# Patient Record
Sex: Female | Born: 1935 | Race: White | Hispanic: No | Smoking: Never smoker
Health system: Southern US, Community
[De-identification: ages and names within clinical notes are randomized; demographics above are authoritative.]

## PROBLEM LIST (undated history)

## (undated) DIAGNOSIS — E785 Hyperlipidemia, unspecified: Secondary | ICD-10-CM

## (undated) DIAGNOSIS — G2581 Restless legs syndrome: Secondary | ICD-10-CM

## (undated) DIAGNOSIS — I10 Essential (primary) hypertension: Secondary | ICD-10-CM

## (undated) DIAGNOSIS — Z96659 Presence of unspecified artificial knee joint: Secondary | ICD-10-CM

## (undated) DIAGNOSIS — E042 Nontoxic multinodular goiter: Secondary | ICD-10-CM

## (undated) DIAGNOSIS — I639 Cerebral infarction, unspecified: Secondary | ICD-10-CM

## (undated) DIAGNOSIS — I209 Angina pectoris, unspecified: Secondary | ICD-10-CM

## (undated) DIAGNOSIS — Z87898 Personal history of other specified conditions: Secondary | ICD-10-CM

## (undated) DIAGNOSIS — M8430XA Stress fracture, unspecified site, initial encounter for fracture: Secondary | ICD-10-CM

## (undated) DIAGNOSIS — R251 Tremor, unspecified: Secondary | ICD-10-CM

## (undated) DIAGNOSIS — K5792 Diverticulitis of intestine, part unspecified, without perforation or abscess without bleeding: Secondary | ICD-10-CM

## (undated) DIAGNOSIS — N8189 Other female genital prolapse: Secondary | ICD-10-CM

## (undated) DIAGNOSIS — L309 Dermatitis, unspecified: Secondary | ICD-10-CM

## (undated) DIAGNOSIS — M1710 Unilateral primary osteoarthritis, unspecified knee: Secondary | ICD-10-CM

## (undated) DIAGNOSIS — M7630 Iliotibial band syndrome, unspecified leg: Secondary | ICD-10-CM

## (undated) DIAGNOSIS — R7303 Prediabetes: Secondary | ICD-10-CM

## (undated) DIAGNOSIS — R5383 Other fatigue: Secondary | ICD-10-CM

## (undated) DIAGNOSIS — E119 Type 2 diabetes mellitus without complications: Secondary | ICD-10-CM

## (undated) DIAGNOSIS — R0600 Dyspnea, unspecified: Secondary | ICD-10-CM

## (undated) DIAGNOSIS — M25559 Pain in unspecified hip: Secondary | ICD-10-CM

## (undated) DIAGNOSIS — R51 Headache: Secondary | ICD-10-CM

## (undated) DIAGNOSIS — Z8711 Personal history of peptic ulcer disease: Secondary | ICD-10-CM

## (undated) DIAGNOSIS — F418 Other specified anxiety disorders: Secondary | ICD-10-CM

## (undated) DIAGNOSIS — M810 Age-related osteoporosis without current pathological fracture: Secondary | ICD-10-CM

## (undated) DIAGNOSIS — K449 Diaphragmatic hernia without obstruction or gangrene: Secondary | ICD-10-CM

## (undated) DIAGNOSIS — G4700 Insomnia, unspecified: Secondary | ICD-10-CM

## (undated) DIAGNOSIS — I499 Cardiac arrhythmia, unspecified: Secondary | ICD-10-CM

## (undated) DIAGNOSIS — I4891 Unspecified atrial fibrillation: Secondary | ICD-10-CM

## (undated) DIAGNOSIS — Z9889 Other specified postprocedural states: Secondary | ICD-10-CM

## (undated) DIAGNOSIS — N393 Stress incontinence (female) (male): Secondary | ICD-10-CM

## (undated) DIAGNOSIS — I82409 Acute embolism and thrombosis of unspecified deep veins of unspecified lower extremity: Secondary | ICD-10-CM

## (undated) DIAGNOSIS — Z8719 Personal history of other diseases of the digestive system: Secondary | ICD-10-CM

## (undated) DIAGNOSIS — R002 Palpitations: Secondary | ICD-10-CM

## (undated) DIAGNOSIS — H269 Unspecified cataract: Secondary | ICD-10-CM

## (undated) DIAGNOSIS — I2089 Other forms of angina pectoris: Secondary | ICD-10-CM

## (undated) DIAGNOSIS — N390 Urinary tract infection, site not specified: Secondary | ICD-10-CM

## (undated) DIAGNOSIS — C801 Malignant (primary) neoplasm, unspecified: Secondary | ICD-10-CM

## (undated) DIAGNOSIS — R339 Retention of urine, unspecified: Secondary | ICD-10-CM

## (undated) DIAGNOSIS — M1990 Unspecified osteoarthritis, unspecified site: Secondary | ICD-10-CM

## (undated) DIAGNOSIS — I2510 Atherosclerotic heart disease of native coronary artery without angina pectoris: Secondary | ICD-10-CM

## (undated) DIAGNOSIS — G43909 Migraine, unspecified, not intractable, without status migrainosus: Secondary | ICD-10-CM

## (undated) DIAGNOSIS — R197 Diarrhea, unspecified: Secondary | ICD-10-CM

## (undated) DIAGNOSIS — E1169 Type 2 diabetes mellitus with other specified complication: Secondary | ICD-10-CM

## (undated) DIAGNOSIS — I452 Bifascicular block: Secondary | ICD-10-CM

## (undated) DIAGNOSIS — Z9289 Personal history of other medical treatment: Secondary | ICD-10-CM

## (undated) DIAGNOSIS — K219 Gastro-esophageal reflux disease without esophagitis: Secondary | ICD-10-CM

## (undated) DIAGNOSIS — E782 Mixed hyperlipidemia: Secondary | ICD-10-CM

## (undated) DIAGNOSIS — I5032 Chronic diastolic (congestive) heart failure: Secondary | ICD-10-CM

## (undated) DIAGNOSIS — Z8541 Personal history of malignant neoplasm of cervix uteri: Secondary | ICD-10-CM

## (undated) DIAGNOSIS — I208 Other forms of angina pectoris: Secondary | ICD-10-CM

## (undated) DIAGNOSIS — N3946 Mixed incontinence: Secondary | ICD-10-CM

## (undated) DIAGNOSIS — R0609 Other forms of dyspnea: Secondary | ICD-10-CM

## (undated) DIAGNOSIS — R600 Localized edema: Secondary | ICD-10-CM

## (undated) DIAGNOSIS — Z0000 Encounter for general adult medical examination without abnormal findings: Secondary | ICD-10-CM

## (undated) DIAGNOSIS — I509 Heart failure, unspecified: Secondary | ICD-10-CM

## (undated) HISTORY — DX: Unilateral primary osteoarthritis, unspecified knee: M17.10

## (undated) HISTORY — DX: Encounter for general adult medical examination without abnormal findings: Z00.00

## (undated) HISTORY — DX: Headache: R51

## (undated) HISTORY — PX: JOINT REPLACEMENT: SHX530

## (undated) HISTORY — DX: Malignant (primary) neoplasm, unspecified: C80.1

## (undated) HISTORY — PX: UPPER GASTROINTESTINAL ENDOSCOPY: SHX188

## (undated) HISTORY — DX: Chronic diastolic (congestive) heart failure: I50.32

## (undated) HISTORY — DX: Acute embolism and thrombosis of unspecified deep veins of unspecified lower extremity: I82.409

## (undated) HISTORY — DX: Diarrhea, unspecified: R19.7

## (undated) HISTORY — DX: Insomnia, unspecified: G47.00

## (undated) HISTORY — DX: Age-related osteoporosis without current pathological fracture: M81.0

## (undated) HISTORY — DX: Other specified postprocedural states: Z98.890

## (undated) HISTORY — DX: Heart failure, unspecified: I50.9

## (undated) HISTORY — DX: Atherosclerotic heart disease of native coronary artery without angina pectoris: I25.10

## (undated) HISTORY — DX: Urinary tract infection, site not specified: N39.0

## (undated) HISTORY — PX: BRAIN SURGERY: SHX531

## (undated) HISTORY — DX: Restless legs syndrome: G25.81

## (undated) HISTORY — DX: Other specified anxiety disorders: F41.8

## (undated) HISTORY — PX: COLONOSCOPY: SHX174

## (undated) HISTORY — DX: Presence of unspecified artificial knee joint: Z96.659

## (undated) HISTORY — PX: KNEE ARTHROSCOPY: SUR90

## (undated) HISTORY — PX: FOOT SURGERY: SHX648

## (undated) HISTORY — DX: Type 2 diabetes mellitus with other specified complication: E11.69

## (undated) HISTORY — PX: CORONARY ANGIOPLASTY: SHX604

## (undated) HISTORY — DX: Pain in unspecified hip: M25.559

## (undated) HISTORY — PX: EYE SURGERY: SHX253

## (undated) HISTORY — PX: ABDOMINAL HYSTERECTOMY: SHX81

## (undated) HISTORY — PX: HERNIA REPAIR: SHX51

## (undated) HISTORY — DX: Dermatitis, unspecified: L30.9

## (undated) HISTORY — DX: Personal history of other diseases of the digestive system: Z87.19

## (undated) HISTORY — PX: FRACTURE SURGERY: SHX138

## (undated) HISTORY — DX: Unspecified cataract: H26.9

## (undated) HISTORY — DX: Hyperlipidemia, unspecified: E78.5

## (undated) HISTORY — DX: Stress fracture, unspecified site, initial encounter for fracture: M84.30XA

## (undated) HISTORY — PX: SMALL INTESTINE SURGERY: SHX150

## (undated) HISTORY — PX: CORONARY ANGIOPLASTY WITH STENT PLACEMENT: SHX49

## (undated) HISTORY — DX: Gastro-esophageal reflux disease without esophagitis: K21.9

## (undated) HISTORY — DX: Other fatigue: R53.83

## (undated) HISTORY — DX: Essential (primary) hypertension: I10

---

## 1986-07-09 HISTORY — PX: VAGINAL HYSTERECTOMY: SUR661

## 1987-07-10 HISTORY — PX: HIATAL HERNIA REPAIR: SHX195

## 1997-10-13 ENCOUNTER — Other Ambulatory Visit: Admission: RE | Admit: 1997-10-13 | Discharge: 1997-10-13 | Payer: Self-pay | Admitting: Obstetrics and Gynecology

## 1998-07-09 DIAGNOSIS — I639 Cerebral infarction, unspecified: Secondary | ICD-10-CM

## 1998-07-09 HISTORY — DX: Cerebral infarction, unspecified: I63.9

## 1999-03-21 ENCOUNTER — Ambulatory Visit (HOSPITAL_BASED_OUTPATIENT_CLINIC_OR_DEPARTMENT_OTHER): Admission: RE | Admit: 1999-03-21 | Discharge: 1999-03-21 | Payer: Self-pay | Admitting: Orthopedic Surgery

## 1999-06-09 DIAGNOSIS — Z8673 Personal history of transient ischemic attack (TIA), and cerebral infarction without residual deficits: Secondary | ICD-10-CM

## 1999-06-09 HISTORY — DX: Personal history of transient ischemic attack (TIA), and cerebral infarction without residual deficits: Z86.73

## 1999-06-21 ENCOUNTER — Encounter: Payer: Self-pay | Admitting: Emergency Medicine

## 1999-06-21 ENCOUNTER — Encounter: Payer: Self-pay | Admitting: Neurology

## 1999-06-21 ENCOUNTER — Inpatient Hospital Stay (HOSPITAL_COMMUNITY): Admission: EM | Admit: 1999-06-21 | Discharge: 1999-06-24 | Payer: Self-pay | Admitting: Emergency Medicine

## 1999-06-22 ENCOUNTER — Encounter: Payer: Self-pay | Admitting: Neurology

## 1999-08-14 ENCOUNTER — Ambulatory Visit (HOSPITAL_COMMUNITY): Admission: RE | Admit: 1999-08-14 | Discharge: 1999-08-14 | Payer: Self-pay | Admitting: Neurology

## 1999-08-14 ENCOUNTER — Encounter: Payer: Self-pay | Admitting: Neurology

## 1999-08-15 ENCOUNTER — Ambulatory Visit (HOSPITAL_COMMUNITY): Admission: RE | Admit: 1999-08-15 | Discharge: 1999-08-16 | Payer: Self-pay | Admitting: Neurology

## 1999-08-15 ENCOUNTER — Encounter: Payer: Self-pay | Admitting: Neurology

## 1999-12-27 ENCOUNTER — Other Ambulatory Visit: Admission: RE | Admit: 1999-12-27 | Discharge: 1999-12-27 | Payer: Self-pay | Admitting: Obstetrics and Gynecology

## 1999-12-29 ENCOUNTER — Encounter: Payer: Self-pay | Admitting: Obstetrics and Gynecology

## 1999-12-29 ENCOUNTER — Encounter: Admission: RE | Admit: 1999-12-29 | Discharge: 1999-12-29 | Payer: Self-pay | Admitting: Obstetrics and Gynecology

## 2000-01-03 ENCOUNTER — Encounter: Payer: Self-pay | Admitting: Obstetrics and Gynecology

## 2000-01-03 ENCOUNTER — Encounter: Admission: RE | Admit: 2000-01-03 | Discharge: 2000-01-03 | Payer: Self-pay | Admitting: Obstetrics and Gynecology

## 2000-03-21 ENCOUNTER — Ambulatory Visit (HOSPITAL_COMMUNITY): Admission: RE | Admit: 2000-03-21 | Discharge: 2000-03-21 | Payer: Self-pay | Admitting: Gastroenterology

## 2000-03-21 ENCOUNTER — Encounter: Payer: Self-pay | Admitting: Gastroenterology

## 2000-04-01 ENCOUNTER — Encounter: Payer: Self-pay | Admitting: Gastroenterology

## 2000-04-01 ENCOUNTER — Ambulatory Visit (HOSPITAL_COMMUNITY): Admission: RE | Admit: 2000-04-01 | Discharge: 2000-04-01 | Payer: Self-pay | Admitting: Gastroenterology

## 2000-05-27 ENCOUNTER — Ambulatory Visit (HOSPITAL_COMMUNITY): Admission: RE | Admit: 2000-05-27 | Discharge: 2000-05-27 | Payer: Self-pay | Admitting: Gastroenterology

## 2000-05-27 ENCOUNTER — Encounter: Payer: Self-pay | Admitting: Gastroenterology

## 2001-02-04 ENCOUNTER — Encounter: Admission: RE | Admit: 2001-02-04 | Discharge: 2001-02-04 | Payer: Self-pay | Admitting: Obstetrics and Gynecology

## 2001-02-04 ENCOUNTER — Encounter: Payer: Self-pay | Admitting: Obstetrics and Gynecology

## 2001-02-26 ENCOUNTER — Other Ambulatory Visit: Admission: RE | Admit: 2001-02-26 | Discharge: 2001-02-26 | Payer: Self-pay | Admitting: Obstetrics and Gynecology

## 2002-03-17 ENCOUNTER — Other Ambulatory Visit: Admission: RE | Admit: 2002-03-17 | Discharge: 2002-03-17 | Payer: Self-pay | Admitting: Gynecology

## 2002-03-27 ENCOUNTER — Encounter: Payer: Self-pay | Admitting: Gynecology

## 2002-03-27 ENCOUNTER — Encounter: Admission: RE | Admit: 2002-03-27 | Discharge: 2002-03-27 | Payer: Self-pay | Admitting: Gynecology

## 2002-12-18 ENCOUNTER — Encounter: Admission: RE | Admit: 2002-12-18 | Discharge: 2002-12-18 | Payer: Self-pay | Admitting: Family Medicine

## 2002-12-18 ENCOUNTER — Encounter: Payer: Self-pay | Admitting: Family Medicine

## 2003-04-09 ENCOUNTER — Encounter: Admission: RE | Admit: 2003-04-09 | Discharge: 2003-04-09 | Payer: Self-pay | Admitting: Gynecology

## 2003-04-09 ENCOUNTER — Encounter: Payer: Self-pay | Admitting: Gynecology

## 2003-04-10 ENCOUNTER — Ambulatory Visit (HOSPITAL_COMMUNITY): Admission: RE | Admit: 2003-04-10 | Discharge: 2003-04-10 | Payer: Self-pay | Admitting: Neurology

## 2003-04-10 ENCOUNTER — Encounter: Payer: Self-pay | Admitting: Neurology

## 2003-04-12 ENCOUNTER — Other Ambulatory Visit: Admission: RE | Admit: 2003-04-12 | Discharge: 2003-04-12 | Payer: Self-pay

## 2003-04-29 ENCOUNTER — Encounter: Admission: RE | Admit: 2003-04-29 | Discharge: 2003-05-24 | Payer: Self-pay | Admitting: Neurology

## 2004-07-09 DIAGNOSIS — Z955 Presence of coronary angioplasty implant and graft: Secondary | ICD-10-CM

## 2004-07-09 HISTORY — DX: Presence of coronary angioplasty implant and graft: Z95.5

## 2004-11-06 ENCOUNTER — Ambulatory Visit: Payer: Self-pay | Admitting: Internal Medicine

## 2005-01-24 ENCOUNTER — Other Ambulatory Visit: Admission: RE | Admit: 2005-01-24 | Discharge: 2005-01-24 | Payer: Self-pay | Admitting: Gynecology

## 2005-02-02 ENCOUNTER — Encounter: Admission: RE | Admit: 2005-02-02 | Discharge: 2005-02-02 | Payer: Self-pay | Admitting: Gynecology

## 2005-03-15 ENCOUNTER — Ambulatory Visit: Payer: Self-pay | Admitting: Internal Medicine

## 2005-03-16 ENCOUNTER — Inpatient Hospital Stay (HOSPITAL_COMMUNITY): Admission: EM | Admit: 2005-03-16 | Discharge: 2005-03-19 | Payer: Self-pay | Admitting: Emergency Medicine

## 2005-03-16 ENCOUNTER — Ambulatory Visit: Payer: Self-pay | Admitting: Cardiology

## 2005-03-28 ENCOUNTER — Ambulatory Visit: Payer: Self-pay | Admitting: Family Medicine

## 2005-04-06 ENCOUNTER — Ambulatory Visit: Payer: Self-pay | Admitting: Cardiology

## 2005-04-25 ENCOUNTER — Ambulatory Visit: Payer: Self-pay

## 2005-05-22 ENCOUNTER — Other Ambulatory Visit: Admission: RE | Admit: 2005-05-22 | Discharge: 2005-05-22 | Payer: Self-pay | Admitting: Gynecology

## 2005-06-19 ENCOUNTER — Ambulatory Visit: Payer: Self-pay | Admitting: Family Medicine

## 2005-06-26 ENCOUNTER — Ambulatory Visit: Payer: Self-pay | Admitting: *Deleted

## 2005-06-26 ENCOUNTER — Inpatient Hospital Stay (HOSPITAL_COMMUNITY): Admission: EM | Admit: 2005-06-26 | Discharge: 2005-06-28 | Payer: Self-pay | Admitting: Emergency Medicine

## 2005-07-12 ENCOUNTER — Ambulatory Visit: Payer: Self-pay | Admitting: Cardiology

## 2005-07-12 ENCOUNTER — Encounter (HOSPITAL_COMMUNITY): Admission: RE | Admit: 2005-07-12 | Discharge: 2005-10-10 | Payer: Self-pay | Admitting: Cardiology

## 2005-07-27 ENCOUNTER — Ambulatory Visit: Payer: Self-pay | Admitting: Cardiology

## 2005-09-03 ENCOUNTER — Ambulatory Visit: Payer: Self-pay | Admitting: Cardiology

## 2005-09-09 ENCOUNTER — Encounter: Admission: RE | Admit: 2005-09-09 | Discharge: 2005-09-09 | Payer: Self-pay | Admitting: Neurology

## 2005-10-11 ENCOUNTER — Encounter (HOSPITAL_COMMUNITY): Admission: RE | Admit: 2005-10-11 | Discharge: 2006-01-09 | Payer: Self-pay | Admitting: Cardiology

## 2005-10-23 ENCOUNTER — Ambulatory Visit: Payer: Self-pay | Admitting: Cardiology

## 2005-11-19 ENCOUNTER — Other Ambulatory Visit: Admission: RE | Admit: 2005-11-19 | Discharge: 2005-11-19 | Payer: Self-pay | Admitting: Gynecology

## 2006-04-01 ENCOUNTER — Encounter: Admission: RE | Admit: 2006-04-01 | Discharge: 2006-04-01 | Payer: Self-pay | Admitting: Gynecology

## 2006-04-08 ENCOUNTER — Ambulatory Visit: Payer: Self-pay | Admitting: Cardiology

## 2006-04-11 ENCOUNTER — Ambulatory Visit: Payer: Self-pay | Admitting: Cardiology

## 2006-04-23 ENCOUNTER — Ambulatory Visit: Payer: Self-pay | Admitting: Family Medicine

## 2006-05-07 ENCOUNTER — Other Ambulatory Visit: Admission: RE | Admit: 2006-05-07 | Discharge: 2006-05-07 | Payer: Self-pay | Admitting: Gynecology

## 2006-06-17 ENCOUNTER — Ambulatory Visit: Payer: Self-pay | Admitting: Cardiology

## 2006-10-28 ENCOUNTER — Ambulatory Visit: Payer: Self-pay | Admitting: Cardiology

## 2006-12-12 ENCOUNTER — Ambulatory Visit: Payer: Self-pay | Admitting: Cardiology

## 2006-12-12 LAB — CONVERTED CEMR LAB
BUN: 18 mg/dL (ref 6–23)
Calcium: 9.3 mg/dL (ref 8.4–10.5)
Chloride: 104 meq/L (ref 96–112)
Creatinine, Ser: 1 mg/dL (ref 0.4–1.2)
GFR calc non Af Amer: 58 mL/min
Magnesium: 1.5 mg/dL (ref 1.5–2.5)
TSH: 1.28 microintl units/mL (ref 0.35–5.50)

## 2007-04-14 DIAGNOSIS — I10 Essential (primary) hypertension: Secondary | ICD-10-CM | POA: Insufficient documentation

## 2007-04-14 DIAGNOSIS — K219 Gastro-esophageal reflux disease without esophagitis: Secondary | ICD-10-CM | POA: Insufficient documentation

## 2007-04-14 DIAGNOSIS — E785 Hyperlipidemia, unspecified: Secondary | ICD-10-CM | POA: Insufficient documentation

## 2007-04-14 DIAGNOSIS — Z8679 Personal history of other diseases of the circulatory system: Secondary | ICD-10-CM | POA: Insufficient documentation

## 2007-04-28 ENCOUNTER — Encounter: Admission: RE | Admit: 2007-04-28 | Discharge: 2007-04-28 | Payer: Self-pay | Admitting: Gynecology

## 2007-05-09 ENCOUNTER — Encounter: Payer: Self-pay | Admitting: Family Medicine

## 2007-05-12 ENCOUNTER — Other Ambulatory Visit: Admission: RE | Admit: 2007-05-12 | Discharge: 2007-05-12 | Payer: Self-pay | Admitting: Gynecology

## 2007-06-11 ENCOUNTER — Ambulatory Visit: Payer: Self-pay | Admitting: Cardiology

## 2007-10-16 ENCOUNTER — Ambulatory Visit: Payer: Self-pay | Admitting: Family Medicine

## 2007-10-16 DIAGNOSIS — R002 Palpitations: Secondary | ICD-10-CM | POA: Insufficient documentation

## 2007-10-16 DIAGNOSIS — L259 Unspecified contact dermatitis, unspecified cause: Secondary | ICD-10-CM | POA: Insufficient documentation

## 2007-10-16 DIAGNOSIS — R609 Edema, unspecified: Secondary | ICD-10-CM | POA: Insufficient documentation

## 2007-12-09 ENCOUNTER — Ambulatory Visit: Payer: Self-pay | Admitting: Cardiology

## 2007-12-17 ENCOUNTER — Encounter: Payer: Self-pay | Admitting: Family Medicine

## 2007-12-17 ENCOUNTER — Ambulatory Visit: Payer: Self-pay

## 2007-12-27 ENCOUNTER — Emergency Department (HOSPITAL_COMMUNITY): Admission: EM | Admit: 2007-12-27 | Discharge: 2007-12-27 | Payer: Self-pay | Admitting: Emergency Medicine

## 2007-12-27 ENCOUNTER — Telehealth: Payer: Self-pay | Admitting: Internal Medicine

## 2008-04-29 ENCOUNTER — Encounter: Admission: RE | Admit: 2008-04-29 | Discharge: 2008-04-29 | Payer: Self-pay | Admitting: Gynecology

## 2008-05-07 ENCOUNTER — Encounter: Payer: Self-pay | Admitting: Family Medicine

## 2008-07-09 DIAGNOSIS — Z87898 Personal history of other specified conditions: Secondary | ICD-10-CM

## 2008-07-09 HISTORY — DX: Personal history of other specified conditions: Z87.898

## 2008-08-02 ENCOUNTER — Ambulatory Visit: Payer: Self-pay | Admitting: Cardiovascular Disease

## 2008-08-03 ENCOUNTER — Ambulatory Visit: Payer: Self-pay | Admitting: Cardiovascular Disease

## 2008-08-04 ENCOUNTER — Ambulatory Visit: Payer: Self-pay | Admitting: Cardiovascular Disease

## 2008-08-04 LAB — CONVERTED CEMR LAB
ALT: 24 units/L (ref 0–35)
AST: 21 units/L (ref 0–37)
Direct LDL: 135.4 mg/dL
HDL: 48.8 mg/dL (ref >39.0)
Total Protein: 6.5 g/dL (ref 6.0–8.3)
Triglycerides: 134 mg/dL (ref 0–149)

## 2008-08-24 ENCOUNTER — Ambulatory Visit: Payer: Self-pay | Admitting: Pulmonary Disease

## 2008-09-09 DIAGNOSIS — G4700 Insomnia, unspecified: Secondary | ICD-10-CM | POA: Insufficient documentation

## 2008-09-14 ENCOUNTER — Ambulatory Visit: Payer: Self-pay | Admitting: Pulmonary Disease

## 2008-11-02 ENCOUNTER — Telehealth: Payer: Self-pay | Admitting: Cardiovascular Disease

## 2009-01-18 DIAGNOSIS — R5381 Other malaise: Secondary | ICD-10-CM | POA: Insufficient documentation

## 2009-01-18 DIAGNOSIS — G2581 Restless legs syndrome: Secondary | ICD-10-CM | POA: Insufficient documentation

## 2009-01-18 DIAGNOSIS — I2510 Atherosclerotic heart disease of native coronary artery without angina pectoris: Secondary | ICD-10-CM | POA: Insufficient documentation

## 2009-01-18 DIAGNOSIS — R5383 Other fatigue: Secondary | ICD-10-CM

## 2009-01-24 ENCOUNTER — Ambulatory Visit: Payer: Self-pay | Admitting: Cardiovascular Disease

## 2009-01-26 LAB — CONVERTED CEMR LAB
ALT: 24 units/L (ref 0–35)
Alkaline Phosphatase: 83 units/L (ref 39–117)
Bilirubin, Direct: 0.1 mg/dL (ref 0.0–0.3)
Cholesterol: 139 mg/dL (ref 0–200)
LDL Cholesterol: 63 mg/dL (ref 0–99)
Total Bilirubin: 1 mg/dL (ref 0.3–1.2)
Total Protein: 6.9 g/dL (ref 6.0–8.3)
VLDL: 27.4 mg/dL (ref 0.0–40.0)

## 2009-04-15 ENCOUNTER — Encounter (INDEPENDENT_AMBULATORY_CARE_PROVIDER_SITE_OTHER): Payer: Self-pay | Admitting: *Deleted

## 2009-05-02 ENCOUNTER — Encounter: Admission: RE | Admit: 2009-05-02 | Discharge: 2009-05-02 | Payer: Self-pay | Admitting: Gynecology

## 2009-05-13 ENCOUNTER — Encounter (INDEPENDENT_AMBULATORY_CARE_PROVIDER_SITE_OTHER): Payer: Self-pay | Admitting: *Deleted

## 2009-06-27 ENCOUNTER — Ambulatory Visit: Payer: Self-pay | Admitting: Cardiovascular Disease

## 2009-06-27 ENCOUNTER — Telehealth: Payer: Self-pay | Admitting: Cardiovascular Disease

## 2009-06-29 LAB — CONVERTED CEMR LAB
BUN: 26 mg/dL — ABNORMAL HIGH (ref 6–23)
Chloride: 103 meq/L (ref 96–112)
Creatinine, Ser: 1.2 mg/dL (ref 0.4–1.2)
Glucose, Bld: 155 mg/dL — ABNORMAL HIGH (ref 70–99)
Magnesium: 1.3 mg/dL — ABNORMAL LOW (ref 1.5–2.5)
Potassium: 4.1 meq/L (ref 3.5–5.1)

## 2009-07-07 ENCOUNTER — Ambulatory Visit: Payer: Self-pay | Admitting: Cardiovascular Disease

## 2009-07-07 ENCOUNTER — Encounter: Payer: Self-pay | Admitting: Cardiology

## 2009-07-11 ENCOUNTER — Telehealth: Payer: Self-pay | Admitting: Cardiovascular Disease

## 2009-07-13 ENCOUNTER — Ambulatory Visit: Payer: Self-pay | Admitting: Family Medicine

## 2009-07-13 DIAGNOSIS — J209 Acute bronchitis, unspecified: Secondary | ICD-10-CM | POA: Insufficient documentation

## 2009-07-13 DIAGNOSIS — J069 Acute upper respiratory infection, unspecified: Secondary | ICD-10-CM | POA: Insufficient documentation

## 2009-08-01 ENCOUNTER — Ambulatory Visit: Payer: Self-pay | Admitting: Cardiovascular Disease

## 2009-08-02 ENCOUNTER — Telehealth (INDEPENDENT_AMBULATORY_CARE_PROVIDER_SITE_OTHER): Payer: Self-pay | Admitting: *Deleted

## 2009-08-03 ENCOUNTER — Ambulatory Visit: Payer: Self-pay

## 2009-08-03 ENCOUNTER — Ambulatory Visit: Payer: Self-pay | Admitting: Cardiology

## 2009-08-03 ENCOUNTER — Encounter (HOSPITAL_COMMUNITY): Admission: RE | Admit: 2009-08-03 | Discharge: 2009-10-12 | Payer: Self-pay | Admitting: Obstetrics and Gynecology

## 2009-08-04 LAB — CONVERTED CEMR LAB
BUN: 30 mg/dL — ABNORMAL HIGH (ref 6–23)
Chloride: 103 meq/L (ref 96–112)
Creatinine, Ser: 1.1 mg/dL (ref 0.4–1.2)
Glucose, Bld: 128 mg/dL — ABNORMAL HIGH (ref 70–99)
Potassium: 3.9 meq/L (ref 3.5–5.1)

## 2009-08-23 ENCOUNTER — Encounter: Payer: Self-pay | Admitting: Cardiovascular Disease

## 2009-09-07 ENCOUNTER — Encounter: Admission: RE | Admit: 2009-09-07 | Discharge: 2009-09-07 | Payer: Self-pay | Admitting: Orthopedic Surgery

## 2009-09-12 ENCOUNTER — Ambulatory Visit (HOSPITAL_BASED_OUTPATIENT_CLINIC_OR_DEPARTMENT_OTHER): Admission: RE | Admit: 2009-09-12 | Discharge: 2009-09-12 | Payer: Self-pay | Admitting: Orthopedic Surgery

## 2009-10-03 ENCOUNTER — Encounter: Payer: Self-pay | Admitting: Family Medicine

## 2010-02-28 ENCOUNTER — Ambulatory Visit: Payer: Self-pay | Admitting: Cardiovascular Disease

## 2010-03-06 ENCOUNTER — Telehealth: Payer: Self-pay | Admitting: Cardiovascular Disease

## 2010-03-07 ENCOUNTER — Ambulatory Visit: Payer: Self-pay | Admitting: Cardiovascular Disease

## 2010-03-07 DIAGNOSIS — I2510 Atherosclerotic heart disease of native coronary artery without angina pectoris: Secondary | ICD-10-CM | POA: Insufficient documentation

## 2010-03-07 DIAGNOSIS — E7800 Pure hypercholesterolemia, unspecified: Secondary | ICD-10-CM | POA: Insufficient documentation

## 2010-03-14 LAB — CONVERTED CEMR LAB
Alkaline Phosphatase: 84 units/L (ref 39–117)
BUN: 31 mg/dL — ABNORMAL HIGH (ref 6–23)
Bilirubin, Direct: 0.1 mg/dL (ref 0.0–0.3)
CO2: 27 meq/L (ref 19–32)
Chloride: 104 meq/L (ref 96–112)
Creatinine, Ser: 1 mg/dL (ref 0.4–1.2)
Glucose, Bld: 148 mg/dL — ABNORMAL HIGH (ref 70–99)
LDL Cholesterol: 76 mg/dL (ref 0–99)
Potassium: 4.3 meq/L (ref 3.5–5.1)
Total Bilirubin: 0.7 mg/dL (ref 0.3–1.2)
Total CHOL/HDL Ratio: 3
VLDL: 30.6 mg/dL (ref 0.0–40.0)

## 2010-05-19 ENCOUNTER — Encounter: Payer: Self-pay | Admitting: Family Medicine

## 2010-05-23 ENCOUNTER — Encounter: Admission: RE | Admit: 2010-05-23 | Discharge: 2010-05-23 | Payer: Self-pay | Admitting: Gynecology

## 2010-05-24 ENCOUNTER — Encounter: Admission: RE | Admit: 2010-05-24 | Discharge: 2010-05-24 | Payer: Self-pay | Admitting: Gastroenterology

## 2010-05-24 ENCOUNTER — Telehealth (INDEPENDENT_AMBULATORY_CARE_PROVIDER_SITE_OTHER): Payer: Self-pay | Admitting: *Deleted

## 2010-05-29 ENCOUNTER — Encounter: Payer: Self-pay | Admitting: Family Medicine

## 2010-06-07 ENCOUNTER — Encounter: Payer: Self-pay | Admitting: Family Medicine

## 2010-06-12 ENCOUNTER — Encounter: Payer: Self-pay | Admitting: Cardiovascular Disease

## 2010-06-29 ENCOUNTER — Ambulatory Visit: Payer: Self-pay | Admitting: Internal Medicine

## 2010-08-10 NOTE — Letter (Signed)
Summary: [facility]  [facility]   Imported By: Marylou Mccoy 01/24/2010 14:03:17  _____________________________________________________________________  External Attachment:    Type:   Image     Comment:   External Document

## 2010-08-10 NOTE — Progress Notes (Signed)
Summary: PALPITATIONS   Phone Note Call from Patient Call back at [facility] Phone [phone]   Caller: Patient Summary of Call: PT IS HAVING PALP. Initial call taken by: Judie Grieve,  July 11, 2009 4:45 PM  Follow-up for Phone Call        I spoke with the pt and she is still having palpitations.  The pt said they have gotten worse and she cannot lay on her left side.  The pt did turn in her heart monitor today.  The results of the pt's monitor are still pending.  I will obtain these results on 07/12/09. Follow-up by: Julieta Gutting, RN, BSN,  July 11, 2009 6:00 PM  Additional Follow-up for Phone Call Additional follow up Details #1::        Dr Excell Seltzer reviewed the pt's heart monitor.  NSR with frequent Supraventricular ectopies and short runs of SVT. Rare PVC's.  Recommend add Toprol XL 50mg  every evening.   I spoke with the pt and monitor results were reviewed.  The pt will take Toprol XL 100mg  every AM and 50mg  every PM.  The pt will call back if her palpitations do not improve.  The pt will continue to monitor her BP and HR at home. Additional Follow-up by: Julieta Gutting, RN, BSN,  July 12, 2009 10:40 AM    New/Updated Medications: METOPROLOL SUCCINATE 100 MG XR24H-TAB (METOPROLOL SUCCINATE) Take one tablet by mouth every morning and one-half tablet by mouth every evening

## 2010-08-10 NOTE — Assessment & Plan Note (Signed)
Summary: FLU SHOT//SLM  Nurse Visit   Allergies: No Known Drug Allergies  Immunizations Administered:  Influenza Vaccine # 1:    Vaccine Type: Fluvax 3+    Site: left deltoid    Mfr: Sanofi Pasteur    Dose: 0.5 ml    Route: IM    Given by: Kyung Rudd, CMA    Exp. Date: 01/06/2011    Lot #: [id number]  Orders Added: 1)  Flu Vaccine 4yrs + [45409] 2)  Admin 1st Vaccine [81191]

## 2010-08-10 NOTE — Progress Notes (Signed)
Summary: Nuclear Pre-Procedure   Nuclear Med Background Indications for Stress Test: Evaluation for Ischemia, Stent Patency   History: Abnormal EKG, Echo, Heart Catheterization, Myocardial Infarction, Myocardial Perfusion Study, Stents  History Comments: 09/06 MI NSTEMI Stent-CFX 09/06 STENTS LCFX 10/06 ECHO EF 55-65% 12/06 HEART Cath  Residual moderate LAD patent Stent to RCA 12/06 STENT RCA  Abnormal EKG: [facility] '00 TIA  Symptoms: Chest Pain, Chest Pain with Exertion, DOE, Palpitations    Nuclear Pre-Procedure Cardiac Risk Factors: Family History - CAD, Hypertension, Lipids, TIA Height (in): 63  Nuclear Med Study Referring MD:  M.Cooper

## 2010-08-10 NOTE — Procedures (Signed)
Summary:  Endoscopy / [facility]  [facility]   Imported By: Lennie Odor 06/27/2010 11:47:33  _____________________________________________________________________  External Attachment:    Type:   Image     Comment:   External Document  Appended Document:  Endoscopy / [facility] reviewed

## 2010-08-10 NOTE — Progress Notes (Signed)
Summary: pls clarify dosage/ refill   Phone Note Refill Request Call back at Home Phone [phone] Message from:  Patient on March 06, 2010 10:16 AM  Refills Requested: Medication #1:  METOPROLOL SUCCINATE 100 MG XR24H-TAB Take one tablet by mouth twice a day also [facility] [address] [facility] day supply.    Method Requested: Fax to Local Pharmacy Initial call taken by: Lorne Skeens,  March 06, 2010 10:18 AM Caller: Patient Reason for Call: Talk to Nurse Summary of Call: pls clarify dosage with carmek. METOPROLOL SUCCINATE 100 MG XR24H-TAB Take one tablet by mouth twice a day  Follow-up for Phone Call        Local Rx sent into the pharmacy per the pt's request.  Correct Rx instructions sent into [facility] on 03/02/10. No contact number left for [employer].  I attempted to contact the pt about her message but the number sounds like a fax machine.  Follow-up by: Julieta Gutting, RN, BSN,  March 06, 2010 4:14 PM    Prescriptions: METOPROLOL SUCCINATE 100 MG XR24H-TAB (METOPROLOL SUCCINATE) Take one tablet by mouth twice a day  #60 x 1   Entered by:   Julieta Gutting, RN, BSN   Authorized by:   Norva Karvonen, MD   Signed by:   Julieta Gutting, RN, BSN on 03/06/2010   Method used:   [facility]  [address] [id number]       [address]       [place], [place]  [address]       Ph: [phone] or [phone]       Fax: [phone]   RxID:   [id number]

## 2010-08-10 NOTE — Assessment & Plan Note (Signed)
Summary: Cardiology Nuclear Study  Nuclear Med Background Indications for Stress Test: Evaluation for Ischemia, Stent Patency   History: Abnormal EKG, Echo, Heart Catheterization, Myocardial Infarction, Myocardial Perfusion Study, Stents  History Comments: 09/06 NSTEMI>Stent-CFX; 12/06 Stent-RCA, CFX stent patent; '06 ECHO:EF= 55-65%  Symptoms: Chest Pain, Chest Pain with Exertion, DOE, Fatigue, Palpitations, Rapid HR    Nuclear Pre-Procedure Cardiac Risk Factors: Family History - CAD, Hypertension, Lipids, TIA Caffeine/Decaff Intake: None NPO After: 6:30 PM Lungs: Clear IV 0.9% NS with Angio Cath: 22g     IV Site: (R) Hand IV Started by: Irean Hong RN Chest Size (in) 38     Cup Size C     Height (in): 63 Weight (lb): 173 BMI: 30.76  Nuclear Med Study 1 or 2 day study:  1 day     Stress Test Type:  Eugenie Birks Reading MD:  Olga Millers, MD     Referring MD:  Tonny Bollman, MD Resting Radionuclide:  Technetium 45m Tetrofosmin     Resting Radionuclide Dose:  10.4 mCi  Stress Radionuclide:  Technetium 61m Tetrofosmin     Stress Radionuclide Dose:  32.0 mCi   Stress Protocol   Lexiscan: 0.4 mg   Stress Test Technologist:  Rea College CMA-N     Nuclear Technologist:  Burna Mortimer Deal RT-N  Rest Procedure  Myocardial perfusion imaging was performed at rest 45 minutes following the intravenous administration of Myoview Technetium 40m Tetrofosmin.  Stress Procedure  The patient received IV Lexiscan 0.4 mg over 15-seconds.  Myoview injected at 30-seconds.  There were no significant changes with infusion, frequent PAC's, as noted on baseline.  Quantitative spect images were obtained after a 45 minute delay.  QPS Raw Data Images:  There is interference from nuclear activity from structures below the diaphragm.  This does not affect the ability to read the study. Stress Images:  There is normal uptake in all areas. Rest Images:  Normal homogeneous uptake in all areas of the  myocardium. Subtraction (SDS):  No evidence of ischemia. Transient Ischemic Dilatation:  .94  (Normal <1.22)  Lung/Heart Ratio:  .28  (Normal <0.45)  Quantitative Gated Spect Images QGS EDV:  67 ml QGS ESV:  20 ml QGS EF:  70 % QGS cine images:  Normal wall motion.   Overall Impression  Exercise Capacity: Lexiscan study with no exercise. BP Response: Normal blood pressure response. Clinical Symptoms: No chest pain ECG Impression: No significant ST segment change suggestive of ischemia. Overall Impression: There is no sign of scar or ischemia.  Appended Document: Cardiology Nuclear Study Pt aware of results by phone.

## 2010-08-10 NOTE — Progress Notes (Signed)
Summary: Records Request  Faxed OV, EKG & Stress to Jodie at [facility] ([phone]). Debby Freiberg  May 24, 2010 6:08 PM

## 2010-08-10 NOTE — Letter (Signed)
[facility]   Imported By: Maryln Gottron 06/09/2010 11:16:02  _____________________________________________________________________  External Attachment:    Type:   Image     Comment:   External Document

## 2010-08-10 NOTE — Procedures (Signed)
Summary: summary report  summary report   Imported By: Mirna Mires 07/12/2009 14:42:29  _____________________________________________________________________  External Attachment:    Type:   Image     Comment:   External Document

## 2010-08-10 NOTE — Assessment & Plan Note (Signed)
Summary: SINUS PROBLEMS, COUGH // RS   Vital Signs:  Patient profile:   76 year old female Weight:      176 pounds BMI:     31.29 O2 Sat:      96 % Temp:     97.7 degrees F Pulse rate:   78 / minute BP sitting:   140 / 80  (left arm)  Vitals Entered By: Pura Spice, RN (July 13, 2009 10:58 AM)  Nutrition Counseling: Patient's BMI is greater than 25 and therefore counseled on weight management options. CC: cough congestion green ongoing since before Christmas    History of Present Illness: This 76 year old white female who relates a history of head and gastroenteritis approximately 2 weeks ago with nausea vomiting and some diarrhea but then improved. 4 days previously she began having nasal congestion drainage from her nose and postnasal drainage and then also onset of cough which has been productive cough of discolored sputum here and has general mild A. and some aching and Is not aware of as to whether she's had any fever or not. Hypertension is controlled, she mentioned patient has frequent problems with premature ventricular contractions and would be advisable not to utilize any pseudoephedrine Normal   Allergies: No Known Drug Allergies  Past History:  Past Medical History: Last updated: 01/18/2009 Current Problems:  CAD (ICD-414.00)- post percutaneous coronary intervention in 2006. TRANSIENT ISCHEMIC ATTACK, HX OF (ICD-V12.50) HYPERTENSION (ICD-401.9) HYPERLIPIDEMIA (ICD-272.4) PERIPHERAL EDEMA (ICD-782.3) PALPITATIONS (ICD-785.1) RESTLESS LEGS SYNDROME (ICD-333.94) FATIGUE (ICD-780.79) GERD (ICD-530.81) PERSISTENT DISORDER INITIATING/MAINTAINING SLEEP (ICD-307.42) DERMATITIS (ICD-692.9)  Past Surgical History: Last updated: 01/18/2009 Hysterectomy Inguinal herniorrhaphy Bil Knee Surgery  stenting of the  left circumflex and right coronary artery in 2006  Social History: Last updated: 01/18/2009 Pt works part-time in [employer]. Married Never  Smoked Alcohol use-no Drug use-no Regular exercise-no  Risk Factors: Smoking Status: never (04/14/2007)  Review of Systems  The patient denies anorexia, fever, weight loss, weight gain, vision loss, decreased hearing, hoarseness, chest pain, syncope, dyspnea on exertion, peripheral edema, prolonged cough, headaches, hemoptysis, abdominal pain, melena, hematochezia, severe indigestion/heartburn, hematuria, incontinence, genital sores, muscle weakness, suspicious skin lesions, transient blindness, difficulty walking, depression, unusual weight change, abnormal bleeding, enlarged lymph nodes, angioedema, breast masses, and testicular masses.    Physical Exam  General:  Well-developed,well-nourished,in no acute distress; alert,appropriate and cooperative throughout examination Head:  Normocephalic and atraumatic without obvious abnormalities. No apparent alopecia or balding. Eyes:  No corneal or conjunctival inflammation noted. EOMI. Perrla. Funduscopic exam benign, without hemorrhages, exudates or papilledema. Vision grossly normal. Ears:  right tympanic membrane dull not clear if they most otherwise negative ear exam Nose:  erythematous nasal mucosa with edema and drainage which is discolored Mouth:  pharyngeal erythema.   Lungs:  rock out bilaterally no dullness no rales no wheeze and Heart:  occasional PVC otherwise negative Abdomen:  Bowel sounds positive,abdomen soft and non-tender without masses, organomegaly or hernias noted.   Impression & Recommendations:  Problem # 1:  ACUTE BRONCHITIS (ICD-466.0) Assessment New  Her updated medication list for this problem includes:    Zithromax Z-pak 250 Mg Tabs (Azithromycin) .Marland Kitchen... 2 stat then 1 per day    Hydromet 5-1.5 Mg/45ml Syrp (Hydrocodone-homatropine) .Marland Kitchen... 1-2 tsp q4h as needed cpough  Orders: Prescription Created Electronically [phone])  Problem # 2:  URI (ICD-465.9) Assessment: New  Her updated medication list for this  problem includes:    Bayer Aspirin 325 Mg Tabs (Aspirin) .Marland Kitchen... Take 1 tablet by mouth  once a day    Hydromet 5-1.5 Mg/15ml Syrp (Hydrocodone-homatropine) .Marland Kitchen... 1-2 tsp q4h as needed cpough  Problem # 3:  CAD (ICD-414.00) Assessment: Unchanged  Her updated medication list for this problem includes:    Plavix 75 Mg Tabs (Clopidogrel bisulfate) .Marland Kitchen... Take 1 tablet by mouth once a day    Amlodipine Besylate 10 Mg Tabs (Amlodipine besylate) .Marland Kitchen... Take 1 tablet by mouth once a day    Hyzaar 100-25 Mg Tabs (Losartan potassium-hctz) .Marland Kitchen... 1 once daily    Bayer Aspirin 325 Mg Tabs (Aspirin) .Marland Kitchen... Take 1 tablet by mouth once a day    Isosorbide Mononitrate Cr 30 Mg Xr24h-tab (Isosorbide mononitrate) .Marland Kitchen... Take 1 tablet by mouth once a day    Nitroglycerin 0.4 Mg Subl (Nitroglycerin) .Marland Kitchen... Place 1 tablet under tongue as directed    Metoprolol Succinate 100 Mg Xr24h-tab (Metoprolol succinate) .Marland Kitchen... Take one tablet by mouth every morning and one-half tablet by mouth every evening  Problem # 4:  HYPERTENSION (ICD-401.9) Assessment: Improved  Her updated medication list for this problem includes:    Amlodipine Besylate 10 Mg Tabs (Amlodipine besylate) .Marland Kitchen... Take 1 tablet by mouth once a day    Hyzaar 100-25 Mg Tabs (Losartan potassium-hctz) .Marland Kitchen... 1 once daily    Metoprolol Succinate 100 Mg Xr24h-tab (Metoprolol succinate) .Marland Kitchen... Take one tablet by mouth every morning and one-half tablet by mouth every evening  Problem # 5:  HYPERLIPIDEMIA (ICD-272.4) Assessment: Unchanged  Her updated medication list for this problem includes:    Simvastatin 40 Mg Tabs (Simvastatin) .Marland Kitchen... Take one tablet by mouth daily at bedtime  Problem # 6:  PALPITATIONS (ICD-785.1) Assessment: Unchanged  Her updated medication list for this problem includes:    Metoprolol Succinate 100 Mg Xr24h-tab (Metoprolol succinate) .Marland Kitchen... Take one tablet by mouth every morning and one-half tablet by mouth every evening  Complete  Medication List: 1)  Plavix 75 Mg Tabs (Clopidogrel bisulfate) .... Take 1 tablet by mouth once a day 2)  Amlodipine Besylate 10 Mg Tabs (Amlodipine besylate) .... Take 1 tablet by mouth once a day 3)  Hyzaar 100-25 Mg Tabs (Losartan potassium-hctz) .Marland Kitchen.. 1 once daily 4)  Mirapex 0.25 Mg Tabs (Pramipexole dihydrochloride) .... Take 2 tablets daily 5)  Prilosec 40 Mg Cpdr (Omeprazole) .... Take 1 tablet by mouth once a day 6)  Bayer Aspirin 325 Mg Tabs (Aspirin) .... Take 1 tablet by mouth once a day 7)  Magnesium Oxide 400 Mg Tabs (Magnesium oxide) .... Take 1 tablet by mouth three times a day 8)  Isosorbide Mononitrate Cr 30 Mg Xr24h-tab (Isosorbide mononitrate) .... Take 1 tablet by mouth once a day 9)  Nitroglycerin 0.4 Mg Subl (Nitroglycerin) .... Place 1 tablet under tongue as directed 10)  Potassium Chloride Crys Cr 20 Meq Cr-tabs (Potassium chloride crys cr) .... Take 1 tablet by mouth once a day 11)  Simvastatin 40 Mg Tabs (Simvastatin) .... Take one tablet by mouth daily at bedtime 12)  Metoprolol Succinate 100 Mg Xr24h-tab (Metoprolol succinate) .... Take one tablet by mouth every morning and one-half tablet by mouth every evening 13)  Zithromax Z-pak 250 Mg Tabs (Azithromycin) .... 2 stat then 1 per day 14)  Hydromet 5-1.5 Mg/87ml Syrp (Hydrocodone-homatropine) .Marland Kitchen.. 1-2 tsp q4h as needed cpough  Patient Instructions: 1)  acute uri and bronchitis 2)  has problem with pvds but cannot tx with decongestants 3)  Z-Pak for infection 4)  He is admitted Mucinex DM as instructed 5)  Hydromet for cough control  Prescriptions: HYZAAR 100-25 MG  TABS (LOSARTAN POTASSIUM-HCTZ) 1 once daily  #90 x 3   Entered and Authorized by:   Judithann Sheen MD   Signed by:   Judithann Sheen MD on 07/13/2009   Method used:   [facility]  [address] [id number]       [address]       [place], [place]  [address]       Ph: [phone] or [phone]        Fax: [phone]   RxID:   [id number] ZITHROMAX Z-PAK 250 MG TABS (AZITHROMYCIN) 2 stat then 1 per day  #1 pkge x 1   Entered and Authorized by:   Judithann Sheen MD   Signed by:   Judithann Sheen MD on 07/13/2009   Method used:   [facility]        [facility]  [address] [id number]       [address]       [place], [place]  [address]       Ph: [phone] or [phone]       Fax: [phone]   RxID:   [id number] HYDROMET 5-1.5 MG/5ML SYRP (HYDROCODONE-HOMATROPINE) 1-2 tsp q4h as needed cpough  #240 x 1   Entered and Authorized by:   Judithann Sheen MD   Signed by:   Judithann Sheen MD on 07/13/2009   Method used:   Print then Give to Patient   RxID:   [id number] ZITHROMAX Z-PAK 250 MG TABS (AZITHROMYCIN) 2 stat then 1 per day  #1 pkge x 0   Entered and Authorized by:   Judithann Sheen MD   Signed by:   Judithann Sheen MD on 07/13/2009   Method used:   Electronically to        [facility] [phone]* (retail)       [address]       [place], [place]  [address]       Ph: [phone]       Fax: [phone]   RxID:   [id number]

## 2010-08-10 NOTE — Assessment & Plan Note (Signed)
Summary: Heather [name]   Visit Type:  6 months follow up Referring Provider:  Dr Excell Seltzer Primary Provider:  Dr [name] Limbo  CC:  palpitations.  History of Present Illness: 76 year-old woman with hx of CAD who underwent PCI of the LCx and RCA with drug-eluting stents in 2006. She has done well since that time from a cardiac standpoint. She denies chest pain, dyspnea, orthopnea, or PND. Exercise is limited by knee pain. She is planning on have knee replacement in the near future by Dr Thurston Hole.  Developed palpitations over the past month. Symptoms are most noticeable at rest. She has had similar symptoms in the past prior to her most recent PCI procedure. She also reports episodic chest pains, both at rest and with exertion. These feel 'sharp' and are mild in intensity. Occasional shortness of breath. No edema, orthopnea, or PND.  Current Medications (verified): 1)  Plavix 75 Mg  Tabs (Clopidogrel Bisulfate) .... Take 1 Tablet By Mouth Once A Day 2)  Amlodipine Besylate 10 Mg Tabs (Amlodipine Besylate) .... Take 1 Tablet By Mouth Once A Day 3)  Hyzaar 100-25 Mg  Tabs (Losartan Potassium-Hctz) .Marland Kitchen.. 1 Once Daily 4)  Mirapex 0.25 Mg  Tabs (Pramipexole Dihydrochloride) .... Take 2 Tablets Daily 5)  Prilosec 40 Mg Cpdr (Omeprazole) .... Take 1 Tablet By Mouth Once A Day 6)  Bayer Aspirin 325 Mg  Tabs (Aspirin) .... Take 1 Tablet By Mouth Once A Day 7)  Magnesium Oxide 400 Mg Tabs (Magnesium Oxide) .... Take 1 Tablet By Mouth Three Times A Day 8)  Isosorbide Mononitrate Cr 30 Mg Xr24h-Tab (Isosorbide Mononitrate) .... Take 1 Tablet By Mouth Once A Day 9)  Nitroglycerin 0.4 Mg Subl (Nitroglycerin) .... Place 1 Tablet Under Tongue As Directed 10)  Potassium Chloride Crys Cr 20 Meq Cr-Tabs (Potassium Chloride Crys Cr) .... Take 1 Tablet By Mouth Once A Day 11)  Simvastatin 40 Mg Tabs (Simvastatin) .... Take One Tablet By Mouth Daily At Bedtime 12)  Metoprolol Succinate 100 Mg Xr24h-Tab (Metoprolol Succinate)  .... Take One Tablet By Mouth Every Morning and One-Half Tablet By Mouth Every Evening 13)  Hydromet 5-1.5 Mg/28ml Syrp (Hydrocodone-Homatropine) .Marland Kitchen.. 1-2 Tsp Q4h As Needed Cpough  Allergies (verified): No Known Drug Allergies  Past History:  Past medical history reviewed for relevance to current acute and chronic problems.  Past Medical History: Reviewed history from 01/18/2009 and no changes required. Current Problems:  CAD (ICD-414.00)- post percutaneous coronary intervention in 2006. TRANSIENT ISCHEMIC ATTACK, HX OF (ICD-V12.50) HYPERTENSION (ICD-401.9) HYPERLIPIDEMIA (ICD-272.4) PERIPHERAL EDEMA (ICD-782.3) PALPITATIONS (ICD-785.1) RESTLESS LEGS SYNDROME (ICD-333.94) FATIGUE (ICD-780.79) GERD (ICD-530.81) PERSISTENT DISORDER INITIATING/MAINTAINING SLEEP (ICD-307.42) DERMATITIS (ICD-692.9)  Review of Systems       Positive for left knee pain, otherwise negative except as per HPI.  Vital Signs:  Patient profile:   76 year old female Height:      63 inches Weight:      175.50 pounds BMI:     31.20 Pulse rate:   62 / minute Pulse rhythm:   regular Resp:     18 per minute BP sitting:   122 / 68  (left arm) Cuff size:   large  Vitals Entered By: Vikki Ports (August 01, 2009 10:51 AM)  Physical Exam  General:  Pt is alert and oriented, in no acute distress. HEENT: normal Neck: normal carotid upstrokes without bruits, JVP normal Lungs: CTA CV: RRR without murmur or gallop Abd: soft, NT, positive BS, no bruit, no organomegaly Ext: no clubbing,  cyanosis, or edema. peripheral pulses 2+ and equal Skin: warm and dry without rash    EKG  Procedure date:  08/01/2009  Findings:      NSR, left axis, borderline for LVH, HR 63 bpm.  Impression & Recommendations:  Problem # 1:  CAD (ICD-414.00) Pt wiht palpitations and chest pain, similar to symptoms prior to last PCI procedure. Chest pain is atypical. Would assess her risk as intermedicate and recommend Myoview  stress scan to rule out significant ischemia. Continue antiplatelet Rx with ASA and Plavix.  Her updated medication list for this problem includes:    Plavix 75 Mg Tabs (Clopidogrel bisulfate) .Marland Kitchen... Take 1 tablet by mouth once a day    Amlodipine Besylate 10 Mg Tabs (Amlodipine besylate) .Marland Kitchen... Take 1 tablet by mouth once a day    Bayer Aspirin 325 Mg Tabs (Aspirin) .Marland Kitchen... Take 1 tablet by mouth once a day    Isosorbide Mononitrate Cr 30 Mg Xr24h-tab (Isosorbide mononitrate) .Marland Kitchen... Take 1 tablet by mouth once a day    Nitroglycerin 0.4 Mg Subl (Nitroglycerin) .Marland Kitchen... Place 1 tablet under tongue as directed    Metoprolol Succinate 100 Mg Xr24h-tab (Metoprolol succinate) .Marland Kitchen... Take one tablet by mouth twice a day  Orders: EKG w/ Interpretation (93000) Nuclear Stress Test (Nuc Stress Test) TLB-Magnesium (Mg) (83735-MG) TLB-BMP (Basic Metabolic Panel-BMET) (80048-METABOL)  Problem # 2:  PALPITATIONS (ICD-785.1) Recommed increase Toprol to 100 mg two times a day.  Her updated medication list for this problem includes:    Plavix 75 Mg Tabs (Clopidogrel bisulfate) .Marland Kitchen... Take 1 tablet by mouth once a day    Amlodipine Besylate 10 Mg Tabs (Amlodipine besylate) .Marland Kitchen... Take 1 tablet by mouth once a day    Bayer Aspirin 325 Mg Tabs (Aspirin) .Marland Kitchen... Take 1 tablet by mouth once a day    Isosorbide Mononitrate Cr 30 Mg Xr24h-tab (Isosorbide mononitrate) .Marland Kitchen... Take 1 tablet by mouth once a day    Nitroglycerin 0.4 Mg Subl (Nitroglycerin) .Marland Kitchen... Place 1 tablet under tongue as directed    Metoprolol Succinate 100 Mg Xr24h-tab (Metoprolol succinate) .Marland Kitchen... Take one tablet by mouth twice a day  Orders: EKG w/ Interpretation (93000) Nuclear Stress Test (Nuc Stress Test) TLB-Magnesium (Mg) (83735-MG) TLB-BMP (Basic Metabolic Panel-BMET) (80048-METABOL)  Problem # 3:  HYPERLIPIDEMIA (ICD-272.4) Lipids at goal with LDL less than 70 mg/dL.  Her updated medication list for this problem includes:    Simvastatin 40  Mg Tabs (Simvastatin) .Marland Kitchen... Take one tablet by mouth daily at bedtime  Orders: EKG w/ Interpretation (93000) Nuclear Stress Test (Nuc Stress Test) TLB-Magnesium (Mg) (83735-MG) TLB-BMP (Basic Metabolic Panel-BMET) (80048-METABOL)  CHOL: 139 (01/24/2009)   LDL: 63 (01/24/2009)   HDL: 48.60 (01/24/2009)   TG: 137.0 (01/24/2009)  Patient Instructions: 1)  Your physician recommends that you have lab work today: BMP, Magnesium 2)  Your physician has recommended you make the following change in your medication: INCREASE Metoprolol Succinate to 100mg  by mouth twice a day 3)  Your physician wants you to follow-up in:  6 MONTHS.  You will receive a reminder letter in the mail two months in advance. If you don't receive a letter, please call our office to schedule the follow-up appointment. 4)  Your physician has requested that you have an adenosine myoview.  For further information please visit [email or web address]  Please follow instruction sheet, as given.  Appended Document: f76m Nuclear stress test was normal. Pt is ok to proceed with knee surgery without further testing. Her cardiac risk is  low.

## 2010-08-10 NOTE — Letter (Signed)
Summary: [facility]  [facility]   Imported By: Maryln Gottron 10/13/2009 12:28:57  _____________________________________________________________________  External Attachment:    Type:   Image     Comment:   External Document

## 2010-08-10 NOTE — Letter (Signed)
Summary: [facility]   Imported By: Maryln Gottron 07/25/2010 12:48:58  _____________________________________________________________________  External Attachment:    Type:   Image     Comment:   External Document

## 2010-08-10 NOTE — Assessment & Plan Note (Signed)
Summary: 6 month rov   Visit Type:  6 months follow up Referring Avner Stroder:  Dr Excell Seltzer Primary Mikael Debell:  Dr [name] Limbo  CC:  No complaints.  History of Present Illness: 76 year-old woman with hx of CAD who underwent PCI of the LCx and RCA with drug-eluting stents in 2006. I saw her several months ago for worsening palpitations. She underwent stress testing and an event recorder as she was increasingly symptomatic and was approaching knee surgery.  These studies were within normal limits. Her Toprol XL was increased to 100 mg two times a day.   Since her Toprol was doubled, she reports resolution of palpitations and denies CP, dyspnea, or other complaints.    Current Medications (verified): 1)  Plavix 75 Mg  Tabs (Clopidogrel Bisulfate) .... Take 1 Tablet By Mouth Once A Day 2)  Amlodipine Besylate 10 Mg Tabs (Amlodipine Besylate) .... Take 1 Tablet By Mouth Once A Day 3)  Losartan Potassium-Hctz 100-25 Mg Tabs (Losartan Potassium-Hctz) .... Take 1 Tablet By Mouth Once A Day 4)  Prilosec 40 Mg Cpdr (Omeprazole) .... Take 1 Tablet By Mouth Once A Day 5)  Bayer Aspirin 325 Mg  Tabs (Aspirin) .... Take 1 Tablet By Mouth Once A Day 6)  Magnesium Oxide 400 Mg Tabs (Magnesium Oxide) .... Take 1 Tablet By Mouth Two Times A Day 7)  Isosorbide Mononitrate Cr 30 Mg Xr24h-Tab (Isosorbide Mononitrate) .... Take 1 Tablet By Mouth Once A Day 8)  Nitroglycerin 0.4 Mg Subl (Nitroglycerin) .... Place 1 Tablet Under Tongue As Directed 9)  Potassium Chloride Crys Cr 20 Meq Cr-Tabs (Potassium Chloride Crys Cr) .... Take 1 Tablet By Mouth Once A Day 10)  Simvastatin 40 Mg Tabs (Simvastatin) .... Take One Tablet By Mouth Daily At Bedtime 11)  Metoprolol Succinate 100 Mg Xr24h-Tab (Metoprolol Succinate) .... Take One Tablet By Mouth Twice A Day 12)  Pramipexole Dihydrochloride 0.25 Mg Tabs (Pramipexole Dihydrochloride) .... Take 1 Tablet By Mouth Two Times A Day  Allergies (verified): No Known Drug  Allergies  Past History:  Past medical history reviewed for relevance to current acute and chronic problems.  Past Medical History: Reviewed history from 01/18/2009 and no changes required. Current Problems:  CAD (ICD-414.00)- post percutaneous coronary intervention in 2006. TRANSIENT ISCHEMIC ATTACK, HX OF (ICD-V12.50) HYPERTENSION (ICD-401.9) HYPERLIPIDEMIA (ICD-272.4) PERIPHERAL EDEMA (ICD-782.3) PALPITATIONS (ICD-785.1) RESTLESS LEGS SYNDROME (ICD-333.94) FATIGUE (ICD-780.79) GERD (ICD-530.81) PERSISTENT DISORDER INITIATING/MAINTAINING SLEEP (ICD-307.42) DERMATITIS (ICD-692.9)  Review of Systems       Negative except as per HPI   Vital Signs:  Patient profile:   76 year old female Height:      63 inches Weight:      176 pounds BMI:     31.29 Pulse rate:   58 / minute Pulse rhythm:   regular Resp:     18 per minute BP sitting:   130 / 80  (left arm) Cuff size:   large  Vitals Entered By: Vikki Ports (February 28, 2010 4:12 PM)  Physical Exam  General:  Pt is alert and oriented, in no acute distress. HEENT: normal Neck: normal carotid upstrokes without bruits, JVP normal Lungs: CTA CV: RRR without murmur or gallop Abd: soft, NT, positive BS, no bruit, no organomegaly Ext: no clubbing, cyanosis, or edema. peripheral pulses 2+ and equal Skin: warm and dry without rash    EKG  Procedure date:  02/28/2010  Findings:      Sinus bradycardia 58 bpm, borderline voltage for LVH, otherwise within normal  limits.  Impression & Recommendations:  Problem # 1:  CAD (ICD-414.00) Stable without angina. She is s/p DES placement 5 years ago. Continue dual antiplatelet Rx as she is tolerating it well. Myoview reviewed and showed no ischemia.  Her updated medication list for this problem includes:    Plavix 75 Mg Tabs (Clopidogrel bisulfate) .Marland Kitchen... Take 1 tablet by mouth once a day    Amlodipine Besylate 10 Mg Tabs (Amlodipine besylate) .Marland Kitchen... Take 1 tablet by mouth once  a day    Bayer Aspirin 325 Mg Tabs (Aspirin) .Marland Kitchen... Take 1 tablet by mouth once a day    Isosorbide Mononitrate Cr 30 Mg Xr24h-tab (Isosorbide mononitrate) .Marland Kitchen... Take 1 tablet by mouth once a day    Nitroglycerin 0.4 Mg Subl (Nitroglycerin) .Marland Kitchen... Place 1 tablet under tongue as directed    Metoprolol Succinate 100 Mg Xr24h-tab (Metoprolol succinate) .Marland Kitchen... Take one tablet by mouth twice a day  Orders: EKG w/ Interpretation (93000)  Problem # 2:  HYPERTENSION (ICD-401.9) at goal.  Her updated medication list for this problem includes:    Amlodipine Besylate 10 Mg Tabs (Amlodipine besylate) .Marland Kitchen... Take 1 tablet by mouth once a day    Losartan Potassium-hctz 100-25 Mg Tabs (Losartan potassium-hctz) .Marland Kitchen... Take 1 tablet by mouth once a day    Bayer Aspirin 325 Mg Tabs (Aspirin) .Marland Kitchen... Take 1 tablet by mouth once a day    Metoprolol Succinate 100 Mg Xr24h-tab (Metoprolol succinate) .Marland Kitchen... Take one tablet by mouth twice a day  BP today: 130/80 Prior BP: 122/68 (08/01/2009)  Labs Reviewed: K+: 3.9 (08/01/2009) Creat: : 1.1 (08/01/2009)   Chol: 139 (01/24/2009)   HDL: 48.60 (01/24/2009)   LDL: 63 (01/24/2009)   TG: 137.0 (01/24/2009)  Problem # 3:  HYPERLIPIDEMIA (ICD-272.4) Lipids have been at goal, need to decrease simvastatin to 20 mg in the presence of amlodipine. Due for f/u lipids and LFT's.  Her updated medication list for this problem includes:    Simvastatin 20 Mg Tabs (Simvastatin) .Marland Kitchen... Take one tablet by mouth daily at bedtime  CHOL: 139 (01/24/2009)   LDL: 63 (01/24/2009)   HDL: 48.60 (01/24/2009)   TG: 137.0 (01/24/2009)  Patient Instructions: 1)  Your physician recommends that you return for a FASTING LIPID, LIVER and BMP (414.01, 401.1, 272.0)--Nothing to eat or drink after midnight--Lab opens at 8:30  2)  Your physician has recommended you make the following change in your medication: DECREASE Simvastatin to 20mg  once a day 3)  Your physician wants you to follow-up in:  6  MONTHS.  You will receive a reminder letter in the mail two months in advance. If you don't receive a letter, please call our office to schedule the follow-up appointment. Prescriptions: SIMVASTATIN 20 MG TABS (SIMVASTATIN) Take one tablet by mouth daily at bedtime  #90 x 3   Entered by:   Julieta Gutting, RN, BSN   Authorized by:   Norva Karvonen, MD   Signed by:   Julieta Gutting, RN, BSN on 02/28/2010   Method used:   Print then Give to Patient   RxID:   [id number]

## 2010-08-10 NOTE — Letter (Signed)
Summary: [facility]   Imported By: Roderic Ovens 09/06/2009 16:38:52  _____________________________________________________________________  External Attachment:    Type:   Image     Comment:   External Document  Appended Document: [facility] review agree with treatment

## 2010-08-31 ENCOUNTER — Encounter: Payer: Self-pay | Admitting: Cardiovascular Disease

## 2010-08-31 ENCOUNTER — Ambulatory Visit (INDEPENDENT_AMBULATORY_CARE_PROVIDER_SITE_OTHER): Payer: Medicare Other | Admitting: Cardiovascular Disease

## 2010-08-31 DIAGNOSIS — E7800 Pure hypercholesterolemia, unspecified: Secondary | ICD-10-CM

## 2010-08-31 DIAGNOSIS — I2510 Atherosclerotic heart disease of native coronary artery without angina pectoris: Secondary | ICD-10-CM

## 2010-09-05 NOTE — Assessment & Plan Note (Signed)
Summary: ROV   Visit Type:  Follow-up Referring Provider:  Dr Excell Seltzer Primary Provider:  Dr [name] Limbo  CC:  Heart flutters.  History of Present Illness: 76 year-old woman with hx of CAD who underwent PCI of the LCx and RCA with drug-eluting stents in 2006. She is doing well at present without chest pain, dyspnea, or edema. She has palpitations which are longstanding. Her beta blocker was increased at the time of her last visit.     Current Medications (verified): 1)  Plavix 75 Mg  Tabs (Clopidogrel Bisulfate) .... Take 1 Tablet By Mouth Once A Day 2)  Amlodipine Besylate 10 Mg Tabs (Amlodipine Besylate) .... Take 1 Tablet By Mouth Once A Day 3)  Losartan Potassium-Hctz 100-25 Mg Tabs (Losartan Potassium-Hctz) .... Take 1 Tablet By Mouth Once A Day 4)  Prilosec 40 Mg Cpdr (Omeprazole) .... Take 1 Tablet By Mouth Two Times A Day 5)  Bayer Aspirin 325 Mg  Tabs (Aspirin) .... Take 1 Tablet By Mouth Once A Day 6)  Magnesium Oxide 400 Mg Tabs (Magnesium Oxide) .... Take 1 Tablet By Mouth Two Times A Day 7)  Isosorbide Mononitrate Cr 30 Mg Xr24h-Tab (Isosorbide Mononitrate) .... Take 1 Tablet By Mouth Once A Day 8)  Nitroglycerin 0.4 Mg Subl (Nitroglycerin) .... Place 1 Tablet Under Tongue As Directed 9)  Potassium Chloride Crys Cr 20 Meq Cr-Tabs (Potassium Chloride Crys Cr) .... Take 1 Tablet By Mouth Once A Day 10)  Simvastatin 20 Mg Tabs (Simvastatin) .... Take One Tablet By Mouth Daily At Bedtime 11)  Metoprolol Succinate 100 Mg Xr24h-Tab (Metoprolol Succinate) .... Take One Tablet By Mouth Twice A Day 12)  Pramipexole Dihydrochloride 0.25 Mg Tabs (Pramipexole Dihydrochloride) .... Take 1 Tablet By Mouth Two Times A Day  Allergies (verified): 1)  ! * Nitrofur-Macr  Past History:  Past medical history reviewed for relevance to current acute and chronic problems.  Past Medical History: Reviewed history from 01/18/2009 and no changes required. Current Problems:  CAD  (ICD-414.00)- post percutaneous coronary intervention in 2006. TRANSIENT ISCHEMIC ATTACK, HX OF (ICD-V12.50) HYPERTENSION (ICD-401.9) HYPERLIPIDEMIA (ICD-272.4) PERIPHERAL EDEMA (ICD-782.3) PALPITATIONS (ICD-785.1) RESTLESS LEGS SYNDROME (ICD-333.94) FATIGUE (ICD-780.79) GERD (ICD-530.81) PERSISTENT DISORDER INITIATING/MAINTAINING SLEEP (ICD-307.42) DERMATITIS (ICD-692.9)  Review of Systems       Negative except as per HPI   Vital Signs:  Patient profile:   76 year old female Height:      63 inches Weight:      177.25 pounds BMI:     31.51 Pulse rate:   56 / minute Pulse rhythm:   regular Resp:     18 per minute BP sitting:   134 / 74  (left arm) Cuff size:   large  Vitals Entered By: Vikki Ports (August 31, 2010 11:26 AM)  Physical Exam  General:  Pt is alert and oriented, in no acute distress. HEENT: normal Neck: normal carotid upstrokes without bruits, JVP normal Lungs: CTA CV: RRR without murmur or gallop Abd: soft, NT, positive BS, no bruit, no organomegaly Ext: no clubbing, cyanosis, or edema. peripheral pulses 2+ and equal Skin: warm and dry without rash    EKG  Procedure date:  08/31/2010  Findings:      Sinus brady 56 bpm, otherwise within normal limits.  Impression & Recommendations:  Problem # 1:  CORONARY ATHEROSCLEROSIS NATIVE CORONARY ARTERY (ICD-414.01) Pt stable without angina. She would like to reduce meds and cost - discussed that she has been on plavix for 6 years following PCI  and her risk of stent thrombosis is very low (less than 1%). We will discontinue this drug and continue daily ASA.  The following medications were removed from the medication list:    Plavix 75 Mg Tabs (Clopidogrel bisulfate) .Marland Kitchen... Take 1 tablet by mouth once a day Her updated medication list for this problem includes:    Amlodipine Besylate 10 Mg Tabs (Amlodipine besylate) .Marland Kitchen... Take 1 tablet by mouth once a day    Bayer Aspirin 325 Mg Tabs (Aspirin) .Marland Kitchen...  Take 1 tablet by mouth once a day    Isosorbide Mononitrate Cr 30 Mg Xr24h-tab (Isosorbide mononitrate) .Marland Kitchen... Take 1 tablet by mouth once a day    Nitroglycerin 0.4 Mg Subl (Nitroglycerin) .Marland Kitchen... Place 1 tablet under tongue as directed    Metoprolol Succinate 100 Mg Xr24h-tab (Metoprolol succinate) .Marland Kitchen... Take one tablet by mouth twice a day  Problem # 2:  PURE HYPERCHOLESTEROLEMIA (ICD-272.0) Lipids at goal. Followup in 6 months prior to her office visit.  Her updated medication list for this problem includes:    Simvastatin 20 Mg Tabs (Simvastatin) .Marland Kitchen... Take one tablet by mouth daily at bedtime  CHOL: 156 (03/07/2010)   LDL: 76 (03/07/2010)   HDL: 49.30 (03/07/2010)   TG: 153.0 (03/07/2010)  Problem # 3:  HYPERTENSION (ICD-401.9) Controlled.  Her updated medication list for this problem includes:    Amlodipine Besylate 10 Mg Tabs (Amlodipine besylate) .Marland Kitchen... Take 1 tablet by mouth once a day    Losartan Potassium-hctz 100-25 Mg Tabs (Losartan potassium-hctz) .Marland Kitchen... Take 1 tablet by mouth once a day    Bayer Aspirin 325 Mg Tabs (Aspirin) .Marland Kitchen... Take 1 tablet by mouth once a day    Metoprolol Succinate 100 Mg Xr24h-tab (Metoprolol succinate) .Marland Kitchen... Take one tablet by mouth twice a day  BP today: 134/74 Prior BP: 130/80 (02/28/2010)  Labs Reviewed: K+: 4.3 (03/07/2010) Creat: : 1.0 (03/07/2010)   Chol: 156 (03/07/2010)   HDL: 49.30 (03/07/2010)   LDL: 76 (03/07/2010)   TG: 153.0 (03/07/2010)  Other Orders: EKG w/ Interpretation (93000)  Patient Instructions: 1)  Your physician recommends that you return for a FASTING LIPID and LIVER Profile prior to your 6 MONTH office visit.  Nothing to eat or drink after midnight (414.01, 272.0) 2)  Your  physician has recommended you make the following change in your medication: STOP Plavix 3)  Your physician wants you to follow-up in: 6 MONTHS.   You will receive a reminder letter in the mail two months in advance. If you don't receive a letter,  please call our office to schedule the follow-up appointment.

## 2010-10-02 LAB — BASIC METABOLIC PANEL
CO2: 28 mEq/L (ref 19–32)
Chloride: 103 mEq/L (ref 96–112)
Creatinine, Ser: 0.92 mg/dL (ref 0.4–1.2)
GFR calc Af Amer: >60 mL/min (ref >60)
Glucose, Bld: 151 mg/dL — ABNORMAL HIGH (ref 70–99)

## 2010-10-26 ENCOUNTER — Other Ambulatory Visit: Payer: Self-pay | Admitting: Family Medicine

## 2010-10-26 ENCOUNTER — Telehealth: Payer: Self-pay | Admitting: Cardiovascular Disease

## 2010-10-26 MED ORDER — POTASSIUM CHLORIDE CRYS ER 20 MEQ PO TBCR: 20.0000 meq | EXTENDED_RELEASE_TABLET | Freq: Two times a day (BID) | ORAL | Status: DC

## 2010-10-26 MED ORDER — ISOSORBIDE MONONITRATE ER 30 MG PO TB24: 30.0000 mg | ORAL_TABLET | Freq: Every day | ORAL | Status: DC

## 2010-10-26 NOTE — Telephone Encounter (Signed)
Refill authorized.

## 2010-11-21 NOTE — Assessment & Plan Note (Signed)
[facility]                            CARDIOLOGY OFFICE NOTE   KASIDI, SHANKER                       MRN:          [id number]  DATE:12/12/2006                            DOB:          April 13, 1935    PRIMARY CARE PHYSICIAN:  Dr. [name] Limbo.   REASON FOR VISIT:  Routine cardiac followup.   HISTORY OF PRESENT ILLNESS:  I saw Heather [name] back in late April.  Her  history is detailed in my previous note.  From a cardiac perspective she  is not having any problems with chest pain and states that her breathing  has been overall stable.  She had had some trouble with intermittent  fatigue last time, although this has not been as much of an issue for  her.  She states that this morning she felt general malaise and had some  subjective chills.  She feels she may be coming down with some type of  viral infection.  She has had no cough, hemoptysis, rashes, or diarrhea.  She is afebrile today in the office and otherwise hemodynamically  stable.  She does mention that she has had the return of leg cramps in  the evenings.  Originally when this occurred she acquired a potassium  supplement and this improved her symptoms.   ALLERGIES:  No known drug allergies.   PRESENT MEDICATIONS:  1. Plavix 75 mg p.o. daily.  2. Mirapex 0.25 mg one to two tablets p.o. daily.  3. Hyzaar 100/25 mg p.o. daily.  4. Metoprolol 50 mg p.o. b.i.d.  5. Aspirin 325 mg p.o. daily.  6. Calcium with vitamin D.  7. Norvasc 10 mg p.o. daily.  8. Potassium 20 mEq p.o. daily.  9. Lipitor 10 mg p.o. nightly.  10.Protonix 40 mg p.o. daily.  11.Sublingual nitroglycerin p.r.n.   REVIEW OF SYSTEMS:  As described in the history of present illness.   EXAMINATION:  Blood pressure is 140/82, heart rate is 67, weight is 171  pounds which is stable, temperature is 97.2 degrees.  Patient is  comfortable and in no acute distress.  Does not appear toxic.  NECK:  Shows no elevated jugular  venous pressure or loud bruits,  thyromegaly is not noted.  LUNGS:  Generally clear, no egophony or labored breathing, no wheezing  noted.  CARDIAC:  Reveals a regular rate and rhythm, no S3 gallop or pericardial  rub.  EXTREMITIES:  Exhibit so significant pitting edema.   IMPRESSION AND RECOMMENDATIONS:  1. Coronary disease status post drug-eluting stent placement to the      circumflex and right coronary artery with medically managed mid      left anterior descending disease of 70% - 80%.  I will plan to      continue present medical regimen.  I will see her back for symptom      review in the next 6 months.  2. Return of leg cramps.  Will plan to followup with a BMET and      magnesium.  She does continue on diuretic therapy in the form of  Hyzaar and is on potassium at 20 mEq daily.  3. Possible onset of viral syndrome.  I have asked Ms. Kot to check      her temperature today, stay hydrated, and consider use of Tylenol      or Motrin.  I have asked her to contact Dr. Scotty Court if her      symptoms worsen.     Jonelle Sidle, MD  Electronically Signed    SGM/MedQ  DD: 12/12/2006  DT: 12/12/2006  Job #: [id number]   cc:   [name], MD

## 2010-11-21 NOTE — Assessment & Plan Note (Signed)
[facility]ALTHC[facility]                            CARDIOLOGY OFFICE NOTE   LARUE, DRAWDY                       MRN:          [id number]  DATE:06/11/2007                            DOB:          27-Jun-1935    PRIMARY CARE PHYSICIAN:  Dr. [name] Limbo.   REASON FOR VISIT:  Cardiac followup.   HISTORY OF PRESENT ILLNESS:  Ms. Kossman comes in for a 69-month visit.  She is not reporting any problems with angina on medical therapy.  Her  electrocardiogram today shows sinus bradycardia with small septal Q  waves.  Otherwise, nonspecific T wave changes.  She reports that she has  been having some trouble with left knee pain and was seen by an  orthopedic physician recently.  The feeling is apparently that she will  eventually need a left knee replacement, although she would like to  avoid this for the time being.  She does have some edema in her feet,  left worse than right.  Following her last visit, we checked  electrolytes and she did have a low magnesium, which is now being  supplemented, and has resulted in improvement in her leg cramps.  Her  potassium is 3.9 with normal renal function.   ALLERGIES:  No known drug allergies.   PRESENT MEDICATIONS:  1. Plavix 75 mg p.o. daily.  2. Mirapex 0.2 mg 2 tablets daily.  3. Hyzaar 100/25 mg p.o. daily.  4. Metoprolol 50 mg p.o. b.i.d.  5. Aspirin 325 mg p.o. daily.  6. Calcium with vitamin D supplements.  7. Norvasc 10 mg p.o. daily.  8. Potassium 20 mEq p.o. daily.  9. Lipitor 10 mg p.o. nightly.  10.Protonix 20 mg p.o. daily.  11.Magnesium oxide 140 mg p.o. b.i.d.   REVIEW OF SYSTEMS:  As described in the history of present illness.   EXAMINATION:  Blood pressure is 149/76, heart rate is 66, weight 175  pounds.  The patient is comfortable and in no acute distress.  NECK:  No elevated jugular venous pressure.  No loud bruits.  LUNGS:  Clear without labored breathing at rest.  CARDIAC:  Regular rate  and rhythm.  No S3 gallop.  EXTREMITIES:  Exhibit trace edema below the knees.   IMPRESSION/RECOMMENDATIONS:  1. Coronary artery disease status post drug-eluting stent placement to      the circumflex and right coronary artery back in December 2006 with      medically managed 70-80% left anterior descending stenosis and no      active angina.  I would anticipate continuing medical therapy for      now.  If she needs to proceed with a left knee replacement, I would      anticipate an adenosine Myoview for preoperative assessment, given      no ischemic testing over the last 2 years.  This is on hold at the      present time as she has no active plans for surgery.  I will see      her back in the next 6 months.  A prescription was provided for  refill sublingual nitroglycerin.  2. Further plan is to follow.     Jonelle Sidle, MD  Electronically Signed    SGM/MedQ  DD: 06/11/2007  DT: 06/11/2007  Job #: [id number]   cc:   [name], MD

## 2010-11-21 NOTE — Assessment & Plan Note (Signed)
[facility]ARE                            CARDIOLOGY OFFICE NOTE   Heather [name], Heather [name]                       MRN:          [id number]  DATE:12/09/2007                            DOB:          29-Jun-1935    PRIMARY CARE PHYSICIAN:  Tawny Asal, MD   REASON FOR VISIT:  Cardiac follow-up.   HISTORY OF PRESENT ILLNESS:  Heather [name] comes in for a 32-month visit.  She states that since I saw her last she has had a general sense of  fatigue with exertion that seems to be more intense than usual.  This  has been noted as below for several months, not recently.  She also has  a general sense of brief fluttering in her chest that she has had  before.  Her history includes previous drug-eluting stents to the  circumflex and right coronary artery in December 2006 with a medically-  managed 70-80% left anterior descending stenosis that has actually been  fairly stable from the perspective of angina over the last few years.  Her last Myoview was in 2006.  Today's electrocardiogram shows sinus  rhythm with voltage criteria for left ventricular hypertrophy and  otherwise no acute changes.  She has been compliant with her  medications.  She is not a long-acting nitrate.  She has also been  battling with progressive knee discomfort due to arthritis and may  ultimately come to a knee replacement, although this is not actively  scheduled.   ALLERGIES:  No known drug allergies.   MEDICATIONS:  1. Plavix 75 mg p.o. daily.  2. Mirapex 0.25 mg as directed.  3. Hyzaar 100/25 mg p.o. daily.  4. Metoprolol 50 mg p.o. b.i.d.  5. Aspirin 325 mg p.o. daily.  6. Calcium with vitamin D.  7. Norvasc 10 mg p.o. daily.  8. Potassium 20 mEq p.o. daily.  9. Magnesium 400 mg p.o. daily.  10.Omeprazole 20 mg p.o. daily.  11.Lipitor 20 mg p.o. daily.  12.Sublingual nitroglycerin p.r.n.   REVIEW OF SYSTEMS:  As described in the history of present illness.  Otherwise  negative.   PHYSICAL EXAMINATION:  Blood pressure 156/80, heart rate is 66.  Weight  is 175 pounds.  The patient is comfortable, in no acute distress.  HEENT:  Conjunctivae and lids normal.  Pharynx clear.  NECK:  Supple.  No elevated jugular venous pressure.  No loud bruits.  No thyromegaly is noted.  LUNGS:  Clear without labored breathing at rest.  CARDIAC:  A regular rate and rhythm.  No S3 gallop.  No loud murmur.  ABDOMEN:  Soft, nontender.  Normoactive bowel sounds.  EXTREMITIES:  No significant pitting edema.  Distal pulses are 2+.  SKIN:  Warm and dry.  MUSCULOSKELETAL:  No kyphosis noted.  NEUROPSYCHIATRIC:  The patient is alert and oriented x3.  Affect is  appropriate.   IMPRESSION/RECOMMENDATIONS:  Coronary disease, status post drug-eluting  stent placed into the circumflex and right coronary artery in December  2006 with otherwise medically-managed 70-80% left anterior descending  stenosis.  The patient's left anterior descending anatomy was described  as being fairly small although approachable per Dr. Rosalyn Charters  intervention note from September 2006.  She has, fortunately, done quite  well on medical therapy alone although was describing some symptoms  which may be anginal.  We talked about adding Imdur 30 mg daily to her  present regimen and arranging a follow-up adenosine Myoview on medical  therapy.  If she has no large degree of ischemia, we might consider  continuing with medical therapy.  Otherwise, consideration for  percutaneous intervention can be weighed.  In the event that she is  stable on medical therapy I will have her follow up over the next 6  months, at which time she will see Dr. Excell Seltzer.     Jonelle Sidle, MD  Electronically Signed    SGM/MedQ  DD: 12/09/2007  DT: 12/09/2007  Job #: [id number]

## 2010-11-21 NOTE — Assessment & Plan Note (Signed)
[facility]CARE                            CARDIOLOGY OFFICE NOTE   Heather, [name]                       MRN:          [id number]  DATE:08/02/2008                            DOB:          1935-07-06    REASON FOR VISIT:  Followup coronary artery disease.   HISTORY OF PRESENT ILLNESS:  Heather [name] is a 76 year old woman with  history of coronary artery disease.  She has undergone stenting of the  left circumflex and right coronary artery and has been treated medically  for moderate LAD stenosis.  She underwent a nuclear stress test in June  2009 that showed no ischemia.  LV function was normal with an LVEF of  70%.  She denies chest pain or dyspnea.  Her main complaint is that of  generalized fatigue.  She describes profound fatigue that seems to be  progressive.  She also has had episodes of lightheadedness and near-  syncope.  She reports 3 days recently where she felt weak and close to  passing out.  She was able to sit down or lie down and the episodes were  aborted.  She has not had any palpitations or other associated symptoms.  She denies frank syncope.   MEDICATIONS:  1. Imdur 30 mg daily.  2. Plavix 75 mg daily.  3. Mirapex 0.25 mg 2 daily.  4. Hyzaar 100/25 mg daily.  5. Metoprolol 50 mg b.i.d.  6. Aspirin 325 mg daily.  7. Calcium Plus D daily.  8. Norvasc 10 mg daily.  9. Potassium chloride 20 mEq daily.  10.Magnesium 400 mg daily.  11.Omeprazole 20 mg daily.  12.Lipitor 20 mg daily.   ALLERGIES:  NKDA.   PHYSICAL EXAMINATION:  GENERAL:  The patient is alert and oriented.  She  is in no acute distress.  VITAL SIGNS:  Weight 177 pounds, blood pressure 150/70, heart rate 61,  respiratory rate 12.  HEENT:  Normal.  NECK:  Normal carotid upstrokes.  No bruits.  JVP normal.  LUNGS:  Clear bilaterally.  HEART:  Regular rate and rhythm.  No murmurs, gallops.  ABDOMEN:  Soft, nontender.  No organomegaly.  EXTREMITIES:  No clubbing,  cyanosis, or edema.  Peripheral pulses are  intact and equal.  SKIN:  Warm and dry without rash.   EKG shows normal sinus rhythm and is within normal limits with the  exception of borderline criteria for LVH, suspect normal variant.   ASSESSMENT:  1. Coronary artery disease, status post percutaneous coronary      intervention in 2006.  Normal nuclear study last year.  The patient      has no angina.  Continue medical therapy.  2. Dyslipidemia.  I am unable to find recent lipids.  Lipids in      December 2007 showed cholesterol of 134, triglycerides 94, LDL 66,      HDL 49.  LFTs were normal.  We will repeat lipids and LFTs at this      time.  3. Hypertension.  Blood pressure control is suboptimal.  I am going to  check a few diagnostic studies before making any changes in her      antihypertensive regimen.  4. Near-syncope.  We will check an event recorder to make sure that      she is not having bradyarrhythmias or other arrhythmic cause of      lightheadedness.  5. Fatigue.  We will check overnight sleep study to rule out      obstructive sleep apnea.  This could potentially be causing her      profound fatigue and also exacerbating her hypertension.  Followup      after results of tests are available.   For clinical followup, I would like to see her back in 6 months.     Heather [name]. Excell Seltzer, MD  Electronically Signed    MDC/MedQ  DD: 08/02/2008  DT: 08/03/2008  Job #: [id number]   cc:   [name], MD

## 2010-11-24 NOTE — H&P (Signed)
Heather [name], Heather [name]                ACCOUNT NO.:  [id number]   MEDICAL RECORD NO.:  [id number]          PATIENT TYPE:  INP   LOCATION:  1845                         FACILITY:  [facility]   PHYSICIAN:  Bruce H. Swords, M.D. [facility] OF BIRTH:  [birth year]   DATE OF ADMISSION:  03/16/2005  DATE OF DISCHARGE:                                HISTORY & PHYSICAL   CHIEF COMPLAINT:  Neck and chest pain.   HISTORY OF PRESENT ILLNESS:  Ms. Lortz is a 76 year old female who  developed bilateral neck discomfort associated with a tingling sensation of  her tongue the evening of March 15, 2005.  When she tried to go to sleep  that night, she developed substernal chest discomfort described as a  pressure sensation, intensity 5/10.  No associated or modifying factors  known.  She did take an aspirin at that time and called EMS.  EMS treated  her with nitroglycerin with complete relief of pain, both chest and neck  pain.  She had no associated shortness of breath, nausea, diaphoresis.  All  of her discomfort was at rest.  When questioned, the patient denies any  exertional chest discomfort.  She did note that for the past several months  when she would lie down, she would occasionally have some chest fluttering.   PAST MEDICAL HISTORY:  1.  Significant for a history of TIA in 2000.  2.  She has a history of hypertension.  3.  Status post hysterectomy.  4.  Hiatal hernia.  5.  Arthroscopic knee surgery.  6.  Restless leg syndrome.   MEDICATIONS:  (Doses unknown of all medications.)  Norvasc, Hyzaar, Inderal,  Nexium, Mirapex, aspirin, K-Dur, Plavix.   SOCIAL HISTORY:  She is married.  She is a nonsmoker, does not drink  alcohol.   FAMILY HISTORY:  Mother alive at 63, status post bypass surgery.  Father  deceased with a stroke at age 82.  Has 3 brothers, 1 deceased with cancer.   REVIEW OF SYSTEMS:  She denies any complaints in a complete review of  systems other than those listed above.   PHYSICAL EXAMINATION:  VITAL SIGNS:  Blood pressure 136/75, pulse 87,  respirations 18.  GENERAL:  She appears as a well-developed, well-nourished female lying  comfortably on a hospital examining table.  HEENT:  Atraumatic, normocephalic.  Extraocular muscles are intact.  NECK:  Supple without lymphadenopathy, thyromegaly, jugular venous  distention or carotid bruits.  CHEST:  Clear to auscultation bilaterally without any increased work of  breathing.  There is no dullness to percussion.  CARDIAC:  S1 and S2 are normal without murmurs or gallops.  ABDOMEN:  Active bowel sounds, soft, nontender.  There is no  hepatosplenomegaly.  No masses are palpated.  EXTREMITIES:  There is no cyanosis, clubbing or edema.  NEUROLOGIC:  She is alert and oriented without any motor or sensory  deficits.  RECTAL:  Exam not done.   LABORATORY DATA:  Troponin I 0.16.  The rest of her laboratories are  unremarkable.  She does have a potassium of 3.4.   EKGs were reviewed.  EKGs in the hospital are essentially normal.  On  complete review of the EKGs on EMS records, there is 1 EKG that shows marked  anterior ST depression.   ASSESSMENT AND PLAN:  1.  Chest and neck discomfort, abnormal EKG, elevated troponin I.  The      patient has had a non-Q-wave myocardial infarction.  She needs further      evaluation and aggressive treatment.  The patient will be started on      intravenous fluids, intravenous heparin, beta blocker.  We will continue      her aspirin and Plavix.  I have consulted Cardiology.  She needs a      coronary angiogram; this was discussed with the patient and she      understands that.  2.  Hypertension, adequately controlled at this time.  If she has recurrent      chest discomfort, we will start nitroglycerin to maintain a blood      pressure of 100-120 systolically.  3.  We will check a lipid panel.  4.  Gastroesophageal reflux disease.  We will continue Nexium.  5.  The patient  desires full code; I agree.      Bruce Rexene Edison Swords, M.D. [facility]  Electronically Signed     BHS/MEDQ  D:  03/16/2005  T:  03/16/2005  Job:  [id number]   cc:   [name], M.D.  [address] [place]  [place] [address]

## 2010-11-24 NOTE — H&P (Signed)
Heather [name], Heather [name]                ACCOUNT NO.:  [id number]   MEDICAL RECORD NO.:  [id number]          PATIENT TYPE:  INP   LOCATION:  4712                         FACILITY:  [facility]   PHYSICIAN:  Rollene Rotunda, M.D.   DATE OF BIRTH:  [birth year]   DATE OF ADMISSION:  06/26/2005  DATE OF DISCHARGE:                                HISTORY & PHYSICAL   CARDIOLOGIST:  Dr. Simona Huh   PRIMARY CARE:  Dr. Scotty Court   Heather [name] is a 76 year old Caucasian female with known history of coronary  artery disease recently diagnosed in September 2006 with non-ST-elevated MI.  Cardiac catheterization with a Taxus stent to the circumflex. Residual  disease in the right coronary and LAD which is being treated medically. Heather [name] presents to [facility] emergency room today with complaints of chest  heaviness with fluttering, onset around 8 p.m. yesterday evening while doing  laundry. She began having a constant fluttering in her chest around her  heart. She associated it with some heaviness. She rated the heaviness around  a 5 on a scale of 1-10. It lasted around 4-and-a-half hours. Around 9 p.m.  she took one nitroglycerin with some decrease in heaviness, laid down and  took a nap for about an hour-and-a-half, woke up with fluttering sensation  and heaviness still there, took another nitroglycerin with four baby aspirin  around midnight. She stated this discomfort started to ease up then. She  went to bed, very restless, poor sleep. Woke up this morning around 5 a.m.  The fluttering in her chest started again along with the heaviness around 10  a.m. The patient presented to [facility] for further evaluation. She also  complains of increased weakness and nausea x1 week. She denies any  diaphoresis, lightheadedness, dizziness, shortness of breath. She cares for  a 31 year old mother which she states causes a lot of stress. Heather [name]  states this discomfort was not similar to presentation before  her MI in  September. At that time she had associated jaw pain; however, did have some  chest fluttering approximately a week before her MI.   ALLERGIES:  No known drug allergies.   MEDICATIONS:  1.  Lipitor 80 mg.  2.  Mirapex 25 one to two tablets p.o. q.h.s.  3.  Metoprolol 50 mg b.i.d.  4.  KCl 20 daily.  5.  Norvasc 5 mg.  6.  Hyzaar 100/25.  7.  Plavix 75.  8.  Nexium 40.   PAST MEDICAL HISTORY:  Includes cardiac catheterization September 2006; for  further details see catheterization report. LV EF of 70%. Post cardiac  catheterization the patient had adenosine Myoview in October 2006 negative  for ischemia with an EF of 70%. Other medical history includes hypertension,  TIA in 2000, GERD, hiatal hernia, restless legs syndrome.   SOCIAL HISTORY:  She lives in [place] with her husband. She has three  children. Has never smoked, used alcohol, drugs, or herbal medication. She  tries to follow a heart healthy diet. No routine exercise.   FAMILY HISTORY:  Mother alive at age 39 status  post coronary artery bypass  graft surgery at 88 years of age. Father deceased at age 2 secondary to  CVA. The father did have a pacemaker, unknown reason. Three siblings, no  coronary artery disease, positive for TIA in her brother.   REVIEW OF SYSTEMS:  Positive for chest pain, palpitations, occasional  wheezing, weakness, questionable depression, poor sleep. GI positive for  nausea and GERD symptoms. All other systems negative per patient.   PHYSICAL EXAMINATION:  VITAL SIGNS:  Temperature 98.2, pulse is 60,  respirations 18, blood pressure 177/81, saturating 97% on room air.  GENERAL:  She is in no acute distress, slightly anxious. Exam unremarkable.  HEENT:  Sclerae clear. Pupils equal, round, and reactive to light.  NECK:  Supple without lymphadenopathy. No bruit or JVD.  CARDIOVASCULAR:  Heart rate regular rate and rhythm, S1, S2. Pulses are 2+  and equal without bruits.  LUNGS:   Clear to auscultation bilaterally.  ABDOMEN:  Soft, nontender, positive bowel sounds.  EXTREMITIES:  Without clubbing, cyanosis, or edema. She has 2+ DP  bilaterally.  NEUROLOGIC:  She is alert and oriented x3. Cranial nerves II-XII grossly  intact.   Chest x-ray showing no acute disease. EKG at a rate of 57, sinus rhythm,  sinus bradycardia.   LABORATORY WORK:  White blood cell count 6.1; hemoglobin 13.6; hematocrit  39.8; platelet count 189,000. Sodium 143, potassium 3.6, BUN 15, creatinine  0.9, with a glucose of 100. AST elevated at 70, ALT of 111. Point-of-care  markers:  Troponin 0.07 with a CK-MB of 2.8.   Dr. Angelina Sheriff in to examine and assess the patient with problem list:  1.  Palpitations associated with heaviness in the chest in the setting of      history of coronary artery disease status post cardiac catheterization      with a Taxus stent to the circumflex.  2.  Hypertension.  3.  Hyperlipidemia.   The patient will be admitted, rule out MI. If negative enzymes, consider  outpatient stress test, questionable dobutamine or exercise Cardiolite as  the patient does not feel well after adenosine. The patient with elevated  liver function. Will hold Lipitor, check an amylase and lipase. Initial  point-of-care markers with an elevated troponin. Also will check a TSH and  magnesium level, supplement potassium x1, recheck chemistry in a.m.      Dorian Pod, NP    ______________________________  Rollene Rotunda, M.D.    MB/MEDQ  D:  06/26/2005  T:  06/27/2005  Job:  [id number]

## 2010-11-24 NOTE — Discharge Summary (Signed)
NAMESIDONIA, NUTTER                ACCOUNT NO.:  [id number]   MEDICAL RECORD NO.:  [id number]           PATIENT TYPE:   LOCATION:  6522                         FACILITY:  [facility]   PHYSICIAN:  Olga Millers, M.D. LHCDATE OF BIRTH:  [birth year]   DATE OF ADMISSION:  06/26/2005  DATE OF DISCHARGE:  06/28/2005                                 DISCHARGE SUMMARY   CARDIOLOGIST:  Jonelle Sidle, MD, [facility].   PRIMARY CARE PHYSICIAN:  Tawny Asal, MD.   DISCHARGE DIAGNOSES:  1.  Coronary artery disease, status percutaneous coronary intervention.  2.  Hypertension.  3.  Increased liver function tests.  4.  Cholelithiasis.  5.  Right groin bruit, status post cardiac catheterization, status post      ultrasound negative for pseudoaneurysm or arteriovenous fistula.   PAST MEDICAL HISTORY:  1.  Coronary artery disease.  2.  Non-ST-elevated MI in September of 2006, status post Taxus stent to the      circumflex.  3.  Hypertension.  4.  TIA.  5.  GERD.  6.  Hiatal hernia.  7.  Restless leg syndrome.   HOSPITAL COURSE:  Ms. Pellecchia is a very pleasant, 76 year old, Caucasian  female admitted with chest discomfort and fluttering in chest.  Ms. Goughnour  has a known history of coronary artery disease and had a cardiac  catheterization in September of 2006 by Dr. Riley Kill.  Ms. Halfhill was also  found to have elevated liver functions this admission of questionable  etiology, possibly secondary to Lipitor.  Lipitor was discontinued.  The  patient continued to complain of fluttering episodes in chest, telemetry  monitor showing normal sinus rhythm with occasional PVCs.  It was decided  that patient needed further evaluation of cardiac status and taken to the  cath lab on June 27, 2005, by Dr. Maurine Cane, resulting in a cardiac  intervention by Dr. Riley Kill with a Taxus stent of the right coronary artery.  The patient tolerated the procedures without complication, however, was  noted to  have a bruit in the right groin, ultrasound of groin done on  June 28, 2005.  The patient also had ultrasound of abdomen secondary to  increased LFTs.  The patient was found to have gallstones, but no sign of  cholecystitis.  Lipitor was discontinued.  The patient will need LFTs  rechecked in four weeks.  If LFTs remained elevated, patient will need a GI  consult.  If develops symptoms of cholecystitis, a possible cholecystectomy  in the near future if needed.  Results of cardiac  catheterization/intervention discussed with Dr. Riley Kill.  He feels we should  continue medical therapy for LAD lesion, if patient has symptoms to proceed  with PCI of LAD if needed, continue her Plavix, and resume aspirin.   DISCHARGE MEDICATIONS:  1.  Nitroglycerin p.r.n. for chest discomfort.  2.  Aspirin 325 mg daily.  3.  Nexium 40 mg daily.  4.  Norvasc has been increased to 10 mg daily.  5.  Metoprolol 50 mg b.i.d.  6.  Plavix 75 mg daily.  7.  Hyzaar 100/25 mg  daily.  8.  Mirapex as previously used.  9.  No Lipitor.   FOLLOWUP:  Follow up with Dr. Diona Browner, January 4th, at 12:30.  Patient will  call our office for any problems from cath site.  She has also been given  [facility] discharge instructions.  She will need LFTs  checked in four weeks.  This can be done with her primary care physician,  Dr. Scotty Court, with [facility].   DURATION OF DISCHARGE:  Greater than 30 minutes.      Dorian Pod, NP    ______________________________  Olga Millers, M.D. [facility]    MB/MEDQ  D:  06/28/2005  T:  06/30/2005  Job:  [id number]   cc:   Jonelle Sidle, M.D. [facility]  [address]  [place]  [place] [address]   [name], M.D.  [address] [place]  [place] [address]   Arturo Morton. Riley Kill, M.D. [facility] [place]  [address]  [place]  [place] [address]   Charlton Haws, M.D.  [address]  [address]  [place]  [place] [address]

## 2010-11-24 NOTE — Discharge Summary (Signed)
Heather [name], Heather [name]                ACCOUNT NO.:  [id number]   MEDICAL RECORD NO.:  [id number]          PATIENT TYPE:  INP   LOCATION:  6522                         FACILITY:  [facility]   PHYSICIAN:  Olga Millers, M.D. LHCDATE OF BIRTH:  May 14, 1935   DATE OF ADMISSION:  06/26/2005  DATE OF DISCHARGE:  06/28/2005                                 DISCHARGE SUMMARY   CARDIOLOGIST:  Jonelle Sidle, M.D. [facility]   PRIMARY CARE PHYSICIAN:  Tawny Asal, M.D.   DISCHARGE DIAGNOSES:  1.  Coronary artery disease, status post percutaneous coronary intervention.  2.  Hypertension.  3.  Elevated liver function tests.  4.  Cholelithiasis.  5.  Right groin bruit, status post cardiac catheterization. Ultrasound of      right groin bruit with no evidence of pseudoaneurysm or AV fistula.   PAST MEDICAL HISTORY:  1.  Coronary artery disease, non-ST elevated MI in September 2006; cardiac      catheterization with a Taxus stent to the circumflex at that time; right      coronary artery and LAD medically treated.  2.  Hypertension.  3.  TIA in 2000.  4.  GERD.  5.  Hiatal hernia.  6.  Restless leg syndrome.   SOCIAL HISTORY:  The patient lives in [place] with her husband. She is  not employed. She is married with three children. She denies ETOH, tobacco,  drug, or herbal medication use. No routine medication exercise. She tries to  follow a heart healthy diet.   FAMILY HISTORY:  Mother alive at age 49, had bypass at 23 of years. Father  deceased, age 34, secondary to CVA. He did have a permanent pacemaker. She  has there siblings. No known coronary artery disease. Positive for TIAs.   REVIEW OF SYSTEMS:  Positive for chest pain, palpitations, occasional  wheezing. The patient relates wheezing  to her allergies. NEUROPSYCHIATRIC:  Positive for generalized weakness, poor sleep, questionable depression,  increased stress. GI: Positive for nausea, GERD symptoms. All other symptoms  negative  per patient.   PHYSICAL EXAMINATION:  Dictation ended at this point.      Dorian Pod, NP    ______________________________  Olga Millers, M.D. [facility]    MB/MEDQ  D:  06/28/2005  T:  06/30/2005  Job:  [id number]

## 2010-11-24 NOTE — Assessment & Plan Note (Signed)
[facility]                            CARDIOLOGY OFFICE NOTE   Heather [name], Heather [name]                       MRN:          [id number]  DATE:10/28/2006                            DOB:          08-28-1934    PRIMARY CARE PHYSICIAN:  Dr. [name] [name].   REASON FOR VISIT:  Followup coronary artery disease.   Heather [name] returns for a routine visit.  Overall she has done relatively  well although does state that intermittently she has had a sudden  feeling of tiredness, sometimes with exertion and sometimes at rest.  She has had no frank chest pain or breathlessness with this however.  These spells cause her to have to stop what she is doing if she is  exerting herself and rest, sometimes as much as an hour.  She has not  had to use any nitroglycerin with this.  Her electrocardiogram today  shows sinus bradycardia with no significant changes compared to her  previous tracing.  She has been compliant with her medications otherwise  and her followup lipids from December showed an LDL of 66 with HDL 48  and normal liver function tests on Lipitor.   ALLERGIES:  NO KNOWN DRUG ALLERGIES.   PRESENT MEDICATIONS:  1. Plavix 75 mg p.o. daily.  2. Mirapex 0.25 one to two tablets p.o. daily.  3. Hyzaar 100/25 mg p.o. daily.  4. Metoprolol 50 mg p.o. b.i.d.  5. Aspirin 325 mg p.o. daily.  6. Calcium with vitamin D.  7. Norvasc 10 mg p.o. daily.  8. Potassium supplements 20 mEq p.o. daily.  9. Lipitor 10 mg p.o. nightly.  10.Protonix 40 mg p daily.  11.She also has sublingual nitroglycerin p.r.n.   REVIEW OF SYSTEMS:  As described in the history of present illness, all  others negative.   EXAMINATION:  Blood pressure is 177/83, heart rate of 64, patient was  not weighed.  GENERAL:  Comfortable and in no acute distress without active symptoms.  HEENT:  Conjunctivae, eyelids normal; oropharynx clear.  NECK:  Supple, no elevated jugular venous pressure, loud  bruits, no  thyromegalies noted.  LUNGS:  Clear without labored breathing at rest.  CARDIAC:  Reveals a regular rate and rhythm, no S3 gallop or pericardial  rub.  ABDOMEN:  Soft, nontender.  EXTREMITIES:  Show no significant pitting edema.  SKIN:  Warm and dry, distal pulses are 2+.  MUSCULOSKELETAL:  No kyphosis is noted.  NEUROPSYCHIATRIC:  The patient is alert and oriented x3.   IMPRESSION AND RECOMMENDATION:  1. Coronary artery disease, status post previous drug-eluting stent      placement to the circumflex and right coronary artery with      medically managed mid left anterior descending disease of 70% -      80%.  She underwent intervention in December of 2006 and we have      followed her medically since that time with good symptom control.      It is not clear that the intermittent spells of fatigue are      ischemia related, and she is not  manifesting any active angina.  I      plan to add Imdur 30 mg p.o. daily to her regimen and have her come      back to see me over the next 6 weeks.  Ultimately a followup      Myoview will be considered as well, although at this point she      prefers to hold off on this.  I have asked her to let me know if      she has progressive symptoms in the interim.  Otherwise, will      continue present medicines.  2. Further plans to follow.     Heather Sidle, MD  Electronically Signed    SGM/MedQ  DD: 10/28/2006  DT: 10/28/2006  Job #: [id number]   cc:   Heather [name]., MD

## 2010-11-24 NOTE — Cardiovascular Report (Signed)
NAMEABBEGALE, STEHLE                ACCOUNT NO.:  [id number]   MEDICAL RECORD NO.:  [id number]          PATIENT TYPE:  INP   LOCATION:  2903                         FACILITY:  [facility]   PHYSICIAN:  Arturo Morton. Riley Kill, M.D. [facility] OF BIRTH:  January 09, 1935   DATE OF PROCEDURE:  03/16/2005  DATE OF DISCHARGE:                              CARDIAC CATHETERIZATION   INDICATIONS:  Ms. Mchatton has had a previous TIA treated in 2000.  She  recently has had about a 18-month history of some exertional chest tightness.  She was brought in today with significant ST segment changes in the anterior  precordial leads which subsequently resolved.  She is now pain-free and in  the catheterization laboratory.   PROCEDURES:  1.  Left heart catheterization.  2.  Selective coronary arteriography.  3.  Selective left ventriculography.  4.  Percutaneous coronary intervention and stenting of the circumflex artery      using a 2.75 x 24 TAXUS drug-eluting stent with 3.25 post-dilatation.   DESCRIPTION OF PROCEDURE:  The patient was brought to the catheterization  laboratory and prepped and draped in the usual fashion.  She had been  consented for the [facility].  Through an anterior puncture, the right  femoral artery was easily entered without difficulty.  A 6-French sheath was  placed.  Views of the left and right coronary arteries were then obtained in  multiple angiographic projections.  Central aortic and left ventricular  pressures were measured with a pigtail.  Because of the question of  revascularizations surgery, subclavian angiography was performed.  Following  this, and I called Dr. Diona Browner to the laboratory and he and I discussed  with the patient the various options which include revascularization surgery  versus culprit percutaneous intervention with possible later intervention of  both the LAD and right coronary.  We discussed the various options with her  in detail.  After a considerable  discussion, it was decided to proceed with  percutaneous intervention of the circumflex coronary artery.  I did discuss  this with the family in detail as well.  Following this, the patient was  loaded with both bivalirudin as well as randomized placebo with either The  Champion protocol with Cangrelor or placebo.  She was given the oral placebo  as well.  She is on chronic Plavix therapy.  Following this, ACTs were  checked subsequently and we checked the ACT in another room and it was found  to be 375 seconds.  A JL-3.5 guiding catheter was utilized with a BMW wire.  A 2.25-mm balloon was used for predilatation and then the vessel was stented  using a 24 x 2.75 TAXUS drug-eluting stent; this was taken up to 13  atmospheres.  Post-dilatation was then done with a 3.25 Quantum Maverick  balloon throughout the course of the stent.  There was dramatic improvement  in the appearance of the artery without evidence of edge dissection.  She  tolerated the procedure well.  All subsequent catheters were removed.  The  bivalirudin was discontinued.  She was taken to the holding area in  satisfactory clinical condition.   HEMODYNAMIC DATA:  1.  Central aortic pressure 145/68, mean 100.  2.  Left atrial pressure 147/19.  3.  No gradient on pullback across aortic valve.   ANGIOGRAPHIC DATA.:  1.  Ventriculography was done the RAO projection.  Overall systolic function      was preserved.  No segmental abnormalities or contraction were      identified.  2.  The left main is free of critical disease.  3.  The LAD has some calcification.  There is about 70% to 75% stenosis      which was brought out by intracoronary nitroglycerin at or involving the      takeoff of the diagonal.  The diagonal itself has a segmental area of      60% narrowing throughout the proximal portion of the vessel leading up      to the ostium.  4.  The circumflex has a small proximal marginal branch and then a second       marginal branch and then is subtotally occluded.  This is a 95% stenosis      followed by an area of some segmental disease.  Following stenting with      the TAXUS 24 x 2.75 drug-eluting stent and post-dilatation with a 3.25      Quantum Maverick balloon, this was reduced to 0%.  The distal vessel is      a large-caliber vessel that is free of critical disease.  5.  The right coronary artery is a fairly large-caliber vessel with a      segmental area of 70% to 75% narrowing in the midvessel.  This      terminates as the PDA and posterolateral branch which are free of      critical disease.   CONCLUSIONS:  Successful percutaneous stenting of the circumflex coronary  artery with residual disease involving both the right coronary artery and  left anterior descending.   DISPOSITION:  I have reviewed the case in depth with Dr. Diona Browner.  She has  been successfully stented and is on chronic Plavix therapy.  Hopefully, she  will continue to improve.  Functional testing will be undertaken to assess  the right coronary artery and LAD.  The right is fairly favorable for  percutaneous intervention and will require a drug-eluting stent.  The LAD is  slightly more problematic, but the LAD should be able to be approached  percutaneously as well, if this is the ultimate plan.      Arturo Morton. Riley Kill, M.D. [facility]  Electronically Signed     TDS/MEDQ  D:  03/16/2005  T:  03/17/2005  Job:  [id number]   cc:   [facility]   Valetta Mole. Swords, M.D. [facility]  [address] [place]  [place] [address]   Jonelle Sidle, M.D. [facility]  [address]  [place]  [place] [address]

## 2010-11-24 NOTE — Cardiovascular Report (Signed)
Heather [name], Heather [name]                ACCOUNT NO.:  [id number]   MEDICAL RECORD NO.:  [id number]          PATIENT TYPE:  INP   LOCATION:  6522                         FACILITY:  [facility]   PHYSICIAN:  Arturo Morton. Riley Kill, M.D. Heather [name] OF BIRTH:  Jan 04, 1935   DATE OF PROCEDURE:  06/27/2005  DATE OF DISCHARGE:  06/28/2005                              CARDIAC CATHETERIZATION   INDICATIONS:  [name] is a 76 year old who has previously presented with  an acute coronary syndrome and underwent stenting of the circumflex. She had  LAD and right disease at that time. Follow-up perfusion imaging did not  demonstrate significant defect and therefore she was treated medically. She  represents with palpitations and chest tightness. Diagnostic study done by  Dr. Eden Emms demonstrated continued patency of the circumflex. Because of the  high-grade right disease in the LAD, various options were discussed. At the  present time, we elected to open the right with potential for deciding on  the LAD either as a continued medical approach, or in fact as a staged  procedure.   PROCEDURE:  Percutaneous stenting of the right coronary artery.   DESCRIPTION OF PROCEDURE:  The patient had a diagnostic procedure done by  Dr. Eden Emms. She was on the table. Intravenous unfractionated heparin had  been administered. The patient was on chronic Plavix. She was given a slight  increase in dose of Plavix for the procedure. A JR-4 guiding catheter with  side holes was utilized. A Prowater wire was then used to pass across the  lesion. Overall, this created no problem. Predilatation was done with a 15 x  2.25 balloon. The vessel was then stented with a 2.75 x 28 Taxus drug-  eluting stent. This was taken up to 14 atmospheres a 3.25. PowerSail was  then taken to the distal aspect of the stent and post dilatation done at low  to intermediate pressures. In the middle of the stent, the post dilatation  balloon was taken up to 10  atmospheres and proximally up to 12 atmospheres.  There was marked improvement in the appearance of the artery. There was  slight indentation just above the stent, but this disappeared after  intracoronary nitroglycerin and removal of the wire. The final angiographic  result was excellent. The stent resulted in a reduction from 80% to 0%, and  also of note was some mild disease distal to the stent site which was not  covered due to the length of stent that would be required.   All catheters were subsequently removed and the femoral sheath sewn into  place. She was taken to the holding area in satisfactory clinical condition.   ANGIOGRAPHIC DATA:  The right coronary artery has minimal luminal  irregularity in the proximal vessel. In the midvessel, there is a segmental  stenosis of about 80% that has almost a corkscrew-type appearance. The  vessel then becomes about 70% more distal to the corkscrew lesion. The  vessel then opens up and has about 30% and then widely patent more distally.  The stent was taken from the area just across the area of critical  disease.  There is a long segment mild irregularity at the distal vessel which was not  stented due to the length of stent that will be required. There was a final  excellent angiographic appearance without evidence of edge tear or  dissection. There is mild luminal irregularity in both proximal PDA and in  the posterolateral system. There were no complications.   CONCLUSION:  1.  Successful percutaneous intervention of the right coronary artery.  2.  Previous successful percutaneous intervention of the circumflex coronary      artery with continued patency.  3.  Continued moderately high-grade left anterior descending artery disease      in the midvessel, but with a small-caliber vessel and overlapping side      branch at the location.   DISPOSITION:  We will review the final films with Dr. Diona Browner and make a  final decision.       Arturo Morton. Riley Kill, M.D. Heather [name]  Electronically Signed     TDS/MEDQ  D:  06/27/2005  T:  06/28/2005  Job:  [id number]   cc:   Jonelle Sidle, M.D. [facility]  [address]  [place]  [place] [address]   Charlton Haws, M.D.  [address]  [place]  [place] [address]   CV Laboratory   Patient's medical record

## 2010-11-24 NOTE — Cardiovascular Report (Signed)
NAMEDANELLA, Heather [name]                ACCOUNT NO.:  [id number]   MEDICAL RECORD NO.:  [id number]          PATIENT TYPE:  INP   LOCATION:  6522                         FACILITY:  [facility]   PHYSICIAN:  Charlton Haws, M.D.     DATE OF BIRTH:  17-Feb-1935   DATE OF PROCEDURE:  06/27/2005  DATE OF DISCHARGE:  06/28/2005                              CARDIAC CATHETERIZATION   INDICATIONS:  Known three-vessel disease. Stent to the mid-circumflex in  September of this year. Admitted with chest pain and palpitations.   Cine catheterization was done from the right femoral artery.   Left main coronary artery had a 20% discrete stenosis.   Left anterior descending artery had a 70-80% stenosis in the midvessel. This  included the takeoff of the diagonal branch which had a 70% lesion. From  previous viewing of her films in September, I started with a JL-3.5 catheter  and injected in 200 mcg of IC nitroglycerin. The LAD was still a small-  caliber vessel.   The circumflex coronary artery was nondominant. The stent to the OM was  widely patent. There was no other residual disease.   The right coronary artery was dominant. There was a long tubular area of 70-  80% stenosis in the midvessel.   RAO VENTRICULOGRAPHY:  RAO ventriculography showed minimal anterior apical  wall hypokinesis. EF was 55%. There was no gradient across the aortic valve  and no MR. Aortic pressure 160/75, LV pressure is 160/17.   IMPRESSION:  The films were reviewed with Dr. Riley Kill. The patient had known  three-vessel disease. Apparently she had a Myoview in October which was  nonischemic; however, both her left anterior descending artery and right  coronary artery are quite tight. I think the plan will be to dilate the  right coronary artery today since it is an easier lesion and then she will  come back for staged left anterior descending artery angioplasty. The  patient will then have drug-eluting stenting in all three of her  arteries.  The stent to the circumflex seems to have held up well over the last three  months.   She will need to be on lifelong Plavix.   Again, given the patient's age of 67 years, she may very well end up needing  open-heart surgery if her stents do not hold up.           ______________________________  Charlton Haws, M.D.     PN/MEDQ  D:  06/27/2005  T:  06/28/2005  Job:  [id number]

## 2010-11-24 NOTE — Discharge Summary (Signed)
Heather [name], Heather [name]                ACCOUNT NO.:  [id number]   MEDICAL RECORD NO.:  [id number]          PATIENT TYPE:  INP   LOCATION:  4708                         FACILITY:  [facility]   PHYSICIAN:  Rene Paci, M.D. LHCDATE OF BIRTH:  03/31/1935   DATE OF ADMISSION:  03/16/2005  DATE OF DISCHARGE:  03/19/2005                                 DISCHARGE SUMMARY   DISCHARGE DIAGNOSES:  1.  Status post non-ST segment elevation myocardial infarction with      circumflex stent.  2.  Hypertension.   HISTORY OF PRESENT ILLNESS:  Patient is a 76 year old female who presented  with complaints of bilateral neck discomfort associated with tingling  sensation of her tongue on the evening of March 15, 2005.  When patient  tried to go to sleep that night, she developed substernal chest discomfort  which she described as a pressure sensation with an intensity of 5/10.  Patient took an aspirin and called EMS and was treated with nitroglycerin  with complete relief of pain.  Patient was admitted for further evaluation.   PAST MEDICAL HISTORY:  1.  History of TIA in 2000.  2.  History of hypertension.  3.  History of hiatal hernia.  4.  Status post hysterectomy.  5.  History of arthroscopic knee surgery.  6.  Restless leg syndrome.   HOSPITAL COURSE:  PROBLEM #1 -  Non-ST segment elevation myocardial  infarction:  The patient was admitted and underwent serial cardiac enzymes  which were positive.  A cardiology consult was obtained.  Patient was seen  by Dr. Diona Browner and underwent cardiac catheterization with stenting of the  circumflex artery on March 16, 2005.  Patient was maintained on beta-  blockers as well as Plavix.  Inderal was changed to Lopressor 50 mg p.o.  b.i.d. and Norvasc was decreased to 5 mg daily from 10 mg daily.  In  addition, patient was maintained on daily aspirin.  At this time, the  cardiology team has cleared the patient for discharge with outpatient follow-  up  with Dr. Diona Browner which has been scheduled for April 06, 2005, at  9:30 a.m.   PROBLEM #2 -  HYPERTENSION:  The patient's Norvasc was decreased to 5 mg  p.o. daily and has remained stable.  Blood pressure at the time of discharge  is 137/66.   DISCHARGE LABORATORY DATA:  Hemoglobin 12.1, hematocrit 35.2, white blood  cell count 8.0, platelets 162, BUN 10, creatinine 0.9.   DISCHARGE MEDICATIONS:  1.  Plavix 75 mg p.o. daily.  2.  Aspirin 325 mg p.o. daily.  3.  Hyzaar 100/25 mg daily.  4.  Norvasc 5 mg p.o. daily decreased from 10 mg p.o. daily.  5.  Nexium 40 mg p.o. daily.  6.  Lipitor 80 mg at bedtime.  7.  Nitroglycerin 0.4 mg as needed.  8.  Lopressor 50 mg p.o. b.i.d.  9.  K-Dur.  Patient is to resume preadmission dosing.  10. Calcium.  Patient is to resume preadmission dosing.   Prescriptions were provided for the patient by cardiology for above dosing  of  Lopressor, Lipitor and nitroglycerin.  In addition, I provided the  patient with a 30-day supply of Norvasc as well as an additional  prescription for 90 days as she receives her medications by mail order in  three-month supplies.  I have also provided prescription for three-month  supply of above dosing of Lopressor and above dosing of Lipitor.   FOLLOW UP:  The patient is to follow up with Dr. Diona Browner of cardiology on  April 06, 2005, at 9:30 a.m.  In addition, patient is to follow up with  Dr. Scotty Court, her primary care, on March 28, 2005, at 10:30 a.m.      Melissa S. Peggyann Juba, NP      Rene Paci, M.D. [facility]  Electronically Signed    MSO/MEDQ  D:  03/19/2005  T:  03/19/2005  Job:  [id number]   cc:   Jonelle Sidle, M.D. [facility]  [address]  [place]  [place] [address]   [name], M.D.  [address] [place]  [place] [address]

## 2010-11-24 NOTE — Op Note (Signed)
[facility]  Patient:    Heather [name], Heather [name]                         MRN: [id number] Proc. Date: 04/01/00 Attending:  Griffith Citron, M.D. CC:         Milus Mallick, M.D.  Feliciana Rossetti, M.D.   Operative Report  PROCEDURE:   Panendoscopy, Savary dilation.  ENDOSCOPIST:  Griffith Citron, M.D.  INDICATIONS:  A 76 year old white female with symptoms of dysphagia. Significant prior history of paraesophageal hernia repair performed by Dr. Mosetta Anis in 1990.  Over the past year, solid food dysphagia.  Barium swallow confirms distal esophageal stricture, benign appearing; hiatal hernia with delay in passage of a 13 mm diameter barium table, undergoing endoscopy to further evaluate and/or possible therapeutic esophageal dilation.  DESCRIPTION OF PROCEDURE:  After reviewing the nature of the procedure with the patient including the increased risk of hemorrhage and perforation, informed consent was signed.  The patient was premedicated with topical anesthetic followed by IV sedation totalling Versed 9 mg, fentanyl 75 mcg administered IV in divided doses prior to and during the course of the procedure.  Using an Olympus video endoscope, the proximal esophagus was intubated under direct vision.  Oropharynx was normal, no lesion of the epiglottis, vocal cords, or pyriform sinus.  The proximal mid and distal segments of the esophagus normal to the level of the mucosal Z line.  A benign stricture was apparent at 37 cm.  This did not obstruct passage of the scope.  A small hiatal hernia extending to 40 cm was identified, noninflamed.  Gastric fundus and antrum normal.  Pylorus symmetric.  Duodenal bulb and second portion normal.  Retroflexed view of the angularis, lesser curvature, and gastric cardium fundus confirmed the hiatal hernia defect, but no additional finding was identified.  Along the greater curvature, a Savary guidewire was laid and  position maintained under fluoroscopic control while the scope was withdrawn.  Savary dilators 15, 16, and 17 mm diameter were sequentially passed, each monitored with fluoroscopy.  With passage of the final dilator, both the dilator and the guidewire were withdrawn.  There was no heme staining with any of the dilators, no chest pain, no cough.  The patient tolerated the procedure without immediate complications and returned to the recovery room in stable condition.  ASSESSMENT: 1. Distal esophageal stricture, benign.  No evidence for reflux esophagitis,    no evidence of neoplasia.  The lesion is noninflamed. 2. Hiatal hernia, small. 3. Savary esophageal dilation, 15, 16, and 17 mm diameter passage.  RECOMMENDATIONS: 1. Continue PPI for four to eight weeks.  Probably intolerant to Nexium with    resulting diarrhea.  The patient will go by the office to pick up samples    of Aciphex, Protonix, and Prevacid to see if any of these offer better    tolerance. 2. Return office visit in one month to reassess symptoms of dysphagia.    Consider further Savary dilation if symptoms persist. DD:  04/01/00 TD:  04/02/00 Job: [id number]

## 2010-11-24 NOTE — Consult Note (Signed)
[name], [name]                ACCOUNT NO.:  [id number]   MEDICAL RECORD NO.:  [id number]          PATIENT TYPE:  INP   LOCATION:  1845                         FACILITY:  [facility]   PHYSICIAN:  Jonelle Sidle, M.D. LHCDATE OF BIRTH:  1935/05/07   DATE OF CONSULTATION:  03/16/2005  DATE OF DISCHARGE:                                   CONSULTATION   REQUESTING PHYSICIAN:  Dr. Birdie Sons   PRIMARY CARE PHYSICIAN:  Dr. Eleonore Chiquito   REASON FOR CONSULTATION:  Chest pain associated with electrocardiographic  changes and abnormal cardiac markers concerning for non ST elevation  myocardial infarction.   HISTORY OF PRESENT ILLNESS:  Heather [name] is a pleasant 76 year old woman with  a history of hypertension, hiatal hernia with gastroesophageal reflux  disease, and previous transient ischemic attack in 2000 on Plavix since that  time and followed by Dr. Sandria Manly as an outpatient.  She presented to the  emergency department yesterday having developed sudden onset intense  bilateral neck discomfort at rest.  She states that she went to bed thinking  that her symptoms would resolve and subsequently developed chest discomfort  as well.  Symptoms persisted for several minutes until the arrival of EMS  and administration of nitroglycerin.  Electrocardiographic strips obtained  per EMS show fairly significant anterolateral ST segment depression up to 2-  1/2 mm.  These changes have subsequently improved on serial tracings.  The  patient did have some recurrent chest pain this morning.  This also resolved  with nitroglycerin and she is now on both nitroglycerin and heparin drips.   Heather [name] denies having any typical exertional chest pain or shortness of  breath, although in retrospect she does describe intermittent episodes of  chest tightness that have occurred over the last two or three months.  She  has also experienced intermittent palpitations describing both sensations of  rapid  heart rate and skipped heartbeats.  She has no clear history of  dysrhythmia.  She is now admitted to the primary care service and we have  been asked to assist in her management.   ALLERGIES:  No known drug allergies.   HOME MEDICATIONS:  1.  Plavix 75 mg p.o. daily.  2.  Norvasc 10 mg p.o. daily.  3.  Hyzaar 100/25 mg p.o. daily.  4.  Propranolol 60 mg p.o. daily.  5.  Mirapex 0.25 mg one to two tablets p.o. q.h.s.  6.  Nexium 40 mg p.o. q.h.s.  7.  Vitamin E 1000 international units p.o. daily.  8.  Os-Cal with vitamin D 500 mg two tablets p.o. q.h.s.  9.  Aspirin 81 mg p.o. q.h.s.  10. Vivelle patch 0.1 mg twice a week.  11. Zinc 50 mg p.o. daily.   PAST MEDICAL HISTORY:  1.  Hypertension.  2.  Hiatal hernia with history of gastroesophageal reflux disease.  3.  History of transient ischemic attack in 2000 with arteriogram evaluation      at that time.  She has been managed with Plavix since then and sees Dr.      Sandria Manly as  an outpatient.  4.  Restless leg syndrome.  5.  Status post hysterectomy.  6.  Status post arthroscopic knee surgery.   SOCIAL HISTORY:  Patient is married and lives in [place].  She works part-  time at the [employer].  She has three children.  She denies any  history of tobacco use and does not drink alcohol.  She uses both an  outdoors and stationary bicycle at times for exercise.   FAMILY HISTORY:  Significant for stroke in the patient's mother who died at  age 14.  There is no obvious premature cerebrovascular disease history.   REVIEW OF SYSTEMS:  As per history of present illness.  Occasionally she has  some chills at night.  She also experiences intermittent vertigo.  She  describes some leg discomfort with ambulation suggesting possible  claudication symptoms.  At times she has problems with numbness in her right  arm apparently due to carpal tunnel syndrome.  She has knee and neck  arthralgias.  She also has reflux symptoms.   Systems are otherwise negative.   PHYSICAL EXAMINATION:  VITAL SIGNS:  Temperature 97.1 degrees, heart rate  62, respirations 16, blood pressure 119/68, oxygen saturation 97% on 2 L  nasal cannula.  GENERAL:  This is a well-nourished woman, comfortable, denying active chest  pain.  HEENT:  Conjunctivae and lids are normal.  Oropharynx is clear.  NECK:  Supple without elevated jugular venous pressure or loud carotid  bruits.  No thyromegaly is noted.  LUNGS:  Clear to auscultation except for some rhonchorous changes at the  bases that clear with deep breath.  There is no wheezing or labored  breathing at rest.  CARDIAC:  Regular rate and rhythm without S3 gallop, loud murmur, or  pericardial rub.  ABDOMEN:  Soft and nontender with no hepatomegaly.  Normal bowel sounds and  no tenderness to palpation.  No bruits noted.  EXTREMITIES:  No clubbing, cyanosis, edema with intact peripheral pulses at  2+.  SKIN:  No ulcerative changes are noted.  MUSCULOSKELETAL:  No kyphosis is noted.  NEUROPSYCHIATRIC:  The patient is alert and oriented x3.  Affect is normal.   Chest x-ray is pending at this time.   Most recent electrocardiogram from 6:00 this morning shows a sinus rhythm  with nonspecific ST changes.   LABORATORY DATA:  WBC 7.4, hemoglobin 12.6, platelets 209.  Troponin I 0.16.  INR 0.9.  Sodium 138, potassium 3.4, chloride 105, bicarbonate 24,  creatinine 1.1, glucose 142.   IMPRESSION:  1.  Symptoms, electrocardiographic changes, and initial cardiac markers      consistent with non ST elevation myocardial infarction.  At present      patient is symptom-free on intravenous nitroglycerin and heparin.  She      has no antecedent history of coronary artery disease or myocardial      infarction.  2.  Hypertension.  3.  History of transient ischemic attack in 2000.  4.  Hiatal hernia with gastroesophageal reflux disease. 5.  Unknown lipid status.   RECOMMENDATIONS:  1.  I reviewed  the situation with the patient and discussed the risks and      benefits of diagnostic coronary angiography in order to define the      coronary anatomy and assess for revascularization strategies.  She is in      agreement to proceed and we have placed her on the add-on board for      procedure today.  2.  Continue aspirin, Plavix, heparin, nitroglycerin, and metoprolol at this      point in addition to home medications with the exception of propranolol.      Will also need to discuss the patient's hormone replacement therapy as      it is likely this will need to be discontinued as well.  3.  Follow up fasting lipid profile.  4.  Replace potassium.  5.  Further recommendations to follow pending additional information.           ______________________________  Jonelle Sidle, M.D. [facility]     SGM/MEDQ  D:  03/16/2005  T:  03/16/2005  Job:  [id number]

## 2010-11-24 NOTE — Assessment & Plan Note (Signed)
[facility]                              CARDIOLOGY OFFICE NOTE   CHYANE, GREER                       MRN:          [id number]  DATE:04/08/2006                            DOB:          18-Dec-1934    PRIMARY CARE PHYSICIAN:  Dr. Tawny Asal.   REASON FOR VISIT:  Followup coronary artery disease.   HISTORY OF PRESENT ILLNESS:  I saw Ms. Rimmer back in April. She continues  to do quite well and without any exertional angina or shortness of breath.  Electrocardiogram today showed sinus bradycardia at 58 beats per minute with  no other acute ST changes. Her medications are outlined below. Lipid profile  following her last visit showed LDL cholesterol of 74 with an increase in  HDL to 47 and normal liver function tests representing a significant  improvement from December. She had liver function tests abnormalities on  higher dose Lipitor previously.   ALLERGIES:  No known drug allergies.   CURRENT MEDICATIONS:  1. Plavix 75 mg p.o. daily.  2. Mirapex 0.25 mg 1-2 tablets p.o. daily.  3. Hyzaar 100/25 mg p.o. daily.  4. Metoprolol 50 mg p.o. b.i.d.  5. Aspirin 325 mg p.o. daily.  6. Calcium with vitamin D 2 tablets p.o. daily.  7. Norvasc 10 mg p.o. daily.  8. Potassium 20 mEq p.o. daily.  9. Lipitor 10 mg p.o. q.h.s.  10.Protonix 40 mg p.o. daily.   REVIEW OF SYSTEMS:  As described in the history of present illness.   PHYSICAL EXAMINATION:  VITAL SIGNS:  Blood pressure is 158/77, heart rate is  58, weight is 168 pounds.  GENERAL:  The patient is comfortable in no acute distress.  NECK:  Examination of the neck reveals no elevated jugular venous  distention. No bruits.  LUNGS:  Clear without labored breathing.  CARDIAC:  Regular rate and rhythm without loud murmur or gallop.  EXTREMITIES:  Exhibit no significant pitting edema.   IMPRESSION/RECOMMENDATIONS:  Coronary artery disease status post drug-  eluting stent placement to  the circumflex and right coronary artery with  medically managed mid left anterior descending disease of 70-80%, presently  asymptomatic with medical therapy. I have asked her to continue with regular  exercise which she has gotten away from to some degree since her last visit.  Will also plan to followup on lipid profile to ensure she remains at goal  and that there is no significant re-elevation of liver function tests. I  will otherwise plan to see her back over the next 6 months for symptom  review.   Further plans to follow.       Jonelle Sidle, MD     SGM/MedQ  DD:  04/08/2006  DT:  04/09/2006  Job #:  [id number]

## 2011-03-19 ENCOUNTER — Other Ambulatory Visit: Payer: Self-pay | Admitting: Cardiovascular Disease

## 2011-03-30 ENCOUNTER — Other Ambulatory Visit: Payer: Medicare Other | Admitting: *Deleted

## 2011-03-30 ENCOUNTER — Ambulatory Visit: Payer: Medicare Other | Admitting: Cardiovascular Disease

## 2011-04-20 ENCOUNTER — Encounter: Payer: Self-pay | Admitting: Cardiovascular Disease

## 2011-05-03 ENCOUNTER — Ambulatory Visit (INDEPENDENT_AMBULATORY_CARE_PROVIDER_SITE_OTHER): Payer: Medicare Other | Admitting: *Deleted

## 2011-05-03 ENCOUNTER — Ambulatory Visit (INDEPENDENT_AMBULATORY_CARE_PROVIDER_SITE_OTHER): Payer: Medicare Other | Admitting: Cardiovascular Disease

## 2011-05-03 ENCOUNTER — Encounter: Payer: Self-pay | Admitting: Cardiovascular Disease

## 2011-05-03 VITALS — BP 124/68 | HR 56 | Resp 18 | Ht 63.0 in | Wt 176.8 lb

## 2011-05-03 DIAGNOSIS — I2510 Atherosclerotic heart disease of native coronary artery without angina pectoris: Secondary | ICD-10-CM

## 2011-05-03 DIAGNOSIS — E7800 Pure hypercholesterolemia, unspecified: Secondary | ICD-10-CM

## 2011-05-03 DIAGNOSIS — E785 Hyperlipidemia, unspecified: Secondary | ICD-10-CM

## 2011-05-03 DIAGNOSIS — I10 Essential (primary) hypertension: Secondary | ICD-10-CM

## 2011-05-03 LAB — LIPID PANEL
Cholesterol: 155 mg/dL (ref 0–200)
LDL Cholesterol: 73 mg/dL (ref 0–99)
Triglycerides: 137 mg/dL (ref 0.0–149.0)
VLDL: 27.4 mg/dL (ref 0.0–40.0)

## 2011-05-03 LAB — HEPATIC FUNCTION PANEL
AST: 23 U/L (ref 0–37)
Albumin: 4.2 g/dL (ref 3.5–5.2)
Alkaline Phosphatase: 77 U/L (ref 39–117)
Total Protein: 6.8 g/dL (ref 6.0–8.3)

## 2011-05-03 NOTE — Assessment & Plan Note (Signed)
The patient is stable without angina. Last year his Myoview stress test was reviewed. This is dated August 04, 1999. It showed no sign of scar or ischemia. Her quantitative left ventricular ejection fraction was 70%. She remains on aspirin for antiplatelet therapy. She is on a good medical regimen that includes simvastatin, Toprol-XL, and losartan/HCTZ. She also takes isosorbide.

## 2011-05-03 NOTE — Assessment & Plan Note (Signed)
Lipids have been at goal with last LDL 76. Repeat lab work was drawn today

## 2011-05-03 NOTE — Assessment & Plan Note (Signed)
Blood pressure is well controlled on current medical program. 

## 2011-05-03 NOTE — Progress Notes (Signed)
HPI:  This is a 76 year old woman presented for followup evaluation. She had multivessel stenting in 2006 in a staged manner with treatment of both the left circumflex and right coronary arteries. Drug-eluting stent platforms were utilized. She had moderately severe stenosis in the LAD that has been managed medically over time without problems. She had a stress Myoview scan last year demonstrating no significant ischemia.  The patient is limited by left knee pain. 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 She has no chest pain or dyspnea. She denies edema, orthopnea, or PND. The patient has chronic palpitations that have been greatly improved with doubling of her long-acting metoprolol.  Outpatient Encounter Prescriptions as of 05/03/2011  Medication Sig Dispense Refill  . amLODipine (NORVASC) 10 MG tablet Take 10 mg by mouth daily.        Marland Kitchen aspirin 325 MG tablet Take 325 mg by mouth daily.        . isosorbide mononitrate (IMDUR) 30 MG 24 hr tablet Take 1 tablet (30 mg total) by mouth daily.  30 tablet  11  . losartan-hydrochlorothiazide (HYZAAR) 100-25 MG per tablet TAKE 1 TABLET EVERY DAY  90 tablet  2  . magnesium oxide (MAG-OX) 400 MG tablet Take 400 mg by mouth daily.        . metoprolol (TOPROL-XL) 100 MG 24 hr tablet TAKE 1 TABLET BY MOUTH TWICE A DAY  180 tablet  2  . nitroGLYCERIN (NITROSTAT) 0.4 MG SL tablet Place 0.4 mg under the tongue every 5 (five) minutes as needed.        Marland Kitchen omeprazole (PRILOSEC) 40 MG capsule Take 40 mg by mouth daily.       . potassium chloride SA (K-DUR,KLOR-CON) 20 MEQ tablet Take 20 mEq by mouth daily.        . pramipexole (MIRAPEX) 0.25 MG tablet Take 0.25 mg by mouth 2 (two) times daily.        . simvastatin (ZOCOR) 20 MG tablet Take 20 mg by mouth at bedtime.        Marland Kitchen DISCONTD: potassium chloride SA (KLOR-CON M20) 20 MEQ tablet Take 1 tablet (20 mEq total) by mouth 2 (two)  times daily.  60 tablet  11    No Known Allergies  Past Medical History  Diagnosis Date  . Coronary artery disease     post percutaneous coronary intervention in 2006  . Transient ischemic attack (TIA)   . Hypertension   . Hyperlipidemia   . Peripheral edema   . Palpitations   . Restless legs   . Fatigue   . GERD (gastroesophageal reflux disease)   . Persistent disorder of initiating or maintaining sleep   . Dermatitis   . H/O: hysterectomy   . S/P inguinal herniorrhaphy   . H/O: knee surgery     ROS: Negative except as per HPI  BP 124/68  Pulse 56  Resp 18  Ht 5\' 3"  (1.6 m)  Wt 176 lb 12.8 oz (80.196 kg)  BMI 31.32 kg/m2  PHYSICAL EXAM: Pt is alert and oriented, very pleasant overweight woman in NAD HEENT: normal Neck: JVP - normal, carotids 2+= without bruits Lungs: CTA bilaterally CV: bradycardic and regular without murmur or gallop Abd: soft, NT, Positive BS, no hepatomegaly Ext: no C/C/E, distal pulses intact and equal Skin: warm/dry no rash  EKG:  Sinus bradycardia 56 beats per minute, minimal voltage criteria for  LVH maybe normal variant, within normal limits.  ASSESSMENT AND PLAN:

## 2011-05-03 NOTE — Patient Instructions (Signed)
Your physician wants you to follow-up in: 1 YEAR.  You will receive a reminder letter in the mail two months in advance. If you don't receive a letter, please call our office to schedule the follow-up appointment.  Your physician recommends that you continue on your current medications as directed. Please refer to the Current Medication list given to you today.  

## 2011-05-18 ENCOUNTER — Other Ambulatory Visit: Payer: Self-pay | Admitting: Gynecology

## 2011-05-18 ENCOUNTER — Telehealth: Payer: Self-pay | Admitting: Cardiovascular Disease

## 2011-05-18 DIAGNOSIS — Z1231 Encounter for screening mammogram for malignant neoplasm of breast: Secondary | ICD-10-CM

## 2011-05-18 NOTE — Telephone Encounter (Signed)
Pt returning call from Lauren. Please call back.

## 2011-05-18 NOTE — Telephone Encounter (Signed)
I spoke with the pt and made her aware of cholesterol results.

## 2011-05-26 ENCOUNTER — Other Ambulatory Visit: Payer: Self-pay

## 2011-05-26 ENCOUNTER — Encounter (HOSPITAL_COMMUNITY): Payer: Self-pay | Admitting: *Deleted

## 2011-05-26 ENCOUNTER — Emergency Department (HOSPITAL_COMMUNITY)
Admission: EM | Admit: 2011-05-26 | Discharge: 2011-05-26 | Disposition: A | Discharge status: Home / Self Care | Payer: Medicare Other | Attending: Emergency Medicine | Admitting: Emergency Medicine

## 2011-05-26 ENCOUNTER — Emergency Department (HOSPITAL_COMMUNITY): Payer: Medicare Other

## 2011-05-26 DIAGNOSIS — R5381 Other malaise: Secondary | ICD-10-CM | POA: Insufficient documentation

## 2011-05-26 DIAGNOSIS — R42 Dizziness and giddiness: Secondary | ICD-10-CM | POA: Insufficient documentation

## 2011-05-26 DIAGNOSIS — R5383 Other fatigue: Secondary | ICD-10-CM | POA: Insufficient documentation

## 2011-05-26 DIAGNOSIS — R110 Nausea: Secondary | ICD-10-CM | POA: Insufficient documentation

## 2011-05-26 DIAGNOSIS — E860 Dehydration: Secondary | ICD-10-CM | POA: Insufficient documentation

## 2011-05-26 HISTORY — DX: Unspecified atrial fibrillation: I48.91

## 2011-05-26 LAB — CBC
HCT: 38 % (ref 36.0–46.0)
Hemoglobin: 13.1 g/dL (ref 12.0–15.0)
MCH: 32 pg (ref 26.0–34.0)
MCHC: 34.5 g/dL (ref 30.0–36.0)
MCV: 92.7 fL (ref 78.0–100.0)
Platelets: 131 K/uL — ABNORMAL LOW (ref 150–400)
RBC: 4.1 MIL/uL (ref 3.87–5.11)
RDW: 12.7 % (ref 11.5–15.5)
WBC: 5.4 K/uL (ref 4.0–10.5)

## 2011-05-26 LAB — URINALYSIS, ROUTINE W REFLEX MICROSCOPIC
Bilirubin Urine: NEGATIVE
Glucose, UA: NEGATIVE mg/dL
Hgb urine dipstick: NEGATIVE
Specific Gravity, Urine: 1.011 (ref 1.005–1.030)
Urobilinogen, UA: 0.2 mg/dL (ref 0.0–1.0)
pH: 5.5 (ref 5.0–8.0)

## 2011-05-26 LAB — POCT I-STAT, CHEM 8
BUN: 24 mg/dL — ABNORMAL HIGH (ref 6–23)
Chloride: 102 mEq/L (ref 96–112)
Creatinine, Ser: 1.6 mg/dL — ABNORMAL HIGH (ref 0.50–1.10)
Glucose, Bld: 137 mg/dL — ABNORMAL HIGH (ref 70–99)
Hemoglobin: 13.3 g/dL (ref 12.0–15.0)
Potassium: 3.9 mEq/L (ref 3.5–5.1)
Sodium: 138 mEq/L (ref 135–145)

## 2011-05-26 LAB — DIFFERENTIAL
Eosinophils Absolute: 0.1 10*3/uL (ref 0.0–0.7)
Lymphocytes Relative: 27 % (ref 12–46)
Lymphs Abs: 1.5 10*3/uL (ref 0.7–4.0)
Monocytes Relative: 14 % — ABNORMAL HIGH (ref 3–12)
Neutrophils Relative %: 56 % (ref 43–77)

## 2011-05-26 LAB — BASIC METABOLIC PANEL WITH GFR
BUN: 22 mg/dL (ref 6–23)
CO2: 26 meq/L (ref 19–32)
Calcium: 8.8 mg/dL (ref 8.4–10.5)
Chloride: 100 meq/L (ref 96–112)
Creatinine, Ser: 1.48 mg/dL — ABNORMAL HIGH (ref 0.50–1.10)
GFR calc Af Amer: 39 mL/min — ABNORMAL LOW
GFR calc non Af Amer: 34 mL/min — ABNORMAL LOW
Glucose, Bld: 137 mg/dL — ABNORMAL HIGH (ref 70–99)
Potassium: 3.9 meq/L (ref 3.5–5.1)
Sodium: 136 meq/L (ref 135–145)

## 2011-05-26 MED ORDER — SODIUM CHLORIDE 0.9 % IV BOLUS (SEPSIS)
500.0000 mL | Freq: Once | INTRAVENOUS | Status: AC
  Administered 2011-05-26: 500 mL via INTRAVENOUS

## 2011-05-26 MED ORDER — ONDANSETRON HCL 4 MG/2ML IJ SOLN
INTRAMUSCULAR | Status: AC
  Administered 2011-05-26: 15:00:00 via INTRAVENOUS
  Filled 2011-05-26: qty 2

## 2011-05-26 MED ORDER — SODIUM CHLORIDE 0.9 % IV BOLUS (SEPSIS)
1000.0000 mL | Freq: Once | INTRAVENOUS | Status: AC
  Administered 2011-05-26: 1000 mL via INTRAVENOUS

## 2011-05-26 NOTE — ED Notes (Signed)
C/o dizziness, weakness, nausea since noon.

## 2011-05-26 NOTE — ED Notes (Signed)
Family at bedside. 

## 2011-05-26 NOTE — ED Provider Notes (Signed)
History    patient with a history of atrial fibrillation is presenting today with an acute onset of  lightheadedness fatigue, nausea, and dizziness which lasted for several hours.  Patient states she was cooking when she noticed a sensation of hot flashes throughout body, followed by feeling lightheadedness. She then proceeds today down but symptoms did not seem to resolve. She checks her blood pressure and noticed that her systolic blood pressure was in the 90s. She felt extremely tired afterward. She denies fever, headache, hearing changes, ringing ear, chest pain, shortness of breath, nausea, vomiting, diarrhea, dysuria, or numbness.  She mentioned that she has diagnosis of TIA in early 2000 but have not had any similar symptoms. Patient states she has been very functional, not experiencing similar symptoms.  She denies any medication changes.  Patient does not take any blood thinner medication.  CSN: [id number] Arrival date & time: 05/26/2011  3:36 PM   First MD Initiated Contact with Patient 05/26/11 1613      Chief Complaint  Patient presents with  . Weakness    (Consider location/radiation/quality/duration/timing/severity/associated sxs/prior treatment) Patient is a 76 y.o. female presenting with weakness. The history is provided by the patient. No language interpreter was used.  Weakness The primary symptoms include dizziness. Primary symptoms do not include headaches, syncope, loss of consciousness, altered mental status, visual change, paresthesias, focal weakness, speech change, memory loss, fever, nausea or vomiting. The symptoms began 2 to 6 hours ago. The symptoms are improving. The neurological symptoms are diffuse. The symptoms occurred after standing up.  Dizziness also occurs with weakness. Dizziness does not occur with tinnitus, nausea or vomiting.  Additional symptoms include weakness. Additional symptoms do not include loss of balance or tinnitus. Medical issues do not include  cerebral vascular accident, alcohol use or drug use.    Past Medical History  Diagnosis Date  . Coronary artery disease     post percutaneous coronary intervention in 2006  . Transient ischemic attack (TIA)   . Hypertension   . Hyperlipidemia   . Peripheral edema   . Palpitations   . Restless legs   . Fatigue   . GERD (gastroesophageal reflux disease)   . Persistent disorder of initiating or maintaining sleep   . Dermatitis   . H/O: hysterectomy   . S/P inguinal herniorrhaphy   . H/O: knee surgery   . Atrial fibrillation     Past Surgical History  Procedure Date  . Abdominal hysterectomy   . Hernia repair   . Knee arthroscopy     Family History  Problem Relation Age of Onset  . Arthritis      family hx of  . Hypertension      family hx of  . Stroke Father     family hx of M 1st degree relative <50  . Other      family hx of cardiovascular disorder  . Coronary artery disease Mother   . Cancer Brother     bladder    History  Substance Use Topics  . Smoking status: Never Smoker   . Smokeless tobacco: Not on file  . Alcohol Use: No    OB History    Grav Para Term Preterm Abortions TAB SAB Ect Mult Living                  Review of Systems  Constitutional: Negative for fever.  HENT: Negative for tinnitus.   Cardiovascular: Negative for syncope.  Gastrointestinal: Negative for nausea and vomiting.  Neurological: Positive for dizziness and weakness. Negative for speech change, focal weakness, loss of consciousness, headaches, paresthesias and loss of balance.  Psychiatric/Behavioral: Negative for memory loss and altered mental status.  All other systems reviewed and are negative.    Allergies  Review of patient's allergies indicates no known allergies.  Home Medications   Current Outpatient Rx  Name Route Sig Dispense Refill  . AMLODIPINE BESYLATE 10 MG PO TABS Oral Take 10 mg by mouth daily.      . ASPIRIN 325 MG PO TABS Oral Take 325 mg by mouth  daily.      . ISOSORBIDE MONONITRATE ER 60 MG PO TB24 Oral Take 30 mg by mouth daily.      Marland Kitchen LOSARTAN POTASSIUM-HCTZ 100-25 MG PO TABS  TAKE 1 TABLET EVERY DAY 90 tablet 2  . MAGNESIUM OXIDE 400 MG PO TABS Oral Take 400 mg by mouth 2 (two) times daily.     Marland Kitchen METOPROLOL SUCCINATE 100 MG PO TB24  TAKE 1 TABLET BY MOUTH TWICE A DAY 180 tablet 2  . OMEPRAZOLE 40 MG PO CPDR Oral Take 40 mg by mouth daily.     Marland Kitchen ONDANSETRON HCL 4 MG/5ML PO SOLN Intravenous Inject 4 mg into the vein once.      Marland Kitchen POTASSIUM CHLORIDE CRYS CR 20 MEQ PO TBCR Oral Take 20 mEq by mouth daily.      Marland Kitchen PRAMIPEXOLE DIHYDROCHLORIDE 0.25 MG PO TABS Oral Take 0.25 mg by mouth 2 (two) times daily.      Marland Kitchen SIMVASTATIN 20 MG PO TABS Oral Take 20 mg by mouth at bedtime.      Marland Kitchen NITROGLYCERIN 0.4 MG SL SUBL Sublingual Place 0.4 mg under the tongue every 5 (five) minutes as needed.        BP 105/56  Pulse 59  Temp(Src) 97.8 F (36.6 C) (Oral)  SpO2 97%  Physical Exam  Nursing note and vitals reviewed. Constitutional: She is oriented to person, place, and time.       Awake, alert, nontoxic appearance  HENT:  Head: Atraumatic.  Right Ear: External ear normal.  Left Ear: External ear normal.  Eyes: Conjunctivae and EOM are normal. Pupils are equal, round, and reactive to light. Right eye exhibits no discharge. Left eye exhibits no discharge.  Neck: Normal range of motion. Neck supple.  Cardiovascular: Normal rate and regular rhythm.  Exam reveals no gallop and no friction rub.   No murmur heard. Pulmonary/Chest: Effort normal. No respiratory distress. She exhibits no tenderness.  Abdominal: Soft. Bowel sounds are normal. There is no tenderness. There is no rebound.  Musculoskeletal: Normal range of motion. She exhibits no tenderness.       Baseline ROM, no obvious new focal weakness  Neurological: She is alert and oriented to person, place, and time. She has normal reflexes. She displays normal reflexes. No cranial nerve deficit.  She exhibits normal muscle tone. Coordination normal.       Mental status and motor strength appears baseline for patient and situation  Skin: Skin is warm and dry. No rash noted.  Psychiatric: She has a normal mood and affect.    ED Course  Procedures (including critical care time)  Labs Reviewed - No data to display No results found.   No diagnosis found.   Date: 05/26/2011  Rate: 60  Rhythm: normal sinus rhythm  QRS Axis: normal  Intervals: normal  ST/T Wave abnormalities: normal  Conduction Disutrbances:none  Narrative Interpretation: PVC  Old EKG Reviewed:  unchanged    MDM  Patient appears to have a near syncopal episode. Workup initiated. Also discussed with my attending, who agrees with plan.  6:06 PM Patient has normal orthostatic vital sign.  She does have elevated creatinine and decreased GFR consistence with dehydration.  Currently patient is given IV fluid hydration. Patient's also able to tolerate by mouth. Patient has no focal neuro deficit. Blood pressure improves.    8:44 PM My attending has seen the patient and felt the patient is a safe to go home.  Patient able to ambulate, and tolerated by mouth. I recommend for patients to call continue metoprolol but hold her amlodipine and Hyzaar until she can follow up with her doctor.  She has also agree to f/u with dr. Lottie Mussel for her GI complaints.    Fayrene Helper, [place] 05/26/11 2049

## 2011-05-26 NOTE — ED Notes (Signed)
Reports while backing a cake sudden onset at 1200, felt really hot, flushed, dizzy, lightheaded, generalized weakness & felt faint. Then nausea, no vomiting. Denies CP, palpitations, SOB, h/a. Denies dizziness, weakness presently but c/o feeling lightheaded.  Denies recent med changes, illness, fever, n/v/d. Reports IV zofran given by EMS improved nausea

## 2011-05-26 NOTE — ED Notes (Signed)
Patient is resting comfortably. 

## 2011-05-26 NOTE — ED Notes (Signed)
Pt reports checked BP afterwards & it was 88/45

## 2011-05-26 NOTE — ED Notes (Signed)
Patient transported to X-ray 

## 2011-05-27 MED ORDER — FENTANYL CITRATE 0.05 MG/ML IJ SOLN
INTRAMUSCULAR | Status: AC
  Filled 2011-05-27: qty 2

## 2011-05-27 NOTE — ED Provider Notes (Signed)
Patient has history and physical findings consistent with transient hypo-tension that has improved spontaneously and additionally has been treated with IV fluids in the emergency department. She normally has hypertension. Therefore, her apparent hypotension is likely volume related since she has an elevated creatinine.  Medical screening examination/treatment/procedure(s) were conducted as a shared visit with non-physician practitioner(s) and myself.  I personally evaluated the patient during the encounter  Flint Melter, MD 05/27/11 [phone]

## 2011-06-12 ENCOUNTER — Ambulatory Visit
Admission: RE | Admit: 2011-06-12 | Discharge: 2011-06-12 | Disposition: A | Discharge status: Home / Self Care | Payer: Medicare Other | Source: Ambulatory Visit | Attending: Gynecology | Admitting: Gynecology

## 2011-06-12 DIAGNOSIS — Z1231 Encounter for screening mammogram for malignant neoplasm of breast: Secondary | ICD-10-CM

## 2011-06-26 LAB — HM COLONOSCOPY

## 2011-07-10 HISTORY — PX: UPPER GASTROINTESTINAL ENDOSCOPY: SHX188

## 2011-08-07 ENCOUNTER — Other Ambulatory Visit: Payer: Self-pay | Admitting: Cardiovascular Disease

## 2011-08-07 MED ORDER — LOSARTAN POTASSIUM-HCTZ 100-25 MG PO TABS: 1.0000 | ORAL_TABLET | Freq: Every day | ORAL | Status: DC

## 2011-08-23 DIAGNOSIS — K253 Acute gastric ulcer without hemorrhage or perforation: Secondary | ICD-10-CM | POA: Diagnosis not present

## 2011-08-23 DIAGNOSIS — L539 Erythematous condition, unspecified: Secondary | ICD-10-CM | POA: Diagnosis not present

## 2011-08-23 DIAGNOSIS — K449 Diaphragmatic hernia without obstruction or gangrene: Secondary | ICD-10-CM | POA: Diagnosis not present

## 2011-08-23 DIAGNOSIS — D131 Benign neoplasm of stomach: Secondary | ICD-10-CM | POA: Diagnosis not present

## 2011-08-23 DIAGNOSIS — K259 Gastric ulcer, unspecified as acute or chronic, without hemorrhage or perforation: Secondary | ICD-10-CM | POA: Diagnosis not present

## 2011-08-28 ENCOUNTER — Ambulatory Visit (INDEPENDENT_AMBULATORY_CARE_PROVIDER_SITE_OTHER): Payer: Medicare Other | Admitting: Internal Medicine

## 2011-08-28 ENCOUNTER — Encounter: Payer: Self-pay | Admitting: Internal Medicine

## 2011-08-28 DIAGNOSIS — N289 Disorder of kidney and ureter, unspecified: Secondary | ICD-10-CM | POA: Insufficient documentation

## 2011-08-28 DIAGNOSIS — J329 Chronic sinusitis, unspecified: Secondary | ICD-10-CM | POA: Diagnosis not present

## 2011-08-28 DIAGNOSIS — M7631 Iliotibial band syndrome, right leg: Secondary | ICD-10-CM | POA: Insufficient documentation

## 2011-08-28 DIAGNOSIS — G8929 Other chronic pain: Secondary | ICD-10-CM

## 2011-08-28 DIAGNOSIS — M949 Disorder of cartilage, unspecified: Secondary | ICD-10-CM | POA: Diagnosis not present

## 2011-08-28 DIAGNOSIS — E2839 Other primary ovarian failure: Secondary | ICD-10-CM | POA: Diagnosis not present

## 2011-08-28 DIAGNOSIS — M25559 Pain in unspecified hip: Secondary | ICD-10-CM | POA: Insufficient documentation

## 2011-08-28 DIAGNOSIS — M25569 Pain in unspecified knee: Secondary | ICD-10-CM | POA: Diagnosis not present

## 2011-08-28 DIAGNOSIS — M899 Disorder of bone, unspecified: Secondary | ICD-10-CM | POA: Diagnosis not present

## 2011-08-28 LAB — HM PAP SMEAR

## 2011-08-28 MED ORDER — AMOXICILLIN-POT CLAVULANATE 875-125 MG PO TABS: 1.0000 | ORAL_TABLET | Freq: Two times a day (BID) | ORAL | Status: AC

## 2011-08-28 NOTE — Assessment & Plan Note (Signed)
Attempt course of augmentin. Followup if no improvement or worsening. 

## 2011-08-28 NOTE — Assessment & Plan Note (Signed)
Felt to be overall low risk for surgery and no medical contraindication for proceeding pending ekg/preop labs per ortho. She is going to call her cardiologist as well to get his input.

## 2011-08-28 NOTE — Progress Notes (Signed)
  Subjective:    Patient ID: Heather [name], female    DOB: Apr 18, 1935, 77 y.o.   MRN: [id number]  HPI Pt presents to clinic for followup of multiple medical problems. Former pt of Dr. Scotty Court. Notes worsening chronic left knee pain followed by orthopedics. Has been recommended for left knee surgery in near future. States was told to obtain lab/ekg through hospital. No known rxn to anesthesia, coagulopathy and denies recent chest pain/pressure, dyspnea or palpitations. Known h/o CAD s/p MI 2006 with subsequent stenting. Followed by cardiology with last nuclear stress tests 07/2009 and without ischemia. Last chem7 with mildly increased creatinine taking arb/hctz combination. Also notes 2 month h/o left nostril yellow drainage and is blood tinged. No sinus pain/pressure, fever or chills.   Past Medical History  Diagnosis Date  . Coronary artery disease     post percutaneous coronary intervention in 2006  . Transient ischemic attack (TIA)   . Hypertension   . Hyperlipidemia   . Peripheral edema   . Palpitations   . Restless legs   . Fatigue   . GERD (gastroesophageal reflux disease)   . Persistent disorder of initiating or maintaining sleep   . Dermatitis   . H/O: hysterectomy   . S/P inguinal herniorrhaphy   . H/O: knee surgery   . Atrial fibrillation    Past Surgical History  Procedure Date  . Abdominal hysterectomy   . Hernia repair   . Knee arthroscopy     2 on left and 1 on right  . Foot surgery     left foot stress fracture repair    reports that she has never smoked. She has never used smokeless tobacco. She reports that she does not drink alcohol or use illicit drugs. family history includes Arthritis in an unspecified family member; Cancer in her brother; Coronary artery disease in her mother; Hypertension in an unspecified family member; Other in an unspecified family member; and Stroke in her father.  There is no history of Breast cancer and Colon cancer. No Known  Allergies    Review of Systems see hpi     Objective:   Physical Exam  Nursing note and vitals reviewed. Constitutional: She appears well-developed and well-nourished. No distress.  HENT:  Head: Normocephalic and atraumatic.  Right Ear: External ear normal.  Left Ear: External ear normal.  Nose: Nose normal.  Mouth/Throat: Oropharynx is clear and moist. No oropharyngeal exudate.  Eyes: Conjunctivae are normal. No scleral icterus.  Neck: No JVD present. Carotid bruit is not present.  Cardiovascular: Normal rate, regular rhythm and normal heart sounds.  Exam reveals no gallop and no friction rub.   No murmur heard. Pulmonary/Chest: Effort normal and breath sounds normal. No respiratory distress. She has no wheezes. She has no rales.  Neurological: She is alert.  Skin: Skin is warm. She is not diaphoretic.  Psychiatric: She has a normal mood and affect.          Assessment & Plan:

## 2011-08-28 NOTE — Assessment & Plan Note (Signed)
Obtain chem 7 with next visit 

## 2011-09-03 ENCOUNTER — Encounter: Payer: Self-pay | Admitting: Cardiovascular Disease

## 2011-09-11 DIAGNOSIS — H2510 Age-related nuclear cataract, unspecified eye: Secondary | ICD-10-CM | POA: Diagnosis not present

## 2011-09-18 ENCOUNTER — Other Ambulatory Visit: Payer: Self-pay | Admitting: Physician Assistant

## 2011-09-18 ENCOUNTER — Encounter: Payer: Self-pay | Admitting: Physician Assistant

## 2011-09-18 DIAGNOSIS — M1710 Unilateral primary osteoarthritis, unspecified knee: Secondary | ICD-10-CM | POA: Diagnosis not present

## 2011-09-18 DIAGNOSIS — M179 Osteoarthritis of knee, unspecified: Secondary | ICD-10-CM | POA: Insufficient documentation

## 2011-09-18 NOTE — H&P (Signed)
Heather [name] is an 77 y.o. female.   Chief Complaint: left knee djd HPI: Heather [name] is seen for follow-up having significant increasing left knee pain. She had a left knee arthroscopy almost 2 years ago and was found to have grade III and IV chondromalacia and degenerative joint disease. She was seen last on 01/30/11 at which time her knee was injected but despite this she has significant pain. She has pain with weightbearing and activity relieved by rest but this is getting progressively worse.  The excruciating pain is getting progressively worse, limiting activities of daily living, poses a significant fall risk, and has failed multiple conservative treatments including intraarticular cortisone injections, intraarticular Supartz, bracing, medication and home physical therapy.  Past Medical History  Diagnosis Date  . Coronary artery disease     post percutaneous coronary intervention in 2006  . Transient ischemic attack (TIA)   . Hypertension   . Hyperlipidemia   . Peripheral edema   . Palpitations   . Restless legs   . Fatigue   . GERD (gastroesophageal reflux disease)   . Persistent disorder of initiating or maintaining sleep   . Dermatitis   . H/O: hysterectomy   . S/P inguinal herniorrhaphy   . H/O: knee surgery   . Atrial fibrillation   . DJD (degenerative joint disease) of knee 09/18/2011    Past Surgical History  Procedure Date  . Abdominal hysterectomy   . Hernia repair   . Knee arthroscopy     2 on left and 1 on right  . Foot surgery     left foot stress fracture repair    Family History  Problem Relation Age of Onset  . Arthritis      family hx of  . Hypertension      family hx of  . Stroke Father     family hx of M 1st degree relative <50  . Other      family hx of cardiovascular disorder  . Coronary artery disease Mother   . Heart disease Mother   . Cancer Brother     bladder  . Breast cancer Neg Hx   . Colon cancer Neg Hx   . Stroke Brother   .  Cancer Brother    Social History:  reports that she has never smoked. She has never used smokeless tobacco. She reports that she does not drink alcohol or use illicit drugs.  Allergies: No Known Allergies  Medications Prior to Admission  Medication Sig Dispense Refill  . amLODipine (NORVASC) 10 MG tablet Take 10 mg by mouth daily.        . isosorbide mononitrate (IMDUR) 60 MG 24 hr tablet Take 30 mg by mouth daily.        Marland Kitchen losartan-hydrochlorothiazide (HYZAAR) 100-25 MG per tablet Take 1 tablet by mouth daily.  90 tablet  2  . magnesium oxide (MAG-OX) 400 MG tablet Take 400 mg by mouth 2 (two) times daily.       . metoprolol (TOPROL-XL) 100 MG 24 hr tablet TAKE 1 TABLET BY MOUTH TWICE A DAY  180 tablet  2  . nitroGLYCERIN (NITROSTAT) 0.4 MG SL tablet Place 0.4 mg under the tongue every 5 (five) minutes as needed.        Marland Kitchen omeprazole (PRILOSEC) 40 MG capsule Take 40 mg by mouth 2 (two) times daily.       . potassium chloride SA (K-DUR,KLOR-CON) 20 MEQ tablet Take 20 mEq by mouth daily.        Marland Kitchen  pramipexole (MIRAPEX) 0.25 MG tablet Take 0.25 mg by mouth 2 (two) times daily.        . simvastatin (ZOCOR) 20 MG tablet Take 20 mg by mouth at bedtime.        . traMADol (ULTRAM) 50 MG tablet        No current facility-administered medications on file as of 09/18/2011.    No results found for this or any previous visit (from the past 48 hour(s)). No results found.  Review of Systems  Constitutional: Negative.   HENT: Negative.   Eyes: Negative.   Respiratory: Negative for cough, hemoptysis, sputum production, shortness of breath and wheezing.   Cardiovascular: Positive for palpitations. Negative for chest pain, orthopnea, claudication, leg swelling and PND.  Gastrointestinal: Positive for heartburn. Negative for nausea, vomiting, abdominal pain, diarrhea, constipation, blood in stool and melena.  Genitourinary: Negative for dysuria, urgency, frequency, hematuria and flank pain.    Musculoskeletal: Positive for joint pain.       Bilateral knee pain left worse than right  Skin: Negative.   Neurological: Negative.   Endo/Heme/Allergies: Negative.   Psychiatric/Behavioral: Negative.     Blood pressure 149/75, pulse 66, temperature 97.7 F (36.5 C), temperature source Oral, height 5\' 3"  (1.6 m), weight 76.658 kg (169 lb), SpO2 95.00%. Physical Exam  Constitutional: She is oriented to person, place, and time. She appears well-developed and well-nourished.  HENT:  Head: Normocephalic and atraumatic.  Mouth/Throat: Oropharynx is clear and moist.  Eyes: Conjunctivae and EOM are normal. Pupils are equal, round, and reactive to light.  Neck: Neck supple.  Cardiovascular: Normal rate, regular rhythm and normal heart sounds.   Respiratory: Effort normal and breath sounds normal.  GI: Soft. Bowel sounds are normal.  Genitourinary:       Not pertinent to current symptomatology therefore not examined.  Musculoskeletal:       Examination of her left knee reveals 1 to 2+ crepitation 1+ synovitis range of motion 0-125 degrees knee is stable with diffuse pain and normal patella tracking. Exam of the right knee reveals 1+ crepitation 1+ synovitis range of motion 0-120 degrees knee is stable with normal patella tracking. Vascular exam: pulses 2+ and symmetric.  Neurological: She is alert and oriented to person, place, and time. She has normal reflexes.  Skin: Skin is warm and dry.  Psychiatric: She has a normal mood and affect. Her behavior is normal. Judgment and thought content normal.     Assessment Patient Active Problem List  Diagnoses  . PURE HYPERCHOLESTEROLEMIA  . HYPERLIPIDEMIA  . PERSISTENT DISORDER INITIATING/MAINTAINING SLEEP  . RESTLESS LEGS SYNDROME  . HYPERTENSION  . CAD  . CORONARY ATHEROSCLEROSIS NATIVE CORONARY ARTERY  . GERD  . DERMATITIS  . FATIGUE  . PERIPHERAL EDEMA  . PALPITATIONS  . TRANSIENT ISCHEMIC ATTACK, HX OF  . Chronic knee pain  .  Rhinosinusitis  . Renal insufficiency  . DJD (degenerative joint disease) of knee   XRay: AP,LATERAL, FLEXION, and SUNRISE views show significant joint space narrowing, with periarticular osteophytes, and subchondral sclerosis.  Plan  I talked to her and her husband about this in detail. Would recommend with her significant pain and lack of response to conservative care that we proceed with left total knee replacement. Discussed risks benefits and possible complications of the surgery in detail and they understand this completely.  Mar Zettler J 09/18/2011, 3:03 PM

## 2011-09-19 ENCOUNTER — Encounter (HOSPITAL_COMMUNITY): Payer: Self-pay | Admitting: Pharmacy Technician

## 2011-09-20 ENCOUNTER — Other Ambulatory Visit: Payer: Self-pay | Admitting: Cardiovascular Disease

## 2011-09-20 MED ORDER — SIMVASTATIN 20 MG PO TABS: 20.0000 mg | ORAL_TABLET | Freq: Every day | ORAL | Status: DC

## 2011-09-24 ENCOUNTER — Encounter (HOSPITAL_COMMUNITY): Payer: Self-pay | Admitting: Vascular Surgery

## 2011-09-24 ENCOUNTER — Encounter (HOSPITAL_COMMUNITY): Payer: Self-pay

## 2011-09-24 ENCOUNTER — Encounter (HOSPITAL_COMMUNITY)
Admission: RE | Admit: 2011-09-24 | Discharge: 2011-09-24 | Disposition: A | Discharge status: Home / Self Care | Payer: Medicare Other | Source: Ambulatory Visit | Attending: Orthopedic Surgery | Admitting: Orthopedic Surgery

## 2011-09-24 ENCOUNTER — Telehealth: Payer: Self-pay | Admitting: *Deleted

## 2011-09-24 ENCOUNTER — Other Ambulatory Visit: Payer: Self-pay

## 2011-09-24 DIAGNOSIS — Z01818 Encounter for other preprocedural examination: Secondary | ICD-10-CM | POA: Insufficient documentation

## 2011-09-24 HISTORY — DX: Cerebral infarction, unspecified: I63.9

## 2011-09-24 LAB — TYPE AND SCREEN
ABO/RH(D): O NEG
Antibody Screen: NEGATIVE

## 2011-09-24 LAB — URINALYSIS, ROUTINE W REFLEX MICROSCOPIC
Bilirubin Urine: NEGATIVE
Hgb urine dipstick: NEGATIVE
Ketones, ur: NEGATIVE mg/dL
Nitrite: NEGATIVE
Protein, ur: NEGATIVE mg/dL
Specific Gravity, Urine: 1.014 (ref 1.005–1.030)
Urobilinogen, UA: 0.2 mg/dL (ref 0.0–1.0)

## 2011-09-24 LAB — ABO/RH: ABO/RH(D): O NEG

## 2011-09-24 LAB — COMPREHENSIVE METABOLIC PANEL
ALT: 19 U/L (ref 0–35)
AST: 19 U/L (ref 0–37)
Calcium: 9.5 mg/dL (ref 8.4–10.5)
Creatinine, Ser: 0.88 mg/dL (ref 0.50–1.10)
GFR calc Af Amer: 73 mL/min — ABNORMAL LOW (ref >90)
GFR calc non Af Amer: 63 mL/min — ABNORMAL LOW (ref >90)
Glucose, Bld: 187 mg/dL — ABNORMAL HIGH (ref 70–99)
Sodium: 137 mEq/L (ref 135–145)
Total Protein: 6.9 g/dL (ref 6.0–8.3)

## 2011-09-24 LAB — CBC
MCH: 31 pg (ref 26.0–34.0)
MCHC: 34.5 g/dL (ref 30.0–36.0)
MCV: 89.7 fL (ref 78.0–100.0)
Platelets: 184 10*3/uL (ref 150–400)

## 2011-09-24 LAB — DIFFERENTIAL
Basophils Absolute: 0 10*3/uL (ref 0.0–0.1)
Eosinophils Relative: 3 % (ref 0–5)
Lymphocytes Relative: 28 % (ref 12–46)
Lymphs Abs: 2.1 10*3/uL (ref 0.7–4.0)
Neutro Abs: 4.6 10*3/uL (ref 1.7–7.7)

## 2011-09-24 LAB — SURGICAL PCR SCREEN
MRSA, PCR: NEGATIVE
Staphylococcus aureus: NEGATIVE

## 2011-09-24 LAB — PROTIME-INR: Prothrombin Time: 12.8 seconds (ref 11.6–15.2)

## 2011-09-24 NOTE — Telephone Encounter (Signed)
I reviewed the patient's labs and EKG, both performed today. I don't see any problems on either of these. The patient's vital signs were normal today. Unless the patient is having symptoms of chest pain, chest pressure, or shortness of breath, I don't think she requires further cardiac evaluation at this time. I have seen her within the past 6 months and she was free of cardiopulmonary symptoms at that time. I am unable to see the patient before her scheduled surgery later this week. However, I would be happy to see her for a next available appointment if anesthesiology feels it is necessary to delay her surgery.

## 2011-09-24 NOTE — Telephone Encounter (Signed)
Dr Excell Seltzer documented clearance letter on 09/03/11.  I will forward this message to Dr Excell Seltzer to make him aware of BP concerns with anesthesia.

## 2011-09-24 NOTE — Progress Notes (Signed)
Call to A. Zelenak,[facility], reported pt. Was last seen by cardio. 04/2011 & told to return in one yr./ if she is well. But she was seen in ER 11/12 for weakness & told that she was dehydrated.  Since then she has continued to have weakness on & off & last was on 09/21/2011.  BP taken at home 80/50, laid down & drank fluids.  She has not reported this to her cardiologist. Call will be made to Cardio, to report the same.

## 2011-09-24 NOTE — Progress Notes (Signed)
Call to [facility], nurse at [facility], reported pt. History & recent report of weakness & hypotension on 09/21/2011.  She agrees to send this message to Dr. Excell Seltzer & his nurse. She reported that they will call back to let me know about the outcome.

## 2011-09-24 NOTE — Pre-Procedure Instructions (Signed)
22 Heather [name]  09/24/2011   Your procedure is scheduled on:  10/01/2011  Report to [facility] at 5:30 AM.  Call this number if you have problems the morning of surgery: [phone]   Remember:   Do not eat food:After Midnight.  May have clear liquids: up to 4 Hours before arrival.  Clear liquids include soda, tea, black coffee, apple or grape juice, broth.  Take these medicines the morning of surgery with A SIP OF WATER: IMDUR(isosorbide), mirapex, prilosec, METOPROLOL, NORVASC   Do not wear jewelry, make-up or nail polish.  Do not wear lotions, powders, or perfumes. You may wear deodorant.  Do not shave 48 hours prior to surgery.  Do not bring valuables to the hospital.  Contacts, dentures or bridgework may not be worn into surgery.  Leave suitcase in the car. After surgery it may be brought to your room.  For patients admitted to the hospital, checkout time is 11:00 AM the day of discharge.   Patients discharged the day of surgery will not be allowed to drive home.  Name and phone number of your driver: with [employer]  Special Instructions: CHG Shower Use Special Wash: 1/2 bottle night before surgery and 1/2 bottle morning of surgery.   Please read over the following fact sheets that you were given: Pain Booklet, Coughing and Deep Breathing, Blood Transfusion Information, MRSA Information and Surgical Site Infection Prevention

## 2011-09-24 NOTE — Telephone Encounter (Signed)
Received a call from Beth at short stay.  Patient is scheduled for knee surgery on 09/28/11.  Was seen in October and cleared for surgery by Dr Excell Seltzer for 1 year.   Patient went to emergency room in November for weakness and was told she was dehydrated.  Per Beth RN at short stay patient in today and stated she had another one of these weak spells on Friday and took blood pressure 80/50 per home monitor. Blood pressure today at short stay was 146/70 HR 61.  She felt she was probably getting dehydrated again so she increased her fluids. Patient has had several episodes since November and never called in.  Anesthesiologist felt Dr Excell Seltzer needed to be made aware of this so he could see the patient if he felt necessary.  Will forward to Dr Excell Seltzer and Leotis Shames RN for review.

## 2011-09-25 LAB — URINE CULTURE

## 2011-09-25 NOTE — Consult Note (Signed)
Anesthesia: Patient is a 77 year old female scheduled for a left TKR on 10/01/11.  Her history includes HTN, CAD s/p LCx and RCA stents (DES) in '06 and moderately severe LAD stenosis being managed medically by Dr. Excell Seltzer, TIA in 2000 with reportedly negative work-up at [facility], afib, non-smoker, HLD, GERD.  She did report episodes of generalized weakness and hypotension since November 2012 as outlined below.  (I did not see her at her PAT appointment, but I did call her today for more information.)  She last saw Dr. Excell Seltzer on 05/04/11 and by notes he felt she was stable.  No changes were made in her medical management.  On 05/26/11 she developed weakness and hypotension and presented to the ED.  Her Cr was elevated at 1.6, and she was ultimately diagnosed with dehydration.  Since then she has had intermittent episodes where she will feel hot and generally weak, lie down, drink fluids, and feel better within a hour.  She is able to check her BP at home and notices that during these episode her BP can be as low as 90/50 (usually runs 130-140/70s), but her HR does not read exceptionally high or low.  She has never felt like she was going to pass out and denies associated symptoms such as CP, SOB, and palpitations.  Her last episode was approximately one week ago.  She has been completely asymptomatic over the past several days and was able to do her house cleaning today without any difficulty. Yesterday her PAT nurse did alert Dr. Excell Seltzer who reviewed her labs, VS, EKG.  He was not able to see her again pre-operatively, but felt that unless she was having significant symptoms such as CP or SOB that he would not anticipate that any additional cardiac work-up would be needed pre-operatively.  EKG yesterday showed SB at 57 bpm, incomplete right BBB, moderate voltage criteria for LVH.  Stress test on 08/03/09 showed no sign of scar or ischemia, EF 70%.  Cath on 06/27/05 showed: 1. Successful percutaneous intervention  of the right coronary artery.  2. Previous successful percutaneous intervention of the circumflex coronary artery with continued patency.  3. Continued moderately high-grade (70-80%) left anterior descending artery disease in the midvessel, but with a small-caliber vessel and overlapping side branch at the location. (Medical therapy was ultimately recommended.)  Labs now show a normal Cr of 0.88.  H/H are WNL.  Her glucose is 187 (she denies history of DM, but said she had just drank a soda prior to her lab draw.)  CXR on 05/26/11 showed no evidence of acute cardiopulmonary disease.   She is currently asymptomatic and has not had any recent chest pain, palpitations, syncope, or shortness of breath.  She is mildly bradycardic, but this does not appear far from her baseline.  It has been about a week since her last hypotensive episode.  Dr. Excell Seltzer was notified of these episodes and has not recommended any specific action unless felt necessary by the Anesthesiologist.  She does not seem to be having any progressive symptoms currently.  She feels well and has not had to limit her activity.  I reviewed above with Anesthesiologist Dr. Chaney Malling.  If she remains asymptomatic then anticipate she can proceed. She was instructed to contact Dr. Excell Seltzer and Dr. Thurston Hole if she developed any progressive or new symptoms, which could indicate that further work-up may be indicated.  I also updated Kirstin Shepperson, PA-C of above.  She will discuss further with Dr. Thurston Hole and  let Heather [name] know if they would like her to be re-evaluated by Dr. Excell Seltzer pre-operatively.

## 2011-09-26 DIAGNOSIS — I10 Essential (primary) hypertension: Secondary | ICD-10-CM | POA: Diagnosis not present

## 2011-09-26 DIAGNOSIS — M1710 Unilateral primary osteoarthritis, unspecified knee: Secondary | ICD-10-CM | POA: Diagnosis not present

## 2011-09-26 NOTE — Telephone Encounter (Signed)
I spoke with Olegario Messier in [place] [facility] and made her aware that Dr Excell Seltzer reviewed pt's chart and pt can proceed with surgery.

## 2011-10-01 ENCOUNTER — Encounter (HOSPITAL_COMMUNITY): Admission: RE | Payer: Self-pay | Source: Ambulatory Visit

## 2011-10-01 ENCOUNTER — Ambulatory Visit (HOSPITAL_COMMUNITY): Admission: RE | Admit: 2011-10-01 | Payer: Medicare Other | Source: Ambulatory Visit | Admitting: Orthopedic Surgery

## 2011-10-01 ENCOUNTER — Encounter: Payer: Self-pay | Admitting: Internal Medicine

## 2011-10-01 ENCOUNTER — Other Ambulatory Visit: Payer: Self-pay | Admitting: Internal Medicine

## 2011-10-01 ENCOUNTER — Ambulatory Visit (INDEPENDENT_AMBULATORY_CARE_PROVIDER_SITE_OTHER): Payer: Medicare Other | Admitting: Internal Medicine

## 2011-10-01 VITALS — BP 118/80 | HR 62 | Temp 97.7°F | Resp 18 | Wt 169.0 lb

## 2011-10-01 DIAGNOSIS — R7309 Other abnormal glucose: Secondary | ICD-10-CM

## 2011-10-01 DIAGNOSIS — R109 Unspecified abdominal pain: Secondary | ICD-10-CM | POA: Diagnosis not present

## 2011-10-01 DIAGNOSIS — R739 Hyperglycemia, unspecified: Secondary | ICD-10-CM

## 2011-10-01 LAB — HEPATIC FUNCTION PANEL
AST: 20 U/L (ref 0–37)
Bilirubin, Direct: 0.1 mg/dL (ref 0.0–0.3)
Indirect Bilirubin: 0.4 mg/dL (ref 0.0–0.9)
Total Bilirubin: 0.5 mg/dL (ref 0.3–1.2)

## 2011-10-01 LAB — AMYLASE: Amylase: 56 U/L (ref 0–105)

## 2011-10-01 LAB — BASIC METABOLIC PANEL
BUN: 20 mg/dL (ref 6–23)
Creat: 0.96 mg/dL (ref 0.50–1.10)
Potassium: 3.7 mEq/L (ref 3.5–5.3)

## 2011-10-01 SURGERY — ARTHROPLASTY, KNEE, TOTAL
Anesthesia: General | Laterality: Left

## 2011-10-01 MED ORDER — CIPROFLOXACIN HCL 500 MG PO TABS: 500.0000 mg | ORAL_TABLET | Freq: Two times a day (BID) | ORAL | Status: AC

## 2011-10-01 MED ORDER — METRONIDAZOLE 500 MG PO TABS: 500.0000 mg | ORAL_TABLET | Freq: Three times a day (TID) | ORAL | Status: AC

## 2011-10-02 ENCOUNTER — Ambulatory Visit (HOSPITAL_BASED_OUTPATIENT_CLINIC_OR_DEPARTMENT_OTHER)
Admission: RE | Admit: 2011-10-02 | Discharge: 2011-10-02 | Disposition: A | Discharge status: Home / Self Care | Payer: Medicare Other | Source: Ambulatory Visit | Attending: Internal Medicine | Admitting: Internal Medicine

## 2011-10-02 ENCOUNTER — Ambulatory Visit: Payer: BLUE CROSS/BLUE SHIELD | Admitting: Internal Medicine

## 2011-10-02 DIAGNOSIS — K439 Ventral hernia without obstruction or gangrene: Secondary | ICD-10-CM | POA: Diagnosis not present

## 2011-10-02 DIAGNOSIS — K449 Diaphragmatic hernia without obstruction or gangrene: Secondary | ICD-10-CM | POA: Diagnosis not present

## 2011-10-02 DIAGNOSIS — R197 Diarrhea, unspecified: Secondary | ICD-10-CM | POA: Insufficient documentation

## 2011-10-02 DIAGNOSIS — K5730 Diverticulosis of large intestine without perforation or abscess without bleeding: Secondary | ICD-10-CM | POA: Diagnosis not present

## 2011-10-02 DIAGNOSIS — R911 Solitary pulmonary nodule: Secondary | ICD-10-CM | POA: Insufficient documentation

## 2011-10-02 DIAGNOSIS — R109 Unspecified abdominal pain: Secondary | ICD-10-CM | POA: Insufficient documentation

## 2011-10-02 DIAGNOSIS — K429 Umbilical hernia without obstruction or gangrene: Secondary | ICD-10-CM | POA: Insufficient documentation

## 2011-10-02 DIAGNOSIS — R634 Abnormal weight loss: Secondary | ICD-10-CM | POA: Diagnosis not present

## 2011-10-02 DIAGNOSIS — K8020 Calculus of gallbladder without cholecystitis without obstruction: Secondary | ICD-10-CM | POA: Diagnosis not present

## 2011-10-02 LAB — CBC WITH DIFFERENTIAL/PLATELET
Basophils Absolute: 0 10*3/uL (ref 0.0–0.1)
Basophils Relative: 0 % (ref 0–1)
HCT: 42.7 % (ref 36.0–46.0)
Lymphocytes Relative: 26 % (ref 12–46)
MCHC: 33.7 g/dL (ref 30.0–36.0)
Monocytes Absolute: 0.6 10*3/uL (ref 0.1–1.0)
Neutro Abs: 5.4 10*3/uL (ref 1.7–7.7)
Platelets: 189 10*3/uL (ref 150–400)
RDW: 12.8 % (ref 11.5–15.5)
WBC: 8.6 10*3/uL (ref 4.0–10.5)

## 2011-10-02 LAB — HEMOGLOBIN A1C
Hgb A1c MFr Bld: 7.1 % — ABNORMAL HIGH (ref <5.7)
Mean Plasma Glucose: 157 mg/dL — ABNORMAL HIGH (ref <117)

## 2011-10-02 MED ORDER — IOHEXOL 300 MG/ML  SOLN
100.0000 mL | Freq: Once | INTRAMUSCULAR | Status: AC | PRN
  Administered 2011-10-02: 100 mL via INTRAVENOUS

## 2011-10-04 ENCOUNTER — Ambulatory Visit (INDEPENDENT_AMBULATORY_CARE_PROVIDER_SITE_OTHER): Payer: Medicare Other | Admitting: Internal Medicine

## 2011-10-04 ENCOUNTER — Encounter: Payer: Self-pay | Admitting: Internal Medicine

## 2011-10-04 ENCOUNTER — Ambulatory Visit (INDEPENDENT_AMBULATORY_CARE_PROVIDER_SITE_OTHER): Payer: Medicare Other | Admitting: Cardiovascular Disease

## 2011-10-04 ENCOUNTER — Encounter: Payer: Self-pay | Admitting: Cardiovascular Disease

## 2011-10-04 VITALS — BP 110/68 | HR 66 | Temp 97.6°F | Resp 18

## 2011-10-04 VITALS — BP 132/56 | HR 70 | Ht 63.0 in | Wt 170.8 lb

## 2011-10-04 DIAGNOSIS — R55 Syncope and collapse: Secondary | ICD-10-CM | POA: Diagnosis not present

## 2011-10-04 DIAGNOSIS — R109 Unspecified abdominal pain: Secondary | ICD-10-CM | POA: Diagnosis not present

## 2011-10-04 DIAGNOSIS — R911 Solitary pulmonary nodule: Secondary | ICD-10-CM

## 2011-10-04 DIAGNOSIS — I2510 Atherosclerotic heart disease of native coronary artery without angina pectoris: Secondary | ICD-10-CM

## 2011-10-04 DIAGNOSIS — I10 Essential (primary) hypertension: Secondary | ICD-10-CM | POA: Diagnosis not present

## 2011-10-04 DIAGNOSIS — E119 Type 2 diabetes mellitus without complications: Secondary | ICD-10-CM | POA: Diagnosis not present

## 2011-10-04 NOTE — Patient Instructions (Addendum)
Your physician has requested that you have a dobutamine echocardiogram. For further information please visit [email or web address] Please follow instruction sheet as given.  Your physician recommends that you schedule a follow-up appointment in: 3 months with Dr. Excell Seltzer.   Near-Syncope Near-syncope is sudden weakness, dizziness, or feeling like you might pass out (faint). This may occur when getting up after sitting or while standing for a long period of time. Near-syncope can be caused by a drop in blood pressure. This is a common reaction, but it may occur to a greater degree in people taking medicines to control their blood pressure. Fainting often occurs when the blood pressure or pulse is too low to provide enough blood flow to the brain to keep you conscious. Fainting and near-syncope are not usually due to serious medical problems. However, certain people should be more cautious in the event of near-syncope, including elderly patients, patients with diabetes, and patients with a history of heart conditions (especially irregular rhythms).  CAUSES   Drop in blood pressure.   Physical pain.   Dehydration.   Heat exhaustion.   Emotional distress.   Low blood sugar.   Internal bleeding.   Heart and circulatory problems.   Infections.  SYMPTOMS   Dizziness.   Feeling sick to your stomach (nauseous).   Nearly fainting.   Body numbness.   Turning pale.   Tunnel vision.   Weakness.  HOME CARE INSTRUCTIONS   Lie down right away if you start feeling like you might faint. Breathe deeply and steadily. Wait until all the symptoms have passed. Most of these episodes last only a few minutes. You may feel tired for several hours.   Drink enough fluids to keep your urine clear or pale yellow.   If you are taking blood pressure or heart medicine, get up slowly, taking several minutes to sit and then stand. This can reduce dizziness that is caused by a drop in blood pressure.  SEEK  IMMEDIATE MEDICAL CARE IF:   You have a severe headache.   Unusual pain develops in the chest, abdomen, or back.   There is bleeding from the mouth or rectum, or you have black or tarry stool.   An irregular heartbeat or a very rapid pulse develops.   You have repeated fainting or seizure-like jerking during an episode.   You faint when sitting or lying down.   You develop confusion.   You have difficulty walking.   Severe weakness develops.   Vision problems develop.  MAKE SURE YOU:   Understand these instructions.   Will watch your condition.   Will get help right away if you are not doing well or get worse.  Document Released: 06/25/2005 Document Revised: 06/14/2011 Document Reviewed: 08/11/2010 [facility] Patient Information 2012 [place], [place].

## 2011-10-06 DIAGNOSIS — R1013 Epigastric pain: Secondary | ICD-10-CM | POA: Insufficient documentation

## 2011-10-06 DIAGNOSIS — R911 Solitary pulmonary nodule: Secondary | ICD-10-CM | POA: Insufficient documentation

## 2011-10-06 DIAGNOSIS — E119 Type 2 diabetes mellitus without complications: Secondary | ICD-10-CM | POA: Insufficient documentation

## 2011-10-06 DIAGNOSIS — G8929 Other chronic pain: Secondary | ICD-10-CM | POA: Insufficient documentation

## 2011-10-06 NOTE — Assessment & Plan Note (Signed)
Improved with abx tx. Continue to completion. Proceed with gi consult if no further improvement or resolution with abx.

## 2011-10-06 NOTE — Progress Notes (Signed)
  Subjective:    Patient ID: Heather [name], female    DOB: September 11, 1934, 77 y.o.   MRN: [id number]  HPI Pt presents to clinic for close f/u of abdominal pain. Tolerating cipro/flagyl without adverse effect. abd pain mildly better after only 2d of abx. Loose stools unchanged. No f/c or blood in stool. abd ct reviewed without acute finding for abd pain. Did review incidental finding of small pulmonary nodule. Reviewed recent noted hyperglycemia and a1c 7.1. No personal h/o dm.   Past Medical History  Diagnosis Date  . Coronary artery disease     post percutaneous coronary intervention in 2006  . Transient ischemic attack (TIA)   . Hypertension   . Hyperlipidemia   . Peripheral edema   . Palpitations   . Restless legs   . Fatigue   . GERD (gastroesophageal reflux disease)   . Persistent disorder of initiating or maintaining sleep   . Dermatitis   . H/O: hysterectomy   . S/P inguinal herniorrhaphy   . H/O: knee surgery   . Atrial fibrillation   . Stroke     TIA- series that have resolved- 2000  . Shortness of breath   . DJD (degenerative joint disease) of knee 09/18/2011    knees- both, elbow- R, ankles    Past Surgical History  Procedure Date  . Abdominal hysterectomy   . Knee arthroscopy     2 on left and 1 on right  . Foot surgery     left foot stress fracture repair  . Cardiac catheterization     /w stents - 2005  . Hernia repair     1989- esophageal hernia     reports that she has never smoked. She has never used smokeless tobacco. She reports that she does not drink alcohol or use illicit drugs. family history includes Arthritis in an unspecified family member; Cancer in her brothers; Coronary artery disease in her mother; Heart disease in her mother; Hypertension in an unspecified family member; Other in an unspecified family member; and Stroke in her brother and father.  There is no history of Breast cancer, and Colon cancer, and Anesthesia problems, and Hypotension, and  Malignant hyperthermia, and Pseudochol deficiency, . No Known Allergies   Review of Systems see hpi     Objective:   Physical Exam  Nursing note and vitals reviewed. Constitutional: She appears well-developed and well-nourished. No distress.  HENT:  Head: Normocephalic and atraumatic.  Eyes: Conjunctivae are normal. No scleral icterus.  Abdominal: Soft. Bowel sounds are normal.       Mild discomfort to palpation left mid and lower quandrant without guarding, rebound or rigidity. +bs  Skin: Skin is warm and dry. She is not diaphoretic.  Psychiatric: She has a normal mood and affect.          Assessment & Plan:

## 2011-10-06 NOTE — Assessment & Plan Note (Signed)
Discussed potential chest ct in future. Recommend reaching conclusion regarding abd pain first.

## 2011-10-06 NOTE — Assessment & Plan Note (Signed)
New dx. Asx. Prescription for glucometer. Begin fsbs qd. Low sugar/carb diet and exercise

## 2011-10-07 DIAGNOSIS — R739 Hyperglycemia, unspecified: Secondary | ICD-10-CM | POA: Insufficient documentation

## 2011-10-07 NOTE — Assessment & Plan Note (Signed)
Incidental finding on chem7. Obtain chem7 and a1c

## 2011-10-07 NOTE — Progress Notes (Signed)
  Subjective:    Patient ID: Heather [name], female    DOB: 1935/04/09, 77 y.o.   MRN: [id number]  HPI Pt presents to clinic for evaluation of abdominal pain. Notes 1.5-2wk h/o left sided abd pain without radiation, nausea or vomitting. Has noted loose stool without blood. No f/c. H/o diverticulosis and underwent EGD last month. Taking ppi bid. No alleviating or exacerbating factors. Knee surgery postponed due to ekg changes while cardiology evaluation performed.   Past Medical History  Diagnosis Date  . Coronary artery disease     post percutaneous coronary intervention in 2006  . Transient ischemic attack (TIA)   . Hypertension   . Hyperlipidemia   . Peripheral edema   . Palpitations   . Restless legs   . Fatigue   . GERD (gastroesophageal reflux disease)   . Persistent disorder of initiating or maintaining sleep   . Dermatitis   . H/O: hysterectomy   . S/P inguinal herniorrhaphy   . H/O: knee surgery   . Atrial fibrillation   . Stroke     TIA- series that have resolved- 2000  . Shortness of breath   . DJD (degenerative joint disease) of knee 09/18/2011    knees- both, elbow- R, ankles    Past Surgical History  Procedure Date  . Abdominal hysterectomy   . Knee arthroscopy     2 on left and 1 on right  . Foot surgery     left foot stress fracture repair  . Cardiac catheterization     /w stents - 2005  . Hernia repair     1989- esophageal hernia     reports that she has never smoked. She has never used smokeless tobacco. She reports that she does not drink alcohol or use illicit drugs. family history includes Arthritis in an unspecified family member; Cancer in her brothers; Coronary artery disease in her mother; Heart disease in her mother; Hypertension in an unspecified family member; Other in an unspecified family member; and Stroke in her brother and father.  There is no history of Breast cancer, and Colon cancer, and Anesthesia problems, and Hypotension, and Malignant  hyperthermia, and Pseudochol deficiency, . No Known Allergies   Review of Systems see hpi     Objective:   Physical Exam  Nursing note and vitals reviewed. Constitutional: She appears well-developed and well-nourished. No distress.  HENT:  Head: Normocephalic and atraumatic.  Right Ear: External ear normal.  Left Ear: External ear normal.  Eyes: Conjunctivae are normal. No scleral icterus.  Abdominal: Soft. Normal appearance and bowel sounds are normal. She exhibits no distension and no mass. There is tenderness. There is no rebound and no guarding.    Skin: She is not diaphoretic.          Assessment & Plan:

## 2011-10-07 NOTE — Assessment & Plan Note (Signed)
Obtain cbc, chem7, lft, amylase, lipase. Schedule abd ct. Begin flagyl/cipro. Close f/u scheduled.

## 2011-10-13 ENCOUNTER — Encounter: Payer: Self-pay | Admitting: Cardiovascular Disease

## 2011-10-13 DIAGNOSIS — R55 Syncope and collapse: Secondary | ICD-10-CM | POA: Insufficient documentation

## 2011-10-13 NOTE — Assessment & Plan Note (Signed)
Stable without angina. As above, with new episodes of near syncope and upcoming surgery we'll proceed with a stress test. Continue same medications.

## 2011-10-13 NOTE — Assessment & Plan Note (Signed)
Well controlled 

## 2011-10-13 NOTE — Assessment & Plan Note (Signed)
The patient's symptom complex sounds typical of neurodepressor syncope (vasovagal). They have occurred after prolonged standing and she has a clear prodrome. With no past history of this, especially in the setting of her known coronary artery disease and upcoming surgery, I have recommended a dobutamine stress echocardiogram to rule out significant myocardial ischemia. If this study is normal she can proceed with surgery without further testing. We went over the importance of adequate fluid hydration, frequent meals, and avoidance of prolonged standing. She understands this.

## 2011-10-13 NOTE — Progress Notes (Signed)
HPI:  This is a 77 year old woman presenting for followup evaluation. The patient has a history of multivessel coronary stenting in 2006(left circumflex and right coronary artery). She had been cleared for left knee surgery, but she has developed interval symptoms of lightheadedness and near syncope. She describes about 5 different episodes where she becomes sweaty and "clammy," then feels very weak and lightheaded. She has had to either sit down or lie down and symptoms resolved over several minutes. She denies palpitations, chest pain, or shortness of breath. Her prior anginal equivalent was jaw pain and she has had none of this. She has not had frank syncope. Most of these near syncopal episodes have occurred after she had been on her feet for a long time. She does not drink a lot of water. She admits to diarrhea over the past few weeks, but her symptoms predated this.  Outpatient Encounter Prescriptions as of 10/04/2011  Medication Sig Dispense Refill  . amLODipine (NORVASC) 10 MG tablet Take 10 mg by mouth every morning.       . calcium-vitamin D (OSCAL WITH D) 500-200 MG-UNIT per tablet Take 1 tablet by mouth 2 (two) times daily.      . ciprofloxacin (CIPRO) 500 MG tablet Take 1 tablet (500 mg total) by mouth 2 (two) times daily.  14 tablet  0  . isosorbide mononitrate (IMDUR) 60 MG 24 hr tablet Take 30 mg by mouth every morning.       Marland Kitchen losartan-hydrochlorothiazide (HYZAAR) 100-25 MG per tablet Take 1 tablet by mouth every morning.       . magnesium oxide (MAG-OX) 400 MG tablet Take 400 mg by mouth 2 (two) times daily.       . metoprolol succinate (TOPROL-XL) 100 MG 24 hr tablet Take 100 mg by mouth 2 (two) times daily. Take with or immediately following a meal.      . metroNIDAZOLE (FLAGYL) 500 MG tablet Take 1 tablet (500 mg total) by mouth 3 (three) times daily.  21 tablet  0  . omeprazole (PRILOSEC) 40 MG capsule Take 40 mg by mouth 2 (two) times daily.       . potassium chloride SA  (K-DUR,KLOR-CON) 20 MEQ tablet Take 20 mEq by mouth daily.        . pramipexole (MIRAPEX) 0.25 MG tablet Take 0.25 mg by mouth 2 (two) times daily.       . simvastatin (ZOCOR) 20 MG tablet Take 1 tablet (20 mg total) by mouth at bedtime.  30 tablet  11    No Known Allergies  Past Medical History  Diagnosis Date  . Coronary artery disease     post percutaneous coronary intervention in 2006  . Transient ischemic attack (TIA)   . Hypertension   . Hyperlipidemia   . Peripheral edema   . Palpitations   . Restless legs   . Fatigue   . GERD (gastroesophageal reflux disease)   . Persistent disorder of initiating or maintaining sleep   . Dermatitis   . H/O: hysterectomy   . S/P inguinal herniorrhaphy   . H/O: knee surgery   . Atrial fibrillation   . Stroke     TIA- series that have resolved- 2000  . Shortness of breath   . DJD (degenerative joint disease) of knee 09/18/2011    knees- both, elbow- R, ankles     ROS: Negative except as per HPI  BP 132/56  Pulse 70  Ht 5\' 3"  (1.6 m)  Wt 77.474 kg (  170 lb 12.8 oz)  BMI 30.26 kg/m2  PHYSICAL EXAM: Pt is alert and oriented, NAD HEENT: normal Neck: JVP - normal, carotids 2+= without bruits Lungs: CTA bilaterally CV: Bradycardic and regular without murmur or gallop Abd: soft, NT, Positive BS, no hepatomegaly Ext: no C/C/E, distal pulses intact and equal Skin: warm/dry no rash  EKG:  EKG gated 09/24/2011 show sinus bradycardia 57 beats per minute, otherwise within normal limits  ASSESSMENT AND PLAN:

## 2011-10-24 ENCOUNTER — Encounter: Payer: Self-pay | Admitting: Internal Medicine

## 2011-10-24 ENCOUNTER — Ambulatory Visit (INDEPENDENT_AMBULATORY_CARE_PROVIDER_SITE_OTHER): Payer: Medicare Other | Admitting: Internal Medicine

## 2011-10-24 ENCOUNTER — Other Ambulatory Visit (HOSPITAL_COMMUNITY): Payer: Self-pay | Admitting: Cardiology

## 2011-10-24 ENCOUNTER — Encounter (HOSPITAL_COMMUNITY): Payer: Self-pay | Admitting: Cardiology

## 2011-10-24 ENCOUNTER — Telehealth: Payer: Self-pay | Admitting: Internal Medicine

## 2011-10-24 ENCOUNTER — Ambulatory Visit (HOSPITAL_COMMUNITY): Payer: Medicare Other | Attending: Cardiovascular Disease

## 2011-10-24 VITALS — BP 120/62 | HR 79 | Temp 97.9°F | Ht 62.0 in | Wt 169.0 lb

## 2011-10-24 DIAGNOSIS — R55 Syncope and collapse: Secondary | ICD-10-CM | POA: Diagnosis not present

## 2011-10-24 DIAGNOSIS — R5381 Other malaise: Secondary | ICD-10-CM | POA: Diagnosis not present

## 2011-10-24 DIAGNOSIS — Z8673 Personal history of transient ischemic attack (TIA), and cerebral infarction without residual deficits: Secondary | ICD-10-CM | POA: Insufficient documentation

## 2011-10-24 DIAGNOSIS — R002 Palpitations: Secondary | ICD-10-CM | POA: Insufficient documentation

## 2011-10-24 DIAGNOSIS — Z01818 Encounter for other preprocedural examination: Secondary | ICD-10-CM

## 2011-10-24 DIAGNOSIS — I4891 Unspecified atrial fibrillation: Secondary | ICD-10-CM | POA: Diagnosis not present

## 2011-10-24 DIAGNOSIS — R609 Edema, unspecified: Secondary | ICD-10-CM | POA: Diagnosis not present

## 2011-10-24 DIAGNOSIS — I10 Essential (primary) hypertension: Secondary | ICD-10-CM | POA: Insufficient documentation

## 2011-10-24 DIAGNOSIS — I2510 Atherosclerotic heart disease of native coronary artery without angina pectoris: Secondary | ICD-10-CM | POA: Diagnosis not present

## 2011-10-24 DIAGNOSIS — E785 Hyperlipidemia, unspecified: Secondary | ICD-10-CM | POA: Diagnosis not present

## 2011-10-24 DIAGNOSIS — E119 Type 2 diabetes mellitus without complications: Secondary | ICD-10-CM

## 2011-10-24 MED ORDER — SODIUM CHLORIDE 0.9 % IV SOLN
20.0000 ug/kg | Freq: Once | INTRAVENOUS | Status: AC
  Administered 2011-10-24: 20 ug/kg/min via INTRAVENOUS

## 2011-10-24 NOTE — Telephone Encounter (Signed)
Lab orders entered for July 2013. 

## 2011-10-24 NOTE — Patient Instructions (Signed)
Please schedule chem7, a1c, urine microalbumin 250.0 and lipid/lft 272.4 prior to next visit 

## 2011-10-28 NOTE — Assessment & Plan Note (Signed)
Average control without medication. Recommend low sugar/carb diet and exercise as tolerated by knee pain. Obtain chem7 and a1c prior to next visit

## 2011-10-28 NOTE — Assessment & Plan Note (Signed)
If bp remains well controlled consider dc of diuretic component due to nocturia

## 2011-10-28 NOTE — Progress Notes (Signed)
  Subjective:    Patient ID: Heather [name], female    DOB: [birth year], 77 y.o.   MRN: [id number]  HPI Pt presents to clinic for follow up of DM. Recent dx of DM and reviewed fsbs log with range of 120-149. Not currently taking medication for the problem. No polyuria or polydipsia but has chronic nocturia 3-4x/night. Does take a diuretic. Scheduled for stress test today as part of preop evaluation.  Past Medical History  Diagnosis Date  . Coronary artery disease     post percutaneous coronary intervention in 2006  . Transient ischemic attack (TIA)   . Hypertension   . Hyperlipidemia   . Peripheral edema   . Palpitations   . Restless legs   . Fatigue   . GERD (gastroesophageal reflux disease)   . Persistent disorder of initiating or maintaining sleep   . Dermatitis   . H/O: hysterectomy   . S/P inguinal herniorrhaphy   . H/O: knee surgery   . Atrial fibrillation   . Stroke     TIA- series that have resolved- 2000  . Shortness of breath   . DJD (degenerative joint disease) of knee 09/18/2011    knees- both, elbow- R, ankles    Past Surgical History  Procedure Date  . Abdominal hysterectomy   . Knee arthroscopy     2 on left and 1 on right  . Foot surgery     left foot stress fracture repair  . Cardiac catheterization     /w stents - 2005  . Hernia repair     1989- esophageal hernia     reports that she has never smoked. She has never used smokeless tobacco. She reports that she does not drink alcohol or use illicit drugs. family history includes Arthritis in an unspecified family member; Cancer in her brothers; Coronary artery disease in her mother; Heart disease in her mother; Hypertension in an unspecified family member; Other in an unspecified family member; and Stroke in her brother and father.  There is no history of Breast cancer, and Colon cancer, and Anesthesia problems, and Hypotension, and Malignant hyperthermia, and Pseudochol deficiency, . No Known  Allergies   Review of Systems see hpi     Objective:   Physical Exam  Nursing note and vitals reviewed. Constitutional: She appears well-developed and well-nourished. No distress.  HENT:  Head: Normocephalic and atraumatic.  Eyes: Conjunctivae are normal. No scleral icterus.  Neurological: She is alert.  Skin: She is not diaphoretic.  Psychiatric: She has a normal mood and affect.          Assessment & Plan:

## 2011-10-29 ENCOUNTER — Encounter: Payer: Self-pay | Admitting: Cardiovascular Disease

## 2011-10-29 ENCOUNTER — Encounter (HOSPITAL_COMMUNITY): Payer: Self-pay | Admitting: Respiratory Therapy

## 2011-10-29 ENCOUNTER — Ambulatory Visit: Payer: BLUE CROSS/BLUE SHIELD | Admitting: Cardiovascular Disease

## 2011-10-29 NOTE — Telephone Encounter (Signed)
This encounter was created in error - please disregard.

## 2011-10-29 NOTE — Telephone Encounter (Signed)
Pt rtn your call pls call cell number

## 2011-10-30 ENCOUNTER — Other Ambulatory Visit: Payer: Self-pay | Admitting: Cardiovascular Disease

## 2011-10-30 ENCOUNTER — Other Ambulatory Visit: Payer: Self-pay | Admitting: Physician Assistant

## 2011-10-30 NOTE — H&P (Signed)
Heather [name] is an 77 y.o. female.   Chief Complaint: left knee DJD HPI: Heather [name] is a 76 year-old female who comes in today with a history of left knee pain.  She has failed intraarticular Cortisone injections, intraarticular Supartz injections and debriding arthroscopy.  She continues to have pain at rest and pain with ambulation.  Difficulty getting in and out of a chair.    Past Medical History  Diagnosis Date  . Coronary artery disease     post percutaneous coronary intervention in 2006  . Transient ischemic attack (TIA)   . Hypertension   . Hyperlipidemia   . Peripheral edema   . Palpitations   . Restless legs   . Fatigue   . GERD (gastroesophageal reflux disease)   . Persistent disorder of initiating or maintaining sleep   . Dermatitis   . H/O: hysterectomy   . S/P inguinal herniorrhaphy   . H/O: knee surgery   . Atrial fibrillation   . Stroke     TIA- series that have resolved- 2000  . Shortness of breath   . DJD (degenerative joint disease) of knee 09/18/2011    knees- both, elbow- R, ankles     Past Surgical History  Procedure Date  . Abdominal hysterectomy   . Knee arthroscopy     2 on left and 1 on right  . Foot surgery     left foot stress fracture repair  . Cardiac catheterization     /w stents - 2005  . Hernia repair     1989- esophageal hernia     Family History  Problem Relation Age of Onset  . Arthritis      family hx of  . Hypertension      family hx of  . Other      family hx of cardiovascular disorder  . Stroke Father     family hx of M 1st degree relative <50  . Coronary artery disease Mother   . Heart disease Mother   . Cancer Brother     bladder  . Breast cancer Neg Hx   . Colon cancer Neg Hx   . Anesthesia problems Neg Hx   . Hypotension Neg Hx   . Malignant hyperthermia Neg Hx   . Pseudochol deficiency Neg Hx   . Stroke Brother   . Cancer Brother    Social History:  reports that she has never smoked. She has never used smokeless  tobacco. She reports that she does not drink alcohol or use illicit drugs.  Allergies: No Known Allergies   (Not in a hospital admission)  No results found for this or any previous visit (from the past 48 hour(s)). No results found.  Review of Systems  Constitutional: Negative.   HENT: Negative.   Eyes: Negative.   Respiratory: Negative.   Cardiovascular: Negative.   Gastrointestinal: Negative.   Genitourinary: Negative.   Musculoskeletal: Positive for joint pain.       Left knee  Skin: Negative.   Neurological: Negative.   Endo/Heme/Allergies: Negative.   Psychiatric/Behavioral: Negative.     Blood pressure 151/72, pulse 64, temperature 98.3 F (36.8 C), height 5' 3" (1.6 m), weight 76.658 kg (169 lb), SpO2 97.00%. Physical Exam  Constitutional: She is oriented to person, place, and time. She appears well-developed and well-nourished.  HENT:  Head: Normocephalic and atraumatic.  Mouth/Throat: Oropharynx is clear and moist.  Eyes: Conjunctivae and EOM are normal. Pupils are equal, round, and reactive to light.  Neck:   Normal range of motion. Neck supple.  Cardiovascular: Normal rate and regular rhythm.   Respiratory: Effort normal.  GI: Soft.  Genitourinary:       Not pertinent to current symptomatology therefore not examined.  Musculoskeletal:       She is independently ambulatory with a moderately antalgic gait.  She has active range of motion 0-125 degrees bilaterally.  2+ crepitus bilaterally.  1+ synovitis bilaterally.  Medial joint line tenderness bilaterally with the right being more painful than the left.    Neurological: She is alert and oriented to person, place, and time.  Skin: Skin is warm and dry.  Psychiatric: She has a normal mood and affect. Her behavior is normal. Judgment and thought content normal.     Assessment Patient Active Problem List  Diagnoses  . HYPERLIPIDEMIA  . PERSISTENT DISORDER INITIATING/MAINTAINING SLEEP  . RESTLESS LEGS SYNDROME    . HYPERTENSION  . CAD  . CORONARY ATHEROSCLEROSIS NATIVE CORONARY ARTERY  . GERD  . DERMATITIS  . FATIGUE  . PERIPHERAL EDEMA  . PALPITATIONS  . TRANSIENT ISCHEMIC ATTACK, HX OF  . Chronic knee pain  . Rhinosinusitis  . Renal insufficiency  . DJD (degenerative joint disease) of knee  . DM (diabetes mellitus)  . Pulmonary nodule  . Abdominal  pain, other specified site  . Hyperglycemia  . Pre-syncope   X-RAYS: Standing AP, lateral, flexion and sunrise views shows significant degenerative joint disease with joint space narrowing.  Periarticular spurring and subchondral sclerosis.     Plan At this point in time risks, benefits and possible complications of surgery have been discussed in detail with the patient.  She has been medically cleared for surgery by Dr. Michael Cooper and Dr. Thomas Hodgins.  Postoperatively she will be going to [facility] for rehab.    Dilynn Munroe J 10/30/2011, 4:47 PM    

## 2011-10-31 ENCOUNTER — Telehealth: Payer: Self-pay | Admitting: Internal Medicine

## 2011-10-31 DIAGNOSIS — M1710 Unilateral primary osteoarthritis, unspecified knee: Secondary | ICD-10-CM | POA: Diagnosis not present

## 2011-10-31 MED ORDER — PRAMIPEXOLE DIHYDROCHLORIDE 0.25 MG PO TABS: 0.2500 mg | ORAL_TABLET | Freq: Two times a day (BID) | ORAL | Status: DC

## 2011-10-31 NOTE — Telephone Encounter (Signed)
Patient states that she is using a new pharmacy  [facility] on [address]    She also would like a refill of pramipexole 0.25mg . 2 tablets by mouth daily.

## 2011-10-31 NOTE — Telephone Encounter (Signed)
Rx refill sent to pharmacy. 

## 2011-11-05 NOTE — Pre-Procedure Instructions (Signed)
22 Heather [name]  11/05/2011   Your procedure is scheduled on:  Nov 12, 2011 at 9am  Report to [facility] at 7 AM.  Call this number if you have problems the morning of surgery: [phone]   Remember:   Do not eat food:After Midnight.  May have clear liquids: up to 4 Hours before arrival.  Clear liquids include soda, tea, black coffee, apple or grape juice, broth.  Take these medicines the morning of surgery with A SIP OF WATER: Norvasc, Imdur, Toprol, Prilosec, Mirapex   Do not wear jewelry, make-up or nail polish.  Do not wear lotions, powders, or perfumes.  Do not shave 48 hours prior to surgery.  Do not bring valuables to the hospital.  Contacts, dentures or bridgework may not be worn into surgery.  Leave suitcase in the car. After surgery it may be brought to your room.  For patients admitted to the hospital, checkout time is 11:00 AM the day of discharge.   Patients discharged the day of surgery will not be allowed to drive home.  Special Instructions: CHG Shower Use Special Wash: 1/2 bottle night before surgery and 1/2 bottle morning of surgery.   Please read over the following fact sheets that you were given: Pain Booklet, Coughing and Deep Breathing, Blood Transfusion Information, Total Joint Packet, MRSA Information and Surgical Site Infection Prevention

## 2011-11-06 ENCOUNTER — Encounter (HOSPITAL_COMMUNITY)
Admission: RE | Admit: 2011-11-06 | Discharge: 2011-11-06 | Disposition: A | Discharge status: Home / Self Care | Payer: Medicare Other | Source: Ambulatory Visit | Attending: Orthopedic Surgery | Admitting: Orthopedic Surgery

## 2011-11-06 ENCOUNTER — Encounter (HOSPITAL_COMMUNITY): Payer: Self-pay

## 2011-11-06 HISTORY — DX: Personal history of other specified conditions: Z87.898

## 2011-11-06 HISTORY — DX: Diverticulitis of intestine, part unspecified, without perforation or abscess without bleeding: K57.92

## 2011-11-06 HISTORY — DX: Tremor, unspecified: R25.1

## 2011-11-06 LAB — COMPREHENSIVE METABOLIC PANEL
ALT: 21 U/L (ref 0–35)
AST: 20 U/L (ref 0–37)
Alkaline Phosphatase: 91 U/L (ref 39–117)
CO2: 23 mEq/L (ref 19–32)
Calcium: 9.5 mg/dL (ref 8.4–10.5)
Glucose, Bld: 128 mg/dL — ABNORMAL HIGH (ref 70–99)
Potassium: 3.8 mEq/L (ref 3.5–5.1)
Sodium: 138 mEq/L (ref 135–145)
Total Protein: 7.1 g/dL (ref 6.0–8.3)

## 2011-11-06 LAB — SURGICAL PCR SCREEN
MRSA, PCR: NEGATIVE
Staphylococcus aureus: POSITIVE — AB

## 2011-11-06 LAB — URINALYSIS, ROUTINE W REFLEX MICROSCOPIC
Leukocytes, UA: NEGATIVE
Nitrite: NEGATIVE
Specific Gravity, Urine: 1.007 (ref 1.005–1.030)
Urobilinogen, UA: 0.2 mg/dL (ref 0.0–1.0)
pH: 7.5 (ref 5.0–8.0)

## 2011-11-06 LAB — DIFFERENTIAL
Eosinophils Absolute: 0.3 10*3/uL (ref 0.0–0.7)
Eosinophils Relative: 4 % (ref 0–5)
Lymphocytes Relative: 24 % (ref 12–46)
Lymphs Abs: 1.8 10*3/uL (ref 0.7–4.0)
Monocytes Absolute: 0.7 10*3/uL (ref 0.1–1.0)

## 2011-11-06 LAB — CBC
Hemoglobin: 14 g/dL (ref 12.0–15.0)
MCH: 31.2 pg (ref 26.0–34.0)
MCHC: 34.4 g/dL (ref 30.0–36.0)
Platelets: 162 10*3/uL (ref 150–400)
RBC: 4.49 MIL/uL (ref 3.87–5.11)

## 2011-11-06 LAB — TYPE AND SCREEN: Antibody Screen: NEGATIVE

## 2011-11-06 LAB — APTT: aPTT: 28 seconds (ref 24–37)

## 2011-11-06 NOTE — Progress Notes (Signed)
Primary physician - Dr. Rodena Medin - [facility]  Cardiologist is Dr. Excell Seltzer.  Cath 2006 in epic, stress test in 4/13 in epic, echo 4/13 in epic

## 2011-11-07 LAB — URINE CULTURE: Culture  Setup Time: 201304301122

## 2011-11-11 MED ORDER — POVIDONE-IODINE 7.5 % EX SOLN
Freq: Once | CUTANEOUS | Status: DC
  Filled 2011-11-11: qty 118

## 2011-11-11 MED ORDER — CHLORHEXIDINE GLUCONATE 4 % EX LIQD: 60.0000 mL | Freq: Once | CUTANEOUS | Status: DC

## 2011-11-11 MED ORDER — CEFAZOLIN SODIUM 1-5 GM-% IV SOLN
1.0000 g | INTRAVENOUS | Status: AC
  Administered 2011-11-12: 1 g via INTRAVENOUS
  Filled 2011-11-11: qty 50

## 2011-11-12 ENCOUNTER — Inpatient Hospital Stay (HOSPITAL_COMMUNITY)
Admission: RE | Admit: 2011-11-12 | Discharge: 2011-11-15 | DRG: 470 | Disposition: A | Discharge status: Skilled Nursing Facility | Payer: Medicare Other | Source: Ambulatory Visit | Attending: Orthopedic Surgery | Admitting: Orthopedic Surgery

## 2011-11-12 ENCOUNTER — Encounter (HOSPITAL_COMMUNITY): Payer: Self-pay | Admitting: Anesthesiology

## 2011-11-12 ENCOUNTER — Encounter (HOSPITAL_COMMUNITY)
Admission: RE | Disposition: A | Discharge status: Skilled Nursing Facility | Payer: Self-pay | Source: Ambulatory Visit | Attending: Orthopedic Surgery

## 2011-11-12 ENCOUNTER — Encounter (HOSPITAL_COMMUNITY): Payer: Self-pay | Admitting: *Deleted

## 2011-11-12 ENCOUNTER — Ambulatory Visit (HOSPITAL_COMMUNITY): Payer: Medicare Other | Admitting: Anesthesiology

## 2011-11-12 DIAGNOSIS — IMO0002 Reserved for concepts with insufficient information to code with codable children: Secondary | ICD-10-CM | POA: Diagnosis not present

## 2011-11-12 DIAGNOSIS — K219 Gastro-esophageal reflux disease without esophagitis: Secondary | ICD-10-CM | POA: Diagnosis present

## 2011-11-12 DIAGNOSIS — Z2239 Carrier of other specified bacterial diseases: Secondary | ICD-10-CM | POA: Diagnosis not present

## 2011-11-12 DIAGNOSIS — Z9861 Coronary angioplasty status: Secondary | ICD-10-CM | POA: Diagnosis not present

## 2011-11-12 DIAGNOSIS — E785 Hyperlipidemia, unspecified: Secondary | ICD-10-CM | POA: Diagnosis present

## 2011-11-12 DIAGNOSIS — R509 Fever, unspecified: Secondary | ICD-10-CM | POA: Diagnosis not present

## 2011-11-12 DIAGNOSIS — M1710 Unilateral primary osteoarthritis, unspecified knee: Principal | ICD-10-CM | POA: Diagnosis present

## 2011-11-12 DIAGNOSIS — E119 Type 2 diabetes mellitus without complications: Secondary | ICD-10-CM | POA: Diagnosis present

## 2011-11-12 DIAGNOSIS — I2510 Atherosclerotic heart disease of native coronary artery without angina pectoris: Secondary | ICD-10-CM | POA: Diagnosis present

## 2011-11-12 DIAGNOSIS — I4891 Unspecified atrial fibrillation: Secondary | ICD-10-CM | POA: Diagnosis present

## 2011-11-12 DIAGNOSIS — R609 Edema, unspecified: Secondary | ICD-10-CM | POA: Diagnosis present

## 2011-11-12 DIAGNOSIS — Z5189 Encounter for other specified aftercare: Secondary | ICD-10-CM | POA: Diagnosis not present

## 2011-11-12 DIAGNOSIS — D62 Acute posthemorrhagic anemia: Secondary | ICD-10-CM | POA: Diagnosis not present

## 2011-11-12 DIAGNOSIS — Z7901 Long term (current) use of anticoagulants: Secondary | ICD-10-CM | POA: Diagnosis not present

## 2011-11-12 DIAGNOSIS — J9819 Other pulmonary collapse: Secondary | ICD-10-CM | POA: Diagnosis not present

## 2011-11-12 DIAGNOSIS — Z8673 Personal history of transient ischemic attack (TIA), and cerebral infarction without residual deficits: Secondary | ICD-10-CM

## 2011-11-12 DIAGNOSIS — M1990 Unspecified osteoarthritis, unspecified site: Secondary | ICD-10-CM | POA: Diagnosis not present

## 2011-11-12 DIAGNOSIS — R262 Difficulty in walking, not elsewhere classified: Secondary | ICD-10-CM | POA: Diagnosis not present

## 2011-11-12 DIAGNOSIS — S8990XA Unspecified injury of unspecified lower leg, initial encounter: Secondary | ICD-10-CM | POA: Diagnosis not present

## 2011-11-12 DIAGNOSIS — Z01812 Encounter for preprocedural laboratory examination: Secondary | ICD-10-CM

## 2011-11-12 DIAGNOSIS — G8918 Other acute postprocedural pain: Secondary | ICD-10-CM | POA: Diagnosis not present

## 2011-11-12 DIAGNOSIS — M6281 Muscle weakness (generalized): Secondary | ICD-10-CM | POA: Diagnosis not present

## 2011-11-12 DIAGNOSIS — G2581 Restless legs syndrome: Secondary | ICD-10-CM | POA: Diagnosis not present

## 2011-11-12 DIAGNOSIS — R918 Other nonspecific abnormal finding of lung field: Secondary | ICD-10-CM | POA: Diagnosis not present

## 2011-11-12 DIAGNOSIS — Z22321 Carrier or suspected carrier of Methicillin susceptible Staphylococcus aureus: Secondary | ICD-10-CM

## 2011-11-12 DIAGNOSIS — M7631 Iliotibial band syndrome, right leg: Secondary | ICD-10-CM | POA: Diagnosis present

## 2011-11-12 DIAGNOSIS — I10 Essential (primary) hypertension: Secondary | ICD-10-CM | POA: Diagnosis present

## 2011-11-12 DIAGNOSIS — Z96659 Presence of unspecified artificial knee joint: Secondary | ICD-10-CM | POA: Diagnosis not present

## 2011-11-12 DIAGNOSIS — M25559 Pain in unspecified hip: Secondary | ICD-10-CM | POA: Diagnosis present

## 2011-11-12 DIAGNOSIS — K449 Diaphragmatic hernia without obstruction or gangrene: Secondary | ICD-10-CM | POA: Diagnosis not present

## 2011-11-12 DIAGNOSIS — M25569 Pain in unspecified knee: Secondary | ICD-10-CM | POA: Diagnosis not present

## 2011-11-12 HISTORY — DX: Angina pectoris, unspecified: I20.9

## 2011-11-12 HISTORY — DX: Cardiac arrhythmia, unspecified: I49.9

## 2011-11-12 HISTORY — DX: Personal history of other diseases of the digestive system: Z87.19

## 2011-11-12 HISTORY — PX: TOTAL KNEE ARTHROPLASTY: SHX125

## 2011-11-12 HISTORY — DX: Prediabetes: R73.03

## 2011-11-12 SURGERY — ARTHROPLASTY, KNEE, TOTAL
Anesthesia: General | Site: Knee | Laterality: Left | Wound class: Clean

## 2011-11-12 MED ORDER — PRAMIPEXOLE DIHYDROCHLORIDE 0.25 MG PO TABS
0.2500 mg | ORAL_TABLET | Freq: Two times a day (BID) | ORAL | Status: DC
  Administered 2011-11-12 – 2011-11-14 (×5): 0.25 mg via ORAL
  Filled 2011-11-12 (×8): qty 1

## 2011-11-12 MED ORDER — LACTATED RINGERS IV SOLN: INTRAVENOUS | Status: DC

## 2011-11-12 MED ORDER — HYDROMORPHONE HCL PF 1 MG/ML IJ SOLN
0.5000 mg | INTRAMUSCULAR | Status: DC | PRN
  Administered 2011-11-12 – 2011-11-14 (×7): 1 mg via INTRAVENOUS
  Filled 2011-11-12 (×7): qty 1

## 2011-11-12 MED ORDER — POVIDONE-IODINE 7.5 % EX SOLN: Freq: Once | CUTANEOUS | Status: DC

## 2011-11-12 MED ORDER — METOCLOPRAMIDE HCL 5 MG/ML IJ SOLN: 5.0000 mg | Freq: Three times a day (TID) | INTRAMUSCULAR | Status: DC | PRN

## 2011-11-12 MED ORDER — POTASSIUM CHLORIDE CRYS ER 20 MEQ PO TBCR
20.0000 meq | EXTENDED_RELEASE_TABLET | Freq: Every day | ORAL | Status: DC
  Administered 2011-11-12 – 2011-11-15 (×4): 20 meq via ORAL
  Filled 2011-11-12 (×5): qty 1

## 2011-11-12 MED ORDER — DOCUSATE SODIUM 100 MG PO CAPS
100.0000 mg | ORAL_CAPSULE | Freq: Two times a day (BID) | ORAL | Status: DC
  Administered 2011-11-12 – 2011-11-15 (×7): 100 mg via ORAL
  Filled 2011-11-12 (×7): qty 1

## 2011-11-12 MED ORDER — LOSARTAN POTASSIUM-HCTZ 100-25 MG PO TABS: 1.0000 | ORAL_TABLET | ORAL | Status: DC

## 2011-11-12 MED ORDER — CALCIUM CARBONATE-VITAMIN D 500-200 MG-UNIT PO TABS
1.0000 | ORAL_TABLET | Freq: Two times a day (BID) | ORAL | Status: DC
  Administered 2011-11-12 – 2011-11-15 (×7): 1 via ORAL
  Filled 2011-11-12 (×9): qty 1

## 2011-11-12 MED ORDER — EPHEDRINE SULFATE 50 MG/ML IJ SOLN
INTRAMUSCULAR | Status: DC | PRN
  Administered 2011-11-12: 10 mg via INTRAVENOUS

## 2011-11-12 MED ORDER — CEFUROXIME SODIUM 1.5 G IJ SOLR
INTRAMUSCULAR | Status: DC | PRN
  Administered 2011-11-12: 1.5 g

## 2011-11-12 MED ORDER — CEFAZOLIN SODIUM 1-5 GM-% IV SOLN: 1.0000 g | INTRAVENOUS | Status: DC

## 2011-11-12 MED ORDER — MORPHINE SULFATE 4 MG/ML IJ SOLN: 0.0500 mg/kg | INTRAMUSCULAR | Status: DC | PRN

## 2011-11-12 MED ORDER — AMLODIPINE BESYLATE 10 MG PO TABS
10.0000 mg | ORAL_TABLET | Freq: Every morning | ORAL | Status: DC
  Administered 2011-11-14 – 2011-11-15 (×2): 10 mg via ORAL
  Filled 2011-11-12 (×4): qty 1

## 2011-11-12 MED ORDER — MAGNESIUM OXIDE 400 (241.3 MG) MG PO TABS
400.0000 mg | ORAL_TABLET | Freq: Two times a day (BID) | ORAL | Status: DC
  Administered 2011-11-12 – 2011-11-15 (×7): 400 mg via ORAL
  Filled 2011-11-12 (×9): qty 1

## 2011-11-12 MED ORDER — ACETAMINOPHEN 650 MG RE SUPP: 650.0000 mg | Freq: Four times a day (QID) | RECTAL | Status: DC | PRN

## 2011-11-12 MED ORDER — CHLORHEXIDINE GLUCONATE 4 % EX LIQD: 60.0000 mL | Freq: Once | CUTANEOUS | Status: DC

## 2011-11-12 MED ORDER — OXYCODONE-ACETAMINOPHEN 5-325 MG PO TABS
1.0000 | ORAL_TABLET | ORAL | Status: DC | PRN
  Administered 2011-11-12: 1 via ORAL
  Administered 2011-11-13 – 2011-11-15 (×8): 2 via ORAL
  Filled 2011-11-12 (×2): qty 2
  Filled 2011-11-12: qty 1
  Filled 2011-11-12 (×6): qty 2

## 2011-11-12 MED ORDER — MUPIROCIN 2 % EX OINT
TOPICAL_OINTMENT | Freq: Two times a day (BID) | CUTANEOUS | Status: DC
  Administered 2011-11-12 – 2011-11-14 (×3): via NASAL
  Filled 2011-11-12: qty 22

## 2011-11-12 MED ORDER — SIMVASTATIN 20 MG PO TABS
20.0000 mg | ORAL_TABLET | Freq: Every day | ORAL | Status: DC
  Administered 2011-11-12 – 2011-11-14 (×3): 20 mg via ORAL
  Filled 2011-11-12 (×4): qty 1

## 2011-11-12 MED ORDER — METOCLOPRAMIDE HCL 10 MG PO TABS: 5.0000 mg | ORAL_TABLET | Freq: Three times a day (TID) | ORAL | Status: DC | PRN

## 2011-11-12 MED ORDER — PANTOPRAZOLE SODIUM 40 MG PO TBEC
80.0000 mg | DELAYED_RELEASE_TABLET | Freq: Every day | ORAL | Status: DC
  Administered 2011-11-13: 80 mg via ORAL
  Filled 2011-11-12: qty 1

## 2011-11-12 MED ORDER — HYDROCHLOROTHIAZIDE 25 MG PO TABS
25.0000 mg | ORAL_TABLET | Freq: Every day | ORAL | Status: DC
  Administered 2011-11-12 – 2011-11-15 (×3): 25 mg via ORAL
  Filled 2011-11-12 (×4): qty 1

## 2011-11-12 MED ORDER — CEFAZOLIN SODIUM-DEXTROSE 2-3 GM-% IV SOLR
2.0000 g | Freq: Four times a day (QID) | INTRAVENOUS | Status: AC
  Administered 2011-11-12 – 2011-11-13 (×3): 2 g via INTRAVENOUS
  Filled 2011-11-12 (×3): qty 50

## 2011-11-12 MED ORDER — METOPROLOL SUCCINATE ER 100 MG PO TB24
100.0000 mg | ORAL_TABLET | Freq: Two times a day (BID) | ORAL | Status: DC
  Administered 2011-11-12 – 2011-11-15 (×6): 100 mg via ORAL
  Filled 2011-11-12 (×8): qty 1

## 2011-11-12 MED ORDER — ACETAMINOPHEN 10 MG/ML IV SOLN
INTRAVENOUS | Status: DC | PRN
  Administered 2011-11-12: 1000 mg via INTRAVENOUS

## 2011-11-12 MED ORDER — POTASSIUM CHLORIDE IN NACL 20-0.9 MEQ/L-% IV SOLN
INTRAVENOUS | Status: DC
  Administered 2011-11-12: 15:00:00 via INTRAVENOUS
  Filled 2011-11-12 (×9): qty 1000

## 2011-11-12 MED ORDER — ONDANSETRON HCL 4 MG/2ML IJ SOLN
INTRAMUSCULAR | Status: DC | PRN
  Administered 2011-11-12: 4 mg via INTRAVENOUS

## 2011-11-12 MED ORDER — MENTHOL 3 MG MT LOZG: 1.0000 | LOZENGE | OROMUCOSAL | Status: DC | PRN

## 2011-11-12 MED ORDER — PROPOFOL 10 MG/ML IV EMUL
INTRAVENOUS | Status: DC | PRN
  Administered 2011-11-12: 200 mg via INTRAVENOUS

## 2011-11-12 MED ORDER — ISOSORBIDE MONONITRATE ER 60 MG PO TB24
60.0000 mg | ORAL_TABLET | Freq: Every day | ORAL | Status: DC
  Administered 2011-11-14 – 2011-11-15 (×2): 60 mg via ORAL
  Filled 2011-11-12 (×4): qty 1

## 2011-11-12 MED ORDER — LIDOCAINE HCL (CARDIAC) 20 MG/ML IV SOLN
INTRAVENOUS | Status: DC | PRN
  Administered 2011-11-12: 60 mg via INTRAVENOUS

## 2011-11-12 MED ORDER — PHENOL 1.4 % MT LIQD: 1.0000 | OROMUCOSAL | Status: DC | PRN

## 2011-11-12 MED ORDER — MIDAZOLAM HCL 5 MG/5ML IJ SOLN
INTRAMUSCULAR | Status: DC | PRN
  Administered 2011-11-12 (×2): 1 mg via INTRAVENOUS

## 2011-11-12 MED ORDER — BISACODYL 5 MG PO TBEC
10.0000 mg | DELAYED_RELEASE_TABLET | Freq: Every day | ORAL | Status: DC
  Administered 2011-11-12 – 2011-11-13 (×2): 10 mg via ORAL
  Filled 2011-11-12 (×2): qty 2

## 2011-11-12 MED ORDER — LACTATED RINGERS IV SOLN
INTRAVENOUS | Status: DC | PRN
  Administered 2011-11-12 (×2): via INTRAVENOUS

## 2011-11-12 MED ORDER — LOSARTAN POTASSIUM 50 MG PO TABS
100.0000 mg | ORAL_TABLET | Freq: Every day | ORAL | Status: DC
  Administered 2011-11-12 – 2011-11-15 (×3): 100 mg via ORAL
  Filled 2011-11-12 (×4): qty 2

## 2011-11-12 MED ORDER — HYDROMORPHONE HCL PF 1 MG/ML IJ SOLN
0.2500 mg | INTRAMUSCULAR | Status: DC | PRN
  Administered 2011-11-12: 0.25 mg via INTRAVENOUS
  Administered 2011-11-12: 0.5 mg via INTRAVENOUS

## 2011-11-12 MED ORDER — CHLORHEXIDINE GLUCONATE CLOTH 2 % EX PADS: 6.0000 | MEDICATED_PAD | Freq: Every day | CUTANEOUS | Status: DC

## 2011-11-12 MED ORDER — ONDANSETRON HCL 4 MG PO TABS: 4.0000 mg | ORAL_TABLET | Freq: Four times a day (QID) | ORAL | Status: DC | PRN

## 2011-11-12 MED ORDER — ACETAMINOPHEN 10 MG/ML IV SOLN
INTRAVENOUS | Status: AC
  Filled 2011-11-12: qty 100

## 2011-11-12 MED ORDER — FENTANYL CITRATE 0.05 MG/ML IJ SOLN
INTRAMUSCULAR | Status: DC | PRN
  Administered 2011-11-12: 50 ug via INTRAVENOUS
  Administered 2011-11-12 (×4): 25 ug via INTRAVENOUS

## 2011-11-12 MED ORDER — ENOXAPARIN SODIUM 30 MG/0.3ML ~~LOC~~ SOLN
30.0000 mg | Freq: Two times a day (BID) | SUBCUTANEOUS | Status: DC
  Administered 2011-11-13 – 2011-11-15 (×5): 30 mg via SUBCUTANEOUS
  Filled 2011-11-12 (×7): qty 0.3

## 2011-11-12 MED ORDER — SODIUM CHLORIDE 0.9 % IR SOLN
Status: DC | PRN
  Administered 2011-11-12: 3000 mL

## 2011-11-12 MED ORDER — ONDANSETRON HCL 4 MG/2ML IJ SOLN: 4.0000 mg | Freq: Four times a day (QID) | INTRAMUSCULAR | Status: DC | PRN

## 2011-11-12 MED ORDER — OXYCODONE HCL 5 MG PO TABS
5.0000 mg | ORAL_TABLET | ORAL | Status: DC | PRN
  Administered 2011-11-14: 5 mg via ORAL
  Administered 2011-11-14: 10 mg via ORAL
  Filled 2011-11-12 (×2): qty 2

## 2011-11-12 MED ORDER — ACETAMINOPHEN 325 MG PO TABS
650.0000 mg | ORAL_TABLET | Freq: Four times a day (QID) | ORAL | Status: DC | PRN
  Administered 2011-11-14: 650 mg via ORAL
  Filled 2011-11-12: qty 2

## 2011-11-12 SURGICAL SUPPLY — 68 items
BANDAGE ESMARK 6X9 LF (GAUZE/BANDAGES/DRESSINGS) ×1 IMPLANT
BLADE SAGITTAL 25.0X1.19X90 (BLADE) ×2 IMPLANT
BLADE SAW SGTL 11.0X1.19X90.0M (BLADE) IMPLANT
BLADE SAW SGTL 13.0X1.19X90.0M (BLADE) ×2 IMPLANT
BLADE SURG 10 STRL SS (BLADE) ×4 IMPLANT
BNDG CMPR 9X6 STRL LF SNTH (GAUZE/BANDAGES/DRESSINGS) ×1
BNDG CMPR MED 15X6 ELC VLCR LF (GAUZE/BANDAGES/DRESSINGS) ×1
BNDG ELASTIC 6X15 VLCR STRL LF (GAUZE/BANDAGES/DRESSINGS) ×2 IMPLANT
BNDG ESMARK 6X9 LF (GAUZE/BANDAGES/DRESSINGS) ×2
BOWL SMART MIX CTS (DISPOSABLE) ×2 IMPLANT
CEMENT HV SMART SET (Cement) ×4 IMPLANT
CLOTH BEACON ORANGE TIMEOUT ST (SAFETY) ×2 IMPLANT
COVER BACK TABLE 24X17X13 BIG (DRAPES) IMPLANT
COVER PROBE W GEL 5X96 (DRAPES) ×1 IMPLANT
COVER SURGICAL LIGHT HANDLE (MISCELLANEOUS) ×2 IMPLANT
CUFF TOURNIQUET SINGLE 34IN LL (TOURNIQUET CUFF) ×2 IMPLANT
CUFF TOURNIQUET SINGLE 44IN (TOURNIQUET CUFF) IMPLANT
DRAPE EXTREMITY T 121X128X90 (DRAPE) ×2 IMPLANT
DRAPE INCISE IOBAN 66X45 STRL (DRAPES) ×1 IMPLANT
DRAPE PROXIMA HALF (DRAPES) ×2 IMPLANT
DRAPE U-SHAPE 47X51 STRL (DRAPES) ×2 IMPLANT
DRSG ADAPTIC 3X8 NADH LF (GAUZE/BANDAGES/DRESSINGS) ×2 IMPLANT
DRSG PAD ABDOMINAL 8X10 ST (GAUZE/BANDAGES/DRESSINGS) ×3 IMPLANT
DURAPREP 26ML APPLICATOR (WOUND CARE) ×2 IMPLANT
ELECT CAUTERY BLADE 6.4 (BLADE) ×2 IMPLANT
ELECT REM PT RETURN 9FT ADLT (ELECTROSURGICAL) ×2
ELECTRODE REM PT RTRN 9FT ADLT (ELECTROSURGICAL) ×1 IMPLANT
EVACUATOR 1/8 PVC DRAIN (DRAIN) ×1 IMPLANT
FACESHIELD LNG OPTICON STERILE (SAFETY) ×2 IMPLANT
GLOVE BIO SURGEON STRL SZ7 (GLOVE) ×2 IMPLANT
GLOVE BIOGEL PI IND STRL 7.0 (GLOVE) ×1 IMPLANT
GLOVE BIOGEL PI IND STRL 7.5 (GLOVE) ×1 IMPLANT
GLOVE BIOGEL PI INDICATOR 7.0 (GLOVE) ×1
GLOVE BIOGEL PI INDICATOR 7.5 (GLOVE) ×3
GLOVE SS BIOGEL STRL SZ 7.5 (GLOVE) ×1 IMPLANT
GLOVE SUPERSENSE BIOGEL SZ 7.5 (GLOVE) ×1
GOWN PREVENTION PLUS XLARGE (GOWN DISPOSABLE) ×5 IMPLANT
GOWN STRL NON-REIN LRG LVL3 (GOWN DISPOSABLE) ×4 IMPLANT
HANDPIECE INTERPULSE COAX TIP (DISPOSABLE) ×2
HOOD PEEL AWAY FACE SHEILD DIS (HOOD) ×5 IMPLANT
IMMOBILIZER KNEE 22 UNIV (SOFTGOODS) IMPLANT
INSERT CUSHION PRONEVIEW LG (MISCELLANEOUS) ×2 IMPLANT
KIT BASIN OR (CUSTOM PROCEDURE TRAY) ×2 IMPLANT
KIT ROOM TURNOVER OR (KITS) ×2 IMPLANT
MANIFOLD NEPTUNE II (INSTRUMENTS) ×2 IMPLANT
NS IRRIG 1000ML POUR BTL (IV SOLUTION) ×2 IMPLANT
PACK TOTAL JOINT (CUSTOM PROCEDURE TRAY) ×2 IMPLANT
PAD ARMBOARD 7.5X6 YLW CONV (MISCELLANEOUS) ×3 IMPLANT
PAD CAST 4YDX4 CTTN HI CHSV (CAST SUPPLIES) ×1 IMPLANT
PADDING CAST COTTON 4X4 STRL (CAST SUPPLIES) ×2
PADDING CAST COTTON 6X4 STRL (CAST SUPPLIES) ×2 IMPLANT
POSITIONER HEAD PRONE TRACH (MISCELLANEOUS) ×2 IMPLANT
RUBBERBAND STERILE (MISCELLANEOUS) ×2 IMPLANT
SET HNDPC FAN SPRY TIP SCT (DISPOSABLE) ×1 IMPLANT
SPONGE GAUZE 4X4 12PLY (GAUZE/BANDAGES/DRESSINGS) ×2 IMPLANT
STRIP CLOSURE SKIN 1/2X4 (GAUZE/BANDAGES/DRESSINGS) ×2 IMPLANT
SUCTION FRAZIER TIP 10 FR DISP (SUCTIONS) ×2 IMPLANT
SUT MNCRL AB 3-0 PS2 18 (SUTURE) ×2 IMPLANT
SUT VIC AB 0 CT1 27 (SUTURE) ×4
SUT VIC AB 0 CT1 27XBRD ANBCTR (SUTURE) ×2 IMPLANT
SUT VIC AB 2-0 CT1 27 (SUTURE) ×4
SUT VIC AB 2-0 CT1 TAPERPNT 27 (SUTURE) ×2 IMPLANT
SUT VLOC 180 0 24IN GS25 (SUTURE) ×2 IMPLANT
SYR 30ML SLIP (SYRINGE) ×1 IMPLANT
TOWEL OR 17X24 6PK STRL BLUE (TOWEL DISPOSABLE) ×2 IMPLANT
TOWEL OR 17X26 10 PK STRL BLUE (TOWEL DISPOSABLE) ×2 IMPLANT
TRAY FOLEY CATH 14FR (SET/KITS/TRAYS/PACK) ×2 IMPLANT
WATER STERILE IRR 1000ML POUR (IV SOLUTION) ×4 IMPLANT

## 2011-11-12 NOTE — Anesthesia Preprocedure Evaluation (Addendum)
Anesthesia Evaluation  Patient identified by MRN, date of birth, ID band  Reviewed: Allergy & Precautions, H&P , NPO status , Patient's Chart, lab work & pertinent test results  Airway Mallampati: II      Dental   Pulmonary neg pulmonary ROS,  breath sounds clear to auscultation        Cardiovascular hypertension, Pt. on medications + CAD + dysrhythmias Rate:Normal     Neuro/Psych TIA   GI/Hepatic Neg liver ROS, PUD, GERD-  ,  Endo/Other  Diabetes mellitus-  Renal/GU negative Renal ROS     Musculoskeletal   Abdominal   Peds  Hematology negative hematology ROS (+)   Anesthesia Other Findings   Reproductive/Obstetrics                           Anesthesia Physical Anesthesia Plan  ASA: III  Anesthesia Plan: General   Post-op Pain Management:    Induction: Intravenous  Airway Management Planned:   Additional Equipment:   Intra-op Plan:   Post-operative Plan: Extubation in OR  Informed Consent: I have reviewed the patients History and Physical, chart, labs and discussed the procedure including the risks, benefits and alternatives for the proposed anesthesia with the patient or authorized representative who has indicated his/her understanding and acceptance.     Plan Discussed with: CRNA  Anesthesia Plan Comments:        Anesthesia Quick Evaluation

## 2011-11-12 NOTE — Plan of Care (Signed)
Problem: Consults Goal: Diagnosis- Total Joint Replacement Outcome: Completed/Met Date Met:  11/12/11 Primary Total Knee Left

## 2011-11-12 NOTE — Anesthesia Procedure Notes (Addendum)
Procedure Name: LMA Insertion Date/Time: 11/12/2011 9:10 AM Performed by: Sherie Don Pre-anesthesia Checklist: Patient identified, Emergency Drugs available, Suction available, Patient being monitored and Timeout performed Patient Re-evaluated:Patient Re-evaluated prior to inductionOxygen Delivery Method: Circle system utilized Preoxygenation: Pre-oxygenation with 100% oxygen Intubation Type: IV induction and Inhalational induction Ventilation: Mask ventilation without difficulty LMA: LMA inserted and LMA with gastric port inserted LMA Size: 4.0 Number of attempts: 1 Tube secured with: Tape Dental Injury: Teeth and Oropharynx as per pre-operative assessment    Anesthesia Regional Block:  Femoral nerve block  Pre-Anesthetic Checklist: ,, timeout performed, Correct Patient, Correct Site, Correct Laterality, Correct Procedure, Correct Position, site marked, Risks and benefits discussed,  Surgical consent,  Pre-op evaluation,  At surgeon's request and post-op pain management  Laterality: Left  Prep: chloraprep       Needles:  Injection technique: Single-shot  Needle Type: Echogenic Stimulator Needle          Additional Needles:  Procedures: Doppler guided and nerve stimulator Femoral nerve block  Nerve Stimulator or Paresthesia:  Response: 0.5 mA,   Additional Responses:   Narrative:  Start time: 11/12/2011 8:10 AM End time: 11/12/2011 8:25 AM  Performed by: Personally  Anesthesiologist: Dr. Randa Evens  Femoral nerve block

## 2011-11-12 NOTE — Interval H&P Note (Signed)
History and Physical Interval Note:  11/12/2011 8:57 AM  Heather [name] Heather [name]  has presented today for surgery, with the diagnosis of DJD LEFT KNEE  The various methods of treatment have been discussed with the patient and family. After consideration of risks, benefits and other options for treatment, the patient has consented to  Procedure(s) (LRB): TOTAL KNEE ARTHROPLASTY (Left) as a surgical intervention .  The patients' history has been reviewed, patient examined, no change in status, stable for surgery.  I have reviewed the patients' chart and labs.  Questions were answered to the patient's satisfaction.     Salvatore Marvel A

## 2011-11-12 NOTE — Preoperative (Signed)
Beta Blockers   Reason not to administer Beta Blockers:toprol xl taken 11/12/11

## 2011-11-12 NOTE — Op Note (Signed)
MRN:     [id number] DOB/AGE:    75-07-1934 / 75 y.o.       OPERATIVE REPORT    DATE OF PROCEDURE:  11/12/2011       PREOPERATIVE DIAGNOSIS:   DJD LEFT KNEE      Estimated Body mass index is 29.87 kg/(m^2) as calculated from the following:   Height as of 09/24/11: 5\' 3" (1.6 m).   Weight as of 09/24/11: 168 lb 9.6 oz(76.476 kg).                                                        POSTOPERATIVE DIAGNOSIS:   DJD LEFT KNEE                                                                      PROCEDURE:  Procedure(s): TOTAL KNEE ARTHROPLASTY Using Depuy Sigma RP implants #2.5 Femur, #2.5Tibia, 10mm sigma RP bearing, 32 Patella Zinecef Impregnated Cement     SURGEON: Marvell Stavola A    ASSISTANT:  Kirstin Shepperson PA-C   (Present and scrubbed throughout the case, critical for assistance with exposure, retraction, instrumentation, and closure.)         ANESTHESIA: GET with Femoral Nerve Block  DRAINS: foley, 2 medium hemovac in knee   TOURNIQUET TIME:   COMPLICATIONS:  None     SPECIMENS: None   INDICATIONS FOR PROCEDURE: The patient has  DJD LEFT KNEE, varus deformities, XR shows bone on bone arthritis. Patient has failed all conservative measures including anti-inflammatory medicines, narcotics, attempts at  exercise and weight loss, cortisone injections and viscosupplementation.  Risks and benefits of surgery have been discussed, questions answered.   DESCRIPTION OF PROCEDURE: The patient identified by armband, received  right femoral nerve block and IV antibiotics, in the holding area at [facility]. Patient taken to the operating room, appropriate anesthetic  monitors were attached General endotracheal anesthesia induced with  the patient in supine position, Foley catheter was inserted. Tourniquet  applied high to the operative thigh. Lateral post and foot positioner  applied to the table, the lower extremity was then prepped and draped  in usual sterile fashion from  the ankle to the tourniquet. Time-out procedure was performed. The limb was wrapped with an Esmarch bandage and the tourniquet inflated to 365 mmHg. We began the operation by making the anterior midline incision starting at handbreadth above the patella going over the patella 1 cm medial to and  4 cm distal to the tibial tubercle. Small bleeders in the skin and the  subcutaneous tissue identified and cauterized. Transverse retinaculum was incised and reflected medially and a medial parapatellar arthrotomy was accomplished. the patella was everted and theprepatellar fat pad resected. The superficial medial collateral  ligament was then elevated from anterior to posterior along the proximal  flare of the tibia and anterior half of the menisci resected. The knee was hyperflexed exposing bone on bone arthritis. Peripheral and notch osteophytes as well as the cruciate ligaments were then resected. We continued to  work our way around posteriorly along the proximal tibia, and  externally  rotated the tibia subluxing it out from underneath the femur. A McHale  retractor was placed through the notch and a lateral Hohmann retractor  placed, and we then drilled through the proximal tibia in line with the  axis of the tibia followed by an intramedullary guide rod and 2-degree  posterior slope cutting guide. The tibial cutting guide was pinned into place  allowing resection of 4 mm of bone medially and about 8 mm of bone  laterally because of her varus deformity. Satisfied with the tibial resection, we then  entered the distal femur 2 mm anterior to the PCL origin with the  intramedullary guide rod and applied the distal femoral cutting guide  set at 11mm, with 5 degrees of valgus. This was pinned along the  epicondylar axis. At this point, the distal femoral cut was accomplished without difficulty. We then sized for a #2.5 femoral component and pinned the guide in 3 degrees of external rotation.The chamfer  cutting guide was pinned into place. The anterior, posterior, and chamfer cuts were accomplished without difficulty followed by  the Sigma RP box cutting guide and the box cut. We also removed posterior osteophytes from the posterior femoral condyles. At this  time, the knee was brought into full extension. We checked our  extension and flexion gaps and found them symmetric at 10mm.  The patella thickness measured at 21 mm. We set the cutting guide at 13 and removed the posterior 9 mm  of the patella sized for 32 button and drilled the lollipop. The knee  was then once again hyperflexed exposing the proximal tibia. We sized for a #10 tibial base plate, applied the smokestack and the conical reamer followed by the the Delta fin keel punch. We then hammered into place the Sigma RP trial femoral component, inserted a 10-mm trial bearing, trial patellar button, and took the knee through range of motion from 0-130 degrees. No thumb pressure was required for patellar  tracking. At this point, all trial components were removed, a double batch of DePuy HV cement with 1500 mg of Zinacef was mixed and applied to all bony metallic mating surfaces except for the posterior condyles of the femur itself. In order, we  hammered into place the tibial tray and removed excess cement, the femoral component and removed excess cement, a 10-mm Sigma RP bearing  was inserted, and the knee brought to full extension with compression.  The patellar button was clamped into place, and excess cement  removed. While the cement cured the wound was irrigated out with normal saline solution pulse lavage, and medium Hemovac drains were placed.. Ligament stability and patellar tracking were checked and found to be excellent. The tourniquet was then released and hemostasis was obtained with cautery. The parapatellar arthrotomy was closed with  Z lock suture over 2 medium hemovac drains. The subcutaneous tissue with 0 and 2-0 undyed  Vicryl  suture, and 4-0 Monocryl.. A dressing of Xeroform,  4 x 4, dressing sponges, Webril, and Ace wrap applied. Needle and sponge count were correct times 2.The patient awakened, extubated, and taken to recovery room without difficulty. Vascular status was normal, pulses 2+ and symmetric.   Khilee Hendricksen A 11/12/2011, 10:37 AM

## 2011-11-12 NOTE — H&P (View-Only) (Signed)
Heather [name] is an 77 y.o. female.   Chief Complaint: left knee DJD HPI: Heather [name] is a 77 year-old female who comes in today with a history of left knee pain.  She has failed intraarticular Cortisone injections, intraarticular Supartz injections and debriding arthroscopy.  She continues to have pain at rest and pain with ambulation.  Difficulty getting in and out of a chair.    Past Medical History  Diagnosis Date  . Coronary artery disease     post percutaneous coronary intervention in 2006  . Transient ischemic attack (TIA)   . Hypertension   . Hyperlipidemia   . Peripheral edema   . Palpitations   . Restless legs   . Fatigue   . GERD (gastroesophageal reflux disease)   . Persistent disorder of initiating or maintaining sleep   . Dermatitis   . H/O: hysterectomy   . S/P inguinal herniorrhaphy   . H/O: knee surgery   . Atrial fibrillation   . Stroke     TIA- series that have resolved- 2000  . Shortness of breath   . DJD (degenerative joint disease) of knee 09/18/2011    knees- both, elbow- R, ankles     Past Surgical History  Procedure Date  . Abdominal hysterectomy   . Knee arthroscopy     2 on left and 1 on right  . Foot surgery     left foot stress fracture repair  . Cardiac catheterization     /w stents - 2005  . Hernia repair     1989- esophageal hernia     Family History  Problem Relation Age of Onset  . Arthritis      family hx of  . Hypertension      family hx of  . Other      family hx of cardiovascular disorder  . Stroke Father     family hx of M 1st degree relative <50  . Coronary artery disease Mother   . Heart disease Mother   . Cancer Brother     bladder  . Breast cancer Neg Hx   . Colon cancer Neg Hx   . Anesthesia problems Neg Hx   . Hypotension Neg Hx   . Malignant hyperthermia Neg Hx   . Pseudochol deficiency Neg Hx   . Stroke Brother   . Cancer Brother    Social History:  reports that she has never smoked. She has never used smokeless  tobacco. She reports that she does not drink alcohol or use illicit drugs.  Allergies: No Known Allergies   (Not in a hospital admission)  No results found for this or any previous visit (from the past 48 hour(s)). No results found.  Review of Systems  Constitutional: Negative.   HENT: Negative.   Eyes: Negative.   Respiratory: Negative.   Cardiovascular: Negative.   Gastrointestinal: Negative.   Genitourinary: Negative.   Musculoskeletal: Positive for joint pain.       Left knee  Skin: Negative.   Neurological: Negative.   Endo/Heme/Allergies: Negative.   Psychiatric/Behavioral: Negative.     Blood pressure 151/72, pulse 64, temperature 98.3 F (36.8 C), height 5\' 3"  (1.6 m), weight 76.658 kg (169 lb), SpO2 97.00%. Physical Exam  Constitutional: She is oriented to person, place, and time. She appears well-developed and well-nourished.  HENT:  Head: Normocephalic and atraumatic.  Mouth/Throat: Oropharynx is clear and moist.  Eyes: Conjunctivae and EOM are normal. Pupils are equal, round, and reactive to light.  Neck:  Normal range of motion. Neck supple.  Cardiovascular: Normal rate and regular rhythm.   Respiratory: Effort normal.  GI: Soft.  Genitourinary:       Not pertinent to current symptomatology therefore not examined.  Musculoskeletal:       She is independently ambulatory with a moderately antalgic gait.  She has active range of motion 0-125 degrees bilaterally.  2+ crepitus bilaterally.  1+ synovitis bilaterally.  Medial joint line tenderness bilaterally with the right being more painful than the left.    Neurological: She is alert and oriented to person, place, and time.  Skin: Skin is warm and dry.  Psychiatric: She has a normal mood and affect. Her behavior is normal. Judgment and thought content normal.     Assessment Patient Active Problem List  Diagnoses  . HYPERLIPIDEMIA  . PERSISTENT DISORDER INITIATING/MAINTAINING SLEEP  . RESTLESS LEGS SYNDROME    . HYPERTENSION  . CAD  . CORONARY ATHEROSCLEROSIS NATIVE CORONARY ARTERY  . GERD  . DERMATITIS  . FATIGUE  . PERIPHERAL EDEMA  . PALPITATIONS  . TRANSIENT ISCHEMIC ATTACK, HX OF  . Chronic knee pain  . Rhinosinusitis  . Renal insufficiency  . DJD (degenerative joint disease) of knee  . DM (diabetes mellitus)  . Pulmonary nodule  . Abdominal  pain, other specified site  . Hyperglycemia  . Pre-syncope   X-RAYS: Standing AP, lateral, flexion and sunrise views shows significant degenerative joint disease with joint space narrowing.  Periarticular spurring and subchondral sclerosis.     Plan At this point in time risks, benefits and possible complications of surgery have been discussed in detail with the patient.  She has been medically cleared for surgery by Dr. Tonny Bollman and Dr. Venita Sheffield.  Postoperatively she will be going to [facility] for rehab.    Pascal Lux 10/30/2011, 4:47 PM

## 2011-11-12 NOTE — Progress Notes (Signed)
CPM on left knee begun at 0-90 degrees

## 2011-11-12 NOTE — Transfer of Care (Signed)
Immediate Anesthesia Transfer of Care Note  Patient: Heather [name]  Procedure(s) Performed: Procedure(s) (LRB): TOTAL KNEE ARTHROPLASTY (Left)  Patient Location: PACU  Anesthesia Type: General  Level of Consciousness: awake and sedated  Airway & Oxygen Therapy: Patient Spontanous Breathing and Patient connected to face mask oxygen  Post-op Assessment: Report given to PACU RN and Post -op Vital signs reviewed and stable  Post vital signs: Reviewed and stable  Complications: No apparent anesthesia complications

## 2011-11-13 ENCOUNTER — Encounter (HOSPITAL_COMMUNITY): Payer: Self-pay | Admitting: Orthopedic Surgery

## 2011-11-13 LAB — BASIC METABOLIC PANEL
BUN: 16 mg/dL (ref 6–23)
Calcium: 8.3 mg/dL — ABNORMAL LOW (ref 8.4–10.5)
Creatinine, Ser: 0.83 mg/dL (ref 0.50–1.10)
GFR calc Af Amer: 78 mL/min — ABNORMAL LOW (ref >90)
GFR calc non Af Amer: 67 mL/min — ABNORMAL LOW (ref >90)
Glucose, Bld: 154 mg/dL — ABNORMAL HIGH (ref 70–99)
Potassium: 3.9 mEq/L (ref 3.5–5.1)

## 2011-11-13 LAB — CBC
HCT: 31.3 % — ABNORMAL LOW (ref 36.0–46.0)
Hemoglobin: 10.6 g/dL — ABNORMAL LOW (ref 12.0–15.0)
MCH: 31.1 pg (ref 26.0–34.0)
MCHC: 33.9 g/dL (ref 30.0–36.0)
MCV: 91.8 fL (ref 78.0–100.0)
RDW: 13 % (ref 11.5–15.5)

## 2011-11-13 MED ORDER — SODIUM CHLORIDE 0.9 % IV BOLUS (SEPSIS)
500.0000 mL | Freq: Once | INTRAVENOUS | Status: AC
  Administered 2011-11-13: 500 mL via INTRAVENOUS

## 2011-11-13 MED ORDER — PANTOPRAZOLE SODIUM 40 MG PO TBEC
80.0000 mg | DELAYED_RELEASE_TABLET | Freq: Two times a day (BID) | ORAL | Status: DC
  Administered 2011-11-13 – 2011-11-15 (×4): 80 mg via ORAL
  Filled 2011-11-13 (×4): qty 2
  Filled 2011-11-13: qty 1

## 2011-11-13 NOTE — Progress Notes (Signed)
Order received, chart reviewed, noted plan is for pt to D/C to SNF.  Will defer OT eval to that facility. Acute OT will sign off.

## 2011-11-13 NOTE — Progress Notes (Signed)
Physical Therapy Treatment Patient Details Name: Heather [name] MRN: [id number] DOB: [birth year] Today's Date: 11/13/2011 Time: 4132-4401 PT Time Calculation (min): 23 min  PT Assessment / Plan / Recommendation Comments on Treatment Session  Pt admitted s/p left TKA and continues to progress.  Pt motivated and able to tolerate BID ambulation as well as therapeutic exercises.    Follow Up Recommendations  Skilled nursing facility    Equipment Recommendations  Defer to next venue    Frequency 7X/week   Plan Discharge plan remains appropriate;Frequency remains appropriate    Precautions / Restrictions Precautions Precautions: Knee Precaution Booklet Issued: No Required Braces or Orthoses: Knee Immobilizer - Left Knee Immobilizer - Left: Discontinue post op day 2 Restrictions Weight Bearing Restrictions: Yes LLE Weight Bearing: Weight bearing as tolerated    Pertinent Vitals/Pain 3/10 in right knee.  Pt repositioned and premedicated.    Mobility  Bed Mobility Bed Mobility: Not assessed Transfers Transfers: Sit to Stand;Stand to Sit Sit to Stand: 4: Min guard;With upper extremity assist;From chair/3-in-1 Stand to Sit: 4: Min guard;With upper extremity assist;To chair/3-in-1 Details for Transfer Assistance: Guarding for balance with cues for hand/left LE placement. Ambulation/Gait Ambulation/Gait Assistance: 4: Min assist Ambulation Distance (Feet): 100 Feet Assistive device: Rolling walker Ambulation/Gait Assistance Details: Assist for balance with pt able to progress toward min (guard) only.  Cues for tall posture and initial contact on left heel during first stage of stance. Gait Pattern: Step-to pattern;Decreased step length - left;Decreased stance time - left;Trunk flexed Stairs: No Wheelchair Mobility Wheelchair Mobility: No    Exercises Total Joint Exercises Ankle Circles/Pumps: AROM;Left;10 reps;Supine Quad Sets: AROM;Left;10 reps;Supine Heel Slides: AAROM;Left;10  reps;Supine Hip ABduction/ADduction: AAROM;Left;10 reps;Supine Straight Leg Raises: AAROM;Left;10 reps;Supine   PT Goals Acute Rehab PT Goals PT Goal Formulation: With patient Time For Goal Achievement: 11/20/11 Potential to Achieve Goals: Good PT Goal: Sit to Stand - Progress: Progressing toward goal PT Goal: Stand to Sit - Progress: Progressing toward goal PT Goal: Ambulate - Progress: Progressing toward goal PT Goal: Perform Home Exercise Program - Progress: Progressing toward goal  Visit Information  Last PT Received On: 11/13/11 Assistance Needed: +1    Subjective Data  Subjective: "I am ready to go again." Patient Stated Goal: Get well.    Cognition  Overall Cognitive Status: Appears within functional limits for tasks assessed/performed Arousal/Alertness: Awake/alert Orientation Level: Appears intact for tasks assessed Behavior During Session: [facility] for tasks performed    Balance  Balance Balance Assessed: No  End of Session PT - End of Session Equipment Utilized During Treatment: Gait belt;Left knee immobilizer Activity Tolerance: Patient tolerated treatment well Patient left: in chair;with call bell/phone within reach;with family/visitor present Nurse Communication: Mobility status    Cephus Shelling 11/13/2011, 11:58 AM  11/13/2011 Cephus Shelling, PT, DPT [phone]

## 2011-11-13 NOTE — Progress Notes (Signed)
UR COMPLETED  

## 2011-11-13 NOTE — Progress Notes (Signed)
Patient ID: Heather [name], female   DOB: 06-12-1935, 77 y.o.   MRN: [id number] PATIENT ID: Heather [name]  MRN: [id number]  DOB/AGE:  July 02, 1935 / 77 y.o.  1 Day Post-Op Procedure(s) (LRB): TOTAL KNEE ARTHROPLASTY (Left)    PROGRESS NOTE Subjective: Patient is alert, oriented, no Nausea, no Vomiting, yes} passing gas, no Bowel Movement. Taking PO yes. Denies SOB, Chest or Calf Pain. Using Incentive Spirometer, PAS in place. Ambulate not yet, CPM 0-90 Patient reports pain as 9 on 0-10 scale  .    Objective: Vital signs in last 24 hours: Filed Vitals:   11/12/11 2145 11/13/11 0200 11/13/11 0530 11/13/11 0657  BP: 148/71 120/69 119/66   Pulse: 71 70 69   Temp: 98.1 F (36.7 C) 98.1 F (36.7 C) 100.4 F (38 C) 98.9 F (37.2 C)  TempSrc:    Oral  Resp: 20 20 18    Height:      Weight:      SpO2: 95% 96% 95%       Intake/Output from previous day: I/O last 3 completed shifts: In: 3090 [P.O.:240; I.V.:2600; Other:250] Out: 3050 [Urine:2800; Drains:250]   Intake/Output this shift:     LABORATORY DATA:  Basename 11/13/11 0608 11/12/11 1110  WBC 7.5 --  HGB 10.6* --  HCT 31.3* --  PLT 121* --  NA 135 --  K 3.9 --  CL 101 --  CO2 26 --  BUN 16 --  CREATININE 0.83 --  GLUCOSE 154* --  GLUCAP -- 164*  INR -- --  CALCIUM 8.3* --    Examination: Neurologically intact ABD soft Sensation intact distally Intact pulses distally Dorsiflexion/Plantar flexion intact Incision: dressing C/D/I}  Assessment:   1 Day Post-Op Procedure(s) (LRB): TOTAL KNEE ARTHROPLASTY (Left) ADDITIONAL DIAGNOSIS:  Acute Blood Loss Anemia Patient Active Problem List  Diagnoses  . HYPERLIPIDEMIA  . PERSISTENT DISORDER INITIATING/MAINTAINING SLEEP  . RESTLESS LEGS SYNDROME  . HYPERTENSION  . CAD  . CORONARY ATHEROSCLEROSIS NATIVE CORONARY ARTERY  . GERD  . DERMATITIS  . FATIGUE  . PERIPHERAL EDEMA  . PALPITATIONS  . TRANSIENT ISCHEMIC ATTACK, HX OF  . Chronic knee pain  .  Rhinosinusitis  . Renal insufficiency  . DJD (degenerative joint disease) of knee  . DM (diabetes mellitus)  . Pulmonary nodule  . Abdominal  pain, other specified site  . Hyperglycemia  . Pre-syncope    Plan: PT/OT WBAT, CPM 6 hrs a day DVT Prophylaxis:  SCDx72hr\Lovenox DISCHARGE PLAN: Skilled Nursing Facility/Rehab patient requests [facility] DISCHARGE NEEDS: fluid bolus times two.  saline lock and dressing change     Heather [name] J 11/13/2011, 7:36 AM

## 2011-11-13 NOTE — Progress Notes (Signed)
Agree with evaluation and goals.  Pt tolerated ambulation with RW and KI on left LE.  Will follow.  11/13/2011 Cephus Shelling, PT, DPT [phone]

## 2011-11-13 NOTE — Progress Notes (Signed)
Physical Therapy Evaluation Patient Details Name: Heather [name] MRN: [id number] DOB: [birth year] Today's Date: 11/13/2011 Time: 8295-6213 PT Time Calculation (min): 55 min  PT Assessment / Plan / Recommendation Clinical Impression  Pt is a 77 yo female admitted s/p L TKA along with PT problem list below. Pt would benefit from acute PT to maximize indepencence and facilitate D/C to SNF. Pt ambulated 100 ft on first time out of bed.     PT Assessment  Patient needs continued PT services    Follow Up Recommendations  Skilled nursing facility    Equipment Recommendations  Defer to next venue    Frequency 7X/week    Precautions / Restrictions Precautions Precautions: Knee Precaution Booklet Issued: No Required Braces or Orthoses: Knee Immobilizer - Left Knee Immobilizer - Left: Discontinue post op day 2 Restrictions Weight Bearing Restrictions: Yes LLE Weight Bearing: Weight bearing as tolerated   Pertinent Vitals/Pain 9/10 pain at L knee. Communicated with RN about pain medication.       Mobility  Bed Mobility Bed Mobility: Supine to Sit;Sitting - Scoot to Edge of Bed Supine to Sit: 4: Min assist;HOB elevated Sitting - Scoot to [place] of Bed: 4: Min assist;With rail Details for Bed Mobility Assistance: Assist with L LE bringing to EOB. Cueing for sequence and L LE placement.  Transfers Transfers: Sit to Stand;Stand to Sit (Trials x 2) Sit to Stand: 4: Min assist;From bed;From chair/3-in-1;With armrests;With upper extremity assist Stand to Sit: 4: Min guard;With upper extremity assist;With armrests;To chair/3-in-1 Details for Transfer Assistance: Assist for sit to stand in both UE placement. Guarding for balance for stand to sit. Cues for L LE placement.  Ambulation/Gait Ambulation/Gait Assistance: 4: Min assist Ambulation Distance (Feet): 100 Feet Assistive device: Rolling walker Ambulation/Gait Assistance Details: Assist for balance. Cues for keeping L LE in walker and  placing it down first and other sequencing. Cues for tall posture and pushing through both UE when stepping.  Gait Pattern: Step-to pattern;Decreased step length - left;Decreased stance time - left Stairs: No Wheelchair Mobility Wheelchair Mobility: No    Exercises Total Joint Exercises Ankle Circles/Pumps: AROM;Left;10 reps;Supine Quad Sets: AROM;Left;10 reps;Supine Heel Slides: AAROM;Left;10 reps;Supine   PT Goals Acute Rehab PT Goals PT Goal Formulation: With patient Time For Goal Achievement: 11/20/11 Potential to Achieve Goals: Good Pt will go Supine/Side to Sit: with modified independence PT Goal: Supine/Side to Sit - Progress: Goal set today Pt will go Sit to Supine/Side: with modified independence PT Goal: Sit to Supine/Side - Progress: Goal set today Pt will go Sit to Stand: with modified independence PT Goal: Sit to Stand - Progress: Goal set today Pt will go Stand to Sit: with modified independence PT Goal: Stand to Sit - Progress: Goal set today Pt will Ambulate: with modified independence;with rolling walker;>150 feet PT Goal: Ambulate - Progress: Goal set today Pt will Perform Home Exercise Program: Independently PT Goal: Perform Home Exercise Program - Progress: Goal set today  Visit Information  Last PT Received On: 11/13/11 Assistance Needed: +1    Subjective Data  Subjective: "I'm in a lot of pain this morning." Patient Stated Goal: Get well.    Prior Functioning  Home Living Lives With: Spouse Available Help at Discharge: Family Type of Home: House Home Access: Level entry Home Layout: One level Bathroom Shower/Tub: Engineer, manufacturing systems: Standard Bathroom Accessibility: Yes Home Adaptive Equipment: None (Raised toilet seat) Prior Function Level of Independence: Independent Able to Take Stairs?: No Driving: Yes Vocation: Retired Special educational needs teacher  Communication: No difficulties Dominant Hand: Right    Cognition  Overall Cognitive  Status: Appears within functional limits for tasks assessed/performed Arousal/Alertness: Awake/alert Orientation Level: Appears intact for tasks assessed Behavior During Session: [facility] for tasks performed    Extremity/Trunk Assessment Right Upper Extremity Assessment RUE ROM/Strength/Tone: Within functional levels RUE Sensation: WFL - Light Touch RUE Coordination: WFL - gross/fine motor Left Upper Extremity Assessment LUE ROM/Strength/Tone: Within functional levels LUE Sensation: WFL - Light Touch LUE Coordination: WFL - gross/fine motor Right Lower Extremity Assessment RLE ROM/Strength/Tone: Within functional levels RLE Sensation: WFL - Light Touch RLE Coordination: WFL - gross/fine motor Left Lower Extremity Assessment LLE ROM/Strength/Tone: Deficits LLE ROM/Strength/Tone Deficits: Knee AAROM 0-54 degrees. 2/5 throughout LLE Sensation: WFL - Light Touch LLE Coordination: WFL - gross/fine motor Trunk Assessment Trunk Assessment: Normal   Balance Balance Balance Assessed: No  End of Session PT - End of Session Equipment Utilized During Treatment: Gait belt Activity Tolerance: Patient tolerated treatment well;Patient limited by pain Patient left: in chair;with call bell/phone within reach Nurse Communication: Mobility status;Patient requests pain meds   Oretha Ellis 11/13/2011, 8:59 AM

## 2011-11-14 ENCOUNTER — Inpatient Hospital Stay (HOSPITAL_COMMUNITY): Payer: Medicare Other

## 2011-11-14 DIAGNOSIS — R509 Fever, unspecified: Secondary | ICD-10-CM | POA: Diagnosis not present

## 2011-11-14 DIAGNOSIS — J9819 Other pulmonary collapse: Secondary | ICD-10-CM | POA: Diagnosis not present

## 2011-11-14 DIAGNOSIS — R918 Other nonspecific abnormal finding of lung field: Secondary | ICD-10-CM | POA: Diagnosis not present

## 2011-11-14 LAB — CBC
Platelets: 114 10*3/uL — ABNORMAL LOW (ref 150–400)
RDW: 12.8 % (ref 11.5–15.5)
WBC: 8.6 10*3/uL (ref 4.0–10.5)

## 2011-11-14 LAB — URINALYSIS, ROUTINE W REFLEX MICROSCOPIC
Bilirubin Urine: NEGATIVE
Nitrite: NEGATIVE
Specific Gravity, Urine: 1.02 (ref 1.005–1.030)
pH: 5 (ref 5.0–8.0)

## 2011-11-14 LAB — BASIC METABOLIC PANEL
Chloride: 99 mEq/L (ref 96–112)
GFR calc Af Amer: 71 mL/min — ABNORMAL LOW (ref >90)
Potassium: 3.7 mEq/L (ref 3.5–5.1)

## 2011-11-14 MED ORDER — BISACODYL 10 MG RE SUPP
10.0000 mg | Freq: Once | RECTAL | Status: AC
  Administered 2011-11-14: 10 mg via RECTAL
  Filled 2011-11-14: qty 1

## 2011-11-14 NOTE — Clinical Social Work Psychosocial (Signed)
     Clinical Social Work Department BRIEF PSYCHOSOCIAL ASSESSMENT 11/14/2011  Patient:  Heather [name], Heather [name]     Account Number:  [id number]     Admit date:  11/12/2011  Clinical Social Worker:  Burnard Hawthorne  Date/Time:  11/14/2011 02:04 PM  Referred by:  Physician  Date Referred:  11/14/2011 Referred for  SNF Placement   Other Referral:   Interview type:  Patient Other interview type:    PSYCHOSOCIAL DATA Living Status:  HUSBAND Admitted from facility:   Level of care:   Primary support name:  Saddie Sandeen  [phone] Primary support relationship to patient:  SPOUSE Degree of support available:   Husband is supportive but does not feel he can manage patient's care at home at this time due to his own health issues.    CURRENT CONCERNS Current Concerns  Post-Acute Placement   Other Concerns:    SOCIAL WORK ASSESSMENT / PLAN Patient has pre-signed with [facility]. Bed is availble when stable per MD. Clovis Cao on shadow chart for MD signature.Patient has pre-signed with [facility].   Assessment/plan status:  Psychosocial Support/Ongoing Assessment of Needs Other assessment/ plan:   Information/referral to community resources:    PATIENTS/FAMILYS RESPONSE TO PLAN OF CARE: Patient has arranged for her SNF placement. She is pleased with plan and states her husband is supportive.

## 2011-11-14 NOTE — Progress Notes (Signed)
Patient ID: Heather [name], female   DOB: 11/12/1934, 77 y.o.   MRN: [id number] PATIENT ID: Heather [name]  MRN: [id number]  DOB/AGE:  1935-01-12 / 77 y.o.  2 Days Post-Op Procedure(s) (LRB): TOTAL KNEE ARTHROPLASTY (Left)    PROGRESS NOTE Subjective: Patient is alert, oriented, no Nausea, no Vomiting, yes} passing gas, no Bowel Movement. Taking PO yes. Denies SOB, Chest or Calf Pain. Using Incentive Spirometer, PAS in place. Ambulate yes, CPM 0-60 Patient reports pain as 4 on 0-10 scale  .    Objective: Vital signs in last 24 hours: Filed Vitals:   11/13/11 2136 11/13/11 2139 11/13/11 2300 11/14/11 0523  BP: 119/76 117/65  136/66  Pulse:  68  75  Temp:  99.9 F (37.7 C) 98.7 F (37.1 C) 100.3 F (37.9 C)  TempSrc:      Resp:  18  18  Height:      Weight:      SpO2:  90%  90%      Intake/Output from previous day: I/O last 3 completed shifts: In: 2530 [P.O.:1080; I.V.:1200; Other:250] Out: 1103 [Urine:1103]   Intake/Output this shift:     LABORATORY DATA:  Basename 11/13/11 0608 11/12/11 1110  WBC 7.5 --  HGB 10.6* --  HCT 31.3* --  PLT 121* --  NA 135 --  K 3.9 --  CL 101 --  CO2 26 --  BUN 16 --  CREATININE 0.83 --  GLUCOSE 154* --  GLUCAP -- 164*  INR -- --  CALCIUM 8.3* --    Examination: Neurologically intact ABD soft Neurovascular intact Sensation intact distally Intact pulses distally Dorsiflexion/Plantar flexion intact Incision: dressing C/D/I and scant drainage}  Assessment:   2 Days Post-Op Procedure(s) (LRB): TOTAL KNEE ARTHROPLASTY (Left) ADDITIONAL DIAGNOSIS:  Acute Blood Loss Anemia and fever Patient Active Problem List  Diagnoses  . HYPERLIPIDEMIA  . PERSISTENT DISORDER INITIATING/MAINTAINING SLEEP  . RESTLESS LEGS SYNDROME  . HYPERTENSION  . CAD  . CORONARY ATHEROSCLEROSIS NATIVE CORONARY ARTERY  . GERD  . DERMATITIS  . FATIGUE  . PERIPHERAL EDEMA  . PALPITATIONS  . TRANSIENT ISCHEMIC ATTACK, HX OF  . Chronic knee pain  .  Rhinosinusitis  . Renal insufficiency  . DJD (degenerative joint disease) of knee  . DM (diabetes mellitus)  . Pulmonary nodule  . Abdominal  pain, other specified site  . Hyperglycemia  . Pre-syncope   Plan: PT/OT WBAT, CPM 5/hrs day until ROM 0-90 degrees,  DVT Prophylaxis:  SCDx72hr\Lovenox DISCHARGE PLAN: Skilled Nursing Facility/Rehab DISCHARGE NEEDS: dulcolax suppository     Heather [name] 11/14/2011, 7:27 AM

## 2011-11-14 NOTE — Progress Notes (Signed)
Pt. Temp 102.1. at 18:45. Tylenol 650 mg given po. Encouraged IS and increased po fluids. Temp at 19:30  100.2 ax. Ans 100.0 oral. Jacqualine Code, PAC notified. Order for 2 view CXR and IS Q one hour x 10 min.

## 2011-11-14 NOTE — Progress Notes (Signed)
Physical Therapy Treatment Patient Details Name: Heather [name] MRN: [id number] DOB: May 28, 1935 Today's Date: 11/14/2011 Time: 4098-1191 PT Time Calculation (min): 29 min  PT Assessment / Plan / Recommendation Comments on Treatment Session  Pt admitted s/p L TKA and is progressing well. Ambulated greater distance this AM. Will continue to benefit from PT to facilitate D/C to a SNF.     Follow Up Recommendations  Skilled nursing facility    Equipment Recommendations  Defer to next venue    Frequency 7X/week   Plan Discharge plan remains appropriate;Frequency remains appropriate    Precautions / Restrictions Precautions Precautions: Knee Precaution Booklet Issued: No Restrictions Weight Bearing Restrictions: Yes LLE Weight Bearing: Weight bearing as tolerated   Pertinent Vitals/Pain Pt reports 5/10 pain at L knee. Pt premedicated by RN and repositioned.      Mobility  Bed Mobility Bed Mobility: Supine to Sit;Sitting - Scoot to [place] of Bed Sitting - Scoot to Edge of Bed: 4: Min assist Details for Bed Mobility Assistance: Assist with L LE bringing to EOB. Cueing for sequence and L LE placement. Cueing for hooking L LE over R LE to lower.  Transfers Transfers: Sit to Stand;Stand to Sit Sit to Stand: 4: Min guard;With upper extremity assist;From bed Stand to Sit: 4: Min guard;With upper extremity assist;With armrests;To chair/3-in-1 Details for Transfer Assistance: Guarding for balance. Cues for hand and L LE placement, and straightening out when sitting. Cueing for hooking L LE over R LE to ease in sitting.  Ambulation/Gait Ambulation/Gait Assistance: 4: Min guard Ambulation Distance (Feet): 140 Feet Assistive device: Rolling walker Ambulation/Gait Assistance Details: Guard for balance. Cueing for tall posture and placing L heel down on initial contact.  Gait Pattern: Step-to pattern;Decreased step length - left;Decreased stance time - left;Trunk flexed Stairs: No Wheelchair  Mobility Wheelchair Mobility: No    Exercises Total Joint Exercises Ankle Circles/Pumps: AROM;Left;10 reps;Supine Quad Sets: AROM;Left;10 reps;Supine Short Arc Quad: AAROM;Left;10 reps;Supine Heel Slides: AAROM;Left;10 reps;Supine Hip ABduction/ADduction: AAROM;Left;10 reps;Supine Straight Leg Raises: AAROM;Left;10 reps;Supine Goniometric ROM: Knee AROM 0-45 degrees   PT Goals Acute Rehab PT Goals PT Goal Formulation: With patient Time For Goal Achievement: 11/20/11 Potential to Achieve Goals: Good PT Goal: Supine/Side to Sit - Progress: Progressing toward goal PT Goal: Sit to Stand - Progress: Progressing toward goal PT Goal: Stand to Sit - Progress: Progressing toward goal PT Goal: Ambulate - Progress: Progressing toward goal PT Goal: Perform Home Exercise Program - Progress: Progressing toward goal  Visit Information  Last PT Received On: 11/14/11 Assistance Needed: +1    Subjective Data  Subjective: "I'm really hot."  Patient Stated Goal: Get well.    Cognition  Overall Cognitive Status: Appears within functional limits for tasks assessed/performed Arousal/Alertness: Awake/alert Orientation Level: Appears intact for tasks assessed Behavior During Session: [facility] for tasks performed    Balance  Balance Balance Assessed: No  End of Session PT - End of Session Equipment Utilized During Treatment: Gait belt Activity Tolerance: Patient tolerated treatment well Patient left: in chair;with call bell/phone within reach Nurse Communication: Mobility status    Oretha Ellis 11/14/2011, 9:05 AM

## 2011-11-14 NOTE — Progress Notes (Addendum)
Physical Therapy Treatment Patient Details Name: OPAL DINNING MRN: [id number] DOB: 1934-09-15 Today's Date: 11/14/2011 Time: 4098-1191 PT Time Calculation (min): 24 min  PT Assessment / Plan / Recommendation Comments on Treatment Session  Pt admitted s/p L TKA and is progressing well. Tolerated BID ambulation >100 ft. Will benefit from PT to facilitate D/C to SNF.     Follow Up Recommendations  Skilled nursing facility    Equipment Recommendations  Defer to next venue    Frequency 7X/week   Plan Discharge plan remains appropriate;Frequency remains appropriate    Precautions / Restrictions Precautions Precautions: Knee Precaution Booklet Issued: No Restrictions Weight Bearing Restrictions: Yes LLE Weight Bearing: Weight bearing as tolerated   Pertinent Vitals/Pain Pt reports 4/10 pain in L knee. Premedicated by RN and left with ice pack on L knee.     Mobility  Bed Mobility Bed Mobility: Not assessed Transfers Transfers: Sit to Stand;Stand to Sit Sit to Stand: 4: Min guard;With upper extremity assist;With armrests;From chair/3-in-1 Stand to Sit: 5: Supervision;With upper extremity assist;To chair/3-in-1;With armrests Details for Transfer Assistance: Guarding for balance. Cues for L LE placement and to slowly lower onto chair.  Ambulation/Gait Ambulation/Gait Assistance: 4: Min guard Ambulation Distance (Feet): 140 Feet Assistive device: Rolling walker Ambulation/Gait Assistance Details: Guard for balance. Cue for tall posture.  Gait Pattern: Step-to pattern;Decreased step length - left;Decreased stance time - left;Trunk flexed Stairs: No Wheelchair Mobility Wheelchair Mobility: No        PT Goals Acute Rehab PT Goals PT Goal Formulation: With patient Time For Goal Achievement: 11/20/11 Potential to Achieve Goals: Good PT Goal: Sit to Stand - Progress: Progressing toward goal PT Goal: Stand to Sit - Progress: Progressing toward goal PT Goal: Ambulate - Progress:  Progressing toward goal   Visit Information  Last PT Received On: 11/14/11 Assistance Needed: +1    Subjective Data  Subjective: "I'm ready to walk again." Patient Stated Goal: Get well.    Cognition  Overall Cognitive Status: Appears within functional limits for tasks assessed/performed Arousal/Alertness: Awake/alert Orientation Level: Appears intact for tasks assessed Behavior During Session: [facility] for tasks performed    Balance  Balance Balance Assessed: No  End of Session PT - End of Session Equipment Utilized During Treatment: Gait belt Activity Tolerance: Patient tolerated treatment well Patient left: in chair;with call bell/phone within reach Nurse Communication: Mobility status    Oretha Ellis 11/14/2011, 12:13 PM

## 2011-11-14 NOTE — Progress Notes (Signed)
Agree with treatment.  11/14/2011 Cephus Shelling, PT, DPT [phone]

## 2011-11-14 NOTE — Progress Notes (Signed)
Agree with treatment session.  11/14/2011 Cephus Shelling, PT, DPT [phone]

## 2011-11-14 NOTE — Progress Notes (Signed)
CARE MANAGEMENT NOTE 11/14/2011  Patient:  Heather [name], Heather [name]   Account Number:  [id number]  Date Initiated:  11/14/2011  Documentation initiated by:  Vance Peper  Subjective/Objective Assessment:   77 yr old female s/p left total knee arthroplasty     Action/Plan:   Spoke with patient regarding discharge plans. She will be going to [facility] for shortterm rehab. Was preoperatively setup with [facility].   Anticipated DC Date:  11/15/2011   Anticipated DC Plan:  SKILLED NURSING FACILITY  In-house referral  Clinical Social Worker      DC Planning Services  CM consult      [facility] Choice  NA   Choice offered to / List presented to:  C-1 Patient           Status of service:  Completed, signed off  Discharge Disposition:  SKILLED NURSING FACILITY

## 2011-11-14 NOTE — Progress Notes (Signed)
Agree with treatment note.  11/14/2011 Cephus Shelling, PT, DPT [phone]

## 2011-11-14 NOTE — Clinical Social Work Placement (Addendum)
    Clinical Social Work Department CLINICAL SOCIAL WORK PLACEMENT NOTE 11/14/2011  Patient:  Heather [name], Heather [name]  Account Number:  [id number] Admit date:  11/12/2011  Clinical Social Worker:  Lupita Leash Ashtyn Meland, BSW  Date/time:  11/14/2011 02:13 PM  Clinical Social Work is seeking post-discharge placement for this patient at the following level of care:   SKILLED NURSING   (*CSW will update this form in Epic as items are completed)     Patient/family provided with [facility] Department of Clinical Social Work's list of facilities offering this level of care within the geographic area requested by the patient (or if unable, by the patient's family).    Patient/family informed of their freedom to choose among providers that offer the needed level of care, that participate in Medicare, Medicaid or managed care program needed by the patient, have an available bed and are willing to accept the patient.    Patient/family informed of [facility]' ownership interest in [facility], as well as of the fact that they are under no obligation to receive care at this facility.  PASARR submitted to EDS on 11/14/2011 PASARR number received from EDS on  11/15/2011  FL2 transmitted to all facilities in geographic area requested by pt/family on  11/14/2011 FL2 transmitted to all facilities within larger geographic area on NA  Patient informed that his/her managed care company has contracts with or will negotiate with  certain facilities, including the following:   NA     Patient/family informed of bed offers received:  11/14/2011 Patient chooses bed at [facility] Physician recommends and patient chooses bed at  NA  Patient to be transferred to [facility] on  11/15/2011 Patient to be transferred to facility by Ambulance- [facility]  The following physician request were entered in Epic:   Additional Comments: Patient and daughter are pleased with d/c plan. Notified nursing and SNF.  Admission  paperwork completed with patient by facility.  No further SW intervention indicated.

## 2011-11-15 DIAGNOSIS — R509 Fever, unspecified: Secondary | ICD-10-CM | POA: Diagnosis not present

## 2011-11-15 DIAGNOSIS — M6281 Muscle weakness (generalized): Secondary | ICD-10-CM | POA: Diagnosis not present

## 2011-11-15 DIAGNOSIS — K449 Diaphragmatic hernia without obstruction or gangrene: Secondary | ICD-10-CM | POA: Diagnosis not present

## 2011-11-15 DIAGNOSIS — D62 Acute posthemorrhagic anemia: Secondary | ICD-10-CM | POA: Diagnosis not present

## 2011-11-15 DIAGNOSIS — Z96659 Presence of unspecified artificial knee joint: Secondary | ICD-10-CM | POA: Diagnosis not present

## 2011-11-15 DIAGNOSIS — Z7901 Long term (current) use of anticoagulants: Secondary | ICD-10-CM | POA: Diagnosis not present

## 2011-11-15 DIAGNOSIS — G2581 Restless legs syndrome: Secondary | ICD-10-CM | POA: Diagnosis not present

## 2011-11-15 DIAGNOSIS — M25569 Pain in unspecified knee: Secondary | ICD-10-CM | POA: Diagnosis not present

## 2011-11-15 DIAGNOSIS — M1710 Unilateral primary osteoarthritis, unspecified knee: Secondary | ICD-10-CM | POA: Diagnosis not present

## 2011-11-15 DIAGNOSIS — S8990XA Unspecified injury of unspecified lower leg, initial encounter: Secondary | ICD-10-CM | POA: Diagnosis not present

## 2011-11-15 DIAGNOSIS — S99919A Unspecified injury of unspecified ankle, initial encounter: Secondary | ICD-10-CM | POA: Diagnosis not present

## 2011-11-15 DIAGNOSIS — Z471 Aftercare following joint replacement surgery: Secondary | ICD-10-CM | POA: Diagnosis not present

## 2011-11-15 DIAGNOSIS — K219 Gastro-esophageal reflux disease without esophagitis: Secondary | ICD-10-CM | POA: Diagnosis not present

## 2011-11-15 DIAGNOSIS — Z4789 Encounter for other orthopedic aftercare: Secondary | ICD-10-CM | POA: Diagnosis not present

## 2011-11-15 DIAGNOSIS — Z5189 Encounter for other specified aftercare: Secondary | ICD-10-CM | POA: Diagnosis not present

## 2011-11-15 DIAGNOSIS — I2510 Atherosclerotic heart disease of native coronary artery without angina pectoris: Secondary | ICD-10-CM | POA: Diagnosis not present

## 2011-11-15 DIAGNOSIS — R262 Difficulty in walking, not elsewhere classified: Secondary | ICD-10-CM | POA: Diagnosis not present

## 2011-11-15 DIAGNOSIS — Z22321 Carrier or suspected carrier of Methicillin susceptible Staphylococcus aureus: Secondary | ICD-10-CM

## 2011-11-15 DIAGNOSIS — E119 Type 2 diabetes mellitus without complications: Secondary | ICD-10-CM | POA: Diagnosis not present

## 2011-11-15 DIAGNOSIS — E785 Hyperlipidemia, unspecified: Secondary | ICD-10-CM | POA: Diagnosis not present

## 2011-11-15 DIAGNOSIS — I10 Essential (primary) hypertension: Secondary | ICD-10-CM | POA: Diagnosis not present

## 2011-11-15 DIAGNOSIS — M1990 Unspecified osteoarthritis, unspecified site: Secondary | ICD-10-CM | POA: Diagnosis not present

## 2011-11-15 LAB — BASIC METABOLIC PANEL
BUN: 13 mg/dL (ref 6–23)
Chloride: 98 mEq/L (ref 96–112)
GFR calc Af Amer: 70 mL/min — ABNORMAL LOW (ref >90)
Glucose, Bld: 110 mg/dL — ABNORMAL HIGH (ref 70–99)
Potassium: 3.1 mEq/L — ABNORMAL LOW (ref 3.5–5.1)
Sodium: 136 mEq/L (ref 135–145)

## 2011-11-15 LAB — CBC
HCT: 28.7 % — ABNORMAL LOW (ref 36.0–46.0)
Hemoglobin: 9.6 g/dL — ABNORMAL LOW (ref 12.0–15.0)
RDW: 13.1 % (ref 11.5–15.5)
WBC: 7.9 10*3/uL (ref 4.0–10.5)

## 2011-11-15 MED ORDER — BISACODYL 5 MG PO TBEC: 10.0000 mg | DELAYED_RELEASE_TABLET | Freq: Every day | ORAL | Status: AC | PRN

## 2011-11-15 MED ORDER — DSS 100 MG PO CAPS: 100.0000 mg | ORAL_CAPSULE | Freq: Two times a day (BID) | ORAL | Status: AC

## 2011-11-15 MED ORDER — CEPHALEXIN 500 MG PO CAPS
500.0000 mg | ORAL_CAPSULE | Freq: Four times a day (QID) | ORAL | Status: DC
  Administered 2011-11-15: 500 mg via ORAL
  Filled 2011-11-15 (×5): qty 1

## 2011-11-15 MED ORDER — MUPIROCIN 2 % EX OINT: TOPICAL_OINTMENT | Freq: Two times a day (BID) | CUTANEOUS | Status: AC

## 2011-11-15 MED ORDER — CHLORHEXIDINE GLUCONATE CLOTH 2 % EX PADS: 6.0000 | MEDICATED_PAD | Freq: Every day | CUTANEOUS | Status: DC

## 2011-11-15 MED ORDER — OXYCODONE-ACETAMINOPHEN 5-325 MG PO TABS: 1.0000 | ORAL_TABLET | ORAL | Status: AC | PRN

## 2011-11-15 MED ORDER — ENOXAPARIN SODIUM 30 MG/0.3ML ~~LOC~~ SOLN: 30.0000 mg | Freq: Two times a day (BID) | SUBCUTANEOUS | Status: DC

## 2011-11-15 MED ORDER — CEPHALEXIN 500 MG PO CAPS: 500.0000 mg | ORAL_CAPSULE | Freq: Four times a day (QID) | ORAL | Status: AC

## 2011-11-15 NOTE — Discharge Summary (Signed)
. Patient ID: Heather [name] MRN: [id number] DOB/AGE: 75-Jul-1936 75 y.o.  Admit date: 11/12/2011 Discharge date: 11/15/2011  Admission Diagnoses:  Principal Problem:  *DJD (degenerative joint disease) of knee Active Problems:  RESTLESS LEGS SYNDROME  HYPERTENSION  GERD  Chronic knee pain  DM (diabetes mellitus)  Fever  Postoperative anemia due to acute blood loss  Staphylococcus aureus carrier   Discharge Diagnoses:  Same  Past Medical History  Diagnosis Date  . Coronary artery disease     post percutaneous coronary intervention in 2006  . Hyperlipidemia   . Restless legs     takes mirapex daily  . Fatigue   . Persistent disorder of initiating or maintaining sleep   . Dermatitis   . S/P inguinal herniorrhaphy   . DJD (degenerative joint disease) of knee 09/18/2011    knees- both, elbow- R, ankles   . Hypertension     takes medications daily  . GERD (gastroesophageal reflux disease)     prilosec daily  . H/O dizziness     pre-syncope/due to dehydration  . Occasional tremors   . Diverticulitis     hx of  . Gastric ulcer     hx of  . Angina   . Atrial fibrillation   . Dysrhythmia, cardiac     "palpitations"  . Borderline diabetes   . H/O hiatal hernia   . Stroke 2000    TIA- series that have resolved-    Surgeries: Procedure(s): TOTAL KNEE ARTHROPLASTY on 11/12/2011   Consultants:    Discharged Condition: Improved  Hospital Course: Heather [name] is an 77 y.o. female who was admitted 11/12/2011 for operative treatment ofDJD (degenerative joint disease) of knee. Patient has severe unremitting pain that affects sleep, daily activities, and work/hobbies. After pre-op clearance the patient was taken to the operating room on 11/12/2011 and underwent  Procedure(s): TOTAL KNEE ARTHROPLASTY.    Patient was given perioperative antibiotics: Anti-infectives     Start     Dose/Rate Route Frequency Ordered Stop   11/15/11 0800   cephALEXin (KEFLEX) capsule 500 mg        500 mg Oral 4 times per day 11/15/11 0736     11/15/11 0000   cephALEXin (KEFLEX) 500 MG capsule        500 mg Oral Every 6 hours 11/15/11 0743 11/25/11 2359   11/12/11 1500   ceFAZolin (ANCEF) IVPB 2 g/50 mL premix        2 g 100 mL/hr over 30 Minutes Intravenous Every 6 hours 11/12/11 1253 11/13/11 0353   11/12/11 0950   cefUROXime (ZINACEF) injection  Status:  Discontinued          As needed 11/12/11 0951 11/12/11 1100   11/12/11 0716   ceFAZolin (ANCEF) IVPB 1 g/50 mL premix  Status:  Discontinued        1 g 100 mL/hr over 30 Minutes Intravenous 60 min pre-op 11/12/11 0716 11/12/11 1243   11/11/11 0901   ceFAZolin (ANCEF) IVPB 1 g/50 mL premix        1 g 100 mL/hr over 30 Minutes Intravenous 60 min pre-op 11/11/11 0901 11/12/11 0906           Patient was given sequential compression devices, early ambulation, and chemoprophylaxis to prevent DVT.  Fever with neg chest xray, neg UA on post op day 2.   Started Keflex 500mg  QID to be taken until 11/25/2011.  Encourage incentive spirometry  Patient benefited maximally from hospital stay and there were  no complications.    Recent vital signs: Patient Vitals for the past 24 hrs:  BP Temp Temp src Pulse Resp SpO2  11/15/11 0511 136/45 mmHg 98.4 F (36.9 C) Oral 73  18  99 %  11/15/11 0251 - 98.9 F (37.2 C) Oral - - -  11/20/2011 2005 134/45 mmHg 100.6 F (38.1 C) Axillary 83  18  93 %  Nov 20, 2011 1848 - 102.1 F (38.9 C) - - - -  11-20-11 1456 133/53 mmHg 98.4 F (36.9 C) - 81  16  96 %  11-20-11 1007 169/69 mmHg - - 72  - -     Recent laboratory studies:  Basename 11/15/11 0510 11-20-11 0645  WBC 7.9 8.6  HGB 9.6* 9.7*  HCT 28.7* 29.1*  PLT 143* 114*  NA 136 133*  K 3.1* 3.7  CL 98 99  CO2 27 26  BUN 13 13  CREATININE 0.91 0.90  GLUCOSE 110* 119*  INR -- --  CALCIUM 8.7 --     Discharge Medications:   Medication List  As of 11/15/2011  7:46 AM   TAKE these medications         amLODipine 10 MG tablet    Commonly known as: NORVASC   Take 10 mg by mouth every morning.      bisacodyl 5 MG EC tablet   Commonly known as: DULCOLAX   Take 2 tablets (10 mg total) by mouth daily as needed for constipation.      calcium-vitamin D 500-200 MG-UNIT per tablet   Commonly known as: OSCAL WITH D   Take 1 tablet by mouth 2 (two) times daily.      cephALEXin 500 MG capsule   Commonly known as: KEFLEX   Take 1 capsule (500 mg total) by mouth every 6 (six) hours.      Chlorhexidine Gluconate Cloth 2 % Pads   Apply 6 each topically daily at 6 (six) AM.      DSS 100 MG Caps   Take 100 mg by mouth 2 (two) times daily.      enoxaparin 30 MG/0.3ML injection   Commonly known as: LOVENOX   Inject 0.3 mLs (30 mg total) into the skin every 12 (twelve) hours.      isosorbide mononitrate 60 MG 24 hr tablet   Commonly known as: IMDUR   TAKE ONE-HALF TABLET BY MOUTH EVERY DAY      losartan-hydrochlorothiazide 100-25 MG per tablet   Commonly known as: HYZAAR   Take 1 tablet by mouth every morning.      magnesium oxide 400 MG tablet   Commonly known as: MAG-OX   Take 400 mg by mouth 2 (two) times daily.      metoprolol succinate 100 MG 24 hr tablet   Commonly known as: TOPROL-XL   Take 100 mg by mouth 2 (two) times daily. Take with or immediately following a meal.      mupirocin ointment 2 %   Commonly known as: BACTROBAN   Apply topically 2 (two) times daily. For 7 days      omeprazole 40 MG capsule   Commonly known as: PRILOSEC   Take 40 mg by mouth 2 (two) times daily.      oxyCODONE-acetaminophen 5-325 MG per tablet   Commonly known as: PERCOCET   Take 1-2 tablets by mouth every 4 (four) hours as needed.      potassium chloride SA 20 MEQ tablet   Commonly known as: K-DUR,KLOR-CON   Take 20  mEq by mouth daily.      pramipexole 0.25 MG tablet   Commonly known as: MIRAPEX   Take 1 tablet (0.25 mg total) by mouth 2 (two) times daily.      simvastatin 20 MG tablet   Commonly known as:  ZOCOR   Take 1 tablet (20 mg total) by mouth at bedtime.            Diagnostic Studies: Dg Chest 2 View  11/14/2011  *RADIOLOGY REPORT*  Clinical Data: Fever.  CHEST - 2 VIEW  Comparison: 05/1969 12  Findings: The lungs are clear without focal infiltrate, edema, pneumothorax or pleural effusion.  Bullous changes are seen in the right upper lobe. Cardiopericardial silhouette is at upper limits of normal for size.  A small hiatal hernia noted. Imaged bony structures of the thorax are intact.  IMPRESSION: Stable.  There is some minimal basilar atelectasis.  No edema or focal pneumonia.  Original Report Authenticated By: ERIC A. MANSELL, M.D.    Disposition: stable to SNF  Discharge Orders    Future Appointments: Provider: Department: Dept Phone: Center:   01/01/2012 10:30 AM Tonny Bollman, MD [facility] [phone] [facility]   01/24/2012 10:15 AM Edwyna Perfect, MD [facility] [phone] [email or web address]     Future Orders Please Complete By Expires   Diet - low sodium heart healthy      Call MD / Call 911      Comments:   If you experience chest pain or shortness of breath, CALL 911 and be transported to the hospital emergency room.  If you develope a fever above 101 F, pus (white drainage) or increased drainage or redness at the wound, or calf pain, call your surgeon's office.   Constipation Prevention      Comments:   Drink plenty of fluids.  Prune juice may be helpful.  You may use a stool softener, such as Colace (over the counter) 100 mg twice a day.  Use MiraLax (over the counter) for constipation as needed.   Increase activity slowly as tolerated      Weight Bearing as taught in Physical Therapy      Comments:   Use a walker or crutches as instructed.   Discharge instructions      Comments:   Total Knee Replacement Care After Refer to this sheet in the next few weeks. These discharge instructions provide you with general information on caring for yourself after you  leave the hospital. Your caregiver may also give you specific instructions. Your treatment has been planned according to the most current medical practices available, but unavoidable complications sometimes occur. If you have any problems or questions after discharge, please call your caregiver. Regaining a near full range of motion of your knee within the first 3 to 6 weeks after surgery is critical. HOME CARE INSTRUCTIONS  You may resume a normal diet and activities as directed.  Perform exercises as directed.  Place yellow foam block, yellow side up under heel at all times except when in CPM or when walking.  DO NOT modify, tear, cut, or change in any way. You will receive physical therapy daily  Take showers instead of baths until informed otherwise.  Change bandages (dressings)daily Do not take over-the-counter or prescription medicines for pain, discomfort, or fever. Eat a well-balanced diet.  Avoid lifting or driving until you are instructed otherwise.  Make an appointment to see your caregiver for stitches (suture) or staple removal as directed.  If you have  been sent home with a continuous passive motion machine (CPM machine), 0-90 degrees 6 hrs a day   2 hrs a shift SEEK MEDICAL CARE IF: You have swelling of your calf or leg.  You develop shortness of breath or chest pain.  You have redness, swelling, or increasing pain in the wound.  There is pus or any unusual drainage coming from the surgical site.  You notice a bad smell coming from the surgical site or dressing.  The surgical site breaks open after sutures or staples have been removed.  There is persistent bleeding from the suture or staple line.  You are getting worse or are not improving.  You have any other questions or concerns.  SEEK IMMEDIATE MEDICAL CARE IF:  You have a fever.  ny way the yellow foam block.You develop a rash.  You have difficulty breathing.  You develop any reaction or side effects to medicines given.   Your knee motion is decreasing rather than improving.  MAKE SURE YOU:  Understand these instructions.  Will watch your condition.  Will get help right away if you are not doing well or get worse.      CPM      Comments:   Continuous passive motion machine (CPM):      Use the CPM from 0 to 90 for 6 hours per day.     You may break it up into 2 or 3 sessions per day.      Use CPM for 2 weeks or until you are told to stop.   TED hose      Comments:   Use stockings (TED hose) for 2 weeks on both leg(s).  You may remove them at night for sleeping.   Change dressing      Comments:   Change the dressing daily with sterile 4 x 4 inch gauze dressing and apply TED hose.  You may clean the incision with alcohol prior to redressing.   Do not put a pillow under the knee. Place it under the heel.      Comments:   Place yellow foam block, yellow side up under heel at all times except when in CPM or when walking.  DO NOT modify, tear, cut, or change in any way the yellow foam block.         SignedPascal Lux 11/15/2011, 7:46 AM

## 2011-11-15 NOTE — Anesthesia Postprocedure Evaluation (Signed)
Anesthesia Post Note  Patient: Heather [name]  Procedure(s) Performed: Procedure(s) (LRB): TOTAL KNEE ARTHROPLASTY (Left)  Anesthesia type: general  Patient location: PACU  Post pain: Pain level controlled  Post assessment: Patient's Cardiovascular Status Stable  Last Vitals:  Filed Vitals:   11/15/11 1042  BP: 120/62  Pulse: 72  Temp:   Resp:     Post vital signs: Reviewed and stable  Level of consciousness: sedated  Complications: No apparent anesthesia complications

## 2011-11-15 NOTE — Progress Notes (Signed)
Agree with treatment.  11/15/2011 Cephus Shelling, PT, DPT [phone]

## 2011-11-15 NOTE — Discharge Planning (Signed)
Report called to [place] at [facility].  Doyle Askew, RN  11/15/11  1125

## 2011-11-15 NOTE — Progress Notes (Signed)
Physical Therapy Treatment Patient Details Name: Heather [name] MRN: [id number] DOB: 1935/01/19 Today's Date: 11/15/2011 Time: 7829-5621 PT Time Calculation (min): 34 min  PT Assessment / Plan / Recommendation Comments on Treatment Session  Pt admitted s/p L TKA and is progressing well. Ambulated 200 ft this morning  with more normalized gait pattern and is ready for a safe D/C to SNF once medically cleared by MD.     Follow Up Recommendations  Skilled nursing facility    Equipment Recommendations  Defer to next venue    Frequency 7X/week   Plan Discharge plan remains appropriate;Frequency remains appropriate    Precautions / Restrictions Precautions Precautions: Knee Precaution Booklet Issued: No Restrictions Weight Bearing Restrictions: Yes LLE Weight Bearing: Weight bearing as tolerated   Pertinent Vitals/Pain Pt reports 2/10 pain at L knee. RN aware and pt repositioned with ice pack applied to L Knee.      Mobility  Bed Mobility Bed Mobility: Supine to Sit;Sitting - Scoot to Edge of Bed Supine to Sit: 4: Min guard Sitting - Scoot to [place] of Bed: 4: Min guard Details for Bed Mobility Assistance: Guard for balance. Cues for both hand placement, sequence, and L LE placement.  Transfers Transfers: Sit to Stand;Stand to Sit (Trials x 2) Sit to Stand: 4: Min guard;With upper extremity assist;With armrests;From bed;From chair/3-in-1 Stand to Sit: 4: Min guard;With upper extremity assist;With armrests;To chair/3-in-1 Details for Transfer Assistance: Guarding for balance. Cues for slow descent and L LE placement.  Ambulation/Gait Ambulation/Gait Assistance: 4: Min guard Ambulation Distance (Feet): 200 Feet Assistive device: Rolling walker Ambulation/Gait Assistance Details: Guard for balance. Cues for tall posture and placing R LE in front of L LE to normalize gait pattern.  Gait Pattern: Step-through pattern;Decreased step length - left;Decreased stance time - left Stairs:  No Wheelchair Mobility Wheelchair Mobility: No    Exercises Total Joint Exercises Ankle Circles/Pumps: AROM;Left;10 reps;Supine Quad Sets: AROM;Left;10 reps;Supine Short Arc Quad: AAROM;Left;10 reps;Supine Heel Slides: AAROM;Left;10 reps;Supine Hip ABduction/ADduction: AAROM;Left;10 reps;Supine Straight Leg Raises: AAROM;Left;10 reps;Supine Goniometric ROM: L Knee AAROM 0-59 degrees   PT Goals Acute Rehab PT Goals PT Goal Formulation: With patient Time For Goal Achievement: 11/20/11 Potential to Achieve Goals: Good PT Goal: Supine/Side to Sit - Progress: Progressing toward goal PT Goal: Sit to Stand - Progress: Progressing toward goal PT Goal: Stand to Sit - Progress: Progressing toward goal PT Goal: Ambulate - Progress: Progressing toward goal PT Goal: Perform Home Exercise Program - Progress: Progressing toward goal  Visit Information  Last PT Received On: 11/15/11 Assistance Needed: +1    Subjective Data  Subjective: "My knee is feeling a lot better." Patient Stated Goal: Get well.    Cognition  Overall Cognitive Status: Appears within functional limits for tasks assessed/performed Arousal/Alertness: Awake/alert Orientation Level: Appears intact for tasks assessed Behavior During Session: [facility] for tasks performed    Balance  Balance Balance Assessed: No  End of Session PT - End of Session Equipment Utilized During Treatment: Gait belt Activity Tolerance: Patient tolerated treatment well Patient left: in chair;with call bell/phone within reach Nurse Communication: Mobility status    Oretha Ellis 11/15/2011, 9:11 AM

## 2011-11-19 DIAGNOSIS — Z7901 Long term (current) use of anticoagulants: Secondary | ICD-10-CM | POA: Diagnosis not present

## 2011-11-19 DIAGNOSIS — G2581 Restless legs syndrome: Secondary | ICD-10-CM | POA: Diagnosis not present

## 2011-11-19 DIAGNOSIS — I2510 Atherosclerotic heart disease of native coronary artery without angina pectoris: Secondary | ICD-10-CM | POA: Diagnosis not present

## 2011-11-19 DIAGNOSIS — D62 Acute posthemorrhagic anemia: Secondary | ICD-10-CM | POA: Diagnosis not present

## 2011-11-19 DIAGNOSIS — Z4789 Encounter for other orthopedic aftercare: Secondary | ICD-10-CM | POA: Diagnosis not present

## 2011-11-19 DIAGNOSIS — M1990 Unspecified osteoarthritis, unspecified site: Secondary | ICD-10-CM | POA: Diagnosis not present

## 2011-11-20 ENCOUNTER — Ambulatory Visit: Payer: Medicare Other | Admitting: Internal Medicine

## 2011-11-26 DIAGNOSIS — M1710 Unilateral primary osteoarthritis, unspecified knee: Secondary | ICD-10-CM | POA: Diagnosis not present

## 2011-11-26 DIAGNOSIS — Z471 Aftercare following joint replacement surgery: Secondary | ICD-10-CM | POA: Diagnosis not present

## 2011-12-10 DIAGNOSIS — IMO0002 Reserved for concepts with insufficient information to code with codable children: Secondary | ICD-10-CM | POA: Diagnosis not present

## 2011-12-10 DIAGNOSIS — M1710 Unilateral primary osteoarthritis, unspecified knee: Secondary | ICD-10-CM | POA: Diagnosis not present

## 2011-12-10 DIAGNOSIS — I2510 Atherosclerotic heart disease of native coronary artery without angina pectoris: Secondary | ICD-10-CM | POA: Diagnosis not present

## 2011-12-10 DIAGNOSIS — Z471 Aftercare following joint replacement surgery: Secondary | ICD-10-CM | POA: Diagnosis not present

## 2011-12-10 DIAGNOSIS — IMO0001 Reserved for inherently not codable concepts without codable children: Secondary | ICD-10-CM | POA: Diagnosis not present

## 2011-12-10 DIAGNOSIS — E119 Type 2 diabetes mellitus without complications: Secondary | ICD-10-CM | POA: Diagnosis not present

## 2011-12-12 DIAGNOSIS — IMO0001 Reserved for inherently not codable concepts without codable children: Secondary | ICD-10-CM | POA: Diagnosis not present

## 2011-12-12 DIAGNOSIS — E119 Type 2 diabetes mellitus without complications: Secondary | ICD-10-CM | POA: Diagnosis not present

## 2011-12-12 DIAGNOSIS — I2510 Atherosclerotic heart disease of native coronary artery without angina pectoris: Secondary | ICD-10-CM | POA: Diagnosis not present

## 2011-12-12 DIAGNOSIS — IMO0002 Reserved for concepts with insufficient information to code with codable children: Secondary | ICD-10-CM | POA: Diagnosis not present

## 2011-12-12 DIAGNOSIS — M1710 Unilateral primary osteoarthritis, unspecified knee: Secondary | ICD-10-CM | POA: Diagnosis not present

## 2011-12-12 DIAGNOSIS — Z471 Aftercare following joint replacement surgery: Secondary | ICD-10-CM | POA: Diagnosis not present

## 2011-12-13 DIAGNOSIS — I2510 Atherosclerotic heart disease of native coronary artery without angina pectoris: Secondary | ICD-10-CM | POA: Diagnosis not present

## 2011-12-13 DIAGNOSIS — M1710 Unilateral primary osteoarthritis, unspecified knee: Secondary | ICD-10-CM | POA: Diagnosis not present

## 2011-12-13 DIAGNOSIS — IMO0001 Reserved for inherently not codable concepts without codable children: Secondary | ICD-10-CM | POA: Diagnosis not present

## 2011-12-13 DIAGNOSIS — Z471 Aftercare following joint replacement surgery: Secondary | ICD-10-CM | POA: Diagnosis not present

## 2011-12-13 DIAGNOSIS — IMO0002 Reserved for concepts with insufficient information to code with codable children: Secondary | ICD-10-CM | POA: Diagnosis not present

## 2011-12-13 DIAGNOSIS — E119 Type 2 diabetes mellitus without complications: Secondary | ICD-10-CM | POA: Diagnosis not present

## 2011-12-14 DIAGNOSIS — Z471 Aftercare following joint replacement surgery: Secondary | ICD-10-CM | POA: Diagnosis not present

## 2011-12-14 DIAGNOSIS — I2510 Atherosclerotic heart disease of native coronary artery without angina pectoris: Secondary | ICD-10-CM | POA: Diagnosis not present

## 2011-12-14 DIAGNOSIS — M1710 Unilateral primary osteoarthritis, unspecified knee: Secondary | ICD-10-CM | POA: Diagnosis not present

## 2011-12-14 DIAGNOSIS — IMO0002 Reserved for concepts with insufficient information to code with codable children: Secondary | ICD-10-CM | POA: Diagnosis not present

## 2011-12-14 DIAGNOSIS — E119 Type 2 diabetes mellitus without complications: Secondary | ICD-10-CM | POA: Diagnosis not present

## 2011-12-14 DIAGNOSIS — IMO0001 Reserved for inherently not codable concepts without codable children: Secondary | ICD-10-CM | POA: Diagnosis not present

## 2011-12-16 DIAGNOSIS — E119 Type 2 diabetes mellitus without complications: Secondary | ICD-10-CM | POA: Diagnosis not present

## 2011-12-16 DIAGNOSIS — M1710 Unilateral primary osteoarthritis, unspecified knee: Secondary | ICD-10-CM | POA: Diagnosis not present

## 2011-12-16 DIAGNOSIS — IMO0002 Reserved for concepts with insufficient information to code with codable children: Secondary | ICD-10-CM | POA: Diagnosis not present

## 2011-12-16 DIAGNOSIS — IMO0001 Reserved for inherently not codable concepts without codable children: Secondary | ICD-10-CM | POA: Diagnosis not present

## 2011-12-16 DIAGNOSIS — Z471 Aftercare following joint replacement surgery: Secondary | ICD-10-CM | POA: Diagnosis not present

## 2011-12-16 DIAGNOSIS — I2510 Atherosclerotic heart disease of native coronary artery without angina pectoris: Secondary | ICD-10-CM | POA: Diagnosis not present

## 2011-12-17 DIAGNOSIS — IMO0001 Reserved for inherently not codable concepts without codable children: Secondary | ICD-10-CM | POA: Diagnosis not present

## 2011-12-17 DIAGNOSIS — I2510 Atherosclerotic heart disease of native coronary artery without angina pectoris: Secondary | ICD-10-CM | POA: Diagnosis not present

## 2011-12-17 DIAGNOSIS — IMO0002 Reserved for concepts with insufficient information to code with codable children: Secondary | ICD-10-CM | POA: Diagnosis not present

## 2011-12-17 DIAGNOSIS — M1710 Unilateral primary osteoarthritis, unspecified knee: Secondary | ICD-10-CM | POA: Diagnosis not present

## 2011-12-17 DIAGNOSIS — E119 Type 2 diabetes mellitus without complications: Secondary | ICD-10-CM | POA: Diagnosis not present

## 2011-12-17 DIAGNOSIS — Z471 Aftercare following joint replacement surgery: Secondary | ICD-10-CM | POA: Diagnosis not present

## 2011-12-18 DIAGNOSIS — Z471 Aftercare following joint replacement surgery: Secondary | ICD-10-CM | POA: Diagnosis not present

## 2011-12-18 DIAGNOSIS — IMO0002 Reserved for concepts with insufficient information to code with codable children: Secondary | ICD-10-CM | POA: Diagnosis not present

## 2011-12-18 DIAGNOSIS — E119 Type 2 diabetes mellitus without complications: Secondary | ICD-10-CM | POA: Diagnosis not present

## 2011-12-18 DIAGNOSIS — M1710 Unilateral primary osteoarthritis, unspecified knee: Secondary | ICD-10-CM | POA: Diagnosis not present

## 2011-12-18 DIAGNOSIS — IMO0001 Reserved for inherently not codable concepts without codable children: Secondary | ICD-10-CM | POA: Diagnosis not present

## 2011-12-18 DIAGNOSIS — I2510 Atherosclerotic heart disease of native coronary artery without angina pectoris: Secondary | ICD-10-CM | POA: Diagnosis not present

## 2011-12-19 DIAGNOSIS — I2510 Atherosclerotic heart disease of native coronary artery without angina pectoris: Secondary | ICD-10-CM | POA: Diagnosis not present

## 2011-12-19 DIAGNOSIS — M1710 Unilateral primary osteoarthritis, unspecified knee: Secondary | ICD-10-CM | POA: Diagnosis not present

## 2011-12-19 DIAGNOSIS — E119 Type 2 diabetes mellitus without complications: Secondary | ICD-10-CM | POA: Diagnosis not present

## 2011-12-19 DIAGNOSIS — IMO0002 Reserved for concepts with insufficient information to code with codable children: Secondary | ICD-10-CM | POA: Diagnosis not present

## 2011-12-19 DIAGNOSIS — Z471 Aftercare following joint replacement surgery: Secondary | ICD-10-CM | POA: Diagnosis not present

## 2011-12-19 DIAGNOSIS — IMO0001 Reserved for inherently not codable concepts without codable children: Secondary | ICD-10-CM | POA: Diagnosis not present

## 2011-12-20 DIAGNOSIS — Z471 Aftercare following joint replacement surgery: Secondary | ICD-10-CM | POA: Diagnosis not present

## 2011-12-20 DIAGNOSIS — IMO0001 Reserved for inherently not codable concepts without codable children: Secondary | ICD-10-CM | POA: Diagnosis not present

## 2011-12-20 DIAGNOSIS — IMO0002 Reserved for concepts with insufficient information to code with codable children: Secondary | ICD-10-CM | POA: Diagnosis not present

## 2011-12-20 DIAGNOSIS — E119 Type 2 diabetes mellitus without complications: Secondary | ICD-10-CM | POA: Diagnosis not present

## 2011-12-20 DIAGNOSIS — M1710 Unilateral primary osteoarthritis, unspecified knee: Secondary | ICD-10-CM | POA: Diagnosis not present

## 2011-12-20 DIAGNOSIS — I2510 Atherosclerotic heart disease of native coronary artery without angina pectoris: Secondary | ICD-10-CM | POA: Diagnosis not present

## 2011-12-24 DIAGNOSIS — E119 Type 2 diabetes mellitus without complications: Secondary | ICD-10-CM | POA: Diagnosis not present

## 2011-12-24 DIAGNOSIS — M1710 Unilateral primary osteoarthritis, unspecified knee: Secondary | ICD-10-CM | POA: Diagnosis not present

## 2011-12-24 DIAGNOSIS — Z471 Aftercare following joint replacement surgery: Secondary | ICD-10-CM | POA: Diagnosis not present

## 2011-12-24 DIAGNOSIS — M25569 Pain in unspecified knee: Secondary | ICD-10-CM | POA: Diagnosis not present

## 2011-12-24 DIAGNOSIS — IMO0002 Reserved for concepts with insufficient information to code with codable children: Secondary | ICD-10-CM | POA: Diagnosis not present

## 2011-12-24 DIAGNOSIS — I2510 Atherosclerotic heart disease of native coronary artery without angina pectoris: Secondary | ICD-10-CM | POA: Diagnosis not present

## 2011-12-24 DIAGNOSIS — IMO0001 Reserved for inherently not codable concepts without codable children: Secondary | ICD-10-CM | POA: Diagnosis not present

## 2011-12-26 DIAGNOSIS — IMO0001 Reserved for inherently not codable concepts without codable children: Secondary | ICD-10-CM | POA: Diagnosis not present

## 2011-12-26 DIAGNOSIS — E119 Type 2 diabetes mellitus without complications: Secondary | ICD-10-CM | POA: Diagnosis not present

## 2011-12-26 DIAGNOSIS — IMO0002 Reserved for concepts with insufficient information to code with codable children: Secondary | ICD-10-CM | POA: Diagnosis not present

## 2011-12-26 DIAGNOSIS — M1710 Unilateral primary osteoarthritis, unspecified knee: Secondary | ICD-10-CM | POA: Diagnosis not present

## 2011-12-26 DIAGNOSIS — Z471 Aftercare following joint replacement surgery: Secondary | ICD-10-CM | POA: Diagnosis not present

## 2011-12-26 DIAGNOSIS — I2510 Atherosclerotic heart disease of native coronary artery without angina pectoris: Secondary | ICD-10-CM | POA: Diagnosis not present

## 2011-12-27 DIAGNOSIS — I2510 Atherosclerotic heart disease of native coronary artery without angina pectoris: Secondary | ICD-10-CM | POA: Diagnosis not present

## 2011-12-27 DIAGNOSIS — IMO0001 Reserved for inherently not codable concepts without codable children: Secondary | ICD-10-CM | POA: Diagnosis not present

## 2011-12-27 DIAGNOSIS — IMO0002 Reserved for concepts with insufficient information to code with codable children: Secondary | ICD-10-CM | POA: Diagnosis not present

## 2011-12-27 DIAGNOSIS — M1710 Unilateral primary osteoarthritis, unspecified knee: Secondary | ICD-10-CM | POA: Diagnosis not present

## 2011-12-27 DIAGNOSIS — Z471 Aftercare following joint replacement surgery: Secondary | ICD-10-CM | POA: Diagnosis not present

## 2011-12-27 DIAGNOSIS — E119 Type 2 diabetes mellitus without complications: Secondary | ICD-10-CM | POA: Diagnosis not present

## 2011-12-31 DIAGNOSIS — M629 Disorder of muscle, unspecified: Secondary | ICD-10-CM | POA: Diagnosis not present

## 2011-12-31 DIAGNOSIS — M25669 Stiffness of unspecified knee, not elsewhere classified: Secondary | ICD-10-CM | POA: Diagnosis not present

## 2011-12-31 DIAGNOSIS — R269 Unspecified abnormalities of gait and mobility: Secondary | ICD-10-CM | POA: Diagnosis not present

## 2012-01-01 ENCOUNTER — Ambulatory Visit (INDEPENDENT_AMBULATORY_CARE_PROVIDER_SITE_OTHER): Payer: Medicare Other | Admitting: Cardiovascular Disease

## 2012-01-01 ENCOUNTER — Encounter: Payer: Self-pay | Admitting: Cardiovascular Disease

## 2012-01-01 VITALS — BP 130/72 | HR 76 | Ht 63.0 in | Wt 163.4 lb

## 2012-01-01 DIAGNOSIS — I2510 Atherosclerotic heart disease of native coronary artery without angina pectoris: Secondary | ICD-10-CM | POA: Diagnosis not present

## 2012-01-01 NOTE — Patient Instructions (Signed)
Your physician wants you to follow-up in: 1 YEAR with Dr Cooper.  You will receive a reminder letter in the mail two months in advance. If you don't receive a letter, please call our office to schedule the follow-up appointment.  Your physician recommends that you continue on your current medications as directed. Please refer to the Current Medication list given to you today.  

## 2012-01-02 DIAGNOSIS — R269 Unspecified abnormalities of gait and mobility: Secondary | ICD-10-CM | POA: Diagnosis not present

## 2012-01-02 DIAGNOSIS — M25669 Stiffness of unspecified knee, not elsewhere classified: Secondary | ICD-10-CM | POA: Diagnosis not present

## 2012-01-02 DIAGNOSIS — M629 Disorder of muscle, unspecified: Secondary | ICD-10-CM | POA: Diagnosis not present

## 2012-01-03 DIAGNOSIS — M629 Disorder of muscle, unspecified: Secondary | ICD-10-CM | POA: Diagnosis not present

## 2012-01-03 DIAGNOSIS — R269 Unspecified abnormalities of gait and mobility: Secondary | ICD-10-CM | POA: Diagnosis not present

## 2012-01-03 DIAGNOSIS — M25669 Stiffness of unspecified knee, not elsewhere classified: Secondary | ICD-10-CM | POA: Diagnosis not present

## 2012-01-04 ENCOUNTER — Ambulatory Visit (INDEPENDENT_AMBULATORY_CARE_PROVIDER_SITE_OTHER): Payer: Medicare Other | Admitting: Internal Medicine

## 2012-01-04 ENCOUNTER — Encounter: Payer: Self-pay | Admitting: Internal Medicine

## 2012-01-04 VITALS — BP 110/60 | HR 66 | Temp 97.8°F | Resp 18 | Ht 62.0 in | Wt 162.0 lb

## 2012-01-04 DIAGNOSIS — F411 Generalized anxiety disorder: Secondary | ICD-10-CM

## 2012-01-04 DIAGNOSIS — D649 Anemia, unspecified: Secondary | ICD-10-CM | POA: Diagnosis not present

## 2012-01-04 DIAGNOSIS — E876 Hypokalemia: Secondary | ICD-10-CM | POA: Diagnosis not present

## 2012-01-04 DIAGNOSIS — F419 Anxiety disorder, unspecified: Secondary | ICD-10-CM

## 2012-01-04 LAB — CBC WITH DIFFERENTIAL/PLATELET
Basophils Absolute: 0 10*3/uL (ref 0.0–0.1)
Eosinophils Relative: 3 % (ref 0–5)
Lymphocytes Relative: 22 % (ref 12–46)
Lymphs Abs: 1.4 10*3/uL (ref 0.7–4.0)
MCV: 87.5 fL (ref 78.0–100.0)
Neutro Abs: 4.1 10*3/uL (ref 1.7–7.7)
Neutrophils Relative %: 65 % (ref 43–77)
Platelets: 219 10*3/uL (ref 150–400)
RBC: 4.47 MIL/uL (ref 3.87–5.11)
RDW: 13.8 % (ref 11.5–15.5)
WBC: 6.3 10*3/uL (ref 4.0–10.5)

## 2012-01-04 LAB — BASIC METABOLIC PANEL
CO2: 28 mEq/L (ref 19–32)
Chloride: 102 mEq/L (ref 96–112)
Potassium: 4.3 mEq/L (ref 3.5–5.3)
Sodium: 141 mEq/L (ref 135–145)

## 2012-01-04 LAB — TSH: TSH: 1.637 u[IU]/mL (ref 0.350–4.500)

## 2012-01-04 MED ORDER — AMLODIPINE BESYLATE 10 MG PO TABS: 10.0000 mg | ORAL_TABLET | ORAL | Status: DC

## 2012-01-04 MED ORDER — ALPRAZOLAM 0.25 MG PO TABS: 0.2500 mg | ORAL_TABLET | Freq: Two times a day (BID) | ORAL | Status: DC | PRN

## 2012-01-04 MED ORDER — CITALOPRAM HYDROBROMIDE 20 MG PO TABS: 20.0000 mg | ORAL_TABLET | Freq: Every day | ORAL | Status: DC

## 2012-01-04 NOTE — Patient Instructions (Signed)
Please decrease your omeprazole dose to 1/2 tablet (20mg ) while taking celexa

## 2012-01-05 DIAGNOSIS — F418 Other specified anxiety disorders: Secondary | ICD-10-CM | POA: Insufficient documentation

## 2012-01-05 DIAGNOSIS — E876 Hypokalemia: Secondary | ICD-10-CM | POA: Insufficient documentation

## 2012-01-05 DIAGNOSIS — D649 Anemia, unspecified: Secondary | ICD-10-CM | POA: Insufficient documentation

## 2012-01-05 NOTE — Assessment & Plan Note (Signed)
Obtain cbc  

## 2012-01-05 NOTE — Progress Notes (Signed)
  Subjective:    Patient ID: Heather [name], female    DOB: 30-Jun-1935, 77 y.o.   MRN: [id number]  HPI Pt presents to clinic for evaluation of anxiety. Notes depressed mood and anxiety since undergoing knee surgery. Has intermittent crying episodes. Denies si/hi. No other contributing triggers.   Past Medical History  Diagnosis Date  . Coronary artery disease     post percutaneous coronary intervention in 2006  . Hyperlipidemia   . Restless legs     takes mirapex daily  . Fatigue   . Persistent disorder of initiating or maintaining sleep   . Dermatitis   . S/P inguinal herniorrhaphy   . DJD (degenerative joint disease) of knee 09/18/2011    knees- both, elbow- R, ankles   . Hypertension     takes medications daily  . GERD (gastroesophageal reflux disease)     prilosec daily  . H/O dizziness     pre-syncope/due to dehydration  . Occasional tremors   . Diverticulitis     hx of  . Gastric ulcer     hx of  . Angina   . Atrial fibrillation   . Dysrhythmia, cardiac     "palpitations"  . Borderline diabetes   . H/O hiatal hernia   . Stroke 2000    TIA- series that have resolved-   Past Surgical History  Procedure Date  . Abdominal hysterectomy   . Knee arthroscopy     2 on left and 1 on right  . Foot surgery     left foot stress fracture repair  . Hernia repair     1989- esophageal hernia   . Upper gastrointestinal endoscopy 2013  . Total knee arthroplasty 11/12/2011    Procedure: TOTAL KNEE ARTHROPLASTY;  Surgeon: Nilda Simmer, MD;  Location: [facility] OR;  Service: Orthopedics;  Laterality: Left;  Dr Thurston Hole wants 90 minutes for this case  . Brain surgery   . Coronary angioplasty 03/2005; 06/2005    "1" plus "1"= "2 total"    reports that she has never smoked. She has never used smokeless tobacco. She reports that she does not drink alcohol or use illicit drugs. family history includes Arthritis in an unspecified family member; Cancer in her brothers; Coronary artery disease  in her mother; Heart disease in her mother; Hypertension in an unspecified family member; Other in an unspecified family member; and Stroke in her brother and father.  There is no history of Breast cancer, and Colon cancer, and Anesthesia problems, and Hypotension, and Malignant hyperthermia, and Pseudochol deficiency, . No Known Allergies   Review of Systems see hpi     Objective:   Physical Exam  Nursing note and vitals reviewed. Constitutional: She appears well-developed and well-nourished. No distress.  HENT:  Head: Normocephalic and atraumatic.  Right Ear: External ear normal.  Left Ear: External ear normal.  Eyes: Conjunctivae are normal. No scleral icterus.  Neck: Neck supple.  Neurological: She is alert.  Skin: Skin is warm and dry. She is not diaphoretic.  Psychiatric: She has a normal mood and affect. Her speech is normal and behavior is normal. Judgment normal. Cognition and memory are normal. She expresses no homicidal and no suicidal ideation.          Assessment & Plan:

## 2012-01-05 NOTE — Assessment & Plan Note (Signed)
With depressive features. Declines therapist referral. Begin celexa qd and xanax prn. Aware of potential for addiction/tolerance. F/u 3 weeks or sooner if needed. Obtain tsh

## 2012-01-05 NOTE — Assessment & Plan Note (Signed)
Obtain chem7 

## 2012-01-14 ENCOUNTER — Encounter: Payer: Self-pay | Admitting: Cardiovascular Disease

## 2012-01-14 NOTE — Progress Notes (Signed)
HPI:  77 year-old woman presenting for follow-up evaluation. She has undergone multivessel coronary stenting in 2006. She underwent total knee replacement a few months ago. Prior to TKA she developed near-syncope on several occasions. A dobutamine stress echo was done prior to surgery and showed no abnormalities.  She has had no further problems with syncope or lightheadedness. She denies chest pain or dyspnea. She has become extremely anxious, tremulous, and tearful since undergoing surgery.   Outpatient Encounter Prescriptions as of 01/01/2012  Medication Sig Dispense Refill  . aspirin 81 MG tablet Take 81 mg by mouth daily.      . calcium-vitamin D (OSCAL WITH D) 500-200 MG-UNIT per tablet Take 1 tablet by mouth 2 (two) times daily.      . diazepam (VALIUM) 2 MG tablet Take 2 mg by mouth every 8 (eight) hours as needed.      . isosorbide mononitrate (IMDUR) 60 MG 24 hr tablet TAKE ONE-HALF TABLET BY MOUTH EVERY DAY  15 tablet  9  . losartan-hydrochlorothiazide (HYZAAR) 100-25 MG per tablet Take 1 tablet by mouth every morning.       . magnesium oxide (MAG-OX) 400 MG tablet Take 400 mg by mouth 2 (two) times daily.       . metoprolol succinate (TOPROL-XL) 100 MG 24 hr tablet Take 100 mg by mouth 2 (two) times daily. Take with or immediately following a meal.      . omeprazole (PRILOSEC) 40 MG capsule Take 40 mg by mouth 2 (two) times daily.       Marland Kitchen oxyCODONE-acetaminophen (PERCOCET) 5-325 MG per tablet Take 2 tablets by mouth every 6 (six) hours as needed.       . potassium chloride SA (K-DUR,KLOR-CON) 20 MEQ tablet Take 20 mEq by mouth daily.      . pramipexole (MIRAPEX) 0.25 MG tablet Take 1 tablet (0.25 mg total) by mouth 2 (two) times daily.  60 tablet  3  . simvastatin (ZOCOR) 20 MG tablet Take 1 tablet (20 mg total) by mouth at bedtime.  30 tablet  11  . DISCONTD: amLODipine (NORVASC) 10 MG tablet Take 10 mg by mouth every morning.       Marland Kitchen DISCONTD: Chlorhexidine Gluconate Cloth 2 %  PADS Apply 6 each topically daily at 6 (six) AM.  42 each  0  . DISCONTD: enoxaparin (LOVENOX) 30 MG/0.3ML injection Inject 0.3 mLs (30 mg total) into the skin every 12 (twelve) hours.  22 Syringe  0    No Known Allergies  Past Medical History  Diagnosis Date  . Coronary artery disease     post percutaneous coronary intervention in 2006  . Hyperlipidemia   . Restless legs     takes mirapex daily  . Fatigue   . Persistent disorder of initiating or maintaining sleep   . Dermatitis   . S/P inguinal herniorrhaphy   . DJD (degenerative joint disease) of knee 09/18/2011    knees- both, elbow- R, ankles   . Hypertension     takes medications daily  . GERD (gastroesophageal reflux disease)     prilosec daily  . H/O dizziness     pre-syncope/due to dehydration  . Occasional tremors   . Diverticulitis     hx of  . Gastric ulcer     hx of  . Angina   . Atrial fibrillation   . Dysrhythmia, cardiac     "palpitations"  . Borderline diabetes   . H/O hiatal hernia   . Stroke  2000    TIA- series that have resolved-    ROS: Negative except as per HPI  BP 130/72  Pulse 76  Ht 5\' 3"  (1.6 m)  Wt 74.118 kg (163 lb 6.4 oz)  BMI 28.95 kg/m2  PHYSICAL EXAM: Pt is alert and oriented, tearful at times, but in NAD HEENT: normal Neck: JVP - normal, carotids 2+= without bruits Lungs: CTA bilaterally CV: RRR without murmur or gallop Abd: soft, NT, Positive BS, no hepatomegaly Ext: no C/C/E, distal pulses intact and equal Skin: warm/dry no rash  ASSESSMENT AND PLAN: 1. CAD - stable without anginal symptoms. Recent stress test without ischemic changes. Continue same medications.  2. HTN, essential - BP controlled on multidrug Rx. Meds and labs reviewed.  3. Hyperlipidemia - 05/03/11 labs reviewed and lipids were at goal.  4. Depression with anxiety. This is her most significant complaint at present. I asked her to follow-up with Dr Rodena Medin.  Tonny Bollman 01/14/2012

## 2012-01-15 DIAGNOSIS — M629 Disorder of muscle, unspecified: Secondary | ICD-10-CM | POA: Diagnosis not present

## 2012-01-15 DIAGNOSIS — M25669 Stiffness of unspecified knee, not elsewhere classified: Secondary | ICD-10-CM | POA: Diagnosis not present

## 2012-01-15 DIAGNOSIS — R269 Unspecified abnormalities of gait and mobility: Secondary | ICD-10-CM | POA: Diagnosis not present

## 2012-01-16 ENCOUNTER — Telehealth: Payer: Self-pay | Admitting: Internal Medicine

## 2012-01-16 NOTE — Telephone Encounter (Signed)
She was in last Friday to see Dr Rodena Medin.  She is not feeling any better and now has a UTI.  She saw an urgent care out of town and they gave her a shot of rocefin an rx for cipro.  She has finished the course of the Cipro and she feels the UTI is gone.  She still feels like she has the flu or a virus.

## 2012-01-16 NOTE — Telephone Encounter (Signed)
She saw me for depression and anxiety. Began celexa and xanax. If she is talking about mood then medication takes several weeks to help and should have close f/u scheduled.

## 2012-01-16 NOTE — Telephone Encounter (Signed)
Discuss with patient who states that she is having different concern of body ache, chills and just unable to get warm. Pt indicated that this was also mention at previous OV. Advise Pt that OV was for other concerns and if she is having new symptoms OV will be needed. Pt coming in for OV tomorrow.

## 2012-01-17 ENCOUNTER — Ambulatory Visit (HOSPITAL_BASED_OUTPATIENT_CLINIC_OR_DEPARTMENT_OTHER)
Admission: RE | Admit: 2012-01-17 | Discharge: 2012-01-17 | Disposition: A | Discharge status: Home / Self Care | Payer: Medicare Other | Source: Ambulatory Visit | Attending: Internal Medicine | Admitting: Internal Medicine

## 2012-01-17 ENCOUNTER — Encounter: Payer: Self-pay | Admitting: Internal Medicine

## 2012-01-17 ENCOUNTER — Ambulatory Visit (INDEPENDENT_AMBULATORY_CARE_PROVIDER_SITE_OTHER): Payer: Medicare Other | Admitting: Internal Medicine

## 2012-01-17 VITALS — BP 108/70 | HR 54 | Temp 97.7°F | Resp 16 | Ht 62.0 in | Wt 162.1 lb

## 2012-01-17 DIAGNOSIS — M25669 Stiffness of unspecified knee, not elsewhere classified: Secondary | ICD-10-CM | POA: Diagnosis not present

## 2012-01-17 DIAGNOSIS — M791 Myalgia, unspecified site: Secondary | ICD-10-CM

## 2012-01-17 DIAGNOSIS — Z9889 Other specified postprocedural states: Secondary | ICD-10-CM | POA: Insufficient documentation

## 2012-01-17 DIAGNOSIS — M79609 Pain in unspecified limb: Secondary | ICD-10-CM | POA: Insufficient documentation

## 2012-01-17 DIAGNOSIS — M629 Disorder of muscle, unspecified: Secondary | ICD-10-CM | POA: Diagnosis not present

## 2012-01-17 DIAGNOSIS — M7989 Other specified soft tissue disorders: Secondary | ICD-10-CM | POA: Diagnosis not present

## 2012-01-17 DIAGNOSIS — R509 Fever, unspecified: Secondary | ICD-10-CM

## 2012-01-17 DIAGNOSIS — IMO0001 Reserved for inherently not codable concepts without codable children: Secondary | ICD-10-CM | POA: Diagnosis not present

## 2012-01-17 DIAGNOSIS — R269 Unspecified abnormalities of gait and mobility: Secondary | ICD-10-CM | POA: Diagnosis not present

## 2012-01-17 LAB — BASIC METABOLIC PANEL
BUN: 20 mg/dL (ref 6–23)
CO2: 32 mEq/L (ref 19–32)
Calcium: 9.3 mg/dL (ref 8.4–10.5)
Chloride: 101 mEq/L (ref 96–112)
Creat: 1.03 mg/dL (ref 0.50–1.10)

## 2012-01-17 LAB — HEPATIC FUNCTION PANEL
ALT: 12 U/L (ref 0–35)
Bilirubin, Direct: 0.1 mg/dL (ref 0.0–0.3)
Indirect Bilirubin: 0.4 mg/dL (ref 0.0–0.9)
Total Protein: 6.6 g/dL (ref 6.0–8.3)

## 2012-01-17 LAB — CBC WITH DIFFERENTIAL/PLATELET
Basophils Absolute: 0 10*3/uL (ref 0.0–0.1)
Eosinophils Absolute: 0.3 10*3/uL (ref 0.0–0.7)
Eosinophils Relative: 6 % — ABNORMAL HIGH (ref 0–5)
HCT: 38 % (ref 36.0–46.0)
Lymphocytes Relative: 26 % (ref 12–46)
MCH: 28.5 pg (ref 26.0–34.0)
MCV: 88.2 fL (ref 78.0–100.0)
Monocytes Absolute: 0.5 10*3/uL (ref 0.1–1.0)
Platelets: 194 10*3/uL (ref 150–400)
RDW: 13.8 % (ref 11.5–15.5)

## 2012-01-17 LAB — C-REACTIVE PROTEIN: CRP: 1.02 mg/dL — ABNORMAL HIGH (ref <0.60)

## 2012-01-17 LAB — URINALYSIS, ROUTINE W REFLEX MICROSCOPIC
Hgb urine dipstick: NEGATIVE
Ketones, ur: NEGATIVE mg/dL
Leukocytes, UA: NEGATIVE
Nitrite: NEGATIVE
Urobilinogen, UA: 0.2 mg/dL (ref 0.0–1.0)

## 2012-01-17 LAB — SEDIMENTATION RATE: Sed Rate: 11 mm/hr (ref 0–22)

## 2012-01-17 NOTE — Assessment & Plan Note (Signed)
Subjective fever/chills with generalized constitutional sx's. Obtain cxr, ua with urine cx, cbc, chem7, lft. Obtain ESR and CRP- if elevated without obvious source will recommend be evaluated by orthopedics for possible infection of knee postoperatively given timing of sx's.

## 2012-01-17 NOTE — Progress Notes (Signed)
Subjective:    Patient ID: Heather [name], female    DOB: May 14, 1935, 77 y.o.   MRN: [id number]  HPI Pt presents to clinic for evaluation of multiple medical complaints. States has intermittent regular chills with possible intermittent LG fever. Notes diffuse myalgias, fatigue and malaise. Left knee is reportedly more swollen, warm and left lower leg swollen as well. States all her sx's began 3 weeks after her left knee surgery. Has orthopedic follow up in a few weeks. Recently tx'ed at outside clinic for UTI and given rocephin.   Past Medical History  Diagnosis Date  . Coronary artery disease     post percutaneous coronary intervention in 2006  . Hyperlipidemia   . Restless legs     takes mirapex daily  . Fatigue   . Persistent disorder of initiating or maintaining sleep   . Dermatitis   . S/P inguinal herniorrhaphy   . DJD (degenerative joint disease) of knee 09/18/2011    knees- both, elbow- R, ankles   . Hypertension     takes medications daily  . GERD (gastroesophageal reflux disease)     prilosec daily  . H/O dizziness     pre-syncope/due to dehydration  . Occasional tremors   . Diverticulitis     hx of  . Gastric ulcer     hx of  . Angina   . Atrial fibrillation   . Dysrhythmia, cardiac     "palpitations"  . Borderline diabetes   . H/O hiatal hernia   . Stroke 2000    TIA- series that have resolved-  . Hx of total knee replacement 11/12/11    left knee   Past Surgical History  Procedure Date  . Abdominal hysterectomy   . Knee arthroscopy     2 on left and 1 on right  . Foot surgery     left foot stress fracture repair  . Hernia repair     1989- esophageal hernia   . Upper gastrointestinal endoscopy 2013  . Total knee arthroplasty 11/12/2011    Procedure: TOTAL KNEE ARTHROPLASTY;  Surgeon: Nilda Simmer, MD;  Location: [facility] OR;  Service: Orthopedics;  Laterality: Left;  Dr Thurston Hole wants 90 minutes for this case  . Brain surgery   . Coronary angioplasty 03/2005;  06/2005    "1" plus "1"= "2 total"    reports that she has never smoked. She has never used smokeless tobacco. She reports that she does not drink alcohol or use illicit drugs. family history includes Arthritis in an unspecified family member; Cancer in her brothers; Coronary artery disease in her mother; Heart disease in her mother; Hypertension in an unspecified family member; Other in an unspecified family member; and Stroke in her brother and father.  There is no history of Breast cancer, and Colon cancer, and Anesthesia problems, and Hypotension, and Malignant hyperthermia, and Pseudochol deficiency, . No Known Allergies   Review of Systems see hpi     Objective:   Physical Exam  Nursing note and vitals reviewed. Constitutional: She appears well-developed and well-nourished. No distress.  HENT:  Head: Normocephalic and atraumatic.  Right Ear: External ear normal.  Left Ear: External ear normal.  Eyes: Conjunctivae are normal. Right eye exhibits no discharge. Left eye exhibits no discharge. No scleral icterus.  Neck: Neck supple. No JVD present.  Cardiovascular: Normal rate, regular rhythm and normal heart sounds.  Exam reveals no gallop and no friction rub.   No murmur heard. Pulmonary/Chest: Effort normal and breath  sounds normal. No respiratory distress. She has no wheezes. She has no rales.  Musculoskeletal:       Left lower leg diffusely swollen. Left knee mildly swollen with mild warmth to touch.   Neurological: She is alert.  Skin: Skin is warm and dry. She is not diaphoretic.  Psychiatric: She has a normal mood and affect.          Assessment & Plan:

## 2012-01-17 NOTE — Assessment & Plan Note (Signed)
Obtain LE [place] r/o DVT.

## 2012-01-21 ENCOUNTER — Other Ambulatory Visit: Payer: Self-pay | Admitting: Cardiovascular Disease

## 2012-01-22 DIAGNOSIS — M25669 Stiffness of unspecified knee, not elsewhere classified: Secondary | ICD-10-CM | POA: Diagnosis not present

## 2012-01-22 DIAGNOSIS — M629 Disorder of muscle, unspecified: Secondary | ICD-10-CM | POA: Diagnosis not present

## 2012-01-22 DIAGNOSIS — R269 Unspecified abnormalities of gait and mobility: Secondary | ICD-10-CM | POA: Diagnosis not present

## 2012-01-23 ENCOUNTER — Encounter: Payer: Self-pay | Admitting: Internal Medicine

## 2012-01-23 ENCOUNTER — Ambulatory Visit (INDEPENDENT_AMBULATORY_CARE_PROVIDER_SITE_OTHER): Payer: Medicare Other | Admitting: Internal Medicine

## 2012-01-23 VITALS — BP 115/64 | HR 55 | Temp 97.6°F | Resp 14 | Wt 159.0 lb

## 2012-01-23 DIAGNOSIS — F411 Generalized anxiety disorder: Secondary | ICD-10-CM

## 2012-01-23 DIAGNOSIS — F419 Anxiety disorder, unspecified: Secondary | ICD-10-CM

## 2012-01-23 MED ORDER — ALPRAZOLAM 0.25 MG PO TABS: 0.2500 mg | ORAL_TABLET | Freq: Every day | ORAL | Status: AC | PRN

## 2012-01-24 ENCOUNTER — Ambulatory Visit: Payer: Medicare Other | Admitting: Internal Medicine

## 2012-01-24 DIAGNOSIS — M25669 Stiffness of unspecified knee, not elsewhere classified: Secondary | ICD-10-CM | POA: Diagnosis not present

## 2012-01-24 DIAGNOSIS — R269 Unspecified abnormalities of gait and mobility: Secondary | ICD-10-CM | POA: Diagnosis not present

## 2012-01-24 DIAGNOSIS — M629 Disorder of muscle, unspecified: Secondary | ICD-10-CM | POA: Diagnosis not present

## 2012-01-26 NOTE — Progress Notes (Signed)
  Subjective:    Patient ID: Heather [name], female    DOB: 08-Jun-1935, 77 y.o.   MRN: [id number]  HPI Pt presents to clinic for follow up of fatigue. Notes 85% better. In retrospect believes recent sx's were side effects of narcotic medication used post op after knee surgery. Has stopped all narcotics and demonstrates no evidence of withhdrawal. Trying to wean off xanax as well. Recently evaluated by orthopedics and not felt to have evidence of infection in knee.   Past Medical History  Diagnosis Date  . Coronary artery disease     post percutaneous coronary intervention in 2006  . Hyperlipidemia   . Restless legs     takes mirapex daily  . Fatigue   . Persistent disorder of initiating or maintaining sleep   . Dermatitis   . S/P inguinal herniorrhaphy   . DJD (degenerative joint disease) of knee 09/18/2011    knees- both, elbow- R, ankles   . Hypertension     takes medications daily  . GERD (gastroesophageal reflux disease)     prilosec daily  . H/O dizziness     pre-syncope/due to dehydration  . Occasional tremors   . Diverticulitis     hx of  . Gastric ulcer     hx of  . Angina   . Atrial fibrillation   . Dysrhythmia, cardiac     "palpitations"  . Borderline diabetes   . H/O hiatal hernia   . Stroke 2000    TIA- series that have resolved-  . Hx of total knee replacement 11/12/11    left knee   Past Surgical History  Procedure Date  . Abdominal hysterectomy   . Knee arthroscopy     2 on left and 1 on right  . Foot surgery     left foot stress fracture repair  . Hernia repair     1989- esophageal hernia   . Upper gastrointestinal endoscopy 2013  . Total knee arthroplasty 11/12/2011    Procedure: TOTAL KNEE ARTHROPLASTY;  Surgeon: Nilda Simmer, MD;  Location: [facility] OR;  Service: Orthopedics;  Laterality: Left;  Dr Thurston Hole wants 90 minutes for this case  . Brain surgery   . Coronary angioplasty 03/2005; 06/2005    "1" plus "1"= "2 total"    reports that she has never  smoked. She has never used smokeless tobacco. She reports that she does not drink alcohol or use illicit drugs. family history includes Arthritis in an unspecified family member; Cancer in her brothers; Coronary artery disease in her mother; Heart disease in her mother; Hypertension in an unspecified family member; Other in an unspecified family member; and Stroke in her brother and father.  There is no history of Breast cancer, and Colon cancer, and Anesthesia problems, and Hypotension, and Malignant hyperthermia, and Pseudochol deficiency, . No Known Allergies   Review of Systems see hpi     Objective:   Physical Exam  Nursing note and vitals reviewed. Constitutional: She appears well-developed and well-nourished. No distress.  HENT:  Head: Normocephalic and atraumatic.  Neurological: She is alert.  Skin: She is not diaphoretic.  Psychiatric: She has a normal mood and affect. Her behavior is normal.          Assessment & Plan:

## 2012-01-26 NOTE — Assessment & Plan Note (Signed)
Multitude of sx's now attributed to recent narcotic use. Wean xanax and celexa off. (weaning instructions provided). Followup if no improvement or worsening.

## 2012-01-28 ENCOUNTER — Telehealth: Payer: Self-pay | Admitting: Cardiovascular Disease

## 2012-01-28 MED ORDER — POTASSIUM CHLORIDE CRYS ER 20 MEQ PO TBCR: 20.0000 meq | EXTENDED_RELEASE_TABLET | Freq: Every day | ORAL | Status: DC

## 2012-01-28 NOTE — Telephone Encounter (Signed)
Needs refill of khor-con, uses [facility] [place], need pharmacy on file change to this one

## 2012-01-29 DIAGNOSIS — M25669 Stiffness of unspecified knee, not elsewhere classified: Secondary | ICD-10-CM | POA: Diagnosis not present

## 2012-01-29 DIAGNOSIS — R269 Unspecified abnormalities of gait and mobility: Secondary | ICD-10-CM | POA: Diagnosis not present

## 2012-01-29 DIAGNOSIS — M629 Disorder of muscle, unspecified: Secondary | ICD-10-CM | POA: Diagnosis not present

## 2012-01-31 DIAGNOSIS — M629 Disorder of muscle, unspecified: Secondary | ICD-10-CM | POA: Diagnosis not present

## 2012-01-31 DIAGNOSIS — M25669 Stiffness of unspecified knee, not elsewhere classified: Secondary | ICD-10-CM | POA: Diagnosis not present

## 2012-01-31 DIAGNOSIS — R269 Unspecified abnormalities of gait and mobility: Secondary | ICD-10-CM | POA: Diagnosis not present

## 2012-02-12 DIAGNOSIS — R269 Unspecified abnormalities of gait and mobility: Secondary | ICD-10-CM | POA: Diagnosis not present

## 2012-02-12 DIAGNOSIS — M629 Disorder of muscle, unspecified: Secondary | ICD-10-CM | POA: Diagnosis not present

## 2012-02-12 DIAGNOSIS — M25669 Stiffness of unspecified knee, not elsewhere classified: Secondary | ICD-10-CM | POA: Diagnosis not present

## 2012-02-14 DIAGNOSIS — M25669 Stiffness of unspecified knee, not elsewhere classified: Secondary | ICD-10-CM | POA: Diagnosis not present

## 2012-02-14 DIAGNOSIS — M629 Disorder of muscle, unspecified: Secondary | ICD-10-CM | POA: Diagnosis not present

## 2012-02-14 DIAGNOSIS — R269 Unspecified abnormalities of gait and mobility: Secondary | ICD-10-CM | POA: Diagnosis not present

## 2012-02-18 DIAGNOSIS — M25669 Stiffness of unspecified knee, not elsewhere classified: Secondary | ICD-10-CM | POA: Diagnosis not present

## 2012-02-18 DIAGNOSIS — R269 Unspecified abnormalities of gait and mobility: Secondary | ICD-10-CM | POA: Diagnosis not present

## 2012-02-18 DIAGNOSIS — M629 Disorder of muscle, unspecified: Secondary | ICD-10-CM | POA: Diagnosis not present

## 2012-02-19 DIAGNOSIS — M1710 Unilateral primary osteoarthritis, unspecified knee: Secondary | ICD-10-CM | POA: Diagnosis not present

## 2012-02-20 ENCOUNTER — Ambulatory Visit: Payer: Medicare Other | Admitting: Internal Medicine

## 2012-02-20 DIAGNOSIS — M629 Disorder of muscle, unspecified: Secondary | ICD-10-CM | POA: Diagnosis not present

## 2012-02-20 DIAGNOSIS — M25669 Stiffness of unspecified knee, not elsewhere classified: Secondary | ICD-10-CM | POA: Diagnosis not present

## 2012-02-20 DIAGNOSIS — R269 Unspecified abnormalities of gait and mobility: Secondary | ICD-10-CM | POA: Diagnosis not present

## 2012-02-26 DIAGNOSIS — M25669 Stiffness of unspecified knee, not elsewhere classified: Secondary | ICD-10-CM | POA: Diagnosis not present

## 2012-02-26 DIAGNOSIS — R269 Unspecified abnormalities of gait and mobility: Secondary | ICD-10-CM | POA: Diagnosis not present

## 2012-02-26 DIAGNOSIS — M629 Disorder of muscle, unspecified: Secondary | ICD-10-CM | POA: Diagnosis not present

## 2012-02-28 DIAGNOSIS — R269 Unspecified abnormalities of gait and mobility: Secondary | ICD-10-CM | POA: Diagnosis not present

## 2012-02-28 DIAGNOSIS — M25669 Stiffness of unspecified knee, not elsewhere classified: Secondary | ICD-10-CM | POA: Diagnosis not present

## 2012-02-28 DIAGNOSIS — M629 Disorder of muscle, unspecified: Secondary | ICD-10-CM | POA: Diagnosis not present

## 2012-03-04 DIAGNOSIS — M629 Disorder of muscle, unspecified: Secondary | ICD-10-CM | POA: Diagnosis not present

## 2012-03-04 DIAGNOSIS — M25669 Stiffness of unspecified knee, not elsewhere classified: Secondary | ICD-10-CM | POA: Diagnosis not present

## 2012-03-04 DIAGNOSIS — R269 Unspecified abnormalities of gait and mobility: Secondary | ICD-10-CM | POA: Diagnosis not present

## 2012-03-06 DIAGNOSIS — M25669 Stiffness of unspecified knee, not elsewhere classified: Secondary | ICD-10-CM | POA: Diagnosis not present

## 2012-03-06 DIAGNOSIS — R269 Unspecified abnormalities of gait and mobility: Secondary | ICD-10-CM | POA: Diagnosis not present

## 2012-03-06 DIAGNOSIS — M629 Disorder of muscle, unspecified: Secondary | ICD-10-CM | POA: Diagnosis not present

## 2012-03-18 DIAGNOSIS — Z471 Aftercare following joint replacement surgery: Secondary | ICD-10-CM | POA: Diagnosis not present

## 2012-03-18 DIAGNOSIS — IMO0002 Reserved for concepts with insufficient information to code with codable children: Secondary | ICD-10-CM | POA: Diagnosis not present

## 2012-03-18 DIAGNOSIS — M1710 Unilateral primary osteoarthritis, unspecified knee: Secondary | ICD-10-CM | POA: Diagnosis not present

## 2012-03-26 ENCOUNTER — Other Ambulatory Visit: Payer: Self-pay | Admitting: Internal Medicine

## 2012-03-27 NOTE — Telephone Encounter (Signed)
Mirapex request [Last Rx 04.24.13 #[id number]]/SLS Please advise.

## 2012-03-28 ENCOUNTER — Telehealth: Payer: Self-pay | Admitting: Internal Medicine

## 2012-03-28 NOTE — Telephone Encounter (Signed)
Rx done/SLS 

## 2012-03-28 NOTE — Telephone Encounter (Signed)
OK #60 with 2 refills pls.

## 2012-03-28 NOTE — Telephone Encounter (Signed)
Duplicate request--Done 09.20.12/SLS

## 2012-04-15 DIAGNOSIS — Z471 Aftercare following joint replacement surgery: Secondary | ICD-10-CM | POA: Diagnosis not present

## 2012-05-16 ENCOUNTER — Other Ambulatory Visit: Payer: Self-pay | Admitting: Cardiovascular Disease

## 2012-05-16 MED ORDER — LOSARTAN POTASSIUM-HCTZ 100-25 MG PO TABS: 1.0000 | ORAL_TABLET | ORAL | Status: DC

## 2012-05-29 ENCOUNTER — Other Ambulatory Visit: Payer: Self-pay | Admitting: Gynecology

## 2012-05-29 DIAGNOSIS — Z1231 Encounter for screening mammogram for malignant neoplasm of breast: Secondary | ICD-10-CM

## 2012-06-24 ENCOUNTER — Other Ambulatory Visit: Payer: Self-pay | Admitting: Family

## 2012-06-24 NOTE — Telephone Encounter (Signed)
Ok for same rf

## 2012-06-24 NOTE — Telephone Encounter (Signed)
Rx to pharmacy/SLS 

## 2012-06-24 NOTE — Telephone Encounter (Signed)
Mirapex request [Last Rx 09.18.13 #60x2]/SLS Please advise.

## 2012-07-04 ENCOUNTER — Encounter: Payer: Self-pay | Admitting: Internal Medicine

## 2012-07-08 ENCOUNTER — Ambulatory Visit
Admission: RE | Admit: 2012-07-08 | Discharge: 2012-07-08 | Disposition: A | Discharge status: Home / Self Care | Payer: Medicare Other | Source: Ambulatory Visit | Attending: Gynecology | Admitting: Gynecology

## 2012-07-08 DIAGNOSIS — Z1231 Encounter for screening mammogram for malignant neoplasm of breast: Secondary | ICD-10-CM | POA: Diagnosis not present

## 2012-07-08 DIAGNOSIS — Z23 Encounter for immunization: Secondary | ICD-10-CM | POA: Diagnosis not present

## 2012-07-11 NOTE — Telephone Encounter (Signed)
Pharmacy states that Medicare had changed & pt needs to bring in updated Insurance information; pt reports that pharmacy did fill medication/SLS

## 2012-07-14 ENCOUNTER — Telehealth: Payer: Self-pay | Admitting: Internal Medicine

## 2012-07-14 MED ORDER — AMLODIPINE BESYLATE 10 MG PO TABS: 10.0000 mg | ORAL_TABLET | ORAL | Status: DC

## 2012-07-14 NOTE — Telephone Encounter (Signed)
Refill- amlodipine 10mg  tab. Take one tablet by mouth in the morning. Qty 90 last fill 10.7.13

## 2012-07-14 NOTE — Telephone Encounter (Signed)
Rx to pharmacy/SLS 

## 2012-08-23 ENCOUNTER — Other Ambulatory Visit: Payer: Self-pay

## 2012-09-01 ENCOUNTER — Other Ambulatory Visit: Payer: Self-pay | Admitting: Internal Medicine

## 2012-09-01 ENCOUNTER — Other Ambulatory Visit: Payer: Self-pay | Admitting: Gynecology

## 2012-09-01 ENCOUNTER — Other Ambulatory Visit: Payer: Self-pay | Admitting: *Deleted

## 2012-09-01 DIAGNOSIS — Z9189 Other specified personal risk factors, not elsewhere classified: Secondary | ICD-10-CM | POA: Diagnosis not present

## 2012-09-01 DIAGNOSIS — Z01419 Encounter for gynecological examination (general) (routine) without abnormal findings: Secondary | ICD-10-CM | POA: Diagnosis not present

## 2012-09-01 DIAGNOSIS — R350 Frequency of micturition: Secondary | ICD-10-CM | POA: Diagnosis not present

## 2012-09-01 MED ORDER — PRAMIPEXOLE DIHYDROCHLORIDE 0.25 MG PO TABS: 0.2500 mg | ORAL_TABLET | Freq: Two times a day (BID) | ORAL | Status: DC

## 2012-09-01 NOTE — Telephone Encounter (Signed)
I went to refill Pramipexole and this message came up:  Significance: Exceeds Frequency  Ordered frequency: 2 times daily Recommended frequency: 1 doses/day Exceeds maximum by: 1 doses/day   Please advise refill?

## 2012-09-01 NOTE — Telephone Encounter (Signed)
Refill- pramipexole 0.25mg  tab. Take one tablet by mouth twice daily. Qty 60 last fill 1.6.14

## 2012-09-16 DIAGNOSIS — D485 Neoplasm of uncertain behavior of skin: Secondary | ICD-10-CM | POA: Diagnosis not present

## 2012-09-26 ENCOUNTER — Other Ambulatory Visit: Payer: Self-pay | Admitting: Cardiovascular Disease

## 2012-10-16 DIAGNOSIS — M1710 Unilateral primary osteoarthritis, unspecified knee: Secondary | ICD-10-CM | POA: Diagnosis not present

## 2012-10-16 DIAGNOSIS — IMO0002 Reserved for concepts with insufficient information to code with codable children: Secondary | ICD-10-CM | POA: Diagnosis not present

## 2012-11-04 ENCOUNTER — Other Ambulatory Visit: Payer: Self-pay | Admitting: Cardiovascular Disease

## 2012-11-18 DIAGNOSIS — H2510 Age-related nuclear cataract, unspecified eye: Secondary | ICD-10-CM | POA: Diagnosis not present

## 2012-11-25 ENCOUNTER — Other Ambulatory Visit: Payer: Self-pay | Admitting: Internal Medicine

## 2012-11-25 MED ORDER — PRAMIPEXOLE DIHYDROCHLORIDE 0.25 MG PO TABS: 0.2500 mg | ORAL_TABLET | Freq: Two times a day (BID) | ORAL | Status: DC

## 2012-11-25 NOTE — Telephone Encounter (Signed)
Please advise refill? 

## 2012-11-25 NOTE — Telephone Encounter (Signed)
Refill- pramipexole 0.25mg  tab. Take one tablet by mouth twice daily. Qty 60 last fill 1.6.14

## 2012-12-16 ENCOUNTER — Ambulatory Visit: Payer: Medicare Other | Admitting: Cardiovascular Disease

## 2013-01-15 ENCOUNTER — Other Ambulatory Visit: Payer: Self-pay | Admitting: *Deleted

## 2013-01-15 MED ORDER — AMLODIPINE BESYLATE 10 MG PO TABS: 10.0000 mg | ORAL_TABLET | ORAL | Status: DC

## 2013-01-15 NOTE — Telephone Encounter (Signed)
Rx request to pharmacy, 30-day only [last OV 07.17.13];  **Office Visit Needed Prior to Future Refills**- PLEASE ADVISE PATIENT/SLS

## 2013-01-16 ENCOUNTER — Other Ambulatory Visit: Payer: Self-pay | Admitting: Cardiovascular Disease

## 2013-01-28 ENCOUNTER — Other Ambulatory Visit: Payer: Self-pay | Admitting: Cardiology

## 2013-02-04 ENCOUNTER — Ambulatory Visit (INDEPENDENT_AMBULATORY_CARE_PROVIDER_SITE_OTHER): Payer: Medicare Other | Admitting: Cardiovascular Disease

## 2013-02-04 ENCOUNTER — Encounter: Payer: Self-pay | Admitting: Cardiovascular Disease

## 2013-02-04 VITALS — BP 120/60 | HR 50 | Ht 63.0 in | Wt 166.0 lb

## 2013-02-04 DIAGNOSIS — I10 Essential (primary) hypertension: Secondary | ICD-10-CM | POA: Diagnosis not present

## 2013-02-04 DIAGNOSIS — I2510 Atherosclerotic heart disease of native coronary artery without angina pectoris: Secondary | ICD-10-CM

## 2013-02-04 DIAGNOSIS — E785 Hyperlipidemia, unspecified: Secondary | ICD-10-CM | POA: Diagnosis not present

## 2013-02-04 NOTE — Progress Notes (Signed)
HPI:  78 year old woman presenting for followup evaluation. She was last seen in June 2013. She is followed for coronary artery disease and underwent multivessel stenting in 2006. She's had no problems with recurrent angina since that time. She specifically denies chest pain, chest pressure, orthopnea, PND, or palpitations. She does have chronic exertional dyspnea.  Her primary problem at this time relates to generalized fatigue and weakness. After she takes her antihypertensive medications in the morning she feels very bad for a few hours. She complains of weakness and dizziness. Many days she has to lay on the couch for a few hours. Blood pressures have been as low as 80s over 50s. She generally feels much better in the afternoon.  Outpatient Encounter Prescriptions as of 02/04/2013  Medication Sig Dispense Refill  . amLODipine (NORVASC) 10 MG tablet Take 0.5 tablets (5 mg total) by mouth every morning.  30 tablet  0  . aspirin 81 MG tablet Take 81 mg by mouth daily.      . calcium-vitamin D (OSCAL WITH D) 500-200 MG-UNIT per tablet Take 1 tablet by mouth 2 (two) times daily.      . isosorbide mononitrate (IMDUR) 60 MG 24 hr tablet TAKE ONE-HALF TABLET BY MOUTH EVERY DAY  30 tablet  3  . losartan-hydrochlorothiazide (HYZAAR) 100-25 MG per tablet TAKE ONE TABLET BY MOUTH EVERY DAY  90 tablet  1  . magnesium oxide (MAG-OX) 400 MG tablet Take 400 mg by mouth 2 (two) times daily.       . metoprolol succinate (TOPROL-XL) 100 MG 24 hr tablet Take one-half tablet by mouth twice a day. Take with or immediately following a meal.  180 tablet  0  . omeprazole (PRILOSEC) 40 MG capsule Take 40 mg by mouth 2 (two) times daily.       . potassium chloride SA (K-DUR,KLOR-CON) 20 MEQ tablet Take 1 tablet (20 mEq total) by mouth daily.  30 tablet  12  . pramipexole (MIRAPEX) 0.25 MG tablet Take 1 tablet (0.25 mg total) by mouth 2 (two) times daily.  60 tablet  2  . simvastatin (ZOCOR) 20 MG tablet TAKE ONE TABLET  BY MOUTH AT BEDTIME  30 tablet  6  . [DISCONTINUED] amLODipine (NORVASC) 10 MG tablet Take 1 tablet (10 mg total) by mouth every morning.  30 tablet  0  . [DISCONTINUED] metoprolol succinate (TOPROL-XL) 100 MG 24 hr tablet TAKE ONE TABLET BY MOUTH TWICE DAILY  180 tablet  0  . [DISCONTINUED] citalopram (CELEXA) 20 MG tablet Take 1 tablet (20 mg total) by mouth daily.  30 tablet  3  . [DISCONTINUED] diazepam (VALIUM) 2 MG tablet Take 2 mg by mouth every 8 (eight) hours as needed.      . [DISCONTINUED] oxyCODONE-acetaminophen (PERCOCET) 5-325 MG per tablet Take 2 tablets by mouth every 6 (six) hours as needed.        No facility-administered encounter medications on file as of 02/04/2013.    No Known Allergies  Past Medical History  Diagnosis Date  . Coronary artery disease     post percutaneous coronary intervention in 2006  . Hyperlipidemia   . Restless legs     takes mirapex daily  . Fatigue   . Persistent disorder of initiating or maintaining sleep   . Dermatitis   . S/P inguinal herniorrhaphy   . DJD (degenerative joint disease) of knee 09/18/2011    knees- both, elbow- R, ankles   . Hypertension     takes medications  daily  . GERD (gastroesophageal reflux disease)     prilosec daily  . H/O dizziness     pre-syncope/due to dehydration  . Occasional tremors   . Diverticulitis     hx of  . Gastric ulcer     hx of  . Angina   . Atrial fibrillation   . Dysrhythmia, cardiac     "palpitations"  . Borderline diabetes   . H/O hiatal hernia   . Stroke 2000    TIA- series that have resolved-  . Hx of total knee replacement 11/12/11    left knee   ROS: Positive for knee pain, otherwise negative except as per HPI  BP 120/60  Pulse 50  Ht 5\' 3"  (1.6 m)  Wt 166 lb (75.297 kg)  BMI 29.41 kg/m2  PHYSICAL EXAM: Pt is alert and oriented, NAD HEENT: normal Neck: JVP - normal, carotids 2+= without bruits Lungs: CTA bilaterally CV: RRR without murmur or gallop Abd: soft, NT,  Positive BS, no hepatomegaly Ext: no C/C/E, distal pulses intact and equal Skin: warm/dry no rash  EKG:  Sinus bradycardia 50 beats per minute, left ventricular hypertrophy, otherwise within normal limits.  ASSESSMENT AND PLAN: 1. Coronary artery disease, native vessel. The patient is stable without anginal symptoms. She will continue on her current medical program without changes. She is on appropriate secondary risk reduction therapies. She's also on aggressive antianginal program of isosorbide, amlodipine, and metoprolol. See below for medication changes.  2. Essential hypertension, now with episodic hypotension and weakness probably related to antihypertensive medical therapy. I have recommended that she decrease amlodipine 5 mg daily and decrease metoprolol succinate 50 mg twice daily. She will record blood pressures and bring them in to a nurse visit for repeat blood pressure in 2 weeks.  3. Hyperlipidemia. She is on simvastatin. Lipids and LFTs need to be rechecked. I'm going to see if they've been followed elsewhere because her last lipid panel here was in October 2012.  Tonny Bollman 02/04/2013 1:25 PM

## 2013-02-04 NOTE — Patient Instructions (Addendum)
Your physician has recommended you make the following change in your medication: DECREASE Amlodipine to 5mg  daily, DECREASE Metoprolol Succinate to 50mg  twice a day  Your physician has requested that you regularly monitor and record your blood pressure readings at home. Please use the same machine at the same time of day to check your readings and record them to bring to your follow-up visit.  Your physician recommends that you schedule a follow-up appointment in: 2 WEEKS for BP check  Your physician recommends that you return for a FASTING lipid and liver profile--nothing to eat or drink after midnight  Your physician wants you to follow-up in: 1 YEAR with Dr Excell Seltzer.  You will receive a reminder letter in the mail two months in advance. If you don't receive a letter, please call our office to schedule the follow-up appointment.

## 2013-02-11 ENCOUNTER — Other Ambulatory Visit: Payer: Self-pay

## 2013-02-16 ENCOUNTER — Other Ambulatory Visit: Payer: Self-pay | Admitting: Cardiovascular Disease

## 2013-02-17 ENCOUNTER — Other Ambulatory Visit: Payer: Self-pay | Admitting: *Deleted

## 2013-02-17 DIAGNOSIS — I10 Essential (primary) hypertension: Secondary | ICD-10-CM

## 2013-02-17 DIAGNOSIS — E785 Hyperlipidemia, unspecified: Secondary | ICD-10-CM

## 2013-02-17 DIAGNOSIS — I2510 Atherosclerotic heart disease of native coronary artery without angina pectoris: Secondary | ICD-10-CM

## 2013-02-17 MED ORDER — AMLODIPINE BESYLATE 10 MG PO TABS: 10.0000 mg | ORAL_TABLET | ORAL | Status: DC

## 2013-02-17 NOTE — Telephone Encounter (Signed)
Patient Needs Appointment Prior to Future Refills; 15-day given as this message was relayed to pt last month w/30-day supply & no appointment made, last OV was 07.17.2013 [TWH]/SLS

## 2013-02-18 ENCOUNTER — Ambulatory Visit (INDEPENDENT_AMBULATORY_CARE_PROVIDER_SITE_OTHER): Payer: Medicare Other | Admitting: *Deleted

## 2013-02-18 ENCOUNTER — Other Ambulatory Visit (INDEPENDENT_AMBULATORY_CARE_PROVIDER_SITE_OTHER): Payer: Medicare Other

## 2013-02-18 VITALS — BP 114/62 | HR 62 | Ht 63.0 in | Wt 165.5 lb

## 2013-02-18 DIAGNOSIS — I10 Essential (primary) hypertension: Secondary | ICD-10-CM

## 2013-02-18 DIAGNOSIS — I2510 Atherosclerotic heart disease of native coronary artery without angina pectoris: Secondary | ICD-10-CM | POA: Diagnosis not present

## 2013-02-18 DIAGNOSIS — E785 Hyperlipidemia, unspecified: Secondary | ICD-10-CM

## 2013-02-18 LAB — HEPATIC FUNCTION PANEL
Albumin: 4.1 g/dL (ref 3.5–5.2)
Alkaline Phosphatase: 84 U/L (ref 39–117)
Total Protein: 6.8 g/dL (ref 6.0–8.3)

## 2013-02-18 LAB — LIPID PANEL
Cholesterol: 146 mg/dL (ref 0–200)
HDL: 47.7 mg/dL (ref >39.00)
LDL Cholesterol: 69 mg/dL (ref 0–99)
Triglycerides: 145 mg/dL (ref 0.0–149.0)
VLDL: 29 mg/dL (ref 0.0–40.0)

## 2013-02-18 NOTE — Progress Notes (Signed)
Pt in for B/P check. Amlodipine and metoprolol medication dose was decreased per Dr Excell Seltzer on 02/04/13, because pt was having episodes of low B/P. BP today right arm 114/62 left arm 108/58 pulse 62 beats/ minute regular. Pt took her BP medication this AM. Pt states feels a lot better, and has more energy. Dr Excell Seltzer aware states that pt's BP readings look good, and recommended for pt to continue same medication regimen as planned. Pt aware she verbalized understanding.

## 2013-03-12 DIAGNOSIS — IMO0002 Reserved for concepts with insufficient information to code with codable children: Secondary | ICD-10-CM | POA: Diagnosis not present

## 2013-03-12 DIAGNOSIS — M1710 Unilateral primary osteoarthritis, unspecified knee: Secondary | ICD-10-CM | POA: Diagnosis not present

## 2013-03-30 ENCOUNTER — Other Ambulatory Visit: Payer: Self-pay | Admitting: *Deleted

## 2013-03-30 NOTE — Telephone Encounter (Signed)
Requested drug refills are denied; Pt Not been seen in office >1 year & no longer under prescriber care [Dr. Osvaldo Shipper supply given on amlodipine in August '14] w/instructions to patient that Office Visit Needed Prior to Future Refills; no appointment scheduled/SLS

## 2013-04-01 ENCOUNTER — Ambulatory Visit (INDEPENDENT_AMBULATORY_CARE_PROVIDER_SITE_OTHER): Payer: Medicare Other | Admitting: Physician Assistant

## 2013-04-01 ENCOUNTER — Ambulatory Visit (HOSPITAL_BASED_OUTPATIENT_CLINIC_OR_DEPARTMENT_OTHER)
Admission: RE | Admit: 2013-04-01 | Discharge: 2013-04-01 | Disposition: A | Discharge status: Home / Self Care | Payer: Medicare Other | Source: Ambulatory Visit | Attending: Physician Assistant | Admitting: Physician Assistant

## 2013-04-01 ENCOUNTER — Encounter: Payer: Self-pay | Admitting: Physician Assistant

## 2013-04-01 ENCOUNTER — Telehealth: Payer: Self-pay | Admitting: Internal Medicine

## 2013-04-01 VITALS — BP 120/82 | HR 57 | Temp 97.7°F | Resp 14 | Ht 62.0 in | Wt 168.2 lb

## 2013-04-01 DIAGNOSIS — H53469 Homonymous bilateral field defects, unspecified side: Secondary | ICD-10-CM | POA: Insufficient documentation

## 2013-04-01 DIAGNOSIS — H5347 Heteronymous bilateral field defects: Secondary | ICD-10-CM

## 2013-04-01 DIAGNOSIS — G2581 Restless legs syndrome: Secondary | ICD-10-CM

## 2013-04-01 DIAGNOSIS — R51 Headache: Secondary | ICD-10-CM | POA: Diagnosis not present

## 2013-04-01 DIAGNOSIS — I2510 Atherosclerotic heart disease of native coronary artery without angina pectoris: Secondary | ICD-10-CM

## 2013-04-01 DIAGNOSIS — H5711 Ocular pain, right eye: Secondary | ICD-10-CM

## 2013-04-01 DIAGNOSIS — E785 Hyperlipidemia, unspecified: Secondary | ICD-10-CM

## 2013-04-01 DIAGNOSIS — I10 Essential (primary) hypertension: Secondary | ICD-10-CM | POA: Diagnosis not present

## 2013-04-01 DIAGNOSIS — H539 Unspecified visual disturbance: Secondary | ICD-10-CM | POA: Diagnosis not present

## 2013-04-01 DIAGNOSIS — H5710 Ocular pain, unspecified eye: Secondary | ICD-10-CM

## 2013-04-01 MED ORDER — AMLODIPINE BESYLATE 10 MG PO TABS: 10.0000 mg | ORAL_TABLET | ORAL | Status: DC

## 2013-04-01 MED ORDER — PRAMIPEXOLE DIHYDROCHLORIDE 0.25 MG PO TABS: 0.2500 mg | ORAL_TABLET | Freq: Two times a day (BID) | ORAL | Status: DC

## 2013-04-01 MED ORDER — AMLODIPINE BESYLATE 10 MG PO TABS: 5.0000 mg | ORAL_TABLET | ORAL | Status: DC

## 2013-04-01 NOTE — Telephone Encounter (Signed)
Patient Information:  Caller Name: Kristalynn  Phone: [phone]  Patient: Heather [name], Heather [name]  Gender: Female  DOB: 05-10-1935  Age: 78 Years  PCP: Marguarite Arbour (Adults only)  Office Follow Up:  Does the office need to follow up with this patient?: No  Instructions For The Office: N/A  RN Note:  Cold symptoms present with congestion for past 2 weeks. Right eye more droopy or swollen than normal but able to open it. Episode of blurred vison right eye resolved.  Able to move facial muscles.  Aware of TIA symptoms; denies numbness. May try warm compress to painful area.  Symptoms  Reason For Call & Symptoms: Recurrent painful sore spot (to touch) above right eye or at times on upper forehead.  Pain moves; lasts approximately 1 minute and does not feel like a headache.  Noted blurred vision 03/31/13 for about 5 minutes.  Last episode at 2100 03/31/13.  Reviewed Health History In EMR: Yes  Reviewed Medications In EMR: Yes  Reviewed Allergies In EMR: Yes  Reviewed Surgeries / Procedures: Yes  Date of Onset of Symptoms: 03/30/2013  Treatments Tried: nasal saline  Treatments Tried Worked: Yes  Guideline(s) Used:  Face Pain  Sinus Pain and Congestion  Disposition Per Guideline:   Go to Office Now  Reason For Disposition Reached:   Redness or swelling on the cheek, forehead, or around the eye  Advice Given:  Reassurance:   Sinus congestion is a normal part of a cold.  Usually home treatment with nasal washes can prevent an actual bacterial sinus infection.  Antibiotics are not helpful for the sinus congestion that occurs with colds.  Hydration:  Drink plenty of liquids (6-8 glasses of water daily). If the air in your home is dry, use a cool mist humidifier  Expected Course:  Sinus congestion from viral upper respiratory infections (colds) usually lasts 5-10 days.  Occasionally a cold can worsen and turn into bacterial sinusitis. Clues to this are sinus symptoms lasting longer than 10 days,  fever lasting longer than 3 days, and worsening pain. Bacterial sinusitis may need antibiotic treatment.  Call Back If:   Sinus congestion (fullness) lasts longer than 10 days  Fever lasts longer than 3 days  You become worse.  Patient Will Follow Care Advice:  YES  Appointment Scheduled:  04/01/2013 10:15:00 Appointment Scheduled Provider:  Marcelline Mates "Selena Batten"

## 2013-04-02 DIAGNOSIS — H43819 Vitreous degeneration, unspecified eye: Secondary | ICD-10-CM | POA: Diagnosis not present

## 2013-04-02 DIAGNOSIS — H269 Unspecified cataract: Secondary | ICD-10-CM | POA: Diagnosis not present

## 2013-04-02 NOTE — Assessment & Plan Note (Addendum)
Patient endorses visual changes.  Visual acuity measured -- R 20/60 corrected, L 20/30 corrected, B - 20/30 corrected.  PE WNL.  Patient asymptomatic at time of visit.  Will check ESR and Ct Head without contrast.  Patient to follow-up with Ophthalmologist pending normal results.  Patient instructed on red flag symptoms and when to proceed to ER.

## 2013-04-02 NOTE — Progress Notes (Signed)
Patient ID: Heather [name], female   DOB: 24-Nov-1934, 78 y.o.   MRN: [id number]  Patient presents to clinic today c/o sensation of swelling behind right eye, associated with intermittent supraorbital pain x 1.5 days.  Patient states she had one episode associated with blurry vision.  Notes some mild photophobia.  Denies redness or discharge.  Denies symptoms of L eye.  Patient endorses history of cataract of R eye for which she sees Ophthalmologist.   Has history of TIA, but denies facial weakness, numbness, tingling.  Denies focal muscle weakness or difficulty with speech or ambulation.  Has otherwise felt fine.  Past Medical History  Diagnosis Date  . Coronary artery disease     post percutaneous coronary intervention in 2006  . Hyperlipidemia   . Restless legs     takes mirapex daily  . Fatigue   . Persistent disorder of initiating or maintaining sleep   . Dermatitis   . S/P inguinal herniorrhaphy   . DJD (degenerative joint disease) of knee 09/18/2011    knees- both, elbow- R, ankles   . Hypertension     takes medications daily  . GERD (gastroesophageal reflux disease)     prilosec daily  . H/O dizziness     pre-syncope/due to dehydration  . Occasional tremors   . Diverticulitis     hx of  . Gastric ulcer     hx of  . Angina   . Atrial fibrillation   . Dysrhythmia, cardiac     "palpitations"  . Borderline diabetes   . H/O hiatal hernia   . Stroke 2000    TIA- series that have resolved-  . Hx of total knee replacement 11/12/11    left knee    Current Outpatient Prescriptions on File Prior to Visit  Medication Sig Dispense Refill  . aspirin 81 MG tablet Take 81 mg by mouth daily.      . calcium-vitamin D (OSCAL WITH D) 500-200 MG-UNIT per tablet Take 1 tablet by mouth 2 (two) times daily.      . isosorbide mononitrate (IMDUR) 60 MG 24 hr tablet TAKE ONE-HALF TABLET BY MOUTH EVERY DAY  30 tablet  3  . KLOR-CON M20 20 MEQ tablet TAKE ONE TABLET BY MOUTH EVERY DAY  30 tablet  9   . losartan-hydrochlorothiazide (HYZAAR) 100-25 MG per tablet TAKE ONE TABLET BY MOUTH EVERY DAY  90 tablet  1  . magnesium oxide (MAG-OX) 400 MG tablet Take 400 mg by mouth 2 (two) times daily.       . metoprolol succinate (TOPROL-XL) 100 MG 24 hr tablet Take one-half tablet by mouth twice a day. Take with or immediately following a meal.  180 tablet  0  . omeprazole (PRILOSEC) 40 MG capsule Take 40 mg by mouth 2 (two) times daily.       . simvastatin (ZOCOR) 20 MG tablet TAKE ONE TABLET BY MOUTH AT BEDTIME  30 tablet  6  . [DISCONTINUED] nitroGLYCERIN (NITROSTAT) 0.4 MG SL tablet Place 0.4 mg under the tongue every 5 (five) minutes as needed.         No current facility-administered medications on file prior to visit.    No Known Allergies  Family History  Problem Relation Age of Onset  . Arthritis      family hx of  . Hypertension      family hx of  . Other      family hx of cardiovascular disorder  . Stroke Father  family hx of M 1st degree relative <50  . Coronary artery disease Mother   . Heart disease Mother   . Cancer Brother     bladder  . Breast cancer Neg Hx   . Colon cancer Neg Hx   . Anesthesia problems Neg Hx   . Hypotension Neg Hx   . Malignant hyperthermia Neg Hx   . Pseudochol deficiency Neg Hx   . Stroke Brother   . Cancer Brother     History   Social History  . Marital Status: Married    Spouse Name: N/A    Number of Children: N/A  . Years of Education: N/A   Occupational History  . works partime in office    Social History Main Topics  . Smoking status: Never Smoker   . Smokeless tobacco: Never Used  . Alcohol Use: No  . Drug Use: No  . Sexual Activity: Yes    Birth Control/ Protection: Surgical   Other Topics Concern  . None   Social History Narrative  . None   Review of Systems  Constitutional: Negative for fever, chills and malaise/fatigue.  HENT: Negative for hearing loss, ear pain and tinnitus.   Eyes: Positive for blurred  vision, photophobia and pain. Negative for double vision, discharge and redness.  Respiratory: Negative for shortness of breath.   Cardiovascular: Negative for chest pain and palpitations.  Skin: Negative for rash.  Neurological: Negative for dizziness, tingling, tremors, sensory change, speech change, focal weakness, loss of consciousness and headaches.   Filed Vitals:   04/01/13 1050  BP: 120/82  Pulse: 57  Temp: 97.7 F (36.5 C)  Resp: 14   Physical Exam  Vitals reviewed. Constitutional: She is oriented to person, place, and time and well-developed, well-nourished, and in no distress.  HENT:  Head: Normocephalic and atraumatic.  Right Ear: External ear normal.  Left Ear: External ear normal.  Nose: Nose normal.  Mouth/Throat: Oropharynx is clear and moist. No oropharyngeal exudate.  Eyes: Right eye visual fields normal and left eye visual fields normal. Conjunctivae, EOM and lids are normal. Pupils are equal, round, and reactive to light.  Neck: Neck supple.  Cardiovascular: Normal rate, regular rhythm, normal heart sounds and intact distal pulses.   Pulmonary/Chest: Effort normal and breath sounds normal.  Lymphadenopathy:    She has no cervical adenopathy.  Neurological: She is alert and oriented to person, place, and time. She displays normal reflexes. No cranial nerve deficit. Coordination normal. GCS score is 15.  Skin: Skin is warm and dry. No rash noted.     Recent Results (from the past 2160 hour(s))  LIPID PANEL     Status: None   Collection Time    02/18/13 10:51 AM      Result Value Range   Cholesterol 146  0 - 200 mg/dL   Comment: ATP III Classification       Desirable:  < 200 mg/dL               Borderline High:  200 - 239 mg/dL          High:  > = 409 mg/dL   Triglycerides 811.9  0.0 - 149.0 mg/dL   Comment: Normal:  <147 mg/dLBorderline High:  150 - 199 mg/dL   HDL 82.95  >62.13 mg/dL   VLDL 08.6  0.0 - 57.8 mg/dL   LDL Cholesterol 69  0 - 99 mg/dL    Total CHOL/HDL Ratio 3     Comment:  Men          Women1/2 Average Risk     3.4          3.3Average Risk          5.0          4.42X Average Risk          9.6          7.13X Average Risk          15.0          11.0                      HEPATIC FUNCTION PANEL     Status: None   Collection Time    02/18/13 10:51 AM      Result Value Range   Total Bilirubin 0.9  0.3 - 1.2 mg/dL   Bilirubin, Direct 0.1  0.0 - 0.3 mg/dL   Alkaline Phosphatase 84  39 - 117 U/L   AST 22  0 - 37 U/L   ALT 24  0 - 35 U/L   Total Protein 6.8  6.0 - 8.3 g/dL   Albumin 4.1  3.5 - 5.2 g/dL  SEDIMENTATION RATE     Status: None   Collection Time    04/01/13 11:33 AM      Result Value Range   Sed Rate 4  0 - 22 mm/hr    Assessment/Plan: Pain of right eye Patient endorses visual changes.  Visual acuity measured -- R 20/60 corrected, L 20/30 corrected, B - 20/30 corrected.  PE WNL.  Patient asymptomatic at time of visit.  Will check ESR and Ct Head without contrast.  Patient to follow-up with Ophthalmologist pending normal results.  Patient instructed on red flag symptoms and when to proceed to ER.

## 2013-04-24 ENCOUNTER — Encounter (HOSPITAL_COMMUNITY): Payer: Self-pay | Admitting: Emergency Medicine

## 2013-04-24 ENCOUNTER — Emergency Department (HOSPITAL_COMMUNITY)
Admission: EM | Admit: 2013-04-24 | Discharge: 2013-04-24 | Disposition: A | Discharge status: Home / Self Care | Payer: Medicare Other | Attending: Emergency Medicine | Admitting: Emergency Medicine

## 2013-04-24 DIAGNOSIS — Z96659 Presence of unspecified artificial knee joint: Secondary | ICD-10-CM | POA: Diagnosis not present

## 2013-04-24 DIAGNOSIS — M79609 Pain in unspecified limb: Secondary | ICD-10-CM | POA: Insufficient documentation

## 2013-04-24 DIAGNOSIS — Z7982 Long term (current) use of aspirin: Secondary | ICD-10-CM | POA: Insufficient documentation

## 2013-04-24 DIAGNOSIS — Z79899 Other long term (current) drug therapy: Secondary | ICD-10-CM | POA: Insufficient documentation

## 2013-04-24 DIAGNOSIS — Z87898 Personal history of other specified conditions: Secondary | ICD-10-CM | POA: Diagnosis not present

## 2013-04-24 DIAGNOSIS — M542 Cervicalgia: Secondary | ICD-10-CM | POA: Diagnosis not present

## 2013-04-24 DIAGNOSIS — M25519 Pain in unspecified shoulder: Secondary | ICD-10-CM | POA: Diagnosis not present

## 2013-04-24 DIAGNOSIS — E785 Hyperlipidemia, unspecified: Secondary | ICD-10-CM | POA: Insufficient documentation

## 2013-04-24 DIAGNOSIS — Z8679 Personal history of other diseases of the circulatory system: Secondary | ICD-10-CM | POA: Insufficient documentation

## 2013-04-24 DIAGNOSIS — R209 Unspecified disturbances of skin sensation: Secondary | ICD-10-CM | POA: Insufficient documentation

## 2013-04-24 DIAGNOSIS — L259 Unspecified contact dermatitis, unspecified cause: Secondary | ICD-10-CM | POA: Insufficient documentation

## 2013-04-24 DIAGNOSIS — M79601 Pain in right arm: Secondary | ICD-10-CM

## 2013-04-24 DIAGNOSIS — I4891 Unspecified atrial fibrillation: Secondary | ICD-10-CM | POA: Insufficient documentation

## 2013-04-24 DIAGNOSIS — IMO0002 Reserved for concepts with insufficient information to code with codable children: Secondary | ICD-10-CM | POA: Diagnosis not present

## 2013-04-24 DIAGNOSIS — Z9889 Other specified postprocedural states: Secondary | ICD-10-CM | POA: Insufficient documentation

## 2013-04-24 DIAGNOSIS — M1710 Unilateral primary osteoarthritis, unspecified knee: Secondary | ICD-10-CM | POA: Insufficient documentation

## 2013-04-24 DIAGNOSIS — G4700 Insomnia, unspecified: Secondary | ICD-10-CM | POA: Diagnosis not present

## 2013-04-24 DIAGNOSIS — I2510 Atherosclerotic heart disease of native coronary artery without angina pectoris: Secondary | ICD-10-CM | POA: Insufficient documentation

## 2013-04-24 DIAGNOSIS — I517 Cardiomegaly: Secondary | ICD-10-CM | POA: Diagnosis not present

## 2013-04-24 DIAGNOSIS — K219 Gastro-esophageal reflux disease without esophagitis: Secondary | ICD-10-CM | POA: Insufficient documentation

## 2013-04-24 DIAGNOSIS — Z8719 Personal history of other diseases of the digestive system: Secondary | ICD-10-CM | POA: Insufficient documentation

## 2013-04-24 DIAGNOSIS — Z8673 Personal history of transient ischemic attack (TIA), and cerebral infarction without residual deficits: Secondary | ICD-10-CM | POA: Diagnosis not present

## 2013-04-24 DIAGNOSIS — G2581 Restless legs syndrome: Secondary | ICD-10-CM | POA: Insufficient documentation

## 2013-04-24 DIAGNOSIS — R002 Palpitations: Secondary | ICD-10-CM | POA: Diagnosis not present

## 2013-04-24 DIAGNOSIS — R6889 Other general symptoms and signs: Secondary | ICD-10-CM | POA: Diagnosis not present

## 2013-04-24 DIAGNOSIS — I10 Essential (primary) hypertension: Secondary | ICD-10-CM | POA: Diagnosis not present

## 2013-04-24 LAB — BASIC METABOLIC PANEL
GFR calc Af Amer: 72 mL/min — ABNORMAL LOW (ref >90)
GFR calc non Af Amer: 62 mL/min — ABNORMAL LOW (ref >90)
Potassium: 4.3 mEq/L (ref 3.5–5.1)
Sodium: 140 mEq/L (ref 135–145)

## 2013-04-24 LAB — CBC WITH DIFFERENTIAL/PLATELET
Basophils Relative: 0 % (ref 0–1)
Eosinophils Absolute: 0.2 10*3/uL (ref 0.0–0.7)
Lymphs Abs: 1 10*3/uL (ref 0.7–4.0)
MCH: 31 pg (ref 26.0–34.0)
Neutrophils Relative %: 65 % (ref 43–77)
Platelets: 129 10*3/uL — ABNORMAL LOW (ref 150–400)
RBC: 4.19 MIL/uL (ref 3.87–5.11)

## 2013-04-24 LAB — POCT I-STAT TROPONIN I: Troponin i, poc: 0 ng/mL (ref 0.00–0.08)

## 2013-04-24 NOTE — ED Provider Notes (Signed)
CSN: [id number]     Arrival date & time 04/24/13  0035 History   First MD Initiated Contact with Patient 04/24/13 0138     Chief Complaint  Patient presents with  . Hypertension   (Consider location/radiation/quality/duration/timing/severity/associated sxs/prior Treatment) HPI Since noon yesterday constant burning feeling right upper arm radiated to shoulder and right side of neck since about 6pm, resolved after NTG from EMS after had well over 12 hours of constant Sxs, no CP/SOB or focal weak/numb. Now asymptomatic. Pain was mild nonpleuritic, nonexertional and nonpositional. Past Medical History  Diagnosis Date  . Coronary artery disease     post percutaneous coronary intervention in 2006  . Hyperlipidemia   . Restless legs     takes mirapex daily  . Fatigue   . Persistent disorder of initiating or maintaining sleep   . Dermatitis   . S/P inguinal herniorrhaphy   . DJD (degenerative joint disease) of knee 09/18/2011    knees- both, elbow- R, ankles   . Hypertension     takes medications daily  . GERD (gastroesophageal reflux disease)     prilosec daily  . H/O dizziness     pre-syncope/due to dehydration  . Occasional tremors   . Diverticulitis     hx of  . Gastric ulcer     hx of  . Angina   . Atrial fibrillation   . Dysrhythmia, cardiac     "palpitations"  . Borderline diabetes   . H/O hiatal hernia   . Stroke 2000    TIA- series that have resolved-  . Hx of total knee replacement 11/12/11    left knee   Past Surgical History  Procedure Laterality Date  . Abdominal hysterectomy    . Knee arthroscopy      2 on left and 1 on right  . Foot surgery      left foot stress fracture repair  . Hernia repair      1989- esophageal hernia   . Upper gastrointestinal endoscopy  2013  . Total knee arthroplasty  11/12/2011    Procedure: TOTAL KNEE ARTHROPLASTY;  Surgeon: Nilda Simmer, MD;  Location: [facility] OR;  Service: Orthopedics;  Laterality: Left;  Dr Thurston Hole wants 90 minutes  for this case  . Brain surgery    . Coronary angioplasty  03/2005; 06/2005    "1" plus "1"= "2 total"   Family History  Problem Relation Age of Onset  . Arthritis      family hx of  . Hypertension      family hx of  . Other      family hx of cardiovascular disorder  . Stroke Father     family hx of M 1st degree relative <50  . Coronary artery disease Mother   . Heart disease Mother   . Cancer Brother     bladder  . Breast cancer Neg Hx   . Colon cancer Neg Hx   . Anesthesia problems Neg Hx   . Hypotension Neg Hx   . Malignant hyperthermia Neg Hx   . Pseudochol deficiency Neg Hx   . Stroke Brother   . Cancer Brother    History  Substance Use Topics  . Smoking status: Never Smoker   . Smokeless tobacco: Never Used  . Alcohol Use: No   OB History   Grav Para Term Preterm Abortions TAB SAB Ect Mult Living                 Review of  Systems 10 Systems reviewed and are negative for acute change except as noted in the HPI. Allergies  Review of patient's allergies indicates no known allergies.  Home Medications   Current Outpatient Rx  Name  Route  Sig  Dispense  Refill  . amLODipine (NORVASC) 10 MG tablet   Oral   Take 0.5 tablets (5 mg total) by mouth every morning.   30 tablet   2   . aspirin 81 MG tablet   Oral   Take 81 mg by mouth daily.         . Calcium-Vitamin D-Vitamin K 500-100-40 MG-UNT-MCG CHEW   Oral   Chew 3 capsules by mouth daily.         . isosorbide mononitrate (IMDUR) 60 MG 24 hr tablet      TAKE ONE-HALF TABLET BY MOUTH EVERY DAY   30 tablet   3   . KLOR-CON M20 20 MEQ tablet      TAKE ONE TABLET BY MOUTH EVERY DAY   30 tablet   9   . magnesium oxide (MAG-OX) 400 MG tablet   Oral   Take 400 mg by mouth 2 (two) times daily.          . metoprolol succinate (TOPROL-XL) 100 MG 24 hr tablet      Take one-half tablet by mouth twice a day. Take with or immediately following a meal.   180 tablet   0   . omeprazole (PRILOSEC)  40 MG capsule   Oral   Take 40 mg by mouth 2 (two) times daily.          . pramipexole (MIRAPEX) 0.25 MG tablet   Oral   Take 1 tablet (0.25 mg total) by mouth 2 (two) times daily.   60 tablet   2   . simvastatin (ZOCOR) 20 MG tablet      TAKE ONE TABLET BY MOUTH AT BEDTIME   30 tablet   6     Patient has to keep her 02/04/13 appt with Dr. Annice Pih ...   . losartan-hydrochlorothiazide (HYZAAR) 100-25 MG per tablet      TAKE ONE TABLET BY MOUTH ONCE DAILY   90 tablet   3    BP 139/49  Pulse 55  Temp(Src) 98.5 F (36.9 C) (Oral)  Resp 13  Ht 5\' 3"  (1.6 m)  Wt 165 lb (74.844 kg)  BMI 29.24 kg/m2  SpO2 95% Physical Exam  Nursing note and vitals reviewed. Constitutional:  Awake, alert, nontoxic appearance.  HENT:  Head: Atraumatic.  Eyes: Right eye exhibits no discharge. Left eye exhibits no discharge.  Neck: Neck supple.  Cardiovascular: Normal rate and regular rhythm.   No murmur heard. Pulmonary/Chest: Effort normal and breath sounds normal. No respiratory distress. She has no wheezes. She has no rales. She exhibits no tenderness.  Abdominal: Soft. Bowel sounds are normal. She exhibits no distension. There is no tenderness. There is no rebound and no guarding.  Musculoskeletal: She exhibits no edema and no tenderness.  Baseline ROM, no obvious new focal weakness. Right arm NT with intact LT and motor axillary, median, ulnar, radial function. CR<2secs.  Neurological:  Mental status and motor strength appears baseline for patient and situation.  Skin: No rash noted.  Psychiatric: She has a normal mood and affect.    ED Course  Procedures (including critical care time) D/w Cards for OutPt f/u feel low risk for ACS.Patient / Family / Caregiver informed of clinical course, understand medical decision-making process,  and agree with plan. Labs Review Labs Reviewed  CBC WITH DIFFERENTIAL - Abnormal; Notable for the following:    Platelets 129 (*)    All other  components within normal limits  BASIC METABOLIC PANEL - Abnormal; Notable for the following:    Glucose, Bld 134 (*)    BUN 24 (*)    GFR calc non Af Amer 62 (*)    GFR calc Af Amer 72 (*)    All other components within normal limits  POCT I-STAT TROPONIN I   Imaging Review No results found.  EKG Interpretation     Ventricular Rate:  57 PR Interval:  193 QRS Duration: 97 QT Interval:  430 QTC Calculation: 419 R Axis:   -27 Text Interpretation:  Age not entered, assumed to be  78 years old for purpose of ECG interpretation Sinus rhythm Abnormal R-wave progression, early transition Left ventricular hypertrophy No significant change since last tracing            MDM   1. Arm pain, right    I doubt any other EMC precluding discharge at this time including, but not necessarily limited to the following:non-low risk for ACS.    Hurman Horn, MD 05/10/13 2043

## 2013-04-24 NOTE — ED Notes (Signed)
Pt took 324 asa prior to EMS' arrival. Patient's HR in the  60's, NSR on monitor. BP initally 210/90. C/O R arm pain that radiates to her jaw and back. Patient had 2 stents placed in 2005. Ax4, NAD. Last BP 190/90.

## 2013-05-01 ENCOUNTER — Other Ambulatory Visit: Payer: Self-pay | Admitting: Cardiovascular Disease

## 2013-05-05 ENCOUNTER — Ambulatory Visit: Payer: Medicare Other | Admitting: Nurse Practitioner

## 2013-05-14 ENCOUNTER — Other Ambulatory Visit: Payer: Self-pay

## 2013-05-26 DIAGNOSIS — H52229 Regular astigmatism, unspecified eye: Secondary | ICD-10-CM | POA: Diagnosis not present

## 2013-05-26 DIAGNOSIS — H43819 Vitreous degeneration, unspecified eye: Secondary | ICD-10-CM | POA: Diagnosis not present

## 2013-05-26 DIAGNOSIS — H2510 Age-related nuclear cataract, unspecified eye: Secondary | ICD-10-CM | POA: Diagnosis not present

## 2013-05-26 DIAGNOSIS — H5200 Hypermetropia, unspecified eye: Secondary | ICD-10-CM | POA: Diagnosis not present

## 2013-05-29 ENCOUNTER — Other Ambulatory Visit: Payer: Self-pay | Admitting: Cardiovascular Disease

## 2013-05-29 ENCOUNTER — Other Ambulatory Visit: Payer: Self-pay | Admitting: Cardiology

## 2013-05-29 MED ORDER — METOPROLOL SUCCINATE ER 50 MG PO TB24: 50.0000 mg | ORAL_TABLET | Freq: Two times a day (BID) | ORAL | Status: DC

## 2013-06-22 DIAGNOSIS — Z23 Encounter for immunization: Secondary | ICD-10-CM | POA: Diagnosis not present

## 2013-06-29 ENCOUNTER — Other Ambulatory Visit: Payer: Self-pay | Admitting: Physician Assistant

## 2013-06-29 NOTE — Telephone Encounter (Signed)
Rx request to pharmacy/SLS  

## 2013-07-09 HISTORY — PX: CATARACT EXTRACTION W/ INTRAOCULAR LENS  IMPLANT, BILATERAL: SHX1307

## 2013-07-29 ENCOUNTER — Other Ambulatory Visit: Payer: Self-pay | Admitting: Family Medicine

## 2013-07-29 NOTE — Telephone Encounter (Signed)
Please advise refill? We have never seen patient?

## 2013-08-28 ENCOUNTER — Other Ambulatory Visit: Payer: Self-pay | Admitting: Physician Assistant

## 2013-08-28 DIAGNOSIS — H2510 Age-related nuclear cataract, unspecified eye: Secondary | ICD-10-CM | POA: Diagnosis not present

## 2013-08-28 NOTE — Telephone Encounter (Signed)
Rx request to pharmacy/SLS  

## 2013-09-09 ENCOUNTER — Other Ambulatory Visit: Payer: Self-pay | Admitting: Physician Assistant

## 2013-09-09 ENCOUNTER — Other Ambulatory Visit: Payer: Self-pay | Admitting: Cardiovascular Disease

## 2013-09-16 ENCOUNTER — Telehealth: Payer: Self-pay | Admitting: Cardiovascular Disease

## 2013-09-16 NOTE — Telephone Encounter (Signed)
Received request from Nurse fax box, documents faxed for surgical clearance. To: [facility] Fax number: [phone] Attention: [phone]

## 2013-09-28 ENCOUNTER — Other Ambulatory Visit: Payer: Self-pay | Admitting: Physician Assistant

## 2013-09-28 NOTE — Telephone Encounter (Signed)
30 day supply of mirapex sent to pharmacy. Pt last seen in 03/2013 and has no future appts scheduled.  Please advise when pt should follow up?

## 2013-09-28 NOTE — Telephone Encounter (Signed)
Needs appt with one of [place] in the next 1-2 months with one of [place]

## 2013-09-29 NOTE — Telephone Encounter (Signed)
OK with me.

## 2013-09-29 NOTE — Telephone Encounter (Signed)
That is fine with me.  I have only seen patient once for an acute so I guess I was placed as her PCP by default.

## 2013-09-29 NOTE — Telephone Encounter (Signed)
Informed patient of medication refill and scheduled her to see Melissa. Patient states that she wants to transfer from [place] to [place]. Is this okay? Thanks.

## 2013-09-29 NOTE — Telephone Encounter (Signed)
Please inform pt that she needs to schedule an appt with [facility] for 1-2 months

## 2013-09-29 NOTE — Telephone Encounter (Signed)
Left message for patient to return my call.

## 2013-10-02 ENCOUNTER — Ambulatory Visit (INDEPENDENT_AMBULATORY_CARE_PROVIDER_SITE_OTHER): Payer: Medicare Other | Admitting: Family Medicine

## 2013-10-02 ENCOUNTER — Encounter: Payer: Self-pay | Admitting: Family Medicine

## 2013-10-02 VITALS — BP 152/78 | HR 64 | Temp 97.9°F | Ht 63.0 in | Wt 171.0 lb

## 2013-10-02 DIAGNOSIS — I10 Essential (primary) hypertension: Secondary | ICD-10-CM | POA: Diagnosis not present

## 2013-10-02 DIAGNOSIS — K219 Gastro-esophageal reflux disease without esophagitis: Secondary | ICD-10-CM | POA: Diagnosis not present

## 2013-10-02 DIAGNOSIS — R300 Dysuria: Secondary | ICD-10-CM

## 2013-10-02 DIAGNOSIS — G2581 Restless legs syndrome: Secondary | ICD-10-CM | POA: Diagnosis not present

## 2013-10-02 DIAGNOSIS — E119 Type 2 diabetes mellitus without complications: Secondary | ICD-10-CM

## 2013-10-02 LAB — URINALYSIS
BILIRUBIN URINE: NEGATIVE
Glucose, UA: NEGATIVE mg/dL
Hgb urine dipstick: NEGATIVE
Ketones, ur: NEGATIVE mg/dL
Nitrite: POSITIVE — AB
PH: 7 (ref 5.0–8.0)
PROTEIN: NEGATIVE mg/dL
Specific Gravity, Urine: 1.007 (ref 1.005–1.030)
Urobilinogen, UA: 1 mg/dL (ref 0.0–1.0)

## 2013-10-02 MED ORDER — PHENAZOPYRIDINE HCL 200 MG PO TABS: 200.0000 mg | ORAL_TABLET | Freq: Three times a day (TID) | ORAL | Status: DC | PRN

## 2013-10-02 MED ORDER — CEFDINIR 300 MG PO CAPS: 300.0000 mg | ORAL_CAPSULE | Freq: Two times a day (BID) | ORAL | Status: AC

## 2013-10-02 MED ORDER — PRAMIPEXOLE DIHYDROCHLORIDE 0.25 MG PO TABS: 0.2500 mg | ORAL_TABLET | Freq: Two times a day (BID) | ORAL | Status: DC

## 2013-10-02 NOTE — Patient Instructions (Signed)
Probiotic such as Digestive Advantage daily    Urinary Tract Infection Urinary tract infections (UTIs) can develop anywhere along your urinary tract. Your urinary tract is your body's drainage system for removing wastes and extra water. Your urinary tract includes two kidneys, two ureters, a bladder, and a urethra. Your kidneys are a pair of bean-shaped organs. Each kidney is about the size of your fist. They are located below your ribs, one on each side of your spine. CAUSES Infections are caused by microbes, which are microscopic organisms, including fungi, viruses, and bacteria. These organisms are so small that they can only be seen through a microscope. Bacteria are the microbes that most commonly cause UTIs. SYMPTOMS  Symptoms of UTIs may vary by age and gender of the patient and by the location of the infection. Symptoms in young women typically include a frequent and intense urge to urinate and a painful, burning feeling in the bladder or urethra during urination. Older women and men are more likely to be tired, shaky, and weak and have muscle aches and abdominal pain. A fever may mean the infection is in your kidneys. Other symptoms of a kidney infection include pain in your back or sides below the ribs, nausea, and vomiting. DIAGNOSIS To diagnose a UTI, your caregiver will ask you about your symptoms. Your caregiver also will ask to provide a urine sample. The urine sample will be tested for bacteria and white blood cells. White blood cells are made by your body to help fight infection. TREATMENT  Typically, UTIs can be treated with medication. Because most UTIs are caused by a bacterial infection, they usually can be treated with the use of antibiotics. The choice of antibiotic and length of treatment depend on your symptoms and the type of bacteria causing your infection. HOME CARE INSTRUCTIONS  If you were prescribed antibiotics, take them exactly as your caregiver instructs you. Finish  the medication even if you feel better after you have only taken some of the medication.  Drink enough water and fluids to keep your urine clear or pale yellow.  Avoid caffeine, tea, and carbonated beverages. They tend to irritate your bladder.  Empty your bladder often. Avoid holding urine for long periods of time.  Empty your bladder before and after sexual intercourse.  After a bowel movement, women should cleanse from front to back. Use each tissue only once. SEEK MEDICAL CARE IF:   You have back pain.  You develop a fever.  Your symptoms do not begin to resolve within 3 days. SEEK IMMEDIATE MEDICAL CARE IF:   You have severe back pain or lower abdominal pain.  You develop chills.  You have nausea or vomiting.  You have continued burning or discomfort with urination. MAKE SURE YOU:   Understand these instructions.  Will watch your condition.  Will get help right away if you are not doing well or get worse. Document Released: 04/04/2005 Document Revised: 12/25/2011 Document Reviewed: 08/03/2011 [facility] Patient Information 2014 [place].

## 2013-10-02 NOTE — Progress Notes (Signed)
Pre visit review using our clinic review tool, if applicable. No additional management support is needed unless otherwise documented below in the visit note. 

## 2013-10-04 ENCOUNTER — Encounter: Payer: Self-pay | Admitting: Family Medicine

## 2013-10-04 DIAGNOSIS — G2581 Restless legs syndrome: Secondary | ICD-10-CM | POA: Insufficient documentation

## 2013-10-04 DIAGNOSIS — N390 Urinary tract infection, site not specified: Secondary | ICD-10-CM | POA: Insufficient documentation

## 2013-10-04 LAB — CULTURE, URINE COMPREHENSIVE: Colony Count: >=100000

## 2013-10-04 NOTE — Assessment & Plan Note (Signed)
Mild elevation with acute illness, will monitor

## 2013-10-04 NOTE — Progress Notes (Signed)
Patient ID: Heather [name], female   DOB: Mar 13, 1935, 79 y.o.   MRN: [id number] Heather [name] [id number] [birth year] 10/04/2013      Progress Note-Follow Up  Subjective  Chief Complaint  Chief Complaint  Patient presents with  . Urinary Tract Infection    X 2 days urine urgency, hurts while urinating    HPI  Patient is a 79 year old female in today for routine medical care. He is in today complaining of 2 days with of urinary urgency, frequency and dysuria. He complains of some suprapubic pressure but he denies fevers or chills. He denies myalgias, back pain, nausea or vomiting. No change in bowel habits or appetite. Denies CP/palp/SOB/HA/congestion/fevers/GI c/o. Taking meds as prescribed  Past Medical History  Diagnosis Date  . Coronary artery disease     post percutaneous coronary intervention in 2006  . Hyperlipidemia   . Restless legs     takes mirapex daily  . Fatigue   . Persistent disorder of initiating or maintaining sleep   . Dermatitis   . S/P inguinal herniorrhaphy   . DJD (degenerative joint disease) of knee 09/18/2011    knees- both, elbow- R, ankles   . Hypertension     takes medications daily  . GERD (gastroesophageal reflux disease)     prilosec daily  . H/O dizziness     pre-syncope/due to dehydration  . Occasional tremors   . Diverticulitis     hx of  . Gastric ulcer     hx of  . Angina   . Atrial fibrillation   . Dysrhythmia, cardiac     "palpitations"  . Borderline diabetes   . H/O hiatal hernia   . Stroke 2000    TIA- series that have resolved-  . Hx of total knee replacement 11/12/11    left knee  . UTI (lower urinary tract infection) 10/04/2013    Past Surgical History  Procedure Laterality Date  . Abdominal hysterectomy    . Knee arthroscopy      2 on left and 1 on right  . Foot surgery      left foot stress fracture repair  . Hernia repair      1989- esophageal hernia   . Upper gastrointestinal endoscopy  2013  . Total knee  arthroplasty  11/12/2011    Procedure: TOTAL KNEE ARTHROPLASTY;  Surgeon: Lorn Junes, MD;  Location: [facility];  Service: Orthopedics;  Laterality: Left;  Dr Noemi Chapel wants 90 minutes for this case  . Brain surgery    . Coronary angioplasty  03/2005; 06/2005    "1" plus "1"= "2 total"    Family History  Problem Relation Age of Onset  . Arthritis      family hx of  . Hypertension      family hx of  . Other      family hx of cardiovascular disorder  . Stroke Father     family hx of M 1st degree relative <50  . Coronary artery disease Mother   . Heart disease Mother   . Cancer Brother     bladder  . Breast cancer Neg Hx   . Colon cancer Neg Hx   . Anesthesia problems Neg Hx   . Hypotension Neg Hx   . Malignant hyperthermia Neg Hx   . Pseudochol deficiency Neg Hx   . Stroke Brother   . Cancer Brother     History   Social History  . Marital Status: Married  Spouse Name: N/A    Number of Children: N/A  . Years of Education: N/A   Occupational History  . works partime in office    Social History Main Topics  . Smoking status: Never Smoker   . Smokeless tobacco: Never Used  . Alcohol Use: No  . Drug Use: No  . Sexual Activity: Yes    Birth Control/ Protection: Surgical   Other Topics Concern  . Not on file   Social History Narrative  . No narrative on file    Current Outpatient Prescriptions on File Prior to Visit  Medication Sig Dispense Refill  . amLODipine (NORVASC) 10 MG tablet TAKE ONE TABLET BY MOUTH IN THE MORNING  30 tablet  0  . aspirin 81 MG tablet Take 81 mg by mouth daily.      . Calcium-Vitamin D-Vitamin K 500-100-40 MG-UNT-MCG CHEW Chew 3 capsules by mouth daily.      . isosorbide mononitrate (IMDUR) 60 MG 24 hr tablet TAKE ONE-HALF TABLET BY MOUTH ONCE DAILY  30 tablet  5  . KLOR-CON M20 20 MEQ tablet TAKE ONE TABLET BY MOUTH EVERY DAY  30 tablet  9  . losartan-hydrochlorothiazide (HYZAAR) 100-25 MG per tablet TAKE ONE TABLET BY MOUTH ONCE DAILY   90 tablet  3  . magnesium oxide (MAG-OX) 400 MG tablet Take 400 mg by mouth 2 (two) times daily.       . metoprolol succinate (TOPROL-XL) 50 MG 24 hr tablet Take 1 tablet (50 mg total) by mouth 2 (two) times daily. Take with or immediately following a meal.  180 tablet  1  . omeprazole (PRILOSEC) 40 MG capsule Take 40 mg by mouth 2 (two) times daily.       . simvastatin (ZOCOR) 20 MG tablet TAKE ONE TABLET BY MOUTH AT BEDTIME  30 tablet  3  . [DISCONTINUED] nitroGLYCERIN (NITROSTAT) 0.4 MG SL tablet Place 0.4 mg under the tongue every 5 (five) minutes as needed.         No current facility-administered medications on file prior to visit.    No Known Allergies  Review of Systems  Review of Systems  Constitutional: Negative for fever and malaise/fatigue.  HENT: Negative for congestion.   Eyes: Negative for discharge.  Respiratory: Negative for shortness of breath.   Cardiovascular: Negative for chest pain, palpitations and leg swelling.  Gastrointestinal: Positive for abdominal pain. Negative for nausea and diarrhea.  Genitourinary: Positive for dysuria, urgency and frequency. Negative for hematuria and flank pain.  Musculoskeletal: Negative for falls.  Skin: Negative for rash.  Neurological: Negative for loss of consciousness and headaches.  Endo/Heme/Allergies: Negative for polydipsia.  Psychiatric/Behavioral: Negative for depression and suicidal ideas. The patient is not nervous/anxious and does not have insomnia.     Objective  BP 152/78  Pulse 64  Temp(Src) 97.9 F (36.6 C) (Oral)  Ht 5\' 3"  (1.6 m)  Wt 171 lb 0.6 oz (77.583 kg)  BMI 30.31 kg/m2  SpO2 97%  Physical Exam  Physical Exam  Constitutional: She is oriented to person, place, and time and well-developed, well-nourished, and in no distress. No distress.  HENT:  Head: Normocephalic and atraumatic.  Eyes: Conjunctivae are normal.  Neck: Neck supple. No thyromegaly present.  Cardiovascular: Normal rate, regular  rhythm and normal heart sounds.   No murmur heard. Pulmonary/Chest: Effort normal and breath sounds normal. She has no wheezes.  Abdominal: She exhibits no distension and no mass.  Musculoskeletal: She exhibits no edema.  Lymphadenopathy:  She has no cervical adenopathy.  Neurological: She is alert and oriented to person, place, and time.  Skin: Skin is warm and dry. No rash noted. She is not diaphoretic.  Psychiatric: Memory, affect and judgment normal.    Lab Results  Component Value Date   TSH 1.637 01/04/2012   Lab Results  Component Value Date   WBC 5.1 04/24/2013   HGB 13.0 04/24/2013   HCT 38.0 04/24/2013   MCV 90.7 04/24/2013   PLT 129* 04/24/2013   Lab Results  Component Value Date   CREATININE 0.88 04/24/2013   BUN 24* 04/24/2013   NA 140 04/24/2013   K 4.3 04/24/2013   CL 105 04/24/2013   CO2 27 04/24/2013   Lab Results  Component Value Date   ALT 24 02/18/2013   AST 22 02/18/2013   ALKPHOS 84 02/18/2013   BILITOT 0.9 02/18/2013   Lab Results  Component Value Date   CHOL 146 02/18/2013   Lab Results  Component Value Date   HDL 47.70 02/18/2013   Lab Results  Component Value Date   LDLCALC 69 02/18/2013   Lab Results  Component Value Date   TRIG 145.0 02/18/2013   Lab Results  Component Value Date   CHOLHDL 3 02/18/2013     Assessment & Plan  HYPERTENSION Mild elevation with acute illness, will monitor  RLS (restless legs syndrome) Given refill on Mirapex today which he finds helpful  GERD Avoid offending foods, start probiotics. Do not eat large meals in late evening and consider raising head of bed.   UTI (lower urinary tract infection) Uti, ecoli, await sensitivities, started on Pyridium and antibiotics, increase hydration  DM (diabetes mellitus) Minimize simple carbs, continue current meds, increase lean proteins

## 2013-10-04 NOTE — Assessment & Plan Note (Signed)
Given refill on Mirapex today which he finds helpful

## 2013-10-04 NOTE — Assessment & Plan Note (Signed)
Avoid offending foods, start probiotics. Do not eat large meals in late evening and consider raising head of bed.  

## 2013-10-04 NOTE — Assessment & Plan Note (Signed)
Minimize simple carbs, continue current meds, increase lean proteins

## 2013-10-04 NOTE — Assessment & Plan Note (Addendum)
Uti, ecoli, await sensitivities, started on Pyridium and antibiotics, increase hydration

## 2013-10-13 ENCOUNTER — Telehealth: Payer: Self-pay

## 2013-10-13 NOTE — Telephone Encounter (Signed)
Relevant patient education assigned to patient using Emmi. ° °

## 2013-10-19 ENCOUNTER — Telehealth: Payer: Self-pay | Admitting: Cardiovascular Disease

## 2013-10-19 NOTE — Telephone Encounter (Signed)
Received request from Nurse fax box, documents faxed for surgical clearance. To: [facility]  Fax number: [phone] Attention: [phone].Marland Kitchen Refaxed to [facility] To # Given  Above

## 2013-10-20 ENCOUNTER — Ambulatory Visit: Payer: Medicare Other | Admitting: Family

## 2013-10-22 ENCOUNTER — Encounter: Payer: Self-pay | Admitting: Family Medicine

## 2013-10-22 ENCOUNTER — Ambulatory Visit (INDEPENDENT_AMBULATORY_CARE_PROVIDER_SITE_OTHER): Payer: Medicare Other | Admitting: Family Medicine

## 2013-10-22 VITALS — BP 152/84 | HR 50 | Temp 97.6°F | Ht 62.0 in | Wt 172.0 lb

## 2013-10-22 DIAGNOSIS — I10 Essential (primary) hypertension: Secondary | ICD-10-CM | POA: Diagnosis not present

## 2013-10-22 DIAGNOSIS — N390 Urinary tract infection, site not specified: Secondary | ICD-10-CM | POA: Diagnosis not present

## 2013-10-22 DIAGNOSIS — R300 Dysuria: Secondary | ICD-10-CM

## 2013-10-22 LAB — POCT URINALYSIS DIPSTICK
Blood, UA: NEGATIVE
Glucose, UA: NEGATIVE
Ketones, UA: NEGATIVE
Nitrite, UA: POSITIVE
Spec Grav, UA: <=1.005
Urobilinogen, UA: 0.2
pH, UA: 7.5

## 2013-10-22 MED ORDER — CIPROFLOXACIN HCL 250 MG PO TABS: 250.0000 mg | ORAL_TABLET | Freq: Two times a day (BID) | ORAL | Status: DC

## 2013-10-22 NOTE — Patient Instructions (Signed)
Probiotics: Align, Digestive Advantage, Culturelle, [place] daily   Urinary Tract Infection Urinary tract infections (UTIs) can develop anywhere along your urinary tract. Your urinary tract is your body's drainage system for removing wastes and extra water. Your urinary tract includes two kidneys, two ureters, a bladder, and a urethra. Your kidneys are a pair of bean-shaped organs. Each kidney is about the size of your fist. They are located below your ribs, one on each side of your spine. CAUSES Infections are caused by microbes, which are microscopic organisms, including fungi, viruses, and bacteria. These organisms are so small that they can only be seen through a microscope. Bacteria are the microbes that most commonly cause UTIs. SYMPTOMS  Symptoms of UTIs may vary by age and gender of the patient and by the location of the infection. Symptoms in young women typically include a frequent and intense urge to urinate and a painful, burning feeling in the bladder or urethra during urination. Older women and men are more likely to be tired, shaky, and weak and have muscle aches and abdominal pain. A fever may mean the infection is in your kidneys. Other symptoms of a kidney infection include pain in your back or sides below the ribs, nausea, and vomiting. DIAGNOSIS To diagnose a UTI, your caregiver will ask you about your symptoms. Your caregiver also will ask to provide a urine sample. The urine sample will be tested for bacteria and white blood cells. White blood cells are made by your body to help fight infection. TREATMENT  Typically, UTIs can be treated with medication. Because most UTIs are caused by a bacterial infection, they usually can be treated with the use of antibiotics. The choice of antibiotic and length of treatment depend on your symptoms and the type of bacteria causing your infection. HOME CARE INSTRUCTIONS  If you were prescribed antibiotics, take them exactly as  your caregiver instructs you. Finish the medication even if you feel better after you have only taken some of the medication.  Drink enough water and fluids to keep your urine clear or pale yellow.  Avoid caffeine, tea, and carbonated beverages. They tend to irritate your bladder.  Empty your bladder often. Avoid holding urine for long periods of time.  Empty your bladder before and after sexual intercourse.  After a bowel movement, women should cleanse from front to back. Use each tissue only once. SEEK MEDICAL CARE IF:   You have back pain.  You develop a fever.  Your symptoms do not begin to resolve within 3 days. SEEK IMMEDIATE MEDICAL CARE IF:   You have severe back pain or lower abdominal pain.  You develop chills.  You have nausea or vomiting.  You have continued burning or discomfort with urination. MAKE SURE YOU:   Understand these instructions.  Will watch your condition.  Will get help right away if you are not doing well or get worse. Document Released: 04/04/2005 Document Revised: 12/25/2011 Document Reviewed: 08/03/2011 [facility] Patient Information 2014 [place].

## 2013-10-22 NOTE — Progress Notes (Signed)
Pre visit review using our clinic review tool, if applicable. No additional management support is needed unless otherwise documented below in the visit note. 

## 2013-10-25 ENCOUNTER — Encounter: Payer: Self-pay | Admitting: Family Medicine

## 2013-10-25 NOTE — Assessment & Plan Note (Signed)
Recurrent. Started on Ciprofloxacin and probiotics. Increase hydration

## 2013-10-25 NOTE — Progress Notes (Signed)
Patient ID: Heather [name], female   DOB: 10-17-1934, 79 y.o.   MRN: [id number] Heather [name] [id number] 1935/05/29 10/25/2013      Progress Note-Follow Up  Subjective  Chief Complaint  Chief Complaint  Patient presents with  . Urinary Tract Infection    X 3 days-burning w/urination, lower pressure, pt was in last month with a uti     HPI  Patient is a 79 year old female in today for routine medical care. Patient has recently been treated with antibiotics for a urinary tract infection and had been doing better for about 3 days ago when she began to have increased urinary frequency and urgency. She is noting some dysuria and some lower abdominal pain. Denies CP/palp/SOB/HA/congestion/fevers. Taking meds as prescribed  Past Medical History  Diagnosis Date  . Coronary artery disease     post percutaneous coronary intervention in 2006  . Hyperlipidemia   . Restless legs     takes mirapex daily  . Fatigue   . Persistent disorder of initiating or maintaining sleep   . Dermatitis   . S/P inguinal herniorrhaphy   . DJD (degenerative joint disease) of knee 09/18/2011    knees- both, elbow- R, ankles   . Hypertension     takes medications daily  . GERD (gastroesophageal reflux disease)     prilosec daily  . H/O dizziness     pre-syncope/due to dehydration  . Occasional tremors   . Diverticulitis     hx of  . Gastric ulcer     hx of  . Angina   . Atrial fibrillation   . Dysrhythmia, cardiac     "palpitations"  . Borderline diabetes   . H/O hiatal hernia   . Stroke 2000    TIA- series that have resolved-  . Hx of total knee replacement 11/12/11    left knee  . UTI (lower urinary tract infection) 10/04/2013    Past Surgical History  Procedure Laterality Date  . Abdominal hysterectomy    . Knee arthroscopy      2 on left and 1 on right  . Foot surgery      left foot stress fracture repair  . Hernia repair      1989- esophageal hernia   . Upper gastrointestinal endoscopy   2013  . Total knee arthroplasty  11/12/2011    Procedure: TOTAL KNEE ARTHROPLASTY;  Surgeon: Lorn Junes, MD;  Location: [facility];  Service: Orthopedics;  Laterality: Left;  Dr Noemi Chapel wants 90 minutes for this case  . Brain surgery    . Coronary angioplasty  03/2005; 06/2005    "1" plus "1"= "2 total"    Family History  Problem Relation Age of Onset  . Arthritis      family hx of  . Hypertension      family hx of  . Other      family hx of cardiovascular disorder  . Stroke Father     family hx of M 1st degree relative <50  . Coronary artery disease Mother   . Heart disease Mother   . Cancer Brother     bladder  . Breast cancer Neg Hx   . Colon cancer Neg Hx   . Anesthesia problems Neg Hx   . Hypotension Neg Hx   . Malignant hyperthermia Neg Hx   . Pseudochol deficiency Neg Hx   . Stroke Brother   . Cancer Brother     History   Social History  .  Marital Status: Married    Spouse Name: N/A    Number of Children: N/A  . Years of Education: N/A   Occupational History  . works partime in office    Social History Main Topics  . Smoking status: Never Smoker   . Smokeless tobacco: Never Used  . Alcohol Use: No  . Drug Use: No  . Sexual Activity: Yes    Birth Control/ Protection: Surgical   Other Topics Concern  . Not on file   Social History Narrative  . No narrative on file    Current Outpatient Prescriptions on File Prior to Visit  Medication Sig Dispense Refill  . amLODipine (NORVASC) 10 MG tablet TAKE ONE TABLET BY MOUTH IN THE MORNING  30 tablet  0  . aspirin 81 MG tablet Take 81 mg by mouth daily.      . Calcium-Vitamin D-Vitamin K 500-100-40 MG-UNT-MCG CHEW Chew 3 capsules by mouth daily.      . isosorbide mononitrate (IMDUR) 60 MG 24 hr tablet TAKE ONE-HALF TABLET BY MOUTH ONCE DAILY  30 tablet  5  . KLOR-CON M20 20 MEQ tablet TAKE ONE TABLET BY MOUTH EVERY DAY  30 tablet  9  . losartan-hydrochlorothiazide (HYZAAR) 100-25 MG per tablet TAKE ONE TABLET BY  MOUTH ONCE DAILY  90 tablet  3  . magnesium oxide (MAG-OX) 400 MG tablet Take 400 mg by mouth 2 (two) times daily.       . metoprolol succinate (TOPROL-XL) 50 MG 24 hr tablet Take 1 tablet (50 mg total) by mouth 2 (two) times daily. Take with or immediately following a meal.  180 tablet  1  . omeprazole (PRILOSEC) 40 MG capsule Take 40 mg by mouth 2 (two) times daily.       . phenazopyridine (PYRIDIUM) 200 MG tablet Take 1 tablet (200 mg total) by mouth 3 (three) times daily as needed for pain.  9 tablet  0  . pramipexole (MIRAPEX) 0.25 MG tablet Take 1 tablet (0.25 mg total) by mouth 2 (two) times daily.  180 tablet  1  . simvastatin (ZOCOR) 20 MG tablet TAKE ONE TABLET BY MOUTH AT BEDTIME  30 tablet  3  . [DISCONTINUED] nitroGLYCERIN (NITROSTAT) 0.4 MG SL tablet Place 0.4 mg under the tongue every 5 (five) minutes as needed.         No current facility-administered medications on file prior to visit.    No Known Allergies  Review of Systems  Review of Systems  Constitutional: Negative for fever and malaise/fatigue.  HENT: Negative for congestion.   Eyes: Negative for discharge.  Respiratory: Negative for shortness of breath.   Cardiovascular: Negative for chest pain, palpitations and leg swelling.  Gastrointestinal: Positive for abdominal pain. Negative for nausea and diarrhea.  Genitourinary: Positive for dysuria, urgency and frequency.  Musculoskeletal: Negative for falls.  Skin: Negative for rash.  Neurological: Negative for loss of consciousness and headaches.  Endo/Heme/Allergies: Negative for polydipsia.  Psychiatric/Behavioral: Negative for depression and suicidal ideas. The patient is not nervous/anxious and does not have insomnia.     Objective  BP 152/84  Pulse 50  Temp(Src) 97.6 F (36.4 C) (Oral)  Ht 5\' 2"  (1.575 m)  Wt 172 lb (78.019 kg)  BMI 31.45 kg/m2  SpO2 97%  Physical Exam  Physical Exam  Constitutional: She is oriented to person, place, and time and  well-developed, well-nourished, and in no distress. No distress.  HENT:  Head: Normocephalic and atraumatic.  Eyes: Conjunctivae are normal.  Neck: Neck supple. No thyromegaly present.  Cardiovascular: Normal rate, regular rhythm and normal heart sounds.   No murmur heard. Pulmonary/Chest: Effort normal and breath sounds normal. She has no wheezes.  Abdominal: She exhibits no distension and no mass.  Musculoskeletal: She exhibits no edema.  Lymphadenopathy:    She has no cervical adenopathy.  Neurological: She is alert and oriented to person, place, and time.  Skin: Skin is warm and dry. No rash noted. She is not diaphoretic.  Psychiatric: Memory, affect and judgment normal.    Lab Results  Component Value Date   TSH 1.637 01/04/2012   Lab Results  Component Value Date   WBC 5.1 04/24/2013   HGB 13.0 04/24/2013   HCT 38.0 04/24/2013   MCV 90.7 04/24/2013   PLT 129* 04/24/2013   Lab Results  Component Value Date   CREATININE 0.88 04/24/2013   BUN 24* 04/24/2013   NA 140 04/24/2013   K 4.3 04/24/2013   CL 105 04/24/2013   CO2 27 04/24/2013   Lab Results  Component Value Date   ALT 24 02/18/2013   AST 22 02/18/2013   ALKPHOS 84 02/18/2013   BILITOT 0.9 02/18/2013   Lab Results  Component Value Date   CHOL 146 02/18/2013   Lab Results  Component Value Date   HDL 47.70 02/18/2013   Lab Results  Component Value Date   LDLCALC 69 02/18/2013   Lab Results  Component Value Date   TRIG 145.0 02/18/2013   Lab Results  Component Value Date   CHOLHDL 3 02/18/2013     Assessment & Plan  UTI (lower urinary tract infection) Recurrent. Started on Ciprofloxacin and probiotics. Increase hydration  HYPERTENSION Mild elevation with acute illness. No changes today

## 2013-10-25 NOTE — Assessment & Plan Note (Signed)
Mild elevation with acute illness. No changes today

## 2013-10-26 ENCOUNTER — Encounter: Payer: Self-pay | Admitting: Family Medicine

## 2013-10-26 DIAGNOSIS — N390 Urinary tract infection, site not specified: Secondary | ICD-10-CM

## 2013-10-26 LAB — CULTURE, URINE COMPREHENSIVE: Colony Count: >=100000

## 2013-10-26 NOTE — Telephone Encounter (Signed)
Please advise 

## 2013-10-27 ENCOUNTER — Other Ambulatory Visit: Payer: Self-pay

## 2013-10-27 DIAGNOSIS — Z1231 Encounter for screening mammogram for malignant neoplasm of breast: Secondary | ICD-10-CM

## 2013-10-28 LAB — URINALYSIS
BILIRUBIN URINE: NEGATIVE
Glucose, UA: NEGATIVE mg/dL
HGB URINE DIPSTICK: NEGATIVE
Ketones, ur: NEGATIVE mg/dL
Leukocytes, UA: NEGATIVE
NITRITE: NEGATIVE
PROTEIN: NEGATIVE mg/dL
SPECIFIC GRAVITY, URINE: 1.01 (ref 1.005–1.030)
Urobilinogen, UA: 0.2 mg/dL (ref 0.0–1.0)
pH: 6.5 (ref 5.0–8.0)

## 2013-10-29 DIAGNOSIS — M67919 Unspecified disorder of synovium and tendon, unspecified shoulder: Secondary | ICD-10-CM | POA: Diagnosis not present

## 2013-10-29 DIAGNOSIS — M25519 Pain in unspecified shoulder: Secondary | ICD-10-CM | POA: Diagnosis not present

## 2013-10-29 DIAGNOSIS — M25559 Pain in unspecified hip: Secondary | ICD-10-CM | POA: Diagnosis not present

## 2013-10-29 DIAGNOSIS — M25569 Pain in unspecified knee: Secondary | ICD-10-CM | POA: Diagnosis not present

## 2013-10-29 DIAGNOSIS — M719 Bursopathy, unspecified: Secondary | ICD-10-CM | POA: Diagnosis not present

## 2013-11-02 ENCOUNTER — Ambulatory Visit: Payer: Medicare Other

## 2013-11-04 ENCOUNTER — Ambulatory Visit
Admission: RE | Admit: 2013-11-04 | Discharge: 2013-11-04 | Disposition: A | Discharge status: Home / Self Care | Payer: Medicare Other | Source: Ambulatory Visit

## 2013-11-04 DIAGNOSIS — Z1231 Encounter for screening mammogram for malignant neoplasm of breast: Secondary | ICD-10-CM

## 2013-11-06 LAB — HM MAMMOGRAPHY: HM MAMMO: NORMAL

## 2013-11-10 ENCOUNTER — Telehealth: Payer: Self-pay | Admitting: *Deleted

## 2013-11-10 DIAGNOSIS — I10 Essential (primary) hypertension: Secondary | ICD-10-CM

## 2013-11-10 DIAGNOSIS — E785 Hyperlipidemia, unspecified: Secondary | ICD-10-CM

## 2013-11-10 DIAGNOSIS — N390 Urinary tract infection, site not specified: Secondary | ICD-10-CM | POA: Diagnosis not present

## 2013-11-10 DIAGNOSIS — E119 Type 2 diabetes mellitus without complications: Secondary | ICD-10-CM

## 2013-11-10 DIAGNOSIS — N289 Disorder of kidney and ureter, unspecified: Secondary | ICD-10-CM

## 2013-11-10 DIAGNOSIS — Z0000 Encounter for general adult medical examination without abnormal findings: Secondary | ICD-10-CM | POA: Diagnosis not present

## 2013-11-10 LAB — CBC
HEMATOCRIT: 43.1 % (ref 36.0–46.0)
Hemoglobin: 14.5 g/dL (ref 12.0–15.0)
MCH: 30.1 pg (ref 26.0–34.0)
MCHC: 33.6 g/dL (ref 30.0–36.0)
MCV: 89.6 fL (ref 78.0–100.0)
Platelets: 199 10*3/uL (ref 150–400)
RBC: 4.81 MIL/uL (ref 3.87–5.11)
RDW: 13.8 % (ref 11.5–15.5)
WBC: 9.8 10*3/uL (ref 4.0–10.5)

## 2013-11-10 LAB — RENAL FUNCTION PANEL
Albumin: 4.1 g/dL (ref 3.5–5.2)
BUN: 23 mg/dL (ref 6–23)
CHLORIDE: 100 meq/L (ref 96–112)
CO2: 29 meq/L (ref 19–32)
Calcium: 9.4 mg/dL (ref 8.4–10.5)
Creat: 0.95 mg/dL (ref 0.50–1.10)
Glucose, Bld: 116 mg/dL — ABNORMAL HIGH (ref 70–99)
Phosphorus: 3.3 mg/dL (ref 2.3–4.6)
Potassium: 4 mEq/L (ref 3.5–5.3)
Sodium: 137 mEq/L (ref 135–145)

## 2013-11-10 LAB — LIPID PANEL
CHOLESTEROL: 163 mg/dL (ref 0–200)
HDL: 64 mg/dL (ref >39)
LDL Cholesterol: 66 mg/dL (ref 0–99)
TRIGLYCERIDES: 165 mg/dL — AB (ref <150)
Total CHOL/HDL Ratio: 2.5 Ratio
VLDL: 33 mg/dL (ref 0–40)

## 2013-11-10 LAB — TSH: TSH: 1.353 u[IU]/mL (ref 0.350–4.500)

## 2013-11-10 LAB — HEPATIC FUNCTION PANEL
ALBUMIN: 4.1 g/dL (ref 3.5–5.2)
ALT: 25 U/L (ref 0–35)
AST: 16 U/L (ref 0–37)
Alkaline Phosphatase: 92 U/L (ref 39–117)
BILIRUBIN DIRECT: 0.1 mg/dL (ref 0.0–0.3)
Indirect Bilirubin: 0.5 mg/dL (ref 0.2–1.2)
TOTAL PROTEIN: 6.3 g/dL (ref 6.0–8.3)
Total Bilirubin: 0.6 mg/dL (ref 0.2–1.2)

## 2013-11-10 LAB — HEMOGLOBIN A1C
Hgb A1c MFr Bld: 7.4 % — ABNORMAL HIGH (ref <5.7)
MEAN PLASMA GLUCOSE: 166 mg/dL — AB (ref <117)

## 2013-11-10 NOTE — Telephone Encounter (Signed)
Pt presented to the lab. Pt was advised to give repeat urine culture per pt email of 10/26/13. Also needs labs prior to upcoming physical per 10/02/13 office note as below:  Return in about 7 weeks (around 11/20/2013) for medicare wellness, subsequent, lipid, renal, cbc, tsh, hepatic, hgba1c prior.

## 2013-11-11 ENCOUNTER — Telehealth: Payer: Self-pay | Admitting: Cardiovascular Disease

## 2013-11-11 LAB — URINE CULTURE
Colony Count: NO GROWTH
Organism ID, Bacteria: NO GROWTH

## 2013-11-11 NOTE — Telephone Encounter (Signed)
New problem    Pt is having sever leg and feet cramps at night when lying down.  Pt would like to know if she should double up on her Potasium and if so pt needs a scripit called in she does not have enough.

## 2013-11-11 NOTE — Telephone Encounter (Signed)
Returned call to patient she stated has been having severe cramping in lower legs and feet at night when she lies down for the past week.Stated yesterday she had severe cramp in back of upper thigh during the afternoon.Stated today back of thigh very sore.Stated she would like to know what to do,wanting to know if she should increase potassium.Dr.Cooper out of office today will send message to him for advice.

## 2013-11-13 NOTE — Telephone Encounter (Signed)
Left message on pt's mobile phone to contact the office.

## 2013-11-13 NOTE — Telephone Encounter (Signed)
I spoke with the pt and she recently had a normal potassium on 11/10/13. The pt will try tonic water and calf stretches. If the pt's cramps continue then we will draw a CK. The pt will call back if she has further problems.

## 2013-11-13 NOTE — Telephone Encounter (Signed)
Should come in for BMET, CK level. Push fluids.

## 2013-11-16 DIAGNOSIS — H2510 Age-related nuclear cataract, unspecified eye: Secondary | ICD-10-CM | POA: Diagnosis not present

## 2013-11-16 DIAGNOSIS — H269 Unspecified cataract: Secondary | ICD-10-CM | POA: Diagnosis not present

## 2013-11-16 DIAGNOSIS — Z961 Presence of intraocular lens: Secondary | ICD-10-CM | POA: Diagnosis not present

## 2013-11-16 DIAGNOSIS — H5200 Hypermetropia, unspecified eye: Secondary | ICD-10-CM | POA: Diagnosis not present

## 2013-11-16 DIAGNOSIS — Z9849 Cataract extraction status, unspecified eye: Secondary | ICD-10-CM | POA: Diagnosis not present

## 2013-11-16 HISTORY — PX: EYE SURGERY: SHX253

## 2013-11-17 ENCOUNTER — Other Ambulatory Visit: Payer: Self-pay | Admitting: Physician Assistant

## 2013-11-17 ENCOUNTER — Ambulatory Visit (INDEPENDENT_AMBULATORY_CARE_PROVIDER_SITE_OTHER): Payer: Medicare Other | Admitting: Family Medicine

## 2013-11-17 ENCOUNTER — Encounter: Payer: Self-pay | Admitting: Family Medicine

## 2013-11-17 VITALS — BP 146/80 | HR 63 | Temp 97.9°F | Ht 63.0 in | Wt 164.0 lb

## 2013-11-17 DIAGNOSIS — Z0000 Encounter for general adult medical examination without abnormal findings: Secondary | ICD-10-CM | POA: Diagnosis not present

## 2013-11-17 DIAGNOSIS — Z23 Encounter for immunization: Secondary | ICD-10-CM

## 2013-11-17 DIAGNOSIS — I10 Essential (primary) hypertension: Secondary | ICD-10-CM

## 2013-11-17 DIAGNOSIS — E785 Hyperlipidemia, unspecified: Secondary | ICD-10-CM

## 2013-11-17 DIAGNOSIS — I2510 Atherosclerotic heart disease of native coronary artery without angina pectoris: Secondary | ICD-10-CM | POA: Diagnosis not present

## 2013-11-17 DIAGNOSIS — H269 Unspecified cataract: Secondary | ICD-10-CM

## 2013-11-17 DIAGNOSIS — E119 Type 2 diabetes mellitus without complications: Secondary | ICD-10-CM

## 2013-11-17 MED ORDER — ZOSTER VACCINE LIVE 19400 UNT/0.65ML ~~LOC~~ SOLR: 0.6500 mL | Freq: Once | SUBCUTANEOUS | Status: DC

## 2013-11-17 NOTE — Patient Instructions (Signed)
Morning sugars 80 to 130 After eating 90 to 150   Basic Carbohydrate Counting Basic carbohydrate counting is a way to plan meals. It is done by counting the amount of carbohydrate in foods. Foods that have carbohydrates are starches (grains, beans, starchy vegetables) and sweets. Eating carbohydrates increases blood glucose (sugar) levels. People with diabetes use carbohydrate counting to help keep their blood glucose at a normal level.  COUNTING CARBOHYDRATES IN FOODS The first step in counting carbohydrates is to learn how many carbohydrate servings you should have in every meal. A dietitian can plan this for you. After learning the amount of carbohydrates to include in your meal plan, you can start to choose the carbohydrate-containing foods you want to eat.  There are 2 ways to identify the amount of carbohydrates in the foods you eat.  Read the Nutrition Facts panel on food labels. You need 2 pieces of information from the Nutrition Facts panel to count carbohydrates this way:  Serving size.  Total carbohydrate (in grams). Decide how many servings you will be eating. If it is 1 serving, you will be eating the amount of carbohydrate listed on the panel. If you will be eating 2 servings, you will be eating double the amount of carbohydrate listed on the panel.   Learn serving sizes. A serving size of most carbohydrate-containing foods is about 15 grams (g). Listed below are single serving sizes of common carbohydrate-containing foods:  1 slice bread.   cup unsweetened, dry cereal.   cup hot cereal.   cup rice.   cup mashed potatoes.   cup pasta.  1 cup fresh fruit.   cup canned fruit.  1 cup milk (whole, 2%, or skim).   cup starchy vegetables (peas, corn, or potatoes). Counting carbohydrates this way is similar to looking on the Nutrition Facts panel. Decide how many servings you will eat first. Multiply the number of servings you eat by 15 g. For example, if you have 2  cups of strawberries, you had 2 servings. That means you had 30 g of carbohydrate (2 servings x 15 g = 30 g). CALCULATING CARBOHYDRATES IN A MEAL Sample dinner  3 oz chicken breast.   cup brown rice.   cup corn.  1 cup fat-free milk.  1 cup strawberries with sugar-free whipped topping. Carbohydrate calculation First, identify the foods that contain carbohydrate:  Rice.  Corn.  Milk.  Strawberries. Calculate the number of servings eaten:  2 servings rice.  1 serving corn.  1 serving milk.  1 serving strawberries. Multiply the number of servings by 15 g:  2 servings rice x 15 g = 30 g.  1 serving corn x 15 g = 15 g.  1 serving milk x 15 g = 15 g.  1 serving strawberries x 15 g = 15 g. Add the amounts to find the total carbohydrates eaten: 30 g + 15 g + 15 g + 15 g = 75 g carbohydrate eaten at dinner. Document Released: 06/25/2005 Document Revised: 09/17/2011 Document Reviewed: 05/11/2011 [facility] Patient Information 2014 [place], [place].

## 2013-11-17 NOTE — Progress Notes (Signed)
Pre visit review using our clinic review tool, if applicable. No additional management support is needed unless otherwise documented below in the visit note. 

## 2013-11-22 ENCOUNTER — Encounter: Payer: Self-pay | Admitting: Family Medicine

## 2013-11-22 DIAGNOSIS — Z0000 Encounter for general adult medical examination without abnormal findings: Secondary | ICD-10-CM | POA: Insufficient documentation

## 2013-11-22 NOTE — Assessment & Plan Note (Addendum)
Patient denies any difficulties at home. No trouble with ADLs, depression or falls. No recent changes to vision or hearing. Is UTD with immunizations. Is UTD with screening. Discussed Advanced Directives, patient agrees to bring [place] copies of documents if can. Encouraged heart healthy diet, exercise as tolerated and adequate sleep MGM 2 weeks ago, pap next month. HCP is husband Petronella Shuford. Pneumonia shot today

## 2013-11-22 NOTE — Assessment & Plan Note (Signed)
Improved some on recheck but did not take meds today. No changes today, minimize sodium and caffeine

## 2013-11-22 NOTE — Assessment & Plan Note (Signed)
Follows with cardiology, Dr Burt Knack, asymptomatic.

## 2013-11-22 NOTE — Assessment & Plan Note (Signed)
Tolerating statin, encouraged heart healthy diet, avoid trans fats, minimize simple carbs and saturated fats. Increase exercise as tolerated 

## 2013-11-22 NOTE — Assessment & Plan Note (Signed)
hgba1c acceptable, minimize simple carbs. Increase exercise as tolerated. A1C will need to be repeated in 3 months if no improvement will need to consider meds.

## 2013-11-22 NOTE — Progress Notes (Signed)
Patient ID: Heather [name], female   DOB: [birth year], 79 y.o.   MRN: [id number] Heather [name] [id number] Dec 19, 1934 11/22/2013      Progress Note-Follow Up  Subjective  Chief Complaint  Chief Complaint  Patient presents with  . Annual Exam    medicare wellness  . Injections    prevnar    HPI  Patient is a 79 year old female in today for routine medical care. She. She had a cataract removed from her right eye yesterday and tolerated it well. She has increased portions and is eating less carbs and more protein. No recent illness. Denies CP/palp/SOB/HA/congestion/fevers/GI or GU c/o. Taking meds as prescribed   Past Medical History  Diagnosis Date  . Coronary artery disease     post percutaneous coronary intervention in 2006  . Hyperlipidemia   . Restless legs     takes mirapex daily  . Fatigue   . Persistent disorder of initiating or maintaining sleep   . Dermatitis   . S/P inguinal herniorrhaphy   . DJD (degenerative joint disease) of knee 09/18/2011    knees- both, elbow- R, ankles   . Hypertension     takes medications daily  . GERD (gastroesophageal reflux disease)     prilosec daily  . H/O dizziness     pre-syncope/due to dehydration  . Occasional tremors   . Diverticulitis     hx of  . Gastric ulcer     hx of  . Angina   . Atrial fibrillation   . Dysrhythmia, cardiac     "palpitations"  . Borderline diabetes   . H/O hiatal hernia   . Stroke 2000    TIA- series that have resolved-  . Hx of total knee replacement 11/12/11    left knee  . UTI (lower urinary tract infection) 10/04/2013  . Medicare annual wellness visit, subsequent 11/22/2013    Past Surgical History  Procedure Laterality Date  . Abdominal hysterectomy    . Knee arthroscopy      2 on left and 1 on right  . Foot surgery      left foot stress fracture repair  . Hernia repair      1989- esophageal hernia   . Upper gastrointestinal endoscopy  2013  . Total knee arthroplasty  11/12/2011   Procedure: TOTAL KNEE ARTHROPLASTY;  Surgeon: Lorn Junes, MD;  Location: [facility];  Service: Orthopedics;  Laterality: Left;  Dr Noemi Chapel wants 90 minutes for this case  . Brain surgery    . Coronary angioplasty  03/2005; 06/2005    "1" plus "1"= "2 total"  . Eye surgery  11-16-13    cataract -right eye    Family History  Problem Relation Age of Onset  . Arthritis      family hx of  . Hypertension      family hx of  . Other      family hx of cardiovascular disorder  . Stroke Father     family hx of M 1st degree relative <50  . Coronary artery disease Mother   . Heart disease Mother   . Depression Brother   . Breast cancer Neg Hx   . Colon cancer Neg Hx   . Anesthesia problems Neg Hx   . Hypotension Neg Hx   . Malignant hyperthermia Neg Hx   . Pseudochol deficiency Neg Hx   . Stroke Brother   . Diabetes Brother   . Cancer Brother     bladder with  mets  . Diabetes Daughter     borderline  . Hypertension Daughter     History   Social History  . Marital Status: Married    Spouse Name: N/A    Number of Children: N/A  . Years of Education: N/A   Occupational History  . works partime in office    Social History Main Topics  . Smoking status: Never Smoker   . Smokeless tobacco: Never Used  . Alcohol Use: No  . Drug Use: No  . Sexual Activity: Yes    Birth Control/ Protection: Surgical     Comment: lives with husband, no dietary restrictions   Other Topics Concern  . Not on file   Social History Narrative  . No narrative on file    Current Outpatient Prescriptions on File Prior to Visit  Medication Sig Dispense Refill  . aspirin 81 MG tablet Take 81 mg by mouth daily.      . Calcium-Vitamin D-Vitamin K 500-100-40 MG-UNT-MCG CHEW Chew 3 capsules by mouth daily.      . isosorbide mononitrate (IMDUR) 60 MG 24 hr tablet TAKE ONE-HALF TABLET BY MOUTH ONCE DAILY  30 tablet  5  . KLOR-CON M20 20 MEQ tablet TAKE ONE TABLET BY MOUTH EVERY DAY  30 tablet  9  .  losartan-hydrochlorothiazide (HYZAAR) 100-25 MG per tablet TAKE ONE TABLET BY MOUTH ONCE DAILY  90 tablet  3  . magnesium oxide (MAG-OX) 400 MG tablet Take 400 mg by mouth 2 (two) times daily.       . metoprolol succinate (TOPROL-XL) 50 MG 24 hr tablet Take 1 tablet (50 mg total) by mouth 2 (two) times daily. Take with or immediately following a meal.  180 tablet  1  . omeprazole (PRILOSEC) 40 MG capsule Take 40 mg by mouth 2 (two) times daily.       . pramipexole (MIRAPEX) 0.25 MG tablet Take 1 tablet (0.25 mg total) by mouth 2 (two) times daily.  180 tablet  1  . simvastatin (ZOCOR) 20 MG tablet TAKE ONE TABLET BY MOUTH AT BEDTIME  30 tablet  3  . [DISCONTINUED] nitroGLYCERIN (NITROSTAT) 0.4 MG SL tablet Place 0.4 mg under the tongue every 5 (five) minutes as needed.         No current facility-administered medications on file prior to visit.    No Known Allergies  Review of Systems  Review of Systems  Constitutional: Negative for fever, chills and malaise/fatigue.  HENT: Negative for congestion, hearing loss and nosebleeds.   Eyes: Negative for discharge.  Respiratory: Negative for cough, sputum production, shortness of breath and wheezing.   Cardiovascular: Negative for chest pain, palpitations and leg swelling.  Gastrointestinal: Negative for heartburn, nausea, vomiting, abdominal pain, diarrhea, constipation and blood in stool.  Genitourinary: Negative for dysuria, urgency, frequency and hematuria.  Musculoskeletal: Negative for back pain, falls and myalgias.  Skin: Negative for rash.  Neurological: Negative for dizziness, tremors, sensory change, focal weakness, loss of consciousness, weakness and headaches.  Endo/Heme/Allergies: Negative for polydipsia. Does not bruise/bleed easily.  Psychiatric/Behavioral: Negative for depression and suicidal ideas. The patient is not nervous/anxious and does not have insomnia.     Objective  BP 160/82  Pulse 63  Temp(Src) 97.9 F (36.6 C)  (Oral)  Ht 5\' 3"  (1.6 m)  Wt 164 lb 0.6 oz (74.408 kg)  BMI 29.07 kg/m2  SpO2 97%  Physical Exam  Physical Exam  Constitutional: She is oriented to person, place, and time and well-developed,  well-nourished, and in no distress. No distress.  HENT:  Head: Normocephalic and atraumatic.  Right Ear: External ear normal.  Left Ear: External ear normal.  Nose: Nose normal.  Mouth/Throat: Oropharynx is clear and moist. No oropharyngeal exudate.  Eyes: Conjunctivae are normal. Pupils are equal, round, and reactive to light. Right eye exhibits no discharge. Left eye exhibits no discharge. No scleral icterus.  Neck: Normal range of motion. Neck supple. No thyromegaly present.  Cardiovascular: Normal rate, regular rhythm, normal heart sounds and intact distal pulses.   Pulmonary/Chest: Effort normal and breath sounds normal. No respiratory distress. She has no wheezes. She has no rales.  Abdominal: Soft. Bowel sounds are normal. She exhibits no distension and no mass. There is no tenderness.  Musculoskeletal: Normal range of motion. She exhibits no edema and no tenderness.  Lymphadenopathy:    She has no cervical adenopathy.  Neurological: She is alert and oriented to person, place, and time. She has normal reflexes. No cranial nerve deficit. Coordination normal.  Skin: Skin is warm and dry. No rash noted. She is not diaphoretic.  Psychiatric: Mood, memory and affect normal.    Lab Results  Component Value Date   TSH 1.353 11/10/2013   Lab Results  Component Value Date   WBC 9.8 11/10/2013   HGB 14.5 11/10/2013   HCT 43.1 11/10/2013   MCV 89.6 11/10/2013   PLT 199 11/10/2013   Lab Results  Component Value Date   CREATININE 0.95 11/10/2013   BUN 23 11/10/2013   NA 137 11/10/2013   K 4.0 11/10/2013   CL 100 11/10/2013   CO2 29 11/10/2013   Lab Results  Component Value Date   ALT 25 11/10/2013   AST 16 11/10/2013   ALKPHOS 92 11/10/2013   BILITOT 0.6 11/10/2013   Lab Results  Component Value Date   CHOL  163 11/10/2013   Lab Results  Component Value Date   HDL 64 11/10/2013   Lab Results  Component Value Date   LDLCALC 66 11/10/2013   Lab Results  Component Value Date   TRIG 165* 11/10/2013   Lab Results  Component Value Date   CHOLHDL 2.5 11/10/2013     Assessment & Plan  DM (diabetes mellitus) hgba1c acceptable, minimize simple carbs. Increase exercise as tolerated. A1C will need to be repeated in 3 months if no improvement will need to consider meds.  HYPERLIPIDEMIA Tolerating statin, encouraged heart healthy diet, avoid trans fats, minimize simple carbs and saturated fats. Increase exercise as tolerated  CORONARY ATHEROSCLEROSIS NATIVE CORONARY ARTERY Follows with cardiology, Dr Burt Knack, asymptomatic.  Cataracts, bilateral Right eye cataract extracted yesterday is doing well  Medicare annual wellness visit, subsequent Patient denies any difficulties at home. No trouble with ADLs, depression or falls. No recent changes to vision or hearing. Is UTD with immunizations. Is UTD with screening. Discussed Advanced Directives, patient agrees to bring [place] copies of documents if can. Encouraged heart healthy diet, exercise as tolerated and adequate sleep MGM 2 weeks ago, pap next month. HCP is husband Macel Yearsley. Pneumonia shot today  HYPERTENSION Improved some on recheck but did not take meds today. No changes today, minimize sodium and caffeine

## 2013-11-22 NOTE — Assessment & Plan Note (Signed)
Right eye cataract extracted yesterday is doing well

## 2013-12-07 DIAGNOSIS — H269 Unspecified cataract: Secondary | ICD-10-CM | POA: Diagnosis not present

## 2013-12-07 DIAGNOSIS — H2510 Age-related nuclear cataract, unspecified eye: Secondary | ICD-10-CM | POA: Diagnosis not present

## 2013-12-07 DIAGNOSIS — Z961 Presence of intraocular lens: Secondary | ICD-10-CM | POA: Diagnosis not present

## 2013-12-07 DIAGNOSIS — H5200 Hypermetropia, unspecified eye: Secondary | ICD-10-CM | POA: Diagnosis not present

## 2013-12-07 DIAGNOSIS — H43819 Vitreous degeneration, unspecified eye: Secondary | ICD-10-CM | POA: Diagnosis not present

## 2013-12-08 ENCOUNTER — Telehealth: Payer: Self-pay

## 2013-12-08 MED ORDER — ACCU-CHEK AVIVA PLUS W/DEVICE KIT: PACK | Status: DC

## 2013-12-08 MED ORDER — GLUCOSE BLOOD VI STRP: ORAL_STRIP | Status: DC

## 2013-12-08 NOTE — Telephone Encounter (Signed)
RX sent to pharmacy per request of wanting accu chek aviva

## 2013-12-14 DIAGNOSIS — Z961 Presence of intraocular lens: Secondary | ICD-10-CM | POA: Diagnosis not present

## 2013-12-14 DIAGNOSIS — H2510 Age-related nuclear cataract, unspecified eye: Secondary | ICD-10-CM | POA: Diagnosis not present

## 2013-12-14 DIAGNOSIS — H43819 Vitreous degeneration, unspecified eye: Secondary | ICD-10-CM | POA: Diagnosis not present

## 2013-12-14 DIAGNOSIS — Z01419 Encounter for gynecological examination (general) (routine) without abnormal findings: Secondary | ICD-10-CM | POA: Diagnosis not present

## 2013-12-14 DIAGNOSIS — N318 Other neuromuscular dysfunction of bladder: Secondary | ICD-10-CM | POA: Diagnosis not present

## 2013-12-14 DIAGNOSIS — H5200 Hypermetropia, unspecified eye: Secondary | ICD-10-CM | POA: Diagnosis not present

## 2013-12-16 ENCOUNTER — Other Ambulatory Visit: Payer: Self-pay | Admitting: Cardiovascular Disease

## 2013-12-26 ENCOUNTER — Other Ambulatory Visit: Payer: Self-pay | Admitting: Cardiovascular Disease

## 2013-12-28 ENCOUNTER — Encounter: Payer: Self-pay | Admitting: Family Medicine

## 2014-01-01 MED ORDER — ONETOUCH ULTRA SYSTEM W/DEVICE KIT: 1.0000 | PACK | Freq: Once | Status: DC

## 2014-01-05 ENCOUNTER — Other Ambulatory Visit: Payer: Self-pay

## 2014-01-05 MED ORDER — GLUCOSE BLOOD VI STRP: ORAL_STRIP | Status: DC

## 2014-01-05 MED ORDER — ONETOUCH DELICA LANCETS FINE MISC: Status: DC

## 2014-01-18 ENCOUNTER — Other Ambulatory Visit: Payer: Self-pay | Admitting: Cardiovascular Disease

## 2014-01-20 ENCOUNTER — Other Ambulatory Visit: Payer: Self-pay | Admitting: Cardiovascular Disease

## 2014-01-25 DIAGNOSIS — J189 Pneumonia, unspecified organism: Secondary | ICD-10-CM | POA: Diagnosis not present

## 2014-02-01 DIAGNOSIS — N318 Other neuromuscular dysfunction of bladder: Secondary | ICD-10-CM | POA: Diagnosis not present

## 2014-02-04 ENCOUNTER — Encounter: Payer: Self-pay | Admitting: Physician Assistant

## 2014-02-04 ENCOUNTER — Ambulatory Visit (INDEPENDENT_AMBULATORY_CARE_PROVIDER_SITE_OTHER): Payer: Medicare Other | Admitting: Physician Assistant

## 2014-02-04 VITALS — BP 118/78 | HR 72 | Temp 98.2°F | Resp 16 | Ht 63.0 in | Wt 164.5 lb

## 2014-02-04 DIAGNOSIS — I2510 Atherosclerotic heart disease of native coronary artery without angina pectoris: Secondary | ICD-10-CM | POA: Diagnosis not present

## 2014-02-04 DIAGNOSIS — J189 Pneumonia, unspecified organism: Secondary | ICD-10-CM

## 2014-02-04 DIAGNOSIS — R400 Somnolence: Secondary | ICD-10-CM

## 2014-02-04 DIAGNOSIS — G4710 Hypersomnia, unspecified: Secondary | ICD-10-CM | POA: Diagnosis not present

## 2014-02-04 MED ORDER — AZITHROMYCIN 250 MG PO TABS: ORAL_TABLET | ORAL | Status: DC

## 2014-02-04 NOTE — Progress Notes (Signed)
Pre visit review using our clinic review tool, if applicable. No additional management support is needed unless otherwise documented below in the visit note/SLS  

## 2014-02-04 NOTE — Progress Notes (Signed)
Patient presents to clinic today for UC follow-up.  Patient was seen at [facility] [place] 1 week ago where she was diagnosed with double Pneumonia via CXR.  Patient was given Rx for Levaquin.  Patient endorses taking medication as directed.  Endorses improvement in symptoms.  Patient denies fever, chills, sweats, SOB or pleuritic chest pain.  Still having productive cough.  Denies new symptom.  Has still felt a little fatigue but this is improving.   Patient also wishes to talk about chronic insomnia.  Patient endorses excessive daytime somnolence.  Is also overweight and with hx of chronic HTN.  Endorses snoring.  Denies waking up with difficulty breathing.  Has difficulty falling asleep but not maintaining sleep.  Has tried melatonin in the past with no improvement in symptoms.   Past Medical History  Diagnosis Date  . Coronary artery disease     post percutaneous coronary intervention in 2006  . Hyperlipidemia   . Restless legs     takes mirapex daily  . Fatigue   . Persistent disorder of initiating or maintaining sleep   . Dermatitis   . S/P inguinal herniorrhaphy   . DJD (degenerative joint disease) of knee 09/18/2011    knees- both, elbow- R, ankles   . Hypertension     takes medications daily  . GERD (gastroesophageal reflux disease)     prilosec daily  . H/O dizziness     pre-syncope/due to dehydration  . Occasional tremors   . Diverticulitis     hx of  . Gastric ulcer     hx of  . Angina   . Atrial fibrillation   . Dysrhythmia, cardiac     "palpitations"  . Borderline diabetes   . H/O hiatal hernia   . Stroke 2000    TIA- series that have resolved-  . Hx of total knee replacement 11/12/11    left knee  . UTI (lower urinary tract infection) 10/04/2013  . Medicare annual wellness visit, subsequent 11/22/2013    Current Outpatient Prescriptions on File Prior to Visit  Medication Sig Dispense Refill  . aspirin 81 MG tablet Take 81 mg by mouth daily.      . Blood  Glucose Monitoring Suppl (ONE TOUCH ULTRA SYSTEM KIT) W/DEVICE KIT 1 kit by Does not apply route once. DX: 250.00 Check sugars daily as needed  1 each  0  . Calcium-Vitamin D-Vitamin K 500-100-40 MG-UNT-MCG CHEW Chew 3 capsules by mouth daily.      Marland Kitchen glucose blood (ONE TOUCH TEST STRIPS) test strip Check BS daily and prn  DX: 250.00  100 each  1  . isosorbide mononitrate (IMDUR) 60 MG 24 hr tablet TAKE ONE-HALF TABLET BY MOUTH ONCE DAILY  30 tablet  5  . KLOR-CON M20 20 MEQ tablet TAKE ONE TABLET BY MOUTH ONCE DAILY  30 tablet  1  . losartan-hydrochlorothiazide (HYZAAR) 100-25 MG per tablet TAKE ONE TABLET BY MOUTH ONCE DAILY  90 tablet  3  . magnesium oxide (MAG-OX) 400 MG tablet Take 400 mg by mouth 2 (two) times daily.       . metoprolol succinate (TOPROL-XL) 50 MG 24 hr tablet TAKE ONE TABLET BY MOUTH TWICE DAILY, TAKE IMMEDIATELY FOLLOWING MEALS  60 tablet  0  . ONETOUCH DELICA LANCETS FINE MISC Check sugars daily and prn  DX 250.00  100 each  1  . pramipexole (MIRAPEX) 0.25 MG tablet Take 1 tablet (0.25 mg total) by mouth 2 (two) times daily.  180 tablet  1  . simvastatin (ZOCOR) 20 MG tablet TAKE ONE TABLET BY MOUTH AT BEDTIME  30 tablet  1  . zoster vaccine live, PF, (ZOSTAVAX) 30940 UNT/0.65ML injection Inject 19,400 Units into the skin once.  1 each  0  . [DISCONTINUED] nitroGLYCERIN (NITROSTAT) 0.4 MG SL tablet Place 0.4 mg under the tongue every 5 (five) minutes as needed.         No current facility-administered medications on file prior to visit.    No Known Allergies  Family History  Problem Relation Age of Onset  . Arthritis      family hx of  . Hypertension      family hx of  . Other      family hx of cardiovascular disorder  . Stroke Father     family hx of M 1st degree relative <50  . Coronary artery disease Mother   . Heart disease Mother   . Depression Brother   . Breast cancer Neg Hx   . Colon cancer Neg Hx   . Anesthesia problems Neg Hx   . Hypotension  Neg Hx   . Malignant hyperthermia Neg Hx   . Pseudochol deficiency Neg Hx   . Stroke Brother   . Diabetes Brother   . Cancer Brother     bladder with mets  . Diabetes Daughter     borderline  . Hypertension Daughter     History   Social History  . Marital Status: Married    Spouse Name: N/A    Number of Children: N/A  . Years of Education: N/A   Occupational History  . works partime in office    Social History Main Topics  . Smoking status: Never Smoker   . Smokeless tobacco: Never Used  . Alcohol Use: No  . Drug Use: No  . Sexual Activity: Yes    Birth Control/ Protection: Surgical     Comment: lives with husband, no dietary restrictions   Other Topics Concern  . None   Social History Narrative  . None    Review of Systems - See HPI.  All other ROS are negative.  BP 118/78  Pulse 72  Temp(Src) 98.2 F (36.8 C) (Oral)  Resp 16  Ht '5\' 3"'  (1.6 m)  Wt 164 lb 8 oz (74.617 kg)  BMI 29.15 kg/m2  SpO2 97%  Physical Exam  Vitals reviewed. Constitutional: She is oriented to person, place, and time and well-developed, well-nourished, and in no distress.  HENT:  Head: Normocephalic and atraumatic.  Right Ear: External ear normal.  Left Ear: External ear normal.  Nose: Nose normal.  Mouth/Throat: Oropharynx is clear and moist. No oropharyngeal exudate.  TM within normal limits bilaterally.  Eyes: Conjunctivae are normal. Pupils are equal, round, and reactive to light.  Neck: Neck supple.  Cardiovascular: Normal rate, regular rhythm, normal heart sounds and intact distal pulses.   Pulmonary/Chest: Effort normal. No respiratory distress. She has no wheezes. She has rales. She exhibits no tenderness.  Lymphadenopathy:    She has no cervical adenopathy.  Neurological: She is alert and oriented to person, place, and time.  Skin: Skin is warm and dry. No rash noted.  Psychiatric: Affect normal.    Recent Results (from the past 2160 hour(s))  HEPATIC FUNCTION  PANEL     Status: None   Collection Time    11/10/13  3:28 AM      Result Value Ref Range   Total Bilirubin 0.6  0.2 - 1.2 mg/dL   Bilirubin, Direct 0.1  0.0 - 0.3 mg/dL   Indirect Bilirubin 0.5  0.2 - 1.2 mg/dL   Alkaline Phosphatase 92  39 - 117 U/L   AST 16  0 - 37 U/L   ALT 25  0 - 35 U/L   Total Protein 6.3  6.0 - 8.3 g/dL   Albumin 4.1  3.5 - 5.2 g/dL  LIPID PANEL     Status: Abnormal   Collection Time    11/10/13  3:28 AM      Result Value Ref Range   Cholesterol 163  0 - 200 mg/dL   Comment: ATP III Classification:           < 200        mg/dL        Desirable          200 - 239     mg/dL        Borderline High          >= 240        mg/dL        High         Triglycerides 165 (*) <150 mg/dL   HDL 64  >39 mg/dL   Total CHOL/HDL Ratio 2.5     VLDL 33  0 - 40 mg/dL   LDL Cholesterol 66  0 - 99 mg/dL   Comment:       Total Cholesterol/HDL Ratio:CHD Risk                            Coronary Heart Disease Risk Table                                            Men       Women              1/2 Average Risk              3.4        3.3                  Average Risk              5.0        4.4               2X Average Risk              9.6        7.1               3X Average Risk             23.4       11.0     Use the calculated Patient Ratio above and the CHD Risk table      to determine the patient's CHD Risk.     ATP III Classification (LDL):           < 100        mg/dL         Optimal          100 - 129     mg/dL         Near or Above Optimal          130 - 159  mg/dL         Borderline High          160 - 189     mg/dL         High           > 190        mg/dL         Very High        HEMOGLOBIN A1C     Status: Abnormal   Collection Time    11/10/13  3:28 AM      Result Value Ref Range   Hemoglobin A1C 7.4 (*) <5.7 %   Comment:                                                                            According to the ADA Clinical Practice Recommendations for  2011, when     HbA1c is used as a screening test:             >=6.5%   Diagnostic of Diabetes Mellitus                (if abnormal result is confirmed)           5.7-6.4%   Increased risk of developing Diabetes Mellitus           References:Diagnosis and Classification of Diabetes Mellitus,Diabetes     GEZM,6294,76(LYYTK 1):S62-S69 and Standards of Medical Care in             Diabetes - 2011,Diabetes Care,2011,34 (Suppl 1):S11-S61.         Mean Plasma Glucose 166 (*) <117 mg/dL  CBC     Status: None   Collection Time    11/10/13  3:28 AM      Result Value Ref Range   WBC 9.8  4.0 - 10.5 K/uL   RBC 4.81  3.87 - 5.11 MIL/uL   Hemoglobin 14.5  12.0 - 15.0 g/dL   HCT 43.1  36.0 - 46.0 %   MCV 89.6  78.0 - 100.0 fL   MCH 30.1  26.0 - 34.0 pg   MCHC 33.6  30.0 - 36.0 g/dL   RDW 13.8  11.5 - 15.5 %   Platelets 199  150 - 400 K/uL  RENAL FUNCTION PANEL     Status: Abnormal   Collection Time    11/10/13  3:28 AM      Result Value Ref Range   Sodium 137  135 - 145 mEq/L   Potassium 4.0  3.5 - 5.3 mEq/L   Chloride 100  96 - 112 mEq/L   CO2 29  19 - 32 mEq/L   Glucose, Bld 116 (*) 70 - 99 mg/dL   BUN 23  6 - 23 mg/dL   Creat 0.95  0.50 - 1.10 mg/dL   Albumin 4.1  3.5 - 5.2 g/dL   Calcium 9.4  8.4 - 10.5 mg/dL   Phosphorus 3.3  2.3 - 4.6 mg/dL  TSH     Status: None   Collection Time    11/10/13  3:28 AM      Result Value Ref Range   TSH 1.353  0.350 - 4.500 uIU/mL  URINE CULTURE  Status: None   Collection Time    11/10/13  3:30 AM      Result Value Ref Range   Colony Count NO GROWTH     Organism ID, Bacteria NO GROWTH      Assessment/Plan: CAP (community acquired pneumonia) Has finished round of Levaquin given by UC.  Some residual crackles of LLL. Will Rx Azithromycin. Continue supportive measures.  Follow-up in 1 week if symptoms are not resolved.  Somnolence, daytime With some signs/symptoms of sleep apnea.  Will refer to Pulmonology for evaluation for sleep  study.

## 2014-02-04 NOTE — Patient Instructions (Signed)
Please take antibiotic as directed.  Continue albuterol as directed, if needed for chest tightness.  Use Delsym for cough.  Increase your fluid intake.  Rest.  Place a humidifier in the bedroom.  You will be contacted by a Sleep Specialist for further evaluation of your daytime somnolence.  Pneumonia Pneumonia is an infection of the lungs.  CAUSES Pneumonia may be caused by bacteria or a virus. Usually, these infections are caused by breathing infectious particles into the lungs (respiratory tract). SIGNS AND SYMPTOMS   Cough.  Fever.  Chest pain.  Increased rate of breathing.  Wheezing.  Mucus production. DIAGNOSIS  If you have the common symptoms of pneumonia, your health care provider will typically confirm the diagnosis with a chest X-ray. The X-ray will show an abnormality in the lung (pulmonary infiltrate) if you have pneumonia. Other tests of your blood, urine, or sputum may be done to find the specific cause of your pneumonia. Your health care provider may also do tests (blood gases or pulse oximetry) to see how well your lungs are working. TREATMENT  Some forms of pneumonia may be spread to other people when you cough or sneeze. You may be asked to wear a mask before and during your exam. Pneumonia that is caused by bacteria is treated with antibiotic medicine. Pneumonia that is caused by the influenza virus may be treated with an antiviral medicine. Most other viral infections must run their course. These infections will not respond to antibiotics.  HOME CARE INSTRUCTIONS   Cough suppressants may be used if you are losing too much rest. However, coughing protects you by clearing your lungs. You should avoid using cough suppressants if you can.  Your health care provider may have prescribed medicine if he or she thinks your pneumonia is caused by bacteria or influenza. Finish your medicine even if you start to feel better.  Your health care provider may also prescribe an  expectorant. This loosens the mucus to be coughed up.  Take medicines only as directed by your health care provider.  Do not smoke. Smoking is a common cause of bronchitis and can contribute to pneumonia. If you are a smoker and continue to smoke, your cough may last several weeks after your pneumonia has cleared.  A cold steam vaporizer or humidifier in your room or home may help loosen mucus.  Coughing is often worse at night. Sleeping in a semi-upright position in a recliner or using a couple pillows under your head will help with this.  Get rest as you feel it is needed. Your body will usually let you know when you need to rest. PREVENTION A pneumococcal shot (vaccine) is available to prevent a common bacterial cause of pneumonia. This is usually suggested for:  People over 36 years old.  Patients on chemotherapy.  People with chronic lung problems, such as bronchitis or emphysema.  People with immune system problems. If you are over 65 or have a high risk condition, you may receive the pneumococcal vaccine if you have not received it before. In some countries, a routine influenza vaccine is also recommended. This vaccine can help prevent some cases of pneumonia.You may be offered the influenza vaccine as part of your care. If you smoke, it is time to quit. You may receive instructions on how to stop smoking. Your health care provider can provide medicines and counseling to help you quit. SEEK MEDICAL CARE IF: You have a fever. SEEK IMMEDIATE MEDICAL CARE IF:   Your illness becomes  worse. This is especially true if you are elderly or weakened from any other disease.  You cannot control your cough with suppressants and are losing sleep.  You begin coughing up blood.  You develop pain which is getting worse or is uncontrolled with medicines.  Any of the symptoms which initially brought you in for treatment are getting worse rather than better.  You develop shortness of breath  or chest pain. MAKE SURE YOU:   Understand these instructions.  Will watch your condition.  Will get help right away if you are not doing well or get worse. Document Released: 06/25/2005 Document Revised: 11/09/2013 Document Reviewed: 09/14/2010 [facility] Patient Information 2015 [place], [place]. This information is not intended to replace advice given to you by your health care provider. Make sure you discuss any questions you have with your health care provider.

## 2014-02-05 ENCOUNTER — Telehealth: Payer: Self-pay | Admitting: Family Medicine

## 2014-02-05 DIAGNOSIS — J189 Pneumonia, unspecified organism: Secondary | ICD-10-CM | POA: Insufficient documentation

## 2014-02-05 DIAGNOSIS — G4700 Insomnia, unspecified: Secondary | ICD-10-CM | POA: Insufficient documentation

## 2014-02-05 MED ORDER — BENZONATATE 100 MG PO CAPS: 200.0000 mg | ORAL_CAPSULE | Freq: Three times a day (TID) | ORAL | Status: DC | PRN

## 2014-02-05 NOTE — Telephone Encounter (Signed)
Saw Heather [name] yesterday, is having a terrible cough. Can something be called in? [facility] on [address]

## 2014-02-05 NOTE — Telephone Encounter (Signed)
Rx sent to pharmacy. Pt advised.

## 2014-02-05 NOTE — Assessment & Plan Note (Signed)
Has finished round of Levaquin given by UC.  Some residual crackles of LLL. Will Rx Azithromycin. Continue supportive measures.  Follow-up in 1 week if symptoms are not resolved.

## 2014-02-05 NOTE — Assessment & Plan Note (Signed)
With some signs/symptoms of sleep apnea.  Will refer to Pulmonology for evaluation for sleep study.

## 2014-02-05 NOTE — Telephone Encounter (Signed)
The only thing we can call in is the Tessalon Perles 200 mg caps, everything else has to be picked up. rx Tessalon Perles 200 mg caps 1 cap po tid prn cough, disp #30

## 2014-02-20 ENCOUNTER — Other Ambulatory Visit: Payer: Self-pay | Admitting: Cardiovascular Disease

## 2014-02-22 ENCOUNTER — Encounter: Payer: Self-pay | Admitting: Physician Assistant

## 2014-02-22 ENCOUNTER — Ambulatory Visit (HOSPITAL_BASED_OUTPATIENT_CLINIC_OR_DEPARTMENT_OTHER)
Admission: RE | Admit: 2014-02-22 | Discharge: 2014-02-22 | Disposition: A | Discharge status: Home / Self Care | Payer: Medicare Other | Source: Ambulatory Visit | Attending: Physician Assistant | Admitting: Physician Assistant

## 2014-02-22 ENCOUNTER — Ambulatory Visit (INDEPENDENT_AMBULATORY_CARE_PROVIDER_SITE_OTHER): Payer: Medicare Other | Admitting: Physician Assistant

## 2014-02-22 VITALS — BP 118/72 | HR 64 | Temp 97.8°F | Resp 16 | Ht 63.0 in | Wt 165.0 lb

## 2014-02-22 DIAGNOSIS — J189 Pneumonia, unspecified organism: Secondary | ICD-10-CM | POA: Diagnosis not present

## 2014-02-22 DIAGNOSIS — I2510 Atherosclerotic heart disease of native coronary artery without angina pectoris: Secondary | ICD-10-CM | POA: Diagnosis not present

## 2014-02-22 DIAGNOSIS — M546 Pain in thoracic spine: Secondary | ICD-10-CM | POA: Insufficient documentation

## 2014-02-22 DIAGNOSIS — R0989 Other specified symptoms and signs involving the circulatory and respiratory systems: Secondary | ICD-10-CM | POA: Diagnosis not present

## 2014-02-22 MED ORDER — HYDROCOD POLST-CHLORPHEN POLST 10-8 MG/5ML PO LQCR: 5.0000 mL | Freq: Two times a day (BID) | ORAL | Status: DC | PRN

## 2014-02-22 MED ORDER — DOXYCYCLINE HYCLATE 100 MG PO TABS: 100.0000 mg | ORAL_TABLET | Freq: Two times a day (BID) | ORAL | Status: AC

## 2014-02-22 NOTE — Patient Instructions (Addendum)
Please go downstairs for x-ray.  I will call you with your results.  Please take medications as directed.  Increase fluid intake.  Rest.  Saline nasal spray.  Keep humidifier in the bedroom.  Please use Tussionex for cough.

## 2014-02-22 NOTE — Progress Notes (Signed)
Pre visit review using our clinic review tool, if applicable. No additional management support is needed unless otherwise documented below in the visit note/SLS  

## 2014-02-23 NOTE — Progress Notes (Signed)
Patient presents to clinic today c/o 1 week of productive cough, chest tightness and fatigue.  Patient recently treated with 2 rounds of antibiotic for CAP. Denies chest pain, fever or chills, but endorses some mild aches.  Denies recent travel or sick contact.  Past Medical History  Diagnosis Date  . Coronary artery disease     post percutaneous coronary intervention in 2006  . Hyperlipidemia   . Restless legs     takes mirapex daily  . Fatigue   . Persistent disorder of initiating or maintaining sleep   . Dermatitis   . S/P inguinal herniorrhaphy   . DJD (degenerative joint disease) of knee 09/18/2011    knees- both, elbow- R, ankles   . Hypertension     takes medications daily  . GERD (gastroesophageal reflux disease)     prilosec daily  . H/O dizziness     pre-syncope/due to dehydration  . Occasional tremors   . Diverticulitis     hx of  . Gastric ulcer     hx of  . Angina   . Atrial fibrillation   . Dysrhythmia, cardiac     "palpitations"  . Borderline diabetes   . H/O hiatal hernia   . Stroke 2000    TIA- series that have resolved-  . Hx of total knee replacement 11/12/11    left knee  . UTI (lower urinary tract infection) 10/04/2013  . Medicare annual wellness visit, subsequent 11/22/2013    Current Outpatient Prescriptions on File Prior to Visit  Medication Sig Dispense Refill  . albuterol (PROVENTIL HFA;VENTOLIN HFA) 108 (90 BASE) MCG/ACT inhaler Inhale 2 puffs into the lungs every 6 (six) hours as needed for wheezing or shortness of breath (Pneumonia).      Marland Kitchen amLODipine (NORVASC) 10 MG tablet TAKE ONE-HALF (0.5) TABLET BY MOUTH IN THE MORNING      . aspirin 81 MG tablet Take 81 mg by mouth daily.      . Blood Glucose Monitoring Suppl (ONE TOUCH ULTRA SYSTEM KIT) W/DEVICE KIT 1 kit by Does not apply route once. DX: 250.00 Check sugars daily as needed  1 each  0  . Calcium-Vitamin D-Vitamin K 500-100-40 MG-UNT-MCG CHEW Chew 3 capsules by mouth daily.      Marland Kitchen glucose  blood (ONE TOUCH TEST STRIPS) test strip Check BS daily and prn  DX: 250.00  100 each  1  . isosorbide mononitrate (IMDUR) 60 MG 24 hr tablet TAKE ONE-HALF TABLET BY MOUTH ONCE DAILY  30 tablet  5  . KLOR-CON M20 20 MEQ tablet TAKE ONE TABLET BY MOUTH ONCE DAILY  30 tablet  0  . losartan-hydrochlorothiazide (HYZAAR) 100-25 MG per tablet TAKE ONE TABLET BY MOUTH ONCE DAILY  90 tablet  3  . magnesium oxide (MAG-OX) 400 MG tablet Take 400 mg by mouth 2 (two) times daily.       . metoprolol succinate (TOPROL-XL) 50 MG 24 hr tablet TAKE ONE TABLET BY MOUTH TWICE DAILY, TAKE IMMEDIATELY FOLLOWING MEALS  60 tablet  0  . omeprazole (PRILOSEC) 20 MG capsule Take 20 mg by mouth 2 (two) times daily before a meal.      . ONETOUCH DELICA LANCETS FINE MISC Check sugars daily and prn  DX 250.00  100 each  1  . pramipexole (MIRAPEX) 0.25 MG tablet Take 1 tablet (0.25 mg total) by mouth 2 (two) times daily.  180 tablet  1  . simvastatin (ZOCOR) 20 MG tablet TAKE ONE TABLET BY MOUTH AT  BEDTIME  30 tablet  1  . zoster vaccine live, PF, (ZOSTAVAX) 56387 UNT/0.65ML injection Inject 19,400 Units into the skin once.  1 each  0  . [DISCONTINUED] nitroGLYCERIN (NITROSTAT) 0.4 MG SL tablet Place 0.4 mg under the tongue every 5 (five) minutes as needed.         No current facility-administered medications on file prior to visit.    No Known Allergies  Family History  Problem Relation Age of Onset  . Arthritis      family hx of  . Hypertension      family hx of  . Other      family hx of cardiovascular disorder  . Stroke Father     family hx of M 1st degree relative <50  . Coronary artery disease Mother   . Heart disease Mother   . Depression Brother   . Breast cancer Neg Hx   . Colon cancer Neg Hx   . Anesthesia problems Neg Hx   . Hypotension Neg Hx   . Malignant hyperthermia Neg Hx   . Pseudochol deficiency Neg Hx   . Stroke Brother   . Diabetes Brother   . Cancer Brother     bladder with mets  .  Diabetes Daughter     borderline  . Hypertension Daughter     History   Social History  . Marital Status: Married    Spouse Name: N/A    Number of Children: N/A  . Years of Education: N/A   Occupational History  . works partime in office    Social History Main Topics  . Smoking status: Never Smoker   . Smokeless tobacco: Never Used  . Alcohol Use: No  . Drug Use: No  . Sexual Activity: Yes    Birth Control/ Protection: Surgical     Comment: lives with husband, no dietary restrictions   Other Topics Concern  . None   Social History Narrative  . None    Review of Systems - See HPI.  All other ROS are negative.  BP 118/72  Pulse 64  Temp(Src) 97.8 F (36.6 C) (Oral)  Resp 16  Ht '5\' 3"'  (1.6 m)  Wt 165 lb (74.844 kg)  BMI 29.24 kg/m2  SpO2 97%  Physical Exam  Vitals reviewed. Constitutional: She is oriented to person, place, and time and well-developed, well-nourished, and in no distress.  HENT:  Head: Normocephalic and atraumatic.  Right Ear: External ear normal.  Left Ear: External ear normal.  Nose: Nose normal.  Mouth/Throat: Oropharynx is clear and moist.  TM within normal limits bilaterally.  Eyes: Conjunctivae are normal.  Neck: Neck supple.  Cardiovascular: Normal rate, regular rhythm, normal heart sounds and intact distal pulses.   Pulmonary/Chest: Effort normal and breath sounds normal. No respiratory distress. She has no wheezes. She has no rales. She exhibits no tenderness.  Lymphadenopathy:    She has no cervical adenopathy.  Neurological: She is alert and oriented to person, place, and time.  Skin: Skin is warm and dry. No rash noted.  Psychiatric: Affect normal.    No results found for this or any previous visit (from the past 2160 hour(s)).  Assessment/Plan: CAP (community acquired pneumonia) Patient with symptoms of acute bacterial bronchitis.  However patient with recent pneumonia s/p rounds of Levaquin and Azithromycin.  Will obtain  CXR to rule out continued CAP.  Rx Doxycycline.  Increase fluids. Rx Tussionex.  Probiotic.  Humidifier in bedroom.  Return precautions discussed with patient.  If symptoms persist and CXR normal, may need CT and Pulmonology evaluation.

## 2014-02-23 NOTE — Assessment & Plan Note (Signed)
Patient with symptoms of acute bacterial bronchitis.  However patient with recent pneumonia s/p rounds of Levaquin and Azithromycin.  Will obtain CXR to rule out continued CAP.  Rx Doxycycline.  Increase fluids. Rx Tussionex.  Probiotic.  Humidifier in bedroom.  Return precautions discussed with patient.  If symptoms persist and CXR normal, may need CT and Pulmonology evaluation.

## 2014-02-25 ENCOUNTER — Other Ambulatory Visit: Payer: Self-pay | Admitting: *Deleted

## 2014-02-25 MED ORDER — METOPROLOL SUCCINATE ER 50 MG PO TB24: ORAL_TABLET | ORAL | Status: DC

## 2014-03-16 ENCOUNTER — Ambulatory Visit: Payer: Medicare Other | Admitting: Cardiovascular Disease

## 2014-03-18 ENCOUNTER — Other Ambulatory Visit: Payer: Self-pay | Admitting: Cardiovascular Disease

## 2014-03-25 ENCOUNTER — Other Ambulatory Visit: Payer: Self-pay | Admitting: Cardiovascular Disease

## 2014-03-29 ENCOUNTER — Other Ambulatory Visit: Payer: Self-pay | Admitting: Cardiovascular Disease

## 2014-03-30 ENCOUNTER — Encounter: Payer: Self-pay | Admitting: Pulmonary Disease

## 2014-03-30 ENCOUNTER — Ambulatory Visit (INDEPENDENT_AMBULATORY_CARE_PROVIDER_SITE_OTHER): Payer: Medicare Other | Admitting: Pulmonary Disease

## 2014-03-30 VITALS — BP 152/80 | HR 57 | Ht 63.0 in | Wt 169.4 lb

## 2014-03-30 DIAGNOSIS — I2510 Atherosclerotic heart disease of native coronary artery without angina pectoris: Secondary | ICD-10-CM

## 2014-03-30 DIAGNOSIS — G4700 Insomnia, unspecified: Secondary | ICD-10-CM | POA: Diagnosis not present

## 2014-03-30 MED ORDER — TRAZAMINE 50 MG PO MISC
ORAL | Status: DC
Start: 2014-03-30 — End: 2014-03-31

## 2014-03-30 NOTE — Patient Instructions (Signed)
Your problem seems to be insomnia Rules of sleep hygiene were discussed  - light exercise -avoid caffeinated beverages - no more than 20 mins staying awake in bed, if not asleep, get out of bed & reading or light music - No TV or computer games at bedtime.  Trial of Trazodone 50 mg tabs - 1 hr prior to bedtime If this does not work x 3 nights, then increase to 2 tabs at bedtime  Call me back in 2 weeks to report
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
## 2014-03-30 NOTE — Assessment & Plan Note (Signed)
Your problem seems to be insomnia rather than an issue with sleep quality Rules of sleep hygiene were discussed  - light exercise -avoid caffeinated beverages - no more than 20 mins staying awake in bed, if not asleep, get out of bed & reading or light music - No TV or computer games at bedtime.  Trial of Trazodone 50 mg tabs - 1 hr prior to bedtime If this does not work x 3 nights, then increase to 2 tabs at bedtime  Call me back in 2 weeks to report,  If this works ,further followup can be with PCP. I do not feel that she is sleep disordered breathing, and as such polysomnogram is not indicated

## 2014-03-31 ENCOUNTER — Other Ambulatory Visit: Payer: Self-pay | Admitting: *Deleted

## 2014-03-31 MED ORDER — TRAZODONE HCL 50 MG PO TABS: ORAL_TABLET | ORAL | Status: DC

## 2014-04-08 DIAGNOSIS — M7072 Other bursitis of hip, left hip: Secondary | ICD-10-CM | POA: Diagnosis not present

## 2014-04-08 DIAGNOSIS — M545 Low back pain: Secondary | ICD-10-CM | POA: Diagnosis not present

## 2014-04-09 ENCOUNTER — Telehealth: Payer: Self-pay | Admitting: Pulmonary Disease

## 2014-04-09 DIAGNOSIS — G4700 Insomnia, unspecified: Secondary | ICD-10-CM

## 2014-04-09 NOTE — Telephone Encounter (Signed)
Called and spoke with pt and she stated that she has been using the trazodone and this has not been helping her sleep.  She stated that one night she accidentally took 2 tablets and she stated that this caused her vision to be blurry.  She stated that it has not helped with her sleeping.    She also wanted RA to know that she went and seen ortho yesterday and was told that she has bursitis in her left hip and she stated that she sleeps on this side as well.  She stated that they did do an injection in this hip and she is hoping that that this will help her feel better.   She wanted RA to be aware of this.    Will forward to RA for further recs.  Please advise. Thanks  No Known Allergies  Current Outpatient Prescriptions on File Prior to Visit  Medication Sig Dispense Refill  . albuterol (PROVENTIL HFA;VENTOLIN HFA) 108 (90 BASE) MCG/ACT inhaler Inhale 2 puffs into the lungs every 6 (six) hours as needed for wheezing or shortness of breath (Pneumonia).      Marland Kitchen amLODipine (NORVASC) 10 MG tablet TAKE ONE-HALF (0.5) TABLET BY MOUTH IN THE MORNING      . aspirin 81 MG tablet Take 81 mg by mouth daily.      . Blood Glucose Monitoring Suppl (ONE TOUCH ULTRA SYSTEM KIT) W/DEVICE KIT 1 kit by Does not apply route once. DX: 250.00 Check sugars daily as needed  1 each  0  . Calcium-Vitamin D-Vitamin K 500-100-40 MG-UNT-MCG CHEW Chew 3 capsules by mouth daily.      Marland Kitchen glucose blood (ONE TOUCH TEST STRIPS) test strip Check BS daily and prn  DX: 250.00  100 each  1  . isosorbide mononitrate (IMDUR) 60 MG 24 hr tablet TAKE ONE-HALF TABLET BY MOUTH ONCE DAILY  30 tablet  5  . KLOR-CON M20 20 MEQ tablet TAKE ONE TABLET BY MOUTH ONCE DAILY  30 tablet  0  . losartan-hydrochlorothiazide (HYZAAR) 100-25 MG per tablet TAKE ONE TABLET BY MOUTH ONCE DAILY  90 tablet  3  . magnesium oxide (MAG-OX) 400 MG tablet Take 400 mg by mouth 2 (two) times daily.       . metoprolol succinate (TOPROL-XL) 50 MG 24 hr tablet TAKE ONE  TABLET BY MOUTH TWICE DAILY IMMEDIATELY  FOLLOWING A MEAL  60 tablet  0  . omeprazole (PRILOSEC) 20 MG capsule Take 20 mg by mouth 2 (two) times daily before a meal.      . ONETOUCH DELICA LANCETS FINE MISC Check sugars daily and prn  DX 250.00  100 each  1  . pramipexole (MIRAPEX) 0.25 MG tablet Take 1 tablet (0.25 mg total) by mouth 2 (two) times daily.  180 tablet  1  . simvastatin (ZOCOR) 20 MG tablet TAKE ONE TABLET BY MOUTH AT BEDTIME  30 tablet  0  . traZODone (DESYREL) 50 MG tablet 1 hr prior to bedtime. If this does not work x 3 nights, then increase to 2 tabs at bedtime  20 tablet  0  . [DISCONTINUED] nitroGLYCERIN (NITROSTAT) 0.4 MG SL tablet Place 0.4 mg under the tongue every 5 (five) minutes as needed.         No current facility-administered medications on file prior to visit.

## 2014-04-10 NOTE — Telephone Encounter (Signed)
Trial fo Lunesta 2mg  qhs # 15 tabs Referral to cognitive  behaviour therapy for insomnia

## 2014-04-12 MED ORDER — ESZOPICLONE 2 MG PO TABS: 2.0000 mg | ORAL_TABLET | Freq: Every day | ORAL | Status: DC

## 2014-04-12 NOTE — Telephone Encounter (Signed)
Spoke with pt and advised of Dr Bari Mantis recommendations.  Rx called to pharmacy and referral order placed.

## 2014-04-15 ENCOUNTER — Telehealth: Payer: Self-pay | Admitting: Pulmonary Disease

## 2014-04-15 NOTE — Telephone Encounter (Signed)
Received a PA for the lunesta 2 mg.  Do you want to complete the PA for change the medication for the pt.  Please advise. Thanks.

## 2014-04-19 ENCOUNTER — Telehealth: Payer: Self-pay | Admitting: Family Medicine

## 2014-04-19 DIAGNOSIS — G2581 Restless legs syndrome: Secondary | ICD-10-CM

## 2014-04-19 MED ORDER — PRAMIPEXOLE DIHYDROCHLORIDE 0.25 MG PO TABS: 0.2500 mg | ORAL_TABLET | Freq: Two times a day (BID) | ORAL | Status: DC

## 2014-04-19 NOTE — Telephone Encounter (Signed)
Caller name: Tyanna  Call back number:[phone]  [phone] Pharmacy: [facility]  [phone])  Reason for call: Pt is out of town, and is needing her refill for Rx pramipexole (MIRAPEX) 0.25 MG tablet sent to the pharmacy listed above.  Thank you

## 2014-04-23 ENCOUNTER — Ambulatory Visit: Payer: Medicare Other | Admitting: Family Medicine

## 2014-04-23 NOTE — Telephone Encounter (Signed)
Pt is calling regarding the PA for Lunesta.  Pt wants to know what the hold up is & can be reached at [phone] or [phone].  Satira Anis

## 2014-04-26 ENCOUNTER — Other Ambulatory Visit: Payer: Self-pay | Admitting: Cardiovascular Disease

## 2014-04-26 NOTE — Telephone Encounter (Signed)
PA form completed and placed in RA look at to be signed and will need to be faxed back.  Will hold in my box to follow up on.

## 2014-04-29 ENCOUNTER — Other Ambulatory Visit: Payer: Self-pay

## 2014-04-29 MED ORDER — GLUCOSE BLOOD VI STRP: ORAL_STRIP | Status: DC

## 2014-04-30 ENCOUNTER — Encounter: Payer: Self-pay | Admitting: Cardiovascular Disease

## 2014-04-30 ENCOUNTER — Ambulatory Visit (INDEPENDENT_AMBULATORY_CARE_PROVIDER_SITE_OTHER): Payer: Medicare Other | Admitting: Cardiovascular Disease

## 2014-04-30 ENCOUNTER — Telehealth: Payer: Self-pay | Admitting: Family Medicine

## 2014-04-30 VITALS — BP 148/80 | HR 75 | Ht 63.0 in | Wt 164.0 lb

## 2014-04-30 DIAGNOSIS — I2510 Atherosclerotic heart disease of native coronary artery without angina pectoris: Secondary | ICD-10-CM | POA: Diagnosis not present

## 2014-04-30 DIAGNOSIS — E785 Hyperlipidemia, unspecified: Secondary | ICD-10-CM

## 2014-04-30 DIAGNOSIS — Z23 Encounter for immunization: Secondary | ICD-10-CM

## 2014-04-30 MED ORDER — LOSARTAN POTASSIUM-HCTZ 100-25 MG PO TABS: ORAL_TABLET | ORAL | Status: DC

## 2014-04-30 MED ORDER — GLUCOSE BLOOD VI STRP: ORAL_STRIP | Status: DC

## 2014-04-30 MED ORDER — SIMVASTATIN 20 MG PO TABS: ORAL_TABLET | ORAL | Status: DC

## 2014-04-30 NOTE — Patient Instructions (Signed)
Your physician wants you to follow-up in: 1 YEAR with Dr Cooper.  You will receive a reminder letter in the mail two months in advance. If you don't receive a letter, please call our office to schedule the follow-up appointment.  Your physician recommends that you continue on your current medications as directed. Please refer to the Current Medication list given to you today.  

## 2014-04-30 NOTE — Telephone Encounter (Signed)
Caller name: [place] Relation to pt: self Call back number: [phone] Pharmacy: [facility] on [address]  Reason for call:   Please call patients pharmacy, they are telling patient that they need more info from [place] regarding test strip refills

## 2014-04-30 NOTE — Telephone Encounter (Signed)
Spoke to the pharmacy and they stated they need the ICD10  Number for medicare  New rx sent

## 2014-04-30 NOTE — Progress Notes (Signed)
Background: The patient is followed for coronary artery disease. She underwent multivessel stenting in 2006. She's had no coronary related issues since that time.  HPI:  79 year old woman presenting for followup evaluation. At the time of her last visit in 2014 her antihypertensives were reduced because of generalized fatigue and symptomatic hypotension. She's doing well from a cardiac perspective. She has occasional heart palpitations. She's had one recent episode where her blood pressure was 90/40 and she was weak. Otherwise these episodes are greatly reduced in frequency and severity. She denies chest pain, chest pressure, edema, orthopnea, or PND. She has chronic exertional dyspnea but hasn't noticed any recent symptoms.   Outpatient Encounter Prescriptions as of 04/30/2014  Medication Sig  . amLODipine (NORVASC) 10 MG tablet TAKE ONE-HALF (0.5) TABLET BY MOUTH IN THE MORNING  . aspirin 81 MG tablet Take 81 mg by mouth daily.  . Blood Glucose Monitoring Suppl (ONE TOUCH ULTRA SYSTEM KIT) W/DEVICE KIT 1 kit by Does not apply route once. DX: 250.00 Check sugars daily as needed  . Calcium-Vitamin D-Vitamin K 500-100-40 MG-UNT-MCG CHEW Chew 3 capsules by mouth daily.  Marland Kitchen glucose blood (ONE TOUCH TEST STRIPS) test strip Check BS daily and prn  DX: E11.9  . isosorbide mononitrate (IMDUR) 60 MG 24 hr tablet TAKE ONE-HALF TABLET BY MOUTH ONCE DAILY  . KLOR-CON M20 20 MEQ tablet TAKE ONE TABLET BY MOUTH ONCE DAILY  . losartan-hydrochlorothiazide (HYZAAR) 100-25 MG per tablet TAKE ONE TABLET BY MOUTH ONCE DAILY  . magnesium oxide (MAG-OX) 400 MG tablet Take 400 mg by mouth 2 (two) times daily.   . metoprolol succinate (TOPROL-XL) 50 MG 24 hr tablet TAKE ONE TABLET BY MOUTH TWICE DAILY IMMEDIATELY FOLLOWING A MEAL  . omeprazole (PRILOSEC) 20 MG capsule Take 20 mg by mouth 2 (two) times daily before a meal.  . ONETOUCH DELICA LANCETS FINE MISC Check sugars daily and prn  DX 250.00  . pramipexole  (MIRAPEX) 0.25 MG tablet Take 1 tablet (0.25 mg total) by mouth 2 (two) times daily.  . eszopiclone (LUNESTA) 2 MG TABS tablet Take 1 tablet (2 mg total) by mouth at bedtime. Take immediately before bedtime  . simvastatin (ZOCOR) 20 MG tablet TAKE ONE TABLET BY MOUTH AT BEDTIME  . [DISCONTINUED] albuterol (PROVENTIL HFA;VENTOLIN HFA) 108 (90 BASE) MCG/ACT inhaler Inhale 2 puffs into the lungs every 6 (six) hours as needed for wheezing or shortness of breath (Pneumonia).  . [DISCONTINUED] glucose blood (ONE TOUCH TEST STRIPS) test strip Check BS daily and prn  DX: 250.00    No Known Allergies  Past Medical History  Diagnosis Date  . Coronary artery disease     post percutaneous coronary intervention in 2006  . Hyperlipidemia   . Restless legs     takes mirapex daily  . Fatigue   . Persistent disorder of initiating or maintaining sleep   . Dermatitis   . S/P inguinal herniorrhaphy   . DJD (degenerative joint disease) of knee 09/18/2011    knees- both, elbow- R, ankles   . Hypertension     takes medications daily  . GERD (gastroesophageal reflux disease)     prilosec daily  . H/O dizziness     pre-syncope/due to dehydration  . Occasional tremors   . Diverticulitis     hx of  . Gastric ulcer     hx of  . Angina   . Atrial fibrillation   . Dysrhythmia, cardiac     "palpitations"  . Borderline diabetes   .  H/O hiatal hernia   . Stroke 2000    TIA- series that have resolved-  . Hx of total knee replacement 11/12/11    left knee  . UTI (lower urinary tract infection) 10/04/2013  . Medicare annual wellness visit, subsequent 11/22/2013    family history includes Arthritis in an other family member; Cancer in her brother; Coronary artery disease in her mother; Depression in her brother; Diabetes in her brother and daughter; Heart disease in her mother; Hypertension in her daughter and another family member; Other in an other family member; Stroke in her brother and father. There is  no history of Breast cancer, Colon cancer, Anesthesia problems, Hypotension, Malignant hyperthermia, or Pseudochol deficiency.   ROS: Negative except as per HPI  BP 148/80  Pulse 75  Ht '5\' 3"'  (1.6 m)  Wt 164 lb (74.39 kg)  BMI 29.06 kg/m2  PHYSICAL EXAM: Pt is alert and oriented, NAD HEENT: normal Neck: JVP - normal, carotids 2+= without bruits Lungs: CTA bilaterally CV: RRR without murmur or gallop Abd: soft, NT, Positive BS, no hepatomegaly Ext: no C/C/E, distal pulses intact and equal Skin: warm/dry no rash  EKG:  Normal sinus rhythm 75 beats per minute, occasional fusion complexes  ASSESSMENT AND PLAN: 1. Coronary artery disease, native vessel, without symptoms of angina. The patient will continue on her current medical program. Her antianginal drugs include Imdur, amlodipine, and metoprolol succinate. She is doing well with no chest pain at the present time.  2. Essential hypertension. Issues noted with low blood pressures after medication administration. She seems to be doing well on a combination of amlodipine, isosorbide, losartan/hydrochlorothiazide, and metoprolol succinate at their current doses.  3. Hyperlipidemia. Most recent lipids reviewed. She remains on simvastatin. Simvastatin was refilled today.  [name] MD 04/30/2014 2:11 PM

## 2014-05-05 DIAGNOSIS — S838X2A Sprain of other specified parts of left knee, initial encounter: Secondary | ICD-10-CM | POA: Diagnosis not present

## 2014-05-06 ENCOUNTER — Other Ambulatory Visit (HOSPITAL_COMMUNITY): Payer: Self-pay | Admitting: Cardiology

## 2014-05-06 ENCOUNTER — Ambulatory Visit (HOSPITAL_COMMUNITY): Payer: Medicare Other | Attending: Cardiology | Admitting: Cardiology

## 2014-05-06 DIAGNOSIS — E119 Type 2 diabetes mellitus without complications: Secondary | ICD-10-CM | POA: Insufficient documentation

## 2014-05-06 DIAGNOSIS — M79605 Pain in left leg: Secondary | ICD-10-CM | POA: Diagnosis not present

## 2014-05-06 DIAGNOSIS — I2510 Atherosclerotic heart disease of native coronary artery without angina pectoris: Secondary | ICD-10-CM | POA: Insufficient documentation

## 2014-05-06 DIAGNOSIS — M79606 Pain in leg, unspecified: Secondary | ICD-10-CM

## 2014-05-06 DIAGNOSIS — E785 Hyperlipidemia, unspecified: Secondary | ICD-10-CM | POA: Insufficient documentation

## 2014-05-06 DIAGNOSIS — I10 Essential (primary) hypertension: Secondary | ICD-10-CM | POA: Diagnosis not present

## 2014-05-06 NOTE — Progress Notes (Signed)
Left lower venous duplex performed  

## 2014-05-07 NOTE — Telephone Encounter (Signed)
Mindy do you know if this PA for the [facility] has been faxed back?  Any response?  thanks

## 2014-05-07 NOTE — Telephone Encounter (Signed)
PA was approved until 07/08/14. Called made [facility] aware. Nothing further needed

## 2014-05-13 DIAGNOSIS — M7061 Trochanteric bursitis, right hip: Secondary | ICD-10-CM | POA: Diagnosis not present

## 2014-05-13 DIAGNOSIS — S838X2D Sprain of other specified parts of left knee, subsequent encounter: Secondary | ICD-10-CM | POA: Diagnosis not present

## 2014-05-15 ENCOUNTER — Other Ambulatory Visit: Payer: Self-pay | Admitting: Physician Assistant

## 2014-05-15 ENCOUNTER — Other Ambulatory Visit: Payer: Self-pay | Admitting: Family Medicine

## 2014-05-17 NOTE — Telephone Encounter (Signed)
Rx request to pharmacy/SLS  

## 2014-05-17 NOTE — Telephone Encounter (Signed)
Called and confirmed with patient that she is only taking 1/2 tab of Amlodopine daily

## 2014-05-20 ENCOUNTER — Encounter: Payer: Self-pay | Admitting: Family Medicine

## 2014-05-20 ENCOUNTER — Ambulatory Visit (INDEPENDENT_AMBULATORY_CARE_PROVIDER_SITE_OTHER): Payer: Medicare Other | Admitting: Family Medicine

## 2014-05-20 VITALS — BP 156/75 | HR 53 | Temp 97.6°F | Ht 63.0 in | Wt 165.4 lb

## 2014-05-20 DIAGNOSIS — I10 Essential (primary) hypertension: Secondary | ICD-10-CM | POA: Diagnosis not present

## 2014-05-20 DIAGNOSIS — E785 Hyperlipidemia, unspecified: Secondary | ICD-10-CM

## 2014-05-20 DIAGNOSIS — K219 Gastro-esophageal reflux disease without esophagitis: Secondary | ICD-10-CM | POA: Diagnosis not present

## 2014-05-20 DIAGNOSIS — E118 Type 2 diabetes mellitus with unspecified complications: Secondary | ICD-10-CM | POA: Diagnosis not present

## 2014-05-20 DIAGNOSIS — R1010 Upper abdominal pain, unspecified: Secondary | ICD-10-CM | POA: Diagnosis not present

## 2014-05-20 DIAGNOSIS — M25559 Pain in unspecified hip: Secondary | ICD-10-CM

## 2014-05-20 DIAGNOSIS — I2510 Atherosclerotic heart disease of native coronary artery without angina pectoris: Secondary | ICD-10-CM

## 2014-05-20 NOTE — Progress Notes (Signed)
Pre visit review using our clinic review tool, if applicable. No additional management support is needed unless otherwise documented below in the visit note. 

## 2014-05-21 LAB — RENAL FUNCTION PANEL
Albumin: 3.6 g/dL (ref 3.5–5.2)
BUN: 26 mg/dL — ABNORMAL HIGH (ref 6–23)
CALCIUM: 9.2 mg/dL (ref 8.4–10.5)
CO2: 28 meq/L (ref 19–32)
CREATININE: 1 mg/dL (ref 0.4–1.2)
Chloride: 101 mEq/L (ref 96–112)
GFR: 58.39 mL/min — AB (ref >60.00)
Glucose, Bld: 146 mg/dL — ABNORMAL HIGH (ref 70–99)
POTASSIUM: 3.9 meq/L (ref 3.5–5.1)
Phosphorus: 3.4 mg/dL (ref 2.3–4.6)
SODIUM: 138 meq/L (ref 135–145)

## 2014-05-21 LAB — CBC
HEMATOCRIT: 42.4 % (ref 36.0–46.0)
HEMOGLOBIN: 14 g/dL (ref 12.0–15.0)
MCHC: 33.1 g/dL (ref 30.0–36.0)
MCV: 92 fl (ref 78.0–100.0)
Platelets: 185 10*3/uL (ref 150.0–400.0)
RBC: 4.61 Mil/uL (ref 3.87–5.11)
RDW: 14.2 % (ref 11.5–15.5)
WBC: 7.3 10*3/uL (ref 4.0–10.5)

## 2014-05-21 LAB — TSH: TSH: 1.06 u[IU]/mL (ref 0.35–4.50)

## 2014-05-21 LAB — LIPID PANEL
Cholesterol: 154 mg/dL (ref 0–200)
HDL: 47.7 mg/dL (ref >39.00)
NONHDL: 106.3
Total CHOL/HDL Ratio: 3
Triglycerides: 211 mg/dL — ABNORMAL HIGH (ref 0.0–149.0)
VLDL: 42.2 mg/dL — ABNORMAL HIGH (ref 0.0–40.0)

## 2014-05-21 LAB — MAGNESIUM: Magnesium: 1.4 mg/dL — ABNORMAL LOW (ref 1.5–2.5)

## 2014-05-21 LAB — LDL CHOLESTEROL, DIRECT: LDL DIRECT: 73.4 mg/dL

## 2014-05-21 LAB — HEMOGLOBIN A1C: HEMOGLOBIN A1C: 8.4 % — AB (ref 4.6–6.5)

## 2014-05-24 ENCOUNTER — Encounter: Payer: Self-pay | Admitting: Family Medicine

## 2014-05-24 DIAGNOSIS — E118 Type 2 diabetes mellitus with unspecified complications: Secondary | ICD-10-CM

## 2014-05-24 MED ORDER — METFORMIN HCL 500 MG PO TABS: 500.0000 mg | ORAL_TABLET | Freq: Every day | ORAL | Status: DC

## 2014-05-24 NOTE — Telephone Encounter (Signed)
Please advise? I don't see this on the med list

## 2014-05-26 ENCOUNTER — Other Ambulatory Visit: Payer: Self-pay | Admitting: Cardiovascular Disease

## 2014-05-29 ENCOUNTER — Encounter: Payer: Self-pay | Admitting: Family Medicine

## 2014-05-29 NOTE — Assessment & Plan Note (Signed)
Patient reports generally well controlled, did not take meds today. Encouraged heart healthy diet such as the DASH diet and exercise as tolerated.

## 2014-05-29 NOTE — Progress Notes (Signed)
Heather [name]  [id number] 11/11/1934 05/29/2014      Progress Note-Follow Up  Subjective  Chief Complaint  Chief Complaint  Patient presents with  . Follow-up    on diabetes  . Abdominal Pain    upper abdomen tender places    HPI  Patient is a 79 y.o. female in today for routine medical care. She is doing fairly well. Did have an episode of N/V and diarrhea earlier in the week but only some mild nausea persists. Sugars have gnerraly been in the 120 to 130 range. Highest number was 198. No polyuria or polydipsia. compains of some midly tender spots on her abdomen, she describes a prickly, needling type feeling at times. No pattern. Denies CP/palp/SOB/HA/congestion/fevers/GI or GU c/o. Taking meds as prescribed  Past Medical History  Diagnosis Date  . Coronary artery disease     post percutaneous coronary intervention in 2006  . Hyperlipidemia   . Restless legs     takes mirapex daily  . Fatigue   . Persistent disorder of initiating or maintaining sleep   . Dermatitis   . S/P inguinal herniorrhaphy   . DJD (degenerative joint disease) of knee 09/18/2011    knees- both, elbow- R, ankles   . Hypertension     takes medications daily  . GERD (gastroesophageal reflux disease)     prilosec daily  . H/O dizziness     pre-syncope/due to dehydration  . Occasional tremors   . Diverticulitis     hx of  . Gastric ulcer     hx of  . Angina   . Atrial fibrillation   . Dysrhythmia, cardiac     "palpitations"  . Borderline diabetes   . H/O hiatal hernia   . Stroke 2000    TIA- series that have resolved-  . Hx of total knee replacement 11/12/11    left knee  . UTI (lower urinary tract infection) 10/04/2013  . Medicare annual wellness visit, subsequent 11/22/2013  . Hip pain 08/28/2011    Past Surgical History  Procedure Laterality Date  . Abdominal hysterectomy    . Knee arthroscopy      2 on left and 1 on right  . Foot surgery      left foot stress fracture repair  .  Hernia repair      1989- esophageal hernia   . Upper gastrointestinal endoscopy  2013  . Total knee arthroplasty  11/12/2011    Procedure: TOTAL KNEE ARTHROPLASTY;  Surgeon: Lorn Junes, MD;  Location: [facility];  Service: Orthopedics;  Laterality: Left;  Dr Noemi Chapel wants 90 minutes for this case  . Brain surgery    . Coronary angioplasty  03/2005; 06/2005    "1" plus "1"= "2 total"  . Eye surgery  11-16-13    cataract -right eye    Family History  Problem Relation Age of Onset  . Arthritis      family hx of  . Hypertension      family hx of  . Other      family hx of cardiovascular disorder  . Stroke Father     family hx of M 1st degree relative <50  . Coronary artery disease Mother   . Heart disease Mother   . Depression Brother   . Breast cancer Neg Hx   . Colon cancer Neg Hx   . Anesthesia problems Neg Hx   . Hypotension Neg Hx   . Malignant hyperthermia Neg Hx   .  Pseudochol deficiency Neg Hx   . Stroke Brother   . Diabetes Brother   . Cancer Brother     bladder with mets  . Diabetes Daughter     borderline  . Hypertension Daughter     History   Social History  . Marital Status: Married    Spouse Name: N/A    Number of Children: N/A  . Years of Education: N/A   Occupational History  . works partime in office    Social History Main Topics  . Smoking status: Never Smoker   . Smokeless tobacco: Never Used  . Alcohol Use: No  . Drug Use: No  . Sexual Activity: Yes    Birth Control/ Protection: Surgical     Comment: lives with husband, no dietary restrictions   Other Topics Concern  . Not on file   Social History Narrative    Current Outpatient Prescriptions on File Prior to Visit  Medication Sig Dispense Refill  . amLODipine (NORVASC) 10 MG tablet Take 0.5 tablets (5 mg total) by mouth daily. 45 tablet 0  . aspirin 81 MG tablet Take 81 mg by mouth daily.    . Blood Glucose Monitoring Suppl (ONE TOUCH ULTRA SYSTEM KIT) W/DEVICE KIT 1 kit by Does not  apply route once. DX: 250.00 Check sugars daily as needed 1 each 0  . Calcium-Vitamin D-Vitamin K 500-100-40 MG-UNT-MCG CHEW Chew 3 capsules by mouth daily.    . eszopiclone (LUNESTA) 2 MG TABS tablet Take 1 tablet (2 mg total) by mouth at bedtime. Take immediately before bedtime 15 tablet 0  . glucose blood (ONE TOUCH TEST STRIPS) test strip Check BS daily and prn  DX: E11.9 100 each 1  . losartan-hydrochlorothiazide (HYZAAR) 100-25 MG per tablet TAKE ONE TABLET BY MOUTH ONCE DAILY 90 tablet 3  . magnesium oxide (MAG-OX) 400 MG tablet Take 400 mg by mouth 2 (two) times daily.     Marland Kitchen omeprazole (PRILOSEC) 20 MG capsule Take 20 mg by mouth 2 (two) times daily before a meal.    . ONETOUCH DELICA LANCETS FINE MISC Check sugars daily and prn  DX 250.00 100 each 1  . pramipexole (MIRAPEX) 0.25 MG tablet TAKE ONE TABLET BY MOUTH TWICE DAILY 60 tablet 0  . simvastatin (ZOCOR) 20 MG tablet TAKE ONE TABLET BY MOUTH AT BEDTIME 90 tablet 3  . [DISCONTINUED] nitroGLYCERIN (NITROSTAT) 0.4 MG SL tablet Place 0.4 mg under the tongue every 5 (five) minutes as needed.       No current facility-administered medications on file prior to visit.    No Known Allergies  Review of Systems  Review of Systems  Constitutional: Negative for fever and malaise/fatigue.  HENT: Negative for congestion.   Eyes: Negative for discharge.  Respiratory: Negative for shortness of breath.   Cardiovascular: Negative for chest pain, palpitations and leg swelling.  Gastrointestinal: Positive for nausea and abdominal pain. Negative for diarrhea.  Genitourinary: Negative for dysuria.  Musculoskeletal: Negative for falls.  Skin: Negative for rash.  Neurological: Negative for loss of consciousness and headaches.  Endo/Heme/Allergies: Negative for polydipsia.  Psychiatric/Behavioral: Negative for depression and suicidal ideas. The patient is not nervous/anxious and does not have insomnia.     Objective  BP 156/75 mmHg   Pulse 53  Temp(Src) 97.6 F (36.4 C) (Oral)  Ht '5\' 3"'  (1.6 m)  Wt 165 lb 6.4 oz (75.025 kg)  BMI 29.31 kg/m2  SpO2 97%  Physical Exam  Physical Exam  Constitutional: She is oriented  to person, place, and time and well-developed, well-nourished, and in no distress. No distress.  HENT:  Head: Normocephalic and atraumatic.  Eyes: Conjunctivae are normal.  Neck: Neck supple. No thyromegaly present.  Cardiovascular: Normal rate, regular rhythm and normal heart sounds.   No murmur heard. Pulmonary/Chest: Effort normal and breath sounds normal. She has no wheezes.  Abdominal: She exhibits no distension and no mass.  Musculoskeletal: She exhibits no edema.  Lymphadenopathy:    She has no cervical adenopathy.  Neurological: She is alert and oriented to person, place, and time.  Skin: Skin is warm and dry. No rash noted. She is not diaphoretic.  Psychiatric: Memory, affect and judgment normal.    Lab Results  Component Value Date   TSH 1.06 05/20/2014   Lab Results  Component Value Date   WBC 7.3 05/20/2014   HGB 14.0 05/20/2014   HCT 42.4 05/20/2014   MCV 92.0 05/20/2014   PLT 185.0 05/20/2014   Lab Results  Component Value Date   CREATININE 1.0 05/20/2014   BUN 26* 05/20/2014   NA 138 05/20/2014   K 3.9 05/20/2014   CL 101 05/20/2014   CO2 28 05/20/2014   Lab Results  Component Value Date   ALT 25 11/10/2013   AST 16 11/10/2013   ALKPHOS 92 11/10/2013   BILITOT 0.6 11/10/2013   Lab Results  Component Value Date   CHOL 154 05/20/2014   Lab Results  Component Value Date   HDL 47.70 05/20/2014   Lab Results  Component Value Date   LDLCALC 66 11/10/2013   Lab Results  Component Value Date   TRIG 211.0* 05/20/2014   Lab Results  Component Value Date   CHOLHDL 3 05/20/2014     Assessment & Plan  Essential hypertension Patient reports generally well controlled, did not take meds today. Encouraged heart healthy diet such as the DASH diet and exercise  as tolerated.   GERD Avoid offending foods, start probiotics. Do not eat large meals in late evening and consider raising head of bed.   DM (diabetes mellitus) hgba1c acceptable, minimize simple carbs. Increase exercise as tolerated. Metformin 500 mg po qhs,   Hyperlipidemia Tolerating statin, encouraged heart healthy diet, avoid trans fats, minimize simple carbs and saturated fats. Increase exercise as tolerated  Hip pain B/l L>R, following with Raliegh Ip. S/p course of steroids which was somewhat helpful. Encouraged curcumen and Tylenol

## 2014-05-29 NOTE — Assessment & Plan Note (Signed)
Avoid offending foods, start probiotics. Do not eat large meals in late evening and consider raising head of bed.  

## 2014-05-29 NOTE — Assessment & Plan Note (Signed)
hgba1c acceptable, minimize simple carbs. Increase exercise as tolerated. Metformin 500 mg po qhs,

## 2014-05-29 NOTE — Assessment & Plan Note (Signed)
Tolerating statin, encouraged heart healthy diet, avoid trans fats, minimize simple carbs and saturated fats. Increase exercise as tolerated 

## 2014-05-29 NOTE — Assessment & Plan Note (Signed)
B/l L>R, following with Raliegh Ip. S/p course of steroids which was somewhat helpful. Encouraged curcumen and Tylenol

## 2014-06-09 DIAGNOSIS — M25562 Pain in left knee: Secondary | ICD-10-CM | POA: Diagnosis not present

## 2014-06-10 DIAGNOSIS — M25572 Pain in left ankle and joints of left foot: Secondary | ICD-10-CM | POA: Diagnosis not present

## 2014-06-10 DIAGNOSIS — M6281 Muscle weakness (generalized): Secondary | ICD-10-CM | POA: Diagnosis not present

## 2014-06-10 DIAGNOSIS — M25672 Stiffness of left ankle, not elsewhere classified: Secondary | ICD-10-CM | POA: Diagnosis not present

## 2014-06-17 DIAGNOSIS — M25672 Stiffness of left ankle, not elsewhere classified: Secondary | ICD-10-CM | POA: Diagnosis not present

## 2014-06-17 DIAGNOSIS — M6281 Muscle weakness (generalized): Secondary | ICD-10-CM | POA: Diagnosis not present

## 2014-06-17 DIAGNOSIS — M25572 Pain in left ankle and joints of left foot: Secondary | ICD-10-CM | POA: Diagnosis not present

## 2014-07-03 ENCOUNTER — Emergency Department (HOSPITAL_BASED_OUTPATIENT_CLINIC_OR_DEPARTMENT_OTHER)
Admission: EM | Admit: 2014-07-03 | Discharge: 2014-07-03 | Disposition: A | Discharge status: Home / Self Care | Payer: Medicare Other | Attending: Emergency Medicine | Admitting: Emergency Medicine

## 2014-07-03 ENCOUNTER — Encounter (HOSPITAL_BASED_OUTPATIENT_CLINIC_OR_DEPARTMENT_OTHER): Payer: Self-pay | Admitting: *Deleted

## 2014-07-03 ENCOUNTER — Emergency Department (HOSPITAL_BASED_OUTPATIENT_CLINIC_OR_DEPARTMENT_OTHER): Payer: Medicare Other

## 2014-07-03 DIAGNOSIS — Z87440 Personal history of urinary (tract) infections: Secondary | ICD-10-CM | POA: Diagnosis not present

## 2014-07-03 DIAGNOSIS — J069 Acute upper respiratory infection, unspecified: Secondary | ICD-10-CM | POA: Insufficient documentation

## 2014-07-03 DIAGNOSIS — Z7982 Long term (current) use of aspirin: Secondary | ICD-10-CM | POA: Diagnosis not present

## 2014-07-03 DIAGNOSIS — I25119 Atherosclerotic heart disease of native coronary artery with unspecified angina pectoris: Secondary | ICD-10-CM | POA: Insufficient documentation

## 2014-07-03 DIAGNOSIS — I4891 Unspecified atrial fibrillation: Secondary | ICD-10-CM | POA: Insufficient documentation

## 2014-07-03 DIAGNOSIS — I10 Essential (primary) hypertension: Secondary | ICD-10-CM | POA: Insufficient documentation

## 2014-07-03 DIAGNOSIS — R0602 Shortness of breath: Secondary | ICD-10-CM | POA: Diagnosis not present

## 2014-07-03 DIAGNOSIS — B9789 Other viral agents as the cause of diseases classified elsewhere: Secondary | ICD-10-CM

## 2014-07-03 DIAGNOSIS — E785 Hyperlipidemia, unspecified: Secondary | ICD-10-CM | POA: Insufficient documentation

## 2014-07-03 DIAGNOSIS — G2581 Restless legs syndrome: Secondary | ICD-10-CM | POA: Insufficient documentation

## 2014-07-03 DIAGNOSIS — R05 Cough: Secondary | ICD-10-CM

## 2014-07-03 DIAGNOSIS — M179 Osteoarthritis of knee, unspecified: Secondary | ICD-10-CM | POA: Insufficient documentation

## 2014-07-03 DIAGNOSIS — R509 Fever, unspecified: Secondary | ICD-10-CM | POA: Diagnosis not present

## 2014-07-03 DIAGNOSIS — K219 Gastro-esophageal reflux disease without esophagitis: Secondary | ICD-10-CM | POA: Diagnosis not present

## 2014-07-03 DIAGNOSIS — K449 Diaphragmatic hernia without obstruction or gangrene: Secondary | ICD-10-CM | POA: Diagnosis not present

## 2014-07-03 DIAGNOSIS — Z79899 Other long term (current) drug therapy: Secondary | ICD-10-CM | POA: Insufficient documentation

## 2014-07-03 DIAGNOSIS — Z8673 Personal history of transient ischemic attack (TIA), and cerebral infarction without residual deficits: Secondary | ICD-10-CM | POA: Insufficient documentation

## 2014-07-03 DIAGNOSIS — R059 Cough, unspecified: Secondary | ICD-10-CM

## 2014-07-03 MED ORDER — ALBUTEROL SULFATE HFA 108 (90 BASE) MCG/ACT IN AERS
2.0000 | INHALATION_SPRAY | Freq: Once | RESPIRATORY_TRACT | Status: AC
  Administered 2014-07-03: 2 via RESPIRATORY_TRACT
  Filled 2014-07-03: qty 6.7

## 2014-07-03 NOTE — Discharge Instructions (Signed)
Cough, Adult   A cough is a reflex. It helps you clear your throat and airways. A cough can help heal your body. A cough can last 2 or 3 weeks (acute) or may last more than 8 weeks (chronic). Some common causes of a cough can include an infection, allergy, or a cold.  HOME CARE  · Only take medicine as told by your doctor.  · If given, take your medicines (antibiotics) as told. Finish them even if you start to feel better.  · Use a cold steam vaporizer or humidifier in your home. This can help loosen thick spit (secretions).  · Sleep so you are almost sitting up (semi-upright). Use pillows to do this. This helps reduce coughing.  · Rest as needed.  · Stop smoking if you smoke.  GET HELP RIGHT AWAY IF:  · You have yellowish-white fluid (pus) in your thick spit.  · Your cough gets worse.  · Your medicine does not reduce coughing, and you are losing sleep.  · You cough up blood.  · You have trouble breathing.  · Your pain gets worse and medicine does not help.  · You have a fever.  MAKE SURE YOU:   · Understand these instructions.  · Will watch your condition.  · Will get help right away if you are not doing well or get worse.  Document Released: 03/08/2011 Document Revised: 11/09/2013 Document Reviewed: 03/08/2011  Exit[employer]® Patient Information ©2015 [employer]. This information is not intended to replace advice given to you by your health care provider. Make sure you discuss any questions you have with your health care provider.

## 2014-07-03 NOTE — ED Provider Notes (Signed)
CSN: [id number]     Arrival date & time 07/03/14  1122 History   First MD Initiated Contact with Patient 07/03/14 1202     Chief Complaint  Patient presents with  . URI    Patient is a 79 y.o. female presenting with URI. The history is provided by the patient.  URI Presenting symptoms: congestion, cough, fatigue and fever   Severity:  Moderate Onset quality:  Gradual Duration:  1 week Timing:  Intermittent Progression:  Worsening Chronicity:  New Worsened by:  Nothing tried Ineffective treatments:  None tried pt reports cough/congestion and feeling feverish intermittently over past week No vomiting No diarrhea No hemoptysis She reports CP with cough only No significant SOB is reported    Past Medical History  Diagnosis Date  . Coronary artery disease     post percutaneous coronary intervention in 2006  . Hyperlipidemia   . Restless legs     takes mirapex daily  . Fatigue   . Persistent disorder of initiating or maintaining sleep   . Dermatitis   . S/P inguinal herniorrhaphy   . DJD (degenerative joint disease) of knee 09/18/2011    knees- both, elbow- R, ankles   . Hypertension     takes medications daily  . GERD (gastroesophageal reflux disease)     prilosec daily  . H/O dizziness     pre-syncope/due to dehydration  . Occasional tremors   . Diverticulitis     hx of  . Gastric ulcer     hx of  . Angina   . Atrial fibrillation   . Dysrhythmia, cardiac     "palpitations"  . Borderline diabetes   . H/O hiatal hernia   . Stroke 2000    TIA- series that have resolved-  . Hx of total knee replacement 11/12/11    left knee  . UTI (lower urinary tract infection) 10/04/2013  . Medicare annual wellness visit, subsequent 11/22/2013  . Hip pain 08/28/2011   Past Surgical History  Procedure Laterality Date  . Abdominal hysterectomy    . Knee arthroscopy      2 on left and 1 on right  . Foot surgery      left foot stress fracture repair  . Hernia repair     1989- esophageal hernia   . Upper gastrointestinal endoscopy  2013  . Total knee arthroplasty  11/12/2011    Procedure: TOTAL KNEE ARTHROPLASTY;  Surgeon: Lorn Junes, MD;  Location: [facility];  Service: Orthopedics;  Laterality: Left;  Dr Noemi Chapel wants 90 minutes for this case  . Brain surgery    . Coronary angioplasty  03/2005; 06/2005    "1" plus "1"= "2 total"  . Eye surgery  11-16-13    cataract -right eye   Family History  Problem Relation Age of Onset  . Arthritis      family hx of  . Hypertension      family hx of  . Other      family hx of cardiovascular disorder  . Stroke Father     family hx of M 1st degree relative <50  . Coronary artery disease Mother   . Heart disease Mother   . Depression Brother   . Breast cancer Neg Hx   . Colon cancer Neg Hx   . Anesthesia problems Neg Hx   . Hypotension Neg Hx   . Malignant hyperthermia Neg Hx   . Pseudochol deficiency Neg Hx   . Stroke Brother   .  Diabetes Brother   . Cancer Brother     bladder with mets  . Diabetes Daughter     borderline  . Hypertension Daughter    History  Substance Use Topics  . Smoking status: Never Smoker   . Smokeless tobacco: Never Used  . Alcohol Use: No   OB History    No data available     Review of Systems  Constitutional: Positive for fever and fatigue.  HENT: Positive for congestion.   Respiratory: Positive for cough.   Cardiovascular:       CP with cough   Gastrointestinal: Negative for vomiting.  All other systems reviewed and are negative.     Allergies  Review of patient's allergies indicates no known allergies.  Home Medications   Prior to Admission medications   Medication Sig Start Date End Date Taking? Authorizing Provider  amLODipine (NORVASC) 10 MG tablet Take 0.5 tablets (5 mg total) by mouth daily. 05/17/14   Mosie Lukes, MD  aspirin 81 MG tablet Take 81 mg by mouth daily.    Historical Provider, MD  Blood Glucose Monitoring Suppl (ONE TOUCH ULTRA SYSTEM  KIT) W/DEVICE KIT 1 kit by Does not apply route once. DX: 250.00 Check sugars daily as needed 01/01/14   Mosie Lukes, MD  Calcium-Vitamin D-Vitamin K 500-100-40 MG-UNT-MCG CHEW Chew 3 capsules by mouth daily.    Historical Provider, MD  glucose blood (ONE TOUCH TEST STRIPS) test strip Check BS daily and prn  DX: E11.9 04/30/14   Mosie Lukes, MD  isosorbide mononitrate (IMDUR) 60 MG 24 hr tablet TAKE ONE-HALF TABLET BY MOUTH ONCE DAILY 05/28/14   Blane Ohara, MD  KLOR-CON M20 20 MEQ tablet TAKE ONE TABLET BY MOUTH ONCE DAILY 05/28/14   Blane Ohara, MD  losartan-hydrochlorothiazide [facility]) 100-25 MG per tablet TAKE ONE TABLET BY MOUTH ONCE DAILY 04/30/14   Blane Ohara, MD  magnesium oxide (MAG-OX) 400 MG tablet Take 400 mg by mouth 2 (two) times daily.     Historical Provider, MD  metFORMIN (GLUCOPHAGE) 500 MG tablet Take 1 tablet (500 mg total) by mouth daily with breakfast. 05/24/14   Mosie Lukes, MD  metoprolol succinate (TOPROL-XL) 50 MG 24 hr tablet TAKE ONE TABLET BY MOUTH TWICE DAILY IMMEDIATELY  FOLOWING  A  MEAL 05/28/14   Blane Ohara, MD  omeprazole (PRILOSEC) 20 MG capsule Take 20 mg by mouth 2 (two) times daily before a meal.    Historical Provider, MD  [facility] DELICA LANCETS FINE [place] Check sugars daily and prn  DX 250.00 01/05/14   Mosie Lukes, MD  pramipexole (MIRAPEX) 0.25 MG tablet TAKE ONE TABLET BY MOUTH TWICE DAILY 05/17/14   Mosie Lukes, MD  simvastatin (ZOCOR) 20 MG tablet TAKE ONE TABLET BY MOUTH AT BEDTIME 04/30/14   Blane Ohara, MD   BP 142/55 mmHg  Pulse 67  Temp(Src) 98.3 F (36.8 C) (Oral)  Resp 16  Ht 5' 3.5" (1.613 m)  Wt 162 lb (73.483 kg)  BMI 28.24 kg/m2  SpO2 95% Physical Exam CONSTITUTIONAL: Well developed/well nourished HEAD: Normocephalic/atraumatic EYES: EOMI/PERRL ENMT: Mucous membranes moist, nasal congestion, uvula midline, no erythema/exudates noted NECK: supple no meningeal signs SPINE/BACK:entire spine  nontender CV: S1/S2 noted, no murmurs/rubs/gallops noted LUNGS: Lungs are clear to auscultation bilaterally, no apparent distress ABDOMEN: soft, nontender, no rebound or guarding, bowel sounds noted throughout abdomen GU:no cva tenderness NEURO: Pt is awake/alert/appropriate, moves all extremitiesx4.  No facial droop.  EXTREMITIES: pulses normal/equal, full ROM SKIN: warm, color normal PSYCH: no abnormalities of mood noted, alert and oriented to situation  ED Course  Procedures   12:45 PM Pt well appearing CXR negative She is well appearing, no distress She felt albuterol would help her symptoms Discussed strict return precautions   Imaging Review Dg Chest 2 View  07/03/2014   CLINICAL DATA:  Cough, shortness of breath and fever.  EXAM: CHEST  2 VIEW  COMPARISON:  02/22/2014  FINDINGS: Lungs are clear bilaterally. Heart and mediastinum are within normal limits. Again noted is a hiatal hernia with an air-fluid level. No acute bone abnormality.  IMPRESSION: No active cardiopulmonary disease.  Hiatal hernia.   Electronically Signed   By: Markus Daft M.D.   On: 07/03/2014 12:33    Medications  albuterol (PROVENTIL HFA;VENTOLIN HFA) 108 (90 BASE) MCG/ACT inhaler 2 puff (2 puffs Inhalation Given 07/03/14 1216)     MDM   Final diagnoses:  Viral URI with cough    Nursing notes including past medical history and social history reviewed and considered in documentation xrays/imaging reviewed by myself and considered during evaluation     Sharyon Cable, MD 07/03/14 1245

## 2014-07-03 NOTE — ED Notes (Signed)
Pt c/o URI symtpoms with pro cough x 1 week

## 2014-07-09 DIAGNOSIS — Z86718 Personal history of other venous thrombosis and embolism: Secondary | ICD-10-CM

## 2014-07-09 HISTORY — DX: Personal history of other venous thrombosis and embolism: Z86.718

## 2014-07-14 DIAGNOSIS — R8299 Other abnormal findings in urine: Secondary | ICD-10-CM | POA: Diagnosis not present

## 2014-07-14 DIAGNOSIS — N390 Urinary tract infection, site not specified: Secondary | ICD-10-CM | POA: Diagnosis not present

## 2014-07-20 DIAGNOSIS — R21 Rash and other nonspecific skin eruption: Secondary | ICD-10-CM | POA: Diagnosis not present

## 2014-07-28 ENCOUNTER — Other Ambulatory Visit: Payer: Self-pay

## 2014-07-28 DIAGNOSIS — I2510 Atherosclerotic heart disease of native coronary artery without angina pectoris: Secondary | ICD-10-CM

## 2014-07-28 DIAGNOSIS — E785 Hyperlipidemia, unspecified: Secondary | ICD-10-CM

## 2014-07-28 MED ORDER — SIMVASTATIN 20 MG PO TABS: ORAL_TABLET | ORAL | Status: DC

## 2014-07-28 MED ORDER — ISOSORBIDE MONONITRATE ER 60 MG PO TB24: 30.0000 mg | ORAL_TABLET | Freq: Every day | ORAL | Status: DC

## 2014-07-28 MED ORDER — POTASSIUM CHLORIDE CRYS ER 20 MEQ PO TBCR: 20.0000 meq | EXTENDED_RELEASE_TABLET | Freq: Every day | ORAL | Status: DC

## 2014-07-28 MED ORDER — LOSARTAN POTASSIUM-HCTZ 100-25 MG PO TABS: ORAL_TABLET | ORAL | Status: DC

## 2014-07-28 MED ORDER — METOPROLOL SUCCINATE ER 50 MG PO TB24: ORAL_TABLET | ORAL | Status: DC

## 2014-07-29 ENCOUNTER — Other Ambulatory Visit: Payer: Self-pay | Admitting: *Deleted

## 2014-07-29 DIAGNOSIS — E118 Type 2 diabetes mellitus with unspecified complications: Secondary | ICD-10-CM

## 2014-07-29 MED ORDER — PRAMIPEXOLE DIHYDROCHLORIDE 0.25 MG PO TABS: 0.2500 mg | ORAL_TABLET | Freq: Two times a day (BID) | ORAL | Status: DC

## 2014-07-29 MED ORDER — AMLODIPINE BESYLATE 10 MG PO TABS: 5.0000 mg | ORAL_TABLET | Freq: Every day | ORAL | Status: DC

## 2014-07-29 MED ORDER — METFORMIN HCL 500 MG PO TABS: 500.0000 mg | ORAL_TABLET | Freq: Every day | ORAL | Status: DC

## 2014-07-29 NOTE — Telephone Encounter (Signed)
Rx's sent to the pharmacy by e-script.//AB/CMA 

## 2014-08-16 ENCOUNTER — Telehealth: Payer: Self-pay | Admitting: Family Medicine

## 2014-08-16 DIAGNOSIS — E119 Type 2 diabetes mellitus without complications: Secondary | ICD-10-CM

## 2014-08-16 DIAGNOSIS — I10 Essential (primary) hypertension: Secondary | ICD-10-CM

## 2014-08-16 DIAGNOSIS — E785 Hyperlipidemia, unspecified: Secondary | ICD-10-CM

## 2014-08-16 NOTE — Telephone Encounter (Signed)
Caller name: [facility] Relation to pt: Call back Helen [phone] Pharmacy:  Reason for call: Pt came in stating has an appt 08-19-14 Thursday  for A1C diabetic long term reading and wanted to know if she can have labs done before the appt scheduled. Please advice.

## 2014-08-17 NOTE — Telephone Encounter (Signed)
Yes please order her labs for DM 2 controlled, mixed hyperlipidemia and essential HTN, lipid, cmp, cbc, tsh and hgba1c

## 2014-08-17 NOTE — Telephone Encounter (Signed)
Patient will wait to do labs at appt.

## 2014-08-17 NOTE — Telephone Encounter (Signed)
Labs ordered.  Called the patient left msg to schedule appt.

## 2014-08-19 ENCOUNTER — Ambulatory Visit (INDEPENDENT_AMBULATORY_CARE_PROVIDER_SITE_OTHER): Payer: Medicare Other | Admitting: Family Medicine

## 2014-08-19 ENCOUNTER — Encounter: Payer: Self-pay | Admitting: Family Medicine

## 2014-08-19 VITALS — BP 132/80 | HR 67 | Temp 97.6°F | Ht 63.0 in | Wt 167.0 lb

## 2014-08-19 DIAGNOSIS — D649 Anemia, unspecified: Secondary | ICD-10-CM | POA: Diagnosis not present

## 2014-08-19 DIAGNOSIS — E1169 Type 2 diabetes mellitus with other specified complication: Secondary | ICD-10-CM

## 2014-08-19 DIAGNOSIS — E669 Obesity, unspecified: Secondary | ICD-10-CM | POA: Diagnosis not present

## 2014-08-19 DIAGNOSIS — E782 Mixed hyperlipidemia: Secondary | ICD-10-CM

## 2014-08-19 DIAGNOSIS — I10 Essential (primary) hypertension: Secondary | ICD-10-CM

## 2014-08-19 DIAGNOSIS — E785 Hyperlipidemia, unspecified: Secondary | ICD-10-CM

## 2014-08-19 DIAGNOSIS — N390 Urinary tract infection, site not specified: Secondary | ICD-10-CM | POA: Diagnosis not present

## 2014-08-19 DIAGNOSIS — E119 Type 2 diabetes mellitus without complications: Secondary | ICD-10-CM

## 2014-08-19 DIAGNOSIS — E118 Type 2 diabetes mellitus with unspecified complications: Secondary | ICD-10-CM | POA: Diagnosis not present

## 2014-08-19 DIAGNOSIS — I2510 Atherosclerotic heart disease of native coronary artery without angina pectoris: Secondary | ICD-10-CM | POA: Diagnosis not present

## 2014-08-19 DIAGNOSIS — G2581 Restless legs syndrome: Secondary | ICD-10-CM | POA: Diagnosis not present

## 2014-08-19 LAB — CBC
HCT: 40.2 % (ref 36.0–46.0)
Hemoglobin: 13.6 g/dL (ref 12.0–15.0)
MCHC: 34 g/dL (ref 30.0–36.0)
MCV: 91.2 fl (ref 78.0–100.0)
Platelets: 159 10*3/uL (ref 150.0–400.0)
RBC: 4.41 Mil/uL (ref 3.87–5.11)
RDW: 12.5 % (ref 11.5–15.5)
WBC: 4.7 10*3/uL (ref 4.0–10.5)

## 2014-08-19 LAB — COMPREHENSIVE METABOLIC PANEL
ALBUMIN: 4.1 g/dL (ref 3.5–5.2)
ALT: 18 U/L (ref 0–35)
AST: 20 U/L (ref 0–37)
Alkaline Phosphatase: 68 U/L (ref 39–117)
BUN: 23 mg/dL (ref 6–23)
CHLORIDE: 104 meq/L (ref 96–112)
CO2: 28 meq/L (ref 19–32)
Calcium: 9.4 mg/dL (ref 8.4–10.5)
Creatinine, Ser: 0.95 mg/dL (ref 0.40–1.20)
GFR: 60.49 mL/min (ref >60.00)
GLUCOSE: 134 mg/dL — AB (ref 70–99)
POTASSIUM: 4 meq/L (ref 3.5–5.1)
Sodium: 138 mEq/L (ref 135–145)
Total Bilirubin: 0.5 mg/dL (ref 0.2–1.2)
Total Protein: 6.3 g/dL (ref 6.0–8.3)

## 2014-08-19 LAB — LIPID PANEL
Cholesterol: 149 mg/dL (ref 0–200)
HDL: 55.6 mg/dL (ref >39.00)
LDL CALC: 65 mg/dL (ref 0–99)
NonHDL: 93.4
Total CHOL/HDL Ratio: 3
Triglycerides: 140 mg/dL (ref 0.0–149.0)
VLDL: 28 mg/dL (ref 0.0–40.0)

## 2014-08-19 LAB — URINALYSIS
Bilirubin Urine: NEGATIVE
Hgb urine dipstick: NEGATIVE
KETONES UR: NEGATIVE
LEUKOCYTES UA: NEGATIVE
Nitrite: NEGATIVE
PH: 6 (ref 5.0–8.0)
Specific Gravity, Urine: 1.01 (ref 1.000–1.030)
Total Protein, Urine: NEGATIVE
Urine Glucose: NEGATIVE
Urobilinogen, UA: 0.2 (ref 0.0–1.0)

## 2014-08-19 LAB — HEMOGLOBIN A1C: HEMOGLOBIN A1C: 7.2 % — AB (ref 4.6–6.5)

## 2014-08-19 LAB — TSH: TSH: 1.58 u[IU]/mL (ref 0.35–4.50)

## 2014-08-19 MED ORDER — GLUCOSE BLOOD VI STRP: ORAL_STRIP | Status: DC

## 2014-08-19 MED ORDER — PRAMIPEXOLE DIHYDROCHLORIDE 0.5 MG PO TABS: 0.5000 mg | ORAL_TABLET | Freq: Two times a day (BID) | ORAL | Status: DC

## 2014-08-19 NOTE — Patient Instructions (Signed)

## 2014-08-19 NOTE — Progress Notes (Signed)
Heather [name]  [id number] [birth year] 08/19/2014      Progress Note-Follow Up  Subjective  Chief Complaint  Chief Complaint  Patient presents with  . Follow-up    3 month    HPI  Patient is a 80 y.o. female in today for routine medical care. Doing well. No recent illness but is noting some mild lower abdominal discomfort intermittently. No hematuria, fevers, chills. Is noting some  RLS symptoms at night. Denies CP/palp/SOB/HA/congestion/fevers/GI or GU c/o. Taking meds as prescribed  Past Medical History  Diagnosis Date  . Coronary artery disease     post percutaneous coronary intervention in 2006  . Hyperlipidemia   . Restless legs     takes mirapex daily  . Fatigue   . Persistent disorder of initiating or maintaining sleep   . Dermatitis   . S/P inguinal herniorrhaphy   . DJD (degenerative joint disease) of knee 09/18/2011    knees- both, elbow- R, ankles   . Hypertension     takes medications daily  . GERD (gastroesophageal reflux disease)     prilosec daily  . H/O dizziness     pre-syncope/due to dehydration  . Occasional tremors   . Diverticulitis     hx of  . Gastric ulcer     hx of  . Angina   . Atrial fibrillation   . Dysrhythmia, cardiac     "palpitations"  . Borderline diabetes   . H/O hiatal hernia   . Stroke 2000    TIA- series that have resolved-  . Hx of total knee replacement 11/12/11    left knee  . UTI (lower urinary tract infection) 10/04/2013  . Medicare annual wellness visit, subsequent 11/22/2013  . Hip pain 08/28/2011    Past Surgical History  Procedure Laterality Date  . Abdominal hysterectomy    . Knee arthroscopy      2 on left and 1 on right  . Foot surgery      left foot stress fracture repair  . Hernia repair      1989- esophageal hernia   . Upper gastrointestinal endoscopy  2013  . Total knee arthroplasty  11/12/2011    Procedure: TOTAL KNEE ARTHROPLASTY;  Surgeon: Lorn Junes, MD;  Location: [facility];  Service: Orthopedics;   Laterality: Left;  Dr Noemi Chapel wants 90 minutes for this case  . Brain surgery    . Coronary angioplasty  03/2005; 06/2005    "1" plus "1"= "2 total"  . Eye surgery  11-16-13    cataract -right eye    Family History  Problem Relation Age of Onset  . Arthritis      family hx of  . Hypertension      family hx of  . Other      family hx of cardiovascular disorder  . Stroke Father     family hx of M 1st degree relative <50  . Coronary artery disease Mother   . Heart disease Mother   . Depression Brother   . Breast cancer Neg Hx   . Colon cancer Neg Hx   . Anesthesia problems Neg Hx   . Hypotension Neg Hx   . Malignant hyperthermia Neg Hx   . Pseudochol deficiency Neg Hx   . Stroke Brother   . Diabetes Brother   . Cancer Brother     bladder with mets  . Diabetes Daughter     borderline  . Hypertension Daughter     History  Social History  . Marital Status: Married    Spouse Name: N/A  . Number of Children: N/A  . Years of Education: N/A   Occupational History  . works partime in office    Social History Main Topics  . Smoking status: Never Smoker   . Smokeless tobacco: Never Used  . Alcohol Use: No  . Drug Use: No  . Sexual Activity: Yes    Birth Control/ Protection: Surgical     Comment: lives with husband, no dietary restrictions   Other Topics Concern  . Not on file   Social History Narrative    Current Outpatient Prescriptions on File Prior to Visit  Medication Sig Dispense Refill  . amLODipine (NORVASC) 10 MG tablet Take 0.5 tablets (5 mg total) by mouth daily. 45 tablet 1  . aspirin 81 MG tablet Take 81 mg by mouth daily.    . Blood Glucose Monitoring Suppl (ONE TOUCH ULTRA SYSTEM KIT) W/DEVICE KIT 1 kit by Does not apply route once. DX: 250.00 Check sugars daily as needed 1 each 0  . Calcium-Vitamin D-Vitamin K 500-100-40 MG-UNT-MCG CHEW Chew 3 capsules by mouth daily.    . isosorbide mononitrate (IMDUR) 60 MG 24 hr tablet Take 0.5 tablets (30 mg  total) by mouth daily. 45 tablet 2  . losartan-hydrochlorothiazide (HYZAAR) 100-25 MG per tablet TAKE ONE TABLET BY MOUTH ONCE DAILY 90 tablet 3  . magnesium oxide (MAG-OX) 400 MG tablet Take 400 mg by mouth 2 (two) times daily.     . metFORMIN (GLUCOPHAGE) 500 MG tablet Take 1 tablet (500 mg total) by mouth daily with breakfast. 90 tablet 1  . metoprolol succinate (TOPROL-XL) 50 MG 24 hr tablet TAKE ONE TABLET BY MOUTH TWICE DAILY IMMEDIATELY  FOLOWING  A  MEAL 180 tablet 2  . omeprazole (PRILOSEC) 20 MG capsule Take 20 mg by mouth 2 (two) times daily before a meal.    . ONETOUCH DELICA LANCETS FINE MISC Check sugars daily and prn  DX 250.00 100 each 1  . potassium chloride SA (KLOR-CON M20) 20 MEQ tablet Take 1 tablet (20 mEq total) by mouth daily. 90 tablet 2  . pramipexole (MIRAPEX) 0.25 MG tablet Take 1 tablet (0.25 mg total) by mouth 2 (two) times daily. 180 tablet 1  . simvastatin (ZOCOR) 20 MG tablet TAKE ONE TABLET BY MOUTH AT BEDTIME 90 tablet 3  . [DISCONTINUED] nitroGLYCERIN (NITROSTAT) 0.4 MG SL tablet Place 0.4 mg under the tongue every 5 (five) minutes as needed.       No current facility-administered medications on file prior to visit.    No Known Allergies  Review of Systems  Review of Systems  Constitutional: Negative for fever and malaise/fatigue.  HENT: Negative for congestion.   Eyes: Negative for discharge.  Respiratory: Negative for shortness of breath.   Cardiovascular: Negative for chest pain, palpitations and leg swelling.  Gastrointestinal: Negative for nausea, abdominal pain and diarrhea.  Genitourinary: Positive for urgency and frequency. Negative for dysuria and hematuria.  Musculoskeletal: Negative for falls.  Skin: Negative for rash.  Neurological: Negative for loss of consciousness and headaches.  Endo/Heme/Allergies: Negative for polydipsia.  Psychiatric/Behavioral: Negative for depression and suicidal ideas. The patient is not nervous/anxious and  does not have insomnia.     Objective  BP 132/80 mmHg  Pulse 67  Temp(Src) 97.6 F (36.4 C) (Oral)  Ht '5\' 3"'  (1.6 m)  Wt 167 lb (75.751 kg)  BMI 29.59 kg/m2  SpO2 95%  Physical Exam  Physical Exam  Constitutional: She is oriented to person, place, and time and well-developed, well-nourished, and in no distress. No distress.  HENT:  Head: Normocephalic and atraumatic.  Eyes: Conjunctivae are normal.  Neck: Neck supple. No thyromegaly present.  Cardiovascular: Normal rate, regular rhythm and normal heart sounds.   No murmur heard. Pulmonary/Chest: Effort normal and breath sounds normal. She has no wheezes.  Abdominal: She exhibits no distension and no mass.  Musculoskeletal: She exhibits no edema.  Lymphadenopathy:    She has no cervical adenopathy.  Neurological: She is alert and oriented to person, place, and time.  Skin: Skin is warm and dry. No rash noted. She is not diaphoretic.  Psychiatric: Memory, affect and judgment normal.    Lab Results  Component Value Date   TSH 1.06 05/20/2014   Lab Results  Component Value Date   WBC 7.3 05/20/2014   HGB 14.0 05/20/2014   HCT 42.4 05/20/2014   MCV 92.0 05/20/2014   PLT 185.0 05/20/2014   Lab Results  Component Value Date   CREATININE 1.0 05/20/2014   BUN 26* 05/20/2014   NA 138 05/20/2014   K 3.9 05/20/2014   CL 101 05/20/2014   CO2 28 05/20/2014   Lab Results  Component Value Date   ALT 25 11/10/2013   AST 16 11/10/2013   ALKPHOS 92 11/10/2013   BILITOT 0.6 11/10/2013   Lab Results  Component Value Date   CHOL 154 05/20/2014   Lab Results  Component Value Date   HDL 47.70 05/20/2014   Lab Results  Component Value Date   LDLCALC 66 11/10/2013   Lab Results  Component Value Date   TRIG 211.0* 05/20/2014   Lab Results  Component Value Date   CHOLHDL 3 05/20/2014     Assessment & Plan  Abdominal  pain, other specified site Urinalysis c & s negative, report if symptoms worsen, start a  probiotic   RLS (restless legs syndrome) Increase Pramipexole and reassess   Anemia resolved   Hyperlipidemia Tolerating statin, encouraged heart healthy diet, avoid trans fats, minimize simple carbs and saturated fats. Increase exercise as tolerated   DM (diabetes mellitus) hgba1c acceptable, minimize simple carbs. Increase exercise as tolerated. Continue current meds, good improvement in A1C

## 2014-08-19 NOTE — Progress Notes (Signed)
Pre visit review using our clinic review tool, if applicable. No additional management support is needed unless otherwise documented below in the visit note. 

## 2014-08-20 LAB — URINE CULTURE
COLONY COUNT: NO GROWTH
ORGANISM ID, BACTERIA: NO GROWTH

## 2014-09-01 ENCOUNTER — Encounter: Payer: Self-pay | Admitting: Family Medicine

## 2014-09-01 NOTE — Assessment & Plan Note (Signed)
Tolerating statin, encouraged heart healthy diet, avoid trans fats, minimize simple carbs and saturated fats. Increase exercise as tolerated 

## 2014-09-01 NOTE — Assessment & Plan Note (Addendum)
Urinalysis c & s negative, report if symptoms worsen, start a probiotic

## 2014-09-01 NOTE — Assessment & Plan Note (Signed)
hgba1c acceptable, minimize simple carbs. Increase exercise as tolerated. Continue current meds, good improvement in A1C

## 2014-09-01 NOTE — Assessment & Plan Note (Addendum)
Increase Pramipexole and reassess

## 2014-09-01 NOTE — Assessment & Plan Note (Signed)
resolved 

## 2014-10-14 DIAGNOSIS — Z96652 Presence of left artificial knee joint: Secondary | ICD-10-CM | POA: Diagnosis not present

## 2014-10-14 DIAGNOSIS — M1711 Unilateral primary osteoarthritis, right knee: Secondary | ICD-10-CM | POA: Diagnosis not present

## 2014-10-19 ENCOUNTER — Telehealth: Payer: Self-pay | Admitting: *Deleted

## 2014-10-19 DIAGNOSIS — M1711 Unilateral primary osteoarthritis, right knee: Secondary | ICD-10-CM | POA: Diagnosis not present

## 2014-10-19 DIAGNOSIS — M25561 Pain in right knee: Secondary | ICD-10-CM | POA: Diagnosis not present

## 2014-10-19 NOTE — Telephone Encounter (Signed)
Received surgical clearance form via fax from [facility] for right TKR. Date is pending. Pt has an appt with Dr. [name] scheduled for 11/22/14. Form given to Robin. JG//CMA

## 2014-10-21 NOTE — Telephone Encounter (Signed)
Patient states that she is not using Raliegh Ip for surgery. She is going to Dr. Ronnie Derby

## 2014-10-22 ENCOUNTER — Ambulatory Visit: Payer: Medicare Other | Admitting: Family Medicine

## 2014-10-22 DIAGNOSIS — N644 Mastodynia: Secondary | ICD-10-CM | POA: Diagnosis not present

## 2014-10-26 ENCOUNTER — Other Ambulatory Visit: Payer: Self-pay | Admitting: Gynecology

## 2014-10-26 ENCOUNTER — Encounter: Payer: Self-pay | Admitting: Family Medicine

## 2014-10-26 DIAGNOSIS — N644 Mastodynia: Secondary | ICD-10-CM

## 2014-10-29 ENCOUNTER — Ambulatory Visit (HOSPITAL_BASED_OUTPATIENT_CLINIC_OR_DEPARTMENT_OTHER)
Admission: RE | Admit: 2014-10-29 | Discharge: 2014-10-29 | Disposition: A | Discharge status: Home / Self Care | Payer: Medicare Other | Source: Ambulatory Visit | Attending: Family Medicine | Admitting: Family Medicine

## 2014-10-29 ENCOUNTER — Encounter: Payer: Self-pay | Admitting: Family Medicine

## 2014-10-29 ENCOUNTER — Other Ambulatory Visit: Payer: Self-pay | Admitting: Family Medicine

## 2014-10-29 ENCOUNTER — Telehealth: Payer: Self-pay | Admitting: Cardiovascular Disease

## 2014-10-29 ENCOUNTER — Ambulatory Visit (INDEPENDENT_AMBULATORY_CARE_PROVIDER_SITE_OTHER): Payer: Medicare Other | Admitting: Family Medicine

## 2014-10-29 VITALS — BP 138/78 | HR 69 | Temp 98.6°F | Ht 63.0 in | Wt 162.5 lb

## 2014-10-29 DIAGNOSIS — M47812 Spondylosis without myelopathy or radiculopathy, cervical region: Secondary | ICD-10-CM | POA: Diagnosis not present

## 2014-10-29 DIAGNOSIS — D649 Anemia, unspecified: Secondary | ICD-10-CM

## 2014-10-29 DIAGNOSIS — M542 Cervicalgia: Secondary | ICD-10-CM | POA: Insufficient documentation

## 2014-10-29 DIAGNOSIS — D72829 Elevated white blood cell count, unspecified: Secondary | ICD-10-CM

## 2014-10-29 DIAGNOSIS — I10 Essential (primary) hypertension: Secondary | ICD-10-CM

## 2014-10-29 DIAGNOSIS — E118 Type 2 diabetes mellitus with unspecified complications: Secondary | ICD-10-CM

## 2014-10-29 DIAGNOSIS — M47892 Other spondylosis, cervical region: Secondary | ICD-10-CM | POA: Insufficient documentation

## 2014-10-29 DIAGNOSIS — K219 Gastro-esophageal reflux disease without esophagitis: Secondary | ICD-10-CM | POA: Diagnosis not present

## 2014-10-29 DIAGNOSIS — M1711 Unilateral primary osteoarthritis, right knee: Secondary | ICD-10-CM

## 2014-10-29 DIAGNOSIS — I998 Other disorder of circulatory system: Secondary | ICD-10-CM | POA: Insufficient documentation

## 2014-10-29 DIAGNOSIS — I2510 Atherosclerotic heart disease of native coronary artery without angina pectoris: Secondary | ICD-10-CM

## 2014-10-29 DIAGNOSIS — R002 Palpitations: Secondary | ICD-10-CM

## 2014-10-29 DIAGNOSIS — M5030 Other cervical disc degeneration, unspecified cervical region: Secondary | ICD-10-CM

## 2014-10-29 DIAGNOSIS — E876 Hypokalemia: Secondary | ICD-10-CM

## 2014-10-29 LAB — COMPREHENSIVE METABOLIC PANEL
ALT: 16 U/L (ref 0–35)
AST: 17 U/L (ref 0–37)
Albumin: 4.2 g/dL (ref 3.5–5.2)
Alkaline Phosphatase: 77 U/L (ref 39–117)
BUN: 25 mg/dL — ABNORMAL HIGH (ref 6–23)
CHLORIDE: 102 meq/L (ref 96–112)
CO2: 31 mEq/L (ref 19–32)
Calcium: 9.5 mg/dL (ref 8.4–10.5)
Creatinine, Ser: 1.03 mg/dL (ref 0.40–1.20)
GFR: 55.07 mL/min — ABNORMAL LOW (ref >60.00)
Glucose, Bld: 133 mg/dL — ABNORMAL HIGH (ref 70–99)
POTASSIUM: 4.1 meq/L (ref 3.5–5.1)
Sodium: 139 mEq/L (ref 135–145)
Total Bilirubin: 0.5 mg/dL (ref 0.2–1.2)
Total Protein: 6.7 g/dL (ref 6.0–8.3)

## 2014-10-29 LAB — CBC
HCT: 40.3 % (ref 36.0–46.0)
HEMOGLOBIN: 13.5 g/dL (ref 12.0–15.0)
MCHC: 33.6 g/dL (ref 30.0–36.0)
MCV: 89.2 fl (ref 78.0–100.0)
Platelets: 151 10*3/uL (ref 150.0–400.0)
RBC: 4.51 Mil/uL (ref 3.87–5.11)
RDW: 12.9 % (ref 11.5–15.5)
WBC: 12.4 10*3/uL — AB (ref 4.0–10.5)

## 2014-10-29 MED ORDER — METHOCARBAMOL 500 MG PO TABS: 500.0000 mg | ORAL_TABLET | Freq: Two times a day (BID) | ORAL | Status: DC | PRN

## 2014-10-29 NOTE — Assessment & Plan Note (Signed)
hgba1c acceptable, minimize simple carbs. Increase exercise as tolerated. Continue current meds. Recent fasting sugars the past few days 113, 121, 144 but the 144 was after ice cream the next morning.

## 2014-10-29 NOTE — Patient Instructions (Signed)
Encouraged moist heat and gentle stretching as tolerated. May try NSAIDs and prescription meds as directed and report if symptoms worsen or seek immediate care   Hypertension Hypertension, commonly called high blood pressure, is when the force of blood pumping through your arteries is too strong. Your arteries are the blood vessels that carry blood from your heart throughout your body. A blood pressure reading consists of a higher number over a lower number, such as 110/72. The higher number (systolic) is the pressure inside your arteries when your heart pumps. The lower number (diastolic) is the pressure inside your arteries when your heart relaxes. Ideally you want your blood pressure below 120/80. Hypertension forces your heart to work harder to pump blood. Your arteries may become narrow or stiff. Having hypertension puts you at risk for heart disease, stroke, and other problems.  RISK FACTORS Some risk factors for high blood pressure are controllable. Others are not.  Risk factors you cannot control include:   Race. You may be at higher risk if you are African American.  Age. Risk increases with age.  Gender. Men are at higher risk than women before age 65 years. After age 94, women are at higher risk than men. Risk factors you can control include:  Not getting enough exercise or physical activity.  Being overweight.  Getting too much fat, sugar, calories, or salt in your diet.  Drinking too much alcohol. SIGNS AND SYMPTOMS Hypertension does not usually cause signs or symptoms. Extremely high blood pressure (hypertensive crisis) may cause headache, anxiety, shortness of breath, and nosebleed. DIAGNOSIS  To check if you have hypertension, your health care provider will measure your blood pressure while you are seated, with your arm held at the level of your heart. It should be measured at least twice using the same arm. Certain conditions can cause a difference in blood pressure  between your right and left arms. A blood pressure reading that is higher than normal on one occasion does not mean that you need treatment. If one blood pressure reading is high, ask your health care provider about having it checked again. TREATMENT  Treating high blood pressure includes making lifestyle changes and possibly taking medicine. Living a healthy lifestyle can help lower high blood pressure. You may need to change some of your habits. Lifestyle changes may include:  Following the DASH diet. This diet is high in fruits, vegetables, and whole grains. It is low in salt, red meat, and added sugars.  Getting at least 2 hours of brisk physical activity every week.  Losing weight if necessary.  Not smoking.  Limiting alcoholic beverages.  Learning ways to reduce stress. If lifestyle changes are not enough to get your blood pressure under control, your health care provider may prescribe medicine. You may need to take more than one. Work closely with your health care provider to understand the risks and benefits. HOME CARE INSTRUCTIONS  Have your blood pressure rechecked as directed by your health care provider.   Take medicines only as directed by your health care provider. Follow the directions carefully. Blood pressure medicines must be taken as prescribed. The medicine does not work as well when you skip doses. Skipping doses also puts you at risk for problems.   Do not smoke.   Monitor your blood pressure at home as directed by your health care provider. SEEK MEDICAL CARE IF:   You think you are having a reaction to medicines taken.  You have recurrent headaches or feel dizzy.  You have swelling in your ankles.  You have trouble with your vision. SEEK IMMEDIATE MEDICAL CARE IF:  You develop a severe headache or confusion.  You have unusual weakness, numbness, or feel faint.  You have severe chest or abdominal pain.  You vomit repeatedly.  You have trouble  breathing. MAKE SURE YOU:   Understand these instructions.  Will watch your condition.  Will get help right away if you are not doing well or get worse. Document Released: 06/25/2005 Document Revised: 11/09/2013 Document Reviewed: 04/17/2013 [facility] Patient Information 2015 [place], [place]. This information is not intended to replace advice given to you by your health care provider. Make sure you discuss any questions you have with your health care provider.

## 2014-10-29 NOTE — Assessment & Plan Note (Signed)
Avoid offending foods, start probiotics. Do not eat large meals in late evening and consider raising head of bed.  

## 2014-10-29 NOTE — Progress Notes (Signed)
Pre visit review using our clinic review tool, if applicable. No additional management support is needed unless otherwise documented below in the visit note. 

## 2014-10-29 NOTE — Telephone Encounter (Addendum)
New message     Calling to follow up on preop clearance.  Pt said she talked to Lauren days ago about whether or not she needed an appt to be cleared for upcoming surgery

## 2014-10-29 NOTE — Telephone Encounter (Signed)
I spoke with the pt and made her aware that Dr Burt Knack has cleared her for surgery with Dr Ronnie Derby. I have faxed clearance form to his office.  I made the pt aware that she does not need pre-op clearance appointment with cardiology.

## 2014-10-29 NOTE — Telephone Encounter (Signed)
Labs entered.

## 2014-10-29 NOTE — Assessment & Plan Note (Signed)
H/o DDD, xray today, Encouraged moist heat and gentle stretching as tolerated. May try NSAIDs and prescription meds as directed and report if symptoms worsen or seek immediate care

## 2014-11-01 ENCOUNTER — Telehealth: Payer: Self-pay | Admitting: Family Medicine

## 2014-11-01 NOTE — Telephone Encounter (Signed)
refaxed once signed by PCP and patient informed.

## 2014-11-01 NOTE — Telephone Encounter (Signed)
Caller name: Archita Relation to pt: self Call back number:[phone] ( Pharmacy:   Reason for call:   Patient states that the wrong box was checked off on her forms. She states that the Cardiology box was checked off but the medical box should have been checked off instead. Patient wants this corrected and refaxed to Dr. Lorre Nick office

## 2014-11-01 NOTE — Telephone Encounter (Signed)
This form has not been scanned in her chart.   Shirlean Mylar, do you happen to still have this?

## 2014-11-01 NOTE — Telephone Encounter (Signed)
Form given to Janett Billow and on counter for MD to complete as requested.

## 2014-11-03 ENCOUNTER — Telehealth: Payer: Self-pay | Admitting: Cardiovascular Disease

## 2014-11-03 DIAGNOSIS — I779 Disorder of arteries and arterioles, unspecified: Secondary | ICD-10-CM

## 2014-11-03 DIAGNOSIS — I739 Peripheral vascular disease, unspecified: Principal | ICD-10-CM

## 2014-11-03 NOTE — Telephone Encounter (Signed)
I spoke with the pt and she had cervical x-rays performed that showed carotid disease. The pt is anxious about these findings.  Per Dr Burt Knack we can perform a carotid duplex. Order placed.

## 2014-11-03 NOTE — Telephone Encounter (Signed)
New problem   Pt had some xray's done by PCP doctor and she want to discuss the results with you.

## 2014-11-04 ENCOUNTER — Other Ambulatory Visit (INDEPENDENT_AMBULATORY_CARE_PROVIDER_SITE_OTHER): Payer: Medicare Other

## 2014-11-04 DIAGNOSIS — D72829 Elevated white blood cell count, unspecified: Secondary | ICD-10-CM

## 2014-11-04 LAB — CBC WITH DIFFERENTIAL/PLATELET
BASOS ABS: 0 10*3/uL (ref 0.0–0.1)
Basophils Relative: 0.6 % (ref 0.0–3.0)
EOS ABS: 0.2 10*3/uL (ref 0.0–0.7)
Eosinophils Relative: 4.7 % (ref 0.0–5.0)
HCT: 39.6 % (ref 36.0–46.0)
HEMOGLOBIN: 13.2 g/dL (ref 12.0–15.0)
LYMPHS PCT: 31.1 % (ref 12.0–46.0)
Lymphs Abs: 1.6 10*3/uL (ref 0.7–4.0)
MCHC: 33.3 g/dL (ref 30.0–36.0)
MCV: 89.4 fl (ref 78.0–100.0)
MONO ABS: 0.5 10*3/uL (ref 0.1–1.0)
Monocytes Relative: 9.9 % (ref 3.0–12.0)
Neutro Abs: 2.7 10*3/uL (ref 1.4–7.7)
Neutrophils Relative %: 53.7 % (ref 43.0–77.0)
Platelets: 154 10*3/uL (ref 150.0–400.0)
RBC: 4.43 Mil/uL (ref 3.87–5.11)
RDW: 13.1 % (ref 11.5–15.5)
WBC: 5 10*3/uL (ref 4.0–10.5)

## 2014-11-05 ENCOUNTER — Other Ambulatory Visit: Payer: Self-pay | Admitting: Gynecology

## 2014-11-05 ENCOUNTER — Other Ambulatory Visit: Payer: Medicare Other

## 2014-11-05 DIAGNOSIS — Z1231 Encounter for screening mammogram for malignant neoplasm of breast: Secondary | ICD-10-CM

## 2014-11-05 NOTE — Telephone Encounter (Signed)
Pt scheduled for carotid duplex on 11/09/14.

## 2014-11-07 ENCOUNTER — Other Ambulatory Visit: Payer: Self-pay | Admitting: Orthopedic Surgery

## 2014-11-07 NOTE — Assessment & Plan Note (Signed)
Resolved on check today °

## 2014-11-07 NOTE — Assessment & Plan Note (Signed)
She needs cardiac clearance for her surgery has appt soon.

## 2014-11-07 NOTE — Assessment & Plan Note (Signed)
Patient here for preop clearance to have a right TKR with Dr Ronnie Derby. She is feeling well other than her knee pain, she is anxious to proceed with surgical correction. After reviewing labs today she is medically cleared pending cardiac clearance

## 2014-11-07 NOTE — Assessment & Plan Note (Signed)
No recent episodes

## 2014-11-07 NOTE — Progress Notes (Signed)
Heather [name]  [id number] 1935-04-28 11/07/2014      Progress Note-Follow Up  Subjective  Chief Complaint  Chief Complaint  Patient presents with  . Medical Clearance    HPI  Patient is a 80 y.o. female in today for routine medical care. Patient is in today for preop clearance. Overall she feels well. She continues to struggle with knee pain and uses naproxen sparingly to get through her day but is anxious to proceed with total knee replacement. Is scheduled to have her knee replaced in May with Dr. Ronnie Derby. She denies any recent injury or acute concerns other than her neck pain. She's been noting increased pain in her neck this week. Denies any injury but acknowledges stiffness and symptoms more severe on the right. No radicular weakness or numbness. Denies CP/palp/SOB/HA/congestion/fevers/GI or GU c/o. Taking meds as prescribed  Past Medical History  Diagnosis Date  . Coronary artery disease     post percutaneous coronary intervention in 2006  . Hyperlipidemia   . Restless legs     takes mirapex daily  . Fatigue   . Persistent disorder of initiating or maintaining sleep   . Dermatitis   . S/P inguinal herniorrhaphy   . DJD (degenerative joint disease) of knee 09/18/2011    knees- both, elbow- R, ankles   . Hypertension     takes medications daily  . GERD (gastroesophageal reflux disease)     prilosec daily  . H/O dizziness     pre-syncope/due to dehydration  . Occasional tremors   . Diverticulitis     hx of  . Gastric ulcer     hx of  . Angina   . Atrial fibrillation   . Dysrhythmia, cardiac     "palpitations"  . Borderline diabetes   . H/O hiatal hernia   . Stroke 2000    TIA- series that have resolved-  . Hx of total knee replacement 11/12/11    left knee  . UTI (lower urinary tract infection) 10/04/2013  . Medicare annual wellness visit, subsequent 11/22/2013  . Hip pain 08/28/2011    Past Surgical History  Procedure Laterality Date  . Abdominal hysterectomy     . Knee arthroscopy      2 on left and 1 on right  . Foot surgery      left foot stress fracture repair  . Hernia repair      1989- esophageal hernia   . Upper gastrointestinal endoscopy  2013  . Total knee arthroplasty  11/12/2011    Procedure: TOTAL KNEE ARTHROPLASTY;  Surgeon: Lorn Junes, MD;  Location: [facility];  Service: Orthopedics;  Laterality: Left;  Dr Noemi Chapel wants 90 minutes for this case  . Brain surgery    . Coronary angioplasty  03/2005; 06/2005    "1" plus "1"= "2 total"  . Eye surgery  11-16-13    cataract -right eye    Family History  Problem Relation Age of Onset  . Arthritis      family hx of  . Hypertension      family hx of  . Other      family hx of cardiovascular disorder  . Stroke Father     family hx of M 1st degree relative <50  . Coronary artery disease Mother   . Heart disease Mother   . Depression Brother   . Breast cancer Neg Hx   . Colon cancer Neg Hx   . Anesthesia problems Neg Hx   .  Hypotension Neg Hx   . Malignant hyperthermia Neg Hx   . Pseudochol deficiency Neg Hx   . Stroke Brother   . Diabetes Brother   . Cancer Brother     bladder with mets  . Diabetes Daughter     borderline  . Hypertension Daughter     History   Social History  . Marital Status: Married    Spouse Name: N/A  . Number of Children: N/A  . Years of Education: N/A   Occupational History  . works partime in office    Social History Main Topics  . Smoking status: Never Smoker   . Smokeless tobacco: Never Used  . Alcohol Use: No  . Drug Use: No  . Sexual Activity: Yes    Birth Control/ Protection: Surgical     Comment: lives with husband, no dietary restrictions   Other Topics Concern  . Not on file   Social History Narrative    Current Outpatient Prescriptions on File Prior to Visit  Medication Sig Dispense Refill  . amLODipine (NORVASC) 10 MG tablet Take 0.5 tablets (5 mg total) by mouth daily. 45 tablet 1  . aspirin 81 MG tablet Take 81 mg by  mouth daily.    . Blood Glucose Monitoring Suppl (ONE TOUCH ULTRA SYSTEM KIT) W/DEVICE KIT 1 kit by Does not apply route once. DX: 250.00 Check sugars daily as needed 1 each 0  . Calcium-Vitamin D-Vitamin K 500-100-40 MG-UNT-MCG CHEW Chew 3 capsules by mouth daily.    Marland Kitchen glucose blood (ONE TOUCH TEST STRIPS) test strip Check BS daily and prn DX: E11.9 100 each 11  . isosorbide mononitrate (IMDUR) 60 MG 24 hr tablet Take 0.5 tablets (30 mg total) by mouth daily. 45 tablet 2  . losartan-hydrochlorothiazide (HYZAAR) 100-25 MG per tablet TAKE ONE TABLET BY MOUTH ONCE DAILY 90 tablet 3  . magnesium oxide (MAG-OX) 400 MG tablet Take 400 mg by mouth 2 (two) times daily.     . metFORMIN (GLUCOPHAGE) 500 MG tablet Take 1 tablet (500 mg total) by mouth daily with breakfast. 90 tablet 1  . metoprolol succinate (TOPROL-XL) 50 MG 24 hr tablet TAKE ONE TABLET BY MOUTH TWICE DAILY IMMEDIATELY  FOLOWING  A  MEAL 180 tablet 2  . omeprazole (PRILOSEC) 20 MG capsule Take 20 mg by mouth 2 (two) times daily before a meal.    . ONETOUCH DELICA LANCETS FINE MISC Check sugars daily and prn  DX 250.00 100 each 1  . potassium chloride SA (KLOR-CON M20) 20 MEQ tablet Take 1 tablet (20 mEq total) by mouth daily. 90 tablet 2  . pramipexole (MIRAPEX) 0.5 MG tablet Take 1 tablet (0.5 mg total) by mouth 2 (two) times daily. 60 tablet 3  . simvastatin (ZOCOR) 20 MG tablet TAKE ONE TABLET BY MOUTH AT BEDTIME 90 tablet 3  . [DISCONTINUED] nitroGLYCERIN (NITROSTAT) 0.4 MG SL tablet Place 0.4 mg under the tongue every 5 (five) minutes as needed.       No current facility-administered medications on file prior to visit.    No Known Allergies  Review of Systems  Review of Systems  Constitutional: Negative for fever, chills and malaise/fatigue.  HENT: Negative for congestion, hearing loss and nosebleeds.   Eyes: Negative for discharge.  Respiratory: Negative for cough, sputum production, shortness of breath and wheezing.     Cardiovascular: Negative for chest pain, palpitations and leg swelling.  Gastrointestinal: Negative for heartburn, nausea, vomiting, abdominal pain, diarrhea, constipation and blood in stool.  Genitourinary: Negative for dysuria, urgency, frequency and hematuria.  Musculoskeletal: Positive for joint pain. Negative for myalgias, back pain and falls.       Right knee pain   Skin: Negative for rash.  Neurological: Negative for dizziness, tremors, sensory change, focal weakness, loss of consciousness, weakness and headaches.  Endo/Heme/Allergies: Negative for polydipsia. Does not bruise/bleed easily.  Psychiatric/Behavioral: Negative for depression and suicidal ideas. The patient is not nervous/anxious and does not have insomnia.     Objective  BP 138/78 mmHg  Pulse 69  Temp(Src) 98.6 F (37 C) (Oral)  Ht '5\' 3"'  (1.6 m)  Wt 162 lb 8 oz (73.71 kg)  BMI 28.79 kg/m2  SpO2 99%  Physical Exam  Physical Exam  Constitutional: She is oriented to person, place, and time and well-developed, well-nourished, and in no distress. No distress.  HENT:  Head: Normocephalic and atraumatic.  Eyes: Conjunctivae are normal.  Neck: Neck supple. No thyromegaly present.  Cardiovascular: Normal rate, regular rhythm and normal heart sounds.   No murmur heard. Pulmonary/Chest: Effort normal and breath sounds normal. She has no wheezes.  Abdominal: She exhibits no distension and no mass.  Musculoskeletal: She exhibits no edema.  Lymphadenopathy:    She has no cervical adenopathy.  Neurological: She is alert and oriented to person, place, and time.  Skin: Skin is warm and dry. No rash noted. She is not diaphoretic.  Psychiatric: Memory, affect and judgment normal.    Lab Results  Component Value Date   TSH 1.58 08/19/2014   Lab Results  Component Value Date   WBC 5.0 11/04/2014   HGB 13.2 11/04/2014   HCT 39.6 11/04/2014   MCV 89.4 11/04/2014   PLT 154.0 11/04/2014   Lab Results  Component  Value Date   CREATININE 1.03 10/29/2014   BUN 25* 10/29/2014   NA 139 10/29/2014   K 4.1 10/29/2014   CL 102 10/29/2014   CO2 31 10/29/2014   Lab Results  Component Value Date   ALT 16 10/29/2014   AST 17 10/29/2014   ALKPHOS 77 10/29/2014   BILITOT 0.5 10/29/2014   Lab Results  Component Value Date   CHOL 149 08/19/2014   Lab Results  Component Value Date   HDL 55.60 08/19/2014   Lab Results  Component Value Date   LDLCALC 65 08/19/2014   Lab Results  Component Value Date   TRIG 140.0 08/19/2014   Lab Results  Component Value Date   CHOLHDL 3 08/19/2014     Assessment & Plan  GERD Avoid offending foods, start probiotics. Do not eat large meals in late evening and consider raising head of bed.    DM (diabetes mellitus) hgba1c acceptable, minimize simple carbs. Increase exercise as tolerated. Continue current meds. Recent fasting sugars the past few days 113, 121, 144 but the 144 was after ice cream the next morning.   Neck pain H/o DDD, xray today, Encouraged moist heat and gentle stretching as tolerated. May try NSAIDs and prescription meds as directed and report if symptoms worsen or seek immediate care   DJD (degenerative joint disease) of knee Patient here for preop clearance to have a right TKR with Dr Ronnie Derby. She is feeling well other than her knee pain, she is anxious to proceed with surgical correction. After reviewing labs today she is medically cleared pending cardiac clearance   PALPITATIONS No recent episodes   Hypokalemia Resolved on check today   Anemia resolved   Coronary atherosclerosis She needs cardiac clearance for  her surgery has appt soon.

## 2014-11-07 NOTE — Assessment & Plan Note (Signed)
resolved 

## 2014-11-08 ENCOUNTER — Other Ambulatory Visit: Payer: Self-pay | Admitting: Family Medicine

## 2014-11-08 MED ORDER — METHYLPREDNISOLONE 4 MG PO TBPK: ORAL_TABLET | ORAL | Status: DC

## 2014-11-08 NOTE — Telephone Encounter (Signed)
Patient informed medrol pack sent in to pharmacy.

## 2014-11-09 ENCOUNTER — Other Ambulatory Visit (HOSPITAL_COMMUNITY): Payer: Self-pay | Admitting: Cardiovascular Disease

## 2014-11-09 ENCOUNTER — Ambulatory Visit (HOSPITAL_COMMUNITY): Payer: Medicare Other | Attending: Internal Medicine

## 2014-11-09 DIAGNOSIS — I2510 Atherosclerotic heart disease of native coronary artery without angina pectoris: Secondary | ICD-10-CM | POA: Insufficient documentation

## 2014-11-09 DIAGNOSIS — I739 Peripheral vascular disease, unspecified: Secondary | ICD-10-CM

## 2014-11-09 DIAGNOSIS — I10 Essential (primary) hypertension: Secondary | ICD-10-CM | POA: Diagnosis not present

## 2014-11-09 DIAGNOSIS — I6523 Occlusion and stenosis of bilateral carotid arteries: Secondary | ICD-10-CM

## 2014-11-09 DIAGNOSIS — E119 Type 2 diabetes mellitus without complications: Secondary | ICD-10-CM | POA: Insufficient documentation

## 2014-11-09 DIAGNOSIS — E785 Hyperlipidemia, unspecified: Secondary | ICD-10-CM | POA: Diagnosis not present

## 2014-11-09 DIAGNOSIS — I779 Disorder of arteries and arterioles, unspecified: Secondary | ICD-10-CM

## 2014-11-15 ENCOUNTER — Other Ambulatory Visit: Payer: Self-pay

## 2014-11-15 DIAGNOSIS — R9389 Abnormal findings on diagnostic imaging of other specified body structures: Secondary | ICD-10-CM

## 2014-11-15 NOTE — Pre-Procedure Instructions (Signed)
[name]  11/15/2014   Your procedure is scheduled on:  11/29/14  Report to [facility] Admitting at 530 AM.  Call this number if you have problems the morning of surgery: [phone]   Remember:   Do not eat food or drink liquids after midnight.   Take these medicines the morning of surgery with A SIP OF WATER: amlopdipine,imdur,toprol,prilosec   Do not wear jewelry, make-up or nail polish.  Do not wear lotions, powders, or perfumes. You may wear deodorant.  Do not shave 48 hours prior to surgery. Men may shave face and neck.  Do not bring valuables to the hospital.  [facility] is not responsible                  for any belongings or valuables.               Contacts, dentures or bridgework may not be worn into surgery.  Leave suitcase in the car. After surgery it may be brought to your room.  For patients admitted to the hospital, discharge time is determined by your                treatment team.               Patients discharged the day of surgery will not be allowed to drive  home.  Name and phone number of your driver: family  Special Instructions: Shower using CHG 2 nights before surgery and the night before surgery.  If you shower the day of surgery use CHG.  Use special wash - you have one bottle of CHG for all showers.  You should use approximately 1/3 of the bottle for each shower.   Please read over the following fact sheets that you were given: Pain Booklet, Coughing and Deep Breathing, Blood Transfusion Information, MRSA Information and Surgical Site Infection Prevention

## 2014-11-16 ENCOUNTER — Encounter (HOSPITAL_COMMUNITY)
Admission: RE | Admit: 2014-11-16 | Discharge: 2014-11-16 | Disposition: A | Discharge status: Home / Self Care | Payer: Medicare Other | Source: Ambulatory Visit | Attending: Orthopedic Surgery | Admitting: Orthopedic Surgery

## 2014-11-16 ENCOUNTER — Encounter (HOSPITAL_COMMUNITY): Payer: Self-pay

## 2014-11-16 DIAGNOSIS — I10 Essential (primary) hypertension: Secondary | ICD-10-CM | POA: Insufficient documentation

## 2014-11-16 DIAGNOSIS — M1711 Unilateral primary osteoarthritis, right knee: Secondary | ICD-10-CM | POA: Insufficient documentation

## 2014-11-16 DIAGNOSIS — E785 Hyperlipidemia, unspecified: Secondary | ICD-10-CM | POA: Insufficient documentation

## 2014-11-16 DIAGNOSIS — Z01812 Encounter for preprocedural laboratory examination: Secondary | ICD-10-CM | POA: Insufficient documentation

## 2014-11-16 HISTORY — DX: Type 2 diabetes mellitus without complications: E11.9

## 2014-11-16 LAB — CBC WITH DIFFERENTIAL/PLATELET
BASOS ABS: 0 10*3/uL (ref 0.0–0.1)
Basophils Relative: 0 % (ref 0–1)
EOS PCT: 3 % (ref 0–5)
Eosinophils Absolute: 0.2 10*3/uL (ref 0.0–0.7)
HCT: 41.5 % (ref 36.0–46.0)
Hemoglobin: 13.9 g/dL (ref 12.0–15.0)
LYMPHS PCT: 24 % (ref 12–46)
Lymphs Abs: 1.7 10*3/uL (ref 0.7–4.0)
MCH: 30 pg (ref 26.0–34.0)
MCHC: 33.5 g/dL (ref 30.0–36.0)
MCV: 89.6 fL (ref 78.0–100.0)
Monocytes Absolute: 0.7 10*3/uL (ref 0.1–1.0)
Monocytes Relative: 10 % (ref 3–12)
NEUTROS ABS: 4.4 10*3/uL (ref 1.7–7.7)
NEUTROS PCT: 63 % (ref 43–77)
PLATELETS: 176 10*3/uL (ref 150–400)
RBC: 4.63 MIL/uL (ref 3.87–5.11)
RDW: 13.1 % (ref 11.5–15.5)
WBC: 7 10*3/uL (ref 4.0–10.5)

## 2014-11-16 LAB — COMPREHENSIVE METABOLIC PANEL
ALK PHOS: 87 U/L (ref 38–126)
ALT: 22 U/L (ref 14–54)
AST: 18 U/L (ref 15–41)
Albumin: 3.6 g/dL (ref 3.5–5.0)
Anion gap: 9 (ref 5–15)
BILIRUBIN TOTAL: 0.9 mg/dL (ref 0.3–1.2)
BUN: 17 mg/dL (ref 6–20)
CALCIUM: 9 mg/dL (ref 8.9–10.3)
CO2: 26 mmol/L (ref 22–32)
CREATININE: 0.92 mg/dL (ref 0.44–1.00)
Chloride: 103 mmol/L (ref 101–111)
GFR calc Af Amer: >60 mL/min (ref >60)
GFR, EST NON AFRICAN AMERICAN: 58 mL/min — AB (ref >60)
Glucose, Bld: 159 mg/dL — ABNORMAL HIGH (ref 70–99)
POTASSIUM: 3.9 mmol/L (ref 3.5–5.1)
Sodium: 138 mmol/L (ref 135–145)
TOTAL PROTEIN: 6 g/dL — AB (ref 6.5–8.1)

## 2014-11-16 LAB — PROTIME-INR
INR: 0.98 (ref 0.00–1.49)
PROTHROMBIN TIME: 13.1 s (ref 11.6–15.2)

## 2014-11-16 LAB — URINALYSIS, ROUTINE W REFLEX MICROSCOPIC
BILIRUBIN URINE: NEGATIVE
Glucose, UA: NEGATIVE mg/dL
Hgb urine dipstick: NEGATIVE
Ketones, ur: NEGATIVE mg/dL
Leukocytes, UA: NEGATIVE
NITRITE: NEGATIVE
Protein, ur: NEGATIVE mg/dL
SPECIFIC GRAVITY, URINE: 1.009 (ref 1.005–1.030)
UROBILINOGEN UA: 0.2 mg/dL (ref 0.0–1.0)
pH: 8 (ref 5.0–8.0)

## 2014-11-16 LAB — SURGICAL PCR SCREEN
MRSA, PCR: NEGATIVE
Staphylococcus aureus: NEGATIVE

## 2014-11-16 LAB — APTT: APTT: 28 s (ref 24–37)

## 2014-11-16 MED ORDER — SODIUM CHLORIDE 0.9 % IV SOLN: INTRAVENOUS | Status: DC

## 2014-11-16 NOTE — Progress Notes (Signed)
Anesthesia Chart Review:  Pt is 80 year old female scheduled for R total knee arthroplasty on 11/29/2014 with Dr. Ronnie Derby.   Cardiologist is Dr. Burt Knack, last office visit 04/30/2014. PCP is Dr. Willette Alma  PMH includes: CAD (s/p DES to LCx and RCA 2006), HTN, palpitations, atrial fibrillation, stroke (2000), hyperlipidemia, DM. Never smoker. BMI 29. S/p L TKA 11/12/11.   Medications include: ASA, amlodipine, imdur, losartan-hctz, metformin, metoprolol, mirapex, simvastatin.   Preoperative labs reviewed.    Chest x-ray 07/03/2014 reviewed. No active cardiopulmonary disease.   EKG 04/30/2014: sinus rhythm with fusion complexes. Incomplete RBBB. Moderate voltage criteria for LVH, may be normal variant.   Stress echo 10/24/2011: - Stress ECG conclusions: There were no stress arrhythmias or conduction abnormalities. The stress ECG was negative for ischemia. - Staged echo: There was no echocardiographic evidence for stress-induced ischemia.  Cardiac cath 03/16/2005: 1. Ventriculography was done the RAO projection. Overall systolic function was preserved. No segmental abnormalities or contraction were identified. 2. The left main is free of critical disease. 3. The LAD has some calcification. There is about 70% to 75% stenosis which was brought out by intracoronary nitroglycerin at or involving the takeoff of the diagonal. The diagonal itself has a segmental area of 60% narrowing throughout the proximal portion of the vessel leading up to the ostium. 4. The circumflex has a small proximal marginal branch and then a second marginal branch and then is subtotally occluded. This is a 95% stenosis followed by an area of some segmental disease. Following stenting with the TAXUS 24 x 2.75 drug-eluting stent and post-dilatation with a 3.25 Quantum Maverick balloon, this was reduced to 0%. The distal vessel is a large-caliber vessel that is free of critical disease. 5. The right coronary artery is  a fairly large-caliber vessel with a segmental area of 70% to 75% narrowing in the midvessel. This terminates as the PDA and posterolateral branch which are free of critical disease. CONCLUSIONS: Successful percutaneous stenting of the circumflex coronaryartery with residual disease involving both the right coronary artery andleft anterior descending. DISPOSITION: I have reviewed the case in depth with Dr. Domenic Polite. She hasbeen successfully stented and is on chronic Plavix therapy. Hopefully, shewill continue to improve. Functional testing will be undertaken to assessthe right coronary artery and LAD. The right is fairly favorable forpercutaneous intervention and will require a drug-eluting stent. The LAD isslightly more problematic, but the LAD should be able to be approached percutaneously as well, if this is the ultimate plan.  Cardiac cath 06/27/2005: 1. Successful percutaneous intervention of the right coronary artery. 2. Previous successful percutaneous intervention of the circumflex coronary artery with continued patency. 3. Continued moderately high-grade left anterior descending artery disease in the midvessel, but with a small-caliber vessel and overlapping side branch at the location.  Pt has medical clearance from Dr. [name] in [place] note dated 11/07/2014. Pt has cardiac clearance from Dr. Burt Knack in [place] telephone encounter by Theodosia Quay, RN dated 10/29/2014.   If no changes, I anticipate pt can proceed with surgery as scheduled.   Willeen Cass, FNP-BC [facility]/Anesthesiology Phone: [phone] 11/16/2014 3:29 PM

## 2014-11-17 ENCOUNTER — Ambulatory Visit: Payer: Medicare Other | Admitting: Physician Assistant

## 2014-11-17 LAB — URINE CULTURE

## 2014-11-19 DIAGNOSIS — M1711 Unilateral primary osteoarthritis, right knee: Secondary | ICD-10-CM | POA: Diagnosis not present

## 2014-11-19 DIAGNOSIS — M179 Osteoarthritis of knee, unspecified: Secondary | ICD-10-CM | POA: Insufficient documentation

## 2014-11-19 DIAGNOSIS — M1710 Unilateral primary osteoarthritis, unspecified knee: Secondary | ICD-10-CM | POA: Insufficient documentation

## 2014-11-19 DIAGNOSIS — M25561 Pain in right knee: Secondary | ICD-10-CM | POA: Insufficient documentation

## 2014-11-22 ENCOUNTER — Ambulatory Visit (HOSPITAL_COMMUNITY)
Admission: RE | Admit: 2014-11-22 | Discharge: 2014-11-22 | Disposition: A | Discharge status: Home / Self Care | Payer: Medicare Other | Source: Ambulatory Visit | Attending: Cardiovascular Disease | Admitting: Cardiovascular Disease

## 2014-11-22 ENCOUNTER — Ambulatory Visit
Admission: RE | Admit: 2014-11-22 | Discharge: 2014-11-22 | Disposition: A | Discharge status: Home / Self Care | Payer: Medicare Other | Source: Ambulatory Visit | Attending: Gynecology | Admitting: Gynecology

## 2014-11-22 ENCOUNTER — Ambulatory Visit: Payer: Medicare Other | Admitting: Family Medicine

## 2014-11-22 DIAGNOSIS — Z1231 Encounter for screening mammogram for malignant neoplasm of breast: Secondary | ICD-10-CM | POA: Diagnosis not present

## 2014-11-22 DIAGNOSIS — R946 Abnormal results of thyroid function studies: Secondary | ICD-10-CM | POA: Diagnosis present

## 2014-11-22 DIAGNOSIS — E042 Nontoxic multinodular goiter: Secondary | ICD-10-CM | POA: Diagnosis not present

## 2014-11-22 DIAGNOSIS — R9389 Abnormal findings on diagnostic imaging of other specified body structures: Secondary | ICD-10-CM

## 2014-11-23 ENCOUNTER — Other Ambulatory Visit: Payer: Self-pay | Admitting: Family Medicine

## 2014-11-23 DIAGNOSIS — E042 Nontoxic multinodular goiter: Secondary | ICD-10-CM

## 2014-11-23 NOTE — Progress Notes (Addendum)
Please notify patient thyroid nodules noted incidentally on recent carotid ultrasounds, have referred to endocrinology for possible FNA   Patient has been contacted regarding referral to Endo. Shirlean Mylar Ewing CMA 11/23/14

## 2014-11-26 ENCOUNTER — Ambulatory Visit: Payer: Medicare Other

## 2014-11-26 MED ORDER — TRANEXAMIC ACID 1000 MG/10ML IV SOLN
1000.0000 mg | INTRAVENOUS | Status: DC
  Filled 2014-11-26: qty 10

## 2014-11-26 MED ORDER — BUPIVACAINE LIPOSOME 1.3 % IJ SUSP
20.0000 mL | Freq: Once | INTRAMUSCULAR | Status: DC
  Filled 2014-11-26: qty 20

## 2014-11-29 ENCOUNTER — Inpatient Hospital Stay (HOSPITAL_COMMUNITY)
Admission: RE | Admit: 2014-11-29 | Discharge: 2014-11-30 | DRG: 470 | Disposition: A | Discharge status: Home Health | Payer: Medicare Other | Source: Ambulatory Visit | Attending: Orthopedic Surgery | Admitting: Orthopedic Surgery

## 2014-11-29 ENCOUNTER — Encounter (HOSPITAL_COMMUNITY): Payer: Self-pay | Admitting: Anesthesiology

## 2014-11-29 ENCOUNTER — Inpatient Hospital Stay (HOSPITAL_COMMUNITY): Payer: Medicare Other | Admitting: Emergency Medicine

## 2014-11-29 ENCOUNTER — Inpatient Hospital Stay (HOSPITAL_COMMUNITY): Payer: Medicare Other | Admitting: Anesthesiology

## 2014-11-29 ENCOUNTER — Encounter (HOSPITAL_COMMUNITY)
Admission: RE | Disposition: A | Discharge status: Home / Self Care | Payer: Self-pay | Source: Ambulatory Visit | Attending: Orthopedic Surgery

## 2014-11-29 DIAGNOSIS — F419 Anxiety disorder, unspecified: Secondary | ICD-10-CM | POA: Diagnosis present

## 2014-11-29 DIAGNOSIS — M25561 Pain in right knee: Secondary | ICD-10-CM | POA: Diagnosis not present

## 2014-11-29 DIAGNOSIS — Z9841 Cataract extraction status, right eye: Secondary | ICD-10-CM

## 2014-11-29 DIAGNOSIS — D62 Acute posthemorrhagic anemia: Secondary | ICD-10-CM | POA: Diagnosis not present

## 2014-11-29 DIAGNOSIS — G2581 Restless legs syndrome: Secondary | ICD-10-CM | POA: Diagnosis present

## 2014-11-29 DIAGNOSIS — I10 Essential (primary) hypertension: Secondary | ICD-10-CM | POA: Diagnosis present

## 2014-11-29 DIAGNOSIS — E119 Type 2 diabetes mellitus without complications: Secondary | ICD-10-CM | POA: Diagnosis present

## 2014-11-29 DIAGNOSIS — G4700 Insomnia, unspecified: Secondary | ICD-10-CM | POA: Diagnosis present

## 2014-11-29 DIAGNOSIS — M1711 Unilateral primary osteoarthritis, right knee: Principal | ICD-10-CM | POA: Diagnosis present

## 2014-11-29 DIAGNOSIS — I2510 Atherosclerotic heart disease of native coronary artery without angina pectoris: Secondary | ICD-10-CM | POA: Diagnosis present

## 2014-11-29 DIAGNOSIS — E785 Hyperlipidemia, unspecified: Secondary | ICD-10-CM | POA: Diagnosis present

## 2014-11-29 DIAGNOSIS — Z7982 Long term (current) use of aspirin: Secondary | ICD-10-CM | POA: Diagnosis not present

## 2014-11-29 DIAGNOSIS — Z87440 Personal history of urinary (tract) infections: Secondary | ICD-10-CM | POA: Diagnosis not present

## 2014-11-29 DIAGNOSIS — Z96652 Presence of left artificial knee joint: Secondary | ICD-10-CM | POA: Diagnosis present

## 2014-11-29 DIAGNOSIS — Z90710 Acquired absence of both cervix and uterus: Secondary | ICD-10-CM

## 2014-11-29 DIAGNOSIS — Z8673 Personal history of transient ischemic attack (TIA), and cerebral infarction without residual deficits: Secondary | ICD-10-CM

## 2014-11-29 DIAGNOSIS — K219 Gastro-esophageal reflux disease without esophagitis: Secondary | ICD-10-CM | POA: Diagnosis present

## 2014-11-29 DIAGNOSIS — G8918 Other acute postprocedural pain: Secondary | ICD-10-CM | POA: Diagnosis not present

## 2014-11-29 DIAGNOSIS — Z96659 Presence of unspecified artificial knee joint: Secondary | ICD-10-CM

## 2014-11-29 DIAGNOSIS — M179 Osteoarthritis of knee, unspecified: Secondary | ICD-10-CM | POA: Diagnosis not present

## 2014-11-29 DIAGNOSIS — I4891 Unspecified atrial fibrillation: Secondary | ICD-10-CM | POA: Diagnosis present

## 2014-11-29 HISTORY — PX: TOTAL KNEE ARTHROPLASTY: SHX125

## 2014-11-29 LAB — CBC
HCT: 38.3 % (ref 36.0–46.0)
Hemoglobin: 12.4 g/dL (ref 12.0–15.0)
MCH: 29.5 pg (ref 26.0–34.0)
MCHC: 32.4 g/dL (ref 30.0–36.0)
MCV: 91 fL (ref 78.0–100.0)
Platelets: 157 10*3/uL (ref 150–400)
RBC: 4.21 MIL/uL (ref 3.87–5.11)
RDW: 13.2 % (ref 11.5–15.5)
WBC: 9.3 10*3/uL (ref 4.0–10.5)

## 2014-11-29 LAB — GLUCOSE, CAPILLARY
GLUCOSE-CAPILLARY: 146 mg/dL — AB (ref 65–99)
Glucose-Capillary: 162 mg/dL — ABNORMAL HIGH (ref 65–99)
Glucose-Capillary: 238 mg/dL — ABNORMAL HIGH (ref 65–99)
Glucose-Capillary: 172 mg/dL — ABNORMAL HIGH (ref 65–99)
Glucose-Capillary: 217 mg/dL — ABNORMAL HIGH (ref 65–99)

## 2014-11-29 LAB — CREATININE, SERUM
CREATININE: 0.95 mg/dL (ref 0.44–1.00)
GFR calc Af Amer: >60 mL/min (ref >60)
GFR, EST NON AFRICAN AMERICAN: 56 mL/min — AB (ref >60)

## 2014-11-29 SURGERY — ARTHROPLASTY, KNEE, TOTAL
Anesthesia: General | Site: Knee | Laterality: Right

## 2014-11-29 MED ORDER — OXYCODONE HCL 5 MG PO TABS
5.0000 mg | ORAL_TABLET | ORAL | Status: DC | PRN
  Administered 2014-11-30: 10 mg via ORAL
  Filled 2014-11-29: qty 2

## 2014-11-29 MED ORDER — FLEET ENEMA 7-19 GM/118ML RE ENEM: 1.0000 | ENEMA | Freq: Once | RECTAL | Status: AC | PRN

## 2014-11-29 MED ORDER — AMLODIPINE BESYLATE 5 MG PO TABS
5.0000 mg | ORAL_TABLET | Freq: Every day | ORAL | Status: DC
  Administered 2014-11-30: 5 mg via ORAL
  Filled 2014-11-29: qty 1

## 2014-11-29 MED ORDER — OXYCODONE HCL ER 10 MG PO T12A
10.0000 mg | EXTENDED_RELEASE_TABLET | Freq: Two times a day (BID) | ORAL | Status: DC
  Administered 2014-11-29 – 2014-11-30 (×3): 10 mg via ORAL
  Filled 2014-11-29 (×4): qty 1

## 2014-11-29 MED ORDER — SODIUM CHLORIDE 0.9 % IV SOLN
INTRAVENOUS | Status: DC
  Administered 2014-11-29: 21:00:00 via INTRAVENOUS

## 2014-11-29 MED ORDER — CEFAZOLIN SODIUM 1-5 GM-% IV SOLN
1.0000 g | Freq: Four times a day (QID) | INTRAVENOUS | Status: AC
  Administered 2014-11-29 (×2): 1 g via INTRAVENOUS
  Filled 2014-11-29 (×2): qty 50

## 2014-11-29 MED ORDER — SODIUM CHLORIDE 0.9 % IR SOLN
Status: DC | PRN
  Administered 2014-11-29 (×2): 1000 mL

## 2014-11-29 MED ORDER — ONDANSETRON HCL 4 MG PO TABS: 4.0000 mg | ORAL_TABLET | Freq: Four times a day (QID) | ORAL | Status: DC | PRN

## 2014-11-29 MED ORDER — TRANEXAMIC ACID 1000 MG/10ML IV SOLN
2000.0000 mg | INTRAVENOUS | Status: DC | PRN
  Administered 2014-11-29: 2000 mg via INTRAVENOUS

## 2014-11-29 MED ORDER — PHENYLEPHRINE HCL 10 MG/ML IJ SOLN
INTRAMUSCULAR | Status: DC | PRN
  Administered 2014-11-29 (×3): 40 ug via INTRAVENOUS

## 2014-11-29 MED ORDER — HYDROMORPHONE HCL 1 MG/ML IJ SOLN: 0.5000 mg | INTRAMUSCULAR | Status: DC | PRN

## 2014-11-29 MED ORDER — SENNOSIDES-DOCUSATE SODIUM 8.6-50 MG PO TABS: 1.0000 | ORAL_TABLET | Freq: Every evening | ORAL | Status: DC | PRN

## 2014-11-29 MED ORDER — BUPIVACAINE LIPOSOME 1.3 % IJ SUSP
INTRAMUSCULAR | Status: DC | PRN
  Administered 2014-11-29: 20 mL

## 2014-11-29 MED ORDER — CHLORHEXIDINE GLUCONATE 4 % EX LIQD
60.0000 mL | Freq: Once | CUTANEOUS | Status: DC
  Filled 2014-11-29: qty 60

## 2014-11-29 MED ORDER — ONDANSETRON HCL 4 MG/2ML IJ SOLN
INTRAMUSCULAR | Status: AC
Start: 2014-11-29 — End: 2014-11-29
  Filled 2014-11-29: qty 2

## 2014-11-29 MED ORDER — METOCLOPRAMIDE HCL 5 MG/ML IJ SOLN: 5.0000 mg | Freq: Three times a day (TID) | INTRAMUSCULAR | Status: DC | PRN

## 2014-11-29 MED ORDER — METHOCARBAMOL 500 MG PO TABS
500.0000 mg | ORAL_TABLET | Freq: Four times a day (QID) | ORAL | Status: DC | PRN
  Administered 2014-11-30: 500 mg via ORAL
  Filled 2014-11-29 (×3): qty 1

## 2014-11-29 MED ORDER — SIMVASTATIN 20 MG PO TABS
20.0000 mg | ORAL_TABLET | Freq: Every day | ORAL | Status: DC
  Administered 2014-11-29: 20 mg via ORAL
  Filled 2014-11-29: qty 1

## 2014-11-29 MED ORDER — CELECOXIB 200 MG PO CAPS
200.0000 mg | ORAL_CAPSULE | Freq: Two times a day (BID) | ORAL | Status: DC
  Administered 2014-11-29 – 2014-11-30 (×3): 200 mg via ORAL
  Filled 2014-11-29 (×3): qty 1

## 2014-11-29 MED ORDER — ONDANSETRON HCL 4 MG/2ML IJ SOLN
INTRAMUSCULAR | Status: DC | PRN
  Administered 2014-11-29: 4 mg via INTRAVENOUS

## 2014-11-29 MED ORDER — POTASSIUM CHLORIDE CRYS ER 20 MEQ PO TBCR
20.0000 meq | EXTENDED_RELEASE_TABLET | Freq: Every day | ORAL | Status: DC
  Administered 2014-11-30: 20 meq via ORAL
  Filled 2014-11-29: qty 1

## 2014-11-29 MED ORDER — ACETAMINOPHEN 650 MG RE SUPP: 650.0000 mg | Freq: Four times a day (QID) | RECTAL | Status: DC | PRN

## 2014-11-29 MED ORDER — METOPROLOL SUCCINATE ER 50 MG PO TB24
50.0000 mg | ORAL_TABLET | Freq: Every day | ORAL | Status: DC
  Administered 2014-11-30: 50 mg via ORAL
  Filled 2014-11-29: qty 1

## 2014-11-29 MED ORDER — LOSARTAN POTASSIUM-HCTZ 100-25 MG PO TABS: 1.0000 | ORAL_TABLET | Freq: Every day | ORAL | Status: DC

## 2014-11-29 MED ORDER — BISACODYL 5 MG PO TBEC
5.0000 mg | DELAYED_RELEASE_TABLET | Freq: Every day | ORAL | Status: DC | PRN
  Administered 2014-11-29 – 2014-11-30 (×2): 5 mg via ORAL
  Filled 2014-11-29 (×2): qty 1

## 2014-11-29 MED ORDER — ENOXAPARIN SODIUM 30 MG/0.3ML ~~LOC~~ SOLN
30.0000 mg | Freq: Two times a day (BID) | SUBCUTANEOUS | Status: DC
  Administered 2014-11-29 – 2014-11-30 (×2): 30 mg via SUBCUTANEOUS
  Filled 2014-11-29 (×2): qty 0.3

## 2014-11-29 MED ORDER — DIPHENHYDRAMINE HCL 12.5 MG/5ML PO ELIX: 12.5000 mg | ORAL_SOLUTION | ORAL | Status: DC | PRN

## 2014-11-29 MED ORDER — MENTHOL 3 MG MT LOZG: 1.0000 | LOZENGE | OROMUCOSAL | Status: DC | PRN

## 2014-11-29 MED ORDER — METOCLOPRAMIDE HCL 5 MG PO TABS: 5.0000 mg | ORAL_TABLET | Freq: Three times a day (TID) | ORAL | Status: DC | PRN

## 2014-11-29 MED ORDER — LACTATED RINGERS IV SOLN
INTRAVENOUS | Status: DC | PRN
  Administered 2014-11-29 (×2): via INTRAVENOUS

## 2014-11-29 MED ORDER — CEFAZOLIN SODIUM-DEXTROSE 2-3 GM-% IV SOLR
2.0000 g | INTRAVENOUS | Status: DC
  Filled 2014-11-29: qty 50

## 2014-11-29 MED ORDER — ARTIFICIAL TEARS OP OINT
TOPICAL_OINTMENT | OPHTHALMIC | Status: AC
  Filled 2014-11-29: qty 3.5

## 2014-11-29 MED ORDER — ALUM & MAG HYDROXIDE-SIMETH 200-200-20 MG/5ML PO SUSP: 30.0000 mL | ORAL | Status: DC | PRN

## 2014-11-29 MED ORDER — BUPIVACAINE-EPINEPHRINE (PF) 0.5% -1:200000 IJ SOLN
30.0000 mL | INTRAMUSCULAR | Status: DC
  Filled 2014-11-29: qty 30

## 2014-11-29 MED ORDER — ISOSORBIDE MONONITRATE ER 30 MG PO TB24
30.0000 mg | ORAL_TABLET | Freq: Every day | ORAL | Status: DC
  Administered 2014-11-30: 30 mg via ORAL
  Filled 2014-11-29 (×2): qty 1

## 2014-11-29 MED ORDER — 0.9 % SODIUM CHLORIDE (POUR BTL) OPTIME
TOPICAL | Status: DC | PRN
  Administered 2014-11-29: 1000 mL

## 2014-11-29 MED ORDER — METHOCARBAMOL 1000 MG/10ML IJ SOLN
500.0000 mg | Freq: Four times a day (QID) | INTRAVENOUS | Status: DC | PRN
  Administered 2014-11-29: 500 mg via INTRAVENOUS
  Filled 2014-11-29 (×3): qty 5

## 2014-11-29 MED ORDER — LOSARTAN POTASSIUM 50 MG PO TABS
100.0000 mg | ORAL_TABLET | Freq: Every day | ORAL | Status: DC
Start: 2014-11-29 — End: 2014-11-30
  Administered 2014-11-29 – 2014-11-30 (×2): 100 mg via ORAL
  Filled 2014-11-29 (×2): qty 2

## 2014-11-29 MED ORDER — DOCUSATE SODIUM 100 MG PO CAPS
100.0000 mg | ORAL_CAPSULE | Freq: Two times a day (BID) | ORAL | Status: DC
  Administered 2014-11-29 – 2014-11-30 (×3): 100 mg via ORAL
  Filled 2014-11-29 (×3): qty 1

## 2014-11-29 MED ORDER — DEXAMETHASONE SODIUM PHOSPHATE 4 MG/ML IJ SOLN
INTRAMUSCULAR | Status: DC | PRN
  Administered 2014-11-29: 4 mg via INTRAVENOUS

## 2014-11-29 MED ORDER — INSULIN ASPART 100 UNIT/ML ~~LOC~~ SOLN
0.0000 [IU] | Freq: Three times a day (TID) | SUBCUTANEOUS | Status: DC
  Administered 2014-11-29 (×2): 5 [IU] via SUBCUTANEOUS
  Administered 2014-11-30 (×2): 2 [IU] via SUBCUTANEOUS

## 2014-11-29 MED ORDER — LIDOCAINE HCL (CARDIAC) 20 MG/ML IV SOLN
INTRAVENOUS | Status: DC | PRN
  Administered 2014-11-29: 100 mg via INTRAVENOUS

## 2014-11-29 MED ORDER — ONDANSETRON HCL 4 MG/2ML IJ SOLN: 4.0000 mg | Freq: Once | INTRAMUSCULAR | Status: DC | PRN

## 2014-11-29 MED ORDER — HYDROMORPHONE HCL 1 MG/ML IJ SOLN: 1.0000 mg | INTRAMUSCULAR | Status: DC | PRN

## 2014-11-29 MED ORDER — CEFAZOLIN SODIUM-DEXTROSE 2-3 GM-% IV SOLR
2.0000 g | INTRAVENOUS | Status: AC
  Administered 2014-11-29: 2 g via INTRAVENOUS

## 2014-11-29 MED ORDER — PROPOFOL 10 MG/ML IV BOLUS
INTRAVENOUS | Status: AC
  Filled 2014-11-29: qty 20

## 2014-11-29 MED ORDER — METFORMIN HCL 500 MG PO TABS
500.0000 mg | ORAL_TABLET | Freq: Every day | ORAL | Status: DC
  Administered 2014-11-30: 500 mg via ORAL
  Filled 2014-11-29: qty 1

## 2014-11-29 MED ORDER — FENTANYL CITRATE (PF) 100 MCG/2ML IJ SOLN
INTRAMUSCULAR | Status: DC | PRN
  Administered 2014-11-29 (×4): 25 ug via INTRAVENOUS
  Administered 2014-11-29: 50 ug via INTRAVENOUS

## 2014-11-29 MED ORDER — BUPIVACAINE-EPINEPHRINE 0.5% -1:200000 IJ SOLN
INTRAMUSCULAR | Status: DC | PRN
  Administered 2014-11-29: 20 mL

## 2014-11-29 MED ORDER — ACETAMINOPHEN 325 MG PO TABS: 650.0000 mg | ORAL_TABLET | Freq: Four times a day (QID) | ORAL | Status: DC | PRN

## 2014-11-29 MED ORDER — PRAMIPEXOLE DIHYDROCHLORIDE 0.125 MG PO TABS
0.5000 mg | ORAL_TABLET | Freq: Two times a day (BID) | ORAL | Status: DC
  Administered 2014-11-29 – 2014-11-30 (×3): 0.5 mg via ORAL
  Filled 2014-11-29 (×3): qty 4

## 2014-11-29 MED ORDER — HYDROCHLOROTHIAZIDE 12.5 MG PO CAPS
12.5000 mg | ORAL_CAPSULE | Freq: Every day | ORAL | Status: DC
  Administered 2014-11-29 – 2014-11-30 (×2): 12.5 mg via ORAL
  Filled 2014-11-29 (×2): qty 1

## 2014-11-29 MED ORDER — TRANEXAMIC ACID 1000 MG/10ML IV SOLN
2000.0000 mg | Freq: Once | INTRAVENOUS | Status: DC
  Filled 2014-11-29 (×2): qty 20

## 2014-11-29 MED ORDER — ROCURONIUM BROMIDE 50 MG/5ML IV SOLN
INTRAVENOUS | Status: AC
  Filled 2014-11-29: qty 1

## 2014-11-29 MED ORDER — ZOLPIDEM TARTRATE 5 MG PO TABS
5.0000 mg | ORAL_TABLET | Freq: Every evening | ORAL | Status: DC | PRN
  Administered 2014-11-29: 5 mg via ORAL
  Filled 2014-11-29: qty 1

## 2014-11-29 MED ORDER — FENTANYL CITRATE (PF) 250 MCG/5ML IJ SOLN
INTRAMUSCULAR | Status: AC
  Filled 2014-11-29: qty 5

## 2014-11-29 MED ORDER — ONDANSETRON HCL 4 MG/2ML IJ SOLN: 4.0000 mg | Freq: Four times a day (QID) | INTRAMUSCULAR | Status: DC | PRN

## 2014-11-29 MED ORDER — PROPOFOL 10 MG/ML IV BOLUS
INTRAVENOUS | Status: DC | PRN
  Administered 2014-11-29: 120 mg via INTRAVENOUS

## 2014-11-29 MED ORDER — PHENOL 1.4 % MT LIQD: 1.0000 | OROMUCOSAL | Status: DC | PRN

## 2014-11-29 MED ORDER — PANTOPRAZOLE SODIUM 40 MG PO TBEC
40.0000 mg | DELAYED_RELEASE_TABLET | Freq: Every day | ORAL | Status: DC
  Administered 2014-11-30: 40 mg via ORAL
  Filled 2014-11-29: qty 1

## 2014-11-29 SURGICAL SUPPLY — 60 items
BANDAGE ESMARK 6X9 LF (GAUZE/BANDAGES/DRESSINGS) ×1 IMPLANT
BLADE SAGITTAL 13X1.27X60 (BLADE) ×2 IMPLANT
BLADE SAW SGTL 83.5X18.5 (BLADE) ×2 IMPLANT
BLADE SURG 10 STRL SS (BLADE) ×2 IMPLANT
BNDG CMPR 9X6 STRL LF SNTH (GAUZE/BANDAGES/DRESSINGS) ×1
BNDG ESMARK 6X9 LF (GAUZE/BANDAGES/DRESSINGS) ×2
BOWL SMART MIX CTS (DISPOSABLE) ×2 IMPLANT
CAPT KNEE TOTAL 3 ×2 IMPLANT
CEMENT BONE SIMPLEX SPEEDSET (Cement) ×4 IMPLANT
COVER SURGICAL LIGHT HANDLE (MISCELLANEOUS) ×2 IMPLANT
CUFF TOURNIQUET SINGLE 34IN LL (TOURNIQUET CUFF) ×2 IMPLANT
DRAPE EXTREMITY T 121X128X90 (DRAPE) ×2 IMPLANT
DRAPE IMP U-DRAPE 54X76 (DRAPES) ×2 IMPLANT
DRAPE INCISE IOBAN 66X45 STRL (DRAPES) ×4 IMPLANT
DRAPE PROXIMA HALF (DRAPES) IMPLANT
DRAPE U-SHAPE 47X51 STRL (DRAPES) ×2 IMPLANT
DRSG ADAPTIC 3X8 NADH LF (GAUZE/BANDAGES/DRESSINGS) ×2 IMPLANT
DRSG PAD ABDOMINAL 8X10 ST (GAUZE/BANDAGES/DRESSINGS) ×2 IMPLANT
DURAPREP 26ML APPLICATOR (WOUND CARE) ×4 IMPLANT
ELECT REM PT RETURN 9FT ADLT (ELECTROSURGICAL) ×2
ELECTRODE REM PT RTRN 9FT ADLT (ELECTROSURGICAL) ×1 IMPLANT
GAUZE SPONGE 4X4 12PLY STRL (GAUZE/BANDAGES/DRESSINGS) ×2 IMPLANT
GLOVE BIOGEL M 7.0 STRL (GLOVE) IMPLANT
GLOVE BIOGEL PI IND STRL 6.5 (GLOVE) IMPLANT
GLOVE BIOGEL PI IND STRL 7.5 (GLOVE) IMPLANT
GLOVE BIOGEL PI IND STRL 8.5 (GLOVE) ×2 IMPLANT
GLOVE BIOGEL PI INDICATOR 6.5 (GLOVE) ×1
GLOVE BIOGEL PI INDICATOR 7.5 (GLOVE)
GLOVE BIOGEL PI INDICATOR 8.5 (GLOVE) ×2
GLOVE SURG ORTHO 8.0 STRL STRW (GLOVE) ×4 IMPLANT
GLOVE SURG SS PI 6.5 STRL IVOR (GLOVE) ×1 IMPLANT
GOWN STRL REUS W/ TWL LRG LVL3 (GOWN DISPOSABLE) ×1 IMPLANT
GOWN STRL REUS W/ TWL XL LVL3 (GOWN DISPOSABLE) ×2 IMPLANT
GOWN STRL REUS W/TWL LRG LVL3 (GOWN DISPOSABLE) ×6
GOWN STRL REUS W/TWL XL LVL3 (GOWN DISPOSABLE) ×4
HANDPIECE INTERPULSE COAX TIP (DISPOSABLE) ×2
HOOD PEEL AWAY FACE SHEILD DIS (HOOD) ×6 IMPLANT
KIT BASIN OR (CUSTOM PROCEDURE TRAY) ×2 IMPLANT
KIT ROOM TURNOVER OR (KITS) ×2 IMPLANT
KNEE CAPITATED TOTAL 3 IMPLANT
MANIFOLD NEPTUNE II (INSTRUMENTS) ×2 IMPLANT
NEEDLE 22X1 1/2 (OR ONLY) (NEEDLE) ×4 IMPLANT
NS IRRIG 1000ML POUR BTL (IV SOLUTION) ×2 IMPLANT
PACK TOTAL JOINT (CUSTOM PROCEDURE TRAY) ×2 IMPLANT
PACK UNIVERSAL I (CUSTOM PROCEDURE TRAY) ×2 IMPLANT
PAD ARMBOARD 7.5X6 YLW CONV (MISCELLANEOUS) ×4 IMPLANT
PADDING CAST COTTON 6X4 STRL (CAST SUPPLIES) ×2 IMPLANT
SET HNDPC FAN SPRY TIP SCT (DISPOSABLE) ×1 IMPLANT
STAPLER VISISTAT 35W (STAPLE) ×2 IMPLANT
SUCTION FRAZIER TIP 10 FR DISP (SUCTIONS) ×2 IMPLANT
SUT BONE WAX W31G (SUTURE) ×2 IMPLANT
SUT VIC AB 0 CTB1 27 (SUTURE) ×4 IMPLANT
SUT VIC AB 1 CT1 27 (SUTURE) ×4
SUT VIC AB 1 CT1 27XBRD ANBCTR (SUTURE) ×2 IMPLANT
SUT VIC AB 2-0 CT1 27 (SUTURE) ×4
SUT VIC AB 2-0 CT1 TAPERPNT 27 (SUTURE) ×2 IMPLANT
SYR 20CC LL (SYRINGE) ×4 IMPLANT
TOWEL OR 17X24 6PK STRL BLUE (TOWEL DISPOSABLE) ×2 IMPLANT
TOWEL OR 17X26 10 PK STRL BLUE (TOWEL DISPOSABLE) ×2 IMPLANT
WATER STERILE IRR 1000ML POUR (IV SOLUTION) ×4 IMPLANT

## 2014-11-29 NOTE — Progress Notes (Signed)
Pt states had to have ultrasound of thyroid and will be seeing endocrinologist in 2 weeks.

## 2014-11-29 NOTE — Anesthesia Preprocedure Evaluation (Signed)
Anesthesia Evaluation  Patient identified by MRN, date of birth, ID band Patient awake    Reviewed: Allergy & Precautions, NPO status , Patient's Chart, lab work & pertinent test results  Airway Mallampati: II       Dental   Pulmonary    Pulmonary exam normal       Cardiovascular hypertension, + angina + CAD Normal cardiovascular exam+ dysrhythmias     Neuro/Psych Anxiety CVA    GI/Hepatic hiatal hernia, PUD, GERD-  ,  Endo/Other  diabetes, Type 2  Renal/GU Renal InsufficiencyRenal disease     Musculoskeletal  (+) Arthritis -,   Abdominal   Peds  Hematology  (+) anemia ,   Anesthesia Other Findings   Reproductive/Obstetrics                             Anesthesia Physical Anesthesia Plan  ASA: III  Anesthesia Plan: General   Post-op Pain Management:    Induction: Intravenous  Airway Management Planned: Oral ETT  Additional Equipment:   Intra-op Plan:   Post-operative Plan: Extubation in OR  Informed Consent: I have reviewed the patients History and Physical, chart, labs and discussed the procedure including the risks, benefits and alternatives for the proposed anesthesia with the patient or authorized representative who has indicated his/her understanding and acceptance.     Plan Discussed with: Anesthesiologist, CRNA and Surgeon  Anesthesia Plan Comments:         Anesthesia Quick Evaluation

## 2014-11-29 NOTE — Evaluation (Signed)
Physical Therapy Evaluation Patient Details Name: Heather [name] MRN: [id number] DOB: January 03, 1935 Today's Date: 11/29/2014   History of Present Illness  Pt is a 80 y/o F s/p R TKA.  Pt's PMH includes L TKA, neck pain, RLS, B cataracts, leg swelling, anemia, anxiety, DM, TIA in '08, a fib, and L foot stress fx.  Clinical Impression  Pt is s/p R TKA resulting in the deficits listed below (see PT Problem List). Pt demonstrated ability to ambulate 90 ft this session. Pt will have assist at home whenever necessary and has 2 steps to enter w/ no rails.  Pt will benefit from skilled PT to increase their independence and safety with mobility to allow discharge to the venue listed below.      Follow Up Recommendations Home health PT;Supervision for mobility/OOB    Equipment Recommendations  None recommended by PT    Recommendations for Other Services OT consult     Precautions / Restrictions Precautions Precautions: Knee;Fall Precaution Booklet Issued: Yes (comment) Precaution Comments: Reviewed no pillow under knee Restrictions Weight Bearing Restrictions: Yes LLE Weight Bearing: Weight bearing as tolerated      Mobility  Bed Mobility Overal bed mobility: Modified Independent             General bed mobility comments: Pt demonstrated leg hook technique w/o verbal cues, pt w/ previous L TKA.  Min use of bed rails.  Transfers Overall transfer level: Needs assistance Equipment used: Rolling walker (2 wheeled) Transfers: Sit to/from Stand Sit to Stand: Supervision         General transfer comment: Supervision for safety, min cues for hand placement.    Ambulation/Gait Ambulation/Gait assistance: Min guard Ambulation Distance (Feet): 90 Feet Assistive device: Rolling walker (2 wheeled) Gait Pattern/deviations: Decreased stride length;Decreased step length - left;Decreased weight shift to right;Decreased stance time - right;Antalgic;Step-to pattern;Step-through pattern;Trunk  flexed   Gait velocity interpretation: Below normal speed for age/gender General Gait Details: Dec step length LLE.  Inc WB through [place] to offload RLE.  Min trunk flexion.  Stairs            Wheelchair Mobility    Modified Rankin (Stroke Patients Only)       Balance Overall balance assessment: Needs assistance Sitting-balance support: No upper extremity supported;Feet supported Sitting balance-Leahy Scale: Fair     Standing balance support: Bilateral upper extremity supported;During functional activity Standing balance-Leahy Scale: Fair                               Pertinent Vitals/Pain Pain Assessment: 0-10 Pain Score: 4  Pain Location: R knee Pain Descriptors / Indicators: Aching Pain Intervention(s): Limited activity within patient's tolerance;Monitored during session;Repositioned    Home Living Family/patient expects to be discharged to:: Private residence Living Arrangements: Spouse/significant other Available Help at Discharge: Family;Available 24 hours/day (husband, son, daughter) Type of Home: House Home Access: Stairs to enter Entrance Stairs-Rails: None Entrance Stairs-Number of Steps: 2 Home Layout: One level Home Equipment: Walker - 4 wheels;Walker - 2 wheels;Cane - single point;Bedside commode;Tub bench      Prior Function Level of Independence: Independent               Hand Dominance   Dominant Hand: Right    Extremity/Trunk Assessment               Lower Extremity Assessment: RLE deficits/detail RLE Deficits / Details: weakness and limited ROM as expected s/p R  TKA       Communication   Communication: No difficulties  Cognition Arousal/Alertness: Awake/alert Behavior During Therapy: WFL for tasks assessed/performed Overall Cognitive Status: Within Functional Limits for tasks assessed                      General Comments      Exercises Total Joint Exercises Ankle Circles/Pumps: AROM;Both;10  reps;Supine Quad Sets: AROM;Both;10 reps;Supine Knee Flexion: AROM;Right;Seated;10 reps Goniometric ROM: 0-89      Assessment/Plan    PT Assessment Patient needs continued PT services  PT Diagnosis Difficulty walking;Abnormality of gait;Acute pain;Generalized weakness   PT Problem List Decreased strength;Decreased range of motion;Decreased activity tolerance;Decreased balance;Decreased mobility;Decreased coordination;Decreased knowledge of use of DME;Decreased safety awareness;Decreased knowledge of precautions;Decreased skin integrity;Pain;Impaired sensation  PT Treatment Interventions DME instruction;Gait training;Stair training;Therapeutic activities;Functional mobility training;Therapeutic exercise;Balance training;Neuromuscular re-education;Patient/family education;Modalities   PT Goals (Current goals can be found in the Care Plan section) Acute Rehab PT Goals Patient Stated Goal: to go home PT Goal Formulation: With patient/family Time For Goal Achievement: 12/06/14 Potential to Achieve Goals: Good    Frequency 7X/week   Barriers to discharge Inaccessible home environment 2 steps to enter home w/o railings    Co-evaluation               End of Session Equipment Utilized During Treatment: Gait belt Activity Tolerance: Patient tolerated treatment well Patient left: in chair;with call bell/phone within reach Nurse Communication: Mobility status;Precautions;Weight bearing status         Time: 9604-5409 PT Time Calculation (min) (ACUTE ONLY): 27 min   Charges:   PT Evaluation $Initial PT Evaluation Tier I: 1 Procedure PT Treatments $Gait Training: 8-22 mins   PT G CodesJoslyn Hy PT, DPT [phone] Pager: [phone] 11/29/2014, 3:28 PM

## 2014-11-29 NOTE — H&P (Signed)
Heather [name] MRN:  [id number] DOB/SEX:  Oct 29, 1934/female  CHIEF COMPLAINT:  Painful right Knee  HISTORY: Patient is a 80 y.o. female presented with a history of pain in the right knee. Onset of symptoms was gradual starting several years ago with gradually worsening course since that time. Prior procedures on the knee include arthroscopy. Patient has been treated conservatively with over-the-counter NSAIDs and activity modification. Patient currently rates pain in the knee at 10 out of 10 with activity. There is pain at night.  PAST MEDICAL HISTORY: Patient Active Problem List   Diagnosis Date Noted  . Neck pain 10/29/2014  . Insomnia 02/05/2014  . Medicare annual wellness visit, subsequent 11/22/2013  . RLS (restless legs syndrome) 10/04/2013  . UTI (lower urinary tract infection) 10/04/2013  . Cataracts, bilateral 04/02/2013  . Leg swelling 01/17/2012  . Hypokalemia 01/05/2012  . Anemia 01/05/2012  . Anxiety 01/05/2012  . Postoperative anemia due to acute blood loss 11/15/2011  . 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 Pre-syncope 10/13/2011  . DM (diabetes mellitus) 10/06/2011  . Pulmonary nodule 10/06/2011  . Abdominal pain, other specified site 10/06/2011  . DJD (degenerative joint disease) of knee 09/18/2011  . Hip pain 08/28/2011  . Renal insufficiency 08/28/2011  . CORONARY ATHEROSCLEROSIS NATIVE CORONARY ARTERY 03/07/2010  . Coronary atherosclerosis 01/18/2009  . FATIGUE 01/18/2009  . PERSISTENT DISORDER INITIATING/MAINTAINING SLEEP 09/09/2008  . DERMATITIS 10/16/2007  . PERIPHERAL EDEMA 10/16/2007  . PALPITATIONS 10/16/2007  . Hyperlipidemia 04/14/2007  . Essential hypertension 04/14/2007  . GERD 04/14/2007  . TRANSIENT ISCHEMIC ATTACK, HX OF 04/14/2007   Past Medical History  Diagnosis Date  . Coronary artery disease     post percutaneous coronary intervention in 2006  . Hyperlipidemia   . Restless legs     takes mirapex daily  . Fatigue   .  Persistent disorder of initiating or maintaining sleep   . Dermatitis   . S/P inguinal herniorrhaphy   . DJD (degenerative joint disease) of knee 09/18/2011    knees- both, elbow- R, ankles   . Hypertension     takes medications daily  . GERD (gastroesophageal reflux disease)     prilosec daily  . H/O dizziness     pre-syncope/due to dehydration  . Occasional tremors   . Diverticulitis     hx of  . Gastric ulcer     hx of  . Angina   . Atrial fibrillation   . Dysrhythmia, cardiac     "palpitations"  . Borderline diabetes   . H/O hiatal hernia   . Stroke 2000    TIA- series that have resolved-  . Hx of total knee replacement 11/12/11    left knee  . UTI (lower urinary tract infection) 10/04/2013  . Medicare annual wellness visit, subsequent 11/22/2013  . Hip pain 08/28/2011  . Diabetes mellitus without complication     no meds   Past Surgical History  Procedure Laterality Date  . Abdominal hysterectomy    . Knee arthroscopy      2 on left and 1 on right  . Foot surgery      left foot stress fracture repair  . Hernia repair      1989- esophageal hernia   . Upper gastrointestinal endoscopy  2013  . Total knee arthroplasty  11/12/2011    Procedure: TOTAL KNEE ARTHROPLASTY;  Surgeon: Lorn Junes, MD;  Location: [facility];  Service: Orthopedics;  Laterality: Left;  Dr Noemi Chapel wants 90 minutes for this case  .  Coronary angioplasty  03/2005; 06/2005    "1" plus "1"= "2 total"  . Eye surgery  11-16-13    cataract -right eye  . Brain surgery      related to stroke   2000  . Joint replacement       MEDICATIONS:   Prescriptions prior to admission  Medication Sig Dispense Refill Last Dose  . amLODipine (NORVASC) 10 MG tablet Take 0.5 tablets (5 mg total) by mouth daily. 45 tablet 1 11/29/2014 at Unknown time  . aspirin 81 MG tablet Take 81 mg by mouth daily.   Past Week at Unknown time  . Blood Glucose Monitoring Suppl (ONE TOUCH ULTRA SYSTEM KIT) W/DEVICE KIT 1 kit by Does not apply  route once. DX: 250.00 Check sugars daily as needed 1 each 0 Taking  . cholecalciferol (VITAMIN D) 1000 UNITS tablet Take 1,000 Units by mouth daily.   Past Week at Unknown time  . glucose blood (ONE TOUCH TEST STRIPS) test strip Check BS daily and prn DX: E11.9 100 each 11 Taking  . isosorbide mononitrate (IMDUR) 60 MG 24 hr tablet Take 0.5 tablets (30 mg total) by mouth daily. 45 tablet 2 11/29/2014 at Unknown time  . losartan-hydrochlorothiazide (HYZAAR) 100-25 MG per tablet TAKE ONE TABLET BY MOUTH ONCE DAILY 90 tablet 3 11/28/2014 at Unknown time  . magnesium oxide (MAG-OX) 400 MG tablet Take 400 mg by mouth 2 (two) times daily.    11/28/2014 at Unknown time  . metFORMIN (GLUCOPHAGE) 500 MG tablet Take 1 tablet (500 mg total) by mouth daily with breakfast. 90 tablet 1 11/28/2014 at Unknown time  . methocarbamol (ROBAXIN) 500 MG tablet Take 1 tablet (500 mg total) by mouth 2 (two) times daily as needed for muscle spasms. 40 tablet 0 Past Month at Unknown time  . metoprolol succinate (TOPROL-XL) 50 MG 24 hr tablet TAKE ONE TABLET BY MOUTH TWICE DAILY IMMEDIATELY  FOLOWING  A  MEAL 180 tablet 2 11/29/2014 at 0430  . omeprazole (PRILOSEC) 20 MG capsule Take 20 mg by mouth 2 (two) times daily before a meal.   11/29/2014 at Unknown time  . ONETOUCH DELICA LANCETS FINE MISC Check sugars daily and prn  DX 250.00 100 each 1 Taking  . potassium chloride SA (KLOR-CON M20) 20 MEQ tablet Take 1 tablet (20 mEq total) by mouth daily. 90 tablet 2 stopped  . pramipexole (MIRAPEX) 0.5 MG tablet Take 1 tablet (0.5 mg total) by mouth 2 (two) times daily. 60 tablet 3 11/28/2014 at Unknown time  . simvastatin (ZOCOR) 20 MG tablet TAKE ONE TABLET BY MOUTH AT BEDTIME 90 tablet 3 11/28/2014 at Unknown time  . methylPREDNISolone (MEDROL DOSEPAK) 4 MG TBPK tablet Use as directed on package instructions. (Patient not taking: Reported on 11/15/2014) 21 tablet 0 Not Taking at Unknown time    ALLERGIES:  No Known  Allergies  REVIEW OF SYSTEMS:  A comprehensive review of systems was negative.   FAMILY HISTORY:   Family History  Problem Relation Age of Onset  . Arthritis      family hx of  . Hypertension      family hx of  . Other      family hx of cardiovascular disorder  . Stroke Father     family hx of M 1st degree relative <50  . Coronary artery disease Mother   . Heart disease Mother   . Depression Brother   . Breast cancer Neg Hx   . Colon cancer Neg Hx   .  Anesthesia problems Neg Hx   . Hypotension Neg Hx   . Malignant hyperthermia Neg Hx   . Pseudochol deficiency Neg Hx   . Stroke Brother   . Diabetes Brother   . Cancer Brother     bladder with mets  . Diabetes Daughter     borderline  . Hypertension Daughter     SOCIAL HISTORY:   History  Substance Use Topics  . Smoking status: Never Smoker   . Smokeless tobacco: Never Used  . Alcohol Use: No     EXAMINATION:  Vital signs in last 24 hours: Temp:  [97.7 F (36.5 C)] 97.7 F (36.5 C) (05/23 0617) Pulse Rate:  [60] 60 (05/23 0617) Resp:  [20] 20 (05/23 0617) BP: (176)/(65) 176/65 mmHg (05/23 0617) SpO2:  [98 %] 98 % (05/23 0617) Weight:  [73.483 kg (162 lb)] 73.483 kg (162 lb) (05/23 0617)  General appearance: alert, cooperative and no distress Lungs: clear to auscultation bilaterally Heart: regular rate and rhythm, S1, S2 normal, no murmur, click, rub or gallop Abdomen: soft, non-tender; bowel sounds normal; no masses,  no organomegaly Extremities: extremities normal, atraumatic, no cyanosis or edema and Homans sign is negative, no sign of DVT Pulses: 2+ and symmetric Skin: Skin color, texture, turgor normal. No rashes or lesions Neurologic: Alert and oriented X 3, normal strength and tone. Normal symmetric reflexes. Normal coordination and gait  Musculoskeletal:  ROM 0-110, Ligaments intact,  Imaging Review Plain radiographs demonstrate severe degenerative joint disease of the right knee. The overall  alignment is significant varus. The bone quality appears to be good for age and reported activity level.  Assessment/Plan: Primary osteoarthritis, right knee   The patient history, physical examination and imaging studies are consistent with advanced degenerative joint disease of the right knee. The patient has failed conservative treatment.  The clearance notes were reviewed.  After discussion with the patient it was felt that Total Knee Replacement was indicated. The procedure,  risks, and benefits of total knee arthroplasty were presented and reviewed. The risks including but not limited to aseptic loosening, infection, blood clots, vascular injury, stiffness, patella tracking problems complications among others were discussed. The patient acknowledged the explanation, agreed to proceed with the plan.  Heather [name] 11/29/2014, 6:54 AM

## 2014-11-29 NOTE — Progress Notes (Signed)
CBG 217  

## 2014-11-29 NOTE — Addendum Note (Signed)
Addendum  created 11/29/14 1354 by Maryland Pink, CRNA   Modules edited: Anesthesia Attestations

## 2014-11-29 NOTE — Anesthesia Postprocedure Evaluation (Signed)
  Anesthesia Post-op Note  Patient: Heather [name]  Procedure(s) Performed: Procedure(s): RIGHT TOTAL KNEE ARTHROPLASTY (Right)  Patient Location: PACU  Anesthesia Type:GA combined with regional for post-op pain  Level of Consciousness: awake, alert , oriented and patient cooperative  Airway and Oxygen Therapy: Patient Spontanous Breathing  Post-op Pain: mild  Post-op Assessment: Post-op Vital signs reviewed, Patient's Cardiovascular Status Stable, Respiratory Function Stable, Patent Airway, No signs of Nausea or vomiting and Pain level controlled  Post-op Vital Signs: stable  Last Vitals:  Filed Vitals:   11/29/14 1017  BP: 122/47  Pulse: 58  Temp: 36.7 C  Resp: 16    Complications: No apparent anesthesia complications

## 2014-11-29 NOTE — Progress Notes (Signed)
Orthopedic Tech Progress Note Patient Details:  Heather [name] 10-05-1934 [id number] Viewed order from doctor's order list CPM Right Knee CPM Right Knee: On Right Knee Flexion (Degrees): 90 Right Knee Extension (Degrees): 0 Additional Comments: trapeze bar patient helper   Hildred Priest 11/29/2014, 9:31 AM

## 2014-11-29 NOTE — Transfer of Care (Signed)
Immediate Anesthesia Transfer of Care Note  Patient: Heather [name]  Procedure(s) Performed: Procedure(s): RIGHT TOTAL KNEE ARTHROPLASTY (Right)  Patient Location: PACU  Anesthesia Type:GA combined with regional for post-op pain  Level of Consciousness: sedated  Airway & Oxygen Therapy: Patient Spontanous Breathing and Patient connected to nasal cannula oxygen  Post-op Assessment: Report given to RN and Post -op Vital signs reviewed and stable  Post vital signs: Reviewed and stable  Last Vitals:  Filed Vitals:   11/29/14 0911  BP: 140/64  Pulse: 71  Temp: 36.4 C  Resp: 13    Complications: No apparent anesthesia complications

## 2014-11-29 NOTE — Anesthesia Procedure Notes (Addendum)
Procedure Name: LMA Insertion Date/Time: 11/29/2014 7:43 AM Performed by: Maryland Pink Pre-anesthesia Checklist: Patient identified, Emergency Drugs available, Suction available and Patient being monitored Patient Re-evaluated:Patient Re-evaluated prior to inductionOxygen Delivery Method: Circle system utilized Preoxygenation: Pre-oxygenation with 100% oxygen Intubation Type: IV induction LMA: LMA inserted LMA Size: 4.0 Number of attempts: 1 Placement Confirmation: positive ETCO2 and breath sounds checked- equal and bilateral Tube secured with: Tape Dental Injury: Teeth and Oropharynx as per pre-operative assessment    Anesthesia Regional Block:  Adductor canal block  Pre-Anesthetic Checklist: ,, timeout performed, Correct Patient, Correct Site, Correct Laterality, Correct Procedure, Correct Position, site marked, Risks and benefits discussed,  Surgical consent,  Pre-op evaluation,  At surgeon's request and post-op pain management  Laterality: Right  Prep: chloraprep and alcohol swabs       Needles:  Injection technique: Single-shot  Needle Type: Stimulator Needle - 80          Additional Needles:  Procedures: Doppler guided Adductor canal block Narrative:  Start time: 11/29/2014 7:04 AM End time: 11/29/2014 7:14 AM Injection made incrementally with aspirations every 5 mL.  Performed by: Personally  Anesthesiologist: Kate Sable  Additional Notes: Pt accepts procedure w/ risks. 20cc 0.5%Marcaine w/ epi w/o difficulty or discomfort. GES

## 2014-11-30 ENCOUNTER — Encounter (HOSPITAL_COMMUNITY): Payer: Self-pay | Admitting: Orthopedic Surgery

## 2014-11-30 LAB — CBC
HEMATOCRIT: 34.7 % — AB (ref 36.0–46.0)
HEMOGLOBIN: 11.4 g/dL — AB (ref 12.0–15.0)
MCH: 29.8 pg (ref 26.0–34.0)
MCHC: 32.9 g/dL (ref 30.0–36.0)
MCV: 90.8 fL (ref 78.0–100.0)
PLATELETS: 128 10*3/uL — AB (ref 150–400)
RBC: 3.82 MIL/uL — AB (ref 3.87–5.11)
RDW: 13.3 % (ref 11.5–15.5)
WBC: 11.3 10*3/uL — AB (ref 4.0–10.5)

## 2014-11-30 LAB — GLUCOSE, CAPILLARY
GLUCOSE-CAPILLARY: 125 mg/dL — AB (ref 65–99)
GLUCOSE-CAPILLARY: 164 mg/dL — AB (ref 65–99)

## 2014-11-30 LAB — BASIC METABOLIC PANEL
Anion gap: 8 (ref 5–15)
BUN: 22 mg/dL — ABNORMAL HIGH (ref 6–20)
CO2: 26 mmol/L (ref 22–32)
Calcium: 8.5 mg/dL — ABNORMAL LOW (ref 8.9–10.3)
Chloride: 105 mmol/L (ref 101–111)
Creatinine, Ser: 0.84 mg/dL (ref 0.44–1.00)
GFR calc Af Amer: >60 mL/min (ref >60)
GFR calc non Af Amer: >60 mL/min (ref >60)
Glucose, Bld: 136 mg/dL — ABNORMAL HIGH (ref 65–99)
Potassium: 3.8 mmol/L (ref 3.5–5.1)
Sodium: 139 mmol/L (ref 135–145)

## 2014-11-30 LAB — HEMOGLOBIN A1C
Hgb A1c MFr Bld: 7 % — ABNORMAL HIGH (ref 4.8–5.6)
MEAN PLASMA GLUCOSE: 154 mg/dL

## 2014-11-30 MED ORDER — OXYCODONE HCL 10 MG PO TABS: 10.0000 mg | ORAL_TABLET | Freq: Two times a day (BID) | ORAL | Status: DC

## 2014-11-30 MED ORDER — ENOXAPARIN SODIUM 40 MG/0.4ML ~~LOC~~ SOLN
40.0000 mg | SUBCUTANEOUS | Status: DC
Start: 2014-11-30 — End: 2014-12-22

## 2014-11-30 MED ORDER — METHOCARBAMOL 500 MG PO TABS: 500.0000 mg | ORAL_TABLET | Freq: Four times a day (QID) | ORAL | Status: DC | PRN

## 2014-11-30 MED ORDER — OXYCODONE HCL 5 MG PO TABS: 5.0000 mg | ORAL_TABLET | ORAL | Status: DC | PRN

## 2014-11-30 NOTE — Progress Notes (Signed)
Physical Therapy Treatment Patient Details Name: Heather [name] MRN: [id number] DOB: 09-22-1934 Today's Date: 11/30/2014    History of Present Illness Pt is a 80 y/o F s/p R TKA.  Pt's PMH includes L TKA, neck pain, RLS, B cataracts, leg swelling, anemia, anxiety, DM, TIA in '08, a fib, and L foot stress fx.    PT Comments    Patient progressing very well this morning with overall mobility and HEP. Will plan to practice her one step in the second session then patient should be ready for DC home.   Follow Up Recommendations  Home health PT;Supervision for mobility/OOB     Equipment Recommendations  None recommended by PT    Recommendations for Other Services       Precautions / Restrictions Precautions Precautions: Knee;Fall Precaution Comments: Reviewed no pillow under knee Restrictions Weight Bearing Restrictions: Yes RLE Weight Bearing: Weight bearing as tolerated LLE Weight Bearing: Weight bearing as tolerated    Mobility  Bed Mobility Overal bed mobility: Modified Independent                Transfers Overall transfer level: Modified independent                  Ambulation/Gait Ambulation/Gait assistance: Supervision Ambulation Distance (Feet): 300 Feet Assistive device: Rolling walker (2 wheeled) Gait Pattern/deviations: Step-through pattern;Decreased stride length   Gait velocity interpretation: Below normal speed for age/gender General Gait Details: Patient with safe use of RW and step through pattern   Stairs            Wheelchair Mobility    Modified Rankin (Stroke Patients Only)       Balance                                    Cognition Arousal/Alertness: Awake/alert Behavior During Therapy: WFL for tasks assessed/performed Overall Cognitive Status: Within Functional Limits for tasks assessed                      Exercises Total Joint Exercises Quad Sets: AROM;Both;10 reps;Supine Heel Slides:  AROM;Right;10 reps Straight Leg Raises: AROM;Right;10 reps Long Arc Quad: AROM;Right;10 reps    General Comments        Pertinent Vitals/Pain Pain Assessment: 0-10 Pain Score: 2  Pain Location: R knee Pain Descriptors / Indicators: Sore Pain Intervention(s): Monitored during session;Repositioned    Home Living Family/patient expects to be discharged to:: Private residence Living Arrangements: Spouse/significant other Available Help at Discharge: Family;Available 24 hours/day (husband, son, and daughter) Type of Home: House Home Access: Stairs to enter Entrance Stairs-Rails: None Home Layout: One level Home Equipment: Environmental consultant - 4 wheels;Walker - 2 wheels;Cane - single point;Bedside commode;Tub bench      Prior Function Level of Independence: Independent          PT Goals (current goals can now be found in the care plan section) Progress towards PT goals: Progressing toward goals    Frequency  7X/week    PT Plan Current plan remains appropriate    Co-evaluation             End of Session Equipment Utilized During Treatment: Gait belt Activity Tolerance: Patient tolerated treatment well Patient left: in chair;with call bell/phone within reach     Time: 0816-0845 PT Time Calculation (min) (ACUTE ONLY): 29 min  Charges:  $Gait Training: 8-22 mins $Therapeutic Exercise: 8-22 mins  G Codes:      Jacqualyn Posey 11/30/2014, 10:33 AM 11/30/2014 Jacqualyn Posey PTA [phone] pager [phone] office

## 2014-11-30 NOTE — Discharge Instructions (Signed)
INSTRUCTIONS AFTER JOINT REPLACEMENT   o Remove items at home which could result in a fall. This includes throw rugs or furniture in walking pathways o ICE to the affected joint every three hours while awake for 30 minutes at a time, for at least the first 3-5 days, and then as needed for pain and swelling.  Continue to use ice for pain and swelling. You may notice swelling that will progress down to the foot and ankle.  This is normal after surgery.  Elevate your leg when you are not up walking on it.   o Continue to use the breathing machine you got in the hospital (incentive spirometer) which will help keep your temperature down.  It is common for your temperature to cycle up and down following surgery, especially at night when you are not up moving around and exerting yourself.  The breathing machine keeps your lungs expanded and your temperature down.   DIET:  As you were doing prior to hospitalization, we recommend a well-balanced diet.  DRESSING / WOUND CARE / SHOWERING  May change dressing on Wednesday  ACTIVITY  o Increase activity slowly as tolerated, but follow the weight bearing instructions below.   o No driving for 6 weeks or until further direction given by your physician.  You cannot drive while taking narcotics.  o No lifting or carrying greater than 10 lbs. until further directed by your surgeon. o Avoid periods of inactivity such as sitting longer than an hour when not asleep. This helps prevent blood clots.  o You may return to work once you are authorized by your doctor.     WEIGHT BEARING   Weight bearing as tolerated with assist device (walker, cane, etc) as directed, use it as long as suggested by your surgeon or therapist, typically at least 4-6 weeks.   EXERCISES  Results after joint replacement surgery are often greatly improved when you follow the exercise, range of motion and muscle strengthening exercises prescribed by your doctor. Safety measures are also  important to protect the joint from further injury. Any time any of these exercises cause you to have increased pain or swelling, decrease what you are doing until you are comfortable again and then slowly increase them. If you have problems or questions, call your caregiver or physical therapist for advice.   Rehabilitation is important following a joint replacement. After just a few days of immobilization, the muscles of the leg can become weakened and shrink (atrophy).  These exercises are designed to build up the tone and strength of the thigh and leg muscles and to improve motion. Often times heat used for twenty to thirty minutes before working out will loosen up your tissues and help with improving the range of motion but do not use heat for the first two weeks following surgery (sometimes heat can increase post-operative swelling).   These exercises can be done on a training (exercise) mat, on the floor, on a table or on a bed. Use whatever works the best and is most comfortable for you.    Use music or television while you are exercising so that the exercises are a pleasant break in your day. This will make your life better with the exercises acting as a break in your routine that you can look forward to.   Perform all exercises about fifteen times, three times per day or as directed.  You should exercise both the operative leg and the other leg as well.  Exercises include:  Quad Sets - Tighten up the muscle on the front of the thigh (Quad) and hold for 5-10 seconds.   °• Straight Leg Raises - With your knee straight (if you were given a brace, keep it on), lift the leg to 60 degrees, hold for 3 seconds, and slowly lower the leg.  Perform this exercise against resistance later as your leg gets stronger.  °• Leg Slides: Lying on your back, slowly slide your foot toward your buttocks, bending your knee up off the floor (only go as far as is comfortable). Then slowly slide your foot back down until  your leg is flat on the floor again.  °• Angel Wings: Lying on your back spread your legs to the side as far apart as you can without causing discomfort.  °• Hamstring Strength:  Lying on your back, push your heel against the floor with your leg straight by tightening up the muscles of your buttocks.  Repeat, but this time bend your knee to a comfortable angle, and push your heel against the floor.  You may put a pillow under the heel to make it more comfortable if necessary.  ° °A rehabilitation program following joint replacement surgery can speed recovery and prevent re-injury in the future due to weakened muscles. Contact your doctor or a physical therapist for more information on knee rehabilitation.  ° ° °CONSTIPATION ° °Constipation is defined medically as fewer than three stools per week and severe constipation as less than one stool per week.  Even if you have a regular bowel pattern at home, your normal regimen is likely to be disrupted due to multiple reasons following surgery.  Combination of anesthesia, postoperative narcotics, change in appetite and fluid intake all can affect your bowels.  ° °YOU MUST use at least one of the following options; they are listed in order of increasing strength to get the job done.  They are all available over the counter, and you may need to use some, POSSIBLY even all of these options:   ° °Drink plenty of fluids (prune juice may be helpful) and high fiber foods °Colace 100 mg by mouth twice a day  °Senokot for constipation as directed and as needed Dulcolax (bisacodyl), take with full glass of water  °Miralax (polyethylene glycol) once or twice a day as needed. ° °If you have tried all these things and are unable to have a bowel movement in the first 3-4 days after surgery call either your surgeon or your primary doctor.   ° °If you experience loose stools or diarrhea, hold the medications until you stool forms back up.  If your symptoms do not get better within 1 week  or if they get worse, check with your doctor.  If you experience "the worst abdominal pain ever" or develop nausea or vomiting, please contact the office immediately for further recommendations for treatment. ° ° °ITCHING:  If you experience itching with your medications, try taking only a single pain pill, or even half a pain pill at a time.  You can also use Benadryl over the counter for itching or also to help with sleep.  ° °TED HOSE STOCKINGS:  Use stockings on both legs until for at least 2 weeks or as directed by physician office. They may be removed at night for sleeping. ° °MEDICATIONS:  See your medication summary on the “After Visit Summary” that nursing will review with you.  You may have some home medications which will be placed on hold until   you complete the course of blood thinner medication.  It is important for you to complete the blood thinner medication as prescribed. ° °PRECAUTIONS:  If you experience chest pain or shortness of breath - call 911 immediately for transfer to the hospital emergency department.  ° °If you develop a fever greater that 101 F, purulent drainage from wound, increased redness or drainage from wound, foul odor from the wound/dressing, or calf pain - CONTACT YOUR SURGEON.   °                                                °FOLLOW-UP APPOINTMENTS:  If you do not already have a post-op appointment, please call the office for an appointment to be seen by your surgeon.  Guidelines for how soon to be seen are listed in your “After Visit Summary”, but are typically between 1-4 weeks after surgery. ° °OTHER INSTRUCTIONS:  ° °Knee Replacement:  Do not place pillow under knee, focus on keeping the knee straight while resting. CPM instructions: 0-90 degrees, 2 hours in the morning, 2 hours in the afternoon, and 2 hours in the evening. Place foam block, curve side up under heel at all times except when in CPM or when walking.  DO NOT modify, tear, cut, or change the foam block in any  way. ° °MAKE SURE YOU:  °• Understand these instructions.  °• Get help right away if you are not doing well or get worse.  ° ° °Thank you for letting us be a part of your medical care team.  It is a privilege we respect greatly.  We hope these instructions will help you stay on track for a fast and full recovery!  ° °

## 2014-11-30 NOTE — Progress Notes (Signed)
SPORTS MEDICINE AND JOINT REPLACEMENT  Lara Mulch, MD   Carlynn Spry, PA-C [place] [address], [place], [place]  [address]                             [phone]   PROGRESS NOTE  Subjective:  negative for Chest Pain  negative for Shortness of Breath  negative for Nausea/Vomiting   negative for Calf Pain  negative for Bowel Movement   Tolerating Diet: yes         Patient reports pain as 4 on 0-10 scale.    Objective: Vital signs in last 24 hours:   Patient Vitals for the past 24 hrs:  BP Temp Temp src Pulse SpO2  11/30/14 0623 140/65 mmHg 97.7 F (36.5 C) - 71 98 %  11/30/14 0212 130/85 mmHg 98.3 F (36.8 C) Oral 63 98 %  11/29/14 2009 (!) 133/51 mmHg 97.9 F (36.6 C) Oral 67 95 %    @flow {1959:LAST@   Intake/Output from previous day:   05/23 0701 - 05/24 0700 In: 1250 [I.V.:1200] Out: 150    Intake/Output this shift:   05/24 0701 - 05/24 1900 In: 770 [I.V.:770] Out: -    Intake/Output      05/23 0701 - 05/24 0700 05/24 0701 - 05/25 0700   I.V. (mL/kg) 1200 (16.3) 770 (10.5)   IV Piggyback 50    Total Intake(mL/kg) 1250 (17) 770 (10.5)   Urine (mL/kg/hr) 0 (0)    Blood 150 (0.1)    Total Output 150     Net +1100 +770        Urine Occurrence 4 x 1 x      LABORATORY DATA:  Recent Labs  11/29/14 1640 11/30/14 0532  WBC 9.3 11.3*  HGB 12.4 11.4*  HCT 38.3 34.7*  PLT 157 128*    Recent Labs  11/29/14 1640 11/30/14 0532  NA  --  139  K  --  3.8  CL  --  105  CO2  --  26  BUN  --  22*  CREATININE 0.95 0.84  GLUCOSE  --  136*  CALCIUM  --  8.5*   Lab Results  Component Value Date   INR 0.98 11/16/2014   INR 0.91 11/06/2011   INR 0.94 09/24/2011    Examination:  General appearance: alert, cooperative and no distress Extremities: extremities normal, atraumatic, no cyanosis or edema and Homans sign is negative, no sign of DVT  Wound Exam: clean, dry, intact   Drainage:  None: wound tissue dry  Motor Exam: EHL and FHL  Intact  Sensory Exam: Deep Peroneal normal   Assessment:    1 Day Post-Op  Procedure(s) (LRB): RIGHT TOTAL KNEE ARTHROPLASTY (Right)  ADDITIONAL DIAGNOSIS:  Active Problems:   S/P total knee arthroplasty  Acute Blood Loss Anemia   Plan: Physical Therapy as ordered Weight Bearing as Tolerated (WBAT)  DVT Prophylaxis:  Lovenox  DISCHARGE PLAN: Home  DISCHARGE NEEDS: HHPT, CPM, Walker and 3-in-1 comode seat         Malashia Kamaka 11/30/2014, 11:58 AM

## 2014-11-30 NOTE — Progress Notes (Signed)
CBG 125 RN notified 

## 2014-11-30 NOTE — Op Note (Signed)
TOTAL KNEE REPLACEMENT OPERATIVE NOTE:  11/29/2014  12:30 PM  PATIENT:  Heather [name]  80 y.o. female  PRE-OPERATIVE DIAGNOSIS:  PRIMARY OSTEOARTHRITIS RIGHT KNEE  POST-OPERATIVE DIAGNOSIS:  PRIMARY OSTEOARTHRITIS RIGHT KNEE  PROCEDURE:  Procedure(s): RIGHT TOTAL KNEE ARTHROPLASTY  SURGEON:  Surgeon(s): Vickey Huger, MD  PHYSICIAN ASSISTANT: Carlynn Spry, [facility]  ANESTHESIA:   general  DRAINS: Hemovac  SPECIMEN: None  COUNTS:  Correct  TOURNIQUET:   Total Tourniquet Time Documented: Thigh (Right) - 42 minutes Total: Thigh (Right) - 42 minutes   DICTATION:  Indication for procedure:    The patient is a 80 y.o. female who has failed conservative treatment for PRIMARY OSTEOARTHRITIS RIGHT KNEE.  Informed consent was obtained prior to anesthesia. The risks versus benefits of the operation were explain and in a way the patient can, and did, understand.   On the implant demand matching protocol, this patient scored 10.  Therefore, this patient did" "did not receive a polyethylene insert with vitamin E which is a high demand implant.  Description of procedure:     The patient was taken to the operating room and placed under anesthesia.  The patient was positioned in the usual fashion taking care that all body parts were adequately padded and/or protected.  I foley catheter was not placed.  A tourniquet was applied and the leg prepped and draped in the usual sterile fashion.  The extremity was exsanguinated with the esmarch and tourniquet inflated to 350 mmHg.  Pre-operative range of motion was normal.  The knee was in 5 degree of mild valgus.  A midline incision approximately 6-7 inches long was made with a #10 blade.  A new blade was used to make a parapatellar arthrotomy going 2-3 cm into the quadriceps tendon, over the patella, and alongside the medial aspect of the patellar tendon.  A synovectomy was then performed with the #10 blade and forceps. I then elevated the deep MCL  off the medial tibial metaphysis subperiosteally around to the semimembranosus attachment.    I everted the patella and used calipers to measure patellar thickness.  I used the reamer to ream down to appropriate thickness to recreate the native thickness.  I then removed excess bone with the rongeur and sagittal saw.  I used the appropriately sized template and drilled the three lug holes.  I then put the trial in place and measured the thickness with the calipers to ensure recreation of the native thickness.  The trial was then removed and the patella subluxed and the knee brought into flexion.  A homan retractor was place to retract and protect the patella and lateral structures.  A Z-retractor was place medially to protect the medial structures.  The extra-medullary alignment system was used to make cut the tibial articular surface perpendicular to the anamotic axis of the tibia and in 3 degrees of posterior slope.  The cut surface and alignment jig was removed.  I then used the intramedullary alignment guide to make a 4 valgus cut on the distal femur.  I then marked out the epicondylar axis on the distal femur.  The posterior condylar axis measured 5 degrees.  I then used the anterior referencing sizer and measured the femur to be a size 6.  The 4-In-1 cutting block was screwed into place in external rotation matching the posterior condylar angle, making our cuts perpendicular to the epicondylar axis.  Anterior, posterior and chamfer cuts were made with the sagittal saw.  The cutting block and cut  pieces were removed.  A lamina spreader was placed in 90 degrees of flexion.  The ACL, PCL, menisci, and posterior condylar osteophytes were removed.  A 10 mm spacer blocked was found to offer good flexion and extension gap balance after minimal in degree releasing.   The scoop retractor was then placed and the femoral finishing block was pinned in place.  The small sagittal saw was used as well as the lug  drill to finish the femur.  The block and cut surfaces were removed and the medullary canal hole filled with autograft bone from the cut pieces.  The tibia was delivered forward in deep flexion and external rotation.  A size E tray was selected and pinned into place centered on the medial 1/3 of the tibial tubercle.  The reamer and keel was used to prepare the tibia through the tray.    I then trialed with the size 6 femur, size E tibia, a 10 mm insert and the 32 patella.  I had excellent flexion/extension gap balance, excellent patella tracking.  Flexion was full and beyond 120 degrees; extension was zero.  These components were chosen and the staff opened them to me on the back table while the knee was lavaged copiously and the cement mixed.  The soft tissue was infiltrated with 60cc of exparel 1.3% through a 21 gauge needle.  I cemented in the components and removed all excess cement.  The polyethylene tibial component was snapped into place and the knee placed in extension while cement was hardening.  The capsule was infilltrated with 30cc of .25% Marcaine with epinephrine.  A hemovac was place in the joint exiting superolaterally.  A pain pump was place superomedially superficial to the arthrotomy.  Once the cement was hard, the tourniquet was let down.  Hemostasis was obtained.  The arthrotomy was closed with figure-8 #1 vicryl sutures.  The deep soft tissues were closed with #0 vicryls and the subcuticular layer closed with a running #2-0 vicryl.  The skin was reapproximated and closed with skin staples.  The wound was dressed with xeroform, 4 x4's, 2 ABD sponges, a single layer of webril and a TED stocking.   The patient was then awakened, extubated, and taken to the recovery room in stable condition.  BLOOD LOSS:  300cc DRAINS: 1 hemovac, 1 pain catheter COMPLICATIONS:  None.  PLAN OF CARE: Admit to inpatient   PATIENT DISPOSITION:  PACU - hemodynamically stable.   Delay start of  Pharmacological VTE agent (>24hrs) due to surgical blood loss or risk of bleeding:  not applicable  Please fax a copy of this op note to my office at [phone] (please only include page 1 and 2 of the Case Information op note)

## 2014-11-30 NOTE — Progress Notes (Signed)
Occupational Therapy Evaluation Patient Details Name: Heather [name] MRN: [id number] DOB: [birth year] Today's Date: 11/30/2014    History of Present Illness Pt is a 80 y/o F s/p R TKA.  Pt's PMH includes L TKA, neck pain, RLS, B cataracts, leg swelling, anemia, anxiety, DM, TIA in '08, a fib, and L foot stress fx.   Clinical Impression   Patient admitted with above. Patient independent PTA. Patient currently functioning at an overall mod I level. D/C from acute OT services and no additional follow-up OT needs at this time. All appropriate education provided to patient. Please re-order OT if needed.      Follow Up Recommendations  No OT follow up;Supervision - Intermittent    Equipment Recommendations  None recommended by OT    Recommendations for Other Services  None at this time   Precautions / Restrictions Precautions Precautions: Knee;Fall Precaution Comments: Reviewed no pillow under knee Restrictions Weight Bearing Restrictions: Yes RLE Weight Bearing: Weight bearing as tolerated LLE Weight Bearing: Weight bearing as tolerated      Mobility Bed Mobility Overal bed mobility: Modified Independent  Transfers Overall transfer level: Modified independent   Balance Overall balance assessment: No apparent balance deficits (not formally assessed)    ADL Overall ADL's : Modified independent General ADL Comments: Pt safe with ADLs and transfers using RW.      Pertinent Vitals/Pain Pain Assessment: 0-10 Pain Score: 2  Pain Location: R knee Pain Descriptors / Indicators: Sore Pain Intervention(s): Monitored during session;Repositioned     Hand Dominance Right   Extremity/Trunk Assessment Upper Extremity Assessment Upper Extremity Assessment: Overall WFL for tasks assessed   Lower Extremity Assessment Lower Extremity Assessment: Defer to PT evaluation   Cervical / Trunk Assessment Cervical / Trunk Assessment: Normal   Communication Communication Communication:  No difficulties   Cognition Arousal/Alertness: Awake/alert Behavior During Therapy: WFL for tasks assessed/performed Overall Cognitive Status: Within Functional Limits for tasks assessed              Home Living Family/patient expects to be discharged to:: Private residence Living Arrangements: Spouse/significant other Available Help at Discharge: Family;Available 24 hours/day (husband, son, and daughter) Type of Home: House Home Access: Stairs to enter [employer] of Steps: 2 Entrance Stairs-Rails: None Home Layout: One level     Bathroom Shower/Tub: Tub/shower unit;Curtain   Bathroom Toilet: Handicapped height     Home Equipment: Environmental consultant - 4 wheels;Walker - 2 wheels;Cane - single point;Bedside commode;Tub bench    Prior Functioning/Environment Level of Independence: Independent      OT Diagnosis: Generalized weakness;Acute pain   OT Problem List:  n/a, no further acute OT needs identified    OT Treatment/Interventions:   n/a, no further acute OT needs identified    OT Goals(Current goals can be found in the care plan section) Acute Rehab OT Goals Patient Stated Goal: to go home  OT Frequency:   n/a, no further acute OT needs identified    Barriers to D/C:  None known at this time   End of Session Equipment Utilized During Treatment: Rolling walker CPM Right Knee CPM Right Knee: Off  Activity Tolerance: Patient tolerated treatment well Patient left: in bed;with call bell/phone within reach (seated EOB)   Time: 6440-3474 OT Time Calculation (min): 35 min Charges:  OT General Charges $OT Visit: 1 Procedure OT Evaluation $Initial OT Evaluation Tier I: 1 Procedure OT Treatments $Self Care/Home Management : 8-22 mins  Carlean Crowl , MS, OTR/L, CLT Pager: [phone]  11/30/2014, 10:49 AM

## 2014-11-30 NOTE — Progress Notes (Signed)
Physical Therapy Treatment Patient Details Name: Heather [name] MRN: [id number] DOB: 04-20-1935 Today's Date: 11/30/2014    History of Present Illness Pt is a 80 y/o F s/p R TKA.  Pt's PMH includes L TKA, neck pain, RLS, B cataracts, leg swelling, anemia, anxiety, DM, TIA in '08, a fib, and L foot stress fx.    PT Comments    Patient continues to make great progress this afternoon and able to complete stair training. Patient having some increased pain this afternoon but she is eager to return home. Patient safe to D/C from a mobility standpoint based on progression towards goals set on PT eval.    Follow Up Recommendations  Home health PT;Supervision for mobility/OOB     Equipment Recommendations  None recommended by PT    Recommendations for Other Services       Precautions / Restrictions Precautions Precautions: Knee;Fall Precaution Comments: Reviewed no pillow under knee Restrictions RLE Weight Bearing: Weight bearing as tolerated    Mobility  Bed Mobility Overal bed mobility: Modified Independent                Transfers Overall transfer level: Modified independent                  Ambulation/Gait Ambulation/Gait assistance: Supervision Ambulation Distance (Feet): 600 Feet Assistive device: Rolling walker (2 wheeled) Gait Pattern/deviations: Step-through pattern;Decreased stride length   Gait velocity interpretation: Below normal speed for age/gender General Gait Details: Patient with safe use of RW and step through pattern although more antalgic this session.    Stairs Stairs: Yes Stairs assistance: Min guard Stair Management: Step to pattern;Forwards;With walker Number of Stairs: 1 General stair comments: Patient practiced one step x2 with cues for sequence and technique  Wheelchair Mobility    Modified Rankin (Stroke Patients Only)       Balance                                    Cognition Arousal/Alertness:  Awake/alert Behavior During Therapy: WFL for tasks assessed/performed Overall Cognitive Status: Within Functional Limits for tasks assessed                      Exercises Total Joint Exercises Quad Sets: AROM;Both;10 reps;Supine Heel Slides: AROM;Right;10 reps Straight Leg Raises: AROM;Right;10 reps Long Arc Quad: AROM;Right;10 reps    General Comments        Pertinent Vitals/Pain Pain Score: 5  Pain Location: r knee Pain Descriptors / Indicators: Aching;Sore Pain Intervention(s): Monitored during session;Repositioned    Home Living                      Prior Function            PT Goals (current goals can now be found in the care plan section) Progress towards PT goals: Progressing toward goals    Frequency  7X/week    PT Plan Current plan remains appropriate    Co-evaluation             End of Session Equipment Utilized During Treatment: Gait belt Activity Tolerance: Patient tolerated treatment well Patient left: in chair;with call bell/phone within reach     Time: 1352-1421 PT Time Calculation (min) (ACUTE ONLY): 29 min  Charges:  $Gait Training: 8-22 mins $Therapeutic Exercise: 8-22 mins  G Codes:      Jacqualyn Posey 11/30/2014, 2:51 PM 11/30/2014 Jacqualyn Posey PTA [phone] pager [phone] office

## 2014-11-30 NOTE — Progress Notes (Signed)
Discharge instructions gave to pt and her husband,all questions answered. And pt is waiting for her daughter to pick her home.

## 2014-12-01 ENCOUNTER — Telehealth: Payer: Self-pay | Admitting: *Deleted

## 2014-12-01 DIAGNOSIS — E119 Type 2 diabetes mellitus without complications: Secondary | ICD-10-CM | POA: Diagnosis not present

## 2014-12-01 DIAGNOSIS — Z471 Aftercare following joint replacement surgery: Secondary | ICD-10-CM | POA: Diagnosis not present

## 2014-12-01 DIAGNOSIS — I10 Essential (primary) hypertension: Secondary | ICD-10-CM | POA: Diagnosis not present

## 2014-12-01 DIAGNOSIS — I4891 Unspecified atrial fibrillation: Secondary | ICD-10-CM | POA: Diagnosis not present

## 2014-12-01 DIAGNOSIS — M159 Polyosteoarthritis, unspecified: Secondary | ICD-10-CM | POA: Diagnosis not present

## 2014-12-01 DIAGNOSIS — I2510 Atherosclerotic heart disease of native coronary artery without angina pectoris: Secondary | ICD-10-CM | POA: Diagnosis not present

## 2014-12-01 NOTE — Telephone Encounter (Signed)
Spoke with husband to follow-up on hospitalization.  Husband states she is still in some pain (nerve block wore off), but she is taking Oxycodone and he has another prescription to pick up today.  He states that PT will be visiting her later this afternoon.  They are already scheduled to follow-up with the surgeon and would not like PCP follow-up at this time due to immobility of patient.

## 2014-12-02 DIAGNOSIS — Z471 Aftercare following joint replacement surgery: Secondary | ICD-10-CM | POA: Diagnosis not present

## 2014-12-02 DIAGNOSIS — E119 Type 2 diabetes mellitus without complications: Secondary | ICD-10-CM | POA: Diagnosis not present

## 2014-12-02 DIAGNOSIS — I10 Essential (primary) hypertension: Secondary | ICD-10-CM | POA: Diagnosis not present

## 2014-12-02 DIAGNOSIS — I4891 Unspecified atrial fibrillation: Secondary | ICD-10-CM | POA: Diagnosis not present

## 2014-12-02 DIAGNOSIS — M159 Polyosteoarthritis, unspecified: Secondary | ICD-10-CM | POA: Diagnosis not present

## 2014-12-02 DIAGNOSIS — I2510 Atherosclerotic heart disease of native coronary artery without angina pectoris: Secondary | ICD-10-CM | POA: Diagnosis not present

## 2014-12-03 ENCOUNTER — Telehealth: Payer: Self-pay | Admitting: Endocrinology

## 2014-12-03 DIAGNOSIS — M159 Polyosteoarthritis, unspecified: Secondary | ICD-10-CM | POA: Diagnosis not present

## 2014-12-03 DIAGNOSIS — I10 Essential (primary) hypertension: Secondary | ICD-10-CM | POA: Diagnosis not present

## 2014-12-03 DIAGNOSIS — I2510 Atherosclerotic heart disease of native coronary artery without angina pectoris: Secondary | ICD-10-CM | POA: Diagnosis not present

## 2014-12-03 DIAGNOSIS — Z471 Aftercare following joint replacement surgery: Secondary | ICD-10-CM | POA: Diagnosis not present

## 2014-12-03 DIAGNOSIS — I4891 Unspecified atrial fibrillation: Secondary | ICD-10-CM | POA: Diagnosis not present

## 2014-12-03 DIAGNOSIS — E119 Type 2 diabetes mellitus without complications: Secondary | ICD-10-CM | POA: Diagnosis not present

## 2014-12-03 NOTE — Telephone Encounter (Signed)
Patient husband stated that [patient got a call from you, please call

## 2014-12-03 NOTE — Telephone Encounter (Signed)
I contacted the patients husband and advised the call he had received was a reminder call for his wife's upcoming up appointment with Dr. Loanne Drilling on 12-07-2014 at 130 pm. Pt's husband voiced understanding.

## 2014-12-04 DIAGNOSIS — I2510 Atherosclerotic heart disease of native coronary artery without angina pectoris: Secondary | ICD-10-CM | POA: Diagnosis not present

## 2014-12-04 DIAGNOSIS — Z471 Aftercare following joint replacement surgery: Secondary | ICD-10-CM | POA: Diagnosis not present

## 2014-12-04 DIAGNOSIS — I10 Essential (primary) hypertension: Secondary | ICD-10-CM | POA: Diagnosis not present

## 2014-12-04 DIAGNOSIS — E119 Type 2 diabetes mellitus without complications: Secondary | ICD-10-CM | POA: Diagnosis not present

## 2014-12-04 DIAGNOSIS — I4891 Unspecified atrial fibrillation: Secondary | ICD-10-CM | POA: Diagnosis not present

## 2014-12-04 DIAGNOSIS — M159 Polyosteoarthritis, unspecified: Secondary | ICD-10-CM | POA: Diagnosis not present

## 2014-12-06 DIAGNOSIS — I10 Essential (primary) hypertension: Secondary | ICD-10-CM | POA: Diagnosis not present

## 2014-12-06 DIAGNOSIS — Z471 Aftercare following joint replacement surgery: Secondary | ICD-10-CM | POA: Diagnosis not present

## 2014-12-06 DIAGNOSIS — M159 Polyosteoarthritis, unspecified: Secondary | ICD-10-CM | POA: Diagnosis not present

## 2014-12-06 DIAGNOSIS — E119 Type 2 diabetes mellitus without complications: Secondary | ICD-10-CM | POA: Diagnosis not present

## 2014-12-06 DIAGNOSIS — I4891 Unspecified atrial fibrillation: Secondary | ICD-10-CM | POA: Diagnosis not present

## 2014-12-06 DIAGNOSIS — I2510 Atherosclerotic heart disease of native coronary artery without angina pectoris: Secondary | ICD-10-CM | POA: Diagnosis not present

## 2014-12-07 ENCOUNTER — Ambulatory Visit: Payer: Medicare Other | Admitting: Endocrinology

## 2014-12-08 ENCOUNTER — Institutional Professional Consult (permissible substitution): Payer: Medicare Other | Admitting: Diagnostic Neuroimaging

## 2014-12-08 DIAGNOSIS — M159 Polyosteoarthritis, unspecified: Secondary | ICD-10-CM | POA: Diagnosis not present

## 2014-12-08 DIAGNOSIS — I10 Essential (primary) hypertension: Secondary | ICD-10-CM | POA: Diagnosis not present

## 2014-12-08 DIAGNOSIS — I4891 Unspecified atrial fibrillation: Secondary | ICD-10-CM | POA: Diagnosis not present

## 2014-12-08 DIAGNOSIS — Z471 Aftercare following joint replacement surgery: Secondary | ICD-10-CM | POA: Diagnosis not present

## 2014-12-08 DIAGNOSIS — I2510 Atherosclerotic heart disease of native coronary artery without angina pectoris: Secondary | ICD-10-CM | POA: Diagnosis not present

## 2014-12-08 DIAGNOSIS — E119 Type 2 diabetes mellitus without complications: Secondary | ICD-10-CM | POA: Diagnosis not present

## 2014-12-10 DIAGNOSIS — E119 Type 2 diabetes mellitus without complications: Secondary | ICD-10-CM | POA: Diagnosis not present

## 2014-12-10 DIAGNOSIS — I2510 Atherosclerotic heart disease of native coronary artery without angina pectoris: Secondary | ICD-10-CM | POA: Diagnosis not present

## 2014-12-10 DIAGNOSIS — Z471 Aftercare following joint replacement surgery: Secondary | ICD-10-CM | POA: Diagnosis not present

## 2014-12-10 DIAGNOSIS — I4891 Unspecified atrial fibrillation: Secondary | ICD-10-CM | POA: Diagnosis not present

## 2014-12-10 DIAGNOSIS — M159 Polyosteoarthritis, unspecified: Secondary | ICD-10-CM | POA: Diagnosis not present

## 2014-12-10 DIAGNOSIS — I10 Essential (primary) hypertension: Secondary | ICD-10-CM | POA: Diagnosis not present

## 2014-12-13 DIAGNOSIS — I10 Essential (primary) hypertension: Secondary | ICD-10-CM | POA: Diagnosis not present

## 2014-12-13 DIAGNOSIS — I2510 Atherosclerotic heart disease of native coronary artery without angina pectoris: Secondary | ICD-10-CM | POA: Diagnosis not present

## 2014-12-13 DIAGNOSIS — M159 Polyosteoarthritis, unspecified: Secondary | ICD-10-CM | POA: Diagnosis not present

## 2014-12-13 DIAGNOSIS — Z471 Aftercare following joint replacement surgery: Secondary | ICD-10-CM | POA: Diagnosis not present

## 2014-12-13 DIAGNOSIS — E119 Type 2 diabetes mellitus without complications: Secondary | ICD-10-CM | POA: Diagnosis not present

## 2014-12-13 DIAGNOSIS — I4891 Unspecified atrial fibrillation: Secondary | ICD-10-CM | POA: Diagnosis not present

## 2014-12-13 NOTE — Discharge Summary (Signed)
[place]   Lara Mulch, MD   Carlynn Spry, PA-C [place], [place], [place]  [address]                             [phone]  PATIENT ID: Heather [name]        MRN:  [id number]          DOB/AGE: [birth year] / 80 y.o.    DISCHARGE SUMMARY  ADMISSION DATE:    11/29/2014 DISCHARGE DATE:   11/30/2014  ADMISSION DIAGNOSIS: PRIMARY OSTEOARTHRITIS RIGHT KNEE    DISCHARGE DIAGNOSIS:  PRIMARY OSTEOARTHRITIS RIGHT KNEE    ADDITIONAL DIAGNOSIS: Active Problems:   S/P total knee arthroplasty  Past Medical History  Diagnosis Date  . Coronary artery disease     post percutaneous coronary intervention in 2006  . Hyperlipidemia   . Restless legs     takes mirapex daily  . Fatigue   . Persistent disorder of initiating or maintaining sleep   . Dermatitis   . S/P inguinal herniorrhaphy   . DJD (degenerative joint disease) of knee 09/18/2011    knees- both, elbow- R, ankles   . Hypertension     takes medications daily  . GERD (gastroesophageal reflux disease)     prilosec daily  . H/O dizziness     pre-syncope/due to dehydration  . Occasional tremors   . Diverticulitis     hx of  . Gastric ulcer     hx of  . Angina   . Atrial fibrillation   . Dysrhythmia, cardiac     "palpitations"  . Borderline diabetes   . H/O hiatal hernia   . Stroke 2000    TIA- series that have resolved-  . Hx of total knee replacement 11/12/11    left knee  . UTI (lower urinary tract infection) 10/04/2013  . Medicare annual wellness visit, subsequent 11/22/2013  . Hip pain 08/28/2011  . Diabetes mellitus without complication     no meds    PROCEDURE: Procedure(s): RIGHT TOTAL KNEE ARTHROPLASTY on 11/29/2014  CONSULTS:     HISTORY:  See H&P in chart  HOSPITAL COURSE:  Heather [name] is a 80 y.o. admitted on 11/29/2014 and found to have a diagnosis of [place].  After appropriate laboratory studies were obtained  they were taken to the  operating room on 11/29/2014 and underwent Procedure(s): RIGHT TOTAL KNEE ARTHROPLASTY.   They were given perioperative antibiotics:  Anti-infectives    Start     Dose/Rate Route Frequency Ordered Stop   11/29/14 1400  ceFAZolin (ANCEF) IVPB 1 g/50 mL premix     1 g 100 mL/hr over 30 Minutes Intravenous Every 6 hours 11/29/14 1113 11/29/14 2244   11/29/14 0600  ceFAZolin (ANCEF) IVPB 2 g/50 mL premix     2 g 100 mL/hr over 30 Minutes Intravenous On call to O.R. 11/29/14 1497 11/29/14 0746   11/29/14 0544  ceFAZolin (ANCEF) IVPB 2 g/50 mL premix  Status:  Discontinued     2 g 100 mL/hr over 30 Minutes Intravenous On call to O.R. 11/29/14 0263 11/29/14 0546    .  Tolerated the procedure well.  Placed with a foley intraoperatively.  Given Ofirmev at induction and for 48 hours.    POD# 1: Vital signs were stable.  Patient denied Chest pain, shortness of breath, or calf pain.  Patient was started on Lovenox 30 mg subcutaneously  twice daily at 8am.  Consults to PT, OT, and care management were made.  The patient was weight bearing as tolerated.  CPM was placed on the operative leg 0-90 degrees for 6-8 hours a day.  Incentive spirometry was taught.  Dressing was changed.  Hemovac was discontinued.     Continued  PT for ambulation and exercise program.  IV saline locked.  O2 discontinued.    The remainder of the hospital course was dedicated to ambulation and strengthening.   The patient was discharged on 1 day post op in  Good condition.  Blood products given:none  DIAGNOSTIC STUDIES: Recent vital signs: No data found.      Recent laboratory studies: No results for input(s): WBC, HGB, HCT, PLT in the last 168 hours. No results for input(s): NA, K, CL, CO2, BUN, CREATININE, GLUCOSE, CALCIUM in the last 168 hours. Lab Results  Component Value Date   INR 0.98 11/16/2014   INR 0.91 11/06/2011   INR 0.94 09/24/2011     Recent Radiographic Studies :  US Soft Tissue  Head/neck  11/22/2014   CLINICAL DATA:  Thyroid abnormalities seen on carotid ultrasound.  EXAM: THYROID ULTRASOUND  TECHNIQUE: Ultrasound examination of the thyroid gland and adjacent soft tissues was performed.  COMPARISON:  None available currently.  FINDINGS: Right thyroid lobe  Measurements: 4.2 x 2.7 x 1.0 cm. 9 x 7 x 7 mm cystic nodule is noted in inferior pole. Cystic nodule with peripheral calcification measuring 12 x 9 x 5 mm is noted in upper pole.  Left thyroid lobe  Measurements: 4.3 x 2.5 x 1.0 cm. Predominantly solid nodule measuring 1.7 x 1.0 x 0.7 cm is noted in upper pole. Cystic nodule measuring 10 x 9 x 7 mm is noted in inferior pole.  Isthmus  Thickness: 9 mm. Solid nodule measuring 1.4 x 0.8 x 0.6 cm is noted on the right side. Predominantly solid nodule measuring 1.2 x 1.1 x 0.9 cm is noted on the left.  Lymphadenopathy  None visualized.  IMPRESSION: Bilateral thyroid nodules are noted. The largest is predominantly solid nodule measuring 1.7 cm in maximum diameter in the upper pole of left thyroid lobe. Findings meet consensus criteria for biopsy. Ultrasound-guided fine needle aspiration should be considered, as per the consensus statement: Management of Thyroid Nodules Detected at [place]: Society of Radiologists in [place]. Radiology 2005; [id number].   Electronically Signed   By: Marijo Conception, M.D.   On: 11/22/2014 12:53   Mm Digital Screening Bilateral  11/23/2014   CLINICAL DATA:  Screening.  EXAM: DIGITAL SCREENING BILATERAL MAMMOGRAM WITH CAD  COMPARISON:  Previous exam(s).  ACR Breast Density Category b: There are scattered areas of fibroglandular density.  FINDINGS: There are no findings suspicious for malignancy. Images were processed with CAD.  IMPRESSION: No mammographic evidence of malignancy. A result letter of this screening mammogram will be mailed directly to the patient.  RECOMMENDATION: Screening mammogram in one year. (Code:SM-B-01Y)   BI-RADS CATEGORY  1: Negative.   Electronically Signed   By: Conchita Paris M.D.   On: 11/23/2014 11:13    DISCHARGE INSTRUCTIONS: Discharge Instructions    CPM    Complete by:  As directed   Continuous passive motion machine (CPM):      Use the CPM from 0 to 90 for 6-8 hours per day.      You may increase by 10 per day.  You may break it up into 2 or 3 sessions per  day.      Use CPM for 2 weeks or until you are told to stop.     Call MD / Call 911    Complete by:  As directed   If you experience chest pain or shortness of breath, CALL 911 and be transported to the hospital emergency room.  If you develope a fever above 101 F, pus (white drainage) or increased drainage or redness at the wound, or calf pain, call your surgeon's office.     Change dressing    Complete by:  As directed   Change dressing on wednesday, then change the dressing daily with sterile 4 x 4 inch gauze dressing and apply TED hose.  You may clean the incision with alcohol prior to redressing.     Constipation Prevention    Complete by:  As directed   Drink plenty of fluids.  Prune juice may be helpful.  You may use a stool softener, such as Colace (over the counter) 100 mg twice a day.  Use MiraLax (over the counter) for constipation as needed.     Diet - low sodium heart healthy    Complete by:  As directed      Do not put a pillow under the knee. Place it under the heel.    Complete by:  As directed      Driving restrictions    Complete by:  As directed   No driving for 6 weeks     Increase activity slowly as tolerated    Complete by:  As directed      Lifting restrictions    Complete by:  As directed   No lifting for 6 weeks     TED hose    Complete by:  As directed   Use stockings (TED hose) for 2 weeks on both leg(s).  You may remove them at night for sleeping.           DISCHARGE MEDICATIONS:     Medication List    STOP taking these medications        aspirin 81 MG tablet      TAKE these  medications        amLODipine 10 MG tablet  Commonly known as:  NORVASC  Take 0.5 tablets (5 mg total) by mouth daily.     cholecalciferol 1000 UNITS tablet  Commonly known as:  VITAMIN D  Take 1,000 Units by mouth daily.     enoxaparin 40 MG/0.4ML injection  Commonly known as:  LOVENOX  Inject 0.4 mLs (40 mg total) into the skin daily.     glucose blood test strip  Commonly known as:  ONE TOUCH TEST STRIPS  Check BS daily and prn DX: E11.9     isosorbide mononitrate 60 MG 24 hr tablet  Commonly known as:  IMDUR  Take 0.5 tablets (30 mg total) by mouth daily.     losartan-hydrochlorothiazide 100-25 MG per tablet  Commonly known as:  HYZAAR  TAKE ONE TABLET BY MOUTH ONCE DAILY     magnesium oxide 400 MG tablet  Commonly known as:  MAG-OX  Take 400 mg by mouth 2 (two) times daily.     metFORMIN 500 MG tablet  Commonly known as:  GLUCOPHAGE  Take 1 tablet (500 mg total) by mouth daily with breakfast.     methocarbamol 500 MG tablet  Commonly known as:  ROBAXIN  Take 1-2 tablets (500-1,000 mg total) by mouth every 6 (six) hours as needed for  muscle spasms.     methylPREDNISolone 4 MG Tbpk tablet  Commonly known as:  MEDROL DOSEPAK  Use as directed on package instructions.     metoprolol succinate 50 MG 24 hr tablet  Commonly known as:  TOPROL-XL  TAKE ONE TABLET BY MOUTH TWICE DAILY IMMEDIATELY  FOLOWING  A  MEAL     omeprazole 20 MG capsule  Commonly known as:  PRILOSEC  Take 20 mg by mouth 2 (two) times daily before a meal.     ONE TOUCH ULTRA SYSTEM KIT W/DEVICE Kit  - 1 kit by Does not apply route once. DX: 250.00  - Check sugars daily as needed     ONETOUCH DELICA LANCETS FINE Misc  - Check sugars daily and prn  -   - DX 250.00     oxyCODONE 5 MG immediate release tablet  Commonly known as:  Oxy IR/ROXICODONE  Take 1-2 tablets (5-10 mg total) by mouth every 3 (three) hours as needed for breakthrough pain.     Oxycodone HCl 10 MG Tabs  Take 1 tablet  (10 mg total) by mouth 2 (two) times daily.     potassium chloride SA 20 MEQ tablet  Commonly known as:  KLOR-CON M20  Take 1 tablet (20 mEq total) by mouth daily.     pramipexole 0.5 MG tablet  Commonly known as:  MIRAPEX  Take 1 tablet (0.5 mg total) by mouth 2 (two) times daily.     simvastatin 20 MG tablet  Commonly known as:  ZOCOR  TAKE ONE TABLET BY MOUTH AT BEDTIME        FOLLOW UP VISIT:       Follow-up Information    Follow up with Rudean Haskell, MD. Call on 12/14/2014.   Specialty:  Orthopedic Surgery   Contact information:   [address] [place] [address] [phone]       DISPOSITION: HOME   CONDITION:  Good   Shenia Alan 12/13/2014, 10:07 AM

## 2014-12-14 DIAGNOSIS — Z96651 Presence of right artificial knee joint: Secondary | ICD-10-CM | POA: Diagnosis not present

## 2014-12-14 DIAGNOSIS — M25661 Stiffness of right knee, not elsewhere classified: Secondary | ICD-10-CM | POA: Diagnosis not present

## 2014-12-14 DIAGNOSIS — R262 Difficulty in walking, not elsewhere classified: Secondary | ICD-10-CM | POA: Diagnosis not present

## 2014-12-14 DIAGNOSIS — Z471 Aftercare following joint replacement surgery: Secondary | ICD-10-CM | POA: Diagnosis not present

## 2014-12-17 DIAGNOSIS — Z96651 Presence of right artificial knee joint: Secondary | ICD-10-CM | POA: Diagnosis not present

## 2014-12-17 DIAGNOSIS — R262 Difficulty in walking, not elsewhere classified: Secondary | ICD-10-CM | POA: Diagnosis not present

## 2014-12-17 DIAGNOSIS — M25661 Stiffness of right knee, not elsewhere classified: Secondary | ICD-10-CM | POA: Diagnosis not present

## 2014-12-20 ENCOUNTER — Other Ambulatory Visit (HOSPITAL_COMMUNITY): Payer: Self-pay | Admitting: Orthopedic Surgery

## 2014-12-20 DIAGNOSIS — R262 Difficulty in walking, not elsewhere classified: Secondary | ICD-10-CM | POA: Diagnosis not present

## 2014-12-20 DIAGNOSIS — R609 Edema, unspecified: Secondary | ICD-10-CM

## 2014-12-20 DIAGNOSIS — M25661 Stiffness of right knee, not elsewhere classified: Secondary | ICD-10-CM | POA: Diagnosis not present

## 2014-12-20 DIAGNOSIS — Z96651 Presence of right artificial knee joint: Secondary | ICD-10-CM | POA: Diagnosis not present

## 2014-12-21 ENCOUNTER — Telehealth: Payer: Self-pay

## 2014-12-21 ENCOUNTER — Ambulatory Visit (HOSPITAL_COMMUNITY)
Admission: RE | Admit: 2014-12-21 | Discharge: 2014-12-21 | Disposition: A | Discharge status: Home / Self Care | Payer: Medicare Other | Source: Ambulatory Visit | Attending: Orthopedic Surgery | Admitting: Orthopedic Surgery

## 2014-12-21 DIAGNOSIS — M79604 Pain in right leg: Secondary | ICD-10-CM | POA: Diagnosis present

## 2014-12-21 DIAGNOSIS — I808 Phlebitis and thrombophlebitis of other sites: Secondary | ICD-10-CM | POA: Diagnosis not present

## 2014-12-21 DIAGNOSIS — R609 Edema, unspecified: Secondary | ICD-10-CM

## 2014-12-21 NOTE — Telephone Encounter (Signed)
Hx. Right Total Knee Surgery- 11/29/14  Received call from Dr. Ruel Favors office regarding positive DVT report.   Office requested an appointment for patient with PCP either today or tomorrow.  No appointments available today.  Pt's PCP will be out of the office tomorrow (12/21/14).  Appointment scheduled tomorrow (12/21/14) with Elyn Aquas, PA-C at 7:00 am.  Per provider, pt is no longer on Lovenox, but will be started on ASA until appointment tomorrow.    Spoke with patient.  No acute distress noted.  Pt states that she had taken 81 mg of ASA this morning and has taken 162 mg of ASA this evening as advised by Dr. Ronnie Derby.  She has agreed to keep scheduled appointment tomorrow.

## 2014-12-21 NOTE — Progress Notes (Signed)
VASCULAR LAB PRELIMINARY  PRELIMINARY  PRELIMINARY  PRELIMINARY  Bilateral lower extremity venous duplex  completed.    Preliminary report:  Right:  DVT noted in the peroneal vein.  No evidence of superficial thrombosis.  No Baker's cyst.  Left:  No evidence of DVT, superficial thrombosis, or Baker's cyst.  Nolin Grell, RVT 12/21/2014, 1:59 PM

## 2014-12-22 ENCOUNTER — Encounter: Payer: Self-pay | Admitting: Physician Assistant

## 2014-12-22 ENCOUNTER — Ambulatory Visit (INDEPENDENT_AMBULATORY_CARE_PROVIDER_SITE_OTHER): Payer: Medicare Other | Admitting: Physician Assistant

## 2014-12-22 VITALS — BP 112/58 | HR 66 | Temp 97.9°F | Resp 16 | Wt 159.0 lb

## 2014-12-22 DIAGNOSIS — I82491 Acute embolism and thrombosis of other specified deep vein of right lower extremity: Secondary | ICD-10-CM | POA: Diagnosis not present

## 2014-12-22 DIAGNOSIS — I6523 Occlusion and stenosis of bilateral carotid arteries: Secondary | ICD-10-CM | POA: Diagnosis not present

## 2014-12-22 DIAGNOSIS — I82459 Acute embolism and thrombosis of unspecified peroneal vein: Secondary | ICD-10-CM

## 2014-12-22 DIAGNOSIS — I82451 Acute embolism and thrombosis of right peroneal vein: Secondary | ICD-10-CM

## 2014-12-22 HISTORY — DX: Acute embolism and thrombosis of unspecified peroneal vein: I82.459

## 2014-12-22 MED ORDER — RIVAROXABAN 15 MG PO TABS: 15.0000 mg | ORAL_TABLET | Freq: Two times a day (BID) | ORAL | Status: DC

## 2014-12-22 NOTE — Progress Notes (Signed)
Patient presents to clinic today at request of her Orthopedic Surgeon for management of a newly diagnosed DVT in R peroneal vein. Patient with recent orthopedic surgery at the end of may, endorses pain in R calf. Denies chest pain, shortness of breath or palpitations. Was started on ASA yesterday by her surgeon until she could be evaluated here in office.  Has history of a. Fib not caused by valvular disorder that is rate controlled.    Past Medical History  Diagnosis Date  . Coronary artery disease     post percutaneous coronary intervention in 2006  . Hyperlipidemia   . Restless legs     takes mirapex daily  . Fatigue   . Persistent disorder of initiating or maintaining sleep   . Dermatitis   . S/P inguinal herniorrhaphy   . DJD (degenerative joint disease) of knee 09/18/2011    knees- both, elbow- R, ankles   . Hypertension     takes medications daily  . GERD (gastroesophageal reflux disease)     prilosec daily  . H/O dizziness     pre-syncope/due to dehydration  . Occasional tremors   . Diverticulitis     hx of  . Gastric ulcer     hx of  . Angina   . Atrial fibrillation   . Dysrhythmia, cardiac     "palpitations"  . Borderline diabetes   . H/O hiatal hernia   . Stroke 2000    TIA- series that have resolved-  . Hx of total knee replacement 11/12/11    left knee  . UTI (lower urinary tract infection) 10/04/2013  . Medicare annual wellness visit, subsequent 11/22/2013  . Hip pain 08/28/2011  . Diabetes mellitus without complication     no meds    Current Outpatient Prescriptions on File Prior to Visit  Medication Sig Dispense Refill  . amLODipine (NORVASC) 10 MG tablet Take 0.5 tablets (5 mg total) by mouth daily. 45 tablet 1  . Blood Glucose Monitoring Suppl (ONE TOUCH ULTRA SYSTEM KIT) W/DEVICE KIT 1 kit by Does not apply route once. DX: 250.00 Check sugars daily as needed 1 each 0  . cholecalciferol (VITAMIN D) 1000 UNITS tablet Take 1,000 Units by mouth daily.      Marland Kitchen glucose blood (ONE TOUCH TEST STRIPS) test strip Check BS daily and prn DX: E11.9 100 each 11  . isosorbide mononitrate (IMDUR) 60 MG 24 hr tablet Take 0.5 tablets (30 mg total) by mouth daily. 45 tablet 2  . losartan-hydrochlorothiazide (HYZAAR) 100-25 MG per tablet TAKE ONE TABLET BY MOUTH ONCE DAILY 90 tablet 3  . magnesium oxide (MAG-OX) 400 MG tablet Take 400 mg by mouth 2 (two) times daily.     . metFORMIN (GLUCOPHAGE) 500 MG tablet Take 1 tablet (500 mg total) by mouth daily with breakfast. 90 tablet 1  . metoprolol succinate (TOPROL-XL) 50 MG 24 hr tablet TAKE ONE TABLET BY MOUTH TWICE DAILY IMMEDIATELY  FOLOWING  A  MEAL 180 tablet 2  . omeprazole (PRILOSEC) 20 MG capsule Take 20 mg by mouth 2 (two) times daily before a meal.    . ONETOUCH DELICA LANCETS FINE MISC Check sugars daily and prn  DX 250.00 100 each 1  . oxyCODONE (OXY IR/ROXICODONE) 5 MG immediate release tablet Take 1-2 tablets (5-10 mg total) by mouth every 3 (three) hours as needed for breakthrough pain. 30 tablet 0  . Oxycodone HCl 10 MG TABS Take 1 tablet (10 mg total) by mouth 2 (two)  times daily. 30 tablet 0  . potassium chloride SA (KLOR-CON M20) 20 MEQ tablet Take 1 tablet (20 mEq total) by mouth daily. 90 tablet 2  . pramipexole (MIRAPEX) 0.5 MG tablet Take 1 tablet (0.5 mg total) by mouth 2 (two) times daily. 60 tablet 3  . simvastatin (ZOCOR) 20 MG tablet TAKE ONE TABLET BY MOUTH AT BEDTIME 90 tablet 3  . [DISCONTINUED] nitroGLYCERIN (NITROSTAT) 0.4 MG SL tablet Place 0.4 mg under the tongue every 5 (five) minutes as needed.       No current facility-administered medications on file prior to visit.    No Known Allergies  Family History  Problem Relation Age of Onset  . Arthritis      family hx of  . Hypertension      family hx of  . Other      family hx of cardiovascular disorder  . Stroke Father     family hx of M 1st degree relative <50  . Coronary artery disease Mother   . Heart disease Mother    . Depression Brother   . Breast cancer Neg Hx   . Colon cancer Neg Hx   . Anesthesia problems Neg Hx   . Hypotension Neg Hx   . Malignant hyperthermia Neg Hx   . Pseudochol deficiency Neg Hx   . Stroke Brother   . Diabetes Brother   . Cancer Brother     bladder with mets  . Diabetes Daughter     borderline  . Hypertension Daughter     History   Social History  . Marital Status: Married    Spouse Name: N/A  . Number of Children: N/A  . Years of Education: N/A   Occupational History  . works partime in office    Social History Main Topics  . Smoking status: Never Smoker   . Smokeless tobacco: Never Used  . Alcohol Use: No  . Drug Use: No  . Sexual Activity: Yes    Birth Control/ Protection: Surgical     Comment: lives with husband, no dietary restrictions   Other Topics Concern  . None   Social History Narrative   Review of Systems - See HPI.  All other ROS are negative.  BP 112/58 mmHg  Pulse 66  Temp(Src) 97.9 F (36.6 C) (Oral)  Resp 16  Wt 159 lb (72.122 kg)  SpO2 98%  Physical Exam  Constitutional: She is oriented to person, place, and time and well-developed, well-nourished, and in no distress.  HENT:  Head: Normocephalic and atraumatic.  Eyes: Conjunctivae are normal.  Cardiovascular: Normal rate, regular rhythm, normal heart sounds and intact distal pulses.   Pulmonary/Chest: Effort normal and breath sounds normal. No respiratory distress. She has no wheezes. She has no rales. She exhibits no tenderness.  Neurological: She is alert and oriented to person, place, and time.  Skin: Skin is warm and dry. No rash noted.  Vitals reviewed.   Recent Results (from the past 2160 hour(s))  CBC     Status: Abnormal   Collection Time: 10/29/14  7:45 AM  Result Value Ref Range   WBC 12.4 (H) 4.0 - 10.5 K/uL   RBC 4.51 3.87 - 5.11 Mil/uL   Platelets 151.0 150.0 - 400.0 K/uL   Hemoglobin 13.5 12.0 - 15.0 g/dL   HCT 40.3 36.0 - 46.0 %   MCV 89.2 78.0 -  100.0 fl   MCHC 33.6 30.0 - 36.0 g/dL   RDW 12.9 11.5 - 15.5 %  Comprehensive metabolic panel     Status: Abnormal   Collection Time: 10/29/14  7:45 AM  Result Value Ref Range   Sodium 139 135 - 145 mEq/L   Potassium 4.1 3.5 - 5.1 mEq/L   Chloride 102 96 - 112 mEq/L   CO2 31 19 - 32 mEq/L   Glucose, Bld 133 (H) 70 - 99 mg/dL   BUN 25 (H) 6 - 23 mg/dL   Creatinine, Ser 1.03 0.40 - 1.20 mg/dL   Total Bilirubin 0.5 0.2 - 1.2 mg/dL   Alkaline Phosphatase 77 39 - 117 U/L   AST 17 0 - 37 U/L   ALT 16 0 - 35 U/L   Total Protein 6.7 6.0 - 8.3 g/dL   Albumin 4.2 3.5 - 5.2 g/dL   Calcium 9.5 8.4 - 10.5 mg/dL   GFR 55.07 (L) >60.00 mL/min  CBC with Differential/Platelet     Status: None   Collection Time: 11/04/14  8:06 AM  Result Value Ref Range   WBC 5.0 4.0 - 10.5 K/uL   RBC 4.43 3.87 - 5.11 Mil/uL   Hemoglobin 13.2 12.0 - 15.0 g/dL   HCT 39.6 36.0 - 46.0 %   MCV 89.4 78.0 - 100.0 fl   MCHC 33.3 30.0 - 36.0 g/dL   RDW 13.1 11.5 - 15.5 %   Platelets 154.0 150.0 - 400.0 K/uL   Neutrophils Relative % 53.7 43.0 - 77.0 %   Lymphocytes Relative 31.1 12.0 - 46.0 %   Monocytes Relative 9.9 3.0 - 12.0 %   Eosinophils Relative 4.7 0.0 - 5.0 %   Basophils Relative 0.6 0.0 - 3.0 %   Neutro Abs 2.7 1.4 - 7.7 K/uL   Lymphs Abs 1.6 0.7 - 4.0 K/uL   Monocytes Absolute 0.5 0.1 - 1.0 K/uL   Eosinophils Absolute 0.2 0.0 - 0.7 K/uL   Basophils Absolute 0.0 0.0 - 0.1 K/uL  APTT     Status: None   Collection Time: 11/16/14  9:48 AM  Result Value Ref Range   aPTT 28 24 - 37 seconds  CBC WITH DIFFERENTIAL     Status: None   Collection Time: 11/16/14  9:48 AM  Result Value Ref Range   WBC 7.0 4.0 - 10.5 K/uL   RBC 4.63 3.87 - 5.11 MIL/uL   Hemoglobin 13.9 12.0 - 15.0 g/dL   HCT 41.5 36.0 - 46.0 %   MCV 89.6 78.0 - 100.0 fL   MCH 30.0 26.0 - 34.0 pg   MCHC 33.5 30.0 - 36.0 g/dL   RDW 13.1 11.5 - 15.5 %   Platelets 176 150 - 400 K/uL   Neutrophils Relative % 63 43 - 77 %   Neutro Abs 4.4 1.7  - 7.7 K/uL   Lymphocytes Relative 24 12 - 46 %   Lymphs Abs 1.7 0.7 - 4.0 K/uL   Monocytes Relative 10 3 - 12 %   Monocytes Absolute 0.7 0.1 - 1.0 K/uL   Eosinophils Relative 3 0 - 5 %   Eosinophils Absolute 0.2 0.0 - 0.7 K/uL   Basophils Relative 0 0 - 1 %   Basophils Absolute 0.0 0.0 - 0.1 K/uL  Comprehensive metabolic panel     Status: Abnormal   Collection Time: 11/16/14  9:48 AM  Result Value Ref Range   Sodium 138 135 - 145 mmol/L   Potassium 3.9 3.5 - 5.1 mmol/L   Chloride 103 101 - 111 mmol/L   CO2 26 22 - 32 mmol/L   Glucose,  Bld 159 (H) 70 - 99 mg/dL   BUN 17 6 - 20 mg/dL   Creatinine, Ser 0.92 0.44 - 1.00 mg/dL   Calcium 9.0 8.9 - 10.3 mg/dL   Total Protein 6.0 (L) 6.5 - 8.1 g/dL   Albumin 3.6 3.5 - 5.0 g/dL   AST 18 15 - 41 U/L   ALT 22 14 - 54 U/L   Alkaline Phosphatase 87 38 - 126 U/L   Total Bilirubin 0.9 0.3 - 1.2 mg/dL   GFR calc non Af Amer 58 (L) >60 mL/min   GFR calc Af Amer >60 >60 mL/min    Comment: (NOTE) The eGFR has been calculated using the CKD EPI equation. This calculation has not been validated in all clinical situations. eGFR's persistently <60 mL/min signify possible Chronic Kidney Disease.    Anion gap 9 5 - 15  Protime-INR     Status: None   Collection Time: 11/16/14  9:48 AM  Result Value Ref Range   Prothrombin Time 13.1 11.6 - 15.2 seconds   INR 0.98 0.00 - 1.49  Urinalysis, Routine w reflex microscopic     Status: None   Collection Time: 11/16/14  9:48 AM  Result Value Ref Range   Color, Urine YELLOW YELLOW   APPearance CLEAR CLEAR   Specific Gravity, Urine 1.009 1.005 - 1.030   pH 8.0 5.0 - 8.0   Glucose, UA NEGATIVE NEGATIVE mg/dL   Hgb urine dipstick NEGATIVE NEGATIVE   Bilirubin Urine NEGATIVE NEGATIVE   Ketones, ur NEGATIVE NEGATIVE mg/dL   Protein, ur NEGATIVE NEGATIVE mg/dL   Urobilinogen, UA 0.2 0.0 - 1.0 mg/dL   Nitrite NEGATIVE NEGATIVE   Leukocytes, UA NEGATIVE NEGATIVE    Comment: MICROSCOPIC NOT DONE ON URINES  WITH NEGATIVE PROTEIN, BLOOD, LEUKOCYTES, NITRITE, OR GLUCOSE <1000 mg/dL.  Urine culture     Status: None   Collection Time: 11/16/14  9:48 AM  Result Value Ref Range   Specimen Description URINE, CLEAN CATCH    Special Requests NONE    Colony Count      8,000 COLONIES/ML Performed at [facility]    Culture      INSIGNIFICANT GROWTH Performed at [facility]    Report Status 11/17/2014 FINAL   Surgical pcr screen     Status: None   Collection Time: 11/16/14 10:05 AM  Result Value Ref Range   MRSA, PCR NEGATIVE NEGATIVE   Staphylococcus aureus NEGATIVE NEGATIVE    Comment:        The Xpert SA Assay (FDA approved for NASAL specimens in patients over 18 years of age), is one component of a comprehensive surveillance program.  Test performance has been validated by [facility] for patients greater than or equal to 68 year old. It is not intended to diagnose infection nor to guide or monitor treatment.   Glucose, capillary     Status: Abnormal   Collection Time: 11/29/14  5:55 AM  Result Value Ref Range   Glucose-Capillary 146 (H) 65 - 99 mg/dL   Comment 1 Notify RN   Glucose, capillary     Status: Abnormal   Collection Time: 11/29/14  9:17 AM  Result Value Ref Range   Glucose-Capillary 162 (H) 65 - 99 mg/dL   Comment 1 Notify RN    Comment 2 Document in Chart   Glucose, capillary     Status: Abnormal   Collection Time: 11/29/14 11:39 AM  Result Value Ref Range   Glucose-Capillary 238 (H) 65 -  99 mg/dL   Comment 1 Repeat Test    Comment 2 Document in Chart   Glucose, capillary     Status: Abnormal   Collection Time: 11/29/14  4:36 PM  Result Value Ref Range   Glucose-Capillary 172 (H) 65 - 99 mg/dL   Comment 1 Repeat Test    Comment 2 Document in Chart   Hemoglobin A1c     Status: Abnormal   Collection Time: 11/29/14  4:40 PM  Result Value Ref Range   Hgb A1c MFr Bld 7.0 (H) 4.8 - 5.6 %    Comment: (NOTE)         Pre-diabetes: 5.7 - 6.4          Diabetes: >6.4         Glycemic control for adults with diabetes: <7.0    Mean Plasma Glucose 154 mg/dL    Comment: (NOTE) Performed At: [facility] [place], [place] [id number] Lindon Romp MD AU:[id number]   CBC     Status: None   Collection Time: 11/29/14  4:40 PM  Result Value Ref Range   WBC 9.3 4.0 - 10.5 K/uL   RBC 4.21 3.87 - 5.11 MIL/uL   Hemoglobin 12.4 12.0 - 15.0 g/dL   HCT 38.3 36.0 - 46.0 %   MCV 91.0 78.0 - 100.0 fL   MCH 29.5 26.0 - 34.0 pg   MCHC 32.4 30.0 - 36.0 g/dL   RDW 13.2 11.5 - 15.5 %   Platelets 157 150 - 400 K/uL  Creatinine, serum     Status: Abnormal   Collection Time: 11/29/14  4:40 PM  Result Value Ref Range   Creatinine, Ser 0.95 0.44 - 1.00 mg/dL   GFR calc non Af Amer 56 (L) >60 mL/min   GFR calc Af Amer >60 >60 mL/min    Comment: (NOTE) The eGFR has been calculated using the CKD EPI equation. This calculation has not been validated in all clinical situations. eGFR's persistently <60 mL/min signify possible Chronic Kidney Disease.   Glucose, capillary     Status: Abnormal   Collection Time: 11/29/14 10:11 PM  Result Value Ref Range   Glucose-Capillary 217 (H) 65 - 99 mg/dL  Basic metabolic panel     Status: Abnormal   Collection Time: 11/30/14  5:32 AM  Result Value Ref Range   Sodium 139 135 - 145 mmol/L   Potassium 3.8 3.5 - 5.1 mmol/L   Chloride 105 101 - 111 mmol/L   CO2 26 22 - 32 mmol/L   Glucose, Bld 136 (H) 65 - 99 mg/dL   BUN 22 (H) 6 - 20 mg/dL   Creatinine, Ser 0.84 0.44 - 1.00 mg/dL   Calcium 8.5 (L) 8.9 - 10.3 mg/dL   GFR calc non Af Amer >60 >60 mL/min   GFR calc Af Amer >60 >60 mL/min    Comment: (NOTE) The eGFR has been calculated using the CKD EPI equation. This calculation has not been validated in all clinical situations. eGFR's persistently <60 mL/min signify possible Chronic Kidney Disease.    Anion gap 8 5 - 15  CBC     Status: Abnormal   Collection Time: 11/30/14  5:32 AM    Result Value Ref Range   WBC 11.3 (H) 4.0 - 10.5 K/uL   RBC 3.82 (L) 3.87 - 5.11 MIL/uL   Hemoglobin 11.4 (L) 12.0 - 15.0 g/dL   HCT 34.7 (L) 36.0 - 46.0 %   MCV 90.8 78.0 - 100.0 fL  MCH 29.8 26.0 - 34.0 pg   MCHC 32.9 30.0 - 36.0 g/dL   RDW 13.3 11.5 - 15.5 %   Platelets 128 (L) 150 - 400 K/uL  Glucose, capillary     Status: Abnormal   Collection Time: 11/30/14  7:01 AM  Result Value Ref Range   Glucose-Capillary 125 (H) 65 - 99 mg/dL  Glucose, capillary     Status: Abnormal   Collection Time: 11/30/14 11:20 AM  Result Value Ref Range   Glucose-Capillary 164 (H) 65 - 99 mg/dL    Assessment/Plan: Peroneal DVT (deep venous thrombosis) In patient with A. Fib without valvular disorder or mechanical valves. Echocardiogram 2013 reviewed. Cr Clearance at 64. Will begin Xarelto 15 mg BID x 21 days, then transition to 20 mg QD. Samples of 15 mg tablets given to complete 21 day course. Alarm signs/symptoms prompting 911 call discussed.  Follow-up 2 weeks.

## 2014-12-22 NOTE — Progress Notes (Signed)
Pre visit review using our clinic review tool, if applicable. No additional management support is needed unless otherwise documented below in the visit note. 

## 2014-12-22 NOTE — Patient Instructions (Signed)
Please take the Xarelto as directed:  (1) 15 mg twice daily for 3 weeks.  (2) Once you run out, start the 20 mg tablets once daily.  Continue your daily activities.  Follow-up in 2 weeks.  IF you develop chest pain or shortness of breath, please call 911 as this may be a sign the clot has moved.

## 2014-12-22 NOTE — Assessment & Plan Note (Addendum)
In patient with A. Fib without valvular disorder or mechanical valves. Echocardiogram 2013 reviewed. Cr Clearance at 64. Will begin Xarelto 15 mg BID x 21 days, then transition to 20 mg QD. Samples of 15 mg tablets given to complete 21 day course. Alarm signs/symptoms prompting 911 call discussed.  Follow-up 2 weeks.

## 2014-12-27 DIAGNOSIS — R262 Difficulty in walking, not elsewhere classified: Secondary | ICD-10-CM | POA: Diagnosis not present

## 2014-12-27 DIAGNOSIS — M25661 Stiffness of right knee, not elsewhere classified: Secondary | ICD-10-CM | POA: Diagnosis not present

## 2014-12-27 DIAGNOSIS — Z96651 Presence of right artificial knee joint: Secondary | ICD-10-CM | POA: Diagnosis not present

## 2014-12-29 DIAGNOSIS — M25661 Stiffness of right knee, not elsewhere classified: Secondary | ICD-10-CM | POA: Diagnosis not present

## 2014-12-29 DIAGNOSIS — R262 Difficulty in walking, not elsewhere classified: Secondary | ICD-10-CM | POA: Diagnosis not present

## 2014-12-29 DIAGNOSIS — Z96651 Presence of right artificial knee joint: Secondary | ICD-10-CM | POA: Diagnosis not present

## 2014-12-31 ENCOUNTER — Ambulatory Visit (HOSPITAL_BASED_OUTPATIENT_CLINIC_OR_DEPARTMENT_OTHER)
Admission: RE | Admit: 2014-12-31 | Discharge: 2014-12-31 | Disposition: A | Discharge status: Home / Self Care | Payer: Medicare Other | Source: Ambulatory Visit | Attending: Family Medicine | Admitting: Family Medicine

## 2014-12-31 ENCOUNTER — Encounter: Payer: Self-pay | Admitting: Family Medicine

## 2014-12-31 ENCOUNTER — Ambulatory Visit (INDEPENDENT_AMBULATORY_CARE_PROVIDER_SITE_OTHER): Payer: Medicare Other | Admitting: Family Medicine

## 2014-12-31 VITALS — BP 118/72 | HR 73 | Temp 97.8°F | Ht 63.0 in | Wt 161.1 lb

## 2014-12-31 DIAGNOSIS — E118 Type 2 diabetes mellitus with unspecified complications: Secondary | ICD-10-CM | POA: Diagnosis not present

## 2014-12-31 DIAGNOSIS — M1711 Unilateral primary osteoarthritis, right knee: Secondary | ICD-10-CM

## 2014-12-31 DIAGNOSIS — E785 Hyperlipidemia, unspecified: Secondary | ICD-10-CM | POA: Diagnosis not present

## 2014-12-31 DIAGNOSIS — M25511 Pain in right shoulder: Secondary | ICD-10-CM

## 2014-12-31 DIAGNOSIS — I82491 Acute embolism and thrombosis of other specified deep vein of right lower extremity: Secondary | ICD-10-CM

## 2014-12-31 DIAGNOSIS — I10 Essential (primary) hypertension: Secondary | ICD-10-CM | POA: Diagnosis not present

## 2014-12-31 DIAGNOSIS — D649 Anemia, unspecified: Secondary | ICD-10-CM

## 2014-12-31 DIAGNOSIS — K219 Gastro-esophageal reflux disease without esophagitis: Secondary | ICD-10-CM

## 2014-12-31 DIAGNOSIS — I6523 Occlusion and stenosis of bilateral carotid arteries: Secondary | ICD-10-CM

## 2014-12-31 DIAGNOSIS — M542 Cervicalgia: Secondary | ICD-10-CM

## 2014-12-31 DIAGNOSIS — I82451 Acute embolism and thrombosis of right peroneal vein: Secondary | ICD-10-CM

## 2014-12-31 LAB — COMPREHENSIVE METABOLIC PANEL
ALK PHOS: 81 U/L (ref 39–117)
ALT: 9 U/L (ref 0–35)
AST: 16 U/L (ref 0–37)
Albumin: 3.9 g/dL (ref 3.5–5.2)
BILIRUBIN TOTAL: 0.7 mg/dL (ref 0.2–1.2)
BUN: 22 mg/dL (ref 6–23)
CO2: 31 mEq/L (ref 19–32)
Calcium: 9.5 mg/dL (ref 8.4–10.5)
Chloride: 100 mEq/L (ref 96–112)
Creatinine, Ser: 1.12 mg/dL (ref 0.40–1.20)
GFR: 49.98 mL/min — ABNORMAL LOW (ref >60.00)
Glucose, Bld: 159 mg/dL — ABNORMAL HIGH (ref 70–99)
POTASSIUM: 3.6 meq/L (ref 3.5–5.1)
Sodium: 137 mEq/L (ref 135–145)
Total Protein: 6.5 g/dL (ref 6.0–8.3)

## 2014-12-31 LAB — CBC
HCT: 36.5 % (ref 36.0–46.0)
Hemoglobin: 11.9 g/dL — ABNORMAL LOW (ref 12.0–15.0)
MCHC: 32.7 g/dL (ref 30.0–36.0)
MCV: 89.7 fl (ref 78.0–100.0)
PLATELETS: 201 10*3/uL (ref 150.0–400.0)
RBC: 4.06 Mil/uL (ref 3.87–5.11)
RDW: 13.9 % (ref 11.5–15.5)
WBC: 6.7 10*3/uL (ref 4.0–10.5)

## 2014-12-31 LAB — LIPID PANEL
Cholesterol: 131 mg/dL (ref 0–200)
HDL: 44.8 mg/dL (ref >39.00)
LDL CALC: 59 mg/dL (ref 0–99)
NonHDL: 86.2
TRIGLYCERIDES: 138 mg/dL (ref 0.0–149.0)
Total CHOL/HDL Ratio: 3
VLDL: 27.6 mg/dL (ref 0.0–40.0)

## 2014-12-31 LAB — TSH: TSH: 1.74 u[IU]/mL (ref 0.35–4.50)

## 2014-12-31 LAB — HEMOGLOBIN A1C: HEMOGLOBIN A1C: 6.4 % (ref 4.6–6.5)

## 2014-12-31 MED ORDER — RIVAROXABAN 20 MG PO TABS: 20.0000 mg | ORAL_TABLET | Freq: Every day | ORAL | Status: DC

## 2014-12-31 NOTE — Patient Instructions (Signed)

## 2014-12-31 NOTE — Progress Notes (Signed)
Pre visit review using our clinic review tool, if applicable. No additional management support is needed unless otherwise documented below in the visit note. 

## 2015-01-01 ENCOUNTER — Other Ambulatory Visit: Payer: Self-pay | Admitting: Family Medicine

## 2015-01-03 ENCOUNTER — Other Ambulatory Visit: Payer: Self-pay

## 2015-01-04 DIAGNOSIS — M25661 Stiffness of right knee, not elsewhere classified: Secondary | ICD-10-CM | POA: Diagnosis not present

## 2015-01-04 DIAGNOSIS — R262 Difficulty in walking, not elsewhere classified: Secondary | ICD-10-CM | POA: Diagnosis not present

## 2015-01-04 DIAGNOSIS — Z96651 Presence of right artificial knee joint: Secondary | ICD-10-CM | POA: Diagnosis not present

## 2015-01-07 DIAGNOSIS — R262 Difficulty in walking, not elsewhere classified: Secondary | ICD-10-CM | POA: Diagnosis not present

## 2015-01-07 DIAGNOSIS — Z96651 Presence of right artificial knee joint: Secondary | ICD-10-CM | POA: Diagnosis not present

## 2015-01-07 DIAGNOSIS — M25661 Stiffness of right knee, not elsewhere classified: Secondary | ICD-10-CM | POA: Diagnosis not present

## 2015-01-16 ENCOUNTER — Telehealth: Payer: Self-pay | Admitting: Family Medicine

## 2015-01-16 DIAGNOSIS — I82441 Acute embolism and thrombosis of right tibial vein: Secondary | ICD-10-CM | POA: Diagnosis not present

## 2015-01-16 DIAGNOSIS — M25551 Pain in right hip: Secondary | ICD-10-CM | POA: Diagnosis not present

## 2015-01-16 DIAGNOSIS — K219 Gastro-esophageal reflux disease without esophagitis: Secondary | ICD-10-CM | POA: Diagnosis not present

## 2015-01-16 DIAGNOSIS — Z7901 Long term (current) use of anticoagulants: Secondary | ICD-10-CM | POA: Diagnosis not present

## 2015-01-16 DIAGNOSIS — M79604 Pain in right leg: Secondary | ICD-10-CM | POA: Diagnosis not present

## 2015-01-16 DIAGNOSIS — E119 Type 2 diabetes mellitus without complications: Secondary | ICD-10-CM | POA: Diagnosis not present

## 2015-01-16 DIAGNOSIS — E780 Pure hypercholesterolemia: Secondary | ICD-10-CM | POA: Diagnosis not present

## 2015-01-16 DIAGNOSIS — M79661 Pain in right lower leg: Secondary | ICD-10-CM | POA: Diagnosis not present

## 2015-01-16 DIAGNOSIS — M545 Low back pain: Secondary | ICD-10-CM | POA: Diagnosis not present

## 2015-01-16 DIAGNOSIS — Z955 Presence of coronary angioplasty implant and graft: Secondary | ICD-10-CM | POA: Diagnosis not present

## 2015-01-16 DIAGNOSIS — M549 Dorsalgia, unspecified: Secondary | ICD-10-CM | POA: Diagnosis not present

## 2015-01-16 DIAGNOSIS — M25561 Pain in right knee: Secondary | ICD-10-CM | POA: Diagnosis not present

## 2015-01-16 DIAGNOSIS — I82401 Acute embolism and thrombosis of unspecified deep veins of right lower extremity: Secondary | ICD-10-CM | POA: Diagnosis not present

## 2015-01-16 DIAGNOSIS — I82890 Acute embolism and thrombosis of other specified veins: Secondary | ICD-10-CM | POA: Diagnosis not present

## 2015-01-16 DIAGNOSIS — I10 Essential (primary) hypertension: Secondary | ICD-10-CM | POA: Diagnosis not present

## 2015-01-16 DIAGNOSIS — L989 Disorder of the skin and subcutaneous tissue, unspecified: Secondary | ICD-10-CM | POA: Diagnosis not present

## 2015-01-16 DIAGNOSIS — Z96651 Presence of right artificial knee joint: Secondary | ICD-10-CM | POA: Diagnosis not present

## 2015-01-16 DIAGNOSIS — M47816 Spondylosis without myelopathy or radiculopathy, lumbar region: Secondary | ICD-10-CM | POA: Diagnosis not present

## 2015-01-16 NOTE — Assessment & Plan Note (Signed)
Increase leafy greens, consider increased lean red meat and using cast iron cookware. Continue to monitor, report any concerns 

## 2015-01-16 NOTE — Telephone Encounter (Signed)
Called by [facility] secondary to [facility] ER. She had presented to ER with worsening pain in lower extremity pain. Called by a PA, sheotes pain witth weight bearing, but no new swelling, warmth or trauma. They thought we were the orthopaedics office. They are going to rule out a worsening blood blot and infection with labs and imaging and then send her home to follow up with [place] and Dr Ladonna Snide her orthopaedic surgeon.

## 2015-01-16 NOTE — Assessment & Plan Note (Signed)
Tolerating Heather [name]

## 2015-01-16 NOTE — Assessment & Plan Note (Signed)
With some right claviclular pain, xray reveals arthritic changes but no acute concerns. Can consider referral for further care if pain worsens. Try topical treatments such as Jenel Lucks for now

## 2015-01-16 NOTE — Assessment & Plan Note (Signed)
Tolerating statin, encouraged heart healthy diet, avoid trans fats, minimize simple carbs and saturated fats. Increase exercise as tolerated 

## 2015-01-16 NOTE — Assessment & Plan Note (Addendum)
Right knee continues to cause pain, but is improving s/p TKR with Dr Ronnie Derby on 11/29/2014

## 2015-01-16 NOTE — Progress Notes (Signed)
Heather [name]  [id number] June 25, 1935 01/16/2015      Progress Note-Follow Up  Subjective  Chief Complaint  Chief Complaint  Patient presents with  . Follow-up    HPI  Patient is a 80 y.o. female in today for routine medical care. Patient is in today for follow-up. She underwent a total knee replacement in May and then suffered deep venous thrombosis in the right leg in June. At this point she is tolerating Xarelto and her pain is improving. She denies any new or acute concerns since that time though she does have complaints of ongoing pain. She notes her knee pain is improving status post the surgery but slowly. She also complaining of pain over her right clavicle. She denies any recent trauma or injury. No swelling or warmth. Denies CP/palp/SOB/HA/congestion/fevers/GI or GU c/o. Taking meds as prescribed  Past Medical History  Diagnosis Date  . Coronary artery disease     post percutaneous coronary intervention in 2006  . Hyperlipidemia   . Restless legs     takes mirapex daily  . Fatigue   . Persistent disorder of initiating or maintaining sleep   . Dermatitis   . S/P inguinal herniorrhaphy   . DJD (degenerative joint disease) of knee 09/18/2011    knees- both, elbow- R, ankles   . Hypertension     takes medications daily  . GERD (gastroesophageal reflux disease)     prilosec daily  . H/O dizziness     pre-syncope/due to dehydration  . Occasional tremors   . Diverticulitis     hx of  . Gastric ulcer     hx of  . Angina   . Atrial fibrillation   . Dysrhythmia, cardiac     "palpitations"  . Borderline diabetes   . H/O hiatal hernia   . Stroke 2000    TIA- series that have resolved-  . Hx of total knee replacement 11/12/11    left knee  . UTI (lower urinary tract infection) 10/04/2013  . Medicare annual wellness visit, subsequent 11/22/2013  . Hip pain 08/28/2011  . Diabetes mellitus without complication     no meds    Past Surgical History  Procedure  Laterality Date  . Abdominal hysterectomy    . Knee arthroscopy      2 on left and 1 on right  . Foot surgery      left foot stress fracture repair  . Hernia repair      1989- esophageal hernia   . Upper gastrointestinal endoscopy  2013  . Total knee arthroplasty  11/12/2011    Procedure: TOTAL KNEE ARTHROPLASTY;  Surgeon: Lorn Junes, MD;  Location: [facility];  Service: Orthopedics;  Laterality: Left;  Dr Noemi Chapel wants 90 minutes for this case  . Coronary angioplasty  03/2005; 06/2005    "1" plus "1"= "2 total"  . Eye surgery  11-16-13    cataract -right eye  . Brain surgery      related to stroke   2000  . Joint replacement    . Total knee arthroplasty Right 11/29/2014    Procedure: RIGHT TOTAL KNEE ARTHROPLASTY;  Surgeon: Vickey Huger, MD;  Location: [facility];  Service: Orthopedics;  Laterality: Right;    Family History  Problem Relation Age of Onset  . Arthritis      family hx of  . Hypertension      family hx of  . Other      family hx of cardiovascular disorder  .  Stroke Father     family hx of M 1st degree relative <50  . Coronary artery disease Mother   . Heart disease Mother   . Depression Brother   . Breast cancer Neg Hx   . Colon cancer Neg Hx   . Anesthesia problems Neg Hx   . Hypotension Neg Hx   . Malignant hyperthermia Neg Hx   . Pseudochol deficiency Neg Hx   . Stroke Brother   . Diabetes Brother   . Cancer Brother     bladder with mets  . Diabetes Daughter     borderline  . Hypertension Daughter     History   Social History  . Marital Status: Married    Spouse Name: N/A  . Number of Children: N/A  . Years of Education: N/A   Occupational History  . works partime in office    Social History Main Topics  . Smoking status: Never Smoker   . Smokeless tobacco: Never Used  . Alcohol Use: No  . Drug Use: No  . Sexual Activity: Yes    Birth Control/ Protection: Surgical     Comment: lives with husband, no dietary restrictions   Other Topics Concern   . Not on file   Social History Narrative    Current Outpatient Prescriptions on File Prior to Visit  Medication Sig Dispense Refill  . Blood Glucose Monitoring Suppl (ONE TOUCH ULTRA SYSTEM KIT) W/DEVICE KIT 1 kit by Does not apply route once. DX: 250.00 Check sugars daily as needed 1 each 0  . cholecalciferol (VITAMIN D) 1000 UNITS tablet Take 1,000 Units by mouth daily.    Marland Kitchen glucose blood (ONE TOUCH TEST STRIPS) test strip Check BS daily and prn DX: E11.9 100 each 11  . isosorbide mononitrate (IMDUR) 60 MG 24 hr tablet Take 0.5 tablets (30 mg total) by mouth daily. 45 tablet 2  . losartan-hydrochlorothiazide (HYZAAR) 100-25 MG per tablet TAKE ONE TABLET BY MOUTH ONCE DAILY 90 tablet 3  . magnesium oxide (MAG-OX) 400 MG tablet Take 400 mg by mouth 2 (two) times daily.     . metoprolol succinate (TOPROL-XL) 50 MG 24 hr tablet TAKE ONE TABLET BY MOUTH TWICE DAILY IMMEDIATELY  FOLOWING  A  MEAL 180 tablet 2  . omeprazole (PRILOSEC) 20 MG capsule Take 20 mg by mouth 2 (two) times daily before a meal.    . ONETOUCH DELICA LANCETS FINE MISC Check sugars daily and prn  DX 250.00 100 each 1  . oxyCODONE (OXY IR/ROXICODONE) 5 MG immediate release tablet Take 1-2 tablets (5-10 mg total) by mouth every 3 (three) hours as needed for breakthrough pain. 30 tablet 0  . Oxycodone HCl 10 MG TABS Take 1 tablet (10 mg total) by mouth 2 (two) times daily. 30 tablet 0  . potassium chloride SA (KLOR-CON M20) 20 MEQ tablet Take 1 tablet (20 mEq total) by mouth daily. 90 tablet 2  . pramipexole (MIRAPEX) 0.5 MG tablet Take 1 tablet (0.5 mg total) by mouth 2 (two) times daily. 60 tablet 3  . Rivaroxaban (XARELTO) 15 MG TABS tablet Take 1 tablet (15 mg total) by mouth 2 (two) times daily with a meal. 60 tablet 0  . simvastatin (ZOCOR) 20 MG tablet TAKE ONE TABLET BY MOUTH AT BEDTIME 90 tablet 3  . [DISCONTINUED] nitroGLYCERIN (NITROSTAT) 0.4 MG SL tablet Place 0.4 mg under the tongue every 5 (five) minutes as  needed.       No current facility-administered medications on file  prior to visit.    No Known Allergies  Review of Systems  Review of Systems  Constitutional: Negative for fever and malaise/fatigue.  HENT: Negative for congestion.   Eyes: Negative for discharge.  Respiratory: Negative for shortness of breath.   Cardiovascular: Negative for chest pain, palpitations and leg swelling.  Gastrointestinal: Negative for nausea, abdominal pain and diarrhea.  Genitourinary: Negative for dysuria.  Musculoskeletal: Positive for myalgias, joint pain and neck pain. Negative for falls.  Skin: Negative for rash.  Neurological: Negative for loss of consciousness and headaches.  Endo/Heme/Allergies: Negative for polydipsia.  Psychiatric/Behavioral: Negative for depression and suicidal ideas. The patient is not nervous/anxious and does not have insomnia.     Objective  BP 118/72 mmHg  Pulse 73  Temp(Src) 97.8 F (36.6 C) (Oral)  Ht '5\' 3"'  (1.6 m)  Wt 161 lb 2 oz (73.086 kg)  BMI 28.55 kg/m2  SpO2 94%  Physical Exam  Physical Exam  Constitutional: She is oriented to person, place, and time and well-developed, well-nourished, and in no distress. No distress.  HENT:  Head: Normocephalic and atraumatic.  Eyes: Conjunctivae are normal.  Neck: Neck supple. No thyromegaly present.  Cardiovascular: Normal rate, regular rhythm and normal heart sounds.   No murmur heard. Pulmonary/Chest: Effort normal and breath sounds normal. She has no wheezes.  Abdominal: Soft. Bowel sounds are normal. She exhibits no distension and no mass.  Musculoskeletal: She exhibits no edema.  Right knee scar c/w TKR, well healed  Lymphadenopathy:    She has no cervical adenopathy.  Neurological: She is alert and oriented to person, place, and time.  Skin: Skin is warm and dry. No rash noted. She is not diaphoretic.  Psychiatric: Memory, affect and judgment normal.    Lab Results  Component Value Date   TSH  1.74 12/31/2014   Lab Results  Component Value Date   WBC 6.7 12/31/2014   HGB 11.9* 12/31/2014   HCT 36.5 12/31/2014   MCV 89.7 12/31/2014   PLT 201.0 12/31/2014   Lab Results  Component Value Date   CREATININE 1.12 12/31/2014   BUN 22 12/31/2014   NA 137 12/31/2014   K 3.6 12/31/2014   CL 100 12/31/2014   CO2 31 12/31/2014   Lab Results  Component Value Date   ALT 9 12/31/2014   AST 16 12/31/2014   ALKPHOS 81 12/31/2014   BILITOT 0.7 12/31/2014   Lab Results  Component Value Date   CHOL 131 12/31/2014   Lab Results  Component Value Date   HDL 44.80 12/31/2014   Lab Results  Component Value Date   LDLCALC 59 12/31/2014   Lab Results  Component Value Date   TRIG 138.0 12/31/2014   Lab Results  Component Value Date   CHOLHDL 3 12/31/2014     Assessment & Plan  DM (diabetes mellitus) hgba1c acceptable, minimize simple carbs. Increase exercise as tolerated. Continue current meds  Hyperlipidemia Tolerating statin, encouraged heart healthy diet, avoid trans fats, minimize simple carbs and saturated fats. Increase exercise as tolerated  Essential hypertension Well controlled, no changes to meds. Encouraged heart healthy diet such as the DASH diet and exercise as tolerated.   GERD Avoid offending foods, take probiotics. Do not eat large meals in late evening and consider raising head of bed.   Anemia Increase leafy greens, consider increased lean red meat and using cast iron cookware. Continue to monitor, report any concerns  DJD (degenerative joint disease) of knee Right knee continues to cause pain,  but is improving s/p TKR with Dr Ronnie Derby on 11/29/2014  Neck pain With some right claviclular pain, xray reveals arthritic changes but no acute concerns. Can consider referral for further care if pain worsens. Try topical treatments such as [place] for now  Peroneal DVT (deep venous thrombosis) Tolerating Jennye Moccasin

## 2015-01-16 NOTE — Assessment & Plan Note (Signed)
Well controlled, no changes to meds. Encouraged heart healthy diet such as the DASH diet and exercise as tolerated.  °

## 2015-01-16 NOTE — Assessment & Plan Note (Signed)
Avoid offending foods, take probiotics. Do not eat large meals in late evening and consider raising head of bed.  

## 2015-01-16 NOTE — Assessment & Plan Note (Signed)
hgba1c acceptable, minimize simple carbs. Increase exercise as tolerated. Continue current meds 

## 2015-01-17 ENCOUNTER — Ambulatory Visit (INDEPENDENT_AMBULATORY_CARE_PROVIDER_SITE_OTHER): Payer: Medicare Other | Admitting: Endocrinology

## 2015-01-17 ENCOUNTER — Encounter: Payer: Self-pay | Admitting: Endocrinology

## 2015-01-17 VITALS — BP 134/70 | HR 79 | Temp 97.9°F | Resp 16 | Ht 63.75 in | Wt 157.0 lb

## 2015-01-17 DIAGNOSIS — E042 Nontoxic multinodular goiter: Secondary | ICD-10-CM

## 2015-01-17 DIAGNOSIS — I6523 Occlusion and stenosis of bilateral carotid arteries: Secondary | ICD-10-CM

## 2015-01-17 NOTE — Patient Instructions (Signed)
Please return in 1 year. most of the time, a "lumpy thyroid" will eventually become overactive.  this is usually a slow process, happening over the span of many years. 

## 2015-01-17 NOTE — Progress Notes (Signed)
Subjective:    Patient ID: Heather [name], female    DOB: February 19, 1935, 80 y.o.   MRN: [id number]  HPI Pt's dtr says in [place], pt was incidentally noted (on a carotid US), to have a nodule at the thyroid.  He has no h/o XRT or surgery to the neck.  She has slight pain at area of the right clavicular head, and assoc swelling.  Past Medical History  Diagnosis Date  . Coronary artery disease     post percutaneous coronary intervention in 2006  . Hyperlipidemia   . Restless legs     takes mirapex daily  . Fatigue   . Persistent disorder of initiating or maintaining sleep   . Dermatitis   . S/P inguinal herniorrhaphy   . DJD (degenerative joint disease) of knee 09/18/2011    knees- both, elbow- R, ankles   . Hypertension     takes medications daily  . GERD (gastroesophageal reflux disease)     prilosec daily  . H/O dizziness     pre-syncope/due to dehydration  . Occasional tremors   . Diverticulitis     hx of  . Gastric ulcer     hx of  . Angina   . Atrial fibrillation   . Dysrhythmia, cardiac     "palpitations"  . Borderline diabetes   . H/O hiatal hernia   . Stroke 2000    TIA- series that have resolved-  . Hx of total knee replacement 11/12/11    left knee  . UTI (lower urinary tract infection) 10/04/2013  . Medicare annual wellness visit, subsequent 11/22/2013  . Hip pain 08/28/2011  . Diabetes mellitus without complication     no meds    Past Surgical History  Procedure Laterality Date  . Abdominal hysterectomy    . Knee arthroscopy      2 on left and 1 on right  . Foot surgery      left foot stress fracture repair  . Hernia repair      1989- esophageal hernia   . Upper gastrointestinal endoscopy  2013  . Total knee arthroplasty  11/12/2011    Procedure: TOTAL KNEE ARTHROPLASTY;  Surgeon: Lorn Junes, MD;  Location: [facility];  Service: Orthopedics;  Laterality: Left;  Dr Noemi Chapel wants 90 minutes for this case  . Coronary angioplasty  03/2005; 06/2005    "1" plus  "1"= "2 total"  . Eye surgery  11-16-13    cataract -right eye  . Brain surgery      related to stroke   2000  . Joint replacement    . Total knee arthroplasty Right 11/29/2014    Procedure: RIGHT TOTAL KNEE ARTHROPLASTY;  Surgeon: Vickey Huger, MD;  Location: [facility];  Service: Orthopedics;  Laterality: Right;    History   Social History  . Marital Status: Married    Spouse Name: N/A  . Number of Children: N/A  . Years of Education: N/A   Occupational History  . works partime in office    Social History Main Topics  . Smoking status: Never Smoker   . Smokeless tobacco: Never Used  . Alcohol Use: No  . Drug Use: No  . Sexual Activity: Yes    Birth Control/ Protection: Surgical     Comment: lives with husband, no dietary restrictions   Other Topics Concern  . Not on file   Social History Narrative    Current Outpatient Prescriptions on File Prior to Visit  Medication Sig  Dispense Refill  . amLODipine (NORVASC) 10 MG tablet TAKE 1/2 TABLET EVERY DAY 45 tablet 1  . Blood Glucose Monitoring Suppl (ONE TOUCH ULTRA SYSTEM KIT) W/DEVICE KIT 1 kit by Does not apply route once. DX: 250.00 Check sugars daily as needed 1 each 0  . cholecalciferol (VITAMIN D) 1000 UNITS tablet Take 1,000 Units by mouth daily.    Marland Kitchen glucose blood (ONE TOUCH TEST STRIPS) test strip Check BS daily and prn DX: E11.9 100 each 11  . isosorbide mononitrate (IMDUR) 60 MG 24 hr tablet Take 0.5 tablets (30 mg total) by mouth daily. 45 tablet 2  . losartan-hydrochlorothiazide (HYZAAR) 100-25 MG per tablet TAKE ONE TABLET BY MOUTH ONCE DAILY 90 tablet 3  . magnesium oxide (MAG-OX) 400 MG tablet Take 400 mg by mouth 2 (two) times daily.     . metFORMIN (GLUCOPHAGE) 500 MG tablet TAKE 1 TABLET (500 MG TOTAL) BY MOUTH DAILY WITH BREAKFAST. 90 tablet 1  . metoprolol succinate (TOPROL-XL) 50 MG 24 hr tablet TAKE ONE TABLET BY MOUTH TWICE DAILY IMMEDIATELY  FOLOWING  A  MEAL 180 tablet 2  . omeprazole (PRILOSEC) 20 MG  capsule Take 20 mg by mouth 2 (two) times daily before a meal.    . ONETOUCH DELICA LANCETS FINE MISC Check sugars daily and prn  DX 250.00 100 each 1  . oxyCODONE (OXY IR/ROXICODONE) 5 MG immediate release tablet Take 1-2 tablets (5-10 mg total) by mouth every 3 (three) hours as needed for breakthrough pain. 30 tablet 0  . Oxycodone HCl 10 MG TABS Take 1 tablet (10 mg total) by mouth 2 (two) times daily. 30 tablet 0  . potassium chloride SA (KLOR-CON M20) 20 MEQ tablet Take 1 tablet (20 mEq total) by mouth daily. 90 tablet 2  . pramipexole (MIRAPEX) 0.25 MG tablet TAKE 1 TABLET TWICE DAILY 180 tablet 1  . pramipexole (MIRAPEX) 0.5 MG tablet Take 1 tablet (0.5 mg total) by mouth 2 (two) times daily. 60 tablet 3  . Rivaroxaban (XARELTO) 15 MG TABS tablet Take 1 tablet (15 mg total) by mouth 2 (two) times daily with a meal. 60 tablet 0  . rivaroxaban (XARELTO) 20 MG TABS tablet Take 1 tablet (20 mg total) by mouth daily with supper. 30 tablet 1  . simvastatin (ZOCOR) 20 MG tablet TAKE ONE TABLET BY MOUTH AT BEDTIME 90 tablet 3  . [DISCONTINUED] nitroGLYCERIN (NITROSTAT) 0.4 MG SL tablet Place 0.4 mg under the tongue every 5 (five) minutes as needed.       No current facility-administered medications on file prior to visit.    No Known Allergies  Family History  Problem Relation Age of Onset  . Arthritis      family hx of  . Hypertension      family hx of  . Other      family hx of cardiovascular disorder  . Stroke Father     family hx of M 1st degree relative <50  . Coronary artery disease Mother   . Heart disease Mother   . Depression Brother   . Breast cancer Neg Hx   . Colon cancer Neg Hx   . Anesthesia problems Neg Hx   . Hypotension Neg Hx   . Malignant hyperthermia Neg Hx   . Pseudochol deficiency Neg Hx   . Stroke Brother   . Diabetes Brother   . Cancer Brother     bladder with mets  . Diabetes Daughter     borderline  .  Hypertension Daughter   . Thyroid disease  Daughter     BP 134/70 mmHg  Pulse 79  Temp(Src) 97.9 F (36.6 C) (Oral)  Resp 16  Ht 5' 3.75" (1.619 m)  Wt 157 lb (71.215 kg)  BMI 27.17 kg/m2  SpO2 95%  Review of Systems She has chronic dysphagia, but no sob.      Objective:   Physical Exam VITAL SIGNS:  See vs page GENERAL: no distress eyes: no periorbital swelling, no proptosis NECK: Thyroid is slightly enlarged, with an irregular surface, but I can't feel the LUP nodule.   nodes: No palpable lymphadenopathy at the anterior neck. Skin: not diaphoretic Neuro: moderate tremor of the hands Ext: no edema.   PSYCH: Alert and well-oriented.  Does not appear anxious nor depressed.   Lab Results  Component Value Date   TSH 1.74 12/31/2014    radiol thyroid US (11/22/14): Bilateral thyroid nodules are noted. The largest is predominantly solid nodule measuring 1.7 cm in maximum diameter in the upper pole of left thyroid lobe.    Assessment & Plan:  Multinodular goiter: new to me, uncertain etiology.  due to the size, number, and echogenicity of the nodules, this is a borderline case for bx.  We discussed this, and her advanced age as well as med problems.  She declines bx, at least for now.   Dysphagia: I told pt this is not thyroid-related Fullness at the area of the right clavicular head: again, I told pt this is not thyroid-related.  Patient is advised the following: Patient Instructions  Please return in 1 year.   most of the time, a "lumpy thyroid" will eventually become overactive.  this is usually a slow process, happening over the span of many years.

## 2015-01-18 ENCOUNTER — Encounter: Payer: Self-pay | Admitting: Diagnostic Neuroimaging

## 2015-01-18 ENCOUNTER — Ambulatory Visit (INDEPENDENT_AMBULATORY_CARE_PROVIDER_SITE_OTHER): Payer: Medicare Other | Admitting: Diagnostic Neuroimaging

## 2015-01-18 ENCOUNTER — Telehealth: Payer: Self-pay | Admitting: Family Medicine

## 2015-01-18 ENCOUNTER — Ambulatory Visit: Payer: Medicare Other | Admitting: Physician Assistant

## 2015-01-18 VITALS — BP 151/76 | HR 65 | Ht 63.0 in | Wt 156.0 lb

## 2015-01-18 DIAGNOSIS — Z96651 Presence of right artificial knee joint: Secondary | ICD-10-CM | POA: Diagnosis not present

## 2015-01-18 DIAGNOSIS — M25661 Stiffness of right knee, not elsewhere classified: Secondary | ICD-10-CM | POA: Diagnosis not present

## 2015-01-18 DIAGNOSIS — M542 Cervicalgia: Secondary | ICD-10-CM | POA: Diagnosis not present

## 2015-01-18 DIAGNOSIS — I6523 Occlusion and stenosis of bilateral carotid arteries: Secondary | ICD-10-CM | POA: Diagnosis not present

## 2015-01-18 DIAGNOSIS — R262 Difficulty in walking, not elsewhere classified: Secondary | ICD-10-CM | POA: Diagnosis not present

## 2015-01-18 NOTE — Telephone Encounter (Signed)
Pt not understanding need for appt today at 4:15pm with Einar Pheasant to get MRI on clavical as she saw Dr. [name] for this issue 12/31/14. Per Shirlean Mylar notes from 12/31/14 indicate pt would call back if she decided to have MRI. Robin checking with Dr. [name]

## 2015-01-18 NOTE — Telephone Encounter (Signed)
Talked to pt and she did not want to keep the appt today with [facility]. Cancelled since Dr. [name] stated pt did not need to keep. Pt states that she just came out of the neurologist office and he stated that pt does not need to have an MRI for clavical pain so not to worry about entering a referral for that.

## 2015-01-18 NOTE — Patient Instructions (Signed)
Consider physical and occupational therapy for right neck, clavicle, shoulder pain.

## 2015-01-18 NOTE — Progress Notes (Signed)
[facility]  PATIENT: Heather [name] DOB: October 19, 1934  REFERRING CLINICIAN: Charlett [name] HISTORY FROM: patient and daughter REASON FOR VISIT: new consult    HISTORICAL  CHIEF COMPLAINT:  Chief Complaint  Patient presents with  . Neck Pain    rm 7, former Dr Heather [name] patient, daughter - Heather [name]    HISTORY OF PRESENT ILLNESS:   80 year old female here for evaluation of neck pain. Symptoms progressive worsening over past few months. She had x-rays of the neck showing degenerative disc disease. Patient has been trying moist heating pad, and over-the-counter medications without relief. No cigarette numbness or tingling in her hands. She's not interested in surgical evaluation. She's not tried physical therapy at. No problems with her legs other than a DVT in the right leg which she is getting treatment for.  Patient previously evaluated by Dr. Erling [name] for TIA in 2000's.    REVIEW OF SYSTEMS: Full 14 system review of systems performed and notable only for palpitations hearing loss trouble swallowing snoring joint pain joint joint cramps tremor difficult swallowing insomnia sleepiness restless legs.  ALLERGIES: No Known Allergies  HOME MEDICATIONS: Outpatient Prescriptions Prior to Visit  Medication Sig Dispense Refill  . amLODipine (NORVASC) 10 MG tablet TAKE 1/2 TABLET EVERY DAY 45 tablet 1  . Blood Glucose Monitoring Suppl (ONE TOUCH ULTRA SYSTEM KIT) W/DEVICE KIT 1 kit by Does not apply route once. DX: 250.00 Check sugars daily as needed 1 each 0  . cholecalciferol (VITAMIN D) 1000 UNITS tablet Take 1,000 Units by mouth daily.    Marland Kitchen glucose blood (ONE TOUCH TEST STRIPS) test strip Check BS daily and prn DX: E11.9 100 each 11  . isosorbide mononitrate (IMDUR) 60 MG 24 hr tablet Take 0.5 tablets (30 mg total) by mouth daily. 45 tablet 2  . losartan-hydrochlorothiazide (HYZAAR) 100-25 MG per tablet TAKE ONE TABLET BY MOUTH ONCE DAILY 90 tablet 3  . magnesium oxide (MAG-OX) 400  MG tablet Take 400 mg by mouth 2 (two) times daily.     . metFORMIN (GLUCOPHAGE) 500 MG tablet TAKE 1 TABLET (500 MG TOTAL) BY MOUTH DAILY WITH BREAKFAST. 90 tablet 1  . metoprolol succinate (TOPROL-XL) 50 MG 24 hr tablet TAKE ONE TABLET BY MOUTH TWICE DAILY IMMEDIATELY  FOLOWING  A  MEAL 180 tablet 2  . omeprazole (PRILOSEC) 20 MG capsule Take 20 mg by mouth 2 (two) times daily before a meal.    . ONETOUCH DELICA LANCETS FINE MISC Check sugars daily and prn  DX 250.00 100 each 1  . potassium chloride SA (KLOR-CON M20) 20 MEQ tablet Take 1 tablet (20 mEq total) by mouth daily. 90 tablet 2  . pramipexole (MIRAPEX) 0.25 MG tablet TAKE 1 TABLET TWICE DAILY 180 tablet 1  . rivaroxaban (XARELTO) 20 MG TABS tablet Take 1 tablet (20 mg total) by mouth daily with supper. 30 tablet 1  . simvastatin (ZOCOR) 20 MG tablet TAKE ONE TABLET BY MOUTH AT BEDTIME 90 tablet 3  . ciprofloxacin (CIPRO) 750 MG tablet Take 750 mg by mouth 2 (two) times daily.  0  . oxyCODONE (OXY IR/ROXICODONE) 5 MG immediate release tablet Take 1-2 tablets (5-10 mg total) by mouth every 3 (three) hours as needed for breakthrough pain. 30 tablet 0  . Oxycodone HCl 10 MG TABS Take 1 tablet (10 mg total) by mouth 2 (two) times daily. 30 tablet 0  . pramipexole (MIRAPEX) 0.5 MG tablet Take 1 tablet (0.5 mg total) by mouth 2 (two) times daily. Heather [name]  tablet 3  . Rivaroxaban (XARELTO) 15 MG TABS tablet Take 1 tablet (15 mg total) by mouth 2 (two) times daily with a meal. 60 tablet 0   No facility-administered medications prior to visit.    PAST MEDICAL HISTORY: Past Medical History  Diagnosis Date  . Coronary artery disease     post percutaneous coronary intervention in 2006  . Hyperlipidemia   . Restless legs     takes mirapex daily  . Fatigue   . Persistent disorder of initiating or maintaining sleep   . Dermatitis   . S/P inguinal herniorrhaphy   . DJD (degenerative joint disease) of knee 09/18/2011    knees- both, elbow- R,  ankles   . Hypertension     takes medications daily  . GERD (gastroesophageal reflux disease)     prilosec daily  . H/O dizziness     pre-syncope/due to dehydration  . Occasional tremors   . Diverticulitis     hx of  . Gastric ulcer     hx of  . Angina   . Atrial fibrillation   . Dysrhythmia, cardiac     "palpitations"  . Borderline diabetes   . H/O hiatal hernia   . Stroke 2000    TIA- series that have resolved-  . Hx of total knee replacement 11/12/11    left knee  . UTI (lower urinary tract infection) 10/04/2013  . Medicare annual wellness visit, subsequent 11/22/2013  . Hip pain 08/28/2011  . Diabetes mellitus without complication     no meds    PAST SURGICAL HISTORY: Past Surgical History  Procedure Laterality Date  . Abdominal hysterectomy    . Knee arthroscopy      2 on left and 1 on right  . Foot surgery      left foot stress fracture repair  . Hernia repair      1989- esophageal hernia   . Upper gastrointestinal endoscopy  2013  . Total knee arthroplasty  11/12/2011    Procedure: TOTAL KNEE ARTHROPLASTY;  Surgeon: Heather Junes, MD;  Location: [facility];  Service: Orthopedics;  Laterality: Left;  Dr Heather [name] wants 90 minutes for this case  . Coronary angioplasty  03/2005; 06/2005    "1" plus "1"= "2 total"  . Eye surgery  11-16-13    cataract -right eye  . Brain surgery      related to stroke   2000  . Joint replacement      left  . Total knee arthroplasty Right 11/29/2014    Procedure: RIGHT TOTAL KNEE ARTHROPLASTY;  Surgeon: Heather Huger, MD;  Location: [facility];  Service: Orthopedics;  Laterality: Right;    FAMILY HISTORY: Family History  Problem Relation Age of Onset  . Arthritis      family hx of  . Hypertension      family hx of  . Other      family hx of cardiovascular disorder  . Stroke Father     family hx of M 1st degree relative <50  . Coronary artery disease Mother   . Heart disease Mother   . Depression Brother   . Breast cancer Neg Hx   .  Colon cancer Neg Hx   . Anesthesia problems Neg Hx   . Hypotension Neg Hx   . Malignant hyperthermia Neg Hx   . Pseudochol deficiency Neg Hx   . Stroke Brother   . Diabetes Brother   . Cancer Brother     bladder with mets  .  Diabetes Daughter     borderline  . Hypertension Daughter   . Thyroid disease Daughter     SOCIAL HISTORY:  History   Social History  . Marital Status: Married    Spouse Name: N/A  . Number of Children: 3  . Years of Education: 12   Occupational History  . works partime in office     retired   Social History Main Topics  . Smoking status: Never Smoker   . Smokeless tobacco: Never Used  . Alcohol Use: No  . Drug Use: No  . Sexual Activity: Yes    Birth Control/ Protection: Surgical     Comment: lives with husband, no dietary restrictions   Other Topics Concern  . Not on file   Social History Narrative   Lives with husband   Caffeine use- none     PHYSICAL EXAM  GENERAL EXAM/CONSTITUTIONAL: Vitals:  Filed Vitals:   01/18/15 1247  BP: 151/76  Pulse: 65  Height: _0  (1.6 m)  Weight: 156 lb (70.761 kg)     Body mass index is 27.64 kg/(m^2).  Visual Acuity Screening   Right eye Left eye Both eyes  Without correction:     With correction: 20/50 20/40      Patient is in no distress; well developed, nourished and groomed; NECK HAS DECR ROM IN ALL DIRECTIONS; DECR ROM IN RIGHT SHOULDER; RIGHT STERNOCLAVICULAR JOINT SLIGHTLY MORE PROMINENT AND TENDER THAN LEFT.  CARDIOVASCULAR:  Examination of carotid arteries is normal; no carotid bruits  Regular rate and rhythm, no murmurs  Examination of peripheral vascular system by observation and palpation is normal  EYES:  Ophthalmoscopic exam of optic discs and posterior segments is normal; no papilledema or hemorrhages  MUSCULOSKELETAL:  Gait, strength, tone, movements noted in Neurologic exam below  NEUROLOGIC: MENTAL STATUS:  No flowsheet data found.  awake, alert,  oriented to person, place and time  recent and remote memory intact  normal attention and concentration  language fluent, comprehension intact, naming intact,   fund of knowledge appropriate  CRANIAL NERVE:   2nd - no papilledema on fundoscopic exam  2nd, 3rd, 4th, 6th - pupils equal and reactive to light, visual fields full to confrontation, extraocular muscles intact, no nystagmus  5th - facial sensation symmetric  7th - facial strength symmetric  8th - hearing intact  9th - palate elevates symmetrically, uvula midline  11th - shoulder shrug symmetric  12th - tongue protrusion midline  MOTOR:   POSTURAL AND ACTION TREMOR IN BUE; otherwise normal bulk and tone, full strength in the BUE, BLE  SENSORY:   normal and symmetric to light touch; DECR PP AND TEMP IN BUE; vibration normal  COORDINATION:   finger-nose-finger, fine finger movements NOTABLE FOR INTENTION TREMOR IN BUE  REFLEXES:   deep tendon reflexes; BUE 2, RIGHT KNEE TENDER (1-2), LEFT KNEE 3, ANKLES TRACE  GAIT/STATION:   UNSTEADY, LIMPING GAIT (RECENT RIGHT KNEE REPLACEMENT SURG IN MAY 2016 WITH RESIDUAL PAIN/SORENESS)    DIAGNOSTIC DATA (LABS, IMAGING, TESTING) - I reviewed patient records, labs, notes, testing and imaging myself where available.  Lab Results  Component Value Date   WBC 6.7 12/31/2014   HGB 11.9* 12/31/2014   HCT 36.5 12/31/2014   MCV 89.7 12/31/2014   PLT 201.0 12/31/2014      Component Value Date/Time   NA 137 12/31/2014 0843   K 3.6 12/31/2014 0843   CL 100 12/31/2014 0843   CO2 31 12/31/2014 0843   GLUCOSE  159* 12/31/2014 0843   BUN 22 12/31/2014 0843   CREATININE 1.12 12/31/2014 0843   CREATININE 0.95 11/10/2013 0328   CALCIUM 9.5 12/31/2014 0843   PROT 6.5 12/31/2014 0843   ALBUMIN 3.9 12/31/2014 0843   AST 16 12/31/2014 0843   ALT 9 12/31/2014 0843   ALKPHOS 81 12/31/2014 0843   BILITOT 0.7 12/31/2014 0843   GFRNONAA >60 11/30/2014 0532   GFRAA >60  11/30/2014 0532   Lab Results  Component Value Date   CHOL 131 12/31/2014   HDL 44.80 12/31/2014   LDLCALC 59 12/31/2014   LDLDIRECT 73.4 05/20/2014   TRIG 138.0 12/31/2014   CHOLHDL 3 12/31/2014   Lab Results  Component Value Date   HGBA1C 6.4 12/31/2014   No results found for: [id number] Lab Results  Component Value Date   TSH 1.74 12/31/2014    10/29/14 Cervical spine xrays  1. Severe diffuse cervical spine degenerative change. Degenerative changes most prominent at C5-C6 and C6-C7. 2. Carotid vascular disease.   ASSESSMENT AND PLAN  80 y.o. year old female here with progressive neck pain, degenerative changes and cervical spine x-ray, most likely representing degenerative spine disease. Advised conservative therapy. We'll try physical and occupational therapy. Patient not interested in MRI or cervical spine surgery evaluation.   PLAN: - PT/OT evaluation  Return if symptoms worsen or fail to improve.    Penni Bombard, MD 5/73/2202, 5:42 PM Certified in Neurology, Neurophysiology and Neuroimaging  [facility] [address] [place], [place] [address] [phone]

## 2015-01-18 NOTE — Telephone Encounter (Signed)
I did speak to Dr. [name] and she will go ahead and schedule the MRI off of xray results.  Ok to not schedule appt.  Just let the patient know insurance may not approve MRI and request an OV with examination before approving.  Will have to wait and see what they do.

## 2015-01-27 DIAGNOSIS — M25661 Stiffness of right knee, not elsewhere classified: Secondary | ICD-10-CM | POA: Diagnosis not present

## 2015-01-27 DIAGNOSIS — R262 Difficulty in walking, not elsewhere classified: Secondary | ICD-10-CM | POA: Diagnosis not present

## 2015-01-27 DIAGNOSIS — Z96651 Presence of right artificial knee joint: Secondary | ICD-10-CM | POA: Diagnosis not present

## 2015-02-08 DIAGNOSIS — R300 Dysuria: Secondary | ICD-10-CM | POA: Diagnosis not present

## 2015-02-09 DIAGNOSIS — M25661 Stiffness of right knee, not elsewhere classified: Secondary | ICD-10-CM | POA: Diagnosis not present

## 2015-02-09 DIAGNOSIS — R262 Difficulty in walking, not elsewhere classified: Secondary | ICD-10-CM | POA: Diagnosis not present

## 2015-02-09 DIAGNOSIS — Z96651 Presence of right artificial knee joint: Secondary | ICD-10-CM | POA: Diagnosis not present

## 2015-02-10 DIAGNOSIS — S8391XA Sprain of unspecified site of right knee, initial encounter: Secondary | ICD-10-CM | POA: Diagnosis not present

## 2015-02-10 DIAGNOSIS — M25561 Pain in right knee: Secondary | ICD-10-CM | POA: Diagnosis not present

## 2015-02-27 ENCOUNTER — Other Ambulatory Visit: Payer: Self-pay | Admitting: Family Medicine

## 2015-03-01 DIAGNOSIS — Z961 Presence of intraocular lens: Secondary | ICD-10-CM | POA: Diagnosis not present

## 2015-03-01 DIAGNOSIS — H5203 Hypermetropia, bilateral: Secondary | ICD-10-CM | POA: Diagnosis not present

## 2015-03-01 DIAGNOSIS — I10 Essential (primary) hypertension: Secondary | ICD-10-CM | POA: Diagnosis not present

## 2015-03-01 DIAGNOSIS — H35033 Hypertensive retinopathy, bilateral: Secondary | ICD-10-CM | POA: Diagnosis not present

## 2015-03-01 DIAGNOSIS — H52223 Regular astigmatism, bilateral: Secondary | ICD-10-CM | POA: Diagnosis not present

## 2015-03-01 DIAGNOSIS — H59091 Other disorders of the right eye following cataract surgery: Secondary | ICD-10-CM | POA: Diagnosis not present

## 2015-03-01 DIAGNOSIS — H59092 Other disorders of the left eye following cataract surgery: Secondary | ICD-10-CM | POA: Diagnosis not present

## 2015-03-01 DIAGNOSIS — H43812 Vitreous degeneration, left eye: Secondary | ICD-10-CM | POA: Diagnosis not present

## 2015-03-01 DIAGNOSIS — H524 Presbyopia: Secondary | ICD-10-CM | POA: Diagnosis not present

## 2015-03-01 DIAGNOSIS — H43811 Vitreous degeneration, right eye: Secondary | ICD-10-CM | POA: Diagnosis not present

## 2015-03-03 ENCOUNTER — Telehealth: Payer: Self-pay | Admitting: Family Medicine

## 2015-03-03 DIAGNOSIS — M25661 Stiffness of right knee, not elsewhere classified: Secondary | ICD-10-CM | POA: Diagnosis not present

## 2015-03-03 DIAGNOSIS — R262 Difficulty in walking, not elsewhere classified: Secondary | ICD-10-CM | POA: Diagnosis not present

## 2015-03-03 DIAGNOSIS — Z96651 Presence of right artificial knee joint: Secondary | ICD-10-CM | POA: Diagnosis not present

## 2015-03-03 NOTE — Telephone Encounter (Signed)
Usually the free 30 day coupons are good, OK to provide her with coupon and 30 day rx. This will only cover one month. Will not be able to use coupon again

## 2015-03-03 NOTE — Telephone Encounter (Signed)
Caller name: Jimena Wieczorek  Relationship to patient: Self  Can be reached: [phone] Pharmacy:  Reason for call: pt would like to know if you have another coupon or a supply of Xarelto. She says that she went to get a refill and was told that the medication would cost 200.00. Pt says that she cant afford to pay for it. Please call back to advise pt.

## 2015-03-03 NOTE — Telephone Encounter (Signed)
Advise please.  I found a coupon for a 30 day trial offer, but not sure if patient is elligible?

## 2015-03-04 MED ORDER — RIVAROXABAN 20 MG PO TABS: 20.0000 mg | ORAL_TABLET | Freq: Every day | ORAL | Status: DC

## 2015-03-04 NOTE — Telephone Encounter (Signed)
Do we have samples? We often have sample but not always. If we have sample please give her 4-6 weeks worth. If not we can switch to coumadin but that requires monitoring.

## 2015-03-04 NOTE — Telephone Encounter (Signed)
Location manager for signature.  Called the patient left a message to call back

## 2015-03-04 NOTE — Telephone Encounter (Signed)
Called the patient to inform have 2 different coupons, plus prescription.  She did state she has already used one #30 day free coupon, but the refill was going to cost over $200.  She did state that Elyn Aquas mentioned to her when she went on the xarelto to not worry as we always have samples.  So please advise on what to do as not sure these coupons will work for her or samples??

## 2015-03-04 NOTE — Telephone Encounter (Signed)
Patient given 3 boxes #5 each box Xarelto 20 mg.  Patient will pickup at the front along with script and coupons.

## 2015-03-18 ENCOUNTER — Other Ambulatory Visit (INDEPENDENT_AMBULATORY_CARE_PROVIDER_SITE_OTHER): Payer: Medicare Other

## 2015-03-18 ENCOUNTER — Other Ambulatory Visit: Payer: Self-pay | Admitting: Family Medicine

## 2015-03-18 DIAGNOSIS — R350 Frequency of micturition: Secondary | ICD-10-CM

## 2015-03-18 LAB — URINALYSIS, ROUTINE W REFLEX MICROSCOPIC
Bilirubin Urine: NEGATIVE
HGB URINE DIPSTICK: NEGATIVE
KETONES UR: NEGATIVE
Nitrite: NEGATIVE
Specific Gravity, Urine: 1.01 (ref 1.000–1.030)
Total Protein, Urine: NEGATIVE
URINE GLUCOSE: NEGATIVE
UROBILINOGEN UA: 0.2 (ref 0.0–1.0)
pH: 6.5 (ref 5.0–8.0)

## 2015-03-21 LAB — URINE CULTURE: Colony Count: 50000

## 2015-03-22 ENCOUNTER — Telehealth: Payer: Self-pay | Admitting: Family Medicine

## 2015-03-22 ENCOUNTER — Other Ambulatory Visit: Payer: Self-pay

## 2015-03-22 ENCOUNTER — Encounter: Payer: Self-pay | Admitting: Family Medicine

## 2015-03-22 NOTE — Telephone Encounter (Signed)
Pt called to check on meds Per lab notes pt to start Cipro. No RX sent in that I see in her chart. [facility] to [facility] [address] [place].

## 2015-03-23 ENCOUNTER — Telehealth: Payer: Self-pay

## 2015-03-23 MED ORDER — CIPROFLOXACIN HCL 250 MG PO TABS: 250.0000 mg | ORAL_TABLET | Freq: Two times a day (BID) | ORAL | Status: DC

## 2015-03-23 NOTE — Telephone Encounter (Signed)
Relation to QV:LDKC Call back number: [address], [place], [place] [address] Phone: [phone]    Reason for call:  Patient would like to know the status of medication below, patient states she is exper. Diarrhea and does not feel well.

## 2015-03-23 NOTE — Telephone Encounter (Signed)
Spoke with [facility], states that medication should be 250mg . Advised patient that new Rx has been sent to requested pharmacy. [facility]  Patient states that she started with loose stools since yesterday and has a headache. Reports 30 minutes post meal and 1 additional episode.  Please advise.

## 2015-03-23 NOTE — Telephone Encounter (Signed)
Pt saw results on mychart rx already sent

## 2015-03-23 NOTE — Telephone Encounter (Signed)
Pt saw results on MyChart. Ciprofloxacin 20 mg tabs 1 tab po bid x 5 day was prescribed this is not a choice please advise on what dose you would like. Thank you

## 2015-03-23 NOTE — Telephone Encounter (Signed)
Please check if patient is feeling better, if still having loose stool then rest gut for 24 hours drink clear fluids then progress to [employer] (bananas, rice, applesauce and toast)

## 2015-03-25 NOTE — Telephone Encounter (Signed)
Pt states still feeling weak and having loose stools. Pt states her diet has been very light and following the [employer]. Informed if symptoms get worse consider an urgent care or ER and to call if there is anything we can help her with.

## 2015-03-25 NOTE — Telephone Encounter (Signed)
LMOVM

## 2015-03-31 ENCOUNTER — Ambulatory Visit: Payer: Medicare Other | Admitting: Family Medicine

## 2015-04-05 ENCOUNTER — Encounter: Payer: Self-pay | Admitting: Family Medicine

## 2015-04-05 ENCOUNTER — Ambulatory Visit (INDEPENDENT_AMBULATORY_CARE_PROVIDER_SITE_OTHER): Payer: Medicare Other | Admitting: Family Medicine

## 2015-04-05 VITALS — BP 140/70 | HR 64 | Temp 97.7°F | Ht 63.0 in | Wt 154.4 lb

## 2015-04-05 DIAGNOSIS — G44009 Cluster headache syndrome, unspecified, not intractable: Secondary | ICD-10-CM

## 2015-04-05 DIAGNOSIS — E118 Type 2 diabetes mellitus with unspecified complications: Secondary | ICD-10-CM

## 2015-04-05 DIAGNOSIS — I10 Essential (primary) hypertension: Secondary | ICD-10-CM

## 2015-04-05 DIAGNOSIS — R630 Anorexia: Secondary | ICD-10-CM

## 2015-04-05 DIAGNOSIS — Z23 Encounter for immunization: Secondary | ICD-10-CM

## 2015-04-05 DIAGNOSIS — D649 Anemia, unspecified: Secondary | ICD-10-CM

## 2015-04-05 DIAGNOSIS — I82451 Acute embolism and thrombosis of right peroneal vein: Secondary | ICD-10-CM

## 2015-04-05 DIAGNOSIS — R519 Headache, unspecified: Secondary | ICD-10-CM

## 2015-04-05 DIAGNOSIS — Z86718 Personal history of other venous thrombosis and embolism: Secondary | ICD-10-CM | POA: Diagnosis not present

## 2015-04-05 DIAGNOSIS — M542 Cervicalgia: Secondary | ICD-10-CM

## 2015-04-05 DIAGNOSIS — E785 Hyperlipidemia, unspecified: Secondary | ICD-10-CM | POA: Diagnosis not present

## 2015-04-05 DIAGNOSIS — E876 Hypokalemia: Secondary | ICD-10-CM | POA: Diagnosis not present

## 2015-04-05 DIAGNOSIS — I82491 Acute embolism and thrombosis of other specified deep vein of right lower extremity: Secondary | ICD-10-CM | POA: Diagnosis not present

## 2015-04-05 DIAGNOSIS — R197 Diarrhea, unspecified: Secondary | ICD-10-CM | POA: Diagnosis not present

## 2015-04-05 DIAGNOSIS — R51 Headache: Secondary | ICD-10-CM

## 2015-04-05 DIAGNOSIS — I6523 Occlusion and stenosis of bilateral carotid arteries: Secondary | ICD-10-CM | POA: Diagnosis not present

## 2015-04-05 DIAGNOSIS — N390 Urinary tract infection, site not specified: Secondary | ICD-10-CM

## 2015-04-05 DIAGNOSIS — M25519 Pain in unspecified shoulder: Secondary | ICD-10-CM

## 2015-04-05 DIAGNOSIS — F418 Other specified anxiety disorders: Secondary | ICD-10-CM

## 2015-04-05 LAB — H. PYLORI ANTIBODY, IGG: H Pylori IgG: NEGATIVE

## 2015-04-05 LAB — COMPREHENSIVE METABOLIC PANEL
ALK PHOS: 88 U/L (ref 39–117)
ALT: 15 U/L (ref 0–35)
AST: 18 U/L (ref 0–37)
Albumin: 4.4 g/dL (ref 3.5–5.2)
BUN: 17 mg/dL (ref 6–23)
CHLORIDE: 100 meq/L (ref 96–112)
CO2: 30 mEq/L (ref 19–32)
Calcium: 9.8 mg/dL (ref 8.4–10.5)
Creatinine, Ser: 0.84 mg/dL (ref 0.40–1.20)
GFR: 69.61 mL/min (ref >60.00)
Glucose, Bld: 122 mg/dL — ABNORMAL HIGH (ref 70–99)
Potassium: 3.8 mEq/L (ref 3.5–5.1)
Sodium: 138 mEq/L (ref 135–145)
TOTAL PROTEIN: 6.8 g/dL (ref 6.0–8.3)
Total Bilirubin: 0.6 mg/dL (ref 0.2–1.2)

## 2015-04-05 LAB — LIPID PANEL
CHOL/HDL RATIO: 3
CHOLESTEROL: 151 mg/dL (ref 0–200)
HDL: 59.9 mg/dL (ref >39.00)
LDL CALC: 60 mg/dL (ref 0–99)
NonHDL: 90.87
TRIGLYCERIDES: 153 mg/dL — AB (ref 0.0–149.0)
VLDL: 30.6 mg/dL (ref 0.0–40.0)

## 2015-04-05 LAB — URINALYSIS
Bilirubin Urine: NEGATIVE
HGB URINE DIPSTICK: NEGATIVE
Ketones, ur: NEGATIVE
Leukocytes, UA: NEGATIVE
NITRITE: NEGATIVE
SPECIFIC GRAVITY, URINE: 1.01 (ref 1.000–1.030)
TOTAL PROTEIN, URINE-UPE24: NEGATIVE
Urine Glucose: NEGATIVE
Urobilinogen, UA: 0.2 (ref 0.0–1.0)
pH: 6.5 (ref 5.0–8.0)

## 2015-04-05 LAB — CBC
HEMATOCRIT: 40.8 % (ref 36.0–46.0)
HEMOGLOBIN: 13.5 g/dL (ref 12.0–15.0)
MCHC: 33.2 g/dL (ref 30.0–36.0)
MCV: 87 fl (ref 78.0–100.0)
Platelets: 185 10*3/uL (ref 150.0–400.0)
RBC: 4.69 Mil/uL (ref 3.87–5.11)
RDW: 14.9 % (ref 11.5–15.5)
WBC: 7 10*3/uL (ref 4.0–10.5)

## 2015-04-05 LAB — HEMOGLOBIN A1C: Hgb A1c MFr Bld: 6.8 % — ABNORMAL HIGH (ref 4.6–6.5)

## 2015-04-05 LAB — TSH: TSH: 1.29 u[IU]/mL (ref 0.35–4.50)

## 2015-04-05 LAB — SEDIMENTATION RATE: SED RATE: 9 mm/h (ref 0–22)

## 2015-04-05 MED ORDER — ESCITALOPRAM OXALATE 10 MG PO TABS: 10.0000 mg | ORAL_TABLET | Freq: Every day | ORAL | Status: DC

## 2015-04-05 MED ORDER — BACLOFEN 10 MG PO TABS: 10.0000 mg | ORAL_TABLET | Freq: Every evening | ORAL | Status: DC | PRN

## 2015-04-05 NOTE — Progress Notes (Deleted)
Patient ID: Heather [name], female   DOB: 07-31-1934, 80 y.o.   MRN: [id number]   Subjective:    Patient ID: Heather [name], female    DOB: 12/26/1934, 80 y.o.   MRN: [id number]  Chief Complaint  Patient presents with  . Shoulder Pain  . Headache    HPI Patient is in today for ***  Past Medical History  Diagnosis Date  . Coronary artery disease     post percutaneous coronary intervention in 2006  . Hyperlipidemia   . Restless legs     takes mirapex daily  . Fatigue   . Persistent disorder of initiating or maintaining sleep   . Dermatitis   . S/P inguinal herniorrhaphy   . DJD (degenerative joint disease) of knee 09/18/2011    knees- both, elbow- R, ankles   . Hypertension     takes medications daily  . GERD (gastroesophageal reflux disease)     prilosec daily  . H/O dizziness     pre-syncope/due to dehydration  . Occasional tremors   . Diverticulitis     hx of  . Gastric ulcer     hx of  . Angina   . Atrial fibrillation   . Dysrhythmia, cardiac     "palpitations"  . Borderline diabetes   . H/O hiatal hernia   . Stroke 2000    TIA- series that have resolved-  . Hx of total knee replacement 11/12/11    left knee  . UTI (lower urinary tract infection) 10/04/2013  . Medicare annual wellness visit, subsequent 11/22/2013  . Hip pain 08/28/2011  . Diabetes mellitus without complication     no meds    Past Surgical History  Procedure Laterality Date  . Abdominal hysterectomy    . Knee arthroscopy      2 on left and 1 on right  . Foot surgery      left foot stress fracture repair  . Hernia repair      1989- esophageal hernia   . Upper gastrointestinal endoscopy  2013  . Total knee arthroplasty  11/12/2011    Procedure: TOTAL KNEE ARTHROPLASTY;  Surgeon: Lorn Junes, MD;  Location: [facility];  Service: Orthopedics;  Laterality: Left;  Dr Noemi Chapel wants 90 minutes for this case  . Coronary angioplasty  03/2005; 06/2005    "1" plus "1"= "2 total"  . Eye surgery  11-16-13    cataract -right eye  . Brain surgery      related to stroke   2000  . Joint replacement      left  . Total knee arthroplasty Right 11/29/2014    Procedure: RIGHT TOTAL KNEE ARTHROPLASTY;  Surgeon: Vickey Huger, MD;  Location: [facility];  Service: Orthopedics;  Laterality: Right;    Family History  Problem Relation Age of Onset  . Arthritis      family hx of  . Hypertension      family hx of  . Other      family hx of cardiovascular disorder  . Stroke Father     family hx of M 1st degree relative <50  . Coronary artery disease Mother   . Heart disease Mother   . Depression Brother   . Breast cancer Neg Hx   . Colon cancer Neg Hx   . Anesthesia problems Neg Hx   . Hypotension Neg Hx   . Malignant hyperthermia Neg Hx   . Pseudochol deficiency Neg Hx   . Stroke Brother   .  Diabetes Brother   . Cancer Brother     bladder with mets  . Diabetes Daughter     borderline  . Hypertension Daughter   . Thyroid disease Daughter     Social History   Social History  . Marital Status: Married    Spouse Name: N/A  . Number of Children: 3  . Years of Education: 12   Occupational History  . works partime in office     retired   Social History Main Topics  . Smoking status: Never Smoker   . Smokeless tobacco: Never Used  . Alcohol Use: No  . Drug Use: No  . Sexual Activity: Yes    Birth Control/ Protection: Surgical     Comment: lives with husband, no dietary restrictions   Other Topics Concern  . Not on file   Social History Narrative   Lives with husband   Caffeine use- none    Outpatient Prescriptions Prior to Visit  Medication Sig Dispense Refill  . amLODipine (NORVASC) 10 MG tablet TAKE 1/2 TABLET EVERY DAY 45 tablet 1  . Blood Glucose Monitoring Suppl (ONE TOUCH ULTRA SYSTEM KIT) W/DEVICE KIT 1 kit by Does not apply route once. DX: 250.00 Check sugars daily as needed 1 each 0  . cholecalciferol (VITAMIN D) 1000 UNITS tablet Take 1,000 Units by mouth daily.    Marland Kitchen  glucose blood (ONE TOUCH TEST STRIPS) test strip Check BS daily and prn DX: E11.9 100 each 11  . isosorbide mononitrate (IMDUR) 60 MG 24 hr tablet Take 0.5 tablets (30 mg total) by mouth daily. 45 tablet 2  . losartan-hydrochlorothiazide (HYZAAR) 100-25 MG per tablet TAKE ONE TABLET BY MOUTH ONCE DAILY 90 tablet 3  . magnesium oxide (MAG-OX) 400 MG tablet Take 400 mg by mouth 2 (two) times daily.     . metFORMIN (GLUCOPHAGE) 500 MG tablet TAKE 1 TABLET (500 MG TOTAL) BY MOUTH DAILY WITH BREAKFAST. 90 tablet 1  . metoprolol succinate (TOPROL-XL) 50 MG 24 hr tablet TAKE ONE TABLET BY MOUTH TWICE DAILY IMMEDIATELY  FOLOWING  A  MEAL 180 tablet 2  . omeprazole (PRILOSEC) 20 MG capsule Take 20 mg by mouth 2 (two) times daily before a meal.    . ONETOUCH DELICA LANCETS FINE MISC Check sugars daily and prn  DX 250.00 100 each 1  . potassium chloride SA (KLOR-CON M20) 20 MEQ tablet Take 1 tablet (20 mEq total) by mouth daily. 90 tablet 2  . pramipexole (MIRAPEX) 0.25 MG tablet TAKE 1 TABLET TWICE DAILY 180 tablet 1  . rivaroxaban (XARELTO) 20 MG TABS tablet Take 1 tablet (20 mg total) by mouth daily with supper. 30 tablet 1  . simvastatin (ZOCOR) 20 MG tablet TAKE ONE TABLET BY MOUTH AT BEDTIME 90 tablet 3  . aspirin 81 MG tablet Take 81 mg by mouth daily.    . ciprofloxacin (CIPRO) 250 MG tablet Take 1 tablet (250 mg total) by mouth 2 (two) times daily. 10 tablet 0   No facility-administered medications prior to visit.    No Known Allergies  Review of Systems  Constitutional: Negative for fever and malaise/fatigue.  HENT: Negative for congestion.   Eyes: Negative for discharge.  Respiratory: Negative for shortness of breath.   Cardiovascular: Negative for chest pain, palpitations and leg swelling.  Gastrointestinal: Negative for nausea and abdominal pain.  Genitourinary: Negative for dysuria.  Musculoskeletal: Negative for falls.  Skin: Negative for rash.  Neurological: Negative for loss of  consciousness and headaches.  Endo/Heme/Allergies: Negative for environmental allergies.  Psychiatric/Behavioral: Negative for depression. The patient is not nervous/anxious.        Objective:    Physical Exam  BP 154/72 mmHg  Pulse 64  Temp(Src) 97.7 F (36.5 C) (Oral)  Ht '5\' 3"'  (1.6 m)  Wt 154 lb 6 oz (70.024 kg)  BMI 27.35 kg/m2  SpO2 99% Wt Readings from Last 3 Encounters:  04/05/15 154 lb 6 oz (70.024 kg)  01/18/15 156 lb (70.761 kg)  01/17/15 157 lb (71.215 kg)     Lab Results  Component Value Date   WBC 6.7 12/31/2014   HGB 11.9* 12/31/2014   HCT 36.5 12/31/2014   PLT 201.0 12/31/2014   GLUCOSE 159* 12/31/2014   CHOL 131 12/31/2014   TRIG 138.0 12/31/2014   HDL 44.80 12/31/2014   LDLDIRECT 73.4 05/20/2014   LDLCALC 59 12/31/2014   ALT 9 12/31/2014   AST 16 12/31/2014   NA 137 12/31/2014   K 3.6 12/31/2014   CL 100 12/31/2014   CREATININE 1.12 12/31/2014   BUN 22 12/31/2014   CO2 31 12/31/2014   TSH 1.74 12/31/2014   INR 0.98 11/16/2014   HGBA1C 6.4 12/31/2014    Lab Results  Component Value Date   TSH 1.74 12/31/2014   Lab Results  Component Value Date   WBC 6.7 12/31/2014   HGB 11.9* 12/31/2014   HCT 36.5 12/31/2014   MCV 89.7 12/31/2014   PLT 201.0 12/31/2014   Lab Results  Component Value Date   NA 137 12/31/2014   K 3.6 12/31/2014   CO2 31 12/31/2014   GLUCOSE 159* 12/31/2014   BUN 22 12/31/2014   CREATININE 1.12 12/31/2014   BILITOT 0.7 12/31/2014   ALKPHOS 81 12/31/2014   AST 16 12/31/2014   ALT 9 12/31/2014   PROT 6.5 12/31/2014   ALBUMIN 3.9 12/31/2014   CALCIUM 9.5 12/31/2014   ANIONGAP 8 11/30/2014   GFR 49.98* 12/31/2014   Lab Results  Component Value Date   CHOL 131 12/31/2014   Lab Results  Component Value Date   HDL 44.80 12/31/2014   Lab Results  Component Value Date   LDLCALC 59 12/31/2014   Lab Results  Component Value Date   TRIG 138.0 12/31/2014   Lab Results  Component Value Date   CHOLHDL 3  12/31/2014   Lab Results  Component Value Date   HGBA1C 6.4 12/31/2014       Assessment & Plan:   Problem List Items Addressed This Visit    None    Visit Diagnoses    Need for influenza vaccination    -  Primary    Relevant Orders    Flu vaccine HIGH DOSE PF (Fluzone High dose) (Completed)       I have discontinued Ms. Borrelli's aspirin and ciprofloxacin. I am also having her maintain her magnesium oxide, ONE TOUCH ULTRA SYSTEM KIT, ONETOUCH DELICA LANCETS FINE, omeprazole, simvastatin, potassium chloride SA, isosorbide mononitrate, losartan-hydrochlorothiazide, metoprolol succinate, glucose blood, cholecalciferol, pramipexole, amLODipine, metFORMIN, and rivaroxaban.  No orders of the defined types were placed in this encounter.     Elizabeth Sauer, LPN

## 2015-04-05 NOTE — Progress Notes (Signed)
Pre visit review using our clinic review tool, if applicable. No additional management support is needed unless otherwise documented below in the visit note. 

## 2015-04-05 NOTE — Assessment & Plan Note (Signed)
Well controlled, no changes to meds. Encouraged heart healthy diet such as the DASH diet and exercise as tolerated.  °

## 2015-04-05 NOTE — Patient Instructions (Addendum)
Salon Pas or Aspercreme patches found over the counter for pain    Epley Maneuver Self-Care WHAT IS THE EPLEY MANEUVER? The Epley maneuver is an exercise you can do to relieve symptoms of benign paroxysmal positional vertigo (BPPV). This condition is often just referred to as vertigo. BPPV is caused by the movement of tiny crystals (canaliths) inside your inner ear. The accumulation and movement of canaliths in your inner ear causes a sudden spinning sensation (vertigo) when you move your head to certain positions. Vertigo usually lasts about 30 seconds. BPPV usually occurs in just one ear. If you get vertigo when you lie on your left side, you probably have BPPV in your left ear. Your health care provider can tell you which ear is involved.  BPPV may be caused by a head injury. Many people older than 50 get BPPV for unknown reasons. If you have been diagnosed with BPPV, your health care provider may teach you how to do this maneuver. BPPV is not life threatening (benign) and usually goes away in time.  WHEN SHOULD I PERFORM THE EPLEY MANEUVER? You can do this maneuver at home whenever you have symptoms of vertigo. You may do the Epley maneuver up to 3 times a day until your symptoms of vertigo go away. HOW SHOULD I DO THE EPLEY MANEUVER?  Sit on the edge of a bed or table with your back straight. Your legs should be extended or hanging over the edge of the bed or table.   Turn your head halfway toward the affected ear.   Lie backward quickly with your head turned until you are lying flat on your back. You may want to position a pillow under your shoulders.   Hold this position for 30 seconds. You may experience an attack of vertigo. This is normal. Hold this position until the vertigo stops.  Then turn your head to the opposite direction until your unaffected ear is facing the floor.   Hold this position for 30 seconds. You may experience an attack of vertigo. This is normal. Hold this  position until the vertigo stops.  Now turn your whole body to the same side as your head. Hold for another 30 seconds.   You can then sit back up. ARE THERE RISKS TO THIS MANEUVER? In some cases, you may have other symptoms (such as changes in your vision, weakness, or numbness). If you have these symptoms, stop doing the maneuver and call your health care provider. Even if doing these maneuvers relieves your vertigo, you may still have dizziness. Dizziness is the sensation of light-headedness but without the sensation of movement. Even though the Epley maneuver may relieve your vertigo, it is possible that your symptoms will return within 5 years. WHAT SHOULD I DO AFTER THIS MANEUVER? After doing the Epley maneuver, you can return to your normal activities. Ask your doctor if there is anything you should do at home to prevent vertigo. This may include:  Sleeping with two or more pillows to keep your head elevated.  Not sleeping on the side of your affected ear.  Getting up slowly from bed.  Avoiding sudden movements during the day.  Avoiding extreme head movement, like looking up or bending over.  Wearing a cervical collar to prevent sudden head movements. WHAT SHOULD I DO IF MY SYMPTOMS GET WORSE? Call your health care provider if your vertigo gets worse. Call your provider right way if you have other symptoms, including:   Nausea.  Vomiting.  Headache.  Weakness.  Numbness.  Vision changes. Document Released: 06/30/2013 Document Reviewed: 06/30/2013 [facility] Patient Information 2015 [place], [place]. This information is not intended to replace advice given to you by your health care provider. Make sure you discuss any questions you have with your health care provider.    Muscle Cramps and Spasms Muscle cramps and spasms occur when a muscle or muscles tighten and you have no control over this tightening (involuntary muscle contraction). They are a common problem and can  develop in any muscle. The most common place is in the calf muscles of the leg. Both muscle cramps and muscle spasms are involuntary muscle contractions, but they also have differences:   Muscle cramps are sporadic and painful. They may last a few seconds to a quarter of an hour. Muscle cramps are often more forceful and last longer than muscle spasms.  Muscle spasms may or may not be painful. They may also last just a few seconds or much longer. CAUSES  It is uncommon for cramps or spasms to be due to a serious underlying problem. In many cases, the cause of cramps or spasms is unknown. Some common causes are:   Overexertion.   Overuse from repetitive motions (doing the same thing over and over).   Remaining in a certain position for a long period of time.   Improper preparation, form, or technique while performing a sport or activity.   Dehydration.   Injury.   Side effects of some medicines.   Abnormally low levels of the salts and ions in your blood (electrolytes), especially potassium and calcium. This could happen if you are taking water pills (diuretics) or you are pregnant.  Some underlying medical problems can make it more likely to develop cramps or spasms. These include, but are not limited to:   Diabetes.   Parkinson disease.   Hormone disorders, such as thyroid problems.   Alcohol abuse.   Diseases specific to muscles, joints, and bones.   Blood vessel disease where not enough blood is getting to the muscles.  HOME CARE INSTRUCTIONS   Stay well hydrated. Drink enough water and fluids to keep your urine clear or pale yellow.  It may be helpful to massage, stretch, and relax the affected muscle.  For tight or tense muscles, use a warm towel, heating pad, or hot shower water directed to the affected area.  If you are sore or have pain after a cramp or spasm, applying ice to the affected area may relieve discomfort.  Put ice in a plastic  bag.  Place a towel between your skin and the bag.  Leave the ice on for 15-20 minutes, 03-04 times a day.  Medicines used to treat a known cause of cramps or spasms may help reduce their frequency or severity. Only take over-the-counter or prescription medicines as directed by your caregiver. SEEK MEDICAL CARE IF:  Your cramps or spasms get more severe, more frequent, or do not improve over time.  MAKE SURE YOU:   Understand these instructions.  Will watch your condition.  Will get help right away if you are not doing well or get worse. Document Released: 12/15/2001 Document Revised: 10/20/2012 Document Reviewed: 06/11/2012 [facility] Patient Information 2015 [place], [place]. This information is not intended to replace advice given to you by your health care provider. Make sure you discuss any questions you have with your health care provider.

## 2015-04-08 ENCOUNTER — Ambulatory Visit: Payer: Medicare Other | Admitting: Family Medicine

## 2015-04-12 ENCOUNTER — Telehealth: Payer: Self-pay | Admitting: Family Medicine

## 2015-04-12 DIAGNOSIS — E119 Type 2 diabetes mellitus without complications: Secondary | ICD-10-CM | POA: Diagnosis not present

## 2015-04-12 DIAGNOSIS — E7800 Pure hypercholesterolemia, unspecified: Secondary | ICD-10-CM | POA: Diagnosis not present

## 2015-04-12 DIAGNOSIS — K219 Gastro-esophageal reflux disease without esophagitis: Secondary | ICD-10-CM | POA: Diagnosis not present

## 2015-04-12 DIAGNOSIS — R251 Tremor, unspecified: Secondary | ICD-10-CM | POA: Diagnosis not present

## 2015-04-12 DIAGNOSIS — I10 Essential (primary) hypertension: Secondary | ICD-10-CM | POA: Diagnosis not present

## 2015-04-12 DIAGNOSIS — R410 Disorientation, unspecified: Secondary | ICD-10-CM | POA: Diagnosis not present

## 2015-04-12 DIAGNOSIS — M542 Cervicalgia: Secondary | ICD-10-CM | POA: Diagnosis not present

## 2015-04-12 DIAGNOSIS — R2681 Unsteadiness on feet: Secondary | ICD-10-CM | POA: Diagnosis not present

## 2015-04-12 DIAGNOSIS — Z86718 Personal history of other venous thrombosis and embolism: Secondary | ICD-10-CM | POA: Diagnosis not present

## 2015-04-12 DIAGNOSIS — R413 Other amnesia: Secondary | ICD-10-CM | POA: Diagnosis not present

## 2015-04-12 NOTE — Telephone Encounter (Signed)
Patient Name: Heather [name]  DOB: Sep 14, 1934    Initial Comment caller states she thinks she has had a stroke- has had weakness on one side, shaking and disoriented   Nurse Assessment  Nurse: Leilani Merl, RN, Nira Conn Date/Time (Eastern Time): 04/12/2015 9:57:01 AM  Confirm and document reason for call. If symptomatic, describe symptoms. ---Caller states that she started with right side shaking, difficulty writing, difficulty with her memory and balance on Saturday. She is still having headaches and her shoulder pain which is why she was seen last week and she was given citalopram and baclofen.  Has the patient traveled out of the country within the last 30 days? ---Not Applicable  Does the patient have any new or worsening symptoms? ---Yes  Will a triage be completed? ---Yes  Related visit to physician within the last 2 weeks? ---No  Does the PT have any chronic conditions? (i.e. diabetes, asthma, etc.) ---Yes  List chronic conditions. ---see MR     Guidelines    Guideline Title Affirmed Question Affirmed Notes  Neurologic Deficit Headache (and neurologic deficit)    Final Disposition User   Go to ED Now Standifer, RN, Heather    Referrals  GO TO FACILITY OTHER - SPECIFY   Disagree/Comply: Comply

## 2015-04-17 ENCOUNTER — Encounter: Payer: Self-pay | Admitting: Family Medicine

## 2015-04-17 DIAGNOSIS — R51 Headache: Secondary | ICD-10-CM

## 2015-04-17 DIAGNOSIS — R519 Headache, unspecified: Secondary | ICD-10-CM | POA: Insufficient documentation

## 2015-04-17 DIAGNOSIS — R197 Diarrhea, unspecified: Secondary | ICD-10-CM | POA: Insufficient documentation

## 2015-04-17 NOTE — Telephone Encounter (Signed)
Patient in ER in [place] while on vacation. She came in with numerous complaints including weakness after starting new meds. Her work up was unremarkable. It is agreed she will stop Baclofen and drop the Lexapro to a 1/2 tab. Seek further care while on vacation as needed and follow up here when she returns.

## 2015-04-17 NOTE — Progress Notes (Signed)
 Subjective:    Patient ID: Heather [name], female    DOB: 06/18/1935, 80 y.o.   MRN: [id number]  Chief Complaint  Patient presents with  . Shoulder Pain  . Headache    HPI HPI Patient is a 80-year-old Caucasian female who is in today coming by family with many complaints. He has headache presently and has been having headaches off and on for the last couple of months. She describes 3-4 week they last minutes and they start suddenly. She has a little photophobia when they occur but denies phonophobia or nausea. She describes a nagging discomfort in her forehead for a couple of hours but Tylenol is helpful. No other neurologic complaints. Has seen her eye doctor in [place] Neese no new concerns identified there. Also acknowledges significant anxiety and depression. Has had a low mood and anhedonia. Has not been eating due to the lack of hunger and has struggled with fatigue. Did have migraines when she was younger especially during pregnancy. She is struggling with numerous loose stool daily. No bloody or tarry stool. She is complaining of some shoulder pain and neck pain for the past 2 weeks but denies any falls or trauma. She denies any calf pain or worsening of her blood clot. Denies CP/palp/SOB/HA/congestion/fevers. Taking meds as prescribed   Past Medical History  Diagnosis Date  . Coronary artery disease     post percutaneous coronary intervention in 2006  . Hyperlipidemia   . Restless legs     takes mirapex daily  . Fatigue   . Persistent disorder of initiating or maintaining sleep   . Dermatitis   . S/P inguinal herniorrhaphy   . DJD (degenerative joint disease) of knee 09/18/2011    knees- both, elbow- R, ankles   . Hypertension     takes medications daily  . GERD (gastroesophageal reflux disease)     prilosec daily  . H/O dizziness     pre-syncope/due to dehydration  . Occasional tremors   . Diverticulitis     hx of  . Gastric ulcer     hx of  . Angina   .  Atrial fibrillation (HCC)   . Dysrhythmia, cardiac     "palpitations"  . Borderline diabetes   . H/O hiatal hernia   . Stroke (HCC) 2000    TIA- series that have resolved-  . Hx of total knee replacement 11/12/11    left knee  . UTI (lower urinary tract infection) 10/04/2013  . Medicare annual wellness visit, subsequent 11/22/2013  . Hip pain 08/28/2011  . Diabetes mellitus without complication (HCC)     no meds  . Depression with anxiety 01/05/2012  . Diarrhea 04/17/2015  . Headache 04/17/2015    Past Surgical History  Procedure Laterality Date  . Abdominal hysterectomy    . Knee arthroscopy      2 on left and 1 on right  . Foot surgery      left foot stress fracture repair  . Hernia repair      1989- esophageal hernia   . Upper gastrointestinal endoscopy  2013  . Total knee arthroplasty  11/12/2011    Procedure: TOTAL KNEE ARTHROPLASTY;  Surgeon: Robert A Wainer, MD;  Location: [facility] OR;  Service: Orthopedics;  Laterality: Left;  Dr Wainer wants 90 minutes for this case  . Coronary angioplasty  03/2005; 06/2005    "1" plus "1"= "2 total"  . Brain surgery      related to stroke   2000  .   Joint replacement      left  . Total knee arthroplasty Right 11/29/2014    Procedure: RIGHT TOTAL KNEE ARTHROPLASTY;  Surgeon: Steve Lucey, MD;  Location: [facility] OR;  Service: Orthopedics;  Laterality: Right;  . Eye surgery  11-16-13    cataract -right eye, left eye summer 2015    Family History  Problem Relation Age of Onset  . Arthritis      family hx of  . Hypertension      family hx of  . Other      family hx of cardiovascular disorder  . Stroke Father     family hx of M 1st degree relative <50  . Coronary artery disease Mother   . Heart disease Mother   . Depression Brother   . Breast cancer Neg Hx   . Colon cancer Neg Hx   . Anesthesia problems Neg Hx   . Hypotension Neg Hx   . Malignant hyperthermia Neg Hx   . Pseudochol deficiency Neg Hx   . Stroke Brother   . Diabetes Brother   .  Cancer Brother     bladder with mets  . Diabetes Daughter     borderline  . Hypertension Daughter   . Thyroid disease Daughter     Social History   Social History  . Marital Status: Married    Spouse Name: N/A  . Number of Children: 3  . Years of Education: 12   Occupational History  . works partime in office     retired   Social History Main Topics  . Smoking status: Never Smoker   . Smokeless tobacco: Never Used  . Alcohol Use: No  . Drug Use: No  . Sexual Activity: Yes    Birth Control/ Protection: Surgical     Comment: lives with husband, no dietary restrictions   Other Topics Concern  . Not on file   Social History Narrative   Lives with husband   Caffeine use- none    Outpatient Prescriptions Prior to Visit  Medication Sig Dispense Refill  . amLODipine (NORVASC) 10 MG tablet TAKE 1/2 TABLET EVERY DAY 45 tablet 1  . Blood Glucose Monitoring Suppl (ONE TOUCH ULTRA SYSTEM KIT) W/DEVICE KIT 1 kit by Does not apply route once. DX: 250.00 Check sugars daily as needed 1 each 0  . cholecalciferol (VITAMIN D) 1000 UNITS tablet Take 1,000 Units by mouth daily.    . glucose blood (ONE TOUCH TEST STRIPS) test strip Check BS daily and prn DX: E11.9 100 each 11  . isosorbide mononitrate (IMDUR) 60 MG 24 hr tablet Take 0.5 tablets (30 mg total) by mouth daily. 45 tablet 2  . losartan-hydrochlorothiazide (HYZAAR) 100-25 MG per tablet TAKE ONE TABLET BY MOUTH ONCE DAILY 90 tablet 3  . magnesium oxide (MAG-OX) 400 MG tablet Take 400 mg by mouth 2 (two) times daily.     . metFORMIN (GLUCOPHAGE) 500 MG tablet TAKE 1 TABLET (500 MG TOTAL) BY MOUTH DAILY WITH BREAKFAST. 90 tablet 1  . metoprolol succinate (TOPROL-XL) 50 MG 24 hr tablet TAKE ONE TABLET BY MOUTH TWICE DAILY IMMEDIATELY  FOLOWING  A  MEAL 180 tablet 2  . omeprazole (PRILOSEC) 20 MG capsule Take 20 mg by mouth 2 (two) times daily before a meal.    . ONETOUCH DELICA LANCETS FINE MISC Check sugars daily and prn  DX  250.00 100 each 1  . potassium chloride SA (KLOR-CON M20) 20 MEQ tablet Take 1 tablet (20 mEq total)   by mouth daily. 90 tablet 2  . pramipexole (MIRAPEX) 0.25 MG tablet TAKE 1 TABLET TWICE DAILY 180 tablet 1  . rivaroxaban (XARELTO) 20 MG TABS tablet Take 1 tablet (20 mg total) by mouth daily with supper. 30 tablet 1  . simvastatin (ZOCOR) 20 MG tablet TAKE ONE TABLET BY MOUTH AT BEDTIME 90 tablet 3  . aspirin 81 MG tablet Take 81 mg by mouth daily.    . ciprofloxacin (CIPRO) 250 MG tablet Take 1 tablet (250 mg total) by mouth 2 (two) times daily. 10 tablet 0   No facility-administered medications prior to visit.    No Known Allergies  Review of Systems  Constitutional: Positive for malaise/fatigue. Negative for fever.  HENT: Negative for congestion.   Eyes: Positive for photophobia. Negative for discharge.  Respiratory: Negative for shortness of breath.   Cardiovascular: Negative for chest pain, palpitations and leg swelling.  Gastrointestinal: Positive for abdominal pain and diarrhea. Negative for nausea, constipation and blood in stool.  Genitourinary: Positive for frequency. Negative for dysuria.  Musculoskeletal: Positive for myalgias, joint pain and neck pain. Negative for falls.  Skin: Negative for rash.  Neurological: Positive for dizziness and headaches. Negative for loss of consciousness.  Endo/Heme/Allergies: Negative for environmental allergies.  Psychiatric/Behavioral: Negative for depression. The patient is not nervous/anxious.        Objective:    Physical Exam  Constitutional: She is oriented to person, place, and time. She appears well-developed and well-nourished. No distress.  HENT:  Head: Normocephalic and atraumatic.  Nose: Nose normal.  Eyes: Right eye exhibits no discharge. Left eye exhibits no discharge.  Neck: Normal range of motion. Neck supple.  Cardiovascular: Normal rate and regular rhythm.   Pulmonary/Chest: Effort normal and breath sounds normal.    Abdominal: Soft. Bowel sounds are normal. She exhibits no mass. There is no tenderness. There is no rebound and no guarding.  Musculoskeletal: She exhibits no edema.  Neurological: She is alert and oriented to person, place, and time.  Skin: Skin is warm and dry.  Psychiatric: She has a normal mood and affect.  Nursing note and vitals reviewed.   BP 140/70 mmHg  Pulse 64  Temp(Src) 97.7 F (36.5 C) (Oral)  Ht 5' 3" (1.6 m)  Wt 154 lb 6 oz (70.024 kg)  BMI 27.35 kg/m2  SpO2 99% Wt Readings from Last 3 Encounters:  04/05/15 154 lb 6 oz (70.024 kg)  01/18/15 156 lb (70.761 kg)  01/17/15 157 lb (71.215 kg)     Lab Results  Component Value Date   WBC 7.0 04/05/2015   HGB 13.5 04/05/2015   HCT 40.8 04/05/2015   PLT 185.0 04/05/2015   GLUCOSE 122* 04/05/2015   CHOL 151 04/05/2015   TRIG 153.0* 04/05/2015   HDL 59.90 04/05/2015   LDLDIRECT 73.4 05/20/2014   LDLCALC 60 04/05/2015   ALT 15 04/05/2015   AST 18 04/05/2015   NA 138 04/05/2015   K 3.8 04/05/2015   CL 100 04/05/2015   CREATININE 0.84 04/05/2015   BUN 17 04/05/2015   CO2 30 04/05/2015   TSH 1.29 04/05/2015   INR 0.98 11/16/2014   HGBA1C 6.8* 04/05/2015    Lab Results  Component Value Date   TSH 1.29 04/05/2015   Lab Results  Component Value Date   WBC 7.0 04/05/2015   HGB 13.5 04/05/2015   HCT 40.8 04/05/2015   MCV 87.0 04/05/2015   PLT 185.0 04/05/2015   Lab Results  Component Value Date   NA  138 04/05/2015   K 3.8 04/05/2015   CO2 30 04/05/2015   GLUCOSE 122* 04/05/2015   BUN 17 04/05/2015   CREATININE 0.84 04/05/2015   BILITOT 0.6 04/05/2015   ALKPHOS 88 04/05/2015   AST 18 04/05/2015   ALT 15 04/05/2015   PROT 6.8 04/05/2015   ALBUMIN 4.4 04/05/2015   CALCIUM 9.8 04/05/2015   ANIONGAP 8 11/30/2014   GFR 69.61 04/05/2015   Lab Results  Component Value Date   CHOL 151 04/05/2015   Lab Results  Component Value Date   HDL 59.90 04/05/2015   Lab Results  Component Value Date    LDLCALC 60 04/05/2015   Lab Results  Component Value Date   TRIG 153.0* 04/05/2015   Lab Results  Component Value Date   CHOLHDL 3 04/05/2015   Lab Results  Component Value Date   HGBA1C 6.8* 04/05/2015       Assessment & Plan:   Problem List Items Addressed This Visit    UTI (lower urinary tract infection)    Urine studies unremarkable today      Peroneal DVT (deep venous thrombosis) (HCC)    Repeat ultrasound in November and stop anticoagulation if resolved at that time      Relevant Orders   US Venous Img Lower Unilateral Right   Neck pain    With shoulder pain. Encouraged moist heat and gentle stretching as tolerated. May try NSAIDs and prescription meds as directed and report if symptoms worsen or seek immediate care. Referred to sports med for further interventions at this time. May need more referrals in future      Hypokalemia   Relevant Orders   Comprehensive metabolic panel (Completed)   Hyperlipidemia   Relevant Orders   Lipid panel (Completed)   Headache    Encouraged increased hydration, 64 ounces of clear fluids daily. Minimize alcohol and caffeine. Eat small frequent meals with lean proteins and complex carbs. Avoid high and low blood sugars. Get adequate sleep, 7-8 hours a night. Needs exercise daily preferably in the morning.      Relevant Medications   escitalopram (LEXAPRO) 10 MG tablet   baclofen (LIORESAL) 10 MG tablet   Other Relevant Orders   Ambulatory referral to Sports Medicine   Essential hypertension    Well controlled, no changes to meds. Encouraged heart healthy diet such as the DASH diet and exercise as tolerated.       Relevant Orders   CBC (Completed)   Comprehensive metabolic panel (Completed)   TSH (Completed)   DM (diabetes mellitus) (HCC)    hgba1c acceptable, minimize simple carbs. Increase exercise as tolerated. Continue current meds      Diarrhea    Stool studies ordered. Encouraged daily probiotics and fiber  supplements. Report worsening symptoms      Relevant Orders   Urinalysis (Completed)   Ova and Parasite Examination   Stool C-Diff Toxin Assay   Stool, WBC/Lactoferrin   Sed Rate (ESR) (Completed)   Depression with anxiety    Has been worsening, will add Lexapro 10 mg daily. Reassess at next visit or as needed      Anemia   Relevant Orders   CBC (Completed)    Other Visit Diagnoses    Need for influenza vaccination    -  Primary    Relevant Orders    Flu vaccine HIGH DOSE PF (Fluzone High dose) (Completed)    History of DVT (deep vein thrombosis)        Pain   in joint, shoulder region, unspecified laterality        Relevant Orders    Ambulatory referral to Sports Medicine    Loss of appetite        Relevant Orders    H. pylori antibody, IgG (Completed)       I have discontinued Heather [name]'s aspirin and ciprofloxacin. I am also having her start on escitalopram and baclofen. Additionally, I am having her maintain her magnesium oxide, ONE TOUCH ULTRA SYSTEM KIT, ONETOUCH DELICA LANCETS FINE, omeprazole, simvastatin, potassium chloride SA, isosorbide mononitrate, losartan-hydrochlorothiazide, metoprolol succinate, glucose blood, cholecalciferol, pramipexole, amLODipine, metFORMIN, and rivaroxaban.  Meds ordered this encounter  Medications  . escitalopram (LEXAPRO) 10 MG tablet    Sig: Take 1 tablet (10 mg total) by mouth at bedtime.    Dispense:  30 tablet    Refill:  5  . baclofen (LIORESAL) 10 MG tablet    Sig: Take 1-2 tablets (10-20 mg total) by mouth at bedtime as needed for muscle spasms.    Dispense:  30 each    Refill:  1     [name], MD

## 2015-04-17 NOTE — Assessment & Plan Note (Signed)
With shoulder pain. Encouraged moist heat and gentle stretching as tolerated. May try NSAIDs and prescription meds as directed and report if symptoms worsen or seek immediate care. Referred to sports med for further interventions at this time. May need more referrals in future

## 2015-04-17 NOTE — Assessment & Plan Note (Signed)
Urine studies unremarkable today

## 2015-04-17 NOTE — Assessment & Plan Note (Signed)
Repeat ultrasound in November and stop anticoagulation if resolved at that time

## 2015-04-17 NOTE — Assessment & Plan Note (Signed)
Has been worsening, will add Lexapro 10 mg daily. Reassess at next visit or as needed

## 2015-04-17 NOTE — Assessment & Plan Note (Signed)
Encouraged increased hydration, 64 ounces of clear fluids daily. Minimize alcohol and caffeine. Eat small frequent meals with lean proteins and complex carbs. Avoid high and low blood sugars. Get adequate sleep, 7-8 hours a night. Needs exercise daily preferably in the morning.  

## 2015-04-17 NOTE — Progress Notes (Deleted)
Subjective:    Patient ID: Heather [name], female    DOB: [birth year], 80 y.o.   MRN: [id number]  Chief Complaint  Patient presents with  . Shoulder Pain  . Headache    HPI Patient is a 80 year old Caucasian female who is in today coming by family with many complaints. He has headache presently and has been having headaches off and on for the last couple of months. She describes 3-4 week they last minutes and they start suddenly. She has a little photophobia when they occur but denies phonophobia or nausea. She describes a nagging discomfort in her forehead for a couple of hours but Tylenol is helpful. No other neurologic complaints. Has seen her eye doctor in [place] Dr. Janit Bern no new concerns identified there. Also acknowledges significant anxiety and depression. Has had a low mood and anhedonia. Has not been eating due to the lack of hunger and has struggled with fatigue. Did have migraines when she was younger especially during pregnancy. She is struggling with numerous loose stool daily. No bloody or tarry stool. She is complaining of some shoulder pain and neck pain for the past 2 weeks but denies any falls or trauma. She denies any calf pain or worsening of her blood clot. Denies CP/palp/SOB/HA/congestion/fevers. Taking meds as prescribed  Past Medical History  Diagnosis Date  . Coronary artery disease     post percutaneous coronary intervention in 2006  . Hyperlipidemia   . Restless legs     takes mirapex daily  . Fatigue   . Persistent disorder of initiating or maintaining sleep   . Dermatitis   . S/P inguinal herniorrhaphy   . DJD (degenerative joint disease) of knee 09/18/2011    knees- both, elbow- R, ankles   . Hypertension     takes medications daily  . GERD (gastroesophageal reflux disease)     prilosec daily  . H/O dizziness     pre-syncope/due to dehydration  . Occasional tremors   . Diverticulitis     hx of  . Gastric ulcer     hx of  . Angina   . Atrial  fibrillation ([place])   . Dysrhythmia, cardiac     "palpitations"  . Borderline diabetes   . H/O hiatal hernia   . Stroke [facility]) 2000    TIA- series that have resolved-  . Hx of total knee replacement 11/12/11    left knee  . UTI (lower urinary tract infection) 10/04/2013  . Medicare annual wellness visit, subsequent 11/22/2013  . Hip pain 08/28/2011  . Diabetes mellitus without complication (HCC)     no meds  . Depression with anxiety 01/05/2012  . Diarrhea 04/17/2015  . Headache 04/17/2015    Past Surgical History  Procedure Laterality Date  . Abdominal hysterectomy    . Knee arthroscopy      2 on left and 1 on right  . Foot surgery      left foot stress fracture repair  . Hernia repair      1989- esophageal hernia   . Upper gastrointestinal endoscopy  2013  . Total knee arthroplasty  11/12/2011    Procedure: TOTAL KNEE ARTHROPLASTY;  Surgeon: Lorn Junes, MD;  Location: [place];  Service: Orthopedics;  Laterality: Left;  Dr Noemi Chapel wants 90 minutes for this case  . Coronary angioplasty  03/2005; 06/2005    "1" plus "1"= "2 total"  . Brain surgery      related to stroke   2000  .  Joint replacement      left  . Total knee arthroplasty Right 11/29/2014    Procedure: RIGHT TOTAL KNEE ARTHROPLASTY;  Surgeon: Vickey Huger, MD;  Location: [place];  Service: Orthopedics;  Laterality: Right;  . Eye surgery  11-16-13    cataract -right eye, left eye summer 2015    Family History  Problem Relation Age of Onset  . Arthritis      family hx of  . Hypertension      family hx of  . Other      family hx of cardiovascular disorder  . Stroke Father     family hx of M 1st degree relative <50  . Coronary artery disease Mother   . Heart disease Mother   . Depression Brother   . Breast cancer Neg Hx   . Colon cancer Neg Hx   . Anesthesia problems Neg Hx   . Hypotension Neg Hx   . Malignant hyperthermia Neg Hx   . Pseudochol deficiency Neg Hx   . Stroke Brother   . Diabetes Brother   . Cancer  Brother     bladder with mets  . Diabetes Daughter     borderline  . Hypertension Daughter   . Thyroid disease Daughter     Social History   Social History  . Marital Status: Married    Spouse Name: N/A  . Number of Children: 3  . Years of Education: 12   Occupational History  . works partime in office     retired   Social History Main Topics  . Smoking status: Never Smoker   . Smokeless tobacco: Never Used  . Alcohol Use: No  . Drug Use: No  . Sexual Activity: Yes    Birth Control/ Protection: Surgical     Comment: lives with husband, no dietary restrictions   Other Topics Concern  . Not on file   Social History Narrative   Lives with husband   Caffeine use- none    Outpatient Prescriptions Prior to Visit  Medication Sig Dispense Refill  . amLODipine (NORVASC) 10 MG tablet TAKE 1/2 TABLET EVERY DAY 45 tablet 1  . Blood Glucose Monitoring Suppl (ONE TOUCH ULTRA SYSTEM KIT) W/DEVICE KIT 1 kit by Does not apply route once. DX: 250.00 Check sugars daily as needed 1 each 0  . cholecalciferol (VITAMIN D) 1000 UNITS tablet Take 1,000 Units by mouth daily.    Marland Kitchen glucose blood (ONE TOUCH TEST STRIPS) test strip Check BS daily and prn DX: E11.9 100 each 11  . isosorbide mononitrate (IMDUR) 60 MG 24 hr tablet Take 0.5 tablets (30 mg total) by mouth daily. 45 tablet 2  . losartan-hydrochlorothiazide (HYZAAR) 100-25 MG per tablet TAKE ONE TABLET BY MOUTH ONCE DAILY 90 tablet 3  . magnesium oxide (MAG-OX) 400 MG tablet Take 400 mg by mouth 2 (two) times daily.     . metFORMIN (GLUCOPHAGE) 500 MG tablet TAKE 1 TABLET (500 MG TOTAL) BY MOUTH DAILY WITH BREAKFAST. 90 tablet 1  . metoprolol succinate (TOPROL-XL) 50 MG 24 hr tablet TAKE ONE TABLET BY MOUTH TWICE DAILY IMMEDIATELY  FOLOWING  A  MEAL 180 tablet 2  . omeprazole (PRILOSEC) 20 MG capsule Take 20 mg by mouth 2 (two) times daily before a meal.    . ONETOUCH DELICA LANCETS FINE MISC Check sugars daily and prn  DX 250.00 100  each 1  . potassium chloride SA (KLOR-CON M20) 20 MEQ tablet Take 1 tablet (20 mEq total)  by mouth daily. 90 tablet 2  . pramipexole (MIRAPEX) 0.25 MG tablet TAKE 1 TABLET TWICE DAILY 180 tablet 1  . rivaroxaban (XARELTO) 20 MG TABS tablet Take 1 tablet (20 mg total) by mouth daily with supper. 30 tablet 1  . simvastatin (ZOCOR) 20 MG tablet TAKE ONE TABLET BY MOUTH AT BEDTIME 90 tablet 3  . aspirin 81 MG tablet Take 81 mg by mouth daily.    . ciprofloxacin (CIPRO) 250 MG tablet Take 1 tablet (250 mg total) by mouth 2 (two) times daily. 10 tablet 0   No facility-administered medications prior to visit.    No Known Allergies  Review of Systems  Constitutional: Positive for malaise/fatigue. Negative for fever.  HENT: Negative for congestion.   Eyes: Positive for photophobia. Negative for discharge.  Respiratory: Negative for shortness of breath.   Cardiovascular: Negative for chest pain, palpitations and leg swelling.  Gastrointestinal: Positive for abdominal pain and diarrhea. Negative for nausea, constipation, blood in stool and melena.  Genitourinary: Negative for dysuria.  Musculoskeletal: Positive for myalgias, joint pain and neck pain. Negative for falls.  Skin: Negative for rash.  Neurological: Positive for dizziness and headaches. Negative for loss of consciousness.  Endo/Heme/Allergies: Negative for environmental allergies.  Psychiatric/Behavioral: Negative for depression. The patient is not nervous/anxious.        Objective:    Physical Exam  Constitutional: She is oriented to person, place, and time. She appears well-developed and well-nourished. No distress.  HENT:  Head: Normocephalic and atraumatic.  Nose: Nose normal.  Eyes: Right eye exhibits no discharge. Left eye exhibits no discharge.  Neck: Normal range of motion. Neck supple.  Cardiovascular: Normal rate and regular rhythm.   Pulmonary/Chest: Effort normal and breath sounds normal.  Abdominal: Soft. Bowel  sounds are normal. There is no tenderness. There is no rebound and no guarding.  Musculoskeletal: Normal range of motion. She exhibits tenderness. She exhibits no edema.  Neurological: She is alert and oriented to person, place, and time.  Skin: Skin is warm and dry.  Psychiatric: She has a normal mood and affect.  Nursing note and vitals reviewed.   BP 140/70 mmHg  Pulse 64  Temp(Src) 97.7 F (36.5 C) (Oral)  Ht _0  (1.6 m)  Wt 154 lb 6 oz (70.024 kg)  BMI 27.35 kg/m2  SpO2 99% Wt Readings from Last 3 Encounters:  04/05/15 154 lb 6 oz (70.024 kg)  01/18/15 156 lb (70.761 kg)  01/17/15 157 lb (71.215 kg)     Lab Results  Component Value Date   WBC 7.0 04/05/2015   HGB 13.5 04/05/2015   HCT 40.8 04/05/2015   PLT 185.0 04/05/2015   GLUCOSE 122* 04/05/2015   CHOL 151 04/05/2015   TRIG 153.0* 04/05/2015   HDL 59.90 04/05/2015   LDLDIRECT 73.4 05/20/2014   LDLCALC 60 04/05/2015   ALT 15 04/05/2015   AST 18 04/05/2015   NA 138 04/05/2015   K 3.8 04/05/2015   CL 100 04/05/2015   CREATININE 0.84 04/05/2015   BUN 17 04/05/2015   CO2 30 04/05/2015   TSH 1.29 04/05/2015   INR 0.98 11/16/2014   HGBA1C 6.8* 04/05/2015    Lab Results  Component Value Date   TSH 1.29 04/05/2015   Lab Results  Component Value Date   WBC 7.0 04/05/2015   HGB 13.5 04/05/2015   HCT 40.8 04/05/2015   MCV 87.0 04/05/2015   PLT 185.0 04/05/2015   Lab Results  Component Value Date   NA  138 04/05/2015   K 3.8 04/05/2015   CO2 30 04/05/2015   GLUCOSE 122* 04/05/2015   BUN 17 04/05/2015   CREATININE 0.84 04/05/2015   BILITOT 0.6 04/05/2015   ALKPHOS 88 04/05/2015   AST 18 04/05/2015   ALT 15 04/05/2015   PROT 6.8 04/05/2015   ALBUMIN 4.4 04/05/2015   CALCIUM 9.8 04/05/2015   ANIONGAP 8 11/30/2014   GFR 69.61 04/05/2015   Lab Results  Component Value Date   CHOL 151 04/05/2015   Lab Results  Component Value Date   HDL 59.90 04/05/2015   Lab Results  Component Value Date     LDLCALC 60 04/05/2015   Lab Results  Component Value Date   TRIG 153.0* 04/05/2015   Lab Results  Component Value Date   CHOLHDL 3 04/05/2015   Lab Results  Component Value Date   HGBA1C 6.8* 04/05/2015       Assessment & Plan:   Problem List Items Addressed This Visit    UTI (lower urinary tract infection)    Urine studies unremarkable today      Peroneal DVT (deep venous thrombosis) ([place])    Repeat ultrasound in November and stop anticoagulation if resolved at that time      Relevant Orders   US Venous Img Lower Unilateral Right   Neck pain    With shoulder pain. Encouraged moist heat and gentle stretching as tolerated. May try NSAIDs and prescription meds as directed and report if symptoms worsen or seek immediate care. Referred to sports med for further interventions at this time. May need more referrals in future      Hypokalemia   Relevant Orders   Comprehensive metabolic panel (Completed)   Hyperlipidemia   Relevant Orders   Lipid panel (Completed)   Headache    Encouraged increased hydration, 64 ounces of clear fluids daily. Minimize alcohol and caffeine. Eat small frequent meals with lean proteins and complex carbs. Avoid high and low blood sugars. Get adequate sleep, 7-8 hours a night. Needs exercise daily preferably in the morning.      Relevant Medications   escitalopram (LEXAPRO) 10 MG tablet   baclofen (LIORESAL) 10 MG tablet   Other Relevant Orders   Ambulatory referral to Sports Medicine   Essential hypertension    Well controlled, no changes to meds. Encouraged heart healthy diet such as the DASH diet and exercise as tolerated.       Relevant Orders   CBC (Completed)   Comprehensive metabolic panel (Completed)   TSH (Completed)   DM (diabetes mellitus) ([place])    hgba1c acceptable, minimize simple carbs. Increase exercise as tolerated. Continue current meds      Diarrhea    Stool studies ordered. Encouraged daily probiotics and fiber  supplements. Report worsening symptoms      Relevant Orders   Urinalysis (Completed)   Ova and Parasite Examination   Stool C-Diff Toxin Assay   Stool, WBC/Lactoferrin   Sed Rate (ESR) (Completed)   Depression with anxiety    Has been worsening, will add Lexapro 10 mg daily. Reassess at next visit or as needed      Anemia   Relevant Orders   CBC (Completed)    Other Visit Diagnoses    Need for influenza vaccination    -  Primary    Relevant Orders    Flu vaccine HIGH DOSE PF (Fluzone High dose) (Completed)    History of DVT (deep vein thrombosis)  Pain in joint, shoulder region, unspecified laterality        Relevant Orders    Ambulatory referral to Sports Medicine    Loss of appetite        Relevant Orders    H. pylori antibody, IgG (Completed)       I have discontinued Ms. Adolph's aspirin and ciprofloxacin. I am also having her start on escitalopram and baclofen. Additionally, I am having her maintain her magnesium oxide, ONE TOUCH ULTRA SYSTEM KIT, ONETOUCH DELICA LANCETS FINE, omeprazole, simvastatin, potassium chloride SA, isosorbide mononitrate, losartan-hydrochlorothiazide, metoprolol succinate, glucose blood, cholecalciferol, pramipexole, amLODipine, metFORMIN, and rivaroxaban.  Meds ordered this encounter  Medications  . escitalopram (LEXAPRO) 10 MG tablet    Sig: Take 1 tablet (10 mg total) by mouth at bedtime.    Dispense:  30 tablet    Refill:  5  . baclofen (LIORESAL) 10 MG tablet    Sig: Take 1-2 tablets (10-20 mg total) by mouth at bedtime as needed for muscle spasms.    Dispense:  30 each    Refill:  1     [name], MD

## 2015-04-17 NOTE — Assessment & Plan Note (Signed)
hgba1c acceptable, minimize simple carbs. Increase exercise as tolerated. Continue current meds 

## 2015-04-17 NOTE — Assessment & Plan Note (Signed)
Stool studies ordered. Encouraged daily probiotics and fiber supplements. Report worsening symptoms

## 2015-04-26 ENCOUNTER — Ambulatory Visit (INDEPENDENT_AMBULATORY_CARE_PROVIDER_SITE_OTHER): Payer: Medicare Other | Admitting: Family Medicine

## 2015-04-26 ENCOUNTER — Encounter: Payer: Self-pay | Admitting: Family Medicine

## 2015-04-26 VITALS — BP 157/72 | HR 58 | Ht 63.0 in | Wt 154.0 lb

## 2015-04-26 DIAGNOSIS — I6523 Occlusion and stenosis of bilateral carotid arteries: Secondary | ICD-10-CM

## 2015-04-26 DIAGNOSIS — M25511 Pain in right shoulder: Secondary | ICD-10-CM | POA: Diagnosis not present

## 2015-04-26 MED ORDER — PREDNISONE 10 MG PO TABS: ORAL_TABLET | ORAL | Status: DC

## 2015-04-26 NOTE — Patient Instructions (Signed)
Your pain is due to spasms/strain of rotator cuff and trapezius muscles, sternoclavicular arthritis. Try prednisone dose pack x 6 days. Try to avoid painful activities (overhead activities, lifting with extended arm) as much as possible. Can take tylenol in addition to this. Consider glucosamine 750mg  twice a day. Consider capsaicin (or voltaren gel) topically 4 times a day. Subacromial injection may be beneficial to help with pain and to decrease inflammation though typically is only beneficial when the pain is more localized to the upper arm. Start physical therapy with transition to home exercise program. Do home exercise program with theraband and scapular stabilization exercises daily - these are very important for long term relief. If not improving at follow-up we will consider further imaging, injection, and/or nitro patches. Follow up with me in 1 month.

## 2015-04-29 DIAGNOSIS — M25511 Pain in right shoulder: Secondary | ICD-10-CM | POA: Insufficient documentation

## 2015-04-29 NOTE — Progress Notes (Signed)
PCP: [name], MD  Subjective:   HPI: Patient is a 80 y.o. female here for right shoulder pain.  Patient reports about 1 month of diffuse right shoulder pain. No known injury. Feels pain when holding a railing climbing stairs, coming up from bending over. Feels pain anterior clavicle through to posterior shoulder. Can feel like a hot, rod sensation. Pain level 5/10 but up to 10/10. Cannot lie on the right side. Has not done therapy or exercises for this. Baclofen caused side effects. No left shoulder pain. No numbness, tingling.  Past Medical History  Diagnosis Date  . Coronary artery disease     post percutaneous coronary intervention in 2006  . Hyperlipidemia   . Restless legs     takes mirapex daily  . Fatigue   . Persistent disorder of initiating or maintaining sleep   . Dermatitis   . S/P inguinal herniorrhaphy   . DJD (degenerative joint disease) of knee 09/18/2011    knees- both, elbow- R, ankles   . Hypertension     takes medications daily  . GERD (gastroesophageal reflux disease)     prilosec daily  . H/O dizziness     pre-syncope/due to dehydration  . Occasional tremors   . Diverticulitis     hx of  . Gastric ulcer     hx of  . Angina   . Atrial fibrillation ([place])   . Dysrhythmia, cardiac     "palpitations"  . Borderline diabetes   . H/O hiatal hernia   . Stroke [facility]) 2000    TIA- series that have resolved-  . Hx of total knee replacement 11/12/11    left knee  . UTI (lower urinary tract infection) 10/04/2013  . Medicare annual wellness visit, subsequent 11/22/2013  . Hip pain 08/28/2011  . Diabetes mellitus without complication (HCC)     no meds  . Depression with anxiety 01/05/2012  . Diarrhea 04/17/2015  . Headache 04/17/2015    Current Outpatient Prescriptions on File Prior to Visit  Medication Sig Dispense Refill  . amLODipine (NORVASC) 10 MG tablet TAKE 1/2 TABLET EVERY DAY 45 tablet 1  . baclofen (LIORESAL) 10 MG tablet Take 1-2 tablets  (10-20 mg total) by mouth at bedtime as needed for muscle spasms. 30 each 1  . Blood Glucose Monitoring Suppl (ONE TOUCH ULTRA SYSTEM KIT) W/DEVICE KIT 1 kit by Does not apply route once. DX: 250.00 Check sugars daily as needed 1 each 0  . cholecalciferol (VITAMIN D) 1000 UNITS tablet Take 1,000 Units by mouth daily.    Marland Kitchen escitalopram (LEXAPRO) 10 MG tablet Take 1 tablet (10 mg total) by mouth at bedtime. 30 tablet 5  . glucose blood (ONE TOUCH TEST STRIPS) test strip Check BS daily and prn DX: E11.9 100 each 11  . isosorbide mononitrate (IMDUR) 60 MG 24 hr tablet Take 0.5 tablets (30 mg total) by mouth daily. 45 tablet 2  . losartan-hydrochlorothiazide (HYZAAR) 100-25 MG per tablet TAKE ONE TABLET BY MOUTH ONCE DAILY 90 tablet 3  . magnesium oxide (MAG-OX) 400 MG tablet Take 400 mg by mouth 2 (two) times daily.     . metFORMIN (GLUCOPHAGE) 500 MG tablet TAKE 1 TABLET (500 MG TOTAL) BY MOUTH DAILY WITH BREAKFAST. 90 tablet 1  . metoprolol succinate (TOPROL-XL) 50 MG 24 hr tablet TAKE ONE TABLET BY MOUTH TWICE DAILY IMMEDIATELY  FOLOWING  A  MEAL 180 tablet 2  . omeprazole (PRILOSEC) 20 MG capsule Take 20 mg by mouth 2 (two)  times daily before a meal.    . ONETOUCH DELICA LANCETS FINE MISC Check sugars daily and prn  DX 250.00 100 each 1  . potassium chloride SA (KLOR-CON M20) 20 MEQ tablet Take 1 tablet (20 mEq total) by mouth daily. 90 tablet 2  . pramipexole (MIRAPEX) 0.25 MG tablet TAKE 1 TABLET TWICE DAILY 180 tablet 1  . rivaroxaban (XARELTO) 20 MG TABS tablet Take 1 tablet (20 mg total) by mouth daily with supper. 30 tablet 1  . simvastatin (ZOCOR) 20 MG tablet TAKE ONE TABLET BY MOUTH AT BEDTIME 90 tablet 3  . [DISCONTINUED] nitroGLYCERIN (NITROSTAT) 0.4 MG SL tablet Place 0.4 mg under the tongue every 5 (five) minutes as needed.       No current facility-administered medications on file prior to visit.    Past Surgical History  Procedure Laterality Date  . Abdominal hysterectomy     . Knee arthroscopy      2 on left and 1 on right  . Foot surgery      left foot stress fracture repair  . Hernia repair      1989- esophageal hernia   . Upper gastrointestinal endoscopy  2013  . Total knee arthroplasty  11/12/2011    Procedure: TOTAL KNEE ARTHROPLASTY;  Surgeon: Lorn Junes, MD;  Location: [place];  Service: Orthopedics;  Laterality: Left;  Dr Noemi Chapel wants 90 minutes for this case  . Coronary angioplasty  03/2005; 06/2005    "1" plus "1"= "2 total"  . Brain surgery      related to stroke   2000  . Joint replacement      left  . Total knee arthroplasty Right 11/29/2014    Procedure: RIGHT TOTAL KNEE ARTHROPLASTY;  Surgeon: Vickey Huger, MD;  Location: [place];  Service: Orthopedics;  Laterality: Right;  . Eye surgery  11-16-13    cataract -right eye, left eye summer 2015    No Known Allergies  Social History   Social History  . Marital Status: Married    Spouse Name: N/A  . Number of Children: 3  . Years of Education: 12   Occupational History  . works partime in office     retired   Social History Main Topics  . Smoking status: Never Smoker   . Smokeless tobacco: Never Used  . Alcohol Use: No  . Drug Use: No  . Sexual Activity: Yes    Birth Control/ Protection: Surgical     Comment: lives with husband, no dietary restrictions   Other Topics Concern  . Not on file   Social History Narrative   Lives with husband   Caffeine use- none    Family History  Problem Relation Age of Onset  . Arthritis      family hx of  . Hypertension      family hx of  . Other      family hx of cardiovascular disorder  . Stroke Father     family hx of M 1st degree relative <50  . Coronary artery disease Mother   . Heart disease Mother   . Depression Brother   . Breast cancer Neg Hx   . Colon cancer Neg Hx   . Anesthesia problems Neg Hx   . Hypotension Neg Hx   . Malignant hyperthermia Neg Hx   . Pseudochol deficiency Neg Hx   . Stroke Brother   . Diabetes  Brother   . Cancer Brother     bladder with mets  .  Diabetes Daughter     borderline  . Hypertension Daughter   . Thyroid disease Daughter     BP 157/72 mmHg  Pulse 58  Ht 5' 3" (1.6 m)  Wt 154 lb (69.854 kg)  BMI 27.29 kg/m2  Review of Systems: See HPI above.    Objective:  Physical Exam:  Gen: NAD  Neck: No gross deformity, swelling, bruising. TTP right trapezius, cervical paraspinal region.  No midline/bony TTP. FROM neck - pain with flexion. BUE strength 5/5.   Sensation intact to light touch.   2+ equal reflexes in triceps, biceps, brachioradialis tendons. Negative spurlings. NV intact distal BUEs.  Right shoulder: Apparent anterior subluxation right clavicle at [facility] joint.  No swelling, ecchymoses, other gross deformity. TTP at [facility] and medial aspect of clavicle. FROM. Negative Hawkins, Neers. Negative Speeds, Yergasons. Strength 5/5 with empty can and resisted internal/external rotation. Negative apprehension. NV intact distally.    Assessment & Plan:  1. Right shoulder pain - consistent with primarily trapezius strain, spasms.  Some soreness with rotator cuff testing but good strength.  [place] joint appears subluxed but from arthritis - able to see spurring on ultrasound (no acute injury).  She would like to try prednisone dose pack.  Discussed nerve irritation leading to pain here is possible but unlikely.  Consider tylenol, glucosamine, capsaicin.  Start physical therapy and home exercises.  F/u in 1 month.

## 2015-04-29 NOTE — Assessment & Plan Note (Signed)
consistent with primarily trapezius strain, spasms.  Some soreness with rotator cuff testing but good strength.  [place] joint appears subluxed but from arthritis - able to see spurring on ultrasound (no acute injury).  She would like to try prednisone dose pack.  Discussed nerve irritation leading to pain here is possible but unlikely.  Consider tylenol, glucosamine, capsaicin.  Start physical therapy and home exercises.  F/u in 1 month.

## 2015-05-02 ENCOUNTER — Ambulatory Visit: Payer: Medicare Other | Admitting: Cardiovascular Disease

## 2015-05-03 ENCOUNTER — Other Ambulatory Visit: Payer: Medicare Other

## 2015-05-03 ENCOUNTER — Ambulatory Visit (HOSPITAL_BASED_OUTPATIENT_CLINIC_OR_DEPARTMENT_OTHER)
Admission: RE | Admit: 2015-05-03 | Discharge: 2015-05-03 | Disposition: A | Discharge status: Home / Self Care | Payer: Medicare Other | Source: Ambulatory Visit | Attending: Family Medicine | Admitting: Family Medicine

## 2015-05-03 ENCOUNTER — Other Ambulatory Visit: Payer: Self-pay | Admitting: Family Medicine

## 2015-05-03 ENCOUNTER — Ambulatory Visit: Payer: Medicare Other | Attending: Family Medicine | Admitting: Physical Therapy

## 2015-05-03 DIAGNOSIS — R29898 Other symptoms and signs involving the musculoskeletal system: Secondary | ICD-10-CM | POA: Insufficient documentation

## 2015-05-03 DIAGNOSIS — R197 Diarrhea, unspecified: Secondary | ICD-10-CM | POA: Diagnosis not present

## 2015-05-03 DIAGNOSIS — R293 Abnormal posture: Secondary | ICD-10-CM | POA: Diagnosis not present

## 2015-05-03 DIAGNOSIS — I82401 Acute embolism and thrombosis of unspecified deep veins of right lower extremity: Secondary | ICD-10-CM | POA: Diagnosis not present

## 2015-05-03 DIAGNOSIS — M25511 Pain in right shoulder: Secondary | ICD-10-CM

## 2015-05-03 DIAGNOSIS — Z86718 Personal history of other venous thrombosis and embolism: Secondary | ICD-10-CM | POA: Diagnosis not present

## 2015-05-03 DIAGNOSIS — I82451 Acute embolism and thrombosis of right peroneal vein: Secondary | ICD-10-CM

## 2015-05-03 NOTE — Therapy (Signed)
[facility] [address]  [place], [place], [address] Phone: [phone]   Fax:  [phone]  Physical Therapy Evaluation  Patient Details  Name: Heather [name] MRN: [id number] Date of Birth: January 05, 1935 Referring Provider: Karlton Lemon, MD  Encounter Date: 05/03/2015      PT End of Session - 05/03/15 1422    Visit Number 1   Number of Visits 12   Date for PT Re-Evaluation 06/14/15   PT Start Time 1346   Activity Tolerance Patient tolerated treatment well   Behavior During Therapy [facility] for tasks assessed/performed      Past Medical History  Diagnosis Date  . Coronary artery disease     post percutaneous coronary intervention in 2006  . Hyperlipidemia   . Restless legs     takes mirapex daily  . Fatigue   . Persistent disorder of initiating or maintaining sleep   . Dermatitis   . S/P inguinal herniorrhaphy   . DJD (degenerative joint disease) of knee 09/18/2011    knees- both, elbow- R, ankles   . Hypertension     takes medications daily  . GERD (gastroesophageal reflux disease)     prilosec daily  . H/O dizziness     pre-syncope/due to dehydration  . Occasional tremors   . Diverticulitis     hx of  . Gastric ulcer     hx of  . Angina   . Atrial fibrillation ([place])   . Dysrhythmia, cardiac     "palpitations"  . Borderline diabetes   . H/O hiatal hernia   . Stroke [facility]) 2000    TIA- series that have resolved-  . Hx of total knee replacement 11/12/11    left knee  . UTI (lower urinary tract infection) 10/04/2013  . Medicare annual wellness visit, subsequent 11/22/2013  . Hip pain 08/28/2011  . Diabetes mellitus without complication (HCC)     no meds  . Depression with anxiety 01/05/2012  . Diarrhea 04/17/2015  . Headache 04/17/2015    Past Surgical History  Procedure Laterality Date  . Abdominal hysterectomy    . Knee arthroscopy      2 on left and 1 on right  . Foot surgery      left foot  stress fracture repair  . Hernia repair      1989- esophageal hernia   . Upper gastrointestinal endoscopy  2013  . Total knee arthroplasty  11/12/2011    Procedure: TOTAL KNEE ARTHROPLASTY;  Surgeon: Lorn Junes, MD;  Location: [facility];  Service: Orthopedics;  Laterality: Left;  Dr Noemi Chapel wants 90 minutes for this case  . Coronary angioplasty  03/2005; 06/2005    "1" plus "1"= "2 total"  . Brain surgery      related to stroke   2000  . Joint replacement      left  . Total knee arthroplasty Right 11/29/2014    Procedure: RIGHT TOTAL KNEE ARTHROPLASTY;  Surgeon: Vickey Huger, MD;  Location: [facility];  Service: Orthopedics;  Laterality: Right;  . Eye surgery  11-16-13    cataract -right eye, left eye summer 2015    There were no vitals filed for this visit.  Visit Diagnosis:  Right shoulder pain - Plan: PT plan of care cert/re-cert  Abnormal posture - Plan: PT plan of care cert/re-cert  Weakness of right upper extremity - Plan: PT plan of care cert/re-cert      Subjective Assessment - 05/03/15 1349  Subjective Pt is a 80 y/o female who presents to OPPT with R shoulder pain x 2-3 months.  Pt states certain movements create sharp pains in upper trap and trapezius.     Pertinent History HTN, DM, depression with anxiety   Limitations Lifting;House hold activities   Patient Stated Goals decrease pain, perform UB dressing without pain   Currently in Pain? Yes   Pain Score 6    Pain Location Shoulder   Pain Orientation Right   Pain Descriptors / Indicators Aching;Constant;Stabbing   Pain Type Chronic pain   Pain Radiating Towards starts in ant/lateral neck and radiates to shoulder   Pain Onset More than a month ago   Pain Frequency Constant   Aggravating Factors  returning to standing from bending down; getting up off toilet   Pain Relieving Factors heat            [facility] PT Assessment - 05/03/15 1354    Assessment   Medical Diagnosis R shoulder pain   Referring Provider Karlton Lemon, MD   Onset Date/Surgical Date --  2-3 months   Hand Dominance Right   Next MD Visit 05/23/15   Prior Therapy none   Precautions   Precautions None   Restrictions   Weight Bearing Restrictions No   Balance Screen   Has the patient fallen in the past 6 months No   Has the patient had a decrease in activity level because of a fear of falling?  No   Is the patient reluctant to leave their home because of a fear of falling?  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   Overall Cognitive Status Within Functional Limits for tasks assessed   Observation/Other Assessments   Focus on Therapeutic Outcomes (FOTO)  55 (45% limited; predicted 35% limited)   Posture/Postural Control   Posture/Postural Control Postural limitations   Postural Limitations Rounded Shoulders;Forward head;Increased thoracic kyphosis   AROM   Overall AROM Comments bil shoulders WNL   Strength   Overall Strength Comments pain with R shoulder flexion/abduction/er   Strength Assessment Site Shoulder;Elbow;Hand   Right Shoulder Flexion 3+/5   Right Shoulder ABduction 3+/5   Right Shoulder Internal Rotation 5/5   Right Shoulder External Rotation 3+/5   Left Shoulder Flexion 4/5   Left Shoulder ABduction 4/5   Left Shoulder Internal Rotation 4/5   Left Shoulder External Rotation 4/5   Right/Left Elbow Right;Left   Right Elbow Flexion 4/5   Right Elbow Extension 5/5   Left Elbow Flexion 5/5   Left Elbow Extension 5/5   Palpation   Palpation comment tenderness along r upper trap, rhomboids and proximal biceps tendon   Special Tests    Special Tests Rotator Cuff Impingement   Rotator Cuff Impingment tests Empty Can test;Hawkins- Kennedy test   Hawkins-Kennedy test   Findings Negative   Side Right   Empty Can test   Findings Positive   Side Right   Comment pain with resistance                   [facility] Adult PT  Treatment/Exercise - 05/03/15 1354    Posture/Postural Control   Posture Comments R shoulder elevated with R scapular winging   Modalities   Modalities Electrical Stimulation;Moist Heat   Moist Heat Therapy   Number Minutes Moist Heat 15 Minutes   Moist Heat Location Shoulder   Electrical Stimulation  Electrical Stimulation Location R shoulder   Electrical Stimulation Action IFC   Electrical Stimulation Parameters to tolerance (62mA)   Electrical Stimulation Goals Pain                PT Education - May 15, 2015 1422    Education provided Yes   Education Details clinical findings, POC, goals   Person(s) Educated Patient   Methods Explanation   Comprehension Verbalized understanding             PT Long Term Goals - May 15, 2015 1427    PT LONG TERM GOAL #1   Title independent with HEP (06/14/15)   Time 6   Period Weeks   Status New   PT LONG TERM GOAL #2   Title report ability to perform UB dressing without pain (06/14/15)   Time 6   Period Weeks   Status New   PT LONG TERM GOAL #3   Title improve R shoulder strength to at least 4/5 for improved strength and function (06/14/15)   Time 6   Period Weeks   Status New               Plan - May 15, 2015 1423    Clinical Impression Statement Pt is a 80 y/o female who presents to OPPT with R shoulder pain likely due to trapezius strain.  Pt demonstrates poor posture and R shoulder weakness likely contributing to pain.  Pt will benefit from PT to address these deficits and return to pain free activity.   Pt will benefit from skilled therapeutic intervention in order to improve on the following deficits Postural dysfunction;Decreased strength;Impaired UE functional use;Pain   Rehab Potential Good   PT Frequency 2x / week   PT Duration 6 weeks  anticipate d/c in 4-6 weeks   PT Treatment/Interventions ADLs/Self Care Home Management;Electrical Stimulation;Cryotherapy;Iontophoresis 4mg /ml Dexamethasone;Moist Heat;Therapeutic  exercise;Therapeutic activities;Ultrasound;Neuromuscular re-education;Manual techniques;Vasopneumatic Device;Taping;Dry needling   PT Next Visit Plan pt to bring HEP from MD's office to review/update; modalities/dry needling.  scap stabilization and chest stretches   Consulted and Agree with Plan of Care Patient          G-Codes - 05-15-2015 1433    Functional Assessment Tool Used FOTO 45% limited   Functional Limitation Carrying, moving and handling objects   Carrying, Moving and Handling Objects Current Status (F8101) At least 40 percent but less than 60 percent impaired, limited or restricted   Carrying, Moving and Handling Objects Goal Status (B5102) At least 20 percent but less than 40 percent impaired, limited or restricted       Problem List Patient Active Problem List   Diagnosis Date Noted  . Right shoulder pain 04/29/2015  . Diarrhea 04/17/2015  . Headache 04/17/2015  . Multinodular goiter 01/17/2015  . Peroneal DVT (deep venous thrombosis) ([place]) 12/22/2014  . S/P total knee arthroplasty 11/29/2014  . Arthritis of knee, degenerative 11/19/2014  . Neck pain 10/29/2014  . Insomnia 02/05/2014  . Medicare annual wellness visit, subsequent 11/22/2013  . RLS (restless legs syndrome) 10/04/2013  . UTI (lower urinary tract infection) 10/04/2013  . Cataracts, bilateral 04/02/2013  . Leg swelling 01/17/2012  . Hypokalemia 01/05/2012  . Anemia 01/05/2012  . Depression with anxiety 01/05/2012  . Postoperative anemia due to acute blood loss 11/15/2011  . 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 Pre-syncope 10/13/2011  . DM (diabetes mellitus) ([place]) 10/06/2011  . Pulmonary nodule 10/06/2011  . Abdominal pain, other specified site 10/06/2011  . DJD (degenerative joint disease) of knee 09/18/2011  .  Hip pain 08/28/2011  . Renal insufficiency 08/28/2011  . CORONARY ATHEROSCLEROSIS NATIVE CORONARY ARTERY 03/07/2010  . Coronary atherosclerosis 01/18/2009  . FATIGUE 01/18/2009   . PERSISTENT DISORDER INITIATING/MAINTAINING SLEEP 09/09/2008  . DERMATITIS 10/16/2007  . PERIPHERAL EDEMA 10/16/2007  . PALPITATIONS 10/16/2007  . Hyperlipidemia 04/14/2007  . Essential hypertension 04/14/2007  . GERD 04/14/2007  . TRANSIENT ISCHEMIC ATTACK, HX OF 04/14/2007   Laureen Abrahams, PT, DPT 05/03/2015 2:35 PM  [facility] [address]  [place], [place], [address] Phone: [phone]   Fax:  [phone]  Name: Heather [name] MRN: [id number] Date of Birth: 03-17-1935

## 2015-05-04 ENCOUNTER — Ambulatory Visit (INDEPENDENT_AMBULATORY_CARE_PROVIDER_SITE_OTHER): Payer: Medicare Other | Admitting: Cardiovascular Disease

## 2015-05-04 ENCOUNTER — Encounter: Payer: Self-pay | Admitting: Cardiovascular Disease

## 2015-05-04 VITALS — BP 132/80 | HR 67 | Ht 63.0 in | Wt 153.0 lb

## 2015-05-04 DIAGNOSIS — E785 Hyperlipidemia, unspecified: Secondary | ICD-10-CM | POA: Diagnosis not present

## 2015-05-04 DIAGNOSIS — I6523 Occlusion and stenosis of bilateral carotid arteries: Secondary | ICD-10-CM | POA: Diagnosis not present

## 2015-05-04 DIAGNOSIS — I2510 Atherosclerotic heart disease of native coronary artery without angina pectoris: Secondary | ICD-10-CM | POA: Diagnosis not present

## 2015-05-04 DIAGNOSIS — I10 Essential (primary) hypertension: Secondary | ICD-10-CM

## 2015-05-04 LAB — C. DIFFICILE GDH AND TOXIN A/B

## 2015-05-04 LAB — FECAL LACTOFERRIN, QUANT: LACTOFERRIN: NEGATIVE

## 2015-05-04 LAB — OVA AND PARASITE EXAMINATION: OP: NONE SEEN

## 2015-05-04 MED ORDER — ASPIRIN EC 81 MG PO TBEC: 81.0000 mg | DELAYED_RELEASE_TABLET | Freq: Every day | ORAL | Status: AC

## 2015-05-04 NOTE — Patient Instructions (Signed)
Medication Instructions:  Your physician has recommended you make the following change in your medication:  1. START Aspirin 81mg  take one tablet by mouth daily  Labwork: No new orders.   Testing/Procedures: No new orders.   Follow-Up: Your physician wants you to follow-up in: 1 YEAR with Dr Burt Knack.  You will receive a reminder letter in the mail two months in advance. If you don't receive a letter, please call our office to schedule the follow-up appointment.   Any Other Special Instructions Will Be Listed Below (If Applicable).     If you need a refill on your cardiac medications before your next appointment, please call your pharmacy.

## 2015-05-04 NOTE — Progress Notes (Signed)
Cardiology Office Note Date:  05/06/2015   ID:  [name], Heather [name] 1934-08-20, MRN [id number]  PCP:  [name], MD  Cardiologist:  [name], MD    Chief Complaint  Patient presents with  . Coronary Artery Disease   History of Present Illness: Heather [name] is a 80 y.o. female who presents for follow-up of coronary artery disease. She underwent multivessel stenting in 2006. She has done very well since that time from a cardiac perspective.   The patient's antihypertensive medications have been reduced over the past few years and she reports good control of her blood pressure. She denies chest pain, shortness of breath, or leg swelling.  The patient had knee replacement surgery in May 2016. She had a postoperative DVT diagnosed the following month. She has been anticoagulated with Xarelto since that time. A follow-up ultrasound study was recently performed and demonstrated no residual DVT.  Past Medical History  Diagnosis Date  . Coronary artery disease     post percutaneous coronary intervention in 2006  . Hyperlipidemia   . Restless legs     takes mirapex daily  . Fatigue   . Persistent disorder of initiating or maintaining sleep   . Dermatitis   . S/P inguinal herniorrhaphy   . DJD (degenerative joint disease) of knee 09/18/2011    knees- both, elbow- R, ankles   . Hypertension     takes medications daily  . GERD (gastroesophageal reflux disease)     prilosec daily  . H/O dizziness     pre-syncope/due to dehydration  . Occasional tremors   . Diverticulitis     hx of  . Gastric ulcer     hx of  . Angina   . Atrial fibrillation ([place])   . Dysrhythmia, cardiac     "palpitations"  . Borderline diabetes   . H/O hiatal hernia   . Stroke [facility]) 2000    TIA- series that have resolved-  . Hx of total knee replacement 11/12/11    left knee  . UTI (lower urinary tract infection) 10/04/2013  . Medicare annual wellness visit, subsequent 11/22/2013  . Hip pain  08/28/2011  . Diabetes mellitus without complication (HCC)     no meds  . Depression with anxiety 01/05/2012  . Diarrhea 04/17/2015  . Headache 04/17/2015    Past Surgical History  Procedure Laterality Date  . Abdominal hysterectomy    . Knee arthroscopy      2 on left and 1 on right  . Foot surgery      left foot stress fracture repair  . Hernia repair      1989- esophageal hernia   . Upper gastrointestinal endoscopy  2013  . Total knee arthroplasty  11/12/2011    Procedure: TOTAL KNEE ARTHROPLASTY;  Surgeon: Lorn Junes, MD;  Location: [facility];  Service: Orthopedics;  Laterality: Left;  Dr Noemi Chapel wants 90 minutes for this case  . Coronary angioplasty  03/2005; 06/2005    "1" plus "1"= "2 total"  . Brain surgery      related to stroke   2000  . Joint replacement      left  . Total knee arthroplasty Right 11/29/2014    Procedure: RIGHT TOTAL KNEE ARTHROPLASTY;  Surgeon: Vickey Huger, MD;  Location: [facility];  Service: Orthopedics;  Laterality: Right;  . Eye surgery  11-16-13    cataract -right eye, left eye summer 2015    Current Outpatient Prescriptions  Medication Sig Dispense Refill  .  amLODipine (NORVASC) 10 MG tablet TAKE 1/2 TABLET EVERY DAY 45 tablet 1  . cholecalciferol (VITAMIN D) 1000 UNITS tablet Take 1,000 Units by mouth daily.    . isosorbide mononitrate (IMDUR) 60 MG 24 hr tablet Take 0.5 tablets (30 mg total) by mouth daily. 45 tablet 2  . losartan-hydrochlorothiazide (HYZAAR) 100-25 MG per tablet TAKE ONE TABLET BY MOUTH ONCE DAILY 90 tablet 3  . magnesium oxide (MAG-OX) 400 MG tablet Take 400 mg by mouth 2 (two) times daily.     . metFORMIN (GLUCOPHAGE) 500 MG tablet TAKE 1 TABLET (500 MG TOTAL) BY MOUTH DAILY WITH BREAKFAST. 90 tablet 1  . metoprolol succinate (TOPROL-XL) 50 MG 24 hr tablet TAKE ONE TABLET BY MOUTH TWICE DAILY IMMEDIATELY  FOLOWING  A  MEAL 180 tablet 2  . omeprazole (PRILOSEC) 20 MG capsule Take 20 mg by mouth 2 (two) times daily before a meal.    .  potassium chloride SA (K-DUR,KLOR-CON) 20 MEQ tablet Take 20 mEq by mouth 2 (two) times daily.    . pramipexole (MIRAPEX) 0.25 MG tablet Take 0.25 mg by mouth 2 (two) times daily.    . rivaroxaban (XARELTO) 20 MG TABS tablet Take 1 tablet (20 mg total) by mouth daily with supper. 30 tablet 1  . simvastatin (ZOCOR) 20 MG tablet TAKE ONE TABLET BY MOUTH AT BEDTIME 90 tablet 3  . aspirin EC 81 MG tablet Take 1 tablet (81 mg total) by mouth daily.    . Blood Glucose Monitoring Suppl (ONE TOUCH ULTRA SYSTEM KIT) W/DEVICE KIT 1 kit by Does not apply route once. DX: 250.00 Check sugars daily as needed 1 each 0  . glucose blood (ONE TOUCH TEST STRIPS) test strip Check BS daily and prn DX: E11.9 100 each 11  . ONETOUCH DELICA LANCETS FINE MISC Check sugars daily and prn  DX 250.00 100 each 1  . [DISCONTINUED] nitroGLYCERIN (NITROSTAT) 0.4 MG SL tablet Place 0.4 mg under the tongue every 5 (five) minutes as needed.       No current facility-administered medications for this visit.    Allergies:   Baclofen   Social History:  The patient  reports that she has never smoked. She has never used smokeless tobacco. She reports that she does not drink alcohol or use illicit drugs.   Family History:  The patient's family history includes Arthritis in an other family member; Cancer in her brother; Coronary artery disease in her mother; Depression in her brother; Diabetes in her brother and daughter; Heart disease in her mother; Hypertension in her daughter and another family member; Other in an other family member; Stroke in her brother and father; Thyroid disease in her daughter. There is no history of Breast cancer, Colon cancer, Anesthesia problems, Hypotension, Malignant hyperthermia, or Pseudochol deficiency.    ROS:  Please see the history of present illness.  Otherwise, review of systems is positive for  Hearing loss, visual disturbance, snoring.  All other systems are reviewed and negative.    PHYSICAL  EXAM: VS:  BP 132/80 mmHg  Pulse 67  Ht '5\' 3"'  (1.6 m)  Wt 153 lb (69.4 kg)  BMI 27.11 kg/m2 , BMI Body mass index is 27.11 kg/(m^2). GEN: Well nourished, well developed, in no acute distress HEENT: normal Neck: no JVD, no masses. No carotid bruits Cardiac: RRR without murmur or gallop                Respiratory:  clear to auscultation bilaterally, normal work of  breathing GI: soft, nontender, nondistended, + BS MS: no deformity or atrophy Ext: no pretibial edema, pedal pulses 2+= bilaterally Skin: warm and dry, no rash Neuro:  Strength and sensation are intact Psych: euthymic mood, full affect  EKG:  EKG is ordered today. The ekg ordered today shows  Sinus rhythm with occasional PVCs, heart rate 67 bpm, right bundle branch block , left anterior fascicular block. Since last tracing the patient has gone from an incomplete to a complete right bundle branch block.  Recent Labs: 05/20/2014: Magnesium 1.4* 04/05/2015: ALT 15; BUN 17; Creatinine, Ser 0.84; Hemoglobin 13.5; Platelets 185.0; Potassium 3.8; Sodium 138; TSH 1.29   Lipid Panel     Component Value Date/Time   CHOL 151 04/05/2015 1235   TRIG 153.0* 04/05/2015 1235   HDL 59.90 04/05/2015 1235   CHOLHDL 3 04/05/2015 1235   VLDL 30.6 04/05/2015 1235   LDLCALC 60 04/05/2015 1235   LDLDIRECT 73.4 05/20/2014 1651      Wt Readings from Last 3 Encounters:  05/04/15 153 lb (69.4 kg)  04/26/15 154 lb (69.854 kg)  04/05/15 154 lb 6 oz (70.024 kg)     Cardiac Studies Reviewed:  venous duplex May 13, 2015: IMPRESSION: 1. No evidence of deep venous thrombosis. 2. Interval resolution of previously noted calf DVT.  ASSESSMENT AND PLAN: 1.   CAD, native vessel, without symptoms of angina: Recommended that the patient resume aspirin as she is stopping Xarelto in the near future. Otherwise continue current medical program.  2. Postoperative DVT: follow-up duplex scan showed no evidence of residual DVT. Suspect she will be stopping  Xarelto in the near future as she is now about 6 months out from diagnosis which occurred in the postoperative setting.  3. Hyperlipidemia: Lipids reviewed as above and at goal with an LDL of 60 on a statin drug area  4. Essential hypertension: Blood pressure well controlled on current medical therapy.  Current medicines are reviewed with the patient today.  The patient does not have concerns regarding medicines.  Labs/ tests ordered today include:   Orders Placed This Encounter  Procedures  . EKG 12-Lead    Disposition:   FU one year  Signed, [name], MD  05/06/2015 9:55 PM    [place], [place], [place]  [address] Phone: [phone]; Fax: [phone]

## 2015-05-06 ENCOUNTER — Encounter: Payer: Self-pay | Admitting: Cardiovascular Disease

## 2015-05-10 ENCOUNTER — Ambulatory Visit: Payer: Medicare Other | Attending: Family Medicine | Admitting: Physical Therapy

## 2015-05-10 DIAGNOSIS — R29898 Other symptoms and signs involving the musculoskeletal system: Secondary | ICD-10-CM | POA: Diagnosis not present

## 2015-05-10 DIAGNOSIS — M25511 Pain in right shoulder: Secondary | ICD-10-CM

## 2015-05-10 DIAGNOSIS — R293 Abnormal posture: Secondary | ICD-10-CM

## 2015-05-10 NOTE — Therapy (Signed)
[facility] [address]  [place], [place], [address] Phone: [phone]   Fax:  [phone]  Physical Therapy Treatment  Patient Details  Name: Heather [name] MRN: [id number] Date of Birth: April 29, 1935 Referring Provider: Karlton Lemon, MD  Encounter Date: 05/10/2015      PT End of Session - 05/10/15 1430    Visit Number 2   Number of Visits 12   Date for PT Re-Evaluation 06/14/15   PT Start Time 4403   PT Stop Time 1441   PT Time Calculation (min) 54 min   Activity Tolerance Patient tolerated treatment well   Behavior During Therapy [facility] for tasks assessed/performed      Past Medical History  Diagnosis Date  . Coronary artery disease     post percutaneous coronary intervention in 2006  . Hyperlipidemia   . Restless legs     takes mirapex daily  . Fatigue   . Persistent disorder of initiating or maintaining sleep   . Dermatitis   . S/P inguinal herniorrhaphy   . DJD (degenerative joint disease) of knee 09/18/2011    knees- both, elbow- R, ankles   . Hypertension     takes medications daily  . GERD (gastroesophageal reflux disease)     prilosec daily  . H/O dizziness     pre-syncope/due to dehydration  . Occasional tremors   . Diverticulitis     hx of  . Gastric ulcer     hx of  . Angina   . Atrial fibrillation ([place])   . Dysrhythmia, cardiac     "palpitations"  . Borderline diabetes   . H/O hiatal hernia   . Stroke [facility]) 2000    TIA- series that have resolved-  . Hx of total knee replacement 11/12/11    left knee  . UTI (lower urinary tract infection) 10/04/2013  . Medicare annual wellness visit, subsequent 11/22/2013  . Hip pain 08/28/2011  . Diabetes mellitus without complication (HCC)     no meds  . Depression with anxiety 01/05/2012  . Diarrhea 04/17/2015  . Headache 04/17/2015    Past Surgical History  Procedure Laterality Date  . Abdominal hysterectomy    . Knee arthroscopy      2 on  left and 1 on right  . Foot surgery      left foot stress fracture repair  . Hernia repair      1989- esophageal hernia   . Upper gastrointestinal endoscopy  2013  . Total knee arthroplasty  11/12/2011    Procedure: TOTAL KNEE ARTHROPLASTY;  Surgeon: Lorn Junes, MD;  Location: [facility];  Service: Orthopedics;  Laterality: Left;  Dr Noemi Chapel wants 90 minutes for this case  . Coronary angioplasty  03/2005; 06/2005    "1" plus "1"= "2 total"  . Brain surgery      related to stroke   2000  . Joint replacement      left  . Total knee arthroplasty Right 11/29/2014    Procedure: RIGHT TOTAL KNEE ARTHROPLASTY;  Surgeon: Vickey Huger, MD;  Location: [facility];  Service: Orthopedics;  Laterality: Right;  . Eye surgery  11-16-13    cataract -right eye, left eye summer 2015    There were no vitals filed for this visit.  Visit Diagnosis:  Right shoulder pain  Abnormal posture  Weakness of right upper extremity      Subjective Assessment - 05/10/15 1351    Subjective Feels like R  shoulder is feeling better.  No episodes of sharp pain.   Pertinent History HTN, DM, depression with anxiety   Limitations Lifting;House hold activities   Patient Stated Goals decrease pain, perform UB dressing without pain   Currently in Pain? 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   Modalities   Modalities Electrical Stimulation;Moist Heat   Moist Heat Therapy   Number Minutes Moist Heat 15 Minutes   Moist Heat Location Shoulder   Electrical Stimulation   Electrical Stimulation Location R shoulder    Electrical Stimulation Action IFC   Electrical Stimulation Parameters to tolerance   Electrical Stimulation Goals Pain   Manual Therapy   Manual Therapy Soft tissue mobilization   Soft tissue mobilization R UT with trigger point release                     PT Long Term Goals - 05/10/15 1432    PT LONG TERM GOAL #1   Title independent with HEP (06/14/15)   Status On-going   PT LONG TERM GOAL #2   Title report ability to perform UB dressing without pain (06/14/15)   Status On-going   PT LONG TERM GOAL #3   Title improve R shoulder strength to at least 4/5 for improved strength and function (06/14/15)   Status On-going               Plan - 05/10/15 1431    Clinical Impression Statement Pt reports no episodes of sharp pain since last session, but did experience some pain with scap retraction.  Advised on proper form and pt able to continue without increase in pain.   PT Next Visit Plan pt to bring HEP from MD's office to review/update; modalities/dry needling.  scap stabilization and chest stretches   PT Home Exercise Plan doorway strech; scap retraction with green tband   Consulted and Agree with Plan of Care Patient        Problem List Patient Active Problem List   Diagnosis Date Noted  . Right shoulder pain 04/29/2015  . Diarrhea 04/17/2015  . Headache 04/17/2015  . Multinodular goiter 01/17/2015  . Peroneal DVT (deep venous thrombosis) ([place]) 12/22/2014  . S/P total knee arthroplasty 11/29/2014  . Arthritis of knee, degenerative 11/19/2014  . Neck pain 10/29/2014  . Insomnia 02/05/2014  . Medicare annual wellness visit, subsequent 11/22/2013  . RLS (restless legs syndrome) 10/04/2013  . UTI (lower urinary tract infection) 10/04/2013  . Cataracts, bilateral 04/02/2013  . Leg swelling 01/17/2012  . Hypokalemia 01/05/2012  . Anemia 01/05/2012  . Depression with anxiety 01/05/2012  . Postoperative anemia due to acute blood loss 11/15/2011  .  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 Pre-syncope 10/13/2011  . DM (diabetes mellitus) ([place]) 10/06/2011  . Pulmonary nodule 10/06/2011  . Abdominal pain, other specified site 10/06/2011  . DJD (degenerative joint disease) of knee 09/18/2011  . Hip pain 08/28/2011  . Renal insufficiency 08/28/2011  . CORONARY ATHEROSCLEROSIS NATIVE CORONARY  ARTERY 03/07/2010  . Coronary atherosclerosis 01/18/2009  . FATIGUE 01/18/2009  . PERSISTENT DISORDER INITIATING/MAINTAINING SLEEP 09/09/2008  . DERMATITIS 10/16/2007  . PERIPHERAL EDEMA 10/16/2007  . PALPITATIONS 10/16/2007  . Hyperlipidemia 04/14/2007  . Essential hypertension 04/14/2007  . GERD 04/14/2007  . TRANSIENT ISCHEMIC ATTACK, HX OF 04/14/2007   Laureen Abrahams, PT, DPT 05/10/2015 2:33 PM  [facility] [address]  [place], [place], [address] Phone: [phone]   Fax:  [phone]  Name: Heather [name] MRN: [id number] Date of Birth: 1935/04/07

## 2015-05-10 NOTE — Patient Instructions (Signed)
Scapular Retraction: Bilateral    Facing anchor, pull arms back, bringing shoulder blades together. Repeat _15___ times per set. Do __1__ sets per session. Do __1-2__ sessions per day.  http://orth.exer.us/177   Copyright  VHI. All rights reserved.

## 2015-05-11 ENCOUNTER — Ambulatory Visit: Payer: Medicare Other | Admitting: Physical Therapy

## 2015-05-11 DIAGNOSIS — M25511 Pain in right shoulder: Secondary | ICD-10-CM | POA: Diagnosis not present

## 2015-05-11 DIAGNOSIS — R29898 Other symptoms and signs involving the musculoskeletal system: Secondary | ICD-10-CM

## 2015-05-11 DIAGNOSIS — R293 Abnormal posture: Secondary | ICD-10-CM

## 2015-05-11 NOTE — Therapy (Signed)
[facility] [address]  [place], [place], [address] Phone: [phone]   Fax:  [phone]  Physical Therapy Treatment  Patient Details  Name: Heather [name] MRN: [id number] Date of Birth: [birth year] Referring Provider: Karlton Lemon, MD  Encounter Date: 05/11/2015      PT End of Session - 05/11/15 0727    Visit Number 3   Number of Visits 12   Date for PT Re-Evaluation 06/14/15   PT Start Time 0721   PT Stop Time 0832   PT Time Calculation (min) 71 min      Past Medical History  Diagnosis Date  . Coronary artery disease     post percutaneous coronary intervention in 2006  . Hyperlipidemia   . Restless legs     takes mirapex daily  . Fatigue   . Persistent disorder of initiating or maintaining sleep   . Dermatitis   . S/P inguinal herniorrhaphy   . DJD (degenerative joint disease) of knee 09/18/2011    knees- both, elbow- R, ankles   . Hypertension     takes medications daily  . GERD (gastroesophageal reflux disease)     prilosec daily  . H/O dizziness     pre-syncope/due to dehydration  . Occasional tremors   . Diverticulitis     hx of  . Gastric ulcer     hx of  . Angina   . Atrial fibrillation ([place])   . Dysrhythmia, cardiac     "palpitations"  . Borderline diabetes   . H/O hiatal hernia   . Stroke [facility]) 2000    TIA- series that have resolved-  . Hx of total knee replacement 11/12/11    left knee  . UTI (lower urinary tract infection) 10/04/2013  . Medicare annual wellness visit, subsequent 11/22/2013  . Hip pain 08/28/2011  . Diabetes mellitus without complication (HCC)     no meds  . Depression with anxiety 01/05/2012  . Diarrhea 04/17/2015  . Headache 04/17/2015    Past Surgical History  Procedure Laterality Date  . Abdominal hysterectomy    . Knee arthroscopy      2 on left and 1 on right  . Foot surgery      left foot stress fracture repair  . Hernia repair      1989-  esophageal hernia   . Upper gastrointestinal endoscopy  2013  . Total knee arthroplasty  11/12/2011    Procedure: TOTAL KNEE ARTHROPLASTY;  Surgeon: Lorn Junes, MD;  Location: [facility];  Service: Orthopedics;  Laterality: Left;  Dr Noemi Chapel wants 90 minutes for this case  . Coronary angioplasty  03/2005; 06/2005    "1" plus "1"= "2 total"  . Brain surgery      related to stroke   2000  . Joint replacement      left  . Total knee arthroplasty Right 11/29/2014    Procedure: RIGHT TOTAL KNEE ARTHROPLASTY;  Surgeon: Vickey Huger, MD;  Location: [facility];  Service: Orthopedics;  Laterality: Right;  . Eye surgery  11-16-13    cataract -right eye, left eye summer 2015    There were no vitals filed for this visit.  Visit Diagnosis:  Right shoulder pain  Abnormal posture  Weakness of right upper extremity      Subjective Assessment - 05/11/15 0727    Subjective pt states she has been in increased pain since yesterday's PT treatment.  She states she was feeling good prior  to yesterday's treatment but noted onset of pain with performing low row exercise and states pain has persisted since that time.  She states she had difficulty sleeping due to pain and rates pain 6/10 currently.   Currently in Pain? Yes   Pain Score 6    Pain Location Shoulder   Pain Orientation Right   Pain Radiating Towards throughout R upper scapular muscles especially along spine of scapula - denies pain extending into UE, denies N/T.   Aggravating Factors  R side-lying, sit->stand transfers (especially low commode), lifting objects from floor/forward bending          TODAY'S TREATMENT Assessed pt for possible dry needling benefit then discussed treatment modality with pt.  She opted to not pursue this modality today. Manual - L side-lying positional release to R Upper Trap and Levator followed by TPR and some STM to same.  R Scapular mobes to pt tolerance (notes most pain with scapular retraction). IFC (80-150Hz ) with  MHP to R upper scapular mms in L side-lying x 15'            PT Long Term Goals - 05/10/15 1432    PT LONG TERM GOAL #1   Title independent with HEP (06/14/15)   Status On-going   PT LONG TERM GOAL #2   Title report ability to perform UB dressing without pain (06/14/15)   Status On-going   PT LONG TERM GOAL #3   Title improve R shoulder strength to at least 4/5 for improved strength and function (06/14/15)   Status On-going               Plan - 05/11/15 1696    Clinical Impression Statement Pt seen by this PT for possible Dry Needling today.  Following assessment for potential benefit and discussion regarding this treatment modality with pt, she opted to hold off on it for now.  Current symptoms seem as though would benefit from dry needling and pt advised of this and she is to inform us if she'd like to try this modality in the future.  Following assessment and discussion, today's treatment focuse on tissue relaxation / desensitization to R upper trap and levator.  Pt's pain increases with scapular retraction motions so no exercises performed today.  She noted some pain reduction following manual as well as following e-stim with heat.   PT Next Visit Plan manual and modalities for pain; scapular mobility and stability exercises to tolerance.   Consulted and Agree with Plan of Care Patient        Problem List Patient Active Problem List   Diagnosis Date Noted  . Right shoulder pain 04/29/2015  . Diarrhea 04/17/2015  . Headache 04/17/2015  . Multinodular goiter 01/17/2015  . Peroneal DVT (deep venous thrombosis) ([place]) 12/22/2014  . S/P total knee arthroplasty 11/29/2014  . Arthritis of knee, degenerative 11/19/2014  . Neck pain 10/29/2014  . Insomnia 02/05/2014  . Medicare annual wellness visit, subsequent 11/22/2013  . RLS (restless legs syndrome) 10/04/2013  . UTI (lower urinary tract infection) 10/04/2013  . Cataracts, bilateral 04/02/2013  . Leg swelling  01/17/2012  . Hypokalemia 01/05/2012  . Anemia 01/05/2012  . Depression with anxiety 01/05/2012  . Postoperative anemia due to acute blood loss 11/15/2011  . 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 Pre-syncope 10/13/2011  . DM (diabetes mellitus) ([place]) 10/06/2011  . Pulmonary nodule 10/06/2011  . Abdominal pain, other specified site 10/06/2011  . DJD (degenerative joint disease) of knee 09/18/2011  . Hip  pain 08/28/2011  . Renal insufficiency 08/28/2011  . CORONARY ATHEROSCLEROSIS NATIVE CORONARY ARTERY 03/07/2010  . Coronary atherosclerosis 01/18/2009  . FATIGUE 01/18/2009  . PERSISTENT DISORDER INITIATING/MAINTAINING SLEEP 09/09/2008  . DERMATITIS 10/16/2007  . PERIPHERAL EDEMA 10/16/2007  . PALPITATIONS 10/16/2007  . Hyperlipidemia 04/14/2007  . Essential hypertension 04/14/2007  . GERD 04/14/2007  . TRANSIENT ISCHEMIC ATTACK, HX OF 04/14/2007    Felix Pratt  PT, OCS  05/11/2015, 8:31 AM  [facility] [address]  [place], [place], [address] Phone: [phone]   Fax:  [phone]  Name: Heather [name] MRN: [id number] Date of Birth: 10/09/1934

## 2015-05-12 ENCOUNTER — Ambulatory Visit: Payer: Medicare Other | Admitting: Physical Therapy

## 2015-05-13 ENCOUNTER — Telehealth: Payer: Self-pay | Admitting: *Deleted

## 2015-05-13 NOTE — Telephone Encounter (Signed)
Received call from [facility] lab stating that they will not be able to run the stool c-diff.  The [facility] lab tech stated that they received the stool containers but the stool container for the c-diff was lost between departments.   All the other stool test will be ran.  Please advise.//AB/CMA

## 2015-05-13 NOTE — Telephone Encounter (Signed)
Patient needs CDiff done they need to call and resubmit

## 2015-05-16 NOTE — Telephone Encounter (Signed)
Called and spoke with the pt and she agreed to resubmit the stool for the c-diff.  Pt stated that she has an appt with Dr. [name] on Tues. (05/24/15) and she will pick-up the stool bottles at the appt..//AB/CMA

## 2015-05-17 ENCOUNTER — Ambulatory Visit: Payer: Medicare Other

## 2015-05-24 ENCOUNTER — Ambulatory Visit (INDEPENDENT_AMBULATORY_CARE_PROVIDER_SITE_OTHER): Payer: Medicare Other | Admitting: Family Medicine

## 2015-05-24 ENCOUNTER — Encounter: Payer: Self-pay | Admitting: Family Medicine

## 2015-05-24 VITALS — BP 163/58 | HR 59 | Temp 98.0°F | Ht 63.0 in | Wt 157.2 lb

## 2015-05-24 DIAGNOSIS — Z23 Encounter for immunization: Secondary | ICD-10-CM | POA: Diagnosis not present

## 2015-05-24 DIAGNOSIS — E118 Type 2 diabetes mellitus with unspecified complications: Secondary | ICD-10-CM

## 2015-05-24 DIAGNOSIS — I6523 Occlusion and stenosis of bilateral carotid arteries: Secondary | ICD-10-CM | POA: Diagnosis not present

## 2015-05-24 DIAGNOSIS — M79643 Pain in unspecified hand: Secondary | ICD-10-CM

## 2015-05-24 DIAGNOSIS — R300 Dysuria: Secondary | ICD-10-CM | POA: Diagnosis not present

## 2015-05-24 DIAGNOSIS — N390 Urinary tract infection, site not specified: Secondary | ICD-10-CM

## 2015-05-24 DIAGNOSIS — R002 Palpitations: Secondary | ICD-10-CM

## 2015-05-24 DIAGNOSIS — R197 Diarrhea, unspecified: Secondary | ICD-10-CM | POA: Diagnosis not present

## 2015-05-24 DIAGNOSIS — I10 Essential (primary) hypertension: Secondary | ICD-10-CM

## 2015-05-24 DIAGNOSIS — M79609 Pain in unspecified limb: Secondary | ICD-10-CM | POA: Diagnosis not present

## 2015-05-24 LAB — RHEUMATOID FACTOR: Rhuematoid fact SerPl-aCnc: <10 IU/mL (ref <=14)

## 2015-05-24 MED ORDER — LOSARTAN POTASSIUM 100 MG PO TABS: 100.0000 mg | ORAL_TABLET | Freq: Every day | ORAL | Status: DC

## 2015-05-24 MED ORDER — TRIAMTERENE-HCTZ 37.5-25 MG PO TABS: 1.0000 | ORAL_TABLET | Freq: Every day | ORAL | Status: DC

## 2015-05-24 NOTE — Progress Notes (Signed)
Pre visit review using our clinic review tool, if applicable. No additional management support is needed unless otherwise documented below in the visit note. 

## 2015-05-24 NOTE — Patient Instructions (Signed)
[facility]  Ganglion Cyst A ganglion cyst is a noncancerous, fluid-filled lump that occurs near joints or tendons. The ganglion cyst grows out of a joint or the lining of a tendon. It most often develops in the hand or wrist, but it can also develop in the shoulder, elbow, hip, knee, ankle, or foot. The round or oval ganglion cyst can be the size of a pea or larger than a grape. Increased activity may enlarge the size of the cyst because more fluid starts to build up.  CAUSES It is not known what causes a ganglion cyst to grow. However, it may be related to:  Inflammation or irritation around the joint.  An injury.  Repetitive movements or overuse.  Arthritis. RISK FACTORS Risk factors include:  Being a woman.  Being age 22-50. SIGNS AND SYMPTOMS Symptoms may include:   A lump. This most often appears on the hand or wrist, but it can occur in other areas of the body.  Tingling.  Pain.  Numbness.  Muscle weakness.  Weak grip.  Less movement in a joint. DIAGNOSIS Ganglion cysts are most often diagnosed based on a physical exam. Your health care provider will feel the lump and may shine a light alongside it. If it is a ganglion cyst, a light often shines through it. Your health care provider may order an X-ray, ultrasound, or MRI to rule out other conditions. TREATMENT Ganglion cysts usually go away on their own without treatment. If pain or other symptoms are involved, treatment may be needed. Treatment is also needed if the ganglion cyst limits your movement or if it gets infected. Treatment may include:  Wearing a brace or splint on your wrist or finger.  Taking anti-inflammatory medicine.  Draining fluid from the lump with a needle (aspiration).  Injecting a steroid into the joint.  Surgery to remove the ganglion cyst. HOME CARE INSTRUCTIONS  Do not press on the ganglion cyst, poke it with a needle, or hit it.  Take medicines only as directed  by your health care provider.  Wear your brace or splint as directed by your health care provider.  Watch your ganglion cyst for any changes.  Keep all follow-up visits as directed by your health care provider. This is important. SEEK MEDICAL CARE IF:  Your ganglion cyst becomes larger or more painful.  You have increased redness, red streaks, or swelling.  You have pus coming from the lump.  You have weakness or numbness in the affected area.  You have a fever or chills.   This information is not intended to replace advice given to you by your health care provider. Make sure you discuss any questions you have with your health care provider.   Document Released: 06/22/2000 Document Revised: 07/16/2014 Document Reviewed: 12/08/2013 Elsevier Interactive Patient Education 2016 [place] Carbohydrate Counting for Diabetes Mellitus Carbohydrate counting is a method for keeping track of the amount of carbohydrates you eat. Eating carbohydrates naturally increases the level of sugar (glucose) in your blood, so it is important for you to know the amount that is okay for you to have in every meal. Carbohydrate counting helps keep the level of glucose in your blood within normal limits. The amount of carbohydrates allowed is different for every person. A dietitian can help you calculate the amount that is right for you. Once you know the amount of carbohydrates you can have, you can count the carbohydrates in the foods you want to eat. Carbohydrates  are found in the following foods:  Grains, such as breads and cereals.  Starchy vegetables, such as potatoes, peas, and corn.  Fruit and fruit juices.  Milk and yogurt.  Sweets and snack foods, such as cake, cookies, candy, chips, soft drinks, and fruit drinks. CARBOHYDRATE COUNTING There are two ways to count the carbohydrates in your food. You can use either of the methods or a combination of both. Reading the "Nutrition Facts"  on [place] The "Nutrition Facts" is an area that is included on the labels of almost all packaged food and beverages in the [place]. It includes the serving size of that food or beverage and information about the nutrients in each serving of the food, including the grams (g) of carbohydrate per serving.  Decide the number of servings of this food or beverage that you will be able to eat or drink. Multiply that number of servings by the number of grams of carbohydrate that is listed on the label for that serving. The total will be the amount of carbohydrates you will be having when you eat or drink this food or beverage. Learning Standard Serving Sizes of Food When you eat food that is not packaged or does not include "Nutrition Facts" on the label, you need to measure the servings in order to count the amount of carbohydrates.A serving of most carbohydrate-rich foods contains about 15 g of carbohydrates. The following list includes serving sizes of carbohydrate-rich foods that provide 15 g ofcarbohydrate per serving:   1 slice of bread (1 oz) or 1 six-inch tortilla.    of a hamburger bun or English muffin.  4-6 crackers.   cup unsweetened dry cereal.    cup hot cereal.   cup rice or pasta.    cup mashed potatoes or  of a large baked potato.  1 cup fresh fruit or one small piece of fruit.    cup canned or frozen fruit or fruit juice.  1 cup milk.   cup plain fat-free yogurt or yogurt sweetened with artificial sweeteners.   cup cooked dried beans or starchy vegetable, such as peas, corn, or potatoes.  Decide the number of standard-size servings that you will eat. Multiply that number of servings by 15 (the grams of carbohydrates in that serving). For example, if you eat 2 cups of strawberries, you will have eaten 2 servings and 30 g of carbohydrates (2 servings x 15 g = 30 g). For foods such as soups and casseroles, in which more than one food is mixed in, you  will need to count the carbohydrates in each food that is included. EXAMPLE OF CARBOHYDRATE COUNTING Sample Dinner  3 oz chicken breast.   cup of brown rice.   cup of corn.  1 cup milk.   1 cup strawberries with sugar-free whipped topping.  Carbohydrate Calculation Step 1: Identify the foods that contain carbohydrates:   Rice.   Corn.   Milk.   Strawberries. Step 2:Calculate the number of servings eaten of each:   2 servings of rice.   1 serving of corn.   1 serving of milk.   1 serving of strawberries. Step 3: Multiply each of those number of servings by 15 g:   2 servings of rice x 15 g = 30 g.   1 serving of corn x 15 g = 15 g.   1 serving of milk x 15 g = 15 g.   1 serving of strawberries x 15 g = 15  g. Step 4: Add together all of the amounts to find the total grams of carbohydrates eaten: 30 g + 15 g + 15 g + 15 g = 75 g.   This information is not intended to replace advice given to you by your health care provider. Make sure you discuss any questions you have with your health care provider.   Document Released: 06/25/2005 Document Revised: 07/16/2014 Document Reviewed: 05/22/2013 Elsevier Interactive Patient Education [employer].

## 2015-05-25 ENCOUNTER — Other Ambulatory Visit: Payer: Self-pay

## 2015-05-25 ENCOUNTER — Ambulatory Visit: Payer: Medicare Other | Admitting: Physical Therapy

## 2015-05-25 ENCOUNTER — Ambulatory Visit (INDEPENDENT_AMBULATORY_CARE_PROVIDER_SITE_OTHER): Payer: Medicare Other | Admitting: Family Medicine

## 2015-05-25 ENCOUNTER — Encounter: Payer: Self-pay | Admitting: Family Medicine

## 2015-05-25 VITALS — BP 152/68 | HR 63 | Ht 63.0 in | Wt 153.0 lb

## 2015-05-25 DIAGNOSIS — R29898 Other symptoms and signs involving the musculoskeletal system: Secondary | ICD-10-CM | POA: Diagnosis not present

## 2015-05-25 DIAGNOSIS — R293 Abnormal posture: Secondary | ICD-10-CM | POA: Diagnosis not present

## 2015-05-25 DIAGNOSIS — M25511 Pain in right shoulder: Secondary | ICD-10-CM

## 2015-05-25 DIAGNOSIS — I6523 Occlusion and stenosis of bilateral carotid arteries: Secondary | ICD-10-CM

## 2015-05-25 DIAGNOSIS — Z1231 Encounter for screening mammogram for malignant neoplasm of breast: Secondary | ICD-10-CM

## 2015-05-25 LAB — URINALYSIS
BILIRUBIN URINE: NEGATIVE
HGB URINE DIPSTICK: NEGATIVE
Ketones, ur: NEGATIVE
LEUKOCYTES UA: NEGATIVE
NITRITE: NEGATIVE
Specific Gravity, Urine: 1.015 (ref 1.000–1.030)
Total Protein, Urine: NEGATIVE
Urine Glucose: NEGATIVE
Urobilinogen, UA: 0.2 (ref 0.0–1.0)
pH: 5.5 (ref 5.0–8.0)

## 2015-05-25 LAB — URINE CULTURE
Colony Count: NO GROWTH
ORGANISM ID, BACTERIA: NO GROWTH

## 2015-05-25 LAB — SEDIMENTATION RATE: SED RATE: 13 mm/h (ref 0–22)

## 2015-05-25 LAB — ANA: Anti Nuclear Antibody(ANA): NEGATIVE

## 2015-05-25 NOTE — Patient Instructions (Signed)
Make today your last physical therapy visit. Follow their instructions regarding home exercises. You should be in the clear now though given your exam, how much you've improved. Call me if you have any problems going forward.

## 2015-05-25 NOTE — Therapy (Addendum)
[facility] [address]  [place], [place], [address] Phone: [phone]   Fax:  [phone]  Physical Therapy Treatment  Patient Details  Name: RENNE [name] MRN: [id number] Date of Birth: [birth year] Referring Provider: Karlton Lemon, MD  Encounter Date: 05/25/2015      PT End of Session - 05/25/15 1500    Visit Number 4   Number of Visits 12   Date for PT Re-Evaluation 06/14/15   PT Start Time 0932   PT Stop Time 1520   PT Time Calculation (min) 28 min      Past Medical History  Diagnosis Date  . Coronary artery disease     post percutaneous coronary intervention in 2006  . Hyperlipidemia   . Restless legs     takes mirapex daily  . Fatigue   . Persistent disorder of initiating or maintaining sleep   . Dermatitis   . S/P inguinal herniorrhaphy   . DJD (degenerative joint disease) of knee 09/18/2011    knees- both, elbow- R, ankles   . Hypertension     takes medications daily  . GERD (gastroesophageal reflux disease)     prilosec daily  . H/O dizziness     pre-syncope/due to dehydration  . Occasional tremors   . Diverticulitis     hx of  . Gastric ulcer     hx of  . Angina   . Atrial fibrillation ([place])   . Dysrhythmia, cardiac     "palpitations"  . Borderline diabetes   . H/O hiatal hernia   . Stroke [facility]) 2000    TIA- series that have resolved-  . Hx of total knee replacement 11/12/11    left knee  . UTI (lower urinary tract infection) 10/04/2013  . Medicare annual wellness visit, subsequent 11/22/2013  . Hip pain 08/28/2011  . Diabetes mellitus without complication (HCC)     no meds  . Depression with anxiety 01/05/2012  . Diarrhea 04/17/2015  . Headache 04/17/2015    Past Surgical History  Procedure Laterality Date  . Abdominal hysterectomy    . Knee arthroscopy      2 on left and 1 on right  . Foot surgery      left foot stress fracture repair  . Hernia repair      1989-  esophageal hernia   . Upper gastrointestinal endoscopy  2013  . Total knee arthroplasty  11/12/2011    Procedure: TOTAL KNEE ARTHROPLASTY;  Surgeon: Lorn Junes, MD;  Location: [facility];  Service: Orthopedics;  Laterality: Left;  Dr Noemi Chapel wants 90 minutes for this case  . Coronary angioplasty  03/2005; 06/2005    "1" plus "1"= "2 total"  . Brain surgery      related to stroke   2000  . Joint replacement      left  . Total knee arthroplasty Right 11/29/2014    Procedure: RIGHT TOTAL KNEE ARTHROPLASTY;  Surgeon: Vickey Huger, MD;  Location: [facility];  Service: Orthopedics;  Laterality: Right;  . Eye surgery  11-16-13    cataract -right eye, left eye summer 2015    There were no vitals filed for this visit.  Visit Diagnosis:  Right shoulder pain  Abnormal posture  Weakness of right upper extremity      Subjective Assessment - 05/25/15 1456    Subjective states is pain-free today and hasn't noted any pain for past few days.  No longer notes pain with sit->stand  from commode or with upper body dressing.  States is not limiting sleep but states has great difficulty sleeping but this is chronic in nature.   Currently in Pain? No/denies            [facility] PT Assessment - 05/25/15 0001    Strength   Right Shoulder Flexion 4+/5   Right Shoulder ABduction 4+/5   Right Shoulder Internal Rotation 4+/5   Right Shoulder External Rotation 4+/5   Left Shoulder Flexion 4+/5   Left Shoulder ABduction 4+/5   Left Shoulder Internal Rotation 4+/5   Left Shoulder External Rotation 4+/5   Right Elbow Flexion 5/5   Hawkins-Kennedy test   Findings Negative   Empty Can test   Findings Negative            TODAY'S TREATMENT B Shoulder AROM and MMT  TherEx - Seated Low Row Black TB 10x Seated one-hand low row black TB 10x (Added to HEP)  Reviewed current HEP and advised pt if her pain returns to contact us and that if we don't hear from her we will discharge her in 30  days.              PT Long Term Goals - 05/25/15 1557    PT LONG TERM GOAL #1   Title independent with HEP (06/14/15)   Status Achieved   PT LONG TERM GOAL #2   Title report ability to perform UB dressing without pain (06/14/15)   Status Achieved   PT LONG TERM GOAL #3   Title improve R shoulder strength to at least 4/5 for improved strength and function (06/14/15)   Status Achieved               Plan - 05/25/15 1555    Clinical Impression Statement pt states she has been pain-free lately and feels as though is fine. Special Testing today was normal.  AROM and MMT to B UE basically equal and without c/o pain.  Due to current status pt is being placed on hold from PT in case symptoms return.    PT Next Visit Plan on hold up to 30 days (06/24/15)   Consulted and Agree with Plan of Care Patient        Problem List Patient Active Problem List   Diagnosis Date Noted  . Right shoulder pain 04/29/2015  . Diarrhea 04/17/2015  . Headache 04/17/2015  . Multinodular goiter 01/17/2015  . Peroneal DVT (deep venous thrombosis) ([place]) 12/22/2014  . S/P total knee arthroplasty 11/29/2014  . Arthritis of knee, degenerative 11/19/2014  . Neck pain 10/29/2014  . Insomnia 02/05/2014  . Medicare annual wellness visit, subsequent 11/22/2013  . RLS (restless legs syndrome) 10/04/2013  . UTI (lower urinary tract infection) 10/04/2013  . Cataracts, bilateral 04/02/2013  . Leg swelling 01/17/2012  . Hypokalemia 01/05/2012  . Anemia 01/05/2012  . Depression with anxiety 01/05/2012  . Postoperative anemia due to acute blood loss 11/15/2011  . 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 Pre-syncope 10/13/2011  . DM (diabetes mellitus) ([place]) 10/06/2011  . Pulmonary nodule 10/06/2011  . Abdominal pain, other specified site 10/06/2011  . DJD (degenerative joint disease) of knee 09/18/2011  . Hip pain 08/28/2011  . Renal insufficiency 08/28/2011  . CORONARY ATHEROSCLEROSIS NATIVE  CORONARY ARTERY 03/07/2010  . Coronary atherosclerosis 01/18/2009  . FATIGUE 01/18/2009  . PERSISTENT DISORDER INITIATING/MAINTAINING SLEEP 09/09/2008  . DERMATITIS 10/16/2007  . PERIPHERAL EDEMA 10/16/2007  . PALPITATIONS 10/16/2007  . Hyperlipidemia 04/14/2007  . Essential  hypertension 04/14/2007  . GERD 04/14/2007  . TRANSIENT ISCHEMIC ATTACK, HX OF 04/14/2007    Iman Reinertsen PT, OCS 05/25/2015, 3:59 PM  [facility] [address]  [place], [place], [address] Phone: [phone]   Fax:  [phone]  Name: Heather [name] MRN: [id number] Date of Birth: 09-04-1934      PHYSICAL THERAPY DISCHARGE SUMMARY  Visits from Start of Care: 4  Current functional level related to goals / functional outcomes: See above; all goals met   Remaining deficits: n/a   Education / Equipment: HEP  Plan: Patient agrees to discharge.  Patient goals were met. Patient is being discharged due to meeting the stated rehab goals.  ?????    Laureen Abrahams, PT, DPT 06/30/2015 1:31 PM  [facility] at [facility] [place], [place] [address]  [phone] (office) [phone] (fax)

## 2015-05-26 ENCOUNTER — Other Ambulatory Visit: Payer: Self-pay | Admitting: Cardiovascular Disease

## 2015-05-26 NOTE — Progress Notes (Signed)
PCP: [name], MD  Subjective:   HPI: Patient is a 80 y.o. female here for right shoulder pain.  10/18: Patient reports about 1 month of diffuse right shoulder pain. No known injury. Feels pain when holding a railing climbing stairs, coming up from bending over. Feels pain anterior clavicle through to posterior shoulder. Can feel like a hot, rod sensation. Pain level 5/10 but up to 10/10. Cannot lie on the right side. Has not done therapy or exercises for this. Baclofen caused side effects. No left shoulder pain. No numbness, tingling.  11/16: Patient reports she is much better, at least 70% better. Did physical therapy which helped, home exercises. Reports she rolled over in bed and felt a pop around lateral shoulder and felt better since that time. S/p prednisone. No skin changes, fever, other complaints.  Past Medical History  Diagnosis Date  . Coronary artery disease     post percutaneous coronary intervention in 2006  . Hyperlipidemia   . Restless legs     takes mirapex daily  . Fatigue   . Persistent disorder of initiating or maintaining sleep   . Dermatitis   . S/P inguinal herniorrhaphy   . DJD (degenerative joint disease) of knee 09/18/2011    knees- both, elbow- R, ankles   . Hypertension     takes medications daily  . GERD (gastroesophageal reflux disease)     prilosec daily  . H/O dizziness     pre-syncope/due to dehydration  . Occasional tremors   . Diverticulitis     hx of  . Gastric ulcer     hx of  . Angina   . Atrial fibrillation ([place])   . Dysrhythmia, cardiac     "palpitations"  . Borderline diabetes   . H/O hiatal hernia   . Stroke [facility]) 2000    TIA- series that have resolved-  . Hx of total knee replacement 11/12/11    left knee  . UTI (lower urinary tract infection) 10/04/2013  . Medicare annual wellness visit, subsequent 11/22/2013  . Hip pain 08/28/2011  . Diabetes mellitus without complication (HCC)     no meds  . Depression  with anxiety 01/05/2012  . Diarrhea 04/17/2015  . Headache 04/17/2015    Current Outpatient Prescriptions on File Prior to Visit  Medication Sig Dispense Refill  . amLODipine (NORVASC) 10 MG tablet TAKE 1/2 TABLET EVERY DAY 45 tablet 1  . aspirin EC 81 MG tablet Take 1 tablet (81 mg total) by mouth daily.    . Blood Glucose Monitoring Suppl (ONE TOUCH ULTRA SYSTEM KIT) W/DEVICE KIT 1 kit by Does not apply route once. DX: 250.00 Check sugars daily as needed 1 each 0  . cholecalciferol (VITAMIN D) 1000 UNITS tablet Take 1,000 Units by mouth daily.    Marland Kitchen glucose blood (ONE TOUCH TEST STRIPS) test strip Check BS daily and prn DX: E11.9 100 each 11  . isosorbide mononitrate (IMDUR) 60 MG 24 hr tablet Take 0.5 tablets (30 mg total) by mouth daily. 45 tablet 2  . losartan (COZAAR) 100 MG tablet Take 1 tablet (100 mg total) by mouth daily. 90 tablet 1  . magnesium oxide (MAG-OX) 400 MG tablet Take 400 mg by mouth 2 (two) times daily.     . metFORMIN (GLUCOPHAGE) 500 MG tablet TAKE 1 TABLET (500 MG TOTAL) BY MOUTH DAILY WITH BREAKFAST. 90 tablet 1  . metoprolol succinate (TOPROL-XL) 50 MG 24 hr tablet TAKE ONE TABLET BY MOUTH TWICE DAILY IMMEDIATELY  FOLOWING  A  MEAL 180 tablet 2  . omeprazole (PRILOSEC) 20 MG capsule Take 20 mg by mouth 2 (two) times daily before a meal.    . ONETOUCH DELICA LANCETS FINE MISC Check sugars daily and prn  DX 250.00 100 each 1  . potassium chloride SA (K-DUR,KLOR-CON) 20 MEQ tablet Take 20 mEq by mouth 2 (two) times daily.    . pramipexole (MIRAPEX) 0.25 MG tablet Take 0.25 mg by mouth 2 (two) times daily.    . simvastatin (ZOCOR) 20 MG tablet TAKE ONE TABLET BY MOUTH AT BEDTIME 90 tablet 3  . triamterene-hydrochlorothiazide (MAXZIDE-25) 37.5-25 MG tablet Take 1 tablet by mouth daily. 90 tablet 1  . [DISCONTINUED] nitroGLYCERIN (NITROSTAT) 0.4 MG SL tablet Place 0.4 mg under the tongue every 5 (five) minutes as needed.       No current facility-administered medications  on file prior to visit.    Past Surgical History  Procedure Laterality Date  . Abdominal hysterectomy    . Knee arthroscopy      2 on left and 1 on right  . Foot surgery      left foot stress fracture repair  . Hernia repair      1989- esophageal hernia   . Upper gastrointestinal endoscopy  2013  . Total knee arthroplasty  11/12/2011    Procedure: TOTAL KNEE ARTHROPLASTY;  Surgeon: Lorn Junes, MD;  Location: [facility];  Service: Orthopedics;  Laterality: Left;  Dr Noemi Chapel wants 90 minutes for this case  . Coronary angioplasty  03/2005; 06/2005    "1" plus "1"= "2 total"  . Brain surgery      related to stroke   2000  . Joint replacement      left  . Total knee arthroplasty Right 11/29/2014    Procedure: RIGHT TOTAL KNEE ARTHROPLASTY;  Surgeon: Vickey Huger, MD;  Location: [facility];  Service: Orthopedics;  Laterality: Right;  . Eye surgery  11-16-13    cataract -right eye, left eye summer 2015    Allergies  Allergen Reactions  . Baclofen Other (See Comments)    Hyperactivity     Social History   Social History  . Marital Status: Married    Spouse Name: N/A  . Number of Children: 3  . Years of Education: 12   Occupational History  . works partime in office     retired   Social History Main Topics  . Smoking status: Never Smoker   . Smokeless tobacco: Never Used  . Alcohol Use: No  . Drug Use: No  . Sexual Activity: Yes    Birth Control/ Protection: Surgical     Comment: lives with husband, no dietary restrictions   Other Topics Concern  . Not on file   Social History Narrative   Lives with husband   Caffeine use- none    Family History  Problem Relation Age of Onset  . Arthritis      family hx of  . Hypertension      family hx of  . Other      family hx of cardiovascular disorder  . Stroke Father     family hx of M 1st degree relative <50  . Coronary artery disease Mother   . Heart disease Mother   . Depression Brother   . Breast cancer Neg Hx   .  Colon cancer Neg Hx   . Anesthesia problems Neg Hx   . Hypotension Neg Hx   . Malignant hyperthermia Neg Hx   .  Pseudochol deficiency Neg Hx   . Stroke Brother   . Diabetes Brother   . Cancer Brother     bladder with mets  . Diabetes Daughter     borderline  . Hypertension Daughter   . Thyroid disease Daughter     BP 152/68 mmHg  Pulse 63  Ht '5\' 3"'  (1.6 m)  Wt 153 lb (69.4 kg)  BMI 27.11 kg/m2  Review of Systems: See HPI above.    Objective:  Physical Exam:  Gen: NAD  Neck: No gross deformity, swelling, bruising. No TTP right trapezius, cervical paraspinal region.  No midline/bony TTP. FROM neck without pain. BUE strength 5/5.   Sensation intact to light touch.   2+ equal reflexes in triceps, biceps, brachioradialis tendons. Negative spurlings. NV intact distal BUEs.  Right shoulder: Apparent anterior subluxation right clavicle at [facility] joint.  No swelling, ecchymoses, other gross deformity. Minimal TTP at [facility] joint. FROM. Negative Hawkins, Neers. Negative Speeds, Yergasons. Strength 5/5 with empty can and resisted internal/external rotation. Negative apprehension. NV intact distally.    Assessment & Plan:  1. Right shoulder pain - consistent with primarily trapezius strain, spasms.  She was improving with PT, home exercises, prednisone then had a pop lateral shoulder which seemed to accelerate her healing.  I cannot really explain the pop though based on location, exam.  Regardless last PT visit today, continue home exercises as directed by them.  Call me with any concerns otherwise f/u prn.

## 2015-05-26 NOTE — Assessment & Plan Note (Signed)
consistent with primarily trapezius strain, spasms.  She was improving with PT, home exercises, prednisone then had a pop lateral shoulder which seemed to accelerate her healing.  I cannot really explain the pop though based on location, exam.  Regardless last PT visit today, continue home exercises as directed by them.  Call me with any concerns otherwise f/u prn.

## 2015-05-31 ENCOUNTER — Ambulatory Visit: Payer: Medicare Other | Admitting: Physical Therapy

## 2015-06-03 NOTE — Assessment & Plan Note (Addendum)
Recurrent, referred to urology for further consideraiton due to persistent discomfort. Urine culture negative.

## 2015-06-03 NOTE — Assessment & Plan Note (Signed)
Check stool culture, start probiotics and fiber supplements.   Call if symptoms worsen

## 2015-06-03 NOTE — Assessment & Plan Note (Addendum)
Poorly controlled, no changes to meds. Encouraged heart healthy diet such as the DASH diet and exercise as tolerated. Increase Losartan to 100 mg and recheck

## 2015-06-03 NOTE — Progress Notes (Signed)
Subjective:    Patient ID: Heather [name], female    DOB: 02-26-1935, 80 y.o.   MRN: [id number]  Chief Complaint  Patient presents with  . Follow-up    HPI Patient is in today for follow up on recurrent urinary tract infections. She has had several this year ans is noting increased dysuria this week. No fevers, chills or malaise. Has some minor urinary frequency but no urgency. Notes some mild low back pain but unchanged from baseline. No abdominal pain . Is noted to have some left foot pain as well, has seen [facility] in the past. No recent injury or fall. Denies CP/palp/SOB/HA/congestion/fevers/GI or GU c/o. Taking meds as prescribed  Past Medical History  Diagnosis Date  . Coronary artery disease     post percutaneous coronary intervention in 2006  . Hyperlipidemia   . Restless legs     takes mirapex daily  . Fatigue   . Persistent disorder of initiating or maintaining sleep   . Dermatitis   . S/P inguinal herniorrhaphy   . DJD (degenerative joint disease) of knee 09/18/2011    knees- both, elbow- R, ankles   . Hypertension     takes medications daily  . GERD (gastroesophageal reflux disease)     prilosec daily  . H/O dizziness     pre-syncope/due to dehydration  . Occasional tremors   . Diverticulitis     hx of  . Gastric ulcer     hx of  . Angina   . Atrial fibrillation ([place])   . Dysrhythmia, cardiac     "palpitations"  . Borderline diabetes   . H/O hiatal hernia   . Stroke [facility]) 2000    TIA- series that have resolved-  . Hx of total knee replacement 11/12/11    left knee  . UTI (lower urinary tract infection) 10/04/2013  . Medicare annual wellness visit, subsequent 11/22/2013  . Hip pain 08/28/2011  . Diabetes mellitus without complication (HCC)     no meds  . Depression with anxiety 01/05/2012  . Diarrhea 04/17/2015  . Headache 04/17/2015    Past Surgical History  Procedure Laterality Date  . Abdominal hysterectomy    . Knee arthroscopy      2 on left and 1  on right  . Foot surgery      left foot stress fracture repair  . Hernia repair      1989- esophageal hernia   . Upper gastrointestinal endoscopy  2013  . Total knee arthroplasty  11/12/2011    Procedure: TOTAL KNEE ARTHROPLASTY;  Surgeon: Lorn Junes, MD;  Location: [place];  Service: Orthopedics;  Laterality: Left;  Dr Noemi Chapel wants 90 minutes for this case  . Coronary angioplasty  03/2005; 06/2005    "1" plus "1"= "2 total"  . Brain surgery      related to stroke   2000  . Joint replacement      left  . Total knee arthroplasty Right 11/29/2014    Procedure: RIGHT TOTAL KNEE ARTHROPLASTY;  Surgeon: Vickey Huger, MD;  Location: [place];  Service: Orthopedics;  Laterality: Right;  . Eye surgery  11-16-13    cataract -right eye, left eye summer 2015    Family History  Problem Relation Age of Onset  . Arthritis      family hx of  . Hypertension      family hx of  . Other      family hx of cardiovascular disorder  . Stroke  Father     family hx of M 1st degree relative <50  . Coronary artery disease Mother   . Heart disease Mother   . Depression Brother   . Breast cancer Neg Hx   . Colon cancer Neg Hx   . Anesthesia problems Neg Hx   . Hypotension Neg Hx   . Malignant hyperthermia Neg Hx   . Pseudochol deficiency Neg Hx   . Stroke Brother   . Diabetes Brother   . Cancer Brother     bladder with mets  . Diabetes Daughter     borderline  . Hypertension Daughter   . Thyroid disease Daughter     Social History   Social History  . Marital Status: Married    Spouse Name: N/A  . Number of Children: 3  . Years of Education: 12   Occupational History  . works partime in office     retired   Social History Main Topics  . Smoking status: Never Smoker   . Smokeless tobacco: Never Used  . Alcohol Use: No  . Drug Use: No  . Sexual Activity: Yes    Birth Control/ Protection: Surgical     Comment: lives with husband, no dietary restrictions   Other Topics Concern  . Not on  file   Social History Narrative   Lives with husband   Caffeine use- none    Outpatient Prescriptions Prior to Visit  Medication Sig Dispense Refill  . amLODipine (NORVASC) 10 MG tablet TAKE 1/2 TABLET EVERY DAY 45 tablet 1  . aspirin EC 81 MG tablet Take 1 tablet (81 mg total) by mouth daily.    . Blood Glucose Monitoring Suppl (ONE TOUCH ULTRA SYSTEM KIT) W/DEVICE KIT 1 kit by Does not apply route once. DX: 250.00 Check sugars daily as needed 1 each 0  . cholecalciferol (VITAMIN D) 1000 UNITS tablet Take 1,000 Units by mouth daily.    Marland Kitchen glucose blood (ONE TOUCH TEST STRIPS) test strip Check BS daily and prn DX: E11.9 100 each 11  . magnesium oxide (MAG-OX) 400 MG tablet Take 400 mg by mouth 2 (two) times daily.     . metFORMIN (GLUCOPHAGE) 500 MG tablet TAKE 1 TABLET (500 MG TOTAL) BY MOUTH DAILY WITH BREAKFAST. 90 tablet 1  . omeprazole (PRILOSEC) 20 MG capsule Take 20 mg by mouth 2 (two) times daily before a meal.    . ONETOUCH DELICA LANCETS FINE MISC Check sugars daily and prn  DX 250.00 100 each 1  . pramipexole (MIRAPEX) 0.25 MG tablet Take 0.25 mg by mouth 2 (two) times daily.    . simvastatin (ZOCOR) 20 MG tablet TAKE ONE TABLET BY MOUTH AT BEDTIME 90 tablet 3  . isosorbide mononitrate (IMDUR) 60 MG 24 hr tablet Take 0.5 tablets (30 mg total) by mouth daily. 45 tablet 2  . losartan-hydrochlorothiazide (HYZAAR) 100-25 MG per tablet TAKE ONE TABLET BY MOUTH ONCE DAILY 90 tablet 3  . metoprolol succinate (TOPROL-XL) 50 MG 24 hr tablet TAKE ONE TABLET BY MOUTH TWICE DAILY IMMEDIATELY  FOLOWING  A  MEAL 180 tablet 2  . potassium chloride SA (K-DUR,KLOR-CON) 20 MEQ tablet Take 20 mEq by mouth 2 (two) times daily.    . rivaroxaban (XARELTO) 20 MG TABS tablet Take 1 tablet (20 mg total) by mouth daily with supper. 30 tablet 1   No facility-administered medications prior to visit.    Allergies  Allergen Reactions  . Baclofen Other (See Comments)    Hyperactivity  Review of  Systems  Constitutional: Negative for fever and malaise/fatigue.  HENT: Negative for congestion.   Eyes: Negative for discharge.  Respiratory: Negative for shortness of breath.   Cardiovascular: Negative for chest pain, palpitations and leg swelling.  Gastrointestinal: Positive for diarrhea. Negative for nausea and abdominal pain.  Genitourinary: Positive for dysuria.  Musculoskeletal: Positive for joint pain. Negative for falls.  Skin: Negative for rash.  Neurological: Negative for loss of consciousness and headaches.  Endo/Heme/Allergies: Negative for environmental allergies.  Psychiatric/Behavioral: Negative for depression. The patient is not nervous/anxious.        Objective:    Physical Exam  Constitutional: She is oriented to person, place, and time. She appears well-developed and well-nourished. No distress.  HENT:  Head: Normocephalic and atraumatic.  Nose: Nose normal.  Eyes: Right eye exhibits no discharge. Left eye exhibits no discharge.  Neck: Normal range of motion. Neck supple.  Cardiovascular: Normal rate and regular rhythm.   No murmur heard. Pulmonary/Chest: Effort normal and breath sounds normal.  Abdominal: Soft. Bowel sounds are normal. There is no tenderness.  Musculoskeletal: She exhibits no edema.  Neurological: She is alert and oriented to person, place, and time.  Skin: Skin is warm and dry.  Psychiatric: She has a normal mood and affect.  Nursing note and vitals reviewed.   BP 163/58 mmHg  Pulse 59  Temp(Src) 98 F (36.7 C) (Oral)  Ht 5' 3" (1.6 m)  Wt 157 lb 4 oz (71.328 kg)  BMI 27.86 kg/m2  SpO2 97% Wt Readings from Last 3 Encounters:  05/25/15 153 lb (69.4 kg)  05/24/15 157 lb 4 oz (71.328 kg)  05/04/15 153 lb (69.4 kg)     Lab Results  Component Value Date   WBC 7.0 04/05/2015   HGB 13.5 04/05/2015   HCT 40.8 04/05/2015   PLT 185.0 04/05/2015   GLUCOSE 122* 04/05/2015   CHOL 151 04/05/2015   TRIG 153.0* 04/05/2015   HDL 59.90  04/05/2015   LDLDIRECT 73.4 05/20/2014   LDLCALC 60 04/05/2015   ALT 15 04/05/2015   AST 18 04/05/2015   NA 138 04/05/2015   K 3.8 04/05/2015   CL 100 04/05/2015   CREATININE 0.84 04/05/2015   BUN 17 04/05/2015   CO2 30 04/05/2015   TSH 1.29 04/05/2015   INR 0.98 11/16/2014   HGBA1C 6.8* 04/05/2015    Lab Results  Component Value Date   TSH 1.29 04/05/2015   Lab Results  Component Value Date   WBC 7.0 04/05/2015   HGB 13.5 04/05/2015   HCT 40.8 04/05/2015   MCV 87.0 04/05/2015   PLT 185.0 04/05/2015   Lab Results  Component Value Date   NA 138 04/05/2015   K 3.8 04/05/2015   CO2 30 04/05/2015   GLUCOSE 122* 04/05/2015   BUN 17 04/05/2015   CREATININE 0.84 04/05/2015   BILITOT 0.6 04/05/2015   ALKPHOS 88 04/05/2015   AST 18 04/05/2015   ALT 15 04/05/2015   PROT 6.8 04/05/2015   ALBUMIN 4.4 04/05/2015   CALCIUM 9.8 04/05/2015   ANIONGAP 8 11/30/2014   GFR 69.61 04/05/2015   Lab Results  Component Value Date   CHOL 151 04/05/2015   Lab Results  Component Value Date   HDL 59.90 04/05/2015   Lab Results  Component Value Date   LDLCALC 60 04/05/2015   Lab Results  Component Value Date   TRIG 153.0* 04/05/2015   Lab Results  Component Value Date   CHOLHDL 3 04/05/2015  Lab Results  Component Value Date   HGBA1C 6.8* 04/05/2015       Assessment & Plan:   Problem List Items Addressed This Visit    Diarrhea    Check stool culture, start probiotics and fiber supplements.   Call if symptoms worsen      Relevant Orders   Clostridium Difficile by PCR   DM (diabetes mellitus) ([place])    hgba1c acceptable, minimize simple carbs. Increase exercise as tolerated. Continue current meds      Relevant Medications   losartan (COZAAR) 100 MG tablet   Essential hypertension    Poorly controlled, no changes to meds. Encouraged heart healthy diet such as the DASH diet and exercise as tolerated. Increase Losartan to 100 mg and recheck      Relevant  Medications   losartan (COZAAR) 100 MG tablet   triamterene-hydrochlorothiazide (MAXZIDE-25) 37.5-25 MG tablet   PALPITATIONS    No recent episodes      UTI (lower urinary tract infection)    Recurrent, referred to urology for further consideraiton due to persistent discomfort. Urine culture negative.        Other Visit Diagnoses    Dysuria    -  Primary    Relevant Orders    Ambulatory referral to Urology    Urinalysis (Completed)    Urine culture (Completed)    Pain of hand, unspecified laterality        Relevant Orders    Rheumatoid Factor (Completed)    Antinuclear Antib (ANA) (Completed)    Sed Rate (ESR) (Completed)    Need for diphtheria-tetanus-pertussis (Tdap) vaccine, adult/adolescent        Relevant Orders    Tdap vaccine greater than or equal to 7yo IM (Completed)       I have discontinued Heather [name]'s losartan-hydrochlorothiazide and rivaroxaban. I am also having her start on losartan. Additionally, I am having her maintain her magnesium oxide, ONE TOUCH ULTRA SYSTEM KIT, ONETOUCH DELICA LANCETS FINE, omeprazole, simvastatin, glucose blood, cholecalciferol, metFORMIN, amLODipine, pramipexole, aspirin EC, and triamterene-hydrochlorothiazide.  Meds ordered this encounter  Medications  . losartan (COZAAR) 100 MG tablet    Sig: Take 1 tablet (100 mg total) by mouth daily.    Dispense:  90 tablet    Refill:  1  . DISCONTD: triamterene-hydrochlorothiazide (MAXZIDE-25) 37.5-25 MG tablet    Sig: Take 1 tablet by mouth daily.    Dispense:  90 tablet    Refill:  1  . triamterene-hydrochlorothiazide (MAXZIDE-25) 37.5-25 MG tablet    Sig: Take 1 tablet by mouth daily.    Dispense:  90 tablet    Refill:  1     [name], MD

## 2015-06-03 NOTE — Assessment & Plan Note (Signed)
No recent episodes

## 2015-06-03 NOTE — Assessment & Plan Note (Signed)
hgba1c acceptable, minimize simple carbs. Increase exercise as tolerated. Continue current meds 

## 2015-06-14 DIAGNOSIS — Z96651 Presence of right artificial knee joint: Secondary | ICD-10-CM | POA: Diagnosis not present

## 2015-06-16 ENCOUNTER — Other Ambulatory Visit: Payer: Self-pay | Admitting: Cardiovascular Disease

## 2015-07-01 ENCOUNTER — Encounter (HOSPITAL_BASED_OUTPATIENT_CLINIC_OR_DEPARTMENT_OTHER): Payer: Self-pay | Admitting: *Deleted

## 2015-07-01 ENCOUNTER — Emergency Department (HOSPITAL_BASED_OUTPATIENT_CLINIC_OR_DEPARTMENT_OTHER)
Admission: EM | Admit: 2015-07-01 | Discharge: 2015-07-01 | Disposition: A | Discharge status: Home / Self Care | Payer: Medicare Other | Attending: Emergency Medicine | Admitting: Emergency Medicine

## 2015-07-01 DIAGNOSIS — Z9861 Coronary angioplasty status: Secondary | ICD-10-CM | POA: Insufficient documentation

## 2015-07-01 DIAGNOSIS — Z872 Personal history of diseases of the skin and subcutaneous tissue: Secondary | ICD-10-CM | POA: Diagnosis not present

## 2015-07-01 DIAGNOSIS — M545 Low back pain, unspecified: Secondary | ICD-10-CM

## 2015-07-01 DIAGNOSIS — Z8673 Personal history of transient ischemic attack (TIA), and cerebral infarction without residual deficits: Secondary | ICD-10-CM | POA: Diagnosis not present

## 2015-07-01 DIAGNOSIS — I2510 Atherosclerotic heart disease of native coronary artery without angina pectoris: Secondary | ICD-10-CM | POA: Diagnosis not present

## 2015-07-01 DIAGNOSIS — E119 Type 2 diabetes mellitus without complications: Secondary | ICD-10-CM | POA: Insufficient documentation

## 2015-07-01 DIAGNOSIS — K219 Gastro-esophageal reflux disease without esophagitis: Secondary | ICD-10-CM | POA: Diagnosis not present

## 2015-07-01 DIAGNOSIS — Z87440 Personal history of urinary (tract) infections: Secondary | ICD-10-CM | POA: Insufficient documentation

## 2015-07-01 DIAGNOSIS — G2581 Restless legs syndrome: Secondary | ICD-10-CM | POA: Diagnosis not present

## 2015-07-01 DIAGNOSIS — Z7984 Long term (current) use of oral hypoglycemic drugs: Secondary | ICD-10-CM | POA: Insufficient documentation

## 2015-07-01 DIAGNOSIS — Z79899 Other long term (current) drug therapy: Secondary | ICD-10-CM | POA: Diagnosis not present

## 2015-07-01 DIAGNOSIS — I10 Essential (primary) hypertension: Secondary | ICD-10-CM | POA: Insufficient documentation

## 2015-07-01 DIAGNOSIS — I4891 Unspecified atrial fibrillation: Secondary | ICD-10-CM | POA: Diagnosis not present

## 2015-07-01 DIAGNOSIS — E785 Hyperlipidemia, unspecified: Secondary | ICD-10-CM | POA: Diagnosis not present

## 2015-07-01 DIAGNOSIS — Z7982 Long term (current) use of aspirin: Secondary | ICD-10-CM | POA: Diagnosis not present

## 2015-07-01 DIAGNOSIS — Z8659 Personal history of other mental and behavioral disorders: Secondary | ICD-10-CM | POA: Diagnosis not present

## 2015-07-01 LAB — URINALYSIS, ROUTINE W REFLEX MICROSCOPIC
Bilirubin Urine: NEGATIVE
Glucose, UA: NEGATIVE mg/dL
Hgb urine dipstick: NEGATIVE
Ketones, ur: NEGATIVE mg/dL
Leukocytes, UA: NEGATIVE
Nitrite: NEGATIVE
Protein, ur: NEGATIVE mg/dL
Specific Gravity, Urine: 1.011 (ref 1.005–1.030)
pH: 6 (ref 5.0–8.0)

## 2015-07-01 MED ORDER — IBUPROFEN 400 MG PO TABS
400.0000 mg | ORAL_TABLET | Freq: Once | ORAL | Status: AC
  Administered 2015-07-01: 400 mg via ORAL
  Filled 2015-07-01: qty 1

## 2015-07-01 MED ORDER — TRAMADOL HCL 50 MG PO TABS
50.0000 mg | ORAL_TABLET | Freq: Once | ORAL | Status: AC
  Administered 2015-07-01: 50 mg via ORAL
  Filled 2015-07-01: qty 1

## 2015-07-01 MED ORDER — TRAMADOL HCL 50 MG PO TABS
50.0000 mg | ORAL_TABLET | Freq: Four times a day (QID) | ORAL | Status: DC | PRN
Start: 2015-07-01 — End: 2016-06-18

## 2015-07-01 NOTE — Discharge Instructions (Signed)

## 2015-07-01 NOTE — ED Provider Notes (Signed)
CSN: [id number]     Arrival date & time 07/01/15  1916 History  By signing my name below, I, Soijett Blue, attest that this documentation has been prepared under the direction and in the presence of Virgel Manifold, MD. Electronically Signed: Soijett Blue, ED Scribe. 07/01/2015. 10:39 PM.   Chief Complaint  Patient presents with  . Back Pain      The history is provided by the patient. No language interpreter was used.    HPI Comments: Heather [name] is a 80 y.o. female who presents to the Emergency Department complaining of right low back pain onset noon today. She reports that the back pain does not radiate to her buttock or legs and she describes the pain as a "grabbing" sensation when she changes positions or ambulates. Denies PMHx of back pain or back surgeries. She states that she is having associated symptoms of decreased urine output, low grade fever, nausea x last night, and vomiting x last night. She states that she has not tried any medications for the relief for her symptoms. Pt denies numbness, tingling, dysuria, and any other symptoms. Denies taking blood thinners at this time but she had a DVT to her right leg and was taking xarelto and finished taking it x 1 month ago.   Past Medical History  Diagnosis Date  . Coronary artery disease     post percutaneous coronary intervention in 2006  . Hyperlipidemia   . Restless legs     takes mirapex daily  . Fatigue   . Persistent disorder of initiating or maintaining sleep   . Dermatitis   . S/P inguinal herniorrhaphy   . DJD (degenerative joint disease) of knee 09/18/2011    knees- both, elbow- R, ankles   . Hypertension     takes medications daily  . GERD (gastroesophageal reflux disease)     prilosec daily  . H/O dizziness     pre-syncope/due to dehydration  . Occasional tremors   . Diverticulitis     hx of  . Gastric ulcer     hx of  . Angina   . Atrial fibrillation ([place])   . Dysrhythmia, cardiac     "palpitations"   . Borderline diabetes   . H/O hiatal hernia   . Stroke [facility]) 2000    TIA- series that have resolved-  . Hx of total knee replacement 11/12/11    left knee  . UTI (lower urinary tract infection) 10/04/2013  . Medicare annual wellness visit, subsequent 11/22/2013  . Hip pain 08/28/2011  . Diabetes mellitus without complication (HCC)     no meds  . Depression with anxiety 01/05/2012  . Diarrhea 04/17/2015  . Headache 04/17/2015   Past Surgical History  Procedure Laterality Date  . Abdominal hysterectomy    . Knee arthroscopy      2 on left and 1 on right  . Foot surgery      left foot stress fracture repair  . Hernia repair      1989- esophageal hernia   . Upper gastrointestinal endoscopy  2013  . Total knee arthroplasty  11/12/2011    Procedure: TOTAL KNEE ARTHROPLASTY;  Surgeon: Lorn Junes, MD;  Location: [place];  Service: Orthopedics;  Laterality: Left;  Dr Noemi Chapel wants 90 minutes for this case  . Coronary angioplasty  03/2005; 06/2005    "1" plus "1"= "2 total"  . Brain surgery      related to stroke   2000  . Joint replacement  left  . Total knee arthroplasty Right 11/29/2014    Procedure: RIGHT TOTAL KNEE ARTHROPLASTY;  Surgeon: Vickey Huger, MD;  Location: [place];  Service: Orthopedics;  Laterality: Right;  . Eye surgery  11-16-13    cataract -right eye, left eye summer 2015   Family History  Problem Relation Age of Onset  . Arthritis      family hx of  . Hypertension      family hx of  . Other      family hx of cardiovascular disorder  . Stroke Father     family hx of M 1st degree relative <50  . Coronary artery disease Mother   . Heart disease Mother   . Depression Brother   . Breast cancer Neg Hx   . Colon cancer Neg Hx   . Anesthesia problems Neg Hx   . Hypotension Neg Hx   . Malignant hyperthermia Neg Hx   . Pseudochol deficiency Neg Hx   . Stroke Brother   . Diabetes Brother   . Cancer Brother     bladder with mets  . Diabetes Daughter     borderline   . Hypertension Daughter   . Thyroid disease Daughter    Social History  Substance Use Topics  . Smoking status: Never Smoker   . Smokeless tobacco: Never Used  . Alcohol Use: No   OB History    No data available     Review of Systems  All other systems reviewed and are negative.     Allergies  Baclofen  Home Medications   Prior to Admission medications   Medication Sig Start Date End Date Taking? Authorizing Provider  amLODipine (NORVASC) 10 MG tablet TAKE 1/2 TABLET EVERY DAY 05/03/15   Mosie Lukes, MD  aspirin EC 81 MG tablet Take 1 tablet (81 mg total) by mouth daily. 05/04/15   [name], MD  Blood Glucose Monitoring Suppl (ONE TOUCH ULTRA SYSTEM KIT) W/DEVICE KIT 1 kit by Does not apply route once. DX: 250.00 Check sugars daily as needed 01/01/14   Mosie Lukes, MD  cholecalciferol (VITAMIN D) 1000 UNITS tablet Take 1,000 Units by mouth daily.    Historical Provider, MD  glucose blood (ONE TOUCH TEST STRIPS) test strip Check BS daily and prn DX: E11.9 08/19/14   Mosie Lukes, MD  isosorbide mononitrate (IMDUR) 60 MG 24 hr tablet TAKE 1/2 TABLET EVERY DAY 05/27/15   [name], MD  losartan (COZAAR) 100 MG tablet Take 1 tablet (100 mg total) by mouth daily. 05/24/15   Mosie Lukes, MD  magnesium oxide (MAG-OX) 400 MG tablet Take 400 mg by mouth 2 (two) times daily.     Historical Provider, MD  metFORMIN (GLUCOPHAGE) 500 MG tablet TAKE 1 TABLET (500 MG TOTAL) BY MOUTH DAILY WITH BREAKFAST. 02/27/15   Mosie Lukes, MD  metoprolol succinate (TOPROL-XL) 50 MG 24 hr tablet TAKE 1 TABLET TWICE DAILY IMMEDIATELY FOLLOWING A MEAL 05/27/15   [name], MD  omeprazole (PRILOSEC) 20 MG capsule Take 20 mg by mouth 2 (two) times daily before a meal.    Historical Provider, MD  [facility] DELICA LANCETS FINE MISC Check sugars daily and prn  DX 250.00 01/05/14   Mosie Lukes, MD  potassium chloride SA (K-DUR,KLOR-CON) 20 MEQ tablet TAKE 1 TABLET EVERY DAY 05/27/15    [name], MD  pramipexole (MIRAPEX) 0.25 MG tablet Take 0.25 mg by mouth 2 (two) times daily.    Historical Provider,  MD  simvastatin (ZOCOR) 20 MG tablet Take 1 tablet (20 mg total) by mouth at bedtime. 06/17/15   [name], MD  triamterene-hydrochlorothiazide (MAXZIDE-25) 37.5-25 MG tablet Take 1 tablet by mouth daily. 05/24/15   Mosie Lukes, MD   BP 153/66 mmHg  Pulse 67  Temp(Src) 98.1 F (36.7 C)  Resp 18  Ht 5' 3" (1.6 m)  Wt 152 lb (68.947 kg)  BMI 26.93 kg/m2  SpO2 97% Physical Exam  Constitutional: She is oriented to person, place, and time. She appears well-developed and well-nourished. No distress.  HENT:  Head: Normocephalic and atraumatic.  Right Ear: Hearing normal.  Left Ear: Hearing normal.  Nose: Nose normal.  Mouth/Throat: Oropharynx is clear and moist and mucous membranes are normal.  Eyes: Conjunctivae and EOM are normal. Pupils are equal, round, and reactive to light.  Neck: Normal range of motion. Neck supple.  Cardiovascular: Normal rate, regular rhythm, S1 normal and S2 normal.  Exam reveals no gallop and no friction rub.   No murmur heard. Pulmonary/Chest: Effort normal and breath sounds normal. No respiratory distress. She exhibits no tenderness.  Abdominal: Soft. Normal appearance and bowel sounds are normal. There is no hepatosplenomegaly. There is no tenderness. There is no rebound, no guarding, no tenderness at McBurney's point and negative Murphy's sign. No hernia.  Musculoskeletal: Normal range of motion.  Tenderness over right SI joint. Negative SLR. No app pain with ROM of right hip. NVI.  Neurological: She is alert and oriented to person, place, and time. She has normal strength. No cranial nerve deficit or sensory deficit. Coordination normal. GCS eye subscore is 4. GCS verbal subscore is 5. GCS motor subscore is 6.  Skin: Skin is warm, dry and intact. No rash noted. No cyanosis.  Psychiatric: She has a normal mood and affect. Her  speech is normal and behavior is normal. Thought content normal.  Nursing note and vitals reviewed.   ED Course  Procedures (including critical care time) DIAGNOSTIC STUDIES: Oxygen Saturation is 94% on RA, adequate by my interpretation.    COORDINATION OF CARE: 10:38 PM Discussed treatment plan with pt at bedside which includes pain medication and pt agreed to plan.    Labs Review Labs Reviewed - No data to display  Imaging Review No results found.    EKG Interpretation None      MDM   Final diagnoses:  Right-sided low back pain without sciatica    80 year old female with atraumatic lower back pain. No acute neurological complaints. Nonfocal neurological examination. Low suspicion for cord compression or other emergent pathology. Plan symptomatic treatment.  I personally preformed the services scribed in my presence. The recorded information has been reviewed is accurate. Virgel Manifold, MD.    Virgel Manifold, MD 07/07/15 [phone]

## 2015-07-01 NOTE — ED Notes (Signed)
Pt c/o lower back pain x 2 days

## 2015-08-10 LAB — HM DIABETES EYE EXAM

## 2015-08-23 DIAGNOSIS — L820 Inflamed seborrheic keratosis: Secondary | ICD-10-CM | POA: Diagnosis not present

## 2015-08-23 DIAGNOSIS — L570 Actinic keratosis: Secondary | ICD-10-CM | POA: Diagnosis not present

## 2015-08-24 ENCOUNTER — Encounter: Payer: Self-pay | Admitting: Family Medicine

## 2015-08-25 ENCOUNTER — Other Ambulatory Visit: Payer: Self-pay | Admitting: Family Medicine

## 2015-08-25 DIAGNOSIS — M1990 Unspecified osteoarthritis, unspecified site: Secondary | ICD-10-CM

## 2015-08-25 DIAGNOSIS — M79643 Pain in unspecified hand: Secondary | ICD-10-CM

## 2015-08-29 DIAGNOSIS — H5203 Hypermetropia, bilateral: Secondary | ICD-10-CM | POA: Diagnosis not present

## 2015-08-29 DIAGNOSIS — H35033 Hypertensive retinopathy, bilateral: Secondary | ICD-10-CM | POA: Diagnosis not present

## 2015-08-29 DIAGNOSIS — H43811 Vitreous degeneration, right eye: Secondary | ICD-10-CM | POA: Diagnosis not present

## 2015-08-29 DIAGNOSIS — H52223 Regular astigmatism, bilateral: Secondary | ICD-10-CM | POA: Diagnosis not present

## 2015-08-29 DIAGNOSIS — Z961 Presence of intraocular lens: Secondary | ICD-10-CM | POA: Diagnosis not present

## 2015-08-29 DIAGNOSIS — H26493 Other secondary cataract, bilateral: Secondary | ICD-10-CM | POA: Diagnosis not present

## 2015-08-29 DIAGNOSIS — H43812 Vitreous degeneration, left eye: Secondary | ICD-10-CM | POA: Diagnosis not present

## 2015-08-29 DIAGNOSIS — I10 Essential (primary) hypertension: Secondary | ICD-10-CM | POA: Diagnosis not present

## 2015-08-29 LAB — HM DIABETES EYE EXAM

## 2015-09-07 DIAGNOSIS — M150 Primary generalized (osteo)arthritis: Secondary | ICD-10-CM | POA: Diagnosis not present

## 2015-09-07 DIAGNOSIS — M898X1 Other specified disorders of bone, shoulder: Secondary | ICD-10-CM | POA: Diagnosis not present

## 2015-09-07 DIAGNOSIS — M2550 Pain in unspecified joint: Secondary | ICD-10-CM | POA: Diagnosis not present

## 2015-09-07 DIAGNOSIS — M79642 Pain in left hand: Secondary | ICD-10-CM | POA: Diagnosis not present

## 2015-09-07 DIAGNOSIS — M79641 Pain in right hand: Secondary | ICD-10-CM | POA: Diagnosis not present

## 2015-09-27 DIAGNOSIS — H26493 Other secondary cataract, bilateral: Secondary | ICD-10-CM | POA: Diagnosis not present

## 2015-09-27 DIAGNOSIS — Z961 Presence of intraocular lens: Secondary | ICD-10-CM | POA: Diagnosis not present

## 2015-09-27 DIAGNOSIS — H18413 Arcus senilis, bilateral: Secondary | ICD-10-CM | POA: Diagnosis not present

## 2015-09-27 DIAGNOSIS — H26492 Other secondary cataract, left eye: Secondary | ICD-10-CM | POA: Diagnosis not present

## 2015-10-05 DIAGNOSIS — H43811 Vitreous degeneration, right eye: Secondary | ICD-10-CM | POA: Diagnosis not present

## 2015-10-05 DIAGNOSIS — Z9842 Cataract extraction status, left eye: Secondary | ICD-10-CM | POA: Diagnosis not present

## 2015-10-05 DIAGNOSIS — H35033 Hypertensive retinopathy, bilateral: Secondary | ICD-10-CM | POA: Diagnosis not present

## 2015-10-05 DIAGNOSIS — H5203 Hypermetropia, bilateral: Secondary | ICD-10-CM | POA: Diagnosis not present

## 2015-10-05 DIAGNOSIS — H26491 Other secondary cataract, right eye: Secondary | ICD-10-CM | POA: Diagnosis not present

## 2015-10-05 DIAGNOSIS — H52223 Regular astigmatism, bilateral: Secondary | ICD-10-CM | POA: Diagnosis not present

## 2015-10-05 DIAGNOSIS — H43812 Vitreous degeneration, left eye: Secondary | ICD-10-CM | POA: Diagnosis not present

## 2015-10-05 DIAGNOSIS — Z961 Presence of intraocular lens: Secondary | ICD-10-CM | POA: Diagnosis not present

## 2015-10-05 DIAGNOSIS — I10 Essential (primary) hypertension: Secondary | ICD-10-CM | POA: Diagnosis not present

## 2015-11-04 DIAGNOSIS — D485 Neoplasm of uncertain behavior of skin: Secondary | ICD-10-CM | POA: Diagnosis not present

## 2015-11-04 DIAGNOSIS — L820 Inflamed seborrheic keratosis: Secondary | ICD-10-CM | POA: Diagnosis not present

## 2015-11-08 ENCOUNTER — Other Ambulatory Visit: Payer: Self-pay | Admitting: Family Medicine

## 2015-11-14 ENCOUNTER — Other Ambulatory Visit: Payer: Self-pay | Admitting: Cardiovascular Disease

## 2015-11-14 DIAGNOSIS — I6523 Occlusion and stenosis of bilateral carotid arteries: Secondary | ICD-10-CM

## 2015-11-17 ENCOUNTER — Ambulatory Visit (HOSPITAL_COMMUNITY)
Admission: RE | Admit: 2015-11-17 | Discharge: 2015-11-17 | Disposition: A | Discharge status: Home / Self Care | Payer: Medicare Other | Source: Ambulatory Visit | Attending: Cardiovascular Disease | Admitting: Cardiovascular Disease

## 2015-11-17 DIAGNOSIS — E785 Hyperlipidemia, unspecified: Secondary | ICD-10-CM | POA: Diagnosis not present

## 2015-11-17 DIAGNOSIS — E119 Type 2 diabetes mellitus without complications: Secondary | ICD-10-CM | POA: Insufficient documentation

## 2015-11-17 DIAGNOSIS — I10 Essential (primary) hypertension: Secondary | ICD-10-CM | POA: Diagnosis not present

## 2015-11-17 DIAGNOSIS — I6523 Occlusion and stenosis of bilateral carotid arteries: Secondary | ICD-10-CM | POA: Insufficient documentation

## 2015-11-17 DIAGNOSIS — K219 Gastro-esophageal reflux disease without esophagitis: Secondary | ICD-10-CM | POA: Diagnosis not present

## 2015-11-22 DIAGNOSIS — H26491 Other secondary cataract, right eye: Secondary | ICD-10-CM | POA: Diagnosis not present

## 2015-11-23 ENCOUNTER — Ambulatory Visit: Payer: Medicare Other

## 2015-12-16 ENCOUNTER — Ambulatory Visit: Payer: Medicare Other

## 2015-12-19 DIAGNOSIS — H43812 Vitreous degeneration, left eye: Secondary | ICD-10-CM | POA: Diagnosis not present

## 2015-12-19 DIAGNOSIS — I10 Essential (primary) hypertension: Secondary | ICD-10-CM | POA: Diagnosis not present

## 2015-12-19 DIAGNOSIS — H5203 Hypermetropia, bilateral: Secondary | ICD-10-CM | POA: Diagnosis not present

## 2015-12-19 DIAGNOSIS — H35033 Hypertensive retinopathy, bilateral: Secondary | ICD-10-CM | POA: Diagnosis not present

## 2015-12-19 DIAGNOSIS — Z9842 Cataract extraction status, left eye: Secondary | ICD-10-CM | POA: Diagnosis not present

## 2015-12-19 DIAGNOSIS — H52223 Regular astigmatism, bilateral: Secondary | ICD-10-CM | POA: Diagnosis not present

## 2015-12-19 DIAGNOSIS — H43811 Vitreous degeneration, right eye: Secondary | ICD-10-CM | POA: Diagnosis not present

## 2015-12-19 DIAGNOSIS — Z961 Presence of intraocular lens: Secondary | ICD-10-CM | POA: Diagnosis not present

## 2015-12-19 DIAGNOSIS — H26491 Other secondary cataract, right eye: Secondary | ICD-10-CM | POA: Diagnosis not present

## 2015-12-19 DIAGNOSIS — Z9841 Cataract extraction status, right eye: Secondary | ICD-10-CM | POA: Diagnosis not present

## 2016-01-05 DIAGNOSIS — Z6827 Body mass index (BMI) 27.0-27.9, adult: Secondary | ICD-10-CM | POA: Diagnosis not present

## 2016-01-05 DIAGNOSIS — N390 Urinary tract infection, site not specified: Secondary | ICD-10-CM | POA: Diagnosis not present

## 2016-02-20 ENCOUNTER — Other Ambulatory Visit: Payer: Self-pay | Admitting: Cardiovascular Disease

## 2016-02-20 ENCOUNTER — Other Ambulatory Visit: Payer: Self-pay | Admitting: Family Medicine

## 2016-03-05 ENCOUNTER — Other Ambulatory Visit: Payer: Self-pay

## 2016-03-16 ENCOUNTER — Ambulatory Visit
Admission: RE | Admit: 2016-03-16 | Discharge: 2016-03-16 | Disposition: A | Discharge status: Home / Self Care | Payer: Medicare Other | Source: Ambulatory Visit

## 2016-03-16 DIAGNOSIS — Z1231 Encounter for screening mammogram for malignant neoplasm of breast: Secondary | ICD-10-CM

## 2016-04-04 ENCOUNTER — Other Ambulatory Visit: Payer: Self-pay | Admitting: Family Medicine

## 2016-04-04 NOTE — Telephone Encounter (Signed)
Rx request to pharmacy/SLS  

## 2016-05-07 ENCOUNTER — Other Ambulatory Visit: Payer: Self-pay | Admitting: Family Medicine

## 2016-06-15 NOTE — Progress Notes (Signed)
Subjective:   Heather [name] is a 81 y.o. female who presents for Medicare Annual (Subsequent) preventive examination.  Review of Systems:  No ROS.  Medicare Wellness Visit.  Cardiac Risk Factors include: advanced age (>70mn, >>25women);diabetes mellitus;dyslipidemia;hypertension;sedentary lifestyle Sleep patterns: Pt states she doesn't sleep well and that PCP is aware. Pt has tried Melatonin. States she sleeps about 5 hrs per night maximum. Home Safety/Smoke Alarms:  Smoke detectors in place.  Living environment; residence and Firearm Safety: Lives at home with husband. No firearms. No stairs. Feels safe. Seat Belt Safety/Bike Helmet: Wears seatbelt.   Counseling:   Eye Exam- Follow with Dr.Nice. States had diabetic eye exam in Feb 2017. Called to confirm and have results faxed over.  Female:   Pap- Hysterectomy     Mammo- last 03/16/16: BI-RADS CATEGORY  1: Negative. F/u in 1 yr per report. Dexa scan- Last 03/28/12:   Normal. F/u in 3-5 years per report. CCS- Last 06/2011 w/ Dr.Medoff      Objective:     Vitals: BP 136/70 (BP Location: Right Arm, Patient Position: Sitting, Cuff Size: Normal)   Pulse 62   Ht 5' 2.5" (1.588 m)   Wt 165 lb 12.8 oz (75.2 kg)   SpO2 97%   BMI 29.84 kg/m   Body mass index is 29.84 kg/m.   Tobacco History  Smoking Status  . Never Smoker  Smokeless Tobacco  . Never Used     Counseling given: Not Answered   Past Medical History:  Diagnosis Date  . Angina   . [place]rial [place]   . Borderline diabetes   . Coronary artery disease    post percutaneous coronary intervention in 2006  . Depression with anxiety 01/05/2012  . Dermatitis   . Diabetes mellitus without complication (HCC)    no meds  . Diarrhea 04/17/2015  . Diverticulitis    hx of  . DJD (degenerative joint disease) of knee 09/18/2011   knees- both, elbow- R, ankles   . Dysrhythmia, cardiac    "palpitations"  . Fatigue   . Gastric ulcer    hx of  . GERD  (gastroesophageal reflux disease)    prilosec daily  . H/O dizziness    pre-syncope/due to dehydration  . H/O hiatal hernia   . Headache 04/17/2015  . Hip pain 08/28/2011  . Hx of total knee replacement 11/12/11   left knee  . Hyperlipidemia   . Hypertension    takes medications daily  . Medicare annual wellness visit, subsequent 11/22/2013  . Occasional tremors   . Persistent disorder of initiating or maintaining sleep   . Restless legs    takes mirapex daily  . S/P inguinal herniorrhaphy   . Stroke ([facility] 2000   TIA- series that have resolved-  . UTI (lower urinary tract infection) 10/04/2013   Past Surgical History:  Procedure Laterality Date  . ABDOMINAL HYSTERECTOMY    . BRAIN SURGERY     related to stroke   2000  . CORONARY ANGIOPLASTY  03/2005; 06/2005   "1" plus "1"= "2 total"  . EYE SURGERY  11-16-13   cataract -right eye, left eye summer 2015  . FOOT SURGERY     left foot stress fracture repair  . HERNIA REPAIR     1989- esophageal hernia   . JOINT REPLACEMENT     left  . KNEE ARTHROSCOPY     2 on left and 1 on right  . TOTAL KNEE ARTHROPLASTY  11/12/2011  Procedure: TOTAL KNEE ARTHROPLASTY;  Surgeon: Lorn Junes, MD;  Location: [facility];  Service: Orthopedics;  Laterality: Left;  Dr Noemi Chapel wants 90 minutes for this case  . TOTAL KNEE ARTHROPLASTY Right 11/29/2014   Procedure: RIGHT TOTAL KNEE ARTHROPLASTY;  Surgeon: Vickey Huger, MD;  Location: [facility];  Service: Orthopedics;  Laterality: Right;  . UPPER GASTROINTESTINAL ENDOSCOPY  2013   Family History  Problem Relation Age of Onset  . Stroke Father     family hx of M 1st degree relative <50  . Coronary artery disease Mother   . Heart disease Mother   . Depression Brother   . Stroke Brother   . Diabetes Brother   . Cancer Brother     bladder with mets  . Diabetes Daughter     borderline  . Hypertension Daughter   . Arthritis      family hx of  . Hypertension      family hx of  . Other      family hx of  cardiovascular disorder  . Thyroid disease Daughter   . Breast cancer Neg Hx   . Colon cancer Neg Hx   . Anesthesia problems Neg Hx   . Hypotension Neg Hx   . Malignant hyperthermia Neg Hx   . Pseudochol deficiency Neg Hx    History  Sexual Activity  . Sexual activity: Yes  . Birth control/ protection: Surgical    Comment: lives with husband, no dietary restrictions    Outpatient Encounter Prescriptions as of 06/18/2016  Medication Sig  . amLODipine (NORVASC) 10 MG tablet TAKE 1/2 TABLET EVERY DAY  . aspirin EC 81 MG tablet Take 1 tablet (81 mg total) by mouth daily.  . Blood Glucose Monitoring Suppl (ONE TOUCH ULTRA SYSTEM KIT) W/DEVICE KIT 1 kit by Does not apply route once. DX: 250.00 Check sugars daily as needed  . cholecalciferol (VITAMIN D) 1000 UNITS tablet Take 1,000 Units by mouth daily.  Marland Kitchen glucose blood (ONE TOUCH TEST STRIPS) test strip Check BS daily and prn DX: E11.9  . isosorbide mononitrate (IMDUR) 60 MG 24 hr tablet TAKE 1/2 TABLET EVERY DAY  . losartan (COZAAR) 100 MG tablet TAKE 1 TABLET EVERY DAY  . magnesium oxide (MAG-OX) 400 MG tablet Take 400 mg by mouth 2 (two) times daily.   . metFORMIN (GLUCOPHAGE) 500 MG tablet TAKE 1 TABLET EVERY DAY WITH BREAKFAST  . metoprolol succinate (TOPROL-XL) 50 MG 24 hr tablet TAKE 1 TABLET TWICE DAILY IMMEDIATELY FOLLOWING A MEAL  . omeprazole (PRILOSEC) 20 MG capsule Take 20 mg by mouth 2 (two) times daily before a meal.  . ONETOUCH DELICA LANCETS FINE MISC Check sugars daily and prn  DX 250.00  . potassium chloride SA (K-DUR,KLOR-CON) 20 MEQ tablet TAKE 1 TABLET EVERY DAY  . pramipexole (MIRAPEX) 0.25 MG tablet TAKE 1 TABLET TWICE DAILY  . simvastatin (ZOCOR) 20 MG tablet TAKE 1 TABLET AT BEDTIME  . triamterene-hydrochlorothiazide (MAXZIDE-25) 37.5-25 MG tablet TAKE 1 TABLET EVERY DAY  . [DISCONTINUED] traMADol (ULTRAM) 50 MG tablet Take 1 tablet (50 mg total) by mouth every 6 (six) hours as needed.   No  facility-administered encounter medications on file as of 06/18/2016.     Activities of Daily Living In your present state of health, do you have any difficulty performing the following activities: 06/18/2016  Hearing? Y  Vision? N  Difficulty concentrating or making decisions? N  Walking or climbing stairs? N  Dressing or bathing? N  Doing errands,  shopping? N  Preparing Food and eating ? N  Using the Toilet? N  In the past six months, have you accidently leaked urine? Y  Do you have problems with loss of bowel control? N  Managing your Medications? N  Managing your Finances? N  Housekeeping or managing your Housekeeping? N  Some recent data might be hidden    Patient Care Team: Mosie Lukes, MD as PCP - General (Family Medicine) Idelle Leech, OD (Optometry)    Assessment:    Physical assessment deferred to PCP.  Exercise Activities and Dietary recommendations Current Exercise Habits: The patient does not participate in regular exercise at present, Exercise limited by: None identified  Diet 24 HOUR RECALL: Breakfast: 2 muffins, decaf coffee Lunch: vegetable soup, milk Dinner: pork, lima beans, potatoes, baked apples Only drinks 1 bottle of water per day. Encouraged to increase water intake.   Goals    . Increase physical activity          Begin using exercise bike 3x/wk. 1 mile each time.    . Increase water intake    . Weight (lb) < 150 lb (68 kg)      Fall Risk Fall Risk  06/18/2016 03/05/2016 01/18/2015 12/22/2014 11/17/2013  Falls in the past year? No No Yes No No  Number falls in past yr: - - 1 - -  Injury with Fall? - - No - -  Risk for fall due to : - - Other (Comment) - -  Risk for fall due to (comments): - - tripped - -   Depression Screen PHQ 2/9 Scores 06/18/2016 12/22/2014 11/17/2013  PHQ - 2 Score 0 2 0  PHQ- 9 Score - 8 -     Cognitive Function MMSE - Mini Mental State Exam 06/18/2016  Orientation to time 5  Orientation to Place 5    Registration 3  Attention/ Calculation 5  Recall 2  Language- name 2 objects 2  Language- repeat 1  Language- follow 3 step command 3  Language- read & follow direction 1  Write a sentence 1  Copy design 1  Total score 29        Immunization History  Administered Date(s) Administered  . Influenza Whole 06/08/2005, 06/29/2010  . Influenza, High Dose Seasonal PF 04/05/2015, 06/18/2016  . Influenza,inj,Quad PF,36+ Mos 04/30/2014  . Pneumococcal Conjugate-13 11/17/2013  . Pneumococcal Polysaccharide-23 07/09/2000  . Tdap 05/24/2015   Screening Tests Health Maintenance  Topic Date Due  . OPHTHALMOLOGY EXAM  10/07/1944  . HEMOGLOBIN A1C  10/03/2015  . PNA vac Low Risk Adult (2 of 2 - PPSV23) 07/19/2016 (Originally 11/18/2014)  . FOOT EXAM  06/18/2017  . TETANUS/TDAP  05/23/2025  . INFLUENZA VACCINE  Completed  . DEXA SCAN  Completed  . ZOSTAVAX  Addressed      Plan:     Bring a copy of your advance directives to your next office visit. Continue to eat heart healthy diet (full of fruits, vegetables, whole grains, lean protein, water--limit salt, fat, and sugar intake) and increase physical activity as tolerated. Begin using your exercise bike or treadmill 3x/week (1 mile each time). Follow up with Audiology.    During the course of the visit the patient was educated and counseled about the following appropriate screening and preventive services:   Vaccines to include Pneumoccal, Influenza, Hepatitis B, Td, Zostavax, HCV  Cardiovascular Disease  Colorectal cancer screening  Bone density screening  Diabetes screening  Glaucoma screening  Mammography/PAP  Nutrition counseling  Patient Instructions (the written plan) was given to the patient.   Shela Nevin, [place]  06/18/2016

## 2016-06-15 NOTE — Progress Notes (Signed)
Pre visit review using our clinic review tool, if applicable. No additional management support is needed unless otherwise documented below in the visit note. 

## 2016-06-18 ENCOUNTER — Ambulatory Visit (INDEPENDENT_AMBULATORY_CARE_PROVIDER_SITE_OTHER): Payer: Medicare Other | Admitting: *Deleted

## 2016-06-18 ENCOUNTER — Encounter: Payer: Self-pay | Admitting: *Deleted

## 2016-06-18 VITALS — BP 136/70 | HR 62 | Ht 62.5 in | Wt 165.8 lb

## 2016-06-18 DIAGNOSIS — H9193 Unspecified hearing loss, bilateral: Secondary | ICD-10-CM | POA: Diagnosis not present

## 2016-06-18 DIAGNOSIS — Z0000 Encounter for general adult medical examination without abnormal findings: Secondary | ICD-10-CM | POA: Diagnosis not present

## 2016-06-18 DIAGNOSIS — Z23 Encounter for immunization: Secondary | ICD-10-CM | POA: Diagnosis not present

## 2016-06-18 NOTE — Progress Notes (Signed)
RN AWV note reviewed. Agree with documention and plan. 

## 2016-06-18 NOTE — Patient Instructions (Signed)
Bring a copy of your advance directives to your next office visit. Continue to eat heart healthy diet (full of fruits, vegetables, whole grains, lean protein, water--limit salt, fat, and sugar intake) and increase physical activity as tolerated. Begin using your exercise bike or treadmill 3x/week (1 mile each time). Follow up with Audiology.

## 2016-06-22 ENCOUNTER — Encounter: Payer: Self-pay | Admitting: Family Medicine

## 2016-08-07 ENCOUNTER — Ambulatory Visit: Payer: Medicare Other | Admitting: Audiology

## 2016-08-15 ENCOUNTER — Encounter: Payer: Self-pay | Admitting: Cardiovascular Disease

## 2016-08-15 ENCOUNTER — Ambulatory Visit (INDEPENDENT_AMBULATORY_CARE_PROVIDER_SITE_OTHER): Payer: Medicare Other | Admitting: Cardiovascular Disease

## 2016-08-15 VITALS — BP 162/86 | HR 64 | Ht 63.0 in | Wt 167.0 lb

## 2016-08-15 DIAGNOSIS — I10 Essential (primary) hypertension: Secondary | ICD-10-CM | POA: Diagnosis not present

## 2016-08-15 DIAGNOSIS — E785 Hyperlipidemia, unspecified: Secondary | ICD-10-CM

## 2016-08-15 DIAGNOSIS — I2510 Atherosclerotic heart disease of native coronary artery without angina pectoris: Secondary | ICD-10-CM | POA: Diagnosis not present

## 2016-08-15 NOTE — Progress Notes (Signed)
Cardiology Office Note Date:  08/15/2016   ID:  Heather [name], Heather [name] [birth year], MRN [id number]  PCP:  Heather Homans, MD  Cardiologist:  Heather Mocha, MD    Chief Complaint  Patient presents with  . Essential Hypertension     History of Present Illness: Heather [name] is a 82 y.o. female who presents for  follow-up of coronary artery disease. She underwent multivessel stenting in 2006. She has done very well since that time from a cardiac perspective.   The patient is here alone today. She is doing well without symptoms of chest pain or shortness of breath. The patient had a postoperative DVT after undergoing total knee replacement in 2016. She took Xarelto for 6 months and has had no recurrent issues. She reports episodes of weakness that generally occur when she is standing up. When she checks her blood pressure it is low. This only occurs on an occasional basis. She has had some systolic blood pressure readings less than 100.   Past Medical History:  Diagnosis Date  . Angina   . Atrial fibrillation ([name])   . Borderline diabetes   . Coronary artery disease    post percutaneous coronary intervention in 2006  . Depression with anxiety 01/05/2012  . Dermatitis   . Diabetes mellitus without complication (HCC)    no meds  . Diarrhea 04/17/2015  . Diverticulitis    hx of  . DJD (degenerative joint disease) of knee 09/18/2011   knees- both, elbow- R, ankles   . Dysrhythmia, cardiac    "palpitations"  . Fatigue   . Gastric ulcer    hx of  . GERD (gastroesophageal reflux disease)    prilosec daily  . H/O dizziness    pre-syncope/due to dehydration  . H/O hiatal hernia   . Headache 04/17/2015  . Hip pain 08/28/2011  . Hx of total knee replacement 11/12/11   left knee  . Hyperlipidemia   . Hypertension    takes medications daily  . Medicare annual wellness visit, subsequent 11/22/2013  . Occasional tremors   . Persistent disorder of initiating or maintaining sleep   . Restless  legs    takes mirapex daily  . S/P inguinal herniorrhaphy   . Stroke [facility]) 2000   TIA- series that have resolved-  . UTI (lower urinary tract infection) 10/04/2013    Past Surgical History:  Procedure Laterality Date  . ABDOMINAL HYSTERECTOMY    . BRAIN SURGERY     related to stroke   2000  . CORONARY ANGIOPLASTY  03/2005; 06/2005   "1" plus "1"= "2 total"  . EYE SURGERY  11-16-13   cataract -right eye, left eye summer 2015  . FOOT SURGERY     left foot stress fracture repair  . HERNIA REPAIR     1989- esophageal hernia   . JOINT REPLACEMENT     left  . KNEE ARTHROSCOPY     2 on left and 1 on right  . TOTAL KNEE ARTHROPLASTY  11/12/2011   Procedure: TOTAL KNEE ARTHROPLASTY;  Surgeon: Lorn Junes, MD;  Location: [facility];  Service: Orthopedics;  Laterality: Left;  Dr Noemi Chapel wants 90 minutes for this case  . TOTAL KNEE ARTHROPLASTY Right 11/29/2014   Procedure: RIGHT TOTAL KNEE ARTHROPLASTY;  Surgeon: Vickey Huger, MD;  Location: [facility];  Service: Orthopedics;  Laterality: Right;  . UPPER GASTROINTESTINAL ENDOSCOPY  2013    Current Outpatient Prescriptions  Medication Sig Dispense Refill  . amLODipine (NORVASC) 10  MG tablet Take 5 mg by mouth daily.    Marland Kitchen aspirin EC 81 MG tablet Take 1 tablet (81 mg total) by mouth daily.    . Blood Glucose Monitoring Suppl (ONE TOUCH ULTRA SYSTEM KIT) W/DEVICE KIT 1 kit by Does not apply route once. DX: 250.00 Check sugars daily as needed 1 each 0  . cholecalciferol (VITAMIN D) 1000 UNITS tablet Take 1,000 Units by mouth daily.    . diclofenac sodium (VOLTAREN) 1 % GEL Apply 2 g topically 3 (three) times daily as needed for pain.    Marland Kitchen glucose blood (ONE TOUCH TEST STRIPS) test strip Check BS daily and prn DX: E11.9 100 each 11  . isosorbide mononitrate (IMDUR) 60 MG 24 hr tablet Take 30 mg by mouth daily.    Marland Kitchen losartan (COZAAR) 100 MG tablet Take 100 mg by mouth daily.    . magnesium oxide (MAG-OX) 400 MG tablet Take 400 mg by mouth 2 (two) times  daily.     . metFORMIN (GLUCOPHAGE) 500 MG tablet Take 500 mg by mouth daily with breakfast.    . metoprolol succinate (TOPROL-XL) 50 MG 24 hr tablet Take 50 mg by mouth 2 (two) times daily. Take with or immediately following a meal.    . omeprazole (PRILOSEC) 20 MG capsule Take 20 mg by mouth 2 (two) times daily before a meal.    . ONETOUCH DELICA LANCETS FINE MISC Check sugars daily and prn  DX 250.00 100 each 1  . potassium chloride SA (K-DUR,KLOR-CON) 20 MEQ tablet Take 20 mEq by mouth daily.    . pramipexole (MIRAPEX) 0.25 MG tablet Take 0.25 mg by mouth 2 (two) times daily.    . simvastatin (ZOCOR) 20 MG tablet Take 20 mg by mouth daily.    Marland Kitchen triamterene-hydrochlorothiazide (MAXZIDE-25) 37.5-25 MG tablet Take 1 tablet by mouth daily.     No current facility-administered medications for this visit.     Allergies:   Baclofen   Social History:  The patient  reports that she has never smoked. She has never used smokeless tobacco. She reports that she does not drink alcohol or use drugs.   Family History:  The patient's family history includes Cancer in her brother; Coronary artery disease in her mother; Depression in her brother; Diabetes in her brother and daughter; Heart disease in her mother; Hypertension in her daughter; Stroke in her brother and father; Thyroid disease in her daughter.    ROS:  Please see the history of present illness.  Otherwise, review of systems is positive for hearing loss, nausea/vomiting.  All other systems are reviewed and negative.    PHYSICAL EXAM: VS:  BP (!) 162/86   Pulse 64   Ht _0  (1.6 m)   Wt 167 lb (75.8 kg)   BMI 29.58 kg/m  , BMI Body mass index is 29.58 kg/m. GEN: Well nourished, well developed, in no acute distress  HEENT: normal  Neck: no JVD, no masses. No carotid bruits Cardiac: RRR without murmur or gallop                Respiratory:  clear to auscultation bilaterally, normal work of breathing GI: soft, nontender, nondistended,  + BS MS: no deformity or atrophy  Ext: no pretibial edema, pedal pulses 2+= bilaterally Skin: warm and dry, no rash Neuro:  Strength and sensation are intact Psych: euthymic mood, full affect  EKG:  EKG is ordered today. The ekg ordered today shows NSR with RBBB and LAFB  Recent Labs: No results found for requested labs within last 8760 hours.   Lipid Panel     Component Value Date/Time   CHOL 151 04/05/2015 1235   TRIG 153.0 (H) 04/05/2015 1235   HDL 59.90 04/05/2015 1235   CHOLHDL 3 04/05/2015 1235   VLDL 30.6 04/05/2015 1235   LDLCALC 60 04/05/2015 1235   LDLDIRECT 73.4 05/20/2014 1651      Wt Readings from Last 3 Encounters:  08/15/16 167 lb (75.8 kg)  06/18/16 165 lb 12.8 oz (75.2 kg)  07/01/15 152 lb (68.9 kg)     ASSESSMENT AND PLAN: 1.  Coronary artery disease, native vessel, without symptoms of angina: The patient appears to be stable and her current medicines will be continued without change.  2. Hypertension, uncontrolled: I reviewed her medical program. She is on multidrug antihypertensive therapy. I think it is best to continue her current medicines without escalating her antihypertensives since she has periodic episodes of low blood pressure. We discussed increasing her fluid intake and avoiding salt.  3. Hyperlipidemia: The patient is treated with a statin drug. Lipids as above. LDL cholesterol has been at goal of less than 70 mg/dL.   Current medicines are reviewed with the patient today.  The patient does not have concerns regarding medicines.  Labs/ tests ordered today include:  No orders of the defined types were placed in this encounter.   Disposition:   FU one year  Signed, Heather Mocha, MD  08/15/2016 1:49 PM    [facility] [address], [place], [place]  [address] Phone: [phone]; Fax: [phone]

## 2016-08-15 NOTE — Patient Instructions (Signed)

## 2016-08-17 ENCOUNTER — Other Ambulatory Visit: Payer: Self-pay

## 2016-08-17 DIAGNOSIS — I10 Essential (primary) hypertension: Secondary | ICD-10-CM

## 2016-08-31 ENCOUNTER — Other Ambulatory Visit: Payer: Self-pay

## 2016-08-31 DIAGNOSIS — I10 Essential (primary) hypertension: Secondary | ICD-10-CM

## 2016-09-21 ENCOUNTER — Encounter: Payer: Self-pay | Admitting: Family Medicine

## 2016-09-21 ENCOUNTER — Other Ambulatory Visit: Payer: Self-pay | Admitting: Family Medicine

## 2016-09-21 ENCOUNTER — Telehealth: Payer: Self-pay | Admitting: Family Medicine

## 2016-09-21 ENCOUNTER — Other Ambulatory Visit (INDEPENDENT_AMBULATORY_CARE_PROVIDER_SITE_OTHER): Payer: Medicare Other

## 2016-09-21 DIAGNOSIS — R3915 Urgency of urination: Secondary | ICD-10-CM

## 2016-09-21 DIAGNOSIS — R300 Dysuria: Secondary | ICD-10-CM | POA: Diagnosis not present

## 2016-09-21 LAB — URINALYSIS, ROUTINE W REFLEX MICROSCOPIC
BILIRUBIN URINE: NEGATIVE
KETONES UR: NEGATIVE
NITRITE: NEGATIVE
PH: 5.5 (ref 5.0–8.0)
Specific Gravity, Urine: <=1.005 — AB (ref 1.000–1.030)
Total Protein, Urine: NEGATIVE
UROBILINOGEN UA: 0.2 (ref 0.0–1.0)
Urine Glucose: NEGATIVE

## 2016-09-21 MED ORDER — AMOXICILLIN 500 MG PO CAPS: 500.0000 mg | ORAL_CAPSULE | Freq: Two times a day (BID) | ORAL | 0 refills | Status: DC

## 2016-09-21 NOTE — Telephone Encounter (Signed)
Informed PCP of symptoms and she agreed to have her come to the office and leave a urine sample. Put in order/appt. Schedule for dysuria/urgency. Spoke to her husband and he will let her know ok to come to the office/lab leave a sample/will call back with results.

## 2016-09-21 NOTE — Telephone Encounter (Signed)
Spoke to the patient and she is having burning/urgency since last night. Advise if ok come in and leave a sample

## 2016-09-21 NOTE — Telephone Encounter (Signed)
Caller name: Relationship to patient: Self Can be reached: [phone] Pharmacy:  Reason for call: Patient thinks she has another UTI and request to come in today to leave a urine sample to be tested. Plse adv

## 2016-09-24 ENCOUNTER — Telehealth: Payer: Self-pay | Admitting: Family Medicine

## 2016-09-24 DIAGNOSIS — B370 Candidal stomatitis: Secondary | ICD-10-CM | POA: Diagnosis not present

## 2016-09-24 LAB — URINE CULTURE

## 2016-09-24 MED ORDER — SULFAMETHOXAZOLE-TRIMETHOPRIM 400-80 MG PO TABS: 1.0000 | ORAL_TABLET | Freq: Two times a day (BID) | ORAL | 0 refills | Status: DC

## 2016-09-24 NOTE — Telephone Encounter (Signed)
OK put amox in her allergy list and send in Bactrim DS 1 tab po bid x 3 days unless the UC gave her something

## 2016-09-24 NOTE — Telephone Encounter (Signed)
Caller name:Gillespie,Charles E Relation to ZM:CEYEMV  Call back number:  [phone]  Pharmacy: [facility], [place] [address] [phone]     Reason for call:  As per spouse patient mouth is sore and swollen from amoxicillin (AMOXIL) 500 MG capsule, patient d/c amoxicillin (AMOXIL) 500 MG capsule on Friday.   Patient was seen in [facility] urgent care by Dr. Estill Bamberg [phone] for swollen mouth, urgent care prescribed mouth wash.

## 2016-09-24 NOTE — Telephone Encounter (Signed)
Updated medication list and allergy list. Sent in bactrim to pharmacy [place]. Patient has been informed.

## 2016-09-26 DIAGNOSIS — K120 Recurrent oral aphthae: Secondary | ICD-10-CM | POA: Diagnosis not present

## 2016-09-26 DIAGNOSIS — N390 Urinary tract infection, site not specified: Secondary | ICD-10-CM | POA: Diagnosis not present

## 2016-09-26 DIAGNOSIS — B370 Candidal stomatitis: Secondary | ICD-10-CM | POA: Diagnosis not present

## 2016-09-26 DIAGNOSIS — I10 Essential (primary) hypertension: Secondary | ICD-10-CM | POA: Diagnosis not present

## 2016-10-03 ENCOUNTER — Other Ambulatory Visit: Payer: Self-pay | Admitting: Family Medicine

## 2016-10-03 ENCOUNTER — Other Ambulatory Visit: Payer: Self-pay | Admitting: Cardiovascular Disease

## 2016-10-15 ENCOUNTER — Ambulatory Visit: Payer: Medicare Other | Admitting: Audiology

## 2016-10-22 ENCOUNTER — Emergency Department (HOSPITAL_BASED_OUTPATIENT_CLINIC_OR_DEPARTMENT_OTHER): Payer: Medicare Other

## 2016-10-22 ENCOUNTER — Encounter (HOSPITAL_BASED_OUTPATIENT_CLINIC_OR_DEPARTMENT_OTHER): Payer: Self-pay | Admitting: *Deleted

## 2016-10-22 ENCOUNTER — Emergency Department (HOSPITAL_BASED_OUTPATIENT_CLINIC_OR_DEPARTMENT_OTHER)
Admission: EM | Admit: 2016-10-22 | Discharge: 2016-10-22 | Disposition: A | Discharge status: Home / Self Care | Payer: Medicare Other | Attending: Emergency Medicine | Admitting: Emergency Medicine

## 2016-10-22 DIAGNOSIS — R1013 Epigastric pain: Secondary | ICD-10-CM

## 2016-10-22 DIAGNOSIS — Z79899 Other long term (current) drug therapy: Secondary | ICD-10-CM | POA: Diagnosis not present

## 2016-10-22 DIAGNOSIS — I2510 Atherosclerotic heart disease of native coronary artery without angina pectoris: Secondary | ICD-10-CM | POA: Diagnosis not present

## 2016-10-22 DIAGNOSIS — K439 Ventral hernia without obstruction or gangrene: Secondary | ICD-10-CM | POA: Insufficient documentation

## 2016-10-22 DIAGNOSIS — K8020 Calculus of gallbladder without cholecystitis without obstruction: Secondary | ICD-10-CM | POA: Diagnosis not present

## 2016-10-22 DIAGNOSIS — I10 Essential (primary) hypertension: Secondary | ICD-10-CM | POA: Diagnosis not present

## 2016-10-22 DIAGNOSIS — Z7982 Long term (current) use of aspirin: Secondary | ICD-10-CM | POA: Diagnosis not present

## 2016-10-22 DIAGNOSIS — Z7984 Long term (current) use of oral hypoglycemic drugs: Secondary | ICD-10-CM | POA: Diagnosis not present

## 2016-10-22 LAB — URINALYSIS, ROUTINE W REFLEX MICROSCOPIC
BILIRUBIN URINE: NEGATIVE
Glucose, UA: NEGATIVE mg/dL
HGB URINE DIPSTICK: NEGATIVE
KETONES UR: NEGATIVE mg/dL
Leukocytes, UA: NEGATIVE
NITRITE: NEGATIVE
PH: 7.5 (ref 5.0–8.0)
Protein, ur: NEGATIVE mg/dL
SPECIFIC GRAVITY, URINE: 1.009 (ref 1.005–1.030)

## 2016-10-22 LAB — COMPREHENSIVE METABOLIC PANEL
ALBUMIN: 4.3 g/dL (ref 3.5–5.0)
ALK PHOS: 79 U/L (ref 38–126)
ALT: 14 U/L (ref 14–54)
AST: 21 U/L (ref 15–41)
Anion gap: 9 (ref 5–15)
BILIRUBIN TOTAL: 0.8 mg/dL (ref 0.3–1.2)
BUN: 28 mg/dL — AB (ref 6–20)
CALCIUM: 9.4 mg/dL (ref 8.9–10.3)
CO2: 25 mmol/L (ref 22–32)
CREATININE: 1.24 mg/dL — AB (ref 0.44–1.00)
Chloride: 104 mmol/L (ref 101–111)
GFR calc Af Amer: 46 mL/min — ABNORMAL LOW (ref >60)
GFR calc non Af Amer: 40 mL/min — ABNORMAL LOW (ref >60)
GLUCOSE: 120 mg/dL — AB (ref 65–99)
Potassium: 4.3 mmol/L (ref 3.5–5.1)
SODIUM: 138 mmol/L (ref 135–145)
TOTAL PROTEIN: 6.7 g/dL (ref 6.5–8.1)

## 2016-10-22 LAB — CBC WITH DIFFERENTIAL/PLATELET
BASOS ABS: 0 10*3/uL (ref 0.0–0.1)
BASOS PCT: 0 %
EOS PCT: 3 %
Eosinophils Absolute: 0.2 10*3/uL (ref 0.0–0.7)
HEMATOCRIT: 37.1 % (ref 36.0–46.0)
Hemoglobin: 12.5 g/dL (ref 12.0–15.0)
Lymphocytes Relative: 24 %
Lymphs Abs: 1.4 10*3/uL (ref 0.7–4.0)
MCH: 30.7 pg (ref 26.0–34.0)
MCHC: 33.7 g/dL (ref 30.0–36.0)
MCV: 91.2 fL (ref 78.0–100.0)
MONO ABS: 0.6 10*3/uL (ref 0.1–1.0)
MONOS PCT: 10 %
Neutro Abs: 3.5 10*3/uL (ref 1.7–7.7)
Neutrophils Relative %: 63 %
PLATELETS: 153 10*3/uL (ref 150–400)
RBC: 4.07 MIL/uL (ref 3.87–5.11)
RDW: 12.7 % (ref 11.5–15.5)
WBC: 5.7 10*3/uL (ref 4.0–10.5)

## 2016-10-22 LAB — LIPASE, BLOOD: Lipase: 42 U/L (ref 11–51)

## 2016-10-22 MED ORDER — IOPAMIDOL (ISOVUE-300) INJECTION 61%
100.0000 mL | Freq: Once | INTRAVENOUS | Status: AC | PRN
  Administered 2016-10-22: 80 mL via INTRAVENOUS

## 2016-10-22 MED ORDER — HYDROCODONE-ACETAMINOPHEN 5-325 MG PO TABS: 1.0000 | ORAL_TABLET | Freq: Four times a day (QID) | ORAL | 0 refills | Status: DC | PRN

## 2016-10-22 MED ORDER — SODIUM CHLORIDE 0.9 % IV BOLUS (SEPSIS)
500.0000 mL | Freq: Once | INTRAVENOUS | Status: AC
  Administered 2016-10-22: 500 mL via INTRAVENOUS

## 2016-10-22 MED ORDER — IOPAMIDOL (ISOVUE-300) INJECTION 61%: 80.0000 mL | Freq: Once | INTRAVENOUS | Status: DC | PRN

## 2016-10-22 NOTE — Discharge Instructions (Signed)
Hydrocodone as prescribed as needed for pain.  Follow-up with [facility] [place] [facility] if not improving in the next 3 days, and return to the emergency department if symptoms significantly worsen or change in the meantime.

## 2016-10-22 NOTE — ED Notes (Signed)
ED Provider at bedside. 

## 2016-10-22 NOTE — ED Provider Notes (Signed)
[place] DEPT [facility] Provider Note   CSN: [id number] Arrival date & time: 10/22/16  1306  By signing my name below, I, Neta Mends, attest that this documentation has been prepared under the direction and in the presence of Veryl Speak, MD . Electronically Signed: Neta Mends, ED Scribe. 10/22/2016. 3:09 PM.    History   Chief Complaint Chief Complaint  Patient presents with  . Abdominal Pain    The history is provided by the patient. No language interpreter was used.  Abdominal Pain   The current episode started 6 to 12 hours ago. The problem occurs constantly. The problem has not changed since onset.Pertinent negatives include fever, nausea and vomiting. Nothing relieves the symptoms.   HPI Comments:  Heather [name] is a 82 y.o. female who presents to the Emergency Department complaining of constant upper abdominal pain that began this AM. Pt reports that she felt bloated when she woke up this morning at the onset of pain. She has not had a BM today, and has only eaten a banana today. She reports a hx of multiple hernias and stomach ulcers, and states that this pain is similar to a previous esophogeal hernia. She has had 2 coronary stents placed. Pt takes prilosec regularly. Denies nausea, vomiting, blood in stool.   Past Medical History:  Diagnosis Date  . Angina   . Atrial fibrillation ([place])   . Borderline diabetes   . Coronary artery disease    post percutaneous coronary intervention in 2006  . Depression with anxiety 01/05/2012  . Dermatitis   . Diabetes mellitus without complication (HCC)    no meds  . Diarrhea 04/17/2015  . Diverticulitis    hx of  . DJD (degenerative joint disease) of knee 09/18/2011   knees- both, elbow- R, ankles   . Dysrhythmia, cardiac    "palpitations"  . Fatigue   . Gastric ulcer    hx of  . GERD (gastroesophageal reflux disease)    prilosec daily  . H/O dizziness    pre-syncope/due to dehydration  . H/O hiatal hernia     . Headache 04/17/2015  . Hip pain 08/28/2011  . Hx of total knee replacement 11/12/11   left knee  . Hyperlipidemia   . Hypertension    takes medications daily  . Medicare annual wellness visit, subsequent 11/22/2013  . Occasional tremors   . Persistent disorder of initiating or maintaining sleep   . Restless legs    takes mirapex daily  . S/P inguinal herniorrhaphy   . Stroke [facility]) 2000   TIA- series that have resolved-  . UTI (lower urinary tract infection) 10/04/2013    Patient Active Problem List   Diagnosis Date Noted  . Right shoulder pain 04/29/2015  . Diarrhea 04/17/2015  . Headache 04/17/2015  . Multinodular goiter 01/17/2015  . Peroneal DVT (deep venous thrombosis) ([place]) 12/22/2014  . S/P total knee arthroplasty 11/29/2014  . Arthritis of knee, degenerative 11/19/2014  . Neck pain 10/29/2014  . Insomnia 02/05/2014  . Medicare annual wellness visit, subsequent 11/22/2013  . RLS (restless legs syndrome) 10/04/2013  . UTI (lower urinary tract infection) 10/04/2013  . Cataracts, bilateral 04/02/2013  . Leg swelling 01/17/2012  . Hypokalemia 01/05/2012  . Anemia 01/05/2012  . Depression with anxiety 01/05/2012  . Postoperative anemia due to acute blood loss 11/15/2011  . 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 Pre-syncope 10/13/2011  . DM (diabetes mellitus) ([place]) 10/06/2011  . Pulmonary nodule 10/06/2011  . Abdominal pain, other  specified site 10/06/2011  . DJD (degenerative joint disease) of knee 09/18/2011  . Hip pain 08/28/2011  . Renal insufficiency 08/28/2011  . CORONARY ATHEROSCLEROSIS NATIVE CORONARY ARTERY 03/07/2010  . Coronary atherosclerosis 01/18/2009  . FATIGUE 01/18/2009  . PERSISTENT DISORDER INITIATING/MAINTAINING SLEEP 09/09/2008  . DERMATITIS 10/16/2007  . PERIPHERAL EDEMA 10/16/2007  . PALPITATIONS 10/16/2007  . Hyperlipidemia 04/14/2007  . Essential hypertension 04/14/2007  . GERD 04/14/2007  . TRANSIENT ISCHEMIC ATTACK, HX OF  04/14/2007    Past Surgical History:  Procedure Laterality Date  . ABDOMINAL HYSTERECTOMY    . BRAIN SURGERY     related to stroke   2000  . CORONARY ANGIOPLASTY  03/2005; 06/2005   "1" plus "1"= "2 total"  . EYE SURGERY  11-16-13   cataract -right eye, left eye summer 2015  . FOOT SURGERY     left foot stress fracture repair  . HERNIA REPAIR     1989- esophageal hernia   . JOINT REPLACEMENT     left  . KNEE ARTHROSCOPY     2 on left and 1 on right  . TOTAL KNEE ARTHROPLASTY  11/12/2011   Procedure: TOTAL KNEE ARTHROPLASTY;  Surgeon: Lorn Junes, MD;  Location: [place];  Service: Orthopedics;  Laterality: Left;  Dr Noemi Chapel wants 90 minutes for this case  . TOTAL KNEE ARTHROPLASTY Right 11/29/2014   Procedure: RIGHT TOTAL KNEE ARTHROPLASTY;  Surgeon: Vickey Huger, MD;  Location: [place];  Service: Orthopedics;  Laterality: Right;  . UPPER GASTROINTESTINAL ENDOSCOPY  2013    OB History    No data available       Home Medications    Prior to Admission medications   Medication Sig Start Date End Date Taking? Authorizing Provider  amLODipine (NORVASC) 10 MG tablet TAKE 1/2 TABLET EVERY DAY 10/03/16   Mosie Lukes, MD  aspirin EC 81 MG tablet Take 1 tablet (81 mg total) by mouth daily. 05/04/15   [name], MD  Blood Glucose Monitoring Suppl (ONE TOUCH ULTRA SYSTEM KIT) W/DEVICE KIT 1 kit by Does not apply route once. DX: 250.00 Check sugars daily as needed 01/01/14   Mosie Lukes, MD  cholecalciferol (VITAMIN D) 1000 UNITS tablet Take 1,000 Units by mouth daily.    Historical Provider, MD  diclofenac sodium (VOLTAREN) 1 % GEL Apply 2 g topically 3 (three) times daily as needed for pain. 06/23/16   Historical Provider, MD  glucose blood (ONE TOUCH TEST STRIPS) test strip Check BS daily and prn DX: E11.9 08/19/14   Mosie Lukes, MD  isosorbide mononitrate (IMDUR) 60 MG 24 hr tablet TAKE 1/2 TABLET EVERY DAY 10/04/16   [name], MD  losartan (COZAAR) 100 MG tablet TAKE 1  TABLET EVERY DAY 10/03/16   Mosie Lukes, MD  magnesium oxide (MAG-OX) 400 MG tablet Take 400 mg by mouth 2 (two) times daily.     Historical Provider, MD  metFORMIN (GLUCOPHAGE) 500 MG tablet TAKE 1 TABLET EVERY DAY WITH BREAKFAST 10/03/16   Mosie Lukes, MD  metoprolol succinate (TOPROL-XL) 50 MG 24 hr tablet TAKE 1 TABLET TWICE DAILY IMMEDIATELY FOLLOWING A MEAL 10/04/16   [name], MD  omeprazole (PRILOSEC) 20 MG capsule Take 20 mg by mouth 2 (two) times daily before a meal.    Historical Provider, MD  [facility] DELICA LANCETS FINE MISC Check sugars daily and prn  DX 250.00 01/05/14   Mosie Lukes, MD  potassium chloride SA (K-DUR,KLOR-CON) 20 MEQ tablet TAKE  1 TABLET EVERY DAY 10/04/16   [name], MD  pramipexole (MIRAPEX) 0.25 MG tablet TAKE 1 TABLET TWICE DAILY 10/03/16   Mosie Lukes, MD  simvastatin (ZOCOR) 20 MG tablet TAKE 1 TABLET AT BEDTIME 10/04/16   [name], MD  triamterene-hydrochlorothiazide [facility]) 37.5-25 MG tablet TAKE 1 TABLET EVERY DAY 10/03/16   Mosie Lukes, MD    Family History Family History  Problem Relation Age of Onset  . Stroke Father     family hx of M 1st degree relative <50  . Coronary artery disease Mother   . Heart disease Mother   . Depression Brother   . Stroke Brother   . Diabetes Brother   . Cancer Brother     bladder with mets  . Diabetes Daughter     borderline  . Hypertension Daughter   . Arthritis      family hx of  . Hypertension      family hx of  . Other      family hx of cardiovascular disorder  . Thyroid disease Daughter   . Breast cancer Neg Hx   . Colon cancer Neg Hx   . Anesthesia problems Neg Hx   . Hypotension Neg Hx   . Malignant hyperthermia Neg Hx   . Pseudochol deficiency Neg Hx     Social History Social History  Substance Use Topics  . Smoking status: Never Smoker  . Smokeless tobacco: Never Used  . Alcohol use No     Allergies   Amoxicillin and Baclofen   Review of  Systems Review of Systems  Constitutional: Negative for fever.  Gastrointestinal: Positive for abdominal pain. Negative for blood in stool, nausea and vomiting.  All other systems reviewed and are negative.    Physical Exam Updated Vital Signs BP (!) 158/79   Pulse 60   Temp 97.9 F (36.6 C) (Oral)   Resp 16   Ht '5\' 3"'  (1.6 m)   Wt 160 lb (72.6 kg)   SpO2 98%   BMI 28.34 kg/m   Physical Exam  Constitutional: She is oriented to person, place, and time. She appears well-developed and well-nourished. No distress.  HENT:  Head: Normocephalic and atraumatic.  Eyes: EOM are normal.  Neck: Normal range of motion.  Cardiovascular: Normal rate, regular rhythm and normal heart sounds.   Pulmonary/Chest: Effort normal and breath sounds normal.  Abdominal: Soft. Bowel sounds are normal. She exhibits no distension. There is tenderness. There is no rebound and no guarding.  TTP in the RUQ  Musculoskeletal: Normal range of motion.  Neurological: She is alert and oriented to person, place, and time.  Skin: Skin is warm and dry.  Psychiatric: She has a normal mood and affect. Judgment normal.  Nursing note and vitals reviewed.    ED Treatments / Results  DIAGNOSTIC STUDIES:  Oxygen Saturation is 98% on RA, normal by my interpretation.    COORDINATION OF CARE:  3:06 PM Will order CT scan. Discussed treatment plan with pt at bedside and pt agreed to plan.   Labs (all labs ordered are listed, but only abnormal results are displayed) Labs Reviewed  CBC WITH DIFFERENTIAL/PLATELET  COMPREHENSIVE METABOLIC PANEL  LIPASE, BLOOD  URINALYSIS, ROUTINE W REFLEX MICROSCOPIC    EKG  EKG Interpretation None       Radiology No results found.  Procedures Procedures (including critical care time)  Medications Ordered in ED Medications - No data to display   Initial Impression / Assessment and Plan /  ED Course  I have reviewed the triage vital signs and the nursing  notes.  Pertinent labs & imaging results that were available during my care of the patient were reviewed by me and considered in my medical decision making (see chart for details).  CT scan shows only cholelithiasis without cholecystitis and a moderate-sized hiatal hernia, however no other acute finding. Her laboratory studies are reassuring. She will be discharged with primary doctor follow-up should her symptoms not improve.  Final Clinical Impressions(s) / ED Diagnoses   Final diagnoses:  None    New Prescriptions New Prescriptions   No medications on file  I personally performed the services described in this documentation, which was scribed in my presence. The recorded information has been reviewed and is accurate.        Veryl Speak, MD 10/22/16 [phone]

## 2016-10-22 NOTE — ED Notes (Signed)
Pt amb to [facility]

## 2016-10-22 NOTE — ED Triage Notes (Signed)
Pt c/o upper abd pain and bloating x 1 day , denies n/v/d

## 2016-10-22 NOTE — ED Notes (Signed)
No urine sample provide at this moment

## 2016-11-01 ENCOUNTER — Encounter: Payer: Self-pay | Admitting: Family Medicine

## 2016-11-01 ENCOUNTER — Ambulatory Visit (INDEPENDENT_AMBULATORY_CARE_PROVIDER_SITE_OTHER): Payer: Medicare Other | Admitting: Family Medicine

## 2016-11-01 VITALS — BP 128/70 | HR 60 | Temp 98.1°F | Ht 63.0 in | Wt 162.0 lb

## 2016-11-01 DIAGNOSIS — I10 Essential (primary) hypertension: Secondary | ICD-10-CM

## 2016-11-01 DIAGNOSIS — R1013 Epigastric pain: Secondary | ICD-10-CM

## 2016-11-01 DIAGNOSIS — K8020 Calculus of gallbladder without cholecystitis without obstruction: Secondary | ICD-10-CM

## 2016-11-01 DIAGNOSIS — E785 Hyperlipidemia, unspecified: Secondary | ICD-10-CM

## 2016-11-01 DIAGNOSIS — K219 Gastro-esophageal reflux disease without esophagitis: Secondary | ICD-10-CM | POA: Diagnosis not present

## 2016-11-01 DIAGNOSIS — I2510 Atherosclerotic heart disease of native coronary artery without angina pectoris: Secondary | ICD-10-CM

## 2016-11-01 MED ORDER — FAMCICLOVIR 500 MG PO TABS: 500.0000 mg | ORAL_TABLET | Freq: Three times a day (TID) | ORAL | 0 refills | Status: DC

## 2016-11-01 MED ORDER — HYOSCYAMINE SULFATE 0.125 MG SL SUBL: 0.1250 mg | SUBLINGUAL_TABLET | SUBLINGUAL | 0 refills | Status: DC | PRN

## 2016-11-01 NOTE — Progress Notes (Signed)
Pre visit review using our clinic review tool, if applicable. No additional management support is needed unless otherwise documented below in the visit note. 

## 2016-11-01 NOTE — Patient Instructions (Addendum)
r [id number] Shingles, which is also known as herpes zoster, is an infection that causes a painful skin rash and fluid-filled blisters. Shingles is not related to genital herpes, which is a sexually transmitted infection. Shingles only develops in people who:  Have had chickenpox.  Have received the chickenpox vaccine. (This is rare.) What are the causes? Shingles is caused by varicella-zoster virus (VZV). This is the same virus that causes chickenpox. After exposure to VZV, the virus stays in the body in an inactive (dormant) state. Shingles develops if the virus reactivates. This can happen many years after the initial exposure to VZV. It is not known what causes this virus to reactivate. What increases the risk? People who have had chickenpox or received the chickenpox vaccine are at risk for shingles. Infection is more common in people who:  Are older than age 76.  Have a weakened defense (immune) system, such as those with HIV, AIDS, or cancer.  Are taking medicines that weaken the immune system, such as transplant medicines.  Are under great stress. What are the signs or symptoms? Early symptoms of this condition include itching, tingling, and pain in an area on your skin. Pain may be described as burning, stabbing, or throbbing. A few days or weeks after symptoms start, a painful red rash appears, usually on one side of the body in a bandlike or beltlike pattern. The rash eventually turns into fluid-filled blisters that break open, scab over, and dry up in about 2-3 weeks. At any time during the infection, you may also develop:  A fever.  Chills.  A headache.  An upset stomach. How is this diagnosed? This condition is diagnosed with a skin exam. Sometimes, skin or fluid samples are taken from the blisters before a diagnosis is made. These samples are examined under a microscope or sent to a lab for testing. How is this treated? There is no specific cure for this  condition. Your health care provider will probably prescribe medicines to help you manage pain, recover more quickly, and avoid long-term problems. Medicines may include:  Antiviral drugs.  Anti-inflammatory drugs.  Pain medicines. If the area involved is on your face, you may be referred to a specialist, such as an eye doctor (ophthalmologist) or an ear, nose, and throat (ENT) doctor to help you avoid eye problems, chronic pain, or disability. Follow these instructions at home: Medicines   Take medicines only as directed by your health care provider.  Apply an anti-itch or numbing cream to the affected area as directed by your health care provider. Blister and Rash Care   Take a cool bath or apply cool compresses to the area of the rash or blisters as directed by your health care provider. This may help with pain and itching.  Keep your rash covered with a loose bandage (dressing). Wear loose-fitting clothing to help ease the pain of material rubbing against the rash.  Keep your rash and blisters clean with mild soap and cool water or as directed by your health care provider.  Check your rash every day for signs of infection. These include redness, swelling, and pain that lasts or increases.  Do not pick your blisters.  Do not scratch your rash. General instructions   Rest as directed by your health care provider.  Keep all follow-up visits as directed by your health care provider. This is important.  Until your blisters scab over, your infection can cause chickenpox in people who have never had it or been vaccinated  against it. To prevent this from happening, avoid contact with other people, especially:  Babies.  Pregnant women.  Children who have eczema.  Elderly people who have transplants.  People who have chronic illnesses, such as leukemia or AIDS. Contact a health care provider if:  Your pain is not relieved with prescribed medicines.  Your pain does not get  better after the rash heals.  Your rash looks infected. Signs of infection include redness, swelling, and pain that lasts or increases. Get help right away if:  The rash is on your face or nose.  You have facial pain, pain around your eye area, or loss of feeling on one side of your face.  You have ear pain or you have ringing in your ear.  You have loss of taste.  Your condition gets worse. This information is not intended to replace advice given to you by your health care provider. Make sure you discuss any questions you have with your health care provider. Document Released: 06/25/2005 Document Revised: 02/19/2016 Document Reviewed: 05/06/2014 Elsevier Interactive Patient Education  2017 [employer].

## 2016-11-01 NOTE — Progress Notes (Signed)
Patient ID: Heather [name], female   DOB: 1934-08-31, 82 y.o.   MRN: [id number]   Subjective:  I acted as a Education administrator for [name], [place], [place]   Patient ID: Heather [name], female    DOB: 04/02/1935, 82 y.o.   MRN: [id number]  Chief Complaint  Patient presents with  . Follow-up    Seen in the Emergency Department on 10/22/2016 for epigastric pain.    HPI  Patient is in today for a follow up in the Emergency Department on 10/22/2016; with a Dx of epigastric pain. Patient also states that she was told that she has a hernia. Patient has a Hx of HTN, GERD, Diabetes Mellitus, fatigue, anemia. Patient has no additional concerns noted at this time. She has not had a severe pain like the one that brought her to the ER since then. Her bowels are moving. No fevers or concerning signs of acute illness. She continues to struggle of the stress of caring for her ailing husband. Denies CP/palp/SOB/HA/congestion/fevers/GI c/o. Taking meds as prescribed   Patient Care Team: Mosie Lukes, MD as PCP - General (Family Medicine) Idelle Leech, OD (Optometry)   Past Medical History:  Diagnosis Date  . Angina   . Atrial fibrillation ([place])   . Borderline diabetes   . Coronary artery disease    post percutaneous coronary intervention in 2006  . Depression with anxiety 01/05/2012  . Dermatitis   . Diabetes mellitus without complication (HCC)    no meds  . Diarrhea 04/17/2015  . Diverticulitis    hx of  . DJD (degenerative joint disease) of knee 09/18/2011   knees- both, elbow- R, ankles   . Dysrhythmia, cardiac    "palpitations"  . Fatigue   . Gastric ulcer    hx of  . GERD (gastroesophageal reflux disease)    prilosec daily  . H/O dizziness    pre-syncope/due to dehydration  . H/O hiatal hernia   . Headache 04/17/2015  . Hip pain 08/28/2011  . Hx of total knee replacement 11/12/11   left knee  . Hyperlipidemia   . Hypertension    takes medications daily  . Medicare annual wellness visit,  subsequent 11/22/2013  . Occasional tremors   . Persistent disorder of initiating or maintaining sleep   . Restless legs    takes mirapex daily  . S/P inguinal herniorrhaphy   . Stroke [facility]) 2000   TIA- series that have resolved-  . UTI (lower urinary tract infection) 10/04/2013    Past Surgical History:  Procedure Laterality Date  . ABDOMINAL HYSTERECTOMY    . BRAIN SURGERY     related to stroke   2000  . CORONARY ANGIOPLASTY  03/2005; 06/2005   "1" plus "1"= "2 total"  . EYE SURGERY  11-16-13   cataract -right eye, left eye summer 2015  . FOOT SURGERY     left foot stress fracture repair  . HERNIA REPAIR     1989- esophageal hernia   . JOINT REPLACEMENT     left  . KNEE ARTHROSCOPY     2 on left and 1 on right  . TOTAL KNEE ARTHROPLASTY  11/12/2011   Procedure: TOTAL KNEE ARTHROPLASTY;  Surgeon: Lorn Junes, MD;  Location: [place];  Service: Orthopedics;  Laterality: Left;  Dr Noemi Chapel wants 90 minutes for this case  . TOTAL KNEE ARTHROPLASTY Right 11/29/2014   Procedure: RIGHT TOTAL KNEE ARTHROPLASTY;  Surgeon: Vickey Huger, MD;  Location: [place];  Service: Orthopedics;  Laterality: Right;  . UPPER GASTROINTESTINAL ENDOSCOPY  2013    Family History  Problem Relation Age of Onset  . Stroke Father     family hx of M 1st degree relative <50  . Coronary artery disease Mother   . Heart disease Mother   . Depression Brother   . Stroke Brother   . Diabetes Brother   . Cancer Brother     bladder with mets  . Diabetes Daughter     borderline  . Hypertension Daughter   . Arthritis      family hx of  . Hypertension      family hx of  . Other      family hx of cardiovascular disorder  . Thyroid disease Daughter   . Breast cancer Neg Hx   . Colon cancer Neg Hx   . Anesthesia problems Neg Hx   . Hypotension Neg Hx   . Malignant hyperthermia Neg Hx   . Pseudochol deficiency Neg Hx     Social History   Social History  . Marital status: Married    Spouse name: N/A  .  Number of children: 3  . Years of education: 12   Occupational History  . works partime in office Retired    retired   Social History Main Topics  . Smoking status: Never Smoker  . Smokeless tobacco: Never Used  . Alcohol use No  . Drug use: No  . Sexual activity: Yes    Birth control/ protection: Surgical     Comment: lives with husband, no dietary restrictions   Other Topics Concern  . Not on file   Social History Narrative   Lives with husband   Caffeine use- none    Outpatient Medications Prior to Visit  Medication Sig Dispense Refill  . amLODipine (NORVASC) 10 MG tablet TAKE 1/2 TABLET EVERY DAY 45 tablet 1  . aspirin EC 81 MG tablet Take 1 tablet (81 mg total) by mouth daily.    . Blood Glucose Monitoring Suppl (ONE TOUCH ULTRA SYSTEM KIT) W/DEVICE KIT 1 kit by Does not apply route once. DX: 250.00 Check sugars daily as needed 1 each 0  . cholecalciferol (VITAMIN D) 1000 UNITS tablet Take 1,000 Units by mouth daily.    . diclofenac sodium (VOLTAREN) 1 % GEL Apply 2 g topically 3 (three) times daily as needed for pain.    Marland Kitchen glucose blood (ONE TOUCH TEST STRIPS) test strip Check BS daily and prn DX: E11.9 100 each 11  . HYDROcodone-acetaminophen (NORCO/VICODIN) 5-325 MG tablet Take 1-2 tablets by mouth every 6 (six) hours as needed. 12 tablet 0  . isosorbide mononitrate (IMDUR) 60 MG 24 hr tablet TAKE 1/2 TABLET EVERY DAY 30 tablet 2  . losartan (COZAAR) 100 MG tablet TAKE 1 TABLET EVERY DAY 90 tablet 0  . magnesium oxide (MAG-OX) 400 MG tablet Take 400 mg by mouth 2 (two) times daily.     . metFORMIN (GLUCOPHAGE) 500 MG tablet TAKE 1 TABLET EVERY DAY WITH BREAKFAST 90 tablet 1  . metoprolol succinate (TOPROL-XL) 50 MG 24 hr tablet TAKE 1 TABLET TWICE DAILY IMMEDIATELY FOLLOWING A MEAL 180 tablet 2  . omeprazole (PRILOSEC) 20 MG capsule Take 20 mg by mouth 2 (two) times daily before a meal.    . ONETOUCH DELICA LANCETS FINE MISC Check sugars daily and prn  DX 250.00 100  each 1  . potassium chloride SA (K-DUR,KLOR-CON) 20 MEQ tablet TAKE 1 TABLET EVERY DAY  90 tablet 2  . pramipexole (MIRAPEX) 0.25 MG tablet TAKE 1 TABLET TWICE DAILY 180 tablet 1  . simvastatin (ZOCOR) 20 MG tablet TAKE 1 TABLET AT BEDTIME 90 tablet 2  . triamterene-hydrochlorothiazide (MAXZIDE-25) 37.5-25 MG tablet TAKE 1 TABLET EVERY DAY 90 tablet 1   No facility-administered medications prior to visit.     Allergies  Allergen Reactions  . Amoxicillin Other (See Comments)    Swelling of mouth  . Baclofen Other (See Comments)    Hyperactivity     Review of Systems  Constitutional: Negative for fever and malaise/fatigue.  HENT: Negative for congestion.   Eyes: Negative for blurred vision.  Respiratory: Negative for cough and shortness of breath.   Cardiovascular: Negative for chest pain, palpitations and leg swelling.  Gastrointestinal: Positive for abdominal pain and vomiting. Negative for constipation and diarrhea.  Musculoskeletal: Negative for back pain.  Skin: Negative for rash.  Neurological: Negative for loss of consciousness and headaches.       Objective:    Physical Exam  Constitutional: She is oriented to person, place, and time. She appears well-developed and well-nourished. No distress.  HENT:  Head: Normocephalic and atraumatic.  Eyes: Conjunctivae are normal.  Neck: Normal range of motion. No thyromegaly present.  Cardiovascular: Normal rate and regular rhythm.   Pulmonary/Chest: Effort normal and breath sounds normal. She has no wheezes.  Abdominal: Soft. Bowel sounds are normal. There is tenderness.  Mild tenderness with palpation over epigastrium and RUQ  Musculoskeletal: She exhibits no edema or deformity.  Neurological: She is alert and oriented to person, place, and time.  Skin: Skin is warm and dry. She is not diaphoretic.  Psychiatric: She has a normal mood and affect.    BP 128/70   Pulse 60   Temp 98.1 F (36.7 C) (Oral)   Ht '5\' 3"'  (1.6 m)    Wt 162 lb (73.5 kg)   SpO2 98% Comment: RA  BMI 28.70 kg/m  Wt Readings from Last 3 Encounters:  11/01/16 162 lb (73.5 kg)  10/22/16 160 lb (72.6 kg)  08/15/16 167 lb (75.8 kg)   BP Readings from Last 3 Encounters:  11/04/16 128/70  10/22/16 137/61  08/15/16 (!) 162/86     Immunization History  Administered Date(s) Administered  . Influenza Whole 06/08/2005, 06/29/2010  . Influenza, High Dose Seasonal PF 04/05/2015, 06/18/2016  . Influenza,inj,Quad PF,36+ Mos 04/30/2014  . Pneumococcal Conjugate-13 11/17/2013  . Pneumococcal Polysaccharide-23 07/09/2000  . Tdap 05/24/2015    Health Maintenance  Topic Date Due  . PNA vac Low Risk Adult (2 of 2 - PPSV23) 11/18/2014  . HEMOGLOBIN A1C  10/03/2015  . OPHTHALMOLOGY EXAM  08/28/2016  . INFLUENZA VACCINE  02/06/2017  . FOOT EXAM  06/18/2017  . TETANUS/TDAP  05/23/2025  . DEXA SCAN  Completed    Lab Results  Component Value Date   WBC 5.7 10/22/2016   HGB 12.5 10/22/2016   HCT 37.1 10/22/2016   PLT 153 10/22/2016   GLUCOSE 120 (H) 10/22/2016   CHOL 151 04/05/2015   TRIG 153.0 (H) 04/05/2015   HDL 59.90 04/05/2015   LDLDIRECT 73.4 05/20/2014   LDLCALC 60 04/05/2015   ALT 14 10/22/2016   AST 21 10/22/2016   NA 138 10/22/2016   K 4.3 10/22/2016   CL 104 10/22/2016   CREATININE 1.24 (H) 10/22/2016   BUN 28 (H) 10/22/2016   CO2 25 10/22/2016   TSH 1.29 04/05/2015   INR 0.98 11/16/2014   HGBA1C 6.8 (H) 04/05/2015  Lab Results  Component Value Date   TSH 1.29 04/05/2015   Lab Results  Component Value Date   WBC 5.7 10/22/2016   HGB 12.5 10/22/2016   HCT 37.1 10/22/2016   MCV 91.2 10/22/2016   PLT 153 10/22/2016   Lab Results  Component Value Date   NA 138 10/22/2016   K 4.3 10/22/2016   CO2 25 10/22/2016   GLUCOSE 120 (H) 10/22/2016   BUN 28 (H) 10/22/2016   CREATININE 1.24 (H) 10/22/2016   BILITOT 0.8 10/22/2016   ALKPHOS 79 10/22/2016   AST 21 10/22/2016   ALT 14 10/22/2016   PROT 6.7  10/22/2016   ALBUMIN 4.3 10/22/2016   CALCIUM 9.4 10/22/2016   ANIONGAP 9 10/22/2016   GFR 69.61 04/05/2015   Lab Results  Component Value Date   CHOL 151 04/05/2015   Lab Results  Component Value Date   HDL 59.90 04/05/2015   Lab Results  Component Value Date   LDLCALC 60 04/05/2015   Lab Results  Component Value Date   TRIG 153.0 (H) 04/05/2015   Lab Results  Component Value Date   CHOLHDL 3 04/05/2015   Lab Results  Component Value Date   HGBA1C 6.8 (H) 04/05/2015         Assessment & Plan:   Problem List Items Addressed This Visit    Hyperlipidemia    Tolerating statin, encouraged heart healthy diet, avoid trans fats, minimize simple carbs and saturated fats. Increase exercise as tolerated      Essential hypertension    Well controlled, no changes to meds. Encouraged heart healthy diet such as the DASH diet and exercise as tolerated.       GERD    Avoid offending foods, start probiotics. Do not eat large meals in late evening and consider raising head of bed. Referred to gastroenterology      Relevant Medications   hyoscyamine (LEVSIN SL) 0.125 MG SL tablet   Epigastric pain    Seen in ER 4/16 with severe pain in epigastrium after bending over and standing back up. Pain resolved eventually and has not recurred to the same extent. Will have much milder pains throughout day. No obvious association with eating or type of food. Suspect possible hernia. Encouraged PPI and H2 blocker daily and given rx for Hyoscyamine to use prn       Other Visit Diagnoses    Acute epigastric pain    -  Primary   Relevant Orders   Ambulatory referral to Gastroenterology   Gallstones       Relevant Orders   Ambulatory referral to Gastroenterology      I am having Ms. Miers start on hyoscyamine and famciclovir. I am also having her maintain her magnesium oxide, ONE TOUCH ULTRA SYSTEM KIT, ONETOUCH DELICA LANCETS FINE, omeprazole, glucose blood, cholecalciferol, aspirin  EC, diclofenac sodium, pramipexole, metoprolol succinate, simvastatin, triamterene-hydrochlorothiazide, isosorbide mononitrate, potassium chloride SA, metFORMIN, amLODipine, losartan, and HYDROcodone-acetaminophen.  Meds ordered this encounter  Medications  . hyoscyamine (LEVSIN SL) 0.125 MG SL tablet    Sig: Place 1 tablet (0.125 mg total) under the tongue every 4 (four) hours as needed.    Dispense:  30 tablet    Refill:  0  . famciclovir (FAMVIR) 500 MG tablet    Sig: Take 1 tablet (500 mg total) by mouth 3 (three) times daily.    Dispense:  21 tablet    Refill:  0    CMA served as scribe during this visit. History,  Physical and Plan performed by medical provider. Documentation and orders reviewed and attested to.  [name], MD

## 2016-11-04 NOTE — Assessment & Plan Note (Signed)
Well controlled, no changes to meds. Encouraged heart healthy diet such as the DASH diet and exercise as tolerated.  °

## 2016-11-04 NOTE — Assessment & Plan Note (Signed)
Avoid offending foods, start probiotics. Do not eat large meals in late evening and consider raising head of bed. Referred to gastroenterology

## 2016-11-04 NOTE — Assessment & Plan Note (Signed)
Seen in ER 4/16 with severe pain in epigastrium after bending over and standing back up. Pain resolved eventually and has not recurred to the same extent. Will have much milder pains throughout day. No obvious association with eating or type of food. Suspect possible hernia. Encouraged PPI and H2 blocker daily and given rx for Hyoscyamine to use prn

## 2016-11-04 NOTE — Assessment & Plan Note (Signed)
Tolerating statin, encouraged heart healthy diet, avoid trans fats, minimize simple carbs and saturated fats. Increase exercise as tolerated 

## 2016-12-13 ENCOUNTER — Encounter: Payer: Self-pay | Admitting: Family Medicine

## 2016-12-14 ENCOUNTER — Other Ambulatory Visit: Payer: Self-pay | Admitting: Family Medicine

## 2017-01-07 DIAGNOSIS — J019 Acute sinusitis, unspecified: Secondary | ICD-10-CM | POA: Diagnosis not present

## 2017-01-07 DIAGNOSIS — J00 Acute nasopharyngitis [common cold]: Secondary | ICD-10-CM | POA: Diagnosis not present

## 2017-01-14 DIAGNOSIS — J209 Acute bronchitis, unspecified: Secondary | ICD-10-CM | POA: Diagnosis not present

## 2017-01-17 DIAGNOSIS — J189 Pneumonia, unspecified organism: Secondary | ICD-10-CM | POA: Diagnosis not present

## 2017-01-24 DIAGNOSIS — N390 Urinary tract infection, site not specified: Secondary | ICD-10-CM | POA: Diagnosis not present

## 2017-01-24 DIAGNOSIS — L259 Unspecified contact dermatitis, unspecified cause: Secondary | ICD-10-CM | POA: Diagnosis not present

## 2017-01-28 ENCOUNTER — Encounter: Payer: Self-pay | Admitting: Gastroenterology

## 2017-01-28 ENCOUNTER — Ambulatory Visit (INDEPENDENT_AMBULATORY_CARE_PROVIDER_SITE_OTHER): Payer: Medicare Other | Admitting: Family

## 2017-01-28 ENCOUNTER — Ambulatory Visit (INDEPENDENT_AMBULATORY_CARE_PROVIDER_SITE_OTHER): Payer: Medicare Other | Admitting: Gastroenterology

## 2017-01-28 VITALS — BP 140/72 | HR 64 | Ht 63.0 in | Wt 162.1 lb

## 2017-01-28 VITALS — BP 156/59 | HR 63 | Temp 98.1°F | Resp 18 | Wt 162.2 lb

## 2017-01-28 DIAGNOSIS — I2510 Atherosclerotic heart disease of native coronary artery without angina pectoris: Secondary | ICD-10-CM

## 2017-01-28 DIAGNOSIS — R1011 Right upper quadrant pain: Secondary | ICD-10-CM | POA: Diagnosis not present

## 2017-01-28 DIAGNOSIS — K219 Gastro-esophageal reflux disease without esophagitis: Secondary | ICD-10-CM | POA: Diagnosis not present

## 2017-01-28 DIAGNOSIS — L259 Unspecified contact dermatitis, unspecified cause: Secondary | ICD-10-CM

## 2017-01-28 DIAGNOSIS — R1310 Dysphagia, unspecified: Secondary | ICD-10-CM | POA: Diagnosis not present

## 2017-01-28 DIAGNOSIS — K8020 Calculus of gallbladder without cholecystitis without obstruction: Secondary | ICD-10-CM

## 2017-01-28 DIAGNOSIS — R21 Rash and other nonspecific skin eruption: Secondary | ICD-10-CM

## 2017-01-28 MED ORDER — BETAMETHASONE DIPROPIONATE 0.05 % EX CREA: TOPICAL_CREAM | Freq: Two times a day (BID) | CUTANEOUS | 0 refills | Status: DC | PRN

## 2017-01-28 NOTE — Patient Instructions (Signed)
Your symptoms most consistent with a resolving poison ivy. You may continue to apply a steroid cream twice daily. Call if symptoms worsen or if they are not improved in 1 week.

## 2017-01-28 NOTE — Progress Notes (Signed)
Subjective:    Patient ID: Heather [name], female    DOB: 06/05/1935, 82 y.o.   MRN: [id number]  HPI   Heather [name] is an 82 yr old female who presents today with chief complaint of rash.  She reports that rash began approximately 1 week ago on her left arm.  Initially it was extremely pruritic. She initially tried triamcinolone cream which did not help. She did have some improvement in her symptoms with the application of Vicks VapoRub to the affected area on her arm. More recently she started to notice some rash on her left lateral abdomen this is mildly itchy but not as bad as the left arm. She reports that the left arm is improved.    Review of Systems See HPI  Past Medical History:  Diagnosis Date  . Angina   . Atrial fibrillation ([place])   . Borderline diabetes   . Coronary artery disease    post percutaneous coronary intervention in 2006  . Depression with anxiety 01/05/2012  . Dermatitis   . Diabetes mellitus without complication (HCC)    no meds  . Diarrhea 04/17/2015  . Diverticulitis    hx of  . DJD (degenerative joint disease) of knee 09/18/2011   knees- both, elbow- R, ankles   . Dysrhythmia, cardiac    "palpitations"  . Fatigue   . Gastric ulcer    hx of  . GERD (gastroesophageal reflux disease)    prilosec daily  . H/O dizziness    pre-syncope/due to dehydration  . H/O hiatal hernia   . Headache 04/17/2015  . Hip pain 08/28/2011  . Hx of total knee replacement 11/12/11   left knee  . Hyperlipidemia   . Hypertension    takes medications daily  . Medicare annual wellness visit, subsequent 11/22/2013  . Occasional tremors   . Persistent disorder of initiating or maintaining sleep   . Restless legs    takes mirapex daily  . S/P inguinal herniorrhaphy   . Stroke [facility]) 2000   TIA- series that have resolved-  . UTI (lower urinary tract infection) 10/04/2013     Social History   Social History  . Marital status: Married    Spouse name: N/A  . Number of  children: 3  . Years of education: 12   Occupational History  . works partime in office Retired    retired   Social History Main Topics  . Smoking status: Never Smoker  . Smokeless tobacco: Never Used  . Alcohol use No  . Drug use: No  . Sexual activity: Yes    Birth control/ protection: Surgical     Comment: lives with husband, no dietary restrictions   Other Topics Concern  . Not on file   Social History Narrative   Lives with husband   Caffeine use- none    Past Surgical History:  Procedure Laterality Date  . ABDOMINAL HYSTERECTOMY    . BRAIN SURGERY     related to stroke   2000  . CORONARY ANGIOPLASTY  03/2005; 06/2005   "1" plus "1"= "2 total"  . EYE SURGERY  11-16-13   cataract -right eye, left eye summer 2015  . FOOT SURGERY     left foot stress fracture repair  . HERNIA REPAIR     1989- esophageal hernia   . JOINT REPLACEMENT     left  . KNEE ARTHROSCOPY     2 on left and 1 on right  . TOTAL KNEE ARTHROPLASTY  11/12/2011   Procedure: TOTAL KNEE ARTHROPLASTY;  Surgeon: Lorn Junes, MD;  Location: [place];  Service: Orthopedics;  Laterality: Left;  Dr Noemi Chapel wants 90 minutes for this case  . TOTAL KNEE ARTHROPLASTY Right 11/29/2014   Procedure: RIGHT TOTAL KNEE ARTHROPLASTY;  Surgeon: Vickey Huger, MD;  Location: [place];  Service: Orthopedics;  Laterality: Right;  . UPPER GASTROINTESTINAL ENDOSCOPY  2013    Family History  Problem Relation Age of Onset  . Stroke Father        family hx of M 1st degree relative <50  . Coronary artery disease Mother   . Heart disease Mother   . Depression Brother   . Stroke Brother   . Diabetes Brother   . Cancer Brother        bladder with mets  . Diabetes Daughter        borderline  . Hypertension Daughter   . Arthritis Unknown        family hx of  . Hypertension Unknown        family hx of  . Other Unknown        family hx of cardiovascular disorder  . Thyroid disease Daughter   . Breast cancer Neg Hx   . Colon  cancer Neg Hx   . Anesthesia problems Neg Hx   . Hypotension Neg Hx   . Malignant hyperthermia Neg Hx   . Pseudochol deficiency Neg Hx     Allergies  Allergen Reactions  . Amoxicillin Other (See Comments)    Swelling of mouth  . Baclofen Other (See Comments)    Hyperactivity     Current Outpatient Prescriptions on File Prior to Visit  Medication Sig Dispense Refill  . amLODipine (NORVASC) 10 MG tablet TAKE 1/2 TABLET EVERY DAY 45 tablet 1  . aspirin EC 81 MG tablet Take 1 tablet (81 mg total) by mouth daily.    . Blood Glucose Monitoring Suppl (ONE TOUCH ULTRA SYSTEM KIT) W/DEVICE KIT 1 kit by Does not apply route once. DX: 250.00 Check sugars daily as needed 1 each 0  . cholecalciferol (VITAMIN D) 1000 UNITS tablet Take 1,000 Units by mouth daily.    . diclofenac sodium (VOLTAREN) 1 % GEL Apply 2 g topically 3 (three) times daily as needed for pain.    . famciclovir (FAMVIR) 500 MG tablet Take 1 tablet (500 mg total) by mouth 3 (three) times daily. 21 tablet 0  . glucose blood (ONE TOUCH TEST STRIPS) test strip Check BS daily and prn DX: E11.9 100 each 11  . hyoscyamine (LEVSIN SL) 0.125 MG SL tablet Place 1 tablet (0.125 mg total) under the tongue every 4 (four) hours as needed. 30 tablet 0  . isosorbide mononitrate (IMDUR) 60 MG 24 hr tablet TAKE 1/2 TABLET EVERY DAY 30 tablet 2  . losartan (COZAAR) 100 MG tablet TAKE 1 TABLET EVERY DAY 90 tablet 0  . magnesium oxide (MAG-OX) 400 MG tablet Take 400 mg by mouth 2 (two) times daily.     . metFORMIN (GLUCOPHAGE) 500 MG tablet TAKE 1 TABLET EVERY DAY WITH BREAKFAST 90 tablet 1  . metoprolol succinate (TOPROL-XL) 50 MG 24 hr tablet TAKE 1 TABLET TWICE DAILY IMMEDIATELY FOLLOWING A MEAL 180 tablet 2  . omeprazole (PRILOSEC) 20 MG capsule Take 20 mg by mouth 2 (two) times daily before a meal.    . ONETOUCH DELICA LANCETS FINE MISC Check sugars daily and prn  DX 250.00 100 each 1  . potassium  chloride SA (K-DUR,KLOR-CON) 20 MEQ tablet  TAKE 1 TABLET EVERY DAY 90 tablet 2  . pramipexole (MIRAPEX) 0.25 MG tablet TAKE 1 TABLET TWICE DAILY 180 tablet 1  . simvastatin (ZOCOR) 20 MG tablet TAKE 1 TABLET AT BEDTIME 90 tablet 2  . triamterene-hydrochlorothiazide (MAXZIDE-25) 37.5-25 MG tablet TAKE 1 TABLET EVERY DAY 90 tablet 1  . [DISCONTINUED] nitroGLYCERIN (NITROSTAT) 0.4 MG SL tablet Place 0.4 mg under the tongue every 5 (five) minutes as needed.       No current facility-administered medications on file prior to visit.     BP (!) 156/59 (BP Location: Left Arm, Patient Position: Sitting, Cuff Size: Normal)   Pulse 63   Temp 98.1 F (36.7 C) (Oral)   Resp 18   Wt 162 lb 3.2 oz (73.6 kg)   SpO2 97%   BMI 28.73 kg/m       Objective:   Physical Exam  Constitutional: She is oriented to person, place, and time. She appears well-developed and well-nourished.  Cardiovascular: Normal heart sounds.   No murmur heard. Neurological: She is alert and oriented to person, place, and time.  Skin: Skin is warm and dry.  Maculopapular rash noted on left forearm and left upper inner arm.  Similar rash noted on left lateral abdomen  Psychiatric: She has a normal mood and affect. Her behavior is normal. Judgment and thought content normal.             Assessment & Plan:  Contact dermatitis-this appears to be resolving. I suspect that this is poison ivy. I have advised patient to continue to apply steroid cream twice daily to affected areas as needed. A prescription was provided for betamethasone. She is advised to let us know if symptoms worsen or if they do not continue to improve. Patient verbalizes understanding.

## 2017-01-28 NOTE — Patient Instructions (Signed)
If you are age 80 or older, your body mass index should be between 23-30. Your Body mass index is 28.72 kg/m. If this is out of the aforementioned range listed, please consider follow up with your Primary Care Provider.  If you are age 16 or younger, your body mass index should be between 19-25. Your Body mass index is 28.72 kg/m. If this is out of the aformentioned range listed, please consider follow up with your Primary Care Provider.   We will refer you to see Dr. Serita Grammes at [facility] for possible gallbladder removal. You will be contacted by their office with an appointment.  We will hold off on scheduling an upper endoscopy at this time. However, if symptoms persist please call our office.  Please follow up with your PCP for your rash.  Thank you.

## 2017-01-28 NOTE — Progress Notes (Signed)
HPI :  82 y/o female with a history of CAD, TIA / CVA, DM, reported history of PUD, here for a new patient visit. She previously has followed with Dr. Earlean Shawl for her GI care. Main complaint today is epigastric / RUQ pain.   Epigastric pain / RUQ pain - she has had some intermittent upper abdominal pain ongoing for years, but worsening / new changes recently. Pain comes and goes. Eating she thinks can precipitate it, other times it comes on without precipitants. She thinks greasy foods can precipitate pain, feels it in RUQ. She does have heartburn at baseline if she doesn't take anything, she thinks her medication, omeprazole 42m twice daily - she thinks it works well to control her heartburn. She sleeps sitting up at times due to nocturnal symptoms. She had a reflux surgery in 1988 - she had paraesophageal hernia repaired along with this. She does have some dysphagia with eating which is chronic - she had a dilation in 2011 which she thinks helped. She reports her dysphagia is stable. She has some rare dysphagia to liquids as well. Her pain otherwise lasts a fairly short amount of time when she gets it, minutes or so. She feels it roughly variablly - sometimes daily, sometimes may go for a period of time without it.  Pain can be in the RUQ, does not radiate to her back at all. She takes aspirin 875m but no other NSAID.  Patient otherwise asks my opinion regarding an erythematous rash on left arm and left abdomen, treating with topical steroids. Pruritic.  Colonoscopy 06/2011 - Dr. MeEarlean Shawlno report available today, she thinks it was normal EGD 05/2010 - Dr. MeEarlean Shawldistal esophageal narrowing s/p dilation to 1958mhiatal hernia, inflamed gastric polyps, gastritis - HP negative  CT scan abdomen / pelvis 10/22/2016 - cholelithiasis, moderate hiatal hernia, diverticulosis, small epigastric ventral hernia and umbilical hernia  Past Medical History:  Diagnosis Date  . Angina   . [facility]  [facility] Borderline diabetes   . Coronary artery disease    post percutaneous coronary intervention in 2006  . Depression with anxiety 01/05/2012  . Dermatitis   . Diabetes mellitus without complication (HCC)    no meds  . Diarrhea 04/17/2015  . Diverticulitis    hx of  . DJD (degenerative joint disease) of knee 09/18/2011   knees- both, elbow- R, ankles   . Dysrhythmia, cardiac    "palpitations"  . Fatigue   . Gastric ulcer    hx of  . GERD (gastroesophageal reflux disease)    prilosec daily  . H/O dizziness    pre-syncope/due to dehydration  . H/O hiatal hernia   . Headache 04/17/2015  . Hip pain 08/28/2011  . Hx of total knee replacement 11/12/11   left knee  . Hyperlipidemia   . Hypertension    takes medications daily  . Medicare annual wellness visit, subsequent 11/22/2013  . Occasional tremors   . Persistent disorder of initiating or maintaining sleep   . Restless legs    takes mirapex daily  . S/P inguinal herniorrhaphy   . [facility]   TIA- series that have resolved-  . UTI (lower urinary tract infection) 10/04/2013     Past Surgical History:  Procedure Laterality Date  . ABDOMINAL HYSTERECTOMY    . BRAIN SURGERY     related to stroke   2000  . CORONARY ANGIOPLASTY  03/2005; 06/2005   "1" plus "1"= "2 total"  .  EYE SURGERY  11-16-13   cataract -right eye, left eye summer 2015  . FOOT SURGERY     left foot stress fracture repair  . HERNIA REPAIR     1989- esophageal hernia   . JOINT REPLACEMENT     left  . KNEE ARTHROSCOPY     2 on left and 1 on right  . TOTAL KNEE ARTHROPLASTY  11/12/2011   Procedure: TOTAL KNEE ARTHROPLASTY;  Surgeon: Lorn Junes, MD;  Location: [place];  Service: Orthopedics;  Laterality: Left;  Dr Noemi Chapel wants 90 minutes for this case  . TOTAL KNEE ARTHROPLASTY Right 11/29/2014   Procedure: RIGHT TOTAL KNEE ARTHROPLASTY;  Surgeon: Vickey Huger, MD;  Location: [place];  Service: Orthopedics;  Laterality: Right;  . UPPER  GASTROINTESTINAL ENDOSCOPY  2013   Family History  Problem Relation Age of Onset  . Stroke Father        family hx of M 1st degree relative <50  . Coronary artery disease Mother   . Heart disease Mother   . Depression Brother   . Stroke Brother   . Diabetes Brother   . Cancer Brother        bladder with mets  . Diabetes Daughter        borderline  . Hypertension Daughter   . Arthritis Unknown        family hx of  . Hypertension Unknown        family hx of  . Other Unknown        family hx of cardiovascular disorder  . Thyroid disease Daughter   . Breast cancer Neg Hx   . Colon cancer Neg Hx   . Anesthesia problems Neg Hx   . Hypotension Neg Hx   . Malignant hyperthermia Neg Hx   . Pseudochol deficiency Neg Hx    Social History  Substance Use Topics  . Smoking status: Never Smoker  . Smokeless tobacco: Never Used  . Alcohol use No   Current Outpatient Prescriptions  Medication Sig Dispense Refill  . amLODipine (NORVASC) 10 MG tablet TAKE 1/2 TABLET EVERY DAY 45 tablet 1  . aspirin EC 81 MG tablet Take 1 tablet (81 mg total) by mouth daily.    . Blood Glucose Monitoring Suppl (ONE TOUCH ULTRA SYSTEM KIT) W/DEVICE KIT 1 kit by Does not apply route once. DX: 250.00 Check sugars daily as needed 1 each 0  . cholecalciferol (VITAMIN D) 1000 UNITS tablet Take 1,000 Units by mouth daily.    . diclofenac sodium (VOLTAREN) 1 % GEL Apply 2 g topically 3 (three) times daily as needed for pain.    . famciclovir (FAMVIR) 500 MG tablet Take 1 tablet (500 mg total) by mouth 3 (three) times daily. 21 tablet 0  . glucose blood (ONE TOUCH TEST STRIPS) test strip Check BS daily and prn DX: E11.9 100 each 11  . HYDROcodone-acetaminophen (NORCO/VICODIN) 5-325 MG tablet Take 1-2 tablets by mouth every 6 (six) hours as needed. 12 tablet 0  . hyoscyamine (LEVSIN SL) 0.125 MG SL tablet Place 1 tablet (0.125 mg total) under the tongue every 4 (four) hours as needed. 30 tablet 0  . isosorbide  mononitrate (IMDUR) 60 MG 24 hr tablet TAKE 1/2 TABLET EVERY DAY 30 tablet 2  . losartan (COZAAR) 100 MG tablet TAKE 1 TABLET EVERY DAY 90 tablet 0  . magnesium oxide (MAG-OX) 400 MG tablet Take 400 mg by mouth 2 (two) times daily.     . metFORMIN (  GLUCOPHAGE) 500 MG tablet TAKE 1 TABLET EVERY DAY WITH BREAKFAST 90 tablet 1  . metoprolol succinate (TOPROL-XL) 50 MG 24 hr tablet TAKE 1 TABLET TWICE DAILY IMMEDIATELY FOLLOWING A MEAL 180 tablet 2  . omeprazole (PRILOSEC) 20 MG capsule Take 20 mg by mouth 2 (two) times daily before a meal.    . ONETOUCH DELICA LANCETS FINE MISC Check sugars daily and prn  DX 250.00 100 each 1  . potassium chloride SA (K-DUR,KLOR-CON) 20 MEQ tablet TAKE 1 TABLET EVERY DAY 90 tablet 2  . pramipexole (MIRAPEX) 0.25 MG tablet TAKE 1 TABLET TWICE DAILY 180 tablet 1  . simvastatin (ZOCOR) 20 MG tablet TAKE 1 TABLET AT BEDTIME 90 tablet 2  . triamterene-hydrochlorothiazide (MAXZIDE-25) 37.5-25 MG tablet TAKE 1 TABLET EVERY DAY 90 tablet 1   No current facility-administered medications for this visit.    Allergies  Allergen Reactions  . Amoxicillin Other (See Comments)    Swelling of mouth  . Baclofen Other (See Comments)    Hyperactivity      Review of Systems: All systems reviewed and negative except where noted in HPI.   Lab Results  Component Value Date   WBC 5.7 10/22/2016   HGB 12.5 10/22/2016   HCT 37.1 10/22/2016   MCV 91.2 10/22/2016   PLT 153 10/22/2016    Lab Results  Component Value Date   CREATININE 1.24 (H) 10/22/2016   BUN 28 (H) 10/22/2016   NA 138 10/22/2016   K 4.3 10/22/2016   CL 104 10/22/2016   CO2 25 10/22/2016    Lab Results  Component Value Date   ALT 14 10/22/2016   AST 21 10/22/2016   ALKPHOS 79 10/22/2016   BILITOT 0.8 10/22/2016     Physical Exam: BP 140/72   Pulse 64   Ht _0  (1.6 m)   Wt 162 lb 2 oz (73.5 kg)   BMI 28.72 kg/m  Constitutional: Pleasant,well-developed, female in no acute  distress. HEENT: Normocephalic and atraumatic. Conjunctivae are normal. No scleral icterus. Neck supple.  Cardiovascular: Normal rate, regular rhythm.  Pulmonary/chest: Effort normal and breath sounds normal. No wheezing, rales or rhonchi. Abdominal: Soft, nondistended, mild RUQ to epigastric TTP, no rebound or guarding.  There are no masses palpable.  Extremities: no edema Lymphadenopathy: No cervical adenopathy noted. Neurological: Alert and oriented to person place and time. Skin: erythematous dermatitis type rash on left arm and left abdomen Psychiatric: Normal mood and affect. Behavior is normal.   ASSESSMENT AND PLAN: 82 y/o female with history as outlined above, here for new patient assessment of the following:  Right upper quadrant / epigastric pain / gallstones - it appears she's had some intermittent symptoms long-standing, now worsening over time. She localizes the pain is mostly right upper quadrant and came be preceded by eating greasy fatty foods. Symptoms sound most consistent with biliary colic, she has gallstones noted on her recent CT scan. I discussed this with her as well as other potential etiologies to include peptic ulcer disease, H. pylori etc. however she's had a endoscopy previously which did not show any concerning pathology and she's been maintained on proton pump inhibitor, making peptic ulcer disease less likely. I offered her a surgical evaluation to consider cholecystectomy given her progressive symptoms, will refer to Dr. Donne Hazel. If endoscopy is needed to rule out upper tract pathology prior to surgery we can do that, patient declined at this time. We'll await consultation.  GERD / Dysphagia - long-standing symptoms status post surgery,  on chronic PPI with good control of reflux symptoms at this time. She does appear to have recurrence of hiatal hernia. She has no history of Barrett's esophagus. She does have chronic dysphagia following her surgery and has had  benefit with dilation in the past. I offered her an upper endoscopy with dilation for dysphagia. After discussion of this she wants to hold off given the symptom appears chronic and not bothering her too much right now. I advised her that if this symptom worsens or bothering her she can let me know and we'll proceed with endoscopy. She agreed  Dermatitis - appears have dermatitis on left arm and left abdomen, recommend she follow up with primary care of persistent despite topical steroids. She agreed   Cellar, MD [facility] Pager [phone]  CC: Mosie Lukes, MD

## 2017-01-30 ENCOUNTER — Telehealth: Payer: Self-pay

## 2017-01-30 NOTE — Telephone Encounter (Signed)
Pt has an appt schedule with Dr. Donne Hazel 03/07/17 at 9:50. She was made aware by [facility] of appt date/time

## 2017-02-07 DIAGNOSIS — H43813 Vitreous degeneration, bilateral: Secondary | ICD-10-CM | POA: Diagnosis not present

## 2017-02-07 DIAGNOSIS — H52223 Regular astigmatism, bilateral: Secondary | ICD-10-CM | POA: Diagnosis not present

## 2017-02-07 DIAGNOSIS — I10 Essential (primary) hypertension: Secondary | ICD-10-CM | POA: Diagnosis not present

## 2017-02-07 DIAGNOSIS — H5203 Hypermetropia, bilateral: Secondary | ICD-10-CM | POA: Diagnosis not present

## 2017-02-07 DIAGNOSIS — H524 Presbyopia: Secondary | ICD-10-CM | POA: Diagnosis not present

## 2017-02-07 DIAGNOSIS — Z961 Presence of intraocular lens: Secondary | ICD-10-CM | POA: Diagnosis not present

## 2017-02-07 DIAGNOSIS — H35033 Hypertensive retinopathy, bilateral: Secondary | ICD-10-CM | POA: Diagnosis not present

## 2017-02-07 DIAGNOSIS — Z9849 Cataract extraction status, unspecified eye: Secondary | ICD-10-CM | POA: Diagnosis not present

## 2017-04-01 ENCOUNTER — Other Ambulatory Visit: Payer: Self-pay | Admitting: Family Medicine

## 2017-04-01 ENCOUNTER — Other Ambulatory Visit: Payer: Self-pay | Admitting: Cardiovascular Disease

## 2017-04-01 NOTE — Telephone Encounter (Signed)
SB-She was last seen in April/when would you like her to F/U? Received refill requests/plz advise/thx dmf

## 2017-04-01 NOTE — Telephone Encounter (Signed)
I allowed a 90 day refill on all meds but ideally she would be seen before these run out

## 2017-04-02 NOTE — Telephone Encounter (Signed)
Pt is now scheduled for 11.19.18 @ 9:15am/thx dmf

## 2017-04-15 DIAGNOSIS — K8020 Calculus of gallbladder without cholecystitis without obstruction: Secondary | ICD-10-CM | POA: Diagnosis not present

## 2017-04-18 ENCOUNTER — Ambulatory Visit (AMBULATORY_SURGERY_CENTER): Payer: Self-pay | Admitting: *Deleted

## 2017-04-18 VITALS — Ht 63.0 in | Wt 164.2 lb

## 2017-04-18 DIAGNOSIS — R1013 Epigastric pain: Secondary | ICD-10-CM

## 2017-04-18 NOTE — Progress Notes (Signed)
No egg or soy allergy known to patient  No issues with past sedation with any surgeries  or procedures, no intubation problems  No diet pills per patient No home 02 use per patient  No blood thinners per patient  Pt denies issues with constipation  No A fib or A flutter  Yes not on anticoag. EMMI video sent to pt's e mail declined

## 2017-04-25 ENCOUNTER — Ambulatory Visit (AMBULATORY_SURGERY_CENTER): Payer: Medicare Other | Admitting: Gastroenterology

## 2017-04-25 ENCOUNTER — Encounter: Payer: Self-pay | Admitting: Gastroenterology

## 2017-04-25 VITALS — BP 159/66 | HR 52 | Temp 96.9°F | Resp 14 | Ht 63.0 in | Wt 162.0 lb

## 2017-04-25 DIAGNOSIS — K3189 Other diseases of stomach and duodenum: Secondary | ICD-10-CM | POA: Diagnosis not present

## 2017-04-25 DIAGNOSIS — R1013 Epigastric pain: Secondary | ICD-10-CM

## 2017-04-25 DIAGNOSIS — Z13810 Encounter for screening for upper gastrointestinal disorder: Secondary | ICD-10-CM | POA: Diagnosis not present

## 2017-04-25 DIAGNOSIS — D131 Benign neoplasm of stomach: Secondary | ICD-10-CM

## 2017-04-25 DIAGNOSIS — R10821 Right upper quadrant rebound abdominal tenderness: Secondary | ICD-10-CM

## 2017-04-25 DIAGNOSIS — E119 Type 2 diabetes mellitus without complications: Secondary | ICD-10-CM | POA: Diagnosis not present

## 2017-04-25 DIAGNOSIS — I10 Essential (primary) hypertension: Secondary | ICD-10-CM | POA: Diagnosis not present

## 2017-04-25 DIAGNOSIS — K317 Polyp of stomach and duodenum: Secondary | ICD-10-CM | POA: Diagnosis not present

## 2017-04-25 DIAGNOSIS — I2510 Atherosclerotic heart disease of native coronary artery without angina pectoris: Secondary | ICD-10-CM | POA: Diagnosis not present

## 2017-04-25 MED ORDER — SODIUM CHLORIDE 0.9 % IV SOLN: 500.0000 mL | INTRAVENOUS | Status: DC

## 2017-04-25 NOTE — Op Note (Signed)
[facility] Patient Name: Heather [name] Procedure Date: 04/25/2017 10:23 AM MRN: [id number] Endoscopist: Remo Lipps P. Armbruster MD, MD Age: 82 Referring MD:  Date of Birth: 1935/06/30 Gender: Female Account #: [id number] Procedure:                Upper GI endoscopy Indications:              Abdominal pain in the right upper quadrant, Upper                            abdominal pain, rare dysphagia Medicines:                Monitored Anesthesia Care Procedure:                Pre-Anesthesia Assessment:                           - Prior to the procedure, a History and Physical                            was performed, and patient medications and                            allergies were reviewed. The patient's tolerance of                            previous anesthesia was also reviewed. The risks                            and benefits of the procedure and the sedation                            options and risks were discussed with the patient.                            All questions were answered, and informed consent                            was obtained. Prior Anticoagulants: The patient has                            taken no previous anticoagulant or antiplatelet                            agents. ASA Grade Assessment: II - A patient with                            mild systemic disease. After reviewing the risks                            and benefits, the patient was deemed in                            satisfactory condition to undergo the procedure.  After obtaining informed consent, the endoscope was                            passed under direct vision. Throughout the                            procedure, the patient's blood pressure, pulse, and                            oxygen saturations were monitored continuously. The                            Model GIF-[id number]) scope was introduced                            through the  mouth, and advanced to the second part                            of duodenum. The upper GI endoscopy was                            accomplished without difficulty. The patient                            tolerated the procedure well. Scope In: Scope Out: Findings:                 Esophagogastric landmarks were identified: the                            Z-line was found at 31 cm, the gastroesophageal                            junction was found at 31 cm and the upper extent of                            the gastric folds was found at 35 cm from the                            incisors.                           A 4 cm hiatal hernia was present.                           The exam of the esophagus was otherwise normal. No                            stenosis / stricture appreciated.                           Diffuse mildly erythematous mucosa was found in the                            gastric antrum  without focal ulceration. Biopsies                            were taken with a cold forceps from the antrum,                            body, and incisura for Helicobacter pylori testing.                           A few small sessile polyps were found in the                            gastric body. Two representative polyps were                            removed with a cold biopsy forceps. Resection and                            retrieval were complete.                           The exam of the stomach was otherwise normal.                           The duodenal bulb and second portion of the                            duodenum were normal. Complications:            No immediate complications. Estimated blood loss:                            Minimal. Estimated Blood Loss:     Estimated blood loss was minimal. Impression:               - Esophagogastric landmarks identified.                           - 4 cm hiatal hernia.                           - Normal esophagus otherwise - no stenosis /                             stricture appreciated, no dilation performed.                           - Erythematous mucosa in the antrum. Biopsied.                           - A benign appearing few gastric polyps. Resected                            and retrieved.                           -  Normal duodenal bulb and second portion of the                            duodenum. Recommendation:           - Patient has a contact number available for                            emergencies. The signs and symptoms of potential                            delayed complications were discussed with the                            patient. Return to normal activities tomorrow.                            Written discharge instructions were provided to the                            patient.                           - Resume previous diet.                           - Continue present medications.                           - Await pathology results. Remo Lipps P. Armbruster MD, MD 04/25/2017 10:47:52 AM This report has been signed electronically.

## 2017-04-25 NOTE — Progress Notes (Signed)
Called to room to assist during endoscopic procedure.  Patient ID and intended procedure confirmed with present staff. Received instructions for my participation in the procedure from the performing physician.  

## 2017-04-25 NOTE — Progress Notes (Signed)
To recovery, report to RN, VSS. 

## 2017-04-25 NOTE — Progress Notes (Signed)
Pt's states no medical or surgical changes since previsit or office visit. 

## 2017-04-25 NOTE — Patient Instructions (Signed)
YOU HAD AN ENDOSCOPIC PROCEDURE TODAY AT [facility]:   Refer to the procedure report that was given to you for any specific questions about what was found during the examination.  If the procedure report does not answer your questions, please call your gastroenterologist to clarify.  If you requested that your care partner not be given the details of your procedure findings, then the procedure report has been included in a sealed envelope for you to review at your convenience later.  YOU SHOULD EXPECT: Some feelings of bloating in the abdomen. Passage of more gas than usual.  Walking can help get rid of the air that was put into your GI tract during the procedure and reduce the bloating. If you had a lower endoscopy (such as a colonoscopy or flexible sigmoidoscopy) you may notice spotting of blood in your stool or on the toilet paper. If you underwent a bowel prep for your procedure, you may not have a normal bowel movement for a few days.  Please Note:  You might notice some irritation and congestion in your nose or some drainage.  This is from the oxygen used during your procedure.  There is no need for concern and it should clear up in a day or so.  SYMPTOMS TO REPORT IMMEDIATELY:   Following upper endoscopy (EGD)  Vomiting of blood or coffee ground material  New chest pain or pain under the shoulder blades  Painful or persistently difficult swallowing  New shortness of breath  Fever of 100F or higher  Black, tarry-looking stools  For urgent or emergent issues, a gastroenterologist can be reached at any hour by calling [phone].   DIET:  We do recommend a small meal at first, but then you may proceed to your regular diet.  Drink plenty of fluids but you should avoid alcoholic beverages for 24 hours.  ACTIVITY:  You should plan to take it easy for the rest of today and you should NOT DRIVE or use heavy machinery until tomorrow (because of the sedation medicines used  during the test).    FOLLOW UP: Our staff will call the number listed on your records the next business day following your procedure to check on you and address any questions or concerns that you may have regarding the information given to you following your procedure. If we do not reach you, we will leave a message.  However, if you are feeling well and you are not experiencing any problems, there is no need to return our call.  We will assume that you have returned to your regular daily activities without incident.  If any biopsies were taken you will be contacted by phone or by letter within the next 1-3 weeks.  Please call us at [phone] if you have not heard about the biopsies in 3 weeks.    SIGNATURES/CONFIDENTIALITY: You and/or your care partner have signed paperwork which will be entered into your electronic medical record.  These signatures attest to the fact that that the information above on your After Visit Summary has been reviewed and is understood.  Full responsibility of the confidentiality of this discharge information lies with you and/or your care-partner.  Hiatal hernia information given.  Await biopsy results

## 2017-04-26 ENCOUNTER — Telehealth: Payer: Self-pay

## 2017-04-26 NOTE — Telephone Encounter (Signed)
  Follow up Call-  Call Matika Bartell number 04/25/2017  Post procedure Call Ninette Cotta phone  # [phone]  Permission to leave phone message Yes  Some recent data might be hidden     Patient questions:  Do you have a fever, pain , or abdominal swelling? No. Pain Score  0 *  Have you tolerated food without any problems? Yes.    Have you been able to return to your normal activities? Yes.    Do you have any questions about your discharge instructions: Diet   No. Medications  No. Follow up visit  No.  Do you have questions or concerns about your Care? No.  Actions: * If pain score is 4 or above: No action needed, pain <4.

## 2017-04-26 NOTE — Telephone Encounter (Signed)
  Follow up Call-  Call back number 04/25/2017  Post procedure Call Back phone  # [phone]  Permission to leave phone message Yes  Some recent data might be hidden     Left message

## 2017-04-30 ENCOUNTER — Other Ambulatory Visit: Payer: Self-pay | Admitting: Family Medicine

## 2017-04-30 ENCOUNTER — Telehealth: Payer: Self-pay | Admitting: Gastroenterology

## 2017-04-30 NOTE — Telephone Encounter (Signed)
See result note from EGD dated 04-30-17.

## 2017-05-07 ENCOUNTER — Other Ambulatory Visit: Payer: Self-pay | Admitting: Family Medicine

## 2017-05-07 DIAGNOSIS — Z139 Encounter for screening, unspecified: Secondary | ICD-10-CM

## 2017-05-14 DIAGNOSIS — M5441 Lumbago with sciatica, right side: Secondary | ICD-10-CM | POA: Diagnosis not present

## 2017-05-27 ENCOUNTER — Encounter: Payer: Self-pay | Admitting: Family Medicine

## 2017-05-27 ENCOUNTER — Ambulatory Visit (INDEPENDENT_AMBULATORY_CARE_PROVIDER_SITE_OTHER): Payer: Medicare Other | Admitting: Family Medicine

## 2017-05-27 ENCOUNTER — Ambulatory Visit (HOSPITAL_BASED_OUTPATIENT_CLINIC_OR_DEPARTMENT_OTHER)
Admission: RE | Admit: 2017-05-27 | Discharge: 2017-05-27 | Disposition: A | Discharge status: Home / Self Care | Payer: Medicare Other | Source: Ambulatory Visit | Attending: Family Medicine | Admitting: Family Medicine

## 2017-05-27 VITALS — BP 138/62 | HR 64 | Temp 98.2°F | Resp 16 | Wt 159.2 lb

## 2017-05-27 DIAGNOSIS — E118 Type 2 diabetes mellitus with unspecified complications: Secondary | ICD-10-CM | POA: Diagnosis not present

## 2017-05-27 DIAGNOSIS — R51 Headache: Secondary | ICD-10-CM | POA: Diagnosis not present

## 2017-05-27 DIAGNOSIS — I10 Essential (primary) hypertension: Secondary | ICD-10-CM | POA: Diagnosis not present

## 2017-05-27 DIAGNOSIS — K8020 Calculus of gallbladder without cholecystitis without obstruction: Secondary | ICD-10-CM | POA: Insufficient documentation

## 2017-05-27 DIAGNOSIS — R197 Diarrhea, unspecified: Secondary | ICD-10-CM | POA: Diagnosis not present

## 2017-05-27 DIAGNOSIS — Z23 Encounter for immunization: Secondary | ICD-10-CM | POA: Diagnosis not present

## 2017-05-27 DIAGNOSIS — M25559 Pain in unspecified hip: Secondary | ICD-10-CM | POA: Diagnosis not present

## 2017-05-27 DIAGNOSIS — E785 Hyperlipidemia, unspecified: Secondary | ICD-10-CM | POA: Insufficient documentation

## 2017-05-27 DIAGNOSIS — E1169 Type 2 diabetes mellitus with other specified complication: Secondary | ICD-10-CM

## 2017-05-27 DIAGNOSIS — N289 Disorder of kidney and ureter, unspecified: Secondary | ICD-10-CM | POA: Diagnosis not present

## 2017-05-27 DIAGNOSIS — R1013 Epigastric pain: Secondary | ICD-10-CM

## 2017-05-27 DIAGNOSIS — I2510 Atherosclerotic heart disease of native coronary artery without angina pectoris: Secondary | ICD-10-CM

## 2017-05-27 DIAGNOSIS — R519 Headache, unspecified: Secondary | ICD-10-CM

## 2017-05-27 LAB — CBC WITH DIFFERENTIAL/PLATELET
Basophils Absolute: 0 10*3/uL (ref 0.0–0.1)
Basophils Relative: 0.3 % (ref 0.0–3.0)
EOS ABS: 0.2 10*3/uL (ref 0.0–0.7)
EOS PCT: 2.7 % (ref 0.0–5.0)
HCT: 42.8 % (ref 36.0–46.0)
Hemoglobin: 13.9 g/dL (ref 12.0–15.0)
LYMPHS PCT: 16 % (ref 12.0–46.0)
Lymphs Abs: 1.5 10*3/uL (ref 0.7–4.0)
MCHC: 32.5 g/dL (ref 30.0–36.0)
MCV: 93.8 fl (ref 78.0–100.0)
MONO ABS: 0.9 10*3/uL (ref 0.1–1.0)
Monocytes Relative: 9.4 % (ref 3.0–12.0)
NEUTROS PCT: 71.6 % (ref 43.0–77.0)
Neutro Abs: 6.5 10*3/uL (ref 1.4–7.7)
PLATELETS: 153 10*3/uL (ref 150.0–400.0)
RBC: 4.56 Mil/uL (ref 3.87–5.11)
RDW: 13.3 % (ref 11.5–15.5)
WBC: 9.1 10*3/uL (ref 4.0–10.5)

## 2017-05-27 LAB — SEDIMENTATION RATE: SED RATE: 3 mm/h (ref 0–30)

## 2017-05-27 LAB — COMPREHENSIVE METABOLIC PANEL
ALBUMIN: 3.9 g/dL (ref 3.5–5.2)
ALK PHOS: 83 U/L (ref 39–117)
ALT: 20 U/L (ref 0–35)
AST: 13 U/L (ref 0–37)
BILIRUBIN TOTAL: 0.5 mg/dL (ref 0.2–1.2)
BUN: 52 mg/dL — ABNORMAL HIGH (ref 6–23)
CALCIUM: 9.5 mg/dL (ref 8.4–10.5)
CO2: 27 meq/L (ref 19–32)
CREATININE: 1.95 mg/dL — AB (ref 0.40–1.20)
Chloride: 106 mEq/L (ref 96–112)
GFR: 26.19 mL/min — ABNORMAL LOW (ref >60.00)
Glucose, Bld: 144 mg/dL — ABNORMAL HIGH (ref 70–99)
Potassium: 4.7 mEq/L (ref 3.5–5.1)
Sodium: 139 mEq/L (ref 135–145)
TOTAL PROTEIN: 6.2 g/dL (ref 6.0–8.3)

## 2017-05-27 LAB — HEMOGLOBIN A1C: HEMOGLOBIN A1C: 7.5 % — AB (ref 4.6–6.5)

## 2017-05-27 LAB — TSH: TSH: 1.49 u[IU]/mL (ref 0.35–4.50)

## 2017-05-27 LAB — LIPID PANEL
CHOL/HDL RATIO: 3
Cholesterol: 155 mg/dL (ref 0–200)
HDL: 59.4 mg/dL (ref >39.00)
LDL Cholesterol: 58 mg/dL (ref 0–99)
NONHDL: 95.12
TRIGLYCERIDES: 185 mg/dL — AB (ref 0.0–149.0)
VLDL: 37 mg/dL (ref 0.0–40.0)

## 2017-05-27 NOTE — Assessment & Plan Note (Signed)
Tolerating statin, encouraged heart healthy diet, avoid trans fats, minimize simple carbs and saturated fats. Increase exercise as tolerated 

## 2017-05-27 NOTE — Assessment & Plan Note (Addendum)
Well controlled, no changes to meds. Encouraged heart healthy diet such as the DASH diet and exercise as tolerated. Given flu shot today 

## 2017-05-27 NOTE — Progress Notes (Signed)
Subjective:  I acted as a Education administrator for [employer]. Yancey Flemings, [place]   Patient ID: Heather [name], female    DOB: 1934/08/16, 82 y.o.   MRN: [id number]  Chief Complaint  Patient presents with  . Follow-up    HPI  Patient is in today for follow up visit. Is struggling with right hip pain but it is improved since her 10 day course of Prednisone 5 mg. No falls or injury. Also continues to have left sided headaches and she feels her veins over her right temple and forehead bulge when they occur. Routinely moves her bowels 5-6 x daily, loose each morning but over past 2 days had an increased number with some distension and one episode of vomiting. Is still sore some to palpation today and has a history of gallstones. No other neurologic symptoms. No bloody or tarry stool. Feeling somewhat better today. Denies CP/palp/SOB/HA/congestion/fevers or GU c/o. Taking meds as prescribed  Patient Care Team: Mosie Lukes, MD as PCP - General (Family Medicine) Idelle Leech, OD [facility])   Past Medical History:  Diagnosis Date  . Angina   . Atrial fibrillation ([place])   . Borderline diabetes   . Cancer ([place])    cervical after hysterectomy   . Cataract    removed both eyes  . Coronary artery disease    post percutaneous coronary intervention in 2006  . Depression with anxiety 01/05/2012  . Dermatitis   . Diabetes mellitus without complication (H[facility])    no meds  . Diarrhea 04/17/2015  . Diverticulitis    hx of  . DJD (degenerative joint disease) of knee 09/18/2011   knees- both, elbow- R, ankles   . DVT, lower extremity ([place])    rt. leg after knee surgery  . Dysrhythmia, cardiac    "palpitations"  . Fatigue   . Gastric ulcer    hx of  . GERD (gastroesophageal reflux disease)    prilosec daily  . H/O dizziness    pre-syncope/due to dehydration  . H/O hiatal hernia   . Headache 04/17/2015  . Hip pain 08/28/2011  . Hx of total knee replacement 11/12/11   left knee  . Hyperlipidemia   .  Hyperlipidemia associated with type 2 diabetes mellitus ([place]) 05/27/2017  . Hypertension    takes medications daily  . Medicare annual wellness visit, subsequent 11/22/2013  . Occasional tremors   . Osteoporosis    taking vitamin d  . Persistent disorder of initiating or maintaining sleep   . Restless legs    takes mirapex daily  . S/P inguinal herniorrhaphy   . Stroke [facility]) 2000   TIA- series that have resolved-  . UTI (lower urinary tract infection) 10/04/2013    Past Surgical History:  Procedure Laterality Date  . ABDOMINAL HYSTERECTOMY    . BRAIN SURGERY     related to stroke   2000 patient unaware states she has not had  . COLONOSCOPY    . CORONARY ANGIOPLASTY  03/2005; 06/2005   "1" plus "1"= "2 total"  . EYE SURGERY  11-16-13   cataract -right eye, left eye summer 2015  . FOOT SURGERY     left foot stress fracture repair  . HERNIA REPAIR     1989- esophageal hernia   . JOINT REPLACEMENT     left  . KNEE ARTHROSCOPY     2 on left and 1 on right  . RIGHT TOTAL KNEE ARTHROPLASTY Right 11/29/2014   Performed by Vickey Huger, MD at  [facility] OR  . TOTAL KNEE ARTHROPLASTY Left 11/12/2011   Performed by Lorn Junes, MD at [facility]  . UPPER GASTROINTESTINAL ENDOSCOPY  2013    Family History  Problem Relation Age of Onset  . Stroke Father        family hx of M 1st degree relative <50  . Coronary artery disease Mother   . Heart disease Mother   . Depression Brother   . Stroke Brother   . Diabetes Brother   . Cancer Brother        bladder with mets  . Diabetes Daughter        borderline  . Hypertension Daughter   . Arthritis Unknown        family hx of  . Hypertension Unknown        family hx of  . Other Unknown        family hx of cardiovascular disorder  . Thyroid disease Daughter   . Breast cancer Neg Hx   . Colon cancer Neg Hx   . Anesthesia problems Neg Hx   . Hypotension Neg Hx   . Malignant hyperthermia Neg Hx   . Pseudochol deficiency Neg Hx   . Colon  polyps Neg Hx   . Esophageal cancer Neg Hx   . Rectal cancer Neg Hx   . Stomach cancer Neg Hx     Social History   Socioeconomic History  . Marital status: Married    Spouse name: Not on file  . Number of children: 3  . Years of education: 77  . Highest education level: Not on file  Social Needs  . Financial resource strain: Not on file  . Food insecurity - worry: Not on file  . Food insecurity - inability: Not on file  . Transportation needs - medical: Not on file  . Transportation needs - non-medical: Not on file  Occupational History  . 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 Smoking status: Never Smoker  . Smokeless tobacco: Never Used  Substance and Sexual Activity  . Alcohol use: No    Alcohol/week: 0.0 oz  . Drug use: No  . Sexual activity: Yes    Birth control/protection: Surgical    Comment: lives with husband, no dietary restrictions  Other Topics Concern  . Not on file  Social History Narrative   Lives with husband   Caffeine use- none    Outpatient Medications Prior to Visit  Medication Sig Dispense Refill  . amLODipine (NORVASC) 10 MG tablet TAKE 1/2 TABLET EVERY DAY 45 tablet 0  . aspirin EC 81 MG tablet Take 1 tablet (81 mg total) by mouth daily.    . Blood Glucose Monitoring Suppl (ONE TOUCH ULTRA SYSTEM KIT) W/DEVICE KIT 1 kit by Does not apply route once. DX: 250.00 Check sugars daily as needed 1 each 0  . cholecalciferol (VITAMIN D) 1000 UNITS tablet Take 1,000 Units by mouth daily.    . diclofenac sodium (VOLTAREN) 1 % GEL Apply 2 g topically 3 (three) times daily as needed for pain.    Marland Kitchen glucose blood (ONE TOUCH TEST STRIPS) test strip Check BS daily and prn DX: E11.9 100 each 11  . hyoscyamine (LEVSIN SL) 0.125 MG SL tablet Place 1 tablet (0.125 mg total) under the tongue every 4 (four) hours as needed. 30 tablet 0  . isosorbide mononitrate (IMDUR) 60 MG 24 hr tablet TAKE 1/2 TABLET  EVERY DAY  45 tablet 1  . KLOR-CON M20 20 MEQ tablet TAKE 1 TABLET EVERY DAY 90 tablet 1  . losartan (COZAAR) 100 MG tablet TAKE 1 TABLET EVERY DAY 90 tablet 0  . magnesium oxide (MAG-OX) 400 MG tablet Take 400 mg by mouth 2 (two) times daily.     . metoprolol succinate (TOPROL-XL) 50 MG 24 hr tablet TAKE 1 TABLET TWICE DAILY IMMEDIATELY FOLLOWING A MEAL 180 tablet 1  . omeprazole (PRILOSEC) 20 MG capsule Take 20 mg by mouth 2 (two) times daily before a meal.    . ONETOUCH DELICA LANCETS FINE MISC Check sugars daily and prn  DX 250.00 100 each 1  . pramipexole (MIRAPEX) 0.25 MG tablet TAKE 1 TABLET TWICE DAILY 180 tablet 0  . simvastatin (ZOCOR) 20 MG tablet TAKE 1 TABLET AT BEDTIME 90 tablet 1  . triamterene-hydrochlorothiazide (MAXZIDE-25) 37.5-25 MG tablet TAKE 1 TABLET EVERY DAY 90 tablet 0   No facility-administered medications prior to visit.     Allergies  Allergen Reactions  . Nitrofurantoin Rash  . Baclofen Other (See Comments)    Hyperactivity     Review of Systems  Constitutional: Negative for fever and malaise/fatigue.  HENT: Negative for congestion.   Eyes: Negative for blurred vision.  Respiratory: Negative for shortness of breath.   Cardiovascular: Negative for chest pain, palpitations and leg swelling.  Gastrointestinal: Positive for abdominal pain, diarrhea and vomiting. Negative for blood in stool, constipation, melena and nausea.  Genitourinary: Negative for dysuria and frequency.  Musculoskeletal: Positive for joint pain. Negative for falls.  Skin: Negative for rash.  Neurological: Negative for dizziness, loss of consciousness and headaches.  Endo/Heme/Allergies: Negative for environmental allergies.  Psychiatric/Behavioral: Negative for depression. The patient is not nervous/anxious.        Objective:    Physical Exam  Constitutional: She is oriented to person, place, and time. She appears well-developed and well-nourished. No distress.  HENT:  Head:  Normocephalic and atraumatic.  Nose: Nose normal.  Eyes: Right eye exhibits no discharge. Left eye exhibits no discharge.  Neck: Normal range of motion. Neck supple.  Cardiovascular: Normal rate and regular rhythm.  No murmur heard. Pulmonary/Chest: Effort normal and breath sounds normal.  Abdominal: Soft. Bowel sounds are normal. There is no tenderness.  Musculoskeletal: She exhibits no edema.  Neurological: She is alert and oriented to person, place, and time.  Skin: Skin is warm and dry.  Psychiatric: She has a normal mood and affect.  Nursing note and vitals reviewed.   BP 138/62 (BP Location: Right Arm, Patient Position: Sitting, Cuff Size: Small)   Pulse 64   Temp 98.2 F (36.8 C) (Oral)   Resp 16   Wt 159 lb 3.2 oz (72.2 kg)   SpO2 97%   BMI 28.20 kg/m  Wt Readings from Last 3 Encounters:  05/27/17 159 lb 3.2 oz (72.2 kg)  04/25/17 162 lb (73.5 kg)  04/18/17 164 lb 3.2 oz (74.5 kg)   BP Readings from Last 3 Encounters:  05/27/17 138/62  04/25/17 (!) 159/66  01/28/17 (!) 156/59     Immunization History  Administered Date(s) Administered  . Influenza Whole 06/08/2005, 06/29/2010  . Influenza, High Dose Seasonal PF 04/05/2015, 06/18/2016, 05/27/2017  . Influenza,inj,Quad PF,6+ Mos 04/30/2014  . Pneumococcal Conjugate-13 11/17/2013  . Pneumococcal Polysaccharide-23 07/09/2000  . Tdap 05/24/2015    Health Maintenance  Topic Date Due  . PNA vac Low Risk Adult (2 of 2 - PPSV23) 11/18/2014  . HEMOGLOBIN A1C  10/03/2015  . OPHTHALMOLOGY EXAM  08/28/2016  . INFLUENZA VACCINE  02/06/2017  . FOOT EXAM  06/18/2017  . TETANUS/TDAP  05/23/2025  . DEXA SCAN  Completed    Lab Results  Component Value Date   WBC 5.7 10/22/2016   HGB 12.5 10/22/2016   HCT 37.1 10/22/2016   PLT 153 10/22/2016   GLUCOSE 120 (H) 10/22/2016   CHOL 151 04/05/2015   TRIG 153.0 (H) 04/05/2015   HDL 59.90 04/05/2015   LDLDIRECT 73.4 05/20/2014   LDLCALC 60 04/05/2015   ALT 14 10/22/2016    AST 21 10/22/2016   NA 138 10/22/2016   K 4.3 10/22/2016   CL 104 10/22/2016   CREATININE 1.24 (H) 10/22/2016   BUN 28 (H) 10/22/2016   CO2 25 10/22/2016   TSH 1.29 04/05/2015   INR 0.98 11/16/2014   HGBA1C 6.8 (H) 04/05/2015    Lab Results  Component Value Date   TSH 1.29 04/05/2015   Lab Results  Component Value Date   WBC 5.7 10/22/2016   HGB 12.5 10/22/2016   HCT 37.1 10/22/2016   MCV 91.2 10/22/2016   PLT 153 10/22/2016   Lab Results  Component Value Date   NA 138 10/22/2016   K 4.3 10/22/2016   CO2 25 10/22/2016   GLUCOSE 120 (H) 10/22/2016   BUN 28 (H) 10/22/2016   CREATININE 1.24 (H) 10/22/2016   BILITOT 0.8 10/22/2016   ALKPHOS 79 10/22/2016   AST 21 10/22/2016   ALT 14 10/22/2016   PROT 6.7 10/22/2016   ALBUMIN 4.3 10/22/2016   CALCIUM 9.4 10/22/2016   ANIONGAP 9 10/22/2016   GFR 69.61 04/05/2015   Lab Results  Component Value Date   CHOL 151 04/05/2015   Lab Results  Component Value Date   HDL 59.90 04/05/2015   Lab Results  Component Value Date   LDLCALC 60 04/05/2015   Lab Results  Component Value Date   TRIG 153.0 (H) 04/05/2015   Lab Results  Component Value Date   CHOLHDL 3 04/05/2015   Lab Results  Component Value Date   HGBA1C 6.8 (H) 04/05/2015         Assessment & Plan:   Problem List Items Addressed This Visit    Essential hypertension    Well controlled, no changes to meds. Encouraged heart healthy diet such as the DASH diet and exercise as tolerated. Given flu shot today      Relevant Orders   Comprehensive metabolic panel   TSH   CBC w/Diff   Hip pain    has just finished a 10 day course of Prednisone 5 mg daily for a flare in right hip pain caused by low back pathology. If now tolerable and Dr Noemi Chapel is considering an MRI if trouble persists      Renal insufficiency    Mild, check cmp, maintain adequate hydration      DM (diabetes mellitus) ([place])    hgba1c acceptable, minimize simple carbs. Increase  exercise as tolerated. Continue current meds      Relevant Orders   Hemoglobin A1c   Diarrhea    Routinely moves her bowels 5-6 x daily, loose each morning but over past 2 days had an increased number with some distension and one episode of vomiting. Is still sore some to palpation today and has a history of gallstones. Will check labs and ultrasound and she will maintain a low fat diet. Add Benefiber bid      Headache    Left  sided headache will check labs. Encouraged increased hydration, 64 ounces of clear fluids daily. Minimize alcohol and caffeine. Eat small frequent meals with lean proteins and complex carbs. Avoid high and low blood sugars. Get adequate sleep, 7-8 hours a night. Needs exercise daily preferably in the morning. She cannot identify a pattern as to when they occur. They only last seconds but they are shooting and can occur in clusters or can be singular. No other associated neurologic symptoms. Does not awaken with them. Check a Sed Rate and referred back to neurology      Relevant Orders   Ambulatory referral to Neurology   Sedimentation rate   Hyperlipidemia associated with type 2 diabetes mellitus ([place])    Tolerating statin, encouraged heart healthy diet, avoid trans fats, minimize simple carbs and saturated fats. Increase exercise as tolerated      Relevant Orders   Lipid panel    Other Visit Diagnoses    Needs flu shot    -  Primary   Relevant Orders   Flu vaccine HIGH DOSE PF (Fluzone High dose) (Completed)   Epigastric abdominal pain       Relevant Orders   US Abdomen Complete   CBC w/Diff      I am having Anneliese M. Mickelsen maintain her magnesium oxide, ONE TOUCH ULTRA SYSTEM KIT, ONETOUCH DELICA LANCETS FINE, omeprazole, glucose blood, cholecalciferol, aspirin EC, diclofenac sodium, hyoscyamine, isosorbide mononitrate, pramipexole, triamterene-hydrochlorothiazide, amLODipine, KLOR-CON M20, simvastatin, metoprolol succinate, and losartan.  No orders of the  defined types were placed in this encounter.   CMA served as Education administrator during this visit. History, Physical and Plan performed by medical provider. Documentation and orders reviewed and attested to.  [name], MD

## 2017-05-27 NOTE — Addendum Note (Signed)
Addended by: Magdalene Molly A on: 05/27/2017 10:24 AM   Modules accepted: Orders

## 2017-05-27 NOTE — Patient Instructions (Addendum)
Shingrix is the new shingles shot, 2 shots over 2-6 months can get at the pharmacy  benefiber twice daily   Hypertension Hypertension is another name for high blood pressure. High blood pressure forces your heart to work harder to pump blood. This can cause problems over time. There are two numbers in a blood pressure reading. There is a top number (systolic) over a bottom number (diastolic). It is best to have a blood pressure below 120/80. Healthy choices can help lower your blood pressure. You may need medicine to help lower your blood pressure if:  Your blood pressure cannot be lowered with healthy choices.  Your blood pressure is higher than 130/80.  Follow these instructions at home: Eating and drinking  If directed, follow the DASH eating plan. This diet includes: ? Filling half of your plate at each meal with fruits and vegetables. ? Filling one quarter of your plate at each meal with whole grains. Whole grains include whole wheat pasta, brown rice, and whole grain bread. ? Eating or drinking low-fat dairy products, such as skim milk or low-fat yogurt. ? Filling one quarter of your plate at each meal with low-fat (lean) proteins. Low-fat proteins include fish, skinless chicken, eggs, beans, and tofu. ? Avoiding fatty meat, cured and processed meat, or chicken with skin. ? Avoiding premade or processed food.  Eat less than 1,500 mg of salt (sodium) a day.  Limit alcohol use to no more than 1 drink a day for nonpregnant women and 2 drinks a day for men. One drink equals 12 oz of beer, 5 oz of wine, or 1 oz of hard liquor. Lifestyle  Work with your doctor to stay at a healthy weight or to lose weight. Ask your doctor what the best weight is for you.  Get at least 30 minutes of exercise that causes your heart to beat faster (aerobic exercise) most days of the week. This may include walking, swimming, or biking.  Get at least 30 minutes of exercise that strengthens your muscles  (resistance exercise) at least 3 days a week. This may include lifting weights or pilates.  Do not use any products that contain nicotine or tobacco. This includes cigarettes and e-cigarettes. If you need help quitting, ask your doctor.  Check your blood pressure at home as told by your doctor.  Keep all follow-up visits as told by your doctor. This is important. Medicines  Take over-the-counter and prescription medicines only as told by your doctor. Follow directions carefully.  Do not skip doses of blood pressure medicine. The medicine does not work as well if you skip doses. Skipping doses also puts you at risk for problems.  Ask your doctor about side effects or reactions to medicines that you should watch for. Contact a doctor if:  You think you are having a reaction to the medicine you are taking.  You have headaches that keep coming back (recurring).  You feel dizzy.  You have swelling in your ankles.  You have trouble with your vision. Get help right away if:  You get a very bad headache.  You start to feel confused.  You feel weak or numb.  You feel faint.  You get very bad pain in your: ? Chest. ? Belly (abdomen).  You throw up (vomit) more than once.  You have trouble breathing. Summary  Hypertension is another name for high blood pressure.  Making healthy choices can help lower blood pressure. If your blood pressure cannot be controlled with healthy  choices, you may need to take medicine. This information is not intended to replace advice given to you by your health care provider. Make sure you discuss any questions you have with your health care provider. Document Released: 12/12/2007 Document Revised: 05/23/2016 Document Reviewed: 05/23/2016 Elsevier Interactive Patient Education  2018 [employer].  Diarrhea, Adult Diarrhea is when you have loose and water poop (stool) often. Diarrhea can make you feel weak and cause you to get dehydrated.  Dehydration can make you tired and thirsty, make you have a dry mouth, and make it so you pee (urinate) less often. Diarrhea often lasts 2-3 days. However, it can last longer if it is a sign of something more serious. It is important to treat your diarrhea as told by your doctor. Follow these instructions at home: Eating and drinking  Follow these recommendations as told by your doctor:  Take an oral rehydration solution (ORS). This is a drink that is sold at pharmacies and stores.  Drink clear fluids, such as: ? Water. ? Ice chips. ? Diluted fruit juice. ? Low-calorie sports drinks.  Eat bland, easy-to-digest foods in small amounts as you are able. These foods include: ? Bananas. ? Applesauce. ? Rice. ? Low-fat (lean) meats. ? Toast. ? Crackers.  Avoid drinking fluids that have a lot of sugar or caffeine in them.  Avoid alcohol.  Avoid spicy or fatty foods.  General instructions   Drink enough fluid to keep your pee (urine) clear or pale yellow.  Wash your hands often. If you cannot use soap and water, use hand sanitizer.  Make sure that all people in your home wash their hands well and often.  Take over-the-counter and prescription medicines only as told by your doctor.  Rest at home while you get better.  Watch your condition for any changes.  Take a warm bath to help with any burning or pain from having diarrhea.  Keep all follow-up visits as told by your doctor. This is important. Contact a doctor if:  You have a fever.  Your diarrhea gets worse.  You have new symptoms.  You cannot keep fluids down.  You feel light-headed or dizzy.  You have a headache.  You have muscle cramps. Get help right away if:  You have chest pain.  You feel very weak or you pass out (faint).  You have bloody or black poop or poop that look like tar.  You have very bad pain, cramping, or bloating in your belly (abdomen).  You have trouble breathing or you are  breathing very quickly.  Your heart is beating very quickly.  Your skin feels cold and clammy.  You feel confused.  You have signs of dehydration, such as: ? Dark pee, hardly any pee, or no pee. ? Cracked lips. ? Dry mouth. ? Sunken eyes. ? Sleepiness. ? Weakness. This information is not intended to replace advice given to you by your health care provider. Make sure you discuss any questions you have with your health care provider. Document Released: 12/12/2007 Document Revised: 01/13/2016 Document Reviewed: 03/01/2015 Elsevier Interactive Patient Education  2018 [employer].

## 2017-05-27 NOTE — Assessment & Plan Note (Signed)
Mild, check cmp, maintain adequate hydration

## 2017-05-27 NOTE — Assessment & Plan Note (Signed)
hgba1c acceptable, minimize simple carbs. Increase exercise as tolerated. Continue current meds 

## 2017-05-27 NOTE — Assessment & Plan Note (Addendum)
Routinely moves her bowels 5-6 x daily, loose each morning but over past 2 days had an increased number with some distension and one episode of vomiting. Is still sore some to palpation today and has a history of gallstones. Will check labs and ultrasound and she will maintain a low fat diet. Add Benefiber bid

## 2017-05-27 NOTE — Assessment & Plan Note (Signed)
has just finished a 10 day course of Prednisone 5 mg daily for a flare in right hip pain caused by low back pathology. If now tolerable and Dr Noemi Chapel is considering an MRI if trouble persists

## 2017-05-27 NOTE — Assessment & Plan Note (Addendum)
Left sided headache will check labs. Encouraged increased hydration, 64 ounces of clear fluids daily. Minimize alcohol and caffeine. Eat small frequent meals with lean proteins and complex carbs. Avoid high and low blood sugars. Get adequate sleep, 7-8 hours a night. Needs exercise daily preferably in the morning. She cannot identify a pattern as to when they occur. They only last seconds but they are shooting and can occur in clusters or can be singular. No other associated neurologic symptoms. Does not awaken with them. Check a Sed Rate and referred back to neurology

## 2017-05-28 ENCOUNTER — Ambulatory Visit
Admission: RE | Admit: 2017-05-28 | Discharge: 2017-05-28 | Disposition: A | Discharge status: Home / Self Care | Payer: Medicare Other | Source: Ambulatory Visit | Attending: Family Medicine | Admitting: Family Medicine

## 2017-05-28 DIAGNOSIS — Z1231 Encounter for screening mammogram for malignant neoplasm of breast: Secondary | ICD-10-CM | POA: Diagnosis not present

## 2017-05-28 DIAGNOSIS — Z139 Encounter for screening, unspecified: Secondary | ICD-10-CM

## 2017-06-04 NOTE — Addendum Note (Signed)
Addended by: Magdalene Molly A on: 06/04/2017 12:45 PM   Modules accepted: Orders

## 2017-06-05 ENCOUNTER — Ambulatory Visit (INDEPENDENT_AMBULATORY_CARE_PROVIDER_SITE_OTHER): Payer: Medicare Other | Admitting: Family Medicine

## 2017-06-05 ENCOUNTER — Other Ambulatory Visit (INDEPENDENT_AMBULATORY_CARE_PROVIDER_SITE_OTHER): Payer: Medicare Other

## 2017-06-05 VITALS — BP 168/74 | HR 66

## 2017-06-05 DIAGNOSIS — I2510 Atherosclerotic heart disease of native coronary artery without angina pectoris: Secondary | ICD-10-CM | POA: Diagnosis not present

## 2017-06-05 DIAGNOSIS — N289 Disorder of kidney and ureter, unspecified: Secondary | ICD-10-CM | POA: Diagnosis not present

## 2017-06-05 DIAGNOSIS — I10 Essential (primary) hypertension: Secondary | ICD-10-CM | POA: Diagnosis not present

## 2017-06-05 LAB — COMPREHENSIVE METABOLIC PANEL
ALK PHOS: 76 U/L (ref 39–117)
ALT: 18 U/L (ref 0–35)
AST: 16 U/L (ref 0–37)
Albumin: 3.8 g/dL (ref 3.5–5.2)
BILIRUBIN TOTAL: 0.6 mg/dL (ref 0.2–1.2)
BUN: 30 mg/dL — ABNORMAL HIGH (ref 6–23)
CALCIUM: 9.5 mg/dL (ref 8.4–10.5)
CO2: 30 meq/L (ref 19–32)
CREATININE: 1.65 mg/dL — AB (ref 0.40–1.20)
Chloride: 105 mEq/L (ref 96–112)
GFR: 31.76 mL/min — ABNORMAL LOW (ref >60.00)
GLUCOSE: 152 mg/dL — AB (ref 70–99)
Potassium: 4.8 mEq/L (ref 3.5–5.1)
Sodium: 140 mEq/L (ref 135–145)
TOTAL PROTEIN: 6.3 g/dL (ref 6.0–8.3)

## 2017-06-05 NOTE — Progress Notes (Addendum)
Pre visit review using our clinic tool,if applicable. No additional management support is needed unless otherwise documented below in the visit note.   Patient in for BP check and labwork. Patient states she is not sure why she needed BP check however this is what she is on the schedule for. Last BP = 138/62 P= 64   States she had a few low BP's over the Thanksgiving Holiday. Lowest was 95/44. States her Cardiologist advised her not to take her Amlodipine at the same time she takes her other BP medications. Patient states she took all except Amlodipine today around 8:00.No complaints voiced this visit.  BP today = 168/74 P= 66  Per Dr. Lorelei Pont DOD patient told to take Amlodipine, record BP'S and  follow up with her Cardiologist. Patient to call  1 hour after taking Amlodipine today with BP reading.   I have reviewed and agree- J Copland

## 2017-07-15 ENCOUNTER — Encounter: Payer: Self-pay | Admitting: Family Medicine

## 2017-07-17 DIAGNOSIS — L308 Other specified dermatitis: Secondary | ICD-10-CM | POA: Diagnosis not present

## 2017-07-17 DIAGNOSIS — L570 Actinic keratosis: Secondary | ICD-10-CM | POA: Diagnosis not present

## 2017-07-17 DIAGNOSIS — X32XXXD Exposure to sunlight, subsequent encounter: Secondary | ICD-10-CM | POA: Diagnosis not present

## 2017-07-18 ENCOUNTER — Other Ambulatory Visit: Payer: Self-pay | Admitting: Family Medicine

## 2017-08-05 ENCOUNTER — Encounter: Payer: Self-pay | Admitting: Diagnostic Neuroimaging

## 2017-08-05 ENCOUNTER — Ambulatory Visit (INDEPENDENT_AMBULATORY_CARE_PROVIDER_SITE_OTHER): Payer: Medicare Other | Admitting: Diagnostic Neuroimaging

## 2017-08-05 VITALS — BP 140/72 | HR 63 | Ht 63.0 in | Wt 163.4 lb

## 2017-08-05 DIAGNOSIS — G44059 Short lasting unilateral neuralgiform headache with conjunctival injection and tearing (SUNCT), not intractable: Secondary | ICD-10-CM | POA: Diagnosis not present

## 2017-08-05 DIAGNOSIS — G500 Trigeminal neuralgia: Secondary | ICD-10-CM

## 2017-08-05 DIAGNOSIS — G4700 Insomnia, unspecified: Secondary | ICD-10-CM

## 2017-08-05 DIAGNOSIS — G529 Cranial nerve disorder, unspecified: Secondary | ICD-10-CM

## 2017-08-05 NOTE — Progress Notes (Signed)
[facility]  PATIENT: Heather [name] DOB: 18-Jan-1935  REFERRING CLINICIAN: Frederik Pear HISTORY FROM: patient and chart review REASON FOR VISIT: new consult    HISTORICAL  CHIEF COMPLAINT:  Chief Complaint  Patient presents with  . NP  Dr. Gwyneth Revels  . Headache    Ice pick headaches for the last 6 months, over the left eye, blood vessel more prominent after these episodes.     HISTORY OF PRESENT ILLNESS:   83 year old female here for evaluation of left supraorbital pain.  Past 7 months patient has had intermittent episodes of pain above her left eye, lasting for a few seconds at a time.  Sometimes she feels bulging blood vessels in the left temporal region.  Sometimes she has tearing in her left eye.  Initially symptoms were happening a few times per day, but now happen only a few times per week.  No specific triggering or aggravating factors.  No similar symptoms in the past.  Patient has history of migraine headaches since age 27 years old with global headaches, nausea, photophobia, phonophobia, severe headaches lasting for hours at a time.  They stopped around age 39 years old.  Patient also has significant insomnia problems, averaging 3-4 hours of sleep per night.  She tends to have racing thoughts, concerned about her financial issues, and other stress factors when she lays down to sleep.  She also has issues with temperature in her bedroom, where she prefers a cool temperature and her husband performs a warm temperature.   REVIEW OF SYSTEMS: Full 14 system review of systems performed and negative with exception of: Insomnia headache feeling hot flushing tremor cramps hearing loss rash incontinence.  ALLERGIES: Allergies  Allergen Reactions  . Nitrofurantoin Rash  . Baclofen Other (See Comments)    Hyperactivity     HOME MEDICATIONS: Outpatient Medications Prior to Visit  Medication Sig Dispense Refill  . amLODipine (NORVASC) 10 MG tablet TAKE  1/2 TABLET EVERY DAY 45 tablet 0  . aspirin EC 81 MG tablet Take 1 tablet (81 mg total) by mouth daily.    . Blood Glucose Monitoring Suppl (ONE TOUCH ULTRA SYSTEM KIT) W/DEVICE KIT 1 kit by Does not apply route once. DX: 250.00 Check sugars daily as needed 1 each 0  . cholecalciferol (VITAMIN D) 1000 UNITS tablet Take 1,000 Units by mouth daily.    . diclofenac sodium (VOLTAREN) 1 % GEL Apply 2 g topically 3 (three) times daily as needed for pain.    Marland Kitchen glucose blood (ONE TOUCH TEST STRIPS) test strip Check BS daily and prn DX: E11.9 100 each 11  . isosorbide mononitrate (IMDUR) 60 MG 24 hr tablet TAKE 1/2 TABLET EVERY DAY 45 tablet 1  . KLOR-CON M20 20 MEQ tablet TAKE 1 TABLET EVERY DAY 90 tablet 1  . losartan (COZAAR) 100 MG tablet TAKE 1 TABLET EVERY DAY 90 tablet 0  . magnesium oxide (MAG-OX) 400 MG tablet Take 400 mg by mouth 2 (two) times daily.     . metoprolol succinate (TOPROL-XL) 50 MG 24 hr tablet TAKE 1 TABLET TWICE DAILY IMMEDIATELY FOLLOWING A MEAL 180 tablet 1  . omeprazole (PRILOSEC) 20 MG capsule Take 20 mg by mouth daily.     Glory Rosebush DELICA LANCETS FINE MISC Check sugars daily and prn  DX 250.00 100 each 1  . pramipexole (MIRAPEX) 0.25 MG tablet TAKE 1 TABLET TWICE DAILY 180 tablet 0  . simvastatin (ZOCOR) 20 MG tablet TAKE 1 TABLET AT BEDTIME  90 tablet 1  . triamterene-hydrochlorothiazide (MAXZIDE-25) 37.5-25 MG tablet TAKE 1 TABLET EVERY DAY 90 tablet 0  . hyoscyamine (LEVSIN SL) 0.125 MG SL tablet Place 1 tablet (0.125 mg total) under the tongue every 4 (four) hours as needed. (Patient not taking: Reported on 08/05/2017) 30 tablet 0   No facility-administered medications prior to visit.     PAST MEDICAL HISTORY: Past Medical History:  Diagnosis Date  . Angina   . Atrial fibrillation ([place])   . Borderline diabetes   . Cancer ([place])    cervical after hysterectomy   . Cataract    removed both eyes  . Coronary artery disease    post percutaneous coronary  intervention in 2006  . Depression with anxiety 01/05/2012  . Dermatitis   . Diabetes mellitus without complication (HCC)    no meds  . Diarrhea 04/17/2015  . Diverticulitis    hx of  . DJD (degenerative joint disease) of knee 09/18/2011   knees- both, elbow- R, ankles   . DVT, lower extremity ([place])    rt. leg after knee surgery  . Dysrhythmia, cardiac    "palpitations"  . Fatigue   . Gastric ulcer    hx of  . GERD (gastroesophageal reflux disease)    prilosec daily  . H/O dizziness    pre-syncope/due to dehydration  . H/O hiatal hernia   . Headache 04/17/2015  . Hip pain 08/28/2011  . Hx of total knee replacement 11/12/11   left knee  . Hyperlipidemia   . Hyperlipidemia associated with type 2 diabetes mellitus ([place]) 05/27/2017  . Hypertension    takes medications daily  . Medicare annual wellness visit, subsequent 11/22/2013  . Occasional tremors   . Osteoporosis    taking vitamin d  . Persistent disorder of initiating or maintaining sleep   . Restless legs    takes mirapex daily  . S/P inguinal herniorrhaphy   . Stroke [facility]) 2000   TIA- series that have resolved-  . UTI (lower urinary tract infection) 10/04/2013    PAST SURGICAL HISTORY: Past Surgical History:  Procedure Laterality Date  . ABDOMINAL HYSTERECTOMY    . BRAIN SURGERY     related to stroke   2000 patient unaware states she has not had  . COLONOSCOPY    . CORONARY ANGIOPLASTY  03/2005; 06/2005   "1" plus "1"= "2 total"  . EYE SURGERY  11-16-13   cataract -right eye, left eye summer 2015  . FOOT SURGERY     left foot stress fracture repair  . HERNIA REPAIR     1989- esophageal hernia   . JOINT REPLACEMENT     left  . KNEE ARTHROSCOPY     2 on left and 1 on right  . TOTAL KNEE ARTHROPLASTY  11/12/2011   Procedure: TOTAL KNEE ARTHROPLASTY;  Surgeon: Lorn Junes, MD;  Location: [place];  Service: Orthopedics;  Laterality: Left;  Dr Noemi Chapel wants 90 minutes for this case  . TOTAL KNEE ARTHROPLASTY Right  11/29/2014   Procedure: RIGHT TOTAL KNEE ARTHROPLASTY;  Surgeon: Vickey Huger, MD;  Location: [facility];  Service: Orthopedics;  Laterality: Right;  . UPPER GASTROINTESTINAL ENDOSCOPY  2013    FAMILY HISTORY: Family History  Problem Relation Age of Onset  . Stroke Father        family hx of M 1st degree relative <50  . Coronary artery disease Mother   . Heart disease Mother   . Depression Brother   . Stroke Brother   .  Diabetes Brother   . Cancer Brother        bladder with mets  . Diabetes Daughter        borderline  . Hypertension Daughter   . Arthritis Unknown        family hx of  . Hypertension Unknown        family hx of  . Other Unknown        family hx of cardiovascular disorder  . Thyroid disease Daughter   . Breast cancer Neg Hx   . Colon cancer Neg Hx   . Anesthesia problems Neg Hx   . Hypotension Neg Hx   . Malignant hyperthermia Neg Hx   . Pseudochol deficiency Neg Hx   . Colon polyps Neg Hx   . Esophageal cancer Neg Hx   . Rectal cancer Neg Hx   . Stomach cancer Neg Hx     SOCIAL HISTORY:  Social History   Socioeconomic History  . Marital status: Married    Spouse name: Not on file  . Number of children: 3  . Years of education: 72  . Highest education level: Not on file  Social Needs  . Financial resource strain: Not on file  . Food insecurity - worry: Not on file  . Food insecurity - inability: Not on file  . Transportation needs - medical: Not on file  . Transportation needs - non-medical: Not on file  Occupational History  . 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 Smoking status: Never Smoker  . Smokeless tobacco: Never Used  Substance and Sexual Activity  . Alcohol use: No    Alcohol/week: 0.0 oz  . Drug use: No  . Sexual activity: Yes    Birth control/protection: Surgical    Comment: lives with husband, no dietary restrictions  Other Topics Concern  . Not on file  Social History  Narrative   Lives with husband, Caffeine use- Half Caffeine)- 2 cups daily.  3 children living, one passed away.   Education: HS. Business college.  Retired.      PHYSICAL EXAM  GENERAL EXAM/CONSTITUTIONAL: Vitals:  Vitals:   08/05/17 0944  BP: 140/72  Pulse: 63  Weight: 163 lb 6.4 oz (74.1 kg)  Height: _0  (1.6 m)     Body mass index is 28.95 kg/m.  Visual Acuity Screening   Right eye Left eye Both eyes  Without correction: 20/30 20/30   With correction:        Patient is in no distress; well developed, nourished and groomed; neck is supple  CARDIOVASCULAR:  Examination of carotid arteries is normal; no carotid bruits  Regular rate and rhythm, no murmurs  Examination of peripheral vascular system by observation and palpation is normal  TEMPORAL ARTERIES PALPABLE AND NON-TENDER  EYES:  Ophthalmoscopic exam of optic discs and posterior segments is normal; no papilledema or hemorrhages  MUSCULOSKELETAL:  Gait, strength, tone, movements noted in Neurologic exam below  NEUROLOGIC: MENTAL STATUS:  MMSE - Mini Mental State Exam 06/18/2016  Orientation to time 5  Orientation to Place 5  Registration 3  Attention/ Calculation 5  Recall 2  Language- name 2 objects 2  Language- repeat 1  Language- follow 3 step command 3  Language- read & follow direction 1  Write a sentence 1  Copy design 1  Total score 29    awake, alert, oriented to person, place and time  recent and  remote memory intact  normal attention and concentration  language fluent, comprehension intact, naming intact,   fund of knowledge appropriate  CRANIAL NERVE:   2nd - no papilledema on fundoscopic exam  2nd, 3rd, 4th, 6th - pupils equal and reactive to light, visual fields full to confrontation, extraocular muscles intact, no nystagmus  5th - facial sensation symmetric  7th - facial strength symmetric  8th - hearing intact  9th - palate elevates symmetrically, uvula  midline  11th - shoulder shrug symmetric  12th - tongue protrusion midline  MOTOR:   normal bulk and tone, full strength in the BUE, BLE  SENSORY:   normal and symmetric to light touch, temperature, vibration  COORDINATION:   finger-nose-finger, fine finger movements normal  REFLEXES:   deep tendon reflexes present and symmetric  GAIT/STATION:   narrow based gait    DIAGNOSTIC DATA (LABS, IMAGING, TESTING) - I reviewed patient records, labs, notes, testing and imaging myself where available.  Lab Results  Component Value Date   WBC 9.1 05/27/2017   HGB 13.9 05/27/2017   HCT 42.8 05/27/2017   MCV 93.8 05/27/2017   PLT 153.0 05/27/2017      Component Value Date/Time   NA 140 06/05/2017 0848   K 4.8 06/05/2017 0848   CL 105 06/05/2017 0848   CO2 30 06/05/2017 0848   GLUCOSE 152 (H) 06/05/2017 0848   BUN 30 (H) 06/05/2017 0848   CREATININE 1.65 (H) 06/05/2017 0848   CREATININE 0.95 11/10/2013 0328   CALCIUM 9.5 06/05/2017 0848   PROT 6.3 06/05/2017 0848   ALBUMIN 3.8 06/05/2017 0848   AST 16 06/05/2017 0848   ALT 18 06/05/2017 0848   ALKPHOS 76 06/05/2017 0848   BILITOT 0.6 06/05/2017 0848   GFRNONAA 40 (L) 10/22/2016 1434   GFRAA 46 (L) 10/22/2016 1434   Lab Results  Component Value Date   CHOL 155 05/27/2017   HDL 59.40 05/27/2017   LDLCALC 58 05/27/2017   LDLDIRECT 73.4 05/20/2014   TRIG 185.0 (H) 05/27/2017   CHOLHDL 3 05/27/2017   Lab Results  Component Value Date   HGBA1C 7.5 (H) 05/27/2017   No results found for: VITAMINB12 Lab Results  Component Value Date   TSH 1.49 05/27/2017     04/01/13 CT head [I reviewed images myself and agree with interpretation. -VRP]  - negative    ASSESSMENT AND PLAN  83 y.o. year old female here with intermittent sharp, transient pain in her left supraorbital region, left temporal region, associated with left eye tearing sensation.  Episodes last few seconds at a time, few times per week.  Could  represent left supraorbital neuralgia, trigeminal autonomic cephalgia, short unilateral neuralgiform headache with conjunctival injection and tearing (SUNCT). Will check MRI to rule out other causes.   Dx:  1. Supraorbital neuralgia   2. Insomnia, unspecified type   3. Short lasting unilateral neuralgiform headache with conjunctival injection and tearing (SUNCT), not intractable      PLAN:  LEFT SUPRAORBITAL PAIN / NEURALGIA (SUNCT, cluster HA, other TAC, other secondary cause) - check MRI brain  - repeat ESR, CRP; may consider temporal artery biopsy if symptoms continue - ibuprofen as needed - may consider indomethacin and gabapentin in future  INSOMNIA - reviewed sleep hygiene - restart gradual exercise routine - consider sleep psychology evaluation (stress mgmt)  Orders Placed This Encounter  Procedures  . MR BRAIN WO CONTRAST  . Sedimentation Rate  . C-reactive Protein   Return in about 6 months (around  02/02/2018).   Penni Bombard, MD 0/04/711, 19:75 AM Certified in Neurology, Neurophysiology and Neuroimaging  [facility] [address] [place], [place] [address] [phone]

## 2017-08-05 NOTE — Patient Instructions (Signed)
  LEFT EYE / HEAD PAIN - check MRI brain  - repeat labs testing - ibuprofen as needed  INSOMNIA - reviewed sleep hygiene (review website [email or web address].[email or web address]/) - restart gradual exercise routine - consider sleep psychology evaluation (stress mgmt)

## 2017-08-06 ENCOUNTER — Telehealth: Payer: Self-pay | Admitting: *Deleted

## 2017-08-06 LAB — C-REACTIVE PROTEIN: CRP: 0.6 mg/L (ref 0.0–4.9)

## 2017-08-06 LAB — SEDIMENTATION RATE: Sed Rate: 2 mm/hr (ref 0–40)

## 2017-08-06 NOTE — Telephone Encounter (Signed)
-----   Message from Penni Bombard, MD sent at 08/06/2017 11:15 AM EST ----- Normal labs. Please call patient. -VRP

## 2017-08-06 NOTE — Telephone Encounter (Signed)
Spoke to pt and relayed the results of her labs were normal.  She verbalized understanding.

## 2017-08-13 ENCOUNTER — Ambulatory Visit
Admission: RE | Admit: 2017-08-13 | Discharge: 2017-08-13 | Disposition: A | Discharge status: Home / Self Care | Payer: Medicare Other | Source: Ambulatory Visit | Attending: Diagnostic Neuroimaging | Admitting: Diagnostic Neuroimaging

## 2017-08-13 DIAGNOSIS — G44059 Short lasting unilateral neuralgiform headache with conjunctival injection and tearing (SUNCT), not intractable: Secondary | ICD-10-CM | POA: Diagnosis not present

## 2017-08-13 DIAGNOSIS — R51 Headache: Secondary | ICD-10-CM | POA: Diagnosis not present

## 2017-08-14 ENCOUNTER — Telehealth: Payer: Self-pay | Admitting: *Deleted

## 2017-08-14 NOTE — Telephone Encounter (Signed)
Spoke with patient and informed her that her MRI brain results are unremarkable. Reviewed Dr Gladstone Lighter recommendations from last office visit. She stated she began some exercise yesterday. Reminded her of 6 month FU and advised she call sooner for any questions or problems. She verbalized understanding, appreciation.

## 2017-09-16 ENCOUNTER — Ambulatory Visit: Payer: Medicare Other | Admitting: Cardiovascular Disease

## 2017-10-04 ENCOUNTER — Ambulatory Visit (INDEPENDENT_AMBULATORY_CARE_PROVIDER_SITE_OTHER): Payer: Medicare Other | Admitting: Cardiovascular Disease

## 2017-10-04 ENCOUNTER — Encounter: Payer: Self-pay | Admitting: Cardiovascular Disease

## 2017-10-04 VITALS — BP 132/70 | HR 58 | Ht 63.0 in | Wt 163.8 lb

## 2017-10-04 DIAGNOSIS — I10 Essential (primary) hypertension: Secondary | ICD-10-CM

## 2017-10-04 DIAGNOSIS — I2510 Atherosclerotic heart disease of native coronary artery without angina pectoris: Secondary | ICD-10-CM

## 2017-10-04 MED ORDER — ISOSORBIDE MONONITRATE ER 60 MG PO TB24: 30.0000 mg | ORAL_TABLET | Freq: Every day | ORAL | 3 refills | Status: DC

## 2017-10-04 MED ORDER — SIMVASTATIN 20 MG PO TABS: 20.0000 mg | ORAL_TABLET | Freq: Every day | ORAL | 3 refills | Status: DC

## 2017-10-04 MED ORDER — POTASSIUM CHLORIDE CRYS ER 20 MEQ PO TBCR: 20.0000 meq | EXTENDED_RELEASE_TABLET | Freq: Every day | ORAL | 3 refills | Status: DC

## 2017-10-04 MED ORDER — METOPROLOL SUCCINATE ER 50 MG PO TB24: 50.0000 mg | ORAL_TABLET | Freq: Every day | ORAL | 3 refills | Status: DC

## 2017-10-04 NOTE — Patient Instructions (Addendum)
Medication Instructions:  1) STOP MAXIDE 2) DECREASE TOPROL to 50 mg at night time  Labwork: None  Testing/Procedures: None  Follow-Up: You have an appointment in the [place] Nov 15, 2017 at 10:30 AM. Please arrive at 10:15 AM.  Your provider wants you to follow-up in: 1 year with Dr.Cooper. You will receive a reminder letter in the mail two months in advance. If you don't receive a letter, please call our office to schedule the follow-up appointment.    Any Other Special Instructions Will Be Listed Below (If Applicable).     If you need a refill on your cardiac medications before your next appointment, please call your pharmacy.

## 2017-10-04 NOTE — Progress Notes (Signed)
Cardiology Office Note Date:  10/04/2017   ID:  Heather [name], [name] 09-01-1934, MRN [id number]  PCP:  Mosie Lukes, MD  Cardiologist:  [name], MD    Chief Complaint  Patient presents with  . Coronary Artery Disease     History of Present Illness: Heather [name] is a 83 y.o. female who presents for follow-up of CAD.   The patient is here with her daughter today.  She has had a lot of problems with insomnia, daytime hypersomnolence, and periodic lightheadedness that she relates to hypotension.  This morning her blood pressure dropped to about 110/50 and she felt lightheaded and weak with that.  She has not had frank syncope.  She is been trying to drink a lot of water.  She denies any recent problems with chest pain or shortness of breath.  She denies orthopnea, PND, or leg swelling.  She does have palpitations at nighttime and metoprolol succinate has helped reduce this symptom.   Past Medical History:  Diagnosis Date  . Angina   . Atrial fibrillation ([place])   . Borderline diabetes   . Cancer ([place])    cervical after hysterectomy   . Cataract    removed both eyes  . Coronary artery disease    post percutaneous coronary intervention in 2006  . Depression with anxiety 01/05/2012  . Dermatitis   . Diabetes mellitus without complication (HCC)    no meds  . Diarrhea 04/17/2015  . Diverticulitis    hx of  . DJD (degenerative joint disease) of knee 09/18/2011   knees- both, elbow- R, ankles   . DVT, lower extremity ([place])    rt. leg after knee surgery  . Dysrhythmia, cardiac    "palpitations"  . Fatigue   . Gastric ulcer    hx of  . GERD (gastroesophageal reflux disease)    prilosec daily  . H/O dizziness    pre-syncope/due to dehydration  . H/O hiatal hernia   . Headache 04/17/2015  . Hip pain 08/28/2011  . Hx of total knee replacement 11/12/11   left knee  . Hyperlipidemia   . Hyperlipidemia associated with type 2 diabetes mellitus ([place]) 05/27/2017  .  Hypertension    takes medications daily  . Medicare annual wellness visit, subsequent 11/22/2013  . Occasional tremors   . Osteoporosis    taking vitamin d  . Persistent disorder of initiating or maintaining sleep   . Restless legs    takes mirapex daily  . S/P inguinal herniorrhaphy   . Stroke [facility]) 2000   TIA- series that have resolved-  . UTI (lower urinary tract infection) 10/04/2013    Past Surgical History:  Procedure Laterality Date  . ABDOMINAL HYSTERECTOMY    . BRAIN SURGERY     related to stroke   2000 patient unaware states she has not had  . COLONOSCOPY    . CORONARY ANGIOPLASTY  03/2005; 06/2005   "1" plus "1"= "2 total"  . EYE SURGERY  11-16-13   cataract -right eye, left eye summer 2015  . FOOT SURGERY     left foot stress fracture repair  . HERNIA REPAIR     1989- esophageal hernia   . JOINT REPLACEMENT     left  . KNEE ARTHROSCOPY     2 on left and 1 on right  . TOTAL KNEE ARTHROPLASTY  11/12/2011   Procedure: TOTAL KNEE ARTHROPLASTY;  Surgeon: Lorn Junes, MD;  Location: [place];  Service: Orthopedics;  Laterality: Left;  Dr Noemi Chapel wants 90 minutes for this case  . TOTAL KNEE ARTHROPLASTY Right 11/29/2014   Procedure: RIGHT TOTAL KNEE ARTHROPLASTY;  Surgeon: Vickey Huger, MD;  Location: [place];  Service: Orthopedics;  Laterality: Right;  . UPPER GASTROINTESTINAL ENDOSCOPY  2013    Current Outpatient Medications  Medication Sig Dispense Refill  . amLODipine (NORVASC) 10 MG tablet TAKE 1/2 TABLET EVERY DAY 45 tablet 0  . aspirin EC 81 MG tablet Take 1 tablet (81 mg total) by mouth daily.    . Blood Glucose Monitoring Suppl (ONE TOUCH ULTRA SYSTEM KIT) W/DEVICE KIT 1 kit by Does not apply route once. DX: 250.00 Check sugars daily as needed 1 each 0  . cholecalciferol (VITAMIN D) 1000 UNITS tablet Take 1,000 Units by mouth daily.    . diclofenac sodium (VOLTAREN) 1 % GEL Apply 2 g topically 3 (three) times daily as needed for pain.    Marland Kitchen glucose blood (ONE TOUCH  TEST STRIPS) test strip Check BS daily and prn DX: E11.9 100 each 11  . hyoscyamine (LEVSIN SL) 0.125 MG SL tablet Place 1 tablet (0.125 mg total) under the tongue every 4 (four) hours as needed. 30 tablet 0  . isosorbide mononitrate (IMDUR) 60 MG 24 hr tablet TAKE 1/2 TABLET EVERY DAY 45 tablet 1  . KLOR-CON M20 20 MEQ tablet TAKE 1 TABLET EVERY DAY 90 tablet 1  . losartan (COZAAR) 100 MG tablet TAKE 1 TABLET EVERY DAY 90 tablet 0  . magnesium oxide (MAG-OX) 400 MG tablet Take 400 mg by mouth 2 (two) times daily.     . metoprolol succinate (TOPROL-XL) 50 MG 24 hr tablet TAKE 1 TABLET TWICE DAILY IMMEDIATELY FOLLOWING A MEAL 180 tablet 1  . omeprazole (PRILOSEC) 20 MG capsule Take 20 mg by mouth daily.     Glory Rosebush DELICA LANCETS FINE MISC Check sugars daily and prn  DX 250.00 100 each 1  . pramipexole (MIRAPEX) 0.25 MG tablet TAKE 1 TABLET TWICE DAILY 180 tablet 0  . simvastatin (ZOCOR) 20 MG tablet TAKE 1 TABLET AT BEDTIME 90 tablet 1  . triamterene-hydrochlorothiazide (MAXZIDE-25) 37.5-25 MG tablet TAKE 1 TABLET EVERY DAY 90 tablet 0   No current facility-administered medications for this visit.     Allergies:   Nitrofurantoin and Baclofen   Social History:  The patient  reports that she has never smoked. She has never used smokeless tobacco. She reports that she does not drink alcohol or use drugs.   Family History:  The patient's  family history includes Arthritis in her unknown relative; Cancer in her brother; Coronary artery disease in her mother; Depression in her brother; Diabetes in her brother and daughter; Heart disease in her mother; Hypertension in her daughter and unknown relative; Other in her unknown relative; Stroke in her brother and father; Thyroid disease in her daughter.    ROS:  Please see the history of present illness.  Otherwise, review of systems is positive for hearing loss, back pain, insomnia, rash, dizziness.  All other systems are reviewed and negative.     PHYSICAL EXAM: VS:  BP 132/70   Pulse (!) 58   Ht '5\' 3"'  (1.6 m)   Wt 163 lb 12.8 oz (74.3 kg)   SpO2 94%   BMI 29.02 kg/m  , BMI Body mass index is 29.02 kg/m. GEN: Well nourished, well developed, in no acute distress  HEENT: normal  Neck: no JVD, no masses. No carotid bruits Cardiac: RRR without murmur  or gallop                Respiratory:  clear to auscultation bilaterally, normal work of breathing GI: soft, nontender, nondistended, + BS MS: no deformity or atrophy  Ext: no pretibial edema, pedal pulses 2+= bilaterally Skin: warm and dry, no rash Neuro:  Strength and sensation are intact Psych: euthymic mood, full affect  EKG:  EKG is ordered today. The ekg ordered today shows sinus rhythm 58 bpm, right bundle branch block, left anterior fascicular block.  Recent Labs: 05/27/2017: Hemoglobin 13.9; Platelets 153.0; TSH 1.49 06/05/2017: ALT 18; BUN 30; Creatinine, Ser 1.65; Potassium 4.8; Sodium 140   Lipid Panel     Component Value Date/Time   CHOL 155 05/27/2017 1015   TRIG 185.0 (H) 05/27/2017 1015   HDL 59.40 05/27/2017 1015   CHOLHDL 3 05/27/2017 1015   VLDL 37.0 05/27/2017 1015   LDLCALC 58 05/27/2017 1015   LDLDIRECT 73.4 05/20/2014 1651      Wt Readings from Last 3 Encounters:  10/04/17 163 lb 12.8 oz (74.3 kg)  08/05/17 163 lb 6.4 oz (74.1 kg)  05/27/17 159 lb 3.2 oz (72.2 kg)     Cardiac Studies Reviewed: Stress echocardiogram 10/24/2011: Study Conclusions  - Stress ECG conclusions: There were no stress arrhythmias or conduction abnormalities. The stress ECG was negative for ischemia. - Staged echo: There was no echocardiographic evidence for stress-induced ischemia.  ASSESSMENT AND PLAN: 1.  Hypertension with episodic symptomatic hypotension: Reviewed medicines today.  She is on a complex antihypertensive regimen.  She understands that many of her medicines can contribute to insomnia and daytime fatigue.  After discussion of options, we  elected to discontinue triamterene/hydrochlorothiazide and reduce metoprolol succinate by 50%.  She will take 50 mg at bedtime only.  I asked her to record blood pressures about 3 days/week and will arrange follow-up in the hypertension clinic in about 6 weeks.  She will continue on losartan, isosorbide, and amlodipine at current doses.  2.  Coronary artery disease, native vessel, without angina: The patient is stable in this regard.  She continues on aspirin for antiplatelet therapy and low-dose simvastatin for lipid lowering.  3.  Hyperlipidemia: Treated with a statin drug.  Most recent lipids reviewed as above.  LDL cholesterol is at goal.  4.  Bifascicular block: EKG unchanged from last year.  Lightheadedness much more consistent with blood pressure related issues and orthostasis rather than cardiac arrhythmia.  We will reduce her metoprolol by 50% as above  Current medicines are reviewed with the patient today.  The patient does not have concerns regarding medicines.  Labs/ tests ordered today include:  No orders of the defined types were placed in this encounter.   Disposition:   FU as outlined in patient instructions  Signed, [name], MD  10/04/2017 10:31 AM    [facility] [place], [place], [place]  [address] Phone: [phone]; Fax: [phone]

## 2017-10-14 ENCOUNTER — Telehealth: Payer: Self-pay | Admitting: Family Medicine

## 2017-10-14 NOTE — Telephone Encounter (Signed)
Spoke with Heather [name] patient stated that she will give office a call back when she is ready to schedule AWV. SF

## 2017-11-15 ENCOUNTER — Encounter: Payer: Self-pay | Admitting: Pharmacist

## 2017-11-15 ENCOUNTER — Ambulatory Visit (INDEPENDENT_AMBULATORY_CARE_PROVIDER_SITE_OTHER): Payer: Medicare Other | Admitting: Pharmacist

## 2017-11-15 VITALS — BP 140/64 | HR 66

## 2017-11-15 DIAGNOSIS — I2510 Atherosclerotic heart disease of native coronary artery without angina pectoris: Secondary | ICD-10-CM | POA: Diagnosis not present

## 2017-11-15 DIAGNOSIS — I10 Essential (primary) hypertension: Secondary | ICD-10-CM | POA: Diagnosis not present

## 2017-11-15 NOTE — Patient Instructions (Signed)
It was nice to see you today  Continue to take your current medications  Your blood pressure goal is < 140/41mmHg  Call clinic if you start to notice more frequent low blood pressure readings

## 2017-11-15 NOTE — Progress Notes (Signed)
Patient ID: Heather [name]                 DOB: Dec 29, 1934                      MRN: [id number]     HPI: Heather [name] is a 83 y.o. female referred by Dr. Burt [name] to HTN clinic. PMH is significant for CAD s/p PCI in 2006, angina, afib, DM, HLD, TIA, and DVT. At last visit 1 month ago, BP was normal, however pt reported periodic lightheadedness as well as insomnia and daytime fatigue. Her triamterene-HCTZ was discontinued and Toprol dose was reduced by 50%. She presents today for follow up.  Pt presents today in good spirits. Her BP readings have improved from last visit, mostly ranging 120-150/50-70s, HR 60s. She has had 2 low BP readings since her medication changes 5 weeks ago, however the first low reading was 1 day after med changes. This is an improvement from previous episodes of dizziness and hypotension which she was experiencing once a week. No precipitating factors that pt has noted. She has been trying to drink more water. She is still experiencing insomnia and daytime hypersomnolence, however reports she has had this for years. She has been under more stress than usual as her sister-in-law has been living with her and her husband due to illness.  Current HTN meds: amlodipine 16m daily (8-9AM), losartan 1057mdaily (8-9AM), Toprol 5046maily (8-9PM) Previously tried: triamterene-HCTZ BP goal: <140/29m16m  Family History: The patient's  family history includes Arthritis in her unknown relative; Cancer in her brother; Coronary artery disease in her mother; Depression in her brother; Diabetes in her brother and daughter; Heart disease in her mother; Hypertension in her daughter and unknown relative; Other in her unknown relative; Stroke in her brother and father; Thyroid disease in her daughter.   Social History: The patient  reports that she has never smoked. She has never used smokeless tobacco. She reports that she does not drink alcohol or use drugs.   Diet: Breakfast - cereal, decaf  coffee. Lunch - sandwich. Dinner - cooks at home. Likes baked chicken, pork roast, spaghetti, green beans, salad, etc. Does not add salt to food or drink caffeine. Drinking more water.  Exercise: Has a bike and treadmill at home, used them during the winter but has not been too active lately.   Home BP readings: Uses a wrist cuff, has had this for 3 years. Most readings 120-150/50-70s.  Wt Readings from Last 3 Encounters:  10/04/17 163 lb 12.8 oz (74.3 kg)  08/05/17 163 lb 6.4 oz (74.1 kg)  05/27/17 159 lb 3.2 oz (72.2 kg)   BP Readings from Last 3 Encounters:  10/04/17 132/70  08/05/17 140/72  06/05/17 (!) 168/74   Pulse Readings from Last 3 Encounters:  10/04/17 (!) 58  08/05/17 63  06/05/17 66    Renal function: CrCl cannot be calculated (Patient's most recent lab result is older than the maximum 21 days allowed.).  Past Medical History:  Diagnosis Date  . Angina   . Atrial fibrillation (HCC)[place]. Borderline diabetes   . Cancer (HCC)[place] cervical after hysterectomy   . Cataract    removed both eyes  . Coronary artery disease    post percutaneous coronary intervention in 2006  . Depression with anxiety 01/05/2012  . Dermatitis   . Diabetes mellitus without complication (HCC)    no meds  . Diarrhea 04/17/2015  .  Diverticulitis    hx of  . DJD (degenerative joint disease) of knee 09/18/2011   knees- both, elbow- R, ankles   . DVT, lower extremity ([place])    rt. leg after knee surgery  . Dysrhythmia, cardiac    "palpitations"  . Fatigue   . Gastric ulcer    hx of  . GERD (gastroesophageal reflux disease)    prilosec daily  . H/O dizziness    pre-syncope/due to dehydration  . H/O hiatal hernia   . Headache 04/17/2015  . Hip pain 08/28/2011  . Hx of total knee replacement 11/12/11   left knee  . Hyperlipidemia   . Hyperlipidemia associated with type 2 diabetes mellitus ([place]) 05/27/2017  . Hypertension    takes medications daily  . Medicare annual wellness visit,  subsequent 11/22/2013  . Occasional tremors   . Osteoporosis    taking vitamin d  . Persistent disorder of initiating or maintaining sleep   . Restless legs    takes mirapex daily  . S/P inguinal herniorrhaphy   . Stroke [facility]) 2000   TIA- series that have resolved-  . UTI (lower urinary tract infection) 10/04/2013    Current Outpatient Medications on File Prior to Visit  Medication Sig Dispense Refill  . amLODipine (NORVASC) 10 MG tablet TAKE 1/2 TABLET EVERY DAY 45 tablet 0  . aspirin EC 81 MG tablet Take 1 tablet (81 mg total) by mouth daily.    . Blood Glucose Monitoring Suppl (ONE TOUCH ULTRA SYSTEM KIT) W/DEVICE KIT 1 kit by Does not apply route once. DX: 250.00 Check sugars daily as needed 1 each 0  . cholecalciferol (VITAMIN D) 1000 UNITS tablet Take 1,000 Units by mouth daily.    . diclofenac sodium (VOLTAREN) 1 % GEL Apply 2 g topically 3 (three) times daily as needed for pain.    Marland Kitchen glucose blood (ONE TOUCH TEST STRIPS) test strip Check BS daily and prn DX: E11.9 100 each 11  . hyoscyamine (LEVSIN SL) 0.125 MG SL tablet Place 1 tablet (0.125 mg total) under the tongue every 4 (four) hours as needed. 30 tablet 0  . isosorbide mononitrate (IMDUR) 60 MG 24 hr tablet Take 0.5 tablets (30 mg total) by mouth daily. 45 tablet 3  . losartan (COZAAR) 100 MG tablet TAKE 1 TABLET EVERY DAY 90 tablet 0  . magnesium oxide (MAG-OX) 400 MG tablet Take 400 mg by mouth 2 (two) times daily.     . metoprolol succinate (TOPROL-XL) 50 MG 24 hr tablet Take 1 tablet (50 mg total) by mouth at bedtime. 90 tablet 3  . omeprazole (PRILOSEC) 20 MG capsule Take 20 mg by mouth daily.     Heather [name] DELICA LANCETS FINE MISC Check sugars daily and prn  DX 250.00 100 each 1  . potassium chloride SA (KLOR-CON M20) 20 MEQ tablet Take 1 tablet (20 mEq total) by mouth daily. 90 tablet 3  . pramipexole (MIRAPEX) 0.25 MG tablet TAKE 1 TABLET TWICE DAILY 180 tablet 0  . simvastatin (ZOCOR) 20 MG tablet Take 1 tablet  (20 mg total) by mouth at bedtime. 90 tablet 3  . [DISCONTINUED] nitroGLYCERIN (NITROSTAT) 0.4 MG SL tablet Place 0.4 mg under the tongue every 5 (five) minutes as needed.       No current facility-administered medications on file prior to visit.     Allergies  Allergen Reactions  . Nitrofurantoin Rash  . Baclofen Other (See Comments)    Hyperactivity      Assessment/Plan:  1. Hypertension - BP controlled today at goal <140/54mHg. Will continue current meds including Toprol 524mdaily, losartan 10046maily, and amlodipine 5mg38mily as hypotension episodes have decreased, with 1 in the past 5 weeks. Encouraged pt to continue to stay well hydrated and advised her that systolic BP readings in the 140s are ok since she feels symptomatic with systolic BP in the [id number] up in HTN clinic as needed.   Curran Lenderman E. Adi Doro, PharmD, CPP, BCACDeaf [id number]ee[place] [id number]: ([phone]: [phone]/2019 10:39 AM

## 2017-11-21 ENCOUNTER — Encounter: Payer: Self-pay | Admitting: Family Medicine

## 2017-11-21 ENCOUNTER — Ambulatory Visit (INDEPENDENT_AMBULATORY_CARE_PROVIDER_SITE_OTHER): Payer: Medicare Other | Admitting: Family Medicine

## 2017-11-21 DIAGNOSIS — I10 Essential (primary) hypertension: Secondary | ICD-10-CM

## 2017-11-21 DIAGNOSIS — E1169 Type 2 diabetes mellitus with other specified complication: Secondary | ICD-10-CM

## 2017-11-21 DIAGNOSIS — E785 Hyperlipidemia, unspecified: Secondary | ICD-10-CM

## 2017-11-21 DIAGNOSIS — E118 Type 2 diabetes mellitus with unspecified complications: Secondary | ICD-10-CM | POA: Diagnosis not present

## 2017-11-21 DIAGNOSIS — N289 Disorder of kidney and ureter, unspecified: Secondary | ICD-10-CM | POA: Diagnosis not present

## 2017-11-21 DIAGNOSIS — I2510 Atherosclerotic heart disease of native coronary artery without angina pectoris: Secondary | ICD-10-CM | POA: Diagnosis not present

## 2017-11-21 LAB — COMPREHENSIVE METABOLIC PANEL
ALK PHOS: 69 U/L (ref 39–117)
ALT: 15 U/L (ref 0–35)
AST: 17 U/L (ref 0–37)
Albumin: 4 g/dL (ref 3.5–5.2)
BILIRUBIN TOTAL: 0.6 mg/dL (ref 0.2–1.2)
BUN: 22 mg/dL (ref 6–23)
CALCIUM: 9.7 mg/dL (ref 8.4–10.5)
CO2: 28 mEq/L (ref 19–32)
Chloride: 101 mEq/L (ref 96–112)
Creatinine, Ser: 1.07 mg/dL (ref 0.40–1.20)
GFR: 52.29 mL/min — AB (ref >60.00)
Glucose, Bld: 122 mg/dL — ABNORMAL HIGH (ref 70–99)
POTASSIUM: 4.3 meq/L (ref 3.5–5.1)
Sodium: 137 mEq/L (ref 135–145)
TOTAL PROTEIN: 6 g/dL (ref 6.0–8.3)

## 2017-11-21 LAB — LIPID PANEL
CHOLESTEROL: 130 mg/dL (ref 0–200)
HDL: 53 mg/dL (ref >39.00)
LDL Cholesterol: 49 mg/dL (ref 0–99)
NonHDL: 77.36
TRIGLYCERIDES: 140 mg/dL (ref 0.0–149.0)
Total CHOL/HDL Ratio: 2
VLDL: 28 mg/dL (ref 0.0–40.0)

## 2017-11-21 LAB — CBC
HEMATOCRIT: 38 % (ref 36.0–46.0)
HEMOGLOBIN: 12.7 g/dL (ref 12.0–15.0)
MCHC: 33.4 g/dL (ref 30.0–36.0)
MCV: 92.8 fl (ref 78.0–100.0)
PLATELETS: 161 10*3/uL (ref 150.0–400.0)
RBC: 4.09 Mil/uL (ref 3.87–5.11)
RDW: 13 % (ref 11.5–15.5)
WBC: 7 10*3/uL (ref 4.0–10.5)

## 2017-11-21 LAB — TSH: TSH: 1.67 u[IU]/mL (ref 0.35–4.50)

## 2017-11-21 LAB — HEMOGLOBIN A1C: Hgb A1c MFr Bld: 6.7 % — ABNORMAL HIGH (ref 4.6–6.5)

## 2017-11-21 NOTE — Assessment & Plan Note (Signed)
Encouraged heart healthy diet, increase exercise, avoid trans fats, consider a krill oil cap daily 

## 2017-11-21 NOTE — Assessment & Plan Note (Signed)
Asymptomatic and controlling risk factors well

## 2017-11-21 NOTE — Progress Notes (Signed)
Subjective:  I acted as a Education administrator for Dr. [name]. Heather [name], Heather [name]  Patient ID: Heather [name], female    DOB: 15-Jul-1934, 83 y.o.   MRN: [id number]  No chief complaint on file.   HPI  Patient is in today for 6 month follow up on her HTN, hyperlipidemia, DM. She feels well. She denies any recent febrile illness or hospitalizations. No polyuria or polydipsia. Is trying to stay active and minimize carbs. Her BP at home has been wnl. Denies CP/palp/SOB/HA/congestion/fevers/GI or GU c/o. Taking meds as prescribed  Patient Care Team: Mosie Lukes, MD as PCP - General (Family Medicine) Idelle Leech, OD [facility])   Past Medical History:  Diagnosis Date  . Angina   . Atrial fibrillation ([place])   . Borderline diabetes   . Cancer ([place])    cervical after hysterectomy   . Cataract    removed both eyes  . Coronary artery disease    post percutaneous coronary intervention in 2006  . Depression with anxiety 01/05/2012  . Dermatitis   . Diabetes mellitus without complication (HCC)    no meds  . Diarrhea 04/17/2015  . Diverticulitis    hx of  . DJD (degenerative joint disease) of knee 09/18/2011   knees- both, elbow- R, ankles   . DVT, lower extremity ([place])    rt. leg after knee surgery  . Dysrhythmia, cardiac    "palpitations"  . Fatigue   . Gastric ulcer    hx of  . GERD (gastroesophageal reflux disease)    prilosec daily  . H/O dizziness    pre-syncope/due to dehydration  . H/O hiatal hernia   . Headache 04/17/2015  . Hip pain 08/28/2011  . Hx of total knee replacement 11/12/11   left knee  . Hyperlipidemia   . Hyperlipidemia associated with type 2 diabetes mellitus ([place]) 05/27/2017  . Hypertension    takes medications daily  . Medicare annual wellness visit, subsequent 11/22/2013  . Occasional tremors   . Osteoporosis    taking vitamin d  . Persistent disorder of initiating or maintaining sleep   . Restless legs    takes mirapex daily  . S/P inguinal herniorrhaphy   .  Stroke [facility]) 2000   TIA- series that have resolved-  . UTI (lower urinary tract infection) 10/04/2013    Past Surgical History:  Procedure Laterality Date  . ABDOMINAL HYSTERECTOMY    . BRAIN SURGERY     related to stroke   2000 patient unaware states she has not had  . COLONOSCOPY    . CORONARY ANGIOPLASTY  03/2005; 06/2005   "1" plus "1"= "2 total"  . EYE SURGERY  11-16-13   cataract -right eye, left eye summer 2015  . FOOT SURGERY     left foot stress fracture repair  . HERNIA REPAIR     1989- esophageal hernia   . JOINT REPLACEMENT     left  . KNEE ARTHROSCOPY     2 on left and 1 on right  . TOTAL KNEE ARTHROPLASTY  11/12/2011   Procedure: TOTAL KNEE ARTHROPLASTY;  Surgeon: Lorn Junes, MD;  Location: [facility];  Service: Orthopedics;  Laterality: Left;  Dr Noemi Chapel wants 90 minutes for this case  . TOTAL KNEE ARTHROPLASTY Right 11/29/2014   Procedure: RIGHT TOTAL KNEE ARTHROPLASTY;  Surgeon: Vickey Huger, MD;  Location: [facility];  Service: Orthopedics;  Laterality: Right;  . UPPER GASTROINTESTINAL ENDOSCOPY  2013    Family History  Problem Relation Age  of Onset  . Stroke Father        family hx of M 1st degree relative <50  . Coronary artery disease Mother   . Heart disease Mother   . Depression Brother   . Stroke Brother   . Diabetes Brother   . Cancer Brother        bladder with mets  . Diabetes Daughter        borderline  . Hypertension Daughter   . Arthritis Unknown        family hx of  . Hypertension Unknown        family hx of  . Other Unknown        family hx of cardiovascular disorder  . Thyroid disease Daughter   . Breast cancer Neg Hx   . Colon cancer Neg Hx   . Anesthesia problems Neg Hx   . Hypotension Neg Hx   . Malignant hyperthermia Neg Hx   . Pseudochol deficiency Neg Hx   . Colon polyps Neg Hx   . Esophageal cancer Neg Hx   . Rectal cancer Neg Hx   . Stomach cancer Neg Hx     Social History   Socioeconomic History  . Marital status: Married      Spouse name: Not on file  . Number of children: 3  . Years of education: 51  . Highest education level: Not on file  Occupational History  . 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 Financial resource strain: Not on file  . Food insecurity:    Worry: Not on file    Inability: Not on file  . Transportation needs:    Medical: Not on file    Non-medical: Not on file  Tobacco Use  . Smoking status: Never Smoker  . Smokeless tobacco: Never Used  Substance and Sexual Activity  . Alcohol use: No    Alcohol/week: 0.0 oz  . Drug use: No  . Sexual activity: Yes    Birth control/protection: Surgical    Comment: lives with husband, no dietary restrictions  Lifestyle  . Physical activity:    Days per week: Not on file    Minutes per session: Not on file  . Stress: Not on file  Relationships  . Social connections:    Talks on phone: Not on file    Gets together: Not on file    Attends religious service: Not on file    Active member of club or organization: Not on file    Attends meetings of clubs or organizations: Not on file    Relationship status: Not on file  . Intimate partner violence:    Fear of current or ex partner: Not on file    Emotionally abused: Not on file    Physically abused: Not on file    Forced sexual activity: Not on file  Other Topics Concern  . Not on file  Social History Narrative   Lives with husband, Caffeine use- Half Caffeine)- 2 cups daily.  3 children living, one passed away.   Education: HS. Business college.  Retired.     Outpatient Medications Prior to Visit  Medication Sig Dispense Refill  . amLODipine (NORVASC) 10 MG tablet TAKE 1/2 TABLET EVERY DAY 45 tablet 0  . aspirin EC 81 MG tablet Take 1 tablet (81 mg total) by mouth daily.    . Blood Glucose Monitoring Suppl ([place]  KIT) W/DEVICE KIT 1 kit by Does not apply route once. DX: 250.00 Check sugars daily as needed 1  each 0  . cholecalciferol (VITAMIN D) 1000 UNITS tablet Take 1,000 Units by mouth daily.    . diclofenac sodium (VOLTAREN) 1 % GEL Apply 2 g topically 3 (three) times daily as needed for pain.    Marland Kitchen glucose blood (ONE TOUCH TEST STRIPS) test strip Check BS daily and prn DX: E11.9 100 each 11  . hyoscyamine (LEVSIN SL) 0.125 MG SL tablet Place 1 tablet (0.125 mg total) under the tongue every 4 (four) hours as needed. 30 tablet 0  . isosorbide mononitrate (IMDUR) 60 MG 24 hr tablet Take 0.5 tablets (30 mg total) by mouth daily. 45 tablet 3  . losartan (COZAAR) 100 MG tablet TAKE 1 TABLET EVERY DAY 90 tablet 0  . magnesium oxide (MAG-OX) 400 MG tablet Take 400 mg by mouth 2 (two) times daily.     . metoprolol succinate (TOPROL-XL) 50 MG 24 hr tablet Take 1 tablet (50 mg total) by mouth at bedtime. 90 tablet 3  . omeprazole (PRILOSEC) 20 MG capsule Take 20 mg by mouth daily.     Glory Rosebush DELICA LANCETS FINE MISC Check sugars daily and prn  DX 250.00 100 each 1  . potassium chloride SA (KLOR-CON M20) 20 MEQ tablet Take 1 tablet (20 mEq total) by mouth daily. 90 tablet 3  . pramipexole (MIRAPEX) 0.25 MG tablet TAKE 1 TABLET TWICE DAILY 180 tablet 0  . simvastatin (ZOCOR) 20 MG tablet Take 1 tablet (20 mg total) by mouth at bedtime. 90 tablet 3   No facility-administered medications prior to visit.     Allergies  Allergen Reactions  . Nitrofurantoin Rash  . Baclofen Other (See Comments)    Hyperactivity     Review of Systems  Constitutional: Negative for fever and malaise/fatigue.  HENT: Negative for congestion.   Eyes: Negative for blurred vision.  Respiratory: Negative for shortness of breath.   Cardiovascular: Negative for chest pain, palpitations and leg swelling.  Gastrointestinal: Negative for abdominal pain, blood in stool and nausea.  Genitourinary: Negative for dysuria and frequency.  Musculoskeletal: Negative for falls.  Skin: Negative for rash.  Neurological: Negative for  dizziness, loss of consciousness and headaches.  Endo/Heme/Allergies: Negative for environmental allergies.  Psychiatric/Behavioral: Negative for depression. The patient is not nervous/anxious.        Objective:    Physical Exam  Constitutional: She is oriented to person, place, and time. No distress.  HENT:  Head: Normocephalic and atraumatic.  Eyes: Conjunctivae are normal.  Neck: Neck supple. No thyromegaly present.  Cardiovascular: Normal rate, regular rhythm and normal heart sounds.  No murmur heard. Pulmonary/Chest: Effort normal and breath sounds normal. She has no wheezes.  Abdominal: She exhibits no distension and no mass.  Musculoskeletal: She exhibits no edema.  Lymphadenopathy:    She has no cervical adenopathy.  Neurological: She is alert and oriented to person, place, and time.  Skin: Skin is warm and dry. No rash noted. She is not diaphoretic.  Psychiatric: Judgment normal.    BP 138/60 (BP Location: Left Arm, Patient Position: Sitting, Cuff Size: Normal)   Pulse 63   Temp 97.6 F (36.4 C) (Oral)   Resp 18   Wt 160 lb 12.8 oz (72.9 kg)   SpO2 96%   BMI 28.48 kg/m  Wt Readings from Last 3 Encounters:  11/21/17 160 lb 12.8 oz (72.9 kg)  10/04/17 163 lb 12.8  oz (74.3 kg)  08/05/17 163 lb 6.4 oz (74.1 kg)   BP Readings from Last 3 Encounters:  11/21/17 138/60  11/15/17 140/64  10/04/17 132/70     Immunization History  Administered Date(s) Administered  . Influenza Whole 06/08/2005, 06/29/2010  . Influenza, High Dose Seasonal PF 04/05/2015, 06/18/2016, 05/27/2017  . Influenza,inj,Quad PF,6+ Mos 04/30/2014  . Pneumococcal Conjugate-13 11/17/2013  . Pneumococcal Polysaccharide-23 07/09/2000, 05/27/2017  . Tdap 05/24/2015    Health Maintenance  Topic Date Due  . FOOT EXAM  06/18/2017  . HEMOGLOBIN A1C  11/24/2017  . INFLUENZA VACCINE  02/06/2018  . OPHTHALMOLOGY EXAM  02/07/2018  . TETANUS/TDAP  05/23/2025  . DEXA SCAN  Completed  . PNA vac Low  Risk Adult  Completed    Lab Results  Component Value Date   WBC 9.1 05/27/2017   HGB 13.9 05/27/2017   HCT 42.8 05/27/2017   PLT 153.0 05/27/2017   GLUCOSE 152 (H) 06/05/2017   CHOL 155 05/27/2017   TRIG 185.0 (H) 05/27/2017   HDL 59.40 05/27/2017   LDLDIRECT 73.4 05/20/2014   LDLCALC 58 05/27/2017   ALT 18 06/05/2017   AST 16 06/05/2017   NA 140 06/05/2017   K 4.8 06/05/2017   CL 105 06/05/2017   CREATININE 1.65 (H) 06/05/2017   BUN 30 (H) 06/05/2017   CO2 30 06/05/2017   TSH 1.49 05/27/2017   INR 0.98 11/16/2014   HGBA1C 7.5 (H) 05/27/2017    Lab Results  Component Value Date   TSH 1.49 05/27/2017   Lab Results  Component Value Date   WBC 9.1 05/27/2017   HGB 13.9 05/27/2017   HCT 42.8 05/27/2017   MCV 93.8 05/27/2017   PLT 153.0 05/27/2017   Lab Results  Component Value Date   NA 140 06/05/2017   K 4.8 06/05/2017   CO2 30 06/05/2017   GLUCOSE 152 (H) 06/05/2017   BUN 30 (H) 06/05/2017   CREATININE 1.65 (H) 06/05/2017   BILITOT 0.6 06/05/2017   ALKPHOS 76 06/05/2017   AST 16 06/05/2017   ALT 18 06/05/2017   PROT 6.3 06/05/2017   ALBUMIN 3.8 06/05/2017   CALCIUM 9.5 06/05/2017   ANIONGAP 9 10/22/2016   GFR 31.76 (L) 06/05/2017   Lab Results  Component Value Date   CHOL 155 05/27/2017   Lab Results  Component Value Date   HDL 59.40 05/27/2017   Lab Results  Component Value Date   LDLCALC 58 05/27/2017   Lab Results  Component Value Date   TRIG 185.0 (H) 05/27/2017   Lab Results  Component Value Date   CHOLHDL 3 05/27/2017   Lab Results  Component Value Date   HGBA1C 7.5 (H) 05/27/2017         Assessment & Plan:   Problem List Items Addressed This Visit    Essential hypertension    Well controlled, no changes to meds. Encouraged heart healthy diet such as the DASH diet and exercise as tolerated.       Relevant Orders   CBC   Comprehensive metabolic panel   TSH   CORONARY ATHEROSCLEROSIS NATIVE CORONARY ARTERY     Asymptomatic and controlling risk factors well      Renal insufficiency    Hydrating better, check cmp      DM (diabetes mellitus) ([place])    hgba1c acceptable, minimize simple carbs. Increase exercise as tolerated. Continue current meds.      Relevant Orders   Hemoglobin A1c   Hyperlipidemia associated with type 2  diabetes mellitus ([place])    Encouraged heart healthy diet, increase exercise, avoid trans fats, consider a krill oil cap daily      Relevant Orders   Lipid panel      I am having Heather [name] maintain her magnesium oxide, ONE TOUCH ULTRA SYSTEM KIT, ONETOUCH DELICA LANCETS FINE, omeprazole, glucose blood, cholecalciferol, aspirin EC, diclofenac sodium, hyoscyamine, losartan, pramipexole, amLODipine, metoprolol succinate, isosorbide mononitrate, potassium chloride SA, and simvastatin.  No orders of the defined types were placed in this encounter.   CMA served as Education administrator during this visit. History, Physical and Plan performed by medical provider. Documentation and orders reviewed and attested to.  [name], MD

## 2017-11-21 NOTE — Assessment & Plan Note (Signed)
Hydrating better, check cmp

## 2017-11-21 NOTE — Assessment & Plan Note (Signed)
hgba1c acceptable, minimize simple carbs. Increase exercise as tolerated. Continue current meds 

## 2017-11-21 NOTE — Assessment & Plan Note (Signed)
Well controlled, no changes to meds. Encouraged heart healthy diet such as the DASH diet and exercise as tolerated.  °

## 2017-12-07 ENCOUNTER — Other Ambulatory Visit: Payer: Self-pay | Admitting: Family Medicine

## 2018-02-03 ENCOUNTER — Ambulatory Visit: Payer: Medicare Other | Admitting: Diagnostic Neuroimaging

## 2018-02-05 ENCOUNTER — Encounter: Payer: Self-pay | Admitting: Medical

## 2018-02-05 ENCOUNTER — Ambulatory Visit (INDEPENDENT_AMBULATORY_CARE_PROVIDER_SITE_OTHER): Payer: Medicare Other | Admitting: Medical

## 2018-02-05 ENCOUNTER — Other Ambulatory Visit: Payer: Self-pay

## 2018-02-05 ENCOUNTER — Ambulatory Visit: Payer: Self-pay | Admitting: *Deleted

## 2018-02-05 ENCOUNTER — Ambulatory Visit (HOSPITAL_BASED_OUTPATIENT_CLINIC_OR_DEPARTMENT_OTHER)
Admission: RE | Admit: 2018-02-05 | Discharge: 2018-02-05 | Disposition: A | Discharge status: Home / Self Care | Payer: Medicare Other | Source: Ambulatory Visit | Attending: Medical | Admitting: Medical

## 2018-02-05 VITALS — BP 158/85 | HR 58 | Temp 98.0°F | Resp 16 | Ht 63.0 in | Wt 165.4 lb

## 2018-02-05 DIAGNOSIS — M1611 Unilateral primary osteoarthritis, right hip: Secondary | ICD-10-CM | POA: Insufficient documentation

## 2018-02-05 DIAGNOSIS — I2510 Atherosclerotic heart disease of native coronary artery without angina pectoris: Secondary | ICD-10-CM | POA: Diagnosis not present

## 2018-02-05 DIAGNOSIS — M25551 Pain in right hip: Secondary | ICD-10-CM

## 2018-02-05 DIAGNOSIS — S79911A Unspecified injury of right hip, initial encounter: Secondary | ICD-10-CM | POA: Diagnosis not present

## 2018-02-05 MED ORDER — KETOROLAC TROMETHAMINE 60 MG/2ML IM SOLN
30.0000 mg | Freq: Once | INTRAMUSCULAR | Status: AC
  Administered 2018-02-05: 30 mg via INTRAMUSCULAR

## 2018-02-05 NOTE — Progress Notes (Signed)
Subjective:    Patient ID: Heather [name], female    DOB: August 11, 1934, 83 y.o.   MRN: [id number]  HPI  Pt in states she fell 2 weeks ago. She states leg caught in vacuum cleaner. She landed on the vacuum clearner when she fell. Initially not much pain. She had large bruise. Bruise has resolved.   But after about a day pain got worse. Pain not resolving.  Pain is 7-8/10. Worse with movement.   Pt has htn. Recently cut back metoprolol to one tab. Pt also was on triamterine but cut back as well per cardiologist instruction. Pt bp was controlled after changes 2 months ago. Pt thinks bp increase may be due to severe pain and not being able to sleep well due to pain.  Pt tried salon pas patch and did not help much.  Review of Systems  Constitutional: Negative for chills and fatigue.  Respiratory: Negative for cough, chest tightness, shortness of breath and wheezing.   Cardiovascular: Negative for chest pain and palpitations.  Gastrointestinal: Negative for abdominal pain.  Musculoskeletal:       Rt hip pain.  Skin: Negative for rash.  Neurological: Negative for dizziness and headaches.  Hematological: Negative for adenopathy.  Psychiatric/Behavioral: Negative for behavioral problems and confusion.    Past Medical History:  Diagnosis Date  . Angina   . Atrial fibrillation ([place])   . Borderline diabetes   . Cancer ([place])    cervical after hysterectomy   . Cataract    removed both eyes  . Coronary artery disease    post percutaneous coronary intervention in 2006  . Depression with anxiety 01/05/2012  . Dermatitis   . Diabetes mellitus without complication (HCC)    no meds  . Diarrhea 04/17/2015  . Diverticulitis    hx of  . DJD (degenerative joint disease) of knee 09/18/2011   knees- both, elbow- R, ankles   . DVT, lower extremity ([place])    rt. leg after knee surgery  . Dysrhythmia, cardiac    "palpitations"  . Fatigue   . Gastric ulcer    hx of  . GERD (gastroesophageal  reflux disease)    prilosec daily  . H/O dizziness    pre-syncope/due to dehydration  . H/O hiatal hernia   . Headache 04/17/2015  . Hip pain 08/28/2011  . Hx of total knee replacement 11/12/11   left knee  . Hyperlipidemia   . Hyperlipidemia associated with type 2 diabetes mellitus ([place]) 05/27/2017  . Hypertension    takes medications daily  . Medicare annual wellness visit, subsequent 11/22/2013  . Occasional tremors   . Osteoporosis    taking vitamin d  . Persistent disorder of initiating or maintaining sleep   . Restless legs    takes mirapex daily  . S/P inguinal herniorrhaphy   . Stroke [facility]) 2000   TIA- series that have resolved-  . UTI (lower urinary tract infection) 10/04/2013     Social History   Socioeconomic History  . Marital status: Married    Spouse name: Not on file  . Number of children: 3  . Years of education: 29  . Highest education level: Not on file  Occupational History  . 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 Financial resource strain: Not on file  . Food insecurity:    Worry: Not on file    Inability: Not on file  . Transportation needs:  Medical: Not on file    Non-medical: Not on file  Tobacco Use  . Smoking status: Never Smoker  . Smokeless tobacco: Never Used  Substance and Sexual Activity  . Alcohol use: No    Alcohol/week: 0.0 oz  . Drug use: No  . Sexual activity: Yes    Birth control/protection: Surgical    Comment: lives with husband, no dietary restrictions  Lifestyle  . Physical activity:    Days per week: Not on file    Minutes per session: Not on file  . Stress: Not on file  Relationships  . Social connections:    Talks on phone: Not on file    Gets together: Not on file    Attends religious service: Not on file    Active member of club or organization: Not on file    Attends meetings of clubs or organizations: Not on file    Relationship status: Not on file    . Intimate partner violence:    Fear of current or ex partner: Not on file    Emotionally abused: Not on file    Physically abused: Not on file    Forced sexual activity: Not on file  Other Topics Concern  . Not on file  Social History Narrative   Lives with husband, Caffeine use- Half Caffeine)- 2 cups daily.  3 children living, one passed away.   Education: HS. Business college.  Retired.     Past Surgical History:  Procedure Laterality Date  . ABDOMINAL HYSTERECTOMY    . BRAIN SURGERY     related to stroke   2000 patient unaware states she has not had  . COLONOSCOPY    . CORONARY ANGIOPLASTY  03/2005; 06/2005   "1" plus "1"= "2 total"  . EYE SURGERY  11-16-13   cataract -right eye, left eye summer 2015  . FOOT SURGERY     left foot stress fracture repair  . HERNIA REPAIR     1989- esophageal hernia   . JOINT REPLACEMENT     left  . KNEE ARTHROSCOPY     2 on left and 1 on right  . TOTAL KNEE ARTHROPLASTY  11/12/2011   Procedure: TOTAL KNEE ARTHROPLASTY;  Surgeon: Lorn Junes, MD;  Location: [place];  Service: Orthopedics;  Laterality: Left;  Dr Noemi Chapel wants 90 minutes for this case  . TOTAL KNEE ARTHROPLASTY Right 11/29/2014   Procedure: RIGHT TOTAL KNEE ARTHROPLASTY;  Surgeon: Vickey Huger, MD;  Location: [place];  Service: Orthopedics;  Laterality: Right;  . UPPER GASTROINTESTINAL ENDOSCOPY  2013    Family History  Problem Relation Age of Onset  . Stroke Father        family hx of M 1st degree relative <50  . Coronary artery disease Mother   . Heart disease Mother   . Depression Brother   . Stroke Brother   . Diabetes Brother   . Cancer Brother        bladder with mets  . Diabetes Daughter        borderline  . Hypertension Daughter   . Arthritis Unknown        family hx of  . Hypertension Unknown        family hx of  . Other Unknown        family hx of cardiovascular disorder  . Thyroid disease Daughter   . Breast cancer Neg Hx   . Colon cancer Neg Hx   .  Anesthesia problems Neg  Hx   . Hypotension Neg Hx   . Malignant hyperthermia Neg Hx   . Pseudochol deficiency Neg Hx   . Colon polyps Neg Hx   . Esophageal cancer Neg Hx   . Rectal cancer Neg Hx   . Stomach cancer Neg Hx     Allergies  Allergen Reactions  . Nitrofurantoin Rash  . Baclofen Other (See Comments)    Hyperactivity     Current Outpatient Medications on File Prior to Visit  Medication Sig Dispense Refill  . amLODipine (NORVASC) 10 MG tablet TAKE 1/2 TABLET EVERY DAY 45 tablet 0  . aspirin EC 81 MG tablet Take 1 tablet (81 mg total) by mouth daily.    . Blood Glucose Monitoring Suppl (ONE TOUCH ULTRA SYSTEM KIT) W/DEVICE KIT 1 kit by Does not apply route once. DX: 250.00 Check sugars daily as needed 1 each 0  . cholecalciferol (VITAMIN D) 1000 UNITS tablet Take 1,000 Units by mouth daily.    . diclofenac sodium (VOLTAREN) 1 % GEL Apply 2 g topically 3 (three) times daily as needed for pain.    Marland Kitchen glucose blood (ONE TOUCH TEST STRIPS) test strip Check BS daily and prn DX: E11.9 100 each 11  . hyoscyamine (LEVSIN SL) 0.125 MG SL tablet Place 1 tablet (0.125 mg total) under the tongue every 4 (four) hours as needed. 30 tablet 0  . isosorbide mononitrate (IMDUR) 60 MG 24 hr tablet Take 0.5 tablets (30 mg total) by mouth daily. 45 tablet 3  . losartan (COZAAR) 100 MG tablet TAKE 1 TABLET EVERY DAY 90 tablet 0  . magnesium oxide (MAG-OX) 400 MG tablet Take 400 mg by mouth 2 (two) times daily.     . metoprolol succinate (TOPROL-XL) 50 MG 24 hr tablet Take 1 tablet (50 mg total) by mouth at bedtime. 90 tablet 3  . omeprazole (PRILOSEC) 20 MG capsule Take 20 mg by mouth daily.     Glory Rosebush DELICA LANCETS FINE MISC Check sugars daily and prn  DX 250.00 100 each 1  . potassium chloride SA (KLOR-CON M20) 20 MEQ tablet Take 1 tablet (20 mEq total) by mouth daily. 90 tablet 3  . pramipexole (MIRAPEX) 0.25 MG tablet TAKE 1 TABLET TWICE DAILY 180 tablet 0  . simvastatin (ZOCOR) 20 MG  tablet Take 1 tablet (20 mg total) by mouth at bedtime. 90 tablet 3  . [DISCONTINUED] nitroGLYCERIN (NITROSTAT) 0.4 MG SL tablet Place 0.4 mg under the tongue every 5 (five) minutes as needed.       No current facility-administered medications on file prior to visit.     BP (!) 158/85   Pulse (!) 58   Temp 98 F (36.7 C) (Oral)   Resp 16   Ht '5\' 3"'  (1.6 m)   Wt 165 lb 6.4 oz (75 kg)   SpO2 98%   BMI 29.30 kg/m       Objective:   Physical Exam  General- No acute distress. Pleasant patient. Neck- Full range of motion, no jvd Lungs- Clear, even and unlabored. Heart- regular rate and rhythm. Neurologic- CNII- XII grossly intact.  Rt hip- pain on palpation over trochanter moderate to severe. Mild pain on abduction and rotation. Rt thigh- no pain on palpation.  Rt knee- flexion and extension no pain.      Assessment & Plan:  Right hip pain present for 2 weeks, we gave you Toradol 30 mg IM injection.  Starting tomorrow you can use low-dose ibuprofen 200 mg every  8 hours in combination with low-dose Tylenol 3102m.  In addition you can continue these lidocaine over-the-counter patch to the region.  Please get x-ray today.  If you do not respond to treatment given/advised today then would refer you to sports medicine.  Please give [place] update on how you are by Friday morning and then I could try to arrange for you to be seen early next week with sports med.  Your blood pressure is moderately elevated presently.  This might be related to your severe pain and recent not sleeping well.  Continue to check your blood pressure daily  And notify u[place]oreaif bp increases. May need to advise stopping low dose ibuprofen.  Follow up 7 days or a needed  [employer]

## 2018-02-05 NOTE — Telephone Encounter (Signed)
Pt fell 4 weeks ago and is starting to have hip pain. She would like to know what her next step should be. Please advise   Patient fell over/on her vacuum 4 weeks ago. She thought she had bruised her hip- but she is not improving and she is having pain and swelling. Appointment made for evaluation of hip.  Reason for Disposition . [1] SEVERE pain AND [2] not improved 2 hours after pain medicine/ice packs  Answer Assessment - Initial Assessment Questions 1. MECHANISM: "How did the injury happen?" (e.g., twisting injury, direct blow)      [address]- got foot caught in vacuum and landed on vaccum 2. ONSET: "When did the injury happen?" (Minutes or hours ago)      At least 4-5 weeks 3. LOCATION: "Where is the injury located?"      R hip 4. APPEARANCE of INJURY: "What does the injury look like?"  (e.g., deformity of leg)     Patient has swelling at the hip 5. SEVERITY: "Can you put weight on that leg?" "Can you walk?"      Patient can put weight on it- but it is painful- she uses cane/walker for assistance  6. SIZE: For cuts, bruises, or swelling, ask: "How large is it?" (e.g., inches or centimeters;  entire joint)      Bruising is gone now- but swelling is still present- swelling is bigger than palm of hand 7. PAIN: "Is there pain?" If so, ask: "How bad is the pain?"  (e.g., Scale 1-10; or mild, moderate, severe)     Yes- 7-8 8. TETANUS: For any breaks in the skin, ask: "When was the last tetanus booster?"     No breaks in the skin 9. OTHER SYMPTOMS: "Do you have any other symptoms?"      R arm is bothering her some 10. PREGNANCY: "Is there any chance you are pregnant?" "When was your last menstrual period?"       n/a  Protocols used: HIP INJURY-A-AH

## 2018-02-05 NOTE — Patient Instructions (Addendum)
Right hip pain present for 2 weeks, we gave you Toradol 30 mg IM injection.  Starting tomorrow you can use low-dose ibuprofen 200 mg every 8 hours in combination with low-dose Tylenol 325mg .  In addition you can continue these lidocaine over-the-counter patch to the region.  Please get x-ray today.  If you do not respond to treatment given/advised today then would refer you to sports medicine.  Please give [place] an update on how you are by Friday morning and then I could try to arrange for you to be seen early next week with sports med.  Your blood pressure is moderately elevated presently.  This might be related to your severe pain and recent not sleeping well.  Continue to check your blood pressure daily.  And notify us if bp increases. May need to advise stopping low dose ibuprofen.  Also if bp increases ask pt to contact her cardiologist and notify us as well.  Follow up 7 days or a needed

## 2018-02-09 ENCOUNTER — Encounter: Payer: Self-pay | Admitting: Medical

## 2018-02-11 ENCOUNTER — Other Ambulatory Visit: Payer: Self-pay | Admitting: Family Medicine

## 2018-02-11 DIAGNOSIS — M25551 Pain in right hip: Secondary | ICD-10-CM

## 2018-02-11 NOTE — Progress Notes (Signed)
Patient fell last week and after a shot her pain improved some but has now returned. Her husband is in today for his appt and requests her referral to sports med for further consideration. It is placed encouraged to use Lidocaine prn

## 2018-02-13 ENCOUNTER — Encounter: Payer: Self-pay | Admitting: Family Medicine

## 2018-02-13 ENCOUNTER — Ambulatory Visit (INDEPENDENT_AMBULATORY_CARE_PROVIDER_SITE_OTHER): Payer: Medicare Other | Admitting: Family Medicine

## 2018-02-13 DIAGNOSIS — M25551 Pain in right hip: Secondary | ICD-10-CM | POA: Diagnosis not present

## 2018-02-13 DIAGNOSIS — I2510 Atherosclerotic heart disease of native coronary artery without angina pectoris: Secondary | ICD-10-CM

## 2018-02-13 MED ORDER — TRAMADOL HCL 50 MG PO TABS: 50.0000 mg | ORAL_TABLET | Freq: Four times a day (QID) | ORAL | 0 refills | Status: DC | PRN

## 2018-02-13 MED ORDER — METHYLPREDNISOLONE ACETATE 40 MG/ML IJ SUSP
40.0000 mg | Freq: Once | INTRAMUSCULAR | Status: AC
  Administered 2018-02-13: 40 mg via INTRA_ARTICULAR

## 2018-02-13 NOTE — Patient Instructions (Signed)
You have a hip pointer with resultant trochanteric bursitis and IT band syndrome. Avoid painful activities as much as possible. Ice over area of pain 3-4 times a day for 15 minutes at a time Wait a few days before doing the exercises. Standing hip rotations and hip side raise exercise 3 sets of 10 once a day - add weights if this becomes too easy. Stretches - pick 2-3 and hold for 20-30 seconds x 3 - do once or twice a day. Tylenol, voltaren gel as needed. Tramadol as needed for severe pain. You were given a cortisone shot today. Let us know if you want to do physical therapy Follow up with me in 1 month but call me sooner if you're struggling.

## 2018-02-13 NOTE — Progress Notes (Signed)
PCP: Mosie Lukes, MD  Subjective:   HPI: Patient is a 83 y.o. female here for right hip injury.  Patient reports around 7/4 she was vacuuming when she got caught in the vacuum cleaner and fell down onto this then onto ground with her right side. + bruising and swelling lateral right hip. Pain has been sharp, 10/10 level. Cannot lie down onto right side. Tried topical voltaren gel. Pain radiates down to right knee laterally. Tried tylenol with minimal benefit. Also tried salon pas. toradol injection helped the most for about a day. No skin changes besides initial bruising.  No numbness.  Past Medical History:  Diagnosis Date  . Angina   . Atrial fibrillation ([place])   . Borderline diabetes   . Cancer ([place])    cervical after hysterectomy   . Cataract    removed both eyes  . Coronary artery disease    post percutaneous coronary intervention in 2006  . Depression with anxiety 01/05/2012  . Dermatitis   . Diabetes mellitus without complication (HCC)    no meds  . Diarrhea 04/17/2015  . Diverticulitis    hx of  . DJD (degenerative joint disease) of knee 09/18/2011   knees- both, elbow- R, ankles   . DVT, lower extremity ([place])    rt. leg after knee surgery  . Dysrhythmia, cardiac    "palpitations"  . Fatigue   . Gastric ulcer    hx of  . GERD (gastroesophageal reflux disease)    prilosec daily  . H/O dizziness    pre-syncope/due to dehydration  . H/O hiatal hernia   . Headache 04/17/2015  . Hip pain 08/28/2011  . Hx of total knee replacement 11/12/11   left knee  . Hyperlipidemia   . Hyperlipidemia associated with type 2 diabetes mellitus ([place]) 05/27/2017  . Hypertension    takes medications daily  . Medicare annual wellness visit, subsequent 11/22/2013  . Occasional tremors   . Osteoporosis    taking vitamin d  . Persistent disorder of initiating or maintaining sleep   . Restless legs    takes mirapex daily  . S/P inguinal herniorrhaphy   . Stroke [facility]) 2000    TIA- series that have resolved-  . UTI (lower urinary tract infection) 10/04/2013    Current Outpatient Medications on File Prior to Visit  Medication Sig Dispense Refill  . rivaroxaban (XARELTO) 20 MG TABS tablet Xarelto 20 mg tablet    . triamterene-hydrochlorothiazide (MAXZIDE-25) 37.5-25 MG tablet triamterene 37.5 mg-hydrochlorothiazide 25 mg tablet    . amLODipine (NORVASC) 10 MG tablet TAKE 1/2 TABLET EVERY DAY 45 tablet 0  . aspirin EC 81 MG tablet Take 1 tablet (81 mg total) by mouth daily.    . Blood Glucose Monitoring Suppl (ONE TOUCH ULTRA SYSTEM KIT) W/DEVICE KIT 1 kit by Does not apply route once. DX: 250.00 Check sugars daily as needed 1 each 0  . cholecalciferol (VITAMIN D) 1000 UNITS tablet Take 1,000 Units by mouth daily.    . diclofenac sodium (VOLTAREN) 1 % GEL Apply 2 g topically 3 (three) times daily as needed for pain.    . eszopiclone (LUNESTA) 2 MG TABS tablet eszopiclone 2 mg tablet    . fluticasone (FLONASE) 50 MCG/ACT nasal spray fluticasone propionate 50 mcg/actuation nasal spray,suspension    . glucose blood (ONE TOUCH TEST STRIPS) test strip Check BS daily and prn DX: E11.9 100 each 11  . hyoscyamine (LEVSIN SL) 0.125 MG SL tablet Place 1 tablet (0.125 mg  total) under the tongue every 4 (four) hours as needed. 30 tablet 0  . isosorbide mononitrate (IMDUR) 60 MG 24 hr tablet Take 0.5 tablets (30 mg total) by mouth daily. 45 tablet 3  . losartan (COZAAR) 100 MG tablet TAKE 1 TABLET EVERY DAY 90 tablet 0  . magnesium oxide (MAG-OX) 400 MG tablet Take 400 mg by mouth 2 (two) times daily.     . metoprolol succinate (TOPROL-XL) 50 MG 24 hr tablet Take 1 tablet (50 mg total) by mouth at bedtime. 90 tablet 3  . nepafenac (ILEVRO) 0.3 % ophthalmic suspension Ilevro 0.3 % eye drops,suspension    . omeprazole (PRILOSEC) 20 MG capsule Take 20 mg by mouth daily.     Glory Rosebush DELICA LANCETS FINE MISC Check sugars daily and prn  DX 250.00 100 each 1  . potassium chloride SA  (KLOR-CON M20) 20 MEQ tablet Take 1 tablet (20 mEq total) by mouth daily. 90 tablet 3  . pramipexole (MIRAPEX) 0.25 MG tablet TAKE 1 TABLET TWICE DAILY 180 tablet 0  . predniSONE (DELTASONE) 10 MG tablet prednisone 10 mg tablet    . simvastatin (ZOCOR) 20 MG tablet Take 1 tablet (20 mg total) by mouth at bedtime. 90 tablet 3  . traZODone (DESYREL) 50 MG tablet trazodone 50 mg tablet    . triamcinolone cream (KENALOG) 0.1 % triamcinolone acetonide 0.1 % topical cream  APPLY A THIN LAYER TO THE AFFECTED AREA(S) BY TOPICAL ROUTE 2 TIMES PER DAY    . zolpidem (AMBIEN) 10 MG tablet zolpidem 10 mg tablet    . [DISCONTINUED] nitroGLYCERIN (NITROSTAT) 0.4 MG SL tablet Place 0.4 mg under the tongue every 5 (five) minutes as needed.       No current facility-administered medications on file prior to visit.     Past Surgical History:  Procedure Laterality Date  . ABDOMINAL HYSTERECTOMY    . BRAIN SURGERY     related to stroke   2000 patient unaware states she has not had  . COLONOSCOPY    . CORONARY ANGIOPLASTY  03/2005; 06/2005   "1" plus "1"= "2 total"  . EYE SURGERY  11-16-13   cataract -right eye, left eye summer 2015  . FOOT SURGERY     left foot stress fracture repair  . HERNIA REPAIR     1989- esophageal hernia   . JOINT REPLACEMENT     left  . KNEE ARTHROSCOPY     2 on left and 1 on right  . TOTAL KNEE ARTHROPLASTY  11/12/2011   Procedure: TOTAL KNEE ARTHROPLASTY;  Surgeon: Lorn Junes, MD;  Location: [place];  Service: Orthopedics;  Laterality: Left;  Dr Noemi Chapel wants 90 minutes for this case  . TOTAL KNEE ARTHROPLASTY Right 11/29/2014   Procedure: RIGHT TOTAL KNEE ARTHROPLASTY;  Surgeon: Vickey Huger, MD;  Location: [place];  Service: Orthopedics;  Laterality: Right;  . UPPER GASTROINTESTINAL ENDOSCOPY  2013    Allergies  Allergen Reactions  . Nitrofurantoin Rash  . Baclofen Other (See Comments)    Hyperactivity     Social History   Socioeconomic History  . Marital status:  Married    Spouse name: Not on file  . Number of children: 3  . Years of education: 29  . Highest education level: Not on file  Occupational History  . 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 Financial resource strain: Not on file  . Food  insecurity:    Worry: Not on file    Inability: Not on file  . Transportation needs:    Medical: Not on file    Non-medical: Not on file  Tobacco Use  . Smoking status: Never Smoker  . Smokeless tobacco: Never Used  Substance and Sexual Activity  . Alcohol use: No    Alcohol/week: 0.0 standard drinks  . Drug use: No  . Sexual activity: Yes    Birth control/protection: Surgical    Comment: lives with husband, no dietary restrictions  Lifestyle  . Physical activity:    Days per week: Not on file    Minutes per session: Not on file  . Stress: Not on file  Relationships  . Social connections:    Talks on phone: Not on file    Gets together: Not on file    Attends religious service: Not on file    Active member of club or organization: Not on file    Attends meetings of clubs or organizations: Not on file    Relationship status: Not on file  . Intimate partner violence:    Fear of current or ex partner: Not on file    Emotionally abused: Not on file    Physically abused: Not on file    Forced sexual activity: Not on file  Other Topics Concern  . Not on file  Social History Narrative   Lives with husband, Caffeine use- Half Caffeine)- 2 cups daily.  3 children living, one passed away.   Education: HS. Business college.  Retired.     Family History  Problem Relation Age of Onset  . Stroke Father        family hx of M 1st degree relative <50  . Coronary artery disease Mother   . Heart disease Mother   . Depression Brother   . Stroke Brother   . Diabetes Brother   . Cancer Brother        bladder with mets  . Diabetes Daughter        borderline  . Hypertension Daughter   .  Arthritis Unknown        family hx of  . Hypertension Unknown        family hx of  . Other Unknown        family hx of cardiovascular disorder  . Thyroid disease Daughter   . Breast cancer Neg Hx   . Colon cancer Neg Hx   . Anesthesia problems Neg Hx   . Hypotension Neg Hx   . Malignant hyperthermia Neg Hx   . Pseudochol deficiency Neg Hx   . Colon polyps Neg Hx   . Esophageal cancer Neg Hx   . Rectal cancer Neg Hx   . Stomach cancer Neg Hx     BP (!) 171/75   Pulse 61   Ht 5' 3" (1.6 m)   Wt 160 lb (72.6 kg)   BMI 28.34 kg/m   Review of Systems: See HPI above.     Objective:  Physical Exam:  Gen: NAD, comfortable in exam room  Back: No gross deformity, scoliosis. No paraspinal TTP .  No midline or bony TTP. FROM without pain. Strength LEs 5/5 all muscle groups except 4/5 right hip abduction, painful 2+ MSRs in patellar and achilles tendons, equal bilaterally. Negative SLRs. Sensation intact to light touch bilaterally.  Right hip: No deformity, bruising, swelling. FROM with 5/5 strength except 4/5 right hip abduction, painful. TTP over entire IT band, worst  at greater trochanter.  Less tenderness posterior to this. NVI distally. Negative logroll Negative fabers and piriformis stretches.   Assessment & Plan:  1. Right hip injury - independently reviewed radiographs and no evidence fracture.  Consistent with hip pointer with resultant trochanteric bursitis and IT band syndrome.  Icing, tylenol, voltaren gel with tramadol as needed.  Trochanteric bursa injection given today.  Shown home exercises and stretches to do daily starting in a few days.  F/u in 1 month.  After informed written consent timeout was performed, patient was lying on left side on exam table.  Area overlying right trochanteric bursa prepped with alcohol swab then injected with 6:2 bupivicaine: depomedrol.  Patient tolerated procedure well without immediate complications.

## 2018-03-01 ENCOUNTER — Other Ambulatory Visit: Payer: Self-pay | Admitting: Family Medicine

## 2018-03-01 DIAGNOSIS — I10 Essential (primary) hypertension: Secondary | ICD-10-CM

## 2018-03-01 DIAGNOSIS — G2581 Restless legs syndrome: Secondary | ICD-10-CM

## 2018-03-17 ENCOUNTER — Encounter: Payer: Self-pay | Admitting: Family Medicine

## 2018-03-17 ENCOUNTER — Ambulatory Visit (INDEPENDENT_AMBULATORY_CARE_PROVIDER_SITE_OTHER): Payer: Medicare Other | Admitting: Family Medicine

## 2018-03-17 VITALS — BP 141/70 | HR 59 | Ht 63.0 in | Wt 160.0 lb

## 2018-03-17 DIAGNOSIS — M25551 Pain in right hip: Secondary | ICD-10-CM

## 2018-03-17 DIAGNOSIS — I2510 Atherosclerotic heart disease of native coronary artery without angina pectoris: Secondary | ICD-10-CM | POA: Diagnosis not present

## 2018-03-17 NOTE — Patient Instructions (Signed)
You're doing much better. Your hamstring is your primary issue now - do hamstring curls and swings up to 3 sets of 10 once a day. Only add ankle weight if these become too easy. Consider compression sleeve when up and walking around. Call me if you're not improving and would add physical therapy. Follow up with me as needed otherwise.

## 2018-03-18 DIAGNOSIS — Z961 Presence of intraocular lens: Secondary | ICD-10-CM | POA: Diagnosis not present

## 2018-03-18 DIAGNOSIS — H52223 Regular astigmatism, bilateral: Secondary | ICD-10-CM | POA: Diagnosis not present

## 2018-03-18 DIAGNOSIS — H524 Presbyopia: Secondary | ICD-10-CM | POA: Diagnosis not present

## 2018-03-18 DIAGNOSIS — R7309 Other abnormal glucose: Secondary | ICD-10-CM | POA: Diagnosis not present

## 2018-03-18 DIAGNOSIS — H5203 Hypermetropia, bilateral: Secondary | ICD-10-CM | POA: Diagnosis not present

## 2018-03-18 LAB — HM DIABETES EYE EXAM

## 2018-03-20 ENCOUNTER — Encounter: Payer: Self-pay | Admitting: Family Medicine

## 2018-03-20 NOTE — Progress Notes (Signed)
PCP: Mosie Lukes, MD  Subjective:   HPI: Patient is a 83 y.o. female here for right hip injury.  8/8: Patient reports around 7/4 she was vacuuming when she got caught in the vacuum cleaner and fell down onto this then onto ground with her right side. + bruising and swelling lateral right hip. Pain has been sharp, 10/10 level. Cannot lie down onto right side. Tried topical voltaren gel. Pain radiates down to right knee laterally. Tried tylenol with minimal benefit. Also tried salon pas. toradol injection helped the most for about a day. No skin changes besides initial bruising.  No numbness.  9/9: Patient reports she's doing better. Pain down to 3/10 level. Injection helped by the next day. Pain primarily posterior right leg/hip now. Feels a pulling sensation especially going up the steps. Occasionally taking tylenol. Iced some. No skin changes, numbness.  Past Medical History:  Diagnosis Date  . Angina   . Atrial fibrillation ([place])   . Borderline diabetes   . Cancer ([place])    cervical after hysterectomy   . Cataract    removed both eyes  . Coronary artery disease    post percutaneous coronary intervention in 2006  . Depression with anxiety 01/05/2012  . Dermatitis   . Diabetes mellitus without complication ([facility])    no meds  . Diarrhea 04/17/2015  . Diverticulitis    hx of  . DJD (degenerative joint disease) of knee 09/18/2011   knees- both, elbow- R, ankles   . DVT, lower extremity ([place])    rt. leg after knee surgery  . Dysrhythmia, cardiac    "palpitations"  . Fatigue   . Gastric ulcer    hx of  . GERD (gastroesophageal reflux disease)    prilosec daily  . H/O dizziness    pre-syncope/due to dehydration  . H/O hiatal hernia   . Headache 04/17/2015  . Hip pain 08/28/2011  . Hx of total knee replacement 11/12/11   left knee  . Hyperlipidemia   . Hyperlipidemia associated with type 2 diabetes mellitus ([place]) 05/27/2017  . Hypertension    takes medications  daily  . Medicare annual wellness visit, subsequent 11/22/2013  . Occasional tremors   . Osteoporosis    taking vitamin d  . Persistent disorder of initiating or maintaining sleep   . Restless legs    takes mirapex daily  . S/P inguinal herniorrhaphy   . Stroke [facility]) 2000   TIA- series that have resolved-  . UTI (lower urinary tract infection) 10/04/2013    Current Outpatient Medications on File Prior to Visit  Medication Sig Dispense Refill  . amLODipine (NORVASC) 10 MG tablet TAKE 1/2 TABLET EVERY DAY 45 tablet 0  . aspirin EC 81 MG tablet Take 1 tablet (81 mg total) by mouth daily.    . Blood Glucose Monitoring Suppl (ONE TOUCH ULTRA SYSTEM KIT) W/DEVICE KIT 1 kit by Does not apply route once. DX: 250.00 Check sugars daily as needed 1 each 0  . cholecalciferol (VITAMIN D) 1000 UNITS tablet Take 1,000 Units by mouth daily.    . diclofenac sodium (VOLTAREN) 1 % GEL Apply 2 g topically 3 (three) times daily as needed for pain.    . eszopiclone (LUNESTA) 2 MG TABS tablet eszopiclone 2 mg tablet    . fluticasone (FLONASE) 50 MCG/ACT nasal spray fluticasone propionate 50 mcg/actuation nasal spray,suspension    . glucose blood (ONE TOUCH TEST STRIPS) test strip Check BS daily and prn DX: E11.9 100 each 11  .  hyoscyamine (LEVSIN SL) 0.125 MG SL tablet Place 1 tablet (0.125 mg total) under the tongue every 4 (four) hours as needed. 30 tablet 0  . isosorbide mononitrate (IMDUR) 60 MG 24 hr tablet Take 0.5 tablets (30 mg total) by mouth daily. 45 tablet 3  . losartan (COZAAR) 100 MG tablet TAKE 1 TABLET EVERY DAY 90 tablet 0  . magnesium oxide (MAG-OX) 400 MG tablet Take 400 mg by mouth 2 (two) times daily.     . metoprolol succinate (TOPROL-XL) 50 MG 24 hr tablet Take 1 tablet (50 mg total) by mouth at bedtime. 90 tablet 3  . nepafenac (ILEVRO) 0.3 % ophthalmic suspension Ilevro 0.3 % eye drops,suspension    . omeprazole (PRILOSEC) 20 MG capsule Take 20 mg by mouth daily.     Glory Rosebush DELICA  LANCETS FINE MISC Check sugars daily and prn  DX 250.00 100 each 1  . potassium chloride SA (KLOR-CON M20) 20 MEQ tablet Take 1 tablet (20 mEq total) by mouth daily. 90 tablet 3  . pramipexole (MIRAPEX) 0.25 MG tablet TAKE 1 TABLET TWICE DAILY 180 tablet 0  . predniSONE (DELTASONE) 10 MG tablet prednisone 10 mg tablet    . rivaroxaban (XARELTO) 20 MG TABS tablet Xarelto 20 mg tablet    . simvastatin (ZOCOR) 20 MG tablet Take 1 tablet (20 mg total) by mouth at bedtime. 90 tablet 3  . traMADol (ULTRAM) 50 MG tablet Take 1 tablet (50 mg total) by mouth every 6 (six) hours as needed. 20 tablet 0  . traZODone (DESYREL) 50 MG tablet trazodone 50 mg tablet    . triamcinolone cream (KENALOG) 0.1 % triamcinolone acetonide 0.1 % topical cream  APPLY A THIN LAYER TO THE AFFECTED AREA(S) BY TOPICAL ROUTE 2 TIMES PER DAY    . triamterene-hydrochlorothiazide (MAXZIDE-25) 37.5-25 MG tablet triamterene 37.5 mg-hydrochlorothiazide 25 mg tablet    . zolpidem (AMBIEN) 10 MG tablet zolpidem 10 mg tablet    . [DISCONTINUED] nitroGLYCERIN (NITROSTAT) 0.4 MG SL tablet Place 0.4 mg under the tongue every 5 (five) minutes as needed.       No current facility-administered medications on file prior to visit.     Past Surgical History:  Procedure Laterality Date  . ABDOMINAL HYSTERECTOMY    . BRAIN SURGERY     related to stroke   2000 patient unaware states she has not had  . COLONOSCOPY    . CORONARY ANGIOPLASTY  03/2005; 06/2005   "1" plus "1"= "2 total"  . EYE SURGERY  11-16-13   cataract -right eye, left eye summer 2015  . FOOT SURGERY     left foot stress fracture repair  . HERNIA REPAIR     1989- esophageal hernia   . JOINT REPLACEMENT     left  . KNEE ARTHROSCOPY     2 on left and 1 on right  . TOTAL KNEE ARTHROPLASTY  11/12/2011   Procedure: TOTAL KNEE ARTHROPLASTY;  Surgeon: Lorn Junes, MD;  Location: [facility];  Service: Orthopedics;  Laterality: Left;  Dr Noemi Chapel wants 90 minutes for this case  .  TOTAL KNEE ARTHROPLASTY Right 11/29/2014   Procedure: RIGHT TOTAL KNEE ARTHROPLASTY;  Surgeon: Vickey Huger, MD;  Location: [facility];  Service: Orthopedics;  Laterality: Right;  . UPPER GASTROINTESTINAL ENDOSCOPY  2013    Allergies  Allergen Reactions  . Nitrofurantoin Rash  . Baclofen Other (See Comments)    Hyperactivity     Social History   Socioeconomic History  .  Marital status: Married    Spouse name: Not on file  . Number of children: 3  . Years of education: 5  . Highest education level: Not on file  Occupational History  . 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 Financial resource strain: Not on file  . Food insecurity:    Worry: Not on file    Inability: Not on file  . Transportation needs:    Medical: Not on file    Non-medical: Not on file  Tobacco Use  . Smoking status: Never Smoker  . Smokeless tobacco: Never Used  Substance and Sexual Activity  . Alcohol use: No    Alcohol/week: 0.0 standard drinks  . Drug use: No  . Sexual activity: Yes    Birth control/protection: Surgical    Comment: lives with husband, no dietary restrictions  Lifestyle  . Physical activity:    Days per week: Not on file    Minutes per session: Not on file  . Stress: Not on file  Relationships  . Social connections:    Talks on phone: Not on file    Gets together: Not on file    Attends religious service: Not on file    Active member of club or organization: Not on file    Attends meetings of clubs or organizations: Not on file    Relationship status: Not on file  . Intimate partner violence:    Fear of current or ex partner: Not on file    Emotionally abused: Not on file    Physically abused: Not on file    Forced sexual activity: Not on file  Other Topics Concern  . Not on file  Social History Narrative   Lives with husband, Caffeine use- Half Caffeine)- 2 cups daily.  3 children living, one passed away.   Education:  HS. Business college.  Retired.     Family History  Problem Relation Age of Onset  . Stroke Father        family hx of M 1st degree relative <50  . Coronary artery disease Mother   . Heart disease Mother   . Depression Brother   . Stroke Brother   . Diabetes Brother   . Cancer Brother        bladder with mets  . Diabetes Daughter        borderline  . Hypertension Daughter   . Arthritis Unknown        family hx of  . Hypertension Unknown        family hx of  . Other Unknown        family hx of cardiovascular disorder  . Thyroid disease Daughter   . Breast cancer Neg Hx   . Colon cancer Neg Hx   . Anesthesia problems Neg Hx   . Hypotension Neg Hx   . Malignant hyperthermia Neg Hx   . Pseudochol deficiency Neg Hx   . Colon polyps Neg Hx   . Esophageal cancer Neg Hx   . Rectal cancer Neg Hx   . Stomach cancer Neg Hx     BP (!) 141/70   Pulse (!) 59   Ht '5\' 3"'$  (1.6 m)   Wt 160 lb (72.6 kg)   BMI 28.34 kg/m   Review of Systems: See HPI above.     Objective:  Physical Exam:  Gen: NAD, comfortable in exam room  Right hip: No deformity,  swelling, bruising. FROM with 5/5 strength but pain, 4-/5 strength with knee flexion. Minimal tenderness medial hamstring.  No other tenderness. NVI distally. Negative logroll. Negative fabers and piriformis.   Assessment & Plan:  1. Right hip injury - main issue hip pointer with trochanteric bursitis - improved with time, injection.  She's now having hamstring strain.  Shown home exercises to do daily.  Consider compression sleeve.  Ice, tylenol, voltaren gel with tramadol if needed.  F/u prn if doing well.  Consider physical therapy.

## 2018-03-27 ENCOUNTER — Encounter: Payer: Self-pay | Admitting: Family Medicine

## 2018-05-02 ENCOUNTER — Other Ambulatory Visit: Payer: Self-pay | Admitting: Family Medicine

## 2018-05-18 ENCOUNTER — Other Ambulatory Visit: Payer: Self-pay | Admitting: Family Medicine

## 2018-05-18 DIAGNOSIS — I10 Essential (primary) hypertension: Secondary | ICD-10-CM

## 2018-05-18 DIAGNOSIS — G2581 Restless legs syndrome: Secondary | ICD-10-CM

## 2018-05-19 NOTE — Telephone Encounter (Signed)
See refill protocol failure and advise?

## 2018-05-30 ENCOUNTER — Encounter: Payer: Self-pay | Admitting: Family Medicine

## 2018-05-30 ENCOUNTER — Ambulatory Visit (INDEPENDENT_AMBULATORY_CARE_PROVIDER_SITE_OTHER): Payer: Medicare Other | Admitting: Family Medicine

## 2018-05-30 VITALS — BP 140/62 | HR 70 | Temp 98.2°F | Resp 18 | Ht 63.0 in | Wt 162.0 lb

## 2018-05-30 DIAGNOSIS — K219 Gastro-esophageal reflux disease without esophagitis: Secondary | ICD-10-CM

## 2018-05-30 DIAGNOSIS — E1121 Type 2 diabetes mellitus with diabetic nephropathy: Secondary | ICD-10-CM | POA: Diagnosis not present

## 2018-05-30 DIAGNOSIS — I10 Essential (primary) hypertension: Secondary | ICD-10-CM | POA: Diagnosis not present

## 2018-05-30 DIAGNOSIS — N289 Disorder of kidney and ureter, unspecified: Secondary | ICD-10-CM | POA: Diagnosis not present

## 2018-05-30 DIAGNOSIS — Z23 Encounter for immunization: Secondary | ICD-10-CM

## 2018-05-30 DIAGNOSIS — E2839 Other primary ovarian failure: Secondary | ICD-10-CM

## 2018-05-30 DIAGNOSIS — E1169 Type 2 diabetes mellitus with other specified complication: Secondary | ICD-10-CM

## 2018-05-30 DIAGNOSIS — Z0000 Encounter for general adult medical examination without abnormal findings: Secondary | ICD-10-CM

## 2018-05-30 DIAGNOSIS — I2510 Atherosclerotic heart disease of native coronary artery without angina pectoris: Secondary | ICD-10-CM | POA: Diagnosis not present

## 2018-05-30 DIAGNOSIS — E785 Hyperlipidemia, unspecified: Secondary | ICD-10-CM | POA: Diagnosis not present

## 2018-05-30 DIAGNOSIS — G4700 Insomnia, unspecified: Secondary | ICD-10-CM | POA: Diagnosis not present

## 2018-05-30 LAB — CBC
HCT: 40.5 % (ref 36.0–46.0)
Hemoglobin: 13.7 g/dL (ref 12.0–15.0)
MCHC: 33.7 g/dL (ref 30.0–36.0)
MCV: 93 fl (ref 78.0–100.0)
Platelets: 159 10*3/uL (ref 150.0–400.0)
RBC: 4.36 Mil/uL (ref 3.87–5.11)
RDW: 13.1 % (ref 11.5–15.5)
WBC: 6.5 10*3/uL (ref 4.0–10.5)

## 2018-05-30 LAB — COMPREHENSIVE METABOLIC PANEL
ALT: 16 U/L (ref 0–35)
AST: 18 U/L (ref 0–37)
Albumin: 4.3 g/dL (ref 3.5–5.2)
Alkaline Phosphatase: 78 U/L (ref 39–117)
BUN: 23 mg/dL (ref 6–23)
CHLORIDE: 103 meq/L (ref 96–112)
CO2: 30 meq/L (ref 19–32)
CREATININE: 1.01 mg/dL (ref 0.40–1.20)
Calcium: 9.3 mg/dL (ref 8.4–10.5)
GFR: 55.82 mL/min — ABNORMAL LOW (ref >60.00)
Glucose, Bld: 137 mg/dL — ABNORMAL HIGH (ref 70–99)
Potassium: 4.2 mEq/L (ref 3.5–5.1)
SODIUM: 141 meq/L (ref 135–145)
Total Bilirubin: 0.6 mg/dL (ref 0.2–1.2)
Total Protein: 6.2 g/dL (ref 6.0–8.3)

## 2018-05-30 LAB — LIPID PANEL
CHOL/HDL RATIO: 3
Cholesterol: 152 mg/dL (ref 0–200)
HDL: 59.8 mg/dL (ref >39.00)
LDL CALC: 67 mg/dL (ref 0–99)
NONHDL: 91.82
Triglycerides: 125 mg/dL (ref 0.0–149.0)
VLDL: 25 mg/dL (ref 0.0–40.0)

## 2018-05-30 LAB — TSH: TSH: 2.08 u[IU]/mL (ref 0.35–4.50)

## 2018-05-30 LAB — HEMOGLOBIN A1C: HEMOGLOBIN A1C: 7 % — AB (ref 4.6–6.5)

## 2018-05-30 LAB — MICROALBUMIN / CREATININE URINE RATIO
CREATININE, U: 77.3 mg/dL
Microalb Creat Ratio: 0.9 mg/g (ref 0.0–30.0)
Microalb, Ur: <0.7 mg/dL (ref 0.0–1.9)

## 2018-05-30 MED ORDER — FAMOTIDINE 40 MG PO TABS: 40.0000 mg | ORAL_TABLET | Freq: Every day | ORAL | 3 refills | Status: DC

## 2018-05-30 MED ORDER — OMEPRAZOLE 20 MG PO CPDR: 20.0000 mg | DELAYED_RELEASE_CAPSULE | Freq: Every day | ORAL | 1 refills | Status: DC | PRN

## 2018-05-30 NOTE — Assessment & Plan Note (Signed)
Tolerating statin, encouraged heart healthy diet, avoid trans fats, minimize simple carbs and saturated fats. Increase exercise as tolerated 

## 2018-05-30 NOTE — Assessment & Plan Note (Signed)
Patient encouraged to maintain heart healthy diet, regular exercise, adequate sleep. Consider daily probiotics. Take medications as prescribed 

## 2018-05-30 NOTE — Assessment & Plan Note (Signed)
hgba1c acceptable, minimize simple carbs. Increase exercise as tolerated. Continue current meds 

## 2018-05-30 NOTE — Assessment & Plan Note (Addendum)
Avoid offending foods, start probiotics. Do not eat large meals in late evening and consider raising head of bed. Tried to come off of Omeprazole but her reflux flared. Tried to use Pepcid up to 40 mg bid. Try Pepcid 40 mg qhs and Omeprazole qod and increase to qd only as needed

## 2018-05-30 NOTE — Assessment & Plan Note (Signed)
Monitor  And hydrate

## 2018-05-30 NOTE — Assessment & Plan Note (Signed)
Encouraged good sleep hygiene such as dark, quiet room. No blue/green glowing lights such as computer screens in bedroom. No alcohol or stimulants in evening. Cut down on caffeine as able. Regular exercise is helpful but not just prior to bed time.  

## 2018-05-30 NOTE — Patient Instructions (Addendum)
Try Omeprazole drop to every other day  Shingrix is the new shingles shot, 2 shots over 2-6 months at the pharmacy   Preventive Care 65 Years and Older, Female Preventive care refers to lifestyle choices and visits with your health care provider that can promote health and wellness. What does preventive care include?  A yearly physical exam. This is also called an annual well check.  Dental exams once or twice a year.  Routine eye exams. Ask your health care provider how often you should have your eyes checked.  Personal lifestyle choices, including: ? Daily care of your teeth and gums. ? Regular physical activity. ? Eating a healthy diet. ? Avoiding tobacco and drug use. ? Limiting alcohol use. ? Practicing safe sex. ? Taking low-dose aspirin every day. ? Taking vitamin and mineral supplements as recommended by your health care provider. What happens during an annual well check? The services and screenings done by your health care provider during your annual well check will depend on your age, overall health, lifestyle risk factors, and family history of disease. Counseling Your health care provider may ask you questions about your:  Alcohol use.  Tobacco use.  Drug use.  Emotional well-being.  Home and relationship well-being.  Sexual activity.  Eating habits.  History of falls.  Memory and ability to understand (cognition).  Work and work Statistician.  Reproductive health.  Screening You may have the following tests or measurements:  Height, weight, and BMI.  Blood pressure.  Lipid and cholesterol levels. These may be checked every 5 years, or more frequently if you are over 59 years old.  Skin check.  Lung cancer screening. You may have this screening every year starting at age 34 if you have a 30-pack-year history of smoking and currently smoke or have quit within the past 15 years.  Fecal occult blood test (FOBT) of the stool. You may have this  test every year starting at age 56.  Flexible sigmoidoscopy or colonoscopy. You may have a sigmoidoscopy every 5 years or a colonoscopy every 10 years starting at age 19.  Hepatitis C blood test.  Hepatitis B blood test.  Sexually transmitted disease (STD) testing.  Diabetes screening. This is done by checking your blood sugar (glucose) after you have not eaten for a while (fasting). You may have this done every 1-3 years.  Bone density scan. This is done to screen for osteoporosis. You may have this done starting at age 30.  Mammogram. This may be done every 1-2 years. Talk to your health care provider about how often you should have regular mammograms.  Talk with your health care provider about your test results, treatment options, and if necessary, the need for more tests. Vaccines Your health care provider may recommend certain vaccines, such as:  Influenza vaccine. This is recommended every year.  Tetanus, diphtheria, and acellular pertussis (Tdap, Td) vaccine. You may need a Td booster every 10 years.  Varicella vaccine. You may need this if you have not been vaccinated.  Zoster vaccine. You may need this after age 82.  Measles, mumps, and rubella (MMR) vaccine. You may need at least one dose of MMR if you were born in 1957 or later. You may also need a second dose.  Pneumococcal 13-valent conjugate (PCV13) vaccine. One dose is recommended after age 58.  Pneumococcal polysaccharide (PPSV23) vaccine. One dose is recommended after age 65.  Meningococcal vaccine. You may need this if you have certain conditions.  Hepatitis A vaccine.  You may need this if you have certain conditions or if you travel or work in places where you may be exposed to hepatitis A.  Hepatitis B vaccine. You may need this if you have certain conditions or if you travel or work in places where you may be exposed to hepatitis B.  Haemophilus influenzae type b (Hib) vaccine. You may need this if you  have certain conditions.  Talk to your health care provider about which screenings and vaccines you need and how often you need them. This information is not intended to replace advice given to you by your health care provider. Make sure you discuss any questions you have with your health care provider. Document Released: 07/22/2015 Document Revised: 03/14/2016 Document Reviewed: 04/26/2015 Elsevier Interactive Patient Education  [employer].

## 2018-06-01 NOTE — Progress Notes (Signed)
Subjective:    Patient ID: Heather [name], female    DOB: [birth year], 83 y.o.   MRN: [id number]  Chief Complaint  Patient presents with  . Annual Exam    HPI Patient is in today for annual preventative and follow up on chronic medical concerns including diabetes, hypertension, and renal insufficiency. No recent febrile illness or hospitalizations. She notes an improvement in her abdominal pain but still has episodes of right upper quadrant pain although she is unable to see a pattern as to when it occurs. No polyuria or polydipsia. Denies CP/palp/SOB/HA/congestion/fevers/GI or GU c/o. Taking meds as prescribed. She is managing her activities of daily living well. No bowel changes. Denies CP/palp/SOB/HA/congestion/fevers/GI or GU c/o. Taking meds as prescribed  Past Medical History:  Diagnosis Date  . Angina   . Atrial fibrillation ([place])   . Borderline diabetes   . Cancer ([place])    cervical after hysterectomy   . Cataract    removed both eyes  . Coronary artery disease    post percutaneous coronary intervention in 2006  . Depression with anxiety 01/05/2012  . Dermatitis   . Diabetes mellitus without complication ([facility])    no meds  . Diarrhea 04/17/2015  . Diverticulitis    hx of  . DJD (degenerative joint disease) of knee 09/18/2011   knees- both, elbow- R, ankles   . DVT, lower extremity ([place])    rt. leg after knee surgery  . Dysrhythmia, cardiac    "palpitations"  . Fatigue   . Gastric ulcer    hx of  . GERD (gastroesophageal reflux disease)    prilosec daily  . H/O dizziness    pre-syncope/due to dehydration  . H/O hiatal hernia   . Headache 04/17/2015  . Hip pain 08/28/2011  . Hx of total knee replacement 11/12/11   left knee  . Hyperlipidemia   . Hyperlipidemia associated with type 2 diabetes mellitus ([place]) 05/27/2017  . Hypertension    takes medications daily  . Medicare annual wellness visit, subsequent 11/22/2013  . Occasional tremors   . Osteoporosis    taking  vitamin d  . Persistent disorder of initiating or maintaining sleep   . Restless legs    takes mirapex daily  . S/P inguinal herniorrhaphy   . Stroke [facility]) 2000   TIA- series that have resolved-  . UTI (lower urinary tract infection) 10/04/2013    Past Surgical History:  Procedure Laterality Date  . ABDOMINAL HYSTERECTOMY    . BRAIN SURGERY     related to stroke   2000 patient unaware states she has not had  . COLONOSCOPY    . CORONARY ANGIOPLASTY  03/2005; 06/2005   "1" plus "1"= "2 total"  . EYE SURGERY  11-16-13   cataract -right eye, left eye summer 2015  . FOOT SURGERY     left foot stress fracture repair  . HERNIA REPAIR     1989- esophageal hernia   . JOINT REPLACEMENT     left  . KNEE ARTHROSCOPY     2 on left and 1 on right  . TOTAL KNEE ARTHROPLASTY  11/12/2011   Procedure: TOTAL KNEE ARTHROPLASTY;  Surgeon: Lorn Junes, MD;  Location: [place];  Service: Orthopedics;  Laterality: Left;  Dr Noemi Chapel wants 90 minutes for this case  . TOTAL KNEE ARTHROPLASTY Right 11/29/2014   Procedure: RIGHT TOTAL KNEE ARTHROPLASTY;  Surgeon: Vickey Huger, MD;  Location: [place];  Service: Orthopedics;  Laterality: Right;  . UPPER  GASTROINTESTINAL ENDOSCOPY  2013    Family History  Problem Relation Age of Onset  . Stroke Father        family hx of M 1st degree relative <50  . Coronary artery disease Mother   . Heart disease Mother   . Depression Brother   . Stroke Brother   . Diabetes Brother   . Cancer Brother        bladder with mets  . Diabetes Daughter        borderline  . Hypertension Daughter   . Arthritis Unknown        family hx of  . Hypertension Unknown        family hx of  . Other Unknown        family hx of cardiovascular disorder  . Thyroid disease Daughter   . Breast cancer Neg Hx   . Colon cancer Neg Hx   . Anesthesia problems Neg Hx   . Hypotension Neg Hx   . Malignant hyperthermia Neg Hx   . Pseudochol deficiency Neg Hx   . Colon polyps Neg Hx   .  Esophageal cancer Neg Hx   . Rectal cancer Neg Hx   . Stomach cancer Neg Hx     Social History   Socioeconomic History  . Marital status: Married    Spouse name: Not on file  . Number of children: 3  . Years of education: 101  . Highest education level: Not on file  Occupational History  . 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 Financial resource strain: Not on file  . Food insecurity:    Worry: Not on file    Inability: Not on file  . Transportation needs:    Medical: Not on file    Non-medical: Not on file  Tobacco Use  . Smoking status: Never Smoker  . Smokeless tobacco: Never Used  Substance and Sexual Activity  . Alcohol use: No    Alcohol/week: 0.0 standard drinks  . Drug use: No  . Sexual activity: Yes    Birth control/protection: Surgical    Comment: lives with husband, no dietary restrictions  Lifestyle  . Physical activity:    Days per week: Not on file    Minutes per session: Not on file  . Stress: Not on file  Relationships  . Social connections:    Talks on phone: Not on file    Gets together: Not on file    Attends religious service: Not on file    Active member of club or organization: Not on file    Attends meetings of clubs or organizations: Not on file    Relationship status: Not on file  . Intimate partner violence:    Fear of current or ex partner: Not on file    Emotionally abused: Not on file    Physically abused: Not on file    Forced sexual activity: Not on file  Other Topics Concern  . Not on file  Social History Narrative   Lives with husband, Caffeine use- Half Caffeine)- 2 cups daily.  3 children living, one passed away.   Education: HS. Business college.  Retired.     Outpatient Medications Prior to Visit  Medication Sig Dispense Refill  . amLODipine (NORVASC) 10 MG tablet TAKE 1/2 TABLET EVERY DAY 45 tablet 0  . aspirin EC 81 MG tablet Take 1 tablet (81 mg total) by mouth  daily.    . cholecalciferol (VITAMIN D) 1000 UNITS tablet Take 1,000 Units by mouth daily.    . diclofenac sodium (VOLTAREN) 1 % GEL Apply 2 g topically 3 (three) times daily as needed for pain.    . hyoscyamine (LEVSIN SL) 0.125 MG SL tablet Place 1 tablet (0.125 mg total) under the tongue every 4 (four) hours as needed. 30 tablet 0  . isosorbide mononitrate (IMDUR) 60 MG 24 hr tablet Take 0.5 tablets (30 mg total) by mouth daily. 45 tablet 3  . losartan (COZAAR) 100 MG tablet TAKE 1 TABLET EVERY DAY 90 tablet 0  . magnesium oxide (MAG-OX) 400 MG tablet Take 400 mg by mouth 2 (two) times daily.     . metoprolol succinate (TOPROL-XL) 50 MG 24 hr tablet Take 1 tablet (50 mg total) by mouth at bedtime. 90 tablet 3  . nepafenac (ILEVRO) 0.3 % ophthalmic suspension Ilevro 0.3 % eye drops,suspension    . ONETOUCH DELICA LANCETS FINE MISC Check sugars daily and prn  DX 250.00 100 each 1  . potassium chloride SA (KLOR-CON M20) 20 MEQ tablet Take 1 tablet (20 mEq total) by mouth daily. 90 tablet 3  . pramipexole (MIRAPEX) 0.25 MG tablet TAKE 1 TABLET TWICE DAILY 180 tablet 0  . rivaroxaban (XARELTO) 20 MG TABS tablet Xarelto 20 mg tablet    . simvastatin (ZOCOR) 20 MG tablet Take 1 tablet (20 mg total) by mouth at bedtime. 90 tablet 3  . traMADol (ULTRAM) 50 MG tablet Take 1 tablet (50 mg total) by mouth every 6 (six) hours as needed. 20 tablet 0  . traZODone (DESYREL) 50 MG tablet trazodone 50 mg tablet    . triamcinolone cream (KENALOG) 0.1 % triamcinolone acetonide 0.1 % topical cream  APPLY A THIN LAYER TO THE AFFECTED AREA(S) BY TOPICAL ROUTE 2 TIMES PER DAY    . Blood Glucose Monitoring Suppl (ONE TOUCH ULTRA SYSTEM KIT) W/DEVICE KIT 1 kit by Does not apply route once. DX: 250.00 Check sugars daily as needed 1 each 0  . eszopiclone (LUNESTA) 2 MG TABS tablet eszopiclone 2 mg tablet    . fluticasone (FLONASE) 50 MCG/ACT nasal spray fluticasone propionate 50 mcg/actuation nasal spray,suspension      . glucose blood (ONE TOUCH TEST STRIPS) test strip Check BS daily and prn DX: E11.9 100 each 11  . omeprazole (PRILOSEC) 20 MG capsule Take 20 mg by mouth daily.     . predniSONE (DELTASONE) 10 MG tablet prednisone 10 mg tablet    . triamterene-hydrochlorothiazide (MAXZIDE-25) 37.5-25 MG tablet triamterene 37.5 mg-hydrochlorothiazide 25 mg tablet    . zolpidem (AMBIEN) 10 MG tablet zolpidem 10 mg tablet     No facility-administered medications prior to visit.     Allergies  Allergen Reactions  . Nitrofurantoin Rash  . Baclofen Other (See Comments)    Hyperactivity     Review of Systems  Constitutional: Negative for fever and malaise/fatigue.  HENT: Negative for congestion.   Eyes: Negative for blurred vision.  Respiratory: Negative for shortness of breath.   Cardiovascular: Negative for chest pain, palpitations and leg swelling.  Gastrointestinal: Positive for abdominal pain. Negative for blood in stool and nausea.  Genitourinary: Negative for dysuria and frequency.  Musculoskeletal: Negative for falls.  Skin: Negative for rash.  Neurological: Negative for dizziness, loss of consciousness and headaches.  Endo/Heme/Allergies: Negative for environmental allergies.  Psychiatric/Behavioral: Negative for depression. The patient is not nervous/anxious.        Objective:  Physical Exam  Constitutional: She is oriented to person, place, and time. No distress.  HENT:  Head: Normocephalic and atraumatic.  Right Ear: External ear normal.  Left Ear: External ear normal.  Nose: Nose normal.  Mouth/Throat: Oropharynx is clear and moist. No oropharyngeal exudate.  Eyes: Pupils are equal, round, and reactive to light. Conjunctivae are normal. Right eye exhibits no discharge. Left eye exhibits no discharge. No scleral icterus.  Neck: Normal range of motion. Neck supple. No thyromegaly present.  Cardiovascular: Normal rate, regular rhythm, normal heart sounds and intact distal pulses.   No murmur heard. Pulmonary/Chest: Effort normal and breath sounds normal. No respiratory distress. She has no wheezes. She has no rales.  Abdominal: Soft. Bowel sounds are normal. She exhibits no distension and no mass. There is no tenderness.  Musculoskeletal: Normal range of motion. She exhibits no edema or tenderness.  Lymphadenopathy:    She has no cervical adenopathy.  Neurological: She is alert and oriented to person, place, and time. She has normal reflexes. She displays normal reflexes. No cranial nerve deficit. Coordination normal.  Skin: Skin is warm and dry. No rash noted. She is not diaphoretic.    BP 140/62 (BP Location: Left Arm, Patient Position: Sitting, Cuff Size: Normal)   Pulse 70   Temp 98.2 F (36.8 C) (Oral)   Resp 18   Ht _0  (1.6 m)   Wt 162 lb (73.5 kg)   SpO2 96%   BMI 28.70 kg/m  Wt Readings from Last 3 Encounters:  05/30/18 162 lb (73.5 kg)  03/17/18 160 lb (72.6 kg)  02/13/18 160 lb (72.6 kg)     Lab Results  Component Value Date   WBC 6.5 05/30/2018   HGB 13.7 05/30/2018   HCT 40.5 05/30/2018   PLT 159.0 05/30/2018   GLUCOSE 137 (H) 05/30/2018   CHOL 152 05/30/2018   TRIG 125.0 05/30/2018   HDL 59.80 05/30/2018   LDLDIRECT 73.4 05/20/2014   LDLCALC 67 05/30/2018   ALT 16 05/30/2018   AST 18 05/30/2018   NA 141 05/30/2018   K 4.2 05/30/2018   CL 103 05/30/2018   CREATININE 1.01 05/30/2018   BUN 23 05/30/2018   CO2 30 05/30/2018   TSH 2.08 05/30/2018   INR 0.98 11/16/2014   HGBA1C 7.0 (H) 05/30/2018   MICROALBUR <0.7 05/30/2018    Lab Results  Component Value Date   TSH 2.08 05/30/2018   Lab Results  Component Value Date   WBC 6.5 05/30/2018   HGB 13.7 05/30/2018   HCT 40.5 05/30/2018   MCV 93.0 05/30/2018   PLT 159.0 05/30/2018   Lab Results  Component Value Date   NA 141 05/30/2018   K 4.2 05/30/2018   CO2 30 05/30/2018   GLUCOSE 137 (H) 05/30/2018   BUN 23 05/30/2018   CREATININE 1.01 05/30/2018   BILITOT 0.6  05/30/2018   ALKPHOS 78 05/30/2018   AST 18 05/30/2018   ALT 16 05/30/2018   PROT 6.2 05/30/2018   ALBUMIN 4.3 05/30/2018   CALCIUM 9.3 05/30/2018   ANIONGAP 9 10/22/2016   GFR 55.82 (L) 05/30/2018   Lab Results  Component Value Date   CHOL 152 05/30/2018   Lab Results  Component Value Date   HDL 59.80 05/30/2018   Lab Results  Component Value Date   LDLCALC 67 05/30/2018   Lab Results  Component Value Date   TRIG 125.0 05/30/2018   Lab Results  Component Value Date   CHOLHDL 3 05/30/2018  Lab Results  Component Value Date   HGBA1C 7.0 (H) 05/30/2018       Assessment & Plan:   Problem List Items Addressed This Visit    Essential hypertension    Tolerating statin, encouraged heart healthy diet, avoid trans fats, minimize simple carbs and saturated fats. Increase exercise as tolerated      Relevant Orders   Urine Microalbumin w/creat. ratio (Completed)   CBC (Completed)   TSH (Completed)   GERD    Avoid offending foods, start probiotics. Do not eat large meals in late evening and consider raising head of bed. Tried to come off of Omeprazole but her reflux flared. Tried to use Pepcid up to 40 mg bid. Try Pepcid 40 mg qhs and Omeprazole qod and increase to qd only as needed      Relevant Medications   omeprazole (PRILOSEC) 20 MG capsule   famotidine (PEPCID) 40 MG tablet   Renal insufficiency    Monitor  And hydrate      Relevant Orders   Comprehensive metabolic panel (Completed)   DM (diabetes mellitus) ([place])    hgba1c acceptable, minimize simple carbs. Increase exercise as tolerated. Continue current meds      Relevant Orders   Hemoglobin A1c (Completed)   Preventative health care    Patient encouraged to maintain heart healthy diet, regular exercise, adequate sleep. Consider daily probiotics. Take medications as prescribed.       Insomnia    Encouraged good sleep hygiene such as dark, quiet room. No blue/green glowing lights such as computer  screens in bedroom. No alcohol or stimulants in evening. Cut down on caffeine as able. Regular exercise is helpful but not just prior to bed time.       Hyperlipidemia associated with type 2 diabetes mellitus ([place])    Tolerating statin, encouraged heart healthy diet, avoid trans fats, minimize simple carbs and saturated fats. Increase exercise as tolerated      Relevant Orders   Lipid panel (Completed)    Other Visit Diagnoses    Estrogen deficiency    -  Primary   Relevant Orders   DG Bone Density      I have discontinued Koryn M. Barnick's ONE TOUCH ULTRA SYSTEM KIT, glucose blood, zolpidem, triamterene-hydrochlorothiazide, predniSONE, fluticasone, and eszopiclone. I have also changed her omeprazole. Additionally, I am having her start on famotidine. Lastly, I am having her maintain her magnesium oxide, ONETOUCH DELICA LANCETS FINE, cholecalciferol, aspirin EC, diclofenac sodium, hyoscyamine, metoprolol succinate, isosorbide mononitrate, potassium chloride SA, simvastatin, traMADol, triamcinolone cream, traZODone, rivaroxaban, nepafenac, losartan, pramipexole, and amLODipine.  Meds ordered this encounter  Medications  . omeprazole (PRILOSEC) 20 MG capsule    Sig: Take 1 capsule (20 mg total) by mouth daily as needed.    Dispense:  90 capsule    Refill:  1  . famotidine (PEPCID) 40 MG tablet    Sig: Take 1 tablet (40 mg total) by mouth at bedtime.    Dispense:  90 tablet    Refill:  3     [name], MD

## 2018-06-02 ENCOUNTER — Ambulatory Visit (HOSPITAL_BASED_OUTPATIENT_CLINIC_OR_DEPARTMENT_OTHER)
Admission: RE | Admit: 2018-06-02 | Discharge: 2018-06-02 | Disposition: A | Discharge status: Home / Self Care | Payer: Medicare Other | Source: Ambulatory Visit | Attending: Family Medicine | Admitting: Family Medicine

## 2018-06-02 DIAGNOSIS — M85851 Other specified disorders of bone density and structure, right thigh: Secondary | ICD-10-CM | POA: Diagnosis not present

## 2018-06-02 DIAGNOSIS — E2839 Other primary ovarian failure: Secondary | ICD-10-CM

## 2018-07-18 NOTE — Progress Notes (Addendum)
Subjective:   Heather [name] is a 84 y.o. female who presents for Medicare Annual (Subsequent) preventive examination.  Pt enjoys sewing.   Review of Systems: No ROS.  Medicare Wellness Visit. Additional risk factors are reflected in the social history. Cardiac Risk Factors include: advanced age (>18men, >86 women);diabetes mellitus;dyslipidemia;hypertension;sedentary lifestyle Sleep patterns: pt states she has trouble turning her mind off. Home Safety/Smoke Alarms: Feels safe in home. Smoke alarms in place.  Lives with husband in one story home.    Female:        Mammo-05/28/17 . Declines.    Dexa scan- 06/02/18  osteopenia     Objective:     Vitals: BP 140/72 (BP Location: Left Arm, Patient Position: Sitting, Cuff Size: Normal)   Pulse 61   Ht 5\' 3"  (1.6 m)   Wt 165 lb 12.8 oz (75.2 kg)   SpO2 97%   BMI 29.37 kg/m   Body mass index is 29.37 kg/m.  Advanced Directives 07/21/2018 04/18/2017 06/18/2016 07/01/2015 05/03/2015 01/18/2015 11/16/2014  Does Patient Have a Medical Advance Directive? Yes Yes Yes No Yes Yes Yes  Type of Paramedic of [place];Living will [place];Living will [place];Living will - [place];Living will [place];Living will [place];Living will  Does patient want to make changes to medical advance directive? No - Patient declined - No - Patient declined - - - -  Copy of [place] in Chart? No - copy requested - No - copy requested - - Yes Yes  Would patient like information on creating a medical advance directive? - - - No - patient declined information - - -  Pre-existing out of facility DNR order (yellow form or pink MOST form) - - - - - - -    Tobacco Social History   Tobacco Use  Smoking Status Never Smoker  Smokeless Tobacco Never Used     Counseling given: Not Answered   Clinical Intake: Pain :  No/denies pain    Past Medical History:  Diagnosis Date  . Angina   . Atrial fibrillation ([place])   . Borderline diabetes   . Cancer ([place])    cervical after hysterectomy   . Cataract    removed both eyes  . Coronary artery disease    post percutaneous coronary intervention in 2006  . Depression with anxiety 01/05/2012  . Dermatitis   . Diabetes mellitus without complication ([facility])    no meds  . Diarrhea 04/17/2015  . Diverticulitis    hx of  . DJD (degenerative joint disease) of knee 09/18/2011   knees- both, elbow- R, ankles   . DVT, lower extremity ([place])    rt. leg after knee surgery  . Dysrhythmia, cardiac    "palpitations"  . Fatigue   . Gastric ulcer    hx of  . GERD (gastroesophageal reflux disease)    prilosec daily  . H/O dizziness    pre-syncope/due to dehydration  . H/O hiatal hernia   . Headache 04/17/2015  . Hip pain 08/28/2011  . Hx of total knee replacement 11/12/11   left knee  . Hyperlipidemia   . Hyperlipidemia associated with type 2 diabetes mellitus ([place]) 05/27/2017  . Hypertension    takes medications daily  . Medicare annual wellness visit, subsequent 11/22/2013  . Occasional tremors   . Osteoporosis    taking vitamin d  . Persistent disorder of initiating or maintaining sleep   .  Restless legs    takes mirapex daily  . S/P inguinal herniorrhaphy   . Stroke [facility]) 2000   TIA- series that have resolved-  . UTI (lower urinary tract infection) 10/04/2013   Past Surgical History:  Procedure Laterality Date  . ABDOMINAL HYSTERECTOMY    . BRAIN SURGERY     related to stroke   2000 patient unaware states she has not had  . COLONOSCOPY    . CORONARY ANGIOPLASTY  03/2005; 06/2005   "1" plus "1"= "2 total"  . EYE SURGERY  11-16-13   cataract -right eye, left eye summer 2015  . FOOT SURGERY     left foot stress fracture repair  . HERNIA REPAIR     1989- esophageal hernia   . JOINT REPLACEMENT     left  . KNEE ARTHROSCOPY     2 on left and 1 on right   . TOTAL KNEE ARTHROPLASTY  11/12/2011   Procedure: TOTAL KNEE ARTHROPLASTY;  Surgeon: Lorn Junes, MD;  Location: [facility];  Service: Orthopedics;  Laterality: Left;  Dr Noemi Chapel wants 90 minutes for this case  . TOTAL KNEE ARTHROPLASTY Right 11/29/2014   Procedure: RIGHT TOTAL KNEE ARTHROPLASTY;  Surgeon: Vickey Huger, MD;  Location: [facility];  Service: Orthopedics;  Laterality: Right;  . UPPER GASTROINTESTINAL ENDOSCOPY  2013   Family History  Problem Relation Age of Onset  . Stroke Father        family hx of M 1st degree relative <50  . Coronary artery disease Mother   . Heart disease Mother   . Depression Brother   . Stroke Brother   . Diabetes Brother   . Cancer Brother        bladder with mets  . Diabetes Daughter        borderline  . Hypertension Daughter   . Arthritis Other        family hx of  . Hypertension Other        family hx of  . Other Other        family hx of cardiovascular disorder  . Thyroid disease Daughter   . Breast cancer Neg Hx   . Colon cancer Neg Hx   . Anesthesia problems Neg Hx   . Hypotension Neg Hx   . Malignant hyperthermia Neg Hx   . Pseudochol deficiency Neg Hx   . Colon polyps Neg Hx   . Esophageal cancer Neg Hx   . Rectal cancer Neg Hx   . Stomach cancer Neg Hx    Social History   Socioeconomic History  . Marital status: Married    Spouse name: Not on file  . Number of children: 3  . Years of education: 69  . Highest education level: Not on file  Occupational History  . 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 Financial resource strain: Not on file  . Food insecurity:    Worry: Not on file    Inability: Not on file  . Transportation needs:    Medical: Not on file    Non-medical: Not on file  Tobacco Use  . Smoking status: Never Smoker  . Smokeless tobacco: Never Used  Substance and Sexual Activity  . Alcohol use: No    Alcohol/week: 0.0 standard drinks  . Drug use: No  .  Sexual activity: Yes    Birth control/protection: Surgical    Comment: lives with husband, no  dietary restrictions  Lifestyle  . Physical activity:    Days per week: Not on file    Minutes per session: Not on file  . Stress: Not on file  Relationships  . Social connections:    Talks on phone: Not on file    Gets together: Not on file    Attends religious service: Not on file    Active member of club or organization: Not on file    Attends meetings of clubs or organizations: Not on file    Relationship status: Not on file  Other Topics Concern  . Not on file  Social History Narrative   Lives with husband, Caffeine use- Half Caffeine)- 2 cups daily.  3 children living, one passed away.   Education: HS. Business college.  Retired.     Outpatient Encounter Medications as of 07/21/2018  Medication Sig  . amLODipine (NORVASC) 10 MG tablet TAKE 1/2 TABLET EVERY DAY  . aspirin EC 81 MG tablet Take 1 tablet (81 mg total) by mouth daily.  . cholecalciferol (VITAMIN D) 1000 UNITS tablet Take 1,000 Units by mouth daily.  . diclofenac sodium (VOLTAREN) 1 % GEL Apply 2 g topically 3 (three) times daily as needed for pain.  . famotidine (PEPCID) 40 MG tablet Take 1 tablet (40 mg total) by mouth at bedtime.  . hyoscyamine (LEVSIN SL) 0.125 MG SL tablet Place 1 tablet (0.125 mg total) under the tongue every 4 (four) hours as needed.  . isosorbide mononitrate (IMDUR) 60 MG 24 hr tablet Take 0.5 tablets (30 mg total) by mouth daily.  Marland Kitchen losartan (COZAAR) 100 MG tablet TAKE 1 TABLET EVERY DAY  . magnesium oxide (MAG-OX) 400 MG tablet Take 400 mg by mouth 2 (two) times daily.   . metoprolol succinate (TOPROL-XL) 50 MG 24 hr tablet Take 1 tablet (50 mg total) by mouth at bedtime.  Marland Kitchen omeprazole (PRILOSEC) 20 MG capsule Take 1 capsule (20 mg total) by mouth daily as needed.  . potassium chloride SA (KLOR-CON M20) 20 MEQ tablet Take 1 tablet (20 mEq total) by mouth daily.  . pramipexole (MIRAPEX) 0.25 MG  tablet TAKE 1 TABLET TWICE DAILY  . simvastatin (ZOCOR) 20 MG tablet Take 1 tablet (20 mg total) by mouth at bedtime.  . triamcinolone cream (KENALOG) 0.1 % triamcinolone acetonide 0.1 % topical cream  APPLY A THIN LAYER TO THE AFFECTED AREA(S) BY TOPICAL ROUTE 2 TIMES PER DAY  . ONETOUCH DELICA LANCETS FINE MISC Check sugars daily and prn  DX 250.00 (Patient not taking: Reported on 07/21/2018)  . [DISCONTINUED] nepafenac (ILEVRO) 0.3 % ophthalmic suspension Ilevro 0.3 % eye drops,suspension  . [DISCONTINUED] nitroGLYCERIN (NITROSTAT) 0.4 MG SL tablet Place 0.4 mg under the tongue every 5 (five) minutes as needed.    . [DISCONTINUED] rivaroxaban (XARELTO) 20 MG TABS tablet Xarelto 20 mg tablet  . [DISCONTINUED] traMADol (ULTRAM) 50 MG tablet Take 1 tablet (50 mg total) by mouth every 6 (six) hours as needed.  . [DISCONTINUED] traZODone (DESYREL) 50 MG tablet trazodone 50 mg tablet   No facility-administered encounter medications on file as of 07/21/2018.     Activities of Daily Living In your present state of health, do you have any difficulty performing the following activities: 07/21/2018  Hearing? N  Vision? N  Comment wears glasses for reading.   Difficulty concentrating or making decisions? N  Walking or climbing stairs? N  Dressing or bathing? N  Doing errands, shopping? N  Preparing Food and eating ? N  Using the Toilet? N  In the past six months, have you accidently leaked urine? Y  Comment wears pads at night.  Do you have problems with loss of bowel control? N  Managing your Medications? N  Managing your Finances? N  Housekeeping or managing your Housekeeping? N  Some recent data might be hidden    Patient Care Team: Mosie Lukes, MD as PCP - General (Family Medicine) Idelle Leech, OD [facility])    Assessment:   This is a routine wellness examination for Gizelle. Physical assessment deferred to PCP.  Exercise Activities and Dietary recommendations Current  Exercise Habits: The patient does not participate in regular exercise at present, Exercise limited by: None identified   Diet (meal preparation, eat out, water intake, caffeinated beverages, dairy products, fruits and vegetables): 24 hour recall Breakfast: cereal Lunch: sandwich Dinner:  soup Pt reports she needs to drink more water.  Goals    . Increase physical activity     Begin using exercise bike 3x/wk. 1 mile each time.    . Increase water intake    . Weight (lb) < 150 lb (68 kg)       Fall Risk Fall Risk  07/21/2018 02/05/2018 02/05/2018 06/18/2016 03/05/2016  Falls in the past year? 1 Yes Yes No No  Comment - - Emmi Telephone Survey: data to providers prior to load - Emmi Telephone Survey: data to providers prior to load  Number falls in past yr: 0 1 2 or more - -  Comment - - Emmi Telephone Survey Actual Response = 3 - -  Injury with Fall? 1 - Yes - -  Risk for fall due to : - - - - -  Risk for fall due to: Comment - - - - -     Depression Screen PHQ 2/9 Scores 07/21/2018 06/18/2016 12/22/2014 11/17/2013  PHQ - 2 Score 1 0 2 0  PHQ- 9 Score - - 8 -     Cognitive Function Ad8 score reviewed for issues:  Issues making decisions:no  Less interest in hobbies / activities:no  Repeats questions, stories (family complaining):no  Trouble using ordinary gadgets (microwave, computer, phone):no  Forgets the month or year: no  Mismanaging finances: no  Remembering appts:no  Daily problems with thinking and/or memory:no Ad8 score is=0  MMSE - Mini Mental State Exam 06/18/2016  Orientation to time 5  Orientation to Place 5  Registration 3  Attention/ Calculation 5  Recall 2  Language- name 2 objects 2  Language- repeat 1  Language- follow 3 step command 3  Language- read & follow direction 1  Write a sentence 1  Copy design 1  Total score 29        Immunization History  Administered Date(s) Administered  . Influenza Whole 06/08/2005, 06/29/2010  .  Influenza, High Dose Seasonal PF 04/05/2015, 06/18/2016, 05/27/2017, 05/30/2018  . Influenza,inj,Quad PF,6+ Mos 04/30/2014  . Pneumococcal Conjugate-13 11/17/2013  . Pneumococcal Polysaccharide-23 07/09/2000, 05/27/2017  . Tdap 05/24/2015    Screening Tests Health Maintenance  Topic Date Due  . FOOT EXAM  06/18/2017  . HEMOGLOBIN A1C  11/28/2018  . OPHTHALMOLOGY EXAM  03/19/2019  . TETANUS/TDAP  05/23/2025  . INFLUENZA VACCINE  Completed  . DEXA SCAN  Completed  . PNA vac Low Risk Adult  Completed      Plan:    Please schedule your next medicare wellness visit with me in 1 yr.  Eat a healthy diet and please increase your water intake.  Increase exercise.  Bring a copy of your living will and/or healthcare power of attorney to your next office visit.    I have personally reviewed and noted the following in the patient's chart:   . Medical and social history . Use of alcohol, tobacco or illicit drugs  . Current medications and supplements . Functional ability and status . Nutritional status . Physical activity . Advanced directives . List of other physicians . Hospitalizations, surgeries, and ER visits in previous 12 months . Vitals . Screenings to include cognitive, depression, and falls . Referrals and appointments  In addition, I have reviewed and discussed with patient certain preventive protocols, quality metrics, and best practice recommendations. A written personalized care plan for preventive services as well as general preventive health recommendations were provided to patient.     Shela Nevin, [place]  07/21/2018   Medical screening examination/treatment was performed by qualified clinical staff member and as supervising physician I was immediately available for consultation/collaboration. I have reviewed documentation and agree with assessment and plan.  [name], MD

## 2018-07-21 ENCOUNTER — Ambulatory Visit (INDEPENDENT_AMBULATORY_CARE_PROVIDER_SITE_OTHER): Payer: Medicare HMO | Admitting: *Deleted

## 2018-07-21 ENCOUNTER — Encounter: Payer: Self-pay | Admitting: *Deleted

## 2018-07-21 VITALS — BP 140/72 | HR 61 | Ht 63.0 in | Wt 165.8 lb

## 2018-07-21 DIAGNOSIS — Z0000 Encounter for general adult medical examination without abnormal findings: Secondary | ICD-10-CM | POA: Diagnosis not present

## 2018-07-21 NOTE — Patient Instructions (Signed)
Please schedule your next medicare wellness visit with me in 1 yr.  Eat a healthy diet and please increase your water intake.   Increase exercise.  Bring a copy of your living will and/or healthcare power of attorney to your next office visit.   Heather [name] , Thank you for taking time to come for your Medicare Wellness Visit. I appreciate your ongoing commitment to your health goals. Please review the following plan we discussed and let me know if I can assist you in the future.   These are the goals we discussed: Goals    . Increase physical activity     Begin using exercise bike 3x/wk. 1 mile each time.    . Increase water intake    . Weight (lb) < 150 lb (68 kg)       This is a list of the screening recommended for you and due dates:  Health Maintenance  Topic Date Due  . Complete foot exam   06/18/2017  . Hemoglobin A1C  11/28/2018  . Eye exam for diabetics  03/19/2019  . Tetanus Vaccine  05/23/2025  . Flu Shot  Completed  . DEXA scan (bone density measurement)  Completed  . Pneumonia vaccines  Completed    Health Maintenance After Age 21 After age 67, you are at a higher risk for certain long-term diseases and infections as well as injuries from falls. Falls are a major cause of broken bones and head injuries in people who are older than age 69. Getting regular preventive care can help to keep you healthy and well. Preventive care includes getting regular testing and making lifestyle changes as recommended by your health care provider. Talk with your health care provider about:  Which screenings and tests you should have. A screening is a test that checks for a disease when you have no symptoms.  A diet and exercise plan that is right for you. What should I know about screenings and tests to prevent falls? Screening and testing are the best ways to find a health problem early. Early diagnosis and treatment give you the best chance of managing medical conditions that are  common after age 79. Certain conditions and lifestyle choices may make you more likely to have a fall. Your health care provider may recommend:  Regular vision checks. Poor vision and conditions such as cataracts can make you more likely to have a fall. If you wear glasses, make sure to get your prescription updated if your vision changes.  Medicine review. Work with your health care provider to regularly review all of the medicines you are taking, including over-the-counter medicines. Ask your health care provider about any side effects that may make you more likely to have a fall. Tell your health care provider if any medicines that you take make you feel dizzy or sleepy.  Osteoporosis screening. Osteoporosis is a condition that causes the bones to get weaker. This can make the bones weak and cause them to break more easily.  Blood pressure screening. Blood pressure changes and medicines to control blood pressure can make you feel dizzy.  Strength and balance checks. Your health care provider may recommend certain tests to check your strength and balance while standing, walking, or changing positions.  Foot health exam. Foot pain and numbness, as well as not wearing proper footwear, can make you more likely to have a fall.  Depression screening. You may be more likely to have a fall if you have a fear of falling,  feel emotionally low, or feel unable to do activities that you used to do.  Alcohol use screening. Using too much alcohol can affect your balance and may make you more likely to have a fall. What actions can I take to lower my risk of falls? General instructions  Talk with your health care provider about your risks for falling. Tell your health care provider if: ? You fall. Be sure to tell your health care provider about all falls, even ones that seem minor. ? You feel dizzy, sleepy, or off-balance.  Take over-the-counter and prescription medicines only as told by your health care  provider. These include any supplements.  Eat a healthy diet and maintain a healthy weight. A healthy diet includes low-fat dairy products, low-fat (lean) meats, and fiber from whole grains, beans, and lots of fruits and vegetables. Home safety  Remove any tripping hazards, such as rugs, cords, and clutter.  Install safety equipment such as grab bars in bathrooms and safety rails on stairs.  Keep rooms and walkways well-lit. Activity   Follow a regular exercise program to stay fit. This will help you maintain your balance. Ask your health care provider what types of exercise are appropriate for you.  If you need a cane or walker, use it as recommended by your health care provider.  Wear supportive shoes that have nonskid soles. Lifestyle  Do not drink alcohol if your health care provider tells you not to drink.  If you drink alcohol, limit how much you have: ? 0-1 drink a day for women. ? 0-2 drinks a day for men.  Be aware of how much alcohol is in your drink. In the [place] one drink equals one typical bottle of beer (12 oz), one-half glass of wine (5 oz), or one shot of hard liquor (1 oz).  Do not use any products that contain nicotine or tobacco, such as cigarettes and e-cigarettes. If you need help quitting, ask your health care provider. Summary  Having a healthy lifestyle and getting preventive care can help to protect your health and wellness after age 63.  Screening and testing are the best way to find a health problem early and help you avoid having a fall. Early diagnosis and treatment give you the best chance for managing medical conditions that are more common for people who are older than age 16.  Falls are a major cause of broken bones and head injuries in people who are older than age 83. Take precautions to prevent a fall at home.  Work with your health care provider to learn what changes you can make to improve your health and wellness and to prevent falls. This  information is not intended to replace advice given to you by your health care provider. Make sure you discuss any questions you have with your health care provider. Document Released: 05/08/2017 Document Revised: 05/08/2017 Document Reviewed: 05/08/2017 Elsevier Interactive Patient Education  2019 [employer].

## 2018-08-02 ENCOUNTER — Other Ambulatory Visit: Payer: Self-pay | Admitting: Family Medicine

## 2018-08-02 ENCOUNTER — Other Ambulatory Visit: Payer: Self-pay | Admitting: Cardiovascular Disease

## 2018-08-02 DIAGNOSIS — I10 Essential (primary) hypertension: Secondary | ICD-10-CM

## 2018-09-04 ENCOUNTER — Encounter: Payer: Self-pay | Admitting: Family Medicine

## 2018-09-04 ENCOUNTER — Ambulatory Visit: Payer: Medicare HMO | Admitting: Family Medicine

## 2018-09-04 VITALS — BP 160/63 | Ht 63.0 in | Wt 162.0 lb

## 2018-09-04 DIAGNOSIS — M25551 Pain in right hip: Secondary | ICD-10-CM

## 2018-09-04 DIAGNOSIS — R200 Anesthesia of skin: Secondary | ICD-10-CM | POA: Diagnosis not present

## 2018-09-04 NOTE — Patient Instructions (Signed)
Let us know early to middle of next week how the numbness is - it should improve with time and resolve. Your exam is otherwise reassuring for this. Voltaren gel, lidocaine patches for the bursitis. Do the home exercises and stretches as well for this. Consider injection if not improving. Follow up with me as needed if you're doing well.

## 2018-09-04 NOTE — Progress Notes (Signed)
Subjective:     Patient ID: Heather [name], female   DOB: Feb 17, 1935, 84 y.o.   MRN: [id number]  HPI Heather [name] is an 84yo with PMHx of R trochanteric bursitis who presents with numbness of the R leg for 1 day. She reports that yesterday afternoon, with no inciting event that she could remember, she noticed that her R leg was numb. She thought the numbness affected the entire limb. There was no pain associated with this in her leg or her back, and she has not noticed any difficulty or change in her walking. The numbness today is a little bit worse over the lower leg than it was yesterday, and improved over the upper leg as compared to yesterday. She has not had any fevers, weight loss, bowel or bladder dysfunction. She did, however, note worsening pain in her hip. Since her last visit, her hip pain had improved significantly to where she was able to sleep on her R side over the last couple of months, but as of yesterday the pain was bad again. She did not do anything differently yesterday that may have provoked the renewed pain or the numbness in her leg. She last went on a long car ride about 2-3 weeks ago to the [place] weekend she helped someone move and was lifting more than she usually does. She does not usually wear belts but she does often wear jeans.  No bowel or bladder dysfunction.  She does note that at her annual wellness visit in January, she reported to her physician that she had been feeling a vibration sensation in the bottom of her R heel. There was no pain with this, and it went away within a few days.  Review of Systems As above.    Objective:   Physical Exam  Gen: NAD, comfortable in exam room VS reviewed.  R leg: no visible deformities or skin changes in the hip or leg; tenderness to palpation noted over the greater trochanter and the SI joint, no tenderness to palpation over lower spine or elsewhere around the hip; full ROM of the hip with abduction, internal rotation, and  external rotation; full ROM of the knee in flexion and extension; full ROM of the ankle in dorsoflexion, plantarflexion, medial deviation and lateral deviation; full ROM of lower spine with flexion, extension, and lateral rotation without pain; full strength of the hip with abduction, flexion, and adduction; full strength of the knee with flexion and extension; full strength of the ankle with dorsalflexion, plantarflexion, medial and lateral deviation; 2+ patellar reflex, unable to elicit Achilles' reflex; decreased sensation to light touch over the lateral aspect of the upper and lower leg, lower>upper; full sensation to light touch over the medial upper and lower leg, and over the entire foot; negative straight leg raise   L leg: no visible deformities or skin changes in the hip or leg; no tenderness to palpation over the greater trochanter, SI joint, lower spine or elsewhere around the hip; full ROM of the hip with abduction, internal rotation, and external rotation; full ROM of the knee in flexion and extension; full ROM of the ankle in dorsalflexion, plantarflexion, medial deviation and lateral deviation; full ROM of lower spine with flexion, extension, and lateral rotation without pain; full strength of the hip with abduction, flexion, and adduction; full strength of the knee with flexion and extension; full strength of the ankle with dorsalflexion, plantarflexion, medial and lateral deviation; 2+ patellar reflex, unable to elicit Achilles' reflex; full sensation to  light touch over the lateral and medial aspects of the upper and lower leg, and over the entire foot; negative straight leg raise     Assessment:     Most likely a transient peripheral neuropathy that will resolve within some days. Unlikely radiculopathy with negative straight leg raise test and no actual pain down the leg. Unlikely lateral femoral cutaneous nerve syndrome as there is no inciting event, and the numbness extends down to the  lower leg. The numbness does not cause her significant distress and is not accompanied by pain, so no treatment is warranted at this time and she is likely to see a resolution of her symptoms in the coming days. Her bursitis, however, has flared again which is unlikely to affect this nervous symptom. Offered her a repeat injection today and she declined it. Instructed her to continue with her hip exercises and symptomatic management as before.    Plan:     - No hip injection today, but she can call the clinic to schedule one if her bursitis pain worsens - Continue with hip exercises and pain management as before  Tedra Coupe, MS4

## 2018-09-19 ENCOUNTER — Other Ambulatory Visit: Payer: Self-pay | Admitting: Family Medicine

## 2018-09-19 ENCOUNTER — Other Ambulatory Visit: Payer: Self-pay | Admitting: Cardiovascular Disease

## 2018-10-05 ENCOUNTER — Other Ambulatory Visit: Payer: Self-pay | Admitting: Family Medicine

## 2018-10-05 ENCOUNTER — Other Ambulatory Visit: Payer: Self-pay | Admitting: Cardiovascular Disease

## 2018-10-05 DIAGNOSIS — G2581 Restless legs syndrome: Secondary | ICD-10-CM

## 2018-10-25 ENCOUNTER — Other Ambulatory Visit: Payer: Self-pay | Admitting: Cardiovascular Disease

## 2018-10-25 ENCOUNTER — Other Ambulatory Visit: Payer: Self-pay | Admitting: Family Medicine

## 2018-10-25 DIAGNOSIS — I10 Essential (primary) hypertension: Secondary | ICD-10-CM

## 2018-10-25 DIAGNOSIS — G2581 Restless legs syndrome: Secondary | ICD-10-CM

## 2018-11-18 ENCOUNTER — Other Ambulatory Visit: Payer: Self-pay

## 2018-11-18 ENCOUNTER — Telehealth (INDEPENDENT_AMBULATORY_CARE_PROVIDER_SITE_OTHER): Payer: Medicare HMO | Admitting: Cardiovascular Disease

## 2018-11-18 ENCOUNTER — Encounter: Payer: Self-pay | Admitting: Cardiovascular Disease

## 2018-11-18 VITALS — HR 68 | Ht 63.0 in | Wt 164.0 lb

## 2018-11-18 DIAGNOSIS — I10 Essential (primary) hypertension: Secondary | ICD-10-CM

## 2018-11-18 DIAGNOSIS — E782 Mixed hyperlipidemia: Secondary | ICD-10-CM

## 2018-11-18 DIAGNOSIS — I2510 Atherosclerotic heart disease of native coronary artery without angina pectoris: Secondary | ICD-10-CM

## 2018-11-18 NOTE — Progress Notes (Signed)
Virtual Visit via Video Note   This visit type was conducted due to national recommendations for restrictions regarding the COVID-19 Pandemic (e.g. social distancing) in an effort to limit this patient's exposure and mitigate transmission in our community.  Due to her co-morbid illnesses, this patient is at least at moderate risk for complications without adequate follow up.  This format is felt to be most appropriate for this patient at this time.  All issues noted in this document were discussed and addressed.  A limited physical exam was performed with this format.  Please refer to the patient's chart for her consent to telehealth for [facility].   Date:  11/18/2018   ID:  Heather [name], DOB 07/27/1934, MRN [id number]  Patient Location: Home Provider Location: Home  PCP:  Mosie Lukes, MD  Cardiologist:  No primary care provider on file.  Electrophysiologist:  None   Evaluation Performed:  Follow-Up Visit  Chief Complaint:  CAD follow-up  History of Present Illness:    Heather [name] is a 84 y.o. female with hx of CAD, presenting for follow-up evaluation via video conferencing technology in light of the Covid 19 pandemic.   The patient does not have symptoms concerning for COVID-19 infection (fever, chills, cough, or new shortness of breath).   Overall she is doing well.  She continues to be under stress because of her husband's poor health as she functions as his primary care giver.  They are currently living down at [facility] during the pandemic.  She has not had any recent symptoms of chest pain, chest pressure, or dyspnea.  She denies leg swelling, orthopnea, PND, or heart palpitations.  She is been compliant with her medications.  When I saw her last year, her antihypertensive medicines were reduced because of symptomatic hypotension.  States that her weakness and dizziness have resolved since these medicine changes were made.  Past Medical History:  Diagnosis Date   . Angina   . Atrial fibrillation ([place])   . Borderline diabetes   . Cancer ([place])    cervical after hysterectomy   . Cataract    removed both eyes  . Coronary artery disease    post percutaneous coronary intervention in 2006  . Depression with anxiety 01/05/2012  . Dermatitis   . Diabetes mellitus without complication ([facility])    no meds  . Diarrhea 04/17/2015  . Diverticulitis    hx of  . DJD (degenerative joint disease) of knee 09/18/2011   knees- both, elbow- R, ankles   . DVT, lower extremity ([place])    rt. leg after knee surgery  . Dysrhythmia, cardiac    "palpitations"  . Fatigue   . Gastric ulcer    hx of  . GERD (gastroesophageal reflux disease)    prilosec daily  . H/O dizziness    pre-syncope/due to dehydration  . H/O hiatal hernia   . Headache 04/17/2015  . Hip pain 08/28/2011  . Hx of total knee replacement 11/12/11   left knee  . Hyperlipidemia   . Hyperlipidemia associated with type 2 diabetes mellitus ([place]) 05/27/2017  . Hypertension    takes medications daily  . Medicare annual wellness visit, subsequent 11/22/2013  . Occasional tremors   . Osteoporosis    taking vitamin d  . Persistent disorder of initiating or maintaining sleep   . Restless legs    takes mirapex daily  . S/P inguinal herniorrhaphy   . Stroke [facility]) 2000   TIA- series that have  resolved-  . UTI (lower urinary tract infection) 10/04/2013   Past Surgical History:  Procedure Laterality Date  . ABDOMINAL HYSTERECTOMY    . BRAIN SURGERY     related to stroke   2000 patient unaware states she has not had  . COLONOSCOPY    . CORONARY ANGIOPLASTY  03/2005; 06/2005   "1" plus "1"= "2 total"  . EYE SURGERY  11-16-13   cataract -right eye, left eye summer 2015  . FOOT SURGERY     left foot stress fracture repair  . HERNIA REPAIR     1989- esophageal hernia   . JOINT REPLACEMENT     left  . KNEE ARTHROSCOPY     2 on left and 1 on right  . TOTAL KNEE ARTHROPLASTY  11/12/2011   Procedure: TOTAL  KNEE ARTHROPLASTY;  Surgeon: Lorn Junes, MD;  Location: [place];  Service: Orthopedics;  Laterality: Left;  Dr Noemi Chapel wants 90 minutes for this case  . TOTAL KNEE ARTHROPLASTY Right 11/29/2014   Procedure: RIGHT TOTAL KNEE ARTHROPLASTY;  Surgeon: Vickey Huger, MD;  Location: [place];  Service: Orthopedics;  Laterality: Right;  . UPPER GASTROINTESTINAL ENDOSCOPY  2013     Current Meds  Medication Sig  . amLODipine (NORVASC) 10 MG tablet TAKE 1/2 TABLET EVERY DAY  . aspirin EC 81 MG tablet Take 1 tablet (81 mg total) by mouth daily.  . cholecalciferol (VITAMIN D) 1000 UNITS tablet Take 1,000 Units by mouth daily.  . diclofenac sodium (VOLTAREN) 1 % GEL Apply 2 g topically 3 (three) times daily as needed for pain.  . famotidine (PEPCID) 40 MG tablet Take 1 tablet (40 mg total) by mouth at bedtime.  . hyoscyamine (LEVSIN SL) 0.125 MG SL tablet Place 1 tablet (0.125 mg total) under the tongue every 4 (four) hours as needed.  . isosorbide mononitrate (IMDUR) 60 MG 24 hr tablet TAKE 1/2 TABLET EVERY DAY  . losartan (COZAAR) 100 MG tablet TAKE 1 TABLET EVERY DAY  . magnesium oxide (MAG-OX) 400 MG tablet Take 1,600 mg by mouth daily.   . metoprolol succinate (TOPROL-XL) 50 MG 24 hr tablet TAKE 1 TABLET AT BEDTIME (DOSE DECREASE)  . omeprazole (PRILOSEC) 40 MG capsule Take 40 mg by mouth daily.  . potassium chloride SA (K-DUR,KLOR-CON) 20 MEQ tablet TAKE 1 TABLET EVERY DAY  . pramipexole (MIRAPEX) 0.25 MG tablet TAKE 1 TABLET TWICE DAILY     Allergies:   Nitrofurantoin and Baclofen   Social History   Tobacco Use  . Smoking status: Never Smoker  . Smokeless tobacco: Never Used  Substance Use Topics  . Alcohol use: No    Alcohol/week: 0.0 standard drinks  . Drug use: No     Family Hx: The patient's family history includes Arthritis in an other family member; Cancer in her brother; Coronary artery disease in her mother; Depression in her brother; Diabetes in her brother and daughter; Heart  disease in her mother; Hypertension in her daughter and another family member; Other in an other family member; Stroke in her brother and father; Thyroid disease in her daughter. There is no history of Breast cancer, Colon cancer, Anesthesia problems, Hypotension, Malignant hyperthermia, Pseudochol deficiency, Colon polyps, Esophageal cancer, Rectal cancer, or Stomach cancer.  ROS:   Please see the history of present illness.    Positive for insomnia. All other systems reviewed and are negative.  Labs/Other Tests and Data Reviewed:    EKG:  An ECG dated 10/04/2017 was personally reviewed today  and demonstrated:  Normal sinus rhythm 58 bpm, right bundle branch block, left anterior fascicular block, no significant change from previous tracings  Recent Labs: 05/30/2018: ALT 16; BUN 23; Creatinine, Ser 1.01; Hemoglobin 13.7; Platelets 159.0; Potassium 4.2; Sodium 141; TSH 2.08   Recent Lipid Panel Lab Results  Component Value Date/Time   CHOL 152 05/30/2018 09:45 AM   TRIG 125.0 05/30/2018 09:45 AM   HDL 59.80 05/30/2018 09:45 AM   CHOLHDL 3 05/30/2018 09:45 AM   LDLCALC 67 05/30/2018 09:45 AM   LDLDIRECT 73.4 05/20/2014 04:51 PM    Wt Readings from Last 3 Encounters:  11/18/18 164 lb (74.4 kg)  09/04/18 162 lb (73.5 kg)  07/21/18 165 lb 12.8 oz (75.2 kg)     Objective:    Vital Signs:  Pulse 68   Ht 5\' 3"  (1.6 m)   Wt 164 lb (74.4 kg)   BMI 29.05 kg/m    VITAL SIGNS:  reviewed  The patient is alert, oriented, in no distress.  She is breathing comfortably in our conversation today.  Remaining exam not completed because of the virtual/telehealth nature of this visit  ASSESSMENT & PLAN:    1. Coronary artery disease native vessel, without angina: The patient will continue on low-dose aspirin and a statin drug. 2. Hypertension: The patient's antihypertensive medications were reduced last year because of symptomatic hypotension.  She is doing much better in this regard.  No  changes are made today. 3. Mixed hyperlipidemia: Most recent lipids are reviewed and she will continue on a statin drug.  LDL cholesterol is at goal less than 70 mg/dL.  COVID-19 Education: The signs and symptoms of COVID-19 were discussed with the patient and how to seek care for testing (follow up with PCP or arrange E-visit).  The importance of social distancing was discussed today.  Time:   Today, I have spent 20 minutes with the patient with telehealth technology discussing the above problems.     Medication Adjustments/Labs and Tests Ordered: Current medicines are reviewed at length with the patient today.  Concerns regarding medicines are outlined above.   Tests Ordered: No orders of the defined types were placed in this encounter.   Medication Changes: No orders of the defined types were placed in this encounter.   Disposition:  Follow up in 1 year(s)  Signed, [name], MD  11/18/2018 1:01 PM    [facility]

## 2018-11-18 NOTE — Patient Instructions (Signed)
Medication Instructions:  Your provider recommends that you continue on your current medications as directed. Please refer to the Current Medication list given to you today.    Labwork: None  Testing/Procedures: None  Follow-Up: Your provider wants you to follow-up in: 1 year with Dr. Cooper. You will receive a reminder letter in the mail two months in advance. If you don't receive a letter, please call our office to schedule the follow-up appointment.    

## 2018-12-02 ENCOUNTER — Ambulatory Visit (INDEPENDENT_AMBULATORY_CARE_PROVIDER_SITE_OTHER): Payer: Medicare HMO | Admitting: Family Medicine

## 2018-12-02 ENCOUNTER — Other Ambulatory Visit: Payer: Self-pay

## 2018-12-02 ENCOUNTER — Encounter: Payer: Self-pay | Admitting: Family Medicine

## 2018-12-02 DIAGNOSIS — E1121 Type 2 diabetes mellitus with diabetic nephropathy: Secondary | ICD-10-CM

## 2018-12-02 DIAGNOSIS — E1169 Type 2 diabetes mellitus with other specified complication: Secondary | ICD-10-CM

## 2018-12-02 DIAGNOSIS — I10 Essential (primary) hypertension: Secondary | ICD-10-CM

## 2018-12-02 DIAGNOSIS — Z7189 Other specified counseling: Secondary | ICD-10-CM

## 2018-12-02 DIAGNOSIS — E785 Hyperlipidemia, unspecified: Secondary | ICD-10-CM | POA: Diagnosis not present

## 2018-12-02 NOTE — Assessment & Plan Note (Signed)
She is staying in as much as possible and masking when she goes out. Continue cleaning and encouraged to obtain a pulse oximeter and start Zinc 50 mg and vitamin C 500 mg po bid and start deep breathing exercises. Call with any concerns.

## 2018-12-02 NOTE — Progress Notes (Signed)
Virtual Visit via Video Note  I connected with Heather [name] on 12/02/18 at 10:20 AM EDT by a video enabled telemedicine application and verified that I am speaking with the correct person using two identifiers.  Location: Patient: home Provider: home   I discussed the limitations of evaluation and management by telemedicine and the availability of in person appointments. The patient expressed understanding and agreed to proceed. Magdalene Molly, CMA was able to get the paient set up on a video visit.    Subjective:    Patient ID: Heather [name], female    DOB: August 12, 1934, 84 y.o.   MRN: [id number]  No chief complaint on file.   HPI Patient is in today for follow up on her chronic medical concerns including diabetes, hypertension, hyperlipidemia and more. No recent febrile illness or hospitalizations. No polyuria or polydipsia. No sugar concerns. [place] with her husband presently and he is undergoing PT for a sore hip but they are masking when they go out and handwashing. She feels well and offers no acute concerns. Denies CP/palp/SOB/HA/congestion/fevers/GI or GU c/o. Taking meds as prescribed  Past Medical History:  Diagnosis Date  . Angina   . Atrial fibrillation ([place])   . Borderline diabetes   . Cancer ([place])    cervical after hysterectomy   . Cataract    removed both eyes  . Coronary artery disease    post percutaneous coronary intervention in 2006  . Depression with anxiety 01/05/2012  . Dermatitis   . Diabetes mellitus without complication (HCC)    no meds  . Diarrhea 04/17/2015  . Diverticulitis    hx of  . DJD (degenerative joint disease) of knee 09/18/2011   knees- both, elbow- R, ankles   . DVT, lower extremity ([place])    rt. leg after knee surgery  . Dysrhythmia, cardiac    "palpitations"  . Fatigue   . Gastric ulcer    hx of  . GERD (gastroesophageal reflux disease)    prilosec daily  . H/O dizziness    pre-syncope/due to dehydration  . H/O  hiatal hernia   . Headache 04/17/2015  . Hip pain 08/28/2011  . Hx of total knee replacement 11/12/11   left knee  . Hyperlipidemia   . Hyperlipidemia associated with type 2 diabetes mellitus ([place]) 05/27/2017  . Hypertension    takes medications daily  . Medicare annual wellness visit, subsequent 11/22/2013  . Occasional tremors   . Osteoporosis    taking vitamin d  . Persistent disorder of initiating or maintaining sleep   . Restless legs    takes mirapex daily  . S/P inguinal herniorrhaphy   . Stroke [facility]) 2000   TIA- series that have resolved-  . UTI (lower urinary tract infection) 10/04/2013    Past Surgical History:  Procedure Laterality Date  . ABDOMINAL HYSTERECTOMY    . BRAIN SURGERY     related to stroke   2000 patient unaware states she has not had  . COLONOSCOPY    . CORONARY ANGIOPLASTY  03/2005; 06/2005   "1" plus "1"= "2 total"  . EYE SURGERY  11-16-13   cataract -right eye, left eye summer 2015  . FOOT SURGERY     left foot stress fracture repair  . HERNIA REPAIR     1989- esophageal hernia   . JOINT REPLACEMENT     left  . KNEE ARTHROSCOPY     2 on left and 1 on right  .  TOTAL KNEE ARTHROPLASTY  11/12/2011   Procedure: TOTAL KNEE ARTHROPLASTY;  Surgeon: Lorn Junes, MD;  Location: [place];  Service: Orthopedics;  Laterality: Left;  Dr Noemi Chapel wants 90 minutes for this case  . TOTAL KNEE ARTHROPLASTY Right 11/29/2014   Procedure: RIGHT TOTAL KNEE ARTHROPLASTY;  Surgeon: Vickey Huger, MD;  Location: [place];  Service: Orthopedics;  Laterality: Right;  . UPPER GASTROINTESTINAL ENDOSCOPY  2013    Family History  Problem Relation Age of Onset  . Stroke Father        family hx of M 1st degree relative <50  . Coronary artery disease Mother   . Heart disease Mother   . Depression Brother   . Stroke Brother   . Diabetes Brother   . Cancer Brother        bladder with mets  . Diabetes Daughter        borderline  . Hypertension Daughter   . Arthritis Other         family hx of  . Hypertension Other        family hx of  . Other Other        family hx of cardiovascular disorder  . Thyroid disease Daughter   . Breast cancer Neg Hx   . Colon cancer Neg Hx   . Anesthesia problems Neg Hx   . Hypotension Neg Hx   . Malignant hyperthermia Neg Hx   . Pseudochol deficiency Neg Hx   . Colon polyps Neg Hx   . Esophageal cancer Neg Hx   . Rectal cancer Neg Hx   . Stomach cancer Neg Hx     Social History   Socioeconomic History  . Marital status: Married    Spouse name: Not on file  . Number of children: 3  . Years of education: 34  . Highest education level: Not on file  Occupational History  . 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 Financial resource strain: Not on file  . Food insecurity:    Worry: Not on file    Inability: Not on file  . Transportation needs:    Medical: Not on file    Non-medical: Not on file  Tobacco Use  . Smoking status: Never Smoker  . Smokeless tobacco: Never Used  Substance and Sexual Activity  . Alcohol use: No    Alcohol/week: 0.0 standard drinks  . Drug use: No  . Sexual activity: Yes    Birth control/protection: Surgical    Comment: lives with husband, no dietary restrictions  Lifestyle  . Physical activity:    Days per week: Not on file    Minutes per session: Not on file  . Stress: Not on file  Relationships  . Social connections:    Talks on phone: Not on file    Gets together: Not on file    Attends religious service: Not on file    Active member of club or organization: Not on file    Attends meetings of clubs or organizations: Not on file    Relationship status: Not on file  . Intimate partner violence:    Fear of current or ex partner: Not on file    Emotionally abused: Not on file    Physically abused: Not on file    Forced sexual activity: Not on file  Other Topics Concern  . Not on file  Social History Narrative   Lives  with  husband, Caffeine use- Half Caffeine)- 2 cups daily.  3 children living, one passed away.   Education: HS. Business college.  Retired.     Outpatient Medications Prior to Visit  Medication Sig Dispense Refill  . amLODipine (NORVASC) 10 MG tablet TAKE 1/2 TABLET EVERY DAY 45 tablet 0  . aspirin EC 81 MG tablet Take 1 tablet (81 mg total) by mouth daily.    . cholecalciferol (VITAMIN D) 1000 UNITS tablet Take 1,000 Units by mouth daily.    . diclofenac sodium (VOLTAREN) 1 % GEL Apply 2 g topically 3 (three) times daily as needed for pain.    . famotidine (PEPCID) 40 MG tablet Take 1 tablet (40 mg total) by mouth at bedtime. 90 tablet 3  . hyoscyamine (LEVSIN SL) 0.125 MG SL tablet Place 1 tablet (0.125 mg total) under the tongue every 4 (four) hours as needed. 30 tablet 0  . isosorbide mononitrate (IMDUR) 60 MG 24 hr tablet TAKE 1/2 TABLET EVERY DAY 45 tablet 1  . losartan (COZAAR) 100 MG tablet TAKE 1 TABLET EVERY DAY 90 tablet 0  . magnesium oxide (MAG-OX) 400 MG tablet Take 1,600 mg by mouth daily.     . metoprolol succinate (TOPROL-XL) 50 MG 24 hr tablet TAKE 1 TABLET AT BEDTIME (DOSE DECREASE) 90 tablet 1  . omeprazole (PRILOSEC) 40 MG capsule Take 40 mg by mouth daily.    . potassium chloride SA (K-DUR,KLOR-CON) 20 MEQ tablet TAKE 1 TABLET EVERY DAY 90 tablet 1  . pramipexole (MIRAPEX) 0.25 MG tablet TAKE 1 TABLET TWICE DAILY 180 tablet 0  . simvastatin (ZOCOR) 20 MG tablet Take 1 tablet (20 mg total) by mouth at bedtime. 90 tablet 3   No facility-administered medications prior to visit.     Allergies  Allergen Reactions  . Nitrofurantoin Rash  . Baclofen Other (See Comments)    Hyperactivity     Review of Systems  Constitutional: Negative for chills, fever and malaise/fatigue.  HENT: Negative for congestion and hearing loss.   Eyes: Negative for blurred vision and discharge.  Respiratory: Negative for cough, sputum production and shortness of breath.   Cardiovascular:  Negative for chest pain, palpitations and leg swelling.  Gastrointestinal: Negative for abdominal pain, blood in stool, constipation, diarrhea, heartburn, nausea and vomiting.  Genitourinary: Negative for dysuria, frequency, hematuria and urgency.  Musculoskeletal: Negative for back pain, falls and myalgias.  Skin: Negative for rash.  Neurological: Negative for dizziness, sensory change, loss of consciousness, weakness and headaches.  Endo/Heme/Allergies: Negative for environmental allergies. Does not bruise/bleed easily.  Psychiatric/Behavioral: Negative for depression and suicidal ideas. The patient is not nervous/anxious and does not have insomnia.        Objective:    Physical Exam Constitutional:      Appearance: Normal appearance. She is not ill-appearing.  HENT:     Head: Normocephalic and atraumatic.     Nose: Nose normal.  Eyes:     General:        Right eye: No discharge.        Left eye: No discharge.  Pulmonary:     Effort: Pulmonary effort is normal.  Neurological:     Mental Status: She is alert and oriented to person, place, and time.  Psychiatric:        Mood and Affect: Mood normal.        Behavior: Behavior normal.     BP (!) 144/74 (BP Location: Left Arm, Patient Position: Sitting, Cuff Size: Normal)  Pulse (!) 55   Wt 162 lb (73.5 kg)   BMI 28.70 kg/m  Wt Readings from Last 3 Encounters:  12/02/18 162 lb (73.5 kg)  11/18/18 164 lb (74.4 kg)  09/04/18 162 lb (73.5 kg)    Diabetic Foot Exam - Simple   No data filed     Lab Results  Component Value Date   WBC 6.5 05/30/2018   HGB 13.7 05/30/2018   HCT 40.5 05/30/2018   PLT 159.0 05/30/2018   GLUCOSE 137 (H) 05/30/2018   CHOL 152 05/30/2018   TRIG 125.0 05/30/2018   HDL 59.80 05/30/2018   LDLDIRECT 73.4 05/20/2014   LDLCALC 67 05/30/2018   ALT 16 05/30/2018   AST 18 05/30/2018   NA 141 05/30/2018   K 4.2 05/30/2018   CL 103 05/30/2018   CREATININE 1.01 05/30/2018   BUN 23 05/30/2018    CO2 30 05/30/2018   TSH 2.08 05/30/2018   INR 0.98 11/16/2014   HGBA1C 7.0 (H) 05/30/2018   MICROALBUR <0.7 05/30/2018    Lab Results  Component Value Date   TSH 2.08 05/30/2018   Lab Results  Component Value Date   WBC 6.5 05/30/2018   HGB 13.7 05/30/2018   HCT 40.5 05/30/2018   MCV 93.0 05/30/2018   PLT 159.0 05/30/2018   Lab Results  Component Value Date   NA 141 05/30/2018   K 4.2 05/30/2018   CO2 30 05/30/2018   GLUCOSE 137 (H) 05/30/2018   BUN 23 05/30/2018   CREATININE 1.01 05/30/2018   BILITOT 0.6 05/30/2018   ALKPHOS 78 05/30/2018   AST 18 05/30/2018   ALT 16 05/30/2018   PROT 6.2 05/30/2018   ALBUMIN 4.3 05/30/2018   CALCIUM 9.3 05/30/2018   ANIONGAP 9 10/22/2016   GFR 55.82 (L) 05/30/2018   Lab Results  Component Value Date   CHOL 152 05/30/2018   Lab Results  Component Value Date   HDL 59.80 05/30/2018   Lab Results  Component Value Date   LDLCALC 67 05/30/2018   Lab Results  Component Value Date   TRIG 125.0 05/30/2018   Lab Results  Component Value Date   CHOLHDL 3 05/30/2018   Lab Results  Component Value Date   HGBA1C 7.0 (H) 05/30/2018       Assessment & Plan:   Problem List Items Addressed This Visit    Essential hypertension    She has gotten a new blood pressure cuff and she is encouraged to check her vitals weekly and let us know if any concerns develop.       DM (diabetes mellitus) ([place])    hgba1c acceptable, minimize simple carbs. Increase exercise as tolerated. Continue current meds. Will repeat labs in 3 months or ssoner if she returns to town. [place] with her husband who is undergoing therapy for a hurt hip      Hyperlipidemia associated with type 2 diabetes mellitus ([place])    Encouraged heart healthy diet, increase exercise, avoid trans fats, consider a krill oil cap daily      Educated About Covid-19 Virus Infection    She is staying in as much as possible and masking when she goes  out. Continue cleaning and encouraged to obtain a pulse oximeter and start Zinc 50 mg and vitamin C 500 mg po bid and start deep breathing exercises. Call with any concerns.          I am having Camron M. Barra maintain her magnesium  oxide, cholecalciferol, aspirin EC, diclofenac sodium, hyoscyamine, simvastatin, famotidine, isosorbide mononitrate, potassium chloride SA, metoprolol succinate, losartan, amLODipine, pramipexole, and omeprazole.  No orders of the defined types were placed in this encounter.   I discussed the assessment and treatment plan with the patient. The patient was provided an opportunity to ask questions and all were answered. The patient agreed with the plan and demonstrated an understanding of the instructions.   The patient was advised to call back or seek an in-person evaluation if the symptoms worsen or if the condition fails to improve as anticipated.  I provided 25 minutes of non-face-to-face time during this encounter.   [name], MD

## 2018-12-02 NOTE — Assessment & Plan Note (Signed)
She has gotten a new blood pressure cuff and she is encouraged to check her vitals weekly and let us know if any concerns develop.

## 2018-12-02 NOTE — Assessment & Plan Note (Signed)
Encouraged heart healthy diet, increase exercise, avoid trans fats, consider a krill oil cap daily 

## 2018-12-02 NOTE — Assessment & Plan Note (Signed)
hgba1c acceptable, minimize simple carbs. Increase exercise as tolerated. Continue current meds. Will repeat labs in 3 months or ssoner if she returns to town. Pres[place] with her husband who is undergoing therapy for a hurt hip

## 2018-12-24 ENCOUNTER — Ambulatory Visit: Payer: Medicare HMO | Admitting: Cardiovascular Disease

## 2018-12-26 ENCOUNTER — Other Ambulatory Visit: Payer: Self-pay | Admitting: Family Medicine

## 2018-12-26 DIAGNOSIS — G2581 Restless legs syndrome: Secondary | ICD-10-CM

## 2019-01-12 ENCOUNTER — Other Ambulatory Visit: Payer: Self-pay | Admitting: Family Medicine

## 2019-01-12 ENCOUNTER — Other Ambulatory Visit: Payer: Self-pay | Admitting: Cardiovascular Disease

## 2019-01-12 DIAGNOSIS — I10 Essential (primary) hypertension: Secondary | ICD-10-CM

## 2019-01-19 ENCOUNTER — Other Ambulatory Visit: Payer: Self-pay

## 2019-01-19 MED ORDER — SIMVASTATIN 20 MG PO TABS: 20.0000 mg | ORAL_TABLET | Freq: Every day | ORAL | 3 refills | Status: DC

## 2019-01-23 ENCOUNTER — Encounter: Payer: Self-pay | Admitting: Family Medicine

## 2019-01-25 ENCOUNTER — Other Ambulatory Visit: Payer: Self-pay | Admitting: Family Medicine

## 2019-01-25 DIAGNOSIS — M79672 Pain in left foot: Secondary | ICD-10-CM

## 2019-01-29 ENCOUNTER — Ambulatory Visit (INDEPENDENT_AMBULATORY_CARE_PROVIDER_SITE_OTHER): Payer: Medicare HMO

## 2019-01-29 ENCOUNTER — Ambulatory Visit: Payer: Medicare HMO | Admitting: Podiatry

## 2019-01-29 ENCOUNTER — Encounter: Payer: Self-pay | Admitting: Podiatry

## 2019-01-29 ENCOUNTER — Other Ambulatory Visit: Payer: Self-pay | Admitting: Podiatry

## 2019-01-29 ENCOUNTER — Other Ambulatory Visit: Payer: Self-pay

## 2019-01-29 VITALS — Temp 97.4°F

## 2019-01-29 DIAGNOSIS — S96912S Strain of unspecified muscle and tendon at ankle and foot level, left foot, sequela: Secondary | ICD-10-CM

## 2019-01-29 DIAGNOSIS — M7750 Other enthesopathy of unspecified foot: Secondary | ICD-10-CM

## 2019-01-29 DIAGNOSIS — S96911A Strain of unspecified muscle and tendon at ankle and foot level, right foot, initial encounter: Secondary | ICD-10-CM

## 2019-01-29 DIAGNOSIS — M779 Enthesopathy, unspecified: Secondary | ICD-10-CM

## 2019-01-29 DIAGNOSIS — M7752 Other enthesopathy of left foot: Secondary | ICD-10-CM | POA: Diagnosis not present

## 2019-01-29 DIAGNOSIS — N3281 Overactive bladder: Secondary | ICD-10-CM | POA: Insufficient documentation

## 2019-01-29 DIAGNOSIS — N644 Mastodynia: Secondary | ICD-10-CM | POA: Insufficient documentation

## 2019-01-29 MED ORDER — DICLOFENAC SODIUM 1 % TD GEL: 2.0000 g | Freq: Three times a day (TID) | TRANSDERMAL | 2 refills | Status: DC | PRN

## 2019-01-29 NOTE — Patient Instructions (Signed)
Please bring your inserts with you next appointment You can use Voltaren gel daily to the area to help with the pain/inflammation I am going to order an MRI to rule out a stress fracture or tendon issue.   If was nice to meet you today. If you have any questions or any further concerns, please feel fee to give me a call. You can call our office at [phone] or please feel fee to send me a message through [place].

## 2019-01-29 NOTE — Progress Notes (Signed)
Subjective:   Patient ID: Heather [name], female   DOB: 84 y.o.   MRN: [id number]   HPI 84 year old female presents the office with concerns of left foot pain which is been ongoing for last 2 to 3 months.  She states it hurts to walk and when she is on her feet.  She describes pain on the instep area pointing to the navicular tuberosity. She states that she previously has had a stress fracture to this bone.  This was treated at [facility] several years ago.  She denies any recent injury or trauma.  She states that it does hurt with walking.  She had no recent treatment.  She has no other concerns.   Review of Systems  All other systems reviewed and are negative.  Past Medical History:  Diagnosis Date  . Angina   . Atrial fibrillation ([place])   . Borderline diabetes   . Cancer ([place])    cervical after hysterectomy   . Cataract    removed both eyes  . Coronary artery disease    post percutaneous coronary intervention in 2006  . Depression with anxiety 01/05/2012  . Dermatitis   . Diabetes mellitus without complication (HCC)    no meds  . Diarrhea 04/17/2015  . Diverticulitis    hx of  . DJD (degenerative joint disease) of knee 09/18/2011   knees- both, elbow- R, ankles   . DVT, lower extremity ([place])    rt. leg after knee surgery  . Dysrhythmia, cardiac    "palpitations"  . Fatigue   . Gastric ulcer    hx of  . GERD (gastroesophageal reflux disease)    prilosec daily  . H/O dizziness    pre-syncope/due to dehydration  . H/O hiatal hernia   . Headache 04/17/2015  . Hip pain 08/28/2011  . Hx of total knee replacement 11/12/11   left knee  . Hyperlipidemia   . Hyperlipidemia associated with type 2 diabetes mellitus ([place]) 05/27/2017  . Hypertension    takes medications daily  . Medicare annual wellness visit, subsequent 11/22/2013  . Occasional tremors   . Osteoporosis    taking vitamin d  . Persistent disorder of initiating or maintaining sleep   . Restless legs    takes  mirapex daily  . S/P inguinal herniorrhaphy   . Stroke [facility]) 2000   TIA- series that have resolved-  . UTI (lower urinary tract infection) 10/04/2013    Past Surgical History:  Procedure Laterality Date  . ABDOMINAL HYSTERECTOMY    . BRAIN SURGERY     related to stroke   2000 patient unaware states she has not had  . COLONOSCOPY    . CORONARY ANGIOPLASTY  03/2005; 06/2005   "1" plus "1"= "2 total"  . EYE SURGERY  11-16-13   cataract -right eye, left eye summer 2015  . FOOT SURGERY     left foot stress fracture repair  . HERNIA REPAIR     1989- esophageal hernia   . JOINT REPLACEMENT     left  . KNEE ARTHROSCOPY     2 on left and 1 on right  . TOTAL KNEE ARTHROPLASTY  11/12/2011   Procedure: TOTAL KNEE ARTHROPLASTY;  Surgeon: Lorn Junes, MD;  Location: [place];  Service: Orthopedics;  Laterality: Left;  Dr Noemi Chapel wants 90 minutes for this case  . TOTAL KNEE ARTHROPLASTY Right 11/29/2014   Procedure: RIGHT TOTAL KNEE ARTHROPLASTY;  Surgeon: Vickey Huger, MD;  Location: [place];  Service:  Orthopedics;  Laterality: Right;  . UPPER GASTROINTESTINAL ENDOSCOPY  2013     Current Outpatient Medications:  .  amLODipine (NORVASC) 10 MG tablet, TAKE 1/2 TABLET EVERY DAY, Disp: 45 tablet, Rfl: 0 .  aspirin EC 81 MG tablet, Take 1 tablet (81 mg total) by mouth daily., Disp: , Rfl:  .  cholecalciferol (VITAMIN D) 1000 UNITS tablet, Take 1,000 Units by mouth daily., Disp: , Rfl:  .  diclofenac sodium (VOLTAREN) 1 % GEL, Apply 2 g topically 3 (three) times daily as needed., Disp: 100 g, Rfl: 2 .  famotidine (PEPCID) 40 MG tablet, Take 1 tablet (40 mg total) by mouth at bedtime., Disp: 90 tablet, Rfl: 3 .  hyoscyamine (LEVSIN SL) 0.125 MG SL tablet, Place 1 tablet (0.125 mg total) under the tongue every 4 (four) hours as needed., Disp: 30 tablet, Rfl: 0 .  isosorbide mononitrate (IMDUR) 60 MG 24 hr tablet, TAKE 1/2 TABLET EVERY DAY, Disp: 45 tablet, Rfl: 3 .  losartan (COZAAR) 100 MG tablet, TAKE 1  TABLET EVERY DAY, Disp: 90 tablet, Rfl: 0 .  magnesium oxide (MAG-OX) 400 MG tablet, Take 1,600 mg by mouth daily. , Disp: , Rfl:  .  metoprolol succinate (TOPROL-XL) 50 MG 24 hr tablet, TAKE 1 TABLET AT BEDTIME (DOSE DECREASE), Disp: 90 tablet, Rfl: 1 .  omeprazole (PRILOSEC) 20 MG capsule, TAKE 1 CAPSULE EVERY DAY AS NEEDED, Disp: 90 capsule, Rfl: 1 .  omeprazole (PRILOSEC) 40 MG capsule, Take 40 mg by mouth daily., Disp: , Rfl:  .  potassium chloride SA (K-DUR,KLOR-CON) 20 MEQ tablet, TAKE 1 TABLET EVERY DAY, Disp: 90 tablet, Rfl: 1 .  pramipexole (MIRAPEX) 0.25 MG tablet, TAKE 1 TABLET TWICE DAILY, Disp: 180 tablet, Rfl: 0 .  simvastatin (ZOCOR) 20 MG tablet, Take 1 tablet (20 mg total) by mouth at bedtime., Disp: 90 tablet, Rfl: 3  Allergies  Allergen Reactions  . Nitrofurantoin Rash  . Baclofen Other (See Comments)    Hyperactivity         Objective:  Physical Exam  General: AAO x3, NAD  Dermatological: Skin is warm, dry and supple bilateral. Nails x 10 are well manicured; remaining integument appears unremarkable at this time. There are no open sores, no preulcerative lesions, no rash or signs of infection present.  Vascular: Dorsalis Pedis artery and Posterior Tibial artery pedal pulses are 2/4 bilateral with immedate capillary fill time. Pedal hair growth present. No varicosities and no lower extremity edema present bilateral. There is no pain with calf compression, swelling, warmth, erythema.   Neruologic: Grossly intact via light touch bilateral. Vibratory intact via tuning fork bilateral. Protective threshold with Semmes Wienstein monofilament intact to all pedal sites bilateral.   Musculoskeletal: There is tenderness palpation on navicular tuberosity on the medial aspect left foot.  No pain the dorsal navicular.  No pain on the course the posterior tibial tendon.  There is mild edema to this area.  There is no erythema or warmth.  No pain to flexor, extensor tendons.  No  other areas of tenderness elicited at this time.  Muscular strength 5/5 in all groups tested bilateral.  Gait: Unassisted, Nonantalgic.       Assessment:   84 year old female insertional posterior tibial tendinitis, capsulitis     Plan:  -Treatment options discussed including all alternatives, risks, and complications -Etiology of symptoms were discussed -X-rays were obtained and reviewed with the patient.  There is spurring present off the medial aspect navicular tuberosity likely given old stress  fracture, surgery.  Also calcifications are present diffusely in the plantar aspect of foot as well as anterior aspect the leg. -Given the pain and swelling directly immobilization in a Tri-Lock ankle brace was dispensed today.  Can use Voltaren gel daily.  I also ordered an MRI to help stress fracture, partial tear of the posterior tibial tendon given her history of having stress fracture previously as well as surgery.  Trula Slade DPM

## 2019-02-02 ENCOUNTER — Telehealth: Payer: Self-pay | Admitting: *Deleted

## 2019-02-02 DIAGNOSIS — S82899A Other fracture of unspecified lower leg, initial encounter for closed fracture: Secondary | ICD-10-CM

## 2019-02-02 NOTE — Telephone Encounter (Addendum)
Dr. Jacqualyn Posey request MRI of left ankle to include the navicular area. Orders faxed to [facility] with notification to include the navicular area and given to Gretta Arab, RN for [employer].

## 2019-02-03 ENCOUNTER — Telehealth: Payer: Self-pay | Admitting: *Deleted

## 2019-02-03 NOTE — Telephone Encounter (Signed)
I talked to Heather [name] about it and she was going to do the request for the MRI. Heather [name]

## 2019-02-03 NOTE — Telephone Encounter (Signed)
-----   Message from Trula Slade, DPM sent at 02/02/2019  7:57 AM EDT ----- Just wanted to follow up to see if the MRI had been ordered. Thanks.

## 2019-02-19 ENCOUNTER — Other Ambulatory Visit: Payer: Self-pay

## 2019-02-19 ENCOUNTER — Encounter: Payer: Self-pay | Admitting: Podiatry

## 2019-02-19 ENCOUNTER — Ambulatory Visit: Payer: Medicare HMO | Admitting: Podiatry

## 2019-02-19 VITALS — Temp 97.6°F

## 2019-02-19 DIAGNOSIS — M79672 Pain in left foot: Secondary | ICD-10-CM

## 2019-02-19 DIAGNOSIS — S96912S Strain of unspecified muscle and tendon at ankle and foot level, left foot, sequela: Secondary | ICD-10-CM

## 2019-02-19 DIAGNOSIS — G8929 Other chronic pain: Secondary | ICD-10-CM

## 2019-02-20 ENCOUNTER — Telehealth: Payer: Self-pay | Admitting: Podiatry

## 2019-02-20 NOTE — Telephone Encounter (Signed)
Pt received a support boot with a pump and the metal piece behind the air release is causing her pain/not padding behind it. Please call her and advise her on what she needs to do.

## 2019-02-26 ENCOUNTER — Ambulatory Visit
Admission: RE | Admit: 2019-02-26 | Discharge: 2019-02-26 | Disposition: A | Discharge status: Home / Self Care | Payer: Medicare HMO | Source: Ambulatory Visit | Attending: Podiatry | Admitting: Podiatry

## 2019-02-26 ENCOUNTER — Other Ambulatory Visit: Payer: Self-pay

## 2019-02-26 DIAGNOSIS — S86312A Strain of muscle(s) and tendon(s) of peroneal muscle group at lower leg level, left leg, initial encounter: Secondary | ICD-10-CM | POA: Diagnosis not present

## 2019-03-01 NOTE — Progress Notes (Signed)
Subjective: 84 year old female presents the office today for follow-up evaluation of left foot pain.  She said that she was not able to wear the Tri-Lock brace so she is bringing this back today.  She says overall her foot is still the same.  She states that she has pain she cannot stand anything touching the bone, pointing to the navicular. Denies any systemic complaints such as fevers, chills, nausea, vomiting. No acute changes since last appointment, and no other complaints at this time.   Objective: AAO x3, NAD DP/PT pulses palpable bilaterally, CRT less than 3 seconds There is tenderness on the course of the posterior tibial tendon on the insertion of the navicular as well.  Tendon appears to be intact.  There is edema to this area but somewhat improved.  There is no erythema warmth.  No area skin breakdown. No open lesions or pre-ulcerative lesions.  No pain with calf compression, swelling, warmth, erythema  Assessment: Continue left foot pain, rule out peroneal tendon tear, stress  Plan: -All treatment options discussed with the patient including all alternatives, risks, complications.  -Was having on the MRI results today which she is scheduled for next week.  For now given her continued pain immobilization in the cam boot.  Discussed their injection but would hold off until I can get results of the MRI.  Continue ice elevation. -Patient encouraged to call the office with any questions, concerns, change in symptoms.   Trula Slade DPM

## 2019-03-04 ENCOUNTER — Other Ambulatory Visit: Payer: Self-pay | Admitting: Family Medicine

## 2019-03-04 DIAGNOSIS — G2581 Restless legs syndrome: Secondary | ICD-10-CM

## 2019-03-05 ENCOUNTER — Other Ambulatory Visit: Payer: Self-pay

## 2019-03-05 ENCOUNTER — Ambulatory Visit: Payer: Medicare HMO | Admitting: Podiatry

## 2019-03-05 ENCOUNTER — Ambulatory Visit: Payer: Medicare HMO | Admitting: Family Medicine

## 2019-03-05 ENCOUNTER — Encounter: Payer: Self-pay | Admitting: Podiatry

## 2019-03-05 DIAGNOSIS — M79672 Pain in left foot: Secondary | ICD-10-CM | POA: Diagnosis not present

## 2019-03-05 DIAGNOSIS — M7752 Other enthesopathy of left foot: Secondary | ICD-10-CM | POA: Diagnosis not present

## 2019-03-05 DIAGNOSIS — S96912S Strain of unspecified muscle and tendon at ankle and foot level, left foot, sequela: Secondary | ICD-10-CM

## 2019-03-05 DIAGNOSIS — G8929 Other chronic pain: Secondary | ICD-10-CM

## 2019-03-05 DIAGNOSIS — M779 Enthesopathy, unspecified: Secondary | ICD-10-CM

## 2019-03-05 MED ORDER — METHYLPREDNISOLONE 4 MG PO TBPK: ORAL_TABLET | ORAL | 0 refills | Status: DC

## 2019-03-05 NOTE — Progress Notes (Signed)
Subjective: 84 year old female presents the office today for follow evaluation of left foot pain.  She started wearing a tall cam boot which got better.  The short cam boot was causing irritation.  She still gets swelling to the foot although improving still having pain mostly to the medial aspect. Denies any systemic complaints such as fevers, chills, nausea, vomiting. No acute changes since last appointment, and no other complaints at this time.   Objective: AAO x3, NAD DP/PT pulses palpable bilaterally, CRT less than 3 seconds There still tenderness palpation generally on the medial aspect, navicular tuberosity. The discomfort reports the posterior tibial tendon today.  Mild scab in the dorsal midfoot.  No pain of the peroneal tendons, ATFL.  Mild discomfort on the arch of the foot but no specific area pinpoint tenderness.  Mild edema to the medial aspect but no erythema or warmth. No open lesions or pre-ulcerative lesions.  No pain with calf compression, swelling, warmth, erythema  MRI:  IMPRESSION: 1. Tibialis posterior tendinopathy adjacent to the marker indicating site of patient's pain. Correlate clinically in assessing for tibialis posterior dysfunction. 2. Longitudinal tearing of the peroneus brevis tendon along the lateral malleolus. 3. Fluid collection tracking along the plantar subcutaneous tissues closely associated with the plantar fascia. This may represent localized bursitis. Internal fatty density along the fluid collection may reflect sheared off subcutaneous adipose tissue or simply synovitis. 4. Plantar calcaneal spur. 5. Considerable degenerative arthropathy in the midfoot and along the Lisfranc joint. 6. Abnormal attenuated ATFL, likely from a prior tear. The calcaneofibular ligament is likewise indistinct.   Assessment: Tendinitis, left foot pain  Plan: -All treatment options discussed with the patient including all alternatives, risks, complications.  -MRI  results reviewed -Medrol dose pack -Discussed steroid injection risks of this today.  We injected on medial aspect of foot just navicular tuberosity likely could not be directly infiltrated in the posterior tibial tendon.  Mixture of 1/2 cc dexamethasone phosphate, 0.5 cc of Marcaine plain was infiltrated without complications.  Postinjection care discussed. -Remain in CAM boot -Follow-up in 3-4 weeks.  If improving physical therapy. -Patient encouraged to call the office with any questions, concerns, change in symptoms.   Trula Slade DPM

## 2019-03-06 ENCOUNTER — Ambulatory Visit (INDEPENDENT_AMBULATORY_CARE_PROVIDER_SITE_OTHER): Payer: Medicare HMO | Admitting: Family Medicine

## 2019-03-06 ENCOUNTER — Other Ambulatory Visit: Payer: Self-pay | Admitting: Family Medicine

## 2019-03-06 DIAGNOSIS — I10 Essential (primary) hypertension: Secondary | ICD-10-CM

## 2019-03-06 DIAGNOSIS — N816 Rectocele: Secondary | ICD-10-CM

## 2019-03-06 DIAGNOSIS — M79672 Pain in left foot: Secondary | ICD-10-CM

## 2019-03-06 DIAGNOSIS — E1121 Type 2 diabetes mellitus with diabetic nephropathy: Secondary | ICD-10-CM | POA: Diagnosis not present

## 2019-03-06 DIAGNOSIS — N289 Disorder of kidney and ureter, unspecified: Secondary | ICD-10-CM

## 2019-03-06 DIAGNOSIS — G2581 Restless legs syndrome: Secondary | ICD-10-CM | POA: Diagnosis not present

## 2019-03-06 MED ORDER — AMLODIPINE BESYLATE 10 MG PO TABS: 5.0000 mg | ORAL_TABLET | Freq: Every day | ORAL | 1 refills | Status: DC

## 2019-03-06 MED ORDER — FAMOTIDINE 40 MG PO TABS: 40.0000 mg | ORAL_TABLET | Freq: Every day | ORAL | 3 refills | Status: DC

## 2019-03-06 MED ORDER — HYOSCYAMINE SULFATE 0.125 MG SL SUBL: 0.1250 mg | SUBLINGUAL_TABLET | SUBLINGUAL | 0 refills | Status: DC | PRN

## 2019-03-06 MED ORDER — LOSARTAN POTASSIUM 100 MG PO TABS: 100.0000 mg | ORAL_TABLET | Freq: Every day | ORAL | 1 refills | Status: DC

## 2019-03-06 MED ORDER — PRAMIPEXOLE DIHYDROCHLORIDE 0.25 MG PO TABS: 0.2500 mg | ORAL_TABLET | Freq: Two times a day (BID) | ORAL | 1 refills | Status: DC

## 2019-03-06 MED ORDER — OMEPRAZOLE 20 MG PO CPDR: DELAYED_RELEASE_CAPSULE | ORAL | 1 refills | Status: DC

## 2019-03-06 NOTE — Progress Notes (Signed)
amb  

## 2019-03-08 DIAGNOSIS — N819 Female genital prolapse, unspecified: Secondary | ICD-10-CM | POA: Insufficient documentation

## 2019-03-08 DIAGNOSIS — M79672 Pain in left foot: Secondary | ICD-10-CM | POA: Insufficient documentation

## 2019-03-08 DIAGNOSIS — N816 Rectocele: Secondary | ICD-10-CM | POA: Insufficient documentation

## 2019-03-08 NOTE — Assessment & Plan Note (Signed)
Hydrate and monitor 

## 2019-03-08 NOTE — Assessment & Plan Note (Signed)
hgba1c acceptable, minimize simple carbs. Increase exercise as tolerated. Continue current meds 

## 2019-03-08 NOTE — Assessment & Plan Note (Signed)
Well controlled, no changes to meds. Encouraged heart healthy diet such as the DASH diet and exercise as tolerated.  °

## 2019-03-08 NOTE — Assessment & Plan Note (Signed)
She has had trouble with her left ankle/foot but dropped cinder block on it this year and now is in a walking boot and following with Dr Earleen Newport of podiatry.

## 2019-03-08 NOTE — Progress Notes (Signed)
Virtual Visit via Video Note  I connected with Heather [name] on 03/06/19 at 10:00 AM EDT by a video enabled telemedicine application and verified that I am speaking with the correct person using two identifiers.  Location: Patient: home Provider: home   I discussed the limitations of evaluation and management by telemedicine and the availability of in person appointments. The patient expressed understanding and agreed to proceed. Magdalene Molly, CMA was able to get patient set up on visit, video   Subjective:    Patient ID: Heather [name], female    DOB: [birth year], 84 y.o.   MRN: [id number]  No chief complaint on file.   HPI Patient is in today for follow-up on chronic medical conditions including hypertension, hyperlipidemia, diabetes and more.  She feels well today.  No recent febrile illnesses or hospitalizations.  She has been struggling with an increase in left foot pain.  She has suffered for years within this year she dropped a cinder block on her foot and her pain is worsened significantly.  She is now in a walking boot and following with podiatry and they are doing their best to avoid surgery.No other acute concerns. No polyuria or polydipsia. Denies CP/palp/SOB/HA/congestion/fevers/GI or GU c/o. Taking meds as prescribed  Past Medical History:  Diagnosis Date   Angina    Atrial fibrillation ([place])    Borderline diabetes    Cancer ([place])    cervical after hysterectomy    Cataract    removed both eyes   Coronary artery disease    post percutaneous coronary intervention in 2006   Depression with anxiety 01/05/2012   Dermatitis    Diabetes mellitus without complication ([place])    no meds   Diarrhea 04/17/2015   Diverticulitis    hx of   DJD (degenerative joint disease) of knee 09/18/2011   knees- both, elbow- R, ankles    DVT, lower extremity ([facility])    rt. leg after knee surgery   Dysrhythmia, cardiac    "palpitations"   Fatigue    Gastric ulcer    hx of     GERD (gastroesophageal reflux disease)    prilosec daily   H/O dizziness    pre-syncope/due to dehydration   H/O hiatal hernia    Headache 04/17/2015   Hip pain 08/28/2011   Hx of total knee replacement 11/12/11   left knee   Hyperlipidemia    Hyperlipidemia associated with type 2 diabetes mellitus ([place]) 05/27/2017   Hypertension    takes medications daily   Medicare annual wellness visit, subsequent 11/22/2013   Occasional tremors    Osteoporosis    taking vitamin d   Persistent disorder of initiating or maintaining sleep    Restless legs    takes mirapex daily   S/P inguinal herniorrhaphy    Stroke ([place]) 2000   TIA- series that have resolved-   UTI (lower urinary tract infection) 10/04/2013    Past Surgical History:  Procedure Laterality Date   ABDOMINAL HYSTERECTOMY     BRAIN SURGERY     related to stroke   2000 patient unaware states she has not had   COLONOSCOPY     CORONARY ANGIOPLASTY  03/2005; 06/2005   "1" plus "1"= "2 total"   EYE SURGERY  11-16-13   cataract -right eye, left eye summer 2015   FOOT SURGERY     left foot stress fracture repair   HERNIA REPAIR     1989- esophageal hernia    JOINT REPLACEMENT  left   KNEE ARTHROSCOPY     2 on left and 1 on right   TOTAL KNEE ARTHROPLASTY  11/12/2011   Procedure: TOTAL KNEE ARTHROPLASTY;  Surgeon: Lorn Junes, MD;  Location: [facility];  Service: Orthopedics;  Laterality: Left;  Dr Noemi Chapel wants 90 minutes for this case   TOTAL KNEE ARTHROPLASTY Right 11/29/2014   Procedure: RIGHT TOTAL KNEE ARTHROPLASTY;  Surgeon: Vickey Huger, MD;  Location: [facility];  Service: Orthopedics;  Laterality: Right;   UPPER GASTROINTESTINAL ENDOSCOPY  2013    Family History  Problem Relation Age of Onset   Stroke Father        family hx of M 1st degree relative <50   Coronary artery disease Mother    Heart disease Mother    Depression Brother    Stroke Brother    Diabetes Brother    Cancer Brother         bladder with mets   Diabetes Daughter        borderline   Hypertension Daughter    Arthritis Other        family hx of   Hypertension Other        family hx of   Other Other        family hx of cardiovascular disorder   Thyroid disease Daughter    Breast cancer Neg Hx    Colon cancer Neg Hx    Anesthesia problems Neg Hx    Hypotension Neg Hx    Malignant hyperthermia Neg Hx    Pseudochol deficiency Neg Hx    Colon polyps Neg Hx    Esophageal cancer Neg Hx    Rectal cancer Neg Hx    Stomach cancer Neg Hx     Social History   Socioeconomic History   Marital status: Married    Spouse name: Not on file   Number of children: 3   Years of education: 12   Highest education level: Not on file  Occupational History   Occupation: works partime in Office manager: RETIRED    Comment: retired  Scientist, product/process development strain: Not on file   Food insecurity    Worry: Not on file    Inability: Not on Lexicographer needs    Medical: Not on file    Non-medical: Not on file  Tobacco Use   Smoking status: Never Smoker   Smokeless tobacco: Never Used  Substance and Sexual Activity   Alcohol use: No    Alcohol/week: 0.0 standard drinks   Drug use: No   Sexual activity: Yes    Birth control/protection: Surgical    Comment: lives with husband, no dietary restrictions  Lifestyle   Physical activity    Days per week: Not on file    Minutes per session: Not on file   Stress: Not on file  Relationships   Social connections    Talks on phone: Not on file    Gets together: Not on file    Attends religious service: Not on file    Active member of club or organization: Not on file    Attends meetings of clubs or organizations: Not on file    Relationship status: Not on file   Intimate partner violence    Fear of current or ex partner: Not on file    Emotionally abused: Not on file    Physically abused: Not on file     Forced sexual activity:  Not on file  Other Topics Concern   Not on file  Social History Narrative   Lives with husband, Caffeine use- Half Caffeine)- 2 cups daily.  3 children living, one passed away.   Education: HS. Business college.  Retired.     Outpatient Medications Prior to Visit  Medication Sig Dispense Refill   aspirin EC 81 MG tablet Take 1 tablet (81 mg total) by mouth daily.     cholecalciferol (VITAMIN D) 1000 UNITS tablet Take 1,000 Units by mouth daily.     diclofenac sodium (VOLTAREN) 1 % GEL Apply 2 g topically 3 (three) times daily as needed. 100 g 2   isosorbide mononitrate (IMDUR) 60 MG 24 hr tablet TAKE 1/2 TABLET EVERY DAY 45 tablet 3   magnesium oxide (MAG-OX) 400 MG tablet Take 1,600 mg by mouth daily.      methylPREDNISolone (MEDROL DOSEPAK) 4 MG TBPK tablet Take as directed 21 tablet 0   metoprolol succinate (TOPROL-XL) 50 MG 24 hr tablet TAKE 1 TABLET AT BEDTIME (DOSE DECREASE) 90 tablet 1   potassium chloride SA (K-DUR,KLOR-CON) 20 MEQ tablet TAKE 1 TABLET EVERY DAY 90 tablet 1   simvastatin (ZOCOR) 20 MG tablet Take 1 tablet (20 mg total) by mouth at bedtime. 90 tablet 3   amLODipine (NORVASC) 10 MG tablet TAKE 1/2 TABLET EVERY DAY 45 tablet 0   famotidine (PEPCID) 40 MG tablet Take 1 tablet (40 mg total) by mouth at bedtime. 90 tablet 3   hyoscyamine (LEVSIN SL) 0.125 MG SL tablet Place 1 tablet (0.125 mg total) under the tongue every 4 (four) hours as needed. 30 tablet 0   losartan (COZAAR) 100 MG tablet TAKE 1 TABLET EVERY DAY 90 tablet 0   omeprazole (PRILOSEC) 20 MG capsule TAKE 1 CAPSULE EVERY DAY AS NEEDED 90 capsule 1   omeprazole (PRILOSEC) 40 MG capsule Take 40 mg by mouth daily.     pramipexole (MIRAPEX) 0.25 MG tablet TAKE 1 TABLET TWICE DAILY 180 tablet 0   No facility-administered medications prior to visit.     Allergies  Allergen Reactions   Nitrofurantoin Rash   Baclofen Other (See Comments)    Hyperactivity      Review of Systems  Constitutional: Negative for fever and malaise/fatigue.  HENT: Negative for congestion.   Eyes: Negative for blurred vision.  Respiratory: Negative for shortness of breath.   Cardiovascular: Negative for chest pain, palpitations and leg swelling.  Gastrointestinal: Negative for abdominal pain, blood in stool and nausea.  Genitourinary: Negative for dysuria and frequency.  Musculoskeletal: Positive for joint pain. Negative for falls.  Skin: Negative for rash.  Neurological: Negative for dizziness, loss of consciousness and headaches.  Endo/Heme/Allergies: Negative for environmental allergies.  Psychiatric/Behavioral: Negative for depression. The patient is not nervous/anxious.        Objective:    Physical Exam Constitutional:      Appearance: Normal appearance. She is not ill-appearing.  HENT:     Head: Normocephalic and atraumatic.     Nose: Nose normal.  Eyes:     General:        Right eye: No discharge.        Left eye: No discharge.  Pulmonary:     [place]: Pulmonary effort is normal.  Neurological:     Mental Status: She is alert and oriented to person, place, and time.  Psychiatric:        Behavior: Behavior normal.     BP (!) 134/52    Pulse  61    Temp (!) 97.3 F (36.3 C) (Oral)    Wt 162 lb (73.5 kg)    SpO2 97%    BMI 28.70 kg/m  Wt Readings from Last 3 Encounters:  03/06/19 162 lb (73.5 kg)  12/02/18 162 lb (73.5 kg)  11/18/18 164 lb (74.4 kg)    Diabetic Foot Exam - Simple   No data filed     Lab Results  Component Value Date   WBC 6.5 05/30/2018   HGB 13.7 05/30/2018   HCT 40.5 05/30/2018   PLT 159.0 05/30/2018   GLUCOSE 137 (H) 05/30/2018   CHOL 152 05/30/2018   TRIG 125.0 05/30/2018   HDL 59.80 05/30/2018   LDLDIRECT 73.4 05/20/2014   LDLCALC 67 05/30/2018   ALT 16 05/30/2018   AST 18 05/30/2018   NA 141 05/30/2018   K 4.2 05/30/2018   CL 103 05/30/2018   CREATININE 1.01 05/30/2018   BUN 23 05/30/2018   CO2 30  05/30/2018   TSH 2.08 05/30/2018   INR 0.98 11/16/2014   HGBA1C 7.0 (H) 05/30/2018   MICROALBUR <0.7 05/30/2018    Lab Results  Component Value Date   TSH 2.08 05/30/2018   Lab Results  Component Value Date   WBC 6.5 05/30/2018   HGB 13.7 05/30/2018   HCT 40.5 05/30/2018   MCV 93.0 05/30/2018   PLT 159.0 05/30/2018   Lab Results  Component Value Date   NA 141 05/30/2018   K 4.2 05/30/2018   CO2 30 05/30/2018   GLUCOSE 137 (H) 05/30/2018   BUN 23 05/30/2018   CREATININE 1.01 05/30/2018   BILITOT 0.6 05/30/2018   ALKPHOS 78 05/30/2018   AST 18 05/30/2018   ALT 16 05/30/2018   PROT 6.2 05/30/2018   ALBUMIN 4.3 05/30/2018   CALCIUM 9.3 05/30/2018   ANIONGAP 9 10/22/2016   GFR 55.82 (L) 05/30/2018   Lab Results  Component Value Date   CHOL 152 05/30/2018   Lab Results  Component Value Date   HDL 59.80 05/30/2018   Lab Results  Component Value Date   LDLCALC 67 05/30/2018   Lab Results  Component Value Date   TRIG 125.0 05/30/2018   Lab Results  Component Value Date   CHOLHDL 3 05/30/2018   Lab Results  Component Value Date   HGBA1C 7.0 (H) 05/30/2018       Assessment & Plan:   Problem List Items Addressed This Visit    Essential hypertension    Well controlled, no changes to meds. Encouraged heart healthy diet such as the DASH diet and exercise as tolerated.       Relevant Medications   losartan (COZAAR) 100 MG tablet   amLODipine (NORVASC) 10 MG tablet   Renal insufficiency    Hydrate and monitor      DM (diabetes mellitus) ([place])    hgba1c acceptable, minimize simple carbs. Increase exercise as tolerated. Continue current meds      Relevant Medications   losartan (COZAAR) 100 MG tablet   RLS (restless legs syndrome)   Relevant Medications   pramipexole (MIRAPEX) 0.25 MG tablet   Left foot pain    She has had trouble with her left ankle/foot but dropped cinder block on it this year and now is in a walking boot and following with Dr  Earleen Newport of podiatry.       Rectocele    She describes sudden onset of protrusion no incontinence. Will refer to gyn for evaluation  I have changed Elwyn M. Derderian's losartan, amLODipine, and pramipexole. I am also having her maintain her magnesium oxide, cholecalciferol, aspirin EC, potassium chloride SA, metoprolol succinate, isosorbide mononitrate, simvastatin, diclofenac sodium, methylPREDNISolone, hyoscyamine, famotidine, and omeprazole.  Meds ordered this encounter  Medications   losartan (COZAAR) 100 MG tablet    Sig: Take 1 tablet (100 mg total) by mouth daily.    Dispense:  90 tablet    Refill:  1   hyoscyamine (LEVSIN SL) 0.125 MG SL tablet    Sig: Place 1 tablet (0.125 mg total) under the tongue every 4 (four) hours as needed.    Dispense:  30 tablet    Refill:  0   amLODipine (NORVASC) 10 MG tablet    Sig: Take 0.5 tablets (5 mg total) by mouth daily.    Dispense:  45 tablet    Refill:  1   pramipexole (MIRAPEX) 0.25 MG tablet    Sig: Take 1 tablet (0.25 mg total) by mouth 2 (two) times daily.    Dispense:  180 tablet    Refill:  1   famotidine (PEPCID) 40 MG tablet    Sig: Take 1 tablet (40 mg total) by mouth at bedtime.    Dispense:  90 tablet    Refill:  3   omeprazole (PRILOSEC) 20 MG capsule    Sig: TAKE 1 CAPSULE EVERY DAY AS NEEDED    Dispense:  90 capsule    Refill:  1     I discussed the assessment and treatment plan with the patient. The patient was provided an opportunity to ask questions and all were answered. The patient agreed with the plan and demonstrated an understanding of the instructions.   The patient was advised to call back or seek an in-person evaluation if the symptoms worsen or if the condition fails to improve as anticipated.  I provided 25 minutes of non-face-to-face time during this encounter.   [name], MD

## 2019-03-08 NOTE — Assessment & Plan Note (Signed)
She describes sudden onset of protrusion no incontinence. Will refer to gyn for evaluation

## 2019-03-17 ENCOUNTER — Encounter: Payer: Self-pay | Admitting: Family Medicine

## 2019-03-19 DIAGNOSIS — L309 Dermatitis, unspecified: Secondary | ICD-10-CM | POA: Insufficient documentation

## 2019-03-19 DIAGNOSIS — G459 Transient cerebral ischemic attack, unspecified: Secondary | ICD-10-CM | POA: Insufficient documentation

## 2019-03-19 DIAGNOSIS — E049 Nontoxic goiter, unspecified: Secondary | ICD-10-CM | POA: Insufficient documentation

## 2019-03-19 DIAGNOSIS — R5383 Other fatigue: Secondary | ICD-10-CM | POA: Insufficient documentation

## 2019-03-19 DIAGNOSIS — I82409 Acute embolism and thrombosis of unspecified deep veins of unspecified lower extremity: Secondary | ICD-10-CM | POA: Insufficient documentation

## 2019-04-02 ENCOUNTER — Ambulatory Visit: Payer: Medicare HMO | Admitting: Podiatry

## 2019-04-09 ENCOUNTER — Encounter: Payer: Self-pay | Admitting: Family Medicine

## 2019-04-15 DIAGNOSIS — N8189 Other female genital prolapse: Secondary | ICD-10-CM | POA: Diagnosis not present

## 2019-05-01 ENCOUNTER — Encounter: Payer: Self-pay | Admitting: Podiatry

## 2019-05-01 ENCOUNTER — Ambulatory Visit: Payer: Medicare HMO | Admitting: Podiatry

## 2019-05-01 ENCOUNTER — Other Ambulatory Visit: Payer: Self-pay

## 2019-05-01 DIAGNOSIS — M722 Plantar fascial fibromatosis: Secondary | ICD-10-CM | POA: Diagnosis not present

## 2019-05-01 DIAGNOSIS — M779 Enthesopathy, unspecified: Secondary | ICD-10-CM | POA: Diagnosis not present

## 2019-05-01 NOTE — Progress Notes (Signed)
Subjective: 84 year old female presents the office today for follow evaluation of left foot pain.  She said the injection was helpful.  She did wear the cam boot for about 4 weeks however she stopped wearing it.  Her husband's been in the hospital as well as rehab.  She states that the area has become tender again.  She also states that she can feel the "bursa" in the heel and that is where she is getting pain as well.  No recent injuries.  She presents today wearing regular shoes and a very old orthotic. Denies any systemic complaints such as fevers, chills, nausea, vomiting. No acute changes since last appointment, and no other complaints at this time.   Objective: AAO x3, NAD DP/PT pulses palpable bilaterally, CRT less than 3 seconds There still tenderness palpation generally on the medial aspect, navicular tuberosity.  Also discomfort on plantar medial tubercle of the calcaneus at insertion of plantar fascia.  Plantar fascia appears to be intact.  No other areas of pinpoint tenderness.  No significant edema, erythema. No open lesions or pre-ulcerative lesions.  No pain with calf compression, swelling, warmth, erythema  MRI:  IMPRESSION: 1. Tibialis posterior tendinopathy adjacent to the marker indicating site of patient's pain. Correlate clinically in assessing for tibialis posterior dysfunction. 2. Longitudinal tearing of the peroneus brevis tendon along the lateral malleolus. 3. Fluid collection tracking along the plantar subcutaneous tissues closely associated with the plantar fascia. This may represent localized bursitis. Internal fatty density along the fluid collection may reflect sheared off subcutaneous adipose tissue or simply synovitis. 4. Plantar calcaneal spur. 5. Considerable degenerative arthropathy in the midfoot and along the Lisfranc joint. 6. Abnormal attenuated ATFL, likely from a prior tear. The calcaneofibular ligament is likewise  indistinct.   Assessment: Tendinitis, plantar fasciitis left foot  Plan: -All treatment options discussed with the patient including all alternatives, risks, complications.  -Steroid injections performed today to the plantar calcaneus.  We discussed doing home stretching, icing daily.  Also discussed changing orthotics.  She is over that purchasing over-the-counter insert we discussed a superfeet or powerstep insert.  Procedure: Injection Tendon/Ligament Discussed alternatives, risks, complications and verbal consent was obtained.  Location: LEFT plantar fascia at the glabrous junction; medial approach. Skin Prep: Alcohol Injectate: 0.5cc 0.5% marcaine plain, 0.5 cc 2% lidocaine plain and, 1 cc kenalog 10. Disposition: Patient tolerated procedure well. Injection site dressed with a band-aid.  Post-injection care was discussed and return precautions discussed.    Return in about 6 weeks (around 06/12/2019).  Trula Slade DPM

## 2019-05-01 NOTE — Patient Instructions (Signed)

## 2019-05-07 ENCOUNTER — Encounter: Payer: Self-pay | Admitting: Family Medicine

## 2019-05-14 DIAGNOSIS — Z961 Presence of intraocular lens: Secondary | ICD-10-CM | POA: Diagnosis not present

## 2019-05-14 DIAGNOSIS — H52223 Regular astigmatism, bilateral: Secondary | ICD-10-CM | POA: Diagnosis not present

## 2019-05-14 DIAGNOSIS — R7309 Other abnormal glucose: Secondary | ICD-10-CM | POA: Diagnosis not present

## 2019-05-14 DIAGNOSIS — H5203 Hypermetropia, bilateral: Secondary | ICD-10-CM | POA: Diagnosis not present

## 2019-05-14 DIAGNOSIS — H43813 Vitreous degeneration, bilateral: Secondary | ICD-10-CM | POA: Diagnosis not present

## 2019-05-14 DIAGNOSIS — H524 Presbyopia: Secondary | ICD-10-CM | POA: Diagnosis not present

## 2019-05-14 LAB — HM DIABETES EYE EXAM

## 2019-05-17 ENCOUNTER — Other Ambulatory Visit: Payer: Self-pay | Admitting: Cardiovascular Disease

## 2019-05-21 ENCOUNTER — Telehealth: Payer: Self-pay | Admitting: Family Medicine

## 2019-05-21 NOTE — Telephone Encounter (Signed)
Called to offer condolences over the loss of her husband and let her know if she needs anything to let us know. She reports she is managing at the moment and will contact us if we need anything.

## 2019-06-08 DIAGNOSIS — R102 Pelvic and perineal pain: Secondary | ICD-10-CM | POA: Diagnosis not present

## 2019-06-08 DIAGNOSIS — N8189 Other female genital prolapse: Secondary | ICD-10-CM | POA: Diagnosis not present

## 2019-06-15 ENCOUNTER — Other Ambulatory Visit: Payer: Self-pay

## 2019-06-15 ENCOUNTER — Ambulatory Visit: Payer: Medicare HMO | Admitting: Podiatry

## 2019-06-15 DIAGNOSIS — M79672 Pain in left foot: Secondary | ICD-10-CM

## 2019-06-15 DIAGNOSIS — M722 Plantar fascial fibromatosis: Secondary | ICD-10-CM | POA: Diagnosis not present

## 2019-06-15 DIAGNOSIS — G8929 Other chronic pain: Secondary | ICD-10-CM

## 2019-06-15 DIAGNOSIS — M779 Enthesopathy, unspecified: Secondary | ICD-10-CM | POA: Diagnosis not present

## 2019-06-21 NOTE — Progress Notes (Signed)
Subjective: 84 year old female presents the office today for follow-up evaluation of left foot pain.  She still gets pain and she points along the navicular tuberosity.  Pain does fluctuate.  She does get some pain in the bottom of her heel but most of it is localized to the medial aspect of foot that she points to. Denies any systemic complaints such as fevers, chills, nausea, vomiting. No acute changes since last appointment, and no other complaints at this time.   Her husband just recently passed away of COVID complications.  Objective: AAO x3, NAD DP/PT pulses palpable bilaterally, CRT less than 3 seconds Decrease in medial arch upon weightbearing.  Tenderness palpation mostly on the navicular tuberosity no the distal portion of the posterior tibial tendon.  Tendon appears to be intact.  No pain with Achilles tendon.  Minimal discomfort along the plantar fascial insertion.  Mild edema to the medial aspect on navicular tuberosity.  No pain with calf compression, swelling, warmth, erythema  Assessment: Insertional posterior tibial tendinitis, plantar fasciitis  Plan: -All treatment options discussed with the patient including all alternatives, risks, complications.  -Power step inserts were dispensed.  Continue with stretching and discussed physical therapy.  Voltaren gel.  Would recommend trying to hold off another injection if able. -Patient encouraged to call the office with any questions, concerns, change in symptoms.   Trula Slade DPM

## 2019-06-22 ENCOUNTER — Encounter: Payer: Self-pay | Admitting: Family Medicine

## 2019-07-13 ENCOUNTER — Encounter: Payer: Self-pay | Admitting: Podiatry

## 2019-07-13 ENCOUNTER — Other Ambulatory Visit: Payer: Self-pay

## 2019-07-13 ENCOUNTER — Ambulatory Visit: Payer: Medicare HMO | Admitting: Podiatry

## 2019-07-13 DIAGNOSIS — M779 Enthesopathy, unspecified: Secondary | ICD-10-CM

## 2019-07-13 NOTE — Progress Notes (Signed)
Subjective: 85 year old female presents the office today for follow evaluation of left foot pain.  She states that the Voltaren gel as well as inserts have been helping quite a bit.  She is not expensing any pain.  She was on her feet a lot for Christmas and she has some discomfort however over the last week she has been off of her feet more and she is not experiencing any discomfort or pain.  No swelling.  She does the exercises intermittently.  She has been wearing the power steps still. Denies any systemic complaints such as fevers, chills, nausea, vomiting. No acute changes since last appointment, and no other complaints at this time.   Objective: AAO x3, NAD DP/PT pulses palpable bilaterally, CRT less than 3 seconds At this time there is no area of pinpoint tenderness.  There is no discomfort elicited to the foot or ankle.  Flexor, extensor tendons appear to be intact.  No significant swelling. No pain with calf compression, swelling, warmth, erythema  Assessment: Left foot resolved pain, tendinitis  Plan: -All treatment options discussed with the patient including all alternatives, risks, complications.  -Continue with supportive shoes, inserts.  I would ask her to get back to doing the stretching, rehab exercises.  Ice to the area as well daily. -Follow-up as needed or if there is any recurrence. -Patient encouraged to call the office with any questions, concerns, change in symptoms.   Trula Slade DPM

## 2019-07-27 NOTE — Progress Notes (Signed)
Virtual Visit via Audio Note  I connected with patient on 07/28/19 at 10:00 AM EST by audio enabled telemedicine application and verified that I am speaking with the correct person using two identifiers.   THIS ENCOUNTER IS A VIRTUAL VISIT DUE TO COVID-19 - PATIENT WAS NOT SEEN IN THE OFFICE. PATIENT HAS CONSENTED TO VIRTUAL VISIT / TELEMEDICINE VISIT   Location of patient: home  Location of provider: office  I discussed the limitations of evaluation and management by telemedicine and the availability of in person appointments. The patient expressed understanding and agreed to proceed.   Subjective:   Heather [name] is a 85 y.o. female who presents for Medicare Annual (Subsequent) preventive examination.  Pt enjoys sewing.   Review of Systems:   Home Safety/Smoke Alarms: Feels safe in home. Smoke alarms in place.  Lives alone in 1 story home. Husband passed 05/2019. Son lives w/i a mile. Checks on her often. Dtr lives in [place] as well and also checks on her often. Has another dtr in [place].    Female:   Mammo-  declines     Dexa scan-  06/02/18       Objective:     Vitals: BP 135/70 Comment: pt reported vitals  Pulse 61   Temp 98 F (36.7 C)   SpO2 97%   There is no height or weight on file to calculate BMI.  Advanced Directives 07/28/2019 07/21/2018 04/18/2017 06/18/2016 07/01/2015 05/03/2015 01/18/2015  Does Patient Have a Medical Advance Directive? Yes Yes Yes Yes No Yes Yes  Type of Paramedic of [place];Living will [place];Living will [place];Living will [place];Living will - [place];Living will [place];Living will  Does patient want to make changes to medical advance directive? No - Patient declined No - Patient declined - No - Patient declined - - -  Copy of [place] in Chart? No - copy requested No - copy requested  - No - copy requested - - Yes  Would patient like information on creating a medical advance directive? - - - - No - patient declined information - -  Pre-existing out of facility DNR order (yellow form or pink MOST form) - - - - - - -    Tobacco Social History   Tobacco Use  Smoking Status Never Smoker  Smokeless Tobacco Never Used     Counseling given: Not Answered   Clinical Intake: Pain : No/denies pain      Past Medical History:  Diagnosis Date  . Angina   . Atrial fibrillation ([place])   . Borderline diabetes   . Cancer ([place])    cervical after hysterectomy   . Cataract    removed both eyes  . Coronary artery disease    post percutaneous coronary intervention in 2006  . Depression with anxiety 01/05/2012  . Dermatitis   . Diabetes mellitus without complication ([facility])    no meds  . Diarrhea 04/17/2015  . Diverticulitis    hx of  . DJD (degenerative joint disease) of knee 09/18/2011   knees- both, elbow- R, ankles   . DVT, lower extremity ([place])    rt. leg after knee surgery  . Dysrhythmia, cardiac    "palpitations"  . Fatigue   . Gastric ulcer    hx of  . GERD (gastroesophageal reflux disease)    prilosec daily  . H/O dizziness    pre-syncope/due to dehydration  .  H/O hiatal hernia   . Headache 04/17/2015  . Hip pain 08/28/2011  . Hx of total knee replacement 11/12/11   left knee  . Hyperlipidemia   . Hyperlipidemia associated with type 2 diabetes mellitus ([place]) 05/27/2017  . Hypertension    takes medications daily  . Medicare annual wellness visit, subsequent 11/22/2013  . Occasional tremors   . Osteoporosis    taking vitamin d  . Persistent disorder of initiating or maintaining sleep   . Restless legs    takes mirapex daily  . S/P inguinal herniorrhaphy   . Stroke [facility]) 2000   TIA- series that have resolved-  . UTI (lower urinary tract infection) 10/04/2013   Past Surgical History:  Procedure Laterality Date  . ABDOMINAL HYSTERECTOMY    . BRAIN  SURGERY     related to stroke   2000 patient unaware states she has not had  . COLONOSCOPY    . CORONARY ANGIOPLASTY  03/2005; 06/2005   "1" plus "1"= "2 total"  . EYE SURGERY  11-16-13   cataract -right eye, left eye summer 2015  . FOOT SURGERY     left foot stress fracture repair  . HERNIA REPAIR     1989- esophageal hernia   . JOINT REPLACEMENT     left  . KNEE ARTHROSCOPY     2 on left and 1 on right  . TOTAL KNEE ARTHROPLASTY  11/12/2011   Procedure: TOTAL KNEE ARTHROPLASTY;  Surgeon: Lorn Junes, MD;  Location: [place];  Service: Orthopedics;  Laterality: Left;  Dr Noemi Chapel wants 90 minutes for this case  . TOTAL KNEE ARTHROPLASTY Right 11/29/2014   Procedure: RIGHT TOTAL KNEE ARTHROPLASTY;  Surgeon: Vickey Huger, MD;  Location: [place];  Service: Orthopedics;  Laterality: Right;  . UPPER GASTROINTESTINAL ENDOSCOPY  2013   Family History  Problem Relation Age of Onset  . Stroke Father        family hx of M 1st degree relative <50  . Coronary artery disease Mother   . Heart disease Mother   . Depression Brother   . Stroke Brother   . Diabetes Brother   . Cancer Brother        bladder with mets  . Diabetes Daughter        borderline  . Hypertension Daughter   . Arthritis Other        family hx of  . Hypertension Other        family hx of  . Other Other        family hx of cardiovascular disorder  . Thyroid disease Daughter   . Breast cancer Neg Hx   . Colon cancer Neg Hx   . Anesthesia problems Neg Hx   . Hypotension Neg Hx   . Malignant hyperthermia Neg Hx   . Pseudochol deficiency Neg Hx   . Colon polyps Neg Hx   . Esophageal cancer Neg Hx   . Rectal cancer Neg Hx   . Stomach cancer Neg Hx    Social History   Socioeconomic History  . Marital status: Married    Spouse name: Not on file  . Number of children: 3  . Years of education: 31  . Highest education level: Not on file  Occupational History  . Occupation: works partime in Office manager: RETIRED      Comment: retired  Tobacco Use  . Smoking status: Never Smoker  . Smokeless tobacco: Never Used  Substance and Sexual  Activity  . Alcohol use: No    Alcohol/week: 0.0 standard drinks  . Drug use: No  . Sexual activity: Yes    Birth control/protection: Surgical    Comment: lives with husband, no dietary restrictions  Other Topics Concern  . Not on file  Social History Narrative   Lives with husband, Caffeine use- Half Caffeine)- 2 cups daily.  3 children living, one passed away.   Education: HS. Business college.  Retired.    Social Determinants of Health   Financial Resource Strain:   . Difficulty of Paying Living Expenses: Not on file  Food Insecurity:   . Worried About Charity fundraiser in the Last Year: Not on file  . Ran Out of Food in the Last Year: Not on file  Transportation Needs:   . Lack of Transportation (Medical): Not on file  . Lack of Transportation (Non-Medical): Not on file  Physical Activity:   . Days of Exercise per Week: Not on file  . Minutes of Exercise per Session: Not on file  Stress:   . Feeling of Stress : Not on file  Social Connections:   . Frequency of Communication with Friends and Family: Not on file  . Frequency of Social Gatherings with Friends and Family: Not on file  . Attends Religious Services: Not on file  . Active Member of Clubs or Organizations: Not on file  . Attends Archivist Meetings: Not on file  . Marital Status: Not on file    Outpatient Encounter Medications as of 07/28/2019  Medication Sig  . amLODipine (NORVASC) 10 MG tablet Take 0.5 tablets (5 mg total) by mouth daily.  Marland Kitchen aspirin EC 81 MG tablet Take 1 tablet (81 mg total) by mouth daily.  . cholecalciferol (VITAMIN D) 1000 UNITS tablet Take 1,000 Units by mouth daily.  . diclofenac sodium (VOLTAREN) 1 % GEL Apply 2 g topically 3 (three) times daily as needed.  . hyoscyamine (LEVSIN SL) 0.125 MG SL tablet Place 1 tablet (0.125 mg total) under the tongue  every 4 (four) hours as needed.  . isosorbide mononitrate (IMDUR) 60 MG 24 hr tablet TAKE 1/2 TABLET EVERY DAY  . losartan (COZAAR) 100 MG tablet Take 1 tablet (100 mg total) by mouth daily.  . magnesium oxide (MAG-OX) 400 MG tablet Take 1,600 mg by mouth daily.   . metoprolol succinate (TOPROL-XL) 50 MG 24 hr tablet TAKE 1 TABLET AT BEDTIME  . omeprazole (PRILOSEC) 20 MG capsule TAKE 1 CAPSULE EVERY DAY AS NEEDED  . potassium chloride SA (KLOR-CON) 20 MEQ tablet TAKE 1 TABLET EVERY DAY  . pramipexole (MIRAPEX) 0.25 MG tablet Take 1 tablet (0.25 mg total) by mouth 2 (two) times daily.  . simvastatin (ZOCOR) 20 MG tablet Take 1 tablet (20 mg total) by mouth at bedtime.  . famotidine (PEPCID) 40 MG tablet Take 1 tablet (40 mg total) by mouth at bedtime. (Patient not taking: Reported on 07/28/2019)   No facility-administered encounter medications on file as of 07/28/2019.    Activities of Daily Living In your present state of health, do you have any difficulty performing the following activities: 07/28/2019  Hearing? N  Vision? N  Difficulty concentrating or making decisions? N  Walking or climbing stairs? N  Dressing or bathing? N  Doing errands, shopping? N  Preparing Food and eating ? N  Using the Toilet? N  In the past six months, have you accidently leaked urine? Y  Do you have problems with loss  of bowel control? N  Managing your Medications? N  Managing your Finances? N  Housekeeping or managing your Housekeeping? N  Some recent data might be hidden    Patient Care Team: Mosie Lukes, MD as PCP - General (Family Medicine) Idelle Leech, OD (Optometry) Janyth Contes, MD as Consulting Physician (Obstetrics and Gynecology) [name], MD as Consulting Physician (Cardiology) Trula Slade, DPM as Consulting Physician (Podiatry)    Assessment:   This is a routine wellness examination for A[place]. Physical assessment deferred to PCP.  Exercise Activities and  Dietary recommendations Current Exercise Habits: Home exercise routine, Time (Minutes): 10, Frequency (Times/Week): 7, Weekly Exercise (Minutes/Week): 70, Intensity: Mild, Exercise limited by: None identified Diet (meal preparation, eat out, water intake, caffeinated beverages, dairy products, fruits and vegetables): well balanced      Goals    . Increase physical activity     Begin using exercise bike 3x/wk. 1 mile each time.    . Increase water intake    . Weight (lb) < 150 lb (68 kg)       Fall Risk Fall Risk  07/28/2019 07/21/2018 02/05/2018 02/05/2018 06/18/2016  Falls in the past year? 0 1 Yes Yes No  Comment - - - Emmi Telephone Survey: data to providers prior to load -  Number falls in past yr: 0 0 1 2 or more -  Comment - - - Emmi Telephone Survey Actual Response = 3 -  Injury with Fall? 0 1 - Yes -  Risk for fall due to : - - - - -  Risk for fall due to: Comment - - - - -  Follow up Education provided;Falls prevention discussed - - - -   Depression Screen PHQ 2/9 Scores 07/28/2019 07/21/2018 06/18/2016 12/22/2014  PHQ - 2 Score 1 1 0 2  PHQ- 9 Score - - - 8     Cognitive Function Ad8 score reviewed for issues:  Issues making decisions:no  Less interest in hobbies / activities:no  Repeats questions, stories (family complaining):no  Trouble using ordinary gadgets (microwave, computer, phone):no  Forgets the month or year: no  Mismanaging finances: no  Remembering appts:no  Daily problems with thinking and/or memory:no Ad8 score is=0     MMSE - Mini Mental State Exam 06/18/2016  Orientation to time 5  Orientation to Place 5  Registration 3  Attention/ Calculation 5  Recall 2  Language- name 2 objects 2  Language- repeat 1  Language- follow 3 step command 3  Language- read & follow direction 1  Write a sentence 1  Copy design 1  Total score 29        Immunization History  Administered Date(s) Administered  . Influenza Whole 06/08/2005, 06/29/2010   . Influenza, High Dose Seasonal PF 04/05/2015, 06/18/2016, 05/27/2017, 05/30/2018  . Influenza,inj,Quad PF,6+ Mos 04/30/2014  . Pneumococcal Conjugate-13 11/17/2013  . Pneumococcal Polysaccharide-23 07/09/2000, 05/27/2017  . Tdap 05/24/2015   Screening Tests Health Maintenance  Topic Date Due  . FOOT EXAM  06/18/2017  . HEMOGLOBIN A1C  11/28/2018  . INFLUENZA VACCINE  02/07/2019  . OPHTHALMOLOGY EXAM  05/13/2020  . TETANUS/TDAP  05/23/2025  . DEXA SCAN  Completed  . PNA vac Low Risk Adult  Completed      Plan:    Please schedule your next medicare wellness visit with me in 1 yr.   Continue to eat heart healthy diet (full of fruits, vegetables, whole grains, lean protein, water--limit salt, fat, and sugar intake) and  increase physical activity as tolerated.  Continue doing brain stimulating activities (puzzles, reading, adult coloring books, staying active) to keep memory sharp.     I have personally reviewed and noted the following in the patient's chart:   . Medical and social history . Use of alcohol, tobacco or illicit drugs  . Current medications and supplements . Functional ability and status . Nutritional status . Physical activity . Advanced directives . List of other physicians . Hospitalizations, surgeries, and ER visits in previous 12 months . Vitals . Screenings to include cognitive, depression, and falls . Referrals and appointments  In addition, I have reviewed and discussed with patient certain preventive protocols, quality metrics, and best practice recommendations. A written personalized care plan for preventive services as well as general preventive health recommendations were provided to patient.     Shela Nevin, [place]  07/28/2019

## 2019-07-28 ENCOUNTER — Encounter: Payer: Self-pay | Admitting: *Deleted

## 2019-07-28 ENCOUNTER — Ambulatory Visit (INDEPENDENT_AMBULATORY_CARE_PROVIDER_SITE_OTHER): Payer: Medicare HMO | Admitting: *Deleted

## 2019-07-28 ENCOUNTER — Other Ambulatory Visit: Payer: Self-pay

## 2019-07-28 VITALS — BP 135/70 | HR 61 | Temp 98.0°F

## 2019-07-28 DIAGNOSIS — Z0000 Encounter for general adult medical examination without abnormal findings: Secondary | ICD-10-CM | POA: Diagnosis not present

## 2019-07-28 NOTE — Patient Instructions (Signed)
Please schedule your next medicare wellness visit with me in 1 yr.   Continue to eat heart healthy diet (full of fruits, vegetables, whole grains, lean protein, water--limit salt, fat, and sugar intake) and increase physical activity as tolerated.  Continue doing brain stimulating activities (puzzles, reading, adult coloring books, staying active) to keep memory sharp.     Heather [name] , Thank you for taking time to come for your Medicare Wellness Visit. I appreciate your ongoing commitment to your health goals. Please review the following plan we discussed and let me know if I can assist you in the future.   These are the goals we discussed: Goals    . Increase physical activity     Begin using exercise bike 3x/wk. 1 mile each time.    . Increase water intake    . Weight (lb) < 150 lb (68 kg)       This is a list of the screening recommended for you and due dates:  Health Maintenance  Topic Date Due  . Complete foot exam   06/18/2017  . Hemoglobin A1C  11/28/2018  . Flu Shot  02/07/2019  . Eye exam for diabetics  05/13/2020  . Tetanus Vaccine  05/23/2025  . DEXA scan (bone density measurement)  Completed  . Pneumonia vaccines  Completed    Preventive Care 54 Years and Older, Female Preventive care refers to lifestyle choices and visits with your health care provider that can promote health and wellness. This includes:  A yearly physical exam. This is also called an annual well check.  Regular dental and eye exams.  Immunizations.  Screening for certain conditions.  Healthy lifestyle choices, such as diet and exercise. What can I expect for my preventive care visit? Physical exam Your health care provider will check:  Height and weight. These may be used to calculate body mass index (BMI), which is a measurement that tells if you are at a healthy weight.  Heart rate and blood pressure.  Your skin for abnormal spots. Counseling Your health care provider may ask you  questions about:  Alcohol, tobacco, and drug use.  Emotional well-being.  Home and relationship well-being.  Sexual activity.  Eating habits.  History of falls.  Memory and ability to understand (cognition).  Work and work Statistician.  Pregnancy and menstrual history. What immunizations do I need?  Influenza (flu) vaccine  This is recommended every year. Tetanus, diphtheria, and pertussis (Tdap) vaccine  You may need a Td booster every 10 years. Varicella (chickenpox) vaccine  You may need this vaccine if you have not already been vaccinated. Zoster (shingles) vaccine  You may need this after age 68. Pneumococcal conjugate (PCV13) vaccine  One dose is recommended after age 51. Pneumococcal polysaccharide (PPSV23) vaccine  One dose is recommended after age 44. Measles, mumps, and rubella (MMR) vaccine  You may need at least one dose of MMR if you were born in 1957 or later. You may also need a second dose. Meningococcal conjugate (MenACWY) vaccine  You may need this if you have certain conditions. Hepatitis A vaccine  You may need this if you have certain conditions or if you travel or work in places where you may be exposed to hepatitis A. Hepatitis B vaccine  You may need this if you have certain conditions or if you travel or work in places where you may be exposed to hepatitis B. Haemophilus influenzae type b (Hib) vaccine  You may need this if you have certain  conditions. You may receive vaccines as individual doses or as more than one vaccine together in one shot (combination vaccines). Talk with your health care provider about the risks and benefits of combination vaccines. What tests do I need? Blood tests  Lipid and cholesterol levels. These may be checked every 5 years, or more frequently depending on your overall health.  Hepatitis C test.  Hepatitis B test. Screening  Lung cancer screening. You may have this screening every year starting at  age 24 if you have a 30-pack-year history of smoking and currently smoke or have quit within the past 15 years.  Colorectal cancer screening. All adults should have this screening starting at age 42 and continuing until age 78. Your health care provider may recommend screening at age 54 if you are at increased risk. You will have tests every 1-10 years, depending on your results and the type of screening test.  Diabetes screening. This is done by checking your blood sugar (glucose) after you have not eaten for a while (fasting). You may have this done every 1-3 years.  Mammogram. This may be done every 1-2 years. Talk with your health care provider about how often you should have regular mammograms.  BRCA-related cancer screening. This may be done if you have a family history of breast, ovarian, tubal, or peritoneal cancers. Other tests  Sexually transmitted disease (STD) testing.  Bone density scan. This is done to screen for osteoporosis. You may have this done starting at age 49. Follow these instructions at home: Eating and drinking  Eat a diet that includes fresh fruits and vegetables, whole grains, lean protein, and low-fat dairy products. Limit your intake of foods with high amounts of sugar, saturated fats, and salt.  Take vitamin and mineral supplements as recommended by your health care provider.  Do not drink alcohol if your health care provider tells you not to drink.  If you drink alcohol: ? Limit how much you have to 0-1 drink a day. ? Be aware of how much alcohol is in your drink. In the [place] one drink equals one 12 oz bottle of beer (355 mL), one 5 oz glass of wine (148 mL), or one 1 oz glass of hard liquor (44 mL). Lifestyle  Take daily care of your teeth and gums.  Stay active. Exercise for at least 30 minutes on 5 or more days each week.  Do not use any products that contain nicotine or tobacco, such as cigarettes, e-cigarettes, and chewing tobacco. If you need  help quitting, ask your health care provider.  If you are sexually active, practice safe sex. Use a condom or other form of protection in order to prevent STIs (sexually transmitted infections).  Talk with your health care provider about taking a low-dose aspirin or statin. What's next?  Go to your health care provider once a year for a well check visit.  Ask your health care provider how often you should have your eyes and teeth checked.  Stay up to date on all vaccines. This information is not intended to replace advice given to you by your health care provider. Make sure you discuss any questions you have with your health care provider. Document Revised: 06/19/2018 Document Reviewed: 06/19/2018 Elsevier Patient Education  2020 [employer].

## 2019-08-05 DIAGNOSIS — N8189 Other female genital prolapse: Secondary | ICD-10-CM | POA: Diagnosis not present

## 2019-08-06 ENCOUNTER — Ambulatory Visit: Payer: Medicare HMO

## 2019-08-11 DIAGNOSIS — N8189 Other female genital prolapse: Secondary | ICD-10-CM | POA: Diagnosis not present

## 2019-08-19 ENCOUNTER — Other Ambulatory Visit: Payer: Self-pay | Admitting: Family Medicine

## 2019-08-19 DIAGNOSIS — I10 Essential (primary) hypertension: Secondary | ICD-10-CM

## 2019-10-01 DIAGNOSIS — F418 Other specified anxiety disorders: Secondary | ICD-10-CM | POA: Diagnosis not present

## 2019-10-01 DIAGNOSIS — N3946 Mixed incontinence: Secondary | ICD-10-CM | POA: Insufficient documentation

## 2019-10-01 DIAGNOSIS — N8189 Other female genital prolapse: Secondary | ICD-10-CM | POA: Diagnosis not present

## 2019-10-01 DIAGNOSIS — N3281 Overactive bladder: Secondary | ICD-10-CM | POA: Diagnosis not present

## 2019-10-06 ENCOUNTER — Other Ambulatory Visit: Payer: Self-pay | Admitting: Family Medicine

## 2019-10-06 DIAGNOSIS — G2581 Restless legs syndrome: Secondary | ICD-10-CM

## 2019-10-08 ENCOUNTER — Telehealth: Payer: Self-pay | Admitting: *Deleted

## 2019-10-08 NOTE — Telephone Encounter (Signed)
Left message to call back to schedule pre op clearance appt.

## 2019-10-08 NOTE — Telephone Encounter (Signed)
Pt has appt with [name], [facility] 10/20/19 for pre op clearance. I will forward clearance notes to [facility] for upcoming appt. I will send FYI to surgeon Dr. Thornell Sartorius (surgeon's name entered incorrectly as Dr. Pennie Banter). I will remove from the pre op call back pool.

## 2019-10-08 NOTE — Telephone Encounter (Signed)
   [facility] Pre-operative Risk Assessment    Request for surgical clearance:  1. What type of surgery is being performed? A&P REPAIR   2. When is this surgery scheduled? TBD   3. What type of clearance is required (medical clearance vs. Pharmacy clearance to hold med vs. Both)? MEDICAL  4. Are there any medications that need to be held prior to surgery and how long? ASA    5. Practice name and name of physician performing surgery? [facility]; DR. Caryl Asp BOVARD     6. What is your office phone number [phone]    7.   What is your office fax number [phone]  8.   Anesthesia type (None, local, MAC, general) ? NOT LISTED   Heather [name] 10/08/2019, 12:28 PM  _________________________________________________________________   (provider comments below)

## 2019-10-08 NOTE — Telephone Encounter (Signed)
   Primary [place], MD  Chart reviewed as part of pre-operative protocol coverage. Because of Heather [name]'s past medical history and time since last visit, he/she will require a follow-up visit in order to better assess preoperative cardiovascular risk.  Pre-op covering staff: - Please schedule appointment and call patient to inform them. - Please contact requesting surgeon's office via preferred method (i.e, phone, fax) to inform them of need for appointment prior to surgery.  If applicable, this message will also be routed to pharmacy pool and/or primary cardiologist for input on holding anticoagulant/antiplatelet agent as requested below so that this information is available at time of patient's appointment.   Cecilie Kicks, NP  10/08/2019, 1:05 PM

## 2019-10-12 ENCOUNTER — Other Ambulatory Visit: Payer: Self-pay

## 2019-10-13 ENCOUNTER — Ambulatory Visit: Payer: Medicare HMO | Admitting: Family Medicine

## 2019-10-13 ENCOUNTER — Ambulatory Visit: Payer: Self-pay

## 2019-10-13 ENCOUNTER — Encounter: Payer: Self-pay | Admitting: Family Medicine

## 2019-10-13 ENCOUNTER — Ambulatory Visit (INDEPENDENT_AMBULATORY_CARE_PROVIDER_SITE_OTHER): Payer: Medicare HMO | Admitting: Family Medicine

## 2019-10-13 ENCOUNTER — Other Ambulatory Visit: Payer: Self-pay

## 2019-10-13 VITALS — BP 160/73 | HR 60 | Ht 63.0 in | Wt 164.0 lb

## 2019-10-13 DIAGNOSIS — I10 Essential (primary) hypertension: Secondary | ICD-10-CM

## 2019-10-13 DIAGNOSIS — M7631 Iliotibial band syndrome, right leg: Secondary | ICD-10-CM

## 2019-10-13 DIAGNOSIS — N819 Female genital prolapse, unspecified: Secondary | ICD-10-CM

## 2019-10-13 DIAGNOSIS — E1169 Type 2 diabetes mellitus with other specified complication: Secondary | ICD-10-CM

## 2019-10-13 DIAGNOSIS — E785 Hyperlipidemia, unspecified: Secondary | ICD-10-CM | POA: Diagnosis not present

## 2019-10-13 DIAGNOSIS — M25551 Pain in right hip: Secondary | ICD-10-CM | POA: Diagnosis not present

## 2019-10-13 DIAGNOSIS — N289 Disorder of kidney and ureter, unspecified: Secondary | ICD-10-CM | POA: Diagnosis not present

## 2019-10-13 DIAGNOSIS — E1121 Type 2 diabetes mellitus with diabetic nephropathy: Secondary | ICD-10-CM | POA: Diagnosis not present

## 2019-10-13 DIAGNOSIS — M25559 Pain in unspecified hip: Secondary | ICD-10-CM | POA: Diagnosis not present

## 2019-10-13 LAB — COMPREHENSIVE METABOLIC PANEL
ALT: 12 U/L (ref 0–35)
AST: 15 U/L (ref 0–37)
Albumin: 4 g/dL (ref 3.5–5.2)
Alkaline Phosphatase: 83 U/L (ref 39–117)
BUN: 20 mg/dL (ref 6–23)
CO2: 29 mEq/L (ref 19–32)
Calcium: 9.1 mg/dL (ref 8.4–10.5)
Chloride: 99 mEq/L (ref 96–112)
Creatinine, Ser: 1 mg/dL (ref 0.40–1.20)
GFR: 52.95 mL/min — ABNORMAL LOW (ref >60.00)
Glucose, Bld: 145 mg/dL — ABNORMAL HIGH (ref 70–99)
Potassium: 4.4 mEq/L (ref 3.5–5.1)
Sodium: 135 mEq/L (ref 135–145)
Total Bilirubin: 0.9 mg/dL (ref 0.2–1.2)
Total Protein: 6 g/dL (ref 6.0–8.3)

## 2019-10-13 LAB — CBC
HCT: 37.8 % (ref 36.0–46.0)
Hemoglobin: 12.7 g/dL (ref 12.0–15.0)
MCHC: 33.6 g/dL (ref 30.0–36.0)
MCV: 93.1 fl (ref 78.0–100.0)
Platelets: 158 10*3/uL (ref 150.0–400.0)
RBC: 4.06 Mil/uL (ref 3.87–5.11)
RDW: 13.4 % (ref 11.5–15.5)
WBC: 7.9 10*3/uL (ref 4.0–10.5)

## 2019-10-13 LAB — LIPID PANEL
Cholesterol: 146 mg/dL (ref 0–200)
HDL: 56.9 mg/dL (ref >39.00)
LDL Cholesterol: 70 mg/dL (ref 0–99)
NonHDL: 89.53
Total CHOL/HDL Ratio: 3
Triglycerides: 99 mg/dL (ref 0.0–149.0)
VLDL: 19.8 mg/dL (ref 0.0–40.0)

## 2019-10-13 LAB — TSH: TSH: 3.17 u[IU]/mL (ref 0.35–4.50)

## 2019-10-13 LAB — HEMOGLOBIN A1C: Hgb A1c MFr Bld: 7.2 % — ABNORMAL HIGH (ref 4.6–6.5)

## 2019-10-13 MED ORDER — METHYLPREDNISOLONE ACETATE 40 MG/ML IJ SUSP
40.0000 mg | Freq: Once | INTRAMUSCULAR | Status: AC
  Administered 2019-10-13: 40 mg via INTRA_ARTICULAR

## 2019-10-13 NOTE — Patient Instructions (Signed)

## 2019-10-13 NOTE — Progress Notes (Signed)
Subjective:    Patient ID: Heather [name], female    DOB: 03-Nov-1934, 85 y.o.   MRN: [id number]  Chief Complaint  Patient presents with  . surical clearance    HPI Patient is in today for follow up on chronic medical concerns. She is feeling well. No recent febrile illness or hospitalizations. No polyuria or polydipsia. Has been maintaining quarantine well. Denies CP/palp/SOB/HA/congestion/fevers/GI c/o. Taking meds as prescribed. [facility] is planning a bladder tack and pelvic repair once she is cleared. She has an appt with cardiology next week for cardiac clearance. She is ready to proceed as her incontinence is keeping her from doing simple activities. She is trying to get her beach house ready to sell and has aggravated her hip on right, she has an appt with sports med today to manage. Tramadol is marginally helpful  Past Medical History:  Diagnosis Date  . Angina   . Atrial fibrillation ([place])   . Borderline diabetes   . Cancer ([place])    cervical after hysterectomy   . Cataract    removed both eyes  . Coronary artery disease    post percutaneous coronary intervention in 2006  . Depression with anxiety 01/05/2012  . Dermatitis   . Diabetes mellitus without complication (HCC)    no meds  . Diarrhea 04/17/2015  . Diverticulitis    hx of  . DJD (degenerative joint disease) of knee 09/18/2011   knees- both, elbow- R, ankles   . DVT, lower extremity ([place])    rt. leg after knee surgery  . Dysrhythmia, cardiac    "palpitations"  . Fatigue   . Gastric ulcer    hx of  . GERD (gastroesophageal reflux disease)    prilosec daily  . H/O dizziness    pre-syncope/due to dehydration  . H/O hiatal hernia   . Headache 04/17/2015  . Hip pain 08/28/2011  . Hx of total knee replacement 11/12/11   left knee  . Hyperlipidemia   . Hyperlipidemia associated with type 2 diabetes mellitus ([place]) 05/27/2017  . Hypertension    takes medications daily  . Medicare annual wellness  visit, subsequent 11/22/2013  . Occasional tremors   . Osteoporosis    taking vitamin d  . Persistent disorder of initiating or maintaining sleep   . Restless legs    takes mirapex daily  . S/P inguinal herniorrhaphy   . Stroke [facility]) 2000   TIA- series that have resolved-  . UTI (lower urinary tract infection) 10/04/2013    Past Surgical History:  Procedure Laterality Date  . ABDOMINAL HYSTERECTOMY    . BRAIN SURGERY     related to stroke   2000 patient unaware states she has not had  . COLONOSCOPY    . CORONARY ANGIOPLASTY  03/2005; 06/2005   "1" plus "1"= "2 total"  . EYE SURGERY  11-16-13   cataract -right eye, left eye summer 2015  . FOOT SURGERY     left foot stress fracture repair  . HERNIA REPAIR     1989- esophageal hernia   . JOINT REPLACEMENT     left  . KNEE ARTHROSCOPY     2 on left and 1 on right  . TOTAL KNEE ARTHROPLASTY  11/12/2011   Procedure: TOTAL KNEE ARTHROPLASTY;  Surgeon: Lorn Junes, MD;  Location: [place];  Service: Orthopedics;  Laterality: Left;  Dr Noemi Chapel wants 90 minutes for this case  . TOTAL KNEE ARTHROPLASTY Right 11/29/2014   Procedure: RIGHT TOTAL  KNEE ARTHROPLASTY;  Surgeon: Vickey Huger, MD;  Location: [place];  Service: Orthopedics;  Laterality: Right;  . UPPER GASTROINTESTINAL ENDOSCOPY  2013    Family History  Problem Relation Age of Onset  . Stroke Father        family hx of M 1st degree relative <50  . Coronary artery disease Mother   . Heart disease Mother   . Depression Brother   . Stroke Brother   . Diabetes Brother   . Cancer Brother        bladder with mets  . Diabetes Daughter        borderline  . Hypertension Daughter   . Arthritis Other        family hx of  . Hypertension Other        family hx of  . Other Other        family hx of cardiovascular disorder  . Thyroid disease Daughter   . Breast cancer Neg Hx   . Colon cancer Neg Hx   . Anesthesia problems Neg Hx   . Hypotension Neg Hx   . Malignant hyperthermia Neg  Hx   . Pseudochol deficiency Neg Hx   . Colon polyps Neg Hx   . Esophageal cancer Neg Hx   . Rectal cancer Neg Hx   . Stomach cancer Neg Hx     Social History   Socioeconomic History  . Marital status: Married    Spouse name: Not on file  . Number of children: 3  . Years of education: 60  . Highest education level: Not on file  Occupational History  . 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 Smoking status: Never Smoker  . Smokeless tobacco: Never Used  Substance and Sexual Activity  . Alcohol use: No    Alcohol/week: 0.0 standard drinks  . Drug use: No  . Sexual activity: Yes    Birth control/protection: Surgical    Comment: lives with husband, no dietary restrictions  Other Topics Concern  . Not on file  Social History Narrative   Lives with husband, Caffeine use- Half Caffeine)- 2 cups daily.  3 children living, one passed away.   Education: HS. Business college.  Retired.    Social Determinants of Health   Financial Resource Strain: Low Risk   . Difficulty of Paying Living Expenses: Not hard at all  Food Insecurity: No Food Insecurity  . Worried About Charity fundraiser in the Last Year: Never true  . Ran Out of Food in the Last Year: Never true  Transportation Needs: No Transportation Needs  . Lack of Transportation (Medical): No  . Lack of Transportation (Non-Medical): No  Physical Activity:   . Days of Exercise per Week:   . Minutes of Exercise per Session:   Stress:   . Feeling of Stress :   Social Connections:   . Frequency of Communication with Friends and Family:   . Frequency of Social Gatherings with Friends and Family:   . Attends Religious Services:   . Active Member of Clubs or Organizations:   . Attends Archivist Meetings:   Marland Kitchen Marital Status:   Intimate Partner Violence:   . Fear of Current or Ex-Partner:   . Emotionally Abused:   Marland Kitchen Physically Abused:   . Sexually Abused:      Outpatient Medications Prior to Visit  Medication Sig Dispense Refill  . amLODipine (NORVASC)  10 MG tablet TAKE 1/2 TABLET (5 MG) DAILY 45 tablet 1  . aspirin EC 81 MG tablet Take 1 tablet (81 mg total) by mouth daily.    . cholecalciferol (VITAMIN D) 1000 UNITS tablet Take 1,000 Units by mouth daily.    . diclofenac sodium (VOLTAREN) 1 % GEL Apply 2 g topically 3 (three) times daily as needed. 100 g 2  . famotidine (PEPCID) 40 MG tablet Take 1 tablet (40 mg total) by mouth at bedtime. 90 tablet 3  . hyoscyamine (LEVSIN SL) 0.125 MG SL tablet Place 1 tablet (0.125 mg total) under the tongue every 4 (four) hours as needed. 30 tablet 0  . isosorbide mononitrate (IMDUR) 60 MG 24 hr tablet TAKE 1/2 TABLET EVERY DAY 45 tablet 3  . losartan (COZAAR) 100 MG tablet TAKE 1 TABLET EVERY DAY 90 tablet 1  . magnesium oxide (MAG-OX) 400 MG tablet Take 1,600 mg by mouth daily.     . metoprolol succinate (TOPROL-XL) 50 MG 24 hr tablet TAKE 1 TABLET AT BEDTIME 90 tablet 1  . omeprazole (PRILOSEC) 20 MG capsule TAKE 1 CAPSULE EVERY DAY AS NEEDED 90 capsule 1  . potassium chloride SA (KLOR-CON) 20 MEQ tablet TAKE 1 TABLET EVERY DAY 90 tablet 1  . pramipexole (MIRAPEX) 0.25 MG tablet TAKE 1 TABLET TWICE DAILY 180 tablet 1  . simvastatin (ZOCOR) 20 MG tablet Take 1 tablet (20 mg total) by mouth at bedtime. 90 tablet 3   No facility-administered medications prior to visit.    Allergies  Allergen Reactions  . Nitrofurantoin Rash  . Baclofen Other (See Comments)    Hyperactivity     Review of Systems  Constitutional: Negative for fever and malaise/fatigue.  HENT: Negative for congestion.   Eyes: Negative for blurred vision.  Respiratory: Negative for shortness of breath.   Cardiovascular: Negative for chest pain, palpitations and leg swelling.  Gastrointestinal: Negative for abdominal pain, blood in stool and nausea.  Genitourinary: Positive for frequency and urgency. Negative for dysuria, flank pain  and hematuria.  Musculoskeletal: Positive for joint pain. Negative for falls.  Skin: Negative for rash.  Neurological: Negative for dizziness, loss of consciousness and headaches.  Endo/Heme/Allergies: Negative for environmental allergies.  Psychiatric/Behavioral: Negative for depression. The patient is not nervous/anxious.        Objective:    Physical Exam Constitutional:      General: She is not in acute distress.    Appearance: She is not diaphoretic.  HENT:     Head: Normocephalic and atraumatic.     Right Ear: External ear normal.     Left Ear: External ear normal.     Nose: Nose normal.     Mouth/Throat:     Pharynx: No oropharyngeal exudate.  Eyes:     General: No scleral icterus.       Right eye: No discharge.        Left eye: No discharge.     Conjunctiva/sclera: Conjunctivae normal.     Pupils: Pupils are equal, round, and reactive to light.  Neck:     Thyroid: No thyromegaly.  Cardiovascular:     Rate and Rhythm: Normal rate and regular rhythm.     Heart sounds: Normal heart sounds. No murmur.  Pulmonary:     Effort: Pulmonary effort is normal. No respiratory distress.     Breath sounds: Normal breath sounds. No wheezing or rales.  Abdominal:     General: Bowel sounds are normal. There is no distension.  Palpations: Abdomen is soft. There is no mass.     Tenderness: There is no abdominal tenderness.  Musculoskeletal:        General: No tenderness. Normal range of motion.     Cervical back: Normal range of motion and neck supple.  Lymphadenopathy:     Cervical: No cervical adenopathy.  Skin:    General: Skin is warm and dry.     Findings: No rash.  Neurological:     Mental Status: She is alert and oriented to person, place, and time.     Cranial Nerves: No cranial nerve deficit.     Coordination: Coordination normal.     Deep Tendon Reflexes: Reflexes are normal and symmetric. Reflexes normal.     BP (!) 133/45 (BP Location: Left Arm, Cuff Size:  Normal)   Pulse 64   Temp 98.4 F (36.9 C) (Temporal)   Resp 12   Ht 5\' 3"  (1.6 m)   Wt 167 lb 12.8 oz (76.1 kg)   SpO2 98%   BMI 29.72 kg/m  Wt Readings from Last 3 Encounters:  10/13/19 167 lb 12.8 oz (76.1 kg)  03/06/19 162 lb (73.5 kg)  12/02/18 162 lb (73.5 kg)    Diabetic Foot Exam - Simple   No data filed     Lab Results  Component Value Date   WBC 6.5 05/30/2018   HGB 13.7 05/30/2018   HCT 40.5 05/30/2018   PLT 159.0 05/30/2018   GLUCOSE 137 (H) 05/30/2018   CHOL 152 05/30/2018   TRIG 125.0 05/30/2018   HDL 59.80 05/30/2018   LDLDIRECT 73.4 05/20/2014   LDLCALC 67 05/30/2018   ALT 16 05/30/2018   AST 18 05/30/2018   NA 141 05/30/2018   K 4.2 05/30/2018   CL 103 05/30/2018   CREATININE 1.01 05/30/2018   BUN 23 05/30/2018   CO2 30 05/30/2018   TSH 2.08 05/30/2018   INR 0.98 11/16/2014   HGBA1C 7.0 (H) 05/30/2018   MICROALBUR <0.7 05/30/2018    Lab Results  Component Value Date   TSH 2.08 05/30/2018   Lab Results  Component Value Date   WBC 6.5 05/30/2018   HGB 13.7 05/30/2018   HCT 40.5 05/30/2018   MCV 93.0 05/30/2018   PLT 159.0 05/30/2018   Lab Results  Component Value Date   NA 141 05/30/2018   K 4.2 05/30/2018   CO2 30 05/30/2018   GLUCOSE 137 (H) 05/30/2018   BUN 23 05/30/2018   CREATININE 1.01 05/30/2018   BILITOT 0.6 05/30/2018   ALKPHOS 78 05/30/2018   AST 18 05/30/2018   ALT 16 05/30/2018   PROT 6.2 05/30/2018   ALBUMIN 4.3 05/30/2018   CALCIUM 9.3 05/30/2018   ANIONGAP 9 10/22/2016   GFR 55.82 (L) 05/30/2018   Lab Results  Component Value Date   CHOL 152 05/30/2018   Lab Results  Component Value Date   HDL 59.80 05/30/2018   Lab Results  Component Value Date   LDLCALC 67 05/30/2018   Lab Results  Component Value Date   TRIG 125.0 05/30/2018   Lab Results  Component Value Date   CHOLHDL 3 05/30/2018   Lab Results  Component Value Date   HGBA1C 7.0 (H) 05/30/2018       Assessment & Plan:   Problem  List Items Addressed This Visit    Essential hypertension    Well controlled, no changes to meds. Encouraged heart healthy diet such as the DASH diet and exercise as tolerated.  Relevant Orders   CBC   Comprehensive metabolic panel   TSH   Hip pain    Right hip is aggravated and pain radiates to knee at times. . She is trying to get her beach house ready to sell and has aggravated her hip on right, she has an appt with sports med today to manage. Tramadol is marginally helpful       Renal insufficiency    Hydrate and monitor       DM (diabetes mellitus) ([place])    hgba1c acceptable, minimize simple carbs. Increase exercise as tolerated. Continue current meds      Relevant Orders   Hemoglobin A1c   Hyperlipidemia associated with type 2 diabetes mellitus ([place])    Tolerating statin, encouraged heart healthy diet, avoid trans fats, minimize simple carbs and saturated fats. Increase exercise as tolerated      Relevant Orders   Lipid panel   Pelvic prolapse    Patient in for preop clearance. She has failed conservative management and is ready for surgery with [place] she is medically cleared today and cardiac clearance next week on 10/1319 with [facility].          I am having Heather [name] maintain her magnesium oxide, cholecalciferol, aspirin EC, isosorbide mononitrate, simvastatin, diclofenac sodium, hyoscyamine, famotidine, omeprazole, metoprolol succinate, potassium chloride SA, amLODipine, losartan, and pramipexole.  No orders of the defined types were placed in this encounter.    [name], MD

## 2019-10-13 NOTE — Assessment & Plan Note (Signed)
hgba1c acceptable, minimize simple carbs. Increase exercise as tolerated. Continue current meds 

## 2019-10-13 NOTE — Patient Instructions (Addendum)
Nice to meet you Please try ice Please try the exercises  Please let me know if you would like to try physical therapy Please try the pennsaid    Please send me a message in MyChart with any questions or updates.  Please see me back in 4 weeks.   --Dr. Raeford Razor

## 2019-10-13 NOTE — Assessment & Plan Note (Signed)
Well controlled, no changes to meds. Encouraged heart healthy diet such as the DASH diet and exercise as tolerated.  °

## 2019-10-13 NOTE — Assessment & Plan Note (Signed)
Tolerating statin, encouraged heart healthy diet, avoid trans fats, minimize simple carbs and saturated fats. Increase exercise as tolerated 

## 2019-10-13 NOTE — Progress Notes (Signed)
Heather [name] - 85 y.o. female MRN [id number]  Date of birth: 05-Oct-1934  SUBJECTIVE:  Including CC & ROS.  Chief Complaint  Patient presents with  . Hip Pain    right hip    Heather [name] is a 85 y.o. female that is presenting with acute on chronic right hip pain.  The pain is over the greater trochanter as well as the lateral aspect of the knee.  Pain is gotten worse over the past 2 weeks.  She has been doing more activity recently as she is trying to sell her house.  She denies any specific inciting event such as a trauma or injury.  She did fall back in 2019.  Independent review of the AP pelvis and right hip x-ray from 2019 shows degenerative changes of the joint as well as spurring over the greater trochanter.   Review of Systems See HPI   HISTORY: Past Medical, Surgical, Social, and Family History Reviewed & Updated per EMR.   Pertinent Historical Findings include:  Past Medical History:  Diagnosis Date  . Angina   . Atrial fibrillation ([place])   . Borderline diabetes   . Cancer ([place])    cervical after hysterectomy   . Cataract    removed both eyes  . Coronary artery disease    post percutaneous coronary intervention in 2006  . Depression with anxiety 01/05/2012  . Dermatitis   . Diabetes mellitus without complication (HCC)    no meds  . Diarrhea 04/17/2015  . Diverticulitis    hx of  . DJD (degenerative joint disease) of knee 09/18/2011   knees- both, elbow- R, ankles   . DVT, lower extremity ([place])    rt. leg after knee surgery  . Dysrhythmia, cardiac    "palpitations"  . Fatigue   . Gastric ulcer    hx of  . GERD (gastroesophageal reflux disease)    prilosec daily  . H/O dizziness    pre-syncope/due to dehydration  . H/O hiatal hernia   . Headache 04/17/2015  . Hip pain 08/28/2011  . Hx of total knee replacement 11/12/11   left knee  . Hyperlipidemia   . Hyperlipidemia associated with type 2 diabetes mellitus ([place]) 05/27/2017  . Hypertension    takes  medications daily  . Medicare annual wellness visit, subsequent 11/22/2013  . Occasional tremors   . Osteoporosis    taking vitamin d  . Persistent disorder of initiating or maintaining sleep   . Restless legs    takes mirapex daily  . S/P inguinal herniorrhaphy   . Stroke [facility]) 2000   TIA- series that have resolved-  . UTI (lower urinary tract infection) 10/04/2013    Past Surgical History:  Procedure Laterality Date  . ABDOMINAL HYSTERECTOMY    . BRAIN SURGERY     related to stroke   2000 patient unaware states she has not had  . COLONOSCOPY    . CORONARY ANGIOPLASTY  03/2005; 06/2005   "1" plus "1"= "2 total"  . EYE SURGERY  11-16-13   cataract -right eye, left eye summer 2015  . FOOT SURGERY     left foot stress fracture repair  . HERNIA REPAIR     1989- esophageal hernia   . JOINT REPLACEMENT     left  . KNEE ARTHROSCOPY     2 on left and 1 on right  . TOTAL KNEE ARTHROPLASTY  11/12/2011   Procedure: TOTAL KNEE ARTHROPLASTY;  Surgeon: Lorn Junes, MD;  Location:  [place] [facility];  Service: Orthopedics;  Laterality: Left;  Dr Noemi Chapel wants 90 minutes for this case  . TOTAL KNEE ARTHROPLASTY Right 11/29/2014   Procedure: RIGHT TOTAL KNEE ARTHROPLASTY;  Surgeon: Vickey Huger, MD;  Location: [place];  Service: Orthopedics;  Laterality: Right;  . UPPER GASTROINTESTINAL ENDOSCOPY  2013    Family History  Problem Relation Age of Onset  . Stroke Father        family hx of M 1st degree relative <50  . Coronary artery disease Mother   . Heart disease Mother   . Depression Brother   . Stroke Brother   . Diabetes Brother   . Cancer Brother        bladder with mets  . Diabetes Daughter        borderline  . Hypertension Daughter   . Arthritis Other        family hx of  . Hypertension Other        family hx of  . Other Other        family hx of cardiovascular disorder  . Thyroid disease Daughter   . Breast cancer Neg Hx   . Colon cancer Neg Hx   . Anesthesia problems Neg Hx   .  Hypotension Neg Hx   . Malignant hyperthermia Neg Hx   . Pseudochol deficiency Neg Hx   . Colon polyps Neg Hx   . Esophageal cancer Neg Hx   . Rectal cancer Neg Hx   . Stomach cancer Neg Hx     Social History   Socioeconomic History  . Marital status: Married    Spouse name: Not on file  . Number of children: 3  . Years of education: 56  . Highest education level: Not on file  Occupational History  . 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 Smoking status: Never Smoker  . Smokeless tobacco: Never Used  Substance and Sexual Activity  . Alcohol use: No    Alcohol/week: 0.0 standard drinks  . Drug use: No  . Sexual activity: Yes    Birth control/protection: Surgical    Comment: lives with husband, no dietary restrictions  Other Topics Concern  . Not on file  Social History Narrative   Lives with husband, Caffeine use- Half Caffeine)- 2 cups daily.  3 children living, one passed away.   Education: HS. Business college.  Retired.    Social Determinants of Health   Financial Resource Strain: Low Risk   . Difficulty of Paying Living Expenses: Not hard at all  Food Insecurity: No Food Insecurity  . Worried About Charity fundraiser in the Last Year: Never true  . Ran Out of Food in the Last Year: Never true  Transportation Needs: No Transportation Needs  . Lack of Transportation (Medical): No  . Lack of Transportation (Non-Medical): No  Physical Activity:   . Days of Exercise per Week:   . Minutes of Exercise per Session:   Stress:   . Feeling of Stress :   Social Connections:   . Frequency of Communication with Friends and Family:   . Frequency of Social Gatherings with Friends and Family:   . Attends Religious Services:   . Active Member of Clubs or Organizations:   . Attends Archivist Meetings:   Marland Kitchen Marital Status:   Intimate Partner Violence:   . Fear of Current or Ex-Partner:   . Emotionally Abused:    .  Physically Abused:   . Sexually Abused:      PHYSICAL EXAM:  VS: BP (!) 160/73   Pulse 60   Ht 5\' 3"  (1.6 m)   Wt 164 lb (74.4 kg)   BMI 29.05 kg/m  Physical Exam Gen: NAD, alert, cooperative with exam, well-appearing MSK:  Right hip: Normal internal and external rotation. Tenderness to palpation over the greater trochanter. Tenderness palpation over the SI joint. Normal strength resistance with flexion. Weakness with hip abduction. Positive Noble's test. No effusion of the knee. Neurovascularly intact   Aspiration/Injection Procedure Note Heather [name] 03/13/1935  Procedure: Injection Indications: Right hip pain  Procedure Details Consent: Risks of procedure as well as the alternatives and risks of each were explained to the (patient/caregiver).  Consent for procedure obtained. Time Out: Verified patient identification, verified procedure, site/side was marked, verified correct patient position, special equipment/implants available, medications/allergies/relevent history reviewed, required imaging and test results available.  Performed.  The area was cleaned with iodine and alcohol swabs.    The right greater trochanteric bursa was injected using 1 cc's of 40 mg Depo-Medrol and 4 cc's of 0.25% bupivacaine with a 21 2" needle.  Ultrasound was used. Images were obtained in short views showing the injection.     A sterile dressing was applied.  Patient did tolerate procedure well.     ASSESSMENT & PLAN:   It band syndrome, right Appears to have a component of IT band syndrome.  Likely related to weakness with hip abduction. -Counseled on home exercise therapy and supportive care. -Provided Pennsaid samples. -Could consider physical therapy.  Greater trochanteric pain syndrome of right lower extremity Has bursitis observed on ultrasound as well as degenerative changes at the insertion of the glute medius.  Does not appear to be intra-articular at this time.   May also have a component of SI joint dysfunction on same side. -Injection today. -Counseled on home exercise therapy and supportive care. -Could consider SI joint injection or hip injection if pain is ongoing. -Could consider referral to physical therapy.

## 2019-10-13 NOTE — Assessment & Plan Note (Addendum)
Appears to have a component of IT band syndrome.  Likely related to weakness with hip abduction. -Counseled on home exercise therapy and supportive care. -Provided Pennsaid samples. -Could consider physical therapy.

## 2019-10-13 NOTE — Assessment & Plan Note (Addendum)
Has bursitis observed on ultrasound as well as degenerative changes at the insertion of the glute medius.  Does not appear to be intra-articular at this time.  May also have a component of SI joint dysfunction on same side. -Injection today. -Counseled on home exercise therapy and supportive care. -Could consider SI joint injection or hip injection if pain is ongoing. -Could consider referral to physical therapy.

## 2019-10-13 NOTE — Assessment & Plan Note (Signed)
Hydrate and monitor 

## 2019-10-13 NOTE — Progress Notes (Signed)
Medication Samples have been provided to the patient.  Drug name: Pennsaid       Strength: 2%        Qty: 2 Boxes  [id number]  Exp.Date: 05/2020  Dosing instructions: Use a pea size amount and rub gently.  The patient has been instructed regarding the correct time, dose, and frequency of taking this medication, including desired effects and most common side effects.   Sherrie George, [place] 11:36 AM 10/13/2019

## 2019-10-13 NOTE — Assessment & Plan Note (Addendum)
Patient in for preop clearance. She has failed conservative management and is ready for surgery with [place] she is medically cleared today and cardiac clearance next week on 10/1319 with [facility].

## 2019-10-13 NOTE — Assessment & Plan Note (Signed)
Right hip is aggravated and pain radiates to knee at times. . She is trying to get her beach house ready to sell and has aggravated her hip on right, she has an appt with sports med today to manage. Tramadol is marginally helpful

## 2019-10-15 ENCOUNTER — Other Ambulatory Visit: Payer: Self-pay | Admitting: Cardiovascular Disease

## 2019-10-19 NOTE — Progress Notes (Signed)
Cardiology Office Note:    Date:  10/20/2019   ID:  Heather [name], DOB 1935-02-20, MRN [id number]  PCP:  Mosie Lukes, MD  Cardiologist:  [name], MD  Electrophysiologist:  None   Referring MD: Mosie Lukes, MD   Chief Complaint:  Surgical Clearance    Patient Profile:    Heather [name] is a 85 y.o. female with:   Coronary artery disease   S/p Taxus DES to LCx 9/06  S/p Taxus DES to RCA 12/06  Residual dz 12/06: mLAD 70-80  Borderline diabetes mellitus   GERD   Hypertension   Hyperlipidemia   Hx of CVA  Hx of post op DVT (after TKR) in 2016; Rivaroxaban x 6 mos  Prior CV studies: Carotid US 11/17/15 Bilat ICA 1-39  ETT Echocardiogram 10/24/11 Normal   Myoview 08/03/09 EF 70, no scar or ischemia  Cardiac catheterization 06/28/15 LM 20 LAD mid 70-80; Dx ost 75 OM stent patent RCA mid 70-80 EF 55 PCI:  Taxus DES to RCA  Myoview 04/25/05 No ischemia or scar, EF 70   History of Present Illness:    Heather [name] was last seen in 11/2018 by Dr. Burt Knack via Telemedicine.  She returns for surgical clearance.  She needs bladder surgery for prolapse with Dr. Melba Coon.   She is here alone.  She has been doing well without chest discomfort, significant shortness of breath, orthopnea, lower extremity swelling or syncope.  She does have a history of orthostasis.  This is improved since she had some of her blood pressure medicines reduced in the past.  She mainly only feels lightheaded if she lays completely flat and then sits up.  She remains quite active.  She rides a stationary bike or walks on a treadmill several times a week.  She does her own housework and does yard work without difficulty.  Past Medical History:  Diagnosis Date  . Angina   . Atrial fibrillation ([place])   . Borderline diabetes   . Cancer ([place])    cervical after hysterectomy   . Cataract    removed both eyes  . Coronary artery disease    post percutaneous coronary intervention in 2006    . Depression with anxiety 01/05/2012  . Dermatitis   . Diabetes mellitus without complication (HCC)    no meds  . Diarrhea 04/17/2015  . Diverticulitis    hx of  . DJD (degenerative joint disease) of knee 09/18/2011   knees- both, elbow- R, ankles   . DVT, lower extremity ([place])    rt. leg after knee surgery  . Dysrhythmia, cardiac    "palpitations"  . Fatigue   . Gastric ulcer    hx of  . GERD (gastroesophageal reflux disease)    prilosec daily  . H/O dizziness    pre-syncope/due to dehydration  . H/O hiatal hernia   . Headache 04/17/2015  . Hip pain 08/28/2011  . Hx of total knee replacement 11/12/11   left knee  . Hyperlipidemia   . Hyperlipidemia associated with type 2 diabetes mellitus ([place]) 05/27/2017  . Hypertension    takes medications daily  . Medicare annual wellness visit, subsequent 11/22/2013  . Occasional tremors   . Osteoporosis    taking vitamin d  . Persistent disorder of initiating or maintaining sleep   . Restless legs    takes mirapex daily  . S/P inguinal herniorrhaphy   . Stroke [facility]) 2000   TIA- series that have resolved-  . UTI (  lower urinary tract infection) 10/04/2013    Current Medications: Current Meds  Medication Sig  . amLODipine (NORVASC) 10 MG tablet TAKE 1/2 TABLET (5 MG) DAILY  . aspirin EC 81 MG tablet Take 1 tablet (81 mg total) by mouth daily.  . cholecalciferol (VITAMIN D) 1000 UNITS tablet Take 1,000 Units by mouth daily.  . diclofenac Sodium (VOLTAREN) 1 % GEL Apply 2 g topically daily as needed (pain).  . famotidine (PEPCID) 40 MG tablet Take 40 mg by mouth daily as needed for heartburn or indigestion.  . hyoscyamine (LEVSIN SL) 0.125 MG SL tablet Place 1 tablet (0.125 mg total) under the tongue every 4 (four) hours as needed.  . hyoscyamine (LEVSIN SL) 0.125 MG SL tablet Place 0.125 mg under the tongue every 4 (four) hours as needed for cramping.  . isosorbide mononitrate (IMDUR) 60 MG 24 hr tablet TAKE 1/2 TABLET EVERY DAY  .  losartan (COZAAR) 100 MG tablet TAKE 1 TABLET EVERY DAY  . magnesium oxide (MAG-OX) 400 MG tablet Take 1,600 mg by mouth daily.   . metoprolol succinate (TOPROL-XL) 50 MG 24 hr tablet Take 1 tablet (50 mg total) by mouth at bedtime. Please keep upcoming appt in April for future refills. Thank you  . omeprazole (PRILOSEC) 20 MG capsule TAKE 1 CAPSULE EVERY DAY AS NEEDED  . potassium chloride SA (KLOR-CON) 20 MEQ tablet Take 1 tablet (20 mEq total) by mouth daily. Please keep upcoming appt in April for future refills. Thank you  . pramipexole (MIRAPEX) 0.25 MG tablet TAKE 1 TABLET TWICE DAILY  . simvastatin (ZOCOR) 20 MG tablet Take 1 tablet (20 mg total) by mouth at bedtime.     Allergies:   Baclofen and Nitrofurantoin   Social History   Tobacco Use  . Smoking status: Never Smoker  . Smokeless tobacco: Never Used  Substance Use Topics  . Alcohol use: No    Alcohol/week: 0.0 standard drinks  . Drug use: No     Family Hx: The patient's family history includes Arthritis in an other family member; Cancer in her brother; Coronary artery disease in her mother; Depression in her brother; Diabetes in her brother and daughter; Heart disease in her mother; Hypertension in her daughter and another family member; Other in an other family member; Stroke in her brother and father; Thyroid disease in her daughter. There is no history of Breast cancer, Colon cancer, Anesthesia problems, Hypotension, Malignant hyperthermia, Pseudochol deficiency, Colon polyps, Esophageal cancer, Rectal cancer, or Stomach cancer.  ROS   EKGs/Labs/Other Test Reviewed:    EKG:  EKG is  ordered today.  The ekg ordered today demonstrates sinus bradycardia, HR 53, left anterior fascicular block, right bundle branch block, no ST-T wave changes, QTC 405, no change from prior tracing  Recent Labs: 10/13/2019: ALT 12; BUN 20; Creatinine, Ser 1.00; Hemoglobin 12.7; Platelets 158.0; Potassium 4.4; Sodium 135; TSH 3.17   Recent  Lipid Panel Lab Results  Component Value Date/Time   CHOL 146 10/13/2019 09:09 AM   TRIG 99.0 10/13/2019 09:09 AM   HDL 56.90 10/13/2019 09:09 AM   CHOLHDL 3 10/13/2019 09:09 AM   LDLCALC 70 10/13/2019 09:09 AM   LDLDIRECT 73.4 05/20/2014 04:51 PM    Physical Exam:    VS:  BP 130/60   Pulse (!) 53   Ht 5\' 3"  (1.6 m)   Wt 162 lb 12.8 oz (73.8 kg)   SpO2 95%   BMI 28.84 kg/m     Wt Readings from Last  3 Encounters:  10/20/19 162 lb 12.8 oz (73.8 kg)  10/13/19 164 lb (74.4 kg)  10/13/19 167 lb 12.8 oz (76.1 kg)     Constitutional:      Appearance: Healthy appearance. Not in distress.  Neck:     Thyroid: No thyromegaly.     Vascular: JVD normal.  Pulmonary:     Effort: Pulmonary effort is normal.     Breath sounds: No wheezing. No rales.  Cardiovascular:     Normal rate. Regular rhythm. Normal S1. Normal S2.     Murmurs: There is no murmur.  Edema:    Peripheral edema absent.  Abdominal:     Palpations: Abdomen is soft. There is no hepatomegaly.  Skin:    General: Skin is warm and dry.  Neurological:     Mental Status: Alert and oriented to person, place and time.     Cranial Nerves: Cranial nerves are intact.      ASSESSMENT & PLAN:    1. Preoperative cardiovascular examination Her risk of perioperative major cardiac event is elevated at 6.6% according to the revised cardiac risk index (RCRI).  However, her functional capacity in METs is good at 5.62 according to the [facility] activity status index (DASI).  Therefore, she may proceed with her surgery at acceptable risk without further testing.  Ideally, we recommend she remain on aspirin without interruption.  However, as her last PCI was over 14 years ago, if the bleeding risk is too great she may hold her aspirin 5-7 days prior to her procedure and resume it postoperatively as soon as possible.  2. Coronary artery disease involving native coronary artery of native heart without angina pectoris History of remote PCI  with DES to the LCx and RCA in 2006.  She has moderate residual disease in the LAD that was managed medically.  She is doing well without anginal symptoms.  Continue current regimen which includes amlodipine, aspirin, isosorbide, metoprolol succinate, simvastatin.  3. Essential hypertension The patient's blood pressure is controlled on her current regimen.  Continue current therapy.   4. Mixed hyperlipidemia LDL optimal on most recent lab work.  Continue current Rx.      Dispo:  Return in about 1 year (around 10/19/2020) for Routine Follow Up, w/ Dr. Burt Knack, or [name], PA-C, in person.   Medication Adjustments/Labs and Tests Ordered: Current medicines are reviewed at length with the patient today.  Concerns regarding medicines are outlined above.  Tests Ordered: Orders Placed This Encounter  Procedures  . EKG 12-Lead   Medication Changes: No orders of the defined types were placed in this encounter.   Signed, [name], PA-C  10/20/2019 11:51 AM    [facility] [address], [place], [place]  [address] Phone: [phone]; Fax: [phone]

## 2019-10-20 ENCOUNTER — Ambulatory Visit: Payer: Medicare HMO | Admitting: Physician Assistant

## 2019-10-20 ENCOUNTER — Other Ambulatory Visit: Payer: Self-pay

## 2019-10-20 ENCOUNTER — Encounter: Payer: Self-pay | Admitting: Physician Assistant

## 2019-10-20 VITALS — BP 130/60 | HR 53 | Ht 63.0 in | Wt 162.8 lb

## 2019-10-20 DIAGNOSIS — E782 Mixed hyperlipidemia: Secondary | ICD-10-CM | POA: Diagnosis not present

## 2019-10-20 DIAGNOSIS — Z01810 Encounter for preprocedural cardiovascular examination: Secondary | ICD-10-CM

## 2019-10-20 DIAGNOSIS — I2510 Atherosclerotic heart disease of native coronary artery without angina pectoris: Secondary | ICD-10-CM

## 2019-10-20 DIAGNOSIS — I10 Essential (primary) hypertension: Secondary | ICD-10-CM | POA: Diagnosis not present

## 2019-10-20 NOTE — Patient Instructions (Signed)
Medication Instructions:  Your physician recommends that you continue on your current medications as directed. Please refer to the Current Medication list given to you today.  *If you need a refill on your cardiac medications before your next appointment, please call your pharmacy*  Lab Work: None ordered today  Testing/Procedures: None ordered today  Follow-Up: At [facility], you and your health needs are our priority.  As part of our continuing mission to provide you with exceptional heart care, we have created designated Provider Care Teams.  These Care Teams include your primary Cardiologist (physician) and Advanced Practice Providers (APPs -  Physician Assistants and Nurse Practitioners) who all work together to provide you with the care you need, when you need it.  We recommend signing up for the patient portal called "MyChart".  Sign up information is provided on this After Visit Summary.  MyChart is used to connect with patients for Virtual Visits (Telemedicine).  Patients are able to view lab/test results, encounter notes, upcoming appointments, etc.  Non-urgent messages can be sent to your provider as well.   To learn more about what you can do with MyChart, go to [email or web address].    Your next appointment:   12 month(s)  The format for your next appointment:   In Person  Provider:   You may see Michael Cooper, MD or Scott Weaver, PA-C 

## 2019-10-20 NOTE — Telephone Encounter (Signed)
See my OV note from 10/20/2019. [name], PA-C    10/20/2019 2:11 PM

## 2019-10-27 ENCOUNTER — Telehealth: Payer: Self-pay

## 2019-10-27 MED ORDER — AMLODIPINE BESYLATE 5 MG PO TABS: 5.0000 mg | ORAL_TABLET | Freq: Every day | ORAL | 1 refills | Status: DC

## 2019-10-27 NOTE — Telephone Encounter (Signed)
Patient pharmacy called in to see if Dr. [name] can send in a prescription for    amLODipine (NORVASC) 10 MG tablet [id number]    But the pharmacy is asking to write the prescription for 5 mg instead of 10 mg because the patient is having trouble splitting the pills in half.   Please send it to [facility], [address]  [place], [place] [address]  Phone:  [phone] Fax:  [phone]  DEA #:  --

## 2019-10-27 NOTE — Telephone Encounter (Signed)
Patient notified that rx was sent in for the 5mg  tabs.

## 2019-11-10 ENCOUNTER — Ambulatory Visit: Payer: Medicare HMO | Admitting: Family Medicine

## 2019-11-12 ENCOUNTER — Encounter: Payer: Self-pay | Admitting: Family Medicine

## 2019-11-12 ENCOUNTER — Other Ambulatory Visit: Payer: Self-pay | Admitting: Cardiovascular Disease

## 2019-11-12 ENCOUNTER — Ambulatory Visit: Payer: Medicare HMO | Admitting: Family Medicine

## 2019-11-12 ENCOUNTER — Other Ambulatory Visit: Payer: Self-pay

## 2019-11-12 VITALS — BP 165/75 | HR 59 | Ht 63.0 in | Wt 157.0 lb

## 2019-11-12 DIAGNOSIS — M7631 Iliotibial band syndrome, right leg: Secondary | ICD-10-CM

## 2019-11-12 DIAGNOSIS — M25551 Pain in right hip: Secondary | ICD-10-CM | POA: Diagnosis not present

## 2019-11-12 DIAGNOSIS — M1711 Unilateral primary osteoarthritis, right knee: Secondary | ICD-10-CM | POA: Diagnosis not present

## 2019-11-12 MED ORDER — PENNSAID 2 % EX SOLN
1.0000 | Freq: Two times a day (BID) | CUTANEOUS | 3 refills | Status: DC
Start: 2019-11-12 — End: 2020-04-20

## 2019-11-12 NOTE — Assessment & Plan Note (Signed)
Pain is improved.  She gets a good response with the pennsaid.  -Counseled on home exercise therapy and supportive care. -Could consider physical therapy.

## 2019-11-12 NOTE — Assessment & Plan Note (Signed)
Pain is mild intermittent in nature.  She has had a good response with the injection.  She has been doing a lot of moving and lifting with selling her beach house recently. -Counseled on home exercise therapy and supportive care. -Could consider physical therapy.

## 2019-11-12 NOTE — Patient Instructions (Signed)
Good to see you Please try the exercises and hold don't go to pain Please try ice  I sent the pennsaid in. Let me know if it's too expensive   Please send me a message in MyChart with any questions or updates.  Please see me back in 4-6 weeks.   --Dr. Raeford Razor

## 2019-11-12 NOTE — Progress Notes (Signed)
Heather [name] - 85 y.o. female MRN [id number]  Date of birth: 1934-11-26  SUBJECTIVE:  Including CC & ROS.  Chief Complaint  Patient presents with  . Follow-up    right hip    Heather [name] is a 85 y.o. female that is doing well.  She notices intermittent pain in the right lateral aspect.  The knee pain is occurring intermittently as well.  Denies any mechanical symptoms.  The Pennsaid has improved her overall symptoms.   Review of Systems See HPI   HISTORY: Past Medical, Surgical, Social, and Family History Reviewed & Updated per EMR.   Pertinent Historical Findings include:  Past Medical History:  Diagnosis Date  . Angina   . Atrial fibrillation ([place])   . Borderline diabetes   . Cancer ([place])    cervical after hysterectomy   . Cataract    removed both eyes  . Coronary artery disease    post percutaneous coronary intervention in 2006  . Depression with anxiety 01/05/2012  . Dermatitis   . Diabetes mellitus without complication (HCC)    no meds  . Diarrhea 04/17/2015  . Diverticulitis    hx of  . DJD (degenerative joint disease) of knee 09/18/2011   knees- both, elbow- R, ankles   . DVT, lower extremity ([place])    rt. leg after knee surgery  . Dysrhythmia, cardiac    "palpitations"  . Fatigue   . Gastric ulcer    hx of  . GERD (gastroesophageal reflux disease)    prilosec daily  . H/O dizziness    pre-syncope/due to dehydration  . H/O hiatal hernia   . Headache 04/17/2015  . Hip pain 08/28/2011  . Hx of total knee replacement 11/12/11   left knee  . Hyperlipidemia   . Hyperlipidemia associated with type 2 diabetes mellitus ([place]) 05/27/2017  . Hypertension    takes medications daily  . Medicare annual wellness visit, subsequent 11/22/2013  . Occasional tremors   . Osteoporosis    taking vitamin d  . Persistent disorder of initiating or maintaining sleep   . Restless legs    takes mirapex daily  . S/P inguinal herniorrhaphy   . Stroke [facility]) 2000   TIA- series  that have resolved-  . UTI (lower urinary tract infection) 10/04/2013    Past Surgical History:  Procedure Laterality Date  . ABDOMINAL HYSTERECTOMY    . BRAIN SURGERY     related to stroke   2000 patient unaware states she has not had  . COLONOSCOPY    . CORONARY ANGIOPLASTY  03/2005; 06/2005   "1" plus "1"= "2 total"  . EYE SURGERY  11-16-13   cataract -right eye, left eye summer 2015  . FOOT SURGERY     left foot stress fracture repair  . HERNIA REPAIR     1989- esophageal hernia   . JOINT REPLACEMENT     left  . KNEE ARTHROSCOPY     2 on left and 1 on right  . TOTAL KNEE ARTHROPLASTY  11/12/2011   Procedure: TOTAL KNEE ARTHROPLASTY;  Surgeon: Lorn Junes, MD;  Location: [facility];  Service: Orthopedics;  Laterality: Left;  Dr Noemi Chapel wants 90 minutes for this case  . TOTAL KNEE ARTHROPLASTY Right 11/29/2014   Procedure: RIGHT TOTAL KNEE ARTHROPLASTY;  Surgeon: Vickey Huger, MD;  Location: [facility];  Service: Orthopedics;  Laterality: Right;  . UPPER GASTROINTESTINAL ENDOSCOPY  2013    Family History  Problem Relation Age of Onset  .  Stroke Father        family hx of M 1st degree relative <50  . Coronary artery disease Mother   . Heart disease Mother   . Depression Brother   . Stroke Brother   . Diabetes Brother   . Cancer Brother        bladder with mets  . Diabetes Daughter        borderline  . Hypertension Daughter   . Arthritis Other        family hx of  . Hypertension Other        family hx of  . Other Other        family hx of cardiovascular disorder  . Thyroid disease Daughter   . Breast cancer Neg Hx   . Colon cancer Neg Hx   . Anesthesia problems Neg Hx   . Hypotension Neg Hx   . Malignant hyperthermia Neg Hx   . Pseudochol deficiency Neg Hx   . Colon polyps Neg Hx   . Esophageal cancer Neg Hx   . Rectal cancer Neg Hx   . Stomach cancer Neg Hx     Social History   Socioeconomic History  . Marital status: Married    Spouse name: Not on file  . Number  of children: 3  . Years of education: 68  . Highest education level: Not on file  Occupational History  . 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 Smoking status: Never Smoker  . Smokeless tobacco: Never Used  Substance and Sexual Activity  . Alcohol use: No    Alcohol/week: 0.0 standard drinks  . Drug use: No  . Sexual activity: Yes    Birth control/protection: Surgical    Comment: lives with husband, no dietary restrictions  Other Topics Concern  . Not on file  Social History Narrative   Lives with husband, Caffeine use- Half Caffeine)- 2 cups daily.  3 children living, one passed away.   Education: HS. Business college.  Retired.    Social Determinants of Health   Financial Resource Strain: Low Risk   . Difficulty of Paying Living Expenses: Not hard at all  Food Insecurity: No Food Insecurity  . Worried About Charity fundraiser in the Last Year: Never true  . Ran Out of Food in the Last Year: Never true  Transportation Needs: No Transportation Needs  . Lack of Transportation (Medical): No  . Lack of Transportation (Non-Medical): No  Physical Activity:   . Days of Exercise per Week:   . Minutes of Exercise per Session:   Stress:   . Feeling of Stress :   Social Connections:   . Frequency of Communication with Friends and Family:   . Frequency of Social Gatherings with Friends and Family:   . Attends Religious Services:   . Active Member of Clubs or Organizations:   . Attends Archivist Meetings:   Marland Kitchen Marital Status:   Intimate Partner Violence:   . Fear of Current or Ex-Partner:   . Emotionally Abused:   Marland Kitchen Physically Abused:   . Sexually Abused:      PHYSICAL EXAM:  VS: BP (!) 165/75   Pulse (!) 59   Ht 5\' 3"  (1.6 m)   Wt 157 lb (71.2 kg)   BMI 27.81 kg/m  Physical Exam Gen: NAD, alert, cooperative with exam, well-appearing MSK:  Right knee: No obvious effusion. Stability with valgus  and  varus stress testing. Normal strength resistance. Right hip: Symptoms to palpation of the greater trochanter. Normal internal and external rotation of the hip Neurovascularly intact     ASSESSMENT & PLAN:   It band syndrome, right Pain is improved.  She gets a good response with the pennsaid.  -Counseled on home exercise therapy and supportive care. -Could consider physical therapy.  Greater trochanteric pain syndrome of right lower extremity Pain is mild intermittent in nature.  She has had a good response with the injection.  She has been doing a lot of moving and lifting with selling her beach house recently. -Counseled on home exercise therapy and supportive care. -Could consider physical therapy.

## 2019-11-13 ENCOUNTER — Other Ambulatory Visit: Payer: Self-pay | Admitting: Cardiovascular Disease

## 2019-12-13 ENCOUNTER — Emergency Department (HOSPITAL_COMMUNITY)
Admission: EM | Admit: 2019-12-13 | Discharge: 2019-12-14 | Disposition: A | Discharge status: Home / Self Care | Payer: Medicare HMO | Attending: Emergency Medicine | Admitting: Emergency Medicine

## 2019-12-13 ENCOUNTER — Other Ambulatory Visit: Payer: Self-pay

## 2019-12-13 DIAGNOSIS — R109 Unspecified abdominal pain: Secondary | ICD-10-CM | POA: Insufficient documentation

## 2019-12-13 DIAGNOSIS — Z5321 Procedure and treatment not carried out due to patient leaving prior to being seen by health care provider: Secondary | ICD-10-CM | POA: Insufficient documentation

## 2019-12-13 NOTE — ED Notes (Signed)
Patient states she is tired of waiting to see a doctor and she will just go to her doctor appointment in the morning

## 2019-12-14 ENCOUNTER — Telehealth: Payer: Self-pay | Admitting: Family Medicine

## 2019-12-14 DIAGNOSIS — N8189 Other female genital prolapse: Secondary | ICD-10-CM | POA: Diagnosis not present

## 2019-12-14 DIAGNOSIS — R350 Frequency of micturition: Secondary | ICD-10-CM | POA: Diagnosis not present

## 2019-12-14 NOTE — Telephone Encounter (Signed)
Caller : [place]  Call Back # [phone]  Patient states that she is having issues with stomach, patient also states she is having dry heaves. Patient seen Dr Melba Coon this morning and she recommend that she follow up with [facility] Doc.  Please advise

## 2019-12-14 NOTE — Telephone Encounter (Signed)
Patient will try and call gastro to see if she can make and appointment.  She is having upper abdominal pain and dry heaves.

## 2019-12-15 ENCOUNTER — Other Ambulatory Visit (INDEPENDENT_AMBULATORY_CARE_PROVIDER_SITE_OTHER): Payer: Medicare HMO

## 2019-12-15 ENCOUNTER — Encounter (HOSPITAL_BASED_OUTPATIENT_CLINIC_OR_DEPARTMENT_OTHER): Payer: Self-pay | Admitting: Obstetrics and Gynecology

## 2019-12-15 ENCOUNTER — Other Ambulatory Visit: Payer: Self-pay

## 2019-12-15 ENCOUNTER — Telehealth: Payer: Self-pay | Admitting: Gastroenterology

## 2019-12-15 DIAGNOSIS — R112 Nausea with vomiting, unspecified: Secondary | ICD-10-CM | POA: Diagnosis not present

## 2019-12-15 LAB — CBC
HCT: 40.6 % (ref 36.0–46.0)
Hemoglobin: 13.7 g/dL (ref 12.0–15.0)
MCHC: 33.7 g/dL (ref 30.0–36.0)
MCV: 92.6 fl (ref 78.0–100.0)
Platelets: 170 10*3/uL (ref 150.0–400.0)
RBC: 4.39 Mil/uL (ref 3.87–5.11)
RDW: 13.2 % (ref 11.5–15.5)
WBC: 6.6 10*3/uL (ref 4.0–10.5)

## 2019-12-15 LAB — COMPREHENSIVE METABOLIC PANEL
ALT: 19 U/L (ref 0–35)
AST: 19 U/L (ref 0–37)
Albumin: 4.3 g/dL (ref 3.5–5.2)
Alkaline Phosphatase: 84 U/L (ref 39–117)
BUN: 21 mg/dL (ref 6–23)
CO2: 29 mEq/L (ref 19–32)
Calcium: 9.3 mg/dL (ref 8.4–10.5)
Chloride: 104 mEq/L (ref 96–112)
Creatinine, Ser: 0.98 mg/dL (ref 0.40–1.20)
GFR: 54.17 mL/min — ABNORMAL LOW (ref >60.00)
Glucose, Bld: 147 mg/dL — ABNORMAL HIGH (ref 70–99)
Potassium: 4.2 mEq/L (ref 3.5–5.1)
Sodium: 138 mEq/L (ref 135–145)
Total Bilirubin: 0.5 mg/dL (ref 0.2–1.2)
Total Protein: 6.4 g/dL (ref 6.0–8.3)

## 2019-12-15 LAB — LIPASE: Lipase: 49 U/L (ref 11.0–59.0)

## 2019-12-15 MED ORDER — ONDANSETRON 4 MG PO TBDP
4.0000 mg | ORAL_TABLET | Freq: Four times a day (QID) | ORAL | 0 refills | Status: DC | PRN
Start: 2019-12-15 — End: 2021-04-15

## 2019-12-15 NOTE — Progress Notes (Addendum)
ADDENDUM:  Chart reviewed by anesthesia, Konrad Felix PA, stated meets surgery center guidelines and per cardiac clearance is stable and good METS.  Spoke w/ via phone for pre-op interview--- PT Lab needs dos---- no             Lab results------ getting CBC, CMP, T&S done 12-18-2019 @ 0845;  Current ekg in epic/ chart COVID test ------ 12-18-2019 @ 0930 Arrive at ------- 0600 NPO after ------ MN Medications to take morning of surgery ----- Imdur, Norvasc, Prilosec, Mirapex w/ sips of water Diabetic medication ----- pt does take meds Patient Special Instructions ----- reviewed [facility] guidelines and visitor policy and asked to bring home medications in original bottles dos Pre-Op special Istructions ----- pt has cardiac clearance and with last office note dated 10-20-2019 in epic/ chart Patient verbalized understanding of instructions that were given at this phone interview. Patient denies shortness of breath, chest pain, fever, cough a this phone interview.   Anesthesia Review:  Hx CAD with NSTEMI s/p  PCI and DES 03-16-2005 x1 and 06-27-2005 x1 ;  Hx TIA (several in 2000) pt stated none since, never found cause, but has residual right arm/ hand tremors;  Hx HTN, DM2 diet controlled, GERD.  Pt denies any cardiac s&s, sob, no peripheral swelling.  Chart to be reviewed by Konrad Felix PA.  PCP:  Dr [name]  (lov 10-13-2019 epic) Cardiologist :  Dr Burt Knack  (lov 10-20-2019 epic) Chest x-ray :  07-04-2015 epic EKG :  10-20-2019 epic Echo :  Stress echo 10-24-2011 epic Stress test:  Nuclear 08-03-2009  Epic Event monitor:  07-07-2009 epic Cardiac Cath :  06-27-2005 epic Sleep Study/ CPAP : NO Fasting Blood Sugar :      / Checks Blood Sugar -- times a day:  Pt does not check Blood Thinner/ Instructions Maryjane Hurter Dose:  NO ASA / Instructions/ Last Dose :  ASA 81ng;  Cardiac clearance states recommended pt to not stop ASA but if bleeding risk to great may stop 5-7 days/  Pt verbalized  understanding cardiology recommending however was not given any instrucions by Dr Sandford Craze.  Advised pt to call Dr Sandford Craze office to get her instructions if she need to stop ASA or not, pt verbalized understanding.  Patient denies shortness of breath, chest pain, fever, and cough at this phone interview.

## 2019-12-15 NOTE — Telephone Encounter (Signed)
Recommend she come to the lab for CBC, CMET, lipase. Can give her some Zofran 4mg  ODT every 6 hours PRN. #30. I can add her at 33 tomorrow PM and see what we can figure out if she can do that. Thanks

## 2019-12-15 NOTE — Telephone Encounter (Signed)
Left message for patient to call back rx sent Lab orders placed

## 2019-12-15 NOTE — Telephone Encounter (Signed)
Dr. Havery [name] please advise.  No App openings this or next week.

## 2019-12-15 NOTE — Telephone Encounter (Signed)
Patient notified  She will come for labs today and appt tomorrow pm

## 2019-12-15 NOTE — Telephone Encounter (Signed)
Pt has been experiencing upper abdominal pain and dry heaves lately, she reported that sxs got worse this past weekend. She is scheduled for a rectocele repair next Tuesday 6/15 with Dr. Melba Coon. He would like pt to be evaluated for sxs before her surgery.

## 2019-12-16 ENCOUNTER — Ambulatory Visit: Payer: Medicare HMO | Admitting: Gastroenterology

## 2019-12-16 ENCOUNTER — Encounter: Payer: Self-pay | Admitting: Gastroenterology

## 2019-12-16 VITALS — BP 124/64 | HR 62 | Ht 63.0 in | Wt 162.6 lb

## 2019-12-16 DIAGNOSIS — K8020 Calculus of gallbladder without cholecystitis without obstruction: Secondary | ICD-10-CM | POA: Diagnosis not present

## 2019-12-16 DIAGNOSIS — R112 Nausea with vomiting, unspecified: Secondary | ICD-10-CM | POA: Diagnosis not present

## 2019-12-16 DIAGNOSIS — R1013 Epigastric pain: Secondary | ICD-10-CM

## 2019-12-16 NOTE — Patient Instructions (Signed)
Follow up with Dr. Havery [name] as needed.

## 2019-12-16 NOTE — Progress Notes (Signed)
HPI :  85 year old female here for a follow-up visit for abdominal pain.  I saw her in 2018 for recurrent epigastric and right upper quadrant pain.  At that time she had had a CT scan which showed gallstones, moderate hiatal hernia, small epigastric ventral hernia and umbilical hernia.  At that time we proceeded with an EGD which showed a 4 cm hiatal hernia and some gastritis, biopsies negative for H. pylori.  Given gallstones noted on imaging I referred her to general surgery to discuss cholecystectomy given her symptoms at the time.  She states she never followed through with that as her symptoms had improved and actually resolved. She had a reflux surgery in 1988 - she had paraesophageal hernia repaired along with this.   She states she was in her usual state of health until this past Sunday where she developed severe epigastric pain associated with multiple episodes of dry heaving.  This lasted for several hours overnight and then eventually went away on its own.  She did go to the [facility] emergency room for evaluation but left without being seen given she did not want to wait.  Since this weekend she has been feeling back to normal.  She denies any pain that recurred.  No nausea or vomiting and she is tolerating p.o., although being cautious with her diet.  She denies any diarrhea at around the time of the symptoms.  She denies any clear triggers.  She did not have any sick contacts or eat anything out of the norm.  Pain was in her entire upper abdomen across.  She states it reminded her of her prior hernia pain.  She has been taking omeprazole every day and that has worked pretty well to control her reflux at baseline.  She went to her lab yesterday and had normal liver enzymes and lipase as well as white blood cell count.  She is feeling back to baseline.  She has a bladder surgery scheduled per urology next week.  Colonoscopy 06/2011 - Dr. Earlean Shawl, no report available today, she thinks it was  normal EGD 05/2010 - Dr. Earlean Shawl, distal esophageal narrowing s/p dilation to 67mm, hiatal hernia, inflamed gastric polyps, gastritis - HP negative  CT scan abdomen / pelvis 10/22/2016 - cholelithiasis, moderate hiatal hernia, diverticulosis, small epigastric ventral hernia and umbilical hernia  EGD 82/42/35 -  - A 4 cm hiatal hernia was present. - The exam of the esophagus was otherwise normal. No stenosis / stricture appreciated. - Diffuse mildly erythematous mucosa was found in the gastric antrum without focal ulceration. Biopsies were taken with a cold forceps from the antrum, body, and incisura for Helicobacter pylori testing. - A few small sessile polyps were found in the gastric body. Two representative polyps were removed with a cold biopsy forceps. Resection and retrieval were complete. - The exam of the stomach was otherwise normal. - The duodenal bulb and second portion of the duodenum were normal.  Diagnosis 1. Surgical [P], gastric antrum and gastric body - REACTIVE GASTROPATHY. - NEGATIVE FOR HELICOBACTER PYLORI. - NO INTESTINAL METAPLASIA, DYSPLASIA, OR MALIGNANCY. 2. Surgical [P], stomach, polyp (multiple) - HYPERPLASTIC POLYP(S). - NEGATIVE FOR HELICOBACTER PYLORI. - NO INTESTINAL METAPLASIA, DYSPLASIA, OR MALIGNANCY.    Past Medical History:  Diagnosis Date  . Coronary artery disease cardiologist--- dr Burt Knack   hx NSTEMI  w/ cardiac cath 03-16-2005 PCI with DES to LCx;   cardiac cath 06-27-2005  PCI with DES to RCA with residual dx LAD manage medcially;  myoview/ lexiscan 01-26-20211 normal no ischemia, ef 70%;  stress echo w/ dobutamine 10-24-2011 negative ishcmeia , normal ef  . Depression with anxiety   . Diabetes mellitus type 2, diet-controlled ([place])    followed by pcp    . GERD (gastroesophageal reflux disease)   . Headache   . Hiatal hernia    recurrence,  hx HH repair 1989  . History of cervical cancer    s/p  total hysterectomy  . History of DVT of  lower extremity 2016   11-29-2014 post op right TKA of right lower extremity and completed xarelto   . History of esophageal stricture    hx s/p dilatation's  . History of gastric ulcer 2005 approx.  Marland Kitchen History of non-ST elevation myocardial infarction (NSTEMI) 03/16/2005  . History of palpitations 2010   event monitor 07-07-2009 showed NSR w/ freq. SVT ectopies with short runs, rare PVCs  . History of TIA (transient ischemic attack) 06/1999   12-15-2019  per pt had several TIA between 12/ 2000 to 02/ 2001 , was sent to specialist @[facility] , had test that was normal and pt stated no TIAs since but has residual of essential tremors of right arm/ hand  . Hypertension   . IT band syndrome   . Mixed hyperlipidemia   . Multiple thyroid nodules    followed by pcp---   ultrasound 11-22-2014 no bx   (12-15-2019 per pt had a endocrinologist and was told did not need bx)  . OA (osteoarthritis)    knees, elbow, hip, ankles  . Occasional tremors    right arm/ hand  s/p TIA residual 2000  . Osteoporosis    taking vitamin d  . Pelvic relaxation   . Right bundle branch block (RBBB) with left anterior fascicular block (LAFB)   . RLS (restless legs syndrome)   . S/P drug eluting coronary stent placement 2006   03-16-2005  PCI x1 DES to LCx;   06-27-2005  PCI x1 DES to RCA  . SUI (stress urinary incontinence, female)      Past Surgical History:  Procedure Laterality Date  . CATARACT EXTRACTION W/ INTRAOCULAR LENS  IMPLANT, BILATERAL  2015  . COLONOSCOPY  last one ?  . CORONARY ANGIOPLASTY WITH STENT PLACEMENT  03-16-2005   dr Lia Foyer   PCI and DES x1 to LCx  . CORONARY ANGIOPLASTY WITH STENT PLACEMENT  06-27-2005  dr Lia Foyer   PCI and DES x1 to RCA with residual disease LAD 70-80% to manage medically  . FOOT SURGERY Left 1990s   left foot stress fracture repair, per pt no hardware  . HIATAL HERNIA REPAIR  1989  . KNEE ARTHROSCOPY Bilateral right ?/   left x2 , last one 09-12-2009 @ [place]  . TOTAL  KNEE ARTHROPLASTY  11/12/2011   Procedure: TOTAL KNEE ARTHROPLASTY;  Surgeon: Lorn Junes, MD;  Location: [place];  Service: Orthopedics;  Laterality: Left;  Dr Noemi Chapel wants 90 minutes for this case  . TOTAL KNEE ARTHROPLASTY Right 11/29/2014   Procedure: RIGHT TOTAL KNEE ARTHROPLASTY;  Surgeon: Vickey Huger, MD;  Location: [place];  Service: Orthopedics;  Laterality: Right;  . UPPER GASTROINTESTINAL ENDOSCOPY  last one 04-25-2017   with dilatation esophageal stricture and savary dilatation  . VAGINAL HYSTERECTOMY  1988    no ovaries removed   Family History  Problem Relation Age of Onset  . Stroke Father        family hx of M 1st degree relative <50  . Coronary artery  disease Mother   . Heart disease Mother   . Depression Brother   . Stroke Brother   . Diabetes Brother   . Cancer Brother        bladder with mets  . Diabetes Daughter        borderline  . Hypertension Daughter   . Arthritis Other        family hx of  . Hypertension Other        family hx of  . Other Other        family hx of cardiovascular disorder  . Thyroid disease Daughter   . Breast cancer Neg Hx   . Colon cancer Neg Hx   . Anesthesia problems Neg Hx   . Hypotension Neg Hx   . Malignant hyperthermia Neg Hx   . Pseudochol deficiency Neg Hx   . Colon polyps Neg Hx   . Esophageal cancer Neg Hx   . Rectal cancer Neg Hx   . Stomach cancer Neg Hx    Social History   Tobacco Use  . Smoking status: Never Smoker  . Smokeless tobacco: Never Used  Substance Use Topics  . Alcohol use: No    Alcohol/week: 0.0 standard drinks  . Drug use: Never   Current Outpatient Medications  Medication Sig Dispense Refill  . amLODipine (NORVASC) 5 MG tablet Take 1 tablet (5 mg total) by mouth daily. (Patient taking differently: Take 5 mg by mouth daily. ) 90 tablet 1  . Ascorbic Acid (VITAMIN C) 500 MG CHEW Chew by mouth daily.    Marland Kitchen aspirin EC 81 MG tablet Take 1 tablet (81 mg total) by mouth daily.    . cholecalciferol  (VITAMIN D) 1000 UNITS tablet Take 1,000 Units by mouth daily.    . Diclofenac Sodium (PENNSAID) 2 % SOLN Place 1 application onto the skin 2 (two) times daily. (Patient taking differently: Place 1 application onto the skin 2 (two) times daily as needed. ) 112 g 3  . hyoscyamine (LEVSIN SL) 0.125 MG SL tablet Place 1 tablet (0.125 mg total) under the tongue every 4 (four) hours as needed. 30 tablet 0  . isosorbide mononitrate (IMDUR) 60 MG 24 hr tablet TAKE 1/2 TABLET EVERY DAY (Patient taking differently: Take 30 mg by mouth daily. ) 45 tablet 3  . losartan (COZAAR) 100 MG tablet TAKE 1 TABLET EVERY DAY (Patient taking differently: Take 100 mg by mouth daily. ) 90 tablet 1  . magnesium oxide (MAG-OX) 400 MG tablet Take 400 mg by mouth 2 (two) times daily.     . metoprolol succinate (TOPROL-XL) 50 MG 24 hr tablet Take 1 tablet (50 mg total) by mouth at bedtime. Please keep upcoming appt in April for future refills. Thank you (Patient taking differently: Take 50 mg by mouth at bedtime. Please keep upcoming appt in April for future refills. Thank you) 90 tablet 0  . omeprazole (PRILOSEC) 20 MG capsule TAKE 1 CAPSULE EVERY DAY AS NEEDED (Patient taking differently: Take 20 mg by mouth daily. TAKE 1 CAPSULE EVERY DAY AS NEEDED) 90 capsule 1  . ondansetron (ZOFRAN ODT) 4 MG disintegrating tablet Take 1 tablet (4 mg total) by mouth every 6 (six) hours as needed for nausea or vomiting. 30 tablet 0  . potassium chloride SA (KLOR-CON) 20 MEQ tablet Take 1 tablet (20 mEq total) by mouth daily. Please keep upcoming appt in April for future refills. Thank you (Patient taking differently: Take 20 mEq by mouth at bedtime. Please keep  upcoming appt in April for future refills. Thank you) 90 tablet 0  . pramipexole (MIRAPEX) 0.25 MG tablet TAKE 1 TABLET TWICE DAILY (Patient taking differently: Take 0.25 mg by mouth in the morning and at bedtime. ) 180 tablet 1  . simvastatin (ZOCOR) 20 MG tablet TAKE 1 TABLET AT BEDTIME  (Patient taking differently: Take 20 mg by mouth at bedtime. ) 90 tablet 3   No current facility-administered medications for this visit.   Allergies  Allergen Reactions  . Baclofen Other (See Comments)    Hyperactivity   . Nitrofurantoin Rash     Review of Systems: All systems reviewed and negative except where noted in HPI.   Lab Results  Component Value Date   WBC 6.6 12/15/2019   HGB 13.7 12/15/2019   HCT 40.6 12/15/2019   MCV 92.6 12/15/2019   PLT 170.0 12/15/2019    Lab Results  Component Value Date   CREATININE 0.98 12/15/2019   BUN 21 12/15/2019   NA 138 12/15/2019   K 4.2 12/15/2019   CL 104 12/15/2019   CO2 29 12/15/2019    Lab Results  Component Value Date   ALT 19 12/15/2019   AST 19 12/15/2019   ALKPHOS 84 12/15/2019   BILITOT 0.5 12/15/2019     Physical Exam: BP 124/64   Pulse 62   Ht 5\' 3"  (1.6 m)   Wt 162 lb 9.6 oz (73.8 kg)   SpO2 97%   BMI 28.80 kg/m  Constitutional: Pleasant,well-developed,female in no acute distress. HEENT: Normocephalic and atraumatic. Conjunctivae are normal. No scleral icterus. Neck supple.  Cardiovascular: Normal rate, regular rhythm.  Pulmonary/chest: Effort normal and breath sounds normal. No wheezing, rales or rhonchi. Abdominal: Soft, nondistended, nontender. Small diastasis recti / ventral epigastric hernia. There are no masses palpable.  Extremities: no edema Lymphadenopathy: No cervical adenopathy noted. Neurological: Alert and oriented to person place and time. Skin: Skin is warm and dry. No rashes noted. Psychiatric: Normal mood and affect. Behavior is normal.   ASSESSMENT AND PLAN: 85 year old female here for reassessment of the following:  Epigastric pain / vomiting / history of gallstones - the patient previously had intermittent epigastric pain back in 2018 which led to an evaluation I thought could be biliary colic in nature and referred her to surgery to discuss cholecystectomy but she never  followed through with it as her symptoms had resolved.  She had recurrence of severe upper abdominal pain with vomiting that was self-limited and resolved on its own.  Labs a few days later were entirely normal.  In retrospect tough to say exactly for sure what caused this.  I discussed differential with her which includes biliary colic, viral gastritis, ventral hernia pain, GERD, etc.  PUD less likely given her ongoing use of PPI.  She has felt well since then without any recurrence.  Moving forward if she has recurrence of pain I asked her to contact me for reassessment.  Given this is the first episode of pain she has had in a few years she is not interested in seeing surgery to discuss cholecystectomy, would only consider that with recurrent symptoms.  She will proceed with her bladder operation with urology next week, she can follow-up with me as needed for these issues.  She understands that if symptoms are related to gallstones that there is risks of recurrent pain and potentially she could develop complications of gallstones  I spent 30 minutes of time, including in depth chart review,  face-to-face time with  the patient,  and documenting this encounter   Utah Cellar, MD [facility]

## 2019-12-18 ENCOUNTER — Encounter (HOSPITAL_COMMUNITY)
Admission: RE | Admit: 2019-12-18 | Discharge: 2019-12-18 | Disposition: A | Discharge status: Home / Self Care | Payer: Medicare HMO | Source: Ambulatory Visit | Attending: Obstetrics and Gynecology | Admitting: Obstetrics and Gynecology

## 2019-12-18 ENCOUNTER — Other Ambulatory Visit (HOSPITAL_COMMUNITY)
Admission: RE | Admit: 2019-12-18 | Discharge: 2019-12-18 | Disposition: A | Discharge status: Home / Self Care | Payer: Medicare HMO | Source: Ambulatory Visit | Attending: Obstetrics and Gynecology | Admitting: Obstetrics and Gynecology

## 2019-12-18 ENCOUNTER — Other Ambulatory Visit: Payer: Self-pay

## 2019-12-18 DIAGNOSIS — Z01812 Encounter for preprocedural laboratory examination: Secondary | ICD-10-CM | POA: Insufficient documentation

## 2019-12-18 DIAGNOSIS — Z20822 Contact with and (suspected) exposure to covid-19: Secondary | ICD-10-CM | POA: Insufficient documentation

## 2019-12-18 LAB — COMPREHENSIVE METABOLIC PANEL
ALT: 20 U/L (ref 0–44)
AST: 20 U/L (ref 15–41)
Albumin: 4 g/dL (ref 3.5–5.0)
Alkaline Phosphatase: 75 U/L (ref 38–126)
Anion gap: 10 (ref 5–15)
BUN: 19 mg/dL (ref 8–23)
CO2: 25 mmol/L (ref 22–32)
Calcium: 8.9 mg/dL (ref 8.9–10.3)
Chloride: 104 mmol/L (ref 98–111)
Creatinine, Ser: 0.95 mg/dL (ref 0.44–1.00)
GFR calc Af Amer: >60 mL/min (ref >60)
GFR calc non Af Amer: 55 mL/min — ABNORMAL LOW (ref >60)
Glucose, Bld: 197 mg/dL — ABNORMAL HIGH (ref 70–99)
Potassium: 4.3 mmol/L (ref 3.5–5.1)
Sodium: 139 mmol/L (ref 135–145)
Total Bilirubin: 0.9 mg/dL (ref 0.3–1.2)
Total Protein: 6.2 g/dL — ABNORMAL LOW (ref 6.5–8.1)

## 2019-12-18 LAB — CBC
HCT: 40.9 % (ref 36.0–46.0)
Hemoglobin: 13.1 g/dL (ref 12.0–15.0)
MCH: 30.7 pg (ref 26.0–34.0)
MCHC: 32 g/dL (ref 30.0–36.0)
MCV: 95.8 fL (ref 80.0–100.0)
Platelets: 150 10*3/uL (ref 150–400)
RBC: 4.27 MIL/uL (ref 3.87–5.11)
RDW: 12.4 % (ref 11.5–15.5)
WBC: 5 10*3/uL (ref 4.0–10.5)
nRBC: 0 % (ref 0.0–0.2)

## 2019-12-18 LAB — ABO/RH: ABO/RH(D): O NEG

## 2019-12-18 LAB — SARS CORONAVIRUS 2 (TAT 6-24 HRS): SARS Coronavirus 2: NEGATIVE

## 2019-12-21 NOTE — Anesthesia Preprocedure Evaluation (Addendum)
Anesthesia Evaluation  Patient identified by MRN, date of birth, ID band Patient awake    Reviewed: Allergy & Precautions, NPO status , Patient's Chart, lab work & pertinent test results  Airway Mallampati: II  TM Distance: >3 FB Neck ROM: Full    Dental no notable dental hx. (+) Teeth Intact, Dental Advisory Given   Pulmonary neg pulmonary ROS,    Pulmonary exam normal breath sounds clear to auscultation       Cardiovascular hypertension, Pt. on medications and Pt. on home beta blockers + CAD  Normal cardiovascular exam+ dysrhythmias Atrial Fibrillation  Rhythm:Regular Rate:Normal     Neuro/Psych  Headaches, Anxiety    GI/Hepatic hiatal hernia, GERD  ,  Endo/Other  diabetes  Renal/GU Renal InsufficiencyRenal diseaseLab Results      Component                Value               Date                      CREATININE               0.95                12/18/2019                BUN                      19                  12/18/2019                NA                       139                 12/18/2019                K                        4.3                 12/18/2019                CL                       104                 12/18/2019                CO2                      25                  12/18/2019                Musculoskeletal  (+) Arthritis ,   Abdominal   Peds  Hematology Lab Results      Component                Value               Date                      WBC  5.0                 12/18/2019                HGB                      13.1                12/18/2019                HCT                      40.9                12/18/2019                MCV                      95.8                12/18/2019                PLT                      150                 12/18/2019              Anesthesia Other Findings   Reproductive/Obstetrics negative OB ROS                             Anesthesia Physical Anesthesia Plan  ASA: III  Anesthesia Plan: Spinal   Post-op Pain Management:    Induction:   PONV Risk Score and Plan: Treatment may vary due to age or medical condition  Airway Management Planned: Nasal Cannula and Natural Airway  Additional Equipment:   Intra-op Plan:   Post-operative Plan:   Informed Consent: I have reviewed the patients History and Physical, chart, labs and discussed the procedure including the risks, benefits and alternatives for the proposed anesthesia with the patient or authorized representative who has indicated his/her understanding and acceptance.     Dental advisory given  Plan Discussed with: CRNA  Anesthesia Plan Comments:         Anesthesia Quick Evaluation

## 2019-12-21 NOTE — H&P (Signed)
Heather [name] is an 85 y.o. female [phone] with pelvic relaxation.  She has tried multiple different pessaries and has not been able to tolerate, or they fall out.  The pelvic relaxation, bulge and pressure limit her activities due to discomfort. She also has some incontinence significantly improved w Myrbetriq, declines further evaluation.  She has multiple other medical problems, including overactive bladder, CAD, TIA, hypercholesterolemia, GERD, preDM, and goiter.  She received medical and cardiology clearance for surgery - will continue ASA and regimen for BP.  D/W pt r/b/a of surgery - including packing and maintaining catheter overnight.       Pertinent Gynecological History: OB History: G4, P3103 SVD x 3 40wk - no comp 28wk SVD, deceased + abn pap, + HR HPV, many years ago No abn MMG - will not get more No STD  Hysterectomy for pelvic organ prolapse Menstrual History:  No LMP recorded. Patient has had a hysterectomy.    Past Medical History:  Diagnosis Date  . Coronary artery disease cardiologist--- dr Burt Knack   hx NSTEMI  w/ cardiac cath 03-16-2005 PCI with DES to LCx;   cardiac cath 06-27-2005  PCI with DES to RCA with residual dx LAD manage medcially;  myoview/ lexiscan 01-26-20211 normal no ischemia, ef 70%;  stress echo w/ dobutamine 10-24-2011 negative ishcmeia , normal ef  . Depression with anxiety   . Diabetes mellitus type 2, diet-controlled ([place])    followed by pcp    . GERD (gastroesophageal reflux disease)   . Headache   . Hiatal hernia    recurrence,  hx HH repair 1989  . History of cervical cancer    s/p  total hysterectomy  . History of DVT of lower extremity 2016   11-29-2014 post op right TKA of right lower extremity and completed xarelto   . History of esophageal stricture    hx s/p dilatation's  . History of gastric ulcer 2005 approx.  Marland Kitchen History of non-ST elevation myocardial infarction (NSTEMI) 03/16/2005  . History of palpitations 2010   event monitor  07-07-2009 showed NSR w/ freq. SVT ectopies with short runs, rare PVCs  . History of TIA (transient ischemic attack) 06/1999   12-15-2019  per pt had several TIA between 12/ 2000 to 02/ 2001 , was sent to specialist @[facility] , had test that was normal and pt stated no TIAs since but has residual of essential tremors of right arm/ hand  . Hypertension   . IT band syndrome   . Mixed hyperlipidemia   . Multiple thyroid nodules    followed by pcp---   ultrasound 11-22-2014 no bx   (12-15-2019 per pt had a endocrinologist and was told did not need bx)  . OA (osteoarthritis)    knees, elbow, hip, ankles  . Occasional tremors    right arm/ hand  s/p TIA residual 2000  . Osteoporosis    taking vitamin d  . Pelvic relaxation   . Right bundle branch block (RBBB) with left anterior fascicular block (LAFB)   . RLS (restless legs syndrome)   . S/P drug eluting coronary stent placement 2006   03-16-2005  PCI x1 DES to LCx;   06-27-2005  PCI x1 DES to RCA  . SUI (stress urinary incontinence, female)     Past Surgical History:  Procedure Laterality Date  . CATARACT EXTRACTION W/ INTRAOCULAR LENS  IMPLANT, BILATERAL  2015  . COLONOSCOPY  last one ?  . CORONARY ANGIOPLASTY WITH STENT PLACEMENT  03-16-2005   dr  stuckey   PCI and DES x1 to LCx  . CORONARY ANGIOPLASTY WITH STENT PLACEMENT  06-27-2005  dr Lia Foyer   PCI and DES x1 to RCA with residual disease LAD 70-80% to manage medically  . FOOT SURGERY Left 1990s   left foot stress fracture repair, per pt no hardware  . HIATAL HERNIA REPAIR  1989  . KNEE ARTHROSCOPY Bilateral right ?/   left x2 , last one 09-12-2009 @ [place]  . TOTAL KNEE ARTHROPLASTY  11/12/2011   Procedure: TOTAL KNEE ARTHROPLASTY;  Surgeon: Lorn Junes, MD;  Location: [place];  Service: Orthopedics;  Laterality: Left;  Dr Noemi Chapel wants 90 minutes for this case  . TOTAL KNEE ARTHROPLASTY Right 11/29/2014   Procedure: RIGHT TOTAL KNEE ARTHROPLASTY;  Surgeon: Vickey Huger, MD;  Location: [place];  Service: Orthopedics;  Laterality: Right;  . UPPER GASTROINTESTINAL ENDOSCOPY  last one 04-25-2017   with dilatation esophageal stricture and savary dilatation  . VAGINAL HYSTERECTOMY  1988    no ovaries removed    Family History  Problem Relation Age of Onset  . Stroke Father        family hx of M 1st degree relative <50  . Coronary artery disease Mother   . Heart disease Mother   . Depression Brother   . Stroke Brother   . Diabetes Brother   . Cancer Brother        bladder with mets  . Diabetes Daughter        borderline  . Hypertension Daughter   . Arthritis Other        family hx of  . Hypertension Other        family hx of  . Other Other        family hx of cardiovascular disorder  . Thyroid disease Daughter   . Breast cancer Neg Hx   . Colon cancer Neg Hx   . Anesthesia problems Neg Hx   . Hypotension Neg Hx   . Malignant hyperthermia Neg Hx   . Pseudochol deficiency Neg Hx   . Colon polyps Neg Hx   . Esophageal cancer Neg Hx   . Rectal cancer Neg Hx   . Stomach cancer Neg Hx     Social History:  reports that she has never smoked. She has never used smokeless tobacco. She reports that she does not drink alcohol and does not use drugs. retired, widowed  Allergies:  Allergies  Allergen Reactions  . Baclofen Other (See Comments)    Hyperactivity   . Nitrofurantoin Rash    Meds: amlodipine, ASA, Pepcid, omeprazole, isosorbide, losartan, myrbetriq, metoprolol, pramiprazole, simvastatin    Review of Systems  Constitutional: Negative.   HENT: Negative.   Eyes: Negative.   Respiratory: Negative.   Cardiovascular: Negative.   Gastrointestinal: Positive for abdominal pain.  Genitourinary: Positive for pelvic pain.  Musculoskeletal: Negative.   Skin: Negative.   Neurological: Negative.   Psychiatric/Behavioral: Negative.     Height 5\' 3"  (1.6 m), weight 72.6 kg. Physical Exam  Constitutional: She is oriented to person, place, and time.  HENT:   Head: Normocephalic and atraumatic.  Cardiovascular: Normal rate.  Respiratory: Effort normal and breath sounds normal.  GI: Soft. Normal appearance and bowel sounds are normal.  Genitourinary:    Vulva normal.   Musculoskeletal:        General: No swelling or tenderness. Normal range of motion.  Neurological: She is alert and oriented to person, place, and time.  Skin:  Skin is warm and dry.  Psychiatric: Her behavior is normal. Mood normal.  Grade 3+ pelvic relaxation  No results found for this or any previous visit (from the past 24 hour(s)).  No results found.  Assessment/Plan: 85yo wGrade 3 anterior and posterior prolapse for Anterior and posterior repair.  Unable to tolerate or maintain pessary - Severely limiting daily activity. D/W pt r/b/a of surgery and process - will proceed  Zalman Hull Bovard-Stuckert 12/21/2019, 7:25 PM

## 2019-12-22 ENCOUNTER — Encounter (HOSPITAL_BASED_OUTPATIENT_CLINIC_OR_DEPARTMENT_OTHER)
Admission: RE | Disposition: A | Discharge status: Home / Self Care | Payer: Self-pay | Source: Home / Self Care | Attending: Obstetrics and Gynecology

## 2019-12-22 ENCOUNTER — Observation Stay (HOSPITAL_BASED_OUTPATIENT_CLINIC_OR_DEPARTMENT_OTHER)
Admission: RE | Admit: 2019-12-22 | Discharge: 2019-12-23 | Disposition: A | Discharge status: Home / Self Care | Payer: Medicare HMO | Attending: Obstetrics and Gynecology | Admitting: Obstetrics and Gynecology

## 2019-12-22 ENCOUNTER — Encounter (HOSPITAL_BASED_OUTPATIENT_CLINIC_OR_DEPARTMENT_OTHER): Payer: Self-pay | Admitting: Obstetrics and Gynecology

## 2019-12-22 ENCOUNTER — Observation Stay (HOSPITAL_BASED_OUTPATIENT_CLINIC_OR_DEPARTMENT_OTHER): Payer: Medicare HMO | Admitting: Anesthesiology

## 2019-12-22 ENCOUNTER — Observation Stay (HOSPITAL_BASED_OUTPATIENT_CLINIC_OR_DEPARTMENT_OTHER): Payer: Medicare HMO | Admitting: Physician Assistant

## 2019-12-22 DIAGNOSIS — Z9841 Cataract extraction status, right eye: Secondary | ICD-10-CM | POA: Diagnosis not present

## 2019-12-22 DIAGNOSIS — I252 Old myocardial infarction: Secondary | ICD-10-CM | POA: Diagnosis not present

## 2019-12-22 DIAGNOSIS — Z8052 Family history of malignant neoplasm of bladder: Secondary | ICD-10-CM | POA: Insufficient documentation

## 2019-12-22 DIAGNOSIS — Z90710 Acquired absence of both cervix and uterus: Secondary | ICD-10-CM | POA: Insufficient documentation

## 2019-12-22 DIAGNOSIS — N8111 Cystocele, midline: Secondary | ICD-10-CM | POA: Diagnosis not present

## 2019-12-22 DIAGNOSIS — E119 Type 2 diabetes mellitus without complications: Secondary | ICD-10-CM | POA: Insufficient documentation

## 2019-12-22 DIAGNOSIS — M1990 Unspecified osteoarthritis, unspecified site: Secondary | ICD-10-CM | POA: Diagnosis not present

## 2019-12-22 DIAGNOSIS — Z79899 Other long term (current) drug therapy: Secondary | ICD-10-CM | POA: Insufficient documentation

## 2019-12-22 DIAGNOSIS — Z96653 Presence of artificial knee joint, bilateral: Secondary | ICD-10-CM | POA: Insufficient documentation

## 2019-12-22 DIAGNOSIS — Z7982 Long term (current) use of aspirin: Secondary | ICD-10-CM | POA: Insufficient documentation

## 2019-12-22 DIAGNOSIS — K449 Diaphragmatic hernia without obstruction or gangrene: Secondary | ICD-10-CM | POA: Diagnosis not present

## 2019-12-22 DIAGNOSIS — F329 Major depressive disorder, single episode, unspecified: Secondary | ICD-10-CM | POA: Insufficient documentation

## 2019-12-22 DIAGNOSIS — Z8711 Personal history of peptic ulcer disease: Secondary | ICD-10-CM | POA: Insufficient documentation

## 2019-12-22 DIAGNOSIS — I69398 Other sequelae of cerebral infarction: Secondary | ICD-10-CM | POA: Diagnosis not present

## 2019-12-22 DIAGNOSIS — G250 Essential tremor: Secondary | ICD-10-CM | POA: Diagnosis not present

## 2019-12-22 DIAGNOSIS — Z9842 Cataract extraction status, left eye: Secondary | ICD-10-CM | POA: Diagnosis not present

## 2019-12-22 DIAGNOSIS — N8189 Other female genital prolapse: Secondary | ICD-10-CM | POA: Diagnosis not present

## 2019-12-22 DIAGNOSIS — Z8541 Personal history of malignant neoplasm of cervix uteri: Secondary | ICD-10-CM | POA: Insufficient documentation

## 2019-12-22 DIAGNOSIS — N393 Stress incontinence (female) (male): Secondary | ICD-10-CM | POA: Diagnosis not present

## 2019-12-22 DIAGNOSIS — G2581 Restless legs syndrome: Secondary | ICD-10-CM | POA: Insufficient documentation

## 2019-12-22 DIAGNOSIS — F419 Anxiety disorder, unspecified: Secondary | ICD-10-CM | POA: Diagnosis not present

## 2019-12-22 DIAGNOSIS — I4510 Unspecified right bundle-branch block: Secondary | ICD-10-CM | POA: Diagnosis not present

## 2019-12-22 DIAGNOSIS — I2510 Atherosclerotic heart disease of native coronary artery without angina pectoris: Secondary | ICD-10-CM | POA: Insufficient documentation

## 2019-12-22 DIAGNOSIS — E782 Mixed hyperlipidemia: Secondary | ICD-10-CM | POA: Insufficient documentation

## 2019-12-22 DIAGNOSIS — R002 Palpitations: Secondary | ICD-10-CM | POA: Insufficient documentation

## 2019-12-22 DIAGNOSIS — Z823 Family history of stroke: Secondary | ICD-10-CM | POA: Insufficient documentation

## 2019-12-22 DIAGNOSIS — Z955 Presence of coronary angioplasty implant and graft: Secondary | ICD-10-CM | POA: Diagnosis not present

## 2019-12-22 DIAGNOSIS — Z888 Allergy status to other drugs, medicaments and biological substances status: Secondary | ICD-10-CM | POA: Insufficient documentation

## 2019-12-22 DIAGNOSIS — K219 Gastro-esophageal reflux disease without esophagitis: Secondary | ICD-10-CM | POA: Insufficient documentation

## 2019-12-22 DIAGNOSIS — Z8349 Family history of other endocrine, nutritional and metabolic diseases: Secondary | ICD-10-CM | POA: Insufficient documentation

## 2019-12-22 DIAGNOSIS — F418 Other specified anxiety disorders: Secondary | ICD-10-CM | POA: Diagnosis not present

## 2019-12-22 DIAGNOSIS — Z881 Allergy status to other antibiotic agents status: Secondary | ICD-10-CM | POA: Insufficient documentation

## 2019-12-22 DIAGNOSIS — M810 Age-related osteoporosis without current pathological fracture: Secondary | ICD-10-CM | POA: Diagnosis not present

## 2019-12-22 DIAGNOSIS — E876 Hypokalemia: Secondary | ICD-10-CM | POA: Diagnosis not present

## 2019-12-22 DIAGNOSIS — I10 Essential (primary) hypertension: Secondary | ICD-10-CM | POA: Diagnosis not present

## 2019-12-22 DIAGNOSIS — Z961 Presence of intraocular lens: Secondary | ICD-10-CM | POA: Insufficient documentation

## 2019-12-22 DIAGNOSIS — Z8249 Family history of ischemic heart disease and other diseases of the circulatory system: Secondary | ICD-10-CM | POA: Insufficient documentation

## 2019-12-22 DIAGNOSIS — Z8261 Family history of arthritis: Secondary | ICD-10-CM | POA: Insufficient documentation

## 2019-12-22 DIAGNOSIS — N819 Female genital prolapse, unspecified: Secondary | ICD-10-CM | POA: Diagnosis present

## 2019-12-22 DIAGNOSIS — Z833 Family history of diabetes mellitus: Secondary | ICD-10-CM | POA: Insufficient documentation

## 2019-12-22 DIAGNOSIS — Z86718 Personal history of other venous thrombosis and embolism: Secondary | ICD-10-CM | POA: Insufficient documentation

## 2019-12-22 DIAGNOSIS — N814 Uterovaginal prolapse, unspecified: Secondary | ICD-10-CM | POA: Diagnosis not present

## 2019-12-22 HISTORY — DX: Stress incontinence (female) (male): N39.3

## 2019-12-22 HISTORY — DX: Type 2 diabetes mellitus without complications: E11.9

## 2019-12-22 HISTORY — DX: Personal history of peptic ulcer disease: Z87.11

## 2019-12-22 HISTORY — DX: Other female genital prolapse: N81.89

## 2019-12-22 HISTORY — DX: Personal history of other diseases of the digestive system: Z87.19

## 2019-12-22 HISTORY — DX: Diaphragmatic hernia without obstruction or gangrene: K44.9

## 2019-12-22 HISTORY — DX: Mixed hyperlipidemia: E78.2

## 2019-12-22 HISTORY — PX: ANTERIOR AND POSTERIOR REPAIR: SHX5121

## 2019-12-22 HISTORY — DX: Restless legs syndrome: G25.81

## 2019-12-22 HISTORY — DX: Bifascicular block: I45.2

## 2019-12-22 HISTORY — DX: Unspecified osteoarthritis, unspecified site: M19.90

## 2019-12-22 HISTORY — DX: Iliotibial band syndrome, unspecified leg: M76.30

## 2019-12-22 HISTORY — DX: Personal history of malignant neoplasm of cervix uteri: Z85.41

## 2019-12-22 HISTORY — DX: Nontoxic multinodular goiter: E04.2

## 2019-12-22 LAB — TYPE AND SCREEN
ABO/RH(D): O NEG
Antibody Screen: NEGATIVE

## 2019-12-22 LAB — GLUCOSE, CAPILLARY
Glucose-Capillary: 158 mg/dL — ABNORMAL HIGH (ref 70–99)
Glucose-Capillary: 148 mg/dL — ABNORMAL HIGH (ref 70–99)

## 2019-12-22 SURGERY — ANTERIOR (CYSTOCELE) AND POSTERIOR REPAIR (RECTOCELE)
Anesthesia: Spinal | Site: Vagina

## 2019-12-22 MED ORDER — OXYCODONE HCL 5 MG PO TABS
ORAL_TABLET | ORAL | Status: AC
  Filled 2019-12-22: qty 1

## 2019-12-22 MED ORDER — ONDANSETRON HCL 4 MG PO TABS: 4.0000 mg | ORAL_TABLET | Freq: Four times a day (QID) | ORAL | Status: DC | PRN

## 2019-12-22 MED ORDER — CEFAZOLIN SODIUM-DEXTROSE 2-4 GM/100ML-% IV SOLN
2.0000 g | INTRAVENOUS | Status: AC
  Administered 2019-12-22: 2 g via INTRAVENOUS

## 2019-12-22 MED ORDER — FENTANYL CITRATE (PF) 100 MCG/2ML IJ SOLN: 25.0000 ug | INTRAMUSCULAR | Status: DC | PRN

## 2019-12-22 MED ORDER — NALOXONE HCL 0.4 MG/ML IJ SOLN: 0.4000 mg | INTRAMUSCULAR | Status: DC | PRN

## 2019-12-22 MED ORDER — FENTANYL CITRATE (PF) 100 MCG/2ML IJ SOLN
INTRAMUSCULAR | Status: DC | PRN
  Administered 2019-12-22 (×2): 12.5 ug via INTRAVENOUS
  Administered 2019-12-22: 25 ug via INTRAVENOUS

## 2019-12-22 MED ORDER — ONDANSETRON 4 MG PO TBDP: 4.0000 mg | ORAL_TABLET | Freq: Four times a day (QID) | ORAL | Status: DC | PRN

## 2019-12-22 MED ORDER — LIDOCAINE HCL (CARDIAC) PF 100 MG/5ML IV SOSY
PREFILLED_SYRINGE | INTRAVENOUS | Status: DC | PRN
  Administered 2019-12-22: 20 mg via INTRAVENOUS

## 2019-12-22 MED ORDER — IBUPROFEN 800 MG PO TABS
ORAL_TABLET | ORAL | Status: AC
  Filled 2019-12-22: qty 1

## 2019-12-22 MED ORDER — OXYCODONE HCL 5 MG PO TABS
5.0000 mg | ORAL_TABLET | ORAL | Status: DC | PRN
  Administered 2019-12-22 (×2): 5 mg via ORAL

## 2019-12-22 MED ORDER — SIMETHICONE 80 MG PO CHEW: 80.0000 mg | CHEWABLE_TABLET | Freq: Four times a day (QID) | ORAL | Status: DC | PRN

## 2019-12-22 MED ORDER — KETOROLAC TROMETHAMINE 15 MG/ML IJ SOLN
INTRAMUSCULAR | Status: AC
  Filled 2019-12-22: qty 1

## 2019-12-22 MED ORDER — DIPHENHYDRAMINE HCL 50 MG/ML IJ SOLN: 12.5000 mg | Freq: Four times a day (QID) | INTRAMUSCULAR | Status: DC | PRN

## 2019-12-22 MED ORDER — DIPHENHYDRAMINE HCL 12.5 MG/5ML PO ELIX: 12.5000 mg | ORAL_SOLUTION | Freq: Four times a day (QID) | ORAL | Status: DC | PRN

## 2019-12-22 MED ORDER — MAGNESIUM OXIDE 400 MG PO TABS
400.0000 mg | ORAL_TABLET | Freq: Two times a day (BID) | ORAL | Status: DC
  Administered 2019-12-22 (×2): 400 mg via ORAL
  Filled 2019-12-22: qty 1

## 2019-12-22 MED ORDER — OXYCODONE-ACETAMINOPHEN 5-325 MG PO TABS: 1.0000 | ORAL_TABLET | ORAL | Status: DC | PRN

## 2019-12-22 MED ORDER — ONDANSETRON HCL 4 MG/2ML IJ SOLN: 4.0000 mg | Freq: Four times a day (QID) | INTRAMUSCULAR | Status: DC | PRN

## 2019-12-22 MED ORDER — ENOXAPARIN SODIUM 40 MG/0.4ML ~~LOC~~ SOLN
40.0000 mg | Freq: Once | SUBCUTANEOUS | Status: AC
  Administered 2019-12-22: 40 mg via SUBCUTANEOUS

## 2019-12-22 MED ORDER — ASPIRIN EC 81 MG PO TBEC
81.0000 mg | DELAYED_RELEASE_TABLET | Freq: Every day | ORAL | Status: DC
  Administered 2019-12-22: 81 mg via ORAL
  Filled 2019-12-22: qty 1

## 2019-12-22 MED ORDER — LACTATED RINGERS IV SOLN: INTRAVENOUS | Status: DC

## 2019-12-22 MED ORDER — SODIUM CHLORIDE 0.9% FLUSH: 9.0000 mL | INTRAVENOUS | Status: DC | PRN

## 2019-12-22 MED ORDER — PANTOPRAZOLE SODIUM 40 MG PO TBEC: 40.0000 mg | DELAYED_RELEASE_TABLET | Freq: Every day | ORAL | Status: DC

## 2019-12-22 MED ORDER — HYDROMORPHONE 1 MG/ML IV SOLN: INTRAVENOUS | Status: DC

## 2019-12-22 MED ORDER — CEFAZOLIN SODIUM-DEXTROSE 2-4 GM/100ML-% IV SOLN
INTRAVENOUS | Status: AC
  Filled 2019-12-22: qty 100

## 2019-12-22 MED ORDER — PROPOFOL 10 MG/ML IV BOLUS
INTRAVENOUS | Status: AC
  Filled 2019-12-22: qty 20

## 2019-12-22 MED ORDER — VASOPRESSIN 20 UNIT/ML IV SOLN
INTRAVENOUS | Status: DC | PRN
  Administered 2019-12-22: 10 mL via INTRAMUSCULAR

## 2019-12-22 MED ORDER — ONDANSETRON HCL 4 MG/2ML IJ SOLN
INTRAMUSCULAR | Status: DC | PRN
  Administered 2019-12-22: 4 mg via INTRAVENOUS

## 2019-12-22 MED ORDER — SIMVASTATIN 20 MG PO TABS
20.0000 mg | ORAL_TABLET | Freq: Every day | ORAL | Status: DC
  Administered 2019-12-22: 20 mg via ORAL
  Filled 2019-12-22: qty 1

## 2019-12-22 MED ORDER — GUAIFENESIN 100 MG/5ML PO SOLN
15.0000 mL | ORAL | Status: DC | PRN
  Filled 2019-12-22: qty 15

## 2019-12-22 MED ORDER — FENTANYL CITRATE (PF) 100 MCG/2ML IJ SOLN
INTRAMUSCULAR | Status: AC
  Filled 2019-12-22: qty 2

## 2019-12-22 MED ORDER — HYOSCYAMINE SULFATE 0.125 MG SL SUBL: 0.1250 mg | SUBLINGUAL_TABLET | SUBLINGUAL | Status: DC | PRN

## 2019-12-22 MED ORDER — PHENYLEPHRINE 40 MCG/ML (10ML) SYRINGE FOR IV PUSH (FOR BLOOD PRESSURE SUPPORT)
PREFILLED_SYRINGE | INTRAVENOUS | Status: AC
  Filled 2019-12-22: qty 10

## 2019-12-22 MED ORDER — MENTHOL 3 MG MT LOZG: 1.0000 | LOZENGE | OROMUCOSAL | Status: DC | PRN

## 2019-12-22 MED ORDER — PRAMIPEXOLE DIHYDROCHLORIDE 0.25 MG PO TABS
0.2500 mg | ORAL_TABLET | Freq: Two times a day (BID) | ORAL | Status: DC
  Administered 2019-12-22: 0.25 mg via ORAL
  Filled 2019-12-22: qty 1

## 2019-12-22 MED ORDER — POTASSIUM CHLORIDE CRYS ER 20 MEQ PO TBCR
20.0000 meq | EXTENDED_RELEASE_TABLET | Freq: Every day | ORAL | Status: DC
  Administered 2019-12-22: 20 meq via ORAL
  Filled 2019-12-22: qty 1

## 2019-12-22 MED ORDER — KETOROLAC TROMETHAMINE 15 MG/ML IJ SOLN
15.0000 mg | Freq: Four times a day (QID) | INTRAMUSCULAR | Status: DC
  Administered 2019-12-22 – 2019-12-23 (×4): 15 mg via INTRAVENOUS

## 2019-12-22 MED ORDER — PHENYLEPHRINE HCL (PRESSORS) 10 MG/ML IV SOLN
INTRAVENOUS | Status: DC | PRN
  Administered 2019-12-22 (×3): 80 ug via INTRAVENOUS

## 2019-12-22 MED ORDER — HYDROMORPHONE HCL 1 MG/ML IJ SOLN: 0.2000 mg | INTRAMUSCULAR | Status: DC | PRN

## 2019-12-22 MED ORDER — ALUM & MAG HYDROXIDE-SIMETH 200-200-20 MG/5ML PO SUSP: 30.0000 mL | ORAL | Status: DC | PRN

## 2019-12-22 MED ORDER — ACETAMINOPHEN 10 MG/ML IV SOLN: 1000.0000 mg | Freq: Once | INTRAVENOUS | Status: DC | PRN

## 2019-12-22 MED ORDER — LOSARTAN POTASSIUM 50 MG PO TABS
100.0000 mg | ORAL_TABLET | Freq: Every day | ORAL | Status: DC
  Administered 2019-12-22: 100 mg via ORAL
  Filled 2019-12-22: qty 2

## 2019-12-22 MED ORDER — ACETAMINOPHEN 500 MG PO TABS
ORAL_TABLET | ORAL | Status: AC
  Filled 2019-12-22: qty 2

## 2019-12-22 MED ORDER — PROPOFOL 500 MG/50ML IV EMUL
INTRAVENOUS | Status: DC | PRN
  Administered 2019-12-22: 1800 ug via INTRAVENOUS
  Administered 2019-12-22: 25 ug/kg/min via INTRAVENOUS

## 2019-12-22 MED ORDER — BUPIVACAINE IN DEXTROSE 0.75-8.25 % IT SOLN
INTRATHECAL | Status: DC | PRN
Start: 2019-12-22 — End: 2019-12-22
  Administered 2019-12-22: 10.5 mg via INTRATHECAL

## 2019-12-22 MED ORDER — ACETAMINOPHEN 500 MG PO TABS
1000.0000 mg | ORAL_TABLET | Freq: Four times a day (QID) | ORAL | Status: DC | PRN
  Administered 2019-12-22: 1000 mg via ORAL

## 2019-12-22 MED ORDER — ESTRADIOL 0.1 MG/GM VA CREA
TOPICAL_CREAM | VAGINAL | Status: DC | PRN
  Administered 2019-12-22: 1 via VAGINAL

## 2019-12-22 MED ORDER — AMLODIPINE BESYLATE 5 MG PO TABS
5.0000 mg | ORAL_TABLET | Freq: Every day | ORAL | Status: DC
  Filled 2019-12-22: qty 1

## 2019-12-22 MED ORDER — METOPROLOL SUCCINATE ER 50 MG PO TB24
50.0000 mg | ORAL_TABLET | Freq: Every day | ORAL | Status: DC
  Administered 2019-12-22: 50 mg via ORAL
  Filled 2019-12-22: qty 1

## 2019-12-22 MED ORDER — ISOSORBIDE MONONITRATE ER 30 MG PO TB24
30.0000 mg | ORAL_TABLET | Freq: Every day | ORAL | Status: DC
  Filled 2019-12-22: qty 1

## 2019-12-22 MED ORDER — IBUPROFEN 800 MG PO TABS: 800.0000 mg | ORAL_TABLET | Freq: Three times a day (TID) | ORAL | Status: DC | PRN

## 2019-12-22 MED ORDER — ONDANSETRON HCL 4 MG/2ML IJ SOLN: 4.0000 mg | Freq: Once | INTRAMUSCULAR | Status: DC | PRN

## 2019-12-22 MED ORDER — ONDANSETRON HCL 4 MG/2ML IJ SOLN
INTRAMUSCULAR | Status: AC
  Filled 2019-12-22: qty 2

## 2019-12-22 MED ORDER — LIDOCAINE 2% (20 MG/ML) 5 ML SYRINGE
INTRAMUSCULAR | Status: AC
  Filled 2019-12-22: qty 5

## 2019-12-22 MED ORDER — ENOXAPARIN SODIUM 40 MG/0.4ML ~~LOC~~ SOLN
SUBCUTANEOUS | Status: AC
  Filled 2019-12-22: qty 0.4

## 2019-12-22 SURGICAL SUPPLY — 23 items
BLADE SURG 15 STRL LF DISP TIS (BLADE) ×1 IMPLANT
BLADE SURG 15 STRL SS (BLADE) ×2
CNTNR URN SCR LID CUP LEK RST (MISCELLANEOUS) IMPLANT
CONT SPEC 4OZ STRL OR WHT (MISCELLANEOUS) ×2
DECANTER SPIKE VIAL GLASS SM (MISCELLANEOUS) IMPLANT
GAUZE 4X4 16PLY RFD (DISPOSABLE) ×1 IMPLANT
GAUZE PACKING 1/2X5YD (GAUZE/BANDAGES/DRESSINGS) ×2 IMPLANT
GLOVE BIO SURGEON STRL SZ 6.5 (GLOVE) ×2 IMPLANT
GOWN STRL REUS W/TWL LRG LVL3 (GOWN DISPOSABLE) ×2 IMPLANT
KIT TURNOVER CYSTO (KITS) ×2 IMPLANT
NDL MAYO CATGUT SZ4 TPR NDL (NEEDLE) IMPLANT
NEEDLE HYPO 22GX1.5 SAFETY (NEEDLE) ×2 IMPLANT
NEEDLE MAYO CATGUT SZ4 (NEEDLE) IMPLANT
NS IRRIG 1000ML POUR BTL (IV SOLUTION) ×2 IMPLANT
PACK VAGINAL WOMENS (CUSTOM PROCEDURE TRAY) ×2 IMPLANT
SUT VIC AB 2-0 CT2 27 (SUTURE) ×7 IMPLANT
SUT VIC AB 3-0 CT1 27 (SUTURE)
SUT VIC AB 3-0 CT1 TAPERPNT 27 (SUTURE) IMPLANT
SUT VIC AB 3-0 SH 27 (SUTURE) ×6
SUT VIC AB 3-0 SH 27X BRD (SUTURE) ×2 IMPLANT
SUT VICRYL 0 UR6 27IN ABS (SUTURE) IMPLANT
TOWEL OR 17X26 10 PK STRL BLUE (TOWEL DISPOSABLE) ×2 IMPLANT
TRAY FOLEY W/BAG SLVR 14FR (SET/KITS/TRAYS/PACK) ×2 IMPLANT

## 2019-12-22 NOTE — Anesthesia Procedure Notes (Signed)
Procedure Name: MAC Date/Time: 12/22/2019 8:02 AM Performed by: Justice Rocher, CRNA Pre-anesthesia Checklist: Patient identified, Emergency Drugs available, Suction available, Patient being monitored and Timeout performed Patient Re-evaluated:Patient Re-evaluated prior to induction Oxygen Delivery Method: Simple face mask Preoxygenation: Pre-oxygenation with 100% oxygen Induction Type: IV induction Placement Confirmation: positive ETCO2,  CO2 detector and breath sounds checked- equal and bilateral

## 2019-12-22 NOTE — Progress Notes (Signed)
Pt states PAT nurse and surgeons office instructed her to bring home medications in pharmacy bottles. Discussed hospital policy regarding home medications, pt verbalizes understanding but still refusing to take hospital medications due to charge. Will take home medications today as prescribed per MD with nurse present and will not take any medications otherwise prescribed by Dr. Roe Rutherford postoperative orders.

## 2019-12-22 NOTE — Interval H&P Note (Signed)
History and Physical Interval Note:  12/22/2019 7:43 AM  Gallina  has presented today for surgery, with the diagnosis of prolapse.  The various methods of treatment have been discussed with the patient and family. After consideration of risks, benefits and other options for treatment, the patient has consented to  Procedure(s): ANTERIOR (CYSTOCELE) AND POSTERIOR REPAIR (RECTOCELE) (N/A) as a surgical intervention.  The patient's history has been reviewed, patient examined, no change in status, stable for surgery.  I have reviewed the patient's chart and labs.  Questions were answered to the patient's satisfaction.     Kallan Merrick Bovard-Stuckert

## 2019-12-22 NOTE — Progress Notes (Signed)
Day of Surgery Procedure(s) (LRB): ANTERIOR (CYSTOCELE)  REPAIR (N/A)  Subjective: Patient reports incisional pain and tolerating PO  Some pain with packing - replaced.    Objective: I have reviewed patient's vital signs, intake and output and medications.  General: alert and no distress Resp: clear to auscultation bilaterally Cardio: regular rate and rhythm GI: soft, non-tender; bowel sounds normal; no masses,  no organomegaly and hernia at umbilicus Extremities: extremities normal, atraumatic, no cyanosis or edema Vaginal pack removed - minimal VB, replaced  Assessment: s/p Procedure(s): ANTERIOR (CYSTOCELE)  REPAIR (N/A): stable, progressing well, tolerating diet and ambulating well  Plan: Advance diet Encourage ambulation  Continue pain control w po meds D/C pack and foley at 4am, plan for discharge in AM  LOS: 0 days    Heather [name] 12/22/2019, 3:42 PM

## 2019-12-22 NOTE — Op Note (Signed)
NAMEERICHA, [place] MEDICAL RECORD [id number] ACCOUNT [id number] DATE OF BIRTH:[birth year] FACILITY: [facility] LOCATION: [facility] PHYSICIAN:Rajean Desantiago BOVARD-STUCKERT, MD  OPERATIVE REPORT  DATE OF PROCEDURE:  12/22/2019  PREOPERATIVE DIAGNOSIS:  Anterior vaginal prolapse.  POSTOPERATIVE DIAGNOSIS:  Anterior vaginal prolapse.  PROCEDURE:  Anterior colporrhaphy.  SURGEON:  Thornell Sartorius, MD  ASSISTANT:  Eula Flax, MD  ANESTHESIA:  Spinal and IV sedation.  ESTIMATED BLOOD LOSS:  Approximately 10 mL.  URINE OUTPUT INTRAVENOUS FLUIDS:  Per anesthesia.  COMPLICATIONS:  None.  PATHOLOGY:  None.  DESCRIPTION OF PROCEDURE:  After informed consent was reviewed with the patient including risks, benefits and alternatives of the surgical procedure, she was taken to the OR, placed on the table in a seated position and spinal anesthesia was placed and  found to be adequate.  She was then returned to the supine position and placed in the [place] stirrups, prepped and draped in the normal sterile fashion. On OR inspection 2-3+ midline anterior relaxation, posterior relaxation 1+.  IV sedation was obtained after the level of the spinal was noted to be adequate.  The vaginal cuff was grasped with Allises and using Metzenbaum scissors, the mucosa was incised.  It was then undermined and the mucosa was separated in the midline to the  level of 2-3 cm below the urethral orifice.  The tissue was then removed revealing the edge of the endopelvic fascia.  This was reapproximated in the midline with 2-0 Vicryl in 2 layers.  The mucosa was then trimmed and reapproximated with 3-0 Vicryl.   The posterior aspect was inspected and found to not need a repair.  The vagina was packed with esterase-coated vaginal packing and a catheter was placed in the urethra.  The patient tolerated the procedure well.  Sponge, lap and needle counts were  correct x2 per the operating staff.  CN/NUANCE  D:12/22/2019  T:12/22/2019 JOB:[id number]

## 2019-12-22 NOTE — Anesthesia Postprocedure Evaluation (Signed)
Anesthesia Post Note  Patient: Heather [name]  Procedure(s) Performed: ANTERIOR (CYSTOCELE)  REPAIR (N/A Vagina )     Patient location during evaluation: PACU Anesthesia Type: Spinal Level of consciousness: oriented and awake and alert Pain management: pain level controlled Vital Signs Assessment: post-procedure vital signs reviewed and stable Respiratory status: spontaneous breathing, respiratory function stable and patient connected to nasal cannula oxygen Cardiovascular status: blood pressure returned to baseline and stable Postop Assessment: no headache, no backache and no apparent nausea or vomiting Anesthetic complications: no   No complications documented.  Last Vitals:  Vitals:   12/22/19 0918 12/22/19 0930  BP: (!) 117/51 (!) 134/56  Pulse: (!) 55 (!) 52  Resp: 16 12  Temp: (!) 36.3 C   SpO2: 92% 95%    Last Pain:  Vitals:   12/22/19 0918  TempSrc:   PainSc: 0-No pain                 Barnet Glasgow

## 2019-12-22 NOTE — Anesthesia Procedure Notes (Signed)
Spinal  Patient location during procedure: OR Start time: 12/22/2019 7:54 AM End time: 12/22/2019 7:58 AM Staffing Anesthesiologist: Barnet Glasgow, MD Preanesthetic Checklist Completed: patient identified, IV checked, site marked, risks and benefits discussed, surgical consent, monitors and equipment checked, pre-op evaluation and timeout performed Spinal Block Patient position: sitting Prep: DuraPrep Patient monitoring: heart rate, cardiac monitor, continuous pulse ox and blood pressure Approach: midline Location: L3-4 Injection technique: single-shot Needle Needle type: Sprotte  Needle gauge: 24 G Needle length: 9 cm Needle insertion depth: 7 cm Assessment Sensory level: T6 Additional Notes 1 attempt Patient tolerated procedure well

## 2019-12-22 NOTE — Progress Notes (Signed)
Pt complains of feelings of "bottom falling out" upon assessment vaginal packing partially out of vagina. Dr. Melba Coon made aware, order given to reinsert packing into vaginal vault, performed without difficulty, pt tolerated well. Will continue to check and reinsert packing when necessary.

## 2019-12-22 NOTE — Brief Op Note (Signed)
12/22/2019  9:15 AM  PATIENT:  Heather [name]  85 y.o. female  PRE-OPERATIVE DIAGNOSIS:  prolapse  POST-OPERATIVE DIAGNOSIS:  prolapse  PROCEDURE:  Procedure(s): ANTERIOR (CYSTOCELE) AND POSTERIOR REPAIR (RECTOCELE) (N/A)  SURGEON:  Surgeon(s) and Role:    * Bovard-Stuckert, Monica Codd, MD - Primary    * Shivaji, Melida Quitter, MD - Assisting  ANESTHESIA:   spinal and IV sedation  EBL:  10 mL IVF and uop per anesthesia  BLOOD ADMINISTERED:none  DRAINS: Urinary Catheter (Foley)   LOCAL MEDICATIONS USED:  OTHER vasopressin  SPECIMEN:  No Specimen  DISPOSITION OF SPECIMEN:  N/A  COUNTS:  YES  TOURNIQUET:  * No tourniquets in log *  DICTATION: .Other Dictation: Dictation Number [id number]  PLAN OF CARE: Admit for overnight observation  PATIENT DISPOSITION:  PACU - hemodynamically stable.   Delay start of Pharmacological VTE agent (>24hrs) due to surgical blood loss or risk of bleeding: not applicable

## 2019-12-22 NOTE — Transfer of Care (Signed)
Immediate Anesthesia Transfer of Care Note  Patient: Heather [name]  Procedure(s) Performed: Procedure(s) (LRB): ANTERIOR (CYSTOCELE) AND POSTERIOR REPAIR (RECTOCELE) (N/A)  Patient Location: PACU  Anesthesia Type: SAB  Level of Consciousness: awake, sedated, patient cooperative and responds to stimulation  Airway & Oxygen Therapy: Patient Spontanous Breathing and Patient connected to Moore 02 and soft FM   Post-op Assessment: Report given to PACU RN, Post -op Vital signs reviewed and stable and Patient moving all extremities  Post vital signs: Reviewed and stable  Complications: No apparent anesthesia complications

## 2019-12-23 ENCOUNTER — Encounter (HOSPITAL_BASED_OUTPATIENT_CLINIC_OR_DEPARTMENT_OTHER): Payer: Self-pay | Admitting: Obstetrics and Gynecology

## 2019-12-23 DIAGNOSIS — G250 Essential tremor: Secondary | ICD-10-CM | POA: Diagnosis not present

## 2019-12-23 DIAGNOSIS — N8111 Cystocele, midline: Secondary | ICD-10-CM | POA: Diagnosis not present

## 2019-12-23 DIAGNOSIS — I2510 Atherosclerotic heart disease of native coronary artery without angina pectoris: Secondary | ICD-10-CM | POA: Diagnosis not present

## 2019-12-23 DIAGNOSIS — F329 Major depressive disorder, single episode, unspecified: Secondary | ICD-10-CM | POA: Diagnosis not present

## 2019-12-23 DIAGNOSIS — I69398 Other sequelae of cerebral infarction: Secondary | ICD-10-CM | POA: Diagnosis not present

## 2019-12-23 DIAGNOSIS — K219 Gastro-esophageal reflux disease without esophagitis: Secondary | ICD-10-CM | POA: Diagnosis not present

## 2019-12-23 DIAGNOSIS — Z8541 Personal history of malignant neoplasm of cervix uteri: Secondary | ICD-10-CM | POA: Diagnosis not present

## 2019-12-23 DIAGNOSIS — E119 Type 2 diabetes mellitus without complications: Secondary | ICD-10-CM | POA: Diagnosis not present

## 2019-12-23 DIAGNOSIS — F419 Anxiety disorder, unspecified: Secondary | ICD-10-CM | POA: Diagnosis not present

## 2019-12-23 LAB — BASIC METABOLIC PANEL
Anion gap: 6 (ref 5–15)
BUN: 18 mg/dL (ref 8–23)
CO2: 27 mmol/L (ref 22–32)
Calcium: 8.7 mg/dL — ABNORMAL LOW (ref 8.9–10.3)
Chloride: 103 mmol/L (ref 98–111)
Creatinine, Ser: 0.77 mg/dL (ref 0.44–1.00)
GFR calc Af Amer: >60 mL/min (ref >60)
GFR calc non Af Amer: >60 mL/min (ref >60)
Glucose, Bld: 127 mg/dL — ABNORMAL HIGH (ref 70–99)
Potassium: 4.1 mmol/L (ref 3.5–5.1)
Sodium: 136 mmol/L (ref 135–145)

## 2019-12-23 LAB — CBC
HCT: 38.5 % (ref 36.0–46.0)
Hemoglobin: 12.7 g/dL (ref 12.0–15.0)
MCH: 31.1 pg (ref 26.0–34.0)
MCHC: 33 g/dL (ref 30.0–36.0)
MCV: 94.4 fL (ref 80.0–100.0)
Platelets: 126 10*3/uL — ABNORMAL LOW (ref 150–400)
RBC: 4.08 MIL/uL (ref 3.87–5.11)
RDW: 12.5 % (ref 11.5–15.5)
WBC: 7.5 10*3/uL (ref 4.0–10.5)
nRBC: 0 % (ref 0.0–0.2)

## 2019-12-23 MED ORDER — OXYCODONE HCL 5 MG PO TABS: 5.0000 mg | ORAL_TABLET | Freq: Four times a day (QID) | ORAL | 0 refills | Status: DC | PRN

## 2019-12-23 MED ORDER — ACETAMINOPHEN 500 MG PO TABS: 1000.0000 mg | ORAL_TABLET | Freq: Four times a day (QID) | ORAL | 0 refills | Status: DC | PRN

## 2019-12-23 MED ORDER — KETOROLAC TROMETHAMINE 15 MG/ML IJ SOLN
INTRAMUSCULAR | Status: AC
  Filled 2019-12-23: qty 1

## 2019-12-23 NOTE — Progress Notes (Signed)
1 Day Post-Op Procedure(s) (LRB): ANTERIOR (CYSTOCELE)  REPAIR (N/A)  Subjective: Patient reports tolerating PO and no problems voiding.  Pain controlled.  States flow is different. Objective: I have reviewed patient's vital signs, intake and output, medications and labs.  General: alert and no distress Resp: clear to auscultation bilaterally Cardio: regular rate and rhythm GI: soft, non-tender; bowel sounds normal; no masses,  no organomegaly Extremities: extremities normal, atraumatic, no cyanosis or edema Vaginal Bleeding: minimal  Assessment: s/p Procedure(s): ANTERIOR (CYSTOCELE)  REPAIR (N/A): stable, progressing well and tolerating diet  Plan: Discharge home.  D/c with Motrin (to use sparingly) and oxycodone.   F/u 2 and 6 weeks  LOS: 0 days    Bashar Milam Bovard-Stuckert 12/23/2019, 8:13 AM

## 2019-12-23 NOTE — Discharge Instructions (Signed)
Anterior and Posterior Colporrhaphy and Sling Procedure, Care After This sheet gives you information about how to care for yourself after your procedure. Your health care provider may also give you more specific instructions. If you have problems or questions, contact your health care provider. What can I expect after the procedure? After the procedure, it is common to have:  Pain in the surgical area.  Vaginal discharge. You will need to use a sanitary pad during this time.  Fatigue. Follow these instructions at home: Incision care   Follow instructions from your health care provider about how to take care of your incision. Make sure you: ? Wash your hands with soap and water before touching the incision area. If soap and water are not available, use hand sanitizer. ? Clean your incision as told by your health care provider. ? Leave stitches (sutures), skin glue, or adhesive strips in place. These skin closures may need to stay in place for 2 weeks or longer. If adhesive strip edges start to loosen and curl up, you may trim the loose edges. Do not remove adhesive strips completely unless your health care provider tells you to do that.  Check your incision area every day for signs of infection. Check for: ? Redness, swelling, or pain. ? Fluid or blood. ? Warmth. ? Pus or a bad smell.  Check your incision every day to make sure the incision area is not separating or opening.  Do not take baths, swim, or use a hot tub until your health care provider approves. You may shower.  Keep the area between your vagina and rectum (perineal area) clean and dry. Make sure you clean the area after each bowel movement and each time you urinate.  Ask your health care provider if you can take a sitz bath or sit in a tub of clean, warm water. Activity  Do gentle, daily activity as told by your health care provider. You may be told to take short walks every day and go farther each time. Ask your health  care provider what activities are safe for you.  Limit stair climbing to once or twice a day in the first week, then slowly increase this activity.  Do not lift anything that is heavier than 10 lbs. (4.5 kg), or the limit that your health care provider tells you, until he or she says that it is safe. Avoid pushing or pulling motions.  Avoid standing for long periods of time.  Do not douche, use tampons, or have sex until your health care provider says it is okay.  Do not drive or use heavy machinery while taking prescription pain medicine. To prevent constipation  To prevent or treat constipation while you are taking prescription pain medicine, your health care provider may recommend that you: ? Take over-the-counter or prescription medicines. ? Eat foods that are high in fiber, such as fresh fruits and vegetables, whole grains, and beans. ? Drink enough fluid to keep your urine clear or pale yellow. ? Limit foods that are high in fat and processed sugars, such as fried and sweet foods. General instructions  You may be instructed to do pelvic floor exercises (kegels) as told by your health care provider.  Take over-the-counter and prescription medicines only as told by your health care provider.  Keep all follow-up visits as told by your health care provider. This is important. Contact a health care provider if:  Medicine does not help your pain.  You have frequent or urgent urination, or   you are unable to completely empty your bladder.  You feel a burning sensation when urinating.  You have fluid or blood coming from your incision.  You have pus or a bad smell coming from the incision.  Your incision feels warm to the touch.  You have redness, swelling, or pain around your incision. Get help right away if:  You have a fever or chills.  Your incision separates or opens.  You cannot urinate.  You have trouble breathing. Summary  After the procedure, it is common to  have pain, fatigue, and discharge from the vagina.  Keep the area between your vagina and rectum (perineal area) clean and dry. Make sure you clean the area after each bowel movement and each time you urinate.  Follow instructions from your health care provider on any activity restrictions after the procedure. This information is not intended to replace advice given to you by your health care provider. Make sure you discuss any questions you have with your health care provider. Document Revised: 06/07/2017 Document Reviewed: 06/25/2016 Elsevier Patient Education  2020 [employer] Inc.  

## 2019-12-23 NOTE — Progress Notes (Signed)
Vaginal packing removed per MD order. Patient tolerated procedure well. Will monitor for first void post foley cath removal.

## 2019-12-23 NOTE — Discharge Summary (Signed)
Physician Discharge Summary  Patient ID: Heather [name] MRN: [id number] DOB/AGE: [birth year] 85 y.o.  Admit date: 12/22/2019 Discharge date: 12/23/2019  Admission Diagnoses: pelvic relaxation  Discharge Diagnoses:  Active Problems:   Prolapse of female pelvic organs   Pelvic relaxation   Discharged Condition: good  Hospital Course: admitted underwent surgery, d/c POD#1 ambulating, voiding, pain controlled  Consults: None  Significant Diagnostic Studies: labs: CBC, CMP  Treatments: surgery: Anterior repair  Discharge Exam: Blood pressure (!) 179/58, pulse (!) 58, temperature 98.2 F (36.8 C), resp. rate 14, height 5\' 3"  (1.6 m), weight 73.5 kg, SpO2 97 %. General appearance: alert and no distress Resp: clear to auscultation bilaterally Cardio: regular rate and rhythm GI: soft, non-tender; bowel sounds normal; no masses,  no organomegaly Extremities: extremities normal, atraumatic, no cyanosis or edema  Disposition: Discharge disposition: 01-Home or Self Care       Discharge Instructions    Call MD for:   Complete by: As directed    Heavy vaginal bleeding (>1pad/hr)   Call MD for:  persistant nausea and vomiting   Complete by: As directed    Call MD for:  severe uncontrolled pain   Complete by: As directed    Diet - low sodium heart healthy   Complete by: As directed    Discharge instructions   Complete by: As directed    Call [phone] with questions or problems   Driving Restrictions   Complete by: As directed    While taking strong pain medicine   Increase activity slowly   Complete by: As directed    Lifting restrictions   Complete by: As directed    Limit lifting to 10 lbs for at least 6 weeks   May shower / Bathe   Complete by: As directed    May walk up steps   Complete by: As directed    No wound care   Complete by: As directed    Sexual Activity Restrictions   Complete by: As directed    Pelvic rest - no douching, tampons or sex for 6 weeks      Allergies as of 12/23/2019      Reactions   Baclofen Other (See Comments)   Hyperactivity   Nitrofurantoin Rash      Medication List    TAKE these medications   acetaminophen 500 MG tablet Commonly known as: TYLENOL Take 2 tablets (1,000 mg total) by mouth every 6 (six) hours as needed for mild pain.   amLODipine 5 MG tablet Commonly known as: NORVASC Take 1 tablet (5 mg total) by mouth daily.   aspirin EC 81 MG tablet Take 1 tablet (81 mg total) by mouth daily.   cholecalciferol 1000 units tablet Commonly known as: VITAMIN D Take 1,000 Units by mouth daily.   hyoscyamine 0.125 MG SL tablet Commonly known as: LEVSIN SL Place 1 tablet (0.125 mg total) under the tongue every 4 (four) hours as needed.   isosorbide mononitrate 60 MG 24 hr tablet Commonly known as: IMDUR TAKE 1/2 TABLET EVERY DAY   losartan 100 MG tablet Commonly known as: COZAAR TAKE 1 TABLET EVERY DAY   magnesium oxide 400 MG tablet Commonly known as: MAG-OX Take 400 mg by mouth 2 (two) times daily.   metoprolol succinate 50 MG 24 hr tablet Commonly known as: TOPROL-XL Take 1 tablet (50 mg total) by mouth at bedtime. Please keep upcoming appt in April for future refills. Thank you   omeprazole 20 MG capsule Commonly known  as: PRILOSEC TAKE 1 CAPSULE EVERY DAY AS NEEDED What changed:   how much to take  how to take this  when to take this   ondansetron 4 MG disintegrating tablet Commonly known as: Zofran ODT Take 1 tablet (4 mg total) by mouth every 6 (six) hours as needed for nausea or vomiting.   oxyCODONE 5 MG immediate release tablet Commonly known as: Oxy IR/ROXICODONE Take 1 tablet (5 mg total) by mouth every 6 (six) hours as needed for severe pain.   Pennsaid 2 % Soln Generic drug: Diclofenac Sodium Place 1 application onto the skin 2 (two) times daily. What changed:   when to take this  reasons to take this   potassium chloride SA 20 MEQ tablet Commonly known as:  KLOR-CON Take 1 tablet (20 mEq total) by mouth daily. Please keep upcoming appt in April for future refills. Thank you What changed: when to take this   pramipexole 0.25 MG tablet Commonly known as: MIRAPEX TAKE 1 TABLET TWICE DAILY What changed: when to take this   simvastatin 20 MG tablet Commonly known as: ZOCOR TAKE 1 TABLET AT BEDTIME   Vitamin C 500 MG Chew Chew by mouth daily.       Follow-up Information    Bovard-Stuckert, Jamont Mellin, MD. Schedule an appointment as soon as possible for a visit in 2 week(s).   Specialty: Obstetrics and Gynecology Why: and 6 weeks for postoperative checks Contact information: [address] [place] [address] [phone]               Signed: Janyth Contes 12/23/2019, 8:29 AM

## 2019-12-31 ENCOUNTER — Other Ambulatory Visit: Payer: Self-pay | Admitting: Cardiovascular Disease

## 2020-01-08 ENCOUNTER — Other Ambulatory Visit: Payer: Self-pay | Admitting: Family Medicine

## 2020-01-08 ENCOUNTER — Telehealth: Payer: Self-pay | Admitting: Family Medicine

## 2020-01-08 NOTE — Progress Notes (Signed)
  Chronic Care Management   Note  01/08/2020 Name: JAKITA DUTKIEWICZ MRN: [id number] DOB: 1935/05/29  KINZA GOUVEIA is a 85 y.o. year old female who is a primary care patient of Mosie Lukes, MD. I reached out to [name] by phone today in response to a referral sent by Ms. Vertell Limber Ong's PCP, Mosie Lukes, MD.   Ms. Louth was given information about Chronic Care Management services today including:  1. CCM service includes personalized support from designated clinical staff supervised by her physician, including individualized plan of care and coordination with other care providers 2. 24/7 contact phone numbers for assistance for urgent and routine care needs. 3. Service will only be billed when office clinical staff spend 20 minutes or more in a month to coordinate care. 4. Only one practitioner may furnish and bill the service in a calendar month. 5. The patient may stop CCM services at any time (effective at the end of the month) by phone call to the office staff.   Patient agreed to services and verbal consent obtained.   Follow up plan:   [place]

## 2020-03-01 ENCOUNTER — Other Ambulatory Visit: Payer: Self-pay | Admitting: Family Medicine

## 2020-03-07 ENCOUNTER — Telehealth: Payer: Self-pay | Admitting: Pharmacist

## 2020-03-07 NOTE — Progress Notes (Addendum)
Chronic Care Management Pharmacy Assistant   Name: Heather [name]  MRN: [id number] DOB: 07/20/1934  Reason for Encounter: Initial Questions  PCP : Mosie Lukes, MD  Allergies:   Allergies  Allergen Reactions  . Baclofen Other (See Comments)    Hyperactivity   . Nitrofurantoin Rash    Medications: Outpatient Encounter Medications as of 03/07/2020  Medication Sig  . acetaminophen (TYLENOL) 500 MG tablet Take 2 tablets (1,000 mg total) by mouth every 6 (six) hours as needed for mild pain.  Marland Kitchen amLODipine (NORVASC) 5 MG tablet Take 1 tablet (5 mg total) by mouth daily. (Patient taking differently: Take 5 mg by mouth daily. )  . Ascorbic Acid (VITAMIN C) 500 MG CHEW Chew by mouth daily.  Marland Kitchen aspirin EC 81 MG tablet Take 1 tablet (81 mg total) by mouth daily.  . cholecalciferol (VITAMIN D) 1000 UNITS tablet Take 1,000 Units by mouth daily.  . Diclofenac Sodium (PENNSAID) 2 % SOLN Place 1 application onto the skin 2 (two) times daily. (Patient taking differently: Place 1 application onto the skin 2 (two) times daily as needed. )  . hyoscyamine (LEVSIN SL) 0.125 MG SL tablet Place 1 tablet (0.125 mg total) under the tongue every 4 (four) hours as needed.  . isosorbide mononitrate (IMDUR) 60 MG 24 hr tablet TAKE 1/2 TABLET EVERY DAY (Patient taking differently: Take 30 mg by mouth daily. )  . losartan (COZAAR) 100 MG tablet Take 1 tablet (100 mg total) by mouth daily.  . magnesium oxide (MAG-OX) 400 MG tablet Take 400 mg by mouth 2 (two) times daily.   . metoprolol succinate (TOPROL-XL) 50 MG 24 hr tablet Take 1 tablet (50 mg total) by mouth daily.  Marland Kitchen omeprazole (PRILOSEC) 20 MG capsule Take 1 capsule (20 mg total) by mouth daily as needed.  . ondansetron (ZOFRAN ODT) 4 MG disintegrating tablet Take 1 tablet (4 mg total) by mouth every 6 (six) hours as needed for nausea or vomiting.  Marland Kitchen oxyCODONE (OXY IR/ROXICODONE) 5 MG immediate release tablet Take 1 tablet (5 mg total) by mouth every 6  (six) hours as needed for severe pain.  . potassium chloride SA (KLOR-CON) 20 MEQ tablet Take 1 tablet (20 mEq total) by mouth daily.  . pramipexole (MIRAPEX) 0.25 MG tablet TAKE 1 TABLET TWICE DAILY (Patient taking differently: Take 0.25 mg by mouth in the morning and at bedtime. )  . simvastatin (ZOCOR) 20 MG tablet TAKE 1 TABLET AT BEDTIME (Patient taking differently: Take 20 mg by mouth at bedtime. )   No facility-administered encounter medications on file as of 03/07/2020.    Current Diagnosis: Patient Active Problem List   Diagnosis Date Noted  . Prolapse of female pelvic organs 12/22/2019  . Pelvic relaxation 12/22/2019  . Greater trochanteric pain syndrome of right lower extremity 10/13/2019  . Left foot pain 03/08/2019  . Pelvic prolapse 03/08/2019  . Pain of breast 01/29/2019  . Overactive bladder 01/29/2019  . Educated about COVID-19 virus infection 12/02/2018  . Hyperlipidemia associated with type 2 diabetes mellitus ([place]) 05/27/2017  . Right shoulder pain 04/29/2015  . Diarrhea 04/17/2015  . Headache 04/17/2015  . Multinodular goiter 01/17/2015  . Peroneal DVT (deep venous thrombosis) ([place]) 12/22/2014  . S/P total knee arthroplasty 11/29/2014  . Arthritis of knee, degenerative 11/19/2014  . Right knee pain 11/19/2014  . Neck pain 10/29/2014  . Insomnia 02/05/2014  . Preventative health care 11/22/2013  . RLS (restless legs syndrome) 10/04/2013  . Lower  urinary tract infectious disease 10/04/2013  . Cataracts, bilateral 04/02/2013  . Leg swelling 01/17/2012  . Hypokalemia 01/05/2012  . Anemia 01/05/2012  . Depression with anxiety 01/05/2012  . Postoperative anemia due to acute blood loss 11/15/2011  . 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 Pre-syncope 10/13/2011  . DM (diabetes mellitus) ([place]) 10/06/2011  . Pulmonary nodule 10/06/2011  . Epigastric pain 10/06/2011  . DJD (degenerative joint disease) of knee 09/18/2011  . It band syndrome, right 08/28/2011    . Renal insufficiency 08/28/2011  . CORONARY ATHEROSCLEROSIS NATIVE CORONARY ARTERY 03/07/2010  . Coronary atherosclerosis 01/18/2009  . FATIGUE 01/18/2009  . PERSISTENT DISORDER INITIATING/MAINTAINING SLEEP 09/09/2008  . DERMATITIS 10/16/2007  . PERIPHERAL EDEMA 10/16/2007  . PALPITATIONS 10/16/2007  . Essential hypertension 04/14/2007  . GERD 04/14/2007  . TRANSIENT ISCHEMIC ATTACK, HX OF 04/14/2007    Goals Addressed   None    Have you seen any other providers since your last visit? Yes Any changes in your medications or health? No Any side effects from any medications? No Do you have an symptoms or problems not managed by your medications? No Any concerns about your health right now? No Has your provider asked that you check blood pressure, blood sugar, or follow special diet at home? No Do you get any type of exercise on a regular basis? Yes, patient states she walks on her treadmill and uses her bci Can you think of a goal you would like to reach for your health? Patient states she would like to lose some weight. Do you have any problems getting your medications? No Is there anything that you would like to discuss during the appointment? None  Please bring medications and supplements to appointment   Follow-Up:  Pharmacist Review   Fanny Skates, [place] Pharmacist Assistant [phone]  Reviewed by: De Blanch, PharmD Clinical Pharmacist [facility] [phone]

## 2020-03-09 ENCOUNTER — Ambulatory Visit: Payer: Medicare HMO | Admitting: Pharmacist

## 2020-03-09 ENCOUNTER — Other Ambulatory Visit: Payer: Self-pay

## 2020-03-09 DIAGNOSIS — I10 Essential (primary) hypertension: Secondary | ICD-10-CM

## 2020-03-09 DIAGNOSIS — E1121 Type 2 diabetes mellitus with diabetic nephropathy: Secondary | ICD-10-CM

## 2020-03-09 DIAGNOSIS — E1169 Type 2 diabetes mellitus with other specified complication: Secondary | ICD-10-CM

## 2020-03-09 NOTE — Chronic Care Management (AMB) (Signed)
Chronic Care Management Pharmacy  Name: Heather [name]  MRN: [id number] DOB: [birth year]  Chief Complaint/ HPI  Heather [name],  85 y.o. , female presents for their Initial CCM visit with the clinical pharmacist via telephone due to COVID-19 Pandemic.  PCP : Mosie Lukes, MD  Their chronic conditions include: Hypertension, Hyperlipidemia/CAD, Diabetes, GERD, RLS  Office Visits: 10/13/19: Visit w/ Dr. [name] -  No med changes noted. Medically cleared for pelvic prolapse surgery.  Consult Visit:  12/16/19: Gertie Fey visit w/ Dr. Havery [name] - Epigastric Pain. Pt not interested in seeing surgery to discuss cholecystectomy.  10/13/19: Sports Med visit w/ Dr. Raeford Razor - It band syndrome. Pennsaid samples given  Medications: Outpatient Encounter Medications as of 03/09/2020  Medication Sig Note   acetaminophen (TYLENOL) 500 MG tablet Take 2 tablets (1,000 mg total) by mouth every 6 (six) hours as needed for mild pain.    Ascorbic Acid (VITAMIN C) 500 MG CHEW Chew by mouth daily.    aspirin EC 81 MG tablet Take 1 tablet (81 mg total) by mouth daily.    Cholecalciferol (VITAMIN D) 50 MCG (2000 UT) CAPS Take 2,000 Units by mouth daily.     Diclofenac Sodium (PENNSAID) 2 % SOLN Place 1 application onto the skin 2 (two) times daily. (Patient taking differently: Place 1 application onto the skin 2 (two) times daily as needed. )    isosorbide mononitrate (IMDUR) 60 MG 24 hr tablet TAKE 1/2 TABLET EVERY Toniann Dickerson (Patient taking differently: Take 30 mg by mouth daily. )    losartan (COZAAR) 100 MG tablet Take 1 tablet (100 mg total) by mouth daily.    magnesium oxide (MAG-OX) 400 MG tablet Take 400 mg by mouth 2 (two) times daily.     metoprolol succinate (TOPROL-XL) 50 MG 24 hr tablet Take 1 tablet (50 mg total) by mouth daily.    omeprazole (PRILOSEC) 20 MG capsule Take 1 capsule (20 mg total) by mouth daily as needed.    potassium chloride SA (KLOR-CON) 20 MEQ tablet Take 1 tablet (20 mEq total)  by mouth daily.    pramipexole (MIRAPEX) 0.25 MG tablet TAKE 1 TABLET TWICE DAILY (Patient taking differently: Take 0.25 mg by mouth in the morning and at bedtime. )    simvastatin (ZOCOR) 20 MG tablet TAKE 1 TABLET AT BEDTIME (Patient taking differently: Take 20 mg by mouth at bedtime. )    [DISCONTINUED] amLODipine (NORVASC) 5 MG tablet Take 1 tablet (5 mg total) by mouth daily. (Patient taking differently: Take 5 mg by mouth daily. )    hyoscyamine (LEVSIN SL) 0.125 MG SL tablet Place 1 tablet (0.125 mg total) under the tongue every 4 (four) hours as needed. 03/09/2020: Can't recall the last time she had to use   ondansetron (ZOFRAN ODT) 4 MG disintegrating tablet Take 1 tablet (4 mg total) by mouth every 6 (six) hours as needed for nausea or vomiting. (Patient not taking: Reported on 03/09/2020)    oxyCODONE (OXY IR/ROXICODONE) 5 MG immediate release tablet Take 1 tablet (5 mg total) by mouth every 6 (six) hours as needed for severe pain. (Patient not taking: Reported on 03/09/2020)    No facility-administered encounter medications on file as of 03/09/2020.   SDOH Screenings   Alcohol Screen:    Last Alcohol Screening Score (AUDIT): Not on file  Depression (PHQ2-9): Low Risk    PHQ-2 Score: 1  Financial Resource Strain: Low Risk    Difficulty of Paying Living Expenses: Not hard at  all  Food Insecurity: No Food Insecurity   Worried About Charity fundraiser in the Last Year: Never true   Ran Out of Food in the Last Year: Never true  Housing:    Last Housing Risk Score: Not on file  Physical Activity: Inactive   Days of Exercise per Week: 0 days   Minutes of Exercise per Session: 0 min  Social Connections:    Frequency of Communication with Friends and Family: Not on file   Frequency of Social Gatherings with Friends and Family: Not on file   Attends Religious Services: Not on file   Active Member of Clubs or Organizations: Not on file   Attends Archivist  Meetings: Not on file   Marital Status: Not on file  Stress:    Feeling of Stress : Not on file  Tobacco Use: Low Risk    Smoking Tobacco Use: Never Smoker   Smokeless Tobacco Use: Never Used  Transportation Needs: No Data processing manager (Medical): No   Lack of Transportation (Non-Medical): No     Current Diagnosis/Assessment:  Goals Addressed            This Visit's Progress    Chronic Care Management Pharmacy Care Plan       CARE PLAN ENTRY (see longitudinal plan of care for additional care plan information)  Current Barriers:   Chronic Disease Management support, education, and care coordination needs related to Hypertension, Hyperlipidemia/CAD, Diabetes, GERD, RLS   Hypertension BP Readings from Last 3 Encounters:  12/23/19 (!) 118/51  12/18/19 (!) 161/62  12/16/19 124/64    Pharmacist Clinical Goal(s): o Over the next 90 days, patient will work with PharmD and providers to maintain BP goal <140/90  Current regimen:   Metoprolol Succinate 46m daily HS  Amlodipine 549mdaily AM  Losartan 10075maily  Interventions: o Requested patient to check blood pressure 2-3 times per week and record o Discussed BP goal  Patient self care activities - Over the next 90 days, patient will: o Check BP 2-3 times per week, document, and provide at future appointments o Ensure daily salt intake < 2300 mg/Sandee Bernath  Hyperlipidemia/CAD Lab Results  Component Value Date/Time   LDLCALC 70 10/13/2019 09:09 AM   LDLDIRECT 73.4 05/20/2014 04:51 PM    Pharmacist Clinical Goal(s): o Over the next 90 days, patient will work with PharmD and providers to maintain LDL goal < 70  Current regimen:   Simvastatin 69m69mily at bedtime  Aspirin 81mg39mterventions: o Discussed LDL goal  Patient self care activities - Over the next 90 days, patient will: o Maintain cholesterol medication regimen.   Diabetes Lab Results  Component Value Date/Time     HGBA1C 7.2 (H) 10/13/2019 09:09 AM   HGBA1C 7.0 (H) 05/30/2018 09:45 AM    Pharmacist Clinical Goal(s): o Over the next 90 days, patient will work with PharmD and providers to maintain A1c goal  <7.5%  Current regimen:  o Diet and exercise management    Interventions: o Discussed diet and exercise  Patient self care activities - Over the next 90 days, patient will: o Maintain a1c goal <7.5%  Medication management  Pharmacist Clinical Goal(s): o Over the next 90 days, patient will work with PharmD and providers to maintain optimal medication adherence  [facility]  Interventions o Comprehensive medication review performed. o Continue current medication management strategy  Patient self care activities - Over the next 90 days,  patient will: o Focus on medication adherence by filling and taking medications appropriately  o Take medications as prescribed o Report any questions or concerns to PharmD and/or provider(s)  Initial goal documentation       Social Hx:  Widowed. Lost her husband 05/2019 due to [place].  3 Living children and 1 deceased child. 6 grandchildren. Lives alone. No pets.    Hypertension   BP goal is:  <140/90  Office blood pressures are  BP Readings from Last 3 Encounters:  12/23/19 (!) 118/51  12/18/19 (!) 161/62  12/16/19 124/64   Patient checks BP at home weekly Patient home BP readings are ranging: 137/87  Patient has failed these meds in the past: triamterene-hctz (listed in D/C meds. No apparent reason for D/C) Patient is currently controlled on the following medications:   Metoprolol Succinate 80m daily HS  Amlodipine 536mdaily AM  Losartan 10050maily  States she has "ice pick" headaches that last less than 1 minute. She was seen by neuro, but was told there wasn't much to be done.  Denies chest pain since she had her stents placed.  Denies dizziness. We discussed BP goal  Plan -Consider checking  BP 2-3 times per week and record -Continue current medications   Hyperlipidemia/CAD   LDL goal <70  Lipid Panel     Component Value Date/Time   CHOL 146 10/13/2019 0909   TRIG 99.0 10/13/2019 0909   HDL 56.90 10/13/2019 0909   LDLCALC 70 10/13/2019 0909   LDLDIRECT 73.4 05/20/2014 1651    Hepatic Function Latest Ref Rng & Units 12/18/2019 12/15/2019 10/13/2019  Total Protein 6.5 - 8.1 g/dL 6.2(L) 6.4 6.0  Albumin 3.5 - 5.0 g/dL 4.0 4.3 4.0  AST 15 - 41 U/L '20 19 15  ' ALT 0 - 44 U/L '20 19 12  ' Alk Phosphatase 38 - 126 U/L 75 84 83  Total Bilirubin 0.3 - 1.2 mg/dL 0.9 0.5 0.9  Bilirubin, Direct 0.0 - 0.3 mg/dL - - -     The ASCVD Risk score (GoMikey Bussing Jr., et al., 2013) failed to calculate for the following reasons:   The 2013 ASCVD risk score is only valid for ages 40 90 79 70Patient has failed these meds in past: None noted  Patient is currently borderline controlled on the following medications:   Simvastatin 50m21mily at bedtime  Aspirin 81mg13m discussed:  LDL goal  Plan -Continue current medications  Diabetes   A1c goal 7.5%  Recent Relevant Labs: Lab Results  Component Value Date/Time   HGBA1C 7.2 (H) 10/13/2019 09:09 AM   HGBA1C 7.0 (H) 05/30/2018 09:45 AM   GFR 54.17 (L) 12/15/2019 03:36 PM   GFR 52.95 (L) 10/13/2019 09:09 AM   MICROALBUR <0.7 05/30/2018 09:45 AM    Last diabetic Eye exam:  Lab Results  Component Value Date/Time   HMDIABEYEEXA No Retinopathy 05/14/2019 12:00 AM    Last diabetic Foot exam: No results found for: HMDIABFOOTEX   Checking BG: Never  Patient has failed these meds in past: metformin? Patient is currently controlled on the following medications:  None  Diet B - 2 scrambled eggs with cheese, bacon bits, crossiant; cereal with fruit L - Usually skips (pb crackers) Dinner - Salad, leftover chicken breast Drinks - coffee w/ cream and splenda  Exercise She has an exercise bike on her porch that she uses occasionally  We  discussed: diet and exercise extensively  Plan -Continue current medications  GERD  Patient has failed these meds in past: None noted  Patient is currently controlled on the following medications:   Omeprazole 31m daily  Breakthrough Sx: about twice per week Breakthrough Tx: famotidine or tums Triggers: Fried foods, seafood  Had hernia surgery  Plan -Avoid triggers -Continue current medications   Restless Leg Syndrome    Patient has failed these meds in past: None noted  Patient is currently controlled on the following medications:  Pramipexole 0.280mtwice daily  Will take #2 tabs a night time some nights (about twice a week at most)  Plan -Continue current medications   Can't lie on her right side due to hip pain  Pain above calf on R leg. Same leg as her previous DVT.  Not red or warm, but reports it wasn't red or warm when she was initially diagnosed. Denies SOB. Advised if her pain increases she should report to ED. She is hesitant due to concern for COVID. Says she has seen Dr. CoBurt Knackithin the last 6 months and was informed she is at increased risk of another DVT   Miscellaneous Meds Magnesium 40064minc 89m38myoscyamine 0.125mg16manesDe BlanchrmD Clinical Pharmacist [phone]

## 2020-03-16 ENCOUNTER — Other Ambulatory Visit: Payer: Self-pay | Admitting: *Deleted

## 2020-03-16 DIAGNOSIS — E785 Hyperlipidemia, unspecified: Secondary | ICD-10-CM

## 2020-03-16 DIAGNOSIS — I10 Essential (primary) hypertension: Secondary | ICD-10-CM

## 2020-03-16 DIAGNOSIS — E1121 Type 2 diabetes mellitus with diabetic nephropathy: Secondary | ICD-10-CM

## 2020-03-16 DIAGNOSIS — E1169 Type 2 diabetes mellitus with other specified complication: Secondary | ICD-10-CM

## 2020-03-21 ENCOUNTER — Telehealth: Payer: Self-pay | Admitting: *Deleted

## 2020-03-21 DIAGNOSIS — N3941 Urge incontinence: Secondary | ICD-10-CM | POA: Diagnosis not present

## 2020-03-21 DIAGNOSIS — R102 Pelvic and perineal pain: Secondary | ICD-10-CM | POA: Diagnosis not present

## 2020-03-21 NOTE — Telephone Encounter (Signed)
   [facility] Pre-operative Risk Assessment    HEARTCARE STAFF: - Please ensure there is not already an duplicate clearance open for this procedure. - Under Visit Info/Reason for Call, type in Other and utilize the format Clearance MM/DD/YY or Clearance TBD. Do not use dashes or single digits. - If request is for dental extraction, please clarify the # of teeth to be extracted.  Request for surgical clearance:  1. What type of surgery is being performed? A&P REPAIR   2. When is this surgery scheduled? TBD   3. What type of clearance is required (medical clearance vs. Pharmacy clearance to hold med vs. Both)?  BOTH  4. Are there any medications that need to be held prior to surgery and how long?  ASPIRIN     5. Practice name and name of physician performing surgery? [facility]; DR. Caryl Asp BOVARD     6. What is your office phone number [phone] EXT [phone]    7.   What is your office fax number [phone] ATTN: HANNA  8.   Anesthesia type (None, local, MAC, general) ?    Jeanann Lewandowsky 03/21/2020, 12:06 PM  _________________________________________________________________   (provider comments below)  '

## 2020-03-22 NOTE — Telephone Encounter (Signed)
   Primary Cardiologist: [name], MD  Chart reviewed as part of pre-operative protocol coverage. Patient was contacted 03/22/2020 in reference to pre-operative risk assessment for pending surgery as outlined below.  CHEYNE BUNGERT was last seen on 10/20/2019 by [name] PA-C.  Since that day, SARAHMARIE LEAVEY has done well without chest pain or shortness of breath.  Therefore, based on ACC/AHA guidelines, the patient would be at acceptable risk for the planned procedure without further cardiovascular testing.   The patient was advised that if she develops new symptoms prior to surgery to contact our office to arrange for a follow-up visit, and she verbalized understanding.  I will route this recommendation to the requesting party via Epic fax function and remove from pre-op pool. Please call with questions.  As mentioned in the previous cardiac clearance, "Ideally, we recommend she remain on aspirin without interruption.  However, as her last PCI was over 14 years ago, if the bleeding risk is too great she may hold her aspirin 5-7 days prior to her procedure and resume it postoperatively as soon as possible."  Almyra Deforest, [place] 03/22/2020, 5:25 PM

## 2020-03-23 ENCOUNTER — Other Ambulatory Visit: Payer: Self-pay | Admitting: Family Medicine

## 2020-04-06 ENCOUNTER — Encounter (HOSPITAL_BASED_OUTPATIENT_CLINIC_OR_DEPARTMENT_OTHER): Payer: Self-pay | Admitting: Obstetrics and Gynecology

## 2020-04-06 DIAGNOSIS — N816 Rectocele: Secondary | ICD-10-CM | POA: Diagnosis present

## 2020-04-07 NOTE — Patient Instructions (Addendum)
Visit Information  Goals Addressed            This Visit's Progress   . Chronic Care Management Pharmacy Care Plan       CARE PLAN ENTRY (see longitudinal plan of care for additional care plan information)  Current Barriers:  . Chronic Disease Management support, education, and care coordination needs related to Hypertension, Hyperlipidemia/CAD, Diabetes, GERD, RLS   Hypertension BP Readings from Last 3 Encounters:  12/23/19 (!) 118/51  12/18/19 (!) 161/62  12/16/19 124/64   . Pharmacist Clinical Goal(s): o Over the next 90 days, patient will work with PharmD and providers to maintain BP goal <140/90 . Current regimen:  . Metoprolol Succinate 70m daily HS . Amlodipine 540mdaily AM . Losartan 10052maily . Interventions: o Requested patient to check blood pressure 2-3 times per week and record o Discussed BP goal . Patient self care activities - Over the next 90 days, patient will: o Check BP 2-3 times per week, document, and provide at future appointments o Ensure daily salt intake < 2300 mg/Pricella Gaugh  Hyperlipidemia/CAD Lab Results  Component Value Date/Time   LDLCALC 70 10/13/2019 09:09 AM   LDLDIRECT 73.4 05/20/2014 04:51 PM   . Pharmacist Clinical Goal(s): o Over the next 90 days, patient will work with PharmD and providers to maintain LDL goal < 70 . Current regimen:  . Simvastatin 50m69mily at bedtime . Aspirin 81mg78mnterventions: o Discussed LDL goal . Patient self care activities - Over the next 90 days, patient will: o Maintain cholesterol medication regimen.   Diabetes Lab Results  Component Value Date/Time   HGBA1C 7.2 (H) 10/13/2019 09:09 AM   HGBA1C 7.0 (H) 05/30/2018 09:45 AM   . Pharmacist Clinical Goal(s): o Over the next 90 days, patient will work with PharmD and providers to maintain A1c goal  <7.5% . Current regimen:  o Diet and exercise management   . Interventions: o Discussed diet and exercise . Patient self care activities - Over the  next 90 days, patient will: o Maintain a1c goal <7.5%  Medication management . Pharmacist Clinical Goal(s): o Over the next 90 days, patient will work with PharmD and providers to maintain optimal medication adherence . [facility] o Comprehensive medication review performed. o Continue current medication management strategy . Patient self care activities - Over the next 90 days, patient will: o Focus on medication adherence by filling and taking medications appropriately  o Take medications as prescribed o Report any questions or concerns to PharmD and/or provider(s)  Initial goal documentation        Ms. HadleNielsongiven information about Chronic Care Management services today including:  1. CCM service includes personalized support from designated clinical staff supervised by her physician, including individualized plan of care and coordination with other care providers 2. 24/7 contact phone numbers for assistance for urgent and routine care needs. 3. Standard insurance, coinsurance, copays and deductibles apply for chronic care management only during months in which we provide at least 20 minutes of these services. Most insurances cover these services at 100%, however patients may be responsible for any copay, coinsurance and/or deductible if applicable. This service may help you avoid the need for more expensive face-to-face services. 4. Only one practitioner may furnish and bill the service in a calendar month. 5. The patient may stop CCM services at any time (effective at the end of the month) by phone call to the office staff.  Patient  agreed to services and verbal consent obtained.   The patient verbalized understanding of instructions provided today and agreed to receive a mailed copy of patient instruction and/or educational materials. Telephone follow up appointment with pharmacy team member scheduled for: 09/06/2020  Melvenia Beam Mitchell Epling,  PharmD Clinical Pharmacist [facility] at [facility] [phone]  Healthy Eating Following a healthy eating pattern may help you to achieve and maintain a healthy body weight, reduce the risk of chronic disease, and live a long and productive life. It is important to follow a healthy eating pattern at an appropriate calorie level for your body. Your nutritional needs should be met primarily through food by choosing a variety of nutrient-rich foods. What are tips for following this plan? Reading food labels  Read labels and choose the following: ? Reduced or low sodium. ? Juices with 100% fruit juice. ? Foods with low saturated fats and high polyunsaturated and monounsaturated fats. ? Foods with whole grains, such as whole wheat, cracked wheat, brown rice, and wild rice. ? Whole grains that are fortified with folic acid. This is recommended for women who are pregnant or who want to become pregnant.  Read labels and avoid the following: ? Foods with a lot of added sugars. These include foods that contain brown sugar, corn sweetener, corn syrup, dextrose, fructose, glucose, high-fructose corn syrup, honey, invert sugar, lactose, malt syrup, maltose, molasses, raw sugar, sucrose, trehalose, or turbinado sugar.  Do not eat more than the following amounts of added sugar per Hanson Medeiros:  6 teaspoons (25 g) for women.  9 teaspoons (38 g) for men. ? Foods that contain processed or refined starches and grains. ? Refined grain products, such as white flour, degermed cornmeal, white bread, and white rice. Shopping  Choose nutrient-rich snacks, such as vegetables, whole fruits, and nuts. Avoid high-calorie and high-sugar snacks, such as potato chips, fruit snacks, and candy.  Use oil-based dressings and spreads on foods instead of solid fats such as butter, stick margarine, or cream cheese.  Limit pre-made sauces, mixes, and "instant" products such as flavored rice, instant noodles,  and ready-made pasta.  Try more plant-protein sources, such as tofu, tempeh, black beans, edamame, lentils, nuts, and seeds.  Explore eating plans such as the Mediterranean diet or vegetarian diet. Cooking  Use oil to saut or stir-fry foods instead of solid fats such as butter, stick margarine, or lard.  Try baking, boiling, grilling, or broiling instead of frying.  Remove the fatty part of meats before cooking.  Steam vegetables in water or broth. Meal planning   At meals, imagine dividing your plate into fourths: ? One-half of your plate is fruits and vegetables. ? One-fourth of your plate is whole grains. ? One-fourth of your plate is protein, especially lean meats, poultry, eggs, tofu, beans, or nuts.  Include low-fat dairy as part of your daily diet. Lifestyle  Choose healthy options in all settings, including home, work, school, restaurants, or stores.  Prepare your food safely: ? Wash your hands after handling raw meats. ? Keep food preparation surfaces clean by regularly washing with hot, soapy water. ? Keep raw meats separate from ready-to-eat foods, such as fruits and vegetables. ? Cook seafood, meat, poultry, and eggs to the recommended internal temperature. ? Store foods at safe temperatures. In general:  Keep cold foods at 54F (4.4C) or below.  Keep hot foods at 154F (60C) or above.  Keep your freezer at [facility] (-17.8C) or below.  Foods are no longer safe to  eat when they have been between the temperatures of 40-140F (4.4-60C) for more than 2 hours. What foods should I eat? Fruits Aim to eat 2 cup-equivalents of fresh, canned (in natural juice), or frozen fruits each Ayoub Arey. Examples of 1 cup-equivalent of fruit include 1 small apple, 8 large strawberries, 1 cup canned fruit,  cup dried fruit, or 1 cup 100% juice. Vegetables Aim to eat 2-3 cup-equivalents of fresh and frozen vegetables each Keena Dinse, including different varieties and colors. Examples of 1  cup-equivalent of vegetables include 2 medium carrots, 2 cups raw, leafy greens, 1 cup chopped vegetable (raw or cooked), or 1 medium baked potato. Grains Aim to eat 6 ounce-equivalents of whole grains each Elynore Dolinski. Examples of 1 ounce-equivalent of grains include 1 slice of bread, 1 cup ready-to-eat cereal, 3 cups popcorn, or  cup cooked rice, pasta, or cereal. Meats and other proteins Aim to eat 5-6 ounce-equivalents of protein each Harlym Gehling. Examples of 1 ounce-equivalent of protein include 1 egg, 1/2 cup nuts or seeds, or 1 tablespoon (16 g) peanut butter. A cut of meat or fish that is the size of a deck of cards is about 3-4 ounce-equivalents.  Of the protein you eat each week, try to have at least 8 ounces come from seafood. This includes salmon, trout, herring, and anchovies. Dairy Aim to eat 3 cup-equivalents of fat-free or low-fat dairy each Alder Murri. Examples of 1 cup-equivalent of dairy include 1 cup (240 mL) milk, 8 ounces (250 g) yogurt, 1 ounces (44 g) natural cheese, or 1 cup (240 mL) fortified soy milk. Fats and oils  Aim for about 5 teaspoons (21 g) per Jaree Dwight. Choose monounsaturated fats, such as canola and olive oils, avocados, peanut butter, and most nuts, or polyunsaturated fats, such as sunflower, corn, and soybean oils, walnuts, pine nuts, sesame seeds, sunflower seeds, and flaxseed. Beverages  Aim for six 8-oz glasses of water per Reed Dady. Limit coffee to three to five 8-oz cups per Arena Lindahl.  Limit caffeinated beverages that have added calories, such as soda and energy drinks.  Limit alcohol intake to no more than 1 drink a Elecia Serafin for nonpregnant women and 2 drinks a Ariany Kesselman for men. One drink equals 12 oz of beer (355 mL), 5 oz of wine (148 mL), or 1 oz of hard liquor (44 mL). Seasoning and other foods  Avoid adding excess amounts of salt to your foods. Try flavoring foods with herbs and spices instead of salt.  Avoid adding sugar to foods.  Try using oil-based dressings, sauces, and spreads  instead of solid fats. This information is based on general [place] nutrition guidelines. For more information, visit [employer].es. Exact amounts may vary based on your nutrition needs. Summary  A healthy eating plan may help you to maintain a healthy weight, reduce the risk of chronic diseases, and stay active throughout your life.  Plan your meals. Make sure you eat the right portions of a variety of nutrient-rich foods.  Try baking, boiling, grilling, or broiling instead of frying.  Choose healthy options in all settings, including home, work, school, restaurants, or stores. This information is not intended to replace advice given to you by your health care provider. Make sure you discuss any questions you have with your health care provider. Document Revised: 10/07/2017 Document Reviewed: 10/07/2017 Elsevier Patient Education  [place].

## 2020-04-13 DIAGNOSIS — Z01818 Encounter for other preprocedural examination: Secondary | ICD-10-CM | POA: Diagnosis not present

## 2020-04-13 DIAGNOSIS — Z01812 Encounter for preprocedural laboratory examination: Secondary | ICD-10-CM | POA: Diagnosis not present

## 2020-04-15 ENCOUNTER — Other Ambulatory Visit: Payer: Self-pay

## 2020-04-15 ENCOUNTER — Encounter (HOSPITAL_BASED_OUTPATIENT_CLINIC_OR_DEPARTMENT_OTHER): Payer: Self-pay | Admitting: Obstetrics and Gynecology

## 2020-04-15 NOTE — Progress Notes (Signed)
Spoke w/ via phone for pre-op interview---pt Lab needs dos----none               Lab results------has lab appt 04-15-2020 1000 am for cbc cmet COVID test ------04-18-2020 1100 am Arrive at -------630am 04-20-2020 NPO after MN NO Solid Food.  Clear liquids from MN until---530 am then npo Medications to take morning of surgery -----isosorbide mononitrate, mirapex, amlodipine, omeprazole Diabetic medication -----n/a Patient Special Instructions -----none Pre-Op special Istructions -----none Patient verbalized understanding of instructions that were given at this phone interview. Patient denies shortness of breath, chest pain, fever, cough at this phone interview.  Anesthesia Review: no  PCP: stacey blythe Cardiologist : dr cooper cardiac clearance hao meng pa 03-22-2020 epic lov [facility] we[facility] pa 10-20-2019 Chest x-ray :none EKG : 10-20-2019 Echo :10-24-2011 Stress test:08-03-2009 Cardiac Cath : 2006 Activity level: climbs stairs without problems does housework Sleep Study/ CPAP :none Fasting Blood Sugar :      / Checks Blood Sugar -- times a day:  n/a Blood Thinner/ Instructions /Last Dose:n/a ASA / Instructions/ Last Dose :  Staying on 81 mg aspirin per dr bovard last dose will be 04-19-2020

## 2020-04-16 ENCOUNTER — Other Ambulatory Visit (HOSPITAL_COMMUNITY): Payer: Medicare HMO

## 2020-04-18 ENCOUNTER — Other Ambulatory Visit (HOSPITAL_COMMUNITY)
Admission: RE | Admit: 2020-04-18 | Discharge: 2020-04-18 | Disposition: A | Discharge status: Home / Self Care | Payer: Medicare HMO | Source: Ambulatory Visit | Attending: Obstetrics and Gynecology | Admitting: Obstetrics and Gynecology

## 2020-04-18 ENCOUNTER — Encounter (HOSPITAL_COMMUNITY)
Admission: RE | Admit: 2020-04-18 | Discharge: 2020-04-18 | Disposition: A | Discharge status: Home / Self Care | Payer: Medicare HMO | Source: Ambulatory Visit | Attending: Obstetrics and Gynecology | Admitting: Obstetrics and Gynecology

## 2020-04-18 ENCOUNTER — Other Ambulatory Visit: Payer: Self-pay

## 2020-04-18 DIAGNOSIS — Z20822 Contact with and (suspected) exposure to covid-19: Secondary | ICD-10-CM | POA: Insufficient documentation

## 2020-04-18 DIAGNOSIS — Z01812 Encounter for preprocedural laboratory examination: Secondary | ICD-10-CM | POA: Insufficient documentation

## 2020-04-18 LAB — CBC
HCT: 43.8 % (ref 36.0–46.0)
Hemoglobin: 14.2 g/dL (ref 12.0–15.0)
MCH: 30.4 pg (ref 26.0–34.0)
MCHC: 32.4 g/dL (ref 30.0–36.0)
MCV: 93.8 fL (ref 80.0–100.0)
Platelets: 180 10*3/uL (ref 150–400)
RBC: 4.67 MIL/uL (ref 3.87–5.11)
RDW: 12.5 % (ref 11.5–15.5)
WBC: 5.4 10*3/uL (ref 4.0–10.5)
nRBC: 0 % (ref 0.0–0.2)

## 2020-04-18 LAB — COMPREHENSIVE METABOLIC PANEL
ALT: 18 U/L (ref 0–44)
AST: 24 U/L (ref 15–41)
Albumin: 4.1 g/dL (ref 3.5–5.0)
Alkaline Phosphatase: 74 U/L (ref 38–126)
Anion gap: 9 (ref 5–15)
BUN: 18 mg/dL (ref 8–23)
CO2: 25 mmol/L (ref 22–32)
Calcium: 9.2 mg/dL (ref 8.9–10.3)
Chloride: 104 mmol/L (ref 98–111)
Creatinine, Ser: 1 mg/dL (ref 0.44–1.00)
GFR, Estimated: 52 mL/min — ABNORMAL LOW (ref >60)
Glucose, Bld: 176 mg/dL — ABNORMAL HIGH (ref 70–99)
Potassium: 3.8 mmol/L (ref 3.5–5.1)
Sodium: 138 mmol/L (ref 135–145)
Total Bilirubin: 1 mg/dL (ref 0.3–1.2)
Total Protein: 6.6 g/dL (ref 6.5–8.1)

## 2020-04-18 LAB — SARS CORONAVIRUS 2 (TAT 6-24 HRS): SARS Coronavirus 2: NEGATIVE

## 2020-04-19 ENCOUNTER — Ambulatory Visit: Payer: Medicare HMO | Admitting: Family Medicine

## 2020-04-19 NOTE — H&P (Signed)
Heather [name] is an 85 y.o. Heather [name] with posterior vaginal bulge.  Was treated with anterior colporrhaphy earlier this year - had no posterior bulge/weakness.  Since surgery patient abruptly noticed bulge and uncomfortable pressure.  On exam anterior vagina and vaginal cuff well-supporte. 4+/4 posterior prolapse.  Inability in past to tolerate pessary - desires definitive management.  Clearance obtained from PCP.  D/w pt r/b/a and process for surgery.    Pertinent Gynecological History:  OB History: G4, P3103 40wk SVD x 3. 28 wk SVD  passed Remote abn pap, nl since No STDs Declines MMG Hysterectomy for prolapse.    No PMB, no vasomotor sx's  Menstrual History:  No LMP recorded. Patient has had a hysterectomy.    Past Medical History:  Diagnosis Date  . Cancer [facility])    cervical cancer after hysterectomy  . Coronary artery disease cardiologist--- dr Burt Knack   hx NSTEMI  w/ cardiac cath 03-16-2005 PCI with DES to LCx;   cardiac cath 06-27-2005  PCI with DES to RCA with residual dx LAD manage medcially;  myoview/ lexiscan 01-26-20211 normal no ischemia, ef 70%;  stress echo w/ dobutamine 10-24-2011 negative ishcmeia , normal ef  . Diabetes mellitus type 2, diet-controlled ([place])    followed by pcp  diet controlled does not check cbg  . GERD (gastroesophageal reflux disease)   . Hiatal hernia    recurrence,  hx HH repair 1989  . History of cervical cancer    areas burned off   . History of DVT of lower extremity 2016   11-29-2014 post op right TKA of right lower extremity and completed xarelto   . History of esophageal stricture    hx s/p dilatation's  . History of gastric ulcer 2005 approx.  Marland Kitchen History of palpitations 2010   event monitor 07-07-2009 showed NSR w/ freq. SVT ectopies with short runs, rare PVCs  . History of TIA (transient ischemic attack) 06/1999   12-15-2019  per pt had several TIA between 12/ 2000 to 02/ 2001 , was sent to specialist @[facility] , had test that was  normal and pt stated no TIAs since but has residual of essential tremors of right arm/ hand  . Hypertension   . IT band syndrome   . Migraine    "ice pick headche lasts about 30 seconds"  . Mixed hyperlipidemia   . Multiple thyroid nodules    followed by pcp---   ultrasound 11-22-2014 no bx   (12-15-2019 per pt had a endocrinologist and was told did not need bx)  . OA (osteoarthritis)    knees, elbow, hip, ankles  . Occasional tremors    right arm/ hand  s/p TIA residual 2000  . Osteoporosis    taking vitamin d  . Pelvic relaxation   . 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 Right bundle branch block (RBBB) with left anterior fascicular block (LAFB)   . RLS (restless legs syndrome)   . S/P drug eluting coronary stent placement 2006   03-16-2005  PCI x1 DES to LCx;   06-27-2005  PCI x1 DES to RCA  . SUI (stress urinary incontinence, female)     Past Surgical History:  Procedure Laterality Date  . ANTERIOR AND POSTERIOR REPAIR N/A 12/22/2019   Procedure: ANTERIOR (CYSTOCELE)  REPAIR;  Surgeon: Janyth Contes, MD;  Location: [place];  Service: Gynecology;  Laterality: N/A;  . CATARACT EXTRACTION W/ INTRAOCULAR LENS  IMPLANT, BILATERAL  2015  . COLONOSCOPY  last one ?  Marland Kitchen  CORONARY ANGIOPLASTY WITH STENT PLACEMENT  03-16-2005   dr Lia Foyer   PCI and DES x1 to LCx  . CORONARY ANGIOPLASTY WITH STENT PLACEMENT  06-27-2005  dr Lia Foyer   PCI and DES x1 to RCA with residual disease LAD 70-80% to manage medically  . FOOT SURGERY Left 1990s   left foot stress fracture repair, per pt no hardware  . HIATAL HERNIA REPAIR  1989  . KNEE ARTHROSCOPY Bilateral right ?/   left x2 , last one 09-12-2009 @ [place]  . TOTAL KNEE ARTHROPLASTY  11/12/2011   Procedure: TOTAL KNEE ARTHROPLASTY;  Surgeon: Lorn Junes, MD;  Location: [place];  Service: Orthopedics;  Laterality: Left;  Dr Noemi Chapel wants 90 minutes for this case  . TOTAL KNEE ARTHROPLASTY Right 11/29/2014   Procedure:  RIGHT TOTAL KNEE ARTHROPLASTY;  Surgeon: Vickey Huger, MD;  Location: [place];  Service: Orthopedics;  Laterality: Right;  . UPPER GASTROINTESTINAL ENDOSCOPY  last one 04-25-2017   with dilatation esophageal stricture and savary dilatation  . VAGINAL HYSTERECTOMY  1988    no ovaries removed for bleeeding    Family History  Problem Relation Age of Onset  . Stroke Father        family hx of M 1st degree relative <50  . Coronary artery disease Mother   . Heart disease Mother   . Depression Brother   . Stroke Brother   . Diabetes Brother   . Cancer Brother        bladder with mets  . Diabetes Daughter        borderline  . Hypertension Daughter   . Arthritis Other        family hx of  . Hypertension Other        family hx of  . Other Other        family hx of cardiovascular disorder  . Thyroid disease Daughter   . Breast cancer Neg Hx   . Colon cancer Neg Hx   . Anesthesia problems Neg Hx   . Hypotension Neg Hx   . Malignant hyperthermia Neg Hx   . Pseudochol deficiency Neg Hx   . Colon polyps Neg Hx   . Esophageal cancer Neg Hx   . Rectal cancer Neg Hx   . Stomach cancer Neg Hx     Social History:  reports that she has never smoked. She has never used smokeless tobacco. She reports that she does not drink alcohol and does not use drugs. widowed, retired  Allergies:  Allergies  Allergen Reactions  . Baclofen Other (See Comments)    Hyperactivity   . Nitrofurantoin Rash    KCL, amlodipine, ASA, famotidine, hycosamine, isosorbide, losrtan, toprol, omeprazole, odansetron, oxycodone, pramipexaloe, simvistatin,     Review of Systems  Constitutional: Negative.   HENT: Negative.   Eyes: Negative.   Respiratory: Negative.   Cardiovascular: Negative.   Gastrointestinal: Negative.   Genitourinary:       Pelvic pressure, bulge at vagina  Musculoskeletal: Negative.   Skin: Negative.   Neurological: Negative.   Psychiatric/Behavioral: Negative.     Height 5\' 3"  (1.6  m), weight 73.5 kg. Physical Exam Constitutional:      Appearance: Normal appearance.  HENT:     Head: Normocephalic and atraumatic.  Cardiovascular:     Rate and Rhythm: Normal rate and regular rhythm.  Pulmonary:     Effort: Pulmonary effort is normal.     Breath sounds: Normal breath sounds.  Abdominal:  General: Abdomen is flat. Bowel sounds are normal.     Palpations: Abdomen is soft.  Musculoskeletal:        General: Normal range of motion.     Cervical back: Normal range of motion and neck supple.  Skin:    General: Skin is warm and dry.  Neurological:     General: No focal deficit present.     Mental Status: She is alert and oriented to person, place, and time.  Psychiatric:        Mood and Affect: Mood normal.        Behavior: Behavior normal.     Ur C neg  Assessment/Plan: 85yo [id number] for posterior repair given symptomatic posterior relaxation D/w pt r/b/a of surgery, desires definitive management Monitor overnight after procedure  Aja Whitehair Bovard-Stuckert 04/19/2020, 8:30 PM

## 2020-04-20 ENCOUNTER — Encounter (HOSPITAL_BASED_OUTPATIENT_CLINIC_OR_DEPARTMENT_OTHER): Payer: Self-pay | Admitting: Obstetrics and Gynecology

## 2020-04-20 ENCOUNTER — Observation Stay (HOSPITAL_BASED_OUTPATIENT_CLINIC_OR_DEPARTMENT_OTHER)
Admission: RE | Admit: 2020-04-20 | Discharge: 2020-04-21 | Disposition: A | Discharge status: Home / Self Care | Payer: Medicare HMO | Attending: Obstetrics and Gynecology | Admitting: Obstetrics and Gynecology

## 2020-04-20 ENCOUNTER — Encounter (HOSPITAL_BASED_OUTPATIENT_CLINIC_OR_DEPARTMENT_OTHER)
Admission: RE | Disposition: A | Discharge status: Home / Self Care | Payer: Self-pay | Source: Home / Self Care | Attending: Obstetrics and Gynecology

## 2020-04-20 ENCOUNTER — Observation Stay (HOSPITAL_BASED_OUTPATIENT_CLINIC_OR_DEPARTMENT_OTHER): Payer: Medicare HMO | Admitting: Anesthesiology

## 2020-04-20 DIAGNOSIS — N816 Rectocele: Secondary | ICD-10-CM | POA: Diagnosis not present

## 2020-04-20 DIAGNOSIS — Z96653 Presence of artificial knee joint, bilateral: Secondary | ICD-10-CM | POA: Insufficient documentation

## 2020-04-20 DIAGNOSIS — I2510 Atherosclerotic heart disease of native coronary artery without angina pectoris: Secondary | ICD-10-CM | POA: Insufficient documentation

## 2020-04-20 DIAGNOSIS — N8184 Pelvic muscle wasting: Secondary | ICD-10-CM | POA: Diagnosis not present

## 2020-04-20 DIAGNOSIS — Z7901 Long term (current) use of anticoagulants: Secondary | ICD-10-CM | POA: Diagnosis not present

## 2020-04-20 DIAGNOSIS — E119 Type 2 diabetes mellitus without complications: Secondary | ICD-10-CM | POA: Insufficient documentation

## 2020-04-20 DIAGNOSIS — Z955 Presence of coronary angioplasty implant and graft: Secondary | ICD-10-CM | POA: Insufficient documentation

## 2020-04-20 DIAGNOSIS — I10 Essential (primary) hypertension: Secondary | ICD-10-CM | POA: Insufficient documentation

## 2020-04-20 DIAGNOSIS — Z86718 Personal history of other venous thrombosis and embolism: Secondary | ICD-10-CM | POA: Insufficient documentation

## 2020-04-20 DIAGNOSIS — N815 Vaginal enterocele: Secondary | ICD-10-CM | POA: Diagnosis not present

## 2020-04-20 DIAGNOSIS — Z8541 Personal history of malignant neoplasm of cervix uteri: Secondary | ICD-10-CM | POA: Diagnosis not present

## 2020-04-20 DIAGNOSIS — Z79899 Other long term (current) drug therapy: Secondary | ICD-10-CM | POA: Insufficient documentation

## 2020-04-20 DIAGNOSIS — K469 Unspecified abdominal hernia without obstruction or gangrene: Secondary | ICD-10-CM | POA: Diagnosis not present

## 2020-04-20 DIAGNOSIS — Z8673 Personal history of transient ischemic attack (TIA), and cerebral infarction without residual deficits: Secondary | ICD-10-CM | POA: Diagnosis not present

## 2020-04-20 HISTORY — DX: Migraine, unspecified, not intractable, without status migrainosus: G43.909

## 2020-04-20 HISTORY — PX: RECTOCELE REPAIR: SHX761

## 2020-04-20 LAB — GLUCOSE, CAPILLARY
Glucose-Capillary: 132 mg/dL — ABNORMAL HIGH (ref 70–99)
Glucose-Capillary: 119 mg/dL — ABNORMAL HIGH (ref 70–99)

## 2020-04-20 SURGERY — COLPORRHAPHY, POSTERIOR, FOR RECTOCELE REPAIR
Anesthesia: Spinal | Site: Vagina

## 2020-04-20 MED ORDER — KETOROLAC TROMETHAMINE 30 MG/ML IJ SOLN
30.0000 mg | Freq: Three times a day (TID) | INTRAMUSCULAR | Status: DC | PRN
  Administered 2020-04-20: 15 mg via INTRAVENOUS

## 2020-04-20 MED ORDER — FAMOTIDINE 20 MG PO TABS: 20.0000 mg | ORAL_TABLET | Freq: Every evening | ORAL | Status: DC | PRN

## 2020-04-20 MED ORDER — ROCURONIUM BROMIDE 10 MG/ML (PF) SYRINGE
PREFILLED_SYRINGE | INTRAVENOUS | Status: AC
  Filled 2020-04-20: qty 10

## 2020-04-20 MED ORDER — ACETAMINOPHEN 500 MG PO TABS
1000.0000 mg | ORAL_TABLET | ORAL | Status: AC
  Administered 2020-04-20: 1000 mg via ORAL

## 2020-04-20 MED ORDER — SIMVASTATIN 20 MG PO TABS
20.0000 mg | ORAL_TABLET | Freq: Every day | ORAL | Status: DC
  Administered 2020-04-20: 20 mg via ORAL
  Filled 2020-04-20: qty 1

## 2020-04-20 MED ORDER — ONDANSETRON HCL 4 MG PO TABS: 4.0000 mg | ORAL_TABLET | Freq: Four times a day (QID) | ORAL | Status: DC | PRN

## 2020-04-20 MED ORDER — FENTANYL CITRATE (PF) 100 MCG/2ML IJ SOLN
INTRAMUSCULAR | Status: AC
  Filled 2020-04-20: qty 2

## 2020-04-20 MED ORDER — PANTOPRAZOLE SODIUM 40 MG PO TBEC: 40.0000 mg | DELAYED_RELEASE_TABLET | Freq: Every day | ORAL | Status: DC

## 2020-04-20 MED ORDER — FENTANYL CITRATE (PF) 100 MCG/2ML IJ SOLN: 25.0000 ug | INTRAMUSCULAR | Status: DC | PRN

## 2020-04-20 MED ORDER — OXYCODONE HCL 5 MG/5ML PO SOLN: 5.0000 mg | Freq: Once | ORAL | Status: DC | PRN

## 2020-04-20 MED ORDER — PRAMIPEXOLE DIHYDROCHLORIDE 0.25 MG PO TABS
0.2500 mg | ORAL_TABLET | Freq: Two times a day (BID) | ORAL | Status: DC
  Administered 2020-04-20: 0.25 mg via ORAL
  Filled 2020-04-20: qty 1

## 2020-04-20 MED ORDER — PHENYLEPHRINE HCL (PRESSORS) 10 MG/ML IV SOLN
INTRAVENOUS | Status: AC
  Filled 2020-04-20: qty 2

## 2020-04-20 MED ORDER — ACETAMINOPHEN 500 MG PO TABS
1000.0000 mg | ORAL_TABLET | Freq: Four times a day (QID) | ORAL | Status: DC | PRN
  Administered 2020-04-20 – 2020-04-21 (×2): 1000 mg via ORAL

## 2020-04-20 MED ORDER — PROPOFOL 10 MG/ML IV BOLUS
INTRAVENOUS | Status: AC
  Filled 2020-04-20: qty 20

## 2020-04-20 MED ORDER — HYDROMORPHONE HCL 1 MG/ML IJ SOLN: 0.2000 mg | INTRAMUSCULAR | Status: DC | PRN

## 2020-04-20 MED ORDER — SIMETHICONE 80 MG PO CHEW: 80.0000 mg | CHEWABLE_TABLET | Freq: Four times a day (QID) | ORAL | Status: DC | PRN

## 2020-04-20 MED ORDER — ESTRADIOL 0.1 MG/GM VA CREA
TOPICAL_CREAM | VAGINAL | Status: DC | PRN
  Administered 2020-04-20: 1 via VAGINAL

## 2020-04-20 MED ORDER — POVIDONE-IODINE 10 % EX SWAB: 2.0000 "application " | Freq: Once | CUTANEOUS | Status: DC

## 2020-04-20 MED ORDER — LIDOCAINE 2% (20 MG/ML) 5 ML SYRINGE
INTRAMUSCULAR | Status: DC | PRN
  Administered 2020-04-20: 20 mg via INTRAVENOUS
  Administered 2020-04-20: 40 mg via INTRAVENOUS

## 2020-04-20 MED ORDER — AMLODIPINE BESYLATE 5 MG PO TABS
5.0000 mg | ORAL_TABLET | Freq: Every day | ORAL | Status: DC
  Filled 2020-04-20: qty 1

## 2020-04-20 MED ORDER — VITAMIN D 25 MCG (1000 UNIT) PO TABS
2000.0000 [IU] | ORAL_TABLET | Freq: Every day | ORAL | Status: DC
  Filled 2020-04-20: qty 2

## 2020-04-20 MED ORDER — TRAMADOL HCL 50 MG PO TABS
ORAL_TABLET | ORAL | Status: AC
  Filled 2020-04-20: qty 1

## 2020-04-20 MED ORDER — GUAIFENESIN 100 MG/5ML PO SOLN
15.0000 mL | ORAL | Status: DC | PRN
  Filled 2020-04-20: qty 15

## 2020-04-20 MED ORDER — ACETAMINOPHEN 500 MG PO TABS
ORAL_TABLET | ORAL | Status: AC
  Filled 2020-04-20: qty 2

## 2020-04-20 MED ORDER — BUPIVACAINE IN DEXTROSE 0.75-8.25 % IT SOLN
INTRATHECAL | Status: DC | PRN
  Administered 2020-04-20: 1.4 mL via INTRATHECAL

## 2020-04-20 MED ORDER — PROPOFOL 10 MG/ML IV BOLUS
INTRAVENOUS | Status: DC | PRN
  Administered 2020-04-20: 30 mg via INTRAVENOUS

## 2020-04-20 MED ORDER — KETOROLAC TROMETHAMINE 30 MG/ML IJ SOLN: 15.0000 mg | Freq: Three times a day (TID) | INTRAMUSCULAR | Status: DC | PRN

## 2020-04-20 MED ORDER — PROPOFOL 500 MG/50ML IV EMUL
INTRAVENOUS | Status: DC | PRN
  Administered 2020-04-20: 50 ug/kg/min via INTRAVENOUS

## 2020-04-20 MED ORDER — AMISULPRIDE (ANTIEMETIC) 5 MG/2ML IV SOLN: 10.0000 mg | Freq: Once | INTRAVENOUS | Status: DC | PRN

## 2020-04-20 MED ORDER — LACTATED RINGERS IV SOLN: INTRAVENOUS | Status: DC

## 2020-04-20 MED ORDER — CEFAZOLIN SODIUM-DEXTROSE 2-4 GM/100ML-% IV SOLN
INTRAVENOUS | Status: AC
  Filled 2020-04-20: qty 100

## 2020-04-20 MED ORDER — CEFAZOLIN SODIUM-DEXTROSE 2-4 GM/100ML-% IV SOLN
2.0000 g | INTRAVENOUS | Status: AC
  Administered 2020-04-20: 2 g via INTRAVENOUS

## 2020-04-20 MED ORDER — LIDOCAINE 2% (20 MG/ML) 5 ML SYRINGE
INTRAMUSCULAR | Status: AC
  Filled 2020-04-20: qty 5

## 2020-04-20 MED ORDER — POTASSIUM CHLORIDE CRYS ER 20 MEQ PO TBCR
20.0000 meq | EXTENDED_RELEASE_TABLET | Freq: Every day | ORAL | Status: DC
  Administered 2020-04-20: 20 meq via ORAL
  Filled 2020-04-20: qty 1

## 2020-04-20 MED ORDER — LOSARTAN POTASSIUM 50 MG PO TABS
100.0000 mg | ORAL_TABLET | Freq: Every day | ORAL | Status: DC
  Administered 2020-04-20: 100 mg via ORAL
  Filled 2020-04-20: qty 2

## 2020-04-20 MED ORDER — MENTHOL 3 MG MT LOZG: 1.0000 | LOZENGE | OROMUCOSAL | Status: DC | PRN

## 2020-04-20 MED ORDER — METOPROLOL SUCCINATE ER 50 MG PO TB24
50.0000 mg | ORAL_TABLET | Freq: Every day | ORAL | Status: DC
  Administered 2020-04-20: 50 mg via ORAL
  Filled 2020-04-20: qty 1

## 2020-04-20 MED ORDER — TRAMADOL HCL 50 MG PO TABS
50.0000 mg | ORAL_TABLET | Freq: Four times a day (QID) | ORAL | Status: DC | PRN
  Administered 2020-04-20 – 2020-04-21 (×2): 50 mg via ORAL

## 2020-04-20 MED ORDER — VASOPRESSIN 20 UNIT/ML IV SOLN
INTRAVENOUS | Status: DC | PRN
  Administered 2020-04-20: 20 mL via INTRAMUSCULAR

## 2020-04-20 MED ORDER — PROPOFOL 500 MG/50ML IV EMUL
INTRAVENOUS | Status: AC
  Filled 2020-04-20: qty 50

## 2020-04-20 MED ORDER — IBUPROFEN 800 MG PO TABS: 800.0000 mg | ORAL_TABLET | Freq: Three times a day (TID) | ORAL | Status: DC | PRN

## 2020-04-20 MED ORDER — VITAMIN C 250 MG PO TABS
ORAL_TABLET | Freq: Every day | ORAL | Status: DC
  Filled 2020-04-20: qty 1

## 2020-04-20 MED ORDER — ISOSORBIDE MONONITRATE ER 30 MG PO TB24
30.0000 mg | ORAL_TABLET | Freq: Every day | ORAL | Status: DC
  Filled 2020-04-20: qty 1

## 2020-04-20 MED ORDER — MAGNESIUM OXIDE 400 (241.3 MG) MG PO TABS
400.0000 mg | ORAL_TABLET | Freq: Two times a day (BID) | ORAL | Status: DC
  Administered 2020-04-20: 400 mg via ORAL
  Filled 2020-04-20 (×2): qty 1

## 2020-04-20 MED ORDER — PHENYLEPHRINE HCL-NACL 20-0.9 MG/250ML-% IV SOLN
INTRAVENOUS | Status: DC | PRN
  Administered 2020-04-20: 50 ug/min via INTRAVENOUS

## 2020-04-20 MED ORDER — OXYCODONE HCL 5 MG PO TABS: 5.0000 mg | ORAL_TABLET | Freq: Once | ORAL | Status: DC | PRN

## 2020-04-20 MED ORDER — ALUM & MAG HYDROXIDE-SIMETH 200-200-20 MG/5ML PO SUSP: 30.0000 mL | ORAL | Status: DC | PRN

## 2020-04-20 MED ORDER — ONDANSETRON HCL 4 MG/2ML IJ SOLN: 4.0000 mg | Freq: Four times a day (QID) | INTRAMUSCULAR | Status: DC | PRN

## 2020-04-20 MED ORDER — ASPIRIN EC 81 MG PO TBEC
81.0000 mg | DELAYED_RELEASE_TABLET | Freq: Every day | ORAL | Status: DC
  Filled 2020-04-20: qty 1

## 2020-04-20 MED ORDER — OXYCODONE-ACETAMINOPHEN 5-325 MG PO TABS: 1.0000 | ORAL_TABLET | ORAL | Status: DC | PRN

## 2020-04-20 MED ORDER — ONDANSETRON HCL 4 MG/2ML IJ SOLN: 4.0000 mg | Freq: Once | INTRAMUSCULAR | Status: DC | PRN

## 2020-04-20 MED ORDER — KETOROLAC TROMETHAMINE 30 MG/ML IJ SOLN
INTRAMUSCULAR | Status: AC
  Filled 2020-04-20: qty 1

## 2020-04-20 SURGICAL SUPPLY — 27 items
BLADE SURG 15 STRL LF DISP TIS (BLADE) ×1 IMPLANT
BLADE SURG 15 STRL SS (BLADE) ×2
CNTNR URN SCR LID CUP LEK RST (MISCELLANEOUS) ×1 IMPLANT
CONT SPEC 4OZ STRL OR WHT (MISCELLANEOUS) ×2
DECANTER SPIKE VIAL GLASS SM (MISCELLANEOUS) ×1 IMPLANT
GAUZE 4X4 16PLY RFD (DISPOSABLE) ×2 IMPLANT
GAUZE PACKING 1/2X5YD (GAUZE/BANDAGES/DRESSINGS) ×2 IMPLANT
GLOVE BIO SURGEON STRL SZ 6.5 (GLOVE) ×4 IMPLANT
GLOVE BIOGEL PI IND STRL 7.0 (GLOVE) IMPLANT
GLOVE BIOGEL PI INDICATOR 7.0 (GLOVE) ×3
GOWN STRL REUS W/TWL LRG LVL3 (GOWN DISPOSABLE) ×5 IMPLANT
KIT TURNOVER CYSTO (KITS) ×2 IMPLANT
NDL MAYO CATGUT SZ4 TPR NDL (NEEDLE) IMPLANT
NEEDLE HYPO 22GX1.5 SAFETY (NEEDLE) ×2 IMPLANT
NEEDLE MAYO CATGUT SZ4 (NEEDLE) IMPLANT
NS IRRIG 1000ML POUR BTL (IV SOLUTION) ×2 IMPLANT
PACK VAGINAL WOMENS (CUSTOM PROCEDURE TRAY) ×2 IMPLANT
SUT VIC AB 2-0 CT2 27 (SUTURE) ×5 IMPLANT
SUT VIC AB 3-0 CT1 27 (SUTURE)
SUT VIC AB 3-0 CT1 TAPERPNT 27 (SUTURE) IMPLANT
SUT VIC AB 3-0 SH 27 (SUTURE) ×4
SUT VIC AB 3-0 SH 27X BRD (SUTURE) ×2 IMPLANT
SUT VICRYL 0 UR6 27IN ABS (SUTURE) ×1 IMPLANT
TOWEL OR 17X26 10 PK STRL BLUE (TOWEL DISPOSABLE) ×2 IMPLANT
TRAY FOL W/BAG SLVR 16FR STRL (SET/KITS/TRAYS/PACK) IMPLANT
TRAY FOLEY W/BAG SLVR 14FR LF (SET/KITS/TRAYS/PACK) ×1 IMPLANT
TRAY FOLEY W/BAG SLVR 16FR LF (SET/KITS/TRAYS/PACK) ×2

## 2020-04-20 NOTE — Anesthesia Postprocedure Evaluation (Signed)
Anesthesia Post Note  Patient: Heather [name]  Procedure(s) Performed: POSTERIOR REPAIR (RECTOCELE) (N/A Vagina )     Patient location during evaluation: PACU Anesthesia Type: Spinal Level of consciousness: oriented and awake and alert Pain management: pain level controlled Vital Signs Assessment: post-procedure vital signs reviewed and stable Respiratory status: spontaneous breathing, respiratory function stable and nonlabored ventilation Cardiovascular status: blood pressure returned to baseline and stable Postop Assessment: no headache, no backache, no apparent nausea or vomiting and spinal receding Anesthetic complications: no   No complications documented.  Last Vitals:  Vitals:   04/20/20 1015 04/20/20 1030  BP: (!) 163/69   Pulse: (!) 52 (!) 51  Resp: 16 12  Temp:    SpO2: 95% 97%    Last Pain:  Vitals:   04/20/20 1004  TempSrc:   PainSc: 0-No pain                 Lidia Collum

## 2020-04-20 NOTE — Interval H&P Note (Signed)
History and Physical Interval Note:  04/20/2020 8:09 AM  [place]  has presented today for surgery, with the diagnosis of relaxation of pelvic floor.  The various methods of treatment have been discussed with the patient and family. After consideration of risks, benefits and other options for treatment, the patient has consented to  Procedure(s): POSTERIOR REPAIR (RECTOCELE) (N/A) as a surgical intervention.  The patient's history has been reviewed, patient examined, no change in status, stable for surgery.  I have reviewed the patient's chart and labs.  Questions were answered to the patient's satisfaction.     Ouida Abeyta Bovard-Stuckert

## 2020-04-20 NOTE — Interval H&P Note (Signed)
History and Physical Interval Note:  04/20/2020 8:09 AM  [place]  has presented today for surgery, with the diagnosis of relaxation of pelvic floor.  The various methods of treatment have been discussed with the patient and family. After consideration of risks, benefits and other options for treatment, the patient has consented to  Procedure(s): POSTERIOR REPAIR (RECTOCELE) (N/A) as a surgical intervention.  The patient's history has been reviewed, patient examined, no change in status, stable for surgery.  I have reviewed the patient's chart and labs.  Questions were answered to the patient's satisfaction.     Bayani Renteria Bovard-Stuckert

## 2020-04-20 NOTE — Op Note (Signed)
Heather [name], Heather [name] MEDICAL RECORD [id number] ACCOUNT [id number] DATE OF BIRTH:March 07, 1935 FACILITY: [facility] LOCATION: [facility] PHYSICIAN:Roma Bondar BOVARD-STUCKERT, MD  OPERATIVE REPORT  DATE OF PROCEDURE:  04/20/2020  PREOPERATIVE DIAGNOSES:  Relaxation of pelvic floor, rectocele.  POSTOPERATIVE DIAGNOSES:  Relaxation of pelvic floor, rectocele, and enterocele.  PROCEDURE:  Posterior colporrhaphy, reduction of enterocele.  SURGEON:  Thornell Sartorius, MD   ASSISTANT:  Paula Compton, MD   ANESTHESIA:  Spinal and IV sedation.  ESTIMATED BLOOD LOSS:  25 mL.  URINE OUTPUT AND INTRAVENOUS FLUIDS:  Per anesthesia.  COMPLICATIONS:  None.  PATHOLOGY:  None.  DESCRIPTION OF PROCEDURE:  After informed consent was reviewed with the patient including risks, benefits and alternatives of the procedure, she was transported to the OR where spinal anesthesia was placed and found to be adequate.  She was then placed  in the supine position in the [place] stirrups.  She was prepped and draped in the normal sterile fashion.  A Foley catheter was sterilely placed.  As her legs were placed in the stirrups after an appropriate timeout was performed, the 4/4 rectocele was  noted; was able to be easily reduced.  Vasopressin was placed to control blood loss.  Her posterior aspect of the lowest part of the rectocele was grasped with Allis clamps and the mucosa was entered.  It was separated in the midline to the level of her  vaginal cuff.  The mucosa was then dissected from the underlying endopelvic fascia.  During the process, it was noted that there was an enterocele that was separate from the small rectocele.  The enterocele sac was entered and the contents were reduced.   The sac was closed with a pursestring of 3-0 Vicryl and it was reinforced with a 2nd pursestring of 3-0 Vicryl.  The posterior repair of the endopelvic fascia was then reapproximated with 2-0 Vicryl in a running locked fashion.  The  mucosa was  reapproximated with 2-0 Vicryl in a running locked fashion as well.  The vagina was packed and the Foley was maintained.  She was awakened and, in stable condition, tolerated the procedure well.  CN/NUANCE  D:04/20/2020 T:04/20/2020 JOB:[id number]

## 2020-04-20 NOTE — Progress Notes (Signed)
Day of Surgery Procedure(s) (LRB): POSTERIOR REPAIR (RECTOCELE) (N/A)  Subjective: Patient reports tolerating PO.  Pain controlled.    Objective: I have reviewed patient's vital signs, intake and output and medications.  General: alert and no distress Resp: clear to auscultation bilaterally Cardio: regular rate and rhythm GI: soft, non-tender; bowel sounds normal; no masses,  no organomegaly Extremities: extremities normal, atraumatic, no cyanosis or edema  Assessment: s/p Procedure(s): POSTERIOR REPAIR (RECTOCELE) (N/A): stable and progressing well  Plan: Advance diet Encourage ambulation Will d/c foley and pack at 4am  Will d/c tomorrow am  LOS: 0 days    Heather [name] 04/20/2020, 5:57 PM

## 2020-04-20 NOTE — Brief Op Note (Signed)
04/20/2020  9:59 AM  PATIENT:  Heather [name]  85 y.o. female  PRE-OPERATIVE DIAGNOSIS:  relaxation of pelvic floor, rectocoele  POST-OPERATIVE DIAGNOSIS:  relaxation of pelvic floor, rectocoele and enterocoele  PROCEDURE:  Procedure(s): POSTERIOR REPAIR (RECTOCELE) (N/A) Reduction of enterocoele  SURGEON:  Surgeon(s) and Role:    * Bovard-Stuckert, Essa Wenk, MD - Primary    * Paula Compton, MD - Assisting  ANESTHESIA:   spinal and IV sedation  EBL:  25 mL uop and IVF peranesthesia  BLOOD ADMINISTERED:none  DRAINS: Urinary Catheter (Foley)   LOCAL MEDICATIONS USED:  OTHER vasopressin  SPECIMEN:  No Specimen  DISPOSITION OF SPECIMEN:  N/A  COUNTS:  YES  TOURNIQUET:  * No tourniquets in log *  DICTATION: .Other Dictation: Dictation Number [id number]  PLAN OF CARE: Admit for overnight observation  PATIENT DISPOSITION:  PACU - hemodynamically stable.   Delay start of Pharmacological VTE agent (>24hrs) due to surgical blood loss or risk of bleeding: not applicable

## 2020-04-20 NOTE — Anesthesia Procedure Notes (Signed)
Spinal  Patient location during procedure: OR Staffing Performed: anesthesiologist  Anesthesiologist: Ermelinda Eckert E, MD Preanesthetic Checklist Completed: patient identified, IV checked, risks and benefits discussed, surgical consent, monitors and equipment checked, pre-op evaluation and timeout performed Spinal Block Patient position: sitting Prep: DuraPrep and site prepped and draped Patient monitoring: continuous pulse ox, blood pressure and heart rate Approach: midline Location: L3-4 Injection technique: single-shot Needle Needle type: Pencan  Needle gauge: 24 G Needle length: 9 cm Additional Notes Functioning IV was confirmed and monitors were applied. Sterile prep and drape, including hand hygiene and sterile gloves were used. The patient was positioned and the spine was prepped. The skin was anesthetized with lidocaine.  Free flow of clear CSF was obtained prior to injecting local anesthetic into the CSF. The needle was carefully withdrawn. The patient tolerated the procedure well.      

## 2020-04-20 NOTE — Anesthesia Procedure Notes (Addendum)
Procedure Name: MAC Date/Time: 04/20/2020 8:30 AM Performed by: Suan Halter, CRNA Pre-anesthesia Checklist: Patient identified, Emergency Drugs available, Suction available, Patient being monitored and Timeout performed Patient Re-evaluated:Patient Re-evaluated prior to induction Oxygen Delivery Method: Simple face mask Induction Type: IV induction

## 2020-04-20 NOTE — Progress Notes (Signed)
Upon shift assessment no vaginal packing or string was found. Prior RN reported that a small amount of packing came out when patient attempted to have a bowel movement earlier in the shift. Both RN's on night shift assessed and no additional packing was noted. Will continue to monitor patient.

## 2020-04-20 NOTE — Anesthesia Preprocedure Evaluation (Addendum)
Anesthesia Evaluation  Patient identified by MRN, date of birth, ID band Patient awake    Reviewed: Allergy & Precautions, NPO status , Patient's Chart, lab work & pertinent test results, reviewed documented beta blocker date and time   History of Anesthesia Complications Negative for: history of anesthetic complications  Airway Mallampati: II  TM Distance: >3 FB Neck ROM: Full    Dental  (+) Teeth Intact   Pulmonary neg pulmonary ROS,    Pulmonary exam normal        Cardiovascular hypertension, Pt. on medications and Pt. on home beta blockers + CAD, + Cardiac Stents and + DVT  Normal cardiovascular exam  negative stress echo 2013   Neuro/Psych TIAnegative psych ROS   GI/Hepatic Neg liver ROS, hiatal hernia, PUD, GERD  ,H/o esophageal stricture s/p dilation   Endo/Other  diabetes, Type 2  Renal/GU negative Renal ROS  negative genitourinary   Musculoskeletal  (+) Arthritis ,   Abdominal   Peds  Hematology negative hematology ROS (+)   Anesthesia Other Findings   Reproductive/Obstetrics                          Anesthesia Physical Anesthesia Plan  ASA: III  Anesthesia Plan: Spinal   Post-op Pain Management:    Induction: Intravenous  PONV Risk Score and Plan: 2 and Treatment may vary due to age or medical condition, Propofol infusion and TIVA  Airway Management Planned: Nasal Cannula, Natural Airway and Simple Face Mask  Additional Equipment: None  Intra-op Plan:   Post-operative Plan: Extubation in OR  Informed Consent: I have reviewed the patients History and Physical, chart, labs and discussed the procedure including the risks, benefits and alternatives for the proposed anesthesia with the patient or authorized representative who has indicated his/her understanding and acceptance.     Dental advisory given  Plan Discussed with:   Anesthesia Plan Comments: (Pt reports  she has had a few episodes of left chest wall pain over the last 2 days. It is sharp, brief, and not associated with activity. No dyspnea. She states it feels like a pulled muscle. Reassured her that it does not sound cardiac in nature and should be safe to proceed, but she will be monitored closely intraoperatively.)     Anesthesia Quick Evaluation

## 2020-04-20 NOTE — Transfer of Care (Signed)
Immediate Anesthesia Transfer of Care Note  Patient: Heather [name]  Procedure(s) Performed: Procedure(s) (LRB): POSTERIOR REPAIR (RECTOCELE) (N/A)  Patient Location: PACU  Anesthesia Type: MAC  Level of Consciousness: awake, alert , oriented and patient cooperative  Airway & Oxygen Therapy: Patient Spontanous Breathing and Patient connected to face mask oxygen  Post-op Assessment: Report given to PACU RN and Post -op Vital signs reviewed and stable  Post vital signs: Reviewed and stable  Complications: No apparent anesthesia complications  Last Vitals:  Vitals Value Taken Time  BP    Temp    Pulse 57 04/20/20 1004  Resp 17 04/20/20 1004  SpO2 95 % 04/20/20 1004  Vitals shown include unvalidated device data.  Last Pain:  Vitals:   04/20/20 0707  TempSrc: Oral  PainSc: 0-No pain      Patients Stated Pain Goal: 5 (01/49/96 9249)  Complications: No complications documented.

## 2020-04-21 DIAGNOSIS — E119 Type 2 diabetes mellitus without complications: Secondary | ICD-10-CM | POA: Diagnosis not present

## 2020-04-21 DIAGNOSIS — I2510 Atherosclerotic heart disease of native coronary artery without angina pectoris: Secondary | ICD-10-CM | POA: Diagnosis not present

## 2020-04-21 DIAGNOSIS — I10 Essential (primary) hypertension: Secondary | ICD-10-CM | POA: Diagnosis not present

## 2020-04-21 DIAGNOSIS — K469 Unspecified abdominal hernia without obstruction or gangrene: Secondary | ICD-10-CM | POA: Diagnosis not present

## 2020-04-21 DIAGNOSIS — Z955 Presence of coronary angioplasty implant and graft: Secondary | ICD-10-CM | POA: Diagnosis not present

## 2020-04-21 DIAGNOSIS — Z86718 Personal history of other venous thrombosis and embolism: Secondary | ICD-10-CM | POA: Diagnosis not present

## 2020-04-21 DIAGNOSIS — Z8673 Personal history of transient ischemic attack (TIA), and cerebral infarction without residual deficits: Secondary | ICD-10-CM | POA: Diagnosis not present

## 2020-04-21 DIAGNOSIS — N816 Rectocele: Secondary | ICD-10-CM | POA: Diagnosis not present

## 2020-04-21 DIAGNOSIS — Z8541 Personal history of malignant neoplasm of cervix uteri: Secondary | ICD-10-CM | POA: Diagnosis not present

## 2020-04-21 LAB — BASIC METABOLIC PANEL
Anion gap: 8 (ref 5–15)
BUN: 17 mg/dL (ref 8–23)
CO2: 26 mmol/L (ref 22–32)
Calcium: 8.9 mg/dL (ref 8.9–10.3)
Chloride: 104 mmol/L (ref 98–111)
Creatinine, Ser: 0.8 mg/dL (ref 0.44–1.00)
GFR, Estimated: >60 mL/min (ref >60)
Glucose, Bld: 96 mg/dL (ref 70–99)
Potassium: 4.1 mmol/L (ref 3.5–5.1)
Sodium: 138 mmol/L (ref 135–145)

## 2020-04-21 LAB — CBC
HCT: 40.6 % (ref 36.0–46.0)
Hemoglobin: 13.2 g/dL (ref 12.0–15.0)
MCH: 30.8 pg (ref 26.0–34.0)
MCHC: 32.5 g/dL (ref 30.0–36.0)
MCV: 94.9 fL (ref 80.0–100.0)
Platelets: 144 10*3/uL — ABNORMAL LOW (ref 150–400)
RBC: 4.28 MIL/uL (ref 3.87–5.11)
RDW: 12.4 % (ref 11.5–15.5)
WBC: 8.8 10*3/uL (ref 4.0–10.5)
nRBC: 0 % (ref 0.0–0.2)

## 2020-04-21 MED ORDER — TRAMADOL HCL 50 MG PO TABS
ORAL_TABLET | ORAL | Status: AC
  Filled 2020-04-21: qty 1

## 2020-04-21 MED ORDER — ACETAMINOPHEN 500 MG PO TABS
ORAL_TABLET | ORAL | Status: AC
  Filled 2020-04-21: qty 2

## 2020-04-21 MED ORDER — TRAMADOL HCL 50 MG PO TABS: 50.0000 mg | ORAL_TABLET | Freq: Four times a day (QID) | ORAL | 1 refills | Status: DC | PRN

## 2020-04-21 NOTE — Progress Notes (Signed)
1 Day Post-Op Procedure(s) (LRB): POSTERIOR REPAIR (RECTOCELE) (N/A)  Subjective: Patient reports tolerating PO and no problems voiding.  Pain controlled.     Hgb stable.    Objective: I have reviewed patient's vital signs, intake and output, medications and labs.  General: alert, cooperative and no distress Resp: clear to auscultation bilaterally Cardio: regular rate and rhythm GI: soft, non-tender; bowel sounds normal; no masses,  no organomegaly Extremities: extremities normal, atraumatic, no cyanosis or edema  Assessment: s/p Procedure(s): POSTERIOR REPAIR (RECTOCELE) (N/A): stable and progressing well  Plan: Discharge home D/C with tramadol for pain.  F/u 2-4 weeks.     LOS: 0 days     Kaydon Creedon Bovard-Stuckert 04/21/2020, 7:22 AM

## 2020-04-21 NOTE — Discharge Summary (Signed)
Physician Discharge Summary  Patient ID: Heather [name] MRN: [id number] DOB/AGE: 1935-05-15 85 y.o.  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  D/C top home POD#1 walking, voidin, pain controlled  Consults: None  Significant Diagnostic Studies: labs: CBC, BMP  Treatments: surgery: posterior colporrhaphy and enteerocoele repair  Discharge Exam: Blood pressure (!) 158/48, pulse (!) 53, temperature 98.7 F (37.1 C), resp. rate 16, height 5' (1.524 m), weight 74.3 kg, SpO2 94 %. General appearance: alert, cooperative and no distress Resp: clear to auscultation bilaterally Cardio: regular rate and rhythm GI: soft, non-tender; bowel sounds normal; no masses,  no organomegaly Extremities: extremities normal, atraumatic, no cyanosis or edema  Disposition: Discharge disposition: 01-Home or Self Care       Discharge Instructions    Call MD for:  persistant nausea and vomiting   Complete by: As directed    Call MD for:  severe uncontrolled pain   Complete by: As directed    Call MD for:  temperature >100.4   Complete by: As directed    Diet - low sodium heart healthy   Complete by: As directed    Discharge instructions   Complete by: As directed    Call [phone] with questions or problems   Increase activity slowly   Complete by: As directed    May shower / Bathe   Complete by: As directed    May walk up steps   Complete by: As directed    No wound care   Complete by: As directed      Allergies as of 04/21/2020      Reactions   Baclofen Other (See Comments)   Hyperactivity   Nitrofurantoin Rash      Medication List    TAKE these medications   acetaminophen 500 MG tablet Commonly known as: TYLENOL Take 2 tablets (1,000 mg total) by  mouth every 6 (six) hours as needed for mild pain.   amLODipine 5 MG tablet Commonly known as: NORVASC TAKE 1 TABLET (5 MG TOTAL) BY MOUTH DAILY.   aspirin EC 81 MG tablet Take 1 tablet (81 mg total) by mouth daily.   famotidine 20 MG tablet Commonly known as: PEPCID Take 20 mg by mouth at bedtime as needed for heartburn or indigestion.   hyoscyamine 0.125 MG SL tablet Commonly known as: LEVSIN SL Place 1 tablet (0.125 mg total) under the tongue every 4 (four) hours as needed.   isosorbide mononitrate 60 MG 24 hr tablet Commonly known as: IMDUR TAKE 1/2 TABLET EVERY DAY   losartan 100 MG tablet Commonly known as: COZAAR Take 1 tablet (100 mg total) by mouth daily.   magnesium oxide 400 MG tablet Commonly known as: MAG-OX Take 400 mg by mouth 2 (two) times daily.   metoprolol succinate 50 MG 24 hr tablet Commonly known as: TOPROL-XL Take 1 tablet (50 mg total) by mouth daily. What changed: when to take this   omeprazole 20 MG capsule Commonly known as: PRILOSEC Take 1 capsule (20 mg total) by mouth daily as needed. What changed: when to take this   ondansetron 4 MG disintegrating tablet Commonly known as: Zofran ODT Take 1 tablet (4 mg total) by mouth every 6 (six) hours as needed for nausea or vomiting.   OVER THE COUNTER MEDICATION Zinc q day   potassium  chloride SA 20 MEQ tablet Commonly known as: KLOR-CON Take 1 tablet (20 mEq total) by mouth daily. What changed: when to take this   pramipexole 0.25 MG tablet Commonly known as: MIRAPEX TAKE 1 TABLET TWICE DAILY What changed: when to take this   simvastatin 20 MG tablet Commonly known as: ZOCOR TAKE 1 TABLET AT BEDTIME   traMADol 50 MG tablet Commonly known as: ULTRAM Take 1-2 tablets (50-100 mg total) by mouth every 6 (six) hours as needed for moderate pain.   Vitamin C 500 MG Chew Chew by mouth daily.   Vitamin D 50 MCG (2000 UT) Caps Take 2,000 Units by mouth daily.       Follow-up  Information    Bovard-Stuckert, Dimitriy Carreras, MD. Schedule an appointment as soon as possible for a visit in 3 day(s).   Specialty: Obstetrics and Gynecology Why: 2-4 weeks - post op check Contact information: [place] [address] [place] [address] [phone]               Signed: Janyth Contes 04/21/2020, 7:28 AM

## 2020-04-21 NOTE — Discharge Instructions (Signed)
Stay very well hydrated Don't get constipated!- use colace Anterior and Posterior Colporrhaphy and Sling Procedure, Care After This sheet gives you information about how to care for yourself after your procedure. Your health care provider may also give you more specific instructions. If you have problems or questions, contact your health care provider. What can I expect after the procedure? After the procedure, it is common to have:  Pain in the surgical area.  Vaginal discharge. You will need to use a sanitary pad during this time.  Fatigue. Follow these instructions at home: Incision care   Follow instructions from your health care provider about how to take care of your incision. Make sure you: ? Wash your hands with soap and water before touching the incision area. If soap and water are not available, use hand sanitizer. ? Clean your incision as told by your health care provider. ? Leave stitches (sutures), skin glue, or adhesive strips in place. These skin closures may need to stay in place for 2 weeks or longer. If adhesive strip edges start to loosen and curl up, you may trim the loose edges. Do not remove adhesive strips completely unless your health care provider tells you to do that.  Check your incision area every day for signs of infection. Check for: ? Redness, swelling, or pain. ? Fluid or blood. ? Warmth. ? Pus or a bad smell.  Check your incision every day to make sure the incision area is not separating or opening.  Do not take baths, swim, or use a hot tub until your health care provider approves. You may shower.  Keep the area between your vagina and rectum (perineal area) clean and dry. Make sure you clean the area after each bowel movement and each time you urinate.  Ask your health care provider if you can take a sitz bath or sit in a tub of clean, warm water. Activity  Do gentle, daily activity as told by your health care provider. You may be told to take  short walks every day and go farther each time. Ask your health care provider what activities are safe for you.  Limit stair climbing to once or twice a day in the first week, then slowly increase this activity.  Do not lift anything that is heavier than 10 lbs. (4.5 kg), or the limit that your health care provider tells you, until he or she says that it is safe. Avoid pushing or pulling motions.  Avoid standing for long periods of time.  Do not douche, use tampons, or have sex until your health care provider says it is okay.  Do not drive or use heavy machinery while taking prescription pain medicine. To prevent constipation  To prevent or treat constipation while you are taking prescription pain medicine, your health care provider may recommend that you: ? Take over-the-counter or prescription medicines. ? Eat foods that are high in fiber, such as fresh fruits and vegetables, whole grains, and beans. ? Drink enough fluid to keep your urine clear or pale yellow. ? Limit foods that are high in fat and processed sugars, such as fried and sweet foods. General instructions  You may be instructed to do pelvic floor exercises (kegels) as told by your health care provider.  Take over-the-counter and prescription medicines only as told by your health care provider.  Keep all follow-up visits as told by your health care provider. This is important. Contact a health care provider if:  Medicine does not help your  pain.  You have frequent or urgent urination, or you are unable to completely empty your bladder.  You feel a burning sensation when urinating.  You have fluid or blood coming from your incision.  You have pus or a bad smell coming from the incision.  Your incision feels warm to the touch.  You have redness, swelling, or pain around your incision. Get help right away if:  You have a fever or chills.  Your incision separates or opens.  You cannot urinate.  You have  trouble breathing. Summary  After the procedure, it is common to have pain, fatigue, and discharge from the vagina.  Keep the area between your vagina and rectum (perineal area) clean and dry. Make sure you clean the area after each bowel movement and each time you urinate.  Follow instructions from your health care provider on any activity restrictions after the procedure. This information is not intended to replace advice given to you by your health care provider. Make sure you discuss any questions you have with your health care provider. Document Revised: 06/07/2017 Document Reviewed: 06/25/2016 Elsevier Patient Education  2020 [employer].

## 2020-04-22 ENCOUNTER — Encounter (HOSPITAL_BASED_OUTPATIENT_CLINIC_OR_DEPARTMENT_OTHER): Payer: Self-pay | Admitting: Obstetrics and Gynecology

## 2020-05-04 ENCOUNTER — Other Ambulatory Visit: Payer: Self-pay | Admitting: Family Medicine

## 2020-05-04 DIAGNOSIS — G2581 Restless legs syndrome: Secondary | ICD-10-CM

## 2020-06-04 ENCOUNTER — Other Ambulatory Visit: Payer: Self-pay | Admitting: Family Medicine

## 2020-06-20 ENCOUNTER — Ambulatory Visit: Payer: Medicare HMO | Admitting: Family Medicine

## 2020-07-13 ENCOUNTER — Other Ambulatory Visit: Payer: Self-pay

## 2020-07-14 ENCOUNTER — Ambulatory Visit (INDEPENDENT_AMBULATORY_CARE_PROVIDER_SITE_OTHER): Payer: Medicare HMO | Admitting: Family Medicine

## 2020-07-14 ENCOUNTER — Other Ambulatory Visit: Payer: Self-pay

## 2020-07-14 ENCOUNTER — Other Ambulatory Visit: Payer: Self-pay | Admitting: Family Medicine

## 2020-07-14 ENCOUNTER — Ambulatory Visit (HOSPITAL_BASED_OUTPATIENT_CLINIC_OR_DEPARTMENT_OTHER)
Admission: RE | Admit: 2020-07-14 | Discharge: 2020-07-14 | Disposition: A | Discharge status: Home / Self Care | Payer: Medicare HMO | Source: Ambulatory Visit | Attending: Family Medicine | Admitting: Family Medicine

## 2020-07-14 VITALS — BP 132/66 | HR 68 | Temp 98.6°F | Resp 16 | Wt 163.8 lb

## 2020-07-14 DIAGNOSIS — M25551 Pain in right hip: Secondary | ICD-10-CM | POA: Diagnosis not present

## 2020-07-14 DIAGNOSIS — E1121 Type 2 diabetes mellitus with diabetic nephropathy: Secondary | ICD-10-CM | POA: Diagnosis not present

## 2020-07-14 DIAGNOSIS — M5441 Lumbago with sciatica, right side: Secondary | ICD-10-CM

## 2020-07-14 DIAGNOSIS — M79604 Pain in right leg: Secondary | ICD-10-CM

## 2020-07-14 DIAGNOSIS — D62 Acute posthemorrhagic anemia: Secondary | ICD-10-CM

## 2020-07-14 DIAGNOSIS — M545 Low back pain, unspecified: Secondary | ICD-10-CM | POA: Diagnosis not present

## 2020-07-14 DIAGNOSIS — E1169 Type 2 diabetes mellitus with other specified complication: Secondary | ICD-10-CM | POA: Diagnosis not present

## 2020-07-14 DIAGNOSIS — N644 Mastodynia: Secondary | ICD-10-CM

## 2020-07-14 DIAGNOSIS — M1611 Unilateral primary osteoarthritis, right hip: Secondary | ICD-10-CM | POA: Diagnosis not present

## 2020-07-14 DIAGNOSIS — I10 Essential (primary) hypertension: Secondary | ICD-10-CM

## 2020-07-14 DIAGNOSIS — M5416 Radiculopathy, lumbar region: Secondary | ICD-10-CM | POA: Insufficient documentation

## 2020-07-14 DIAGNOSIS — E785 Hyperlipidemia, unspecified: Secondary | ICD-10-CM

## 2020-07-14 DIAGNOSIS — N289 Disorder of kidney and ureter, unspecified: Secondary | ICD-10-CM | POA: Diagnosis not present

## 2020-07-14 LAB — COMPREHENSIVE METABOLIC PANEL
ALT: 15 U/L (ref 0–35)
AST: 16 U/L (ref 0–37)
Albumin: 4.3 g/dL (ref 3.5–5.2)
Alkaline Phosphatase: 89 U/L (ref 39–117)
BUN: 27 mg/dL — ABNORMAL HIGH (ref 6–23)
CO2: 30 mEq/L (ref 19–32)
Calcium: 9.4 mg/dL (ref 8.4–10.5)
Chloride: 104 mEq/L (ref 96–112)
Creatinine, Ser: 0.94 mg/dL (ref 0.40–1.20)
GFR: 56.01 mL/min — ABNORMAL LOW (ref >60.00)
Glucose, Bld: 126 mg/dL — ABNORMAL HIGH (ref 70–99)
Potassium: 4.5 mEq/L (ref 3.5–5.1)
Sodium: 140 mEq/L (ref 135–145)
Total Bilirubin: 0.5 mg/dL (ref 0.2–1.2)
Total Protein: 6.2 g/dL (ref 6.0–8.3)

## 2020-07-14 LAB — CBC
HCT: 41 % (ref 36.0–46.0)
Hemoglobin: 13.7 g/dL (ref 12.0–15.0)
MCHC: 33.4 g/dL (ref 30.0–36.0)
MCV: 90.5 fl (ref 78.0–100.0)
Platelets: 160 10*3/uL (ref 150.0–400.0)
RBC: 4.53 Mil/uL (ref 3.87–5.11)
RDW: 13.1 % (ref 11.5–15.5)
WBC: 6.2 10*3/uL (ref 4.0–10.5)

## 2020-07-14 LAB — LIPID PANEL
Cholesterol: 150 mg/dL (ref 0–200)
HDL: 62.1 mg/dL (ref >39.00)
LDL Cholesterol: 65 mg/dL (ref 0–99)
NonHDL: 88.01
Total CHOL/HDL Ratio: 2
Triglycerides: 117 mg/dL (ref 0.0–149.0)
VLDL: 23.4 mg/dL (ref 0.0–40.0)

## 2020-07-14 LAB — HEMOGLOBIN A1C: Hgb A1c MFr Bld: 7.1 % — ABNORMAL HIGH (ref 4.6–6.5)

## 2020-07-14 LAB — TSH: TSH: 2.07 u[IU]/mL (ref 0.35–4.50)

## 2020-07-14 MED ORDER — ONETOUCH VERIO VI STRP: ORAL_STRIP | 5 refills | Status: DC

## 2020-07-14 MED ORDER — ONETOUCH VERIO FLEX SYSTEM W/DEVICE KIT: PACK | 5 refills | Status: DC

## 2020-07-14 MED ORDER — ONETOUCH DELICA LANCETS 33G MISC: 5 refills | Status: DC

## 2020-07-14 NOTE — Patient Instructions (Signed)

## 2020-07-14 NOTE — Assessment & Plan Note (Signed)
Well controlled, no changes to meds. Encouraged heart healthy diet such as the DASH diet and exercise as tolerated.  °

## 2020-07-14 NOTE — Assessment & Plan Note (Signed)
Hydrate and monitor 

## 2020-07-14 NOTE — Assessment & Plan Note (Signed)
hgba1c acceptable, minimize simple carbs. Increase exercise as tolerated. Continue current meds 

## 2020-07-14 NOTE — Assessment & Plan Note (Signed)
Tolerating statin, encouraged heart healthy diet, avoid trans fats, minimize simple carbs and saturated fats. Increase exercise as tolerated 

## 2020-07-16 ENCOUNTER — Encounter (HOSPITAL_BASED_OUTPATIENT_CLINIC_OR_DEPARTMENT_OTHER): Payer: Self-pay

## 2020-07-16 ENCOUNTER — Ambulatory Visit (HOSPITAL_BASED_OUTPATIENT_CLINIC_OR_DEPARTMENT_OTHER)
Admission: RE | Admit: 2020-07-16 | Discharge: 2020-07-16 | Disposition: A | Discharge status: Home / Self Care | Payer: Medicare HMO | Source: Ambulatory Visit | Attending: Family Medicine | Admitting: Family Medicine

## 2020-07-16 ENCOUNTER — Other Ambulatory Visit: Payer: Self-pay

## 2020-07-16 DIAGNOSIS — M1611 Unilateral primary osteoarthritis, right hip: Secondary | ICD-10-CM | POA: Insufficient documentation

## 2020-07-16 DIAGNOSIS — M5441 Lumbago with sciatica, right side: Secondary | ICD-10-CM | POA: Diagnosis not present

## 2020-07-16 DIAGNOSIS — M545 Low back pain, unspecified: Secondary | ICD-10-CM | POA: Diagnosis not present

## 2020-07-16 DIAGNOSIS — S76311A Strain of muscle, fascia and tendon of the posterior muscle group at thigh level, right thigh, initial encounter: Secondary | ICD-10-CM | POA: Diagnosis not present

## 2020-07-16 DIAGNOSIS — S73191A Other sprain of right hip, initial encounter: Secondary | ICD-10-CM | POA: Diagnosis not present

## 2020-07-16 DIAGNOSIS — G8929 Other chronic pain: Secondary | ICD-10-CM | POA: Diagnosis not present

## 2020-07-16 MED ORDER — GADOBUTROL 1 MMOL/ML IV SOLN
7.5000 mL | Freq: Once | INTRAVENOUS | Status: AC | PRN
  Administered 2020-07-16: 7.5 mL via INTRAVENOUS

## 2020-07-17 ENCOUNTER — Other Ambulatory Visit: Payer: Self-pay | Admitting: Family Medicine

## 2020-07-17 DIAGNOSIS — M48061 Spinal stenosis, lumbar region without neurogenic claudication: Secondary | ICD-10-CM

## 2020-07-17 DIAGNOSIS — M25551 Pain in right hip: Secondary | ICD-10-CM

## 2020-07-17 NOTE — Assessment & Plan Note (Signed)
Worsening and with notable radiculopathy including weakness, numbness and pain, proceed with xrays and then MRI.

## 2020-07-17 NOTE — Assessment & Plan Note (Signed)
Encouraged heart healthy diet, increase exercise, avoid trans fats, consider a krill oil cap daily 

## 2020-07-17 NOTE — Progress Notes (Signed)
Subjective:    Patient ID: Heather [name], female    DOB: [birth year], 86 y.o.   MRN: [id number]  Chief Complaint  Patient presents with  . Follow-up    Pt states that she having issues with her lower back. Pt states her back feels like it's compressing.    HPI Patient is in today for follow up on chronic medical concerns. No recent febrile illness or hospitalizations. No complaints of polyuria or polydipsia. No recent fall or trauma. She is noting increasing low back pain and radicular symptoms of numbness, pain and weakness into both legs but right worse than left. Symptoms vary. Denies CP/palp/SOB/HA/congestion/fevers/GI or GU c/o. Taking meds as prescribed  Past Medical History:  Diagnosis Date  . Cancer [facility])    cervical cancer after hysterectomy  . Coronary artery disease cardiologist--- dr Burt Knack   hx NSTEMI  w/ cardiac cath 03-16-2005 PCI with DES to LCx;   cardiac cath 06-27-2005  PCI with DES to RCA with residual dx LAD manage medcially;  myoview/ lexiscan 01-26-20211 normal no ischemia, ef 70%;  stress echo w/ dobutamine 10-24-2011 negative ishcmeia , normal ef  . Diabetes mellitus type 2, diet-controlled ([place])    followed by pcp  diet controlled does not check cbg  . GERD (gastroesophageal reflux disease)   . Hiatal hernia    recurrence,  hx HH repair 1989  . History of cervical cancer    areas burned off   . History of DVT of lower extremity 2016   11-29-2014 post op right TKA of right lower extremity and completed xarelto   . History of esophageal stricture    hx s/p dilatation's  . History of gastric ulcer 2005 approx.  Marland Kitchen History of palpitations 2010   event monitor 07-07-2009 showed NSR w/ freq. SVT ectopies with short runs, rare PVCs  . History of TIA (transient ischemic attack) 06/1999   12-15-2019  per pt had several TIA between 12/ 2000 to 02/ 2001 , was sent to specialist [email or web address] , had test that was normal and pt stated no TIAs since but has residual of essential  tremors of right arm/ hand  . Hypertension   . IT band syndrome   . Migraine    "ice pick headche lasts about 30 seconds"  . Mixed hyperlipidemia   . Multiple thyroid nodules    followed by pcp---   ultrasound 11-22-2014 no bx   (12-15-2019 per pt had a endocrinologist and was told did not need bx)  . OA (osteoarthritis)    knees, elbow, hip, ankles  . Occasional tremors    right arm/ hand  s/p TIA residual 2000  . Osteoporosis    taking vitamin d  . Pelvic relaxation   . 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 Right bundle branch block (RBBB) with left anterior fascicular block (LAFB)   . RLS (restless legs syndrome)   . S/P drug eluting coronary stent placement 2006   03-16-2005  PCI x1 DES to LCx;   06-27-2005  PCI x1 DES to RCA  . SUI (stress urinary incontinence, female)     Past Surgical History:  Procedure Laterality Date  . ANTERIOR AND POSTERIOR REPAIR N/A 12/22/2019   Procedure: ANTERIOR (CYSTOCELE)  REPAIR;  Surgeon: Janyth Contes, MD;  Location: [facility];  Service: Gynecology;  Laterality: N/A;  . CATARACT EXTRACTION W/ INTRAOCULAR LENS  IMPLANT, BILATERAL  2015  . COLONOSCOPY  last one ?  . CORONARY ANGIOPLASTY WITH STENT PLACEMENT  03-16-2005   dr Lia Foyer   PCI and DES x1 to LCx  . CORONARY ANGIOPLASTY WITH STENT PLACEMENT  06-27-2005  dr Lia Foyer   PCI and DES x1 to RCA with residual disease LAD 70-80% to manage medically  . FOOT SURGERY Left 1990s   left foot stress fracture repair, per pt no hardware  . HIATAL HERNIA REPAIR  1989  . KNEE ARTHROSCOPY Bilateral right ?/   left x2 , last one 09-12-2009 @ [place]  . RECTOCELE REPAIR N/A 04/20/2020   Procedure: POSTERIOR REPAIR (RECTOCELE);  Surgeon: Janyth Contes, MD;  Location: [facility];  Service: Gynecology;  Laterality: N/A;  . TOTAL KNEE ARTHROPLASTY  11/12/2011   Procedure: TOTAL KNEE ARTHROPLASTY;  Surgeon: Lorn Junes, MD;  Location: [facility];  Service:  Orthopedics;  Laterality: Left;  Dr Noemi Chapel wants 90 minutes for this case  . TOTAL KNEE ARTHROPLASTY Right 11/29/2014   Procedure: RIGHT TOTAL KNEE ARTHROPLASTY;  Surgeon: Vickey Huger, MD;  Location: [facility];  Service: Orthopedics;  Laterality: Right;  . UPPER GASTROINTESTINAL ENDOSCOPY  last one 04-25-2017   with dilatation esophageal stricture and savary dilatation  . VAGINAL HYSTERECTOMY  1988    no ovaries removed for bleeeding    Family History  Problem Relation Age of Onset  . Stroke Father        family hx of M 1st degree relative <50  . Coronary artery disease Mother   . Heart disease Mother   . Depression Brother   . Stroke Brother   . Diabetes Brother   . Cancer Brother        bladder with mets  . Diabetes Daughter        borderline  . Hypertension Daughter   . Arthritis Other        family hx of  . Hypertension Other        family hx of  . Other Other        family hx of cardiovascular disorder  . Thyroid disease Daughter   . Breast cancer Neg Hx   . Colon cancer Neg Hx   . Anesthesia problems Neg Hx   . Hypotension Neg Hx   . Malignant hyperthermia Neg Hx   . Pseudochol deficiency Neg Hx   . Colon polyps Neg Hx   . Esophageal cancer Neg Hx   . Rectal cancer Neg Hx   . Stomach cancer Neg Hx     Social History   Socioeconomic History  . Marital status: Widowed    Spouse name: Not on file  . Number of children: 3  . Years of education: 36  . Highest education level: Not on file  Occupational History  . 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 Smoking status: Never Smoker  . Smokeless tobacco: Never Used  Vaping Use  . Vaping Use: Never used  Substance and Sexual Activity  . Alcohol use: No    Alcohol/week: 0.0 standard drinks  . Drug use: Never  . Sexual activity: Yes    Birth control/protection: Surgical    Comment: lives alone  Other Topics Concern  . Not on file  Social History Narrative    Lives with husband, Caffeine use- Half Caffeine)- 2 cups daily.  3 children living, one passed away.   Education: HS. Business college.  Retired.    Social Determinants of Health   Financial Resource Strain: Low Risk   .  Difficulty of Paying Living Expenses: Not hard at all  Food Insecurity: No Food Insecurity  . Worried About Charity fundraiser in the Last Year: Never true  . Ran Out of Food in the Last Year: Never true  Transportation Needs: No Transportation Needs  . Lack of Transportation (Medical): No  . Lack of Transportation (Non-Medical): No  Physical Activity: Inactive  . Days of Exercise per Week: 0 days  . Minutes of Exercise per Session: 0 min  Stress: Not on file  Social Connections: Not on file  Intimate Partner Violence: Not on file    Outpatient Medications Prior to Visit  Medication Sig Dispense Refill  . acetaminophen (TYLENOL) 500 MG tablet Take 2 tablets (1,000 mg total) by mouth every 6 (six) hours as needed for mild pain. 30 tablet 0  . amLODipine (NORVASC) 5 MG tablet TAKE 1 TABLET (5 MG TOTAL) BY MOUTH DAILY. 90 tablet 1  . Ascorbic Acid (VITAMIN C) 500 MG CHEW Chew by mouth daily.    Marland Kitchen aspirin EC 81 MG tablet Take 1 tablet (81 mg total) by mouth daily.    . Cholecalciferol (VITAMIN D) 50 MCG (2000 UT) CAPS Take 2,000 Units by mouth daily.     . famotidine (PEPCID) 20 MG tablet Take 20 mg by mouth at bedtime as needed for heartburn or indigestion.    . hyoscyamine (LEVSIN SL) 0.125 MG SL tablet Place 1 tablet (0.125 mg total) under the tongue every 4 (four) hours as needed. 30 tablet 0  . isosorbide mononitrate (IMDUR) 60 MG 24 hr tablet TAKE 1/2 TABLET EVERY DAY (Patient taking differently: Take 30 mg by mouth daily.) 45 tablet 3  . losartan (COZAAR) 100 MG tablet TAKE 1 TABLET EVERY DAY 90 tablet 1  . magnesium oxide (MAG-OX) 400 MG tablet Take 400 mg by mouth 2 (two) times daily.    . metoprolol succinate (TOPROL-XL) 50 MG 24 hr tablet Take 1 tablet (50  mg total) by mouth daily. (Patient taking differently: Take 50 mg by mouth at bedtime.) 90 tablet 2  . omeprazole (PRILOSEC) 20 MG capsule Take 1 capsule (20 mg total) by mouth daily as needed. (Patient taking differently: Take 20 mg by mouth daily.) 90 capsule 3  . ondansetron (ZOFRAN ODT) 4 MG disintegrating tablet Take 1 tablet (4 mg total) by mouth every 6 (six) hours as needed for nausea or vomiting. 30 tablet 0  . OVER THE COUNTER MEDICATION Zinc q day    . potassium chloride SA (KLOR-CON) 20 MEQ tablet Take 1 tablet (20 mEq total) by mouth daily. (Patient taking differently: Take 20 mEq by mouth at bedtime.) 90 tablet 2  . pramipexole (MIRAPEX) 0.25 MG tablet TAKE 1 TABLET TWICE DAILY 180 tablet 1  . simvastatin (ZOCOR) 20 MG tablet TAKE 1 TABLET AT BEDTIME (Patient taking differently: Take 20 mg by mouth at bedtime.) 90 tablet 3  . traMADol (ULTRAM) 50 MG tablet Take 1-2 tablets (50-100 mg total) by mouth every 6 (six) hours as needed for moderate pain. 30 tablet 1   No facility-administered medications prior to visit.    Allergies  Allergen Reactions  . Baclofen Other (See Comments)    Hyperactivity   . Nitrofurantoin Rash    Review of Systems  Constitutional: Positive for malaise/fatigue. Negative for fever.  HENT: Negative for congestion.   Eyes: Negative for blurred vision.  Respiratory: Negative for shortness of breath.   Cardiovascular: Negative for chest pain, palpitations and leg swelling.  Gastrointestinal:  Negative for abdominal pain, blood in stool and nausea.  Genitourinary: Negative for dysuria and frequency.  Musculoskeletal: Positive for back pain and joint pain. Negative for falls.  Skin: Negative for rash.  Neurological: Positive for sensory change and focal weakness. Negative for dizziness, loss of consciousness and headaches.  Endo/Heme/Allergies: Negative for environmental allergies.  Psychiatric/Behavioral: Negative for depression. The patient is not  nervous/anxious.        Objective:    Physical Exam Vitals and nursing note reviewed.  Constitutional:      General: She is not in acute distress.    Appearance: She is well-developed and well-nourished.  HENT:     Head: Normocephalic and atraumatic.     Nose: Nose normal.  Eyes:     General:        Right eye: No discharge.        Left eye: No discharge.  Cardiovascular:     Rate and Rhythm: Normal rate and regular rhythm.     Heart sounds: No murmur heard.   Pulmonary:     Effort: Pulmonary effort is normal.     Breath sounds: Normal breath sounds.  Abdominal:     General: Bowel sounds are normal.     Palpations: Abdomen is soft.     Tenderness: There is no abdominal tenderness.  Musculoskeletal:        General: No edema.     Cervical back: Normal range of motion and neck supple.  Skin:    General: Skin is warm and dry.  Neurological:     Mental Status: She is alert and oriented to person, place, and time.  Psychiatric:        Mood and Affect: Mood and affect normal.     BP 132/66   Pulse 68   Temp 98.6 F (37 C) (Oral)   Resp 16   Wt 163 lb 12.8 oz (74.3 kg)   SpO2 96%   BMI 31.99 kg/m  Wt Readings from Last 3 Encounters:  07/14/20 163 lb 12.8 oz (74.3 kg)  04/20/20 163 lb 14.4 oz (74.3 kg)  04/18/20 163 lb 4 oz (74 kg)    Diabetic Foot Exam - Simple   Simple Foot Form Visual Inspection No deformities, no ulcerations, no other skin breakdown bilaterally: Yes Sensation Testing Intact to touch and monofilament testing bilaterally: Yes Pulse Check Posterior Tibialis and Dorsalis pulse intact bilaterally: Yes Comments    Lab Results  Component Value Date   WBC 6.2 07/14/2020   HGB 13.7 07/14/2020   HCT 41.0 07/14/2020   PLT 160.0 07/14/2020   GLUCOSE 126 (H) 07/14/2020   CHOL 150 07/14/2020   TRIG 117.0 07/14/2020   HDL 62.10 07/14/2020   LDLDIRECT 73.4 05/20/2014   LDLCALC 65 07/14/2020   ALT 15 07/14/2020   AST 16 07/14/2020   NA 140  07/14/2020   K 4.5 07/14/2020   CL 104 07/14/2020   CREATININE 0.94 07/14/2020   BUN 27 (H) 07/14/2020   CO2 30 07/14/2020   TSH 2.07 07/14/2020   INR 0.98 11/16/2014   HGBA1C 7.1 (H) 07/14/2020   MICROALBUR <0.7 05/30/2018    Lab Results  Component Value Date   TSH 2.07 07/14/2020   Lab Results  Component Value Date   WBC 6.2 07/14/2020   HGB 13.7 07/14/2020   HCT 41.0 07/14/2020   MCV 90.5 07/14/2020   PLT 160.0 07/14/2020   Lab Results  Component Value Date   NA 140 07/14/2020   K  4.5 07/14/2020   CO2 30 07/14/2020   GLUCOSE 126 (H) 07/14/2020   BUN 27 (H) 07/14/2020   CREATININE 0.94 07/14/2020   BILITOT 0.5 07/14/2020   ALKPHOS 89 07/14/2020   AST 16 07/14/2020   ALT 15 07/14/2020   PROT 6.2 07/14/2020   ALBUMIN 4.3 07/14/2020   CALCIUM 9.4 07/14/2020   ANIONGAP 8 04/21/2020   GFR 56.01 (L) 07/14/2020   Lab Results  Component Value Date   CHOL 150 07/14/2020   Lab Results  Component Value Date   HDL 62.10 07/14/2020   Lab Results  Component Value Date   LDLCALC 65 07/14/2020   Lab Results  Component Value Date   TRIG 117.0 07/14/2020   Lab Results  Component Value Date   CHOLHDL 2 07/14/2020   Lab Results  Component Value Date   HGBA1C 7.1 (H) 07/14/2020       Assessment & Plan:   Problem List Items Addressed This Visit    Essential hypertension    Well controlled, no changes to meds. Encouraged heart healthy diet such as the DASH diet and exercise as tolerated.       Relevant Orders   CBC (Completed)   Comprehensive metabolic panel (Completed)   Lipid panel (Completed)   TSH (Completed)   Renal insufficiency    Hydrate and monitor      DM (diabetes mellitus) ([facility])    hgba1c acceptable, minimize simple carbs. Increase exercise as tolerated. Continue current meds      Relevant Orders   Hemoglobin A1c (Completed)   Postoperative anemia due to acute blood loss - Primary   Hyperlipidemia associated with type 2 diabetes  mellitus (H[facility])    Encouraged heart healthy diet, increase exercise, avoid trans fats, consider a krill oil cap daily      Relevant Orders   Lipid panel (Completed)   Pain of breast    Tolerating statin, encouraged heart healthy diet, avoid trans fats, minimize simple carbs and saturated fats. Increase exercise as tolerated      Right hip pain    No fall or trauma recently but pain and dysfunction are worsening, proceed with xrays and MRI       Low back pain radiating to right leg    Worsening and with notable radiculopathy including weakness, numbness and pain, proceed with xrays and then MRI.       Relevant Orders   DG Lumbar Spine 2-3 Views (Completed)   DG Hip Unilat W OR W/O Pelvis 2-3 Views Right (Completed)      I am having Geovana M. Evans start on [place], and OneTouch Delica Lancets 93X. I am also having her maintain her magnesium oxide, Vitamin D, aspirin EC, hyoscyamine, isosorbide mononitrate, simvastatin, Vitamin C, ondansetron, acetaminophen, potassium chloride SA, metoprolol succinate, omeprazole, amLODipine, famotidine, OVER THE COUNTER MEDICATION, traMADol, pramipexole, and losartan.  Meds ordered this encounter  Medications  . Blood Glucose Monitoring Suppl (ONETOUCH VERIO FLEX SYSTEM) w/Device KIT    Sig: Once daily or as needed    Dispense:  1 kit    Refill:  5  . glucose blood (ONETOUCH VERIO) test strip    Sig: Use as instructed    Dispense:  100 each    Refill:  5  . OneTouch Delica Lancets 90W MISC    Sig: Use to test blood sugar level once daily or as needed    Dispense:  100 each    Refill:  5  [name], MD

## 2020-07-17 NOTE — Assessment & Plan Note (Signed)
No fall or trauma recently but pain and dysfunction are worsening, proceed with xrays and MRI

## 2020-07-22 DIAGNOSIS — M25551 Pain in right hip: Secondary | ICD-10-CM | POA: Diagnosis not present

## 2020-07-27 ENCOUNTER — Telehealth: Payer: Self-pay

## 2020-07-27 DIAGNOSIS — M1611 Unilateral primary osteoarthritis, right hip: Secondary | ICD-10-CM | POA: Diagnosis not present

## 2020-07-27 MED ORDER — TRUE METRIX AIR GLUCOSE METER W/DEVICE KIT: PACK | 0 refills | Status: AC

## 2020-07-27 MED ORDER — TRUEPLUS LANCETS 33G MISC: 1 refills | Status: AC

## 2020-07-27 MED ORDER — TRUE METRIX BLOOD GLUCOSE TEST VI STRP: ORAL_STRIP | 1 refills | Status: DC

## 2020-07-27 NOTE — Telephone Encounter (Signed)
While on the phone with patient I noticed that she uses [employer] so I advised that I will send in the brand that they usually use and they will mail out to her.

## 2020-07-27 NOTE — Telephone Encounter (Signed)
Received PA for [employer] brand diabetic supplies. Recommend she call her insurance directly to see what brand they do cover.

## 2020-07-28 ENCOUNTER — Ambulatory Visit: Payer: Self-pay | Admitting: *Deleted

## 2020-08-01 DIAGNOSIS — M7631 Iliotibial band syndrome, right leg: Secondary | ICD-10-CM | POA: Diagnosis not present

## 2020-08-01 DIAGNOSIS — M545 Low back pain, unspecified: Secondary | ICD-10-CM | POA: Diagnosis not present

## 2020-08-01 DIAGNOSIS — M6281 Muscle weakness (generalized): Secondary | ICD-10-CM | POA: Diagnosis not present

## 2020-08-04 ENCOUNTER — Ambulatory Visit: Payer: Self-pay

## 2020-08-08 DIAGNOSIS — H524 Presbyopia: Secondary | ICD-10-CM | POA: Diagnosis not present

## 2020-08-08 DIAGNOSIS — R7309 Other abnormal glucose: Secondary | ICD-10-CM | POA: Diagnosis not present

## 2020-08-08 DIAGNOSIS — H52223 Regular astigmatism, bilateral: Secondary | ICD-10-CM | POA: Diagnosis not present

## 2020-08-08 DIAGNOSIS — Z961 Presence of intraocular lens: Secondary | ICD-10-CM | POA: Diagnosis not present

## 2020-08-08 DIAGNOSIS — H43813 Vitreous degeneration, bilateral: Secondary | ICD-10-CM | POA: Diagnosis not present

## 2020-08-08 DIAGNOSIS — H5203 Hypermetropia, bilateral: Secondary | ICD-10-CM | POA: Diagnosis not present

## 2020-08-08 LAB — HM DIABETES EYE EXAM

## 2020-08-09 DIAGNOSIS — Z0100 Encounter for examination of eyes and vision without abnormal findings: Secondary | ICD-10-CM | POA: Diagnosis not present

## 2020-08-16 ENCOUNTER — Telehealth: Payer: Self-pay | Admitting: Cardiovascular Disease

## 2020-08-16 NOTE — Telephone Encounter (Signed)
The patient reports her BP and HR have been elevated for about a week.  She has been experiencing intermittent palpitations, shakiness, and DOE.  Her symptoms occur mostly when exerting herself and resolve with rest.  Sometimes, it feels like her heart is racing "in her throat."  When she does rest, she is easily falling asleep during the day. She is unsure if this is because she hasn't really slept well at night due to leg cramping or if a cardiac issue. She has been checking her BP in the morning, prior to medications.  BP readings over the past several days have been ~150s/90s.  Her HR usually is between 59 and 62 but has been running in the 70s. She currently feels fine and is asymptomatic.  Scheduled the patient for evaluation with [name] this Friday.  Instructed her to check BP only 2 hours after taking morning medications.  She understands to call prior to visit if symptoms worsen. She was grateful for call and agrees with plan.

## 2020-08-16 NOTE — Telephone Encounter (Signed)
Pt c/o BP issue: STAT if pt c/o blurred vision, one-sided weakness or slurred speech  1. What are your last 5 BP readings?  02/08: 161/84 02/07: 162/106 02/06: 146/87  161/98 02/05: 152/99   2. Are you having any other symptoms (ex. Dizziness, headache, blurred vision, passed out)? Palpitations, shakes, weakness, lightheadedness  3. What is your BP issue?    Patient c/o Palpitations:  High priority if patient c/o lightheadedness, shortness of breath, or chest pain  1) How long have you had palpitations/irregular HR/ Afib? Are you having the symptoms now?  Patient states she has had palpitations for the past week, but she is not currently having palpitations  2) Are you currently experiencing lightheadedness, SOB or CP?  No, patient states he has been lightheaded, but she is not currently lightheaded.       Denies dizziness.  3) Do you have a history of afib (atrial fibrillation) or irregular heart rhythm? No   4) Have you checked your BP or HR? (document readings if available):   02/08: BP- 161/84 HR- 62  02/07: BP- 162/106 HR- 62  02/06: BP- 146/87 HR- 69  BP- 161/98  HR- 67  02/05: BP- 152/99   5) Are you experiencing any other symptoms? No

## 2020-08-17 DIAGNOSIS — M545 Low back pain, unspecified: Secondary | ICD-10-CM | POA: Diagnosis not present

## 2020-08-17 DIAGNOSIS — M7631 Iliotibial band syndrome, right leg: Secondary | ICD-10-CM | POA: Diagnosis not present

## 2020-08-17 DIAGNOSIS — M6281 Muscle weakness (generalized): Secondary | ICD-10-CM | POA: Diagnosis not present
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
## 2020-08-19 ENCOUNTER — Encounter: Payer: Self-pay | Admitting: Physician Assistant

## 2020-08-19 ENCOUNTER — Other Ambulatory Visit: Payer: Self-pay

## 2020-08-19 ENCOUNTER — Ambulatory Visit: Payer: Medicare HMO | Admitting: Physician Assistant

## 2020-08-19 VITALS — BP 132/70 | HR 69 | Ht 60.0 in | Wt 159.4 lb

## 2020-08-19 DIAGNOSIS — K219 Gastro-esophageal reflux disease without esophagitis: Secondary | ICD-10-CM | POA: Diagnosis not present

## 2020-08-19 DIAGNOSIS — R0602 Shortness of breath: Secondary | ICD-10-CM

## 2020-08-19 DIAGNOSIS — E782 Mixed hyperlipidemia: Secondary | ICD-10-CM | POA: Diagnosis not present

## 2020-08-19 DIAGNOSIS — R002 Palpitations: Secondary | ICD-10-CM | POA: Diagnosis not present

## 2020-08-19 DIAGNOSIS — I2510 Atherosclerotic heart disease of native coronary artery without angina pectoris: Secondary | ICD-10-CM | POA: Diagnosis not present

## 2020-08-19 DIAGNOSIS — I10 Essential (primary) hypertension: Secondary | ICD-10-CM | POA: Diagnosis not present

## 2020-08-19 MED ORDER — ISOSORBIDE MONONITRATE ER 60 MG PO TB24: 60.0000 mg | ORAL_TABLET | Freq: Every day | ORAL | 3 refills | Status: DC

## 2020-08-19 NOTE — Patient Instructions (Signed)
Medication Instructions:  Your physician has recommended you make the following change in your medication:   INCREASE: Isosorbide to 60mg  daily  *If you need a refill on your cardiac medications before your next appointment, please call your pharmacy*   Lab Work: Today: BMET, Mag, BNP, CBC  If you have labs (blood work) drawn today and your tests are completely normal, you will receive your results only by: Marland Kitchen MyChart Message (if you have MyChart) OR . A paper copy in the mail If you have any lab test that is abnormal or we need to change your treatment, we will call you to review the results.   Testing/Procedures: Your physician has requested that you have an echocardiogram. Echocardiography is a painless test that uses sound waves to create images of your heart. It provides your doctor with information about the size and shape of your heart and how well your heart's chambers and valves are working. This procedure takes approximately one hour. There are no restrictions for this procedure.  Your physician has requested that you have a lexiscan myoview. Please follow instruction sheet, as given.  Follow-Up: At [facility], you and your health needs are our priority.  As part of our continuing mission to provide you with exceptional heart care, we have created designated Provider Care Teams.  These Care Teams include your primary Cardiologist (physician) and Advanced Practice Providers (APPs -  Physician Assistants and Nurse Practitioners) who all work together to provide you with the care you need, when you need it.  Your next appointment:   09/14/2020  The format for your next appointment:   In Person  Provider:   [name], PA-C

## 2020-08-20 LAB — BASIC METABOLIC PANEL
BUN/Creatinine Ratio: 21 (ref 12–28)
BUN: 22 mg/dL (ref 8–27)
CO2: 22 mmol/L (ref 20–29)
Calcium: 9.5 mg/dL (ref 8.7–10.3)
Chloride: 101 mmol/L (ref 96–106)
Creatinine, Ser: 1.04 mg/dL — ABNORMAL HIGH (ref 0.57–1.00)
GFR calc Af Amer: 57 mL/min/{1.73_m2} — ABNORMAL LOW (ref >59)
GFR calc non Af Amer: 50 mL/min/{1.73_m2} — ABNORMAL LOW (ref >59)
Glucose: 130 mg/dL — ABNORMAL HIGH (ref 65–99)
Potassium: 4.8 mmol/L (ref 3.5–5.2)
Sodium: 140 mmol/L (ref 134–144)

## 2020-08-20 LAB — MAGNESIUM: Magnesium: 1.7 mg/dL (ref 1.6–2.3)

## 2020-08-20 LAB — CBC

## 2020-08-20 LAB — PRO B NATRIURETIC PEPTIDE: NT-Pro BNP: 428 pg/mL (ref 0–738)

## 2020-08-22 ENCOUNTER — Other Ambulatory Visit: Payer: Self-pay | Admitting: Family Medicine

## 2020-08-22 ENCOUNTER — Other Ambulatory Visit: Payer: Medicare HMO | Admitting: *Deleted

## 2020-08-22 ENCOUNTER — Other Ambulatory Visit: Payer: Self-pay

## 2020-08-22 ENCOUNTER — Telehealth: Payer: Self-pay

## 2020-08-22 DIAGNOSIS — I10 Essential (primary) hypertension: Secondary | ICD-10-CM

## 2020-08-22 DIAGNOSIS — I2510 Atherosclerotic heart disease of native coronary artery without angina pectoris: Secondary | ICD-10-CM

## 2020-08-22 DIAGNOSIS — E782 Mixed hyperlipidemia: Secondary | ICD-10-CM | POA: Diagnosis not present

## 2020-08-22 DIAGNOSIS — R002 Palpitations: Secondary | ICD-10-CM | POA: Diagnosis not present

## 2020-08-22 DIAGNOSIS — R0602 Shortness of breath: Secondary | ICD-10-CM

## 2020-08-22 LAB — CBC WITH DIFFERENTIAL/PLATELET
Basophils Absolute: 0 10*3/uL (ref 0.0–0.2)
Basos: 0 %
EOS (ABSOLUTE): 0.2 10*3/uL (ref 0.0–0.4)
Eos: 3 %
Hematocrit: 39.2 % (ref 34.0–46.6)
Hemoglobin: 13 g/dL (ref 11.1–15.9)
Lymphocytes Absolute: 1.7 10*3/uL (ref 0.7–3.1)
Lymphs: 26 %
MCH: 29.9 pg (ref 26.6–33.0)
MCHC: 33.2 g/dL (ref 31.5–35.7)
MCV: 90 fL (ref 79–97)
Monocytes Absolute: 0.6 10*3/uL (ref 0.1–0.9)
Monocytes: 10 %
Neutrophils Absolute: 4 10*3/uL (ref 1.4–7.0)
Neutrophils: 61 %
Platelets: 144 10*3/uL — ABNORMAL LOW (ref 150–450)
RBC: 4.35 x10E6/uL (ref 3.77–5.28)
RDW: 14 % (ref 11.7–15.4)
WBC: 6.5 10*3/uL (ref 3.4–10.8)

## 2020-08-22 NOTE — Telephone Encounter (Signed)
Patient needs CBC redrawn. Will place order for CBC. Patient also needed sooner echo. Rescheduled echo for 08/26/20.

## 2020-08-23 ENCOUNTER — Telehealth: Payer: Self-pay

## 2020-08-23 NOTE — Telephone Encounter (Signed)
-----   Message from Liliane Shi, [place] sent at 08/22/2020  9:40 PM EST ----- Hgb is normal.  PLAN:  - Continue current medications/treatment plan and follow up as scheduled.  - Keep appt for echocardiogram this Friday 2/18 - How is she feeling since we increased her Isosorbide at her office visit? [name], PA-C    08/22/2020 9:37 PM

## 2020-08-23 NOTE — Telephone Encounter (Signed)
Pt verbalized understanding of her lab results and will keep her Echo appt for 08/26/20. Pt reports that she has been feeling well with much improvement since she had her Isosorbide increased. She will continue to monitor and call if she has any changes.

## 2020-08-24 ENCOUNTER — Telehealth: Payer: Self-pay | Admitting: Pharmacist

## 2020-08-24 NOTE — Progress Notes (Addendum)
Chronic Care Management Pharmacy Assistant   Name: Heather [name]  MRN: [id number] DOB: May 03, 1935  Reason for Encounter:General Medication Review Call  Patient Questions:  1.  Have you seen any other providers since your last visit? Yes.   2.  Any changes in your medicines or health? Yes.  PCP : Mosie Lukes, MD   Their chronic conditions include: Hypertension, Hyperlipidemia/CAD, Diabetes, GERD, RLS.  Office Visits: 07/14/20 Fam Med. Post op appointment from surgery. Per note: The patients back pain was worsening. Doctor encouraged healthy diet and increase exercise. No medication changes.  Consults: 08/19/20 Cardiology [name] T, PA-C. Per note: We discussed potential work-up to include nuclear stress test versus cardiac catheterization.  She would like to pursue stress testing initially and avoid invasive testing if possible. CHANGED Isosorbide Mononitrate 60 mg tablet to daily.  03/21/20 Obstetrics and Gynecology Bovard-Stuckert, Jeral Fruit. For pelvic and perineal pain, Urge incontinence. No information given.  Hospital: 04/20/20 Isabel Caprice, MD. Scheduled Surgery for Pelvic relaxation due to rectocele.    Allergies:   Allergies  Allergen Reactions   Baclofen Other (See Comments)    Hyperactivity    Nitrofurantoin Rash    Medications: Outpatient Encounter Medications as of 08/24/2020  Medication Sig   acetaminophen (TYLENOL) 500 MG tablet Take 2 tablets (1,000 mg total) by mouth every 6 (six) hours as needed for mild pain.   amLODipine (NORVASC) 5 MG tablet TAKE 1 TABLET EVERY DAY   Ascorbic Acid (VITAMIN C) 500 MG CHEW Chew by mouth daily.   aspirin EC 81 MG tablet Take 1 tablet (81 mg total) by mouth daily.   Blood Glucose Monitoring Suppl (TRUE METRIX AIR GLUCOSE METER) w/Device KIT USE TO CHECK BLOOD SUGAR ONCE DAILY AND AS NEEDED.  DX CODE E11.9   Cholecalciferol (VITAMIN D) 50 MCG (2000 UT) CAPS Take 2,000 Units by mouth daily.    famotidine  (PEPCID) 20 MG tablet Take 20 mg by mouth at bedtime as needed for heartburn or indigestion.   glucose blood (TRUE METRIX BLOOD GLUCOSE TEST) test strip USE TO CHECK BLOOD SUGAR ONCE A DAY OR AS NEEDED.  DX CODE: E11.9   hyoscyamine (LEVSIN SL) 0.125 MG SL tablet Place 1 tablet (0.125 mg total) under the tongue every 4 (four) hours as needed.   isosorbide mononitrate (IMDUR) 60 MG 24 hr tablet Take 1 tablet (60 mg total) by mouth daily.   losartan (COZAAR) 100 MG tablet TAKE 1 TABLET EVERY DAY   magnesium oxide (MAG-OX) 400 MG tablet Take 400 mg by mouth 2 (two) times daily.   metoprolol succinate (TOPROL-XL) 50 MG 24 hr tablet Take 1 tablet (50 mg total) by mouth daily.   omeprazole (PRILOSEC) 20 MG capsule Take 1 capsule (20 mg total) by mouth daily as needed.   ondansetron (ZOFRAN ODT) 4 MG disintegrating tablet Take 1 tablet (4 mg total) by mouth every 6 (six) hours as needed for nausea or vomiting.   OVER THE COUNTER MEDICATION Zinc q day   potassium chloride SA (KLOR-CON) 20 MEQ tablet Take 1 tablet (20 mEq total) by mouth daily.   pramipexole (MIRAPEX) 0.25 MG tablet TAKE 1 TABLET TWICE DAILY   simvastatin (ZOCOR) 20 MG tablet TAKE 1 TABLET AT BEDTIME   traMADol (ULTRAM) 50 MG tablet Take 1-2 tablets (50-100 mg total) by mouth every 6 (six) hours as needed for moderate pain.   TRUEplus Lancets 33G MISC USE TO CHECK BLOOD SUGAR ONCE DAILY AND AS NEEDED.  DX  CODE: E11.9   No facility-administered encounter medications on file as of 08/24/2020.    Current Diagnosis: Patient Active Problem List   Diagnosis Date Noted   Low back pain radiating to right leg 07/14/2020   Pelvic relaxation due to rectocele 04/06/2020   Prolapse of female pelvic organs 12/22/2019   Pelvic relaxation 12/22/2019   Right hip pain 10/13/2019   Left foot pain 03/08/2019   Pelvic prolapse 03/08/2019   Pain of breast 01/29/2019   Overactive bladder 01/29/2019   Educated about COVID-19 virus infection 12/02/2018    Hyperlipidemia associated with type 2 diabetes mellitus ([place]) 05/27/2017   Right shoulder pain 04/29/2015   Diarrhea 04/17/2015   Headache 04/17/2015   Multinodular goiter 01/17/2015   Peroneal DVT (deep venous thrombosis) ([place]) 12/22/2014   S/P total knee arthroplasty 11/29/2014   Arthritis of knee, degenerative 11/19/2014   Right knee pain 11/19/2014   Neck pain 10/29/2014   Insomnia 02/05/2014   Preventative health care 11/22/2013   RLS (restless legs syndrome) 10/04/2013   Lower urinary tract infectious disease 10/04/2013   Cataracts, bilateral 04/02/2013   Leg swelling 01/17/2012   Hypokalemia 01/05/2012   Anemia 01/05/2012   Depression with anxiety 01/05/2012   Postoperative anemia due to acute blood loss 11/15/2011   Staphylococcus aureus carrier 11/15/2011   Pre-syncope 10/13/2011   DM (diabetes mellitus) ([place]) 10/06/2011   Pulmonary nodule 10/06/2011   Epigastric pain 10/06/2011   DJD (degenerative joint disease) of knee 09/18/2011   It band syndrome, right 08/28/2011   Renal insufficiency 08/28/2011   CORONARY ATHEROSCLEROSIS NATIVE CORONARY ARTERY 03/07/2010   Coronary atherosclerosis 01/18/2009   FATIGUE 01/18/2009   PERSISTENT DISORDER INITIATING/MAINTAINING SLEEP 09/09/2008   DERMATITIS 10/16/2007   PERIPHERAL EDEMA 10/16/2007   PALPITATIONS 10/16/2007   Essential hypertension 04/14/2007   GERD 04/14/2007   TRANSIENT ISCHEMIC ATTACK, HX OF 04/14/2007    Goals Addressed   None    GEN Call: Patient stated she is experiencing some issues with her heart and has a ECHO tomorrow. Patient stated she is able to do for herself but any extra activities she is not able to do so. She stated she gets short of breath easily. She stated her appetite has picked up and she's eating more.  Patent stated she drinks about two cups of water per day. She stated she also drinks decaf coffee and decaf tea (HOT) mostly decaf tea. Patent stated she doesn't have any concerns about  the price of her medications at this time. Patient stated she checks her blood sugar daily and its been within normal range.She stated today it was 117.  Follow-Up:  Pharmacist Review   Charlann Lange, RMA Clinical Pharmacist Assistant [phone]  6 minutes spent in review, coordination, and documentation.  Reviewed by: Beverly Milch, PharmD Clinical Pharmacist [facility] [phone]

## 2020-08-26 ENCOUNTER — Other Ambulatory Visit: Payer: Self-pay

## 2020-08-26 ENCOUNTER — Ambulatory Visit (HOSPITAL_BASED_OUTPATIENT_CLINIC_OR_DEPARTMENT_OTHER): Payer: Medicare HMO

## 2020-08-26 ENCOUNTER — Emergency Department (HOSPITAL_COMMUNITY)
Admission: EM | Admit: 2020-08-26 | Discharge: 2020-08-27 | Disposition: A | Discharge status: Home / Self Care | Payer: Medicare HMO | Attending: Emergency Medicine | Admitting: Emergency Medicine

## 2020-08-26 ENCOUNTER — Encounter: Payer: Self-pay | Admitting: Physician Assistant

## 2020-08-26 ENCOUNTER — Emergency Department (HOSPITAL_COMMUNITY): Payer: Medicare HMO

## 2020-08-26 DIAGNOSIS — Z79899 Other long term (current) drug therapy: Secondary | ICD-10-CM | POA: Diagnosis not present

## 2020-08-26 DIAGNOSIS — R072 Precordial pain: Secondary | ICD-10-CM | POA: Insufficient documentation

## 2020-08-26 DIAGNOSIS — Z86718 Personal history of other venous thrombosis and embolism: Secondary | ICD-10-CM | POA: Diagnosis not present

## 2020-08-26 DIAGNOSIS — I2510 Atherosclerotic heart disease of native coronary artery without angina pectoris: Secondary | ICD-10-CM

## 2020-08-26 DIAGNOSIS — I491 Atrial premature depolarization: Secondary | ICD-10-CM | POA: Diagnosis not present

## 2020-08-26 DIAGNOSIS — R0602 Shortness of breath: Secondary | ICD-10-CM

## 2020-08-26 DIAGNOSIS — E1165 Type 2 diabetes mellitus with hyperglycemia: Secondary | ICD-10-CM | POA: Diagnosis not present

## 2020-08-26 DIAGNOSIS — Z8673 Personal history of transient ischemic attack (TIA), and cerebral infarction without residual deficits: Secondary | ICD-10-CM | POA: Insufficient documentation

## 2020-08-26 DIAGNOSIS — R7989 Other specified abnormal findings of blood chemistry: Secondary | ICD-10-CM | POA: Diagnosis not present

## 2020-08-26 DIAGNOSIS — H538 Other visual disturbances: Secondary | ICD-10-CM | POA: Insufficient documentation

## 2020-08-26 DIAGNOSIS — I252 Old myocardial infarction: Secondary | ICD-10-CM | POA: Diagnosis not present

## 2020-08-26 DIAGNOSIS — I10 Essential (primary) hypertension: Secondary | ICD-10-CM | POA: Insufficient documentation

## 2020-08-26 DIAGNOSIS — Z7982 Long term (current) use of aspirin: Secondary | ICD-10-CM | POA: Insufficient documentation

## 2020-08-26 DIAGNOSIS — R079 Chest pain, unspecified: Secondary | ICD-10-CM | POA: Diagnosis not present

## 2020-08-26 DIAGNOSIS — Z96653 Presence of artificial knee joint, bilateral: Secondary | ICD-10-CM | POA: Insufficient documentation

## 2020-08-26 DIAGNOSIS — Z955 Presence of coronary angioplasty implant and graft: Secondary | ICD-10-CM | POA: Insufficient documentation

## 2020-08-26 DIAGNOSIS — Z8541 Personal history of malignant neoplasm of cervix uteri: Secondary | ICD-10-CM | POA: Diagnosis not present

## 2020-08-26 DIAGNOSIS — R0789 Other chest pain: Secondary | ICD-10-CM | POA: Diagnosis not present

## 2020-08-26 DIAGNOSIS — I700 Atherosclerosis of aorta: Secondary | ICD-10-CM | POA: Diagnosis not present

## 2020-08-26 DIAGNOSIS — K449 Diaphragmatic hernia without obstruction or gangrene: Secondary | ICD-10-CM | POA: Diagnosis not present

## 2020-08-26 DIAGNOSIS — E119 Type 2 diabetes mellitus without complications: Secondary | ICD-10-CM | POA: Diagnosis not present

## 2020-08-26 DIAGNOSIS — J9811 Atelectasis: Secondary | ICD-10-CM | POA: Diagnosis not present

## 2020-08-26 DIAGNOSIS — I4510 Unspecified right bundle-branch block: Secondary | ICD-10-CM | POA: Diagnosis not present

## 2020-08-26 HISTORY — DX: Personal history of other medical treatment: Z92.89

## 2020-08-26 LAB — BASIC METABOLIC PANEL
Anion gap: 9 (ref 5–15)
BUN: 24 mg/dL — ABNORMAL HIGH (ref 8–23)
CO2: 25 mmol/L (ref 22–32)
Calcium: 9.4 mg/dL (ref 8.9–10.3)
Chloride: 105 mmol/L (ref 98–111)
Creatinine, Ser: 1.11 mg/dL — ABNORMAL HIGH (ref 0.44–1.00)
GFR, Estimated: 49 mL/min — ABNORMAL LOW (ref >60)
Glucose, Bld: 211 mg/dL — ABNORMAL HIGH (ref 70–99)
Potassium: 4.6 mmol/L (ref 3.5–5.1)
Sodium: 139 mmol/L (ref 135–145)

## 2020-08-26 LAB — CBC
HCT: 40.3 % (ref 36.0–46.0)
Hemoglobin: 12.9 g/dL (ref 12.0–15.0)
MCH: 29.7 pg (ref 26.0–34.0)
MCHC: 32 g/dL (ref 30.0–36.0)
MCV: 92.6 fL (ref 80.0–100.0)
Platelets: 166 10*3/uL (ref 150–400)
RBC: 4.35 MIL/uL (ref 3.87–5.11)
RDW: 13 % (ref 11.5–15.5)
WBC: 5.1 10*3/uL (ref 4.0–10.5)
nRBC: 0 % (ref 0.0–0.2)

## 2020-08-26 LAB — ECHOCARDIOGRAM COMPLETE
Area-P 1/2: 3.37 cm2
S' Lateral: 2.4 cm

## 2020-08-26 LAB — TROPONIN I (HIGH SENSITIVITY): Troponin I (High Sensitivity): 7 ng/L (ref <18)

## 2020-08-26 NOTE — ED Provider Notes (Incomplete)
[facility] EMERGENCY DEPARTMENT Provider Note   CSN: [id number] Arrival date & time: 08/26/20  2152     History Chief Complaint  Patient presents with  . Chest Pain    Heather [name] is a 86 y.o. female.   Patient complains of feeling like her heart is 'beating out of her chest' for the past three weeks.  Tonight she states it was worse and also had some midsternal chest pain.  She was lying down when the chest pain started.  The feeling comes and goes, but lately feels like it is pounding more often than it is normal.  Pain does not radiate. No n/v/d/abd pain. No diaphoresis. No headaches.  Has blurred vision that comes and goes as well but no other visoin changes.  Has a hx of NSTEMI in 2006 and subsequent stent placement.  No cardiac issues since then.  She had an echo performed today.          Past Medical History:  Diagnosis Date  . Cancer [facility])    cervical cancer after hysterectomy  . Coronary artery disease cardiologist--- dr Burt Knack   hx NSTEMI  w/ cardiac cath 03-16-2005 PCI with DES to LCx;   cardiac cath 06-27-2005  PCI with DES to RCA with residual dx LAD manage medcially;  myoview/ lexiscan 01-26-20211 normal no ischemia, ef 70%;  stress echo w/ dobutamine 10-24-2011 negative ishcmeia , normal ef  . Diabetes mellitus type 2, diet-controlled ([place])    followed by pcp  diet controlled does not check cbg  . Echocardiogram 08/2020    Echocardiogram 2/22: EF 55-60, no RWMA, mild LVH, Gr 2 DD, GLS-21.7%, normal RVSF, trivial MR, RVSP 39.5  . GERD (gastroesophageal reflux disease)   . Hiatal hernia    recurrence,  hx HH repair 1989  . History of cervical cancer    areas burned off   . History of DVT of lower extremity 2016   11-29-2014 post op right TKA of right lower extremity and completed xarelto   . History of esophageal stricture    hx s/p dilatation's  . History of gastric ulcer 2005 approx.  Marland Kitchen History of palpitations 2010   event monitor  07-07-2009 showed NSR w/ freq. SVT ectopies with short runs, rare PVCs  . History of TIA (transient ischemic attack) 06/1999   12-15-2019  per pt had several TIA between 12/ 2000 to 02/ 2001 , was sent to specialist _0 , had test that was normal and pt stated no TIAs since but has residual of essential tremors of right arm/ hand  . Hypertension   . IT band syndrome   . Migraine    "ice pick headche lasts about 30 seconds"  . Mixed hyperlipidemia   . Multiple thyroid nodules    followed by pcp---   ultrasound 11-22-2014 no bx   (12-15-2019 per pt had a endocrinologist and was told did not need bx)  . OA (osteoarthritis)    knees, elbow, hip, ankles  . Occasional tremors    right arm/ hand  s/p TIA residual 2000  . Osteoporosis    taking vitamin d  . Pelvic relaxation   . 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 Right bundle branch block (RBBB) with left anterior fascicular block (LAFB)   . RLS (restless legs syndrome)   . S/P drug eluting coronary stent placement 2006   03-16-2005  PCI x1 DES to LCx;   06-27-2005  PCI x1 DES to RCA  .  SUI (stress urinary incontinence, female)     Patient Active Problem List   Diagnosis Date Noted  . Low back pain radiating to right leg 07/14/2020  . 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 Prolapse of female pelvic organs 12/22/2019  . Pelvic relaxation 12/22/2019  . Right hip pain 10/13/2019  . Left foot pain 03/08/2019  . Pelvic prolapse 03/08/2019  . Pain of breast 01/29/2019  . Overactive bladder 01/29/2019  . Educated about COVID-19 virus infection 12/02/2018  . Hyperlipidemia associated with type 2 diabetes mellitus ([place]) 05/27/2017  . Right shoulder pain 04/29/2015  . Diarrhea 04/17/2015  . Headache 04/17/2015  . Multinodular goiter 01/17/2015  . Peroneal DVT (deep venous thrombosis) ([place]) 12/22/2014  . S/P total knee arthroplasty 11/29/2014  . Arthritis of knee, degenerative 11/19/2014  . Right knee pain 11/19/2014   . Neck pain 10/29/2014  . Insomnia 02/05/2014  . Preventative health care 11/22/2013  . RLS (restless legs syndrome) 10/04/2013  . Lower urinary tract infectious disease 10/04/2013  . Cataracts, bilateral 04/02/2013  . Leg swelling 01/17/2012  . Hypokalemia 01/05/2012  . Anemia 01/05/2012  . Depression with anxiety 01/05/2012  . Postoperative anemia due to acute blood loss 11/15/2011  . 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 Pre-syncope 10/13/2011  . DM (diabetes mellitus) ([place]) 10/06/2011  . Pulmonary nodule 10/06/2011  . Epigastric pain 10/06/2011  . DJD (degenerative joint disease) of knee 09/18/2011  . It band syndrome, right 08/28/2011  . Renal insufficiency 08/28/2011  . CORONARY ATHEROSCLEROSIS NATIVE CORONARY ARTERY 03/07/2010  . Coronary atherosclerosis 01/18/2009  . FATIGUE 01/18/2009  . PERSISTENT DISORDER INITIATING/MAINTAINING SLEEP 09/09/2008  . DERMATITIS 10/16/2007  . PERIPHERAL EDEMA 10/16/2007  . PALPITATIONS 10/16/2007  . Essential hypertension 04/14/2007  . GERD 04/14/2007  . TRANSIENT ISCHEMIC ATTACK, HX OF 04/14/2007    Past Surgical History:  Procedure Laterality Date  . ANTERIOR AND POSTERIOR REPAIR N/A 12/22/2019   Procedure: ANTERIOR (CYSTOCELE)  REPAIR;  Surgeon: Janyth Contes, MD;  Location: [place];  Service: Gynecology;  Laterality: N/A;  . CATARACT EXTRACTION W/ INTRAOCULAR LENS  IMPLANT, BILATERAL  2015  . COLONOSCOPY  last one ?  . CORONARY ANGIOPLASTY WITH STENT PLACEMENT  03-16-2005   dr Lia Foyer   PCI and DES x1 to LCx  . CORONARY ANGIOPLASTY WITH STENT PLACEMENT  06-27-2005  dr Lia Foyer   PCI and DES x1 to RCA with residual disease LAD 70-80% to manage medically  . FOOT SURGERY Left 1990s   left foot stress fracture repair, per pt no hardware  . HIATAL HERNIA REPAIR  1989  . KNEE ARTHROSCOPY Bilateral right ?/   left x2 , last one 09-12-2009 @ [place]  . RECTOCELE REPAIR N/A 04/20/2020   Procedure:  POSTERIOR REPAIR (RECTOCELE);  Surgeon: Janyth Contes, MD;  Location: [facility];  Service: Gynecology;  Laterality: N/A;  . TOTAL KNEE ARTHROPLASTY  11/12/2011   Procedure: TOTAL KNEE ARTHROPLASTY;  Surgeon: Lorn Junes, MD;  Location: [place];  Service: Orthopedics;  Laterality: Left;  Dr Noemi Chapel wants 90 minutes for this case  . TOTAL KNEE ARTHROPLASTY Right 11/29/2014   Procedure: RIGHT TOTAL KNEE ARTHROPLASTY;  Surgeon: Vickey Huger, MD;  Location: [place];  Service: Orthopedics;  Laterality: Right;  . UPPER GASTROINTESTINAL ENDOSCOPY  last one 04-25-2017   with dilatation esophageal stricture and savary dilatation  . VAGINAL HYSTERECTOMY  1988    no ovaries removed for bleeeding     OB History   No  obstetric history on file.     Family History  Problem Relation Age of Onset  . Stroke Father        family hx of M 1st degree relative <50  . Coronary artery disease Mother   . Heart disease Mother   . Depression Brother   . Stroke Brother   . Diabetes Brother   . Cancer Brother        bladder with mets  . Diabetes Daughter        borderline  . Hypertension Daughter   . Arthritis Other        family hx of  . Hypertension Other        family hx of  . Other Other        family hx of cardiovascular disorder  . Thyroid disease Daughter   . Breast cancer Neg Hx   . Colon cancer Neg Hx   . Anesthesia problems Neg Hx   . Hypotension Neg Hx   . Malignant hyperthermia Neg Hx   . Pseudochol deficiency Neg Hx   . Colon polyps Neg Hx   . Esophageal cancer Neg Hx   . Rectal cancer Neg Hx   . Stomach cancer Neg Hx     Social History   Tobacco Use  . Smoking status: Never Smoker  . Smokeless tobacco: Never Used  Vaping Use  . Vaping Use: Never used  Substance Use Topics  . Alcohol use: No    Alcohol/week: 0.0 standard drinks  . Drug use: Never    Home Medications Prior to Admission medications   Medication Sig Start Date End Date Taking?  Authorizing Provider  acetaminophen (TYLENOL) 500 MG tablet Take 2 tablets (1,000 mg total) by mouth every 6 (six) hours as needed for mild pain. 12/23/19   Bovard-Stuckert, Jeral Fruit, MD  amLODipine (NORVASC) 5 MG tablet TAKE 1 TABLET EVERY DAY 08/22/20   Mosie Lukes, MD  Ascorbic Acid (VITAMIN C) 500 MG CHEW Chew by mouth daily.    [provider]  aspirin EC 81 MG tablet Take 1 tablet (81 mg total) by mouth daily. 05/04/15   [name], MD  Blood Glucose Monitoring Suppl (TRUE METRIX AIR GLUCOSE METER) w/Device KIT USE TO CHECK BLOOD SUGAR ONCE DAILY AND AS NEEDED.  DX CODE E11.9 07/27/20   Mosie Lukes, MD  Cholecalciferol (VITAMIN D) 50 MCG (2000 UT) CAPS Take 2,000 Units by mouth daily.     [provider]  famotidine (PEPCID) 20 MG tablet Take 20 mg by mouth at bedtime as needed for heartburn or indigestion.    [provider]  glucose blood (TRUE METRIX BLOOD GLUCOSE TEST) test strip USE TO CHECK BLOOD SUGAR ONCE A DAY OR AS NEEDED.  DX CODE: E11.9 07/27/20   Mosie Lukes, MD  hyoscyamine (LEVSIN SL) 0.125 MG SL tablet Place 1 tablet (0.125 mg total) under the tongue every 4 (four) hours as needed. 03/06/19   Mosie Lukes, MD  isosorbide mononitrate (IMDUR) 60 MG 24 hr tablet Take 1 tablet (60 mg total) by mouth daily. 08/19/20   [name] T, PA-C  losartan (COZAAR) 100 MG tablet TAKE 1 TABLET EVERY DAY 06/04/20   Mosie Lukes, MD  magnesium oxide (MAG-OX) 400 MG tablet Take 400 mg by mouth 2 (two) times daily.    [provider]  metoprolol succinate (TOPROL-XL) 50 MG 24 hr tablet Take 1 tablet (50 mg total) by mouth daily. 01/01/20   Burt Knack,  Casimiro Needle, MD  omeprazole (PRILOSEC) 20 MG capsule Take 1 capsule (20 mg total) by mouth daily as needed. 03/01/20   Bradd Canary, MD  ondansetron (ZOFRAN ODT) 4 MG disintegrating tablet Take 1 tablet (4 mg total) by mouth every 6 (six) hours as needed for nausea or vomiting. 12/15/19   Armbruster, Willaim Rayas,  MD  OVER THE COUNTER MEDICATION Zinc q day    [provider]  potassium chloride SA (KLOR-CON) 20 MEQ tablet Take 1 tablet (20 mEq total) by mouth daily. 01/01/20   Tonny Bollman, MD  pramipexole (MIRAPEX) 0.25 MG tablet TAKE 1 TABLET TWICE DAILY 05/05/20   Bradd Canary, MD  simvastatin (ZOCOR) 20 MG tablet TAKE 1 TABLET AT BEDTIME 11/13/19   Tonny Bollman, MD  traMADol (ULTRAM) 50 MG tablet Take 1-2 tablets (50-100 mg total) by mouth every 6 (six) hours as needed for moderate pain. 04/21/20   Bovard-Stuckert, Augusto Gamble, MD  TRUEplus Lancets 33G MISC USE TO CHECK BLOOD SUGAR ONCE DAILY AND AS NEEDED.  DX CODE: E11.9 07/27/20   Bradd Canary, MD    Allergies    Baclofen and Nitrofurantoin  Review of Systems   Review of Systems  All other systems reviewed and are negative.   Physical Exam Updated Vital Signs BP (!) 157/76   Pulse 71   Temp 97.7 F (36.5 C) (Oral)   Resp 18   SpO2 98%   Physical Exam Vitals reviewed.  Constitutional:      General: She is not in acute distress.    Appearance: She is normal weight. She is not toxic-appearing.  HENT:     Head: Normocephalic.  Eyes:     Pupils: Pupils are equal, round, and reactive to light.  Cardiovascular:     Rate and Rhythm: Normal rate and regular rhythm.     Pulses:          Radial pulses are 2+ on the right side and 2+ on the left side.     Heart sounds: Normal heart sounds. No murmur heard.   Pulmonary:     Effort: Pulmonary effort is normal. No tachypnea.     Breath sounds: No decreased breath sounds.  Chest:     Comments: Tenderness to palpation over the left sternal border.  Abdominal:     General: Bowel sounds are normal.     Palpations: Abdomen is soft.     Tenderness: There is no abdominal tenderness.  Musculoskeletal:     Cervical back: Normal range of motion.  Skin:    General: Skin is warm and dry.  Neurological:     General: No focal deficit present.     Mental Status: She is alert and  oriented to person, place, and time.  Psychiatric:        Mood and Affect: Mood normal.     ED Results / Procedures / Treatments   Labs (all labs ordered are listed, but only abnormal results are displayed) Labs Reviewed  CBC  BASIC METABOLIC PANEL  TROPONIN I (HIGH SENSITIVITY)    EKG EKG Interpretation  Date/Time:  Friday August 26 2020 21:58:50 EST Ventricular Rate:  73 PR Interval:    QRS Duration: 136 QT Interval:  406 QTC Calculation: 448 R Axis:   -56 Text Interpretation: Sinus rhythm RBBB and LAFB Left ventricular hypertrophy ST elevation, consider inferior injury Confirmed by Tilden Fossa [phone]) on 08/26/2020 10:06:03 PM   Radiology ECHOCARDIOGRAM COMPLETE  Result Date: 08/26/2020    ECHOCARDIOGRAM REPORT  Patient Name:   Heather [name] Date of Exam: 08/26/2020 Medical Rec #:  [id number]      Height:       60.0 in Accession #:    [id number]     Weight:       159.4 lb Date of Birth:  1934/10/17      BSA:          1.695 m Patient Age:    79 years       BP:           132/70 mmHg Patient Gender: F              HR:           64 bpm. Exam Location:  [place] Procedure: 2D Echo, 3D Echo, Cardiac Doppler, Color Doppler and Strain Analysis                                 MODIFIED REPORT:  This report was modified by Rudean Haskell MD on 08/26/2020 due to Moved                             Finding to Impressions.  Indications:     R06.02 Shortness of breath  History:         Patient has prior history of Echocardiogram examinations, most                  recent 10/24/2011. CAD, DVT, Signs/Symptoms:Syncope; Risk                  Factors:Hypertension and Diabetes. Edema. Anemia.  Sonographer:     Jessee Avers, RDCS Referring Phys:  [id number] WEAVER Diagnosing Phys: Rudean Haskell MD IMPRESSIONS  1. Left ventricular ejection fraction, by estimation, is 55 to 60%. Left ventricular ejection fraction by 3D volume is 61 %. The left ventricle has normal function. The left  ventricle has no regional wall motion abnormalities. There is mild concentric left ventricular hypertrophy. Left ventricular diastolic parameters are consistent with Grade II diastolic dysfunction (pseudonormalization). The average left ventricular global longitudinal strain is -21.7 %. The global longitudinal strain is normal.  2. Right ventricular systolic function is normal. The right ventricular size is normal. There is mildly elevated pulmonary artery systolic pressure.  3. The mitral valve is abnormal. Trivial mitral valve regurgitation. No evidence of mitral stenosis.  4. The aortic valve is tricuspid. There is mild calcification of the aortic valve. There is mild thickening of the aortic valve. Aortic valve regurgitation is not visualized. No aortic stenosis is present.  5. The inferior vena cava is normal in size with greater than 50% respiratory variability, suggesting right atrial pressure of 3 mmHg.  6. Mechanism of mild tricuspid regurgitation suggestive of prolapse. In the setting eccentric jet, study may underestimate the severity of the regurgitation.M Comparison(s): A prior study was performed on 10/24/2011. Prior images reviewed side by side. Similar ventricular function from prior stress echo. FINDINGS  Left Ventricle: Left ventricular ejection fraction, by estimation, is 55 to 60%. Left ventricular ejection fraction by 3D volume is 61 %. The left ventricle has normal function. The left ventricle has no regional wall motion abnormalities. The average left ventricular global longitudinal strain is -21.7 %. The global longitudinal strain is normal. The left ventricular internal cavity size was small. There is mild concentric left ventricular hypertrophy.  Left ventricular diastolic parameters are consistent with Grade II diastolic dysfunction (pseudonormalization). Right Ventricle: The right ventricular size is normal. No increase in right ventricular wall thickness. Right ventricular systolic  function is normal. There is mildly elevated pulmonary artery systolic pressure. The tricuspid regurgitant velocity is 3.02  m/s, and with an assumed right atrial pressure of 3 mmHg, the estimated right ventricular systolic pressure is 30.8 mmHg. Left Atrium: Left atrial size was normal in size. Right Atrium: Right atrial size was normal in size. Pericardium: There is no evidence of pericardial effusion. Mitral Valve: The mitral valve is abnormal. Mild mitral annular calcification. Trivial mitral valve regurgitation. No evidence of mitral valve stenosis. Tricuspid Valve: Mechanism of regurgitation suggestive of prolapse. In the setting eccentric jet, study may underestimate the severity of the regurgitation. The tricuspid valve is grossly normal. Tricuspid valve regurgitation is mild. Aortic Valve: The aortic valve is tricuspid. There is mild calcification of the aortic valve. There is mild thickening of the aortic valve. Aortic valve regurgitation is not visualized. No aortic stenosis is present. Pulmonic Valve: The pulmonic valve was grossly normal. Pulmonic valve regurgitation is mild. No evidence of pulmonic stenosis. Aorta: The aortic root and ascending aorta are structurally normal, with no evidence of dilitation. Venous: The inferior vena cava is normal in size with greater than 50% respiratory variability, suggesting right atrial pressure of 3 mmHg. IAS/Shunts: The atrial septum is grossly normal.  LEFT VENTRICLE PLAX 2D LVIDd:         3.70 cm         Diastology LVIDs:         2.40 cm         LV e' medial:    5.03 cm/s LV PW:         1.20 cm         LV E/e' medial:  17.2 LV IVS:        1.30 cm         LV e' lateral:   9.00 cm/s LVOT diam:     2.00 cm         LV E/e' lateral: 9.6 LV SV:         87 LV SV Index:   51              2D LVOT Area:     3.14 cm        Longitudinal                                Strain                                2D Strain GLS  -22.3 %                                (A2C):                                 2D Strain GLS  -21.9 %                                (A3C):  2D Strain GLS  -20.9 %                                (A4C):                                2D Strain GLS  -21.7 %                                Avg:                                 3D Volume EF                                LV 3D EF:    Left                                             ventricular                                             ejection                                             fraction by                                             3D volume                                             is 61 %.                                 3D Volume EF:                                3D EF:        61 %                                LV EDV:       116 ml                                LV ESV:       5 ml                                LV  SV:        71 ml RIGHT VENTRICLE RV Basal diam:  3.40 cm RV S prime:     10.50 cm/s TAPSE (M-mode): 2.4 cm RVSP:           39.5 mmHg LEFT ATRIUM             Index       RIGHT ATRIUM           Index LA diam:        4.20 cm 2.48 cm/m  RA Pressure: 3.00 mmHg LA Vol (A2C):   35.2 ml 20.77 ml/m RA Area:     7.89 cm LA Vol (A4C):   35.6 ml 21.00 ml/m RA Volume:   13.00 ml  7.67 ml/m LA Biplane Vol: 35.4 ml 20.88 ml/m  AORTIC VALVE LVOT Vmax:   111.00 cm/s LVOT Vmean:  65.700 cm/s LVOT VTI:    0.277 m  AORTA Ao Root diam: 2.90 cm Ao Asc diam:  3.40 cm MITRAL VALVE               TRICUSPID VALVE                            TR Peak grad:   36.5 mmHg                            TR Vmax:        302.00 cm/s MV E velocity: 86.40 cm/s  Estimated RAP:  3.00 mmHg MV A velocity: 83.30 cm/s  RVSP:           39.5 mmHg MV E/A ratio:  1.04                            SHUNTS                            Systemic VTI:  0.28 m                            Systemic Diam: 2.00 cm Rudean Haskell MD Electronically signed by Rudean Haskell MD Signature Date/Time: 08/26/2020/2:30:31 PM     Final (Updated)     Procedures Procedures   Medications Ordered in ED Medications - No data to display  ED Course  I have reviewed the triage vital signs and the nursing notes.  Pertinent labs & imaging results that were available during my care of the patient were reviewed by me and considered in my medical decision making (see chart for details).    MDM Rules/Calculators/A&P                          Patient presents today with 3 weeks of 'pounding' in her chest and < 1day of midsternal chest pain.  Hx of NSTEMI in 2006 w/ stent placement but otherwise no significant cardiac history.  Had an echo performed today due to these symptoms and this was read as  Final Clinical Impression(s) / ED Diagnoses Final diagnoses:  None    Rx / DC Orders ED Discharge Orders    None

## 2020-08-26 NOTE — ED Provider Notes (Signed)
[facility] EMERGENCY DEPARTMENT Provider Note   CSN: [id number] Arrival date & time: 08/26/20  2152     History Chief Complaint  Patient presents with  . Chest Pain    Heather [name] is a 86 y.o. female.   Patient complains of feeling like her heart is 'beating out of her chest' for the past three weeks.  Tonight she states it was worse and also had some midsternal chest pain.  She was lying down when the chest pain started.  The feeling comes and goes, but lately feels like it is pounding more often than it is normal.  Pain does not radiate. No n/v/d/abd pain. No diaphoresis. No headaches.  Has blurred vision that comes and goes as well but no other visoin changes.  Has a hx of NSTEMI in 2006 and subsequent stent placement.  No cardiac issues since then.  She had an echo performed today.          Past Medical History:  Diagnosis Date  . Cancer [facility])    cervical cancer after hysterectomy  . Coronary artery disease cardiologist--- dr Burt Knack   hx NSTEMI  w/ cardiac cath 03-16-2005 PCI with DES to LCx;   cardiac cath 06-27-2005  PCI with DES to RCA with residual dx LAD manage medcially;  myoview/ lexiscan 01-26-20211 normal no ischemia, ef 70%;  stress echo w/ dobutamine 10-24-2011 negative ishcmeia , normal ef  . Diabetes mellitus type 2, diet-controlled ([place])    followed by pcp  diet controlled does not check cbg  . Echocardiogram 08/2020    Echocardiogram 2/22: EF 55-60, no RWMA, mild LVH, Gr 2 DD, GLS-21.7%, normal RVSF, trivial MR, RVSP 39.5  . GERD (gastroesophageal reflux disease)   . Hiatal hernia    recurrence,  hx HH repair 1989  . History of cervical cancer    areas burned off   . History of DVT of lower extremity 2016   11-29-2014 post op right TKA of right lower extremity and completed xarelto   . History of esophageal stricture    hx s/p dilatation's  . History of gastric ulcer 2005 approx.  Marland Kitchen History of palpitations 2010   event monitor  07-07-2009 showed NSR w/ freq. SVT ectopies with short runs, rare PVCs  . History of TIA (transient ischemic attack) 06/1999   12-15-2019  per pt had several TIA between 12/ 2000 to 02/ 2001 , was sent to specialist _0 , had test that was normal and pt stated no TIAs since but has residual of essential tremors of right arm/ hand  . Hypertension   . IT band syndrome   . Migraine    "ice pick headche lasts about 30 seconds"  . Mixed hyperlipidemia   . Multiple thyroid nodules    followed by pcp---   ultrasound 11-22-2014 no bx   (12-15-2019 per pt had a endocrinologist and was told did not need bx)  . OA (osteoarthritis)    knees, elbow, hip, ankles  . Occasional tremors    right arm/ hand  s/p TIA residual 2000  . Osteoporosis    taking vitamin d  . Pelvic relaxation   . 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 Right bundle branch block (RBBB) with left anterior fascicular block (LAFB)   . RLS (restless legs syndrome)   . S/P drug eluting coronary stent placement 2006   03-16-2005  PCI x1 DES to LCx;   06-27-2005  PCI x1 DES to RCA  .  SUI (stress urinary incontinence, female)     Patient Active Problem List   Diagnosis Date Noted  . Low back pain radiating to right leg 07/14/2020  . 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 Prolapse of female pelvic organs 12/22/2019  . Pelvic relaxation 12/22/2019  . Right hip pain 10/13/2019  . Left foot pain 03/08/2019  . Pelvic prolapse 03/08/2019  . Pain of breast 01/29/2019  . Overactive bladder 01/29/2019  . Educated about COVID-19 virus infection 12/02/2018  . Hyperlipidemia associated with type 2 diabetes mellitus ([place]) 05/27/2017  . Right shoulder pain 04/29/2015  . Diarrhea 04/17/2015  . Headache 04/17/2015  . Multinodular goiter 01/17/2015  . Peroneal DVT (deep venous thrombosis) ([place]) 12/22/2014  . S/P total knee arthroplasty 11/29/2014  . Arthritis of knee, degenerative 11/19/2014  . Right knee pain 11/19/2014   . Neck pain 10/29/2014  . Insomnia 02/05/2014  . Preventative health care 11/22/2013  . RLS (restless legs syndrome) 10/04/2013  . Lower urinary tract infectious disease 10/04/2013  . Cataracts, bilateral 04/02/2013  . Leg swelling 01/17/2012  . Hypokalemia 01/05/2012  . Anemia 01/05/2012  . Depression with anxiety 01/05/2012  . Postoperative anemia due to acute blood loss 11/15/2011  . 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 Pre-syncope 10/13/2011  . DM (diabetes mellitus) ([place]) 10/06/2011  . Pulmonary nodule 10/06/2011  . Epigastric pain 10/06/2011  . DJD (degenerative joint disease) of knee 09/18/2011  . It band syndrome, right 08/28/2011  . Renal insufficiency 08/28/2011  . CORONARY ATHEROSCLEROSIS NATIVE CORONARY ARTERY 03/07/2010  . Coronary atherosclerosis 01/18/2009  . FATIGUE 01/18/2009  . PERSISTENT DISORDER INITIATING/MAINTAINING SLEEP 09/09/2008  . DERMATITIS 10/16/2007  . PERIPHERAL EDEMA 10/16/2007  . PALPITATIONS 10/16/2007  . Essential hypertension 04/14/2007  . GERD 04/14/2007  . TRANSIENT ISCHEMIC ATTACK, HX OF 04/14/2007    Past Surgical History:  Procedure Laterality Date  . ANTERIOR AND POSTERIOR REPAIR N/A 12/22/2019   Procedure: ANTERIOR (CYSTOCELE)  REPAIR;  Surgeon: Janyth Contes, MD;  Location: [place];  Service: Gynecology;  Laterality: N/A;  . CATARACT EXTRACTION W/ INTRAOCULAR LENS  IMPLANT, BILATERAL  2015  . COLONOSCOPY  last one ?  . CORONARY ANGIOPLASTY WITH STENT PLACEMENT  03-16-2005   dr Lia Foyer   PCI and DES x1 to LCx  . CORONARY ANGIOPLASTY WITH STENT PLACEMENT  06-27-2005  dr Lia Foyer   PCI and DES x1 to RCA with residual disease LAD 70-80% to manage medically  . FOOT SURGERY Left 1990s   left foot stress fracture repair, per pt no hardware  . HIATAL HERNIA REPAIR  1989  . KNEE ARTHROSCOPY Bilateral right ?/   left x2 , last one 09-12-2009 @ [place]  . RECTOCELE REPAIR N/A 04/20/2020   Procedure:  POSTERIOR REPAIR (RECTOCELE);  Surgeon: Janyth Contes, MD;  Location: [facility];  Service: Gynecology;  Laterality: N/A;  . TOTAL KNEE ARTHROPLASTY  11/12/2011   Procedure: TOTAL KNEE ARTHROPLASTY;  Surgeon: Lorn Junes, MD;  Location: [place];  Service: Orthopedics;  Laterality: Left;  Dr Noemi Chapel wants 90 minutes for this case  . TOTAL KNEE ARTHROPLASTY Right 11/29/2014   Procedure: RIGHT TOTAL KNEE ARTHROPLASTY;  Surgeon: Vickey Huger, MD;  Location: [place];  Service: Orthopedics;  Laterality: Right;  . UPPER GASTROINTESTINAL ENDOSCOPY  last one 04-25-2017   with dilatation esophageal stricture and savary dilatation  . VAGINAL HYSTERECTOMY  1988    no ovaries removed for bleeeding     OB History   No  obstetric history on file.     Family History  Problem Relation Age of Onset  . Stroke Father        family hx of M 1st degree relative <50  . Coronary artery disease Mother   . Heart disease Mother   . Depression Brother   . Stroke Brother   . Diabetes Brother   . Cancer Brother        bladder with mets  . Diabetes Daughter        borderline  . Hypertension Daughter   . Arthritis Other        family hx of  . Hypertension Other        family hx of  . Other Other        family hx of cardiovascular disorder  . Thyroid disease Daughter   . Breast cancer Neg Hx   . Colon cancer Neg Hx   . Anesthesia problems Neg Hx   . Hypotension Neg Hx   . Malignant hyperthermia Neg Hx   . Pseudochol deficiency Neg Hx   . Colon polyps Neg Hx   . Esophageal cancer Neg Hx   . Rectal cancer Neg Hx   . Stomach cancer Neg Hx     Social History   Tobacco Use  . Smoking status: Never Smoker  . Smokeless tobacco: Never Used  Vaping Use  . Vaping Use: Never used  Substance Use Topics  . Alcohol use: No    Alcohol/week: 0.0 standard drinks  . Drug use: Never    Home Medications Prior to Admission medications   Medication Sig Start Date End Date Taking?  Authorizing Provider  acetaminophen (TYLENOL) 500 MG tablet Take 2 tablets (1,000 mg total) by mouth every 6 (six) hours as needed for mild pain. 12/23/19   Bovard-Stuckert, Jeral Fruit, MD  amLODipine (NORVASC) 5 MG tablet TAKE 1 TABLET EVERY DAY 08/22/20   Mosie Lukes, MD  Ascorbic Acid (VITAMIN C) 500 MG CHEW Chew by mouth daily.    [provider]  aspirin EC 81 MG tablet Take 1 tablet (81 mg total) by mouth daily. 05/04/15   [name], MD  Blood Glucose Monitoring Suppl (TRUE METRIX AIR GLUCOSE METER) w/Device KIT USE TO CHECK BLOOD SUGAR ONCE DAILY AND AS NEEDED.  DX CODE E11.9 07/27/20   Mosie Lukes, MD  Cholecalciferol (VITAMIN D) 50 MCG (2000 UT) CAPS Take 2,000 Units by mouth daily.     [provider]  famotidine (PEPCID) 20 MG tablet Take 20 mg by mouth at bedtime as needed for heartburn or indigestion.    [provider]  glucose blood (TRUE METRIX BLOOD GLUCOSE TEST) test strip USE TO CHECK BLOOD SUGAR ONCE A DAY OR AS NEEDED.  DX CODE: E11.9 07/27/20   Mosie Lukes, MD  hyoscyamine (LEVSIN SL) 0.125 MG SL tablet Place 1 tablet (0.125 mg total) under the tongue every 4 (four) hours as needed. 03/06/19   Mosie Lukes, MD  isosorbide mononitrate (IMDUR) 60 MG 24 hr tablet Take 1 tablet (60 mg total) by mouth daily. 08/19/20   [name] T, PA-C  losartan (COZAAR) 100 MG tablet TAKE 1 TABLET EVERY DAY 06/04/20   Mosie Lukes, MD  magnesium oxide (MAG-OX) 400 MG tablet Take 400 mg by mouth 2 (two) times daily.    [provider]  metoprolol succinate (TOPROL-XL) 50 MG 24 hr tablet Take 1 tablet (50 mg total) by mouth daily. 01/01/20   Burt Knack,  Legrand Como, MD  omeprazole (PRILOSEC) 20 MG capsule Take 1 capsule (20 mg total) by mouth daily as needed. 03/01/20   Mosie Lukes, MD  ondansetron (ZOFRAN ODT) 4 MG disintegrating tablet Take 1 tablet (4 mg total) by mouth every 6 (six) hours as needed for nausea or vomiting. 12/15/19   Armbruster, Carlota Raspberry,  MD  OVER THE COUNTER MEDICATION Zinc q day    [provider]  potassium chloride SA (KLOR-CON) 20 MEQ tablet Take 1 tablet (20 mEq total) by mouth daily. 01/01/20   [name], MD  pramipexole (MIRAPEX) 0.25 MG tablet TAKE 1 TABLET TWICE DAILY 05/05/20   Mosie Lukes, MD  simvastatin (ZOCOR) 20 MG tablet TAKE 1 TABLET AT BEDTIME 11/13/19   [name], MD  traMADol (ULTRAM) 50 MG tablet Take 1-2 tablets (50-100 mg total) by mouth every 6 (six) hours as needed for moderate pain. 04/21/20   Bovard-Stuckert, Jeral Fruit, MD  TRUEplus Lancets 33G MISC USE TO CHECK BLOOD SUGAR ONCE DAILY AND AS NEEDED.  DX CODE: E11.9 07/27/20   Mosie Lukes, MD    Allergies    Baclofen and Nitrofurantoin  Review of Systems   Review of Systems  All other systems reviewed and are negative.   Physical Exam Updated Vital Signs BP (!) 147/70   Pulse 67   Temp 97.7 F (36.5 C) (Oral)   Resp 18   SpO2 98%   Physical Exam Vitals reviewed.  Constitutional:      General: She is not in acute distress.    Appearance: She is normal weight. She is not toxic-appearing.  HENT:     Head: Normocephalic.  Eyes:     Pupils: Pupils are equal, round, and reactive to light.  Cardiovascular:     Rate and Rhythm: Normal rate and regular rhythm.     Pulses:          Radial pulses are 2+ on the right side and 2+ on the left side.     Heart sounds: Normal heart sounds. No murmur heard.   Pulmonary:     Effort: Pulmonary effort is normal. No tachypnea.     Breath sounds: No decreased breath sounds.  Chest:     Comments: Tenderness to palpation over the left sternal border.  Abdominal:     General: Bowel sounds are normal.     Palpations: Abdomen is soft.     Tenderness: There is no abdominal tenderness.  Musculoskeletal:     Cervical back: Normal range of motion.  Skin:    General: Skin is warm and dry.  Neurological:     General: No focal deficit present.     Mental Status: She is alert and  oriented to person, place, and time.  Psychiatric:        Mood and Affect: Mood normal.     ED Results / Procedures / Treatments   Labs (all labs ordered are listed, but only abnormal results are displayed) Labs Reviewed  BASIC METABOLIC PANEL - Abnormal; Notable for the following components:      Result Value   Glucose, Bld 211 (*)    BUN 24 (*)    Creatinine, Ser 1.11 (*)    GFR, Estimated 49 (*)    All other components within normal limits  CBC  D-DIMER, QUANTITATIVE  TROPONIN I (HIGH SENSITIVITY)  TROPONIN I (HIGH SENSITIVITY)    EKG EKG Interpretation  Date/Time:  Friday August 26 2020 21:58:50 EST Ventricular Rate:  73 PR  Interval:    QRS Duration: 136 QT Interval:  406 QTC Calculation: 448 R Axis:   -56 Text Interpretation: Sinus rhythm RBBB and LAFB Left ventricular hypertrophy ST elevation, consider inferior injury Confirmed by Quintella Reichert [phone]) on 08/26/2020 10:06:03 PM   Radiology DG Chest 2 View  Result Date: 08/26/2020 CLINICAL DATA:  Sternal chest pain. EXAM: CHEST - 2 VIEW COMPARISON:  July 03, 2014 FINDINGS: The heart size and mediastinal contours are within normal limits. Mild calcification of the aortic arch is seen. Both lungs are clear. There is a moderate-sized hiatal hernia. The visualized skeletal structures are unremarkable. IMPRESSION: 1. No active cardiopulmonary disease. 2. Moderate-sized hiatal hernia. Electronically Signed   By: Virgina Norfolk M.D.   On: 08/26/2020 22:33   ECHOCARDIOGRAM COMPLETE  Result Date: 08/26/2020    ECHOCARDIOGRAM REPORT   Patient Name:   Heather [name] Date of Exam: 08/26/2020 Medical Rec #:  [id number]      Height:       60.0 in Accession #:    [id number]     Weight:       159.4 lb Date of Birth:  [birth year]      BSA:          1.695 m Patient Age:    76 years       BP:           132/70 mmHg Patient Gender: F              HR:           64 bpm. Exam Location:  [facility] Procedure: 2D Echo, 3D Echo,  Cardiac Doppler, Color Doppler and Strain Analysis                                 MODIFIED REPORT:  This report was modified by Rudean Haskell MD on 08/26/2020 due to Moved                             Finding to Impressions.  Indications:     R06.02 Shortness of breath  History:         Patient has prior history of Echocardiogram examinations, most                  recent 10/24/2011. CAD, DVT, Signs/Symptoms:Syncope; Risk                  Factors:Hypertension and Diabetes. Edema. Anemia.  Sonographer:     Jessee Avers, RDCS Referring Phys:  [id number] WEAVER Diagnosing Phys: Rudean Haskell MD IMPRESSIONS  1. Left ventricular ejection fraction, by estimation, is 55 to 60%. Left ventricular ejection fraction by 3D volume is 61 %. The left ventricle has normal function. The left ventricle has no regional wall motion abnormalities. There is mild concentric left ventricular hypertrophy. Left ventricular diastolic parameters are consistent with Grade II diastolic dysfunction (pseudonormalization). The average left ventricular global longitudinal strain is -21.7 %. The global longitudinal strain is normal.  2. Right ventricular systolic function is normal. The right ventricular size is normal. There is mildly elevated pulmonary artery systolic pressure.  3. The mitral valve is abnormal. Trivial mitral valve regurgitation. No evidence of mitral stenosis.  4. The aortic valve is tricuspid. There is mild calcification of the aortic valve. There is mild thickening of the aortic valve. Aortic valve regurgitation is  not visualized. No aortic stenosis is present.  5. The inferior vena cava is normal in size with greater than 50% respiratory variability, suggesting right atrial pressure of 3 mmHg.  6. Mechanism of mild tricuspid regurgitation suggestive of prolapse. In the setting eccentric jet, study may underestimate the severity of the regurgitation.M Comparison(s): A prior study was performed on 10/24/2011. Prior  images reviewed side by side. Similar ventricular function from prior stress echo. FINDINGS  Left Ventricle: Left ventricular ejection fraction, by estimation, is 55 to 60%. Left ventricular ejection fraction by 3D volume is 61 %. The left ventricle has normal function. The left ventricle has no regional wall motion abnormalities. The average left ventricular global longitudinal strain is -21.7 %. The global longitudinal strain is normal. The left ventricular internal cavity size was small. There is mild concentric left ventricular hypertrophy. Left ventricular diastolic parameters are consistent with Grade II diastolic dysfunction (pseudonormalization). Right Ventricle: The right ventricular size is normal. No increase in right ventricular wall thickness. Right ventricular systolic function is normal. There is mildly elevated pulmonary artery systolic pressure. The tricuspid regurgitant velocity is 3.02  m/s, and with an assumed right atrial pressure of 3 mmHg, the estimated right ventricular systolic pressure is 02.7 mmHg. Left Atrium: Left atrial size was normal in size. Right Atrium: Right atrial size was normal in size. Pericardium: There is no evidence of pericardial effusion. Mitral Valve: The mitral valve is abnormal. Mild mitral annular calcification. Trivial mitral valve regurgitation. No evidence of mitral valve stenosis. Tricuspid Valve: Mechanism of regurgitation suggestive of prolapse. In the setting eccentric jet, study may underestimate the severity of the regurgitation. The tricuspid valve is grossly normal. Tricuspid valve regurgitation is mild. Aortic Valve: The aortic valve is tricuspid. There is mild calcification of the aortic valve. There is mild thickening of the aortic valve. Aortic valve regurgitation is not visualized. No aortic stenosis is present. Pulmonic Valve: The pulmonic valve was grossly normal. Pulmonic valve regurgitation is mild. No evidence of pulmonic stenosis. Aorta: The  aortic root and ascending aorta are structurally normal, with no evidence of dilitation. Venous: The inferior vena cava is normal in size with greater than 50% respiratory variability, suggesting right atrial pressure of 3 mmHg. IAS/Shunts: The atrial septum is grossly normal.  LEFT VENTRICLE PLAX 2D LVIDd:         3.70 cm         Diastology LVIDs:         2.40 cm         LV e' medial:    5.03 cm/s LV PW:         1.20 cm         LV E/e' medial:  17.2 LV IVS:        1.30 cm         LV e' lateral:   9.00 cm/s LVOT diam:     2.00 cm         LV E/e' lateral: 9.6 LV SV:         87 LV SV Index:   51              2D LVOT Area:     3.14 cm        Longitudinal                                Strain  2D Strain GLS  -22.3 %                                (A2C):                                2D Strain GLS  -21.9 %                                (A3C):                                2D Strain GLS  -20.9 %                                (A4C):                                2D Strain GLS  -21.7 %                                Avg:                                 3D Volume EF                                LV 3D EF:    Left                                             ventricular                                             ejection                                             fraction by                                             3D volume                                             is 61 %.                                 3D Volume EF:  3D EF:        61 %                                LV EDV:       116 ml                                LV ESV:       5 ml                                LV SV:        71 ml RIGHT VENTRICLE RV Basal diam:  3.40 cm RV S prime:     10.50 cm/s TAPSE (M-mode): 2.4 cm RVSP:           39.5 mmHg LEFT ATRIUM             Index       RIGHT ATRIUM           Index LA diam:        4.20 cm 2.48 cm/m  RA Pressure: 3.00 mmHg LA Vol (A2C):   35.2 ml 20.77  ml/m RA Area:     7.89 cm LA Vol (A4C):   35.6 ml 21.00 ml/m RA Volume:   13.00 ml  7.67 ml/m LA Biplane Vol: 35.4 ml 20.88 ml/m  AORTIC VALVE LVOT Vmax:   111.00 cm/s LVOT Vmean:  65.700 cm/s LVOT VTI:    0.277 m  AORTA Ao Root diam: 2.90 cm Ao Asc diam:  3.40 cm MITRAL VALVE               TRICUSPID VALVE                            TR Peak grad:   36.5 mmHg                            TR Vmax:        302.00 cm/s MV E velocity: 86.40 cm/s  Estimated RAP:  3.00 mmHg MV A velocity: 83.30 cm/s  RVSP:           39.5 mmHg MV E/A ratio:  1.04                            SHUNTS                            Systemic VTI:  0.28 m                            Systemic Diam: 2.00 cm Rudean Haskell MD Electronically signed by Rudean Haskell MD Signature Date/Time: 08/26/2020/2:30:31 PM    Final (Updated)     Procedures Procedures   Medications Ordered in ED Medications - No data to display  ED Course  I have reviewed the triage vital signs and the nursing notes.  Pertinent labs & imaging results that were available during my care of the patient were reviewed by me and considered in my medical decision making (see chart for details).    MDM Rules/Calculators/A&P  Patient presents today with 3 weeks of 'pounding' in her chest and < 1day of midsternal chest pain.  Hx of NSTEMI in 2006 w/ stent placement but otherwise no significant cardiac history.  Had an echo performed today due to these symptoms and this was read as normal heart function w/o valve disease. EKG today showed RBBB and LAFB which was also present on 08/19/20 and 10/2019. First troponin negative.  CXR was normal except for pre-existing hiatal hernia.  If second trop returns negative pt can likely be safely discharged with close followup.  May need holter monitor at some point for evaluation of underlying arrythmia. Pt signed out to next shift provider.   Final Clinical Impression(s) / ED Diagnoses Final diagnoses:   Chest pain, unspecified type    Rx / DC Orders ED Discharge Orders    None       Benay Pike, MD 08/27/20 Berniece Salines    Quintella Reichert, MD 08/27/20 1241

## 2020-08-26 NOTE — ED Triage Notes (Signed)
Pt comes from home via [facility] for CP that started about 8pm, central, nonradiating. PTA 324 ASA

## 2020-08-27 ENCOUNTER — Telehealth: Payer: Self-pay | Admitting: Physician Assistant

## 2020-08-27 ENCOUNTER — Emergency Department (HOSPITAL_COMMUNITY): Payer: Medicare HMO

## 2020-08-27 DIAGNOSIS — K449 Diaphragmatic hernia without obstruction or gangrene: Secondary | ICD-10-CM | POA: Diagnosis not present

## 2020-08-27 DIAGNOSIS — J9811 Atelectasis: Secondary | ICD-10-CM | POA: Diagnosis not present

## 2020-08-27 DIAGNOSIS — R7989 Other specified abnormal findings of blood chemistry: Secondary | ICD-10-CM | POA: Diagnosis not present

## 2020-08-27 LAB — D-DIMER, QUANTITATIVE: D-Dimer, Quant: 2.67 ug{FEU}/mL — ABNORMAL HIGH (ref 0.00–0.50)

## 2020-08-27 LAB — TROPONIN I (HIGH SENSITIVITY): Troponin I (High Sensitivity): 7 ng/L (ref <18)

## 2020-08-27 MED ORDER — IOHEXOL 350 MG/ML SOLN
100.0000 mL | Freq: Once | INTRAVENOUS | Status: AC | PRN
  Administered 2020-08-27: 100 mL via INTRAVENOUS

## 2020-08-27 NOTE — Telephone Encounter (Signed)
   Pt has had DOE, SOB, palpitations, and chest pain.   She is also concerned that she has been more tired than usual. She has to stop vacuuming or doing other house work sooner than usual. This is a fairly recent feeling.  She had atelectasis on her CXR, family is concerned about this.  BP today is 149/104.  She went to the ER yesterday for these sx. Trop normal, d-dimer elevated but CT ok, CBC OK, BUN/Cr above normal. Labs are below.  SR on ECG, RBBB is old.   Plan:  Increase omeprazole to 40 mg qd to take GERD out of the mix. Take Tylenol as needed for the chest pain.  Take deep breaths periodically, not often enough to get dizzy.  This will help resolve the atelectasis. No more [place] chocolate cake. Call 911 and come to the ER if heart rate is sustained above 100 at rest. Follow blood pressure and heart rate on home blood pressure machine and report range.  If blood pressure remains high, may need to increase amlodipine to 10 mg daily. Patient reports that initial blood pressure in the emergency room was greater than 664 systolic, but improved to normal by discharge.  Therefore, will not make any med changes at this time. Patient and daughter understand that anytime there is increased concern, come to the emergency room.  If she comes to the ER again, request to be evaluated by cardiology.  I will forward this note to [name] PA-C for further management.     Lab Results  Component Value Date   WBC 5.1 08/26/2020   HGB 12.9 08/26/2020   HCT 40.3 08/26/2020   MCV 92.6 08/26/2020   PLT 166 08/26/2020   No results for input(s): INR in the last 72 hours.  Recent Labs  Lab 08/26/20 2159  NA 139  K 4.6  CL 105  CO2 25  BUN 24*  CREATININE 1.11*  CALCIUM 9.4  GLUCOSE 211*     Recent Labs  Lab 08/26/20 2159 08/26/20 2338  TROPONINIHS 7 7     Magnesium  Date Value Ref Range Status  08/19/2020 1.7 1.6 - 2.3 mg/dL Final   Lab Results  Component Value Date    CHOL 150 07/14/2020   HDL 62.10 07/14/2020   LDLCALC 65 07/14/2020   LDLDIRECT 73.4 05/20/2014   TRIG 117.0 07/14/2020   CHOLHDL 2 07/14/2020   Lab Results  Component Value Date   TSH 2.07 07/14/2020   Lab Results  Component Value Date   HGBA1C 7.1 (H) 07/14/2020   Rhonda Barrett, PA-C 08/27/2020 4:12 PM

## 2020-08-27 NOTE — Discharge Instructions (Addendum)
You were evaluated in the Emergency Department and after careful evaluation, we did not find any emergent condition requiring admission or further testing in the hospital.  Your exam/testing today was overall reassuring.  Your symptoms may be due to atelectasis.  Please take deep breaths throughout the day and follow-up with your cardiologist.  Please return to the Emergency Department if you experience any worsening of your condition.  Thank you for allowing [place] to be a part of your care.

## 2020-08-27 NOTE — ED Provider Notes (Signed)
  Provider Note MRN:  [id number]  Arrival date & time: 08/27/20    ED Course and Medical Decision Making  Assumed care from Dr. Ralene Bathe at shift change.  Chest pain awaiting second troponin and D-dimer.  Candidate for discharge.  Troponin negative, D-dimer positive, CTA negative for PE, there is some evidence of atelectasis.  Patient continues to be in no acute distress with normal vital signs, still experiencing palpitations however monitor revealing sinus rhythm with rates in the 60s.  Appropriate for discharge with cardiology follow-up.   Procedures  Final Clinical Impressions(s) / ED Diagnoses     ICD-10-CM   1. Chest pain, unspecified type  R07.9     ED Discharge Orders    None        Discharge Instructions     You were evaluated in the Emergency Department and after careful evaluation, we did not find any emergent condition requiring admission or further testing in the hospital.  Your exam/testing today was overall reassuring.  Your symptoms may be due to atelectasis.  Please take deep breaths throughout the day and follow-up with your cardiologist.  Please return to the Emergency Department if you experience any worsening of your condition.  Thank you for allowing [place] to be a part of your care.      Barth Kirks. Sedonia Small, [place] [email or web address]    Maudie Flakes, MD 08/27/20 [phone]

## 2020-08-29 MED ORDER — METOPROLOL SUCCINATE ER 50 MG PO TB24: 75.0000 mg | ORAL_TABLET | Freq: Every day | ORAL | 3 refills | Status: DC

## 2020-08-29 MED ORDER — NITROGLYCERIN 0.4 MG SL SUBL: 0.4000 mg | SUBLINGUAL_TABLET | SUBLINGUAL | 11 refills | Status: DC | PRN

## 2020-08-29 NOTE — Telephone Encounter (Signed)
Triage -  I adjusted her Toprol and sent in a Rx for NTG. Can we check with nuc med and see if there is any way to get her Myoview done sooner? [name], PA-C    08/29/2020 10:25 AM

## 2020-08-29 NOTE — Telephone Encounter (Signed)
Lm for pt to call back to let know change of myoview appt./cy

## 2020-08-29 NOTE — Telephone Encounter (Signed)
See phone note from 2/19.  Spoke with patient by phone today 08/29/2020. [name], PA-C    08/29/2020 10:26 AM

## 2020-08-29 NOTE — Telephone Encounter (Signed)
Spoke with patient by phone today.  She had an episode of palpitations that were rapid associated with substernal chest pain.  Her chest pain has persisted since Saturday evening.  It is mild and not related to exertion.  She had had these symptoms for hours when she went to the ED on 2/19.  Her hsTrop were 7 >>7, which is very reassuring.  She notes today that she feels better and is less exhausted when she does activities.  She still has occasional flutters.  PLAN:  Increase Toprol XL to 75 mg once daily. Rx for prn NTG. Will see if Myoview can be moved up sooner.  If Myoview low risk and palpitations continue, consider event monitor. [name], PA-C    08/29/2020 10:25 AM

## 2020-08-29 NOTE — Telephone Encounter (Signed)
Myoview moved up to 09/06/20 at 7:45 am .

## 2020-08-29 NOTE — Addendum Note (Signed)
Addended byKathlen Mody, Nicki Reaper T on: 08/29/2020 10:26 AM   Modules accepted: Orders

## 2020-08-30 ENCOUNTER — Telehealth (HOSPITAL_COMMUNITY): Payer: Self-pay | Admitting: Physician Assistant

## 2020-08-30 ENCOUNTER — Telehealth (HOSPITAL_COMMUNITY): Payer: Self-pay | Admitting: *Deleted

## 2020-08-30 ENCOUNTER — Encounter (HOSPITAL_COMMUNITY): Payer: Self-pay | Admitting: *Deleted

## 2020-08-30 NOTE — Telephone Encounter (Signed)
Patient given detailed instructions per Myocardial Perfusion Study Information Sheet for the test on 08/31/20 at 1030. Patient notified to arrive 15 minutes early and that it is imperative to arrive on time for appointment to keep from having the test rescheduled.  If you need to cancel or reschedule your appointment, please call the office within 24 hours of your appointment. . Patient verbalized understanding.[place] Mychart letter sent

## 2020-08-30 NOTE — Telephone Encounter (Signed)
I called patient to see if she could come for an earlier stress appt for 08/31/20 at 10:30. Per request from MD to move up sooner. I had to leave a voicemail @ 9:53.

## 2020-08-31 ENCOUNTER — Other Ambulatory Visit: Payer: Self-pay

## 2020-08-31 ENCOUNTER — Ambulatory Visit (HOSPITAL_COMMUNITY): Payer: Medicare HMO | Attending: Internal Medicine

## 2020-08-31 DIAGNOSIS — I2510 Atherosclerotic heart disease of native coronary artery without angina pectoris: Secondary | ICD-10-CM | POA: Diagnosis not present

## 2020-08-31 DIAGNOSIS — R0602 Shortness of breath: Secondary | ICD-10-CM | POA: Insufficient documentation

## 2020-08-31 LAB — MYOCARDIAL PERFUSION IMAGING
LV dias vol: 59 mL (ref 46–106)
LV sys vol: 19 mL
Peak HR: 82 {beats}/min
Rest HR: 57 {beats}/min
SDS: 4
SRS: 3
SSS: 7
TID: 0.9

## 2020-08-31 MED ORDER — REGADENOSON 0.4 MG/5ML IV SOLN
0.4000 mg | Freq: Once | INTRAVENOUS | Status: AC
Start: 2020-08-31 — End: 2020-08-31
  Administered 2020-08-31: 0.4 mg via INTRAVENOUS

## 2020-08-31 MED ORDER — TECHNETIUM TC 99M TETROFOSMIN IV KIT
31.8000 | PACK | Freq: Once | INTRAVENOUS | Status: AC | PRN
  Administered 2020-08-31: 31.8 via INTRAVENOUS
  Filled 2020-08-31: qty 32

## 2020-08-31 MED ORDER — TECHNETIUM TC 99M TETROFOSMIN IV KIT
10.2000 | PACK | Freq: Once | INTRAVENOUS | Status: AC | PRN
  Administered 2020-08-31: 10.2 via INTRAVENOUS
  Filled 2020-08-31: qty 11

## 2020-08-31 NOTE — Telephone Encounter (Signed)
Pt's myoview moved up again and pt is having 08/31/20 .Adonis Housekeeper

## 2020-09-01 ENCOUNTER — Encounter: Payer: Self-pay | Admitting: Physician Assistant

## 2020-09-01 ENCOUNTER — Other Ambulatory Visit: Payer: Self-pay | Admitting: Cardiovascular Disease

## 2020-09-01 ENCOUNTER — Telehealth: Payer: Self-pay | Admitting: Physician Assistant

## 2020-09-01 DIAGNOSIS — R002 Palpitations: Secondary | ICD-10-CM

## 2020-09-01 NOTE — Telephone Encounter (Signed)
   Pt returning Heather [name]'s call for her stress test result

## 2020-09-01 NOTE — Telephone Encounter (Signed)
Heather Shi, PA-C sent to Heather [name] St Triage Please call the patient. Her stress test is normal.   She has complained of palpitations with some of her symptoms. I increased her Metoprolol recently.   PLAN:  - Ask pt if she has had any further palpitations >> If yes, arrange 30 day event monitor  - F/u as planned  [name], PA-C   09/01/2020 8:36 AM

## 2020-09-01 NOTE — Telephone Encounter (Signed)
Outreach made to Pt.  Pt advised of normal stress test results.  Per Pt she is still having symptomatic palpitations-no improvement on increased dose of Toprol.    Advised per [name] would like Pt to wear a 30 day heart monitor.  Pt states she will need assistance placing monitor.  Will schedule Pt to come in for 30 day monitor placement.

## 2020-09-01 NOTE — Telephone Encounter (Signed)
Patient schedule to have Cardiac event monitor applied in office 09/05/2020 at 10:00AM.

## 2020-09-05 ENCOUNTER — Other Ambulatory Visit: Payer: Self-pay

## 2020-09-05 ENCOUNTER — Ambulatory Visit (INDEPENDENT_AMBULATORY_CARE_PROVIDER_SITE_OTHER): Payer: Medicare HMO

## 2020-09-05 ENCOUNTER — Encounter: Payer: Self-pay | Admitting: *Deleted

## 2020-09-05 ENCOUNTER — Telehealth: Payer: Self-pay | Admitting: *Deleted

## 2020-09-05 ENCOUNTER — Ambulatory Visit
Admission: EM | Admit: 2020-09-05 | Discharge: 2020-09-05 | Disposition: A | Discharge status: Home / Self Care | Payer: Medicare HMO | Attending: Emergency Medicine | Admitting: Emergency Medicine

## 2020-09-05 ENCOUNTER — Ambulatory Visit: Payer: Medicare HMO

## 2020-09-05 ENCOUNTER — Telehealth: Payer: Self-pay | Admitting: Family Medicine

## 2020-09-05 DIAGNOSIS — R002 Palpitations: Secondary | ICD-10-CM | POA: Diagnosis not present

## 2020-09-05 DIAGNOSIS — J22 Unspecified acute lower respiratory infection: Secondary | ICD-10-CM

## 2020-09-05 DIAGNOSIS — K449 Diaphragmatic hernia without obstruction or gangrene: Secondary | ICD-10-CM | POA: Diagnosis not present

## 2020-09-05 DIAGNOSIS — R059 Cough, unspecified: Secondary | ICD-10-CM

## 2020-09-05 DIAGNOSIS — R062 Wheezing: Secondary | ICD-10-CM

## 2020-09-05 MED ORDER — AZITHROMYCIN 250 MG PO TABS: 250.0000 mg | ORAL_TABLET | Freq: Every day | ORAL | 0 refills | Status: DC

## 2020-09-05 MED ORDER — BENZONATATE 200 MG PO CAPS: 200.0000 mg | ORAL_CAPSULE | Freq: Three times a day (TID) | ORAL | 0 refills | Status: AC | PRN

## 2020-09-05 MED ORDER — ALBUTEROL SULFATE HFA 108 (90 BASE) MCG/ACT IN AERS: 1.0000 | INHALATION_SPRAY | Freq: Four times a day (QID) | RESPIRATORY_TRACT | 0 refills | Status: DC | PRN

## 2020-09-05 NOTE — ED Triage Notes (Signed)
PT reports last wed. She had reflux a aspirated fluid. Pt reports she has had reflux for a long time . Pt reports a difficult time with wheezing since last Wed.

## 2020-09-05 NOTE — Telephone Encounter (Signed)
She should be seen for evaluation.  I suggest she see primary care or urgent care today. [name], PA-C    09/05/2020 11:57 AM

## 2020-09-05 NOTE — Telephone Encounter (Signed)
Pt went to urgent care for problem

## 2020-09-05 NOTE — Telephone Encounter (Signed)
Heather [name] had a nuclear study done last Wednesday.  She states, the camera was pushing on her chest.  She has reflux, which, was severe that evening, and she thinks she may have aspirated.  Since her test, she has had a productive cough with thick white and yellow mucus.  She can hear herself wheezing.  Please advise.

## 2020-09-05 NOTE — Telephone Encounter (Signed)
Patient states she had a study done last week. Since then, she has had a productive cough, wheezing. Patient states she was advised by a Kathlen Mody PA to get evaluated by her pcp or urgent care. I explained to patient, we have no available appt today. Patient is a little upset.

## 2020-09-05 NOTE — Discharge Instructions (Addendum)
Begin azithromycin-2 tablets today, 1 tablet for the following 4 days Tessalon/benzonatate every 8 hours for cough Albuterol inhaler as needed Follow-up with cardiology as planned Return here if symptoms changing or worsening

## 2020-09-05 NOTE — Telephone Encounter (Signed)
Called pt and left detailed message (ok per DPR) to get evaluated by PCP or UC today.  Advised to call back if any questions.

## 2020-09-06 ENCOUNTER — Other Ambulatory Visit: Payer: Self-pay | Admitting: Family Medicine

## 2020-09-06 ENCOUNTER — Encounter (HOSPITAL_COMMUNITY): Payer: Medicare HMO

## 2020-09-06 ENCOUNTER — Ambulatory Visit (INDEPENDENT_AMBULATORY_CARE_PROVIDER_SITE_OTHER): Payer: Medicare HMO | Admitting: Pharmacist

## 2020-09-06 DIAGNOSIS — G2581 Restless legs syndrome: Secondary | ICD-10-CM

## 2020-09-06 DIAGNOSIS — E1169 Type 2 diabetes mellitus with other specified complication: Secondary | ICD-10-CM

## 2020-09-06 DIAGNOSIS — E785 Hyperlipidemia, unspecified: Secondary | ICD-10-CM | POA: Diagnosis not present

## 2020-09-06 DIAGNOSIS — I10 Essential (primary) hypertension: Secondary | ICD-10-CM | POA: Diagnosis not present

## 2020-09-06 DIAGNOSIS — E1121 Type 2 diabetes mellitus with diabetic nephropathy: Secondary | ICD-10-CM

## 2020-09-06 MED ORDER — AMLODIPINE BESYLATE 10 MG PO TABS: 10.0000 mg | ORAL_TABLET | Freq: Every day | ORAL | 1 refills | Status: DC

## 2020-09-06 NOTE — Patient Instructions (Addendum)
Visit Information  Goals Addressed            This Visit's Progress   . Track and Manage My Blood Pressure-Hypertension       Timeframe:  Long-Range Goal Priority:  High Start Date: 09/06/20                            Expected End Date:  03/09/21                     Follow Up Date 11/05/20   - check blood pressure daily - write blood pressure results in a log or diary  -Contact providers if consistently > 140/90   Why is this important?    You won't feel high blood pressure, but it can still hurt your blood vessels.   High blood pressure can cause heart or kidney problems. It can also cause a stroke.   Making lifestyle changes like losing a little weight or eating less salt will help.   Checking your blood pressure at home and at different times of the day can help to control blood pressure.   If the doctor prescribes medicine remember to take it the way the doctor ordered.   Call the office if you cannot afford the medicine or if there are questions about it.     Notes:       Patient Care Plan: General Pharmacy (Adult)    Problem Identified: Hypertension, Hyperlipidemia/CAD, Diabetes, GERD, RLS   Priority: High  Onset Date: 09/06/2020    Goal: Patient-Specific Goal   Start Date: 09/06/2020  Expected End Date: 03/09/2021  This Visit's Progress: On track  Priority: High  Note:   Current Barriers:  . Unable to achieve control of blood pressure   Pharmacist Clinical Goal(s):  Marland Kitchen Over the next 180 days, patient will achieve adherence to monitoring guidelines and medication adherence to achieve therapeutic efficacy . achieve control of blood pressure as evidenced by home monitoring . adhere to prescribed medication regimen as evidenced by fill dates . contact provider office for questions/concerns as evidenced notation of same in electronic health record through collaboration with PharmD and provider.    Interventions: . 1:1 collaboration with Mosie Lukes, MD  regarding development and update of comprehensive plan of care as evidenced by provider attestation and co-signature . Inter-disciplinary care team collaboration (see longitudinal plan of care) . Comprehensive medication review performed; medication list updated in electronic medical record  Hypertension (BP goal <130/80) -Uncontrolled -Current treatment:  Metoprolol Succinate 50mg  1.5 tablets daily hs  Amlodipine 5mg  daily AM - will take extra tablet "if needed"  Losartan 100mg  daily  Isosorbide ER 60mg  daily -Medications previously tried: triamterene-hctz (listed in D/C meds. No apparent reason for D/C)  -Current home readings: 160/86, 153/76, 157/87, 161/90, 140/70 -Current dietary habits: see DM -Denies hypotensive/hypertensive symptoms -Educated on BP goals and benefits of medications for prevention of heart attack, stroke and kidney damage; Importance of home blood pressure monitoring; -Counseled to monitor BP at home daily, document, and provide log at future appointments -Recommended patient to continue to check blood pressure and document Collaborated with PCP and Cardiology on recommendation to titrate amlodipine to 10mg  daily as home monitoring shows BP consistently elevated. - Will follow up with patient once plan is in place.  Hyperlipidemia/CAD: (LDL goal < 70) -Controlled -Current treatment:  Simvastatin 20mg  daily at bedtime  Aspirin 81mg  -Medications previously tried: none noted  -Current  dietary patterns: see DM -Current exercise habits: minimal -Educated on Cholesterol goals;  Benefits of statin for ASCVD risk reduction; -Recommended to continue current medication Assessed adherence, patient takes daily.  -LDL at goal  Diabetes (A1c goal <7.5) -Controlled -Current medications: . None -Medications previously tried: metformin?  -Current home glucose readings . She does not check at home -Denies hypoglycemic/hyperglycemic symptoms -Current meal  patterns:  B - 2 scrambled eggs with cheese, bacon bits, crossiant; cereal with fruit L - Usually skips (pb crackers) Dinner - Salad, leftover chicken breast Drinks - coffee w/ cream and splenda -Current exercise: occasional biking on her porch -Educated onA1c and blood sugar goals; -Counseled to check feet daily and get yearly eye exams -Recommended to continue current management strategy.  RLS (Goal: control symptoms) -Controlled -Current treatment   Pramipexole 0.25mg  twice daily -Medications previously tried: none noted -Patient noted that her swelling has improved, no longer has pain in R calf -Discussed when to report to ER given her history of DVT -Recommended to continue current medication   Patient Goals/Self-Care Activities . Over the next 180 days, patient will:  - take medications as prescribed focus on medication adherence by pill count check blood pressure daily, document, and provide at future appointments  Follow Up Plan: The care management team will reach out to the patient again over the next 180 days.         The patient verbalized understanding of instructions, educational materials, and care plan provided today and agreed to receive a mailed copy of patient instructions, educational materials, and care plan.  Telephone follow up appointment with pharmacy team member scheduled for: 3 months  Heather [name], [facility]  How to Take Your Blood Pressure Blood pressure is a measurement of how strongly your blood is pressing against the walls of your arteries. Arteries are blood vessels that carry blood from your heart throughout your body. Your health care provider takes your blood pressure at each office visit. You can also take your own blood pressure at home with a blood pressure monitor. You may need to take your own blood pressure to:  Confirm a diagnosis of high blood pressure (hypertension).  Monitor your blood pressure over time.  Make sure your  blood pressure medicine is working. Supplies needed:  Blood pressure monitor.  Dining room chair to sit in.  Table or desk.  Small notebook and pencil or pen. How to prepare To get the most accurate reading, avoid the following for 30 minutes before you check your blood pressure:  Drinking caffeine.  Drinking alcohol.  Eating.  Smoking.  Exercising. Five minutes before you check your blood pressure:  Use the bathroom and urinate so that you have an empty bladder.  Sit quietly in a dining room chair. Do not sit in a soft couch or an armchair. Do not talk. How to take your blood pressure To check your blood pressure, follow the instructions in the manual that came with your blood pressure monitor. If you have a digital blood pressure monitor, the instructions may be as follows: 1. Sit up straight in a chair. 2. Place your feet on the floor. Do not cross your ankles or legs. 3. Rest your left arm at the level of your heart on a table or desk or on the arm of a chair. 4. Pull up your shirt sleeve. 5. Wrap the blood pressure cuff around the upper part of your left arm, 1 inch (2.5 cm) above your elbow. It is best  to wrap the cuff around bare skin. 6. Fit the cuff snugly around your arm. You should be able to place only one finger between the cuff and your arm. 7. Position the cord so that it rests in the bend of your elbow. 8. Press the power button. 9. Sit quietly while the cuff inflates and deflates. 10. Read the digital reading on the monitor screen and write the numbers down (record them) in a notebook. 11. Wait 2-3 minutes, then repeat the steps, starting at step 1.   What does my blood pressure reading mean? A blood pressure reading consists of a higher number over a lower number. Ideally, your blood pressure should be below 120/80. The first ("top") number is called the systolic pressure. It is a measure of the pressure in your arteries as your heart beats. The second  ("bottom") number is called the diastolic pressure. It is a measure of the pressure in your arteries as the heart relaxes. Blood pressure is classified into five stages. The following are the stages for adults who do not have a short-term serious illness or a chronic condition. Systolic pressure and diastolic pressure are measured in a unit called mm Hg (millimeters of mercury).  Normal  Systolic pressure: below 867.  Diastolic pressure: below 80. Elevated  Systolic pressure: 619-509.  Diastolic pressure: below 80. Hypertension stage 1  Systolic pressure: 326-712.  Diastolic pressure: 45-80. Hypertension stage 2  Systolic pressure: 998 or above.  Diastolic pressure: 90 or above. You can have elevated blood pressure or hypertension even if only the systolic or only the diastolic number in your reading is higher than normal. Follow these instructions at home:  Check your blood pressure as often as recommended by your health care provider.  Check your blood pressure at the same time every day.  Take your monitor to the next appointment with your health care provider to make sure that: ? You are using it correctly. ? It provides accurate readings.  Be sure you understand what your goal blood pressure numbers are.  Tell your health care provider if you are having any side effects from blood pressure medicine.  Keep all follow-up visits as told by your health care provider. This is important. General tips  Your health care provider can suggest a reliable monitor that will meet your needs. There are several types of home blood pressure monitors.  Choose a monitor that has an arm cuff. Do not choose a monitor that measures your blood pressure from your wrist or finger.  Choose a cuff that wraps snugly around your upper arm. You should be able to fit only one finger between your arm and the cuff.  You can buy a blood pressure monitor at most drugstores or online. Where to find  more information American Heart Association: www.heart.org Contact a health care provider if:  Your blood pressure is consistently high. Get help right away if:  Your systolic blood pressure is higher than 180.  Your diastolic blood pressure is higher than 120. Summary  Blood pressure is a measurement of how strongly your blood is pressing against the walls of your arteries.  A blood pressure reading consists of a higher number over a lower number. Ideally, your blood pressure should be below 120/80.  Check your blood pressure at the same time every day.  Avoid caffeine, alcohol, smoking, and exercise for 30 minutes prior to checking your blood pressure. These agents can affect the accuracy of the blood pressure reading. This information  is not intended to replace advice given to you by your health care provider. Make sure you discuss any questions you have with your health care provider. Document Revised: 06/19/2019 Document Reviewed: 06/19/2019 Elsevier Patient Education  2021 [employer].

## 2020-09-06 NOTE — Progress Notes (Signed)
Chronic Care Management Pharmacy Note  09/06/2020 Name:  Heather [name] MRN:  [id number] DOB:  [birth year]  Subjective: Heather [name] is an 86 y.o. year old female who is a primary patient of Mosie Lukes, MD.  The CCM team was consulted for assistance with disease management and care coordination needs.    Engaged with patient by telephone for follow up visit in response to provider referral for pharmacy case management and/or care coordination services.   Consent to Services:  The patient was given the following information about Chronic Care Management services today, agreed to services, and gave verbal consent: 1. CCM service includes personalized support from designated clinical staff supervised by the primary care provider, including individualized plan of care and coordination with other care providers 2. 24/7 contact phone numbers for assistance for urgent and routine care needs. 3. Service will only be billed when office clinical staff spend 20 minutes or more in a month to coordinate care. 4. Only one practitioner may furnish and bill the service in a calendar month. 5.The patient may stop CCM services at any time (effective at the end of the month) by phone call to the office staff. 6. The patient will be responsible for cost sharing (co-pay) of up to 20% of the service fee (after annual deductible is met). Patient agreed to services and consent obtained.  Patient Care Team: Mosie Lukes, MD as PCP - General (Family Medicine) [name], MD as PCP - Cardiology (Cardiology) Idelle Leech, [place] (Optometry) Janyth Contes, MD as Consulting Physician (Obstetrics and Gynecology) Trula Slade, DPM as Consulting Physician (Podiatry) Day, Melvenia Beam, [facility] (Inactive) as Pharmacist (Pharmacist) Sharmon Revere as Physician Assistant (Cardiology)  Recent office visits: 10/13/19: Visit w/ Dr. [name] -  No med changes noted. Medically cleared for pelvic prolapse  surgery.  Recent consult visits: 08/19/20 (weaver, Cardio) - isosorbide increased to 62m daily due to palpitations  Hospital visits: 09/05/2020 (ED) - patient presented to ED for cough, sent home with Albuterol, Z-pak, and Tessalon  08/26/20 (ED) - chest pain, reports palpitations that turned into chest discomfort, Toprol was increased, stress test came back normal Objective:  Lab Results  Component Value Date   CREATININE 1.11 (H) 08/26/2020   BUN 24 (H) 08/26/2020   GFR 56.01 (L) 07/14/2020   GFRNONAA 49 (L) 08/26/2020   GFRAA 57 (L) 08/19/2020   NA 139 08/26/2020   K 4.6 08/26/2020   CALCIUM 9.4 08/26/2020   CO2 25 08/26/2020    Lab Results  Component Value Date/Time   HGBA1C 7.1 (H) 07/14/2020 09:21 AM   HGBA1C 7.2 (H) 10/13/2019 09:09 AM   GFR 56.01 (L) 07/14/2020 09:21 AM   GFR 54.17 (L) 12/15/2019 03:36 PM   MICROALBUR <0.7 05/30/2018 09:45 AM    Last diabetic Eye exam:  Lab Results  Component Value Date/Time   HMDIABEYEEXA No Retinopathy 08/08/2020 12:00 AM    Last diabetic Foot exam: No results found for: HMDIABFOOTEX   Lab Results  Component Value Date   CHOL 150 07/14/2020   HDL 62.10 07/14/2020   LDLCALC 65 07/14/2020   LDLDIRECT 73.4 05/20/2014   TRIG 117.0 07/14/2020   CHOLHDL 2 07/14/2020    Hepatic Function Latest Ref Rng & Units 07/14/2020 04/18/2020 12/18/2019  Total Protein 6.0 - 8.3 g/dL 6.2 6.6 6.2(L)  Albumin 3.5 - 5.2 g/dL 4.3 4.1 4.0  AST 0 - 37 U/L '16 24 20  ' ALT 0 - 35 U/L 15 18 20  Alk Phosphatase 39 - 117 U/L 89 74 75  Total Bilirubin 0.2 - 1.2 mg/dL 0.5 1.0 0.9  Bilirubin, Direct 0.0 - 0.3 mg/dL - - -    Lab Results  Component Value Date/Time   TSH 2.07 07/14/2020 09:21 AM   TSH 3.17 10/13/2019 09:09 AM    CBC Latest Ref Rng & Units 08/26/2020 08/22/2020 08/19/2020  WBC 4.0 - 10.5 K/uL 5.1 6.5 CANCELED  Hemoglobin 12.0 - 15.0 g/dL 12.9 13.0 CANCELED  Hematocrit 36.0 - 46.0 % 40.3 39.2 CANCELED  Platelets 150 - 400 K/uL 166  144(L) CANCELED    No results found for: VD25OH  Clinical ASCVD: No  The ASCVD Risk score Mikey Bussing DC Jr., et al., 2013) failed to calculate for the following reasons:   The 2013 ASCVD risk score is only valid for ages 24 to 35    Depression screen PHQ 2/9 07/28/2019 07/21/2018 06/18/2016  Decreased Interest 0 0 0  Down, Depressed, Hopeless 1 1 0  PHQ - 2 Score 1 1 0  Altered sleeping - - -  Tired, decreased energy - - -  Change in appetite - - -  Feeling bad or failure about yourself  - - -  Trouble concentrating - - -  Moving slowly or fidgety/restless - - -  Suicidal thoughts - - -  PHQ-9 Score - - -  Difficult doing work/chores - - -  Some recent data might be hidden      Social History   Tobacco Use  Smoking Status Never Smoker  Smokeless Tobacco Never Used   BP Readings from Last 3 Encounters:  09/05/20 (!) 186/44  08/27/20 (!) 152/64  08/19/20 132/70   Pulse Readings from Last 3 Encounters:  09/05/20 64  08/27/20 (!) 57  08/19/20 69   Wt Readings from Last 3 Encounters:  08/31/20 159 lb (72.1 kg)  08/19/20 159 lb 6.4 oz (72.3 kg)  07/14/20 163 lb 12.8 oz (74.3 kg)    Assessment/Interventions: Review of patient past medical history, allergies, medications, health status, including review of consultants reports, laboratory and other test data, was performed as part of comprehensive evaluation and provision of chronic care management services.   SDOH:  (Social Determinants of Health) assessments and interventions performed: No   CCM Care Plan  Allergies  Allergen Reactions  . Baclofen Other (See Comments)    Hyperactivity   . Nitrofurantoin Rash    Medications Reviewed Today    Reviewed by Edythe Clarity, [facility] (Pharmacist) on 09/06/20 at 1540  Med List Status: <None>  Medication Order Taking? Sig Documenting Provider Last Dose Status Informant  acetaminophen (TYLENOL) 500 MG tablet [id number] Yes Take 2 tablets (1,000 mg total) by mouth every 6 (six)  hours as needed for mild pain. Bovard-Stuckert, Jeral Fruit, MD Taking Active Self  albuterol (VENTOLIN HFA) 108 (90 Base) MCG/ACT inhaler [id number] Yes Inhale 1-2 puffs into the lungs every 6 (six) hours as needed for wheezing or shortness of breath. Wieters, Curtiss C, PA-C Taking Active   amLODipine (NORVASC) 5 MG tablet [id number] Yes TAKE 1 TABLET EVERY DAY  Patient taking differently: Take 5 mg by mouth daily.   Mosie Lukes, MD Taking Active   Ascorbic Acid (VITAMIN C) 500 MG CHEW [id number] Yes Chew by mouth daily. [provider] Taking Active Self  aspirin EC 81 MG tablet [id number] Yes Take 1 tablet (81 mg total) by mouth daily. [name], MD Taking Active Self  azithromycin [facility]) 250 MG tablet [id number] Yes Take  1 tablet (250 mg total) by mouth daily. Take first 2 tablets together, then 1 every day until finished. Wieters, [place] C, PA-C Taking Active   benzonatate (TESSALON) 200 MG capsule [id number] Yes Take 1 capsule (200 mg total) by mouth 3 (three) times daily as needed for up to 7 days for cough. Wieters, Gal[place]o C, PA-C Taking Active   Blood Glucose Monitoring Suppl (TRUE METRIX AIR GLUCOSE METER) w/Device KIT [id number] Yes USE TO CHECK BLOOD SUGAR ONCE DAILY AND AS NEEDED.  DX CODE E11.9 Mosie Lukes, MD Taking Active Self  Cholecalciferol (VITAMIN D) 50 MCG (2000 UT) CAPS [id number] Yes Take 2,000 Units by mouth daily.  [provider] Taking Active Self  famotidine (PEPCID) 20 MG tablet [id number] Yes Take 20 mg by mouth at bedtime as needed for heartburn or indigestion. [provider] Taking Active Self  glucose blood (TRUE METRIX BLOOD GLUCOSE TEST) test strip [id number] Yes USE TO CHECK BLOOD SUGAR ONCE A DAY OR AS NEEDED.  DX CODE: E11.9 Mosie Lukes, MD Taking Active Self  hyoscyamine (LEVSIN SL) 0.125 MG SL tablet [id number] Yes Place 1 tablet (0.125 mg total) under the tongue every 4 (four) hours as needed.  Patient taking differently: Place  0.125 mg under the tongue every 4 (four) hours as needed for cramping.   Mosie Lukes, MD Taking Active            Med Note Briant Cedar   Fri Aug 19, 2020 12:27 PM)    isosorbide mononitrate (IMDUR) 60 MG 24 hr tablet [id number] Yes Take 1 tablet (60 mg total) by mouth daily. [name] T, PA-C Taking Active Self  losartan (COZAAR) 100 MG tablet [id number] Yes TAKE 1 TABLET EVERY DAY  Patient taking differently: Take 100 mg by mouth daily.   Mosie Lukes, MD Taking Active   magnesium oxide (MAG-OX) 400 MG tablet [id number] Yes Take 400 mg by mouth 2 (two) times daily. [provider] Taking Active Self  metoprolol succinate (TOPROL-XL) 50 MG 24 hr tablet [id number] Yes Take 1.5 tablets (75 mg total) by mouth at bedtime. Take with or immediately following a meal. [name] T, PA-C Taking Active   nitroGLYCERIN (NITROSTAT) 0.4 MG SL tablet [id number] Yes Place 1 tablet (0.4 mg total) under the tongue every 5 (five) minutes as needed for chest pain. [name] T, PA-C Taking Active   omeprazole (PRILOSEC) 20 MG capsule [id number] Yes Take 1 capsule (20 mg total) by mouth daily as needed.  Patient taking differently: Take 20 mg by mouth at bedtime.   Mosie Lukes, MD Taking Active   ondansetron (ZOFRAN ODT) 4 MG disintegrating tablet [id number] Yes Take 1 tablet (4 mg total) by mouth every 6 (six) hours as needed for nausea or vomiting. Yetta Flock, MD Taking Active Self  OVER THE COUNTER MEDICATION [id number] Yes Zinc q day [provider] Taking Active Self  potassium chloride SA (KLOR-CON) 20 MEQ tablet [id number] Yes Take 1 tablet (20 mEq total) by mouth daily.  Patient taking differently: Take 20 mEq by mouth at bedtime.   [name], MD Taking Active   pramipexole (MIRAPEX) 0.25 MG tablet [id number] Yes TAKE 1 TABLET TWICE DAILY  Patient taking differently: Take 0.25 mg by mouth 2 times daily at 12 noon and 4 pm.   Mosie Lukes, MD Taking Active    simvastatin (ZOCOR) 20 MG tablet [id number] Yes TAKE 1 TABLET AT BEDTIME Liliane Shi, PA-C Taking Active  traMADol (ULTRAM) 50 MG tablet [id number] Yes Take 1-2 tablets (50-100 mg total) by mouth every 6 (six) hours as needed for moderate pain. Janyth Contes, MD Taking Active Self  TRUEplus Lancets 33G [place] [id number] Yes USE TO CHECK BLOOD SUGAR ONCE DAILY AND AS NEEDED.  DX CODE: E11.9 Mosie Lukes, MD Taking Active Self          Patient Active Problem List   Diagnosis Date Noted  . Low back pain radiating to right leg 07/14/2020  . 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 Prolapse of female pelvic organs 12/22/2019  . Pelvic relaxation 12/22/2019  . Right hip pain 10/13/2019  . Left foot pain 03/08/2019  . Pelvic prolapse 03/08/2019  . Pain of breast 01/29/2019  . Overactive bladder 01/29/2019  . Educated about COVID-19 virus infection 12/02/2018  . Hyperlipidemia associated with type 2 diabetes mellitus ([place]) 05/27/2017  . Right shoulder pain 04/29/2015  . Diarrhea 04/17/2015  . Headache 04/17/2015  . Multinodular goiter 01/17/2015  . Peroneal DVT (deep venous thrombosis) ([place]) 12/22/2014  . S/P total knee arthroplasty 11/29/2014  . Arthritis of knee, degenerative 11/19/2014  . Right knee pain 11/19/2014  . Neck pain 10/29/2014  . Insomnia 02/05/2014  . Preventative health care 11/22/2013  . RLS (restless legs syndrome) 10/04/2013  . Lower urinary tract infectious disease 10/04/2013  . Cataracts, bilateral 04/02/2013  . Leg swelling 01/17/2012  . Hypokalemia 01/05/2012  . Anemia 01/05/2012  . Depression with anxiety 01/05/2012  . Postoperative anemia due to acute blood loss 11/15/2011  . 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 Pre-syncope 10/13/2011  . DM (diabetes mellitus) ([place]) 10/06/2011  . Pulmonary nodule 10/06/2011  . Epigastric pain 10/06/2011  . DJD (degenerative joint disease) of knee 09/18/2011  . It band syndrome, right  08/28/2011  . Renal insufficiency 08/28/2011  . CORONARY ATHEROSCLEROSIS NATIVE CORONARY ARTERY 03/07/2010  . Coronary atherosclerosis 01/18/2009  . FATIGUE 01/18/2009  . PERSISTENT DISORDER INITIATING/MAINTAINING SLEEP 09/09/2008  . DERMATITIS 10/16/2007  . PERIPHERAL EDEMA 10/16/2007  . PALPITATIONS 10/16/2007  . Essential hypertension 04/14/2007  . GERD 04/14/2007  . TRANSIENT ISCHEMIC ATTACK, HX OF 04/14/2007    Immunization History  Administered Date(s) Administered  . Influenza Whole 06/08/2005, 06/29/2010  . Influenza, High Dose Seasonal PF 04/05/2015, 06/18/2016, 05/27/2017, 05/30/2018  . Influenza, Quadrivalent, Recombinant, Inj, Pf 04/22/2019  . Influenza,inj,Quad PF,6+ Mos 04/30/2014  . Influenza,inj,quad, With Preservative 05/06/2020  . Moderna Sars-Covid-2 Vaccination 07/21/2019, 08/18/2019  . PFIZER(Purple Top)SARS-COV-2 Vaccination 05/06/2020  . Pneumococcal Conjugate-13 11/17/2013  . Pneumococcal Polysaccharide-23 07/09/2000, 05/27/2017  . Tdap 05/24/2015    Conditions to be addressed/monitored:  Hypertension, Hyperlipidemia/CAD, Diabetes, GERD, RLS  Care Plan : General Pharmacy (Adult)  Updates made by Edythe Clarity, RPH since 09/06/2020 12:00 AM    Problem: Hypertension, Hyperlipidemia/CAD, Diabetes, GERD, RLS   Priority: High  Onset Date: 09/06/2020    Goal: Patient-Specific Goal   Start Date: 09/06/2020  Expected End Date: 03/09/2021  This Visit's Progress: On track  Priority: High  Note:   Current Barriers:  . Unable to achieve control of blood pressure   Pharmacist Clinical Goal(s):  Marland Kitchen Over the next 180 days, patient will achieve adherence to monitoring guidelines and medication adherence to achieve therapeutic efficacy . achieve control of blood pressure as evidenced by home monitoring . adhere to prescribed medication regimen as evidenced by fill dates . contact provider office for questions/concerns as evidenced notation of same in electronic  health record through collaboration with PharmD  and provider.    Interventions: . 1:1 collaboration with Mosie Lukes, MD regarding development and update of comprehensive plan of care as evidenced by provider attestation and co-signature . Inter-disciplinary care team collaboration (see longitudinal plan of care) . Comprehensive medication review performed; medication list updated in electronic medical record  Hypertension (BP goal <130/80) -Uncontrolled -Current treatment:  Metoprolol Succinate 55m 1.5 tablets daily hs  Amlodipine 573mdaily AM - will take extra tablet "if needed"  Losartan 10054maily  Isosorbide ER 45m43mily -Medications previously tried: triamterene-hctz (listed in D/C meds. No apparent reason for D/C)  -Current home readings: 160/86, 153/76, 157/87, 161/90, 140/70 -Current dietary habits: see DM -Denies hypotensive/hypertensive symptoms -Educated on BP goals and benefits of medications for prevention of heart attack, stroke and kidney damage; Importance of home blood pressure monitoring; -Counseled to monitor BP at home daily, document, and provide log at future appointments -Recommended patient to continue to check blood pressure and document Collaborated with PCP and Cardiology on recommendation to titrate amlodipine to 10mg74mly as home monitoring shows BP consistently elevated. - Will follow up with patient once plan is in place.  Hyperlipidemia/CAD: (LDL goal < 70) -Controlled -Current treatment:  Simvastatin 20mg 46my at bedtime  Aspirin 81mg -24mcations previously tried: none noted  -Current dietary patterns: see DM -Current exercise habits: minimal -Educated on Cholesterol goals;  Benefits of statin for ASCVD risk reduction; -Recommended to continue current medication Assessed adherence, patient takes daily.  -LDL at goal  Diabetes (A1c goal <7.5) -Controlled -Current medications: . None -Medications previously tried: metformin?   -Current home glucose readings . She does not check at home -Denies hypoglycemic/hyperglycemic symptoms -Current meal patterns:  B - 2 scrambled eggs with cheese, bacon bits, crossiant; cereal with fruit L - Usually skips (pb crackers) Dinner - Salad, leftover chicken breast Drinks - coffee w/ cream and splenda -Current exercise: occasional biking on her porch -Educated onA1c and blood sugar goals; -Counseled to check feet daily and get yearly eye exams -Recommended to continue current management strategy.  RLS (Goal: control symptoms) -Controlled -Current treatment   Pramipexole 0.25mg tw49mdaily -Medications previously tried: none noted -Patient noted that her swelling has improved, no longer has pain in R calf -Discussed when to report to ER given her history of DVT -Recommended to continue current medication   Patient Goals/Self-Care Activities . Over the next 180 days, patient will:  - take medications as prescribed focus on medication adherence by pill count check blood pressure daily, document, and provide at future appointments  Follow Up Plan: The care management team will reach out to the patient again over the next 180 days.         Medication Assistance: None required.  Patient affirms current coverage meets needs.  Patient's preferred pharmacy is:  [facility] [phone] #[id number] - [phone] 100% compliance  We discussed: Benefits of medication synchronization, packaging and delivery as well as enhanced pharmacist oversight with [employer]. Patient decided to: Continue current medication management strategy  Care Plan and Follow Up Patient Decision:  Patient agrees to Care Plan and  Follow-up.  Plan: The care management team will reach out to the patient again over the next 180 days.  ChristianBeverly MilchClinical Pharmacist [place]  [place] [phone]

## 2020-09-07 ENCOUNTER — Telehealth: Payer: Self-pay | Admitting: *Deleted

## 2020-09-07 NOTE — Telephone Encounter (Signed)
Patient notified of note and appt for nurse visit scheduled for 09/29/20

## 2020-09-07 NOTE — ED Provider Notes (Signed)
[facility]    CSN: [id number] Arrival date & time: 09/05/20  1302      History   Chief Complaint Chief Complaint  Patient presents with  . Gastroesophageal Reflux  . Cough    HPI DRISANA SCHWEICKERT is a 86 y.o. female history of CAD, DM type II, GERD, presenting today for evaluation of a cough.  Reports that she has had a cough for approximately 1 week, began after she woke up after aspirating some acid/fluid.  At times she reports wheezing and shortness of breath.  Seen by cardiology earlier today as she had monitor placed as she is being worked up for shaking, dizziness spells.  She denies fevers.  HPI  Past Medical History:  Diagnosis Date  . Cancer [facility])    cervical cancer after hysterectomy  . Coronary artery disease cardiologist--- dr Burt Knack   hx NSTEMI  w/ cardiac cath 03-16-2005 PCI with DES to LCx;   cardiac cath 06-27-2005  PCI with DES to RCA with residual dx LAD manage medcially;  myoview/ lexiscan 01-26-20211 normal no ischemia, ef 70%;  stress echo w/ dobutamine 10-24-2011 negative ishcmeia , normal ef // Myoview 2/22: EF 67, no ischemia or infarction, no TID, low risk   . Diabetes mellitus type 2, diet-controlled ([place])    followed by pcp  diet controlled does not check cbg  . Echocardiogram 08/2020    Echocardiogram 2/22: EF 55-60, no RWMA, mild LVH, Gr 2 DD, GLS-21.7%, normal RVSF, trivial MR, RVSP 39.5  . GERD (gastroesophageal reflux disease)   . Hiatal hernia    recurrence,  hx HH repair 1989  . History of cervical cancer    areas burned off   . History of DVT of lower extremity 2016   11-29-2014 post op right TKA of right lower extremity and completed xarelto   . History of esophageal stricture    hx s/p dilatation's  . History of gastric ulcer 2005 approx.  Marland Kitchen History of palpitations 2010   event monitor 07-07-2009 showed NSR w/ freq. SVT ectopies with short runs, rare PVCs  . History of TIA (transient ischemic attack) 06/1999   12-15-2019   per pt had several TIA between 12/ 2000 to 02/ 2001 , was sent to specialist _0 , had test that was normal and pt stated no TIAs since but has residual of essential tremors of right arm/ hand  . Hypertension   . IT band syndrome   . Migraine    "ice pick headche lasts about 30 seconds"  . Mixed hyperlipidemia   . Multiple thyroid nodules    followed by pcp---   ultrasound 11-22-2014 no bx   (12-15-2019 per pt had a endocrinologist and was told did not need bx)  . OA (osteoarthritis)    knees, elbow, hip, ankles  . Occasional tremors    right arm/ hand  s/p TIA residual 2000  . Osteoporosis    taking vitamin d  . Pelvic relaxation   . 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 Right bundle branch block (RBBB) with left anterior fascicular block (LAFB)   . RLS (restless legs syndrome)   . S/P drug eluting coronary stent placement 2006   03-16-2005  PCI x1 DES to LCx;   06-27-2005  PCI x1 DES to RCA  . SUI (stress urinary incontinence, female)     Patient Active Problem List   Diagnosis Date Noted  . Low back pain radiating to right leg 07/14/2020  . Pelvic relaxation  due to rectocele 04/06/2020  . Prolapse of female pelvic organs 12/22/2019  . Pelvic relaxation 12/22/2019  . Right hip pain 10/13/2019  . Left foot pain 03/08/2019  . Pelvic prolapse 03/08/2019  . Pain of breast 01/29/2019  . Overactive bladder 01/29/2019  . Educated about COVID-19 virus infection 12/02/2018  . Hyperlipidemia associated with type 2 diabetes mellitus ([place]) 05/27/2017  . Right shoulder pain 04/29/2015  . Diarrhea 04/17/2015  . Headache 04/17/2015  . Multinodular goiter 01/17/2015  . Peroneal DVT (deep venous thrombosis) ([place]) 12/22/2014  . S/P total knee arthroplasty 11/29/2014  . Arthritis of knee, degenerative 11/19/2014  . Right knee pain 11/19/2014  . Neck pain 10/29/2014  . Insomnia 02/05/2014  . Preventative health care 11/22/2013  . RLS (restless legs syndrome) 10/04/2013  .  Lower urinary tract infectious disease 10/04/2013  . Cataracts, bilateral 04/02/2013  . Leg swelling 01/17/2012  . Hypokalemia 01/05/2012  . Anemia 01/05/2012  . Depression with anxiety 01/05/2012  . Postoperative anemia due to acute blood loss 11/15/2011  . 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 Pre-syncope 10/13/2011  . DM (diabetes mellitus) ([place]) 10/06/2011  . Pulmonary nodule 10/06/2011  . Epigastric pain 10/06/2011  . DJD (degenerative joint disease) of knee 09/18/2011  . It band syndrome, right 08/28/2011  . Renal insufficiency 08/28/2011  . CORONARY ATHEROSCLEROSIS NATIVE CORONARY ARTERY 03/07/2010  . Coronary atherosclerosis 01/18/2009  . FATIGUE 01/18/2009  . PERSISTENT DISORDER INITIATING/MAINTAINING SLEEP 09/09/2008  . DERMATITIS 10/16/2007  . PERIPHERAL EDEMA 10/16/2007  . PALPITATIONS 10/16/2007  . Essential hypertension 04/14/2007  . GERD 04/14/2007  . TRANSIENT ISCHEMIC ATTACK, HX OF 04/14/2007    Past Surgical History:  Procedure Laterality Date  . ANTERIOR AND POSTERIOR REPAIR N/A 12/22/2019   Procedure: ANTERIOR (CYSTOCELE)  REPAIR;  Surgeon: Janyth Contes, MD;  Location: [facility];  Service: Gynecology;  Laterality: N/A;  . CATARACT EXTRACTION W/ INTRAOCULAR LENS  IMPLANT, BILATERAL  2015  . COLONOSCOPY  last one ?  . CORONARY ANGIOPLASTY WITH STENT PLACEMENT  03-16-2005   dr Lia Foyer   PCI and DES x1 to LCx  . CORONARY ANGIOPLASTY WITH STENT PLACEMENT  06-27-2005  dr Lia Foyer   PCI and DES x1 to RCA with residual disease LAD 70-80% to manage medically  . FOOT SURGERY Left 1990s   left foot stress fracture repair, per pt no hardware  . HIATAL HERNIA REPAIR  1989  . KNEE ARTHROSCOPY Bilateral right ?/   left x2 , last one 09-12-2009 @ [facility]  . RECTOCELE REPAIR N/A 04/20/2020   Procedure: POSTERIOR REPAIR (RECTOCELE);  Surgeon: Janyth Contes, MD;  Location: [facility];  Service: Gynecology;  Laterality: N/A;   . TOTAL KNEE ARTHROPLASTY  11/12/2011   Procedure: TOTAL KNEE ARTHROPLASTY;  Surgeon: Lorn Junes, MD;  Location: [facility];  Service: Orthopedics;  Laterality: Left;  Dr Noemi Chapel wants 90 minutes for this case  . TOTAL KNEE ARTHROPLASTY Right 11/29/2014   Procedure: RIGHT TOTAL KNEE ARTHROPLASTY;  Surgeon: Vickey Huger, MD;  Location: [facility];  Service: Orthopedics;  Laterality: Right;  . UPPER GASTROINTESTINAL ENDOSCOPY  last one 04-25-2017   with dilatation esophageal stricture and savary dilatation  . VAGINAL HYSTERECTOMY  1988    no ovaries removed for bleeeding    OB History   No obstetric history on file.      Home Medications    Prior to Admission medications   Medication Sig Start Date End Date Taking? Authorizing Provider  albuterol (VENTOLIN HFA)  108 (90 Base) MCG/ACT inhaler Inhale 1-2 puffs into the lungs every 6 (six) hours as needed for wheezing or shortness of breath. 09/05/20  Yes Nolin Grell C, PA-C  azithromycin (ZITHROMAX) 250 MG tablet Take 1 tablet (250 mg total) by mouth daily. Take first 2 tablets together, then 1 every day until finished. 09/05/20  Yes Alana Dayton C, PA-C  benzonatate (TESSALON) 200 MG capsule Take 1 capsule (200 mg total) by mouth 3 (three) times daily as needed for up to 7 days for cough. 09/05/20 09/12/20 Yes Thorin Starner C, PA-C  acetaminophen (TYLENOL) 500 MG tablet Take 2 tablets (1,000 mg total) by mouth every 6 (six) hours as needed for mild pain. 12/23/19   Bovard-Stuckert, Jeral Fruit, MD  amLODipine (NORVASC) 10 MG tablet Take 1 tablet (10 mg total) by mouth daily. 09/06/20   Mosie Lukes, MD  Ascorbic Acid (VITAMIN C) 500 MG CHEW Chew by mouth daily.    [provider]  aspirin EC 81 MG tablet Take 1 tablet (81 mg total) by mouth daily. 05/04/15   [name], MD  Blood Glucose Monitoring Suppl (TRUE METRIX AIR GLUCOSE METER) w/Device KIT USE TO CHECK BLOOD SUGAR ONCE DAILY AND AS NEEDED.  DX CODE E11.9 07/27/20   Mosie Lukes,  MD  Cholecalciferol (VITAMIN D) 50 MCG (2000 UT) CAPS Take 2,000 Units by mouth daily.     [provider]  famotidine (PEPCID) 20 MG tablet Take 20 mg by mouth at bedtime as needed for heartburn or indigestion.    [provider]  glucose blood (TRUE METRIX BLOOD GLUCOSE TEST) test strip USE TO CHECK BLOOD SUGAR ONCE A DAY OR AS NEEDED.  DX CODE: E11.9 07/27/20   Mosie Lukes, MD  hyoscyamine (LEVSIN SL) 0.125 MG SL tablet Place 1 tablet (0.125 mg total) under the tongue every 4 (four) hours as needed. Patient taking differently: Place 0.125 mg under the tongue every 4 (four) hours as needed for cramping. 03/06/19   Mosie Lukes, MD  isosorbide mononitrate (IMDUR) 60 MG 24 hr tablet Take 1 tablet (60 mg total) by mouth daily. 08/19/20   [name] T, PA-C  losartan (COZAAR) 100 MG tablet TAKE 1 TABLET EVERY DAY Patient taking differently: Take 100 mg by mouth daily. 06/04/20   Mosie Lukes, MD  magnesium oxide (MAG-OX) 400 MG tablet Take 400 mg by mouth 2 (two) times daily.    [provider]  metoprolol succinate (TOPROL-XL) 50 MG 24 hr tablet Take 1.5 tablets (75 mg total) by mouth at bedtime. Take with or immediately following a meal. 08/29/20 08/29/21  [name] T, PA-C  nitroGLYCERIN (NITROSTAT) 0.4 MG SL tablet Place 1 tablet (0.4 mg total) under the tongue every 5 (five) minutes as needed for chest pain. 08/29/20 08/29/21  [name] T, PA-C  omeprazole (PRILOSEC) 20 MG capsule Take 1 capsule (20 mg total) by mouth daily as needed. Patient taking differently: Take 20 mg by mouth at bedtime. 03/01/20   Mosie Lukes, MD  ondansetron (ZOFRAN ODT) 4 MG disintegrating tablet Take 1 tablet (4 mg total) by mouth every 6 (six) hours as needed for nausea or vomiting. 12/15/19   Armbruster, Carlota Raspberry, MD  OVER THE COUNTER MEDICATION Zinc q day    [provider]  potassium chloride SA (KLOR-CON) 20 MEQ tablet Take 1 tablet (20 mEq total) by mouth  daily. Patient taking differently: Take 20 mEq by mouth at bedtime. 01/01/20   [name],  MD  pramipexole (MIRAPEX) 0.25 MG tablet TAKE 1 TABLET TWICE DAILY Patient taking differently: Take 0.25 mg by mouth 2 times daily at 12 noon and 4 pm. 05/05/20   Mosie Lukes, MD  simvastatin (ZOCOR) 20 MG tablet TAKE 1 TABLET AT BEDTIME 09/01/20   [name] T, PA-C  traMADol (ULTRAM) 50 MG tablet Take 1-2 tablets (50-100 mg total) by mouth every 6 (six) hours as needed for moderate pain. 04/21/20   Bovard-Stuckert, Jeral Fruit, MD  TRUEplus Lancets 33G MISC USE TO CHECK BLOOD SUGAR ONCE DAILY AND AS NEEDED.  DX CODE: E11.9 07/27/20   Mosie Lukes, MD    Family History Family History  Problem Relation Age of Onset  . Stroke Father        family hx of M 1st degree relative <50  . Coronary artery disease Mother   . Heart disease Mother   . Depression Brother   . Stroke Brother   . Diabetes Brother   . Cancer Brother        bladder with mets  . Diabetes Daughter        borderline  . Hypertension Daughter   . Arthritis Other        family hx of  . Hypertension Other        family hx of  . Other Other        family hx of cardiovascular disorder  . Thyroid disease Daughter   . Breast cancer Neg Hx   . Colon cancer Neg Hx   . Anesthesia problems Neg Hx   . Hypotension Neg Hx   . Malignant hyperthermia Neg Hx   . Pseudochol deficiency Neg Hx   . Colon polyps Neg Hx   . Esophageal cancer Neg Hx   . Rectal cancer Neg Hx   . Stomach cancer Neg Hx     Social History Social History   Tobacco Use  . Smoking status: Never Smoker  . Smokeless tobacco: Never Used  Vaping Use  . Vaping Use: Never used  Substance Use Topics  . Alcohol use: No    Alcohol/week: 0.0 standard drinks  . Drug use: Never     Allergies   Baclofen and Nitrofurantoin   Review of Systems Review of Systems  Constitutional: Negative for activity change, appetite change, chills, fatigue and fever.  HENT:  Negative for congestion, ear pain, rhinorrhea, sinus pressure, sore throat and trouble swallowing.   Eyes: Negative for discharge and redness.  Respiratory: Positive for cough and wheezing. Negative for chest tightness and shortness of breath.   Cardiovascular: Negative for chest pain.  Gastrointestinal: Negative for abdominal pain, diarrhea, nausea and vomiting.  Musculoskeletal: Negative for myalgias.  Skin: Negative for rash.  Neurological: Negative for dizziness, light-headedness and headaches.     Physical Exam Triage Vital Signs ED Triage Vitals  Enc Vitals Group     BP 09/05/20 1439 (!) 186/44     Pulse Rate 09/05/20 1439 64     Resp 09/05/20 1439 18     Temp 09/05/20 1439 98.1 F (36.7 C)     Temp Source 09/05/20 1439 Oral     SpO2 09/05/20 1439 95 %     Weight --      Height --      Head Circumference --      Peak Flow --      Pain Score 09/05/20 1446 0     Pain Loc --      Pain Edu? --  Excl. in GC? --    No data found.  Updated Vital Signs BP (!) 186/44 (BP Location: Left Arm)   Pulse 64   Temp 98.1 F (36.7 C) (Oral)   Resp 18   SpO2 95%   Visual Acuity Right Eye Distance:   Left Eye Distance:   Bilateral Distance:    Right Eye Near:   Left Eye Near:    Bilateral Near:     Physical Exam Vitals and nursing note reviewed.  Constitutional:      Appearance: She is well-developed and well-nourished.     Comments: No acute distress  HENT:     Head: Normocephalic and atraumatic.     Nose: Nose normal.     Mouth/Throat:     Comments: Oral mucosa pink and moist, no tonsillar enlargement or exudate. Posterior pharynx patent and nonerythematous, no uvula deviation or swelling. Normal phonation. Eyes:     Conjunctiva/sclera: Conjunctivae normal.  Cardiovascular:     Rate and Rhythm: Normal rate.     Comments: Heart monitor noted on central sternum Pulmonary:     Effort: Pulmonary effort is normal. No respiratory distress.     Comments: Breathing  comfortably at rest, CTABL, no wheezing, rales or other adventitious sounds auscultated  Coarse cough spells noted in room Abdominal:     General: There is no distension.  Musculoskeletal:        General: Normal range of motion.     Cervical back: Neck supple.  Skin:    General: Skin is warm and dry.  Neurological:     Mental Status: She is alert and oriented to person, place, and time.  Psychiatric:        Mood and Affect: Mood and affect normal.      UC Treatments / Results  Labs (all labs ordered are listed, but only abnormal results are displayed) Labs Reviewed - No data to display  EKG   Radiology DG Chest 2 View  Result Date: 09/05/2020 CLINICAL DATA:  Cough 1 week.  Wheezing EXAM: CHEST - 2 VIEW COMPARISON:  08/26/2020 FINDINGS: Heart size and vascularity normal. Lungs are clear without infiltrate or effusion. Hiatal hernia with air-fluid level. Right coronary stent. Implanted device overlying the sternum. IMPRESSION: No active cardiopulmonary disease. Electronically Signed   By: Franchot Gallo M.D.   On: 09/05/2020 15:33    Procedures Procedures (including critical care time)  Medications Ordered in UC Medications - No data to display  Initial Impression / Assessment and Plan / UC Course  I have reviewed the triage vital signs and the nursing notes.  Pertinent labs & imaging results that were available during my care of the patient were reviewed by me and considered in my medical decision making (see chart for details).     Chest x-ray without signs of pneumonia, did opt to place on azithromycin to cover atypicals given patient's age and history, deferring further steroids at this time, no wheezing noted in clinic, providing albuterol inhaler to use as needed, Tessalon for cough, continue to monitor breathing and symptoms,Discussed strict return precautions. Patient verbalized understanding and is agreeable with plan.  Final Clinical Impressions(s) / UC  Diagnoses   Final diagnoses:  Cough  Lower respiratory infection (e.g., bronchitis, pneumonia, pneumonitis, pulmonitis)     Discharge Instructions     Begin azithromycin-2 tablets today, 1 tablet for the following 4 days Tessalon/benzonatate every 8 hours for cough Albuterol inhaler as needed Follow-up with cardiology as planned Return here if symptoms  changing or worsening   ED Prescriptions    Medication Sig Dispense Auth. Provider   azithromycin (ZITHROMAX) 250 MG tablet Take 1 tablet (250 mg total) by mouth daily. Take first 2 tablets together, then 1 every day until finished. 6 tablet Wieters, Hallie C, PA-C   benzonatate (TESSALON) 200 MG capsule Take 1 capsule (200 mg total) by mouth 3 (three) times daily as needed for up to 7 days for cough. 28 capsule Wieters, Hallie C, PA-C   albuterol (VENTOLIN HFA) 108 (90 Base) MCG/ACT inhaler Inhale 1-2 puffs into the lungs every 6 (six) hours as needed for wheezing or shortness of breath. 8 g Wieters, [place] C, PA-C     PDMP not reviewed this encounter.   Janith Lima, [place] 09/07/20 [phone]

## 2020-09-07 NOTE — Telephone Encounter (Signed)
-----  Message from Stacey A Blyth, MD sent at 09/06/2020  6:42 PM EST ----- Please let patient know I sent in a new prescription for 90 day supply of the Amlodipine 10 mg daily to her mail order pharmacy but she can double up her 5 mg tabs til the new ones get here and then have her come in for a nurse bp and pulse check in 2-4 weeks to confirm it is working. Thanks Dr B ----- Message ----- From: Weaver, Scott T, PA-C Sent: 09/06/2020   4:58 PM EST To: Stacey A Blyth, MD, Christian L Davis, RPH  Yes.  Increasing amlodipine to 10 mg once daily sounds like a good idea. Can you arrange that or do you need me to? Scott  Scott Weaver, PA-C    09/06/2020 4:58 PM   ----- Message ----- From: Davis, Christian L, RPH Sent: 09/06/2020   3:53 PM EST To: Scott T Weaver, PA-C, Stacey A Blyth, MD  Hello,  Met with Heather [name] today for a CCM follow up visit.  She reported the following home blood pressures:   160/86, 153/76, 157/87, 161/90, 140/70  Wondering your thoughts on titration of her Amlodipine to 10mg daily?  She reports that she currently takes the extra tablet as needed but was yet to take it given those blood pressures above.    Thanks! Christian Davis, PharmD Clinical Pharmacist [facility] [phone]      

## 2020-09-08 ENCOUNTER — Other Ambulatory Visit (HOSPITAL_COMMUNITY): Payer: Medicare HMO

## 2020-09-08 DIAGNOSIS — N3946 Mixed incontinence: Secondary | ICD-10-CM | POA: Diagnosis not present

## 2020-09-08 DIAGNOSIS — R309 Painful micturition, unspecified: Secondary | ICD-10-CM | POA: Diagnosis not present

## 2020-09-08 DIAGNOSIS — N8189 Other female genital prolapse: Secondary | ICD-10-CM | POA: Diagnosis not present

## 2020-09-12 ENCOUNTER — Encounter (HOSPITAL_COMMUNITY): Payer: Medicare HMO

## 2020-09-13 ENCOUNTER — Encounter: Payer: Self-pay | Admitting: Cardiovascular Disease

## 2020-09-13 NOTE — Progress Notes (Signed)
Cardiology Office Note:    Date:  09/14/2020   ID:  LUCKY ALVERSON, DOB 10/03/1934, MRN [id number]  PCP:  Heather Lukes, MD   [place]  Cardiologist:  Heather Mocha, MD   Advanced Practice Provider:  Liliane Shi, PA-C Electrophysiologist:  None       Referring MD: Heather Lukes, MD   Chief Complaint:  Follow-up (Shortness of breath, palpitations )    Patient Profile:    Heather [name] is a 86 y.o. female with:   Coronary artery disease  ? S/p Taxus DES to LCx 9/06 ? S/p Taxus DES to RCA 12/06 ? Residual dz 12/06: mLAD 70-80  Borderline diabetes mellitus   GERD   Hypertension   Hyperlipidemia   Hx of CVA  Hx of post op DVT (after TKR) in 2016; Rivaroxaban x 6 mos  Prior CV studies: Myoview 08/31/20 No ischemia or scar, EF 67; low risk   Echocardiogram 08/26/20 EF 55-60, no RWMA, mild conc LVH, Gr 2 DD, GLS -21.7%, normal RVSF, trivial MR  Carotid US 11/17/15 Bilat ICA 1-39  ETT Echocardiogram 10/24/11 Normal   Myoview 08/03/09 EF 70, no scar or ischemia  Cardiac catheterization 06/27/05 LM 20 LAD mid 70-80; Dx ost 36 OM stent patent RCA mid 70-80 EF 55 PCI: Taxus DES to RCA  Myoview 04/25/05 No ischemia or scar, EF 70  History of Present Illness:    Heather [name] was last seen in 08/2020 with symptoms of exhaustion and shortness of breath with minimal exertion with associated tachycardia.  .  A BNP was not significantly elevated.  A Myoview was low risk and neg for ischemia.  An echocardiogram demonstrated normal EF.  She went to the ED 08/26/20 with recurrent symptoms.  Her DDimer was high and a chest CTA demonstrated no PE.  She noted palpitations again and I increased her beta-blocker dose. An event monitor is currently pending.  She went to the ED agin in 2/222 with a lower respiratory tract infection and was placed on an inhaler and antibiotics.    She returns for f/u.  She is here with her daughter.  She feels  her fatigue and palpitations are better.  Her breathing seems to be stable.  She still gets short of breath with walking to her mailbox.  She does have a lot of back and hip pain.  She was in PT until recently.  She was asked to f/u here for her shortness of breath.  She has not had chest pain or jaw pain (jaw pain was her anginal equivalent previously).  She has not had syncope, orthopnea, leg edema.  She has been under a lot of stress the past 2 years.  She did have significant reflux symptoms after her Myoview and also had difficultly tolerating the positioning in the scanner.        Past Medical History:  Diagnosis Date  . Cancer [facility])    cervical cancer after hysterectomy  . Coronary artery disease cardiologist--- dr Heather [name]   hx NSTEMI  w/ cardiac cath 03-16-2005 PCI with DES to LCx;   cardiac cath 06-27-2005  PCI with DES to RCA with residual dx LAD manage medcially;  myoview/ lexiscan 01-26-20211 normal no ischemia, ef 70%;  stress echo w/ dobutamine 10-24-2011 negative ishcmeia , normal ef // Myoview 2/22: EF 67, no ischemia or infarction, no TID, low risk   . Diabetes mellitus type 2, diet-controlled ([place])    followed by pcp  diet controlled does not check cbg  . Echocardiogram 08/2020    Echocardiogram 2/22: EF 55-60, no RWMA, mild LVH, Gr 2 DD, GLS-21.7%, normal RVSF, trivial MR, RVSP 39.5  . GERD (gastroesophageal reflux disease)   . Hiatal hernia    recurrence,  hx HH repair 1989  . History of cervical cancer    areas burned off   . History of DVT of lower extremity 2016   11-29-2014 post op right TKA of right lower extremity and completed xarelto   . History of esophageal stricture    hx s/p dilatation's  . History of gastric ulcer 2005 approx.  Marland Kitchen History of palpitations 2010   event monitor 07-07-2009 showed NSR w/ freq. SVT ectopies with short runs, rare PVCs  . History of TIA (transient ischemic attack) 06/1999   12-15-2019  per pt had several TIA between 12/ 2000 to 02/ 2001  , was sent to specialist '@[facility]' , had test that was normal and pt stated no TIAs since but has residual of essential tremors of right arm/ hand  . Hypertension   . IT band syndrome   . Migraine    "ice pick headche lasts about 30 seconds"  . Mixed hyperlipidemia   . Multiple thyroid nodules    followed by pcp---   ultrasound 11-22-2014 no bx   (12-15-2019 per pt had a endocrinologist and was told did not need bx)  . OA (osteoarthritis)    knees, elbow, hip, ankles  . Occasional tremors    right arm/ hand  s/p TIA residual 2000  . Osteoporosis    taking vitamin d  . Pelvic relaxation   . 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 Right bundle branch block (RBBB) with left anterior fascicular block (LAFB)   . RLS (restless legs syndrome)   . S/P drug eluting coronary stent placement 2006   03-16-2005  PCI x1 DES to LCx;   06-27-2005  PCI x1 DES to RCA  . SUI (stress urinary incontinence, female)     Current Medications: Current Meds  Medication Sig  . acetaminophen (TYLENOL) 500 MG tablet Take 2 tablets (1,000 mg total) by mouth every 6 (six) hours as needed for mild pain.  Marland Kitchen albuterol (VENTOLIN HFA) 108 (90 Base) MCG/ACT inhaler Inhale 1-2 puffs into the lungs every 6 (six) hours as needed for wheezing or shortness of breath.  Marland Kitchen amLODipine (NORVASC) 10 MG tablet Take 1 tablet (10 mg total) by mouth daily.  . Ascorbic Acid (VITAMIN C) 500 MG CHEW Chew by mouth daily.  Marland Kitchen aspirin EC 81 MG tablet Take 1 tablet (81 mg total) by mouth daily.  Marland Kitchen azithromycin (ZITHROMAX) 250 MG tablet Take 1 tablet (250 mg total) by mouth daily. Take first 2 tablets together, then 1 every day until finished.  . Blood Glucose Monitoring Suppl (TRUE METRIX AIR GLUCOSE METER) w/Device KIT USE TO CHECK BLOOD SUGAR ONCE DAILY AND AS NEEDED.  DX CODE E11.9  . cephALEXin (KEFLEX) 500 MG capsule cephalexin 500 mg capsule  Take 1 capsule every 8 hours by oral route for 10 days.  . Cholecalciferol (VITAMIN D) 50  MCG (2000 UT) CAPS Take 2,000 Units by mouth daily.   . famotidine (PEPCID) 20 MG tablet Take 20 mg by mouth at bedtime as needed for heartburn or indigestion.  Marland Kitchen glucose blood (TRUE METRIX BLOOD GLUCOSE TEST) test strip USE TO CHECK BLOOD SUGAR ONCE A DAY OR AS NEEDED.  DX CODE: E11.9  . hyoscyamine (LEVSIN SL)  0.125 MG SL tablet Place 1 tablet (0.125 mg total) under the tongue every 4 (four) hours as needed.  . isosorbide mononitrate (IMDUR) 60 MG 24 hr tablet Take 1 tablet (60 mg total) by mouth daily.  Marland Kitchen losartan (COZAAR) 100 MG tablet TAKE 1 TABLET EVERY DAY  . magnesium oxide (MAG-OX) 400 MG tablet Take 400 mg by mouth 2 (two) times daily.  . metoprolol succinate (TOPROL-XL) 50 MG 24 hr tablet Take 1.5 tablets (75 mg total) by mouth at bedtime. Take with or immediately following a meal.  . nitroGLYCERIN (NITROSTAT) 0.4 MG SL tablet Place 1 tablet (0.4 mg total) under the tongue every 5 (five) minutes as needed for chest pain.  Marland Kitchen omeprazole (PRILOSEC) 20 MG capsule Take 1 capsule (20 mg total) by mouth daily as needed.  . ondansetron (ZOFRAN ODT) 4 MG disintegrating tablet Take 1 tablet (4 mg total) by mouth every 6 (six) hours as needed for nausea or vomiting.  Marland Kitchen OVER THE COUNTER MEDICATION Zinc q day  . oxybutynin (DITROPAN) 5 MG tablet oxybutynin chloride 5 mg tablet  Take 1 tablet twice a day by oral route.  . pramipexole (MIRAPEX) 0.25 MG tablet TAKE 1 TABLET TWICE DAILY  . simvastatin (ZOCOR) 20 MG tablet TAKE 1 TABLET AT BEDTIME  . spironolactone (ALDACTONE) 25 MG tablet Take 0.5 tablets (12.5 mg total) by mouth daily.  . traMADol (ULTRAM) 50 MG tablet Take 1-2 tablets (50-100 mg total) by mouth every 6 (six) hours as needed for moderate pain.  . TRUEplus Lancets 33G MISC USE TO CHECK BLOOD SUGAR ONCE DAILY AND AS NEEDED.  DX CODE: E11.9  . [DISCONTINUED] potassium chloride SA (KLOR-CON) 20 MEQ tablet Take 1 tablet (20 mEq total) by mouth daily.     Allergies:   Baclofen and  Nitrofurantoin   Social History   Tobacco Use  . Smoking status: Never Smoker  . Smokeless tobacco: Never Used  Vaping Use  . Vaping Use: Never used  Substance Use Topics  . Alcohol use: No    Alcohol/week: 0.0 standard drinks  . Drug use: Never     Family Hx: The patient's family history includes Arthritis in an other family member; Cancer in her brother; Coronary artery disease in her mother; Depression in her brother; Diabetes in her brother and daughter; Heart disease in her mother; Hypertension in her daughter and another family member; Other in an other family member; Stroke in her brother and father; Thyroid disease in her daughter. There is no history of Breast cancer, Colon cancer, Anesthesia problems, Hypotension, Malignant hyperthermia, Pseudochol deficiency, Colon polyps, Esophageal cancer, Rectal cancer, or Stomach cancer.  ROS   EKGs/Labs/Other Test Reviewed:    EKG:  EKG is not ordered today.  The ekg ordered today demonstrates n/a  Recent Labs: 07/14/2020: ALT 15; TSH 2.07 08/19/2020: Magnesium 1.7; NT-Pro BNP 428 08/26/2020: BUN 24; Creatinine, Ser 1.11; Hemoglobin 12.9; Platelets 166; Potassium 4.6; Sodium 139   Recent Lipid Panel Lab Results  Component Value Date/Time   CHOL 150 07/14/2020 09:21 AM   TRIG 117.0 07/14/2020 09:21 AM   HDL 62.10 07/14/2020 09:21 AM   CHOLHDL 2 07/14/2020 09:21 AM   LDLCALC 65 07/14/2020 09:21 AM   LDLDIRECT 73.4 05/20/2014 04:51 PM      Risk Assessment/Calculations:      Physical Exam:    VS:  BP 140/60   Pulse 62   Ht 5' (1.524 m)   Wt 163 lb (73.9 kg)   SpO2 96%  BMI 31.83 kg/m     Wt Readings from Last 3 Encounters:  09/14/20 163 lb (73.9 kg)  08/31/20 159 lb (72.1 kg)  08/19/20 159 lb 6.4 oz (72.3 kg)     Constitutional:      Appearance: Healthy appearance. Not in distress.  Neck:     Vascular: JVD normal.  Pulmonary:     Effort: Pulmonary effort is normal.     Breath sounds: No wheezing. No rales.   Cardiovascular:     Normal rate. Regular rhythm. Normal S1. Normal S2.     Murmurs: There is no murmur.  Edema:    Peripheral edema absent.  Abdominal:     Palpations: Abdomen is soft.  Skin:    General: Skin is warm and dry.  Neurological:     Mental Status: Alert and oriented to person, place and time.     Cranial Nerves: Cranial nerves are intact.       ASSESSMENT & PLAN:    1. Shortness of breath As noted, she recently underwent evaluation with an echocardiogram and stress test.  Her Myoview demonstrated no ischemia.  Her echocardiogram demonstrated normal LV function.  She did have moderate diastolic dysfunction on echocardiogram.  Her BNP was normal.  Her blood pressure is above target.  Question if she may have mild volume excess contributing to her shortness of breath in the setting of moderate diastolic dysfunction.  We discussed adding a low-dose diuretic to see if this helps her symptoms.  She does have a history of hypokalemia.  Therefore, I think spironolactone would be a good choice.  I will place her on spironolactone 12.5 mg daily.  Reduce potassium to 10 mg daily.  Check a BMET once a week x2.  If she does not have significant improvement with adding spironolactone, we could consider discontinuing this.  I think she can participate in physical therapy now.  If she has worsening symptoms over time, we will need to see her back sooner to consider further evaluation.  2. Palpitations Monitor is currently pending.  She does feel that her palpitations have improved with the higher dose of beta-blocker.  I suspect she has PACs causing her symptoms.    3. Coronary artery disease involving native coronary artery of native heart without angina pectoris History of DES to the LCx and RCA in 2006.  Recent Myoview low risk.  She is not having chest pain.  Therefore, I do not think she needs further ischemic testing.  If her breathing gets worse over time or she develops jaw pain which  was her anginal equivalent, we may need to consider cardiac catheterization.  Continue aspirin, isosorbide, metoprolol succinate, simvastatin.  Follow-up in 3 months with Dr. Burt [name] or me.  4. Essential hypertension Blood pressure above target.  Add spironolactone as noted.  5. Gastroesophageal reflux disease without esophagitis She did have significant reflux after a Myoview.  I am not certain that Lexiscan was the sole cause of her symptoms.  However, we will need to keep this in mind for the future.  Lexiscan Myoview may not be the best choice for her going forward.    Dispo:  Return in about 3 months (around 12/15/2020) for Routine visit with [name], PA.   Medication Adjustments/Labs and Tests Ordered: Current medicines are reviewed at length with the patient today.  Concerns regarding medicines are outlined above.  Tests Ordered: Orders Placed This Encounter  Procedures  . Basic Metabolic Panel (BMET)  . Basic Metabolic Panel (  BMET)   Medication Changes: Meds ordered this encounter  Medications  . potassium chloride SA (KLOR-CON) 20 MEQ tablet    Sig: Take 0.5 tablets (10 mEq total) by mouth daily.    Dispense:  45 tablet    Refill:  2  . spironolactone (ALDACTONE) 25 MG tablet    Sig: Take 0.5 tablets (12.5 mg total) by mouth daily.    Dispense:  15 tablet    Refill:  3    Signed, [name], PA-C  09/14/2020 4:37 PM    [facility] [place], [place], [place]  [address] Phone: [phone]; Fax: [phone]

## 2020-09-14 ENCOUNTER — Encounter: Payer: Self-pay | Admitting: Physician Assistant

## 2020-09-14 ENCOUNTER — Ambulatory Visit: Payer: Medicare HMO | Admitting: Physician Assistant

## 2020-09-14 ENCOUNTER — Other Ambulatory Visit: Payer: Self-pay

## 2020-09-14 VITALS — BP 140/60 | HR 62 | Ht 60.0 in | Wt 163.0 lb

## 2020-09-14 DIAGNOSIS — I2510 Atherosclerotic heart disease of native coronary artery without angina pectoris: Secondary | ICD-10-CM | POA: Diagnosis not present

## 2020-09-14 DIAGNOSIS — I10 Essential (primary) hypertension: Secondary | ICD-10-CM

## 2020-09-14 DIAGNOSIS — E782 Mixed hyperlipidemia: Secondary | ICD-10-CM

## 2020-09-14 DIAGNOSIS — R002 Palpitations: Secondary | ICD-10-CM | POA: Diagnosis not present

## 2020-09-14 DIAGNOSIS — K219 Gastro-esophageal reflux disease without esophagitis: Secondary | ICD-10-CM | POA: Diagnosis not present

## 2020-09-14 DIAGNOSIS — R0602 Shortness of breath: Secondary | ICD-10-CM | POA: Diagnosis not present

## 2020-09-14 MED ORDER — SPIRONOLACTONE 25 MG PO TABS: 12.5000 mg | ORAL_TABLET | Freq: Every day | ORAL | 3 refills | Status: DC

## 2020-09-14 MED ORDER — POTASSIUM CHLORIDE CRYS ER 20 MEQ PO TBCR: 10.0000 meq | EXTENDED_RELEASE_TABLET | Freq: Every day | ORAL | 2 refills | Status: DC

## 2020-09-14 NOTE — Patient Instructions (Signed)
Medication Instructions:  Your physician has recommended you make the following change in your medication:   1. Decrease your Potassium to one half tablet by mouth ( 10 meq) daily. 2.  Please start Sprinolactone one half tablet by mouth ( 12.5 mg) daily, sent in today # 30 to requested pharmacy.   *If you need a refill on your cardiac medications before your next appointment, please call your pharmacy*   Lab Work: Your physician recommends that you return for lab work in: Wednesday, March 16 anytime between 7:30 - 4:30. Your physician recommends that you return for lab work in: Wednesday, March 23 anytime between 7:30 - 4:30.  If you have labs (blood work) drawn today and your tests are completely normal, you will receive your results only by: Marland Kitchen MyChart Message (if you have MyChart) OR . A paper copy in the mail If you have any lab test that is abnormal or we need to change your treatment, we will call you to review the results.   Testing/Procedures: None   Follow-Up: At [facility], you and your health needs are our priority.  As part of our continuing mission to provide you with exceptional heart care, we have created designated Provider Care Teams.  These Care Teams include your primary Cardiologist (physician) and Advanced Practice Providers (APPs -  Physician Assistants and Nurse Practitioners) who all work together to provide you with the care you need, when you need it.  We recommend signing up for the patient portal called "MyChart".  Sign up information is provided on this After Visit Summary.  MyChart is used to connect with patients for Virtual Visits (Telemedicine).  Patients are able to view lab/test results, encounter notes, upcoming appointments, etc.  Non-urgent messages can be sent to your provider as well.   To learn more about what you can do with MyChart, go to [email or web address].    Your next appointment:   3 month(s) with [name], PA  The format for  your next appointment:   In Person  Provider:   You will see one of the following Advanced Practice Providers on your designated Care Team:    [name], PA-C     Other Instructions If your breathing is not better in 4 weeks with the sprinolactone please call @ [phone] or send mychart message.

## 2020-09-19 DIAGNOSIS — N8189 Other female genital prolapse: Secondary | ICD-10-CM | POA: Diagnosis not present

## 2020-09-19 DIAGNOSIS — R309 Painful micturition, unspecified: Secondary | ICD-10-CM | POA: Diagnosis not present

## 2020-09-20 ENCOUNTER — Telehealth: Payer: Self-pay | Admitting: Physician Assistant

## 2020-09-20 DIAGNOSIS — R309 Painful micturition, unspecified: Secondary | ICD-10-CM | POA: Diagnosis not present

## 2020-09-20 NOTE — Telephone Encounter (Signed)
S/w pt, pt needs to come in for labs tomorrow to check kidney function.  Pt agreeable to treatment plan.

## 2020-09-20 NOTE — Telephone Encounter (Signed)
Pt c/o medication issue:  1. Name of Medication: oxybutynin (DITROPAN) 5 MG tablet spironolactone (ALDACTONE) 25 MG tablet  2. How are you currently taking this medication (dosage and times per day)? Stopped taking Oxybutynin 09/17/20. Taking Spirolactone as prescribed    3. Are you having a reaction (difficulty breathing--STAT)? No   4. What is your medication issue? Heather [name] is calling wanting to know if she should still come to her lab appointment tomorrow. She states her OBGYN had her taking oxybutynin from 09/08/20-09/17/20. She is still currently taking spironolactone as prescribed, but didn't know if this would affect her lab results. Please advise.

## 2020-09-20 NOTE — Progress Notes (Signed)
[facility] New Patient Evaluation and Consultation  Referring Provider: Janyth Contes, * PCP: Mosie Lukes, MD Date of Service: 09/21/2020  SUBJECTIVE Chief Complaint: New Patient (Initial Visit) leakage of urine, pain  History of Present Illness: Heather [name] is a 86 y.o. White or Caucasian female seen in consultation at the request of Dr. Sandford Craze for evaluation of prolapse and incontinence.    Review of records from Dr Melba Coon- [place]uck[place] significant for: Having dysuria and lots of accidents.Has tried oxybutynin in the past.  Prolapse is worse with standing and with voiding.  She had an anterior repair in June 2021 and an posterior and enterocele repair in Oct 2021.   Urinary Symptoms: Leaks urine with cough/ sneeze, laughing, exercise, lifting, going from sitting to standing, with a full bladder, with movement to the bathroom, with urgency and without sensation . Urgency >SUI Leaks "lots" Pad use: 8-10 pads per day.   She is bothered by her UI symptoms. Currently has burning with urination. Took a course of antibiotics last week, finished it on Monday, keflex x10 days.  Tried oxybutynin but did not see improvement, had side effects like dry mouth. Also took Myrbetriq but was not covered by her insurance.   Day time voids every few hours.   Nocturia: 4-5 times per night to void. Voiding dysfunction: she does not empty her bladder well.  does not use a catheter to empty bladder.  When urinating, she feels a weak stream and difficulty starting urine stream Drinks: 2 cups half caff coffee, 4-16oz bottles water per day.  Stopped drinking before bed but still wakes up several times.   UTIs: 3-5 UTI's in the last year.   Denies history of blood in urine and kidney or bladder stones  Pelvic Organ Prolapse Symptoms:                  She Admits to a feeling of a bulge the vaginal area. It has been present since Decemeber She Admits to seeing a bulge.   This bulge is bothersome. Of note, granddaughter was diagnosed with Drue Dun but patient has not been formally diagnosed with this.   Bowel Symptom: Bowel movements: daily  Stool consistency: soft  Straining: no.  Splinting: no.  Incomplete evacuation: no.  She Admits to accidental bowel leakage / fecal incontinence  Occurs: sporadically, just started recently  Consistency with leakage: loose Bowel regimen: none Last colonoscopy: Date 2012, Results normal  Sexual Function Sexually active: no.    Pelvic Pain Admits to pelvic pain Pain occurs: going from sitting to standing    Past Medical History:  Past Medical History:  Diagnosis Date   Cancer ([place])    cervical cancer after hysterectomy   Coronary artery disease cardiologist--- dr Burt Knack   hx NSTEMI  w/ cardiac cath 03-16-2005 PCI with DES to LCx;   cardiac cath 06-27-2005  PCI with DES to RCA with residual dx LAD manage medcially;  myoview/ lexiscan 01-26-20211 normal no ischemia, ef 70%;  stress echo w/ dobutamine 10-24-2011 negative ishcmeia , normal ef // Myoview 2/22: EF 67, no ischemia or infarction, no TID, low risk    Diabetes mellitus type 2, diet-controlled ([place])    followed by pcp  diet controlled does not check cbg   Echocardiogram 08/2020    Echocardiogram 2/22: EF 55-60, no RWMA, mild LVH, Gr 2 DD, GLS-21.7%, normal RVSF, trivial MR, RVSP 39.5   GERD (gastroesophageal reflux disease)    Hiatal hernia  recurrence,  hx HH repair 1989   History of cervical cancer    areas burned off    History of DVT of lower extremity 2016   11-29-2014 post op right TKA of right lower extremity and completed xarelto    History of esophageal stricture    hx s/p dilatation's   History of gastric ulcer 2005 approx.   History of palpitations 2010   event monitor 07-07-2009 showed NSR w/ freq. SVT ectopies with short runs, rare PVCs   History of TIA (transient ischemic attack) 06/1999   12-15-2019  per pt  had several TIA between 12/ 2000 to 02/ 2001 , was sent to specialist @[facility] , had test that was normal and pt stated no TIAs since but has residual of essential tremors of right arm/ hand   Hypertension    IT band syndrome    Migraine    "ice pick headche lasts about 30 seconds"   Mixed hyperlipidemia    Multiple thyroid nodules    followed by pcp---   ultrasound 11-22-2014 no bx   (12-15-2019 per pt had a endocrinologist and was told did not need bx)   OA (osteoarthritis)    knees, elbow, hip, ankles   Occasional tremors    right arm/ hand  s/p TIA residual 2000   Osteoporosis    taking vitamin d   Pelvic relaxation    Pelvic relaxation due to rectocele 04/06/2020   Right bundle branch block (RBBB) with left anterior fascicular block (LAFB)    RLS (restless legs syndrome)    S/P drug eluting coronary stent placement 2006   03-16-2005  PCI x1 DES to LCx;   06-27-2005  PCI x1 DES to RCA   SUI (stress urinary incontinence, female)      Past Surgical History:   Past Surgical History:  Procedure Laterality Date   ANTERIOR AND POSTERIOR REPAIR N/A 12/22/2019   Procedure: ANTERIOR (CYSTOCELE)  REPAIR;  Surgeon: Janyth Contes, MD;  Location: [place];  Service: Gynecology;  Laterality: N/A;   CATARACT EXTRACTION W/ INTRAOCULAR LENS  IMPLANT, BILATERAL  2015   COLONOSCOPY  last one ?   CORONARY ANGIOPLASTY WITH STENT PLACEMENT  03-16-2005   dr Lia Foyer   PCI and DES x1 to LCx   CORONARY ANGIOPLASTY WITH STENT PLACEMENT  06-27-2005  dr Lia Foyer   PCI and DES x1 to RCA with residual disease LAD 70-80% to manage medically   FOOT SURGERY Left 1990s   left foot stress fracture repair, per pt no hardware   HIATAL HERNIA REPAIR  1989   KNEE ARTHROSCOPY Bilateral right ?/   left x2 , last one 09-12-2009 @ [facility] N/A 04/20/2020   Procedure: POSTERIOR REPAIR (RECTOCELE);  Surgeon: Janyth Contes, MD;  Location: [facility];  Service: Gynecology;  Laterality: N/A;   TOTAL KNEE ARTHROPLASTY  11/12/2011   Procedure: TOTAL KNEE ARTHROPLASTY;  Surgeon: Lorn Junes, MD;  Location: [place];  Service: Orthopedics;  Laterality: Left;  Dr Noemi Chapel wants 90 minutes for this case   TOTAL KNEE ARTHROPLASTY Right 11/29/2014   Procedure: RIGHT TOTAL KNEE ARTHROPLASTY;  Surgeon: Vickey Huger, MD;  Location: [place];  Service: Orthopedics;  Laterality: Right;   UPPER GASTROINTESTINAL ENDOSCOPY  last one 04-25-2017   with dilatation esophageal stricture and savary dilatation   VAGINAL HYSTERECTOMY  1988    no ovaries removed for bleeeding     Past OB/GYN History: OB History    Gravida  4   Para  1   Term      Preterm  1   AB      Living  4     SAB      IAB      Ectopic      Multiple      Live Births             Vaginal deliveries: 4,  Forceps/ Vacuum deliveries: 0, Cesarean section: 0 S/p hysterectomy 1988   Medications: She has a current medication list which includes the following prescription(s): acetaminophen, albuterol, amlodipine, vitamin c, aspirin ec, true metrix air glucose meter, vitamin d, famotidine, true metrix blood glucose test, hyoscyamine, isosorbide mononitrate, losartan, magnesium oxide, metoprolol succinate, nitroglycerin, omeprazole, ondansetron, OVER THE COUNTER MEDICATION, potassium chloride sa, pramipexole, simvastatin, sulfamethoxazole-trimethoprim, tramadol, trueplus lancets 33g, cephalexin, oxybutynin, and spironolactone.   Allergies: Patient is allergic to baclofen and nitrofurantoin.   Social History:  Social History   Tobacco Use   Smoking status: Never Smoker   Smokeless tobacco: Never Used  Scientific laboratory technician Use: Never used  Substance Use Topics   Alcohol use: No    Alcohol/week: 0.0 standard drinks   Drug use: Never    Relationship status: single She lives alone.   She is not employed. Regular exercise: No  Family History:   Family  History  Problem Relation Age of Onset   Stroke Father        family hx of M 1st degree relative <50   Coronary artery disease Mother    Heart disease Mother    Depression Brother    Stroke Brother    Diabetes Brother    Cancer Brother        bladder with mets   Diabetes Daughter        borderline   Hypertension Daughter    Arthritis Other        family hx of   Hypertension Other        family hx of   Other Other        family hx of cardiovascular disorder   Thyroid disease Daughter    Breast cancer Neg Hx    Colon cancer Neg Hx    Anesthesia problems Neg Hx    Hypotension Neg Hx    Malignant hyperthermia Neg Hx    Pseudochol deficiency Neg Hx    Colon polyps Neg Hx    Esophageal cancer Neg Hx    Rectal cancer Neg Hx    Stomach cancer Neg Hx      Review of Systems: Review of Systems  Constitutional: Positive for malaise/fatigue and weight loss. Negative for fever.  Respiratory: Positive for cough, shortness of breath and wheezing.   Cardiovascular: Positive for chest pain and palpitations. Negative for leg swelling.  Gastrointestinal: Negative for abdominal pain and blood in stool.  Genitourinary: Positive for dysuria.  Musculoskeletal: Negative for myalgias.  Skin: Negative for rash.  Neurological: Positive for dizziness. Negative for headaches.  Endo/Heme/Allergies: Does not bruise/bleed easily.  Psychiatric/Behavioral: Positive for depression. The patient is nervous/anxious.      OBJECTIVE Physical Exam: Vitals:   09/21/20 0926  BP: (!) 172/72  Pulse: 62  Weight: 160 lb (72.6 kg)  Height: 5\' 2"  (1.575 m)    Physical Exam Constitutional:      General: She is not in acute distress. Pulmonary:     Effort: Pulmonary effort is normal.  Abdominal:     General: There is  no distension.     Palpations: Abdomen is soft.     Tenderness: There is no abdominal tenderness. There is no rebound.  Musculoskeletal:        General: No  swelling. Normal range of motion.  Skin:    General: Skin is warm and dry.     Findings: No rash.  Neurological:     Mental Status: She is alert and oriented to person, place, and time.  Psychiatric:        Mood and Affect: Mood normal.        Behavior: Behavior normal.      GU / Detailed Urogynecologic Evaluation:  Pelvic Exam: Normal external female genitalia; Bartholin's and Skene's glands normal in appearance; urethral meatus normal in appearance, no urethral masses or discharge.   CST: negative  s/p hysterectomy: Speculum exam reveals normal vaginal mucosa with  atrophy and normal vaginal cuff.  Adnexa no mass, fullness, tenderness.    Pelvic floor strength I/V, puborectalis III/V external anal sphincter II/V  Pelvic floor musculature: Right levator non-tender, Right obturator non-tender, Left levator non-tender, Left obturator non-tender  POP-Q:   POP-Q  -2                                            Aa   -2                                           Ba  -4                                              C   3                                            Gh  5                                            Pb  7                                            tvl   0                                            Ap  0                                            Bp  D     Rectal Exam:  Normal sphincter tone, moderate distal rectocele, enterocoele not present, no rectal masses, no sign of dyssynergia when asking the patient to bear down.  Post-Void Residual (PVR) by Bladder Scan: In order to evaluate bladder emptying, we discussed obtaining a postvoid residual and she agreed to this procedure.  Procedure: The ultrasound unit was placed on the patient's abdomen in the suprapubic region after the patient had voided. A PVR of 4 ml was obtained by bladder scan.  Laboratory Results: POC urine: moderate leukocytes, large blood  I  visualized the urine specimen, noting the specimen to be dark yellow  ASSESSMENT AND PLAN Ms. Breit is a 86 y.o. with:  1. Vaginal vault prolapse after hysterectomy   2. Prolapse of posterior vaginal wall   3. Urinary frequency   4. Nocturia   5. SUI (stress urinary incontinence, female)   6. Dysuria   7. Incontinence of feces, unspecified fecal incontinence type    1. Stage I anterior, Stage II posterior, Stage I apical prolapse For treatment of pelvic organ prolapse, we discussed options for management including expectant management, conservative management, and surgical management, such as Kegels, a pessary, pelvic floor physical therapy, and specific surgical procedures. - She is not interested in a pessary again. She is unsure about surgery at this time. Handouts provided on SSLF and posterior repair for her to consider. She will need urodynamic testing prior to scheduling procedure.   2. Urinary frequency/ nocturia We discussed the symptoms of overactive bladder (OAB), which include urinary urgency, urinary frequency, nocturia, with or without urge incontinence.  While we do not know the exact etiology of OAB, several treatment options exist. We discussed management including behavioral therapy (decreasing bladder irritants, urge suppression strategies, timed voids, bladder retraining), physical therapy, medication; for refractory cases posterior tibial nerve stimulation, sacral neuromodulation, and intravesical botulinum toxin injection.  - Has previously tried two medications. She is maybe interested in PTNS but will consider her options.   3.SUI For treatment of stress urinary incontinence,  non-surgical options include expectant management, weight loss, physical therapy, as well as a pessary.  Surgical options include a midurethral sling, Burch urethropexy, and transurethral injection of a bulking agent. - If she decided to have surgery for prolapse, would also be interested in a  sling concurrently. Will need urodynamic testing to confirm SUI.   4. Dysuria - POC urine concerning for UTI and she also has recurrent symptoms of burning and increased frequency. Will treat with bactrim DS x3 days. Culture sent and will confirm antibiotic choice once that returns.  - Can consider prophylactic daily antibiotic for treatment of recurrent UTI after current treatment course is completed.   4. Fecal incontinence - She states this is rare.  - Treatment options include anti-diarrhea medication (loperamide/ Imodium OTC or prescription lomotil), fiber supplements, physical therapy, and possible sacral neuromodulation or surgery.  Recommended starting fiber supplementation if this is a persistent issue.   She will notify us of what she decides for treatment.    Jaquita Folds, MD   Medical Decision Making:  - Reviewed/ ordered a clinical laboratory test - Review and summation of prior records

## 2020-09-21 ENCOUNTER — Other Ambulatory Visit: Payer: Self-pay

## 2020-09-21 ENCOUNTER — Other Ambulatory Visit: Payer: Medicare HMO | Admitting: *Deleted

## 2020-09-21 ENCOUNTER — Ambulatory Visit (INDEPENDENT_AMBULATORY_CARE_PROVIDER_SITE_OTHER): Payer: Medicare HMO | Admitting: Obstetrics and Gynecology

## 2020-09-21 ENCOUNTER — Encounter: Payer: Self-pay | Admitting: Obstetrics and Gynecology

## 2020-09-21 VITALS — BP 172/72 | HR 62 | Ht 62.0 in | Wt 160.0 lb

## 2020-09-21 DIAGNOSIS — R002 Palpitations: Secondary | ICD-10-CM | POA: Diagnosis not present

## 2020-09-21 DIAGNOSIS — K219 Gastro-esophageal reflux disease without esophagitis: Secondary | ICD-10-CM | POA: Diagnosis not present

## 2020-09-21 DIAGNOSIS — I10 Essential (primary) hypertension: Secondary | ICD-10-CM | POA: Diagnosis not present

## 2020-09-21 DIAGNOSIS — R159 Full incontinence of feces: Secondary | ICD-10-CM

## 2020-09-21 DIAGNOSIS — N993 Prolapse of vaginal vault after hysterectomy: Secondary | ICD-10-CM

## 2020-09-21 DIAGNOSIS — N816 Rectocele: Secondary | ICD-10-CM | POA: Diagnosis not present

## 2020-09-21 DIAGNOSIS — I2510 Atherosclerotic heart disease of native coronary artery without angina pectoris: Secondary | ICD-10-CM | POA: Diagnosis not present

## 2020-09-21 DIAGNOSIS — R351 Nocturia: Secondary | ICD-10-CM

## 2020-09-21 DIAGNOSIS — R350 Frequency of micturition: Secondary | ICD-10-CM

## 2020-09-21 DIAGNOSIS — R0602 Shortness of breath: Secondary | ICD-10-CM | POA: Diagnosis not present

## 2020-09-21 DIAGNOSIS — N393 Stress incontinence (female) (male): Secondary | ICD-10-CM

## 2020-09-21 DIAGNOSIS — R300 Dysuria: Secondary | ICD-10-CM

## 2020-09-21 LAB — POCT URINALYSIS DIPSTICK
Appearance: ABNORMAL
Bilirubin, UA: NEGATIVE
Glucose, UA: NEGATIVE
Nitrite, UA: NEGATIVE
Protein, UA: POSITIVE — AB
Spec Grav, UA: 1.015 (ref 1.010–1.025)
Urobilinogen, UA: 0.2 E.U./dL
pH, UA: 5.5 (ref 5.0–8.0)

## 2020-09-21 LAB — URINALYSIS, ROUTINE W REFLEX MICROSCOPIC
Bacteria, UA: NONE SEEN
Bilirubin Urine: NEGATIVE
Glucose, UA: 50 mg/dL — AB
Ketones, ur: NEGATIVE mg/dL
Nitrite: NEGATIVE
Protein, ur: 30 mg/dL — AB
RBC / HPF: >50 RBC/hpf — ABNORMAL HIGH (ref 0–5)
Specific Gravity, Urine: 1.014 (ref 1.005–1.030)
WBC, UA: >50 WBC/hpf — ABNORMAL HIGH (ref 0–5)
pH: 5 (ref 5.0–8.0)

## 2020-09-21 LAB — BASIC METABOLIC PANEL
BUN/Creatinine Ratio: 29 — ABNORMAL HIGH (ref 12–28)
BUN: 27 mg/dL (ref 8–27)
CO2: 20 mmol/L (ref 20–29)
Calcium: 9.7 mg/dL (ref 8.7–10.3)
Chloride: 98 mmol/L (ref 96–106)
Creatinine, Ser: 0.92 mg/dL (ref 0.57–1.00)
Glucose: 158 mg/dL — ABNORMAL HIGH (ref 65–99)
Potassium: 4.7 mmol/L (ref 3.5–5.2)
Sodium: 137 mmol/L (ref 134–144)
eGFR: 62 mL/min/{1.73_m2} (ref >59)

## 2020-09-21 MED ORDER — SULFAMETHOXAZOLE-TRIMETHOPRIM 800-160 MG PO TABS
1.0000 | ORAL_TABLET | Freq: Two times a day (BID) | ORAL | 0 refills | Status: AC
Start: 2020-09-21 — End: 2020-09-24

## 2020-09-21 NOTE — Patient Instructions (Addendum)
Accidental Bowel Leakage: Our goal is to achieve formed bowel movements daily or every-other-day without leakage.  You may need to try different combinations of the following options to find what works best for you.  Some management options include: Marland Kitchen Dietary changes (more leafy greens, vegetables and fruits; less processed foods) . Fiber supplementation (Metamucil or something with psyllium as active ingredient) . Over-the-counter imodium (tablets or liquid) to help solidify the stool and prevent leakage of stool. . If you get constipated you can use Miralax as needed to achieve bowel movements.   We discussed the symptoms of overactive bladder (OAB), which include urinary urgency, urinary frequency, night-time urination, with or without urge incontinence.  We discussed management including behavioral therapy (decreasing bladder irritants by following a bladder diet, urge suppression strategies, timed voids, bladder retraining), physical therapy, medication; and for refractory cases posterior tibial nerve stimulation, sacral neuromodulation, and intravesical botulinum toxin injection.   You have a stage 2 (out of 4) prolapse.  We discussed the fact that it is not life threatening but there are several treatment options. For treatment of pelvic organ prolapse, we discussed options for management including expectant management, conservative management, and surgical management, such as Kegels, a pessary, pelvic floor physical therapy, and specific surgical procedures.    For treatment of stress urinary incontinence, which is leakage with physical activity/movement/strainging/coughing, we discussed expectant management versus nonsurgical options versus surgery. Nonsurgical options include weight loss, physical therapy, as well as a pessary.  Surgical options include a midurethral sling, which is a synthetic mesh sling that acts like a hammock under the urethra to prevent leakage of urine, a Burch  urethropexy, and transurethral injection of a bulking agent.

## 2020-09-22 ENCOUNTER — Other Ambulatory Visit: Payer: Self-pay | Admitting: Family Medicine

## 2020-09-22 DIAGNOSIS — G2581 Restless legs syndrome: Secondary | ICD-10-CM

## 2020-09-26 ENCOUNTER — Telehealth: Payer: Self-pay | Admitting: Obstetrics and Gynecology

## 2020-09-26 DIAGNOSIS — N3000 Acute cystitis without hematuria: Secondary | ICD-10-CM

## 2020-09-26 MED ORDER — CIPROFLOXACIN HCL 500 MG PO TABS: 500.0000 mg | ORAL_TABLET | Freq: Two times a day (BID) | ORAL | 0 refills | Status: AC

## 2020-09-26 NOTE — Telephone Encounter (Signed)
Heather [name] is a 86 y.o. female has been notified and will pick up her medication today. Pt has no additional questions.

## 2020-09-26 NOTE — Telephone Encounter (Signed)
Urine culture returned with 20-50,000 pseudomonas. Since patient was symptomatic, will change antibiotics to ciprofloxacin 500 BID x5 days based on sensitivities. This was sent to [facility] on file. Please notify patient.

## 2020-09-28 ENCOUNTER — Other Ambulatory Visit: Payer: Self-pay

## 2020-09-28 ENCOUNTER — Other Ambulatory Visit: Payer: Medicare HMO | Admitting: *Deleted

## 2020-09-28 DIAGNOSIS — K219 Gastro-esophageal reflux disease without esophagitis: Secondary | ICD-10-CM

## 2020-09-28 DIAGNOSIS — R002 Palpitations: Secondary | ICD-10-CM

## 2020-09-28 DIAGNOSIS — I10 Essential (primary) hypertension: Secondary | ICD-10-CM

## 2020-09-28 DIAGNOSIS — I2510 Atherosclerotic heart disease of native coronary artery without angina pectoris: Secondary | ICD-10-CM

## 2020-09-28 DIAGNOSIS — R0602 Shortness of breath: Secondary | ICD-10-CM | POA: Diagnosis not present

## 2020-09-28 LAB — BASIC METABOLIC PANEL
BUN/Creatinine Ratio: 25 (ref 12–28)
BUN: 30 mg/dL — ABNORMAL HIGH (ref 8–27)
CO2: 21 mmol/L (ref 20–29)
Calcium: 9.4 mg/dL (ref 8.7–10.3)
Chloride: 101 mmol/L (ref 96–106)
Creatinine, Ser: 1.21 mg/dL — ABNORMAL HIGH (ref 0.57–1.00)
Glucose: 110 mg/dL — ABNORMAL HIGH (ref 65–99)
Potassium: 5.2 mmol/L (ref 3.5–5.2)
Sodium: 136 mmol/L (ref 134–144)
eGFR: 44 mL/min/{1.73_m2} — ABNORMAL LOW (ref >59)

## 2020-09-29 ENCOUNTER — Ambulatory Visit: Payer: Medicare HMO

## 2020-09-29 ENCOUNTER — Telehealth: Payer: Self-pay | Admitting: Nurse Practitioner

## 2020-09-29 DIAGNOSIS — I10 Essential (primary) hypertension: Secondary | ICD-10-CM

## 2020-09-29 MED ORDER — SPIRONOLACTONE 25 MG PO TABS: 12.5000 mg | ORAL_TABLET | ORAL | 3 refills | Status: DC

## 2020-09-29 NOTE — Telephone Encounter (Signed)
-----   Message from Liliane Shi, [place] sent at 09/28/2020  5:28 PM EDT ----- Creatinine increased some.  Potassium high normal. PLAN:  -Change spironolactone to every Monday, Wednesday, Friday only -Repeat BMET Monday, 10/03/2020 [name], PA-C    09/28/2020 5:25 PM

## 2020-09-29 NOTE — Telephone Encounter (Signed)
Reviewed results and plan of care with patient who verbalized understanding and agreement. She is scheduled for lab appointment on Monday and is aware to reduce spironolactone to 3 days per week. She states she has always had a low potassium level and takes a supplement. I advised her that reducing the spironolactone will likely help with the higher K+ reading and that we will recheck on Monday. She agrees to monitor dietary intake of K+ as well. She thanked me for the call.

## 2020-10-03 ENCOUNTER — Other Ambulatory Visit: Payer: Medicare HMO | Admitting: *Deleted

## 2020-10-03 ENCOUNTER — Telehealth: Payer: Self-pay | Admitting: Obstetrics and Gynecology

## 2020-10-03 ENCOUNTER — Other Ambulatory Visit: Payer: Self-pay

## 2020-10-03 DIAGNOSIS — I10 Essential (primary) hypertension: Secondary | ICD-10-CM | POA: Diagnosis not present

## 2020-10-03 LAB — BASIC METABOLIC PANEL
BUN/Creatinine Ratio: 24 (ref 12–28)
BUN: 23 mg/dL (ref 8–27)
CO2: 21 mmol/L (ref 20–29)
Calcium: 9.4 mg/dL (ref 8.7–10.3)
Chloride: 102 mmol/L (ref 96–106)
Creatinine, Ser: 0.96 mg/dL (ref 0.57–1.00)
Glucose: 148 mg/dL — ABNORMAL HIGH (ref 65–99)
Potassium: 4.5 mmol/L (ref 3.5–5.2)
Sodium: 139 mmol/L (ref 134–144)
eGFR: 59 mL/min/{1.73_m2} — ABNORMAL LOW (ref >59)

## 2020-10-03 NOTE — Telephone Encounter (Signed)
chro 

## 2020-10-03 NOTE — Telephone Encounter (Signed)
Patient needs to come in and submit another urine sample for analysis since she has been treated already twice with antibiotics. Please also schedule her for urodynamic testing.

## 2020-10-06 ENCOUNTER — Encounter: Payer: Self-pay | Admitting: Obstetrics and Gynecology

## 2020-10-06 ENCOUNTER — Ambulatory Visit (INDEPENDENT_AMBULATORY_CARE_PROVIDER_SITE_OTHER): Payer: Medicare HMO | Admitting: Obstetrics and Gynecology

## 2020-10-06 VITALS — BP 164/73 | HR 60 | Wt 160.0 lb

## 2020-10-06 DIAGNOSIS — R300 Dysuria: Secondary | ICD-10-CM

## 2020-10-06 DIAGNOSIS — N393 Stress incontinence (female) (male): Secondary | ICD-10-CM

## 2020-10-06 DIAGNOSIS — R350 Frequency of micturition: Secondary | ICD-10-CM

## 2020-10-06 DIAGNOSIS — N3941 Urge incontinence: Secondary | ICD-10-CM

## 2020-10-06 LAB — POCT URINALYSIS DIPSTICK
Appearance: NORMAL
Bilirubin, UA: NEGATIVE
Blood, UA: NEGATIVE
Glucose, UA: NEGATIVE
Ketones, UA: NEGATIVE
Leukocytes, UA: NEGATIVE
Nitrite, UA: NEGATIVE
Protein, UA: NEGATIVE
Spec Grav, UA: 1.02 (ref 1.010–1.025)
Urobilinogen, UA: 0.2 E.U./dL
pH, UA: 5 (ref 5.0–8.0)

## 2020-10-06 NOTE — Progress Notes (Signed)
[facility] Urodynamics Procedure  Referring Physician: Mosie Lukes, MD Date of Procedure: 10/06/2020  Heather [name] is a 86 y.o. female who presents for urodynamic evaluation. Indication(s) for study: mixed incontinence  Vital Signs: BP (!) 164/73   Pulse 60   Wt 160 lb (72.6 kg)   LMP  (LMP Unknown)   BMI 29.26 kg/m   Laboratory Results: A catheterized urine specimen revealed:  POC urine: negative  Patient complaining of dysuria despite clear POC urine. Therefore will send urine for culture.   Voiding Diary: Not performed.   Procedure Timeout:  The correct patient was verified and the correct procedure was verified. The patient was in the correct position and safety precautions were reviewed based on at the patient's history.  Urodynamic Procedure A 16F dual lumen urodynamics catheter was placed under sterile conditions into the patient's bladder. A 16F catheter was placed into the rectum in order to measure abdominal pressure. EMG patches were placed in the appropriate position.  All connections were confirmed and calibrations/adjusted made. Saline was instilled into the bladder through the dual lumen catheters.  Cough/valsalva pressures were measured periodically during filling.  Patient was allowed to void.  The bladder was then emptied of its residual.  UROFLOW: Revealed a Qmax of 4.1 mL/sec.  She voided 23.8 mL and had a residual of 5 mL.  It was a normal pattern and represented normal habits though interpretation limited due to low voided volume.  CMG: This was performed with sterile water in the sitting position at a fill rate of 30 mL/min. Fill rate was decreased to 20 mL/ min due to urgency.    First sensation of fullness was 56 mLs,  First urge was 87 mLs,  Strong urge was 99 mLs and  Capacity was 178 mLs  Stress incontinence was demonstrated Highest negative Barrier CLPP was 103 cmH20 at 179 ml. Lowest positive Barrier VLPP was 41 cmH20 at 179  ml.  Detrusor function was overactive, with one phasic contraction seen.  This occurred at 82 mL to 2 cm of water and was associated with urge but no leakage.  Compliance:  normal. End fill detrusor pressure was 0cmH20.  Calculated compliance was 53mL/cmH20  UPP: MUCP with barrier reduction was 33 cm of water.    MICTURITION STUDY: Voiding was performed with reduction using scopettes in the sitting position.  Pdet at Qmax was 5 cm of water.  Qmax was 7.7 mL/sec.  It was a interrupted pattern.  She voided 98 mL and had a residual of 78 mL.  It was a volitional void, sustained detrusor contraction was present and abdominal straining was present  EMG: This was performed with patches.  She had voluntary contractions, recruitment with fill was present and urethral sphincter was intermittently relaxed with void.  The details of the procedure with the study tracings have been scanned into EPIC.   Urodynamic Impression:  1. Sensation was increased; capacity was reduced 2. Stress Incontinence was demonstrated at low normal pressures; 3. Detrusor Overactivity was demonstrated without leakage. 4. Emptying was normal with a normal PVR, a sustained detrusor contraction present,  abdominal straining present, normal urethral sphincter activity on EMG.  Plan: - The patient will follow up  to discuss the findings and treatment options.   Jaquita Folds, MD

## 2020-10-06 NOTE — Patient Instructions (Signed)
Taking Care of Yourself after Urodynamics, Cystoscopy, Coaptite Injection, or Botox Injection   Drink plenty of water for a day or two following your procedure. Try to have about 8 ounces (one cup) at a time, and do this 6 times or more per day unless you have fluid restrictitons AVOID irritative beverages such as coffee, tea, soda, alcoholic or citrus drinks for a day or two, as this may cause burning with urination.  For the first 1-2 days after the procedure, your urine may be pink or red in color. You may have some blood in your urine as a normal side effect of the procedure. Large amounts of bleeding or difficulty urinating are NOT normal. Call the nurse line if this happens or go to the nearest Emergency Room if the bleeding is heavy or you cannot urinate at all and it is after hours. If you had a Coaptite injection in the urethra and need to be catheterized afterward, ask for a pediatric catheter to be used (size 10 or 12-French) so the Coaptite material is not pushed out of place.   You may experience some discomfort or a burning sensation with urination after having this procedure. You can use over the counter Azo or pyridium to help with burning and follow the instructions on the packaging. If it does not improve within 1-2 days, or other symptoms appear (fever, chills, or difficulty urinating) call the office to speak to a nurse.  You may return to normal daily activities such as work, school, driving, exercising and housework on the day of the procedure. If your doctor gave you a prescription, take it as ordered.  If you need a return appointment, the front desk staff will arrange it when you check out.   

## 2020-10-08 ENCOUNTER — Encounter: Payer: Self-pay | Admitting: Family Medicine

## 2020-10-08 LAB — URINE CULTURE

## 2020-10-10 ENCOUNTER — Encounter: Payer: Self-pay | Admitting: Obstetrics and Gynecology

## 2020-10-10 NOTE — Progress Notes (Signed)
[facility] Return Visit  SUBJECTIVE  History of Present Illness: Heather [name] is a 86 y.o. female seen in follow-up after urodynamic testing. She is interested in proceeding with surgery.   Urodynamic Impression:  1. Sensation was increased; capacity was reduced 2. Stress Incontinence was demonstrated at low normal pressures; 3. Detrusor Overactivity was demonstrated without leakage. 4. Emptying was normal with a normal PVR, a sustained detrusor contraction present,  abdominal straining present, normal urethral sphincter activity on EMG.   Past Medical History: Patient  has a past medical history of Cancer [facility]), Coronary artery disease (cardiologist--- dr Burt Knack), Diabetes mellitus type 2, diet-controlled ([place]), Echocardiogram 08/2020, GERD (gastroesophageal reflux disease), Hiatal hernia, History of cervical cancer, History of DVT of lower extremity (2016), History of esophageal stricture, History of gastric ulcer (2005 approx.), History of palpitations (2010), History of TIA (transient ischemic attack) (06/1999), Hypertension, IT band syndrome, Migraine, Mixed hyperlipidemia, Multiple thyroid nodules, OA (osteoarthritis), Occasional tremors, Osteoporosis, Pelvic relaxation, 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   Past Surgical History: She  has a past surgical history that includes Knee arthroscopy (Bilateral, right ?/   left x2 , last one 09-12-2009 @ [facility]); Foot surgery (Left, 1990s); Upper gastrointestinal endoscopy (last one 04-25-2017); Total knee arthroplasty (11/12/2011); Total knee arthroplasty (Right, 11/29/2014); Colonoscopy (last one ?); Cataract extraction w/ intraocular lens  implant, bilateral (2015); Hiatal hernia repair (1989); Anterior and posterior  repair (N/A, 12/22/2019); Vaginal hysterectomy (1988); Coronary angioplasty with stent (03-16-2005   dr Lia Foyer); Coronary angioplasty with stent (06-27-2005  dr Lia Foyer); and Rectocele repair (N/A, 04/20/2020).   Medications: She has a current medication list which includes the following prescription(s): acetaminophen, albuterol, amlodipine, vitamin c, aspirin ec, true metrix air glucose meter, vitamin d, famotidine, true metrix blood glucose test, hyoscyamine, isosorbide mononitrate, losartan, magnesium oxide, metoprolol succinate, nitroglycerin, omeprazole, ondansetron, OVER THE COUNTER MEDICATION, potassium chloride sa, pramipexole, simvastatin, spironolactone, tramadol, and trueplus lancets 33g.   Allergies: Patient is allergic to baclofen and nitrofurantoin.   Social History: Patient  reports that she has never smoked. She has never used smokeless tobacco. She reports that she does not drink alcohol and does not use drugs.      OBJECTIVE     Physical Exam: Vitals:   10/11/20 1002  BP: (!) 169/74  Pulse: 71  Weight: 160 lb (72.6 kg)  Height: 5\' 2"  (1.575 m)   Gen: No apparent distress, A&O x 3.  Detailed Urogynecologic Evaluation:  Deferred. Prior exam showed :  POP-Q (09/21/20):   POP-Q  -2                                            Aa   -2                                           Ba  -4  C   3                                            Gh  5                                            Pb  7                                            tvl   0                                            Ap  0                                            Bp                                                 D      ASSESSMENT AND PLAN    Heather [name] is a 86 y.o. with:  1. Prolapse of posterior vaginal wall   2. Vaginal vault prolapse after hysterectomy   3. SUI (stress urinary incontinence, female)   4. Urge incontinence     We reviewed the results of urodynamic testing. She is interested in concomitant sling for treatment of stress incontinence. We discussed this will not treat her OAB symptoms. She is potentially interested in PTNS after surgery if OAB is still bothersome.   Plan for surgery: Exam under anesthesia, posterior repair, sacrospinous ligament fixation, midurethral sling and cystoscopy.   - We reviewed the patient's specific anatomic and functional findings, with the assistance of diagrams, and together finalized the above procedure. The planned surgical procedures were discussed along with the surgical risks outlined below, which were also provided on a detailed handout. Additional treatment options including expectant management, conservative management, medical management were discussed where appropriate.  We reviewed the benefits and risks of each treatment option.   General Surgical Risks: For all procedures, there are risks of bleeding, infection, damage to surrounding organs including but not limited to bowel, bladder, blood vessels, ureters and nerves, and need for further surgery if an injury were to occur. These risks are all low with minimally invasive surgery.   There are risks of numbness and weakness at any body site or buttock/rectal pain.  It is possible that baseline pain can be worsened by surgery, either with or without mesh. If surgery is vaginal, there is also a low risk of possible conversion to laparoscopy or open abdominal incision where indicated. Very rare risks include blood transfusion, blood clot, heart attack, pneumonia, or death.   There is also a risk of short-term postoperative urinary retention with need to use a catheter. About half of patients need to go  home from surgery with a catheter, which is then later removed in the office. The risk of long-term need for a catheter is very low. There is also a risk of worsening of overactive bladder.   Sling: The effectiveness of  a midurethral vaginal mesh sling is approximately 85%, and thus, there will be times when you may leak urine after surgery, especially if your bladder is full or if you have a strong cough. There is a balance between making the sling tight enough to treat your leakage but not too tight so that you have long-term difficulty emptying your bladder. A mesh sling will not directly treat overactive bladder/urge incontinence and may worsen it.  There is an FDA safety notification on vaginal mesh procedures for prolapse but NOT mesh slings. We have extensive experience and training with mesh placement and we have close postoperative follow up to identify any potential complications from mesh. It is important to realize that this mesh is a permanent implant that cannot be easily removed. There are rare risks of mesh exposure (2-4%), pain with intercourse (0-7%), and infection (<1%). The risk of mesh exposure if more likely in a woman with risks for poor healing (prior radiation, poorly controlled diabetes, or immunocompromised). The risk of new or worsened chronic pain after mesh implant is more common in women with baseline chronic pain and/or poorly controlled anxiety or depression. Approximately 2-4% of patients will experience longer-term post-operative voiding dysfunction that may require surgical revision of the sling. We also reviewed that postoperatively, her stream may not be as strong as before surgery.      - For preop Visit:  She is required to have a visit within 30 days of her surgery.   Today we reviewed pre-operative preparation, peri-operative expectations, and post-operative instructions/recovery.  She was provided with instructional handouts. She understands not to take aspirin (>81mg ) or NSAIDs 7 days prior to surgery. Prescriptions will be provided for: Tramadol 50mg  and Miralax. She has tylenol at home and prefers not to take ibuprofen due to her stomach sensitives. These prescriptions will be sent  prior to surgery.  - Medical clearance: required Letter sent to PCP requesting risk stratification and medical optimization on 3/31 and PCP requested cardiac clearance. Letter sent to [facility] for clearance on 4/4. - Anticoagulant use: No - Medicaid Hysterectomy form: No - Accepts blood transfusion: Yes- will get type and screen prior to surgery.  - Expected length of stay: outpatient  Request sent for surgery scheduling.   Jaquita Folds, MD   Time spent: I spent 30 minutes dedicated to the care of this patient on the date of this encounter to include pre-visit review of records, face-to-face time with the patient discussing results of testing and plan for surgery and post visit documentation and ordering medication/ testing.

## 2020-10-11 ENCOUNTER — Telehealth: Payer: Self-pay | Admitting: *Deleted

## 2020-10-11 ENCOUNTER — Ambulatory Visit (INDEPENDENT_AMBULATORY_CARE_PROVIDER_SITE_OTHER): Payer: Medicare HMO | Admitting: Obstetrics and Gynecology

## 2020-10-11 ENCOUNTER — Encounter: Payer: Self-pay | Admitting: Obstetrics and Gynecology

## 2020-10-11 ENCOUNTER — Other Ambulatory Visit: Payer: Self-pay

## 2020-10-11 VITALS — BP 169/74 | HR 71 | Ht 62.0 in | Wt 160.0 lb

## 2020-10-11 DIAGNOSIS — N3941 Urge incontinence: Secondary | ICD-10-CM | POA: Diagnosis not present

## 2020-10-11 DIAGNOSIS — N816 Rectocele: Secondary | ICD-10-CM

## 2020-10-11 DIAGNOSIS — N993 Prolapse of vaginal vault after hysterectomy: Secondary | ICD-10-CM | POA: Diagnosis not present

## 2020-10-11 DIAGNOSIS — N393 Stress incontinence (female) (male): Secondary | ICD-10-CM

## 2020-10-11 NOTE — H&P (Signed)
[facility] Pre-Operative H&P  Subjective Chief Complaint: Heather [name] presents for a preoperative encounter.   History of Present Illness: Heather [name] is a 86 y.o. female who presents for preoperative visit.  She is scheduled to undergo Exam under anesthesia, posterior repair, sacrospinous ligament fixation, midurethral sling and cystoscopy on 10/27/20.  Her symptoms include vaginal bulge and incontinence, and she was was found to have Stage I anterior, Stage II posterior, Stage I apical prolapse .   Urodynamics showed: 1. Sensation wasincreased; capacity wasreduced 2. Stress Incontinencewasdemonstrated at lownormalpressures; 3. Detrusor Overactivitywasdemonstrated withoutleakage. 4. Emptying wasnormalwith a normalPVR, a sustained detrusor contraction present, abdominal straining present,normalurethral sphincter activity on EMG.  Past Medical History:  Diagnosis Date  . Cancer [facility])    cervical cancer after hysterectomy  . Coronary artery disease cardiologist--- dr Burt Knack   hx NSTEMI  w/ cardiac cath 03-16-2005 PCI with DES to LCx;   cardiac cath 06-27-2005  PCI with DES to RCA with residual dx LAD manage medcially;  myoview/ lexiscan 01-26-20211 normal no ischemia, ef 70%;  stress echo w/ dobutamine 10-24-2011 negative ishcmeia , normal ef // Myoview 2/22: EF 67, no ischemia or infarction, no TID, low risk   . Diabetes mellitus type 2, diet-controlled ([place])    followed by pcp  diet controlled does not check cbg  . Echocardiogram 08/2020    Echocardiogram 2/22: EF 55-60, no RWMA, mild LVH, Gr 2 DD, GLS-21.7%, normal RVSF, trivial MR, RVSP 39.5  . GERD (gastroesophageal reflux disease)   . Hiatal hernia    recurrence,  hx HH repair 1989  . History of cervical cancer    areas burned off   . History of DVT of lower extremity 2016   11-29-2014 post op right TKA of right lower extremity and completed xarelto   . History of esophageal stricture    hx  s/p dilatation's  . History of gastric ulcer 2005 approx.  Marland Kitchen History of palpitations 2010   event monitor 07-07-2009 showed NSR w/ freq. SVT ectopies with short runs, rare PVCs  . History of TIA (transient ischemic attack) 06/1999   12-15-2019  per pt had several TIA between 12/ 2000 to 02/ 2001 , was sent to specialist '@[facility]' , had test that was normal and pt stated no TIAs since but has residual of essential tremors of right arm/ hand  . Hypertension   . IT band syndrome   . Migraine    "ice pick headche lasts about 30 seconds"  . Mixed hyperlipidemia   . Multiple thyroid nodules    followed by pcp---   ultrasound 11-22-2014 no bx   (12-15-2019 per pt had a endocrinologist and was told did not need bx)  . OA (osteoarthritis)    knees, elbow, hip, ankles  . Occasional tremors    right arm/ hand  s/p TIA residual 2000  . Osteoporosis    taking vitamin d  . Pelvic relaxation   . 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 Right bundle branch block (RBBB) with left anterior fascicular block (LAFB)   . RLS (restless legs syndrome)   . S/P drug eluting coronary stent placement 2006   03-16-2005  PCI x1 DES to LCx;   06-27-2005  PCI x1 DES to RCA  . SUI (stress urinary incontinence, female)      Past Surgical History:  Procedure Laterality Date  . ANTERIOR AND POSTERIOR REPAIR N/A 12/22/2019   Procedure: ANTERIOR (CYSTOCELE)  REPAIR;  Surgeon: Janyth Contes, MD;  Location: [facility] [facility];  Service: Gynecology;  Laterality: N/A;  . CATARACT EXTRACTION W/ INTRAOCULAR LENS  IMPLANT, BILATERAL  2015  . COLONOSCOPY  last one ?  . CORONARY ANGIOPLASTY WITH STENT PLACEMENT  03-16-2005   dr Lia Foyer   PCI and DES x1 to LCx  . CORONARY ANGIOPLASTY WITH STENT PLACEMENT  06-27-2005  dr Lia Foyer   PCI and DES x1 to RCA with residual disease LAD 70-80% to manage medically  . FOOT SURGERY Left 1990s   left foot stress fracture repair, per pt no hardware  . HIATAL HERNIA REPAIR   1989  . KNEE ARTHROSCOPY Bilateral right ?/   left x2 , last one 09-12-2009 @ [place]  . RECTOCELE REPAIR N/A 04/20/2020   Procedure: POSTERIOR REPAIR (RECTOCELE);  Surgeon: Janyth Contes, MD;  Location[facility];  Service: Gynecology;  Laterality: N/A;  . TOTAL KNEE ARTHROPLASTY  11/12/2011   Procedure: TOTAL KNEE ARTHROPLASTY;  Surgeon: Lorn Junes, MD;  Location: [facility];  Service: Orthopedics;  Laterality: Left;  Dr Noemi Chapel wants 90 minutes for this case  . TOTAL KNEE ARTHROPLASTY Right 11/29/2014   Procedure: RIGHT TOTAL KNEE ARTHROPLASTY;  Surgeon: Vickey Huger, MD;  Location: [facility];  Service: Orthopedics;  Laterality: Right;  . UPPER GASTROINTESTINAL ENDOSCOPY  last one 04-25-2017   with dilatation esophageal stricture and savary dilatation  . VAGINAL HYSTERECTOMY  1988    no ovaries removed for bleeeding    is allergic to baclofen and nitrofurantoin.   Family History  Problem Relation Age of Onset  . Stroke Father        family hx of M 1st degree relative <50  . Coronary artery disease Mother   . Heart disease Mother   . Depression Brother   . Stroke Brother   . Diabetes Brother   . Cancer Brother        bladder with mets  . Diabetes Daughter        borderline  . Hypertension Daughter   . Arthritis Other        family hx of  . Hypertension Other        family hx of  . Other Other        family hx of cardiovascular disorder  . Thyroid disease Daughter   . Breast cancer Neg Hx   . Colon cancer Neg Hx   . Anesthesia problems Neg Hx   . Hypotension Neg Hx   . Malignant hyperthermia Neg Hx   . Pseudochol deficiency Neg Hx   . Colon polyps Neg Hx   . Esophageal cancer Neg Hx   . Rectal cancer Neg Hx   . Stomach cancer Neg Hx     Social History   Tobacco Use  . Smoking status: Never Smoker  . Smokeless tobacco: Never Used  Vaping Use  . Vaping Use: Never used  Substance Use Topics  . Alcohol use: No    Alcohol/week: 0.0 standard drinks   . Drug use: Never     Review of Systems was negative for a full 10 system review except as noted in the History of Present Illness.  No current facility-administered medications for this encounter.  Current Outpatient Medications:  .  acetaminophen (TYLENOL) 500 MG tablet, Take 2 tablets (1,000 mg total) by mouth every 6 (six) hours as needed for mild pain., Disp: 30 tablet, Rfl: 0 .  albuterol (VENTOLIN HFA) 108 (90 Base) MCG/ACT inhaler, Inhale 1-2 puffs into the lungs every  6 (six) hours as needed for wheezing or shortness of breath., Disp: 8 g, Rfl: 0 .  amLODipine (NORVASC) 10 MG tablet, Take 1 tablet (10 mg total) by mouth daily., Disp: 90 tablet, Rfl: 1 .  Ascorbic Acid (VITAMIN C) 500 MG CHEW, Chew by mouth daily., Disp: , Rfl:  .  aspirin EC 81 MG tablet, Take 1 tablet (81 mg total) by mouth daily., Disp: , Rfl:  .  Blood Glucose Monitoring Suppl (TRUE METRIX AIR GLUCOSE METER) w/Device KIT, USE TO CHECK BLOOD SUGAR ONCE DAILY AND AS NEEDED.  DX CODE E11.9, Disp: 1 kit, Rfl: 0 .  Cholecalciferol (VITAMIN D) 50 MCG (2000 UT) CAPS, Take 2,000 Units by mouth daily. , Disp: , Rfl:  .  famotidine (PEPCID) 20 MG tablet, Take 20 mg by mouth at bedtime as needed for heartburn or indigestion., Disp: , Rfl:  .  glucose blood (TRUE METRIX BLOOD GLUCOSE TEST) test strip, USE TO CHECK BLOOD SUGAR ONCE A DAY OR AS NEEDED.  DX CODE: E11.9, Disp: 200 each, Rfl: 1 .  hyoscyamine (LEVSIN SL) 0.125 MG SL tablet, Place 1 tablet (0.125 mg total) under the tongue every 4 (four) hours as needed., Disp: 30 tablet, Rfl: 0 .  isosorbide mononitrate (IMDUR) 60 MG 24 hr tablet, Take 1 tablet (60 mg total) by mouth daily., Disp: 90 tablet, Rfl: 3 .  losartan (COZAAR) 100 MG tablet, TAKE 1 TABLET EVERY DAY, Disp: 90 tablet, Rfl: 1 .  magnesium oxide (MAG-OX) 400 MG tablet, Take 400 mg by mouth 2 (two) times daily., Disp: , Rfl:  .  metoprolol succinate (TOPROL-XL) 50 MG 24 hr tablet, Take 1.5 tablets (75 mg  total) by mouth at bedtime. Take with or immediately following a meal., Disp: 135 tablet, Rfl: 3 .  nitroGLYCERIN (NITROSTAT) 0.4 MG SL tablet, Place 1 tablet (0.4 mg total) under the tongue every 5 (five) minutes as needed for chest pain., Disp: 25 tablet, Rfl: 11 .  omeprazole (PRILOSEC) 20 MG capsule, Take 1 capsule (20 mg total) by mouth daily as needed., Disp: 90 capsule, Rfl: 3 .  ondansetron (ZOFRAN ODT) 4 MG disintegrating tablet, Take 1 tablet (4 mg total) by mouth every 6 (six) hours as needed for nausea or vomiting., Disp: 30 tablet, Rfl: 0 .  OVER THE COUNTER MEDICATION, Zinc q day, Disp: , Rfl:  .  potassium chloride SA (KLOR-CON) 20 MEQ tablet, Take 0.5 tablets (10 mEq total) by mouth daily., Disp: 45 tablet, Rfl: 2 .  pramipexole (MIRAPEX) 0.25 MG tablet, Take 1 tablet (0.25 mg total) by mouth 2 (two) times daily., Disp: 180 tablet, Rfl: 1 .  simvastatin (ZOCOR) 20 MG tablet, TAKE 1 TABLET AT BEDTIME, Disp: 90 tablet, Rfl: 2 .  spironolactone (ALDACTONE) 25 MG tablet, Take 0.5 tablets (12.5 mg total) by mouth 3 (three) times a week., Disp: 90 tablet, Rfl: 3 .  traMADol (ULTRAM) 50 MG tablet, Take 1-2 tablets (50-100 mg total) by mouth every 6 (six) hours as needed for moderate pain., Disp: 30 tablet, Rfl: 1 .  TRUEplus Lancets 33G MISC, USE TO CHECK BLOOD SUGAR ONCE DAILY AND AS NEEDED.  DX CODE: E11.9, Disp: 200 each, Rfl: 1   Objective Gen: No distress, AAO x3  Previous Pelvic Exam showed: POP-Q (09/21/20):   POP-Q  -2 Aa  -2 Ba  -4 C   3 Gh  5 Pb  7 tvl   0 Ap  0 Bp    D  Assessment/ Plan  Assessment: The patient is a 86 y.o. year old with Stage II POP and stress incontinence.    Plan: Exam under anesthesia, posterior repair, sacrospinous ligament fixation, midurethral sling and cystoscopy    Jaquita Folds, MD

## 2020-10-11 NOTE — Telephone Encounter (Signed)
   Patient Name: Heather [name]  DOB: [birth year]  MRN: [id number]   Primary Cardiologist: [name], MD  Chart reviewed as part of pre-operative protocol coverage. Patient has been seen multiple times recently by [name], PA-C, for fatigue, shortness of breath, palpitations with associated chest pain. Work-up has been unremarkable. Echo in 08/2020 showed LVEF of 55-60% with normal wall motion and grade 2 diastolic dysfunction. Myoview in 08/2020 was low risk with no evidence of ischemia/infarction. The final read of recent Event Monitor is still pending but looks like it shows underlying sinus rhythm with PAC/PVCs. Patient was contacted today for further pre-op evaluation. She still is having intermittent palpitations but not severe lightheadedness/dizziness, near syncope, or syncope. She has some chest discomfort associated with palpitations but no chest pain outside of this. She has stable shortness of breath with activity. No shortness of breath at rest. No orthopnea or PND. Able to complete >4.0 METS without any angina. Per Revised Cardiac Risk Index, considered moderate risk with 6.6% of perioperative major cardiac event given history of CAD and CVA. Given past medical history and time since last visit, based on ACC/AHA guidelines, [place] would be at acceptable risk for the planned procedure without further cardiovascular testing.   Ideally, we recommend she remain on Aspirin without interruption. However, given her last PCI was in 2006, if the bleeding risk is too great she may hold her Aspirin 5-7 days prior to her procedure and resume it postoperatively as soon as possible.  I will route this recommendation to the requesting party via Epic fax function and remove from pre-op pool.  Please call with questions.  Darreld Mclean, PA-C 10/11/2020, 12:55 PM

## 2020-10-11 NOTE — Telephone Encounter (Signed)
   [facility] Pre-operative Risk Assessment    Patient Name: Heather [name]  DOB: June 26, 1935  MRN: [id number]   HEARTCARE STAFF: - Please ensure there is not already an duplicate clearance open for this procedure. - Under Visit Info/Reason for Call, type in Other and utilize the format Clearance MM/DD/YY or Clearance TBD. Do not use dashes or single digits. - If request is for dental extraction, please clarify the # of teeth to be extracted.  Request for surgical clearance:  1. What type of surgery is being performed? POSTERIOR REPAIR RECTOCELE  2. When is this surgery scheduled? TBD   3. What type of clearance is required (medical clearance vs. Pharmacy clearance to hold med vs. Both)? MEDICAL  4. Are there any medications that need to be held prior to surgery and how long? ASA    5. Practice name and name of physician performing surgery? [facility]; DR. Sharyn Lull SCHROEDER  6. What is the office phone number? [phone]   7.   What is the office fax number? [phone]  8.   Anesthesia type (None, local, MAC, general) ? GENERAL    Julaine Hua 10/11/2020, 10:13 AM  _________________________________________________________________   (provider comments below)

## 2020-10-19 ENCOUNTER — Other Ambulatory Visit: Payer: Self-pay | Admitting: Obstetrics and Gynecology

## 2020-10-19 ENCOUNTER — Encounter (HOSPITAL_BASED_OUTPATIENT_CLINIC_OR_DEPARTMENT_OTHER): Payer: Self-pay | Admitting: Obstetrics and Gynecology

## 2020-10-19 ENCOUNTER — Other Ambulatory Visit: Payer: Self-pay

## 2020-10-19 DIAGNOSIS — Z01818 Encounter for other preprocedural examination: Secondary | ICD-10-CM

## 2020-10-19 MED ORDER — POLYETHYLENE GLYCOL 3350 17 GM/SCOOP PO POWD: 1.0000 | Freq: Once | ORAL | 0 refills | Status: DC

## 2020-10-19 MED ORDER — TRAMADOL HCL 50 MG PO TABS: 50.0000 mg | ORAL_TABLET | Freq: Three times a day (TID) | ORAL | 0 refills | Status: AC | PRN

## 2020-10-19 NOTE — Progress Notes (Signed)
LM on the VM for the patient to call back re: msg from Dr Wannetta Sender.

## 2020-10-19 NOTE — Progress Notes (Addendum)
ADDENDUM:  Chart reviewed by anesthesia, Dr Holland Falling,  Ok to proceed barring any acute status change dos. Also, received reply back from Dr Wannetta Sender stated pt did not need to stop asa prior to surgery.  Called pt via phone she verbalized understanding to continue asa but do not take morning of surgery.   Spoke w/ via phone for pre-op interview--- PT Lab needs dos----  [place]             Lab results------ current ekg/ cxr in epic/ chart COVID test ------ 10-24-2020 @ 0930  Arrive at -------  1100 on 10-27-2020 NPO after MN NO Solid Food.  Clear liquids from MN until--- 1000 Med rec completed Medications to take morning of surgery ----- Imdur, Norvasc, Prilosec, Mirapex Diabetic medication ----- n/a  Patient instructed to bring photo id and insurance card day of surgery Patient aware to have Driver (ride ) / caregiver  for 24 hours after surgery -- daughter, Philomena Doheny Patient Special Instructions ----- n/a  Pre-Op special Istructions ----- pt has cardiac telephone clearance dated 10-11-2020 by Sande Rives PA in epic/ chart  Patient verbalized understanding of instructions that were given at this phone interview. Patient denies  chest pain, fever, cough at this phone interview.   Anesthesia Review:  HTN;  Hx CAD with NSTEMI s/p PCI and DES 09/ 2006 x1 and 12/ 2006 x1;  Hx TIA (several in 2000) pt stated none since, never found cause, but ahs residual right arm/ hand tremors;  DM2 diet controlled;  GERD;  Pt denies any peripheral swelling.  Pt stated last took one nitro last week , had chest / jaw pain, resolved after few minutes;  Pt has sob with exertion (stairs, long distance) ok with household chores.  PCP:  Dr [name] ([place] 07-14-2020 epic) Cardiologist :  Dr Burt Knack ([place] 09-14-2020 epic) Chest x-ray : 09-05-2020 epic EKG :  08-26-2020 epic Echo :  08-26-2020 epic Stress test:  08-31-2020 epic Event monitor:  10-05-2020 epic Cardiac Cath : 2006 Activity level:  See  above Sleep Study/ CPAP : NO Fasting Blood Sugar : 115--120s     / Checks Blood Sugar -- times a day:  Daily in am  Blood Thinner/ Instructions Maryjane Hurter Dose:  NO ASA / Instructions/ Last Dose : ASA 81 mg/  Per cardiac clearance prefer pt not stop prior to surgery however if surgeon felt high risk of bleeding may stop 5-7 days prior.  Pt stated was not given instructions from Dr Wannetta Sender office.  Sent inbox message in epic to  Dr Wannetta Sender that pt needs asa instructions.

## 2020-10-20 ENCOUNTER — Ambulatory Visit: Payer: Medicare HMO | Admitting: Physical Therapy

## 2020-10-24 ENCOUNTER — Other Ambulatory Visit (HOSPITAL_COMMUNITY): Payer: Medicare HMO

## 2020-10-24 ENCOUNTER — Other Ambulatory Visit (HOSPITAL_COMMUNITY)
Admission: RE | Admit: 2020-10-24 | Discharge: 2020-10-24 | Disposition: A | Discharge status: Home / Self Care | Payer: Medicare HMO | Source: Ambulatory Visit | Attending: Obstetrics and Gynecology | Admitting: Obstetrics and Gynecology

## 2020-10-24 DIAGNOSIS — Z01812 Encounter for preprocedural laboratory examination: Secondary | ICD-10-CM | POA: Insufficient documentation

## 2020-10-24 DIAGNOSIS — Z20822 Contact with and (suspected) exposure to covid-19: Secondary | ICD-10-CM | POA: Diagnosis not present

## 2020-10-25 LAB — SARS CORONAVIRUS 2 (TAT 6-24 HRS): SARS Coronavirus 2: NEGATIVE

## 2020-10-27 ENCOUNTER — Ambulatory Visit (HOSPITAL_BASED_OUTPATIENT_CLINIC_OR_DEPARTMENT_OTHER)
Admission: RE | Admit: 2020-10-27 | Discharge: 2020-10-27 | Disposition: A | Discharge status: Home / Self Care | Payer: Medicare HMO | Attending: Obstetrics and Gynecology | Admitting: Obstetrics and Gynecology

## 2020-10-27 ENCOUNTER — Ambulatory Visit (HOSPITAL_BASED_OUTPATIENT_CLINIC_OR_DEPARTMENT_OTHER): Payer: Medicare HMO | Admitting: Anesthesiology

## 2020-10-27 ENCOUNTER — Encounter (HOSPITAL_BASED_OUTPATIENT_CLINIC_OR_DEPARTMENT_OTHER): Payer: Self-pay | Admitting: Obstetrics and Gynecology

## 2020-10-27 ENCOUNTER — Other Ambulatory Visit: Payer: Self-pay

## 2020-10-27 ENCOUNTER — Encounter (HOSPITAL_BASED_OUTPATIENT_CLINIC_OR_DEPARTMENT_OTHER)
Admission: RE | Disposition: A | Discharge status: Home / Self Care | Payer: Self-pay | Source: Home / Self Care | Attending: Obstetrics and Gynecology

## 2020-10-27 ENCOUNTER — Telehealth: Payer: Self-pay | Admitting: Obstetrics and Gynecology

## 2020-10-27 DIAGNOSIS — Z8052 Family history of malignant neoplasm of bladder: Secondary | ICD-10-CM | POA: Insufficient documentation

## 2020-10-27 DIAGNOSIS — Z79899 Other long term (current) drug therapy: Secondary | ICD-10-CM | POA: Insufficient documentation

## 2020-10-27 DIAGNOSIS — N393 Stress incontinence (female) (male): Secondary | ICD-10-CM | POA: Diagnosis not present

## 2020-10-27 DIAGNOSIS — I10 Essential (primary) hypertension: Secondary | ICD-10-CM | POA: Insufficient documentation

## 2020-10-27 DIAGNOSIS — Z7982 Long term (current) use of aspirin: Secondary | ICD-10-CM | POA: Insufficient documentation

## 2020-10-27 DIAGNOSIS — I69398 Other sequelae of cerebral infarction: Secondary | ICD-10-CM | POA: Insufficient documentation

## 2020-10-27 DIAGNOSIS — Z8249 Family history of ischemic heart disease and other diseases of the circulatory system: Secondary | ICD-10-CM | POA: Insufficient documentation

## 2020-10-27 DIAGNOSIS — E876 Hypokalemia: Secondary | ICD-10-CM | POA: Diagnosis not present

## 2020-10-27 DIAGNOSIS — K219 Gastro-esophageal reflux disease without esophagitis: Secondary | ICD-10-CM | POA: Diagnosis not present

## 2020-10-27 DIAGNOSIS — Z833 Family history of diabetes mellitus: Secondary | ICD-10-CM | POA: Diagnosis not present

## 2020-10-27 DIAGNOSIS — N8112 Cystocele, lateral: Secondary | ICD-10-CM | POA: Insufficient documentation

## 2020-10-27 DIAGNOSIS — Z8541 Personal history of malignant neoplasm of cervix uteri: Secondary | ICD-10-CM | POA: Insufficient documentation

## 2020-10-27 DIAGNOSIS — Z955 Presence of coronary angioplasty implant and graft: Secondary | ICD-10-CM | POA: Diagnosis not present

## 2020-10-27 DIAGNOSIS — Z823 Family history of stroke: Secondary | ICD-10-CM | POA: Insufficient documentation

## 2020-10-27 DIAGNOSIS — N816 Rectocele: Secondary | ICD-10-CM | POA: Diagnosis not present

## 2020-10-27 DIAGNOSIS — E782 Mixed hyperlipidemia: Secondary | ICD-10-CM | POA: Diagnosis not present

## 2020-10-27 DIAGNOSIS — E119 Type 2 diabetes mellitus without complications: Secondary | ICD-10-CM | POA: Insufficient documentation

## 2020-10-27 DIAGNOSIS — Z86718 Personal history of other venous thrombosis and embolism: Secondary | ICD-10-CM | POA: Diagnosis not present

## 2020-10-27 DIAGNOSIS — Z818 Family history of other mental and behavioral disorders: Secondary | ICD-10-CM | POA: Diagnosis not present

## 2020-10-27 DIAGNOSIS — N993 Prolapse of vaginal vault after hysterectomy: Secondary | ICD-10-CM | POA: Diagnosis not present

## 2020-10-27 DIAGNOSIS — Z8261 Family history of arthritis: Secondary | ICD-10-CM | POA: Diagnosis not present

## 2020-10-27 HISTORY — DX: Other forms of dyspnea: R06.09

## 2020-10-27 HISTORY — PX: ANTERIOR AND POSTERIOR REPAIR WITH SACROSPINOUS FIXATION: SHX6536

## 2020-10-27 HISTORY — DX: Mixed incontinence: N39.46

## 2020-10-27 HISTORY — DX: Other forms of angina pectoris: I20.89

## 2020-10-27 HISTORY — DX: Palpitations: R00.2

## 2020-10-27 HISTORY — DX: Other forms of angina pectoris: I20.8

## 2020-10-27 HISTORY — PX: BLADDER SUSPENSION: SHX72

## 2020-10-27 HISTORY — PX: CYSTOSCOPY: SHX5120

## 2020-10-27 HISTORY — PX: RECTOCELE REPAIR: SHX761

## 2020-10-27 HISTORY — DX: Dyspnea, unspecified: R06.00

## 2020-10-27 LAB — POCT I-STAT, CHEM 8
BUN: 23 mg/dL (ref 8–23)
Calcium, Ion: 1.13 mmol/L — ABNORMAL LOW (ref 1.15–1.40)
Chloride: 104 mmol/L (ref 98–111)
Creatinine, Ser: 1 mg/dL (ref 0.44–1.00)
Glucose, Bld: 165 mg/dL — ABNORMAL HIGH (ref 70–99)
HCT: 41 % (ref 36.0–46.0)
Hemoglobin: 13.9 g/dL (ref 12.0–15.0)
Potassium: 4.6 mmol/L (ref 3.5–5.1)
Sodium: 136 mmol/L (ref 135–145)
TCO2: 22 mmol/L (ref 22–32)

## 2020-10-27 LAB — TYPE AND SCREEN
ABO/RH(D): O NEG
Antibody Screen: NEGATIVE

## 2020-10-27 LAB — GLUCOSE, CAPILLARY: Glucose-Capillary: 125 mg/dL — ABNORMAL HIGH (ref 70–99)

## 2020-10-27 SURGERY — COLPORRHAPHY, POSTERIOR, FOR RECTOCELE REPAIR
Anesthesia: General | Site: Vagina

## 2020-10-27 MED ORDER — EPHEDRINE SULFATE-NACL 50-0.9 MG/10ML-% IV SOSY
PREFILLED_SYRINGE | INTRAVENOUS | Status: DC | PRN
  Administered 2020-10-27: 10 mg via INTRAVENOUS

## 2020-10-27 MED ORDER — CEFAZOLIN SODIUM-DEXTROSE 2-4 GM/100ML-% IV SOLN
INTRAVENOUS | Status: AC
  Filled 2020-10-27: qty 100

## 2020-10-27 MED ORDER — LACTATED RINGERS IV SOLN: INTRAVENOUS | Status: DC

## 2020-10-27 MED ORDER — ACETAMINOPHEN 500 MG PO TABS
1000.0000 mg | ORAL_TABLET | ORAL | Status: AC
  Administered 2020-10-27: 1000 mg via ORAL

## 2020-10-27 MED ORDER — ACETAMINOPHEN 500 MG PO TABS
ORAL_TABLET | ORAL | Status: AC
  Filled 2020-10-27: qty 2

## 2020-10-27 MED ORDER — FENTANYL CITRATE (PF) 100 MCG/2ML IJ SOLN: 25.0000 ug | INTRAMUSCULAR | Status: DC | PRN

## 2020-10-27 MED ORDER — ONDANSETRON HCL 4 MG/2ML IJ SOLN
INTRAMUSCULAR | Status: DC | PRN
  Administered 2020-10-27: 4 mg via INTRAVENOUS

## 2020-10-27 MED ORDER — POVIDONE-IODINE 10 % EX SWAB: 2.0000 "application " | Freq: Once | CUTANEOUS | Status: DC

## 2020-10-27 MED ORDER — DEXAMETHASONE SODIUM PHOSPHATE 10 MG/ML IJ SOLN
INTRAMUSCULAR | Status: AC
  Filled 2020-10-27: qty 1

## 2020-10-27 MED ORDER — PROPOFOL 10 MG/ML IV BOLUS
INTRAVENOUS | Status: DC | PRN
  Administered 2020-10-27: 20 mg via INTRAVENOUS
  Administered 2020-10-27: 130 mg via INTRAVENOUS
  Administered 2020-10-27: 50 mg via INTRAVENOUS

## 2020-10-27 MED ORDER — DEXAMETHASONE SODIUM PHOSPHATE 10 MG/ML IJ SOLN
INTRAMUSCULAR | Status: DC | PRN
  Administered 2020-10-27: 5 mg via INTRAVENOUS

## 2020-10-27 MED ORDER — LIDOCAINE 2% (20 MG/ML) 5 ML SYRINGE
INTRAMUSCULAR | Status: AC
  Filled 2020-10-27: qty 5

## 2020-10-27 MED ORDER — PROPOFOL 10 MG/ML IV BOLUS
INTRAVENOUS | Status: AC
  Filled 2020-10-27: qty 20

## 2020-10-27 MED ORDER — OXYCODONE HCL 5 MG PO TABS: 5.0000 mg | ORAL_TABLET | Freq: Once | ORAL | Status: DC | PRN

## 2020-10-27 MED ORDER — KETOROLAC TROMETHAMINE 30 MG/ML IJ SOLN
30.0000 mg | Freq: Once | INTRAMUSCULAR | Status: AC
  Administered 2020-10-27: 30 mg via INTRAVENOUS

## 2020-10-27 MED ORDER — PHENAZOPYRIDINE HCL 100 MG PO TABS
200.0000 mg | ORAL_TABLET | ORAL | Status: AC
  Administered 2020-10-27: 200 mg via ORAL

## 2020-10-27 MED ORDER — 0.9 % SODIUM CHLORIDE (POUR BTL) OPTIME
TOPICAL | Status: DC | PRN
  Administered 2020-10-27: 500 mL

## 2020-10-27 MED ORDER — LIDOCAINE 2% (20 MG/ML) 5 ML SYRINGE
INTRAMUSCULAR | Status: DC | PRN
  Administered 2020-10-27: 60 mg via INTRAVENOUS

## 2020-10-27 MED ORDER — EPHEDRINE 5 MG/ML INJ
INTRAVENOUS | Status: AC
  Filled 2020-10-27: qty 10

## 2020-10-27 MED ORDER — PHENAZOPYRIDINE HCL 100 MG PO TABS
ORAL_TABLET | ORAL | Status: AC
  Filled 2020-10-27: qty 2

## 2020-10-27 MED ORDER — KETOROLAC TROMETHAMINE 30 MG/ML IJ SOLN
INTRAMUSCULAR | Status: AC
  Filled 2020-10-27: qty 1

## 2020-10-27 MED ORDER — OXYCODONE HCL 5 MG/5ML PO SOLN: 5.0000 mg | Freq: Once | ORAL | Status: DC | PRN

## 2020-10-27 MED ORDER — CEFAZOLIN SODIUM-DEXTROSE 2-4 GM/100ML-% IV SOLN
2.0000 g | INTRAVENOUS | Status: AC
  Administered 2020-10-27: 2 g via INTRAVENOUS

## 2020-10-27 MED ORDER — ONDANSETRON HCL 4 MG/2ML IJ SOLN
INTRAMUSCULAR | Status: AC
  Filled 2020-10-27: qty 2

## 2020-10-27 MED ORDER — LIDOCAINE-EPINEPHRINE 1 %-1:100000 IJ SOLN
INTRAMUSCULAR | Status: DC | PRN
  Administered 2020-10-27: 12 mL

## 2020-10-27 MED ORDER — FENTANYL CITRATE (PF) 100 MCG/2ML IJ SOLN
INTRAMUSCULAR | Status: AC
  Filled 2020-10-27: qty 2

## 2020-10-27 MED ORDER — FENTANYL CITRATE (PF) 100 MCG/2ML IJ SOLN
INTRAMUSCULAR | Status: DC | PRN
  Administered 2020-10-27 (×3): 25 ug via INTRAVENOUS

## 2020-10-27 MED ORDER — SODIUM CHLORIDE 0.9 % IR SOLN
Status: DC | PRN
  Administered 2020-10-27: 1000 mL via INTRAVESICAL

## 2020-10-27 MED ORDER — OXYCODONE HCL 5 MG PO TABS
ORAL_TABLET | ORAL | Status: AC
  Filled 2020-10-27: qty 1

## 2020-10-27 SURGICAL SUPPLY — 57 items
ADH SKN CLS APL DERMABOND .7 (GAUZE/BANDAGES/DRESSINGS) ×2
AGENT HMST KT MTR STRL THRMB (HEMOSTASIS)
APL ESCP 34 STRL LF DISP (HEMOSTASIS)
APL PRP STRL LF DISP 70% ISPRP (MISCELLANEOUS)
APPLICATOR SURGIFLO ENDO (HEMOSTASIS) IMPLANT
BLADE SURG 15 STRL LF DISP TIS (BLADE) ×2 IMPLANT
BLADE SURG 15 STRL SS (BLADE) ×3
BNDG GAUZE ELAST 4 BULKY (GAUZE/BANDAGES/DRESSINGS) IMPLANT
CANISTER SUCT 3000ML PPV (MISCELLANEOUS) ×3 IMPLANT
CHLORAPREP W/TINT 26 (MISCELLANEOUS) IMPLANT
COVER WAND RF STERILE (DRAPES) ×3 IMPLANT
DECANTER SPIKE VIAL GLASS SM (MISCELLANEOUS) ×3 IMPLANT
DERMABOND ADVANCED (GAUZE/BANDAGES/DRESSINGS) ×1
DERMABOND ADVANCED .7 DNX12 (GAUZE/BANDAGES/DRESSINGS) ×2 IMPLANT
DEVICE CAPIO SLIM BOX (INSTRUMENTS) ×1 IMPLANT
DEVICE CAPIO SLIM SINGLE (INSTRUMENTS) ×3 IMPLANT
ELECT REM PT RETURN 9FT ADLT (ELECTROSURGICAL) ×3
ELECTRODE REM PT RTRN 9FT ADLT (ELECTROSURGICAL) ×2 IMPLANT
GAUZE PACKING 2X5 YD STRL (GAUZE/BANDAGES/DRESSINGS) IMPLANT
GLOVE SURG ENC MOIS LTX SZ6 (GLOVE) ×3 IMPLANT
GLOVE SURG LTX SZ6 (GLOVE) ×3 IMPLANT
GLOVE SURG UNDER POLY LF SZ6.5 (GLOVE) ×3 IMPLANT
GLOVE SURG UNDER POLY LF SZ7 (GLOVE) ×3 IMPLANT
GOWN STRL REUS W/TWL LRG LVL3 (GOWN DISPOSABLE) ×9 IMPLANT
HIBICLENS CHG 4% 4OZ (MISCELLANEOUS) ×3 IMPLANT
KIT TURNOVER CYSTO (KITS) ×3 IMPLANT
MANIFOLD NEPTUNE II (INSTRUMENTS) ×3 IMPLANT
NDL MAYO 6 CRC TAPER PT (NEEDLE) IMPLANT
NEEDLE HYPO 22GX1.5 SAFETY (NEEDLE) ×3 IMPLANT
NEEDLE MAYO 6 CRC TAPER PT (NEEDLE) ×3 IMPLANT
NS IRRIG 1000ML POUR BTL (IV SOLUTION) ×3 IMPLANT
PACK CYSTO (CUSTOM PROCEDURE TRAY) ×3 IMPLANT
PACK VAGINAL WOMENS (CUSTOM PROCEDURE TRAY) ×3 IMPLANT
PENCIL BUTTON HOLSTER BLD 10FT (ELECTRODE) ×3 IMPLANT
RETRACTOR LONE STAR DISPOSABLE (INSTRUMENTS) ×3 IMPLANT
RETRACTOR STAY HOOK 5MM (MISCELLANEOUS) ×1 IMPLANT
SET IRRIG Y TYPE TUR BLADDER L (SET/KITS/TRAYS/PACK) ×3 IMPLANT
SURGIFLO W/THROMBIN 8M KIT (HEMOSTASIS) IMPLANT
SUT ABS MONO DBL WITH NDL 48IN (SUTURE) IMPLANT
SUT CAPIO PGA 48IN SZ 0 (SUTURE) ×3 IMPLANT
SUT CV-0 GORETEX TFX25 36 (SUTURE) ×2 IMPLANT
SUT MON AB 2-0 SH 27 (SUTURE) IMPLANT
SUT PDS PLUS 0 (SUTURE)
SUT PDS PLUS AB 0 CT-2 (SUTURE) IMPLANT
SUT VIC AB 0 CT1 27 (SUTURE) ×6
SUT VIC AB 0 CT1 27XBRD ANTBC (SUTURE) ×2 IMPLANT
SUT VIC AB 2-0 SH 27 (SUTURE)
SUT VIC AB 2-0 SH 27XBRD (SUTURE) IMPLANT
SUT VIC AB 3-0 SH 18 (SUTURE) IMPLANT
SUT VICRYL 2-0 SH 8X27 (SUTURE) ×4 IMPLANT
SYR BULB EAR ULCER 3OZ GRN STR (SYRINGE) ×3 IMPLANT
SYS SLING ADV FIT BLUE TRNSVAG (Sling) ×1 IMPLANT
TOWEL OR 17X26 10 PK STRL BLUE (TOWEL DISPOSABLE) ×3 IMPLANT
TRAY FOL W/BAG SLVR 16FR STRL (SET/KITS/TRAYS/PACK) ×2 IMPLANT
TRAY FOLEY W/BAG SLVR 14FR (SET/KITS/TRAYS/PACK) ×3 IMPLANT
TRAY FOLEY W/BAG SLVR 14FR LF (SET/KITS/TRAYS/PACK) IMPLANT
TRAY FOLEY W/BAG SLVR 16FR LF (SET/KITS/TRAYS/PACK) ×3

## 2020-10-27 NOTE — Anesthesia Postprocedure Evaluation (Signed)
Anesthesia Post Note  Patient: Heather [name]  Procedure(s) Performed: POSTERIOR REPAIR (RECTOCELE) (N/A Vagina ) SACROSPINOUS LIGAMENT FIXATION (N/A Vagina ) TRANSVAGINAL TAPE (TVT) PROCEDURE (N/A Vagina ) CYSTOSCOPY (N/A Bladder)     Patient location during evaluation: PACU Anesthesia Type: General Level of consciousness: awake Pain management: pain level controlled Vital Signs Assessment: post-procedure vital signs reviewed and stable Respiratory status: spontaneous breathing Cardiovascular status: stable Postop Assessment: no apparent nausea or vomiting Anesthetic complications: no   No complications documented.  Last Vitals:  Vitals:   10/27/20 1430 10/27/20 1445  BP: (!) 123/110 (!) 140/53  Pulse: 60 (!) 54  Resp: 16 10  Temp:    SpO2: 96% 96%    Last Pain:  Vitals:   10/27/20 1430  TempSrc:   PainSc: 0-No pain                 Keller Mikels

## 2020-10-27 NOTE — Anesthesia Preprocedure Evaluation (Addendum)
Anesthesia Evaluation  Patient identified by MRN, date of birth, ID band Patient awake    Reviewed: Allergy & Precautions, NPO status , Patient's Chart, lab work & pertinent test results  Airway Mallampati: II  TM Distance: >3 FB     Dental   Pulmonary    breath sounds clear to auscultation       Cardiovascular hypertension, + angina + CAD and + DOE  + dysrhythmias  Rhythm:Regular Rate:Normal  History noted. No CP or SOB. Dr. Nyoka Cowden   Neuro/Psych  Headaches, PSYCHIATRIC DISORDERS Anxiety Depression  Neuromuscular disease    GI/Hepatic hiatal hernia, GERD  ,  Endo/Other  diabetes  Renal/GU Renal disease     Musculoskeletal  (+) Arthritis ,   Abdominal   Peds  Hematology  (+) anemia ,   Anesthesia Other Findings   Reproductive/Obstetrics                            Anesthesia Physical Anesthesia Plan  ASA: III  Anesthesia Plan: General   Post-op Pain Management:    Induction: Intravenous  PONV Risk Score and Plan: 3 and Ondansetron, Dexamethasone and Midazolam  Airway Management Planned: LMA  Additional Equipment:   Intra-op Plan:   Post-operative Plan: Extubation in OR  Informed Consent: I have reviewed the patients History and Physical, chart, labs and discussed the procedure including the risks, benefits and alternatives for the proposed anesthesia with the patient or authorized representative who has indicated his/her understanding and acceptance.     Dental advisory given  Plan Discussed with: CRNA and Anesthesiologist  Anesthesia Plan Comments:         Anesthesia Quick Evaluation

## 2020-10-27 NOTE — Transfer of Care (Signed)
Immediate Anesthesia Transfer of Care Note  Patient: MOZELLA REXRODE  Procedure(s) Performed: POSTERIOR REPAIR (RECTOCELE) (N/A Vagina ) SACROSPINOUS LIGAMENT FIXATION (N/A Vagina ) TRANSVAGINAL TAPE (TVT) PROCEDURE (N/A Vagina ) CYSTOSCOPY (N/A Bladder)  Patient Location: PACU  Anesthesia Type:General  Level of Consciousness: awake, drowsy and responds to stimulation  Airway & Oxygen Therapy: Patient Spontanous Breathing and Patient connected to nasal cannula oxygen  Post-op Assessment: Report given to RN and Post -op Vital signs reviewed and stable  Post vital signs: Reviewed and stable  Last Vitals:  Vitals Value Taken Time  BP    Temp 36.4 C 10/27/20 1406  Pulse 57 10/27/20 1409  Resp 13 10/27/20 1409  SpO2 94 % 10/27/20 1409  Vitals shown include unvalidated device data.  Last Pain:  Vitals:   10/27/20 1059  TempSrc: Oral  PainSc: 0-No pain      Patients Stated Pain Goal: 5 (85/88/50 2774)  Complications: No complications documented.

## 2020-10-27 NOTE — Telephone Encounter (Signed)
Heather [name] underwent posterior repair, sacrospinous ligament fixation, midurethral sling and cystoscopy on 10/27/20.   She failed her voiding trial.  300 ml was backfilled into the bladder Patient unable to void.   She was discharged with a catheter. Please call her for a routine post op check and to schedule a voiding trial by Mon 4/25 or Tues 4/26. Thanks!  Jaquita Folds, MD

## 2020-10-27 NOTE — Anesthesia Procedure Notes (Signed)
Procedure Name: LMA Insertion Date/Time: 10/27/2020 12:53 PM Performed by: Lollie Sails, CRNA Pre-anesthesia Checklist: Patient identified, Emergency Drugs available, Suction available, Patient being monitored and Timeout performed Patient Re-evaluated:Patient Re-evaluated prior to induction Oxygen Delivery Method: Circle system utilized Preoxygenation: Pre-oxygenation with 100% oxygen Induction Type: IV induction Ventilation: Mask ventilation without difficulty LMA: LMA inserted LMA Size: 4.0 Number of attempts: 2 Placement Confirmation: positive ETCO2 and breath sounds checked- equal and bilateral Tube secured with: Tape Dental Injury: Teeth and Oropharynx as per pre-operative assessment

## 2020-10-27 NOTE — Interval H&P Note (Signed)
History and Physical Interval Note:  10/27/2020 12:12 PM  [name]  has presented today for surgery, with the diagnosis of posterior vaginal wall prolapse; vaginal vault prolapse, stress urinary incontinence.  The various methods of treatment have been discussed with the patient and family. After consideration of risks, benefits and other options for treatment, the patient has consented to  Procedure(s) with comments: POSTERIOR REPAIR (RECTOCELE) (N/A)  SACROSPINOUS LIGAMENT FIXATION (N/A) TRANSVAGINAL TAPE (TVT) PROCEDURE (N/A) CYSTOSCOPY (N/A) as a surgical intervention.  The patient's history has been reviewed, patient examined, no change in status, stable for surgery.  I have reviewed the patient's chart and labs.  Questions were answered to the patient's satisfaction.     Heather [name]

## 2020-10-27 NOTE — Discharge Instructions (Signed)
POST OPERATIVE INSTRUCTIONS  General Instructions . Recovery (not bed rest) will last approximately 6 weeks . Walking is encouraged, but refrain from strenuous exercise/ housework/ heavy lifting. o No lifting >10lbs  . Nothing in the vagina- NO intercourse, tampons or douching . Bathing:  Do not submerge in water (NO swimming, bath, hot tub, etc) until after your postop visit. You can shower starting the day after surgery.  . No driving until you are not taking narcotic pain medicine and until your pain is well enough controlled that you can slam on the breaks or make sudden movements if needed.   Taking your medications 1. Please take your acetaminophen and ibuprofen on a schedule for the first 48 hours. Take 600mg ibuprofen, then take 500mg acetaminophen 3 hours later, then continue to alternate ibuprofen and acetaminophen. That way you are taking each type of medication every 6 hours. 2. Take the prescribed narcotic (oxycodone, tramadol, etc) as needed, with a maximum being every 4 hours.  3. Take a stool softener daily to keep your stools soft and preventing you from straining. If you have diarrhea, you decrease your stool softener. This is explained more below. We have prescribed you Miralax.  Reasons to Call the Nurse (see last page for phone numbers) . Heavy Bleeding (changing your pad every 1-2 hours) . Persistent nausea/vomiting . Fever (100.4 degrees or more) . Incision problems (pus or other fluid coming out, redness, warmth, increased pain)  Things to Expect After Surgery . Mild to Moderate pain is normal during the first day or two after surgery. If prescribed, take Ibuprofen or Tylenol first and use the stronger medicine for "break-through" pain. You can overlap these medicines because they work differently.   . Constipation   . To Prevent Constipation:  Eat a well-balanced diet including protein, grains, fresh fruit and vegetables.  Drink plenty of fluids. Walk regularly.   Depending on specific instructions from your physician: take Miralax daily and additionally you can add a stool softener (colace/ docusate) and fiber supplement. Continue as long as you're on pain medications.   . To Treat Constipation:  If you do not have a bowel movement in 2 days after surgery, you can take 2 Tbs of Milk of Magnesia 1-2 times a day until you have a bowel movement. If diarrhea occurs, decrease the amount or stop the laxative. If no results with Milk of Magnesia, you can drink a bottle of magnesium citrate which you can purchase over the counter.  . Fatigue:  This is a normal response to surgery and will improve with time.  Plan frequent rest periods throughout the day.  . Gas Pain:  This is very common but can also be very painful! Drink warm liquids such as herbal teas, bouillon or soup. Walking will help you pass more gas.  Mylicon or Gas-X can be taken over the counter.  . Leaking Urine:  Varying amounts of leakage may occur after surgery.  This should improve with time. Your bladder needs at least 3 months to recover from surgery. If you leak after surgery, be sure to mention this to your doctor at your post-op visit. If you were taking medications for overactive bladder prior to surgery, be sure to restart the medications immediately after surgery.  . Incisions: If you have incisions on your abdomen, the skin glue will dissolve on its own over time. It is ok to gently rinse with soap and water over these incisions but do not scrub.  Catheter Approximately 50%   of patients are unable to urinate after surgery and need to go home with a catheter. This allows your bladder to rest so it can return to full function. If you go home with a catheter, the office will call to set up a voiding trial a few days after surgery. For most patients, by this visit, they are able to urinate on their own. Long term catheter use is rare.   Return to Work  As work demands and recovery times vary  widely, it is hard to predict when you will want to return to work. If you have a desk job with no strenuous physical activity, and if you would like to return sooner than generally recommended, discuss this with your provider or call our office.   Post op concerns  For non-emergent issues, please call the Urogynecology Nurse. Please leave a message and someone will contact you within one business day.  You can also send a message through [place].   AFTER HOURS (After 5:00 PM and on weekends):  For urgent matters that cannot wait until the next business day. Call our office [phone] and connect to the doctor on call.  Please reserve this for important issues.   **FOR ANY TRUE EMERGENCY ISSUES CALL 911 OR GO TO THE NEAREST EMERGENCY ROOM.** Please inform our office or the doctor on call of any emergency.     APPOINTMENTS: Call [phone]  Post Anesthesia Home Care Instructions  Activity: Get plenty of rest for the remainder of the day. A responsible individual must stay with you for 24 hours following the procedure.  For the next 24 hours, DO NOT: -Drive a car -Paediatric nurse -Drink alcoholic beverages -Take any medication unless instructed by your physician -Make any legal decisions or sign important papers.  Meals: Start with liquid foods such as gelatin or soup. Progress to regular foods as tolerated. Avoid greasy, spicy, heavy foods. If nausea and/or vomiting occur, drink only clear liquids until the nausea and/or vomiting subsides. Call your physician if vomiting continues.  Special Instructions/Symptoms: Your throat may feel dry or sore from the anesthesia or the breathing tube placed in your throat during surgery. If this causes discomfort, gargle with warm salt water. The discomfort should disappear within 24 hours.

## 2020-10-27 NOTE — Op Note (Signed)
Operative Note  Preoperative Diagnosis: posterior vaginal prolapse, vaginal vault prolapse after hysterectomy and stress urinary incontinence  Postoperative Diagnosis: same  Procedures performed:  Posterior repair, sacrospinous ligament fixation, midurethral sling (Advantage Fit), cystoscopy  Implants:  Implant Name Type Inv. Item Serial No. Manufacturer Lot No. LRB No. Used Action  SYS SLING ADV FIT BLUE TRNSVAG - [id number] Sling SYS SLING ADV FIT BLUE TRNSVAG  [employer] [id number] N/A 1 Implanted    Attending Surgeon: Sherlene Shams, MD  Anesthesia: General LMA  Findings: 1. On vaginal exam, stage II posterior and apical prolapse noted  2. On cystoscopy, normal bladder and urethra without injury lesion or foreign body. Brisk bilateral ureteral efflux noted.   Estimated blood loss: 50 mL  IV fluids: 820 mL  Urine output: 810 mL  Complications: none  Procedure in Detail:  After informed consent was obtained, the patient was taken to the operating room where anesthesia was induced and found to be adequate. She was placed in dorsal lithotomy position, taking care to avoid any traction on the extremities, and then prepped and draped in the usual sterile fashion. A self-retaining lonestar retractor was placed using four elastic blue stays.  After a foley catheter was inserted into the urethra.  Vaginal exam concluded that an apical procedure was also needed. Two Allis clamps were along the posterior vaginal wall defect. 1% lidocaine with epinephrine was injected into the vaginal mucosa.  A vertical incision was made between these two Allis clamps with a 15 blade scalpel.  Allis clamps were placed along this incision and Metzenbaum scissors were used to undermine the vaginal mucosa along the incision. An enterocele defect was noted with bowel protruding through. This was closed with a 2-0 vicryl pursestring suture.   The remainder of the vaginal mucosa was then sharply dissected off  to the septum bilaterally.    For the sacrospinous ligament fixation (SSLF), the ischial spine was accessed on the right side via dissection with scissors and blunt dissection.  The sacrospinous ligament was palpated. Two 0 PDS suture was then placed at the sacrospinous ligament two fingerbreadths medial to the ischial spine, in order to avoid the pudendal neurovascular bundle, using a Capio needle driver.  The PDS suture was attached to the vaginal epithelium on the ipsilateral side of the vaginal apex and held. Posterior plication of the rectovaginal septum was then performed using plicating sutures of 2-0 Vicryl. The last distal stitch incorporated the perineal body in a U stitch fashion. The vaginal mucosal edges were trimmed and the incision reapproximated with 2-0 Vicryl in a running fashion. The SSLF suture was then tied down with excellent support of the posterior and apical vagina.   The mid urethral area was located on the anterior vaginal wall.  Two Allis clamps were placed at the level of the midurethra. 1% lidocaine with epinephrine was injected into the vaginal mucosa. A vertical incision was made between the two clamps using a 15-blade scalpel.  Using sharp dissection, Metzenbaum scissors were used to make a periurethral tunnel from the vaginal incision towards the pubic rami bilaterally for the future sling tracts. The bladder was ensured to be empty. The trocar and attached sling were introduced into the right side of the periurethral vaginal incision, just inferior to the pubic symphysis on the right side. The trocar was guided through the endopelvic fascia and directly vertically.  While hugging the cephalad surface of the pubic bone, the trocar was guided out through the abdomen 2 fingerbreadths lateral  to midline at the level of the pubic symphysis on the ipsilateral side. The trocar was placed on the left side in a similar fashion.  A 70-degree cystoscope was introduced, and 360-degree  inspection revealed no trauma or trocars in the bladder, with brisk bilateral ureteral efflux.  The bladder was drained and the cystoscope was removed.  The Foley catheter was reinserted.  The sling was brought to lie beneath the mid-urethra.  A needle driver was placed behind the sling to ensure no tension. The plastic sheath was removed from the sling and the distal ends of the sling were trimmed just below the level of the skin incisions. Hemostasis was noted.  Tension-free positioning of the sling was confirmed. Vaginal inspection revealed no vaginotomy or sling perforations of the mucosa. The vaginal mucosal edges were reapproximated using 2-0 Vicryl.  The vagina was copiously irrigated.  Hemostasis was again noted. Vaginal packing was not placed. The suprapubic sling incisions were closed with Dermabond. The patient tolerated the procedure well.  She was awakened from anesthesia and transferred to the recovery room in stable condition. All needle and sponge counts were correct x 2.  Heather Folds, MD

## 2020-10-28 ENCOUNTER — Ambulatory Visit (INDEPENDENT_AMBULATORY_CARE_PROVIDER_SITE_OTHER): Payer: Medicare HMO | Admitting: Obstetrics and Gynecology

## 2020-10-28 ENCOUNTER — Encounter (HOSPITAL_BASED_OUTPATIENT_CLINIC_OR_DEPARTMENT_OTHER): Payer: Self-pay | Admitting: Obstetrics and Gynecology

## 2020-10-28 DIAGNOSIS — R338 Other retention of urine: Secondary | ICD-10-CM

## 2020-10-28 NOTE — Progress Notes (Signed)
Urogyn Nurse Voiding Trial Note  Heather [name] underwent posterior repair, sacrospinous ligament fixation, midurethral sling and cystoscopy on 10/27/20.  She presents today for a voiding trial .   Patient was identified with 2 indicators. 159ml of NS was instilled into the bladder via the catheter. The catheter was removed and patient was instructed to void into the urinary hat. She voided 117ml. The post void residual measured by bladder scan was 29ml. She passed the voiding trial and a catheter was not replaced.   The patient received aftercare instructions and will follow up as scheduled.

## 2020-10-28 NOTE — Telephone Encounter (Addendum)
Post- Op Call  Heather [name] underwent  posterior repair, sacrospinous ligament fixation, midurethral sling and cystoscopy on 10/27/20 with Dr Wannetta Sender. The patient reports that her pain is controlled. Pt said she has pain when she wants to urinate even though she has a catheter. She is taking Tylenol and tramadol. She minimal vaginal bleeding. She has not had a bowel movement and is not taking anying for a bowel movement. Pt said she does not need to go. She was discharged with a catheter and will return on 10/31/20 @11am  for a voiding trial.   Elita Quick, [place]

## 2020-10-30 ENCOUNTER — Telehealth: Payer: Self-pay | Admitting: Obstetrics and Gynecology

## 2020-10-30 ENCOUNTER — Other Ambulatory Visit: Payer: Self-pay | Admitting: Family Medicine

## 2020-10-30 ENCOUNTER — Encounter: Payer: Self-pay | Admitting: Obstetrics and Gynecology

## 2020-10-30 DIAGNOSIS — R52 Pain, unspecified: Secondary | ICD-10-CM

## 2020-10-30 DIAGNOSIS — R110 Nausea: Secondary | ICD-10-CM

## 2020-10-30 MED ORDER — PROMETHAZINE HCL 12.5 MG PO TABS
12.5000 mg | ORAL_TABLET | Freq: Three times a day (TID) | ORAL | 0 refills | Status: DC | PRN
Start: 2020-10-30 — End: 2021-01-23

## 2020-10-30 MED ORDER — GABAPENTIN 100 MG PO CAPS: 100.0000 mg | ORAL_CAPSULE | Freq: Three times a day (TID) | ORAL | 3 refills | Status: DC

## 2020-10-30 MED ORDER — KETOROLAC TROMETHAMINE 10 MG PO TABS: 10.0000 mg | ORAL_TABLET | Freq: Four times a day (QID) | ORAL | 0 refills | Status: DC | PRN

## 2020-10-30 NOTE — Telephone Encounter (Signed)
Patient's daughter called and states that she has not had a bowel movement since surgery on 4/21. She is POD#3 s/p posterior repair, sacrospinous fixation, TVT sling and cystoscopy. Catheter removed in office on 4/22.  She is having urinary leakage but seems to be emptying her bladder. Her abdomen feels swollen especially on the left side. They have tried miralax and stool softeners to have a bowel movement without success. Tried magnesium citrate but has been nauseated as well and vomited most of it up. Until today, has been eating well, although smaller amounts than normal. She has an enema at home. Advised to start with the enema. If she has a large volume of stool and symptoms improved then she should stay hydrated and drink clear liquids (soup, gatorade, jello, etc) for the rest of the day. If this does not completely relieve her symptoms, she should get magneium citrate and slowly drink the bottle with the other liquids. Has some zofran at home which she took last night and did not help much. Will send Rx for phenergan 12.5mg  q6hrs.   Also complaining of pain in the buttock area, mostly on the right side. I explained that this pain is common for this type of surgery and is usually related to nerve irritation. Will order gabapentin 100mg  TID. Also feels that she had the most relief from the toradol in the hospital. She has been sensitive to NSAIDs in the past and so ibuprofen was not prescribed initially. Will send small prescription for toradol 10mg  tabs q6hrs prn.   Patient and daughter will keep informed of her progress. Can evaluate tomorrow in the office if needed. Advised to notify if symptoms are worsening and then will need more acute evaluation in the ED.   Jaquita Folds, MD

## 2020-10-31 ENCOUNTER — Ambulatory Visit (INDEPENDENT_AMBULATORY_CARE_PROVIDER_SITE_OTHER): Payer: Medicare HMO | Admitting: Obstetrics and Gynecology

## 2020-10-31 ENCOUNTER — Telehealth: Payer: Self-pay | Admitting: *Deleted

## 2020-10-31 ENCOUNTER — Other Ambulatory Visit: Payer: Self-pay

## 2020-10-31 ENCOUNTER — Ambulatory Visit: Payer: Medicare HMO

## 2020-10-31 ENCOUNTER — Encounter: Payer: Self-pay | Admitting: Obstetrics and Gynecology

## 2020-10-31 VITALS — BP 132/73 | HR 56

## 2020-10-31 DIAGNOSIS — R338 Other retention of urine: Secondary | ICD-10-CM

## 2020-10-31 DIAGNOSIS — R300 Dysuria: Secondary | ICD-10-CM | POA: Diagnosis not present

## 2020-10-31 MED ORDER — SULFAMETHOXAZOLE-TRIMETHOPRIM 800-160 MG PO TABS: 1.0000 | ORAL_TABLET | Freq: Two times a day (BID) | ORAL | 0 refills | Status: AC

## 2020-10-31 NOTE — Telephone Encounter (Signed)
Called pt to evaluate how she is doing after her surgery. She states that she is still in a lot of pain. She rates it as 8-9/10. She describes it as constant, says that it hurts to sit, stand, move. She states that she has been taking the ketorolac that was prescribed and has not noticed any improvement with it.  She states that she has taken laxatives and is not having to strain with bowel movements. She c/o frequent urination and leaking.  She denies vaginal bleeding. Advised that I would speak with the provider regarding her pain and let her know of any other recommendations. Pt verbalized understanding.

## 2020-10-31 NOTE — Progress Notes (Signed)
Heather [name] presents is POD #4 s/p posterior repair, sacrospinous fixation, TVT sling and cystoscopy. She is continuing to have a swollen abdomen and severe lower abdominal pain. She did the enema yesterday and was able to have several bowel movements which relieved her symptoms temporarily. Has some left sided back pain as well.  Continues to leak urine and is urinating little on her own. Bladder scan performed and volume noted to be >758ml.  Today's Vitals   10/31/20 1222  BP: 132/73  Pulse: (!) 56   External genitalia appears normal. No active vaginal bleeding noted.  Catheter placed and 1200 ml dark yellow urine drained. Patient noted immediate relief.  Abdomen is soft, nontender, non-distended.   A/P:  - Acute post-operative urinary retention. Catheter replaced today. Will leave in place for one week and have her return for voiding trial.  - Will start antibiotics - bactrim DS BID until catheter removed.   Jaquita Folds, MD

## 2020-11-03 ENCOUNTER — Encounter: Payer: Self-pay | Admitting: Obstetrics and Gynecology

## 2020-11-03 ENCOUNTER — Emergency Department (HOSPITAL_BASED_OUTPATIENT_CLINIC_OR_DEPARTMENT_OTHER)
Admission: EM | Admit: 2020-11-03 | Discharge: 2020-11-03 | Disposition: A | Discharge status: Home / Self Care | Payer: Medicare HMO | Attending: Emergency Medicine | Admitting: Emergency Medicine

## 2020-11-03 ENCOUNTER — Emergency Department (HOSPITAL_BASED_OUTPATIENT_CLINIC_OR_DEPARTMENT_OTHER): Payer: Medicare HMO

## 2020-11-03 ENCOUNTER — Encounter: Payer: Self-pay | Admitting: Family Medicine

## 2020-11-03 ENCOUNTER — Encounter (HOSPITAL_BASED_OUTPATIENT_CLINIC_OR_DEPARTMENT_OTHER): Payer: Self-pay | Admitting: *Deleted

## 2020-11-03 ENCOUNTER — Other Ambulatory Visit: Payer: Self-pay

## 2020-11-03 ENCOUNTER — Telehealth: Payer: Self-pay | Admitting: Family Medicine

## 2020-11-03 DIAGNOSIS — Z8541 Personal history of malignant neoplasm of cervix uteri: Secondary | ICD-10-CM | POA: Diagnosis not present

## 2020-11-03 DIAGNOSIS — M7989 Other specified soft tissue disorders: Secondary | ICD-10-CM | POA: Diagnosis present

## 2020-11-03 DIAGNOSIS — I2510 Atherosclerotic heart disease of native coronary artery without angina pectoris: Secondary | ICD-10-CM | POA: Diagnosis not present

## 2020-11-03 DIAGNOSIS — I10 Essential (primary) hypertension: Secondary | ICD-10-CM | POA: Insufficient documentation

## 2020-11-03 DIAGNOSIS — Z7982 Long term (current) use of aspirin: Secondary | ICD-10-CM | POA: Diagnosis not present

## 2020-11-03 DIAGNOSIS — E119 Type 2 diabetes mellitus without complications: Secondary | ICD-10-CM | POA: Diagnosis not present

## 2020-11-03 DIAGNOSIS — R600 Localized edema: Secondary | ICD-10-CM | POA: Insufficient documentation

## 2020-11-03 DIAGNOSIS — Z79899 Other long term (current) drug therapy: Secondary | ICD-10-CM | POA: Insufficient documentation

## 2020-11-03 DIAGNOSIS — Z96653 Presence of artificial knee joint, bilateral: Secondary | ICD-10-CM | POA: Diagnosis not present

## 2020-11-03 DIAGNOSIS — R609 Edema, unspecified: Secondary | ICD-10-CM

## 2020-11-03 NOTE — ED Notes (Signed)
US at bedside

## 2020-11-03 NOTE — ED Provider Notes (Signed)
[facility] EMERGENCY DEPT Provider Note   CSN: [id number] Arrival date & time: 11/03/20  1707     History No chief complaint on file.   Heather [name] is a 86 y.o. female.  Pt presents to the ED today with RLE pain and swelling.  Pt has a hx of a DVT in 2016 after having knee surgery.  Pt was on Xarelto back then, but is no longer on it as it was a provoked DVT.  Pt had surgery on 4/21 for a vaginal prolapse repair.  The pt was not hospitalized over night, but did not move around a lot initially due to pain.  Pt said her right leg has been swollen for 3 days.  It feels similar to prior DVT.  She denies any trauma to her leg.        Past Medical History:  Diagnosis Date  . Chronic stable angina ([place])   . Coronary artery disease cardiologist--- dr Burt Knack   hx NSTEMI  w/ cardiac cath 03-16-2005 PCI with DES to LCx;   cardiac cath 06-27-2005  PCI with DES to RCA with residual dx LAD manage medcially;  lexiscan 01-26-20211 normal no ischemia, ef 70%;  stress echo w/ dobutamine 10-24-2011 negative ishcmeia , normal ef // Myoview 2/22: EF 67, no ischemia or infarction, no TID, low risk   . Diabetes mellitus type 2, diet-controlled ([place])    followed by pcp  (10-19-2020  pt stated checks daily in am,  fasting blood surgar--- 115--120s)  . DOE (dyspnea on exertion)    per pt "when I over do",  ok with household chores  . Echocardiogram 08/2020    Echocardiogram 2/22: EF 55-60, no RWMA, mild LVH, Gr 2 DD, GLS-21.7%, normal RVSF, trivial MR, RVSP 39.5  . GERD (gastroesophageal reflux disease)   . Hiatal hernia    recurrence,  hx HH repair 1989  . History of cervical cancer    s/p  vaginal hysterectomy  . History of DVT of lower extremity 2016   11-29-2014 post op right TKA of right lower extremity and completed xarelto   . History of esophageal stricture    hx s/p dilatation's  . History of gastric ulcer 2005 approx.  Marland Kitchen History of palpitations 2010   event monitor  07-07-2009 showed NSR w/ freq. SVT ectopies with short runs, rare PVCs  . History of TIA (transient ischemic attack) 06/1999   12-15-2019  per pt had several TIA between 12/ 2000 to 02/ 2001 , was sent to specialist _0 , had test that was normal (10-19-2020 pt stated no TIAs since ) but has residual of essential tremors of right arm/ hand  . Hypertension   . Intermittent palpitations   . IT band syndrome   . Migraine    "ice pick headche lasts about 30 seconds"  . Mixed hyperlipidemia   . Mixed incontinence urge and stress   . Multiple thyroid nodules    followed by pcp---   ultrasound 11-22-2014 no bx   (12-15-2019 per pt had a endocrinologist and was told did not need bx)  . OA (osteoarthritis)    knees, elbow, hip, ankles  . Occasional tremors    right arm/ hand  s/p TIA residual 2000  . Osteoporosis    taking vitamin d  . Right bundle branch block (RBBB) with left anterior fascicular block (LAFB)   . RLS (restless legs syndrome)   . RLS (restless legs syndrome)   . S/P drug eluting coronary stent placement  2006   03-16-2005  PCI x1 DES to LCx;   06-27-2005  PCI x1 DES to RCA    Patient Active Problem List   Diagnosis Date Noted  . Low back pain radiating to right leg 07/14/2020  . 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 Prolapse of female pelvic organs 12/22/2019  . Pelvic relaxation 12/22/2019  . Right hip pain 10/13/2019  . Left foot pain 03/08/2019  . Pelvic prolapse 03/08/2019  . Pain of breast 01/29/2019  . Overactive bladder 01/29/2019  . Educated about COVID-19 virus infection 12/02/2018  . Hyperlipidemia associated with type 2 diabetes mellitus ([place]) 05/27/2017  . Right shoulder pain 04/29/2015  . Diarrhea 04/17/2015  . Headache 04/17/2015  . Multinodular goiter 01/17/2015  . Peroneal DVT (deep venous thrombosis) ([place]) 12/22/2014  . S/P total knee arthroplasty 11/29/2014  . Arthritis of knee, degenerative 11/19/2014  . Right knee pain 11/19/2014  .  Neck pain 10/29/2014  . Insomnia 02/05/2014  . Preventative health care 11/22/2013  . RLS (restless legs syndrome) 10/04/2013  . Lower urinary tract infectious disease 10/04/2013  . Cataracts, bilateral 04/02/2013  . Leg swelling 01/17/2012  . Hypokalemia 01/05/2012  . Anemia 01/05/2012  . Depression with anxiety 01/05/2012  . Postoperative anemia due to acute blood loss 11/15/2011  . 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 Pre-syncope 10/13/2011  . DM (diabetes mellitus) ([place]) 10/06/2011  . Pulmonary nodule 10/06/2011  . Epigastric pain 10/06/2011  . DJD (degenerative joint disease) of knee 09/18/2011  . It band syndrome, right 08/28/2011  . Renal insufficiency 08/28/2011  . CORONARY ATHEROSCLEROSIS NATIVE CORONARY ARTERY 03/07/2010  . Coronary atherosclerosis 01/18/2009  . FATIGUE 01/18/2009  . PERSISTENT DISORDER INITIATING/MAINTAINING SLEEP 09/09/2008  . DERMATITIS 10/16/2007  . PERIPHERAL EDEMA 10/16/2007  . PALPITATIONS 10/16/2007  . Essential hypertension 04/14/2007  . GERD 04/14/2007  . TRANSIENT ISCHEMIC ATTACK, HX OF 04/14/2007    Past Surgical History:  Procedure Laterality Date  . ANTERIOR AND POSTERIOR REPAIR N/A 12/22/2019   Procedure: ANTERIOR (CYSTOCELE)  REPAIR;  Surgeon: Janyth Contes, MD;  Location: [facility];  Service: Gynecology;  Laterality: N/A;  . ANTERIOR AND POSTERIOR REPAIR WITH SACROSPINOUS FIXATION N/A 10/27/2020   Procedure: SACROSPINOUS LIGAMENT FIXATION;  Surgeon: Jaquita Folds, MD;  Location[facility];  Service: Gynecology;  Laterality: N/A;  . BLADDER SUSPENSION N/A 10/27/2020   Procedure: TRANSVAGINAL TAPE (TVT) PROCEDURE;  Surgeon: Jaquita Folds, MD;  Location: [facility];  Service: Gynecology;  Laterality: N/A;  . CATARACT EXTRACTION W/ INTRAOCULAR LENS  IMPLANT, BILATERAL  2015  . COLONOSCOPY  last one ?  . CORONARY ANGIOPLASTY WITH STENT PLACEMENT  03-16-2005    dr Lia Foyer   PCI and DES x1 to LCx  . CORONARY ANGIOPLASTY WITH STENT PLACEMENT  06-27-2005  dr Lia Foyer   PCI and DES x1 to RCA with residual disease LAD 70-80% to manage medically  . CYSTOSCOPY N/A 10/27/2020   Procedure: CYSTOSCOPY;  Surgeon: Jaquita Folds, MD;  Location: [facility];  Service: Gynecology;  Laterality: N/A;  . FOOT SURGERY Left 1990s   left foot stress fracture repair, per pt no hardware  . HIATAL HERNIA REPAIR  1989  . KNEE ARTHROSCOPY Bilateral right ?/   left x2 , last one 09-12-2009 @ [place]  . RECTOCELE REPAIR N/A 04/20/2020   Procedure: POSTERIOR REPAIR (RECTOCELE);  Surgeon: Janyth Contes, MD;  Location: [facility];  Service: Gynecology;  Laterality: N/A;  .  RECTOCELE REPAIR N/A 10/27/2020   Procedure: POSTERIOR REPAIR (RECTOCELE);  Surgeon: Jaquita Folds, MD;  Location: [facility];  Service: Gynecology;  Laterality: N/A;  total time requested for all procedures is 2 hours  . TOTAL KNEE ARTHROPLASTY  11/12/2011   Procedure: TOTAL KNEE ARTHROPLASTY;  Surgeon: Lorn Junes, MD;  Location: [facility];  Service: Orthopedics;  Laterality: Left;  Dr Noemi Chapel wants 90 minutes for this case  . TOTAL KNEE ARTHROPLASTY Right 11/29/2014   Procedure: RIGHT TOTAL KNEE ARTHROPLASTY;  Surgeon: Vickey Huger, MD;  Location: [facility];  Service: Orthopedics;  Laterality: Right;  . UPPER GASTROINTESTINAL ENDOSCOPY  last one 04-25-2017   with dilatation esophageal stricture and savary dilatation  . VAGINAL HYSTERECTOMY  1988    no ovaries removed for bleeeding     OB History    Gravida  4   Para  1   Term      Preterm  1   AB      Living  4     SAB      IAB      Ectopic      Multiple      Live Births              Family History  Problem Relation Age of Onset  . Stroke Father        family hx of M 1st degree relative <50  . Coronary artery disease Mother   . Heart disease Mother   . Depression  Brother   . Stroke Brother   . Diabetes Brother   . Cancer Brother        bladder with mets  . Diabetes Daughter        borderline  . Hypertension Daughter   . Arthritis Other        family hx of  . Hypertension Other        family hx of  . Other Other        family hx of cardiovascular disorder  . Thyroid disease Daughter   . Breast cancer Neg Hx   . Colon cancer Neg Hx   . Anesthesia problems Neg Hx   . Hypotension Neg Hx   . Malignant hyperthermia Neg Hx   . Pseudochol deficiency Neg Hx   . Colon polyps Neg Hx   . Esophageal cancer Neg Hx   . Rectal cancer Neg Hx   . Stomach cancer Neg Hx     Social History   Tobacco Use  . Smoking status: Never Smoker  . Smokeless tobacco: Never Used  Vaping Use  . Vaping Use: Never used  Substance Use Topics  . Alcohol use: No    Alcohol/week: 0.0 standard drinks  . Drug use: Never    Home Medications Prior to Admission medications   Medication Sig Start Date End Date Taking? Authorizing Provider  acetaminophen (TYLENOL) 500 MG tablet Take 1,000 mg by mouth every 6 (six) hours as needed.    [provider]  amLODipine (NORVASC) 10 MG tablet Take 1 tablet (10 mg total) by mouth daily. Patient taking differently: Take 10 mg by mouth daily. 09/06/20   Mosie Lukes, MD  Ascorbic Acid (VITAMIN C) 500 MG CHEW Chew by mouth daily.    [provider]  aspirin EC 81 MG tablet Take 1 tablet (81 mg total) by mouth daily. 05/04/15   [name], MD  Blood Glucose Monitoring Suppl (TRUE METRIX AIR GLUCOSE METER) w/Device  KIT USE TO CHECK BLOOD SUGAR ONCE DAILY AND AS NEEDED.  DX CODE E11.9 07/27/20   Mosie Lukes, MD  Cholecalciferol (VITAMIN D) 50 MCG (2000 UT) CAPS Take 2,000 Units by mouth daily.     [provider]  famotidine (PEPCID) 20 MG tablet Take 20 mg by mouth at bedtime as needed for heartburn or indigestion.    [provider]  gabapentin (NEURONTIN) 100 MG capsule Take 1 capsule (100  mg total) by mouth 3 (three) times daily. 10/30/20   Jaquita Folds, MD  glucose blood (TRUE METRIX BLOOD GLUCOSE TEST) test strip USE TO CHECK BLOOD SUGAR ONCE A DAY OR AS NEEDED.  DX CODE: E11.9 07/27/20   Mosie Lukes, MD  hyoscyamine (LEVSIN SL) 0.125 MG SL tablet Place 1 tablet (0.125 mg total) under the tongue every 4 (four) hours as needed. 03/06/19   Mosie Lukes, MD  isosorbide mononitrate (IMDUR) 60 MG 24 hr tablet Take 1 tablet (60 mg total) by mouth daily. 08/19/20   [name] T, PA-C  ketorolac (TORADOL) 10 MG tablet Take 1 tablet (10 mg total) by mouth every 6 (six) hours as needed. 10/30/20   Jaquita Folds, MD  losartan (COZAAR) 100 MG tablet Take 1 tablet (100 mg total) by mouth daily. 10/31/20   Mosie Lukes, MD  magnesium oxide (MAG-OX) 400 MG tablet Take 400 mg by mouth 2 (two) times daily.    [provider]  metoprolol succinate (TOPROL-XL) 50 MG 24 hr tablet Take 1.5 tablets (75 mg total) by mouth at bedtime. Take with or immediately following a meal. Patient taking differently: Take 75 mg by mouth at bedtime. Take with or immediately following a meal. 08/29/20 08/29/21  [name] T, PA-C  Multiple Vitamins-Minerals (ZINC PO) Take by mouth daily.    [provider]  nitroGLYCERIN (NITROSTAT) 0.4 MG SL tablet Place 1 tablet (0.4 mg total) under the tongue every 5 (five) minutes as needed for chest pain. 08/29/20 08/29/21  [name] T, PA-C  omeprazole (PRILOSEC) 20 MG capsule Take 1 capsule (20 mg total) by mouth daily as needed. Patient taking differently: Take 20 mg by mouth daily. 03/01/20   Mosie Lukes, MD  ondansetron (ZOFRAN ODT) 4 MG disintegrating tablet Take 1 tablet (4 mg total) by mouth every 6 (six) hours as needed for nausea or vomiting. Patient not taking: Reported on 10/19/2020 12/15/19   Yetta Flock, MD  polyethylene glycol powder (GAVILAX) 17 GM/SCOOP powder Take 17 g by mouth daily. Patient taking differently:  Take 17 g by mouth as directed. As directed by dr schroeder, pre-op for surgery on 10-27-2020 10/19/20   Jaquita Folds, MD  potassium chloride SA (KLOR-CON) 20 MEQ tablet Take 0.5 tablets (10 mEq total) by mouth daily. 09/14/20   [name] T, PA-C  pramipexole (MIRAPEX) 0.25 MG tablet Take 1 tablet (0.25 mg total) by mouth 2 (two) times daily. Patient taking differently: Take 0.25 mg by mouth 2 (two) times daily. 09/26/20   Mosie Lukes, MD  promethazine (PHENERGAN) 12.5 MG tablet Take 1 tablet (12.5 mg total) by mouth every 8 (eight) hours as needed for nausea or vomiting. 10/30/20   Jaquita Folds, MD  simvastatin (ZOCOR) 20 MG tablet TAKE 1 TABLET AT BEDTIME Patient taking differently: Take by mouth at bedtime. 09/01/20   [name] T, PA-C  spironolactone (ALDACTONE) 25 MG tablet Take 0.5 tablets (12.5 mg total) by mouth 3 (three) times a  week. Patient taking differently: Take 12.5 mg by mouth 3 (three) times a week. Monday /  Wednesday/ Friday 09/30/20   [name] T, PA-C  sulfamethoxazole-trimethoprim (BACTRIM DS) 800-160 MG tablet Take 1 tablet by mouth 2 (two) times daily for 7 days. 10/31/20 11/07/20  Jaquita Folds, MD  TRUEplus Lancets 33G MISC USE TO CHECK BLOOD SUGAR ONCE DAILY AND AS NEEDED.  DX CODE: E11.9 07/27/20   Mosie Lukes, MD    Allergies    Baclofen and Nitrofurantoin  Review of Systems   Review of Systems  Musculoskeletal:       Right leg pain and swelling  All other systems reviewed and are negative.   Physical Exam Updated Vital Signs BP 126/69 (BP Location: Right Arm)   Pulse (!) 58   Temp 98.4 F (36.9 C) (Oral)   Resp 20   Ht 5' 3" (1.6 m)   Wt 72.3 kg   LMP  (LMP Unknown)   SpO2 96%   BMI 28.22 kg/m   Physical Exam Vitals and nursing note reviewed.  Constitutional:      Appearance: Normal appearance.  HENT:     Head: Normocephalic and atraumatic.     Right Ear: External ear normal.     Left Ear: External ear  normal.     Nose: Nose normal.     Mouth/Throat:     Mouth: Mucous membranes are moist.     Pharynx: Oropharynx is clear.  Eyes:     Extraocular Movements: Extraocular movements intact.     Conjunctiva/sclera: Conjunctivae normal.     Pupils: Pupils are equal, round, and reactive to light.  Cardiovascular:     Rate and Rhythm: Normal rate.     Pulses: Normal pulses.  Pulmonary:     Effort: Pulmonary effort is normal.  Abdominal:     General: Abdomen is flat. Bowel sounds are normal.     Palpations: Abdomen is soft.  Genitourinary:    Comments: Indwelling foley Musculoskeletal:     Cervical back: Normal range of motion.     Right lower leg: Edema present.  Skin:    General: Skin is warm.     Capillary Refill: Capillary refill takes less than 2 seconds.  Neurological:     General: No focal deficit present.     Mental Status: She is alert and oriented to person, place, and time.  Psychiatric:        Mood and Affect: Mood normal.        Behavior: Behavior normal.     ED Results / Procedures / Treatments   Labs (all labs ordered are listed, but only abnormal results are displayed) Labs Reviewed - No data to display  EKG None  Radiology US Venous Img Lower Unilateral Right  Result Date: 11/03/2020 CLINICAL DATA:  86 year old female with right-sided leg pain. EXAM: Right LOWER EXTREMITY VENOUS DOPPLER ULTRASOUND TECHNIQUE: Gray-scale sonography with graded compression, as well as color Doppler and duplex ultrasound were performed to evaluate the lower extremity deep venous systems from the level of the common femoral vein and including the common femoral, femoral, profunda femoral, popliteal and calf veins including the posterior tibial, peroneal and gastrocnemius veins when visible. The superficial great saphenous vein was also interrogated. Spectral Doppler was utilized to evaluate flow at rest and with distal augmentation maneuvers in the common femoral, femoral and popliteal  veins. COMPARISON:  None. FINDINGS: Contralateral Common Femoral Vein: Respiratory phasicity is normal and symmetric with the symptomatic side.  No evidence of thrombus. Normal compressibility. Common Femoral Vein: No evidence of thrombus. Normal compressibility, respiratory phasicity and response to augmentation. Saphenofemoral Junction: No evidence of thrombus. Normal compressibility and flow on color Doppler imaging. Profunda Femoral Vein: No evidence of thrombus. Normal compressibility and flow on color Doppler imaging. Femoral Vein: No evidence of thrombus. Normal compressibility, respiratory phasicity and response to augmentation. Popliteal Vein: No evidence of thrombus. Normal compressibility, respiratory phasicity and response to augmentation. Calf Veins: The visualized tibial vein appear patent. There is partial compression of the peroneal vein with peripheral linear echogenic content, likely scarring or old thrombus. Superficial Great Saphenous Vein: No evidence of thrombus. Normal compressibility. Venous Reflux:  None. Other Findings: There is diffuse subcutaneous edema of the right calf. IMPRESSION: 1. No evidence of acute deep venous thrombosis. 2. Right calf subcutaneous edema. Electronically Signed   By: Anner Crete M.D.   On: 11/03/2020 19:26    Procedures Procedures   Medications Ordered in ED Medications - No data to display  ED Course  I have reviewed the triage vital signs and the nursing notes.  Pertinent labs & imaging results that were available during my care of the patient were reviewed by me and considered in my medical decision making (see chart for details).    MDM Rules/Calculators/A&P                          Thankfully, pt does not have a DVT.  She just has edema.  Pt has compression hose at home that she is told to wear.  She is to return if worse.  F/u with pcp.  Final Clinical Impression(s) / ED Diagnoses Final diagnoses:  Peripheral edema    Rx / DC  Orders ED Discharge Orders    None       Isla Pence, MD 11/03/20 2036

## 2020-11-03 NOTE — Telephone Encounter (Signed)
Pt states that she is having swelling in her leg/ankle and experiencing pain in her calf. She states that she has had a DVT after a prior surgery and these symptoms are similar. She has no complaints of redness or warmth but says that she didn't have those symptoms with her last DVT. Advised that she should go to ED for evaluation. Pt verbalized understanding.

## 2020-11-03 NOTE — ED Triage Notes (Signed)
Right posterior leg pain and right ankle swelling for 3 days.  Patient stated that it feels like a blood clot as per previous history.

## 2020-11-03 NOTE — Discharge Instructions (Addendum)
Wear your compression hose.  

## 2020-11-03 NOTE — Telephone Encounter (Signed)
Pt sent a mychart and I sent to Dr. [name]

## 2020-11-03 NOTE — Telephone Encounter (Signed)
Patient called stating she was concerns with leg sore and swellen  She thinks she may have a possible DVT.Heather [name] I offered her an appt with provider and insisted that she only wanted message sent to provider to call her back. She mentioned she had a recent female surgery on last Thursday and has been unable to reach her surgeon. Patient was upset that I offered an appointment and hung up.

## 2020-11-04 NOTE — Telephone Encounter (Signed)
Pt went to ER and they did an ultrasound

## 2020-11-07 ENCOUNTER — Other Ambulatory Visit: Payer: Self-pay

## 2020-11-07 ENCOUNTER — Ambulatory Visit: Payer: Medicare HMO

## 2020-11-07 DIAGNOSIS — R350 Frequency of micturition: Secondary | ICD-10-CM

## 2020-11-07 NOTE — Progress Notes (Signed)
Pt tried 2 more time to urinate and was unable to void. Pt was cathed with a 16 french cath and drained of 300cc in the bladder. Pt was scheduled for Thursday 11/10/20 to return for a voiding trial. Pt and daughter had no additional questions. They were notified that Dr Wannetta Sender will call them tomorrow.

## 2020-11-07 NOTE — Progress Notes (Signed)
Urogyn Nurse Voiding Trial Note  Heather [name] underwent posterior repair, sacrospinous ligament fixation, midurethral sling and cystoscopy on 10/27/20.  She presents today for a voiding trial .   Patient was identified with 2 indicators. 261ml of NS was instilled into the bladder via the catheter. The catheter was removed and patient was instructed to void into the urinary hat. She voided/ leaked 91 ml. The post void residual measured by bladder scan was 181ml. She failed the voiding trial and a catheter was replaced.   The patient received aftercare instructions and will follow up as scheduled.

## 2020-11-07 NOTE — Telephone Encounter (Signed)
Error

## 2020-11-08 ENCOUNTER — Encounter: Payer: Self-pay | Admitting: Obstetrics and Gynecology

## 2020-11-08 ENCOUNTER — Other Ambulatory Visit: Payer: Self-pay

## 2020-11-08 ENCOUNTER — Telehealth: Payer: Self-pay | Admitting: Obstetrics and Gynecology

## 2020-11-08 ENCOUNTER — Encounter (HOSPITAL_BASED_OUTPATIENT_CLINIC_OR_DEPARTMENT_OTHER): Payer: Self-pay | Admitting: Obstetrics and Gynecology

## 2020-11-08 NOTE — Progress Notes (Addendum)
Spoke w/ via phone for pre-op interview--- PT Lab needs dos----  Istat             Lab results------ current ekg/ cxr in epic/ chart COVID test ------ pt surgery is add-on for tomorrow 11-09-2020, pt does not drive and her daughter unable to get her to testing site today.  Pt will need rapid covid test dos. Aware when arrive call front desk, wait in car/ in parking lot, until receive call from [place] before come and check-in. Heather Landsman RN aware.  Arrive at -------  1000 on 05-042022 NPO after MN NO Solid Food.  Clear liquids from MN until--- 1100 Med rec completed Medications to take morning of surgery ----- Im Imdur, Norvasc, Prilosec, Mirapex Diabetic medication ----- n/a  Patient instructed to bring photo id and insurance card day of surgery Patient aware to have Driver (ride ) / caregiver  for 24 hours after surgery -- daughter, Heather [name] Patient Special Instructions ----- n/a  Pre-Op special Istructions ----- pt has cardiac telephone clearance dated 10-11-2020 by Sande Rives PA in epic/ chart;   Case just added on today, pre-op orders pending.    Patient verbalized understanding of instructions that were given at this phone interview. Patient denies  chest pain, fever, cough at this phone interview.   Anesthesia Review:  HTN;  Hx CAD with NSTEMI s/p PCI and DES 09/ 2006 x1 and 12/ 2006 x1;  Hx TIA (several in 2000) pt stated none since, never found cause, but ahs residual right arm/ hand tremors;  DM2 diet controlled;  GERD;  Pt denies any peripheral swelling.  Pt stated last took one nitro last week , had chest / jaw pain, resolved after few minutes;  Pt has sob with exertion (stairs, long distance) ok with household chores.   (11-08-2020 pt post op 2 wks sling procedure on 10-27-2020, has urinary retention, scheduled 11-09-2020 for revision.  Pt stated no changes in medical history other than right lower edema, pt had scan negative for dvt)  PCP:  Dr [name] ([place] 07-14-2020  epic) Cardiologist :  Dr Burt Knack ([place] 09-14-2020 epic) Chest x-ray : 09-05-2020 epic EKG :  08-26-2020 epic Echo :  08-26-2020 epic Stress test:  08-31-2020 epic Event monitor:  10-05-2020 epic Cardiac Cath : 2006 Activity level:  See above Sleep Study/ CPAP : NO Fasting Blood Sugar : 115--120s     / Checks Blood Sugar -- times a day:  Daily in am  Blood Thinner/ Instructions Maryjane Hurter Dose:  NO ASA / Instructions/ Last Dose : ASA 81 mg/ last dose today 11-08-2020

## 2020-11-08 NOTE — Telephone Encounter (Signed)
Called patient and discussed recent voiding trial. Since she was still unable to void on her own after almost 2 weeks post-op, would like to proceed with sling release. Will plan for 5/4 at 2pm at [facility]. Patient in agreement.

## 2020-11-09 ENCOUNTER — Ambulatory Visit (HOSPITAL_BASED_OUTPATIENT_CLINIC_OR_DEPARTMENT_OTHER)
Admission: RE | Admit: 2020-11-09 | Discharge: 2020-11-09 | Disposition: A | Discharge status: Home / Self Care | Payer: Medicare HMO | Attending: Obstetrics and Gynecology | Admitting: Obstetrics and Gynecology

## 2020-11-09 ENCOUNTER — Ambulatory Visit (HOSPITAL_BASED_OUTPATIENT_CLINIC_OR_DEPARTMENT_OTHER): Payer: Medicare HMO | Admitting: Anesthesiology

## 2020-11-09 ENCOUNTER — Encounter (HOSPITAL_BASED_OUTPATIENT_CLINIC_OR_DEPARTMENT_OTHER)
Admission: RE | Disposition: A | Discharge status: Home / Self Care | Payer: Self-pay | Source: Home / Self Care | Attending: Obstetrics and Gynecology

## 2020-11-09 ENCOUNTER — Encounter (HOSPITAL_BASED_OUTPATIENT_CLINIC_OR_DEPARTMENT_OTHER): Payer: Self-pay | Admitting: Obstetrics and Gynecology

## 2020-11-09 DIAGNOSIS — R338 Other retention of urine: Secondary | ICD-10-CM | POA: Diagnosis not present

## 2020-11-09 DIAGNOSIS — I2510 Atherosclerotic heart disease of native coronary artery without angina pectoris: Secondary | ICD-10-CM | POA: Insufficient documentation

## 2020-11-09 DIAGNOSIS — Z90710 Acquired absence of both cervix and uterus: Secondary | ICD-10-CM | POA: Insufficient documentation

## 2020-11-09 DIAGNOSIS — Z8719 Personal history of other diseases of the digestive system: Secondary | ICD-10-CM | POA: Diagnosis not present

## 2020-11-09 DIAGNOSIS — E119 Type 2 diabetes mellitus without complications: Secondary | ICD-10-CM | POA: Insufficient documentation

## 2020-11-09 DIAGNOSIS — Z823 Family history of stroke: Secondary | ICD-10-CM | POA: Diagnosis not present

## 2020-11-09 DIAGNOSIS — Z803 Family history of malignant neoplasm of breast: Secondary | ICD-10-CM | POA: Insufficient documentation

## 2020-11-09 DIAGNOSIS — D62 Acute posthemorrhagic anemia: Secondary | ICD-10-CM | POA: Diagnosis not present

## 2020-11-09 DIAGNOSIS — Z8541 Personal history of malignant neoplasm of cervix uteri: Secondary | ICD-10-CM | POA: Insufficient documentation

## 2020-11-09 DIAGNOSIS — Z8673 Personal history of transient ischemic attack (TIA), and cerebral infarction without residual deficits: Secondary | ICD-10-CM | POA: Diagnosis not present

## 2020-11-09 DIAGNOSIS — Z86718 Personal history of other venous thrombosis and embolism: Secondary | ICD-10-CM | POA: Insufficient documentation

## 2020-11-09 DIAGNOSIS — Z8711 Personal history of peptic ulcer disease: Secondary | ICD-10-CM | POA: Insufficient documentation

## 2020-11-09 DIAGNOSIS — Z881 Allergy status to other antibiotic agents status: Secondary | ICD-10-CM | POA: Diagnosis not present

## 2020-11-09 DIAGNOSIS — R339 Retention of urine, unspecified: Secondary | ICD-10-CM | POA: Diagnosis not present

## 2020-11-09 DIAGNOSIS — I252 Old myocardial infarction: Secondary | ICD-10-CM | POA: Diagnosis not present

## 2020-11-09 DIAGNOSIS — Z96653 Presence of artificial knee joint, bilateral: Secondary | ICD-10-CM | POA: Diagnosis not present

## 2020-11-09 DIAGNOSIS — Z888 Allergy status to other drugs, medicaments and biological substances status: Secondary | ICD-10-CM | POA: Insufficient documentation

## 2020-11-09 DIAGNOSIS — Z20822 Contact with and (suspected) exposure to covid-19: Secondary | ICD-10-CM | POA: Insufficient documentation

## 2020-11-09 DIAGNOSIS — Z833 Family history of diabetes mellitus: Secondary | ICD-10-CM | POA: Insufficient documentation

## 2020-11-09 DIAGNOSIS — Z955 Presence of coronary angioplasty implant and graft: Secondary | ICD-10-CM | POA: Insufficient documentation

## 2020-11-09 DIAGNOSIS — Z8249 Family history of ischemic heart disease and other diseases of the circulatory system: Secondary | ICD-10-CM | POA: Insufficient documentation

## 2020-11-09 DIAGNOSIS — E782 Mixed hyperlipidemia: Secondary | ICD-10-CM | POA: Diagnosis not present

## 2020-11-09 DIAGNOSIS — F418 Other specified anxiety disorders: Secondary | ICD-10-CM | POA: Diagnosis not present

## 2020-11-09 HISTORY — PX: PUBOVAGINAL SLING: SHX1035

## 2020-11-09 HISTORY — DX: Retention of urine, unspecified: R33.9

## 2020-11-09 HISTORY — PX: CYSTOSCOPY: SHX5120

## 2020-11-09 HISTORY — DX: Localized edema: R60.0

## 2020-11-09 LAB — POCT I-STAT, CHEM 8
BUN: 29 mg/dL — ABNORMAL HIGH (ref 8–23)
Calcium, Ion: 1.19 mmol/L (ref 1.15–1.40)
Chloride: 105 mmol/L (ref 98–111)
Creatinine, Ser: 1.4 mg/dL — ABNORMAL HIGH (ref 0.44–1.00)
Glucose, Bld: 135 mg/dL — ABNORMAL HIGH (ref 70–99)
HCT: 37 % (ref 36.0–46.0)
Hemoglobin: 12.6 g/dL (ref 12.0–15.0)
Potassium: 4.9 mmol/L (ref 3.5–5.1)
Sodium: 134 mmol/L — ABNORMAL LOW (ref 135–145)
TCO2: 21 mmol/L — ABNORMAL LOW (ref 22–32)

## 2020-11-09 LAB — RESP PANEL BY RT-PCR (FLU A&B, COVID) ARPGX2
Influenza A by PCR: NEGATIVE
Influenza B by PCR: NEGATIVE
SARS Coronavirus 2 by RT PCR: NEGATIVE

## 2020-11-09 LAB — GLUCOSE, CAPILLARY: Glucose-Capillary: 116 mg/dL — ABNORMAL HIGH (ref 70–99)

## 2020-11-09 SURGERY — CREATION, PUBOVAGINAL SLING
Anesthesia: General | Site: Vagina

## 2020-11-09 SURGERY — Surgical Case
Anesthesia: *Unknown

## 2020-11-09 MED ORDER — ONDANSETRON HCL 4 MG/2ML IJ SOLN
INTRAMUSCULAR | Status: DC | PRN
  Administered 2020-11-09: 4 mg via INTRAVENOUS

## 2020-11-09 MED ORDER — OXYCODONE HCL 5 MG PO TABS: 5.0000 mg | ORAL_TABLET | Freq: Once | ORAL | Status: DC | PRN

## 2020-11-09 MED ORDER — ROCURONIUM BROMIDE 10 MG/ML (PF) SYRINGE
PREFILLED_SYRINGE | INTRAVENOUS | Status: AC
  Filled 2020-11-09: qty 10

## 2020-11-09 MED ORDER — ACETAMINOPHEN 160 MG/5ML PO SOLN
325.0000 mg | ORAL | Status: DC | PRN
Start: 2020-11-09 — End: 2020-11-09

## 2020-11-09 MED ORDER — CEFAZOLIN SODIUM-DEXTROSE 2-4 GM/100ML-% IV SOLN
2.0000 g | INTRAVENOUS | Status: AC
  Administered 2020-11-09: 2 g via INTRAVENOUS

## 2020-11-09 MED ORDER — DEXAMETHASONE SODIUM PHOSPHATE 10 MG/ML IJ SOLN
INTRAMUSCULAR | Status: AC
  Filled 2020-11-09: qty 1

## 2020-11-09 MED ORDER — PROMETHAZINE HCL 25 MG/ML IJ SOLN
6.2500 mg | INTRAMUSCULAR | Status: DC | PRN
Start: 2020-11-09 — End: 2020-11-09

## 2020-11-09 MED ORDER — FENTANYL CITRATE (PF) 100 MCG/2ML IJ SOLN
INTRAMUSCULAR | Status: DC | PRN
  Administered 2020-11-09 (×2): 25 ug via INTRAVENOUS
  Administered 2020-11-09: 50 ug via INTRAVENOUS

## 2020-11-09 MED ORDER — SODIUM CHLORIDE 0.9 % IR SOLN
Status: DC | PRN
  Administered 2020-11-09: 3000 mL via INTRAVESICAL

## 2020-11-09 MED ORDER — LIDOCAINE 2% (20 MG/ML) 5 ML SYRINGE
INTRAMUSCULAR | Status: DC | PRN
  Administered 2020-11-09: 40 mg via INTRAVENOUS

## 2020-11-09 MED ORDER — LIDOCAINE-EPINEPHRINE 1 %-1:100000 IJ SOLN
INTRAMUSCULAR | Status: DC | PRN
  Administered 2020-11-09: 6 mL

## 2020-11-09 MED ORDER — EPHEDRINE SULFATE-NACL 50-0.9 MG/10ML-% IV SOSY
PREFILLED_SYRINGE | INTRAVENOUS | Status: DC | PRN
  Administered 2020-11-09: 10 mg via INTRAVENOUS
  Administered 2020-11-09: 5 mg via INTRAVENOUS
  Administered 2020-11-09: 10 mg via INTRAVENOUS

## 2020-11-09 MED ORDER — PROPOFOL 10 MG/ML IV BOLUS
INTRAVENOUS | Status: AC
  Filled 2020-11-09: qty 20

## 2020-11-09 MED ORDER — LACTATED RINGERS IV SOLN: INTRAVENOUS | Status: DC

## 2020-11-09 MED ORDER — FENTANYL CITRATE (PF) 100 MCG/2ML IJ SOLN
INTRAMUSCULAR | Status: AC
  Filled 2020-11-09: qty 2

## 2020-11-09 MED ORDER — LIDOCAINE 2% (20 MG/ML) 5 ML SYRINGE
INTRAMUSCULAR | Status: AC
  Filled 2020-11-09: qty 5

## 2020-11-09 MED ORDER — ACETAMINOPHEN 325 MG PO TABS: 325.0000 mg | ORAL_TABLET | ORAL | Status: DC | PRN

## 2020-11-09 MED ORDER — DEXAMETHASONE SODIUM PHOSPHATE 10 MG/ML IJ SOLN
INTRAMUSCULAR | Status: DC | PRN
  Administered 2020-11-09: 5 mg via INTRAVENOUS

## 2020-11-09 MED ORDER — ACETAMINOPHEN 10 MG/ML IV SOLN: 1000.0000 mg | Freq: Once | INTRAVENOUS | Status: DC | PRN

## 2020-11-09 MED ORDER — CEFAZOLIN SODIUM-DEXTROSE 2-4 GM/100ML-% IV SOLN
INTRAVENOUS | Status: AC
  Filled 2020-11-09: qty 100

## 2020-11-09 MED ORDER — FENTANYL CITRATE (PF) 100 MCG/2ML IJ SOLN: 25.0000 ug | INTRAMUSCULAR | Status: DC | PRN

## 2020-11-09 MED ORDER — PROPOFOL 10 MG/ML IV BOLUS
INTRAVENOUS | Status: DC | PRN
  Administered 2020-11-09: 130 mg via INTRAVENOUS

## 2020-11-09 MED ORDER — ONDANSETRON HCL 4 MG/2ML IJ SOLN
INTRAMUSCULAR | Status: AC
  Filled 2020-11-09: qty 2

## 2020-11-09 MED ORDER — OXYCODONE HCL 5 MG/5ML PO SOLN: 5.0000 mg | Freq: Once | ORAL | Status: DC | PRN

## 2020-11-09 SURGICAL SUPPLY — 39 items
ADH SKN CLS APL DERMABOND .7 (GAUZE/BANDAGES/DRESSINGS)
AGENT HMST KT MTR STRL THRMB (HEMOSTASIS)
APL ESCP 34 STRL LF DISP (HEMOSTASIS)
APPLICATOR SURGIFLO ENDO (HEMOSTASIS) IMPLANT
BLADE SURG 15 STRL LF DISP TIS (BLADE) ×2 IMPLANT
BLADE SURG 15 STRL SS (BLADE) ×3
CANISTER SUCT 3000ML PPV (MISCELLANEOUS) IMPLANT
COVER WAND RF STERILE (DRAPES) ×3 IMPLANT
DECANTER SPIKE VIAL GLASS SM (MISCELLANEOUS) ×3 IMPLANT
DERMABOND ADVANCED (GAUZE/BANDAGES/DRESSINGS)
DERMABOND ADVANCED .7 DNX12 (GAUZE/BANDAGES/DRESSINGS) ×2 IMPLANT
GLOVE SURG ENC MOIS LTX SZ6 (GLOVE) ×3 IMPLANT
GLOVE SURG LTX SZ6 (GLOVE) ×3 IMPLANT
GLOVE SURG UNDER POLY LF SZ6.5 (GLOVE) ×3 IMPLANT
GLOVE SURG UNDER POLY LF SZ7 (GLOVE) ×3 IMPLANT
GOWN STRL REUS W/TWL LRG LVL3 (GOWN DISPOSABLE) ×8 IMPLANT
HIBICLENS CHG 4% 4OZ (MISCELLANEOUS) ×3 IMPLANT
IV NS IRRIG 3000ML ARTHROMATIC (IV SOLUTION) ×1 IMPLANT
KIT TURNOVER CYSTO (KITS) ×3 IMPLANT
NEEDLE HYPO 22GX1.5 SAFETY (NEEDLE) ×3 IMPLANT
NS IRRIG 1000ML POUR BTL (IV SOLUTION) ×2 IMPLANT
PACK VAGINAL WOMENS (CUSTOM PROCEDURE TRAY) ×3 IMPLANT
RETRACTOR LONE STAR DISPOSABLE (INSTRUMENTS) ×3 IMPLANT
RETRACTOR STAY HOOK 5MM (MISCELLANEOUS) ×1 IMPLANT
SET IRRIG Y TYPE TUR BLADDER L (SET/KITS/TRAYS/PACK) ×3 IMPLANT
SUCTION FRAZIER HANDLE 10FR (MISCELLANEOUS) ×3
SUCTION TUBE FRAZIER 10FR DISP (MISCELLANEOUS) IMPLANT
SURGIFLO W/THROMBIN 8M KIT (HEMOSTASIS) IMPLANT
SUT ABS MONO DBL WITH NDL 48IN (SUTURE) IMPLANT
SUT CAPIO PGA 48IN SZ 0 (SUTURE) IMPLANT
SUT MON AB 2-0 SH 27 (SUTURE) IMPLANT
SUT VIC AB 0 CT1 27 (SUTURE) ×3
SUT VIC AB 0 CT1 27XBRD ANTBC (SUTURE) ×2 IMPLANT
SUT VICRYL 2-0 SH 8X27 (SUTURE) IMPLANT
SYR BULB EAR ULCER 3OZ GRN STR (SYRINGE) ×3 IMPLANT
TOWEL OR 17X26 10 PK STRL BLUE (TOWEL DISPOSABLE) ×3 IMPLANT
TRAY FOL W/BAG SLVR 16FR STRL (SET/KITS/TRAYS/PACK) ×2 IMPLANT
TRAY FOLEY W/BAG SLVR 14FR (SET/KITS/TRAYS/PACK) ×3 IMPLANT
TRAY FOLEY W/BAG SLVR 16FR LF (SET/KITS/TRAYS/PACK) ×3

## 2020-11-09 NOTE — H&P (Signed)
[facility] Pre-Operative H&P  Subjective Chief Complaint: Heather [name] presents for a preoperative encounter.   History of Present Illness: Heather [name] is a 86 y.o. female who presents for preoperative visit.  She is s/p Exam under anesthesia, posterior repair, sacrospinous ligament fixation, midurethral sling and cystoscopy on 10/27/20. She has not been able to void since surgery and currently has the catheter in place so decision was made for a sling revision/ release.  .   Pre-op Urodynamics showed: 1. Sensation wasincreased; capacity wasreduced 2. Stress Incontinencewasdemonstrated at lownormalpressures; 3. Detrusor Overactivitywasdemonstrated withoutleakage. 4. Emptying wasnormalwith a normalPVR, a sustained detrusor contraction present, abdominal straining present,normalurethral sphincter activity on EMG.  Past Medical History:  Diagnosis Date  . Chronic stable angina ([place])   . Coronary artery disease cardiologist--- dr Burt Knack   hx NSTEMI  w/ cardiac cath 03-16-2005 PCI with DES to LCx;   cardiac cath 06-27-2005  PCI with DES to RCA with residual dx LAD manage medcially;  lexiscan 01-26-20211 normal no ischemia, ef 70%;  stress echo w/ dobutamine 10-24-2011 negative ishcmeia , normal ef // Myoview 2/22: EF 67, no ischemia or infarction, no TID, low risk   . Diabetes mellitus type 2, diet-controlled ([place])    followed by pcp  (10-19-2020  pt stated checks daily in am,  fasting blood surgar--- 115--120s)  . DOE (dyspnea on exertion)    per pt "when I over do",  ok with household chores  . Echocardiogram 08/2020    Echocardiogram 2/22: EF 55-60, no RWMA, mild LVH, Gr 2 DD, GLS-21.7%, normal RVSF, trivial MR, RVSP 39.5  . Edema of right lower extremity   . GERD (gastroesophageal reflux disease)   . Hiatal hernia    recurrence,  hx HH repair 1989  . History of cervical cancer    s/p  vaginal hysterectomy  . History of DVT of lower extremity 2016    11-29-2014 post op right TKA of right lower extremity and completed xarelto   . History of esophageal stricture    hx s/p dilatation's  . History of gastric ulcer 2005 approx.  Marland Kitchen History of palpitations 2010   event monitor 07-07-2009 showed NSR w/ freq. SVT ectopies with short runs, rare PVCs  . History of TIA (transient ischemic attack) 06/1999   12-15-2019  per pt had several TIA between 12/ 2000 to 02/ 2001 , was sent to specialist @[facility] , had test that was normal (10-19-2020 pt stated no TIAs since ) but has residual of essential tremors of right arm/ hand  . Hypertension   . Intermittent palpitations   . IT band syndrome   . Migraine    "ice pick headche lasts about 30 seconds"  . Mixed hyperlipidemia   . Mixed incontinence urge and stress   . Multiple thyroid nodules    followed by pcp---   ultrasound 11-22-2014 no bx   (12-15-2019 per pt had a endocrinologist and was told did not need bx)  . OA (osteoarthritis)    knees, elbow, hip, ankles  . Occasional tremors    right arm/ hand  s/p TIA residual 2000  . Osteoporosis    taking vitamin d  . Right bundle branch block (RBBB) with left anterior fascicular block (LAFB)   . RLS (restless legs syndrome)   . S/P drug eluting coronary stent placement 2006   03-16-2005  PCI x1 DES to LCx;   06-27-2005  PCI x1 DES to RCA  . Urinary retention    post  op sling prodecure on 10-27-2020, has foley cathether     Past Surgical History:  Procedure Laterality Date  . ANTERIOR AND POSTERIOR REPAIR N/A 12/22/2019   Procedure: ANTERIOR (CYSTOCELE)  REPAIR;  Surgeon: Janyth Contes, MD;  Location: [facility];  Service: Gynecology;  Laterality: N/A;  . ANTERIOR AND POSTERIOR REPAIR WITH SACROSPINOUS FIXATION N/A 10/27/2020   Procedure: SACROSPINOUS LIGAMENT FIXATION;  Surgeon: Jaquita Folds, MD;  [facility];  Service: Gynecology;  Laterality: N/A;  . BLADDER SUSPENSION N/A 10/27/2020    Procedure: TRANSVAGINAL TAPE (TVT) PROCEDURE;  Surgeon: Jaquita Folds, MD;  Location: [facility];  Service: Gynecology;  Laterality: N/A;  . CATARACT EXTRACTION W/ INTRAOCULAR LENS  IMPLANT, BILATERAL  2015  . COLONOSCOPY  last one ?  . CORONARY ANGIOPLASTY WITH STENT PLACEMENT  03-16-2005   dr Lia Foyer   PCI and DES x1 to LCx  . CORONARY ANGIOPLASTY WITH STENT PLACEMENT  06-27-2005  dr Lia Foyer   PCI and DES x1 to RCA with residual disease LAD 70-80% to manage medically  . CYSTOSCOPY N/A 10/27/2020   Procedure: CYSTOSCOPY;  Surgeon: Jaquita Folds, MD;  Location: [facility];  Service: Gynecology;  Laterality: N/A;  . FOOT SURGERY Left 1990s   left foot stress fracture repair, per pt no hardware  . HIATAL HERNIA REPAIR  1989  . KNEE ARTHROSCOPY Bilateral right ?/   left x2 , last one 09-12-2009 @ [facility]  . RECTOCELE REPAIR N/A 04/20/2020   Procedure: POSTERIOR REPAIR (RECTOCELE);  Surgeon: Janyth Contes, MD;  Location: [facility];  Service: Gynecology;  Laterality: N/A;  . RECTOCELE REPAIR N/A 10/27/2020   Procedure: POSTERIOR REPAIR (RECTOCELE);  Surgeon: Jaquita Folds, MD;  Location: [facility];  Service: Gynecology;  Laterality: N/A;  total time requested for all procedures is 2 hours  . TOTAL KNEE ARTHROPLASTY  11/12/2011   Procedure: TOTAL KNEE ARTHROPLASTY;  Surgeon: Lorn Junes, MD;  Location: [facility];  Service: Orthopedics;  Laterality: Left;  Dr Noemi Chapel wants 90 minutes for this case  . TOTAL KNEE ARTHROPLASTY Right 11/29/2014   Procedure: RIGHT TOTAL KNEE ARTHROPLASTY;  Surgeon: Vickey Huger, MD;  Location: [facility];  Service: Orthopedics;  Laterality: Right;  . UPPER GASTROINTESTINAL ENDOSCOPY  last one 04-25-2017   with dilatation esophageal stricture and savary dilatation  . VAGINAL HYSTERECTOMY  1988    no ovaries removed for bleeeding    is allergic to baclofen and nitrofurantoin.   Family  History  Problem Relation Age of Onset  . Stroke Father        family hx of M 1st degree relative <50  . Coronary artery disease Mother   . Heart disease Mother   . Depression Brother   . Stroke Brother   . Diabetes Brother   . Cancer Brother        bladder with mets  . Diabetes Daughter        borderline  . Hypertension Daughter   . Arthritis Other        family hx of  . Hypertension Other        family hx of  . Other Other        family hx of cardiovascular disorder  . Thyroid disease Daughter   . Breast cancer Neg Hx   . Colon cancer Neg Hx   . Anesthesia problems Neg Hx   . Hypotension Neg Hx   . Malignant  hyperthermia Neg Hx   . Pseudochol deficiency Neg Hx   . Colon polyps Neg Hx   . Esophageal cancer Neg Hx   . Rectal cancer Neg Hx   . Stomach cancer Neg Hx     Social History   Tobacco Use  . Smoking status: Never Smoker  . Smokeless tobacco: Never Used  Vaping Use  . Vaping Use: Never used  Substance Use Topics  . Alcohol use: No    Alcohol/week: 0.0 standard drinks  . Drug use: Never     Review of Systems was negative for a full 10 system review except as noted in the History of Present Illness.   Current Facility-Administered Medications:  .  ceFAZolin (ANCEF) IVPB 2g/100 mL premix, 2 g, Intravenous, On Call to OR, Jaquita Folds, MD .  lactated ringers infusion, , Intravenous, Continuous, Ellender, Karyl Kinnier, MD, Last Rate: 50 mL/hr at 11/09/20 1210, New Bag at 11/09/20 1210   Objective Gen: No distress, AAO x3  Previous Pelvic Exam showed: POP-Q (09/21/20):   POP-Q  -2 Aa  -2 Ba  -4 C   3 Gh  5 Pb  7 tvl   0 Ap   0 Bp   D      Assessment/ Plan  Assessment: The patient is a 86 y.o. year old with s/p posterior repair, sacrospinous ligament fixation, midurethral sling and cystoscopy now with post-operative urinary retention.  Plan: Exam under anesthesia, sling revision/ release and cystoscopy   Jaquita Folds, MD

## 2020-11-09 NOTE — Op Note (Signed)
Operative Note  Preoperative Diagnosis: acute post-operative urinary retention  Postoperative Diagnosis: same  Procedures performed:  Sling revision and cystosocpy  Attending Surgeon: Sherlene Shams, MD  Anesthesia: General LMA  Findings: 1. Previously placed midurethral sling was noted to be distal and on tension.   2. On cystoscopy, normal bladder and urethra without injury, lesion or foreign body. Brisk bilateral ureteral efflux noted.     Specimens: * No specimens in log *  Estimated blood loss: 30 mL  IV fluids: 800 mL  Urine output: 097 mL  Complications: none  Indications: Patient is approximately 2 weeks s/p midurethral sling placement and prolapse repair. She unable to pass several voiding trials and could not void at all. The decision was made to proceed with a sling revision/ release.   Procedure in Detail: After informed consent was obtained the patient was taken the operating room where general anesthesia was induced and found to be adequate.  She is placed in dorsal lithotomy position taking care to avoid any traction on the extremities and prepped and draped in the usual sterile fashion.  A Foley catheter was placed into the bladder.  A [place] retractor was placed with 4 elastic blue stays.  The suburethral sutures were identified and removed.  The suburethral area was injected with 1% lidocaine with epinephrine.  An incision was made in the mid urethral area with a 15 blade scalpel.  Dissection was performed with Metzenbaum scissors to remove the underlying tissue from the vaginal mucosa.  Further dissection was performed to identify the sling.  The sling was noted to be very distal, rolled and on tension.  The sling was grasped with an Allis clamp and dissection was performed to undermine the sling.  The sling was cut in the midline and noted to to retract.  Further dissection was performed along the sling arms to loosen this area.  A small amount of sling was  removed from each side.  The foley catheter was removed. A cystoscopy was then performed with a 70 degree 19 French cystoscope with sterile water.  A 360 degree view of the bladder revealed no bladder injury or foreign bodies.  Trabeculations were noted throughout the bladder.  Brisk bilateral ureteral and  fflux was noted.  The cystoscope was removed performing a urethroscopy on the way out and no urethral injury was noted.  The bladder was then drained and the Foley catheter was replaced the incision was closed with a 2-0 Vicryl in a running fashion.  Irrigation was performed a small amount of bleeding was noted on the posterior vaginal sutures from the prior surgery.  This area was cauterized with the Bovie and excellent hemostasis was obtained.  The patient tolerated the procedure well and was taken to recovery room in stable condition needle and sponge counts were correct x2.  Jaquita Folds, MD

## 2020-11-09 NOTE — Transfer of Care (Signed)
Immediate Anesthesia Transfer of Care Note  Patient: ADLENE ADDUCI  Procedure(s) Performed: Procedure(s) (LRB): REVISION OF PUBO-VAGINAL SLING (N/A) CYSTOSCOPY (N/A)  Patient Location: PACU  Anesthesia Type: General  Level of Consciousness: awake, oriented, sedated and patient cooperative  Airway & Oxygen Therapy: Patient Spontanous Breathing and Patient connected to face mask oxygen  Post-op Assessment: Report given to PACU RN and Post -op Vital signs reviewed and stable  Post vital signs: Reviewed and stable  Complications: No apparent anesthesia complications  Last Vitals:  Vitals Value Taken Time  BP 152/51 11/09/20 1500  Temp    Pulse 70 11/09/20 1501  Resp 19 11/09/20 1501  SpO2 98 % 11/09/20 1501  Vitals shown include unvalidated device data.  Last Pain:  Vitals:   11/09/20 1143  PainSc: 6       Patients Stated Pain Goal: 5 (93/90/30 0923)  Complications: No complications documented.

## 2020-11-09 NOTE — Anesthesia Preprocedure Evaluation (Addendum)
Anesthesia Evaluation  Patient identified by MRN, date of birth, ID band Patient awake    Reviewed: Allergy & Precautions, NPO status , Patient's Chart, lab work & pertinent test results  Airway Mallampati: III  TM Distance: <3 FB Neck ROM: Full    Dental  (+) Teeth Intact, Dental Advisory Given   Pulmonary    Pulmonary exam normal        Cardiovascular hypertension, Pt. on medications and Pt. on home beta blockers + CAD and + Cardiac Stents  + dysrhythmias  Rhythm:Regular Rate:Normal     Neuro/Psych  Headaches, PSYCHIATRIC DISORDERS Anxiety Depression    GI/Hepatic Neg liver ROS, hiatal hernia, GERD  Medicated,  Endo/Other  diabetes, Type 2  Renal/GU      Musculoskeletal   Abdominal Normal abdominal exam  (+)   Peds  Hematology   Anesthesia Other Findings   Reproductive/Obstetrics                            Anesthesia Physical Anesthesia Plan  ASA: III  Anesthesia Plan: General   Post-op Pain Management:    Induction: Intravenous  PONV Risk Score and Plan: 4 or greater and Ondansetron and Treatment may vary due to age or medical condition  Airway Management Planned: Oral ETT  Additional Equipment: None  Intra-op Plan:   Post-operative Plan: Extubation in OR  Informed Consent: I have reviewed the patients History and Physical, chart, labs and discussed the procedure including the risks, benefits and alternatives for the proposed anesthesia with the patient or authorized representative who has indicated his/her understanding and acceptance.     Dental advisory given  Plan Discussed with: CRNA  Anesthesia Plan Comments: (Echo:  1. Left ventricular ejection fraction, by estimation, is 55 to 60%. Left  ventricular ejection fraction by 3D volume is 61 %. The left ventricle has  normal function. The left ventricle has no regional wall motion  abnormalities. There is mild  concentric  left ventricular hypertrophy. Left ventricular diastolic parameters are  consistent with Grade II diastolic dysfunction (pseudonormalization). The  average left ventricular global longitudinal strain is -21.7 %. The global  longitudinal strain is normal.  2. Right ventricular systolic function is normal. The right ventricular  size is normal. There is mildly elevated pulmonary artery systolic  pressure.  3. The mitral valve is abnormal. Trivial mitral valve regurgitation. No  evidence of mitral stenosis.  4. The aortic valve is tricuspid. There is mild calcification of the  aortic valve. There is mild thickening of the aortic valve. Aortic valve  regurgitation is not visualized. No aortic stenosis is present.  5. The inferior vena cava is normal in size with greater than 50%  respiratory variability, suggesting right atrial pressure of 3 mmHg.  6. Mechanism of mild tricuspid regurgitation suggestive of prolapse. In  the setting eccentric jet, study may underestimate the severity of the  regurgitation.M )      Anesthesia Quick Evaluation

## 2020-11-09 NOTE — Discharge Instructions (Signed)
POST OPERATIVE INSTRUCTIONS  General Instructions . Recovery (not bed rest) will last approximately 6 weeks . Walking is encouraged, but refrain from strenuous exercise/ housework/ heavy lifting. o No lifting >10lbs  . Nothing in the vagina- NO intercourse, tampons or douching . Bathing:  Do not submerge in water (NO swimming, bath, hot tub, etc) until after your postop visit. You can shower starting the day after surgery.  . No driving until you are not taking narcotic pain medicine and until your pain is well enough controlled that you can slam on the breaks or make sudden movements if needed.   Taking your medications 1. Please take your acetaminophen and ibuprofen on a schedule for the first 48 hours. Take 600mg ibuprofen, then take 500mg acetaminophen 3 hours later, then continue to alternate ibuprofen and acetaminophen. That way you are taking each type of medication every 6 hours. 2. Take the prescribed narcotic (oxycodone, tramadol, etc) as needed, with a maximum being every 4 hours.  3. Take a stool softener daily to keep your stools soft and preventing you from straining. If you have diarrhea, you decrease your stool softener. This is explained more below. We have prescribed you Miralax.  Reasons to Call the Nurse (see last page for phone numbers) . Heavy Bleeding (changing your pad every 1-2 hours) . Persistent nausea/vomiting . Fever (100.4 degrees or more) . Incision problems (pus or other fluid coming out, redness, warmth, increased pain)  Things to Expect After Surgery . Mild to Moderate pain is normal during the first day or two after surgery. If prescribed, take Ibuprofen or Tylenol first and use the stronger medicine for "break-through" pain. You can overlap these medicines because they work differently.   . Constipation   . To Prevent Constipation:  Eat a well-balanced diet including protein, grains, fresh fruit and vegetables.  Drink plenty of fluids. Walk regularly.   Depending on specific instructions from your physician: take Miralax daily and additionally you can add a stool softener (colace/ docusate) and fiber supplement. Continue as long as you're on pain medications.   . To Treat Constipation:  If you do not have a bowel movement in 2 days after surgery, you can take 2 Tbs of Milk of Magnesia 1-2 times a day until you have a bowel movement. If diarrhea occurs, decrease the amount or stop the laxative. If no results with Milk of Magnesia, you can drink a bottle of magnesium citrate which you can purchase over the counter.  . Fatigue:  This is a normal response to surgery and will improve with time.  Plan frequent rest periods throughout the day.  . Gas Pain:  This is very common but can also be very painful! Drink warm liquids such as herbal teas, bouillon or soup. Walking will help you pass more gas.  Mylicon or Gas-X can be taken over the counter.  . Leaking Urine:  Varying amounts of leakage may occur after surgery.  This should improve with time. Your bladder needs at least 3 months to recover from surgery. If you leak after surgery, be sure to mention this to your doctor at your post-op visit. If you were taking medications for overactive bladder prior to surgery, be sure to restart the medications immediately after surgery.  . Incisions: If you have incisions on your abdomen, the skin glue will dissolve on its own over time. It is ok to gently rinse with soap and water over these incisions but do not scrub.  Catheter Approximately 50%   of patients are unable to urinate after surgery and need to go home with a catheter. This allows your bladder to rest so it can return to full function. If you go home with a catheter, the office will call to set up a voiding trial a few days after surgery. For most patients, by this visit, they are able to urinate on their own. Long term catheter use is rare.   Return to Work  As work demands and recovery times vary  widely, it is hard to predict when you will want to return to work. If you have a desk job with no strenuous physical activity, and if you would like to return sooner than generally recommended, discuss this with your provider or call our office.   Post op concerns  For non-emergent issues, please call the Urogynecology Nurse. Please leave a message and someone will contact you within one business day.  You can also send a message through MyChart.   AFTER HOURS (After 5:00 PM and on weekends):  For urgent matters that cannot wait until the next business day. Call our office [phone] and connect to the doctor on call.  Please reserve this for important issues.   **FOR ANY TRUE EMERGENCY ISSUES CALL 911 OR GO TO THE NEAREST EMERGENCY ROOM.** Please inform our office or the doctor on call of any emergency.     APPOINTMENTS: Call [phone]  Post Anesthesia Home Care Instructions  Activity: Get plenty of rest for the remainder of the day. A responsible individual must stay with you for 24 hours following the procedure.  For the next 24 hours, DO NOT: -Drive a car -Operate machinery -Drink alcoholic beverages -Take any medication unless instructed by your physician -Make any legal decisions or sign important papers.  Meals: Start with liquid foods such as gelatin or soup. Progress to regular foods as tolerated. Avoid greasy, spicy, heavy foods. If nausea and/or vomiting occur, drink only clear liquids until the nausea and/or vomiting subsides. Call your physician if vomiting continues.  Special Instructions/Symptoms: Your throat may feel dry or sore from the anesthesia or the breathing tube placed in your throat during surgery. If this causes discomfort, gargle with warm salt water. The discomfort should disappear within 24 hours.       

## 2020-11-09 NOTE — Anesthesia Procedure Notes (Signed)
Procedure Name: LMA Insertion Date/Time: 11/09/2020 2:06 PM Performed by: Suan Halter, CRNA Pre-anesthesia Checklist: Patient identified, Emergency Drugs available, Suction available and Patient being monitored Patient Re-evaluated:Patient Re-evaluated prior to induction Oxygen Delivery Method: Circle system utilized Preoxygenation: Pre-oxygenation with 100% oxygen Induction Type: IV induction Ventilation: Mask ventilation without difficulty LMA: LMA inserted LMA Size: 4.0 Number of attempts: 1 Airway Equipment and Method: Bite block Placement Confirmation: positive ETCO2 Tube secured with: Tape Dental Injury: Teeth and Oropharynx as per pre-operative assessment

## 2020-11-10 ENCOUNTER — Encounter: Payer: Self-pay | Admitting: Obstetrics and Gynecology

## 2020-11-10 ENCOUNTER — Telehealth: Payer: Self-pay | Admitting: Obstetrics and Gynecology

## 2020-11-10 ENCOUNTER — Ambulatory Visit: Payer: Medicare HMO

## 2020-11-10 ENCOUNTER — Telehealth: Payer: Self-pay | Admitting: Pharmacist

## 2020-11-10 NOTE — Telephone Encounter (Signed)
Heather [name] underwent a sling revision/ release and cystoscopy on 11/09/20.   She passed her voiding trial.  336ml was backfilled into the bladder Void- unmeasured as patient leaked the majority of instilled urine PVR by bladder scan was 160ml.   She was discharged without a catheter. Please call her for a routine post op check. Please enquire if she would like a medication for her urinary leakage.  Jaquita Folds, MD

## 2020-11-10 NOTE — Chronic Care Management (AMB) (Signed)
Chronic Care Management Pharmacy Assistant   Name: Heather [name]  MRN: [id number] DOB: October 14, 1934 t.  Reason for Encounter: Disease State-Hypertension    Recent office visits:  07/14/20- [name], MD(PCP)  Recent consult visits:  11/08/20- Sherlene Shams, MD(Gynecology)- sling revision/ release and cystoscopy 09/21/20-Michelle Wannetta Sender, MD(urogynecology) 09/19/20-Jody Bovard-Stuckert (OBGYN) 09/14/20- [name], PA-C(Cardiology) 09/08/20-Jody Bovard-Stuckert (OBGYN) 08/19/20- [name], PA-C(Cardiology) 08/17/20- Erle Crocker (Physical Therapy) 08/09/20-Keith Nice (Optometry) 08/08/20- Jomarie Longs (Optometry) 08/01/20- Erle Crocker (Physical Therapy) 07/27/20- Edmonia Lynch (Orthopedic Surgery) 07/22/20-Timothy Percell Miller (Orthopedic Surgery)        Hospital visits:  Medication Reconciliation was completed by comparing discharge summary, patient's EMR and Pharmacy list, and upon discussion with patient.  Admitted to the hospital on 11/03/20 due to RLE pain and swelling. Discharge date was 11/03/20. Discharged from [place]?Medications Started at [facility] Discharge:?? -started None noted  Medication Changes at Hospital Discharge: -Changed None noted  Medications Discontinued at Hospital Discharge: -Stopped None noted  Medications that remain the same after Hospital Discharge:??  -All other medications will remain the same.     Hospital visits:  Medication Reconciliation was completed by comparing discharge summary, patient's EMR and Pharmacy list, and upon discussion with patient.  Admitted to the hospital on 08/26/20 due to Chest pain. Discharge date was 08/29/20. Discharged from [place]?Medications Started at [facility] Discharge:?? -started None noted  Medication Changes at Hospital Discharge: -Changed None noted  Medications Discontinued at Hospital Discharge: -Stopped None noted  Medications  that remain the same after Hospital Discharge:??  -All other medications will remain the same.   Medications: Outpatient Encounter Medications as of 11/10/2020  Medication Sig Note  . acetaminophen (TYLENOL) 500 MG tablet Take 1,000 mg by mouth every 6 (six) hours as needed.   Marland Kitchen amLODipine (NORVASC) 10 MG tablet Take 1 tablet (10 mg total) by mouth daily. (Patient taking differently: Take 10 mg by mouth daily.)   . Ascorbic Acid (VITAMIN C) 500 MG CHEW Chew by mouth daily.   Marland Kitchen aspirin EC 81 MG tablet Take 1 tablet (81 mg total) by mouth daily.   . Blood Glucose Monitoring Suppl (TRUE METRIX AIR GLUCOSE METER) w/Device KIT USE TO CHECK BLOOD SUGAR ONCE DAILY AND AS NEEDED.  DX CODE E11.9   . Cholecalciferol (VITAMIN D) 50 MCG (2000 UT) CAPS Take 2,000 Units by mouth daily.    . famotidine (PEPCID) 20 MG tablet Take 20 mg by mouth at bedtime as needed for heartburn or indigestion.   . gabapentin (NEURONTIN) 100 MG capsule Take 1 capsule (100 mg total) by mouth 3 (three) times daily.   Marland Kitchen glucose blood (TRUE METRIX BLOOD GLUCOSE TEST) test strip USE TO CHECK BLOOD SUGAR ONCE A DAY OR AS NEEDED.  DX CODE: E11.9   . hyoscyamine (LEVSIN SL) 0.125 MG SL tablet Place 1 tablet (0.125 mg total) under the tongue every 4 (four) hours as needed.   . isosorbide mononitrate (IMDUR) 60 MG 24 hr tablet Take 1 tablet (60 mg total) by mouth daily.   Marland Kitchen ketorolac (TORADOL) 10 MG tablet Take 1 tablet (10 mg total) by mouth every 6 (six) hours as needed.   Marland Kitchen losartan (COZAAR) 100 MG tablet Take 1 tablet (100 mg total) by mouth daily.   . magnesium oxide (MAG-OX) 400 MG tablet Take 400 mg by mouth 2 (two) times daily.   . metoprolol succinate (TOPROL-XL) 50 MG 24 hr tablet Take 1.5 tablets (75 mg total) by  mouth at bedtime. Take with or immediately following a meal. (Patient taking differently: Take 75 mg by mouth at bedtime. Take with or immediately following a meal.)   . Multiple Vitamins-Minerals (ZINC PO) Take by mouth  daily.   . nitroGLYCERIN (NITROSTAT) 0.4 MG SL tablet Place 1 tablet (0.4 mg total) under the tongue every 5 (five) minutes as needed for chest pain.   Marland Kitchen omeprazole (PRILOSEC) 20 MG capsule Take 1 capsule (20 mg total) by mouth daily as needed. (Patient taking differently: Take 20 mg by mouth daily.)   . ondansetron (ZOFRAN ODT) 4 MG disintegrating tablet Take 1 tablet (4 mg total) by mouth every 6 (six) hours as needed for nausea or vomiting. (Patient not taking: No sig reported)   . polyethylene glycol powder (GAVILAX) 17 GM/SCOOP powder Take 17 g by mouth daily. (Patient taking differently: Take 17 g by mouth as directed. As directed by dr schroeder, pre-op for surgery on 10-27-2020) 10/27/2020: Pt has not taken  . potassium chloride SA (KLOR-CON) 20 MEQ tablet Take 0.5 tablets (10 mEq total) by mouth daily.   . pramipexole (MIRAPEX) 0.25 MG tablet Take 1 tablet (0.25 mg total) by mouth 2 (two) times daily. (Patient taking differently: Take 0.25 mg by mouth 2 (two) times daily.)   . promethazine (PHENERGAN) 12.5 MG tablet Take 1 tablet (12.5 mg total) by mouth every 8 (eight) hours as needed for nausea or vomiting.   . simvastatin (ZOCOR) 20 MG tablet TAKE 1 TABLET AT BEDTIME (Patient taking differently: Take by mouth at bedtime.)   . spironolactone (ALDACTONE) 25 MG tablet Take 0.5 tablets (12.5 mg total) by mouth 3 (three) times a week. (Patient taking differently: Take 12.5 mg by mouth 3 (three) times a week. Monday /  Wednesday/ Friday)   . TRUEplus Lancets 33G MISC USE TO CHECK BLOOD SUGAR ONCE DAILY AND AS NEEDED.  DX CODE: E11.9    No facility-administered encounter medications on file as of 11/10/2020.   Reviewed chart prior to disease state call. Spoke with patient regarding BP  Recent Office Vitals: BP Readings from Last 3 Encounters:  11/09/20 (!) 137/50  11/03/20 124/71  10/31/20 132/73   Pulse Readings from Last 3 Encounters:  11/09/20 (!) 59  11/03/20 89  10/31/20 (!) 56     Wt Readings from Last 3 Encounters:  11/09/20 160 lb 3.2 oz (72.7 kg)  11/03/20 159 lb 4.5 oz (72.3 kg)  10/27/20 159 lb 4.8 oz (72.3 kg)     Kidney Function Lab Results  Component Value Date/Time   CREATININE 1.40 (H) 11/09/2020 12:16 PM   CREATININE 1.00 10/27/2020 11:19 AM   CREATININE 0.95 11/10/2013 03:28 AM   CREATININE 1.03 01/17/2012 11:03 AM   GFR 56.01 (L) 07/14/2020 09:21 AM   GFRNONAA 49 (L) 08/26/2020 09:59 PM   GFRAA 57 (L) 08/19/2020 01:51 PM    BMP Latest Ref Rng & Units 11/09/2020 10/27/2020 10/03/2020  Glucose 70 - 99 mg/dL 135(H) 165(H) 148(H)  BUN 8 - 23 mg/dL 29(H) 23 23  Creatinine 0.44 - 1.00 mg/dL 1.40(H) 1.00 0.96  BUN/Creat Ratio 12 - 28 - - 24  Sodium 135 - 145 mmol/L 134(L) 136 139  Potassium 3.5 - 5.1 mmol/L 4.9 4.6 4.5  Chloride 98 - 111 mmol/L 105 104 102  CO2 20 - 29 mmol/L - - 21  Calcium 8.7 - 10.3 mg/dL - - 9.4    . Current antihypertensive regimen:  o Amlodipine 10 mg Take 1 tablet daily o  Losartan 100 mg Take 1 tablet daily o Metoprolol Succinate 50 mg Take 1.5 tablets at bedtime o Spironolactone 25 mg Take 0.5 tablets 3 times a week  . How often are you checking your Blood Pressure? infrequently due to amount of physician visits per patient  . Current home BP readings: Patient states she has not been checking he blood pressure.  . What recent interventions/DTPs have been made by any provider to improve Blood Pressure control since last CPP Visit: None noted  . Any recent hospitalizations or ED visits since last visit with CPP? Yes   . What diet changes have been made to improve Blood Pressure Control?  o Patient states she only eats 2 meals a day but has not made any changes to her diet.  . What exercise is being done to improve your Blood Pressure Control?  o Patient states she has not been able to exercise due to her health.  Adherence Review: Is the patient currently on ACE/ARB medication? Yes Does the patient have >5 day gap  between last estimated fill dates? No    Star Rating Drugs: Losartan 100 mg last filled 10/31/20 90 DS Simvastatin 20 mg last filled 09/02/20 90 DS   [facility] Clinical Pharmacist Assistant [phone]

## 2020-11-10 NOTE — Anesthesia Postprocedure Evaluation (Signed)
Anesthesia Post Note  Patient: Heather [name]  Procedure(s) Performed: REVISION OF PUBO-VAGINAL SLING (N/A Vagina ) CYSTOSCOPY (N/A Bladder)     Patient location during evaluation: PACU Anesthesia Type: General Level of consciousness: awake and alert Pain management: pain level controlled Vital Signs Assessment: post-procedure vital signs reviewed and stable Respiratory status: spontaneous breathing, nonlabored ventilation and respiratory function stable Cardiovascular status: blood pressure returned to baseline and stable Postop Assessment: no apparent nausea or vomiting Anesthetic complications: no   No complications documented.  Last Vitals:  Vitals:   11/09/20 1530 11/09/20 1630  BP: 139/61 (!) 137/50  Pulse: 63 (!) 59  Resp: 18 14  Temp: (!) 36.4 C (!) 36.4 C  SpO2: 98% 99%    Last Pain:  Vitals:   11/10/20 1043  PainSc: 0-No pain                 Yeng Perz,W. EDMOND

## 2020-11-11 ENCOUNTER — Encounter (HOSPITAL_BASED_OUTPATIENT_CLINIC_OR_DEPARTMENT_OTHER): Payer: Self-pay | Admitting: Obstetrics and Gynecology

## 2020-11-11 NOTE — Telephone Encounter (Signed)
Post- Op Call  Heather [name] underwent sling revision/ release and cystoscopy  on 11/09/20 with Dr Wannetta Sender. The patient reports that her pain is controlled. She is taking nothing for pain, she only took 2 tylenol on 5/4. She denies vaginal bleeding. She has had a bowel movement and is taking no for a bowel regimen. She was discharged without a catheter. Post op will be scheduled by Dr. Sanjuan Dame staff per Dr. Wannetta Sender for a 6w post op  Elita Quick, [place]

## 2020-11-20 ENCOUNTER — Other Ambulatory Visit: Payer: Self-pay | Admitting: Cardiovascular Disease

## 2020-11-23 ENCOUNTER — Encounter: Payer: Medicare HMO | Admitting: Obstetrics and Gynecology

## 2020-11-24 ENCOUNTER — Ambulatory Visit (INDEPENDENT_AMBULATORY_CARE_PROVIDER_SITE_OTHER): Payer: Medicare HMO | Admitting: Obstetrics & Gynecology

## 2020-11-24 ENCOUNTER — Emergency Department (HOSPITAL_BASED_OUTPATIENT_CLINIC_OR_DEPARTMENT_OTHER)
Admission: EM | Admit: 2020-11-24 | Discharge: 2020-11-24 | Disposition: A | Discharge status: Home / Self Care | Payer: Medicare HMO | Attending: Emergency Medicine | Admitting: Emergency Medicine

## 2020-11-24 ENCOUNTER — Telehealth: Payer: Self-pay | Admitting: Gastroenterology

## 2020-11-24 ENCOUNTER — Emergency Department (HOSPITAL_BASED_OUTPATIENT_CLINIC_OR_DEPARTMENT_OTHER): Payer: Medicare HMO

## 2020-11-24 ENCOUNTER — Encounter (HOSPITAL_BASED_OUTPATIENT_CLINIC_OR_DEPARTMENT_OTHER): Payer: Self-pay | Admitting: Obstetrics & Gynecology

## 2020-11-24 ENCOUNTER — Telehealth: Payer: Self-pay | Admitting: Obstetrics and Gynecology

## 2020-11-24 ENCOUNTER — Encounter (HOSPITAL_BASED_OUTPATIENT_CLINIC_OR_DEPARTMENT_OTHER): Payer: Self-pay | Admitting: Emergency Medicine

## 2020-11-24 ENCOUNTER — Other Ambulatory Visit: Payer: Self-pay

## 2020-11-24 ENCOUNTER — Telehealth: Payer: Self-pay | Admitting: *Deleted

## 2020-11-24 VITALS — BP 152/58 | HR 58 | Temp 98.3°F | Wt 160.0 lb

## 2020-11-24 DIAGNOSIS — Z79899 Other long term (current) drug therapy: Secondary | ICD-10-CM | POA: Insufficient documentation

## 2020-11-24 DIAGNOSIS — Z96651 Presence of right artificial knee joint: Secondary | ICD-10-CM | POA: Insufficient documentation

## 2020-11-24 DIAGNOSIS — R350 Frequency of micturition: Secondary | ICD-10-CM | POA: Insufficient documentation

## 2020-11-24 DIAGNOSIS — Z7982 Long term (current) use of aspirin: Secondary | ICD-10-CM | POA: Diagnosis not present

## 2020-11-24 DIAGNOSIS — R1031 Right lower quadrant pain: Secondary | ICD-10-CM | POA: Diagnosis not present

## 2020-11-24 DIAGNOSIS — R140 Abdominal distension (gaseous): Secondary | ICD-10-CM | POA: Diagnosis not present

## 2020-11-24 DIAGNOSIS — I10 Essential (primary) hypertension: Secondary | ICD-10-CM | POA: Insufficient documentation

## 2020-11-24 DIAGNOSIS — R112 Nausea with vomiting, unspecified: Secondary | ICD-10-CM | POA: Diagnosis not present

## 2020-11-24 DIAGNOSIS — E1169 Type 2 diabetes mellitus with other specified complication: Secondary | ICD-10-CM | POA: Diagnosis not present

## 2020-11-24 DIAGNOSIS — R1084 Generalized abdominal pain: Secondary | ICD-10-CM | POA: Insufficient documentation

## 2020-11-24 DIAGNOSIS — E785 Hyperlipidemia, unspecified: Secondary | ICD-10-CM | POA: Insufficient documentation

## 2020-11-24 DIAGNOSIS — K8080 Other cholelithiasis without obstruction: Secondary | ICD-10-CM | POA: Diagnosis not present

## 2020-11-24 DIAGNOSIS — Z8541 Personal history of malignant neoplasm of cervix uteri: Secondary | ICD-10-CM | POA: Diagnosis not present

## 2020-11-24 DIAGNOSIS — I2510 Atherosclerotic heart disease of native coronary artery without angina pectoris: Secondary | ICD-10-CM | POA: Insufficient documentation

## 2020-11-24 DIAGNOSIS — N393 Stress incontinence (female) (male): Secondary | ICD-10-CM | POA: Diagnosis not present

## 2020-11-24 DIAGNOSIS — Z8616 Personal history of COVID-19: Secondary | ICD-10-CM | POA: Insufficient documentation

## 2020-11-24 LAB — POCT URINALYSIS DIPSTICK
Appearance: NORMAL
Bilirubin, UA: NEGATIVE
Blood, UA: NEGATIVE
Glucose, UA: NEGATIVE
Ketones, UA: NEGATIVE
Nitrite, UA: NEGATIVE
Protein, UA: NEGATIVE
Spec Grav, UA: 1.01 (ref 1.010–1.025)
Urobilinogen, UA: 0.2 E.U./dL
pH, UA: 5.5 (ref 5.0–8.0)

## 2020-11-24 LAB — CBC
HCT: 39.9 % (ref 36.0–46.0)
Hemoglobin: 13.1 g/dL (ref 12.0–15.0)
MCH: 31 pg (ref 26.0–34.0)
MCHC: 32.8 g/dL (ref 30.0–36.0)
MCV: 94.5 fL (ref 80.0–100.0)
Platelets: 187 10*3/uL (ref 150–400)
RBC: 4.22 MIL/uL (ref 3.87–5.11)
RDW: 12.4 % (ref 11.5–15.5)
WBC: 7.9 10*3/uL (ref 4.0–10.5)
nRBC: 0 % (ref 0.0–0.2)

## 2020-11-24 LAB — COMPREHENSIVE METABOLIC PANEL
ALT: 17 U/L (ref 0–44)
AST: 16 U/L (ref 15–41)
Albumin: 4.3 g/dL (ref 3.5–5.0)
Alkaline Phosphatase: 75 U/L (ref 38–126)
Anion gap: 8 (ref 5–15)
BUN: 22 mg/dL (ref 8–23)
CO2: 26 mmol/L (ref 22–32)
Calcium: 9.6 mg/dL (ref 8.9–10.3)
Chloride: 103 mmol/L (ref 98–111)
Creatinine, Ser: 0.89 mg/dL (ref 0.44–1.00)
GFR, Estimated: >60 mL/min (ref >60)
Glucose, Bld: 175 mg/dL — ABNORMAL HIGH (ref 70–99)
Potassium: 3.9 mmol/L (ref 3.5–5.1)
Sodium: 137 mmol/L (ref 135–145)
Total Bilirubin: 0.5 mg/dL (ref 0.3–1.2)
Total Protein: 6.5 g/dL (ref 6.5–8.1)

## 2020-11-24 LAB — LIPASE, BLOOD: Lipase: 58 U/L — ABNORMAL HIGH (ref 11–51)

## 2020-11-24 MED ORDER — MORPHINE SULFATE (PF) 4 MG/ML IV SOLN
4.0000 mg | Freq: Once | INTRAVENOUS | Status: AC
  Administered 2020-11-24: 4 mg via INTRAVENOUS
  Filled 2020-11-24: qty 1

## 2020-11-24 MED ORDER — ONDANSETRON HCL 4 MG/2ML IJ SOLN
4.0000 mg | Freq: Once | INTRAMUSCULAR | Status: AC
  Administered 2020-11-24: 4 mg via INTRAVENOUS
  Filled 2020-11-24: qty 2

## 2020-11-24 NOTE — Telephone Encounter (Signed)
Patient called for an appointment and was advised to go to ED and she got a little mad and was transferred to me.    She stated that she had vomiting and now she is dry heaving, she also has severe abdominal pain with swelling. She has been taking promethazine 12.5mg  which has not helped.  She is worried about her hernia.   Advised that she go ER now!  Advised that there are scans and blood work that they can do now rather than waiting on an appointment her (as we do not have any openings today).  Patient agreed to go to ER (advised her about the [facility]ryn at [facility])

## 2020-11-24 NOTE — ED Notes (Signed)
Transported to CT 

## 2020-11-24 NOTE — ED Triage Notes (Signed)
Pt from pcp office with abdominal pain today. She had recent bladder/vaginal procedure but this is new and different pain. Pt states her pain is generalized but that she was tender on the right side of her abdomen and the PCP office wants to rule out appendicitis. Pt alert & oriented, nad noted.

## 2020-11-24 NOTE — Telephone Encounter (Signed)
Pt came in to office and was seen by Dr. [name] Heck

## 2020-11-24 NOTE — Discharge Instructions (Addendum)
1.  Take your omeprazole 40 mg daily as prescribed.  Take Pepcid at night as prescribed.  Take Zofran and Phenergan as needed for nausea or vomiting.  Take acetaminophen and tramadol as needed for pain. 2.  You should have a recheck with your doctor in the next 24 hours.  Return to the emergency department sooner for recheck if you are getting recurrence of significant pain and symptoms are not significantly improved with your medications. 3.  Follow the instructions for gallbladder diet.  Have many gallstones and a gallbladder attack can be very painful.  You must completely avoid fats until your symptoms are completely resolved.  You should have a recheck of your lipase and your labs within the next 24 to 48 hours.  Try to have a follow-up with Dr. Havery [name] or your family doctor as soon as possible.

## 2020-11-24 NOTE — Progress Notes (Signed)
GYNECOLOGY  VISIT  CC:   Nausea, dry heaves, abdominal pain and bloating  HPI: 86 y.o. [id number] Widowed White or Caucasian female here for complaint of acute onset of nausea and vomiting to the point of dry heaving that started this morning.  Reports this is new for her.  Has an umbilical hernia and she is concerned about this as she is having bloating as well.  Pt's recent medical history is significant for rectocele repair with sling placement that caused post op urinary retention.  Pt had to go back to the OR on 11/09/2020 for sling revision where sling was cut.  Pt reports she initially had a return of her urinary incontinence but this is gradually getting better each day.  Now really not leaking.  Does feel like she is emptying well.  No pain with voiding.  Did leak some today with episodes of emesis.   Denies vaginal bleeding.  Drainage is minimal.  No fever at home.  Did have a bowel movement this morning.  Accompanied by her son today.  Patient Active Problem List   Diagnosis Date Noted  . Low back pain radiating to right leg 07/14/2020  . 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 Prolapse of female pelvic organs 12/22/2019  . Pelvic relaxation 12/22/2019  . Right hip pain 10/13/2019  . Left foot pain 03/08/2019  . Pelvic prolapse 03/08/2019  . Pain of breast 01/29/2019  . Overactive bladder 01/29/2019  . Educated about COVID-19 virus infection 12/02/2018  . Hyperlipidemia associated with type 2 diabetes mellitus ([place]) 05/27/2017  . Right shoulder pain 04/29/2015  . Diarrhea 04/17/2015  . Headache 04/17/2015  . Multinodular goiter 01/17/2015  . Peroneal DVT (deep venous thrombosis) ([place]) 12/22/2014  . S/P total knee arthroplasty 11/29/2014  . Arthritis of knee, degenerative 11/19/2014  . Right knee pain 11/19/2014  . Neck pain 10/29/2014  . Insomnia 02/05/2014  . Preventative health care 11/22/2013  . RLS (restless legs syndrome) 10/04/2013  . Lower urinary tract  infectious disease 10/04/2013  . Cataracts, bilateral 04/02/2013  . Leg swelling 01/17/2012  . Hypokalemia 01/05/2012  . Anemia 01/05/2012  . Depression with anxiety 01/05/2012  . Postoperative anemia due to acute blood loss 11/15/2011  . 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 Pre-syncope 10/13/2011  . DM (diabetes mellitus) ([place]) 10/06/2011  . Pulmonary nodule 10/06/2011  . Epigastric pain 10/06/2011  . DJD (degenerative joint disease) of knee 09/18/2011  . It band syndrome, right 08/28/2011  . Renal insufficiency 08/28/2011  . CORONARY ATHEROSCLEROSIS NATIVE CORONARY ARTERY 03/07/2010  . Coronary atherosclerosis 01/18/2009  . FATIGUE 01/18/2009  . PERSISTENT DISORDER INITIATING/MAINTAINING SLEEP 09/09/2008  . DERMATITIS 10/16/2007  . PERIPHERAL EDEMA 10/16/2007  . PALPITATIONS 10/16/2007  . Essential hypertension 04/14/2007  . GERD 04/14/2007  . TRANSIENT ISCHEMIC ATTACK, HX OF 04/14/2007    Past Medical History:  Diagnosis Date  . Chronic stable angina ([place])   . Coronary artery disease cardiologist--- dr Burt Knack   hx NSTEMI  w/ cardiac cath 03-16-2005 PCI with DES to LCx;   cardiac cath 06-27-2005  PCI with DES to RCA with residual dx LAD manage medcially;  lexiscan 01-26-20211 normal no ischemia, ef 70%;  stress echo w/ dobutamine 10-24-2011 negative ishcmeia , normal ef // Myoview 2/22: EF 67, no ischemia or infarction, no TID, low risk   . Diabetes mellitus type 2, diet-controlled ([place])    followed by pcp  (10-19-2020  pt stated checks daily in am,  fasting blood surgar--- 115--120s)  .  DOE (dyspnea on exertion)    per pt "when I over do",  ok with household chores  . Echocardiogram 08/2020    Echocardiogram 2/22: EF 55-60, no RWMA, mild LVH, Gr 2 DD, GLS-21.7%, normal RVSF, trivial MR, RVSP 39.5  . Edema of right lower extremity   . GERD (gastroesophageal reflux disease)   . Hiatal hernia    recurrence,  hx HH repair 1989  . History of cervical cancer    s/p   vaginal hysterectomy  . History of DVT of lower extremity 2016   11-29-2014 post op right TKA of right lower extremity and completed xarelto   . History of esophageal stricture    hx s/p dilatation's  . History of gastric ulcer 2005 approx.  Marland Kitchen History of palpitations 2010   event monitor 07-07-2009 showed NSR w/ freq. SVT ectopies with short runs, rare PVCs  . History of TIA (transient ischemic attack) 06/1999   12-15-2019  per pt had several TIA between 12/ 2000 to 02/ 2001 , was sent to specialist _0 , had test that was normal (10-19-2020 pt stated no TIAs since ) but has residual of essential tremors of right arm/ hand  . Hypertension   . Intermittent palpitations   . IT band syndrome   . Migraine    "ice pick headche lasts about 30 seconds"  . Mixed hyperlipidemia   . Mixed incontinence urge and stress   . Multiple thyroid nodules    followed by pcp---   ultrasound 11-22-2014 no bx   (12-15-2019 per pt had a endocrinologist and was told did not need bx)  . OA (osteoarthritis)    knees, elbow, hip, ankles  . Occasional tremors    right arm/ hand  s/p TIA residual 2000  . Osteoporosis    taking vitamin d  . Right bundle branch block (RBBB) with left anterior fascicular block (LAFB)   . RLS (restless legs syndrome)   . S/P drug eluting coronary stent placement 2006   03-16-2005  PCI x1 DES to LCx;   06-27-2005  PCI x1 DES to RCA  . Urinary retention    post op sling prodecure on 10-27-2020, has foley cathether    Past Surgical History:  Procedure Laterality Date  . ANTERIOR AND POSTERIOR REPAIR N/A 12/22/2019   Procedure: ANTERIOR (CYSTOCELE)  REPAIR;  Surgeon: Janyth Contes, MD;  Location: [facility];  Service: Gynecology;  Laterality: N/A;  . ANTERIOR AND POSTERIOR REPAIR WITH SACROSPINOUS FIXATION N/A 10/27/2020   Procedure: SACROSPINOUS LIGAMENT FIXATION;  Surgeon: Jaquita Folds, MD;  [facility];  Service:  Gynecology;  Laterality: N/A;  . BLADDER SUSPENSION N/A 10/27/2020   Procedure: TRANSVAGINAL TAPE (TVT) PROCEDURE;  Surgeon: Jaquita Folds, MD;  Location: [facility];  Service: Gynecology;  Laterality: N/A;  . CATARACT EXTRACTION W/ INTRAOCULAR LENS  IMPLANT, BILATERAL  2015  . COLONOSCOPY  last one ?  . CORONARY ANGIOPLASTY WITH STENT PLACEMENT  03-16-2005   dr Lia Foyer   PCI and DES x1 to LCx  . CORONARY ANGIOPLASTY WITH STENT PLACEMENT  06-27-2005  dr Lia Foyer   PCI and DES x1 to RCA with residual disease LAD 70-80% to manage medically  . CYSTOSCOPY N/A 10/27/2020   Procedure: CYSTOSCOPY;  Surgeon: Jaquita Folds, MD;  Location: [facility];  Service: Gynecology;  Laterality: N/A;  . CYSTOSCOPY N/A 11/09/2020   Procedure: CYSTOSCOPY;  Surgeon: Jaquita Folds, MD;  Location: [facility];  Service: Gynecology;  Laterality: N/A;  . FOOT SURGERY Left 1990s   left foot stress fracture repair, per pt no hardware  . HIATAL HERNIA REPAIR  1989  . KNEE ARTHROSCOPY Bilateral right ?/   left x2 , last one 09-12-2009 @ [place]  . PUBOVAGINAL SLING N/A 11/09/2020   Procedure: REVISION OF PUBO-VAGINAL SLING;  Surgeon: Jaquita Folds, MD;  Location: [facility];  Service: Gynecology;  Laterality: N/A;  . RECTOCELE REPAIR N/A 04/20/2020   Procedure: POSTERIOR REPAIR (RECTOCELE);  Surgeon: Janyth Contes, MD;  Location: [facility];  Service: Gynecology;  Laterality: N/A;  . RECTOCELE REPAIR N/A 10/27/2020   Procedure: POSTERIOR REPAIR (RECTOCELE);  Surgeon: Jaquita Folds, MD;  Location: [facility];  Service: Gynecology;  Laterality: N/A;  total time requested for all procedures is 2 hours  . TOTAL KNEE ARTHROPLASTY  11/12/2011   Procedure: TOTAL KNEE ARTHROPLASTY;  Surgeon: Lorn Junes, MD;  Location: [facility];  Service: Orthopedics;  Laterality: Left;  Dr Noemi Chapel wants 90 minutes for  this case  . TOTAL KNEE ARTHROPLASTY Right 11/29/2014   Procedure: RIGHT TOTAL KNEE ARTHROPLASTY;  Surgeon: Vickey Huger, MD;  Location: [facility];  Service: Orthopedics;  Laterality: Right;  . UPPER GASTROINTESTINAL ENDOSCOPY  last one 04-25-2017   with dilatation esophageal stricture and savary dilatation  . VAGINAL HYSTERECTOMY  1988    no ovaries removed for bleeeding    MEDS:   Current Outpatient Medications on File Prior to Visit  Medication Sig Dispense Refill  . acetaminophen (TYLENOL) 500 MG tablet Take 1,000 mg by mouth every 6 (six) hours as needed.    Marland Kitchen amLODipine (NORVASC) 10 MG tablet Take 1 tablet (10 mg total) by mouth daily. (Patient taking differently: Take 10 mg by mouth daily.) 90 tablet 1  . Ascorbic Acid (VITAMIN C) 500 MG CHEW Chew by mouth daily.    Marland Kitchen aspirin EC 81 MG tablet Take 1 tablet (81 mg total) by mouth daily.    . Blood Glucose Monitoring Suppl (TRUE METRIX AIR GLUCOSE METER) w/Device KIT USE TO CHECK BLOOD SUGAR ONCE DAILY AND AS NEEDED.  DX CODE E11.9 1 kit 0  . Cholecalciferol (VITAMIN D) 50 MCG (2000 UT) CAPS Take 2,000 Units by mouth daily.     . famotidine (PEPCID) 20 MG tablet Take 20 mg by mouth at bedtime as needed for heartburn or indigestion.    . gabapentin (NEURONTIN) 100 MG capsule Take 1 capsule (100 mg total) by mouth 3 (three) times daily. 90 capsule 3  . glucose blood (TRUE METRIX BLOOD GLUCOSE TEST) test strip USE TO CHECK BLOOD SUGAR ONCE A DAY OR AS NEEDED.  DX CODE: E11.9 200 each 1  . hyoscyamine (LEVSIN SL) 0.125 MG SL tablet Place 1 tablet (0.125 mg total) under the tongue every 4 (four) hours as needed. 30 tablet 0  . isosorbide mononitrate (IMDUR) 60 MG 24 hr tablet Take 1 tablet (60 mg total) by mouth daily. 90 tablet 3  . ketorolac (TORADOL) 10 MG tablet Take 1 tablet (10 mg total) by mouth every 6 (six) hours as needed. 20 tablet 0  . losartan (COZAAR) 100 MG tablet Take 1 tablet (100 mg total) by mouth daily. 90 tablet 0  . magnesium  oxide (MAG-OX) 400 MG tablet Take 400 mg by mouth 2 (two) times daily.    . metoprolol succinate (TOPROL-XL) 50 MG 24 hr tablet Take 1.5 tablets (75 mg total) by mouth at bedtime.  Take with or immediately following a meal. (Patient taking differently: Take 75 mg by mouth at bedtime. Take with or immediately following a meal.) 135 tablet 3  . Multiple Vitamins-Minerals (ZINC PO) Take by mouth daily.    . nitroGLYCERIN (NITROSTAT) 0.4 MG SL tablet Place 1 tablet (0.4 mg total) under the tongue every 5 (five) minutes as needed for chest pain. 25 tablet 11  . omeprazole (PRILOSEC) 20 MG capsule Take 1 capsule (20 mg total) by mouth daily as needed. (Patient taking differently: Take 20 mg by mouth daily.) 90 capsule 3  . ondansetron (ZOFRAN ODT) 4 MG disintegrating tablet Take 1 tablet (4 mg total) by mouth every 6 (six) hours as needed for nausea or vomiting. 30 tablet 0  . polyethylene glycol powder (GAVILAX) 17 GM/SCOOP powder Take 17 g by mouth daily. (Patient taking differently: Take 17 g by mouth as directed. As directed by dr schroeder, pre-op for surgery on 10-27-2020) 255 g 0  . potassium chloride SA (KLOR-CON) 20 MEQ tablet Take 0.5 tablets (10 mEq total) by mouth daily. 45 tablet 2  . pramipexole (MIRAPEX) 0.25 MG tablet Take 1 tablet (0.25 mg total) by mouth 2 (two) times daily. (Patient taking differently: Take 0.25 mg by mouth 2 (two) times daily.) 180 tablet 1  . promethazine (PHENERGAN) 12.5 MG tablet Take 1 tablet (12.5 mg total) by mouth every 8 (eight) hours as needed for nausea or vomiting. 30 tablet 0  . simvastatin (ZOCOR) 20 MG tablet TAKE 1 TABLET AT BEDTIME (Patient taking differently: Take by mouth at bedtime.) 90 tablet 2  . spironolactone (ALDACTONE) 25 MG tablet Take 0.5 tablets (12.5 mg total) by mouth 3 (three) times a week. (Patient taking differently: Take 12.5 mg by mouth 3 (three) times a week. Monday /  Wednesday/ Friday) 90 tablet 3  . TRUEplus Lancets 33G MISC USE TO  CHECK BLOOD SUGAR ONCE DAILY AND AS NEEDED.  DX CODE: E11.9 200 each 1   No current facility-administered medications on file prior to visit.    ALLERGIES: Baclofen and Nitrofurantoin  Family History  Problem Relation Age of Onset  . Stroke Father        family hx of M 1st degree relative <50  . Coronary artery disease Mother   . Heart disease Mother   . Depression Brother   . Stroke Brother   . Diabetes Brother   . Cancer Brother        bladder with mets  . Diabetes Daughter        borderline  . Hypertension Daughter   . Arthritis Other        family hx of  . Hypertension Other        family hx of  . Other Other        family hx of cardiovascular disorder  . Thyroid disease Daughter   . Breast cancer Neg Hx   . Colon cancer Neg Hx   . Anesthesia problems Neg Hx   . Hypotension Neg Hx   . Malignant hyperthermia Neg Hx   . Pseudochol deficiency Neg Hx   . Colon polyps Neg Hx   . Esophageal cancer Neg Hx   . Rectal cancer Neg Hx   . Stomach cancer Neg Hx     SH:  Widowed, non smoker  Review of Systems  Constitutional: Negative for chills and diaphoresis.  Respiratory: Negative.   Cardiovascular: Negative.   Gastrointestinal: Positive for abdominal distention and abdominal pain.  Genitourinary: Negative  for flank pain, genital sores, pelvic pain and urgency.  Hematological: Bruises/bleeds easily.  Psychiatric/Behavioral: Negative for confusion.    PHYSICAL EXAMINATION:    BP (!) 152/58   Pulse (!) 58   Temp 98.3 F (36.8 C)   Wt 160 lb (72.6 kg)   LMP  (LMP Unknown)   BMI 28.34 kg/m     General appearance: alert, cooperative and appears stated age CV:  Regular rate and rhythm Lungs:  clear to auscultation, no wheezes, rales or rhonchi, symmetric air entry Abdomen: distended with RLQ pain to palpation, umbilical hernia is soft, quiet bowel sounds except for LUQ where occasional BS was heard.  Guarding present, no rebound present. Lymph:  no inguinal LAD  noted  Pelvic: External genitalia:  no lesions              Urethra:  normal appearing urethra with no masses, tenderness or lesions              Bartholins and Skenes: normal                 Vagina: normal appearing vagina with normal color and discharge, no lesions, sutures intact, no mass or fullness, minimal discharge, no bleeding              Cervix: absent              Bimanual Exam:  Uterus:  uterus absent               Catheterization performed under sterile conditions for 150cc. Pt did wait about 20 minutes for me after voiding.    POCT urinalysis dipstick is negative.  Chaperone, Ezekiel Ina, RN, was present for exam.  Assessment/Plan: 1. RLQ abdominal pain - with location and associated symptoms, am concerned about appendicitis.  Called ER and there is no wait so will send there for additional evaluation  2. Non-intractable vomiting with nausea, unspecified vomiting type  3. Abdominal distension  4. Recent revision of mid urethral sling - exam is normal so do not feel this is related at this time.  Also cath did not show significant volume of urine so less concerned about pretension.    5. Stress incontinence of urine - POCT urinalysis dipstick

## 2020-11-24 NOTE — Telephone Encounter (Signed)
Patient called states she having a lot of abdominal pain and vomiting seeking advise.

## 2020-11-24 NOTE — Telephone Encounter (Signed)
Pt in ED.  

## 2020-11-24 NOTE — ED Provider Notes (Signed)
[facility] EMERGENCY DEPT Provider Note   CSN: [id number] Arrival date & time: 11/24/20  1214     History Chief Complaint  Patient presents with  . Abdominal Pain    Natalia SHERRITA RIEDERER is a 86 y.o. female.  HPI Patient is sent to the emergency department from a visit with Dr. [name] Heck gynecology at this facility.  Patient had called and gone to the facility due to sudden onset of nausea and vomiting with dry heaves.  She reports symptoms started this morning she then had a band of pain around her central abdomen.  Patient reports she was feeling bloated no associated diarrhea.  Patient had had a recent rectocele repair with a sling placement that cause postoperative urinary retention.  She was taken back to the OR on 5\4\2022 with a sling was removed.  Patient reports she does have some urinary frequency because of this.  She is not having dysuria.  While seen upstairs this morning, a pelvic exam and catheter were done to assess for urinary retention.  Patient did not have urinary retention.  She had 150 cc in the bladder.  The pelvic exam was normal not showing concerns for acute complications of her recent sling removal or rectocele.  She is sent emergency department for further evaluation for CT scan and labs.    Past Medical History:  Diagnosis Date  . Chronic stable angina ([place])   . Coronary artery disease cardiologist--- dr Burt Knack   hx NSTEMI  w/ cardiac cath 03-16-2005 PCI with DES to LCx;   cardiac cath 06-27-2005  PCI with DES to RCA with residual dx LAD manage medcially;  lexiscan 01-26-20211 normal no ischemia, ef 70%;  stress echo w/ dobutamine 10-24-2011 negative ishcmeia , normal ef // Myoview 2/22: EF 67, no ischemia or infarction, no TID, low risk   . Diabetes mellitus type 2, diet-controlled ([place])    followed by pcp  (10-19-2020  pt stated checks daily in am,  fasting blood surgar--- 115--120s)  . DOE (dyspnea on exertion)    per pt "when I over do",  ok with  household chores  . Echocardiogram 08/2020    Echocardiogram 2/22: EF 55-60, no RWMA, mild LVH, Gr 2 DD, GLS-21.7%, normal RVSF, trivial MR, RVSP 39.5  . Edema of right lower extremity   . GERD (gastroesophageal reflux disease)   . Hiatal hernia    recurrence,  hx HH repair 1989  . History of cervical cancer    s/p  vaginal hysterectomy  . History of DVT of lower extremity 2016   11-29-2014 post op right TKA of right lower extremity and completed xarelto   . History of esophageal stricture    hx s/p dilatation's  . History of gastric ulcer 2005 approx.  Marland Kitchen History of palpitations 2010   event monitor 07-07-2009 showed NSR w/ freq. SVT ectopies with short runs, rare PVCs  . History of TIA (transient ischemic attack) 06/1999   12-15-2019  per pt had several TIA between 12/ 2000 to 02/ 2001 , was sent to specialist '@[facility]' , had test that was normal (10-19-2020 pt stated no TIAs since ) but has residual of essential tremors of right arm/ hand  . Hypertension   . Intermittent palpitations   . IT band syndrome   . Migraine    "ice pick headche lasts about 30 seconds"  . Mixed hyperlipidemia   . Mixed incontinence urge and stress   . Multiple thyroid nodules    followed by pcp---  ultrasound 11-22-2014 no bx   (12-15-2019 per pt had a endocrinologist and was told did not need bx)  . OA (osteoarthritis)    knees, elbow, hip, ankles  . Occasional tremors    right arm/ hand  s/p TIA residual 2000  . Osteoporosis    taking vitamin d  . Right bundle branch block (RBBB) with left anterior fascicular block (LAFB)   . RLS (restless legs syndrome)   . S/P drug eluting coronary stent placement 2006   03-16-2005  PCI x1 DES to LCx;   06-27-2005  PCI x1 DES to RCA  . Urinary retention    post op sling prodecure on 10-27-2020, has foley cathether    Patient Active Problem List   Diagnosis Date Noted  . Low back pain radiating to right leg 07/14/2020  . Pelvic relaxation due to rectocele  04/06/2020  . Prolapse of female pelvic organs 12/22/2019  . Pelvic relaxation 12/22/2019  . Right hip pain 10/13/2019  . Left foot pain 03/08/2019  . Pelvic prolapse 03/08/2019  . Pain of breast 01/29/2019  . Overactive bladder 01/29/2019  . Educated about COVID-19 virus infection 12/02/2018  . Hyperlipidemia associated with type 2 diabetes mellitus ([place]) 05/27/2017  . Right shoulder pain 04/29/2015  . Diarrhea 04/17/2015  . Headache 04/17/2015  . Multinodular goiter 01/17/2015  . Peroneal DVT (deep venous thrombosis) ([place]) 12/22/2014  . S/P total knee arthroplasty 11/29/2014  . Arthritis of knee, degenerative 11/19/2014  . Right knee pain 11/19/2014  . Neck pain 10/29/2014  . Insomnia 02/05/2014  . Preventative health care 11/22/2013  . RLS (restless legs syndrome) 10/04/2013  . Lower urinary tract infectious disease 10/04/2013  . Cataracts, bilateral 04/02/2013  . Leg swelling 01/17/2012  . Hypokalemia 01/05/2012  . Anemia 01/05/2012  . Depression with anxiety 01/05/2012  . Postoperative anemia due to acute blood loss 11/15/2011  . 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 Pre-syncope 10/13/2011  . DM (diabetes mellitus) ([place]) 10/06/2011  . Pulmonary nodule 10/06/2011  . Epigastric pain 10/06/2011  . DJD (degenerative joint disease) of knee 09/18/2011  . It band syndrome, right 08/28/2011  . Renal insufficiency 08/28/2011  . CORONARY ATHEROSCLEROSIS NATIVE CORONARY ARTERY 03/07/2010  . Coronary atherosclerosis 01/18/2009  . FATIGUE 01/18/2009  . PERSISTENT DISORDER INITIATING/MAINTAINING SLEEP 09/09/2008  . DERMATITIS 10/16/2007  . PERIPHERAL EDEMA 10/16/2007  . PALPITATIONS 10/16/2007  . Essential hypertension 04/14/2007  . GERD 04/14/2007  . TRANSIENT ISCHEMIC ATTACK, HX OF 04/14/2007    Past Surgical History:  Procedure Laterality Date  . ANTERIOR AND POSTERIOR REPAIR N/A 12/22/2019   Procedure: ANTERIOR (CYSTOCELE)  REPAIR;  Surgeon: Janyth Contes,  MD;  Location: [facility];  Service: Gynecology;  Laterality: N/A;  . ANTERIOR AND POSTERIOR REPAIR WITH SACROSPINOUS FIXATION N/A 10/27/2020   Procedure: SACROSPINOUS LIGAMENT FIXATION;  Surgeon: Jaquita Folds, MD;  Location: [facility];  Service: Gynecology;  Laterality: N/A;  . BLADDER SUSPENSION N/A 10/27/2020   Procedure: TRANSVAGINAL TAPE (TVT) PROCEDURE;  Surgeon: Jaquita Folds, MD;  Location: [facility];  Service: Gynecology;  Laterality: N/A;  . CATARACT EXTRACTION W/ INTRAOCULAR LENS  IMPLANT, BILATERAL  2015  . COLONOSCOPY  last one ?  . CORONARY ANGIOPLASTY WITH STENT PLACEMENT  03-16-2005   dr Lia Foyer   PCI and DES x1 to LCx  . CORONARY ANGIOPLASTY WITH STENT PLACEMENT  06-27-2005  dr Lia Foyer   PCI and DES x1 to RCA with residual disease LAD 70-80% to manage medically  .  CYSTOSCOPY N/A 10/27/2020   Procedure: CYSTOSCOPY;  Surgeon: Jaquita Folds, MD;  Location: [facility];  Service: Gynecology;  Laterality: N/A;  . CYSTOSCOPY N/A 11/09/2020   Procedure: CYSTOSCOPY;  Surgeon: Jaquita Folds, MD;  Location: [facility];  Service: Gynecology;  Laterality: N/A;  . FOOT SURGERY Left 1990s   left foot stress fracture repair, per pt no hardware  . HIATAL HERNIA REPAIR  1989  . KNEE ARTHROSCOPY Bilateral right ?/   left x2 , last one 09-12-2009 @ [facility]  . PUBOVAGINAL SLING N/A 11/09/2020   Procedure: REVISION OF PUBO-VAGINAL SLING;  Surgeon: Jaquita Folds, MD;  Location: [facility];  Service: Gynecology;  Laterality: N/A;  . RECTOCELE REPAIR N/A 04/20/2020   Procedure: POSTERIOR REPAIR (RECTOCELE);  Surgeon: Janyth Contes, MD;  [facility];  Service: Gynecology;  Laterality: N/A;  . RECTOCELE REPAIR N/A 10/27/2020   Procedure: POSTERIOR REPAIR (RECTOCELE);  Surgeon: Jaquita Folds, MD;  Location: [facility];   Service: Gynecology;  Laterality: N/A;  total time requested for all procedures is 2 hours  . TOTAL KNEE ARTHROPLASTY  11/12/2011   Procedure: TOTAL KNEE ARTHROPLASTY;  Surgeon: Lorn Junes, MD;  Location: [facility];  Service: Orthopedics;  Laterality: Left;  Dr Noemi Chapel wants 90 minutes for this case  . TOTAL KNEE ARTHROPLASTY Right 11/29/2014   Procedure: RIGHT TOTAL KNEE ARTHROPLASTY;  Surgeon: Vickey Huger, MD;  Location: [facility];  Service: Orthopedics;  Laterality: Right;  . UPPER GASTROINTESTINAL ENDOSCOPY  last one 04-25-2017   with dilatation esophageal stricture and savary dilatation  . VAGINAL HYSTERECTOMY  1988    no ovaries removed for bleeeding     OB History    Gravida  4   Para  1   Term      Preterm  1   AB      Living  4     SAB      IAB      Ectopic      Multiple      Live Births              Family History  Problem Relation Age of Onset  . Stroke Father        family hx of M 1st degree relative <50  . Coronary artery disease Mother   . Heart disease Mother   . Depression Brother   . Stroke Brother   . Diabetes Brother   . Cancer Brother        bladder with mets  . Diabetes Daughter        borderline  . Hypertension Daughter   . Arthritis Other        family hx of  . Hypertension Other        family hx of  . Other Other        family hx of cardiovascular disorder  . Thyroid disease Daughter   . Breast cancer Neg Hx   . Colon cancer Neg Hx   . Anesthesia problems Neg Hx   . Hypotension Neg Hx   . Malignant hyperthermia Neg Hx   . Pseudochol deficiency Neg Hx   . Colon polyps Neg Hx   . Esophageal cancer Neg Hx   . Rectal cancer Neg Hx   . Stomach cancer Neg Hx     Social History   Tobacco Use  . Smoking status: Never Smoker  . Smokeless tobacco: Never  Used  Vaping Use  . Vaping Use: Never used  Substance Use Topics  . Alcohol use: No    Alcohol/week: 0.0 standard drinks  . Drug use: Never    Home Medications Prior to  Admission medications   Medication Sig Start Date End Date Taking? Authorizing Provider  acetaminophen (TYLENOL) 500 MG tablet Take 1,000 mg by mouth every 6 (six) hours as needed.    [provider]  amLODipine (NORVASC) 10 MG tablet Take 1 tablet (10 mg total) by mouth daily. Patient taking differently: Take 10 mg by mouth daily. 09/06/20   Mosie Lukes, MD  Ascorbic Acid (VITAMIN C) 500 MG CHEW Chew by mouth daily.    [provider]  aspirin EC 81 MG tablet Take 1 tablet (81 mg total) by mouth daily. 05/04/15   [name], MD  Blood Glucose Monitoring Suppl (TRUE METRIX AIR GLUCOSE METER) w/Device KIT USE TO CHECK BLOOD SUGAR ONCE DAILY AND AS NEEDED.  DX CODE E11.9 07/27/20   Mosie Lukes, MD  Cholecalciferol (VITAMIN D) 50 MCG (2000 UT) CAPS Take 2,000 Units by mouth daily.     [provider]  famotidine (PEPCID) 20 MG tablet Take 20 mg by mouth at bedtime as needed for heartburn or indigestion.    [provider]  gabapentin (NEURONTIN) 100 MG capsule Take 1 capsule (100 mg total) by mouth 3 (three) times daily. 10/30/20   Jaquita Folds, MD  glucose blood (TRUE METRIX BLOOD GLUCOSE TEST) test strip USE TO CHECK BLOOD SUGAR ONCE A DAY OR AS NEEDED.  DX CODE: E11.9 07/27/20   Mosie Lukes, MD  hyoscyamine (LEVSIN SL) 0.125 MG SL tablet Place 1 tablet (0.125 mg total) under the tongue every 4 (four) hours as needed. 03/06/19   Mosie Lukes, MD  isosorbide mononitrate (IMDUR) 60 MG 24 hr tablet Take 1 tablet (60 mg total) by mouth daily. 08/19/20   [name] T, PA-C  ketorolac (TORADOL) 10 MG tablet Take 1 tablet (10 mg total) by mouth every 6 (six) hours as needed. 10/30/20   Jaquita Folds, MD  losartan (COZAAR) 100 MG tablet Take 1 tablet (100 mg total) by mouth daily. 10/31/20   Mosie Lukes, MD  magnesium oxide (MAG-OX) 400 MG tablet Take 400 mg by mouth 2 (two) times daily.    [provider]  metoprolol succinate  (TOPROL-XL) 50 MG 24 hr tablet Take 1.5 tablets (75 mg total) by mouth at bedtime. Take with or immediately following a meal. Patient taking differently: Take 75 mg by mouth at bedtime. Take with or immediately following a meal. 08/29/20 08/29/21  [name] T, PA-C  Multiple Vitamins-Minerals (ZINC PO) Take by mouth daily.    [provider]  nitroGLYCERIN (NITROSTAT) 0.4 MG SL tablet Place 1 tablet (0.4 mg total) under the tongue every 5 (five) minutes as needed for chest pain. 08/29/20 08/29/21  [name] T, PA-C  omeprazole (PRILOSEC) 20 MG capsule Take 1 capsule (20 mg total) by mouth daily as needed. Patient taking differently: Take 20 mg by mouth daily. 03/01/20   Mosie Lukes, MD  ondansetron (ZOFRAN ODT) 4 MG disintegrating tablet Take 1 tablet (4 mg total) by mouth every 6 (six) hours as needed for nausea or vomiting. 12/15/19   Armbruster, Carlota Raspberry, MD  polyethylene glycol powder (GAVILAX) 17 GM/SCOOP powder Take 17 g by mouth daily. Patient taking differently: Take 17 g by mouth as directed. As directed by dr  schroeder, pre-op for surgery on 10-27-2020 10/19/20   Jaquita Folds, MD  potassium chloride SA (KLOR-CON) 20 MEQ tablet Take 0.5 tablets (10 mEq total) by mouth daily. 09/14/20   [name] T, PA-C  pramipexole (MIRAPEX) 0.25 MG tablet Take 1 tablet (0.25 mg total) by mouth 2 (two) times daily. Patient taking differently: Take 0.25 mg by mouth 2 (two) times daily. 09/26/20   Mosie Lukes, MD  promethazine (PHENERGAN) 12.5 MG tablet Take 1 tablet (12.5 mg total) by mouth every 8 (eight) hours as needed for nausea or vomiting. 10/30/20   Jaquita Folds, MD  simvastatin (ZOCOR) 20 MG tablet TAKE 1 TABLET AT BEDTIME Patient taking differently: Take by mouth at bedtime. 09/01/20   [name] T, PA-C  spironolactone (ALDACTONE) 25 MG tablet Take 0.5 tablets (12.5 mg total) by mouth 3 (three) times a week. Patient taking differently: Take 12.5 mg by mouth 3  (three) times a week. Monday /  Marlana Latus Friday 09/30/20   [name] T, PA-C  TRUEplus Lancets 33G MISC USE TO CHECK BLOOD SUGAR ONCE DAILY AND AS NEEDED.  DX CODE: E11.9 07/27/20   Mosie Lukes, MD    Allergies    Baclofen and Nitrofurantoin  Review of Systems   Review of Systems 10 systems reviewed and negative except as per HPI Physical Exam Updated Vital Signs BP (!) 138/56   Pulse (!) 48   Temp 98.7 F (37.1 C) (Oral)   Resp 18   Ht '5\' 2"'  (1.575 m)   Wt 72.6 kg   LMP  (LMP Unknown)   SpO2 94%   BMI 29.26 kg/m   Physical Exam Constitutional:      Comments: Patient is alert and nontoxic.  No respiratory distress.  Moderate discomfort.  Clear mental status  HENT:     Mouth/Throat:     Mouth: Mucous membranes are moist.     Pharynx: Oropharynx is clear.  Eyes:     Extraocular Movements: Extraocular movements intact.  Cardiovascular:     Rate and Rhythm: Normal rate and regular rhythm.  Pulmonary:     Effort: Pulmonary effort is normal.     Breath sounds: Normal breath sounds.  Abdominal:     Comments: Abdomen is soft.  No guarding.  No significantly reproducible pain.  Patient has a soft fat containing umbilical hernia.  Musculoskeletal:        General: No swelling or tenderness. Normal range of motion.  Skin:    General: Skin is warm and dry.  Neurological:     General: No focal deficit present.     Mental Status: She is oriented to person, place, and time.     Coordination: Coordination normal.  Psychiatric:        Mood and Affect: Mood normal.     ED Results / Procedures / Treatments   Labs (all labs ordered are listed, but only abnormal results are displayed) Labs Reviewed  LIPASE, BLOOD - Abnormal; Notable for the following components:      Result Value   Lipase 58 (*)    All other components within normal limits  COMPREHENSIVE METABOLIC PANEL - Abnormal; Notable for the following components:   Glucose, Bld 175 (*)    All other components  within normal limits  CBC  URINALYSIS, ROUTINE W REFLEX MICROSCOPIC    EKG None  Radiology CT Abdomen Pelvis Wo Contrast  Result Date: 11/24/2020 CLINICAL DATA:  Diffuse abdominal pain. EXAM: CT ABDOMEN AND PELVIS WITHOUT  CONTRAST TECHNIQUE: Multidetector CT imaging of the abdomen and pelvis was performed following the standard protocol without IV contrast. COMPARISON:  CT abdomen pelvis dated October 22, 2016. FINDINGS: Lower chest: No acute abnormality. Calcified granuloma in the posterior right middle lobe. Hepatobiliary: No focal liver abnormality. Multiple gallstones again noted. No gallbladder wall thickening or biliary dilatation. Pancreas: Unremarkable. No pancreatic ductal dilatation or surrounding inflammatory changes. Spleen: Normal in size without focal abnormality. Adrenals/Urinary Tract: The adrenal glands are unremarkable. Unchanged mild bilateral renal cortical scarring and small right renal simple cysts. Partially duplicated right renal collecting system again noted. No renal calculi or hydronephrosis. Tiny focus of air in the anterior bladder, presumably due to recent instrumentation. The bladder is otherwise unremarkable. Stomach/Bowel: Unchanged moderate hiatal hernia. The stomach is otherwise within normal limits. No bowel wall thickening, distention, or surrounding inflammatory changes. Moderate left-sided colonic diverticulosis again noted. Normal appendix. Vascular/Lymphatic: Aortic atherosclerosis. No enlarged abdominal or pelvic lymph nodes. Reproductive: Status post hysterectomy. No adnexal masses. Other: No free fluid or pneumoperitoneum. Small fat containing supraumbilical and paraumbilical hernias are unchanged. Small fat containing left inguinal hernia again noted with new stranding and trace fluid (series 2, image 72). Musculoskeletal: No acute or significant osseous findings. IMPRESSION: 1. No acute intra-abdominal process. 2. Cholelithiasis. 3. Aortic Atherosclerosis  (ICD10-I70.0). Electronically Signed   By: Titus Dubin M.D.   On: 11/24/2020 14:48    Procedures Procedures   Medications Ordered in ED Medications  morphine 4 MG/ML injection 4 mg (4 mg Intravenous Given 11/24/20 1351)  ondansetron (ZOFRAN) injection 4 mg (4 mg Intravenous Given 11/24/20 1423)    ED Course  I have reviewed the triage vital signs and the nursing notes.  Pertinent labs & imaging results that were available during my care of the patient were reviewed by me and considered in my medical decision making (see chart for details).    MDM Rules/Calculators/A&P                          Patient evaluated by CT scan.  No acute findings.  No changes in chronic underlying hernia findings and extensive gallstones in the gallbladder without cholecystitis.  No concerning findings around the pancreas.  Patient is lipase is marginally elevated.  This is been consistent with prior labs.  At this time I have lower suspicion for acute pancreatitis. LFTs are normal making choledocholithiasis unlikely.  Patient has gotten significant pain relief with 1 dose of morphine.  She is now comfortable in appearance.  She had GYN pelvic and GU examination done by Dr. [name] Heck in the GYN clinic urinary retention, complications of her recent pelvic sling removal are ruled out.  At this time, consideration is given to possible viral enteritis.  Patient describes symptoms as starting with fairly aggressive onset of vomiting.  Recommendation will be for patient to take antiemetics at home.  She has not had any active vomiting in the emergency department. patient has both Phenergan and Zofran to use at home.  He also has tramadol for pain.  She takes omeprazole 40 mg daily and may add Pepcid as needed.  Plan will be for recheck within the next 24 hours to monitor lipase and LFTs as well as response to pain management.  Reinforced that patient needs to follow gallbladder diet.  Return precautions reviewed Final  Clinical Impression(s) / ED Diagnoses Final diagnoses:  Generalized abdominal pain    Rx / DC Orders ED Discharge Orders  None       Charlesetta Shanks, MD 11/24/20 1606

## 2020-11-24 NOTE — Telephone Encounter (Signed)
Spoke with patient, she states that this morning she had pain and vomiting that came on all of a sudden. Pain was at waist line and stomach was swollen. Patient went to ED for evaluation and was told it may be her gallbladder. Patient has been scheduled for a follow up with Christianne Dolin on Thursday, 12/01/20 at 1:30 PM. Advised that if her pain returns or worsens prior to her appt she will need to go back to the ED or an urgent care for evaluation. Patient verbalized understanding and had no concerns at the end of the call.

## 2020-11-24 NOTE — ED Notes (Signed)
Pt given water - ok per Dr. Johnney Killian

## 2020-12-01 ENCOUNTER — Ambulatory Visit: Payer: Medicare HMO | Admitting: Gastroenterology

## 2020-12-01 ENCOUNTER — Encounter: Payer: Self-pay | Admitting: Gastroenterology

## 2020-12-01 VITALS — BP 156/62 | HR 84 | Ht 62.0 in | Wt 157.0 lb

## 2020-12-01 DIAGNOSIS — K8010 Calculus of gallbladder with chronic cholecystitis without obstruction: Secondary | ICD-10-CM | POA: Diagnosis not present

## 2020-12-01 DIAGNOSIS — R1011 Right upper quadrant pain: Secondary | ICD-10-CM | POA: Diagnosis not present

## 2020-12-01 NOTE — Progress Notes (Addendum)
12/01/2020 SEMONE ORLOV [id number] 05-03-1935   HISTORY OF PRESENT ILLNESS: This is an 86 year old female who is a patient of Dr. Doyne Keel.  She is here today with complaints of abdominal pain.  She was seen by Dr. Havery [name] in 2018 for upper abdominal pain after having a CT scan that showed gallstones, moderate hiatal hernia, small epigastric ventral and umbilical hernia.  Proceeded with an EGD that showed a 4 cm hiatal hernia and some gastritis, biopsies negative for H. pylori.  She was referred to general surgery who she had seen in the past for the same pain, but she did not follow through as she had no recurrent symptoms at that time.  Then she was seen by Dr. Havery [name] again in June 2021 with similar complaints.  Once again he suspected that it could be gallbladder in etiology.  She then continued to do well again until just last week.  She developed sudden onset of severe upper abdominal pain.  She says that it was like labor pains and would wax and wane in intensity.  She went to the emergency department and CT scan of the abdomen and pelvis without contrast was performed and once again showed cholelithiasis, unchanged moderate hiatal hernia, but no acute findings to explain her symptoms.  CBC, LFTs were normal.  Lipase just minimally elevated at 59.  Her pain has improved since that time, but she still remains sore in her upper abdomen.  This was associated with vomiting around the time the pain began.  Of note, she had a reflux surgery 1988 and had a paraesophageal hiatal hernia repaired along with that.  Colonoscopy 06/2011 - Dr. Earlean Shawl, no reportavailable today, she thinks it was normal EGD 05/2010 - Dr. Earlean Shawl, distal esophageal narrowing s/p dilation to 74m, hiatal hernia, inflamed gastric polyps, gastritis - HP negative  CT scan abdomen / pelvis 10/22/2016 - cholelithiasis, moderate hiatal hernia, diverticulosis, small epigastric ventral hernia and umbilical hernia  EGD  113/24/40-  - A 4 cm hiatal hernia was present. - The exam of the esophagus was otherwise normal. No stenosis / stricture appreciated. - Diffuse mildly erythematous mucosa was found in the gastric antrum without focal ulceration. Biopsies were taken with a cold forceps from the antrum, body, and incisura for Helicobacter pylori testing. - A few small sessile polyps were found in the gastric body. Two representative polyps were removed with a cold biopsy forceps. Resection and retrieval were complete. - The exam of the stomach was otherwise normal. - The duodenal bulb and second portion of the duodenum were normal.  Diagnosis 1. Surgical [P], gastric antrum and gastric body - REACTIVE GASTROPATHY. - NEGATIVE FOR HELICOBACTER PYLORI. - NO INTESTINAL METAPLASIA, DYSPLASIA, OR MALIGNANCY. 2. Surgical [P], stomach, polyp (multiple) - HYPERPLASTIC POLYP(S). - NEGATIVE FOR HELICOBACTER PYLORI. - NO INTESTINAL METAPLASIA, DYSPLASIA, OR MALIGNANCY.   Past Medical History:  Diagnosis Date  . [place]   . Coronary artery disease cardiologist--- dr cBurt Knack  hx NSTEMI  w/ cardiac cath 03-16-2005 PCI with DES to LCx;   cardiac cath 06-27-2005  PCI with DES to RCA with residual dx LAD manage medcially;  lexiscan 01-26-20211 normal no ischemia, ef 70%;  stress echo w/ dobutamine 10-24-2011 negative ishcmeia , normal ef // Myoview 2/22: EF 67, no ischemia or infarction, no TID, low risk   . [place] type 2[place]    followed by pcp  (10-19-2020  pt stated checks daily in am,  fasting blood surgar--- 115--120s)  . DOE (dyspnea on exertion)    per pt "when I over do",  ok with household chores  . Echocardiogram 08/2020    Echocardiogram 2/22: EF 55-60, no RWMA, mild LVH, Gr 2 DD, GLS-21.7%, normal RVSF, trivial MR, RVSP 39.5  . Edema of right lower extremity   . GERD (gastroesophageal reflux disease)   . Hiatal hernia    recurrence,  hx HH repair 1989  .  History of cervical cancer    s/p  vaginal hysterectomy  . History of DVT of lower extremity 2016   11-29-2014 post op right TKA of right lower extremity and completed xarelto   . History of esophageal stricture    hx s/p dilatation's  . History of gastric ulcer 2005 approx.  Marland Kitchen History of palpitations 2010   event monitor 07-07-2009 showed NSR w/ freq. SVT ectopies with short runs, rare PVCs  . History of TIA (transient ischemic attack) 06/1999   12-15-2019  per pt had several TIA between 12/ 2000 to 02/ 2001 , was sent to specialist '@[facility]' , had test that was normal (10-19-2020 pt stated no TIAs since ) but has residual of essential tremors of right arm/ hand  . Hypertension   . Intermittent palpitations   . IT band syndrome   . Migraine    "ice pick headche lasts about 30 seconds"  . Mixed hyperlipidemia   . Mixed incontinence urge and stress   . Multiple thyroid nodules    followed by pcp---   ultrasound 11-22-2014 no bx   (12-15-2019 per pt had a endocrinologist and was told did not need bx)  . OA (osteoarthritis)    knees, elbow, hip, ankles  . Occasional tremors    right arm/ hand  s/p TIA residual 2000  . Osteoporosis    taking vitamin d  . Right bundle branch block (RBBB) with left anterior fascicular block (LAFB)   . RLS (restless legs syndrome)   . S/P drug eluting coronary stent placement 2006   03-16-2005  PCI x1 DES to LCx;   06-27-2005  PCI x1 DES to RCA  . Urinary retention    post op sling prodecure on 10-27-2020, has foley cathether   Past Surgical History:  Procedure Laterality Date  . ANTERIOR AND POSTERIOR REPAIR N/A 12/22/2019   Procedure: ANTERIOR (CYSTOCELE)  REPAIR;  Surgeon: Janyth Contes, MD;  Location: [facility];  Service: Gynecology;  Laterality: N/A;  . ANTERIOR AND POSTERIOR REPAIR WITH SACROSPINOUS FIXATION N/A 10/27/2020   Procedure: SACROSPINOUS LIGAMENT FIXATION;  Surgeon: Jaquita Folds, MD;  Location: [facility];  Service: Gynecology;  Laterality: N/A;  . BLADDER SUSPENSION N/A 10/27/2020   Procedure: TRANSVAGINAL TAPE (TVT) PROCEDURE;  Surgeon: Jaquita Folds, MD;  Location: [facility];  Service: Gynecology;  Laterality: N/A;  . CATARACT EXTRACTION W/ INTRAOCULAR LENS  IMPLANT, BILATERAL  2015  . COLONOSCOPY  last one ?  . CORONARY ANGIOPLASTY WITH STENT PLACEMENT  03-16-2005   dr Lia Foyer   PCI and DES x1 to LCx  . CORONARY ANGIOPLASTY WITH STENT PLACEMENT  06-27-2005  dr Lia Foyer   PCI and DES x1 to RCA with residual disease LAD 70-80% to manage medically  . CYSTOSCOPY N/A 10/27/2020   Procedure: CYSTOSCOPY;  Surgeon: Jaquita Folds, MD;  Location: [facility];  Service: Gynecology;  Laterality: N/A;  . CYSTOSCOPY N/A 11/09/2020   Procedure: CYSTOSCOPY;  Surgeon: Jaquita Folds, MD;  Location: [place];  Service: Gynecology;  Laterality: N/A;  . FOOT SURGERY Left 1990s   left foot stress fracture repair, per pt no hardware  . HIATAL HERNIA REPAIR  1989  . KNEE ARTHROSCOPY Bilateral right ?/   left x2 , last one 09-12-2009 @ [facility]  . PUBOVAGINAL SLING N/A 11/09/2020   Procedure: REVISION OF PUBO-VAGINAL SLING;  Surgeon: Jaquita Folds, MD;  Location: [facility];  Service: Gynecology;  Laterality: N/A;  . RECTOCELE REPAIR N/A 04/20/2020   Procedure: POSTERIOR REPAIR (RECTOCELE);  Surgeon: Janyth Contes, MD;  Location: [facility];  Service: Gynecology;  Laterality: N/A;  . RECTOCELE REPAIR N/A 10/27/2020   Procedure: POSTERIOR REPAIR (RECTOCELE);  Surgeon: Jaquita Folds, MD;  Location: [facility];  Service: Gynecology;  Laterality: N/A;  total time requested for all procedures is 2 hours  . TOTAL KNEE ARTHROPLASTY  11/12/2011   Procedure: TOTAL KNEE ARTHROPLASTY;  Surgeon: Lorn Junes, MD;  Location: [facility];  Service: Orthopedics;  Laterality: Left;  Dr  Noemi Chapel wants 90 minutes for this case  . TOTAL KNEE ARTHROPLASTY Right 11/29/2014   Procedure: RIGHT TOTAL KNEE ARTHROPLASTY;  Surgeon: Vickey Huger, MD;  Location: [place];  Service: Orthopedics;  Laterality: Right;  . UPPER GASTROINTESTINAL ENDOSCOPY  last one 04-25-2017   with dilatation esophageal stricture and savary dilatation  . VAGINAL HYSTERECTOMY  1988    no ovaries removed for bleeeding    reports that she has never smoked. She has never used smokeless tobacco. She reports that she does not drink alcohol and does not use drugs. family history includes Arthritis in an other family member; Cancer in her brother; Coronary artery disease in her mother; Depression in her brother; Diabetes in her brother and daughter; Heart disease in her mother; Hypertension in her daughter and another family member; Other in an other family member; Stroke in her brother and father; Thyroid disease in her daughter. Allergies  Allergen Reactions  . Baclofen Other (See Comments)    Hyperactivity   . Nitrofurantoin Rash      Outpatient Encounter Medications as of 12/01/2020  Medication Sig  . acetaminophen (TYLENOL) 500 MG tablet Take 1,000 mg by mouth every 6 (six) hours as needed.  Marland Kitchen amLODipine (NORVASC) 10 MG tablet Take 1 tablet (10 mg total) by mouth daily. (Patient taking differently: Take 10 mg by mouth daily.)  . Ascorbic Acid (VITAMIN C) 500 MG CHEW Chew by mouth daily.  Marland Kitchen aspirin EC 81 MG tablet Take 1 tablet (81 mg total) by mouth daily.  . Blood Glucose Monitoring Suppl (TRUE METRIX AIR GLUCOSE METER) w/Device KIT USE TO CHECK BLOOD SUGAR ONCE DAILY AND AS NEEDED.  DX CODE E11.9  . Cholecalciferol (VITAMIN D) 50 MCG (2000 UT) CAPS Take 2,000 Units by mouth daily.   . famotidine (PEPCID) 20 MG tablet Take 20 mg by mouth at bedtime as needed for heartburn or indigestion.  Marland Kitchen glucose blood (TRUE METRIX BLOOD GLUCOSE TEST) test strip USE TO CHECK BLOOD SUGAR ONCE A DAY OR AS NEEDED.  DX CODE: E11.9   . hyoscyamine (LEVSIN SL) 0.125 MG SL tablet Place 1 tablet (0.125 mg total) under the tongue every 4 (four) hours as needed.  . isosorbide mononitrate (IMDUR) 60 MG 24 hr tablet Take 1 tablet (60 mg total) by mouth daily.  Marland Kitchen ketorolac (TORADOL) 10 MG tablet Take 1 tablet (10 mg total) by mouth every 6 (six) hours as needed.  Marland Kitchen  losartan (COZAAR) 100 MG tablet Take 1 tablet (100 mg total) by mouth daily.  . magnesium oxide (MAG-OX) 400 MG tablet Take 400 mg by mouth 2 (two) times daily.  . metoprolol succinate (TOPROL-XL) 50 MG 24 hr tablet Take 1.5 tablets (75 mg total) by mouth at bedtime. Take with or immediately following a meal. (Patient taking differently: Take 75 mg by mouth at bedtime. Take with or immediately following a meal.)  . Multiple Vitamins-Minerals (ZINC PO) Take by mouth daily.  . nitroGLYCERIN (NITROSTAT) 0.4 MG SL tablet Place 1 tablet (0.4 mg total) under the tongue every 5 (five) minutes as needed for chest pain.  Marland Kitchen omeprazole (PRILOSEC) 20 MG capsule Take 1 capsule (20 mg total) by mouth daily as needed. (Patient taking differently: Take 20 mg by mouth daily.)  . ondansetron (ZOFRAN ODT) 4 MG disintegrating tablet Take 1 tablet (4 mg total) by mouth every 6 (six) hours as needed for nausea or vomiting.  . potassium chloride SA (KLOR-CON) 20 MEQ tablet Take 0.5 tablets (10 mEq total) by mouth daily.  . pramipexole (MIRAPEX) 0.25 MG tablet Take 1 tablet (0.25 mg total) by mouth 2 (two) times daily. (Patient taking differently: Take 0.25 mg by mouth 2 (two) times daily.)  . promethazine (PHENERGAN) 12.5 MG tablet Take 1 tablet (12.5 mg total) by mouth every 8 (eight) hours as needed for nausea or vomiting.  . simvastatin (ZOCOR) 20 MG tablet TAKE 1 TABLET AT BEDTIME (Patient taking differently: Take by mouth at bedtime.)  . spironolactone (ALDACTONE) 25 MG tablet Take 0.5 tablets (12.5 mg total) by mouth 3 (three) times a week. (Patient taking differently: Take 12.5 mg by mouth 3  (three) times a week. Monday /  Wednesday/ Friday)  . TRUEplus Lancets 33G MISC USE TO CHECK BLOOD SUGAR ONCE DAILY AND AS NEEDED.  DX CODE: E11.9  . [DISCONTINUED] gabapentin (NEURONTIN) 100 MG capsule Take 1 capsule (100 mg total) by mouth 3 (three) times daily.  . [DISCONTINUED] polyethylene glycol powder (GAVILAX) 17 GM/SCOOP powder Take 17 g by mouth daily. (Patient taking differently: No sig reported)   No facility-administered encounter medications on file as of 12/01/2020.     REVIEW OF SYSTEMS  : All other systems reviewed and negative except where noted in the History of Present Illness.   PHYSICAL EXAM: BP (!) 156/62   Pulse 84   Ht '5\' 2"'  (1.575 m)   Wt 157 lb (71.2 kg)   LMP  (LMP Unknown)   BMI 28.72 kg/m  General: Well developed white female in no acute distress Head: Normocephalic and atraumatic Eyes:  Sclerae anicteric, conjunctiva pink. Ears: Normal auditory acuity Lungs: Clear throughout to auscultation; no W/R/R. Heart: Regular rate and rhythm; no M/R/G. Abdomen: Soft, non-distended.  BS present.  Moderate RUQ TTP. Musculoskeletal: Symmetrical with no gross deformities  Skin: No lesions on visible extremities Extremities: No edema  Neurological: Alert oriented x 4, grossly non-focal Psychological:  Alert and cooperative. Normal mood and affect  ASSESSMENT AND PLAN: *86 year old female with complaints of right upper quadrant abdominal pain.  Last week had an episode or attack of pain reminiscent of, but worse than previous episodes.  She had been seen for similar pain in 2018 and had an EGD that did not explain her pain.  She does have cholelithiasis that was once again seen on recent CT scan without any other source identified.  Lab studies unremarkable.  She remains tender in her right upper quadrant on exam today.  I  really think that this is gallbladder related.  She has seen Dr. Donne Hazel in the past.  We will refer her back to [facility] to get their input.   CC:   Mosie Lukes, MD

## 2020-12-01 NOTE — Patient Instructions (Signed)
If you are age 84 or older, your body mass index should be between 23-30. Your Body mass index is 28.72 kg/m. If this is out of the aforementioned range listed, please consider follow up with your Primary Care Provider.  If you are age 42 or younger, your body mass index should be between 19-25. Your Body mass index is 28.72 kg/m. If this is out of the aformentioned range listed, please consider follow up with your Primary Care Provider.   __________________________________________________________  The [facility] providers would like to encourage you to use [facility] to communicate with providers for non-urgent requests or questions.  Due to long hold times on the telephone, sending your provider a message by [facility] may be a faster and more efficient way to get a response.  Please allow 48 business hours for a response.  Please remember that this is for non-urgent requests.   Follow up with [facility] - Dr. Johney Maine on 01/04/21 @ 9:30 am

## 2020-12-02 ENCOUNTER — Telehealth (HOSPITAL_BASED_OUTPATIENT_CLINIC_OR_DEPARTMENT_OTHER): Payer: Self-pay | Admitting: *Deleted

## 2020-12-02 ENCOUNTER — Other Ambulatory Visit (HOSPITAL_BASED_OUTPATIENT_CLINIC_OR_DEPARTMENT_OTHER): Payer: Self-pay | Admitting: *Deleted

## 2020-12-02 DIAGNOSIS — N390 Urinary tract infection, site not specified: Secondary | ICD-10-CM

## 2020-12-02 MED ORDER — SULFAMETHOXAZOLE-TRIMETHOPRIM 800-160 MG PO TABS: 1.0000 | ORAL_TABLET | Freq: Two times a day (BID) | ORAL | 0 refills | Status: AC

## 2020-12-02 NOTE — Progress Notes (Signed)
Agree with assessment and plan as outlined.  

## 2020-12-02 NOTE — Telephone Encounter (Signed)
Called pt to inform her that I spoke with Dr. [name] Heck regarding her s/s. Advised that an antibiotic has been sent to her pharmacy.  Advised for pt to call us back if her symptoms do not improve. Pt verbalized understanding.

## 2020-12-12 ENCOUNTER — Ambulatory Visit (INDEPENDENT_AMBULATORY_CARE_PROVIDER_SITE_OTHER): Payer: Medicare HMO | Admitting: Pharmacist

## 2020-12-12 DIAGNOSIS — I10 Essential (primary) hypertension: Secondary | ICD-10-CM

## 2020-12-12 DIAGNOSIS — E1169 Type 2 diabetes mellitus with other specified complication: Secondary | ICD-10-CM

## 2020-12-12 DIAGNOSIS — E785 Hyperlipidemia, unspecified: Secondary | ICD-10-CM | POA: Diagnosis not present

## 2020-12-12 DIAGNOSIS — E1121 Type 2 diabetes mellitus with diabetic nephropathy: Secondary | ICD-10-CM | POA: Diagnosis not present

## 2020-12-12 DIAGNOSIS — M85851 Other specified disorders of bone density and structure, right thigh: Secondary | ICD-10-CM

## 2020-12-12 DIAGNOSIS — G2581 Restless legs syndrome: Secondary | ICD-10-CM

## 2020-12-12 NOTE — Chronic Care Management (AMB) (Signed)
Chronic Care Management Pharmacy Note  12/12/2020 Name:  Heather [name] MRN:  [id number] DOB:  October 15, 1934  Summary: Multiple surgeries recently for rectocele repair and revision of pubo-vaginal sling Patient is doing well.   Recommendations/Changes made from today's visit: Consider rechecking DEXA and treatment of low BMD due to high 64yrfracture risk (FRAX)    Subjective: WMELISSE CAETANOis an 86y.o. year old female who is a primary patient of BMosie Lukes MD.  The CCM team was consulted for assistance with disease management and care coordination needs.   Engaged with patient by telephone for follow up visit in response to provider referral for pharmacy case management and/or care coordination services.   Consent to Services:  The patient was given information about Chronic Care Management services, agreed to services, and gave verbal consent prior to initiation of services.  Please see initial visit note for detailed documentation.   Patient Care Team: BMosie Lukes MD as PCP - General (Family Medicine) CSherren Mocha MD as PCP - Cardiology (Cardiology) [place]Idelle Leech [place](Optometry) BJanyth Contes MD as Consulting Physician (Obstetrics and Gynecology) WTrula Slade DPM as Consulting Physician (Podiatry) WSharmon Revereas Physician Assistant (Cardiology) ECherre Robins PharmD (Pharmacist)  Recent office visits: 07/14/2020 - PCP (Dr BCharlett Blake F/U chronic medication concerns. Rx for One Touch Verio and supplies.  Recent consult visits: 12/01/2020 - GI ([place], [place] upper right quadrant abdominal pain. CT showed cholethiasis. Referred back to [facility] for evaluation for surgery.  11/24/2020 - GYN (Dr MSabra Heck right upper quadrant pain with acute N/V. Urethral sling exam normal; Sent to ER. 11/08/20- URO GYN (Dr Schroeder)sling revision/ release and cystoscopy 10/11/2020 - Uro-GYN (Dr SWannetta Sender review urodynamic testing. Recommended sling treatment for  stress incontinence and possibly PTNS after surgery if OAB still present after surgery 10/06/20- URO-GYN (Dr SWannetta Sender urodynamic evaluation.  09/21/20- URO-GYN (Dr SWannetta Sender initial visit for prolapse and incontinence. Prescribed sulfamethoxazole -Trimethoprim DS 1 tablet bid 09/19/20- GYN (Dr BSandford Craze For painful micturition. Unable to see full OV notes 09/14/2020 - [facility] palpit[facility]. Lowered dose of potassium from 268m to 10 mEq daily; Added spironolactone 12.83m61maily  Hospital visits: 11/24/2020 - ED - abdominal pain. Sent from GYN office.  CT scan.  No acute findings.  No changes in chronic underlying hernia findings and extensive gallstones in the gallbladder without cholecystitis. Lipase is marginally elevated. Recommended phenergan or ondansetron as needed for N/V and tramadol as needed for pain; gallbladder diet.  11/09/2020 - Admission to OR for urinary retention after rectocele repair. Revision completed on pubo-vaginal sling.   11/03/2020 - Hospitalization / [facility] to RLE pain and swelling. No medications added, stopped or changed. .       Objective:  Lab Results  Component Value Date   CREATININE 0.89 11/24/2020   CREATININE 1.40 (H) 11/09/2020   CREATININE 1.00 10/27/2020    Lab Results  Component Value Date   HGBA1C 7.1 (H) 07/14/2020   Last diabetic Eye exam:  Lab Results  Component Value Date/Time   HMDIABEYEEXA No Retinopathy 08/08/2020 12:00 AM    Last diabetic Foot exam: No results found for: HMDIABFOOTEX      Component Value Date/Time   CHOL 150 07/14/2020 0921   TRIG 117.0 07/14/2020 0921   HDL 62.10 07/14/2020 0921   CHOLHDL 2 07/14/2020 0921   VLDL 23.4 07/14/2020 0921   LDLCALC 65 07/14/2020 0921   LDLDIRECT 73.4  05/20/2014 1651    Hepatic Function Latest Ref Rng & Units 11/24/2020 07/14/2020 04/18/2020  Total Protein 6.5 - 8.1 g/dL 6.5 6.2 6.6  Albumin 3.5 - 5.0 g/dL 4.3  4.3 4.1  AST 15 - 41 U/L '16 16 24  ' ALT 0 - 44 U/L '17 15 18  ' Alk Phosphatase 38 - 126 U/L 75 89 74  Total Bilirubin 0.3 - 1.2 mg/dL 0.5 0.5 1.0  Bilirubin, Direct 0.0 - 0.3 mg/dL - - -    Lab Results  Component Value Date/Time   TSH 2.07 07/14/2020 09:21 AM   TSH 3.17 10/13/2019 09:09 AM    CBC Latest Ref Rng & Units 11/24/2020 11/09/2020 10/27/2020  WBC 4.0 - 10.5 K/uL 7.9 - -  Hemoglobin 12.0 - 15.0 g/dL 13.1 12.6 13.9  Hematocrit 36.0 - 46.0 % 39.9 37.0 41.0  Platelets 150 - 400 K/uL 187 - -    No results found for: VD25OH  Clinical ASCVD: Yes  The ASCVD Risk score Mikey Bussing DC Jr., et al., 2013) failed to calculate for the following reasons:   The 2013 ASCVD risk score is only valid for ages 7 to 42    Other: DEXA 06/02/2018 Lowest T-Score = -1.6 at right femur neck; FRAX was 13.6% for major fracture and 3.6% for hip fracture  EF = 55-60% (08/26/2020(  Social History   Tobacco Use  Smoking Status Never Smoker  Smokeless Tobacco Never Used   BP Readings from Last 3 Encounters:  12/01/20 (!) 156/62  11/24/20 (!) 149/62  11/24/20 (!) 152/58   Pulse Readings from Last 3 Encounters:  12/01/20 84  11/24/20 (!) 49  11/24/20 (!) 58   Wt Readings from Last 3 Encounters:  12/01/20 157 lb (71.2 kg)  11/24/20 160 lb (72.6 kg)  11/24/20 160 lb (72.6 kg)    Assessment: Review of patient past medical history, allergies, medications, health status, including review of consultants reports, laboratory and other test data, was performed as part of comprehensive evaluation and provision of chronic care management services.   SDOH:  (Social Determinants of Health) assessments and interventions performed:  SDOH Interventions   Flowsheet Row Most Recent Value  SDOH Interventions   Financial Strain Interventions Intervention Not Indicated  Physical Activity Interventions Other (Comments)  [patient is recovering from surgery,  has been advised not to exercise at this time.]       CCM Care Plan  Allergies  Allergen Reactions  . Baclofen Other (See Comments)    Hyperactivity   . Nitrofurantoin Rash    Medications Reviewed Today    Reviewed by [name], PharmD (Pharmacist) on 12/12/20 at 50  Med List Status: <None>  Medication Order Taking? Sig Documenting Provider Last Dose Status Informant  acetaminophen (TYLENOL) 500 MG tablet [id number] Yes Take 1,000 mg by mouth every 6 (six) hours as needed. [provider] Taking Active Self  amLODipine (NORVASC) 10 MG tablet [id number] Yes Take 1 tablet (10 mg total) by mouth daily.  Patient taking differently: Take 10 mg by mouth daily.   Mosie Lukes, MD Taking Active   Ascorbic Acid (VITAMIN C) 500 MG CHEW [id number] Yes Chew by mouth daily. [provider] Taking Active Self  aspirin EC 81 MG tablet [id number] Yes Take 1 tablet (81 mg total) by mouth daily. [name], MD Taking Active Self  Blood Glucose Monitoring Suppl (TRUE METRIX AIR GLUCOSE METER) w/Device KIT [id number] Yes USE TO CHECK BLOOD SUGAR ONCE DAILY AND AS NEEDED.  DX CODE E11.9  Mosie Lukes, MD Taking Active Self  Cholecalciferol (VITAMIN D) 50 MCG (2000 UT) CAPS [id number] Yes Take 2,000 Units by mouth daily.  [provider] Taking Active Self  famotidine (PEPCID) 20 MG tablet [id number] Yes Take 20 mg by mouth at bedtime as needed for heartburn or indigestion. [provider] Taking Active Self  glucose blood (TRUE METRIX BLOOD GLUCOSE TEST) test strip [id number] Yes USE TO CHECK BLOOD SUGAR ONCE A DAY OR AS NEEDED.  DX CODE: E11.9 Mosie Lukes, MD Taking Active Self  hyoscyamine (LEVSIN SL) 0.125 MG SL tablet [id number] Yes Place 1 tablet (0.125 mg total) under the tongue every 4 (four) hours as needed. Mosie Lukes, MD Taking Active Self           Med Note Briant Cedar   Fri Aug 19, 2020 12:27 PM)    isosorbide mononitrate (IMDUR) 60 MG 24 hr tablet [id number] Yes Take 1 tablet (60 mg total)  by mouth daily. [name] T, PA-C Taking Active Self  ketorolac (TORADOL) 10 MG tablet [id number] Yes Take 1 tablet (10 mg total) by mouth every 6 (six) hours as needed. Jaquita Folds, MD Taking Active   losartan (COZAAR) 100 MG tablet [id number] Yes Take 1 tablet (100 mg total) by mouth daily. Mosie Lukes, MD Taking Active   magnesium oxide (MAG-OX) 400 MG tablet [id number] Yes Take 400 mg by mouth 2 (two) times daily. [provider] Taking Active Self  metoprolol succinate (TOPROL-XL) 50 MG 24 hr tablet [id number] Yes Take 1.5 tablets (75 mg total) by mouth at bedtime. Take with or immediately following a meal.  Patient taking differently: Take 75 mg by mouth at bedtime. Take with or immediately following a meal.   [name] T, PA-C Taking Active Self  Multiple Vitamins-Minerals (ZINC PO) [id number] Yes Take by mouth daily. [provider] Taking Active   nitroGLYCERIN (NITROSTAT) 0.4 MG SL tablet [id number] Yes Place 1 tablet (0.4 mg total) under the tongue every 5 (five) minutes as needed for chest pain. [name] T, PA-C Taking Active   omeprazole (PRILOSEC) 20 MG capsule [id number] Yes Take 1 capsule (20 mg total) by mouth daily as needed.  Patient taking differently: Take 20 mg by mouth daily.   Mosie Lukes, MD Taking Active Self  ondansetron (ZOFRAN ODT) 4 MG disintegrating tablet [id number] Yes Take 1 tablet (4 mg total) by mouth every 6 (six) hours as needed for nausea or vomiting. Yetta Flock, MD Taking Active   potassium chloride SA (KLOR-CON) 20 MEQ tablet [id number] Yes Take 0.5 tablets (10 mEq total) by mouth daily. [name] T, PA-C Taking Active   pramipexole (MIRAPEX) 0.25 MG tablet [id number] Yes Take 1 tablet (0.25 mg total) by mouth 2 (two) times daily.  Patient taking differently: Take 0.25 mg by mouth 2 (two) times daily.   Mosie Lukes, MD Taking Active Self  promethazine (PHENERGAN) 12.5 MG tablet [id number] Yes Take 1  tablet (12.5 mg total) by mouth every 8 (eight) hours as needed for nausea or vomiting. Jaquita Folds, MD Taking Active   simvastatin (ZOCOR) 20 MG tablet [id number] Yes TAKE 1 TABLET AT BEDTIME  Patient taking differently: Take by mouth at bedtime.   [name] T, PA-C Taking Active Self  spironolactone (ALDACTONE) 25 MG tablet [id number] Yes Take 0.5 tablets (12.5 mg total) by mouth 3 (three) times a week.  Patient taking differently: Take 12.5 mg by mouth 3 (three) times a week.  Monday /  Marlana Latus Friday   [name] T, PA-C Taking Active Self  traMADol (ULTRAM) 50 MG tablet [id number] No Take 50 mg by mouth every 12 (twelve) hours as needed for severe pain.  Patient not taking: Reported on 12/12/2020   [provider] Not Taking Active   TRUEplus Lancets 33G MISC [id number] Yes USE TO CHECK BLOOD SUGAR ONCE DAILY AND AS NEEDED.  DX CODE: E11.9 Mosie Lukes, MD Taking Active Self          Patient Active Problem List   Diagnosis Date Noted  . RUQ pain 12/01/2020  . Calculus of gallbladder with cholecystitis without biliary obstruction 12/01/2020  . Low back pain radiating to right leg 07/14/2020  . 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 Prolapse of female pelvic organs 12/22/2019  . Pelvic relaxation 12/22/2019  . Right hip pain 10/13/2019  . Left foot pain 03/08/2019  . Pelvic prolapse 03/08/2019  . Pain of breast 01/29/2019  . Overactive bladder 01/29/2019  . Educated about COVID-19 virus infection 12/02/2018  . Hyperlipidemia associated with type 2 diabetes mellitus ([place]) 05/27/2017  . Right shoulder pain 04/29/2015  . Diarrhea 04/17/2015  . Headache 04/17/2015  . Multinodular goiter 01/17/2015  . Peroneal DVT (deep venous thrombosis) ([place]) 12/22/2014  . S/P total knee arthroplasty 11/29/2014  . Arthritis of knee, degenerative 11/19/2014  . Right knee pain 11/19/2014  . Neck pain 10/29/2014  . Insomnia 02/05/2014  . Preventative health  care 11/22/2013  . RLS (restless legs syndrome) 10/04/2013  . Lower urinary tract infectious disease 10/04/2013  . Cataracts, bilateral 04/02/2013  . Leg swelling 01/17/2012  . Hypokalemia 01/05/2012  . Anemia 01/05/2012  . Depression with anxiety 01/05/2012  . Postoperative anemia due to acute blood loss 11/15/2011  . 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 Pre-syncope 10/13/2011  . DM (diabetes mellitus) ([place]) 10/06/2011  . Pulmonary nodule 10/06/2011  . Epigastric pain 10/06/2011  . DJD (degenerative joint disease) of knee 09/18/2011  . It band syndrome, right 08/28/2011  . Renal insufficiency 08/28/2011  . CORONARY ATHEROSCLEROSIS NATIVE CORONARY ARTERY 03/07/2010  . Coronary atherosclerosis 01/18/2009  . FATIGUE 01/18/2009  . PERSISTENT DISORDER INITIATING/MAINTAINING SLEEP 09/09/2008  . DERMATITIS 10/16/2007  . PERIPHERAL EDEMA 10/16/2007  . PALPITATIONS 10/16/2007  . Essential hypertension 04/14/2007  . GERD 04/14/2007  . TRANSIENT ISCHEMIC ATTACK, HX OF 04/14/2007    Immunization History  Administered Date(s) Administered  . Influenza Whole 06/08/2005, 06/29/2010  . Influenza, High Dose Seasonal PF 04/05/2015, 06/18/2016, 05/27/2017, 05/30/2018  . Influenza, Quadrivalent, Recombinant, Inj, Pf 04/22/2019  . Influenza,inj,Quad PF,6+ Mos 04/30/2014  . Influenza,inj,quad, With Preservative 05/06/2020  . Moderna Sars-Covid-2 Vaccination 07/21/2019, 08/18/2019  . PFIZER(Purple Top)SARS-COV-2 Vaccination 05/06/2020  . Pneumococcal Conjugate-13 11/17/2013  . Pneumococcal Polysaccharide-23 07/09/2000, 05/27/2017  . Tdap 05/24/2015    Conditions to be addressed/monitored: CHF, CAD, HTN, HLD, DMII and GERD; restless leg syndrome; osteopenia; urinary incontinance; rectocele; prolapse;  There are no care plans that you recently modified to display for this patient.   Medication Assistance: None required.  Patient affirms current coverage meets needs.  Patient's  preferred pharmacy is:  [facility] [place] [address] Phone: [phone] Fax: [phone]  [id number] [address]: [phone]: [phone]  Follow Up:  Patient agrees to Care Plan and Follow-up.  Plan: The care management  team will reach out to the patient again over the next 90  days.  [name], PharmD Clinical Pharmacist [facility] [phone]

## 2020-12-12 NOTE — Patient Instructions (Signed)
Heather [name], It was a pleasure speaking with you today. Please feel free to contact me if you have any questions or concerns. Below is information regarding you health goals.   [name], PharmD Clinical Pharmacist [facility] [phone]  Visit Information  PATIENT GOALS: Goals Addressed            This Visit's Progress   . Chronic Care Management Pharmacy Care Plan   Not on track    [place] (see longitudinal plan of care for additional care plan information)  Current Barriers:  . Chronic Disease Management support, education, and care coordination needs related to Hypertension, Hyperlipidemia/CAD, Diabetes, GERD, RLS   Hypertension / heart failure:  BP Readings from Last 3 Encounters:  12/01/20 (!) 156/62  11/24/20 (!) 149/62  11/24/20 (!) 152/58   . Pharmacist Clinical Goal(s): o Over the next 90 days, patient will work with PharmD and providers to achieve BP goal <140/90 . Current regimen:  . Metoprolol Succinate 50mg  - take 1.5 tablets = 75mg  daily (increased recently by cardiologist)  . Amlodipine 10mg  daily  . Losartan 100mg  daily . Spironolactone 25mg  - take 0.5 tablet = 12.5mg  on Mondays, Wednesdays and Fridays . Potassium chloride 17mEq - take 0.5 tablet = 10 mEq once a day.  . Interventions: o Requested patient to check blood pressure 1 to 2 times per week and record o Discussed blood pressure goal . Patient self care activities - Over the next 90 days, patient will: o Check BP 1 to 2 times per week, document, and provide at future appointments o Ensure daily salt intake < 2300 mg/day o Continue current regimen for blood pressure and heart- has follow up with cardiologist next week.   Hyperlipidemia/CAD Lab Results  Component Value Date/Time   LDLCALC 65 07/14/2020 09:21 AM   LDLDIRECT 73.4 05/20/2014 04:51 PM   . Pharmacist Clinical Goal(s): o Over the next 90 days, patient will work with PharmD and  providers to maintain LDL goal < 70 . Current regimen:  . Simvastatin 20mg  daily at bedtime . Aspirin 81mg  once daily . Interventions: o Discussed LDL goal . Patient self care activities - Over the next 90 days, patient will: o Maintain cholesterol medication regimen.   Diabetes Lab Results  Component Value Date/Time   HGBA1C 7.1 (H) 07/14/2020 09:21 AM   HGBA1C 7.2 (H) 10/13/2019 09:09 AM   . Pharmacist Clinical Goal(s): o Over the next 90 days, patient will work with PharmD and providers to achieve A1c goal  <7.5% . Current regimen:  o Diet and exercise management   . Interventions: o Discussed diet and exercise; recommended continued avoidance of sugary beverages; limit serving sizes of high carbohydrate foods (bread, potatoes, rice and pasta)  o Reviewed home blood glucose readings and reviewed goals  - Fasting blood glucose goal (before meals) = 80 to 130 - Blood glucose goal after a meal = less than 180  . Patient self care activities - Over the next 90 days, patient will: o Limit intake of food that can increase blood glucose o Continue to check blood glucose 1 to 2 times per week  Acid Reflux / GERD:  . Pharmacist Clinical Goal(s): o Over the next 90 days, patient will work with PharmD and providers to decrease acid reflux symptoms . Current regimen:  o Famotidine 20mg  once a day at night if needed o Omeprazole 20mg  once a day   . Interventions: o Discussed medications used to control  acid reflux o Goal is to use lowest dose needed to control symptoms . Patient self care activities - Over the next 90 days, patient will: o Continue current regimen   Medication management . Pharmacist Clinical Goal(s): o Over the next 90 days, patient will work with PharmD and providers to maintain optimal medication adherence . Current pharmacy: [facility] . Interventions o Comprehensive medication review performed. o Continue current medication management  strategy . Patient self care activities - Over the next 90 days, patient will: o Focus on medication adherence by filling and taking medications appropriately  o Take medications as prescribed o Report any questions or concerns to PharmD and/or provider(s)  Please see past updates related to this goal by clicking on the "Past Updates" button in the selected goal         Patient verbalizes understanding of instructions provided today and agrees to view in [place].   The care management team will reach out to the patient again over the next 90 days.    Diabetes Mellitus and Nutrition, Adult When you have diabetes, or diabetes mellitus, it is very important to have healthy eating habits because your blood sugar (glucose) levels are greatly affected by what you eat and drink. Eating healthy foods in the right amounts, at about the same times every day, can help you:  Control your blood glucose.  Lower your risk of heart disease.  Improve your blood pressure.  Reach or maintain a healthy weight. What can affect my meal plan? Every person with diabetes is different, and each person has different needs for a meal plan. Your health care provider may recommend that you work with a dietitian to make a meal plan that is best for you. Your meal plan may vary depending on factors such as:  The calories you need.  The medicines you take.  Your weight.  Your blood glucose, blood pressure, and cholesterol levels.  Your activity level.  Other health conditions you have, such as heart or kidney disease. How do carbohydrates affect me? Carbohydrates, also called carbs, affect your blood glucose level more than any other type of food. Eating carbs naturally raises the amount of glucose in your blood. Carb counting is a method for keeping track of how many carbs you eat. Counting carbs is important to keep your blood glucose at a healthy level, especially if you use insulin or take certain oral  diabetes medicines. It is important to know how many carbs you can safely have in each meal. This is different for every person. Your dietitian can help you calculate how many carbs you should have at each meal and for each snack. How does alcohol affect me? Alcohol can cause a sudden decrease in blood glucose (hypoglycemia), especially if you use insulin or take certain oral diabetes medicines. Hypoglycemia can be a life-threatening condition. Symptoms of hypoglycemia, such as sleepiness, dizziness, and confusion, are similar to symptoms of having too much alcohol.  Do not drink alcohol if: ? Your health care provider tells you not to drink. ? You are pregnant, may be pregnant, or are planning to become pregnant.  If you drink alcohol: ? Do not drink on an empty stomach. ? Limit how much you use to:  0-1 drink a day for women.  0-2 drinks a day for men. ? Be aware of how much alcohol is in your drink. In the [place] one drink equals one 12 oz bottle of beer (355 mL), one 5 oz glass  of wine (148 mL), or one 1 oz glass of hard liquor (44 mL). ? Keep yourself hydrated with water, diet soda, or unsweetened iced tea.  Keep in mind that regular soda, juice, and other mixers may contain a lot of sugar and must be counted as carbs. What are tips for following this plan? Reading food labels  Start by checking the serving size on the "Nutrition Facts" label of packaged foods and drinks. The amount of calories, carbs, fats, and other nutrients listed on the label is based on one serving of the item. Many items contain more than one serving per package.  Check the total grams (g) of carbs in one serving. You can calculate the number of servings of carbs in one serving by dividing the total carbs by 15. For example, if a food has 30 g of total carbs per serving, it would be equal to 2 servings of carbs.  Check the number of grams (g) of saturated fats and trans fats in one serving. Choose foods that  have a low amount or none of these fats.  Check the number of milligrams (mg) of salt (sodium) in one serving. Most people should limit total sodium intake to less than 2,300 mg per day.  Always check the nutrition information of foods labeled as "low-fat" or "nonfat." These foods may be higher in added sugar or refined carbs and should be avoided.  Talk to your dietitian to identify your daily goals for nutrients listed on the label. Shopping  Avoid buying canned, pre-made, or processed foods. These foods tend to be high in fat, sodium, and added sugar.  Shop around the outside edge of the grocery store. This is where you will most often find fresh fruits and vegetables, bulk grains, fresh meats, and fresh dairy. Cooking  Use low-heat cooking methods, such as baking, instead of high-heat cooking methods like deep frying.  Cook using healthy oils, such as olive, canola, or sunflower oil.  Avoid cooking with butter, cream, or high-fat meats. Meal planning  Eat meals and snacks regularly, preferably at the same times every day. Avoid going long periods of time without eating.  Eat foods that are high in fiber, such as fresh fruits, vegetables, beans, and whole grains. Talk with your dietitian about how many servings of carbs you can eat at each meal.  Eat 4-6 oz (112-168 g) of lean protein each day, such as lean meat, chicken, fish, eggs, or tofu. One ounce (oz) of lean protein is equal to: ? 1 oz (28 g) of meat, chicken, or fish. ? 1 egg. ?  cup (62 g) of tofu.  Eat some foods each day that contain healthy fats, such as avocado, nuts, seeds, and fish.   What foods should I eat? Fruits Berries. Apples. Oranges. Peaches. Apricots. Plums. Grapes. Mango. Papaya. Pomegranate. Kiwi. Cherries. Vegetables Lettuce. Spinach. Leafy greens, including kale, chard, collard greens, and mustard greens. Beets. Cauliflower. Cabbage. Broccoli. Carrots. Green beans. Tomatoes. Peppers. Onions.  Cucumbers. Brussels sprouts. Grains Whole grains, such as whole-wheat or whole-grain bread, crackers, tortillas, cereal, and pasta. Unsweetened oatmeal. Quinoa. Brown or wild rice. Meats and other proteins Seafood. Poultry without skin. Lean cuts of poultry and beef. Tofu. Nuts. Seeds. Dairy Low-fat or fat-free dairy products such as milk, yogurt, and cheese. The items listed above may not be a complete list of foods and beverages you can eat. Contact a dietitian for more information. What foods should I avoid? Fruits Fruits canned with syrup. Vegetables Canned  vegetables. Frozen vegetables with butter or cream sauce. Grains Refined white flour and flour products such as bread, pasta, snack foods, and cereals. Avoid all processed foods. Meats and other proteins Fatty cuts of meat. Poultry with skin. Breaded or fried meats. Processed meat. Avoid saturated fats. Dairy Full-fat yogurt, cheese, or milk. Beverages Sweetened drinks, such as soda or iced tea. The items listed above may not be a complete list of foods and beverages you should avoid. Contact a dietitian for more information. Questions to ask a health care provider  Do I need to meet with a diabetes educator?  Do I need to meet with a dietitian?  What number can I call if I have questions?  When are the best times to check my blood glucose? Where to find more information:  American Diabetes Association: diabetes.org  Academy of Nutrition and Dietetics: www.eatright.[employer] of Diabetes and Digestive and Kidney Diseases: [employer]  Association of Diabetes Care and Education Specialists: www.diabeteseducator.org Summary  It is important to have healthy eating habits because your blood sugar (glucose) levels are greatly affected by what you eat and drink.  A healthy meal plan will help you control your blood glucose and maintain a healthy lifestyle.  Your health care provider may recommend that  you work with a dietitian to make a meal plan that is best for you.  Keep in mind that carbohydrates (carbs) and alcohol have immediate effects on your blood glucose levels. It is important to count carbs and to use alcohol carefully. This information is not intended to replace advice given to you by your health care provider. Make sure you discuss any questions you have with your health care provider. Document Revised: 06/02/2019 Document Reviewed: 06/02/2019 Elsevier Patient Education  2021 [employer].

## 2020-12-13 NOTE — Progress Notes (Addendum)
Cardiology Office Note:    Date:  12/14/2020   ID:  Heather [name], DOB May 23, 1935, MRN [id number]  PCP:  Mosie Lukes, MD   [facility] Providers Cardiologist:  [name], MD Cardiology APP:  Sharmon Revere     Referring MD: Mosie Lukes, MD   Chief Complaint:  Follow-up (CAD)    Patient Profile:    Heather [name] is a 86 y.o. female with:   Coronary artery disease   Jaw pain=angingal equiv ? S/p Taxus DES to LCx 9/06 ? S/p Taxus DES to RCA 12/06 ? Residual dz 12/06: mLAD 70-80  Aortic atherosclerosis   Borderline diabetes mellitus   GERD   Hypertension   Hyperlipidemia   Hx of CVA  Hx of post op DVT (after TKR) in 2016; Rivaroxaban x 6 mos  Fatigue, palpitations 3/22  Echocardiogram 2/22: EF 55-60  Myoview 2/22: EF 67, low risk   Event monitor 3/22: no sig arrhythmia   Rx w/ beta-blocker   Prior CV studies: NON-TELEMETRY MONITORING HOOKUP AND INTERP 10/05/2020 Narrative 1. The basic rhythm is normal sinus with an average HR of 61 bpm 2. No atrial fibrillation or flutter 3. There are periods of marked sinus bradycardia, mostly nocturnal, but no high-grade heart block or pathologic pauses 4. There are rare PVC's and rare supraventricular beats without sustained arrhythmias  Myoview 08/31/20 No ischemia or scar, EF 67; low risk   Echocardiogram 08/26/20 EF 55-60, no RWMA, mild conc LVH, Gr 2 DD, GLS -21.7%, normal RVSF, trivial MR  Carotid US 11/17/15 Bilat ICA 1-39  ETT Echocardiogram 10/24/11 Normal   Myoview 08/03/09 EF 70, no scar or ischemia  Cardiac catheterization 06/27/05 LM 20 LAD mid 70-80; Dx ost 62 OM stent patent RCA mid 70-80 EF 55 PCI: Taxus DES to RCA  Myoview 04/25/05 No ischemia or scar, EF 70    History of Present Illness: Ms. Sackrider was last seen in 3/22.  I placed her on spironolactone to help with further diuresis as it seems she had some issues with volume excess.  Since last seen, she  had repair of vaginal prolapse.  She had some issues with urinary retention afterwards.  She has ultimately been seen by gastroenterology for generalized abdominal pain.  She was noted to have gallstones on imaging studies and has been referred to general surgery.  She returns for cardiology follow-up.  She is here with her daughter.  She continues to have right upper quadrant pain.  She sees the surgeon later this month.  She has not had any chest discomfort.  Her breathing had improved.  Since her recent surgery, she has felt more short of breath with some activities.  She sleeps on an incline secondary to acid reflux.  She does have a problem with her right ankle and sees her primary care doctor soon.  She thinks she injured it.  She has had some swelling in her legs with more on the right than the left.  She has not had any syncope.        Past Medical History:  Diagnosis Date  . Chronic stable angina ([place])   . Coronary artery disease cardiologist--- dr Burt Knack   hx NSTEMI  w/ cardiac cath 03-16-2005 PCI with DES to LCx;   cardiac cath 06-27-2005  PCI with DES to RCA with residual dx LAD manage medcially;  lexiscan 01-26-20211 normal no ischemia, ef 70%;  stress echo w/ dobutamine 10-24-2011 negative ishcmeia , normal ef //  Myoview 2/22: EF 67, no ischemia or infarction, no TID, low risk   . Diabetes mellitus type 2, diet-controlled ([place])    followed by pcp  (10-19-2020  pt stated checks daily in am,  fasting blood surgar--- 115--120s)  . DOE (dyspnea on exertion)    per pt "when I over do",  ok with household chores  . Echocardiogram 08/2020    Echocardiogram 2/22: EF 55-60, no RWMA, mild LVH, Gr 2 DD, GLS-21.7%, normal RVSF, trivial MR, RVSP 39.5  . Edema of right lower extremity   . GERD (gastroesophageal reflux disease)   . Hiatal hernia    recurrence,  hx HH repair 1989  . History of cervical cancer    s/p  vaginal hysterectomy  . History of DVT of lower extremity 2016   11-29-2014 post op  right TKA of right lower extremity and completed xarelto   . History of esophageal stricture    hx s/p dilatation's  . History of gastric ulcer 2005 approx.  Marland Kitchen History of palpitations 2010   event monitor 07-07-2009 showed NSR w/ freq. SVT ectopies with short runs, rare PVCs  . History of TIA (transient ischemic attack) 06/1999   12-15-2019  per pt had several TIA between 12/ 2000 to 02/ 2001 , was sent to specialist [facility]' , had test that was normal (10-19-2020 pt stated no TIAs since ) but has residual of essential tremors of right arm/ hand  . Hypertension   . Intermittent palpitations   . IT band syndrome   . Migraine    "ice pick headche lasts about 30 seconds"  . Mixed hyperlipidemia   . Mixed incontinence urge and stress   . Multiple thyroid nodules    followed by pcp---   ultrasound 11-22-2014 no bx   (12-15-2019 per pt had a endocrinologist and was told did not need bx)  . OA (osteoarthritis)    knees, elbow, hip, ankles  . Occasional tremors    right arm/ hand  s/p TIA residual 2000  . Osteoporosis    taking vitamin d  . Right bundle branch block (RBBB) with left anterior fascicular block (LAFB)   . RLS (restless legs syndrome)   . S/P drug eluting coronary stent placement 2006   03-16-2005  PCI x1 DES to LCx;   06-27-2005  PCI x1 DES to RCA  . Urinary retention    post op sling prodecure on 10-27-2020, has foley cathether    Current Medications: Current Meds  Medication Sig  . acetaminophen (TYLENOL) 500 MG tablet Take 1,000 mg by mouth every 6 (six) hours as needed.  Marland Kitchen amLODipine (NORVASC) 10 MG tablet Take 1 tablet (10 mg total) by mouth daily.  . Ascorbic Acid (VITAMIN C) 500 MG CHEW Chew by mouth daily.  Marland Kitchen aspirin EC 81 MG tablet Take 1 tablet (81 mg total) by mouth daily.  . Blood Glucose Monitoring Suppl (TRUE METRIX AIR GLUCOSE METER) w/Device KIT USE TO CHECK BLOOD SUGAR ONCE DAILY AND AS NEEDED.  DX CODE E11.9  . Cholecalciferol (VITAMIN D) 50 MCG (2000 UT)  CAPS Take 2,000 Units by mouth daily.   . famotidine (PEPCID) 20 MG tablet Take 20 mg by mouth at bedtime as needed for heartburn or indigestion.  Marland Kitchen glucose blood (TRUE METRIX BLOOD GLUCOSE TEST) test strip USE TO CHECK BLOOD SUGAR ONCE A DAY OR AS NEEDED.  DX CODE: E11.9  . hyoscyamine (LEVSIN SL) 0.125 MG SL tablet Place 1 tablet (0.125 mg total) under the tongue  every 4 (four) hours as needed.  Marland Kitchen ketorolac (TORADOL) 10 MG tablet Take 1 tablet (10 mg total) by mouth every 6 (six) hours as needed.  Marland Kitchen losartan (COZAAR) 100 MG tablet Take 1 tablet (100 mg total) by mouth daily.  . magnesium oxide (MAG-OX) 400 MG tablet Take 400 mg by mouth 2 (two) times daily.  . Multiple Vitamins-Minerals (ZINC PO) Take by mouth daily.  . nitroGLYCERIN (NITROSTAT) 0.4 MG SL tablet Place 1 tablet (0.4 mg total) under the tongue every 5 (five) minutes as needed for chest pain.  Marland Kitchen omeprazole (PRILOSEC) 20 MG capsule Take 1 capsule (20 mg total) by mouth daily as needed.  . ondansetron (ZOFRAN ODT) 4 MG disintegrating tablet Take 1 tablet (4 mg total) by mouth every 6 (six) hours as needed for nausea or vomiting.  . potassium chloride SA (KLOR-CON) 20 MEQ tablet Take 0.5 tablets (10 mEq total) by mouth daily.  . pramipexole (MIRAPEX) 0.25 MG tablet Take 1 tablet (0.25 mg total) by mouth 2 (two) times daily.  . promethazine (PHENERGAN) 12.5 MG tablet Take 1 tablet (12.5 mg total) by mouth every 8 (eight) hours as needed for nausea or vomiting.  . simvastatin (ZOCOR) 20 MG tablet TAKE 1 TABLET AT BEDTIME  . spironolactone (ALDACTONE) 25 MG tablet Take 0.5 tablets (12.5 mg total) by mouth 3 (three) times a week.  . traMADol (ULTRAM) 50 MG tablet Take 50 mg by mouth every 12 (twelve) hours as needed for severe pain.  . TRUEplus Lancets 33G MISC USE TO CHECK BLOOD SUGAR ONCE DAILY AND AS NEEDED.  DX CODE: E11.9  . [DISCONTINUED] isosorbide mononitrate (IMDUR) 60 MG 24 hr tablet Take 1 tablet (60 mg total) by mouth daily.   . [DISCONTINUED] metoprolol succinate (TOPROL-XL) 50 MG 24 hr tablet Take 1.5 tablets (75 mg total) by mouth at bedtime. Take with or immediately following a meal.     Allergies:   Baclofen and Nitrofurantoin   Social History   Tobacco Use  . Smoking status: Never Smoker  . Smokeless tobacco: Never Used  Vaping Use  . Vaping Use: Never used  Substance Use Topics  . Alcohol use: No    Alcohol/week: 0.0 standard drinks  . Drug use: Never     Family Hx: The patient's family history includes Arthritis in an other family member; Cancer in her brother; Coronary artery disease in her mother; Depression in her brother; Diabetes in her brother and daughter; Heart disease in her mother; Hypertension in her daughter and another family member; Other in an other family member; Stroke in her brother and father; Thyroid disease in her daughter. There is no history of Breast cancer, Colon cancer, Anesthesia problems, Hypotension, Malignant hyperthermia, Pseudochol deficiency, Colon polyps, Esophageal cancer, Rectal cancer, or Stomach cancer.  ROS   EKGs/Labs/Other Test Reviewed:    EKG:  EKG is not ordered today.  The ekg ordered today demonstrates n/a  Recent Labs: 07/14/2020: TSH 2.07 08/19/2020: Magnesium 1.7; NT-Pro BNP 428 11/24/2020: ALT 17; BUN 22; Creatinine, Ser 0.89; Hemoglobin 13.1; Platelets 187; Potassium 3.9; Sodium 137   Recent Lipid Panel Lab Results  Component Value Date/Time   CHOL 150 07/14/2020 09:21 AM   TRIG 117.0 07/14/2020 09:21 AM   HDL 62.10 07/14/2020 09:21 AM   LDLCALC 65 07/14/2020 09:21 AM   LDLDIRECT 73.4 05/20/2014 04:51 PM      Risk Assessment/Calculations:      Physical Exam:    VS:  BP (!) 156/60   Pulse Marland Kitchen)  59   Ht '5\' 2"'  (1.575 m)   Wt 159 lb 9.6 oz (72.4 kg)   LMP  (LMP Unknown)   SpO2 96%   BMI 29.19 kg/m     Wt Readings from Last 3 Encounters:  12/14/20 159 lb 9.6 oz (72.4 kg)  12/01/20 157 lb (71.2 kg)  11/24/20 160 lb (72.6 kg)      Constitutional:      Appearance: Healthy appearance. Not in distress.  Neck:     Vascular: JVD normal.  Pulmonary:     Effort: Pulmonary effort is normal.     Breath sounds: No wheezing. No rales.  Cardiovascular:     Normal rate. Regular rhythm. Normal S1. Normal S2.     Murmurs: There is no murmur.  Edema:    Ankle: bilateral trace edema of the ankle.    Comments: Right greater than left Abdominal:     Palpations: Abdomen is soft.  Skin:    General: Skin is warm and dry.  Neurological:     Mental Status: Alert and oriented to person, place and time.     Cranial Nerves: Cranial nerves are intact.          ASSESSMENT & PLAN:    1. Coronary artery disease involving native coronary artery of native heart without angina pectoris History of DES to the LCx and RCA in 2006.  Recent Myoview low risk.  She is not currently having anginal symptoms.  Continue current dose of amlodipine, aspirin, metoprolol succinate.  I will adjust her isosorbide for blood pressure as outlined below.  2. Essential hypertension She needs better blood pressure control.  She is already on max dose amlodipin and losartan.  Her heart rate will not tolerate further increases in her metoprolol succinate.  Therefore, increase isosorbide to 90 mg daily.  If she does not have good blood pressure control with this, we consider changing losartan to a more potent ARB versus adding hydralazine.  3. Mixed hyperlipidemia LDL optimal on most recent lab work.  Continue current Rx.    4. Palpitations Overall well controlled with current dose of beta-blocker.  Continue current management.  5. Preoperative cardiovascular examination She sees general surgery later this month.  She has gallstones.  It sounds like she may be needing a cholecystectomy.  Her perioperative risk of major cardiac event is high according to the revised cardiac risk index at 11%.  However, she had a recent nuclear stress test which was low risk.   She can achieve 4 METS and is not having unstable symptoms.  Therefore, she can proceed with gallbladder surgery if needed at acceptable risk.  Ideally, she should remain on aspirin without interruption.  However, if the bleeding risk is too great, her aspirin could be held for 5-7 days and then resumed postop as soon as it is felt to be safe.    Dispo:  Return in about 6 months (around 06/15/2021) for Routine follw up in 6 month with Dr. Burt Knack. .   Medication Adjustments/Labs and Tests Ordered: Current medicines are reviewed at length with the patient today.  Concerns regarding medicines are outlined above.  Tests Ordered: No orders of the defined types were placed in this encounter.  Medication Changes: Meds ordered this encounter  Medications  . metoprolol succinate (TOPROL-XL) 50 MG 24 hr tablet    Sig: Take 1.5 tablets (75 mg total) by mouth at bedtime. Take with or immediately following a meal.    Dispense:  135 tablet  Refill:  3  . isosorbide mononitrate (IMDUR) 60 MG 24 hr tablet    Sig: Take 1.5 tablets (90 mg total) by mouth daily.    Dispense:  135 tablet    Refill:  3    Signed, [name], PA-C  12/14/2020 6:00 PM    [facility] [address], [place], [place]  [address] Phone: [phone]; Fax: [phone]

## 2020-12-14 ENCOUNTER — Other Ambulatory Visit: Payer: Self-pay

## 2020-12-14 ENCOUNTER — Encounter: Payer: Self-pay | Admitting: Physician Assistant

## 2020-12-14 ENCOUNTER — Ambulatory Visit (INDEPENDENT_AMBULATORY_CARE_PROVIDER_SITE_OTHER): Payer: Medicare HMO | Admitting: Physician Assistant

## 2020-12-14 VITALS — BP 156/60 | HR 59 | Ht 62.0 in | Wt 159.6 lb

## 2020-12-14 DIAGNOSIS — M25571 Pain in right ankle and joints of right foot: Secondary | ICD-10-CM | POA: Diagnosis not present

## 2020-12-14 DIAGNOSIS — Z01810 Encounter for preprocedural cardiovascular examination: Secondary | ICD-10-CM

## 2020-12-14 DIAGNOSIS — R002 Palpitations: Secondary | ICD-10-CM | POA: Diagnosis not present

## 2020-12-14 DIAGNOSIS — I10 Essential (primary) hypertension: Secondary | ICD-10-CM

## 2020-12-14 DIAGNOSIS — I2510 Atherosclerotic heart disease of native coronary artery without angina pectoris: Secondary | ICD-10-CM

## 2020-12-14 DIAGNOSIS — E782 Mixed hyperlipidemia: Secondary | ICD-10-CM

## 2020-12-14 MED ORDER — METOPROLOL SUCCINATE ER 50 MG PO TB24: 75.0000 mg | ORAL_TABLET | Freq: Every day | ORAL | 3 refills | Status: DC

## 2020-12-14 MED ORDER — ISOSORBIDE MONONITRATE ER 60 MG PO TB24: 90.0000 mg | ORAL_TABLET | Freq: Every day | ORAL | 3 refills | Status: DC

## 2020-12-14 NOTE — Patient Instructions (Signed)
Medication Instructions:   INCREASE IMDUR ONE TABLET AND ONE HALF BY MOUTH (90 MG) DAILY.    *If you need a refill on your cardiac medications before your next appointment, please call your pharmacy*   Lab Work: NONE  If you have labs (blood work) drawn today and your tests are completely normal, you will receive your results only by: Marland Kitchen MyChart Message (if you have MyChart) OR . A paper copy in the mail If you have any lab test that is abnormal or we need to change your treatment, we will call you to review the results.   Testing/Procedures: NONE   Follow-Up: At [facility], you and your health needs are our priority.  As part of our continuing mission to provide you with exceptional heart care, we have created designated Provider Care Teams.  These Care Teams include your primary Cardiologist (physician) and Advanced Practice Providers (APPs -  Physician Assistants and Nurse Practitioners) who all work together to provide you with the care you need, when you need it.  We recommend signing up for the patient portal called "MyChart".  Sign up information is provided on this After Visit Summary.  MyChart is used to connect with patients for Virtual Visits (Telemedicine).  Patients are able to view lab/test results, encounter notes, upcoming appointments, etc.  Non-urgent messages can be sent to your provider as well.   To learn more about what you can do with MyChart, go to [email or web address].    Your next appointment:   6 month(s) Wednesday, December 7 @ 9:00 am.    The format for your next appointment:   In Person  Provider:   [name], MD   Other Instructions  PLEASE [place], [place].

## 2020-12-22 ENCOUNTER — Encounter (HOSPITAL_BASED_OUTPATIENT_CLINIC_OR_DEPARTMENT_OTHER): Payer: Self-pay | Admitting: *Deleted

## 2020-12-22 ENCOUNTER — Other Ambulatory Visit: Payer: Self-pay

## 2020-12-22 ENCOUNTER — Emergency Department (HOSPITAL_BASED_OUTPATIENT_CLINIC_OR_DEPARTMENT_OTHER)
Admission: EM | Admit: 2020-12-22 | Discharge: 2020-12-22 | Disposition: A | Discharge status: Home / Self Care | Payer: Medicare HMO | Attending: Emergency Medicine | Admitting: Emergency Medicine

## 2020-12-22 ENCOUNTER — Emergency Department (HOSPITAL_BASED_OUTPATIENT_CLINIC_OR_DEPARTMENT_OTHER): Payer: Medicare HMO | Admitting: Radiology

## 2020-12-22 DIAGNOSIS — Z79899 Other long term (current) drug therapy: Secondary | ICD-10-CM | POA: Insufficient documentation

## 2020-12-22 DIAGNOSIS — E1136 Type 2 diabetes mellitus with diabetic cataract: Secondary | ICD-10-CM | POA: Insufficient documentation

## 2020-12-22 DIAGNOSIS — Z96653 Presence of artificial knee joint, bilateral: Secondary | ICD-10-CM | POA: Diagnosis not present

## 2020-12-22 DIAGNOSIS — E1169 Type 2 diabetes mellitus with other specified complication: Secondary | ICD-10-CM | POA: Diagnosis not present

## 2020-12-22 DIAGNOSIS — E785 Hyperlipidemia, unspecified: Secondary | ICD-10-CM | POA: Diagnosis not present

## 2020-12-22 DIAGNOSIS — Z7982 Long term (current) use of aspirin: Secondary | ICD-10-CM | POA: Insufficient documentation

## 2020-12-22 DIAGNOSIS — Z955 Presence of coronary angioplasty implant and graft: Secondary | ICD-10-CM | POA: Diagnosis not present

## 2020-12-22 DIAGNOSIS — Z86718 Personal history of other venous thrombosis and embolism: Secondary | ICD-10-CM | POA: Diagnosis not present

## 2020-12-22 DIAGNOSIS — Z8541 Personal history of malignant neoplasm of cervix uteri: Secondary | ICD-10-CM | POA: Insufficient documentation

## 2020-12-22 DIAGNOSIS — I2510 Atherosclerotic heart disease of native coronary artery without angina pectoris: Secondary | ICD-10-CM | POA: Insufficient documentation

## 2020-12-22 DIAGNOSIS — M79662 Pain in left lower leg: Secondary | ICD-10-CM

## 2020-12-22 MED ORDER — ACETAMINOPHEN 500 MG PO TABS
1000.0000 mg | ORAL_TABLET | Freq: Once | ORAL | Status: AC
  Administered 2020-12-22: 1000 mg via ORAL
  Filled 2020-12-22: qty 2

## 2020-12-22 NOTE — ED Triage Notes (Signed)
Woke up yesterday morning with pain to her left lower leg close to the ankle.  Denies any injuries.

## 2020-12-22 NOTE — ED Notes (Signed)
Bilateral Pedal Pulses palatable

## 2020-12-22 NOTE — ED Provider Notes (Signed)
[facility] DEPT Provider Note   CSN: [id number] Arrival date & time: 12/22/20  1119     History Chief Complaint  Patient presents with   Leg Pain    Heather [name] is a 86 y.o. female.  86 year old female with past medical history below who presents with left lower leg pain.  Patient woke up yesterday morning with atraumatic pain in her left lower leg at the bottom of her shin.  She does not recall hitting it.  She notices the pain more when she dorsiflexes her foot and ambulates.  She has not noticed any swelling or skin changes.  No previous injury to this area.  No fevers or recent illness.  No calf or posterior leg pain.  The history is provided by the patient.  Leg Pain     Past Medical History:  Diagnosis Date   Chronic stable angina [facility])    Coronary artery disease cardiologist--- dr Burt Knack   hx NSTEMI  w/ cardiac cath 03-16-2005 PCI with DES to LCx;   cardiac cath 06-27-2005  PCI with DES to RCA with residual dx LAD manage medcially;  lexiscan 01-26-20211 normal no ischemia, ef 70%;  stress echo w/ dobutamine 10-24-2011 negative ishcmeia , normal ef // Myoview 2/22: EF 67, no ischemia or infarction, no TID, low risk    Diabetes mellitus type 2, diet-controlled ([place])    followed by pcp  (10-19-2020  pt stated checks daily in am,  fasting blood surgar--- 115--120s)   DOE (dyspnea on exertion)    per pt "when I over do",  ok with household chores   Echocardiogram 08/2020    Echocardiogram 2/22: EF 55-60, no RWMA, mild LVH, Gr 2 DD, GLS-21.7%, normal RVSF, trivial MR, RVSP 39.5   Edema of right lower extremity    GERD (gastroesophageal reflux disease)    Hiatal hernia    recurrence,  hx HH repair 1989   History of cervical cancer    s/p  vaginal hysterectomy   History of DVT of lower extremity 2016   11-29-2014 post op right TKA of right lower extremity and completed xarelto    History of esophageal stricture    hx s/p dilatation's   History of  gastric ulcer 2005 approx.   History of palpitations 2010   event monitor 07-07-2009 showed NSR w/ freq. SVT ectopies with short runs, rare PVCs   History of TIA (transient ischemic attack) 06/1999   12-15-2019  per pt had several TIA between 12/ 2000 to 02/ 2001 , was sent to specialist _0 , had test that was normal (10-19-2020 pt stated no TIAs since ) but has residual of essential tremors of right arm/ hand   Hypertension    Intermittent palpitations    IT band syndrome    Migraine    "ice pick headche lasts about 30 seconds"   Mixed hyperlipidemia    Mixed incontinence urge and stress    Multiple thyroid nodules    followed by pcp---   ultrasound 11-22-2014 no bx   (12-15-2019 per pt had a endocrinologist and was told did not need bx)   OA (osteoarthritis)    knees, elbow, hip, ankles   Occasional tremors    right arm/ hand  s/p TIA residual 2000   Osteoporosis    taking vitamin d   Right bundle branch block (RBBB) with left anterior fascicular block (LAFB)    RLS (restless legs syndrome)    S/P drug eluting coronary stent placement 2006  03-16-2005  PCI x1 DES to LCx;   06-27-2005  PCI x1 DES to RCA   Urinary retention    post op sling prodecure on 10-27-2020, has foley cathether    Patient Active Problem List   Diagnosis Date Noted   RUQ pain 12/01/2020   Calculus of gallbladder with cholecystitis without biliary obstruction 12/01/2020   Low back pain radiating to right leg 07/14/2020   Pelvic relaxation due to rectocele 04/06/2020   Prolapse of female pelvic organs 12/22/2019   Pelvic relaxation 12/22/2019   Right hip pain 10/13/2019   Left foot pain 03/08/2019   Pelvic prolapse 03/08/2019   Pain of breast 01/29/2019   Overactive bladder 01/29/2019   Educated about COVID-19 virus infection 12/02/2018   Hyperlipidemia associated with type 2 diabetes mellitus ([place]) 05/27/2017   Right shoulder pain 04/29/2015   Diarrhea 04/17/2015   Headache 04/17/2015    Multinodular goiter 01/17/2015   Peroneal DVT (deep venous thrombosis) ([place]) 12/22/2014   S/P total knee arthroplasty 11/29/2014   Arthritis of knee, degenerative 11/19/2014   Right knee pain 11/19/2014   Neck pain 10/29/2014   Insomnia 02/05/2014   Preventative health care 11/22/2013   RLS (restless legs syndrome) 10/04/2013   Lower urinary tract infectious disease 10/04/2013   Cataracts, bilateral 04/02/2013   Leg swelling 01/17/2012   Hypokalemia 01/05/2012   Anemia 01/05/2012   Depression with anxiety 01/05/2012   Postoperative anemia due to acute blood loss 11/15/2011   Staphylococcus aureus carrier 11/15/2011   Pre-syncope 10/13/2011   DM (diabetes mellitus) ([place]) 10/06/2011   Pulmonary nodule 10/06/2011   Epigastric pain 10/06/2011   DJD (degenerative joint disease) of knee 09/18/2011   It band syndrome, right 08/28/2011   Renal insufficiency 08/28/2011   CORONARY ATHEROSCLEROSIS NATIVE CORONARY ARTERY 03/07/2010   Coronary atherosclerosis 01/18/2009   FATIGUE 01/18/2009   PERSISTENT DISORDER INITIATING/MAINTAINING SLEEP 09/09/2008   DERMATITIS 10/16/2007   PERIPHERAL EDEMA 10/16/2007   PALPITATIONS 10/16/2007   Essential hypertension 04/14/2007   GERD 04/14/2007   TRANSIENT ISCHEMIC ATTACK, HX OF 04/14/2007    Past Surgical History:  Procedure Laterality Date   ANTERIOR AND POSTERIOR REPAIR N/A 12/22/2019   Procedure: ANTERIOR (CYSTOCELE)  REPAIR;  Surgeon: Janyth Contes, MD;  Location: [facility];  Service: Gynecology;  Laterality: N/A;   ANTERIOR AND POSTERIOR REPAIR WITH SACROSPINOUS FIXATION N/A 10/27/2020   Procedure: SACROSPINOUS LIGAMENT FIXATION;  Surgeon: Jaquita Folds, MD;  Location: [facility];  Service: Gynecology;  Laterality: N/A;   BLADDER SUSPENSION N/A 10/27/2020   Procedure: TRANSVAGINAL TAPE (TVT) PROCEDURE;  Surgeon: Jaquita Folds, MD;  Location: [facility];  Service:  Gynecology;  Laterality: N/A;   CATARACT EXTRACTION W/ INTRAOCULAR LENS  IMPLANT, BILATERAL  2015   COLONOSCOPY  last one ?   CORONARY ANGIOPLASTY WITH STENT PLACEMENT  03-16-2005   dr Lia Foyer   PCI and DES x1 to LCx   CORONARY ANGIOPLASTY WITH STENT PLACEMENT  06-27-2005  dr Lia Foyer   PCI and DES x1 to RCA with residual disease LAD 70-80% to manage medically   CYSTOSCOPY N/A 10/27/2020   Procedure: CYSTOSCOPY;  Surgeon: Jaquita Folds, MD;  Location: [facility];  Service: Gynecology;  Laterality: N/A;   CYSTOSCOPY N/A 11/09/2020   Procedure: CYSTOSCOPY;  Surgeon: Jaquita Folds, MD;  Location: [facility];  Service: Gynecology;  Laterality: N/A;   FOOT SURGERY Left 1990s   left foot stress fracture repair, per pt no  hardware   HIATAL HERNIA REPAIR  1989   KNEE ARTHROSCOPY Bilateral right ?/   left x2 , last one 09-12-2009 @ [facility]   PUBOVAGINAL SLING N/A 11/09/2020   Procedure: [place];  Surgeon: Jaquita Folds, MD;  Location: [facility];  Service: Gynecology;  Laterality: N/A;   RECTOCELE REPAIR N/A 04/20/2020   Procedure: POSTERIOR REPAIR (RECTOCELE);  Surgeon: Janyth Contes, MD;  Location: [facility];  Service: Gynecology;  Laterality: N/A;   RECTOCELE REPAIR N/A 10/27/2020   Procedure: POSTERIOR REPAIR (RECTOCELE);  Surgeon: Jaquita Folds, MD;  Location[facility];  Service: Gynecology;  Laterality: N/A;  total time requested for all procedures is 2 hours   TOTAL KNEE ARTHROPLASTY  11/12/2011   Procedure: TOTAL KNEE ARTHROPLASTY;  Surgeon: Lorn Junes, MD;  Location: [facility];  Service: Orthopedics;  Laterality: Left;  Dr Noemi Chapel wants 90 minutes for this case   TOTAL KNEE ARTHROPLASTY Right 11/29/2014   Procedure: RIGHT TOTAL KNEE ARTHROPLASTY;  Surgeon: Vickey Huger, MD;  Location: [facility];  Service: Orthopedics;  Laterality: Right;   UPPER GASTROINTESTINAL  ENDOSCOPY  last one 04-25-2017   with dilatation esophageal stricture and savary dilatation   VAGINAL HYSTERECTOMY  1988    no ovaries removed for bleeeding     OB History     Gravida  4   Para  1   Term      Preterm  1   AB      Living  4      SAB      IAB      Ectopic      Multiple      Live Births              Family History  Problem Relation Age of Onset   Stroke Father        family hx of M 1st degree relative <50   Coronary artery disease Mother    Heart disease Mother    Depression Brother    Stroke Brother    Diabetes Brother    Cancer Brother        bladder with mets   Diabetes Daughter        borderline   Hypertension Daughter    Arthritis Other        family hx of   Hypertension Other        family hx of   Other Other        family hx of cardiovascular disorder   Thyroid disease Daughter    Breast cancer Neg Hx    Colon cancer Neg Hx    Anesthesia problems Neg Hx    Hypotension Neg Hx    Malignant hyperthermia Neg Hx    Pseudochol deficiency Neg Hx    Colon polyps Neg Hx    Esophageal cancer Neg Hx    Rectal cancer Neg Hx    Stomach cancer Neg Hx     Social History   Tobacco Use   Smoking status: Never   Smokeless tobacco: Never  Vaping Use   Vaping Use: Never used  Substance Use Topics   Alcohol use: No    Alcohol/week: 0.0 standard drinks   Drug use: Never    Home Medications Prior to Admission medications   Medication Sig Start Date End Date Taking? Authorizing Provider  amLODipine (NORVASC) 10 MG tablet Take 1 tablet (10 mg total) by mouth daily. 09/06/20  Yes Mosie Lukes, MD  Ascorbic Acid (VITAMIN C) 500 MG CHEW Chew by mouth daily.   Yes [provider]  aspirin EC 81 MG tablet Take 1 tablet (81 mg total) by mouth daily. 05/04/15  Yes [name], MD  Cholecalciferol (VITAMIN D) 50 MCG (2000 UT) CAPS Take 2,000 Units by mouth daily.    Yes [provider]  famotidine (PEPCID) 20 MG  tablet Take 20 mg by mouth at bedtime as needed for heartburn or indigestion.   Yes [provider]  isosorbide mononitrate (IMDUR) 60 MG 24 hr tablet Take 1.5 tablets (90 mg total) by mouth daily. 12/14/20 12/14/21 Yes Weaver, Scott T, PA-C  losartan (COZAAR) 100 MG tablet Take 1 tablet (100 mg total) by mouth daily. 10/31/20  Yes Mosie Lukes, MD  magnesium oxide (MAG-OX) 400 MG tablet Take 400 mg by mouth 2 (two) times daily.   Yes [provider]  metoprolol succinate (TOPROL-XL) 50 MG 24 hr tablet Take 1.5 tablets (75 mg total) by mouth at bedtime. Take with or immediately following a meal. 12/14/20 12/14/21 Yes Weaver, Scott T, PA-C  Multiple Vitamins-Minerals (ZINC PO) Take by mouth daily.   Yes [provider]  omeprazole (PRILOSEC) 20 MG capsule Take 1 capsule (20 mg total) by mouth daily as needed. 03/01/20  Yes Mosie Lukes, MD  potassium chloride SA (KLOR-CON) 20 MEQ tablet Take 0.5 tablets (10 mEq total) by mouth daily. 09/14/20  Yes Weaver, Scott T, PA-C  pramipexole (MIRAPEX) 0.25 MG tablet Take 1 tablet (0.25 mg total) by mouth 2 (two) times daily. 09/26/20  Yes Mosie Lukes, MD  simvastatin (ZOCOR) 20 MG tablet TAKE 1 TABLET AT BEDTIME 09/01/20  Yes [name] T, PA-C  spironolactone (ALDACTONE) 25 MG tablet Take 0.5 tablets (12.5 mg total) by mouth 3 (three) times a week. 09/30/20  Yes Weaver, Scott T, PA-C  acetaminophen (TYLENOL) 500 MG tablet Take 1,000 mg by mouth every 6 (six) hours as needed.    [provider]  Blood Glucose Monitoring Suppl (TRUE METRIX AIR GLUCOSE METER) w/Device KIT USE TO CHECK BLOOD SUGAR ONCE DAILY AND AS NEEDED.  DX CODE E11.9 07/27/20   Mosie Lukes, MD  glucose blood (TRUE METRIX BLOOD GLUCOSE TEST) test strip USE TO CHECK BLOOD SUGAR ONCE A DAY OR AS NEEDED.  DX CODE: E11.9 07/27/20   Mosie Lukes, MD  hyoscyamine (LEVSIN SL) 0.125 MG SL tablet Place 1 tablet (0.125 mg total) under the tongue every 4 (four) hours  as needed. 03/06/19   Mosie Lukes, MD  ketorolac (TORADOL) 10 MG tablet Take 1 tablet (10 mg total) by mouth every 6 (six) hours as needed. 10/30/20   Jaquita Folds, MD  nitroGLYCERIN (NITROSTAT) 0.4 MG SL tablet Place 1 tablet (0.4 mg total) under the tongue every 5 (five) minutes as needed for chest pain. 08/29/20 08/29/21  [name] T, PA-C  ondansetron (ZOFRAN ODT) 4 MG disintegrating tablet Take 1 tablet (4 mg total) by mouth every 6 (six) hours as needed for nausea or vomiting. 12/15/19   Armbruster, Carlota Raspberry, MD  promethazine (PHENERGAN) 12.5 MG tablet Take 1 tablet (12.5 mg total) by mouth every 8 (eight) hours as needed for nausea or vomiting. 10/30/20   Jaquita Folds, MD  traMADol (ULTRAM) 50 MG tablet Take 50 mg by mouth every 12 (twelve) hours as needed for severe pain.    [provider]  TRUEplus Lancets 33G MISC USE  TO CHECK BLOOD SUGAR ONCE DAILY AND AS NEEDED.  DX CODE: E11.9 07/27/20   Mosie Lukes, MD    Allergies    Baclofen and Nitrofurantoin  Review of Systems   Review of Systems  Physical Exam Updated Vital Signs BP (!) 154/63 (BP Location: Right Arm)   Pulse (!) 57   Temp 98 F (36.7 C) (Oral)   Resp 16   Ht _0  (1.575 m)   Wt 72.6 kg   LMP  (LMP Unknown)   SpO2 98%   BMI 29.26 kg/m   Physical Exam Vitals and nursing note reviewed.  Constitutional:      General: She is not in acute distress.    Appearance: She is well-developed.  HENT:     Head: Normocephalic and atraumatic.  Eyes:     Conjunctiva/sclera: Conjunctivae normal.  Cardiovascular:     Pulses: Normal pulses.  Musculoskeletal:        General: No swelling.     Cervical back: Neck supple.       Legs:     Comments: Mild tenderness over distal L tibia near ankle without swelling or skin changes, no calf pain or medial/lateral malleolus tenderness, no dorsal foot pain  Skin:    General: Skin is warm and dry.  Neurological:     Mental Status: She is alert and  oriented to person, place, and time.     Sensory: No sensory deficit.  Psychiatric:        Judgment: Judgment normal.    ED Results / Procedures / Treatments   Labs (all labs ordered are listed, but only abnormal results are displayed) Labs Reviewed - No data to display  EKG None  Radiology DG Tibia/Fibula Left  Result Date: 12/22/2020 CLINICAL DATA:  Lower leg pain.  No injury EXAM: LEFT TIBIA AND FIBULA - 2 VIEW COMPARISON:  None. FINDINGS: Total knee replacement in satisfactory position and alignment. No loosening or fracture. No acute fracture or bone lesion. Irregularity of the medial malleolus appears to be due to chronic injury. Scattered phleboliths in the soft tissues. Calcaneal spurring on the plantar surface. IMPRESSION: No acute abnormality. Satisfactory appearance of left knee replacement. Electronically Signed   By: Franchot Gallo M.D.   On: 12/22/2020 12:38    Procedures Procedures   Medications Ordered in ED Medications  acetaminophen (TYLENOL) tablet 1,000 mg (1,000 mg Oral Given 12/22/20 1224)    ED Course  I have reviewed the triage vital signs and the nursing notes.  Pertinent imaging results that were available during my care of the patient were reviewed by me and considered in my medical decision making (see chart for details).    MDM Rules/Calculators/A&P                          X-rays negative acute.  No skin changes to suggest cellulitis.  Pain is directly over bone, no soft tissue pain posteriorly to suggest DVT.  Have discussed further measures including ice, elevation, and Tylenol as needed.  Recommended PCP follow-up versus sports medicine follow-up if pain persists.  Reviewed return precautions. Final Clinical Impression(s) / ED Diagnoses Final diagnoses:  Pain in left lower leg    Rx / DC Orders ED Discharge Orders     None        Chrysta Fulcher, Wenda Overland, MD 12/22/20 1626

## 2020-12-31 ENCOUNTER — Other Ambulatory Visit: Payer: Self-pay | Admitting: Family Medicine

## 2021-01-04 ENCOUNTER — Ambulatory Visit (INDEPENDENT_AMBULATORY_CARE_PROVIDER_SITE_OTHER): Payer: Medicare HMO | Admitting: Obstetrics & Gynecology

## 2021-01-04 ENCOUNTER — Other Ambulatory Visit: Payer: Self-pay

## 2021-01-04 ENCOUNTER — Ambulatory Visit: Payer: Self-pay | Admitting: Surgery

## 2021-01-04 VITALS — BP 146/72 | Wt 159.8 lb

## 2021-01-04 DIAGNOSIS — N8189 Other female genital prolapse: Secondary | ICD-10-CM

## 2021-01-04 DIAGNOSIS — K8010 Calculus of gallbladder with chronic cholecystitis without obstruction: Secondary | ICD-10-CM | POA: Diagnosis not present

## 2021-01-04 DIAGNOSIS — K432 Incisional hernia without obstruction or gangrene: Secondary | ICD-10-CM | POA: Diagnosis not present

## 2021-01-04 DIAGNOSIS — R3915 Urgency of urination: Secondary | ICD-10-CM

## 2021-01-04 DIAGNOSIS — K449 Diaphragmatic hernia without obstruction or gangrene: Secondary | ICD-10-CM | POA: Diagnosis not present

## 2021-01-04 DIAGNOSIS — I2510 Atherosclerotic heart disease of native coronary artery without angina pectoris: Secondary | ICD-10-CM | POA: Diagnosis not present

## 2021-01-04 DIAGNOSIS — N3941 Urge incontinence: Secondary | ICD-10-CM

## 2021-01-04 DIAGNOSIS — H9190 Unspecified hearing loss, unspecified ear: Secondary | ICD-10-CM | POA: Diagnosis not present

## 2021-01-04 DIAGNOSIS — N819 Female genital prolapse, unspecified: Secondary | ICD-10-CM | POA: Diagnosis not present

## 2021-01-04 DIAGNOSIS — K219 Gastro-esophageal reflux disease without esophagitis: Secondary | ICD-10-CM | POA: Diagnosis not present

## 2021-01-04 NOTE — Progress Notes (Signed)
GYNECOLOGY  VISIT  CC:   post op recheck, discuss continued issues with urination  HPI: 86 y.o. [id number] Widowed White or Caucasian female here for recheck after undergoing posterior repair and TVT placement followed by release of sling on 4/21 and 5/4 this year with Dr. Wannetta Sender.  Discharge has stopped.  Denies vaginal bleeding.  Is not feeling a bulge.  She is having issues with urination, however.  Can go, at time, hours without voiding and not have any leakage.  Then, other times, she will feel the need to void multiple times a day.  Not sure she feels she is emptying.  Denies dysuria.  Does get up at night and must wear a pad because sometimes fully losses urine and will soak what she is wearing.  Pt has undergone 4 surgeries in last year or so due to prolapse/incontinence.  Is going to have gall bladder removed soon.  Having to have cardiac clearance.  Has been on myrbetriq in the past and this did help some of her bladder symptoms.  Not sure she wants to resume this.  Is going to need follow up with Dr. Wannetta Sender.  Acupuncture discussed as well.  Aware this treatment is for 12 weeks.  She will need to know when gall bladder surgery is scheduled before determining when could start acupuncture, if this is what she decides she needs.  Will let us know if will be a couple of months as could start acupuncture.   MEDS:   Current Outpatient Medications on File Prior to Visit  Medication Sig Dispense Refill   acetaminophen (TYLENOL) 500 MG tablet Take 1,000 mg by mouth every 6 (six) hours as needed.     amLODipine (NORVASC) 10 MG tablet Take 1 tablet (10 mg total) by mouth daily. 90 tablet 1   Ascorbic Acid (VITAMIN C) 500 MG CHEW Chew by mouth daily.     aspirin EC 81 MG tablet Take 1 tablet (81 mg total) by mouth daily.     Blood Glucose Monitoring Suppl (TRUE METRIX AIR GLUCOSE METER) w/Device KIT USE TO CHECK BLOOD SUGAR ONCE DAILY AND AS NEEDED.  DX CODE E11.9 1 kit 0   Cholecalciferol (VITAMIN D)  50 MCG (2000 UT) CAPS Take 2,000 Units by mouth daily.      famotidine (PEPCID) 20 MG tablet Take 20 mg by mouth at bedtime as needed for heartburn or indigestion.     glucose blood (TRUE METRIX BLOOD GLUCOSE TEST) test strip USE TO CHECK BLOOD SUGAR ONCE A DAY OR AS NEEDED.  DX CODE: E11.9 200 each 1   hyoscyamine (LEVSIN SL) 0.125 MG SL tablet Place 1 tablet (0.125 mg total) under the tongue every 4 (four) hours as needed. 30 tablet 0   isosorbide mononitrate (IMDUR) 60 MG 24 hr tablet Take 1.5 tablets (90 mg total) by mouth daily. 135 tablet 3   ketorolac (TORADOL) 10 MG tablet Take 1 tablet (10 mg total) by mouth every 6 (six) hours as needed. 20 tablet 0   losartan (COZAAR) 100 MG tablet Take 1 tablet (100 mg total) by mouth daily. 90 tablet 0   magnesium oxide (MAG-OX) 400 MG tablet Take 400 mg by mouth 2 (two) times daily.     metoprolol succinate (TOPROL-XL) 50 MG 24 hr tablet Take 1.5 tablets (75 mg total) by mouth at bedtime. Take with or immediately following a meal. 135 tablet 3   Multiple Vitamins-Minerals (ZINC PO) Take by mouth daily.     nitroGLYCERIN (NITROSTAT)  0.4 MG SL tablet Place 1 tablet (0.4 mg total) under the tongue every 5 (five) minutes as needed for chest pain. 25 tablet 11   omeprazole (PRILOSEC) 20 MG capsule TAKE 1 CAPSULE EVERY DAY AS NEEDED 90 capsule 0   ondansetron (ZOFRAN ODT) 4 MG disintegrating tablet Take 1 tablet (4 mg total) by mouth every 6 (six) hours as needed for nausea or vomiting. 30 tablet 0   potassium chloride SA (KLOR-CON) 20 MEQ tablet Take 0.5 tablets (10 mEq total) by mouth daily. 45 tablet 2   pramipexole (MIRAPEX) 0.25 MG tablet Take 1 tablet (0.25 mg total) by mouth 2 (two) times daily. 180 tablet 1   promethazine (PHENERGAN) 12.5 MG tablet Take 1 tablet (12.5 mg total) by mouth every 8 (eight) hours as needed for nausea or vomiting. 30 tablet 0   simvastatin (ZOCOR) 20 MG tablet TAKE 1 TABLET AT BEDTIME 90 tablet 2   spironolactone  (ALDACTONE) 25 MG tablet Take 0.5 tablets (12.5 mg total) by mouth 3 (three) times a week. 90 tablet 3   traMADol (ULTRAM) 50 MG tablet Take 50 mg by mouth every 12 (twelve) hours as needed for severe pain.     TRUEplus Lancets 33G MISC USE TO CHECK BLOOD SUGAR ONCE DAILY AND AS NEEDED.  DX CODE: E11.9 200 each 1   No current facility-administered medications on file prior to visit.    SH:  Smoking No    PHYSICAL EXAMINATION:    BP (!) 146/72   Wt 159 lb 12.8 oz (72.5 kg)   LMP  (LMP Unknown)   BMI 29.23 kg/m     General appearance: alert, cooperative and appears stated age Abdomen: soft, non-tender; bowel sounds normal; no masses,  no organomegaly Incisions:  C/D/I  Pelvic: External genitalia:  no lesions              Urethra:  normal appearing urethra with no masses, tenderness or lesions              Bartholins and Skenes: normal                 Vagina: normal appearing vagina with normal color and discharge, no lesions, sutures resolved              Cervix: absent              Bimanual Exam:  Uterus:  uterus absent              Adnexa: no mass, fullness, tenderness  Chaperone, Octaviano Batty, was present for exam.  Assessment/Plan: 1. Female genital prolapse, unspecified type - post op exam normal and healing is appropria  2. Pelvic relaxation  3. Urge incontinence of urine - medications and acupuncture discussed.  Pt aware decision may be influenced by upcoming cholecystectomy.  Will let us know when this is scheduled.  Follow up appt with Dr. Wannetta Sender will be made as well.  Total time with pt:  24 minutes.

## 2021-01-04 NOTE — H&P (Signed)
Heather [name] Appointment: 01/04/2021 9:30 AM Location: [facility] Patient #: [id number] DOB: [birth year] Married / Language: Heather [name] / Race: White Female  History of Present Illness [name] MD; 01/04/2021 1:19 PM) The patient is a 86 year old female who presents for evaluation of gall stones. Note for "Gall stones": ` ` ` Patient sent for surgical consultation at the request of Zehr, Laban Emperor, PA-C  Chief Complaint: Recurrent episodes of pain and gallstones.  Reconsider cholecystectomy.  ` ` The patient is a pleasant elderly woman.  Relatively active.  Here with her daughter.  She's had intermittent episodes of upper abdominal pain with nausea and vomiting.  She cannot tell me any specific food triggers.  She's had a hiatal hernia for many decades.  Recalls getting an open repair per Dr. Benard Rink back in the 1980s.  Has small recurrence by endoscopy and CT scan.  Get some mild dysphagia.  She does have chronic heartburn.  However she takes omeprazole and Pepcid and that seems to be pretty under control.  She had an episode of pain for the first time in 2018.  General shunt showed gallstones.  She was seen by Dr. Reyne Dumas with our group.  At the time the story was not classic for biliary colic and she had only 1 episode.  No strong recommendation for cholecystectomy.  However since that time, she's had repeated episodes.  She's had at least 3 more attacks.  She had a more intense attacks this year where she went to the emergency room ever having uncontrolled dry heaves.  Inked.  She is seen gastro enterology who reevaluated her - skeptical of any esophagogastric etiology.  Surgical consultation recommended to reconsider cholecystectomy.  Patient usually moves her bowels twice a day sounds like she has some constipation.  Not on a fiber supplement.  She's never smoked.  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  By cardiology.  Just on aspirin at this point.  No aggressive blood  thinners.  Had recent workup with underwhelming stress test and normal echocardiogram.  Undergone 3 surgeries this year already for bladder saying with removal and then rectocele repair.  No events without.  No exertional chest pain at all.  She can only walk about 10 minutes before her hip start to hurt and bother her.  She's not needing a walker.  She's not in a wheelchair.  No personal nor family history of GI/colon cancer, inflammatory bowel disease, irritable bowel syndrome, allergy such as Celiac Sprue, dietary/dairy problems, colitis, ulcers nor gastritis.  No recent sick contacts/gastroenteritis.  No travel outside the country.  No changes in diet.  No dysphagia to solids or liquids.   No melena, hematemesis, coffee ground emesis.  No evidence of prior gastric/peptic ulceration.  (Review of systems as stated in this history (HPI) or in the review of systems.  Otherwise all other 12 point ROS are negative) ` ` ###########################################`  This patient encounter took 35 minutes today to perform the following: obtain history, perform exam, review outside records, interpret tests & imaging, counsel the patient on their diagnosis; and, document this encounter, including findings & plan in the electronic health record (EHR).   Problem List/Past Medical [name], MD; 01/04/2021 1:19 PM) HOH (HARD OF HEARING) (H91.90)  CHRONIC CHOLECYSTITIS WITH CALCULUS (K80.10)  INCISIONAL HERNIA, WITHOUT OBSTRUCTION OR GANGRENE (K43.2)  CAD (CORONARY ARTERY DISEASE) (I25.10)  HIATAL HERNIA WITH GERD (K44.9, K21.9)  GALLSTONES (K80.20)  Gallstones with increasing episodes of  nausea vomiting and upper abdominal pain especially right sided.  Past Surgical History [name], MD; 01/04/2021 1:19 PM) Cataract Surgery  Bilateral. Foot Surgery  Left. Hysterectomy (not due to cancer) - Partial  Knee Surgery  Bilateral.  Diagnostic Studies History [name], MD; 01/04/2021 1:19  PM) Colonoscopy  5-10 years ago Mammogram  within last year Pap Smear  1-5 years ago  Allergies (Charmella Little, CNA; 01/04/2021 10:10 AM) Amoxicillin *PENICILLINS*  Baclofen *DERMATOLOGICALS*  Allergies Reconciled   Medication History (Charmella Little, CNA; 01/04/2021 10:10 AM) AmLODIPine Besylate  (10MG  Tablet, Oral) Active. Isosorbide Mononitrate ER  (60MG  Tablet ER 24HR, Oral) Active. Klor-Con M20  [facility] Tablet ER, Oral) Active. Losartan Potassium  (100MG  Tablet, Oral) Active. Metoprolol Succinate ER  (50MG  Tablet ER 24HR, Oral) Active. Simvastatin  (20MG  Tablet, Oral) Active. Omeprazole  (20MG  Packet, Oral) Active. One Touch Delica Lancets  Active. Vitamin D  (1000UNIT Tablet, Oral) Active. Diclofenac Sodium  (1% Gel, External) Active. Magnesium Oxide  (400 (241.3 Mg)MG Tablet, Oral) Active. Medications Reconciled   Social History [name], MD; 01/04/2021 1:19 PM) Caffeine use  Coffee. No alcohol use  No drug use  Tobacco use  Never smoker.  Family History [name], MD; 01/04/2021 1:19 PM) Arthritis  Mother. Cancer  Brother. Cerebrovascular Accident  Father. Depression  Daughter. Diabetes Mellitus  Brother, Daughter. Heart Disease  Father, Mother. Hypertension  Daughter, Father, Mother, Son. Migraine Headache  Daughter. Thyroid problems  Daughter.  Pregnancy / Birth History [name], MD; 01/04/2021 1:19 PM) Age at menarche  89 years. Age of menopause  18-60 Gravida  86 Maternal age  20-30 Para  32  Other Problems [name], MD; 01/04/2021 1:19 PM) Arthritis  Atrial Fibrillation  Cerebrovascular Accident  Cervical Cancer  Cholelithiasis  Diverticulosis  Gastric Ulcer  Gastroesophageal Reflux Disease  Hemorrhoids  High blood pressure  Hypercholesterolemia  Inguinal Hernia  Other disease, cancer, significant illness  Pulmonary Embolism / Blood Clot in Legs  Thyroid Disease     Vitals (Charmella Little CNA; 01/04/2021 10:10  AM) 01/04/2021 10:10 AM Weight: 158.5 lb   Height: 67 in  Body Surface Area: 1.83 m   Body Mass Index: 24.82 kg/m   Temp.: 98 F    Pulse: 59 (Regular)    P.OX: 98% (Room air) BP: 162/74(Sitting, Left Arm, Standard)        Physical Exam [name] MD; 01/04/2021 11:03 AM)  General Mental Status - Alert. General Appearance - Not in acute distress, Not Sickly. Orientation - Oriented X3. Hydration - Well hydrated. Voice - Normal.  Integumentary Global Assessment Upon inspection and palpation of skin surfaces of the - Axillae: non-tender, no inflammation or ulceration, no drainage. and Distribution of scalp and body hair is normal. General Characteristics Temperature - normal warmth is noted.  Head and Neck Head - normocephalic, atraumatic with no lesions or palpable masses. Face Global Assessment - atraumatic, no absence of expression. Neck Global Assessment - no abnormal movements, no bruit auscultated on the right, no bruit auscultated on the left, no decreased range of motion, non-tender. Trachea - midline. Thyroid Gland Characteristics - non-tender.  Eye Eyeball - Left - Extraocular movements intact, No Nystagmus - Left. Eyeball - Right - Extraocular movements intact, No Nystagmus - Right. Cornea - Left - No Hazy - Left. Cornea - Right - No Hazy - Right. Sclera/Conjunctiva - Left - No scleral icterus, No Discharge - Left. Sclera/Conjunctiva - Right - No scleral icterus,  No Discharge - Right. Pupil - Left - Direct reaction to light normal. Pupil - Right - Direct reaction to light normal.  ENMT Ears Pinna - Left - no drainage observed, no generalized tenderness observed. Pinna - Right - no drainage observed, no generalized tenderness observed. Nose and Sinuses External Inspection of the Nose - no destructive lesion observed. Inspection of the nares - Left - quiet respiration. Inspection of the nares - Right - quiet respiration. Mouth and Throat Lips - Upper  Lip - no fissures observed, no pallor noted. Lower Lip - no fissures observed, no pallor noted. Nasopharynx - no discharge present. Oral Cavity/Oropharynx - Tongue - no dryness observed. Oral Mucosa - no cyanosis observed. Hypopharynx - no evidence of airway distress observed. Note:  Hard of hearing  Chest and Lung Exam Inspection Movements - Normal and Symmetrical. Accessory muscles - No use of accessory muscles in breathing. Palpation Palpation of the chest reveals - Non-tender. Auscultation Breath sounds - Normal and Clear.  Cardiovascular Auscultation Rhythm - Regular. Murmurs & Other Heart Sounds - Auscultation of the heart reveals - No Murmurs and No Systolic Clicks.  Abdomen Inspection Inspection of the abdomen reveals - No Visible peristalsis and No Abnormal pulsations. Umbilicus - No Bleeding, No Urine drainage. Palpation/Percussion Palpation and Percussion of the abdomen reveal - Soft, Non Tender, No Rebound tenderness, No Rigidity (guarding) and No Cutaneous hyperesthesia. Note:  Epigastric and especially right upper quadrant discomfort along subcostal ridge. Not a classic Murphy sign but she didn't jump a little. Upper quadrant and lower abdomen nontender.  Numerous incisions and a 5 cm mass reduces too much smaller hernia. Mildly sensitive but not severe.   Abdomen soft.  Not severely distended.  No diastasis recti.  No other anterior abdominal wall hernias  Female Genitourinary Sexual Maturity Tanner 5 - Adult hair pattern. Note:  No vaginal bleeding nor discharge  Peripheral Vascular Upper Extremity Inspection - Left - No Cyanotic nailbeds - Left, Not Ischemic. Inspection - Right - No Cyanotic nailbeds - Right, Not Ischemic.  Neurologic Neurologic evaluation reveals  - normal attention span and ability to concentrate, able to name objects and repeat phrases. Appropriate fund of knowledge , normal sensation and normal coordination. Mental Status Affect - not  angry, not paranoid. Cranial Nerves - Normal Bilaterally. Gait - Normal.  Neuropsychiatric Mental status exam performed with findings of - able to articulate well with normal speech/language, rate, volume and coherence, thought content normal with ability to perform basic computations and apply abstract reasoning and no evidence of hallucinations, delusions, obsessions or homicidal/suicidal ideation.  Musculoskeletal Global Assessment Spine, Ribs and Pelvis - no instability, subluxation or laxity. Right Upper Extremity - no instability, subluxation or laxity. 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 Axillary  General Axillary Region: Bilateral - Description - No Localized lymphadenopathy. Femoral & Inguinal  Generalized Femoral & Inguinal Lymphatics: Left - Description - No Localized lymphadenopathy. Right - Description - No Localized lymphadenopathy.    Assessment & Plan [name] MD; 01/04/2021 1:05 PM)  CHRONIC CHOLECYSTITIS WITH CALCULUS (K80.10) Impression: She's had repeated attacks and one severe enough to go the emergency room. Her chronic hiatal hernia and heartburn had been followed by gastroenterology does not seem bothersome as much now that she is on double medication.  I think she would benefit  from cholecystectomy. She would like to see if the hernia can be repaired as well. Reasonable start that way with less lesions. Normally outpatient surgery (her advanced age, we'll watch overnight. She is interested in proceeding.  Apparently she just recovered from some pelvic floor cystocele/rectocele surgery in April. Okay from that.  I gave extra time for the patient and her daughter do expressed history and concerns. They seem reassured. Cardiology just saw her and cleared her in  anticipation of probable cholecystectomy. I think it is reasonable to schedule. They agree.  Current Plans You are being scheduled for surgery - Our schedulers will call you.   You should hear from our office's scheduling department within 5 working days about the location, date, and time of surgery.  We try to make accommodations for patient's preferences in scheduling surgery, but sometimes the OR schedule or the surgeon's schedule prevents [place] from making those accommodations.  If you have not heard from our office [phone]) in 5 working days, call the office and ask for your surgeon's nurse.  If you have other questions about your diagnosis, plan, or surgery, call the office and ask for your surgeon's nurse.  Written instructions provided Pt Education - Pamphlet Given - Laparoscopic Gallbladder Surgery: discussed with patient and provided information. The anatomy & physiology of hepatobiliary & pancreatic function was discussed.  The pathophysiology of gallbladder dysfunction was discussed.  Natural history risks without surgery was discussed.   I feel the risks of no intervention will lead to serious problems that outweigh the operative risks; therefore, I recommended cholecystectomy to remove the pathology.  I explained laparoscopic techniques with possible need for an open approach.  Probable cholangiogram to evaluate the bilary tract was explained as well.   Risks such as bleeding, infection, abscess, leak, injury to other organs, need for further treatment, heart attack, death, and other risks were discussed.  I noted a good likelihood this will help address the problem.  Possibility that this will not correct all abdominal symptoms was explained.  Goals of post-operative recovery were discussed as well.  We will work to minimize complications.  An educational handout further explaining the pathology and treatment options was given as well.  Questions were answered.  The patient expresses  understanding & wishes to proceed with surgery.  Pt Education - [facility] Laparosopic Post Op HCI (Idamae Coccia) Pt Education - [facility] Good Bowel Health (Cassidey Barrales) Pt Education - Laparoscopic Cholecystectomy: gallbladder  GALLSTONES (K80.20) Story: Gallstones with increasing episodes of nausea vomiting and upper abdominal pain especially right sided.   INCISIONAL HERNIA, WITHOUT OBSTRUCTION OR GANGRENE (K43.2) Impression: He does have a periumbilical incisional hernia. It is reducible. About 15 mm. Reasonable to remove gallbladder through there and primarily repair. Ideally we do mesh repair but did not think that is a safe option with cholecystectomy. She likes the idea of trying to get that fixed as well.  Current Plans [facility] Consent - Hernia Repair - Ventral/Incisional/Umbilical (Ayzia Day): discussed with patient and provided information.  HIATAL HERNIA WITH GERD (K44.9) Impression: Recurrent hiatal hernia. Apparently had prior hernia repair in the 1980s by Dr. Benard Rink. No records available.  She definitely has a recurrent hiatal hernia by endoscopy CAT scan. She seems to have some dysphagia and heartburn issues. However things seem to be under control with the use of omeprazole and Pepcid. Those issues are stable and not concerning. Did not push strongly for any redo hiatal hernia repair at this point. She agrees.   CAD (CORONARY ARTERY  DISEASE) (I25.10) Impression: Known coronary disease with decent performance status despite her chronic hip and knee issues. Recently seen by cardiology in May. Ejection fraction 61%. Cleared for surgery by them in anticipation of probable surgery. Just on aspirin which she can continue.   HOH (HARD OF HEARING) (H91.90)  [name], MD, FACS, MASCRS Esophageal, Gastrointestinal & Colorectal Surgery Robotic and Minimally Invasive Surgery  [facility] [address] [place], [place] [address] [phone] Fax [phone]  Main/Paging  CONTACT INFORMATION: Weekday (9AM-5PM) concerns: Call [facility] main office at [phone] Weeknight (5PM-9AM) or Weekend/Holiday concerns: Check [email or web address] for General Surgery [facility] coverage (Please, do not use SecureChat as it is not reliable communication to operating surgeons for immediate patient care)

## 2021-01-04 NOTE — H&P (View-Only) (Signed)
[name] Appointment: 01/04/2021 9:30 AM Location: [facility] Patient #: [id number] DOB: Aug 29, 1934 Married / Language: [name] / Race: White Female  History of Present Illness [name] MD; 01/04/2021 1:19 PM) The patient is a 86 year old female who presents for evaluation of gall stones. Note for "Gall stones": ` ` ` Patient sent for surgical consultation at the request of Zehr, Laban Emperor, PA-C  Chief Complaint: Recurrent episodes of pain and gallstones.  Reconsider cholecystectomy.  ` ` The patient is a pleasant elderly woman.  Relatively active.  Here with her daughter.  She's had intermittent episodes of upper abdominal pain with nausea and vomiting.  She cannot tell me any specific food triggers.  She's had a hiatal hernia for many decades.  Recalls getting an open repair per Dr. Benard Rink back in the 1980s.  Has small recurrence by endoscopy and CT scan.  Get some mild dysphagia.  She does have chronic heartburn.  However she takes omeprazole and Pepcid and that seems to be pretty under control.  She had an episode of pain for the first time in 2018.  General shunt showed gallstones.  She was seen by Dr. Reyne Dumas with our group.  At the time the story was not classic for biliary colic and she had only 1 episode.  No strong recommendation for cholecystectomy.  However since that time, she's had repeated episodes.  She's had at least 3 more attacks.  She had a more intense attacks this year where she went to the emergency room ever having uncontrolled dry heaves.  Inked.  She is seen gastro enterology who reevaluated her - skeptical of any esophagogastric etiology.  Surgical consultation recommended to reconsider cholecystectomy.  Patient usually moves her bowels twice a day sounds like she has some constipation.  Not on a fiber supplement.  She's never smoked.  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  By cardiology.  Just on aspirin at this point.  No aggressive blood  thinners.  Had recent workup with underwhelming stress test and normal echocardiogram.  Undergone 3 surgeries this year already for bladder saying with removal and then rectocele repair.  No events without.  No exertional chest pain at all.  She can only walk about 10 minutes before her hip start to hurt and bother her.  She's not needing a walker.  She's not in a wheelchair.  No personal nor family history of GI/colon cancer, inflammatory bowel disease, irritable bowel syndrome, allergy such as Celiac Sprue, dietary/dairy problems, colitis, ulcers nor gastritis.  No recent sick contacts/gastroenteritis.  No travel outside the country.  No changes in diet.  No dysphagia to solids or liquids.   No melena, hematemesis, coffee ground emesis.  No evidence of prior gastric/peptic ulceration.  (Review of systems as stated in this history (HPI) or in the review of systems.  Otherwise all other 12 point ROS are negative) ` ` ###########################################`  This patient encounter took 35 minutes today to perform the following: obtain history, perform exam, review outside records, interpret tests & imaging, counsel the patient on their diagnosis; and, document this encounter, including findings & plan in the electronic health record (EHR).   Problem List/Past Medical [name], MD; 01/04/2021 1:19 PM) HOH (HARD OF HEARING) (H91.90)  CHRONIC CHOLECYSTITIS WITH CALCULUS (K80.10)  INCISIONAL HERNIA, WITHOUT OBSTRUCTION OR GANGRENE (K43.2)  CAD (CORONARY ARTERY DISEASE) (I25.10)  HIATAL HERNIA WITH GERD (K44.9, K21.9)  GALLSTONES (K80.20)  Gallstones with increasing episodes of  nausea vomiting and upper abdominal pain especially right sided.  Past Surgical History [name], MD; 01/04/2021 1:19 PM) Cataract Surgery  Bilateral. Foot Surgery  Left. Hysterectomy (not due to cancer) - Partial  Knee Surgery  Bilateral.  Diagnostic Studies History [name], MD; 01/04/2021 1:19  PM) Colonoscopy  5-10 years ago Mammogram  within last year Pap Smear  1-5 years ago  Allergies (Charmella Little, CNA; 01/04/2021 10:10 AM) Amoxicillin *PENICILLINS*  Baclofen *DERMATOLOGICALS*  Allergies Reconciled   Medication History (Charmella Little, CNA; 01/04/2021 10:10 AM) AmLODIPine Besylate  (10MG  Tablet, Oral) Active. Isosorbide Mononitrate ER  (60MG  Tablet ER 24HR, Oral) Active. Klor-Con M20  [facility] Tablet ER, Oral) Active. Losartan Potassium  (100MG  Tablet, Oral) Active. Metoprolol Succinate ER  (50MG  Tablet ER 24HR, Oral) Active. Simvastatin  (20MG  Tablet, Oral) Active. Omeprazole  (20MG  Packet, Oral) Active. One Touch Delica Lancets  Active. Vitamin D  (1000UNIT Tablet, Oral) Active. Diclofenac Sodium  (1% Gel, External) Active. Magnesium Oxide  (400 (241.3 Mg)MG Tablet, Oral) Active. Medications Reconciled   Social History [name], MD; 01/04/2021 1:19 PM) Caffeine use  Coffee. No alcohol use  No drug use  Tobacco use  Never smoker.  Family History [name], MD; 01/04/2021 1:19 PM) Arthritis  Mother. Cancer  Brother. Cerebrovascular Accident  Father. Depression  Daughter. Diabetes Mellitus  Brother, Daughter. Heart Disease  Father, Mother. Hypertension  Daughter, Father, Mother, Son. Migraine Headache  Daughter. Thyroid problems  Daughter.  Pregnancy / Birth History [name], MD; 01/04/2021 1:19 PM) Age at menarche  43 years. Age of menopause  43-60 Gravida  34 Maternal age  43-30 Para  58  Other Problems [name], MD; 01/04/2021 1:19 PM) Arthritis  Atrial Fibrillation  Cerebrovascular Accident  Cervical Cancer  Cholelithiasis  Diverticulosis  Gastric Ulcer  Gastroesophageal Reflux Disease  Hemorrhoids  High blood pressure  Hypercholesterolemia  Inguinal Hernia  Other disease, cancer, significant illness  Pulmonary Embolism / Blood Clot in Legs  Thyroid Disease     Vitals (Charmella Little CNA; 01/04/2021 10:10  AM) 01/04/2021 10:10 AM Weight: 158.5 lb   Height: 67 in  Body Surface Area: 1.83 m   Body Mass Index: 24.82 kg/m   Temp.: 98 F    Pulse: 59 (Regular)    P.OX: 98% (Room air) BP: 162/74(Sitting, Left Arm, Standard)        Physical Exam [name] MD; 01/04/2021 11:03 AM)  General Mental Status - Alert. General Appearance - Not in acute distress, Not Sickly. Orientation - Oriented X3. Hydration - Well hydrated. Voice - Normal.  Integumentary Global Assessment Upon inspection and palpation of skin surfaces of the - Axillae: non-tender, no inflammation or ulceration, no drainage. and Distribution of scalp and body hair is normal. General Characteristics Temperature - normal warmth is noted.  Head and Neck Head - normocephalic, atraumatic with no lesions or palpable masses. Face Global Assessment - atraumatic, no absence of expression. Neck Global Assessment - no abnormal movements, no bruit auscultated on the right, no bruit auscultated on the left, no decreased range of motion, non-tender. Trachea - midline. Thyroid Gland Characteristics - non-tender.  Eye Eyeball - Left - Extraocular movements intact, No Nystagmus - Left. Eyeball - Right - Extraocular movements intact, No Nystagmus - Right. Cornea - Left - No Hazy - Left. Cornea - Right - No Hazy - Right. Sclera/Conjunctiva - Left - No scleral icterus, No Discharge - Left. Sclera/Conjunctiva - Right - No scleral icterus,  No Discharge - Right. Pupil - Left - Direct reaction to light normal. Pupil - Right - Direct reaction to light normal.  ENMT Ears Pinna - Left - no drainage observed, no generalized tenderness observed. Pinna - Right - no drainage observed, no generalized tenderness observed. Nose and Sinuses External Inspection of the Nose - no destructive lesion observed. Inspection of the nares - Left - quiet respiration. Inspection of the nares - Right - quiet respiration. Mouth and Throat Lips - Upper  Lip - no fissures observed, no pallor noted. Lower Lip - no fissures observed, no pallor noted. Nasopharynx - no discharge present. Oral Cavity/Oropharynx - Tongue - no dryness observed. Oral Mucosa - no cyanosis observed. Hypopharynx - no evidence of airway distress observed. Note:  Hard of hearing  Chest and Lung Exam Inspection Movements - Normal and Symmetrical. Accessory muscles - No use of accessory muscles in breathing. Palpation Palpation of the chest reveals - Non-tender. Auscultation Breath sounds - Normal and Clear.  Cardiovascular Auscultation Rhythm - Regular. Murmurs & Other Heart Sounds - Auscultation of the heart reveals - No Murmurs and No Systolic Clicks.  Abdomen Inspection Inspection of the abdomen reveals - No Visible peristalsis and No Abnormal pulsations. Umbilicus - No Bleeding, No Urine drainage. Palpation/Percussion Palpation and Percussion of the abdomen reveal - Soft, Non Tender, No Rebound tenderness, No Rigidity (guarding) and No Cutaneous hyperesthesia. Note:  Epigastric and especially right upper quadrant discomfort along subcostal ridge. Not a classic Murphy sign but she didn't jump a little. Upper quadrant and lower abdomen nontender.  Numerous incisions and a 5 cm mass reduces too much smaller hernia. Mildly sensitive but not severe.   Abdomen soft.  Not severely distended.  No diastasis recti.  No other anterior abdominal wall hernias  Female Genitourinary Sexual Maturity Tanner 5 - Adult hair pattern. Note:  No vaginal bleeding nor discharge  Peripheral Vascular Upper Extremity Inspection - Left - No Cyanotic nailbeds - Left, Not Ischemic. Inspection - Right - No Cyanotic nailbeds - Right, Not Ischemic.  Neurologic Neurologic evaluation reveals  - normal attention span and ability to concentrate, able to name objects and repeat phrases. Appropriate fund of knowledge , normal sensation and normal coordination. Mental Status Affect - not  angry, not paranoid. Cranial Nerves - Normal Bilaterally. Gait - Normal.  Neuropsychiatric Mental status exam performed with findings of - able to articulate well with normal speech/language, rate, volume and coherence, thought content normal with ability to perform basic computations and apply abstract reasoning and no evidence of hallucinations, delusions, obsessions or homicidal/suicidal ideation.  Musculoskeletal Global Assessment Spine, Ribs and Pelvis - no instability, subluxation or laxity. Right Upper Extremity - no instability, subluxation or laxity. 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 Axillary  General Axillary Region: Bilateral - Description - No Localized lymphadenopathy. Femoral & Inguinal  Generalized Femoral & Inguinal Lymphatics: Left - Description - No Localized lymphadenopathy. Right - Description - No Localized lymphadenopathy.    Assessment & Plan [name] MD; 01/04/2021 1:05 PM)  CHRONIC CHOLECYSTITIS WITH CALCULUS (K80.10) Impression: She's had repeated attacks and one severe enough to go the emergency room. Her chronic hiatal hernia and heartburn had been followed by gastroenterology does not seem bothersome as much now that she is on double medication.  I think she would benefit  from cholecystectomy. She would like to see if the hernia can be repaired as well. Reasonable start that way with less lesions. Normally outpatient surgery (her advanced age, we'll watch overnight. She is interested in proceeding.  Apparently she just recovered from some pelvic floor cystocele/rectocele surgery in April. Okay from that.  I gave extra time for the patient and her daughter do expressed history and concerns. They seem reassured. Cardiology just saw her and cleared her in  anticipation of probable cholecystectomy. I think it is reasonable to schedule. They agree.  Current Plans You are being scheduled for surgery - Our schedulers will call you.   You should hear from our office's scheduling department within 5 working days about the location, date, and time of surgery.  We try to make accommodations for patient's preferences in scheduling surgery, but sometimes the OR schedule or the surgeon's schedule prevents [place] from making those accommodations.  If you have not heard from our office [phone]) in 5 working days, call the office and ask for your surgeon's nurse.  If you have other questions about your diagnosis, plan, or surgery, call the office and ask for your surgeon's nurse.  Written instructions provided Pt Education - Pamphlet Given - Laparoscopic Gallbladder Surgery: discussed with patient and provided information. The anatomy & physiology of hepatobiliary & pancreatic function was discussed.  The pathophysiology of gallbladder dysfunction was discussed.  Natural history risks without surgery was discussed.   I feel the risks of no intervention will lead to serious problems that outweigh the operative risks; therefore, I recommended cholecystectomy to remove the pathology.  I explained laparoscopic techniques with possible need for an open approach.  Probable cholangiogram to evaluate the bilary tract was explained as well.   Risks such as bleeding, infection, abscess, leak, injury to other organs, need for further treatment, heart attack, death, and other risks were discussed.  I noted a good likelihood this will help address the problem.  Possibility that this will not correct all abdominal symptoms was explained.  Goals of post-operative recovery were discussed as well.  We will work to minimize complications.  An educational handout further explaining the pathology and treatment options was given as well.  Questions were answered.  The patient expresses  understanding & wishes to proceed with surgery.  Pt Education - [facility] Laparosopic Post Op HCI (Elysia Grand) Pt Education - [facility] Good Bowel Health (Madilynn Montante) Pt Education - Laparoscopic Cholecystectomy: gallbladder  GALLSTONES (K80.20) Story: Gallstones with increasing episodes of nausea vomiting and upper abdominal pain especially right sided.   INCISIONAL HERNIA, WITHOUT OBSTRUCTION OR GANGRENE (K43.2) Impression: He does have a periumbilical incisional hernia. It is reducible. About 15 mm. Reasonable to remove gallbladder through there and primarily repair. Ideally we do mesh repair but did not think that is a safe option with cholecystectomy. She likes the idea of trying to get that fixed as well.  Current Plans [facility] Consent - Hernia Repair - Ventral/Incisional/Umbilical (Rafal Archuleta): discussed with patient and provided information.  HIATAL HERNIA WITH GERD (K44.9) Impression: Recurrent hiatal hernia. Apparently had prior hernia repair in the 1980s by Dr. Benard Rink. No records available.  She definitely has a recurrent hiatal hernia by endoscopy CAT scan. She seems to have some dysphagia and heartburn issues. However things seem to be under control with the use of omeprazole and Pepcid. Those issues are stable and not concerning. Did not push strongly for any redo hiatal hernia repair at this point. She agrees.   CAD (CORONARY ARTERY  DISEASE) (I25.10) Impression: Known coronary disease with decent performance status despite her chronic hip and knee issues. Recently seen by cardiology in May. Ejection fraction 61%. Cleared for surgery by them in anticipation of probable surgery. Just on aspirin which she can continue.   HOH (HARD OF HEARING) (H91.90)  [name], MD, FACS, MASCRS Esophageal, Gastrointestinal & Colorectal Surgery Robotic and Minimally Invasive Surgery  [facility] [address], [place], [place] [address] [phone] Fax [phone]  Main/Paging  CONTACT INFORMATION: Weekday (9AM-5PM) concerns: Call [facility] main office at [phone] Weeknight (5PM-9AM) or Weekend/Holiday concerns: Check [email or web address] for General Surgery [facility] coverage (Please, do not use SecureChat as it is not reliable communication to operating surgeons for immediate patient care)

## 2021-01-05 ENCOUNTER — Telehealth: Payer: Self-pay | Admitting: *Deleted

## 2021-01-05 ENCOUNTER — Encounter: Payer: Self-pay | Admitting: Family Medicine

## 2021-01-05 ENCOUNTER — Ambulatory Visit: Payer: Medicare HMO | Admitting: Family Medicine

## 2021-01-05 ENCOUNTER — Other Ambulatory Visit: Payer: Self-pay

## 2021-01-05 ENCOUNTER — Ambulatory Visit: Payer: Self-pay

## 2021-01-05 VITALS — BP 144/72 | Ht 62.0 in | Wt 159.0 lb

## 2021-01-05 DIAGNOSIS — M8430XA Stress fracture, unspecified site, initial encounter for fracture: Secondary | ICD-10-CM

## 2021-01-05 DIAGNOSIS — M25511 Pain in right shoulder: Secondary | ICD-10-CM | POA: Diagnosis not present

## 2021-01-05 DIAGNOSIS — M79605 Pain in left leg: Secondary | ICD-10-CM

## 2021-01-05 DIAGNOSIS — Z13820 Encounter for screening for osteoporosis: Secondary | ICD-10-CM | POA: Insufficient documentation

## 2021-01-05 MED ORDER — PREDNISONE 5 MG PO TABS: ORAL_TABLET | ORAL | 0 refills | Status: DC

## 2021-01-05 NOTE — Patient Instructions (Signed)
Good to see you Please try the aircast and limit walking on the left leg  Please try the prednisone  I will call with the results of the bone density when they are in  Please send me a message in MyChart with any questions or updates.  Please see me back in 4 weeks.   --Dr. Raeford Razor

## 2021-01-05 NOTE — Assessment & Plan Note (Signed)
She is having arthralgias in multiple joints in the right sternoclavicular joint is enlarged when compared to the left.  Does demonstrate a synovitis on exam.  Possible for inflammatory in origin. -Counseled on supportive care. -Prednisone. -Could consider lab work.

## 2021-01-05 NOTE — Assessment & Plan Note (Signed)
Has a history of stress fracture.  Symptoms seem most consistent with a stress reaction of the bone. -Counseled on home exercise therapy and supportive care. -Aircast. -Could consider cam walker if necessary. -Evaluating bone density

## 2021-01-05 NOTE — Telephone Encounter (Signed)
   [facility] Pre-operative Risk Assessment    Patient Name: Heather [name]  DOB: [birth year]  MRN: [id number]   [facility]ARE STAFF: - Please ensure there is not already an duplicate clearance open for this procedure. - Under Visit Info/Reason for Call, type in Other and utilize the format Clearance MM/DD/YY or Clearance TBD. Do not use dashes or single digits. - If request is for dental extraction, please clarify the # of teeth to be extracted. - If the patient is currently at the dentist's office, call Pre-Op APP to address. If the patient is not currently in the dentist office, please route to the Pre-Op pool  Request for surgical clearance:  What type of surgery is being performed? LAP CHOLE   When is this surgery scheduled? TBD   What type of clearance is required (medical clearance vs. Pharmacy clearance to hold med vs. Both)? MEDICAL  Are there any medications that need to be held prior to surgery and how long? ASA    Practice name and name of physician performing surgery? [facility]; DR. Remo Lipps [place]   What is the office phone number? [phone]   7.   What is the office fax number? [place], CMA  8.   Anesthesia type (None, local, MAC, general) ? GENERAL   Julaine Hua 01/05/2021, 2:13 PM  _________________________________________________________________   (provider comments below)

## 2021-01-05 NOTE — Progress Notes (Signed)
Heather [name] - 86 y.o. female MRN [id number]  Date of birth: [birth year]  SUBJECTIVE:  Including CC & ROS.  No chief complaint on file.   Heather [name] is a 86 y.o. female that is presenting with left leg pain and right sternoclavicular joint pain.  Left leg pain is occurring over the distal tibia.  It is worse with prolonged standing.  Feels similar to her previous stress fracture in her foot.  The right [place] joint has been swelling over the past few weeks.  No improvement with therapy thus far..  Independent review of the left tibia and fibula x-ray from 6/16 shows no acute changes.   Review of Systems See HPI   HISTORY: Past Medical, Surgical, Social, and Family History Reviewed & Updated per EMR.   Pertinent Historical Findings include:  Past Medical History:  Diagnosis Date   Chronic stable angina ([place])    Coronary artery disease cardiologist--- dr Burt Knack   hx NSTEMI  w/ cardiac cath 03-16-2005 PCI with DES to LCx;   cardiac cath 06-27-2005  PCI with DES to RCA with residual dx LAD manage medcially;  lexiscan 01-26-20211 normal no ischemia, ef 70%;  stress echo w/ dobutamine 10-24-2011 negative ishcmeia , normal ef // Myoview 2/22: EF 67, no ischemia or infarction, no TID, low risk    Diabetes mellitus type 2, diet-controlled ([place])    followed by pcp  (10-19-2020  pt stated checks daily in am,  fasting blood surgar--- 115--120s)   DOE (dyspnea on exertion)    per pt "when I over do",  ok with household chores   Echocardiogram 08/2020    Echocardiogram 2/22: EF 55-60, no RWMA, mild LVH, Gr 2 DD, GLS-21.7%, normal RVSF, trivial MR, RVSP 39.5   Edema of right lower extremity    GERD (gastroesophageal reflux disease)    Hiatal hernia    recurrence,  hx HH repair 1989   History of cervical cancer    s/p  vaginal hysterectomy   History of DVT of lower extremity 2016   11-29-2014 post op right TKA of right lower extremity and completed xarelto    History of esophageal stricture     hx s/p dilatation's   History of gastric ulcer 2005 approx.   History of palpitations 2010   event monitor 07-07-2009 showed NSR w/ freq. SVT ectopies with short runs, rare PVCs   History of TIA (transient ischemic attack) 06/1999   12-15-2019  per pt had several TIA between 12/ 2000 to 02/ 2001 , was sent to specialist @[facility] , had test that was normal (10-19-2020 pt stated no TIAs since ) but has residual of essential tremors of right arm/ hand   Hypertension    Intermittent palpitations    IT band syndrome    Migraine    "ice pick headche lasts about 30 seconds"   Mixed hyperlipidemia    Mixed incontinence urge and stress    Multiple thyroid nodules    followed by pcp---   ultrasound 11-22-2014 no bx   (12-15-2019 per pt had a endocrinologist and was told did not need bx)   OA (osteoarthritis)    knees, elbow, hip, ankles   Occasional tremors    right arm/ hand  s/p TIA residual 2000   Osteoporosis    taking vitamin d   Right bundle branch block (RBBB) with left anterior fascicular block (LAFB)    RLS (restless legs syndrome)    S/P drug eluting coronary stent placement 2006  03-16-2005  PCI x1 DES to LCx;   06-27-2005  PCI x1 DES to RCA   Urinary retention    post op sling prodecure on 10-27-2020, has foley cathether    Past Surgical History:  Procedure Laterality Date   ANTERIOR AND POSTERIOR REPAIR N/A 12/22/2019   Procedure: ANTERIOR (CYSTOCELE)  REPAIR;  Surgeon: Janyth Contes, MD;  Location: [place];  Service: Gynecology;  Laterality: N/A;   ANTERIOR AND POSTERIOR REPAIR WITH SACROSPINOUS FIXATION N/A 10/27/2020   Procedure: SACROSPINOUS LIGAMENT FIXATION;  Surgeon: Jaquita Folds, MD;  Location: [facility];  Service: Gynecology;  Laterality: N/A;   BLADDER SUSPENSION N/A 10/27/2020   Procedure: TRANSVAGINAL TAPE (TVT) PROCEDURE;  Surgeon: Jaquita Folds, MD;  Location: [facility];  Service: Gynecology;   Laterality: N/A;   CATARACT EXTRACTION W/ INTRAOCULAR LENS  IMPLANT, BILATERAL  2015   COLONOSCOPY  last one ?   CORONARY ANGIOPLASTY WITH STENT PLACEMENT  03-16-2005   dr Lia Foyer   PCI and DES x1 to LCx   CORONARY ANGIOPLASTY WITH STENT PLACEMENT  06-27-2005  dr Lia Foyer   PCI and DES x1 to RCA with residual disease LAD 70-80% to manage medically   CYSTOSCOPY N/A 10/27/2020   Procedure: CYSTOSCOPY;  Surgeon: Jaquita Folds, MD;  [facility];  Service: Gynecology;  Laterality: N/A;   CYSTOSCOPY N/A 11/09/2020   Procedure: CYSTOSCOPY;  Surgeon: Jaquita Folds, MD;  Location: [facility];  Service: Gynecology;  Laterality: N/A;   FOOT SURGERY Left 1990s   left foot stress fracture repair, per pt no hardware   HIATAL HERNIA REPAIR  1989   KNEE ARTHROSCOPY Bilateral right ?/   left x2 , last one 09-12-2009 @ [place]   PUBOVAGINAL SLING N/A 11/09/2020   Procedure: [place];  Surgeon: Jaquita Folds, MD;  Location: [facility];  Service: Gynecology;  Laterality: N/A;   RECTOCELE REPAIR N/A 04/20/2020   Procedure: POSTERIOR REPAIR (RECTOCELE);  Surgeon: Janyth Contes, MD;  Location: [facility];  Service: Gynecology;  Laterality: N/A;   RECTOCELE REPAIR N/A 10/27/2020   Procedure: POSTERIOR REPAIR (RECTOCELE);  Surgeon: Jaquita Folds, MD;  Location: [facility];  Service: Gynecology;  Laterality: N/A;  total time requested for all procedures is 2 hours   TOTAL KNEE ARTHROPLASTY  11/12/2011   Procedure: TOTAL KNEE ARTHROPLASTY;  Surgeon: Lorn Junes, MD;  Location: [place];  Service: Orthopedics;  Laterality: Left;  Dr Noemi Chapel wants 90 minutes for this case   TOTAL KNEE ARTHROPLASTY Right 11/29/2014   Procedure: RIGHT TOTAL KNEE ARTHROPLASTY;  Surgeon: Vickey Huger, MD;  Location: [place];  Service: Orthopedics;  Laterality: Right;   UPPER GASTROINTESTINAL ENDOSCOPY  last  one 04-25-2017   with dilatation esophageal stricture and savary dilatation   VAGINAL HYSTERECTOMY  1988    no ovaries removed for bleeeding    Family History  Problem Relation Age of Onset   Stroke Father        family hx of M 1st degree relative <50   Coronary artery disease Mother    Heart disease Mother    Depression Brother    Stroke Brother    Diabetes Brother    Cancer Brother        bladder with mets   Diabetes Daughter        borderline   Hypertension Daughter    Arthritis Other  family hx of   Hypertension Other        family hx of   Other Other        family hx of cardiovascular disorder   Thyroid disease Daughter    Breast cancer Neg Hx    Colon cancer Neg Hx    Anesthesia problems Neg Hx    Hypotension Neg Hx    Malignant hyperthermia Neg Hx    Pseudochol deficiency Neg Hx    Colon polyps Neg Hx    Esophageal cancer Neg Hx    Rectal cancer Neg Hx    Stomach cancer Neg Hx     Social History   Socioeconomic History   Marital status: Widowed    Spouse name: Not on file   Number of children: 3   Years of education: 36   Highest education level: Not on file  Occupational History   Occupation: works partime in Office manager: RETIRED    Comment: retired  Tobacco Use   Smoking status: Never   Smokeless tobacco: Never  Vaping Use   Vaping Use: Never used  Substance and Sexual Activity   Alcohol use: No    Alcohol/week: 0.0 standard drinks   Drug use: Never   Sexual activity: Not Currently    Birth control/protection: Surgical    Comment: lives alone  Other Topics Concern   Not on file  Social History Narrative   Lives with husband, Caffeine use- Half Caffeine)- 2 cups daily.  3 children living, one passed away.   Education: HS. Business college.  Retired.    Social Determinants of Health   Financial Resource Strain: Low Risk    Difficulty of Paying Living Expenses: Not hard at all  Food Insecurity: Not on file  Transportation  Needs: Not on file  Physical Activity: Inactive   Days of Exercise per Week: 0 days   Minutes of Exercise per Session: 0 min  Stress: Not on file  Social Connections: Not on file  Intimate Partner Violence: Not on file     PHYSICAL EXAM:  VS: BP (!) 144/72 (BP Location: Left Arm, Patient Position: Sitting, Cuff Size: Normal)   Ht 5\' 2"  (1.575 m)   Wt 159 lb (72.1 kg)   LMP  (LMP Unknown)   BMI 29.08 kg/m  Physical Exam Gen: NAD, alert, cooperative with exam, well-appearing MSK:  Left leg: Tenderness palpation of the distal tibia. No swelling or ecchymosis. Normal range of motion of ankle. Neurovascular intact  Limited ultrasound: Left distal tibia, right sternoclavicular joint:  Left distal tibia: Increased hyperemia over the fibular portion of the distal tibia.  No specific change in the cortex of the distal tibia. No effusion of the ankle joint. Cobblestoning appreciated within the subcutaneous in this area  Right sternoclavicular joint: Increased hyperemia and effusion of the joint.  Summary: Findings of the distal tibia consistent with stress reaction.  Synovitis of the right sternoclavicular joint.  Ultrasound and interpretation by Clearance Coots, MD    ASSESSMENT & PLAN:   Stress reaction of bone Has a history of stress fracture.  Symptoms seem most consistent with a stress reaction of the bone. -Counseled on home exercise therapy and supportive care. -Aircast. -Could consider cam walker if necessary. -Evaluating bone density  Pain of right sternoclavicular joint She is having arthralgias in multiple joints in the right sternoclavicular joint is enlarged when compared to the left.  Does demonstrate a synovitis on exam.  Possible for inflammatory in origin. -  Counseled on supportive care. -Prednisone. -Could consider lab work.

## 2021-01-07 ENCOUNTER — Encounter (HOSPITAL_BASED_OUTPATIENT_CLINIC_OR_DEPARTMENT_OTHER): Payer: Self-pay | Admitting: Obstetrics & Gynecology

## 2021-01-11 ENCOUNTER — Telehealth: Payer: Self-pay | Admitting: Pharmacist

## 2021-01-11 NOTE — Chronic Care Management (AMB) (Signed)
Chronic Care Management Pharmacy Assistant   Name: Heather [name]  MRN: [id number] DOB: 10/10/1934  Reason for Encounter: Disease State Hypertension   Recent office visits:  None Listed  Recent consult visits:  01/05/21 Clearance Coots MD (Sports Medicine)- Patient seen for left leg pain. Started Prednisone 5 mg taper and advised to follow up in 4 weeks.  01/04/21 Hale Bogus MD(OBGYN)- Patient seen for vaginal prolapse. Follow up appt with Dr. Wannetta Sender will be made.  12/14/20 [name] PA-C(Cardiology)- Patient seen for Coronary Artery Disease. Patient increased to isosorbide mononitrate 90 MG. Follow up in 6 months.  Hospital visits:  Medication Reconciliation was completed by comparing discharge summary, patient's EMR and Pharmacy list, and upon discussion with patient.  Admitted to the hospital on 12/22/20 due to Lower left leg pain. Discharge date was 12/22/20. Discharged from [facility]    New?Medications Started at [facility] Discharge:?? -started none  Medication Changes at Hospital Discharge: -Changed none  Medications Discontinued at Hospital Discharge: -Stopped none  Medications that remain the same after Hospital Discharge:??  -All other medications will remain the same.    Medications: Outpatient Encounter Medications as of 01/11/2021  Medication Sig   acetaminophen (TYLENOL) 500 MG tablet Take 1,000 mg by mouth every 6 (six) hours as needed.   amLODipine (NORVASC) 10 MG tablet Take 1 tablet (10 mg total) by mouth daily.   Ascorbic Acid (VITAMIN C) 500 MG CHEW Chew by mouth daily.   aspirin EC 81 MG tablet Take 1 tablet (81 mg total) by mouth daily.   Blood Glucose Monitoring Suppl (TRUE METRIX AIR GLUCOSE METER) w/Device KIT USE TO CHECK BLOOD SUGAR ONCE DAILY AND AS NEEDED.  DX CODE E11.9   Cholecalciferol (VITAMIN D) 50 MCG (2000 UT) CAPS Take 2,000 Units by mouth daily.    famotidine (PEPCID) 20 MG tablet Take 20 mg by mouth at  bedtime as needed for heartburn or indigestion.   glucose blood (TRUE METRIX BLOOD GLUCOSE TEST) test strip USE TO CHECK BLOOD SUGAR ONCE A DAY OR AS NEEDED.  DX CODE: E11.9   hyoscyamine (LEVSIN SL) 0.125 MG SL tablet Place 1 tablet (0.125 mg total) under the tongue every 4 (four) hours as needed.   isosorbide mononitrate (IMDUR) 60 MG 24 hr tablet Take 1.5 tablets (90 mg total) by mouth daily.   ketorolac (TORADOL) 10 MG tablet Take 1 tablet (10 mg total) by mouth every 6 (six) hours as needed.   losartan (COZAAR) 100 MG tablet Take 1 tablet (100 mg total) by mouth daily.   magnesium oxide (MAG-OX) 400 MG tablet Take 400 mg by mouth 2 (two) times daily.   metoprolol succinate (TOPROL-XL) 50 MG 24 hr tablet Take 1.5 tablets (75 mg total) by mouth at bedtime. Take with or immediately following a meal.   Multiple Vitamins-Minerals (ZINC PO) Take by mouth daily.   nitroGLYCERIN (NITROSTAT) 0.4 MG SL tablet Place 1 tablet (0.4 mg total) under the tongue every 5 (five) minutes as needed for chest pain.   omeprazole (PRILOSEC) 20 MG capsule TAKE 1 CAPSULE EVERY DAY AS NEEDED   ondansetron (ZOFRAN ODT) 4 MG disintegrating tablet Take 1 tablet (4 mg total) by mouth every 6 (six) hours as needed for nausea or vomiting.   potassium chloride SA (KLOR-CON) 20 MEQ tablet Take 0.5 tablets (10 mEq total) by mouth daily.   pramipexole (MIRAPEX) 0.25 MG tablet Take 1 tablet (0.25 mg total) by mouth 2 (two) times daily.  predniSONE (DELTASONE) 5 MG tablet Take 6 pills for first day, 5 pills second day, 4 pills third day, 3 pills fourth day, 2 pills the fifth day, and 1 pill sixth day.   promethazine (PHENERGAN) 12.5 MG tablet Take 1 tablet (12.5 mg total) by mouth every 8 (eight) hours as needed for nausea or vomiting.   simvastatin (ZOCOR) 20 MG tablet TAKE 1 TABLET AT BEDTIME   spironolactone (ALDACTONE) 25 MG tablet Take 0.5 tablets (12.5 mg total) by mouth 3 (three) times a week.   traMADol (ULTRAM) 50 MG  tablet Take 50 mg by mouth every 12 (twelve) hours as needed for severe pain.   TRUEplus Lancets 33G MISC USE TO CHECK BLOOD SUGAR ONCE DAILY AND AS NEEDED.  DX CODE: E11.9   No facility-administered encounter medications on file as of 01/11/2021.   Reviewed chart prior to disease state call. Spoke with patient regarding BP  Recent Office Vitals: BP Readings from Last 3 Encounters:  01/05/21 (!) 144/72  01/04/21 (!) 146/72  12/22/20 (!) 154/63   Pulse Readings from Last 3 Encounters:  12/22/20 (!) 57  12/14/20 (!) 59  12/01/20 84    Wt Readings from Last 3 Encounters:  01/05/21 159 lb (72.1 kg)  01/04/21 159 lb 12.8 oz (72.5 kg)  12/22/20 160 lb (72.6 kg)     Kidney Function Lab Results  Component Value Date/Time   CREATININE 0.89 11/24/2020 12:36 PM   CREATININE 1.40 (H) 11/09/2020 12:16 PM   CREATININE 0.95 11/10/2013 03:28 AM   CREATININE 1.03 01/17/2012 11:03 AM   GFR 56.01 (L) 07/14/2020 09:21 AM   GFRNONAA >60 11/24/2020 12:36 PM   GFRAA 57 (L) 08/19/2020 01:51 PM    BMP Latest Ref Rng & Units 11/24/2020 11/09/2020 10/27/2020  Glucose 70 - 99 mg/dL 175(H) 135(H) 165(H)  BUN 8 - 23 mg/dL 22 29(H) 23  Creatinine 0.44 - 1.00 mg/dL 0.89 1.40(H) 1.00  BUN/Creat Ratio 12 - 28 - - -  Sodium 135 - 145 mmol/L 137 134(L) 136  Potassium 3.5 - 5.1 mmol/L 3.9 4.9 4.6  Chloride 98 - 111 mmol/L 103 105 104  CO2 22 - 32 mmol/L 26 - -  Calcium 8.9 - 10.3 mg/dL 9.6 - -    Current antihypertensive regimen:  Metoprolol Succinate 50mg  - take 1.5 tablets = 75mg  daily (increased recently by cardiologist) Amlodipine 10mg  daily Losartan 100mg  daily Spironolactone 25mg  - take 0.5 tablet = 12.5mg  on Mondays, Wednesdays and Fridays Potassium chloride 32mEq - take 0.5 tablet = 10 mEq once a day  How often are you checking your Blood Pressure? infrequently patient advised to monitor blood pressure at least 1-2 times a week.  Current home BP readings: 12/30/20 149/72 01/11/21- 155/70  What  recent interventions/DTPs have been made by any provider to improve Blood Pressure control since last CPP Visit: None noted  Any recent hospitalizations or ED visits since last visit with CPP? Yes  What diet changes have been made to improve Blood Pressure Control?  Patient states she is limiting salt intake.   What exercise is being done to improve your Blood Pressure Control?  Patient states she is not exercising due to a recent stress fracture.  Adherence Review: Is the patient currently on ACE/ARB medication? Yes Does the patient have >5 day gap between last estimated fill dates? No   Star Rating Drugs: Losartan 100 mg last filled 10/31/20 90 DS Simvastatin 20 mg last filled 11/14/20 90 DS  [facility] Clinical Pharmacist Assistant  [phone]  

## 2021-01-16 ENCOUNTER — Ambulatory Visit (INDEPENDENT_AMBULATORY_CARE_PROVIDER_SITE_OTHER): Payer: Medicare HMO | Admitting: Medical

## 2021-01-16 ENCOUNTER — Ambulatory Visit (HOSPITAL_BASED_OUTPATIENT_CLINIC_OR_DEPARTMENT_OTHER)
Admission: RE | Admit: 2021-01-16 | Discharge: 2021-01-16 | Disposition: A | Discharge status: Home / Self Care | Payer: Medicare HMO | Source: Ambulatory Visit | Attending: Family Medicine | Admitting: Family Medicine

## 2021-01-16 ENCOUNTER — Other Ambulatory Visit: Payer: Self-pay

## 2021-01-16 VITALS — BP 140/70 | HR 70 | Resp 18 | Ht 62.0 in | Wt 161.0 lb

## 2021-01-16 DIAGNOSIS — T148XXA Other injury of unspecified body region, initial encounter: Secondary | ICD-10-CM

## 2021-01-16 DIAGNOSIS — I10 Essential (primary) hypertension: Secondary | ICD-10-CM

## 2021-01-16 DIAGNOSIS — R5383 Other fatigue: Secondary | ICD-10-CM | POA: Diagnosis not present

## 2021-01-16 DIAGNOSIS — Z13820 Encounter for screening for osteoporosis: Secondary | ICD-10-CM | POA: Insufficient documentation

## 2021-01-16 DIAGNOSIS — M85851 Other specified disorders of bone density and structure, right thigh: Secondary | ICD-10-CM | POA: Diagnosis not present

## 2021-01-16 DIAGNOSIS — Z780 Asymptomatic menopausal state: Secondary | ICD-10-CM | POA: Diagnosis not present

## 2021-01-16 DIAGNOSIS — R739 Hyperglycemia, unspecified: Secondary | ICD-10-CM

## 2021-01-16 NOTE — Patient Instructions (Addendum)
For intermittent easy bruising future lab/cbc to check your platelets.(Labs to be done at [facility] location)  For fatigue follow hb, hct and iron level.   For elevated sugar get  future cmp and a1c.  For  htn continue current 5 med regimen prescribed by pcp and cardiologist.  Follow up as regularly scheduled with pcp or sooner if needed.

## 2021-01-16 NOTE — Progress Notes (Signed)
Subjective:    Patient ID: Heather [name], female    DOB: 06-Aug-1934, 86 y.o.   MRN: [id number]  HPI   Pt in for question about random bruising on and off on various area of her arms and legs. More common on forearms. But occasionally does occur on rt lower ext/pretibial area. Whenever she gets bruise takes about 7 days to heal. No nose bleeds.  Pt does have elevate sugar on review of labs.   Pt has htn. She is on 4 med regimen plus imdur that can effect bp.    Review of Systems  Constitutional:  Positive for fatigue. Negative for chills and fever.  Respiratory:  Negative for cough, chest tightness, shortness of breath and wheezing.   Cardiovascular:  Negative for chest pain and palpitations.  Gastrointestinal:  Negative for abdominal distention and abdominal pain.  Musculoskeletal:  Negative for myalgias.  Skin:  Negative for rash.  Neurological:  Negative for dizziness, seizures, syncope and light-headedness.  Hematological:  Negative for adenopathy. Does not bruise/bleed easily.  Psychiatric/Behavioral:  Negative for agitation, decreased concentration and dysphoric mood.    Past Medical History:  Diagnosis Date   Chronic stable angina [facility])    Coronary artery disease cardiologist--- dr Burt Knack   hx NSTEMI  w/ cardiac cath 03-16-2005 PCI with DES to LCx;   cardiac cath 06-27-2005  PCI with DES to RCA with residual dx LAD manage medcially;  lexiscan 01-26-20211 normal no ischemia, ef 70%;  stress echo w/ dobutamine 10-24-2011 negative ishcmeia , normal ef // Myoview 2/22: EF 67, no ischemia or infarction, no TID, low risk    Diabetes mellitus type 2, diet-controlled ([place])    followed by pcp  (10-19-2020  pt stated checks daily in am,  fasting blood surgar--- 115--120s)   DOE (dyspnea on exertion)    per pt "when I over do",  ok with household chores   Echocardiogram 08/2020    Echocardiogram 2/22: EF 55-60, no RWMA, mild LVH, Gr 2 DD, GLS-21.7%, normal RVSF, trivial MR, RVSP  39.5   Edema of right lower extremity    GERD (gastroesophageal reflux disease)    Hiatal hernia    recurrence,  hx HH repair 1989   History of cervical cancer    s/p  vaginal hysterectomy   History of DVT of lower extremity 2016   11-29-2014 post op right TKA of right lower extremity and completed xarelto    History of esophageal stricture    hx s/p dilatation's   History of gastric ulcer 2005 approx.   History of palpitations 2010   event monitor 07-07-2009 showed NSR w/ freq. SVT ectopies with short runs, rare PVCs   History of TIA (transient ischemic attack) 06/1999   12-15-2019  per pt had several TIA between 12/ 2000 to 02/ 2001 , was sent to specialist '@[facility]' , had test that was normal (10-19-2020 pt stated no TIAs since ) but has residual of essential tremors of right arm/ hand   Hypertension    Intermittent palpitations    IT band syndrome    Migraine    "ice pick headche lasts about 30 seconds"   Mixed hyperlipidemia    Mixed incontinence urge and stress    Multiple thyroid nodules    followed by pcp---   ultrasound 11-22-2014 no bx   (12-15-2019 per pt had a endocrinologist and was told did not need bx)   OA (osteoarthritis)    knees, elbow, hip, ankles   Occasional tremors  right arm/ hand  s/p TIA residual 2000   Osteoporosis    taking vitamin d   Right bundle branch block (RBBB) with left anterior fascicular block (LAFB)    RLS (restless legs syndrome)    S/P drug eluting coronary stent placement 2006   03-16-2005  PCI x1 DES to LCx;   06-27-2005  PCI x1 DES to RCA   Urinary retention    post op sling prodecure on 10-27-2020, has foley cathether     Social History   Socioeconomic History   Marital status: Widowed    Spouse name: Not on file   Number of children: 3   Years of education: 48   Highest education level: Not on file  Occupational History   Occupation: works partime in Office manager: RETIRED    Comment: retired  Tobacco Use   Smoking  status: Never   Smokeless tobacco: Never  Vaping Use   Vaping Use: Never used  Substance and Sexual Activity   Alcohol use: No    Alcohol/week: 0.0 standard drinks   Drug use: Never   Sexual activity: Not Currently    Birth control/protection: Surgical    Comment: lives alone  Other Topics Concern   Not on file  Social History Narrative   Lives with husband, Caffeine use- Half Caffeine)- 2 cups daily.  3 children living, one passed away.   Education: HS. Business college.  Retired.    Social Determinants of Health   Financial Resource Strain: Low Risk    Difficulty of Paying Living Expenses: Not hard at all  Food Insecurity: Not on file  Transportation Needs: Not on file  Physical Activity: Inactive   Days of Exercise per Week: 0 days   Minutes of Exercise per Session: 0 min  Stress: Not on file  Social Connections: Not on file  Intimate Partner Violence: Not on file    Past Surgical History:  Procedure Laterality Date   ANTERIOR AND POSTERIOR REPAIR N/A 12/22/2019   Procedure: ANTERIOR (CYSTOCELE)  REPAIR;  Surgeon: Janyth Contes, MD;  Location: [place];  Service: Gynecology;  Laterality: N/A;   ANTERIOR AND POSTERIOR REPAIR WITH SACROSPINOUS FIXATION N/A 10/27/2020   Procedure: SACROSPINOUS LIGAMENT FIXATION;  Surgeon: Jaquita Folds, MD;  Location[facility];  Service: Gynecology;  Laterality: N/A;   BLADDER SUSPENSION N/A 10/27/2020   Procedure: TRANSVAGINAL TAPE (TVT) PROCEDURE;  Surgeon: Jaquita Folds, MD;  Location: [facility];  Service: Gynecology;  Laterality: N/A;   CATARACT EXTRACTION W/ INTRAOCULAR LENS  IMPLANT, BILATERAL  2015   COLONOSCOPY  last one ?   CORONARY ANGIOPLASTY WITH STENT PLACEMENT  03-16-2005   dr Lia Foyer   PCI and DES x1 to LCx   CORONARY ANGIOPLASTY WITH STENT PLACEMENT  06-27-2005  dr Lia Foyer   PCI and DES x1 to RCA with residual disease LAD 70-80% to manage medically    CYSTOSCOPY N/A 10/27/2020   Procedure: CYSTOSCOPY;  Surgeon: Jaquita Folds, MD;  Location: [facility];  Service: Gynecology;  Laterality: N/A;   CYSTOSCOPY N/A 11/09/2020   Procedure: CYSTOSCOPY;  Surgeon: Jaquita Folds, MD;  Location: [facility];  Service: Gynecology;  Laterality: N/A;   FOOT SURGERY Left 1990s   left foot stress fracture repair, per pt no hardware   HIATAL HERNIA REPAIR  1989   KNEE ARTHROSCOPY Bilateral right ?/   left x2 , last one 09-12-2009 @ [facility]   PUBOVAGINAL  SLING N/A 11/09/2020   Procedure: REVISION OF PUBO-VAGINAL SLING;  Surgeon: Jaquita Folds, MD;  Location: [facility];  Service: Gynecology;  Laterality: N/A;   RECTOCELE REPAIR N/A 04/20/2020   Procedure: POSTERIOR REPAIR (RECTOCELE);  Surgeon: Janyth Contes, MD;  Location: [facility];  Service: Gynecology;  Laterality: N/A;   RECTOCELE REPAIR N/A 10/27/2020   Procedure: POSTERIOR REPAIR (RECTOCELE);  Surgeon: Jaquita Folds, MD;  Location: [place];  Service: Gynecology;  Laterality: N/A;  total time requested for all procedures is 2 hours   TOTAL KNEE ARTHROPLASTY  11/12/2011   Procedure: TOTAL KNEE ARTHROPLASTY;  Surgeon: Lorn Junes, MD;  Location: [place];  Service: Orthopedics;  Laterality: Left;  Dr Noemi Chapel wants 90 minutes for this case   TOTAL KNEE ARTHROPLASTY Right 11/29/2014   Procedure: RIGHT TOTAL KNEE ARTHROPLASTY;  Surgeon: Vickey Huger, MD;  Location: [place];  Service: Orthopedics;  Laterality: Right;   UPPER GASTROINTESTINAL ENDOSCOPY  last one 04-25-2017   with dilatation esophageal stricture and savary dilatation   VAGINAL HYSTERECTOMY  1988    no ovaries removed for bleeeding    Family History  Problem Relation Age of Onset   Stroke Father        family hx of M 1st degree relative <50   Coronary artery disease Mother    Heart disease Mother    Depression Brother    Stroke  Brother    Diabetes Brother    Cancer Brother        bladder with mets   Diabetes Daughter        borderline   Hypertension Daughter    Arthritis Other        family hx of   Hypertension Other        family hx of   Other Other        family hx of cardiovascular disorder   Thyroid disease Daughter    Breast cancer Neg Hx    Colon cancer Neg Hx    Anesthesia problems Neg Hx    Hypotension Neg Hx    Malignant hyperthermia Neg Hx    Pseudochol deficiency Neg Hx    Colon polyps Neg Hx    Esophageal cancer Neg Hx    Rectal cancer Neg Hx    Stomach cancer Neg Hx     Allergies  Allergen Reactions   Baclofen Other (See Comments)    Hyperactivity    Nitrofurantoin Rash    Current Outpatient Medications on File Prior to Visit  Medication Sig Dispense Refill   acetaminophen (TYLENOL) 500 MG tablet Take 1,000 mg by mouth every 6 (six) hours as needed.     amLODipine (NORVASC) 10 MG tablet Take 1 tablet (10 mg total) by mouth daily. 90 tablet 1   Ascorbic Acid (VITAMIN C) 500 MG CHEW Chew by mouth daily.     aspirin EC 81 MG tablet Take 1 tablet (81 mg total) by mouth daily.     Blood Glucose Monitoring Suppl (TRUE METRIX AIR GLUCOSE METER) w/Device KIT USE TO CHECK BLOOD SUGAR ONCE DAILY AND AS NEEDED.  DX CODE E11.9 1 kit 0   Cholecalciferol (VITAMIN D) 50 MCG (2000 UT) CAPS Take 2,000 Units by mouth daily.      famotidine (PEPCID) 20 MG tablet Take 20 mg by mouth at bedtime as needed for heartburn or indigestion.     glucose blood (TRUE METRIX BLOOD GLUCOSE TEST) test strip USE TO CHECK  BLOOD SUGAR ONCE A DAY OR AS NEEDED.  DX CODE: E11.9 200 each 1   hyoscyamine (LEVSIN SL) 0.125 MG SL tablet Place 1 tablet (0.125 mg total) under the tongue every 4 (four) hours as needed. 30 tablet 0   isosorbide mononitrate (IMDUR) 60 MG 24 hr tablet Take 1.5 tablets (90 mg total) by mouth daily. 135 tablet 3   ketorolac (TORADOL) 10 MG tablet Take 1 tablet (10 mg total) by mouth every 6 (six)  hours as needed. 20 tablet 0   losartan (COZAAR) 100 MG tablet Take 1 tablet (100 mg total) by mouth daily. 90 tablet 0   magnesium oxide (MAG-OX) 400 MG tablet Take 400 mg by mouth 2 (two) times daily.     metoprolol succinate (TOPROL-XL) 50 MG 24 hr tablet Take 1.5 tablets (75 mg total) by mouth at bedtime. Take with or immediately following a meal. 135 tablet 3   Multiple Vitamins-Minerals (ZINC PO) Take by mouth daily.     nitroGLYCERIN (NITROSTAT) 0.4 MG SL tablet Place 1 tablet (0.4 mg total) under the tongue every 5 (five) minutes as needed for chest pain. 25 tablet 11   omeprazole (PRILOSEC) 20 MG capsule TAKE 1 CAPSULE EVERY DAY AS NEEDED 90 capsule 0   ondansetron (ZOFRAN ODT) 4 MG disintegrating tablet Take 1 tablet (4 mg total) by mouth every 6 (six) hours as needed for nausea or vomiting. 30 tablet 0   potassium chloride SA (KLOR-CON) 20 MEQ tablet Take 0.5 tablets (10 mEq total) by mouth daily. 45 tablet 2   pramipexole (MIRAPEX) 0.25 MG tablet Take 1 tablet (0.25 mg total) by mouth 2 (two) times daily. 180 tablet 1   promethazine (PHENERGAN) 12.5 MG tablet Take 1 tablet (12.5 mg total) by mouth every 8 (eight) hours as needed for nausea or vomiting. 30 tablet 0   simvastatin (ZOCOR) 20 MG tablet TAKE 1 TABLET AT BEDTIME 90 tablet 2   spironolactone (ALDACTONE) 25 MG tablet Take 0.5 tablets (12.5 mg total) by mouth 3 (three) times a week. 90 tablet 3   traMADol (ULTRAM) 50 MG tablet Take 50 mg by mouth every 12 (twelve) hours as needed for severe pain.     TRUEplus Lancets 33G MISC USE TO CHECK BLOOD SUGAR ONCE DAILY AND AS NEEDED.  DX CODE: E11.9 200 each 1   predniSONE (DELTASONE) 5 MG tablet Take 6 pills for first day, 5 pills second day, 4 pills third day, 3 pills fourth day, 2 pills the fifth day, and 1 pill sixth day. 21 tablet 0   No current facility-administered medications on file prior to visit.    BP 140/70   Pulse 70   Resp 18   Ht '5\' 2"'  (1.575 m)   Wt 161 lb (73 kg)    LMP  (LMP Unknown)   SpO2 95%   BMI 29.45 kg/m        Objective:   Physical Exam  General Mental Status- Alert. General Appearance- Not in acute distress.   Skin General: Color- Normal Color. Moisture- Normal Moisture.  Neck Carotid Arteries- Normal color. Moisture- Normal Moisture. No carotid bruits. No JVD.  Chest and Lung Exam Auscultation: Breath Sounds:-Normal.  Cardiovascular Auscultation:Rythm- Regular. Murmurs & Other Heart Sounds:Auscultation of the heart reveals- No Murmurs.  Abdomen Inspection:-Inspeection Normal. Palpation/Percussion:Note:No mass. Palpation and Percussion of the abdomen reveal- Non Tender, Non Distended + BS, no rebound or guarding.   Neurologic Cranial Nerve exam:- CN III-XII intact(No nystagmus), symmetric smile. Strength:- 5/5 equal  and symmetric strength both upper and lower extremities.       Assessment & Plan:   For intermittent easy bruising future lab/cbc to check your platelets.  For fatigue follow hb, hct and iron level.   For elevated sugar get cmp and a1c.  For  htn continue current 5 med regimen prescribed by pcp and cardiologist.  Follow up as regularly scheduled with pcp or sooner if needed.   [name], PA-C

## 2021-01-17 ENCOUNTER — Other Ambulatory Visit (INDEPENDENT_AMBULATORY_CARE_PROVIDER_SITE_OTHER): Payer: Medicare HMO

## 2021-01-17 DIAGNOSIS — R5383 Other fatigue: Secondary | ICD-10-CM | POA: Diagnosis not present

## 2021-01-17 DIAGNOSIS — R739 Hyperglycemia, unspecified: Secondary | ICD-10-CM | POA: Diagnosis not present

## 2021-01-17 DIAGNOSIS — T148XXA Other injury of unspecified body region, initial encounter: Secondary | ICD-10-CM | POA: Diagnosis not present

## 2021-01-17 LAB — COMPREHENSIVE METABOLIC PANEL
ALT: 20 U/L (ref 0–35)
AST: 17 U/L (ref 0–37)
Albumin: 4.1 g/dL (ref 3.5–5.2)
Alkaline Phosphatase: 81 U/L (ref 39–117)
BUN: 17 mg/dL (ref 6–23)
CO2: 27 mEq/L (ref 19–32)
Calcium: 9.3 mg/dL (ref 8.4–10.5)
Chloride: 102 mEq/L (ref 96–112)
Creatinine, Ser: 1.09 mg/dL (ref 0.40–1.20)
GFR: 46.73 mL/min — ABNORMAL LOW (ref >60.00)
Glucose, Bld: 217 mg/dL — ABNORMAL HIGH (ref 70–99)
Potassium: 4.4 mEq/L (ref 3.5–5.1)
Sodium: 137 mEq/L (ref 135–145)
Total Bilirubin: 0.7 mg/dL (ref 0.2–1.2)
Total Protein: 6.2 g/dL (ref 6.0–8.3)

## 2021-01-17 LAB — HEMOGLOBIN A1C: Hgb A1c MFr Bld: 7.9 % — ABNORMAL HIGH (ref 4.6–6.5)

## 2021-01-17 LAB — CBC WITH DIFFERENTIAL/PLATELET
Basophils Absolute: 0 10*3/uL (ref 0.0–0.1)
Basophils Relative: 0.5 % (ref 0.0–3.0)
Eosinophils Absolute: 0.2 10*3/uL (ref 0.0–0.7)
Eosinophils Relative: 2.3 % (ref 0.0–5.0)
HCT: 39.8 % (ref 36.0–46.0)
Hemoglobin: 13.4 g/dL (ref 12.0–15.0)
Lymphocytes Relative: 15.1 % (ref 12.0–46.0)
Lymphs Abs: 1.3 10*3/uL (ref 0.7–4.0)
MCHC: 33.6 g/dL (ref 30.0–36.0)
MCV: 91.3 fl (ref 78.0–100.0)
Monocytes Absolute: 0.8 10*3/uL (ref 0.1–1.0)
Monocytes Relative: 8.8 % (ref 3.0–12.0)
Neutro Abs: 6.4 10*3/uL (ref 1.4–7.7)
Neutrophils Relative %: 73.3 % (ref 43.0–77.0)
Platelets: 164 10*3/uL (ref 150.0–400.0)
RBC: 4.36 Mil/uL (ref 3.87–5.11)
RDW: 13 % (ref 11.5–15.5)
WBC: 8.7 10*3/uL (ref 4.0–10.5)

## 2021-01-17 LAB — IRON: Iron: 109 ug/dL (ref 42–145)

## 2021-01-17 NOTE — Addendum Note (Signed)
Addended by: Octavio Manns E on: 01/17/2021 11:33 AM   Modules accepted: Orders

## 2021-01-22 NOTE — Progress Notes (Signed)
[facility]  Date of Visit: 01/23/2021  History of Present Illness: Ms. Heather [name] is a 86 y.o. female scheduled today for a post-operative visit.   Surgery: s/p Exam under anesthesia, posterior repair, sacrospinous ligament fixation, midurethral sling and cystoscopy on 10/27/20 as well as a sling release on 11/09/20  She is having her gallbladder removed soon.  UTI symptoms? No  Pain? No  Vaginal bulge? No  Stress incontinence: Yes - with coughing Urgency/frequency: Yes - every hour at night- "pours out". During the day able to make it. Does sometimes drink before bed. Urge incontinence: Yes  Voiding dysfunction:  not sure she is emptying , has to lean forward and kind of dribbles out.  Bowel issues: No   Subjective Success: Do you usually have a bulge or something falling out that you can see or feel in the vaginal area? No  Retreatment Success: Any retreatment with surgery or pessary for any compartment? No    Medications: She has a current medication list which includes the following prescription(s): acetaminophen, amlodipine, vitamin c, aspirin ec, true metrix air glucose meter, vitamin d, diclofenac sodium, famotidine, true metrix blood glucose test, isosorbide mononitrate, ketorolac, losartan, magnesium oxide, metoprolol succinate, nitroglycerin, omeprazole, ondansetron, potassium chloride sa, pramipexole, simvastatin, spironolactone, tramadol, trueplus lancets 33g, zinc, mirabegron er, and prednisone.   Allergies: Patient is allergic to baclofen and nitrofurantoin.   Physical Exam: BP (!) 152/70   Pulse 65   LMP  (LMP Unknown)   Pelvic Examination: Vagina: Incisions healing well. Sutures are not present at incision line and there is not granulation tissue. No tenderness along the anterior or posterior vagina. No apical tenderness. No pelvic masses. No visible or palpable mesh.  POP-Q: POP-Q  -1.5                                            Aa   -1.5                                            Ba  -6                                              C   3                                            Gh  4                                            Pb  7                                            tvl   -2  Ap  -2                                            Bp                                                 D    ---------------------------------------------------------  Assessment and Plan:  1. Overactive bladder     - Can resume regular activity including exercise. - Discussed avoidance of heavy lifting and straining long term to reduce the risk of recurrence.  - For OAB symptoms, will start Myrbetriq 25mg  daily. She has used this in the past and it has worked well. She has also used oxybutynin and it did not work and caused dry mouth. She would like to start PTNS for her symptoms but wants to wait until she recovers from her gallbladder surgery- will plan to start a month after her surgery.   Jaquita Folds, MD   Time spent: I spent 30 minutes dedicated to the care of this patient on the date of this encounter to include pre-visit review of records, face-to-face time with the patient and post visit documentation and ordering medication/ testing.

## 2021-01-23 ENCOUNTER — Encounter: Payer: Self-pay | Admitting: Obstetrics and Gynecology

## 2021-01-23 ENCOUNTER — Ambulatory Visit (INDEPENDENT_AMBULATORY_CARE_PROVIDER_SITE_OTHER): Payer: Medicare HMO | Admitting: Obstetrics and Gynecology

## 2021-01-23 ENCOUNTER — Other Ambulatory Visit: Payer: Self-pay

## 2021-01-23 VITALS — BP 152/70 | HR 65

## 2021-01-23 DIAGNOSIS — N3281 Overactive bladder: Secondary | ICD-10-CM

## 2021-01-23 MED ORDER — MIRABEGRON ER 25 MG PO TB24: 25.0000 mg | ORAL_TABLET | Freq: Every day | ORAL | 5 refills | Status: DC

## 2021-01-23 MED ORDER — MIRABEGRON ER 25 MG PO TB24: 25.0000 mg | ORAL_TABLET | Freq: Every day | ORAL | 2 refills | Status: DC

## 2021-01-23 NOTE — Progress Notes (Addendum)
COVID Vaccine Completed: yes x3 Date COVID Vaccine completed: 07/21/19, 08/18/19 Has received booster: 05/06/20 COVID vaccine manufacturer: Pfizer    Moderna   Date of COVID positive in last 90 days: N/A  PCP - [name], MD Cardiologist -  [name], MD  Cardia Clearance noted dated 12/14/20 by [name].  Chest x-ray - 09/05/20 Epic EKG - 08/29/20 Epic Stress Test - 08/31/20 Epic ECHO - 08/26/20 Epic Cardiac Cath - per reports yes 2009 with 2 stents Pacemaker/ICD device last checked: N/A Spinal Cord Stimulator:N/A  Sleep Study - N/A CPAP -   Fasting Blood Sugar - 100-130s Checks Blood Sugar _2_ times a day  Blood Thinner Instructions: none given Aspirin Instructions: ASA 81 Last Dose: 01/24/21  Activity level: Can go up a flight of stairs and perform activities of daily living without stopping and without symptoms of chest pain or shortness of breath.       Anesthesia review: DM, HTN, coronary atherosclerosis, CAD, CVA  Patient denies shortness of breath, fever, cough and chest pain at PAT appointment   Patient verbalized understanding of instructions that were given to them at the PAT appointment. Patient was also instructed that they will need to review over the PAT instructions again at home before surgery.

## 2021-01-24 ENCOUNTER — Other Ambulatory Visit (HOSPITAL_COMMUNITY)
Admission: RE | Admit: 2021-01-24 | Discharge: 2021-01-24 | Disposition: A | Discharge status: Home / Self Care | Payer: Medicare HMO | Source: Ambulatory Visit | Attending: Surgery | Admitting: Surgery

## 2021-01-24 ENCOUNTER — Encounter (HOSPITAL_COMMUNITY)
Admission: RE | Admit: 2021-01-24 | Discharge: 2021-01-24 | Disposition: A | Discharge status: Home / Self Care | Payer: Medicare HMO | Source: Ambulatory Visit | Attending: Surgery | Admitting: Surgery

## 2021-01-24 ENCOUNTER — Encounter (HOSPITAL_COMMUNITY): Payer: Self-pay

## 2021-01-24 DIAGNOSIS — Z01812 Encounter for preprocedural laboratory examination: Secondary | ICD-10-CM | POA: Diagnosis not present

## 2021-01-24 DIAGNOSIS — Z7982 Long term (current) use of aspirin: Secondary | ICD-10-CM | POA: Diagnosis not present

## 2021-01-24 DIAGNOSIS — I4510 Unspecified right bundle-branch block: Secondary | ICD-10-CM | POA: Insufficient documentation

## 2021-01-24 DIAGNOSIS — K219 Gastro-esophageal reflux disease without esophagitis: Secondary | ICD-10-CM | POA: Diagnosis not present

## 2021-01-24 DIAGNOSIS — K432 Incisional hernia without obstruction or gangrene: Secondary | ICD-10-CM | POA: Diagnosis not present

## 2021-01-24 DIAGNOSIS — Z20822 Contact with and (suspected) exposure to covid-19: Secondary | ICD-10-CM | POA: Diagnosis not present

## 2021-01-24 DIAGNOSIS — Z7901 Long term (current) use of anticoagulants: Secondary | ICD-10-CM | POA: Insufficient documentation

## 2021-01-24 DIAGNOSIS — K8050 Calculus of bile duct without cholangitis or cholecystitis without obstruction: Secondary | ICD-10-CM | POA: Diagnosis not present

## 2021-01-24 DIAGNOSIS — Z7984 Long term (current) use of oral hypoglycemic drugs: Secondary | ICD-10-CM | POA: Diagnosis not present

## 2021-01-24 DIAGNOSIS — I2510 Atherosclerotic heart disease of native coronary artery without angina pectoris: Secondary | ICD-10-CM | POA: Diagnosis not present

## 2021-01-24 DIAGNOSIS — Z79899 Other long term (current) drug therapy: Secondary | ICD-10-CM | POA: Insufficient documentation

## 2021-01-24 DIAGNOSIS — I10 Essential (primary) hypertension: Secondary | ICD-10-CM | POA: Insufficient documentation

## 2021-01-24 LAB — SARS CORONAVIRUS 2 (TAT 6-24 HRS): SARS Coronavirus 2: NEGATIVE

## 2021-01-24 LAB — GLUCOSE, CAPILLARY: Glucose-Capillary: 157 mg/dL — ABNORMAL HIGH (ref 70–99)

## 2021-01-24 MED ORDER — BUPIVACAINE LIPOSOME 1.3 % IJ SUSP
20.0000 mL | Freq: Once | INTRAMUSCULAR | Status: DC
  Filled 2021-01-24: qty 20

## 2021-01-24 NOTE — Progress Notes (Signed)
Anesthesia Chart Review   Case: [id number] Date/Time: 01/25/21 1515   Procedures:      LAPAROSCOPIC CHOLECYSTECTOMY WITH INTRAOPERATIVE CHOLANGIOGRAM     POSSIBLE NEEDLE CORE BIOPSY OF LIVER     REPAIR OF INCISIONAL HERNIA   Anesthesia type: General   Pre-op diagnosis: SYMPTOMATIC BILIARY COLIC, PROBABLE CHRONIC CHOLECYSTITIS. INCISIONAL HERNIA   Location: [facility] ROOM 03 / [facility]   Surgeons: Michael Boston, MD       DISCUSSION:86 y.o. never smoker with h/o GERD, HTN, RBBB, CAD (DES), DM II (7.9), symptomatic biliary colic, probable chronic cholecystitis, incisional hernia scheduled for above procedure 01/25/2021 with Dr. Michael Boston.   Pt last seen by cardiology 12/14/2020. Per OV note, "She sees general surgery later this month.  She has gallstones.  It sounds like she may be needing a cholecystectomy.  Her perioperative risk of major cardiac event is high according to the revised cardiac risk index at 11%.  However, she had a recent nuclear stress test which was low risk.  She can achieve 4 METS and is not having unstable symptoms.  Therefore, she can proceed with gallbladder surgery if needed at acceptable risk.  Ideally, she should remain on aspirin without interruption.  However, if the bleeding risk is too great, her aspirin could be held for 5-7 days and then resumed postop as soon as it is felt to be safe."  Anticipate pt can proceed with planned procedure barring acute status change.   VS: BP (!) 155/66   Pulse 60   Temp 36.6 C (Oral)   Resp 14   Ht '5\' 2"'  (1.575 m)   LMP  (LMP Unknown)   SpO2 97%   BMI 29.45 kg/m   PROVIDERS: Mosie Lukes, MD is PCP   [name], MD is Cardiologist  LABS: Labs reviewed: Acceptable for surgery. (all labs ordered are listed, but only abnormal results are displayed)  Labs Reviewed  GLUCOSE, CAPILLARY - Abnormal; Notable for the following components:      Result Value   Glucose-Capillary 157 (*)    All other components within normal  limits     IMAGES:   EKG: 08/29/2020 Rate 73 bpm  Sinus rhythm  RBBB and LAFB Left ventricular hypertrophy ST elevation, consider inferior injury  CV: Echo 08/26/2020  1. Left ventricular ejection fraction, by estimation, is 55 to 60%. Left  ventricular ejection fraction by 3D volume is 61 %. The left ventricle has  normal function. The left ventricle has no regional wall motion  abnormalities. There is mild concentric  left ventricular hypertrophy. Left ventricular diastolic parameters are  consistent with Grade II diastolic dysfunction (pseudonormalization). The  average left ventricular global longitudinal strain is -21.7 %. The global  longitudinal strain is normal.   2. Right ventricular systolic function is normal. The right ventricular  size is normal. There is mildly elevated pulmonary artery systolic  pressure.   3. The mitral valve is abnormal. Trivial mitral valve regurgitation. No  evidence of mitral stenosis.   4. The aortic valve is tricuspid. There is mild calcification of the  aortic valve. There is mild thickening of the aortic valve. Aortic valve  regurgitation is not visualized. No aortic stenosis is present.   5. The inferior vena cava is normal in size with greater than 50%  respiratory variability, suggesting right atrial pressure of 3 mmHg.   6. Mechanism of mild tricuspid regurgitation suggestive of prolapse. In  the setting eccentric jet, study may underestimate the severity of the  regurgitation.M   Stress Test 08/31/20 The left ventricular ejection fraction is hyperdynamic (>65%). Nuclear stress EF: 67%. There was no ST segment deviation noted during stress. The study is normal. This is a low risk study.   No ischemia or infarction on perfusion images. Past Medical History:  Diagnosis Date   Chronic stable angina [facility])    Coronary artery disease cardiologist--- dr Burt Knack   hx NSTEMI  w/ cardiac cath 03-16-2005 PCI with DES to LCx;   cardiac  cath 06-27-2005  PCI with DES to RCA with residual dx LAD manage medcially;  lexiscan 01-26-20211 normal no ischemia, ef 70%;  stress echo w/ dobutamine 10-24-2011 negative ishcmeia , normal ef // Myoview 2/22: EF 67, no ischemia or infarction, no TID, low risk    Diabetes mellitus type 2, diet-controlled ([place])    followed by pcp  (10-19-2020  pt stated checks daily in am,  fasting blood surgar--- 115--120s)   DOE (dyspnea on exertion)    per pt "when I over do",  ok with household chores   Echocardiogram 08/2020    Echocardiogram 2/22: EF 55-60, no RWMA, mild LVH, Gr 2 DD, GLS-21.7%, normal RVSF, trivial MR, RVSP 39.5   Edema of right lower extremity    GERD (gastroesophageal reflux disease)    Hiatal hernia    recurrence,  hx HH repair 1989   History of cervical cancer    s/p  vaginal hysterectomy   History of DVT of lower extremity 2016   11-29-2014 post op right TKA of right lower extremity and completed xarelto    History of esophageal stricture    hx s/p dilatation's   History of gastric ulcer 2005 approx.   History of palpitations 2010   event monitor 07-07-2009 showed NSR w/ freq. SVT ectopies with short runs, rare PVCs   History of TIA (transient ischemic attack) 06/1999   12-15-2019  per pt had several TIA between 12/ 2000 to 02/ 2001 , was sent to specialist '@[facility]' , had test that was normal (10-19-2020 pt stated no TIAs since ) but has residual of essential tremors of right arm/ hand   Hypertension    Intermittent palpitations    IT band syndrome    Migraine    "ice pick headche lasts about 30 seconds"   Mixed hyperlipidemia    Mixed incontinence urge and stress    Multiple thyroid nodules    followed by pcp---   ultrasound 11-22-2014 no bx   (12-15-2019 per pt had a endocrinologist and was told did not need bx)   OA (osteoarthritis)    knees, elbow, hip, ankles   Occasional tremors    right arm/ hand  s/p TIA residual 2000   Osteoporosis    taking vitamin d   Right  bundle branch block (RBBB) with left anterior fascicular block (LAFB)    RLS (restless legs syndrome)    S/P drug eluting coronary stent placement 2006   03-16-2005  PCI x1 DES to LCx;   06-27-2005  PCI x1 DES to RCA   Urinary retention    post op sling prodecure on 10-27-2020, has foley cathether    Past Surgical History:  Procedure Laterality Date   ANTERIOR AND POSTERIOR REPAIR N/A 12/22/2019   Procedure: ANTERIOR (CYSTOCELE)  REPAIR;  Surgeon: Janyth Contes, MD;  Location: [place];  Service: Gynecology;  Laterality: N/A;   ANTERIOR AND POSTERIOR REPAIR WITH SACROSPINOUS FIXATION N/A 10/27/2020   Procedure: SACROSPINOUS LIGAMENT FIXATION;  Surgeon: Sherlene Shams  N, MD;  Location: [place];  Service: Gynecology;  Laterality: N/A;   BLADDER SUSPENSION N/A 10/27/2020   Procedure: TRANSVAGINAL TAPE (TVT) PROCEDURE;  Surgeon: Jaquita Folds, MD;  Location: [facility];  Service: Gynecology;  Laterality: N/A;   CATARACT EXTRACTION W/ INTRAOCULAR LENS  IMPLANT, BILATERAL  2015   COLONOSCOPY  last one ?   CORONARY ANGIOPLASTY WITH STENT PLACEMENT  03-16-2005   dr Lia Foyer   PCI and DES x1 to LCx   CORONARY ANGIOPLASTY WITH STENT PLACEMENT  06-27-2005  dr Lia Foyer   PCI and DES x1 to RCA with residual disease LAD 70-80% to manage medically   CYSTOSCOPY N/A 10/27/2020   Procedure: CYSTOSCOPY;  Surgeon: Jaquita Folds, MD;  Location: [facility];  Service: Gynecology;  Laterality: N/A;   CYSTOSCOPY N/A 11/09/2020   Procedure: CYSTOSCOPY;  Surgeon: Jaquita Folds, MD;  Location: [facility];  Service: Gynecology;  Laterality: N/A;   FOOT SURGERY Left 1990s   left foot stress fracture repair, per pt no hardware   HIATAL HERNIA REPAIR  1989   KNEE ARTHROSCOPY Bilateral right ?/   left x2 , last one 09-12-2009 @ [place]   PUBOVAGINAL SLING N/A 11/09/2020   Procedure: [place];  Surgeon: Jaquita Folds, MD;  Location: [facility];  Service: Gynecology;  Laterality: N/A;   RECTOCELE REPAIR N/A 04/20/2020   Procedure: POSTERIOR REPAIR (RECTOCELE);  Surgeon: Janyth Contes, MD;  Location: [facility];  Service: Gynecology;  Laterality: N/A;   RECTOCELE REPAIR N/A 10/27/2020   Procedure: POSTERIOR REPAIR (RECTOCELE);  Surgeon: Jaquita Folds, MD;  Location: [facility];  Service: Gynecology;  Laterality: N/A;  total time requested for all procedures is 2 hours   TOTAL KNEE ARTHROPLASTY  11/12/2011   Procedure: TOTAL KNEE ARTHROPLASTY;  Surgeon: Lorn Junes, MD;  Location: [place];  Service: Orthopedics;  Laterality: Left;  Dr Noemi Chapel wants 90 minutes for this case   TOTAL KNEE ARTHROPLASTY Right 11/29/2014   Procedure: RIGHT TOTAL KNEE ARTHROPLASTY;  Surgeon: Vickey Huger, MD;  Location: [place];  Service: Orthopedics;  Laterality: Right;   UPPER GASTROINTESTINAL ENDOSCOPY  last one 04-25-2017   with dilatation esophageal stricture and savary dilatation   VAGINAL HYSTERECTOMY  1988    no ovaries removed for bleeeding    MEDICATIONS:  acetaminophen (TYLENOL) 500 MG tablet   amLODipine (NORVASC) 10 MG tablet   Ascorbic Acid (VITAMIN C) 500 MG CHEW   aspirin EC 81 MG tablet   Blood Glucose Monitoring Suppl (TRUE METRIX AIR GLUCOSE METER) w/Device KIT   Cholecalciferol (VITAMIN D) 50 MCG (2000 UT) CAPS   diclofenac Sodium (VOLTAREN) 1 % GEL   famotidine (PEPCID) 20 MG tablet   glucose blood (TRUE METRIX BLOOD GLUCOSE TEST) test strip   isosorbide mononitrate (IMDUR) 60 MG 24 hr tablet   ketorolac (TORADOL) 10 MG tablet   losartan (COZAAR) 100 MG tablet   magnesium oxide (MAG-OX) 400 MG tablet   metoprolol succinate (TOPROL-XL) 50 MG 24 hr tablet   mirabegron ER (MYRBETRIQ) 25 MG TB24 tablet   nitroGLYCERIN (NITROSTAT) 0.4 MG SL tablet   omeprazole (PRILOSEC) 20 MG capsule   ondansetron (ZOFRAN ODT)  4 MG disintegrating tablet   potassium chloride SA (KLOR-CON) 20 MEQ tablet   pramipexole (MIRAPEX) 0.25 MG tablet   predniSONE (DELTASONE) 5 MG tablet   simvastatin (ZOCOR) 20 MG tablet   spironolactone (ALDACTONE)  25 MG tablet   traMADol (ULTRAM) 50 MG tablet   TRUEplus Lancets 33G MISC   Zinc 50 MG TABS   No current facility-administered medications for this encounter.    [START ON 01/25/2021] bupivacaine liposome (EXPAREL) 1.3 % injection 266 mg     Konrad Felix, PA-C [facility]-Surgical Testing [phone]

## 2021-01-24 NOTE — Patient Instructions (Addendum)
DUE TO COVID-19 ONLY ONE VISITOR IS ALLOWED TO COME WITH YOU AND STAY IN THE WAITING ROOM ONLY DURING PRE OP AND PROCEDURE.   **NO VISITORS ARE ALLOWED IN THE SHORT STAY AREA OR RECOVERY ROOM!!**  IF YOU WILL BE ADMITTED INTO THE HOSPITAL YOU ARE ALLOWED ONLY TWO SUPPORT PEOPLE DURING VISITATION HOURS ONLY (10AM -8PM)   The support person(s) may change daily. The support person(s) must pass our screening, gel in and out, and wear a mask at all times, including in the patient's room. Patients must also wear a mask when staff or their support person are in the room.  No visitors under the age of 11. Any visitor under the age of 25 must be accompanied by an adult.    COVID SWAB TESTING MUST BE COMPLETED ON:  01/24/21 @ 1:40 PM   [address] [place], [place] [address]   You are not required to quarantine, however you are required to wear a well-fitted mask when you are out and around people not in your household.  Hand Hygiene often Do NOT share personal items Notify your provider if you are in close contact with someone who has COVID or you develop fever 100.4 or greater, new onset of sneezing, cough, sore throat, shortness of breath or body aches.        Your procedure is scheduled on: 01/25/21   Report to [facility] Main  Entrance    Report to admitting at 1:30 PM   Call this number if you have problems the morning of surgery [phone]   Do not eat food :After Midnight.   May have liquids until 12:30 PM day of surgery  CLEAR LIQUID DIET  Foods Allowed                                                                     Foods Excluded  Water, Black Coffee and tea, regular and decaf               liquids that you cannot  Plain Jell-O in any flavor  (No red)                                     see through such as: Fruit ices (not with fruit pulp)                                             milk, soups, orange juice              Iced Popsicles (No red)                                                  All solid food                                   Apple juices  Sports drinks like Gatorade (No red) Lightly seasoned clear broth or consume(fat free) Sugar, honey syrup     The day of surgery:  Drink ONE (1) Pre-Surgery G2 by 12:30 pm the morning of surgery. Drink in one sitting. Do not sip.  This drink was given to you during your hospital  pre-op appointment visit. Nothing else to drink after completing the  Pre-Surgery G2.          If you have questions, please contact your surgeon's office.     Oral Hygiene is also important to reduce your risk of infection.                                    Remember - BRUSH YOUR TEETH THE MORNING OF SURGERY WITH YOUR REGULAR TOOTHPASTE   Take these medicines the morning of surgery with A SIP OF WATER: Acetaminophen, Omeprazole, Ondansetron, Tramadol.  How to Manage Your Diabetes Before and After Surgery  Why is it important to control my blood sugar before and after surgery? Improving blood sugar levels before and after surgery helps healing and can limit problems. A way of improving blood sugar control is eating a healthy diet by:  Eating less sugar and carbohydrates  Increasing activity/exercise  Talking with your doctor about reaching your blood sugar goals High blood sugars (greater than 180 mg/dL) can raise your risk of infections and slow your recovery, so you will need to focus on controlling your diabetes during the weeks before surgery. Make sure that the doctor who takes care of your diabetes knows about your planned surgery including the date and location.  How do I manage my blood sugar before surgery? Check your blood sugar at least 4 times a day, starting 2 days before surgery, to make sure that the level is not too high or low. Check your blood sugar the morning of your surgery when you wake up and every 2 hours until you get to the Short Stay unit. If your blood sugar is less than 70 mg/dL, you  will need to treat for low blood sugar: Do not take insulin. Treat a low blood sugar (less than 70 mg/dL) with  cup of clear juice (cranberry or apple), 4 glucose tablets, OR glucose gel. Recheck blood sugar in 15 minutes after treatment (to make sure it is greater than 70 mg/dL). If your blood sugar is not greater than 70 mg/dL on recheck, call [phone] for further instructions. Report your blood sugar to the short stay nurse when you get to Short Stay.  If you are admitted to the hospital after surgery: Your blood sugar will be checked by the staff and you will probably be given insulin after surgery (instead of oral diabetes medicines) to make sure you have good blood sugar levels. The goal for blood sugar control after surgery is 80-180 mg/dL.    Reviewed and Endorsed by [facility] Patient Education Committee, August 2015                               You may not have any metal on your body including hair pins, jewelry, and body piercing             Do not wear make-up, lotions, powders, perfumes, or deodorant  Do not wear nail polish including gel and S&S, artificial/acrylic nails, or any other type of  covering on natural nails including finger and toenails. If you have artificial nails, gel coating, etc. that needs to be removed by a nail salon please have this removed prior to surgery or surgery may need to be canceled/ delayed if the surgeon/ anesthesia feels like they are unable to be safely monitored.   Do not shave  48 hours prior to surgery.    Do not bring valuables to the hospital. [place].   Bring small overnight bag day of surgery.               Please read over the following fact sheets you were given: IF YOU HAVE QUESTIONS ABOUT YOUR PRE OP INSTRUCTIONS PLEASE CALL [phone]- [place] - Preparing for Surgery Before surgery, you can play an important role.  Because skin is not sterile, your skin  needs to be as free of germs as possible.  You can reduce the number of germs on your skin by washing with CHG (chlorahexidine gluconate) soap before surgery.  CHG is an antiseptic cleaner which kills germs and bonds with the skin to continue killing germs even after washing. Please DO NOT use if you have an allergy to CHG or antibacterial soaps.  If your skin becomes reddened/irritated stop using the CHG and inform your nurse when you arrive at Short Stay. Do not shave (including legs and underarms) for at least 48 hours prior to the first CHG shower.  You may shave your face/neck.  Please follow these instructions carefully:  1.  Shower with CHG Soap the night before surgery and the  morning of surgery.  2.  If you choose to wash your hair, wash your hair first as usual with your normal  shampoo.  3.  After you shampoo, rinse your hair and body thoroughly to remove the shampoo.                             4.  Use CHG as you would any other liquid soap.  You can apply chg directly to the skin and wash.  Gently with a scrungie or clean washcloth.  5.  Apply the CHG Soap to your body ONLY FROM THE NECK DOWN.   Do   not use on face/ open                           Wound or open sores. Avoid contact with eyes, ears mouth and   genitals (private parts).                       Wash face,  Genitals (private parts) with your normal soap.             6.  Wash thoroughly, paying special attention to the area where your    surgery  will be performed.  7.  Thoroughly rinse your body with warm water from the neck down.  8.  DO NOT shower/wash with your normal soap after using and rinsing off the CHG Soap.                9.  Pat yourself dry with a clean towel.            10.  Wear clean pajamas.  11.  Place clean sheets on your bed the night of your first shower and do not  sleep with pets. Day of Surgery : Do not apply any lotions/deodorants the morning of surgery.  Please wear clean clothes to the  hospital/surgery center.  FAILURE TO FOLLOW THESE INSTRUCTIONS MAY RESULT IN THE CANCELLATION OF YOUR SURGERY  PATIENT SIGNATURE_________________________________  NURSE SIGNATURE__________________________________  ________________________________________________________________________

## 2021-01-24 NOTE — Anesthesia Preprocedure Evaluation (Addendum)
Anesthesia Evaluation  Patient identified by MRN, date of birth, ID band Patient awake    Reviewed: Allergy & Precautions, NPO status , Patient's Chart, lab work & pertinent test results  Airway Mallampati: II  TM Distance: >3 FB     Dental   Pulmonary    breath sounds clear to auscultation       Cardiovascular hypertension, Pt. on medications + angina + CAD and + DOE  + dysrhythmias  Rhythm:Regular Rate:Normal  08/2020 Stress: LV is hyperdynamic (>65%). Nuclear stress EF: 67%. no ST segment deviation noted during stress. The study is normal. This is a low risk study.      Neuro/Psych  Headaches,    GI/Hepatic Neg liver ROS, hiatal hernia, GERD  ,  Endo/Other  diabetes  Renal/GU Renal disease     Musculoskeletal  (+) Arthritis ,   Abdominal   Peds  Hematology   Anesthesia Other Findings   Reproductive/Obstetrics                           Anesthesia Physical Anesthesia Plan  ASA: 3  Anesthesia Plan: General   Post-op Pain Management:    Induction: Intravenous  PONV Risk Score and Plan: 3 and Ondansetron, Dexamethasone and Midazolam  Airway Management Planned: Oral ETT  Additional Equipment:   Intra-op Plan:   Post-operative Plan: Possible Post-op intubation/ventilation  Informed Consent:     Dental advisory given  Plan Discussed with: CRNA and Anesthesiologist  Anesthesia Plan Comments: (See PAT note 01/24/2021, Konrad Felix, PA-C)       Anesthesia Quick Evaluation

## 2021-01-25 ENCOUNTER — Observation Stay (HOSPITAL_COMMUNITY)
Admission: RE | Admit: 2021-01-25 | Discharge: 2021-01-26 | Disposition: A | Discharge status: Home / Self Care | Payer: Medicare HMO | Attending: Surgery | Admitting: Surgery

## 2021-01-25 ENCOUNTER — Ambulatory Visit (HOSPITAL_COMMUNITY): Payer: Medicare HMO | Admitting: Certified Registered"

## 2021-01-25 ENCOUNTER — Ambulatory Visit (HOSPITAL_COMMUNITY): Payer: Medicare HMO

## 2021-01-25 ENCOUNTER — Ambulatory Visit (HOSPITAL_COMMUNITY): Payer: Medicare HMO | Admitting: Physician Assistant

## 2021-01-25 ENCOUNTER — Encounter (HOSPITAL_COMMUNITY)
Admission: RE | Disposition: A | Discharge status: Home / Self Care | Payer: Self-pay | Source: Home / Self Care | Attending: Surgery

## 2021-01-25 DIAGNOSIS — Z79899 Other long term (current) drug therapy: Secondary | ICD-10-CM | POA: Diagnosis not present

## 2021-01-25 DIAGNOSIS — R739 Hyperglycemia, unspecified: Secondary | ICD-10-CM

## 2021-01-25 DIAGNOSIS — I2510 Atherosclerotic heart disease of native coronary artery without angina pectoris: Secondary | ICD-10-CM | POA: Insufficient documentation

## 2021-01-25 DIAGNOSIS — K430 Incisional hernia with obstruction, without gangrene: Secondary | ICD-10-CM | POA: Diagnosis not present

## 2021-01-25 DIAGNOSIS — Z419 Encounter for procedure for purposes other than remedying health state, unspecified: Secondary | ICD-10-CM

## 2021-01-25 DIAGNOSIS — F418 Other specified anxiety disorders: Secondary | ICD-10-CM | POA: Diagnosis present

## 2021-01-25 DIAGNOSIS — K8010 Calculus of gallbladder with chronic cholecystitis without obstruction: Secondary | ICD-10-CM | POA: Diagnosis not present

## 2021-01-25 DIAGNOSIS — E119 Type 2 diabetes mellitus without complications: Secondary | ICD-10-CM | POA: Diagnosis not present

## 2021-01-25 DIAGNOSIS — K449 Diaphragmatic hernia without obstruction or gangrene: Secondary | ICD-10-CM | POA: Diagnosis not present

## 2021-01-25 DIAGNOSIS — K219 Gastro-esophageal reflux disease without esophagitis: Secondary | ICD-10-CM | POA: Diagnosis present

## 2021-01-25 DIAGNOSIS — I10 Essential (primary) hypertension: Secondary | ICD-10-CM | POA: Diagnosis not present

## 2021-01-25 DIAGNOSIS — R2681 Unsteadiness on feet: Secondary | ICD-10-CM | POA: Diagnosis not present

## 2021-01-25 DIAGNOSIS — N289 Disorder of kidney and ureter, unspecified: Secondary | ICD-10-CM

## 2021-01-25 DIAGNOSIS — K8020 Calculus of gallbladder without cholecystitis without obstruction: Secondary | ICD-10-CM | POA: Diagnosis present

## 2021-01-25 DIAGNOSIS — E876 Hypokalemia: Secondary | ICD-10-CM | POA: Diagnosis not present

## 2021-01-25 DIAGNOSIS — Z0389 Encounter for observation for other suspected diseases and conditions ruled out: Secondary | ICD-10-CM | POA: Diagnosis not present

## 2021-01-25 DIAGNOSIS — G4700 Insomnia, unspecified: Secondary | ICD-10-CM | POA: Diagnosis not present

## 2021-01-25 HISTORY — PX: INCISIONAL HERNIA REPAIR: SHX193

## 2021-01-25 HISTORY — PX: CHOLECYSTECTOMY: SHX55

## 2021-01-25 SURGERY — LAPAROSCOPIC CHOLECYSTECTOMY
Anesthesia: General | Site: Abdomen

## 2021-01-25 MED ORDER — CHLORHEXIDINE GLUCONATE 0.12 % MT SOLN
15.0000 mL | Freq: Once | OROMUCOSAL | Status: AC
  Administered 2021-01-25: 15 mL via OROMUCOSAL

## 2021-01-25 MED ORDER — ONDANSETRON HCL 4 MG/2ML IJ SOLN: 4.0000 mg | Freq: Once | INTRAMUSCULAR | Status: DC | PRN

## 2021-01-25 MED ORDER — SPIRONOLACTONE 12.5 MG HALF TABLET
12.5000 mg | ORAL_TABLET | ORAL | Status: DC
  Administered 2021-01-25: 12.5 mg via ORAL
  Filled 2021-01-25: qty 1

## 2021-01-25 MED ORDER — LACTATED RINGERS IR SOLN
Status: DC | PRN
  Administered 2021-01-25: 1000 mL

## 2021-01-25 MED ORDER — LOSARTAN POTASSIUM 50 MG PO TABS
100.0000 mg | ORAL_TABLET | Freq: Every morning | ORAL | Status: DC
Start: 2021-01-26 — End: 2021-01-26
  Administered 2021-01-26: 100 mg via ORAL
  Filled 2021-01-25: qty 2

## 2021-01-25 MED ORDER — EPHEDRINE SULFATE 50 MG/ML IJ SOLN
INTRAMUSCULAR | Status: DC | PRN
  Administered 2021-01-25 (×4): 5 mg via INTRAVENOUS

## 2021-01-25 MED ORDER — SODIUM CHLORIDE 0.9 % IV SOLN
2.0000 g | INTRAVENOUS | Status: AC
  Administered 2021-01-25: 2 g via INTRAVENOUS
  Filled 2021-01-25: qty 20

## 2021-01-25 MED ORDER — DEXAMETHASONE SODIUM PHOSPHATE 10 MG/ML IJ SOLN
INTRAMUSCULAR | Status: AC
  Filled 2021-01-25: qty 1

## 2021-01-25 MED ORDER — MIRABEGRON ER 25 MG PO TB24
25.0000 mg | ORAL_TABLET | Freq: Every day | ORAL | Status: DC
  Administered 2021-01-25 – 2021-01-26 (×2): 25 mg via ORAL
  Filled 2021-01-25 (×2): qty 1

## 2021-01-25 MED ORDER — MAGIC MOUTHWASH
15.0000 mL | Freq: Four times a day (QID) | ORAL | Status: DC | PRN
  Filled 2021-01-25: qty 15

## 2021-01-25 MED ORDER — DIPHENHYDRAMINE HCL 12.5 MG/5ML PO ELIX: 12.5000 mg | ORAL_SOLUTION | Freq: Four times a day (QID) | ORAL | Status: DC | PRN

## 2021-01-25 MED ORDER — FENTANYL CITRATE (PF) 100 MCG/2ML IJ SOLN
INTRAMUSCULAR | Status: DC | PRN
  Administered 2021-01-25 (×4): 25 ug via INTRAVENOUS

## 2021-01-25 MED ORDER — ISOSORBIDE MONONITRATE ER 60 MG PO TB24
90.0000 mg | ORAL_TABLET | Freq: Every evening | ORAL | Status: DC
  Administered 2021-01-25: 90 mg via ORAL
  Filled 2021-01-25: qty 1

## 2021-01-25 MED ORDER — DICLOFENAC SODIUM 1 % EX GEL
1.0000 "application " | Freq: Four times a day (QID) | CUTANEOUS | Status: DC | PRN
  Filled 2021-01-25: qty 100

## 2021-01-25 MED ORDER — DIPHENHYDRAMINE HCL 50 MG/ML IJ SOLN: 12.5000 mg | Freq: Four times a day (QID) | INTRAMUSCULAR | Status: DC | PRN

## 2021-01-25 MED ORDER — KETOROLAC TROMETHAMINE 10 MG PO TABS: 10.0000 mg | ORAL_TABLET | Freq: Four times a day (QID) | ORAL | Status: DC | PRN

## 2021-01-25 MED ORDER — ONDANSETRON HCL 4 MG/2ML IJ SOLN
INTRAMUSCULAR | Status: AC
  Filled 2021-01-25: qty 2

## 2021-01-25 MED ORDER — ZINC SULFATE 220 (50 ZN) MG PO CAPS
220.0000 mg | ORAL_CAPSULE | Freq: Every day | ORAL | Status: DC
  Administered 2021-01-25 – 2021-01-26 (×2): 220 mg via ORAL
  Filled 2021-01-25 (×2): qty 1

## 2021-01-25 MED ORDER — LIDOCAINE 2% (20 MG/ML) 5 ML SYRINGE
INTRAMUSCULAR | Status: AC
  Filled 2021-01-25: qty 5

## 2021-01-25 MED ORDER — GABAPENTIN 300 MG PO CAPS
300.0000 mg | ORAL_CAPSULE | Freq: Two times a day (BID) | ORAL | Status: DC
  Administered 2021-01-25 – 2021-01-26 (×2): 300 mg via ORAL
  Filled 2021-01-25 (×2): qty 1

## 2021-01-25 MED ORDER — FENTANYL CITRATE (PF) 100 MCG/2ML IJ SOLN: 25.0000 ug | INTRAMUSCULAR | Status: DC | PRN

## 2021-01-25 MED ORDER — BUPIVACAINE LIPOSOME 1.3 % IJ SUSP
INTRAMUSCULAR | Status: DC | PRN
  Administered 2021-01-25: 20 mL

## 2021-01-25 MED ORDER — OXYCODONE HCL 5 MG/5ML PO SOLN
5.0000 mg | Freq: Once | ORAL | Status: DC | PRN
Start: 2021-01-25 — End: 2021-01-25

## 2021-01-25 MED ORDER — IOHEXOL 300 MG/ML  SOLN
INTRAMUSCULAR | Status: DC | PRN
  Administered 2021-01-25: 8 mL

## 2021-01-25 MED ORDER — METRONIDAZOLE 500 MG/100ML IV SOLN
500.0000 mg | INTRAVENOUS | Status: AC
  Administered 2021-01-25: 500 mg via INTRAVENOUS
  Filled 2021-01-25: qty 100

## 2021-01-25 MED ORDER — PANTOPRAZOLE SODIUM 40 MG PO TBEC
40.0000 mg | DELAYED_RELEASE_TABLET | Freq: Every day | ORAL | Status: DC
Start: 2021-01-25 — End: 2021-01-26
  Administered 2021-01-25 – 2021-01-26 (×2): 40 mg via ORAL
  Filled 2021-01-25 (×2): qty 1

## 2021-01-25 MED ORDER — GLYCOPYRROLATE 0.2 MG/ML IJ SOLN
INTRAMUSCULAR | Status: DC | PRN
  Administered 2021-01-25: .2 mg via INTRAVENOUS

## 2021-01-25 MED ORDER — SODIUM CHLORIDE 0.9% FLUSH: 3.0000 mL | Freq: Two times a day (BID) | INTRAVENOUS | Status: DC

## 2021-01-25 MED ORDER — METOPROLOL SUCCINATE ER 50 MG PO TB24
75.0000 mg | ORAL_TABLET | Freq: Every day | ORAL | Status: DC
  Administered 2021-01-25: 75 mg via ORAL
  Filled 2021-01-25: qty 1

## 2021-01-25 MED ORDER — PRAMIPEXOLE DIHYDROCHLORIDE 0.25 MG PO TABS
0.2500 mg | ORAL_TABLET | Freq: Two times a day (BID) | ORAL | Status: DC
  Administered 2021-01-25 – 2021-01-26 (×2): 0.25 mg via ORAL
  Filled 2021-01-25 (×2): qty 1

## 2021-01-25 MED ORDER — ORAL CARE MOUTH RINSE: 15.0000 mL | Freq: Once | OROMUCOSAL | Status: AC

## 2021-01-25 MED ORDER — AMISULPRIDE (ANTIEMETIC) 5 MG/2ML IV SOLN: 10.0000 mg | Freq: Once | INTRAVENOUS | Status: DC | PRN

## 2021-01-25 MED ORDER — NITROGLYCERIN 0.4 MG SL SUBL: 0.4000 mg | SUBLINGUAL_TABLET | SUBLINGUAL | Status: DC | PRN

## 2021-01-25 MED ORDER — LIDOCAINE HCL (CARDIAC) PF 100 MG/5ML IV SOSY
PREFILLED_SYRINGE | INTRAVENOUS | Status: DC | PRN
  Administered 2021-01-25: 100 mg via INTRAVENOUS

## 2021-01-25 MED ORDER — LACTATED RINGERS IV BOLUS: 1000.0000 mL | Freq: Three times a day (TID) | INTRAVENOUS | Status: DC | PRN

## 2021-01-25 MED ORDER — SODIUM CHLORIDE 0.9% FLUSH: 3.0000 mL | INTRAVENOUS | Status: DC | PRN

## 2021-01-25 MED ORDER — FAMOTIDINE 20 MG PO TABS: 20.0000 mg | ORAL_TABLET | Freq: Every evening | ORAL | Status: DC | PRN

## 2021-01-25 MED ORDER — ONDANSETRON 4 MG PO TBDP: 4.0000 mg | ORAL_TABLET | Freq: Four times a day (QID) | ORAL | Status: DC | PRN

## 2021-01-25 MED ORDER — ACETAMINOPHEN 500 MG PO TABS: 1000.0000 mg | ORAL_TABLET | ORAL | Status: DC

## 2021-01-25 MED ORDER — POTASSIUM CHLORIDE CRYS ER 10 MEQ PO TBCR
10.0000 meq | EXTENDED_RELEASE_TABLET | Freq: Every evening | ORAL | Status: DC
  Administered 2021-01-25: 10 meq via ORAL
  Filled 2021-01-25: qty 1

## 2021-01-25 MED ORDER — METHOCARBAMOL 500 MG PO TABS: 500.0000 mg | ORAL_TABLET | Freq: Four times a day (QID) | ORAL | Status: DC | PRN

## 2021-01-25 MED ORDER — SODIUM CHLORIDE 0.9 % IV SOLN
2.0000 g | Freq: Three times a day (TID) | INTRAVENOUS | Status: AC
  Administered 2021-01-25: 2 g via INTRAVENOUS
  Filled 2021-01-25 (×2): qty 2

## 2021-01-25 MED ORDER — AMLODIPINE BESYLATE 10 MG PO TABS
10.0000 mg | ORAL_TABLET | Freq: Every morning | ORAL | Status: DC
  Administered 2021-01-26: 10 mg via ORAL
  Filled 2021-01-25: qty 1

## 2021-01-25 MED ORDER — CHLORHEXIDINE GLUCONATE CLOTH 2 % EX PADS: 6.0000 | MEDICATED_PAD | Freq: Once | CUTANEOUS | Status: DC

## 2021-01-25 MED ORDER — VITAMIN D 25 MCG (1000 UNIT) PO TABS
2000.0000 [IU] | ORAL_TABLET | Freq: Every day | ORAL | Status: DC
  Administered 2021-01-25 – 2021-01-26 (×2): 2000 [IU] via ORAL
  Filled 2021-01-25 (×3): qty 2

## 2021-01-25 MED ORDER — LACTATED RINGERS IV SOLN: INTRAVENOUS | Status: DC

## 2021-01-25 MED ORDER — PROPOFOL 10 MG/ML IV BOLUS
INTRAVENOUS | Status: DC | PRN
  Administered 2021-01-25: 120 mg via INTRAVENOUS

## 2021-01-25 MED ORDER — ONDANSETRON HCL 4 MG/2ML IJ SOLN
INTRAMUSCULAR | Status: DC | PRN
  Administered 2021-01-25: 4 mg via INTRAVENOUS

## 2021-01-25 MED ORDER — DEXAMETHASONE SODIUM PHOSPHATE 10 MG/ML IJ SOLN
INTRAMUSCULAR | Status: DC | PRN
Start: 2021-01-25 — End: 2021-01-25
  Administered 2021-01-25: 5 mg via INTRAVENOUS

## 2021-01-25 MED ORDER — BUPIVACAINE-EPINEPHRINE (PF) 0.25% -1:200000 IJ SOLN
INTRAMUSCULAR | Status: AC
  Filled 2021-01-25: qty 30

## 2021-01-25 MED ORDER — HYDROMORPHONE HCL 1 MG/ML IJ SOLN
INTRAMUSCULAR | Status: DC | PRN
  Administered 2021-01-25: .4 mg via INTRAVENOUS

## 2021-01-25 MED ORDER — TRAMADOL HCL 50 MG PO TABS
50.0000 mg | ORAL_TABLET | Freq: Two times a day (BID) | ORAL | Status: DC | PRN
  Administered 2021-01-26: 50 mg via ORAL

## 2021-01-25 MED ORDER — HYDROMORPHONE HCL 2 MG/ML IJ SOLN
INTRAMUSCULAR | Status: AC
  Filled 2021-01-25: qty 1

## 2021-01-25 MED ORDER — BUPIVACAINE-EPINEPHRINE 0.25% -1:200000 IJ SOLN
INTRAMUSCULAR | Status: DC | PRN
  Administered 2021-01-25: 60 mL

## 2021-01-25 MED ORDER — FENTANYL CITRATE (PF) 100 MCG/2ML IJ SOLN
INTRAMUSCULAR | Status: AC
  Filled 2021-01-25: qty 2

## 2021-01-25 MED ORDER — ENALAPRILAT 1.25 MG/ML IV SOLN
0.6250 mg | Freq: Four times a day (QID) | INTRAVENOUS | Status: DC | PRN
  Filled 2021-01-25: qty 1

## 2021-01-25 MED ORDER — SODIUM CHLORIDE 0.9 % IV SOLN: 250.0000 mL | INTRAVENOUS | Status: DC | PRN

## 2021-01-25 MED ORDER — SUGAMMADEX SODIUM 200 MG/2ML IV SOLN
INTRAVENOUS | Status: DC | PRN
  Administered 2021-01-25: 160 mg via INTRAVENOUS

## 2021-01-25 MED ORDER — ACETAMINOPHEN 500 MG PO TABS
1000.0000 mg | ORAL_TABLET | Freq: Four times a day (QID) | ORAL | Status: DC
  Administered 2021-01-25 – 2021-01-26 (×3): 1000 mg via ORAL
  Filled 2021-01-25 (×3): qty 2

## 2021-01-25 MED ORDER — MAGNESIUM HYDROXIDE 400 MG/5ML PO SUSP: 30.0000 mL | Freq: Every day | ORAL | Status: DC | PRN

## 2021-01-25 MED ORDER — OXYCODONE HCL 5 MG PO TABS: 5.0000 mg | ORAL_TABLET | Freq: Once | ORAL | Status: DC | PRN

## 2021-01-25 MED ORDER — MAGNESIUM OXIDE -MG SUPPLEMENT 400 (240 MG) MG PO TABS
400.0000 mg | ORAL_TABLET | Freq: Two times a day (BID) | ORAL | Status: DC
  Administered 2021-01-25 – 2021-01-26 (×2): 400 mg via ORAL
  Filled 2021-01-25 (×2): qty 1

## 2021-01-25 MED ORDER — SIMVASTATIN 20 MG PO TABS
20.0000 mg | ORAL_TABLET | Freq: Every evening | ORAL | Status: DC
  Administered 2021-01-25: 20 mg via ORAL
  Filled 2021-01-25: qty 1

## 2021-01-25 MED ORDER — 0.9 % SODIUM CHLORIDE (POUR BTL) OPTIME
TOPICAL | Status: DC | PRN
  Administered 2021-01-25: 1000 mL

## 2021-01-25 MED ORDER — METOPROLOL TARTRATE 5 MG/5ML IV SOLN
5.0000 mg | Freq: Four times a day (QID) | INTRAVENOUS | Status: DC | PRN
Start: 2021-01-25 — End: 2021-01-26
  Filled 2021-01-25: qty 5

## 2021-01-25 MED ORDER — TRAMADOL HCL 50 MG PO TABS
50.0000 mg | ORAL_TABLET | Freq: Four times a day (QID) | ORAL | Status: DC | PRN
  Administered 2021-01-25: 50 mg via ORAL
  Filled 2021-01-25 (×2): qty 1

## 2021-01-25 MED ORDER — SIMETHICONE 80 MG PO CHEW: 40.0000 mg | CHEWABLE_TABLET | Freq: Four times a day (QID) | ORAL | Status: DC | PRN

## 2021-01-25 MED ORDER — ASPIRIN EC 81 MG PO TBEC
81.0000 mg | DELAYED_RELEASE_TABLET | Freq: Every morning | ORAL | Status: DC
  Administered 2021-01-26: 81 mg via ORAL
  Filled 2021-01-25: qty 1

## 2021-01-25 MED ORDER — ENSURE PRE-SURGERY PO LIQD
296.0000 mL | Freq: Once | ORAL | Status: DC
  Filled 2021-01-25: qty 296

## 2021-01-25 MED ORDER — ROCURONIUM BROMIDE 100 MG/10ML IV SOLN
INTRAVENOUS | Status: DC | PRN
  Administered 2021-01-25: 60 mg via INTRAVENOUS

## 2021-01-25 MED ORDER — ENOXAPARIN SODIUM 40 MG/0.4ML IJ SOSY
40.0000 mg | PREFILLED_SYRINGE | INTRAMUSCULAR | Status: DC
  Administered 2021-01-26: 40 mg via SUBCUTANEOUS
  Filled 2021-01-25: qty 0.4

## 2021-01-25 MED ORDER — LIP MEDEX EX OINT
1.0000 "application " | TOPICAL_OINTMENT | Freq: Two times a day (BID) | CUTANEOUS | Status: DC
  Administered 2021-01-25 – 2021-01-26 (×2): 1 via TOPICAL
  Filled 2021-01-25 (×2): qty 7

## 2021-01-25 MED ORDER — GABAPENTIN 300 MG PO CAPS
300.0000 mg | ORAL_CAPSULE | ORAL | Status: AC
Start: 2021-01-25 — End: 2021-01-25
  Administered 2021-01-25: 300 mg via ORAL
  Filled 2021-01-25: qty 1

## 2021-01-25 MED ORDER — ROCURONIUM BROMIDE 10 MG/ML (PF) SYRINGE
PREFILLED_SYRINGE | INTRAVENOUS | Status: AC
  Filled 2021-01-25: qty 10

## 2021-01-25 MED ORDER — ASCORBIC ACID 500 MG PO TABS
500.0000 mg | ORAL_TABLET | Freq: Every day | ORAL | Status: DC
  Administered 2021-01-26: 500 mg via ORAL
  Filled 2021-01-25: qty 1

## 2021-01-25 MED ORDER — BISACODYL 10 MG RE SUPP: 10.0000 mg | Freq: Every day | RECTAL | Status: DC | PRN

## 2021-01-25 MED ORDER — KETOROLAC TROMETHAMINE 10 MG PO TABS
10.0000 mg | ORAL_TABLET | Freq: Four times a day (QID) | ORAL | Status: DC | PRN
  Filled 2021-01-25: qty 1

## 2021-01-25 SURGICAL SUPPLY — 45 items
APPLIER CLIP 5 13 M/L LIGAMAX5 (MISCELLANEOUS)
APPLIER CLIP ROT 10 11.4 M/L (STAPLE)
APR CLP MED LRG 11.4X10 (STAPLE)
APR CLP MED LRG 5 ANG JAW (MISCELLANEOUS)
BAG COUNTER SPONGE SURGICOUNT (BAG) ×3 IMPLANT
BAG SPEC RTRVL 10 TROC 200 (ENDOMECHANICALS) ×2
BAG SPNG CNTER NS LX DISP (BAG) ×2
CABLE HIGH FREQUENCY MONO STRZ (ELECTRODE) IMPLANT
CLIP APPLIE 5 13 M/L LIGAMAX5 (MISCELLANEOUS) IMPLANT
CLIP APPLIE ROT 10 11.4 M/L (STAPLE) IMPLANT
COVER SURGICAL LIGHT HANDLE (MISCELLANEOUS) ×3 IMPLANT
DECANTER SPIKE VIAL GLASS SM (MISCELLANEOUS) ×3 IMPLANT
DRAPE 3/4 80X56 (DRAPES) ×3 IMPLANT
DRAPE C-ARM 42X120 X-RAY (DRAPES) ×3 IMPLANT
DRAPE UTILITY XL STRL (DRAPES) ×3 IMPLANT
DRAPE WARM FLUID 44X44 (DRAPES) ×3 IMPLANT
DRSG TEGADERM 2-3/8X2-3/4 SM (GAUZE/BANDAGES/DRESSINGS) ×6 IMPLANT
DRSG TEGADERM 6X8 (GAUZE/BANDAGES/DRESSINGS) ×3 IMPLANT
ELECT PENCIL ROCKER SW 15FT (MISCELLANEOUS) ×2 IMPLANT
ELECT REM PT RETURN 15FT ADLT (MISCELLANEOUS) ×3 IMPLANT
ENDOLOOP SUT PDS II  0 18 (SUTURE)
ENDOLOOP SUT PDS II 0 18 (SUTURE) IMPLANT
GLOVE SURG LTX SZ8 (GLOVE) ×3 IMPLANT
GLOVE SURG UNDER LTX SZ8 (GLOVE) ×3 IMPLANT
GOWN STRL REUS W/TWL XL LVL3 (GOWN DISPOSABLE) ×6 IMPLANT
IRRIG SUCT STRYKERFLOW 2 WTIP (MISCELLANEOUS) ×3
IRRIGATION SUCT STRKRFLW 2 WTP (MISCELLANEOUS) ×2 IMPLANT
KIT BASIN OR (CUSTOM PROCEDURE TRAY) ×3 IMPLANT
KIT TURNOVER KIT A (KITS) ×3 IMPLANT
PENCIL SMOKE EVACUATOR (MISCELLANEOUS) IMPLANT
POUCH RETRIEVAL ECOSAC 10 (ENDOMECHANICALS) ×2 IMPLANT
POUCH RETRIEVAL ECOSAC 10MM (ENDOMECHANICALS) ×3
SCISSORS LAP 5X35 DISP (ENDOMECHANICALS) ×3 IMPLANT
SET CHOLANGIOGRAPH MIX (MISCELLANEOUS) ×3 IMPLANT
SET TUBE SMOKE EVAC HIGH FLOW (TUBING) ×3 IMPLANT
SLEEVE XCEL OPT CAN 5 100 (ENDOMECHANICALS) IMPLANT
SUT MNCRL AB 4-0 PS2 18 (SUTURE) ×5 IMPLANT
SUT PDS AB 1 CT  36 (SUTURE) ×6
SUT PDS AB 1 CT 36 (SUTURE) ×2 IMPLANT
SYR 20ML LL LF (SYRINGE) ×3 IMPLANT
TOWEL OR 17X26 10 PK STRL BLUE (TOWEL DISPOSABLE) ×3 IMPLANT
TOWEL OR NON WOVEN STRL DISP B (DISPOSABLE) ×3 IMPLANT
TRAY LAPAROSCOPIC (CUSTOM PROCEDURE TRAY) ×3 IMPLANT
TROCAR BLADELESS OPT 5 100 (ENDOMECHANICALS) ×3 IMPLANT
TROCAR XCEL NON-BLD 11X100MML (ENDOMECHANICALS) ×3 IMPLANT

## 2021-01-25 NOTE — Discharge Instructions (Addendum)
[facility] REPAIR: POST OP INSTRUCTIONS  ######################################################################  EAT Gradually transition to a high fiber diet with a fiber supplement over the next few weeks after discharge.  Start with a pureed / full liquid diet (see below)  WALK Walk an hour a day.  Control your pain to do that.    CONTROL PAIN Control pain so that you can walk, sleep, tolerate sneezing/coughing, and go up/down stairs.  HAVE A BOWEL MOVEMENT DAILY Keep your bowels regular to avoid problems.  OK to try a laxative to override constipation.  OK to use an antidairrheal to slow down diarrhea.  Call if not better after 2 tries  CALL IF YOU HAVE PROBLEMS/CONCERNS Call if you are still struggling despite following these instructions. Call if you have concerns not answered by these instructions  ######################################################################    DIET: Follow a light bland diet & liquids the first 24 hours after arrival home, such as soup, liquids, starches, etc.  Be sure to drink plenty of fluids.  Quickly advance to a usual solid diet within a few days.  Avoid fast food or heavy meals as your are more likely to get nauseated or have irregular bowels.  A low-fat, high-fiber diet for the rest of your life is ideal.   Take your usually prescribed home medications unless otherwise directed.  PAIN CONTROL: Pain is best controlled by a usual combination of three different methods TOGETHER: Ice/Heat Over the counter pain medication Prescription pain medication Most patients will experience some swelling and bruising around the hernia(s) such as the bellybutton, groins, or old incisions.  Ice packs or heating pads (30-60 minutes up to 6 times a day) will help. Use ice for the first few days to help decrease swelling and bruising, then switch to heat to help relax tight/sore spots and speed recovery.  Some people prefer to use ice alone, heat alone, alternating  between ice & heat.  Experiment to what works for you.  Swelling and bruising can take several weeks to resolve.   It is helpful to take an over-the-counter pain medication regularly for the first few weeks.  Choose one of the following that works best for you: Naproxen (Aleve, etc)  Two 244m tabs twice a day Ibuprofen (Advil, etc) Three 2045mtabs four times a day (every meal & bedtime) Acetaminophen (Tylenol, etc) 325-65043mour times a day (every meal & bedtime) A  prescription for pain medication should be given to you upon discharge.  Take your pain medication as prescribed.  If you are having problems/concerns with the prescription medicine (does not control pain, nausea, vomiting, rash, itching, etc), please call us [phone] see if we need to switch you to a different pain medicine that will work better for you and/or control your side effect better. If you need a refill on your pain medication, please contact your pharmacy.  They will contact our office to request authorization. Prescriptions will not be filled after 5 pm or on week-ends.  Avoid getting constipated.  Between the surgery and the pain medications, it is common to experience some constipation.  Increasing fluid intake and taking a fiber supplement (such as Metamucil, Citrucel, FiberCon, MiraLax, etc) 1-2 times a day regularly will usually help prevent this problem from occurring.  A mild laxative (prune juice, Milk of Magnesia, MiraLax, etc) should be taken according to package directions if there are no bowel movements after 48 hours.    Wash / shower every day.  You may shower over the dressings  as they are waterproof.    Remove your waterproof bandages, skin tapes, and other bandages 7/25, Monday after surgery. You may replace a dressing/Band-Aid to cover the incision for comfort if you wish. You may leave the incisions open to air.  You may replace a dressing/Band-Aid to cover an incision for comfort if you wish.   Continue to shower over incision(s) after the dressing is off.  ACTIVITIES as tolerated:   You may resume regular (light) daily activities beginning the next day--such as daily self-care, walking, climbing stairs--gradually increasing activities as tolerated.  Control your pain so that you can walk an hour a day.  If you can walk 30 minutes without difficulty, it is safe to try more intense activity such as jogging, treadmill, bicycling, low-impact aerobics, swimming, etc. Save the most intensive and strenuous activity for last such as sit-ups, heavy lifting, contact sports, etc  Refrain from any heavy lifting or straining until you are off narcotics for pain control.   DO NOT PUSH THROUGH PAIN.  Let pain be your guide: If it hurts to do something, don't do it.  Pain is your body warning you to avoid that activity for another week until the pain goes down. You may drive when you are no longer taking prescription pain medication, you can comfortably wear a seatbelt, and you can safely maneuver your car and apply brakes. You may have sexual intercourse when it is comfortable.   FOLLOW UP in our office Please call [facility] at [phone] to set up an appointment to see your surgeon in the office for a follow-up appointment approximately 2-3 weeks after your surgery. Make sure that you call for this appointment the day you arrive home to insure a convenient appointment time.  9.  If you have disability of FMLA / Family leave forms, please bring the forms to the office for processing.  (do not give to your surgeon).  WHEN TO CALL us [phone]: Poor pain control Reactions / problems with new medications (rash/itching, nausea, etc)  Fever over 101.5 F (38.5 C) Inability to urinate Nausea and/or vomiting Worsening swelling or bruising Continued bleeding from incision. Increased pain, redness, or drainage from the incision   The clinic staff is available to answer your questions during regular  business hours (8:30am-5pm).  Please don't hesitate to call and ask to speak to one of our nurses for clinical concerns.   If you have a medical emergency, go to the nearest emergency room or call 911.  A surgeon from [facility] is always on call at the hospitals in [facility], [place], [place], [place], [place]  [address] ?  [address] [address], [place], [place]   [address] MAIN: [phone] ? TOLL FREE: [phone] ? FAX: [phone] [email or web address]

## 2021-01-25 NOTE — Anesthesia Procedure Notes (Signed)
Procedure Name: Intubation Date/Time: 01/25/2021 2:27 PM Performed by: Jari Pigg, CRNA Pre-anesthesia Checklist: Patient identified, Emergency Drugs available, Suction available and Patient being monitored Patient Re-evaluated:Patient Re-evaluated prior to induction Oxygen Delivery Method: Circle system utilized Preoxygenation: Pre-oxygenation with 100% oxygen Induction Type: IV induction Ventilation: Mask ventilation without difficulty Laryngoscope Size: Mac and 3 Grade View: Grade I Tube type: Oral Tube size: 7.0 mm Number of attempts: 1 Airway Equipment and Method: Stylet and Oral airway Placement Confirmation: ETT inserted through vocal cords under direct vision, positive ETCO2 and breath sounds checked- equal and bilateral Secured at: 22 cm Tube secured with: Tape Dental Injury: Teeth and Oropharynx as per pre-operative assessment

## 2021-01-25 NOTE — Op Note (Signed)
01/25/2021  PATIENT:  Heather [name]  86 y.o. female  Patient Care Team: Mosie Lukes, MD as PCP - General (Family Medicine) [name], MD as PCP - Cardiology (Cardiology) Idelle Leech, [place] (Optometry) Janyth Contes, MD as Consulting Physician (Obstetrics and Gynecology) Trula Slade, DPM as Consulting Physician (Podiatry) Sharmon Revere as Physician Assistant (Cardiology) [name], PharmD (Pharmacist) Michael Boston, MD as Consulting Physician (General Surgery) Jaquita Folds, MD as Consulting Physician (Gynecology) Armbruster, Carlota Raspberry, MD as Consulting Physician (Gastroenterology)  PRE-OPERATIVE DIAGNOSIS:    Chronic Calculus cholecystitis Incarcerated incisional periumbilical hernia  POST-OPERATIVE DIAGNOSIS:   Chronic Calculus cholecystitis Incarcerated incisional hernias  PROCEDURE:  Laparoscopic cholecystectomy with intraoperative cholangiogram (CPT code [phone]) Reduction and primary repair of incarcerated incisional hernias  SURGEON:  [name], MD, FACS.  ASSISTANT: Carlena Hurl, PA-C An experienced assistant was required given the standard of surgical care given the complexity of the case.  This assistant was needed for exposure, dissection, suction, tissue approximation, retraction, perception, etc.  ANESTHESIA:    General with endotracheal intubation Local anesthetic as a field block  EBL:  (See Anesthesia Intraoperative Record) Total I/O In: 1600 [I.V.:1400; IV Piggyback:200] Out: 50 [Blood:50]   Delay start of Pharmacological VTE agent (>24hrs) due to surgical blood loss or risk of bleeding:  no  DRAINS: None   SPECIMEN: Gallbladder    DISPOSITION OF SPECIMEN:  PATHOLOGY  COUNTS:  YES  PLAN OF CARE: Admit for overnight observation  PATIENT DISPOSITION:  PACU - hemodynamically stable.  INDICATION: Pleasant elderly woman with intermittent postprandial abdominal pain.  Has history of hiatal hernia and  other issues.  Symptoms became more persistent and suspicious for biliary etiology.  Recurrent hiatal hernia with GERD well controlled but no major dysphagia.  Rest of the differential diagnosis seems unlikely.  Patient also has a painful enlarging mass near her bellybutton concerning for a periumbilical incisional hernia.  Request repair as well.  The anatomy & physiology of hepatobiliary & pancreatic function was discussed.  The pathophysiology of gallbladder dysfunction was discussed.  Natural history risks without surgery was discussed.   I feel the risks of no intervention will lead to serious problems that outweigh the operative risks; therefore, I recommended cholecystectomy to remove the pathology.  I explained laparoscopic techniques with possible need for an open approach.  Probable cholangiogram to evaluate the bilary tract was explained as well.    The anatomy & physiology of the abdominal wall was discussed.  The pathophysiology of hernias was discussed.  Natural history risks without surgery including progeressive enlargement, pain, incarceration, & strangulation was discussed.   Contributors to complications such as smoking, obesity, diabetes, prior surgery, etc were discussed.   I feel the risks of no intervention will lead to serious problems that outweigh the operative risks; therefore, I recommended surgery to reduce and repair the hernia.  I explained laparoscopic techniques with possible need for an open approach.  I noted the probable use of mesh to patch and/or buttress the hernia repair  Risks such as bleeding, infection, abscess, need for further treatment, injury to other organs, need for repair of tissues / organs, stroke, heart attack, death, and other risks were discussed.  I noted a good likelihood this will help address the problem.   Goals of post-operative recovery were discussed as well.  Possibility that this will not correct all symptoms was explained.  I stressed the  importance of low-impact activity, aggressive pain control, avoiding constipation, &  not pushing through pain to minimize risk of post-operative chronic pain or injury. Possibility of reherniation especially with smoking, obesity, diabetes, immunosuppression, and other health conditions was discussed.  We will work to minimize complications.     An educational handout further explaining the pathology & treatment options was given as well.  Questions were answered.  The patient expresses understanding & wishes to proceed with surgery.    OR FINDINGS: Dilated boggy gallbladder with thickening consistent with chronic cholecystitis.  Very narrow cystic duct with stricturing suspicious for chronic partial cystic duct outlet obstruction.  Cholangiogram with dilated intra and extrahepatic ducts most likely consistent with advanced age.  No evidence of choledocholithiasis nor leak.  Liver: normal no needle biopsy done  Supraumbilical 7 x 3 cm region of incisional hernia incarcerated with omentum.  Largest periumbilical.  Reduction debridement and primary repair done.  No mesh repair given the fact cholecystectomy was done at the same time.  DESCRIPTION:   Informed consent was confirmed.  The patient underwent general anaesthesia without difficulty.  The patient was positioned appropriately.  VTE prevention in place.  The patient's abdomen was clipped, prepped, & draped in a sterile fashion.  Surgical timeout confirmed our plan.  Peritoneal entry with a laparoscopic port was obtained using a modified cutdown Hassan technique through the periumbilical hernia that was able be reduced.  Used optical entry to safely enter into the abdominal cavity.  Encountered moderate omentum but saw no injury.  I induced carbon dioxide insufflation.  Camera inspection revealed no injury.  Extra ports were carefully placed under direct laparoscopic visualization.  I worked to reduce the periumbilical hernia that had been  uncomfortable and incarcerated.  Contained omentum.  Swiss cheese region of hernias noted with exposed old suture.  I turned attention to the right upper quadrant.  The gallbladder fundus was elevated cephalad.   The gallbladder had some thickening of visceral peritoneum implying some chronic irritation.  Boggy and dilated and crying and classic burnout chronic cholecystitis..  I used hook cautery to free the peritoneal coverings between the gallbladder and the liver on the posteriolateral and anteriomedial walls.   I used careful blunt and hook dissection to help get a good critical view of the cystic artery and cystic duct. I did further dissection to free 50%of the gallbladder off the liver bed to get a good critical view of the infundibulum and cystic duct.  I dissected out the cystic artery; and, after getting a good 360 view, ligated the anterior & posterior branches of the cystic artery close on the infundibulum using the Harmonic ultrasonic dissection.  I skeletonized the cystic duct.  I placed a clip on the infundibulum. I did a partial cystic duct-otomy and ensured patency. I placed a 5 [place] cholangiocatheter through a puncture site at the right subcostal ridge of the abdominal wall and directed it into the cystic duct.  We ran a cholangiogram with dilute radio-opaque contrast and continuous fluoroscopy. Contrast flowed from a side branch consistent with cystic duct cannulization. Contrast flowed up the common hepatic duct into the right and left intrahepatic chains out to secondary radicals.  Contrast did not flow very well more distally.  I went back and laparoscopically and reposition the catheter so he could pass through a strictured area in the cystic duct and into the more proximal cystic duct.  We ran cholangiogram and confirmed that we had cystic duct cannulization with good filling of intra and extrahepatic ducts.  Chronically dilated boggy.  However  contrast did go down the common bile  duct easily across the normal ampulla into the duodenum.  This was consistent with a normal cholangiogram.  I removed the cholangiocatheter. I placed clips on the cystic duct x4.  I completed cystic duct transection. I freed the gallbladder from its remaining attachments to the liver. I ensured hemostasis on the gallbladder fossa of the liver and elsewhere. I inspected the rest of the abdomen & detected no injury nor bleeding elsewhere.  I removed the gallbladder outside the largest supraumbilical hernia.  We ended up opening the midline incision to free open the Swiss cheese hernia and debride back to much healthier fascia.  Removed all sutures.  I primarily closed the region of Swiss cheese hernias with #1 PDS in a running fashion.  Removed excess skin hernia sac and skin especially on the umbilicus.  I closed the skin using 4-0 monocryl stitch.  Sterile dressings were applied. The patient was extubated & arrived in the PACU in stable condition..  I had discussed postoperative care with the patient in the holding area.  We will plan to watch her overnight given her advanced age and needing to procedures done.  That was his preference by patient and daughter as well.  Anesthesia agreed.  I discussed operative findings, updated the patient's status, discussed probable steps to recovery, and gave postoperative recommendations to the  patient's daughter, Anthoney Harada .  Recommendations were made.  Questions were answered.  She expressed understanding & appreciation.  [name], M.D., F.A.C.S. Gastrointestinal and Minimally Invasive Surgery [facility] [address] [place], [place] [address] [phone] Main / Paging  01/25/2021 4:35 PM

## 2021-01-25 NOTE — Transfer of Care (Signed)
Immediate Anesthesia Transfer of Care Note  Patient: Heather [name]  Procedure(s) Performed: LAPAROSCOPIC CHOLECYSTECTOMY WITH INTRAOPERATIVE CHOLANGIOGRAM AND LYSIS OF ADHESIONS (Abdomen) PRIMARY REPAIR OF INCISIONAL HERNIA (Abdomen)  Patient Location: PACU  Anesthesia Type:General  Level of Consciousness: drowsy  Airway & Oxygen Therapy: Patient Spontanous Breathing and Patient connected to face mask oxygen  Post-op Assessment: Report given to RN and Post -op Vital signs reviewed and stable  Post vital signs: Reviewed and stable  Last Vitals:  Vitals Value Taken Time  BP 149/62 01/25/21 1628  Temp    Pulse 83 01/25/21 1629  Resp 14 01/25/21 1629  SpO2 99 % 01/25/21 1629  Vitals shown include unvalidated device data.  Last Pain:  Vitals:   01/25/21 1300  TempSrc:   PainSc: 0-No pain      Patients Stated Pain Goal: 3 (72/89/79 1504)  Complications: No notable events documented.

## 2021-01-25 NOTE — Interval H&P Note (Signed)
History and Physical Interval Note:  01/25/2021 1:31 PM  Heather [name]  has presented today for surgery, with the diagnosis of SYMPTOMATIC BILIARY COLIC, PROBABLE CHRONIC CHOLECYSTITIS. INCISIONAL HERNIA.  The various methods of treatment have been discussed with the patient and family. After consideration of risks, benefits and other options for treatment, the patient has consented to  Procedure(s): LAPAROSCOPIC CHOLECYSTECTOMY WITH INTRAOPERATIVE CHOLANGIOGRAM (N/A) POSSIBLE NEEDLE CORE BIOPSY OF LIVER (N/A) REPAIR OF INCISIONAL HERNIA (N/A) as a surgical intervention.  The patient's history has been reviewed, patient examined, no change in status, stable for surgery.  I have reviewed the patient's chart and labs.  Questions were answered to the patient's satisfaction.    I have re-reviewed the the patient's records, history, medications, and allergies.  I have re-examined the patient.  I again discussed intraoperative plans and goals of post-operative recovery.  The patient agrees to proceed.  Heather [name]  1934/08/03 [id number]  Patient Care Team: Mosie Lukes, MD as PCP - General (Family Medicine) [name], MD as PCP - Cardiology (Cardiology) Idelle Leech, [place] (Optometry) Janyth Contes, MD as Consulting Physician (Obstetrics and Gynecology) Trula Slade, DPM as Consulting Physician (Podiatry) Sharmon Revere as Physician Assistant (Cardiology) [name], PharmD (Pharmacist) Michael Boston, MD as Consulting Physician (General Surgery) Jaquita Folds, MD as Consulting Physician (Gynecology) Armbruster, Carlota Raspberry, MD as Consulting Physician (Gastroenterology)  Patient Active Problem List   Diagnosis Date Noted   Stress reaction of bone 01/05/2021   Pain of right sternoclavicular joint 01/05/2021   Screening for osteoporosis 01/05/2021   RUQ pain 12/01/2020   Calculus of gallbladder with cholecystitis without biliary obstruction 12/01/2020    Low back pain radiating to right leg 07/14/2020   Pelvic relaxation due to rectocele 04/06/2020   Prolapse of female pelvic organs 12/22/2019   Pelvic relaxation 12/22/2019   Pelvic prolapse 03/08/2019   Pain of breast 01/29/2019   Overactive bladder 01/29/2019   Educated about COVID-19 virus infection 12/02/2018   Hyperlipidemia associated with type 2 diabetes mellitus ([place]) 05/27/2017   Headache 04/17/2015   Multinodular goiter 01/17/2015   Peroneal DVT (deep venous thrombosis) ([place]) 12/22/2014   S/P total knee arthroplasty 11/29/2014   Insomnia 02/05/2014   Preventative health care 11/22/2013   RLS (restless legs syndrome) 10/04/2013   Lower urinary tract infectious disease 10/04/2013   Cataracts, bilateral 04/02/2013   Hypokalemia 01/05/2012   Anemia 01/05/2012   Depression with anxiety 01/05/2012   Postoperative anemia due to acute blood loss 11/15/2011   Staphylococcus aureus carrier 11/15/2011   Pre-syncope 10/13/2011   DM (diabetes mellitus) ([place]) 10/06/2011   Pulmonary nodule 10/06/2011   Epigastric pain 10/06/2011   It band syndrome, right 08/28/2011   Renal insufficiency 08/28/2011   CORONARY ATHEROSCLEROSIS NATIVE CORONARY ARTERY 03/07/2010   Coronary atherosclerosis 01/18/2009   FATIGUE 01/18/2009   PERSISTENT DISORDER INITIATING/MAINTAINING SLEEP 09/09/2008   DERMATITIS 10/16/2007   PERIPHERAL EDEMA 10/16/2007   Essential hypertension 04/14/2007   GERD 04/14/2007    Past Medical History:  Diagnosis Date   Chronic stable angina [facility])    Coronary artery disease cardiologist--- dr Burt Knack   hx NSTEMI  w/ cardiac cath 03-16-2005 PCI with DES to LCx;   cardiac cath 06-27-2005  PCI with DES to RCA with residual dx LAD manage medcially;  lexiscan 01-26-20211 normal no ischemia, ef 70%;  stress echo w/ dobutamine 10-24-2011 negative ishcmeia , normal ef // Myoview 2/22: EF 67, no ischemia or infarction, no TID,  low risk    Diabetes mellitus type 2, diet-controlled  ([place])    followed by pcp  (10-19-2020  pt stated checks daily in am,  fasting blood surgar--- 115--120s)   DOE (dyspnea on exertion)    per pt "when I over do",  ok with household chores   Echocardiogram 08/2020    Echocardiogram 2/22: EF 55-60, no RWMA, mild LVH, Gr 2 DD, GLS-21.7%, normal RVSF, trivial MR, RVSP 39.5   Edema of right lower extremity    GERD (gastroesophageal reflux disease)    Hiatal hernia    recurrence,  hx HH repair 1989   History of cervical cancer    s/p  vaginal hysterectomy   History of DVT of lower extremity 2016   11-29-2014 post op right TKA of right lower extremity and completed xarelto    History of esophageal stricture    hx s/p dilatation's   History of gastric ulcer 2005 approx.   History of palpitations 2010   event monitor 07-07-2009 showed NSR w/ freq. SVT ectopies with short runs, rare PVCs   History of TIA (transient ischemic attack) 06/1999   12-15-2019  per pt had several TIA between 12/ 2000 to 02/ 2001 , was sent to specialist [facility]' , had test that was normal (10-19-2020 pt stated no TIAs since ) but has residual of essential tremors of right arm/ hand   Hypertension    Intermittent palpitations    IT band syndrome    Migraine    "ice pick headche lasts about 30 seconds"   Mixed hyperlipidemia    Mixed incontinence urge and stress    Multiple thyroid nodules    followed by pcp---   ultrasound 11-22-2014 no bx   (12-15-2019 per pt had a endocrinologist and was told did not need bx)   OA (osteoarthritis)    knees, elbow, hip, ankles   Occasional tremors    right arm/ hand  s/p TIA residual 2000   Osteoporosis    taking vitamin d   Right bundle branch block (RBBB) with left anterior fascicular block (LAFB)    RLS (restless legs syndrome)    S/P drug eluting coronary stent placement 2006   03-16-2005  PCI x1 DES to LCx;   06-27-2005  PCI x1 DES to RCA   Urinary retention    post op sling prodecure on 10-27-2020, has foley cathether     Past Surgical History:  Procedure Laterality Date   ANTERIOR AND POSTERIOR REPAIR N/A 12/22/2019   Procedure: ANTERIOR (CYSTOCELE)  REPAIR;  Surgeon: Janyth Contes, MD;  Location: [facility];  Service: Gynecology;  Laterality: N/A;   ANTERIOR AND POSTERIOR REPAIR WITH SACROSPINOUS FIXATION N/A 10/27/2020   Procedure: SACROSPINOUS LIGAMENT FIXATION;  Surgeon: Jaquita Folds, MD;  Location[facility];  Service: Gynecology;  Laterality: N/A;   BLADDER SUSPENSION N/A 10/27/2020   Procedure: TRANSVAGINAL TAPE (TVT) PROCEDURE;  Surgeon: Jaquita Folds, MD;  Location: [facility];  Service: Gynecology;  Laterality: N/A;   CATARACT EXTRACTION W/ INTRAOCULAR LENS  IMPLANT, BILATERAL  2015   COLONOSCOPY  last one ?   CORONARY ANGIOPLASTY WITH STENT PLACEMENT  03-16-2005   dr Lia Foyer   PCI and DES x1 to LCx   CORONARY ANGIOPLASTY WITH STENT PLACEMENT  06-27-2005  dr Lia Foyer   PCI and DES x1 to RCA with residual disease LAD 70-80% to manage medically   CYSTOSCOPY N/A 10/27/2020   Procedure: CYSTOSCOPY;  Surgeon: Jaquita Folds, MD;  Location: [facility];  Service: Gynecology;  Laterality: N/A;   CYSTOSCOPY N/A 11/09/2020   Procedure: CYSTOSCOPY;  Surgeon: Jaquita Folds, MD;  Location: [facility];  Service: Gynecology;  Laterality: N/A;   FOOT SURGERY Left 1990s   left foot stress fracture repair, per pt no hardware   HIATAL HERNIA REPAIR  1989   KNEE ARTHROSCOPY Bilateral right ?/   left x2 , last one 09-12-2009 @ [place]   PUBOVAGINAL SLING N/A 11/09/2020   Procedure: [place];  Surgeon: Jaquita Folds, MD;  Location: [facility];  Service: Gynecology;  Laterality: N/A;   RECTOCELE REPAIR N/A 04/20/2020   Procedure: POSTERIOR REPAIR (RECTOCELE);  Surgeon: Janyth Contes, MD;  Location: [facility];  Service: Gynecology;   Laterality: N/A;   RECTOCELE REPAIR N/A 10/27/2020   Procedure: POSTERIOR REPAIR (RECTOCELE);  Surgeon: Jaquita Folds, MD;  Location: [facility];  Service: Gynecology;  Laterality: N/A;  total time requested for all procedures is 2 hours   TOTAL KNEE ARTHROPLASTY  11/12/2011   Procedure: TOTAL KNEE ARTHROPLASTY;  Surgeon: Lorn Junes, MD;  Location: [facility];  Service: Orthopedics;  Laterality: Left;  Dr Noemi Chapel wants 90 minutes for this case   TOTAL KNEE ARTHROPLASTY Right 11/29/2014   Procedure: RIGHT TOTAL KNEE ARTHROPLASTY;  Surgeon: Vickey Huger, MD;  Location: [facility];  Service: Orthopedics;  Laterality: Right;   UPPER GASTROINTESTINAL ENDOSCOPY  last one 04-25-2017   with dilatation esophageal stricture and savary dilatation   VAGINAL HYSTERECTOMY  1988    no ovaries removed for bleeeding    Social History   Socioeconomic History   Marital status: Widowed    Spouse name: Not on file   Number of children: 3   Years of education: 38   Highest education level: Not on file  Occupational History   Occupation: works partime in Office manager: RETIRED    Comment: retired  Tobacco Use   Smoking status: Never   Smokeless tobacco: Never  Vaping Use   Vaping Use: Never used  Substance and Sexual Activity   Alcohol use: No    Alcohol/week: 0.0 standard drinks   Drug use: Never   Sexual activity: Not Currently    Birth control/protection: Surgical    Comment: lives alone  Other Topics Concern   Not on file  Social History Narrative   Lives with husband, Caffeine use- Half Caffeine)- 2 cups daily.  3 children living, one passed away.   Education: HS. Business college.  Retired.    Social Determinants of Health   Financial Resource Strain: Low Risk    Difficulty of Paying Living Expenses: Not hard at all  Food Insecurity: Not on file  Transportation Needs: Not on file  Physical Activity: Inactive   Days of Exercise per Week: 0 days   Minutes of Exercise  per Session: 0 min  Stress: Not on file  Social Connections: Not on file  Intimate Partner Violence: Not on file    Family History  Problem Relation Age of Onset   Stroke Father        family hx of M 1st degree relative <50   Coronary artery disease Mother    Heart disease Mother    Depression Brother    Stroke Brother    Diabetes Brother    Cancer Brother        bladder with mets   Diabetes Daughter  borderline   Hypertension Daughter    Arthritis Other        family hx of   Hypertension Other        family hx of   Other Other        family hx of cardiovascular disorder   Thyroid disease Daughter    Breast cancer Neg Hx    Colon cancer Neg Hx    Anesthesia problems Neg Hx    Hypotension Neg Hx    Malignant hyperthermia Neg Hx    Pseudochol deficiency Neg Hx    Colon polyps Neg Hx    Esophageal cancer Neg Hx    Rectal cancer Neg Hx    Stomach cancer Neg Hx     Medications Prior to Admission  Medication Sig Dispense Refill Last Dose   acetaminophen (TYLENOL) 500 MG tablet Take 1,000 mg by mouth every 6 (six) hours as needed.   01/25/2021 at 1030   amLODipine (NORVASC) 10 MG tablet Take 1 tablet (10 mg total) by mouth daily. (Patient taking differently: Take 10 mg by mouth in the morning.) 90 tablet 1 01/25/2021 at 0730   Ascorbic Acid (VITAMIN C) 500 MG CHEW Chew 500 mg by mouth daily.   Past Week   aspirin EC 81 MG tablet Take 1 tablet (81 mg total) by mouth daily. (Patient taking differently: Take 81 mg by mouth in the morning.)   01/24/2021 at 0500   Cholecalciferol (VITAMIN D) 50 MCG (2000 UT) CAPS Take 2,000 Units by mouth daily.    Past Week   diclofenac Sodium (VOLTAREN) 1 % GEL Apply 1 application topically 4 (four) times daily as needed (hip pain).   01/23/2021   famotidine (PEPCID) 20 MG tablet Take 20 mg by mouth at bedtime as needed for heartburn or indigestion.   Past Week   isosorbide mononitrate (IMDUR) 60 MG 24 hr tablet Take 1.5 tablets (90 mg total)  by mouth daily. (Patient taking differently: Take 90 mg by mouth every evening.) 135 tablet 3 01/24/2021 at 2230   losartan (COZAAR) 100 MG tablet Take 1 tablet (100 mg total) by mouth daily. (Patient taking differently: Take 100 mg by mouth in the morning.) 90 tablet 0 01/24/2021 at 0500   magnesium oxide (MAG-OX) 400 MG tablet Take 400 mg by mouth 2 (two) times daily.   01/24/2021 at 0500   metoprolol succinate (TOPROL-XL) 50 MG 24 hr tablet Take 1.5 tablets (75 mg total) by mouth at bedtime. Take with or immediately following a meal. 135 tablet 3 01/24/2021 at 2230   nitroGLYCERIN (NITROSTAT) 0.4 MG SL tablet Place 1 tablet (0.4 mg total) under the tongue every 5 (five) minutes as needed for chest pain. (Patient taking differently: Place 0.4 mg under the tongue every 5 (five) minutes x 3 doses as needed for chest pain.) 25 tablet 11    omeprazole (PRILOSEC) 20 MG capsule TAKE 1 CAPSULE EVERY DAY AS NEEDED (Patient taking differently: Take 20 mg by mouth in the morning.) 90 capsule 0 01/25/2021 at 1030   ondansetron (ZOFRAN ODT) 4 MG disintegrating tablet Take 1 tablet (4 mg total) by mouth every 6 (six) hours as needed for nausea or vomiting. 30 tablet 0 01/25/2021 at 1030   potassium chloride SA (KLOR-CON) 20 MEQ tablet Take 0.5 tablets (10 mEq total) by mouth daily. (Patient taking differently: Take 10 mEq by mouth every evening.) 45 tablet 2    pramipexole (MIRAPEX) 0.25 MG tablet Take 1 tablet (0.25 mg total) by mouth  2 (two) times daily. 180 tablet 1    simvastatin (ZOCOR) 20 MG tablet TAKE 1 TABLET AT BEDTIME (Patient taking differently: Take 20 mg by mouth every evening.) 90 tablet 2    spironolactone (ALDACTONE) 25 MG tablet Take 0.5 tablets (12.5 mg total) by mouth 3 (three) times a week. (Patient taking differently: Take 12.5 mg by mouth every Monday, Wednesday, and Friday.) 90 tablet 3    traMADol (ULTRAM) 50 MG tablet Take 50 mg by mouth every 12 (twelve) hours as needed for severe pain.    01/25/2021 at 1030   Zinc 50 MG TABS Take 50 mg by mouth daily.      Blood Glucose Monitoring Suppl (TRUE METRIX AIR GLUCOSE METER) w/Device KIT USE TO CHECK BLOOD SUGAR ONCE DAILY AND AS NEEDED.  DX CODE E11.9 1 kit 0    glucose blood (TRUE METRIX BLOOD GLUCOSE TEST) test strip USE TO CHECK BLOOD SUGAR ONCE A DAY OR AS NEEDED.  DX CODE: E11.9 200 each 1 Unknown   ketorolac (TORADOL) 10 MG tablet Take 1 tablet (10 mg total) by mouth every 6 (six) hours as needed. (Patient taking differently: Take 10 mg by mouth every 6 (six) hours as needed (pain).) 20 tablet 0 More than a month   mirabegron ER (MYRBETRIQ) 25 MG TB24 tablet Take 1 tablet (25 mg total) by mouth daily. 90 tablet 2    predniSONE (DELTASONE) 5 MG tablet Take 6 pills for first day, 5 pills second day, 4 pills third day, 3 pills fourth day, 2 pills the fifth day, and 1 pill sixth day. (Patient not taking: Reported on 01/18/2021) 21 tablet 0 Not Taking   TRUEplus Lancets 33G MISC USE TO CHECK BLOOD SUGAR ONCE DAILY AND AS NEEDED.  DX CODE: E11.9 200 each 1     Current Facility-Administered Medications  Medication Dose Route Frequency Provider Last Rate Last Admin   acetaminophen (TYLENOL) tablet 1,000 mg  1,000 mg Oral On Call to OR Michael Boston, MD       bupivacaine liposome (EXPAREL) 1.3 % injection 266 mg  20 mL Infiltration Once Michael Boston, MD       cefTRIAXone (ROCEPHIN) 2 g in sodium chloride 0.9 % 100 mL IVPB  2 g Intravenous On Call to OR Michael Boston, MD       And   metroNIDAZOLE (FLAGYL) IVPB 500 mg  500 mg Intravenous On Call to OR Michael Boston, MD       Chlorhexidine Gluconate Cloth 2 % PADS 6 each  6 each Topical Once Michael Boston, MD       And   Chlorhexidine Gluconate Cloth 2 % PADS 6 each  6 each Topical Once Michael Boston, MD       feeding supplement (ENSURE PRE-SURGERY) liquid 296 mL  296 mL Oral Once Michael Boston, MD       gabapentin (NEURONTIN) capsule 300 mg  300 mg Oral On Call to OR Michael Boston, MD        lactated ringers infusion   Intravenous Continuous Brennan Bailey, MD 10 mL/hr at 01/25/21 1320 New Bag at 01/25/21 1320     Allergies  Allergen Reactions   Baclofen Other (See Comments)    Hyperactivity    Nitrofurantoin Rash    BP (!) 161/62   Pulse (!) 56   Temp (!) 97.5 F (36.4 C) (Oral)   Resp 17   LMP  (LMP Unknown)   SpO2 97%   Labs: Results for orders  placed or performed during the hospital encounter of 01/24/21 (from the past 48 hour(s))  SARS CORONAVIRUS 2 (TAT 6-24 HRS) Nasopharyngeal Nasopharyngeal Swab     Status: None   Collection Time: 01/24/21  2:02 PM   Specimen: Nasopharyngeal Swab  Result Value Ref Range   SARS Coronavirus 2 NEGATIVE NEGATIVE    Comment: (NOTE) SARS-CoV-2 target nucleic acids are NOT DETECTED.  The SARS-CoV-2 RNA is generally detectable in upper and lower respiratory specimens during the acute phase of infection. Negative results do not preclude SARS-CoV-2 infection, do not rule out co-infections with other pathogens, and should not be used as the sole basis for treatment or other patient management decisions. Negative results must be combined with clinical observations, patient history, and epidemiological information. The expected result is Negative.  Fact Sheet for Patients: SugarRoll.be  Fact Sheet for Healthcare Providers: https://www.woods-mathews.com/  This test is not yet approved or cleared by the [place] FDA and  has been authorized for detection and/or diagnosis of SARS-CoV-2 by FDA under an Emergency Use Authorization (EUA). This EUA will remain  in effect (meaning this test can be used) for the duration of the COVID-19 declaration under Se ction 564(b)(1) of the Act, 21 U.S.C. section 360bbb-3(b)(1), unless the authorization is terminated or revoked sooner.  Performed at [facility], [place] [address], [place], [place] [address]     Imaging / Studies: No  results found.   [name], M.D., F.A.C.S. Gastrointestinal and Minimally Invasive Surgery [facility] [address], [place], [place] [address] [phone] Main / Paging  01/25/2021 1:31 PM    [name]

## 2021-01-26 ENCOUNTER — Encounter (HOSPITAL_COMMUNITY): Payer: Self-pay | Admitting: Surgery

## 2021-01-26 DIAGNOSIS — E119 Type 2 diabetes mellitus without complications: Secondary | ICD-10-CM | POA: Diagnosis not present

## 2021-01-26 DIAGNOSIS — I2510 Atherosclerotic heart disease of native coronary artery without angina pectoris: Secondary | ICD-10-CM | POA: Diagnosis not present

## 2021-01-26 DIAGNOSIS — K449 Diaphragmatic hernia without obstruction or gangrene: Secondary | ICD-10-CM | POA: Diagnosis not present

## 2021-01-26 DIAGNOSIS — K8010 Calculus of gallbladder with chronic cholecystitis without obstruction: Secondary | ICD-10-CM | POA: Diagnosis not present

## 2021-01-26 DIAGNOSIS — R2681 Unsteadiness on feet: Secondary | ICD-10-CM | POA: Diagnosis not present

## 2021-01-26 DIAGNOSIS — I10 Essential (primary) hypertension: Secondary | ICD-10-CM | POA: Diagnosis not present

## 2021-01-26 DIAGNOSIS — K430 Incisional hernia with obstruction, without gangrene: Secondary | ICD-10-CM | POA: Diagnosis not present

## 2021-01-26 DIAGNOSIS — Z79899 Other long term (current) drug therapy: Secondary | ICD-10-CM | POA: Diagnosis not present

## 2021-01-26 LAB — CBC
HCT: 35.4 % — ABNORMAL LOW (ref 36.0–46.0)
Hemoglobin: 11.6 g/dL — ABNORMAL LOW (ref 12.0–15.0)
MCH: 30.4 pg (ref 26.0–34.0)
MCHC: 32.8 g/dL (ref 30.0–36.0)
MCV: 92.9 fL (ref 80.0–100.0)
Platelets: 133 10*3/uL — ABNORMAL LOW (ref 150–400)
RBC: 3.81 MIL/uL — ABNORMAL LOW (ref 3.87–5.11)
RDW: 12.4 % (ref 11.5–15.5)
WBC: 13.7 10*3/uL — ABNORMAL HIGH (ref 4.0–10.5)
nRBC: 0 % (ref 0.0–0.2)

## 2021-01-26 LAB — GLUCOSE, CAPILLARY
Glucose-Capillary: 141 mg/dL — ABNORMAL HIGH (ref 70–99)
Glucose-Capillary: 219 mg/dL — ABNORMAL HIGH (ref 70–99)
Glucose-Capillary: 184 mg/dL — ABNORMAL HIGH (ref 70–99)

## 2021-01-26 LAB — BASIC METABOLIC PANEL
Anion gap: 10 (ref 5–15)
BUN: 22 mg/dL (ref 8–23)
CO2: 23 mmol/L (ref 22–32)
Calcium: 8.8 mg/dL — ABNORMAL LOW (ref 8.9–10.3)
Chloride: 103 mmol/L (ref 98–111)
Creatinine, Ser: 1.05 mg/dL — ABNORMAL HIGH (ref 0.44–1.00)
GFR, Estimated: 52 mL/min — ABNORMAL LOW (ref >60)
Glucose, Bld: 264 mg/dL — ABNORMAL HIGH (ref 70–99)
Potassium: 4.7 mmol/L (ref 3.5–5.1)
Sodium: 136 mmol/L (ref 135–145)

## 2021-01-26 LAB — MAGNESIUM: Magnesium: 1.5 mg/dL — ABNORMAL LOW (ref 1.7–2.4)

## 2021-01-26 MED ORDER — INSULIN ASPART 100 UNIT/ML IJ SOLN: 0.0000 [IU] | Freq: Every day | INTRAMUSCULAR | Status: DC

## 2021-01-26 MED ORDER — PHENOL 1.4 % MT LIQD: 1.0000 | OROMUCOSAL | Status: DC | PRN

## 2021-01-26 MED ORDER — TRAMADOL HCL 50 MG PO TABS: 50.0000 mg | ORAL_TABLET | Freq: Four times a day (QID) | ORAL | 0 refills | Status: DC | PRN

## 2021-01-26 MED ORDER — MAGIC MOUTHWASH
15.0000 mL | Freq: Three times a day (TID) | ORAL | Status: DC
  Administered 2021-01-26: 15 mL via ORAL
  Filled 2021-01-26 (×2): qty 15

## 2021-01-26 MED ORDER — INSULIN ASPART 100 UNIT/ML IJ SOLN
0.0000 [IU] | Freq: Three times a day (TID) | INTRAMUSCULAR | Status: DC
  Administered 2021-01-26 (×2): 5 [IU] via SUBCUTANEOUS

## 2021-01-26 NOTE — Progress Notes (Signed)
Dr Johney Maine notified of very sm opening of incision along umbilicus (approx 1.5 cm), with a few drops of serosanguineous drainage. Noted pt earlier in the day was wearing very hi-waisted tight panties that landed across pt's incision. Instructed pt/dtr to NOT wear any kind of waistband across her belly/incisional area. Recommended that pt wear only loose fitting dresses for the next several weeks. Per MD request, applied neosporin oint and a 6 X 6 mepilex dsg over the entire incision. Pt/daughter instructed to leave this in place until Monday. Also instructed no soaking in the bath tub until this incision heals. Reviewed written d/c instructions w pt/dtr and all questions answered. They both verbalized understanding. D/c per w/c w all belongings in stable condition.

## 2021-01-26 NOTE — Evaluation (Signed)
Occupational Therapy Evaluation Patient Details Name: Heather [name] MRN: [id number] DOB: 1934/10/03 Today's Date: 01/26/2021    History of Present Illness Patient is an 86 year old woman s/p Laparoscopic cholecystectomy with intraoperative cholangiogram and Reduction and primary repair of incarcerated incisional hernias.   Clinical Impression   Heather [name] is an 86 year old woman who presents s/p surgery with abdominal incision. On evaluation she demonstrates the ability to ambulate without a device though has a tendency to steady herself on furniture. She demonstrates ability to perform all ADLs with increased time and different positioning as needed to reduce abdominal discomfort. Patient educated on activity restriction to protect surgery site. Patient and daughter verbalized understanding. Patient's daughter will be staying with patient to assist as needed. Recommend initially to use shower chair for safety No further OT needs at this time.     Follow Up Recommendations  No OT follow up    Equipment Recommendations  Tub/shower seat    Recommendations for Other Services       Precautions / Restrictions Precautions Precautions: Other (comment) Precaution Comments: abdominal incision Restrictions Weight Bearing Restrictions: No Other Position/Activity Restrictions: Per RN 10 lb weight lifting restriction      Mobility Bed Mobility Overal bed mobility: Modified Independent                  Transfers Overall transfer level: Needs assistance Equipment used: None Transfers: Sit to/from Stand           General transfer comment: Supervison for all ambulation but no physical assistance. Patient intermittently using hand to steady herself on furniture. She has a cane or walker if needed at home. Reports ambulating in hall with nursing.    Balance Overall balance assessment: Mild deficits observed, not formally tested                                          ADL either performed or assessed with clinical judgement   ADL Overall ADL's : Modified independent                                       General ADL Comments: Increased time or different positioning needed to protect abdomen. Otherwise able to perform.     Vision Patient Visual Report: No change from baseline       Perception     Praxis      Pertinent Vitals/Pain Pain Assessment: No/denies pain     Hand Dominance Right   Extremity/Trunk Assessment Upper Extremity Assessment Upper Extremity Assessment: Overall WFL for tasks assessed   Lower Extremity Assessment Lower Extremity Assessment: Defer to PT evaluation   Cervical / Trunk Assessment Cervical / Trunk Assessment: Normal   Communication Communication Communication: No difficulties   Cognition Arousal/Alertness: Awake/alert Behavior During Therapy: WFL for tasks assessed/performed Overall Cognitive Status: Within Functional Limits for tasks assessed                                     General Comments       Exercises     Shoulder Instructions      Home Living Family/patient expects to be discharged to:: Private residence Living Arrangements: Alone Available Help at Discharge: Family;Available  24 hours/day Type of Home: House Home Access: Stairs to enter [employer] of Steps: 1   Home Layout: One level     Bathroom Shower/Tub: Teacher, early years/pre: Handicapped height     Home Equipment: Environmental consultant - 2 wheels;Cane - single point;Bedside commode          Prior Functioning/Environment Level of Independence: Independent        Comments: Her daughter will be staying with her at discharge.        OT Problem List: Decreased activity tolerance;Impaired balance (sitting and/or standing)      OT Treatment/Interventions:      OT Goals(Current goals can be found in the care plan section) Acute Rehab OT Goals OT Goal  Formulation: All assessment and education complete, DC therapy  OT Frequency:     Barriers to D/C:            Co-evaluation              AM-PAC OT "6 Clicks" Daily Activity     Outcome Measure Help from another person eating meals?: None Help from another person taking care of personal grooming?: None Help from another person toileting, which includes using toliet, bedpan, or urinal?: None Help from another person bathing (including washing, rinsing, drying)?: None Help from another person to put on and taking off regular upper body clothing?: None Help from another person to put on and taking off regular lower body clothing?: None 6 Click Score: 24   End of Session    Activity Tolerance: Patient tolerated treatment well Patient left: in bed;with call bell/phone within reach;with family/visitor present  OT Visit Diagnosis: Pain                Time: 9794-8016 OT Time Calculation (min): 11 min Charges:  OT General Charges $OT Visit: 1 Visit OT Evaluation $OT Eval Low Complexity: 1 Low  Samira Acero, OTR/L [place]  Office [phone] Pager: [phone]   Lenward Chancellor 01/26/2021, 10:52 AM

## 2021-01-26 NOTE — Progress Notes (Addendum)
SHERISSA GRAVELY [id number] [birth year]  CARE TEAM:  PCP: Mosie Lukes, MD  Outpatient Care Team: Patient Care Team: Mosie Lukes, MD as PCP - General (Family Medicine) [name], MD as PCP - Cardiology (Cardiology) Idelle Leech, [place] (Optometry) Janyth Contes, MD as Consulting Physician (Obstetrics and Gynecology) Trula Slade, DPM as Consulting Physician (Podiatry) Sharmon Revere as Physician Assistant (Cardiology) [name], PharmD (Pharmacist) Michael Boston, MD as Consulting Physician (General Surgery) Jaquita Folds, MD as Consulting Physician (Gynecology) Armbruster, Carlota Raspberry, MD as Consulting Physician (Gastroenterology)  Inpatient Treatment Team: Treatment Team: Attending Provider: Michael Boston, MD; Registered Nurse: Kurtis Bushman, RN; Charge Nurse: Charlyne Petrin, RN; Pharmacist: Dimple Nanas, [facility]   Problem List:   Principal Problem:   Incarcerated incisional hernia s/p primary repair 01/25/2021 Active Problems:   Essential hypertension   Gastroesophageal reflux disease   Renal insufficiency   DM (diabetes mellitus) ([place])   Hyperglycemia   Mixed anxiety and depressive disorder   Chronic calculous cholecystitis s/p lap cholecystectomy 01/25/2021   1 Day Post-Op  01/25/2021  POST-OPERATIVE DIAGNOSIS:    Chronic Calculus cholecystitis Incarcerated incisional hernias   PROCEDURE:  Laparoscopic cholecystectomy with intraoperative cholangiogram (CPT code [phone]) Reduction and primary repair of incarcerated incisional hernias   SURGEON:  [name], MD, FACS.  OR FINDINGS:  Dilated boggy gallbladder with thickening consistent with chronic cholecystitis.  Very narrow cystic duct with stricturing suspicious for chronic partial cystic duct outlet obstruction.  Cholangiogram with dilated intra and extrahepatic ducts most likely consistent with advanced age.  No evidence of choledocholithiasis nor leak.   Liver:  normal no needle biopsy done   Supraumbilical 7 x 3 cm region of incisional hernia incarcerated with omentum.  Largest periumbilical.  Reduction debridement and primary repair done.  No mesh repair given the fact cholecystectomy was done at the same time.  Assessment  Fair but close to baseline.  [facility] Stay = 0 days)  Plan:  -Hyperglycemia with untreated diabetes.  She notes she has fluctuations and is not that bad.  Placed on sliding scale insulin and follow  Mobilize.  Have physical and Occupational Therapy help evaluate given her advanced age and suboptimal mobilization.   Magic mouthwash for sore throat Continue H2 blockers for chronic GERD.  Maalox for breakthrough heartburn  Hypertension control  Follow-up on pathology.  -VTE prophylaxis- SCDs, etc  -mobilize as tolerated to help recovery  Hopefully if her sugars under better control mobilize as well, can go home later today.  We will see.  Disposition:  Disposition:  The patient is from: Home  Anticipate discharge to:  Home with Home Health  Anticipated Date of Discharge is:  January 21,2022    Barriers to discharge:  Pending Clinical improvement (more likely than not)  Patient currently is NOT MEDICALLY STABLE for discharge from the hospital from a surgery standpoint.      25 minutes spent in review, evaluation, examination, counseling, and coordination of care.   I have reviewed this patient's available data, including medical history, events of note, physical examination and test results as part of my evaluation.  A significant portion of that time was spent in counseling.  Care during the described time interval was provided by me.  01/26/2021    Subjective: (Chief complaint)  Planing of sore throat with mild nausea yesterday.  Feels better now.  Glucoses markedly elevated.  Patient notes she used to be on metformin but was held given to  some hypoglycemic events.  Objective:  Vital  signs:  Vitals:   01/25/21 2029 01/25/21 2136 01/26/21 0219 01/26/21 0529  BP: (!) 127/55 (!) 128/52 (!) 130/59 122/60  Pulse: 70 68 65 61  Resp: '18 18 18 18  '$ Temp: 97.7 F (36.5 C) 97.6 F (36.4 C) 98.6 F (37 C) 98.4 F (36.9 C)  TempSrc: Oral Oral Oral Oral  SpO2: 93% 94% 94% 94%    Last BM Date: 01/25/21  Intake/Output   Yesterday:  07/20 0701 - 07/21 0700 In: 2320 [P.O.:720; I.V.:1400; IV Piggyback:200] Out: 1250 [Urine:1200; Blood:50] This shift:  No intake/output data recorded.  Bowel function:  Flatus: YES  BM:  No  Drain: (No drain)   Physical Exam:  General: Pt sleeping at first but awakens to be alert in no acute distress Eyes: PERRL, normal EOM.  Sclera clear.  No icterus Neuro: CN II-XII intact w/o focal sensory/motor deficits. Lymph: No head/neck/groin lymphadenopathy Psych:  No delerium/psychosis/paranoia.  Oriented x 4 HENT: Normocephalic, Mucus membranes moist.  No thrush Neck: Supple, No tracheal deviation.  No obvious thyromegaly Chest: No pain to chest wall compression.  Good respiratory excursion.  No audible wheezing CV:  Pulses intact.  Regular rhythm.  No major extremity edema MS: Normal AROM mjr joints.  No obvious deformity  Abdomen: Soft.  Mildy distended.  Mildly tender at incisions only.  Dressings clean dry and intact.  No evidence of peritonitis.  No incarcerated hernias.  Ext:   No deformity.  No mjr edema.  No cyanosis Skin: No petechiae / purpurea.  No major sores.  Warm and dry    Results:   Cultures: Recent Results (from the past 720 hour(s))  SARS CORONAVIRUS 2 (TAT 6-24 HRS) Nasopharyngeal Nasopharyngeal Swab     Status: None   Collection Time: 01/24/21  2:02 PM   Specimen: Nasopharyngeal Swab  Result Value Ref Range Status   SARS Coronavirus 2 NEGATIVE NEGATIVE Final    Comment: (NOTE) SARS-CoV-2 target nucleic acids are NOT DETECTED.  The SARS-CoV-2 RNA is generally detectable in upper and lower respiratory  specimens during the acute phase of infection. Negative results do not preclude SARS-CoV-2 infection, do not rule out co-infections with other pathogens, and should not be used as the sole basis for treatment or other patient management decisions. Negative results must be combined with clinical observations, patient history, and epidemiological information. The expected result is Negative.  Fact Sheet for Patients: SugarRoll.be  Fact Sheet for Healthcare Providers: https://www.woods-mathews.com/  This test is not yet approved or cleared by the [place] FDA and  has been authorized for detection and/or diagnosis of SARS-CoV-2 by FDA under an Emergency Use Authorization (EUA). This EUA will remain  in effect (meaning this test can be used) for the duration of the COVID-19 declaration under Se ction 564(b)(1) of the Act, 21 U.S.C. section 360bbb-3(b)(1), unless the authorization is terminated or revoked sooner.  Performed at [facility], [place] [address] [place], [place] [address]     Labs: Results for orders placed or performed during the hospital encounter of 01/25/21 (from the past 48 hour(s))  Glucose, capillary     Status: Abnormal   Collection Time: 01/25/21  1:01 PM  Result Value Ref Range   Glucose-Capillary 141 (H) 70 - 99 mg/dL    Comment: Glucose reference range applies only to samples taken after fasting for at least 8 hours.  Basic metabolic panel     Status: Abnormal   Collection Time: 01/26/21  4:13 AM  Result Value Ref Range   Sodium 136 135 - 145 mmol/L   Potassium 4.7 3.5 - 5.1 mmol/L   Chloride 103 98 - 111 mmol/L   CO2 23 22 - 32 mmol/L   Glucose, Bld 264 (H) 70 - 99 mg/dL    Comment: Glucose reference range applies only to samples taken after fasting for at least 8 hours.   BUN 22 8 - 23 mg/dL   Creatinine, Ser 1.05 (H) 0.44 - 1.00 mg/dL   Calcium 8.8 (L) 8.9 - 10.3 mg/dL   GFR, Estimated 52 (L) >60  mL/min    Comment: (NOTE) Calculated using the CKD-EPI Creatinine Equation (2021)    Anion gap 10 5 - 15    Comment: Performed at [facility], [place] [address], [place], [place] [address]  Magnesium     Status: Abnormal   Collection Time: 01/26/21  4:13 AM  Result Value Ref Range   Magnesium 1.5 (L) 1.7 - 2.4 mg/dL    Comment: Performed at [facility], [place] [address], [place], [place] [address]  CBC     Status: Abnormal   Collection Time: 01/26/21  4:13 AM  Result Value Ref Range   WBC 13.7 (H) 4.0 - 10.5 K/uL   RBC 3.81 (L) 3.87 - 5.11 MIL/uL   Hemoglobin 11.6 (L) 12.0 - 15.0 g/dL   HCT 35.4 (L) 36.0 - 46.0 %   MCV 92.9 80.0 - 100.0 fL   MCH 30.4 26.0 - 34.0 pg   MCHC 32.8 30.0 - 36.0 g/dL   RDW 12.4 11.5 - 15.5 %   Platelets 133 (L) 150 - 400 K/uL   nRBC 0.0 0.0 - 0.2 %    Comment: Performed at [facility], [place] [address], [place],  [address]  Glucose, capillary     Status: Abnormal   Collection Time: 01/26/21  7:43 AM  Result Value Ref Range   Glucose-Capillary 219 (H) 70 - 99 mg/dL    Comment: Glucose reference range applies only to samples taken after fasting for at least 8 hours.    Imaging / Studies: DG Cholangiogram Operative  Result Date: 01/25/2021 CLINICAL DATA:  Intraoperative cholangiogram EXAM: INTRAOPERATIVE CHOLANGIOGRAM TECHNIQUE: Cholangiographic images from the C-arm fluoroscopic device were submitted for interpretation post-operatively. Please see the procedural report for the amount of contrast and the fluoroscopy time utilized. COMPARISON:  CT abdomen/pelvis 11/24/2020 FINDINGS: Intraoperative cine clips are submitted for review. The images demonstrate cannulation of the cystic duct remanent with intraoperative cholangiogram. No evidence of biliary ductal dilatation, stenosis or stricture. No evidence of filling defect to suggest choledocholithiasis. Contrast material passes through the ampulla  and into the duodenum. IMPRESSION: Negative intraoperative cholangiogram. Electronically Signed   By: Jacqulynn Cadet M.D.   On: 01/25/2021 15:51    Medications / Allergies: per chart  Antibiotics: Anti-infectives (From admission, onward)    Start     Dose/Rate Route Frequency Ordered Stop   01/25/21 2200  ceFAZolin (ANCEF) 2 g in sodium chloride 0.9 % 100 mL IVPB        2 g 200 mL/hr over 30 Minutes Intravenous Every 8 hours 01/25/21 1747 01/25/21 2344   01/25/21 1330  cefTRIAXone (ROCEPHIN) 2 g in sodium chloride 0.9 % 100 mL IVPB       See Hyperspace for full Linked Orders Report.   2 g 200 mL/hr over 30 Minutes Intravenous On call to O.R. 01/25/21 1252 01/25/21 1430   01/25/21 1330  metroNIDAZOLE (FLAGYL) IVPB 500 mg       See Hyperspace for full Linked Orders Report.   500 mg 100 mL/hr over 60 Minutes Intravenous On call to O.R. 01/25/21 1252 01/25/21 1430         Note: Portions of this report may have been transcribed using voice recognition software. Every effort was made to ensure accuracy; however, inadvertent computerized transcription errors may be present.   Any transcriptional errors that result from this process are unintentional.    [name], MD, FACS, MASCRS Esophageal, Gastrointestinal & Colorectal Surgery Robotic and Minimally Invasive Surgery  [facility], [place]  [place] [place], [place] [address] [phone] Fax [phone] Main  CONTACT INFORMATION:  Weekday (9AM-5PM): Call [facility] main office at [phone]  Weeknight (5PM-9AM) or Weekend/Holiday: Check [email or web address] (password " [email or web address]") for General Surgery [facility] coverage  (Please, do not use SecureChat as it is not reliable communication to operating surgeons for immediate patient care)      01/26/2021  7:45 AM

## 2021-01-26 NOTE — Discharge Summary (Signed)
Physician Discharge Summary    Patient ID: Heather [name] MRN: [id number] DOB/AGE: [birth year]  86 y.o.  Patient Care Team: Mosie Lukes, MD as PCP - General (Family Medicine) [name], MD as PCP - Cardiology (Cardiology) Idelle Leech, OD (Optometry) Janyth Contes, MD as Consulting Physician (Obstetrics and Gynecology) Trula Slade, DPM as Consulting Physician (Podiatry) Sharmon Revere as Physician Assistant (Cardiology) [name], PharmD (Pharmacist) Michael Boston, MD as Consulting Physician (General Surgery) Jaquita Folds, MD as Consulting Physician (Gynecology) Armbruster, Carlota Raspberry, MD as Consulting Physician (Gastroenterology)  Admit date: 01/25/2021  Discharge date: 01/26/2021  Hospital Stay = 0 days    Discharge Diagnoses:  Principal Problem:   Incarcerated incisional hernia s/p primary repair 01/25/2021 Active Problems:   Essential hypertension   Gastroesophageal reflux disease   Renal insufficiency   DM (diabetes mellitus) ([place])   Hyperglycemia   Mixed anxiety and depressive disorder   Chronic calculous cholecystitis s/p lap cholecystectomy 01/25/2021   1 Day Post-Op  01/25/2021  POST-OPERATIVE DIAGNOSIS:    Chronic Calculus cholecystitis Incarcerated incisional hernias   PROCEDURE:  Laparoscopic cholecystectomy with intraoperative cholangiogram (CPT code [id number]) Reduction and primary repair of incarcerated incisional hernias   SURGEON:  [name], MD, FACS.   OR FINDINGS:  Dilated boggy gallbladder with thickening consistent with chronic cholecystitis.  Very narrow cystic duct with stricturing suspicious for chronic partial cystic duct outlet obstruction.  Cholangiogram with dilated intra and extrahepatic ducts most likely consistent with advanced age.  No evidence of choledocholithiasis nor leak.   Liver: normal no needle biopsy done   Supraumbilical 7 x 3 cm region of incisional hernia incarcerated with  omentum.  Largest periumbilical.  Reduction debridement and primary repair done.  No mesh repair given the fact cholecystectomy was done at the same time.  Consults: [place] [facility] Course:   The patient underwent the surgery above.  Postoperatively, the patient gradually mobilized and advanced to a solid diet.  Pain and other symptoms were treated aggressively.    By the time of discharge, the patient was walking well the hallways, eating food, having flatus.  Pain was well-controlled on an oral medications.  Based on meeting discharge criteria and continuing to recover, I felt it was safe for the patient to be discharged from the hospital to further recover with close followup. Postoperative recommendations were discussed in detail.  They are written as well.  Discharged Condition: good  Discharge Exam: Blood pressure (!) 109/53, pulse (!) 55, temperature 97.7 F (36.5 C), temperature source Oral, resp. rate 14, SpO2 91 %.  General: Pt awake/alert/oriented x4 in No acute distress Eyes: PERRL, normal EOM.  Sclera clear.  No icterus Neuro: CN II-XII intact w/o focal sensory/motor deficits. Lymph: No head/neck/groin lymphadenopathy Psych:  No delerium/psychosis/paranoia HENT: Normocephalic, Mucus membranes moist.  No thrush Neck: Supple, No tracheal deviation Chest:  No chest wall pain w good excursion CV:  Pulses intact.  Regular rhythm MS: Normal AROM mjr joints.  No obvious deformity Abdomen: Soft.  Nondistended.  Mildly tender at incisions only.  No evidence of peritonitis.  No incarcerated hernias. Ext:  SCDs BLE.  No mjr edema.  No cyanosis Skin: No petechiae / purpura   Disposition:    Follow-up Information     Michael Boston, MD Follow up.   Specialties: General Surgery, Colon and Rectal Surgery Contact information: [address] [place] [address] [phone]  Discharge disposition: 01-Home or Self  Care             Significant Diagnostic Studies:  Results for orders placed or performed during the hospital encounter of 01/25/21 (from the past 72 hour(s))  Glucose, capillary     Status: Abnormal   Collection Time: 01/25/21  1:01 PM  Result Value Ref Range   Glucose-Capillary 141 (H) 70 - 99 mg/dL    Comment: Glucose reference range applies only to samples taken after fasting for at least 8 hours.  Basic metabolic panel     Status: Abnormal   Collection Time: 01/26/21  4:13 AM  Result Value Ref Range   Sodium 136 135 - 145 mmol/L   Potassium 4.7 3.5 - 5.1 mmol/L   Chloride 103 98 - 111 mmol/L   CO2 23 22 - 32 mmol/L   Glucose, Bld 264 (H) 70 - 99 mg/dL    Comment: Glucose reference range applies only to samples taken after fasting for at least 8 hours.   BUN 22 8 - 23 mg/dL   Creatinine, Ser 1.05 (H) 0.44 - 1.00 mg/dL   Calcium 8.8 (L) 8.9 - 10.3 mg/dL   GFR, Estimated 52 (L) >60 mL/min    Comment: (NOTE) Calculated using the CKD-EPI Creatinine Equation (2021)    Anion gap 10 5 - 15    Comment: Performed at [facility], [place] [address] [place], [place] [address]  Magnesium     Status: Abnormal   Collection Time: 01/26/21  4:13 AM  Result Value Ref Range   Magnesium 1.5 (L) 1.7 - 2.4 mg/dL    Comment: Performed at [facility], [place] [address], [place], [place] [address]  CBC     Status: Abnormal   Collection Time: 01/26/21  4:13 AM  Result Value Ref Range   WBC 13.7 (H) 4.0 - 10.5 K/uL   RBC 3.81 (L) 3.87 - 5.11 MIL/uL   Hemoglobin 11.6 (L) 12.0 - 15.0 g/dL   HCT 35.4 (L) 36.0 - 46.0 %   MCV 92.9 80.0 - 100.0 fL   MCH 30.4 26.0 - 34.0 pg   MCHC 32.8 30.0 - 36.0 g/dL   RDW 12.4 11.5 - 15.5 %   Platelets 133 (L) 150 - 400 K/uL   nRBC 0.0 0.0 - 0.2 %    Comment: Performed at [facility], [place] [address] [place], [place] [address]  Glucose, capillary     Status: Abnormal   Collection Time: 01/26/21   7:43 AM  Result Value Ref Range   Glucose-Capillary 219 (H) 70 - 99 mg/dL    Comment: Glucose reference range applies only to samples taken after fasting for at least 8 hours.  Glucose, capillary     Status: Abnormal   Collection Time: 01/26/21 11:32 AM  Result Value Ref Range   Glucose-Capillary 184 (H) 70 - 99 mg/dL    Comment: Glucose reference range applies only to samples taken after fasting for at least 8 hours.    DG Cholangiogram Operative  Result Date: 01/25/2021 CLINICAL DATA:  Intraoperative cholangiogram EXAM: INTRAOPERATIVE CHOLANGIOGRAM TECHNIQUE: Cholangiographic images from the C-arm fluoroscopic device were submitted for interpretation post-operatively. Please see the procedural report for the amount of contrast and the fluoroscopy time utilized. COMPARISON:  CT abdomen/pelvis 11/24/2020 FINDINGS: Intraoperative cine clips are submitted for review. The images demonstrate cannulation of the cystic duct remanent with intraoperative cholangiogram. No evidence of biliary ductal dilatation, stenosis or stricture. No evidence of  filling defect to suggest choledocholithiasis. Contrast material passes through the ampulla and into the duodenum. IMPRESSION: Negative intraoperative cholangiogram. Electronically Signed   By: Jacqulynn Cadet M.D.   On: 01/25/2021 15:51    Past Medical History:  Diagnosis Date   Chronic stable angina [facility])    Coronary artery disease cardiologist--- dr Burt Knack   hx NSTEMI  w/ cardiac cath 03-16-2005 PCI with DES to LCx;   cardiac cath 06-27-2005  PCI with DES to RCA with residual dx LAD manage medcially;  lexiscan 01-26-20211 normal no ischemia, ef 70%;  stress echo w/ dobutamine 10-24-2011 negative ishcmeia , normal ef // Myoview 2/22: EF 67, no ischemia or infarction, no TID, low risk    Diabetes mellitus type 2, diet-controlled ([place])    followed by pcp  (10-19-2020  pt stated checks daily in am,  fasting blood surgar--- 115--120s)   DOE (dyspnea on  exertion)    per pt "when I over do",  ok with household chores   Echocardiogram 08/2020    Echocardiogram 2/22: EF 55-60, no RWMA, mild LVH, Gr 2 DD, GLS-21.7%, normal RVSF, trivial MR, RVSP 39.5   Edema of right lower extremity    GERD (gastroesophageal reflux disease)    Hiatal hernia    recurrence,  hx HH repair 1989   History of cervical cancer    s/p  vaginal hysterectomy   History of DVT of lower extremity 2016   11-29-2014 post op right TKA of right lower extremity and completed xarelto    History of esophageal stricture    hx s/p dilatation's   History of gastric ulcer 2005 approx.   History of palpitations 2010   event monitor 07-07-2009 showed NSR w/ freq. SVT ectopies with short runs, rare PVCs   History of TIA (transient ischemic attack) 06/1999   12-15-2019  per pt had several TIA between 12/ 2000 to 02/ 2001 , was sent to specialist @[facility] , had test that was normal (10-19-2020 pt stated no TIAs since ) but has residual of essential tremors of right arm/ hand   Hypertension    Intermittent palpitations    IT band syndrome    Migraine    "ice pick headche lasts about 30 seconds"   Mixed hyperlipidemia    Mixed incontinence urge and stress    Multiple thyroid nodules    followed by pcp---   ultrasound 11-22-2014 no bx   (12-15-2019 per pt had a endocrinologist and was told did not need bx)   OA (osteoarthritis)    knees, elbow, hip, ankles   Occasional tremors    right arm/ hand  s/p TIA residual 2000   Osteoporosis    taking vitamin d   Peroneal DVT (deep venous thrombosis) ([place]) 12/22/2014   Right bundle branch block (RBBB) with left anterior fascicular block (LAFB)    RLS (restless legs syndrome)    S/P drug eluting coronary stent placement 2006   03-16-2005  PCI x1 DES to LCx;   06-27-2005  PCI x1 DES to RCA   Urinary retention    post op sling prodecure on 10-27-2020, has foley cathether    Past Surgical History:  Procedure Laterality Date   ANTERIOR AND  POSTERIOR REPAIR N/A 12/22/2019   Procedure: ANTERIOR (CYSTOCELE)  REPAIR;  Surgeon: Janyth Contes, MD;  Location: [facility];  Service: Gynecology;  Laterality: N/A;   ANTERIOR AND POSTERIOR REPAIR WITH SACROSPINOUS FIXATION N/A 10/27/2020   Procedure: SACROSPINOUS LIGAMENT FIXATION;  Surgeon: Jaquita Folds,  MD;  Location: [place];  Service: Gynecology;  Laterality: N/A;   BLADDER SUSPENSION N/A 10/27/2020   Procedure: TRANSVAGINAL TAPE (TVT) PROCEDURE;  Surgeon: Jaquita Folds, MD;  Location: [facility];  Service: Gynecology;  Laterality: N/A;   CATARACT EXTRACTION W/ INTRAOCULAR LENS  IMPLANT, BILATERAL  2015   CHOLECYSTECTOMY N/A 01/25/2021   Procedure: LAPAROSCOPIC CHOLECYSTECTOMY WITH INTRAOPERATIVE CHOLANGIOGRAM AND LYSIS OF ADHESIONS;  Surgeon: Michael Boston, MD;  Location: [facility];  Service: General;  Laterality: N/A;   COLONOSCOPY  last one ?   CORONARY ANGIOPLASTY WITH STENT PLACEMENT  03-16-2005   dr Lia Foyer   PCI and DES x1 to LCx   CORONARY ANGIOPLASTY WITH STENT PLACEMENT  06-27-2005  dr Lia Foyer   PCI and DES x1 to RCA with residual disease LAD 70-80% to manage medically   CYSTOSCOPY N/A 10/27/2020   Procedure: CYSTOSCOPY;  Surgeon: Jaquita Folds, MD;  Location[facility];  Service: Gynecology;  Laterality: N/A;   CYSTOSCOPY N/A 11/09/2020   Procedure: CYSTOSCOPY;  Surgeon: Jaquita Folds, MD;  Location: [facility];  Service: Gynecology;  Laterality: N/A;   FOOT SURGERY Left 1990s   left foot stress fracture repair, per pt no hardware   HIATAL HERNIA REPAIR  1989   INCISIONAL HERNIA REPAIR N/A 01/25/2021   Procedure: PRIMARY REPAIR OF INCISIONAL HERNIA;  Surgeon: Michael Boston, MD;  Location: [facility];  Service: General;  Laterality: N/A;   KNEE ARTHROSCOPY Bilateral right ?/   left x2 , last one 09-12-2009 @ [facility]   PUBOVAGINAL SLING N/A 11/09/2020   Procedure: [place];  Surgeon: Jaquita Folds, MD;  Location[facility];  Service: Gynecology;  Laterality: N/A;   RECTOCELE REPAIR N/A 04/20/2020   Procedure: POSTERIOR REPAIR (RECTOCELE);  Surgeon: Janyth Contes, MD;  Location: [facility];  Service: Gynecology;  Laterality: N/A;   RECTOCELE REPAIR N/A 10/27/2020   Procedure: POSTERIOR REPAIR (RECTOCELE);  Surgeon: Jaquita Folds, MD;  Location: [facility];  Service: Gynecology;  Laterality: N/A;  total time requested for all procedures is 2 hours   TOTAL KNEE ARTHROPLASTY  11/12/2011   Procedure: TOTAL KNEE ARTHROPLASTY;  Surgeon: Lorn Junes, MD;  Location: [place];  Service: Orthopedics;  Laterality: Left;  Dr Noemi Chapel wants 90 minutes for this case   TOTAL KNEE ARTHROPLASTY Right 11/29/2014   Procedure: RIGHT TOTAL KNEE ARTHROPLASTY;  Surgeon: Vickey Huger, MD;  Location: [place];  Service: Orthopedics;  Laterality: Right;   UPPER GASTROINTESTINAL ENDOSCOPY  last one 04-25-2017   with dilatation esophageal stricture and savary dilatation   VAGINAL HYSTERECTOMY  1988    no ovaries removed for bleeeding    Social History   Socioeconomic History   Marital status: Widowed    Spouse name: Not on file   Number of children: 3   Years of education: 54   Highest education level: Not on file  Occupational History   Occupation: works partime in Office manager: RETIRED    Comment: retired  Tobacco Use   Smoking status: Never   Smokeless tobacco: Never  Vaping Use   Vaping Use: Never used  Substance and Sexual Activity   Alcohol use: No    Alcohol/week: 0.0 standard drinks   Drug use: Never   Sexual activity: Not Currently    Birth control/protection: Surgical    Comment: lives alone  Other Topics Concern   Not  on file  Social History Narrative   Lives with husband, Caffeine use- Half Caffeine)- 2 cups daily.  3 children living, one passed away.   Education: HS.  Business college.  Retired.    Social Determinants of Health   Financial Resource Strain: Low Risk    Difficulty of Paying Living Expenses: Not hard at all  Food Insecurity: Not on file  Transportation Needs: Not on file  Physical Activity: Inactive   Days of Exercise per Week: 0 days   Minutes of Exercise per Session: 0 min  Stress: Not on file  Social Connections: Not on file  Intimate Partner Violence: Not on file    Family History  Problem Relation Age of Onset   Stroke Father        family hx of M 1st degree relative <50   Coronary artery disease Mother    Heart disease Mother    Depression Brother    Stroke Brother    Diabetes Brother    Cancer Brother        bladder with mets   Diabetes Daughter        borderline   Hypertension Daughter    Arthritis Other        family hx of   Hypertension Other        family hx of   Other Other        family hx of cardiovascular disorder   Thyroid disease Daughter    Breast cancer Neg Hx    Colon cancer Neg Hx    Anesthesia problems Neg Hx    Hypotension Neg Hx    Malignant hyperthermia Neg Hx    Pseudochol deficiency Neg Hx    Colon polyps Neg Hx    Esophageal cancer Neg Hx    Rectal cancer Neg Hx    Stomach cancer Neg Hx     Current Facility-Administered Medications  Medication Dose Route Frequency Provider Last Rate Last Admin   0.9 %  sodium chloride infusion  250 mL Intravenous PRN Michael Boston, MD       acetaminophen (TYLENOL) tablet 1,000 mg  1,000 mg Oral Q6H Michael Boston, MD   1,000 mg at 01/26/21 1205   amLODipine (NORVASC) tablet 10 mg  10 mg Oral q AM Michael Boston, MD   10 mg at 01/26/21 3299   ascorbic acid (VITAMIN C) tablet 500 mg  500 mg Oral Daily Michael Boston, MD   500 mg at 01/26/21 1009   aspirin EC tablet 81 mg  81 mg Oral q AM Michael Boston, MD   81 mg at 01/26/21 2426   bisacodyl (DULCOLAX) suppository 10 mg  10 mg Rectal Daily PRN Michael Boston, MD       cholecalciferol (VITAMIN D3) tablet  2,000 Units  2,000 Units Oral Daily Michael Boston, MD   2,000 Units at 01/26/21 1009   diclofenac Sodium (VOLTAREN) 1 % topical gel 1 application  1 application Topical QID PRN Michael Boston, MD       diphenhydrAMINE (BENADRYL) 12.5 MG/5ML elixir 12.5 mg  12.5 mg Oral Q6H PRN Michael Boston, MD       Or   diphenhydrAMINE (BENADRYL) injection 12.5 mg  12.5 mg Intravenous Q6H PRN Michael Boston, MD       enalaprilat (VASOTEC) injection 0.625-1.25 mg  0.625-1.25 mg Intravenous Q6H PRN Michael Boston, MD       enoxaparin (LOVENOX) injection 40 mg  40 mg Subcutaneous Q24H Michael Boston, MD   40 mg  at 01/26/21 0808   famotidine (PEPCID) tablet 20 mg  20 mg Oral QHS PRN Michael Boston, MD       gabapentin (NEURONTIN) capsule 300 mg  300 mg Oral BID Michael Boston, MD   300 mg at 01/26/21 1009   insulin aspart (novoLOG) injection 0-15 Units  0-15 Units Subcutaneous TID WC Michael Boston, MD   5 Units at 01/26/21 1206   insulin aspart (novoLOG) injection 0-5 Units  0-5 Units Subcutaneous QHS Michael Boston, MD       isosorbide mononitrate (IMDUR) 24 hr tablet 90 mg  90 mg Oral QPM Michael Boston, MD   90 mg at 01/25/21 2106   ketorolac (TORADOL) tablet 10 mg  10 mg Oral Q6H PRN Thomes Lolling, RPH       lactated ringers bolus 1,000 mL  1,000 mL Intravenous Q8H PRN Michael Boston, MD       lip balm (CARMEX) ointment 1 application  1 application Topical BID Michael Boston, MD   1 application at 32/67/12 1011   losartan (COZAAR) tablet 100 mg  100 mg Oral q AM Michael Boston, MD   100 mg at 01/26/21 4580   magic mouthwash  15 mL Oral TID Michael Boston, MD   15 mL at 01/26/21 1010   magnesium hydroxide (MILK OF MAGNESIA) suspension 30 mL  30 mL Oral Daily PRN Michael Boston, MD       magnesium oxide (MAG-OX) tablet 400 mg  400 mg Oral BID Michael Boston, MD   400 mg at 01/26/21 1011   methocarbamol (ROBAXIN) tablet 500 mg  500 mg Oral Q6H PRN Michael Boston, MD       metoprolol succinate (TOPROL-XL) 24 hr tablet 75 mg   75 mg Oral Ardeen Fillers, MD   75 mg at 01/25/21 2106   metoprolol tartrate (LOPRESSOR) injection 5 mg  5 mg Intravenous Q6H PRN Michael Boston, MD       mirabegron ER [facility]) tablet 25 mg  25 mg Oral Daily Michael Boston, MD   25 mg at 01/26/21 1010   nitroGLYCERIN (NITROSTAT) SL tablet 0.4 mg  0.4 mg Sublingual Q5 Min x 3 PRN Michael Boston, MD       ondansetron (ZOFRAN-ODT) disintegrating tablet 4 mg  4 mg Oral Q6H PRN Michael Boston, MD       pantoprazole (PROTONIX) EC tablet 40 mg  40 mg Oral Daily Michael Boston, MD   40 mg at 01/26/21 1009   phenol (CHLORASEPTIC) mouth spray 1 spray  1 spray Mouth/Throat PRN Michael Boston, MD       potassium chloride (KLOR-CON) CR tablet 10 mEq  10 mEq Oral QPM Michael Boston, MD   10 mEq at 01/25/21 2049   pramipexole (MIRAPEX) tablet 0.25 mg  0.25 mg Oral BID Michael Boston, MD   0.25 mg at 01/26/21 1009   simethicone (MYLICON) chewable tablet 40 mg  40 mg Oral Q6H PRN Michael Boston, MD       simvastatin (ZOCOR) tablet 20 mg  20 mg Oral QPM Michael Boston, MD   20 mg at 01/25/21 2048   sodium chloride flush (NS) 0.9 % injection 3 mL  3 mL Intravenous Gorden Harms, MD       sodium chloride flush (NS) 0.9 % injection 3 mL  3 mL Intravenous PRN Michael Boston, MD       spironolactone (ALDACTONE) tablet 12.5 mg  12.5 mg Oral Q M,W,F Michael Boston, MD   12.5 mg at  01/25/21 2047   traMADol (ULTRAM) tablet 50 mg  50 mg Oral Q12H PRN Michael Boston, MD       traMADol Veatrice Bourbon) tablet 50 mg  50 mg Oral Q6H PRN Michael Boston, MD   50 mg at 01/25/21 6659   zinc sulfate capsule 220 mg  220 mg Oral Daily Michael Boston, MD   220 mg at 01/26/21 1009     Allergies  Allergen Reactions   Baclofen Other (See Comments)    Hyperactivity    Nitrofurantoin Rash    Signed: Morton Peters, MD, FACS, MASCRS Esophageal, Gastrointestinal & Colorectal Surgery Robotic and Minimally Invasive Surgery  [facility], [place]  [address], [place], [place] [address] [phone] Fax [phone] Main  CONTACT INFORMATION:  Weekday (9AM-5PM): Call [facility] main office at [phone]  Weeknight (5PM-9AM) or Weekend/Holiday: Check [email or web address] (password " [email or web address]") for General Surgery [facility] coverage  (Please, do not use SecureChat as it is not reliable communication to operating surgeons for immediate patient care)      01/26/2021, 2:50 PM

## 2021-01-26 NOTE — Anesthesia Postprocedure Evaluation (Signed)
Anesthesia Post Note  Patient: ASHLEN KIGER  Procedure(s) Performed: LAPAROSCOPIC CHOLECYSTECTOMY WITH INTRAOPERATIVE CHOLANGIOGRAM AND LYSIS OF ADHESIONS (Abdomen) PRIMARY REPAIR OF INCISIONAL HERNIA (Abdomen)     Patient location during evaluation: PACU Anesthesia Type: General Level of consciousness: awake and alert Pain management: pain level controlled Vital Signs Assessment: post-procedure vital signs reviewed and stable Respiratory status: spontaneous breathing, nonlabored ventilation, respiratory function stable and patient connected to nasal cannula oxygen Cardiovascular status: blood pressure returned to baseline and stable Postop Assessment: no apparent nausea or vomiting Anesthetic complications: no   No notable events documented.  Last Vitals:  Vitals:   01/26/21 0219 01/26/21 0529  BP: (!) 130/59 122/60  Pulse: 65 61  Resp: 18 18  Temp: 37 C 36.9 C  SpO2: 94% 94%    Last Pain:  Vitals:   01/26/21 0724  TempSrc:   PainSc: 0-No pain                 Merlinda Frederick

## 2021-01-27 LAB — SURGICAL PATHOLOGY

## 2021-01-28 ENCOUNTER — Emergency Department (HOSPITAL_COMMUNITY): Payer: Medicare HMO

## 2021-01-28 ENCOUNTER — Other Ambulatory Visit: Payer: Self-pay

## 2021-01-28 ENCOUNTER — Inpatient Hospital Stay (HOSPITAL_COMMUNITY)
Admission: EM | Admit: 2021-01-28 | Discharge: 2021-02-02 | DRG: 291 | Disposition: A | Discharge status: Home Health | Payer: Medicare HMO | Attending: Internal Medicine | Admitting: Internal Medicine

## 2021-01-28 DIAGNOSIS — E871 Hypo-osmolality and hyponatremia: Secondary | ICD-10-CM | POA: Diagnosis not present

## 2021-01-28 DIAGNOSIS — Z8673 Personal history of transient ischemic attack (TIA), and cerebral infarction without residual deficits: Secondary | ICD-10-CM | POA: Diagnosis not present

## 2021-01-28 DIAGNOSIS — R630 Anorexia: Secondary | ICD-10-CM | POA: Diagnosis present

## 2021-01-28 DIAGNOSIS — Z888 Allergy status to other drugs, medicaments and biological substances status: Secondary | ICD-10-CM

## 2021-01-28 DIAGNOSIS — Z955 Presence of coronary angioplasty implant and graft: Secondary | ICD-10-CM | POA: Diagnosis not present

## 2021-01-28 DIAGNOSIS — Z8541 Personal history of malignant neoplasm of cervix uteri: Secondary | ICD-10-CM

## 2021-01-28 DIAGNOSIS — G8918 Other acute postprocedural pain: Secondary | ICD-10-CM | POA: Diagnosis not present

## 2021-01-28 DIAGNOSIS — Z9889 Other specified postprocedural states: Secondary | ICD-10-CM | POA: Diagnosis not present

## 2021-01-28 DIAGNOSIS — I2510 Atherosclerotic heart disease of native coronary artery without angina pectoris: Secondary | ICD-10-CM | POA: Diagnosis present

## 2021-01-28 DIAGNOSIS — I110 Hypertensive heart disease with heart failure: Secondary | ICD-10-CM | POA: Diagnosis not present

## 2021-01-28 DIAGNOSIS — R531 Weakness: Secondary | ICD-10-CM | POA: Diagnosis not present

## 2021-01-28 DIAGNOSIS — E1121 Type 2 diabetes mellitus with diabetic nephropathy: Secondary | ICD-10-CM | POA: Diagnosis not present

## 2021-01-28 DIAGNOSIS — J9601 Acute respiratory failure with hypoxia: Secondary | ICD-10-CM | POA: Diagnosis not present

## 2021-01-28 DIAGNOSIS — Z96651 Presence of right artificial knee joint: Secondary | ICD-10-CM | POA: Diagnosis present

## 2021-01-28 DIAGNOSIS — R059 Cough, unspecified: Secondary | ICD-10-CM | POA: Diagnosis not present

## 2021-01-28 DIAGNOSIS — N179 Acute kidney failure, unspecified: Secondary | ICD-10-CM | POA: Diagnosis present

## 2021-01-28 DIAGNOSIS — R1312 Dysphagia, oropharyngeal phase: Secondary | ICD-10-CM | POA: Diagnosis not present

## 2021-01-28 DIAGNOSIS — Z6829 Body mass index (BMI) 29.0-29.9, adult: Secondary | ICD-10-CM

## 2021-01-28 DIAGNOSIS — Z79899 Other long term (current) drug therapy: Secondary | ICD-10-CM

## 2021-01-28 DIAGNOSIS — Z86718 Personal history of other venous thrombosis and embolism: Secondary | ICD-10-CM | POA: Diagnosis not present

## 2021-01-28 DIAGNOSIS — Z823 Family history of stroke: Secondary | ICD-10-CM

## 2021-01-28 DIAGNOSIS — N390 Urinary tract infection, site not specified: Secondary | ICD-10-CM | POA: Diagnosis not present

## 2021-01-28 DIAGNOSIS — R1310 Dysphagia, unspecified: Secondary | ICD-10-CM | POA: Diagnosis not present

## 2021-01-28 DIAGNOSIS — I5033 Acute on chronic diastolic (congestive) heart failure: Secondary | ICD-10-CM | POA: Diagnosis not present

## 2021-01-28 DIAGNOSIS — E1122 Type 2 diabetes mellitus with diabetic chronic kidney disease: Secondary | ICD-10-CM | POA: Diagnosis not present

## 2021-01-28 DIAGNOSIS — F410 Panic disorder [episodic paroxysmal anxiety] without agoraphobia: Secondary | ICD-10-CM | POA: Diagnosis present

## 2021-01-28 DIAGNOSIS — K219 Gastro-esophageal reflux disease without esophagitis: Secondary | ICD-10-CM | POA: Diagnosis present

## 2021-01-28 DIAGNOSIS — K761 Chronic passive congestion of liver: Secondary | ICD-10-CM | POA: Diagnosis present

## 2021-01-28 DIAGNOSIS — K5730 Diverticulosis of large intestine without perforation or abscess without bleeding: Secondary | ICD-10-CM | POA: Diagnosis not present

## 2021-01-28 DIAGNOSIS — B9689 Other specified bacterial agents as the cause of diseases classified elsewhere: Secondary | ICD-10-CM | POA: Diagnosis not present

## 2021-01-28 DIAGNOSIS — E1165 Type 2 diabetes mellitus with hyperglycemia: Secondary | ICD-10-CM | POA: Diagnosis present

## 2021-01-28 DIAGNOSIS — R0902 Hypoxemia: Secondary | ICD-10-CM | POA: Diagnosis not present

## 2021-01-28 DIAGNOSIS — I700 Atherosclerosis of aorta: Secondary | ICD-10-CM | POA: Diagnosis not present

## 2021-01-28 DIAGNOSIS — Z20822 Contact with and (suspected) exposure to covid-19: Secondary | ICD-10-CM | POA: Diagnosis not present

## 2021-01-28 DIAGNOSIS — R188 Other ascites: Secondary | ICD-10-CM | POA: Diagnosis not present

## 2021-01-28 DIAGNOSIS — K5900 Constipation, unspecified: Secondary | ICD-10-CM | POA: Diagnosis present

## 2021-01-28 DIAGNOSIS — I10 Essential (primary) hypertension: Secondary | ICD-10-CM | POA: Diagnosis not present

## 2021-01-28 DIAGNOSIS — Z8249 Family history of ischemic heart disease and other diseases of the circulatory system: Secondary | ICD-10-CM

## 2021-01-28 DIAGNOSIS — G2581 Restless legs syndrome: Secondary | ICD-10-CM | POA: Diagnosis present

## 2021-01-28 DIAGNOSIS — J9811 Atelectasis: Secondary | ICD-10-CM | POA: Diagnosis not present

## 2021-01-28 DIAGNOSIS — N189 Chronic kidney disease, unspecified: Secondary | ICD-10-CM | POA: Diagnosis present

## 2021-01-28 DIAGNOSIS — E782 Mixed hyperlipidemia: Secondary | ICD-10-CM | POA: Diagnosis present

## 2021-01-28 DIAGNOSIS — K224 Dyskinesia of esophagus: Secondary | ICD-10-CM | POA: Diagnosis not present

## 2021-01-28 DIAGNOSIS — R109 Unspecified abdominal pain: Secondary | ICD-10-CM | POA: Diagnosis not present

## 2021-01-28 DIAGNOSIS — E119 Type 2 diabetes mellitus without complications: Secondary | ICD-10-CM

## 2021-01-28 DIAGNOSIS — Z818 Family history of other mental and behavioral disorders: Secondary | ICD-10-CM

## 2021-01-28 DIAGNOSIS — Z833 Family history of diabetes mellitus: Secondary | ICD-10-CM

## 2021-01-28 DIAGNOSIS — I130 Hypertensive heart and chronic kidney disease with heart failure and stage 1 through stage 4 chronic kidney disease, or unspecified chronic kidney disease: Principal | ICD-10-CM | POA: Diagnosis present

## 2021-01-28 DIAGNOSIS — K430 Incisional hernia with obstruction, without gangrene: Secondary | ICD-10-CM | POA: Diagnosis not present

## 2021-01-28 DIAGNOSIS — R0602 Shortness of breath: Secondary | ICD-10-CM | POA: Diagnosis not present

## 2021-01-28 DIAGNOSIS — M810 Age-related osteoporosis without current pathological fracture: Secondary | ICD-10-CM | POA: Diagnosis present

## 2021-01-28 DIAGNOSIS — K8010 Calculus of gallbladder with chronic cholecystitis without obstruction: Secondary | ICD-10-CM | POA: Diagnosis not present

## 2021-01-28 DIAGNOSIS — I5031 Acute diastolic (congestive) heart failure: Secondary | ICD-10-CM | POA: Diagnosis not present

## 2021-01-28 DIAGNOSIS — Z7982 Long term (current) use of aspirin: Secondary | ICD-10-CM

## 2021-01-28 DIAGNOSIS — N3001 Acute cystitis with hematuria: Secondary | ICD-10-CM | POA: Diagnosis not present

## 2021-01-28 DIAGNOSIS — R609 Edema, unspecified: Secondary | ICD-10-CM | POA: Diagnosis not present

## 2021-01-28 LAB — CBC WITH DIFFERENTIAL/PLATELET
Abs Immature Granulocytes: 0.04 10*3/uL (ref 0.00–0.07)
Basophils Absolute: 0 10*3/uL (ref 0.0–0.1)
Basophils Relative: 0 %
Eosinophils Absolute: 0.1 10*3/uL (ref 0.0–0.5)
Eosinophils Relative: 1 %
HCT: 36 % (ref 36.0–46.0)
Hemoglobin: 11.9 g/dL — ABNORMAL LOW (ref 12.0–15.0)
Immature Granulocytes: 0 %
Lymphocytes Relative: 9 %
Lymphs Abs: 0.8 10*3/uL (ref 0.7–4.0)
MCH: 31.1 pg (ref 26.0–34.0)
MCHC: 33.1 g/dL (ref 30.0–36.0)
MCV: 94 fL (ref 80.0–100.0)
Monocytes Absolute: 0.8 10*3/uL (ref 0.1–1.0)
Monocytes Relative: 8 %
Neutro Abs: 7.7 10*3/uL (ref 1.7–7.7)
Neutrophils Relative %: 82 %
Platelets: 143 10*3/uL — ABNORMAL LOW (ref 150–400)
RBC: 3.83 MIL/uL — ABNORMAL LOW (ref 3.87–5.11)
RDW: 12.8 % (ref 11.5–15.5)
WBC: 9.4 10*3/uL (ref 4.0–10.5)
nRBC: 0 % (ref 0.0–0.2)

## 2021-01-28 LAB — COMPREHENSIVE METABOLIC PANEL
ALT: 80 U/L — ABNORMAL HIGH (ref 0–44)
AST: 59 U/L — ABNORMAL HIGH (ref 15–41)
Albumin: 3.6 g/dL (ref 3.5–5.0)
Alkaline Phosphatase: 134 U/L — ABNORMAL HIGH (ref 38–126)
Anion gap: 10 (ref 5–15)
BUN: 25 mg/dL — ABNORMAL HIGH (ref 8–23)
CO2: 25 mmol/L (ref 22–32)
Calcium: 9 mg/dL (ref 8.9–10.3)
Chloride: 98 mmol/L (ref 98–111)
Creatinine, Ser: 1.08 mg/dL — ABNORMAL HIGH (ref 0.44–1.00)
GFR, Estimated: 51 mL/min — ABNORMAL LOW (ref >60)
Glucose, Bld: 129 mg/dL — ABNORMAL HIGH (ref 70–99)
Potassium: 5.1 mmol/L (ref 3.5–5.1)
Sodium: 133 mmol/L — ABNORMAL LOW (ref 135–145)
Total Bilirubin: 1.2 mg/dL (ref 0.3–1.2)
Total Protein: 6.6 g/dL (ref 6.5–8.1)

## 2021-01-28 LAB — URINALYSIS, ROUTINE W REFLEX MICROSCOPIC
Bilirubin Urine: NEGATIVE
Glucose, UA: NEGATIVE mg/dL
Hgb urine dipstick: NEGATIVE
Ketones, ur: NEGATIVE mg/dL
Leukocytes,Ua: NEGATIVE
Nitrite: NEGATIVE
Protein, ur: NEGATIVE mg/dL
Specific Gravity, Urine: 1.011 (ref 1.005–1.030)
pH: 6 (ref 5.0–8.0)

## 2021-01-28 LAB — RESP PANEL BY RT-PCR (FLU A&B, COVID) ARPGX2
Influenza A by PCR: NEGATIVE
Influenza B by PCR: NEGATIVE
SARS Coronavirus 2 by RT PCR: NEGATIVE

## 2021-01-28 LAB — LIPASE, BLOOD: Lipase: 25 U/L (ref 11–51)

## 2021-01-28 LAB — TROPONIN I (HIGH SENSITIVITY): Troponin I (High Sensitivity): 10 ng/L (ref <18)

## 2021-01-28 MED ORDER — SODIUM CHLORIDE 0.9% FLUSH: 3.0000 mL | INTRAVENOUS | Status: DC | PRN

## 2021-01-28 MED ORDER — IOHEXOL 350 MG/ML SOLN
80.0000 mL | Freq: Once | INTRAVENOUS | Status: AC | PRN
  Administered 2021-01-28: 80 mL via INTRAVENOUS

## 2021-01-28 MED ORDER — SPIRONOLACTONE 12.5 MG HALF TABLET
12.5000 mg | ORAL_TABLET | ORAL | Status: DC
  Administered 2021-01-30: 12.5 mg via ORAL
  Filled 2021-01-28: qty 1

## 2021-01-28 MED ORDER — MIRABEGRON ER 25 MG PO TB24
25.0000 mg | ORAL_TABLET | Freq: Every day | ORAL | Status: DC
  Administered 2021-01-29 – 2021-02-02 (×5): 25 mg via ORAL
  Filled 2021-01-28 (×5): qty 1

## 2021-01-28 MED ORDER — ASPIRIN EC 81 MG PO TBEC
81.0000 mg | DELAYED_RELEASE_TABLET | Freq: Every day | ORAL | Status: DC
  Administered 2021-01-29 – 2021-02-02 (×5): 81 mg via ORAL
  Filled 2021-01-28 (×5): qty 1

## 2021-01-28 MED ORDER — LOSARTAN POTASSIUM 50 MG PO TABS
100.0000 mg | ORAL_TABLET | Freq: Every day | ORAL | Status: DC
  Administered 2021-01-29 – 2021-01-30 (×2): 100 mg via ORAL
  Filled 2021-01-28: qty 4
  Filled 2021-01-28: qty 2

## 2021-01-28 MED ORDER — AMLODIPINE BESYLATE 10 MG PO TABS
10.0000 mg | ORAL_TABLET | Freq: Every day | ORAL | Status: DC
  Administered 2021-01-29 – 2021-01-30 (×2): 10 mg via ORAL
  Filled 2021-01-28: qty 2
  Filled 2021-01-28: qty 1

## 2021-01-28 MED ORDER — FUROSEMIDE 10 MG/ML IJ SOLN
40.0000 mg | Freq: Every day | INTRAMUSCULAR | Status: DC
  Administered 2021-01-29: 40 mg via INTRAVENOUS
  Filled 2021-01-28: qty 4

## 2021-01-28 MED ORDER — ACETAMINOPHEN 325 MG PO TABS
650.0000 mg | ORAL_TABLET | ORAL | Status: DC | PRN
  Administered 2021-01-30 – 2021-02-01 (×4): 650 mg via ORAL
  Filled 2021-01-28 (×4): qty 2

## 2021-01-28 MED ORDER — SODIUM CHLORIDE 0.9% FLUSH
3.0000 mL | Freq: Two times a day (BID) | INTRAVENOUS | Status: DC
  Administered 2021-01-29 – 2021-02-01 (×7): 3 mL via INTRAVENOUS

## 2021-01-28 MED ORDER — SODIUM CHLORIDE 0.9 % IV SOLN: 250.0000 mL | INTRAVENOUS | Status: DC | PRN

## 2021-01-28 MED ORDER — ONDANSETRON 4 MG PO TBDP: 4.0000 mg | ORAL_TABLET | Freq: Four times a day (QID) | ORAL | Status: DC | PRN

## 2021-01-28 MED ORDER — METOPROLOL SUCCINATE ER 50 MG PO TB24
75.0000 mg | ORAL_TABLET | Freq: Every day | ORAL | Status: DC
  Administered 2021-01-29 – 2021-02-01 (×4): 75 mg via ORAL
  Filled 2021-01-28 (×4): qty 1

## 2021-01-28 MED ORDER — FUROSEMIDE 10 MG/ML IJ SOLN
40.0000 mg | Freq: Once | INTRAMUSCULAR | Status: AC
  Administered 2021-01-28: 40 mg via INTRAVENOUS
  Filled 2021-01-28: qty 4

## 2021-01-28 MED ORDER — PANTOPRAZOLE SODIUM 40 MG PO TBEC
40.0000 mg | DELAYED_RELEASE_TABLET | Freq: Every day | ORAL | Status: DC
  Administered 2021-01-29 – 2021-02-02 (×5): 40 mg via ORAL
  Filled 2021-01-28 (×5): qty 1

## 2021-01-28 MED ORDER — POTASSIUM CHLORIDE CRYS ER 10 MEQ PO TBCR
10.0000 meq | EXTENDED_RELEASE_TABLET | Freq: Every evening | ORAL | Status: DC
  Administered 2021-01-29 – 2021-02-01 (×3): 10 meq via ORAL
  Filled 2021-01-28 (×5): qty 1

## 2021-01-28 MED ORDER — ISOSORBIDE MONONITRATE ER 60 MG PO TB24
90.0000 mg | ORAL_TABLET | Freq: Every day | ORAL | Status: DC
  Administered 2021-01-29: 90 mg via ORAL
  Filled 2021-01-28 (×2): qty 1

## 2021-01-28 MED ORDER — ENOXAPARIN SODIUM 40 MG/0.4ML IJ SOSY
40.0000 mg | PREFILLED_SYRINGE | INTRAMUSCULAR | Status: DC
  Administered 2021-01-29 – 2021-01-30 (×2): 40 mg via SUBCUTANEOUS
  Filled 2021-01-28 (×2): qty 0.4

## 2021-01-28 MED ORDER — TRAMADOL HCL 50 MG PO TABS
50.0000 mg | ORAL_TABLET | Freq: Four times a day (QID) | ORAL | Status: DC | PRN
Start: 2021-01-28 — End: 2021-01-29
  Filled 2021-01-28: qty 1

## 2021-01-28 MED ORDER — MAGNESIUM OXIDE 400 MG PO TABS: 400.0000 mg | ORAL_TABLET | Freq: Two times a day (BID) | ORAL | Status: DC

## 2021-01-28 MED ORDER — FAMOTIDINE 20 MG PO TABS: 20.0000 mg | ORAL_TABLET | Freq: Every evening | ORAL | Status: DC | PRN

## 2021-01-28 MED ORDER — SIMVASTATIN 20 MG PO TABS
20.0000 mg | ORAL_TABLET | Freq: Every day | ORAL | Status: DC
  Administered 2021-01-29 – 2021-02-01 (×4): 20 mg via ORAL
  Filled 2021-01-28 (×4): qty 1

## 2021-01-28 MED ORDER — MORPHINE SULFATE (PF) 4 MG/ML IV SOLN
4.0000 mg | INTRAVENOUS | Status: DC | PRN
  Administered 2021-01-28: 4 mg via INTRAVENOUS
  Filled 2021-01-28: qty 1

## 2021-01-28 MED ORDER — ONDANSETRON HCL 4 MG/2ML IJ SOLN
4.0000 mg | Freq: Four times a day (QID) | INTRAMUSCULAR | Status: DC | PRN
  Administered 2021-01-29 – 2021-01-30 (×2): 4 mg via INTRAVENOUS
  Filled 2021-01-28 (×2): qty 2

## 2021-01-28 NOTE — H&P (Signed)
History and Physical    Heather [name]:[id number] DOB: 01-Dec-1934 DOA: 01/28/2021  PCP: Mosie Lukes, MD  Patient coming from: Home  I have personally briefly reviewed patient's old medical records in [place]  Chief Complaint: SOB  HPI: Heather [name] is a 86 y.o. female with medical history significant of diet controlled DM2, HTN, prior DVT post knee replacement, HFpEF (grade 2 DD in Feb 22).  Pt just underwent lap chole + hernia repair surgery on 7/20, discharged 7/21.  Pt returns to ED with c/o SOB, hypoxia.  Has abd "soreness".  No BM since surgery.  No N/V.  Over past 24h has SOB with CP and tightness, worse when taking deep breaths.  Low grade temps at home.  No LE swelling.     ED Course: Satting in the 80s on RA, improved on 2L [place].  CTA chest/abd/pelvis: 1) no large central PE 2) mild pulm edema 3) fluid collection in gal bladder fossa  WBC nl  Tm 99.1  Given 16m IV lasix for CHF.  COVID neg.   Review of Systems: As per HPI, otherwise all review of systems negative.  Past Medical History:  Diagnosis Date   Chronic stable angina ([facility]    Coronary artery disease cardiologist--- dr cBurt Knack  hx NSTEMI  w/ cardiac cath 03-16-2005 PCI with DES to LCx;   cardiac cath 06-27-2005  PCI with DES to RCA with residual dx LAD manage medcially;  lexiscan 01-26-20211 normal no ischemia, ef 70%;  stress echo w/ dobutamine 10-24-2011 negative ishcmeia , normal ef // Myoview 2/22: EF 67, no ischemia or infarction, no TID, low risk    Diabetes mellitus type 2, diet-controlled ([place]    followed by pcp  (10-19-2020  pt stated checks daily in am,  fasting blood surgar--- 115--120s)   DOE (dyspnea on exertion)    per pt "when I over do",  ok with household chores   Echocardiogram 08/2020    Echocardiogram 2/22: EF 55-60, no RWMA, mild LVH, Gr 2 DD, GLS-21.7%, normal RVSF, trivial MR, RVSP 39.5   Edema of right lower extremity    GERD (gastroesophageal reflux  disease)    Hiatal hernia    recurrence,  hx HH repair 1989   History of cervical cancer    s/p  vaginal hysterectomy   History of DVT of lower extremity 2016   11-29-2014 post op right TKA of right lower extremity and completed xarelto    History of esophageal stricture    hx s/p dilatation's   History of gastric ulcer 2005 approx.   History of palpitations 2010   event monitor 07-07-2009 showed NSR w/ freq. SVT ectopies with short runs, rare PVCs   History of TIA (transient ischemic attack) 06/1999   12-15-2019  per pt had several TIA between 12/ 2000 to 02/ 2001 , was sent to specialist '@[facility]' , had test that was normal (10-19-2020 pt stated no TIAs since ) but has residual of essential tremors of right arm/ hand   Hypertension    Intermittent palpitations    IT band syndrome    Migraine    "ice pick headche lasts about 30 seconds"   Mixed hyperlipidemia    Mixed incontinence urge and stress    Multiple thyroid nodules    followed by pcp---   ultrasound 11-22-2014 no bx   (12-15-2019 per pt had a endocrinologist and was told did not need bx)   OA (osteoarthritis)    knees, elbow,  hip, ankles   Occasional tremors    right arm/ hand  s/p TIA residual 2000   Osteoporosis    taking vitamin d   Peroneal DVT (deep venous thrombosis) ([place]) 12/22/2014   Right bundle branch block (RBBB) with left anterior fascicular block (LAFB)    RLS (restless legs syndrome)    S/P drug eluting coronary stent placement 2006   03-16-2005  PCI x1 DES to LCx;   06-27-2005  PCI x1 DES to RCA   Urinary retention    post op sling prodecure on 10-27-2020, has foley cathether    Past Surgical History:  Procedure Laterality Date   ANTERIOR AND POSTERIOR REPAIR N/A 12/22/2019   Procedure: ANTERIOR (CYSTOCELE)  REPAIR;  Surgeon: Janyth Contes, MD;  Location: [facility];  Service: Gynecology;  Laterality: N/A;   ANTERIOR AND POSTERIOR REPAIR WITH SACROSPINOUS FIXATION N/A 10/27/2020    Procedure: SACROSPINOUS LIGAMENT FIXATION;  Surgeon: Jaquita Folds, MD;  Location: [facility];  Service: Gynecology;  Laterality: N/A;   BLADDER SUSPENSION N/A 10/27/2020   Procedure: TRANSVAGINAL TAPE (TVT) PROCEDURE;  Surgeon: Jaquita Folds, MD;  Location: [facility];  Service: Gynecology;  Laterality: N/A;   CATARACT EXTRACTION W/ INTRAOCULAR LENS  IMPLANT, BILATERAL  2015   CHOLECYSTECTOMY N/A 01/25/2021   Procedure: LAPAROSCOPIC CHOLECYSTECTOMY WITH INTRAOPERATIVE CHOLANGIOGRAM AND LYSIS OF ADHESIONS;  Surgeon: Michael Boston, MD;  Location: [facility];  Service: General;  Laterality: N/A;   COLONOSCOPY  last one ?   CORONARY ANGIOPLASTY WITH STENT PLACEMENT  03-16-2005   dr Lia Foyer   PCI and DES x1 to LCx   CORONARY ANGIOPLASTY WITH STENT PLACEMENT  06-27-2005  dr Lia Foyer   PCI and DES x1 to RCA with residual disease LAD 70-80% to manage medically   CYSTOSCOPY N/A 10/27/2020   Procedure: CYSTOSCOPY;  Surgeon: Jaquita Folds, MD;  Location[facility];  Service: Gynecology;  Laterality: N/A;   CYSTOSCOPY N/A 11/09/2020   Procedure: CYSTOSCOPY;  Surgeon: Jaquita Folds, MD;  Location: [facility];  Service: Gynecology;  Laterality: N/A;   FOOT SURGERY Left 1990s   left foot stress fracture repair, per pt no hardware   HIATAL HERNIA REPAIR  1989   INCISIONAL HERNIA REPAIR N/A 01/25/2021   Procedure: PRIMARY REPAIR OF INCISIONAL HERNIA;  Surgeon: Michael Boston, MD;  Location: [facility];  Service: General;  Laterality: N/A;   KNEE ARTHROSCOPY Bilateral right ?/   left x2 , last one 09-12-2009 @ [facility]   PUBOVAGINAL SLING N/A 11/09/2020   Procedure: [place];  Surgeon: Jaquita Folds, MD;  Location: [facility];  Service: Gynecology;  Laterality: N/A;   RECTOCELE REPAIR N/A 04/20/2020   Procedure: POSTERIOR REPAIR (RECTOCELE);  Surgeon: Janyth Contes, MD;  Location:  [facility];  Service: Gynecology;  Laterality: N/A;   RECTOCELE REPAIR N/A 10/27/2020   Procedure: POSTERIOR REPAIR (RECTOCELE);  Surgeon: Jaquita Folds, MD;  Location: [facility];  Service: Gynecology;  Laterality: N/A;  total time requested for all procedures is 2 hours   TOTAL KNEE ARTHROPLASTY  11/12/2011   Procedure: TOTAL KNEE ARTHROPLASTY;  Surgeon: Lorn Junes, MD;  Location: [facility];  Service: Orthopedics;  Laterality: Left;  Dr Noemi Chapel wants 90 minutes for this case   TOTAL KNEE ARTHROPLASTY Right 11/29/2014   Procedure: RIGHT TOTAL KNEE ARTHROPLASTY;  Surgeon: Vickey Huger, MD;  Location: [place];  Service: Orthopedics;  Laterality:  Right;   UPPER GASTROINTESTINAL ENDOSCOPY  last one 04-25-2017   with dilatation esophageal stricture and savary dilatation   VAGINAL HYSTERECTOMY  1988    no ovaries removed for bleeeding     reports that she has never smoked. She has never used smokeless tobacco. She reports that she does not drink alcohol and does not use drugs.  Allergies  Allergen Reactions   Baclofen Other (See Comments)    Hyperactivity    Nitrofurantoin Rash    Family History  Problem Relation Age of Onset   Stroke Father        family hx of M 1st degree relative <50   Coronary artery disease Mother    Heart disease Mother    Depression Brother    Stroke Brother    Diabetes Brother    Cancer Brother        bladder with mets   Diabetes Daughter        borderline   Hypertension Daughter    Arthritis Other        family hx of   Hypertension Other        family hx of   Other Other        family hx of cardiovascular disorder   Thyroid disease Daughter    Breast cancer Neg Hx    Colon cancer Neg Hx    Anesthesia problems Neg Hx    Hypotension Neg Hx    Malignant hyperthermia Neg Hx    Pseudochol deficiency Neg Hx    Colon polyps Neg Hx    Esophageal cancer Neg Hx    Rectal cancer Neg Hx    Stomach cancer Neg Hx       Prior to Admission medications   Medication Sig Start Date End Date Taking? Authorizing Provider  acetaminophen (TYLENOL) 500 MG tablet Take 1,000 mg by mouth every 6 (six) hours as needed for moderate pain.   Yes [provider]  amLODipine (NORVASC) 10 MG tablet Take 1 tablet (10 mg total) by mouth daily. Patient taking differently: Take 10 mg by mouth in the morning. 09/06/20  Yes Mosie Lukes, MD  Ascorbic Acid (VITAMIN C) 500 MG CHEW Chew 500 mg by mouth daily.    [provider]  aspirin EC 81 MG tablet Take 1 tablet (81 mg total) by mouth daily. Patient taking differently: Take 81 mg by mouth in the morning. 05/04/15   [name], MD  Blood Glucose Monitoring Suppl (TRUE METRIX AIR GLUCOSE METER) w/Device KIT USE TO CHECK BLOOD SUGAR ONCE DAILY AND AS NEEDED.  DX CODE E11.9 07/27/20   Mosie Lukes, MD  Cholecalciferol (VITAMIN D) 50 MCG (2000 UT) CAPS Take 2,000 Units by mouth daily.     [provider]  diclofenac Sodium (VOLTAREN) 1 % GEL Apply 1 application topically 4 (four) times daily as needed (hip pain).    [provider]  famotidine (PEPCID) 20 MG tablet Take 20 mg by mouth at bedtime as needed for heartburn or indigestion.    [provider]  glucose blood (TRUE METRIX BLOOD GLUCOSE TEST) test strip USE TO CHECK BLOOD SUGAR ONCE A DAY OR AS NEEDED.  DX CODE: E11.9 07/27/20   Mosie Lukes, MD  isosorbide mononitrate (IMDUR) 60 MG 24 hr tablet Take 1.5 tablets (90 mg total) by mouth daily. Patient taking differently: Take 90 mg by mouth every evening. 12/14/20 12/14/21  [name] T, PA-C  ketorolac (TORADOL) 10 MG tablet Take  1 tablet (10 mg total) by mouth every 6 (six) hours as needed. Patient taking differently: Take 10 mg by mouth every 6 (six) hours as needed (pain). 10/30/20   Jaquita Folds, MD  losartan (COZAAR) 100 MG tablet Take 1 tablet (100 mg total) by mouth daily. Patient taking differently: Take 100  mg by mouth in the morning. 10/31/20   Mosie Lukes, MD  magnesium oxide (MAG-OX) 400 MG tablet Take 400 mg by mouth 2 (two) times daily.    [provider]  metoprolol succinate (TOPROL-XL) 50 MG 24 hr tablet Take 1.5 tablets (75 mg total) by mouth at bedtime. Take with or immediately following a meal. 12/14/20 12/14/21  [name] T, PA-C  mirabegron ER (MYRBETRIQ) 25 MG TB24 tablet Take 1 tablet (25 mg total) by mouth daily. 01/23/21   Jaquita Folds, MD  nitroGLYCERIN (NITROSTAT) 0.4 MG SL tablet Place 1 tablet (0.4 mg total) under the tongue every 5 (five) minutes as needed for chest pain. Patient taking differently: Place 0.4 mg under the tongue every 5 (five) minutes x 3 doses as needed for chest pain. 08/29/20 08/29/21  [name] T, PA-C  omeprazole (PRILOSEC) 20 MG capsule TAKE 1 CAPSULE EVERY DAY AS NEEDED Patient taking differently: Take 20 mg by mouth in the morning. 01/02/21   Mosie Lukes, MD  ondansetron (ZOFRAN ODT) 4 MG disintegrating tablet Take 1 tablet (4 mg total) by mouth every 6 (six) hours as needed for nausea or vomiting. 12/15/19   Armbruster, Carlota Raspberry, MD  potassium chloride SA (KLOR-CON) 20 MEQ tablet Take 0.5 tablets (10 mEq total) by mouth daily. Patient taking differently: Take 10 mEq by mouth every evening. 09/14/20   [name] T, PA-C  pramipexole (MIRAPEX) 0.25 MG tablet Take 1 tablet (0.25 mg total) by mouth 2 (two) times daily. 09/26/20   Mosie Lukes, MD  simvastatin (ZOCOR) 20 MG tablet TAKE 1 TABLET AT BEDTIME Patient taking differently: Take 20 mg by mouth every evening. 09/01/20   [name] T, PA-C  spironolactone (ALDACTONE) 25 MG tablet Take 0.5 tablets (12.5 mg total) by mouth 3 (three) times a week. Patient taking differently: Take 12.5 mg by mouth every Monday, Wednesday, and Friday. 09/30/20   [name] T, PA-C  traMADol (ULTRAM) 50 MG tablet Take 1 tablet (50 mg total) by mouth every 6 (six) hours as needed for severe pain.  01/26/21   Michael Boston, MD  TRUEplus Lancets 33G MISC USE TO CHECK BLOOD SUGAR ONCE DAILY AND AS NEEDED.  DX CODE: E11.9 07/27/20   Mosie Lukes, MD  Zinc 50 MG TABS Take 50 mg by mouth daily.    [provider]    Physical Exam: Vitals:   01/28/21 1938 01/28/21 2000 01/28/21 2130 01/28/21 2200  BP:  (!) 156/63 (!) 143/60 (!) 140/49  Pulse:  64 79 70  Resp: '20 19 18 17  ' Temp:      TempSrc:      SpO2:  92% 93% 94%  Weight:      Height:        Constitutional: NAD, calm, comfortable Eyes: PERRL, lids and conjunctivae normal ENMT: Mucous membranes are moist. Posterior pharynx clear of any exudate or lesions.Normal dentition.  Neck: normal, supple, no masses, no thyromegaly Respiratory: Normal respiratory effort. No accessory muscle use.  Cardiovascular: Regular rate and rhythm, no murmurs / rubs / gallops. Trace extremity edema. 2+ pedal pulses. No carotid bruits.  Abdomen: Mild TTP, dressing  C/D/I Musculoskeletal: no clubbing / cyanosis. No joint deformity upper and lower extremities. Good ROM, no contractures. Normal muscle tone.  Skin: no rashes, lesions, ulcers. No induration Neurologic: CN 2-12 grossly intact. Sensation intact, DTR normal. Strength 5/5 in all 4.  Psychiatric: Normal judgment and insight. Alert and oriented x 3. Normal mood.    Labs on Admission: I have personally reviewed following labs and imaging studies  CBC: Recent Labs  Lab 01/26/21 0413 01/28/21 1923  WBC 13.7* 9.4  NEUTROABS  --  7.7  HGB 11.6* 11.9*  HCT 35.4* 36.0  MCV 92.9 94.0  PLT 133* 678*   Basic Metabolic Panel: Recent Labs  Lab 01/26/21 0413 01/28/21 1923  NA 136 133*  K 4.7 5.1  CL 103 98  CO2 23 25  GLUCOSE 264* 129*  BUN 22 25*  CREATININE 1.05* 1.08*  CALCIUM 8.8* 9.0  MG 1.5*  --    GFR: Estimated Creatinine Clearance: 36.3 mL/min (A) (by C-G formula based on SCr of 1.08 mg/dL (H)). Liver Function Tests: Recent Labs  Lab 01/28/21 1923  AST 59*  ALT  80*  ALKPHOS 134*  BILITOT 1.2  PROT 6.6  ALBUMIN 3.6   Recent Labs  Lab 01/28/21 1923  LIPASE 25   No results for input(s): AMMONIA in the last 168 hours. Coagulation Profile: No results for input(s): INR, PROTIME in the last 168 hours. Cardiac Enzymes: No results for input(s): CKTOTAL, CKMB, CKMBINDEX, TROPONINI in the last 168 hours. BNP (last 3 results) Recent Labs    08/19/20 1351  PROBNP 428   HbA1C: No results for input(s): HGBA1C in the last 72 hours. CBG: Recent Labs  Lab 01/24/21 1143 01/25/21 1301 01/26/21 0743 01/26/21 1132  GLUCAP 157* 141* 219* 184*   Lipid Profile: No results for input(s): CHOL, HDL, LDLCALC, TRIG, CHOLHDL, LDLDIRECT in the last 72 hours. Thyroid Function Tests: No results for input(s): TSH, T4TOTAL, FREET4, T3FREE, THYROIDAB in the last 72 hours. Anemia Panel: No results for input(s): VITAMINB12, FOLATE, FERRITIN, TIBC, IRON, RETICCTPCT in the last 72 hours. Urine analysis:    Component Value Date/Time   COLORURINE STRAW (A) 01/28/2021 2140   APPEARANCEUR CLEAR 01/28/2021 2140   LABSPEC 1.011 01/28/2021 2140   PHURINE 6.0 01/28/2021 2140   GLUCOSEU NEGATIVE 01/28/2021 2140   GLUCOSEU NEGATIVE 09/21/2016 1103   HGBUR NEGATIVE 01/28/2021 2140   BILIRUBINUR NEGATIVE 01/28/2021 2140   BILIRUBINUR neg 11/24/2020 [place] 01/28/2021 2140   PROTEINUR NEGATIVE 01/28/2021 2140   UROBILINOGEN 0.2 11/24/2020 1204   UROBILINOGEN 0.2 09/21/2016 1103   NITRITE NEGATIVE 01/28/2021 2140   LEUKOCYTESUR NEGATIVE 01/28/2021 2140    Radiological Exams on Admission: DG Chest 2 View  Result Date: 01/28/2021 CLINICAL DATA:  Cough. Hypoxia. Abdominal pain, status post cholecystectomy and hernia repair 01/25/2021. EXAM: CHEST - 2 VIEW COMPARISON:  09/05/2020 FINDINGS: Mild blunting of both costophrenic angles. Linear opacities at the lung bases favoring subsegmental atelectasis. The patient is rotated to the right on today's  radiograph, reducing diagnostic sensitivity and specificity. No appreciable pneumothorax. Mild thoracic kyphosis. Atherosclerotic calcification of the aortic arch. IMPRESSION: 1. Mild bibasilar atelectasis with blunting of the costophrenic angle suggesting small bilateral pleural effusions. No edema. 2.  Aortic Atherosclerosis (ICD10-I70.0). Electronically Signed   By: Van Clines M.D.   On: 01/28/2021 18:30   CT Angio Chest PE W/Cm &/Or Wo Cm  Addendum Date: 01/28/2021   ADDENDUM REPORT: 01/28/2021 21:54 ADDENDUM: These results were called by telephone at the  time of interpretation on 01/28/2021 at 9:54 pm to provider Benay Pike MD, who verbally acknowledged these results. Electronically Signed   By: Lovena Le M.D.   On: 01/28/2021 21:54   Result Date: 01/28/2021 CLINICAL DATA:  Laparoscopic cholecystectomy and conventional ventral hernia repair performed 01/25/2021, now with shortness of breath and hypoxia. EXAM: CT ANGIOGRAPHY CHEST CT ABDOMEN AND PELVIS WITH CONTRAST TECHNIQUE: Multidetector CT imaging of the chest was performed using the standard protocol during bolus administration of intravenous contrast. Multiplanar CT image reconstructions and MIPs were obtained to evaluate the vascular anatomy. Multidetector CT imaging of the abdomen and pelvis was performed using the standard protocol during bolus administration of intravenous contrast. CONTRAST:  6m OMNIPAQUE IOHEXOL 350 MG/ML SOLN COMPARISON:  CT abdomen pelvis 11/24/2020, cholangiogram 01/25/2021, CT chest 08/27/2020 FINDINGS: CTA CHEST FINDINGS Cardiovascular: Fall there is satisfactory opacification of the pulmonary arteries. Exam quality is diminished by extensive cardiac pulsation and respiratory motion artifact throughout the lungs. No large central or lobar filling defects are convincingly demonstrated. More distal evaluation is largely precluded by technical factors. Top-normal cardiac size. Three-vessel coronary artery  atherosclerosis. No pericardial effusion. Cardiac pulsation artifact results in suboptimal assessment of the aortic root and ascending thoracic aorta. Mediastinum/Nodes: Few scattered small subcentimeter hypoattenuating nodules in the thyroid gland are unchanged from comparison prior and unlikely to be clinically significant. Thoracic inlet is otherwise unremarkable. Moderate hiatal hernia. Fluid-filled distal thoracic esophagus. Tracheal and central airways are unremarkable. No concerning mediastinal, hilar or axillary adenopathy. Lungs/Pleura: Small right trace left pleural effusions with some adjacent areas of passive atelectasis and additional dependent atelectatic features posteriorly. Some clustered nodules seen in the anterior lingula are new region of previously seen consolidation could reflect some postinflammatory change. No other focal airspace opacities are seen. Mild interlobular septal thickening could reflect some mild interstitial edema. Musculoskeletal: Exaggerated thoracic kyphosis. Multilevel degenerative changes are present in the imaged portions of the spine. Additional degenerative changes in the bilateral shoulders. No acute or worrisome chest wall mass or lesion. Review of the MIP images confirms the above findings. CT ABDOMEN and PELVIS FINDINGS Hepatobiliary: Normal hepatic attenuation. No concerning focal liver lesion. Question some micro lobulations along the anterior left lobe liver. Postsurgical changes from cholecystectomy with few foci of free gas along the liver surface and in the gallbladder fossa with a slightly more organized collection towards the terminus of the gallbladder fossa (4/27) measuring approximately 3.3 x 2.2 x 1.9 cm in size. With some surrounding phlegmonous inflammation. Slight prominence of the biliary tree is nonspecific and post cholecystectomy state without visible calcified gallstone. Pancreas: No pancreatic ductal dilatation or surrounding inflammatory  changes. Spleen: Normal in size. No concerning splenic lesions. Adrenals/Urinary Tract: Normal adrenal glands. Kidneys enhance and excrete symmetrically and uniformly. Suspect some mild bilateral renal atrophy. Areas of cortical scarring bilaterally. There is a 2 cm fluid attenuation cyst in the interpolar right kidney with a smaller 1.3 cm cyst in the lower pole (series 9, images 24, 15). No concerning renal mass. No urolithiasis or hydronephrosis. Urinary bladder is unremarkable for the degree of distention. Stomach/Bowel: Moderate hiatal hernia. Distal stomach and duodenum are unremarkable. No small bowel thickening or dilatation. No evidence of bowel obstruction. Normal appendix in the right lower quadrant. Scattered colonic diverticula without focal inflammation to suggest diverticulitis. No colonic dilatation or wall thickening. Vascular/Lymphatic: Atherosclerotic calcifications within the abdominal aorta and branch vessels. No aneurysm or ectasia. No pathologically enlarged abdominopelvic lymph nodes. Some reactive appearing adenopathy at the porta hepatis  and in the mid mesentery. Reproductive: Uterus is surgically absent. No concerning adnexal lesions. Other: Postsurgical changes the anterior abdominal wall compatible with recent ventral hernia repair. Stranding of the omental fat anteriorly at the site of operation is nonspecific. A more superior bilobed ventral hernia remains similar to prior with some small volume of free fluid in the left lobulation, likely postsurgical in nature. Few foci of intraperitoneal gas extending along the liver surface as well as gas and fluid in the gallbladder fossa, likely postsurgical in nature though a more organized collection does raise some suspicion. Small amount of free fluid tracking towards the lower abdomen. Mild body wall edema. Musculoskeletal: Multilevel degenerative changes are present in the imaged portions of the spine. Interspinous arthrosis compatible with  [place]'s disease. Review of the MIP images confirms the above findings. IMPRESSION: CT angiography of the chest: 1. Limited assessment for pulmonary artery embolism given extensive respiratory motion and cardiac pulsation artifact. No large central or lobar filling defects are identified. 2. Mild interstitial pulmonary edema and developing bilateral pleural effusions, right greater than left with adjacent areas of passive atelectasis. 3. Some persistent centrilobular ground-glass nodularity seen in the anterior lingula in a region of previously seen consolidation, could reflect some post infectious or inflammatory sequela. 4. Moderate hiatal hernia with fluid-filled distal thoracic esophagus, recommend correlation for aspiration risk. 5. Three-vessel coronary artery atherosclerosis. 6.  Aortic Atherosclerosis (ICD10-I70.0). CT abdomen and pelvis: 1. Postsurgical changes from recent laparoscopic cholecystectomy as well as conventional incisional hernia repair. 2. A small possibly organizing collection seen within the gallbladder fossa of admixed air and low to intermediate attenuation fluid with some questionable rim enhancement, could reflect a postoperative biloma, hematoma or seroma. Superinfection cannot be fully excluded. 3. Some expected postsurgical changes along the anterior abdominal wall at the site of a paraumbilical incisional hernia repair including soft tissue gas along the anterior abdominal wall and mild stranding along the omental fat adjacent the operative site. 4. A more superior bilobed fat containing ventral hernia rim remains, grossly similar to comparison prior albeit with some stranding and free fluid in the hernia contents which can be expected postsurgically. 5. Diverticulosis without evidence of acute diverticulitis. 6.  Aortic Atherosclerosis (ICD10-I70.0). Electronically Signed: By: Lovena Le M.D. On: 01/28/2021 21:46   CT Abdomen Pelvis W Contrast  Addendum Date: 01/28/2021    ADDENDUM REPORT: 01/28/2021 21:54 ADDENDUM: These results were called by telephone at the time of interpretation on 01/28/2021 at 9:54 pm to provider Benay Pike MD, who verbally acknowledged these results. Electronically Signed   By: Lovena Le M.D.   On: 01/28/2021 21:54   Result Date: 01/28/2021 CLINICAL DATA:  Laparoscopic cholecystectomy and conventional ventral hernia repair performed 01/25/2021, now with shortness of breath and hypoxia. EXAM: CT ANGIOGRAPHY CHEST CT ABDOMEN AND PELVIS WITH CONTRAST TECHNIQUE: Multidetector CT imaging of the chest was performed using the standard protocol during bolus administration of intravenous contrast. Multiplanar CT image reconstructions and MIPs were obtained to evaluate the vascular anatomy. Multidetector CT imaging of the abdomen and pelvis was performed using the standard protocol during bolus administration of intravenous contrast. CONTRAST:  36m OMNIPAQUE IOHEXOL 350 MG/ML SOLN COMPARISON:  CT abdomen pelvis 11/24/2020, cholangiogram 01/25/2021, CT chest 08/27/2020 FINDINGS: CTA CHEST FINDINGS Cardiovascular: Fall there is satisfactory opacification of the pulmonary arteries. Exam quality is diminished by extensive cardiac pulsation and respiratory motion artifact throughout the lungs. No large central or lobar filling defects are convincingly demonstrated. More distal evaluation is largely precluded by technical  factors. Top-normal cardiac size. Three-vessel coronary artery atherosclerosis. No pericardial effusion. Cardiac pulsation artifact results in suboptimal assessment of the aortic root and ascending thoracic aorta. Mediastinum/Nodes: Few scattered small subcentimeter hypoattenuating nodules in the thyroid gland are unchanged from comparison prior and unlikely to be clinically significant. Thoracic inlet is otherwise unremarkable. Moderate hiatal hernia. Fluid-filled distal thoracic esophagus. Tracheal and central airways are unremarkable. No concerning  mediastinal, hilar or axillary adenopathy. Lungs/Pleura: Small right trace left pleural effusions with some adjacent areas of passive atelectasis and additional dependent atelectatic features posteriorly. Some clustered nodules seen in the anterior lingula are new region of previously seen consolidation could reflect some postinflammatory change. No other focal airspace opacities are seen. Mild interlobular septal thickening could reflect some mild interstitial edema. Musculoskeletal: Exaggerated thoracic kyphosis. Multilevel degenerative changes are present in the imaged portions of the spine. Additional degenerative changes in the bilateral shoulders. No acute or worrisome chest wall mass or lesion. Review of the MIP images confirms the above findings. CT ABDOMEN and PELVIS FINDINGS Hepatobiliary: Normal hepatic attenuation. No concerning focal liver lesion. Question some micro lobulations along the anterior left lobe liver. Postsurgical changes from cholecystectomy with few foci of free gas along the liver surface and in the gallbladder fossa with a slightly more organized collection towards the terminus of the gallbladder fossa (4/27) measuring approximately 3.3 x 2.2 x 1.9 cm in size. With some surrounding phlegmonous inflammation. Slight prominence of the biliary tree is nonspecific and post cholecystectomy state without visible calcified gallstone. Pancreas: No pancreatic ductal dilatation or surrounding inflammatory changes. Spleen: Normal in size. No concerning splenic lesions. Adrenals/Urinary Tract: Normal adrenal glands. Kidneys enhance and excrete symmetrically and uniformly. Suspect some mild bilateral renal atrophy. Areas of cortical scarring bilaterally. There is a 2 cm fluid attenuation cyst in the interpolar right kidney with a smaller 1.3 cm cyst in the lower pole (series 9, images 24, 15). No concerning renal mass. No urolithiasis or hydronephrosis. Urinary bladder is unremarkable for the degree  of distention. Stomach/Bowel: Moderate hiatal hernia. Distal stomach and duodenum are unremarkable. No small bowel thickening or dilatation. No evidence of bowel obstruction. Normal appendix in the right lower quadrant. Scattered colonic diverticula without focal inflammation to suggest diverticulitis. No colonic dilatation or wall thickening. Vascular/Lymphatic: Atherosclerotic calcifications within the abdominal aorta and branch vessels. No aneurysm or ectasia. No pathologically enlarged abdominopelvic lymph nodes. Some reactive appearing adenopathy at the porta hepatis and in the mid mesentery. Reproductive: Uterus is surgically absent. No concerning adnexal lesions. Other: Postsurgical changes the anterior abdominal wall compatible with recent ventral hernia repair. Stranding of the omental fat anteriorly at the site of operation is nonspecific. A more superior bilobed ventral hernia remains similar to prior with some small volume of free fluid in the left lobulation, likely postsurgical in nature. Few foci of intraperitoneal gas extending along the liver surface as well as gas and fluid in the gallbladder fossa, likely postsurgical in nature though a more organized collection does raise some suspicion. Small amount of free fluid tracking towards the lower abdomen. Mild body wall edema. Musculoskeletal: Multilevel degenerative changes are present in the imaged portions of the spine. Interspinous arthrosis compatible with Baastrup's disease. Review of the MIP images confirms the above findings. IMPRESSION: CT angiography of the chest: 1. Limited assessment for pulmonary artery embolism given extensive respiratory motion and cardiac pulsation artifact. No large central or lobar filling defects are identified. 2. Mild interstitial pulmonary edema and developing bilateral pleural effusions, right greater than  left with adjacent areas of passive atelectasis. 3. Some persistent centrilobular ground-glass nodularity  seen in the anterior lingula in a region of previously seen consolidation, could reflect some post infectious or inflammatory sequela. 4. Moderate hiatal hernia with fluid-filled distal thoracic esophagus, recommend correlation for aspiration risk. 5. Three-vessel coronary artery atherosclerosis. 6.  Aortic Atherosclerosis (ICD10-I70.0). CT abdomen and pelvis: 1. Postsurgical changes from recent laparoscopic cholecystectomy as well as conventional incisional hernia repair. 2. A small possibly organizing collection seen within the gallbladder fossa of admixed air and low to intermediate attenuation fluid with some questionable rim enhancement, could reflect a postoperative biloma, hematoma or seroma. Superinfection cannot be fully excluded. 3. Some expected postsurgical changes along the anterior abdominal wall at the site of a paraumbilical incisional hernia repair including soft tissue gas along the anterior abdominal wall and mild stranding along the omental fat adjacent the operative site. 4. A more superior bilobed fat containing ventral hernia rim remains, grossly similar to comparison prior albeit with some stranding and free fluid in the hernia contents which can be expected postsurgically. 5. Diverticulosis without evidence of acute diverticulitis. 6.  Aortic Atherosclerosis (ICD10-I70.0). Electronically Signed: By: Lovena Le M.D. On: 01/28/2021 21:46    EKG: Independently reviewed.  Assessment/Plan Principal Problem:   Acute on chronic diastolic CHF (congestive heart failure) (HCC) Active Problems:   Essential hypertension   DM (diabetes mellitus) ([place])   Chronic calculous cholecystitis s/p lap cholecystectomy 01/25/2021   Incarcerated incisional hernia s/p primary repair 01/25/2021   Intra-abdominal fluid collection    Acute hypoxic resp failure due to acute on chronic diastolic CHF - Probably fluid overload from surgery I suspect CHF pathway Lasix 41m IV daily Cont home  aldactone Tele monitor 2d echo Strict intake and output Check BNP Trop nl Daily BMP HTN - Cont home BP meds DM2 - Chart diagnosis Diet controlled Post op fluid collection - Phlegmon vs seroma vs hematoma vs biloma No WBC, Tm 99.1 Holding off on ABx for the moment Sending message to gen surg to see if they want further work up Repeat CBC in AM  DVT prophylaxis: Lovenox Code Status: Full Family Communication: Daughter at bedside Disposition Plan: Home after breathing improved Consults called: None called [place]  [place] Ledin, JFultonHospitalists  How to contact the TAdvanced Colon Care IncAttending or Consulting provider [place] provider during after hours [place] for this patient?  Check the care team in [facility] Centerand look for a) attending/consulting [facility] provider listed and b) the TSheper[facility] Hill Hospitalteam listed Log into [email or web address]  Amion Physician Scheduling and messaging for groups and whole hospitals  On call and physician scheduling software for group practices, residents, hospitalists and other medical providers for call, clinic, rotation and shift schedules. [employer] is a hospital-wide system for scheduling doctors and paging doctors on call. EasyPlot is for scientific plotting and data analysis.  [email or web address]  and use [facility]'s universal password to access. If you do not have the password, please contact the hospital operator.  Locate the TGlobal Rehab Rehabilitation Hospitalprovider you are looking for under [facility] and page to a number that you can be directly reached. If you still have difficulty reaching the provider, please page the [facility](Director on Call) for the Hospitalists listed on amion for assistance.  01/28/2021, 11:54 PM

## 2021-01-28 NOTE — ED Provider Notes (Signed)
[facility] DEPT Provider Note   CSN: [id number] Arrival date & time: 01/28/21  1734     History Chief Complaint  Patient presents with   Shortness of Breath   Post-op Problem    Heather [name] is a 86 y.o. female.   Shortness of Breath Associated symptoms: abdominal pain   Associated symptoms: no cough, no ear pain, no fever, no rash, no sore throat and no vomiting    86 year old female presenting to the emergency department for new onset hypoxia to the 80s at home.  She has had some abdominal pain since her cholecystectomy and hernia repair on 01/25/2021 which is expected.  She does endorse some worsening of her abdominal pain.  Over the past day she has felt more short of breath with associated chest tightness and pleuritic discomfort.  She does have a history of DVT and is not currently on anticoagulation.  She denies any nausea or vomiting.  She is tolerating oral intake.  She has not had a bowel movement since discharge from her surgery.  She endorses positive flatus.  Past Medical History:  Diagnosis Date   Chronic stable angina [facility])    Coronary artery disease cardiologist--- dr Burt Knack   hx NSTEMI  w/ cardiac cath 03-16-2005 PCI with DES to LCx;   cardiac cath 06-27-2005  PCI with DES to RCA with residual dx LAD manage medcially;  lexiscan 01-26-20211 normal no ischemia, ef 70%;  stress echo w/ dobutamine 10-24-2011 negative ishcmeia , normal ef // Myoview 2/22: EF 67, no ischemia or infarction, no TID, low risk    Diabetes mellitus type 2, diet-controlled ([place])    followed by pcp  (10-19-2020  pt stated checks daily in am,  fasting blood surgar--- 115--120s)   DOE (dyspnea on exertion)    per pt "when I over do",  ok with household chores   Echocardiogram 08/2020    Echocardiogram 2/22: EF 55-60, no RWMA, mild LVH, Gr 2 DD, GLS-21.7%, normal RVSF, trivial MR, RVSP 39.5   Edema of right lower extremity    GERD (gastroesophageal reflux disease)     Hiatal hernia    recurrence,  hx HH repair 1989   History of cervical cancer    s/p  vaginal hysterectomy   History of DVT of lower extremity 2016   11-29-2014 post op right TKA of right lower extremity and completed xarelto    History of esophageal stricture    hx s/p dilatation's   History of gastric ulcer 2005 approx.   History of palpitations 2010   event monitor 07-07-2009 showed NSR w/ freq. SVT ectopies with short runs, rare PVCs   History of TIA (transient ischemic attack) 06/1999   12-15-2019  per pt had several TIA between 12/ 2000 to 02/ 2001 , was sent to specialist '@[facility]' , had test that was normal (10-19-2020 pt stated no TIAs since ) but has residual of essential tremors of right arm/ hand   Hypertension    Intermittent palpitations    IT band syndrome    Migraine    "ice pick headche lasts about 30 seconds"   Mixed hyperlipidemia    Mixed incontinence urge and stress    Multiple thyroid nodules    followed by pcp---   ultrasound 11-22-2014 no bx   (12-15-2019 per pt had a endocrinologist and was told did not need bx)   OA (osteoarthritis)    knees, elbow, hip, ankles   Occasional tremors    right arm/  hand  s/p TIA residual 2000   Osteoporosis    taking vitamin d   Peroneal DVT (deep venous thrombosis) (HCC) 12/22/2014   Right bundle branch block (RBBB) with left anterior fascicular block (LAFB)    RLS (restless legs syndrome)    S/P drug eluting coronary stent placement 2006   03-16-2005  PCI x1 DES to LCx;   06-27-2005  PCI x1 DES to RCA   Urinary retention    post op sling prodecure on 10-27-2020, has foley cathether    Patient Active Problem List   Diagnosis Date Noted   Acute on chronic diastolic CHF (congestive heart failure) ([place]) 01/28/2021   Intra-abdominal fluid collection 01/28/2021   Incarcerated incisional hernia s/p primary repair 01/25/2021 01/25/2021   Stress reaction of bone 01/05/2021   Pain of right sternoclavicular joint 01/05/2021    Screening for osteoporosis 01/05/2021   RUQ pain 12/01/2020   Chronic calculous cholecystitis s/p lap cholecystectomy 01/25/2021 12/01/2020   Low back pain radiating to right leg 07/14/2020   Pelvic relaxation due to rectocele 04/06/2020   Prolapse of female pelvic organs 12/22/2019   Urinary incontinence, mixed 10/01/2019   Pelvic floor relaxation 06/08/2019   Deep venous thrombosis ([place]) 03/19/2019   Fatigue 03/19/2019   Goiter 03/19/2019   Transient ischemic attack 03/19/2019   Acute dermatitis 03/19/2019   Pelvic prolapse 03/08/2019   Pain of breast 01/29/2019   Overactive bladder 01/29/2019   Educated about COVID-19 virus infection 12/02/2018   Hyperlipidemia associated with type 2 diabetes mellitus ([place]) 05/27/2017   Headache 04/17/2015   Multinodular goiter 01/17/2015   S/P total knee arthroplasty 11/29/2014   Insomnia 02/05/2014   Preventative health care 11/22/2013   RLS (restless legs syndrome) 10/04/2013   Lower urinary tract infectious disease 10/04/2013   Cataracts, bilateral 04/02/2013   Hypokalemia 01/05/2012   Anemia 01/05/2012   Mixed anxiety and depressive disorder 01/05/2012   Postoperative anemia due to acute blood loss 11/15/2011   Staphylococcus aureus carrier 11/15/2011   Pre-syncope 10/13/2011   Hyperglycemia 10/07/2011   DM (diabetes mellitus) ([place]) 10/06/2011   Pulmonary nodule 10/06/2011   Epigastric pain 10/06/2011   It band syndrome, right 08/28/2011   Renal insufficiency 08/28/2011   CORONARY ATHEROSCLEROSIS NATIVE CORONARY ARTERY 03/07/2010   Coronary atherosclerosis 01/18/2009   FATIGUE 01/18/2009   PERSISTENT DISORDER INITIATING/MAINTAINING SLEEP 09/09/2008   DERMATITIS 10/16/2007   PERIPHERAL EDEMA 10/16/2007   Essential hypertension 04/14/2007   Gastroesophageal reflux disease 04/14/2007    Past Surgical History:  Procedure Laterality Date   ANTERIOR AND POSTERIOR REPAIR N/A 12/22/2019   Procedure: ANTERIOR (CYSTOCELE)  REPAIR;   Surgeon: Janyth Contes, MD;  Location: [place];  Service: Gynecology;  Laterality: N/A;   ANTERIOR AND POSTERIOR REPAIR WITH SACROSPINOUS FIXATION N/A 10/27/2020   Procedure: SACROSPINOUS LIGAMENT FIXATION;  Surgeon: Jaquita Folds, MD;  Location: [facility];  Service: Gynecology;  Laterality: N/A;   BLADDER SUSPENSION N/A 10/27/2020   Procedure: TRANSVAGINAL TAPE (TVT) PROCEDURE;  Surgeon: Jaquita Folds, MD;  Location: [facility];  Service: Gynecology;  Laterality: N/A;   CATARACT EXTRACTION W/ INTRAOCULAR LENS  IMPLANT, BILATERAL  2015   CHOLECYSTECTOMY N/A 01/25/2021   Procedure: LAPAROSCOPIC CHOLECYSTECTOMY WITH INTRAOPERATIVE CHOLANGIOGRAM AND LYSIS OF ADHESIONS;  Surgeon: Michael Boston, MD;  Location: [facility];  Service: General;  Laterality: N/A;   COLONOSCOPY  last one ?   CORONARY ANGIOPLASTY WITH STENT PLACEMENT  03-16-2005   dr Lia Foyer   PCI and  DES x1 to LCx   CORONARY ANGIOPLASTY WITH STENT PLACEMENT  06-27-2005  dr Lia Foyer   PCI and DES x1 to RCA with residual disease LAD 70-80% to manage medically   CYSTOSCOPY N/A 10/27/2020   Procedure: CYSTOSCOPY;  Surgeon: Jaquita Folds, MD;  Location: [facility];  Service: Gynecology;  Laterality: N/A;   CYSTOSCOPY N/A 11/09/2020   Procedure: CYSTOSCOPY;  Surgeon: Jaquita Folds, MD;  Location: [facility];  Service: Gynecology;  Laterality: N/A;   FOOT SURGERY Left 1990s   left foot stress fracture repair, per pt no hardware   HIATAL HERNIA REPAIR  1989   INCISIONAL HERNIA REPAIR N/A 01/25/2021   Procedure: PRIMARY REPAIR OF INCISIONAL HERNIA;  Surgeon: Michael Boston, MD;  Location: [facility];  Service: General;  Laterality: N/A;   KNEE ARTHROSCOPY Bilateral right ?/   left x2 , last one 09-12-2009 @ [facility]   PUBOVAGINAL SLING N/A 11/09/2020   Procedure: [place];  Surgeon: Jaquita Folds, MD;  Location:  [facility];  Service: Gynecology;  Laterality: N/A;   RECTOCELE REPAIR N/A 04/20/2020   Procedure: POSTERIOR REPAIR (RECTOCELE);  Surgeon: Janyth Contes, MD;  Location: [facility];  Service: Gynecology;  Laterality: N/A;   RECTOCELE REPAIR N/A 10/27/2020   Procedure: POSTERIOR REPAIR (RECTOCELE);  Surgeon: Jaquita Folds, MD;  Location: [facility];  Service: Gynecology;  Laterality: N/A;  total time requested for all procedures is 2 hours   TOTAL KNEE ARTHROPLASTY  11/12/2011   Procedure: TOTAL KNEE ARTHROPLASTY;  Surgeon: Lorn Junes, MD;  Location: [facility];  Service: Orthopedics;  Laterality: Left;  Dr Noemi Chapel wants 90 minutes for this case   TOTAL KNEE ARTHROPLASTY Right 11/29/2014   Procedure: RIGHT TOTAL KNEE ARTHROPLASTY;  Surgeon: Vickey Huger, MD;  Location: [place];  Service: Orthopedics;  Laterality: Right;   UPPER GASTROINTESTINAL ENDOSCOPY  last one 04-25-2017   with dilatation esophageal stricture and savary dilatation   VAGINAL HYSTERECTOMY  1988    no ovaries removed for bleeeding     OB History     Gravida  4   Para  1   Term      Preterm  1   AB      Living  4      SAB      IAB      Ectopic      Multiple      Live Births              Family History  Problem Relation Age of Onset   Stroke Father        family hx of M 1st degree relative <50   Coronary artery disease Mother    Heart disease Mother    Depression Brother    Stroke Brother    Diabetes Brother    Cancer Brother        bladder with mets   Diabetes Daughter        borderline   Hypertension Daughter    Arthritis Other        family hx of   Hypertension Other        family hx of   Other Other        family hx of cardiovascular disorder   Thyroid disease Daughter    Breast cancer Neg Hx    Colon cancer Neg Hx    Anesthesia problems Neg Hx  Hypotension Neg Hx    Malignant hyperthermia Neg Hx    Pseudochol deficiency  Neg Hx    Colon polyps Neg Hx    Esophageal cancer Neg Hx    Rectal cancer Neg Hx    Stomach cancer Neg Hx     Social History   Tobacco Use   Smoking status: Never   Smokeless tobacco: Never  Vaping Use   Vaping Use: Never used  Substance Use Topics   Alcohol use: No    Alcohol/week: 0.0 standard drinks   Drug use: Never    Home Medications Prior to Admission medications   Medication Sig Start Date End Date Taking? Authorizing Provider  acetaminophen (TYLENOL) 500 MG tablet Take 1,000 mg by mouth every 6 (six) hours as needed for moderate pain.   Yes [provider]  amLODipine (NORVASC) 10 MG tablet Take 1 tablet (10 mg total) by mouth daily. Patient taking differently: Take 10 mg by mouth in the morning. 09/06/20  Yes Mosie Lukes, MD  Ascorbic Acid (VITAMIN C) 500 MG CHEW Chew 500 mg by mouth daily.   Yes [provider]  aspirin EC 81 MG tablet Take 1 tablet (81 mg total) by mouth daily. Patient taking differently: Take 81 mg by mouth in the morning. 05/04/15  Yes [name], MD  Blood Glucose Monitoring Suppl (TRUE METRIX AIR GLUCOSE METER) w/Device KIT USE TO CHECK BLOOD SUGAR ONCE DAILY AND AS NEEDED.  DX CODE E11.9 07/27/20  Yes Mosie Lukes, MD  Cholecalciferol (VITAMIN D) 50 MCG (2000 UT) CAPS Take 2,000 Units by mouth daily.    Yes [provider]  diclofenac Sodium (VOLTAREN) 1 % GEL Apply 1 application topically 4 (four) times daily as needed (hip pain).   Yes [provider]  famotidine (PEPCID) 20 MG tablet Take 20 mg by mouth at bedtime as needed for heartburn or indigestion.   Yes [provider]  glucose blood (TRUE METRIX BLOOD GLUCOSE TEST) test strip USE TO CHECK BLOOD SUGAR ONCE A DAY OR AS NEEDED.  DX CODE: E11.9 07/27/20  Yes Mosie Lukes, MD  isosorbide mononitrate (IMDUR) 60 MG 24 hr tablet Take 1.5 tablets (90 mg total) by mouth daily. Patient taking differently: Take 90 mg by mouth every evening.  12/14/20 12/14/21 Yes Weaver, Scott T, PA-C  ketorolac (TORADOL) 10 MG tablet Take 1 tablet (10 mg total) by mouth every 6 (six) hours as needed. Patient taking differently: Take 10 mg by mouth every 6 (six) hours as needed (pain). 10/30/20  Yes Jaquita Folds, MD  losartan (COZAAR) 100 MG tablet Take 1 tablet (100 mg total) by mouth daily. Patient taking differently: Take 100 mg by mouth in the morning. 10/31/20  Yes Mosie Lukes, MD  magnesium oxide (MAG-OX) 400 MG tablet Take 400 mg by mouth at bedtime.   Yes [provider]  metoprolol succinate (TOPROL-XL) 50 MG 24 hr tablet Take 1.5 tablets (75 mg total) by mouth at bedtime. Take with or immediately following a meal. 12/14/20 12/14/21 Yes Weaver, Scott T, PA-C  nitroGLYCERIN (NITROSTAT) 0.4 MG SL tablet Place 1 tablet (0.4 mg total) under the tongue every 5 (five) minutes as needed for chest pain. Patient taking differently: Place 0.4 mg under the tongue every 5 (five) minutes x 3 doses as needed for chest pain. 08/29/20 08/29/21 Yes Weaver, Scott T, PA-C  omeprazole (PRILOSEC) 20 MG capsule TAKE 1 CAPSULE EVERY DAY AS NEEDED Patient taking differently: Take 20 mg  by mouth in the morning. 01/02/21  Yes Mosie Lukes, MD  ondansetron (ZOFRAN ODT) 4 MG disintegrating tablet Take 1 tablet (4 mg total) by mouth every 6 (six) hours as needed for nausea or vomiting. 12/15/19  Yes Armbruster, Carlota Raspberry, MD  potassium chloride SA (KLOR-CON) 20 MEQ tablet Take 0.5 tablets (10 mEq total) by mouth daily. Patient taking differently: Take 10 mEq by mouth every evening. 09/14/20  Yes Weaver, Scott T, PA-C  pramipexole (MIRAPEX) 0.25 MG tablet Take 1 tablet (0.25 mg total) by mouth 2 (two) times daily. 09/26/20  Yes Mosie Lukes, MD  simvastatin (ZOCOR) 20 MG tablet TAKE 1 TABLET AT BEDTIME Patient taking differently: Take 20 mg by mouth every evening. 09/01/20  Yes Weaver, Nicki Reaper T, PA-C  spironolactone (ALDACTONE) 25 MG tablet Take 0.5 tablets (12.5 mg  total) by mouth 3 (three) times a week. Patient taking differently: Take 12.5 mg by mouth every Monday, Wednesday, and Friday. 09/30/20  Yes Weaver, Scott T, PA-C  traMADol (ULTRAM) 50 MG tablet Take 1 tablet (50 mg total) by mouth every 6 (six) hours as needed for severe pain. 01/26/21  Yes Michael Boston, MD  TRUEplus Lancets 33G MISC USE TO CHECK BLOOD SUGAR ONCE DAILY AND AS NEEDED.  DX CODE: E11.9 07/27/20  Yes Mosie Lukes, MD  Zinc 50 MG TABS Take 50 mg by mouth daily.   Yes [provider]  mirabegron ER (MYRBETRIQ) 25 MG TB24 tablet Take 1 tablet (25 mg total) by mouth daily. 01/23/21   Jaquita Folds, MD    Allergies    Baclofen and Nitrofurantoin  Review of Systems   Review of Systems  Constitutional:  Negative for chills and fever.  HENT:  Negative for ear pain and sore throat.   Eyes:  Negative for pain and visual disturbance.  Respiratory:  Positive for chest tightness and shortness of breath. Negative for cough.   Cardiovascular:  Negative for palpitations.  Gastrointestinal:  Positive for abdominal pain and constipation. Negative for nausea and vomiting.  Genitourinary:  Negative for dysuria and hematuria.  Musculoskeletal:  Negative for arthralgias and back pain.  Skin:  Negative for color change and rash.  Neurological:  Negative for seizures and syncope.  All other systems reviewed and are negative.  Physical Exam Updated Vital Signs BP 128/62   Pulse 66   Temp 97.9 F (36.6 C)   Resp 16   Ht '5\' 2"'  (1.575 m)   Wt 73 kg   LMP  (LMP Unknown)   SpO2 94%   BMI 29.44 kg/m   Physical Exam Vitals and nursing note reviewed.  Constitutional:      General: She is not in acute distress.    Appearance: She is well-developed.  HENT:     Head: Normocephalic and atraumatic.  Eyes:     Conjunctiva/sclera: Conjunctivae normal.  Cardiovascular:     Rate and Rhythm: Normal rate and regular rhythm.     Heart sounds: No murmur heard. Pulmonary:      Effort: Pulmonary effort is normal. No respiratory distress.     Breath sounds: Normal breath sounds.  Abdominal:     Palpations: Abdomen is soft.     Tenderness: There is abdominal tenderness.     Comments: Surgical scars in place along the abdomen with epigastric and right upper quadrant tenderness to palpation.  No erythema along the surgical scars, no drainage.  Musculoskeletal:     Cervical back: Neck supple.  Skin:  General: Skin is warm and dry.  Neurological:     Mental Status: She is alert.    ED Results / Procedures / Treatments   Labs (all labs ordered are listed, but only abnormal results are displayed) Labs Reviewed  COMPREHENSIVE METABOLIC PANEL - Abnormal; Notable for the following components:      Result Value   Sodium 133 (*)    Glucose, Bld 129 (*)    BUN 25 (*)    Creatinine, Ser 1.08 (*)    AST 59 (*)    ALT 80 (*)    Alkaline Phosphatase 134 (*)    GFR, Estimated 51 (*)    All other components within normal limits  CBC WITH DIFFERENTIAL/PLATELET - Abnormal; Notable for the following components:   RBC 3.83 (*)    Hemoglobin 11.9 (*)    Platelets 143 (*)    All other components within normal limits  URINALYSIS, ROUTINE W REFLEX MICROSCOPIC - Abnormal; Notable for the following components:   Color, Urine STRAW (*)    All other components within normal limits  BASIC METABOLIC PANEL - Abnormal; Notable for the following components:   Sodium 132 (*)    Chloride 97 (*)    Glucose, Bld 109 (*)    Creatinine, Ser 1.08 (*)    Calcium 8.8 (*)    GFR, Estimated 51 (*)    All other components within normal limits  CBC - Abnormal; Notable for the following components:   RBC 3.77 (*)    Hemoglobin 11.6 (*)    HCT 35.0 (*)    Platelets 139 (*)    All other components within normal limits  BRAIN NATRIURETIC PEPTIDE - Abnormal; Notable for the following components:   B Natriuretic Peptide 453.4 (*)    All other components within normal limits  RESP PANEL BY  RT-PCR (FLU A&B, COVID) ARPGX2  LIPASE, BLOOD  MAGNESIUM  CBC  COMPREHENSIVE METABOLIC PANEL  TROPONIN I (HIGH SENSITIVITY)  TROPONIN I (HIGH SENSITIVITY)    EKG EKG Interpretation  Date/Time:  Saturday January 28 2021 19:33:38 EDT Ventricular Rate:  61 PR Interval:  179 QRS Duration: 141 QT Interval:  422 QTC Calculation: 426 R Axis:   -44 Text Interpretation: Sinus rhythm RBBB and LAFB Left ventricular hypertrophy No significant change since last tracing Confirmed by Regan Lemming ([id number]) on 01/28/2021 8:29:29 PM  Radiology DG Chest 2 View  Result Date: 01/28/2021 CLINICAL DATA:  Cough. Hypoxia. Abdominal pain, status post cholecystectomy and hernia repair 01/25/2021. EXAM: CHEST - 2 VIEW COMPARISON:  09/05/2020 FINDINGS: Mild blunting of both costophrenic angles. Linear opacities at the lung bases favoring subsegmental atelectasis. The patient is rotated to the right on today's radiograph, reducing diagnostic sensitivity and specificity. No appreciable pneumothorax. Mild thoracic kyphosis. Atherosclerotic calcification of the aortic arch. IMPRESSION: 1. Mild bibasilar atelectasis with blunting of the costophrenic angle suggesting small bilateral pleural effusions. No edema. 2.  Aortic Atherosclerosis (ICD10-I70.0). Electronically Signed   By: Van Clines M.D.   On: 01/28/2021 18:30   CT Angio Chest PE W/Cm &/Or Wo Cm  Addendum Date: 01/28/2021   ADDENDUM REPORT: 01/28/2021 21:54 ADDENDUM: These results were called by telephone at the time of interpretation on 01/28/2021 at 9:54 pm to provider Benay Pike MD, who verbally acknowledged these results. Electronically Signed   By: Lovena Le M.D.   On: 01/28/2021 21:54   Result Date: 01/28/2021 CLINICAL DATA:  Laparoscopic cholecystectomy and conventional ventral hernia repair performed 01/25/2021, now with shortness of breath and  hypoxia. EXAM: CT ANGIOGRAPHY CHEST CT ABDOMEN AND PELVIS WITH CONTRAST TECHNIQUE: Multidetector CT imaging of  the chest was performed using the standard protocol during bolus administration of intravenous contrast. Multiplanar CT image reconstructions and MIPs were obtained to evaluate the vascular anatomy. Multidetector CT imaging of the abdomen and pelvis was performed using the standard protocol during bolus administration of intravenous contrast. CONTRAST:  71m OMNIPAQUE IOHEXOL 350 MG/ML SOLN COMPARISON:  CT abdomen pelvis 11/24/2020, cholangiogram 01/25/2021, CT chest 08/27/2020 FINDINGS: CTA CHEST FINDINGS Cardiovascular: Fall there is satisfactory opacification of the pulmonary arteries. Exam quality is diminished by extensive cardiac pulsation and respiratory motion artifact throughout the lungs. No large central or lobar filling defects are convincingly demonstrated. More distal evaluation is largely precluded by technical factors. Top-normal cardiac size. Three-vessel coronary artery atherosclerosis. No pericardial effusion. Cardiac pulsation artifact results in suboptimal assessment of the aortic root and ascending thoracic aorta. Mediastinum/Nodes: Few scattered small subcentimeter hypoattenuating nodules in the thyroid gland are unchanged from comparison prior and unlikely to be clinically significant. Thoracic inlet is otherwise unremarkable. Moderate hiatal hernia. Fluid-filled distal thoracic esophagus. Tracheal and central airways are unremarkable. No concerning mediastinal, hilar or axillary adenopathy. Lungs/Pleura: Small right trace left pleural effusions with some adjacent areas of passive atelectasis and additional dependent atelectatic features posteriorly. Some clustered nodules seen in the anterior lingula are new region of previously seen consolidation could reflect some postinflammatory change. No other focal airspace opacities are seen. Mild interlobular septal thickening could reflect some mild interstitial edema. Musculoskeletal: Exaggerated thoracic kyphosis. Multilevel degenerative changes  are present in the imaged portions of the spine. Additional degenerative changes in the bilateral shoulders. No acute or worrisome chest wall mass or lesion. Review of the MIP images confirms the above findings. CT ABDOMEN and PELVIS FINDINGS Hepatobiliary: Normal hepatic attenuation. No concerning focal liver lesion. Question some micro lobulations along the anterior left lobe liver. Postsurgical changes from cholecystectomy with few foci of free gas along the liver surface and in the gallbladder fossa with a slightly more organized collection towards the terminus of the gallbladder fossa (4/27) measuring approximately 3.3 x 2.2 x 1.9 cm in size. With some surrounding phlegmonous inflammation. Slight prominence of the biliary tree is nonspecific and post cholecystectomy state without visible calcified gallstone. Pancreas: No pancreatic ductal dilatation or surrounding inflammatory changes. Spleen: Normal in size. No concerning splenic lesions. Adrenals/Urinary Tract: Normal adrenal glands. Kidneys enhance and excrete symmetrically and uniformly. Suspect some mild bilateral renal atrophy. Areas of cortical scarring bilaterally. There is a 2 cm fluid attenuation cyst in the interpolar right kidney with a smaller 1.3 cm cyst in the lower pole (series 9, images 24, 15). No concerning renal mass. No urolithiasis or hydronephrosis. Urinary bladder is unremarkable for the degree of distention. Stomach/Bowel: Moderate hiatal hernia. Distal stomach and duodenum are unremarkable. No small bowel thickening or dilatation. No evidence of bowel obstruction. Normal appendix in the right lower quadrant. Scattered colonic diverticula without focal inflammation to suggest diverticulitis. No colonic dilatation or wall thickening. Vascular/Lymphatic: Atherosclerotic calcifications within the abdominal aorta and branch vessels. No aneurysm or ectasia. No pathologically enlarged abdominopelvic lymph nodes. Some reactive appearing  adenopathy at the porta hepatis and in the mid mesentery. Reproductive: Uterus is surgically absent. No concerning adnexal lesions. Other: Postsurgical changes the anterior abdominal wall compatible with recent ventral hernia repair. Stranding of the omental fat anteriorly at the site of operation is nonspecific. A more superior bilobed ventral hernia remains similar to prior with some small  volume of free fluid in the left lobulation, likely postsurgical in nature. Few foci of intraperitoneal gas extending along the liver surface as well as gas and fluid in the gallbladder fossa, likely postsurgical in nature though a more organized collection does raise some suspicion. Small amount of free fluid tracking towards the lower abdomen. Mild body wall edema. Musculoskeletal: Multilevel degenerative changes are present in the imaged portions of the spine. Interspinous arthrosis compatible with Baastrup's disease. Review of the MIP images confirms the above findings. IMPRESSION: CT angiography of the chest: 1. Limited assessment for pulmonary artery embolism given extensive respiratory motion and cardiac pulsation artifact. No large central or lobar filling defects are identified. 2. Mild interstitial pulmonary edema and developing bilateral pleural effusions, right greater than left with adjacent areas of passive atelectasis. 3. Some persistent centrilobular ground-glass nodularity seen in the anterior lingula in a region of previously seen consolidation, could reflect some post infectious or inflammatory sequela. 4. Moderate hiatal hernia with fluid-filled distal thoracic esophagus, recommend correlation for aspiration risk. 5. Three-vessel coronary artery atherosclerosis. 6.  Aortic Atherosclerosis (ICD10-I70.0). CT abdomen and pelvis: 1. Postsurgical changes from recent laparoscopic cholecystectomy as well as conventional incisional hernia repair. 2. A small possibly organizing collection seen within the gallbladder  fossa of admixed air and low to intermediate attenuation fluid with some questionable rim enhancement, could reflect a postoperative biloma, hematoma or seroma. Superinfection cannot be fully excluded. 3. Some expected postsurgical changes along the anterior abdominal wall at the site of a paraumbilical incisional hernia repair including soft tissue gas along the anterior abdominal wall and mild stranding along the omental fat adjacent the operative site. 4. A more superior bilobed fat containing ventral hernia rim remains, grossly similar to comparison prior albeit with some stranding and free fluid in the hernia contents which can be expected postsurgically. 5. Diverticulosis without evidence of acute diverticulitis. 6.  Aortic Atherosclerosis (ICD10-I70.0). Electronically Signed: By: Lovena Le M.D. On: 01/28/2021 21:46   CT Abdomen Pelvis W Contrast  Addendum Date: 01/28/2021   ADDENDUM REPORT: 01/28/2021 21:54 ADDENDUM: These results were called by telephone at the time of interpretation on 01/28/2021 at 9:54 pm to provider Benay Pike MD, who verbally acknowledged these results. Electronically Signed   By: Lovena Le M.D.   On: 01/28/2021 21:54   Result Date: 01/28/2021 CLINICAL DATA:  Laparoscopic cholecystectomy and conventional ventral hernia repair performed 01/25/2021, now with shortness of breath and hypoxia. EXAM: CT ANGIOGRAPHY CHEST CT ABDOMEN AND PELVIS WITH CONTRAST TECHNIQUE: Multidetector CT imaging of the chest was performed using the standard protocol during bolus administration of intravenous contrast. Multiplanar CT image reconstructions and MIPs were obtained to evaluate the vascular anatomy. Multidetector CT imaging of the abdomen and pelvis was performed using the standard protocol during bolus administration of intravenous contrast. CONTRAST:  15m OMNIPAQUE IOHEXOL 350 MG/ML SOLN COMPARISON:  CT abdomen pelvis 11/24/2020, cholangiogram 01/25/2021, CT chest 08/27/2020 FINDINGS: CTA  CHEST FINDINGS Cardiovascular: Fall there is satisfactory opacification of the pulmonary arteries. Exam quality is diminished by extensive cardiac pulsation and respiratory motion artifact throughout the lungs. No large central or lobar filling defects are convincingly demonstrated. More distal evaluation is largely precluded by technical factors. Top-normal cardiac size. Three-vessel coronary artery atherosclerosis. No pericardial effusion. Cardiac pulsation artifact results in suboptimal assessment of the aortic root and ascending thoracic aorta. Mediastinum/Nodes: Few scattered small subcentimeter hypoattenuating nodules in the thyroid gland are unchanged from comparison prior and unlikely to be clinically significant. Thoracic inlet is otherwise unremarkable.  Moderate hiatal hernia. Fluid-filled distal thoracic esophagus. Tracheal and central airways are unremarkable. No concerning mediastinal, hilar or axillary adenopathy. Lungs/Pleura: Small right trace left pleural effusions with some adjacent areas of passive atelectasis and additional dependent atelectatic features posteriorly. Some clustered nodules seen in the anterior lingula are new region of previously seen consolidation could reflect some postinflammatory change. No other focal airspace opacities are seen. Mild interlobular septal thickening could reflect some mild interstitial edema. Musculoskeletal: Exaggerated thoracic kyphosis. Multilevel degenerative changes are present in the imaged portions of the spine. Additional degenerative changes in the bilateral shoulders. No acute or worrisome chest wall mass or lesion. Review of the MIP images confirms the above findings. CT ABDOMEN and PELVIS FINDINGS Hepatobiliary: Normal hepatic attenuation. No concerning focal liver lesion. Question some micro lobulations along the anterior left lobe liver. Postsurgical changes from cholecystectomy with few foci of free gas along the liver surface and in the  gallbladder fossa with a slightly more organized collection towards the terminus of the gallbladder fossa (4/27) measuring approximately 3.3 x 2.2 x 1.9 cm in size. With some surrounding phlegmonous inflammation. Slight prominence of the biliary tree is nonspecific and post cholecystectomy state without visible calcified gallstone. Pancreas: No pancreatic ductal dilatation or surrounding inflammatory changes. Spleen: Normal in size. No concerning splenic lesions. Adrenals/Urinary Tract: Normal adrenal glands. Kidneys enhance and excrete symmetrically and uniformly. Suspect some mild bilateral renal atrophy. Areas of cortical scarring bilaterally. There is a 2 cm fluid attenuation cyst in the interpolar right kidney with a smaller 1.3 cm cyst in the lower pole (series 9, images 24, 15). No concerning renal mass. No urolithiasis or hydronephrosis. Urinary bladder is unremarkable for the degree of distention. Stomach/Bowel: Moderate hiatal hernia. Distal stomach and duodenum are unremarkable. No small bowel thickening or dilatation. No evidence of bowel obstruction. Normal appendix in the right lower quadrant. Scattered colonic diverticula without focal inflammation to suggest diverticulitis. No colonic dilatation or wall thickening. Vascular/Lymphatic: Atherosclerotic calcifications within the abdominal aorta and branch vessels. No aneurysm or ectasia. No pathologically enlarged abdominopelvic lymph nodes. Some reactive appearing adenopathy at the porta hepatis and in the mid mesentery. Reproductive: Uterus is surgically absent. No concerning adnexal lesions. Other: Postsurgical changes the anterior abdominal wall compatible with recent ventral hernia repair. Stranding of the omental fat anteriorly at the site of operation is nonspecific. A more superior bilobed ventral hernia remains similar to prior with some small volume of free fluid in the left lobulation, likely postsurgical in nature. Few foci of  intraperitoneal gas extending along the liver surface as well as gas and fluid in the gallbladder fossa, likely postsurgical in nature though a more organized collection does raise some suspicion. Small amount of free fluid tracking towards the lower abdomen. Mild body wall edema. Musculoskeletal: Multilevel degenerative changes are present in the imaged portions of the spine. Interspinous arthrosis compatible with Baastrup's disease. Review of the MIP images confirms the above findings. IMPRESSION: CT angiography of the chest: 1. Limited assessment for pulmonary artery embolism given extensive respiratory motion and cardiac pulsation artifact. No large central or lobar filling defects are identified. 2. Mild interstitial pulmonary edema and developing bilateral pleural effusions, right greater than left with adjacent areas of passive atelectasis. 3. Some persistent centrilobular ground-glass nodularity seen in the anterior lingula in a region of previously seen consolidation, could reflect some post infectious or inflammatory sequela. 4. Moderate hiatal hernia with fluid-filled distal thoracic esophagus, recommend correlation for aspiration risk. 5. Three-vessel coronary artery atherosclerosis. 6.  Aortic Atherosclerosis (ICD10-I70.0). CT abdomen and pelvis: 1. Postsurgical changes from recent laparoscopic cholecystectomy as well as conventional incisional hernia repair. 2. A small possibly organizing collection seen within the gallbladder fossa of admixed air and low to intermediate attenuation fluid with some questionable rim enhancement, could reflect a postoperative biloma, hematoma or seroma. Superinfection cannot be fully excluded. 3. Some expected postsurgical changes along the anterior abdominal wall at the site of a paraumbilical incisional hernia repair including soft tissue gas along the anterior abdominal wall and mild stranding along the omental fat adjacent the operative site. 4. A more superior  bilobed fat containing ventral hernia rim remains, grossly similar to comparison prior albeit with some stranding and free fluid in the hernia contents which can be expected postsurgically. 5. Diverticulosis without evidence of acute diverticulitis. 6.  Aortic Atherosclerosis (ICD10-I70.0). Electronically Signed: By: Lovena Le M.D. On: 01/28/2021 21:46   ECHOCARDIOGRAM COMPLETE  Result Date: 01/29/2021    ECHOCARDIOGRAM REPORT   Patient Name:   Heather [name] Date of Exam: 01/29/2021 Medical Rec #:  [id number]      Height:       62.0 in Accession #:    [id number]     Weight:       160.9 lb Date of Birth:  1935/06/27      BSA:          1.743 m Patient Age:    43 years       BP:           144/57 mmHg Patient Gender: F              HR:           71 bpm. Exam Location:  Inpatient Procedure: 2D Echo, Cardiac Doppler and Color Doppler Indications:    CHF-Acute Diastolic V76.16  History:        Patient has prior history of Echocardiogram examinations, most                 recent 08/26/2020. CAD, TIA, Arrythmias:RBBB; Risk                 Factors:Diabetes, Hypertension and Dyslipidemia.  Sonographer:    Bernadene Person RDCS Referring Phys: [phone] JARED M GARDNER  Sonographer Comments: Image acquisition challenging due to respiratory motion. IMPRESSIONS  1. Left ventricular ejection fraction, by estimation, is 60 to 65%. The left ventricle has normal function. The left ventricle has no regional wall motion abnormalities. Left ventricular diastolic parameters are indeterminate.  2. Right ventricular systolic function is normal. The right ventricular size is normal. There is normal pulmonary artery systolic pressure. The estimated right ventricular systolic pressure is 10.6 mmHg.  3. The mitral valve is normal in structure. Trivial mitral valve regurgitation. No evidence of mitral stenosis.  4. The aortic valve is tricuspid. Aortic valve regurgitation is not visualized. Mild to moderate aortic valve sclerosis/calcification is  present, without any evidence of aortic stenosis.  5. The inferior vena cava is normal in size with greater than 50% respiratory variability, suggesting right atrial pressure of 3 mmHg. FINDINGS  Left Ventricle: Left ventricular ejection fraction, by estimation, is 60 to 65%. The left ventricle has normal function. The left ventricle has no regional wall motion abnormalities. The left ventricular internal cavity size was normal in size. There is  no left ventricular hypertrophy. Left ventricular diastolic parameters are indeterminate. Normal left ventricular filling pressure. Right Ventricle: The right ventricular size is normal. No increase in right ventricular wall  thickness. Right ventricular systolic function is normal. There is normal pulmonary artery systolic pressure. The tricuspid regurgitant velocity is 2.65 m/s, and  with an assumed right atrial pressure of 3 mmHg, the estimated right ventricular systolic pressure is 84.1 mmHg. Left Atrium: Left atrial size was normal in size. Right Atrium: Right atrial size was normal in size. Pericardium: There is no evidence of pericardial effusion. Mitral Valve: The mitral valve is normal in structure. Mild mitral annular calcification. Trivial mitral valve regurgitation. No evidence of mitral valve stenosis. Tricuspid Valve: The tricuspid valve is normal in structure. Tricuspid valve regurgitation is trivial. No evidence of tricuspid stenosis. Aortic Valve: The aortic valve is tricuspid. Aortic valve regurgitation is not visualized. Mild to moderate aortic valve sclerosis/calcification is present, without any evidence of aortic stenosis. Pulmonic Valve: The pulmonic valve was normal in structure. Pulmonic valve regurgitation is not visualized. No evidence of pulmonic stenosis. Aorta: The aortic root is normal in size and structure. Venous: The inferior vena cava is normal in size with greater than 50% respiratory variability, suggesting right atrial pressure of 3  mmHg. IAS/Shunts: No atrial level shunt detected by color flow Doppler.  LEFT VENTRICLE PLAX 2D LVIDd:         3.00 cm  Diastology LVIDs:         2.10 cm  LV e' medial:    6.60 cm/s LV PW:         0.90 cm  LV E/e' medial:  14.8 LV IVS:        1.10 cm  LV e' lateral:   9.00 cm/s LVOT diam:     1.90 cm  LV E/e' lateral: 10.9 LV SV:         67 LV SV Index:   38 LVOT Area:     2.84 cm  RIGHT VENTRICLE RV S prime:     12.70 cm/s TAPSE (M-mode): 2.0 cm LEFT ATRIUM             Index       RIGHT ATRIUM           Index LA diam:        3.10 cm 1.78 cm/m  RA Area:     11.60 cm LA Vol (A2C):   52.5 ml 30.12 ml/m RA Volume:   24.70 ml  14.17 ml/m LA Vol (A4C):   47.0 ml 26.96 ml/m LA Biplane Vol: 51.4 ml 29.49 ml/m  AORTIC VALVE LVOT Vmax:   116.00 cm/s LVOT Vmean:  79.600 cm/s LVOT VTI:    0.236 m  AORTA Ao Root diam: 2.80 cm Ao Asc diam:  3.20 cm MITRAL VALVE               TRICUSPID VALVE MV Area (PHT): 3.03 cm    TR Peak grad:   28.1 mmHg MV Decel Time: 250 msec    TR Vmax:        265.00 cm/s MV E velocity: 97.90 cm/s MV A velocity: 94.10 cm/s  SHUNTS MV E/A ratio:  1.04        Systemic VTI:  0.24 m                            Systemic Diam: 1.90 cm Fransico Him MD Electronically signed by Fransico Him MD Signature Date/Time: 01/29/2021/10:38:04 AM    Final     Procedures Procedures   Medications Ordered in ED Medications  sodium chloride flush (NS) 0.9 %  injection 3 mL (3 mLs Intravenous Given 01/29/21 1102)  sodium chloride flush (NS) 0.9 % injection 3 mL (has no administration in time range)  0.9 %  sodium chloride infusion (has no administration in time range)  acetaminophen (TYLENOL) tablet 650 mg (has no administration in time range)  ondansetron (ZOFRAN) injection 4 mg (has no administration in time range)  enoxaparin (LOVENOX) injection 40 mg (40 mg Subcutaneous Given 01/29/21 1102)  furosemide (LASIX) injection 40 mg (40 mg Intravenous Given 01/29/21 1102)  amLODipine (NORVASC) tablet 10 mg (10 mg  Oral Given 01/29/21 1101)  aspirin EC tablet 81 mg (81 mg Oral Given 01/29/21 1101)  isosorbide mononitrate (IMDUR) 24 hr tablet 90 mg (has no administration in time range)  famotidine (PEPCID) tablet 20 mg (has no administration in time range)  losartan (COZAAR) tablet 100 mg (100 mg Oral Given 01/29/21 1101)  metoprolol succinate (TOPROL-XL) 24 hr tablet 75 mg (75 mg Oral Not Given 01/29/21 0111)  mirabegron ER (MYRBETRIQ) tablet 25 mg (25 mg Oral Given 01/29/21 1101)  pantoprazole (PROTONIX) EC tablet 40 mg (40 mg Oral Given 01/29/21 1101)  ondansetron (ZOFRAN-ODT) disintegrating tablet 4 mg (has no administration in time range)  potassium chloride (KLOR-CON) CR tablet 10 mEq (has no administration in time range)  spironolactone (ALDACTONE) tablet 12.5 mg (has no administration in time range)  simvastatin (ZOCOR) tablet 20 mg (has no administration in time range)  magnesium oxide (MAG-OX) tablet 400 mg (400 mg Oral Given 01/29/21 1101)  morphine 2 MG/ML injection 2-4 mg (4 mg Intravenous Given 01/29/21 0602)  ketorolac (TORADOL) 15 MG/ML injection 15 mg (has no administration in time range)  iohexol (OMNIPAQUE) 350 MG/ML injection 80 mL (80 mLs Intravenous Contrast Given 01/28/21 2049)  furosemide (LASIX) injection 40 mg (40 mg Intravenous Given 01/28/21 2302)    ED Course  I have reviewed the triage vital signs and the nursing notes.  Pertinent labs & imaging results that were available during my care of the patient were reviewed by me and considered in my medical decision making (see chart for details).    MDM Rules/Calculators/A&P                           86 year old female presenting with new onset hypoxic respiratory failure requiring O2 supplementation and postoperative abdominal pain after cholecystectomy and ventral abdominal wall hernia repair on 01/25/2021.  On arrival, the patient was hypoxic on room air subsequently placed on 2 L O2 nasal nasal cannula.  Differential diagnosis  includes volume overloaded/CHF exacerbation from fluid resuscitation, PE, ACS, COVID-19, bacterial pneumonia.   CT abdomen pelvis and CTA PE study was performed.  Postsurgical changes were noted with possible phlegmonous change in the patient's right upper quadrant in the gallbladder fossa adjacent to the liver.  Concern for possible phlegmon versus seroma versus hematoma.  The patient does not have a leukocytosis and she is currently afebrile without chills.  Her abdominal pain postoperatively appears to be at baseline.  We will hold on antibiotics for now.  The patient CTA PE study was without evidence of PE, showed volume overload with pulmonary edema and bilateral pleural effusions.The patient was administered 40 mg of IV Lasix due to concern for CHF exacerbation resulting in acute hypoxic respiratory failure.  Hospitalist medicine was subsequently consulted for admission.   Final Clinical Impression(s) / ED Diagnoses Final diagnoses:  Post-operative pain  Acute respiratory failure with hypoxia (HCC)  Acute on chronic diastolic congestive  heart failure [facility])    Rx / DC Orders ED Discharge Orders     None        Regan Lemming, MD 01/29/21 1720

## 2021-01-28 NOTE — ED Provider Notes (Signed)
Emergency Medicine Provider Triage Evaluation Note  Heather [name] , a 86 y.o. female  was evaluated in triage.  Patient presents to the ED with concern for hypoxia this AM. She is post op follow cholecystectomy & hernia repair 01/25/21, has been having some abdominal pain post operatively that seems to be getting worse, no BM since surgery, no associated N/V. Over the past 24 hours has started to feel short of breath with chest pain/tightness when attempting to take deep breaths with low grade temps at home. Hx of DVT post knee replacement, not currently on anticoagulation, no current LE pain/swelling.   Review of Systems  Positive: Abdominal pain, chest pain, shortness of breath Negative: Vomiting, dysuria, hemoptysis, leg pain/swelling  Physical Exam  BP (!) 140/57 (BP Location: Right Arm)   Pulse 61   Temp 99.1 F (37.3 C) (Oral)   Resp 15   LMP  (LMP Unknown)   SpO2 94%  Gen:   Awake, no distress   Resp:  Normal effort, no obvious focal adventitious breath sounds. MSK:   Moves extremities without difficulty, no calf tenderness or asymmetric edema. Other:  Bandage over central abdomen which remains in place.  Generalized tenderness to palpation.  Medical Decision Making  Medically screening exam initiated at 6:00 PM.  Appropriate orders placed.  GINAMARIE ALLBEE was informed that the remainder of the evaluation will be completed by another provider, this initial triage assessment does not replace that evaluation, and the importance of remaining in the ED until their evaluation is complete.  Post op pain Dyspnea.    Leafy Kindle 01/28/21 1812    Regan Lemming, MD 01/28/21 [place]

## 2021-01-28 NOTE — ED Triage Notes (Signed)
Had gallbladder sx Wednesday, endorses SOB since the sx with O2 levels in the 80s. Laparoscopic for gallbladder but not for the hernia repair.

## 2021-01-29 ENCOUNTER — Observation Stay (HOSPITAL_BASED_OUTPATIENT_CLINIC_OR_DEPARTMENT_OTHER): Payer: Medicare HMO

## 2021-01-29 DIAGNOSIS — I5031 Acute diastolic (congestive) heart failure: Secondary | ICD-10-CM | POA: Diagnosis not present

## 2021-01-29 LAB — BASIC METABOLIC PANEL
Anion gap: 10 (ref 5–15)
BUN: 21 mg/dL (ref 8–23)
CO2: 25 mmol/L (ref 22–32)
Calcium: 8.8 mg/dL — ABNORMAL LOW (ref 8.9–10.3)
Chloride: 97 mmol/L — ABNORMAL LOW (ref 98–111)
Creatinine, Ser: 1.08 mg/dL — ABNORMAL HIGH (ref 0.44–1.00)
GFR, Estimated: 51 mL/min — ABNORMAL LOW (ref >60)
Glucose, Bld: 109 mg/dL — ABNORMAL HIGH (ref 70–99)
Potassium: 4 mmol/L (ref 3.5–5.1)
Sodium: 132 mmol/L — ABNORMAL LOW (ref 135–145)

## 2021-01-29 LAB — CBC
HCT: 35 % — ABNORMAL LOW (ref 36.0–46.0)
Hemoglobin: 11.6 g/dL — ABNORMAL LOW (ref 12.0–15.0)
MCH: 30.8 pg (ref 26.0–34.0)
MCHC: 33.1 g/dL (ref 30.0–36.0)
MCV: 92.8 fL (ref 80.0–100.0)
Platelets: 139 10*3/uL — ABNORMAL LOW (ref 150–400)
RBC: 3.77 MIL/uL — ABNORMAL LOW (ref 3.87–5.11)
RDW: 12.8 % (ref 11.5–15.5)
WBC: 8.5 10*3/uL (ref 4.0–10.5)
nRBC: 0 % (ref 0.0–0.2)

## 2021-01-29 LAB — ECHOCARDIOGRAM COMPLETE
Area-P 1/2: 3.03 cm2
Height: 62 in
S' Lateral: 2.1 cm
Weight: 2574.97 oz

## 2021-01-29 LAB — TROPONIN I (HIGH SENSITIVITY): Troponin I (High Sensitivity): 15 ng/L (ref <18)

## 2021-01-29 LAB — BRAIN NATRIURETIC PEPTIDE: B Natriuretic Peptide: 453.4 pg/mL — ABNORMAL HIGH (ref 0.0–100.0)

## 2021-01-29 LAB — MAGNESIUM: Magnesium: 1.8 mg/dL (ref 1.7–2.4)

## 2021-01-29 MED ORDER — MORPHINE SULFATE (PF) 2 MG/ML IV SOLN
2.0000 mg | INTRAVENOUS | Status: DC | PRN
  Administered 2021-01-29: 4 mg via INTRAVENOUS
  Filled 2021-01-29: qty 2

## 2021-01-29 MED ORDER — MAGNESIUM OXIDE -MG SUPPLEMENT 400 (240 MG) MG PO TABS
400.0000 mg | ORAL_TABLET | Freq: Two times a day (BID) | ORAL | Status: DC
  Administered 2021-01-29 – 2021-02-02 (×9): 400 mg via ORAL
  Filled 2021-01-29 (×9): qty 1

## 2021-01-29 MED ORDER — KETOROLAC TROMETHAMINE 15 MG/ML IJ SOLN
15.0000 mg | Freq: Four times a day (QID) | INTRAMUSCULAR | Status: DC | PRN
Start: 2021-01-29 — End: 2021-01-30
  Administered 2021-01-29: 15 mg via INTRAVENOUS
  Filled 2021-01-29: qty 1

## 2021-01-29 NOTE — Consult Note (Signed)
  This is an 86 year old female who is status post a laparoscopic cholecystectomy with cholangiogram and repair of an incarcerated incisional hernia on 01/25/2021 by Dr. Johney Maine.  She was discharged home on the 21st. She has a history of DM2, HTN, and CHF. She was admitted last night with shortness of breath.  She has not had a bowel movement since surgery.  She denies nausea and vomiting.  She is also had a sore throat since surgery apparently secondary to the intubation or irritation from the ET tube.  She reports low-grade temperatures at home.  She was found to be in hypoxic respiratory failure secondary to acute on chronic diastolic CHF.  Her white blood count was normal on admission.  She had a CT scan of the abdomen and pelvis which I reviewed.  There are postsurgical changes in the abdominal wall as well the gallbladder fossa.  A possible abscess in the gallbladder fossa cannot be excluded.  There was also question of a recurrent hernia.  On my review and based on her exam and clinical picture, I believe the findings on CT scan are just early postoperative changes.  From a surgical standpoint, she may resume a diet.  We will repeat her CBC and check a CMP in the morning to evaluate her liver function test.  If she continues to have discomfort with swallowing, we may need to get ENT to see her.  We will consider a suppository tomorrow if she has not had a bowel movement.  We will follow her closely with you  Coralie Keens MD [facility] Surgery

## 2021-01-29 NOTE — Plan of Care (Signed)

## 2021-01-29 NOTE — Progress Notes (Signed)
PROGRESS NOTE    Heather [name]  [id number] DOB: 1934/08/01 DOA: 01/28/2021 PCP: Mosie Lukes, MD  Chief Complaint  Patient presents with   Shortness of Breath   Post-op Problem    Brief Narrative:  The patient is an 86 yo w/PMHx of DM2, HTN, HFpEF, prior DVT who underwent recent lap chole and hernia repair on 7/20. She presented with shortness of breath with chest tightness and apparently has had no bowel movements since surgery.   Assessment & Plan:   Principal Problem:   Acute on chronic diastolic CHF (congestive heart failure) (HCC) Active Problems:   Essential hypertension   DM (diabetes mellitus) ([place])   Chronic calculous cholecystitis s/p lap cholecystectomy 01/25/2021   Incarcerated incisional hernia s/p primary repair 01/25/2021   Intra-abdominal fluid collection  Acute hypoxic respiratory failure Suspect iatrogenic related to fluid administration in the setting of likely diastolic dysfunction. - Continue diuresis, furosemide '40mg'$  IV - TTE reviewed: normal LVEF - wean oxygen as able  Fluid collection Suspect seroma, but given abdominal pain and possible phlegmon and leukocytosis, reasonable to consider infection - Consult Gen Surg for further evaluation  Dsyphagia/Odynophagia Suspect related to intubation in the setting of prior esophageal dilation per Pt - magic mouthwash - modified barium/SLP evaluation  Anorexia - likely related to recent surgery - advance diet as tolerated  Hypertension - c/w losartan, metop '75mg'$  XL, amlodipine '10mg'$   Diabetes - A1C 7.9%  Coronary artery disease Coronary calcifications seen on CT - continue statin  DVT prophylaxis: Lovenox Code Status: full code Family Communication: discussed with patient/daughter Disposition: home when stable, c/w med/surg  Status is: Observation  The patient will require care spanning > 2 midnights and should be moved to inpatient because: Ongoing diagnostic testing needed not  appropriate for outpatient work up  Dispo: The patient is from: Home              Anticipated d/c is to: Home              Patient currently is not medically stable to d/c.   Difficult to place patient No   Consultants:  Surgery  Procedures:  none  Antimicrobials:  N/A   Subjective: Overnight, no acute events. Continues to feel poorly. Shortness of breath improving, but her appetite is poor and she continues to have pain with swallowing. She reports abdominal pain as well, denies nausea, vomiting.  Objective: Vitals:   01/29/21 0700 01/29/21 0800 01/29/21 0900 01/29/21 1000  BP: (!) 144/57 (!) 145/54 (!) 150/59 (!) 155/63  Pulse: 71 70 69 70  Resp: '19 17 19 15  '$ Temp:      TempSrc:      SpO2: 93% 94% 93% 96%  Weight:      Height:        Intake/Output Summary (Last 24 hours) at 01/29/2021 1108 Last data filed at 01/29/2021 0544 Gross per 24 hour  Intake 300 ml  Output 2400 ml  Net -2100 ml   Filed Weights   01/28/21 1801  Weight: 73 kg    Examination:  General exam: Appears calm and comfortable  Respiratory system: Clear to auscultation. Decreased breath sounds at bases. Cardiovascular system: S1 & S2 heard, RRR. No pedal edema. Crescendo-decrescendo murmur heard throughout precordium Gastrointestinal system: Abdomen is nondistended, soft and nontender. No organomegaly or masses felt. Normal bowel sounds heard. Central nervous system: Alert and oriented. No focal neurological deficits. Extremities: Normal muscle tone/bulk Skin: No rashes, lesions or ulcers Psychiatry: Judgement and insight  appear normal. Mood & affect appropriate.   Data Reviewed: I have personally reviewed following labs and imaging studies  CBC: Recent Labs  Lab 01/26/21 0413 01/28/21 1923 01/29/21 0125  WBC 13.7* 9.4 8.5  NEUTROABS  --  7.7  --   HGB 11.6* 11.9* 11.6*  HCT 35.4* 36.0 35.0*  MCV 92.9 94.0 92.8  PLT 133* 143* 139*    Basic Metabolic Panel: Recent Labs  Lab  01/26/21 0413 01/28/21 1923 01/29/21 0125  NA 136 133* 132*  K 4.7 5.1 4.0  CL 103 98 97*  CO2 '23 25 25  '$ GLUCOSE 264* 129* 109*  BUN 22 25* 21  CREATININE 1.05* 1.08* 1.08*  CALCIUM 8.8* 9.0 8.8*  MG 1.5*  --  1.8    GFR: Estimated Creatinine Clearance: 36.3 mL/min (A) (by C-G formula based on SCr of 1.08 mg/dL (H)).  Liver Function Tests: Recent Labs  Lab 01/28/21 1923  AST 59*  ALT 80*  ALKPHOS 134*  BILITOT 1.2  PROT 6.6  ALBUMIN 3.6    CBG: Recent Labs  Lab 01/24/21 1143 01/25/21 1301 01/26/21 0743 01/26/21 1132  GLUCAP 157* 141* 219* 184*     Recent Results (from the past 240 hour(s))  SARS CORONAVIRUS 2 (TAT 6-24 HRS) Nasopharyngeal Nasopharyngeal Swab     Status: None   Collection Time: 01/24/21  2:02 PM   Specimen: Nasopharyngeal Swab  Result Value Ref Range Status   SARS Coronavirus 2 NEGATIVE NEGATIVE Final    Comment: (NOTE) SARS-CoV-2 target nucleic acids are NOT DETECTED.  The SARS-CoV-2 RNA is generally detectable in upper and lower respiratory specimens during the acute phase of infection. Negative results do not preclude SARS-CoV-2 infection, do not rule out co-infections with other pathogens, and should not be used as the sole basis for treatment or other patient management decisions. Negative results must be combined with clinical observations, patient history, and epidemiological information. The expected result is Negative.  Fact Sheet for Patients: SugarRoll.be  Fact Sheet for Healthcare Providers: https://www.woods-mathews.com/  This test is not yet approved or cleared by the [place] FDA and  has been authorized for detection and/or diagnosis of SARS-CoV-2 by FDA under an Emergency Use Authorization (EUA). This EUA will remain  in effect (meaning this test can be used) for the duration of the COVID-19 declaration under Se ction 564(b)(1) of the Act, 21 U.S.C. section  360bbb-3(b)(1), unless the authorization is terminated or revoked sooner.  Performed at [facility], [place] [address], [place], [place] [address]   Resp Panel by RT-PCR (Flu A&B, Covid) Nasopharyngeal Swab     Status: None   Collection Time: 01/28/21  7:23 PM   Specimen: Nasopharyngeal Swab; Nasopharyngeal(NP) swabs in vial transport medium  Result Value Ref Range Status   SARS Coronavirus 2 by RT PCR NEGATIVE NEGATIVE Final    Comment: (NOTE) SARS-CoV-2 target nucleic acids are NOT DETECTED.  The SARS-CoV-2 RNA is generally detectable in upper respiratory specimens during the acute phase of infection. The lowest concentration of SARS-CoV-2 viral copies this assay can detect is 138 copies/mL. A negative result does not preclude SARS-Cov-2 infection and should not be used as the sole basis for treatment or other patient management decisions. A negative result may occur with  improper specimen collection/handling, submission of specimen other than nasopharyngeal swab, presence of viral mutation(s) within the areas targeted by this assay, and inadequate number of viral copies(<138 copies/mL). A negative result must be combined with clinical observations, patient history, and  epidemiological information. The expected result is Negative.  Fact Sheet for Patients:  EntrepreneurPulse.com.au  Fact Sheet for Healthcare Providers:  IncredibleEmployment.be  This test is no t yet approved or cleared by the [place] FDA and  has been authorized for detection and/or diagnosis of SARS-CoV-2 by FDA under an Emergency Use Authorization (EUA). This EUA will remain  in effect (meaning this test can be used) for the duration of the COVID-19 declaration under Section 564(b)(1) of the Act, 21 U.S.C.section 360bbb-3(b)(1), unless the authorization is terminated  or revoked sooner.       Influenza A by PCR NEGATIVE NEGATIVE Final   Influenza B by PCR  NEGATIVE NEGATIVE Final    Comment: (NOTE) The Xpert Xpress SARS-CoV-2/FLU/RSV plus assay is intended as an aid in the diagnosis of influenza from Nasopharyngeal swab specimens and should not be used as a sole basis for treatment. Nasal washings and aspirates are unacceptable for Xpert Xpress SARS-CoV-2/FLU/RSV testing.  Fact Sheet for Patients: EntrepreneurPulse.com.au  Fact Sheet for Healthcare Providers: IncredibleEmployment.be  This test is not yet approved or cleared by the [place] FDA and has been authorized for detection and/or diagnosis of SARS-CoV-2 by FDA under an Emergency Use Authorization (EUA). This EUA will remain in effect (meaning this test can be used) for the duration of the COVID-19 declaration under Section 564(b)(1) of the Act, 21 U.S.C. section 360bbb-3(b)(1), unless the authorization is terminated or revoked.  Performed at [facility], [place] [address], [place], [place] [address]          Radiology Studies: DG Chest 2 View  Result Date: 01/28/2021 CLINICAL DATA:  Cough. Hypoxia. Abdominal pain, status post cholecystectomy and hernia repair 01/25/2021. EXAM: CHEST - 2 VIEW COMPARISON:  09/05/2020 FINDINGS: Mild blunting of both costophrenic angles. Linear opacities at the lung bases favoring subsegmental atelectasis. The patient is rotated to the right on today's radiograph, reducing diagnostic sensitivity and specificity. No appreciable pneumothorax. Mild thoracic kyphosis. Atherosclerotic calcification of the aortic arch. IMPRESSION: 1. Mild bibasilar atelectasis with blunting of the costophrenic angle suggesting small bilateral pleural effusions. No edema. 2.  Aortic Atherosclerosis (ICD10-I70.0). Electronically Signed   By: Van Clines M.D.   On: 01/28/2021 18:30   CT Angio Chest PE W/Cm &/Or Wo Cm  Addendum Date: 01/28/2021   ADDENDUM REPORT: 01/28/2021 21:54 ADDENDUM: These results  were called by telephone at the time of interpretation on 01/28/2021 at 9:54 pm to provider Benay Pike MD, who verbally acknowledged these results. Electronically Signed   By: Lovena Le M.D.   On: 01/28/2021 21:54   Result Date: 01/28/2021 CLINICAL DATA:  Laparoscopic cholecystectomy and conventional ventral hernia repair performed 01/25/2021, now with shortness of breath and hypoxia. EXAM: CT ANGIOGRAPHY CHEST CT ABDOMEN AND PELVIS WITH CONTRAST TECHNIQUE: Multidetector CT imaging of the chest was performed using the standard protocol during bolus administration of intravenous contrast. Multiplanar CT image reconstructions and MIPs were obtained to evaluate the vascular anatomy. Multidetector CT imaging of the abdomen and pelvis was performed using the standard protocol during bolus administration of intravenous contrast. CONTRAST:  62m OMNIPAQUE IOHEXOL 350 MG/ML SOLN COMPARISON:  CT abdomen pelvis 11/24/2020, cholangiogram 01/25/2021, CT chest 08/27/2020 FINDINGS: CTA CHEST FINDINGS Cardiovascular: Fall there is satisfactory opacification of the pulmonary arteries. Exam quality is diminished by extensive cardiac pulsation and respiratory motion artifact throughout the lungs. No large central or lobar filling defects are convincingly demonstrated. More distal evaluation is largely precluded by technical factors. Top-normal cardiac size. Three-vessel coronary artery  atherosclerosis. No pericardial effusion. Cardiac pulsation artifact results in suboptimal assessment of the aortic root and ascending thoracic aorta. Mediastinum/Nodes: Few scattered small subcentimeter hypoattenuating nodules in the thyroid gland are unchanged from comparison prior and unlikely to be clinically significant. Thoracic inlet is otherwise unremarkable. Moderate hiatal hernia. Fluid-filled distal thoracic esophagus. Tracheal and central airways are unremarkable. No concerning mediastinal, hilar or axillary adenopathy. Lungs/Pleura: Small  right trace left pleural effusions with some adjacent areas of passive atelectasis and additional dependent atelectatic features posteriorly. Some clustered nodules seen in the anterior lingula are new region of previously seen consolidation could reflect some postinflammatory change. No other focal airspace opacities are seen. Mild interlobular septal thickening could reflect some mild interstitial edema. Musculoskeletal: Exaggerated thoracic kyphosis. Multilevel degenerative changes are present in the imaged portions of the spine. Additional degenerative changes in the bilateral shoulders. No acute or worrisome chest wall mass or lesion. Review of the MIP images confirms the above findings. CT ABDOMEN and PELVIS FINDINGS Hepatobiliary: Normal hepatic attenuation. No concerning focal liver lesion. Question some micro lobulations along the anterior left lobe liver. Postsurgical changes from cholecystectomy with few foci of free gas along the liver surface and in the gallbladder fossa with a slightly more organized collection towards the terminus of the gallbladder fossa (4/27) measuring approximately 3.3 x 2.2 x 1.9 cm in size. With some surrounding phlegmonous inflammation. Slight prominence of the biliary tree is nonspecific and post cholecystectomy state without visible calcified gallstone. Pancreas: No pancreatic ductal dilatation or surrounding inflammatory changes. Spleen: Normal in size. No concerning splenic lesions. Adrenals/Urinary Tract: Normal adrenal glands. Kidneys enhance and excrete symmetrically and uniformly. Suspect some mild bilateral renal atrophy. Areas of cortical scarring bilaterally. There is a 2 cm fluid attenuation cyst in the interpolar right kidney with a smaller 1.3 cm cyst in the lower pole (series 9, images 24, 15). No concerning renal mass. No urolithiasis or hydronephrosis. Urinary bladder is unremarkable for the degree of distention. Stomach/Bowel: Moderate hiatal hernia. Distal  stomach and duodenum are unremarkable. No small bowel thickening or dilatation. No evidence of bowel obstruction. Normal appendix in the right lower quadrant. Scattered colonic diverticula without focal inflammation to suggest diverticulitis. No colonic dilatation or wall thickening. Vascular/Lymphatic: Atherosclerotic calcifications within the abdominal aorta and branch vessels. No aneurysm or ectasia. No pathologically enlarged abdominopelvic lymph nodes. Some reactive appearing adenopathy at the porta hepatis and in the mid mesentery. Reproductive: Uterus is surgically absent. No concerning adnexal lesions. Other: Postsurgical changes the anterior abdominal wall compatible with recent ventral hernia repair. Stranding of the omental fat anteriorly at the site of operation is nonspecific. A more superior bilobed ventral hernia remains similar to prior with some small volume of free fluid in the left lobulation, likely postsurgical in nature. Few foci of intraperitoneal gas extending along the liver surface as well as gas and fluid in the gallbladder fossa, likely postsurgical in nature though a more organized collection does raise some suspicion. Small amount of free fluid tracking towards the lower abdomen. Mild body wall edema. Musculoskeletal: Multilevel degenerative changes are present in the imaged portions of the spine. Interspinous arthrosis compatible with Baastrup's disease. Review of the MIP images confirms the above findings. IMPRESSION: CT angiography of the chest: 1. Limited assessment for pulmonary artery embolism given extensive respiratory motion and cardiac pulsation artifact. No large central or lobar filling defects are identified. 2. Mild interstitial pulmonary edema and developing bilateral pleural effusions, right greater than left with adjacent areas of passive atelectasis.  3. Some persistent centrilobular ground-glass nodularity seen in the anterior lingula in a region of previously seen  consolidation, could reflect some post infectious or inflammatory sequela. 4. Moderate hiatal hernia with fluid-filled distal thoracic esophagus, recommend correlation for aspiration risk. 5. Three-vessel coronary artery atherosclerosis. 6.  Aortic Atherosclerosis (ICD10-I70.0). CT abdomen and pelvis: 1. Postsurgical changes from recent laparoscopic cholecystectomy as well as conventional incisional hernia repair. 2. A small possibly organizing collection seen within the gallbladder fossa of admixed air and low to intermediate attenuation fluid with some questionable rim enhancement, could reflect a postoperative biloma, hematoma or seroma. Superinfection cannot be fully excluded. 3. Some expected postsurgical changes along the anterior abdominal wall at the site of a paraumbilical incisional hernia repair including soft tissue gas along the anterior abdominal wall and mild stranding along the omental fat adjacent the operative site. 4. A more superior bilobed fat containing ventral hernia rim remains, grossly similar to comparison prior albeit with some stranding and free fluid in the hernia contents which can be expected postsurgically. 5. Diverticulosis without evidence of acute diverticulitis. 6.  Aortic Atherosclerosis (ICD10-I70.0). Electronically Signed: By: Lovena Le M.D. On: 01/28/2021 21:46   CT Abdomen Pelvis W Contrast  Addendum Date: 01/28/2021   ADDENDUM REPORT: 01/28/2021 21:54 ADDENDUM: These results were called by telephone at the time of interpretation on 01/28/2021 at 9:54 pm to provider Benay Pike MD, who verbally acknowledged these results. Electronically Signed   By: Lovena Le M.D.   On: 01/28/2021 21:54   Result Date: 01/28/2021 CLINICAL DATA:  Laparoscopic cholecystectomy and conventional ventral hernia repair performed 01/25/2021, now with shortness of breath and hypoxia. EXAM: CT ANGIOGRAPHY CHEST CT ABDOMEN AND PELVIS WITH CONTRAST TECHNIQUE: Multidetector CT imaging of the chest  was performed using the standard protocol during bolus administration of intravenous contrast. Multiplanar CT image reconstructions and MIPs were obtained to evaluate the vascular anatomy. Multidetector CT imaging of the abdomen and pelvis was performed using the standard protocol during bolus administration of intravenous contrast. CONTRAST:  20m OMNIPAQUE IOHEXOL 350 MG/ML SOLN COMPARISON:  CT abdomen pelvis 11/24/2020, cholangiogram 01/25/2021, CT chest 08/27/2020 FINDINGS: CTA CHEST FINDINGS Cardiovascular: Fall there is satisfactory opacification of the pulmonary arteries. Exam quality is diminished by extensive cardiac pulsation and respiratory motion artifact throughout the lungs. No large central or lobar filling defects are convincingly demonstrated. More distal evaluation is largely precluded by technical factors. Top-normal cardiac size. Three-vessel coronary artery atherosclerosis. No pericardial effusion. Cardiac pulsation artifact results in suboptimal assessment of the aortic root and ascending thoracic aorta. Mediastinum/Nodes: Few scattered small subcentimeter hypoattenuating nodules in the thyroid gland are unchanged from comparison prior and unlikely to be clinically significant. Thoracic inlet is otherwise unremarkable. Moderate hiatal hernia. Fluid-filled distal thoracic esophagus. Tracheal and central airways are unremarkable. No concerning mediastinal, hilar or axillary adenopathy. Lungs/Pleura: Small right trace left pleural effusions with some adjacent areas of passive atelectasis and additional dependent atelectatic features posteriorly. Some clustered nodules seen in the anterior lingula are new region of previously seen consolidation could reflect some postinflammatory change. No other focal airspace opacities are seen. Mild interlobular septal thickening could reflect some mild interstitial edema. Musculoskeletal: Exaggerated thoracic kyphosis. Multilevel degenerative changes are  present in the imaged portions of the spine. Additional degenerative changes in the bilateral shoulders. No acute or worrisome chest wall mass or lesion. Review of the MIP images confirms the above findings. CT ABDOMEN and PELVIS FINDINGS Hepatobiliary: Normal hepatic attenuation. No concerning focal liver lesion. Question some micro lobulations  along the anterior left lobe liver. Postsurgical changes from cholecystectomy with few foci of free gas along the liver surface and in the gallbladder fossa with a slightly more organized collection towards the terminus of the gallbladder fossa (4/27) measuring approximately 3.3 x 2.2 x 1.9 cm in size. With some surrounding phlegmonous inflammation. Slight prominence of the biliary tree is nonspecific and post cholecystectomy state without visible calcified gallstone. Pancreas: No pancreatic ductal dilatation or surrounding inflammatory changes. Spleen: Normal in size. No concerning splenic lesions. Adrenals/Urinary Tract: Normal adrenal glands. Kidneys enhance and excrete symmetrically and uniformly. Suspect some mild bilateral renal atrophy. Areas of cortical scarring bilaterally. There is a 2 cm fluid attenuation cyst in the interpolar right kidney with a smaller 1.3 cm cyst in the lower pole (series 9, images 24, 15). No concerning renal mass. No urolithiasis or hydronephrosis. Urinary bladder is unremarkable for the degree of distention. Stomach/Bowel: Moderate hiatal hernia. Distal stomach and duodenum are unremarkable. No small bowel thickening or dilatation. No evidence of bowel obstruction. Normal appendix in the right lower quadrant. Scattered colonic diverticula without focal inflammation to suggest diverticulitis. No colonic dilatation or wall thickening. Vascular/Lymphatic: Atherosclerotic calcifications within the abdominal aorta and branch vessels. No aneurysm or ectasia. No pathologically enlarged abdominopelvic lymph nodes. Some reactive appearing adenopathy  at the porta hepatis and in the mid mesentery. Reproductive: Uterus is surgically absent. No concerning adnexal lesions. Other: Postsurgical changes the anterior abdominal wall compatible with recent ventral hernia repair. Stranding of the omental fat anteriorly at the site of operation is nonspecific. A more superior bilobed ventral hernia remains similar to prior with some small volume of free fluid in the left lobulation, likely postsurgical in nature. Few foci of intraperitoneal gas extending along the liver surface as well as gas and fluid in the gallbladder fossa, likely postsurgical in nature though a more organized collection does raise some suspicion. Small amount of free fluid tracking towards the lower abdomen. Mild body wall edema. Musculoskeletal: Multilevel degenerative changes are present in the imaged portions of the spine. Interspinous arthrosis compatible with Baastrup's disease. Review of the MIP images confirms the above findings. IMPRESSION: CT angiography of the chest: 1. Limited assessment for pulmonary artery embolism given extensive respiratory motion and cardiac pulsation artifact. No large central or lobar filling defects are identified. 2. Mild interstitial pulmonary edema and developing bilateral pleural effusions, right greater than left with adjacent areas of passive atelectasis. 3. Some persistent centrilobular ground-glass nodularity seen in the anterior lingula in a region of previously seen consolidation, could reflect some post infectious or inflammatory sequela. 4. Moderate hiatal hernia with fluid-filled distal thoracic esophagus, recommend correlation for aspiration risk. 5. Three-vessel coronary artery atherosclerosis. 6.  Aortic Atherosclerosis (ICD10-I70.0). CT abdomen and pelvis: 1. Postsurgical changes from recent laparoscopic cholecystectomy as well as conventional incisional hernia repair. 2. A small possibly organizing collection seen within the gallbladder fossa of  admixed air and low to intermediate attenuation fluid with some questionable rim enhancement, could reflect a postoperative biloma, hematoma or seroma. Superinfection cannot be fully excluded. 3. Some expected postsurgical changes along the anterior abdominal wall at the site of a paraumbilical incisional hernia repair including soft tissue gas along the anterior abdominal wall and mild stranding along the omental fat adjacent the operative site. 4. A more superior bilobed fat containing ventral hernia rim remains, grossly similar to comparison prior albeit with some stranding and free fluid in the hernia contents which can be expected postsurgically. 5. Diverticulosis without evidence of  acute diverticulitis. 6.  Aortic Atherosclerosis (ICD10-I70.0). Electronically Signed: By: Lovena Le M.D. On: 01/28/2021 21:46   ECHOCARDIOGRAM COMPLETE  Result Date: 01/29/2021    ECHOCARDIOGRAM REPORT   Patient Name:   Heather [name] Date of Exam: 01/29/2021 Medical Rec #:  [id number]      Height:       62.0 in Accession #:    [id number]     Weight:       160.9 lb Date of Birth:  August 11, 1934      BSA:          1.743 m Patient Age:    25 years       BP:           144/57 mmHg Patient Gender: F              HR:           71 bpm. Exam Location:  Inpatient Procedure: 2D Echo, Cardiac Doppler and Color Doppler Indications:    CHF-Acute Diastolic [id number]  History:        Patient has prior history of Echocardiogram examinations, most                 recent 08/26/2020. CAD, TIA, Arrythmias:RBBB; Risk                 Factors:Diabetes, Hypertension and Dyslipidemia.  Sonographer:    Bernadene Person RDCS Referring Phys: [phone] JARED M GARDNER  Sonographer Comments: Image acquisition challenging due to respiratory motion. IMPRESSIONS  1. Left ventricular ejection fraction, by estimation, is 60 to 65%. The left ventricle has normal function. The left ventricle has no regional wall motion abnormalities. Left ventricular diastolic parameters are  indeterminate.  2. Right ventricular systolic function is normal. The right ventricular size is normal. There is normal pulmonary artery systolic pressure. The estimated right ventricular systolic pressure is 99991111 mmHg.  3. The mitral valve is normal in structure. Trivial mitral valve regurgitation. No evidence of mitral stenosis.  4. The aortic valve is tricuspid. Aortic valve regurgitation is not visualized. Mild to moderate aortic valve sclerosis/calcification is present, without any evidence of aortic stenosis.  5. The inferior vena cava is normal in size with greater than 50% respiratory variability, suggesting right atrial pressure of 3 mmHg. FINDINGS  Left Ventricle: Left ventricular ejection fraction, by estimation, is 60 to 65%. The left ventricle has normal function. The left ventricle has no regional wall motion abnormalities. The left ventricular internal cavity size was normal in size. There is  no left ventricular hypertrophy. Left ventricular diastolic parameters are indeterminate. Normal left ventricular filling pressure. Right Ventricle: The right ventricular size is normal. No increase in right ventricular wall thickness. Right ventricular systolic function is normal. There is normal pulmonary artery systolic pressure. The tricuspid regurgitant velocity is 2.65 m/s, and  with an assumed right atrial pressure of 3 mmHg, the estimated right ventricular systolic pressure is 99991111 mmHg. Left Atrium: Left atrial size was normal in size. Right Atrium: Right atrial size was normal in size. Pericardium: There is no evidence of pericardial effusion. Mitral Valve: The mitral valve is normal in structure. Mild mitral annular calcification. Trivial mitral valve regurgitation. No evidence of mitral valve stenosis. Tricuspid Valve: The tricuspid valve is normal in structure. Tricuspid valve regurgitation is trivial. No evidence of tricuspid stenosis. Aortic Valve: The aortic valve is tricuspid. Aortic valve  regurgitation is not visualized. Mild to moderate aortic valve sclerosis/calcification is present, without any evidence of aortic  stenosis. Pulmonic Valve: The pulmonic valve was normal in structure. Pulmonic valve regurgitation is not visualized. No evidence of pulmonic stenosis. Aorta: The aortic root is normal in size and structure. Venous: The inferior vena cava is normal in size with greater than 50% respiratory variability, suggesting right atrial pressure of 3 mmHg. IAS/Shunts: No atrial level shunt detected by color flow Doppler.  LEFT VENTRICLE PLAX 2D LVIDd:         3.00 cm  Diastology LVIDs:         2.10 cm  LV e' medial:    6.60 cm/s LV PW:         0.90 cm  LV E/e' medial:  14.8 LV IVS:        1.10 cm  LV e' lateral:   9.00 cm/s LVOT diam:     1.90 cm  LV E/e' lateral: 10.9 LV SV:         67 LV SV Index:   38 LVOT Area:     2.84 cm  RIGHT VENTRICLE RV S prime:     12.70 cm/s TAPSE (M-mode): 2.0 cm LEFT ATRIUM             Index       RIGHT ATRIUM           Index LA diam:        3.10 cm 1.78 cm/m  RA Area:     11.60 cm LA Vol (A2C):   52.5 ml 30.12 ml/m RA Volume:   24.70 ml  14.17 ml/m LA Vol (A4C):   47.0 ml 26.96 ml/m LA Biplane Vol: 51.4 ml 29.49 ml/m  AORTIC VALVE LVOT Vmax:   116.00 cm/s LVOT Vmean:  79.600 cm/s LVOT VTI:    0.236 m  AORTA Ao Root diam: 2.80 cm Ao Asc diam:  3.20 cm MITRAL VALVE               TRICUSPID VALVE MV Area (PHT): 3.03 cm    TR Peak grad:   28.1 mmHg MV Decel Time: 250 msec    TR Vmax:        265.00 cm/s MV E velocity: 97.90 cm/s MV A velocity: 94.10 cm/s  SHUNTS MV E/A ratio:  1.04        Systemic VTI:  0.24 m                            Systemic Diam: 1.90 cm Fransico Him MD Electronically signed by Fransico Him MD Signature Date/Time: 01/29/2021/10:38:04 AM    Final         Scheduled Meds:  amLODipine  10 mg Oral Daily   aspirin EC  81 mg Oral Daily   enoxaparin (LOVENOX) injection  40 mg Subcutaneous Q24H   furosemide  40 mg Intravenous Daily    isosorbide mononitrate  90 mg Oral QHS   losartan  100 mg Oral Daily   magnesium oxide  400 mg Oral BID   metoprolol succinate  75 mg Oral QHS   mirabegron ER  25 mg Oral Daily   pantoprazole  40 mg Oral Daily   potassium chloride SA  10 mEq Oral QPM   simvastatin  20 mg Oral QHS   sodium chloride flush  3 mL Intravenous Q12H   [START ON 01/30/2021] spironolactone  12.5 mg Oral Q M,W,F   Continuous Infusions:  sodium chloride       LOS: 0 days  Cecille Rubin, MD [facility]   To contact the attending physician between 7A-7P or the covering provider during after hours 7P-7A, please log into the web site [email or web address] and access using universal [place] password for that web site. If you do not have the password, please call the hospital operator.  01/29/2021, 11:08 AM

## 2021-01-29 NOTE — Progress Notes (Signed)
  Echocardiogram 2D Echocardiogram has been performed.  Fidel Levy 01/29/2021, 8:41 AM

## 2021-01-30 ENCOUNTER — Encounter (HOSPITAL_COMMUNITY): Payer: Self-pay | Admitting: Internal Medicine

## 2021-01-30 ENCOUNTER — Observation Stay (HOSPITAL_BASED_OUTPATIENT_CLINIC_OR_DEPARTMENT_OTHER): Payer: Medicare HMO

## 2021-01-30 ENCOUNTER — Observation Stay (HOSPITAL_COMMUNITY): Payer: Medicare HMO

## 2021-01-30 DIAGNOSIS — R609 Edema, unspecified: Secondary | ICD-10-CM

## 2021-01-30 DIAGNOSIS — R531 Weakness: Secondary | ICD-10-CM | POA: Diagnosis not present

## 2021-01-30 DIAGNOSIS — G8918 Other acute postprocedural pain: Secondary | ICD-10-CM | POA: Diagnosis not present

## 2021-01-30 DIAGNOSIS — N3001 Acute cystitis with hematuria: Secondary | ICD-10-CM | POA: Diagnosis not present

## 2021-01-30 DIAGNOSIS — R1312 Dysphagia, oropharyngeal phase: Secondary | ICD-10-CM | POA: Diagnosis not present

## 2021-01-30 LAB — CBC
HCT: 33.7 % — ABNORMAL LOW (ref 36.0–46.0)
Hemoglobin: 11.2 g/dL — ABNORMAL LOW (ref 12.0–15.0)
MCH: 31 pg (ref 26.0–34.0)
MCHC: 33.2 g/dL (ref 30.0–36.0)
MCV: 93.4 fL (ref 80.0–100.0)
Platelets: 144 10*3/uL — ABNORMAL LOW (ref 150–400)
RBC: 3.61 MIL/uL — ABNORMAL LOW (ref 3.87–5.11)
RDW: 12.8 % (ref 11.5–15.5)
WBC: 6.2 10*3/uL (ref 4.0–10.5)
nRBC: 0 % (ref 0.0–0.2)

## 2021-01-30 LAB — COMPREHENSIVE METABOLIC PANEL
ALT: 57 U/L — ABNORMAL HIGH (ref 0–44)
AST: 33 U/L (ref 15–41)
Albumin: 2.9 g/dL — ABNORMAL LOW (ref 3.5–5.0)
Alkaline Phosphatase: 188 U/L — ABNORMAL HIGH (ref 38–126)
Anion gap: 9 (ref 5–15)
BUN: 22 mg/dL (ref 8–23)
CO2: 28 mmol/L (ref 22–32)
Calcium: 8.5 mg/dL — ABNORMAL LOW (ref 8.9–10.3)
Chloride: 95 mmol/L — ABNORMAL LOW (ref 98–111)
Creatinine, Ser: 1.1 mg/dL — ABNORMAL HIGH (ref 0.44–1.00)
GFR, Estimated: 50 mL/min — ABNORMAL LOW (ref >60)
Glucose, Bld: 124 mg/dL — ABNORMAL HIGH (ref 70–99)
Potassium: 3.7 mmol/L (ref 3.5–5.1)
Sodium: 132 mmol/L — ABNORMAL LOW (ref 135–145)
Total Bilirubin: 0.9 mg/dL (ref 0.3–1.2)
Total Protein: 5.7 g/dL — ABNORMAL LOW (ref 6.5–8.1)

## 2021-01-30 LAB — GLUCOSE, CAPILLARY
Glucose-Capillary: 118 mg/dL — ABNORMAL HIGH (ref 70–99)
Glucose-Capillary: 129 mg/dL — ABNORMAL HIGH (ref 70–99)

## 2021-01-30 MED ORDER — INSULIN ASPART 100 UNIT/ML IJ SOLN
0.0000 [IU] | Freq: Three times a day (TID) | INTRAMUSCULAR | Status: DC
  Administered 2021-01-31 (×2): 1 [IU] via SUBCUTANEOUS
  Administered 2021-02-01: 3 [IU] via SUBCUTANEOUS

## 2021-01-30 MED ORDER — BISACODYL 10 MG RE SUPP
10.0000 mg | Freq: Once | RECTAL | Status: AC
  Administered 2021-01-30: 10 mg via RECTAL
  Filled 2021-01-30: qty 1

## 2021-01-30 MED ORDER — FUROSEMIDE 20 MG PO TABS
20.0000 mg | ORAL_TABLET | Freq: Every day | ORAL | Status: DC
  Administered 2021-01-30: 20 mg via ORAL
  Filled 2021-01-30: qty 1

## 2021-01-30 MED ORDER — INSULIN ASPART 100 UNIT/ML IJ SOLN: 0.0000 [IU] | Freq: Every day | INTRAMUSCULAR | Status: DC

## 2021-01-30 MED ORDER — FLEET ENEMA 7-19 GM/118ML RE ENEM: 1.0000 | ENEMA | Freq: Every day | RECTAL | Status: DC | PRN

## 2021-01-30 MED ORDER — SENNOSIDES-DOCUSATE SODIUM 8.6-50 MG PO TABS: 1.0000 | ORAL_TABLET | Freq: Two times a day (BID) | ORAL | Status: DC | PRN

## 2021-01-30 MED ORDER — LOSARTAN POTASSIUM 50 MG PO TABS: 50.0000 mg | ORAL_TABLET | Freq: Every day | ORAL | Status: DC

## 2021-01-30 MED ORDER — MELATONIN 3 MG PO TABS
3.0000 mg | ORAL_TABLET | Freq: Once | ORAL | Status: AC
  Administered 2021-01-30: 3 mg via ORAL
  Filled 2021-01-30: qty 1

## 2021-01-30 MED ORDER — PRAMIPEXOLE DIHYDROCHLORIDE 0.25 MG PO TABS
0.2500 mg | ORAL_TABLET | Freq: Two times a day (BID) | ORAL | Status: DC
  Administered 2021-01-30 – 2021-02-02 (×6): 0.25 mg via ORAL
  Filled 2021-01-30 (×8): qty 1

## 2021-01-30 MED ORDER — INSULIN ASPART 100 UNIT/ML IJ SOLN
2.0000 [IU] | Freq: Three times a day (TID) | INTRAMUSCULAR | Status: DC
  Administered 2021-01-30 – 2021-02-02 (×6): 2 [IU] via SUBCUTANEOUS

## 2021-01-30 MED ORDER — VITAMIN D3 25 MCG (1000 UNIT) PO TABS
2000.0000 [IU] | ORAL_TABLET | Freq: Every day | ORAL | Status: DC
  Administered 2021-01-30 – 2021-02-02 (×4): 2000 [IU] via ORAL
  Filled 2021-01-30 (×4): qty 2

## 2021-01-30 MED ORDER — ISOSORBIDE MONONITRATE ER 60 MG PO TB24
60.0000 mg | ORAL_TABLET | Freq: Every day | ORAL | Status: DC
  Administered 2021-01-30 – 2021-02-01 (×3): 60 mg via ORAL
  Filled 2021-01-30 (×3): qty 1

## 2021-01-30 MED ORDER — POLYETHYLENE GLYCOL 3350 17 G PO PACK: 17.0000 g | PACK | Freq: Two times a day (BID) | ORAL | Status: DC | PRN

## 2021-01-30 MED ORDER — POLYETHYLENE GLYCOL 3350 17 G PO PACK
17.0000 g | PACK | Freq: Two times a day (BID) | ORAL | Status: DC
  Administered 2021-01-30 – 2021-01-31 (×3): 17 g via ORAL
  Filled 2021-01-30 (×6): qty 1

## 2021-01-30 NOTE — Progress Notes (Signed)
PROGRESS NOTE  Heather [name] [id number] DOB: 04/27/1935   PCP: Mosie Lukes, MD  Patient is from: Home.  Lately using walker for ambulation.  DOA: 01/28/2021 LOS: 0  Chief complaints:  Chief Complaint  Patient presents with   Shortness of Breath   Post-op Problem     Brief Narrative / Interim history: 86 year old F with PMH of DM-2, diastolic CHF, HTN, prior DVT not on AC and recent lap chole and hernia repair on 7/20 presenting with shortness of breath and chest tightness and admitted for acute respiratory failure with hypoxia presumed to be due to acute on chronic diastolic CHF.  She also has some dysphagia, odynophagia, anorexia and constipation after surgery.   Subjective: Seen and examined earlier this morning.  No major events overnight of this morning.  She complains of abdominal pain across upper abdomen.  Still with solid dysphagia.  She denies chest pain.  Breathing has improved.  She denies nausea or vomiting.  She denies UTI symptoms.  Objective: Vitals:   01/29/21 1724 01/29/21 1930 01/29/21 2138 01/30/21 0610  BP: (!) 150/53  (!) 137/55 (!) 126/52  Pulse: 76  65 60  Resp: '16  20 20  '$ Temp: 98.6 F (37 C)  (!) 97.5 F (36.4 C) 98.4 F (36.9 C)  TempSrc: Oral  Oral Oral  SpO2: 96%  95% 96%  Weight:  73.6 kg    Height:  '5\' 2"'$  (1.575 m)      Intake/Output Summary (Last 24 hours) at 01/30/2021 1408 Last data filed at 01/30/2021 0900 Gross per 24 hour  Intake 5 ml  Output 250 ml  Net -245 ml   Filed Weights   01/28/21 1801 01/29/21 1930  Weight: 73 kg 73.6 kg    Examination:  GENERAL: No apparent distress.  Nontoxic. HEENT: MMM.  Vision and hearing grossly intact.  NECK: Supple.  No apparent JVD.  RESP: 96% on 2 L.  No IWOB.  Diminished aeration of the lung bases. CVS:  RRR. Heart sounds normal.  ABD/GI/GU: BS+. Abd soft.  Some tenderness from recent surgery.  Surgical wound appears clean MSK/EXT:  Moves extremities. No apparent deformity. No  edema.  Right calf tenderness. SKIN: no apparent skin lesion or wound NEURO: Awake, alert and oriented appropriately.  No apparent focal neuro deficit. PSYCH: Calm. Normal affect.   Procedures:  None  Microbiology summarized: [id number] and influenza PCR nonreactive.  Assessment & Plan: Acute hypoxic respiratory failure-felt to be due to CHF exacerbation. -Wean oxygen as able -Treat CHF as below -IS/OOB/PT/OT/ambulatory saturation  Acute on chronic diastolic CHF: TTE with LVEF of 60 to 65%, indeterminate DD (G2 DD on previous TTE), RVSP of 31.1.  BNP 453.CTA chest negative for large PE but interstitial implant some vascular congestion.  Diuresed with IV Lasix.  I&O incomplete but net -2.3 L.  Renal function relatively stable.  Appears euvolemic on exam today.  She seems to be on regimen for systolic CHF but I do not see reduced EF on chart review. -P.o. Lasix 20 mg daily -Decrease losartan and Imdur.  Stop Aldactone and amlodipine. -Continue metoprolol XL -Closely monitor I&O, daily weights, renal functions and electrolytes.   Intra-abdominal fluid collection-CT abdomen shows organizing collection within the gallbladder fossa felt to be postsurgical seroma. -Consult Gen Surg for further evaluation    Anorexia/dysphagia/odynophagia/constipation-reports solid dysphagia after intubation for recent surgery.  She has not a bowel movement after surgery either. -MBS today +/- GI consult. -Bowel regimen -Consult dietitian -Check TSH in  the morning  Mild LFT elevation: Congestive hepatopathy.  Could be residual after recent cholecystectomy.  Improving. -Monitor  Hyponatremia: Could be due to Aldactone and losartan. -Adjusted meds as above.  Essential hypertension: Soft blood pressure this morning. -Cardiac meds as above   Uncontrolled NIDDM-2 with hyperglycemia: A1c .9%.7/20. Recent Labs  Lab 01/24/21 1143 01/25/21 1301 01/26/21 0743 01/26/21 1132  GLUCAP 157* 141* 219* 184*   -Start SSI-sensitive -NovoLog 3 units 3 times daily with meals  Normocytic anemia: H&H relatively stable. Recent Labs    08/22/20 1410 08/26/20 2159 10/27/20 1119 11/09/20 1216 11/24/20 1236 01/17/21 1134 01/26/21 0413 01/28/21 1923 01/29/21 0125 01/30/21 0417  HGB 13.0 12.9 13.9 12.6 13.1 13.4 11.6* 11.9* 11.6* 11.2*  -Check anemia panel in the morning  Coronary artery disease s/p remote stent -Cardiac meds as above. -continue statin  Restless leg syndrome -Resume home Mirapex  Generalized weakness: Lately started using walker. -PT/OT eval  Right calf tenderness-LE Doppler negative for DVT.   Thrombocytopenia: Relatively stable. -Monitor  Body mass index is 29.68 kg/m.         DVT prophylaxis:  enoxaparin (LOVENOX) injection 40 mg Start: 01/29/21 1000  Code Status: Full code Family Communication: Patient and/or RN. Updated patient's daughter Level of care: Telemetry Status is: Observation  The patient will require care spanning > 2 midnights and should be moved to inpatient because: Ongoing diagnostic testing needed not appropriate for outpatient work up, Unsafe d/c plan, and Inpatient level of care appropriate due to severity of illness  Dispo: The patient is from: Home              Anticipated d/c is to:  To be determined              Patient currently is not medically stable to d/c.   Difficult to place patient No       Consultants:  General surgery   Sch Meds:  Scheduled Meds:  aspirin EC  81 mg Oral Daily   cholecalciferol  2,000 Units Oral Daily   enoxaparin (LOVENOX) injection  40 mg Subcutaneous Q24H   furosemide  20 mg Oral Daily   insulin aspart  0-5 Units Subcutaneous QHS   insulin aspart  0-9 Units Subcutaneous TID WC   insulin aspart  2 Units Subcutaneous TID WC   isosorbide mononitrate  60 mg Oral QHS   [START ON 01/31/2021] losartan  50 mg Oral Daily   magnesium oxide  400 mg Oral BID   metoprolol succinate  75 mg Oral QHS    mirabegron ER  25 mg Oral Daily   pantoprazole  40 mg Oral Daily   polyethylene glycol  17 g Oral BID   potassium chloride SA  10 mEq Oral QPM   pramipexole  0.25 mg Oral BID   simvastatin  20 mg Oral QHS   sodium chloride flush  3 mL Intravenous Q12H   Continuous Infusions:  sodium chloride     PRN Meds:.sodium chloride, acetaminophen, famotidine, morphine injection, ondansetron (ZOFRAN) IV, ondansetron, senna-docusate, sodium chloride flush, sodium phosphate  Antimicrobials: Anti-infectives (From admission, onward)    None        I have personally reviewed the following labs and images: CBC: Recent Labs  Lab 01/26/21 0413 01/28/21 1923 01/29/21 0125 01/30/21 0417  WBC 13.7* 9.4 8.5 6.2  NEUTROABS  --  7.7  --   --   HGB 11.6* 11.9* 11.6* 11.2*  HCT 35.4* 36.0 35.0* 33.7*  MCV 92.9 94.0 92.8  93.4  PLT 133* 143* 139* 144*   BMP &GFR Recent Labs  Lab 01/26/21 0413 01/28/21 1923 01/29/21 0125 01/30/21 0417  NA 136 133* 132* 132*  K 4.7 5.1 4.0 3.7  CL 103 98 97* 95*  CO2 '23 25 25 28  '$ GLUCOSE 264* 129* 109* 124*  BUN 22 25* 21 22  CREATININE 1.05* 1.08* 1.08* 1.10*  CALCIUM 8.8* 9.0 8.8* 8.5*  MG 1.5*  --  1.8  --    Estimated Creatinine Clearance: 35.8 mL/min (A) (by C-G formula based on SCr of 1.1 mg/dL (H)). Liver & Pancreas: Recent Labs  Lab 01/28/21 1923 01/30/21 0417  AST 59* 33  ALT 80* 57*  ALKPHOS 134* 188*  BILITOT 1.2 0.9  PROT 6.6 5.7*  ALBUMIN 3.6 2.9*   Recent Labs  Lab 01/28/21 1923  LIPASE 25   No results for input(s): AMMONIA in the last 168 hours. Diabetic: No results for input(s): HGBA1C in the last 72 hours. Recent Labs  Lab 01/24/21 1143 01/25/21 1301 01/26/21 0743 01/26/21 1132  GLUCAP 157* 141* 219* 184*   Cardiac Enzymes: No results for input(s): CKTOTAL, CKMB, CKMBINDEX, TROPONINI in the last 168 hours. Recent Labs    08/19/20 1351  PROBNP 428   Coagulation Profile: No results for input(s): INR,  PROTIME in the last 168 hours. Thyroid Function Tests: No results for input(s): TSH, T4TOTAL, FREET4, T3FREE, THYROIDAB in the last 72 hours. Lipid Profile: No results for input(s): CHOL, HDL, LDLCALC, TRIG, CHOLHDL, LDLDIRECT in the last 72 hours. Anemia Panel: No results for input(s): VITAMINB12, FOLATE, FERRITIN, TIBC, IRON, RETICCTPCT in the last 72 hours. Urine analysis:    Component Value Date/Time   COLORURINE STRAW (A) 01/28/2021 2140   APPEARANCEUR CLEAR 01/28/2021 2140   LABSPEC 1.011 01/28/2021 2140   PHURINE 6.0 01/28/2021 2140   GLUCOSEU NEGATIVE 01/28/2021 2140   GLUCOSEU NEGATIVE 09/21/2016 1103   HGBUR NEGATIVE 01/28/2021 2140   BILIRUBINUR NEGATIVE 01/28/2021 2140   BILIRUBINUR neg 11/24/2020 [place] 01/28/2021 2140   PROTEINUR NEGATIVE 01/28/2021 2140   UROBILINOGEN 0.2 11/24/2020 1204   UROBILINOGEN 0.2 09/21/2016 1103   NITRITE NEGATIVE 01/28/2021 2140   LEUKOCYTESUR NEGATIVE 01/28/2021 2140   Sepsis Labs: Invalid input(s): PROCALCITONIN, [place]  Microbiology: Recent Results (from the past 240 hour(s))  SARS CORONAVIRUS 2 (TAT 6-24 HRS) Nasopharyngeal Nasopharyngeal Swab     Status: None   Collection Time: 01/24/21  2:02 PM   Specimen: Nasopharyngeal Swab  Result Value Ref Range Status   SARS Coronavirus 2 NEGATIVE NEGATIVE Final    Comment: (NOTE) SARS-CoV-2 target nucleic acids are NOT DETECTED.  The SARS-CoV-2 RNA is generally detectable in upper and lower respiratory specimens during the acute phase of infection. Negative results do not preclude SARS-CoV-2 infection, do not rule out co-infections with other pathogens, and should not be used as the sole basis for treatment or other patient management decisions. Negative results must be combined with clinical observations, patient history, and epidemiological information. The expected result is Negative.  Fact Sheet for  Patients: SugarRoll.be  Fact Sheet for Healthcare Providers: https://www.woods-mathews.com/  This test is not yet approved or cleared by the [place] FDA and  has been authorized for detection and/or diagnosis of SARS-CoV-2 by FDA under an Emergency Use Authorization (EUA). This EUA will remain  in effect (meaning this test can be used) for the duration of the COVID-19 declaration under Se ction 564(b)(1) of the Act, 21 U.S.C. section 360bbb-3(b)(1), unless the authorization  is terminated or revoked sooner.  Performed at [facility], [place] [address], [place], [place] [address]   Resp Panel by RT-PCR (Flu A&B, Covid) Nasopharyngeal Swab     Status: None   Collection Time: 01/28/21  7:23 PM   Specimen: Nasopharyngeal Swab; Nasopharyngeal(NP) swabs in vial transport medium  Result Value Ref Range Status   SARS Coronavirus 2 by RT PCR NEGATIVE NEGATIVE Final    Comment: (NOTE) SARS-CoV-2 target nucleic acids are NOT DETECTED.  The SARS-CoV-2 RNA is generally detectable in upper respiratory specimens during the acute phase of infection. The lowest concentration of SARS-CoV-2 viral copies this assay can detect is 138 copies/mL. A negative result does not preclude SARS-Cov-2 infection and should not be used as the sole basis for treatment or other patient management decisions. A negative result may occur with  improper specimen collection/handling, submission of specimen other than nasopharyngeal swab, presence of viral mutation(s) within the areas targeted by this assay, and inadequate number of viral copies(<138 copies/mL). A negative result must be combined with clinical observations, patient history, and epidemiological information. The expected result is Negative.  Fact Sheet for Patients:  EntrepreneurPulse.com.au  Fact Sheet for Healthcare Providers:  IncredibleEmployment.be  This test is  no t yet approved or cleared by the [place] FDA and  has been authorized for detection and/or diagnosis of SARS-CoV-2 by FDA under an Emergency Use Authorization (EUA). This EUA will remain  in effect (meaning this test can be used) for the duration of the COVID-19 declaration under Section 564(b)(1) of the Act, 21 U.S.C.section 360bbb-3(b)(1), unless the authorization is terminated  or revoked sooner.       Influenza A by PCR NEGATIVE NEGATIVE Final   Influenza B by PCR NEGATIVE NEGATIVE Final    Comment: (NOTE) The Xpert Xpress SARS-CoV-2/FLU/RSV plus assay is intended as an aid in the diagnosis of influenza from Nasopharyngeal swab specimens and should not be used as a sole basis for treatment. Nasal washings and aspirates are unacceptable for Xpert Xpress SARS-CoV-2/FLU/RSV testing.  Fact Sheet for Patients: EntrepreneurPulse.com.au  Fact Sheet for Healthcare Providers: IncredibleEmployment.be  This test is not yet approved or cleared by the [place] FDA and has been authorized for detection and/or diagnosis of SARS-CoV-2 by FDA under an Emergency Use Authorization (EUA). This EUA will remain in effect (meaning this test can be used) for the duration of the COVID-19 declaration under Section 564(b)(1) of the Act, 21 U.S.C. section 360bbb-3(b)(1), unless the authorization is terminated or revoked.  Performed at [facility], [place] [address], [place], [place] [address]     Radiology Studies: VAS [place] LOWER EXTREMITY VENOUS (DVT)  Result Date: 01/30/2021  Lower Venous DVT Study Patient Name:  AIVAH BANICKI  Date of Exam:   01/30/2021 Medical Rec #: [id number]       Accession #:    [id number] Date of Birth: 1935/05/10       Patient Gender: F Patient Age:   [id number] Exam Location:  [facility] Procedure:      VAS [place] LOWER EXTREMITY VENOUS (DVT) Referring Phys: [id number] MICHAEL M MACZIS  --------------------------------------------------------------------------------  Indications: Edema.  Risk Factors: None identified. Comparison Study: No prior studies. Performing Technologist: Oliver Hum RVT  Examination Guidelines: A complete evaluation includes B-mode imaging, spectral Doppler, color Doppler, and power Doppler as needed of all accessible portions of each vessel. Bilateral testing is considered an integral part of a complete examination. Limited examinations for reoccurring indications may be  performed as noted. The reflux portion of the exam is performed with the patient in reverse Trendelenburg.  +---------+---------------+---------+-----------+----------+--------------+ RIGHT    CompressibilityPhasicitySpontaneityPropertiesThrombus Aging +---------+---------------+---------+-----------+----------+--------------+ CFV      Full           Yes      Yes                                 +---------+---------------+---------+-----------+----------+--------------+ SFJ      Full                                                        +---------+---------------+---------+-----------+----------+--------------+ FV Prox  Full                                                        +---------+---------------+---------+-----------+----------+--------------+ FV Mid   Full                                                        +---------+---------------+---------+-----------+----------+--------------+ FV DistalFull                                                        +---------+---------------+---------+-----------+----------+--------------+ PFV      Full                                                        +---------+---------------+---------+-----------+----------+--------------+ POP      Full           Yes      Yes                                 +---------+---------------+---------+-----------+----------+--------------+ PTV      Full                                                         +---------+---------------+---------+-----------+----------+--------------+ PERO     Full                                                        +---------+---------------+---------+-----------+----------+--------------+   +---------+---------------+---------+-----------+----------+--------------+ LEFT     CompressibilityPhasicitySpontaneityPropertiesThrombus Aging +---------+---------------+---------+-----------+----------+--------------+ CFV      Full           Yes  Yes                                 +---------+---------------+---------+-----------+----------+--------------+ SFJ      Full                                                        +---------+---------------+---------+-----------+----------+--------------+ FV Prox  Full                                                        +---------+---------------+---------+-----------+----------+--------------+ FV Mid   Full                                                        +---------+---------------+---------+-----------+----------+--------------+ FV DistalFull                                                        +---------+---------------+---------+-----------+----------+--------------+ PFV      Full                                                        +---------+---------------+---------+-----------+----------+--------------+ POP      Full           Yes      Yes                                 +---------+---------------+---------+-----------+----------+--------------+ PTV      Full                                                        +---------+---------------+---------+-----------+----------+--------------+ PERO     Full                                                        +---------+---------------+---------+-----------+----------+--------------+     Summary: RIGHT: - There is no evidence of deep vein thrombosis in the  lower extremity.  - No cystic structure found in the popliteal fossa.  LEFT: - There is no evidence of deep vein thrombosis in the lower extremity.  - No cystic structure found in the popliteal fossa.  *See table(s) above for measurements and observations.    Preliminary         Ferry Matthis T. Mamye Bolds [facility]  If 7PM-7AM, please contact night-coverage [email or web address] 01/30/2021, 2:08 PM

## 2021-01-30 NOTE — Progress Notes (Signed)
Modified Barium Swallow Progress Note  Patient Details  Name: JWAN YELL MRN: [id number] Date of Birth: 09-26-1934  Today's Date: 01/30/2021  Modified Barium Swallow completed.  Full report located under Chart Review in the Imaging Section.  Brief recommendations include the following:  Clinical Impression  Patient presents with a mild oropharyngeal dysphagia and with a suspected structural component with observed osteophytes and cricopharyngeal bar (no radiologist present to confirm) which slowed down bolus transit but did not fully impede and only trace to mild pharyngeal residuals observed post initiail swallows. During oral phase, patient exhibited brief oral holding of thin liquids prior to swallow, decreased mastication of regular solids and piecemeal swallowing with puree solids and regular solids. Patient exhibited trace vallecular residuals after initial sips of thin liquids and trace to minimal vallecular residuals after initial bites of puree solids. Residuals all eventually cleared with subsequent swallows. She exhibited one instance of trace flash penetration of thin liquids via straw sips, but penetrate fully cleared. No aspiration observed and patient with good airway protection throughout. Barium tablet transit stopped briefly at approximately upper thoracic portion of esophagus when taken with plain, thin water, but puree solids (applesauce) helped to transit tablet fully. SLP spoke with patient and daughter after MBS to discuss nature of her swallow function, provide education regarding swallow safety. SLP recommended patient sit upright for meals (preferably in chair), fully masticate solids, drink liquids throughout solids intake to keep boluses transiting through esophagus, eat/drink smaller meals. Patient and daughter both verbalized understanding.   Swallow Evaluation Recommendations       SLP Diet Recommendations: Dysphagia 3 (Mech soft) solids;Thin liquid   Liquid  Administration via: Cup;Straw   Medication Administration: Whole meds with puree   Supervision: Patient able to self feed   Compensations: Slow rate;Small sips/bites   Postural Changes: Seated upright at 90 degrees   Oral Care Recommendations: Oral care BID       Sonia Baller, MA, CCC-SLP Speech Therapy

## 2021-01-30 NOTE — Progress Notes (Signed)
Bilateral lower extremity venous duplex has been completed. Preliminary results can be found in CV Proc through chart review.   01/30/21 10:25 AM Carlos Levering RVT

## 2021-01-30 NOTE — Progress Notes (Signed)
Patient states that she needs something to help her sleep. PCP on call was notified.

## 2021-01-30 NOTE — Progress Notes (Signed)
Subjective: CC: Patient reports sob and cough. She has pain at her incisions anytime she coughs. She reports she did not get out of bed yesterday or today. She is passing flatus but has not had a bm since 7/20. She reports she is tolerating cld without n/v but anything solid (including grits) feel like they get stuck when she is trying to swallow them. Having R calf pain.   Objective: Vital signs in last 24 hours: Temp:  [97.5 F (36.4 C)-98.6 F (37 C)] 98.4 F (36.9 C) (07/25 0610) Pulse Rate:  [60-76] 60 (07/25 0610) Resp:  [14-20] 20 (07/25 0610) BP: (99-155)/(52-92) 126/52 (07/25 0610) SpO2:  [92 %-96 %] 96 % (07/25 0610) Weight:  [73.6 kg] 73.6 kg (07/24 1930) Last BM Date: 01/25/21  Intake/Output from previous day: 07/24 0701 - 07/25 0700 In: -  Out: 250 [Urine:250] Intake/Output this shift: Total I/O In: 5 [P.O.:5] Out: -   PE: Gen:  Alert, NAD, pleasant HEENT: EOM's intact, pupils equal and round Card:  RRR Pulm: Distant breath sounds at the bases. Clear in upper lung fields. On o2. Normal rate and effort Abd: Soft, mild distension, appropriately tender around incision without peritonitis, +BS, laparoscopic incisions with glue intact appears well and are without drainage, bleeding, or signs of infection. Supraumbilical incision w/ small skin separation at the base without drainage or bleeding. There is scabbing noted over incision. No signs of infection. ? Ventral hernia superior to supraumbilical incision that is partially reducible Psych: A&Ox3  Ext: R calf tenderess with palpation. No ttp of the L calf. No LE edema. DP 2+.  Skin: no rashes noted, warm and dry  Lab Results:  Recent Labs    01/29/21 0125 01/30/21 0417  WBC 8.5 6.2  HGB 11.6* 11.2*  HCT 35.0* 33.7*  PLT 139* 144*   BMET Recent Labs    01/29/21 0125 01/30/21 0417  NA 132* 132*  K 4.0 3.7  CL 97* 95*  CO2 25 28  GLUCOSE 109* 124*  BUN 21 22  CREATININE 1.08* 1.10*  CALCIUM  8.8* 8.5*   PT/INR No results for input(s): LABPROT, INR in the last 72 hours. CMP     Component Value Date/Time   NA 132 (L) 01/30/2021 0417   NA 139 10/03/2020 1138   K 3.7 01/30/2021 0417   CL 95 (L) 01/30/2021 0417   CO2 28 01/30/2021 0417   GLUCOSE 124 (H) 01/30/2021 0417   BUN 22 01/30/2021 0417   BUN 23 10/03/2020 1138   CREATININE 1.10 (H) 01/30/2021 0417   CREATININE 0.95 11/10/2013 0328   CALCIUM 8.5 (L) 01/30/2021 0417   PROT 5.7 (L) 01/30/2021 0417   ALBUMIN 2.9 (L) 01/30/2021 0417   AST 33 01/30/2021 0417   ALT 57 (H) 01/30/2021 0417   ALKPHOS 188 (H) 01/30/2021 0417   BILITOT 0.9 01/30/2021 0417   GFRNONAA 50 (L) 01/30/2021 0417   GFRAA 57 (L) 08/19/2020 1351   Lipase     Component Value Date/Time   LIPASE 25 01/28/2021 1923    Studies/Results: DG Chest 2 View  Result Date: 01/28/2021 CLINICAL DATA:  Cough. Hypoxia. Abdominal pain, status post cholecystectomy and hernia repair 01/25/2021. EXAM: CHEST - 2 VIEW COMPARISON:  09/05/2020 FINDINGS: Mild blunting of both costophrenic angles. Linear opacities at the lung bases favoring subsegmental atelectasis. The patient is rotated to the right on today's radiograph, reducing diagnostic sensitivity and specificity. No appreciable pneumothorax. Mild thoracic kyphosis. Atherosclerotic calcification of the  aortic arch. IMPRESSION: 1. Mild bibasilar atelectasis with blunting of the costophrenic angle suggesting small bilateral pleural effusions. No edema. 2.  Aortic Atherosclerosis (ICD10-I70.0). Electronically Signed   By: Van Clines M.D.   On: 01/28/2021 18:30   CT Angio Chest PE W/Cm &/Or Wo Cm  Addendum Date: 01/28/2021   ADDENDUM REPORT: 01/28/2021 21:54 ADDENDUM: These results were called by telephone at the time of interpretation on 01/28/2021 at 9:54 pm to provider Benay Pike MD, who verbally acknowledged these results. Electronically Signed   By: Lovena Le M.D.   On: 01/28/2021 21:54   Result Date:  01/28/2021 CLINICAL DATA:  Laparoscopic cholecystectomy and conventional ventral hernia repair performed 01/25/2021, now with shortness of breath and hypoxia. EXAM: CT ANGIOGRAPHY CHEST CT ABDOMEN AND PELVIS WITH CONTRAST TECHNIQUE: Multidetector CT imaging of the chest was performed using the standard protocol during bolus administration of intravenous contrast. Multiplanar CT image reconstructions and MIPs were obtained to evaluate the vascular anatomy. Multidetector CT imaging of the abdomen and pelvis was performed using the standard protocol during bolus administration of intravenous contrast. CONTRAST:  69m OMNIPAQUE IOHEXOL 350 MG/ML SOLN COMPARISON:  CT abdomen pelvis 11/24/2020, cholangiogram 01/25/2021, CT chest 08/27/2020 FINDINGS: CTA CHEST FINDINGS Cardiovascular: Fall there is satisfactory opacification of the pulmonary arteries. Exam quality is diminished by extensive cardiac pulsation and respiratory motion artifact throughout the lungs. No large central or lobar filling defects are convincingly demonstrated. More distal evaluation is largely precluded by technical factors. Top-normal cardiac size. Three-vessel coronary artery atherosclerosis. No pericardial effusion. Cardiac pulsation artifact results in suboptimal assessment of the aortic root and ascending thoracic aorta. Mediastinum/Nodes: Few scattered small subcentimeter hypoattenuating nodules in the thyroid gland are unchanged from comparison prior and unlikely to be clinically significant. Thoracic inlet is otherwise unremarkable. Moderate hiatal hernia. Fluid-filled distal thoracic esophagus. Tracheal and central airways are unremarkable. No concerning mediastinal, hilar or axillary adenopathy. Lungs/Pleura: Small right trace left pleural effusions with some adjacent areas of passive atelectasis and additional dependent atelectatic features posteriorly. Some clustered nodules seen in the anterior lingula are new region of previously seen  consolidation could reflect some postinflammatory change. No other focal airspace opacities are seen. Mild interlobular septal thickening could reflect some mild interstitial edema. Musculoskeletal: Exaggerated thoracic kyphosis. Multilevel degenerative changes are present in the imaged portions of the spine. Additional degenerative changes in the bilateral shoulders. No acute or worrisome chest wall mass or lesion. Review of the MIP images confirms the above findings. CT ABDOMEN and PELVIS FINDINGS Hepatobiliary: Normal hepatic attenuation. No concerning focal liver lesion. Question some micro lobulations along the anterior left lobe liver. Postsurgical changes from cholecystectomy with few foci of free gas along the liver surface and in the gallbladder fossa with a slightly more organized collection towards the terminus of the gallbladder fossa (4/27) measuring approximately 3.3 x 2.2 x 1.9 cm in size. With some surrounding phlegmonous inflammation. Slight prominence of the biliary tree is nonspecific and post cholecystectomy state without visible calcified gallstone. Pancreas: No pancreatic ductal dilatation or surrounding inflammatory changes. Spleen: Normal in size. No concerning splenic lesions. Adrenals/Urinary Tract: Normal adrenal glands. Kidneys enhance and excrete symmetrically and uniformly. Suspect some mild bilateral renal atrophy. Areas of cortical scarring bilaterally. There is a 2 cm fluid attenuation cyst in the interpolar right kidney with a smaller 1.3 cm cyst in the lower pole (series 9, images 24, 15). No concerning renal mass. No urolithiasis or hydronephrosis. Urinary bladder is unremarkable for the degree of distention. Stomach/Bowel: Moderate hiatal  hernia. Distal stomach and duodenum are unremarkable. No small bowel thickening or dilatation. No evidence of bowel obstruction. Normal appendix in the right lower quadrant. Scattered colonic diverticula without focal inflammation to suggest  diverticulitis. No colonic dilatation or wall thickening. Vascular/Lymphatic: Atherosclerotic calcifications within the abdominal aorta and branch vessels. No aneurysm or ectasia. No pathologically enlarged abdominopelvic lymph nodes. Some reactive appearing adenopathy at the porta hepatis and in the mid mesentery. Reproductive: Uterus is surgically absent. No concerning adnexal lesions. Other: Postsurgical changes the anterior abdominal wall compatible with recent ventral hernia repair. Stranding of the omental fat anteriorly at the site of operation is nonspecific. A more superior bilobed ventral hernia remains similar to prior with some small volume of free fluid in the left lobulation, likely postsurgical in nature. Few foci of intraperitoneal gas extending along the liver surface as well as gas and fluid in the gallbladder fossa, likely postsurgical in nature though a more organized collection does raise some suspicion. Small amount of free fluid tracking towards the lower abdomen. Mild body wall edema. Musculoskeletal: Multilevel degenerative changes are present in the imaged portions of the spine. Interspinous arthrosis compatible with Baastrup's disease. Review of the MIP images confirms the above findings. IMPRESSION: CT angiography of the chest: 1. Limited assessment for pulmonary artery embolism given extensive respiratory motion and cardiac pulsation artifact. No large central or lobar filling defects are identified. 2. Mild interstitial pulmonary edema and developing bilateral pleural effusions, right greater than left with adjacent areas of passive atelectasis. 3. Some persistent centrilobular ground-glass nodularity seen in the anterior lingula in a region of previously seen consolidation, could reflect some post infectious or inflammatory sequela. 4. Moderate hiatal hernia with fluid-filled distal thoracic esophagus, recommend correlation for aspiration risk. 5. Three-vessel coronary artery  atherosclerosis. 6.  Aortic Atherosclerosis (ICD10-I70.0). CT abdomen and pelvis: 1. Postsurgical changes from recent laparoscopic cholecystectomy as well as conventional incisional hernia repair. 2. A small possibly organizing collection seen within the gallbladder fossa of admixed air and low to intermediate attenuation fluid with some questionable rim enhancement, could reflect a postoperative biloma, hematoma or seroma. Superinfection cannot be fully excluded. 3. Some expected postsurgical changes along the anterior abdominal wall at the site of a paraumbilical incisional hernia repair including soft tissue gas along the anterior abdominal wall and mild stranding along the omental fat adjacent the operative site. 4. A more superior bilobed fat containing ventral hernia rim remains, grossly similar to comparison prior albeit with some stranding and free fluid in the hernia contents which can be expected postsurgically. 5. Diverticulosis without evidence of acute diverticulitis. 6.  Aortic Atherosclerosis (ICD10-I70.0). Electronically Signed: By: Lovena Le M.D. On: 01/28/2021 21:46   CT Abdomen Pelvis W Contrast  Addendum Date: 01/28/2021   ADDENDUM REPORT: 01/28/2021 21:54 ADDENDUM: These results were called by telephone at the time of interpretation on 01/28/2021 at 9:54 pm to provider Benay Pike MD, who verbally acknowledged these results. Electronically Signed   By: Lovena Le M.D.   On: 01/28/2021 21:54   Result Date: 01/28/2021 CLINICAL DATA:  Laparoscopic cholecystectomy and conventional ventral hernia repair performed 01/25/2021, now with shortness of breath and hypoxia. EXAM: CT ANGIOGRAPHY CHEST CT ABDOMEN AND PELVIS WITH CONTRAST TECHNIQUE: Multidetector CT imaging of the chest was performed using the standard protocol during bolus administration of intravenous contrast. Multiplanar CT image reconstructions and MIPs were obtained to evaluate the vascular anatomy. Multidetector CT imaging of the  abdomen and pelvis was performed using the standard protocol during bolus administration  of intravenous contrast. CONTRAST:  3m OMNIPAQUE IOHEXOL 350 MG/ML SOLN COMPARISON:  CT abdomen pelvis 11/24/2020, cholangiogram 01/25/2021, CT chest 08/27/2020 FINDINGS: CTA CHEST FINDINGS Cardiovascular: Fall there is satisfactory opacification of the pulmonary arteries. Exam quality is diminished by extensive cardiac pulsation and respiratory motion artifact throughout the lungs. No large central or lobar filling defects are convincingly demonstrated. More distal evaluation is largely precluded by technical factors. Top-normal cardiac size. Three-vessel coronary artery atherosclerosis. No pericardial effusion. Cardiac pulsation artifact results in suboptimal assessment of the aortic root and ascending thoracic aorta. Mediastinum/Nodes: Few scattered small subcentimeter hypoattenuating nodules in the thyroid gland are unchanged from comparison prior and unlikely to be clinically significant. Thoracic inlet is otherwise unremarkable. Moderate hiatal hernia. Fluid-filled distal thoracic esophagus. Tracheal and central airways are unremarkable. No concerning mediastinal, hilar or axillary adenopathy. Lungs/Pleura: Small right trace left pleural effusions with some adjacent areas of passive atelectasis and additional dependent atelectatic features posteriorly. Some clustered nodules seen in the anterior lingula are new region of previously seen consolidation could reflect some postinflammatory change. No other focal airspace opacities are seen. Mild interlobular septal thickening could reflect some mild interstitial edema. Musculoskeletal: Exaggerated thoracic kyphosis. Multilevel degenerative changes are present in the imaged portions of the spine. Additional degenerative changes in the bilateral shoulders. No acute or worrisome chest wall mass or lesion. Review of the MIP images confirms the above findings. CT ABDOMEN and  PELVIS FINDINGS Hepatobiliary: Normal hepatic attenuation. No concerning focal liver lesion. Question some micro lobulations along the anterior left lobe liver. Postsurgical changes from cholecystectomy with few foci of free gas along the liver surface and in the gallbladder fossa with a slightly more organized collection towards the terminus of the gallbladder fossa (4/27) measuring approximately 3.3 x 2.2 x 1.9 cm in size. With some surrounding phlegmonous inflammation. Slight prominence of the biliary tree is nonspecific and post cholecystectomy state without visible calcified gallstone. Pancreas: No pancreatic ductal dilatation or surrounding inflammatory changes. Spleen: Normal in size. No concerning splenic lesions. Adrenals/Urinary Tract: Normal adrenal glands. Kidneys enhance and excrete symmetrically and uniformly. Suspect some mild bilateral renal atrophy. Areas of cortical scarring bilaterally. There is a 2 cm fluid attenuation cyst in the interpolar right kidney with a smaller 1.3 cm cyst in the lower pole (series 9, images 24, 15). No concerning renal mass. No urolithiasis or hydronephrosis. Urinary bladder is unremarkable for the degree of distention. Stomach/Bowel: Moderate hiatal hernia. Distal stomach and duodenum are unremarkable. No small bowel thickening or dilatation. No evidence of bowel obstruction. Normal appendix in the right lower quadrant. Scattered colonic diverticula without focal inflammation to suggest diverticulitis. No colonic dilatation or wall thickening. Vascular/Lymphatic: Atherosclerotic calcifications within the abdominal aorta and branch vessels. No aneurysm or ectasia. No pathologically enlarged abdominopelvic lymph nodes. Some reactive appearing adenopathy at the porta hepatis and in the mid mesentery. Reproductive: Uterus is surgically absent. No concerning adnexal lesions. Other: Postsurgical changes the anterior abdominal wall compatible with recent ventral hernia repair.  Stranding of the omental fat anteriorly at the site of operation is nonspecific. A more superior bilobed ventral hernia remains similar to prior with some small volume of free fluid in the left lobulation, likely postsurgical in nature. Few foci of intraperitoneal gas extending along the liver surface as well as gas and fluid in the gallbladder fossa, likely postsurgical in nature though a more organized collection does raise some suspicion. Small amount of free fluid tracking towards the lower abdomen. Mild body wall edema. Musculoskeletal: Multilevel  degenerative changes are present in the imaged portions of the spine. Interspinous arthrosis compatible with Baastrup's disease. Review of the MIP images confirms the above findings. IMPRESSION: CT angiography of the chest: 1. Limited assessment for pulmonary artery embolism given extensive respiratory motion and cardiac pulsation artifact. No large central or lobar filling defects are identified. 2. Mild interstitial pulmonary edema and developing bilateral pleural effusions, right greater than left with adjacent areas of passive atelectasis. 3. Some persistent centrilobular ground-glass nodularity seen in the anterior lingula in a region of previously seen consolidation, could reflect some post infectious or inflammatory sequela. 4. Moderate hiatal hernia with fluid-filled distal thoracic esophagus, recommend correlation for aspiration risk. 5. Three-vessel coronary artery atherosclerosis. 6.  Aortic Atherosclerosis (ICD10-I70.0). CT abdomen and pelvis: 1. Postsurgical changes from recent laparoscopic cholecystectomy as well as conventional incisional hernia repair. 2. A small possibly organizing collection seen within the gallbladder fossa of admixed air and low to intermediate attenuation fluid with some questionable rim enhancement, could reflect a postoperative biloma, hematoma or seroma. Superinfection cannot be fully excluded. 3. Some expected postsurgical  changes along the anterior abdominal wall at the site of a paraumbilical incisional hernia repair including soft tissue gas along the anterior abdominal wall and mild stranding along the omental fat adjacent the operative site. 4. A more superior bilobed fat containing ventral hernia rim remains, grossly similar to comparison prior albeit with some stranding and free fluid in the hernia contents which can be expected postsurgically. 5. Diverticulosis without evidence of acute diverticulitis. 6.  Aortic Atherosclerosis (ICD10-I70.0). Electronically Signed: By: Lovena Le M.D. On: 01/28/2021 21:46   ECHOCARDIOGRAM COMPLETE  Result Date: 01/29/2021    ECHOCARDIOGRAM REPORT   Patient Name:   Heather [name] Date of Exam: 01/29/2021 Medical Rec #:  [id number]      Height:       62.0 in Accession #:    [id number]     Weight:       160.9 lb Date of Birth:  10-09-1934      BSA:          1.743 m Patient Age:    40 years       BP:           144/57 mmHg Patient Gender: F              HR:           71 bpm. Exam Location:  Inpatient Procedure: 2D Echo, Cardiac Doppler and Color Doppler Indications:    CHF-Acute Diastolic X52.84  History:        Patient has prior history of Echocardiogram examinations, most                 recent 08/26/2020. CAD, TIA, Arrythmias:RBBB; Risk                 Factors:Diabetes, Hypertension and Dyslipidemia.  Sonographer:    Bernadene Person RDCS Referring Phys: [phone] JARED M GARDNER  Sonographer Comments: Image acquisition challenging due to respiratory motion. IMPRESSIONS  1. Left ventricular ejection fraction, by estimation, is 60 to 65%. The left ventricle has normal function. The left ventricle has no regional wall motion abnormalities. Left ventricular diastolic parameters are indeterminate.  2. Right ventricular systolic function is normal. The right ventricular size is normal. There is normal pulmonary artery systolic pressure. The estimated right ventricular systolic pressure is 40.1 mmHg.  3.  The mitral valve is normal in structure. Trivial mitral valve regurgitation. No evidence of  mitral stenosis.  4. The aortic valve is tricuspid. Aortic valve regurgitation is not visualized. Mild to moderate aortic valve sclerosis/calcification is present, without any evidence of aortic stenosis.  5. The inferior vena cava is normal in size with greater than 50% respiratory variability, suggesting right atrial pressure of 3 mmHg. FINDINGS  Left Ventricle: Left ventricular ejection fraction, by estimation, is 60 to 65%. The left ventricle has normal function. The left ventricle has no regional wall motion abnormalities. The left ventricular internal cavity size was normal in size. There is  no left ventricular hypertrophy. Left ventricular diastolic parameters are indeterminate. Normal left ventricular filling pressure. Right Ventricle: The right ventricular size is normal. No increase in right ventricular wall thickness. Right ventricular systolic function is normal. There is normal pulmonary artery systolic pressure. The tricuspid regurgitant velocity is 2.65 m/s, and  with an assumed right atrial pressure of 3 mmHg, the estimated right ventricular systolic pressure is 95.2 mmHg. Left Atrium: Left atrial size was normal in size. Right Atrium: Right atrial size was normal in size. Pericardium: There is no evidence of pericardial effusion. Mitral Valve: The mitral valve is normal in structure. Mild mitral annular calcification. Trivial mitral valve regurgitation. No evidence of mitral valve stenosis. Tricuspid Valve: The tricuspid valve is normal in structure. Tricuspid valve regurgitation is trivial. No evidence of tricuspid stenosis. Aortic Valve: The aortic valve is tricuspid. Aortic valve regurgitation is not visualized. Mild to moderate aortic valve sclerosis/calcification is present, without any evidence of aortic stenosis. Pulmonic Valve: The pulmonic valve was normal in structure. Pulmonic valve regurgitation  is not visualized. No evidence of pulmonic stenosis. Aorta: The aortic root is normal in size and structure. Venous: The inferior vena cava is normal in size with greater than 50% respiratory variability, suggesting right atrial pressure of 3 mmHg. IAS/Shunts: No atrial level shunt detected by color flow Doppler.  LEFT VENTRICLE PLAX 2D LVIDd:         3.00 cm  Diastology LVIDs:         2.10 cm  LV e' medial:    6.60 cm/s LV PW:         0.90 cm  LV E/e' medial:  14.8 LV IVS:        1.10 cm  LV e' lateral:   9.00 cm/s LVOT diam:     1.90 cm  LV E/e' lateral: 10.9 LV SV:         67 LV SV Index:   38 LVOT Area:     2.84 cm  RIGHT VENTRICLE RV S prime:     12.70 cm/s TAPSE (M-mode): 2.0 cm LEFT ATRIUM             Index       RIGHT ATRIUM           Index LA diam:        3.10 cm 1.78 cm/m  RA Area:     11.60 cm LA Vol (A2C):   52.5 ml 30.12 ml/m RA Volume:   24.70 ml  14.17 ml/m LA Vol (A4C):   47.0 ml 26.96 ml/m LA Biplane Vol: 51.4 ml 29.49 ml/m  AORTIC VALVE LVOT Vmax:   116.00 cm/s LVOT Vmean:  79.600 cm/s LVOT VTI:    0.236 m  AORTA Ao Root diam: 2.80 cm Ao Asc diam:  3.20 cm MITRAL VALVE               TRICUSPID VALVE MV Area (PHT): 3.03 cm  TR Peak grad:   28.1 mmHg MV Decel Time: 250 msec    TR Vmax:        265.00 cm/s MV E velocity: 97.90 cm/s MV A velocity: 94.10 cm/s  SHUNTS MV E/A ratio:  1.04        Systemic VTI:  0.24 m                            Systemic Diam: 1.90 cm Fransico Him MD Electronically signed by Fransico Him MD Signature Date/Time: 01/29/2021/10:38:04 AM    Final     Anti-infectives: Anti-infectives (From admission, onward)    None        Assessment/Plan POD 5 s/p Laparoscopic cholecystectomy with IOC, Reduction and primary repair of incarcerated incisional hernias - Dr. Johney Maine - 01/30/2021 - IOC negative. LFT's down from yesterday except alk phos. Cont to trend. - CT 7/23 w/ fluid in GB fossa. Cannot r/o infection but suspect post-operative changes given normal wbc.  Monitor - Ventral hernia on CT noted containing fat. Appears she had swiss cheese deformity per Dr. Clyda Greener op note that was repaired primarily. No incarcerated bowel noted on CT. No indication for emergency surgery. - Dysphagia - SLP consult - RLE pain - [place] to r/o dvt - We will follow with you  FEN - FLD, suppository and bowel regimen VTE - SCDs, Lovenox ID - None indicated  Dysphagia - SLP consult  RLE pain - check LE [place]. Hx of DVT in the past. No PE on CTA CHF w/ pulm edema, b/l pleural effusions and elevated BNP HTN DM2 CKD   LOS: 0 days    Jillyn Ledger , [facility] Surgery 01/30/2021, 9:14 AM Please see Amion for pager number during day hours 7:00am-4:30pm

## 2021-01-30 NOTE — Plan of Care (Signed)
  Problem: Education: Goal: Knowledge of General Education information will improve Description: Including pain rating scale, medication(s)/side effects and non-pharmacologic comfort measures Outcome: Progressing   Problem: Clinical Measurements: Goal: Ability to maintain clinical measurements within normal limits will improve Outcome: Progressing Goal: Respiratory complications will improve Outcome: Progressing   Problem: Activity: Goal: Risk for activity intolerance will decrease Outcome: Progressing   

## 2021-01-30 NOTE — Progress Notes (Signed)
SATURATION QUALIFICATIONS: (This note is used to comply with regulatory documentation for home oxygen)  Patient Saturations on Room Air at Rest = 93%  Patient Saturations on Room Air while Ambulating = 88%  Patient Saturations on 2 Liters of oxygen while Ambulating = 96%  Please briefly explain why patient needs home oxygen: Pt desat 88% w/o O2

## 2021-01-30 NOTE — Progress Notes (Signed)
OT Cancellation Note  Patient Details Name: Heather [name] MRN: [id number] DOB: 1934/08/12   Cancelled Treatment:    Reason Eval/Treat Not Completed: Patient at procedure or test/ unavailable. Pt off floor for swallow study. Will continue efforts.   Julien Girt 01/30/2021, 1:55 PM

## 2021-01-30 NOTE — Consult Note (Signed)
[facility] Nurse wound consult note Consultation was completed by review of records, images and assistance from the bedside nurse/clinical staff.  Reason for Consult: surgical wounds Lap chol/repair of incarcerated incisional hernia 01/25/21.   Wound type:surgical wounds Reported to be dry and scabbed, no topical care needed  Re consult if needed, will not follow at this time. Thanks  Dearion Huot [employer], RN,CWOCN, CNS, [place] [phone])

## 2021-01-30 NOTE — Evaluation (Signed)
Clinical/Bedside Swallow Evaluation Patient Details  Name: Heather [name] MRN: [id number] Date of Birth: Jun 27, 1935  Today's Date: 01/30/2021 Time: SLP Start Time (ACUTE ONLY): 0845 SLP Stop Time (ACUTE ONLY): 0855 SLP Time Calculation (min) (ACUTE ONLY): 10 min  Past Medical History:  Past Medical History:  Diagnosis Date   Chronic stable angina ([place])    Coronary artery disease cardiologist--- dr Burt Knack   hx NSTEMI  w/ cardiac cath 03-16-2005 PCI with DES to LCx;   cardiac cath 06-27-2005  PCI with DES to RCA with residual dx LAD manage medcially;  lexiscan 01-26-20211 normal no ischemia, ef 70%;  stress echo w/ dobutamine 10-24-2011 negative ishcmeia , normal ef // Myoview 2/22: EF 67, no ischemia or infarction, no TID, low risk    Diabetes mellitus type 2, diet-controlled ([place])    followed by pcp  (10-19-2020  pt stated checks daily in am,  fasting blood surgar--- 115--120s)   DOE (dyspnea on exertion)    per pt "when I over do",  ok with household chores   Echocardiogram 08/2020    Echocardiogram 2/22: EF 55-60, no RWMA, mild LVH, Gr 2 DD, GLS-21.7%, normal RVSF, trivial MR, RVSP 39.5   Edema of right lower extremity    GERD (gastroesophageal reflux disease)    Hiatal hernia    recurrence,  hx HH repair 1989   History of cervical cancer    s/p  vaginal hysterectomy   History of DVT of lower extremity 2016   11-29-2014 post op right TKA of right lower extremity and completed xarelto    History of esophageal stricture    hx s/p dilatation's   History of gastric ulcer 2005 approx.   History of palpitations 2010   event monitor 07-07-2009 showed NSR w/ freq. SVT ectopies with short runs, rare PVCs   History of TIA (transient ischemic attack) 06/1999   12-15-2019  per pt had several TIA between 12/ 2000 to 02/ 2001 , was sent to specialist '@[facility] , had test that was normal (10-19-2020 pt stated no TIAs since ) but has residual of essential tremors of right arm/ hand   Hypertension     Intermittent palpitations    IT band syndrome    Migraine    "ice pick headche lasts about 30 seconds"   Mixed hyperlipidemia    Mixed incontinence urge and stress    Multiple thyroid nodules    followed by pcp---   ultrasound 11-22-2014 no bx   (12-15-2019 per pt had a endocrinologist and was told did not need bx)   OA (osteoarthritis)    knees, elbow, hip, ankles   Occasional tremors    right arm/ hand  s/p TIA residual 2000   Osteoporosis    taking vitamin d   Peroneal DVT (deep venous thrombosis) ([place]) 12/22/2014   Right bundle branch block (RBBB) with left anterior fascicular block (LAFB)    RLS (restless legs syndrome)    S/P drug eluting coronary stent placement 2006   03-16-2005  PCI x1 DES to LCx;   06-27-2005  PCI x1 DES to RCA   Urinary retention    post op sling prodecure on 10-27-2020, has foley cathether   Past Surgical History:  Past Surgical History:  Procedure Laterality Date   ANTERIOR AND POSTERIOR REPAIR N/A 12/22/2019   Procedure: ANTERIOR (CYSTOCELE)  REPAIR;  Surgeon: Janyth Contes, MD;  Location: [facility];  Service: Gynecology;  Laterality: N/A;   ANTERIOR AND POSTERIOR REPAIR WITH SACROSPINOUS FIXATION N/A  10/27/2020   Procedure: SACROSPINOUS LIGAMENT FIXATION;  Surgeon: Jaquita Folds, MD;  Location[facility];  Service: Gynecology;  Laterality: N/A;   BLADDER SUSPENSION N/A 10/27/2020   Procedure: TRANSVAGINAL TAPE (TVT) PROCEDURE;  Surgeon: Jaquita Folds, MD;  Location: [facility];  Service: Gynecology;  Laterality: N/A;   CATARACT EXTRACTION W/ INTRAOCULAR LENS  IMPLANT, BILATERAL  2015   CHOLECYSTECTOMY N/A 01/25/2021   Procedure: LAPAROSCOPIC CHOLECYSTECTOMY WITH INTRAOPERATIVE CHOLANGIOGRAM AND LYSIS OF ADHESIONS;  Surgeon: Michael Boston, MD;  Location: [facility];  Service: General;  Laterality: N/A;   COLONOSCOPY  last one ?   CORONARY ANGIOPLASTY WITH STENT PLACEMENT  03-16-2005   dr  Lia Foyer   PCI and DES x1 to LCx   CORONARY ANGIOPLASTY WITH STENT PLACEMENT  06-27-2005  dr Lia Foyer   PCI and DES x1 to RCA with residual disease LAD 70-80% to manage medically   CYSTOSCOPY N/A 10/27/2020   Procedure: CYSTOSCOPY;  Surgeon: Jaquita Folds, MD;  Location: [facility];  Service: Gynecology;  Laterality: N/A;   CYSTOSCOPY N/A 11/09/2020   Procedure: CYSTOSCOPY;  Surgeon: Jaquita Folds, MD;  Location: [facility];  Service: Gynecology;  Laterality: N/A;   FOOT SURGERY Left 1990s   left foot stress fracture repair, per pt no hardware   HIATAL HERNIA REPAIR  1989   INCISIONAL HERNIA REPAIR N/A 01/25/2021   Procedure: PRIMARY REPAIR OF INCISIONAL HERNIA;  Surgeon: Michael Boston, MD;  Location: [facility];  Service: General;  Laterality: N/A;   KNEE ARTHROSCOPY Bilateral right ?/   left x2 , last one 09-12-2009 @ [place]   PUBOVAGINAL SLING N/A 11/09/2020   Procedure: [place];  Surgeon: Jaquita Folds, MD;  Location: [facility];  Service: Gynecology;  Laterality: N/A;   RECTOCELE REPAIR N/A 04/20/2020   Procedure: POSTERIOR REPAIR (RECTOCELE);  Surgeon: Janyth Contes, MD;  Location: [facility];  Service: Gynecology;  Laterality: N/A;   RECTOCELE REPAIR N/A 10/27/2020   Procedure: POSTERIOR REPAIR (RECTOCELE);  Surgeon: Jaquita Folds, MD;  Location: [facility];  Service: Gynecology;  Laterality: N/A;  total time requested for all procedures is 2 hours   TOTAL KNEE ARTHROPLASTY  11/12/2011   Procedure: TOTAL KNEE ARTHROPLASTY;  Surgeon: Lorn Junes, MD;  Location: [place];  Service: Orthopedics;  Laterality: Left;  Dr Noemi Chapel wants 90 minutes for this case   TOTAL KNEE ARTHROPLASTY Right 11/29/2014   Procedure: RIGHT TOTAL KNEE ARTHROPLASTY;  Surgeon: Vickey Huger, MD;  Location: [facility];  Service: Orthopedics;  Laterality: Right;   UPPER GASTROINTESTINAL ENDOSCOPY   last one 04-25-2017   with dilatation esophageal stricture and savary dilatation   VAGINAL HYSTERECTOMY  1988    no ovaries removed for bleeeding   HPI:  Patient is an 86 y.o. female with PMH: DM-2, HTN, prior DVT post knee replacement, HFpEF (grade 2 DD in February 2022) who underwent a lap chole + hernia repair surgery on 7/20 and discharged 7/21. She returned to ED on 7/23 with c/o SOB with tightness, hypoxia, abdominal soreness, no BM since surgery, no nausea or vomiting. She is also c/o feeling of solid foods getting stuck when trying to swallow. CTA chest/abd/pelvis revealed mild pulmonary edema, fluid collection in gall bladder fossa, no large central PE. She was placed on a full liquids diet and SLP was ordered to assess swallow function.   Assessment / Plan / Recommendation Clinical Impression  Patient presents with an oral phase of swallow that appears WFL-WNL but based on patient's complaints, appears with questionable pharyngoesophageal dysphagia. Patitent c/o bits of grits stuck in throat secondary to the grainy texture, but no c/o swallow difficulty and no observed oropharygneal dysphagia with smooth puree solids or thin liquids. SLP in agreement with MD to proceed with MBS to objectively assess patient's swallow function. SLP Visit Diagnosis: Dysphagia, unspecified (R13.10)    Aspiration Risk  Mild aspiration risk    Diet Recommendation Thin liquid (continue full liquids)   Liquid Administration via: Cup;Straw Medication Administration: Crushed with puree Supervision: Patient able to self feed Compensations: Slow rate;Small sips/bites Postural Changes: Seated upright at 90 degrees    Other  Recommendations Oral Care Recommendations: Oral care BID   Follow up Recommendations Other (comment) (TBD pending MBS)      Frequency and Duration min 1 x/week  1 week       Prognosis Prognosis for Safe Diet Advancement: Good      Swallow Study   General Date of Onset:  01/28/21 HPI: Patient is an 86 y.o. female with PMH: DM-2, HTN, prior DVT post knee replacement, HFpEF (grade 2 DD in February 2022) who underwent a lap chole + hernia repair surgery on 7/20 and discharged 7/21. She returned to ED on 7/23 with c/o SOB with tightness, hypoxia, abdominal soreness, no BM since surgery, no nausea or vomiting. She is also c/o feeling of solid foods getting stuck when trying to swallow. CTA chest/abd/pelvis revealed mild pulmonary edema, fluid collection in gall bladder fossa, no large central PE. She was placed on a full liquids diet and SLP was ordered to assess swallow function. Type of Study: Bedside Swallow Evaluation Previous Swallow Assessment: none Diet Prior to this Study: Thin liquids;Other (Comment) (full liquids) Temperature Spikes Noted: No Respiratory Status: Room air History of Recent Intubation: No Behavior/Cognition: Alert;Cooperative;Pleasant mood Oral Cavity Assessment: Within Functional Limits Oral Care Completed by SLP: No Oral Cavity - Dentition: Adequate natural dentition Vision: Functional for self-feeding Self-Feeding Abilities: Able to feed self Patient Positioning: Upright in bed Baseline Vocal Quality: Normal Volitional Cough: Strong Volitional Swallow: Able to elicit    Oral/Motor/Sensory Function Overall Oral Motor/Sensory Function: Within functional limits   Ice Chips     Thin Liquid Thin Liquid: Within functional limits    Nectar Thick     Honey Thick     Puree Puree: Impaired Presentation: Self Fed Pharyngeal Phase Impairments: Other (comments) Other Comments: Patient c/o soreness and difficulty swallowing grits because of the grainy texture; applesauce and other smooth purees are transiting without difficulty.   Solid     Solid: Not tested     Sonia Baller, MA, CCC-SLP Speech Therapy

## 2021-01-31 ENCOUNTER — Telehealth: Payer: Self-pay | Admitting: Family Medicine

## 2021-01-31 ENCOUNTER — Observation Stay (HOSPITAL_COMMUNITY): Payer: Medicare HMO

## 2021-01-31 DIAGNOSIS — I130 Hypertensive heart and chronic kidney disease with heart failure and stage 1 through stage 4 chronic kidney disease, or unspecified chronic kidney disease: Secondary | ICD-10-CM | POA: Diagnosis present

## 2021-01-31 DIAGNOSIS — K224 Dyskinesia of esophagus: Secondary | ICD-10-CM | POA: Diagnosis not present

## 2021-01-31 DIAGNOSIS — G8918 Other acute postprocedural pain: Secondary | ICD-10-CM | POA: Diagnosis present

## 2021-01-31 DIAGNOSIS — N3001 Acute cystitis with hematuria: Secondary | ICD-10-CM

## 2021-01-31 DIAGNOSIS — K761 Chronic passive congestion of liver: Secondary | ICD-10-CM | POA: Diagnosis present

## 2021-01-31 DIAGNOSIS — N179 Acute kidney failure, unspecified: Secondary | ICD-10-CM | POA: Diagnosis present

## 2021-01-31 DIAGNOSIS — Z20822 Contact with and (suspected) exposure to covid-19: Secondary | ICD-10-CM | POA: Diagnosis present

## 2021-01-31 DIAGNOSIS — R1312 Dysphagia, oropharyngeal phase: Secondary | ICD-10-CM

## 2021-01-31 DIAGNOSIS — E782 Mixed hyperlipidemia: Secondary | ICD-10-CM | POA: Diagnosis present

## 2021-01-31 DIAGNOSIS — N390 Urinary tract infection, site not specified: Secondary | ICD-10-CM | POA: Diagnosis present

## 2021-01-31 DIAGNOSIS — I10 Essential (primary) hypertension: Secondary | ICD-10-CM

## 2021-01-31 DIAGNOSIS — K219 Gastro-esophageal reflux disease without esophagitis: Secondary | ICD-10-CM | POA: Diagnosis present

## 2021-01-31 DIAGNOSIS — Z955 Presence of coronary angioplasty implant and graft: Secondary | ICD-10-CM | POA: Diagnosis not present

## 2021-01-31 DIAGNOSIS — R1310 Dysphagia, unspecified: Secondary | ICD-10-CM | POA: Diagnosis not present

## 2021-01-31 DIAGNOSIS — E1121 Type 2 diabetes mellitus with diabetic nephropathy: Secondary | ICD-10-CM

## 2021-01-31 DIAGNOSIS — Z8541 Personal history of malignant neoplasm of cervix uteri: Secondary | ICD-10-CM | POA: Diagnosis not present

## 2021-01-31 DIAGNOSIS — Z8673 Personal history of transient ischemic attack (TIA), and cerebral infarction without residual deficits: Secondary | ICD-10-CM | POA: Diagnosis not present

## 2021-01-31 DIAGNOSIS — K5900 Constipation, unspecified: Secondary | ICD-10-CM | POA: Diagnosis present

## 2021-01-31 DIAGNOSIS — J9601 Acute respiratory failure with hypoxia: Secondary | ICD-10-CM | POA: Diagnosis present

## 2021-01-31 DIAGNOSIS — G2581 Restless legs syndrome: Secondary | ICD-10-CM | POA: Diagnosis present

## 2021-01-31 DIAGNOSIS — R531 Weakness: Secondary | ICD-10-CM

## 2021-01-31 DIAGNOSIS — I2510 Atherosclerotic heart disease of native coronary artery without angina pectoris: Secondary | ICD-10-CM | POA: Diagnosis present

## 2021-01-31 DIAGNOSIS — E1122 Type 2 diabetes mellitus with diabetic chronic kidney disease: Secondary | ICD-10-CM | POA: Diagnosis present

## 2021-01-31 DIAGNOSIS — Z86718 Personal history of other venous thrombosis and embolism: Secondary | ICD-10-CM | POA: Diagnosis not present

## 2021-01-31 DIAGNOSIS — E1165 Type 2 diabetes mellitus with hyperglycemia: Secondary | ICD-10-CM | POA: Diagnosis present

## 2021-01-31 DIAGNOSIS — R630 Anorexia: Secondary | ICD-10-CM | POA: Diagnosis present

## 2021-01-31 DIAGNOSIS — Z6829 Body mass index (BMI) 29.0-29.9, adult: Secondary | ICD-10-CM | POA: Diagnosis not present

## 2021-01-31 DIAGNOSIS — I5033 Acute on chronic diastolic (congestive) heart failure: Secondary | ICD-10-CM | POA: Diagnosis present

## 2021-01-31 DIAGNOSIS — R188 Other ascites: Secondary | ICD-10-CM

## 2021-01-31 DIAGNOSIS — B9689 Other specified bacterial agents as the cause of diseases classified elsewhere: Secondary | ICD-10-CM | POA: Diagnosis present

## 2021-01-31 DIAGNOSIS — E871 Hypo-osmolality and hyponatremia: Secondary | ICD-10-CM | POA: Diagnosis not present

## 2021-01-31 DIAGNOSIS — N189 Chronic kidney disease, unspecified: Secondary | ICD-10-CM | POA: Diagnosis present

## 2021-01-31 LAB — COMPREHENSIVE METABOLIC PANEL
ALT: 42 U/L (ref 0–44)
AST: 20 U/L (ref 15–41)
Albumin: 3 g/dL — ABNORMAL LOW (ref 3.5–5.0)
Alkaline Phosphatase: 159 U/L — ABNORMAL HIGH (ref 38–126)
Anion gap: 10 (ref 5–15)
BUN: 29 mg/dL — ABNORMAL HIGH (ref 8–23)
CO2: 28 mmol/L (ref 22–32)
Calcium: 8.6 mg/dL — ABNORMAL LOW (ref 8.9–10.3)
Chloride: 95 mmol/L — ABNORMAL LOW (ref 98–111)
Creatinine, Ser: 2.07 mg/dL — ABNORMAL HIGH (ref 0.44–1.00)
GFR, Estimated: 23 mL/min — ABNORMAL LOW (ref >60)
Glucose, Bld: 144 mg/dL — ABNORMAL HIGH (ref 70–99)
Potassium: 4.1 mmol/L (ref 3.5–5.1)
Sodium: 133 mmol/L — ABNORMAL LOW (ref 135–145)
Total Bilirubin: 0.9 mg/dL (ref 0.3–1.2)
Total Protein: 5.8 g/dL — ABNORMAL LOW (ref 6.5–8.1)

## 2021-01-31 LAB — CBC
HCT: 33.1 % — ABNORMAL LOW (ref 36.0–46.0)
Hemoglobin: 10.9 g/dL — ABNORMAL LOW (ref 12.0–15.0)
MCH: 30.9 pg (ref 26.0–34.0)
MCHC: 32.9 g/dL (ref 30.0–36.0)
MCV: 93.8 fL (ref 80.0–100.0)
Platelets: 157 10*3/uL (ref 150–400)
RBC: 3.53 MIL/uL — ABNORMAL LOW (ref 3.87–5.11)
RDW: 12.7 % (ref 11.5–15.5)
WBC: 8.4 10*3/uL (ref 4.0–10.5)
nRBC: 0 % (ref 0.0–0.2)

## 2021-01-31 LAB — URINALYSIS, ROUTINE W REFLEX MICROSCOPIC
Bilirubin Urine: NEGATIVE
Glucose, UA: NEGATIVE mg/dL
Ketones, ur: NEGATIVE mg/dL
Nitrite: NEGATIVE
Protein, ur: 100 mg/dL — AB
Specific Gravity, Urine: 1.014 (ref 1.005–1.030)
WBC, UA: >50 WBC/hpf — ABNORMAL HIGH (ref 0–5)
pH: 5 (ref 5.0–8.0)

## 2021-01-31 LAB — GLUCOSE, CAPILLARY
Glucose-Capillary: 139 mg/dL — ABNORMAL HIGH (ref 70–99)
Glucose-Capillary: 125 mg/dL — ABNORMAL HIGH (ref 70–99)
Glucose-Capillary: 101 mg/dL — ABNORMAL HIGH (ref 70–99)
Glucose-Capillary: 120 mg/dL — ABNORMAL HIGH (ref 70–99)

## 2021-01-31 LAB — MAGNESIUM: Magnesium: 2 mg/dL (ref 1.7–2.4)

## 2021-01-31 LAB — BRAIN NATRIURETIC PEPTIDE: B Natriuretic Peptide: 118.6 pg/mL — ABNORMAL HIGH (ref 0.0–100.0)

## 2021-01-31 LAB — TSH: TSH: 2.166 u[IU]/mL (ref 0.350–4.500)

## 2021-01-31 LAB — PHOSPHORUS: Phosphorus: 4 mg/dL (ref 2.5–4.6)

## 2021-01-31 LAB — CK: Total CK: 30 U/L — ABNORMAL LOW (ref 38–234)

## 2021-01-31 MED ORDER — ENOXAPARIN SODIUM 30 MG/0.3ML IJ SOSY
30.0000 mg | PREFILLED_SYRINGE | INTRAMUSCULAR | Status: DC
  Administered 2021-01-31 – 2021-02-02 (×3): 30 mg via SUBCUTANEOUS
  Filled 2021-01-31 (×3): qty 0.3

## 2021-01-31 MED ORDER — CEFTRIAXONE SODIUM 1 G IJ SOLR
1.0000 g | INTRAMUSCULAR | Status: DC
  Administered 2021-01-31 – 2021-02-01 (×2): 1 g via INTRAVENOUS
  Filled 2021-01-31: qty 10
  Filled 2021-01-31: qty 1
  Filled 2021-01-31: qty 10

## 2021-01-31 MED ORDER — GLUCERNA SHAKE PO LIQD
237.0000 mL | Freq: Three times a day (TID) | ORAL | Status: DC
  Administered 2021-01-31: 237 mL via ORAL
  Filled 2021-01-31 (×8): qty 237

## 2021-01-31 MED ORDER — SODIUM CHLORIDE 0.9 % IV SOLN: INTRAVENOUS | Status: AC

## 2021-01-31 MED ORDER — ADULT MULTIVITAMIN W/MINERALS CH
1.0000 | ORAL_TABLET | Freq: Every day | ORAL | Status: DC
  Administered 2021-01-31 – 2021-02-02 (×3): 1 via ORAL
  Filled 2021-01-31 (×3): qty 1

## 2021-01-31 NOTE — Plan of Care (Signed)
  Problem: Education: Goal: Knowledge of General Education information will improve Description: Including pain rating scale, medication(s)/side effects and non-pharmacologic comfort measures Outcome: Completed/Met

## 2021-01-31 NOTE — Progress Notes (Signed)
PROGRESS NOTE  Heather [name] [id number] DOB: 1935/06/21   PCP: Mosie Lukes, MD  Patient is from: Home.  Lately using walker for ambulation.  DOA: 01/28/2021 LOS: 0  Chief complaints:  Chief Complaint  Patient presents with   Shortness of Breath   Post-op Problem     Brief Narrative / Interim history: 86 year old F with PMH of DM-2, diastolic CHF, HTN, prior DVT not on AC and recent lap chole and hernia repair on 7/20 presenting with shortness of breath and chest tightness and admitted for acute respiratory failure with hypoxia presumed to be due to acute on chronic diastolic CHF.  Started on IV Lasix but developed AKI.  Diuretics and other nephrotoxic meds discontinued.  Started on gentle IV fluid.   She also has some dysphagia, odynophagia, anorexia and constipation after surgery.  SLP recommended dysphagia 3 diet.  Also cardiogram pending.  General surgery following.  Subjective: Seen and examined earlier this morning.  She states she had a panic attack last night.  She feels better this morning.  She denies chest pain, dyspnea or GI symptoms.  She reports dysuria since last night.  Denies any back pain.  She had mild fever to 100.4 about 9 PM last night.  Patient's daughter at bedside.  Objective: Vitals:   01/30/21 1453 01/30/21 2052 01/31/21 0500 01/31/21 0615  BP: (!) 137/56 (!) 139/56  (!) 133/51  Pulse: (!) 59 65  (!) 57  Resp: '18 20  18  '$ Temp: 98.2 F (36.8 C) (!) 100.4 F (38 C)  98.3 F (36.8 C)  TempSrc: Oral Oral  Oral  SpO2: 94% 95%  98%  Weight:   69.4 kg   Height:        Intake/Output Summary (Last 24 hours) at 01/31/2021 1318 Last data filed at 01/31/2021 0913 Gross per 24 hour  Intake 360 ml  Output --  Net 360 ml   Filed Weights   01/28/21 1801 01/29/21 1930 01/31/21 0500  Weight: 73 kg 73.6 kg 69.4 kg    Examination:  GENERAL: Sitting on bedside chair.  Nontoxic. HEENT: MMM.  Vision and hearing grossly intact.  NECK: Supple.  No  apparent JVD.  RESP: On RA.  No IWOB.  Fair aeration bilaterally. CVS:  RRR. Heart sounds normal.  ABD/GI/GU: BS+. Abd soft.  Some tenderness from recent surgery.  Surgical wound appears clean. MSK/EXT:  Moves extremities. No apparent deformity. No edema.  SKIN: no apparent skin lesion or wound NEURO: Awake and alert. Oriented appropriately.  No apparent focal neuro deficit. PSYCH: Calm. Normal affect.   Procedures:  None  Microbiology summarized: [id number] and influenza PCR nonreactive.  Assessment & Plan: Acute hypoxic respiratory failure-felt to be due to CHF exacerbation.  Seems to have resolved.  Currently on room air. -IS/OOB/PT/OT/ambulatory saturation  Acute on chronic diastolic CHF: TTE with LVEF of 60 to 65%, indeterminate DD (G2 DD on previous TTE), RVSP of 31.1.  BNP 453>>> 118. CTA chest negative for large PE but interstitial implant some vascular congestion.  Diuresed with IV Lasix and transition to p.o. Lasix.  I&O incomplete but net -2 L.  Creatinine up.  Appears euvolemic on exam.  No respiratory distress.  On room air.  -Discontinue Lasix.  -Discontinue home losartan and Aldactone. -Gentle IVF note over the next 24 hours -Continue Imdur and Toprol-XL -Closely monitor I&O, daily weights, renal functions and electrolytes.   Intra-abdominal fluid collection-CT abdomen shows organizing collection within the gallbladder fossa felt to be postsurgical  seroma. -General surgery managing    Anorexia/dysphagia/odynophagia/constipation-reports solid dysphagia after intubation for recent surgery.  TSH within normal. -Dysphagia 3 diet per SLP -Follow esophageal gram -Recommend avoiding CCB such as amlodipine -Bowel regimen -Appreciate input by dietitian  AKI/azotemia: Baseline Cr 1.0-1.1. AKI likely due to poor p.o. intake and nephrotoxic meds (Lasix, Toradol, Aldactone and losartan) Recent Labs    10/03/20 1138 10/27/20 1119 11/09/20 1216 11/24/20 1236 01/17/21 1134  01/26/21 0413 01/28/21 1923 01/29/21 0125 01/30/21 0417 01/31/21 0457  BUN 23 23 29* '22 17 22 '$ 25* 21 22 29*  CREATININE 0.96 1.00 1.40* 0.89 1.09 1.05* 1.08* 1.08* 1.10* 2.07*  -Hold nephrotoxic meds -Urinalysis -Gentle IV fluid as above -Recheck in the morning  Dysuria/possible UTI: Reports dysuria this morning.  She also had mild fever last night. -Start IV ceftriaxone -Check urinalysis and urine culture  Mild LFT elevation: Congestive hepatopathy?  Could be residual after recent cholecystectomy.  Resolved.  Hyponatremia: Could be due to Aldactone and losartan. -Aldactone and losartan discontinued -Monitor  Essential hypertension: Diastolic in [id number]. -Holding home cardiac/antihypertensive meds   Uncontrolled NIDDM-2 with hyperglycemia: A1c .9%.7/20. Recent Labs  Lab 01/26/21 1132 01/30/21 1628 01/30/21 2049 01/31/21 0743 01/31/21 1140  GLUCAP 184* 118* 129* 139* 125*  -Continue SSI-sensitive -Continue NovoLog 3 units 3 times daily with meals  Normocytic anemia: H&H relatively stable. Recent Labs    08/26/20 2159 10/27/20 1119 11/09/20 1216 11/24/20 1236 01/17/21 1134 01/26/21 0413 01/28/21 1923 01/29/21 0125 01/30/21 0417 01/31/21 0457  HGB 12.9 13.9 12.6 13.1 13.4 11.6* 11.9* 11.6* 11.2* 10.9*  -Check anemia panel in the morning  Coronary artery disease s/p remote stent -Cardiac meds as above. -continue statin  Restless leg syndrome -Resume home Mirapex  Generalized weakness: Lately started using walker. -PT/OT eval  Right calf tenderness-LE Doppler negative for DVT.   Thrombocytopenia: Resolved.  Body mass index is 27.98 kg/m.         DVT prophylaxis:  enoxaparin (LOVENOX) injection 30 mg Start: 01/31/21 1000  Code Status: Full code Family Communication: Patient and/or RN. Updated patient's daughter at bedside Level of care: Telemetry Status is: Observation  The patient will require care spanning > 2 midnights and should be moved to  inpatient because: Ongoing diagnostic testing needed not appropriate for outpatient work up, Unsafe d/c plan, and Inpatient level of care appropriate due to severity of illness  Dispo: The patient is from: Home              Anticipated d/c is to:  Home with [facility]              Patient currently is not medically stable to d/c.   Difficult to place patient No       Consultants:  General surgery   Sch Meds:  Scheduled Meds:  aspirin EC  81 mg Oral Daily   cholecalciferol  2,000 Units Oral Daily   enoxaparin (LOVENOX) injection  30 mg Subcutaneous Q24H   insulin aspart  0-5 Units Subcutaneous QHS   insulin aspart  0-9 Units Subcutaneous TID WC   insulin aspart  2 Units Subcutaneous TID WC   isosorbide mononitrate  60 mg Oral QHS   magnesium oxide  400 mg Oral BID   metoprolol succinate  75 mg Oral QHS   mirabegron ER  25 mg Oral Daily   pantoprazole  40 mg Oral Daily   polyethylene glycol  17 g Oral BID   potassium chloride SA  10 mEq Oral QPM  pramipexole  0.25 mg Oral BID   simvastatin  20 mg Oral QHS   sodium chloride flush  3 mL Intravenous Q12H   Continuous Infusions:  sodium chloride     sodium chloride 75 mL/hr at 01/31/21 0941   cefTRIAXone (ROCEPHIN)  IV     PRN Meds:.sodium chloride, acetaminophen, famotidine, morphine injection, ondansetron (ZOFRAN) IV, ondansetron, senna-docusate, sodium chloride flush, sodium phosphate  Antimicrobials: Anti-infectives (From admission, onward)    Start     Dose/Rate Route Frequency Ordered Stop   01/31/21 1400  cefTRIAXone (ROCEPHIN) 1 g in sodium chloride 0.9 % 100 mL IVPB        1 g 200 mL/hr over 30 Minutes Intravenous Every 24 hours 01/31/21 1316 02/05/21 1359        I have personally reviewed the following labs and images: CBC: Recent Labs  Lab 01/26/21 0413 01/28/21 1923 01/29/21 0125 01/30/21 0417 01/31/21 0457  WBC 13.7* 9.4 8.5 6.2 8.4  NEUTROABS  --  7.7  --   --   --   HGB 11.6* 11.9* 11.6* 11.2* 10.9*   HCT 35.4* 36.0 35.0* 33.7* 33.1*  MCV 92.9 94.0 92.8 93.4 93.8  PLT 133* 143* 139* 144* 157   BMP &GFR Recent Labs  Lab 01/26/21 0413 01/28/21 1923 01/29/21 0125 01/30/21 0417 01/31/21 0457  NA 136 133* 132* 132* 133*  K 4.7 5.1 4.0 3.7 4.1  CL 103 98 97* 95* 95*  CO2 '23 25 25 28 28  '$ GLUCOSE 264* 129* 109* 124* 144*  BUN 22 25* 21 22 29*  CREATININE 1.05* 1.08* 1.08* 1.10* 2.07*  CALCIUM 8.8* 9.0 8.8* 8.5* 8.6*  MG 1.5*  --  1.8  --  2.0  PHOS  --   --   --   --  4.0   Estimated Creatinine Clearance: 18.5 mL/min (A) (by C-G formula based on SCr of 2.07 mg/dL (H)). Liver & Pancreas: Recent Labs  Lab 01/28/21 1923 01/30/21 0417 01/31/21 0457  AST 59* 33 20  ALT 80* 57* 42  ALKPHOS 134* 188* 159*  BILITOT 1.2 0.9 0.9  PROT 6.6 5.7* 5.8*  ALBUMIN 3.6 2.9* 3.0*   Recent Labs  Lab 01/28/21 1923  LIPASE 25   No results for input(s): AMMONIA in the last 168 hours. Diabetic: No results for input(s): HGBA1C in the last 72 hours. Recent Labs  Lab 01/26/21 1132 01/30/21 1628 01/30/21 2049 01/31/21 0743 01/31/21 1140  GLUCAP 184* 118* 129* 139* 125*   Cardiac Enzymes: Recent Labs  Lab 01/31/21 0457  CKTOTAL 30*   Recent Labs    08/19/20 1351  PROBNP 428   Coagulation Profile: No results for input(s): INR, PROTIME in the last 168 hours. Thyroid Function Tests: Recent Labs    01/31/21 0501  TSH 2.166   Lipid Profile: No results for input(s): CHOL, HDL, LDLCALC, TRIG, CHOLHDL, LDLDIRECT in the last 72 hours. Anemia Panel: No results for input(s): VITAMINB12, FOLATE, FERRITIN, TIBC, IRON, RETICCTPCT in the last 72 hours. Urine analysis:    Component Value Date/Time   COLORURINE STRAW (A) 01/28/2021 2140   APPEARANCEUR CLEAR 01/28/2021 2140   LABSPEC 1.011 01/28/2021 2140   PHURINE 6.0 01/28/2021 2140   GLUCOSEU NEGATIVE 01/28/2021 2140   GLUCOSEU NEGATIVE 09/21/2016 1103   HGBUR NEGATIVE 01/28/2021 2140   BILIRUBINUR NEGATIVE 01/28/2021 2140    BILIRUBINUR neg 11/24/2020 [place] 01/28/2021 2140   PROTEINUR NEGATIVE 01/28/2021 2140   UROBILINOGEN 0.2 11/24/2020 1204   UROBILINOGEN 0.2  09/21/2016 1103   NITRITE NEGATIVE 01/28/2021 2140   LEUKOCYTESUR NEGATIVE 01/28/2021 2140   Sepsis Labs: Invalid input(s): PROCALCITONIN, [place]  Microbiology: Recent Results (from the past 240 hour(s))  SARS CORONAVIRUS 2 (TAT 6-24 HRS) Nasopharyngeal Nasopharyngeal Swab     Status: None   Collection Time: 01/24/21  2:02 PM   Specimen: Nasopharyngeal Swab  Result Value Ref Range Status   SARS Coronavirus 2 NEGATIVE NEGATIVE Final    Comment: (NOTE) SARS-CoV-2 target nucleic acids are NOT DETECTED.  The SARS-CoV-2 RNA is generally detectable in upper and lower respiratory specimens during the acute phase of infection. Negative results do not preclude SARS-CoV-2 infection, do not rule out co-infections with other pathogens, and should not be used as the sole basis for treatment or other patient management decisions. Negative results must be combined with clinical observations, patient history, and epidemiological information. The expected result is Negative.  Fact Sheet for Patients: SugarRoll.be  Fact Sheet for Healthcare Providers: https://www.woods-mathews.com/  This test is not yet approved or cleared by the [place] FDA and  has been authorized for detection and/or diagnosis of SARS-CoV-2 by FDA under an Emergency Use Authorization (EUA). This EUA will remain  in effect (meaning this test can be used) for the duration of the COVID-19 declaration under Se ction 564(b)(1) of the Act, 21 U.S.C. section 360bbb-3(b)(1), unless the authorization is terminated or revoked sooner.  Performed at [facility], [place] [address] [place], [place] [address]   Resp Panel by RT-PCR (Flu A&B, Covid) Nasopharyngeal Swab     Status: None   Collection Time: 01/28/21  7:23  PM   Specimen: Nasopharyngeal Swab; Nasopharyngeal(NP) swabs in vial transport medium  Result Value Ref Range Status   SARS Coronavirus 2 by RT PCR NEGATIVE NEGATIVE Final    Comment: (NOTE) SARS-CoV-2 target nucleic acids are NOT DETECTED.  The SARS-CoV-2 RNA is generally detectable in upper respiratory specimens during the acute phase of infection. The lowest concentration of SARS-CoV-2 viral copies this assay can detect is 138 copies/mL. A negative result does not preclude SARS-Cov-2 infection and should not be used as the sole basis for treatment or other patient management decisions. A negative result may occur with  improper specimen collection/handling, submission of specimen other than nasopharyngeal swab, presence of viral mutation(s) within the areas targeted by this assay, and inadequate number of viral copies(<138 copies/mL). A negative result must be combined with clinical observations, patient history, and epidemiological information. The expected result is Negative.  Fact Sheet for Patients:  EntrepreneurPulse.com.au  Fact Sheet for Healthcare Providers:  IncredibleEmployment.be  This test is no t yet approved or cleared by the [place] FDA and  has been authorized for detection and/or diagnosis of SARS-CoV-2 by FDA under an Emergency Use Authorization (EUA). This EUA will remain  in effect (meaning this test can be used) for the duration of the COVID-19 declaration under Section 564(b)(1) of the Act, 21 U.S.C.section 360bbb-3(b)(1), unless the authorization is terminated  or revoked sooner.       Influenza A by PCR NEGATIVE NEGATIVE Final   Influenza B by PCR NEGATIVE NEGATIVE Final    Comment: (NOTE) The Xpert Xpress SARS-CoV-2/FLU/RSV plus assay is intended as an aid in the diagnosis of influenza from Nasopharyngeal swab specimens and should not be used as a sole basis for treatment. Nasal washings and aspirates are  unacceptable for Xpert Xpress SARS-CoV-2/FLU/RSV testing.  Fact Sheet for Patients: EntrepreneurPulse.com.au  Fact Sheet for Healthcare Providers: IncredibleEmployment.be  This  test is not yet approved or cleared by the [place] and has been authorized for detection and/or diagnosis of SARS-CoV-2 by FDA under an Emergency Use Authorization (EUA). This EUA will remain in effect (meaning this test can be used) for the duration of the COVID-19 declaration under Section 564(b)(1) of the Act, 21 U.S.C. section 360bbb-3(b)(1), unless the authorization is terminated or revoked.  Performed at [facility], [place] [address], [place], [place] [address]     Radiology Studies: DG Swallowing Func-Speech Pathology  Result Date: 01/30/2021 Formatting of this result is different from the original. Objective Swallowing Evaluation: Type of Study: MBS-Modified Barium Swallow Study  Patient Details Name: Heather [name] MRN: [id number] Date of Birth: 03/17/1935 Today's Date: 01/30/2021 Time: SLP Start Time (ACUTE ONLY): 52 -SLP Stop Time (ACUTE ONLY): 1350 SLP Time Calculation (min) (ACUTE ONLY): 25 min Past Medical History: Past Medical History: Diagnosis Date  Chronic stable angina ([place])   Coronary artery disease cardiologist--- dr Burt Knack  hx NSTEMI  w/ cardiac cath 03-16-2005 PCI with DES to LCx;   cardiac cath 06-27-2005  PCI with DES to RCA with residual dx LAD manage medcially;  lexiscan 01-26-20211 normal no ischemia, ef 70%;  stress echo w/ dobutamine 10-24-2011 negative ishcmeia , normal ef // Myoview 2/22: EF 67, no ischemia or infarction, no TID, low risk   Diabetes mellitus type 2, diet-controlled ([place])   followed by pcp  (10-19-2020  pt stated checks daily in am,  fasting blood surgar--- 115--120s)  DOE (dyspnea on exertion)   per pt "when I over do",  ok with household chores  Echocardiogram 08/2020   Echocardiogram 2/22: EF 55-60, no RWMA,  mild LVH, Gr 2 DD, GLS-21.7%, normal RVSF, trivial MR, RVSP 39.5  Edema of right lower extremity   GERD (gastroesophageal reflux disease)   Hiatal hernia   recurrence,  hx HH repair 1989  History of cervical cancer   s/p  vaginal hysterectomy  History of DVT of lower extremity 2016  11-29-2014 post op right TKA of right lower extremity and completed xarelto   History of esophageal stricture   hx s/p dilatation's  History of gastric ulcer 2005 approx.  History of palpitations 2010  event monitor 07-07-2009 showed NSR w/ freq. SVT ectopies with short runs, rare PVCs  History of TIA (transient ischemic attack) 06/1999  12-15-2019  per pt had several TIA between 12/ 2000 to 02/ 2001 , was sent to specialist '@[facility] , had test that was normal (10-19-2020 pt stated no TIAs since ) but has residual of essential tremors of right arm/ hand  Hypertension   Intermittent palpitations   IT band syndrome   Migraine   "ice pick headche lasts about 30 seconds"  Mixed hyperlipidemia   Mixed incontinence urge and stress   Multiple thyroid nodules   followed by pcp---   ultrasound 11-22-2014 no bx   (12-15-2019 per pt had a endocrinologist and was told did not need bx)  OA (osteoarthritis)   knees, elbow, hip, ankles  Occasional tremors   right arm/ hand  s/p TIA residual 2000  Osteoporosis   taking vitamin d  Peroneal DVT (deep venous thrombosis) ([place]) 12/22/2014  Right bundle branch block (RBBB) with left anterior fascicular block (LAFB)   RLS (restless legs syndrome)   S/P drug eluting coronary stent placement 2006  03-16-2005  PCI x1 DES to LCx;   06-27-2005  PCI x1 DES to RCA  Urinary retention   post op sling prodecure  on 10-27-2020, has foley cathether Past Surgical History: Past Surgical History: Procedure Laterality Date  ANTERIOR AND POSTERIOR REPAIR N/A 12/22/2019  Procedure: ANTERIOR (CYSTOCELE)  REPAIR;  Surgeon: Janyth Contes, MD;  Location: [facility];  Service: Gynecology;  Laterality: N/A;  ANTERIOR  AND POSTERIOR REPAIR WITH SACROSPINOUS FIXATION N/A 10/27/2020  Procedure: SACROSPINOUS LIGAMENT FIXATION;  Surgeon: Jaquita Folds, MD;  Location: [facility];  Service: Gynecology;  Laterality: N/A;  BLADDER SUSPENSION N/A 10/27/2020  Procedure: TRANSVAGINAL TAPE (TVT) PROCEDURE;  Surgeon: Jaquita Folds, MD;  Location[facility];  Service: Gynecology;  Laterality: N/A;  CATARACT EXTRACTION W/ INTRAOCULAR LENS  IMPLANT, BILATERAL  2015  CHOLECYSTECTOMY N/A 01/25/2021  Procedure: LAPAROSCOPIC CHOLECYSTECTOMY WITH INTRAOPERATIVE CHOLANGIOGRAM AND LYSIS OF ADHESIONS;  Surgeon: Michael Boston, MD;  Location: [facility];  Service: General;  Laterality: N/A;  COLONOSCOPY  last one ?  CORONARY ANGIOPLASTY WITH STENT PLACEMENT  03-16-2005   dr Lia Foyer  PCI and DES x1 to LCx  CORONARY ANGIOPLASTY WITH STENT PLACEMENT  06-27-2005  dr Lia Foyer  PCI and DES x1 to RCA with residual disease LAD 70-80% to manage medically  CYSTOSCOPY N/A 10/27/2020  Procedure: CYSTOSCOPY;  Surgeon: Jaquita Folds, MD;  Location[facility];  Service: Gynecology;  Laterality: N/A;  CYSTOSCOPY N/A 11/09/2020  Procedure: CYSTOSCOPY;  Surgeon: Jaquita Folds, MD;  Location[facility];  Service: Gynecology;  Laterality: N/A;  FOOT SURGERY Left 1990s  left foot stress fracture repair, per pt no hardware  HIATAL HERNIA REPAIR  1989  INCISIONAL HERNIA REPAIR N/A 01/25/2021  Procedure: PRIMARY REPAIR OF INCISIONAL HERNIA;  Surgeon: Michael Boston, MD;  Location: [facility];  Service: General;  Laterality: N/A;  KNEE ARTHROSCOPY Bilateral right ?/   left x2 , last one 09-12-2009 @ [facility]  PUBOVAGINAL SLING N/A 11/09/2020  Procedure: [place];  Surgeon: Jaquita Folds, MD;  Location: [facility];  Service: Gynecology;  Laterality: N/A;  RECTOCELE REPAIR N/A 04/20/2020  Procedure: POSTERIOR REPAIR (RECTOCELE);  Surgeon: Janyth Contes,  MD;  Location: [facility];  Service: Gynecology;  Laterality: N/A;  RECTOCELE REPAIR N/A 10/27/2020  Procedure: POSTERIOR REPAIR (RECTOCELE);  Surgeon: Jaquita Folds, MD;  Location: [facility];  Service: Gynecology;  Laterality: N/A;  total time requested for all procedures is 2 hours  TOTAL KNEE ARTHROPLASTY  11/12/2011  Procedure: TOTAL KNEE ARTHROPLASTY;  Surgeon: Lorn Junes, MD;  Location: [place];  Service: Orthopedics;  Laterality: Left;  Dr Noemi Chapel wants 90 minutes for this case  TOTAL KNEE ARTHROPLASTY Right 11/29/2014  Procedure: RIGHT TOTAL KNEE ARTHROPLASTY;  Surgeon: Vickey Huger, MD;  Location: [facility];  Service: Orthopedics;  Laterality: Right;  UPPER GASTROINTESTINAL ENDOSCOPY  last one 04-25-2017  with dilatation esophageal stricture and savary dilatation  VAGINAL HYSTERECTOMY  1988   no ovaries removed for bleeeding HPI: Patient is an 86 y.o. female with PMH: DM-2, HTN, prior DVT post knee replacement, HFpEF (grade 2 DD in February 2022) who underwent a lap chole + hernia repair surgery on 7/20 and discharged 7/21. She returned to ED on 7/23 with c/o SOB with tightness, hypoxia, abdominal soreness, no BM since surgery, no nausea or vomiting. She is also c/o feeling of solid foods getting stuck when trying to swallow. CTA chest/abd/pelvis revealed mild pulmonary edema, fluid collection in gall bladder fossa, no large central PE. She was placed on a full liquids diet and SLP was  ordered to assess swallow function.  Subjective: pleasant, alert sitting in radiology suite Assessment / Plan / Recommendation CHL IP CLINICAL IMPRESSIONS 01/30/2021 Clinical Impression Patient presents with a mild oropharyngeal dysphagia and with a suspected structural component with observed osteophytes and cricopharyngeal bar (no radiologist present to confirm) which slowed down bolus transit but did not fully impede and only trace to mild pharyngeal residuals observed post initiail  swallows. During oral phase, patient exhibited brief oral holding of thin liquids prior to swallow, decreased mastication of regular solids and piecemeal swallowing with puree solids and regular solids. Patient exhibited trace vallecular residuals after initial sips of thin liquids and trace to minimal vallecular residuals after initial bites of puree solids. Residuals all eventually cleared with subsequent swallows. She exhibited one instance of trace flash penetration of thin liquids via straw sips, but penetrate fully cleared. No aspiration observed and patient with good airway protection throughout. Barium tablet transit stopped briefly at approximately upper thoracic portion of esophagus when taken with plain, thin water, but puree solids (applesauce) helped to transit tablet fully. SLP spoke with patient and daughter after MBS to discuss nature of her swallow function, provide education regarding swallow safety. SLP recommended patient sit upright for meals (preferably in chair), fully masticate solids, drink liquids throughout solids intake to keep boluses transiting through esophagus, eat/drink smaller meals. Patient and daughter both verbalized understanding. SLP Visit Diagnosis Dysphagia, oropharyngeal phase (R13.12) Attention and concentration deficit following -- Frontal lobe and executive function deficit following -- Impact on safety and function No limitations;Mild aspiration risk   CHL IP TREATMENT RECOMMENDATION 01/30/2021 Treatment Recommendations Therapy as outlined in treatment plan below   Prognosis 01/30/2021 Prognosis for Safe Diet Advancement Good Barriers to Reach Goals -- Barriers/Prognosis Comment -- CHL IP DIET RECOMMENDATION 01/30/2021 SLP Diet Recommendations Dysphagia 3 (Mech soft) solids;Thin liquid Liquid Administration via Cup;Straw Medication Administration Whole meds with puree Compensations Slow rate;Small sips/bites Postural Changes Seated upright at 90 degrees   CHL IP OTHER  RECOMMENDATIONS 01/30/2021 Recommended Consults -- Oral Care Recommendations Oral care BID Other Recommendations --   CHL IP FOLLOW UP RECOMMENDATIONS 01/30/2021 Follow up Recommendations None   CHL IP FREQUENCY AND DURATION 01/30/2021 Speech Therapy Frequency (ACUTE ONLY) min 1 x/week Treatment Duration 1 week      CHL IP ORAL PHASE 01/30/2021 Oral Phase Impaired Oral - Pudding Teaspoon -- Oral - Pudding Cup -- Oral - Honey Teaspoon -- Oral - Honey Cup -- Oral - Nectar Teaspoon -- Oral - Nectar Cup -- Oral - Nectar Straw -- Oral - Thin Teaspoon -- Oral - Thin Cup Other (Comment) Oral - Thin Straw Other (Comment) Oral - Puree Delayed oral transit;Piecemeal swallowing Oral - Mech Soft -- Oral - Regular Impaired mastication;Delayed oral transit;Piecemeal swallowing Oral - Multi-Consistency -- Oral - Pill Reduced posterior propulsion;Decreased bolus cohesion Oral Phase - Comment --  CHL IP PHARYNGEAL PHASE 01/30/2021 Pharyngeal Phase Impaired Pharyngeal- Pudding Teaspoon -- Pharyngeal -- Pharyngeal- Pudding Cup -- Pharyngeal -- Pharyngeal- Honey Teaspoon -- Pharyngeal -- Pharyngeal- Honey Cup -- Pharyngeal -- Pharyngeal- Nectar Teaspoon -- Pharyngeal -- Pharyngeal- Nectar Cup -- Pharyngeal -- Pharyngeal- Nectar Straw -- Pharyngeal -- Pharyngeal- Thin Teaspoon -- Pharyngeal -- Pharyngeal- Thin Cup Pharyngeal residue - valleculae;Pharyngeal residue - pyriform Pharyngeal -- Pharyngeal- Thin Straw Penetration/Aspiration during swallow;Pharyngeal residue - valleculae Pharyngeal Material enters airway, remains ABOVE vocal cords then ejected out Pharyngeal- Puree Pharyngeal residue - valleculae Pharyngeal -- Pharyngeal- Mechanical Soft -- Pharyngeal -- Pharyngeal- Regular Reduced pharyngeal peristalsis;Pharyngeal residue -  valleculae Pharyngeal -- Pharyngeal- Multi-consistency -- Pharyngeal -- Pharyngeal- Pill Reduced pharyngeal peristalsis Pharyngeal -- Pharyngeal Comment --  CHL IP CERVICAL ESOPHAGEAL PHASE 01/30/2021 Cervical  Esophageal Phase Impaired Pudding Teaspoon -- Pudding Cup -- Honey Teaspoon -- Honey Cup -- Nectar Teaspoon -- Nectar Cup -- Nectar Straw -- Thin Teaspoon -- Thin Cup Prominent cricopharyngeal segment Thin Straw Prominent cricopharyngeal segment Puree Prominent cricopharyngeal segment Mechanical Soft -- Regular Prominent cricopharyngeal segment Multi-consistency -- Pill Prominent cricopharyngeal segment Cervical Esophageal Comment spinal curvature as well as appearance of suspected cervical osteophytes (no radiologist to confirm) and cricopharyngeal bar all contributed in slowing down but not impeding transit of boluses in upper esophagus. Sonia Baller, MA, CCC-SLP Speech Therapy                   Edouard Gikas T. Casey  If 7PM-7AM, please contact night-coverage [email or web address] 01/31/2021, 1:18 PM

## 2021-01-31 NOTE — Progress Notes (Signed)
Subjective: CC: No real abdominal pain except when she coughs. When she coughs she has abdominal pain near incisions and some in the RUQ. Intermittent nausea. Was placed on dys3 diet after swallow eval yesterday. Had emesis yesterday after dinner but tolerated breakfast without emesis this am. Mobilizing. Having burning with urination.   Objective: Vital signs in last 24 hours: Temp:  [98.2 F (36.8 C)-100.4 F (38 C)] 98.3 F (36.8 C) (07/26 0615) Pulse Rate:  [57-65] 57 (07/26 0615) Resp:  [18-20] 18 (07/26 0615) BP: (133-139)/(51-56) 133/51 (07/26 0615) SpO2:  [94 %-98 %] 98 % (07/26 0615) Weight:  [69.4 kg] 69.4 kg (07/26 0500) Last BM Date: 01/30/21  Intake/Output from previous day: 07/25 0701 - 07/26 0700 In: 245 [P.O.:245] Out: -  Intake/Output this shift: Total I/O In: 120 [P.O.:120] Out: -   PE: Gen:  Alert, NAD, pleasant, sitting up in chair  HEENT: EOM's intact, pupils equal and round Pulm: Normal rate and effort Abd: Soft, mild distension, appropriately tender around incision without peritonitis, +BS, laparoscopic incisions with glue intact appears well and are without drainage, bleeding, or signs of infection. Supraumbilical incision w/ small skin separation at the base without drainage or bleeding. No signs of infection. ? Ventral hernia superior to supraumbilical incision that is partially reducible Psych: A&Ox3  Ext: R calf tenderess with palpation. No ttp of the L calf. No LE edema. DP 2+.  Skin: no rashes noted, warm and dry  Lab Results:  Recent Labs    01/30/21 0417 01/31/21 0457  WBC 6.2 8.4  HGB 11.2* 10.9*  HCT 33.7* 33.1*  PLT 144* 157   BMET Recent Labs    01/30/21 0417 01/31/21 0457  NA 132* 133*  K 3.7 4.1  CL 95* 95*  CO2 28 28  GLUCOSE 124* 144*  BUN 22 29*  CREATININE 1.10* 2.07*  CALCIUM 8.5* 8.6*   PT/INR No results for input(s): LABPROT, INR in the last 72 hours. CMP     Component Value Date/Time   NA 133 (L)  01/31/2021 0457   NA 139 10/03/2020 1138   K 4.1 01/31/2021 0457   CL 95 (L) 01/31/2021 0457   CO2 28 01/31/2021 0457   GLUCOSE 144 (H) 01/31/2021 0457   BUN 29 (H) 01/31/2021 0457   BUN 23 10/03/2020 1138   CREATININE 2.07 (H) 01/31/2021 0457   CREATININE 0.95 11/10/2013 0328   CALCIUM 8.6 (L) 01/31/2021 0457   PROT 5.8 (L) 01/31/2021 0457   ALBUMIN 3.0 (L) 01/31/2021 0457   AST 20 01/31/2021 0457   ALT 42 01/31/2021 0457   ALKPHOS 159 (H) 01/31/2021 0457   BILITOT 0.9 01/31/2021 0457   GFRNONAA 23 (L) 01/31/2021 0457   GFRAA 57 (L) 08/19/2020 1351   Lipase     Component Value Date/Time   LIPASE 25 01/28/2021 1923    Studies/Results: DG Swallowing Func-Speech Pathology  Result Date: 01/30/2021 Formatting of this result is different from the original. Objective Swallowing Evaluation: Type of Study: MBS-Modified Barium Swallow Study  Patient Details Name: ALFREEDA SHAUT MRN: [id number] Date of Birth: 22-Sep-1934 Today's Date: 01/30/2021 Time: SLP Start Time (ACUTE ONLY): 32 -SLP Stop Time (ACUTE ONLY): 1350 SLP Time Calculation (min) (ACUTE ONLY): 25 min Past Medical History: Past Medical History: Diagnosis Date  Chronic stable angina ([place])   Coronary artery disease cardiologist--- dr Burt Knack  hx NSTEMI  w/ cardiac cath 03-16-2005 PCI with DES to LCx;   cardiac cath 06-27-2005  PCI  with DES to RCA with residual dx LAD manage medcially;  lexiscan 01-26-20211 normal no ischemia, ef 70%;  stress echo w/ dobutamine 10-24-2011 negative ishcmeia , normal ef // Myoview 2/22: EF 67, no ischemia or infarction, no TID, low risk   Diabetes mellitus type 2, diet-controlled ([place])   followed by pcp  (10-19-2020  pt stated checks daily in am,  fasting blood surgar--- 115--120s)  DOE (dyspnea on exertion)   per pt "when I over do",  ok with household chores  Echocardiogram 08/2020   Echocardiogram 2/22: EF 55-60, no RWMA, mild LVH, Gr 2 DD, GLS-21.7%, normal RVSF, trivial MR, RVSP 39.5  Edema of right lower  extremity   GERD (gastroesophageal reflux disease)   Hiatal hernia   recurrence,  hx HH repair 1989  History of cervical cancer   s/p  vaginal hysterectomy  History of DVT of lower extremity 2016  11-29-2014 post op right TKA of right lower extremity and completed xarelto   History of esophageal stricture   hx s/p dilatation's  History of gastric ulcer 2005 approx.  History of palpitations 2010  event monitor 07-07-2009 showed NSR w/ freq. SVT ectopies with short runs, rare PVCs  History of TIA (transient ischemic attack) 06/1999  12-15-2019  per pt had several TIA between 12/ 2000 to 02/ 2001 , was sent to specialist '@[facility] , had test that was normal (10-19-2020 pt stated no TIAs since ) but has residual of essential tremors of right arm/ hand  Hypertension   Intermittent palpitations   IT band syndrome   Migraine   "ice pick headche lasts about 30 seconds"  Mixed hyperlipidemia   Mixed incontinence urge and stress   Multiple thyroid nodules   followed by pcp---   ultrasound 11-22-2014 no bx   (12-15-2019 per pt had a endocrinologist and was told did not need bx)  OA (osteoarthritis)   knees, elbow, hip, ankles  Occasional tremors   right arm/ hand  s/p TIA residual 2000  Osteoporosis   taking vitamin d  Peroneal DVT (deep venous thrombosis) ([place]) 12/22/2014  Right bundle branch block (RBBB) with left anterior fascicular block (LAFB)   RLS (restless legs syndrome)   S/P drug eluting coronary stent placement 2006  03-16-2005  PCI x1 DES to LCx;   06-27-2005  PCI x1 DES to RCA  Urinary retention   post op sling prodecure on 10-27-2020, has foley cathether Past Surgical History: Past Surgical History: Procedure Laterality Date  ANTERIOR AND POSTERIOR REPAIR N/A 12/22/2019  Procedure: ANTERIOR (CYSTOCELE)  REPAIR;  Surgeon: Janyth Contes, MD;  Location: [facility];  Service: Gynecology;  Laterality: N/A;  ANTERIOR AND POSTERIOR REPAIR WITH SACROSPINOUS FIXATION N/A 10/27/2020  Procedure: SACROSPINOUS  LIGAMENT FIXATION;  Surgeon: Jaquita Folds, MD;  Location: [facility];  Service: Gynecology;  Laterality: N/A;  BLADDER SUSPENSION N/A 10/27/2020  Procedure: TRANSVAGINAL TAPE (TVT) PROCEDURE;  Surgeon: Jaquita Folds, MD;  Location: [facility];  Service: Gynecology;  Laterality: N/A;  CATARACT EXTRACTION W/ INTRAOCULAR LENS  IMPLANT, BILATERAL  2015  CHOLECYSTECTOMY N/A 01/25/2021  Procedure: LAPAROSCOPIC CHOLECYSTECTOMY WITH INTRAOPERATIVE CHOLANGIOGRAM AND LYSIS OF ADHESIONS;  Surgeon: Ellana Kawa Boston, MD;  Location: [facility];  Service: General;  Laterality: N/A;  COLONOSCOPY  last one ?  CORONARY ANGIOPLASTY WITH STENT PLACEMENT  03-16-2005   dr Lia Foyer  PCI and DES x1 to LCx  CORONARY ANGIOPLASTY WITH STENT PLACEMENT  06-27-2005  dr Lia Foyer  PCI and DES x1 to RCA  with residual disease LAD 70-80% to manage medically  CYSTOSCOPY N/A 10/27/2020  Procedure: CYSTOSCOPY;  Surgeon: Jaquita Folds, MD;  Location: [facility];  Service: Gynecology;  Laterality: N/A;  CYSTOSCOPY N/A 11/09/2020  Procedure: CYSTOSCOPY;  Surgeon: Jaquita Folds, MD;  Location: [facility];  Service: Gynecology;  Laterality: N/A;  FOOT SURGERY Left 1990s  left foot stress fracture repair, per pt no hardware  HIATAL HERNIA REPAIR  1989  INCISIONAL HERNIA REPAIR N/A 01/25/2021  Procedure: PRIMARY REPAIR OF INCISIONAL HERNIA;  Surgeon: Tityana Pagan Boston, MD;  Location: [facility];  Service: General;  Laterality: N/A;  KNEE ARTHROSCOPY Bilateral right ?/   left x2 , last one 09-12-2009 @ [place]  PUBOVAGINAL SLING N/A 11/09/2020  Procedure: [place];  Surgeon: Jaquita Folds, MD;  Location: [facility];  Service: Gynecology;  Laterality: N/A;  RECTOCELE REPAIR N/A 04/20/2020  Procedure: POSTERIOR REPAIR (RECTOCELE);  Surgeon: Janyth Contes, MD;  Location[facility];  Service: Gynecology;  Laterality: N/A;   RECTOCELE REPAIR N/A 10/27/2020  Procedure: POSTERIOR REPAIR (RECTOCELE);  Surgeon: Jaquita Folds, MD;  Location: [facility];  Service: Gynecology;  Laterality: N/A;  total time requested for all procedures is 2 hours  TOTAL KNEE ARTHROPLASTY  11/12/2011  Procedure: TOTAL KNEE ARTHROPLASTY;  Surgeon: Lorn Junes, MD;  Location: [facility];  Service: Orthopedics;  Laterality: Left;  Dr Noemi Chapel wants 90 minutes for this case  TOTAL KNEE ARTHROPLASTY Right 11/29/2014  Procedure: RIGHT TOTAL KNEE ARTHROPLASTY;  Surgeon: Vickey Huger, MD;  Location: [facility];  Service: Orthopedics;  Laterality: Right;  UPPER GASTROINTESTINAL ENDOSCOPY  last one 04-25-2017  with dilatation esophageal stricture and savary dilatation  VAGINAL HYSTERECTOMY  1988   no ovaries removed for bleeeding HPI: Patient is an 86 y.o. female with PMH: DM-2, HTN, prior DVT post knee replacement, HFpEF (grade 2 DD in February 2022) who underwent a lap chole + hernia repair surgery on 7/20 and discharged 7/21. She returned to ED on 7/23 with c/o SOB with tightness, hypoxia, abdominal soreness, no BM since surgery, no nausea or vomiting. She is also c/o feeling of solid foods getting stuck when trying to swallow. CTA chest/abd/pelvis revealed mild pulmonary edema, fluid collection in gall bladder fossa, no large central PE. She was placed on a full liquids diet and SLP was ordered to assess swallow function.  Subjective: pleasant, alert sitting in radiology suite Assessment / Plan / Recommendation CHL IP CLINICAL IMPRESSIONS 01/30/2021 Clinical Impression Patient presents with a mild oropharyngeal dysphagia and with a suspected structural component with observed osteophytes and cricopharyngeal bar (no radiologist present to confirm) which slowed down bolus transit but did not fully impede and only trace to mild pharyngeal residuals observed post initiail swallows. During oral phase, patient exhibited brief oral holding of thin liquids prior to  swallow, decreased mastication of regular solids and piecemeal swallowing with puree solids and regular solids. Patient exhibited trace vallecular residuals after initial sips of thin liquids and trace to minimal vallecular residuals after initial bites of puree solids. Residuals all eventually cleared with subsequent swallows. She exhibited one instance of trace flash penetration of thin liquids via straw sips, but penetrate fully cleared. No aspiration observed and patient with good airway protection throughout. Barium tablet transit stopped briefly at approximately upper thoracic portion of esophagus when taken with plain, thin water, but puree solids (applesauce) helped to transit tablet fully. SLP spoke with patient and daughter after  MBS to discuss nature of her swallow function, provide education regarding swallow safety. SLP recommended patient sit upright for meals (preferably in chair), fully masticate solids, drink liquids throughout solids intake to keep boluses transiting through esophagus, eat/drink smaller meals. Patient and daughter both verbalized understanding. SLP Visit Diagnosis Dysphagia, oropharyngeal phase (R13.12) Attention and concentration deficit following -- Frontal lobe and executive function deficit following -- Impact on safety and function No limitations;Mild aspiration risk   CHL IP TREATMENT RECOMMENDATION 01/30/2021 Treatment Recommendations Therapy as outlined in treatment plan below   Prognosis 01/30/2021 Prognosis for Safe Diet Advancement Good Barriers to Reach Goals -- Barriers/Prognosis Comment -- CHL IP DIET RECOMMENDATION 01/30/2021 SLP Diet Recommendations Dysphagia 3 (Mech soft) solids;Thin liquid Liquid Administration via Cup;Straw Medication Administration Whole meds with puree Compensations Slow rate;Small sips/bites Postural Changes Seated upright at 90 degrees   CHL IP OTHER RECOMMENDATIONS 01/30/2021 Recommended Consults -- Oral Care Recommendations Oral care BID Other  Recommendations --   CHL IP FOLLOW UP RECOMMENDATIONS 01/30/2021 Follow up Recommendations None   CHL IP FREQUENCY AND DURATION 01/30/2021 Speech Therapy Frequency (ACUTE ONLY) min 1 x/week Treatment Duration 1 week      CHL IP ORAL PHASE 01/30/2021 Oral Phase Impaired Oral - Pudding Teaspoon -- Oral - Pudding Cup -- Oral - Honey Teaspoon -- Oral - Honey Cup -- Oral - Nectar Teaspoon -- Oral - Nectar Cup -- Oral - Nectar Straw -- Oral - Thin Teaspoon -- Oral - Thin Cup Other (Comment) Oral - Thin Straw Other (Comment) Oral - Puree Delayed oral transit;Piecemeal swallowing Oral - Mech Soft -- Oral - Regular Impaired mastication;Delayed oral transit;Piecemeal swallowing Oral - Multi-Consistency -- Oral - Pill Reduced posterior propulsion;Decreased bolus cohesion Oral Phase - Comment --  CHL IP PHARYNGEAL PHASE 01/30/2021 Pharyngeal Phase Impaired Pharyngeal- Pudding Teaspoon -- Pharyngeal -- Pharyngeal- Pudding Cup -- Pharyngeal -- Pharyngeal- Honey Teaspoon -- Pharyngeal -- Pharyngeal- Honey Cup -- Pharyngeal -- Pharyngeal- Nectar Teaspoon -- Pharyngeal -- Pharyngeal- Nectar Cup -- Pharyngeal -- Pharyngeal- Nectar Straw -- Pharyngeal -- Pharyngeal- Thin Teaspoon -- Pharyngeal -- Pharyngeal- Thin Cup Pharyngeal residue - valleculae;Pharyngeal residue - pyriform Pharyngeal -- Pharyngeal- Thin Straw Penetration/Aspiration during swallow;Pharyngeal residue - valleculae Pharyngeal Material enters airway, remains ABOVE vocal cords then ejected out Pharyngeal- Puree Pharyngeal residue - valleculae Pharyngeal -- Pharyngeal- Mechanical Soft -- Pharyngeal -- Pharyngeal- Regular Reduced pharyngeal peristalsis;Pharyngeal residue - valleculae Pharyngeal -- Pharyngeal- Multi-consistency -- Pharyngeal -- Pharyngeal- Pill Reduced pharyngeal peristalsis Pharyngeal -- Pharyngeal Comment --  CHL IP CERVICAL ESOPHAGEAL PHASE 01/30/2021 Cervical Esophageal Phase Impaired Pudding Teaspoon -- Pudding Cup -- Honey Teaspoon -- Honey Cup -- Nectar  Teaspoon -- Nectar Cup -- Nectar Straw -- Thin Teaspoon -- Thin Cup Prominent cricopharyngeal segment Thin Straw Prominent cricopharyngeal segment Puree Prominent cricopharyngeal segment Mechanical Soft -- Regular Prominent cricopharyngeal segment Multi-consistency -- Pill Prominent cricopharyngeal segment Cervical Esophageal Comment spinal curvature as well as appearance of suspected cervical osteophytes (no radiologist to confirm) and cricopharyngeal bar all contributed in slowing down but not impeding transit of boluses in upper esophagus. Sonia Baller, MA, CCC-SLP Speech Therapy              VAS [place] LOWER EXTREMITY VENOUS (DVT)  Result Date: 01/30/2021  Lower Venous DVT Study Patient Name:  KALIOPI GABAY  Date of Exam:   01/30/2021 Medical Rec #: [id number]       Accession #:    [id number] Date of Birth: [birth year]       Patient Gender:  F Patient Age:   17Y Exam Location:  [facility] Procedure:      VAS [place] LOWER EXTREMITY VENOUS (DVT) Referring Phys: [id number] [place] --------------------------------------------------------------------------------  Indications: Edema.  Risk Factors: None identified. Comparison Study: No prior studies. Performing Technologist: Oliver Hum RVT  Examination Guidelines: A complete evaluation includes B-mode imaging, spectral Doppler, color Doppler, and power Doppler as needed of all accessible portions of each vessel. Bilateral testing is considered an integral part of a complete examination. Limited examinations for reoccurring indications may be performed as noted. The reflux portion of the exam is performed with the patient in reverse Trendelenburg.  +---------+---------------+---------+-----------+----------+--------------+ RIGHT    CompressibilityPhasicitySpontaneityPropertiesThrombus Aging +---------+---------------+---------+-----------+----------+--------------+ CFV      Full           Yes      Yes                                  +---------+---------------+---------+-----------+----------+--------------+ SFJ      Full                                                        +---------+---------------+---------+-----------+----------+--------------+ FV Prox  Full                                                        +---------+---------------+---------+-----------+----------+--------------+ FV Mid   Full                                                        +---------+---------------+---------+-----------+----------+--------------+ FV DistalFull                                                        +---------+---------------+---------+-----------+----------+--------------+ PFV      Full                                                        +---------+---------------+---------+-----------+----------+--------------+ POP      Full           Yes      Yes                                 +---------+---------------+---------+-----------+----------+--------------+ PTV      Full                                                        +---------+---------------+---------+-----------+----------+--------------+  PERO     Full                                                        +---------+---------------+---------+-----------+----------+--------------+   +---------+---------------+---------+-----------+----------+--------------+ LEFT     CompressibilityPhasicitySpontaneityPropertiesThrombus Aging +---------+---------------+---------+-----------+----------+--------------+ CFV      Full           Yes      Yes                                 +---------+---------------+---------+-----------+----------+--------------+ SFJ      Full                                                        +---------+---------------+---------+-----------+----------+--------------+ FV Prox  Full                                                         +---------+---------------+---------+-----------+----------+--------------+ FV Mid   Full                                                        +---------+---------------+---------+-----------+----------+--------------+ FV DistalFull                                                        +---------+---------------+---------+-----------+----------+--------------+ PFV      Full                                                        +---------+---------------+---------+-----------+----------+--------------+ POP      Full           Yes      Yes                                 +---------+---------------+---------+-----------+----------+--------------+ PTV      Full                                                        +---------+---------------+---------+-----------+----------+--------------+ PERO     Full                                                        +---------+---------------+---------+-----------+----------+--------------+  Summary: RIGHT: - There is no evidence of deep vein thrombosis in the lower extremity.  - No cystic structure found in the popliteal fossa.  LEFT: - There is no evidence of deep vein thrombosis in the lower extremity.  - No cystic structure found in the popliteal fossa.  *See table(s) above for measurements and observations. Electronically signed by Ruta Hinds MD on 01/30/2021 at 4:33:10 PM.    Final     Anti-infectives: Anti-infectives (From admission, onward)    None        Assessment/Plan POD 6 s/p Laparoscopic cholecystectomy with IOC, Reduction and primary repair of incarcerated incisional hernias - Dr. Johney Maine - 01/30/2021 - IOC negative. LFT's down.  - CT 7/23 w/ fluid in GB fossa. Cannot r/o infection but suspected to be post-operative changes given normal wbc on admission. WBC wnl today but had a temp of 100.4 overnight. Temp could be 2/2 respiratory (atelectasis noted on CT), urinary (now reporting dysuria) or another  etiology. Will hold off on investigating fluid in GB fossa at this time, continue to monitor and discuss with MD - Ventral hernia on CT noted containing fat. Appears she had swiss cheese deformity per Dr. Clyda Greener op note that was repaired primarily. No incarcerated bowel noted on CT. No indication for emergency surgery. - We will follow with you   FEN - FLD, suppository and bowel regimen VTE - SCDs, Lovenox ID - None indicated currently   Dysphagia - SLP following. On dys3 diet and has repeat swallow eval today RLE pain - LE [place] w/ no evidence of DVT CHF w/ pulm edema, b/l pleural effusions and elevated BNP - per TRH HTN DM2 CKD Dysuria - check ua     LOS: 0 days    Jillyn Ledger , [facility] Surgery 01/31/2021, 9:51 AM Please see Amion for pager number during day hours 7:00am-4:30pm

## 2021-01-31 NOTE — Progress Notes (Signed)
Initial Nutrition Assessment  DOCUMENTATION CODES:  Not applicable  INTERVENTION:  Advance diet as medically able per SLP.  Add Glucerna Shake po TID, each supplement provides 220 kcal and 10 grams of protein.  Add Magic cup TID with meals, each supplement provides 290 kcal and 9 grams of protein.  Add MVI with minerals daily.  NUTRITION DIAGNOSIS:  Inadequate oral intake related to poor appetite, dysphagia as evidenced by per patient/family report.  GOAL:  Patient will meet greater than or equal to 90% of their needs  MONITOR:  Diet advancement, PO intake, Supplement acceptance, Labs, Weight trends, I & O's  REASON FOR ASSESSMENT:  Consult Poor PO  ASSESSMENT:  86 yo female with a PMH of 123456, diastolic CHF, HTN, prior DVT, and recent lap chole and hernia repair on 7/20 who presents with acute hypoxic respiratory failure likely d/t CHF exacerbation.  Pt ate 75% of breakfast this morning, but 5% of breakfast yesterday.  Pt with some appetite back today. She reports not eating well due to difficulty swallowing for the past 3-4 days. Awaiting SLP MBSS.  Per Epic, pt has lost ~8 lbs (5.2%) in the last 2 weeks, which is significant and severe for the time frame.  On exam, pt with few mild depletions, likely from age.  Recommend adding Glucerna shakes TID and Magic Cup TID, as well as MVI with minerals daily to promote intake.  Medications: reviewed; Vitamin D, SSI, bedtime Novolog, mealtime Novolog, Mag-Ox BID, Protonix, miralax BID, NaCl @ 75 ml/hr via IV  Labs: reviewed; Na 133 (L), CBG 118-184 (H) HbA1c: 7.9% (01/2021)  NUTRITION - FOCUSED PHYSICAL EXAM: Flowsheet Row Most Recent Value  Orbital Region Mild depletion  Upper Arm Region No depletion  Thoracic and Lumbar Region No depletion  Buccal Region No depletion  Temple Region Mild depletion  Clavicle Bone Region No depletion  Clavicle and Acromion Bone Region No depletion  Scapular Bone Region No depletion   Dorsal Hand No depletion  Patellar Region No depletion  Anterior Thigh Region No depletion  Posterior Calf Region No depletion  Edema (RD Assessment) None  Hair Reviewed  Eyes Reviewed  Mouth Reviewed  Skin Reviewed  Nails Reviewed   Diet Order:   Diet Order             Diet NPO time specified  Diet effective now                  EDUCATION NEEDS:  Education needs have been addressed  Skin:  Skin Assessment: Reviewed RN Assessment  Last BM:  01/30/21  Height:  Ht Readings from Last 1 Encounters:  01/29/21 '5\' 2"'$  (1.575 m)   Weight:  Wt Readings from Last 1 Encounters:  01/31/21 69.4 kg   BMI:  Body mass index is 27.98 kg/m.  Estimated Nutritional Needs:  Kcal:  1550-1750 Protein:  75-90 grams Fluid:  >1.55 L  Derrel Nip, RD, LDN (she/her/hers) Registered Dietitian I After-Hours/Weekend Pager # in [place]

## 2021-01-31 NOTE — Telephone Encounter (Signed)
Informed of bone density results.   Rosemarie Ax, MD [facility] 01/31/2021, 5:49 PM

## 2021-01-31 NOTE — Evaluation (Signed)
Physical Therapy One Time Evaluation Patient Details Name: Heather [name] MRN: [id number] DOB: [birth year] Today's Date: 01/31/2021   History of Present Illness  Patient is an 86 year old female who is status post a laparoscopic cholecystectomy with cholangiogram and repair of an incarcerated incisional hernia on 01/25/2021 by Dr. Johney Maine.  She was discharged home on the 21st and readmitted 7/23 due to acute respiratory failure with hypoxia presumed to be due to acute on chronic diastolic CHF  Clinical Impression  Patient evaluated by Physical Therapy with no further acute PT needs identified. All education has been completed and the patient has no further questions.  Pt eager to mobilize again and assisted with ambulating in hallway.  Daughter reports weakness at home due to multiple surgeries this year and agreeable to HHPT upon d/c.  Pt encouraged to continue ambulating with family or staff during remainder of hospitalization. See below for any follow-up Physical Therapy or equipment needs. PT is signing off. Thank you for this referral.     Follow Up Recommendations Home health PT    Equipment Recommendations  None recommended by PT    Recommendations for Other Services       Precautions / Restrictions Precautions Precautions: Fall Precaution Comments: abdominal incision Restrictions Other Position/Activity Restrictions: Per RN 10 lb weight lifting restriction      Mobility  Bed Mobility               General bed mobility comments: In recliner    Transfers Overall transfer level: Needs assistance Equipment used: Rolling walker (2 wheeled) Transfers: Sit to/from Stand Sit to Stand: Supervision         General transfer comment: Supervision only due to need of verbal cue for hand placement with RW. Pt ambulated 300' in hallway with RW: Mod I.  SpO2 on room air 93% after ambulation; 94-95% at rest.  Ambulation/Gait Ambulation/Gait assistance: Supervision Gait Distance  (Feet): 400 Feet Assistive device: Rolling walker (2 wheeled) Gait Pattern/deviations: Step-through pattern;Decreased stride length;Trunk flexed     General Gait Details: cues for RW positioning and posture, SPO2 monitored and 93-95% on room air during ambulation  Stairs            Wheelchair Mobility    Modified Rankin (Stroke Patients Only)       Balance Overall balance assessment: Modified Independent                                           Pertinent Vitals/Pain Pain Assessment: 0-10 Pain Score: 6  Pain Location: abdomen and right plantar foot Pain Descriptors / Indicators: Sore Pain Intervention(s): Repositioned;Monitored during session    Home Living Family/patient expects to be discharged to:: Private residence Living Arrangements: Alone Available Help at Discharge: Family;Available 24 hours/day Type of Home: House Home Access: Stairs to enter Entrance Stairs-Rails: None Entrance Stairs-Number of Steps: 1 Home Layout: One level Home Equipment: Walker - 2 wheels;Cane - single point;Bedside commode;Shower seat;Adaptive equipment Additional Comments: Lift recliner, Adjustable bed    Prior Function Level of Independence: Independent         Comments: Her daughter will be staying with her at discharge.     Hand Dominance   Dominant Hand: Right    Extremity/Trunk Assessment   Upper Extremity Assessment Upper Extremity Assessment: Overall WFL for tasks assessed    Lower Extremity Assessment Lower Extremity Assessment: Generalized weakness  Cervical / Trunk Assessment Cervical / Trunk Assessment: Kyphotic  Communication   Communication: No difficulties  Cognition Arousal/Alertness: Awake/alert Behavior During Therapy: WFL for tasks assessed/performed Overall Cognitive Status: Within Functional Limits for tasks assessed                                        General Comments      Exercises Other  Exercises Other Exercises: Daughter's questions answered re: modification to home furniture to allow for ease with pt transfers including furniture risers and plywood under cushions to firm. Also recommended HH PT to strengthen lower body.   Assessment/Plan    PT Assessment All further PT needs can be met in the next venue of care  PT Problem List Decreased strength;Decreased mobility;Decreased activity tolerance;Decreased knowledge of use of DME       PT Treatment Interventions      PT Goals (Current goals can be found in the Care Plan section)  Acute Rehab PT Goals Patient Stated Goal: Walk PT Goal Formulation: All assessment and education complete, DC therapy    Frequency     Barriers to discharge        Co-evaluation               AM-PAC PT "6 Clicks" Mobility  Outcome Measure Help needed turning from your back to your side while in a flat bed without using bedrails?: None Help needed moving from lying on your back to sitting on the side of a flat bed without using bedrails?: None Help needed moving to and from a bed to a chair (including a wheelchair)?: None Help needed standing up from a chair using your arms (e.g., wheelchair or bedside chair)?: A Little Help needed to walk in hospital room?: A Little Help needed climbing 3-5 steps with a railing? : A Little 6 Click Score: 21    End of Session   Activity Tolerance: Patient tolerated treatment well Patient left: in chair;with call bell/phone within reach;with family/visitor present Nurse Communication: Mobility status PT Visit Diagnosis: Muscle weakness (generalized) (M62.81)    Time: 3005-1102 PT Time Calculation (min) (ACUTE ONLY): 20 min   Charges:   PT Evaluation $PT Eval Low Complexity: 1 Low         Kati PT, DPT Acute Rehabilitation Services Pager: [phone] Office: [phone]    York Ram E 01/31/2021, 12:04 PM

## 2021-01-31 NOTE — Evaluation (Signed)
Occupational Therapy Evaluation Patient Details Name: Heather [name] MRN: [id number] DOB: 18-Feb-1935 Today's Date: 01/31/2021    History of Present Illness Patient is an 86 year old woman s/p Laparoscopic cholecystectomy with intraoperative cholangiogram and Reduction and primary repair of incarcerated incisional hernias on 7/20.  Pt returned on 7/23 due to [facility] and hypoxia.   Clinical Impression   Patient evaluated by Occupational Therapy with no further acute OT needs identified. All education has been completed and the patient has no further questions.  See below for any follow-up Occupational Therapy or equipment needs. OT is signing off. Thank you for this referral.     Follow Up Recommendations  No OT follow up (Home health PT as dtr endorses pt struggling with transfers at home.)    Equipment Recommendations  Other (comment) (sock aid, long bath brush)    Recommendations for Other Services       Precautions / Restrictions Precautions Precautions: Fall Precaution Comments: abdominal incision Restrictions Other Position/Activity Restrictions: Per RN 10 lb weight lifting restriction      Mobility Bed Mobility               General bed mobility comments: In recliner    Transfers     Transfers: Sit to/from Stand Sit to Stand: Supervision         General transfer comment: Supervision only due to need of verbal cue for hand placement with RW. Pt ambulated 300' in hallway with RW: Mod I.  SpO2 on room air 93% after ambulation; 94-95% at rest.    Balance Overall balance assessment: Modified Independent                                         ADL either performed or assessed with clinical judgement   ADL Overall ADL's : Modified independent                                       General ADL Comments: Increased time or different positioning needed to protect abdomen. Otherwise able to perform. Pt and dtr endorsed post-op  difficulty with some LE ADLs. Educated on uses of pt's reacher to assist as well as sock aid and long handled bath brush.  Pt demonstrated tranfer to standard toilet without use of grab abr Mod I, hygiene and clothing management, LE dressing and standing at sink for combing hair and hand hygiene Mod I.     Vision Patient Visual Report: No change from baseline       Perception     Praxis      Pertinent Vitals/Pain Pain Assessment: 0-10 Pain Score: 8  Pain Location: right calf and ABD Pain Descriptors / Indicators: Sore Pain Intervention(s): Limited activity within patient's tolerance;Monitored during session;Repositioned (Pt declined RN contact for pain meds.)     Hand Dominance Right   Extremity/Trunk Assessment Upper Extremity Assessment Upper Extremity Assessment: Overall WFL for tasks assessed   Lower Extremity Assessment Lower Extremity Assessment: Defer to PT evaluation   Cervical / Trunk Assessment Cervical / Trunk Assessment: Kyphotic   Communication Communication Communication: No difficulties   Cognition Arousal/Alertness: Awake/alert Behavior During Therapy: WFL for tasks assessed/performed Overall Cognitive Status: Within Functional Limits for tasks assessed  General Comments       Exercises Other Exercises Other Exercises: Daughter's questions answered re: modification to home furniture to allow for ease with pt transfers including furniture risers and plywood under cushions to firm. Also recommended HH PT to strengthen lower body.   Shoulder Instructions      Home Living Family/patient expects to be discharged to:: Private residence Living Arrangements: Alone Available Help at Discharge: Family;Available 24 hours/day Type of Home: House Home Access: Stairs to enter [employer] of Steps: 1 Entrance Stairs-Rails: None Home Layout: One level     Bathroom Shower/Tub: Animal nutritionist: Handicapped height     Home Equipment: Environmental consultant - 2 wheels;Cane - single point;Bedside commode;Shower seat;Adaptive equipment Adaptive Equipment: Reacher Additional Comments: Lift recliner, Adjustable bed      Prior Functioning/Environment Level of Independence: Independent        Comments: Her daughter will be staying with her at discharge.        OT Problem List: Decreased activity tolerance;Impaired balance (sitting and/or standing)      OT Treatment/Interventions:      OT Goals(Current goals can be found in the care plan section) Acute Rehab OT Goals Patient Stated Goal: Walk OT Goal Formulation: All assessment and education complete, DC therapy  OT Frequency:     Barriers to D/C:            Co-evaluation              AM-PAC OT "6 Clicks" Daily Activity     Outcome Measure Help from another person eating meals?: None Help from another person taking care of personal grooming?: None Help from another person toileting, which includes using toliet, bedpan, or urinal?: None Help from another person bathing (including washing, rinsing, drying)?: None Help from another person to put on and taking off regular upper body clothing?: None Help from another person to put on and taking off regular lower body clothing?: A Little 6 Click Score: 23   End of Session Equipment Utilized During Treatment: Rolling walker Nurse Communication:  (SpO2. Pt endorsing burning pain with urination.)  Activity Tolerance: Patient tolerated treatment well Patient left: in chair;with family/visitor present;with call bell/phone within reach  OT Visit Diagnosis: Pain Pain - Right/Left: Right Pain - part of body: Leg (And ABD)                Time: [id number] OT Time Calculation (min): 42 min Charges:  OT General Charges $OT Visit: 1 Visit OT Evaluation $OT Eval Low Complexity: 1 Low OT Treatments $Self Care/Home Management : 8-22 mins $Therapeutic Activity: 8-22  mins  Anderson Malta, [place] Office: [phone] 01/31/2021  Julien Girt 01/31/2021, 9:18 AM

## 2021-02-01 ENCOUNTER — Other Ambulatory Visit: Payer: Self-pay | Admitting: Family Medicine

## 2021-02-01 DIAGNOSIS — I5033 Acute on chronic diastolic (congestive) heart failure: Secondary | ICD-10-CM

## 2021-02-01 LAB — RENAL FUNCTION PANEL
Albumin: 3.1 g/dL — ABNORMAL LOW (ref 3.5–5.0)
Anion gap: 15 (ref 5–15)
BUN: 24 mg/dL — ABNORMAL HIGH (ref 8–23)
CO2: 24 mmol/L (ref 22–32)
Calcium: 9 mg/dL (ref 8.9–10.3)
Chloride: 98 mmol/L (ref 98–111)
Creatinine, Ser: 1.39 mg/dL — ABNORMAL HIGH (ref 0.44–1.00)
GFR, Estimated: 37 mL/min — ABNORMAL LOW (ref >60)
Glucose, Bld: 110 mg/dL — ABNORMAL HIGH (ref 70–99)
Phosphorus: 3.5 mg/dL (ref 2.5–4.6)
Potassium: 4 mmol/L (ref 3.5–5.1)
Sodium: 137 mmol/L (ref 135–145)

## 2021-02-01 LAB — CBC
HCT: 35.9 % — ABNORMAL LOW (ref 36.0–46.0)
Hemoglobin: 11.6 g/dL — ABNORMAL LOW (ref 12.0–15.0)
MCH: 30.1 pg (ref 26.0–34.0)
MCHC: 32.3 g/dL (ref 30.0–36.0)
MCV: 93 fL (ref 80.0–100.0)
Platelets: 186 10*3/uL (ref 150–400)
RBC: 3.86 MIL/uL — ABNORMAL LOW (ref 3.87–5.11)
RDW: 12.6 % (ref 11.5–15.5)
WBC: 7.5 10*3/uL (ref 4.0–10.5)
nRBC: 0 % (ref 0.0–0.2)

## 2021-02-01 LAB — RETICULOCYTES
Immature Retic Fract: 12.5 % (ref 2.3–15.9)
RBC.: 3.81 MIL/uL — ABNORMAL LOW (ref 3.87–5.11)
Retic Count, Absolute: 94.5 10*3/uL (ref 19.0–186.0)
Retic Ct Pct: 2.5 % (ref 0.4–3.1)

## 2021-02-01 LAB — FERRITIN: Ferritin: 221 ng/mL (ref 11–307)

## 2021-02-01 LAB — GLUCOSE, CAPILLARY
Glucose-Capillary: 113 mg/dL — ABNORMAL HIGH (ref 70–99)
Glucose-Capillary: 218 mg/dL — ABNORMAL HIGH (ref 70–99)
Glucose-Capillary: 89 mg/dL (ref 70–99)
Glucose-Capillary: 116 mg/dL — ABNORMAL HIGH (ref 70–99)

## 2021-02-01 LAB — IRON AND TIBC
Iron: 22 ug/dL — ABNORMAL LOW (ref 28–170)
Saturation Ratios: 9 % — ABNORMAL LOW (ref 10.4–31.8)
TIBC: 238 ug/dL — ABNORMAL LOW (ref 250–450)
UIBC: 216 ug/dL

## 2021-02-01 LAB — FOLATE: Folate: 20 ng/mL (ref >5.9)

## 2021-02-01 LAB — MAGNESIUM: Magnesium: 2 mg/dL (ref 1.7–2.4)

## 2021-02-01 LAB — VITAMIN B12: Vitamin B-12: 920 pg/mL — ABNORMAL HIGH (ref 180–914)

## 2021-02-01 MED ORDER — CEFDINIR 300 MG PO CAPS: 300.0000 mg | ORAL_CAPSULE | Freq: Two times a day (BID) | ORAL | 0 refills | Status: DC

## 2021-02-01 MED ORDER — FUROSEMIDE 20 MG PO TABS: 20.0000 mg | ORAL_TABLET | Freq: Every day | ORAL | 2 refills | Status: DC | PRN

## 2021-02-01 NOTE — Progress Notes (Signed)
Speech Language Pathology Treatment: Dysphagia  Patient Details Name: Heather [name] MRN: [id number] DOB: 07-18-1934 Today's Date: 02/01/2021 Time: 1655-3748 SLP Time Calculation (min) (ACUTE ONLY): 31 min  Assessment / Plan / Recommendation Clinical Impression  SLP follow up indicated due to pt's minimal dysphagia per MBS.  Pt underwent MBS 01/29/2021 and esophagram 01/31/2021.  Follow up with GI advised from radiologist and hospitalist advised follow up as an OP.  Pt advises she sees Dr Havery [name].    Follow up re: her oropharyngeal swallow ability and to provide esophageal compensations indicated given findings of esophageal dysmotility, hiatal hernia.  Also noted per esophagram pt with "small contour irregularity along the right lateral aspect of the mid-to-distal esophagus" per esophagram.  Pt reports dysphagia since her gallbladder surgery - pointing to pharynx -left side- to indicate discomfort.  She reports improvement in her odynophagia.    Reviewed flouro loops of MBS - showing mild laryngeal penetration and minimal vallecular residuals - WFL for her age.  Pt admits to getting "choked" on corn premorbidly x2 as well as on a large pill.  Chronic esophageal dysphagia noted.  SLP advised pt to potential alternative ways to take her medications for increased ease of swallowing.    Of note, pt was not sensate to barium tablet halting in esophagus - thus recommend she take medication with plenty of water even if attempt with pudding first.  Also recommend she start all intake with liquids due to her xerostomia.  Pt does admit to some dyspnea at times and SLP had pt and her daughter, Beverlee Nims, conduct dry swallow to note normal apnea with swallow - demonstrating importance of pt ceasing po intake if dyspneic.    Pt observed consuming water without any indications of oropharyngeal dysphagia.    She endorses regurgitating liquids and foods at times and acknowledges signficant "indigestion" - backflow  of acidic material into pharynx during the middle of the night, etc.    No SLP follow up indicated as pt with adequate oropharyngeal swallow ability for her age and she and daughter have been educated to compensations to use as indicated.  HPI HPI: Patient is an 86 y.o. female with PMH: DM-2, HTN, prior DVT post knee replacement, HFpEF (grade 2 DD in February 2022) who underwent a lap chole + hernia repair surgery on 7/20 and discharged 7/21. She returned to ED on 7/23 with c/o SOB with tightness, hypoxia, abdominal soreness, no BM since surgery, no nausea or vomiting. She is also c/o feeling of solid foods getting stuck when trying to swallow. CTA chest/abd/pelvis revealed mild pulmonary edema, fluid collection in gall bladder fossa, no large central PE. She was placed on a full liquids diet and SLP was ordered to assess swallow function.  Pt underwent MBS and also esophagram.  Follow up with GI advised from radiologist and hospitalist advised follow up as an OP.  Pt advises she sees Dr Havery [name].  Follow up re: her oropharyngeal swallow ability and to provide esophageal compensations indicated given findings of esophageal dysmotility, hiatal hernia.  Also noted per esophagram pt with "small contour irregularity along the right lateral aspect of the mid-to-distal esophagus" per esophagram.  Pt reports dysphagia since her gallbladder surgery - pointing to pharynx -left side- to indicate discomfort.  She reports improvement in her odynophagia.  Reviewed flouro loops of MBS - showing mild laryngeal penetration and minimal vallecular residuals - WFL for her age.  Pt admits to getting "choked" on corn before x2 as well as on  a large pill.  She was advised to potential alternative ways to take her medications for increased ease of swallowing.  Of note, pt was not sensate to barium tablet halting in esophagus - thus recommend she take medication with plenty of water even if attempt with pudding first.  Also recommend  she start all intake with liquids due to her xerostomia.  Pt does admit to some dyspnea at times and SLP had pt and her daughter, Beverlee Nims, conduct dry swallow to note normal apnea with swallow - demonstrating importance of pt ceasing po intake if dyspneic.  Pt observed consuming water without any indications of oropharyngeal dysphagia.  She endorses regurgitating liquids and foods at times and acknowledges signficant "indigestion" - backflow of acidic material into pharynx during the middle of the night, etc.  No SLP follow up indicated as pt with adequate oropharyngeal swallow ability for her age and she and daughter have been educated to compensations to use as indicated.      SLP Plan  All goals met       Recommendations  Diet recommendations: Regular;Thin liquid Liquids provided via: Cup;Straw Medication Administration: Other (Comment) (as tolerated but with plenty of liquids to follow) Compensations: Slow rate;Small sips/bites (small frequent meals) Postural Changes and/or Swallow Maneuvers: Seated upright 90 degrees;Upright 30-60 min after meal                Oral Care Recommendations: Oral care BID Follow up Recommendations: None SLP Visit Diagnosis: Dysphagia, oropharyngeal phase (R13.12) Plan: All goals met       GO               Kathleen Lime, MS [facility] SLP Acute Rehab Services Office [phone] Pager [phone]  Macario Golds 02/01/2021, 5:48 PM

## 2021-02-01 NOTE — Progress Notes (Signed)
PROGRESS NOTE    Heather [name]  [id number] DOB: 1935-01-03 DOA: 01/28/2021 PCP: Mosie Lukes, MD    Brief Narrative:  Heather [name] is an 86 year old female with past medical history significant for type 2 diabetes mellitus, diastolic congestive heart failure, essential hypertension, prior DVT not on anticoagulation and recent laparoscopic cholecystectomy with hernia repair on 01/2019 22 who presented with shortness of breath and chest tightness and is found to be in acute respiratory failure with hypoxia from acute on chronic diastolic congestive heart failure.   Assessment & Plan:   Principal Problem:   Acute on chronic diastolic CHF (congestive heart failure) (HCC) Active Problems:   Essential hypertension   DM (diabetes mellitus) ([place])   Chronic calculous cholecystitis s/p lap cholecystectomy 01/25/2021   Incarcerated incisional hernia s/p primary repair 01/25/2021   Intra-abdominal fluid collection   UTI (urinary tract infection)   Acute hypoxic respiratory failure: Resolved Patient presenting with progressive shortness of breath and chest tightness and was found to be in acute respiratory failure with hypoxia.  Resolved with IV diuresis as below.   Acute on chronic diastolic CHF:  TTE with LVEF of 60 to 65%, indeterminate DD (G2 DD on previous TTE), RVSP of 31.1.  BNP 453. CTA chest negative for large PE but with vascular congestion.  Diuresed with IV Lasix and transitioned off of supplemental oxygen.  During diuresis, patient's creatinine bumped to 2.07.  Home losartan and Aldactone were discontinued.  Creatinine improved to 1.39 at time of discharge.  Started on furosemide 20 mg p.o. daily as needed for weight gain greater than 3 pounds in 1 day or 5 pounds in 1 week.  Recommend follow-up with cardiology and PCP outpatient.  Recommend BMP at next visit.  Continue Imdur and metoprolol.   Intra-abdominal fluid collection CT abdomen shows organizing collection within the  gallbladder fossa felt to be postsurgical seroma, and no recommendation with outpatient follow-up scheduled.   Anorexia/dysphagia/odynophagia/constipation Reports solid dysphagia after intubation for recent surgery.  TSH within normal.  Esophagram with contour irregularity along right lateral mid/distal esophagus with recommendations of endoscopy to exclude mass/lesion.  Seen by speech therapy and recommended dysphagia 3 diet.  Patient will need follow-up with her primary gastroenterologist at [facility] for further evaluation.   AKI/azotemia:  Baseline Cr 1.0-1.1. AKI likely due to poor p.o. intake and nephrotoxic meds (Lasix, Toradol, Aldactone and losartan).  Following aggressive IV diuresis with Lasix, creatinine trended up to 2.07.  Furosemide IV, losartan, Aldactone were discontinued.   --Cr 2.07>1.39 --repeat BMP in the am   Klebsiella oxytoca UTI: Patient reported dysuria.  Urine culture positive for GNR's.  Review of previous urine cultures notes pansensitive E. coli.   --Continue ceftriaxone --Await urine culture susceptibilities   Coronary artery disease s/p remote stent Continue statin   Restless leg syndrome Resume home Mirapex   Generalized weakness:  Discharged with home health PT   Right calf tenderness LE Doppler negative for DVT.   DVT prophylaxis: enoxaparin (LOVENOX) injection 30 mg Start: 01/31/21 1000   Code Status: Full Code Family Communication: Discussed with daughter over the telephone extensively this afternoon  Disposition Plan:  Level of care: Telemetry Status is: Inpatient  Remains inpatient appropriate because:IV treatments appropriate due to intensity of illness or inability to take PO and Inpatient level of care appropriate due to severity of illness  Dispo: The patient is from: Home              Anticipated d/c is  to: Home              Patient currently is not medically stable to d/c.   Difficult to place patient No  Consultants:  General  surgery  Procedures:  None  Antimicrobials:  Ceftriaxone   Subjective: Patient seen examined at bedside, resting comfortably.  Patient reports poor sleep overnight, anxiousness.  Would like to go home.  But after further discussion with her daughter this afternoon would like to stay 1 more night to ensure renal function stabilizes.  No other questions or concerns at this time.  Updated daughter extensively for 30 minutes on telephone this afternoon.  No other questions or concerns at this time.  Denies headache, no chest pain, palpitations, no current shortness of breath, no current abdominal pain, no weakness, no fatigue, no paresthesias.  No acute events overnight per nursing staff.  Objective: Vitals:   02/01/21 0003 02/01/21 0437 02/01/21 0555 02/01/21 1328  BP: 111/66  (!) 159/67 139/66  Pulse: 70  61 (!) 59  Resp: '17  18 16  '$ Temp: 98.1 F (36.7 C)  97.6 F (36.4 C) 97.6 F (36.4 C)  TempSrc: Oral   Oral  SpO2: 95%  96% 95%  Weight:  72.5 kg    Height:        Intake/Output Summary (Last 24 hours) at 02/01/2021 1538 Last data filed at 02/01/2021 0600 Gross per 24 hour  Intake 1415 ml  Output 620 ml  Net 795 ml   Filed Weights   01/29/21 1930 01/31/21 0500 02/01/21 0437  Weight: 73.6 kg 69.4 kg 72.5 kg    Examination:  General exam: Appears calm and comfortable, elderly in appearance Respiratory system: Clear to auscultation. Respiratory effort normal.  On room air Cardiovascular system: S1 & S2 heard, RRR. No JVD, murmurs, rubs, gallops or clicks. No pedal edema. Gastrointestinal system: Abdomen is nondistended, soft and nontender. No organomegaly or masses felt. Normal bowel sounds heard. Central nervous system: Alert and oriented. No focal neurological deficits. Extremities: Symmetric 5 x 5 power. Skin: No rashes, lesions or ulcers Psychiatry: Judgement and insight appear normal. Mood & affect appropriate.     Data Reviewed: I have personally reviewed  following labs and imaging studies  CBC: Recent Labs  Lab 01/28/21 1923 01/29/21 0125 01/30/21 0417 01/31/21 0457 02/01/21 0450  WBC 9.4 8.5 6.2 8.4 7.5  NEUTROABS 7.7  --   --   --   --   HGB 11.9* 11.6* 11.2* 10.9* 11.6*  HCT 36.0 35.0* 33.7* 33.1* 35.9*  MCV 94.0 92.8 93.4 93.8 93.0  PLT 143* 139* 144* 157 [id number]   Basic Metabolic Panel: Recent Labs  Lab 01/26/21 0413 01/28/21 1923 01/29/21 0125 01/30/21 0417 01/31/21 0457 02/01/21 0450  NA 136 133* 132* 132* 133* 137  K 4.7 5.1 4.0 3.7 4.1 4.0  CL 103 98 97* 95* 95* 98  CO2 '23 25 25 28 28 24  '$ GLUCOSE 264* 129* 109* 124* 144* 110*  BUN 22 25* 21 22 29* 24*  CREATININE 1.05* 1.08* 1.08* 1.10* 2.07* 1.39*  CALCIUM 8.8* 9.0 8.8* 8.5* 8.6* 9.0  MG 1.5*  --  1.8  --  2.0 2.0  PHOS  --   --   --   --  4.0 3.5   GFR: Estimated Creatinine Clearance: 28.1 mL/min (A) (by C-G formula based on SCr of 1.39 mg/dL (H)). Liver Function Tests: Recent Labs  Lab 01/28/21 1923 01/30/21 0417 01/31/21 0457 02/01/21 0450  AST 59* 33 20  --  ALT 80* 57* 42  --   ALKPHOS 134* 188* 159*  --   BILITOT 1.2 0.9 0.9  --   PROT 6.6 5.7* 5.8*  --   ALBUMIN 3.6 2.9* 3.0* 3.1*   Recent Labs  Lab 01/28/21 1923  LIPASE 25   No results for input(s): AMMONIA in the last 168 hours. Coagulation Profile: No results for input(s): INR, PROTIME in the last 168 hours. Cardiac Enzymes: Recent Labs  Lab 01/31/21 0457  CKTOTAL 30*   BNP (last 3 results) Recent Labs    08/19/20 1351  PROBNP 428   HbA1C: No results for input(s): HGBA1C in the last 72 hours. CBG: Recent Labs  Lab 01/31/21 1140 01/31/21 1640 01/31/21 2001 02/01/21 0724 02/01/21 1153  GLUCAP 125* 101* 120* 113* 218*   Lipid Profile: No results for input(s): CHOL, HDL, LDLCALC, TRIG, CHOLHDL, LDLDIRECT in the last 72 hours. Thyroid Function Tests: Recent Labs    01/31/21 0501  TSH 2.166   Anemia Panel: Recent Labs    02/01/21 0450  VITAMINB12 920*  FOLATE  20.0  FERRITIN 221  TIBC 238*  IRON 22*  RETICCTPCT 2.5   Sepsis Labs: No results for input(s): PROCALCITON, LATICACIDVEN in the last 168 hours.  Recent Results (from the past 240 hour(s))  SARS CORONAVIRUS 2 (TAT 6-24 HRS) Nasopharyngeal Nasopharyngeal Swab     Status: None   Collection Time: 01/24/21  2:02 PM   Specimen: Nasopharyngeal Swab  Result Value Ref Range Status   SARS Coronavirus 2 NEGATIVE NEGATIVE Final    Comment: (NOTE) SARS-CoV-2 target nucleic acids are NOT DETECTED.  The SARS-CoV-2 RNA is generally detectable in upper and lower respiratory specimens during the acute phase of infection. Negative results do not preclude SARS-CoV-2 infection, do not rule out co-infections with other pathogens, and should not be used as the sole basis for treatment or other patient management decisions. Negative results must be combined with clinical observations, patient history, and epidemiological information. The expected result is Negative.  Fact Sheet for Patients: SugarRoll.be  Fact Sheet for Healthcare Providers: https://www.woods-mathews.com/  This test is not yet approved or cleared by the [place] FDA and  has been authorized for detection and/or diagnosis of SARS-CoV-2 by FDA under an Emergency Use Authorization (EUA). This EUA will remain  in effect (meaning this test can be used) for the duration of the COVID-19 declaration under Se ction 564(b)(1) of the Act, 21 U.S.C. section 360bbb-3(b)(1), unless the authorization is terminated or revoked sooner.  Performed at [facility], [place] [address] [place],  [address]   Resp Panel by RT-PCR (Flu A&B, Covid) Nasopharyngeal Swab     Status: None   Collection Time: 01/28/21  7:23 PM   Specimen: Nasopharyngeal Swab; Nasopharyngeal(NP) swabs in vial transport medium  Result Value Ref Range Status   SARS Coronavirus 2 by RT PCR NEGATIVE NEGATIVE Final     Comment: (NOTE) SARS-CoV-2 target nucleic acids are NOT DETECTED.  The SARS-CoV-2 RNA is generally detectable in upper respiratory specimens during the acute phase of infection. The lowest concentration of SARS-CoV-2 viral copies this assay can detect is 138 copies/mL. A negative result does not preclude SARS-Cov-2 infection and should not be used as the sole basis for treatment or other patient management decisions. A negative result may occur with  improper specimen collection/handling, submission of specimen other than nasopharyngeal swab, presence of viral mutation(s) within the areas targeted by this assay, and inadequate number of viral copies(<138 copies/mL). A negative  result must be combined with clinical observations, patient history, and epidemiological information. The expected result is Negative.  Fact Sheet for Patients:  EntrepreneurPulse.com.au  Fact Sheet for Healthcare Providers:  IncredibleEmployment.be  This test is no t yet approved or cleared by the [place] FDA and  has been authorized for detection and/or diagnosis of SARS-CoV-2 by FDA under an Emergency Use Authorization (EUA). This EUA will remain  in effect (meaning this test can be used) for the duration of the COVID-19 declaration under Section 564(b)(1) of the Act, 21 U.S.C.section 360bbb-3(b)(1), unless the authorization is terminated  or revoked sooner.       Influenza A by PCR NEGATIVE NEGATIVE Final   Influenza B by PCR NEGATIVE NEGATIVE Final    Comment: (NOTE) The Xpert Xpress SARS-CoV-2/FLU/RSV plus assay is intended as an aid in the diagnosis of influenza from Nasopharyngeal swab specimens and should not be used as a sole basis for treatment. Nasal washings and aspirates are unacceptable for Xpert Xpress SARS-CoV-2/FLU/RSV testing.  Fact Sheet for Patients: EntrepreneurPulse.com.au  Fact Sheet for Healthcare  Providers: IncredibleEmployment.be  This test is not yet approved or cleared by the [place] FDA and has been authorized for detection and/or diagnosis of SARS-CoV-2 by FDA under an Emergency Use Authorization (EUA). This EUA will remain in effect (meaning this test can be used) for the duration of the COVID-19 declaration under Section 564(b)(1) of the Act, 21 U.S.C. section 360bbb-3(b)(1), unless the authorization is terminated or revoked.  Performed at [facility], [place] [address] [place], [place] [address]   Urine Culture     Status: Abnormal (Preliminary result)   Collection Time: 01/31/21 12:49 PM   Specimen: Urine, Clean Catch  Result Value Ref Range Status   Specimen Description   Final    URINE, CLEAN CATCH Performed at [facility], [place] [address] [place], [place] [address]    Special Requests   Final    NONE Performed at [facility], [place] [address], [place], [place] [address]    Culture (A)  Final    >=100,000 COLONIES/mL KLEBSIELLA OXYTOCA SUSCEPTIBILITIES TO FOLLOW Performed at [facility], [place] [address] [place], [place] [address]    Report Status PENDING  Incomplete         Radiology Studies: DG ESOPHAGUS W SINGLE CM (SOL OR THIN BA)  Result Date: 01/31/2021 CLINICAL DATA:  Dysphagia R13.10 (ICD-10-CM). Additional history provided: Patient reports difficulty EXAM: ESOPHOGRAM/BARIUM SWALLOW TECHNIQUE: Single contrast examination was performed using thin barium. FLUOROSCOPY TIME:  Fluoroscopy Time:  1 minutes, 54 seconds. Radiation Exposure Index (if provided by the fluoroscopic device): 30.1 mGy. Number of Acquired Spot Images: 1 COMPARISON:  CT of the chest/abdomen/pelvis 01/28/2021. Report from esophagram 03/21/2000 (images unavailable). FINDINGS: Somewhat limited examination due to the patient's limited ability to reposition. There is a small contour irregularity along  the right lateral aspect of the mid-to-distal esophagus. Fluoroscopic evaluation otherwise demonstrates normal caliber and smooth contour of the esophagus. Prominent intermittent esophageal dysmotility with tertiary contractions. Redemonstrated moderate-sized hiatal hernia. Intermittent, and mildly delayed, passage of contrast from the hiatal hernia into the more distal stomach. The patient swallowed a 13 mm barium tablet, which freely passed into the hiatal hernia. However, the tablet did not pass into the more distal stomach despite a prolonged period of observation. No gastroesophageal reflux was observed. IMPRESSION: Somewhat limited examination due to the patient's limited ability to reposition. Small contour irregularity along the right lateral aspect  of the mid-to-distal esophagus. Endoscopy is recommended to exclude a mass/lesion at this site. Redemonstrated moderate-sized hiatal hernia. Intermittent and mildly delayed passage of contrast from the hiatal hernia into the more distal stomach. A swallowed 13 mm barium tablet freely passed into the hiatal hernia. However, the tablet did not pass into the more distal stomach, despite a prolonged period of observation. Prominent intermittent esophageal dysmotility with tertiary contractions. Electronically Signed   By: Kellie Simmering DO   On: 01/31/2021 15:37        Scheduled Meds:  aspirin EC  81 mg Oral Daily   cholecalciferol  2,000 Units Oral Daily   enoxaparin (LOVENOX) injection  30 mg Subcutaneous Q24H   feeding supplement (GLUCERNA SHAKE)  237 mL Oral TID BM   insulin aspart  0-5 Units Subcutaneous QHS   insulin aspart  0-9 Units Subcutaneous TID WC   insulin aspart  2 Units Subcutaneous TID WC   isosorbide mononitrate  60 mg Oral QHS   magnesium oxide  400 mg Oral BID   metoprolol succinate  75 mg Oral QHS   mirabegron ER  25 mg Oral Daily   multivitamin with minerals  1 tablet Oral Daily   pantoprazole  40 mg Oral Daily   polyethylene  glycol  17 g Oral BID   potassium chloride SA  10 mEq Oral QPM   pramipexole  0.25 mg Oral BID   simvastatin  20 mg Oral QHS   sodium chloride flush  3 mL Intravenous Q12H   Continuous Infusions:  sodium chloride     cefTRIAXone (ROCEPHIN)  IV 1 g (02/01/21 1311)     LOS: 1 day    Time spent: 58 minutes spent on chart review, discussion with nursing staff, consultants, updating family and interview/physical exam; more than 50% of that time was spent in counseling and/or coordination of care.    Margot Oriordan J [place] DO [facility] Available via Epic secure chat 7am-7pm After these hours, please refer to coverage provider listed on [email or web address] 02/01/2021, 3:38 PM

## 2021-02-01 NOTE — Discharge Summary (Addendum)
Physician Discharge Summary  Heather [name] EHO:[id number] DOB: 1934-10-26 DOA: 01/28/2021  PCP: Mosie Lukes, MD  Admit date: 01/28/2021 Discharge date: 02/02/2021  Admitted From: Home Disposition: Home  Recommendations for Outpatient Follow-up:  Follow up with PCP in 1-2 weeks Follow-up with cardiology in 2-3 weeks Will need follow-up with [facility] for irregularity findings on barium swallow Discontinued Aldactone and losartan Furosemide 20 mg p.o. daily as needed for weight gain greater than 3 pounds in 1 day or 5 pounds in 1 week Please obtain BMP in one week to assess renal function Please follow up on the following pending results: Urine culture which was pending at time of discharge  Home Health: PT Equipment/Devices: None  Discharge Condition: Stable CODE STATUS: Full code Diet recommendation: Heart healthy diet  History of present illness:  Heather [name] is an 86 year old female with past medical history significant for type 2 diabetes mellitus, diastolic congestive heart failure, essential hypertension, prior DVT not on anticoagulation and recent laparoscopic cholecystectomy with hernia repair on 01/2019 22 who presented with shortness of breath and chest tightness and is found to be in acute respiratory failure with hypoxia from acute on chronic diastolic congestive heart failure.   Hospital course:  Acute hypoxic respiratory failure: Resolved Patient presenting with progressive shortness of breath and chest tightness and was found to be in acute respiratory failure with hypoxia.  Resolved with IV diuresis as below.   Acute on chronic diastolic CHF:  TTE with LVEF of 60 to 65%, indeterminate DD (G2 DD on previous TTE), RVSP of 31.1.  BNP 453. CTA chest negative for large PE but with vascular congestion.  Diuresed with IV Lasix and transitioned off of supplemental oxygen.  During diuresis, patient's creatinine bumped to 2.07.  Home losartan and Aldactone  were discontinued.  Creatinine improved to 1.39 at time of discharge.  Started on furosemide 20 mg p.o. daily as needed for weight gain greater than 3 pounds in 1 day or 5 pounds in 1 week.  Recommend follow-up with cardiology and PCP outpatient.  Recommend BMP at next visit.  Continue Imdur and metoprolol.  Intra-abdominal fluid collection CT abdomen shows organizing collection within the gallbladder fossa felt to be postsurgical seroma, and no recommendation with outpatient follow-up scheduled.   Anorexia/dysphagia/odynophagia/constipation Reports solid dysphagia after intubation for recent surgery.  TSH within normal.  Esophagram with contour irregularity along right lateral mid/distal esophagus with recommendations of endoscopy to exclude mass/lesion.  Seen by speech therapy and recommended dysphagia 3 diet.  Patient will need follow-up with her primary gastroenterologist at [facility] for further evaluation.   AKI/azotemia:  Baseline Cr 1.0-1.1. AKI likely due to poor p.o. intake and nephrotoxic meds (Lasix, Toradol, Aldactone and losartan).  Following aggressive IV diuresis with Lasix, creatinine trended up to 2.07.  Furosemide IV, losartan, Aldactone were discontinued.  Patient was given gentle IV fluid hydration with return of creatinine to 0.91 at time of discharge.  We will discontinue losartan, Aldactone.  Use furosemide 20 mg as needed as above for weight gain.  Will need repeat BMP at next PCP/specialist visit.   GNR UTI: Patient reported dysuria.  Urine culture positive for GNR's.  Review of previous urine cultures notes pansensitive E. coli.  Was initially started on ceftriaxone, will transition to cefdinir 300 mg p.o. twice daily to complete 5-day antibiotic course.   Coronary artery disease s/p remote stent Continue statin   Restless leg syndrome Resume home Mirapex   Generalized weakness:  Discharged with  home health PT   Right calf tenderness LE Doppler negative for  DVT.  Discharge Diagnoses:  Principal Problem:   Acute on chronic diastolic CHF (congestive heart failure) (HCC) Active Problems:   Essential hypertension   DM (diabetes mellitus) ([place])   Chronic calculous cholecystitis s/p lap cholecystectomy 01/25/2021   Incarcerated incisional hernia s/p primary repair 01/25/2021   Intra-abdominal fluid collection   UTI (urinary tract infection)    Discharge Instructions  Discharge Instructions     (HEART FAILURE PATIENTS) Call MD:  Anytime you have any of the following symptoms: 1) 3 pound weight gain in 24 hours or 5 pounds in 1 week 2) shortness of breath, with or without a dry hacking cough 3) swelling in the hands, feet or stomach 4) if you have to sleep on extra pillows at night in order to breathe.   Complete by: As directed    (HEART FAILURE PATIENTS) Call MD:  Anytime you have any of the following symptoms: 1) 3 pound weight gain in 24 hours or 5 pounds in 1 week 2) shortness of breath, with or without a dry hacking cough 3) swelling in the hands, feet or stomach 4) if you have to sleep on extra pillows at night in order to breathe.   Complete by: As directed    Call MD for:  difficulty breathing, headache or visual disturbances   Complete by: As directed    Call MD for:  difficulty breathing, headache or visual disturbances   Complete by: As directed    Call MD for:  extreme fatigue   Complete by: As directed    Call MD for:  extreme fatigue   Complete by: As directed    Call MD for:  persistant dizziness or light-headedness   Complete by: As directed    Call MD for:  persistant dizziness or light-headedness   Complete by: As directed    Call MD for:  persistant nausea and vomiting   Complete by: As directed    Call MD for:  persistant nausea and vomiting   Complete by: As directed    Call MD for:  severe uncontrolled pain   Complete by: As directed    Call MD for:  severe uncontrolled pain   Complete by: As directed    Call MD  for:  temperature >100.4   Complete by: As directed    Call MD for:  temperature >100.4   Complete by: As directed    Diet - low sodium heart healthy   Complete by: As directed    Increase activity slowly   Complete by: As directed    Increase activity slowly   Complete by: As directed       Allergies as of 02/02/2021       Reactions   Baclofen Other (See Comments)   Hyperactivity   Nitrofurantoin Rash        Medication List     STOP taking these medications    losartan 100 MG tablet Commonly known as: COZAAR   spironolactone 25 MG tablet Commonly known as: ALDACTONE       TAKE these medications    acetaminophen 500 MG tablet Commonly known as: TYLENOL Take 1,000 mg by mouth every 6 (six) hours as needed for moderate pain.   amLODipine 10 MG tablet Commonly known as: NORVASC TAKE 1 TABLET EVERY DAY What changed: when to take this   cefdinir 300 MG capsule Commonly known as: OMNICEF Take 1 capsule (300 mg total)  by mouth 2 (two) times daily for 4 days.   diclofenac Sodium 1 % Gel Commonly known as: VOLTAREN Apply 1 application topically 4 (four) times daily as needed (hip pain).   famotidine 20 MG tablet Commonly known as: PEPCID Take 20 mg by mouth at bedtime as needed for heartburn or indigestion.   furosemide 20 MG tablet Commonly known as: Lasix Take 1 tablet (20 mg total) by mouth daily as needed for edema (weight gain greater than 3 pounds in 1 day or 5 pounds in 1 week).   magnesium oxide 400 MG tablet Commonly known as: MAG-OX Take 400 mg by mouth at bedtime.   metoprolol succinate 50 MG 24 hr tablet Commonly known as: TOPROL-XL Take 1.5 tablets (75 mg total) by mouth at bedtime. Take with or immediately following a meal.   mirabegron ER 25 MG Tb24 tablet Commonly known as: MYRBETRIQ Take 1 tablet (25 mg total) by mouth daily.   ondansetron 4 MG disintegrating tablet Commonly known as: Zofran ODT Take 1 tablet (4 mg total) by mouth  every 6 (six) hours as needed for nausea or vomiting.   pramipexole 0.25 MG tablet Commonly known as: MIRAPEX Take 1 tablet (0.25 mg total) by mouth 2 (two) times daily.   traMADol 50 MG tablet Commonly known as: ULTRAM Take 1 tablet (50 mg total) by mouth every 6 (six) hours as needed for severe pain.   True Metrix Air Glucose Meter w/Device Kit USE TO CHECK BLOOD SUGAR ONCE DAILY AND AS NEEDED.  DX CODE E11.9   True Metrix Blood Glucose Test test strip Generic drug: glucose blood USE TO CHECK BLOOD SUGAR ONCE A DAY OR AS NEEDED.  DX CODE: E11.9   TRUEplus Lancets 33G Misc USE TO CHECK BLOOD SUGAR ONCE DAILY AND AS NEEDED.  DX CODE: E11.9   Vitamin C 500 MG Chew Chew 500 mg by mouth daily.   Vitamin D 50 MCG (2000 UT) Caps Take 2,000 Units by mouth daily.   Zinc 50 MG Tabs Take 50 mg by mouth daily.       ASK your doctor about these medications    aspirin EC 81 MG tablet Take 1 tablet (81 mg total) by mouth daily.   isosorbide mononitrate 60 MG 24 hr tablet Commonly known as: IMDUR Take 1.5 tablets (90 mg total) by mouth daily.   ketorolac 10 MG tablet Commonly known as: TORADOL Take 1 tablet (10 mg total) by mouth every 6 (six) hours as needed.   nitroGLYCERIN 0.4 MG SL tablet Commonly known as: NITROSTAT Place 1 tablet (0.4 mg total) under the tongue every 5 (five) minutes as needed for chest pain.   omeprazole 20 MG capsule Commonly known as: PRILOSEC TAKE 1 CAPSULE EVERY DAY AS NEEDED   potassium chloride SA 20 MEQ tablet Commonly known as: KLOR-CON Take 0.5 tablets (10 mEq total) by mouth daily.   simvastatin 20 MG tablet Commonly known as: ZOCOR TAKE 1 TABLET AT BEDTIME        Follow-up Information     Michael Boston, MD Follow up on 03/14/2021.   Specialties: General Surgery, Colon and Rectal Surgery Why: at 10am for follow up with Dr. Johney Maine. Please arrive 45 minutes prior to your appointment for paperwork. Please bring a copy of your photo ID  and insurance card. Contact information: [address] [place] [address] [phone]         Mosie Lukes, MD. Schedule an appointment as soon as  possible for a visit in 1 week(s).   Specialty: Family Medicine Why: Repeat BMP at visit Contact information: [place] [address] [phone]         [facility] Follow up.   Specialty: Home Health Services Why: [facility] Contact information: [address] [place] [address] [phone]                Allergies  Allergen Reactions   Baclofen Other (See Comments)    Hyperactivity    Nitrofurantoin Rash    Consultations: General surgery   Procedures/Studies: DG Chest 2 View  Result Date: 01/28/2021 CLINICAL DATA:  Cough. Hypoxia. Abdominal pain, status post cholecystectomy and hernia repair 01/25/2021. EXAM: CHEST - 2 VIEW COMPARISON:  09/05/2020 FINDINGS: Mild blunting of both costophrenic angles. Linear opacities at the lung bases favoring subsegmental atelectasis. The patient is rotated to the right on today's radiograph, reducing diagnostic sensitivity and specificity. No appreciable pneumothorax. Mild thoracic kyphosis. Atherosclerotic calcification of the aortic arch. IMPRESSION: 1. Mild bibasilar atelectasis with blunting of the costophrenic angle suggesting small bilateral pleural effusions. No edema. 2.  Aortic Atherosclerosis (ICD10-I70.0). Electronically Signed   By: Van Clines M.D.   On: 01/28/2021 18:30   DG Cholangiogram Operative  Result Date: 01/25/2021 CLINICAL DATA:  Intraoperative cholangiogram EXAM: INTRAOPERATIVE CHOLANGIOGRAM TECHNIQUE: Cholangiographic images from the C-arm fluoroscopic device were submitted for interpretation post-operatively. Please see the procedural report for the amount of contrast and the fluoroscopy time utilized. COMPARISON:  CT abdomen/pelvis 11/24/2020 FINDINGS:  Intraoperative cine clips are submitted for review. The images demonstrate cannulation of the cystic duct remanent with intraoperative cholangiogram. No evidence of biliary ductal dilatation, stenosis or stricture. No evidence of filling defect to suggest choledocholithiasis. Contrast material passes through the ampulla and into the duodenum. IMPRESSION: Negative intraoperative cholangiogram. Electronically Signed   By: Jacqulynn Cadet M.D.   On: 01/25/2021 15:51   CT Angio Chest PE W/Cm &/Or Wo Cm  Addendum Date: 01/28/2021   ADDENDUM REPORT: 01/28/2021 21:54 ADDENDUM: These results were called by telephone at the time of interpretation on 01/28/2021 at 9:54 pm to provider Benay Pike MD, who verbally acknowledged these results. Electronically Signed   By: Lovena Le M.D.   On: 01/28/2021 21:54   Result Date: 01/28/2021 CLINICAL DATA:  Laparoscopic cholecystectomy and conventional ventral hernia repair performed 01/25/2021, now with shortness of breath and hypoxia. EXAM: CT ANGIOGRAPHY CHEST CT ABDOMEN AND PELVIS WITH CONTRAST TECHNIQUE: Multidetector CT imaging of the chest was performed using the standard protocol during bolus administration of intravenous contrast. Multiplanar CT image reconstructions and MIPs were obtained to evaluate the vascular anatomy. Multidetector CT imaging of the abdomen and pelvis was performed using the standard protocol during bolus administration of intravenous contrast. CONTRAST:  41m OMNIPAQUE IOHEXOL 350 MG/ML SOLN COMPARISON:  CT abdomen pelvis 11/24/2020, cholangiogram 01/25/2021, CT chest 08/27/2020 FINDINGS: CTA CHEST FINDINGS Cardiovascular: Fall there is satisfactory opacification of the pulmonary arteries. Exam quality is diminished by extensive cardiac pulsation and respiratory motion artifact throughout the lungs. No large central or lobar filling defects are convincingly demonstrated. More distal evaluation is largely precluded by technical factors. Top-normal  cardiac size. Three-vessel coronary artery atherosclerosis. No pericardial effusion. Cardiac pulsation artifact results in suboptimal assessment of the aortic root and ascending thoracic aorta. Mediastinum/Nodes: Few scattered small subcentimeter hypoattenuating nodules in the thyroid gland are unchanged from comparison prior and unlikely to be clinically significant. Thoracic inlet is otherwise unremarkable.  Moderate hiatal hernia. Fluid-filled distal thoracic esophagus. Tracheal and central airways are unremarkable. No concerning mediastinal, hilar or axillary adenopathy. Lungs/Pleura: Small right trace left pleural effusions with some adjacent areas of passive atelectasis and additional dependent atelectatic features posteriorly. Some clustered nodules seen in the anterior lingula are new region of previously seen consolidation could reflect some postinflammatory change. No other focal airspace opacities are seen. Mild interlobular septal thickening could reflect some mild interstitial edema. Musculoskeletal: Exaggerated thoracic kyphosis. Multilevel degenerative changes are present in the imaged portions of the spine. Additional degenerative changes in the bilateral shoulders. No acute or worrisome chest wall mass or lesion. Review of the MIP images confirms the above findings. CT ABDOMEN and PELVIS FINDINGS Hepatobiliary: Normal hepatic attenuation. No concerning focal liver lesion. Question some micro lobulations along the anterior left lobe liver. Postsurgical changes from cholecystectomy with few foci of free gas along the liver surface and in the gallbladder fossa with a slightly more organized collection towards the terminus of the gallbladder fossa (4/27) measuring approximately 3.3 x 2.2 x 1.9 cm in size. With some surrounding phlegmonous inflammation. Slight prominence of the biliary tree is nonspecific and post cholecystectomy state without visible calcified gallstone. Pancreas: No pancreatic ductal  dilatation or surrounding inflammatory changes. Spleen: Normal in size. No concerning splenic lesions. Adrenals/Urinary Tract: Normal adrenal glands. Kidneys enhance and excrete symmetrically and uniformly. Suspect some mild bilateral renal atrophy. Areas of cortical scarring bilaterally. There is a 2 cm fluid attenuation cyst in the interpolar right kidney with a smaller 1.3 cm cyst in the lower pole (series 9, images 24, 15). No concerning renal mass. No urolithiasis or hydronephrosis. Urinary bladder is unremarkable for the degree of distention. Stomach/Bowel: Moderate hiatal hernia. Distal stomach and duodenum are unremarkable. No small bowel thickening or dilatation. No evidence of bowel obstruction. Normal appendix in the right lower quadrant. Scattered colonic diverticula without focal inflammation to suggest diverticulitis. No colonic dilatation or wall thickening. Vascular/Lymphatic: Atherosclerotic calcifications within the abdominal aorta and branch vessels. No aneurysm or ectasia. No pathologically enlarged abdominopelvic lymph nodes. Some reactive appearing adenopathy at the porta hepatis and in the mid mesentery. Reproductive: Uterus is surgically absent. No concerning adnexal lesions. Other: Postsurgical changes the anterior abdominal wall compatible with recent ventral hernia repair. Stranding of the omental fat anteriorly at the site of operation is nonspecific. A more superior bilobed ventral hernia remains similar to prior with some small volume of free fluid in the left lobulation, likely postsurgical in nature. Few foci of intraperitoneal gas extending along the liver surface as well as gas and fluid in the gallbladder fossa, likely postsurgical in nature though a more organized collection does raise some suspicion. Small amount of free fluid tracking towards the lower abdomen. Mild body wall edema. Musculoskeletal: Multilevel degenerative changes are present in the imaged portions of the spine.  Interspinous arthrosis compatible with [place]'s disease. Review of the MIP images confirms the above findings. IMPRESSION: CT angiography of the chest: 1. Limited assessment for pulmonary artery embolism given extensive respiratory motion and cardiac pulsation artifact. No large central or lobar filling defects are identified. 2. Mild interstitial pulmonary edema and developing bilateral pleural effusions, right greater than left with adjacent areas of passive atelectasis. 3. Some persistent centrilobular ground-glass nodularity seen in the anterior lingula in a region of previously seen consolidation, could reflect some post infectious or inflammatory sequela. 4. Moderate hiatal hernia with fluid-filled distal thoracic esophagus, recommend correlation for aspiration risk. 5. Three-vessel coronary artery atherosclerosis. 6.  Aortic Atherosclerosis (ICD10-I70.0). CT abdomen and pelvis: 1. Postsurgical changes from recent laparoscopic cholecystectomy as well as conventional incisional hernia repair. 2. A small possibly organizing collection seen within the gallbladder fossa of admixed air and low to intermediate attenuation fluid with some questionable rim enhancement, could reflect a postoperative biloma, hematoma or seroma. Superinfection cannot be fully excluded. 3. Some expected postsurgical changes along the anterior abdominal wall at the site of a paraumbilical incisional hernia repair including soft tissue gas along the anterior abdominal wall and mild stranding along the omental fat adjacent the operative site. 4. A more superior bilobed fat containing ventral hernia rim remains, grossly similar to comparison prior albeit with some stranding and free fluid in the hernia contents which can be expected postsurgically. 5. Diverticulosis without evidence of acute diverticulitis. 6.  Aortic Atherosclerosis (ICD10-I70.0). Electronically Signed: By: Lovena Le M.D. On: 01/28/2021 21:46   CT Abdomen Pelvis W  Contrast  Addendum Date: 01/28/2021   ADDENDUM REPORT: 01/28/2021 21:54 ADDENDUM: These results were called by telephone at the time of interpretation on 01/28/2021 at 9:54 pm to provider Benay Pike MD, who verbally acknowledged these results. Electronically Signed   By: Lovena Le M.D.   On: 01/28/2021 21:54   Result Date: 01/28/2021 CLINICAL DATA:  Laparoscopic cholecystectomy and conventional ventral hernia repair performed 01/25/2021, now with shortness of breath and hypoxia. EXAM: CT ANGIOGRAPHY CHEST CT ABDOMEN AND PELVIS WITH CONTRAST TECHNIQUE: Multidetector CT imaging of the chest was performed using the standard protocol during bolus administration of intravenous contrast. Multiplanar CT image reconstructions and MIPs were obtained to evaluate the vascular anatomy. Multidetector CT imaging of the abdomen and pelvis was performed using the standard protocol during bolus administration of intravenous contrast. CONTRAST:  58m OMNIPAQUE IOHEXOL 350 MG/ML SOLN COMPARISON:  CT abdomen pelvis 11/24/2020, cholangiogram 01/25/2021, CT chest 08/27/2020 FINDINGS: CTA CHEST FINDINGS Cardiovascular: Fall there is satisfactory opacification of the pulmonary arteries. Exam quality is diminished by extensive cardiac pulsation and respiratory motion artifact throughout the lungs. No large central or lobar filling defects are convincingly demonstrated. More distal evaluation is largely precluded by technical factors. Top-normal cardiac size. Three-vessel coronary artery atherosclerosis. No pericardial effusion. Cardiac pulsation artifact results in suboptimal assessment of the aortic root and ascending thoracic aorta. Mediastinum/Nodes: Few scattered small subcentimeter hypoattenuating nodules in the thyroid gland are unchanged from comparison prior and unlikely to be clinically significant. Thoracic inlet is otherwise unremarkable. Moderate hiatal hernia. Fluid-filled distal thoracic esophagus. Tracheal and central  airways are unremarkable. No concerning mediastinal, hilar or axillary adenopathy. Lungs/Pleura: Small right trace left pleural effusions with some adjacent areas of passive atelectasis and additional dependent atelectatic features posteriorly. Some clustered nodules seen in the anterior lingula are new region of previously seen consolidation could reflect some postinflammatory change. No other focal airspace opacities are seen. Mild interlobular septal thickening could reflect some mild interstitial edema. Musculoskeletal: Exaggerated thoracic kyphosis. Multilevel degenerative changes are present in the imaged portions of the spine. Additional degenerative changes in the bilateral shoulders. No acute or worrisome chest wall mass or lesion. Review of the MIP images confirms the above findings. CT ABDOMEN and PELVIS FINDINGS Hepatobiliary: Normal hepatic attenuation. No concerning focal liver lesion. Question some micro lobulations along the anterior left lobe liver. Postsurgical changes from cholecystectomy with few foci of free gas along the liver surface and in the gallbladder fossa with a slightly more organized collection towards the terminus of the gallbladder fossa (4/27) measuring approximately 3.3 x 2.2 x 1.9 cm in  size. With some surrounding phlegmonous inflammation. Slight prominence of the biliary tree is nonspecific and post cholecystectomy state without visible calcified gallstone. Pancreas: No pancreatic ductal dilatation or surrounding inflammatory changes. Spleen: Normal in size. No concerning splenic lesions. Adrenals/Urinary Tract: Normal adrenal glands. Kidneys enhance and excrete symmetrically and uniformly. Suspect some mild bilateral renal atrophy. Areas of cortical scarring bilaterally. There is a 2 cm fluid attenuation cyst in the interpolar right kidney with a smaller 1.3 cm cyst in the lower pole (series 9, images 24, 15). No concerning renal mass. No urolithiasis or hydronephrosis. Urinary  bladder is unremarkable for the degree of distention. Stomach/Bowel: Moderate hiatal hernia. Distal stomach and duodenum are unremarkable. No small bowel thickening or dilatation. No evidence of bowel obstruction. Normal appendix in the right lower quadrant. Scattered colonic diverticula without focal inflammation to suggest diverticulitis. No colonic dilatation or wall thickening. Vascular/Lymphatic: Atherosclerotic calcifications within the abdominal aorta and branch vessels. No aneurysm or ectasia. No pathologically enlarged abdominopelvic lymph nodes. Some reactive appearing adenopathy at the porta hepatis and in the mid mesentery. Reproductive: Uterus is surgically absent. No concerning adnexal lesions. Other: Postsurgical changes the anterior abdominal wall compatible with recent ventral hernia repair. Stranding of the omental fat anteriorly at the site of operation is nonspecific. A more superior bilobed ventral hernia remains similar to prior with some small volume of free fluid in the left lobulation, likely postsurgical in nature. Few foci of intraperitoneal gas extending along the liver surface as well as gas and fluid in the gallbladder fossa, likely postsurgical in nature though a more organized collection does raise some suspicion. Small amount of free fluid tracking towards the lower abdomen. Mild body wall edema. Musculoskeletal: Multilevel degenerative changes are present in the imaged portions of the spine. Interspinous arthrosis compatible with Baastrup's disease. Review of the MIP images confirms the above findings. IMPRESSION: CT angiography of the chest: 1. Limited assessment for pulmonary artery embolism given extensive respiratory motion and cardiac pulsation artifact. No large central or lobar filling defects are identified. 2. Mild interstitial pulmonary edema and developing bilateral pleural effusions, right greater than left with adjacent areas of passive atelectasis. 3. Some persistent  centrilobular ground-glass nodularity seen in the anterior lingula in a region of previously seen consolidation, could reflect some post infectious or inflammatory sequela. 4. Moderate hiatal hernia with fluid-filled distal thoracic esophagus, recommend correlation for aspiration risk. 5. Three-vessel coronary artery atherosclerosis. 6.  Aortic Atherosclerosis (ICD10-I70.0). CT abdomen and pelvis: 1. Postsurgical changes from recent laparoscopic cholecystectomy as well as conventional incisional hernia repair. 2. A small possibly organizing collection seen within the gallbladder fossa of admixed air and low to intermediate attenuation fluid with some questionable rim enhancement, could reflect a postoperative biloma, hematoma or seroma. Superinfection cannot be fully excluded. 3. Some expected postsurgical changes along the anterior abdominal wall at the site of a paraumbilical incisional hernia repair including soft tissue gas along the anterior abdominal wall and mild stranding along the omental fat adjacent the operative site. 4. A more superior bilobed fat containing ventral hernia rim remains, grossly similar to comparison prior albeit with some stranding and free fluid in the hernia contents which can be expected postsurgically. 5. Diverticulosis without evidence of acute diverticulitis. 6.  Aortic Atherosclerosis (ICD10-I70.0). Electronically Signed: By: Lovena Le M.D. On: 01/28/2021 21:46   DG Swallowing Func-Speech Pathology  Result Date: 01/30/2021 Formatting of this result is different from the original. Objective Swallowing Evaluation: Type of Study: MBS-Modified Barium Swallow Study  Patient  Details Name: Heather [name] MRN: [id number] Date of Birth: Aug 07, 1934 Today's Date: 01/30/2021 Time: SLP Start Time (ACUTE ONLY): 16 -SLP Stop Time (ACUTE ONLY): 1350 SLP Time Calculation (min) (ACUTE ONLY): 25 min Past Medical History: Past Medical History: Diagnosis Date  Chronic stable angina ([place])    Coronary artery disease cardiologist--- dr Burt Knack  hx NSTEMI  w/ cardiac cath 03-16-2005 PCI with DES to LCx;   cardiac cath 06-27-2005  PCI with DES to RCA with residual dx LAD manage medcially;  lexiscan 01-26-20211 normal no ischemia, ef 70%;  stress echo w/ dobutamine 10-24-2011 negative ishcmeia , normal ef // Myoview 2/22: EF 67, no ischemia or infarction, no TID, low risk   Diabetes mellitus type 2, diet-controlled ([place])   followed by pcp  (10-19-2020  pt stated checks daily in am,  fasting blood surgar--- 115--120s)  DOE (dyspnea on exertion)   per pt "when I over do",  ok with household chores  Echocardiogram 08/2020   Echocardiogram 2/22: EF 55-60, no RWMA, mild LVH, Gr 2 DD, GLS-21.7%, normal RVSF, trivial MR, RVSP 39.5  Edema of right lower extremity   GERD (gastroesophageal reflux disease)   Hiatal hernia   recurrence,  hx HH repair 1989  History of cervical cancer   s/p  vaginal hysterectomy  History of DVT of lower extremity 2016  11-29-2014 post op right TKA of right lower extremity and completed xarelto   History of esophageal stricture   hx s/p dilatation's  History of gastric ulcer 2005 approx.  History of palpitations 2010  event monitor 07-07-2009 showed NSR w/ freq. SVT ectopies with short runs, rare PVCs  History of TIA (transient ischemic attack) 06/1999  12-15-2019  per pt had several TIA between 12/ 2000 to 02/ 2001 , was sent to specialist '@[facility]' , had test that was normal (10-19-2020 pt stated no TIAs since ) but has residual of essential tremors of right arm/ hand  Hypertension   Intermittent palpitations   IT band syndrome   Migraine   "ice pick headche lasts about 30 seconds"  Mixed hyperlipidemia   Mixed incontinence urge and stress   Multiple thyroid nodules   followed by pcp---   ultrasound 11-22-2014 no bx   (12-15-2019 per pt had a endocrinologist and was told did not need bx)  OA (osteoarthritis)   knees, elbow, hip, ankles  Occasional tremors   right arm/ hand  s/p TIA residual  2000  Osteoporosis   taking vitamin d  Peroneal DVT (deep venous thrombosis) ([place]) 12/22/2014  Right bundle branch block (RBBB) with left anterior fascicular block (LAFB)   RLS (restless legs syndrome)   S/P drug eluting coronary stent placement 2006  03-16-2005  PCI x1 DES to LCx;   06-27-2005  PCI x1 DES to RCA  Urinary retention   post op sling prodecure on 10-27-2020, has foley cathether Past Surgical History: Past Surgical History: Procedure Laterality Date  ANTERIOR AND POSTERIOR REPAIR N/A 12/22/2019  Procedure: ANTERIOR (CYSTOCELE)  REPAIR;  Surgeon: Janyth Contes, MD;  Location: [place];  Service: Gynecology;  Laterality: N/A;  ANTERIOR AND POSTERIOR REPAIR WITH SACROSPINOUS FIXATION N/A 10/27/2020  Procedure: SACROSPINOUS LIGAMENT FIXATION;  Surgeon: Jaquita Folds, MD;  Location[facility];  Service: Gynecology;  Laterality: N/A;  BLADDER SUSPENSION N/A 10/27/2020  Procedure: TRANSVAGINAL TAPE (TVT) PROCEDURE;  Surgeon: Jaquita Folds, MD;  Location: [facility];  Service: Gynecology;  Laterality: N/A;  CATARACT EXTRACTION W/ INTRAOCULAR LENS  IMPLANT,  BILATERAL  2015  CHOLECYSTECTOMY N/A 01/25/2021  Procedure: LAPAROSCOPIC CHOLECYSTECTOMY WITH INTRAOPERATIVE CHOLANGIOGRAM AND LYSIS OF ADHESIONS;  Surgeon: Michael Boston, MD;  Location: [facility];  Service: General;  Laterality: N/A;  COLONOSCOPY  last one ?  CORONARY ANGIOPLASTY WITH STENT PLACEMENT  03-16-2005   dr Lia Foyer  PCI and DES x1 to LCx  CORONARY ANGIOPLASTY WITH STENT PLACEMENT  06-27-2005  dr Lia Foyer  PCI and DES x1 to RCA with residual disease LAD 70-80% to manage medically  CYSTOSCOPY N/A 10/27/2020  Procedure: CYSTOSCOPY;  Surgeon: Jaquita Folds, MD;  Location: [facility];  Service: Gynecology;  Laterality: N/A;  CYSTOSCOPY N/A 11/09/2020  Procedure: CYSTOSCOPY;  Surgeon: Jaquita Folds, MD;  Location: [facility];  Service:  Gynecology;  Laterality: N/A;  FOOT SURGERY Left 1990s  left foot stress fracture repair, per pt no hardware  HIATAL HERNIA REPAIR  1989  INCISIONAL HERNIA REPAIR N/A 01/25/2021  Procedure: PRIMARY REPAIR OF INCISIONAL HERNIA;  Surgeon: Michael Boston, MD;  Location: [facility];  Service: General;  Laterality: N/A;  KNEE ARTHROSCOPY Bilateral right ?/   left x2 , last one 09-12-2009 @ [place]  PUBOVAGINAL SLING N/A 11/09/2020  Procedure: [facility];  Surgeon: Jaquita Folds, MD;  Location: [facility];  Service: Gynecology;  Laterality: N/A;  RECTOCELE REPAIR N/A 04/20/2020  Procedure: POSTERIOR REPAIR (RECTOCELE);  Surgeon: Janyth Contes, MD;  Location: [facility];  Service: Gynecology;  Laterality: N/A;  RECTOCELE REPAIR N/A 10/27/2020  Procedure: POSTERIOR REPAIR (RECTOCELE);  Surgeon: Jaquita Folds, MD;  Location: [facility];  Service: Gynecology;  Laterality: N/A;  total time requested for all procedures is 2 hours  TOTAL KNEE ARTHROPLASTY  11/12/2011  Procedure: TOTAL KNEE ARTHROPLASTY;  Surgeon: Lorn Junes, MD;  Location: [place];  Service: Orthopedics;  Laterality: Left;  Dr Noemi Chapel wants 90 minutes for this case  TOTAL KNEE ARTHROPLASTY Right 11/29/2014  Procedure: RIGHT TOTAL KNEE ARTHROPLASTY;  Surgeon: Vickey Huger, MD;  Location: [place];  Service: Orthopedics;  Laterality: Right;  UPPER GASTROINTESTINAL ENDOSCOPY  last one 04-25-2017  with dilatation esophageal stricture and savary dilatation  VAGINAL HYSTERECTOMY  1988   no ovaries removed for bleeeding HPI: Patient is an 86 y.o. female with PMH: DM-2, HTN, prior DVT post knee replacement, HFpEF (grade 2 DD in February 2022) who underwent a lap chole + hernia repair surgery on 7/20 and discharged 7/21. She returned to ED on 7/23 with c/o SOB with tightness, hypoxia, abdominal soreness, no BM since surgery, no nausea or vomiting. She is also c/o feeling of solid foods getting  stuck when trying to swallow. CTA chest/abd/pelvis revealed mild pulmonary edema, fluid collection in gall bladder fossa, no large central PE. She was placed on a full liquids diet and SLP was ordered to assess swallow function.  Subjective: pleasant, alert sitting in radiology suite Assessment / Plan / Recommendation CHL IP CLINICAL IMPRESSIONS 01/30/2021 Clinical Impression Patient presents with a mild oropharyngeal dysphagia and with a suspected structural component with observed osteophytes and cricopharyngeal bar (no radiologist present to confirm) which slowed down bolus transit but did not fully impede and only trace to mild pharyngeal residuals observed post initiail swallows. During oral phase, patient exhibited brief oral holding of thin liquids prior to swallow, decreased mastication of regular solids and piecemeal swallowing with puree solids and regular solids. Patient exhibited trace vallecular residuals after initial sips of thin liquids and trace to minimal vallecular  residuals after initial bites of puree solids. Residuals all eventually cleared with subsequent swallows. She exhibited one instance of trace flash penetration of thin liquids via straw sips, but penetrate fully cleared. No aspiration observed and patient with good airway protection throughout. Barium tablet transit stopped briefly at approximately upper thoracic portion of esophagus when taken with plain, thin water, but puree solids (applesauce) helped to transit tablet fully. SLP spoke with patient and daughter after MBS to discuss nature of her swallow function, provide education regarding swallow safety. SLP recommended patient sit upright for meals (preferably in chair), fully masticate solids, drink liquids throughout solids intake to keep boluses transiting through esophagus, eat/drink smaller meals. Patient and daughter both verbalized understanding. SLP Visit Diagnosis Dysphagia, oropharyngeal phase (R13.12) Attention and  concentration deficit following -- Frontal lobe and executive function deficit following -- Impact on safety and function No limitations;Mild aspiration risk   CHL IP TREATMENT RECOMMENDATION 01/30/2021 Treatment Recommendations Therapy as outlined in treatment plan below   Prognosis 01/30/2021 Prognosis for Safe Diet Advancement Good Barriers to Reach Goals -- Barriers/Prognosis Comment -- CHL IP DIET RECOMMENDATION 01/30/2021 SLP Diet Recommendations Dysphagia 3 (Mech soft) solids;Thin liquid Liquid Administration via Cup;Straw Medication Administration Whole meds with puree Compensations Slow rate;Small sips/bites Postural Changes Seated upright at 90 degrees   CHL IP OTHER RECOMMENDATIONS 01/30/2021 Recommended Consults -- Oral Care Recommendations Oral care BID Other Recommendations --   CHL IP FOLLOW UP RECOMMENDATIONS 01/30/2021 Follow up Recommendations None   CHL IP FREQUENCY AND DURATION 01/30/2021 Speech Therapy Frequency (ACUTE ONLY) min 1 x/week Treatment Duration 1 week      CHL IP ORAL PHASE 01/30/2021 Oral Phase Impaired Oral - Pudding Teaspoon -- Oral - Pudding Cup -- Oral - Honey Teaspoon -- Oral - Honey Cup -- Oral - Nectar Teaspoon -- Oral - Nectar Cup -- Oral - Nectar Straw -- Oral - Thin Teaspoon -- Oral - Thin Cup Other (Comment) Oral - Thin Straw Other (Comment) Oral - Puree Delayed oral transit;Piecemeal swallowing Oral - Mech Soft -- Oral - Regular Impaired mastication;Delayed oral transit;Piecemeal swallowing Oral - Multi-Consistency -- Oral - Pill Reduced posterior propulsion;Decreased bolus cohesion Oral Phase - Comment --  CHL IP PHARYNGEAL PHASE 01/30/2021 Pharyngeal Phase Impaired Pharyngeal- Pudding Teaspoon -- Pharyngeal -- Pharyngeal- Pudding Cup -- Pharyngeal -- Pharyngeal- Honey Teaspoon -- Pharyngeal -- Pharyngeal- Honey Cup -- Pharyngeal -- Pharyngeal- Nectar Teaspoon -- Pharyngeal -- Pharyngeal- Nectar Cup -- Pharyngeal -- Pharyngeal- Nectar Straw -- Pharyngeal -- Pharyngeal- Thin  Teaspoon -- Pharyngeal -- Pharyngeal- Thin Cup Pharyngeal residue - valleculae;Pharyngeal residue - pyriform Pharyngeal -- Pharyngeal- Thin Straw Penetration/Aspiration during swallow;Pharyngeal residue - valleculae Pharyngeal Material enters airway, remains ABOVE vocal cords then ejected out Pharyngeal- Puree Pharyngeal residue - valleculae Pharyngeal -- Pharyngeal- Mechanical Soft -- Pharyngeal -- Pharyngeal- Regular Reduced pharyngeal peristalsis;Pharyngeal residue - valleculae Pharyngeal -- Pharyngeal- Multi-consistency -- Pharyngeal -- Pharyngeal- Pill Reduced pharyngeal peristalsis Pharyngeal -- Pharyngeal Comment --  CHL IP CERVICAL ESOPHAGEAL PHASE 01/30/2021 Cervical Esophageal Phase Impaired Pudding Teaspoon -- Pudding Cup -- Honey Teaspoon -- Honey Cup -- Nectar Teaspoon -- Nectar Cup -- Nectar Straw -- Thin Teaspoon -- Thin Cup Prominent cricopharyngeal segment Thin Straw Prominent cricopharyngeal segment Puree Prominent cricopharyngeal segment Mechanical Soft -- Regular Prominent cricopharyngeal segment Multi-consistency -- Pill Prominent cricopharyngeal segment Cervical Esophageal Comment spinal curvature as well as appearance of suspected cervical osteophytes (no radiologist to confirm) and cricopharyngeal bar all contributed in slowing down but not impeding transit of boluses in upper esophagus.  Sonia Baller, MA, CCC-SLP Speech Therapy              ECHOCARDIOGRAM COMPLETE  Result Date: 01/29/2021    ECHOCARDIOGRAM REPORT   Patient Name:   Heather [name] Date of Exam: 01/29/2021 Medical Rec #:  [id number]      Height:       62.0 in Accession #:    [id number]     Weight:       160.9 lb Date of Birth:  [birth year]      BSA:          1.743 m Patient Age:    70 years       BP:           144/57 mmHg Patient Gender: F              HR:           71 bpm. Exam Location:  Inpatient Procedure: 2D Echo, Cardiac Doppler and Color Doppler Indications:    CHF-Acute Diastolic W97.98  History:        Patient has  prior history of Echocardiogram examinations, most                 recent 08/26/2020. CAD, TIA, Arrythmias:RBBB; Risk                 Factors:Diabetes, Hypertension and Dyslipidemia.  Sonographer:    Bernadene Person RDCS Referring Phys: [phone] JARED M GARDNER  Sonographer Comments: Image acquisition challenging due to respiratory motion. IMPRESSIONS  1. Left ventricular ejection fraction, by estimation, is 60 to 65%. The left ventricle has normal function. The left ventricle has no regional wall motion abnormalities. Left ventricular diastolic parameters are indeterminate.  2. Right ventricular systolic function is normal. The right ventricular size is normal. There is normal pulmonary artery systolic pressure. The estimated right ventricular systolic pressure is 94.1 mmHg.  3. The mitral valve is normal in structure. Trivial mitral valve regurgitation. No evidence of mitral stenosis.  4. The aortic valve is tricuspid. Aortic valve regurgitation is not visualized. Mild to moderate aortic valve sclerosis/calcification is present, without any evidence of aortic stenosis.  5. The inferior vena cava is normal in size with greater than 50% respiratory variability, suggesting right atrial pressure of 3 mmHg. FINDINGS  Left Ventricle: Left ventricular ejection fraction, by estimation, is 60 to 65%. The left ventricle has normal function. The left ventricle has no regional wall motion abnormalities. The left ventricular internal cavity size was normal in size. There is  no left ventricular hypertrophy. Left ventricular diastolic parameters are indeterminate. Normal left ventricular filling pressure. Right Ventricle: The right ventricular size is normal. No increase in right ventricular wall thickness. Right ventricular systolic function is normal. There is normal pulmonary artery systolic pressure. The tricuspid regurgitant velocity is 2.65 m/s, and  with an assumed right atrial pressure of 3 mmHg, the estimated right  ventricular systolic pressure is 74.0 mmHg. Left Atrium: Left atrial size was normal in size. Right Atrium: Right atrial size was normal in size. Pericardium: There is no evidence of pericardial effusion. Mitral Valve: The mitral valve is normal in structure. Mild mitral annular calcification. Trivial mitral valve regurgitation. No evidence of mitral valve stenosis. Tricuspid Valve: The tricuspid valve is normal in structure. Tricuspid valve regurgitation is trivial. No evidence of tricuspid stenosis. Aortic Valve: The aortic valve is tricuspid. Aortic valve regurgitation is not visualized. Mild to moderate aortic valve sclerosis/calcification is present, without any evidence of  aortic stenosis. Pulmonic Valve: The pulmonic valve was normal in structure. Pulmonic valve regurgitation is not visualized. No evidence of pulmonic stenosis. Aorta: The aortic root is normal in size and structure. Venous: The inferior vena cava is normal in size with greater than 50% respiratory variability, suggesting right atrial pressure of 3 mmHg. IAS/Shunts: No atrial level shunt detected by color flow Doppler.  LEFT VENTRICLE PLAX 2D LVIDd:         3.00 cm  Diastology LVIDs:         2.10 cm  LV e' medial:    6.60 cm/s LV PW:         0.90 cm  LV E/e' medial:  14.8 LV IVS:        1.10 cm  LV e' lateral:   9.00 cm/s LVOT diam:     1.90 cm  LV E/e' lateral: 10.9 LV SV:         67 LV SV Index:   38 LVOT Area:     2.84 cm  RIGHT VENTRICLE RV S prime:     12.70 cm/s TAPSE (M-mode): 2.0 cm LEFT ATRIUM             Index       RIGHT ATRIUM           Index LA diam:        3.10 cm 1.78 cm/m  RA Area:     11.60 cm LA Vol (A2C):   52.5 ml 30.12 ml/m RA Volume:   24.70 ml  14.17 ml/m LA Vol (A4C):   47.0 ml 26.96 ml/m LA Biplane Vol: 51.4 ml 29.49 ml/m  AORTIC VALVE LVOT Vmax:   116.00 cm/s LVOT Vmean:  79.600 cm/s LVOT VTI:    0.236 m  AORTA Ao Root diam: 2.80 cm Ao Asc diam:  3.20 cm MITRAL VALVE               TRICUSPID VALVE MV Area (PHT):  3.03 cm    TR Peak grad:   28.1 mmHg MV Decel Time: 250 msec    TR Vmax:        265.00 cm/s MV E velocity: 97.90 cm/s MV A velocity: 94.10 cm/s  SHUNTS MV E/A ratio:  1.04        Systemic VTI:  0.24 m                            Systemic Diam: 1.90 cm Fransico Him MD Electronically signed by Fransico Him MD Signature Date/Time: 01/29/2021/10:38:04 AM    Final    VAS [place] LOWER EXTREMITY VENOUS (DVT)  Result Date: 01/30/2021  Lower Venous DVT Study Patient Name:  Heather [name]  Date of Exam:   01/30/2021 Medical Rec #: [id number]       Accession #:    [id number] Date of Birth: [birth year]       Patient Gender: F Patient Age:   89E Exam Location:  [facility] Procedure:      VAS [place] LOWER EXTREMITY VENOUS (DVT) Referring Phys: [id number] [place] --------------------------------------------------------------------------------  Indications: Edema.  Risk Factors: None identified. Comparison Study: No prior studies. Performing Technologist: Oliver Hum RVT  Examination Guidelines: A complete evaluation includes B-mode imaging, spectral Doppler, color Doppler, and power Doppler as needed of all accessible portions of each vessel. Bilateral testing is considered an integral part of a complete examination. Limited examinations for reoccurring indications may be performed as  noted. The reflux portion of the exam is performed with the patient in reverse Trendelenburg.  +---------+---------------+---------+-----------+----------+--------------+ RIGHT    CompressibilityPhasicitySpontaneityPropertiesThrombus Aging +---------+---------------+---------+-----------+----------+--------------+ CFV      Full           Yes      Yes                                 +---------+---------------+---------+-----------+----------+--------------+ SFJ      Full                                                        +---------+---------------+---------+-----------+----------+--------------+ FV Prox  Full                                                         +---------+---------------+---------+-----------+----------+--------------+ FV Mid   Full                                                        +---------+---------------+---------+-----------+----------+--------------+ FV DistalFull                                                        +---------+---------------+---------+-----------+----------+--------------+ PFV      Full                                                        +---------+---------------+---------+-----------+----------+--------------+ POP      Full           Yes      Yes                                 +---------+---------------+---------+-----------+----------+--------------+ PTV      Full                                                        +---------+---------------+---------+-----------+----------+--------------+ PERO     Full                                                        +---------+---------------+---------+-----------+----------+--------------+   +---------+---------------+---------+-----------+----------+--------------+ LEFT     CompressibilityPhasicitySpontaneityPropertiesThrombus Aging +---------+---------------+---------+-----------+----------+--------------+ CFV      Full           Yes  Yes                                 +---------+---------------+---------+-----------+----------+--------------+ SFJ      Full                                                        +---------+---------------+---------+-----------+----------+--------------+ FV Prox  Full                                                        +---------+---------------+---------+-----------+----------+--------------+ FV Mid   Full                                                        +---------+---------------+---------+-----------+----------+--------------+ FV DistalFull                                                         +---------+---------------+---------+-----------+----------+--------------+ PFV      Full                                                        +---------+---------------+---------+-----------+----------+--------------+ POP      Full           Yes      Yes                                 +---------+---------------+---------+-----------+----------+--------------+ PTV      Full                                                        +---------+---------------+---------+-----------+----------+--------------+ PERO     Full                                                        +---------+---------------+---------+-----------+----------+--------------+     Summary: RIGHT: - There is no evidence of deep vein thrombosis in the lower extremity.  - No cystic structure found in the popliteal fossa.  LEFT: - There is no evidence of deep vein thrombosis in the lower extremity.  - No cystic structure found in the popliteal fossa.  *See table(s) above for measurements and observations. Electronically signed by Ruta Hinds MD on 01/30/2021 at 4:33:10 PM.    Final  DG ESOPHAGUS W SINGLE CM (SOL OR THIN BA)  Result Date: 01/31/2021 CLINICAL DATA:  Dysphagia R13.10 (ICD-10-CM). Additional history provided: Patient reports difficulty EXAM: ESOPHOGRAM/BARIUM SWALLOW TECHNIQUE: Single contrast examination was performed using thin barium. FLUOROSCOPY TIME:  Fluoroscopy Time:  1 minutes, 54 seconds. Radiation Exposure Index (if provided by the fluoroscopic device): 30.1 mGy. Number of Acquired Spot Images: 1 COMPARISON:  CT of the chest/abdomen/pelvis 01/28/2021. Report from esophagram 03/21/2000 (images unavailable). FINDINGS: Somewhat limited examination due to the patient's limited ability to reposition. There is a small contour irregularity along the right lateral aspect of the mid-to-distal esophagus. Fluoroscopic evaluation otherwise demonstrates normal caliber and smooth contour of the  esophagus. Prominent intermittent esophageal dysmotility with tertiary contractions. Redemonstrated moderate-sized hiatal hernia. Intermittent, and mildly delayed, passage of contrast from the hiatal hernia into the more distal stomach. The patient swallowed a 13 mm barium tablet, which freely passed into the hiatal hernia. However, the tablet did not pass into the more distal stomach despite a prolonged period of observation. No gastroesophageal reflux was observed. IMPRESSION: Somewhat limited examination due to the patient's limited ability to reposition. Small contour irregularity along the right lateral aspect of the mid-to-distal esophagus. Endoscopy is recommended to exclude a mass/lesion at this site. Redemonstrated moderate-sized hiatal hernia. Intermittent and mildly delayed passage of contrast from the hiatal hernia into the more distal stomach. A swallowed 13 mm barium tablet freely passed into the hiatal hernia. However, the tablet did not pass into the more distal stomach, despite a prolonged period of observation. Prominent intermittent esophageal dysmotility with tertiary contractions. Electronically Signed   By: Kellie Simmering DO   On: 01/31/2021 15:37     Subjective: Patient seen examined bedside, resting comfortably in bedside chair.  No complaints this morning. Continues on room air with good SPO2.  Creatinine improved to 0.91 today.  Discussed with patient's daughter extensively via telephone on plan of care.  No other questions or concerns at this time.  Denies headache, no chest pain, no palpitations, no shortness of breath, no abdominal pain, no weakness, no fatigue, no paresthesias.  No acute events overnight per nursing staff.  Discharge Exam: Vitals:   02/01/21 2149 02/02/21 0554  BP: (!) 141/53 (!) 143/57  Pulse: 62 61  Resp: 20 20  Temp: 99.3 F (37.4 C) 98.7 F (37.1 C)  SpO2: 93% 95%   Vitals:   02/01/21 0555 02/01/21 1328 02/01/21 2149 02/02/21 0554  BP: (!) 159/67  139/66 (!) 141/53 (!) 143/57  Pulse: 61 (!) 59 62 61  Resp: '18 16 20 20  ' Temp: 97.6 F (36.4 C) 97.6 F (36.4 C) 99.3 F (37.4 C) 98.7 F (37.1 C)  TempSrc:  Oral Oral Oral  SpO2: 96% 95% 93% 95%  Weight:      Height:        General: Pt is alert, awake, not in acute distress Cardiovascular: RRR, S1/S2 +, no rubs, no gallops Respiratory: CTA bilaterally, no wheezing, no rhonchi, on room air Abdominal: Soft, NT, ND, bowel sounds + Extremities: no edema, no cyanosis    The results of significant diagnostics from this hospitalization (including imaging, microbiology, ancillary and laboratory) are listed below for reference.     Microbiology: Recent Results (from the past 240 hour(s))  SARS CORONAVIRUS 2 (TAT 6-24 HRS) Nasopharyngeal Nasopharyngeal Swab     Status: None   Collection Time: 01/24/21  2:02 PM   Specimen: Nasopharyngeal Swab  Result Value Ref Range Status   SARS Coronavirus 2 NEGATIVE  NEGATIVE Final    Comment: (NOTE) SARS-CoV-2 target nucleic acids are NOT DETECTED.  The SARS-CoV-2 RNA is generally detectable in upper and lower respiratory specimens during the acute phase of infection. Negative results do not preclude SARS-CoV-2 infection, do not rule out co-infections with other pathogens, and should not be used as the sole basis for treatment or other patient management decisions. Negative results must be combined with clinical observations, patient history, and epidemiological information. The expected result is Negative.  Fact Sheet for Patients: SugarRoll.be  Fact Sheet for Healthcare Providers: https://www.woods-mathews.com/  This test is not yet approved or cleared by the [place] FDA and  has been authorized for detection and/or diagnosis of SARS-CoV-2 by FDA under an Emergency Use Authorization (EUA). This EUA will remain  in effect (meaning this test can be used) for the duration of the COVID-19  declaration under Se ction 564(b)(1) of the Act, 21 U.S.C. section 360bbb-3(b)(1), unless the authorization is terminated or revoked sooner.  Performed at [facility], [place] [address], [place], [place] [address]   Resp Panel by RT-PCR (Flu A&B, Covid) Nasopharyngeal Swab     Status: None   Collection Time: 01/28/21  7:23 PM   Specimen: Nasopharyngeal Swab; Nasopharyngeal(NP) swabs in vial transport medium  Result Value Ref Range Status   SARS Coronavirus 2 by RT PCR NEGATIVE NEGATIVE Final    Comment: (NOTE) SARS-CoV-2 target nucleic acids are NOT DETECTED.  The SARS-CoV-2 RNA is generally detectable in upper respiratory specimens during the acute phase of infection. The lowest concentration of SARS-CoV-2 viral copies this assay can detect is 138 copies/mL. A negative result does not preclude SARS-Cov-2 infection and should not be used as the sole basis for treatment or other patient management decisions. A negative result may occur with  improper specimen collection/handling, submission of specimen other than nasopharyngeal swab, presence of viral mutation(s) within the areas targeted by this assay, and inadequate number of viral copies(<138 copies/mL). A negative result must be combined with clinical observations, patient history, and epidemiological information. The expected result is Negative.  Fact Sheet for Patients:  EntrepreneurPulse.com.au  Fact Sheet for Healthcare Providers:  IncredibleEmployment.be  This test is no t yet approved or cleared by the [place] FDA and  has been authorized for detection and/or diagnosis of SARS-CoV-2 by FDA under an Emergency Use Authorization (EUA). This EUA will remain  in effect (meaning this test can be used) for the duration of the COVID-19 declaration under Section 564(b)(1) of the Act, 21 U.S.C.section 360bbb-3(b)(1), unless the authorization is terminated  or revoked sooner.        Influenza A by PCR NEGATIVE NEGATIVE Final   Influenza B by PCR NEGATIVE NEGATIVE Final    Comment: (NOTE) The Xpert Xpress SARS-CoV-2/FLU/RSV plus assay is intended as an aid in the diagnosis of influenza from Nasopharyngeal swab specimens and should not be used as a sole basis for treatment. Nasal washings and aspirates are unacceptable for Xpert Xpress SARS-CoV-2/FLU/RSV testing.  Fact Sheet for Patients: EntrepreneurPulse.com.au  Fact Sheet for Healthcare Providers: IncredibleEmployment.be  This test is not yet approved or cleared by the [place] FDA and has been authorized for detection and/or diagnosis of SARS-CoV-2 by FDA under an Emergency Use Authorization (EUA). This EUA will remain in effect (meaning this test can be used) for the duration of the COVID-19 declaration under Section 564(b)(1) of the Act, 21 U.S.C. section 360bbb-3(b)(1), unless the authorization is terminated or revoked.  Performed at [facility], [place] [address] [place], [place] [address]   Urine Culture     Status: Abnormal (Preliminary result)   Collection Time: 01/31/21 12:49 PM   Specimen: Urine, Clean Catch  Result Value Ref Range Status   Specimen Description   Final    URINE, CLEAN CATCH Performed at [facility], [place] [address], [place], [place] [address]    Special Requests   Final    NONE Performed at [facility], [place] [address], [place], [place] [address]    Culture (A)  Final    >=100,000 COLONIES/mL KLEBSIELLA OXYTOCA SUSCEPTIBILITIES TO FOLLOW Performed at [facility], [place] [address] [place], [place] [address]    Report Status PENDING  Incomplete     Labs: BNP (last 3 results) Recent Labs    01/29/21 0015 01/31/21 0501  BNP 453.4* 384.6*   Basic Metabolic Panel: Recent Labs  Lab 01/29/21 0125 01/30/21 0417 01/31/21 0457 02/01/21 0450 02/02/21 0501  NA 132*  132* 133* 137 135  K 4.0 3.7 4.1 4.0 4.0  CL 97* 95* 95* 98 98  CO2 '25 28 28 24 26  ' GLUCOSE 109* 124* 144* 110* 118*  BUN 21 22 29* 24* 17  CREATININE 1.08* 1.10* 2.07* 1.39* 0.91  CALCIUM 8.8* 8.5* 8.6* 9.0 8.4*  MG 1.8  --  2.0 2.0  --   PHOS  --   --  4.0 3.5  --    Liver Function Tests: Recent Labs  Lab 01/28/21 1923 01/30/21 0417 01/31/21 0457 02/01/21 0450  AST 59* 33 20  --   ALT 80* 57* 42  --   ALKPHOS 134* 188* 159*  --   BILITOT 1.2 0.9 0.9  --   PROT 6.6 5.7* 5.8*  --   ALBUMIN 3.6 2.9* 3.0* 3.1*   Recent Labs  Lab 01/28/21 1923  LIPASE 25   No results for input(s): AMMONIA in the last 168 hours. CBC: Recent Labs  Lab 01/28/21 1923 01/29/21 0125 01/30/21 0417 01/31/21 0457 02/01/21 0450  WBC 9.4 8.5 6.2 8.4 7.5  NEUTROABS 7.7  --   --   --   --   HGB 11.9* 11.6* 11.2* 10.9* 11.6*  HCT 36.0 35.0* 33.7* 33.1* 35.9*  MCV 94.0 92.8 93.4 93.8 93.0  PLT 143* 139* 144* 157 186   Cardiac Enzymes: Recent Labs  Lab 01/31/21 0457  CKTOTAL 30*   BNP: Invalid input(s): POCBNP CBG: Recent Labs  Lab 02/01/21 0724 02/01/21 1153 02/01/21 1718 02/01/21 2146 02/02/21 0730  GLUCAP 113* 218* 89 116* 106*   D-Dimer No results for input(s): DDIMER in the last 72 hours. Hgb A1c No results for input(s): HGBA1C in the last 72 hours. Lipid Profile No results for input(s): CHOL, HDL, LDLCALC, TRIG, CHOLHDL, LDLDIRECT in the last 72 hours. Thyroid function studies Recent Labs    01/31/21 0501  TSH 2.166   Anemia work up Recent Labs    02/01/21 0450  VITAMINB12 920*  FOLATE 20.0  FERRITIN 221  TIBC 238*  IRON 22*  RETICCTPCT 2.5   Urinalysis    Component Value Date/Time   COLORURINE STRAW (A) 01/31/2021 1249   APPEARANCEUR TURBID (A) 01/31/2021 1249   LABSPEC 1.014 01/31/2021 1249   PHURINE 5.0 01/31/2021 [place] 01/31/2021 1249   GLUCOSEU NEGATIVE 09/21/2016 1103   HGBUR SMALL (A) 01/31/2021 1249   BILIRUBINUR NEGATIVE  01/31/2021 1249   BILIRUBINUR neg 11/24/2020 [place] 01/31/2021 1249  PROTEINUR 100 (A) 01/31/2021 1249   UROBILINOGEN 0.2 11/24/2020 1204   UROBILINOGEN 0.2 09/21/2016 1103   NITRITE NEGATIVE 01/31/2021 1249   LEUKOCYTESUR MODERATE (A) 01/31/2021 1249   Sepsis Labs Invalid input(s): PROCALCITONIN,  WBC,  LACTICIDVEN Microbiology Recent Results (from the past 240 hour(s))  SARS CORONAVIRUS 2 (TAT 6-24 HRS) Nasopharyngeal Nasopharyngeal Swab     Status: None   Collection Time: 01/24/21  2:02 PM   Specimen: Nasopharyngeal Swab  Result Value Ref Range Status   SARS Coronavirus 2 NEGATIVE NEGATIVE Final    Comment: (NOTE) SARS-CoV-2 target nucleic acids are NOT DETECTED.  The SARS-CoV-2 RNA is generally detectable in upper and lower respiratory specimens during the acute phase of infection. Negative results do not preclude SARS-CoV-2 infection, do not rule out co-infections with other pathogens, and should not be used as the sole basis for treatment or other patient management decisions. Negative results must be combined with clinical observations, patient history, and epidemiological information. The expected result is Negative.  Fact Sheet for Patients: SugarRoll.be  Fact Sheet for Healthcare Providers: https://www.woods-mathews.com/  This test is not yet approved or cleared by the [place] FDA and  has been authorized for detection and/or diagnosis of SARS-CoV-2 by FDA under an Emergency Use Authorization (EUA). This EUA will remain  in effect (meaning this test can be used) for the duration of the COVID-19 declaration under Se ction 564(b)(1) of the Act, 21 U.S.C. section 360bbb-3(b)(1), unless the authorization is terminated or revoked sooner.  Performed at [facility], [place] [address] [place], [place] [address]   Resp Panel by RT-PCR (Flu A&B, Covid) Nasopharyngeal Swab     Status: None    Collection Time: 01/28/21  7:23 PM   Specimen: Nasopharyngeal Swab; Nasopharyngeal(NP) swabs in vial transport medium  Result Value Ref Range Status   SARS Coronavirus 2 by RT PCR NEGATIVE NEGATIVE Final    Comment: (NOTE) SARS-CoV-2 target nucleic acids are NOT DETECTED.  The SARS-CoV-2 RNA is generally detectable in upper respiratory specimens during the acute phase of infection. The lowest concentration of SARS-CoV-2 viral copies this assay can detect is 138 copies/mL. A negative result does not preclude SARS-Cov-2 infection and should not be used as the sole basis for treatment or other patient management decisions. A negative result may occur with  improper specimen collection/handling, submission of specimen other than nasopharyngeal swab, presence of viral mutation(s) within the areas targeted by this assay, and inadequate number of viral copies(<138 copies/mL). A negative result must be combined with clinical observations, patient history, and epidemiological information. The expected result is Negative.  Fact Sheet for Patients:  EntrepreneurPulse.com.au  Fact Sheet for Healthcare Providers:  IncredibleEmployment.be  This test is no t yet approved or cleared by the [place] FDA and  has been authorized for detection and/or diagnosis of SARS-CoV-2 by FDA under an Emergency Use Authorization (EUA). This EUA will remain  in effect (meaning this test can be used) for the duration of the COVID-19 declaration under Section 564(b)(1) of the Act, 21 U.S.C.section 360bbb-3(b)(1), unless the authorization is terminated  or revoked sooner.       Influenza A by PCR NEGATIVE NEGATIVE Final   Influenza B by PCR NEGATIVE NEGATIVE Final    Comment: (NOTE) The Xpert Xpress SARS-CoV-2/FLU/RSV plus assay is intended as an aid in the diagnosis of influenza from Nasopharyngeal swab specimens and should not be used as a sole basis for treatment.  Nasal washings and aspirates are unacceptable for Xpert  Xpress SARS-CoV-2/FLU/RSV testing.  Fact Sheet for Patients: EntrepreneurPulse.com.au  Fact Sheet for Healthcare Providers: IncredibleEmployment.be  This test is not yet approved or cleared by the [place] FDA and has been authorized for detection and/or diagnosis of SARS-CoV-2 by FDA under an Emergency Use Authorization (EUA). This EUA will remain in effect (meaning this test can be used) for the duration of the COVID-19 declaration under Section 564(b)(1) of the Act, 21 U.S.C. section 360bbb-3(b)(1), unless the authorization is terminated or revoked.  Performed at [facility], [place] [address], [place], [place] [address]   Urine Culture     Status: Abnormal (Preliminary result)   Collection Time: 01/31/21 12:49 PM   Specimen: Urine, Clean Catch  Result Value Ref Range Status   Specimen Description   Final    URINE, CLEAN CATCH Performed at [facility], [place] [address], [place], [place] [address]    Special Requests   Final    NONE Performed at [facility], [place] [address] [place], [place] [address]    Culture (A)  Final    >=100,000 COLONIES/mL KLEBSIELLA OXYTOCA SUSCEPTIBILITIES TO FOLLOW Performed at [facility], [place] [address], [place],  [address]    Report Status PENDING  Incomplete     Time coordinating discharge: Over 30 minutes  SIGNED:   Alliene Klugh J [place] DO  [facility] 02/02/2021, 8:21 AM

## 2021-02-01 NOTE — Discharge Instructions (Signed)
Follow up with PCP in 1 week Follow-up with cardiology in 2-3 weeks Will need follow-up with [place] [facility] for irregularity findings on barium swallow Discontinued Aldactone and losartan Furosemide 20 mg p.o. daily as needed for weight gain greater than 3 pounds in 1 day or 5 pounds in 1 week Please obtain BMP in one week to assess renal function

## 2021-02-01 NOTE — Plan of Care (Signed)
  Problem: Health Behavior/Discharge Planning: Goal: Ability to manage health-related needs will improve Outcome: Progressing   Problem: Clinical Measurements: Goal: Diagnostic test results will improve Outcome: Progressing Goal: Respiratory complications will improve Outcome: Progressing   Problem: Nutrition: Goal: Adequate nutrition will be maintained Outcome: Progressing   

## 2021-02-01 NOTE — TOC Transition Note (Signed)
Transition of Care [facility]) - CM/SW Discharge Note   Patient Details  Name: Heather [name] MRN: [id number] Date of Birth: 08-28-1934  [facility] CM/SW Contact:  Dessa Phi, RN Phone Number: 02/01/2021, 11:51 AM   Clinical Narrative: Spoke to dtr Beverlee Nims about d/c plans-home w/HHPT-has no preference-[facility] rep cory able to accept. No further CM needs.      Final next level of care: [place] Barriers to Discharge: No Barriers Identified   Patient Goals and CMS Choice Patient states their goals for this hospitalization and ongoing recovery are:: go home CMS Medicare.gov Compare Post Acute Care list provided to:: Patient Represenative (must comment) Choice offered to / list presented to : Adult Children  Discharge Placement                       Discharge Plan and Services   Discharge Planning Services: CM Consult Post Acute Care Choice: Home Health                    HH Arranged: PT [place]: [place] Date [facility] Agency Contacted: 02/01/21 Time [place]: 1149 Representative spoke with at [place]: [place] Determinants of Health ([place]) Interventions     Readmission Risk Interventions No flowsheet data found.

## 2021-02-01 NOTE — Progress Notes (Addendum)
Subjective: CC: Doing well. No abdominal pain this morning. Denies n/v. Tolerating dys 3 diet without sensation of food getting stuck last night. Had esophagram yesterday. Working with therapies. Dysuria resolved. Still having some right calf pain.   Objective: Vital signs in last 24 hours: Temp:  [97.6 F (36.4 C)-98.1 F (36.7 C)] 97.6 F (36.4 C) (07/27 0555) Pulse Rate:  [61-70] 61 (07/27 0555) Resp:  [16-18] 18 (07/27 0555) BP: (111-159)/(47-81) 159/67 (07/27 0555) SpO2:  [94 %-98 %] 96 % (07/27 0555) Weight:  [72.5 kg] 72.5 kg (07/27 0437) Last BM Date: 02/01/21  Intake/Output from previous day: 07/26 0701 - 07/27 0700 In: 1853.3 [P.O.:560; I.V.:1293.3] Out: 620 [Urine:620] Intake/Output this shift: No intake/output data recorded.  PE: Gen:  Alert, NAD, pleasant, sitting up in chair  HEENT: EOM's intact, pupils equal and round Pulm: Normal rate and effort Abd: Soft, mild distension, appropriately tender around incision without peritonitis, +BS, laparoscopic incisions with glue intact appears well and are without drainage, bleeding, or signs of infection. Supraumbilical incision w/ small skin separation at the base without drainage or bleeding. No signs of infection. ? Ventral hernia superior to supraumbilical incision that is partially reducible Psych: A&Ox3  Ext: R calf tenderess with palpation. No ttp of the L calf. No LE edema. Skin: no rashes noted, warm and dry  Lab Results:  Recent Labs    01/31/21 0457 02/01/21 0450  WBC 8.4 7.5  HGB 10.9* 11.6*  HCT 33.1* 35.9*  PLT 157 186   BMET Recent Labs    01/31/21 0457 02/01/21 0450  NA 133* 137  K 4.1 4.0  CL 95* 98  CO2 28 24  GLUCOSE 144* 110*  BUN 29* 24*  CREATININE 2.07* 1.39*  CALCIUM 8.6* 9.0   PT/INR No results for input(s): LABPROT, INR in the last 72 hours. CMP     Component Value Date/Time   NA 137 02/01/2021 0450   NA 139 10/03/2020 1138   K 4.0 02/01/2021 0450   CL 98  02/01/2021 0450   CO2 24 02/01/2021 0450   GLUCOSE 110 (H) 02/01/2021 0450   BUN 24 (H) 02/01/2021 0450   BUN 23 10/03/2020 1138   CREATININE 1.39 (H) 02/01/2021 0450   CREATININE 0.95 11/10/2013 0328   CALCIUM 9.0 02/01/2021 0450   PROT 5.8 (L) 01/31/2021 0457   ALBUMIN 3.1 (L) 02/01/2021 0450   AST 20 01/31/2021 0457   ALT 42 01/31/2021 0457   ALKPHOS 159 (H) 01/31/2021 0457   BILITOT 0.9 01/31/2021 0457   GFRNONAA 37 (L) 02/01/2021 0450   GFRAA 57 (L) 08/19/2020 1351   Lipase     Component Value Date/Time   LIPASE 25 01/28/2021 1923    Studies/Results: DG Swallowing Func-Speech Pathology  Result Date: 01/30/2021 Formatting of this result is different from the original. Objective Swallowing Evaluation: Type of Study: MBS-Modified Barium Swallow Study  Patient Details Name: Heather [name] MRN: [id number] Date of Birth: [birth year] Today's Date: 01/30/2021 Time: SLP Start Time (ACUTE ONLY): 1325 -SLP Stop Time (ACUTE ONLY): 1350 SLP Time Calculation (min) (ACUTE ONLY): 25 min Past Medical History: Past Medical History: Diagnosis Date  Chronic stable angina ([place])   Coronary artery disease cardiologist--- dr Burt Knack  hx NSTEMI  w/ cardiac cath 03-16-2005 PCI with DES to LCx;   cardiac cath 06-27-2005  PCI with DES to RCA with residual dx LAD manage medcially;  lexiscan 01-26-20211 normal no ischemia, ef 70%;  stress echo w/ dobutamine 10-24-2011  negative ishcmeia , normal ef // Myoview 2/22: EF 67, no ischemia or infarction, no TID, low risk   Diabetes mellitus type 2, diet-controlled ([place])   followed by pcp  (10-19-2020  pt stated checks daily in am,  fasting blood surgar--- 115--120s)  DOE (dyspnea on exertion)   per pt "when I over do",  ok with household chores  Echocardiogram 08/2020   Echocardiogram 2/22: EF 55-60, no RWMA, mild LVH, Gr 2 DD, GLS-21.7%, normal RVSF, trivial MR, RVSP 39.5  Edema of right lower extremity   GERD (gastroesophageal reflux disease)   Hiatal hernia   recurrence,  hx  HH repair 1989  History of cervical cancer   s/p  vaginal hysterectomy  History of DVT of lower extremity 2016  11-29-2014 post op right TKA of right lower extremity and completed xarelto   History of esophageal stricture   hx s/p dilatation's  History of gastric ulcer 2005 approx.  History of palpitations 2010  event monitor 07-07-2009 showed NSR w/ freq. SVT ectopies with short runs, rare PVCs  History of TIA (transient ischemic attack) 06/1999  12-15-2019  per pt had several TIA between 12/ 2000 to 02/ 2001 , was sent to specialist '@[facility] , had test that was normal (10-19-2020 pt stated no TIAs since ) but has residual of essential tremors of right arm/ hand  Hypertension   Intermittent palpitations   IT band syndrome   Migraine   "ice pick headche lasts about 30 seconds"  Mixed hyperlipidemia   Mixed incontinence urge and stress   Multiple thyroid nodules   followed by pcp---   ultrasound 11-22-2014 no bx   (12-15-2019 per pt had a endocrinologist and was told did not need bx)  OA (osteoarthritis)   knees, elbow, hip, ankles  Occasional tremors   right arm/ hand  s/p TIA residual 2000  Osteoporosis   taking vitamin d  Peroneal DVT (deep venous thrombosis) ([place]) 12/22/2014  Right bundle branch block (RBBB) with left anterior fascicular block (LAFB)   RLS (restless legs syndrome)   S/P drug eluting coronary stent placement 2006  03-16-2005  PCI x1 DES to LCx;   06-27-2005  PCI x1 DES to RCA  Urinary retention   post op sling prodecure on 10-27-2020, has foley cathether Past Surgical History: Past Surgical History: Procedure Laterality Date  ANTERIOR AND POSTERIOR REPAIR N/A 12/22/2019  Procedure: ANTERIOR (CYSTOCELE)  REPAIR;  Surgeon: Janyth Contes, MD;  Location: [place];  Service: Gynecology;  Laterality: N/A;  ANTERIOR AND POSTERIOR REPAIR WITH SACROSPINOUS FIXATION N/A 10/27/2020  Procedure: SACROSPINOUS LIGAMENT FIXATION;  Surgeon: Jaquita Folds, MD;  Location: [facility];  Service: Gynecology;  Laterality: N/A;  BLADDER SUSPENSION N/A 10/27/2020  Procedure: TRANSVAGINAL TAPE (TVT) PROCEDURE;  Surgeon: Jaquita Folds, MD;  Location: [facility];  Service: Gynecology;  Laterality: N/A;  CATARACT EXTRACTION W/ INTRAOCULAR LENS  IMPLANT, BILATERAL  2015  CHOLECYSTECTOMY N/A 01/25/2021  Procedure: LAPAROSCOPIC CHOLECYSTECTOMY WITH INTRAOPERATIVE CHOLANGIOGRAM AND LYSIS OF ADHESIONS;  Surgeon: Jamesha Ellsworth Boston, MD;  Location: [facility];  Service: General;  Laterality: N/A;  COLONOSCOPY  last one ?  CORONARY ANGIOPLASTY WITH STENT PLACEMENT  03-16-2005   dr Lia Foyer  PCI and DES x1 to LCx  CORONARY ANGIOPLASTY WITH STENT PLACEMENT  06-27-2005  dr Lia Foyer  PCI and DES x1 to RCA with residual disease LAD 70-80% to manage medically  CYSTOSCOPY N/A 10/27/2020  Procedure: CYSTOSCOPY;  Surgeon: Jaquita Folds, MD;  Location: [place];  Service: Gynecology;  Laterality: N/A;  CYSTOSCOPY N/A 11/09/2020  Procedure: CYSTOSCOPY;  Surgeon: Jaquita Folds, MD;  Location: [facility];  Service: Gynecology;  Laterality: N/A;  FOOT SURGERY Left 1990s  left foot stress fracture repair, per pt no hardware  HIATAL HERNIA REPAIR  1989  INCISIONAL HERNIA REPAIR N/A 01/25/2021  Procedure: PRIMARY REPAIR OF INCISIONAL HERNIA;  Surgeon: Shauni Henner Boston, MD;  Location: [facility];  Service: General;  Laterality: N/A;  KNEE ARTHROSCOPY Bilateral right ?/   left x2 , last one 09-12-2009 @ [place]  PUBOVAGINAL SLING N/A 11/09/2020  Procedure: [place];  Surgeon: Jaquita Folds, MD;  Location: [facility];  Service: Gynecology;  Laterality: N/A;  RECTOCELE REPAIR N/A 04/20/2020  Procedure: POSTERIOR REPAIR (RECTOCELE);  Surgeon: Janyth Contes, MD;  Location: [facility];  Service: Gynecology;  Laterality: N/A;  RECTOCELE REPAIR N/A 10/27/2020  Procedure: POSTERIOR REPAIR (RECTOCELE);  Surgeon:  Jaquita Folds, MD;  Location: [facility];  Service: Gynecology;  Laterality: N/A;  total time requested for all procedures is 2 hours  TOTAL KNEE ARTHROPLASTY  11/12/2011  Procedure: TOTAL KNEE ARTHROPLASTY;  Surgeon: Lorn Junes, MD;  Location: [place];  Service: Orthopedics;  Laterality: Left;  Dr Noemi Chapel wants 90 minutes for this case  TOTAL KNEE ARTHROPLASTY Right 11/29/2014  Procedure: RIGHT TOTAL KNEE ARTHROPLASTY;  Surgeon: Vickey Huger, MD;  Location: [place];  Service: Orthopedics;  Laterality: Right;  UPPER GASTROINTESTINAL ENDOSCOPY  last one 04-25-2017  with dilatation esophageal stricture and savary dilatation  VAGINAL HYSTERECTOMY  1988   no ovaries removed for bleeeding HPI: Patient is an 86 y.o. female with PMH: DM-2, HTN, prior DVT post knee replacement, HFpEF (grade 2 DD in February 2022) who underwent a lap chole + hernia repair surgery on 7/20 and discharged 7/21. She returned to ED on 7/23 with c/o SOB with tightness, hypoxia, abdominal soreness, no BM since surgery, no nausea or vomiting. She is also c/o feeling of solid foods getting stuck when trying to swallow. CTA chest/abd/pelvis revealed mild pulmonary edema, fluid collection in gall bladder fossa, no large central PE. She was placed on a full liquids diet and SLP was ordered to assess swallow function.  Subjective: pleasant, alert sitting in radiology suite Assessment / Plan / Recommendation CHL IP CLINICAL IMPRESSIONS 01/30/2021 Clinical Impression Patient presents with a mild oropharyngeal dysphagia and with a suspected structural component with observed osteophytes and cricopharyngeal bar (no radiologist present to confirm) which slowed down bolus transit but did not fully impede and only trace to mild pharyngeal residuals observed post initiail swallows. During oral phase, patient exhibited brief oral holding of thin liquids prior to swallow, decreased mastication of regular solids and piecemeal swallowing with  puree solids and regular solids. Patient exhibited trace vallecular residuals after initial sips of thin liquids and trace to minimal vallecular residuals after initial bites of puree solids. Residuals all eventually cleared with subsequent swallows. She exhibited one instance of trace flash penetration of thin liquids via straw sips, but penetrate fully cleared. No aspiration observed and patient with good airway protection throughout. Barium tablet transit stopped briefly at approximately upper thoracic portion of esophagus when taken with plain, thin water, but puree solids (applesauce) helped to transit tablet fully. SLP spoke with patient and daughter after MBS to discuss nature of her swallow function, provide education regarding swallow safety. SLP recommended patient sit upright for meals (preferably in chair), fully  masticate solids, drink liquids throughout solids intake to keep boluses transiting through esophagus, eat/drink smaller meals. Patient and daughter both verbalized understanding. SLP Visit Diagnosis Dysphagia, oropharyngeal phase (R13.12) Attention and concentration deficit following -- Frontal lobe and executive function deficit following -- Impact on safety and function No limitations;Mild aspiration risk   CHL IP TREATMENT RECOMMENDATION 01/30/2021 Treatment Recommendations Therapy as outlined in treatment plan below   Prognosis 01/30/2021 Prognosis for Safe Diet Advancement Good Barriers to Reach Goals -- Barriers/Prognosis Comment -- CHL IP DIET RECOMMENDATION 01/30/2021 SLP Diet Recommendations Dysphagia 3 (Mech soft) solids;Thin liquid Liquid Administration via Cup;Straw Medication Administration Whole meds with puree Compensations Slow rate;Small sips/bites Postural Changes Seated upright at 90 degrees   CHL IP OTHER RECOMMENDATIONS 01/30/2021 Recommended Consults -- Oral Care Recommendations Oral care BID Other Recommendations --   CHL IP FOLLOW UP RECOMMENDATIONS 01/30/2021 Follow up  Recommendations None   CHL IP FREQUENCY AND DURATION 01/30/2021 Speech Therapy Frequency (ACUTE ONLY) min 1 x/week Treatment Duration 1 week      CHL IP ORAL PHASE 01/30/2021 Oral Phase Impaired Oral - Pudding Teaspoon -- Oral - Pudding Cup -- Oral - Honey Teaspoon -- Oral - Honey Cup -- Oral - Nectar Teaspoon -- Oral - Nectar Cup -- Oral - Nectar Straw -- Oral - Thin Teaspoon -- Oral - Thin Cup Other (Comment) Oral - Thin Straw Other (Comment) Oral - Puree Delayed oral transit;Piecemeal swallowing Oral - Mech Soft -- Oral - Regular Impaired mastication;Delayed oral transit;Piecemeal swallowing Oral - Multi-Consistency -- Oral - Pill Reduced posterior propulsion;Decreased bolus cohesion Oral Phase - Comment --  CHL IP PHARYNGEAL PHASE 01/30/2021 Pharyngeal Phase Impaired Pharyngeal- Pudding Teaspoon -- Pharyngeal -- Pharyngeal- Pudding Cup -- Pharyngeal -- Pharyngeal- Honey Teaspoon -- Pharyngeal -- Pharyngeal- Honey Cup -- Pharyngeal -- Pharyngeal- Nectar Teaspoon -- Pharyngeal -- Pharyngeal- Nectar Cup -- Pharyngeal -- Pharyngeal- Nectar Straw -- Pharyngeal -- Pharyngeal- Thin Teaspoon -- Pharyngeal -- Pharyngeal- Thin Cup Pharyngeal residue - valleculae;Pharyngeal residue - pyriform Pharyngeal -- Pharyngeal- Thin Straw Penetration/Aspiration during swallow;Pharyngeal residue - valleculae Pharyngeal Material enters airway, remains ABOVE vocal cords then ejected out Pharyngeal- Puree Pharyngeal residue - valleculae Pharyngeal -- Pharyngeal- Mechanical Soft -- Pharyngeal -- Pharyngeal- Regular Reduced pharyngeal peristalsis;Pharyngeal residue - valleculae Pharyngeal -- Pharyngeal- Multi-consistency -- Pharyngeal -- Pharyngeal- Pill Reduced pharyngeal peristalsis Pharyngeal -- Pharyngeal Comment --  CHL IP CERVICAL ESOPHAGEAL PHASE 01/30/2021 Cervical Esophageal Phase Impaired Pudding Teaspoon -- Pudding Cup -- Honey Teaspoon -- Honey Cup -- Nectar Teaspoon -- Nectar Cup -- Nectar Straw -- Thin Teaspoon -- Thin Cup  Prominent cricopharyngeal segment Thin Straw Prominent cricopharyngeal segment Puree Prominent cricopharyngeal segment Mechanical Soft -- Regular Prominent cricopharyngeal segment Multi-consistency -- Pill Prominent cricopharyngeal segment Cervical Esophageal Comment spinal curvature as well as appearance of suspected cervical osteophytes (no radiologist to confirm) and cricopharyngeal bar all contributed in slowing down but not impeding transit of boluses in upper esophagus. Sonia Baller, MA, CCC-SLP Speech Therapy              VAS [place] LOWER EXTREMITY VENOUS (DVT)  Result Date: 01/30/2021  Lower Venous DVT Study Patient Name:  Heather [name]  Date of Exam:   01/30/2021 Medical Rec #: [id number]       Accession #:    [id number] Date of Birth: 1934/12/19       Patient Gender: F Patient Age:   86Y Exam [facility]:  [facility] Procedure:      VAS [place] LOWER EXTREMITY VENOUS (DVT)  Referring Phys: [id number] [place] --------------------------------------------------------------------------------  Indications: Edema.  Risk Factors: None identified. Comparison Study: No prior studies. Performing Technologist: Oliver Hum RVT  Examination Guidelines: A complete evaluation includes B-mode imaging, spectral Doppler, color Doppler, and power Doppler as needed of all accessible portions of each vessel. Bilateral testing is considered an integral part of a complete examination. Limited examinations for reoccurring indications may be performed as noted. The reflux portion of the exam is performed with the patient in reverse Trendelenburg.  +---------+---------------+---------+-----------+----------+--------------+ RIGHT    CompressibilityPhasicitySpontaneityPropertiesThrombus Aging +---------+---------------+---------+-----------+----------+--------------+ CFV      Full           Yes      Yes                                  +---------+---------------+---------+-----------+----------+--------------+ SFJ      Full                                                        +---------+---------------+---------+-----------+----------+--------------+ FV Prox  Full                                                        +---------+---------------+---------+-----------+----------+--------------+ FV Mid   Full                                                        +---------+---------------+---------+-----------+----------+--------------+ FV DistalFull                                                        +---------+---------------+---------+-----------+----------+--------------+ PFV      Full                                                        +---------+---------------+---------+-----------+----------+--------------+ POP      Full           Yes      Yes                                 +---------+---------------+---------+-----------+----------+--------------+ PTV      Full                                                        +---------+---------------+---------+-----------+----------+--------------+ PERO     Full                                                        +---------+---------------+---------+-----------+----------+--------------+   +---------+---------------+---------+-----------+----------+--------------+  LEFT     CompressibilityPhasicitySpontaneityPropertiesThrombus Aging +---------+---------------+---------+-----------+----------+--------------+ CFV      Full           Yes      Yes                                 +---------+---------------+---------+-----------+----------+--------------+ SFJ      Full                                                        +---------+---------------+---------+-----------+----------+--------------+ FV Prox  Full                                                         +---------+---------------+---------+-----------+----------+--------------+ FV Mid   Full                                                        +---------+---------------+---------+-----------+----------+--------------+ FV DistalFull                                                        +---------+---------------+---------+-----------+----------+--------------+ PFV      Full                                                        +---------+---------------+---------+-----------+----------+--------------+ POP      Full           Yes      Yes                                 +---------+---------------+---------+-----------+----------+--------------+ PTV      Full                                                        +---------+---------------+---------+-----------+----------+--------------+ PERO     Full                                                        +---------+---------------+---------+-----------+----------+--------------+     Summary: RIGHT: - There is no evidence of deep vein thrombosis in the lower extremity.  - No cystic structure found in the popliteal fossa.  LEFT: - There is no evidence of deep vein thrombosis in the lower extremity.  - No  cystic structure found in the popliteal fossa.  *See table(s) above for measurements and observations. Electronically signed by Ruta Hinds MD on 01/30/2021 at 4:33:10 PM.    Final    DG ESOPHAGUS W SINGLE CM (SOL OR THIN BA)  Result Date: 01/31/2021 CLINICAL DATA:  Dysphagia R13.10 (ICD-10-CM). Additional history provided: Patient reports difficulty EXAM: ESOPHOGRAM/BARIUM SWALLOW TECHNIQUE: Single contrast examination was performed using thin barium. FLUOROSCOPY TIME:  Fluoroscopy Time:  1 minutes, 54 seconds. Radiation Exposure Index (if provided by the fluoroscopic device): 30.1 mGy. Number of Acquired Spot Images: 1 COMPARISON:  CT of the chest/abdomen/pelvis 01/28/2021. Report from esophagram 03/21/2000 (images  unavailable). FINDINGS: Somewhat limited examination due to the patient's limited ability to reposition. There is a small contour irregularity along the right lateral aspect of the mid-to-distal esophagus. Fluoroscopic evaluation otherwise demonstrates normal caliber and smooth contour of the esophagus. Prominent intermittent esophageal dysmotility with tertiary contractions. Redemonstrated moderate-sized hiatal hernia. Intermittent, and mildly delayed, passage of contrast from the hiatal hernia into the more distal stomach. The patient swallowed a 13 mm barium tablet, which freely passed into the hiatal hernia. However, the tablet did not pass into the more distal stomach despite a prolonged period of observation. No gastroesophageal reflux was observed. IMPRESSION: Somewhat limited examination due to the patient's limited ability to reposition. Small contour irregularity along the right lateral aspect of the mid-to-distal esophagus. Endoscopy is recommended to exclude a mass/lesion at this site. Redemonstrated moderate-sized hiatal hernia. Intermittent and mildly delayed passage of contrast from the hiatal hernia into the more distal stomach. A swallowed 13 mm barium tablet freely passed into the hiatal hernia. However, the tablet did not pass into the more distal stomach, despite a prolonged period of observation. Prominent intermittent esophageal dysmotility with tertiary contractions. Electronically Signed   By: Kellie Simmering DO   On: 01/31/2021 15:37    Anti-infectives: Anti-infectives (From admission, onward)    Start     Dose/Rate Route Frequency Ordered Stop   01/31/21 1400  cefTRIAXone (ROCEPHIN) 1 g in sodium chloride 0.9 % 100 mL IVPB        1 g 200 mL/hr over 30 Minutes Intravenous Every 24 hours 01/31/21 1316 02/05/21 1359        Assessment/Plan POD 7 s/p Laparoscopic cholecystectomy with IOC, Reduction and primary repair of incarcerated incisional hernias - Dr. Johney Maine - 01/30/2021 - IOC  negative. LFT's down on last check  - CT 7/23 w/ fluid in GB fossa. Cannot r/o infection but suspected to be post-operative changes given normal wbc on admission. Had a temp of 100.4 7/25-7/26. Suspect 2/2 UTI given UA results. Afebrile overnight. Will hold off on investigating fluid in GB fossa at this time given reassuring labs and exam - continue to monitor.  - Ventral hernia on CT noted containing fat. Appears she had swiss cheese deformity per Dr. Clyda Greener op note that was repaired primarily. No incarcerated bowel noted on CT. No indication for emergency surgery. - We will follow with you - Discharge planning per primary. Okay for discharge from our standpoint    FEN - Per speech - dysd3, bowel regimen VTE - SCDs, Lovenox ID - Rocephin for UTI Follow-Up - Dr. Johney Maine   Dysphagia - SLP following. On dys3 diet. Esophageal dysmotility on esophagram yesterday. Consider GI eval.  Hiatal hernia  RLE pain - LE [place] w/ no evidence of DVT CHF w/ pulm edema, b/l pleural effusions and elevated BNP - per TRH AKI - improving HTN DM2 CKD ?  UTI - UA appears suspicious for UTI. UCx pending. On abx.    LOS: 1 day    Jillyn Ledger , [facility] 02/01/2021, 9:50 AM Please see Amion for pager number during day hours 7:00am-4:30pm

## 2021-02-02 ENCOUNTER — Ambulatory Visit: Payer: Medicare HMO | Admitting: Family Medicine

## 2021-02-02 DIAGNOSIS — I5033 Acute on chronic diastolic (congestive) heart failure: Secondary | ICD-10-CM | POA: Diagnosis not present

## 2021-02-02 LAB — URINE CULTURE: Culture: >=100000 — AB

## 2021-02-02 LAB — GLUCOSE, CAPILLARY
Glucose-Capillary: 106 mg/dL — ABNORMAL HIGH (ref 70–99)
Glucose-Capillary: 114 mg/dL — ABNORMAL HIGH (ref 70–99)

## 2021-02-02 LAB — BASIC METABOLIC PANEL
Anion gap: 11 (ref 5–15)
BUN: 17 mg/dL (ref 8–23)
CO2: 26 mmol/L (ref 22–32)
Calcium: 8.4 mg/dL — ABNORMAL LOW (ref 8.9–10.3)
Chloride: 98 mmol/L (ref 98–111)
Creatinine, Ser: 0.91 mg/dL (ref 0.44–1.00)
GFR, Estimated: >60 mL/min (ref >60)
Glucose, Bld: 118 mg/dL — ABNORMAL HIGH (ref 70–99)
Potassium: 4 mmol/L (ref 3.5–5.1)
Sodium: 135 mmol/L (ref 135–145)

## 2021-02-02 NOTE — Plan of Care (Signed)
  Problem: Health Behavior/Discharge Planning: Goal: Ability to manage health-related needs will improve Outcome: Progressing   Problem: Activity: Goal: Risk for activity intolerance will decrease Outcome: Progressing   Problem: Nutrition: Goal: Adequate nutrition will be maintained Outcome: Progressing   Problem: Coping: Goal: Level of anxiety will decrease Outcome: Progressing   Problem: Pain Managment: Goal: General experience of comfort will improve Outcome: Progressing   Patient reports progressing all problem listed and have provide feedback to nurse about her problem.

## 2021-02-02 NOTE — TOC Transition Note (Signed)
Transition of Care [facility]) - CM/SW Discharge Note   Patient Details  Name: Heather [name] MRN: [id number] Date of Birth: [birth year]  Transition of Care [facility]) CM/SW Contact:  Dessa Phi, RN Phone Number: 02/02/2021, 9:54 AM   Clinical Narrative:  d/c today no changes from see prior note.     Final next level of care: [place] Barriers to Discharge: No Barriers Identified   Patient Goals and CMS Choice Patient states their goals for this hospitalization and ongoing recovery are:: go home CMS Medicare.gov Compare Post Acute Care list provided to:: Patient Represenative (must comment) Choice offered to / list presented to : Adult Children  Discharge Placement                       Discharge Plan and Services   Discharge Planning Services: CM Consult Post Acute Care Choice: Home Health                    HH Arranged: PT [place]: [place] Date [facility] Agency Contacted: 02/01/21 Time [place]: 1149 Representative spoke with at [place]: [place] Determinants of Health ([place]) Interventions     Readmission Risk Interventions No flowsheet data found.

## 2021-02-02 NOTE — Plan of Care (Signed)

## 2021-02-03 ENCOUNTER — Telehealth: Payer: Self-pay

## 2021-02-03 ENCOUNTER — Other Ambulatory Visit: Payer: Self-pay

## 2021-02-03 DIAGNOSIS — R739 Hyperglycemia, unspecified: Secondary | ICD-10-CM

## 2021-02-03 DIAGNOSIS — I10 Essential (primary) hypertension: Secondary | ICD-10-CM

## 2021-02-03 DIAGNOSIS — E1121 Type 2 diabetes mellitus with diabetic nephropathy: Secondary | ICD-10-CM

## 2021-02-03 NOTE — Telephone Encounter (Signed)
Pt's daughter called states that she needs a follow up with pcp. Pt's daughter didn't want to see any one but pcp. I told her that you would out the office next week and she would wait for appointment the week of the 12th. Can we get pt in sometime that week.

## 2021-02-03 NOTE — Addendum Note (Signed)
Addended by: Randolm Idol A on: 02/03/2021 01:23 PM   Modules accepted: Orders

## 2021-02-04 ENCOUNTER — Emergency Department (HOSPITAL_COMMUNITY): Payer: Medicare HMO

## 2021-02-04 ENCOUNTER — Inpatient Hospital Stay (HOSPITAL_COMMUNITY)
Admission: EM | Admit: 2021-02-04 | Discharge: 2021-02-10 | DRG: 862 | Disposition: A | Discharge status: Home Health | Payer: Medicare HMO | Attending: Internal Medicine | Admitting: Internal Medicine

## 2021-02-04 ENCOUNTER — Encounter (HOSPITAL_COMMUNITY): Payer: Self-pay | Admitting: Emergency Medicine

## 2021-02-04 DIAGNOSIS — I2510 Atherosclerotic heart disease of native coronary artery without angina pectoris: Secondary | ICD-10-CM | POA: Diagnosis present

## 2021-02-04 DIAGNOSIS — L03311 Cellulitis of abdominal wall: Secondary | ICD-10-CM | POA: Diagnosis not present

## 2021-02-04 DIAGNOSIS — G459 Transient cerebral ischemic attack, unspecified: Secondary | ICD-10-CM | POA: Diagnosis not present

## 2021-02-04 DIAGNOSIS — K219 Gastro-esophageal reflux disease without esophagitis: Secondary | ICD-10-CM | POA: Diagnosis present

## 2021-02-04 DIAGNOSIS — I5033 Acute on chronic diastolic (congestive) heart failure: Secondary | ICD-10-CM | POA: Diagnosis present

## 2021-02-04 DIAGNOSIS — M810 Age-related osteoporosis without current pathological fracture: Secondary | ICD-10-CM | POA: Diagnosis present

## 2021-02-04 DIAGNOSIS — I10 Essential (primary) hypertension: Secondary | ICD-10-CM | POA: Diagnosis present

## 2021-02-04 DIAGNOSIS — Z90710 Acquired absence of both cervix and uterus: Secondary | ICD-10-CM

## 2021-02-04 DIAGNOSIS — N390 Urinary tract infection, site not specified: Secondary | ICD-10-CM | POA: Diagnosis present

## 2021-02-04 DIAGNOSIS — I5032 Chronic diastolic (congestive) heart failure: Secondary | ICD-10-CM | POA: Diagnosis not present

## 2021-02-04 DIAGNOSIS — Z86718 Personal history of other venous thrombosis and embolism: Secondary | ICD-10-CM

## 2021-02-04 DIAGNOSIS — Z9842 Cataract extraction status, left eye: Secondary | ICD-10-CM

## 2021-02-04 DIAGNOSIS — K8010 Calculus of gallbladder with chronic cholecystitis without obstruction: Secondary | ICD-10-CM | POA: Diagnosis not present

## 2021-02-04 DIAGNOSIS — Z888 Allergy status to other drugs, medicaments and biological substances status: Secondary | ICD-10-CM

## 2021-02-04 DIAGNOSIS — N3289 Other specified disorders of bladder: Secondary | ICD-10-CM | POA: Diagnosis present

## 2021-02-04 DIAGNOSIS — I69398 Other sequelae of cerebral infarction: Secondary | ICD-10-CM | POA: Diagnosis not present

## 2021-02-04 DIAGNOSIS — R1010 Upper abdominal pain, unspecified: Secondary | ICD-10-CM | POA: Diagnosis not present

## 2021-02-04 DIAGNOSIS — E860 Dehydration: Secondary | ICD-10-CM | POA: Diagnosis present

## 2021-02-04 DIAGNOSIS — G250 Essential tremor: Secondary | ICD-10-CM | POA: Diagnosis present

## 2021-02-04 DIAGNOSIS — R918 Other nonspecific abnormal finding of lung field: Secondary | ICD-10-CM | POA: Diagnosis not present

## 2021-02-04 DIAGNOSIS — E119 Type 2 diabetes mellitus without complications: Secondary | ICD-10-CM

## 2021-02-04 DIAGNOSIS — I110 Hypertensive heart disease with heart failure: Secondary | ICD-10-CM | POA: Diagnosis present

## 2021-02-04 DIAGNOSIS — L0291 Cutaneous abscess, unspecified: Secondary | ICD-10-CM | POA: Diagnosis present

## 2021-02-04 DIAGNOSIS — K430 Incisional hernia with obstruction, without gangrene: Secondary | ICD-10-CM | POA: Diagnosis present

## 2021-02-04 DIAGNOSIS — A419 Sepsis, unspecified organism: Secondary | ICD-10-CM | POA: Diagnosis not present

## 2021-02-04 DIAGNOSIS — Z9049 Acquired absence of other specified parts of digestive tract: Secondary | ICD-10-CM

## 2021-02-04 DIAGNOSIS — R188 Other ascites: Secondary | ICD-10-CM

## 2021-02-04 DIAGNOSIS — G2581 Restless legs syndrome: Secondary | ICD-10-CM | POA: Diagnosis present

## 2021-02-04 DIAGNOSIS — Z20822 Contact with and (suspected) exposure to covid-19: Secondary | ICD-10-CM | POA: Diagnosis not present

## 2021-02-04 DIAGNOSIS — R109 Unspecified abdominal pain: Secondary | ICD-10-CM | POA: Diagnosis not present

## 2021-02-04 DIAGNOSIS — B961 Klebsiella pneumoniae [K. pneumoniae] as the cause of diseases classified elsewhere: Secondary | ICD-10-CM | POA: Diagnosis not present

## 2021-02-04 DIAGNOSIS — Z22321 Carrier or suspected carrier of Methicillin susceptible Staphylococcus aureus: Secondary | ICD-10-CM | POA: Diagnosis not present

## 2021-02-04 DIAGNOSIS — G319 Degenerative disease of nervous system, unspecified: Secondary | ICD-10-CM | POA: Diagnosis not present

## 2021-02-04 DIAGNOSIS — Z96651 Presence of right artificial knee joint: Secondary | ICD-10-CM | POA: Diagnosis present

## 2021-02-04 DIAGNOSIS — T8143XA Infection following a procedure, organ and space surgical site, initial encounter: Secondary | ICD-10-CM | POA: Diagnosis not present

## 2021-02-04 DIAGNOSIS — E782 Mixed hyperlipidemia: Secondary | ICD-10-CM | POA: Diagnosis present

## 2021-02-04 DIAGNOSIS — Z79899 Other long term (current) drug therapy: Secondary | ICD-10-CM

## 2021-02-04 DIAGNOSIS — Z7982 Long term (current) use of aspirin: Secondary | ICD-10-CM

## 2021-02-04 DIAGNOSIS — H43812 Vitreous degeneration, left eye: Secondary | ICD-10-CM | POA: Diagnosis not present

## 2021-02-04 DIAGNOSIS — N179 Acute kidney failure, unspecified: Secondary | ICD-10-CM | POA: Diagnosis not present

## 2021-02-04 DIAGNOSIS — R0602 Shortness of breath: Secondary | ICD-10-CM | POA: Diagnosis not present

## 2021-02-04 DIAGNOSIS — Z961 Presence of intraocular lens: Secondary | ICD-10-CM | POA: Diagnosis present

## 2021-02-04 DIAGNOSIS — R509 Fever, unspecified: Secondary | ICD-10-CM

## 2021-02-04 DIAGNOSIS — T8141XA Infection following a procedure, superficial incisional surgical site, initial encounter: Principal | ICD-10-CM | POA: Diagnosis present

## 2021-02-04 DIAGNOSIS — Y838 Other surgical procedures as the cause of abnormal reaction of the patient, or of later complication, without mention of misadventure at the time of the procedure: Secondary | ICD-10-CM | POA: Diagnosis present

## 2021-02-04 DIAGNOSIS — Z833 Family history of diabetes mellitus: Secondary | ICD-10-CM

## 2021-02-04 DIAGNOSIS — Z823 Family history of stroke: Secondary | ICD-10-CM | POA: Diagnosis not present

## 2021-02-04 DIAGNOSIS — L0390 Cellulitis, unspecified: Secondary | ICD-10-CM | POA: Diagnosis not present

## 2021-02-04 DIAGNOSIS — H43399 Other vitreous opacities, unspecified eye: Secondary | ICD-10-CM | POA: Diagnosis not present

## 2021-02-04 DIAGNOSIS — Z8249 Family history of ischemic heart disease and other diseases of the circulatory system: Secondary | ICD-10-CM

## 2021-02-04 DIAGNOSIS — Z881 Allergy status to other antibiotic agents status: Secondary | ICD-10-CM

## 2021-02-04 DIAGNOSIS — Z8711 Personal history of peptic ulcer disease: Secondary | ICD-10-CM

## 2021-02-04 DIAGNOSIS — Z9841 Cataract extraction status, right eye: Secondary | ICD-10-CM

## 2021-02-04 DIAGNOSIS — E871 Hypo-osmolality and hyponatremia: Secondary | ICD-10-CM | POA: Diagnosis present

## 2021-02-04 DIAGNOSIS — Z955 Presence of coronary angioplasty implant and graft: Secondary | ICD-10-CM

## 2021-02-04 DIAGNOSIS — J90 Pleural effusion, not elsewhere classified: Secondary | ICD-10-CM | POA: Diagnosis not present

## 2021-02-04 DIAGNOSIS — I5030 Unspecified diastolic (congestive) heart failure: Secondary | ICD-10-CM

## 2021-02-04 DIAGNOSIS — Z8541 Personal history of malignant neoplasm of cervix uteri: Secondary | ICD-10-CM

## 2021-02-04 LAB — CBC WITH DIFFERENTIAL/PLATELET
Abs Immature Granulocytes: 0.11 10*3/uL — ABNORMAL HIGH (ref 0.00–0.07)
Basophils Absolute: 0.1 10*3/uL (ref 0.0–0.1)
Basophils Relative: 0 %
Eosinophils Absolute: 0 10*3/uL (ref 0.0–0.5)
Eosinophils Relative: 0 %
HCT: 36.3 % (ref 36.0–46.0)
Hemoglobin: 11.9 g/dL — ABNORMAL LOW (ref 12.0–15.0)
Immature Granulocytes: 1 %
Lymphocytes Relative: 3 %
Lymphs Abs: 0.3 10*3/uL — ABNORMAL LOW (ref 0.7–4.0)
MCH: 30.4 pg (ref 26.0–34.0)
MCHC: 32.8 g/dL (ref 30.0–36.0)
MCV: 92.6 fL (ref 80.0–100.0)
Monocytes Absolute: 0.1 10*3/uL (ref 0.1–1.0)
Monocytes Relative: 0 %
Neutro Abs: 11.9 10*3/uL — ABNORMAL HIGH (ref 1.7–7.7)
Neutrophils Relative %: 96 %
Platelets: 291 10*3/uL (ref 150–400)
RBC: 3.92 MIL/uL (ref 3.87–5.11)
RDW: 12.6 % (ref 11.5–15.5)
WBC: 12.4 10*3/uL — ABNORMAL HIGH (ref 4.0–10.5)
nRBC: 0 % (ref 0.0–0.2)

## 2021-02-04 LAB — COMPREHENSIVE METABOLIC PANEL
ALT: 27 U/L (ref 0–44)
AST: 28 U/L (ref 15–41)
Albumin: 3.1 g/dL — ABNORMAL LOW (ref 3.5–5.0)
Alkaline Phosphatase: 164 U/L — ABNORMAL HIGH (ref 38–126)
Anion gap: 9 (ref 5–15)
BUN: 19 mg/dL (ref 8–23)
CO2: 23 mmol/L (ref 22–32)
Calcium: 8.7 mg/dL — ABNORMAL LOW (ref 8.9–10.3)
Chloride: 99 mmol/L (ref 98–111)
Creatinine, Ser: 1.44 mg/dL — ABNORMAL HIGH (ref 0.44–1.00)
GFR, Estimated: 36 mL/min — ABNORMAL LOW (ref >60)
Glucose, Bld: 118 mg/dL — ABNORMAL HIGH (ref 70–99)
Potassium: 4.6 mmol/L (ref 3.5–5.1)
Sodium: 131 mmol/L — ABNORMAL LOW (ref 135–145)
Total Bilirubin: 0.6 mg/dL (ref 0.3–1.2)
Total Protein: 6.6 g/dL (ref 6.5–8.1)

## 2021-02-04 LAB — LACTIC ACID, PLASMA: Lactic Acid, Venous: 1.7 mmol/L (ref 0.5–1.9)

## 2021-02-04 LAB — RESP PANEL BY RT-PCR (FLU A&B, COVID) ARPGX2
Influenza A by PCR: NEGATIVE
Influenza B by PCR: NEGATIVE
SARS Coronavirus 2 by RT PCR: NEGATIVE

## 2021-02-04 MED ORDER — ONDANSETRON HCL 4 MG/2ML IJ SOLN
4.0000 mg | Freq: Once | INTRAMUSCULAR | Status: AC
  Administered 2021-02-04: 4 mg via INTRAVENOUS
  Filled 2021-02-04: qty 2

## 2021-02-04 MED ORDER — FENTANYL CITRATE (PF) 100 MCG/2ML IJ SOLN
50.0000 ug | Freq: Once | INTRAMUSCULAR | Status: AC
  Administered 2021-02-04: 50 ug via INTRAVENOUS
  Filled 2021-02-04: qty 2

## 2021-02-04 MED ORDER — LIDOCAINE-EPINEPHRINE (PF) 2 %-1:200000 IJ SOLN
INTRAMUSCULAR | Status: AC
  Filled 2021-02-04: qty 20

## 2021-02-04 MED ORDER — ACETAMINOPHEN 500 MG PO TABS
1000.0000 mg | ORAL_TABLET | Freq: Once | ORAL | Status: AC
  Administered 2021-02-05: 1000 mg via ORAL
  Filled 2021-02-04: qty 2

## 2021-02-04 MED ORDER — IOHEXOL 350 MG/ML SOLN
80.0000 mL | Freq: Once | INTRAVENOUS | Status: AC | PRN
  Administered 2021-02-04: 80 mL via INTRAVENOUS

## 2021-02-04 NOTE — Consult Note (Addendum)
COREY ZULEGER 10-Aug-1934  [id number].    Chief Complaint/Reason for Consult: fevers, wound infection  HPI:  Ms. Benally is an 86 yo female who recently underwent laparoscopic cholecystectomy with primary repair of an incisional hernia on 7/20 by Dr. Johney Maine. She was discharged home on POD1 but readmitted on 7/23 with acute on chronic diastolic CHF. She was diuresed during her admission and started on antibiotics for a UTI. She had a CT scan 7/23 that showed postoperative fluid in the gallbladder fossa but no other acute postop intraabdominal issues.  Yesterday she was seen in urgent clinic with a wound infection. She was noted to have cellulitis at her umbilical incision and was started on Bactrim. This afternoon she started having chills and shaking, and was febrile at home to 101.7. She also had subjective shortness of breath, and reported worsening redness and pain at her incision. She called our office and was instructed to come to the ED. Tmax in the ED is 37.7 and she is hemodynamically stable. WBC is 12, remaining labs are pending. Her daughter reports that since surgery her appetite is been poor. She has not had any nausea or vomiting today.  ROS: Review of Systems  Constitutional:  Negative for chills and fever.  Respiratory:  Positive for shortness of breath.   Cardiovascular:  Negative for chest pain.  Gastrointestinal:  Negative for nausea and vomiting.   Family History  Problem Relation Age of Onset   Stroke Father        family hx of M 1st degree relative <50   Coronary artery disease Mother    Heart disease Mother    Depression Brother    Stroke Brother    Diabetes Brother    Cancer Brother        bladder with mets   Diabetes Daughter        borderline   Hypertension Daughter    Arthritis Other        family hx of   Hypertension Other        family hx of   Other Other        family hx of cardiovascular disorder   Thyroid disease Daughter    Breast cancer Neg  Hx    Colon cancer Neg Hx    Anesthesia problems Neg Hx    Hypotension Neg Hx    Malignant hyperthermia Neg Hx    Pseudochol deficiency Neg Hx    Colon polyps Neg Hx    Esophageal cancer Neg Hx    Rectal cancer Neg Hx    Stomach cancer Neg Hx     Past Medical History:  Diagnosis Date   Chronic stable angina ([place])    Coronary artery disease cardiologist--- dr Burt Knack   hx NSTEMI  w/ cardiac cath 03-16-2005 PCI with DES to LCx;   cardiac cath 06-27-2005  PCI with DES to RCA with residual dx LAD manage medcially;  lexiscan 01-26-20211 normal no ischemia, ef 70%;  stress echo w/ dobutamine 10-24-2011 negative ishcmeia , normal ef // Myoview 2/22: EF 70, no ischemia or infarction, no TID, low risk    Diabetes mellitus type 2, diet-controlled ([place])    followed by pcp  (10-19-2020  pt stated checks daily in am,  fasting blood surgar--- 115--120s)   DOE (dyspnea on exertion)    per pt "when I over do",  ok with household chores   Echocardiogram 08/2020    Echocardiogram 2/22: EF 55-60, no RWMA, mild LVH,  Gr 2 DD, GLS-21.7%, normal RVSF, trivial MR, RVSP 39.5   Edema of right lower extremity    GERD (gastroesophageal reflux disease)    Hiatal hernia    recurrence,  hx HH repair 1989   History of cervical cancer    s/p  vaginal hysterectomy   History of DVT of lower extremity 2016   11-29-2014 post op right TKA of right lower extremity and completed xarelto    History of esophageal stricture    hx s/p dilatation's   History of gastric ulcer 2005 approx.   History of palpitations 2010   event monitor 07-07-2009 showed NSR w/ freq. SVT ectopies with short runs, rare PVCs   History of TIA (transient ischemic attack) 06/1999   12-15-2019  per pt had several TIA between 12/ 2000 to 02/ 2001 , was sent to specialist '@[facility] , had test that was normal (10-19-2020 pt stated no TIAs since ) but has residual of essential tremors of right arm/ hand   Hypertension    Intermittent palpitations    IT band  syndrome    Migraine    "ice pick headche lasts about 30 seconds"   Mixed hyperlipidemia    Mixed incontinence urge and stress    Multiple thyroid nodules    followed by pcp---   ultrasound 11-22-2014 no bx   (12-15-2019 per pt had a endocrinologist and was told did not need bx)   OA (osteoarthritis)    knees, elbow, hip, ankles   Occasional tremors    right arm/ hand  s/p TIA residual 2000   Osteoporosis    taking vitamin d   Peroneal DVT (deep venous thrombosis) ([place]) 12/22/2014   Right bundle branch block (RBBB) with left anterior fascicular block (LAFB)    RLS (restless legs syndrome)    S/P drug eluting coronary stent placement 2006   03-16-2005  PCI x1 DES to LCx;   06-27-2005  PCI x1 DES to RCA   Urinary retention    post op sling prodecure on 10-27-2020, has foley cathether    Past Surgical History:  Procedure Laterality Date   ANTERIOR AND POSTERIOR REPAIR N/A 12/22/2019   Procedure: ANTERIOR (CYSTOCELE)  REPAIR;  Surgeon: Janyth Contes, MD;  Location: [facility];  Service: Gynecology;  Laterality: N/A;   ANTERIOR AND POSTERIOR REPAIR WITH SACROSPINOUS FIXATION N/A 10/27/2020   Procedure: SACROSPINOUS LIGAMENT FIXATION;  Surgeon: Jaquita Folds, MD;  Location: [facility];  Service: Gynecology;  Laterality: N/A;   BLADDER SUSPENSION N/A 10/27/2020   Procedure: TRANSVAGINAL TAPE (TVT) PROCEDURE;  Surgeon: Jaquita Folds, MD;  Location: [facility];  Service: Gynecology;  Laterality: N/A;   CATARACT EXTRACTION W/ INTRAOCULAR LENS  IMPLANT, BILATERAL  2015   CHOLECYSTECTOMY N/A 01/25/2021   Procedure: LAPAROSCOPIC CHOLECYSTECTOMY WITH INTRAOPERATIVE CHOLANGIOGRAM AND LYSIS OF ADHESIONS;  Surgeon: Michael Boston, MD;  Location: [facility];  Service: General;  Laterality: N/A;   COLONOSCOPY  last one ?   CORONARY ANGIOPLASTY WITH STENT PLACEMENT  03-16-2005   dr Lia Foyer   PCI and DES x1 to LCx   CORONARY ANGIOPLASTY  WITH STENT PLACEMENT  06-27-2005  dr Lia Foyer   PCI and DES x1 to RCA with residual disease LAD 70-80% to manage medically   CYSTOSCOPY N/A 10/27/2020   Procedure: CYSTOSCOPY;  Surgeon: Jaquita Folds, MD;  Location: [facility];  Service: Gynecology;  Laterality: N/A;   CYSTOSCOPY N/A 11/09/2020   Procedure: CYSTOSCOPY;  Surgeon: Sherlene Shams  N, MD;  Location: [place];  Service: Gynecology;  Laterality: N/A;   FOOT SURGERY Left 1990s   left foot stress fracture repair, per pt no hardware   HIATAL HERNIA REPAIR  1989   INCISIONAL HERNIA REPAIR N/A 01/25/2021   Procedure: PRIMARY REPAIR OF INCISIONAL HERNIA;  Surgeon: Michael Boston, MD;  Location: [facility];  Service: General;  Laterality: N/A;   KNEE ARTHROSCOPY Bilateral right ?/   left x2 , last one 09-12-2009 @ [place]   PUBOVAGINAL SLING N/A 11/09/2020   Procedure: [place];  Surgeon: Jaquita Folds, MD;  Location: [facility];  Service: Gynecology;  Laterality: N/A;   RECTOCELE REPAIR N/A 04/20/2020   Procedure: POSTERIOR REPAIR (RECTOCELE);  Surgeon: Janyth Contes, MD;  [facility];  Service: Gynecology;  Laterality: N/A;   RECTOCELE REPAIR N/A 10/27/2020   Procedure: POSTERIOR REPAIR (RECTOCELE);  Surgeon: Jaquita Folds, MD;  Location: [facility];  Service: Gynecology;  Laterality: N/A;  total time requested for all procedures is 2 hours   TOTAL KNEE ARTHROPLASTY  11/12/2011   Procedure: TOTAL KNEE ARTHROPLASTY;  Surgeon: Lorn Junes, MD;  Location: [place];  Service: Orthopedics;  Laterality: Left;  Dr Noemi Chapel wants 90 minutes for this case   TOTAL KNEE ARTHROPLASTY Right 11/29/2014   Procedure: RIGHT TOTAL KNEE ARTHROPLASTY;  Surgeon: Vickey Huger, MD;  Location: [place];  Service: Orthopedics;  Laterality: Right;   UPPER GASTROINTESTINAL ENDOSCOPY  last one 04-25-2017   with dilatation esophageal stricture  and savary dilatation   VAGINAL HYSTERECTOMY  1988    no ovaries removed for bleeeding    Social History:  reports that she has never smoked. She has never used smokeless tobacco. She reports that she does not drink alcohol and does not use drugs.  Allergies:  Allergies  Allergen Reactions   Baclofen Other (See Comments)    Hyperactivity    Nitrofurantoin Rash    (Not in a hospital admission)    Physical Exam: Blood pressure (!) 166/41, pulse 74, temperature 99.8 F (37.7 C), temperature source Oral, resp. rate 16, height '5\' 2"'$  (1.575 m), weight 68.9 kg, SpO2 96 %. General: resting comfortably, appears stated age, no apparent distress Neurological: alert and oriented, no focal deficits, cranial nerves grossly in tact HEENT: normocephalic, atraumatic, no scleral icterus CV: regular rate and rhythm, extremities warm and well-perfused Respiratory: normal work of breathing on nasal cannula, symmetric chest wall expansion Abdomen: soft, nondistended. Umbilical incision has surrounding erythema and induration with a small amount of drainage. Remaining and incisions are clean and dry with no signs of cellulitis. There is tenderness at the umbilicus but abdomen is otherwise soft and nontender. Extremities: warm and well-perfused, no deformities, moving all extremities spontaneously Psychiatric: normal mood and affect Skin: warm and dry, no jaundice, no rashes or lesions   Results for orders placed or performed during the hospital encounter of 02/04/21 (from the past 48 hour(s))  CBC with Differential     Status: Abnormal (Preliminary result)   Collection Time: 02/04/21  9:30 PM  Result Value Ref Range   WBC 12.4 (H) 4.0 - 10.5 K/uL   RBC 3.92 3.87 - 5.11 MIL/uL   Hemoglobin 11.9 (L) 12.0 - 15.0 g/dL   HCT 36.3 36.0 - 46.0 %   MCV 92.6 80.0 - 100.0 fL   MCH 30.4 26.0 - 34.0 pg   MCHC 32.8 30.0 - 36.0 g/dL   RDW 12.6 11.5 -  15.5 %   Platelets 291 150 - 400 K/uL   nRBC 0.0 0.0 -  0.2 %    Comment: Performed at [facility], [place] [address] [place], [place] [address]   Neutrophils Relative % PENDING %   Neutro Abs PENDING 1.7 - 7.7 K/uL   Band Neutrophils PENDING %   Lymphocytes Relative PENDING %   Lymphs Abs PENDING 0.7 - 4.0 K/uL   Monocytes Relative PENDING %   Monocytes Absolute PENDING 0.1 - 1.0 K/uL   Eosinophils Relative PENDING %   Eosinophils Absolute PENDING 0.0 - 0.5 K/uL   Basophils Relative PENDING %   Basophils Absolute PENDING 0.0 - 0.1 K/uL   WBC Morphology PENDING    RBC Morphology PENDING    Smear Review PENDING    Other PENDING %   nRBC PENDING 0 /100 WBC   Metamyelocytes Relative PENDING %   Myelocytes PENDING %   Promyelocytes Relative PENDING %   Blasts PENDING %   Immature Granulocytes PENDING %   Abs Immature Granulocytes PENDING 0.00 - 0.07 K/uL   DG Chest Portable 1 View  Result Date: 02/04/2021 CLINICAL DATA:  Shortness of breath. EXAM: PORTABLE CHEST 1 VIEW COMPARISON:  Radiograph and chest CT 01/28/2021. FINDINGS: Lower lung volumes from prior exam. Patient is rotated. Grossly stable heart size and mediastinal contours. Increasing right basilar opacity. Small pleural effusions. No pulmonary edema. No pneumothorax. IMPRESSION: 1. Lower lung volumes from prior exam. Increasing right basilar opacity, favor atelectasis over pneumonia. 2. Small pleural effusions. Electronically Signed   By: Keith Rake M.D.   On: 02/04/2021 21:44      Assessment/Plan 86 yo female 10 days s/p cholecystectomy with incisional hernia repair, presenting with fevers and a postoperative wound infect. Since her cellulitis did not improve after starting Bactrim and she has had fevers, this is suspicious for an undrained abscess. I performed an I&D of her umbilical incision at bedside and drained a moderate amount of turbid serous fluid, consistent with an infected seroma. Cellulitis should hopefully improve now that the fluid has been  drained. Her WBC is only mildly elevated, which can be explained by the cellulitis, and she is afebrile and nontoxic appearing in the ED. Her abdominal exam is otherwise benign so CT scan can be deferred for now. Sepsis workup is in progress to rule out other sources of fever. If this is unrevealing and patient is discharged home, she should complete her course of Bactrim started yesterday in clinic. We will see her in clinic this week for a wound check.  Wound care: Dry gauze dressing to umbilical wound, change BID and more often as needed if saturated. Daughter instructed in wound care.   Michaelle Birks, MD [facility] General, Hepatobiliary and Pancreatic Surgery 02/04/21 10:09 PM

## 2021-02-04 NOTE — ED Provider Notes (Signed)
[facility] DEPT Provider Note   CSN: [id number] Arrival date & time: 02/04/21  2035     History Chief Complaint  Patient presents with   Post-op Problem   Fever   Weakness    Heather [name] is a 86 y.o. female who is status postcholecystectomy on 01/25/2021 who presents for fever, chills, abdominal pain, drainage from woun.  She had a history of cholecystectomy done on 01/25/2021.  She was discharged home from the hospital.  She was on antibiotics for UTI which she has been taking.  She started noticing drainage, warmth, redness around the wound for last few days.  She went to the outpatient surgical clinic yesterday and was evaluated and started on Bactrim which she states she has taken 2 doses of.  Today, she started having chills, worsening pain, worsening drainage from the wound.  They measured her temperature at home and it was 101.7.  She has been weak, not had an appetite.  She reports abdominal bloating, pain.  She also was having some trouble breathing but daughter thinks that she was panicked because of the chills.  She has not noted any swelling of her legs.  Denies any chest pain, dysuria, hematuria.  The history is provided by the patient.      Past Medical History:  Diagnosis Date   Chronic stable angina [facility])    Coronary artery disease cardiologist--- dr Burt Knack   hx NSTEMI  w/ cardiac cath 03-16-2005 PCI with DES to LCx;   cardiac cath 06-27-2005  PCI with DES to RCA with residual dx LAD manage medcially;  lexiscan 01-26-20211 normal no ischemia, ef 70%;  stress echo w/ dobutamine 10-24-2011 negative ishcmeia , normal ef // Myoview 2/22: EF 67, no ischemia or infarction, no TID, low risk    Diabetes mellitus type 2, diet-controlled ([place])    followed by pcp  (10-19-2020  pt stated checks daily in am,  fasting blood surgar--- 115--120s)   DOE (dyspnea on exertion)    per pt "when I over do",  ok with household chores   Echocardiogram 08/2020     Echocardiogram 2/22: EF 55-60, no RWMA, mild LVH, Gr 2 DD, GLS-21.7%, normal RVSF, trivial MR, RVSP 39.5   Edema of right lower extremity    GERD (gastroesophageal reflux disease)    Hiatal hernia    recurrence,  hx HH repair 1989   History of cervical cancer    s/p  vaginal hysterectomy   History of DVT of lower extremity 2016   11-29-2014 post op right TKA of right lower extremity and completed xarelto    History of esophageal stricture    hx s/p dilatation's   History of gastric ulcer 2005 approx.   History of palpitations 2010   event monitor 07-07-2009 showed NSR w/ freq. SVT ectopies with short runs, rare PVCs   History of TIA (transient ischemic attack) 06/1999   12-15-2019  per pt had several TIA between 12/ 2000 to 02/ 2001 , was sent to specialist _0 , had test that was normal (10-19-2020 pt stated no TIAs since ) but has residual of essential tremors of right arm/ hand   Hypertension    Intermittent palpitations    IT band syndrome    Migraine    "ice pick headche lasts about 30 seconds"   Mixed hyperlipidemia    Mixed incontinence urge and stress    Multiple thyroid nodules    followed by pcp---   ultrasound 11-22-2014 no bx   (  12-15-2019 per pt had a endocrinologist and was told did not need bx)   OA (osteoarthritis)    knees, elbow, hip, ankles   Occasional tremors    right arm/ hand  s/p TIA residual 2000   Osteoporosis    taking vitamin d   Peroneal DVT (deep venous thrombosis) ([place]) 12/22/2014   Right bundle branch block (RBBB) with left anterior fascicular block (LAFB)    RLS (restless legs syndrome)    S/P drug eluting coronary stent placement 2006   03-16-2005  PCI x1 DES to LCx;   06-27-2005  PCI x1 DES to RCA   Urinary retention    post op sling prodecure on 10-27-2020, has foley cathether    Patient Active Problem List   Diagnosis Date Noted   Sepsis ([place]) 02/05/2021   AKI (acute kidney injury) ([place]) 02/05/2021   Chronic diastolic CHF (congestive  heart failure) ([place]) 02/05/2021   Sepsis due to cellulitis ([place]) 02/05/2021   UTI (urinary tract infection) 01/31/2021   Acute on chronic diastolic CHF (congestive heart failure) ([place]) 01/28/2021   Intra-abdominal fluid collection 01/28/2021   Incarcerated incisional hernia s/p primary repair 01/25/2021 01/25/2021   Stress reaction of bone 01/05/2021   Pain of right sternoclavicular joint 01/05/2021   Screening for osteoporosis 01/05/2021   RUQ pain 12/01/2020   Chronic calculous cholecystitis s/p lap cholecystectomy 01/25/2021 12/01/2020   Low back pain radiating to right leg 07/14/2020   Pelvic relaxation due to rectocele 04/06/2020   Prolapse of female pelvic organs 12/22/2019   Urinary incontinence, mixed 10/01/2019   Pelvic floor relaxation 06/08/2019   Deep venous thrombosis ([place]) 03/19/2019   Fatigue 03/19/2019   Goiter 03/19/2019   Transient ischemic attack 03/19/2019   Acute dermatitis 03/19/2019   Pelvic prolapse 03/08/2019   Pain of breast 01/29/2019   Overactive bladder 01/29/2019   Educated about COVID-19 virus infection 12/02/2018   Hyperlipidemia associated with type 2 diabetes mellitus ([place]) 05/27/2017   Headache 04/17/2015   Multinodular goiter 01/17/2015   S/P total knee arthroplasty 11/29/2014   Insomnia 02/05/2014   Preventative health care 11/22/2013   RLS (restless legs syndrome) 10/04/2013   Lower urinary tract infectious disease 10/04/2013   Cataracts, bilateral 04/02/2013   Hypokalemia 01/05/2012   Anemia 01/05/2012   Mixed anxiety and depressive disorder 01/05/2012   Postoperative anemia due to acute blood loss 11/15/2011   Staphylococcus aureus carrier 11/15/2011   Pre-syncope 10/13/2011   Hyperglycemia 10/07/2011   DM (diabetes mellitus) ([place]) 10/06/2011   Pulmonary nodule 10/06/2011   Epigastric pain 10/06/2011   It band syndrome, right 08/28/2011   Renal insufficiency 08/28/2011   CORONARY ATHEROSCLEROSIS NATIVE CORONARY ARTERY 03/07/2010    Coronary atherosclerosis 01/18/2009   FATIGUE 01/18/2009   PERSISTENT DISORDER INITIATING/MAINTAINING SLEEP 09/09/2008   DERMATITIS 10/16/2007   PERIPHERAL EDEMA 10/16/2007   Essential hypertension 04/14/2007   Gastroesophageal reflux disease 04/14/2007    Past Surgical History:  Procedure Laterality Date   ANTERIOR AND POSTERIOR REPAIR N/A 12/22/2019   Procedure: ANTERIOR (CYSTOCELE)  REPAIR;  Surgeon: Janyth Contes, MD;  Location: [facility];  Service: Gynecology;  Laterality: N/A;   ANTERIOR AND POSTERIOR REPAIR WITH SACROSPINOUS FIXATION N/A 10/27/2020   Procedure: SACROSPINOUS LIGAMENT FIXATION;  Surgeon: Jaquita Folds, MD;  Location: [facility];  Service: Gynecology;  Laterality: N/A;   BLADDER SUSPENSION N/A 10/27/2020   Procedure: TRANSVAGINAL TAPE (TVT) PROCEDURE;  Surgeon: Jaquita Folds, MD;  Location: [facility];  Service: Gynecology;  Laterality: N/A;   CATARACT EXTRACTION W/ INTRAOCULAR LENS  IMPLANT, BILATERAL  2015   CHOLECYSTECTOMY N/A 01/25/2021   Procedure: LAPAROSCOPIC CHOLECYSTECTOMY WITH INTRAOPERATIVE CHOLANGIOGRAM AND LYSIS OF ADHESIONS;  Surgeon: Michael Boston, MD;  Location: [facility];  Service: General;  Laterality: N/A;   COLONOSCOPY  last one ?   CORONARY ANGIOPLASTY WITH STENT PLACEMENT  03-16-2005   dr Lia Foyer   PCI and DES x1 to LCx   CORONARY ANGIOPLASTY WITH STENT PLACEMENT  06-27-2005  dr Lia Foyer   PCI and DES x1 to RCA with residual disease LAD 70-80% to manage medically   CYSTOSCOPY N/A 10/27/2020   Procedure: CYSTOSCOPY;  Surgeon: Jaquita Folds, MD;  Location: [facility];  Service: Gynecology;  Laterality: N/A;   CYSTOSCOPY N/A 11/09/2020   Procedure: CYSTOSCOPY;  Surgeon: Jaquita Folds, MD;  Location: [facility];  Service: Gynecology;  Laterality: N/A;   FOOT SURGERY Left 1990s   left foot stress fracture repair, per pt no hardware   HIATAL  HERNIA REPAIR  1989   INCISIONAL HERNIA REPAIR N/A 01/25/2021   Procedure: PRIMARY REPAIR OF INCISIONAL HERNIA;  Surgeon: Michael Boston, MD;  Location: [facility];  Service: General;  Laterality: N/A;   KNEE ARTHROSCOPY Bilateral right ?/   left x2 , last one 09-12-2009 @ [facility]   PUBOVAGINAL SLING N/A 11/09/2020   Procedure: [place];  Surgeon: Jaquita Folds, MD;  Location: [facility];  Service: Gynecology;  Laterality: N/A;   RECTOCELE REPAIR N/A 04/20/2020   Procedure: POSTERIOR REPAIR (RECTOCELE);  Surgeon: Janyth Contes, MD;  Location: [facility];  Service: Gynecology;  Laterality: N/A;   RECTOCELE REPAIR N/A 10/27/2020   Procedure: POSTERIOR REPAIR (RECTOCELE);  Surgeon: Jaquita Folds, MD;  Location: [facility];  Service: Gynecology;  Laterality: N/A;  total time requested for all procedures is 2 hours   TOTAL KNEE ARTHROPLASTY  11/12/2011   Procedure: TOTAL KNEE ARTHROPLASTY;  Surgeon: Lorn Junes, MD;  Location: [facility];  Service: Orthopedics;  Laterality: Left;  Dr Noemi Chapel wants 90 minutes for this case   TOTAL KNEE ARTHROPLASTY Right 11/29/2014   Procedure: RIGHT TOTAL KNEE ARTHROPLASTY;  Surgeon: Vickey Huger, MD;  Location: [facility];  Service: Orthopedics;  Laterality: Right;   UPPER GASTROINTESTINAL ENDOSCOPY  last one 04-25-2017   with dilatation esophageal stricture and savary dilatation   VAGINAL HYSTERECTOMY  1988    no ovaries removed for bleeeding     OB History     Gravida  4   Para  1   Term      Preterm  1   AB      Living  4      SAB      IAB      Ectopic      Multiple      Live Births              Family History  Problem Relation Age of Onset   Stroke Father        family hx of M 1st degree relative <50   Coronary artery disease Mother    Heart disease Mother    Depression Brother    Stroke Brother    Diabetes Brother    Cancer Brother        bladder with  mets   Diabetes Daughter        borderline   Hypertension Daughter  Arthritis Other        family hx of   Hypertension Other        family hx of   Other Other        family hx of cardiovascular disorder   Thyroid disease Daughter    Breast cancer Neg Hx    Colon cancer Neg Hx    Anesthesia problems Neg Hx    Hypotension Neg Hx    Malignant hyperthermia Neg Hx    Pseudochol deficiency Neg Hx    Colon polyps Neg Hx    Esophageal cancer Neg Hx    Rectal cancer Neg Hx    Stomach cancer Neg Hx     Social History   Tobacco Use   Smoking status: Never   Smokeless tobacco: Never  Vaping Use   Vaping Use: Never used  Substance Use Topics   Alcohol use: No    Alcohol/week: 0.0 standard drinks   Drug use: Never    Home Medications Prior to Admission medications   Medication Sig Start Date End Date Taking? Authorizing Provider  acetaminophen (TYLENOL) 500 MG tablet Take 1,000 mg by mouth every 6 (six) hours as needed for moderate pain.   Yes [provider]  amLODipine (NORVASC) 10 MG tablet TAKE 1 TABLET EVERY DAY 02/01/21  Yes Mosie Lukes, MD  Ascorbic Acid (VITAMIN C) 500 MG CHEW Chew 500 mg by mouth daily.   Yes [provider]  aspirin EC 81 MG tablet Take 1 tablet (81 mg total) by mouth daily. Patient taking differently: Take 81 mg by mouth in the morning. 05/04/15  Yes [name], MD  Blood Glucose Monitoring Suppl (TRUE METRIX AIR GLUCOSE METER) w/Device KIT USE TO CHECK BLOOD SUGAR ONCE DAILY AND AS NEEDED.  DX CODE E11.9 07/27/20  Yes Mosie Lukes, MD  cefdinir (OMNICEF) 300 MG capsule Take 1 capsule (300 mg total) by mouth 2 (two) times daily for 4 days. 02/01/21 02/05/21 Yes [place] Donnamarie Poag, DO  Cholecalciferol (VITAMIN D) 50 MCG (2000 UT) CAPS Take 2,000 Units by mouth daily.    Yes [provider]  diclofenac Sodium (VOLTAREN) 1 % GEL Apply 1 application topically 4 (four) times daily as needed (hip pain).   Yes [provider]  famotidine (PEPCID) 20 MG tablet Take 20 mg by mouth at bedtime as needed for heartburn or indigestion.   Yes [provider]  furosemide (LASIX) 20 MG tablet Take 1 tablet (20 mg total) by mouth daily as needed for edema (weight gain greater than 3 pounds in 1 day or 5 pounds in 1 week). 02/01/21 05/02/21 Yes [place] Eric J, DO  glucose blood (TRUE METRIX BLOOD GLUCOSE TEST) test strip USE TO CHECK BLOOD SUGAR ONCE A DAY OR AS NEEDED.  DX CODE: E11.9 07/27/20  Yes Mosie Lukes, MD  isosorbide mononitrate (IMDUR) 60 MG 24 hr tablet Take 1.5 tablets (90 mg total) by mouth daily. Patient taking differently: Take 90 mg by mouth every evening. 12/14/20 12/14/21 Yes Weaver, Scott T, PA-C  ketorolac (TORADOL) 10 MG tablet Take 1 tablet (10 mg total) by mouth every 6 (six) hours as needed. Patient taking differently: Take 10 mg by mouth every 6 (six) hours as needed (pain). 10/30/20  Yes Jaquita Folds, MD  magnesium oxide (MAG-OX) 400 MG tablet Take 400 mg by mouth at bedtime.   Yes [provider]  metoprolol succinate (TOPROL-XL) 50 MG 24 hr tablet Take 1.5 tablets (75 mg  total) by mouth at bedtime. Take with or immediately following a meal. 12/14/20 12/14/21 Yes Weaver, Scott T, PA-C  nitroGLYCERIN (NITROSTAT) 0.4 MG SL tablet Place 1 tablet (0.4 mg total) under the tongue every 5 (five) minutes as needed for chest pain. Patient taking differently: Place 0.4 mg under the tongue every 5 (five) minutes x 3 doses as needed for chest pain. 08/29/20 08/29/21 Yes Weaver, Scott T, PA-C  omeprazole (PRILOSEC) 20 MG capsule TAKE 1 CAPSULE EVERY DAY AS NEEDED Patient taking differently: Take 20 mg by mouth in the morning. 01/02/21  Yes Mosie Lukes, MD  ondansetron (ZOFRAN ODT) 4 MG disintegrating tablet Take 1 tablet (4 mg total) by mouth every 6 (six) hours as needed for nausea or vomiting. 12/15/19  Yes Armbruster, Carlota Raspberry, MD  potassium chloride SA (KLOR-CON) 20 MEQ tablet Take  0.5 tablets (10 mEq total) by mouth daily. Patient taking differently: Take 10 mEq by mouth every evening. 09/14/20  Yes Weaver, Scott T, PA-C  pramipexole (MIRAPEX) 0.25 MG tablet Take 1 tablet (0.25 mg total) by mouth 2 (two) times daily. 09/26/20  Yes Mosie Lukes, MD  simvastatin (ZOCOR) 20 MG tablet TAKE 1 TABLET AT BEDTIME Patient taking differently: Take 20 mg by mouth every evening. 09/01/20  Yes Weaver, Scott T, PA-C  sulfamethoxazole-trimethoprim (BACTRIM DS) 800-160 MG tablet Take 1 tablet by mouth 2 (two) times daily. 02/03/21  Yes [provider]  traMADol (ULTRAM) 50 MG tablet Take 1 tablet (50 mg total) by mouth every 6 (six) hours as needed for severe pain. 01/26/21  Yes Michael Boston, MD  TRUEplus Lancets 33G MISC USE TO CHECK BLOOD SUGAR ONCE DAILY AND AS NEEDED.  DX CODE: E11.9 07/27/20  Yes Mosie Lukes, MD  Zinc 50 MG TABS Take 50 mg by mouth daily.   Yes [provider]  HYDROcodone-acetaminophen (NORCO/VICODIN) 5-325 MG tablet Take 1 tablet by mouth every 6 (six) hours as needed for moderate pain. 02/03/21 02/08/21  [provider]  mirabegron ER (MYRBETRIQ) 25 MG TB24 tablet Take 1 tablet (25 mg total) by mouth daily. 01/23/21   Jaquita Folds, MD    Allergies    Baclofen and Nitrofurantoin  Review of Systems   Review of Systems  Constitutional:  Positive for appetite change, chills and fever.  Respiratory:  Negative for cough and shortness of breath.   Cardiovascular:  Negative for chest pain.  Gastrointestinal:  Positive for abdominal pain. Negative for nausea and vomiting.  Genitourinary:  Negative for dysuria and hematuria.  Skin:  Positive for color change and wound.  Neurological:  Positive for weakness (generalized). Negative for headaches.  All other systems reviewed and are negative.  Physical Exam Updated Vital Signs BP (!) 120/43 (BP Location: Left Arm)   Pulse 62   Temp 99.1 F (37.3 C) (Oral)   Resp 16   Ht _0   (1.575 m)   Wt 68.9 kg   LMP  (LMP Unknown)   SpO2 93%   BMI 27.80 kg/m   Physical Exam Vitals and nursing note reviewed.  Constitutional:      Appearance: Normal appearance. She is well-developed.  HENT:     Head: Normocephalic and atraumatic.  Eyes:     General: Lids are normal.     Conjunctiva/sclera: Conjunctivae normal.     Pupils: Pupils are equal, round, and reactive to light.  Cardiovascular:     Rate and Rhythm: Normal rate and regular rhythm.     Pulses:  Normal pulses.     Heart sounds: Normal heart sounds. No murmur heard.   No friction rub. No gallop.  Pulmonary:     Effort: Pulmonary effort is normal.     Breath sounds: Normal breath sounds.     Comments: Lungs clear to auscultation bilaterally.  Symmetric chest rise.  No wheezing, rales, rhonchi. Abdominal:     Palpations: Abdomen is soft. Abdomen is not rigid.     Tenderness: There is abdominal tenderness in the periumbilical area. There is no guarding.     Comments: Midline surgical incision site that is erythematous, warm to touch with drainage of purulent drainage.  Diffuse tender noted.  Abdomen appears slightly bloated.  No rigidity, guarding.  Musculoskeletal:        General: Normal range of motion.     Cervical back: Full passive range of motion without pain.  Skin:    General: Skin is warm and dry.     Capillary Refill: Capillary refill takes less than 2 seconds.  Neurological:     Mental Status: She is alert and oriented to person, place, and time.  Psychiatric:        Speech: Speech normal.    ED Results / Procedures / Treatments   Labs (all labs ordered are listed, but only abnormal results are displayed) Labs Reviewed  COMPREHENSIVE METABOLIC PANEL - Abnormal; Notable for the following components:      Result Value   Sodium 131 (*)    Glucose, Bld 118 (*)    Creatinine, Ser 1.44 (*)    Calcium 8.7 (*)    Albumin 3.1 (*)    Alkaline Phosphatase 164 (*)    GFR, Estimated 36 (*)    All  other components within normal limits  CBC WITH DIFFERENTIAL/PLATELET - Abnormal; Notable for the following components:   WBC 12.4 (*)    Hemoglobin 11.9 (*)    Neutro Abs 11.9 (*)    Lymphs Abs 0.3 (*)    Abs Immature Granulocytes 0.11 (*)    All other components within normal limits  URINALYSIS, ROUTINE W REFLEX MICROSCOPIC - Abnormal; Notable for the following components:   APPearance CLOUDY (*)    Specific Gravity, Urine <1.005 (*)    Ketones, ur 5 (*)    Leukocytes,Ua SMALL (*)    Bacteria, UA RARE (*)    Non Squamous Epithelial 0-5 (*)    All other components within normal limits  GLUCOSE, CAPILLARY - Abnormal; Notable for the following components:   Glucose-Capillary 133 (*)    All other components within normal limits  RESP PANEL BY RT-PCR (FLU A&B, COVID) ARPGX2  CULTURE, BLOOD (ROUTINE X 2)  CULTURE, BLOOD (ROUTINE X 2)  URINE CULTURE  LACTIC ACID, PLASMA  LACTIC ACID, PLASMA  COMPREHENSIVE METABOLIC PANEL  CBC    EKG None  Radiology CT HEAD WO CONTRAST  Result Date: 02/05/2021 CLINICAL DATA:  Transient ischemic attack (TIA). Additional history provided: Patient reports "floaters" in eyes. EXAM: CT HEAD WITHOUT CONTRAST TECHNIQUE: Contiguous axial images were obtained from the base of the skull through the vertex without intravenous contrast. COMPARISON:  Brain MRI 08/13/2017.  Head CT 04/01/2013. FINDINGS: Brain: Mild generalized parenchymal atrophy. Mild-to-moderate patchy and ill-defined hypoattenuation within the cerebral white matter, nonspecific but compatible with chronic small vessel ischemic disease. Bilateral basal ganglia mineralization. There is no acute intracranial hemorrhage. No demarcated cortical infarct. No extra-axial fluid collection. No evidence of an intracranial mass. No midline shift. Vascular: No hyperdense vessel.  Atherosclerotic  calcifications Skull: Normal. Negative for fracture or focal lesion. Sinuses/Orbits: Visualized orbits show no acute  finding. No significant paranasal sinus disease at the imaged levels. IMPRESSION: No evidence of acute intracranial abnormality. Mild-to-moderate chronic small vessel ischemic changes within the cerebral white matter, progressed from the head CT of 04/01/2013. Mild generalized parenchymal atrophy. Electronically Signed   By: Kellie Simmering DO   On: 02/05/2021 13:18   CT ABDOMEN PELVIS W CONTRAST  Result Date: 02/04/2021 CLINICAL DATA:  Abdominal pain. Gallbladder inter knee a surgery 720, recent hospital discharge. Drainage from incision and fever. EXAM: CT ABDOMEN AND PELVIS WITH CONTRAST TECHNIQUE: Multidetector CT imaging of the abdomen and pelvis was performed using the standard protocol following bolus administration of intravenous contrast. CONTRAST:  70m OMNIPAQUE IOHEXOL 350 MG/ML SOLN COMPARISON:  Postoperative CT 01/28/2021 FINDINGS: Lower chest: Diminished pleural effusions. Improving basilar atelectasis, more so on the right. Stable moderate-sized hiatal hernia. Coronary artery calcifications. Hepatobiliary: Again seen low-density adjacent to the falciform ligament. No intrahepatic fluid collection. Post recent cholecystectomy. Increased size of fluid collection in the gallbladder fossa currently measuring 4 cm, previously 3.3 cm. Collection contains heterogeneous fluid and air, with small amount of fluid and air tracking superficial to the anterior liver. Minimal periportal edema without biliary dilatation. No common bile duct dilatation. Pancreas: No ductal dilatation or inflammation. Spleen: Normal in size without focal abnormality. Adrenals/Urinary Tract: Normal adrenal glands. No hydronephrosis or renal calculi. Stable cysts in the right kidney. Lobulated renal contours again seen. Urinary bladder is partially distended. Focus of air in the urinary bladder likely related to prior Foley catheter. Equivocal bladder wall thickening. Stomach/Bowel: Moderate hiatal hernia, similar to prior.  Decompressed stomach. There is no small bowel obstruction or inflammation. High-density barium within the colon from prior esophagram. Normal appendix. Left colonic diverticulosis without diverticulitis. No colonic inflammation. Vascular/Lymphatic: Moderate aortic atherosclerosis. Patent portal vein. No acute vascular findings. No enlarged lymph nodes in the abdomen or pelvis Reproductive: Status post hysterectomy. No adnexal masses. Other: Trace air in the cholecystectomy bed that tracks anterior in peripheral to the liver. Trace perihepatic fluid. Gallbladder fossa fluid collection as described above. No other focal fluid collection. No significant ascites/free fluid. There is postsurgical change of the anterior abdominal wall. There is irregularity at the skin surface with small foci of gas in the subcutaneous tissues but no drainable collection. Expected stranding of the subcutaneous tissues. Minimal stranding in the subjacent omental fat. Similar appearance of fat containing hernia just above postsurgical change, hernia neck spanning 4.4 cm transverse, series 2, image 32. Minimal air in the anterior abdominal wall which is improving from prior exam. Musculoskeletal: Stable degenerative change in the spine. No acute findings. IMPRESSION: 1. Post recent cholecystectomy. Increased size of fluid collection in the gallbladder fossa currently measuring 4 cm, previously 3.3 cm. Collection contains heterogeneous fluid and air, with small amount of fluid and air tracking superficial to the anterior liver. Differential considerations include in postoperative seroma, +/- infection, or biloma. 2. Postsurgical change of the anterior abdominal wall with irregular skin surface and small foci of gas in the subcutaneous tissues but no drainable collection. 3. Similar appearance of fat containing hernia just above postsurgical change. 4. Equivocal bladder wall thickening. 5. Colonic diverticulosis without diverticulitis. 6.  Diminished pleural effusions and basilar atelectasis. 7. Moderate hiatal hernia. Aortic Atherosclerosis (ICD10-I70.0). Electronically Signed   By: MKeith RakeM.D.   On: 02/04/2021 23:56   DG Chest Portable 1 View  Result Date: 02/04/2021 CLINICAL DATA:  Shortness of breath. EXAM: PORTABLE CHEST 1 VIEW COMPARISON:  Radiograph and chest CT 01/28/2021. FINDINGS: Lower lung volumes from prior exam. Patient is rotated. Grossly stable heart size and mediastinal contours. Increasing right basilar opacity. Small pleural effusions. No pulmonary edema. No pneumothorax. IMPRESSION: 1. Lower lung volumes from prior exam. Increasing right basilar opacity, favor atelectasis over pneumonia. 2. Small pleural effusions. Electronically Signed   By: Keith Rake M.D.   On: 02/04/2021 21:44    Procedures Procedures   Medications Ordered in ED Medications  lidocaine-EPINEPHrine (XYLOCAINE W/EPI) 2 %-1:200000 (PF) injection (has no administration in time range)  ceFEPIme (MAXIPIME) 2 g in sodium chloride 0.9 % 100 mL IVPB (2 g Intravenous New Bag/Given 02/05/21 0246)  enoxaparin (LOVENOX) injection 30 mg (30 mg Subcutaneous Given 02/05/21 1057)  morphine 2 MG/ML injection 0.5 mg (has no administration in time range)  ondansetron (ZOFRAN) injection 4 mg (has no administration in time range)  aspirin EC tablet 81 mg (81 mg Oral Given 02/05/21 0849)  isosorbide mononitrate (IMDUR) 24 hr tablet 90 mg (90 mg Oral Given 02/05/21 1636)  simvastatin (ZOCOR) tablet 20 mg (20 mg Oral Given 02/05/21 1639)  famotidine (PEPCID) tablet 20 mg (has no administration in time range)  pantoprazole (PROTONIX) EC tablet 40 mg (40 mg Oral Given 02/05/21 1055)  pramipexole (MIRAPEX) tablet 0.25 mg (0.25 mg Oral Given 02/05/21 1056)  potassium chloride (KLOR-CON) CR tablet 10 mEq (10 mEq Oral Given 02/05/21 1638)  cholecalciferol (VITAMIN D3) tablet 2,000 Units (2,000 Units Oral Given 02/05/21 1055)  ascorbic acid (VITAMIN C) tablet 500  mg (500 mg Oral Given 02/05/21 1056)  zinc sulfate capsule 220 mg (220 mg Oral Given 02/05/21 1056)  magnesium oxide (MAG-OX) tablet 400 mg (400 mg Oral Given 02/05/21 1056)  vancomycin (VANCOREADY) IVPB 1250 mg/250 mL (has no administration in time range)  acetaminophen (TYLENOL) tablet 650 mg (650 mg Oral Given 02/05/21 1436)  hydrOXYzine (ATARAX/VISTARIL) tablet 10 mg (has no administration in time range)  cyclobenzaprine (FLEXERIL) tablet 5 mg (5 mg Oral Given 02/05/21 1707)  ondansetron (ZOFRAN) injection 4 mg (4 mg Intravenous Given 02/04/21 2204)  fentaNYL (SUBLIMAZE) injection 50 mcg (50 mcg Intravenous Given 02/04/21 2204)  iohexol (OMNIPAQUE) 350 MG/ML injection 80 mL (80 mLs Intravenous Contrast Given 02/04/21 2329)  acetaminophen (TYLENOL) tablet 1,000 mg (1,000 mg Oral Given 02/05/21 0007)  vancomycin (VANCOCIN) IVPB 1000 mg/200 mL premix (1,000 mg Intravenous New Bag/Given 02/05/21 0435)  sodium chloride 0.9 % bolus 500 mL (500 mLs Intravenous New Bag/Given 02/05/21 0330)    ED Course  I have reviewed the triage vital signs and the nursing notes.  Pertinent labs & imaging results that were available during my care of the patient were reviewed by me and considered in my medical decision making (see chart for details).    MDM Rules/Calculators/A&P                           86 year old female who presents for evaluation of abdominal pain, wound erythema, drainage, fevers, chills.  History of cholecystectomy on 01/25/2021.  Seen outpatient clinic yesterday and started on Bactrim which she has been taking.  Reports fever, worsening pain, drainage today.  On initial arrival, she has a low-grade temperature 99.8.  Vitals are otherwise stable.  On exam, she has a surgical incision site noted to the midline abdomen that is warm, erythematous with active drainage.  Concern for postop infection.  Plan check labs.  Rectal  temp is 103.1. Will give tylenol.   Dr. Zenia Resides (Surgery) at bedside.   Abdominal incision site had a small seroma which she drained at bedside.  She recommends looking for other source of infection.  Recommends holding off on CT abdomen at this time, unless patient with increasing white count.   Lactic is negative.  CBC shows leukocytosis of 12.4.  3 days ago, she had a white blood cell count of 7.5.  Given increasing leukocytosis will plan for imaging. CMP shows BUN of 19, creatinine 1.44.  COVID is negative.  Chest x-ray shows increasing right basilar opacity.  Question if this is infectious versus atelectasis.  Patient has had some respiratory distress since her admission to the hospital.  She did have some difficulty breathing, cough today that daughter felt like maybe was from anxiety.   At this time, her lactic is normal.  She is not hypotensive.  Will hold full 30 cc/kg given her history of fluid overload previously.  CT scan shows recent cholecystectomy.  There is increased size of fluid collection in gallbladder fossa containing heterogenous fluid and air with small mount of fluid and air tracking superficially anterior liver.  She was being treated for UTI during her admission.  Urine culture just showed is sensitive to cefepime.  Discussed patient with Dr. Zenia Resides (General Surgery).  She recommends patient get admitted to medicine given complex medical history.  We will follow along as needed.  Agrees with plan.  Discussed with Dr. Sherrian Divers hospitalist who accepts patient for admission.  Portions of this note were generated with [employer]. Dictation errors may occur despite best attempts at proofreading.   Final Clinical Impression(s) / ED Diagnoses Final diagnoses:  Fever, unspecified fever cause    Rx / DC Orders ED Discharge Orders     None        Desma Mcgregor 02/05/21 2128    Lucrezia Starch, MD 02/07/21 0000

## 2021-02-04 NOTE — ED Triage Notes (Signed)
Hx of gallbladder/hernia sx on 7/20. Recently discharged from hospital. Family states pts incision has new drainage and pt has a fever. Surgery told family to bring pt to ER. Incision is red, warm and draining upon assessment.

## 2021-02-04 NOTE — Procedures (Signed)
The skin at the mid-portion of the midline incision was infiltrated with 5 mL 2% lidocaine with epi. The central aspect of the incision was incised with an 11-blade scalpel. There was return of a moderate amount of turbid serous fluid but no frank pus. The wound cavity tracked superiorly but on probing seemed superficial to the fascia. A gauze wick was placed followed by a dry gauze dressing. The patient tolerated the procedure with no apparent complications.

## 2021-02-05 ENCOUNTER — Observation Stay (HOSPITAL_COMMUNITY): Payer: Medicare HMO

## 2021-02-05 ENCOUNTER — Other Ambulatory Visit: Payer: Self-pay

## 2021-02-05 DIAGNOSIS — K8010 Calculus of gallbladder with chronic cholecystitis without obstruction: Secondary | ICD-10-CM

## 2021-02-05 DIAGNOSIS — R188 Other ascites: Secondary | ICD-10-CM | POA: Diagnosis not present

## 2021-02-05 DIAGNOSIS — E119 Type 2 diabetes mellitus without complications: Secondary | ICD-10-CM | POA: Diagnosis present

## 2021-02-05 DIAGNOSIS — N179 Acute kidney failure, unspecified: Secondary | ICD-10-CM | POA: Diagnosis present

## 2021-02-05 DIAGNOSIS — T8141XA Infection following a procedure, superficial incisional surgical site, initial encounter: Secondary | ICD-10-CM | POA: Diagnosis present

## 2021-02-05 DIAGNOSIS — Z20822 Contact with and (suspected) exposure to covid-19: Secondary | ICD-10-CM | POA: Diagnosis present

## 2021-02-05 DIAGNOSIS — I2510 Atherosclerotic heart disease of native coronary artery without angina pectoris: Secondary | ICD-10-CM

## 2021-02-05 DIAGNOSIS — K219 Gastro-esophageal reflux disease without esophagitis: Secondary | ICD-10-CM | POA: Diagnosis present

## 2021-02-05 DIAGNOSIS — Z22321 Carrier or suspected carrier of Methicillin susceptible Staphylococcus aureus: Secondary | ICD-10-CM | POA: Diagnosis not present

## 2021-02-05 DIAGNOSIS — L03311 Cellulitis of abdominal wall: Secondary | ICD-10-CM | POA: Diagnosis present

## 2021-02-05 DIAGNOSIS — E860 Dehydration: Secondary | ICD-10-CM | POA: Diagnosis present

## 2021-02-05 DIAGNOSIS — B961 Klebsiella pneumoniae [K. pneumoniae] as the cause of diseases classified elsewhere: Secondary | ICD-10-CM | POA: Diagnosis present

## 2021-02-05 DIAGNOSIS — I10 Essential (primary) hypertension: Secondary | ICD-10-CM | POA: Diagnosis not present

## 2021-02-05 DIAGNOSIS — G250 Essential tremor: Secondary | ICD-10-CM | POA: Diagnosis present

## 2021-02-05 DIAGNOSIS — H43812 Vitreous degeneration, left eye: Secondary | ICD-10-CM | POA: Diagnosis present

## 2021-02-05 DIAGNOSIS — G2581 Restless legs syndrome: Secondary | ICD-10-CM | POA: Diagnosis not present

## 2021-02-05 DIAGNOSIS — A419 Sepsis, unspecified organism: Secondary | ICD-10-CM | POA: Diagnosis not present

## 2021-02-05 DIAGNOSIS — L0390 Cellulitis, unspecified: Secondary | ICD-10-CM | POA: Diagnosis present

## 2021-02-05 DIAGNOSIS — T8143XA Infection following a procedure, organ and space surgical site, initial encounter: Secondary | ICD-10-CM | POA: Diagnosis not present

## 2021-02-05 DIAGNOSIS — I5032 Chronic diastolic (congestive) heart failure: Secondary | ICD-10-CM | POA: Diagnosis not present

## 2021-02-05 DIAGNOSIS — G319 Degenerative disease of nervous system, unspecified: Secondary | ICD-10-CM | POA: Diagnosis not present

## 2021-02-05 DIAGNOSIS — I110 Hypertensive heart disease with heart failure: Secondary | ICD-10-CM | POA: Diagnosis present

## 2021-02-05 DIAGNOSIS — M810 Age-related osteoporosis without current pathological fracture: Secondary | ICD-10-CM | POA: Diagnosis present

## 2021-02-05 DIAGNOSIS — I69398 Other sequelae of cerebral infarction: Secondary | ICD-10-CM | POA: Diagnosis not present

## 2021-02-05 DIAGNOSIS — Z823 Family history of stroke: Secondary | ICD-10-CM | POA: Diagnosis not present

## 2021-02-05 DIAGNOSIS — Y838 Other surgical procedures as the cause of abnormal reaction of the patient, or of later complication, without mention of misadventure at the time of the procedure: Secondary | ICD-10-CM | POA: Diagnosis present

## 2021-02-05 DIAGNOSIS — L0291 Cutaneous abscess, unspecified: Secondary | ICD-10-CM | POA: Diagnosis present

## 2021-02-05 DIAGNOSIS — R509 Fever, unspecified: Secondary | ICD-10-CM | POA: Diagnosis present

## 2021-02-05 DIAGNOSIS — H43399 Other vitreous opacities, unspecified eye: Secondary | ICD-10-CM | POA: Diagnosis not present

## 2021-02-05 DIAGNOSIS — N390 Urinary tract infection, site not specified: Secondary | ICD-10-CM | POA: Diagnosis present

## 2021-02-05 DIAGNOSIS — I5033 Acute on chronic diastolic (congestive) heart failure: Secondary | ICD-10-CM | POA: Diagnosis not present

## 2021-02-05 DIAGNOSIS — G459 Transient cerebral ischemic attack, unspecified: Secondary | ICD-10-CM | POA: Diagnosis not present

## 2021-02-05 DIAGNOSIS — E782 Mixed hyperlipidemia: Secondary | ICD-10-CM | POA: Diagnosis present

## 2021-02-05 DIAGNOSIS — N3289 Other specified disorders of bladder: Secondary | ICD-10-CM | POA: Diagnosis present

## 2021-02-05 DIAGNOSIS — E871 Hypo-osmolality and hyponatremia: Secondary | ICD-10-CM | POA: Diagnosis present

## 2021-02-05 DIAGNOSIS — I5030 Unspecified diastolic (congestive) heart failure: Secondary | ICD-10-CM

## 2021-02-05 LAB — URINALYSIS, ROUTINE W REFLEX MICROSCOPIC
Bilirubin Urine: NEGATIVE
Glucose, UA: NEGATIVE mg/dL
Hgb urine dipstick: NEGATIVE
Ketones, ur: 5 mg/dL — AB
Nitrite: NEGATIVE
Protein, ur: NEGATIVE mg/dL
Specific Gravity, Urine: <1.005 — ABNORMAL LOW (ref 1.005–1.030)
pH: 5 (ref 5.0–8.0)

## 2021-02-05 LAB — LACTIC ACID, PLASMA: Lactic Acid, Venous: 1.5 mmol/L (ref 0.5–1.9)

## 2021-02-05 LAB — GLUCOSE, CAPILLARY: Glucose-Capillary: 133 mg/dL — ABNORMAL HIGH (ref 70–99)

## 2021-02-05 MED ORDER — PANTOPRAZOLE SODIUM 40 MG PO TBEC
40.0000 mg | DELAYED_RELEASE_TABLET | Freq: Every day | ORAL | Status: DC
  Administered 2021-02-05: 40 mg via ORAL
  Filled 2021-02-05: qty 1

## 2021-02-05 MED ORDER — ACETAMINOPHEN 325 MG PO TABS
650.0000 mg | ORAL_TABLET | Freq: Four times a day (QID) | ORAL | Status: DC | PRN
  Administered 2021-02-05 – 2021-02-08 (×8): 650 mg via ORAL
  Filled 2021-02-05 (×8): qty 2

## 2021-02-05 MED ORDER — VANCOMYCIN HCL 1250 MG/250ML IV SOLN
1250.0000 mg | INTRAVENOUS | Status: DC
  Administered 2021-02-06 – 2021-02-07 (×2): 1250 mg via INTRAVENOUS
  Filled 2021-02-05 (×2): qty 250

## 2021-02-05 MED ORDER — ONDANSETRON HCL 4 MG/2ML IJ SOLN: 4.0000 mg | Freq: Four times a day (QID) | INTRAMUSCULAR | Status: DC | PRN

## 2021-02-05 MED ORDER — CYCLOBENZAPRINE HCL 5 MG PO TABS
5.0000 mg | ORAL_TABLET | Freq: Three times a day (TID) | ORAL | Status: DC | PRN
  Administered 2021-02-05 – 2021-02-07 (×4): 5 mg via ORAL
  Filled 2021-02-05 (×4): qty 1

## 2021-02-05 MED ORDER — MORPHINE SULFATE (PF) 2 MG/ML IV SOLN
0.5000 mg | INTRAVENOUS | Status: DC | PRN
  Administered 2021-02-06 – 2021-02-07 (×2): 0.5 mg via INTRAVENOUS
  Filled 2021-02-05 (×2): qty 1

## 2021-02-05 MED ORDER — MAGNESIUM OXIDE 400 MG PO TABS
400.0000 mg | ORAL_TABLET | Freq: Every day | ORAL | Status: DC
  Filled 2021-02-05: qty 1

## 2021-02-05 MED ORDER — VANCOMYCIN HCL IN DEXTROSE 1-5 GM/200ML-% IV SOLN
1000.0000 mg | Freq: Once | INTRAVENOUS | Status: AC
  Administered 2021-02-05: 1000 mg via INTRAVENOUS
  Filled 2021-02-05: qty 200

## 2021-02-05 MED ORDER — SIMVASTATIN 20 MG PO TABS
20.0000 mg | ORAL_TABLET | Freq: Every evening | ORAL | Status: DC
  Administered 2021-02-05 – 2021-02-09 (×5): 20 mg via ORAL
  Filled 2021-02-05 (×5): qty 1

## 2021-02-05 MED ORDER — ASCORBIC ACID 500 MG PO TABS
500.0000 mg | ORAL_TABLET | Freq: Every day | ORAL | Status: DC
  Administered 2021-02-05 – 2021-02-10 (×5): 500 mg via ORAL
  Filled 2021-02-05 (×5): qty 1

## 2021-02-05 MED ORDER — AMLODIPINE BESYLATE 5 MG PO TABS: 10.0000 mg | ORAL_TABLET | Freq: Every day | ORAL | Status: DC

## 2021-02-05 MED ORDER — POTASSIUM CHLORIDE CRYS ER 10 MEQ PO TBCR
10.0000 meq | EXTENDED_RELEASE_TABLET | Freq: Every evening | ORAL | Status: DC
  Administered 2021-02-05 – 2021-02-09 (×5): 10 meq via ORAL
  Filled 2021-02-05 (×5): qty 1

## 2021-02-05 MED ORDER — SODIUM CHLORIDE 0.9 % IV SOLN
2.0000 g | INTRAVENOUS | Status: DC
  Administered 2021-02-05 – 2021-02-06 (×3): 2 g via INTRAVENOUS
  Filled 2021-02-05 (×3): qty 2

## 2021-02-05 MED ORDER — ISOSORBIDE MONONITRATE ER 60 MG PO TB24
90.0000 mg | ORAL_TABLET | Freq: Every evening | ORAL | Status: DC
  Administered 2021-02-05 – 2021-02-09 (×5): 90 mg via ORAL
  Filled 2021-02-05 (×5): qty 1

## 2021-02-05 MED ORDER — METOPROLOL SUCCINATE ER 50 MG PO TB24: 75.0000 mg | ORAL_TABLET | Freq: Every day | ORAL | Status: DC

## 2021-02-05 MED ORDER — ZINC SULFATE 220 (50 ZN) MG PO CAPS
220.0000 mg | ORAL_CAPSULE | Freq: Every day | ORAL | Status: DC
  Administered 2021-02-05 – 2021-02-10 (×5): 220 mg via ORAL
  Filled 2021-02-05 (×6): qty 1

## 2021-02-05 MED ORDER — PRAMIPEXOLE DIHYDROCHLORIDE 0.25 MG PO TABS
0.2500 mg | ORAL_TABLET | Freq: Two times a day (BID) | ORAL | Status: DC
  Administered 2021-02-05 – 2021-02-10 (×10): 0.25 mg via ORAL
  Filled 2021-02-05 (×10): qty 1

## 2021-02-05 MED ORDER — ENOXAPARIN SODIUM 30 MG/0.3ML IJ SOSY
30.0000 mg | PREFILLED_SYRINGE | INTRAMUSCULAR | Status: DC
  Administered 2021-02-05: 30 mg via SUBCUTANEOUS
  Filled 2021-02-05: qty 0.3

## 2021-02-05 MED ORDER — MAGNESIUM OXIDE -MG SUPPLEMENT 400 (240 MG) MG PO TABS
400.0000 mg | ORAL_TABLET | Freq: Every day | ORAL | Status: DC
  Administered 2021-02-05 – 2021-02-10 (×5): 400 mg via ORAL
  Filled 2021-02-05 (×5): qty 1

## 2021-02-05 MED ORDER — FAMOTIDINE 20 MG PO TABS
20.0000 mg | ORAL_TABLET | Freq: Every evening | ORAL | Status: DC | PRN
  Administered 2021-02-06: 20 mg via ORAL
  Filled 2021-02-05: qty 1

## 2021-02-05 MED ORDER — SODIUM CHLORIDE 0.9 % IV BOLUS
500.0000 mL | Freq: Once | INTRAVENOUS | Status: AC
  Administered 2021-02-05: 500 mL via INTRAVENOUS

## 2021-02-05 MED ORDER — HYDROXYZINE HCL 10 MG PO TABS
10.0000 mg | ORAL_TABLET | Freq: Three times a day (TID) | ORAL | Status: DC | PRN
  Administered 2021-02-06 (×2): 10 mg via ORAL
  Filled 2021-02-05 (×4): qty 1

## 2021-02-05 MED ORDER — VITAMIN D 25 MCG (1000 UNIT) PO TABS
2000.0000 [IU] | ORAL_TABLET | Freq: Every day | ORAL | Status: DC
  Administered 2021-02-05 – 2021-02-10 (×5): 2000 [IU] via ORAL
  Filled 2021-02-05 (×5): qty 2

## 2021-02-05 MED ORDER — ASPIRIN EC 81 MG PO TBEC
81.0000 mg | DELAYED_RELEASE_TABLET | Freq: Every morning | ORAL | Status: DC
  Administered 2021-02-05 – 2021-02-10 (×6): 81 mg via ORAL
  Filled 2021-02-05 (×6): qty 1

## 2021-02-05 NOTE — Progress Notes (Signed)
Pt shivering and shaking with c/o cramp in lt leg. Daughters at bedside. Temp 98.7. Denies pain other than the leg cramp. Dr. Wyline Copas aware via text.

## 2021-02-05 NOTE — Progress Notes (Signed)
rm [address] 86yoF, Lap Chole, on 12/30/20. Here w/ Sepsis. BP is low at 103/38; pulse 57 After 500 cc NS Bolus. Please call. Pt is alert and oriented, RR is in to assess Heather [name] as per order.

## 2021-02-05 NOTE — Progress Notes (Signed)
Pharmacy Antibiotic Note  Heather [name] is a 86 y.o. female admitted on 02/04/2021 who recently underwent laparoscopic cholecystectomy with primary repair of an incisional hernia on 7/20.  Yesterday she was seen in urgent clinic with a wound infection. She was noted to have cellulitis at her umbilical incision and was started on Bactrim.  Pt presents to ED with worsening redness and pain at her incision. Pharmacy has been consulted to dose vancomycin and cefepime for wound infection.  Plan: Vancomycin 1gm IV x 1 then '1250mg'$  q48h (AUC 536.3, Scr 1.44) Cefepime 2gm IV q24h Follow renal function and clinical course  Height: '5\' 2"'$  (157.5 cm) Weight: 68.9 kg (152 lb) IBW/kg (Calculated) : 50.1  Temp (24hrs), Avg:101.5 F (38.6 C), Min:99.8 F (37.7 C), Max:103.1 F (39.5 C)  Recent Labs  Lab 01/29/21 0125 01/30/21 0417 01/31/21 0457 02/01/21 0450 02/02/21 0501 02/04/21 2130  WBC 8.5 6.2 8.4 7.5  --  12.4*  CREATININE 1.08* 1.10* 2.07* 1.39* 0.91 1.44*  LATICACIDVEN  --   --   --   --   --  1.7    Estimated Creatinine Clearance: 26.4 mL/min (A) (by C-G formula based on SCr of 1.44 mg/dL (H)).    Allergies  Allergen Reactions   Baclofen Other (See Comments)    Hyperactivity    Nitrofurantoin Rash    Antimicrobials this admission: 7/31 vanc >> 7/31 cefepime >>  Dose adjustments this admission:   Microbiology results: 7/31 BCx:  7/31 UCx:  Thank you for allowing pharmacy to be a part of this patient's care.  Dolly Rias RPh 02/05/2021, 12:24 AM

## 2021-02-05 NOTE — Progress Notes (Signed)
Dr Wyline Copas in to see pt. Pt up in recliner and more calm with daughters at bedside. No distress. VSS.  See new orders.

## 2021-02-05 NOTE — Progress Notes (Signed)
86 y.o female admitted with sepsis secondary to cellulitis s/p postop lapchole. Patient remains hypotensive and bradycardic. She is currently afebrile with blood pressure 95/32 mm Hg and pulse rate 50 beats/min.   - Will hold BP meds Metoprolol and Amlodipine in the setting of sepsis. Resume as BP tolerates - f/u cultures, trend lactic/ PCT - Daily CBC - monitor WBC/ fever curve - IV antibiotics: cefepime & vancomycin  - Gentle IVF hydration as needed    Rufina Falco, DNP, CCRN, FNP-C, AGACNP-BC Acute Care Nurse Practitioner  [facility] Pager: [phone] [place] [facility]

## 2021-02-05 NOTE — H&P (Signed)
History and Physical    Heather [name] YKD:[id number] DOB: [birth year] DOA: 02/04/2021  PCP: Mosie Lukes, MD  Patient coming from: Home  I have personally briefly reviewed patient's old medical records in [place]  Chief Complaint: fever  HPI: Heather [name] is a 86 y.o. female with medical history significant for [place], CAD, history of DVT, type 2 diabetes, GERD, hyperlipidemia and recent laparoscopic cholecystectomy who presents with concerns of increasing abdominal pain, incisional drainage and fever.  Patient recently had laparoscopic cholecystectomy with intraoperative cholangiogram for chronic cholecystitis as well as reduction and primary repair of incarcerated incisional hernias by Dr. Johney Maine on 01/25/21. She was discharged after POD 1 on 7/21..   Then she was admitted from 7/23-7/28 for acute hypoxic respiratory failure secondary to acute on chronic diastolic CHF. She had worsening renal insufficiency following aggressive IV diuresis with Lasix and her home losartan and Aldactone was discontinued. She was also treated for GNR UTI with Rocephin and Cefdinir.   She was then seen at [facility] on 7/29 for concerns of redness, swelling and drainage from abdominal incision. Her Cefdinir was then changed to Bactrim in order to cover her UTI and abdominal wall cellulitis.  Following our appointment with surgery she began to develop worsening lower abdominal pain especially around her incision sites.  She also noticed worsening bloody drainage around her periumbilical incision site.  Also then developed a fever up to 101.35F at home with developing chills and worsening weakness.  She denies any nausea, vomiting.  Denies any dysuria but has issues with urgency since she had bladder sling surgery for overactive bladder in April.  ED Course: She was febrile up to 103.1, mildly tachycardic.  Had leukocytosis of 12.4.  Hemoglobin stable at 11.9.  Creatinine elevated to 1.44  from prior of 0.91. UA pending.  General surgery was consulted and performed I&D of her umbilical incision at bedside with drainage of moderate amount of turbid serous fluid consistent with an infected seroma.  Later she had a repeat CT of the abdomen showing increased fluid in the gallbladder of fossa which surgery thinks is more postoperative changes.  She was started on IV vancomycin and cefepime for her cellulitis and hospitalist was called for admission.   Review of Systems: Constitutional: No Weight Change, + Fever ENT/Mouth: No sore throat, No Rhinorrhea Eyes: No Eye Pain, No Vision Changes Cardiovascular: No Chest Pain, no SOB,no edema Respiratory: No Cough, No Sputum  Gastrointestinal: No Nausea, No Vomiting, No Diarrhea, No Constipation, + Pain Genitourinary: no Urinary Incontinence, No Urgency, No Flank Pain Musculoskeletal: No Arthralgias, No Myalgias Skin: No Skin Lesions, No Pruritus, Neuro: no Weakness, No Numbness Psych: No Anxiety/Panic, No Depression, no decrease appetite Heme/Lymph: No Bruising, No Bleeding  Past Medical History:  Diagnosis Date   Chronic stable angina (HCC)    Coronary artery disease cardiologist--- dr Burt Knack   hx NSTEMI  w/ cardiac cath 03-16-2005 PCI with DES to LCx;   cardiac cath 06-27-2005  PCI with DES to RCA with residual dx LAD manage medcially;  lexiscan 01-26-20211 normal no ischemia, ef 70%;  stress echo w/ dobutamine 10-24-2011 negative ishcmeia , normal ef // Myoview 2/22: EF 67, no ischemia or infarction, no TID, low risk    Diabetes mellitus type 2, diet-controlled ([place])    followed by pcp  (10-19-2020  pt stated checks daily in am,  fasting blood surgar--- 115--120s)   DOE (dyspnea on exertion)    per pt "when I  over do",  ok with household chores   Echocardiogram 08/2020    Echocardiogram 2/22: EF 55-60, no RWMA, mild LVH, Gr 2 DD, GLS-21.7%, normal RVSF, trivial MR, RVSP 39.5   Edema of right lower extremity    GERD  (gastroesophageal reflux disease)    Hiatal hernia    recurrence,  hx HH repair 1989   History of cervical cancer    s/p  vaginal hysterectomy   History of DVT of lower extremity 2016   11-29-2014 post op right TKA of right lower extremity and completed xarelto    History of esophageal stricture    hx s/p dilatation's   History of gastric ulcer 2005 approx.   History of palpitations 2010   event monitor 07-07-2009 showed NSR w/ freq. SVT ectopies with short runs, rare PVCs   History of TIA (transient ischemic attack) 06/1999   12-15-2019  per pt had several TIA between 12/ 2000 to 02/ 2001 , was sent to specialist [facility] , had test that was normal (10-19-2020 pt stated no TIAs since ) but has residual of essential tremors of right arm/ hand   Hypertension    Intermittent palpitations    IT band syndrome    Migraine    "ice pick headche lasts about 30 seconds"   Mixed hyperlipidemia    Mixed incontinence urge and stress    Multiple thyroid nodules    followed by pcp---   ultrasound 11-22-2014 no bx   (12-15-2019 per pt had a endocrinologist and was told did not need bx)   OA (osteoarthritis)    knees, elbow, hip, ankles   Occasional tremors    right arm/ hand  s/p TIA residual 2000   Osteoporosis    taking vitamin d   Peroneal DVT (deep venous thrombosis) ([place]) 12/22/2014   Right bundle branch block (RBBB) with left anterior fascicular block (LAFB)    RLS (restless legs syndrome)    S/P drug eluting coronary stent placement 2006   03-16-2005  PCI x1 DES to LCx;   06-27-2005  PCI x1 DES to RCA   Urinary retention    post op sling prodecure on 10-27-2020, has foley cathether    Past Surgical History:  Procedure Laterality Date   ANTERIOR AND POSTERIOR REPAIR N/A 12/22/2019   Procedure: ANTERIOR (CYSTOCELE)  REPAIR;  Surgeon: Janyth Contes, MD;  Location: [facility];  Service: Gynecology;  Laterality: N/A;   ANTERIOR AND POSTERIOR REPAIR WITH SACROSPINOUS  FIXATION N/A 10/27/2020   Procedure: SACROSPINOUS LIGAMENT FIXATION;  Surgeon: Jaquita Folds, MD;  Location[facility];  Service: Gynecology;  Laterality: N/A;   BLADDER SUSPENSION N/A 10/27/2020   Procedure: TRANSVAGINAL TAPE (TVT) PROCEDURE;  Surgeon: Jaquita Folds, MD;  Location: [facility];  Service: Gynecology;  Laterality: N/A;   CATARACT EXTRACTION W/ INTRAOCULAR LENS  IMPLANT, BILATERAL  2015   CHOLECYSTECTOMY N/A 01/25/2021   Procedure: LAPAROSCOPIC CHOLECYSTECTOMY WITH INTRAOPERATIVE CHOLANGIOGRAM AND LYSIS OF ADHESIONS;  Surgeon: Michael Boston, MD;  Location: [facility];  Service: General;  Laterality: N/A;   COLONOSCOPY  last one ?   CORONARY ANGIOPLASTY WITH STENT PLACEMENT  03-16-2005   dr Lia Foyer   PCI and DES x1 to LCx   CORONARY ANGIOPLASTY WITH STENT PLACEMENT  06-27-2005  dr Lia Foyer   PCI and DES x1 to RCA with residual disease LAD 70-80% to manage medically   CYSTOSCOPY N/A 10/27/2020   Procedure: CYSTOSCOPY;  Surgeon: Jaquita Folds, MD;  Location: [facility]  [place];  Service: Gynecology;  Laterality: N/A;   CYSTOSCOPY N/A 11/09/2020   Procedure: CYSTOSCOPY;  Surgeon: Jaquita Folds, MD;  Location: [facility];  Service: Gynecology;  Laterality: N/A;   FOOT SURGERY Left 1990s   left foot stress fracture repair, per pt no hardware   HIATAL HERNIA REPAIR  1989   INCISIONAL HERNIA REPAIR N/A 01/25/2021   Procedure: PRIMARY REPAIR OF INCISIONAL HERNIA;  Surgeon: Michael Boston, MD;  Location: [facility];  Service: General;  Laterality: N/A;   KNEE ARTHROSCOPY Bilateral right ?/   left x2 , last one 09-12-2009 @ [facility]   PUBOVAGINAL SLING N/A 11/09/2020   Procedure: [place];  Surgeon: Jaquita Folds, MD;  Location: [facility];  Service: Gynecology;  Laterality: N/A;   RECTOCELE REPAIR N/A 04/20/2020   Procedure: POSTERIOR REPAIR (RECTOCELE);  Surgeon:  Janyth Contes, MD;  Location: [facility];  Service: Gynecology;  Laterality: N/A;   RECTOCELE REPAIR N/A 10/27/2020   Procedure: POSTERIOR REPAIR (RECTOCELE);  Surgeon: Jaquita Folds, MD;  Location: [facility];  Service: Gynecology;  Laterality: N/A;  total time requested for all procedures is 2 hours   TOTAL KNEE ARTHROPLASTY  11/12/2011   Procedure: TOTAL KNEE ARTHROPLASTY;  Surgeon: Lorn Junes, MD;  Location: [facility];  Service: Orthopedics;  Laterality: Left;  Dr Noemi Chapel wants 90 minutes for this case   TOTAL KNEE ARTHROPLASTY Right 11/29/2014   Procedure: RIGHT TOTAL KNEE ARTHROPLASTY;  Surgeon: Vickey Huger, MD;  Location: [facility];  Service: Orthopedics;  Laterality: Right;   UPPER GASTROINTESTINAL ENDOSCOPY  last one 04-25-2017   with dilatation esophageal stricture and savary dilatation   VAGINAL HYSTERECTOMY  1988    no ovaries removed for bleeeding     reports that she has never smoked. She has never used smokeless tobacco. She reports that she does not drink alcohol and does not use drugs. Social History  Allergies  Allergen Reactions   Baclofen Other (See Comments)    Hyperactivity    Nitrofurantoin Rash    Family History  Problem Relation Age of Onset   Stroke Father        family hx of M 1st degree relative <50   Coronary artery disease Mother    Heart disease Mother    Depression Brother    Stroke Brother    Diabetes Brother    Cancer Brother        bladder with mets   Diabetes Daughter        borderline   Hypertension Daughter    Arthritis Other        family hx of   Hypertension Other        family hx of   Other Other        family hx of cardiovascular disorder   Thyroid disease Daughter    Breast cancer Neg Hx    Colon cancer Neg Hx    Anesthesia problems Neg Hx    Hypotension Neg Hx    Malignant hyperthermia Neg Hx    Pseudochol deficiency Neg Hx    Colon polyps Neg Hx    Esophageal cancer Neg Hx     Rectal cancer Neg Hx    Stomach cancer Neg Hx      Prior to Admission medications   Medication Sig Start Date End Date Taking? Authorizing Provider  acetaminophen (TYLENOL) 500 MG tablet Take 1,000 mg by mouth every 6 (six)  hours as needed for moderate pain.   Yes [provider]  amLODipine (NORVASC) 10 MG tablet TAKE 1 TABLET EVERY DAY 02/01/21  Yes Mosie Lukes, MD  Ascorbic Acid (VITAMIN C) 500 MG CHEW Chew 500 mg by mouth daily.   Yes [provider]  aspirin EC 81 MG tablet Take 1 tablet (81 mg total) by mouth daily. Patient taking differently: Take 81 mg by mouth in the morning. 05/04/15  Yes [name], MD  Blood Glucose Monitoring Suppl (TRUE METRIX AIR GLUCOSE METER) w/Device KIT USE TO CHECK BLOOD SUGAR ONCE DAILY AND AS NEEDED.  DX CODE E11.9 07/27/20  Yes Mosie Lukes, MD  cefdinir (OMNICEF) 300 MG capsule Take 1 capsule (300 mg total) by mouth 2 (two) times daily for 4 days. 02/01/21 02/05/21 Yes [place] Donnamarie Poag, DO  Cholecalciferol (VITAMIN D) 50 MCG (2000 UT) CAPS Take 2,000 Units by mouth daily.    Yes [provider]  diclofenac Sodium (VOLTAREN) 1 % GEL Apply 1 application topically 4 (four) times daily as needed (hip pain).   Yes [provider]  famotidine (PEPCID) 20 MG tablet Take 20 mg by mouth at bedtime as needed for heartburn or indigestion.   Yes [provider]  furosemide (LASIX) 20 MG tablet Take 1 tablet (20 mg total) by mouth daily as needed for edema (weight gain greater than 3 pounds in 1 day or 5 pounds in 1 week). 02/01/21 05/02/21 Yes [place] Eric J, DO  glucose blood (TRUE METRIX BLOOD GLUCOSE TEST) test strip USE TO CHECK BLOOD SUGAR ONCE A DAY OR AS NEEDED.  DX CODE: E11.9 07/27/20  Yes Mosie Lukes, MD  HYDROcodone-acetaminophen (NORCO/VICODIN) 5-325 MG tablet Take by mouth. 02/03/21 02/08/21 Yes [provider]  isosorbide mononitrate (IMDUR) 60 MG 24 hr tablet Take 1.5 tablets (90 mg total) by  mouth daily. Patient taking differently: Take 90 mg by mouth every evening. 12/14/20 12/14/21 Yes Weaver, Scott T, PA-C  ketorolac (TORADOL) 10 MG tablet Take 1 tablet (10 mg total) by mouth every 6 (six) hours as needed. Patient taking differently: Take 10 mg by mouth every 6 (six) hours as needed (pain). 10/30/20  Yes Jaquita Folds, MD  magnesium oxide (MAG-OX) 400 MG tablet Take 400 mg by mouth at bedtime.   Yes [provider]  nitroGLYCERIN (NITROSTAT) 0.4 MG SL tablet Place 1 tablet (0.4 mg total) under the tongue every 5 (five) minutes as needed for chest pain. Patient taking differently: Place 0.4 mg under the tongue every 5 (five) minutes x 3 doses as needed for chest pain. 08/29/20 08/29/21 Yes Weaver, Scott T, PA-C  omeprazole (PRILOSEC) 20 MG capsule TAKE 1 CAPSULE EVERY DAY AS NEEDED Patient taking differently: Take 20 mg by mouth in the morning. 01/02/21  Yes Mosie Lukes, MD  ondansetron (ZOFRAN ODT) 4 MG disintegrating tablet Take 1 tablet (4 mg total) by mouth every 6 (six) hours as needed for nausea or vomiting. 12/15/19  Yes Armbruster, Carlota Raspberry, MD  potassium chloride SA (KLOR-CON) 20 MEQ tablet Take 0.5 tablets (10 mEq total) by mouth daily. Patient taking differently: Take 10 mEq by mouth every evening. 09/14/20  Yes Weaver, Scott T, PA-C  pramipexole (MIRAPEX) 0.25 MG tablet Take 1 tablet (0.25 mg total) by mouth 2 (two) times daily. 09/26/20  Yes Mosie Lukes, MD  simvastatin (ZOCOR) 20 MG tablet TAKE 1 TABLET AT BEDTIME Patient taking differently: Take 20 mg by mouth every evening. 09/01/20  Yes Weaver, Scott T, PA-C  traMADol (ULTRAM) 50 MG tablet Take 1 tablet (50 mg total) by mouth every 6 (six) hours as needed for severe pain. 01/26/21  Yes Michael Boston, MD  TRUEplus Lancets 33G MISC USE TO CHECK BLOOD SUGAR ONCE DAILY AND AS NEEDED.  DX CODE: E11.9 07/27/20  Yes Mosie Lukes, MD  Zinc 50 MG TABS Take 50 mg by mouth daily.   Yes [provider]   metoprolol succinate (TOPROL-XL) 50 MG 24 hr tablet Take 1.5 tablets (75 mg total) by mouth at bedtime. Take with or immediately following a meal. 12/14/20 12/14/21  [name] T, PA-C  mirabegron ER (MYRBETRIQ) 25 MG TB24 tablet Take 1 tablet (25 mg total) by mouth daily. 01/23/21   Jaquita Folds, MD  sulfamethoxazole-trimethoprim (BACTRIM DS) 800-160 MG tablet Take 1 tablet by mouth 2 (two) times daily. 02/03/21   [provider]    Physical Exam: Vitals:   02/04/21 2323 02/04/21 2345 02/05/21 0000 02/05/21 0015  BP:  (!) 119/41 115/60 (!) 121/42  Pulse:  73 69 70  Resp:    16  Temp: (!) 103.1 F (39.5 C)     TempSrc: Rectal     SpO2:  95% 94% 93%  Weight:      Height:        Constitutional: NAD, calm, comfortable, drowsy appearing female with generalized pallor laying at 20 degree incline in bed Vitals:   02/04/21 2323 02/04/21 2345 02/05/21 0000 02/05/21 0015  BP:  (!) 119/41 115/60 (!) 121/42  Pulse:  73 69 70  Resp:    16  Temp: (!) 103.1 F (39.5 C)     TempSrc: Rectal     SpO2:  95% 94% 93%  Weight:      Height:       Eyes: PERRL, lids and conjunctivae normal ENMT: Mucous membranes are moist.  Neck: normal, supple Respiratory: clear to auscultation bilaterally, no wheezing, no crackles. Normal respiratory effort. No accessory muscle use.  Cardiovascular: Regular rate and rhythm, no murmurs / rubs / gallops. No extremity edema.  Abdomen: mild diffuse tenderness, large area of erythema with increased warmth surrounding vertical incision above the umbilical region with bloody drainage.  Several healing nonerythematous incision sites scattered throughout the abdomen. musculoskeletal: no clubbing / cyanosis. No joint deformity upper and lower extremities. Good ROM, no contractures. Normal muscle tone.  Skin: no rashes, lesions, ulcers. No induration Neurologic: CN 2-12 grossly intact. Sensation intact, DTR normal. Strength 5/5 in all 4.  Psychiatric: Normal  judgment and insight. Alert and oriented x 3. Normal mood.    Labs on Admission: I have personally reviewed following labs and imaging studies  CBC: Recent Labs  Lab 01/29/21 0125 01/30/21 0417 01/31/21 0457 02/01/21 0450 02/04/21 2130  WBC 8.5 6.2 8.4 7.5 12.4*  NEUTROABS  --   --   --   --  11.9*  HGB 11.6* 11.2* 10.9* 11.6* 11.9*  HCT 35.0* 33.7* 33.1* 35.9* 36.3  MCV 92.8 93.4 93.8 93.0 92.6  PLT 139* 144* 157 186 681   Basic Metabolic Panel: Recent Labs  Lab 01/29/21 0125 01/30/21 0417 01/31/21 0457 02/01/21 0450 02/02/21 0501 02/04/21 2130  NA 132* 132* 133* 137 135 131*  K 4.0 3.7 4.1 4.0 4.0 4.6  CL 97* 95* 95* 98 98 99  CO2 '25 28 28 24 26 23  ' GLUCOSE 109* 124* 144* 110* 118* 118*  BUN 21 22 29* 24* 17 19  CREATININE 1.08*  1.10* 2.07* 1.39* 0.91 1.44*  CALCIUM 8.8* 8.5* 8.6* 9.0 8.4* 8.7*  MG 1.8  --  2.0 2.0  --   --   PHOS  --   --  4.0 3.5  --   --    GFR: Estimated Creatinine Clearance: 26.4 mL/min (A) (by C-G formula based on SCr of 1.44 mg/dL (H)). Liver Function Tests: Recent Labs  Lab 01/30/21 0417 01/31/21 0457 02/01/21 0450 02/04/21 2130  AST 33 20  --  28  ALT 57* 42  --  27  ALKPHOS 188* 159*  --  164*  BILITOT 0.9 0.9  --  0.6  PROT 5.7* 5.8*  --  6.6  ALBUMIN 2.9* 3.0* 3.1* 3.1*   No results for input(s): LIPASE, AMYLASE in the last 168 hours. No results for input(s): AMMONIA in the last 168 hours. Coagulation Profile: No results for input(s): INR, PROTIME in the last 168 hours. Cardiac Enzymes: Recent Labs  Lab 01/31/21 0457  CKTOTAL 30*   BNP (last 3 results) Recent Labs    08/19/20 1351  PROBNP 428   HbA1C: No results for input(s): HGBA1C in the last 72 hours. CBG: Recent Labs  Lab 02/01/21 1153 02/01/21 1718 02/01/21 2146 02/02/21 0730 02/02/21 1213  GLUCAP 218* 89 116* 106* 114*   Lipid Profile: No results for input(s): CHOL, HDL, LDLCALC, TRIG, CHOLHDL, LDLDIRECT in the last 72 hours. Thyroid Function  Tests: No results for input(s): TSH, T4TOTAL, FREET4, T3FREE, THYROIDAB in the last 72 hours. Anemia Panel: No results for input(s): VITAMINB12, FOLATE, FERRITIN, TIBC, IRON, RETICCTPCT in the last 72 hours. Urine analysis:    Component Value Date/Time   COLORURINE STRAW (A) 01/31/2021 1249   APPEARANCEUR TURBID (A) 01/31/2021 1249   LABSPEC 1.014 01/31/2021 1249   PHURINE 5.0 01/31/2021 1249   GLUCOSEU NEGATIVE 01/31/2021 1249   GLUCOSEU NEGATIVE 09/21/2016 1103   HGBUR SMALL (A) 01/31/2021 1249   BILIRUBINUR NEGATIVE 01/31/2021 1249   BILIRUBINUR neg 11/24/2020 1204   KETONESUR NEGATIVE 01/31/2021 1249   PROTEINUR 100 (A) 01/31/2021 1249   UROBILINOGEN 0.2 11/24/2020 1204   UROBILINOGEN 0.2 09/21/2016 1103   NITRITE NEGATIVE 01/31/2021 1249   LEUKOCYTESUR MODERATE (A) 01/31/2021 1249    Radiological Exams on Admission: CT ABDOMEN PELVIS W CONTRAST  Result Date: 02/04/2021 CLINICAL DATA:  Abdominal pain. Gallbladder inter knee a surgery 720, recent hospital discharge. Drainage from incision and fever. EXAM: CT ABDOMEN AND PELVIS WITH CONTRAST TECHNIQUE: Multidetector CT imaging of the abdomen and pelvis was performed using the standard protocol following bolus administration of intravenous contrast. CONTRAST:  2m OMNIPAQUE IOHEXOL 350 MG/ML SOLN COMPARISON:  Postoperative CT 01/28/2021 FINDINGS: Lower chest: Diminished pleural effusions. Improving basilar atelectasis, more so on the right. Stable moderate-sized hiatal hernia. Coronary artery calcifications. Hepatobiliary: Again seen low-density adjacent to the falciform ligament. No intrahepatic fluid collection. Post recent cholecystectomy. Increased size of fluid collection in the gallbladder fossa currently measuring 4 cm, previously 3.3 cm. Collection contains heterogeneous fluid and air, with small amount of fluid and air tracking superficial to the anterior liver. Minimal periportal edema without biliary dilatation. No common bile  duct dilatation. Pancreas: No ductal dilatation or inflammation. Spleen: Normal in size without focal abnormality. Adrenals/Urinary Tract: Normal adrenal glands. No hydronephrosis or renal calculi. Stable cysts in the right kidney. Lobulated renal contours again seen. Urinary bladder is partially distended. Focus of air in the urinary bladder likely related to prior Foley catheter. Equivocal bladder wall thickening. Stomach/Bowel: Moderate hiatal hernia, similar to  prior. Decompressed stomach. There is no small bowel obstruction or inflammation. High-density barium within the colon from prior esophagram. Normal appendix. Left colonic diverticulosis without diverticulitis. No colonic inflammation. Vascular/Lymphatic: Moderate aortic atherosclerosis. Patent portal vein. No acute vascular findings. No enlarged lymph nodes in the abdomen or pelvis Reproductive: Status post hysterectomy. No adnexal masses. Other: Trace air in the cholecystectomy bed that tracks anterior in peripheral to the liver. Trace perihepatic fluid. Gallbladder fossa fluid collection as described above. No other focal fluid collection. No significant ascites/free fluid. There is postsurgical change of the anterior abdominal wall. There is irregularity at the skin surface with small foci of gas in the subcutaneous tissues but no drainable collection. Expected stranding of the subcutaneous tissues. Minimal stranding in the subjacent omental fat. Similar appearance of fat containing hernia just above postsurgical change, hernia neck spanning 4.4 cm transverse, series 2, image 32. Minimal air in the anterior abdominal wall which is improving from prior exam. Musculoskeletal: Stable degenerative change in the spine. No acute findings. IMPRESSION: 1. Post recent cholecystectomy. Increased size of fluid collection in the gallbladder fossa currently measuring 4 cm, previously 3.3 cm. Collection contains heterogeneous fluid and air, with small amount of  fluid and air tracking superficial to the anterior liver. Differential considerations include in postoperative seroma, +/- infection, or biloma. 2. Postsurgical change of the anterior abdominal wall with irregular skin surface and small foci of gas in the subcutaneous tissues but no drainable collection. 3. Similar appearance of fat containing hernia just above postsurgical change. 4. Equivocal bladder wall thickening. 5. Colonic diverticulosis without diverticulitis. 6. Diminished pleural effusions and basilar atelectasis. 7. Moderate hiatal hernia. Aortic Atherosclerosis (ICD10-I70.0). Electronically Signed   By: Keith Rake M.D.   On: 02/04/2021 23:56   DG Chest Portable 1 View  Result Date: 02/04/2021 CLINICAL DATA:  Shortness of breath. EXAM: PORTABLE CHEST 1 VIEW COMPARISON:  Radiograph and chest CT 01/28/2021. FINDINGS: Lower lung volumes from prior exam. Patient is rotated. Grossly stable heart size and mediastinal contours. Increasing right basilar opacity. Small pleural effusions. No pulmonary edema. No pneumothorax. IMPRESSION: 1. Lower lung volumes from prior exam. Increasing right basilar opacity, favor atelectasis over pneumonia. 2. Small pleural effusions. Electronically Signed   By: Keith Rake M.D.   On: 02/04/2021 21:44      Assessment/Plan  Sepsis secondary to cellulitis of postoperative umbilical incision site -Patient presented with fever and leukocytosis -Patient is status post laparoscopic cholecystectomy and incarcerated paraumbilical incisional hernia. Later developed cellulitis around the site and had an undrained abscess.  This was drained at bedside by General surgery Dr. Zenia Resides and fluid consistent with infected serous fluid - Fluid in gallbladder fossa seen on CT likely postoperative changes noninfection -Continue IV vancomycin and cefepime -Blood cultures pending.  UA pending.  AKI - Creatinine of 1.44 from 0.91 -Give one-time IV 500 cc bolus. Will need to  monitor fluid status closely since she was recently admitted for CHF exac  HTN -continue amlodipine  Chronic diastolic CHF -Continue Imdur and metoprolol  CAD s/p remote stent -continue statin   RLS  -Continue pramiprexole  DVT prophylaxis:.Lovenox Code Status: Full Family Communication: Plan discussed with patient and daughter at bedside  disposition Plan: Home with observation Consults called: General surgery Admission status: Observation  Level of care: Med-Surg  Status is: Observation  The patient remains OBS appropriate and will d/c before 2 midnights.  Dispo: The patient is from: Home  Anticipated d/c is to: Home              Patient currently is not medically stable to d/c.   Difficult to place patient No         Orene Desanctis DO [facility]   If 7PM-7AM, please contact night-coverage [email or web address]   02/05/2021, 12:53 AM

## 2021-02-05 NOTE — Progress Notes (Signed)
Dr Wyline Copas aware of pt c/o seeing "fine red vessel pooling blood at bottom of eye" in the lt eye. Denies pain. VSS. Grips and legs strong bilaterally. PERRLA. Facial symmetry. MD ordered CT of head.

## 2021-02-05 NOTE — Progress Notes (Signed)
Patient was admitted from ED, d/t Sepsis.  Pt had temp 103 in ED and was given tylenol.  On arrival to [facility] patient temp was 98.8.  Patient BP was soft at 123/54 and HR 66.  Pt was settle in on 1303.  Pt is alert and oriented.  Was assisted to use the [facility].  Patient's subsequent BP was even lower at 95/32  recheck  104/35 HR 59.  Pt follows command and is oriented X4.  Pt is presently receiving 500 cc of NS and RR nurse has been called. Will notify Md after [place] with vitals.

## 2021-02-05 NOTE — Progress Notes (Signed)
Subjective: Patient had a fever to 39.5 last night, has been afebrile since then. Soft BP this morning. Blood and urine cultures are in process.   Objective: Vital signs in last 24 hours: Temp:  [97.8 F (36.6 C)-103.1 F (39.5 C)] 97.8 F (36.6 C) (07/31 0903) Pulse Rate:  [41-78] 41 (07/31 0903) Resp:  [16-18] 16 (07/31 0903) BP: (95-166)/(32-69) 111/47 (07/31 0903) SpO2:  [93 %-98 %] 96 % (07/31 0903) Weight:  [68.9 kg] 68.9 kg (07/30 2050)    Intake/Output from previous day: No intake/output data recorded. Intake/Output this shift: No intake/output data recorded.  PE: General: resting comfortably, NAD Neuro: alert, no focal deficits Resp: normal work of breathing on nasal cannula Abdomen: soft, nondistended, nontender to palpation. Midline wound is open at the central portion, there is serous drainage but no frank pus. Mild improvement in surrounding erythema and induration.  Extremities: warm and well-perfused   Lab Results:  Recent Labs    02/04/21 2130  WBC 12.4*  HGB 11.9*  HCT 36.3  PLT 291   BMET Recent Labs    02/04/21 2130  NA 131*  K 4.6  CL 99  CO2 23  GLUCOSE 118*  BUN 19  CREATININE 1.44*  CALCIUM 8.7*   PT/INR No results for input(s): LABPROT, INR in the last 72 hours. CMP     Component Value Date/Time   NA 131 (L) 02/04/2021 2130   NA 139 10/03/2020 1138   K 4.6 02/04/2021 2130   CL 99 02/04/2021 2130   CO2 23 02/04/2021 2130   GLUCOSE 118 (H) 02/04/2021 2130   BUN 19 02/04/2021 2130   BUN 23 10/03/2020 1138   CREATININE 1.44 (H) 02/04/2021 2130   CREATININE 0.95 11/10/2013 0328   CALCIUM 8.7 (L) 02/04/2021 2130   PROT 6.6 02/04/2021 2130   ALBUMIN 3.1 (L) 02/04/2021 2130   AST 28 02/04/2021 2130   ALT 27 02/04/2021 2130   ALKPHOS 164 (H) 02/04/2021 2130   BILITOT 0.6 02/04/2021 2130   GFRNONAA 36 (L) 02/04/2021 2130   GFRAA 57 (L) 08/19/2020 1351   Lipase     Component Value Date/Time   LIPASE 25 01/28/2021  1923       Studies/Results: CT ABDOMEN PELVIS W CONTRAST  Result Date: 02/04/2021 CLINICAL DATA:  Abdominal pain. Gallbladder inter knee a surgery 720, recent hospital discharge. Drainage from incision and fever. EXAM: CT ABDOMEN AND PELVIS WITH CONTRAST TECHNIQUE: Multidetector CT imaging of the abdomen and pelvis was performed using the standard protocol following bolus administration of intravenous contrast. CONTRAST:  36m OMNIPAQUE IOHEXOL 350 MG/ML SOLN COMPARISON:  Postoperative CT 01/28/2021 FINDINGS: Lower chest: Diminished pleural effusions. Improving basilar atelectasis, more so on the right. Stable moderate-sized hiatal hernia. Coronary artery calcifications. Hepatobiliary: Again seen low-density adjacent to the falciform ligament. No intrahepatic fluid collection. Post recent cholecystectomy. Increased size of fluid collection in the gallbladder fossa currently measuring 4 cm, previously 3.3 cm. Collection contains heterogeneous fluid and air, with small amount of fluid and air tracking superficial to the anterior liver. Minimal periportal edema without biliary dilatation. No common bile duct dilatation. Pancreas: No ductal dilatation or inflammation. Spleen: Normal in size without focal abnormality. Adrenals/Urinary Tract: Normal adrenal glands. No hydronephrosis or renal calculi. Stable cysts in the right kidney. Lobulated renal contours again seen. Urinary bladder is partially distended. Focus of air in the urinary bladder likely related to prior Foley catheter. Equivocal bladder wall thickening. Stomach/Bowel: Moderate hiatal hernia, similar to  prior. Decompressed stomach. There is no small bowel obstruction or inflammation. High-density barium within the colon from prior esophagram. Normal appendix. Left colonic diverticulosis without diverticulitis. No colonic inflammation. Vascular/Lymphatic: Moderate aortic atherosclerosis. Patent portal vein. No acute vascular findings. No enlarged  lymph nodes in the abdomen or pelvis Reproductive: Status post hysterectomy. No adnexal masses. Other: Trace air in the cholecystectomy bed that tracks anterior in peripheral to the liver. Trace perihepatic fluid. Gallbladder fossa fluid collection as described above. No other focal fluid collection. No significant ascites/free fluid. There is postsurgical change of the anterior abdominal wall. There is irregularity at the skin surface with small foci of gas in the subcutaneous tissues but no drainable collection. Expected stranding of the subcutaneous tissues. Minimal stranding in the subjacent omental fat. Similar appearance of fat containing hernia just above postsurgical change, hernia neck spanning 4.4 cm transverse, series 2, image 32. Minimal air in the anterior abdominal wall which is improving from prior exam. Musculoskeletal: Stable degenerative change in the spine. No acute findings. IMPRESSION: 1. Post recent cholecystectomy. Increased size of fluid collection in the gallbladder fossa currently measuring 4 cm, previously 3.3 cm. Collection contains heterogeneous fluid and air, with small amount of fluid and air tracking superficial to the anterior liver. Differential considerations include in postoperative seroma, +/- infection, or biloma. 2. Postsurgical change of the anterior abdominal wall with irregular skin surface and small foci of gas in the subcutaneous tissues but no drainable collection. 3. Similar appearance of fat containing hernia just above postsurgical change. 4. Equivocal bladder wall thickening. 5. Colonic diverticulosis without diverticulitis. 6. Diminished pleural effusions and basilar atelectasis. 7. Moderate hiatal hernia. Aortic Atherosclerosis (ICD10-I70.0). Electronically Signed   By: Keith Rake M.D.   On: 02/04/2021 23:56   DG Chest Portable 1 View  Result Date: 02/04/2021 CLINICAL DATA:  Shortness of breath. EXAM: PORTABLE CHEST 1 VIEW COMPARISON:  Radiograph and chest  CT 01/28/2021. FINDINGS: Lower lung volumes from prior exam. Patient is rotated. Grossly stable heart size and mediastinal contours. Increasing right basilar opacity. Small pleural effusions. No pulmonary edema. No pneumothorax. IMPRESSION: 1. Lower lung volumes from prior exam. Increasing right basilar opacity, favor atelectasis over pneumonia. 2. Small pleural effusions. Electronically Signed   By: Keith Rake M.D.   On: 02/04/2021 21:44    Anti-infectives: Anti-infectives (From admission, onward)    Start     Dose/Rate Route Frequency Ordered Stop   02/06/21 0200  vancomycin (VANCOREADY) IVPB 1250 mg/250 mL        1,250 mg 166.7 mL/hr over 90 Minutes Intravenous Every 24 hours 02/05/21 0227     02/05/21 0030  vancomycin (VANCOCIN) IVPB 1000 mg/200 mL premix        1,000 mg 200 mL/hr over 60 Minutes Intravenous  Once 02/05/21 0015 02/05/21 0535   02/05/21 0030  ceFEPIme (MAXIPIME) 2 g in sodium chloride 0.9 % 100 mL IVPB        2 g 200 mL/hr over 30 Minutes Intravenous Every 24 hours 02/05/21 0015          Assessment/Plan  86 yo female 11 days s/p lap chole and primary incisional hernia repair, with fevers and wound infection. - Wound care: gently pack abdominal wound with dry gauze wick, cover with dry gauze dressing.  - Full sepsis workup in progress. Patient is on  vanc/cefepime. - I reviewed abdominal CT scan from last night - there is some fluid and gas in the gallbladder fossa but this is not clearly an  abscess or biloma. Fluid is expected in this location postoperatively. If it were a biloma I would expect the bilirubin to be at least mildly elevated. If patient has persistent fevers can consider drainage of this, but will discuss with the patient's primary surgeon. - Ok for regular diet from a surgical standpoint - We will continue to follow     LOS: 0 days    Michaelle Birks, MD [facility], Hepatobiliary and Pancreatic Surgery 02/05/21 10:26  AM

## 2021-02-05 NOTE — Progress Notes (Signed)
Dr. Wyline Copas entered order for Tylenol.

## 2021-02-05 NOTE — Progress Notes (Signed)
PROGRESS NOTE    Heather [name]  [id number] DOB: Sep 17, 1934 DOA: 02/04/2021 PCP: Mosie Lukes, MD    Brief Narrative:   86 y.o. female with medical history significant for [place], CAD, history of DVT, type 2 diabetes, GERD, hyperlipidemia and recent laparoscopic cholecystectomy who presents with concerns of increasing abdominal pain, incisional drainage and fever.  Pt was admitted for management of sepsis with cellulitis around incision site  Assessment & Plan:   Principal Problem:   Sepsis [facility]) Active Problems:   Essential hypertension   Coronary atherosclerosis   RLS (restless legs syndrome)   Chronic calculous cholecystitis s/p lap cholecystectomy 01/25/2021   AKI (acute kidney injury) ([place])   Chronic diastolic CHF (congestive heart failure) ([place])  Sepsis secondary to cellulitis of postoperative umbilical incision site -Patient presented with fever and leukocytosis -Patient is status post laparoscopic cholecystectomy and incarcerated paraumbilical incisional hernia. Later developed cellulitis around the site and had an undrained abscess. This was drained at bedside by General surgery  -Pt is continued on empiric vanc with cefepime -no longer febrile.   AKI - Creatinine of 1.44 from 0.91 on presentation -Give one-time IV 500 cc bolus.  -Continue to encourage hydration -Repeat bmet in AM  HTN -continue amlodipine   Chronic diastolic CHF -Continue Imdur and metoprolol   CAD s/p remote stent -continue statin as tolerated   RLS -Continue pramiprexole as tolerated    DVT prophylaxis: Lovenox subq Code Status: Full Family Communication: Pt in room, family not at bedside  Status is: Observation  The patient will require care spanning > 2 midnights and should be moved to inpatient because: Inpatient level of care appropriate due to severity of illness  Dispo: The patient is from: Home              Anticipated d/c is to: Home              Patient currently is  not medically stable to d/c.   Difficult to place patient No       Consultants:  General Surgery  Procedures:    Antimicrobials: Anti-infectives (From admission, onward)    Start     Dose/Rate Route Frequency Ordered Stop   02/06/21 0200  vancomycin (VANCOREADY) IVPB 1250 mg/250 mL        1,250 mg 166.7 mL/hr over 90 Minutes Intravenous Every 24 hours 02/05/21 0227     02/05/21 0030  vancomycin (VANCOCIN) IVPB 1000 mg/200 mL premix        1,000 mg 200 mL/hr over 60 Minutes Intravenous  Once 02/05/21 0015 02/05/21 0535   02/05/21 0030  ceFEPIme (MAXIPIME) 2 g in sodium chloride 0.9 % 100 mL IVPB        2 g 200 mL/hr over 30 Minutes Intravenous Every 24 hours 02/05/21 0015         Subjective: Complaining of abd discomfort  Objective: Vitals:   02/05/21 0903 02/05/21 1155 02/05/21 1401 02/05/21 1419  BP: (!) 111/47 (!) 117/46 (!) 147/46   Pulse: (!) 41 (!) 51 (!) 59   Resp: '16 16 16   '$ Temp: 97.8 F (36.6 C) 97.8 F (36.6 C) 98.4 F (36.9 C) 98.7 F (37.1 C)  TempSrc: Oral  Oral Oral  SpO2: 96% 95% 95%   Weight:      Height:        Intake/Output Summary (Last 24 hours) at 02/05/2021 1501 Last data filed at 02/05/2021 1400 Gross per 24 hour  Intake --  Output 1 ml  Net -1 ml   Filed Weights   02/04/21 2050  Weight: 68.9 kg    Examination: General exam: Awake, laying in bed, in nad Respiratory system: Normal respiratory effort, no wheezing Cardiovascular system: regular rate, s1, s2 Gastrointestinal system: Soft, nondistended, positive BS Central nervous system: CN2-12 grossly intact, strength intact Extremities: Perfused, no clubbing Skin: Normal skin turgor, no notable skin lesions seen Psychiatry: Mood normal // no visual hallucinations   Data Reviewed: I have personally reviewed following labs and imaging studies  CBC: Recent Labs  Lab 01/30/21 0417 01/31/21 0457 02/01/21 0450 02/04/21 2130  WBC 6.2 8.4 7.5 12.4*  NEUTROABS  --   --   --   11.9*  HGB 11.2* 10.9* 11.6* 11.9*  HCT 33.7* 33.1* 35.9* 36.3  MCV 93.4 93.8 93.0 92.6  PLT 144* 157 186 [id number]   Basic Metabolic Panel: Recent Labs  Lab 01/30/21 0417 01/31/21 0457 02/01/21 0450 02/02/21 0501 02/04/21 2130  NA 132* 133* 137 135 131*  K 3.7 4.1 4.0 4.0 4.6  CL 95* 95* 98 98 99  CO2 '28 28 24 26 23  '$ GLUCOSE 124* 144* 110* 118* 118*  BUN 22 29* 24* 17 19  CREATININE 1.10* 2.07* 1.39* 0.91 1.44*  CALCIUM 8.5* 8.6* 9.0 8.4* 8.7*  MG  --  2.0 2.0  --   --   PHOS  --  4.0 3.5  --   --    GFR: Estimated Creatinine Clearance: 26.4 mL/min (A) (by C-G formula based on SCr of 1.44 mg/dL (H)). Liver Function Tests: Recent Labs  Lab 01/30/21 0417 01/31/21 0457 02/01/21 0450 02/04/21 2130  AST 33 20  --  28  ALT 57* 42  --  27  ALKPHOS 188* 159*  --  164*  BILITOT 0.9 0.9  --  0.6  PROT 5.7* 5.8*  --  6.6  ALBUMIN 2.9* 3.0* 3.1* 3.1*   No results for input(s): LIPASE, AMYLASE in the last 168 hours. No results for input(s): AMMONIA in the last 168 hours. Coagulation Profile: No results for input(s): INR, PROTIME in the last 168 hours. Cardiac Enzymes: Recent Labs  Lab 01/31/21 0457  CKTOTAL 30*   BNP (last 3 results) Recent Labs    08/19/20 1351  PROBNP 428   HbA1C: No results for input(s): HGBA1C in the last 72 hours. CBG: Recent Labs  Lab 02/01/21 1718 02/01/21 2146 02/02/21 0730 02/02/21 1213 02/05/21 0419  GLUCAP 89 116* 106* 114* 133*   Lipid Profile: No results for input(s): CHOL, HDL, LDLCALC, TRIG, CHOLHDL, LDLDIRECT in the last 72 hours. Thyroid Function Tests: No results for input(s): TSH, T4TOTAL, FREET4, T3FREE, THYROIDAB in the last 72 hours. Anemia Panel: No results for input(s): VITAMINB12, FOLATE, FERRITIN, TIBC, IRON, RETICCTPCT in the last 72 hours. Sepsis Labs: Recent Labs  Lab 02/04/21 2130 02/05/21 0517  LATICACIDVEN 1.7 1.5    Recent Results (from the past 240 hour(s))  Resp Panel by RT-PCR (Flu A&B, Covid)  Nasopharyngeal Swab     Status: None   Collection Time: 01/28/21  7:23 PM   Specimen: Nasopharyngeal Swab; Nasopharyngeal(NP) swabs in vial transport medium  Result Value Ref Range Status   SARS Coronavirus 2 by RT PCR NEGATIVE NEGATIVE Final    Comment: (NOTE) SARS-CoV-2 target nucleic acids are NOT DETECTED.  The SARS-CoV-2 RNA is generally detectable in upper respiratory specimens during the acute phase of infection. The lowest concentration of SARS-CoV-2 viral copies this assay can detect is 138 copies/mL. A negative result does  not preclude SARS-Cov-2 infection and should not be used as the sole basis for treatment or other patient management decisions. A negative result may occur with  improper specimen collection/handling, submission of specimen other than nasopharyngeal swab, presence of viral mutation(s) within the areas targeted by this assay, and inadequate number of viral copies(<138 copies/mL). A negative result must be combined with clinical observations, patient history, and epidemiological information. The expected result is Negative.  Fact Sheet for Patients:  EntrepreneurPulse.com.au  Fact Sheet for Healthcare Providers:  IncredibleEmployment.be  This test is no t yet approved or cleared by the [place] FDA and  has been authorized for detection and/or diagnosis of SARS-CoV-2 by FDA under an Emergency Use Authorization (EUA). This EUA will remain  in effect (meaning this test can be used) for the duration of the COVID-19 declaration under Section 564(b)(1) of the Act, 21 U.S.C.section 360bbb-3(b)(1), unless the authorization is terminated  or revoked sooner.       Influenza A by PCR NEGATIVE NEGATIVE Final   Influenza B by PCR NEGATIVE NEGATIVE Final    Comment: (NOTE) The Xpert Xpress SARS-CoV-2/FLU/RSV plus assay is intended as an aid in the diagnosis of influenza from Nasopharyngeal swab specimens and should not be  used as a sole basis for treatment. Nasal washings and aspirates are unacceptable for Xpert Xpress SARS-CoV-2/FLU/RSV testing.  Fact Sheet for Patients: EntrepreneurPulse.com.au  Fact Sheet for Healthcare Providers: IncredibleEmployment.be  This test is not yet approved or cleared by the [place] FDA and has been authorized for detection and/or diagnosis of SARS-CoV-2 by FDA under an Emergency Use Authorization (EUA). This EUA will remain in effect (meaning this test can be used) for the duration of the COVID-19 declaration under Section 564(b)(1) of the Act, 21 U.S.C. section 360bbb-3(b)(1), unless the authorization is terminated or revoked.  Performed at [facility], [place] [address], [place], [place] [address]   Urine Culture     Status: Abnormal   Collection Time: 01/31/21 12:49 PM   Specimen: Urine, Clean Catch  Result Value Ref Range Status   Specimen Description   Final    URINE, CLEAN CATCH Performed at [facility], [place] [address], [place], [place] [address]    Special Requests   Final    NONE Performed at [facility], [place] [address] [place], [place] [address]    Culture >=100,000 COLONIES/mL KLEBSIELLA OXYTOCA (A)  Final   Report Status 02/02/2021 FINAL  Final   Organism ID, Bacteria KLEBSIELLA OXYTOCA (A)  Final      Susceptibility   Klebsiella oxytoca - MIC*    AMPICILLIN >=32 RESISTANT Resistant     CEFAZOLIN 8 SENSITIVE Sensitive     CEFEPIME <=0.12 SENSITIVE Sensitive     CEFTRIAXONE <=0.25 SENSITIVE Sensitive     CIPROFLOXACIN <=0.25 SENSITIVE Sensitive     GENTAMICIN <=1 SENSITIVE Sensitive     IMIPENEM <=0.25 SENSITIVE Sensitive     NITROFURANTOIN <=16 SENSITIVE Sensitive     TRIMETH/SULFA <=20 SENSITIVE Sensitive     AMPICILLIN/SULBACTAM 16 INTERMEDIATE Intermediate     PIP/TAZO <=4 SENSITIVE Sensitive     * >=100,000 COLONIES/mL KLEBSIELLA OXYTOCA   Resp Panel by RT-PCR (Flu A&B, Covid) Nasopharyngeal Swab     Status: None   Collection Time: 02/04/21  9:30 PM   Specimen: Nasopharyngeal Swab; Nasopharyngeal(NP) swabs in vial transport medium  Result Value Ref Range Status   SARS Coronavirus 2 by RT PCR NEGATIVE NEGATIVE Final    Comment: (  NOTE) SARS-CoV-2 target nucleic acids are NOT DETECTED.  The SARS-CoV-2 RNA is generally detectable in upper respiratory specimens during the acute phase of infection. The lowest concentration of SARS-CoV-2 viral copies this assay can detect is 138 copies/mL. A negative result does not preclude SARS-Cov-2 infection and should not be used as the sole basis for treatment or other patient management decisions. A negative result may occur with  improper specimen collection/handling, submission of specimen other than nasopharyngeal swab, presence of viral mutation(s) within the areas targeted by this assay, and inadequate number of viral copies(<138 copies/mL). A negative result must be combined with clinical observations, patient history, and epidemiological information. The expected result is Negative.  Fact Sheet for Patients:  EntrepreneurPulse.com.au  Fact Sheet for Healthcare Providers:  IncredibleEmployment.be  This test is no t yet approved or cleared by the [place] FDA and  has been authorized for detection and/or diagnosis of SARS-CoV-2 by FDA under an Emergency Use Authorization (EUA). This EUA will remain  in effect (meaning this test can be used) for the duration of the COVID-19 declaration under Section 564(b)(1) of the Act, 21 U.S.C.section 360bbb-3(b)(1), unless the authorization is terminated  or revoked sooner.       Influenza A by PCR NEGATIVE NEGATIVE Final   Influenza B by PCR NEGATIVE NEGATIVE Final    Comment: (NOTE) The Xpert Xpress SARS-CoV-2/FLU/RSV plus assay is intended as an aid in the diagnosis of influenza from  Nasopharyngeal swab specimens and should not be used as a sole basis for treatment. Nasal washings and aspirates are unacceptable for Xpert Xpress SARS-CoV-2/FLU/RSV testing.  Fact Sheet for Patients: EntrepreneurPulse.com.au  Fact Sheet for Healthcare Providers: IncredibleEmployment.be  This test is not yet approved or cleared by the [place] FDA and has been authorized for detection and/or diagnosis of SARS-CoV-2 by FDA under an Emergency Use Authorization (EUA). This EUA will remain in effect (meaning this test can be used) for the duration of the COVID-19 declaration under Section 564(b)(1) of the Act, 21 U.S.C. section 360bbb-3(b)(1), unless the authorization is terminated or revoked.  Performed at [facility], [place] [address], [place], [place] [address]      Radiology Studies: CT HEAD WO CONTRAST  Result Date: 02/05/2021 CLINICAL DATA:  Transient ischemic attack (TIA). Additional history provided: Patient reports "floaters" in eyes. EXAM: CT HEAD WITHOUT CONTRAST TECHNIQUE: Contiguous axial images were obtained from the base of the skull through the vertex without intravenous contrast. COMPARISON:  Brain MRI 08/13/2017.  Head CT 04/01/2013. FINDINGS: Brain: Mild generalized parenchymal atrophy. Mild-to-moderate patchy and ill-defined hypoattenuation within the cerebral white matter, nonspecific but compatible with chronic small vessel ischemic disease. Bilateral basal ganglia mineralization. There is no acute intracranial hemorrhage. No demarcated cortical infarct. No extra-axial fluid collection. No evidence of an intracranial mass. No midline shift. Vascular: No hyperdense vessel.  Atherosclerotic calcifications Skull: Normal. Negative for fracture or focal lesion. Sinuses/Orbits: Visualized orbits show no acute finding. No significant paranasal sinus disease at the imaged levels. IMPRESSION: No evidence of acute  intracranial abnormality. Mild-to-moderate chronic small vessel ischemic changes within the cerebral white matter, progressed from the head CT of 04/01/2013. Mild generalized parenchymal atrophy. Electronically Signed   By: Kellie Simmering DO   On: 02/05/2021 13:18   CT ABDOMEN PELVIS W CONTRAST  Result Date: 02/04/2021 CLINICAL DATA:  Abdominal pain. Gallbladder inter knee a surgery 720, recent hospital discharge. Drainage from incision and fever. EXAM: CT ABDOMEN AND PELVIS WITH CONTRAST TECHNIQUE: Multidetector CT imaging of  the abdomen and pelvis was performed using the standard protocol following bolus administration of intravenous contrast. CONTRAST:  83m OMNIPAQUE IOHEXOL 350 MG/ML SOLN COMPARISON:  Postoperative CT 01/28/2021 FINDINGS: Lower chest: Diminished pleural effusions. Improving basilar atelectasis, more so on the right. Stable moderate-sized hiatal hernia. Coronary artery calcifications. Hepatobiliary: Again seen low-density adjacent to the falciform ligament. No intrahepatic fluid collection. Post recent cholecystectomy. Increased size of fluid collection in the gallbladder fossa currently measuring 4 cm, previously 3.3 cm. Collection contains heterogeneous fluid and air, with small amount of fluid and air tracking superficial to the anterior liver. Minimal periportal edema without biliary dilatation. No common bile duct dilatation. Pancreas: No ductal dilatation or inflammation. Spleen: Normal in size without focal abnormality. Adrenals/Urinary Tract: Normal adrenal glands. No hydronephrosis or renal calculi. Stable cysts in the right kidney. Lobulated renal contours again seen. Urinary bladder is partially distended. Focus of air in the urinary bladder likely related to prior Foley catheter. Equivocal bladder wall thickening. Stomach/Bowel: Moderate hiatal hernia, similar to prior. Decompressed stomach. There is no small bowel obstruction or inflammation. High-density barium within the colon  from prior esophagram. Normal appendix. Left colonic diverticulosis without diverticulitis. No colonic inflammation. Vascular/Lymphatic: Moderate aortic atherosclerosis. Patent portal vein. No acute vascular findings. No enlarged lymph nodes in the abdomen or pelvis Reproductive: Status post hysterectomy. No adnexal masses. Other: Trace air in the cholecystectomy bed that tracks anterior in peripheral to the liver. Trace perihepatic fluid. Gallbladder fossa fluid collection as described above. No other focal fluid collection. No significant ascites/free fluid. There is postsurgical change of the anterior abdominal wall. There is irregularity at the skin surface with small foci of gas in the subcutaneous tissues but no drainable collection. Expected stranding of the subcutaneous tissues. Minimal stranding in the subjacent omental fat. Similar appearance of fat containing hernia just above postsurgical change, hernia neck spanning 4.4 cm transverse, series 2, image 32. Minimal air in the anterior abdominal wall which is improving from prior exam. Musculoskeletal: Stable degenerative change in the spine. No acute findings. IMPRESSION: 1. Post recent cholecystectomy. Increased size of fluid collection in the gallbladder fossa currently measuring 4 cm, previously 3.3 cm. Collection contains heterogeneous fluid and air, with small amount of fluid and air tracking superficial to the anterior liver. Differential considerations include in postoperative seroma, +/- infection, or biloma. 2. Postsurgical change of the anterior abdominal wall with irregular skin surface and small foci of gas in the subcutaneous tissues but no drainable collection. 3. Similar appearance of fat containing hernia just above postsurgical change. 4. Equivocal bladder wall thickening. 5. Colonic diverticulosis without diverticulitis. 6. Diminished pleural effusions and basilar atelectasis. 7. Moderate hiatal hernia. Aortic Atherosclerosis  (ICD10-I70.0). Electronically Signed   By: MKeith RakeM.D.   On: 02/04/2021 23:56   DG Chest Portable 1 View  Result Date: 02/04/2021 CLINICAL DATA:  Shortness of breath. EXAM: PORTABLE CHEST 1 VIEW COMPARISON:  Radiograph and chest CT 01/28/2021. FINDINGS: Lower lung volumes from prior exam. Patient is rotated. Grossly stable heart size and mediastinal contours. Increasing right basilar opacity. Small pleural effusions. No pulmonary edema. No pneumothorax. IMPRESSION: 1. Lower lung volumes from prior exam. Increasing right basilar opacity, favor atelectasis over pneumonia. 2. Small pleural effusions. Electronically Signed   By: MKeith RakeM.D.   On: 02/04/2021 21:44    Scheduled Meds:  ascorbic acid  500 mg Oral Daily   aspirin EC  81 mg Oral q AM   cholecalciferol  2,000 Units Oral Daily  enoxaparin (LOVENOX) injection  30 mg Subcutaneous Q24H   isosorbide mononitrate  90 mg Oral QPM   magnesium oxide  400 mg Oral Daily   pantoprazole  40 mg Oral Daily   potassium chloride SA  10 mEq Oral QPM   pramipexole  0.25 mg Oral BID   simvastatin  20 mg Oral QPM   zinc sulfate  220 mg Oral Daily   Continuous Infusions:  ceFEPime (MAXIPIME) IV 2 g (02/05/21 0246)   [START ON 02/06/2021] vancomycin       LOS: 0 days   Marylu Lund, MD [facility] Pager On Amion  If 7PM-7AM, please contact night-coverage 02/05/2021, 3:01 PM

## 2021-02-06 ENCOUNTER — Inpatient Hospital Stay (HOSPITAL_COMMUNITY): Payer: Medicare HMO

## 2021-02-06 DIAGNOSIS — K8010 Calculus of gallbladder with chronic cholecystitis without obstruction: Secondary | ICD-10-CM | POA: Diagnosis not present

## 2021-02-06 LAB — COMPREHENSIVE METABOLIC PANEL
ALT: 20 U/L (ref 0–44)
AST: 24 U/L (ref 15–41)
Albumin: 2.6 g/dL — ABNORMAL LOW (ref 3.5–5.0)
Alkaline Phosphatase: 112 U/L (ref 38–126)
Anion gap: 11 (ref 5–15)
BUN: 25 mg/dL — ABNORMAL HIGH (ref 8–23)
CO2: 21 mmol/L — ABNORMAL LOW (ref 22–32)
Calcium: 8 mg/dL — ABNORMAL LOW (ref 8.9–10.3)
Chloride: 100 mmol/L (ref 98–111)
Creatinine, Ser: 1.46 mg/dL — ABNORMAL HIGH (ref 0.44–1.00)
GFR, Estimated: 35 mL/min — ABNORMAL LOW (ref >60)
Glucose, Bld: 93 mg/dL (ref 70–99)
Potassium: 4.8 mmol/L (ref 3.5–5.1)
Sodium: 132 mmol/L — ABNORMAL LOW (ref 135–145)
Total Bilirubin: 0.7 mg/dL (ref 0.3–1.2)
Total Protein: 5.4 g/dL — ABNORMAL LOW (ref 6.5–8.1)

## 2021-02-06 LAB — URINE CULTURE: Culture: <10000 — AB

## 2021-02-06 LAB — CBC
HCT: 29.4 % — ABNORMAL LOW (ref 36.0–46.0)
Hemoglobin: 9.5 g/dL — ABNORMAL LOW (ref 12.0–15.0)
MCH: 30.2 pg (ref 26.0–34.0)
MCHC: 32.3 g/dL (ref 30.0–36.0)
MCV: 93.3 fL (ref 80.0–100.0)
Platelets: 222 10*3/uL (ref 150–400)
RBC: 3.15 MIL/uL — ABNORMAL LOW (ref 3.87–5.11)
RDW: 13 % (ref 11.5–15.5)
WBC: 18.8 10*3/uL — ABNORMAL HIGH (ref 4.0–10.5)
nRBC: 0 % (ref 0.0–0.2)

## 2021-02-06 MED ORDER — TECHNETIUM TC 99M MEBROFENIN IV KIT
5.5000 | PACK | Freq: Once | INTRAVENOUS | Status: AC
  Administered 2021-02-06: 5.5 via INTRAVENOUS

## 2021-02-06 MED ORDER — LACTATED RINGERS IV SOLN: INTRAVENOUS | Status: DC

## 2021-02-06 MED ORDER — PANTOPRAZOLE SODIUM 40 MG PO TBEC
40.0000 mg | DELAYED_RELEASE_TABLET | Freq: Every day | ORAL | Status: DC
  Administered 2021-02-06 – 2021-02-09 (×4): 40 mg via ORAL
  Filled 2021-02-06 (×4): qty 1

## 2021-02-06 NOTE — Progress Notes (Signed)
PROGRESS NOTE    Heather [name]  [id number] DOB: 19-Nov-1934 DOA: 02/04/2021 PCP: Mosie Lukes, MD    Brief Narrative:   86 y.o. female with medical history significant for [place], CAD, history of DVT, type 2 diabetes, GERD, hyperlipidemia and recent laparoscopic cholecystectomy who presents with concerns of increasing abdominal pain, incisional drainage and fever.  Pt was admitted for management of sepsis with cellulitis around incision site  Assessment & Plan:   Principal Problem:   Sepsis [facility]) Active Problems:   Essential hypertension   Coronary atherosclerosis   RLS (restless legs syndrome)   Chronic calculous cholecystitis s/p lap cholecystectomy 01/25/2021   AKI (acute kidney injury) ([place])   Chronic diastolic CHF (congestive heart failure) ([place])   Sepsis due to cellulitis ([place])  Sepsis likely secondary to cellulitis of postoperative umbilical incision site -Patient presented with fever and leukocytosis -Patient is status post laparoscopic cholecystectomy and incarcerated paraumbilical incisional hernia. Later developed cellulitis around the site and had an undrained abscess. -Fluid collection initially drained on presentation by Surgery -Pt is continued on empiric vanc with cefepime empirically -WBC is up to 18.8 this AM -Urine cx thus far with insignificant growth. Presenting UA unremarkable after pt was treated for klebsiella UTI from last admit -Pt now s/p HIDA per Surgery, reviewed, unremarkable -Will repeat cmp and cbc in AM   AKI - Creatinine of 1.44 from 0.91 on presentation -Cr up to 1.46 today -Bladder distension noted on presenting CT abd -Will check bladder scan, I/O cath if >300cc -Have started LR at 96 -Recheck bmet in AM  HTN -on imdur  -Home norvasc, metoprolol on hold secondary to soft BP at presentation -BP currently well controlled   Chronic diastolic CHF -Continue Imdur -metoprolol on hold secondary to soft bp at presentation   CAD s/p  remote stent -continue statin as tolerated   RLS -Continue pramiprexole as tolerated  Recent vision change - Recently noted floaters in L eye with what pt describes as blood vessels in her visual field with blood - Symptoms were intermittent and today, pt denies further symptoms - Had discussed case with Ophthalmology who will see pt in consultation  DVT prophylaxis: Lovenox subq Code Status: Full Family Communication: Pt in room, family not at bedside  Status is: Inpatient  The patient will require care spanning > 2 midnights and should be moved to inpatient because: Inpatient level of care appropriate due to severity of illness  Dispo: The patient is from: Home              Anticipated d/c is to: Home              Patient currently is not medically stable to d/c.   Difficult to place patient No  Consultants:  General Surgery  Procedures:    Antimicrobials: Anti-infectives (From admission, onward)    Start     Dose/Rate Route Frequency Ordered Stop   02/06/21 0200  vancomycin (VANCOREADY) IVPB 1250 mg/250 mL        1,250 mg 166.7 mL/hr over 90 Minutes Intravenous Every 24 hours 02/05/21 0227     02/05/21 0030  vancomycin (VANCOCIN) IVPB 1000 mg/200 mL premix        1,000 mg 200 mL/hr over 60 Minutes Intravenous  Once 02/05/21 0015 02/05/21 0535   02/05/21 0030  ceFEPIme (MAXIPIME) 2 g in sodium chloride 0.9 % 100 mL IVPB        2 g 200 mL/hr over 30 Minutes Intravenous Every 24  hours 02/05/21 0015         Subjective: Without complaints this afternoon, reports feeling better  Objective: Vitals:   02/05/21 2353 02/06/21 0409 02/06/21 0926 02/06/21 1359  BP: (!) 118/42 (!) 116/48 (!) 113/42 (!) 128/51  Pulse: (!) 58 64 (!) 50 75  Resp: '16 16 18 18  '$ Temp: 97.9 F (36.6 C) 97.7 F (36.5 C) 98.3 F (36.8 C) 99 F (37.2 C)  TempSrc: Oral Oral Oral Oral  SpO2: 95% 98% 98% 97%  Weight:      Height:        Intake/Output Summary (Last 24 hours) at 02/06/2021  1637 Last data filed at 02/06/2021 1000 Gross per 24 hour  Intake 452.81 ml  Output 300 ml  Net 152.81 ml    Filed Weights   02/04/21 2050  Weight: 68.9 kg    Examination: General exam: Conversant, in no acute distress Respiratory system: normal chest rise, clear, no audible wheezing Cardiovascular system: regular rhythm, s1-s2 Gastrointestinal system: Nondistended, nontender, pos BS Central nervous system: No seizures, no tremors Extremities: No cyanosis, no joint deformities Skin: No rashes, no pallor Psychiatry: Affect normal // no auditory hallucinations   Data Reviewed: I have personally reviewed following labs and imaging studies  CBC: Recent Labs  Lab 01/31/21 0457 02/01/21 0450 02/04/21 2130 02/06/21 0355  WBC 8.4 7.5 12.4* 18.8*  NEUTROABS  --   --  11.9*  --   HGB 10.9* 11.6* 11.9* 9.5*  HCT 33.1* 35.9* 36.3 29.4*  MCV 93.8 93.0 92.6 93.3  PLT 157 186 291 [id number]    Basic Metabolic Panel: Recent Labs  Lab 01/31/21 0457 02/01/21 0450 02/02/21 0501 02/04/21 2130 02/06/21 0355  NA 133* 137 135 131* 132*  K 4.1 4.0 4.0 4.6 4.8  CL 95* 98 98 99 100  CO2 '28 24 26 23 '$ 21*  GLUCOSE 144* 110* 118* 118* 93  BUN 29* 24* 17 19 25*  CREATININE 2.07* 1.39* 0.91 1.44* 1.46*  CALCIUM 8.6* 9.0 8.4* 8.7* 8.0*  MG 2.0 2.0  --   --   --   PHOS 4.0 3.5  --   --   --     GFR: Estimated Creatinine Clearance: 26.1 mL/min (A) (by C-G formula based on SCr of 1.46 mg/dL (H)). Liver Function Tests: Recent Labs  Lab 01/31/21 0457 02/01/21 0450 02/04/21 2130 02/06/21 0355  AST 20  --  28 24  ALT 42  --  27 20  ALKPHOS 159*  --  164* 112  BILITOT 0.9  --  0.6 0.7  PROT 5.8*  --  6.6 5.4*  ALBUMIN 3.0* 3.1* 3.1* 2.6*    No results for input(s): LIPASE, AMYLASE in the last 168 hours. No results for input(s): AMMONIA in the last 168 hours. Coagulation Profile: No results for input(s): INR, PROTIME in the last 168 hours. Cardiac Enzymes: Recent Labs  Lab  01/31/21 0457  CKTOTAL 30*    BNP (last 3 results) Recent Labs    08/19/20 1351  PROBNP 428    HbA1C: No results for input(s): HGBA1C in the last 72 hours. CBG: Recent Labs  Lab 02/01/21 1718 02/01/21 2146 02/02/21 0730 02/02/21 1213 02/05/21 0419  GLUCAP 89 116* 106* 114* 133*    Lipid Profile: No results for input(s): CHOL, HDL, LDLCALC, TRIG, CHOLHDL, LDLDIRECT in the last 72 hours. Thyroid Function Tests: No results for input(s): TSH, T4TOTAL, FREET4, T3FREE, THYROIDAB in the last 72 hours. Anemia Panel: No results for input(s): VITAMINB12, FOLATE,  FERRITIN, TIBC, IRON, RETICCTPCT in the last 72 hours. Sepsis Labs: Recent Labs  Lab 02/04/21 2130 02/05/21 0517  LATICACIDVEN 1.7 1.5     Recent Results (from the past 240 hour(s))  Resp Panel by RT-PCR (Flu A&B, Covid) Nasopharyngeal Swab     Status: None   Collection Time: 01/28/21  7:23 PM   Specimen: Nasopharyngeal Swab; Nasopharyngeal(NP) swabs in vial transport medium  Result Value Ref Range Status   SARS Coronavirus 2 by RT PCR NEGATIVE NEGATIVE Final    Comment: (NOTE) SARS-CoV-2 target nucleic acids are NOT DETECTED.  The SARS-CoV-2 RNA is generally detectable in upper respiratory specimens during the acute phase of infection. The lowest concentration of SARS-CoV-2 viral copies this assay can detect is 138 copies/mL. A negative result does not preclude SARS-Cov-2 infection and should not be used as the sole basis for treatment or other patient management decisions. A negative result may occur with  improper specimen collection/handling, submission of specimen other than nasopharyngeal swab, presence of viral mutation(s) within the areas targeted by this assay, and inadequate number of viral copies(<138 copies/mL). A negative result must be combined with clinical observations, patient history, and epidemiological information. The expected result is Negative.  Fact Sheet for Patients:   EntrepreneurPulse.com.au  Fact Sheet for Healthcare Providers:  IncredibleEmployment.be  This test is no t yet approved or cleared by the [place] FDA and  has been authorized for detection and/or diagnosis of SARS-CoV-2 by FDA under an Emergency Use Authorization (EUA). This EUA will remain  in effect (meaning this test can be used) for the duration of the COVID-19 declaration under Section 564(b)(1) of the Act, 21 U.S.C.section 360bbb-3(b)(1), unless the authorization is terminated  or revoked sooner.       Influenza A by PCR NEGATIVE NEGATIVE Final   Influenza B by PCR NEGATIVE NEGATIVE Final    Comment: (NOTE) The Xpert Xpress SARS-CoV-2/FLU/RSV plus assay is intended as an aid in the diagnosis of influenza from Nasopharyngeal swab specimens and should not be used as a sole basis for treatment. Nasal washings and aspirates are unacceptable for Xpert Xpress SARS-CoV-2/FLU/RSV testing.  Fact Sheet for Patients: EntrepreneurPulse.com.au  Fact Sheet for Healthcare Providers: IncredibleEmployment.be  This test is not yet approved or cleared by the [place] FDA and has been authorized for detection and/or diagnosis of SARS-CoV-2 by FDA under an Emergency Use Authorization (EUA). This EUA will remain in effect (meaning this test can be used) for the duration of the COVID-19 declaration under Section 564(b)(1) of the Act, 21 U.S.C. section 360bbb-3(b)(1), unless the authorization is terminated or revoked.  Performed at [facility], [place] [address] [place], [place] [address]   Urine Culture     Status: Abnormal   Collection Time: 01/31/21 12:49 PM   Specimen: Urine, Clean Catch  Result Value Ref Range Status   Specimen Description   Final    URINE, CLEAN CATCH Performed at [facility], [place] [address] [place], [place] [address]    Special Requests    Final    NONE Performed at [facility], [place] [address], [place], [place] [address]    Culture >=100,000 COLONIES/mL KLEBSIELLA OXYTOCA (A)  Final   Report Status 02/02/2021 FINAL  Final   Organism ID, Bacteria KLEBSIELLA OXYTOCA (A)  Final      Susceptibility   Klebsiella oxytoca - MIC*    AMPICILLIN >=32 RESISTANT Resistant     CEFAZOLIN 8 SENSITIVE Sensitive  CEFEPIME <=0.12 SENSITIVE Sensitive     CEFTRIAXONE <=0.25 SENSITIVE Sensitive     CIPROFLOXACIN <=0.25 SENSITIVE Sensitive     GENTAMICIN <=1 SENSITIVE Sensitive     IMIPENEM <=0.25 SENSITIVE Sensitive     NITROFURANTOIN <=16 SENSITIVE Sensitive     TRIMETH/SULFA <=20 SENSITIVE Sensitive     AMPICILLIN/SULBACTAM 16 INTERMEDIATE Intermediate     PIP/TAZO <=4 SENSITIVE Sensitive     * >=100,000 COLONIES/mL KLEBSIELLA OXYTOCA  Culture, blood (routine x 2)     Status: None (Preliminary result)   Collection Time: 02/04/21  9:30 PM   Specimen: BLOOD  Result Value Ref Range Status   Specimen Description   Final    BLOOD SITE NOT SPECIFIED Performed at [place] [address], [place], [place] [address]    Special Requests   Final    BOTTLES DRAWN AEROBIC AND ANAEROBIC Blood Culture adequate volume Performed at [place] [address], [place], [place] [address]    Culture   Final    NO GROWTH 1 DAY Performed at [facility], [place] [address], [place], [place] [address]    Report Status PENDING  Incomplete  Resp Panel by RT-PCR (Flu A&B, Covid) Nasopharyngeal Swab     Status: None   Collection Time: 02/04/21  9:30 PM   Specimen: Nasopharyngeal Swab; Nasopharyngeal(NP) swabs in vial transport medium  Result Value Ref Range Status   SARS Coronavirus 2 by RT PCR NEGATIVE NEGATIVE Final    Comment: (NOTE) SARS-CoV-2 target nucleic acids are NOT DETECTED.  The SARS-CoV-2 RNA is generally detectable in upper respiratory specimens during the acute phase  of infection. The lowest concentration of SARS-CoV-2 viral copies this assay can detect is 138 copies/mL. A negative result does not preclude SARS-Cov-2 infection and should not be used as the sole basis for treatment or other patient management decisions. A negative result may occur with  improper specimen collection/handling, submission of specimen other than nasopharyngeal swab, presence of viral mutation(s) within the areas targeted by this assay, and inadequate number of viral copies(<138 copies/mL). A negative result must be combined with clinical observations, patient history, and epidemiological information. The expected result is Negative.  Fact Sheet for Patients:  EntrepreneurPulse.com.au  Fact Sheet for Healthcare Providers:  IncredibleEmployment.be  This test is no t yet approved or cleared by the [place] FDA and  has been authorized for detection and/or diagnosis of SARS-CoV-2 by FDA under an Emergency Use Authorization (EUA). This EUA will remain  in effect (meaning this test can be used) for the duration of the COVID-19 declaration under Section 564(b)(1) of the Act, 21 U.S.C.section 360bbb-3(b)(1), unless the authorization is terminated  or revoked sooner.       Influenza A by PCR NEGATIVE NEGATIVE Final   Influenza B by PCR NEGATIVE NEGATIVE Final    Comment: (NOTE) The Xpert Xpress SARS-CoV-2/FLU/RSV plus assay is intended as an aid in the diagnosis of influenza from Nasopharyngeal swab specimens and should not be used as a sole basis for treatment. Nasal washings and aspirates are unacceptable for Xpert Xpress SARS-CoV-2/FLU/RSV testing.  Fact Sheet for Patients: EntrepreneurPulse.com.au  Fact Sheet for Healthcare Providers: IncredibleEmployment.be  This test is not yet approved or cleared by the [place] FDA and has been authorized for detection and/or diagnosis of  SARS-CoV-2 by FDA under an Emergency Use Authorization (EUA). This EUA will remain in effect (meaning this test can be used) for the duration of the COVID-19 declaration under  Section 564(b)(1) of the Act, 21 U.S.C. section 360bbb-3(b)(1), unless the authorization is terminated or revoked.  Performed at [facility], [place] [address], [place], [place] [address]   Urine Culture     Status: Abnormal   Collection Time: 02/05/21  2:06 AM   Specimen: Urine, Clean Catch  Result Value Ref Range Status   Specimen Description   Final    URINE, CLEAN CATCH Performed at [facility], [place] [address] [place], [place] [address]    Special Requests   Final    NONE Performed at [facility], [place] [address], [place], [place] [address]    Culture (A)  Final    <10,000 COLONIES/mL INSIGNIFICANT GROWTH Performed at [place] [address] [place], [place] [address]    Report Status 02/06/2021 FINAL  Final  Culture, blood (routine x 2)     Status: None (Preliminary result)   Collection Time: 02/05/21  5:17 AM   Specimen: BLOOD  Result Value Ref Range Status   Specimen Description   Final    BLOOD RIGHT ANTECUBITAL Performed at [place] [address] [place], [place] [address]    Special Requests   Final    BOTTLES DRAWN AEROBIC ONLY Blood Culture adequate volume Performed at [place] [address], [place], [place] [address]    Culture   Final    NO GROWTH 1 DAY Performed at [facility], [place] [address] [place], [place] [address]    Report Status PENDING  Incomplete      Radiology Studies: CT HEAD WO CONTRAST  Result Date: 02/05/2021 CLINICAL DATA:  Transient ischemic attack (TIA). Additional history provided: Patient reports "floaters" in eyes. EXAM: CT HEAD WITHOUT CONTRAST TECHNIQUE: Contiguous axial images were obtained from the base of the skull through the  vertex without intravenous contrast. COMPARISON:  Brain MRI 08/13/2017.  Head CT 04/01/2013. FINDINGS: Brain: Mild generalized parenchymal atrophy. Mild-to-moderate patchy and ill-defined hypoattenuation within the cerebral white matter, nonspecific but compatible with chronic small vessel ischemic disease. Bilateral basal ganglia mineralization. There is no acute intracranial hemorrhage. No demarcated cortical infarct. No extra-axial fluid collection. No evidence of an intracranial mass. No midline shift. Vascular: No hyperdense vessel.  Atherosclerotic calcifications Skull: Normal. Negative for fracture or focal lesion. Sinuses/Orbits: Visualized orbits show no acute finding. No significant paranasal sinus disease at the imaged levels. IMPRESSION: No evidence of acute intracranial abnormality. Mild-to-moderate chronic small vessel ischemic changes within the cerebral white matter, progressed from the head CT of 04/01/2013. Mild generalized parenchymal atrophy. Electronically Signed   By: Kellie Simmering DO   On: 02/05/2021 13:18   CT ABDOMEN PELVIS W CONTRAST  Result Date: 02/04/2021 CLINICAL DATA:  Abdominal pain. Gallbladder inter knee a surgery 720, recent hospital discharge. Drainage from incision and fever. EXAM: CT ABDOMEN AND PELVIS WITH CONTRAST TECHNIQUE: Multidetector CT imaging of the abdomen and pelvis was performed using the standard protocol following bolus administration of intravenous contrast. CONTRAST:  34m OMNIPAQUE IOHEXOL 350 MG/ML SOLN COMPARISON:  Postoperative CT 01/28/2021 FINDINGS: Lower chest: Diminished pleural effusions. Improving basilar atelectasis, more so on the right. Stable moderate-sized hiatal hernia. Coronary artery calcifications. Hepatobiliary: Again seen low-density adjacent to the falciform ligament. No intrahepatic fluid collection. Post recent cholecystectomy. Increased size of fluid collection in the gallbladder fossa currently measuring 4 cm, previously 3.3 cm.  Collection contains heterogeneous fluid and air, with small amount of fluid and air tracking  superficial to the anterior liver. Minimal periportal edema without biliary dilatation. No common bile duct dilatation. Pancreas: No ductal dilatation or inflammation. Spleen: Normal in size without focal abnormality. Adrenals/Urinary Tract: Normal adrenal glands. No hydronephrosis or renal calculi. Stable cysts in the right kidney. Lobulated renal contours again seen. Urinary bladder is partially distended. Focus of air in the urinary bladder likely related to prior Foley catheter. Equivocal bladder wall thickening. Stomach/Bowel: Moderate hiatal hernia, similar to prior. Decompressed stomach. There is no small bowel obstruction or inflammation. High-density barium within the colon from prior esophagram. Normal appendix. Left colonic diverticulosis without diverticulitis. No colonic inflammation. Vascular/Lymphatic: Moderate aortic atherosclerosis. Patent portal vein. No acute vascular findings. No enlarged lymph nodes in the abdomen or pelvis Reproductive: Status post hysterectomy. No adnexal masses. Other: Trace air in the cholecystectomy bed that tracks anterior in peripheral to the liver. Trace perihepatic fluid. Gallbladder fossa fluid collection as described above. No other focal fluid collection. No significant ascites/free fluid. There is postsurgical change of the anterior abdominal wall. There is irregularity at the skin surface with small foci of gas in the subcutaneous tissues but no drainable collection. Expected stranding of the subcutaneous tissues. Minimal stranding in the subjacent omental fat. Similar appearance of fat containing hernia just above postsurgical change, hernia neck spanning 4.4 cm transverse, series 2, image 32. Minimal air in the anterior abdominal wall which is improving from prior exam. Musculoskeletal: Stable degenerative change in the spine. No acute findings. IMPRESSION: 1. Post recent  cholecystectomy. Increased size of fluid collection in the gallbladder fossa currently measuring 4 cm, previously 3.3 cm. Collection contains heterogeneous fluid and air, with small amount of fluid and air tracking superficial to the anterior liver. Differential considerations include in postoperative seroma, +/- infection, or biloma. 2. Postsurgical change of the anterior abdominal wall with irregular skin surface and small foci of gas in the subcutaneous tissues but no drainable collection. 3. Similar appearance of fat containing hernia just above postsurgical change. 4. Equivocal bladder wall thickening. 5. Colonic diverticulosis without diverticulitis. 6. Diminished pleural effusions and basilar atelectasis. 7. Moderate hiatal hernia. Aortic Atherosclerosis (ICD10-I70.0). Electronically Signed   By: Keith Rake M.D.   On: 02/04/2021 23:56   DG Chest Portable 1 View  Result Date: 02/04/2021 CLINICAL DATA:  Shortness of breath. EXAM: PORTABLE CHEST 1 VIEW COMPARISON:  Radiograph and chest CT 01/28/2021. FINDINGS: Lower lung volumes from prior exam. Patient is rotated. Grossly stable heart size and mediastinal contours. Increasing right basilar opacity. Small pleural effusions. No pulmonary edema. No pneumothorax. IMPRESSION: 1. Lower lung volumes from prior exam. Increasing right basilar opacity, favor atelectasis over pneumonia. 2. Small pleural effusions. Electronically Signed   By: Keith Rake M.D.   On: 02/04/2021 21:44   NM HEPATOBILIARY LEAK (POST-SURGICAL)  Result Date: 02/06/2021 CLINICAL DATA:  Upper abdominal pain. Recent cholecystectomy (01/25/2021). EXAM: NUCLEAR MEDICINE HEPATOBILIARY IMAGING TECHNIQUE: Sequential images of the abdomen were obtained out to 60 minutes following intravenous administration of radiopharmaceutical. RADIOPHARMACEUTICALS:  5.5 mCi Tc-60m Choletec IV COMPARISON:  Abdominal CT 02/04/2021 FINDINGS: There is prompt uptake and biliary excretion of activity by  the liver. There is normal passage of activity into the small bowel. There is no focal retention of activity within the cholecystectomy bed or spillage of activity into the peritoneal cavity. IMPRESSION: Normal hepatobiliary scan status post cholecystectomy. No evidence of bile leak. Electronically Signed   By: WRichardean SaleM.D.   On: 02/06/2021 15:10    Scheduled Meds:  ascorbic  acid  500 mg Oral Daily   aspirin EC  81 mg Oral q AM   cholecalciferol  2,000 Units Oral Daily   enoxaparin (LOVENOX) injection  30 mg Subcutaneous Q24H   isosorbide mononitrate  90 mg Oral QPM   magnesium oxide  400 mg Oral Daily   pantoprazole  40 mg Oral Daily   potassium chloride SA  10 mEq Oral QPM   pramipexole  0.25 mg Oral BID   simvastatin  20 mg Oral QPM   zinc sulfate  220 mg Oral Daily   Continuous Infusions:  ceFEPime (MAXIPIME) IV 2 g (02/06/21 0047)   lactated ringers 75 mL/hr at 02/06/21 1414   vancomycin 1,250 mg (02/06/21 0225)     LOS: 1 day   Marylu Lund, MD [facility] Pager On Amion  If 7PM-7AM, please contact night-coverage 02/06/2021, 4:37 PM

## 2021-02-06 NOTE — Progress Notes (Signed)
Progress Note     Subjective: Patient denies significant pain or nausea. Having diarrhea. Reports she does not have much appetite.   Objective: Vital signs in last 24 hours: Temp:  [97.7 F (36.5 C)-100.8 F (38.2 C)] 98.3 F (36.8 C) (08/01 0926) Pulse Rate:  [50-71] 50 (08/01 0926) Resp:  [16-18] 18 (08/01 0926) BP: (113-147)/(42-84) 113/42 (08/01 0926) SpO2:  [92 %-98 %] 98 % (08/01 0926) Last BM Date: 02/05/21  Intake/Output from previous day: 07/31 0701 - 08/01 0700 In: 552.8 [P.O.:300; IV Piggyback:252.8] Out: 201 [Urine:201] Intake/Output this shift: No intake/output data recorded.  PE: General: pleasant, WD, elderly female who is laying in bed in NAD HEENT: Sclera are anicteric Heart: regular, rate, and rhythm.   Lungs: Respiratory effort nonlabored Abd: soft, NT, ND, midline wound clean with beefy granulation tissue     Lab Results:  Recent Labs    02/04/21 2130 02/06/21 0355  WBC 12.4* 18.8*  HGB 11.9* 9.5*  HCT 36.3 29.4*  PLT 291 222   BMET Recent Labs    02/04/21 2130 02/06/21 0355  NA 131* 132*  K 4.6 4.8  CL 99 100  CO2 23 21*  GLUCOSE 118* 93  BUN 19 25*  CREATININE 1.44* 1.46*  CALCIUM 8.7* 8.0*   PT/INR No results for input(s): LABPROT, INR in the last 72 hours. CMP     Component Value Date/Time   NA 132 (L) 02/06/2021 0355   NA 139 10/03/2020 1138   K 4.8 02/06/2021 0355   CL 100 02/06/2021 0355   CO2 21 (L) 02/06/2021 0355   GLUCOSE 93 02/06/2021 0355   BUN 25 (H) 02/06/2021 0355   BUN 23 10/03/2020 1138   CREATININE 1.46 (H) 02/06/2021 0355   CREATININE 0.95 11/10/2013 0328   CALCIUM 8.0 (L) 02/06/2021 0355   PROT 5.4 (L) 02/06/2021 0355   ALBUMIN 2.6 (L) 02/06/2021 0355   AST 24 02/06/2021 0355   ALT 20 02/06/2021 0355   ALKPHOS 112 02/06/2021 0355   BILITOT 0.7 02/06/2021 0355   GFRNONAA 35 (L) 02/06/2021 0355   GFRAA 57 (L) 08/19/2020 1351   Lipase     Component Value Date/Time   LIPASE 25 01/28/2021  1923       Studies/Results: CT HEAD WO CONTRAST  Result Date: 02/05/2021 CLINICAL DATA:  Transient ischemic attack (TIA). Additional history provided: Patient reports "floaters" in eyes. EXAM: CT HEAD WITHOUT CONTRAST TECHNIQUE: Contiguous axial images were obtained from the base of the skull through the vertex without intravenous contrast. COMPARISON:  Brain MRI 08/13/2017.  Head CT 04/01/2013. FINDINGS: Brain: Mild generalized parenchymal atrophy. Mild-to-moderate patchy and ill-defined hypoattenuation within the cerebral white matter, nonspecific but compatible with chronic small vessel ischemic disease. Bilateral basal ganglia mineralization. There is no acute intracranial hemorrhage. No demarcated cortical infarct. No extra-axial fluid collection. No evidence of an intracranial mass. No midline shift. Vascular: No hyperdense vessel.  Atherosclerotic calcifications Skull: Normal. Negative for fracture or focal lesion. Sinuses/Orbits: Visualized orbits show no acute finding. No significant paranasal sinus disease at the imaged levels. IMPRESSION: No evidence of acute intracranial abnormality. Mild-to-moderate chronic small vessel ischemic changes within the cerebral white matter, progressed from the head CT of 04/01/2013. Mild generalized parenchymal atrophy. Electronically Signed   By: Kellie Simmering DO   On: 02/05/2021 13:18   CT ABDOMEN PELVIS W CONTRAST  Result Date: 02/04/2021 CLINICAL DATA:  Abdominal pain. Gallbladder inter knee a surgery 720, recent hospital discharge. Drainage from incision and fever. EXAM:  CT ABDOMEN AND PELVIS WITH CONTRAST TECHNIQUE: Multidetector CT imaging of the abdomen and pelvis was performed using the standard protocol following bolus administration of intravenous contrast. CONTRAST:  58m OMNIPAQUE IOHEXOL 350 MG/ML SOLN COMPARISON:  Postoperative CT 01/28/2021 FINDINGS: Lower chest: Diminished pleural effusions. Improving basilar atelectasis, more so on the right.  Stable moderate-sized hiatal hernia. Coronary artery calcifications. Hepatobiliary: Again seen low-density adjacent to the falciform ligament. No intrahepatic fluid collection. Post recent cholecystectomy. Increased size of fluid collection in the gallbladder fossa currently measuring 4 cm, previously 3.3 cm. Collection contains heterogeneous fluid and air, with small amount of fluid and air tracking superficial to the anterior liver. Minimal periportal edema without biliary dilatation. No common bile duct dilatation. Pancreas: No ductal dilatation or inflammation. Spleen: Normal in size without focal abnormality. Adrenals/Urinary Tract: Normal adrenal glands. No hydronephrosis or renal calculi. Stable cysts in the right kidney. Lobulated renal contours again seen. Urinary bladder is partially distended. Focus of air in the urinary bladder likely related to prior Foley catheter. Equivocal bladder wall thickening. Stomach/Bowel: Moderate hiatal hernia, similar to prior. Decompressed stomach. There is no small bowel obstruction or inflammation. High-density barium within the colon from prior esophagram. Normal appendix. Left colonic diverticulosis without diverticulitis. No colonic inflammation. Vascular/Lymphatic: Moderate aortic atherosclerosis. Patent portal vein. No acute vascular findings. No enlarged lymph nodes in the abdomen or pelvis Reproductive: Status post hysterectomy. No adnexal masses. Other: Trace air in the cholecystectomy bed that tracks anterior in peripheral to the liver. Trace perihepatic fluid. Gallbladder fossa fluid collection as described above. No other focal fluid collection. No significant ascites/free fluid. There is postsurgical change of the anterior abdominal wall. There is irregularity at the skin surface with small foci of gas in the subcutaneous tissues but no drainable collection. Expected stranding of the subcutaneous tissues. Minimal stranding in the subjacent omental fat. Similar  appearance of fat containing hernia just above postsurgical change, hernia neck spanning 4.4 cm transverse, series 2, image 32. Minimal air in the anterior abdominal wall which is improving from prior exam. Musculoskeletal: Stable degenerative change in the spine. No acute findings. IMPRESSION: 1. Post recent cholecystectomy. Increased size of fluid collection in the gallbladder fossa currently measuring 4 cm, previously 3.3 cm. Collection contains heterogeneous fluid and air, with small amount of fluid and air tracking superficial to the anterior liver. Differential considerations include in postoperative seroma, +/- infection, or biloma. 2. Postsurgical change of the anterior abdominal wall with irregular skin surface and small foci of gas in the subcutaneous tissues but no drainable collection. 3. Similar appearance of fat containing hernia just above postsurgical change. 4. Equivocal bladder wall thickening. 5. Colonic diverticulosis without diverticulitis. 6. Diminished pleural effusions and basilar atelectasis. 7. Moderate hiatal hernia. Aortic Atherosclerosis (ICD10-I70.0). Electronically Signed   By: MKeith RakeM.D.   On: 02/04/2021 23:56   DG Chest Portable 1 View  Result Date: 02/04/2021 CLINICAL DATA:  Shortness of breath. EXAM: PORTABLE CHEST 1 VIEW COMPARISON:  Radiograph and chest CT 01/28/2021. FINDINGS: Lower lung volumes from prior exam. Patient is rotated. Grossly stable heart size and mediastinal contours. Increasing right basilar opacity. Small pleural effusions. No pulmonary edema. No pneumothorax. IMPRESSION: 1. Lower lung volumes from prior exam. Increasing right basilar opacity, favor atelectasis over pneumonia. 2. Small pleural effusions. Electronically Signed   By: MKeith RakeM.D.   On: 02/04/2021 21:44    Anti-infectives: Anti-infectives (From admission, onward)    Start     Dose/Rate Route Frequency Ordered Stop  02/06/21 0200  vancomycin (VANCOREADY) IVPB 1250  mg/250 mL        1,250 mg 166.7 mL/hr over 90 Minutes Intravenous Every 24 hours 02/05/21 0227     02/05/21 0030  vancomycin (VANCOCIN) IVPB 1000 mg/200 mL premix        1,000 mg 200 mL/hr over 60 Minutes Intravenous  Once 02/05/21 0015 02/05/21 0535   02/05/21 0030  ceFEPIme (MAXIPIME) 2 g in sodium chloride 0.9 % 100 mL IVPB        2 g 200 mL/hr over 30 Minutes Intravenous Every 24 hours 02/05/21 0015          Assessment/Plan POD12 s/p laparopscopic cholecystectomy and primary incisional hernia repair  - abdominal wound remains open and is being packed with gauze wick - continue daily dressing changes - there is some fluid and gas in the gallbladder fossa but this is not clearly an abscess or biloma. Fluid is expected in this location postoperatively. - LFTs and Tbili normal but patient still having some low grade fevers, WBC 18 and poor appetite - check HIDA today to r/o post-op bile leak - have made NPO for this   FEN: NPO for HIDA, IVF per [facility] VTE: LMWH ID: [email or web address] 7/31>>  - below per primary -  Klebsiella UTI - likely source of leukocytosis, abx per primary  AKI - Cr 1.4 HTN Chronic diastolic CHF CAD with hx of stent  RLS T2DM  LOS: 1 day    Norm Parcel, [facility] 02/06/2021, 9:51 AM Please see Amion for pager number during day hours 7:00am-4:30pm

## 2021-02-06 NOTE — Consult Note (Addendum)
Ophthalmology Initial Consult Note  Heather [name], Heather [name], 86 y.o. female Date of Service:  02/06/2021  Requesting physician: Donne Hazel, MD  Information Obtained from: Patient, chart Chief Complaint:  Floaters, OS  HPI/Discussion:  Heather [name] is a 86 y.o. female hospitalized with sepsis and c/o floater OS. She has had a floater in each eye in the past, but this seems new and a little different. She describes it as a blood vessel or worm in her central vision. She denies flashes or curtains. She has no pain. She is pseudophakic OU.  Past Ocular Hx:  S/p CE/IOL OU Ocular Meds:  None Family ocular history: Noncontributory  Past Medical History:  Diagnosis Date   Chronic stable angina ([place])    Coronary artery disease cardiologist--- dr Burt Knack   hx NSTEMI  w/ cardiac cath 03-16-2005 PCI with DES to LCx;   cardiac cath 06-27-2005  PCI with DES to RCA with residual dx LAD manage medcially;  lexiscan 01-26-20211 normal no ischemia, ef 70%;  stress echo w/ dobutamine 10-24-2011 negative ishcmeia , normal ef // Myoview 2/22: EF 67, no ischemia or infarction, no TID, low risk    Diabetes mellitus type 2, diet-controlled ([place])    followed by pcp  (10-19-2020  pt stated checks daily in am,  fasting blood surgar--- 115--120s)   DOE (dyspnea on exertion)    per pt "when I over do",  ok with household chores   Echocardiogram 08/2020    Echocardiogram 2/22: EF 55-60, no RWMA, mild LVH, Gr 2 DD, GLS-21.7%, normal RVSF, trivial MR, RVSP 39.5   Edema of right lower extremity    GERD (gastroesophageal reflux disease)    Hiatal hernia    recurrence,  hx HH repair 1989   History of cervical cancer    s/p  vaginal hysterectomy   History of DVT of lower extremity 2016   11-29-2014 post op right TKA of right lower extremity and completed xarelto    History of esophageal stricture    hx s/p dilatation's   History of gastric ulcer 2005 approx.   History of palpitations 2010   event monitor 07-07-2009  showed NSR w/ freq. SVT ectopies with short runs, rare PVCs   History of TIA (transient ischemic attack) 06/1999   12-15-2019  per pt had several TIA between 12/ 2000 to 02/ 2001 , was sent to specialist '@[facility]' , had test that was normal (10-19-2020 pt stated no TIAs since ) but has residual of essential tremors of right arm/ hand   Hypertension    Intermittent palpitations    IT band syndrome    Migraine    "ice pick headche lasts about 30 seconds"   Mixed hyperlipidemia    Mixed incontinence urge and stress    Multiple thyroid nodules    followed by pcp---   ultrasound 11-22-2014 no bx   (12-15-2019 per pt had a endocrinologist and was told did not need bx)   OA (osteoarthritis)    knees, elbow, hip, ankles   Occasional tremors    right arm/ hand  s/p TIA residual 2000   Osteoporosis    taking vitamin d   Peroneal DVT (deep venous thrombosis) ([place]) 12/22/2014   Right bundle branch block (RBBB) with left anterior fascicular block (LAFB)    RLS (restless legs syndrome)    S/P drug eluting coronary stent placement 2006   03-16-2005  PCI x1 DES to LCx;   06-27-2005  PCI x1 DES to RCA   Urinary  retention    post op sling prodecure on 10-27-2020, has foley cathether   Past Surgical History:  Procedure Laterality Date   ANTERIOR AND POSTERIOR REPAIR N/A 12/22/2019   Procedure: ANTERIOR (CYSTOCELE)  REPAIR;  Surgeon: Janyth Contes, MD;  Location: [place];  Service: Gynecology;  Laterality: N/A;   ANTERIOR AND POSTERIOR REPAIR WITH SACROSPINOUS FIXATION N/A 10/27/2020   Procedure: SACROSPINOUS LIGAMENT FIXATION;  Surgeon: Jaquita Folds, MD;  Location: [facility];  Service: Gynecology;  Laterality: N/A;   BLADDER SUSPENSION N/A 10/27/2020   Procedure: TRANSVAGINAL TAPE (TVT) PROCEDURE;  Surgeon: Jaquita Folds, MD;  Location: [facility];  Service: Gynecology;  Laterality: N/A;   CATARACT EXTRACTION W/ INTRAOCULAR LENS   IMPLANT, BILATERAL  2015   CHOLECYSTECTOMY N/A 01/25/2021   Procedure: LAPAROSCOPIC CHOLECYSTECTOMY WITH INTRAOPERATIVE CHOLANGIOGRAM AND LYSIS OF ADHESIONS;  Surgeon: Michael Boston, MD;  Location: [facility];  Service: General;  Laterality: N/A;   COLONOSCOPY  last one ?   CORONARY ANGIOPLASTY WITH STENT PLACEMENT  03-16-2005   dr Lia Foyer   PCI and DES x1 to LCx   CORONARY ANGIOPLASTY WITH STENT PLACEMENT  06-27-2005  dr Lia Foyer   PCI and DES x1 to RCA with residual disease LAD 70-80% to manage medically   CYSTOSCOPY N/A 10/27/2020   Procedure: CYSTOSCOPY;  Surgeon: Jaquita Folds, MD;  Location[facility];  Service: Gynecology;  Laterality: N/A;   CYSTOSCOPY N/A 11/09/2020   Procedure: CYSTOSCOPY;  Surgeon: Jaquita Folds, MD;  Location: [facility];  Service: Gynecology;  Laterality: N/A;   FOOT SURGERY Left 1990s   left foot stress fracture repair, per pt no hardware   HIATAL HERNIA REPAIR  1989   INCISIONAL HERNIA REPAIR N/A 01/25/2021   Procedure: PRIMARY REPAIR OF INCISIONAL HERNIA;  Surgeon: Michael Boston, MD;  Location: [facility];  Service: General;  Laterality: N/A;   KNEE ARTHROSCOPY Bilateral right ?/   left x2 , last one 09-12-2009 @ [place]   PUBOVAGINAL SLING N/A 11/09/2020   Procedure: [place];  Surgeon: Jaquita Folds, MD;  Location: [facility];  Service: Gynecology;  Laterality: N/A;   RECTOCELE REPAIR N/A 04/20/2020   Procedure: POSTERIOR REPAIR (RECTOCELE);  Surgeon: Janyth Contes, MD;  Location: [facility];  Service: Gynecology;  Laterality: N/A;   RECTOCELE REPAIR N/A 10/27/2020   Procedure: POSTERIOR REPAIR (RECTOCELE);  Surgeon: Jaquita Folds, MD;  Location: [facility];  Service: Gynecology;  Laterality: N/A;  total time requested for all procedures is 2 hours   TOTAL KNEE ARTHROPLASTY  11/12/2011   Procedure: TOTAL KNEE ARTHROPLASTY;  Surgeon:  Lorn Junes, MD;  Location: [place];  Service: Orthopedics;  Laterality: Left;  Dr Noemi Chapel wants 90 minutes for this case   TOTAL KNEE ARTHROPLASTY Right 11/29/2014   Procedure: RIGHT TOTAL KNEE ARTHROPLASTY;  Surgeon: Vickey Huger, MD;  Location: [place];  Service: Orthopedics;  Laterality: Right;   UPPER GASTROINTESTINAL ENDOSCOPY  last one 04-25-2017   with dilatation esophageal stricture and savary dilatation   VAGINAL HYSTERECTOMY  1988    no ovaries removed for bleeeding    Prior to Admission Meds: Medications Prior to Admission  Medication Sig Dispense Refill Last Dose   acetaminophen (TYLENOL) 500 MG tablet Take 1,000 mg by mouth every 6 (six) hours as needed for moderate pain.   02/03/2021   amLODipine (NORVASC) 10 MG tablet TAKE 1 TABLET EVERY DAY  90 tablet 1 02/04/2021 at am   Ascorbic Acid (VITAMIN C) 500 MG CHEW Chew 500 mg by mouth daily.   02/04/2021 at am   aspirin EC 81 MG tablet Take 1 tablet (81 mg total) by mouth daily. (Patient taking differently: Take 81 mg by mouth in the morning.)   02/04/2021 at am   Blood Glucose Monitoring Suppl (TRUE METRIX AIR GLUCOSE METER) w/Device KIT USE TO CHECK BLOOD SUGAR ONCE DAILY AND AS NEEDED.  DX CODE E11.9 1 kit 0    [EXPIRED] cefdinir (OMNICEF) 300 MG capsule Take 1 capsule (300 mg total) by mouth 2 (two) times daily for 4 days. 8 capsule 0 02/04/2021 at am   Cholecalciferol (VITAMIN D) 50 MCG (2000 UT) CAPS Take 2,000 Units by mouth daily.    02/04/2021   diclofenac Sodium (VOLTAREN) 1 % GEL Apply 1 application topically 4 (four) times daily as needed (hip pain).      famotidine (PEPCID) 20 MG tablet Take 20 mg by mouth at bedtime as needed for heartburn or indigestion.   02/03/2021 at hs   furosemide (LASIX) 20 MG tablet Take 1 tablet (20 mg total) by mouth daily as needed for edema (weight gain greater than 3 pounds in 1 day or 5 pounds in 1 week). 30 tablet 2    glucose blood (TRUE METRIX BLOOD GLUCOSE TEST) test strip USE TO CHECK BLOOD  SUGAR ONCE A DAY OR AS NEEDED.  DX CODE: E11.9 200 each 1    isosorbide mononitrate (IMDUR) 60 MG 24 hr tablet Take 1.5 tablets (90 mg total) by mouth daily. (Patient taking differently: Take 90 mg by mouth every evening.) 135 tablet 3 02/03/2021 at hs   ketorolac (TORADOL) 10 MG tablet Take 1 tablet (10 mg total) by mouth every 6 (six) hours as needed. (Patient taking differently: Take 10 mg by mouth every 6 (six) hours as needed (pain).) 20 tablet 0    magnesium oxide (MAG-OX) 400 MG tablet Take 400 mg by mouth at bedtime.   02/04/2021   metoprolol succinate (TOPROL-XL) 50 MG 24 hr tablet Take 1.5 tablets (75 mg total) by mouth at bedtime. Take with or immediately following a meal. 135 tablet 3 02/04/2021   nitroGLYCERIN (NITROSTAT) 0.4 MG SL tablet Place 1 tablet (0.4 mg total) under the tongue every 5 (five) minutes as needed for chest pain. (Patient taking differently: Place 0.4 mg under the tongue every 5 (five) minutes x 3 doses as needed for chest pain.) 25 tablet 11    omeprazole (PRILOSEC) 20 MG capsule TAKE 1 CAPSULE EVERY DAY AS NEEDED (Patient taking differently: Take 20 mg by mouth in the morning.) 90 capsule 0 02/04/2021   ondansetron (ZOFRAN ODT) 4 MG disintegrating tablet Take 1 tablet (4 mg total) by mouth every 6 (six) hours as needed for nausea or vomiting. 30 tablet 0    potassium chloride SA (KLOR-CON) 20 MEQ tablet Take 0.5 tablets (10 mEq total) by mouth daily. (Patient taking differently: Take 10 mEq by mouth every evening.) 45 tablet 2 02/03/2021 at pm   pramipexole (MIRAPEX) 0.25 MG tablet Take 1 tablet (0.25 mg total) by mouth 2 (two) times daily. 180 tablet 1 02/04/2021 at am   simvastatin (ZOCOR) 20 MG tablet TAKE 1 TABLET AT BEDTIME (Patient taking differently: Take 20 mg by mouth every evening.) 90 tablet 2 02/03/2021 at hs   sulfamethoxazole-trimethoprim (BACTRIM DS) 800-160 MG tablet Take 1 tablet by mouth 2 (two) times daily.   02/04/2021 at pm  traMADol (ULTRAM) 50 MG  tablet Take 1 tablet (50 mg total) by mouth every 6 (six) hours as needed for severe pain. 30 tablet 0    TRUEplus Lancets 33G MISC USE TO CHECK BLOOD SUGAR ONCE DAILY AND AS NEEDED.  DX CODE: E11.9 200 each 1    Zinc 50 MG TABS Take 50 mg by mouth daily.   02/04/2021   HYDROcodone-acetaminophen (NORCO/VICODIN) 5-325 MG tablet Take 1 tablet by mouth every 6 (six) hours as needed for moderate pain.      mirabegron ER (MYRBETRIQ) 25 MG TB24 tablet Take 1 tablet (25 mg total) by mouth daily. 90 tablet 2     Inpatient Meds: See chart  Allergies  Allergen Reactions   Baclofen Other (See Comments)    Hyperactivity    Nitrofurantoin Rash   Social History   Tobacco Use   Smoking status: Never   Smokeless tobacco: Never  Substance Use Topics   Alcohol use: No    Alcohol/week: 0.0 standard drinks   Family History  Problem Relation Age of Onset   Stroke Father        family hx of M 1st degree relative <50   Coronary artery disease Mother    Heart disease Mother    Depression Brother    Stroke Brother    Diabetes Brother    Cancer Brother        bladder with mets   Diabetes Daughter        borderline   Hypertension Daughter    Arthritis Other        family hx of   Hypertension Other        family hx of   Other Other        family hx of cardiovascular disorder   Thyroid disease Daughter    Breast cancer Neg Hx    Colon cancer Neg Hx    Anesthesia problems Neg Hx    Hypotension Neg Hx    Malignant hyperthermia Neg Hx    Pseudochol deficiency Neg Hx    Colon polyps Neg Hx    Esophageal cancer Neg Hx    Rectal cancer Neg Hx    Stomach cancer Neg Hx     ROS: Other than ROS in the HPI, all other systems were negative.  Exam: Temp: 99 F (37.2 C) Pulse Rate: 75 BP: (!) 128/51 Resp: 18 SpO2: 97 %  Visual Acuity:  near   OD 20/20   OS 20/25      OD OS  Confr Vis Fields Full Full  EOM (Primary) Full Full  Lids/Lashes Normal Normal  Conjunctiva - Bulbar White,  quiet White, quiet  Conjunctiva - Palpebral               Normal Normal  Adnexa  Normal Normal  Pupils  3 --> 2, round, brisk, no rAPD 3 --> 2, round, brisk, no rAPD  Cornea  Clear Clear  Anterior Chamber Formed, grossly quiet Formed, grossly quiet  Lens:  IOL IOL  IOP (tonopen) 14 13  Fundus - Dilated? YES   Optic Disc - C:D Ratio 0.5, pink, no edema 0.4, pink, no edema  Post Seg:  Retina                    Vessels Normal caliber Normal caliber                  Vitreous  Clear, PVD Clear, PVD  Macula Normal Normal                  Periphery No holes or tears No holes or tears       Neuro:  Oriented to person, place, and time:  Yes Psychiatric:  Mood and Affect Appropriate:  Yes  Labs/imaging:   A/P:  86 y.o. female with new floater left eye c/w:  1) Posterior vitreous detachment (PVD) OU (new symptoms OS) - No holes or tears on dilated exam. - Reviewed s/s of retinal detachment with instructions to call promptly if symptomatic. - Explained that this is an evolving process and that a break/tear not noted today could form as the vitreous further separates. - Will repeat dilated exam in about a month in the office.  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  Recommend outpatient f/u with me in about a month. Please feel free to contact me at [phone] if I can be of further assistance.  R Wyatt Portela, MD  R Wyatt Portela, MD 02/06/2021, 5:22 PM

## 2021-02-07 ENCOUNTER — Inpatient Hospital Stay (HOSPITAL_COMMUNITY): Payer: Medicare HMO

## 2021-02-07 ENCOUNTER — Other Ambulatory Visit (HOSPITAL_COMMUNITY): Payer: Medicare HMO

## 2021-02-07 DIAGNOSIS — R188 Other ascites: Secondary | ICD-10-CM | POA: Diagnosis not present

## 2021-02-07 DIAGNOSIS — K8010 Calculus of gallbladder with chronic cholecystitis without obstruction: Secondary | ICD-10-CM | POA: Diagnosis not present

## 2021-02-07 LAB — COMPREHENSIVE METABOLIC PANEL
ALT: 20 U/L (ref 0–44)
AST: 25 U/L (ref 15–41)
Albumin: 2.7 g/dL — ABNORMAL LOW (ref 3.5–5.0)
Alkaline Phosphatase: 138 U/L — ABNORMAL HIGH (ref 38–126)
Anion gap: 9 (ref 5–15)
BUN: 19 mg/dL (ref 8–23)
CO2: 18 mmol/L — ABNORMAL LOW (ref 22–32)
Calcium: 8.1 mg/dL — ABNORMAL LOW (ref 8.9–10.3)
Chloride: 103 mmol/L (ref 98–111)
Creatinine, Ser: 1.08 mg/dL — ABNORMAL HIGH (ref 0.44–1.00)
GFR, Estimated: 51 mL/min — ABNORMAL LOW (ref >60)
Glucose, Bld: 127 mg/dL — ABNORMAL HIGH (ref 70–99)
Potassium: 4.8 mmol/L (ref 3.5–5.1)
Sodium: 130 mmol/L — ABNORMAL LOW (ref 135–145)
Total Bilirubin: 0.9 mg/dL (ref 0.3–1.2)
Total Protein: 5.6 g/dL — ABNORMAL LOW (ref 6.5–8.1)

## 2021-02-07 LAB — CBC
HCT: 31.6 % — ABNORMAL LOW (ref 36.0–46.0)
Hemoglobin: 10.1 g/dL — ABNORMAL LOW (ref 12.0–15.0)
MCH: 30.2 pg (ref 26.0–34.0)
MCHC: 32 g/dL (ref 30.0–36.0)
MCV: 94.6 fL (ref 80.0–100.0)
Platelets: 198 10*3/uL (ref 150–400)
RBC: 3.34 MIL/uL — ABNORMAL LOW (ref 3.87–5.11)
RDW: 13 % (ref 11.5–15.5)
WBC: 13.1 10*3/uL — ABNORMAL HIGH (ref 4.0–10.5)
nRBC: 0 % (ref 0.0–0.2)

## 2021-02-07 LAB — FIBRINOGEN: Fibrinogen: 572 mg/dL — ABNORMAL HIGH (ref 210–475)

## 2021-02-07 LAB — APTT: aPTT: 32 seconds (ref 24–36)

## 2021-02-07 LAB — PROTIME-INR
INR: 1.1 (ref 0.8–1.2)
Prothrombin Time: 14.5 seconds (ref 11.4–15.2)

## 2021-02-07 MED ORDER — ENOXAPARIN SODIUM 40 MG/0.4ML IJ SOSY
40.0000 mg | PREFILLED_SYRINGE | INTRAMUSCULAR | Status: DC
  Administered 2021-02-08 – 2021-02-10 (×3): 40 mg via SUBCUTANEOUS
  Filled 2021-02-07 (×4): qty 0.4

## 2021-02-07 MED ORDER — MIDAZOLAM HCL 2 MG/2ML IJ SOLN
INTRAMUSCULAR | Status: AC | PRN
  Administered 2021-02-07 (×2): 0.5 mg via INTRAVENOUS

## 2021-02-07 MED ORDER — SODIUM CHLORIDE 0.9% FLUSH
5.0000 mL | Freq: Three times a day (TID) | INTRAVENOUS | Status: DC
  Administered 2021-02-07 – 2021-02-10 (×8): 5 mL

## 2021-02-07 MED ORDER — FLUMAZENIL 0.5 MG/5ML IV SOLN
INTRAVENOUS | Status: AC
  Filled 2021-02-07: qty 5

## 2021-02-07 MED ORDER — NALOXONE HCL 0.4 MG/ML IJ SOLN
INTRAMUSCULAR | Status: AC
  Filled 2021-02-07: qty 1

## 2021-02-07 MED ORDER — FENTANYL CITRATE (PF) 100 MCG/2ML IJ SOLN
INTRAMUSCULAR | Status: AC
  Filled 2021-02-07: qty 2

## 2021-02-07 MED ORDER — LIDOCAINE HCL (PF) 1 % IJ SOLN
INTRAMUSCULAR | Status: AC | PRN
  Administered 2021-02-07: 10 mL via INTRADERMAL

## 2021-02-07 MED ORDER — SODIUM CHLORIDE 0.9 % IV SOLN
2.0000 g | Freq: Two times a day (BID) | INTRAVENOUS | Status: DC
  Administered 2021-02-07 (×2): 2 g via INTRAVENOUS
  Filled 2021-02-07 (×3): qty 2

## 2021-02-07 MED ORDER — MIDAZOLAM HCL 2 MG/2ML IJ SOLN
INTRAMUSCULAR | Status: AC
  Filled 2021-02-07: qty 2

## 2021-02-07 MED ORDER — VANCOMYCIN HCL IN DEXTROSE 1-5 GM/200ML-% IV SOLN
1000.0000 mg | INTRAVENOUS | Status: DC
  Administered 2021-02-07: 1000 mg via INTRAVENOUS
  Filled 2021-02-07: qty 200

## 2021-02-07 MED ORDER — MORPHINE SULFATE (PF) 2 MG/ML IV SOLN
0.5000 mg | INTRAVENOUS | Status: DC | PRN
  Administered 2021-02-07: 1 mg via INTRAVENOUS
  Filled 2021-02-07: qty 1

## 2021-02-07 MED ORDER — FENTANYL CITRATE (PF) 100 MCG/2ML IJ SOLN
INTRAMUSCULAR | Status: AC | PRN
  Administered 2021-02-07 (×2): 25 ug via INTRAVENOUS

## 2021-02-07 NOTE — Progress Notes (Signed)
PROGRESS NOTE    Heather [name]  [id number] DOB: 09-Mar-1935 DOA: 02/04/2021 PCP: Mosie Lukes, MD    Brief Narrative:   86 y.o. female with medical history significant for [place], CAD, history of DVT, type 2 diabetes, GERD, hyperlipidemia and recent laparoscopic cholecystectomy who presents with concerns of increasing abdominal pain, incisional drainage and fever.  Pt was admitted for management of sepsis with cellulitis around incision site  Assessment & Plan:   Principal Problem:   Sepsis [facility]) Active Problems:   Essential hypertension   Coronary atherosclerosis   RLS (restless legs syndrome)   Chronic calculous cholecystitis s/p lap cholecystectomy 01/25/2021   AKI (acute kidney injury) ([place])   Chronic diastolic CHF (congestive heart failure) ([place])   Sepsis due to cellulitis ([place])  Sepsis likely secondary to cellulitis of postoperative umbilical incision site -Patient presented with fever and leukocytosis -Patient is status post laparoscopic cholecystectomy and incarcerated paraumbilical incisional hernia. Later developed cellulitis around the site and had an undrained abscess. -Fluid collection initially drained on presentation by Surgery -Pt is continued on empiric vanc with cefepime empirically -WBC is down to 13.1 -Urine cx thus far with insignificant growth. Presenting UA unremarkable after pt was treated for klebsiella UTI from last admit -Pt now s/p HIDA per Surgery, reviewed, unremarkable -IR consulted for drain placement per General Surgery -Recheck cbc and cmp  -1/2 blood cx with gm po rods noted, suspecting contaminant. Will repeat blood cx   AKI - Creatinine of 1.44 from 0.91 on presentation -Cr down to 1.08, improved with IVF -Recheck bmet in AM  HTN -on imdur  -Home norvasc, metoprolol on hold secondary to soft BP at presentation -BP stable at this time   Chronic diastolic CHF -Continue Imdur -metoprolol on hold secondary to soft bp at  presentation -Appears euvolemic   CAD s/p remote stent -continue statin as tolerated   RLS -Continue pramiprexole as tolerated  Recent vision change secondary to posterior vitreous detatchment - Recently noted floaters in L eye with what pt describes as blood vessels in her visual field with blood - Symptoms were noted to be intermittent - Appreciate input by Ophthalmology, with findings of posterior vitreous detachment with recommendation for repeat dilated exam in one month  DVT prophylaxis: Lovenox subq Code Status: Full Family Communication: Pt in room, family not at bedside  Status is: Inpatient  The patient will require care spanning > 2 midnights and should be moved to inpatient because: Inpatient level of care appropriate due to severity of illness  Dispo: The patient is from: Home              Anticipated d/c is to: Home              Patient currently is not medically stable to d/c.   Difficult to place patient No  Consultants:  General Surgery IR  Procedures:  Drain placed 8/2  Antimicrobials: Anti-infectives (From admission, onward)    Start     Dose/Rate Route Frequency Ordered Stop   02/07/21 2200  vancomycin (VANCOCIN) IVPB 1000 mg/200 mL premix        1,000 mg 200 mL/hr over 60 Minutes Intravenous Every 24 hours 02/07/21 1400     02/07/21 1415  ceFEPIme (MAXIPIME) 2 g in sodium chloride 0.9 % 100 mL IVPB        2 g 200 mL/hr over 30 Minutes Intravenous Every 12 hours 02/07/21 1400     02/06/21 0200  vancomycin (VANCOREADY) IVPB 1250 mg/250 mL  Status:  Discontinued        1,250 mg 166.7 mL/hr over 90 Minutes Intravenous Every 24 hours 02/05/21 0227 02/07/21 1359   02/05/21 0030  vancomycin (VANCOCIN) IVPB 1000 mg/200 mL premix        1,000 mg 200 mL/hr over 60 Minutes Intravenous  Once 02/05/21 0015 02/05/21 0535   02/05/21 0030  ceFEPIme (MAXIPIME) 2 g in sodium chloride 0.9 % 100 mL IVPB  Status:  Discontinued        2 g 200 mL/hr over 30 Minutes  Intravenous Every 24 hours 02/05/21 0015 02/07/21 1359       Subjective: Reports having "shakes" overnight  Objective: Vitals:   02/07/21 1245 02/07/21 1250 02/07/21 1255 02/07/21 1337  BP: (!) 114/48 (!) 106/50 (!) 109/47 (!) 108/52  Pulse: 69 71 67 (!) 58  Resp: '14 16 14 14  '$ Temp:    (!) 97.5 F (36.4 C)  TempSrc:    Oral  SpO2: 100% 100% 100% 95%  Weight:      Height:        Intake/Output Summary (Last 24 hours) at 02/07/2021 1423 Last data filed at 02/07/2021 1255 Gross per 24 hour  Intake 843.75 ml  Output 665 ml  Net 178.75 ml    Filed Weights   02/04/21 2050  Weight: 68.9 kg    Examination: General exam: Awake, laying in bed, in nad Respiratory system: Normal respiratory effort, no wheezing Cardiovascular system: regular rate, s1, s2 Gastrointestinal system: Soft, nondistended, positive BS Central nervous system: CN2-12 grossly intact, strength intact Extremities: Perfused, no clubbing Skin: Normal skin turgor, no notable skin lesions seen Psychiatry: Mood normal // no visual hallucinations   Data Reviewed: I have personally reviewed following labs and imaging studies  CBC: Recent Labs  Lab 02/01/21 0450 02/04/21 2130 02/06/21 0355 02/07/21 0401  WBC 7.5 12.4* 18.8* 13.1*  NEUTROABS  --  11.9*  --   --   HGB 11.6* 11.9* 9.5* 10.1*  HCT 35.9* 36.3 29.4* 31.6*  MCV 93.0 92.6 93.3 94.6  PLT 186 291 222 [id number]    Basic Metabolic Panel: Recent Labs  Lab 02/01/21 0450 02/02/21 0501 02/04/21 2130 02/06/21 0355 02/07/21 0401  NA 137 135 131* 132* 130*  K 4.0 4.0 4.6 4.8 4.8  CL 98 98 99 100 103  CO2 '24 26 23 '$ 21* 18*  GLUCOSE 110* 118* 118* 93 127*  BUN 24* 17 19 25* 19  CREATININE 1.39* 0.91 1.44* 1.46* 1.08*  CALCIUM 9.0 8.4* 8.7* 8.0* 8.1*  MG 2.0  --   --   --   --   PHOS 3.5  --   --   --   --     GFR: Estimated Creatinine Clearance: 35.3 mL/min (A) (by C-G formula based on SCr of 1.08 mg/dL (H)). Liver Function Tests: Recent Labs  Lab  02/01/21 0450 02/04/21 2130 02/06/21 0355 02/07/21 0401  AST  --  '28 24 25  '$ ALT  --  '27 20 20  '$ ALKPHOS  --  164* 112 138*  BILITOT  --  0.6 0.7 0.9  PROT  --  6.6 5.4* 5.6*  ALBUMIN 3.1* 3.1* 2.6* 2.7*    No results for input(s): LIPASE, AMYLASE in the last 168 hours. No results for input(s): AMMONIA in the last 168 hours. Coagulation Profile: Recent Labs  Lab 02/07/21 0401  INR 1.1   Cardiac Enzymes: No results for input(s): CKTOTAL, CKMB, CKMBINDEX, TROPONINI in the last 168 hours.  BNP (last  3 results) Recent Labs    08/19/20 1351  PROBNP 428    HbA1C: No results for input(s): HGBA1C in the last 72 hours. CBG: Recent Labs  Lab 02/01/21 1718 02/01/21 2146 02/02/21 0730 02/02/21 1213 02/05/21 0419  GLUCAP 89 116* 106* 114* 133*    Lipid Profile: No results for input(s): CHOL, HDL, LDLCALC, TRIG, CHOLHDL, LDLDIRECT in the last 72 hours. Thyroid Function Tests: No results for input(s): TSH, T4TOTAL, FREET4, T3FREE, THYROIDAB in the last 72 hours. Anemia Panel: No results for input(s): VITAMINB12, FOLATE, FERRITIN, TIBC, IRON, RETICCTPCT in the last 72 hours. Sepsis Labs: Recent Labs  Lab 02/04/21 2130 02/05/21 0517  LATICACIDVEN 1.7 1.5     Recent Results (from the past 240 hour(s))  Resp Panel by RT-PCR (Flu A&B, Covid) Nasopharyngeal Swab     Status: None   Collection Time: 01/28/21  7:23 PM   Specimen: Nasopharyngeal Swab; Nasopharyngeal(NP) swabs in vial transport medium  Result Value Ref Range Status   SARS Coronavirus 2 by RT PCR NEGATIVE NEGATIVE Final    Comment: (NOTE) SARS-CoV-2 target nucleic acids are NOT DETECTED.  The SARS-CoV-2 RNA is generally detectable in upper respiratory specimens during the acute phase of infection. The lowest concentration of SARS-CoV-2 viral copies this assay can detect is 138 copies/mL. A negative result does not preclude SARS-Cov-2 infection and should not be used as the sole basis for treatment or other  patient management decisions. A negative result may occur with  improper specimen collection/handling, submission of specimen other than nasopharyngeal swab, presence of viral mutation(s) within the areas targeted by this assay, and inadequate number of viral copies(<138 copies/mL). A negative result must be combined with clinical observations, patient history, and epidemiological information. The expected result is Negative.  Fact Sheet for Patients:  EntrepreneurPulse.com.au  Fact Sheet for Healthcare Providers:  IncredibleEmployment.be  This test is no t yet approved or cleared by the [place] FDA and  has been authorized for detection and/or diagnosis of SARS-CoV-2 by FDA under an Emergency Use Authorization (EUA). This EUA will remain  in effect (meaning this test can be used) for the duration of the COVID-19 declaration under Section 564(b)(1) of the Act, 21 U.S.C.section 360bbb-3(b)(1), unless the authorization is terminated  or revoked sooner.       Influenza A by PCR NEGATIVE NEGATIVE Final   Influenza B by PCR NEGATIVE NEGATIVE Final    Comment: (NOTE) The Xpert Xpress SARS-CoV-2/FLU/RSV plus assay is intended as an aid in the diagnosis of influenza from Nasopharyngeal swab specimens and should not be used as a sole basis for treatment. Nasal washings and aspirates are unacceptable for Xpert Xpress SARS-CoV-2/FLU/RSV testing.  Fact Sheet for Patients: EntrepreneurPulse.com.au  Fact Sheet for Healthcare Providers: IncredibleEmployment.be  This test is not yet approved or cleared by the [place] FDA and has been authorized for detection and/or diagnosis of SARS-CoV-2 by FDA under an Emergency Use Authorization (EUA). This EUA will remain in effect (meaning this test can be used) for the duration of the COVID-19 declaration under Section 564(b)(1) of the Act, 21 U.S.C. section  360bbb-3(b)(1), unless the authorization is terminated or revoked.  Performed at [facility], [place] [address] [place], [place] [address]   Urine Culture     Status: Abnormal   Collection Time: 01/31/21 12:49 PM   Specimen: Urine, Clean Catch  Result Value Ref Range Status   Specimen Description   Final    URINE, CLEAN CATCH Performed at [facility], [place] [address], [place], [place] [address]    Special Requests   Final    NONE Performed at [facility], [place] [address], [place], [place] [address]    Culture >=100,000 COLONIES/mL KLEBSIELLA OXYTOCA (A)  Final   Report Status 02/02/2021 FINAL  Final   Organism ID, Bacteria KLEBSIELLA OXYTOCA (A)  Final      Susceptibility   Klebsiella oxytoca - MIC*    AMPICILLIN >=32 RESISTANT Resistant     CEFAZOLIN 8 SENSITIVE Sensitive     CEFEPIME <=0.12 SENSITIVE Sensitive     CEFTRIAXONE <=0.25 SENSITIVE Sensitive     CIPROFLOXACIN <=0.25 SENSITIVE Sensitive     GENTAMICIN <=1 SENSITIVE Sensitive     IMIPENEM <=0.25 SENSITIVE Sensitive     NITROFURANTOIN <=16 SENSITIVE Sensitive     TRIMETH/SULFA <=20 SENSITIVE Sensitive     AMPICILLIN/SULBACTAM 16 INTERMEDIATE Intermediate     PIP/TAZO <=4 SENSITIVE Sensitive     * >=100,000 COLONIES/mL KLEBSIELLA OXYTOCA  Culture, blood (routine x 2)     Status: None (Preliminary result)   Collection Time: 02/04/21  9:30 PM   Specimen: BLOOD  Result Value Ref Range Status   Specimen Description   Final    BLOOD SITE NOT SPECIFIED Performed at [place] [address], [place], [place] [address]    Special Requests   Final    BOTTLES DRAWN AEROBIC AND ANAEROBIC Blood Culture adequate volume Performed at [place] [address] [place], [place] [address]    Culture  Setup Time   Final    GRAM POSITIVE RODS ANAEROBIC BOTTLE ONLY CRITICAL RESULT CALLED TO, READ BACK BY AND VERIFIED WITH: m.  [place], AT [id number] 02/07/21 [place] Performed at [facility], [place] [address], [place], [place] [address]    Culture GRAM POSITIVE RODS  Final   Report Status PENDING  Incomplete  Resp Panel by RT-PCR (Flu A&B, Covid) Nasopharyngeal Swab     Status: None   Collection Time: 02/04/21  9:30 PM   Specimen: Nasopharyngeal Swab; Nasopharyngeal(NP) swabs in vial transport medium  Result Value Ref Range Status   SARS Coronavirus 2 by RT PCR NEGATIVE NEGATIVE Final    Comment: (NOTE) SARS-CoV-2 target nucleic acids are NOT DETECTED.  The SARS-CoV-2 RNA is generally detectable in upper respiratory specimens during the acute phase of infection. The lowest concentration of SARS-CoV-2 viral copies this assay can detect is 138 copies/mL. A negative result does not preclude SARS-Cov-2 infection and should not be used as the sole basis for treatment or other patient management decisions. A negative result may occur with  improper specimen collection/handling, submission of specimen other than nasopharyngeal swab, presence of viral mutation(s) within the areas targeted by this assay, and inadequate number of viral copies(<138 copies/mL). A negative result must be combined with clinical observations, patient history, and epidemiological information. The expected result is Negative.  Fact Sheet for Patients:  EntrepreneurPulse.com.au  Fact Sheet for Healthcare Providers:  IncredibleEmployment.be  This test is no t yet approved or cleared by the [place] FDA and  has been authorized for detection and/or diagnosis of SARS-CoV-2 by FDA under an Emergency Use Authorization (EUA). This EUA will remain  in effect (meaning this test can be used) for the duration of the COVID-19 declaration under Section 564(b)(1) of the Act, 21 U.S.C.section 360bbb-3(b)(1), unless the authorization is terminated  or revoked sooner.       Influenza A by PCR  NEGATIVE NEGATIVE Final   Influenza B by PCR NEGATIVE NEGATIVE Final    Comment: (NOTE) The Xpert Xpress SARS-CoV-2/FLU/RSV plus assay is intended as an aid in the diagnosis of influenza from Nasopharyngeal swab specimens and should not be used as a sole basis for treatment. Nasal washings and aspirates are unacceptable for Xpert Xpress SARS-CoV-2/FLU/RSV testing.  Fact Sheet for Patients: EntrepreneurPulse.com.au  Fact Sheet for Healthcare Providers: IncredibleEmployment.be  This test is not yet approved or cleared by the [place] FDA and has been authorized for detection and/or diagnosis of SARS-CoV-2 by FDA under an Emergency Use Authorization (EUA). This EUA will remain in effect (meaning this test can be used) for the duration of the COVID-19 declaration under Section 564(b)(1) of the Act, 21 U.S.C. section 360bbb-3(b)(1), unless the authorization is terminated or revoked.  Performed at [facility], [place] [address], [place], [place] [address]   Urine Culture     Status: Abnormal   Collection Time: 02/05/21  2:06 AM   Specimen: Urine, Clean Catch  Result Value Ref Range Status   Specimen Description   Final    URINE, CLEAN CATCH Performed at [facility], [place] [address], [place], [place] [address]    Special Requests   Final    NONE Performed at [facility], [place] [address], [place], [place] [address]    Culture (A)  Final    <10,000 COLONIES/mL INSIGNIFICANT GROWTH Performed at [place] [address], [place], [place] [address]    Report Status 02/06/2021 FINAL  Final  Culture, blood (routine x 2)     Status: None (Preliminary result)   Collection Time: 02/05/21  5:17 AM   Specimen: BLOOD  Result Value Ref Range Status   Specimen Description   Final    BLOOD RIGHT ANTECUBITAL Performed at [place] [address] [place],  [place] [address]    Special Requests   Final    BOTTLES DRAWN AEROBIC ONLY Blood Culture adequate volume Performed at [place] [address], [place], [place] [address]    Culture   Final    NO GROWTH 2 DAYS Performed at [place] [address], [place],  [address]    Report Status PENDING  Incomplete      Radiology Studies: NM HEPATOBILIARY LEAK (POST-SURGICAL)  Result Date: 02/06/2021 CLINICAL DATA:  Upper abdominal pain. Recent cholecystectomy (01/25/2021). EXAM: NUCLEAR MEDICINE HEPATOBILIARY IMAGING TECHNIQUE: Sequential images of the abdomen were obtained out to 60 minutes following intravenous administration of radiopharmaceutical. RADIOPHARMACEUTICALS:  5.5 mCi Tc-47m Choletec IV COMPARISON:  Abdominal CT 02/04/2021 FINDINGS: There is prompt uptake and biliary excretion of activity by the liver. There is normal passage of activity into the small bowel. There is no focal retention of activity within the cholecystectomy bed or spillage of activity into the peritoneal cavity. IMPRESSION: Normal hepatobiliary scan status post cholecystectomy. No evidence of bile leak. Electronically Signed   By: WRichardean SaleM.D.   On: 02/06/2021 15:10    Scheduled Meds:  ascorbic acid  500 mg Oral Daily   aspirin EC  81 mg Oral q AM   cholecalciferol  2,000 Units Oral Daily   enoxaparin (LOVENOX) injection  40 mg Subcutaneous Q24H   isosorbide mononitrate  90 mg Oral QPM   magnesium oxide  400 mg Oral Daily   pantoprazole  40 mg Oral QHS   potassium chloride SA  10 mEq Oral  QPM   pramipexole  0.25 mg Oral BID   simvastatin  20 mg Oral QPM   sodium chloride flush  5 mL Intracatheter Q8H   zinc sulfate  220 mg Oral Daily   Continuous Infusions:  ceFEPime (MAXIPIME) IV     vancomycin       LOS: 2 days   Marylu Lund, MD [facility] Pager On Amion  If 7PM-7AM, please contact night-coverage 02/07/2021, 2:23 PM

## 2021-02-07 NOTE — Progress Notes (Addendum)
Pharmacy Antibiotic Note  Heather [name] is a 86 y.o. female admitted on 02/04/2021 who recently underwent laparoscopic cholecystectomy with primary repair of an incisional hernia on 7/20.  Yesterday she was seen in urgent clinic with a wound infection. She was noted to have cellulitis at her umbilical incision and was started on Bactrim.  Pt presents to ED with worsening redness and pain at her incision. Pharmacy has been consulted to dose vancomycin and cefepime for wound infection.  Today, Pt Scr improved from 1.46 to 1.08.   Plan: Adjust vancomycin to '1000mg'$  q24h (AUC 511 Scr 1.08) Adjust Cefepime  to 2gm IV q12h Follow renal function and clinical course  Height: '5\' 2"'$  (157.5 cm) Weight: 68.9 kg (152 lb) IBW/kg (Calculated) : 50.1  Temp (24hrs), Avg:98.8 F (37.1 C), Min:97.5 F (36.4 C), Max:99.5 F (37.5 C)  Recent Labs  Lab 02/01/21 0450 02/02/21 0501 02/04/21 2130 02/05/21 0517 02/06/21 0355 02/07/21 0401  WBC 7.5  --  12.4*  --  18.8* 13.1*  CREATININE 1.39* 0.91 1.44*  --  1.46* 1.08*  LATICACIDVEN  --   --  1.7 1.5  --   --      Estimated Creatinine Clearance: 35.3 mL/min (A) (by C-G formula based on SCr of 1.08 mg/dL (H)).    Allergies  Allergen Reactions   Baclofen Other (See Comments)    Hyperactivity    Nitrofurantoin Rash    Antimicrobials this admission: 7/31 vanc >> 7/31 cefepime >>   Dose adjustments this admission: 8/2 Adjust Vancomycin from 1250 q48h to 1000 mg q24h as Scr went from 1.46 to 1.08 Adjust cefepime from 2 gr IV q24h to 2 gr IV q12h     Microbiology results: 7/26 UCx: Kleb oxytoca ( R to amp, I to Unasyn )   7/31 BCx: 1/4 GPR 7/31 UCx: <10k colonies  8/2 Fossa Abscess:   Thank you for allowing pharmacy to be a part of this patient's care.   Royetta Asal, PharmD, BCPS 02/07/2021 1:56 PM

## 2021-02-07 NOTE — Progress Notes (Addendum)
Heather [name] [id number] Oct 01, 1934  CARE TEAM:  PCP: Mosie Lukes, MD  Outpatient Care Team: Patient Care Team: Mosie Lukes, MD as PCP - General (Family Medicine) [name], MD as PCP - Cardiology (Cardiology) Idelle Leech, [place] (Optometry) Janyth Contes, MD as Consulting Physician (Obstetrics and Gynecology) Trula Slade, DPM as Consulting Physician (Podiatry) Sharmon Revere as Physician Assistant (Cardiology) [name], PharmD (Pharmacist) Michael Boston, MD as Consulting Physician (General Surgery) Jaquita Folds, MD as Consulting Physician (Gynecology) Armbruster, Carlota Raspberry, MD as Consulting Physician (Gastroenterology)  Inpatient Treatment Team: Treatment Team: Attending Provider: Donne Hazel, MD; Consulting Physician: Edison Pace, [place], MD; Rounding Team: Fatima Blank, MD; Registered Nurse: Kai Levins, RN; Technician: Burnett Kanaris, Mercer Pod, NT; Technician: Abbe Amsterdam, NT; Technician: Leda Quail, NT; Technician: Samara Snide, NT; Technician: Karna Christmas, NT; Charge Nurse: Steward Ros, RN; Pharmacist: Royetta Asal, [facility]; Utilization Review: Hetty Ely, RN; Consulting Physician: Michael Boston, MD   Problem List:   Principal Problem:   Sepsis [facility]) Active Problems:   Essential hypertension   Coronary atherosclerosis   RLS (restless legs syndrome)   Chronic calculous cholecystitis s/p lap cholecystectomy 01/25/2021   AKI (acute kidney injury) ([place])   Chronic diastolic CHF (congestive heart failure) ([place])   Sepsis due to cellulitis ([place])      01/25/2021  POST-OPERATIVE DIAGNOSIS:    Chronic Calculus cholecystitis Incarcerated incisional hernias   PROCEDURE:  Laparoscopic cholecystectomy with intraoperative cholangiogram (CPT code [phone]) Reduction and primary repair of incarcerated incisional hernias   SURGEON:  [name], MD, FACS.  OR FINDINGS:  Dilated boggy gallbladder with  thickening consistent with chronic cholecystitis.  Very narrow cystic duct with stricturing suspicious for chronic partial cystic duct outlet obstruction.  Cholangiogram with dilated intra and extrahepatic ducts most likely consistent with advanced age.  No evidence of choledocholithiasis nor leak.   Liver: normal no needle biopsy done   Supraumbilical 7 x 3 cm region of incisional hernia incarcerated with omentum.  Largest periumbilical.  Reduction debridement and primary repair done.  No mesh repair given the fact cholecystectomy was done at the same time. SURGICAL PATHOLOGY  CASE: [id number]  PATIENT: [name]  Surgical Pathology Report      Clinical History: Symptomatic biliary colic, probable chronic  cholecystitis, incisional hernia (j[facility])    FINAL MICROSCOPIC DIAGNOSIS:   A. GALLBLADDER, CHOLECYSTECTOMY:  -  Chronic cholecystitis with cholelithiasis   Lincon Sahlin DESCRIPTION:   Specimen: Gallbladder, received fresh  Size/?Intact: 12.2 x 4 x 2.6 cm, intact  Serosal surface: Pink green with few scattered fatty adhesions  Mucosa/Wall: The mucosa varies from light green to red-green, and  smooth, soft.  The wall averages 0.2 cm thick.  Contents: There are multiple black roughened choleliths ranging from  minute fragments to 1.2 cm.  Cystic duct: Patent  Block Summary:  Representative sections in 1 block.   SW 01/26/2021     Final Diagnosis performed by Thressa Sheller, MD.   Electronically signed  01/27/2021  Technical component performed at [facility], [place]  [address], [place], [place] [address].   Professional component performed at [employer]. [facility],  [place] [address] [place], [place] [address].   Immunohistochemistry Technical component (if applicable) was performed  at [facility]. [address], [place],  [place], [place] [address].   IMMUNOHISTOCHEMISTRY DISCLAIMER (if applicable):  Some of these  immunohistochemical stains may have been developed and  the  performance characteristics determine by [facility]. Some  may not have been cleared or approved by the U.S. Food and Drug  Administration. The FDA has determined that such clearance or approval  is not necessary. This test is used for clinical purposes. It should not  be regarded as investigational or for research. This laboratory is  certified under the [place]  (CLIA-88) as qualified to perform high complexity clinical laboratory  testing.  The controls stained appropriately   Assessment  Fair with obvious urinary tract infection and postop gallbladder fossa fluid collection with no definite bile leak and elderly fragile woman.  [facility] Stay = 2 days)  Plan:  Patient still with right upper quadrant pain and chills despite no more fevers and leukocytosis falling on IV ABx for known urinary tract infection.  HIDA scan is negative but she hurts with right upper quadrant pain.  She does not feel that she is getting better with nightly chills persisting.  We will be aggressive and have interventional radiology place a drain in the gallbladder fossa to rule out an abscess.  If seems more consistent with serous or hematoma fluid, can remove in a few days.  Once drain placed, try readvance diet with supplemental shakes.  Physical and Occupational Therapy as tolerated her elderly deconditioned state  Some diabetes and hyperglycemia last admission but seems better now.  She came in somewhat dehydrated with elevated creatinine but that is normalizing.  Some mild hyponatremia.  Defer to medicine but most likely due to fluid resuscitation.  Trying to supplemental shakes with her being malnourished.  Small dehiscence of skin at middle part of wound clean.  Just packed the wound once a day.  Can switch to do ribbon Nu Gauze to avoid over packing.    Continue H2 blockers for  chronic GERD.  Maalox for breakthrough heartburn  Hypertension control  Follow-up on pathology - GB benign  -VTE prophylaxis- SCDs, etc  -mobilize as tolerated to help recovery    Disposition:  Disposition:  The patient is from: Home  Anticipate discharge to:  Home with Home Health  Anticipated Date of Discharge is:  August 4,2022    Barriers to discharge:  Pending Clinical improvement (more likely than not)  Patient currently is NOT MEDICALLY STABLE for discharge from the hospital from a surgery standpoint.      25 minutes spent in review, evaluation, examination, counseling, and coordination of care.   I have reviewed this patient's available data, including medical history, events of note, physical examination and test results as part of my evaluation.  A significant portion of that time was spent in counseling.  Care during the described time interval was provided by me.  02/07/2021    Subjective: (Chief complaint)  Admission noted.  No more fevers but feels chills at night.  Frustrated.  Not nauseated but has decreased appetite.  Complains of soreness in her upper abdomen.  Objective:  Vital signs:  Vitals:   02/06/21 1359 02/06/21 2153 02/07/21 0158 02/07/21 0549  BP: (!) 128/51 (!) 116/51 102/85 107/62  Pulse: 75 66 91 75  Resp: '18 18 16 18  '$ Temp: 99 F (37.2 C) 98.9 F (37.2 C) 99.5 F (37.5 C) 99 F (37.2 C)  TempSrc: Oral Oral Oral Oral  SpO2: 97% 96% 91% 96%  Weight:      Height:        Last BM Date: 02/05/21  Intake/Output   Yesterday:  08/01  0701 - 08/02 0700 In: 723.8 [P.O.:120; I.V.:353.8; IV Piggyback:250] Out: 750 [Urine:750] This shift:  No intake/output data recorded.  Bowel function:  Flatus: YES  BM:  No  Drain: (No drain)   Physical Exam:  General: Pt awake to be alert in no acute distress.  Sitting up in chair.  Annoyed/frustrated. Eyes: PERRL, normal EOM.  Sclera clear.  No icterus Neuro: CN II-XII intact w/o  focal sensory/motor deficits. Lymph: No head/neck/groin lymphadenopathy Psych:  No delerium/psychosis/paranoia.  Oriented x 4 HENT: Normocephalic, Mucus membranes moist.  No thrush Neck: Supple, No tracheal deviation.  No obvious thyromegaly Chest: No pain to chest wall compression.  Good respiratory excursion.  No audible wheezing CV:  Pulses intact.  Regular rhythm.  No major extremity edema MS: Normal AROM mjr joints.  No obvious deformity  Abdomen: Soft.  Nondistended.  Tenderness at right upper quadrant to deep palpation only.  Almost like a mild Murphy sign.  Midline incision closed with 15 mm separation that packed into the subcutaneous tissues.  Fair granulation but no dehiscence or to peritonitis. .  Dressing clean dry and intact.  No evidence of peritonitis.  No incarcerated hernias.  Ext:   No deformity.  No mjr edema.  No cyanosis Skin: No petechiae / purpurea.  No major sores.  Warm and dry    Results:   Cultures: Recent Results (from the past 720 hour(s))  SARS CORONAVIRUS 2 (TAT 6-24 HRS) Nasopharyngeal Nasopharyngeal Swab     Status: None   Collection Time: 01/24/21  2:02 PM   Specimen: Nasopharyngeal Swab  Result Value Ref Range Status   SARS Coronavirus 2 NEGATIVE NEGATIVE Final    Comment: (NOTE) SARS-CoV-2 target nucleic acids are NOT DETECTED.  The SARS-CoV-2 RNA is generally detectable in upper and lower respiratory specimens during the acute phase of infection. Negative results do not preclude SARS-CoV-2 infection, do not rule out co-infections with other pathogens, and should not be used as the sole basis for treatment or other patient management decisions. Negative results must be combined with clinical observations, patient history, and epidemiological information. The expected result is Negative.  Fact Sheet for Patients: SugarRoll.be  Fact Sheet for Healthcare Providers: https://www.woods-mathews.com/  This  test is not yet approved or cleared by the [place] FDA and  has been authorized for detection and/or diagnosis of SARS-CoV-2 by FDA under an Emergency Use Authorization (EUA). This EUA will remain  in effect (meaning this test can be used) for the duration of the COVID-19 declaration under Se ction 564(b)(1) of the Act, 21 U.S.C. section 360bbb-3(b)(1), unless the authorization is terminated or revoked sooner.  Performed at [facility], [place] [address] [place], [place] [address]   Resp Panel by RT-PCR (Flu A&B, Covid) Nasopharyngeal Swab     Status: None   Collection Time: 01/28/21  7:23 PM   Specimen: Nasopharyngeal Swab; Nasopharyngeal(NP) swabs in vial transport medium  Result Value Ref Range Status   SARS Coronavirus 2 by RT PCR NEGATIVE NEGATIVE Final    Comment: (NOTE) SARS-CoV-2 target nucleic acids are NOT DETECTED.  The SARS-CoV-2 RNA is generally detectable in upper respiratory specimens during the acute phase of infection. The lowest concentration of SARS-CoV-2 viral copies this assay can detect is 138 copies/mL. A negative result does not preclude SARS-Cov-2 infection and should not be used as the sole basis for treatment or other patient management decisions. A negative result may occur with  improper specimen collection/handling, submission of specimen other than nasopharyngeal  swab, presence of viral mutation(s) within the areas targeted by this assay, and inadequate number of viral copies(<138 copies/mL). A negative result must be combined with clinical observations, patient history, and epidemiological information. The expected result is Negative.  Fact Sheet for Patients:  EntrepreneurPulse.com.au  Fact Sheet for Healthcare Providers:  IncredibleEmployment.be  This test is no t yet approved or cleared by the [place] FDA and  has been authorized for detection and/or diagnosis of SARS-CoV-2 by FDA under an  Emergency Use Authorization (EUA). This EUA will remain  in effect (meaning this test can be used) for the duration of the COVID-19 declaration under Section 564(b)(1) of the Act, 21 U.S.C.section 360bbb-3(b)(1), unless the authorization is terminated  or revoked sooner.       Influenza A by PCR NEGATIVE NEGATIVE Final   Influenza B by PCR NEGATIVE NEGATIVE Final    Comment: (NOTE) The Xpert Xpress SARS-CoV-2/FLU/RSV plus assay is intended as an aid in the diagnosis of influenza from Nasopharyngeal swab specimens and should not be used as a sole basis for treatment. Nasal washings and aspirates are unacceptable for Xpert Xpress SARS-CoV-2/FLU/RSV testing.  Fact Sheet for Patients: EntrepreneurPulse.com.au  Fact Sheet for Healthcare Providers: IncredibleEmployment.be  This test is not yet approved or cleared by the [place] FDA and has been authorized for detection and/or diagnosis of SARS-CoV-2 by FDA under an Emergency Use Authorization (EUA). This EUA will remain in effect (meaning this test can be used) for the duration of the COVID-19 declaration under Section 564(b)(1) of the Act, 21 U.S.C. section 360bbb-3(b)(1), unless the authorization is terminated or revoked.  Performed at [facility], [place] [address] [place], [place] [address]   Urine Culture     Status: Abnormal   Collection Time: 01/31/21 12:49 PM   Specimen: Urine, Clean Catch  Result Value Ref Range Status   Specimen Description   Final    URINE, CLEAN CATCH Performed at [facility], [place] [address] [place], [place] [address]    Special Requests   Final    NONE Performed at [facility], [place] [address], [place], [place] [address]    Culture >=100,000 COLONIES/mL KLEBSIELLA OXYTOCA (A)  Final   Report Status 02/02/2021 FINAL  Final   Organism ID, Bacteria KLEBSIELLA OXYTOCA (A)  Final      Susceptibility    Klebsiella oxytoca - MIC*    AMPICILLIN >=32 RESISTANT Resistant     CEFAZOLIN 8 SENSITIVE Sensitive     CEFEPIME <=0.12 SENSITIVE Sensitive     CEFTRIAXONE <=0.25 SENSITIVE Sensitive     CIPROFLOXACIN <=0.25 SENSITIVE Sensitive     GENTAMICIN <=1 SENSITIVE Sensitive     IMIPENEM <=0.25 SENSITIVE Sensitive     NITROFURANTOIN <=16 SENSITIVE Sensitive     TRIMETH/SULFA <=20 SENSITIVE Sensitive     AMPICILLIN/SULBACTAM 16 INTERMEDIATE Intermediate     PIP/TAZO <=4 SENSITIVE Sensitive     * >=100,000 COLONIES/mL KLEBSIELLA OXYTOCA  Culture, blood (routine x 2)     Status: None (Preliminary result)   Collection Time: 02/04/21  9:30 PM   Specimen: BLOOD  Result Value Ref Range Status   Specimen Description   Final    BLOOD SITE NOT SPECIFIED Performed at [place] [address], [place], [place] [address]    Special Requests   Final    BOTTLES DRAWN AEROBIC AND ANAEROBIC Blood Culture adequate volume Performed at [place] [address] [place],  [address]  Culture  Setup Time   Final    GRAM POSITIVE RODS ANAEROBIC BOTTLE ONLY CRITICAL RESULT CALLED TO, READ BACK BY AND VERIFIED WITH: m. Dufur, AT [id number] 02/07/21 [place] Performed at [facility], [place] [address], [place], [place] [address]    Culture GRAM POSITIVE RODS  Final   Report Status PENDING  Incomplete  Resp Panel by RT-PCR (Flu A&B, Covid) Nasopharyngeal Swab     Status: None   Collection Time: 02/04/21  9:30 PM   Specimen: Nasopharyngeal Swab; Nasopharyngeal(NP) swabs in vial transport medium  Result Value Ref Range Status   SARS Coronavirus 2 by RT PCR NEGATIVE NEGATIVE Final    Comment: (NOTE) SARS-CoV-2 target nucleic acids are NOT DETECTED.  The SARS-CoV-2 RNA is generally detectable in upper respiratory specimens during the acute phase of infection. The lowest concentration of SARS-CoV-2 viral copies this assay can detect is 138 copies/mL.  A negative result does not preclude SARS-Cov-2 infection and should not be used as the sole basis for treatment or other patient management decisions. A negative result may occur with  improper specimen collection/handling, submission of specimen other than nasopharyngeal swab, presence of viral mutation(s) within the areas targeted by this assay, and inadequate number of viral copies(<138 copies/mL). A negative result must be combined with clinical observations, patient history, and epidemiological information. The expected result is Negative.  Fact Sheet for Patients:  EntrepreneurPulse.com.au  Fact Sheet for Healthcare Providers:  IncredibleEmployment.be  This test is no t yet approved or cleared by the [place] FDA and  has been authorized for detection and/or diagnosis of SARS-CoV-2 by FDA under an Emergency Use Authorization (EUA). This EUA will remain  in effect (meaning this test can be used) for the duration of the COVID-19 declaration under Section 564(b)(1) of the Act, 21 U.S.C.section 360bbb-3(b)(1), unless the authorization is terminated  or revoked sooner.       Influenza A by PCR NEGATIVE NEGATIVE Final   Influenza B by PCR NEGATIVE NEGATIVE Final    Comment: (NOTE) The Xpert Xpress SARS-CoV-2/FLU/RSV plus assay is intended as an aid in the diagnosis of influenza from Nasopharyngeal swab specimens and should not be used as a sole basis for treatment. Nasal washings and aspirates are unacceptable for Xpert Xpress SARS-CoV-2/FLU/RSV testing.  Fact Sheet for Patients: EntrepreneurPulse.com.au  Fact Sheet for Healthcare Providers: IncredibleEmployment.be  This test is not yet approved or cleared by the [place] FDA and has been authorized for detection and/or diagnosis of SARS-CoV-2 by FDA under an Emergency Use Authorization (EUA). This EUA will remain in effect (meaning this test  can be used) for the duration of the COVID-19 declaration under Section 564(b)(1) of the Act, 21 U.S.C. section 360bbb-3(b)(1), unless the authorization is terminated or revoked.  Performed at [facility], [place] [address] [place], [place] [address]   Urine Culture     Status: Abnormal   Collection Time: 02/05/21  2:06 AM   Specimen: Urine, Clean Catch  Result Value Ref Range Status   Specimen Description   Final    URINE, CLEAN CATCH Performed at [facility], [place] [address], [place], [place] [address]    Special Requests   Final    NONE Performed at [facility], [place] [address] [place], [place] [address]    Culture (A)  Final    <10,000 COLONIES/mL INSIGNIFICANT GROWTH Performed at [place] [address], [place],  [address]  Report Status 02/06/2021 FINAL  Final  Culture, blood (routine x 2)     Status: None (Preliminary result)   Collection Time: 02/05/21  5:17 AM   Specimen: BLOOD  Result Value Ref Range Status   Specimen Description   Final    BLOOD RIGHT ANTECUBITAL Performed at [place] [address] [place], [place] [address]    Special Requests   Final    BOTTLES DRAWN AEROBIC ONLY Blood Culture adequate volume Performed at [place] [address] [place], [place] [address]    Culture   Final    NO GROWTH 1 DAY Performed at [facility], [place] [address], [place], [place] [address]    Report Status PENDING  Incomplete    Labs: Results for orders placed or performed during the hospital encounter of 02/04/21 (from the past 48 hour(s))  Comprehensive metabolic panel     Status: Abnormal   Collection Time: 02/06/21  3:55 AM  Result Value Ref Range   Sodium 132 (L) 135 - 145 mmol/L   Potassium 4.8 3.5 - 5.1 mmol/L   Chloride 100 98 - 111 mmol/L   CO2 21 (L) 22 - 32 mmol/L   Glucose, Bld 93 70 - 99 mg/dL    Comment: Glucose  reference range applies only to samples taken after fasting for at least 8 hours.   BUN 25 (H) 8 - 23 mg/dL   Creatinine, Ser 1.46 (H) 0.44 - 1.00 mg/dL   Calcium 8.0 (L) 8.9 - 10.3 mg/dL   Total Protein 5.4 (L) 6.5 - 8.1 g/dL   Albumin 2.6 (L) 3.5 - 5.0 g/dL   AST 24 15 - 41 U/L   ALT 20 0 - 44 U/L   Alkaline Phosphatase 112 38 - 126 U/L   Total Bilirubin 0.7 0.3 - 1.2 mg/dL   GFR, Estimated 35 (L) >60 mL/min    Comment: (NOTE) Calculated using the CKD-EPI Creatinine Equation (2021)    Anion gap 11 5 - 15    Comment: Performed at [facility], [place] [address], [place], [place] [address]  CBC     Status: Abnormal   Collection Time: 02/06/21  3:55 AM  Result Value Ref Range   WBC 18.8 (H) 4.0 - 10.5 K/uL   RBC 3.15 (L) 3.87 - 5.11 MIL/uL   Hemoglobin 9.5 (L) 12.0 - 15.0 g/dL   HCT 29.4 (L) 36.0 - 46.0 %   MCV 93.3 80.0 - 100.0 fL   MCH 30.2 26.0 - 34.0 pg   MCHC 32.3 30.0 - 36.0 g/dL   RDW 13.0 11.5 - 15.5 %   Platelets 222 150 - 400 K/uL   nRBC 0.0 0.0 - 0.2 %    Comment: Performed at [facility], [place] [address] [place], [place] [address]  Protime-INR (coagulopathy lab panel)     Status: None   Collection Time: 02/07/21  4:01 AM  Result Value Ref Range   Prothrombin Time 14.5 11.4 - 15.2 seconds   INR 1.1 0.8 - 1.2    Comment: (NOTE) INR goal varies based on device and disease states. Performed at [facility], [place] [address], [place], [place] [address]   APTT (coagulopathy lab panel)     Status: None   Collection Time: 02/07/21  4:01 AM  Result Value Ref Range   aPTT 32 24 - 36 seconds    Comment: Performed at [facility], [address] [place], [place] [address]  Fibrinogen (coagulopathy lab panel)     Status: Abnormal   Collection Time: 02/07/21  4:01 AM  Result Value Ref Range   Fibrinogen 572 (H) 210 - 475 mg/dL    Comment: (NOTE) Fibrinogen results may be underestimated in patients  receiving thrombolytic therapy. Performed at [facility], [place] [address] [place], [place] [address]   Comprehensive metabolic panel     Status: Abnormal   Collection Time: 02/07/21  4:01 AM  Result Value Ref Range   Sodium 130 (L) 135 - 145 mmol/L   Potassium 4.8 3.5 - 5.1 mmol/L   Chloride 103 98 - 111 mmol/L   CO2 18 (L) 22 - 32 mmol/L   Glucose, Bld 127 (H) 70 - 99 mg/dL    Comment: Glucose reference range applies only to samples taken after fasting for at least 8 hours.   BUN 19 8 - 23 mg/dL   Creatinine, Ser 1.08 (H) 0.44 - 1.00 mg/dL   Calcium 8.1 (L) 8.9 - 10.3 mg/dL   Total Protein 5.6 (L) 6.5 - 8.1 g/dL   Albumin 2.7 (L) 3.5 - 5.0 g/dL   AST 25 15 - 41 U/L   ALT 20 0 - 44 U/L   Alkaline Phosphatase 138 (H) 38 - 126 U/L   Total Bilirubin 0.9 0.3 - 1.2 mg/dL   GFR, Estimated 51 (L) >60 mL/min    Comment: (NOTE) Calculated using the CKD-EPI Creatinine Equation (2021)    Anion gap 9 5 - 15    Comment: Performed at [facility], [place] [address] [place], [place] [address]  CBC     Status: Abnormal   Collection Time: 02/07/21  4:01 AM  Result Value Ref Range   WBC 13.1 (H) 4.0 - 10.5 K/uL   RBC 3.34 (L) 3.87 - 5.11 MIL/uL   Hemoglobin 10.1 (L) 12.0 - 15.0 g/dL   HCT 31.6 (L) 36.0 - 46.0 %   MCV 94.6 80.0 - 100.0 fL   MCH 30.2 26.0 - 34.0 pg   MCHC 32.0 30.0 - 36.0 g/dL   RDW 13.0 11.5 - 15.5 %   Platelets 198 150 - 400 K/uL   nRBC 0.0 0.0 - 0.2 %    Comment: Performed at [facility], [place] [address] [place], [place] [address]    Imaging / Studies: CT HEAD WO CONTRAST  Result Date: 02/05/2021 CLINICAL DATA:  Transient ischemic attack (TIA). Additional history provided: Patient reports "floaters" in eyes. EXAM: CT HEAD WITHOUT CONTRAST TECHNIQUE: Contiguous axial images were obtained from the base of the skull through the vertex without intravenous contrast. COMPARISON:  Brain MRI 08/13/2017.  Head CT  04/01/2013. FINDINGS: Brain: Mild generalized parenchymal atrophy. Mild-to-moderate patchy and ill-defined hypoattenuation within the cerebral white matter, nonspecific but compatible with chronic small vessel ischemic disease. Bilateral basal ganglia mineralization. There is no acute intracranial hemorrhage. No demarcated cortical infarct. No extra-axial fluid collection. No evidence of an intracranial mass. No midline shift. Vascular: No hyperdense vessel.  Atherosclerotic calcifications Skull: Normal. Negative for fracture or focal lesion. Sinuses/Orbits: Visualized orbits show no acute finding. No significant paranasal sinus disease at the imaged levels. IMPRESSION: No evidence of acute intracranial abnormality. Mild-to-moderate chronic small vessel ischemic changes within the cerebral white matter, progressed from the head CT of 04/01/2013. Mild generalized parenchymal atrophy. Electronically Signed   By: Kellie Simmering DO   On: 02/05/2021 13:18   NM HEPATOBILIARY LEAK (POST-SURGICAL)  Result Date: 02/06/2021  CLINICAL DATA:  Upper abdominal pain. Recent cholecystectomy (01/25/2021). EXAM: NUCLEAR MEDICINE HEPATOBILIARY IMAGING TECHNIQUE: Sequential images of the abdomen were obtained out to 60 minutes following intravenous administration of radiopharmaceutical. RADIOPHARMACEUTICALS:  5.5 mCi Tc-64m Choletec IV COMPARISON:  Abdominal CT 02/04/2021 FINDINGS: There is prompt uptake and biliary excretion of activity by the liver. There is normal passage of activity into the small bowel. There is no focal retention of activity within the cholecystectomy bed or spillage of activity into the peritoneal cavity. IMPRESSION: Normal hepatobiliary scan status post cholecystectomy. No evidence of bile leak. Electronically Signed   By: WRichardean SaleM.D.   On: 02/06/2021 15:10    Medications / Allergies: per chart  Antibiotics: Anti-infectives (From admission, onward)    Start     Dose/Rate Route Frequency Ordered  Stop   02/06/21 0200  vancomycin (VANCOREADY) IVPB 1250 mg/250 mL        1,250 mg 166.7 mL/hr over 90 Minutes Intravenous Every 24 hours 02/05/21 0227     02/05/21 0030  vancomycin (VANCOCIN) IVPB 1000 mg/200 mL premix        1,000 mg 200 mL/hr over 60 Minutes Intravenous  Once 02/05/21 0015 02/05/21 0535   02/05/21 0030  ceFEPIme (MAXIPIME) 2 g in sodium chloride 0.9 % 100 mL IVPB        2 g 200 mL/hr over 30 Minutes Intravenous Every 24 hours 02/05/21 0015           Note: Portions of this report may have been transcribed using voice recognition software. Every effort was made to ensure accuracy; however, inadvertent computerized transcription errors may be present.   Any transcriptional errors that result from this process are unintentional.    SAdin Hector MD, FACS, MASCRS Esophageal, Gastrointestinal & Colorectal Surgery Robotic and Minimally Invasive Surgery  [facility] [place] [address] [place] [address] [place] [phone] [phone]  CONTACT INFORMATION:  Weekday (9AM-5PM): Call [facility] main office at [phone] Weeknight (5PM-9AM) or Weekend/Holiday: Check [email or web address] (password " [email or web address]") for General Surgery [facility] coverage  (Please, do not use SecureChat as it is not reliable communication to operating surgeons for immediate patient care)      02/07/2021  7:54 AM

## 2021-02-07 NOTE — Progress Notes (Signed)
Referring Physician(s): Gross,S  Supervising Physician: Markus Daft  Patient Status:  [facility] - In-pt  Chief Complaint:  Right upper abdominal pain/gallbladder fossa fluid collection  Subjective: Patient known to IR/NIR service from cerebral arteriogram in 2001.  She is an 86 year old female with history of coronary artery disease with prior stenting, cervical cancer, esophageal stricture, gastric ulcer, prior TIA, DVT, diabetes, GERD, osteoarthritis, hyperlipidemia, restless leg syndrome, CHF  and chronic calculus cholecystitis with prior laparoscopic cholecystectomy and primary incisional hernia repair on 01/25/21.  She was admitted to [facility] on 7/30 with intermittent fevers, drainage from her hernia repair incision site and right upper quadrant abdominal pain.  She has been on antibiotic therapy.  Recent CT revealed increased size of fluid collection in gallbladder fossa with small amount of fluid and air tracking superficial to the anterior liver.  Nuclear medicine hepatobiliary study yesterday was negative for bile leak.  Current labs include WBC 13.1, hemoglobin 10.1, platelets 198k, creatinine 1.08, total bilirubin 0.9, PT/INR normal.  Urine culture with Klebsiella oxytoca, COVID-19 negative, blood culture with gram-positive rods.  Request now received from surgical team for image guided drainage of the gallbladder fossa fluid collection.  She currently denies fever, headache, chest pain, cough, nausea, vomiting or bleeding.  She continues to have right upper quadrant discomfort, some dyspnea and restless legs.  Past Medical History:  Diagnosis Date   Chronic stable angina [facility])    Coronary artery disease cardiologist--- dr Burt Knack   hx NSTEMI  w/ cardiac cath 03-16-2005 PCI with DES to LCx;   cardiac cath 06-27-2005  PCI with DES to RCA with residual dx LAD manage medcially;  lexiscan 01-26-20211 normal no ischemia, ef 70%;  stress echo w/ dobutamine 10-24-2011 negative  ishcmeia , normal ef // Myoview 2/22: EF 67, no ischemia or infarction, no TID, low risk    Diabetes mellitus type 2, diet-controlled ([place])    followed by pcp  (10-19-2020  pt stated checks daily in am,  fasting blood surgar--- 115--120s)   DOE (dyspnea on exertion)    per pt "when I over do",  ok with household chores   Echocardiogram 08/2020    Echocardiogram 2/22: EF 55-60, no RWMA, mild LVH, Gr 2 DD, GLS-21.7%, normal RVSF, trivial MR, RVSP 39.5   Edema of right lower extremity    GERD (gastroesophageal reflux disease)    Hiatal hernia    recurrence,  hx HH repair 1989   History of cervical cancer    s/p  vaginal hysterectomy   History of DVT of lower extremity 2016   11-29-2014 post op right TKA of right lower extremity and completed xarelto    History of esophageal stricture    hx s/p dilatation's   History of gastric ulcer 2005 approx.   History of palpitations 2010   event monitor 07-07-2009 showed NSR w/ freq. SVT ectopies with short runs, rare PVCs   History of TIA (transient ischemic attack) 06/1999   12-15-2019  per pt had several TIA between 12/ 2000 to 02/ 2001 , was sent to specialist '@[facility]' , had test that was normal (10-19-2020 pt stated no TIAs since ) but has residual of essential tremors of right arm/ hand   Hypertension    Intermittent palpitations    IT band syndrome    Migraine    "ice pick headche lasts about 30 seconds"   Mixed hyperlipidemia    Mixed incontinence urge and stress    Multiple thyroid nodules    followed  by pcp---   ultrasound 11-22-2014 no bx   (12-15-2019 per pt had a endocrinologist and was told did not need bx)   OA (osteoarthritis)    knees, elbow, hip, ankles   Occasional tremors    right arm/ hand  s/p TIA residual 2000   Osteoporosis    taking vitamin d   Peroneal DVT (deep venous thrombosis) ([place]) 12/22/2014   Right bundle branch block (RBBB) with left anterior fascicular block (LAFB)    RLS (restless legs syndrome)    S/P drug  eluting coronary stent placement 2006   03-16-2005  PCI x1 DES to LCx;   06-27-2005  PCI x1 DES to RCA   Urinary retention    post op sling prodecure on 10-27-2020, has foley cathether      Allergies: Baclofen and Nitrofurantoin  Medications: Prior to Admission medications   Medication Sig Start Date End Date Taking? Authorizing Provider  acetaminophen (TYLENOL) 500 MG tablet Take 1,000 mg by mouth every 6 (six) hours as needed for moderate pain.   Yes [provider]  amLODipine (NORVASC) 10 MG tablet TAKE 1 TABLET EVERY DAY 02/01/21  Yes Mosie Lukes, MD  Ascorbic Acid (VITAMIN C) 500 MG CHEW Chew 500 mg by mouth daily.   Yes [provider]  aspirin EC 81 MG tablet Take 1 tablet (81 mg total) by mouth daily. Patient taking differently: Take 81 mg by mouth in the morning. 05/04/15  Yes [name], MD  Blood Glucose Monitoring Suppl (TRUE METRIX AIR GLUCOSE METER) w/Device KIT USE TO CHECK BLOOD SUGAR ONCE DAILY AND AS NEEDED.  DX CODE E11.9 07/27/20  Yes Mosie Lukes, MD  Cholecalciferol (VITAMIN D) 50 MCG (2000 UT) CAPS Take 2,000 Units by mouth daily.    Yes [provider]  diclofenac Sodium (VOLTAREN) 1 % GEL Apply 1 application topically 4 (four) times daily as needed (hip pain).   Yes [provider]  famotidine (PEPCID) 20 MG tablet Take 20 mg by mouth at bedtime as needed for heartburn or indigestion.   Yes [provider]  furosemide (LASIX) 20 MG tablet Take 1 tablet (20 mg total) by mouth daily as needed for edema (weight gain greater than 3 pounds in 1 day or 5 pounds in 1 week). 02/01/21 05/02/21 Yes [place] Eric J, DO  glucose blood (TRUE METRIX BLOOD GLUCOSE TEST) test strip USE TO CHECK BLOOD SUGAR ONCE A DAY OR AS NEEDED.  DX CODE: E11.9 07/27/20  Yes Mosie Lukes, MD  isosorbide mononitrate (IMDUR) 60 MG 24 hr tablet Take 1.5 tablets (90 mg total) by mouth daily. Patient taking differently: Take 90 mg by mouth every  evening. 12/14/20 12/14/21 Yes Weaver, Scott T, PA-C  ketorolac (TORADOL) 10 MG tablet Take 1 tablet (10 mg total) by mouth every 6 (six) hours as needed. Patient taking differently: Take 10 mg by mouth every 6 (six) hours as needed (pain). 10/30/20  Yes Jaquita Folds, MD  magnesium oxide (MAG-OX) 400 MG tablet Take 400 mg by mouth at bedtime.   Yes [provider]  metoprolol succinate (TOPROL-XL) 50 MG 24 hr tablet Take 1.5 tablets (75 mg total) by mouth at bedtime. Take with or immediately following a meal. 12/14/20 12/14/21 Yes Weaver, Scott T, PA-C  nitroGLYCERIN (NITROSTAT) 0.4 MG SL tablet Place 1 tablet (0.4 mg total) under the tongue every 5 (five) minutes as needed for chest pain. Patient taking differently: Place 0.4 mg under the tongue every 5 (five)  minutes x 3 doses as needed for chest pain. 08/29/20 08/29/21 Yes Weaver, Scott T, PA-C  omeprazole (PRILOSEC) 20 MG capsule TAKE 1 CAPSULE EVERY DAY AS NEEDED Patient taking differently: Take 20 mg by mouth in the morning. 01/02/21  Yes Mosie Lukes, MD  ondansetron (ZOFRAN ODT) 4 MG disintegrating tablet Take 1 tablet (4 mg total) by mouth every 6 (six) hours as needed for nausea or vomiting. 12/15/19  Yes Armbruster, Carlota Raspberry, MD  potassium chloride SA (KLOR-CON) 20 MEQ tablet Take 0.5 tablets (10 mEq total) by mouth daily. Patient taking differently: Take 10 mEq by mouth every evening. 09/14/20  Yes Weaver, Scott T, PA-C  pramipexole (MIRAPEX) 0.25 MG tablet Take 1 tablet (0.25 mg total) by mouth 2 (two) times daily. 09/26/20  Yes Mosie Lukes, MD  simvastatin (ZOCOR) 20 MG tablet TAKE 1 TABLET AT BEDTIME Patient taking differently: Take 20 mg by mouth every evening. 09/01/20  Yes Weaver, Scott T, PA-C  sulfamethoxazole-trimethoprim (BACTRIM DS) 800-160 MG tablet Take 1 tablet by mouth 2 (two) times daily. 02/03/21  Yes [provider]  traMADol (ULTRAM) 50 MG tablet Take 1 tablet (50 mg total) by mouth every 6 (six) hours as  needed for severe pain. 01/26/21  Yes Michael Boston, MD  TRUEplus Lancets 33G MISC USE TO CHECK BLOOD SUGAR ONCE DAILY AND AS NEEDED.  DX CODE: E11.9 07/27/20  Yes Mosie Lukes, MD  Zinc 50 MG TABS Take 50 mg by mouth daily.   Yes [provider]  HYDROcodone-acetaminophen (NORCO/VICODIN) 5-325 MG tablet Take 1 tablet by mouth every 6 (six) hours as needed for moderate pain. 02/03/21 02/08/21  [provider]  mirabegron ER (MYRBETRIQ) 25 MG TB24 tablet Take 1 tablet (25 mg total) by mouth daily. 01/23/21   Jaquita Folds, MD     Vital Signs: BP 107/62 (BP Location: Left Arm)   Pulse 75   Temp 99 F (37.2 C) (Oral)   Resp 18   Ht '5\' 2"'  (1.575 m)   Wt 152 lb (68.9 kg)   LMP  (LMP Unknown)   SpO2 96%   BMI 27.80 kg/m   Physical Exam awake, alert.  Chest with slightly diminished breath sounds at bases.  Heart with regular rate and rhythm.  Abdomen soft, right upper quadrant tenderness noted to palpation, midline incision covered with gauze dressing, few bowel sounds, no lower extremity edema.  Imaging: CT HEAD WO CONTRAST  Result Date: 02/05/2021 CLINICAL DATA:  Transient ischemic attack (TIA). Additional history provided: Patient reports "floaters" in eyes. EXAM: CT HEAD WITHOUT CONTRAST TECHNIQUE: Contiguous axial images were obtained from the base of the skull through the vertex without intravenous contrast. COMPARISON:  Brain MRI 08/13/2017.  Head CT 04/01/2013. FINDINGS: Brain: Mild generalized parenchymal atrophy. Mild-to-moderate patchy and ill-defined hypoattenuation within the cerebral white matter, nonspecific but compatible with chronic small vessel ischemic disease. Bilateral basal ganglia mineralization. There is no acute intracranial hemorrhage. No demarcated cortical infarct. No extra-axial fluid collection. No evidence of an intracranial mass. No midline shift. Vascular: No hyperdense vessel.  Atherosclerotic calcifications Skull: Normal. Negative for  fracture or focal lesion. Sinuses/Orbits: Visualized orbits show no acute finding. No significant paranasal sinus disease at the imaged levels. IMPRESSION: No evidence of acute intracranial abnormality. Mild-to-moderate chronic small vessel ischemic changes within the cerebral white matter, progressed from the head CT of 04/01/2013. Mild generalized parenchymal atrophy. Electronically Signed   By: Kellie Simmering DO   On: 02/05/2021 13:18  CT ABDOMEN PELVIS W CONTRAST  Result Date: 02/04/2021 CLINICAL DATA:  Abdominal pain. Gallbladder inter knee a surgery 720, recent hospital discharge. Drainage from incision and fever. EXAM: CT ABDOMEN AND PELVIS WITH CONTRAST TECHNIQUE: Multidetector CT imaging of the abdomen and pelvis was performed using the standard protocol following bolus administration of intravenous contrast. CONTRAST:  27m OMNIPAQUE IOHEXOL 350 MG/ML SOLN COMPARISON:  Postoperative CT 01/28/2021 FINDINGS: Lower chest: Diminished pleural effusions. Improving basilar atelectasis, more so on the right. Stable moderate-sized hiatal hernia. Coronary artery calcifications. Hepatobiliary: Again seen low-density adjacent to the falciform ligament. No intrahepatic fluid collection. Post recent cholecystectomy. Increased size of fluid collection in the gallbladder fossa currently measuring 4 cm, previously 3.3 cm. Collection contains heterogeneous fluid and air, with small amount of fluid and air tracking superficial to the anterior liver. Minimal periportal edema without biliary dilatation. No common bile duct dilatation. Pancreas: No ductal dilatation or inflammation. Spleen: Normal in size without focal abnormality. Adrenals/Urinary Tract: Normal adrenal glands. No hydronephrosis or renal calculi. Stable cysts in the right kidney. Lobulated renal contours again seen. Urinary bladder is partially distended. Focus of air in the urinary bladder likely related to prior Foley catheter. Equivocal bladder wall  thickening. Stomach/Bowel: Moderate hiatal hernia, similar to prior. Decompressed stomach. There is no small bowel obstruction or inflammation. High-density barium within the colon from prior esophagram. Normal appendix. Left colonic diverticulosis without diverticulitis. No colonic inflammation. Vascular/Lymphatic: Moderate aortic atherosclerosis. Patent portal vein. No acute vascular findings. No enlarged lymph nodes in the abdomen or pelvis Reproductive: Status post hysterectomy. No adnexal masses. Other: Trace air in the cholecystectomy bed that tracks anterior in peripheral to the liver. Trace perihepatic fluid. Gallbladder fossa fluid collection as described above. No other focal fluid collection. No significant ascites/free fluid. There is postsurgical change of the anterior abdominal wall. There is irregularity at the skin surface with small foci of gas in the subcutaneous tissues but no drainable collection. Expected stranding of the subcutaneous tissues. Minimal stranding in the subjacent omental fat. Similar appearance of fat containing hernia just above postsurgical change, hernia neck spanning 4.4 cm transverse, series 2, image 32. Minimal air in the anterior abdominal wall which is improving from prior exam. Musculoskeletal: Stable degenerative change in the spine. No acute findings. IMPRESSION: 1. Post recent cholecystectomy. Increased size of fluid collection in the gallbladder fossa currently measuring 4 cm, previously 3.3 cm. Collection contains heterogeneous fluid and air, with small amount of fluid and air tracking superficial to the anterior liver. Differential considerations include in postoperative seroma, +/- infection, or biloma. 2. Postsurgical change of the anterior abdominal wall with irregular skin surface and small foci of gas in the subcutaneous tissues but no drainable collection. 3. Similar appearance of fat containing hernia just above postsurgical change. 4. Equivocal bladder wall  thickening. 5. Colonic diverticulosis without diverticulitis. 6. Diminished pleural effusions and basilar atelectasis. 7. Moderate hiatal hernia. Aortic Atherosclerosis (ICD10-I70.0). Electronically Signed   By: MKeith RakeM.D.   On: 02/04/2021 23:56   DG Chest Portable 1 View  Result Date: 02/04/2021 CLINICAL DATA:  Shortness of breath. EXAM: PORTABLE CHEST 1 VIEW COMPARISON:  Radiograph and chest CT 01/28/2021. FINDINGS: Lower lung volumes from prior exam. Patient is rotated. Grossly stable heart size and mediastinal contours. Increasing right basilar opacity. Small pleural effusions. No pulmonary edema. No pneumothorax. IMPRESSION: 1. Lower lung volumes from prior exam. Increasing right basilar opacity, favor atelectasis over pneumonia. 2. Small pleural effusions. Electronically Signed   By: MThreasa Beards  San[place] M.D.   On: 02/04/2021 21:44   NM HEPATOBILIARY LEAK (POST-SURGICAL)  Result Date: 02/06/2021 CLINICAL DATA:  Upper abdominal pain. Recent cholecystectomy (01/25/2021). EXAM: NUCLEAR MEDICINE HEPATOBILIARY IMAGING TECHNIQUE: Sequential images of the abdomen were obtained out to 60 minutes following intravenous administration of radiopharmaceutical. RADIOPHARMACEUTICALS:  5.5 mCi Tc-63m Choletec IV COMPARISON:  Abdominal CT 02/04/2021 FINDINGS: There is prompt uptake and biliary excretion of activity by the liver. There is normal passage of activity into the small bowel. There is no focal retention of activity within the cholecystectomy bed or spillage of activity into the peritoneal cavity. IMPRESSION: Normal hepatobiliary scan status post cholecystectomy. No evidence of bile leak. Electronically Signed   By: WRichardean SaleM.D.   On: 02/06/2021 15:10    Labs:  CBC: Recent Labs    02/01/21 0450 02/04/21 2130 02/06/21 0355 02/07/21 0401  WBC 7.5 12.4* 18.8* 13.1*  HGB 11.6* 11.9* 9.5* 10.1*  HCT 35.9* 36.3 29.4* 31.6*  PLT 186 291 222 198    COAGS: Recent Labs    02/07/21 0401   INR 1.1  APTT 32    BMP: Recent Labs    08/19/20 1351 08/26/20 2159 02/02/21 0501 02/04/21 2130 02/06/21 0355 02/07/21 0401  NA 140   < > 135 131* 132* 130*  K 4.8   < > 4.0 4.6 4.8 4.8  CL 101   < > 98 99 100 103  CO2 22   < > 26 23 21* 18*  GLUCOSE 130*   < > 118* 118* 93 127*  BUN 22   < > 17 19 25* 19  CALCIUM 9.5   < > 8.4* 8.7* 8.0* 8.1*  CREATININE 1.04*   < > 0.91 1.44* 1.46* 1.08*  GFRNONAA 50*   < > >60 36* 35* 51*  GFRAA 57*  --   --   --   --   --    < > = values in this interval not displayed.    LIVER FUNCTION TESTS: Recent Labs    01/31/21 0457 02/01/21 0450 02/04/21 2130 02/06/21 0355 02/07/21 0401  BILITOT 0.9  --  0.6 0.7 0.9  AST 20  --  '28 24 25  ' ALT 42  --  '27 20 20  ' ALKPHOS 159*  --  164* 112 138*  PROT 5.8*  --  6.6 5.4* 5.6*  ALBUMIN 3.0* 3.1* 3.1* 2.6* 2.7*    Assessment and Plan: Patient known to IR/NIR service from cerebral arteriogram in 2001.  She is an 86year old female with history of coronary artery disease with prior stenting, cervical cancer, esophageal stricture, gastric ulcer, prior TIA, DVT, diabetes, GERD, osteoarthritis, hyperlipidemia, restless leg syndrome, CHF ,and chronic calculus cholecystitis with prior laparoscopic cholecystectomy and primary incisional hernia repair on 01/25/21.  [facility], drainage from her hernia repair incision site and right upper quadrant abdominal pain.  She has been on antibiotic therapy.  Recent CT revealed increased size of fluid collection in gallbladder fossa with small amount of fluid and air tracking superficial to the anterior liver.  Nuclear medicine hepatobiliary study yesterday was negative for bile leak.  Current labs include WBC 13.1, hemoglobin 10.1, platelets 198k, creatinine 1.08, total bilirubin 0.9, PT/INR normal.  Urine culture with Klebsiella oxytoca, COVID-19 negative, blood culture with gram-positive rods.  Request now  received from surgical team for image guided drainage of the gallbladder fossa fluid collection.  Imaging studies were reviewed by Dr. HAnselm Pancoast  Risks and benefits discussed with the patient /daughter Anthoney Harada including bleeding, infection, damage to adjacent structures, bowel perforation/fistula connection, and sepsis.  All of the patient's questions were answered, patient is agreeable to proceed. Consent signed and in chart. Procedure scheduled for later today.   Electronically Signed: D. Rowe Robert, PA-C 02/07/2021, 9:49 AM   I spent a total of 25 minutes at the the patient's bedside AND on the patient's hospital floor or unit, greater than 50% of which was counseling/coordinating care for image guided drainage of gallbladder fossa fluid collection    Patient ID: LIL LEPAGE, female   DOB: 09-27-1934, 86 y.o.   MRN: [id number]

## 2021-02-07 NOTE — Progress Notes (Signed)
rm [place] 86yoF readmit w/ sepsis, wound site infection.  Pls. change MSO4 fr 0.5 mg to 0.5 to '1mg'$ . bec 0.'5mg'$  she's still hurts.

## 2021-02-07 NOTE — Procedures (Signed)
Interventional Radiology Procedure:   Indications: Post operative abscess at gallbladder fossa  Procedure: CT guided drain placement  Findings: 15 ml of pink colored purulent fluid removed  Complications: No immediate complications noted.     EBL: Minimal  Plan: Send fluid for culture.  Follow drain output.     Heather [name] R. Anselm Pancoast, MD  Pager: [phone]

## 2021-02-07 NOTE — Progress Notes (Signed)
PHARMACY - PHYSICIAN COMMUNICATION CRITICAL VALUE ALERT - BLOOD CULTURE IDENTIFICATION (BCID)  Heather [name] is an 86 y.o. female who presented to [facility] on 02/04/2021 with a chief complaint of cellulitis around incision site + fever.   Assessment:  She is POD13 s/p lap chole + primary incisional hernia repair who has developed cellulitis and possible abscess at incision site.  She also has Klebsiella UTI.   She has completed 2 days of broad-spectrum antibiotics and is afebrile with improving leukocytosis.    [name]; [name]  Name of physician (or Provider) Contacted: Clarene Essex, NP  Current antibiotics: Vancomycin + Cefepime  Changes to prescribed antibiotics recommended:  No changes recommended.   No results found for this or any previous visit.  Netta Cedars PharmD 02/07/2021  4:41 AM

## 2021-02-07 NOTE — Evaluation (Signed)
Physical Therapy Evaluation Patient Details Name: Heather [name] MRN: [id number] DOB: 19-May-1935 Today's Date: 02/07/2021   History of Present Illness  86 yo female admitted with UTI, hypotension, sepsis 2* post op cellulitis at incision site. S/P lap chole/hernia repair 01/25/21, CHF, DM, DVT, CAD, RLS  Clinical Impression  On eval, pt was MIn guard assist for mobility. She walked ~200 feet with a RW. Pt tolerated activity well. Will continue to follow.     Follow Up Recommendations Home health PT    Equipment Recommendations  None recommended by PT    Recommendations for Other Services       Precautions / Restrictions Precautions Precautions: Fall Precaution Comments: abdominal incision Restrictions Weight Bearing Restrictions: No      Mobility  Bed Mobility               General bed mobility comments: In recliner    Transfers Overall transfer level: Needs assistance Equipment used: Rolling walker (2 wheeled) Transfers: Sit to/from Stand Sit to Stand: Min guard         General transfer comment: Min guard for safety.  Ambulation/Gait Ambulation/Gait assistance: Min guard Gait Distance (Feet): 200 Feet Assistive device: Rolling walker (2 wheeled) Gait Pattern/deviations: Step-through pattern;Decreased stride length     General Gait Details: Min guard for safety. O2 93% on RA, dyspnea 2/4.  Stairs            Wheelchair Mobility    Modified Rankin (Stroke Patients Only)       Balance Overall balance assessment: Mild deficits observed, not formally tested                                           Pertinent Vitals/Pain Pain Assessment: No/denies pain    Home Living Family/patient expects to be discharged to:: Private residence Living Arrangements: Alone Available Help at Discharge: Family Type of Home: House Home Access: Stairs to enter Entrance Stairs-Rails: None Entrance Stairs-Number of Steps: 1 Home Layout: One  level Home Equipment: Environmental consultant - 2 wheels;Cane - single point;Bedside commode;Shower seat;Adaptive equipment Additional Comments: Lift recliner, Adjustable bed    Prior Function Level of Independence: Independent         Comments: Her daughter will be staying with her at discharge.     Hand Dominance   Dominant Hand: Right    Extremity/Trunk Assessment   Upper Extremity Assessment Upper Extremity Assessment: Defer to OT evaluation    Lower Extremity Assessment Lower Extremity Assessment: Generalized weakness    Cervical / Trunk Assessment Cervical / Trunk Assessment: Kyphotic  Communication   Communication: No difficulties  Cognition Arousal/Alertness: Awake/alert Behavior During Therapy: WFL for tasks assessed/performed Overall Cognitive Status: Within Functional Limits for tasks assessed                                        General Comments      Exercises     Assessment/Plan    PT Assessment Patient needs continued PT services  PT Problem List Decreased strength;Decreased mobility;Decreased activity tolerance;Decreased knowledge of use of DME       PT Treatment Interventions DME instruction;Gait training;Therapeutic exercise;Balance training;Functional mobility training;Therapeutic activities;Patient/family education    PT Goals (Current goals can be found in the Care Plan section)  Acute Rehab PT Goals Patient  Stated Goal: walk- "this is what i need" PT Goal Formulation: With patient Time For Goal Achievement: 02/21/21 Potential to Achieve Goals: Good    Frequency Min 3X/week   Barriers to discharge        Co-evaluation               AM-PAC PT "6 Clicks" Mobility  Outcome Measure Help needed turning from your back to your side while in a flat bed without using bedrails?: A Little Help needed moving from lying on your back to sitting on the side of a flat bed without using bedrails?: A Little Help needed moving to and from  a bed to a chair (including a wheelchair)?: A Little Help needed standing up from a chair using your arms (e.g., wheelchair or bedside chair)?: A Little Help needed to walk in hospital room?: A Little Help needed climbing 3-5 steps with a railing? : A Little 6 Click Score: 18    End of Session   Activity Tolerance: Patient tolerated treatment well Patient left: in chair;with call bell/phone within reach   PT Visit Diagnosis: Muscle weakness (generalized) (M62.81);Difficulty in walking, not elsewhere classified (R26.2)    Time: [id number] PT Time Calculation (min) (ACUTE ONLY): 18 min   Charges:   PT Evaluation $PT Eval Moderate Complexity: 1 Mod             Doreatha Massed, PT Acute Rehabilitation  Office: [phone] Pager: [phone]

## 2021-02-08 ENCOUNTER — Encounter (HOSPITAL_COMMUNITY): Payer: Self-pay | Admitting: Internal Medicine

## 2021-02-08 DIAGNOSIS — A419 Sepsis, unspecified organism: Secondary | ICD-10-CM | POA: Diagnosis not present

## 2021-02-08 DIAGNOSIS — I5033 Acute on chronic diastolic (congestive) heart failure: Secondary | ICD-10-CM | POA: Diagnosis not present

## 2021-02-08 DIAGNOSIS — N179 Acute kidney failure, unspecified: Secondary | ICD-10-CM | POA: Diagnosis not present

## 2021-02-08 DIAGNOSIS — I10 Essential (primary) hypertension: Secondary | ICD-10-CM | POA: Diagnosis not present

## 2021-02-08 LAB — CBC
HCT: 28.4 % — ABNORMAL LOW (ref 36.0–46.0)
Hemoglobin: 9.2 g/dL — ABNORMAL LOW (ref 12.0–15.0)
MCH: 30.2 pg (ref 26.0–34.0)
MCHC: 32.4 g/dL (ref 30.0–36.0)
MCV: 93.1 fL (ref 80.0–100.0)
Platelets: 191 10*3/uL (ref 150–400)
RBC: 3.05 MIL/uL — ABNORMAL LOW (ref 3.87–5.11)
RDW: 13.2 % (ref 11.5–15.5)
WBC: 7.9 10*3/uL (ref 4.0–10.5)
nRBC: 0 % (ref 0.0–0.2)

## 2021-02-08 LAB — COMPREHENSIVE METABOLIC PANEL
ALT: 18 U/L (ref 0–44)
AST: 27 U/L (ref 15–41)
Albumin: 2.6 g/dL — ABNORMAL LOW (ref 3.5–5.0)
Alkaline Phosphatase: 114 U/L (ref 38–126)
Anion gap: 7 (ref 5–15)
BUN: 18 mg/dL (ref 8–23)
CO2: 21 mmol/L — ABNORMAL LOW (ref 22–32)
Calcium: 8.5 mg/dL — ABNORMAL LOW (ref 8.9–10.3)
Chloride: 107 mmol/L (ref 98–111)
Creatinine, Ser: 0.74 mg/dL (ref 0.44–1.00)
GFR, Estimated: >60 mL/min (ref >60)
Glucose, Bld: 91 mg/dL (ref 70–99)
Potassium: 4.9 mmol/L (ref 3.5–5.1)
Sodium: 135 mmol/L (ref 135–145)
Total Bilirubin: 0.7 mg/dL (ref 0.3–1.2)
Total Protein: 5.5 g/dL — ABNORMAL LOW (ref 6.5–8.1)

## 2021-02-08 LAB — BRAIN NATRIURETIC PEPTIDE: B Natriuretic Peptide: 428 pg/mL — ABNORMAL HIGH (ref 0.0–100.0)

## 2021-02-08 MED ORDER — METOPROLOL TARTRATE 5 MG/5ML IV SOLN: 5.0000 mg | Freq: Four times a day (QID) | INTRAVENOUS | Status: DC | PRN

## 2021-02-08 MED ORDER — OXYCODONE HCL 5 MG PO TABS: 5.0000 mg | ORAL_TABLET | Freq: Four times a day (QID) | ORAL | Status: DC | PRN

## 2021-02-08 MED ORDER — PSYLLIUM 95 % PO PACK
1.0000 | PACK | Freq: Two times a day (BID) | ORAL | Status: DC
  Administered 2021-02-09: 1 via ORAL
  Filled 2021-02-08 (×3): qty 1

## 2021-02-08 MED ORDER — LIP MEDEX EX OINT
1.0000 "application " | TOPICAL_OINTMENT | Freq: Two times a day (BID) | CUTANEOUS | Status: DC
  Administered 2021-02-08 – 2021-02-10 (×5): 1 via TOPICAL
  Filled 2021-02-08: qty 7

## 2021-02-08 MED ORDER — FAMOTIDINE 20 MG PO TABS
20.0000 mg | ORAL_TABLET | Freq: Every day | ORAL | Status: DC
  Administered 2021-02-08 – 2021-02-09 (×2): 20 mg via ORAL
  Filled 2021-02-08 (×2): qty 1

## 2021-02-08 MED ORDER — GLUCERNA SHAKE PO LIQD
237.0000 mL | Freq: Two times a day (BID) | ORAL | Status: DC
  Filled 2021-02-08 (×6): qty 237

## 2021-02-08 MED ORDER — METOPROLOL TARTRATE 12.5 MG HALF TABLET: 12.5000 mg | ORAL_TABLET | Freq: Two times a day (BID) | ORAL | Status: DC | PRN

## 2021-02-08 MED ORDER — OXYCODONE HCL 5 MG PO TABS
5.0000 mg | ORAL_TABLET | ORAL | Status: DC | PRN
Start: 2021-02-08 — End: 2021-02-08

## 2021-02-08 MED ORDER — DIPHENHYDRAMINE HCL 25 MG PO CAPS
25.0000 mg | ORAL_CAPSULE | Freq: Every evening | ORAL | Status: DC | PRN
  Administered 2021-02-10: 25 mg via ORAL
  Filled 2021-02-08: qty 1

## 2021-02-08 MED ORDER — FUROSEMIDE 10 MG/ML IJ SOLN
20.0000 mg | Freq: Two times a day (BID) | INTRAMUSCULAR | Status: DC
  Administered 2021-02-08 – 2021-02-09 (×2): 20 mg via INTRAVENOUS
  Filled 2021-02-08 (×2): qty 2

## 2021-02-08 MED ORDER — ONDANSETRON HCL 4 MG/2ML IJ SOLN
4.0000 mg | Freq: Four times a day (QID) | INTRAMUSCULAR | Status: DC | PRN
  Administered 2021-02-08 – 2021-02-10 (×2): 4 mg via INTRAVENOUS
  Filled 2021-02-08 (×2): qty 2

## 2021-02-08 MED ORDER — OXYCODONE HCL 5 MG PO TABS
2.5000 mg | ORAL_TABLET | Freq: Four times a day (QID) | ORAL | Status: DC | PRN
  Administered 2021-02-08: 2.5 mg via ORAL
  Filled 2021-02-08: qty 1

## 2021-02-08 MED ORDER — TRAMADOL HCL 50 MG PO TABS
50.0000 mg | ORAL_TABLET | Freq: Four times a day (QID) | ORAL | Status: DC | PRN
  Filled 2021-02-08: qty 2

## 2021-02-08 MED ORDER — ALUM & MAG HYDROXIDE-SIMETH 200-200-20 MG/5ML PO SUSP: 30.0000 mL | Freq: Four times a day (QID) | ORAL | Status: DC | PRN

## 2021-02-08 MED ORDER — MAGIC MOUTHWASH
15.0000 mL | Freq: Four times a day (QID) | ORAL | Status: DC | PRN
  Filled 2021-02-08: qty 15

## 2021-02-08 MED ORDER — ACETAMINOPHEN 500 MG PO TABS
1000.0000 mg | ORAL_TABLET | Freq: Four times a day (QID) | ORAL | Status: DC
  Administered 2021-02-08 – 2021-02-10 (×9): 1000 mg via ORAL
  Filled 2021-02-08 (×9): qty 2

## 2021-02-08 MED ORDER — PROCHLORPERAZINE EDISYLATE 10 MG/2ML IJ SOLN: 5.0000 mg | INTRAMUSCULAR | Status: DC | PRN

## 2021-02-08 MED ORDER — SODIUM CHLORIDE 0.9 % IV SOLN
8.0000 mg | Freq: Four times a day (QID) | INTRAVENOUS | Status: DC | PRN
  Filled 2021-02-08: qty 4

## 2021-02-08 MED ORDER — PIPERACILLIN-TAZOBACTAM 3.375 G IVPB
3.3750 g | Freq: Three times a day (TID) | INTRAVENOUS | Status: DC
  Administered 2021-02-08 – 2021-02-10 (×7): 3.375 g via INTRAVENOUS
  Filled 2021-02-08 (×7): qty 50

## 2021-02-08 MED ORDER — ENALAPRILAT 1.25 MG/ML IV SOLN
0.6250 mg | Freq: Four times a day (QID) | INTRAVENOUS | Status: DC | PRN
  Filled 2021-02-08: qty 1

## 2021-02-08 MED ORDER — MORPHINE SULFATE (PF) 2 MG/ML IV SOLN
0.5000 mg | INTRAVENOUS | Status: DC | PRN
  Administered 2021-02-08: 1 mg via INTRAVENOUS
  Filled 2021-02-08: qty 1

## 2021-02-08 NOTE — Progress Notes (Signed)
Mobility Specialist - Progress Note    02/08/21 1417  Mobility  Activity Ambulated in hall  Level of Assistance Standby assist, set-up cues, supervision of patient - no hands on  Assistive Device Front wheel walker  Distance Ambulated (ft) 500 ft  Mobility Ambulated with assistance in hallway  Mobility Response Tolerated well  Mobility performed by Mobility specialist  $Mobility charge 1 Mobility    Pt was very pleasant and ambulated ~500 ft in hallway using RW. Pt did report feeling pain at drain site prior to ambulation, but stated "it's always there". Pt did not c/o of pain or dizziness during ambulation, but did experience SOB. Pt took 1 standing rest break to catch breath before returning to room. Pt left in recliner after session with call bell at side and family and NT in room.  [place] Specialist Acute Rehabilitation Services Phone: [phone] 02/08/21, 2:21 PM

## 2021-02-08 NOTE — Progress Notes (Signed)
PROGRESS NOTE  TRINIDY MASTERSON ZOX:[id number] DOB: 02/09/1935 DOA: 02/04/2021 PCP: Mosie Lukes, MD  HPI/Recap of past 10 hours: 86 year old female with past medical history of CAD, DVT, diabetes mellitus, chronic diastolic CHF and recent lap chole admitted on 7/30 for sepsis secondary to cellulitis around incision site.Started on fluids and antibiotics and admitted to the hospitalist service.  Seen by general surgery.  HIDA scan negative.  Interventional radiology placed drain on 8/2.  Patient slowly improving.  Blood cultures pending.  BNP checked and found be mildly elevated 432.  Complains of some soreness at drain site, but no pain or nausea  Assessment/Plan: Sepsis secondary to cellulitis at postop umbilical incision site: Patient met criteria for sepsis on admission given fever and leukocytosis.  Has been on vancomycin and cefepime.  HIDA scan negative.  Status post drain placement by interventional radiology 8/2.  White blood cell count normalized today.  Acute kidney injury: 1.44 on admission.  Responded to IV fluids and now normalized.  Blood pressure initially soft, improving.  On Imdur.  Acute on chronic diastolic heart failure: In response to fluid resuscitation.  Fluids now stopped now that sepsis has been stabilized.  We will start gentle diuresis.  Restless leg syndrome: Continue pramiprexole.  Left posterior vitreous detachment: Patient had noted floaters.  Ophthalmology evaluated patient and found posterior vitreous detachment with recommendation for repeated dilated exam in 1 month.  Code Status: Full code  Family Communication: Updated daughter by phone.  Disposition Plan: IV antibiotics x5 days after drain placement.  Drain at least for 48 hours   Consultants: General surgery Interventional radiology Ophthalmology  Procedures: HIDA scan: Normal Drain placement  Antimicrobials: IV vancomycin and cefepime 7/31-8/3 IV Zosyn 8/3-present  DVT  prophylaxis: Lovenox  Level of care: Med-Surg   Objective: Vitals:   02/07/21 2140 02/08/21 0615  BP: (!) 128/54 (!) 134/58  Pulse: 61 60  Resp: 15 15  Temp: 98.3 F (36.8 C) 97.6 F (36.4 C)  SpO2: 97% 98%    Intake/Output Summary (Last 24 hours) at 02/08/2021 0836 Last data filed at 02/08/2021 0600 Gross per 24 hour  Intake 800 ml  Output 825 ml  Net -25 ml   Filed Weights   02/04/21 2050  Weight: 68.9 kg   Body mass index is 27.8 kg/m.  Exam:  General: Alert and oriented x3, no acute distress Cardiovascular: Regular rate and rhythm, S1-S2 Respiratory: Clear to auscultation bilaterally Abdomen: Soft, nontender, nondistended, positive bowel sounds, drain in place Musculoskeletal: No clubbing or cyanosis or edema Skin: No skin breaks, tears or lesions Psychiatry: Appropriate, no evidence of psychoses Neurology: No focal deficits   Data Reviewed: CBC: Recent Labs  Lab 02/04/21 2130 02/06/21 0355 02/07/21 0401 02/08/21 0419  WBC 12.4* 18.8* 13.1* 7.9  NEUTROABS 11.9*  --   --   --   HGB 11.9* 9.5* 10.1* 9.2*  HCT 36.3 29.4* 31.6* 28.4*  MCV 92.6 93.3 94.6 93.1  PLT 291 222 198 811   Basic Metabolic Panel: Recent Labs  Lab 02/02/21 0501 02/04/21 2130 02/06/21 0355 02/07/21 0401 02/08/21 0419  NA 135 131* 132* 130* 135  K 4.0 4.6 4.8 4.8 4.9  CL 98 99 100 103 107  CO2 26 23 21* 18* 21*  GLUCOSE 118* 118* 93 127* 91  BUN 17 19 25* 19 18  CREATININE 0.91 1.44* 1.46* 1.08* 0.74  CALCIUM 8.4* 8.7* 8.0* 8.1* 8.5*   GFR: Estimated Creatinine Clearance: 47.6 mL/min (by C-G formula based on  SCr of 0.74 mg/dL). Liver Function Tests: Recent Labs  Lab 02/04/21 2130 02/06/21 0355 02/07/21 0401 02/08/21 0419  AST '28 24 25 27  ' ALT '27 20 20 18  ' ALKPHOS 164* 112 138* 114  BILITOT 0.6 0.7 0.9 0.7  PROT 6.6 5.4* 5.6* 5.5*  ALBUMIN 3.1* 2.6* 2.7* 2.6*   No results for input(s): LIPASE, AMYLASE in the last 168 hours. No results for input(s): AMMONIA in  the last 168 hours. Coagulation Profile: Recent Labs  Lab 02/07/21 0401  INR 1.1   Cardiac Enzymes: No results for input(s): CKTOTAL, CKMB, CKMBINDEX, TROPONINI in the last 168 hours. BNP (last 3 results) Recent Labs    08/19/20 1351  PROBNP 428   HbA1C: No results for input(s): HGBA1C in the last 72 hours. CBG: Recent Labs  Lab 02/01/21 1718 02/01/21 2146 02/02/21 0730 02/02/21 1213 02/05/21 0419  GLUCAP 89 116* 106* 114* 133*   Lipid Profile: No results for input(s): CHOL, HDL, LDLCALC, TRIG, CHOLHDL, LDLDIRECT in the last 72 hours. Thyroid Function Tests: No results for input(s): TSH, T4TOTAL, FREET4, T3FREE, THYROIDAB in the last 72 hours. Anemia Panel: No results for input(s): VITAMINB12, FOLATE, FERRITIN, TIBC, IRON, RETICCTPCT in the last 72 hours. Urine analysis:    Component Value Date/Time   COLORURINE YELLOW 02/05/2021 0206   APPEARANCEUR CLOUDY (A) 02/05/2021 0206   LABSPEC <1.005 (L) 02/05/2021 0206   PHURINE 5.0 02/05/2021 0206   GLUCOSEU NEGATIVE 02/05/2021 0206   GLUCOSEU NEGATIVE 09/21/2016 1103   HGBUR NEGATIVE 02/05/2021 0206   BILIRUBINUR NEGATIVE 02/05/2021 0206   BILIRUBINUR neg 11/24/2020 1204   KETONESUR 5 (A) 02/05/2021 0206   PROTEINUR NEGATIVE 02/05/2021 0206   UROBILINOGEN 0.2 11/24/2020 1204   UROBILINOGEN 0.2 09/21/2016 1103   NITRITE NEGATIVE 02/05/2021 0206   LEUKOCYTESUR SMALL (A) 02/05/2021 0206   Sepsis Labs: '@LABRCNTIP' (procalcitonin:4,lacticidven:4)  ) Recent Results (from the past 240 hour(s))  Urine Culture     Status: Abnormal   Collection Time: 01/31/21 12:49 PM   Specimen: Urine, Clean Catch  Result Value Ref Range Status   Specimen Description   Final    URINE, CLEAN CATCH Performed at [facility], [place] [address] [place], [place] [address]    Special Requests   Final    NONE Performed at [facility], [place] [address] [place], [place] [address]    Culture >=100,000  COLONIES/mL KLEBSIELLA OXYTOCA (A)  Final   Report Status 02/02/2021 FINAL  Final   Organism ID, Bacteria KLEBSIELLA OXYTOCA (A)  Final      Susceptibility   Klebsiella oxytoca - MIC*    AMPICILLIN >=32 RESISTANT Resistant     CEFAZOLIN 8 SENSITIVE Sensitive     CEFEPIME <=0.12 SENSITIVE Sensitive     CEFTRIAXONE <=0.25 SENSITIVE Sensitive     CIPROFLOXACIN <=0.25 SENSITIVE Sensitive     GENTAMICIN <=1 SENSITIVE Sensitive     IMIPENEM <=0.25 SENSITIVE Sensitive     NITROFURANTOIN <=16 SENSITIVE Sensitive     TRIMETH/SULFA <=20 SENSITIVE Sensitive     AMPICILLIN/SULBACTAM 16 INTERMEDIATE Intermediate     PIP/TAZO <=4 SENSITIVE Sensitive     * >=100,000 COLONIES/mL KLEBSIELLA OXYTOCA  Culture, blood (routine x 2)     Status: None (Preliminary result)   Collection Time: 02/04/21  9:30 PM   Specimen: BLOOD  Result Value Ref Range Status   Specimen Description   Final    BLOOD SITE NOT SPECIFIED Performed at [facility] [address] [place], [place]  [address]    Special Requests   Final    BOTTLES DRAWN AEROBIC AND ANAEROBIC Blood Culture adequate volume Performed at [facility] [address], [place], [place] [address]    Culture  Setup Time   Final    GRAM POSITIVE RODS ANAEROBIC BOTTLE ONLY CRITICAL RESULT CALLED TO, READ BACK BY AND VERIFIED WITH: m. Higgins, AT 0071 02/07/21 [place] Performed at [facility], [place] [address] [place], [place] [address]    Culture GRAM POSITIVE RODS  Final   Report Status PENDING  Incomplete  Resp Panel by RT-PCR (Flu A&B, Covid) Nasopharyngeal Swab     Status: None   Collection Time: 02/04/21  9:30 PM   Specimen: Nasopharyngeal Swab; Nasopharyngeal(NP) swabs in vial transport medium  Result Value Ref Range Status   SARS Coronavirus 2 by RT PCR NEGATIVE NEGATIVE Final    Comment: (NOTE) SARS-CoV-2 target nucleic acids are NOT DETECTED.  The SARS-CoV-2 RNA is generally detectable  in upper respiratory specimens during the acute phase of infection. The lowest concentration of SARS-CoV-2 viral copies this assay can detect is 138 copies/mL. A negative result does not preclude SARS-Cov-2 infection and should not be used as the sole basis for treatment or other patient management decisions. A negative result may occur with  improper specimen collection/handling, submission of specimen other than nasopharyngeal swab, presence of viral mutation(s) within the areas targeted by this assay, and inadequate number of viral copies(<138 copies/mL). A negative result must be combined with clinical observations, patient history, and epidemiological information. The expected result is Negative.  Fact Sheet for Patients:  EntrepreneurPulse.com.au  Fact Sheet for Healthcare Providers:  IncredibleEmployment.be  This test is no t yet approved or cleared by the [place] FDA and  has been authorized for detection and/or diagnosis of SARS-CoV-2 by FDA under an Emergency Use Authorization (EUA). This EUA will remain  in effect (meaning this test can be used) for the duration of the COVID-19 declaration under Section 564(b)(1) of the Act, 21 U.S.C.section 360bbb-3(b)(1), unless the authorization is terminated  or revoked sooner.       Influenza A by PCR NEGATIVE NEGATIVE Final   Influenza B by PCR NEGATIVE NEGATIVE Final    Comment: (NOTE) The Xpert Xpress SARS-CoV-2/FLU/RSV plus assay is intended as an aid in the diagnosis of influenza from Nasopharyngeal swab specimens and should not be used as a sole basis for treatment. Nasal washings and aspirates are unacceptable for Xpert Xpress SARS-CoV-2/FLU/RSV testing.  Fact Sheet for Patients: EntrepreneurPulse.com.au  Fact Sheet for Healthcare Providers: IncredibleEmployment.be  This test is not yet approved or cleared by the [place] FDA and has  been authorized for detection and/or diagnosis of SARS-CoV-2 by FDA under an Emergency Use Authorization (EUA). This EUA will remain in effect (meaning this test can be used) for the duration of the COVID-19 declaration under Section 564(b)(1) of the Act, 21 U.S.C. section 360bbb-3(b)(1), unless the authorization is terminated or revoked.  Performed at [facility], [place] [address], [place], [place] [address]   Urine Culture     Status: Abnormal   Collection Time: 02/05/21  2:06 AM   Specimen: Urine, Clean Catch  Result Value Ref Range Status   Specimen Description   Final    URINE, CLEAN CATCH Performed at [facility], [place] [address], [place], [place] [address]    Special Requests   Final    NONE Performed at [facility],  [place] [address], [place], [place] [address]    Culture (A)  Final    <10,000 COLONIES/mL INSIGNIFICANT GROWTH Performed at [place] [address], [place], [place] [address]    Report Status 02/06/2021 FINAL  Final  Culture, blood (routine x 2)     Status: None (Preliminary result)   Collection Time: 02/05/21  5:17 AM   Specimen: BLOOD  Result Value Ref Range Status   Specimen Description   Final    BLOOD RIGHT ANTECUBITAL Performed at [place] [address], [place], [place] [address]    Special Requests   Final    BOTTLES DRAWN AEROBIC ONLY Blood Culture adequate volume Performed at [facility] [address] [place], [place] [address]    Culture   Final    NO GROWTH 2 DAYS Performed at [place] [address], [place], [place] [address]    Report Status PENDING  Incomplete  Aerobic/Anaerobic Culture w Gram Stain (surgical/deep wound)     Status: None (Preliminary result)   Collection Time: 02/07/21  1:07 PM   Specimen: Abscess  Result Value Ref Range Status   Specimen Description   Final    ABSCESS RIGHT UPPER  QUADRANT Performed at [place] [address] [place], [place] [address]    Special Requests   Final    NONE Performed at [facility], [place] [address], [place], [place] [address]    Gram Stain   Final    ABUNDANT WBC PRESENT, PREDOMINANTLY PMN FEW GRAM POSITIVE RODS Performed at [facility], [place] [address] [place], [place] [address]    Culture PENDING  Incomplete   Report Status PENDING  Incomplete      Studies: CT IMAGE GUIDED DRAINAGE BY PERCUTANEOUS CATHETER  Result Date: 02/07/2021 INDICATION: 86 year old with a postoperative fluid collection at gallbladder fossa. EXAM: CT-GUIDED DRAIN PLACEMENT AT GALLBLADDER FOSSA MEDICATIONS: Moderate sedation ANESTHESIA/SEDATION: Fentanyl 50 mcg IV; Versed 1.0 mg IV Moderate Sedation Time:  20 minutes The patient was continuously monitored during the procedure by the interventional radiology nurse under my direct supervision. COMPLICATIONS: None immediate. PROCEDURE: Informed written consent was obtained from the patient after a thorough discussion of the procedural risks, benefits and alternatives. All questions were addressed. A timeout was performed prior to the initiation of the procedure. Patient was placed supine on the CT scanner. Images through the abdomen were obtained. The air-fluid collection in the gallbladder fossa was identified and targeted. The right upper abdomen was prepped with chlorhexidine and sterile field was created. Maximal barrier sterile technique was utilized including caps, mask, sterile gowns, sterile gloves, sterile drape, hand hygiene and skin antiseptic. Skin was anesthetized using 1% lidocaine. Small incision was made. Using CT guidance, an 18 gauge trocar needle was directed into the gallbladder fossa fluid collection. Pink purulent fluid was aspirated. A superstiff Amplatz wire was advanced into the collection and the tract was dilated to accommodate a 10 [place]  multipurpose drain. Drain was advanced into the collection and follow up CT images were obtained. Approximately 15 mL of purulent fluid was aspirated. Fluid was sent for culture. Catheter was sutured to skin and attached to a suction bulb. FINDINGS: Air-fluid collection in the gallbladder fossa. Collection was decompressed following drain placement. 15 mL of pink opaque fluid was removed. IMPRESSION: CT-guided placement of a drainage catheter within the gallbladder fossa fluid collection. Electronically Signed   By: Scherrie Gerlach.D.  On: 02/07/2021 15:53    Scheduled Meds:  acetaminophen  1,000 mg Oral Q6H   ascorbic acid  500 mg Oral Daily   aspirin EC  81 mg Oral q AM   cholecalciferol  2,000 Units Oral Daily   enoxaparin (LOVENOX) injection  40 mg Subcutaneous Q24H   famotidine  20 mg Oral QHS   feeding supplement (GLUCERNA SHAKE)  237 mL Oral BID BM   isosorbide mononitrate  90 mg Oral QPM   lip balm  1 application Topical BID   magnesium oxide  400 mg Oral Daily   pantoprazole  40 mg Oral QHS   potassium chloride SA  10 mEq Oral QPM   pramipexole  0.25 mg Oral BID   psyllium  1 packet Oral BID   simvastatin  20 mg Oral QPM   sodium chloride flush  5 mL Intracatheter Q8H   zinc sulfate  220 mg Oral Daily    Continuous Infusions:  ceFEPime (MAXIPIME) IV 2 g (02/07/21 2119)   ondansetron (ZOFRAN) IV     vancomycin 1,000 mg (02/07/21 2232)     LOS: 3 days     Annita Brod, MD [facility]   02/08/2021, 8:36 AM

## 2021-02-08 NOTE — Progress Notes (Signed)
Referring Physician(s): Gross,S  Supervising Physician: Aletta Edouard  Patient Status:  WL IP  Chief Complaint: Right upper abdominal pain, gallbladder fossa fluid collection   Subjective: Patient currently eating breakfast; states that she feels better since undergoing drain procedure yesterday but still has some discomfort at drain insertion site; denies nausea or vomiting   Allergies: Baclofen and Nitrofurantoin  Medications: Prior to Admission medications   Medication Sig Start Date End Date Taking? Authorizing Provider  acetaminophen (TYLENOL) 500 MG tablet Take 1,000 mg by mouth every 6 (six) hours as needed for moderate pain.   Yes [provider]  amLODipine (NORVASC) 10 MG tablet TAKE 1 TABLET EVERY DAY 02/01/21  Yes Mosie Lukes, MD  Ascorbic Acid (VITAMIN C) 500 MG CHEW Chew 500 mg by mouth daily.   Yes [provider]  aspirin EC 81 MG tablet Take 1 tablet (81 mg total) by mouth daily. Patient taking differently: Take 81 mg by mouth in the morning. 05/04/15  Yes [name], MD  Blood Glucose Monitoring Suppl (TRUE METRIX AIR GLUCOSE METER) w/Device KIT USE TO CHECK BLOOD SUGAR ONCE DAILY AND AS NEEDED.  DX CODE E11.9 07/27/20  Yes Mosie Lukes, MD  Cholecalciferol (VITAMIN D) 50 MCG (2000 UT) CAPS Take 2,000 Units by mouth daily.    Yes [provider]  diclofenac Sodium (VOLTAREN) 1 % GEL Apply 1 application topically 4 (four) times daily as needed (hip pain).   Yes [provider]  famotidine (PEPCID) 20 MG tablet Take 20 mg by mouth at bedtime as needed for heartburn or indigestion.   Yes [provider]  furosemide (LASIX) 20 MG tablet Take 1 tablet (20 mg total) by mouth daily as needed for edema (weight gain greater than 3 pounds in 1 day or 5 pounds in 1 week). 02/01/21 05/02/21 Yes [place] Eric J, DO  glucose blood (TRUE METRIX BLOOD GLUCOSE TEST) test strip USE TO CHECK BLOOD SUGAR ONCE A DAY OR AS  NEEDED.  DX CODE: E11.9 07/27/20  Yes Mosie Lukes, MD  isosorbide mononitrate (IMDUR) 60 MG 24 hr tablet Take 1.5 tablets (90 mg total) by mouth daily. Patient taking differently: Take 90 mg by mouth every evening. 12/14/20 12/14/21 Yes Weaver, Scott T, PA-C  ketorolac (TORADOL) 10 MG tablet Take 1 tablet (10 mg total) by mouth every 6 (six) hours as needed. Patient taking differently: Take 10 mg by mouth every 6 (six) hours as needed (pain). 10/30/20  Yes Jaquita Folds, MD  magnesium oxide (MAG-OX) 400 MG tablet Take 400 mg by mouth at bedtime.   Yes [provider]  metoprolol succinate (TOPROL-XL) 50 MG 24 hr tablet Take 1.5 tablets (75 mg total) by mouth at bedtime. Take with or immediately following a meal. 12/14/20 12/14/21 Yes Weaver, Scott T, PA-C  nitroGLYCERIN (NITROSTAT) 0.4 MG SL tablet Place 1 tablet (0.4 mg total) under the tongue every 5 (five) minutes as needed for chest pain. Patient taking differently: Place 0.4 mg under the tongue every 5 (five) minutes x 3 doses as needed for chest pain. 08/29/20 08/29/21 Yes Weaver, Scott T, PA-C  omeprazole (PRILOSEC) 20 MG capsule TAKE 1 CAPSULE EVERY DAY AS NEEDED Patient taking differently: Take 20 mg by mouth in the morning. 01/02/21  Yes Mosie Lukes, MD  ondansetron (ZOFRAN ODT) 4 MG disintegrating tablet Take 1 tablet (4 mg total) by mouth every 6 (six) hours as needed for nausea or vomiting. 12/15/19  Yes Armbruster, Remo Lipps  P, MD  potassium chloride SA (KLOR-CON) 20 MEQ tablet Take 0.5 tablets (10 mEq total) by mouth daily. Patient taking differently: Take 10 mEq by mouth every evening. 09/14/20  Yes Weaver, Scott T, PA-C  pramipexole (MIRAPEX) 0.25 MG tablet Take 1 tablet (0.25 mg total) by mouth 2 (two) times daily. 09/26/20  Yes Mosie Lukes, MD  simvastatin (ZOCOR) 20 MG tablet TAKE 1 TABLET AT BEDTIME Patient taking differently: Take 20 mg by mouth every evening. 09/01/20  Yes Weaver, Scott T, PA-C   sulfamethoxazole-trimethoprim (BACTRIM DS) 800-160 MG tablet Take 1 tablet by mouth 2 (two) times daily. 02/03/21  Yes [provider]  traMADol (ULTRAM) 50 MG tablet Take 1 tablet (50 mg total) by mouth every 6 (six) hours as needed for severe pain. 01/26/21  Yes Michael Boston, MD  TRUEplus Lancets 33G MISC USE TO CHECK BLOOD SUGAR ONCE DAILY AND AS NEEDED.  DX CODE: E11.9 07/27/20  Yes Mosie Lukes, MD  Zinc 50 MG TABS Take 50 mg by mouth daily.   Yes [provider]  HYDROcodone-acetaminophen (NORCO/VICODIN) 5-325 MG tablet Take 1 tablet by mouth every 6 (six) hours as needed for moderate pain. 02/03/21 02/08/21  [provider]  mirabegron ER (MYRBETRIQ) 25 MG TB24 tablet Take 1 tablet (25 mg total) by mouth daily. 01/23/21   Jaquita Folds, MD     Vital Signs: BP (!) 134/58 (BP Location: Right Arm)   Pulse 60   Temp 97.6 F (36.4 C) (Oral)   Resp 15   Ht _0  (1.575 m)   Wt 152 lb (68.9 kg)   LMP  (LMP Unknown)   SpO2 98%   BMI 27.80 kg/m   Physical Exam awake, alert.  Right upper quadrant drain intact, insertion site mild to moderately tender, no obvious leaking in region, output 25 cc serosanguineous appearing fluid, drain flushed earlier today by nursing  Imaging: CT HEAD WO CONTRAST  Result Date: 02/05/2021 CLINICAL DATA:  Transient ischemic attack (TIA). Additional history provided: Patient reports "floaters" in eyes. EXAM: CT HEAD WITHOUT CONTRAST TECHNIQUE: Contiguous axial images were obtained from the base of the skull through the vertex without intravenous contrast. COMPARISON:  Brain MRI 08/13/2017.  Head CT 04/01/2013. FINDINGS: Brain: Mild generalized parenchymal atrophy. Mild-to-moderate patchy and ill-defined hypoattenuation within the cerebral white matter, nonspecific but compatible with chronic small vessel ischemic disease. Bilateral basal ganglia mineralization. There is no acute intracranial hemorrhage. No demarcated cortical  infarct. No extra-axial fluid collection. No evidence of an intracranial mass. No midline shift. Vascular: No hyperdense vessel.  Atherosclerotic calcifications Skull: Normal. Negative for fracture or focal lesion. Sinuses/Orbits: Visualized orbits show no acute finding. No significant paranasal sinus disease at the imaged levels. IMPRESSION: No evidence of acute intracranial abnormality. Mild-to-moderate chronic small vessel ischemic changes within the cerebral white matter, progressed from the head CT of 04/01/2013. Mild generalized parenchymal atrophy. Electronically Signed   By: Kellie Simmering DO   On: 02/05/2021 13:18   CT ABDOMEN PELVIS W CONTRAST  Result Date: 02/04/2021 CLINICAL DATA:  Abdominal pain. Gallbladder inter knee a surgery 720, recent hospital discharge. Drainage from incision and fever. EXAM: CT ABDOMEN AND PELVIS WITH CONTRAST TECHNIQUE: Multidetector CT imaging of the abdomen and pelvis was performed using the standard protocol following bolus administration of intravenous contrast. CONTRAST:  46m OMNIPAQUE IOHEXOL 350 MG/ML SOLN COMPARISON:  Postoperative CT 01/28/2021 FINDINGS: Lower chest: Diminished pleural effusions. Improving basilar atelectasis, more so on the right. Stable moderate-sized hiatal hernia.  Coronary artery calcifications. Hepatobiliary: Again seen low-density adjacent to the falciform ligament. No intrahepatic fluid collection. Post recent cholecystectomy. Increased size of fluid collection in the gallbladder fossa currently measuring 4 cm, previously 3.3 cm. Collection contains heterogeneous fluid and air, with small amount of fluid and air tracking superficial to the anterior liver. Minimal periportal edema without biliary dilatation. No common bile duct dilatation. Pancreas: No ductal dilatation or inflammation. Spleen: Normal in size without focal abnormality. Adrenals/Urinary Tract: Normal adrenal glands. No hydronephrosis or renal calculi. Stable cysts in the right  kidney. Lobulated renal contours again seen. Urinary bladder is partially distended. Focus of air in the urinary bladder likely related to prior Foley catheter. Equivocal bladder wall thickening. Stomach/Bowel: Moderate hiatal hernia, similar to prior. Decompressed stomach. There is no small bowel obstruction or inflammation. High-density barium within the colon from prior esophagram. Normal appendix. Left colonic diverticulosis without diverticulitis. No colonic inflammation. Vascular/Lymphatic: Moderate aortic atherosclerosis. Patent portal vein. No acute vascular findings. No enlarged lymph nodes in the abdomen or pelvis Reproductive: Status post hysterectomy. No adnexal masses. Other: Trace air in the cholecystectomy bed that tracks anterior in peripheral to the liver. Trace perihepatic fluid. Gallbladder fossa fluid collection as described above. No other focal fluid collection. No significant ascites/free fluid. There is postsurgical change of the anterior abdominal wall. There is irregularity at the skin surface with small foci of gas in the subcutaneous tissues but no drainable collection. Expected stranding of the subcutaneous tissues. Minimal stranding in the subjacent omental fat. Similar appearance of fat containing hernia just above postsurgical change, hernia neck spanning 4.4 cm transverse, series 2, image 32. Minimal air in the anterior abdominal wall which is improving from prior exam. Musculoskeletal: Stable degenerative change in the spine. No acute findings. IMPRESSION: 1. Post recent cholecystectomy. Increased size of fluid collection in the gallbladder fossa currently measuring 4 cm, previously 3.3 cm. Collection contains heterogeneous fluid and air, with small amount of fluid and air tracking superficial to the anterior liver. Differential considerations include in postoperative seroma, +/- infection, or biloma. 2. Postsurgical change of the anterior abdominal wall with irregular skin surface  and small foci of gas in the subcutaneous tissues but no drainable collection. 3. Similar appearance of fat containing hernia just above postsurgical change. 4. Equivocal bladder wall thickening. 5. Colonic diverticulosis without diverticulitis. 6. Diminished pleural effusions and basilar atelectasis. 7. Moderate hiatal hernia. Aortic Atherosclerosis (ICD10-I70.0). Electronically Signed   By: Keith Rake M.D.   On: 02/04/2021 23:56   DG Chest Portable 1 View  Result Date: 02/04/2021 CLINICAL DATA:  Shortness of breath. EXAM: PORTABLE CHEST 1 VIEW COMPARISON:  Radiograph and chest CT 01/28/2021. FINDINGS: Lower lung volumes from prior exam. Patient is rotated. Grossly stable heart size and mediastinal contours. Increasing right basilar opacity. Small pleural effusions. No pulmonary edema. No pneumothorax. IMPRESSION: 1. Lower lung volumes from prior exam. Increasing right basilar opacity, favor atelectasis over pneumonia. 2. Small pleural effusions. Electronically Signed   By: Keith Rake M.D.   On: 02/04/2021 21:44   CT IMAGE GUIDED DRAINAGE BY PERCUTANEOUS CATHETER  Result Date: 02/07/2021 INDICATION: 86 year old with a postoperative fluid collection at gallbladder fossa. EXAM: CT-GUIDED DRAIN PLACEMENT AT GALLBLADDER FOSSA MEDICATIONS: Moderate sedation ANESTHESIA/SEDATION: Fentanyl 50 mcg IV; Versed 1.0 mg IV Moderate Sedation Time:  20 minutes The patient was continuously monitored during the procedure by the interventional radiology nurse under my direct supervision. COMPLICATIONS: None immediate. PROCEDURE: Informed written consent was obtained from the patient after a thorough  discussion of the procedural risks, benefits and alternatives. All questions were addressed. A timeout was performed prior to the initiation of the procedure. Patient was placed supine on the CT scanner. Images through the abdomen were obtained. The air-fluid collection in the gallbladder fossa was identified and  targeted. The right upper abdomen was prepped with chlorhexidine and sterile field was created. Maximal barrier sterile technique was utilized including caps, mask, sterile gowns, sterile gloves, sterile drape, hand hygiene and skin antiseptic. Skin was anesthetized using 1% lidocaine. Small incision was made. Using CT guidance, an 18 gauge trocar needle was directed into the gallbladder fossa fluid collection. Pink purulent fluid was aspirated. A superstiff Amplatz wire was advanced into the collection and the tract was dilated to accommodate a 10 [place] multipurpose drain. Drain was advanced into the collection and follow up CT images were obtained. Approximately 15 mL of purulent fluid was aspirated. Fluid was sent for culture. Catheter was sutured to skin and attached to a suction bulb. FINDINGS: Air-fluid collection in the gallbladder fossa. Collection was decompressed following drain placement. 15 mL of pink opaque fluid was removed. IMPRESSION: CT-guided placement of a drainage catheter within the gallbladder fossa fluid collection. Electronically Signed   By: Markus Daft M.D.   On: 02/07/2021 15:53   NM HEPATOBILIARY LEAK (POST-SURGICAL)  Result Date: 02/06/2021 CLINICAL DATA:  Upper abdominal pain. Recent cholecystectomy (01/25/2021). EXAM: NUCLEAR MEDICINE HEPATOBILIARY IMAGING TECHNIQUE: Sequential images of the abdomen were obtained out to 60 minutes following intravenous administration of radiopharmaceutical. RADIOPHARMACEUTICALS:  5.5 mCi Tc-49m Choletec IV COMPARISON:  Abdominal CT 02/04/2021 FINDINGS: There is prompt uptake and biliary excretion of activity by the liver. There is normal passage of activity into the small bowel. There is no focal retention of activity within the cholecystectomy bed or spillage of activity into the peritoneal cavity. IMPRESSION: Normal hepatobiliary scan status post cholecystectomy. No evidence of bile leak. Electronically Signed   By: WRichardean SaleM.D.   On:  02/06/2021 15:10    Labs:  CBC: Recent Labs    02/04/21 2130 02/06/21 0355 02/07/21 0401 02/08/21 0419  WBC 12.4* 18.8* 13.1* 7.9  HGB 11.9* 9.5* 10.1* 9.2*  HCT 36.3 29.4* 31.6* 28.4*  PLT 291 222 198 191    COAGS: Recent Labs    02/07/21 0401  INR 1.1  APTT 32    BMP: Recent Labs    08/19/20 1351 08/26/20 2159 02/04/21 2130 02/06/21 0355 02/07/21 0401 02/08/21 0419  NA 140   < > 131* 132* 130* 135  K 4.8   < > 4.6 4.8 4.8 4.9  CL 101   < > 99 100 103 107  CO2 22   < > 23 21* 18* 21*  GLUCOSE 130*   < > 118* 93 127* 91  BUN 22   < > 19 25* 19 18  CALCIUM 9.5   < > 8.7* 8.0* 8.1* 8.5*  CREATININE 1.04*   < > 1.44* 1.46* 1.08* 0.74  GFRNONAA 50*   < > 36* 35* 51* >60  GFRAA 57*  --   --   --   --   --    < > = values in this interval not displayed.    LIVER FUNCTION TESTS: Recent Labs    02/04/21 2130 02/06/21 0355 02/07/21 0401 02/08/21 0419  BILITOT 0.6 0.7 0.9 0.7  AST _0 ALT _1 ALKPHOS 164* 112 138* 114  PROT 6.6 5.4* 5.6* 5.5*  ALBUMIN 3.1* 2.6* 2.7* 2.6*    Assessment and Plan: Patient status post laparoscopic cholecystectomy and primary incisional hernia repair on 01/25/2021; now with postop gallbladder fossa fluid collection, nuclear medicine study negative for bile leak, status post drain placement on 8/2; drain output 25 cc serosanguineous fluid, afebrile, WBC normal, hemoglobin 9.2 down slightly from 10.1, creatinine normal, drain fluid cultures pending, blood cultures negative to date; continue current treatment, lab and drain output monitoring; additional plans as per [facility].   Electronically Signed: D. Rowe Robert, PA-C 02/08/2021, 9:46 AM   I spent a total of 15 Minutes at the the patient's bedside AND on the patient's hospital floor or unit, greater than 50% of which was counseling/coordinating care for gallbladder fossa fluid collection drain    Patient ID: Heather [name], female   DOB: 1935-06-24, 86 y.o.   MRN:  [id number]

## 2021-02-08 NOTE — Progress Notes (Signed)
Heather [name] [id number] [birth year]  CARE TEAM:  PCP: Mosie Lukes, MD  Outpatient Care Team: Patient Care Team: Mosie Lukes, MD as PCP - General (Family Medicine) [name], MD as PCP - Cardiology (Cardiology) Idelle Leech, [place] (Optometry) Janyth Contes, MD as Consulting Physician (Obstetrics and Gynecology) Trula Slade, DPM as Consulting Physician (Podiatry) Sharmon Revere as Physician Assistant (Cardiology) [name], PharmD (Pharmacist) Michael Boston, MD as Consulting Physician (General Surgery) Jaquita Folds, MD as Consulting Physician (Gynecology) Armbruster, Carlota Raspberry, MD as Consulting Physician (Gastroenterology)  Inpatient Treatment Team: Treatment Team: Attending Provider: Annita Brod, MD; Consulting Physician: Michael Boston, MD; Rounding Team: Dorthy Cooler Radiology, MD; Rounding Team: Redmond Baseman, MD; Registered Nurse: Donia Ast, RN; Utilization Review: Hetty Ely, RN   Problem List:   Principal Problem:   Sepsis [facility]) Active Problems:   Essential hypertension   Coronary atherosclerosis   DM (diabetes mellitus) ([place])   Staphylococcus aureus carrier   RLS (restless legs syndrome)   Chronic calculous cholecystitis s/p lap cholecystectomy 01/25/2021   Incarcerated incisional hernia s/p primary repair 01/25/2021   Intra-abdominal fluid collection   UTI (urinary tract infection)   AKI (acute kidney injury) ([place])   Chronic diastolic CHF (congestive heart failure) ([place])      01/25/2021  POST-OPERATIVE DIAGNOSIS:    Chronic Calculus cholecystitis Incarcerated incisional hernias   PROCEDURE:  Laparoscopic cholecystectomy with intraoperative cholangiogram (CPT code [phone]) Reduction and primary repair of incarcerated incisional hernias   SURGEON:  [name], MD, FACS.  OR FINDINGS:  Dilated boggy gallbladder with thickening consistent with chronic cholecystitis.  Very  narrow cystic duct with stricturing suspicious for chronic partial cystic duct outlet obstruction.  Cholangiogram with dilated intra and extrahepatic ducts most likely consistent with advanced age.  No evidence of choledocholithiasis nor leak.   Liver: normal no needle biopsy done   Supraumbilical 7 x 3 cm region of incisional hernia incarcerated with omentum.  Largest periumbilical.  Reduction debridement and primary repair done.  No mesh repair given the fact cholecystectomy was done at the same time. SURGICAL PATHOLOGY  CASE: [id number]  PATIENT: Heather [name]  Surgical Pathology Report      Clinical History: Symptomatic biliary colic, probable chronic  cholecystitis, incisional hernia (jmc)    FINAL MICROSCOPIC DIAGNOSIS:   A. GALLBLADDER, CHOLECYSTECTOMY:  -  Chronic cholecystitis with cholelithiasis   [name] DESCRIPTION:   Specimen: Gallbladder, received fresh  Size/?Intact: 12.2 x 4 x 2.6 cm, intact  Serosal surface: Pink green with few scattered fatty adhesions  Mucosa/Wall: The mucosa varies from light green to red-green, and  smooth, soft.  The wall averages 0.2 cm thick.  Contents: There are multiple black roughened choleliths ranging from  minute fragments to 1.2 cm.  Cystic duct: Patent  Block Summary:  Representative sections in 1 block.   SW 01/26/2021     Final Diagnosis performed by Thressa Sheller, MD.   Electronically signed  01/27/2021  Technical component performed at [facility], [place]  [address], [place], [place] [address].   Professional component performed at [employer]. [facility],  [place] [address], [place], [place] [address].   Immunohistochemistry Technical component (if applicable) was performed  at [facility]. [address], [place],  [place], [place] [address].   IMMUNOHISTOCHEMISTRY DISCLAIMER (if applicable):  Some of these immunohistochemical stains may have been developed and the  performance  characteristics determine by [facility].  Some  may not have been cleared or approved by the U.S. Food and Drug  Administration. The FDA has determined that such clearance or approval  is not necessary. This test is used for clinical purposes. It should not  be regarded as investigational or for research. This laboratory is  certified under the [place]  (CLIA-88) as qualified to perform high complexity clinical laboratory  testing.  The controls stained appropriately   Assessment  Slowly improving urinary tract infection postop gallbladder fossa fluid collection  Skin separation at incision w/o cellulitis no definite bile leak  [facility] Stay = 3 days)  Plan:  IV ABx x5 days after drain placement = d/c 8/7 - consider simplifying to Zosyn only w Kleb UTI & no MRSA.  Possibly Augmentin PO at d/c if sooner than 5d  Keep drain ~48 hrs - maybe d/c if low output & no bile  Pack wound once a day.  Can switch to do ribbon Nu Gauze to avoid over packing.    Pain control - still complains although notes it is better except at drain site - make tylenol QID w flexeril & tramadol PRN breakthrough  Physical and Occupational Therapy as tolerated her elderly deconditioned state.  She is up in chair & did walk  Some diabetes and hyperglycemia last admission but seems better now.  She came in somewhat dehydrated with elevated creatinine but that is normalizing.  Some mild hyponatremia.  Defer to medicine but most likely due to fluid resuscitation.  Needs nutrition.  Heart solid diet.  Supplemental shakes with her being malnourished.  Continue H2 blockers for chronic GERD.  Maalox for breakthrough heartburn  Hypertension control  VTE prophylaxis- SCDs, etc   Disposition:  Disposition:  The patient is from: Home  Anticipate discharge to:  Home with Home Health  Anticipated Date of Discharge is:  August 4,2022  Barriers to  discharge:  Pending Clinical improvement (more likely than not)  Patient currently is NOT MEDICALLY STABLE for discharge from the hospital from a surgery standpoint.      25 minutes spent in review, evaluation, examination, counseling, and coordination of care.   I have reviewed this patient's available data, including medical history, events of note, physical examination and test results as part of my evaluation.  A significant portion of that time was spent in counseling.  Care during the described time interval was provided by me.  02/08/2021    Subjective: (Chief complaint)  Drain placed - little output Slept all night - best night Up in chair Walked in hallway w help Tol PO - not much appetite  Objective:  Vital signs:  Vitals:   02/07/21 1337 02/07/21 2032 02/07/21 2140 02/08/21 0615  BP: (!) 108/52  (!) 128/54 (!) 134/58  Pulse: (!) 58  61 60  Resp: '14 14 15 15  '$ Temp: (!) 97.5 F (36.4 C)  98.3 F (36.8 C) 97.6 F (36.4 C)  TempSrc: Oral  Oral Oral  SpO2: 95%  97% 98%  Weight:      Height:        Last BM Date: 02/07/21  Intake/Output   Yesterday:  08/02 0701 - 08/03 0700 In: 800 [P.O.:500; IV Piggyback:300] Out: 825 [Urine:800; Drains:25] This shift:  No intake/output data recorded.  Bowel function:  Flatus: YES  BM:  No  Drain: Serous   Physical Exam:  General: Pt awakens to be alert in no acute distress.  Calm/relaxed. Eyes: PERRL, normal EOM.  Sclera clear.  No icterus Neuro: CN II-XII intact w/o focal sensory/motor deficits. Lymph: No head/neck/groin lymphadenopathy Psych:  No delerium/psychosis/paranoia.  Oriented x 4 HENT: Normocephalic, Mucus membranes moist.  No thrush Neck: Supple, No tracheal deviation.  No obvious thyromegaly Chest: No pain to chest wall compression.  Good respiratory excursion.  No audible wheezing CV:  Pulses intact.  Regular rhythm.  No major extremity edema MS: Normal AROM mjr joints.  No obvious  deformity  Abdomen: Soft.  Nondistended.  Tenderness at right upper quadrant minimal & mainly at drain site.  Midline incision closed with 15 mm separation that probes 2cm deep into the subcutaneous tissues.  Fair granulation but no dehiscence or to peritonitis..  Dressing clean dry and intact.  No evidence of peritonitis.  No incarcerated hernias.  Ext:   No deformity.  No mjr edema.  No cyanosis Skin: No petechiae / purpurea.  No major sores.  Warm and dry    Results:   Cultures: Recent Results (from the past 720 hour(s))  SARS CORONAVIRUS 2 (TAT 6-24 HRS) Nasopharyngeal Nasopharyngeal Swab     Status: None   Collection Time: 01/24/21  2:02 PM   Specimen: Nasopharyngeal Swab  Result Value Ref Range Status   SARS Coronavirus 2 NEGATIVE NEGATIVE Final    Comment: (NOTE) SARS-CoV-2 target nucleic acids are NOT DETECTED.  The SARS-CoV-2 RNA is generally detectable in upper and lower respiratory specimens during the acute phase of infection. Negative results do not preclude SARS-CoV-2 infection, do not rule out co-infections with other pathogens, and should not be used as the sole basis for treatment or other patient management decisions. Negative results must be combined with clinical observations, patient history, and epidemiological information. The expected result is Negative.  Fact Sheet for Patients: SugarRoll.be  Fact Sheet for Healthcare Providers: https://www.woods-mathews.com/  This test is not yet approved or cleared by the [place] FDA and  has been authorized for detection and/or diagnosis of SARS-CoV-2 by FDA under an Emergency Use Authorization (EUA). This EUA will remain  in effect (meaning this test can be used) for the duration of the COVID-19 declaration under Se ction 564(b)(1) of the Act, 21 U.S.C. section 360bbb-3(b)(1), unless the authorization is terminated or revoked sooner.  Performed at [facility], [place] [address], [place], [place] [address]   Resp Panel by RT-PCR (Flu A&B, Covid) Nasopharyngeal Swab     Status: None   Collection Time: 01/28/21  7:23 PM   Specimen: Nasopharyngeal Swab; Nasopharyngeal(NP) swabs in vial transport medium  Result Value Ref Range Status   SARS Coronavirus 2 by RT PCR NEGATIVE NEGATIVE Final    Comment: (NOTE) SARS-CoV-2 target nucleic acids are NOT DETECTED.  The SARS-CoV-2 RNA is generally detectable in upper respiratory specimens during the acute phase of infection. The lowest concentration of SARS-CoV-2 viral copies this assay can detect is 138 copies/mL. A negative result does not preclude SARS-Cov-2 infection and should not be used as the sole basis for treatment or other patient management decisions. A negative result may occur with  improper specimen collection/handling, submission of specimen other than nasopharyngeal swab, presence of viral mutation(s) within the areas targeted by this assay, and inadequate number of viral copies(<138 copies/mL). A negative result must be combined with clinical observations, patient history, and epidemiological information. The expected result is Negative.  Fact Sheet for Patients:  EntrepreneurPulse.com.au  Fact Sheet for Healthcare Providers:  IncredibleEmployment.be  This test is no t yet approved or cleared by the  [place] Architectural technologist and  has been authorized for detection and/or diagnosis of SARS-CoV-2 by FDA under an Print production planner (EUA). This EUA will remain  in effect (meaning this test can be used) for the duration of the COVID-19 declaration under Section 564(b)(1) of the Act, 21 U.S.C.section 360bbb-3(b)(1), unless the authorization is terminated  or revoked sooner.       Influenza A by PCR NEGATIVE NEGATIVE Final   Influenza B by PCR NEGATIVE NEGATIVE Final    Comment: (NOTE) The Xpert Xpress SARS-CoV-2/FLU/RSV plus assay is intended  as an aid in the diagnosis of influenza from Nasopharyngeal swab specimens and should not be used as a sole basis for treatment. Nasal washings and aspirates are unacceptable for Xpert Xpress SARS-CoV-2/FLU/RSV testing.  Fact Sheet for Patients: EntrepreneurPulse.com.au  Fact Sheet for Healthcare Providers: IncredibleEmployment.be  This test is not yet approved or cleared by the [place] FDA and has been authorized for detection and/or diagnosis of SARS-CoV-2 by FDA under an Emergency Use Authorization (EUA). This EUA will remain in effect (meaning this test can be used) for the duration of the COVID-19 declaration under Section 564(b)(1) of the Act, 21 U.S.C. section 360bbb-3(b)(1), unless the authorization is terminated or revoked.  Performed at [facility], [place] [address], [place], [place] [address]   Urine Culture     Status: Abnormal   Collection Time: 01/31/21 12:49 PM   Specimen: Urine, Clean Catch  Result Value Ref Range Status   Specimen Description   Final    URINE, CLEAN CATCH Performed at [facility], [place] [address], [place], [place] [address]    Special Requests   Final    NONE Performed at [facility], [place] [address], [place], [place] [address]    Culture >=100,000 COLONIES/mL KLEBSIELLA OXYTOCA (A)  Final   Report Status 02/02/2021 FINAL  Final   Organism ID, Bacteria KLEBSIELLA OXYTOCA (A)  Final      Susceptibility   Klebsiella oxytoca - MIC*    AMPICILLIN >=32 RESISTANT Resistant     CEFAZOLIN 8 SENSITIVE Sensitive     CEFEPIME <=0.12 SENSITIVE Sensitive     CEFTRIAXONE <=0.25 SENSITIVE Sensitive     CIPROFLOXACIN <=0.25 SENSITIVE Sensitive     GENTAMICIN <=1 SENSITIVE Sensitive     IMIPENEM <=0.25 SENSITIVE Sensitive     NITROFURANTOIN <=16 SENSITIVE Sensitive     TRIMETH/SULFA <=20 SENSITIVE Sensitive     AMPICILLIN/SULBACTAM 16 INTERMEDIATE  Intermediate     PIP/TAZO <=4 SENSITIVE Sensitive     * >=100,000 COLONIES/mL KLEBSIELLA OXYTOCA  Culture, blood (routine x 2)     Status: None (Preliminary result)   Collection Time: 02/04/21  9:30 PM   Specimen: BLOOD  Result Value Ref Range Status   Specimen Description   Final    BLOOD SITE NOT SPECIFIED Performed at [place] [address], [place], [place] [address]    Special Requests   Final    BOTTLES DRAWN AEROBIC AND ANAEROBIC Blood Culture adequate volume Performed at [facility] [address] [place], [place] [address]    Culture  Setup Time   Final    GRAM POSITIVE RODS ANAEROBIC BOTTLE ONLY CRITICAL RESULT CALLED TO, READ BACK BY AND VERIFIED WITH: m. [place], AT [id number] 02/07/21 [place] Performed at [facility], [place] [address], [place], [place] [address]    Culture GRAM POSITIVE RODS  Final   Report Status PENDING  Incomplete  Resp Panel by RT-PCR (Flu A&B, Covid) Nasopharyngeal Swab     Status: None   Collection Time: 02/04/21  9:30 PM   Specimen: Nasopharyngeal Swab; Nasopharyngeal(NP) swabs in vial transport medium  Result Value Ref Range Status   SARS Coronavirus 2 by RT PCR NEGATIVE NEGATIVE Final    Comment: (NOTE) SARS-CoV-2 target nucleic acids are NOT DETECTED.  The SARS-CoV-2 RNA is generally detectable in upper respiratory specimens during the acute phase of infection. The lowest concentration of SARS-CoV-2 viral copies this assay can detect is 138 copies/mL. A negative result does not preclude SARS-Cov-2 infection and should not be used as the sole basis for treatment or other patient management decisions. A negative result may occur with  improper specimen collection/handling, submission of specimen other than nasopharyngeal swab, presence of viral mutation(s) within the areas targeted by this assay, and inadequate number of viral copies(<138 copies/mL). A negative result must be combined  with clinical observations, patient history, and epidemiological information. The expected result is Negative.  Fact Sheet for Patients:  EntrepreneurPulse.com.au  Fact Sheet for Healthcare Providers:  IncredibleEmployment.be  This test is no t yet approved or cleared by the [place] FDA and  has been authorized for detection and/or diagnosis of SARS-CoV-2 by FDA under an Emergency Use Authorization (EUA). This EUA will remain  in effect (meaning this test can be used) for the duration of the COVID-19 declaration under Section 564(b)(1) of the Act, 21 U.S.C.section 360bbb-3(b)(1), unless the authorization is terminated  or revoked sooner.       Influenza A by PCR NEGATIVE NEGATIVE Final   Influenza B by PCR NEGATIVE NEGATIVE Final    Comment: (NOTE) The Xpert Xpress SARS-CoV-2/FLU/RSV plus assay is intended as an aid in the diagnosis of influenza from Nasopharyngeal swab specimens and should not be used as a sole basis for treatment. Nasal washings and aspirates are unacceptable for Xpert Xpress SARS-CoV-2/FLU/RSV testing.  Fact Sheet for Patients: EntrepreneurPulse.com.au  Fact Sheet for Healthcare Providers: IncredibleEmployment.be  This test is not yet approved or cleared by the [place] FDA and has been authorized for detection and/or diagnosis of SARS-CoV-2 by FDA under an Emergency Use Authorization (EUA). This EUA will remain in effect (meaning this test can be used) for the duration of the COVID-19 declaration under Section 564(b)(1) of the Act, 21 U.S.C. section 360bbb-3(b)(1), unless the authorization is terminated or revoked.  Performed at [facility], [place] [address] [place], [place] [address]   Urine Culture     Status: Abnormal   Collection Time: 02/05/21  2:06 AM   Specimen: Urine, Clean Catch  Result Value Ref Range Status   Specimen Description   Final     URINE, CLEAN CATCH Performed at [facility], [place] [address], [place], [place] [address]    Special Requests   Final    NONE Performed at [facility], [place], [place] [address] [place], [place] [address]    Culture (A)  Final    <10,000 COLONIES/mL INSIGNIFICANT GROWTH Performed at [place] [address], [place], [place] [address]    Report Status 02/06/2021 FINAL  Final  Culture, blood (routine x 2)     Status: None (Preliminary result)   Collection Time: 02/05/21  5:17 AM   Specimen: BLOOD  Result Value Ref Range Status   Specimen Description   Final    BLOOD RIGHT ANTECUBITAL Performed at [place] [address] [place],  [place] [address]    Special Requests   Final    BOTTLES DRAWN AEROBIC ONLY Blood Culture adequate volume Performed at [place] [address], [place], [place] [address]    Culture   Final    NO GROWTH 2 DAYS Performed at [place] [address] [place], [place] [address]    Report Status PENDING  Incomplete  Aerobic/Anaerobic Culture w Gram Stain (surgical/deep wound)     Status: None (Preliminary result)   Collection Time: 02/07/21  1:07 PM   Specimen: Abscess  Result Value Ref Range Status   Specimen Description   Final    ABSCESS RIGHT UPPER QUADRANT Performed at [place] [address], [place], [place] [address]    Special Requests   Final    NONE Performed at [facility], [place] [address], [place], [place] [address]    Gram Stain   Final    ABUNDANT WBC PRESENT, PREDOMINANTLY PMN FEW GRAM POSITIVE RODS Performed at [facility], [place] [address] [place], [place] [address]    Culture PENDING  Incomplete   Report Status PENDING  Incomplete    Labs: Results for orders placed or performed during the hospital encounter of 02/04/21 (from the past 48 hour(s))  Protime-INR (coagulopathy lab  panel)     Status: None   Collection Time: 02/07/21  4:01 AM  Result Value Ref Range   Prothrombin Time 14.5 11.4 - 15.2 seconds   INR 1.1 0.8 - 1.2    Comment: (NOTE) INR goal varies based on device and disease states. Performed at [facility], [place] [address] [place], [place] [address]   APTT (coagulopathy lab panel)     Status: None   Collection Time: 02/07/21  4:01 AM  Result Value Ref Range   aPTT 32 24 - 36 seconds    Comment: Performed at [facility], [place] [address] [place], [place] [address]  Fibrinogen (coagulopathy lab panel)     Status: Abnormal   Collection Time: 02/07/21  4:01 AM  Result Value Ref Range   Fibrinogen 572 (H) 210 - 475 mg/dL    Comment: (NOTE) Fibrinogen results may be underestimated in patients receiving thrombolytic therapy. Performed at [facility], [place] [address] [place], [place] [address]   Comprehensive metabolic panel     Status: Abnormal   Collection Time: 02/07/21  4:01 AM  Result Value Ref Range   Sodium 130 (L) 135 - 145 mmol/L   Potassium 4.8 3.5 - 5.1 mmol/L   Chloride 103 98 - 111 mmol/L   CO2 18 (L) 22 - 32 mmol/L   Glucose, Bld 127 (H) 70 - 99 mg/dL    Comment: Glucose reference range applies only to samples taken after fasting for at least 8 hours.   BUN 19 8 - 23 mg/dL   Creatinine, Ser 1.08 (H) 0.44 - 1.00 mg/dL   Calcium 8.1 (L) 8.9 - 10.3 mg/dL   Total Protein 5.6 (L) 6.5 - 8.1 g/dL   Albumin 2.7 (L) 3.5 - 5.0 g/dL   AST 25 15 - 41 U/L   ALT 20 0 - 44 U/L   Alkaline Phosphatase 138 (H) 38 - 126 U/L   Total Bilirubin 0.9 0.3 - 1.2 mg/dL   GFR, Estimated 51 (L) >60 mL/min    Comment: (NOTE) Calculated using the CKD-EPI Creatinine Equation (2021)    Anion gap 9 5 - 15  Comment: Performed at [facility], [place] [address] [place], [place] [address]  CBC     Status: Abnormal   Collection Time: 02/07/21  4:01 AM  Result Value Ref Range   WBC  13.1 (H) 4.0 - 10.5 K/uL   RBC 3.34 (L) 3.87 - 5.11 MIL/uL   Hemoglobin 10.1 (L) 12.0 - 15.0 g/dL   HCT 31.6 (L) 36.0 - 46.0 %   MCV 94.6 80.0 - 100.0 fL   MCH 30.2 26.0 - 34.0 pg   MCHC 32.0 30.0 - 36.0 g/dL   RDW 13.0 11.5 - 15.5 %   Platelets 198 150 - 400 K/uL   nRBC 0.0 0.0 - 0.2 %    Comment: Performed at [facility], [place] [address] [place], [place] [address]  Aerobic/Anaerobic Culture w Gram Stain (surgical/deep wound)     Status: None (Preliminary result)   Collection Time: 02/07/21  1:07 PM   Specimen: Abscess  Result Value Ref Range   Specimen Description      ABSCESS RIGHT UPPER QUADRANT Performed at [place] [address], [place], [place] [address]    Special Requests      NONE Performed at [facility], [place] [address] [place], [place] [address]    Gram Stain      ABUNDANT WBC PRESENT, PREDOMINANTLY PMN FEW GRAM POSITIVE RODS Performed at [facility], [place] [address], [place], [place] [address]    Culture PENDING    Report Status PENDING   Comprehensive metabolic panel     Status: Abnormal   Collection Time: 02/08/21  4:19 AM  Result Value Ref Range   Sodium 135 135 - 145 mmol/L   Potassium 4.9 3.5 - 5.1 mmol/L   Chloride 107 98 - 111 mmol/L   CO2 21 (L) 22 - 32 mmol/L   Glucose, Bld 91 70 - 99 mg/dL    Comment: Glucose reference range applies only to samples taken after fasting for at least 8 hours.   BUN 18 8 - 23 mg/dL   Creatinine, Ser 0.74 0.44 - 1.00 mg/dL   Calcium 8.5 (L) 8.9 - 10.3 mg/dL   Total Protein 5.5 (L) 6.5 - 8.1 g/dL   Albumin 2.6 (L) 3.5 - 5.0 g/dL   AST 27 15 - 41 U/L   ALT 18 0 - 44 U/L   Alkaline Phosphatase 114 38 - 126 U/L   Total Bilirubin 0.7 0.3 - 1.2 mg/dL   GFR, Estimated >60 >60 mL/min    Comment: (NOTE) Calculated using the CKD-EPI Creatinine Equation (2021)    Anion gap 7 5 - 15    Comment: Performed at [facility], [place]  [address] [place], [place] [address]  CBC     Status: Abnormal   Collection Time: 02/08/21  4:19 AM  Result Value Ref Range   WBC 7.9 4.0 - 10.5 K/uL   RBC 3.05 (L) 3.87 - 5.11 MIL/uL   Hemoglobin 9.2 (L) 12.0 - 15.0 g/dL   HCT 28.4 (L) 36.0 - 46.0 %   MCV 93.1 80.0 - 100.0 fL   MCH 30.2 26.0 - 34.0 pg   MCHC 32.4 30.0 - 36.0 g/dL   RDW 13.2 11.5 - 15.5 %   Platelets 191 150 - 400 K/uL   nRBC 0.0 0.0 - 0.2 %    Comment: Performed at [facility], [place] [address] [place], [place] [address]    Imaging / Studies:  CT IMAGE GUIDED DRAINAGE BY PERCUTANEOUS CATHETER  Result Date: 02/07/2021 INDICATION: 86 year old with a postoperative fluid collection at gallbladder fossa. EXAM: CT-GUIDED DRAIN PLACEMENT AT GALLBLADDER FOSSA MEDICATIONS: Moderate sedation ANESTHESIA/SEDATION: Fentanyl 50 mcg IV; Versed 1.0 mg IV Moderate Sedation Time:  20 minutes The patient was continuously monitored during the procedure by the interventional radiology nurse under my direct supervision. COMPLICATIONS: None immediate. PROCEDURE: Informed written consent was obtained from the patient after a thorough discussion of the procedural risks, benefits and alternatives. All questions were addressed. A timeout was performed prior to the initiation of the procedure. Patient was placed supine on the CT scanner. Images through the abdomen were obtained. The air-fluid collection in the gallbladder fossa was identified and targeted. The right upper abdomen was prepped with chlorhexidine and sterile field was created. Maximal barrier sterile technique was utilized including caps, mask, sterile gowns, sterile gloves, sterile drape, hand hygiene and skin antiseptic. Skin was anesthetized using 1% lidocaine. Small incision was made. Using CT guidance, an 18 gauge trocar needle was directed into the gallbladder fossa fluid collection. Pink purulent fluid was aspirated. A superstiff Amplatz wire was advanced into the  collection and the tract was dilated to accommodate a 10 [place] multipurpose drain. Drain was advanced into the collection and follow up CT images were obtained. Approximately 15 mL of purulent fluid was aspirated. Fluid was sent for culture. Catheter was sutured to skin and attached to a suction bulb. FINDINGS: Air-fluid collection in the gallbladder fossa. Collection was decompressed following drain placement. 15 mL of pink opaque fluid was removed. IMPRESSION: CT-guided placement of a drainage catheter within the gallbladder fossa fluid collection. Electronically Signed   By: Markus Daft M.D.   On: 02/07/2021 15:53   NM HEPATOBILIARY LEAK (POST-SURGICAL)  Result Date: 02/06/2021 CLINICAL DATA:  Upper abdominal pain. Recent cholecystectomy (01/25/2021). EXAM: NUCLEAR MEDICINE HEPATOBILIARY IMAGING TECHNIQUE: Sequential images of the abdomen were obtained out to 60 minutes following intravenous administration of radiopharmaceutical. RADIOPHARMACEUTICALS:  5.5 mCi Tc-13m Choletec IV COMPARISON:  Abdominal CT 02/04/2021 FINDINGS: There is prompt uptake and biliary excretion of activity by the liver. There is normal passage of activity into the small bowel. There is no focal retention of activity within the cholecystectomy bed or spillage of activity into the peritoneal cavity. IMPRESSION: Normal hepatobiliary scan status post cholecystectomy. No evidence of bile leak. Electronically Signed   By: WRichardean SaleM.D.   On: 02/06/2021 15:10    Medications / Allergies: per chart  Antibiotics: Anti-infectives (From admission, onward)    Start     Dose/Rate Route Frequency Ordered Stop   02/07/21 2200  vancomycin (VANCOCIN) IVPB 1000 mg/200 mL premix        1,000 mg 200 mL/hr over 60 Minutes Intravenous Every 24 hours 02/07/21 1400     02/07/21 1415  ceFEPIme (MAXIPIME) 2 g in sodium chloride 0.9 % 100 mL IVPB        2 g 200 mL/hr over 30 Minutes Intravenous Every 12 hours 02/07/21 1400     02/06/21 0200   vancomycin (VANCOREADY) IVPB 1250 mg/250 mL  Status:  Discontinued        1,250 mg 166.7 mL/hr over 90 Minutes Intravenous Every 24 hours 02/05/21 0227 02/07/21 1359   02/05/21 0030  vancomycin (VANCOCIN) IVPB 1000 mg/200 mL premix        1,000 mg 200 mL/hr over 60 Minutes Intravenous  Once 02/05/21 0015 02/05/21 0535   02/05/21 0030  ceFEPIme (MAXIPIME) 2 g in  sodium chloride 0.9 % 100 mL IVPB  Status:  Discontinued        2 g 200 mL/hr over 30 Minutes Intravenous Every 24 hours 02/05/21 0015 02/07/21 1359         Note: Portions of this report may have been transcribed using voice recognition software. Every effort was made to ensure accuracy; however, inadvertent computerized transcription errors may be present.   Any transcriptional errors that result from this process are unintentional.    [name], MD, FACS, MASCRS Esophageal, Gastrointestinal & Colorectal Surgery Robotic and Minimally Invasive Surgery  [facility], [place]  [place]. [address] [place],  [address] [phone] Fax [phone] Main  CONTACT INFORMATION:  Weekday (9AM-5PM): Call [facility] main office at [phone]  Weeknight (5PM-9AM) or Weekend/Holiday: Check [email or web address] (password " [email or web address]") for General Surgery [facility] coverage  (Please, do not use SecureChat as it is not reliable communication to operating surgeons for immediate patient care)      02/08/2021  7:39 AM

## 2021-02-08 NOTE — Progress Notes (Signed)
Pharmacy Antibiotic Note  Heather [name] is a 86 y.o. female admitted on 02/04/2021 who recently underwent laparoscopic cholecystectomy with primary repair of an incisional hernia on 7/20.  Yesterday she was seen in urgent clinic with a wound infection. She was noted to have cellulitis at her umbilical incision and was started on Bactrim.  Pt presents to ED with worsening redness and pain at her incision. Pharmacy was intially consulted to dose vancomycin and cefepime for wound infection. Antibiotics are being adjusted to Zosyn only today.       Plan: Zosyn 3.375 gr IV q8h Extended infusion interval Follow renal function and clinical course  Height: '5\' 2"'$  (157.5 cm) Weight: 68.9 kg (152 lb) IBW/kg (Calculated) : 50.1  Temp (24hrs), Avg:97.8 F (36.6 C), Min:97.5 F (36.4 C), Max:98.3 F (36.8 C)  Recent Labs  Lab 02/02/21 0501 02/04/21 2130 02/05/21 0517 02/06/21 0355 02/07/21 0401 02/08/21 0419  WBC  --  12.4*  --  18.8* 13.1* 7.9  CREATININE 0.91 1.44*  --  1.46* 1.08* 0.74  LATICACIDVEN  --  1.7 1.5  --   --   --      Estimated Creatinine Clearance: 47.6 mL/min (by C-G formula based on SCr of 0.74 mg/dL).    Allergies  Allergen Reactions   Baclofen Other (See Comments)    Hyperactivity    Nitrofurantoin Rash     Dosage will likely remain stable at above dose and need for further dosage adjustment appears unlikely at present.    Will sign off at this time.  Please reconsult if a change in clinical status warrants re-evaluation of dosage.    Thank you for allowing pharmacy to be a part of this patient's care.   Royetta Asal, PharmD, BCPS 02/08/2021 9:17 AM

## 2021-02-08 NOTE — TOC Initial Note (Signed)
Transition of Care [facility]) - Initial/Assessment Note    Patient Details  Name: Heather [name] MRN: [id number] Date of Birth: August 06, 1934  [facility]) CM/SW Contact:    Lennart Pall, LCSW Phone Number: 02/08/2021, 1:14 PM  Clinical Narrative:                 Met with pt to introduce self/ TOC role.  Pt recently dc'd home with [facility] providing [place] visits.  Pt notes that her daughter has also been staying with her. Readmitted with sepsis and now receiving IV abx and drain placed.  Anticipating possible dc end of week.  Pt confirms that her daughter will continue staying with her and she would like the [facility] with [facility]  TOC will follow for dc needs.  Expected Discharge Plan: [place] Barriers to Discharge: Continued Medical Work up   Patient Goals and CMS Choice Patient states their goals for this hospitalization and ongoing recovery are:: return home      Expected Discharge Plan and Services Expected Discharge Plan: [place]       Living arrangements for the past 2 months: Single Family Home                                      Prior Living Arrangements/Services Living arrangements for the past 2 months: Single Family Home Lives with:: Self Patient language and need for interpreter reviewed:: Yes Do you feel safe going back to the place where you live?: Yes      Need for Family Participation in Patient Care: Yes (Comment) Care giver support system in place?: Yes (comment)   Criminal Activity/Legal Involvement Pertinent to Current Situation/Hospitalization: No - Comment as needed  Activities of Daily Living Home Assistive Devices/Equipment: Blood pressure cuff, CBG Meter, Cane (specify quad or straight), Walker (specify type), Eyeglasses, Grab bars in shower, Shower chair with back ADL Screening (condition at time of admission) Patient's cognitive ability adequate to safely complete daily activities?: Yes Is  the patient deaf or have difficulty hearing?: No Does the patient have difficulty seeing, even when wearing glasses/contacts?: No Does the patient have difficulty concentrating, remembering, or making decisions?: No Patient able to express need for assistance with ADLs?: Yes Does the patient have difficulty dressing or bathing?: No Independently performs ADLs?: Yes (appropriate for developmental age) Does the patient have difficulty walking or climbing stairs?: Yes Weakness of Legs: Both Weakness of Arms/Hands: Both  Permission Sought/Granted Permission sought to share information with : Family Supports Permission granted to share information with : Yes, Verbal Permission Granted  Share Information with NAME: Anthoney Harada     Permission granted to share info w Relationship: daughter  Permission granted to share info w Contact Information: [phone]  Emotional Assessment Appearance:: Appears stated age Attitude/Demeanor/Rapport: Gracious, Engaged Affect (typically observed): Accepting, Pleasant Orientation: : Oriented to Self, Oriented to Place, Oriented to  Time, Oriented to Situation Alcohol / Substance Use: Not Applicable Psych Involvement: No (comment)  Admission diagnosis:  Sepsis ([place]) [A41.9] Fever, unspecified fever cause [R50.9] Sepsis due to cellulitis ([facility]) [L03.90, A41.9] Patient Active Problem List   Diagnosis Date Noted   Sepsis ([place]) 02/05/2021   AKI (acute kidney injury) ([place]) 02/05/2021   Chronic diastolic CHF (congestive heart failure) ([place]) 02/05/2021   UTI (urinary tract infection) 01/31/2021   Acute on chronic diastolic CHF (congestive heart failure) (  [place]) 01/28/2021   Intra-abdominal fluid collection 01/28/2021   Incarcerated incisional hernia s/p primary repair 01/25/2021 01/25/2021   Stress reaction of bone 01/05/2021   Pain of right sternoclavicular joint 01/05/2021   Screening for osteoporosis 01/05/2021   RUQ pain 12/01/2020   Chronic calculous  cholecystitis s/p lap cholecystectomy 01/25/2021 12/01/2020   Low back pain radiating to right leg 07/14/2020   Pelvic relaxation due to rectocele 04/06/2020   Prolapse of female pelvic organs 12/22/2019   Urinary incontinence, mixed 10/01/2019   Pelvic floor relaxation 06/08/2019   Deep venous thrombosis ([place]) 03/19/2019   Fatigue 03/19/2019   Goiter 03/19/2019   Transient ischemic attack 03/19/2019   Acute dermatitis 03/19/2019   Pelvic prolapse 03/08/2019   Pain of breast 01/29/2019   Overactive bladder 01/29/2019   Educated about COVID-19 virus infection 12/02/2018   Hyperlipidemia associated with type 2 diabetes mellitus ([place]) 05/27/2017   Headache 04/17/2015   Multinodular goiter 01/17/2015   S/P total knee arthroplasty 11/29/2014   Insomnia 02/05/2014   Preventative health care 11/22/2013   RLS (restless legs syndrome) 10/04/2013   Lower urinary tract infectious disease 10/04/2013   Cataracts, bilateral 04/02/2013   Hypokalemia 01/05/2012   Anemia 01/05/2012   Mixed anxiety and depressive disorder 01/05/2012   Postoperative anemia due to acute blood loss 11/15/2011   Staphylococcus aureus carrier 11/15/2011   Pre-syncope 10/13/2011   Hyperglycemia 10/07/2011   DM (diabetes mellitus) ([place]) 10/06/2011   Pulmonary nodule 10/06/2011   Epigastric pain 10/06/2011   It band syndrome, right 08/28/2011   Renal insufficiency 08/28/2011   CORONARY ATHEROSCLEROSIS NATIVE CORONARY ARTERY 03/07/2010   Coronary atherosclerosis 01/18/2009   FATIGUE 01/18/2009   PERSISTENT DISORDER INITIATING/MAINTAINING SLEEP 09/09/2008   DERMATITIS 10/16/2007   PERIPHERAL EDEMA 10/16/2007   Essential hypertension 04/14/2007   Gastroesophageal reflux disease 04/14/2007   PCP:  Mosie Lukes, MD Pharmacy:   [id number]: [phone]: [phone] [facility] ([facility] [place]Phone: [phone]: [phone]    Social Determinants of Health (SDOH) Interventions    Readmission Risk Interventions Readmission Risk Prevention Plan 02/08/2021  Transportation Screening Complete  Medication Review (RN Care Manager) Complete  [place] or [place]  SW Recovery Care/Counseling Consult Complete  Palliative Care Screening Not [place] Applicable  Some recent data might be hidden

## 2021-02-08 NOTE — Progress Notes (Signed)
Physical Therapy Treatment Patient Details Name: Heather [name] MRN: [id number] DOB: 1934-11-25 Today's Date: 02/08/2021    History of Present Illness 86 yo female admitted with UTI, hypotension, sepsis 2* post op cellulitis at incision site. S/P lap chole/hernia repair 01/25/21, CHF, DM, DVT, CAD, RLS    PT Comments    Progressing with mobility. Pt reports moderate pain at drain site.   Follow Up Recommendations  Home health PT     Equipment Recommendations  None recommended by PT    Recommendations for Other Services       Precautions / Restrictions Precautions Precautions: Fall Precaution Comments: R jp drain Restrictions Weight Bearing Restrictions: No    Mobility  Bed Mobility Overal bed mobility: Modified Independent             General bed mobility comments: increased time.    Transfers Overall transfer level: Needs assistance Equipment used: Rolling walker (2 wheeled) Transfers: Sit to/from Stand Sit to Stand: Supervision         General transfer comment: Supv for safety.  Ambulation/Gait Ambulation/Gait assistance: Min guard Gait Distance (Feet): 350 Feet Assistive device: Rolling walker (2 wheeled) Gait Pattern/deviations: Step-through pattern;Decreased stride length     General Gait Details: Min guard for safety.  dyspnea 2/4.   Stairs             Wheelchair Mobility    Modified Rankin (Stroke Patients Only)       Balance Overall balance assessment: Mild deficits observed, not formally tested                                          Cognition Arousal/Alertness: Awake/alert Behavior During Therapy: WFL for tasks assessed/performed Overall Cognitive Status: Within Functional Limits for tasks assessed                                        Exercises      General Comments        Pertinent Vitals/Pain Pain Assessment: 0-10 Pain Score: 6  Pain Location: drain site Pain Descriptors /  Indicators: Discomfort;Sore;Tender Pain Intervention(s): Limited activity within patient's tolerance;Monitored during session;Repositioned    Home Living                      Prior Function            PT Goals (current goals can now be found in the care plan section) Progress towards PT goals: Progressing toward goals    Frequency    Min 3X/week      PT Plan Current plan remains appropriate    Co-evaluation              AM-PAC PT "6 Clicks" Mobility   Outcome Measure  Help needed turning from your back to your side while in a flat bed without using bedrails?: None Help needed moving from lying on your back to sitting on the side of a flat bed without using bedrails?: None Help needed moving to and from a bed to a chair (including a wheelchair)?: A Little Help needed standing up from a chair using your arms (e.g., wheelchair or bedside chair)?: A Little Help needed to walk in hospital room?: A Little Help needed climbing 3-5 steps with a railing? : A Little  6 Click Score: 20    End of Session   Activity Tolerance: Patient tolerated treatment well Patient left: in chair;with call bell/phone within reach   PT Visit Diagnosis: Muscle weakness (generalized) (M62.81);Difficulty in walking, not elsewhere classified (R26.2)     Time: [id number] PT Time Calculation (min) (ACUTE ONLY): 18 min  Charges:  $Gait Training: 8-22 mins                        Doreatha Massed, PT Acute Rehabilitation  Office: [phone] Pager: [phone]

## 2021-02-09 ENCOUNTER — Other Ambulatory Visit (HOSPITAL_COMMUNITY): Payer: Self-pay | Admitting: Radiology

## 2021-02-09 DIAGNOSIS — R188 Other ascites: Secondary | ICD-10-CM

## 2021-02-09 DIAGNOSIS — L0390 Cellulitis, unspecified: Secondary | ICD-10-CM | POA: Diagnosis not present

## 2021-02-09 DIAGNOSIS — I5033 Acute on chronic diastolic (congestive) heart failure: Secondary | ICD-10-CM | POA: Diagnosis not present

## 2021-02-09 DIAGNOSIS — A419 Sepsis, unspecified organism: Secondary | ICD-10-CM | POA: Diagnosis not present

## 2021-02-09 DIAGNOSIS — N179 Acute kidney failure, unspecified: Secondary | ICD-10-CM | POA: Diagnosis not present

## 2021-02-09 LAB — CBC
HCT: 31 % — ABNORMAL LOW (ref 36.0–46.0)
Hemoglobin: 10 g/dL — ABNORMAL LOW (ref 12.0–15.0)
MCH: 30 pg (ref 26.0–34.0)
MCHC: 32.3 g/dL (ref 30.0–36.0)
MCV: 93.1 fL (ref 80.0–100.0)
Platelets: 214 10*3/uL (ref 150–400)
RBC: 3.33 MIL/uL — ABNORMAL LOW (ref 3.87–5.11)
RDW: 13.2 % (ref 11.5–15.5)
WBC: 5.8 10*3/uL (ref 4.0–10.5)
nRBC: 0 % (ref 0.0–0.2)

## 2021-02-09 LAB — BASIC METABOLIC PANEL
Anion gap: 10 (ref 5–15)
BUN: 17 mg/dL (ref 8–23)
CO2: 23 mmol/L (ref 22–32)
Calcium: 8.6 mg/dL — ABNORMAL LOW (ref 8.9–10.3)
Chloride: 105 mmol/L (ref 98–111)
Creatinine, Ser: 0.97 mg/dL (ref 0.44–1.00)
GFR, Estimated: 58 mL/min — ABNORMAL LOW (ref >60)
Glucose, Bld: 88 mg/dL (ref 70–99)
Potassium: 4.4 mmol/L (ref 3.5–5.1)
Sodium: 138 mmol/L (ref 135–145)

## 2021-02-09 MED ORDER — CYCLOBENZAPRINE HCL 5 MG PO TABS
5.0000 mg | ORAL_TABLET | Freq: Three times a day (TID) | ORAL | Status: DC | PRN
  Administered 2021-02-09 (×2): 5 mg via ORAL
  Filled 2021-02-09 (×2): qty 1

## 2021-02-09 NOTE — Care Management Important Message (Signed)
Important Message  Patient Details IM Letter given to the Patient. Name: Heather [name] MRN: [id number] Date of Birth: Jan 15, 1935   Medicare Important Message Given:  Yes     Kerin Salen 02/09/2021, 3:47 PM

## 2021-02-09 NOTE — Progress Notes (Signed)
PROGRESS NOTE  Heather [name] JKD:[id number] DOB: [birth year] DOA: 02/04/2021 PCP: Mosie Lukes, MD  HPI/Recap of past 5 hours: 86 year old female with past medical history of CAD, DVT, diabetes mellitus, chronic diastolic CHF and recent lap chole admitted on 7/30 for sepsis secondary to cellulitis around incision site.Started on fluids and antibiotics and admitted to the hospitalist service.  Seen by general surgery.  HIDA scan negative.  Interventional radiology placed drain on 8/2.  Patient with some minimal soreness.  No bowel movement since yesterday.  Tolerating Lasix.  Assessment/Plan: Sepsis secondary to cellulitis at postop umbilical incision site: Patient met criteria for sepsis on admission given fever and leukocytosis.  Has been on vancomycin and cefepime.  HIDA scan negative.  Status post drain placement by interventional radiology 8/2.  Plan will be to discharge patient with drain and p.o. Augmentin on 8/5.  Acute kidney injury: 1.44 on admission.  Responded to IV fluids and now normalized.  Blood pressure initially soft, improving.  On Imdur.  Acute on chronic diastolic heart failure: In response to fluid resuscitation.  Fluids now stopped now that sepsis has been stabilized.  Status post 2 doses of Lasix.  Restless leg syndrome: Continue pramiprexole.  Left posterior vitreous detachment: Patient had noted floaters.  Ophthalmology evaluated patient and found posterior vitreous detachment with recommendation for repeated dilated exam in 1 month.  Code Status: Full code  Family Communication: Daughter at bedside  Disposition Plan: Discharge home with home health on 8/5.  Patient will follow-up with interventional radiology next week for CT scan to determine if drain can be removed.  Upon discharge, change IV antibiotics to p.o. until 8/7   Consultants: General surgery Interventional radiology Ophthalmology  Procedures: HIDA scan: Normal Drain  placement  Antimicrobials: IV vancomycin and cefepime 7/31-8/3 IV Zosyn 8/3-present  DVT prophylaxis: Lovenox  Level of care: Med-Surg   Objective: Vitals:   02/09/21 0532 02/09/21 1339  BP: 135/73 (!) 138/59  Pulse: 64 (!) 58  Resp: 17 16  Temp: 97.7 F (36.5 C) 97.8 F (36.6 C)  SpO2: 97% 98%    Intake/Output Summary (Last 24 hours) at 02/09/2021 1550 Last data filed at 02/09/2021 1504 Gross per 24 hour  Intake 760.35 ml  Output 560 ml  Net 200.35 ml    Filed Weights   02/04/21 2050  Weight: 68.9 kg   Body mass index is 27.8 kg/m.  Exam:  General: Alert and oriented x3, no acute distress Cardiovascular: Regular rate and rhythm, S1-S2 Respiratory: Clear to auscultation bilaterally Abdomen: Soft, nontender, nondistended, positive bowel sounds, drain in place Musculoskeletal: No clubbing or cyanosis or edema Skin: No skin breaks, tears or lesions Psychiatry: Appropriate, no evidence of psychoses Neurology: No focal deficits   Data Reviewed: CBC: Recent Labs  Lab 02/04/21 2130 02/06/21 0355 02/07/21 0401 02/08/21 0419 02/09/21 0432  WBC 12.4* 18.8* 13.1* 7.9 5.8  NEUTROABS 11.9*  --   --   --   --   HGB 11.9* 9.5* 10.1* 9.2* 10.0*  HCT 36.3 29.4* 31.6* 28.4* 31.0*  MCV 92.6 93.3 94.6 93.1 93.1  PLT 291 222 198 191 099    Basic Metabolic Panel: Recent Labs  Lab 02/04/21 2130 02/06/21 0355 02/07/21 0401 02/08/21 0419 02/09/21 0432  NA 131* 132* 130* 135 138  K 4.6 4.8 4.8 4.9 4.4  CL 99 100 103 107 105  CO2 23 21* 18* 21* 23  GLUCOSE 118* 93 127* 91 88  BUN 19 25* 19 18 17  CREATININE 1.44* 1.46* 1.08* 0.74 0.97  CALCIUM 8.7* 8.0* 8.1* 8.5* 8.6*    GFR: Estimated Creatinine Clearance: 39.3 mL/min (by C-G formula based on SCr of 0.97 mg/dL). Liver Function Tests: Recent Labs  Lab 02/04/21 2130 02/06/21 0355 02/07/21 0401 02/08/21 0419  AST _0 ALT _1 ALKPHOS 164* 112 138* 114  BILITOT 0.6 0.7 0.9 0.7  PROT 6.6  5.4* 5.6* 5.5*  ALBUMIN 3.1* 2.6* 2.7* 2.6*    No results for input(s): LIPASE, AMYLASE in the last 168 hours. No results for input(s): AMMONIA in the last 168 hours. Coagulation Profile: Recent Labs  Lab 02/07/21 0401  INR 1.1    Cardiac Enzymes: No results for input(s): CKTOTAL, CKMB, CKMBINDEX, TROPONINI in the last 168 hours. BNP (last 3 results) Recent Labs    08/19/20 1351  PROBNP 428    HbA1C: No results for input(s): HGBA1C in the last 72 hours. CBG: Recent Labs  Lab 02/05/21 0419  GLUCAP 133*    Lipid Profile: No results for input(s): CHOL, HDL, LDLCALC, TRIG, CHOLHDL, LDLDIRECT in the last 72 hours. Thyroid Function Tests: No results for input(s): TSH, T4TOTAL, FREET4, T3FREE, THYROIDAB in the last 72 hours. Anemia Panel: No results for input(s): VITAMINB12, FOLATE, FERRITIN, TIBC, IRON, RETICCTPCT in the last 72 hours. Urine analysis:    Component Value Date/Time   COLORURINE YELLOW 02/05/2021 0206   APPEARANCEUR CLOUDY (A) 02/05/2021 0206   LABSPEC <1.005 (L) 02/05/2021 0206   PHURINE 5.0 02/05/2021 0206   GLUCOSEU NEGATIVE 02/05/2021 0206   GLUCOSEU NEGATIVE 09/21/2016 1103   HGBUR NEGATIVE 02/05/2021 0206   BILIRUBINUR NEGATIVE 02/05/2021 0206   BILIRUBINUR neg 11/24/2020 1204   KETONESUR 5 (A) 02/05/2021 0206   PROTEINUR NEGATIVE 02/05/2021 0206   UROBILINOGEN 0.2 11/24/2020 1204   UROBILINOGEN 0.2 09/21/2016 1103   NITRITE NEGATIVE 02/05/2021 0206   LEUKOCYTESUR SMALL (A) 02/05/2021 0206   Sepsis Labs: _2 (procalcitonin:4,lacticidven:4)  ) Recent Results (from the past 240 hour(s))  Urine Culture     Status: Abnormal   Collection Time: 01/31/21 12:49 PM   Specimen: Urine, Clean Catch  Result Value Ref Range Status   Specimen Description   Final    URINE, CLEAN CATCH Performed at [facility], [place] [address], [place], [place] [address]    Special Requests   Final    NONE Performed at [facility], [place] [address] [place], [place] [address]    Culture >=100,000 COLONIES/mL KLEBSIELLA OXYTOCA (A)  Final   Report Status 02/02/2021 FINAL  Final   Organism ID, Bacteria KLEBSIELLA OXYTOCA (A)  Final      Susceptibility   Klebsiella oxytoca - MIC*    AMPICILLIN >=32 RESISTANT Resistant     CEFAZOLIN 8 SENSITIVE Sensitive     CEFEPIME <=0.12 SENSITIVE Sensitive     CEFTRIAXONE <=0.25 SENSITIVE Sensitive     CIPROFLOXACIN <=0.25 SENSITIVE Sensitive     GENTAMICIN <=1 SENSITIVE Sensitive     IMIPENEM <=0.25 SENSITIVE Sensitive     NITROFURANTOIN <=16 SENSITIVE Sensitive     TRIMETH/SULFA <=20 SENSITIVE Sensitive     AMPICILLIN/SULBACTAM 16 INTERMEDIATE Intermediate     PIP/TAZO <=4 SENSITIVE Sensitive     * >=100,000 COLONIES/mL KLEBSIELLA OXYTOCA  Culture, blood (routine x 2)     Status: None (Preliminary result)   Collection Time: 02/04/21  9:30 PM   Specimen: BLOOD  Result Value Ref Range Status   Specimen Description   Final  BLOOD SITE NOT SPECIFIED Performed at [facility], [place] [address] [place], [place] [address]    Special Requests   Final    BOTTLES DRAWN AEROBIC AND ANAEROBIC Blood Culture adequate volume Performed at [place] [address], [place], [place] [address]    Culture  Setup Time   Final    GRAM POSITIVE RODS ANAEROBIC BOTTLE ONLY CRITICAL RESULT CALLED TO, READ BACK BY AND VERIFIED WITH: m. lilliston PHARMD, AT 7062 02/07/21 D. Victoriano Lain    Culture   Final    GRAM POSITIVE RODS IDENTIFICATION TO FOLLOW Performed at [facility], [place] [address] [place], [place] [address]    Report Status PENDING  Incomplete  Resp Panel by RT-PCR (Flu A&B, Covid) Nasopharyngeal Swab     Status: None   Collection Time: 02/04/21  9:30 PM   Specimen: Nasopharyngeal Swab; Nasopharyngeal(NP) swabs in vial transport medium  Result Value Ref Range Status   SARS Coronavirus 2 by RT PCR NEGATIVE NEGATIVE Final     Comment: (NOTE) SARS-CoV-2 target nucleic acids are NOT DETECTED.  The SARS-CoV-2 RNA is generally detectable in upper respiratory specimens during the acute phase of infection. The lowest concentration of SARS-CoV-2 viral copies this assay can detect is 138 copies/mL. A negative result does not preclude SARS-Cov-2 infection and should not be used as the sole basis for treatment or other patient management decisions. A negative result may occur with  improper specimen collection/handling, submission of specimen other than nasopharyngeal swab, presence of viral mutation(s) within the areas targeted by this assay, and inadequate number of viral copies(<138 copies/mL). A negative result must be combined with clinical observations, patient history, and epidemiological information. The expected result is Negative.  Fact Sheet for Patients:  EntrepreneurPulse.com.au  Fact Sheet for Healthcare Providers:  IncredibleEmployment.be  This test is no t yet approved or cleared by the [place] FDA and  has been authorized for detection and/or diagnosis of SARS-CoV-2 by FDA under an Emergency Use Authorization (EUA). This EUA will remain  in effect (meaning this test can be used) for the duration of the COVID-19 declaration under Section 564(b)(1) of the Act, 21 U.S.C.section 360bbb-3(b)(1), unless the authorization is terminated  or revoked sooner.       Influenza A by PCR NEGATIVE NEGATIVE Final   Influenza B by PCR NEGATIVE NEGATIVE Final    Comment: (NOTE) The Xpert Xpress SARS-CoV-2/FLU/RSV plus assay is intended as an aid in the diagnosis of influenza from Nasopharyngeal swab specimens and should not be used as a sole basis for treatment. Nasal washings and aspirates are unacceptable for Xpert Xpress SARS-CoV-2/FLU/RSV testing.  Fact Sheet for Patients: EntrepreneurPulse.com.au  Fact Sheet for Healthcare  Providers: IncredibleEmployment.be  This test is not yet approved or cleared by the [place] FDA and has been authorized for detection and/or diagnosis of SARS-CoV-2 by FDA under an Emergency Use Authorization (EUA). This EUA will remain in effect (meaning this test can be used) for the duration of the COVID-19 declaration under Section 564(b)(1) of the Act, 21 U.S.C. section 360bbb-3(b)(1), unless the authorization is terminated or revoked.  Performed at [facility], [place] [address] [place], [place] [address]   Urine Culture     Status: Abnormal   Collection Time: 02/05/21  2:06 AM   Specimen: Urine, Clean Catch  Result Value Ref Range Status   Specimen Description   Final    URINE, CLEAN CATCH Performed at [facility], [address] [place], [place] [address]    Special Requests   Final    NONE Performed at [facility], [place] [address], [place], [place] [address]    Culture (A)  Final    <10,000 COLONIES/mL INSIGNIFICANT GROWTH Performed at [place] [address], [place], [place] [address]    Report Status 02/06/2021 FINAL  Final  Culture, blood (routine x 2)     Status: None (Preliminary result)   Collection Time: 02/05/21  5:17 AM   Specimen: BLOOD  Result Value Ref Range Status   Specimen Description   Final    BLOOD RIGHT ANTECUBITAL Performed at [place] [address] [place], [place] [address]    Special Requests   Final    BOTTLES DRAWN AEROBIC ONLY Blood Culture adequate volume Performed at [place] [address] [place], [place] [address]    Culture   Final    NO GROWTH 4 DAYS Performed at [facility], [place] [address], [place], [place] [address]    Report Status PENDING  Incomplete  Aerobic/Anaerobic Culture w Gram Stain (surgical/deep wound)     Status: None (Preliminary result)   Collection Time:  02/07/21  1:07 PM   Specimen: Abscess  Result Value Ref Range Status   Specimen Description   Final    ABSCESS RIGHT UPPER QUADRANT Performed at [place] [address], [place], [place] [address]    Special Requests   Final    NONE Performed at [facility], [place] [address] [place], [place] [address]    Gram Stain   Final    ABUNDANT WBC PRESENT, PREDOMINANTLY PMN FEW GRAM POSITIVE RODS Performed at [facility], [place] [address], [place], [place] [address]    Culture   Final    MODERATE GRAM NEGATIVE RODS IDENTIFICATION AND SUSCEPTIBILITIES TO FOLLOW CULTURE REINCUBATED FOR BETTER GROWTH NO ANAEROBES ISOLATED; CULTURE IN PROGRESS FOR 5 DAYS    Report Status PENDING  Incomplete  Culture, blood (routine x 2)     Status: None (Preliminary result)   Collection Time: 02/07/21  3:07 PM   Specimen: Right Antecubital; Blood  Result Value Ref Range Status   Specimen Description   Final    RIGHT ANTECUBITAL Performed at [facility], [place] [address], [place], [place] [address]    Special Requests   Final    BOTTLES DRAWN AEROBIC ONLY Blood Culture adequate volume Performed at [place] [address] [place], [place] [address]    Culture   Final    NO GROWTH 2 DAYS Performed at [place] [address], [place], [place] [address]    Report Status PENDING  Incomplete  Culture, blood (routine x 2)     Status: None (Preliminary result)   Collection Time: 02/07/21  3:15 PM   Specimen: BLOOD LEFT WRIST  Result Value Ref Range Status   Specimen Description   Final    BLOOD LEFT WRIST Performed at [place] [address], [place], [place] [address]    Special Requests   Final    BOTTLES DRAWN AEROBIC ONLY Blood Culture adequate volume Performed at [place] [address] [place], [place] [address]    Culture   Final    NO GROWTH 2  DAYS Performed at [place] [address] [place], [place] [address]  Report Status PENDING  Incomplete      Studies: No results found.  Scheduled Meds:  acetaminophen  1,000 mg Oral Q6H   ascorbic acid  500 mg Oral Daily   aspirin EC  81 mg Oral q AM   cholecalciferol  2,000 Units Oral Daily   enoxaparin (LOVENOX) injection  40 mg Subcutaneous Q24H   famotidine  20 mg Oral QHS   feeding supplement (GLUCERNA SHAKE)  237 mL Oral BID BM   furosemide  20 mg Intravenous BID   isosorbide mononitrate  90 mg Oral QPM   lip balm  1 application Topical BID   magnesium oxide  400 mg Oral Daily   pantoprazole  40 mg Oral QHS   potassium chloride SA  10 mEq Oral QPM   pramipexole  0.25 mg Oral BID   psyllium  1 packet Oral BID   simvastatin  20 mg Oral QPM   sodium chloride flush  5 mL Intracatheter Q8H   zinc sulfate  220 mg Oral Daily    Continuous Infusions:  ondansetron (ZOFRAN) IV     piperacillin-tazobactam (ZOSYN)  IV 3.375 g (02/09/21 0836)     LOS: 4 days     Annita Brod, MD [facility]   02/09/2021, 3:50 PM

## 2021-02-09 NOTE — Progress Notes (Signed)
Referring Physician(s): Gross,S  Supervising Physician: Dr. Annamaria Boots  Patient Status:  WL IP  Chief Complaint: Right upper abdominal pain, gallbladder fossa fluid collection   Subjective: Feeling better. Dr. Johney Maine note reviewed Possible discharge soon   Allergies: Baclofen, Nitrofurantoin, and Tramadol  Medications:  Current Facility-Administered Medications:    acetaminophen (TYLENOL) tablet 1,000 mg, 1,000 mg, Oral, Q6H, Michael Boston, MD, 1,000 mg at 02/09/21 1235   alum & mag hydroxide-simeth (MAALOX/MYLANTA) 200-200-20 MG/5ML suspension 30 mL, 30 mL, Oral, Q6H PRN, Michael Boston, MD   ascorbic acid (VITAMIN C) tablet 500 mg, 500 mg, Oral, Daily, Tu, Ching T, DO, 500 mg at 02/09/21 [id number]   aspirin EC tablet 81 mg, 81 mg, Oral, q AM, Tu, Ching T, DO, 81 mg at 02/09/21 0830   cholecalciferol (VITAMIN D3) tablet 2,000 Units, 2,000 Units, Oral, Daily, Tu, Ching T, DO, 2,000 Units at 02/09/21 [id number]   cyclobenzaprine (FLEXERIL) tablet 5 mg, 5 mg, Oral, TID PRN, Michael Boston, MD, 5 mg at 02/09/21 0830   diphenhydrAMINE (BENADRYL) capsule 25 mg, 25 mg, Oral, QHS PRN, Annita Brod, MD   enalaprilat (VASOTEC) injection 0.625-1.25 mg, 0.625-1.25 mg, Intravenous, Q6H PRN, Michael Boston, MD   enoxaparin (LOVENOX) injection 40 mg, 40 mg, Subcutaneous, Q24H, Allred, Darrell K, PA-C, 40 mg at 02/09/21 0834   famotidine (PEPCID) tablet 20 mg, 20 mg, Oral, QHS, Michael Boston, MD, 20 mg at 02/08/21 2200   feeding supplement (GLUCERNA SHAKE) (GLUCERNA SHAKE) liquid 237 mL, 237 mL, Oral, BID BM, Michael Boston, MD   furosemide (LASIX) injection 20 mg, 20 mg, Intravenous, BID, Annita Brod, MD, 20 mg at 02/09/21 0831   hydrOXYzine (ATARAX/VISTARIL) tablet 10 mg, 10 mg, Oral, TID PRN, Donne Hazel, MD, 10 mg at 02/06/21 2344   isosorbide mononitrate (IMDUR) 24 hr tablet 90 mg, 90 mg, Oral, QPM, Tu, Ching T, DO, 90 mg at 02/08/21 1728   lip balm (CARMEX) ointment 1 application, 1  application, Topical, BID, Michael Boston, MD, 1 application at [id number]   magic mouthwash, 15 mL, Oral, QID PRN, Michael Boston, MD   magnesium oxide (MAG-OX) tablet 400 mg, 400 mg, Oral, Daily, Tu, Ching T, DO, 400 mg at 02/09/21 0830   metoprolol tartrate (LOPRESSOR) injection 5 mg, 5 mg, Intravenous, Q6H PRN, Michael Boston, MD   metoprolol tartrate (LOPRESSOR) tablet 12.5 mg, 12.5 mg, Oral, Q12H PRN, Michael Boston, MD   morphine 2 MG/ML injection 0.5-1 mg, 0.5-1 mg, Intravenous, Q4H PRN, Annita Brod, MD, 1 mg at 02/08/21 1850   ondansetron (ZOFRAN) injection 4 mg, 4 mg, Intravenous, Q6H PRN, 4 mg at 02/08/21 1650 **OR** ondansetron (ZOFRAN) 8 mg in sodium chloride 0.9 % 50 mL IVPB, 8 mg, Intravenous, Q6H PRN, Michael Boston, MD   oxyCODONE (Oxy IR/ROXICODONE) immediate release tablet 5-10 mg, 5-10 mg, Oral, Q6H PRN, Annita Brod, MD   pantoprazole (PROTONIX) EC tablet 40 mg, 40 mg, Oral, QHS, Donne Hazel, MD, 40 mg at 02/08/21 2159   piperacillin-tazobactam (ZOSYN) IVPB 3.375 g, 3.375 g, Intravenous, Q8H, Michael Boston, MD, Last Rate: 12.5 mL/hr at 02/09/21 0836, 3.375 g at 02/09/21 0836   potassium chloride (KLOR-CON) CR tablet 10 mEq, 10 mEq, Oral, QPM, Tu, Ching T, DO, 10 mEq at 02/08/21 1727   pramipexole (MIRAPEX) tablet 0.25 mg, 0.25 mg, Oral, BID, Tu, Ching T, DO, 0.25 mg at 02/09/21 0830   prochlorperazine (COMPAZINE) injection 5-10 mg, 5-10 mg, Intravenous, Q4H PRN, Michael Boston, MD  psyllium (HYDROCIL/METAMUCIL) 1 packet, 1 packet, Oral, BID, Gross, Remo Lipps, MD   simvastatin (ZOCOR) tablet 20 mg, 20 mg, Oral, QPM, Tu, Ching T, DO, 20 mg at 02/08/21 1727   sodium chloride flush (NS) 0.9 % injection 5 mL, 5 mL, Intracatheter, Q8H, Henn, Adam, MD, 5 mL at 02/09/21 1235   zinc sulfate capsule 220 mg, 220 mg, Oral, Daily, Tu, Ching T, DO, 220 mg at 02/09/21 0829    Vital Signs: BP 135/73 (BP Location: Right Arm)   Pulse 64   Temp 97.7 F (36.5 C)   Resp 17   Ht  '5\' 2"'$  (1.575 m)   Wt 68.9 kg   LMP  (LMP Unknown)   SpO2 97%   BMI 27.80 kg/m   Physical Exam awake, alert.  Right upper quadrant drain intact, insertion site mild to moderately tender, no obvious leaking in region, output 10 cc serosanguineous appearing fluid  Imaging: CT HEAD WO CONTRAST  Result Date: 02/05/2021 CLINICAL DATA:  Transient ischemic attack (TIA). Additional history provided: Patient reports "floaters" in eyes. EXAM: CT HEAD WITHOUT CONTRAST TECHNIQUE: Contiguous axial images were obtained from the base of the skull through the vertex without intravenous contrast. COMPARISON:  Brain MRI 08/13/2017.  Head CT 04/01/2013. FINDINGS: Brain: Mild generalized parenchymal atrophy. Mild-to-moderate patchy and ill-defined hypoattenuation within the cerebral white matter, nonspecific but compatible with chronic small vessel ischemic disease. Bilateral basal ganglia mineralization. There is no acute intracranial hemorrhage. No demarcated cortical infarct. No extra-axial fluid collection. No evidence of an intracranial mass. No midline shift. Vascular: No hyperdense vessel.  Atherosclerotic calcifications Skull: Normal. Negative for fracture or focal lesion. Sinuses/Orbits: Visualized orbits show no acute finding. No significant paranasal sinus disease at the imaged levels. IMPRESSION: No evidence of acute intracranial abnormality. Mild-to-moderate chronic small vessel ischemic changes within the cerebral white matter, progressed from the head CT of 04/01/2013. Mild generalized parenchymal atrophy. Electronically Signed   By: Kellie Simmering DO   On: 02/05/2021 13:18   CT IMAGE GUIDED DRAINAGE BY PERCUTANEOUS CATHETER  Result Date: 02/07/2021 INDICATION: 86 year old with a postoperative fluid collection at gallbladder fossa. EXAM: CT-GUIDED DRAIN PLACEMENT AT GALLBLADDER FOSSA MEDICATIONS: Moderate sedation ANESTHESIA/SEDATION: Fentanyl 50 mcg IV; Versed 1.0 mg IV Moderate Sedation Time:  20 minutes  The patient was continuously monitored during the procedure by the interventional radiology nurse under my direct supervision. COMPLICATIONS: None immediate. PROCEDURE: Informed written consent was obtained from the patient after a thorough discussion of the procedural risks, benefits and alternatives. All questions were addressed. A timeout was performed prior to the initiation of the procedure. Patient was placed supine on the CT scanner. Images through the abdomen were obtained. The air-fluid collection in the gallbladder fossa was identified and targeted. The right upper abdomen was prepped with chlorhexidine and sterile field was created. Maximal barrier sterile technique was utilized including caps, mask, sterile gowns, sterile gloves, sterile drape, hand hygiene and skin antiseptic. Skin was anesthetized using 1% lidocaine. Small incision was made. Using CT guidance, an 18 gauge trocar needle was directed into the gallbladder fossa fluid collection. Pink purulent fluid was aspirated. A superstiff Amplatz wire was advanced into the collection and the tract was dilated to accommodate a 10 [place] multipurpose drain. Drain was advanced into the collection and follow up CT images were obtained. Approximately 15 mL of purulent fluid was aspirated. Fluid was sent for culture. Catheter was sutured to skin and attached to a suction bulb. FINDINGS: Air-fluid collection in the gallbladder fossa. Collection was  decompressed following drain placement. 15 mL of pink opaque fluid was removed. IMPRESSION: CT-guided placement of a drainage catheter within the gallbladder fossa fluid collection. Electronically Signed   By: Markus Daft M.D.   On: 02/07/2021 15:53   NM HEPATOBILIARY LEAK (POST-SURGICAL)  Result Date: 02/06/2021 CLINICAL DATA:  Upper abdominal pain. Recent cholecystectomy (01/25/2021). EXAM: NUCLEAR MEDICINE HEPATOBILIARY IMAGING TECHNIQUE: Sequential images of the abdomen were obtained out to 60 minutes  following intravenous administration of radiopharmaceutical. RADIOPHARMACEUTICALS:  5.5 mCi Tc-63m Choletec IV COMPARISON:  Abdominal CT 02/04/2021 FINDINGS: There is prompt uptake and biliary excretion of activity by the liver. There is normal passage of activity into the small bowel. There is no focal retention of activity within the cholecystectomy bed or spillage of activity into the peritoneal cavity. IMPRESSION: Normal hepatobiliary scan status post cholecystectomy. No evidence of bile leak. Electronically Signed   By: WRichardean SaleM.D.   On: 02/06/2021 15:10    Labs:  CBC: Recent Labs    02/06/21 0355 02/07/21 0401 02/08/21 0419 02/09/21 0432  WBC 18.8* 13.1* 7.9 5.8  HGB 9.5* 10.1* 9.2* 10.0*  HCT 29.4* 31.6* 28.4* 31.0*  PLT 222 198 191 214     COAGS: Recent Labs    02/07/21 0401  INR 1.1  APTT 32     BMP: Recent Labs    08/19/20 1351 08/26/20 2159 02/06/21 0355 02/07/21 0401 02/08/21 0419 02/09/21 0432  NA 140   < > 132* 130* 135 138  K 4.8   < > 4.8 4.8 4.9 4.4  CL 101   < > 100 103 107 105  CO2 22   < > 21* 18* 21* 23  GLUCOSE 130*   < > 93 127* 91 88  BUN 22   < > 25* '19 18 17  '$ CALCIUM 9.5   < > 8.0* 8.1* 8.5* 8.6*  CREATININE 1.04*   < > 1.46* 1.08* 0.74 0.97  GFRNONAA 50*   < > 35* 51* >60 58*  GFRAA 57*  --   --   --   --   --    < > = values in this interval not displayed.     LIVER FUNCTION TESTS: Recent Labs    02/04/21 2130 02/06/21 0355 02/07/21 0401 02/08/21 0419  BILITOT 0.6 0.7 0.9 0.7  AST '28 24 25 27  '$ ALT '27 20 20 18  '$ ALKPHOS 164* 112 138* 114  PROT 6.6 5.4* 5.6* 5.5*  ALBUMIN 3.1* 2.6* 2.7* 2.6*     Assessment and Plan: Patient status post laparoscopic cholecystectomy and primary incisional hernia repair on 01/25/2021; now with postop gallbladder fossa fluid collection, nuclear medicine study negative for bile leak, status post drain placement on 8/2. Drain output trending down. If stable for discharge, would recommend  keep drain and IR team will set up follow up next for repeat imaging and likely drain removal. Outpt order placed, IR clinic will contact pt to schedule follow up.  Electronically Signed: KAscencion Dike PA-C 02/09/2021, 1:07 PM   I spent a total of 15 Minutes at the the patient's bedside AND on the patient's hospital floor or unit, greater than 50% of which was counseling/coordinating care for gallbladder fossa fluid collection drain

## 2021-02-09 NOTE — Progress Notes (Signed)
Heather [name] [id number] 1935-06-29  CARE TEAM:  PCP: Mosie Lukes, MD  Outpatient Care Team: Patient Care Team: Mosie Lukes, MD as PCP - General (Family Medicine) [name], MD as PCP - Cardiology (Cardiology) Idelle Leech, [place] (Optometry) Janyth Contes, MD as Consulting Physician (Obstetrics and Gynecology) Trula Slade, DPM as Consulting Physician (Podiatry) Sharmon Revere as Physician Assistant (Cardiology) [name], PharmD (Pharmacist) Michael Boston, MD as Consulting Physician (General Surgery) Jaquita Folds, MD as Consulting Physician (Gynecology) Armbruster, Carlota Raspberry, MD as Consulting Physician (Gastroenterology)  Inpatient Treatment Team: Treatment Team: Attending Provider: Annita Brod, MD; Consulting Physician: Michael Boston, MD; Rounding Team: Dorthy Cooler Radiology, MD; Rounding Team: Redmond Baseman, MD; Mobility Specialist: Viviann Spare Charge Nurse: Sunny Schlein, RN; Technician: Karna Christmas, NT; Registered Nurse: Donia Ast, RN; Utilization Review: Hetty Ely, RN   Problem List:   Principal Problem:   Sepsis [facility]) Active Problems:   Essential hypertension   Coronary atherosclerosis   DM (diabetes mellitus) ([place])   Staphylococcus aureus carrier   RLS (restless legs syndrome)   Chronic calculous cholecystitis s/p lap cholecystectomy 01/25/2021   Incarcerated incisional hernia s/p primary repair 01/25/2021   Intra-abdominal fluid collection   UTI (urinary tract infection)   AKI (acute kidney injury) ([place])   Chronic diastolic CHF (congestive heart failure) ([place])      01/25/2021  POST-OPERATIVE DIAGNOSIS:    Chronic Calculus cholecystitis Incarcerated incisional hernias   PROCEDURE:  Laparoscopic cholecystectomy with intraoperative cholangiogram (CPT code [id number]) Reduction and primary repair of incarcerated incisional hernias   SURGEON:  [name], MD,  FACS.  OR FINDINGS:  Dilated boggy gallbladder with thickening consistent with chronic cholecystitis.  Very narrow cystic duct with stricturing suspicious for chronic partial cystic duct outlet obstruction.  Cholangiogram with dilated intra and extrahepatic ducts most likely consistent with advanced age.  No evidence of choledocholithiasis nor leak.   Liver: normal no needle biopsy done   Supraumbilical 7 x 3 cm region of incisional hernia incarcerated with omentum.  Largest periumbilical.  Reduction debridement and primary repair done.  No mesh repair given the fact cholecystectomy was done at the same time. SURGICAL PATHOLOGY  CASE: [id number]  PATIENT: Heather [name]  Surgical Pathology Report      Clinical History: Symptomatic biliary colic, probable chronic  cholecystitis, incisional hernia ([facility])    FINAL MICROSCOPIC DIAGNOSIS:   A. GALLBLADDER, CHOLECYSTECTOMY:  -  Chronic cholecystitis with cholelithiasis   Heather [name] DESCRIPTION:   Specimen: Gallbladder, received fresh  Size/?Intact: 12.2 x 4 x 2.6 cm, intact  Serosal surface: Pink green with few scattered fatty adhesions  Mucosa/Wall: The mucosa varies from light green to red-green, and  smooth, soft.  The wall averages 0.2 cm thick.  Contents: There are multiple black roughened choleliths ranging from  minute fragments to 1.2 cm.  Cystic duct: Patent  Block Summary:  Representative sections in 1 block.   SW 01/26/2021     Final Diagnosis performed by Thressa Sheller, MD.   Electronically signed  01/27/2021  Technical component performed at [facility], [place]  [address] [place], [place] [address].   Professional component performed at [employer],  [place] [address], [place], [place] [address].   Immunohistochemistry Technical component (if applicable) was performed  at [facility]. [address], [place],  [place],  [address].   IMMUNOHISTOCHEMISTRY  DISCLAIMER (if applicable):  Some of these  immunohistochemical stains may have been developed and the  performance characteristics determine by [facility]. Some  may not have been cleared or approved by the U.S. Food and Drug  Administration. The FDA has determined that such clearance or approval  is not necessary. This test is used for clinical purposes. It should not  be regarded as investigational or for research. This laboratory is  certified under the [place]  (CLIA-88) as qualified to perform high complexity clinical laboratory  testing.  The controls stained appropriately   Assessment  Slowly improving urinary tract infection postop gallbladder fossa fluid collection  Skin separation at incision w/o cellulitis no definite bile leak  [facility] Stay = 4 days)  Plan:  Stable for discharge from surgery standpoint.  Hopefully can remove drain and switch to p.o. Augmentin.  IV ABx x5 days after drain placement = d/c 8/7 - Zosyn only w Kleb UTI & no MRSA.  Possibly Augmentin PO at d/c if sooner than 5d  Keep drain ~48 hrs.  VIR ordered a bile level - if negative, d/c drain  Pack wound once a day.  Can switch to do ribbon Nu Gauze to avoid over packing.    Pain control - still complains although notes it is better except at drain site - make tylenol QID w flexeril & oxyIR PRN breakthrough (had hallucinations w tramadol in past = made "allergy"  Physical and Occupational Therapy as tolerated her elderly deconditioned state.  She is up in chair & did walk  Some diabetes and hyperglycemia last admission but seems better now.  She came in somewhat dehydrated with elevated creatinine but that is normalizing.  Tolerating diuresis  Needs nutrition.  Tol. Heart solid diet.  Supplemental shakes with her being malnourished.  Continue H2 blockers for chronic GERD.  Maalox for breakthrough heartburn  Hypertension control  VTE  prophylaxis- SCDs, etc   Disposition:  Disposition:  The patient is from: Home  Anticipate discharge to:  Home with Home Health  Anticipated Date of Discharge is:  August 4,2022  Barriers to discharge:  Pending Clinical improvement (more likely than not)  Patient currently is NOT MEDICALLY STABLE for discharge from the hospital from a surgery standpoint.      25 minutes spent in review, evaluation, examination, counseling, and coordination of care.   I have reviewed this patient's available data, including medical history, events of note, physical examination and test results as part of my evaluation.  A significant portion of that time was spent in counseling.  Care during the described time interval was provided by me.  02/09/2021    Subjective: (Chief complaint)  Drain placed - little output Slept all night - best night Up in chair Walked in hallway w help Tol PO - not much appetite  Objective:  Vital signs:  Vitals:   02/07/21 2140 02/08/21 0615 02/08/21 2055 02/09/21 0532  BP: (!) 128/54 (!) 134/58 129/60 135/73  Pulse: 61 60 (!) 58 64  Resp: '15 15 18 17  '$ Temp: 98.3 F (36.8 C) 97.6 F (36.4 C) 97.9 F (36.6 C) 97.7 F (36.5 C)  TempSrc: Oral Oral Oral   SpO2: 97% 98% 96% 97%  Weight:      Height:        Last BM Date: 02/08/21  Intake/Output   Yesterday:  08/03 0701 - 08/04 0700 In: 1000.4 [P.O.:800; I.V.:5; IV Piggyback:195.4] Out: 10 [Drains:10] This shift:  No intake/output data recorded.  Bowel function:  Flatus: YES  BM:  No  Drain: Serous   Physical Exam:  General: Pt awakens to be alert in no acute distress.  Calm/relaxed. Eyes: PERRL, normal EOM.  Sclera clear.  No icterus Neuro: CN II-XII intact w/o focal sensory/motor deficits. Lymph: No head/neck/groin lymphadenopathy Psych:  No delerium/psychosis/paranoia.  Oriented x 4 HENT: Normocephalic, Mucus membranes moist.  No thrush Neck: Supple, No tracheal deviation.  No obvious  thyromegaly Chest: No pain to chest wall compression.  Good respiratory excursion.  No audible wheezing CV:  Pulses intact.  Regular rhythm.  No major extremity edema MS: Normal AROM mjr joints.  No obvious deformity  Abdomen: Soft.  Nondistended.  Tenderness at right upper quadrant minimal & mainly at drain site.  Midline incision closed with 15 mm separation that probes 3cm cepalad into the subcutaneous tissues.  Clean with better granulation but no dehiscence or to peritonitis..  Dressing clean dry and intact.  No evidence of peritonitis.  No incarcerated hernias.  Ext:   No deformity.  No mjr edema.  No cyanosis Skin: No petechiae / purpurea.  No major sores.  Warm and dry    Results:   Cultures: Recent Results (from the past 720 hour(s))  SARS CORONAVIRUS 2 (TAT 6-24 HRS) Nasopharyngeal Nasopharyngeal Swab     Status: None   Collection Time: 01/24/21  2:02 PM   Specimen: Nasopharyngeal Swab  Result Value Ref Range Status   SARS Coronavirus 2 NEGATIVE NEGATIVE Final    Comment: (NOTE) SARS-CoV-2 target nucleic acids are NOT DETECTED.  The SARS-CoV-2 RNA is generally detectable in upper and lower respiratory specimens during the acute phase of infection. Negative results do not preclude SARS-CoV-2 infection, do not rule out co-infections with other pathogens, and should not be used as the sole basis for treatment or other patient management decisions. Negative results must be combined with clinical observations, patient history, and epidemiological information. The expected result is Negative.  Fact Sheet for Patients: SugarRoll.be  Fact Sheet for Healthcare Providers: https://www.woods-mathews.com/  This test is not yet approved or cleared by the [place] FDA and  has been authorized for detection and/or diagnosis of SARS-CoV-2 by FDA under an Emergency Use Authorization (EUA). This EUA will remain  in effect (meaning this  test can be used) for the duration of the COVID-19 declaration under Se ction 564(b)(1) of the Act, 21 U.S.C. section 360bbb-3(b)(1), unless the authorization is terminated or revoked sooner.  Performed at [facility], [place] [address], [place], [place] [address]   Resp Panel by RT-PCR (Flu A&B, Covid) Nasopharyngeal Swab     Status: None   Collection Time: 01/28/21  7:23 PM   Specimen: Nasopharyngeal Swab; Nasopharyngeal(NP) swabs in vial transport medium  Result Value Ref Range Status   SARS Coronavirus 2 by RT PCR NEGATIVE NEGATIVE Final    Comment: (NOTE) SARS-CoV-2 target nucleic acids are NOT DETECTED.  The SARS-CoV-2 RNA is generally detectable in upper respiratory specimens during the acute phase of infection. The lowest concentration of SARS-CoV-2 viral copies this assay can detect is 138 copies/mL. A negative result does not preclude SARS-Cov-2 infection and should not be used as the sole basis for treatment or other patient management decisions. A negative result may occur with  improper specimen collection/handling, submission of specimen other than nasopharyngeal swab, presence of viral mutation(s) within the areas targeted by this assay, and inadequate number of viral copies(<138 copies/mL). A negative result must be combined with clinical observations, patient history, and epidemiological  information. The expected result is Negative.  Fact Sheet for Patients:  EntrepreneurPulse.com.au  Fact Sheet for Healthcare Providers:  IncredibleEmployment.be  This test is no t yet approved or cleared by the [place] FDA and  has been authorized for detection and/or diagnosis of SARS-CoV-2 by FDA under an Emergency Use Authorization (EUA). This EUA will remain  in effect (meaning this test can be used) for the duration of the COVID-19 declaration under Section 564(b)(1) of the Act, 21 U.S.C.section 360bbb-3(b)(1), unless the  authorization is terminated  or revoked sooner.       Influenza A by PCR NEGATIVE NEGATIVE Final   Influenza B by PCR NEGATIVE NEGATIVE Final    Comment: (NOTE) The Xpert Xpress SARS-CoV-2/FLU/RSV plus assay is intended as an aid in the diagnosis of influenza from Nasopharyngeal swab specimens and should not be used as a sole basis for treatment. Nasal washings and aspirates are unacceptable for Xpert Xpress SARS-CoV-2/FLU/RSV testing.  Fact Sheet for Patients: EntrepreneurPulse.com.au  Fact Sheet for Healthcare Providers: IncredibleEmployment.be  This test is not yet approved or cleared by the [place] FDA and has been authorized for detection and/or diagnosis of SARS-CoV-2 by FDA under an Emergency Use Authorization (EUA). This EUA will remain in effect (meaning this test can be used) for the duration of the COVID-19 declaration under Section 564(b)(1) of the Act, 21 U.S.C. section 360bbb-3(b)(1), unless the authorization is terminated or revoked.  Performed at [facility], [place] [address], [place], [place] [address]   Urine Culture     Status: Abnormal   Collection Time: 01/31/21 12:49 PM   Specimen: Urine, Clean Catch  Result Value Ref Range Status   Specimen Description   Final    URINE, CLEAN CATCH Performed at [facility], [place] [address] [place], [place] [address]    Special Requests   Final    NONE Performed at [facility], [place] [address] [place], [place] [address]    Culture >=100,000 COLONIES/mL KLEBSIELLA OXYTOCA (A)  Final   Report Status 02/02/2021 FINAL  Final   Organism ID, Bacteria KLEBSIELLA OXYTOCA (A)  Final      Susceptibility   Klebsiella oxytoca - MIC*    AMPICILLIN >=32 RESISTANT Resistant     CEFAZOLIN 8 SENSITIVE Sensitive     CEFEPIME <=0.12 SENSITIVE Sensitive     CEFTRIAXONE <=0.25 SENSITIVE Sensitive     CIPROFLOXACIN <=0.25 SENSITIVE  Sensitive     GENTAMICIN <=1 SENSITIVE Sensitive     IMIPENEM <=0.25 SENSITIVE Sensitive     NITROFURANTOIN <=16 SENSITIVE Sensitive     TRIMETH/SULFA <=20 SENSITIVE Sensitive     AMPICILLIN/SULBACTAM 16 INTERMEDIATE Intermediate     PIP/TAZO <=4 SENSITIVE Sensitive     * >=100,000 COLONIES/mL KLEBSIELLA OXYTOCA  Culture, blood (routine x 2)     Status: None (Preliminary result)   Collection Time: 02/04/21  9:30 PM   Specimen: BLOOD  Result Value Ref Range Status   Specimen Description   Final    BLOOD SITE NOT SPECIFIED Performed at [place] [address], [place], [place] [address]    Special Requests   Final    BOTTLES DRAWN AEROBIC AND ANAEROBIC Blood Culture adequate volume Performed at [place] [address], [place], [place] [address]    Culture  Setup Time   Final    GRAM POSITIVE RODS ANAEROBIC BOTTLE ONLY CRITICAL RESULT CALLED TO, READ BACK BY AND VERIFIED WITH: m. lilliston PHARMD,  AT [id number] 02/07/21 [place]    Culture   Final    Lonell Grandchild POSITIVE RODS IDENTIFICATION TO FOLLOW Performed at [facility], [place] [address], [place], [place] [address]    Report Status PENDING  Incomplete  Resp Panel by RT-PCR (Flu A&B, Covid) Nasopharyngeal Swab     Status: None   Collection Time: 02/04/21  9:30 PM   Specimen: Nasopharyngeal Swab; Nasopharyngeal(NP) swabs in vial transport medium  Result Value Ref Range Status   SARS Coronavirus 2 by RT PCR NEGATIVE NEGATIVE Final    Comment: (NOTE) SARS-CoV-2 target nucleic acids are NOT DETECTED.  The SARS-CoV-2 RNA is generally detectable in upper respiratory specimens during the acute phase of infection. The lowest concentration of SARS-CoV-2 viral copies this assay can detect is 138 copies/mL. A negative result does not preclude SARS-Cov-2 infection and should not be used as the sole basis for treatment or other patient management decisions. A negative result may occur with   improper specimen collection/handling, submission of specimen other than nasopharyngeal swab, presence of viral mutation(s) within the areas targeted by this assay, and inadequate number of viral copies(<138 copies/mL). A negative result must be combined with clinical observations, patient history, and epidemiological information. The expected result is Negative.  Fact Sheet for Patients:  EntrepreneurPulse.com.au  Fact Sheet for Healthcare Providers:  IncredibleEmployment.be  This test is no t yet approved or cleared by the [place] FDA and  has been authorized for detection and/or diagnosis of SARS-CoV-2 by FDA under an Emergency Use Authorization (EUA). This EUA will remain  in effect (meaning this test can be used) for the duration of the COVID-19 declaration under Section 564(b)(1) of the Act, 21 U.S.C.section 360bbb-3(b)(1), unless the authorization is terminated  or revoked sooner.       Influenza A by PCR NEGATIVE NEGATIVE Final   Influenza B by PCR NEGATIVE NEGATIVE Final    Comment: (NOTE) The Xpert Xpress SARS-CoV-2/FLU/RSV plus assay is intended as an aid in the diagnosis of influenza from Nasopharyngeal swab specimens and should not be used as a sole basis for treatment. Nasal washings and aspirates are unacceptable for Xpert Xpress SARS-CoV-2/FLU/RSV testing.  Fact Sheet for Patients: EntrepreneurPulse.com.au  Fact Sheet for Healthcare Providers: IncredibleEmployment.be  This test is not yet approved or cleared by the [place] FDA and has been authorized for detection and/or diagnosis of SARS-CoV-2 by FDA under an Emergency Use Authorization (EUA). This EUA will remain in effect (meaning this test can be used) for the duration of the COVID-19 declaration under Section 564(b)(1) of the Act, 21 U.S.C. section 360bbb-3(b)(1), unless the authorization is terminated  or revoked.  Performed at [facility], [place] [address], [place], [place] [address]   Urine Culture     Status: Abnormal   Collection Time: 02/05/21  2:06 AM   Specimen: Urine, Clean Catch  Result Value Ref Range Status   Specimen Description   Final    URINE, CLEAN CATCH Performed at [facility], [place] [address], [place], [place] [address]    Special Requests   Final    NONE Performed at [facility], [place] [address], [place], [place] [address]    Culture (A)  Final    <10,000 COLONIES/mL INSIGNIFICANT GROWTH Performed at [place] [address] [place], [place] [address]    Report Status 02/06/2021 FINAL  Final  Culture, blood (routine x 2)     Status: None (Preliminary result)  Collection Time: 02/05/21  5:17 AM   Specimen: BLOOD  Result Value Ref Range Status   Specimen Description   Final    BLOOD RIGHT ANTECUBITAL Performed at [place] [address], [place], [place] [address]    Special Requests   Final    BOTTLES DRAWN AEROBIC ONLY Blood Culture adequate volume Performed at [place] [address], [place], [place] [address]    Culture   Final    NO GROWTH 3 DAYS Performed at [facility], [place] [address] [place], [place] [address]    Report Status PENDING  Incomplete  Aerobic/Anaerobic Culture w Gram Stain (surgical/deep wound)     Status: None (Preliminary result)   Collection Time: 02/07/21  1:07 PM   Specimen: Abscess  Result Value Ref Range Status   Specimen Description   Final    ABSCESS RIGHT UPPER QUADRANT Performed at [place] [address] [place], [place] [address]    Special Requests   Final    NONE Performed at [facility], [place] [address] [place], [place] [address]    Gram Stain   Final    ABUNDANT WBC PRESENT, PREDOMINANTLY PMN FEW GRAM POSITIVE RODS    Culture   Final     CULTURE REINCUBATED FOR BETTER GROWTH Performed at [facility], [place] [address] [place], [place] [address]    Report Status PENDING  Incomplete  Culture, blood (routine x 2)     Status: None (Preliminary result)   Collection Time: 02/07/21  3:07 PM   Specimen: Right Antecubital; Blood  Result Value Ref Range Status   Specimen Description   Final    RIGHT ANTECUBITAL Performed at [place] [address], [place], [place] [address]    Special Requests   Final    BOTTLES DRAWN AEROBIC ONLY Blood Culture adequate volume Performed at [place] [address], [place], [place] [address]    Culture   Final    NO GROWTH < 24 HOURS Performed at [place] [address] [place], [place] [address]    Report Status PENDING  Incomplete  Culture, blood (routine x 2)     Status: None (Preliminary result)   Collection Time: 02/07/21  3:15 PM   Specimen: BLOOD LEFT WRIST  Result Value Ref Range Status   Specimen Description   Final    BLOOD LEFT WRIST Performed at [place] [address] [place], [place] [address]    Special Requests   Final    BOTTLES DRAWN AEROBIC ONLY Blood Culture adequate volume Performed at [place] [address] [place], [place] [address]    Culture   Final    NO GROWTH < 24 HOURS Performed at [place] [address], [place], [place] [address]    Report Status PENDING  Incomplete    Labs: Results for orders placed or performed during the hospital encounter of 02/04/21 (from the past 48 hour(s))  Aerobic/Anaerobic Culture w Gram Stain (surgical/deep wound)     Status: None (Preliminary result)   Collection Time: 02/07/21  1:07 PM   Specimen: Abscess  Result Value Ref Range   Specimen Description      ABSCESS RIGHT UPPER QUADRANT Performed at [place] [address] [place],  [address]    Special Requests       NONE Performed  at [facility], [place] [address], [place], [place] [address]    Gram Stain      ABUNDANT WBC PRESENT, PREDOMINANTLY PMN FEW GRAM POSITIVE RODS    Culture      CULTURE REINCUBATED FOR BETTER GROWTH Performed at [facility], [place] [address], [place], [place] [address]    Report Status PENDING   Culture, blood (routine x 2)     Status: None (Preliminary result)   Collection Time: 02/07/21  3:07 PM   Specimen: Right Antecubital; Blood  Result Value Ref Range   Specimen Description      RIGHT ANTECUBITAL Performed at [place] [address], [place], [place] [address]    Special Requests      BOTTLES DRAWN AEROBIC ONLY Blood Culture adequate volume Performed at [place] [address] [place], [place] [address]    Culture      NO GROWTH < 24 HOURS Performed at [place] [address] [place], [place] [address]    Report Status PENDING   Culture, blood (routine x 2)     Status: None (Preliminary result)   Collection Time: 02/07/21  3:15 PM   Specimen: BLOOD LEFT WRIST  Result Value Ref Range   Specimen Description      BLOOD LEFT WRIST Performed at [place] [address] [place], [place] [address]    Special Requests      BOTTLES DRAWN AEROBIC ONLY Blood Culture adequate volume Performed at [place] [address], [place], [place] [address]    Culture      NO GROWTH < 24 HOURS Performed at [place] [address], [place], [place] [address]    Report Status PENDING   Brain natriuretic peptide     Status: Abnormal   Collection Time: 02/08/21  4:17 AM  Result Value Ref Range   B Natriuretic Peptide 428.0 (H) 0.0 - 100.0 pg/mL    Comment: Performed at [facility], [place] [address] [place], [place] [address]  Comprehensive metabolic panel     Status: Abnormal   Collection Time: 02/08/21   4:19 AM  Result Value Ref Range   Sodium 135 135 - 145 mmol/L   Potassium 4.9 3.5 - 5.1 mmol/L   Chloride 107 98 - 111 mmol/L   CO2 21 (L) 22 - 32 mmol/L   Glucose, Bld 91 70 - 99 mg/dL    Comment: Glucose reference range applies only to samples taken after fasting for at least 8 hours.   BUN 18 8 - 23 mg/dL   Creatinine, Ser 0.74 0.44 - 1.00 mg/dL   Calcium 8.5 (L) 8.9 - 10.3 mg/dL   Total Protein 5.5 (L) 6.5 - 8.1 g/dL   Albumin 2.6 (L) 3.5 - 5.0 g/dL   AST 27 15 - 41 U/L   ALT 18 0 - 44 U/L   Alkaline Phosphatase 114 38 - 126 U/L   Total Bilirubin 0.7 0.3 - 1.2 mg/dL   GFR, Estimated >60 >60 mL/min    Comment: (NOTE) Calculated using the CKD-EPI Creatinine Equation (2021)    Anion gap 7 5 - 15    Comment: Performed at [facility], [place] [address], [place], [place] [address]  CBC     Status: Abnormal   Collection Time: 02/08/21  4:19 AM  Result Value Ref Range   WBC 7.9 4.0 - 10.5 K/uL  RBC 3.05 (L) 3.87 - 5.11 MIL/uL   Hemoglobin 9.2 (L) 12.0 - 15.0 g/dL   HCT 28.4 (L) 36.0 - 46.0 %   MCV 93.1 80.0 - 100.0 fL   MCH 30.2 26.0 - 34.0 pg   MCHC 32.4 30.0 - 36.0 g/dL   RDW 13.2 11.5 - 15.5 %   Platelets 191 150 - 400 K/uL   nRBC 0.0 0.0 - 0.2 %    Comment: Performed at [facility], [place] [address], [place], [place] [address]  CBC     Status: Abnormal   Collection Time: 02/09/21  4:32 AM  Result Value Ref Range   WBC 5.8 4.0 - 10.5 K/uL   RBC 3.33 (L) 3.87 - 5.11 MIL/uL   Hemoglobin 10.0 (L) 12.0 - 15.0 g/dL   HCT 31.0 (L) 36.0 - 46.0 %   MCV 93.1 80.0 - 100.0 fL   MCH 30.0 26.0 - 34.0 pg   MCHC 32.3 30.0 - 36.0 g/dL   RDW 13.2 11.5 - 15.5 %   Platelets 214 150 - 400 K/uL   nRBC 0.0 0.0 - 0.2 %    Comment: Performed at [facility], [place] [address], [place], [place] [id number]  Basic metabolic panel     Status: Abnormal   Collection Time: 02/09/21  4:32 AM  Result Value Ref Range   Sodium 138 135 - 145 mmol/L    Potassium 4.4 3.5 - 5.1 mmol/L   Chloride 105 98 - 111 mmol/L   CO2 23 22 - 32 mmol/L   Glucose, Bld 88 70 - 99 mg/dL    Comment: Glucose reference range applies only to samples taken after fasting for at least 8 hours.   BUN 17 8 - 23 mg/dL   Creatinine, Ser 0.97 0.44 - 1.00 mg/dL   Calcium 8.6 (L) 8.9 - 10.3 mg/dL   GFR, Estimated 58 (L) >60 mL/min    Comment: (NOTE) Calculated using the CKD-EPI Creatinine Equation (2021)    Anion gap 10 5 - 15    Comment: Performed at [facility], [place] [address] [place], [place] [address]    Imaging / Studies: CT IMAGE GUIDED DRAINAGE BY PERCUTANEOUS CATHETER  Result Date: 02/07/2021 INDICATION: 86 year old with a postoperative fluid collection at gallbladder fossa. EXAM: CT-GUIDED DRAIN PLACEMENT AT GALLBLADDER FOSSA MEDICATIONS: Moderate sedation ANESTHESIA/SEDATION: Fentanyl 50 mcg IV; Versed 1.0 mg IV Moderate Sedation Time:  20 minutes The patient was continuously monitored during the procedure by the interventional radiology nurse under my direct supervision. COMPLICATIONS: None immediate. PROCEDURE: Informed written consent was obtained from the patient after a thorough discussion of the procedural risks, benefits and alternatives. All questions were addressed. A timeout was performed prior to the initiation of the procedure. Patient was placed supine on the CT scanner. Images through the abdomen were obtained. The air-fluid collection in the gallbladder fossa was identified and targeted. The right upper abdomen was prepped with chlorhexidine and sterile field was created. Maximal barrier sterile technique was utilized including caps, mask, sterile gowns, sterile gloves, sterile drape, hand hygiene and skin antiseptic. Skin was anesthetized using 1% lidocaine. Small incision was made. Using CT guidance, an 18 gauge trocar needle was directed into the gallbladder fossa fluid collection. Pink purulent fluid was aspirated. A  superstiff Amplatz wire was advanced into the collection and the tract was dilated to accommodate a 10 [place] multipurpose drain. Drain was advanced into the collection and follow up CT images were obtained. Approximately 15  mL of purulent fluid was aspirated. Fluid was sent for culture. Catheter was sutured to skin and attached to a suction bulb. FINDINGS: Air-fluid collection in the gallbladder fossa. Collection was decompressed following drain placement. 15 mL of pink opaque fluid was removed. IMPRESSION: CT-guided placement of a drainage catheter within the gallbladder fossa fluid collection. Electronically Signed   By: Markus Daft M.D.   On: 02/07/2021 15:53    Medications / Allergies: per chart  Antibiotics: Anti-infectives (From admission, onward)    Start     Dose/Rate Route Frequency Ordered Stop   02/08/21 1000  piperacillin-tazobactam (ZOSYN) IVPB 3.375 g        3.375 g 12.5 mL/hr over 240 Minutes Intravenous Every 8 hours 02/08/21 0854 02/12/21 1000   02/07/21 2200  vancomycin (VANCOCIN) IVPB 1000 mg/200 mL premix  Status:  Discontinued        1,000 mg 200 mL/hr over 60 Minutes Intravenous Every 24 hours 02/07/21 1400 02/08/21 0850   02/07/21 1415  ceFEPIme (MAXIPIME) 2 g in sodium chloride 0.9 % 100 mL IVPB  Status:  Discontinued        2 g 200 mL/hr over 30 Minutes Intravenous Every 12 hours 02/07/21 1400 02/08/21 0850   02/06/21 0200  vancomycin (VANCOREADY) IVPB 1250 mg/250 mL  Status:  Discontinued        1,250 mg 166.7 mL/hr over 90 Minutes Intravenous Every 24 hours 02/05/21 0227 02/07/21 1359   02/05/21 0030  vancomycin (VANCOCIN) IVPB 1000 mg/200 mL premix        1,000 mg 200 mL/hr over 60 Minutes Intravenous  Once 02/05/21 0015 02/05/21 0535   02/05/21 0030  ceFEPIme (MAXIPIME) 2 g in sodium chloride 0.9 % 100 mL IVPB  Status:  Discontinued        2 g 200 mL/hr over 30 Minutes Intravenous Every 24 hours 02/05/21 0015 02/07/21 1359         Note: Portions of this  report may have been transcribed using voice recognition software. Every effort was made to ensure accuracy; however, inadvertent computerized transcription errors may be present.   Any transcriptional errors that result from this process are unintentional.    [name], MD, FACS, MASCRS Esophageal, Gastrointestinal & Colorectal Surgery Robotic and Minimally Invasive Surgery  [facility], [place]  [place]. [address] [place], [place] [address] [phone] Fax [phone] Main  CONTACT INFORMATION:  Weekday (9AM-5PM): Call [facility] main office at [phone]  Weeknight (5PM-9AM) or Weekend/Holiday: Check [email or web address] (password " [email or web address]") for General Surgery [facility] coverage  (Please, do not use SecureChat as it is not reliable communication to operating surgeons for immediate patient care)      02/09/2021  7:56 AM

## 2021-02-09 NOTE — Progress Notes (Signed)
   02/09/21 1200  Mobility  Level of Assistance Modified independent, requires aide device or extra time  Assistive Device Front wheel walker  Distance Ambulated (ft) 600 ft  Mobility Ambulated with assistance in hallway  Mobility Response Tolerated well  Mobility performed by Mobility specialist  $Mobility charge 1 Mobility   Pt ambulated in the hallway ~657f with RW. She tolerated well, with no complaints. Pt left in chair with call bell at side.   [facility] Office: [phone]

## 2021-02-10 DIAGNOSIS — I10 Essential (primary) hypertension: Secondary | ICD-10-CM | POA: Diagnosis not present

## 2021-02-10 DIAGNOSIS — N179 Acute kidney failure, unspecified: Secondary | ICD-10-CM | POA: Diagnosis not present

## 2021-02-10 DIAGNOSIS — L0390 Cellulitis, unspecified: Secondary | ICD-10-CM | POA: Diagnosis not present

## 2021-02-10 DIAGNOSIS — I5032 Chronic diastolic (congestive) heart failure: Secondary | ICD-10-CM | POA: Diagnosis not present

## 2021-02-10 LAB — CBC
HCT: 32.7 % — ABNORMAL LOW (ref 36.0–46.0)
Hemoglobin: 10.4 g/dL — ABNORMAL LOW (ref 12.0–15.0)
MCH: 29.3 pg (ref 26.0–34.0)
MCHC: 31.8 g/dL (ref 30.0–36.0)
MCV: 92.1 fL (ref 80.0–100.0)
Platelets: 254 10*3/uL (ref 150–400)
RBC: 3.55 MIL/uL — ABNORMAL LOW (ref 3.87–5.11)
RDW: 13.1 % (ref 11.5–15.5)
WBC: 5.7 10*3/uL (ref 4.0–10.5)
nRBC: 0 % (ref 0.0–0.2)

## 2021-02-10 LAB — BASIC METABOLIC PANEL
Anion gap: 10 (ref 5–15)
BUN: 13 mg/dL (ref 8–23)
CO2: 22 mmol/L (ref 22–32)
Calcium: 8.6 mg/dL — ABNORMAL LOW (ref 8.9–10.3)
Chloride: 108 mmol/L (ref 98–111)
Creatinine, Ser: 0.88 mg/dL (ref 0.44–1.00)
GFR, Estimated: >60 mL/min (ref >60)
Glucose, Bld: 85 mg/dL (ref 70–99)
Potassium: 4.1 mmol/L (ref 3.5–5.1)
Sodium: 140 mmol/L (ref 135–145)

## 2021-02-10 LAB — CULTURE, BLOOD (ROUTINE X 2)
Culture: NO GROWTH
Special Requests: ADEQUATE
Special Requests: ADEQUATE

## 2021-02-10 LAB — TOTAL BILIRUBIN, BODY FLUID

## 2021-02-10 MED ORDER — OXYCODONE HCL 5 MG PO TABS: 5.0000 mg | ORAL_TABLET | Freq: Three times a day (TID) | ORAL | 0 refills | Status: DC | PRN

## 2021-02-10 MED ORDER — GLUCERNA SHAKE PO LIQD: 237.0000 mL | Freq: Two times a day (BID) | ORAL | 0 refills | Status: DC

## 2021-02-10 MED ORDER — AMOXICILLIN-POT CLAVULANATE 875-125 MG PO TABS: 1.0000 | ORAL_TABLET | Freq: Two times a day (BID) | ORAL | 0 refills | Status: AC

## 2021-02-10 MED ORDER — OMEPRAZOLE 20 MG PO CPDR: 20.0000 mg | DELAYED_RELEASE_CAPSULE | Freq: Every morning | ORAL | Status: DC

## 2021-02-10 NOTE — Discharge Summary (Signed)
Discharge Summary  Heather [name] ZOX:[id number] DOB: 02/07/1935  PCP: Mosie Lukes, MD  Admit date: 02/04/2021 Discharge date: 02/10/2021  Time spent: 35 minutes  Recommendations for Outpatient Follow-up:  Patient will follow up with general surgery in 3 weeks. Patient will go home with home health nursing and PT New medication: Augmentin 875 p.o. twice daily x3 days New medication: Oxy IR 5 mg p.o. every 6 hours as needed for pain Wound care: Patient will pack her wound with nu ribbon once a day. Patient advised at this time to hold her Toprol.  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 11/15/2011   DM (diabetes mellitus) ([place]) 10/06/2011   Coronary atherosclerosis 01/18/2009   Essential hypertension 04/14/2007    Resolved Hospital Problems   Diagnosis Date Noted Date Resolved   Sepsis due to cellulitis [facility]) 02/05/2021 02/08/2021    Discharge Condition: Improved, being discharged home  Diet recommendation: Heart healthy  Vitals:   02/09/21 2050 02/10/21 0531  BP: 132/62 (!) 147/65  Pulse: 64 63  Resp: 16 16  Temp: 97.8 F (36.6 C) 98.4 F (36.9 C)  SpO2: 95% 96%    History of present illness:  86 year old female with past medical history of CAD, DVT, diabetes mellitus, chronic diastolic CHF and recent lap chole admitted on 7/30 for sepsis secondary to cellulitis around incision site.Started on fluids and antibiotics  and admitted to the hospitalist service.  Hospital Course:  Sepsis secondary to cellulitis at postop umbilical incision site: Patient met criteria for sepsis on admission given fever and leukocytosis.  Has been on vancomycin and cefepime.  HIDA scan negative.  Status post drain placement by interventional radiology 8/2.  Patient tolerated the drain well with good output.  On 8/5, general surgery removed drain.  Patient changed over to p.o. Augmentin for discharge which she will continue until 8/7 for total of 5 days of antibiotic therapy post drain placement.  She will follow-up with general surgery as outpatient.   Acute kidney injury: 1.44 on admission.  Responded to IV fluids and now normalized.   Hypertension: Initially blood pressure soft due to sepsis.  Imdur restarted.  Blood pressure by time of discharge in the 811B systolic so okay for her to resume her as needed Lasix as well as Norvasc.  Heart rate has remained in the high 50s, low 60s.  Advised patient to hold her Toprol XL 90 mg daily until she sees her primary care physician.  At that time her heart rate and blood pressure can be reassessed and decision can be made to restart this.   Acute on chronic diastolic heart failure: In response to fluid resuscitation.  Fluids stopped on 8/3 once sepsis stabilized.  Status post 2 doses of Lasix.   Restless leg syndrome: Continue pramiprexole.   Left posterior vitreous detachment: Patient had noted floaters.  Ophthalmology evaluated patient and found posterior vitreous detachment with recommendation for repeated dilated exam in 1 month.  Consultants: General surgery Interventional radiology Ophthalmology  Procedures: HIDA scan: Normal Drain placement  Discharge Exam: BP (!) 147/65 (BP Location: Right Arm)   Pulse 63   Temp 98.4 F (36.9 C) (Oral)   Resp 16   Ht _0  (1.575 m)   Wt 68.9 kg   LMP  (LMP Unknown)   SpO2 96%   BMI 27.80 kg/m   General: Alert and oriented x3, no  acute distress Cardiovascular: Regular rate and rhythm, S1-S2 Respiratory: Clear to auscultation bilaterally  Discharge Instructions You were cared for by a hospitalist during your hospital stay. If you have any questions about your discharge medications or the care you received while you were in the hospital after you are discharged, you can call the unit and asked to speak with the hospitalist on call if the hospitalist that took care of you is not available. Once you are discharged, your primary care physician will handle any further medical issues. Please note that NO REFILLS for any discharge medications will be authorized once you are discharged, as it is imperative that you return to your primary care physician (or establish a relationship with a primary care physician if you do not have one) for your aftercare needs so that they can reassess your need for medications and monitor your lab values.   Allergies as of 02/10/2021       Reactions   Baclofen Other (See Comments)   Hyperactivity   Nitrofurantoin Rash   Tramadol Other (See Comments)   Hallucinations        Medication List     STOP taking these medications    cefdinir 300 MG capsule Commonly known as: OMNICEF   HYDROcodone-acetaminophen 5-325 MG tablet Commonly known as: NORCO/VICODIN   metoprolol succinate 50 MG 24 hr tablet Commonly known as: TOPROL-XL   sulfamethoxazole-trimethoprim 800-160 MG tablet Commonly known as: BACTRIM DS       TAKE these medications    acetaminophen 500 MG tablet Commonly known as: TYLENOL Take 1,000 mg by mouth every 6 (six) hours as needed for moderate pain.   amLODipine 10 MG tablet Commonly known as: NORVASC TAKE 1 TABLET EVERY DAY   amoxicillin-clavulanate 875-125 MG tablet Commonly known as: Augmentin Take 1 tablet by mouth 2 (two) times daily for 3 days.   aspirin EC 81 MG tablet Take 1 tablet (81 mg total) by mouth daily. What changed: when to take this    diclofenac Sodium 1 % Gel Commonly known as: VOLTAREN Apply 1 application topically 4 (four) times daily as needed (hip pain).   famotidine 20 MG tablet Commonly known as: PEPCID Take 20 mg by mouth at bedtime as needed for heartburn or indigestion.   feeding supplement (GLUCERNA SHAKE) Liqd Take 237 mLs by mouth 2 (two) times daily between meals.   furosemide 20 MG tablet Commonly known as: Lasix Take 1 tablet (20 mg total) by mouth daily as needed for edema (weight gain greater than 3 pounds in 1 day or 5 pounds in 1 week).   isosorbide mononitrate 60 MG 24 hr tablet Commonly known as: IMDUR Take 1.5 tablets (90 mg total) by mouth daily. What changed: when to take this   ketorolac 10 MG tablet Commonly known as: TORADOL Take 1 tablet (10 mg total) by mouth every 6 (six) hours as needed. What changed: reasons to take this   magnesium oxide 400 MG tablet Commonly known as: MAG-OX Take 400 mg by mouth at bedtime.   mirabegron ER 25 MG Tb24 tablet Commonly known as: MYRBETRIQ  Take 1 tablet (25 mg total) by mouth daily.   nitroGLYCERIN 0.4 MG SL tablet Commonly known as: NITROSTAT Place 1 tablet (0.4 mg total) under the tongue every 5 (five) minutes as needed for chest pain. What changed: when to take this   omeprazole 20 MG capsule Commonly known as: PRILOSEC Take 1 capsule (20 mg total) by mouth in the morning. What changed: See the new instructions.   ondansetron 4 MG disintegrating tablet Commonly known as: Zofran ODT Take 1 tablet (4 mg total) by mouth every 6 (six) hours as needed for nausea or vomiting.   oxyCODONE 5 MG immediate release tablet Commonly known as: Oxy IR/ROXICODONE Take 1-2 tablets (5-10 mg total) by mouth every 8 (eight) hours as needed for moderate pain or severe pain.   potassium chloride SA 20 MEQ tablet Commonly known as: KLOR-CON Take 0.5 tablets (10 mEq total) by mouth daily. What changed: when to take this   pramipexole 0.25 MG  tablet Commonly known as: MIRAPEX Take 1 tablet (0.25 mg total) by mouth 2 (two) times daily.   simvastatin 20 MG tablet Commonly known as: ZOCOR TAKE 1 TABLET AT BEDTIME What changed: when to take this   True Metrix Air Glucose Meter w/Device Kit USE TO CHECK BLOOD SUGAR ONCE DAILY AND AS NEEDED.  DX CODE E11.9   True Metrix Blood Glucose Test test strip Generic drug: glucose blood USE TO CHECK BLOOD SUGAR ONCE A DAY OR AS NEEDED.  DX CODE: E11.9   TRUEplus Lancets 33G Misc USE TO CHECK BLOOD SUGAR ONCE DAILY AND AS NEEDED.  DX CODE: E11.9   Vitamin C 500 MG Chew Chew 500 mg by mouth daily.   Vitamin D 50 MCG (2000 UT) Caps Take 2,000 Units by mouth daily.   Zinc 50 MG Tabs Take 50 mg by mouth daily.       Allergies  Allergen Reactions   Baclofen Other (See Comments)    Hyperactivity    Nitrofurantoin Rash   Tramadol Other (See Comments)    Hallucinations     Follow-up Information     Michael Boston, MD. Schedule an appointment as soon as possible for a visit in 3 week(s).   Specialties: General Surgery, Colon and Rectal Surgery Why: To follow up after your operation & check your incision wound Contact information: [place] [address] [phone]                  The results of significant diagnostics from this hospitalization (including imaging, microbiology, ancillary and laboratory) are listed below for reference.    Significant Diagnostic Studies: DG Chest 2 View  Result Date: 01/28/2021 CLINICAL DATA:  Cough. Hypoxia. Abdominal pain, status post cholecystectomy and hernia repair 01/25/2021. EXAM: CHEST - 2 VIEW COMPARISON:  09/05/2020 FINDINGS: Mild blunting of both costophrenic angles. Linear opacities at the lung bases favoring subsegmental atelectasis. The patient is rotated to the right on today's radiograph, reducing diagnostic sensitivity and specificity. No appreciable pneumothorax. Mild thoracic kyphosis.  Atherosclerotic calcification of the aortic arch. IMPRESSION: 1. Mild bibasilar atelectasis with blunting of the costophrenic angle suggesting small bilateral pleural effusions. No edema. 2.  Aortic Atherosclerosis (ICD10-I70.0). Electronically Signed   By: Van Clines M.D.   On: 01/28/2021 18:30   DG Cholangiogram Operative  Result Date: 01/25/2021 CLINICAL DATA:  Intraoperative cholangiogram EXAM: INTRAOPERATIVE CHOLANGIOGRAM TECHNIQUE: Cholangiographic images from the C-arm fluoroscopic device were submitted for interpretation post-operatively. Please see the procedural report for the  amount of contrast and the fluoroscopy time utilized. COMPARISON:  CT abdomen/pelvis 11/24/2020 FINDINGS: Intraoperative cine clips are submitted for review. The images demonstrate cannulation of the cystic duct remanent with intraoperative cholangiogram. No evidence of biliary ductal dilatation, stenosis or stricture. No evidence of filling defect to suggest choledocholithiasis. Contrast material passes through the ampulla and into the duodenum. IMPRESSION: Negative intraoperative cholangiogram. Electronically Signed   By: Jacqulynn Cadet M.D.   On: 01/25/2021 15:51   CT HEAD WO CONTRAST  Result Date: 02/05/2021 CLINICAL DATA:  Transient ischemic attack (TIA). Additional history provided: Patient reports "floaters" in eyes. EXAM: CT HEAD WITHOUT CONTRAST TECHNIQUE: Contiguous axial images were obtained from the base of the skull through the vertex without intravenous contrast. COMPARISON:  Brain MRI 08/13/2017.  Head CT 04/01/2013. FINDINGS: Brain: Mild generalized parenchymal atrophy. Mild-to-moderate patchy and ill-defined hypoattenuation within the cerebral white matter, nonspecific but compatible with chronic small vessel ischemic disease. Bilateral basal ganglia mineralization. There is no acute intracranial hemorrhage. No demarcated cortical infarct. No extra-axial fluid collection. No evidence of an  intracranial mass. No midline shift. Vascular: No hyperdense vessel.  Atherosclerotic calcifications Skull: Normal. Negative for fracture or focal lesion. Sinuses/Orbits: Visualized orbits show no acute finding. No significant paranasal sinus disease at the imaged levels. IMPRESSION: No evidence of acute intracranial abnormality. Mild-to-moderate chronic small vessel ischemic changes within the cerebral white matter, progressed from the head CT of 04/01/2013. Mild generalized parenchymal atrophy. Electronically Signed   By: Kellie Simmering DO   On: 02/05/2021 13:18   CT Angio Chest PE W/Cm &/Or Wo Cm  Addendum Date: 01/28/2021   ADDENDUM REPORT: 01/28/2021 21:54 ADDENDUM: These results were called by telephone at the time of interpretation on 01/28/2021 at 9:54 pm to provider Benay Pike MD, who verbally acknowledged these results. Electronically Signed   By: Lovena Le M.D.   On: 01/28/2021 21:54   Result Date: 01/28/2021 CLINICAL DATA:  Laparoscopic cholecystectomy and conventional ventral hernia repair performed 01/25/2021, now with shortness of breath and hypoxia. EXAM: CT ANGIOGRAPHY CHEST CT ABDOMEN AND PELVIS WITH CONTRAST TECHNIQUE: Multidetector CT imaging of the chest was performed using the standard protocol during bolus administration of intravenous contrast. Multiplanar CT image reconstructions and MIPs were obtained to evaluate the vascular anatomy. Multidetector CT imaging of the abdomen and pelvis was performed using the standard protocol during bolus administration of intravenous contrast. CONTRAST:  59m OMNIPAQUE IOHEXOL 350 MG/ML SOLN COMPARISON:  CT abdomen pelvis 11/24/2020, cholangiogram 01/25/2021, CT chest 08/27/2020 FINDINGS: CTA CHEST FINDINGS Cardiovascular: Fall there is satisfactory opacification of the pulmonary arteries. Exam quality is diminished by extensive cardiac pulsation and respiratory motion artifact throughout the lungs. No large central or lobar filling defects are  convincingly demonstrated. More distal evaluation is largely precluded by technical factors. Top-normal cardiac size. Three-vessel coronary artery atherosclerosis. No pericardial effusion. Cardiac pulsation artifact results in suboptimal assessment of the aortic root and ascending thoracic aorta. Mediastinum/Nodes: Few scattered small subcentimeter hypoattenuating nodules in the thyroid gland are unchanged from comparison prior and unlikely to be clinically significant. Thoracic inlet is otherwise unremarkable. Moderate hiatal hernia. Fluid-filled distal thoracic esophagus. Tracheal and central airways are unremarkable. No concerning mediastinal, hilar or axillary adenopathy. Lungs/Pleura: Small right trace left pleural effusions with some adjacent areas of passive atelectasis and additional dependent atelectatic features posteriorly. Some clustered nodules seen in the anterior lingula are new region of previously seen consolidation could reflect some postinflammatory change. No other focal airspace opacities are seen. Mild interlobular septal thickening could  reflect some mild interstitial edema. Musculoskeletal: Exaggerated thoracic kyphosis. Multilevel degenerative changes are present in the imaged portions of the spine. Additional degenerative changes in the bilateral shoulders. No acute or worrisome chest wall mass or lesion. Review of the MIP images confirms the above findings. CT ABDOMEN and PELVIS FINDINGS Hepatobiliary: Normal hepatic attenuation. No concerning focal liver lesion. Question some micro lobulations along the anterior left lobe liver. Postsurgical changes from cholecystectomy with few foci of free gas along the liver surface and in the gallbladder fossa with a slightly more organized collection towards the terminus of the gallbladder fossa (4/27) measuring approximately 3.3 x 2.2 x 1.9 cm in size. With some surrounding phlegmonous inflammation. Slight prominence of the biliary tree is  nonspecific and post cholecystectomy state without visible calcified gallstone. Pancreas: No pancreatic ductal dilatation or surrounding inflammatory changes. Spleen: Normal in size. No concerning splenic lesions. Adrenals/Urinary Tract: Normal adrenal glands. Kidneys enhance and excrete symmetrically and uniformly. Suspect some mild bilateral renal atrophy. Areas of cortical scarring bilaterally. There is a 2 cm fluid attenuation cyst in the interpolar right kidney with a smaller 1.3 cm cyst in the lower pole (series 9, images 24, 15). No concerning renal mass. No urolithiasis or hydronephrosis. Urinary bladder is unremarkable for the degree of distention. Stomach/Bowel: Moderate hiatal hernia. Distal stomach and duodenum are unremarkable. No small bowel thickening or dilatation. No evidence of bowel obstruction. Normal appendix in the right lower quadrant. Scattered colonic diverticula without focal inflammation to suggest diverticulitis. No colonic dilatation or wall thickening. Vascular/Lymphatic: Atherosclerotic calcifications within the abdominal aorta and branch vessels. No aneurysm or ectasia. No pathologically enlarged abdominopelvic lymph nodes. Some reactive appearing adenopathy at the porta hepatis and in the mid mesentery. Reproductive: Uterus is surgically absent. No concerning adnexal lesions. Other: Postsurgical changes the anterior abdominal wall compatible with recent ventral hernia repair. Stranding of the omental fat anteriorly at the site of operation is nonspecific. A more superior bilobed ventral hernia remains similar to prior with some small volume of free fluid in the left lobulation, likely postsurgical in nature. Few foci of intraperitoneal gas extending along the liver surface as well as gas and fluid in the gallbladder fossa, likely postsurgical in nature though a more organized collection does raise some suspicion. Small amount of free fluid tracking towards the lower abdomen. Mild  body wall edema. Musculoskeletal: Multilevel degenerative changes are present in the imaged portions of the spine. Interspinous arthrosis compatible with Baastrup's disease. Review of the MIP images confirms the above findings. IMPRESSION: CT angiography of the chest: 1. Limited assessment for pulmonary artery embolism given extensive respiratory motion and cardiac pulsation artifact. No large central or lobar filling defects are identified. 2. Mild interstitial pulmonary edema and developing bilateral pleural effusions, right greater than left with adjacent areas of passive atelectasis. 3. Some persistent centrilobular ground-glass nodularity seen in the anterior lingula in a region of previously seen consolidation, could reflect some post infectious or inflammatory sequela. 4. Moderate hiatal hernia with fluid-filled distal thoracic esophagus, recommend correlation for aspiration risk. 5. Three-vessel coronary artery atherosclerosis. 6.  Aortic Atherosclerosis (ICD10-I70.0). CT abdomen and pelvis: 1. Postsurgical changes from recent laparoscopic cholecystectomy as well as conventional incisional hernia repair. 2. A small possibly organizing collection seen within the gallbladder fossa of admixed air and low to intermediate attenuation fluid with some questionable rim enhancement, could reflect a postoperative biloma, hematoma or seroma. Superinfection cannot be fully excluded. 3. Some expected postsurgical changes along the anterior abdominal wall at the  site of a paraumbilical incisional hernia repair including soft tissue gas along the anterior abdominal wall and mild stranding along the omental fat adjacent the operative site. 4. A more superior bilobed fat containing ventral hernia rim remains, grossly similar to comparison prior albeit with some stranding and free fluid in the hernia contents which can be expected postsurgically. 5. Diverticulosis without evidence of acute diverticulitis. 6.  Aortic  Atherosclerosis (ICD10-I70.0). Electronically Signed: By: Lovena Le M.D. On: 01/28/2021 21:46   CT ABDOMEN PELVIS W CONTRAST  Result Date: 02/04/2021 CLINICAL DATA:  Abdominal pain. Gallbladder inter knee a surgery 720, recent hospital discharge. Drainage from incision and fever. EXAM: CT ABDOMEN AND PELVIS WITH CONTRAST TECHNIQUE: Multidetector CT imaging of the abdomen and pelvis was performed using the standard protocol following bolus administration of intravenous contrast. CONTRAST:  55m OMNIPAQUE IOHEXOL 350 MG/ML SOLN COMPARISON:  Postoperative CT 01/28/2021 FINDINGS: Lower chest: Diminished pleural effusions. Improving basilar atelectasis, more so on the right. Stable moderate-sized hiatal hernia. Coronary artery calcifications. Hepatobiliary: Again seen low-density adjacent to the falciform ligament. No intrahepatic fluid collection. Post recent cholecystectomy. Increased size of fluid collection in the gallbladder fossa currently measuring 4 cm, previously 3.3 cm. Collection contains heterogeneous fluid and air, with small amount of fluid and air tracking superficial to the anterior liver. Minimal periportal edema without biliary dilatation. No common bile duct dilatation. Pancreas: No ductal dilatation or inflammation. Spleen: Normal in size without focal abnormality. Adrenals/Urinary Tract: Normal adrenal glands. No hydronephrosis or renal calculi. Stable cysts in the right kidney. Lobulated renal contours again seen. Urinary bladder is partially distended. Focus of air in the urinary bladder likely related to prior Foley catheter. Equivocal bladder wall thickening. Stomach/Bowel: Moderate hiatal hernia, similar to prior. Decompressed stomach. There is no small bowel obstruction or inflammation. High-density barium within the colon from prior esophagram. Normal appendix. Left colonic diverticulosis without diverticulitis. No colonic inflammation. Vascular/Lymphatic: Moderate aortic  atherosclerosis. Patent portal vein. No acute vascular findings. No enlarged lymph nodes in the abdomen or pelvis Reproductive: Status post hysterectomy. No adnexal masses. Other: Trace air in the cholecystectomy bed that tracks anterior in peripheral to the liver. Trace perihepatic fluid. Gallbladder fossa fluid collection as described above. No other focal fluid collection. No significant ascites/free fluid. There is postsurgical change of the anterior abdominal wall. There is irregularity at the skin surface with small foci of gas in the subcutaneous tissues but no drainable collection. Expected stranding of the subcutaneous tissues. Minimal stranding in the subjacent omental fat. Similar appearance of fat containing hernia just above postsurgical change, hernia neck spanning 4.4 cm transverse, series 2, image 32. Minimal air in the anterior abdominal wall which is improving from prior exam. Musculoskeletal: Stable degenerative change in the spine. No acute findings. IMPRESSION: 1. Post recent cholecystectomy. Increased size of fluid collection in the gallbladder fossa currently measuring 4 cm, previously 3.3 cm. Collection contains heterogeneous fluid and air, with small amount of fluid and air tracking superficial to the anterior liver. Differential considerations include in postoperative seroma, +/- infection, or biloma. 2. Postsurgical change of the anterior abdominal wall with irregular skin surface and small foci of gas in the subcutaneous tissues but no drainable collection. 3. Similar appearance of fat containing hernia just above postsurgical change. 4. Equivocal bladder wall thickening. 5. Colonic diverticulosis without diverticulitis. 6. Diminished pleural effusions and basilar atelectasis. 7. Moderate hiatal hernia. Aortic Atherosclerosis (ICD10-I70.0). Electronically Signed   By: MKeith RakeM.D.   On: 02/04/2021 23:56  CT Abdomen Pelvis W Contrast  Addendum Date: 01/28/2021   ADDENDUM  REPORT: 01/28/2021 21:54 ADDENDUM: These results were called by telephone at the time of interpretation on 01/28/2021 at 9:54 pm to provider Benay Pike MD, who verbally acknowledged these results. Electronically Signed   By: Lovena Le M.D.   On: 01/28/2021 21:54   Result Date: 01/28/2021 CLINICAL DATA:  Laparoscopic cholecystectomy and conventional ventral hernia repair performed 01/25/2021, now with shortness of breath and hypoxia. EXAM: CT ANGIOGRAPHY CHEST CT ABDOMEN AND PELVIS WITH CONTRAST TECHNIQUE: Multidetector CT imaging of the chest was performed using the standard protocol during bolus administration of intravenous contrast. Multiplanar CT image reconstructions and MIPs were obtained to evaluate the vascular anatomy. Multidetector CT imaging of the abdomen and pelvis was performed using the standard protocol during bolus administration of intravenous contrast. CONTRAST:  24m OMNIPAQUE IOHEXOL 350 MG/ML SOLN COMPARISON:  CT abdomen pelvis 11/24/2020, cholangiogram 01/25/2021, CT chest 08/27/2020 FINDINGS: CTA CHEST FINDINGS Cardiovascular: Fall there is satisfactory opacification of the pulmonary arteries. Exam quality is diminished by extensive cardiac pulsation and respiratory motion artifact throughout the lungs. No large central or lobar filling defects are convincingly demonstrated. More distal evaluation is largely precluded by technical factors. Top-normal cardiac size. Three-vessel coronary artery atherosclerosis. No pericardial effusion. Cardiac pulsation artifact results in suboptimal assessment of the aortic root and ascending thoracic aorta. Mediastinum/Nodes: Few scattered small subcentimeter hypoattenuating nodules in the thyroid gland are unchanged from comparison prior and unlikely to be clinically significant. Thoracic inlet is otherwise unremarkable. Moderate hiatal hernia. Fluid-filled distal thoracic esophagus. Tracheal and central airways are unremarkable. No concerning mediastinal,  hilar or axillary adenopathy. Lungs/Pleura: Small right trace left pleural effusions with some adjacent areas of passive atelectasis and additional dependent atelectatic features posteriorly. Some clustered nodules seen in the anterior lingula are new region of previously seen consolidation could reflect some postinflammatory change. No other focal airspace opacities are seen. Mild interlobular septal thickening could reflect some mild interstitial edema. Musculoskeletal: Exaggerated thoracic kyphosis. Multilevel degenerative changes are present in the imaged portions of the spine. Additional degenerative changes in the bilateral shoulders. No acute or worrisome chest wall mass or lesion. Review of the MIP images confirms the above findings. CT ABDOMEN and PELVIS FINDINGS Hepatobiliary: Normal hepatic attenuation. No concerning focal liver lesion. Question some micro lobulations along the anterior left lobe liver. Postsurgical changes from cholecystectomy with few foci of free gas along the liver surface and in the gallbladder fossa with a slightly more organized collection towards the terminus of the gallbladder fossa (4/27) measuring approximately 3.3 x 2.2 x 1.9 cm in size. With some surrounding phlegmonous inflammation. Slight prominence of the biliary tree is nonspecific and post cholecystectomy state without visible calcified gallstone. Pancreas: No pancreatic ductal dilatation or surrounding inflammatory changes. Spleen: Normal in size. No concerning splenic lesions. Adrenals/Urinary Tract: Normal adrenal glands. Kidneys enhance and excrete symmetrically and uniformly. Suspect some mild bilateral renal atrophy. Areas of cortical scarring bilaterally. There is a 2 cm fluid attenuation cyst in the interpolar right kidney with a smaller 1.3 cm cyst in the lower pole (series 9, images 24, 15). No concerning renal mass. No urolithiasis or hydronephrosis. Urinary bladder is unremarkable for the degree of  distention. Stomach/Bowel: Moderate hiatal hernia. Distal stomach and duodenum are unremarkable. No small bowel thickening or dilatation. No evidence of bowel obstruction. Normal appendix in the right lower quadrant. Scattered colonic diverticula without focal inflammation to suggest diverticulitis. No colonic dilatation or wall thickening. Vascular/Lymphatic: Atherosclerotic calcifications  within the abdominal aorta and branch vessels. No aneurysm or ectasia. No pathologically enlarged abdominopelvic lymph nodes. Some reactive appearing adenopathy at the porta hepatis and in the mid mesentery. Reproductive: Uterus is surgically absent. No concerning adnexal lesions. Other: Postsurgical changes the anterior abdominal wall compatible with recent ventral hernia repair. Stranding of the omental fat anteriorly at the site of operation is nonspecific. A more superior bilobed ventral hernia remains similar to prior with some small volume of free fluid in the left lobulation, likely postsurgical in nature. Few foci of intraperitoneal gas extending along the liver surface as well as gas and fluid in the gallbladder fossa, likely postsurgical in nature though a more organized collection does raise some suspicion. Small amount of free fluid tracking towards the lower abdomen. Mild body wall edema. Musculoskeletal: Multilevel degenerative changes are present in the imaged portions of the spine. Interspinous arthrosis compatible with Baastrup's disease. Review of the MIP images confirms the above findings. IMPRESSION: CT angiography of the chest: 1. Limited assessment for pulmonary artery embolism given extensive respiratory motion and cardiac pulsation artifact. No large central or lobar filling defects are identified. 2. Mild interstitial pulmonary edema and developing bilateral pleural effusions, right greater than left with adjacent areas of passive atelectasis. 3. Some persistent centrilobular ground-glass nodularity seen  in the anterior lingula in a region of previously seen consolidation, could reflect some post infectious or inflammatory sequela. 4. Moderate hiatal hernia with fluid-filled distal thoracic esophagus, recommend correlation for aspiration risk. 5. Three-vessel coronary artery atherosclerosis. 6.  Aortic Atherosclerosis (ICD10-I70.0). CT abdomen and pelvis: 1. Postsurgical changes from recent laparoscopic cholecystectomy as well as conventional incisional hernia repair. 2. A small possibly organizing collection seen within the gallbladder fossa of admixed air and low to intermediate attenuation fluid with some questionable rim enhancement, could reflect a postoperative biloma, hematoma or seroma. Superinfection cannot be fully excluded. 3. Some expected postsurgical changes along the anterior abdominal wall at the site of a paraumbilical incisional hernia repair including soft tissue gas along the anterior abdominal wall and mild stranding along the omental fat adjacent the operative site. 4. A more superior bilobed fat containing ventral hernia rim remains, grossly similar to comparison prior albeit with some stranding and free fluid in the hernia contents which can be expected postsurgically. 5. Diverticulosis without evidence of acute diverticulitis. 6.  Aortic Atherosclerosis (ICD10-I70.0). Electronically Signed: By: Lovena Le M.D. On: 01/28/2021 21:46   DG BONE DENSITY (DXA)  Result Date: 02/02/2021 EXAM: DUAL X-RAY ABSORPTIOMETRY (DXA) FOR BONE MINERAL DENSITY IMPRESSION: Enid Baas KAJGOTL Your patient Hodan Wurtz completed a BMD test on 01/16/2021 using the [place] (analysis version: 16.SP2) manufactured by [employer]. The following summarizes the results of our evaluation. [facility] PATIENT: Name: Marea, Reasner Patient ID: [id number] Birth Date: 04/29/1935 Height: 61.5 in. Gender: Female Measured: 01/16/2021 Weight: 159.0 lbs. Indications: Advanced Age, Caucasian, Estrogen Deficiency,  History of Osteoporosis, Hysterectomy, Parent hip Fx, Post Menopausal Fractures: Ankle, Foot Treatments: HRT ASSESSMENT: The BMD measured at Femur Neck Right is 0.814 g/cm2 with a T-score of -1.6. This patient is considered osteopenic according to [facility]) criteria. Lumbar spine was not utilized due to advanced degenerative changes. Compared with the prior study on, 06/02/2018 the BMD of the total mean shows a statistically significant decrease. The scan quality is good. Site Region Measured Date Measured Age WHO YA BMD Classification T-score DualFemur Neck Right 01/16/2023 84.2 Osteopenia -1.6 0.814 g/cm2 DualFemur Neck Right 06/02/2018 81.6 Osteopenia -1.6  0.816 g/cm2 DualFemur Total Mean 01/16/2023 84.2 Normal -0.9 0.898 g/cm2 DualFemur Total Mean 06/02/2018 81.6 Normal -0.5 0.945 g/cm2 Left Forearm Radius 33% 01/16/2023 84.2 Normal -1.0 0.786 g/cm2 Left Forearm Radius 33% 06/02/2018 81.6 Normal -0.6 0.822 g/cm2 World Health Organization [facility]) criteria for post-menopausal, Caucasian Women: Normal       T-score at or above -1 SD Osteopenia   T-score between -1 and -2.5 SD Osteoporosis T-score at or below -2.5 SD RECOMMENDATION: 1. All patients should optimize calcium and vitamin D intake. 2. Consider FDA-approved medical therapies in postmenopausal women and men aged 29 years and older, based on the following: a. A hip or vertebral(clinical or morphometric) fracture. b. T-Score < -2.5 at the femoral neck or spine after appropriate evaluation to exclude secondary causes c. Low bone mass (T-score between -1.0 and -2.5 at the femoral neck or spine) and a 10 year probability of a hip fracture >3% or a 10 year probability of major osteoporosis-related fracture > 20% based on the [place]-adapted WHO algorithm d. Clinical judgement and/or patient preferences may indicate treatment for people with 10-year fracture probabilities above or below these levels FOLLOW-UP: Patients with diagnosis of  osteoporosis or at high risk for fracture should have regular bone mineral density tests. For patients eligible for Medicare, routine testing is allowed once every 2 years. The testing frequency can be increased to one year for patients who have rapidly progressing disease, those who are receiving or discontinuing medical therapy to restore bone mass, or have additional risk factors. I have reviewed this report, and agree with the above findings. [facility] Radiology Patient: Esti, Demello Referring Physician: Rosemarie Ax Birth Date: Dec 02, 1934 [age] Patient ID: [id number] Height: 61.5 in. Weight: 159.0 lbs. Measured: 01/16/2021 3:19:59 PM (16 SP 4) Gender: Female Ethnicity: White Analyzed: 01/16/2021 3:35:25 PM (16 SP 4) FRAX* 10-year Probability of Fracture Based on femoral neck BMD: DualFemur (Right) Major Osteoporotic Fracture: 13.8% Hip Fracture:                3.8% Population:                  [place] (Caucasian) Risk Factors:                None *FRAX is a Materials engineer of the [employer] for Metabolic Bone Disease, a World Pharmacologist (WHO) [facility]. ASSESSMENT: The probability of a major osteoporotic fracture is 13.8% within the next ten years. The probability of a hip fracture is 3.8% within the next ten years. Electronically Signed   By: Rolm Baptise M.D.   On: 02/02/2021 08:35   DG Chest Portable 1 View  Result Date: 02/04/2021 CLINICAL DATA:  Shortness of breath. EXAM: PORTABLE CHEST 1 VIEW COMPARISON:  Radiograph and chest CT 01/28/2021. FINDINGS: Lower lung volumes from prior exam. Patient is rotated. Grossly stable heart size and mediastinal contours. Increasing right basilar opacity. Small pleural effusions. No pulmonary edema. No pneumothorax. IMPRESSION: 1. Lower lung volumes from prior exam. Increasing right basilar opacity, favor atelectasis over pneumonia. 2. Small pleural effusions. Electronically Signed   By: Keith Rake M.D.   On: 02/04/2021 21:44   DG Swallowing Func-Speech Pathology  Result Date: 01/30/2021 Formatting of this result is different from the original. Objective Swallowing Evaluation: Type of Study: MBS-Modified Barium Swallow Study  Patient Details Name: BRIAH NARY MRN: [id number] Date of Birth: 1934-11-08 Today's Date: 01/30/2021 Time: SLP Start  Time (ACUTE ONLY): 1325 -SLP Stop Time (ACUTE ONLY): 1350 SLP Time Calculation (min) (ACUTE ONLY): 25 min Past Medical History: Past Medical History: Diagnosis Date  Chronic stable angina ([place])   Coronary artery disease cardiologist--- dr Burt Knack  hx NSTEMI  w/ cardiac cath 03-16-2005 PCI with DES to LCx;   cardiac cath 06-27-2005  PCI with DES to RCA with residual dx LAD manage medcially;  lexiscan 01-26-20211 normal no ischemia, ef 70%;  stress echo w/ dobutamine 10-24-2011 negative ishcmeia , normal ef // Myoview 2/22: EF 67, no ischemia or infarction, no TID, low risk   Diabetes mellitus type 2, diet-controlled ([place])   followed by pcp  (10-19-2020  pt stated checks daily in am,  fasting blood surgar--- 115--120s)  DOE (dyspnea on exertion)   per pt "when I over do",  ok with household chores  Echocardiogram 08/2020   Echocardiogram 2/22: EF 55-60, no RWMA, mild LVH, Gr 2 DD, GLS-21.7%, normal RVSF, trivial MR, RVSP 39.5  Edema of right lower extremity   GERD (gastroesophageal reflux disease)   Hiatal hernia   recurrence,  hx HH repair 1989  History of cervical cancer   s/p  vaginal hysterectomy  History of DVT of lower extremity 2016  11-29-2014 post op right TKA of right lower extremity and completed xarelto   History of esophageal stricture   hx s/p dilatation's  History of gastric ulcer 2005 approx.  History of palpitations 2010  event monitor 07-07-2009 showed NSR w/ freq. SVT ectopies with short runs, rare PVCs  History of TIA (transient ischemic attack) 06/1999  12-15-2019  per pt had several TIA between 12/ 2000 to 02/ 2001 , was sent to specialist  _0 , had test that was normal (10-19-2020 pt stated no TIAs since ) but has residual of essential tremors of right arm/ hand  Hypertension   Intermittent palpitations   IT band syndrome   Migraine   "ice pick headche lasts about 30 seconds"  Mixed hyperlipidemia   Mixed incontinence urge and stress   Multiple thyroid nodules   followed by pcp---   ultrasound 11-22-2014 no bx   (12-15-2019 per pt had a endocrinologist and was told did not need bx)  OA (osteoarthritis)   knees, elbow, hip, ankles  Occasional tremors   right arm/ hand  s/p TIA residual 2000  Osteoporosis   taking vitamin d  Peroneal DVT (deep venous thrombosis) ([place]) 12/22/2014  Right bundle branch block (RBBB) with left anterior fascicular block (LAFB)   RLS (restless legs syndrome)   S/P drug eluting coronary stent placement 2006  03-16-2005  PCI x1 DES to LCx;   06-27-2005  PCI x1 DES to RCA  Urinary retention   post op sling prodecure on 10-27-2020, has foley cathether Past Surgical History: Past Surgical History: Procedure Laterality Date  ANTERIOR AND POSTERIOR REPAIR N/A 12/22/2019  Procedure: ANTERIOR (CYSTOCELE)  REPAIR;  Surgeon: Janyth Contes, MD;  Location: [facility];  Service: Gynecology;  Laterality: N/A;  ANTERIOR AND POSTERIOR REPAIR WITH SACROSPINOUS FIXATION N/A 10/27/2020  Procedure: SACROSPINOUS LIGAMENT FIXATION;  Surgeon: Jaquita Folds, MD;  Location: [facility];  Service: Gynecology;  Laterality: N/A;  BLADDER SUSPENSION N/A 10/27/2020  Procedure: TRANSVAGINAL TAPE (TVT) PROCEDURE;  Surgeon: Jaquita Folds, MD;  Location: [facility];  Service: Gynecology;  Laterality: N/A;  CATARACT EXTRACTION W/ INTRAOCULAR LENS  IMPLANT, BILATERAL  2015  CHOLECYSTECTOMY N/A 01/25/2021  Procedure: LAPAROSCOPIC CHOLECYSTECTOMY WITH INTRAOPERATIVE CHOLANGIOGRAM AND LYSIS OF ADHESIONS;  Surgeon: Michael Boston, MD;  Location: [facility];  Service: General;  Laterality: N/A;   COLONOSCOPY  last one ?  CORONARY ANGIOPLASTY WITH STENT PLACEMENT  03-16-2005   dr Lia Foyer  PCI and DES x1 to LCx  CORONARY ANGIOPLASTY WITH STENT PLACEMENT  06-27-2005  dr Lia Foyer  PCI and DES x1 to RCA with residual disease LAD 70-80% to manage medically  CYSTOSCOPY N/A 10/27/2020  Procedure: CYSTOSCOPY;  Surgeon: Jaquita Folds, MD;  Location: [facility];  Service: Gynecology;  Laterality: N/A;  CYSTOSCOPY N/A 11/09/2020  Procedure: CYSTOSCOPY;  Surgeon: Jaquita Folds, MD;  Location: [facility];  Service: Gynecology;  Laterality: N/A;  FOOT SURGERY Left 1990s  left foot stress fracture repair, per pt no hardware  HIATAL HERNIA REPAIR  1989  INCISIONAL HERNIA REPAIR N/A 01/25/2021  Procedure: PRIMARY REPAIR OF INCISIONAL HERNIA;  Surgeon: Michael Boston, MD;  Location: [facility];  Service: General;  Laterality: N/A;  KNEE ARTHROSCOPY Bilateral right ?/   left x2 , last one 09-12-2009 @ [facility]  PUBOVAGINAL SLING N/A 11/09/2020  Procedure: [place];  Surgeon: Jaquita Folds, MD;  Location[facility];  Service: Gynecology;  Laterality: N/A;  RECTOCELE REPAIR N/A 04/20/2020  Procedure: POSTERIOR REPAIR (RECTOCELE);  Surgeon: Janyth Contes, MD;  Location: [facility];  Service: Gynecology;  Laterality: N/A;  RECTOCELE REPAIR N/A 10/27/2020  Procedure: POSTERIOR REPAIR (RECTOCELE);  Surgeon: Jaquita Folds, MD;  Location: [facility];  Service: Gynecology;  Laterality: N/A;  total time requested for all procedures is 2 hours  TOTAL KNEE ARTHROPLASTY  11/12/2011  Procedure: TOTAL KNEE ARTHROPLASTY;  Surgeon: Lorn Junes, MD;  Location: [facility];  Service: Orthopedics;  Laterality: Left;  Dr Noemi Chapel wants 90 minutes for this case  TOTAL KNEE ARTHROPLASTY Right 11/29/2014  Procedure: RIGHT TOTAL KNEE ARTHROPLASTY;  Surgeon: Vickey Huger, MD;  Location: [facility];  Service: Orthopedics;  Laterality: Right;   UPPER GASTROINTESTINAL ENDOSCOPY  last one 04-25-2017  with dilatation esophageal stricture and savary dilatation  VAGINAL HYSTERECTOMY  1988   no ovaries removed for bleeeding HPI: Patient is an 86 y.o. female with PMH: DM-2, HTN, prior DVT post knee replacement, HFpEF (grade 2 DD in February 2022) who underwent a lap chole + hernia repair surgery on 7/20 and discharged 7/21. She returned to ED on 7/23 with c/o SOB with tightness, hypoxia, abdominal soreness, no BM since surgery, no nausea or vomiting. She is also c/o feeling of solid foods getting stuck when trying to swallow. CTA chest/abd/pelvis revealed mild pulmonary edema, fluid collection in gall bladder fossa, no large central PE. She was placed on a full liquids diet and SLP was ordered to assess swallow function.  Subjective: pleasant, alert sitting in radiology suite Assessment / Plan / Recommendation CHL IP CLINICAL IMPRESSIONS 01/30/2021 Clinical Impression Patient presents with a mild oropharyngeal dysphagia and with a suspected structural component with observed osteophytes and cricopharyngeal bar (no radiologist present to confirm) which slowed down bolus transit but did not fully impede and only trace to mild pharyngeal residuals observed post initiail swallows. During oral phase, patient exhibited brief oral holding of thin liquids prior to swallow, decreased mastication of regular solids and piecemeal swallowing with puree solids and regular solids. Patient exhibited trace vallecular residuals after initial sips of thin liquids and trace to minimal vallecular residuals after initial bites of puree solids. Residuals all eventually cleared with subsequent swallows. She exhibited one instance of  trace flash penetration of thin liquids via straw sips, but penetrate fully cleared. No aspiration observed and patient with good airway protection throughout. Barium tablet transit stopped briefly at approximately upper thoracic portion of esophagus when  taken with plain, thin water, but puree solids (applesauce) helped to transit tablet fully. SLP spoke with patient and daughter after MBS to discuss nature of her swallow function, provide education regarding swallow safety. SLP recommended patient sit upright for meals (preferably in chair), fully masticate solids, drink liquids throughout solids intake to keep boluses transiting through esophagus, eat/drink smaller meals. Patient and daughter both verbalized understanding. SLP Visit Diagnosis Dysphagia, oropharyngeal phase (R13.12) Attention and concentration deficit following -- Frontal lobe and executive function deficit following -- Impact on safety and function No limitations;Mild aspiration risk   CHL IP TREATMENT RECOMMENDATION 01/30/2021 Treatment Recommendations Therapy as outlined in treatment plan below   Prognosis 01/30/2021 Prognosis for Safe Diet Advancement Good Barriers to Reach Goals -- Barriers/Prognosis Comment -- CHL IP DIET RECOMMENDATION 01/30/2021 SLP Diet Recommendations Dysphagia 3 (Mech soft) solids;Thin liquid Liquid Administration via Cup;Straw Medication Administration Whole meds with puree Compensations Slow rate;Small sips/bites Postural Changes Seated upright at 90 degrees   CHL IP OTHER RECOMMENDATIONS 01/30/2021 Recommended Consults -- Oral Care Recommendations Oral care BID Other Recommendations --   CHL IP FOLLOW UP RECOMMENDATIONS 01/30/2021 Follow up Recommendations None   CHL IP FREQUENCY AND DURATION 01/30/2021 Speech Therapy Frequency (ACUTE ONLY) min 1 x/week Treatment Duration 1 week      CHL IP ORAL PHASE 01/30/2021 Oral Phase Impaired Oral - Pudding Teaspoon -- Oral - Pudding Cup -- Oral - Honey Teaspoon -- Oral - Honey Cup -- Oral - Nectar Teaspoon -- Oral - Nectar Cup -- Oral - Nectar Straw -- Oral - Thin Teaspoon -- Oral - Thin Cup Other (Comment) Oral - Thin Straw Other (Comment) Oral - Puree Delayed oral transit;Piecemeal swallowing Oral - Mech Soft -- Oral - Regular  Impaired mastication;Delayed oral transit;Piecemeal swallowing Oral - Multi-Consistency -- Oral - Pill Reduced posterior propulsion;Decreased bolus cohesion Oral Phase - Comment --  CHL IP PHARYNGEAL PHASE 01/30/2021 Pharyngeal Phase Impaired Pharyngeal- Pudding Teaspoon -- Pharyngeal -- Pharyngeal- Pudding Cup -- Pharyngeal -- Pharyngeal- Honey Teaspoon -- Pharyngeal -- Pharyngeal- Honey Cup -- Pharyngeal -- Pharyngeal- Nectar Teaspoon -- Pharyngeal -- Pharyngeal- Nectar Cup -- Pharyngeal -- Pharyngeal- Nectar Straw -- Pharyngeal -- Pharyngeal- Thin Teaspoon -- Pharyngeal -- Pharyngeal- Thin Cup Pharyngeal residue - valleculae;Pharyngeal residue - pyriform Pharyngeal -- Pharyngeal- Thin Straw Penetration/Aspiration during swallow;Pharyngeal residue - valleculae Pharyngeal Material enters airway, remains ABOVE vocal cords then ejected out Pharyngeal- Puree Pharyngeal residue - valleculae Pharyngeal -- Pharyngeal- Mechanical Soft -- Pharyngeal -- Pharyngeal- Regular Reduced pharyngeal peristalsis;Pharyngeal residue - valleculae Pharyngeal -- Pharyngeal- Multi-consistency -- Pharyngeal -- Pharyngeal- Pill Reduced pharyngeal peristalsis Pharyngeal -- Pharyngeal Comment --  CHL IP CERVICAL ESOPHAGEAL PHASE 01/30/2021 Cervical Esophageal Phase Impaired Pudding Teaspoon -- Pudding Cup -- Honey Teaspoon -- Honey Cup -- Nectar Teaspoon -- Nectar Cup -- Nectar Straw -- Thin Teaspoon -- Thin Cup Prominent cricopharyngeal segment Thin Straw Prominent cricopharyngeal segment Puree Prominent cricopharyngeal segment Mechanical Soft -- Regular Prominent cricopharyngeal segment Multi-consistency -- Pill Prominent cricopharyngeal segment Cervical Esophageal Comment spinal curvature as well as appearance of suspected cervical osteophytes (no radiologist to confirm) and cricopharyngeal bar all contributed in slowing down but not impeding transit of boluses in upper esophagus. Sonia Baller, MA, CCC-SLP Speech Therapy  ECHOCARDIOGRAM COMPLETE  Result Date: 01/29/2021    ECHOCARDIOGRAM REPORT   Patient Name:   Heather [name] Date of Exam: 01/29/2021 Medical Rec #:  [id number]      Height:       62.0 in Accession #:    [id number]     Weight:       160.9 lb Date of Birth:  [birth year]      BSA:          1.743 m Patient Age:    8 years       BP:           144/57 mmHg Patient Gender: F              HR:           71 bpm. Exam Location:  Inpatient Procedure: 2D Echo, Cardiac Doppler and Color Doppler Indications:    CHF-Acute Diastolic P79.48  History:        Patient has prior history of Echocardiogram examinations, most                 recent 08/26/2020. CAD, TIA, Arrythmias:RBBB; Risk                 Factors:Diabetes, Hypertension and Dyslipidemia.  Sonographer:    Bernadene Person RDCS Referring Phys: [phone] JARED M GARDNER  Sonographer Comments: Image acquisition challenging due to respiratory motion. IMPRESSIONS  1. Left ventricular ejection fraction, by estimation, is 60 to 65%. The left ventricle has normal function. The left ventricle has no regional wall motion abnormalities. Left ventricular diastolic parameters are indeterminate.  2. Right ventricular systolic function is normal. The right ventricular size is normal. There is normal pulmonary artery systolic pressure. The estimated right ventricular systolic pressure is 53.7 mmHg.  3. The mitral valve is normal in structure. Trivial mitral valve regurgitation. No evidence of mitral stenosis.  4. The aortic valve is tricuspid. Aortic valve regurgitation is not visualized. Mild to moderate aortic valve sclerosis/calcification is present, without any evidence of aortic stenosis.  5. The inferior vena cava is normal in size with greater than 50% respiratory variability, suggesting right atrial pressure of 3 mmHg. FINDINGS  Left Ventricle: Left ventricular ejection fraction, by estimation, is 60 to 65%. The left ventricle has normal function. The left ventricle has no regional wall  motion abnormalities. The left ventricular internal cavity size was normal in size. There is  no left ventricular hypertrophy. Left ventricular diastolic parameters are indeterminate. Normal left ventricular filling pressure. Right Ventricle: The right ventricular size is normal. No increase in right ventricular wall thickness. Right ventricular systolic function is normal. There is normal pulmonary artery systolic pressure. The tricuspid regurgitant velocity is 2.65 m/s, and  with an assumed right atrial pressure of 3 mmHg, the estimated right ventricular systolic pressure is 48.2 mmHg. Left Atrium: Left atrial size was normal in size. Right Atrium: Right atrial size was normal in size. Pericardium: There is no evidence of pericardial effusion. Mitral Valve: The mitral valve is normal in structure. Mild mitral annular calcification. Trivial mitral valve regurgitation. No evidence of mitral valve stenosis. Tricuspid Valve: The tricuspid valve is normal in structure. Tricuspid valve regurgitation is trivial. No evidence of tricuspid stenosis. Aortic Valve: The aortic valve is tricuspid. Aortic valve regurgitation is not visualized. Mild to moderate aortic valve sclerosis/calcification is present, without any evidence of aortic stenosis. Pulmonic Valve: The pulmonic valve was normal in structure. Pulmonic valve regurgitation is not visualized. No evidence of pulmonic  stenosis. Aorta: The aortic root is normal in size and structure. Venous: The inferior vena cava is normal in size with greater than 50% respiratory variability, suggesting right atrial pressure of 3 mmHg. IAS/Shunts: No atrial level shunt detected by color flow Doppler.  LEFT VENTRICLE PLAX 2D LVIDd:         3.00 cm  Diastology LVIDs:         2.10 cm  LV e' medial:    6.60 cm/s LV PW:         0.90 cm  LV E/e' medial:  14.8 LV IVS:        1.10 cm  LV e' lateral:   9.00 cm/s LVOT diam:     1.90 cm  LV E/e' lateral: 10.9 LV SV:         67 LV SV Index:   38  LVOT Area:     2.84 cm  RIGHT VENTRICLE RV S prime:     12.70 cm/s TAPSE (M-mode): 2.0 cm LEFT ATRIUM             Index       RIGHT ATRIUM           Index LA diam:        3.10 cm 1.78 cm/m  RA Area:     11.60 cm LA Vol (A2C):   52.5 ml 30.12 ml/m RA Volume:   24.70 ml  14.17 ml/m LA Vol (A4C):   47.0 ml 26.96 ml/m LA Biplane Vol: 51.4 ml 29.49 ml/m  AORTIC VALVE LVOT Vmax:   116.00 cm/s LVOT Vmean:  79.600 cm/s LVOT VTI:    0.236 m  AORTA Ao Root diam: 2.80 cm Ao Asc diam:  3.20 cm MITRAL VALVE               TRICUSPID VALVE MV Area (PHT): 3.03 cm    TR Peak grad:   28.1 mmHg MV Decel Time: 250 msec    TR Vmax:        265.00 cm/s MV E velocity: 97.90 cm/s MV A velocity: 94.10 cm/s  SHUNTS MV E/A ratio:  1.04        Systemic VTI:  0.24 m                            Systemic Diam: 1.90 cm Fransico Him MD Electronically signed by Fransico Him MD Signature Date/Time: 01/29/2021/10:38:04 AM    Final    CT IMAGE GUIDED DRAINAGE BY PERCUTANEOUS CATHETER  Result Date: 02/07/2021 INDICATION: 86 year old with a postoperative fluid collection at gallbladder fossa. EXAM: CT-GUIDED DRAIN PLACEMENT AT GALLBLADDER FOSSA MEDICATIONS: Moderate sedation ANESTHESIA/SEDATION: Fentanyl 50 mcg IV; Versed 1.0 mg IV Moderate Sedation Time:  20 minutes The patient was continuously monitored during the procedure by the interventional radiology nurse under my direct supervision. COMPLICATIONS: None immediate. PROCEDURE: Informed written consent was obtained from the patient after a thorough discussion of the procedural risks, benefits and alternatives. All questions were addressed. A timeout was performed prior to the initiation of the procedure. Patient was placed supine on the CT scanner. Images through the abdomen were obtained. The air-fluid collection in the gallbladder fossa was identified and targeted. The right upper abdomen was prepped with chlorhexidine and sterile field was created. Maximal barrier sterile technique was  utilized including caps, mask, sterile gowns, sterile gloves, sterile drape, hand hygiene and skin antiseptic. Skin was anesthetized using 1% lidocaine. Small incision was made. Using  CT guidance, an 18 gauge trocar needle was directed into the gallbladder fossa fluid collection. Pink purulent fluid was aspirated. A superstiff Amplatz wire was advanced into the collection and the tract was dilated to accommodate a 10 [place] multipurpose drain. Drain was advanced into the collection and follow up CT images were obtained. Approximately 15 mL of purulent fluid was aspirated. Fluid was sent for culture. Catheter was sutured to skin and attached to a suction bulb. FINDINGS: Air-fluid collection in the gallbladder fossa. Collection was decompressed following drain placement. 15 mL of pink opaque fluid was removed. IMPRESSION: CT-guided placement of a drainage catheter within the gallbladder fossa fluid collection. Electronically Signed   By: Markus Daft M.D.   On: 02/07/2021 15:53   VAS [place] LOWER EXTREMITY VENOUS (DVT)  Result Date: 01/30/2021  Lower Venous DVT Study Patient Name:  EZMAE SPEERS  Date of Exam:   01/30/2021 Medical Rec #: [id number]       Accession #:    [id number] Date of Birth: [birth year]       Patient Gender: F Patient Age:   86Y Exam Location:  [facility] Procedure:      VAS [place] LOWER EXTREMITY VENOUS (DVT) Referring Phys: [id number] MICHAEL M MACZIS --------------------------------------------------------------------------------  Indications: Edema.  Risk Factors: None identified. Comparison Study: No prior studies. Performing Technologist: Oliver Hum RVT  Examination Guidelines: A complete evaluation includes B-mode imaging, spectral Doppler, color Doppler, and power Doppler as needed of all accessible portions of each vessel. Bilateral testing is considered an integral part of a complete examination. Limited examinations for reoccurring indications may be performed as noted. The reflux  portion of the exam is performed with the patient in reverse Trendelenburg.  +---------+---------------+---------+-----------+----------+--------------+ RIGHT    CompressibilityPhasicitySpontaneityPropertiesThrombus Aging +---------+---------------+---------+-----------+----------+--------------+ CFV      Full           Yes      Yes                                 +---------+---------------+---------+-----------+----------+--------------+ SFJ      Full                                                        +---------+---------------+---------+-----------+----------+--------------+ FV Prox  Full                                                        +---------+---------------+---------+-----------+----------+--------------+ FV Mid   Full                                                        +---------+---------------+---------+-----------+----------+--------------+ FV DistalFull                                                        +---------+---------------+---------+-----------+----------+--------------+  PFV      Full                                                        +---------+---------------+---------+-----------+----------+--------------+ POP      Full           Yes      Yes                                 +---------+---------------+---------+-----------+----------+--------------+ PTV      Full                                                        +---------+---------------+---------+-----------+----------+--------------+ PERO     Full                                                        +---------+---------------+---------+-----------+----------+--------------+   +---------+---------------+---------+-----------+----------+--------------+ LEFT     CompressibilityPhasicitySpontaneityPropertiesThrombus Aging +---------+---------------+---------+-----------+----------+--------------+ CFV      Full           Yes      Yes                                  +---------+---------------+---------+-----------+----------+--------------+ SFJ      Full                                                        +---------+---------------+---------+-----------+----------+--------------+ FV Prox  Full                                                        +---------+---------------+---------+-----------+----------+--------------+ FV Mid   Full                                                        +---------+---------------+---------+-----------+----------+--------------+ FV DistalFull                                                        +---------+---------------+---------+-----------+----------+--------------+ PFV      Full                                                        +---------+---------------+---------+-----------+----------+--------------+   POP      Full           Yes      Yes                                 +---------+---------------+---------+-----------+----------+--------------+ PTV      Full                                                        +---------+---------------+---------+-----------+----------+--------------+ PERO     Full                                                        +---------+---------------+---------+-----------+----------+--------------+     Summary: RIGHT: - There is no evidence of deep vein thrombosis in the lower extremity.  - No cystic structure found in the popliteal fossa.  LEFT: - There is no evidence of deep vein thrombosis in the lower extremity.  - No cystic structure found in the popliteal fossa.  *See table(s) above for measurements and observations. Electronically signed by Ruta Hinds MD on 01/30/2021 at 4:33:10 PM.    Final    NM HEPATOBILIARY LEAK (POST-SURGICAL)  Result Date: 02/06/2021 CLINICAL DATA:  Upper abdominal pain. Recent cholecystectomy (01/25/2021). EXAM: NUCLEAR MEDICINE HEPATOBILIARY IMAGING TECHNIQUE: Sequential images  of the abdomen were obtained out to 60 minutes following intravenous administration of radiopharmaceutical. RADIOPHARMACEUTICALS:  5.5 mCi Tc-54m Choletec IV COMPARISON:  Abdominal CT 02/04/2021 FINDINGS: There is prompt uptake and biliary excretion of activity by the liver. There is normal passage of activity into the small bowel. There is no focal retention of activity within the cholecystectomy bed or spillage of activity into the peritoneal cavity. IMPRESSION: Normal hepatobiliary scan status post cholecystectomy. No evidence of bile leak. Electronically Signed   By: WRichardean SaleM.D.   On: 02/06/2021 15:10   DG ESOPHAGUS W SINGLE CM (SOL OR THIN BA)  Result Date: 01/31/2021 CLINICAL DATA:  Dysphagia R13.10 (ICD-10-CM). Additional history provided: Patient reports difficulty EXAM: ESOPHOGRAM/BARIUM SWALLOW TECHNIQUE: Single contrast examination was performed using thin barium. FLUOROSCOPY TIME:  Fluoroscopy Time:  1 minutes, 54 seconds. Radiation Exposure Index (if provided by the fluoroscopic device): 30.1 mGy. Number of Acquired Spot Images: 1 COMPARISON:  CT of the chest/abdomen/pelvis 01/28/2021. Report from esophagram 03/21/2000 (images unavailable). FINDINGS: Somewhat limited examination due to the patient's limited ability to reposition. There is a small contour irregularity along the right lateral aspect of the mid-to-distal esophagus. Fluoroscopic evaluation otherwise demonstrates normal caliber and smooth contour of the esophagus. Prominent intermittent esophageal dysmotility with tertiary contractions. Redemonstrated moderate-sized hiatal hernia. Intermittent, and mildly delayed, passage of contrast from the hiatal hernia into the more distal stomach. The patient swallowed a 13 mm barium tablet, which freely passed into the hiatal hernia. However, the tablet did not pass into the more distal stomach despite a prolonged period of observation. No gastroesophageal reflux was observed. IMPRESSION:  Somewhat limited examination due to the patient's limited ability to reposition. Small contour irregularity along the right lateral aspect of the mid-to-distal esophagus. Endoscopy is recommended to exclude a mass/lesion at this site.  Redemonstrated moderate-sized hiatal hernia. Intermittent and mildly delayed passage of contrast from the hiatal hernia into the more distal stomach. A swallowed 13 mm barium tablet freely passed into the hiatal hernia. However, the tablet did not pass into the more distal stomach, despite a prolonged period of observation. Prominent intermittent esophageal dysmotility with tertiary contractions. Electronically Signed   By: Kellie Simmering DO   On: 01/31/2021 15:37    Microbiology: Recent Results (from the past 240 hour(s))  Urine Culture     Status: Abnormal   Collection Time: 01/31/21 12:49 PM   Specimen: Urine, Clean Catch  Result Value Ref Range Status   Specimen Description   Final    URINE, CLEAN CATCH Performed at [facility], [place] [address] [place], [place] [address]    Special Requests   Final    NONE Performed at [facility], [place] [address] [place], [place] [address]    Culture >=100,000 COLONIES/mL KLEBSIELLA OXYTOCA (A)  Final   Report Status 02/02/2021 FINAL  Final   Organism ID, Bacteria KLEBSIELLA OXYTOCA (A)  Final      Susceptibility   Klebsiella oxytoca - MIC*    AMPICILLIN >=32 RESISTANT Resistant     CEFAZOLIN 8 SENSITIVE Sensitive     CEFEPIME <=0.12 SENSITIVE Sensitive     CEFTRIAXONE <=0.25 SENSITIVE Sensitive     CIPROFLOXACIN <=0.25 SENSITIVE Sensitive     GENTAMICIN <=1 SENSITIVE Sensitive     IMIPENEM <=0.25 SENSITIVE Sensitive     NITROFURANTOIN <=16 SENSITIVE Sensitive     TRIMETH/SULFA <=20 SENSITIVE Sensitive     AMPICILLIN/SULBACTAM 16 INTERMEDIATE Intermediate     PIP/TAZO <=4 SENSITIVE Sensitive     * >=100,000 COLONIES/mL KLEBSIELLA OXYTOCA  Culture, blood (routine x 2)      Status: Abnormal   Collection Time: 02/04/21  9:30 PM   Specimen: BLOOD  Result Value Ref Range Status   Specimen Description   Final    BLOOD SITE NOT SPECIFIED Performed at [place] [address], [place], [place] [address]    Special Requests   Final    BOTTLES DRAWN AEROBIC AND ANAEROBIC Blood Culture adequate volume Performed at [facility] [address], [place], [place] [address]    Culture  Setup Time   Final    GRAM POSITIVE RODS ANAEROBIC BOTTLE ONLY CRITICAL RESULT CALLED TO, READ BACK BY AND VERIFIED WITH: m. lilliston PHARMD, AT 5790 02/07/21 D. VANHOOK    Culture (A)  Final    LACTOBACILLUS CASEI Standardized susceptibility testing for this organism is not available. Performed at [facility], [place] [address] [place], [place] [address]    Report Status 02/10/2021 FINAL  Final  Resp Panel by RT-PCR (Flu A&B, Covid) Nasopharyngeal Swab     Status: None   Collection Time: 02/04/21  9:30 PM   Specimen: Nasopharyngeal Swab; Nasopharyngeal(NP) swabs in vial transport medium  Result Value Ref Range Status   SARS Coronavirus 2 by RT PCR NEGATIVE NEGATIVE Final    Comment: (NOTE) SARS-CoV-2 target nucleic acids are NOT DETECTED.  The SARS-CoV-2 RNA is generally detectable in upper respiratory specimens during the acute phase of infection. The lowest concentration of SARS-CoV-2 viral copies this assay can detect is 138 copies/mL. A negative result does not preclude SARS-Cov-2 infection and should not be used as the sole basis for treatment or other patient management decisions. A negative result may occur with  improper specimen collection/handling, submission of specimen  other than nasopharyngeal swab, presence of viral mutation(s) within the areas targeted by this assay, and inadequate number of viral copies(<138 copies/mL). A negative result must be combined with clinical observations, patient history, and  epidemiological information. The expected result is Negative.  Fact Sheet for Patients:  EntrepreneurPulse.com.au  Fact Sheet for Healthcare Providers:  IncredibleEmployment.be  This test is no t yet approved or cleared by the [place] FDA and  has been authorized for detection and/or diagnosis of SARS-CoV-2 by FDA under an Emergency Use Authorization (EUA). This EUA will remain  in effect (meaning this test can be used) for the duration of the COVID-19 declaration under Section 564(b)(1) of the Act, 21 U.S.C.section 360bbb-3(b)(1), unless the authorization is terminated  or revoked sooner.       Influenza A by PCR NEGATIVE NEGATIVE Final   Influenza B by PCR NEGATIVE NEGATIVE Final    Comment: (NOTE) The Xpert Xpress SARS-CoV-2/FLU/RSV plus assay is intended as an aid in the diagnosis of influenza from Nasopharyngeal swab specimens and should not be used as a sole basis for treatment. Nasal washings and aspirates are unacceptable for Xpert Xpress SARS-CoV-2/FLU/RSV testing.  Fact Sheet for Patients: EntrepreneurPulse.com.au  Fact Sheet for Healthcare Providers: IncredibleEmployment.be  This test is not yet approved or cleared by the [place] FDA and has been authorized for detection and/or diagnosis of SARS-CoV-2 by FDA under an Emergency Use Authorization (EUA). This EUA will remain in effect (meaning this test can be used) for the duration of the COVID-19 declaration under Section 564(b)(1) of the Act, 21 U.S.C. section 360bbb-3(b)(1), unless the authorization is terminated or revoked.  Performed at [facility], [place] [address] [place], [place] [address]   Urine Culture     Status: Abnormal   Collection Time: 02/05/21  2:06 AM   Specimen: Urine, Clean Catch  Result Value Ref Range Status   Specimen Description   Final    URINE, CLEAN CATCH Performed at [facility], [place] [address] [place], [place] [address]    Special Requests   Final    NONE Performed at [facility], [place] [address] [place], [place] [address]    Culture (A)  Final    <10,000 COLONIES/mL INSIGNIFICANT GROWTH Performed at [place] [address] [place], [place] [address]    Report Status 02/06/2021 FINAL  Final  Culture, blood (routine x 2)     Status: None   Collection Time: 02/05/21  5:17 AM   Specimen: BLOOD  Result Value Ref Range Status   Specimen Description   Final    BLOOD RIGHT ANTECUBITAL Performed at [facility] [place] [address], [place], [place] [address]    Special Requests   Final    BOTTLES DRAWN AEROBIC ONLY Blood Culture adequate volume Performed at [place] [address] [place], [place] [address]    Culture   Final    NO GROWTH 5 DAYS Performed at [facility], [place] [address], [place], [place] [address]    Report Status 02/10/2021 FINAL  Final  Aerobic/Anaerobic Culture w Gram Stain (surgical/deep wound)     Status: None (Preliminary result)   Collection Time: 02/07/21  1:07 PM   Specimen: Abscess  Result Value Ref Range Status   Specimen Description   Final    ABSCESS RIGHT UPPER QUADRANT Performed at [place] [address], [place], [place] [address]    Special  Requests   Final    NONE Performed at [facility], [place] [address] [place], [place] [address]    Gram Stain   Final    ABUNDANT WBC PRESENT, PREDOMINANTLY PMN FEW GRAM POSITIVE RODS Performed at [facility], [place] [address], [place], [place] [address]    Culture   Final    MODERATE GRAM NEGATIVE RODS IDENTIFICATION AND SUSCEPTIBILITIES TO FOLLOW CULTURE REINCUBATED FOR BETTER GROWTH NO ANAEROBES ISOLATED; CULTURE IN PROGRESS FOR 5 DAYS    Report Status PENDING  Incomplete  Culture, blood (routine x 2)     Status: None  (Preliminary result)   Collection Time: 02/07/21  3:07 PM   Specimen: Right Antecubital; Blood  Result Value Ref Range Status   Specimen Description   Final    RIGHT ANTECUBITAL Performed at [facility], [place] [address], [place], [place] [address]    Special Requests   Final    BOTTLES DRAWN AEROBIC ONLY Blood Culture adequate volume Performed at [place] [address], [place], [place] [address]    Culture   Final    NO GROWTH 3 DAYS Performed at [facility], [place] [address], [place], [place] [address]    Report Status PENDING  Incomplete  Culture, blood (routine x 2)     Status: None (Preliminary result)   Collection Time: 02/07/21  3:15 PM   Specimen: BLOOD LEFT WRIST  Result Value Ref Range Status   Specimen Description   Final    BLOOD LEFT WRIST Performed at [place] [address] [place], [place] [address]    Special Requests   Final    BOTTLES DRAWN AEROBIC ONLY Blood Culture adequate volume Performed at [place] [address] [place], [place] [address]    Culture   Final    NO GROWTH 3 DAYS Performed at [facility], [place] [address] [place], [place] [address]    Report Status PENDING  Incomplete     Labs: Basic Metabolic Panel: Recent Labs  Lab 02/06/21 0355 02/07/21 0401 02/08/21 0419 02/09/21 0432 02/10/21 0418  NA 132* 130* 135 138 140  K 4.8 4.8 4.9 4.4 4.1  CL 100 103 107 105 108  CO2 21* 18* 21* 23 22  GLUCOSE 93 127* 91 88 85  BUN 25* _0 CREATININE 1.46* 1.08* 0.74 0.97 0.88  CALCIUM 8.0* 8.1* 8.5* 8.6* 8.6*   Liver Function Tests: Recent Labs  Lab 02/04/21 2130 02/06/21 0355 02/07/21 0401 02/08/21 0419  AST _1 ALT _2 ALKPHOS 164* 112 138* 114  BILITOT 0.6 0.7 0.9 0.7  PROT 6.6 5.4* 5.6* 5.5*  ALBUMIN 3.1* 2.6* 2.7* 2.6*   No results for input(s): LIPASE, AMYLASE in the last 168 hours. No results  for input(s): AMMONIA in the last 168 hours. CBC: Recent Labs  Lab 02/04/21 2130 02/06/21 0355 02/07/21 0401 02/08/21 0419 02/09/21 0432 02/10/21 0418  WBC 12.4* 18.8* 13.1* 7.9 5.8 5.7  NEUTROABS 11.9*  --   --   --   --   --   HGB 11.9* 9.5* 10.1* 9.2* 10.0* 10.4*  HCT 36.3 29.4* 31.6* 28.4* 31.0* 32.7*  MCV 92.6 93.3 94.6 93.1 93.1 92.1  PLT 291 222 198 191 214 254   Cardiac Enzymes: No results for input(s): CKTOTAL, CKMB, CKMBINDEX, TROPONINI in the last 168 hours. BNP: BNP (last 3  results) Recent Labs    01/29/21 0015 01/31/21 0501 02/08/21 0417  BNP 453.4* 118.6* 428.0*    ProBNP (last 3 results) Recent Labs    08/19/20 1351  PROBNP 428    CBG: Recent Labs  Lab 02/05/21 0419  GLUCAP 133*       Signed:  Annita Brod, MD [facility] 02/10/2021, 10:51 AM

## 2021-02-10 NOTE — Progress Notes (Addendum)
Heather [name] [id number] [birth year]  CARE TEAM:  PCP: Mosie Lukes, MD  Outpatient Care Team: Patient Care Team: Mosie Lukes, MD as PCP - General (Family Medicine) [name], MD as PCP - Cardiology (Cardiology) Idelle Leech, [place] (Optometry) Janyth Contes, MD as Consulting Physician (Obstetrics and Gynecology) Trula Slade, DPM as Consulting Physician (Podiatry) Sharmon Revere as Physician Assistant (Cardiology) [name], PharmD (Pharmacist) Michael Boston, MD as Consulting Physician (General Surgery) Jaquita Folds, MD as Consulting Physician (Gynecology) Armbruster, Carlota Raspberry, MD as Consulting Physician (Gastroenterology)  Inpatient Treatment Team: Treatment Team: Attending Provider: Annita Brod, MD; Consulting Physician: Michael Boston, MD; Rounding Team: Redmond Baseman, MD; Mobility Specialist: Viviann Spare Charge Nurse: Sunny Schlein, RN   Problem List:   Principal Problem:   Sepsis [facility]) Active Problems:   Essential hypertension   Coronary atherosclerosis   DM (diabetes mellitus) ([place])   Staphylococcus aureus carrier   RLS (restless legs syndrome)   Chronic calculous cholecystitis s/p lap cholecystectomy 01/25/2021   Incarcerated incisional hernia s/p primary repair 01/25/2021   Intra-abdominal fluid collection   UTI (urinary tract infection)   AKI (acute kidney injury) ([place])   Chronic diastolic CHF (congestive heart failure) ([place])      01/25/2021  POST-OPERATIVE DIAGNOSIS:    Chronic Calculus cholecystitis Incarcerated incisional hernias   PROCEDURE:  Laparoscopic cholecystectomy with intraoperative cholangiogram (CPT code [id number]) Reduction and primary repair of incarcerated incisional hernias   SURGEON:  [name], MD, FACS.  OR FINDINGS:  Dilated boggy gallbladder with thickening consistent with chronic cholecystitis.  Very narrow cystic duct with stricturing suspicious for chronic partial  cystic duct outlet obstruction.  Cholangiogram with dilated intra and extrahepatic ducts most likely consistent with advanced age.  No evidence of choledocholithiasis nor leak.   Liver: normal no needle biopsy done   Supraumbilical 7 x 3 cm region of incisional hernia incarcerated with omentum.  Largest periumbilical.  Reduction debridement and primary repair done.  No mesh repair given the fact cholecystectomy was done at the same time. SURGICAL PATHOLOGY  CASE: [id number]  PATIENT: [facility]  Surgical Pathology Report      Clinical History: Symptomatic biliary colic, probable chronic  cholecystitis, incisional hernia (j[facility])    FINAL MICROSCOPIC DIAGNOSIS:   A. GALLBLADDER, CHOLECYSTECTOMY:  -  Chronic cholecystitis with cholelithiasis   Quention Mcneill DESCRIPTION:   Specimen: Gallbladder, received fresh  Size/?Intact: 12.2 x 4 x 2.6 cm, intact  Serosal surface: Pink green with few scattered fatty adhesions  Mucosa/Wall: The mucosa varies from light green to red-green, and  smooth, soft.  The wall averages 0.2 cm thick.  Contents: There are multiple black roughened choleliths ranging from  minute fragments to 1.2 cm.  Cystic duct: Patent  Block Summary:  Representative sections in 1 block.   SW 01/26/2021     Final Diagnosis performed by Thressa Sheller, MD.   Electronically signed  01/27/2021  Technical component performed at [facility], [place]  [address] [place], [place] [address].   Professional component performed at [employer],  [place] [address], [place], [place] [address].   Immunohistochemistry Technical component (if applicable) was performed  at [facility]. [address] [place],  [place], [place] [address].   IMMUNOHISTOCHEMISTRY DISCLAIMER (if applicable):  Some of these immunohistochemical stains may have been developed and the  performance characteristics determine by [facility]. Some  may  not have been cleared  or approved by the U.S. Food and Drug  Administration. The FDA has determined that such clearance or approval  is not necessary. This test is used for clinical purposes. It should not  be regarded as investigational or for research. This laboratory is  certified under the [place]  (CLIA-88) as qualified to perform high complexity clinical laboratory  testing.  The controls stained appropriately   Assessment  Improving urinary tract infection postop gallbladder fossa fluid collection  Skin separation at incision w/o cellulitis no definite bile leak  [facility] Stay = 5 days)  Plan:  Stable for discharge from surgery standpoint.    Perc drain output has been minimal and serous for the past few days.  I removed the drain.    ABx x5 days after drain placement = d/c 8/7 - Zosyn only w Kleb UTI & no MRSA.  Augmentin PO at d/c if sooner than 5d  Pack wound once a day.  Using ribbon Nu Gauze to avoid over-packing.    Pain control - improved - tylenol QID w flexeril & oxyIR PRN breakthrough (had hallucinations w tramadol in past = documented as an "allergy"  Physical and Occupational Therapy as tolerated her elderly deconditioned state.  She is up in chair & did walk, so maybe just [facility] safety PT visit  Some diabetes and hyperglycemia last admission but seems better now.  She came in somewhat dehydrated with elevated creatinine but that is normalizing.  Tolerating diuresis  Tol. Heart solid diet.  Supplemental shakes with her being malnourished.  Continue H2 blockers for chronic GERD.  Maalox for breakthrough heartburn  Hypertension control  VTE prophylaxis- SCDs, etc   Disposition:  Disposition:  The patient is from: Home  Anticipate discharge to:  Home with Home Health  Anticipated Date of Discharge is:  August 4,2022  Barriers to discharge:  Pending Clinical improvement (more likely than not)  Patient  currently is NOT MEDICALLY STABLE for discharge from the hospital from a surgery standpoint.      25 minutes spent in review, evaluation, examination, counseling, and coordination of care.   I have reviewed this patient's available data, including medical history, events of note, physical examination and test results as part of my evaluation.  A significant portion of that time was spent in counseling.  Care during the described time interval was provided by me.  02/10/2021    Subjective: (Chief complaint)  Sleeping fine.  Trying to eat but nothing tastes good.  Walking better  Pain mainly at the drain site.  Output minimal.  Tolerating dressing changes.  Wants to go home.  Objective:  Vital signs:  Vitals:   02/09/21 0532 02/09/21 1339 02/09/21 2050 02/10/21 0531  BP: 135/73 (!) 138/59 132/62 (!) 147/65  Pulse: 64 (!) 58 64 63  Resp: '17 16 16 16  '$ Temp: 97.7 F (36.5 C) 97.8 F (36.6 C) 97.8 F (36.6 C) 98.4 F (36.9 C)  TempSrc:  Oral Oral Oral  SpO2: 97% 98% 95% 96%  Weight:      Height:        Last BM Date: 02/08/21  Intake/Output   Yesterday:  08/04 0701 - 08/05 0700 In: 1131.8 [P.O.:980; I.V.:5; IV Piggyback:146.8] Out: [id number] [Urine:1101; Drains:10; Stool:1] This shift:  No intake/output data recorded.  Bowel function:  Flatus: YES  BM:  No  Drain: Serous   Physical Exam:  General: Pt awakens to be alert in no acute distress.  Calm/relaxed. Eyes: PERRL, normal  EOM.  Sclera clear.  No icterus Neuro: CN II-XII intact w/o focal sensory/motor deficits. Lymph: No head/neck/groin lymphadenopathy Psych:  No delerium/psychosis/paranoia.  Oriented x 4 HENT: Normocephalic, Mucus membranes moist.  No thrush Neck: Supple, No tracheal deviation.  No obvious thyromegaly Chest: No pain to chest wall compression.  Good respiratory excursion.  No audible wheezing CV:  Pulses intact.  Regular rhythm.  No major extremity edema MS: Normal AROM mjr joints.  No  obvious deformity  Abdomen: Soft.  Nondistended.  Tenderness at right upper quadrant minimal & mainly at drain site -titrate stretch and drain remove drain without difficulty...  Midline incision closed with 15 mm separation that probes 3cm cepalad into the subcutaneous tissues.  Clean with better granulation but no dehiscence or to peritonitis..  Packing.  Dressing clean dry and intact.  No evidence of peritonitis.  No incarcerated hernias.  Ext:   No deformity.  No mjr edema.  No cyanosis Skin: No petechiae / purpurea.  No major sores.  Warm and dry    Results:   Cultures: Recent Results (from the past 720 hour(s))  SARS CORONAVIRUS 2 (TAT 6-24 HRS) Nasopharyngeal Nasopharyngeal Swab     Status: None   Collection Time: 01/24/21  2:02 PM   Specimen: Nasopharyngeal Swab  Result Value Ref Range Status   SARS Coronavirus 2 NEGATIVE NEGATIVE Final    Comment: (NOTE) SARS-CoV-2 target nucleic acids are NOT DETECTED.  The SARS-CoV-2 RNA is generally detectable in upper and lower respiratory specimens during the acute phase of infection. Negative results do not preclude SARS-CoV-2 infection, do not rule out co-infections with other pathogens, and should not be used as the sole basis for treatment or other patient management decisions. Negative results must be combined with clinical observations, patient history, and epidemiological information. The expected result is Negative.  Fact Sheet for Patients: SugarRoll.be  Fact Sheet for Healthcare Providers: https://www.woods-mathews.com/  This test is not yet approved or cleared by the [place] FDA and  has been authorized for detection and/or diagnosis of SARS-CoV-2 by FDA under an Emergency Use Authorization (EUA). This EUA will remain  in effect (meaning this test can be used) for the duration of the COVID-19 declaration under Se ction 564(b)(1) of the Act, 21 U.S.C. section  360bbb-3(b)(1), unless the authorization is terminated or revoked sooner.  Performed at [facility], [place] [address], [place], [place] [address]   Resp Panel by RT-PCR (Flu A&B, Covid) Nasopharyngeal Swab     Status: None   Collection Time: 01/28/21  7:23 PM   Specimen: Nasopharyngeal Swab; Nasopharyngeal(NP) swabs in vial transport medium  Result Value Ref Range Status   SARS Coronavirus 2 by RT PCR NEGATIVE NEGATIVE Final    Comment: (NOTE) SARS-CoV-2 target nucleic acids are NOT DETECTED.  The SARS-CoV-2 RNA is generally detectable in upper respiratory specimens during the acute phase of infection. The lowest concentration of SARS-CoV-2 viral copies this assay can detect is 138 copies/mL. A negative result does not preclude SARS-Cov-2 infection and should not be used as the sole basis for treatment or other patient management decisions. A negative result may occur with  improper specimen collection/handling, submission of specimen other than nasopharyngeal swab, presence of viral mutation(s) within the areas targeted by this assay, and inadequate number of viral copies(<138 copies/mL). A negative result must be combined with clinical observations, patient history, and epidemiological information. The expected result is Negative.  Fact Sheet for Patients:  EntrepreneurPulse.com.au  Fact Sheet for Healthcare Providers:  IncredibleEmployment.be  This test is no t yet approved or cleared by the [place] and  has been authorized for detection and/or diagnosis of SARS-CoV-2 by FDA under an Emergency Use Authorization (EUA). This EUA will remain  in effect (meaning this test can be used) for the duration of the COVID-19 declaration under Section 564(b)(1) of the Act, 21 U.S.C.section 360bbb-3(b)(1), unless the authorization is terminated  or revoked sooner.       Influenza A by PCR NEGATIVE NEGATIVE Final   Influenza B by PCR  NEGATIVE NEGATIVE Final    Comment: (NOTE) The Xpert Xpress SARS-CoV-2/FLU/RSV plus assay is intended as an aid in the diagnosis of influenza from Nasopharyngeal swab specimens and should not be used as a sole basis for treatment. Nasal washings and aspirates are unacceptable for Xpert Xpress SARS-CoV-2/FLU/RSV testing.  Fact Sheet for Patients: EntrepreneurPulse.com.au  Fact Sheet for Healthcare Providers: IncredibleEmployment.be  This test is not yet approved or cleared by the [place] FDA and has been authorized for detection and/or diagnosis of SARS-CoV-2 by FDA under an Emergency Use Authorization (EUA). This EUA will remain in effect (meaning this test can be used) for the duration of the COVID-19 declaration under Section 564(b)(1) of the Act, 21 U.S.C. section 360bbb-3(b)(1), unless the authorization is terminated or revoked.  Performed at [facility], [place] [address], [place], [place] [address]   Urine Culture     Status: Abnormal   Collection Time: 01/31/21 12:49 PM   Specimen: Urine, Clean Catch  Result Value Ref Range Status   Specimen Description   Final    URINE, CLEAN CATCH Performed at [facility], [place] [address], [place], [place] [address]    Special Requests   Final    NONE Performed at [facility], [place] [address], [place], [place] [address]    Culture >=100,000 COLONIES/mL KLEBSIELLA OXYTOCA (A)  Final   Report Status 02/02/2021 FINAL  Final   Organism ID, Bacteria KLEBSIELLA OXYTOCA (A)  Final      Susceptibility   Klebsiella oxytoca - MIC*    AMPICILLIN >=32 RESISTANT Resistant     CEFAZOLIN 8 SENSITIVE Sensitive     CEFEPIME <=0.12 SENSITIVE Sensitive     CEFTRIAXONE <=0.25 SENSITIVE Sensitive     CIPROFLOXACIN <=0.25 SENSITIVE Sensitive     GENTAMICIN <=1 SENSITIVE Sensitive     IMIPENEM <=0.25 SENSITIVE Sensitive     NITROFURANTOIN <=16 SENSITIVE  Sensitive     TRIMETH/SULFA <=20 SENSITIVE Sensitive     AMPICILLIN/SULBACTAM 16 INTERMEDIATE Intermediate     PIP/TAZO <=4 SENSITIVE Sensitive     * >=100,000 COLONIES/mL KLEBSIELLA OXYTOCA  Culture, blood (routine x 2)     Status: None (Preliminary result)   Collection Time: 02/04/21  9:30 PM   Specimen: BLOOD  Result Value Ref Range Status   Specimen Description   Final    BLOOD SITE NOT SPECIFIED Performed at [place] [address] [place], [place] [address]    Special Requests   Final    BOTTLES DRAWN AEROBIC AND ANAEROBIC Blood Culture adequate volume Performed at [place] [address], [place], [place] [address]    Culture  Setup Time   Final    GRAM POSITIVE RODS ANAEROBIC BOTTLE ONLY CRITICAL RESULT CALLED TO, READ BACK BY AND VERIFIED WITH: m. lilliston PHARMD, AT [id number] 02/07/21 D. VANHOOK    Culture   Final    GRAM POSITIVE RODS IDENTIFICATION TO  FOLLOW Performed at [facility], [place] [address], [place], [place] [address]    Report Status PENDING  Incomplete  Resp Panel by RT-PCR (Flu A&B, Covid) Nasopharyngeal Swab     Status: None   Collection Time: 02/04/21  9:30 PM   Specimen: Nasopharyngeal Swab; Nasopharyngeal(NP) swabs in vial transport medium  Result Value Ref Range Status   SARS Coronavirus 2 by RT PCR NEGATIVE NEGATIVE Final    Comment: (NOTE) SARS-CoV-2 target nucleic acids are NOT DETECTED.  The SARS-CoV-2 RNA is generally detectable in upper respiratory specimens during the acute phase of infection. The lowest concentration of SARS-CoV-2 viral copies this assay can detect is 138 copies/mL. A negative result does not preclude SARS-Cov-2 infection and should not be used as the sole basis for treatment or other patient management decisions. A negative result may occur with  improper specimen collection/handling, submission of specimen other than nasopharyngeal swab, presence of viral mutation(s)  within the areas targeted by this assay, and inadequate number of viral copies(<138 copies/mL). A negative result must be combined with clinical observations, patient history, and epidemiological information. The expected result is Negative.  Fact Sheet for Patients:  EntrepreneurPulse.com.au  Fact Sheet for Healthcare Providers:  IncredibleEmployment.be  This test is no t yet approved or cleared by the [place] FDA and  has been authorized for detection and/or diagnosis of SARS-CoV-2 by FDA under an Emergency Use Authorization (EUA). This EUA will remain  in effect (meaning this test can be used) for the duration of the COVID-19 declaration under Section 564(b)(1) of the Act, 21 U.S.C.section 360bbb-3(b)(1), unless the authorization is terminated  or revoked sooner.       Influenza A by PCR NEGATIVE NEGATIVE Final   Influenza B by PCR NEGATIVE NEGATIVE Final    Comment: (NOTE) The Xpert Xpress SARS-CoV-2/FLU/RSV plus assay is intended as an aid in the diagnosis of influenza from Nasopharyngeal swab specimens and should not be used as a sole basis for treatment. Nasal washings and aspirates are unacceptable for Xpert Xpress SARS-CoV-2/FLU/RSV testing.  Fact Sheet for Patients: EntrepreneurPulse.com.au  Fact Sheet for Healthcare Providers: IncredibleEmployment.be  This test is not yet approved or cleared by the [place] FDA and has been authorized for detection and/or diagnosis of SARS-CoV-2 by FDA under an Emergency Use Authorization (EUA). This EUA will remain in effect (meaning this test can be used) for the duration of the COVID-19 declaration under Section 564(b)(1) of the Act, 21 U.S.C. section 360bbb-3(b)(1), unless the authorization is terminated or revoked.  Performed at [facility], [place] [address], [place], [place] [address]   Urine Culture     Status: Abnormal    Collection Time: 02/05/21  2:06 AM   Specimen: Urine, Clean Catch  Result Value Ref Range Status   Specimen Description   Final    URINE, CLEAN CATCH Performed at [facility], [place] [address], [place], [place] [address]    Special Requests   Final    NONE Performed at [facility], [place] [address], [place], [place] [address]    Culture (A)  Final    <10,000 COLONIES/mL INSIGNIFICANT GROWTH Performed at [place] [address], [place], [place] [address]    Report Status 02/06/2021 FINAL  Final  Culture, blood (routine x 2)     Status: None (Preliminary result)   Collection Time: 02/05/21  5:17 AM   Specimen: BLOOD  Result Value Ref Range Status   Specimen  Description   Final    BLOOD RIGHT ANTECUBITAL Performed at [place] [address], [place], [place] [address]    Special Requests   Final    BOTTLES DRAWN AEROBIC ONLY Blood Culture adequate volume Performed at [place] [address] [place], [place] [address]    Culture   Final    NO GROWTH 4 DAYS Performed at [facility], [place] [address] [place], [place] [address]    Report Status PENDING  Incomplete  Aerobic/Anaerobic Culture w Gram Stain (surgical/deep wound)     Status: None (Preliminary result)   Collection Time: 02/07/21  1:07 PM   Specimen: Abscess  Result Value Ref Range Status   Specimen Description   Final    ABSCESS RIGHT UPPER QUADRANT Performed at [place] [address], [place], [place] [address]    Special Requests   Final    NONE Performed at [facility], [place] [address] [place], [place] [address]    Gram Stain   Final    ABUNDANT WBC PRESENT, PREDOMINANTLY PMN FEW GRAM POSITIVE RODS Performed at [facility], [place] [address], [place], [place] [address]    Culture   Final    MODERATE GRAM NEGATIVE RODS IDENTIFICATION AND SUSCEPTIBILITIES TO  FOLLOW CULTURE REINCUBATED FOR BETTER GROWTH NO ANAEROBES ISOLATED; CULTURE IN PROGRESS FOR 5 DAYS    Report Status PENDING  Incomplete  Culture, blood (routine x 2)     Status: None (Preliminary result)   Collection Time: 02/07/21  3:07 PM   Specimen: Right Antecubital; Blood  Result Value Ref Range Status   Specimen Description   Final    RIGHT ANTECUBITAL Performed at [facility], [place] [address], [place], [place] [address]    Special Requests   Final    BOTTLES DRAWN AEROBIC ONLY Blood Culture adequate volume Performed at [place] [address], [place], [place] [address]    Culture   Final    NO GROWTH 2 DAYS Performed at [place] [address], [place], [place] [address]    Report Status PENDING  Incomplete  Culture, blood (routine x 2)     Status: None (Preliminary result)   Collection Time: 02/07/21  3:15 PM   Specimen: BLOOD LEFT WRIST  Result Value Ref Range Status   Specimen Description   Final    BLOOD LEFT WRIST Performed at [place] [address] [place], [place] [address]    Special Requests   Final    BOTTLES DRAWN AEROBIC ONLY Blood Culture adequate volume Performed at [place] [address] [place], [place] [address]    Culture   Final    NO GROWTH 2 DAYS Performed at [place] [address] [place],  [address]    Report Status PENDING  Incomplete    Labs: Results for orders placed or performed during the hospital encounter of 02/04/21 (from the past 48 hour(s))  CBC     Status: Abnormal   Collection Time: 02/09/21  4:32 AM  Result Value Ref Range   WBC 5.8 4.0 - 10.5 K/uL   RBC 3.33 (L) 3.87 - 5.11 MIL/uL   Hemoglobin 10.0 (L) 12.0 - 15.0 g/dL   HCT 31.0 (L) 36.0 - 46.0 %   MCV 93.1 80.0 - 100.0 fL   MCH 30.0 26.0 - 34.0 pg   MCHC  32.3 30.0 - 36.0 g/dL   RDW 13.2 11.5 - 15.5 %   Platelets 214 150 - 400 K/uL   nRBC 0.0  0.0 - 0.2 %    Comment: Performed at [facility], [place] [address] [place], [place] [id number]  Basic metabolic panel     Status: Abnormal   Collection Time: 02/09/21  4:32 AM  Result Value Ref Range   Sodium 138 135 - 145 mmol/L   Potassium 4.4 3.5 - 5.1 mmol/L   Chloride 105 98 - 111 mmol/L   CO2 23 22 - 32 mmol/L   Glucose, Bld 88 70 - 99 mg/dL    Comment: Glucose reference range applies only to samples taken after fasting for at least 8 hours.   BUN 17 8 - 23 mg/dL   Creatinine, Ser 0.97 0.44 - 1.00 mg/dL   Calcium 8.6 (L) 8.9 - 10.3 mg/dL   GFR, Estimated 58 (L) >60 mL/min    Comment: (NOTE) Calculated using the CKD-EPI Creatinine Equation (2021)    Anion gap 10 5 - 15    Comment: Performed at [facility], [place] [address] [place], [place] [address]  CBC     Status: Abnormal   Collection Time: 02/10/21  4:18 AM  Result Value Ref Range   WBC 5.7 4.0 - 10.5 K/uL   RBC 3.55 (L) 3.87 - 5.11 MIL/uL   Hemoglobin 10.4 (L) 12.0 - 15.0 g/dL   HCT 32.7 (L) 36.0 - 46.0 %   MCV 92.1 80.0 - 100.0 fL   MCH 29.3 26.0 - 34.0 pg   MCHC 31.8 30.0 - 36.0 g/dL   RDW 13.1 11.5 - 15.5 %   Platelets 254 150 - 400 K/uL   nRBC 0.0 0.0 - 0.2 %    Comment: Performed at [facility], [place] [address] [place], [place] [id number]  Basic metabolic panel     Status: Abnormal   Collection Time: 02/10/21  4:18 AM  Result Value Ref Range   Sodium 140 135 - 145 mmol/L   Potassium 4.1 3.5 - 5.1 mmol/L   Chloride 108 98 - 111 mmol/L   CO2 22 22 - 32 mmol/L   Glucose, Bld 85 70 - 99 mg/dL    Comment: Glucose reference range applies only to samples taken after fasting for at least 8 hours.   BUN 13 8 - 23 mg/dL   Creatinine, Ser 0.88 0.44 - 1.00 mg/dL   Calcium 8.6 (L) 8.9 - 10.3 mg/dL   GFR, Estimated >60 >60 mL/min    Comment: (NOTE) Calculated using the CKD-EPI Creatinine Equation (2021)    Anion gap 10 5 - 15    Comment: Performed at [facility], [place] [address], [place], [place] [address]    Imaging / Studies: No results found.  Medications / Allergies: per chart  Antibiotics: Anti-infectives (From admission, onward)    Start     Dose/Rate Route Frequency Ordered Stop   02/08/21 1000  piperacillin-tazobactam (ZOSYN) IVPB 3.375 g        3.375 g 12.5 mL/hr over 240 Minutes Intravenous Every 8 hours 02/08/21 0854 02/12/21 1000   02/07/21 2200  vancomycin (VANCOCIN) IVPB 1000 mg/200 mL premix  Status:  Discontinued        1,000 mg 200 mL/hr over 60 Minutes Intravenous Every 24 hours 02/07/21 1400 02/08/21 0850   02/07/21 1415  ceFEPIme (MAXIPIME) 2 g in sodium chloride 0.9 % 100 mL IVPB  Status:  Discontinued        2 g 200 mL/hr over 30 Minutes Intravenous Every 12 hours 02/07/21 1400 02/08/21 0850   02/06/21 0200  vancomycin (VANCOREADY) IVPB 1250 mg/250 mL  Status:  Discontinued        1,250 mg 166.7 mL/hr over 90 Minutes Intravenous Every 24 hours 02/05/21 0227 02/07/21 1359   02/05/21 0030  vancomycin (VANCOCIN) IVPB 1000 mg/200 mL premix        1,000 mg 200 mL/hr over 60 Minutes Intravenous  Once 02/05/21 0015 02/05/21 0535   02/05/21 0030  ceFEPIme (MAXIPIME) 2 g in sodium chloride 0.9 % 100 mL IVPB  Status:  Discontinued        2 g 200 mL/hr over 30 Minutes Intravenous Every 24 hours 02/05/21 0015 02/07/21 1359         Note: Portions of this report may have been transcribed using voice recognition software. Every effort was made to ensure accuracy; however, inadvertent computerized transcription errors may be present.   Any transcriptional errors that result from this process are unintentional.    [name], MD, FACS, MASCRS Esophageal, Gastrointestinal & Colorectal Surgery Robotic and Minimally Invasive Surgery  [facility], [place]  [place] [place], [place] [address] [phone] Fax [phone]  Main  CONTACT INFORMATION:  Weekday (9AM-5PM): Call [facility] main office at [phone]  Weeknight (5PM-9AM) or Weekend/Holiday: Check [email or web address] (password " [email or web address]") for General Surgery [facility] coverage  (Please, do not use SecureChat as it is not reliable communication to operating surgeons for immediate patient care)      02/10/2021  7:08 AM

## 2021-02-10 NOTE — Progress Notes (Signed)
Physical Therapy Treatment Patient Details Name: Heather [name] MRN: [id number] DOB: [birth year] Today's Date: 02/10/2021    History of Present Illness 86 yo female admitted with UTI, hypotension, sepsis 2* post op cellulitis at incision site. S/P lap chole/hernia repair 01/25/21, CHF, DM, DVT, CAD, RLS    PT Comments    Pt very cooperative this am and in good spirits 2* drain removal and possible dc home this date.  Pt up to ambulate increased distance in hall with RW and sup for safety.  Pt did not use device prior to surgery but states she just feels more comfortable with RW at this time.   Follow Up Recommendations  Home health PT     Equipment Recommendations  None recommended by PT    Recommendations for Other Services       Precautions / Restrictions Precautions Precautions: Fall Precaution Comments: abdominal incision Restrictions Weight Bearing Restrictions: No    Mobility  Bed Mobility               General bed mobility comments: Pt up in chair and requests back to same    Transfers Overall transfer level: Needs assistance Equipment used: Rolling walker (2 wheeled) Transfers: Sit to/from Stand Sit to Stand: Supervision         General transfer comment: Supv for safety.  Ambulation/Gait Ambulation/Gait assistance: Min Gaffer (Feet): 450 Feet Assistive device: Rolling walker (2 wheeled) Gait Pattern/deviations: Step-through pattern;Decreased stride length Gait velocity: decr   General Gait Details: Pt declines to attempt ambulation sans RW stating she just feels more comfortable with RW at this time   Chief Strategy Officer    Modified Rankin (Stroke Patients Only)       Balance Overall balance assessment: Mild deficits observed, not formally tested                                          Cognition Arousal/Alertness: Awake/alert Behavior During Therapy: WFL for tasks  assessed/performed Overall Cognitive Status: Within Functional Limits for tasks assessed                                        Exercises      General Comments        Pertinent Vitals/Pain Pain Assessment: No/denies pain    Home Living                      Prior Function            PT Goals (current goals can now be found in the care plan section) Acute Rehab PT Goals Patient Stated Goal: walk- "this is what i need" PT Goal Formulation: With patient Time For Goal Achievement: 02/21/21 Potential to Achieve Goals: Good Progress towards PT goals: Progressing toward goals    Frequency    Min 3X/week      PT Plan Current plan remains appropriate    Co-evaluation              AM-PAC PT "6 Clicks" Mobility   Outcome Measure  Help needed turning from your back to your side while in a flat bed without using bedrails?: None Help needed moving from lying on your back  to sitting on the side of a flat bed without using bedrails?: None Help needed moving to and from a bed to a chair (including a wheelchair)?: A Little Help needed standing up from a chair using your arms (e.g., wheelchair or bedside chair)?: A Little Help needed to walk in hospital room?: A Little Help needed climbing 3-5 steps with a railing? : A Little 6 Click Score: 20    End of Session Equipment Utilized During Treatment: Gait belt Activity Tolerance: Patient tolerated treatment well Patient left: in chair;with call bell/phone within reach Nurse Communication: Mobility status PT Visit Diagnosis: Muscle weakness (generalized) (M62.81);Difficulty in walking, not elsewhere classified (R26.2)     Time: [id number] PT Time Calculation (min) (ACUTE ONLY): 18 min  Charges:  $Gait Training: 8-22 mins                     [place] Pager [phone] Office [phone]    Margee Trentham 02/10/2021, 9:40 AM

## 2021-02-10 NOTE — Consult Note (Signed)
[facility] Inpatient Consult   02/10/2021  MUSLIMA STROPES Dec 03, 1934 [id number]  [place] Organization [ACO] Patient: [facility] Medicare   Primary Care Provider:  Arnold Long at [facility]  Patient chart reviewed for [place] [facility] services due to high unplanned readmission risk score and unplanned 30 day readmission. PCP office has an embedded practice which has a chronic disease management embedded care management team.  Patient has been engaged by [facility] team.  Of note, [place] [facility] services does not replace or interfere with any services that are arranged by inpatient case management or social work.   Netta Cedars, MSN, [facility] Liaison Nurse Mobile Phone [phone]  Toll free office [phone]

## 2021-02-10 NOTE — Progress Notes (Signed)
   02/10/21 1048  Mobility  Level of Assistance Modified independent, requires aide device or extra time  Assistive Device Front wheel walker  Distance Ambulated (ft) 500 ft  Mobility Ambulated with assistance in hallway  Mobility Response Tolerated well  Mobility performed by Mobility specialist  $Mobility charge 1 Mobility    Pt ambulated with nurse prior to arrival, but wanted to walk some more. She ambulated in hallway about 531f with RW, tolerated well. Pt noted she felt a little tired but believes it's from multiple walks today. No other complaints. Left in chair with call bell and phone at side.    K[place]banySpecialist Acute Rehab Services Office: [phone]

## 2021-02-10 NOTE — Plan of Care (Signed)
Instructions were reviewed with patient. All questions were answered. Patient was transported to main entrance by wheelchair. ° °

## 2021-02-10 NOTE — Discharge Instructions (Signed)
WOUND CARE  It is important that the wound be kept open.   -Keeping the skin edges apart will allow the wound to gradually heal from the base upwards.   - If the skin edges of the wound close too early, a new fluid pocket can form and infection can occur. -This is the reason to pack deeper wounds with gauze or ribbon -This is why drained wounds cannot be sewed closed right away  A healthy wound should form a lining of bright red "beefy" granulating tissue that will help shrink the wound and help the edges grow new skin into it.   -A little mucus / yellow discharge is normal (the body's natural way to try and form a scab) and should be gently washed off with soap and water with daily dressing changes.  -Green or foul smelling drainage implies bacterial colonization and can slow wound healing - a short course of antibiotic ointment (3-5 days) can help it clear up.  Call the doctor if it does not improve or worsens  -Avoid use of antibiotic ointments for more than a week as they can slow wound healing over time.    -Sometimes other wound care products will be used to reduce need for dressing changes and/or help clean up dirty wounds -Sometimes the surgeon needs to debride the wound in the office to remove dead or infected tissue out of the wound so it can heal more quickly and safely.    Change the dressing at least once a day -Wash the wound with mild soap and water gently every day.  It is good to shower or bathe the wound to help it clean out. -Use clean ribbon plain NU-gauze for smaller wounds (it does not need to be sterile, just clean) -Keep the raw wound moist with a little saline or KY (saline) gel on the gauze.  -A dry wound will take longer to heal.  -Keep the skin dry around the wound to prevent breakdown and irritation. -Pack the wound down to the base -The goal is to keep the skin apart, not overpack the wound -Use a Q-tip or blunt-tipped kabob stick toothpick to push the gauze down  to the base in narrow or deep wounds   -Cover with a clean gauze and tape -paper or Medipore tape tend to be gentle on the skin -rotate the orientation of the tape to avoid repeated stress/trauma on the skin -using an ACE or Coban wrap on wounds on arms or legs can be used instead.  Complete all antibiotics through the entire prescription to help the infection heal and prevent new places of infection   Returning the see the surgeon is helpful to follow the healing process and help the wound close as fast as possible.   ##########################################   LAPAROSCOPIC SURGERY: POST OP INSTRUCTIONS  ######################################################################  EAT Gradually transition to a high fiber diet with a fiber supplement over the next few weeks after discharge.  Start with a pureed / full liquid diet (see below)  WALK Walk an hour a day.  Control your pain to do that.    CONTROL PAIN Control pain so that you can walk, sleep, tolerate sneezing/coughing, go up/down stairs.  HAVE A BOWEL MOVEMENT DAILY Keep your bowels regular to avoid problems.  OK to try a laxative to override constipation.  OK to use an antidairrheal to slow down diarrhea.  Call if not better after 2 tries  CALL IF YOU HAVE PROBLEMS/CONCERNS Call if you are still struggling  despite following these instructions. Call if you have concerns not answered by these instructions  ######################################################################    DIET: Follow a light bland diet & liquids the first 24 hours after arrival home, such as soup, liquids, starches, etc.  Be sure to drink plenty of fluids.  Quickly advance to a usual solid diet within a few days.  Avoid fast food or heavy meals as your are more likely to get nauseated or have irregular bowels.  A low-fat, high-fiber diet for the rest of your life is ideal.  Take your usually prescribed home medications unless otherwise  directed.  PAIN CONTROL: Pain is best controlled by a usual combination of three different methods TOGETHER: Ice/Heat Over the counter pain medication Prescription pain medication Most patients will experience some swelling and bruising around the incisions.  Ice packs or heating pads (30-60 minutes up to 6 times a day) will help. Use ice for the first few days to help decrease swelling and bruising, then switch to heat to help relax tight/sore spots and speed recovery.  Some people prefer to use ice alone, heat alone, alternating between ice & heat.  Experiment to what works for you.  Swelling and bruising can take several weeks to resolve.   It is helpful to take an over-the-counter pain medication regularly for the first few weeks.  Choose one of the following that works best for you: Naproxen (Aleve, etc)  Two '220mg'$  tabs twice a day Ibuprofen (Advil, etc) Three '200mg'$  tabs four times a day (every meal & bedtime) Acetaminophen (Tylenol, etc) 500-'650mg'$  four times a day (every meal & bedtime) A  prescription for pain medication (such as oxycodone, hydrocodone, tramadol, gabapentin, methocarbamol, etc) should be given to you upon discharge.  Take your pain medication as prescribed.  If you are having problems/concerns with the prescription medicine (does not control pain, nausea, vomiting, rash, itching, etc), please call us [phone] to see if we need to switch you to a different pain medicine that will work better for you and/or control your side effect better. If you need a refill on your pain medication, please give [place] 48 hour notice.  contact your pharmacy.  They will contact our office to request authorization. Prescriptions will not be filled after 5 pm or on week-ends  Avoid getting constipated.   Between the surgery and the pain medications, it is common to experience some constipation.   Increasing fluid intake and taking a fiber supplement (such as Metamucil, Citrucel, FiberCon,  MiraLax, etc) 1-2 times a day regularly will usually help prevent this problem from occurring.   A mild laxative (prune juice, Milk of Magnesia, MiraLax, etc) should be taken according to package directions if there are no bowel movements after 48 hours.   Watch out for diarrhea.   If you have many loose bowel movements, simplify your diet to bland foods & liquids for a few days.   Stop any stool softeners and decrease your fiber supplement.   Switching to mild anti-diarrheal medications (Kayopectate, Pepto Bismol) can help.   If this worsens or does not improve, please call us.  Wash / shower every day.  You may shower over the dressings as they are waterproof.  Continue to shower over incision(s) after the dressing is off.  ACTIVITIES as tolerated:   You may resume regular (light) daily activities beginning the next day--such as daily self-care, walking, climbing stairs--gradually increasing activities as tolerated.  If you can walk 30 minutes without difficulty, it is safe  to try more intense activity such as jogging, treadmill, bicycling, low-impact aerobics, swimming, etc. Save the most intensive and strenuous activity for last such as sit-ups, heavy lifting, contact sports, etc  Refrain from any heavy lifting or straining until you are off narcotics for pain control.   DO NOT PUSH THROUGH PAIN.  Let pain be your guide: If it hurts to do something, don't do it.  Pain is your body warning you to avoid that activity for another week until the pain goes down. You may drive when you are no longer taking prescription pain medication, you can comfortably wear a seatbelt, and you can safely maneuver your car and apply brakes. You may have sexual intercourse when it is comfortable.  FOLLOW UP in our office Please call [facility] at [phone] to set up an appointment to see your surgeon in the office for a follow-up appointment approximately 2-3 weeks after your surgery. Make sure that you call for this  appointment the day you arrive home to insure a convenient appointment time.  10. IF YOU HAVE DISABILITY OR FAMILY LEAVE FORMS, BRING THEM TO THE OFFICE FOR PROCESSING.  DO NOT GIVE THEM TO YOUR DOCTOR.   WHEN TO CALL us [phone]: Poor pain control Reactions / problems with new medications (rash/itching, nausea, etc)  Fever over 101.5 F (38.5 C) Inability to urinate Nausea and/or vomiting Worsening swelling or bruising Continued bleeding from incision. Increased pain, redness, or drainage from the incision   The clinic staff is available to answer your questions during regular business hours (8:30am-5pm).  Please don't hesitate to call and ask to speak to one of our nurses for clinical concerns.   If you have a medical emergency, go to the nearest emergency room or call 911.  A surgeon from [facility] is always on call at the [facility], [place], [place], [place], [place]  [address] ? MAIN: [phone] ? TOLL FREE: [phone] ?  FAX [phone] [email or web address]

## 2021-02-10 NOTE — TOC Transition Note (Signed)
Transition of Care [facility]) - CM/SW Discharge Note   Patient Details  Name: Heather [name] MRN: [id number] Date of Birth: [birth year]  Transition of Care [facility]) CM/SW Contact:  Lennart Pall, LCSW Phone Number: 02/10/2021, 1:10 PM   Clinical Narrative:    MD anticipating pt will be medically ready for dc today.  Have alerted [facility] of resumption of HHPT and addition of HHRN order.  Daughter in room and is aware of [place] arrangements and confirms she will be staying with her to assist.  No further TOC needs.  Please re-order TOC if needed.   Final next level of care: [place] Barriers to Discharge: Continued Medical Work up   Patient Goals and CMS Choice Patient states their goals for this hospitalization and ongoing recovery are:: return home      Discharge Placement                       Discharge Plan and Services                          HH Arranged: RN, PT [facility] Agency: [place] Date [facility] Agency Contacted: 02/10/21 Time [place]: 1030 Representative spoke with at [place]: [place] Interventions     Readmission Risk Interventions Readmission Risk Prevention Plan 02/08/2021  Transportation Screening Complete  Medication Review Press photographer) Complete  HRI or [place] Complete  SW Recovery Care/Counseling Consult Complete  [place] Not Applicable  Some recent data might be hidden

## 2021-02-12 LAB — CULTURE, BLOOD (ROUTINE X 2)
Culture: NO GROWTH
Culture: NO GROWTH
Special Requests: ADEQUATE
Special Requests: ADEQUATE

## 2021-02-12 LAB — AEROBIC/ANAEROBIC CULTURE W GRAM STAIN (SURGICAL/DEEP WOUND)

## 2021-02-13 DIAGNOSIS — J9601 Acute respiratory failure with hypoxia: Secondary | ICD-10-CM | POA: Diagnosis not present

## 2021-02-13 DIAGNOSIS — T8141XD Infection following a procedure, superficial incisional surgical site, subsequent encounter: Secondary | ICD-10-CM | POA: Diagnosis not present

## 2021-02-13 DIAGNOSIS — E119 Type 2 diabetes mellitus without complications: Secondary | ICD-10-CM | POA: Diagnosis not present

## 2021-02-13 DIAGNOSIS — I110 Hypertensive heart disease with heart failure: Secondary | ICD-10-CM | POA: Diagnosis not present

## 2021-02-13 DIAGNOSIS — I5033 Acute on chronic diastolic (congestive) heart failure: Secondary | ICD-10-CM | POA: Diagnosis not present

## 2021-02-13 DIAGNOSIS — N179 Acute kidney failure, unspecified: Secondary | ICD-10-CM | POA: Diagnosis not present

## 2021-02-13 DIAGNOSIS — I452 Bifascicular block: Secondary | ICD-10-CM | POA: Diagnosis not present

## 2021-02-13 DIAGNOSIS — I083 Combined rheumatic disorders of mitral, aortic and tricuspid valves: Secondary | ICD-10-CM | POA: Diagnosis not present

## 2021-02-13 DIAGNOSIS — I25118 Atherosclerotic heart disease of native coronary artery with other forms of angina pectoris: Secondary | ICD-10-CM | POA: Diagnosis not present

## 2021-02-15 ENCOUNTER — Telehealth: Payer: Self-pay | Admitting: Cardiovascular Disease

## 2021-02-15 DIAGNOSIS — J9601 Acute respiratory failure with hypoxia: Secondary | ICD-10-CM | POA: Diagnosis not present

## 2021-02-15 DIAGNOSIS — I110 Hypertensive heart disease with heart failure: Secondary | ICD-10-CM | POA: Diagnosis not present

## 2021-02-15 DIAGNOSIS — I25118 Atherosclerotic heart disease of native coronary artery with other forms of angina pectoris: Secondary | ICD-10-CM | POA: Diagnosis not present

## 2021-02-15 DIAGNOSIS — I5033 Acute on chronic diastolic (congestive) heart failure: Secondary | ICD-10-CM | POA: Diagnosis not present

## 2021-02-15 DIAGNOSIS — N179 Acute kidney failure, unspecified: Secondary | ICD-10-CM | POA: Diagnosis not present

## 2021-02-15 DIAGNOSIS — T8141XD Infection following a procedure, superficial incisional surgical site, subsequent encounter: Secondary | ICD-10-CM | POA: Diagnosis not present

## 2021-02-15 DIAGNOSIS — I452 Bifascicular block: Secondary | ICD-10-CM | POA: Diagnosis not present

## 2021-02-15 DIAGNOSIS — I083 Combined rheumatic disorders of mitral, aortic and tricuspid valves: Secondary | ICD-10-CM | POA: Diagnosis not present

## 2021-02-15 DIAGNOSIS — E119 Type 2 diabetes mellitus without complications: Secondary | ICD-10-CM | POA: Diagnosis not present

## 2021-02-15 NOTE — Telephone Encounter (Signed)
I spoke with patient's daughter.  She reports patient has been hospitalized twice recently. First hospitalization was for CHF.  Family was not aware patient had CHF.  Fluid received after surgery contributed to CHF. She was to follow up with cardiology but was readmitted with infection. Daughter wanted to make Dr Burt Knack aware losartan and spironolactone were stopped during first hospitalization.  Toprol was stopped during second admission due to heart rate in the 50-60 range. Currently heart rate is running 50-70.  Patient feels fluttering at times. Patient is not having shortness of breath and weight is stable. Follow up appointment has not been scheduled and daughter prefers patient see Dr Burt Knack.  I told daughter I would send update to Dr Burt Knack

## 2021-02-15 NOTE — Telephone Encounter (Signed)
Patient's daughter is calling to get hospital f/u for heart failure and patient's daughter is looking toto get an appointment for her mother as soon as possible dr cooper. daughter stated the patient been seen the PA but they never told them that the patient has heart failure.  is there anyway that she could see dr cooper anytime soon. Also they were told in the hospital patient shouldn't be on certain medicine with the heart failure that she has. So they would like to discuss that as well. Please advise

## 2021-02-16 ENCOUNTER — Other Ambulatory Visit: Payer: Self-pay

## 2021-02-16 ENCOUNTER — Telehealth (INDEPENDENT_AMBULATORY_CARE_PROVIDER_SITE_OTHER): Payer: Medicare HMO | Admitting: Family Medicine

## 2021-02-16 ENCOUNTER — Encounter: Payer: Self-pay | Admitting: Family Medicine

## 2021-02-16 ENCOUNTER — Telehealth: Payer: Self-pay | Admitting: *Deleted

## 2021-02-16 ENCOUNTER — Other Ambulatory Visit: Payer: Medicare HMO

## 2021-02-16 ENCOUNTER — Other Ambulatory Visit: Payer: Self-pay | Admitting: *Deleted

## 2021-02-16 DIAGNOSIS — K219 Gastro-esophageal reflux disease without esophagitis: Secondary | ICD-10-CM

## 2021-02-16 DIAGNOSIS — R251 Tremor, unspecified: Secondary | ICD-10-CM

## 2021-02-16 DIAGNOSIS — R300 Dysuria: Secondary | ICD-10-CM | POA: Diagnosis not present

## 2021-02-16 DIAGNOSIS — E1121 Type 2 diabetes mellitus with diabetic nephropathy: Secondary | ICD-10-CM | POA: Diagnosis not present

## 2021-02-16 DIAGNOSIS — R519 Headache, unspecified: Secondary | ICD-10-CM

## 2021-02-16 DIAGNOSIS — I2510 Atherosclerotic heart disease of native coronary artery without angina pectoris: Secondary | ICD-10-CM | POA: Diagnosis not present

## 2021-02-16 DIAGNOSIS — D649 Anemia, unspecified: Secondary | ICD-10-CM

## 2021-02-16 DIAGNOSIS — I10 Essential (primary) hypertension: Secondary | ICD-10-CM | POA: Diagnosis not present

## 2021-02-16 DIAGNOSIS — R739 Hyperglycemia, unspecified: Secondary | ICD-10-CM | POA: Diagnosis not present

## 2021-02-16 DIAGNOSIS — R1310 Dysphagia, unspecified: Secondary | ICD-10-CM | POA: Insufficient documentation

## 2021-02-16 DIAGNOSIS — I5032 Chronic diastolic (congestive) heart failure: Secondary | ICD-10-CM

## 2021-02-16 DIAGNOSIS — G4700 Insomnia, unspecified: Secondary | ICD-10-CM

## 2021-02-16 DIAGNOSIS — A419 Sepsis, unspecified organism: Secondary | ICD-10-CM

## 2021-02-16 DIAGNOSIS — K449 Diaphragmatic hernia without obstruction or gangrene: Secondary | ICD-10-CM

## 2021-02-16 DIAGNOSIS — N3001 Acute cystitis with hematuria: Secondary | ICD-10-CM

## 2021-02-16 LAB — VITAMIN D 25 HYDROXY (VIT D DEFICIENCY, FRACTURES): VITD: 43.74 ng/mL (ref 30.00–100.00)

## 2021-02-16 LAB — POC URINALSYSI DIPSTICK (AUTOMATED)
Bilirubin, UA: NEGATIVE
Glucose, UA: NEGATIVE
Ketones, UA: NEGATIVE
Nitrite, UA: POSITIVE
Protein, UA: POSITIVE — AB
Spec Grav, UA: 1.015 (ref 1.010–1.025)
Urobilinogen, UA: 0.2 E.U./dL
pH, UA: 6 (ref 5.0–8.0)

## 2021-02-16 LAB — CBC WITH DIFFERENTIAL/PLATELET
Basophils Absolute: 0 10*3/uL (ref 0.0–0.1)
Basophils Relative: 0.5 % (ref 0.0–3.0)
Eosinophils Absolute: 0.1 10*3/uL (ref 0.0–0.7)
Eosinophils Relative: 0.6 % (ref 0.0–5.0)
HCT: 37 % (ref 36.0–46.0)
Hemoglobin: 12.2 g/dL (ref 12.0–15.0)
Lymphocytes Relative: 17.1 % (ref 12.0–46.0)
Lymphs Abs: 1.6 10*3/uL (ref 0.7–4.0)
MCHC: 32.8 g/dL (ref 30.0–36.0)
MCV: 91.9 fl (ref 78.0–100.0)
Monocytes Absolute: 0.6 10*3/uL (ref 0.1–1.0)
Monocytes Relative: 7 % (ref 3.0–12.0)
Neutro Abs: 6.8 10*3/uL (ref 1.4–7.7)
Neutrophils Relative %: 74.8 % (ref 43.0–77.0)
Platelets: 223 10*3/uL (ref 150.0–400.0)
RBC: 4.03 Mil/uL (ref 3.87–5.11)
RDW: 14.4 % (ref 11.5–15.5)
WBC: 9.1 10*3/uL (ref 4.0–10.5)

## 2021-02-16 LAB — BASIC METABOLIC PANEL
BUN: 13 mg/dL (ref 6–23)
CO2: 28 mEq/L (ref 19–32)
Calcium: 9.1 mg/dL (ref 8.4–10.5)
Chloride: 95 mEq/L — ABNORMAL LOW (ref 96–112)
Creatinine, Ser: 0.91 mg/dL (ref 0.40–1.20)
GFR: 57.99 mL/min — ABNORMAL LOW (ref >60.00)
Glucose, Bld: 137 mg/dL — ABNORMAL HIGH (ref 70–99)
Potassium: 4.1 mEq/L (ref 3.5–5.1)
Sodium: 133 mEq/L — ABNORMAL LOW (ref 135–145)

## 2021-02-16 MED ORDER — CEFDINIR 300 MG PO CAPS: 300.0000 mg | ORAL_CAPSULE | Freq: Two times a day (BID) | ORAL | 0 refills | Status: AC

## 2021-02-16 NOTE — Telephone Encounter (Signed)
I'm adding a clinic next Thursday 8/18, please add her to that clinic if that works for her schedule. Thank you

## 2021-02-16 NOTE — Progress Notes (Signed)
MyChart Video Visit    Virtual Visit via Video Note   This visit type was conducted due to national recommendations for restrictions regarding the COVID-19 Pandemic (e.g. social distancing) in an effort to limit this patient's exposure and mitigate transmission in our community. This patient is at least at moderate risk for complications without adequate follow up. This format is felt to be most appropriate for this patient at this time. Physical exam was limited by quality of the video and audio technology used for the visit. S Chism, CMA was able to get the patient set up on a video visit.  Patient location: home Patient, her daughter and provider in visit Provider location: Office  I discussed the limitations of evaluation and management by telemedicine and the availability of in person appointments. The patient expressed understanding and agreed to proceed.  Visit Date: 02/16/2021  Today's healthcare provider: [name], MD     Subjective:    Patient ID: Heather [name], female    DOB: Feb 19, 1935, 86 y.o.   MRN: [id number]  No chief complaint on file.   HPI Patient is in today for follow up on chronic medical concerns after recent hospitalization.  Patient was recently released from the hospital after an episode of sepsis after surgery.  She is doing some better but does still endorse nausea with occasional vomiting.  Her rigors are gone but she does note a tremor has worsened and a head bob is also worsened.  She is having increasing headaches as well.  She continues to struggle with dysphagia and food getting stuck.  She had a spike in her sugars during her hospitalization and is trying to watch her carbohydrate intake.  She had an episode of congestive heart failure during her hospitalization which was new for her she is breathing better since returning home but is frustrated with her level of weakness and debility. Denies CP/palp/SOB/HA/congestion/fevers/GI or GU c/o. Taking  meds as prescribed   Past Medical History:  Diagnosis Date   Chronic stable angina [facility])    Coronary artery disease cardiologist--- dr Burt Knack   hx NSTEMI  w/ cardiac cath 03-16-2005 PCI with DES to LCx;   cardiac cath 06-27-2005  PCI with DES to RCA with residual dx LAD manage medcially;  lexiscan 01-26-20211 normal no ischemia, ef 70%;  stress echo w/ dobutamine 10-24-2011 negative ishcmeia , normal ef // Myoview 2/22: EF 67, no ischemia or infarction, no TID, low risk    Diabetes mellitus type 2, diet-controlled ([place])    followed by pcp  (10-19-2020  pt stated checks daily in am,  fasting blood surgar--- 115--120s)   DOE (dyspnea on exertion)    per pt "when I over do",  ok with household chores   Echocardiogram 08/2020    Echocardiogram 2/22: EF 55-60, no RWMA, mild LVH, Gr 2 DD, GLS-21.7%, normal RVSF, trivial MR, RVSP 39.5   Edema of right lower extremity    GERD (gastroesophageal reflux disease)    Hiatal hernia    recurrence,  hx HH repair 1989   History of cervical cancer    s/p  vaginal hysterectomy   History of DVT of lower extremity 2016   11-29-2014 post op right TKA of right lower extremity and completed xarelto    History of esophageal stricture    hx s/p dilatation's   History of gastric ulcer 2005 approx.   History of palpitations 2010   event monitor 07-07-2009 showed NSR w/ freq. SVT ectopies with short  runs, rare PVCs   History of TIA (transient ischemic attack) 06/1999   12-15-2019  per pt had several TIA between 12/ 2000 to 02/ 2001 , was sent to specialist '@[facility]' , had test that was normal (10-19-2020 pt stated no TIAs since ) but has residual of essential tremors of right arm/ hand   Hypertension    Intermittent palpitations    IT band syndrome    Migraine    "ice pick headche lasts about 30 seconds"   Mixed hyperlipidemia    Mixed incontinence urge and stress    Multiple thyroid nodules    followed by pcp---   ultrasound 11-22-2014 no bx   (12-15-2019 per pt  had a endocrinologist and was told did not need bx)   OA (osteoarthritis)    knees, elbow, hip, ankles   Occasional tremors    right arm/ hand  s/p TIA residual 2000   Osteoporosis    taking vitamin d   Peroneal DVT (deep venous thrombosis) ([place]) 12/22/2014   Right bundle branch block (RBBB) with left anterior fascicular block (LAFB)    RLS (restless legs syndrome)    S/P drug eluting coronary stent placement 2006   03-16-2005  PCI x1 DES to LCx;   06-27-2005  PCI x1 DES to RCA   Urinary retention    post op sling prodecure on 10-27-2020, has foley cathether    Past Surgical History:  Procedure Laterality Date   ANTERIOR AND POSTERIOR REPAIR N/A 12/22/2019   Procedure: ANTERIOR (CYSTOCELE)  REPAIR;  Surgeon: Janyth Contes, MD;  Location: [place];  Service: Gynecology;  Laterality: N/A;   ANTERIOR AND POSTERIOR REPAIR WITH SACROSPINOUS FIXATION N/A 10/27/2020   Procedure: SACROSPINOUS LIGAMENT FIXATION;  Surgeon: Jaquita Folds, MD;  Location: [facility];  Service: Gynecology;  Laterality: N/A;   BLADDER SUSPENSION N/A 10/27/2020   Procedure: TRANSVAGINAL TAPE (TVT) PROCEDURE;  Surgeon: Jaquita Folds, MD;  Location: [facility];  Service: Gynecology;  Laterality: N/A;   CATARACT EXTRACTION W/ INTRAOCULAR LENS  IMPLANT, BILATERAL  2015   CHOLECYSTECTOMY N/A 01/25/2021   Procedure: LAPAROSCOPIC CHOLECYSTECTOMY WITH INTRAOPERATIVE CHOLANGIOGRAM AND LYSIS OF ADHESIONS;  Surgeon: Michael Boston, MD;  Location: [facility];  Service: General;  Laterality: N/A;   COLONOSCOPY  last one ?   CORONARY ANGIOPLASTY WITH STENT PLACEMENT  03-16-2005   dr Lia Foyer   PCI and DES x1 to LCx   CORONARY ANGIOPLASTY WITH STENT PLACEMENT  06-27-2005  dr Lia Foyer   PCI and DES x1 to RCA with residual disease LAD 70-80% to manage medically   CYSTOSCOPY N/A 10/27/2020   Procedure: CYSTOSCOPY;  Surgeon: Jaquita Folds, MD;  Location: [facility];  Service: Gynecology;  Laterality: N/A;   CYSTOSCOPY N/A 11/09/2020   Procedure: CYSTOSCOPY;  Surgeon: Jaquita Folds, MD;  Location: [facility];  Service: Gynecology;  Laterality: N/A;   FOOT SURGERY Left 1990s   left foot stress fracture repair, per pt no hardware   HIATAL HERNIA REPAIR  1989   INCISIONAL HERNIA REPAIR N/A 01/25/2021   Procedure: PRIMARY REPAIR OF INCISIONAL HERNIA;  Surgeon: Michael Boston, MD;  Location: [facility];  Service: General;  Laterality: N/A;   KNEE ARTHROSCOPY Bilateral right ?/   left x2 , last one 09-12-2009 @ [place]   PUBOVAGINAL SLING N/A 11/09/2020   Procedure: [place];  Surgeon: Jaquita Folds, MD;  Location: [facility];  Service: Gynecology;  Laterality: N/A;   RECTOCELE REPAIR N/A 04/20/2020   Procedure: POSTERIOR REPAIR (RECTOCELE);  Surgeon: Janyth Contes, MD;  Location: [facility];  Service: Gynecology;  Laterality: N/A;   RECTOCELE REPAIR N/A 10/27/2020   Procedure: POSTERIOR REPAIR (RECTOCELE);  Surgeon: Jaquita Folds, MD;  Location: [facility];  Service: Gynecology;  Laterality: N/A;  total time requested for all procedures is 2 hours   TOTAL KNEE ARTHROPLASTY  11/12/2011   Procedure: TOTAL KNEE ARTHROPLASTY;  Surgeon: Lorn Junes, MD;  Location: [place];  Service: Orthopedics;  Laterality: Left;  Dr Noemi Chapel wants 90 minutes for this case   TOTAL KNEE ARTHROPLASTY Right 11/29/2014   Procedure: RIGHT TOTAL KNEE ARTHROPLASTY;  Surgeon: Vickey Huger, MD;  Location: [place];  Service: Orthopedics;  Laterality: Right;   UPPER GASTROINTESTINAL ENDOSCOPY  last one 04-25-2017   with dilatation esophageal stricture and savary dilatation   VAGINAL HYSTERECTOMY  1988    no ovaries removed for bleeeding    Family History  Problem Relation Age of Onset   Stroke Father        family hx of M 1st degree relative <50   Coronary artery disease  Mother    Heart disease Mother    Depression Brother    Stroke Brother    Diabetes Brother    Cancer Brother        bladder with mets   Diabetes Daughter        borderline   Hypertension Daughter    Arthritis Other        family hx of   Hypertension Other        family hx of   Other Other        family hx of cardiovascular disorder   Thyroid disease Daughter    Breast cancer Neg Hx    Colon cancer Neg Hx    Anesthesia problems Neg Hx    Hypotension Neg Hx    Malignant hyperthermia Neg Hx    Pseudochol deficiency Neg Hx    Colon polyps Neg Hx    Esophageal cancer Neg Hx    Rectal cancer Neg Hx    Stomach cancer Neg Hx     Social History   Socioeconomic History   Marital status: Widowed    Spouse name: Not on file   Number of children: 3   Years of education: 22   Highest education level: Not on file  Occupational History   Occupation: works partime in Office manager: RETIRED    Comment: retired  Tobacco Use   Smoking status: Never   Smokeless tobacco: Never  Vaping Use   Vaping Use: Never used  Substance and Sexual Activity   Alcohol use: No    Alcohol/week: 0.0 standard drinks   Drug use: Never   Sexual activity: Not Currently    Birth control/protection: Surgical    Comment: lives alone  Other Topics Concern   Not on file  Social History Narrative   Lives with husband, Caffeine use- Half Caffeine)- 2 cups daily.  3 children living, one passed away.   Education: HS. Business college.  Retired.    Social Determinants of Health   Financial Resource Strain: Low Risk    Difficulty of Paying Living Expenses: Not hard at all  Food Insecurity: Not on file  Transportation Needs: Not on file  Physical Activity: Inactive   Days of Exercise per Week: 0 days   Minutes of Exercise per  Session: 0 min  Stress: Not on file  Social Connections: Not on file  Intimate Partner Violence: Not on file    Outpatient Medications Prior to Visit  Medication Sig  Dispense Refill   acetaminophen (TYLENOL) 500 MG tablet Take 1,000 mg by mouth every 6 (six) hours as needed for moderate pain.     amLODipine (NORVASC) 10 MG tablet TAKE 1 TABLET EVERY DAY 90 tablet 1   Ascorbic Acid (VITAMIN C) 500 MG CHEW Chew 500 mg by mouth daily.     aspirin EC 81 MG tablet Take 1 tablet (81 mg total) by mouth daily.     Blood Glucose Monitoring Suppl (TRUE METRIX AIR GLUCOSE METER) w/Device KIT USE TO CHECK BLOOD SUGAR ONCE DAILY AND AS NEEDED.  DX CODE E11.9 1 kit 0   Cholecalciferol (VITAMIN D) 50 MCG (2000 UT) CAPS Take 2,000 Units by mouth daily.      diclofenac Sodium (VOLTAREN) 1 % GEL Apply 1 application topically 4 (four) times daily as needed (hip pain).     famotidine (PEPCID) 20 MG tablet Take 20 mg by mouth at bedtime as needed for heartburn or indigestion.     feeding supplement, GLUCERNA SHAKE, (GLUCERNA SHAKE) LIQD Take 237 mLs by mouth 2 (two) times daily between meals.  0   furosemide (LASIX) 20 MG tablet Take 1 tablet (20 mg total) by mouth daily as needed for edema (weight gain greater than 3 pounds in 1 day or 5 pounds in 1 week). 30 tablet 2   glucose blood (TRUE METRIX BLOOD GLUCOSE TEST) test strip USE TO CHECK BLOOD SUGAR ONCE A DAY OR AS NEEDED.  DX CODE: E11.9 200 each 1   isosorbide mononitrate (IMDUR) 60 MG 24 hr tablet Take 1.5 tablets (90 mg total) by mouth daily. 135 tablet 3   ketorolac (TORADOL) 10 MG tablet Take 1 tablet (10 mg total) by mouth every 6 (six) hours as needed. 20 tablet 0   magnesium oxide (MAG-OX) 400 MG tablet Take 400 mg by mouth at bedtime.     mirabegron ER (MYRBETRIQ) 25 MG TB24 tablet Take 1 tablet (25 mg total) by mouth daily. 90 tablet 2   nitroGLYCERIN (NITROSTAT) 0.4 MG SL tablet Place 1 tablet (0.4 mg total) under the tongue every 5 (five) minutes as needed for chest pain. 25 tablet 11   omeprazole (PRILOSEC) 20 MG capsule Take 1 capsule (20 mg total) by mouth in the morning.     ondansetron (ZOFRAN ODT) 4 MG  disintegrating tablet Take 1 tablet (4 mg total) by mouth every 6 (six) hours as needed for nausea or vomiting. 30 tablet 0   oxyCODONE (OXY IR/ROXICODONE) 5 MG immediate release tablet Take 1-2 tablets (5-10 mg total) by mouth every 8 (eight) hours as needed for moderate pain or severe pain. 20 tablet 0   potassium chloride SA (KLOR-CON) 20 MEQ tablet Take 0.5 tablets (10 mEq total) by mouth daily. 45 tablet 2   pramipexole (MIRAPEX) 0.25 MG tablet Take 1 tablet (0.25 mg total) by mouth 2 (two) times daily. 180 tablet 1   simvastatin (ZOCOR) 20 MG tablet TAKE 1 TABLET AT BEDTIME 90 tablet 2   TRUEplus Lancets 33G MISC USE TO CHECK BLOOD SUGAR ONCE DAILY AND AS NEEDED.  DX CODE: E11.9 200 each 1   Zinc 50 MG TABS Take 50 mg by mouth daily.     No facility-administered medications prior to visit.    Allergies  Allergen Reactions   Baclofen  Other (See Comments)    Hyperactivity    Nitrofurantoin Rash   Tramadol Other (See Comments)    Hallucinations     Review of Systems  Constitutional:  Positive for chills and malaise/fatigue. Negative for fever.  HENT:  Negative for congestion.   Eyes:  Negative for blurred vision.  Respiratory:  Negative for shortness of breath.   Cardiovascular:  Negative for chest pain, palpitations and leg swelling.  Gastrointestinal:  Negative for abdominal pain, blood in stool and nausea.  Genitourinary:  Positive for dysuria, frequency and urgency. Negative for flank pain and hematuria.  Musculoskeletal:  Negative for falls.  Skin:  Negative for rash.  Neurological:  Positive for tremors and headaches. Negative for dizziness and loss of consciousness.  Endo/Heme/Allergies:  Negative for environmental allergies.  Psychiatric/Behavioral:  Negative for depression. The patient is not nervous/anxious.       Objective:    Physical Exam Constitutional:      General: She is not in acute distress.    Appearance: Normal appearance. She is not ill-appearing or  toxic-appearing.  HENT:     Head: Normocephalic and atraumatic.     Right Ear: External ear normal.     Left Ear: External ear normal.     Nose: Nose normal.  Eyes:     General:        Right eye: No discharge.        Left eye: No discharge.  Pulmonary:     Effort: Pulmonary effort is normal.  Skin:    Findings: No rash.  Neurological:     Mental Status: She is alert and oriented to person, place, and time.  Psychiatric:        Behavior: Behavior normal.    BP (!) 153/77   Pulse 87   Temp 98.4 F (36.9 C)   Resp 16   Wt 152 lb 12.8 oz (69.3 kg)   LMP  (LMP Unknown)   SpO2 96%   BMI 27.95 kg/m  Wt Readings from Last 3 Encounters:  02/16/21 152 lb 12.8 oz (69.3 kg)  02/04/21 152 lb (68.9 kg)  02/01/21 159 lb 13.3 oz (72.5 kg)    Diabetic Foot Exam - Simple   No data filed    Lab Results  Component Value Date   WBC 9.1 02/16/2021   HGB 12.2 02/16/2021   HCT 37.0 02/16/2021   PLT 223.0 02/16/2021   GLUCOSE 137 (H) 02/16/2021   CHOL 150 07/14/2020   TRIG 117.0 07/14/2020   HDL 62.10 07/14/2020   LDLDIRECT 73.4 05/20/2014   LDLCALC 65 07/14/2020   ALT 18 02/08/2021   AST 27 02/08/2021   NA 133 (L) 02/16/2021   K 4.1 02/16/2021   CL 95 (L) 02/16/2021   CREATININE 0.91 02/16/2021   BUN 13 02/16/2021   CO2 28 02/16/2021   TSH 2.166 01/31/2021   INR 1.1 02/07/2021   HGBA1C 7.9 (H) 01/17/2021   MICROALBUR <0.7 05/30/2018    Lab Results  Component Value Date   TSH 2.166 01/31/2021   Lab Results  Component Value Date   WBC 9.1 02/16/2021   HGB 12.2 02/16/2021   HCT 37.0 02/16/2021   MCV 91.9 02/16/2021   PLT 223.0 02/16/2021   Lab Results  Component Value Date   NA 133 (L) 02/16/2021   K 4.1 02/16/2021   CO2 28 02/16/2021   GLUCOSE 137 (H) 02/16/2021   BUN 13 02/16/2021   CREATININE 0.91 02/16/2021   BILITOT 0.7 02/08/2021  ALKPHOS 114 02/08/2021   AST 27 02/08/2021   ALT 18 02/08/2021   PROT 5.5 (L) 02/08/2021   ALBUMIN 2.6 (L) 02/08/2021    CALCIUM 9.1 02/16/2021   ANIONGAP 10 02/10/2021   EGFR 59 (L) 10/03/2020   GFR 57.99 (L) 02/16/2021   Lab Results  Component Value Date   CHOL 150 07/14/2020   Lab Results  Component Value Date   HDL 62.10 07/14/2020   Lab Results  Component Value Date   LDLCALC 65 07/14/2020   Lab Results  Component Value Date   TRIG 117.0 07/14/2020   Lab Results  Component Value Date   CHOLHDL 2 07/14/2020   Lab Results  Component Value Date   HGBA1C 7.9 (H) 01/17/2021       Assessment & Plan:   Problem List Items Addressed This Visit     Essential hypertension (Chronic)    Slightly increased today but they note it has been better most days they will continue to monitor and let us know her next reading at rest. Minimize sodium      DM (diabetes mellitus) (HCC) (Chronic)    hgba1c acceptable, minimize simple carbs. Increase exercise as tolerated. Continue current meds. Her A1C last month had climbed to 7.9 with her surgeries and sepsis. She is advised to minimize carbs and add protein to each meal. Recheck in 2 months and monitor bp at home      CORONARY ATHEROSCLEROSIS NATIVE CORONARY ARTERY   Gastroesophageal reflux disease with hiatal hernia   Relevant Orders   Ambulatory referral to Gastroenterology   Anemia   Relevant Orders   CBC with Differential/Platelet (Completed)   Insomnia    Encouraged good sleep hygiene such as dark, quiet room. No blue/green glowing lights such as computer screens in bedroom. No alcohol or stimulants in evening. Cut down on caffeine as able. Regular exercise is helpful but not just prior to bed time.       Headache   Relevant Orders   Ambulatory referral to Neurology   UTI (urinary tract infection)    Some urinary frequency and dysuria. Start on Cefdinir and await culture results. Add Azo cranberry tabs and probiotics      Relevant Medications   cefdinir (OMNICEF) 300 MG capsule   Sepsis ([place])    After surgery she became acutely ill  and was very sick. She is feeling better but she still endorses fatigue, anorexia, malaise. She has not noted increasing protein in diet yet. Check labs      Chronic diastolic CHF (congestive heart failure) ([place])    She had fluid overload after her surgery and is now doing better with the switch from Spironolactone to Furosemide.       Dysphagia    Recent UGI while she was in the hospital showed an irregularity in the esophagus and HH with reflux. Her ability to swallow is worsening and she is noting some persistnet anorexia, nausea and even occasional episode of vomiting as well. Have referred back to her gastroenterologists for further consideration      Tremor    Since a recent episode of sepsis her tremor and head bob have worsened. She is referred to neurology for evaluation      Relevant Orders   Ambulatory referral to Neurology   Ambulatory referral to Gastroenterology   Other Visit Diagnoses     Hypocalcemia    -  Primary   Relevant Orders   VITAMIN D 25 Hydroxy (Vit-D Deficiency, Fractures) (Completed)  Dysuria       Elevated blood sugar           I am having Latanya M. Aguinaldo start on cefdinir. I am also having her maintain her magnesium oxide, Vitamin D, aspirin EC, Vitamin C, ondansetron, True Metrix Air Glucose Meter, True Metrix Blood Glucose Test, TRUEplus Lancets 33G, famotidine, nitroGLYCERIN, simvastatin, potassium chloride SA, pramipexole, acetaminophen, ketorolac, isosorbide mononitrate, Zinc, diclofenac Sodium, mirabegron ER, amLODipine, furosemide, oxyCODONE, feeding supplement (GLUCERNA SHAKE), and omeprazole.  Meds ordered this encounter  Medications   cefdinir (OMNICEF) 300 MG capsule    Sig: Take 1 capsule (300 mg total) by mouth 2 (two) times daily for 10 days.    Dispense:  10 capsule    Refill:  0    I discussed the assessment and treatment plan with the patient. The patient was provided an opportunity to ask questions and all were answered. The  patient agreed with the plan and demonstrated an understanding of the instructions.   The patient was advised to call back or seek an in-person evaluation if the symptoms worsen or if the condition fails to improve as anticipated.  I provided 22 minutes of face-to-face time during this encounter.   [name], MD [facility] at [facility] [phone] (phone) [phone] (fax)  [place]

## 2021-02-16 NOTE — Telephone Encounter (Signed)
Patient came in today and stated that she has a vv later today and she is having some dysuria.  Future orders placed.

## 2021-02-16 NOTE — Assessment & Plan Note (Signed)
hgba1c acceptable, minimize simple carbs. Increase exercise as tolerated. Continue current meds. Her A1C last month had climbed to 7.9 with her surgeries and sepsis. She is advised to minimize carbs and add protein to each meal. Recheck in 2 months and monitor bp at home

## 2021-02-16 NOTE — Assessment & Plan Note (Signed)
Encouraged good sleep hygiene such as dark, quiet room. No blue/green glowing lights such as computer screens in bedroom. No alcohol or stimulants in evening. Cut down on caffeine as able. Regular exercise is helpful but not just prior to bed time.  

## 2021-02-16 NOTE — Assessment & Plan Note (Signed)
After surgery she became acutely ill and was very sick. She is feeling better but she still endorses fatigue, anorexia, malaise. She has not noted increasing protein in diet yet. Check labs

## 2021-02-16 NOTE — Assessment & Plan Note (Signed)
Recent UGI while she was in the hospital showed an irregularity in the esophagus and HH with reflux. Her ability to swallow is worsening and she is noting some persistnet anorexia, nausea and even occasional episode of vomiting as well. Have referred back to her gastroenterologists for further consideration

## 2021-02-16 NOTE — Assessment & Plan Note (Signed)
Slightly increased today but they note it has been better most days they will continue to monitor and let us know her next reading at rest. Minimize sodium

## 2021-02-16 NOTE — Assessment & Plan Note (Signed)
Since a recent episode of sepsis her tremor and head bob have worsened. She is referred to neurology for evaluation

## 2021-02-16 NOTE — Assessment & Plan Note (Signed)
Some urinary frequency and dysuria. Start on Cefdinir and await culture results. Add Azo cranberry tabs and probiotics

## 2021-02-16 NOTE — Assessment & Plan Note (Signed)
She had fluid overload after her surgery and is now doing better with the switch from Spironolactone to Furosemide.

## 2021-02-17 ENCOUNTER — Other Ambulatory Visit (INDEPENDENT_AMBULATORY_CARE_PROVIDER_SITE_OTHER): Payer: Medicare HMO

## 2021-02-17 DIAGNOSIS — N179 Acute kidney failure, unspecified: Secondary | ICD-10-CM | POA: Diagnosis not present

## 2021-02-17 DIAGNOSIS — I083 Combined rheumatic disorders of mitral, aortic and tricuspid valves: Secondary | ICD-10-CM | POA: Diagnosis not present

## 2021-02-17 DIAGNOSIS — E119 Type 2 diabetes mellitus without complications: Secondary | ICD-10-CM | POA: Diagnosis not present

## 2021-02-17 DIAGNOSIS — R770 Abnormality of albumin: Secondary | ICD-10-CM

## 2021-02-17 DIAGNOSIS — I25118 Atherosclerotic heart disease of native coronary artery with other forms of angina pectoris: Secondary | ICD-10-CM | POA: Diagnosis not present

## 2021-02-17 DIAGNOSIS — I5033 Acute on chronic diastolic (congestive) heart failure: Secondary | ICD-10-CM | POA: Diagnosis not present

## 2021-02-17 DIAGNOSIS — I110 Hypertensive heart disease with heart failure: Secondary | ICD-10-CM | POA: Diagnosis not present

## 2021-02-17 DIAGNOSIS — T8141XD Infection following a procedure, superficial incisional surgical site, subsequent encounter: Secondary | ICD-10-CM | POA: Diagnosis not present

## 2021-02-17 DIAGNOSIS — I452 Bifascicular block: Secondary | ICD-10-CM | POA: Diagnosis not present

## 2021-02-17 DIAGNOSIS — J9601 Acute respiratory failure with hypoxia: Secondary | ICD-10-CM | POA: Diagnosis not present

## 2021-02-17 LAB — HEPATIC FUNCTION PANEL
ALT: 18 U/L (ref 0–35)
AST: 31 U/L (ref 0–37)
Albumin: 4.1 g/dL (ref 3.5–5.2)
Alkaline Phosphatase: 112 U/L (ref 39–117)
Bilirubin, Direct: 0.1 mg/dL (ref 0.0–0.3)
Total Bilirubin: 0.5 mg/dL (ref 0.2–1.2)
Total Protein: 6.8 g/dL (ref 6.0–8.3)

## 2021-02-17 NOTE — Telephone Encounter (Addendum)
Spoke with the pt and scheduled her to come into the office to see Dr. Burt Knack on next Thursday 8/18 at 2:40 pm.  Pt aware to arrive 15 mins prior to this appt.  Pt request if Dr. Burt Knack could place her daughter Beverlee Nims on speaker phone during this appt, since she will not be able to attend the appt with the pt.  Noted this in appt notes.  Pt verbalized understanding and agrees with this plan.  Pt was more than gracious for all the assistance provided.

## 2021-02-18 LAB — URINE CULTURE
MICRO NUMBER:: 12230747
SPECIMEN QUALITY:: ADEQUATE

## 2021-02-22 DIAGNOSIS — I083 Combined rheumatic disorders of mitral, aortic and tricuspid valves: Secondary | ICD-10-CM | POA: Diagnosis not present

## 2021-02-22 DIAGNOSIS — J9601 Acute respiratory failure with hypoxia: Secondary | ICD-10-CM | POA: Diagnosis not present

## 2021-02-22 DIAGNOSIS — I110 Hypertensive heart disease with heart failure: Secondary | ICD-10-CM | POA: Diagnosis not present

## 2021-02-22 DIAGNOSIS — I25118 Atherosclerotic heart disease of native coronary artery with other forms of angina pectoris: Secondary | ICD-10-CM | POA: Diagnosis not present

## 2021-02-22 DIAGNOSIS — N179 Acute kidney failure, unspecified: Secondary | ICD-10-CM | POA: Diagnosis not present

## 2021-02-22 DIAGNOSIS — I452 Bifascicular block: Secondary | ICD-10-CM | POA: Diagnosis not present

## 2021-02-22 DIAGNOSIS — I5033 Acute on chronic diastolic (congestive) heart failure: Secondary | ICD-10-CM | POA: Diagnosis not present

## 2021-02-22 DIAGNOSIS — E119 Type 2 diabetes mellitus without complications: Secondary | ICD-10-CM | POA: Diagnosis not present

## 2021-02-22 DIAGNOSIS — T8141XD Infection following a procedure, superficial incisional surgical site, subsequent encounter: Secondary | ICD-10-CM | POA: Diagnosis not present

## 2021-02-23 ENCOUNTER — Other Ambulatory Visit: Payer: Self-pay

## 2021-02-23 ENCOUNTER — Ambulatory Visit: Payer: Medicare HMO | Admitting: Cardiovascular Disease

## 2021-02-23 ENCOUNTER — Encounter: Payer: Self-pay | Admitting: Cardiovascular Disease

## 2021-02-23 VITALS — BP 162/60 | HR 80 | Ht 61.0 in | Wt 150.8 lb

## 2021-02-23 DIAGNOSIS — J9601 Acute respiratory failure with hypoxia: Secondary | ICD-10-CM | POA: Diagnosis not present

## 2021-02-23 DIAGNOSIS — T8141XD Infection following a procedure, superficial incisional surgical site, subsequent encounter: Secondary | ICD-10-CM | POA: Diagnosis not present

## 2021-02-23 DIAGNOSIS — I10 Essential (primary) hypertension: Secondary | ICD-10-CM | POA: Diagnosis not present

## 2021-02-23 DIAGNOSIS — I25118 Atherosclerotic heart disease of native coronary artery with other forms of angina pectoris: Secondary | ICD-10-CM | POA: Diagnosis not present

## 2021-02-23 DIAGNOSIS — I083 Combined rheumatic disorders of mitral, aortic and tricuspid valves: Secondary | ICD-10-CM | POA: Diagnosis not present

## 2021-02-23 DIAGNOSIS — I5032 Chronic diastolic (congestive) heart failure: Secondary | ICD-10-CM | POA: Diagnosis not present

## 2021-02-23 DIAGNOSIS — I452 Bifascicular block: Secondary | ICD-10-CM | POA: Diagnosis not present

## 2021-02-23 DIAGNOSIS — I5033 Acute on chronic diastolic (congestive) heart failure: Secondary | ICD-10-CM | POA: Diagnosis not present

## 2021-02-23 DIAGNOSIS — I2510 Atherosclerotic heart disease of native coronary artery without angina pectoris: Secondary | ICD-10-CM

## 2021-02-23 DIAGNOSIS — N179 Acute kidney failure, unspecified: Secondary | ICD-10-CM | POA: Diagnosis not present

## 2021-02-23 DIAGNOSIS — I110 Hypertensive heart disease with heart failure: Secondary | ICD-10-CM | POA: Diagnosis not present

## 2021-02-23 DIAGNOSIS — E119 Type 2 diabetes mellitus without complications: Secondary | ICD-10-CM | POA: Diagnosis not present

## 2021-02-23 MED ORDER — LOSARTAN POTASSIUM 50 MG PO TABS: 50.0000 mg | ORAL_TABLET | Freq: Every day | ORAL | 3 refills | Status: DC

## 2021-02-23 NOTE — Patient Instructions (Signed)
Medication Instructions:  Your physician has recommended you make the following change in your medication:  1-START Losartan 50 mg by mouth daily.  *If you need a refill on your cardiac medications before your next appointment, please call your pharmacy*  Lab Work: Your physician recommends that you return for lab work in:  2 weeks for BMET  If you have labs (blood work) drawn today and your tests are completely normal, you will receive your results only by: [facility] (if you have MyChart) OR A paper copy in the mail If you have any lab test that is abnormal or we need to change your treatment, we will call you to review the results.  Testing/Procedures: None ordered today.  Follow-Up: At [facility], you and your health needs are our priority.  As part of our continuing mission to provide you with exceptional heart care, we have created designated Provider Care Teams.  These Care Teams include your primary Cardiologist (physician) and Advanced Practice Providers (APPs -  Physician Assistants and Nurse Practitioners) who all work together to provide you with the care you need, when you need it.  We recommend signing up for the patient portal called "MyChart".  Sign up information is provided on this After Visit Summary.  MyChart is used to connect with patients for Virtual Visits (Telemedicine).  Patients are able to view lab/test results, encounter notes, upcoming appointments, etc.  Non-urgent messages can be sent to your provider as well.   To learn more about what you can do with MyChart, go to [email or web address].    Your next appointment:   4 month(s)  The format for your next appointment:   In Person  Provider:   You will see one of the following Advanced Practice Providers on your designated Care Team:   [name], PA-C Robbie Lis, PA-C  Then, [name], MD will plan to see you again in 6 month(s).   Other Instructions You have been referred to  Hypertension Clinic in 3 to 4 weeks.

## 2021-02-23 NOTE — Progress Notes (Signed)
Cardiology Office Note:    Date:  02/24/2021   ID:  Heather [name], DOB 27-Aug-1934, MRN [id number]  PCP:  Mosie Lukes, MD   [facility] Providers Cardiologist:  [name], MD Cardiology APP:  Sharmon Revere     Referring MD: Mosie Lukes, MD   Chief Complaint  Patient presents with   Shortness of Breath    History of Present Illness:    Heather [name] is a 86 y.o. female with a hx of: Coronary artery disease  Jaw pain=angingal equiv S/p Taxus DES to LCx 9/06 S/p Taxus DES to RCA 12/06 Residual dz 12/06: mLAD 70-80 Aortic atherosclerosis  Borderline diabetes mellitus  GERD  Hypertension  Hyperlipidemia  Hx of CVA Hx of post op DVT (after TKR) in 2016; Rivaroxaban x 6 mos Fatigue, palpitations 3/22 Echocardiogram 2/22: EF 55-60 Myoview 2/22: EF 67, low risk  Event monitor 3/22: no sig arrhythmia  Rx w/ beta-blocker   The patient is here with her daughter-in-law today. Her daughter is included in the visit over the telephone. The patient has struggled this year as she's been hospitalized 5 times with a variety of problems including cholecystitis, acute diastolic heart failure, and sepsis.  Several of her medicines have been adjusted during her hospitalizations.  Her beta-blocker was stopped because of bradycardia.  ARB and spironolactone were stopped because of acute kidney injury.  She continues to feel poorly.  She complains of a resting tremor and fatigue with weakness.  She has shortness of breath with exertion.  She has mild leg swelling.  No orthopnea or PND.  No recent chest pain or pressure.  Past Medical History:  Diagnosis Date   Chronic stable angina [facility])    Coronary artery disease cardiologist--- dr Burt Knack   hx NSTEMI  w/ cardiac cath 03-16-2005 PCI with DES to LCx;   cardiac cath 06-27-2005  PCI with DES to RCA with residual dx LAD manage medcially;  lexiscan 01-26-20211 normal no ischemia, ef 70%;  stress echo w/ dobutamine 10-24-2011  negative ishcmeia , normal ef // Myoview 2/22: EF 67, no ischemia or infarction, no TID, low risk    Diabetes mellitus type 2, diet-controlled ([place])    followed by pcp  (10-19-2020  pt stated checks daily in am,  fasting blood surgar--- 115--120s)   DOE (dyspnea on exertion)    per pt "when I over do",  ok with household chores   Echocardiogram 08/2020    Echocardiogram 2/22: EF 55-60, no RWMA, mild LVH, Gr 2 DD, GLS-21.7%, normal RVSF, trivial MR, RVSP 39.5   Edema of right lower extremity    GERD (gastroesophageal reflux disease)    Hiatal hernia    recurrence,  hx HH repair 1989   History of cervical cancer    s/p  vaginal hysterectomy   History of DVT of lower extremity 2016   11-29-2014 post op right TKA of right lower extremity and completed xarelto    History of esophageal stricture    hx s/p dilatation's   History of gastric ulcer 2005 approx.   History of palpitations 2010   event monitor 07-07-2009 showed NSR w/ freq. SVT ectopies with short runs, rare PVCs   History of TIA (transient ischemic attack) 06/1999   12-15-2019  per pt had several TIA between 12/ 2000 to 02/ 2001 , was sent to specialist '@[facility]' , had test that was normal (10-19-2020 pt stated no TIAs since ) but has residual of essential tremors of  right arm/ hand   Hypertension    Intermittent palpitations    IT band syndrome    Migraine    "ice pick headche lasts about 30 seconds"   Mixed hyperlipidemia    Mixed incontinence urge and stress    Multiple thyroid nodules    followed by pcp---   ultrasound 11-22-2014 no bx   (12-15-2019 per pt had a endocrinologist and was told did not need bx)   OA (osteoarthritis)    knees, elbow, hip, ankles   Occasional tremors    right arm/ hand  s/p TIA residual 2000   Osteoporosis    taking vitamin d   Peroneal DVT (deep venous thrombosis) ([place]) 12/22/2014   Right bundle branch block (RBBB) with left anterior fascicular block (LAFB)    RLS (restless legs syndrome)    S/P  drug eluting coronary stent placement 2006   03-16-2005  PCI x1 DES to LCx;   06-27-2005  PCI x1 DES to RCA   Urinary retention    post op sling prodecure on 10-27-2020, has foley cathether    Past Surgical History:  Procedure Laterality Date   ANTERIOR AND POSTERIOR REPAIR N/A 12/22/2019   Procedure: ANTERIOR (CYSTOCELE)  REPAIR;  Surgeon: Janyth Contes, MD;  Location: [place];  Service: Gynecology;  Laterality: N/A;   ANTERIOR AND POSTERIOR REPAIR WITH SACROSPINOUS FIXATION N/A 10/27/2020   Procedure: SACROSPINOUS LIGAMENT FIXATION;  Surgeon: Jaquita Folds, MD;  Location: [facility];  Service: Gynecology;  Laterality: N/A;   BLADDER SUSPENSION N/A 10/27/2020   Procedure: TRANSVAGINAL TAPE (TVT) PROCEDURE;  Surgeon: Jaquita Folds, MD;  Location: [facility];  Service: Gynecology;  Laterality: N/A;   CATARACT EXTRACTION W/ INTRAOCULAR LENS  IMPLANT, BILATERAL  2015   CHOLECYSTECTOMY N/A 01/25/2021   Procedure: LAPAROSCOPIC CHOLECYSTECTOMY WITH INTRAOPERATIVE CHOLANGIOGRAM AND LYSIS OF ADHESIONS;  Surgeon: Josey Forcier Boston, MD;  Location: [facility];  Service: General;  Laterality: N/A;   COLONOSCOPY  last one ?   CORONARY ANGIOPLASTY WITH STENT PLACEMENT  03-16-2005   dr Lia Foyer   PCI and DES x1 to LCx   CORONARY ANGIOPLASTY WITH STENT PLACEMENT  06-27-2005  dr Lia Foyer   PCI and DES x1 to RCA with residual disease LAD 70-80% to manage medically   CYSTOSCOPY N/A 10/27/2020   Procedure: CYSTOSCOPY;  Surgeon: Jaquita Folds, MD;  Location: [facility];  Service: Gynecology;  Laterality: N/A;   CYSTOSCOPY N/A 11/09/2020   Procedure: CYSTOSCOPY;  Surgeon: Jaquita Folds, MD;  Location: [facility];  Service: Gynecology;  Laterality: N/A;   FOOT SURGERY Left 1990s   left foot stress fracture repair, per pt no hardware   HIATAL HERNIA REPAIR  1989   INCISIONAL HERNIA REPAIR N/A 01/25/2021    Procedure: PRIMARY REPAIR OF INCISIONAL HERNIA;  Surgeon: Carmin Dibartolo Boston, MD;  Location: [facility];  Service: General;  Laterality: N/A;   KNEE ARTHROSCOPY Bilateral right ?/   left x2 , last one 09-12-2009 @ [place]   PUBOVAGINAL SLING N/A 11/09/2020   Procedure: [place];  Surgeon: Jaquita Folds, MD;  Location: [facility];  Service: Gynecology;  Laterality: N/A;   RECTOCELE REPAIR N/A 04/20/2020   Procedure: POSTERIOR REPAIR (RECTOCELE);  Surgeon: Janyth Contes, MD;  Location: [facility];  Service: Gynecology;  Laterality: N/A;   RECTOCELE REPAIR N/A 10/27/2020   Procedure: POSTERIOR REPAIR (RECTOCELE);  Surgeon: Jaquita Folds, MD;  Location: [place];  Service: Gynecology;  Laterality: N/A;  total time requested for all procedures is 2 hours   TOTAL KNEE ARTHROPLASTY  11/12/2011   Procedure: TOTAL KNEE ARTHROPLASTY;  Surgeon: Lorn Junes, MD;  Location: [place];  Service: Orthopedics;  Laterality: Left;  Dr Noemi Chapel wants 90 minutes for this case   TOTAL KNEE ARTHROPLASTY Right 11/29/2014   Procedure: RIGHT TOTAL KNEE ARTHROPLASTY;  Surgeon: Vickey Huger, MD;  Location: [place];  Service: Orthopedics;  Laterality: Right;   UPPER GASTROINTESTINAL ENDOSCOPY  last one 04-25-2017   with dilatation esophageal stricture and savary dilatation   VAGINAL HYSTERECTOMY  1988    no ovaries removed for bleeeding    Current Medications: Current Meds  Medication Sig   acetaminophen (TYLENOL) 500 MG tablet Take 1,000 mg by mouth every 6 (six) hours as needed for moderate pain.   amLODipine (NORVASC) 10 MG tablet TAKE 1 TABLET EVERY DAY   Ascorbic Acid (VITAMIN C) 500 MG CHEW Chew 500 mg by mouth daily.   aspirin EC 81 MG tablet Take 1 tablet (81 mg total) by mouth daily.   Blood Glucose Monitoring Suppl (TRUE METRIX AIR GLUCOSE METER) w/Device KIT USE TO CHECK BLOOD SUGAR ONCE DAILY AND AS NEEDED.  DX CODE E11.9    Cholecalciferol (VITAMIN D) 50 MCG (2000 UT) CAPS Take 2,000 Units by mouth daily.    diclofenac Sodium (VOLTAREN) 1 % GEL Apply 1 application topically 4 (four) times daily as needed (hip pain).   famotidine (PEPCID) 20 MG tablet Take 20 mg by mouth at bedtime as needed for heartburn or indigestion.   feeding supplement, GLUCERNA SHAKE, (GLUCERNA SHAKE) LIQD Take 237 mLs by mouth 2 (two) times daily between meals.   furosemide (LASIX) 20 MG tablet Take 1 tablet (20 mg total) by mouth daily as needed for edema (weight gain greater than 3 pounds in 1 day or 5 pounds in 1 week).   glucose blood (TRUE METRIX BLOOD GLUCOSE TEST) test strip USE TO CHECK BLOOD SUGAR ONCE A DAY OR AS NEEDED.  DX CODE: E11.9   isosorbide mononitrate (IMDUR) 60 MG 24 hr tablet Take 1.5 tablets (90 mg total) by mouth daily.   ketorolac (TORADOL) 10 MG tablet Take 1 tablet (10 mg total) by mouth every 6 (six) hours as needed.   losartan (COZAAR) 50 MG tablet Take 1 tablet (50 mg total) by mouth daily.   magnesium oxide (MAG-OX) 400 MG tablet Take 400 mg by mouth at bedtime.   nitroGLYCERIN (NITROSTAT) 0.4 MG SL tablet Place 1 tablet (0.4 mg total) under the tongue every 5 (five) minutes as needed for chest pain.   omeprazole (PRILOSEC) 20 MG capsule Take 1 capsule (20 mg total) by mouth in the morning.   ondansetron (ZOFRAN ODT) 4 MG disintegrating tablet Take 1 tablet (4 mg total) by mouth every 6 (six) hours as needed for nausea or vomiting.   potassium chloride SA (KLOR-CON) 20 MEQ tablet Take 0.5 tablets (10 mEq total) by mouth daily.   pramipexole (MIRAPEX) 0.25 MG tablet Take 1 tablet (0.25 mg total) by mouth 2 (two) times daily.   simvastatin (ZOCOR) 20 MG tablet TAKE 1 TABLET AT BEDTIME   TRUEplus Lancets 33G MISC USE TO CHECK BLOOD SUGAR ONCE DAILY AND AS NEEDED.  DX CODE: E11.9   Zinc 50 MG TABS Take 50 mg by mouth daily.     Allergies:   Baclofen, Nitrofurantoin, and Tramadol   Social History   Socioeconomic  History   Marital status: Widowed    Spouse name: Not on file   Number of children: 3   Years of education: 18   Highest education level: Not on file  Occupational History   Occupation: works partime in Office manager: RETIRED    Comment: retired  Tobacco Use   Smoking status: Never   Smokeless tobacco: Never  Vaping Use   Vaping Use: Never used  Substance and Sexual Activity   Alcohol use: No    Alcohol/week: 0.0 standard drinks   Drug use: Never   Sexual activity: Not Currently    Birth control/protection: Surgical    Comment: lives alone  Other Topics Concern   Not on file  Social History Narrative   Lives with husband, Caffeine use- Half Caffeine)- 2 cups daily.  3 children living, one passed away.   Education: HS. Business college.  Retired.    Social Determinants of Health   Financial Resource Strain: Low Risk    Difficulty of Paying Living Expenses: Not hard at all  Food Insecurity: Not on file  Transportation Needs: Not on file  Physical Activity: Inactive   Days of Exercise per Week: 0 days   Minutes of Exercise per Session: 0 min  Stress: Not on file  Social Connections: Not on file     Family History: The patient's family history includes Arthritis in an other family member; Cancer in her brother; Coronary artery disease in her mother; Depression in her brother; Diabetes in her brother and daughter; Heart disease in her mother; Hypertension in her daughter and another family member; Other in an other family member; Stroke in her brother and father; Thyroid disease in her daughter. There is no history of Breast cancer, Colon cancer, Anesthesia problems, Hypotension, Malignant hyperthermia, Pseudochol deficiency, Colon polyps, Esophageal cancer, Rectal cancer, or Stomach cancer.  ROS:   Please see the history of present illness.    All other systems reviewed and are negative.  EKGs/Labs/Other Studies Reviewed:    The following studies were reviewed  today: Echo 01/29/2021: IMPRESSIONS     1. Left ventricular ejection fraction, by estimation, is 60 to 65%. The  left ventricle has normal function. The left ventricle has no regional  wall motion abnormalities. Left ventricular diastolic parameters are  indeterminate.   2. Right ventricular systolic function is normal. The right ventricular  size is normal. There is normal pulmonary artery systolic pressure. The  estimated right ventricular systolic pressure is 62.8 mmHg.   3. The mitral valve is normal in structure. Trivial mitral valve  regurgitation. No evidence of mitral stenosis.   4. The aortic valve is tricuspid. Aortic valve regurgitation is not  visualized. Mild to moderate aortic valve sclerosis/calcification is  present, without any evidence of aortic stenosis.   5. The inferior vena cava is normal in size with greater than 50%  respiratory variability, suggesting right atrial pressure of 3 mmHg.   EKG:  EKG is ordered today.  The ekg ordered today demonstrates NSR 80 bpm, RBBB  Recent Labs: 08/19/2020: NT-Pro BNP 428 01/31/2021: TSH 2.166 02/01/2021: Magnesium 2.0 02/08/2021: B Natriuretic Peptide 428.0 02/16/2021: BUN 13; Creatinine, Ser 0.91; Hemoglobin 12.2; Platelets 223.0; Potassium 4.1; Sodium 133 02/17/2021: ALT 18  Recent Lipid Panel    Component Value Date/Time   CHOL 150 07/14/2020 0921   TRIG 117.0 07/14/2020 0921   HDL 62.10 07/14/2020 0921   CHOLHDL 2 07/14/2020 0921   VLDL 23.4 07/14/2020 0921   LDLCALC  65 07/14/2020 0921   LDLDIRECT 73.4 05/20/2014 1651     Risk Assessment/Calculations:           Physical Exam:    VS:  BP (!) 162/60   Pulse 80   Ht '5\' 1"'  (1.549 m)   Wt 150 lb 12.8 oz (68.4 kg)   LMP  (LMP Unknown)   SpO2 97%   BMI 28.49 kg/m     Wt Readings from Last 3 Encounters:  02/23/21 150 lb 12.8 oz (68.4 kg)  02/16/21 152 lb 12.8 oz (69.3 kg)  02/04/21 152 lb (68.9 kg)     GEN: Elderly woman in no acute distress HEENT:  Normal NECK: No JVD; No carotid bruits LYMPHATICS: No lymphadenopathy CARDIAC: RRR, no murmurs, rubs, gallops RESPIRATORY:  Clear to auscultation without rales, wheezing or rhonchi  ABDOMEN: Soft, non-tender, non-distended MUSCULOSKELETAL: 1+ right pretibial and trace left pretibial edema; No deformity  SKIN: Warm and dry NEUROLOGIC:  Alert and oriented x 3 PSYCHIATRIC:  Normal affect   ASSESSMENT:    1. Essential hypertension   2. Coronary artery disease involving native coronary artery of native heart without angina pectoris   3. Chronic diastolic heart failure (HCC)    PLAN:    In order of problems listed above:  Home blood pressures reviewed and are above goal.  I reviewed extensive records, lab reports, and home readings.  Recommend resume losartan at half the previous dose, start 50 mg daily.  Check metabolic panel in 2 weeks.  Follow-up in hypertension clinic.  Continue other medications without change. Appears stable on current medical therapy.  No changes are made today. Lengthy discussion with the patient and her family about this diagnosis.  An echocardiogram has previously demonstrated grade 2 diastolic dysfunction.  Reviewed that this is common in elderly patients, especially in the setting of hypertension and coronary artery disease.  The patient has never had clinical heart failure until recently when she required fluid resuscitation during one of her hospitalizations.  She then developed acute shortness of breath at home requiring rehospitalization and IV diuresis.  She seems to be euvolemic at present.  I suspect her leg swelling is related to amlodipine.  The patient's lung fields are clear and she has no JVD on exam.  Her daily weights are stable and in fact are down by a few pounds from baseline.  She has a sliding scale and will continue to use furosemide 20 mg only if she gains 3 pounds in 1 day or 5 pounds from baseline.  Medication Adjustments/Labs and Tests  Ordered: Current medicines are reviewed at length with the patient today.  Concerns regarding medicines are outlined above.  Orders Placed This Encounter  Procedures   Basic Metabolic Panel (BMET)   AMB Referral to [facility] Pharm[facility]   EKG 12-Lead   Meds ordered this encounter  Medications   losartan (COZAAR) 50 MG tablet    Sig: Take 1 tablet (50 mg total) by mouth daily.    Dispense:  90 tablet    Refill:  3    Patient Instructions  Medication Instructions:  Your physician has recommended you make the following change in your medication:  1-START Losartan 50 mg by mouth daily.  *If you need a refill on your cardiac medications before your next appointment, please call your pharmacy*  Lab Work: Your physician recommends that you return for lab work in:  2 weeks for BMET  If you have labs (blood work) drawn today and your tests  are completely normal, you will receive your results only by: MyChart Message (if you have MyChart) OR A paper copy in the mail If you have any lab test that is abnormal or we need to change your treatment, we will call you to review the results.  Testing/Procedures: None ordered today.  Follow-Up: At [facility], you and your health needs are our priority.  As part of our continuing mission to provide you with exceptional heart care, we have created designated Provider Care Teams.  These Care Teams include your primary Cardiologist (physician) and Advanced Practice Providers (APPs -  Physician Assistants and Nurse Practitioners) who all work together to provide you with the care you need, when you need it.  We recommend signing up for the patient portal called "MyChart".  Sign up information is provided on this After Visit Summary.  MyChart is used to connect with patients for Virtual Visits (Telemedicine).  Patients are able to view lab/test results, encounter notes, upcoming appointments, etc.  Non-urgent messages can be sent to your provider as  well.   To learn more about what you can do with MyChart, go to [email or web address].    Your next appointment:   4 month(s)  The format for your next appointment:   In Person  Provider:   You will see one of the following Advanced Practice Providers on your designated Care Team:   [name], PA-C Robbie Lis, PA-C  Then, [name], MD will plan to see you again in 6 month(s).   Other Instructions You have been referred to Hypertension Clinic in 3 to 4 weeks.      Signed, [name], MD  02/24/2021 5:12 PM    [place]

## 2021-02-24 ENCOUNTER — Encounter: Payer: Self-pay | Admitting: *Deleted

## 2021-02-24 ENCOUNTER — Encounter: Payer: Self-pay | Admitting: Cardiovascular Disease

## 2021-02-24 ENCOUNTER — Telehealth: Payer: Self-pay | Admitting: Family Medicine

## 2021-02-24 DIAGNOSIS — I452 Bifascicular block: Secondary | ICD-10-CM | POA: Diagnosis not present

## 2021-02-24 DIAGNOSIS — I5033 Acute on chronic diastolic (congestive) heart failure: Secondary | ICD-10-CM | POA: Diagnosis not present

## 2021-02-24 DIAGNOSIS — T8141XD Infection following a procedure, superficial incisional surgical site, subsequent encounter: Secondary | ICD-10-CM | POA: Diagnosis not present

## 2021-02-24 DIAGNOSIS — E119 Type 2 diabetes mellitus without complications: Secondary | ICD-10-CM | POA: Diagnosis not present

## 2021-02-24 DIAGNOSIS — J9601 Acute respiratory failure with hypoxia: Secondary | ICD-10-CM | POA: Diagnosis not present

## 2021-02-24 DIAGNOSIS — I083 Combined rheumatic disorders of mitral, aortic and tricuspid valves: Secondary | ICD-10-CM | POA: Diagnosis not present

## 2021-02-24 DIAGNOSIS — N179 Acute kidney failure, unspecified: Secondary | ICD-10-CM | POA: Diagnosis not present

## 2021-02-24 DIAGNOSIS — I110 Hypertensive heart disease with heart failure: Secondary | ICD-10-CM | POA: Diagnosis not present

## 2021-02-24 DIAGNOSIS — I25118 Atherosclerotic heart disease of native coronary artery with other forms of angina pectoris: Secondary | ICD-10-CM | POA: Diagnosis not present

## 2021-02-24 NOTE — Telephone Encounter (Signed)
Called left message to call back 

## 2021-02-24 NOTE — Telephone Encounter (Signed)
Caller/Agency: Heather [name]  Callback Number: [phone] Requesting OT/PT/Skilled Nursing/Social Work/Speech Therapy:   Blood sugar currently @ 297 Fasting this morning was 191.. (normally 111- 148) its  increase weakness shakeness increase edema in ankles in legs and feet ( with Compression stocking on) Weight steady   162/64 Blood pressure reading just started on Losartan..     She also would like an order for Uranalysis to recheck to see if her previous UTI is clear

## 2021-02-27 ENCOUNTER — Other Ambulatory Visit: Payer: Self-pay | Admitting: Family Medicine

## 2021-02-27 DIAGNOSIS — I10 Essential (primary) hypertension: Secondary | ICD-10-CM

## 2021-02-27 DIAGNOSIS — E1121 Type 2 diabetes mellitus with diabetic nephropathy: Secondary | ICD-10-CM

## 2021-02-27 DIAGNOSIS — R300 Dysuria: Secondary | ICD-10-CM

## 2021-02-27 DIAGNOSIS — R2681 Unsteadiness on feet: Secondary | ICD-10-CM

## 2021-02-27 DIAGNOSIS — R531 Weakness: Secondary | ICD-10-CM

## 2021-02-27 NOTE — Telephone Encounter (Signed)
Patient informed/scheduled lab appt for next week/put in the order. The patient verbalized understanding.

## 2021-02-27 NOTE — Telephone Encounter (Signed)
HH referral done

## 2021-02-28 ENCOUNTER — Other Ambulatory Visit: Payer: Medicare HMO

## 2021-03-01 ENCOUNTER — Ambulatory Visit (INDEPENDENT_AMBULATORY_CARE_PROVIDER_SITE_OTHER): Payer: Medicare HMO | Admitting: Obstetrics and Gynecology

## 2021-03-01 ENCOUNTER — Other Ambulatory Visit: Payer: Self-pay

## 2021-03-01 ENCOUNTER — Encounter: Payer: Self-pay | Admitting: Obstetrics and Gynecology

## 2021-03-01 VITALS — BP 180/70 | HR 105

## 2021-03-01 DIAGNOSIS — N3281 Overactive bladder: Secondary | ICD-10-CM

## 2021-03-01 DIAGNOSIS — I25118 Atherosclerotic heart disease of native coronary artery with other forms of angina pectoris: Secondary | ICD-10-CM | POA: Diagnosis not present

## 2021-03-01 DIAGNOSIS — E119 Type 2 diabetes mellitus without complications: Secondary | ICD-10-CM | POA: Diagnosis not present

## 2021-03-01 DIAGNOSIS — T8141XD Infection following a procedure, superficial incisional surgical site, subsequent encounter: Secondary | ICD-10-CM | POA: Diagnosis not present

## 2021-03-01 DIAGNOSIS — I5033 Acute on chronic diastolic (congestive) heart failure: Secondary | ICD-10-CM | POA: Diagnosis not present

## 2021-03-01 DIAGNOSIS — I083 Combined rheumatic disorders of mitral, aortic and tricuspid valves: Secondary | ICD-10-CM | POA: Diagnosis not present

## 2021-03-01 DIAGNOSIS — I110 Hypertensive heart disease with heart failure: Secondary | ICD-10-CM | POA: Diagnosis not present

## 2021-03-01 DIAGNOSIS — J9601 Acute respiratory failure with hypoxia: Secondary | ICD-10-CM | POA: Diagnosis not present

## 2021-03-01 DIAGNOSIS — I452 Bifascicular block: Secondary | ICD-10-CM | POA: Diagnosis not present

## 2021-03-01 DIAGNOSIS — N179 Acute kidney failure, unspecified: Secondary | ICD-10-CM | POA: Diagnosis not present

## 2021-03-01 NOTE — Progress Notes (Signed)
[facility]  PTNS VISIT  CC:  Overactive bladder  86 y.o. with refractory overactive bladder who presents for percutaneous tibial nerve stimulation. The patient presents for PTNS session # 1.   Procedure: The patient spontaneously voided prior to beginning the procedure. The patient was placed in the sitting position and the left lower extremity was prepped in the usual fashion. The PTNS needle was then inserted at a 60 degree angle, 5 cm cephalad and 2 cm posterior to the medial malleolus. The PTNS unit was then programmed and an optimal response was noted at a setting of 7 milliamps. The PTNS stimulation was then performed at this setting for 30 minutes without incident and the patient tolerated the procedure well. The needle was removed and hemostasis was noted.   The pt will return in 1 week for PTNS session # 2. Will complete a baseline bladder diary before next session.   Jaquita Folds, MD

## 2021-03-02 ENCOUNTER — Ambulatory Visit (INDEPENDENT_AMBULATORY_CARE_PROVIDER_SITE_OTHER): Payer: Medicare HMO

## 2021-03-02 ENCOUNTER — Telehealth: Payer: Self-pay

## 2021-03-02 VITALS — BP 159/71 | HR 97 | Wt 150.0 lb

## 2021-03-02 DIAGNOSIS — R350 Frequency of micturition: Secondary | ICD-10-CM | POA: Diagnosis not present

## 2021-03-02 DIAGNOSIS — I5033 Acute on chronic diastolic (congestive) heart failure: Secondary | ICD-10-CM | POA: Diagnosis not present

## 2021-03-02 DIAGNOSIS — T8141XD Infection following a procedure, superficial incisional surgical site, subsequent encounter: Secondary | ICD-10-CM | POA: Diagnosis not present

## 2021-03-02 DIAGNOSIS — N3941 Urge incontinence: Secondary | ICD-10-CM | POA: Diagnosis not present

## 2021-03-02 DIAGNOSIS — J9601 Acute respiratory failure with hypoxia: Secondary | ICD-10-CM | POA: Diagnosis not present

## 2021-03-02 DIAGNOSIS — E119 Type 2 diabetes mellitus without complications: Secondary | ICD-10-CM | POA: Diagnosis not present

## 2021-03-02 DIAGNOSIS — N179 Acute kidney failure, unspecified: Secondary | ICD-10-CM | POA: Diagnosis not present

## 2021-03-02 DIAGNOSIS — I25118 Atherosclerotic heart disease of native coronary artery with other forms of angina pectoris: Secondary | ICD-10-CM | POA: Diagnosis not present

## 2021-03-02 DIAGNOSIS — I110 Hypertensive heart disease with heart failure: Secondary | ICD-10-CM | POA: Diagnosis not present

## 2021-03-02 DIAGNOSIS — I452 Bifascicular block: Secondary | ICD-10-CM | POA: Diagnosis not present

## 2021-03-02 DIAGNOSIS — I083 Combined rheumatic disorders of mitral, aortic and tricuspid valves: Secondary | ICD-10-CM | POA: Diagnosis not present

## 2021-03-02 LAB — POCT URINALYSIS DIPSTICK
Appearance: ABNORMAL
Bilirubin, UA: NEGATIVE
Glucose, UA: NEGATIVE
Ketones, UA: NEGATIVE
Nitrite, UA: NEGATIVE
Protein, UA: NEGATIVE
Spec Grav, UA: <=1.005 — AB (ref 1.010–1.025)
Urobilinogen, UA: 0.2 E.U./dL
pH, UA: 5 (ref 5.0–8.0)

## 2021-03-02 NOTE — Patient Instructions (Signed)
Pt will be contacted when results are back

## 2021-03-02 NOTE — Telephone Encounter (Signed)
Heather [name] is a 86 y.o. female called in and said she has been looking at her paper work from the hospital and realized the UTI ishe had in the hospital was a "hard infection to get rid of". (Pt does not know the name of the infection) Pt said she would like for only use Dr Wannetta Sender to control her UTI's. Pt said she would like for Dr Wannetta Sender to look over her hospital notes and see is a daily antibiotic would help her. Pt said she feels like she has a UTI and would like to cone give a sample. Pt complains of burning and pain while urinating. Pt was placed on the schedule.

## 2021-03-02 NOTE — Progress Notes (Signed)
Heather [name] is a 86 y.o. female came in and dropped off a urine sample.  Pt complains of UTI sx (burning and pain while urinating) Pt will be contacted when the results return

## 2021-03-03 LAB — URINALYSIS
Bilirubin, UA: NEGATIVE
Glucose, UA: NEGATIVE
Ketones, UA: NEGATIVE
Nitrite, UA: NEGATIVE
Specific Gravity, UA: 1.007 (ref 1.005–1.030)
Urobilinogen, Ur: 0.2 mg/dL (ref 0.2–1.0)
pH, UA: 6.5 (ref 5.0–7.5)

## 2021-03-04 LAB — URINE CULTURE: Organism ID, Bacteria: NO GROWTH

## 2021-03-06 ENCOUNTER — Other Ambulatory Visit (INDEPENDENT_AMBULATORY_CARE_PROVIDER_SITE_OTHER): Payer: Medicare HMO

## 2021-03-06 ENCOUNTER — Other Ambulatory Visit: Payer: Self-pay

## 2021-03-06 DIAGNOSIS — R300 Dysuria: Secondary | ICD-10-CM | POA: Diagnosis not present

## 2021-03-06 DIAGNOSIS — I10 Essential (primary) hypertension: Secondary | ICD-10-CM | POA: Diagnosis not present

## 2021-03-06 LAB — COMPREHENSIVE METABOLIC PANEL
ALT: 21 U/L (ref 0–35)
AST: 29 U/L (ref 0–37)
Albumin: 4.2 g/dL (ref 3.5–5.2)
Alkaline Phosphatase: 91 U/L (ref 39–117)
BUN: 17 mg/dL (ref 6–23)
CO2: 26 mEq/L (ref 19–32)
Calcium: 9.8 mg/dL (ref 8.4–10.5)
Chloride: 101 mEq/L (ref 96–112)
Creatinine, Ser: 0.92 mg/dL (ref 0.40–1.20)
GFR: 57.22 mL/min — ABNORMAL LOW (ref >60.00)
Glucose, Bld: 118 mg/dL — ABNORMAL HIGH (ref 70–99)
Potassium: 4.5 mEq/L (ref 3.5–5.1)
Sodium: 137 mEq/L (ref 135–145)
Total Bilirubin: 0.5 mg/dL (ref 0.2–1.2)
Total Protein: 6.4 g/dL (ref 6.0–8.3)

## 2021-03-06 LAB — URINALYSIS, ROUTINE W REFLEX MICROSCOPIC
Hgb urine dipstick: NEGATIVE
Ketones, ur: NEGATIVE
Nitrite: NEGATIVE
RBC / HPF: NONE SEEN (ref 0–?)
Specific Gravity, Urine: 1.01 (ref 1.000–1.030)
Total Protein, Urine: NEGATIVE
Urine Glucose: NEGATIVE
Urobilinogen, UA: 0.2 (ref 0.0–1.0)
pH: 6.5 (ref 5.0–8.0)

## 2021-03-07 NOTE — Telephone Encounter (Signed)
Pt came in and gave a sample. Pt was notified of the results

## 2021-03-08 ENCOUNTER — Other Ambulatory Visit: Payer: Self-pay

## 2021-03-08 ENCOUNTER — Ambulatory Visit (INDEPENDENT_AMBULATORY_CARE_PROVIDER_SITE_OTHER): Payer: Medicare HMO | Admitting: Obstetrics and Gynecology

## 2021-03-08 VITALS — BP 159/71 | HR 97

## 2021-03-08 DIAGNOSIS — N3281 Overactive bladder: Secondary | ICD-10-CM | POA: Diagnosis not present

## 2021-03-08 DIAGNOSIS — B372 Candidiasis of skin and nail: Secondary | ICD-10-CM

## 2021-03-08 MED ORDER — NYSTATIN 100000 UNIT/GM EX POWD: 1.0000 "application " | Freq: Three times a day (TID) | CUTANEOUS | 0 refills | Status: DC

## 2021-03-08 NOTE — Progress Notes (Signed)
[facility]  PTNS VISIT  CC:  Overactive bladder  86 y.o. with refractory overactive bladder who presents for percutaneous tibial nerve stimulation. The patient presents for PTNS session # 2.  She also complains of a rash in vaginal/ groin area that is read and itchy.  She has completed a baseline bladder diary:          Procedure: The patient spontaneously voided prior to beginning the procedure. The patient was placed in the sitting position and the left lower extremity was prepped in the usual fashion. The PTNS needle was then inserted at a 60 degree angle, 5 cm cephalad and 2 cm posterior to the medial malleolus. The PTNS unit was then programmed and an optimal response was noted at a setting of 8 milliamps. The PTNS stimulation was then performed at this setting for 30 minutes without incident and the patient tolerated the procedure well. The needle was removed and hemostasis was noted.   - Also prescribed  The pt will return in 1 week for PTNS session # 3   Jaquita Folds, MD

## 2021-03-09 ENCOUNTER — Other Ambulatory Visit: Payer: Self-pay

## 2021-03-09 ENCOUNTER — Telehealth: Payer: Self-pay | Admitting: Family Medicine

## 2021-03-09 ENCOUNTER — Other Ambulatory Visit: Payer: Medicare HMO

## 2021-03-09 DIAGNOSIS — I25118 Atherosclerotic heart disease of native coronary artery with other forms of angina pectoris: Secondary | ICD-10-CM | POA: Diagnosis not present

## 2021-03-09 DIAGNOSIS — E119 Type 2 diabetes mellitus without complications: Secondary | ICD-10-CM | POA: Diagnosis not present

## 2021-03-09 DIAGNOSIS — T8141XD Infection following a procedure, superficial incisional surgical site, subsequent encounter: Secondary | ICD-10-CM | POA: Diagnosis not present

## 2021-03-09 DIAGNOSIS — I452 Bifascicular block: Secondary | ICD-10-CM | POA: Diagnosis not present

## 2021-03-09 DIAGNOSIS — I083 Combined rheumatic disorders of mitral, aortic and tricuspid valves: Secondary | ICD-10-CM | POA: Diagnosis not present

## 2021-03-09 DIAGNOSIS — J9601 Acute respiratory failure with hypoxia: Secondary | ICD-10-CM | POA: Diagnosis not present

## 2021-03-09 DIAGNOSIS — I10 Essential (primary) hypertension: Secondary | ICD-10-CM

## 2021-03-09 DIAGNOSIS — I5033 Acute on chronic diastolic (congestive) heart failure: Secondary | ICD-10-CM | POA: Diagnosis not present

## 2021-03-09 DIAGNOSIS — I110 Hypertensive heart disease with heart failure: Secondary | ICD-10-CM | POA: Diagnosis not present

## 2021-03-09 DIAGNOSIS — N179 Acute kidney failure, unspecified: Secondary | ICD-10-CM | POA: Diagnosis not present

## 2021-03-09 LAB — URINE CULTURE
MICRO NUMBER:: 12303432
SPECIMEN QUALITY:: ADEQUATE

## 2021-03-09 MED ORDER — CEFDINIR 300 MG PO CAPS: 300.0000 mg | ORAL_CAPSULE | Freq: Two times a day (BID) | ORAL | 0 refills | Status: DC

## 2021-03-09 NOTE — Telephone Encounter (Signed)
Culture still not back yet.

## 2021-03-09 NOTE — Telephone Encounter (Signed)
Glenard Haring, the RN from [facility] is calling in regards of the patient. She states that she was with the patient today and the patient says her pain from urination has gotten worse and more frequent. She was advised to call back and let [name] know that her symptoms have gotten worse. RN wants to know if an antibiotic is going to be sent soon since the urinalysis and urine culture are back. She also wants to know if an order for a 3-1 bedside commode cant be sent to the [facility] for the patient.RN can be reached at [phone]. Please advice.

## 2021-03-10 ENCOUNTER — Telehealth: Payer: Self-pay | Admitting: Family Medicine

## 2021-03-10 ENCOUNTER — Encounter: Payer: Self-pay | Admitting: Obstetrics and Gynecology

## 2021-03-10 ENCOUNTER — Other Ambulatory Visit: Payer: Medicare HMO

## 2021-03-10 LAB — BASIC METABOLIC PANEL
BUN/Creatinine Ratio: 27 (ref 12–28)
BUN: 22 mg/dL (ref 8–27)
CO2: 24 mmol/L (ref 20–29)
Calcium: 9.1 mg/dL (ref 8.7–10.3)
Chloride: 101 mmol/L (ref 96–106)
Creatinine, Ser: 0.83 mg/dL (ref 0.57–1.00)
Glucose: 177 mg/dL — ABNORMAL HIGH (ref 65–99)
Potassium: 4.2 mmol/L (ref 3.5–5.2)
Sodium: 137 mmol/L (ref 134–144)
eGFR: 69 mL/min/{1.73_m2} (ref >59)

## 2021-03-10 NOTE — Telephone Encounter (Signed)
Glenard Haring, the RN from [facility] called with patient's concerns regarding medication. Glenard Haring states that the patient is concern that the medication she was prescribed (Cefdinir '300MG'$  2 a day 5 days) is not going to fix her current issue since it has been prescribed to her 2 times before and the infection doesn't seem to go away. She is concerned the issue is going to get worse if we can't figure out how to fight it. Glenard Haring told pt she would call us and make [place] aware of pt's concerns . Please advise.

## 2021-03-10 NOTE — Progress Notes (Signed)
Done yesterday.

## 2021-03-13 ENCOUNTER — Other Ambulatory Visit: Payer: Self-pay | Admitting: Family Medicine

## 2021-03-13 DIAGNOSIS — G2581 Restless legs syndrome: Secondary | ICD-10-CM

## 2021-03-14 ENCOUNTER — Ambulatory Visit (INDEPENDENT_AMBULATORY_CARE_PROVIDER_SITE_OTHER): Payer: Medicare HMO | Admitting: Pharmacist

## 2021-03-14 ENCOUNTER — Other Ambulatory Visit: Payer: Self-pay | Admitting: Family Medicine

## 2021-03-14 DIAGNOSIS — T8141XD Infection following a procedure, superficial incisional surgical site, subsequent encounter: Secondary | ICD-10-CM | POA: Diagnosis not present

## 2021-03-14 DIAGNOSIS — I10 Essential (primary) hypertension: Secondary | ICD-10-CM

## 2021-03-14 DIAGNOSIS — J9601 Acute respiratory failure with hypoxia: Secondary | ICD-10-CM | POA: Diagnosis not present

## 2021-03-14 DIAGNOSIS — K219 Gastro-esophageal reflux disease without esophagitis: Secondary | ICD-10-CM

## 2021-03-14 DIAGNOSIS — I5032 Chronic diastolic (congestive) heart failure: Secondary | ICD-10-CM

## 2021-03-14 DIAGNOSIS — E1121 Type 2 diabetes mellitus with diabetic nephropathy: Secondary | ICD-10-CM

## 2021-03-14 DIAGNOSIS — E119 Type 2 diabetes mellitus without complications: Secondary | ICD-10-CM | POA: Diagnosis not present

## 2021-03-14 DIAGNOSIS — I5033 Acute on chronic diastolic (congestive) heart failure: Secondary | ICD-10-CM | POA: Diagnosis not present

## 2021-03-14 DIAGNOSIS — I25118 Atherosclerotic heart disease of native coronary artery with other forms of angina pectoris: Secondary | ICD-10-CM | POA: Diagnosis not present

## 2021-03-14 DIAGNOSIS — N179 Acute kidney failure, unspecified: Secondary | ICD-10-CM | POA: Diagnosis not present

## 2021-03-14 DIAGNOSIS — I083 Combined rheumatic disorders of mitral, aortic and tricuspid valves: Secondary | ICD-10-CM | POA: Diagnosis not present

## 2021-03-14 DIAGNOSIS — I2510 Atherosclerotic heart disease of native coronary artery without angina pectoris: Secondary | ICD-10-CM

## 2021-03-14 DIAGNOSIS — I452 Bifascicular block: Secondary | ICD-10-CM | POA: Diagnosis not present

## 2021-03-14 DIAGNOSIS — Z87440 Personal history of urinary (tract) infections: Secondary | ICD-10-CM

## 2021-03-14 DIAGNOSIS — I110 Hypertensive heart disease with heart failure: Secondary | ICD-10-CM | POA: Diagnosis not present

## 2021-03-14 DIAGNOSIS — R300 Dysuria: Secondary | ICD-10-CM

## 2021-03-14 NOTE — Telephone Encounter (Signed)
Spoke with pt and she is already seeing urology tomorrow. Pt is aware of below

## 2021-03-14 NOTE — Chronic Care Management (AMB) (Signed)
Chronic Care Management Pharmacy Note  03/14/2021 Name:  Heather [name] MRN:  [id number] DOB:  10-16-1934  Summary: Recurrent / frequent UTIs.  Did not start Myrbetriq - never received from mail order pharmacy. Patient suspects due to cost - 90 Days was $125.  Recommendations/Changes made from today's visit: Discuss treatment / prophylaxis with UTI with UroGyn at appt tomorrow. Also recommended request prescription for Myrbetriq for 30 days to local pharmacy.  Consider rechecking DEXA and treatment of low BMD due to high 78yrfracture risk (FRAX)    Subjective: Heather CARDIFFis an 86y.o. year old female who is a primary patient of BMosie Lukes MD.  The CCM team was consulted for assistance with disease management and care coordination needs.    Engaged with patient by telephone for follow up visit in response to provider referral for pharmacy case management and/or care coordination services.   Consent to Services:  The patient was given information about Chronic Care Management services, agreed to services, and gave verbal consent prior to initiation of services.  Please see initial visit note for detailed documentation.   Patient Care Team: BMosie Lukes MD as PCP - General (Family Medicine) CSherren Mocha MD as PCP - Cardiology (Cardiology) NIdelle Leech O[place](Optometry) BJanyth Contes MD as Consulting Physician (Obstetrics and Gynecology) WTrula Slade DPM as Consulting Physician (Podiatry) WSharmon Revereas Physician Assistant (Cardiology) ECherre Robins PharmD (Pharmacist) GMichael Boston MD as Consulting Physician (General Surgery) SJaquita Folds MD as Consulting Physician (Gynecology) Armbruster, SCarlota Raspberry MD as Consulting Physician (Gastroenterology)  Recent office visits: 02/16/2021 - PCP (Dr BCharlett Blake Video Visit for hospital follow up. C/o nausea and vomiting with dysphagia. Check for UTI. Started cefdinir 3026mbid. Referral to GI  for dysphagia.  01/16/2021 - PCP (SaManorPABessemer Bruising. Labs checked - CBC, CMP, A1c. No med changes.    Recent consult visits: 02/23/2021 - Cardio (Dr CoBurt KnackF/U CHF / SOB / HTN. Restarted losartan at lower dose of 5068maily (previously took 100m85mily)  02/03/2021 - Gen Surgery (Dr GosaLemar LoftyU surgery. Prescribed Bactrim DS bid for 7 days; hydrocodone/APAP 5/325mg64m as needed for pain up to 5 days and ondansetron 4mg q77mas needed for nausea.  01/23/2021 - Urogyn (dr SchroeWannetta Senderpost surgery  sling release and OAB. Started Myrbetriq 25mg d67m 12/01/2020 - GI (Zehr, PAC) upper right quadrant abdominal pain. CT showed cholethiasis. Referred back to [facility] for evaluation for surgery.  11/24/2020 - GYN (Dr Miller)[name] Heck upper quadrant pain with acute N/V. Urethral sling exam normal; Sent to ER. 11/08/20- URO GYN (Dr SchroedWannetta Sender revision/ release and cystoscopy 10/11/2020 - Uro-GYN (Dr SchroedWannetta Senderw urodynamic testing. Recommended sling treatment for stress incontinence and possibly PTNS after surgery if OAB still present after surgery 10/06/20- URO-GYN (Dr SchroedWannetta Sendernamic evaluation.  09/21/20- URO-GYN (Dr SchroedWannetta Senderal visit for prolapse and incontinence. Prescribed sulfamethoxazole -Trimethoprim DS 1 tablet bid  H[facility]: [facility]20[facility] New Medications: Augmentin 875mg tw36ma day for 3 days (total of 5 days) and oxycodone IR 5mg up t13mvery 6 hours as needed for pain.  Medications stopped: metoprolol 50mg, cef72mr 300mg, hydo14mone/ APAP and Bactrim DS.  01/28/2021 to 02[facility]02/2021 [facility] on chronic CHF.  Medications started: furosemide 20mg daily 71meeded for weight gain greater than 3 pounds in 1 day  or 5 pounds in 1 week; Cefdinir 366m bid for 5 days. Medications stopped: spironolactone and losartan 01/25/2021 General Surgery (Dr.  GJohney Maine  Cholecystectomy; repair of incarcerated incisionaly hernias. 11/24/2020 - ED - abdominal pain. Sent from GYN office.  CT scan.  No acute findings.  No changes in chronic underlying hernia findings and extensive gallstones in the gallbladder without cholecystitis. Lipase is marginally elevated. Recommended phenergan or ondansetron as needed for N/V and tramadol as needed for pain; gallbladder diet.        Objective:  Lab Results  Component Value Date   CREATININE 0.83 03/09/2021   CREATININE 0.92 03/06/2021   CREATININE 0.91 02/16/2021    Lab Results  Component Value Date   HGBA1C 7.9 (H) 01/17/2021   Last diabetic Eye exam:  Lab Results  Component Value Date/Time   HMDIABEYEEXA No Retinopathy 08/08/2020 12:00 AM    Last diabetic Foot exam: No results found for: HMDIABFOOTEX      Component Value Date/Time   CHOL 150 07/14/2020 0921   TRIG 117.0 07/14/2020 0921   HDL 62.10 07/14/2020 0921   CHOLHDL 2 07/14/2020 0921   VLDL 23.4 07/14/2020 0921   LDLCALC 65 07/14/2020 0921   LDLDIRECT 73.4 05/20/2014 1651    Hepatic Function Latest Ref Rng & Units 03/06/2021 02/17/2021 02/08/2021  Total Protein 6.0 - 8.3 g/dL 6.4 6.8 5.5(L)  Albumin 3.5 - 5.2 g/dL 4.2 4.1 2.6(L)  AST 0 - 37 U/L '29 31 27  ' ALT 0 - 35 U/L '21 18 18  ' Alk Phosphatase 39 - 117 U/L 91 112 114  Total Bilirubin 0.2 - 1.2 mg/dL 0.5 0.5 0.7  Bilirubin, Direct 0.0 - 0.3 mg/dL - 0.1 -    Lab Results  Component Value Date/Time   TSH 2.166 01/31/2021 05:01 AM   TSH 2.07 07/14/2020 09:21 AM   TSH 3.17 10/13/2019 09:09 AM    CBC Latest Ref Rng & Units 02/16/2021 02/10/2021 02/09/2021  WBC 4.0 - 10.5 K/uL 9.1 5.7 5.8  Hemoglobin 12.0 - 15.0 g/dL 12.2 10.4(L) 10.0(L)  Hematocrit 36.0 - 46.0 % 37.0 32.7(L) 31.0(L)  Platelets 150.0 - 400.0 K/uL 223.0 254 214    Lab Results  Component Value Date/Time   VD25OH 43.74 02/16/2021 02:03 PM    Clinical ASCVD: Yes  The ASCVD Risk score (Mikey BussingDC Jr., et al., 2013)  failed to calculate for the following reasons:   The 2013 ASCVD risk score is only valid for ages 427to 770   Other: DEXA 06/02/2018 Lowest T-Score = -1.6 at right femur neck; FRAX was 13.6% for major fracture and 3.6% for hip fracture  EF = 60-65% (2022)  Social History   Tobacco Use  Smoking Status Never  Smokeless Tobacco Never   BP Readings from Last 3 Encounters:  03/08/21 (!) 159/71  03/07/21 (!) 159/71  03/01/21 (!) 180/70   Pulse Readings from Last 3 Encounters:  03/08/21 97  03/07/21 97  03/01/21 (!) 105   Wt Readings from Last 3 Encounters:  03/07/21 150 lb (68 kg)  02/23/21 150 lb 12.8 oz (68.4 kg)  02/16/21 152 lb 12.8 oz (69.3 kg)    Assessment: Review of patient past medical history, allergies, medications, health status, including review of consultants reports, laboratory and other test data, was performed as part of comprehensive evaluation and provision of chronic care management services.   SDOH:  (Social Determinants of Health) assessments and interventions performed:  SDOH Interventions    Flowsheet Row Most Recent Value  SDOH Interventions   Food Insecurity Interventions Intervention Not Indicated  Transportation Interventions Intervention Not Indicated        CCM Care Plan  Allergies  Allergen Reactions   Baclofen Other (See Comments)    Hyperactivity    Nitrofurantoin Rash   Tramadol Other (See Comments)    Hallucinations     Medications Reviewed Today     Reviewed by [name], PharmD (Pharmacist) on 03/14/21 at 23  Med List Status: <None>   Medication Order Taking? Sig Documenting Provider Last Dose Status Informant  acetaminophen (TYLENOL) 500 MG tablet [id number] Yes Take 1,000 mg by mouth every 6 (six) hours as needed for moderate pain. [provider] Taking Active Child  amLODipine (NORVASC) 10 MG tablet [id number] Yes TAKE 1 TABLET EVERY DAY Mosie Lukes, MD Taking Active Child  Ascorbic Acid (VITAMIN C) 500  MG CHEW [id number] Yes Chew 500 mg by mouth daily. [provider] Taking Active Child  aspirin EC 81 MG tablet [id number] Yes Take 1 tablet (81 mg total) by mouth daily. [name], MD Taking Active Child  Blood Glucose Monitoring Suppl (TRUE METRIX AIR GLUCOSE METER) w/Device KIT [id number] Yes USE TO CHECK BLOOD SUGAR ONCE DAILY AND AS NEEDED.  DX CODE E11.9 Mosie Lukes, MD Taking Active Child  cefdinir (OMNICEF) 300 MG capsule [id number] Yes Take 1 capsule (300 mg total) by mouth 2 (two) times daily. Mosie Lukes, MD Taking Active   Cholecalciferol (VITAMIN D) 50 MCG (2000 UT) CAPS [id number] Yes Take 2,000 Units by mouth daily.  [provider] Taking Active Child  diclofenac Sodium (VOLTAREN) 1 % GEL [id number] Yes Apply 1 application topically 4 (four) times daily as needed (hip pain). [provider] Taking Active Child  famotidine (PEPCID) 20 MG tablet [id number] Yes Take 20 mg by mouth at bedtime as needed for heartburn or indigestion. [provider] Taking Active Child  furosemide (LASIX) 20 MG tablet [id number] Yes Take 1 tablet (20 mg total) by mouth daily as needed for edema (weight gain greater than 3 pounds in 1 day or 5 pounds in 1 week). [place] Donnamarie Poag, DO Taking Active Child  glucose blood (TRUE METRIX BLOOD GLUCOSE TEST) test strip [id number] Yes USE TO CHECK BLOOD SUGAR ONCE A DAY OR AS NEEDED.  DX CODE: E11.9 Mosie Lukes, MD Taking Active Child  isosorbide mononitrate (IMDUR) 60 MG 24 hr tablet [id number] Yes Take 1.5 tablets (90 mg total) by mouth daily. [name] T, PA-C Taking Active Child  ketorolac (TORADOL) 10 MG tablet [id number] No Take 1 tablet (10 mg total) by mouth every 6 (six) hours as needed.  Patient not taking: Reported on 03/14/2021   Jaquita Folds, MD Not Taking Active Child  losartan (COZAAR) 50 MG tablet [id number] Yes Take 1 tablet (50 mg total) by mouth daily. [name], MD Taking Active   magnesium  oxide (MAG-OX) 400 MG tablet [id number] Yes Take 400 mg by mouth at bedtime. [provider] Taking Active Child  mirabegron ER (MYRBETRIQ) 25 MG TB24 tablet [id number]  Take 1 tablet (25 mg total) by mouth daily. Jaquita Folds, MD  Active Child           Med Note Burt Knack, ERICIA A   Sun Feb 05, 2021 12:49 AM) As of 02/05/21 still not started   nitroGLYCERIN (NITROSTAT) 0.4 MG SL tablet [id number] Yes Place 1 tablet (0.4 mg total) under the tongue every 5 (five) minutes as needed for chest pain.  [name] T, PA-C Taking Active Child  nystatin (MYCOSTATIN/NYSTOP) powder [id number] Yes Apply 1 application topically 3 (three) times daily. Jaquita Folds, MD Taking Active   omeprazole (PRILOSEC) 20 MG capsule [id number] Yes Take 1 capsule (20 mg total) by mouth in the morning. Annita Brod, MD Taking Active   ondansetron [facility] ODT) 4 MG disintegrating tablet [id number] Yes Take 1 tablet (4 mg total) by mouth every 6 (six) hours as needed for nausea or vomiting. Yetta Flock, MD Taking Active Child  potassium chloride SA (KLOR-CON) 20 MEQ tablet [id number] Yes Take 0.5 tablets (10 mEq total) by mouth daily. [name] T, PA-C Taking Active Child  pramipexole (MIRAPEX) 0.25 MG tablet [id number] Yes Take 1 tablet (0.25 mg total) by mouth 2 (two) times daily. Mosie Lukes, MD Taking Active Child  simvastatin (ZOCOR) 20 MG tablet [id number] Yes TAKE 1 TABLET AT BEDTIME [name] T, PA-C Taking Active Child  TRUEplus Lancets 33G MISC [id number] Yes USE TO CHECK BLOOD SUGAR ONCE DAILY AND AS NEEDED.  DX CODE: E11.9 Mosie Lukes, MD Taking Active Child  Zinc 50 MG TABS [id number] Yes Take 50 mg by mouth daily. [provider] Taking Active Child            Patient Active Problem List   Diagnosis Date Noted   Dysphagia 02/16/2021   Tremor 02/16/2021   Sepsis ([place]) 02/05/2021   AKI (acute kidney injury) ([place]) 02/05/2021   Chronic diastolic CHF  (congestive heart failure) ([place]) 02/05/2021   UTI (urinary tract infection) 01/31/2021   Acute on chronic diastolic CHF (congestive heart failure) ([place]) 01/28/2021   Intra-abdominal fluid collection 01/28/2021   Incarcerated incisional hernia s/p primary repair 01/25/2021 01/25/2021   Stress reaction of bone 01/05/2021   Pain of right sternoclavicular joint 01/05/2021   Screening for osteoporosis 01/05/2021   RUQ pain 12/01/2020   Chronic calculous cholecystitis s/p lap cholecystectomy 01/25/2021 12/01/2020   Low back pain radiating to right leg 07/14/2020   Pelvic relaxation due to rectocele 04/06/2020   Prolapse of female pelvic organs 12/22/2019   Urinary incontinence, mixed 10/01/2019   Pelvic floor relaxation 06/08/2019   Deep venous thrombosis ([place]) 03/19/2019   Goiter 03/19/2019   Transient ischemic attack 03/19/2019   Acute dermatitis 03/19/2019   Pelvic prolapse 03/08/2019   Pain of breast 01/29/2019   Overactive bladder 01/29/2019   Educated about COVID-19 virus infection 12/02/2018   Hyperlipidemia associated with type 2 diabetes mellitus ([place]) 05/27/2017   Headache 04/17/2015   Multinodular goiter 01/17/2015   S/P total knee arthroplasty 11/29/2014   Insomnia 02/05/2014   Preventative health care 11/22/2013   RLS (restless legs syndrome) 10/04/2013   Lower urinary tract infectious disease 10/04/2013   Cataracts, bilateral 04/02/2013   Hypokalemia 01/05/2012   Anemia 01/05/2012   Mixed anxiety and depressive disorder 01/05/2012   Postoperative anemia due to acute blood loss 11/15/2011   Staphylococcus aureus carrier 11/15/2011   Pre-syncope 10/13/2011   DM (diabetes mellitus) ([place]) 10/06/2011   Pulmonary nodule 10/06/2011   Epigastric pain 10/06/2011   It band syndrome, right 08/28/2011   Renal insufficiency 08/28/2011   CORONARY ATHEROSCLEROSIS NATIVE CORONARY ARTERY 03/07/2010   Coronary atherosclerosis 01/18/2009   FATIGUE 01/18/2009   PERSISTENT DISORDER  INITIATING/MAINTAINING SLEEP 09/09/2008   DERMATITIS 10/16/2007   PERIPHERAL EDEMA 10/16/2007   Essential hypertension 04/14/2007   Gastroesophageal reflux disease with hiatal hernia 04/14/2007    Immunization History  Administered Date(s) Administered   Influenza Whole 06/08/2005,  06/29/2010   Influenza, High Dose Seasonal PF 04/05/2015, 06/18/2016, 05/27/2017, 05/30/2018   Influenza, Quadrivalent, Recombinant, Inj, Pf 04/22/2019   Influenza,inj,Quad PF,6+ Mos 04/30/2014   Influenza,inj,quad, With Preservative 05/06/2020   Moderna Sars-Covid-2 Vaccination 07/21/2019, 08/18/2019   PFIZER(Purple Top)SARS-COV-2 Vaccination 05/06/2020   Pneumococcal Conjugate-13 11/17/2013   Pneumococcal Polysaccharide-23 07/09/2000, 05/27/2017   Tdap 05/24/2015    Conditions to be addressed/monitored: CHF, CAD, HTN, HLD, DMII and GERD; restless leg syndrome; osteopenia; urinary incontinance; rectocele; prolapse;  Care Plan : General Pharmacy (Adult)  Updates made by [name], PHARMD since 03/14/2021 12:00 AM     Problem: Hypertension, Hyperlipidemia/CAD, Diabetes, GERD, RLS   Priority: High  Onset Date: 09/06/2020     Goal: Patient-Specific Goal   Start Date: 09/06/2020  Expected End Date: 03/09/2021  Recent Progress: On track  Priority: High  Note:   Current Barriers:  Unable to achieve control of blood pressure Maintain control of type 2 DM Recent acute exacerbation of CHF after surgery.  Frequent UTIs Difficulty obtaining Myrbetriq  Pharmacist Clinical Goal(s):  Over the next 180 days, patient will achieve adherence to monitoring guidelines and medication adherence to achieve therapeutic efficacy achieve control of blood pressure as evidenced by home monitoring Maintain control of type 2 DM as evidenced by A1c < 7.5% adhere to prescribed medication regimen as evidenced by fill dates Work with pharmacist and providers on improved access to medication therapy contact provider office  for questions/concerns as evidenced notation of same in electronic health record through collaboration with PharmD and provider.    Interventions: 1:1 collaboration with Mosie Lukes, MD regarding development and update of comprehensive plan of care as evidenced by provider attestation and co-signature Inter-disciplinary care team collaboration (see longitudinal plan of care) Comprehensive medication review performed; medication list updated in electronic medical record  Hypertension / CHF / Palpitations (BP goal <130/80) Improving; some BP reading still above goal.  EF was 60-65%  Monitored by cardiologist and will see them next week Current treatment: Amlodipine 23m daily Losartan 553mdaily Furosemide 2059maily as needed for edema / weight gain of more than 3 pounds in 1 day or 5 pounds in 1 week Potassium chloride 36m28m take 0.5 tablet = 10 mEq once a day.  Medications previously tried: triamterene-hctz (listed in D/C meds. No apparent reason for D/C); spironolactone (acute renal insufficiency); Losartan 100mg18mute renal insufficiency); metoprolol (low BP) Current home readings: 144/67 (prior to restarting losartan at 50mg 58my she reports home SBP was 150 - 170.  Checking weight daily - reports no more than 1 to 2 lbs change Diet: limiting serving sizes; no extra sodium Denies hypotensive/hypertensive symptoms Interventions:  Educated on BP goals and benefits of medications for prevention of heart attack, stroke and kidney damage; Counseled to monitor BP at home 1 to 2 times per week, document, and provide log at future appointments Monitor for edema - if continues to have edema might need to lower dose of amlodipine and restart metoprolol or increase losartan.   Hyperlipidemia/CAD: (LDL goal < 70) Controlled Current treatment: Simvastatin 36mg d36m at bedtime Aspirin 81mg Is28mbide ER 60mg dai34medications previously tried: none noted  Current exercise habits:  unable to exercise currently due to recent surgeries  Interventions:  Educated on Cholesterol goals;  Recommended to continue current medication  Diabetes (A1c goal <7.5) Uncontrolled; Last A1c has increased to 7.9%. Thought to be related to multiple hospitalizations / stress.  Current medications: diet Medications previously tried: metformin Current home glucose readings  Today was 135 after breakfast Usually  - 101, 113, 115, 95 Home health nurse check once and was 200. This was after a meal. Patient sometimes checks in afternoon and usually in 150's Denies hypoglycemic/hyperglycemic symptoms Current Diet:  Reports no intake of sugar drinks Limiting meal sizes but not following any particular dietary guidelines per patient Interventions:  Reviewed home blood glucose readings and reviewed goals  Fasting blood glucose goal (before meals) = 80 to 130 Blood glucose goal after a meal = less than 180  Continue to check blood glucose daily - varying between morning and evening  Reviewed foods to limit that are high in carbohydrate; provided handout about diabetic diet electronically Consider retrial of metformin ER 595m if next A1c > 7.5%  RLS (Goal: control symptoms) Controlled Current treatment  Pramipexole 0.273mtwice daily Medications previously tried: gabapentin Interventions - none addressed at previous visit.    Acid Reflux / GERD (goal: decrease acid reflux symptoms) Patient denies breakthrough reflux symptoms Has cholecystectomy 01/2021 Current regimen:  Famotidine 2010mnce a day at night if needed Omeprazole 1m41mce a day   Interventions: Discussed medications used to control acid reflux Goal is to use lowest dose needed to control symptoms Patient self care activities - Over the next 90 days, patient will: Continue current regimen   Osteopenia:  (goal: prevent fractures)  Last DEXA 06/02/2018 T-Score at right neck of femur = -1.6 T-Score at left forearm =  -0.6 FRAX estimate was 13.6% for major fracture and 3.6% for hip fracture FRAX estimate does place patient category to consider pharmacological intervention Current regimen:  Vitamin D 2000 IU daily    Interventions: Consider recheck DEXA If BMD is decreasing consider stating pharmacological therapy like oral bisphosphonate or Prolia.  Frequent UTIs / Overactive Bladder Uncontrolled Managed by Dr SchrWannetta Senderno-urology) Current regimen: none (was supposed to start Myrbetriq 25mg69mly but never started)  Per patient she thinks cost of Myrbetriq was >$100 and her mail order pharmacy will not fill until they get permission from her. She thinks it was just profiled.  Has had 3 rounds of antibiotics for UTIs in the last 6 months and hospitalization for sepsis post surgery.  She will see Dr SchroWannetta Senderrrow 03/15/21 Interventions:  Recommended she discuss frequent UTIs and treatment with Dr SchroWannetta Sender recommended she ask for 30 day supply of Myrbetriq be sent to local pharmacy (copay would be $45 instead of $125 for 90 DS) or ask Dr SchroRuby Colahe has samples to 2 to 4 week trial.   Medication management [facility] Interventions Comprehensive medication review performed. Continue current medication management strategy   Patient Goals/Self-Care Activities Over the next 180 days, patient will:  take medications as prescribed focus on medication adherence by pill count check blood pressure 1 to 2 times per week, document, and provide at future appointments Check BG twice per week  Follow Up Plan: The care management team will reach out to the patient again over the next 90 days.         Medication Assistance: None required.  Patient affirms current coverage meets needs.  Patient's preferred pharmacy is:  [id number] [address] [address]: [phone] [place] [address] [address] [address]: [phone] Up:  Patient  agrees to Care Plan and Follow-up.  Plan: The care management team will reach out to the patient again over the next 90  days.  [name], PharmD Clinical Pharmacist [place] [facility] [phone]

## 2021-03-14 NOTE — Patient Instructions (Signed)
Visit Information  PATIENT GOALS:  Goals Addressed             This Visit's Progress    Chronic Care Management Pharmacy Care Plan   On track    CARE PLAN ENTRY (see longitudinal plan of care for additional care plan information)  Current Barriers:  Chronic Disease Management support, education, and care coordination needs related to Hypertension, Hyperlipidemia/CAD, Diabetes, GERD, RLS   Hypertension / heart failure:  BP Readings from Last 3 Encounters:  03/08/21 (!) 159/71  03/07/21 (!) 159/71  03/01/21 (!) 180/70  Pharmacist Clinical Goal(s): Over the next 90 days, patient will work with PharmD and providers to achieve BP goal <140/90 Current regimen:  Amlodipine '10mg'$  daily  Losartan '50mg'$  daily Furosemide '20mg'$  daily if needed for swelling / weight gain of more than 3 pounds in 1 day or 5 pounds in 1 week. Potassium chloride 16mq - take 0.5 tablet = 10 mEq once a day.  Interventions: Requested patient to check blood pressure 1 to 2 times per week and record Discussed blood pressure goal Patient self care activities - Over the next 90 days, patient will: Check BP 1 to 2 times per week, document, and provide at future appointments Ensure daily salt intake < 2300 mg/day Continue current regimen for blood pressure and heart- has follow up with cardiologist next week.   Hyperlipidemia/CAD Lab Results  Component Value Date/Time   LDLCALC 65 07/14/2020 09:21 AM   LDLDIRECT 73.4 05/20/2014 04:51 PM  Pharmacist Clinical Goal(s): Over the next 90 days, patient will work with PharmD and providers to maintain LDL goal < 70 Current regimen:  Simvastatin '20mg'$  daily at bedtime Aspirin '81mg'$  once daily Interventions: Discussed LDL goal Patient self care activities - Over the next 90 days, patient will: Maintain cholesterol medication regimen.   Diabetes Lab Results  Component Value Date/Time   HGBA1C 7.9 (H) 01/17/2021 11:34 AM   HGBA1C 7.1 (H) 07/14/2020 09:21 AM   Pharmacist Clinical Goal(s): Over the next 90 days, patient will work with PharmD and providers to achieve A1c goal  <7.5% Current regimen:  Diet and exercise management   Interventions: Discussed diet and exercise; recommended continued avoidance of sugary beverages; limit serving sizes of high carbohydrate foods (bread, potatoes, rice and pasta)  Reviewed home blood glucose readings and reviewed goals  Fasting blood glucose goal (before meals) = 80 to 130 Blood glucose goal after a meal = less than 180  Patient self care activities - Over the next 90 days, patient will: Limit intake of food that can increase blood glucose Continue to check blood glucose daily, varying between morning and afternoon / night.   Frequent urinary tract infections / Overactive Bladder Current regimen: none (was supposed to start Myrbetriq '25mg'$  daily but never started)   She will see Dr SWannetta Sendertomorrow 03/15/21 Interventions:  Recommend discuss frequent urinary tract infections and treatment with Dr SWannetta SenderAsk for 30 day supply of Myrbetriq be sent to local pharmacy (copay would be $45 instead of $125 for 90 DS) or ask Dr SRuby Colaif she has samples to 2 to 4 week trial.   Acid Reflux / GERD:  Pharmacist Clinical Goal(s): Over the next 90 days, patient will work with PharmD and providers to decrease acid reflux symptoms Current regimen:  Famotidine '20mg'$  once a day at night if needed Omeprazole '20mg'$  once a day   Interventions: Discussed medications used to control acid reflux Goal is to use lowest dose needed to control symptoms Patient  self care activities - Over the next 90 days, patient will: Continue current regimen   Medication management Pharmacist Clinical Goal(s): Over the next 90 days, patient will work with PharmD and providers to maintain optimal medication adherence Current pharmacy: [facility] Order Interventions Comprehensive medication review performed. Continue current  medication management strategy Patient self care activities - Over the next 90 days, patient will: Focus on medication adherence by filling and taking medications appropriately  Take medications as prescribed Report any questions or concerns to PharmD and/or provider(s)  Please see past updates related to this goal by clicking on the "Past Updates" button in the selected goal          Patient verbalizes understanding of instructions provided today and agrees to view in [place].   Telephone follow up appointment with care management team member scheduled for: 70 days  [name], PharmD Clinical Pharmacist [facility]

## 2021-03-15 ENCOUNTER — Other Ambulatory Visit: Payer: Self-pay

## 2021-03-15 ENCOUNTER — Ambulatory Visit (INDEPENDENT_AMBULATORY_CARE_PROVIDER_SITE_OTHER): Payer: Medicare HMO | Admitting: Obstetrics and Gynecology

## 2021-03-15 VITALS — BP 186/69 | HR 81 | Wt 150.0 lb

## 2021-03-15 DIAGNOSIS — N3281 Overactive bladder: Secondary | ICD-10-CM | POA: Diagnosis not present

## 2021-03-15 DIAGNOSIS — N390 Urinary tract infection, site not specified: Secondary | ICD-10-CM | POA: Diagnosis not present

## 2021-03-15 MED ORDER — CEPHALEXIN 250 MG PO CAPS: 250.0000 mg | ORAL_CAPSULE | Freq: Every day | ORAL | 5 refills | Status: DC

## 2021-03-15 NOTE — Progress Notes (Signed)
[facility]   SUBJECTIVE  History of Present Illness: Heather [name] is a 86 y.o. female presenting today to discuss recurrent urinary tract infection and for scheduled PTNS.   Last urine culture performed in our office showed no growth. But shecontinued to have symptoms and had urine culture performed with her PCP which showed 50-100,000 E. Coli and 10- 49,000 Klebsiella. Took a course of cefdinir and just finished. She is concerned because the klebsiella seems to be persistent in causing infections.    Past Medical History: Patient  has a past medical history of Chronic stable angina ([place]), Coronary artery disease (cardiologist--- dr Burt Knack), Diabetes mellitus type 2, diet-controlled ([place]), DOE (dyspnea on exertion), Echocardiogram 08/2020, Edema of right lower extremity, GERD (gastroesophageal reflux disease), Hiatal hernia, History of cervical cancer, History of DVT of lower extremity (2016), History of esophageal stricture, History of gastric ulcer (2005 approx.), History of palpitations (2010), History of TIA (transient ischemic attack) (06/1999), Hypertension, Intermittent palpitations, IT band syndrome, Migraine, Mixed hyperlipidemia, Mixed incontinence urge and stress, Multiple thyroid nodules, OA (osteoarthritis), Occasional tremors, Osteoporosis, Peroneal DVT (deep venous thrombosis) ([place]) (12/22/2014), Right bundle branch block (RBBB) with left anterior fascicular block (LAFB), RLS (restless legs syndrome), S/P drug eluting coronary stent placement (2006), and Urinary retention.   Past Surgical History: She  has a past surgical history that includes Knee arthroscopy (Bilateral, right ?/   left x2 , last one 09-12-2009 @ [facility]); Foot surgery (Left, 1990s); Upper gastrointestinal endoscopy (last one 04-25-2017); Total knee arthroplasty (11/12/2011); Total knee arthroplasty (Right, 11/29/2014); Colonoscopy (last one ?); Cataract extraction w/ intraocular lens  implant, bilateral  (2015); Hiatal hernia repair (1989); Anterior and posterior repair (N/A, 12/22/2019); Vaginal hysterectomy (1988); Coronary angioplasty with stent (03-16-2005   dr Lia Foyer); Coronary angioplasty with stent (06-27-2005  dr Lia Foyer); Rectocele repair (N/A, 04/20/2020); Rectocele repair (N/A, 10/27/2020); Anterior and posterior repair with sacrospinous fixation (N/A, 10/27/2020); Bladder suspension (N/A, 10/27/2020); Cystoscopy (N/A, 10/27/2020); Pubovaginal sling (N/A, 11/09/2020); Cystoscopy (N/A, 11/09/2020); Cholecystectomy (N/A, 01/25/2021); and Incisional hernia repair (N/A, 01/25/2021).   Medications: She has a current medication list which includes the following prescription(s): acetaminophen, amlodipine, vitamin c, aspirin ec, true metrix air glucose meter, cephalexin, vitamin d, diclofenac sodium, famotidine, furosemide, true metrix blood glucose test, isosorbide mononitrate, ketorolac, losartan, magnesium oxide, nitroglycerin, nystatin, omeprazole, ondansetron, potassium chloride sa, pramipexole, simvastatin, trueplus lancets 33g, and zinc.   Allergies: Patient is allergic to baclofen, nitrofurantoin, and tramadol.   Social History: Patient  reports that she has never smoked. She has never used smokeless tobacco. She reports that she does not drink alcohol and does not use drugs.      OBJECTIVE     Physical Exam: Vitals:   03/15/21 1139  BP: (!) 186/69  Pulse: 81  Weight: 150 lb (68 kg)   Gen: No apparent distress, A&O x 3.  Detailed Urogynecologic Evaluation:  Deferred.    ASSESSMENT AND PLAN    [name] is a 86 y.o. with:  1. Recurrent UTI   - We discussed that she may be colonized with the klebsiella as it showed a lower level of bacteria. But she continues to have symptoms.  - Will have her start prophylactic antibiotics- Keflex '250mg'$  once daily. Can also consider referral to infectious disease for their input- will plan for this if she continues to have infections on the  antibiotic.    PTNS VISIT  CC:  Overactive bladder  86 y.o. with refractory overactive bladder who presents for percutaneous tibial  nerve stimulation. The patient presents for PTNS session # 3  She also complains of a rash in vaginal/ groin area that is read and itchy.  She has completed a baseline bladder diary:    Procedure: The patient spontaneously voided prior to beginning the procedure. The patient was placed in the sitting position and the left lower extremity was prepped in the usual fashion. The PTNS needle was then inserted at a 60 degree angle, 5 cm cephalad and 2 cm posterior to the medial malleolus. The PTNS unit was then programmed and an optimal response was noted at a setting of 4 milliamps. The PTNS stimulation was then performed at this setting for 30 minutes without incident and the patient tolerated the procedure well. The needle was removed and hemostasis was noted.   - Also prescribed  The pt will return in 1 week for PTNS session # 4   Jaquita Folds, MD  Time spent: I spent 15 minutes dedicated to the care of this patient on the date of this encounter to include pre-visit review of records, face-to-face time with the patient and post visit documentation and ordering medication/ testing. Additional time was spent for the procedure.

## 2021-03-16 DIAGNOSIS — I5033 Acute on chronic diastolic (congestive) heart failure: Secondary | ICD-10-CM | POA: Diagnosis not present

## 2021-03-16 DIAGNOSIS — I452 Bifascicular block: Secondary | ICD-10-CM | POA: Diagnosis not present

## 2021-03-16 DIAGNOSIS — E119 Type 2 diabetes mellitus without complications: Secondary | ICD-10-CM | POA: Diagnosis not present

## 2021-03-16 DIAGNOSIS — N179 Acute kidney failure, unspecified: Secondary | ICD-10-CM | POA: Diagnosis not present

## 2021-03-16 DIAGNOSIS — T8141XD Infection following a procedure, superficial incisional surgical site, subsequent encounter: Secondary | ICD-10-CM | POA: Diagnosis not present

## 2021-03-16 DIAGNOSIS — I083 Combined rheumatic disorders of mitral, aortic and tricuspid valves: Secondary | ICD-10-CM | POA: Diagnosis not present

## 2021-03-16 DIAGNOSIS — J9601 Acute respiratory failure with hypoxia: Secondary | ICD-10-CM | POA: Diagnosis not present

## 2021-03-16 DIAGNOSIS — I110 Hypertensive heart disease with heart failure: Secondary | ICD-10-CM | POA: Diagnosis not present

## 2021-03-16 DIAGNOSIS — I25118 Atherosclerotic heart disease of native coronary artery with other forms of angina pectoris: Secondary | ICD-10-CM | POA: Diagnosis not present

## 2021-03-21 ENCOUNTER — Telehealth: Payer: Self-pay | Admitting: Gastroenterology

## 2021-03-21 ENCOUNTER — Other Ambulatory Visit: Payer: Self-pay

## 2021-03-21 ENCOUNTER — Telehealth: Payer: Self-pay

## 2021-03-21 ENCOUNTER — Ambulatory Visit (INDEPENDENT_AMBULATORY_CARE_PROVIDER_SITE_OTHER): Payer: Medicare HMO | Admitting: Pharmacist

## 2021-03-21 VITALS — BP 150/70 | HR 85

## 2021-03-21 DIAGNOSIS — I452 Bifascicular block: Secondary | ICD-10-CM | POA: Diagnosis not present

## 2021-03-21 DIAGNOSIS — E119 Type 2 diabetes mellitus without complications: Secondary | ICD-10-CM | POA: Diagnosis not present

## 2021-03-21 DIAGNOSIS — J9601 Acute respiratory failure with hypoxia: Secondary | ICD-10-CM | POA: Diagnosis not present

## 2021-03-21 DIAGNOSIS — I110 Hypertensive heart disease with heart failure: Secondary | ICD-10-CM | POA: Diagnosis not present

## 2021-03-21 DIAGNOSIS — I083 Combined rheumatic disorders of mitral, aortic and tricuspid valves: Secondary | ICD-10-CM | POA: Diagnosis not present

## 2021-03-21 DIAGNOSIS — T8141XD Infection following a procedure, superficial incisional surgical site, subsequent encounter: Secondary | ICD-10-CM | POA: Diagnosis not present

## 2021-03-21 DIAGNOSIS — I10 Essential (primary) hypertension: Secondary | ICD-10-CM

## 2021-03-21 DIAGNOSIS — I25118 Atherosclerotic heart disease of native coronary artery with other forms of angina pectoris: Secondary | ICD-10-CM | POA: Diagnosis not present

## 2021-03-21 DIAGNOSIS — N179 Acute kidney failure, unspecified: Secondary | ICD-10-CM | POA: Diagnosis not present

## 2021-03-21 DIAGNOSIS — I5033 Acute on chronic diastolic (congestive) heart failure: Secondary | ICD-10-CM | POA: Diagnosis not present

## 2021-03-21 MED ORDER — LOSARTAN POTASSIUM 100 MG PO TABS: 100.0000 mg | ORAL_TABLET | Freq: Every day | ORAL | 3 refills | Status: DC

## 2021-03-21 NOTE — Telephone Encounter (Signed)
Call was placed to [facility] and phone went to voicemail is full can not leave a message. Will wait for further communication from [facility]

## 2021-03-21 NOTE — Progress Notes (Signed)
Patient ID: Heather [name]                 DOB: July 18, 1934                      MRN: [id number]     HPI: [name] is a 86 y.o. female referred by Dr. Burt Knack to HTN clinic. PMH is significant for CAD s/p DES to LCx and RCA in 2006, CVA, DM, HTN, HLD, GERD, post-op DVT after TKR in 2016, and palpitations. Has been hospitalized multiple times recently with a variety of problems including cholecystitis, acute diastolic CHF, and sepsis. Several meds were adjusted - her beta blocker was stopped secondary to bradycardia, ARB and spironolactone were stopped due to AKI. Reported resting tremor, fatigue and SOB with exertion at her last visit with Dr Burt Knack on 8/18. BP was elevated at 162/60 and her losartan was resumed at 45m daily with stable f/u BMET. She presents today for follow up.  Pt reports tolerating losartan well. Denies dizziness, headache, balance issues, headache and blurred vision. Previously took 1039mdaily in the past. Has an RN who comes to her home a few days a week, reports BP this AM was 138/70. Generally has been running 130s/60-70s, improved from 15448-185ystolic pre-losartan resumption. She has a separate wrist BP cuff but hasn't been using it much. Has been taking Lasix daily for the past few weeks which has controlled swelling in her legs better. Previously was just taking prn and rarely at that. Has been noticing more fluttering/palpitations since her metoprolol was stopped, previously took Toprol 7564maily.  Current HTN meds: amlodipine 26m66mily (AM), losartan 50mg41mly (AM), Imdur 90mg 69my (AM), furosemide 20mg p11mBP goal: <130/80mmHg 43mily History: Arthritis in an other family member; Cancer in her brother; Coronary artery disease in her mother; Depression in her brother; Diabetes in her brother and daughter; Heart disease in her mother; Hypertension in her daughter and another family member; Other in an other family member; Stroke in her brother and father; Thyroid  disease in her daughter  Diet: AM - 2 cups of 1/2 caff coffee, cereal with fruit., sometimes an egg and toast. Lunch - PB crackers. Dinner - salad, chicken  Wt Readings from Last 3 Encounters:  03/15/21 150 lb (68 kg)  03/07/21 150 lb (68 kg)  02/23/21 150 lb 12.8 oz (68.4 kg)   BP Readings from Last 3 Encounters:  03/15/21 (!) 186/69  03/08/21 (!) 159/71  03/07/21 (!) 159/71   Pulse Readings from Last 3 Encounters:  03/15/21 81  03/08/21 97  03/07/21 97    Renal function: Estimated Creatinine Clearance: 44.5 mL/min (by C-G formula based on SCr of 0.83 mg/dL).  Past Medical History:  Diagnosis Date   Chronic stable angina (HCC)   [facility]  Burt KnackTEMI  w/ cardiac cath 03-16-2005 PCI with DES to LCx;   cardiac cath 06-27-2005  PCI with DES to RCA with residual dx LAD manage medcially;  lexiscan 01-26-20211 normal no ischemia, ef 70%;  stress echo w/ dobutamine 10-24-2011 negative ishcmeia , normal ef // Myoview 2/22: EF 67, no ischemia or infarction, no TID, low risk    Diabetes mellitus type 2, diet-controlled (HCC)    [place]  ([place]2020  pt stated checks daily in am,  fasting blood surgar--- 115--120s)   DOE (dyspnea on exertion)    per pt "when I over do",  ok with household chores   Echocardiogram 08/2020    Echocardiogram 2/22: EF 55-60, no RWMA, mild LVH, Gr 2 DD, GLS-21.7%, normal RVSF, trivial MR, RVSP 39.5   Edema of right lower extremity    GERD (gastroesophageal reflux disease)    Hiatal hernia    recurrence,  hx HH repair 1989   History of cervical cancer    s/p  vaginal hysterectomy   History of DVT of lower extremity 2016   11-29-2014 post op right TKA of right lower extremity and completed xarelto    History of esophageal stricture    hx s/p dilatation's   History of gastric ulcer 2005 approx.   History of palpitations 2010   event monitor 07-07-2009 showed NSR w/ freq. SVT ectopies with short runs, rare  PVCs   History of TIA (transient ischemic attack) 06/1999   12-15-2019  per pt had several TIA between 12/ 2000 to 02/ 2001 , was sent to specialist '@[facility] , had test that was normal (10-19-2020 pt stated no TIAs since ) but has residual of essential tremors of right arm/ hand   Hypertension    Intermittent palpitations    IT band syndrome    Migraine    "ice pick headche lasts about 30 seconds"   Mixed hyperlipidemia    Mixed incontinence urge and stress    Multiple thyroid nodules    followed by pcp---   ultrasound 11-22-2014 no bx   (12-15-2019 per pt had a endocrinologist and was told did not need bx)   OA (osteoarthritis)    knees, elbow, hip, ankles   Occasional tremors    right arm/ hand  s/p TIA residual 2000   Osteoporosis    taking vitamin d   Peroneal DVT (deep venous thrombosis) ([place]) 12/22/2014   Right bundle branch block (RBBB) with left anterior fascicular block (LAFB)    RLS (restless legs syndrome)    S/P drug eluting coronary stent placement 2006   03-16-2005  PCI x1 DES to LCx;   06-27-2005  PCI x1 DES to RCA   Urinary retention    post op sling prodecure on 10-27-2020, has foley cathether    Current Outpatient Medications on File Prior to Visit  Medication Sig Dispense Refill   acetaminophen (TYLENOL) 500 MG tablet Take 1,000 mg by mouth every 6 (six) hours as needed for moderate pain.     amLODipine (NORVASC) 10 MG tablet TAKE 1 TABLET EVERY DAY 90 tablet 1   Ascorbic Acid (VITAMIN C) 500 MG CHEW Chew 500 mg by mouth daily.     aspirin EC 81 MG tablet Take 1 tablet (81 mg total) by mouth daily.     Blood Glucose Monitoring Suppl (TRUE METRIX AIR GLUCOSE METER) w/Device KIT USE TO CHECK BLOOD SUGAR ONCE DAILY AND AS NEEDED.  DX CODE E11.9 1 kit 0   cephALEXin (KEFLEX) 250 MG capsule Take 1 capsule (250 mg total) by mouth daily. 30 capsule 5   Cholecalciferol (VITAMIN D) 50 MCG (2000 UT) CAPS Take 2,000 Units by mouth daily.      diclofenac Sodium (VOLTAREN) 1 %  GEL Apply 1 application topically 4 (four) times daily as needed (hip pain).     famotidine (PEPCID) 20 MG tablet Take 20 mg by mouth at bedtime as needed for heartburn or indigestion.     furosemide (LASIX) 20 MG tablet Take 1 tablet (20 mg total) by mouth daily as needed for edema (weight gain greater than 3 pounds in 1  day or 5 pounds in 1 week). 30 tablet 2   glucose blood (TRUE METRIX BLOOD GLUCOSE TEST) test strip USE TO CHECK BLOOD SUGAR ONCE A DAY OR AS NEEDED.  DX CODE: E11.9 200 each 1   isosorbide mononitrate (IMDUR) 60 MG 24 hr tablet Take 1.5 tablets (90 mg total) by mouth daily. 135 tablet 3   ketorolac (TORADOL) 10 MG tablet Take 1 tablet (10 mg total) by mouth every 6 (six) hours as needed. 20 tablet 0   losartan (COZAAR) 50 MG tablet Take 1 tablet (50 mg total) by mouth daily. 90 tablet 3   magnesium oxide (MAG-OX) 400 MG tablet Take 400 mg by mouth at bedtime.     nitroGLYCERIN (NITROSTAT) 0.4 MG SL tablet Place 1 tablet (0.4 mg total) under the tongue every 5 (five) minutes as needed for chest pain. 25 tablet 11   nystatin (MYCOSTATIN/NYSTOP) powder Apply 1 application topically 3 (three) times daily. 60 g 0   omeprazole (PRILOSEC) 20 MG capsule Take 1 capsule (20 mg total) by mouth in the morning.     ondansetron (ZOFRAN ODT) 4 MG disintegrating tablet Take 1 tablet (4 mg total) by mouth every 6 (six) hours as needed for nausea or vomiting. 30 tablet 0   potassium chloride SA (KLOR-CON) 20 MEQ tablet Take 0.5 tablets (10 mEq total) by mouth daily. 45 tablet 2   pramipexole (MIRAPEX) 0.25 MG tablet TAKE 1 TABLET TWICE DAILY 180 tablet 1   simvastatin (ZOCOR) 20 MG tablet TAKE 1 TABLET AT BEDTIME 90 tablet 2   TRUEplus Lancets 33G MISC USE TO CHECK BLOOD SUGAR ONCE DAILY AND AS NEEDED.  DX CODE: E11.9 200 each 1   Zinc 50 MG TABS Take 50 mg by mouth daily.     No current facility-administered medications on file prior to visit.    Allergies  Allergen Reactions   Baclofen  Other (See Comments)    Hyperactivity    Nitrofurantoin Rash   Tramadol Other (See Comments)    Hallucinations      Assessment/Plan:  1. Hypertension - BP has improved but remains above goal < 130/78mHg. Home readings much better in the 1161Wsystolic compared to higher clinic reading today. Will increase losartan back to previous dose of 1028mdaily. Will also restart lower dose of Toprol 2518maily (previous maintenance dose 93m24mily) as pt has reported increase in palpitations since her beta blocker was stopped inpatient. Continue other meds. Will follow up in 3 weeks for BP check and BMET.  Danielys Madry E. Judeth Gilles, PharmD, BCACP, CPP [id number] [place] [id number]: [phone]: [phone]/2022 12:25 PM

## 2021-03-21 NOTE — Telephone Encounter (Signed)
Glenard Haring, RN with [facility] saw pt today and stated pt had an episode of nausea on Saturday and then started vomiting mucous Saturday night.  She called EMS and one EMS worker came on the call and stayed with the pt and administered Zofran to her and stayed with pt 30 mins to an hour.  Her BP was 170/ "something" and it went down with the zofran.  Pt vomited again what looked like dark coffee grounds in color and treated herself with zofran she had on hand.  Sunday she felt an increase in weakness.  She felt ok yesterday and today, but is experiencing a sharp pain in abdomen/pelvic area.  Glenard Haring checked that area out and stated no visual swelling, but pt had pain to the touch.  Pt is having regular bowel movements.  Pt is scheduled for a regular GI f/up on 10/11.  Glenard Haring advised pt if she has anymore episodes like this to go to the ER.  [facility] CB # is [phone].

## 2021-03-21 NOTE — Telephone Encounter (Signed)
[place] nurse would like to speak with you about some clinical information that she wants Dr. Havery [name] to be aware of. Pls call her at [phone].

## 2021-03-21 NOTE — Patient Instructions (Addendum)
Your blood pressure goal is < 130/25mHg  Increase your losartan back to '100mg'$  (1 full tablet) once daily  Restart metoprolol at a lower dose of '25mg'$  (1/2 of your '50mg'$  tablet) once daily  Continue taking your other medications  Monitor your blood pressure and heart rate at home  Follow up in 3 weeks for lab work and blood pressure check

## 2021-03-22 ENCOUNTER — Ambulatory Visit (INDEPENDENT_AMBULATORY_CARE_PROVIDER_SITE_OTHER): Payer: Medicare HMO | Admitting: Obstetrics and Gynecology

## 2021-03-22 VITALS — BP 155/73 | HR 73

## 2021-03-22 DIAGNOSIS — N3281 Overactive bladder: Secondary | ICD-10-CM | POA: Diagnosis not present

## 2021-03-22 NOTE — Telephone Encounter (Signed)
Lvm to call back

## 2021-03-22 NOTE — Telephone Encounter (Signed)
Spoke with Nurse Glenard Haring and told her the information. She states that she unable to tell the pt to go to the ER unless the pcp says. I told her that Dr.Blyth thinks it's best that she went. She states that she will make pt aware.

## 2021-03-22 NOTE — Progress Notes (Signed)
[facility]  PTNS VISIT  CC:  Overactive bladder  86 y.o. with refractory overactive bladder who presents for percutaneous tibial nerve stimulation. The patient presents for PTNS session # 4.   Procedure: The patient spontaneously voided prior to beginning the procedure. The patient was placed in the sitting position and the left lower extremity was prepped in the usual fashion. The PTNS needle was then inserted at a 60 degree angle, 5 cm cephalad and 2 cm posterior to the medial malleolus. The PTNS unit was then programmed and an optimal response was noted at a setting of  8 milliamps. The PTNS stimulation was then performed at this setting for 30 minutes without incident and the patient tolerated the procedure well. The needle was removed and hemostasis was noted.   The pt will return in 1 week for PTNS session # 5. All questions were answered.  Jaquita Folds, MD

## 2021-03-26 ENCOUNTER — Other Ambulatory Visit: Payer: Self-pay | Admitting: Family Medicine

## 2021-03-29 ENCOUNTER — Ambulatory Visit (INDEPENDENT_AMBULATORY_CARE_PROVIDER_SITE_OTHER): Payer: Medicare HMO | Admitting: *Deleted

## 2021-03-29 ENCOUNTER — Ambulatory Visit: Payer: Medicare HMO

## 2021-03-29 ENCOUNTER — Other Ambulatory Visit: Payer: Self-pay

## 2021-03-29 ENCOUNTER — Telehealth: Payer: Self-pay | Admitting: Cardiovascular Disease

## 2021-03-29 DIAGNOSIS — N3281 Overactive bladder: Secondary | ICD-10-CM | POA: Diagnosis not present

## 2021-03-29 MED ORDER — FUROSEMIDE 20 MG PO TABS: 20.0000 mg | ORAL_TABLET | Freq: Every day | ORAL | 11 refills | Status: DC

## 2021-03-29 NOTE — Telephone Encounter (Signed)
Pt c/o swelling: STAT is pt has developed SOB within 24 hours  How much weight have you gained and in what time span?  No significant weight gain   If swelling, where is the swelling located?  Legs and feet   Are you currently taking a fluid pill?  Yes, patient takes furosemide (LASIX) 20 MG tablet  Are you currently SOB?  No   Do you have a log of your daily weights (if so, list)?  No log available  Have you gained 3 pounds in a day or 5 pounds in a week?  No   Have you traveled recently?   No and patient states when she is sitting she has her feet elevated.

## 2021-03-29 NOTE — Telephone Encounter (Signed)
Returned call to patient who states she is having bilateral leg swelling since her discharge from the hospital August 5. She reports starting an increased dose of amlodipine around the same time. She had a visit with [facility] on 9/13 for management of hypertension. She was advised to increase losartan to 100 mg daily and restart metoprolol at 25 mg daily. She takes furosemide 20 mg but only for weight gain and she denies weight gain. She reports the following BP readings since last ov: 9/16 140/56 (prior to meds) 9/17 142/71 (prior to meds) 108/56 3:30 pm 139/59 5:30 pm 127/53 7:00 pm 9/18 132/59 0600 (prior to meds) 142/57 9:30 am (after meds) 9/21 149/65, pulse 73 today prior to meds  She reports she is wearing compression hose. I advised continued use of compression and advised I will forward to [place] for advice. Patient verbalized understanding and agreement and thanked me for the call.

## 2021-03-29 NOTE — Telephone Encounter (Signed)
Returned call to patient and reviewed advice from [place], [facility]. Patient states she carefully limits sodium in her diet. She would like to try taking the Lasix 20 mg daily. She has had good results with taking Lasix in the past, but stopped the medication because she was advised to only take it for weight gain. I verified with [facility] [place] that patient can take the Lasix 20 mg until her follow-up appointment. We elected to move patient's appointment up to 10/3. I advised patient to call back with questions or concerns prior to appointment. She verbalized understanding and agreement and thanked me for the call.

## 2021-03-29 NOTE — Telephone Encounter (Signed)
Home BP readings generally well controlled after she takes her meds. Swelling may be coming either from her diastolic CHF or as a side effect of higher dose of amlodipine. Can decrease amlodipine back to 5mg  daily to see if this improves her swelling, but BP readings will likely increase and she would need additional therapy which can't be determined until she has follow up BMET checked given recent dose increase of losartan (next f/u appt is on 10/5, BMET could be checked sooner if pt prefers this option).  Otherwise, she can take furosemide 20mg  daily for the next few days to see if that resolves her swelling, and continue other meds as directed until follow up visit. She should limit her daily sodium to < 2,000mg  daily, agree with elevating her legs and using compression stockings.

## 2021-03-30 ENCOUNTER — Encounter: Payer: Self-pay | Admitting: Family Medicine

## 2021-03-30 ENCOUNTER — Ambulatory Visit (INDEPENDENT_AMBULATORY_CARE_PROVIDER_SITE_OTHER): Payer: Medicare HMO | Admitting: Family Medicine

## 2021-03-30 ENCOUNTER — Ambulatory Visit: Payer: Self-pay

## 2021-03-30 VITALS — Ht 61.0 in | Wt 149.5 lb

## 2021-03-30 DIAGNOSIS — I083 Combined rheumatic disorders of mitral, aortic and tricuspid valves: Secondary | ICD-10-CM | POA: Diagnosis not present

## 2021-03-30 DIAGNOSIS — M25551 Pain in right hip: Secondary | ICD-10-CM | POA: Diagnosis not present

## 2021-03-30 DIAGNOSIS — M7541 Impingement syndrome of right shoulder: Secondary | ICD-10-CM

## 2021-03-30 DIAGNOSIS — J9601 Acute respiratory failure with hypoxia: Secondary | ICD-10-CM | POA: Diagnosis not present

## 2021-03-30 DIAGNOSIS — I25118 Atherosclerotic heart disease of native coronary artery with other forms of angina pectoris: Secondary | ICD-10-CM | POA: Diagnosis not present

## 2021-03-30 DIAGNOSIS — T8141XD Infection following a procedure, superficial incisional surgical site, subsequent encounter: Secondary | ICD-10-CM | POA: Diagnosis not present

## 2021-03-30 DIAGNOSIS — I452 Bifascicular block: Secondary | ICD-10-CM | POA: Diagnosis not present

## 2021-03-30 DIAGNOSIS — E119 Type 2 diabetes mellitus without complications: Secondary | ICD-10-CM | POA: Diagnosis not present

## 2021-03-30 DIAGNOSIS — I110 Hypertensive heart disease with heart failure: Secondary | ICD-10-CM | POA: Diagnosis not present

## 2021-03-30 DIAGNOSIS — N179 Acute kidney failure, unspecified: Secondary | ICD-10-CM | POA: Diagnosis not present

## 2021-03-30 DIAGNOSIS — I5033 Acute on chronic diastolic (congestive) heart failure: Secondary | ICD-10-CM | POA: Diagnosis not present

## 2021-03-30 MED ORDER — METHYLPREDNISOLONE ACETATE 40 MG/ML IJ SUSP
40.0000 mg | Freq: Once | INTRAMUSCULAR | Status: AC
  Administered 2021-03-30: 40 mg via INTRA_ARTICULAR

## 2021-03-30 NOTE — Progress Notes (Signed)
Pt here for percutaneous tibial nerve stimulation session #5.    The patient was placed in the sitting position and the left lower extremity was prepped in the usual fashion. The PTNS needle was then inserted at a 60 degree angle, 5 cm cephalad and 2 cm posterior to the medial malleolus. The PTNS unit was then programmed and an optimal response was noted at a setting of  6 milliamps. The PTNS stimulation was then performed at this setting for 30 minutes without incident and the patient tolerated the procedure well. The needle was removed and hemostasis was noted.    The pt will return in 1 week for PTNS session # 6

## 2021-03-30 NOTE — Patient Instructions (Signed)
Good to see you Please use ice as needed  Please try the exercises   Please send me a message in MyChart with any questions or updates.  Please see me back in 4 weeks.   --Dr. Ian Castagna  

## 2021-03-30 NOTE — Progress Notes (Signed)
Heather [name] - 86 y.o. female MRN [id number]  Date of birth: Feb 27, 1935  SUBJECTIVE:  Including CC & ROS.  No chief complaint on file.   Heather [name] is a 86 y.o. female that is presenting with acute right hip pain.  The pain is severe and she has trouble sleeping on the affected side.  Independent review of the MRI of the right hip from 1/8 shows partial tear of the gluteus medius tendon and degenerative changes of the joint.  Review of Systems See HPI   HISTORY: Past Medical, Surgical, Social, and Family History Reviewed & Updated per EMR.   Pertinent Historical Findings include:  Past Medical History:  Diagnosis Date   Chronic stable angina ([place])    Coronary artery disease cardiologist--- dr Burt Knack   hx NSTEMI  w/ cardiac cath 03-16-2005 PCI with DES to LCx;   cardiac cath 06-27-2005  PCI with DES to RCA with residual dx LAD manage medcially;  lexiscan 01-26-20211 normal no ischemia, ef 70%;  stress echo w/ dobutamine 10-24-2011 negative ishcmeia , normal ef // Myoview 2/22: EF 67, no ischemia or infarction, no TID, low risk    Diabetes mellitus type 2, diet-controlled ([place])    followed by pcp  (10-19-2020  pt stated checks daily in am,  fasting blood surgar--- 115--120s)   DOE (dyspnea on exertion)    per pt "when I over do",  ok with household chores   Echocardiogram 08/2020    Echocardiogram 2/22: EF 55-60, no RWMA, mild LVH, Gr 2 DD, GLS-21.7%, normal RVSF, trivial MR, RVSP 39.5   Edema of right lower extremity    GERD (gastroesophageal reflux disease)    Hiatal hernia    recurrence,  hx HH repair 1989   History of cervical cancer    s/p  vaginal hysterectomy   History of DVT of lower extremity 2016   11-29-2014 post op right TKA of right lower extremity and completed xarelto    History of esophageal stricture    hx s/p dilatation's   History of gastric ulcer 2005 approx.   History of palpitations 2010   event monitor 07-07-2009 showed NSR w/ freq. SVT ectopies with  short runs, rare PVCs   History of TIA (transient ischemic attack) 06/1999   12-15-2019  per pt had several TIA between 12/ 2000 to 02/ 2001 , was sent to specialist @[facility] , had test that was normal (10-19-2020 pt stated no TIAs since ) but has residual of essential tremors of right arm/ hand   Hypertension    Intermittent palpitations    IT band syndrome    Migraine    "ice pick headche lasts about 30 seconds"   Mixed hyperlipidemia    Mixed incontinence urge and stress    Multiple thyroid nodules    followed by pcp---   ultrasound 11-22-2014 no bx   (12-15-2019 per pt had a endocrinologist and was told did not need bx)   OA (osteoarthritis)    knees, elbow, hip, ankles   Occasional tremors    right arm/ hand  s/p TIA residual 2000   Osteoporosis    taking vitamin d   Peroneal DVT (deep venous thrombosis) ([place]) 12/22/2014   Right bundle branch block (RBBB) with left anterior fascicular block (LAFB)    RLS (restless legs syndrome)    S/P drug eluting coronary stent placement 2006   03-16-2005  PCI x1 DES to LCx;   06-27-2005  PCI x1 DES to RCA   Urinary retention  post op sling prodecure on 10-27-2020, has foley cathether    Past Surgical History:  Procedure Laterality Date   ANTERIOR AND POSTERIOR REPAIR N/A 12/22/2019   Procedure: ANTERIOR (CYSTOCELE)  REPAIR;  Surgeon: Janyth Contes, MD;  Location: [facility];  Service: Gynecology;  Laterality: N/A;   ANTERIOR AND POSTERIOR REPAIR WITH SACROSPINOUS FIXATION N/A 10/27/2020   Procedure: SACROSPINOUS LIGAMENT FIXATION;  Surgeon: Jaquita Folds, MD;  Location: [facility];  Service: Gynecology;  Laterality: N/A;   BLADDER SUSPENSION N/A 10/27/2020   Procedure: TRANSVAGINAL TAPE (TVT) PROCEDURE;  Surgeon: Jaquita Folds, MD;  Location: [facility];  Service: Gynecology;  Laterality: N/A;   CATARACT EXTRACTION W/ INTRAOCULAR LENS  IMPLANT, BILATERAL  2015    CHOLECYSTECTOMY N/A 01/25/2021   Procedure: LAPAROSCOPIC CHOLECYSTECTOMY WITH INTRAOPERATIVE CHOLANGIOGRAM AND LYSIS OF ADHESIONS;  Surgeon: Michael Boston, MD;  Location: [facility];  Service: General;  Laterality: N/A;   COLONOSCOPY  last one ?   CORONARY ANGIOPLASTY WITH STENT PLACEMENT  03-16-2005   dr Lia Foyer   PCI and DES x1 to LCx   CORONARY ANGIOPLASTY WITH STENT PLACEMENT  06-27-2005  dr Lia Foyer   PCI and DES x1 to RCA with residual disease LAD 70-80% to manage medically   CYSTOSCOPY N/A 10/27/2020   Procedure: CYSTOSCOPY;  Surgeon: Jaquita Folds, MD;  Location: [facility];  Service: Gynecology;  Laterality: N/A;   CYSTOSCOPY N/A 11/09/2020   Procedure: CYSTOSCOPY;  Surgeon: Jaquita Folds, MD;  Location[facility];  Service: Gynecology;  Laterality: N/A;   FOOT SURGERY Left 1990s   left foot stress fracture repair, per pt no hardware   HIATAL HERNIA REPAIR  1989   INCISIONAL HERNIA REPAIR N/A 01/25/2021   Procedure: PRIMARY REPAIR OF INCISIONAL HERNIA;  Surgeon: Michael Boston, MD;  Location: [facility];  Service: General;  Laterality: N/A;   KNEE ARTHROSCOPY Bilateral right ?/   left x2 , last one 09-12-2009 @ [place]   PUBOVAGINAL SLING N/A 11/09/2020   Procedure: Dent;  Surgeon: Jaquita Folds, MD;  Location: [facility];  Service: Gynecology;  Laterality: N/A;   RECTOCELE REPAIR N/A 04/20/2020   Procedure: POSTERIOR REPAIR (RECTOCELE);  Surgeon: Janyth Contes, MD;  Location: [facility];  Service: Gynecology;  Laterality: N/A;   RECTOCELE REPAIR N/A 10/27/2020   Procedure: POSTERIOR REPAIR (RECTOCELE);  Surgeon: Jaquita Folds, MD;  Location: [facility];  Service: Gynecology;  Laterality: N/A;  total time requested for all procedures is 2 hours   TOTAL KNEE ARTHROPLASTY  11/12/2011   Procedure: TOTAL KNEE ARTHROPLASTY;  Surgeon: Lorn Junes, MD;  Location:  [place];  Service: Orthopedics;  Laterality: Left;  Dr Noemi Chapel wants 90 minutes for this case   TOTAL KNEE ARTHROPLASTY Right 11/29/2014   Procedure: RIGHT TOTAL KNEE ARTHROPLASTY;  Surgeon: Vickey Huger, MD;  Location: [place];  Service: Orthopedics;  Laterality: Right;   UPPER GASTROINTESTINAL ENDOSCOPY  last one 04-25-2017   with dilatation esophageal stricture and savary dilatation   VAGINAL HYSTERECTOMY  1988    no ovaries removed for bleeeding    Family History  Problem Relation Age of Onset   Stroke Father        family hx of M 1st degree relative <50   Coronary artery disease Mother    Heart disease Mother    Depression Brother    Stroke Brother    Diabetes Brother  Cancer Brother        bladder with mets   Diabetes Daughter        borderline   Hypertension Daughter    Arthritis Other        family hx of   Hypertension Other        family hx of   Other Other        family hx of cardiovascular disorder   Thyroid disease Daughter    Breast cancer Neg Hx    Colon cancer Neg Hx    Anesthesia problems Neg Hx    Hypotension Neg Hx    Malignant hyperthermia Neg Hx    Pseudochol deficiency Neg Hx    Colon polyps Neg Hx    Esophageal cancer Neg Hx    Rectal cancer Neg Hx    Stomach cancer Neg Hx     Social History   Socioeconomic History   Marital status: Widowed    Spouse name: Not on file   Number of children: 3   Years of education: 59   Highest education level: Not on file  Occupational History   Occupation: works partime in Office manager: RETIRED    Comment: retired  Tobacco Use   Smoking status: Never   Smokeless tobacco: Never  Vaping Use   Vaping Use: Never used  Substance and Sexual Activity   Alcohol use: No    Alcohol/week: 0.0 standard drinks   Drug use: Never   Sexual activity: Not Currently    Birth control/protection: Surgical    Comment: lives alone  Other Topics Concern   Not on file  Social History Narrative   Lives with husband,  Caffeine use- Half Caffeine)- 2 cups daily.  3 children living, one passed away.   Education: HS. Business college.  Retired.    Social Determinants of Health   Financial Resource Strain: Low Risk    Difficulty of Paying Living Expenses: Not hard at all  Food Insecurity: No Food Insecurity   Worried About Charity fundraiser in the Last Year: Never true   [place] in the Last Year: Never true  Transportation Needs: No Transportation Needs   Lack of Transportation (Medical): No   Lack of Transportation (Non-Medical): No  Physical Activity: Inactive   Days of Exercise per Week: 0 days   Minutes of Exercise per Session: 0 min  Stress: Not on file  Social Connections: Not on file  Intimate Partner Violence: Not on file     PHYSICAL EXAM:  VS: Ht 5\' 1"  (1.549 m)   Wt 149 lb 8 oz (67.8 kg)   LMP  (LMP Unknown)   BMI 28.25 kg/m  Physical Exam Gen: NAD, alert, cooperative with exam, well-appearing    Aspiration/Injection Procedure Note CORNELIOUS DIVEN [birth year]  Procedure: Injection Indications: Right hip pain  Procedure Details Consent: Risks of procedure as well as the alternatives and risks of each were explained to the (patient/caregiver).  Consent for procedure obtained. Time Out: Verified patient identification, verified procedure, site/side was marked, verified correct patient position, special equipment/implants available, medications/allergies/relevent history reviewed, required imaging and test results available.  Performed.  The area was cleaned with iodine and alcohol swabs.    The right trochanteric bursa was injected using 1 cc's of 40 mg Depo-Medrol and 4 cc's of 0.25% bupivacaine with a 22 3 1/2" needle.  Ultrasound was used. Images were obtained in short views showing the injection.  A sterile dressing was applied.  Patient did tolerate procedure well.      ASSESSMENT & PLAN:   Greater trochanteric pain syndrome of right lower extremity Pain  more lateral and consistent with bursitis.  MRI from January was demonstrating partial tear of the gluteus medius which could could be contributing. -Counseled on home exercise therapy and supportive care. -Injection today. -Could consider physical therapy or intra-articular injection.

## 2021-03-31 NOTE — Assessment & Plan Note (Signed)
Pain more lateral and consistent with bursitis.  MRI from January was demonstrating partial tear of the gluteus medius which could could be contributing. -Counseled on home exercise therapy and supportive care. -Injection today. -Could consider physical therapy or intra-articular injection.

## 2021-04-04 DIAGNOSIS — I110 Hypertensive heart disease with heart failure: Secondary | ICD-10-CM | POA: Diagnosis not present

## 2021-04-04 DIAGNOSIS — E119 Type 2 diabetes mellitus without complications: Secondary | ICD-10-CM | POA: Diagnosis not present

## 2021-04-04 DIAGNOSIS — I5033 Acute on chronic diastolic (congestive) heart failure: Secondary | ICD-10-CM | POA: Diagnosis not present

## 2021-04-04 DIAGNOSIS — N179 Acute kidney failure, unspecified: Secondary | ICD-10-CM | POA: Diagnosis not present

## 2021-04-04 DIAGNOSIS — J9601 Acute respiratory failure with hypoxia: Secondary | ICD-10-CM | POA: Diagnosis not present

## 2021-04-04 DIAGNOSIS — T8141XD Infection following a procedure, superficial incisional surgical site, subsequent encounter: Secondary | ICD-10-CM | POA: Diagnosis not present

## 2021-04-04 DIAGNOSIS — I452 Bifascicular block: Secondary | ICD-10-CM | POA: Diagnosis not present

## 2021-04-04 DIAGNOSIS — I25118 Atherosclerotic heart disease of native coronary artery with other forms of angina pectoris: Secondary | ICD-10-CM | POA: Diagnosis not present

## 2021-04-04 DIAGNOSIS — I083 Combined rheumatic disorders of mitral, aortic and tricuspid valves: Secondary | ICD-10-CM | POA: Diagnosis not present

## 2021-04-05 ENCOUNTER — Ambulatory Visit (INDEPENDENT_AMBULATORY_CARE_PROVIDER_SITE_OTHER): Payer: Medicare HMO | Admitting: *Deleted

## 2021-04-05 ENCOUNTER — Other Ambulatory Visit: Payer: Self-pay

## 2021-04-05 ENCOUNTER — Encounter (HOSPITAL_BASED_OUTPATIENT_CLINIC_OR_DEPARTMENT_OTHER): Payer: Self-pay | Admitting: *Deleted

## 2021-04-05 ENCOUNTER — Ambulatory Visit: Payer: Medicare HMO

## 2021-04-05 VITALS — BP 174/69 | HR 65

## 2021-04-05 DIAGNOSIS — N3281 Overactive bladder: Secondary | ICD-10-CM | POA: Diagnosis not present

## 2021-04-05 NOTE — Progress Notes (Signed)
Pt here for percutaneous tibial nerve stimulation session #6.     The patient was placed in the sitting position and the left lower extremity was prepped in the usual fashion. The PTNS needle was then inserted at a 60 degree angle, 5 cm cephalad and 2 cm posterior to the medial malleolus. The PTNS unit was then programmed and an optimal response was noted at a setting of  5 milliamps. The PTNS stimulation was then performed at this setting for 30 minutes without incident and the patient tolerated the procedure well. The needle was removed and hemostasis was noted.    The pt will return in 1 week for PTNS session # 7.

## 2021-04-07 DIAGNOSIS — E1121 Type 2 diabetes mellitus with diabetic nephropathy: Secondary | ICD-10-CM

## 2021-04-07 DIAGNOSIS — I5032 Chronic diastolic (congestive) heart failure: Secondary | ICD-10-CM | POA: Diagnosis not present

## 2021-04-07 DIAGNOSIS — I2510 Atherosclerotic heart disease of native coronary artery without angina pectoris: Secondary | ICD-10-CM | POA: Diagnosis not present

## 2021-04-07 DIAGNOSIS — I10 Essential (primary) hypertension: Secondary | ICD-10-CM | POA: Diagnosis not present

## 2021-04-09 ENCOUNTER — Other Ambulatory Visit: Payer: Self-pay | Admitting: Physician Assistant

## 2021-04-10 ENCOUNTER — Other Ambulatory Visit: Payer: Self-pay

## 2021-04-10 ENCOUNTER — Ambulatory Visit: Payer: Medicare HMO

## 2021-04-10 ENCOUNTER — Ambulatory Visit: Payer: Medicare HMO | Admitting: Pharmacist

## 2021-04-10 VITALS — BP 148/62 | HR 68

## 2021-04-10 DIAGNOSIS — I10 Essential (primary) hypertension: Secondary | ICD-10-CM

## 2021-04-10 LAB — BASIC METABOLIC PANEL
BUN/Creatinine Ratio: 24 (ref 12–28)
BUN: 22 mg/dL (ref 8–27)
CO2: 24 mmol/L (ref 20–29)
Calcium: 9.6 mg/dL (ref 8.7–10.3)
Chloride: 101 mmol/L (ref 96–106)
Creatinine, Ser: 0.9 mg/dL (ref 0.57–1.00)
Glucose: 135 mg/dL — ABNORMAL HIGH (ref 70–99)
Potassium: 4.3 mmol/L (ref 3.5–5.2)
Sodium: 139 mmol/L (ref 134–144)
eGFR: 63 mL/min/{1.73_m2} (ref >59)

## 2021-04-10 MED ORDER — METOPROLOL SUCCINATE ER 25 MG PO TB24: 25.0000 mg | ORAL_TABLET | Freq: Every day | ORAL | 3 refills | Status: DC

## 2021-04-10 NOTE — Progress Notes (Signed)
Patient ID: Heather [name]                 DOB: [birth year]                      MRN: [id number]     HPI: Heather [name] is a 86 y.o. female referred by Dr. Burt Knack to HTN clinic. PMH is significant for CAD s/p DES to LCx and RCA in 2006, CVA, DM, HTN, HLD, GERD, post-op DVT after TKR in 2016, and palpitations. Has been hospitalized multiple times recently with a variety of problems including cholecystitis, acute diastolic CHF, and sepsis. Several meds were adjusted - her beta blocker was stopped secondary to bradycardia, ARB and spironolactone were stopped due to AKI. Reported resting tremor, fatigue and SOB with exertion at her last visit with Dr Burt Knack on 8/18. BP was elevated at 162/60 and her losartan was resumed at 60m daily with stable f/u BMET. She presents today for follow up.  At last HTN clinic visit, patients losartan was increased to 1029mdaily. She was also restarted on metoprolol succinate at 2551maily.   Patient presents today to clinic. She states her BP yesterday was 139/75. Reports that when she checks her BP at 5-6AM it is usually in the 160/170's/70's. If she checks it later in the day, then it is typically 120-130's/60-70s. She does not rest prior to checking. Has a wrist cuff that has not been verified. She forgot her list of blood pressures in the car. Reports one episode of dizziness, BP dropped to 108. This happened within the last 2 weeks. Only happened that one time.  No swelling. Hasn't taken her furosemide for awhile. Reports when her nurse comes and checks her blood pressure it is usually 120-130's/60-70's. Has not had as many palpitations since resuming metoprolol. HR today was 68.   Current HTN meds: amlodipine 58m35mily (AM), losartan 100mg4mly (AM), Imdur 90mg 26my (AM), furosemide 20mg p21mmetoprolol succinate 25mg da23m(PM)  BP goal: <130/80mmHg  23mly History: Arthritis in an other family member; Cancer in her brother; Coronary artery disease in her  mother; Depression in her brother; Diabetes in her brother and daughter; Heart disease in her mother; Hypertension in her daughter and another family member; Other in an other family member; Stroke in her brother and father; Thyroid disease in her daughter  Diet: AM - 2 cups of 1/2 caff coffee, cereal with fruit., sometimes an egg and toast. Lunch - PB crackers. Dinner - salad, chicken  Wt Readings from Last 3 Encounters:  03/30/21 149 lb 8 oz (67.8 kg)  03/15/21 150 lb (68 kg)  03/07/21 150 lb (68 kg)   BP Readings from Last 3 Encounters:  04/05/21 (!) 174/69  03/22/21 (!) 155/73  03/21/21 (!) 150/70   Pulse Readings from Last 3 Encounters:  04/05/21 65  03/22/21 73  03/21/21 85    Renal function: CrCl cannot be calculated (Patient's most recent lab result is older than the maximum 21 days allowed.).  Past Medical History:  Diagnosis Date   Chronic stable angina ([facility]   Burt KnackEMI  w/ cardiac cath 03-16-2005 PCI with DES to LCx;   cardiac cath 06-27-2005  PCI with DES to RCA with residual dx LAD manage medcially;  lexiscan 01-26-20211 normal no ischemia, ef 70%;  stress echo w/ dobutamine 10-24-2011 negative ishcmeia , normal ef // Myoview 2/22: EF 67, no ischemia or  infarction, no TID, low risk    Diabetes mellitus type 2, diet-controlled ([place])    followed by pcp  (10-19-2020  pt stated checks daily in am,  fasting blood surgar--- 115--120s)   DOE (dyspnea on exertion)    per pt "when I over do",  ok with household chores   Echocardiogram 08/2020    Echocardiogram 2/22: EF 55-60, no RWMA, mild LVH, Gr 2 DD, GLS-21.7%, normal RVSF, trivial MR, RVSP 39.5   Edema of right lower extremity    GERD (gastroesophageal reflux disease)    Hiatal hernia    recurrence,  hx HH repair 1989   History of cervical cancer    s/p  vaginal hysterectomy   History of DVT of lower extremity 2016   11-29-2014 post op right TKA of right lower  extremity and completed xarelto    History of esophageal stricture    hx s/p dilatation's   History of gastric ulcer 2005 approx.   History of palpitations 2010   event monitor 07-07-2009 showed NSR w/ freq. SVT ectopies with short runs, rare PVCs   History of TIA (transient ischemic attack) 06/1999   12-15-2019  per pt had several TIA between 12/ 2000 to 02/ 2001 , was sent to specialist '@[facility]' , had test that was normal (10-19-2020 pt stated no TIAs since ) but has residual of essential tremors of right arm/ hand   Hypertension    Intermittent palpitations    IT band syndrome    Migraine    "ice pick headche lasts about 30 seconds"   Mixed hyperlipidemia    Mixed incontinence urge and stress    Multiple thyroid nodules    followed by pcp---   ultrasound 11-22-2014 no bx   (12-15-2019 per pt had a endocrinologist and was told did not need bx)   OA (osteoarthritis)    knees, elbow, hip, ankles   Occasional tremors    right arm/ hand  s/p TIA residual 2000   Osteoporosis    taking vitamin d   Peroneal DVT (deep venous thrombosis) ([place]) 12/22/2014   Right bundle branch block (RBBB) with left anterior fascicular block (LAFB)    RLS (restless legs syndrome)    S/P drug eluting coronary stent placement 2006   03-16-2005  PCI x1 DES to LCx;   06-27-2005  PCI x1 DES to RCA   Urinary retention    post op sling prodecure on 10-27-2020, has foley cathether    Current Outpatient Medications on File Prior to Visit  Medication Sig Dispense Refill   acetaminophen (TYLENOL) 500 MG tablet Take 1,000 mg by mouth every 6 (six) hours as needed for moderate pain.     amLODipine (NORVASC) 10 MG tablet TAKE 1 TABLET EVERY DAY 90 tablet 1   Ascorbic Acid (VITAMIN C) 500 MG CHEW Chew 500 mg by mouth daily.     aspirin EC 81 MG tablet Take 1 tablet (81 mg total) by mouth daily.     Blood Glucose Monitoring Suppl (TRUE METRIX AIR GLUCOSE METER) w/Device KIT USE TO CHECK BLOOD SUGAR ONCE DAILY AND AS NEEDED.   DX CODE E11.9 1 kit 0   cephALEXin (KEFLEX) 250 MG capsule Take 1 capsule (250 mg total) by mouth daily. 30 capsule 5   Cholecalciferol (VITAMIN D) 50 MCG (2000 UT) CAPS Take 2,000 Units by mouth daily.      diclofenac Sodium (VOLTAREN) 1 % GEL Apply 1 application topically 4 (four) times daily as needed (hip pain).  famotidine (PEPCID) 20 MG tablet Take 20 mg by mouth at bedtime as needed for heartburn or indigestion.     furosemide (LASIX) 20 MG tablet Take 1 tablet (20 mg total) by mouth daily. 30 tablet 11   glucose blood (TRUE METRIX BLOOD GLUCOSE TEST) test strip USE TO CHECK BLOOD SUGAR ONCE A DAY OR AS NEEDED.  DX CODE: E11.9 200 each 1   isosorbide mononitrate (IMDUR) 60 MG 24 hr tablet Take 1.5 tablets (90 mg total) by mouth daily. 135 tablet 3   ketorolac (TORADOL) 10 MG tablet Take 1 tablet (10 mg total) by mouth every 6 (six) hours as needed. 20 tablet 0   losartan (COZAAR) 100 MG tablet Take 1 tablet (100 mg total) by mouth daily. 90 tablet 3   magnesium oxide (MAG-OX) 400 MG tablet Take 400 mg by mouth at bedtime.     metoprolol succinate (TOPROL-XL) 50 MG 24 hr tablet Take 0.5 tablets (25 mg total) by mouth daily. Take with or immediately following a meal. 90 tablet 3   nitroGLYCERIN (NITROSTAT) 0.4 MG SL tablet Place 1 tablet (0.4 mg total) under the tongue every 5 (five) minutes as needed for chest pain. 25 tablet 11   nystatin (MYCOSTATIN/NYSTOP) powder Apply 1 application topically 3 (three) times daily. 60 g 0   omeprazole (PRILOSEC) 20 MG capsule Take 1 capsule (20 mg total) by mouth in the morning.     ondansetron (ZOFRAN ODT) 4 MG disintegrating tablet Take 1 tablet (4 mg total) by mouth every 6 (six) hours as needed for nausea or vomiting. 30 tablet 0   potassium chloride SA (KLOR-CON) 20 MEQ tablet Take 0.5 tablets (10 mEq total) by mouth daily. 45 tablet 2   pramipexole (MIRAPEX) 0.25 MG tablet TAKE 1 TABLET TWICE DAILY 180 tablet 1   simvastatin (ZOCOR) 20 MG tablet  TAKE 1 TABLET AT BEDTIME 90 tablet 2   TRUEplus Lancets 33G MISC USE TO CHECK BLOOD SUGAR ONCE DAILY AND AS NEEDED.  DX CODE: E11.9 200 each 1   Zinc 50 MG TABS Take 50 mg by mouth daily.     No current facility-administered medications on file prior to visit.    Allergies  Allergen Reactions   Baclofen Other (See Comments)    Hyperactivity    Nitrofurantoin Rash   Tramadol Other (See Comments)    Hallucinations      Assessment/Plan:  1. Hypertension - BP remains above goal < 130/38mHg. Home readings in the afternoon are better than clinic reading today. She states her BP is usually higher at the doctors office. Blood pressure in the AM is probably elevated for a few reasons. Possible that her losartan is not lasting 24hr. Will try splitting losartan to 517mBID to see if this givens better AM control. In the future could also try a stronger ARB. Continue amlodipine 1083maily (AM), Imdur 10m67mily (AM), furosemide 20mg7mneeded and metoprolol succinate 25mg 3my (PM). Will follow up in 3 weeks for BP check. I have asked her to bring her BP cuff and log. I also requested she rest 5 min before checking BP. BMP drawn today since increasing losartan at last visit.  MelissRamond Dialm.D, BCPS, CPP [facility] [id number]: [phone]) [phone]/2022 10:27 AM

## 2021-04-10 NOTE — Patient Instructions (Addendum)
Please start taking losartan 50mg  (1/2 tablet) in the AM and 50mg  in the PM Continue amlodipine 10mg  daily (AM),Imdur 90mg  daily (AM), furosemide 20mg  as needed and metoprolol succinate 25mg  daily (PM)  Please bring you blood pressure cuff and readings to your next visit  Make sure you rest 5 min prior to checking your blood pressure  Call us with any questions [phone]

## 2021-04-12 ENCOUNTER — Ambulatory Visit: Payer: Medicare HMO

## 2021-04-12 ENCOUNTER — Ambulatory Visit (INDEPENDENT_AMBULATORY_CARE_PROVIDER_SITE_OTHER): Payer: Medicare HMO | Admitting: Obstetrics and Gynecology

## 2021-04-12 ENCOUNTER — Other Ambulatory Visit: Payer: Self-pay

## 2021-04-12 VITALS — BP 154/75 | HR 65

## 2021-04-12 DIAGNOSIS — N3281 Overactive bladder: Secondary | ICD-10-CM | POA: Diagnosis not present

## 2021-04-12 NOTE — Progress Notes (Signed)
[facility]  PTNS VISIT  CC:  Overactive bladder  86 y.o. with refractory overactive bladder who presents for percutaneous tibial nerve stimulation. The patient presents for PTNS session # 7.  Feels that symptoms have improved. Brought updated voiding diary. Has leakage about 3 times in 24 hours compared to 5-6 previously. Still voiding at night.          Procedure: The patient was placed in the sitting position and the left lower extremity was prepped in the usual fashion. The PTNS needle was then inserted at a 60 degree angle, 5 cm cephalad and 2 cm posterior to the medial malleolus. The PTNS unit was then programmed and an optimal response was noted at a setting of  4 milliamps. The PTNS stimulation was then performed at this setting for 30 minutes without incident and the patient tolerated the procedure well. The needle was removed and hemostasis was noted.   The pt will return in 1 week for PTNS session # 8. Advised to reduce intake of decaf coffee, tea or soda in the evening.  All questions were answered.  Jaquita Folds, MD

## 2021-04-13 DIAGNOSIS — I452 Bifascicular block: Secondary | ICD-10-CM | POA: Diagnosis not present

## 2021-04-13 DIAGNOSIS — J9601 Acute respiratory failure with hypoxia: Secondary | ICD-10-CM | POA: Diagnosis not present

## 2021-04-13 DIAGNOSIS — I25118 Atherosclerotic heart disease of native coronary artery with other forms of angina pectoris: Secondary | ICD-10-CM | POA: Diagnosis not present

## 2021-04-13 DIAGNOSIS — N179 Acute kidney failure, unspecified: Secondary | ICD-10-CM | POA: Diagnosis not present

## 2021-04-13 DIAGNOSIS — I5033 Acute on chronic diastolic (congestive) heart failure: Secondary | ICD-10-CM | POA: Diagnosis not present

## 2021-04-13 DIAGNOSIS — I110 Hypertensive heart disease with heart failure: Secondary | ICD-10-CM | POA: Diagnosis not present

## 2021-04-13 DIAGNOSIS — T8141XD Infection following a procedure, superficial incisional surgical site, subsequent encounter: Secondary | ICD-10-CM | POA: Diagnosis not present

## 2021-04-13 DIAGNOSIS — I083 Combined rheumatic disorders of mitral, aortic and tricuspid valves: Secondary | ICD-10-CM | POA: Diagnosis not present

## 2021-04-13 DIAGNOSIS — E119 Type 2 diabetes mellitus without complications: Secondary | ICD-10-CM | POA: Diagnosis not present

## 2021-04-14 ENCOUNTER — Encounter (HOSPITAL_BASED_OUTPATIENT_CLINIC_OR_DEPARTMENT_OTHER): Payer: Self-pay | Admitting: Radiology

## 2021-04-14 ENCOUNTER — Emergency Department (HOSPITAL_BASED_OUTPATIENT_CLINIC_OR_DEPARTMENT_OTHER)
Admission: EM | Admit: 2021-04-14 | Discharge: 2021-04-15 | Disposition: A | Discharge status: Home / Self Care | Payer: Medicare HMO | Source: Home / Self Care | Attending: Emergency Medicine | Admitting: Emergency Medicine

## 2021-04-14 ENCOUNTER — Other Ambulatory Visit: Payer: Self-pay

## 2021-04-14 ENCOUNTER — Emergency Department (HOSPITAL_BASED_OUTPATIENT_CLINIC_OR_DEPARTMENT_OTHER): Payer: Medicare HMO

## 2021-04-14 DIAGNOSIS — Z7982 Long term (current) use of aspirin: Secondary | ICD-10-CM | POA: Insufficient documentation

## 2021-04-14 DIAGNOSIS — Z955 Presence of coronary angioplasty implant and graft: Secondary | ICD-10-CM | POA: Insufficient documentation

## 2021-04-14 DIAGNOSIS — R110 Nausea: Secondary | ICD-10-CM | POA: Insufficient documentation

## 2021-04-14 DIAGNOSIS — K449 Diaphragmatic hernia without obstruction or gangrene: Secondary | ICD-10-CM | POA: Diagnosis not present

## 2021-04-14 DIAGNOSIS — I2510 Atherosclerotic heart disease of native coronary artery without angina pectoris: Secondary | ICD-10-CM | POA: Insufficient documentation

## 2021-04-14 DIAGNOSIS — G43909 Migraine, unspecified, not intractable, without status migrainosus: Secondary | ICD-10-CM | POA: Diagnosis present

## 2021-04-14 DIAGNOSIS — R1010 Upper abdominal pain, unspecified: Secondary | ICD-10-CM | POA: Diagnosis not present

## 2021-04-14 DIAGNOSIS — R1030 Lower abdominal pain, unspecified: Secondary | ICD-10-CM | POA: Diagnosis not present

## 2021-04-14 DIAGNOSIS — R1084 Generalized abdominal pain: Secondary | ICD-10-CM | POA: Insufficient documentation

## 2021-04-14 DIAGNOSIS — Z96653 Presence of artificial knee joint, bilateral: Secondary | ICD-10-CM | POA: Insufficient documentation

## 2021-04-14 DIAGNOSIS — Z8541 Personal history of malignant neoplasm of cervix uteri: Secondary | ICD-10-CM | POA: Insufficient documentation

## 2021-04-14 DIAGNOSIS — K4030 Unilateral inguinal hernia, with obstruction, without gangrene, not specified as recurrent: Secondary | ICD-10-CM | POA: Diagnosis not present

## 2021-04-14 DIAGNOSIS — Z20822 Contact with and (suspected) exposure to covid-19: Secondary | ICD-10-CM | POA: Diagnosis not present

## 2021-04-14 DIAGNOSIS — I110 Hypertensive heart disease with heart failure: Secondary | ICD-10-CM | POA: Diagnosis not present

## 2021-04-14 DIAGNOSIS — M1990 Unspecified osteoarthritis, unspecified site: Secondary | ICD-10-CM | POA: Diagnosis present

## 2021-04-14 DIAGNOSIS — I5033 Acute on chronic diastolic (congestive) heart failure: Secondary | ICD-10-CM | POA: Diagnosis not present

## 2021-04-14 DIAGNOSIS — R1031 Right lower quadrant pain: Secondary | ICD-10-CM | POA: Insufficient documentation

## 2021-04-14 DIAGNOSIS — R112 Nausea with vomiting, unspecified: Secondary | ICD-10-CM | POA: Diagnosis not present

## 2021-04-14 DIAGNOSIS — K219 Gastro-esophageal reflux disease without esophagitis: Secondary | ICD-10-CM | POA: Insufficient documentation

## 2021-04-14 DIAGNOSIS — E1165 Type 2 diabetes mellitus with hyperglycemia: Secondary | ICD-10-CM | POA: Diagnosis not present

## 2021-04-14 DIAGNOSIS — E119 Type 2 diabetes mellitus without complications: Secondary | ICD-10-CM | POA: Insufficient documentation

## 2021-04-14 DIAGNOSIS — N179 Acute kidney failure, unspecified: Secondary | ICD-10-CM | POA: Diagnosis not present

## 2021-04-14 DIAGNOSIS — M810 Age-related osteoporosis without current pathological fracture: Secondary | ICD-10-CM | POA: Diagnosis present

## 2021-04-14 DIAGNOSIS — R109 Unspecified abdominal pain: Secondary | ICD-10-CM | POA: Diagnosis not present

## 2021-04-14 DIAGNOSIS — Z818 Family history of other mental and behavioral disorders: Secondary | ICD-10-CM | POA: Diagnosis not present

## 2021-04-14 DIAGNOSIS — G250 Essential tremor: Secondary | ICD-10-CM | POA: Diagnosis present

## 2021-04-14 DIAGNOSIS — E782 Mixed hyperlipidemia: Secondary | ICD-10-CM | POA: Diagnosis present

## 2021-04-14 DIAGNOSIS — Z79899 Other long term (current) drug therapy: Secondary | ICD-10-CM | POA: Insufficient documentation

## 2021-04-14 DIAGNOSIS — I5032 Chronic diastolic (congestive) heart failure: Secondary | ICD-10-CM | POA: Diagnosis not present

## 2021-04-14 DIAGNOSIS — I700 Atherosclerosis of aorta: Secondary | ICD-10-CM | POA: Diagnosis not present

## 2021-04-14 DIAGNOSIS — N3281 Overactive bladder: Secondary | ICD-10-CM | POA: Diagnosis present

## 2021-04-14 DIAGNOSIS — Z823 Family history of stroke: Secondary | ICD-10-CM | POA: Diagnosis not present

## 2021-04-14 DIAGNOSIS — G2581 Restless legs syndrome: Secondary | ICD-10-CM | POA: Diagnosis not present

## 2021-04-14 DIAGNOSIS — R339 Retention of urine, unspecified: Secondary | ICD-10-CM | POA: Diagnosis present

## 2021-04-14 DIAGNOSIS — I452 Bifascicular block: Secondary | ICD-10-CM | POA: Diagnosis not present

## 2021-04-14 DIAGNOSIS — Z23 Encounter for immunization: Secondary | ICD-10-CM | POA: Diagnosis not present

## 2021-04-14 DIAGNOSIS — E1169 Type 2 diabetes mellitus with other specified complication: Secondary | ICD-10-CM | POA: Diagnosis not present

## 2021-04-14 DIAGNOSIS — K5730 Diverticulosis of large intestine without perforation or abscess without bleeding: Secondary | ICD-10-CM | POA: Diagnosis present

## 2021-04-14 DIAGNOSIS — N390 Urinary tract infection, site not specified: Secondary | ICD-10-CM | POA: Diagnosis not present

## 2021-04-14 DIAGNOSIS — J8410 Pulmonary fibrosis, unspecified: Secondary | ICD-10-CM | POA: Diagnosis not present

## 2021-04-14 DIAGNOSIS — K56609 Unspecified intestinal obstruction, unspecified as to partial versus complete obstruction: Secondary | ICD-10-CM | POA: Diagnosis present

## 2021-04-14 LAB — URINALYSIS, ROUTINE W REFLEX MICROSCOPIC
Bilirubin Urine: NEGATIVE
Glucose, UA: NEGATIVE mg/dL
Hgb urine dipstick: NEGATIVE
Ketones, ur: NEGATIVE mg/dL
Leukocytes,Ua: NEGATIVE
Nitrite: NEGATIVE
Protein, ur: NEGATIVE mg/dL
Specific Gravity, Urine: 1.005 (ref 1.005–1.030)
pH: 6 (ref 5.0–8.0)

## 2021-04-14 LAB — CBC WITH DIFFERENTIAL/PLATELET
Abs Immature Granulocytes: 0.02 10*3/uL (ref 0.00–0.07)
Basophils Absolute: 0 10*3/uL (ref 0.0–0.1)
Basophils Relative: 0 %
Eosinophils Absolute: 0.2 10*3/uL (ref 0.0–0.5)
Eosinophils Relative: 4 %
HCT: 41.4 % (ref 36.0–46.0)
Hemoglobin: 13.5 g/dL (ref 12.0–15.0)
Immature Granulocytes: 0 %
Lymphocytes Relative: 24 %
Lymphs Abs: 1.6 10*3/uL (ref 0.7–4.0)
MCH: 29.3 pg (ref 26.0–34.0)
MCHC: 32.6 g/dL (ref 30.0–36.0)
MCV: 89.8 fL (ref 80.0–100.0)
Monocytes Absolute: 0.7 10*3/uL (ref 0.1–1.0)
Monocytes Relative: 10 %
Neutro Abs: 4.2 10*3/uL (ref 1.7–7.7)
Neutrophils Relative %: 62 %
Platelets: 164 10*3/uL (ref 150–400)
RBC: 4.61 MIL/uL (ref 3.87–5.11)
RDW: 13 % (ref 11.5–15.5)
WBC: 6.7 10*3/uL (ref 4.0–10.5)
nRBC: 0 % (ref 0.0–0.2)

## 2021-04-14 LAB — COMPREHENSIVE METABOLIC PANEL
ALT: 19 U/L (ref 0–44)
AST: 18 U/L (ref 15–41)
Albumin: 4.6 g/dL (ref 3.5–5.0)
Alkaline Phosphatase: 96 U/L (ref 38–126)
Anion gap: 10 (ref 5–15)
BUN: 15 mg/dL (ref 8–23)
CO2: 26 mmol/L (ref 22–32)
Calcium: 9.4 mg/dL (ref 8.9–10.3)
Chloride: 99 mmol/L (ref 98–111)
Creatinine, Ser: 0.76 mg/dL (ref 0.44–1.00)
GFR, Estimated: >60 mL/min (ref >60)
Glucose, Bld: 129 mg/dL — ABNORMAL HIGH (ref 70–99)
Potassium: 3.7 mmol/L (ref 3.5–5.1)
Sodium: 135 mmol/L (ref 135–145)
Total Bilirubin: 0.6 mg/dL (ref 0.3–1.2)
Total Protein: 7.1 g/dL (ref 6.5–8.1)

## 2021-04-14 MED ORDER — ONDANSETRON HCL 4 MG/2ML IJ SOLN
4.0000 mg | Freq: Once | INTRAMUSCULAR | Status: AC | PRN
  Administered 2021-04-14: 4 mg via INTRAVENOUS
  Filled 2021-04-14: qty 2

## 2021-04-14 MED ORDER — MORPHINE SULFATE (PF) 4 MG/ML IV SOLN
4.0000 mg | Freq: Once | INTRAVENOUS | Status: AC
  Administered 2021-04-14: 4 mg via INTRAVENOUS
  Filled 2021-04-14: qty 1

## 2021-04-14 MED ORDER — LACTATED RINGERS IV BOLUS
500.0000 mL | Freq: Once | INTRAVENOUS | Status: AC
  Administered 2021-04-14: 500 mL via INTRAVENOUS

## 2021-04-14 MED ORDER — IOHEXOL 300 MG/ML  SOLN
100.0000 mL | Freq: Once | INTRAMUSCULAR | Status: AC | PRN
  Administered 2021-04-14: 100 mL via INTRAVENOUS

## 2021-04-14 NOTE — ED Triage Notes (Addendum)
  Patient comes in with lower abdominal pain  that started earlier today.  Hx of diverticulitis.  Patient states pain is severe, and throbbing.  Feels like lower abdomen is swollen.  Patient states she had bowel movement earlier today and has had no issues voiding.  Nausea with no vomiting.  Pain 10/10.  Patient had gallbladder removed and hiatal hernia repair in July 2022.

## 2021-04-14 NOTE — ED Provider Notes (Signed)
[facility]ER[facility] DEPT Provider Note   CSN: [id number] Arrival date & time: 04/14/21  2010     History Chief Complaint  Patient presents with   Abdominal Pain   Nausea    Heather [name] is a 86 y.o. female.   Abdominal Pain Pain location:  RLQ Duration:  1 day Associated symptoms: nausea   Associated symptoms: no chest pain, no chills, no constipation, no cough, no diarrhea, no dysuria, no fever, no hematochezia, no hematuria, no melena, no shortness of breath, no sore throat and no vomiting   Patient presents for right lower quadrant pain starting this morning.  Low level pain has been present throughout the day.  Patient has also experienced waves of worsening severity.  She has had intermittent nausea without vomiting.  Because of the pain, she has had decreased appetite and has not eaten any food today.  She has had normal bowel movements, including several today.  She states that this is normal for her.  She has not had any known fevers or chills.  She has a history of multiple abdominal surgeries.  The symptoms she is experiencing today were also present 3 weeks ago.  She states that 3 weeks ago, she had similar right lower quadrant pain.  At that time, she did have associated vomiting.  She was not evaluated during this previous episode.  It did resolve on its own.  She has undergone cholecystectomy and hysterectomy.  She does believe that she still has a appendix.    Past Medical History:  Diagnosis Date   Chronic stable angina [facility])    Coronary artery disease cardiologist--- dr Burt Knack   hx NSTEMI  w/ cardiac cath 03-16-2005 PCI with DES to LCx;   cardiac cath 06-27-2005  PCI with DES to RCA with residual dx LAD manage medcially;  lexiscan 01-26-20211 normal no ischemia, ef 70%;  stress echo w/ dobutamine 10-24-2011 negative ishcmeia , normal ef // Myoview 2/22: EF 67, no ischemia or infarction, no TID, low risk    Diabetes mellitus type 2, diet-controlled  ([employer])    followed by pcp  (10-19-2020  pt stated checks daily in am,  fasting blood surgar--- 115--120s)   DOE (dyspnea on exertion)    per pt "when I over do",  ok with household chores   Echocardiogram 08/2020    Echocardiogram 2/22: EF 55-60, no RWMA, mild LVH, Gr 2 DD, GLS-21.7%, normal RVSF, trivial MR, RVSP 39.5   Edema of right lower extremity    GERD (gastroesophageal reflux disease)    Hiatal hernia    recurrence,  hx HH repair 1989   History of cervical cancer    s/p  vaginal hysterectomy   History of DVT of lower extremity 2016   11-29-2014 post op right TKA of right lower extremity and completed xarelto    History of esophageal stricture    hx s/p dilatation's   History of gastric ulcer 2005 approx.   History of palpitations 2010   event monitor 07-07-2009 showed NSR w/ freq. SVT ectopies with short runs, rare PVCs   History of TIA (transient ischemic attack) 06/1999   12-15-2019  per pt had several TIA between 12/ 2000 to 02/ 2001 , was sent to specialist _0 , had test that was normal (10-19-2020 pt stated no TIAs since ) but has residual of essential tremors of right arm/ hand   Hypertension    Intermittent palpitations    IT band syndrome    Migraine    "  ice pick headche lasts about 30 seconds"   Mixed hyperlipidemia    Mixed incontinence urge and stress    Multiple thyroid nodules    followed by pcp---   ultrasound 11-22-2014 no bx   (12-15-2019 per pt had a endocrinologist and was told did not need bx)   OA (osteoarthritis)    knees, elbow, hip, ankles   Occasional tremors    right arm/ hand  s/p TIA residual 2000   Osteoporosis    taking vitamin d   Peroneal DVT (deep venous thrombosis) ([place]) 12/22/2014   Right bundle branch block (RBBB) with left anterior fascicular block (LAFB)    RLS (restless legs syndrome)    S/P drug eluting coronary stent placement 2006   03-16-2005  PCI x1 DES to LCx;   06-27-2005  PCI x1 DES to RCA   Urinary retention    post op  sling prodecure on 10-27-2020, has foley cathether    Patient Active Problem List   Diagnosis Date Noted   SBO (small bowel obstruction) ([place]) 04/16/2021   Inguinal hernia of right side with obstruction 04/16/2021   Dysphagia 02/16/2021   Tremor 02/16/2021   Sepsis ([place]) 02/05/2021   AKI (acute kidney injury) ([place]) 02/05/2021   Chronic diastolic CHF (congestive heart failure) ([place]) 02/05/2021   UTI (urinary tract infection) 01/31/2021   Acute on chronic diastolic CHF (congestive heart failure) ([place]) 01/28/2021   Intra-abdominal fluid collection 01/28/2021   Incarcerated incisional hernia s/p primary repair 01/25/2021 01/25/2021   Stress reaction of bone 01/05/2021   Pain of right sternoclavicular joint 01/05/2021   Screening for osteoporosis 01/05/2021   RUQ pain 12/01/2020   Chronic calculous cholecystitis s/p lap cholecystectomy 01/25/2021 12/01/2020   Low back pain radiating to right leg 07/14/2020   Pelvic relaxation due to rectocele 04/06/2020   Prolapse of female pelvic organs 12/22/2019   Greater trochanteric pain syndrome of right lower extremity 10/13/2019   Urinary incontinence, mixed 10/01/2019   Pelvic floor relaxation 06/08/2019   Deep venous thrombosis ([place]) 03/19/2019   Goiter 03/19/2019   Transient ischemic attack 03/19/2019   Acute dermatitis 03/19/2019   Pelvic prolapse 03/08/2019   Pain of breast 01/29/2019   Overactive bladder 01/29/2019   Educated about COVID-19 virus infection 12/02/2018   Hyperlipidemia associated with type 2 diabetes mellitus ([place]) 05/27/2017   Headache 04/17/2015   Multinodular goiter 01/17/2015   S/P total knee arthroplasty 11/29/2014   Insomnia 02/05/2014   Preventative health care 11/22/2013   RLS (restless legs syndrome) 10/04/2013   Lower urinary tract infectious disease 10/04/2013   Cataracts, bilateral 04/02/2013   Hypokalemia 01/05/2012   Anemia 01/05/2012   Mixed anxiety and depressive disorder 01/05/2012   Postoperative  anemia due to acute blood loss 11/15/2011   Staphylococcus aureus carrier 11/15/2011   Pre-syncope 10/13/2011   DM (diabetes mellitus) ([place]) 10/06/2011   Pulmonary nodule 10/06/2011   Epigastric pain 10/06/2011   It band syndrome, right 08/28/2011   Renal insufficiency 08/28/2011   CORONARY ATHEROSCLEROSIS NATIVE CORONARY ARTERY 03/07/2010   Coronary atherosclerosis 01/18/2009   FATIGUE 01/18/2009   PERSISTENT DISORDER INITIATING/MAINTAINING SLEEP 09/09/2008   DERMATITIS 10/16/2007   PERIPHERAL EDEMA 10/16/2007   Essential hypertension 04/14/2007   Gastroesophageal reflux disease with hiatal hernia 04/14/2007    Past Surgical History:  Procedure Laterality Date   ANTERIOR AND POSTERIOR REPAIR N/A 12/22/2019   Procedure: ANTERIOR (CYSTOCELE)  REPAIR;  Surgeon: Janyth Contes, MD;  Location: [facility];  Service: Gynecology;  Laterality: N/A;  ANTERIOR AND POSTERIOR REPAIR WITH SACROSPINOUS FIXATION N/A 10/27/2020   Procedure: SACROSPINOUS LIGAMENT FIXATION;  Surgeon: Jaquita Folds, MD;  Location: [facility];  Service: Gynecology;  Laterality: N/A;   BLADDER SUSPENSION N/A 10/27/2020   Procedure: TRANSVAGINAL TAPE (TVT) PROCEDURE;  Surgeon: Jaquita Folds, MD;  Location: [facility];  Service: Gynecology;  Laterality: N/A;   CATARACT EXTRACTION W/ INTRAOCULAR LENS  IMPLANT, BILATERAL  2015   CHOLECYSTECTOMY N/A 01/25/2021   Procedure: LAPAROSCOPIC CHOLECYSTECTOMY WITH INTRAOPERATIVE CHOLANGIOGRAM AND LYSIS OF ADHESIONS;  Surgeon: Michael Boston, MD;  Location: [facility];  Service: General;  Laterality: N/A;   COLONOSCOPY  last one ?   CORONARY ANGIOPLASTY WITH STENT PLACEMENT  03-16-2005   dr Lia Foyer   PCI and DES x1 to LCx   CORONARY ANGIOPLASTY WITH STENT PLACEMENT  06-27-2005  dr Lia Foyer   PCI and DES x1 to RCA with residual disease LAD 70-80% to manage medically   CYSTOSCOPY N/A 10/27/2020   Procedure: CYSTOSCOPY;   Surgeon: Jaquita Folds, MD;  Location[facility];  Service: Gynecology;  Laterality: N/A;   CYSTOSCOPY N/A 11/09/2020   Procedure: CYSTOSCOPY;  Surgeon: Jaquita Folds, MD;  Location: [facility];  Service: Gynecology;  Laterality: N/A;   FOOT SURGERY Left 1990s   left foot stress fracture repair, per pt no hardware   HIATAL HERNIA REPAIR  1989   INCISIONAL HERNIA REPAIR N/A 01/25/2021   Procedure: PRIMARY REPAIR OF INCISIONAL HERNIA;  Surgeon: Michael Boston, MD;  Location: [facility];  Service: General;  Laterality: N/A;   KNEE ARTHROSCOPY Bilateral right ?/   left x2 , last one 09-12-2009 @ [facility]   PUBOVAGINAL SLING N/A 11/09/2020   Procedure: [place];  Surgeon: Jaquita Folds, MD;  Location: [facility];  Service: Gynecology;  Laterality: N/A;   RECTOCELE REPAIR N/A 04/20/2020   Procedure: POSTERIOR REPAIR (RECTOCELE);  Surgeon: Janyth Contes, MD;  Location: [facility];  Service: Gynecology;  Laterality: N/A;   RECTOCELE REPAIR N/A 10/27/2020   Procedure: POSTERIOR REPAIR (RECTOCELE);  Surgeon: Jaquita Folds, MD;  Location: [facility];  Service: Gynecology;  Laterality: N/A;  total time requested for all procedures is 2 hours   TOTAL KNEE ARTHROPLASTY  11/12/2011   Procedure: TOTAL KNEE ARTHROPLASTY;  Surgeon: Lorn Junes, MD;  Location: [facility];  Service: Orthopedics;  Laterality: Left;  Dr Noemi Chapel wants 90 minutes for this case   TOTAL KNEE ARTHROPLASTY Right 11/29/2014   Procedure: RIGHT TOTAL KNEE ARTHROPLASTY;  Surgeon: Vickey Huger, MD;  Location: [facility];  Service: Orthopedics;  Laterality: Right;   UPPER GASTROINTESTINAL ENDOSCOPY  last one 04-25-2017   with dilatation esophageal stricture and savary dilatation   VAGINAL HYSTERECTOMY  1988    no ovaries removed for bleeeding     OB History     Gravida  4   Para  1   Term      Preterm  1   AB       Living  4      SAB      IAB      Ectopic      Multiple      Live Births              Family History  Problem Relation Age of Onset   Stroke Father        family hx of M 1st degree relative <  62   Coronary artery disease Mother    Heart disease Mother    Depression Brother    Stroke Brother    Diabetes Brother    Cancer Brother        bladder with mets   Diabetes Daughter        borderline   Hypertension Daughter    Arthritis Other        family hx of   Hypertension Other        family hx of   Other Other        family hx of cardiovascular disorder   Thyroid disease Daughter    Breast cancer Neg Hx    Colon cancer Neg Hx    Anesthesia problems Neg Hx    Hypotension Neg Hx    Malignant hyperthermia Neg Hx    Pseudochol deficiency Neg Hx    Colon polyps Neg Hx    Esophageal cancer Neg Hx    Rectal cancer Neg Hx    Stomach cancer Neg Hx     Social History   Tobacco Use   Smoking status: Never   Smokeless tobacco: Never  Vaping Use   Vaping Use: Never used  Substance Use Topics   Alcohol use: No    Alcohol/week: 0.0 standard drinks   Drug use: Never    Home Medications Prior to Admission medications   Medication Sig Start Date End Date Taking? Authorizing Provider  dicyclomine (BENTYL) 20 MG tablet Take 1 tablet (20 mg total) by mouth 2 (two) times daily as needed for spasms (abdominal cramping). 04/15/21  Yes Mesner, Corene Cornea, MD  ondansetron (ZOFRAN ODT) 4 MG disintegrating tablet Take 1 tablet (4 mg total) by mouth every 8 (eight) hours as needed. 55m ODT q4 hours prn nausea/vomit Patient not taking: Reported on 04/16/2021 04/15/21  Yes Mesner, JCorene Cornea MD  acetaminophen (TYLENOL) 500 MG tablet Take 1,000 mg by mouth every 6 (six) hours as needed for moderate pain.    [provider]  amLODipine (NORVASC) 10 MG tablet TAKE 1 TABLET EVERY DAY 02/01/21   BMosie Lukes MD  Ascorbic Acid (VITAMIN C) 500 MG CHEW Chew 500 mg by mouth daily.     [provider]  aspirin EC 81 MG tablet Take 1 tablet (81 mg total) by mouth daily. 05/04/15   CSherren Mocha MD  Blood Glucose Monitoring Suppl (TRUE METRIX AIR GLUCOSE METER) w/Device KIT USE TO CHECK BLOOD SUGAR ONCE DAILY AND AS NEEDED.  DX CODE E11.9 07/27/20   BMosie Lukes MD  cephALEXin (KEFLEX) 250 MG capsule Take 1 capsule (250 mg total) by mouth daily. 03/15/21   SJaquita Folds MD  Cholecalciferol (VITAMIN D) 50 MCG (2000 UT) CAPS Take 2,000 Units by mouth daily.     [provider]  diclofenac Sodium (VOLTAREN) 1 % GEL Apply 1 application topically 4 (four) times daily as needed (hip pain).    [provider]  famotidine (PEPCID) 20 MG tablet Take 20 mg by mouth at bedtime as needed for heartburn or indigestion.    [provider]  furosemide (LASIX) 20 MG tablet Take 1 tablet (20 mg total) by mouth daily. 03/29/21   Supple, Megan E, RPH-CPP  glucose blood (TRUE METRIX BLOOD GLUCOSE TEST) test strip USE TO CHECK BLOOD SUGAR ONCE A DAY OR AS NEEDED.  DX CODE: E11.9 07/27/20   BMosie Lukes MD  isosorbide mononitrate (IMDUR) 60 MG 24 hr tablet Take 1.5 tablets (90 mg total)  by mouth daily. 12/14/20 12/14/21  [name] T, PA-C  ketorolac (TORADOL) 10 MG tablet Take 1 tablet (10 mg total) by mouth every 6 (six) hours as needed. Patient not taking: Reported on 04/16/2021 10/30/20   Jaquita Folds, MD  losartan (COZAAR) 100 MG tablet Take 0.5 tablets (50 mg total) by mouth 2 (two) times daily. 04/10/21   [name], MD  magnesium oxide (MAG-OX) 400 MG tablet Take 400 mg by mouth at bedtime.    [provider]  metoprolol succinate (TOPROL-XL) 25 MG 24 hr tablet Take 1 tablet (25 mg total) by mouth daily. 04/10/21   [name], MD  mirabegron ER (MYRBETRIQ) 25 MG TB24 tablet Take 25 mg by mouth daily. Patient not taking: Reported on 04/16/2021    [provider]  nitroGLYCERIN (NITROSTAT) 0.4 MG SL tablet Place 1  tablet (0.4 mg total) under the tongue every 5 (five) minutes as needed for chest pain. 08/29/20 08/29/21  [name] T, PA-C  nystatin (MYCOSTATIN/NYSTOP) powder Apply 1 application topically 3 (three) times daily. Patient not taking: No sig reported 03/08/21   Jaquita Folds, MD  omeprazole (PRILOSEC) 20 MG capsule Take 1 capsule (20 mg total) by mouth in the morning. 02/10/21   Annita Brod, MD  potassium chloride SA (KLOR-CON) 20 MEQ tablet Take 0.5 tablets (10 mEq total) by mouth daily. 09/14/20   [name] T, PA-C  pramipexole (MIRAPEX) 0.25 MG tablet TAKE 1 TABLET TWICE DAILY 03/14/21   Mosie Lukes, MD  simvastatin (ZOCOR) 20 MG tablet TAKE 1 TABLET AT BEDTIME 04/10/21   [name], MD  TRUEplus Lancets 33G MISC USE TO CHECK BLOOD SUGAR ONCE DAILY AND AS NEEDED.  DX CODE: E11.9 07/27/20   Mosie Lukes, MD  Zinc 50 MG TABS Take 50 mg by mouth daily.    [provider]    Allergies    Baclofen, Nitrofurantoin, and Tramadol  Review of Systems   Review of Systems  Constitutional:  Positive for appetite change. Negative for chills and fever.  HENT:  Negative for ear pain and sore throat.   Eyes:  Negative for pain and visual disturbance.  Respiratory:  Negative for cough, chest tightness and shortness of breath.   Cardiovascular:  Negative for chest pain and palpitations.  Gastrointestinal:  Positive for abdominal pain and nausea. Negative for constipation, diarrhea, hematochezia, melena and vomiting.  Genitourinary:  Negative for dysuria, flank pain, hematuria and pelvic pain.  Musculoskeletal:  Negative for arthralgias, back pain, joint swelling and myalgias.  Skin:  Negative for color change and rash.  Neurological:  Negative for dizziness, seizures, syncope, weakness, light-headedness, numbness and headaches.  All other systems reviewed and are negative.  Physical Exam Updated Vital Signs BP (!) 145/79 (BP Location: Right Arm)   Pulse (!) 58   Temp  98.3 F (36.8 C) (Oral)   Resp 20   Ht _0  (1.549 m)   Wt 66.7 kg   LMP  (LMP Unknown)   SpO2 98%   BMI 27.78 kg/m   Physical Exam Vitals and nursing note reviewed.  Constitutional:      General: She is not in acute distress.    Appearance: She is well-developed. She is not ill-appearing, toxic-appearing or diaphoretic.  HENT:     Head: Normocephalic and atraumatic.     Mouth/Throat:     Mouth: Mucous membranes are moist.     Pharynx: Oropharynx is clear.  Eyes:     Extraocular  Movements: Extraocular movements intact.     Conjunctiva/sclera: Conjunctivae normal.  Cardiovascular:     Rate and Rhythm: Normal rate and regular rhythm.     Heart sounds: No murmur heard. Pulmonary:     Effort: Pulmonary effort is normal. No respiratory distress.     Breath sounds: Normal breath sounds.  Abdominal:     Palpations: Abdomen is soft.     Tenderness: There is abdominal tenderness in the right lower quadrant. There is no right CVA tenderness, left CVA tenderness, guarding or rebound.  Musculoskeletal:     Cervical back: Neck supple.  Skin:    General: Skin is warm and dry.     Coloration: Skin is not jaundiced or pale.  Neurological:     General: No focal deficit present.     Mental Status: She is alert and oriented to person, place, and time.     Cranial Nerves: No cranial nerve deficit.     Motor: No weakness.  Psychiatric:        Mood and Affect: Mood normal.        Behavior: Behavior normal.    ED Results / Procedures / Treatments   Labs (all labs ordered are listed, but only abnormal results are displayed) Labs Reviewed  COMPREHENSIVE METABOLIC PANEL - Abnormal; Notable for the following components:      Result Value   Glucose, Bld 129 (*)    All other components within normal limits  URINALYSIS, ROUTINE W REFLEX MICROSCOPIC - Abnormal; Notable for the following components:   Color, Urine COLORLESS (*)    All other components within normal limits  CBC WITH  DIFFERENTIAL/PLATELET    EKG None  Radiology CT Abdomen Pelvis W Contrast  Result Date: 04/15/2021 CLINICAL DATA:  Bowel obstructed cecectomy. Upper abdominal pain today. EXAM: CT ABDOMEN AND PELVIS WITH CONTRAST TECHNIQUE: Multidetector CT imaging of the abdomen and pelvis was performed using the standard protocol following bolus administration of intravenous contrast. CONTRAST:  20m OMNIPAQUE IOHEXOL 350 MG/ML SOLN COMPARISON:  04/14/2021 FINDINGS: Lower chest: Calcified granuloma in the right lung. Large esophageal hiatal hernia. Wall thickening in the visualized distal esophagus likely representing reflux disease. Coronary artery calcifications. Hepatobiliary: No focal liver abnormality is seen. Status post cholecystectomy. No biliary dilatation. Pancreas: Unremarkable. No pancreatic ductal dilatation or surrounding inflammatory changes. Spleen: Normal in size without focal abnormality. Adrenals/Urinary Tract: No adrenal gland nodules. Renal nephrograms are symmetrical and homogeneous. Bilateral renal parenchymal lobulation or scarring. Cyst on the right kidney. No hydronephrosis or hydroureter. Bladder is filled with contrast material. No bladder wall thickening. Gas in the bladder may arise from instrumentation or cystitis. Stomach/Bowel: Stomach is fluid-filled with mild distention. There is a left inguinal hernia containing small bowel. Proximal to the hernia, there is dilatation of fluid-filled small bowel with decompression of the bowel distal to the hernia, consistent with small bowel obstruction. This is developing since the previous study. Colon is not abnormally distended. Scattered stool throughout the colon. Sigmoid colonic diverticulosis without evidence of acute diverticulitis. Appendix is normal. Vascular/Lymphatic: Diffuse calcification of the aorta. No aneurysm. No significant lymphadenopathy. Reproductive: Uterus is surgically absent. No abnormal pelvic masses. Other: No free air or  free fluid in the abdomen. Multiple anterior abdominal wall hernias containing fat. These are likely postoperative. Musculoskeletal: Degenerative changes in the spine. No destructive bone lesions. IMPRESSION: 1. Left inguinal hernia containing small bowel. Since the previous study, there is interval development of small bowel obstruction at the level of the hernia. Distal  small bowel are decompressed. 2. Large esophageal hiatal hernia. Wall thickening of the distal esophagus suggesting reflux disease. 3. Diffuse aortic atherosclerosis. 4. Diverticulosis of the sigmoid colon without evidence of acute diverticulitis. 5. Gas in the bladder could indicate instrumentation or infection. 6. Anterior abdominal wall hernias containing fat, likely postsurgical. Electronically Signed   By: Lucienne Capers M.D.   On: 04/15/2021 23:18   CT ABDOMEN PELVIS W CONTRAST  Result Date: 04/14/2021 CLINICAL DATA:  Abdominal distention, acute abdominal pain. Lower abdominal pain starting today. EXAM: CT ABDOMEN AND PELVIS WITH CONTRAST TECHNIQUE: Multidetector CT imaging of the abdomen and pelvis was performed using the standard protocol following bolus administration of intravenous contrast. CONTRAST:  133m OMNIPAQUE IOHEXOL 300 MG/ML  SOLN COMPARISON:  02/04/2021 FINDINGS: Lower chest: Calcified granuloma in the right lower lung. Mild dependent changes in the lung bases. Moderate esophageal hiatal hernia. Hepatobiliary: Surgical absence of the gallbladder. No bile duct dilatation. Liver is unremarkable. Pancreas: Unremarkable. No pancreatic ductal dilatation or surrounding inflammatory changes. Spleen: Normal in size without focal abnormality. Adrenals/Urinary Tract: No adrenal gland nodules. Bilateral renal scarring versus fetal lobulation. Bilateral renal cysts appearing benign. No hydronephrosis or hydroureter. Bladder wall is not thickened. There is gas in the bladder which could come from instrumentation or infection.  Stomach/Bowel: Stomach, small bowel, and colon are not abnormally distended. Scattered diverticula mostly in the sigmoid colon. No inflammatory changes to suggest acute diverticulitis. Appendix is normal. Vascular/Lymphatic: Aortic atherosclerosis. No enlarged abdominal or pelvic lymph nodes. Reproductive: Uterus is surgically absent.  No abnormal pelvic mass. Other: There is a moderate left inguinal hernia containing a loop of small bowel. No proximal obstruction or wall thickening. Minimal periumbilical hernia containing fat. Scarring along the anterior abdominal wall is likely postoperative. Musculoskeletal: Degenerative changes in the spine. No destructive bone lesions. IMPRESSION: 1. Left inguinal hernia containing small bowel. No proximal obstruction. 2. Diverticulosis of the sigmoid colon without evidence of diverticulitis. 3. Moderate esophageal hiatal hernia. 4. Gas within the bladder may result from instrumentation or infection. 5. Aortic atherosclerosis. Electronically Signed   By: WLucienne CapersM.D.   On: 04/14/2021 22:12   DG Abd Acute W/Chest  Result Date: 04/15/2021 CLINICAL DATA:  Upper abdominal pain with nausea and vomiting. EXAM: DG ABDOMEN ACUTE WITH 1 VIEW CHEST COMPARISON:  None. FINDINGS: Small hiatal hernia with air-fluid level. Lungs are clear. No pleural effusion. No free intraperitoneal air. No dilated loops of bowel. IMPRESSION: 1. Small hiatal hernia. No acute cardiopulmonary disease. 2. Nonobstructive bowel gas pattern. Electronically Signed   By: KUlyses JarredM.D.   On: 04/15/2021 22:36    Procedures Procedures   Medications Ordered in ED Medications  iohexol (OMNIPAQUE) 300 MG/ML solution 100 mL (100 mLs Intravenous Contrast Given 04/14/21 2156)  lactated ringers bolus 500 mL (0 mLs Intravenous Stopped 04/14/21 2303)  morphine 4 MG/ML injection 4 mg (4 mg Intravenous Given 04/14/21 2213)  ondansetron (ZOFRAN) injection 4 mg (4 mg Intravenous Given 04/14/21 2213)   metoCLOPramide (REGLAN) injection 10 mg (10 mg Intravenous Given 04/15/21 0139)  dicyclomine (BENTYL) capsule 10 mg (10 mg Oral Given 04/15/21 0139)    ED Course  I have reviewed the triage vital signs and the nursing notes.  Pertinent labs & imaging results that were available during my care of the patient were reviewed by me and considered in my medical decision making (see chart for details).    MDM Rules/Calculators/A&P  Patient presents for 1 day of right lower quadrant abdominal pain.  She is afebrile with normal vital signs upon arrival.  On exam, she does have tenderness in this area.  There is no guarding or rebound.  She does not have other areas of abdominal tenderness.  In the midline of her abdomen is a well-healed scar from previous surgery.  Given her decreased p.o. intake today, bolus of IV fluids was ordered.  This was done gently given her history of heart failure.  Patient was given morphine for analgesia.  She denies any current nausea.  Prior to being bedded in the ED, lab work was obtained.  Results showed normal electrolytes, no urine infection, and no leukocytosis.  Given her pain and tenderness, CT scan of abdomen pelvis was ordered.  Results showed no findings to explain her right lower quadrant pain.  In the left inguinal region, she does have a hernia without evidence of obstruction.  She has diverticulosis without diverticulitis.  She has a hiatal hernia.  No acute findings identified in the region of her pain and discomfort.  On reassessment, patient reported that pain was improved but still slightly present.  She was informed of reassuring CT scan results.  At this point, she was given some cranberry juice cocktail for p.o. challenge.  Care of patient was signed out to oncoming ED provider.  Final Clinical Impression(s) / ED Diagnoses Final diagnoses:  Generalized abdominal pain    Rx / DC Orders ED Discharge Orders          Ordered     dicyclomine (BENTYL) 20 MG tablet  2 times daily PRN        04/15/21 0234    ondansetron (ZOFRAN ODT) 4 MG disintegrating tablet  Every 8 hours PRN        04/15/21 0234             Godfrey Pick, MD 04/16/21 1350

## 2021-04-15 ENCOUNTER — Encounter (HOSPITAL_COMMUNITY): Payer: Self-pay

## 2021-04-15 ENCOUNTER — Other Ambulatory Visit: Payer: Self-pay

## 2021-04-15 ENCOUNTER — Emergency Department (HOSPITAL_COMMUNITY): Payer: Medicare HMO

## 2021-04-15 ENCOUNTER — Inpatient Hospital Stay (HOSPITAL_COMMUNITY)
Admission: EM | Admit: 2021-04-15 | Discharge: 2021-04-18 | DRG: 351 | Disposition: A | Discharge status: Home / Self Care | Payer: Medicare HMO | Attending: Internal Medicine | Admitting: Internal Medicine

## 2021-04-15 DIAGNOSIS — I452 Bifascicular block: Secondary | ICD-10-CM | POA: Diagnosis present

## 2021-04-15 DIAGNOSIS — K56609 Unspecified intestinal obstruction, unspecified as to partial versus complete obstruction: Secondary | ICD-10-CM | POA: Diagnosis not present

## 2021-04-15 DIAGNOSIS — N3281 Overactive bladder: Secondary | ICD-10-CM | POA: Diagnosis present

## 2021-04-15 DIAGNOSIS — I110 Hypertensive heart disease with heart failure: Secondary | ICD-10-CM | POA: Diagnosis present

## 2021-04-15 DIAGNOSIS — I5030 Unspecified diastolic (congestive) heart failure: Secondary | ICD-10-CM | POA: Diagnosis present

## 2021-04-15 DIAGNOSIS — Z8711 Personal history of peptic ulcer disease: Secondary | ICD-10-CM

## 2021-04-15 DIAGNOSIS — G250 Essential tremor: Secondary | ICD-10-CM | POA: Diagnosis present

## 2021-04-15 DIAGNOSIS — N390 Urinary tract infection, site not specified: Secondary | ICD-10-CM | POA: Diagnosis present

## 2021-04-15 DIAGNOSIS — Z8541 Personal history of malignant neoplasm of cervix uteri: Secondary | ICD-10-CM

## 2021-04-15 DIAGNOSIS — Z96651 Presence of right artificial knee joint: Secondary | ICD-10-CM | POA: Diagnosis present

## 2021-04-15 DIAGNOSIS — Z8249 Family history of ischemic heart disease and other diseases of the circulatory system: Secondary | ICD-10-CM

## 2021-04-15 DIAGNOSIS — Z23 Encounter for immunization: Secondary | ICD-10-CM

## 2021-04-15 DIAGNOSIS — Z823 Family history of stroke: Secondary | ICD-10-CM

## 2021-04-15 DIAGNOSIS — Z885 Allergy status to narcotic agent status: Secondary | ICD-10-CM

## 2021-04-15 DIAGNOSIS — Z818 Family history of other mental and behavioral disorders: Secondary | ICD-10-CM

## 2021-04-15 DIAGNOSIS — M810 Age-related osteoporosis without current pathological fracture: Secondary | ICD-10-CM | POA: Diagnosis present

## 2021-04-15 DIAGNOSIS — J8410 Pulmonary fibrosis, unspecified: Secondary | ICD-10-CM | POA: Diagnosis present

## 2021-04-15 DIAGNOSIS — G2581 Restless legs syndrome: Secondary | ICD-10-CM | POA: Diagnosis present

## 2021-04-15 DIAGNOSIS — Z888 Allergy status to other drugs, medicaments and biological substances status: Secondary | ICD-10-CM

## 2021-04-15 DIAGNOSIS — R1030 Lower abdominal pain, unspecified: Secondary | ICD-10-CM | POA: Diagnosis not present

## 2021-04-15 DIAGNOSIS — M1990 Unspecified osteoarthritis, unspecified site: Secondary | ICD-10-CM | POA: Diagnosis present

## 2021-04-15 DIAGNOSIS — I10 Essential (primary) hypertension: Secondary | ICD-10-CM | POA: Diagnosis present

## 2021-04-15 DIAGNOSIS — I2510 Atherosclerotic heart disease of native coronary artery without angina pectoris: Secondary | ICD-10-CM | POA: Diagnosis present

## 2021-04-15 DIAGNOSIS — Z86718 Personal history of other venous thrombosis and embolism: Secondary | ICD-10-CM

## 2021-04-15 DIAGNOSIS — Z833 Family history of diabetes mellitus: Secondary | ICD-10-CM

## 2021-04-15 DIAGNOSIS — K4030 Unilateral inguinal hernia, with obstruction, without gangrene, not specified as recurrent: Secondary | ICD-10-CM | POA: Diagnosis not present

## 2021-04-15 DIAGNOSIS — Z20822 Contact with and (suspected) exposure to covid-19: Secondary | ICD-10-CM | POA: Diagnosis present

## 2021-04-15 DIAGNOSIS — E1169 Type 2 diabetes mellitus with other specified complication: Secondary | ICD-10-CM | POA: Diagnosis present

## 2021-04-15 DIAGNOSIS — E1165 Type 2 diabetes mellitus with hyperglycemia: Secondary | ICD-10-CM | POA: Diagnosis present

## 2021-04-15 DIAGNOSIS — E119 Type 2 diabetes mellitus without complications: Secondary | ICD-10-CM

## 2021-04-15 DIAGNOSIS — I700 Atherosclerosis of aorta: Secondary | ICD-10-CM | POA: Diagnosis present

## 2021-04-15 DIAGNOSIS — E1121 Type 2 diabetes mellitus with diabetic nephropathy: Secondary | ICD-10-CM

## 2021-04-15 DIAGNOSIS — Z8052 Family history of malignant neoplasm of bladder: Secondary | ICD-10-CM

## 2021-04-15 DIAGNOSIS — Z79899 Other long term (current) drug therapy: Secondary | ICD-10-CM

## 2021-04-15 DIAGNOSIS — I5032 Chronic diastolic (congestive) heart failure: Secondary | ICD-10-CM | POA: Diagnosis present

## 2021-04-15 DIAGNOSIS — K219 Gastro-esophageal reflux disease without esophagitis: Secondary | ICD-10-CM

## 2021-04-15 DIAGNOSIS — Z8673 Personal history of transient ischemic attack (TIA), and cerebral infarction without residual deficits: Secondary | ICD-10-CM

## 2021-04-15 DIAGNOSIS — R339 Retention of urine, unspecified: Secondary | ICD-10-CM | POA: Diagnosis present

## 2021-04-15 DIAGNOSIS — Z7982 Long term (current) use of aspirin: Secondary | ICD-10-CM

## 2021-04-15 DIAGNOSIS — I252 Old myocardial infarction: Secondary | ICD-10-CM

## 2021-04-15 DIAGNOSIS — G43909 Migraine, unspecified, not intractable, without status migrainosus: Secondary | ICD-10-CM | POA: Diagnosis present

## 2021-04-15 DIAGNOSIS — Z955 Presence of coronary angioplasty implant and graft: Secondary | ICD-10-CM

## 2021-04-15 DIAGNOSIS — E782 Mixed hyperlipidemia: Secondary | ICD-10-CM | POA: Diagnosis present

## 2021-04-15 DIAGNOSIS — E785 Hyperlipidemia, unspecified: Secondary | ICD-10-CM | POA: Diagnosis present

## 2021-04-15 DIAGNOSIS — K449 Diaphragmatic hernia without obstruction or gangrene: Secondary | ICD-10-CM

## 2021-04-15 DIAGNOSIS — K5730 Diverticulosis of large intestine without perforation or abscess without bleeding: Secondary | ICD-10-CM | POA: Diagnosis present

## 2021-04-15 LAB — COMPREHENSIVE METABOLIC PANEL
ALT: 20 U/L (ref 0–44)
AST: 21 U/L (ref 15–41)
Albumin: 4.5 g/dL (ref 3.5–5.0)
Alkaline Phosphatase: 108 U/L (ref 38–126)
Anion gap: 9 (ref 5–15)
BUN: 13 mg/dL (ref 8–23)
CO2: 28 mmol/L (ref 22–32)
Calcium: 9.8 mg/dL (ref 8.9–10.3)
Chloride: 100 mmol/L (ref 98–111)
Creatinine, Ser: 0.97 mg/dL (ref 0.44–1.00)
GFR, Estimated: 58 mL/min — ABNORMAL LOW (ref >60)
Glucose, Bld: 147 mg/dL — ABNORMAL HIGH (ref 70–99)
Potassium: 4.2 mmol/L (ref 3.5–5.1)
Sodium: 137 mmol/L (ref 135–145)
Total Bilirubin: 0.6 mg/dL (ref 0.3–1.2)
Total Protein: 7.2 g/dL (ref 6.5–8.1)

## 2021-04-15 LAB — CBC WITH DIFFERENTIAL/PLATELET
Abs Immature Granulocytes: 0.02 10*3/uL (ref 0.00–0.07)
Basophils Absolute: 0 10*3/uL (ref 0.0–0.1)
Basophils Relative: 0 %
Eosinophils Absolute: 0.2 10*3/uL (ref 0.0–0.5)
Eosinophils Relative: 3 %
HCT: 43.3 % (ref 36.0–46.0)
Hemoglobin: 14.9 g/dL (ref 12.0–15.0)
Immature Granulocytes: 0 %
Lymphocytes Relative: 16 %
Lymphs Abs: 1.2 10*3/uL (ref 0.7–4.0)
MCH: 31.2 pg (ref 26.0–34.0)
MCHC: 34.4 g/dL (ref 30.0–36.0)
MCV: 90.6 fL (ref 80.0–100.0)
Monocytes Absolute: 0.7 10*3/uL (ref 0.1–1.0)
Monocytes Relative: 9 %
Neutro Abs: 5.5 10*3/uL (ref 1.7–7.7)
Neutrophils Relative %: 72 %
Platelets: 192 10*3/uL (ref 150–400)
RBC: 4.78 MIL/uL (ref 3.87–5.11)
RDW: 13 % (ref 11.5–15.5)
WBC: 7.6 10*3/uL (ref 4.0–10.5)
nRBC: 0 % (ref 0.0–0.2)

## 2021-04-15 LAB — URINALYSIS, ROUTINE W REFLEX MICROSCOPIC
Bacteria, UA: NONE SEEN
Bilirubin Urine: NEGATIVE
Glucose, UA: NEGATIVE mg/dL
Hgb urine dipstick: NEGATIVE
Ketones, ur: NEGATIVE mg/dL
Leukocytes,Ua: NEGATIVE
Nitrite: NEGATIVE
Protein, ur: 30 mg/dL — AB
Specific Gravity, Urine: 1.026 (ref 1.005–1.030)
pH: 7 (ref 5.0–8.0)

## 2021-04-15 LAB — LACTIC ACID, PLASMA: Lactic Acid, Venous: 1.3 mmol/L (ref 0.5–1.9)

## 2021-04-15 LAB — LIPASE, BLOOD: Lipase: 41 U/L (ref 11–51)

## 2021-04-15 MED ORDER — DICYCLOMINE HCL 20 MG PO TABS: 20.0000 mg | ORAL_TABLET | Freq: Two times a day (BID) | ORAL | 0 refills | Status: DC | PRN

## 2021-04-15 MED ORDER — METOCLOPRAMIDE HCL 5 MG/ML IJ SOLN
10.0000 mg | Freq: Once | INTRAMUSCULAR | Status: AC
  Administered 2021-04-15: 10 mg via INTRAVENOUS
  Filled 2021-04-15: qty 2

## 2021-04-15 MED ORDER — MORPHINE SULFATE (PF) 2 MG/ML IV SOLN
2.0000 mg | Freq: Once | INTRAVENOUS | Status: AC
  Administered 2021-04-15: 2 mg via INTRAVENOUS
  Filled 2021-04-15: qty 1

## 2021-04-15 MED ORDER — MIDAZOLAM HCL 2 MG/2ML IJ SOLN
2.0000 mg | Freq: Once | INTRAMUSCULAR | Status: AC
  Administered 2021-04-15: 2 mg via INTRAVENOUS
  Filled 2021-04-15: qty 2

## 2021-04-15 MED ORDER — ONDANSETRON 4 MG PO TBDP: 4.0000 mg | ORAL_TABLET | Freq: Three times a day (TID) | ORAL | 0 refills | Status: DC | PRN

## 2021-04-15 MED ORDER — ONDANSETRON HCL 4 MG/2ML IJ SOLN
4.0000 mg | Freq: Once | INTRAMUSCULAR | Status: AC
  Administered 2021-04-15: 4 mg via INTRAVENOUS
  Filled 2021-04-15: qty 2

## 2021-04-15 MED ORDER — SODIUM CHLORIDE 0.9 % IV BOLUS
1000.0000 mL | Freq: Once | INTRAVENOUS | Status: AC
Start: 2021-04-15 — End: 2021-04-16
  Administered 2021-04-15: 1000 mL via INTRAVENOUS

## 2021-04-15 MED ORDER — DICYCLOMINE HCL 10 MG PO CAPS
10.0000 mg | ORAL_CAPSULE | Freq: Once | ORAL | Status: AC
  Administered 2021-04-15: 10 mg via ORAL
  Filled 2021-04-15: qty 1

## 2021-04-15 MED ORDER — IOHEXOL 350 MG/ML SOLN
80.0000 mL | Freq: Once | INTRAVENOUS | Status: AC | PRN
  Administered 2021-04-15: 80 mL via INTRAVENOUS

## 2021-04-15 NOTE — ED Provider Notes (Signed)
Emergency Medicine Provider Triage Evaluation Note  Heather [name] , a 86 y.o. female  was evaluated in triage.  Pt complains of abd pain.  Review of Systems  Positive: Abd pain, nausea, vomiting Negative: Fever, dysuria, sob, cp  Physical Exam  BP (!) 181/73 (BP Location: Right Arm)   Pulse 65   Temp 97.8 F (36.6 C) (Oral)   Resp 18   LMP  (LMP Unknown)   SpO2 97%  Gen:   Awake, appears in pain Resp:  Normal effort  MSK:   Moves extremities without difficulty  Other:  TTP epigastric and RUQ  Medical Decision Making  Medically screening exam initiated at 12:41 PM.  Appropriate orders placed.  AXIE HAYNE was informed that the remainder of the evaluation will be completed by another provider, this initial triage assessment does not replace that evaluation, and the importance of remaining in the ED until their evaluation is complete.  Progressive worsening abd pain x 2 days.  Seen at [facility] yesterday, had CT scan, d/c home but now having worsening pain.   PACS Intelerad Image Link   Show images for CT ABDOMEN PELVIS W CONTRAST Study Result  Narrative & Impression  CLINICAL DATA:  Abdominal distention, acute abdominal pain. Lower abdominal pain starting today.   EXAM: CT ABDOMEN AND PELVIS WITH CONTRAST   TECHNIQUE: Multidetector CT imaging of the abdomen and pelvis was performed using the standard protocol following bolus administration of intravenous contrast.   CONTRAST:  117mL OMNIPAQUE IOHEXOL 300 MG/ML  SOLN   COMPARISON:  02/04/2021   FINDINGS: Lower chest: Calcified granuloma in the right lower lung. Mild dependent changes in the lung bases. Moderate esophageal hiatal hernia.   Hepatobiliary: Surgical absence of the gallbladder. No bile duct dilatation. Liver is unremarkable.   Pancreas: Unremarkable. No pancreatic ductal dilatation or surrounding inflammatory changes.   Spleen: Normal in size without focal abnormality.   Adrenals/Urinary  Tract: No adrenal gland nodules. Bilateral renal scarring versus fetal lobulation. Bilateral renal cysts appearing benign. No hydronephrosis or hydroureter. Bladder wall is not thickened. There is gas in the bladder which could come from instrumentation or infection.   Stomach/Bowel: Stomach, small bowel, and colon are not abnormally distended. Scattered diverticula mostly in the sigmoid colon. No inflammatory changes to suggest acute diverticulitis. Appendix is normal.   Vascular/Lymphatic: Aortic atherosclerosis. No enlarged abdominal or pelvic lymph nodes.   Reproductive: Uterus is surgically absent.  No abnormal pelvic mass.   Other: There is a moderate left inguinal hernia containing a loop of small bowel. No proximal obstruction or wall thickening. Minimal periumbilical hernia containing fat. Scarring along the anterior abdominal wall is likely postoperative.   Musculoskeletal: Degenerative changes in the spine. No destructive bone lesions.   IMPRESSION: 1. Left inguinal hernia containing small bowel. No proximal obstruction. 2. Diverticulosis of the sigmoid colon without evidence of diverticulitis. 3. Moderate esophageal hiatal hernia. 4. Gas within the bladder may result from instrumentation or infection. 5. Aortic atherosclerosis.     Domenic Moras, PA-C 04/15/21 1243    Horton, Alvin Critchley, DO 04/15/21 1609

## 2021-04-15 NOTE — ED Provider Notes (Signed)
1:18 AM Assumed care from Dr. Doren Custard, please see their note for full history, physical and decision making until this point. In brief this is a 86 y.o. year old female who presented to the ED tonight with Abdominal Pain and Nausea     Symptoms improved. Requesting discharge.   Discharge instructions, including strict return precautions for new or worsening symptoms, given. Patient and/or family verbalized understanding and agreement with the plan as described.   Labs, studies and imaging reviewed by myself and considered in medical decision making if ordered. Imaging interpreted by radiology.  Labs Reviewed  COMPREHENSIVE METABOLIC PANEL - Abnormal; Notable for the following components:      Result Value   Glucose, Bld 129 (*)    All other components within normal limits  URINALYSIS, ROUTINE W REFLEX MICROSCOPIC - Abnormal; Notable for the following components:   Color, Urine COLORLESS (*)    All other components within normal limits  CBC WITH DIFFERENTIAL/PLATELET    CT ABDOMEN PELVIS W CONTRAST  Final Result      No follow-ups on file.    Normajean Nash, Corene Cornea, MD 04/15/21 [phone]

## 2021-04-15 NOTE — ED Notes (Signed)
PO challenge successful. Denies N/V

## 2021-04-15 NOTE — ED Triage Notes (Signed)
Patient c/o upper abdominal pain today and states she went to [facility] yesterday with c/o lower abdominal pain. Patient reports a history of diverticulitis. Patient also reports N/V today.

## 2021-04-15 NOTE — ED Provider Notes (Addendum)
[facility] DEPT Provider Note   CSN: [id number] Arrival date & time: 04/15/21  1230     History Chief Complaint  Patient presents with   Abdominal Pain   Emesis    Heather [name] is a 86 y.o. female.  Patient presents to the ER with recurrent abdominal pain.  She was seen in the ER yesterday with lower abdominal pain, however today states that it is more epigastric in nature sharp and persistent associate with vomiting all day greenish vomitus.  Denies fevers denies cough denies diarrhea.      Past Medical History:  Diagnosis Date   Chronic stable angina [facility])    Coronary artery disease cardiologist--- dr Burt Knack   hx NSTEMI  w/ cardiac cath 03-16-2005 PCI with DES to LCx;   cardiac cath 06-27-2005  PCI with DES to RCA with residual dx LAD manage medcially;  lexiscan 01-26-20211 normal no ischemia, ef 70%;  stress echo w/ dobutamine 10-24-2011 negative ishcmeia , normal ef // Myoview 2/22: EF 67, no ischemia or infarction, no TID, low risk    Diabetes mellitus type 2, diet-controlled ([place])    followed by pcp  (10-19-2020  pt stated checks daily in am,  fasting blood surgar--- 115--120s)   DOE (dyspnea on exertion)    per pt "when I over do",  ok with household chores   Echocardiogram 08/2020    Echocardiogram 2/22: EF 55-60, no RWMA, mild LVH, Gr 2 DD, GLS-21.7%, normal RVSF, trivial MR, RVSP 39.5   Edema of right lower extremity    GERD (gastroesophageal reflux disease)    Hiatal hernia    recurrence,  hx HH repair 1989   History of cervical cancer    s/p  vaginal hysterectomy   History of DVT of lower extremity 2016   11-29-2014 post op right TKA of right lower extremity and completed xarelto    History of esophageal stricture    hx s/p dilatation's   History of gastric ulcer 2005 approx.   History of palpitations 2010   event monitor 07-07-2009 showed NSR w/ freq. SVT ectopies with short runs, rare PVCs   History of TIA (transient ischemic  attack) 06/1999   12-15-2019  per pt had several TIA between 12/ 2000 to 02/ 2001 , was sent to specialist _0 , had test that was normal (10-19-2020 pt stated no TIAs since ) but has residual of essential tremors of right arm/ hand   Hypertension    Intermittent palpitations    IT band syndrome    Migraine    "ice pick headche lasts about 30 seconds"   Mixed hyperlipidemia    Mixed incontinence urge and stress    Multiple thyroid nodules    followed by pcp---   ultrasound 11-22-2014 no bx   (12-15-2019 per pt had a endocrinologist and was told did not need bx)   OA (osteoarthritis)    knees, elbow, hip, ankles   Occasional tremors    right arm/ hand  s/p TIA residual 2000   Osteoporosis    taking vitamin d   Peroneal DVT (deep venous thrombosis) ([place]) 12/22/2014   Right bundle branch block (RBBB) with left anterior fascicular block (LAFB)    RLS (restless legs syndrome)    S/P drug eluting coronary stent placement 2006   03-16-2005  PCI x1 DES to LCx;   06-27-2005  PCI x1 DES to RCA   Urinary retention    post op sling prodecure on 10-27-2020, has foley cathether  Patient Active Problem List   Diagnosis Date Noted   Dysphagia 02/16/2021   Tremor 02/16/2021   Sepsis ([place]) 02/05/2021   AKI (acute kidney injury) ([place]) 02/05/2021   Chronic diastolic CHF (congestive heart failure) ([place]) 02/05/2021   UTI (urinary tract infection) 01/31/2021   Acute on chronic diastolic CHF (congestive heart failure) ([place]) 01/28/2021   Intra-abdominal fluid collection 01/28/2021   Incarcerated incisional hernia s/p primary repair 01/25/2021 01/25/2021   Stress reaction of bone 01/05/2021   Pain of right sternoclavicular joint 01/05/2021   Screening for osteoporosis 01/05/2021   RUQ pain 12/01/2020   Chronic calculous cholecystitis s/p lap cholecystectomy 01/25/2021 12/01/2020   Low back pain radiating to right leg 07/14/2020   Pelvic relaxation due to rectocele 04/06/2020   Prolapse of female  pelvic organs 12/22/2019   Greater trochanteric pain syndrome of right lower extremity 10/13/2019   Urinary incontinence, mixed 10/01/2019   Pelvic floor relaxation 06/08/2019   Deep venous thrombosis ([place]) 03/19/2019   Goiter 03/19/2019   Transient ischemic attack 03/19/2019   Acute dermatitis 03/19/2019   Pelvic prolapse 03/08/2019   Pain of breast 01/29/2019   Overactive bladder 01/29/2019   Educated about COVID-19 virus infection 12/02/2018   Hyperlipidemia associated with type 2 diabetes mellitus ([place]) 05/27/2017   Headache 04/17/2015   Multinodular goiter 01/17/2015   S/P total knee arthroplasty 11/29/2014   Insomnia 02/05/2014   Preventative health care 11/22/2013   RLS (restless legs syndrome) 10/04/2013   Lower urinary tract infectious disease 10/04/2013   Cataracts, bilateral 04/02/2013   Hypokalemia 01/05/2012   Anemia 01/05/2012   Mixed anxiety and depressive disorder 01/05/2012   Postoperative anemia due to acute blood loss 11/15/2011   Staphylococcus aureus carrier 11/15/2011   Pre-syncope 10/13/2011   DM (diabetes mellitus) ([place]) 10/06/2011   Pulmonary nodule 10/06/2011   Epigastric pain 10/06/2011   It band syndrome, right 08/28/2011   Renal insufficiency 08/28/2011   CORONARY ATHEROSCLEROSIS NATIVE CORONARY ARTERY 03/07/2010   Coronary atherosclerosis 01/18/2009   FATIGUE 01/18/2009   PERSISTENT DISORDER INITIATING/MAINTAINING SLEEP 09/09/2008   DERMATITIS 10/16/2007   PERIPHERAL EDEMA 10/16/2007   Essential hypertension 04/14/2007   Gastroesophageal reflux disease with hiatal hernia 04/14/2007    Past Surgical History:  Procedure Laterality Date   ANTERIOR AND POSTERIOR REPAIR N/A 12/22/2019   Procedure: ANTERIOR (CYSTOCELE)  REPAIR;  Surgeon: Janyth Contes, MD;  Location: [facility];  Service: Gynecology;  Laterality: N/A;   ANTERIOR AND POSTERIOR REPAIR WITH SACROSPINOUS FIXATION N/A 10/27/2020   Procedure: SACROSPINOUS LIGAMENT  FIXATION;  Surgeon: Jaquita Folds, MD;  Location[facility];  Service: Gynecology;  Laterality: N/A;   BLADDER SUSPENSION N/A 10/27/2020   Procedure: TRANSVAGINAL TAPE (TVT) PROCEDURE;  Surgeon: Jaquita Folds, MD;  Location: [facility];  Service: Gynecology;  Laterality: N/A;   CATARACT EXTRACTION W/ INTRAOCULAR LENS  IMPLANT, BILATERAL  2015   CHOLECYSTECTOMY N/A 01/25/2021   Procedure: LAPAROSCOPIC CHOLECYSTECTOMY WITH INTRAOPERATIVE CHOLANGIOGRAM AND LYSIS OF ADHESIONS;  Surgeon: Michael Boston, MD;  Location: [facility];  Service: General;  Laterality: N/A;   COLONOSCOPY  last one ?   CORONARY ANGIOPLASTY WITH STENT PLACEMENT  03-16-2005   dr Lia Foyer   PCI and DES x1 to LCx   CORONARY ANGIOPLASTY WITH STENT PLACEMENT  06-27-2005  dr Lia Foyer   PCI and DES x1 to RCA with residual disease LAD 70-80% to manage medically   CYSTOSCOPY N/A 10/27/2020   Procedure: CYSTOSCOPY;  Surgeon: Jaquita Folds, MD;  Location: [facility];  Service: Gynecology;  Laterality: N/A;   CYSTOSCOPY N/A 11/09/2020   Procedure: CYSTOSCOPY;  Surgeon: Jaquita Folds, MD;  Location: [facility];  Service: Gynecology;  Laterality: N/A;   FOOT SURGERY Left 1990s   left foot stress fracture repair, per pt no hardware   HIATAL HERNIA REPAIR  1989   INCISIONAL HERNIA REPAIR N/A 01/25/2021   Procedure: PRIMARY REPAIR OF INCISIONAL HERNIA;  Surgeon: Michael Boston, MD;  Location: [facility];  Service: General;  Laterality: N/A;   KNEE ARTHROSCOPY Bilateral right ?/   left x2 , last one 09-12-2009 @ [place]   PUBOVAGINAL SLING N/A 11/09/2020   Procedure: [place];  Surgeon: Jaquita Folds, MD;  Location[facility];  Service: Gynecology;  Laterality: N/A;   RECTOCELE REPAIR N/A 04/20/2020   Procedure: POSTERIOR REPAIR (RECTOCELE);  Surgeon: Janyth Contes, MD;  Location: [facility];   Service: Gynecology;  Laterality: N/A;   RECTOCELE REPAIR N/A 10/27/2020   Procedure: POSTERIOR REPAIR (RECTOCELE);  Surgeon: Jaquita Folds, MD;  Location: [facility];  Service: Gynecology;  Laterality: N/A;  total time requested for all procedures is 2 hours   TOTAL KNEE ARTHROPLASTY  11/12/2011   Procedure: TOTAL KNEE ARTHROPLASTY;  Surgeon: Lorn Junes, MD;  Location: [facility];  Service: Orthopedics;  Laterality: Left;  Dr Noemi Chapel wants 90 minutes for this case   TOTAL KNEE ARTHROPLASTY Right 11/29/2014   Procedure: RIGHT TOTAL KNEE ARTHROPLASTY;  Surgeon: Vickey Huger, MD;  Location: [facility];  Service: Orthopedics;  Laterality: Right;   UPPER GASTROINTESTINAL ENDOSCOPY  last one 04-25-2017   with dilatation esophageal stricture and savary dilatation   VAGINAL HYSTERECTOMY  1988    no ovaries removed for bleeeding     OB History     Gravida  4   Para  1   Term      Preterm  1   AB      Living  4      SAB      IAB      Ectopic      Multiple      Live Births              Family History  Problem Relation Age of Onset   Stroke Father        family hx of M 1st degree relative <50   Coronary artery disease Mother    Heart disease Mother    Depression Brother    Stroke Brother    Diabetes Brother    Cancer Brother        bladder with mets   Diabetes Daughter        borderline   Hypertension Daughter    Arthritis Other        family hx of   Hypertension Other        family hx of   Other Other        family hx of cardiovascular disorder   Thyroid disease Daughter    Breast cancer Neg Hx    Colon cancer Neg Hx    Anesthesia problems Neg Hx    Hypotension Neg Hx    Malignant hyperthermia Neg Hx    Pseudochol deficiency Neg Hx    Colon polyps Neg Hx    Esophageal cancer Neg Hx    Rectal cancer Neg Hx    Stomach cancer Neg Hx  Social History   Tobacco Use   Smoking status: Never   Smokeless tobacco: Never  Vaping Use    Vaping Use: Never used  Substance Use Topics   Alcohol use: No    Alcohol/week: 0.0 standard drinks   Drug use: Never    Home Medications Prior to Admission medications   Medication Sig Start Date End Date Taking? Authorizing Provider  acetaminophen (TYLENOL) 500 MG tablet Take 1,000 mg by mouth every 6 (six) hours as needed for moderate pain.    [provider]  amLODipine (NORVASC) 10 MG tablet TAKE 1 TABLET EVERY DAY 02/01/21   Mosie Lukes, MD  Ascorbic Acid (VITAMIN C) 500 MG CHEW Chew 500 mg by mouth daily.    [provider]  aspirin EC 81 MG tablet Take 1 tablet (81 mg total) by mouth daily. 05/04/15   [name], MD  Blood Glucose Monitoring Suppl (TRUE METRIX AIR GLUCOSE METER) w/Device KIT USE TO CHECK BLOOD SUGAR ONCE DAILY AND AS NEEDED.  DX CODE E11.9 07/27/20   Mosie Lukes, MD  cephALEXin (KEFLEX) 250 MG capsule Take 1 capsule (250 mg total) by mouth daily. 03/15/21   Jaquita Folds, MD  Cholecalciferol (VITAMIN D) 50 MCG (2000 UT) CAPS Take 2,000 Units by mouth daily.     [provider]  diclofenac Sodium (VOLTAREN) 1 % GEL Apply 1 application topically 4 (four) times daily as needed (hip pain).    [provider]  dicyclomine (BENTYL) 20 MG tablet Take 1 tablet (20 mg total) by mouth 2 (two) times daily as needed for spasms (abdominal cramping). 04/15/21   Mesner, Corene Cornea, MD  famotidine (PEPCID) 20 MG tablet Take 20 mg by mouth at bedtime as needed for heartburn or indigestion.    [provider]  furosemide (LASIX) 20 MG tablet Take 1 tablet (20 mg total) by mouth daily. 03/29/21   Supple, Megan E, RPH-CPP  glucose blood (TRUE METRIX BLOOD GLUCOSE TEST) test strip USE TO CHECK BLOOD SUGAR ONCE A DAY OR AS NEEDED.  DX CODE: E11.9 07/27/20   Mosie Lukes, MD  isosorbide mononitrate (IMDUR) 60 MG 24 hr tablet Take 1.5 tablets (90 mg total) by mouth daily. 12/14/20 12/14/21  [name] T, PA-C  ketorolac (TORADOL) 10 MG  tablet Take 1 tablet (10 mg total) by mouth every 6 (six) hours as needed. 10/30/20   Jaquita Folds, MD  losartan (COZAAR) 100 MG tablet Take 0.5 tablets (50 mg total) by mouth 2 (two) times daily. 04/10/21   [name], MD  magnesium oxide (MAG-OX) 400 MG tablet Take 400 mg by mouth at bedtime.    [provider]  metoprolol succinate (TOPROL-XL) 25 MG 24 hr tablet Take 1 tablet (25 mg total) by mouth daily. 04/10/21   [name], MD  nitroGLYCERIN (NITROSTAT) 0.4 MG SL tablet Place 1 tablet (0.4 mg total) under the tongue every 5 (five) minutes as needed for chest pain. 08/29/20 08/29/21  [name] T, PA-C  nystatin (MYCOSTATIN/NYSTOP) powder Apply 1 application topically 3 (three) times daily. 03/08/21   Jaquita Folds, MD  omeprazole (PRILOSEC) 20 MG capsule Take 1 capsule (20 mg total) by mouth in the morning. 02/10/21   Annita Brod, MD  ondansetron (ZOFRAN ODT) 4 MG disintegrating tablet Take 1 tablet (4 mg total) by mouth every 8 (eight) hours as needed. 20m ODT q4 hours prn nausea/vomit 04/15/21   Mesner, JCorene Cornea MD  potassium chloride SA (KLOR-CON) 20 MEQ  tablet Take 0.5 tablets (10 mEq total) by mouth daily. 09/14/20   [name] T, PA-C  pramipexole (MIRAPEX) 0.25 MG tablet TAKE 1 TABLET TWICE DAILY 03/14/21   Mosie Lukes, MD  simvastatin (ZOCOR) 20 MG tablet TAKE 1 TABLET AT BEDTIME 04/10/21   [name], MD  TRUEplus Lancets 33G MISC USE TO CHECK BLOOD SUGAR ONCE DAILY AND AS NEEDED.  DX CODE: E11.9 07/27/20   Mosie Lukes, MD  Zinc 50 MG TABS Take 50 mg by mouth daily.    [provider]    Allergies    Baclofen, Nitrofurantoin, and Tramadol  Review of Systems   Review of Systems  Constitutional:  Negative for fever.  HENT:  Negative for ear pain.   Eyes:  Negative for pain.  Respiratory:  Negative for cough.   Cardiovascular:  Negative for chest pain.  Gastrointestinal:  Positive for abdominal pain.  Genitourinary:   Negative for flank pain.  Musculoskeletal:  Negative for back pain.  Skin:  Negative for rash.  Neurological:  Negative for headaches.   Physical Exam Updated Vital Signs BP (!) 147/66   Pulse 65   Temp 97.8 F (36.6 C) (Oral)   Resp 18   Ht _0  (1.549 m)   Wt 66.7 kg   LMP  (LMP Unknown)   SpO2 97%   BMI 27.78 kg/m   Physical Exam Constitutional:      General: She is not in acute distress.    Appearance: Normal appearance.  HENT:     Head: Normocephalic.     Nose: Nose normal.  Eyes:     Extraocular Movements: Extraocular movements intact.  Cardiovascular:     Rate and Rhythm: Normal rate.  Pulmonary:     Effort: Pulmonary effort is normal.  Abdominal:     Comments: Moderate abdominal dilatation noted.  Diffuse abdominal tenderness noted no guarding or rebound noted.  Musculoskeletal:        General: Normal range of motion.     Cervical back: Normal range of motion.  Neurological:     General: No focal deficit present.     Mental Status: She is alert. Mental status is at baseline.    ED Results / Procedures / Treatments   Labs (all labs ordered are listed, but only abnormal results are displayed) Labs Reviewed  COMPREHENSIVE METABOLIC PANEL - Abnormal; Notable for the following components:      Result Value   Glucose, Bld 147 (*)    GFR, Estimated 58 (*)    All other components within normal limits  URINALYSIS, ROUTINE W REFLEX MICROSCOPIC - Abnormal; Notable for the following components:   APPearance HAZY (*)    Protein, ur 30 (*)    All other components within normal limits  CBC WITH DIFFERENTIAL/PLATELET  LIPASE, BLOOD  LACTIC ACID, PLASMA    EKG None  Radiology CT Abdomen Pelvis W Contrast  Result Date: 04/15/2021 CLINICAL DATA:  Bowel obstructed cecectomy. Upper abdominal pain today. EXAM: CT ABDOMEN AND PELVIS WITH CONTRAST TECHNIQUE: Multidetector CT imaging of the abdomen and pelvis was performed using the standard protocol following bolus  administration of intravenous contrast. CONTRAST:  67m OMNIPAQUE IOHEXOL 350 MG/ML SOLN COMPARISON:  04/14/2021 FINDINGS: Lower chest: Calcified granuloma in the right lung. Large esophageal hiatal hernia. Wall thickening in the visualized distal esophagus likely representing reflux disease. Coronary artery calcifications. Hepatobiliary: No focal liver abnormality is seen. Status post cholecystectomy. No biliary dilatation. Pancreas: Unremarkable. No pancreatic ductal dilatation or  surrounding inflammatory changes. Spleen: Normal in size without focal abnormality. Adrenals/Urinary Tract: No adrenal gland nodules. Renal nephrograms are symmetrical and homogeneous. Bilateral renal parenchymal lobulation or scarring. Cyst on the right kidney. No hydronephrosis or hydroureter. Bladder is filled with contrast material. No bladder wall thickening. Gas in the bladder may arise from instrumentation or cystitis. Stomach/Bowel: Stomach is fluid-filled with mild distention. There is a left inguinal hernia containing small bowel. Proximal to the hernia, there is dilatation of fluid-filled small bowel with decompression of the bowel distal to the hernia, consistent with small bowel obstruction. This is developing since the previous study. Colon is not abnormally distended. Scattered stool throughout the colon. Sigmoid colonic diverticulosis without evidence of acute diverticulitis. Appendix is normal. Vascular/Lymphatic: Diffuse calcification of the aorta. No aneurysm. No significant lymphadenopathy. Reproductive: Uterus is surgically absent. No abnormal pelvic masses. Other: No free air or free fluid in the abdomen. Multiple anterior abdominal wall hernias containing fat. These are likely postoperative. Musculoskeletal: Degenerative changes in the spine. No destructive bone lesions. IMPRESSION: 1. Left inguinal hernia containing small bowel. Since the previous study, there is interval development of small bowel obstruction at  the level of the hernia. Distal small bowel are decompressed. 2. Large esophageal hiatal hernia. Wall thickening of the distal esophagus suggesting reflux disease. 3. Diffuse aortic atherosclerosis. 4. Diverticulosis of the sigmoid colon without evidence of acute diverticulitis. 5. Gas in the bladder could indicate instrumentation or infection. 6. Anterior abdominal wall hernias containing fat, likely postsurgical. Electronically Signed   By: Lucienne Capers M.D.   On: 04/15/2021 23:18   CT ABDOMEN PELVIS W CONTRAST  Result Date: 04/14/2021 CLINICAL DATA:  Abdominal distention, acute abdominal pain. Lower abdominal pain starting today. EXAM: CT ABDOMEN AND PELVIS WITH CONTRAST TECHNIQUE: Multidetector CT imaging of the abdomen and pelvis was performed using the standard protocol following bolus administration of intravenous contrast. CONTRAST:  127m OMNIPAQUE IOHEXOL 300 MG/ML  SOLN COMPARISON:  02/04/2021 FINDINGS: Lower chest: Calcified granuloma in the right lower lung. Mild dependent changes in the lung bases. Moderate esophageal hiatal hernia. Hepatobiliary: Surgical absence of the gallbladder. No bile duct dilatation. Liver is unremarkable. Pancreas: Unremarkable. No pancreatic ductal dilatation or surrounding inflammatory changes. Spleen: Normal in size without focal abnormality. Adrenals/Urinary Tract: No adrenal gland nodules. Bilateral renal scarring versus fetal lobulation. Bilateral renal cysts appearing benign. No hydronephrosis or hydroureter. Bladder wall is not thickened. There is gas in the bladder which could come from instrumentation or infection. Stomach/Bowel: Stomach, small bowel, and colon are not abnormally distended. Scattered diverticula mostly in the sigmoid colon. No inflammatory changes to suggest acute diverticulitis. Appendix is normal. Vascular/Lymphatic: Aortic atherosclerosis. No enlarged abdominal or pelvic lymph nodes. Reproductive: Uterus is surgically absent.  No abnormal  pelvic mass. Other: There is a moderate left inguinal hernia containing a loop of small bowel. No proximal obstruction or wall thickening. Minimal periumbilical hernia containing fat. Scarring along the anterior abdominal wall is likely postoperative. Musculoskeletal: Degenerative changes in the spine. No destructive bone lesions. IMPRESSION: 1. Left inguinal hernia containing small bowel. No proximal obstruction. 2. Diverticulosis of the sigmoid colon without evidence of diverticulitis. 3. Moderate esophageal hiatal hernia. 4. Gas within the bladder may result from instrumentation or infection. 5. Aortic atherosclerosis. Electronically Signed   By: WLucienne CapersM.D.   On: 04/14/2021 22:12   DG Abd Acute W/Chest  Result Date: 04/15/2021 CLINICAL DATA:  Upper abdominal pain with nausea and vomiting. EXAM: DG ABDOMEN ACUTE WITH 1 VIEW CHEST COMPARISON:  None. FINDINGS: Small hiatal hernia with air-fluid level. Lungs are clear. No pleural effusion. No free intraperitoneal air. No dilated loops of bowel. IMPRESSION: 1. Small hiatal hernia. No acute cardiopulmonary disease. 2. Nonobstructive bowel gas pattern. Electronically Signed   By: Ulyses Jarred M.D.   On: 04/15/2021 22:36    Procedures NG placement  Date/Time: 04/15/2021 11:32 PM Performed by: Luna Fuse, MD Authorized by: Luna Fuse, MD  Consent: Verbal consent obtained. Consent given by: patient and guardian  Sedation: Patient sedated: yes Sedation type: anxiolysis Sedatives: midazolam  Patient tolerance: patient tolerated the procedure well with no immediate complications Comments: G-tube placed successfully.  Good output of at least 800 cc of greenish fluid.     Medications Ordered in ED Medications  ondansetron (ZOFRAN) injection 4 mg (4 mg Intravenous Given 04/15/21 2221)  sodium chloride 0.9 % bolus 1,000 mL (1,000 mLs Intravenous New Bag/Given 04/15/21 2221)  morphine 2 MG/ML injection 2 mg (2 mg Intravenous Given  04/15/21 2256)  iohexol (OMNIPAQUE) 350 MG/ML injection 80 mL (80 mLs Intravenous Contrast Given 04/15/21 2235)  midazolam (VERSED) injection 2 mg (2 mg Intravenous Given 04/15/21 2310)    ED Course  I have reviewed the triage vital signs and the nursing notes.  Pertinent labs & imaging results that were available during my care of the patient were reviewed by me and considered in my medical decision making (see chart for details).    MDM Rules/Calculators/A&P                           Labs unremarkable white count normal chemistry normal.  Vital signs within normal limits.  Patient appears very uncomfortable here given Zofran and IV fluid resuscitation.  Imaging concerning for bowel obstruction with dilated loops of bowel and dilated stomach.  NG tube placed for symptom management.  Copious amounts of green vomitus suctioned out.  Surgery consulted for small bowel obstruction with incarcerated left inguinal hernia.   Final Clinical Impression(s) / ED Diagnoses Final diagnoses:  Small bowel obstruction ([place])  Unilateral inguinal hernia with obstruction and without gangrene, recurrence not specified    Rx / DC Orders ED Discharge Orders     None        Luna Fuse, MD 04/15/21 [id number]    Luna Fuse, MD 04/15/21 [phone]

## 2021-04-16 DIAGNOSIS — K4030 Unilateral inguinal hernia, with obstruction, without gangrene, not specified as recurrent: Secondary | ICD-10-CM | POA: Diagnosis present

## 2021-04-16 DIAGNOSIS — K219 Gastro-esophageal reflux disease without esophagitis: Secondary | ICD-10-CM | POA: Diagnosis present

## 2021-04-16 DIAGNOSIS — E1169 Type 2 diabetes mellitus with other specified complication: Secondary | ICD-10-CM | POA: Diagnosis present

## 2021-04-16 DIAGNOSIS — K56609 Unspecified intestinal obstruction, unspecified as to partial versus complete obstruction: Secondary | ICD-10-CM | POA: Diagnosis present

## 2021-04-16 DIAGNOSIS — G2581 Restless legs syndrome: Secondary | ICD-10-CM | POA: Diagnosis present

## 2021-04-16 DIAGNOSIS — E1165 Type 2 diabetes mellitus with hyperglycemia: Secondary | ICD-10-CM | POA: Diagnosis present

## 2021-04-16 DIAGNOSIS — N390 Urinary tract infection, site not specified: Secondary | ICD-10-CM | POA: Diagnosis present

## 2021-04-16 DIAGNOSIS — G250 Essential tremor: Secondary | ICD-10-CM | POA: Diagnosis present

## 2021-04-16 DIAGNOSIS — E782 Mixed hyperlipidemia: Secondary | ICD-10-CM | POA: Diagnosis present

## 2021-04-16 DIAGNOSIS — Z23 Encounter for immunization: Secondary | ICD-10-CM | POA: Diagnosis present

## 2021-04-16 DIAGNOSIS — M1990 Unspecified osteoarthritis, unspecified site: Secondary | ICD-10-CM | POA: Diagnosis present

## 2021-04-16 DIAGNOSIS — N3281 Overactive bladder: Secondary | ICD-10-CM | POA: Diagnosis present

## 2021-04-16 DIAGNOSIS — K5730 Diverticulosis of large intestine without perforation or abscess without bleeding: Secondary | ICD-10-CM | POA: Diagnosis present

## 2021-04-16 DIAGNOSIS — Z20822 Contact with and (suspected) exposure to covid-19: Secondary | ICD-10-CM | POA: Diagnosis present

## 2021-04-16 DIAGNOSIS — I110 Hypertensive heart disease with heart failure: Secondary | ICD-10-CM | POA: Diagnosis present

## 2021-04-16 DIAGNOSIS — I700 Atherosclerosis of aorta: Secondary | ICD-10-CM | POA: Diagnosis present

## 2021-04-16 DIAGNOSIS — M810 Age-related osteoporosis without current pathological fracture: Secondary | ICD-10-CM | POA: Diagnosis present

## 2021-04-16 DIAGNOSIS — Z818 Family history of other mental and behavioral disorders: Secondary | ICD-10-CM | POA: Diagnosis not present

## 2021-04-16 DIAGNOSIS — J8410 Pulmonary fibrosis, unspecified: Secondary | ICD-10-CM | POA: Diagnosis present

## 2021-04-16 DIAGNOSIS — R339 Retention of urine, unspecified: Secondary | ICD-10-CM | POA: Diagnosis present

## 2021-04-16 DIAGNOSIS — I5032 Chronic diastolic (congestive) heart failure: Secondary | ICD-10-CM | POA: Diagnosis present

## 2021-04-16 DIAGNOSIS — I452 Bifascicular block: Secondary | ICD-10-CM | POA: Diagnosis present

## 2021-04-16 DIAGNOSIS — Z823 Family history of stroke: Secondary | ICD-10-CM | POA: Diagnosis not present

## 2021-04-16 DIAGNOSIS — I2510 Atherosclerotic heart disease of native coronary artery without angina pectoris: Secondary | ICD-10-CM | POA: Diagnosis present

## 2021-04-16 DIAGNOSIS — G43909 Migraine, unspecified, not intractable, without status migrainosus: Secondary | ICD-10-CM | POA: Diagnosis present

## 2021-04-16 LAB — GLUCOSE, CAPILLARY
Glucose-Capillary: 94 mg/dL (ref 70–99)
Glucose-Capillary: 131 mg/dL — ABNORMAL HIGH (ref 70–99)
Glucose-Capillary: 106 mg/dL — ABNORMAL HIGH (ref 70–99)

## 2021-04-16 LAB — COMPREHENSIVE METABOLIC PANEL
ALT: 13 U/L (ref 0–44)
AST: 26 U/L (ref 15–41)
Albumin: 3.4 g/dL — ABNORMAL LOW (ref 3.5–5.0)
Alkaline Phosphatase: 79 U/L (ref 38–126)
Anion gap: 12 (ref 5–15)
BUN: 15 mg/dL (ref 8–23)
CO2: 25 mmol/L (ref 22–32)
Calcium: 9 mg/dL (ref 8.9–10.3)
Chloride: 104 mmol/L (ref 98–111)
Creatinine, Ser: 0.73 mg/dL (ref 0.44–1.00)
GFR, Estimated: >60 mL/min (ref >60)
Glucose, Bld: 103 mg/dL — ABNORMAL HIGH (ref 70–99)
Potassium: 5 mmol/L (ref 3.5–5.1)
Sodium: 141 mmol/L (ref 135–145)
Total Bilirubin: 1 mg/dL (ref 0.3–1.2)
Total Protein: 5.7 g/dL — ABNORMAL LOW (ref 6.5–8.1)

## 2021-04-16 LAB — URINALYSIS, ROUTINE W REFLEX MICROSCOPIC
Bacteria, UA: NONE SEEN
Bilirubin Urine: NEGATIVE
Glucose, UA: NEGATIVE mg/dL
Ketones, ur: 5 mg/dL — AB
Nitrite: NEGATIVE
Protein, ur: 30 mg/dL — AB
Specific Gravity, Urine: >1.046 — ABNORMAL HIGH (ref 1.005–1.030)
WBC, UA: >50 WBC/hpf — ABNORMAL HIGH (ref 0–5)
pH: 5 (ref 5.0–8.0)

## 2021-04-16 LAB — CBC
HCT: 40.7 % (ref 36.0–46.0)
Hemoglobin: 13 g/dL (ref 12.0–15.0)
MCH: 30.2 pg (ref 26.0–34.0)
MCHC: 31.9 g/dL (ref 30.0–36.0)
MCV: 94.7 fL (ref 80.0–100.0)
Platelets: 152 10*3/uL (ref 150–400)
RBC: 4.3 MIL/uL (ref 3.87–5.11)
RDW: 13.2 % (ref 11.5–15.5)
WBC: 7.4 10*3/uL (ref 4.0–10.5)
nRBC: 0 % (ref 0.0–0.2)

## 2021-04-16 LAB — HEMOGLOBIN A1C
Hgb A1c MFr Bld: 6.4 % — ABNORMAL HIGH (ref 4.8–5.6)
Mean Plasma Glucose: 136.98 mg/dL

## 2021-04-16 LAB — RESP PANEL BY RT-PCR (FLU A&B, COVID) ARPGX2
Influenza A by PCR: NEGATIVE
Influenza B by PCR: NEGATIVE
SARS Coronavirus 2 by RT PCR: NEGATIVE

## 2021-04-16 LAB — CBG MONITORING, ED: Glucose-Capillary: 104 mg/dL — ABNORMAL HIGH (ref 70–99)

## 2021-04-16 MED ORDER — METOPROLOL TARTRATE 5 MG/5ML IV SOLN: 5.0000 mg | Freq: Four times a day (QID) | INTRAVENOUS | Status: DC | PRN

## 2021-04-16 MED ORDER — ONDANSETRON HCL 4 MG/2ML IJ SOLN: 4.0000 mg | Freq: Four times a day (QID) | INTRAMUSCULAR | Status: DC | PRN

## 2021-04-16 MED ORDER — PRAMIPEXOLE DIHYDROCHLORIDE 0.25 MG PO TABS
0.2500 mg | ORAL_TABLET | Freq: Two times a day (BID) | ORAL | Status: DC
  Administered 2021-04-16 – 2021-04-18 (×4): 0.25 mg via ORAL
  Filled 2021-04-16 (×5): qty 1

## 2021-04-16 MED ORDER — ONDANSETRON HCL 4 MG PO TABS: 4.0000 mg | ORAL_TABLET | Freq: Four times a day (QID) | ORAL | Status: DC | PRN

## 2021-04-16 MED ORDER — DICYCLOMINE HCL 20 MG PO TABS
20.0000 mg | ORAL_TABLET | Freq: Two times a day (BID) | ORAL | Status: DC | PRN
  Filled 2021-04-16: qty 1

## 2021-04-16 MED ORDER — MORPHINE SULFATE (PF) 2 MG/ML IV SOLN
2.0000 mg | INTRAVENOUS | Status: DC | PRN
  Administered 2021-04-16: 2 mg via INTRAVENOUS
  Filled 2021-04-16: qty 1

## 2021-04-16 MED ORDER — MAGNESIUM OXIDE -MG SUPPLEMENT 400 (240 MG) MG PO TABS
400.0000 mg | ORAL_TABLET | Freq: Every day | ORAL | Status: DC
  Administered 2021-04-16 – 2021-04-18 (×3): 400 mg via ORAL
  Filled 2021-04-16 (×4): qty 1

## 2021-04-16 MED ORDER — INFLUENZA VAC A&B SA ADJ QUAD 0.5 ML IM PRSY
0.5000 mL | PREFILLED_SYRINGE | INTRAMUSCULAR | Status: AC
  Administered 2021-04-18: 0.5 mL via INTRAMUSCULAR
  Filled 2021-04-16 (×2): qty 0.5

## 2021-04-16 MED ORDER — LACTATED RINGERS IV SOLN: INTRAVENOUS | Status: DC

## 2021-04-16 MED ORDER — ENOXAPARIN SODIUM 40 MG/0.4ML IJ SOSY
40.0000 mg | PREFILLED_SYRINGE | INTRAMUSCULAR | Status: DC
  Administered 2021-04-16 – 2021-04-18 (×2): 40 mg via SUBCUTANEOUS
  Filled 2021-04-16 (×3): qty 0.4

## 2021-04-16 MED ORDER — CEPHALEXIN 250 MG PO CAPS
250.0000 mg | ORAL_CAPSULE | Freq: Every day | ORAL | Status: DC
  Administered 2021-04-16 – 2021-04-18 (×3): 250 mg via ORAL
  Filled 2021-04-16 (×4): qty 1

## 2021-04-16 MED ORDER — MAGNESIUM OXIDE 400 MG PO TABS: 400.0000 mg | ORAL_TABLET | Freq: Every day | ORAL | Status: DC

## 2021-04-16 MED ORDER — INSULIN ASPART 100 UNIT/ML IJ SOLN
0.0000 [IU] | Freq: Every day | INTRAMUSCULAR | Status: DC
  Filled 2021-04-16: qty 0.05

## 2021-04-16 MED ORDER — INSULIN ASPART 100 UNIT/ML IJ SOLN
0.0000 [IU] | Freq: Three times a day (TID) | INTRAMUSCULAR | Status: DC
  Administered 2021-04-17: 5 [IU] via SUBCUTANEOUS
  Administered 2021-04-18: 3 [IU] via SUBCUTANEOUS
  Filled 2021-04-16: qty 0.15

## 2021-04-16 NOTE — Plan of Care (Signed)

## 2021-04-16 NOTE — Progress Notes (Addendum)
Daughter at [facility]  RKY:[id number] DOB: June 08, 1935 DOA: 04/15/2021 PCP: Mosie Lukes, MD   Brief Narrative:  Heather [name] is a 86 y.o. female with medical history significant of patient with history of coronary artery disease status post cath multiple times and PCI, diabetes, dyspnea on exertion, GERD, history of cervical cancer with vaginal hysterectomy done, osteoarthritis, essential hypertension status postcholecystectomy who presented to the ER yesterday with abdominal pain.  She has had overactive bladder following up with OB/GYN.  She was seen last night again discharge early in the morning per her request after symptoms of abdominal pain and nausea resolved.  Presented back to the ED within 24 hours for worsening symptoms and appears to have a left inguinal hernia containing small bowel.  This has been manually reduced now by surgery.  Patient however is being admitted to the medical service for observation due to multiple medical problems.  Assessment & Plan:   Small bowel obstruction due to inguinal hernia:  -NG tube in place to suction, will pause today per family's wishes and discussion as she wants to attempt to attend her grandson's wedding this weekend and wishes to trial p.o. as soon as possible to ensure that she has recovered enough to attend the ceremony  -Surgery following, appreciate insight and recommendations, patient has many questions about possible procedure, timing, recovery timing and outcomes which we will defer to surgical team  -If NG tube clamping trial goes well today can consider sips and ice chips this afternoon and follow clinically, given patient's recurrent issues as outlined above patient is high risk for recurrent inguinal hernia incarceration and obstruction -Lengthy discussion at bedside with daughter and patient about unclear prognosis if she chooses not to have surgery as she may have recurrent obstruction given hernia at any  time in the near future -patient continues to be fixated on possible recurrence of episode which we discussed would only be truly prevented with surgery which she is currently attempting to avoid.  Hypertension, essential -Currently well controlled, will resume home medications if p.o. is able to be tolerated over the next 12 hours   None insulin-dependent diabetes, borderline uncontrolled with hyperglycemia -Continue sliding scale insulin -A1c 6.4   Hyperlipidemia: Resume statin once patient is able to take p.o.   Chronic diastolic heart failure:  -Continue to follow clinically, avoid aggressive IV fluids and volume overload   Coronary artery disease: Chronic, follow clinically resume home medications once able to take p.o. safely  Urinary retention/chronic recurrent UTI, POA Continue home keflex and antispasmodic  DVT prophylaxis: Lovenox Code Status: Full Family Communication: Bedside  Status is: Inpatient  Dispo: The patient is from: Home              Anticipated d/c is to: To be determined              Anticipated d/c date is: Pending clinical course              Patient currently not medically stable for discharge  Consultants:  Surgery  Procedures:  None planned  Antimicrobials:  Not indicated  Subjective: No acute issues/events overnight, pain well controlled.  Objective: Vitals:   04/16/21 0300 04/16/21 0400 04/16/21 0444 04/16/21 0500  BP: (!) 149/55 (!) 160/61  (!) 157/64  Pulse: 63 67  70  Resp:  16  16  Temp:      TempSrc:      SpO2: 95% (!) 86% 94% 95%  Weight:      Height:        Intake/Output Summary (Last 24 hours) at 04/16/2021 0738 Last data filed at 04/16/2021 0200 Gross per 24 hour  Intake --  Output 1800 ml  Net -1800 ml   Filed Weights   04/15/21 1245  Weight: 66.7 kg    Examination:  General exam: Appears calm and comfortable  Respiratory system: Clear to auscultation. Respiratory effort normal. Cardiovascular system: S1 &  S2 heard, RRR. No JVD, murmurs, rubs, gallops or clicks. No pedal edema. Gastrointestinal system: Abdomen is nondistended, soft and nontender. No organomegaly or masses felt. Normal bowel sounds heard. Central nervous system: Alert and oriented. No focal neurological deficits. Extremities: Symmetric 5 x 5 power. Skin: No rashes, lesions or ulcers Psychiatry: Judgement and insight appear normal. Mood & affect appropriate.   Data Reviewed: I have personally reviewed following labs and imaging studies  CBC: Recent Labs  Lab 04/14/21 2100 04/15/21 1317 04/16/21 0500  WBC 6.7 7.6 7.4  NEUTROABS 4.2 5.5  --   HGB 13.5 14.9 13.0  HCT 41.4 43.3 40.7  MCV 89.8 90.6 94.7  PLT 164 192 754   Basic Metabolic Panel: Recent Labs  Lab 04/10/21 1118 04/14/21 2100 04/15/21 1317 04/16/21 0500  NA 139 135 137 141  K 4.3 3.7 4.2 5.0  CL 101 99 100 104  CO2 24 26 28 25   GLUCOSE 135* 129* 147* 103*  BUN 22 15 13 15   CREATININE 0.90 0.76 0.97 0.73  CALCIUM 9.6 9.4 9.8 9.0   GFR: Estimated Creatinine Clearance: 45.8 mL/min (by C-G formula based on SCr of 0.73 mg/dL). Liver Function Tests: Recent Labs  Lab 04/14/21 2100 04/15/21 1317 04/16/21 0500  AST 18 21 26   ALT 19 20 13   ALKPHOS 96 108 79  BILITOT 0.6 0.6 1.0  PROT 7.1 7.2 5.7*  ALBUMIN 4.6 4.5 3.4*   Recent Labs  Lab 04/15/21 1317  LIPASE 41   No results for input(s): AMMONIA in the last 168 hours. Coagulation Profile: No results for input(s): INR, PROTIME in the last 168 hours. Cardiac Enzymes: No results for input(s): CKTOTAL, CKMB, CKMBINDEX, TROPONINI in the last 168 hours. BNP (last 3 results) Recent Labs    08/19/20 1351  PROBNP 428   HbA1C: No results for input(s): HGBA1C in the last 72 hours. CBG: No results for input(s): GLUCAP in the last 168 hours. Lipid Profile: No results for input(s): CHOL, HDL, LDLCALC, TRIG, CHOLHDL, LDLDIRECT in the last 72 hours. Thyroid Function Tests: No results for  input(s): TSH, T4TOTAL, FREET4, T3FREE, THYROIDAB in the last 72 hours. Anemia Panel: No results for input(s): VITAMINB12, FOLATE, FERRITIN, TIBC, IRON, RETICCTPCT in the last 72 hours. Sepsis Labs: Recent Labs  Lab 04/15/21 2224  LATICACIDVEN 1.3    Recent Results (from the past 240 hour(s))  Resp Panel by RT-PCR (Flu A&B, Covid) Nasopharyngeal Swab     Status: None   Collection Time: 04/15/21 11:40 PM   Specimen: Nasopharyngeal Swab; Nasopharyngeal(NP) swabs in vial transport medium  Result Value Ref Range Status   SARS Coronavirus 2 by RT PCR NEGATIVE NEGATIVE Final    Comment: (NOTE) SARS-CoV-2 target nucleic acids are NOT DETECTED.  The SARS-CoV-2 RNA is generally detectable in upper respiratory specimens during the acute phase of infection. The lowest concentration of SARS-CoV-2 viral copies this assay can detect is 138 copies/mL. A negative result does not preclude SARS-Cov-2 infection and should not be used as the sole basis for treatment or  other patient management decisions. A negative result may occur with  improper specimen collection/handling, submission of specimen other than nasopharyngeal swab, presence of viral mutation(s) within the areas targeted by this assay, and inadequate number of viral copies(<138 copies/mL). A negative result must be combined with clinical observations, patient history, and epidemiological information. The expected result is Negative.  Fact Sheet for Patients:  EntrepreneurPulse.com.au  Fact Sheet for Healthcare Providers:  IncredibleEmployment.be  This test is no t yet approved or cleared by the [place] FDA and  has been authorized for detection and/or diagnosis of SARS-CoV-2 by FDA under an Emergency Use Authorization (EUA). This EUA will remain  in effect (meaning this test can be used) for the duration of the COVID-19 declaration under Section 564(b)(1) of the Act, 21 U.S.C.section  360bbb-3(b)(1), unless the authorization is terminated  or revoked sooner.       Influenza A by PCR NEGATIVE NEGATIVE Final   Influenza B by PCR NEGATIVE NEGATIVE Final    Comment: (NOTE) The Xpert Xpress SARS-CoV-2/FLU/RSV plus assay is intended as an aid in the diagnosis of influenza from Nasopharyngeal swab specimens and should not be used as a sole basis for treatment. Nasal washings and aspirates are unacceptable for Xpert Xpress SARS-CoV-2/FLU/RSV testing.  Fact Sheet for Patients: EntrepreneurPulse.com.au  Fact Sheet for Healthcare Providers: IncredibleEmployment.be  This test is not yet approved or cleared by the [place] FDA and has been authorized for detection and/or diagnosis of SARS-CoV-2 by FDA under an Emergency Use Authorization (EUA). This EUA will remain in effect (meaning this test can be used) for the duration of the COVID-19 declaration under Section 564(b)(1) of the Act, 21 U.S.C. section 360bbb-3(b)(1), unless the authorization is terminated or revoked.  Performed at [facility], [place] [address], [place], [place] [address]          Radiology Studies: CT Abdomen Pelvis W Contrast  Result Date: 04/15/2021 CLINICAL DATA:  Bowel obstructed cecectomy. Upper abdominal pain today. EXAM: CT ABDOMEN AND PELVIS WITH CONTRAST TECHNIQUE: Multidetector CT imaging of the abdomen and pelvis was performed using the standard protocol following bolus administration of intravenous contrast. CONTRAST:  86mL OMNIPAQUE IOHEXOL 350 MG/ML SOLN COMPARISON:  04/14/2021 FINDINGS: Lower chest: Calcified granuloma in the right lung. Large esophageal hiatal hernia. Wall thickening in the visualized distal esophagus likely representing reflux disease. Coronary artery calcifications. Hepatobiliary: No focal liver abnormality is seen. Status post cholecystectomy. No biliary dilatation. Pancreas: Unremarkable. No pancreatic  ductal dilatation or surrounding inflammatory changes. Spleen: Normal in size without focal abnormality. Adrenals/Urinary Tract: No adrenal gland nodules. Renal nephrograms are symmetrical and homogeneous. Bilateral renal parenchymal lobulation or scarring. Cyst on the right kidney. No hydronephrosis or hydroureter. Bladder is filled with contrast material. No bladder wall thickening. Gas in the bladder may arise from instrumentation or cystitis. Stomach/Bowel: Stomach is fluid-filled with mild distention. There is a left inguinal hernia containing small bowel. Proximal to the hernia, there is dilatation of fluid-filled small bowel with decompression of the bowel distal to the hernia, consistent with small bowel obstruction. This is developing since the previous study. Colon is not abnormally distended. Scattered stool throughout the colon. Sigmoid colonic diverticulosis without evidence of acute diverticulitis. Appendix is normal. Vascular/Lymphatic: Diffuse calcification of the aorta. No aneurysm. No significant lymphadenopathy. Reproductive: Uterus is surgically absent. No abnormal pelvic masses. Other: No free air or free fluid in the abdomen. Multiple anterior abdominal wall hernias containing fat. These are likely postoperative. Musculoskeletal: Degenerative changes in the spine.  No destructive bone lesions. IMPRESSION: 1. Left inguinal hernia containing small bowel. Since the previous study, there is interval development of small bowel obstruction at the level of the hernia. Distal small bowel are decompressed. 2. Large esophageal hiatal hernia. Wall thickening of the distal esophagus suggesting reflux disease. 3. Diffuse aortic atherosclerosis. 4. Diverticulosis of the sigmoid colon without evidence of acute diverticulitis. 5. Gas in the bladder could indicate instrumentation or infection. 6. Anterior abdominal wall hernias containing fat, likely postsurgical. Electronically Signed   By: Lucienne Capers  M.D.   On: 04/15/2021 23:18   CT ABDOMEN PELVIS W CONTRAST  Result Date: 04/14/2021 CLINICAL DATA:  Abdominal distention, acute abdominal pain. Lower abdominal pain starting today. EXAM: CT ABDOMEN AND PELVIS WITH CONTRAST TECHNIQUE: Multidetector CT imaging of the abdomen and pelvis was performed using the standard protocol following bolus administration of intravenous contrast. CONTRAST:  167mL OMNIPAQUE IOHEXOL 300 MG/ML  SOLN COMPARISON:  02/04/2021 FINDINGS: Lower chest: Calcified granuloma in the right lower lung. Mild dependent changes in the lung bases. Moderate esophageal hiatal hernia. Hepatobiliary: Surgical absence of the gallbladder. No bile duct dilatation. Liver is unremarkable. Pancreas: Unremarkable. No pancreatic ductal dilatation or surrounding inflammatory changes. Spleen: Normal in size without focal abnormality. Adrenals/Urinary Tract: No adrenal gland nodules. Bilateral renal scarring versus fetal lobulation. Bilateral renal cysts appearing benign. No hydronephrosis or hydroureter. Bladder wall is not thickened. There is gas in the bladder which could come from instrumentation or infection. Stomach/Bowel: Stomach, small bowel, and colon are not abnormally distended. Scattered diverticula mostly in the sigmoid colon. No inflammatory changes to suggest acute diverticulitis. Appendix is normal. Vascular/Lymphatic: Aortic atherosclerosis. No enlarged abdominal or pelvic lymph nodes. Reproductive: Uterus is surgically absent.  No abnormal pelvic mass. Other: There is a moderate left inguinal hernia containing a loop of small bowel. No proximal obstruction or wall thickening. Minimal periumbilical hernia containing fat. Scarring along the anterior abdominal wall is likely postoperative. Musculoskeletal: Degenerative changes in the spine. No destructive bone lesions. IMPRESSION: 1. Left inguinal hernia containing small bowel. No proximal obstruction. 2. Diverticulosis of the sigmoid colon without  evidence of diverticulitis. 3. Moderate esophageal hiatal hernia. 4. Gas within the bladder may result from instrumentation or infection. 5. Aortic atherosclerosis. Electronically Signed   By: Lucienne Capers M.D.   On: 04/14/2021 22:12   DG Abd Acute W/Chest  Result Date: 04/15/2021 CLINICAL DATA:  Upper abdominal pain with nausea and vomiting. EXAM: DG ABDOMEN ACUTE WITH 1 VIEW CHEST COMPARISON:  None. FINDINGS: Small hiatal hernia with air-fluid level. Lungs are clear. No pleural effusion. No free intraperitoneal air. No dilated loops of bowel. IMPRESSION: 1. Small hiatal hernia. No acute cardiopulmonary disease. 2. Nonobstructive bowel gas pattern. Electronically Signed   By: Ulyses Jarred M.D.   On: 04/15/2021 22:36        Scheduled Meds:  enoxaparin (LOVENOX) injection  40 mg Subcutaneous Q24H   insulin aspart  0-15 Units Subcutaneous TID WC   insulin aspart  0-5 Units Subcutaneous QHS   Continuous Infusions:  lactated ringers 125 mL/hr at 04/16/21 0259     LOS: 0 days   Time spent: 71min   Latoi Giraldo C Brianda Beitler, DO [facility]  If 7PM-7AM, please contact night-coverage [email or web address]  04/16/2021, 7:38 AM

## 2021-04-16 NOTE — H&P (Signed)
History and Physical   Heather [name] XEN:[id number] DOB: 20-Aug-1934 DOA: 04/15/2021  Referring MD/NP/PA: Dr. Dina Rich  PCP: Mosie Lukes, MD   Patient coming from: Home  Chief Complaint: Abdominal pain  HPI: Heather [name] is a 86 y.o. female with medical history significant of patient with history of coronary artery disease status post cath multiple times and PCI, diabetes, dyspnea on exertion, GERD, history of cervical cancer with vaginal hysterectomy done, osteoarthritis, essential hypertension status postcholecystectomy who presented to the ER yesterday with abdominal pain.  She has had overactive bladder following up with OB/GYN.  She was seen last night again discharge early in the morning per her request after symptoms of abdominal pain and nausea resolved.  Came back again today with worsening symptoms and appears to have a left inguinal hernia containing small bowel.  This has been manually reduced now by surgery.  Patient however is being admitted to the medical service for observation due to multiple medical problems..  ED Course: Temperature 97.2 blood pressure 180/73, pulse 72 respirate 18 oxygen 93% room air.  CBC and chemistry entirely within normal.  Lactic acid 1.3 urinalysis negative.  Abdominal x-ray showed small hiatal hernia with no acute cardiopulmonary disease and nonobstructive bowel gas pattern.  CT abdomen pelvis showed left inguinal hernia containing small bowel.  There is an interval developed small bowel obstruction at the below the hernia with distal small bowel decompressed.  Also large esophageal hiatal hernia.  Diffuse diverticulosis of the colon.  Hernia was reduced and patient being admitted for further evaluation.  Review of Systems: As per HPI otherwise 10 point review of systems negative.    Past Medical History:  Diagnosis Date   Chronic stable angina [facility])    Coronary artery disease cardiologist--- dr Burt Knack   hx NSTEMI  w/ cardiac cath 03-16-2005 PCI  with DES to LCx;   cardiac cath 06-27-2005  PCI with DES to RCA with residual dx LAD manage medcially;  lexiscan 01-26-20211 normal no ischemia, ef 70%;  stress echo w/ dobutamine 10-24-2011 negative ishcmeia , normal ef // Myoview 2/22: EF 67, no ischemia or infarction, no TID, low risk    Diabetes mellitus type 2, diet-controlled ([place])    followed by pcp  (10-19-2020  pt stated checks daily in am,  fasting blood surgar--- 115--120s)   DOE (dyspnea on exertion)    per pt "when I over do",  ok with household chores   Echocardiogram 08/2020    Echocardiogram 2/22: EF 55-60, no RWMA, mild LVH, Gr 2 DD, GLS-21.7%, normal RVSF, trivial MR, RVSP 39.5   Edema of right lower extremity    GERD (gastroesophageal reflux disease)    Hiatal hernia    recurrence,  hx HH repair 1989   History of cervical cancer    s/p  vaginal hysterectomy   History of DVT of lower extremity 2016   11-29-2014 post op right TKA of right lower extremity and completed xarelto    History of esophageal stricture    hx s/p dilatation's   History of gastric ulcer 2005 approx.   History of palpitations 2010   event monitor 07-07-2009 showed NSR w/ freq. SVT ectopies with short runs, rare PVCs   History of TIA (transient ischemic attack) 06/1999   12-15-2019  per pt had several TIA between 12/ 2000 to 02/ 2001 , was sent to specialist _0 , had test that was normal (10-19-2020 pt stated no TIAs since ) but has residual of essential tremors  of right arm/ hand   Hypertension    Intermittent palpitations    IT band syndrome    Migraine    "ice pick headche lasts about 30 seconds"   Mixed hyperlipidemia    Mixed incontinence urge and stress    Multiple thyroid nodules    followed by pcp---   ultrasound 11-22-2014 no bx   (12-15-2019 per pt had a endocrinologist and was told did not need bx)   OA (osteoarthritis)    knees, elbow, hip, ankles   Occasional tremors    right arm/ hand  s/p TIA residual 2000   Osteoporosis     taking vitamin d   Peroneal DVT (deep venous thrombosis) ([place]) 12/22/2014   Right bundle branch block (RBBB) with left anterior fascicular block (LAFB)    RLS (restless legs syndrome)    S/P drug eluting coronary stent placement 2006   03-16-2005  PCI x1 DES to LCx;   06-27-2005  PCI x1 DES to RCA   Urinary retention    post op sling prodecure on 10-27-2020, has foley cathether    Past Surgical History:  Procedure Laterality Date   ANTERIOR AND POSTERIOR REPAIR N/A 12/22/2019   Procedure: ANTERIOR (CYSTOCELE)  REPAIR;  Surgeon: Janyth Contes, MD;  Location: [place];  Service: Gynecology;  Laterality: N/A;   ANTERIOR AND POSTERIOR REPAIR WITH SACROSPINOUS FIXATION N/A 10/27/2020   Procedure: SACROSPINOUS LIGAMENT FIXATION;  Surgeon: Jaquita Folds, MD;  Location: [facility];  Service: Gynecology;  Laterality: N/A;   BLADDER SUSPENSION N/A 10/27/2020   Procedure: TRANSVAGINAL TAPE (TVT) PROCEDURE;  Surgeon: Jaquita Folds, MD;  Location: [facility];  Service: Gynecology;  Laterality: N/A;   CATARACT EXTRACTION W/ INTRAOCULAR LENS  IMPLANT, BILATERAL  2015   CHOLECYSTECTOMY N/A 01/25/2021   Procedure: LAPAROSCOPIC CHOLECYSTECTOMY WITH INTRAOPERATIVE CHOLANGIOGRAM AND LYSIS OF ADHESIONS;  Surgeon: Michael Boston, MD;  Location: [facility];  Service: General;  Laterality: N/A;   COLONOSCOPY  last one ?   CORONARY ANGIOPLASTY WITH STENT PLACEMENT  03-16-2005   dr Lia Foyer   PCI and DES x1 to LCx   CORONARY ANGIOPLASTY WITH STENT PLACEMENT  06-27-2005  dr Lia Foyer   PCI and DES x1 to RCA with residual disease LAD 70-80% to manage medically   CYSTOSCOPY N/A 10/27/2020   Procedure: CYSTOSCOPY;  Surgeon: Jaquita Folds, MD;  Location[facility];  Service: Gynecology;  Laterality: N/A;   CYSTOSCOPY N/A 11/09/2020   Procedure: CYSTOSCOPY;  Surgeon: Jaquita Folds, MD;  Location: [facility];   Service: Gynecology;  Laterality: N/A;   FOOT SURGERY Left 1990s   left foot stress fracture repair, per pt no hardware   HIATAL HERNIA REPAIR  1989   INCISIONAL HERNIA REPAIR N/A 01/25/2021   Procedure: PRIMARY REPAIR OF INCISIONAL HERNIA;  Surgeon: Michael Boston, MD;  Location: [facility];  Service: General;  Laterality: N/A;   KNEE ARTHROSCOPY Bilateral right ?/   left x2 , last one 09-12-2009 @ [place]   PUBOVAGINAL SLING N/A 11/09/2020   Procedure: [place];  Surgeon: Jaquita Folds, MD;  Location: [facility];  Service: Gynecology;  Laterality: N/A;   RECTOCELE REPAIR N/A 04/20/2020   Procedure: POSTERIOR REPAIR (RECTOCELE);  Surgeon: Janyth Contes, MD;  Location: [facility];  Service: Gynecology;  Laterality: N/A;   RECTOCELE REPAIR N/A 10/27/2020   Procedure: POSTERIOR REPAIR (RECTOCELE);  Surgeon: Jaquita Folds, MD;  Location:  [place];  Service: Gynecology;  Laterality: N/A;  total time requested for all procedures is 2 hours   TOTAL KNEE ARTHROPLASTY  11/12/2011   Procedure: TOTAL KNEE ARTHROPLASTY;  Surgeon: Lorn Junes, MD;  Location: [place];  Service: Orthopedics;  Laterality: Left;  Dr Noemi Chapel wants 90 minutes for this case   TOTAL KNEE ARTHROPLASTY Right 11/29/2014   Procedure: RIGHT TOTAL KNEE ARTHROPLASTY;  Surgeon: Vickey Huger, MD;  Location: [place];  Service: Orthopedics;  Laterality: Right;   UPPER GASTROINTESTINAL ENDOSCOPY  last one 04-25-2017   with dilatation esophageal stricture and savary dilatation   VAGINAL HYSTERECTOMY  1988    no ovaries removed for bleeeding     reports that she has never smoked. She has never used smokeless tobacco. She reports that she does not drink alcohol and does not use drugs.  Allergies  Allergen Reactions   Baclofen Other (See Comments)    Hyperactivity    Nitrofurantoin Rash   Tramadol Other (See Comments)    Hallucinations     Family History   Problem Relation Age of Onset   Stroke Father        family hx of M 1st degree relative <50   Coronary artery disease Mother    Heart disease Mother    Depression Brother    Stroke Brother    Diabetes Brother    Cancer Brother        bladder with mets   Diabetes Daughter        borderline   Hypertension Daughter    Arthritis Other        family hx of   Hypertension Other        family hx of   Other Other        family hx of cardiovascular disorder   Thyroid disease Daughter    Breast cancer Neg Hx    Colon cancer Neg Hx    Anesthesia problems Neg Hx    Hypotension Neg Hx    Malignant hyperthermia Neg Hx    Pseudochol deficiency Neg Hx    Colon polyps Neg Hx    Esophageal cancer Neg Hx    Rectal cancer Neg Hx    Stomach cancer Neg Hx      Prior to Admission medications   Medication Sig Start Date End Date Taking? Authorizing Provider  acetaminophen (TYLENOL) 500 MG tablet Take 1,000 mg by mouth every 6 (six) hours as needed for moderate pain.    [provider]  amLODipine (NORVASC) 10 MG tablet TAKE 1 TABLET EVERY DAY 02/01/21   Mosie Lukes, MD  Ascorbic Acid (VITAMIN C) 500 MG CHEW Chew 500 mg by mouth daily.    [provider]  aspirin EC 81 MG tablet Take 1 tablet (81 mg total) by mouth daily. 05/04/15   [name], MD  Blood Glucose Monitoring Suppl (TRUE METRIX AIR GLUCOSE METER) w/Device KIT USE TO CHECK BLOOD SUGAR ONCE DAILY AND AS NEEDED.  DX CODE E11.9 07/27/20   Mosie Lukes, MD  cephALEXin (KEFLEX) 250 MG capsule Take 1 capsule (250 mg total) by mouth daily. 03/15/21   Jaquita Folds, MD  Cholecalciferol (VITAMIN D) 50 MCG (2000 UT) CAPS Take 2,000 Units by mouth daily.     [provider]  diclofenac Sodium (VOLTAREN) 1 % GEL Apply 1 application topically 4 (four) times daily as needed (hip pain).    [provider]  dicyclomine (BENTYL) 20 MG tablet  Take 1 tablet (20 mg total) by mouth 2 (two) times daily as  needed for spasms (abdominal cramping). 04/15/21   Mesner, Corene Cornea, MD  famotidine (PEPCID) 20 MG tablet Take 20 mg by mouth at bedtime as needed for heartburn or indigestion.    [provider]  furosemide (LASIX) 20 MG tablet Take 1 tablet (20 mg total) by mouth daily. 03/29/21   Supple, Megan E, RPH-CPP  glucose blood (TRUE METRIX BLOOD GLUCOSE TEST) test strip USE TO CHECK BLOOD SUGAR ONCE A DAY OR AS NEEDED.  DX CODE: E11.9 07/27/20   Mosie Lukes, MD  isosorbide mononitrate (IMDUR) 60 MG 24 hr tablet Take 1.5 tablets (90 mg total) by mouth daily. 12/14/20 12/14/21  [name] T, PA-C  ketorolac (TORADOL) 10 MG tablet Take 1 tablet (10 mg total) by mouth every 6 (six) hours as needed. 10/30/20   Jaquita Folds, MD  losartan (COZAAR) 100 MG tablet Take 0.5 tablets (50 mg total) by mouth 2 (two) times daily. 04/10/21   [name], MD  magnesium oxide (MAG-OX) 400 MG tablet Take 400 mg by mouth at bedtime.    [provider]  metoprolol succinate (TOPROL-XL) 25 MG 24 hr tablet Take 1 tablet (25 mg total) by mouth daily. 04/10/21   [name], MD  nitroGLYCERIN (NITROSTAT) 0.4 MG SL tablet Place 1 tablet (0.4 mg total) under the tongue every 5 (five) minutes as needed for chest pain. 08/29/20 08/29/21  [name] T, PA-C  nystatin (MYCOSTATIN/NYSTOP) powder Apply 1 application topically 3 (three) times daily. 03/08/21   Jaquita Folds, MD  omeprazole (PRILOSEC) 20 MG capsule Take 1 capsule (20 mg total) by mouth in the morning. 02/10/21   Annita Brod, MD  ondansetron (ZOFRAN ODT) 4 MG disintegrating tablet Take 1 tablet (4 mg total) by mouth every 8 (eight) hours as needed. 51m ODT q4 hours prn nausea/vomit 04/15/21   Mesner, JCorene Cornea MD  potassium chloride SA (KLOR-CON) 20 MEQ tablet Take 0.5 tablets (10 mEq total) by mouth daily. 09/14/20   WRichardson DoppT, PA-C  pramipexole (MIRAPEX) 0.25 MG tablet TAKE 1 TABLET TWICE DAILY 03/14/21   BMosie Lukes MD   simvastatin (ZOCOR) 20 MG tablet TAKE 1 TABLET AT BEDTIME 04/10/21   CSherren Mocha MD  TRUEplus Lancets 33G MISC USE TO CHECK BLOOD SUGAR ONCE DAILY AND AS NEEDED.  DX CODE: E11.9 07/27/20   BMosie Lukes MD  Zinc 50 MG TABS Take 50 mg by mouth daily.    [provider]    Physical Exam: Vitals:   04/15/21 1752 04/15/21 2130 04/15/21 2230 04/16/21 0018  BP: (!) 176/72 (!) 147/66 (!) 152/63 (!) 161/57  Pulse: 63 65 74 77  Resp: _0 Temp:      TempSrc:      SpO2: 97% 97% 96% 93%  Weight:      Height:          Constitutional: Acutely ill looking, mild distress Vitals:   04/15/21 1752 04/15/21 2130 04/15/21 2230 04/16/21 0018  BP: (!) 176/72 (!) 147/66 (!) 152/63 (!) 161/57  Pulse: 63 65 74 77  Resp: _1 Temp:      TempSrc:      SpO2: 97% 97% 96% 93%  Weight:      Height:       Eyes: PERRL, lids and conjunctivae normal ENMT: Mucous membranes are moist. Posterior pharynx clear of any exudate or lesions.Normal dentition.  Neck: normal, supple, no masses, no thyromegaly Respiratory: clear to auscultation bilaterally, no wheezing, no crackles. Normal respiratory effort. No accessory muscle use.  Cardiovascular: Regular rate and rhythm, no murmurs / rubs / gallops. No extremity edema. 2+ pedal pulses. No carotid bruits.  Abdomen: NG tube in place, mild distention, mild tenderness, no masses palpated. No hepatosplenomegaly. Bowel sounds positive.  Musculoskeletal: no clubbing / cyanosis. No joint deformity upper and lower extremities. Good ROM, no contractures. Normal muscle tone.  Skin: no rashes, lesions, ulcers. No induration Neurologic: CN 2-12 grossly intact. Sensation intact, DTR normal. Strength 5/5 in all 4.  Psychiatric: Normal judgment and insight. Alert and oriented x 3. Normal mood.     Labs on Admission: I have personally reviewed following labs and imaging studies  CBC: Recent Labs  Lab 04/14/21 2100 04/15/21 1317  WBC 6.7 7.6   NEUTROABS 4.2 5.5  HGB 13.5 14.9  HCT 41.4 43.3  MCV 89.8 90.6  PLT 164 426   Basic Metabolic Panel: Recent Labs  Lab 04/10/21 1118 04/14/21 2100 04/15/21 1317  NA 139 135 137  K 4.3 3.7 4.2  CL 101 99 100  CO2 _0 GLUCOSE 135* 129* 147*  BUN _1 CREATININE 0.90 0.76 0.97  CALCIUM 9.6 9.4 9.8   GFR: Estimated Creatinine Clearance: 37.8 mL/min (by C-G formula based on SCr of 0.97 mg/dL). Liver Function Tests: Recent Labs  Lab 04/14/21 2100 04/15/21 1317  AST 18 21  ALT 19 20  ALKPHOS 96 108  BILITOT 0.6 0.6  PROT 7.1 7.2  ALBUMIN 4.6 4.5   Recent Labs  Lab 04/15/21 1317  LIPASE 41   No results for input(s): AMMONIA in the last 168 hours. Coagulation Profile: No results for input(s): INR, PROTIME in the last 168 hours. Cardiac Enzymes: No results for input(s): CKTOTAL, CKMB, CKMBINDEX, TROPONINI in the last 168 hours. BNP (last 3 results) Recent Labs    08/19/20 1351  PROBNP 428   HbA1C: No results for input(s): HGBA1C in the last 72 hours. CBG: No results for input(s): GLUCAP in the last 168 hours. Lipid Profile: No results for input(s): CHOL, HDL, LDLCALC, TRIG, CHOLHDL, LDLDIRECT in the last 72 hours. Thyroid Function Tests: No results for input(s): TSH, T4TOTAL, FREET4, T3FREE, THYROIDAB in the last 72 hours. Anemia Panel: No results for input(s): VITAMINB12, FOLATE, FERRITIN, TIBC, IRON, RETICCTPCT in the last 72 hours. Urine analysis:    Component Value Date/Time   COLORURINE YELLOW 04/15/2021 1317   APPEARANCEUR HAZY (A) 04/15/2021 1317   APPEARANCEUR Cloudy (A) 03/02/2021 1459   LABSPEC 1.026 04/15/2021 1317   PHURINE 7.0 04/15/2021 1317   GLUCOSEU NEGATIVE 04/15/2021 1317   GLUCOSEU NEGATIVE 03/06/2021 1105   HGBUR NEGATIVE 04/15/2021 1317   [place] 04/15/2021 1317   BILIRUBINUR Negative 03/02/2021 1500   BILIRUBINUR Negative 03/02/2021 1459   KETONESUR NEGATIVE 04/15/2021 1317   PROTEINUR 30 (A) 04/15/2021  1317   UROBILINOGEN 0.2 03/06/2021 1105   NITRITE NEGATIVE 04/15/2021 1317   LEUKOCYTESUR NEGATIVE 04/15/2021 1317   Sepsis Labs: _2 (procalcitonin:4,lacticidven:4) )No results found for this or any previous visit (from the past 240 hour(s)).   Radiological Exams on Admission: CT Abdomen Pelvis W Contrast  Result Date: 04/15/2021 CLINICAL DATA:  Bowel obstructed cecectomy. Upper abdominal pain today. EXAM: CT ABDOMEN AND PELVIS WITH CONTRAST TECHNIQUE: Multidetector CT imaging of the abdomen and pelvis was performed using the standard protocol following bolus administration of intravenous contrast. CONTRAST:  28m OMNIPAQUE IOHEXOL  350 MG/ML SOLN COMPARISON:  04/14/2021 FINDINGS: Lower chest: Calcified granuloma in the right lung. Large esophageal hiatal hernia. Wall thickening in the visualized distal esophagus likely representing reflux disease. Coronary artery calcifications. Hepatobiliary: No focal liver abnormality is seen. Status post cholecystectomy. No biliary dilatation. Pancreas: Unremarkable. No pancreatic ductal dilatation or surrounding inflammatory changes. Spleen: Normal in size without focal abnormality. Adrenals/Urinary Tract: No adrenal gland nodules. Renal nephrograms are symmetrical and homogeneous. Bilateral renal parenchymal lobulation or scarring. Cyst on the right kidney. No hydronephrosis or hydroureter. Bladder is filled with contrast material. No bladder wall thickening. Gas in the bladder may arise from instrumentation or cystitis. Stomach/Bowel: Stomach is fluid-filled with mild distention. There is a left inguinal hernia containing small bowel. Proximal to the hernia, there is dilatation of fluid-filled small bowel with decompression of the bowel distal to the hernia, consistent with small bowel obstruction. This is developing since the previous study. Colon is not abnormally distended. Scattered stool throughout the colon. Sigmoid colonic diverticulosis without  evidence of acute diverticulitis. Appendix is normal. Vascular/Lymphatic: Diffuse calcification of the aorta. No aneurysm. No significant lymphadenopathy. Reproductive: Uterus is surgically absent. No abnormal pelvic masses. Other: No free air or free fluid in the abdomen. Multiple anterior abdominal wall hernias containing fat. These are likely postoperative. Musculoskeletal: Degenerative changes in the spine. No destructive bone lesions. IMPRESSION: 1. Left inguinal hernia containing small bowel. Since the previous study, there is interval development of small bowel obstruction at the level of the hernia. Distal small bowel are decompressed. 2. Large esophageal hiatal hernia. Wall thickening of the distal esophagus suggesting reflux disease. 3. Diffuse aortic atherosclerosis. 4. Diverticulosis of the sigmoid colon without evidence of acute diverticulitis. 5. Gas in the bladder could indicate instrumentation or infection. 6. Anterior abdominal wall hernias containing fat, likely postsurgical. Electronically Signed   By: Lucienne Capers M.D.   On: 04/15/2021 23:18   CT ABDOMEN PELVIS W CONTRAST  Result Date: 04/14/2021 CLINICAL DATA:  Abdominal distention, acute abdominal pain. Lower abdominal pain starting today. EXAM: CT ABDOMEN AND PELVIS WITH CONTRAST TECHNIQUE: Multidetector CT imaging of the abdomen and pelvis was performed using the standard protocol following bolus administration of intravenous contrast. CONTRAST:  17m OMNIPAQUE IOHEXOL 300 MG/ML  SOLN COMPARISON:  02/04/2021 FINDINGS: Lower chest: Calcified granuloma in the right lower lung. Mild dependent changes in the lung bases. Moderate esophageal hiatal hernia. Hepatobiliary: Surgical absence of the gallbladder. No bile duct dilatation. Liver is unremarkable. Pancreas: Unremarkable. No pancreatic ductal dilatation or surrounding inflammatory changes. Spleen: Normal in size without focal abnormality. Adrenals/Urinary Tract: No adrenal gland  nodules. Bilateral renal scarring versus fetal lobulation. Bilateral renal cysts appearing benign. No hydronephrosis or hydroureter. Bladder wall is not thickened. There is gas in the bladder which could come from instrumentation or infection. Stomach/Bowel: Stomach, small bowel, and colon are not abnormally distended. Scattered diverticula mostly in the sigmoid colon. No inflammatory changes to suggest acute diverticulitis. Appendix is normal. Vascular/Lymphatic: Aortic atherosclerosis. No enlarged abdominal or pelvic lymph nodes. Reproductive: Uterus is surgically absent.  No abnormal pelvic mass. Other: There is a moderate left inguinal hernia containing a loop of small bowel. No proximal obstruction or wall thickening. Minimal periumbilical hernia containing fat. Scarring along the anterior abdominal wall is likely postoperative. Musculoskeletal: Degenerative changes in the spine. No destructive bone lesions. IMPRESSION: 1. Left inguinal hernia containing small bowel. No proximal obstruction. 2. Diverticulosis of the sigmoid colon without evidence of diverticulitis. 3. Moderate esophageal hiatal hernia. 4. Gas within the  bladder may result from instrumentation or infection. 5. Aortic atherosclerosis. Electronically Signed   By: Lucienne Capers M.D.   On: 04/14/2021 22:12   DG Abd Acute W/Chest  Result Date: 04/15/2021 CLINICAL DATA:  Upper abdominal pain with nausea and vomiting. EXAM: DG ABDOMEN ACUTE WITH 1 VIEW CHEST COMPARISON:  None. FINDINGS: Small hiatal hernia with air-fluid level. Lungs are clear. No pleural effusion. No free intraperitoneal air. No dilated loops of bowel. IMPRESSION: 1. Small hiatal hernia. No acute cardiopulmonary disease. 2. Nonobstructive bowel gas pattern. Electronically Signed   By: Ulyses Jarred M.D.   On: 04/15/2021 22:36      Assessment/Plan Principal Problem:   SBO (small bowel obstruction) (HCC) Active Problems:   Essential hypertension   CORONARY  ATHEROSCLEROSIS NATIVE CORONARY ARTERY   Gastroesophageal reflux disease with hiatal hernia   DM (diabetes mellitus) ([place])   Hyperlipidemia associated with type 2 diabetes mellitus (HCC)   Chronic diastolic CHF (congestive heart failure) ([place])     #1 small bowel obstruction due to inguinal hernia: This has been successfully reduced.  NG tube in place.  Surgery following.  We will admit the patient and follow.  #2 essential hypertension: Patient will be NPO.  We will follow-up with IV labetalol as needed  #3 diabetes: Sliding scale insulin  #4 hyperlipidemia: Resume statin once patient is stable  #5 chronic diastolic heart failure: We will follow patient on telemetry.  Supportive care  #6 coronary artery disease: Compensated.  Follow 1.   DVT prophylaxis: Lovenox Code Status: Full code Family Communication: No family at bedside Disposition Plan: Home Consults called: General surgery Dr. Amada Jupiter Admission status: Observation  Severity of Illness: The appropriate patient status for this patient is OBSERVATION. Observation status is judged to be reasonable and necessary in order to provide the required intensity of service to ensure the patient's safety. The patient's presenting symptoms, physical exam findings, and initial radiographic and laboratory data in the context of their medical condition is felt to place them at decreased risk for further clinical deterioration. Furthermore, it is anticipated that the patient will be medically stable for discharge from the hospital within 2 midnights of admission. The following factors support the patient status of observation.   " The patient's presenting symptoms include abdominal pain. " The physical exam findings include abdominal distention. " The initial radiographic and laboratory data are CT showing small bowel obstruction due to inguinal hernia.   Barbette Merino MD [facility] Pager [phone]  If 7PM-7AM, please  contact night-coverage [email or web address] Password [email or web address]  04/16/2021, 12:47 AM

## 2021-04-16 NOTE — Progress Notes (Signed)
Spoke with patient's son by phone to address any questions that he had.  After further reflection, the patient would like to go ahead and proceed with hernia repair this admission.   Will review case with oncoming surgeon of the week, defer decision of laparoscopic versus open to him in the setting of multiple previous abdominal surgeries, most recently laparoscopic cholecystectomy with primary incisional hernia repairs in July of this year from which she had a prolonged recovery. Patient is already n.p.o. with an NG tube.  Possible surgery tomorrow pending or availability.

## 2021-04-16 NOTE — Consult Note (Addendum)
Consulting Physician: Nickola Major Elton Heid  Referring Provider: Domenic Moras PA-C ER provider  Chief Complaint: Abdominal pain  Reason for Consult: Bowel obstruction   Subjective   HPI: Heather [name] is an 86 y.o. female who is here for abdominal pain.  She had an episode a few weeks ago where she had some abdominal pain and distention and nausea, however it passed.  The symptoms have recurred over the last few days, however this time the symptoms are more severe.  She presented to the emergency room and was given some medication in order to tolerate nasogastric tube placement and now is somewhat sedate in the emergency department.  The history was mostly taken from the patient's daughter.  Past Medical History:  Diagnosis Date   Chronic stable angina [facility])    Coronary artery disease cardiologist--- dr Burt Knack   hx NSTEMI  w/ cardiac cath 03-16-2005 PCI with DES to LCx;   cardiac cath 06-27-2005  PCI with DES to RCA with residual dx LAD manage medcially;  lexiscan 01-26-20211 normal no ischemia, ef 70%;  stress echo w/ dobutamine 10-24-2011 negative ishcmeia , normal ef // Myoview 2/22: EF 67, no ischemia or infarction, no TID, low risk    Diabetes mellitus type 2, diet-controlled ([place])    followed by pcp  (10-19-2020  pt stated checks daily in am,  fasting blood surgar--- 115--120s)   DOE (dyspnea on exertion)    per pt "when I over do",  ok with household chores   Echocardiogram 08/2020    Echocardiogram 2/22: EF 55-60, no RWMA, mild LVH, Gr 2 DD, GLS-21.7%, normal RVSF, trivial MR, RVSP 39.5   Edema of right lower extremity    GERD (gastroesophageal reflux disease)    Hiatal hernia    recurrence,  hx HH repair 1989   History of cervical cancer    s/p  vaginal hysterectomy   History of DVT of lower extremity 2016   11-29-2014 post op right TKA of right lower extremity and completed xarelto    History of esophageal stricture    hx s/p dilatation's   History of gastric ulcer 2005  approx.   History of palpitations 2010   event monitor 07-07-2009 showed NSR w/ freq. SVT ectopies with short runs, rare PVCs   History of TIA (transient ischemic attack) 06/1999   12-15-2019  per pt had several TIA between 12/ 2000 to 02/ 2001 , was sent to specialist '@[facility]' , had test that was normal (10-19-2020 pt stated no TIAs since ) but has residual of essential tremors of right arm/ hand   Hypertension    Intermittent palpitations    IT band syndrome    Migraine    "ice pick headche lasts about 30 seconds"   Mixed hyperlipidemia    Mixed incontinence urge and stress    Multiple thyroid nodules    followed by pcp---   ultrasound 11-22-2014 no bx   (12-15-2019 per pt had a endocrinologist and was told did not need bx)   OA (osteoarthritis)    knees, elbow, hip, ankles   Occasional tremors    right arm/ hand  s/p TIA residual 2000   Osteoporosis    taking vitamin d   Peroneal DVT (deep venous thrombosis) ([place]) 12/22/2014   Right bundle branch block (RBBB) with left anterior fascicular block (LAFB)    RLS (restless legs syndrome)    S/P drug eluting coronary stent placement 2006   03-16-2005  PCI x1 DES to LCx;  06-27-2005  PCI x1 DES to RCA   Urinary retention    post op sling prodecure on 10-27-2020, has foley cathether    Past Surgical History:  Procedure Laterality Date   ANTERIOR AND POSTERIOR REPAIR N/A 12/22/2019   Procedure: ANTERIOR (CYSTOCELE)  REPAIR;  Surgeon: Janyth Contes, MD;  Location: [place];  Service: Gynecology;  Laterality: N/A;   ANTERIOR AND POSTERIOR REPAIR WITH SACROSPINOUS FIXATION N/A 10/27/2020   Procedure: SACROSPINOUS LIGAMENT FIXATION;  Surgeon: Jaquita Folds, MD;  Location: [facility];  Service: Gynecology;  Laterality: N/A;   BLADDER SUSPENSION N/A 10/27/2020   Procedure: TRANSVAGINAL TAPE (TVT) PROCEDURE;  Surgeon: Jaquita Folds, MD;  Location: [facility];  Service:  Gynecology;  Laterality: N/A;   CATARACT EXTRACTION W/ INTRAOCULAR LENS  IMPLANT, BILATERAL  2015   CHOLECYSTECTOMY N/A 01/25/2021   Procedure: LAPAROSCOPIC CHOLECYSTECTOMY WITH INTRAOPERATIVE CHOLANGIOGRAM AND LYSIS OF ADHESIONS;  Surgeon: Michael Boston, MD;  Location: [facility];  Service: General;  Laterality: N/A;   COLONOSCOPY  last one ?   CORONARY ANGIOPLASTY WITH STENT PLACEMENT  03-16-2005   dr Lia Foyer   PCI and DES x1 to LCx   CORONARY ANGIOPLASTY WITH STENT PLACEMENT  06-27-2005  dr Lia Foyer   PCI and DES x1 to RCA with residual disease LAD 70-80% to manage medically   CYSTOSCOPY N/A 10/27/2020   Procedure: CYSTOSCOPY;  Surgeon: Jaquita Folds, MD;  Location: [facility];  Service: Gynecology;  Laterality: N/A;   CYSTOSCOPY N/A 11/09/2020   Procedure: CYSTOSCOPY;  Surgeon: Jaquita Folds, MD;  Location: [facility];  Service: Gynecology;  Laterality: N/A;   FOOT SURGERY Left 1990s   left foot stress fracture repair, per pt no hardware   HIATAL HERNIA REPAIR  1989   INCISIONAL HERNIA REPAIR N/A 01/25/2021   Procedure: PRIMARY REPAIR OF INCISIONAL HERNIA;  Surgeon: Michael Boston, MD;  Location: [facility];  Service: General;  Laterality: N/A;   KNEE ARTHROSCOPY Bilateral right ?/   left x2 , last one 09-12-2009 @ [place]   PUBOVAGINAL SLING N/A 11/09/2020   Procedure: [place];  Surgeon: Jaquita Folds, MD;  Location: [facility];  Service: Gynecology;  Laterality: N/A;   RECTOCELE REPAIR N/A 04/20/2020   Procedure: POSTERIOR REPAIR (RECTOCELE);  Surgeon: Janyth Contes, MD;  Location: [facility];  Service: Gynecology;  Laterality: N/A;   RECTOCELE REPAIR N/A 10/27/2020   Procedure: POSTERIOR REPAIR (RECTOCELE);  Surgeon: Jaquita Folds, MD;  Location: [facility];  Service: Gynecology;  Laterality: N/A;  total time requested for all procedures is 2 hours   TOTAL KNEE  ARTHROPLASTY  11/12/2011   Procedure: TOTAL KNEE ARTHROPLASTY;  Surgeon: Lorn Junes, MD;  Location: [place];  Service: Orthopedics;  Laterality: Left;  Dr Noemi Chapel wants 90 minutes for this case   TOTAL KNEE ARTHROPLASTY Right 11/29/2014   Procedure: RIGHT TOTAL KNEE ARTHROPLASTY;  Surgeon: Vickey Huger, MD;  Location: [place];  Service: Orthopedics;  Laterality: Right;   UPPER GASTROINTESTINAL ENDOSCOPY  last one 04-25-2017   with dilatation esophageal stricture and savary dilatation   VAGINAL HYSTERECTOMY  1988    no ovaries removed for bleeeding    Family History  Problem Relation Age of Onset   Stroke Father        family hx of M 1st degree relative <50   Coronary artery disease Mother    Heart disease Mother  Depression Brother    Stroke Brother    Diabetes Brother    Cancer Brother        bladder with mets   Diabetes Daughter        borderline   Hypertension Daughter    Arthritis Other        family hx of   Hypertension Other        family hx of   Other Other        family hx of cardiovascular disorder   Thyroid disease Daughter    Breast cancer Neg Hx    Colon cancer Neg Hx    Anesthesia problems Neg Hx    Hypotension Neg Hx    Malignant hyperthermia Neg Hx    Pseudochol deficiency Neg Hx    Colon polyps Neg Hx    Esophageal cancer Neg Hx    Rectal cancer Neg Hx    Stomach cancer Neg Hx     Social:  reports that she has never smoked. She has never used smokeless tobacco. She reports that she does not drink alcohol and does not use drugs.  Allergies:  Allergies  Allergen Reactions   Baclofen Other (See Comments)    Hyperactivity    Nitrofurantoin Rash   Tramadol Other (See Comments)    Hallucinations     Medications: Current Outpatient Medications  Medication Instructions   acetaminophen (TYLENOL) 1,000 mg, Oral, Every 6 hours PRN   amLODipine (NORVASC) 10 MG tablet TAKE 1 TABLET EVERY DAY   aspirin EC 81 mg, Oral, Daily   Blood Glucose Monitoring  Suppl (TRUE METRIX AIR GLUCOSE METER) w/Device KIT USE TO CHECK BLOOD SUGAR ONCE DAILY AND AS NEEDED.  DX CODE E11.9   cephALEXin (KEFLEX) 250 mg, Oral, Daily   diclofenac Sodium (VOLTAREN) 1 % GEL 1 application, Topical, 4 times daily PRN   dicyclomine (BENTYL) 20 mg, Oral, 2 times daily PRN   famotidine (PEPCID) 20 mg, Oral, At bedtime PRN   furosemide (LASIX) 20 mg, Oral, Daily   glucose blood (TRUE METRIX BLOOD GLUCOSE TEST) test strip USE TO CHECK BLOOD SUGAR ONCE A DAY OR AS NEEDED.  DX CODE: E11.9   isosorbide mononitrate (IMDUR) 90 mg, Oral, Daily   ketorolac (TORADOL) 10 mg, Oral, Every 6 hours PRN   losartan (COZAAR) 50 mg, Oral, 2 times daily   magnesium oxide (MAG-OX) 400 mg, Oral, Nightly   metoprolol succinate (TOPROL-XL) 25 mg, Oral, Daily   nitroGLYCERIN (NITROSTAT) 0.4 mg, Sublingual, Every 5 min PRN   nystatin (MYCOSTATIN/NYSTOP) powder 1 application, Topical, 3 times daily   omeprazole (PRILOSEC) 20 mg, Oral, Every morning   ondansetron (ZOFRAN ODT) 4 mg, Oral, Every 8 hours PRN, 12m ODT q4 hours prn nausea/vomit   potassium chloride SA (KLOR-CON) 20 MEQ tablet 10 mEq, Oral, Daily   pramipexole (MIRAPEX) 0.25 MG tablet TAKE 1 TABLET TWICE DAILY   simvastatin (ZOCOR) 20 MG tablet TAKE 1 TABLET AT BEDTIME   TRUEplus Lancets 33G MISC USE TO CHECK BLOOD SUGAR ONCE DAILY AND AS NEEDED.  DX CODE: E11.9   Vitamin C 500 mg, Oral, Daily   Vitamin D 2,000 Units, Oral, Daily   Zinc 50 mg, Oral, Daily    ROS - all of the below systems have been reviewed with the patient and positives are indicated with bold text General: chills, fever or night sweats Eyes: blurry vision or double vision ENT: epistaxis or sore throat Allergy/Immunology: itchy/watery eyes or nasal congestion Hematologic/Lymphatic: bleeding problems, blood  clots or swollen lymph nodes Endocrine: temperature intolerance or unexpected weight changes Breast: new or changing breast lumps or nipple discharge Resp:  cough, shortness of breath, or wheezing CV: chest pain or dyspnea on exertion GI: as per HPI GU: dysuria, trouble voiding, or hematuria MSK: joint pain or joint stiffness Neuro: TIA or stroke symptoms Derm: pruritus and skin lesion changes Psych: anxiety and depression  Objective   PE Blood pressure (!) 161/57, pulse 77, temperature 97.8 F (36.6 C), temperature source Oral, resp. rate 16, height '5\' 1"'  (1.549 m), weight 66.7 kg, SpO2 93 %. Constitutional: NAD; conversant; no deformities Eyes: Moist conjunctiva; no lid lag; anicteric; PERRL Neck: Trachea midline; no thyromegaly Lungs: Normal respiratory effort; no tactile fremitus CV: RRR; no palpable thrills; no pitting edema GI: Abd palpable golf ball sized left inguinal hernia, able to be easily reduced once patient is laying in the supine position, recent gallbladder incisions well-healed MSK: Normal range of motion of extremities; no clubbing/cyanosis Psychiatric: Appropriate affect; alert and oriented x3 Lymphatic: No palpable cervical or axillary lymphadenopathy  Results for orders placed or performed during the hospital encounter of 04/15/21 (from the past 24 hour(s))  CBC with Differential     Status: None   Collection Time: 04/15/21  1:17 PM  Result Value Ref Range   WBC 7.6 4.0 - 10.5 K/uL   RBC 4.78 3.87 - 5.11 MIL/uL   Hemoglobin 14.9 12.0 - 15.0 g/dL   HCT 43.3 36.0 - 46.0 %   MCV 90.6 80.0 - 100.0 fL   MCH 31.2 26.0 - 34.0 pg   MCHC 34.4 30.0 - 36.0 g/dL   RDW 13.0 11.5 - 15.5 %   Platelets 192 150 - 400 K/uL   nRBC 0.0 0.0 - 0.2 %   Neutrophils Relative % 72 %   Neutro Abs 5.5 1.7 - 7.7 K/uL   Lymphocytes Relative 16 %   Lymphs Abs 1.2 0.7 - 4.0 K/uL   Monocytes Relative 9 %   Monocytes Absolute 0.7 0.1 - 1.0 K/uL   Eosinophils Relative 3 %   Eosinophils Absolute 0.2 0.0 - 0.5 K/uL   Basophils Relative 0 %   Basophils Absolute 0.0 0.0 - 0.1 K/uL   Immature Granulocytes 0 %   Abs Immature Granulocytes  0.02 0.00 - 0.07 K/uL  Comprehensive metabolic panel     Status: Abnormal   Collection Time: 04/15/21  1:17 PM  Result Value Ref Range   Sodium 137 135 - 145 mmol/L   Potassium 4.2 3.5 - 5.1 mmol/L   Chloride 100 98 - 111 mmol/L   CO2 28 22 - 32 mmol/L   Glucose, Bld 147 (H) 70 - 99 mg/dL   BUN 13 8 - 23 mg/dL   Creatinine, Ser 0.97 0.44 - 1.00 mg/dL   Calcium 9.8 8.9 - 10.3 mg/dL   Total Protein 7.2 6.5 - 8.1 g/dL   Albumin 4.5 3.5 - 5.0 g/dL   AST 21 15 - 41 U/L   ALT 20 0 - 44 U/L   Alkaline Phosphatase 108 38 - 126 U/L   Total Bilirubin 0.6 0.3 - 1.2 mg/dL   GFR, Estimated 58 (L) >60 mL/min   Anion gap 9 5 - 15  Lipase, blood     Status: None   Collection Time: 04/15/21  1:17 PM  Result Value Ref Range   Lipase 41 11 - 51 U/L  Urinalysis, Routine w reflex microscopic Urine, Clean Catch     Status: Abnormal  Collection Time: 04/15/21  1:17 PM  Result Value Ref Range   Color, Urine YELLOW YELLOW   APPearance HAZY (A) CLEAR   Specific Gravity, Urine 1.026 1.005 - 1.030   pH 7.0 5.0 - 8.0   Glucose, UA NEGATIVE NEGATIVE mg/dL   Hgb urine dipstick NEGATIVE NEGATIVE   Bilirubin Urine NEGATIVE NEGATIVE   Ketones, ur NEGATIVE NEGATIVE mg/dL   Protein, ur 30 (A) NEGATIVE mg/dL   Nitrite NEGATIVE NEGATIVE   Leukocytes,Ua NEGATIVE NEGATIVE   RBC / HPF 0-5 0 - 5 RBC/hpf   WBC, UA 0-5 0 - 5 WBC/hpf   Bacteria, UA NONE SEEN NONE SEEN   Squamous Epithelial / LPF 6-10 0 - 5   Mucus PRESENT   Lactic acid, plasma     Status: None   Collection Time: 04/15/21 10:24 PM  Result Value Ref Range   Lactic Acid, Venous 1.3 0.5 - 1.9 mmol/L  Resp Panel by RT-PCR (Flu A&B, Covid) Nasopharyngeal Swab     Status: None   Collection Time: 04/15/21 11:40 PM   Specimen: Nasopharyngeal Swab; Nasopharyngeal(NP) swabs in vial transport medium  Result Value Ref Range   SARS Coronavirus 2 by RT PCR NEGATIVE NEGATIVE   Influenza A by PCR NEGATIVE NEGATIVE   Influenza B by PCR NEGATIVE NEGATIVE      Imaging Orders         CT Abdomen Pelvis W Contrast         DG Abd Acute W/Chest      Assessment and Plan   Heather [name] is an 86 y.o. female with a left inguinal hernia containing small bowel causing a bowel obstruction.  The hernia was able to be reduced at the bedside.  I recommended admission to the medicine service and continued NG tube management until she starts passing gas or having bowel movements.  We will keep an eye on the hernia to ensure it remains reduced.  Hopefully her obstruction resolves and we can deal with this hernia on less urgent terms.  She would like to attend a family wedding this weekend in [place], [place].  Interestingly her granddaughter has been told she has Erlos Danlos syndrome with hypermobility.  It would make sense that a collagen disorder runs through her side of the family as she is dealt with hiatal hernia, ventral hernia, and now this left inguinal hernia.    ICD-10-CM   1. Small bowel obstruction ([place])  K56.609     2. Unilateral inguinal hernia with obstruction and without gangrene, recurrence not specified  K40.30        Felicie Morn, MD  [facility] Use [email or web address] to contact on call provider

## 2021-04-17 ENCOUNTER — Encounter (HOSPITAL_COMMUNITY)
Admission: EM | Disposition: A | Discharge status: Home / Self Care | Payer: Self-pay | Source: Home / Self Care | Attending: Internal Medicine

## 2021-04-17 ENCOUNTER — Inpatient Hospital Stay (HOSPITAL_COMMUNITY): Payer: Medicare HMO | Admitting: Anesthesiology

## 2021-04-17 ENCOUNTER — Encounter (HOSPITAL_COMMUNITY): Payer: Self-pay | Admitting: Internal Medicine

## 2021-04-17 DIAGNOSIS — K56609 Unspecified intestinal obstruction, unspecified as to partial versus complete obstruction: Secondary | ICD-10-CM | POA: Diagnosis not present

## 2021-04-17 HISTORY — PX: INGUINAL HERNIA REPAIR: SHX194

## 2021-04-17 LAB — GLUCOSE, CAPILLARY
Glucose-Capillary: 99 mg/dL (ref 70–99)
Glucose-Capillary: 114 mg/dL — ABNORMAL HIGH (ref 70–99)
Glucose-Capillary: 203 mg/dL — ABNORMAL HIGH (ref 70–99)
Glucose-Capillary: 116 mg/dL — ABNORMAL HIGH (ref 70–99)

## 2021-04-17 SURGERY — REPAIR, HERNIA, INGUINAL, LAPAROSCOPIC
Anesthesia: General | Site: Abdomen | Laterality: Left

## 2021-04-17 MED ORDER — FENTANYL CITRATE PF 50 MCG/ML IJ SOSY: 25.0000 ug | PREFILLED_SYRINGE | INTRAMUSCULAR | Status: DC | PRN

## 2021-04-17 MED ORDER — HYDRALAZINE HCL 20 MG/ML IJ SOLN
INTRAMUSCULAR | Status: DC | PRN
  Administered 2021-04-17: 5 mg via INTRAVENOUS

## 2021-04-17 MED ORDER — ACETAMINOPHEN 325 MG PO TABS
650.0000 mg | ORAL_TABLET | Freq: Four times a day (QID) | ORAL | Status: DC | PRN
  Administered 2021-04-17: 650 mg via ORAL
  Filled 2021-04-17 (×2): qty 2

## 2021-04-17 MED ORDER — ONDANSETRON HCL 4 MG/2ML IJ SOLN
INTRAMUSCULAR | Status: AC
  Filled 2021-04-17: qty 2

## 2021-04-17 MED ORDER — DEXAMETHASONE SODIUM PHOSPHATE 10 MG/ML IJ SOLN
INTRAMUSCULAR | Status: DC | PRN
  Administered 2021-04-17: 7 mg via INTRAVENOUS

## 2021-04-17 MED ORDER — ONDANSETRON HCL 4 MG/2ML IJ SOLN
INTRAMUSCULAR | Status: DC | PRN
  Administered 2021-04-17: 4 mg via INTRAVENOUS

## 2021-04-17 MED ORDER — PROPOFOL 10 MG/ML IV BOLUS
INTRAVENOUS | Status: DC | PRN
  Administered 2021-04-17: 120 mg via INTRAVENOUS
  Administered 2021-04-17: 20 mg via INTRAVENOUS

## 2021-04-17 MED ORDER — HYDROCODONE-ACETAMINOPHEN 5-325 MG PO TABS: 1.0000 | ORAL_TABLET | ORAL | Status: DC | PRN

## 2021-04-17 MED ORDER — SUCCINYLCHOLINE CHLORIDE 200 MG/10ML IV SOSY
PREFILLED_SYRINGE | INTRAVENOUS | Status: AC
  Filled 2021-04-17: qty 10

## 2021-04-17 MED ORDER — MIDAZOLAM HCL 5 MG/5ML IJ SOLN
INTRAMUSCULAR | Status: DC | PRN
Start: 2021-04-17 — End: 2021-04-17
  Administered 2021-04-17: .5 mg via INTRAVENOUS

## 2021-04-17 MED ORDER — LIDOCAINE 2% (20 MG/ML) 5 ML SYRINGE
INTRAMUSCULAR | Status: DC | PRN
Start: 2021-04-17 — End: 2021-04-17
  Administered 2021-04-17: 100 mg via INTRAVENOUS

## 2021-04-17 MED ORDER — LIDOCAINE HCL (PF) 2 % IJ SOLN
INTRAMUSCULAR | Status: AC
  Filled 2021-04-17: qty 5

## 2021-04-17 MED ORDER — AMISULPRIDE (ANTIEMETIC) 5 MG/2ML IV SOLN
INTRAVENOUS | Status: AC
  Administered 2021-04-17: 10 mg via INTRAVENOUS
  Filled 2021-04-17: qty 4

## 2021-04-17 MED ORDER — ARTIFICIAL TEARS OPHTHALMIC OINT
TOPICAL_OINTMENT | OPHTHALMIC | Status: AC
  Filled 2021-04-17: qty 3.5

## 2021-04-17 MED ORDER — ROCURONIUM BROMIDE 10 MG/ML (PF) SYRINGE
PREFILLED_SYRINGE | INTRAVENOUS | Status: AC
  Filled 2021-04-17: qty 10

## 2021-04-17 MED ORDER — PHENYLEPHRINE 40 MCG/ML (10ML) SYRINGE FOR IV PUSH (FOR BLOOD PRESSURE SUPPORT)
PREFILLED_SYRINGE | INTRAVENOUS | Status: AC
  Filled 2021-04-17: qty 10

## 2021-04-17 MED ORDER — MIDAZOLAM HCL 2 MG/2ML IJ SOLN
INTRAMUSCULAR | Status: AC
  Filled 2021-04-17: qty 2

## 2021-04-17 MED ORDER — DEXTROSE-NACL 5-0.45 % IV SOLN: INTRAVENOUS | Status: DC

## 2021-04-17 MED ORDER — FENTANYL CITRATE (PF) 100 MCG/2ML IJ SOLN
INTRAMUSCULAR | Status: DC | PRN
  Administered 2021-04-17 (×2): 50 ug via INTRAVENOUS

## 2021-04-17 MED ORDER — DEXAMETHASONE SODIUM PHOSPHATE 10 MG/ML IJ SOLN
INTRAMUSCULAR | Status: AC
  Filled 2021-04-17: qty 1

## 2021-04-17 MED ORDER — CEFAZOLIN SODIUM-DEXTROSE 2-3 GM-%(50ML) IV SOLR
INTRAVENOUS | Status: DC | PRN
  Administered 2021-04-17: 2 g via INTRAVENOUS

## 2021-04-17 MED ORDER — OXYCODONE HCL 5 MG/5ML PO SOLN: 5.0000 mg | Freq: Once | ORAL | Status: DC | PRN

## 2021-04-17 MED ORDER — SUCCINYLCHOLINE CHLORIDE 200 MG/10ML IV SOSY
PREFILLED_SYRINGE | INTRAVENOUS | Status: DC | PRN
  Administered 2021-04-17: 120 mg via INTRAVENOUS

## 2021-04-17 MED ORDER — LACTATED RINGERS IV SOLN: INTRAVENOUS | Status: DC

## 2021-04-17 MED ORDER — PROPOFOL 10 MG/ML IV BOLUS
INTRAVENOUS | Status: AC
  Filled 2021-04-17: qty 20

## 2021-04-17 MED ORDER — BUPIVACAINE-EPINEPHRINE (PF) 0.25% -1:200000 IJ SOLN
INTRAMUSCULAR | Status: AC
  Filled 2021-04-17: qty 30

## 2021-04-17 MED ORDER — SUGAMMADEX SODIUM 200 MG/2ML IV SOLN
INTRAVENOUS | Status: DC | PRN
  Administered 2021-04-17: 200 mg via INTRAVENOUS

## 2021-04-17 MED ORDER — AMISULPRIDE (ANTIEMETIC) 5 MG/2ML IV SOLN: 10.0000 mg | Freq: Once | INTRAVENOUS | Status: AC | PRN

## 2021-04-17 MED ORDER — 0.9 % SODIUM CHLORIDE (POUR BTL) OPTIME
TOPICAL | Status: DC | PRN
  Administered 2021-04-17: 1000 mL

## 2021-04-17 MED ORDER — ONDANSETRON 4 MG PO TBDP: 4.0000 mg | ORAL_TABLET | Freq: Four times a day (QID) | ORAL | Status: DC | PRN

## 2021-04-17 MED ORDER — CHLORHEXIDINE GLUCONATE 0.12 % MT SOLN
15.0000 mL | OROMUCOSAL | Status: AC
  Administered 2021-04-17: 15 mL via OROMUCOSAL

## 2021-04-17 MED ORDER — OXYCODONE HCL 5 MG PO TABS: 5.0000 mg | ORAL_TABLET | Freq: Once | ORAL | Status: DC | PRN

## 2021-04-17 MED ORDER — BUPIVACAINE-EPINEPHRINE 0.25% -1:200000 IJ SOLN
INTRAMUSCULAR | Status: DC | PRN
  Administered 2021-04-17: 5 mL

## 2021-04-17 MED ORDER — ONDANSETRON HCL 4 MG/2ML IJ SOLN: 4.0000 mg | Freq: Four times a day (QID) | INTRAMUSCULAR | Status: DC | PRN

## 2021-04-17 MED ORDER — ONDANSETRON HCL 4 MG/2ML IJ SOLN: 4.0000 mg | Freq: Once | INTRAMUSCULAR | Status: DC | PRN

## 2021-04-17 MED ORDER — ROCURONIUM BROMIDE 10 MG/ML (PF) SYRINGE
PREFILLED_SYRINGE | INTRAVENOUS | Status: DC | PRN
  Administered 2021-04-17: 35 mg via INTRAVENOUS

## 2021-04-17 MED ORDER — CEFAZOLIN SODIUM-DEXTROSE 2-4 GM/100ML-% IV SOLN
INTRAVENOUS | Status: AC
  Filled 2021-04-17: qty 100

## 2021-04-17 MED ORDER — FENTANYL CITRATE (PF) 250 MCG/5ML IJ SOLN
INTRAMUSCULAR | Status: AC
  Filled 2021-04-17: qty 5

## 2021-04-17 SURGICAL SUPPLY — 49 items
ADH SKN CLS APL DERMABOND .7 (GAUZE/BANDAGES/DRESSINGS) ×1
APL PRP STRL LF DISP 70% ISPRP (MISCELLANEOUS) ×1
APPLIER CLIP 5 13 M/L LIGAMAX5 (MISCELLANEOUS)
APR CLP MED LRG 5 ANG JAW (MISCELLANEOUS)
CABLE HIGH FREQUENCY MONO STRZ (ELECTRODE) ×2 IMPLANT
CATH FOLEY 3WAY  5CC 16FR (CATHETERS)
CATH FOLEY 3WAY 5CC 16FR (CATHETERS) ×1 IMPLANT
CHLORAPREP W/TINT 26 (MISCELLANEOUS) ×2 IMPLANT
CLIP APPLIE 5 13 M/L LIGAMAX5 (MISCELLANEOUS) IMPLANT
DECANTER SPIKE VIAL GLASS SM (MISCELLANEOUS) ×2 IMPLANT
DERMABOND ADVANCED (GAUZE/BANDAGES/DRESSINGS) ×1
DERMABOND ADVANCED .7 DNX12 (GAUZE/BANDAGES/DRESSINGS) ×1 IMPLANT
DISSECTOR BLUNT TIP ENDO 5MM (MISCELLANEOUS) IMPLANT
ELECT REM PT RETURN 15FT ADLT (MISCELLANEOUS) ×2 IMPLANT
ENDOLOOP SUT PDS II  0 18 (SUTURE) ×2
ENDOLOOP SUT PDS II 0 18 (SUTURE) IMPLANT
GLOVE SURG ENC MOIS LTX SZ7.5 (GLOVE) ×2 IMPLANT
GOWN STRL REUS W/TWL XL LVL3 (GOWN DISPOSABLE) ×4 IMPLANT
IRRIG SUCT STRYKERFLOW 2 WTIP (MISCELLANEOUS)
IRRIGATION SUCT STRKRFLW 2 WTP (MISCELLANEOUS) IMPLANT
KIT BASIN OR (CUSTOM PROCEDURE TRAY) ×2 IMPLANT
KIT TURNOVER KIT A (KITS) ×2 IMPLANT
MESH 3DMAX 4X6 LT LRG (Mesh General) ×1 IMPLANT
NDL INSUFFLATION 14GA 120MM (NEEDLE) IMPLANT
NEEDLE INSUFFLATION 14GA 120MM (NEEDLE) ×2 IMPLANT
PLUG CATH AND CAP STER (CATHETERS) ×1 IMPLANT
POUCH LAPAROSCOPIC INSTRUMENT (MISCELLANEOUS) ×2 IMPLANT
PROTECTOR NERVE ULNAR (MISCELLANEOUS) IMPLANT
RELOAD STAPLE 4.0 BLU F/HERNIA (INSTRUMENTS) ×1 IMPLANT
RELOAD STAPLE 4.8 BLK F/HERNIA (STAPLE) IMPLANT
RELOAD STAPLE HERNIA 4.0 BLUE (INSTRUMENTS) ×2 IMPLANT
RELOAD STAPLE HERNIA 4.8 BLK (STAPLE) IMPLANT
SCISSORS LAP 5X35 DISP (ENDOMECHANICALS) IMPLANT
SET IRRIG Y TYPE TUR BLADDER L (SET/KITS/TRAYS/PACK) ×1 IMPLANT
SET TUBE SMOKE EVAC HIGH FLOW (TUBING) ×2 IMPLANT
STAPLER HERNIA 12 8.5 360D (INSTRUMENTS) ×2 IMPLANT
SUT MNCRL AB 4-0 PS2 18 (SUTURE) ×2 IMPLANT
SUT VIC AB 1 CT1 27 (SUTURE)
SUT VIC AB 1 CT1 27XBRD ANBCTR (SUTURE) IMPLANT
SYR TOOMEY IRRIG 70ML (MISCELLANEOUS)
SYRINGE TOOMEY IRRIG 70ML (MISCELLANEOUS) ×1 IMPLANT
TOWEL OR 17X26 10 PK STRL BLUE (TOWEL DISPOSABLE) ×2 IMPLANT
TOWEL OR NON WOVEN STRL DISP B (DISPOSABLE) ×2 IMPLANT
TRAY FOLEY MTR SLVR 14FR STAT (SET/KITS/TRAYS/PACK) ×2 IMPLANT
TRAY LAPAROSCOPIC (CUSTOM PROCEDURE TRAY) ×2 IMPLANT
TROCAR OPTICAL SHORT 5MM (TROCAR) IMPLANT
TROCAR OPTICAL SLV SHORT 5MM (TROCAR) ×2 IMPLANT
TROCAR XCEL 12X100 BLDLESS (ENDOMECHANICALS) ×2 IMPLANT
WARMER LAPAROSCOPE (MISCELLANEOUS) ×2 IMPLANT

## 2021-04-17 NOTE — Transfer of Care (Signed)
Immediate Anesthesia Transfer of Care Note  Patient: Heather [name]  Procedure(s) Performed: LAPAROSCOPIC LEFT INGUINAL HERNIA REPAIR WITH MESH (Left: Abdomen)  Patient Location: PACU  Anesthesia Type:General  Level of Consciousness: awake, alert , oriented and patient cooperative  Airway & Oxygen Therapy: Patient Spontanous Breathing and Patient connected to face mask oxygen  Post-op Assessment: Report given to RN, Post -op Vital signs reviewed and stable and Patient moving all extremities X 4  Post vital signs: stable  Last Vitals:  Vitals Value Taken Time  BP 159/60 04/17/21 1130  Temp 36.6 C 04/17/21 1128  Pulse 78 04/17/21 1134  Resp 20 04/17/21 1134  SpO2 100 % 04/17/21 1134  Vitals shown include unvalidated device data.  Last Pain:  Vitals:   04/17/21 0945  TempSrc:   PainSc: 0-No pain         Complications: No notable events documented.

## 2021-04-17 NOTE — Discharge Instructions (Signed)
[facility] _______[facility] ? ?INGUINAL HERNIA REPAIR: POST OP INSTRUCTIONS ? ?Always review your discharge instruction sheet given to you by the facility where your surgery was performed. ?IF YOU HAVE DISABILITY OR FAMILY LEAVE FORMS, YOU MUST BRING THEM TO THE OFFICE FOR PROCESSING.   ?DO NOT GIVE THEM TO YOUR DOCTOR. ? ?1. A  prescription for pain medication may be given to you upon discharge.  Take your pain medication as prescribed, if needed.  If narcotic pain medicine is not needed, then you may take acetaminophen (Tylenol) or ibuprofen (Advil) as needed. ?2. Take your usually prescribed medications unless otherwise directed. ?If you need a refill on your pain medication, please contact your pharmacy.  They will contact our office to request authorization. Prescriptions will not be filled after 5 pm or on week-ends. ?3. You should follow a light diet the first 24 hours after arrival home, such as soup and crackers, etc.  Be sure to include lots of fluids daily.  Resume your normal diet the day after surgery. ?4.Most patients will experience some swelling and bruising around the umbilicus or in the groin and scrotum.  Ice packs and reclining will help.  Swelling and bruising can take several days to resolve.  ?6. It is common to experience some constipation if taking pain medication after surgery.  Increasing fluid intake and taking a stool softener (such as Colace) will usually help or prevent this problem from occurring.  A mild laxative (Milk of Magnesia or Miralax) should be taken according to package directions if there are no bowel movements after 48 hours. ?7. Unless discharge instructions indicate otherwise, you may remove your bandages 24-48 hours after surgery, and you may shower at that time.  You may have steri-strips (small skin tapes) in place directly over the incision.  These strips should be left on the skin for 7-10 days.  If your surgeon used skin glue on the incision, you may  shower in 24 hours.  The glue will flake off over the next 2-3 weeks.  Any sutures or staples will be removed at the office during your follow-up visit. ?8. ACTIVITIES:  You may resume regular (light) daily activities beginning the next day--such as daily self-care, walking, climbing stairs--gradually increasing activities as tolerated.  You may have sexual intercourse when it is comfortable.  Refrain from any heavy lifting or straining until approved by your doctor. ? ?a.You may drive when you are no longer taking prescription pain medication, you can comfortably wear a seatbelt, and you can safely maneuver your car and apply brakes. ?b.RETURN TO WORK:   ?_____________________________________________ ? ?9.You should see your doctor in the office for a follow-up appointment approximately 2-3 weeks after your surgery.  Make sure that you call for this appointment within a day or two after you arrive home to insure a convenient appointment time. ?10.OTHER INSTRUCTIONS: _________________________ ?   _____________________________________ ? ?WHEN TO CALL YOUR DOCTOR: ?Fever over 101.0 ?Inability to urinate ?Nausea and/or vomiting ?Extreme swelling or bruising ?Continued bleeding from incision. ?Increased pain, redness, or drainage from the incision ? ?The clinic staff is available to answer your questions during regular business hours.  Please don?t hesitate to call and ask to speak to one of the nurses for clinical concerns.  If you have a medical emergency, go to the nearest emergency room or call 911.  A surgeon from [facility] is always on call at the hospital ? ? ?[address], [address], [place], [place]    [address] ? ? [address] [address], [place], [place]   [address] ?[phone] ? [phone] ? FAX [phone] ?Web site: [email or web address] ? ?

## 2021-04-17 NOTE — Anesthesia Procedure Notes (Signed)
Procedure Name: Intubation Date/Time: 04/17/2021 1:06 PM Performed by: Lissa Morales, CRNA Pre-anesthesia Checklist: Patient identified, Emergency Drugs available, Suction available and Patient being monitored Patient Re-evaluated:Patient Re-evaluated prior to induction Oxygen Delivery Method: Circle system utilized Preoxygenation: Pre-oxygenation with 100% oxygen Induction Type: IV induction, Rapid sequence and Cricoid Pressure applied Laryngoscope Size: Glidescope and 3 Grade View: Grade II Tube type: Oral Tube size: 7.0 mm Number of attempts: 1 Airway Equipment and Method: Stylet and Oral airway Placement Confirmation: ETT inserted through vocal cords under direct vision, positive ETCO2 and breath sounds checked- equal and bilateral Secured at: 22 cm Tube secured with: Tape Dental Injury: Teeth and Oropharynx as per pre-operative assessment  Difficulty Due To: Difficulty was anticipated Comments: Pt stated she was told she was a difficult airway, easy with glidescope and past intubations,protruding teeth, small chin , MAP 3.

## 2021-04-17 NOTE — Addendum Note (Signed)
Addendum  created 04/17/21 1443 by Lissa Morales, CRNA   Flowsheet accepted, Intraprocedure Meds edited

## 2021-04-17 NOTE — Anesthesia Preprocedure Evaluation (Addendum)
Anesthesia Evaluation  Patient identified by MRN, date of birth, ID band Patient awake    Reviewed: Allergy & Precautions, NPO status , Patient's Chart, lab work & pertinent test results, reviewed documented beta blocker date and time   History of Anesthesia Complications Negative for: history of anesthetic complications  Airway Mallampati: III  TM Distance: >3 FB Neck ROM: Full    Dental  (+) Teeth Intact, Dental Advisory Given   Pulmonary neg pulmonary ROS,    Pulmonary exam normal        Cardiovascular hypertension, Pt. on medications and Pt. on home beta blockers + CAD, + Cardiac Stents, +CHF, + DOE and + DVT  Normal cardiovascular exam   TTE 01/29/21: EF 60-65%, normal RV function, no hemodynamically significant valve dysfunction  Normal stress test 08/31/20    Neuro/Psych TIAnegative psych ROS   GI/Hepatic Neg liver ROS, hiatal hernia, PUD, GERD  ,INCARCERATED LEFT INGUINAL HERNIA H/o esophageal stricture s/p dilation   Endo/Other  diabetes, Type 2  Renal/GU negative Renal ROS  negative genitourinary   Musculoskeletal  (+) Arthritis ,   Abdominal   Peds  Hematology negative hematology ROS (+)   Anesthesia Other Findings   Reproductive/Obstetrics                           Anesthesia Physical  Anesthesia Plan  ASA: 3 and emergent  Anesthesia Plan: General   Post-op Pain Management:    Induction: Intravenous, Rapid sequence and Cricoid pressure planned  PONV Risk Score and Plan: 4 or greater and Ondansetron, Dexamethasone and Treatment may vary due to age or medical condition  Airway Management Planned: Oral ETT  Additional Equipment: None  Intra-op Plan:   Post-operative Plan: Extubation in OR  Informed Consent: I have reviewed the patients History and Physical, chart, labs and discussed the procedure including the risks, benefits and alternatives for the proposed  anesthesia with the patient or authorized representative who has indicated his/her understanding and acceptance.     Dental advisory given  Plan Discussed with:   Anesthesia Plan Comments:        Anesthesia Quick Evaluation

## 2021-04-17 NOTE — Progress Notes (Signed)
Daughter at [place]  WIO:[id number] DOB: 11-05-1934 DOA: 04/15/2021 PCP: Mosie Lukes, MD   Brief Narrative:  Heather [name] is a 86 y.o. female with medical history significant of patient with history of coronary artery disease status post cath multiple times and PCI, diabetes, dyspnea on exertion, GERD, history of cervical cancer with vaginal hysterectomy done, osteoarthritis, essential hypertension status postcholecystectomy who presented to the ER yesterday with abdominal pain.  She has had overactive bladder following up with OB/GYN.  She was seen last night again discharge early in the morning per her request after symptoms of abdominal pain and nausea resolved.  Presented back to the ED within 24 hours for worsening symptoms and appears to have a left inguinal hernia containing small bowel.  This has been manually reduced now by surgery.  Patient however is being admitted to the medical service for observation due to multiple medical problems.  Assessment & Plan:   Small bowel obstruction due to inguinal hernia:  -Lengthy discussion with family patient and general surgery, everyone is in agreement to move forward with hernia repair 04/17/2021 -Postoperatively NG tube removed, advance diet as tolerated -Pending postsurgical recovery likely discharge in next 24 to 48 hours pending symptoms, pain control and p.o. intake  Hypertension, essential -Currently well controlled, continue to hold home medications given normotensive at this time, daughter interested in weaning off medications as possible prior to discharge which is certainly reasonable  None insulin-dependent diabetes, borderline uncontrolled with hyperglycemia -Continue sliding scale insulin, follow for hypoglycemia control perioperatively -A1c 6.4   Hyperlipidemia: Resume statin at discharge.   Chronic diastolic heart failure:  -Continue to follow clinically, avoid aggressive IV fluids and volume  overload   Coronary artery disease: Chronic, no further work-up indicated at this time  Urinary retention/chronic recurrent UTI, POA Continue home keflex and antispasmodics  DVT prophylaxis: Lovenox Code Status: Full Family Communication: Daughter at bedside  Status is: Inpatient  Dispo: The patient is from: Home              Anticipated d/c is to: Home              Anticipated d/c date is: 24 to 48 hours pending clinical course and postoperative recovery              Patient currently not medically stable for discharge  Consultants:  Surgery  Procedures:  None planned  Antimicrobials:  Not indicated  Subjective: No acute issues/events overnight, pain well controlled.  Tolerated procedure quite well this morning denies nausea vomiting diarrhea constipation headache fevers chills or chest pain  Objective: Vitals:   04/16/21 1208 04/16/21 1617 04/16/21 2056 04/17/21 0450  BP: (!) 145/54 (!) 145/59 (!) 140/54 (!) 148/59  Pulse: 61 66 65 62  Resp: 15 15 12 16   Temp: 98.1 F (36.7 C) 98.4 F (36.9 C) 99 F (37.2 C) 98.5 F (36.9 C)  TempSrc: Oral Oral Oral Oral  SpO2: 94% 95% 95% 96%  Weight:      Height:        Intake/Output Summary (Last 24 hours) at 04/17/2021 0801 Last data filed at 04/17/2021 0752 Gross per 24 hour  Intake 270.62 ml  Output 800 ml  Net -529.38 ml    Filed Weights   04/15/21 1245  Weight: 66.7 kg    Examination:  General exam: Appears calm and comfortable  Respiratory system: Clear to auscultation. Respiratory effort normal. Cardiovascular system: S1 & S2 heard, RRR.  No JVD, murmurs, rubs, gallops or clicks. No pedal edema. Gastrointestinal system: Abdomen is nondistended, soft and nontender. No organomegaly or masses felt. Normal bowel sounds heard. Central nervous system: Alert and oriented. No focal neurological deficits. Extremities: Symmetric 5 x 5 power. Skin: No rashes, lesions or ulcers Psychiatry: Judgement and insight  appear normal. Mood & affect appropriate.   Data Reviewed: I have personally reviewed following labs and imaging studies  CBC: Recent Labs  Lab 04/14/21 2100 04/15/21 1317 04/16/21 0500  WBC 6.7 7.6 7.4  NEUTROABS 4.2 5.5  --   HGB 13.5 14.9 13.0  HCT 41.4 43.3 40.7  MCV 89.8 90.6 94.7  PLT 164 192 426    Basic Metabolic Panel: Recent Labs  Lab 04/10/21 1118 04/14/21 2100 04/15/21 1317 04/16/21 0500  NA 139 135 137 141  K 4.3 3.7 4.2 5.0  CL 101 99 100 104  CO2 24 26 28 25   GLUCOSE 135* 129* 147* 103*  BUN 22 15 13 15   CREATININE 0.90 0.76 0.97 0.73  CALCIUM 9.6 9.4 9.8 9.0    GFR: Estimated Creatinine Clearance: 45.8 mL/min (by C-G formula based on SCr of 0.73 mg/dL). Liver Function Tests: Recent Labs  Lab 04/14/21 2100 04/15/21 1317 04/16/21 0500  AST 18 21 26   ALT 19 20 13   ALKPHOS 96 108 79  BILITOT 0.6 0.6 1.0  PROT 7.1 7.2 5.7*  ALBUMIN 4.6 4.5 3.4*    Recent Labs  Lab 04/15/21 1317  LIPASE 41    No results for input(s): AMMONIA in the last 168 hours. Coagulation Profile: No results for input(s): INR, PROTIME in the last 168 hours. Cardiac Enzymes: No results for input(s): CKTOTAL, CKMB, CKMBINDEX, TROPONINI in the last 168 hours. BNP (last 3 results) Recent Labs    08/19/20 1351  PROBNP 428    HbA1C: Recent Labs    04/16/21 0500  HGBA1C 6.4*   CBG: Recent Labs  Lab 04/16/21 0755 04/16/21 1142 04/16/21 1626 04/16/21 2054 04/17/21 0726  GLUCAP 104* 94 131* 106* 99   Lipid Profile: No results for input(s): CHOL, HDL, LDLCALC, TRIG, CHOLHDL, LDLDIRECT in the last 72 hours. Thyroid Function Tests: No results for input(s): TSH, T4TOTAL, FREET4, T3FREE, THYROIDAB in the last 72 hours. Anemia Panel: No results for input(s): VITAMINB12, FOLATE, FERRITIN, TIBC, IRON, RETICCTPCT in the last 72 hours. Sepsis Labs: Recent Labs  Lab 04/15/21 2224  LATICACIDVEN 1.3     Recent Results (from the past 240 hour(s))  Resp Panel by  RT-PCR (Flu A&B, Covid) Nasopharyngeal Swab     Status: None   Collection Time: 04/15/21 11:40 PM   Specimen: Nasopharyngeal Swab; Nasopharyngeal(NP) swabs in vial transport medium  Result Value Ref Range Status   SARS Coronavirus 2 by RT PCR NEGATIVE NEGATIVE Final    Comment: (NOTE) SARS-CoV-2 target nucleic acids are NOT DETECTED.  The SARS-CoV-2 RNA is generally detectable in upper respiratory specimens during the acute phase of infection. The lowest concentration of SARS-CoV-2 viral copies this assay can detect is 138 copies/mL. A negative result does not preclude SARS-Cov-2 infection and should not be used as the sole basis for treatment or other patient management decisions. A negative result may occur with  improper specimen collection/handling, submission of specimen other than nasopharyngeal swab, presence of viral mutation(s) within the areas targeted by this assay, and inadequate number of viral copies(<138 copies/mL). A negative result must be combined with clinical observations, patient history, and epidemiological information. The expected result is Negative.  Fact Sheet  for Patients:  EntrepreneurPulse.com.au  Fact Sheet for Healthcare Providers:  IncredibleEmployment.be  This test is no t yet approved or cleared by the [place] FDA and  has been authorized for detection and/or diagnosis of SARS-CoV-2 by FDA under an Emergency Use Authorization (EUA). This EUA will remain  in effect (meaning this test can be used) for the duration of the COVID-19 declaration under Section 564(b)(1) of the Act, 21 U.S.C.section 360bbb-3(b)(1), unless the authorization is terminated  or revoked sooner.       Influenza A by PCR NEGATIVE NEGATIVE Final   Influenza B by PCR NEGATIVE NEGATIVE Final    Comment: (NOTE) The Xpert Xpress SARS-CoV-2/FLU/RSV plus assay is intended as an aid in the diagnosis of influenza from Nasopharyngeal swab  specimens and should not be used as a sole basis for treatment. Nasal washings and aspirates are unacceptable for Xpert Xpress SARS-CoV-2/FLU/RSV testing.  Fact Sheet for Patients: EntrepreneurPulse.com.au  Fact Sheet for Healthcare Providers: IncredibleEmployment.be  This test is not yet approved or cleared by the [place] FDA and has been authorized for detection and/or diagnosis of SARS-CoV-2 by FDA under an Emergency Use Authorization (EUA). This EUA will remain in effect (meaning this test can be used) for the duration of the COVID-19 declaration under Section 564(b)(1) of the Act, 21 U.S.C. section 360bbb-3(b)(1), unless the authorization is terminated or revoked.  Performed at [facility], [place] [address], [place], [place] [address]           Radiology Studies: CT Abdomen Pelvis W Contrast  Result Date: 04/15/2021 CLINICAL DATA:  Bowel obstructed cecectomy. Upper abdominal pain today. EXAM: CT ABDOMEN AND PELVIS WITH CONTRAST TECHNIQUE: Multidetector CT imaging of the abdomen and pelvis was performed using the standard protocol following bolus administration of intravenous contrast. CONTRAST:  54mL OMNIPAQUE IOHEXOL 350 MG/ML SOLN COMPARISON:  04/14/2021 FINDINGS: Lower chest: Calcified granuloma in the right lung. Large esophageal hiatal hernia. Wall thickening in the visualized distal esophagus likely representing reflux disease. Coronary artery calcifications. Hepatobiliary: No focal liver abnormality is seen. Status post cholecystectomy. No biliary dilatation. Pancreas: Unremarkable. No pancreatic ductal dilatation or surrounding inflammatory changes. Spleen: Normal in size without focal abnormality. Adrenals/Urinary Tract: No adrenal gland nodules. Renal nephrograms are symmetrical and homogeneous. Bilateral renal parenchymal lobulation or scarring. Cyst on the right kidney. No hydronephrosis or hydroureter.  Bladder is filled with contrast material. No bladder wall thickening. Gas in the bladder may arise from instrumentation or cystitis. Stomach/Bowel: Stomach is fluid-filled with mild distention. There is a left inguinal hernia containing small bowel. Proximal to the hernia, there is dilatation of fluid-filled small bowel with decompression of the bowel distal to the hernia, consistent with small bowel obstruction. This is developing since the previous study. Colon is not abnormally distended. Scattered stool throughout the colon. Sigmoid colonic diverticulosis without evidence of acute diverticulitis. Appendix is normal. Vascular/Lymphatic: Diffuse calcification of the aorta. No aneurysm. No significant lymphadenopathy. Reproductive: Uterus is surgically absent. No abnormal pelvic masses. Other: No free air or free fluid in the abdomen. Multiple anterior abdominal wall hernias containing fat. These are likely postoperative. Musculoskeletal: Degenerative changes in the spine. No destructive bone lesions. IMPRESSION: 1. Left inguinal hernia containing small bowel. Since the previous study, there is interval development of small bowel obstruction at the level of the hernia. Distal small bowel are decompressed. 2. Large esophageal hiatal hernia. Wall thickening of the distal esophagus suggesting reflux disease. 3. Diffuse aortic atherosclerosis. 4. Diverticulosis of the sigmoid colon without  evidence of acute diverticulitis. 5. Gas in the bladder could indicate instrumentation or infection. 6. Anterior abdominal wall hernias containing fat, likely postsurgical. Electronically Signed   By: Lucienne Capers M.D.   On: 04/15/2021 23:18   DG Abd Acute W/Chest  Result Date: 04/15/2021 CLINICAL DATA:  Upper abdominal pain with nausea and vomiting. EXAM: DG ABDOMEN ACUTE WITH 1 VIEW CHEST COMPARISON:  None. FINDINGS: Small hiatal hernia with air-fluid level. Lungs are clear. No pleural effusion. No free intraperitoneal air.  No dilated loops of bowel. IMPRESSION: 1. Small hiatal hernia. No acute cardiopulmonary disease. 2. Nonobstructive bowel gas pattern. Electronically Signed   By: Ulyses Jarred M.D.   On: 04/15/2021 22:36        Scheduled Meds:  cephALEXin  250 mg Oral Daily   enoxaparin (LOVENOX) injection  40 mg Subcutaneous Q24H   influenza vaccine adjuvanted  0.5 mL Intramuscular Tomorrow-1000   insulin aspart  0-15 Units Subcutaneous TID WC   insulin aspart  0-5 Units Subcutaneous QHS   magnesium oxide  400 mg Oral Daily   pramipexole  0.25 mg Oral BID   Continuous Infusions:  lactated ringers 125 mL/hr at 04/17/21 0223     LOS: 1 day   Time spent: 43min   Yosselyn Tax C Chiyoko Torrico, DO [facility]  If 7PM-7AM, please contact night-coverage [email or web address]  04/17/2021, 8:01 AM

## 2021-04-17 NOTE — Op Note (Signed)
04/17/2021  11:08 AM  PATIENT:  Heather [name]  86 y.o. female  PRE-OPERATIVE DIAGNOSIS:  incarcerated left inguinal hernia  POST-OPERATIVE DIAGNOSIS:  incarcerated left, indirect inguinal hernia  PROCEDURE:  Procedure(s): LAPAROSCOPIC LEFT INGUINAL HERNIA REPAIR WITH MESH (Left)  SURGEON:  Surgeon(s) and Role:    * Ralene Ok, MD - Primary  PHYSICIAN ASSISTANT:   ASSISTANTS: none   ANESTHESIA:   local and general  EBL:  minimal   BLOOD ADMINISTERED:none  DRAINS: none   LOCAL MEDICATIONS USED:  BUPIVICAINE   SPECIMEN:  No Specimen  DISPOSITION OF SPECIMEN:  N/A  COUNTS:  YES  TOURNIQUET:  * No tourniquets in log *  DICTATION: .Dragon Dictation Counts: reported as correct x 2   Findings:  The patient had a moderate sized left indirect hernia    Indications for procedure:  The patient is a 86 year old female with a left inguinal hernia  with incarceration of small bowel and obstruction.   Details of the procedure: The patient was taken back to the operating room. The patient was placed in supine position with bilateral SCDs in place.  The patient was prepped and draped in the usual sterile fashion.  After appropriate anitbiotics were confirmed, a time-out was confirmed and all facts were verified.   0.25% Marcaine was used to infiltrate the umbilical area. A 11-blade was used to cut down the skin and blunt dissection was used to get the anterior fashion.  The anterior fascia was incised approximately 1 cm and the muscles were retracted laterally. Blunt dissection was then used to create a space in the preperitoneal area. At this time a 10 mm camera was then introduced into the space and advanced the pubic tubercle and a 12 mm trocar was placed over this and insufflation was started.  At this time and space was created from medial to laterally the preperitoneal space.  Cooper's ligament was initially cleaned off.  The hernia sac was identified in the indirect space.  Dissection of the hernia sac and round ligament was transected with cautery.  Once the hernia sac was taken down to approximately the umbilicus a Left Bard 3D Max mesh, size: Large, was  introduced into the preperitoneal space.  The mesh was brought over to cover the direct and indirect hernia spaces.  This was anchored into place and secured to Cooper's ligament with 4.20mm staples from a Coviden hernia stapler. It was anchored to the anterior abdominal wall with 4.8 mm staples. The hernia sac was seen lying posterior to the mesh. There was no staples placed laterally. The insufflation was evacuated and the peritoneum was seen posterior to the mesh. The trochars were removed. The anterior fascia was reapproximated using #1 Vicryl on a UR- 6.  Intra-abdominal air was evacuated and the Veress needle removed. The skin was reapproximated using 4-0 Monocryl subcuticular fashion and Dermabond. The patient was awakened from general anesthesia and taken to recovery in stable condition.   PLAN OF CARE: Admit for overnight observation  PATIENT DISPOSITION:  PACU - hemodynamically stable.   Delay start of Pharmacological VTE agent (>24hrs) due to surgical blood loss or risk of bleeding: not applicable

## 2021-04-17 NOTE — Anesthesia Postprocedure Evaluation (Signed)
Anesthesia Post Note  Patient: [name]  Procedure(s) Performed: LAPAROSCOPIC LEFT INGUINAL HERNIA REPAIR WITH MESH (Left: Abdomen)     Patient location during evaluation: PACU Anesthesia Type: General Level of consciousness: awake and alert Pain management: pain level controlled Vital Signs Assessment: post-procedure vital signs reviewed and stable Respiratory status: spontaneous breathing, nonlabored ventilation and respiratory function stable Cardiovascular status: blood pressure returned to baseline and stable Postop Assessment: no apparent nausea or vomiting Anesthetic complications: no   No notable events documented.  Last Vitals:  Vitals:   04/17/21 1215 04/17/21 1255  BP: (!) 151/55 (!) 145/55  Pulse: 71 73  Resp: 13 18  Temp: (!) 36.3 C 36.5 C  SpO2: 98% 96%    Last Pain:  Vitals:   04/17/21 1255  TempSrc: Oral  PainSc:                  Lidia Collum

## 2021-04-17 NOTE — Progress Notes (Signed)
Subjective/Chief Complaint: Pt doing well  NGT inplace   Objective: Vital signs in last 24 hours: Temp:  [98.1 F (36.7 C)-99 F (37.2 C)] 98.5 F (36.9 C) (10/10 0450) Pulse Rate:  [61-66] 62 (10/10 0450) Resp:  [12-16] 16 (10/10 0450) BP: (140-148)/(54-59) 148/59 (10/10 0450) SpO2:  [94 %-96 %] 96 % (10/10 0450) Last BM Date: 04/13/21  Intake/Output from previous day: 10/09 0701 - 10/10 0700 In: 848.7 [P.O.:240; I.V.:608.7] Out: 500 [Urine:300; Emesis/NG output:200] Intake/Output this shift: Total I/O In: -  Out: 500 [Urine:500]  PE:  Constitutional: No acute distress, conversant, appears states age. Eyes: Anicteric sclerae, moist conjunctiva, no lid lag Lungs: Clear to auscultation bilaterally, normal respiratory effort CV: regular rate and rhythm, no murmurs, no peripheral edema, pedal pulses 2+ GI: Soft, no masses or hepatosplenomegaly, non-tender to palpation, reduced LIH Skin: No rashes, palpation reveals normal turgor Psychiatric: appropriate judgment and insight, oriented to person, place, and time   Lab Results:  Recent Labs    04/15/21 1317 04/16/21 0500  WBC 7.6 7.4  HGB 14.9 13.0  HCT 43.3 40.7  PLT 192 152   BMET Recent Labs    04/15/21 1317 04/16/21 0500  NA 137 141  K 4.2 5.0  CL 100 104  CO2 28 25  GLUCOSE 147* 103*  BUN 13 15  CREATININE 0.97 0.73  CALCIUM 9.8 9.0   PT/INR No results for input(s): LABPROT, INR in the last 72 hours. ABG No results for input(s): PHART, HCO3 in the last 72 hours.  Invalid input(s): PCO2, PO2  Studies/Results: CT Abdomen Pelvis W Contrast  Result Date: 04/15/2021 CLINICAL DATA:  Bowel obstructed cecectomy. Upper abdominal pain today. EXAM: CT ABDOMEN AND PELVIS WITH CONTRAST TECHNIQUE: Multidetector CT imaging of the abdomen and pelvis was performed using the standard protocol following bolus administration of intravenous contrast. CONTRAST:  74mL OMNIPAQUE IOHEXOL 350 MG/ML SOLN COMPARISON:   04/14/2021 FINDINGS: Lower chest: Calcified granuloma in the right lung. Large esophageal hiatal hernia. Wall thickening in the visualized distal esophagus likely representing reflux disease. Coronary artery calcifications. Hepatobiliary: No focal liver abnormality is seen. Status post cholecystectomy. No biliary dilatation. Pancreas: Unremarkable. No pancreatic ductal dilatation or surrounding inflammatory changes. Spleen: Normal in size without focal abnormality. Adrenals/Urinary Tract: No adrenal gland nodules. Renal nephrograms are symmetrical and homogeneous. Bilateral renal parenchymal lobulation or scarring. Cyst on the right kidney. No hydronephrosis or hydroureter. Bladder is filled with contrast material. No bladder wall thickening. Gas in the bladder may arise from instrumentation or cystitis. Stomach/Bowel: Stomach is fluid-filled with mild distention. There is a left inguinal hernia containing small bowel. Proximal to the hernia, there is dilatation of fluid-filled small bowel with decompression of the bowel distal to the hernia, consistent with small bowel obstruction. This is developing since the previous study. Colon is not abnormally distended. Scattered stool throughout the colon. Sigmoid colonic diverticulosis without evidence of acute diverticulitis. Appendix is normal. Vascular/Lymphatic: Diffuse calcification of the aorta. No aneurysm. No significant lymphadenopathy. Reproductive: Uterus is surgically absent. No abnormal pelvic masses. Other: No free air or free fluid in the abdomen. Multiple anterior abdominal wall hernias containing fat. These are likely postoperative. Musculoskeletal: Degenerative changes in the spine. No destructive bone lesions. IMPRESSION: 1. Left inguinal hernia containing small bowel. Since the previous study, there is interval development of small bowel obstruction at the level of the hernia. Distal small bowel are decompressed. 2. Large esophageal hiatal hernia. Wall  thickening of the distal esophagus suggesting reflux disease. 3.  Diffuse aortic atherosclerosis. 4. Diverticulosis of the sigmoid colon without evidence of acute diverticulitis. 5. Gas in the bladder could indicate instrumentation or infection. 6. Anterior abdominal wall hernias containing fat, likely postsurgical. Electronically Signed   By: Lucienne Capers M.D.   On: 04/15/2021 23:18   DG Abd Acute W/Chest  Result Date: 04/15/2021 CLINICAL DATA:  Upper abdominal pain with nausea and vomiting. EXAM: DG ABDOMEN ACUTE WITH 1 VIEW CHEST COMPARISON:  None. FINDINGS: Small hiatal hernia with air-fluid level. Lungs are clear. No pleural effusion. No free intraperitoneal air. No dilated loops of bowel. IMPRESSION: 1. Small hiatal hernia. No acute cardiopulmonary disease. 2. Nonobstructive bowel gas pattern. Electronically Signed   By: Ulyses Jarred M.D.   On: 04/15/2021 22:36    Anti-infectives: Anti-infectives (From admission, onward)    Start     Dose/Rate Route Frequency Ordered Stop   04/16/21 1845  cephALEXin (KEFLEX) capsule 250 mg        250 mg Oral Daily 04/16/21 1751         Assessment/Plan: 62F with incarcerated LIH SBO  Will plan surgery today for lap LIHR with mesh. All risks and benefits were discussed with the patient, to generally include infection, bleeding, damage to surrounding structures, acute and chronic nerve pain, and recurrence. Alternatives were offered and described.  All questions were answered and the patient voiced understanding of the procedure and wishes to proceed at this point.    LOS: 1 day    Ralene Ok 04/17/2021

## 2021-04-18 ENCOUNTER — Ambulatory Visit: Payer: Medicare HMO | Admitting: Nurse Practitioner

## 2021-04-18 ENCOUNTER — Encounter (HOSPITAL_COMMUNITY): Payer: Self-pay | Admitting: General Surgery

## 2021-04-18 DIAGNOSIS — K56609 Unspecified intestinal obstruction, unspecified as to partial versus complete obstruction: Secondary | ICD-10-CM | POA: Diagnosis not present

## 2021-04-18 LAB — CBC
HCT: 40.9 % (ref 36.0–46.0)
Hemoglobin: 13.4 g/dL (ref 12.0–15.0)
MCH: 29.8 pg (ref 26.0–34.0)
MCHC: 32.8 g/dL (ref 30.0–36.0)
MCV: 90.9 fL (ref 80.0–100.0)
Platelets: 146 10*3/uL — ABNORMAL LOW (ref 150–400)
RBC: 4.5 MIL/uL (ref 3.87–5.11)
RDW: 12.6 % (ref 11.5–15.5)
WBC: 6.6 10*3/uL (ref 4.0–10.5)
nRBC: 0 % (ref 0.0–0.2)

## 2021-04-18 LAB — GLUCOSE, CAPILLARY
Glucose-Capillary: 88 mg/dL (ref 70–99)
Glucose-Capillary: 182 mg/dL — ABNORMAL HIGH (ref 70–99)

## 2021-04-18 MED ORDER — POLYETHYLENE GLYCOL 3350 17 G PO PACK: 17.0000 g | PACK | Freq: Every day | ORAL | Status: DC | PRN

## 2021-04-18 MED ORDER — POLYETHYLENE GLYCOL 3350 17 G PO PACK: 17.0000 g | PACK | Freq: Every day | ORAL | 0 refills | Status: DC | PRN

## 2021-04-18 MED ORDER — TRAMADOL HCL 50 MG PO TABS: 50.0000 mg | ORAL_TABLET | Freq: Four times a day (QID) | ORAL | 0 refills | Status: DC | PRN

## 2021-04-18 MED ORDER — DOCUSATE SODIUM 100 MG PO CAPS
100.0000 mg | ORAL_CAPSULE | Freq: Two times a day (BID) | ORAL | Status: DC
  Administered 2021-04-18: 100 mg via ORAL
  Filled 2021-04-18: qty 1

## 2021-04-18 MED ORDER — TRAMADOL HCL 50 MG PO TABS: 50.0000 mg | ORAL_TABLET | Freq: Four times a day (QID) | ORAL | Status: DC | PRN

## 2021-04-18 NOTE — Plan of Care (Signed)
  Problem: Education: Goal: Knowledge of General Education information will improve Description: Including pain rating scale, medication(s)/side effects and non-pharmacologic comfort measures 04/18/2021 1047 by Barrington Ellison, RN Outcome: Adequate for Discharge 04/18/2021 1046 by Barrington Ellison, RN Outcome: Progressing   Problem: Health Behavior/Discharge Planning: Goal: Ability to manage health-related needs will improve 04/18/2021 1047 by Barrington Ellison, RN Outcome: Adequate for Discharge 04/18/2021 1046 by Barrington Ellison, RN Outcome: Progressing   Problem: Clinical Measurements: Goal: Ability to maintain clinical measurements within normal limits will improve 04/18/2021 1047 by Barrington Ellison, RN Outcome: Adequate for Discharge 04/18/2021 1046 by Barrington Ellison, RN Outcome: Progressing Goal: Will remain free from infection 04/18/2021 1047 by Barrington Ellison, RN Outcome: Adequate for Discharge 04/18/2021 1046 by Barrington Ellison, RN Outcome: Progressing Goal: Diagnostic test results will improve 04/18/2021 1047 by Barrington Ellison, RN Outcome: Adequate for Discharge 04/18/2021 1046 by Barrington Ellison, RN Outcome: Progressing Goal: Respiratory complications will improve 04/18/2021 1047 by Barrington Ellison, RN Outcome: Adequate for Discharge 04/18/2021 1046 by Barrington Ellison, RN Outcome: Progressing Goal: Cardiovascular complication will be avoided 04/18/2021 1047 by Barrington Ellison, RN Outcome: Adequate for Discharge 04/18/2021 1046 by Barrington Ellison, RN Outcome: Progressing   Problem: Activity: Goal: Risk for activity intolerance will decrease 04/18/2021 1047 by Barrington Ellison, RN Outcome: Adequate for Discharge 04/18/2021 1046 by Barrington Ellison, RN Outcome: Progressing   Problem: Nutrition: Goal: Adequate nutrition will be maintained 04/18/2021 1047 by Barrington Ellison, RN Outcome: Adequate for Discharge 04/18/2021 1046 by Barrington Ellison, RN Outcome: Progressing   Problem: Coping: Goal: Level of anxiety will decrease 04/18/2021 1047 by Barrington Ellison, RN Outcome: Adequate for Discharge 04/18/2021 1046 by Barrington Ellison, RN Outcome: Progressing   Problem: Elimination: Goal: Will not experience complications related to bowel motility 04/18/2021 1047 by Barrington Ellison, RN Outcome: Adequate for Discharge 04/18/2021 1046 by Barrington Ellison, RN Outcome: Progressing Goal: Will not experience complications related to urinary retention 04/18/2021 1047 by Barrington Ellison, RN Outcome: Adequate for Discharge 04/18/2021 1046 by Barrington Ellison, RN Outcome: Progressing   Problem: Pain Managment: Goal: General experience of comfort will improve 04/18/2021 1047 by Barrington Ellison, RN Outcome: Adequate for Discharge 04/18/2021 1046 by Barrington Ellison, RN Outcome: Progressing   Problem: Safety: Goal: Ability to remain free from injury will improve 04/18/2021 1047 by Barrington Ellison, RN Outcome: Adequate for Discharge 04/18/2021 1046 by Barrington Ellison, RN Outcome: Progressing   Problem: Skin Integrity: Goal: Risk for impaired skin integrity will decrease 04/18/2021 1047 by Barrington Ellison, RN Outcome: Adequate for Discharge 04/18/2021 1046 by Barrington Ellison, RN Outcome: Progressing

## 2021-04-18 NOTE — Consult Note (Signed)
[facility] CM Inpatient Consult   04/18/2021  ADIBA FARGNOLI 1935-04-16 [id number]  [place] [facility] [[facility]] Patient: [facility] Medicare   Primary Care Provider:  Arnold Long at [facility]   Patient chart reviewed for [place] Management services due to high unplanned readmission risk score.   Patient has been active with chronic care management team at primary provider office. Primary office does transition of care calls.   Of note, [facility] Care Management services does not replace or interfere with any services that are arranged by inpatient case management or social work.    Netta Cedars, MSN, [facility] Liaison Nurse Mobile Phone [phone]  Toll free office [phone]

## 2021-04-18 NOTE — Plan of Care (Signed)
  Problem: Clinical Measurements: Goal: Will remain free from infection Outcome: Progressing   Problem: Nutrition: Goal: Adequate nutrition will be maintained Outcome: Progressing   Problem: Coping: Goal: Level of anxiety will decrease Outcome: Progressing   Problem: Pain Managment: Goal: General experience of comfort will improve Outcome: Progressing   Problem: Safety: Goal: Ability to remain free from injury will improve Outcome: Progressing   

## 2021-04-18 NOTE — TOC Transition Note (Signed)
Transition of Care [facility]) - CM/SW Discharge Note   Patient Details  Name: Heather [name] MRN: [id number] Date of Birth: [birth year]  [facility]) CM/SW Contact:  Dessa Phi, RN Phone Number: 04/18/2021, 10:17 AM   Clinical Narrative:  d/c home no needs or orders.     Final next level of care: Home/Self Care Barriers to Discharge: No Barriers Identified   Patient Goals and CMS Choice   CMS Medicare.gov Compare Post Acute Care list provided to:: Patient Choice offered to / list presented to : Patient  Discharge Placement                       Discharge Plan and Services                                     Social Determinants of Health (SDOH) Interventions     Readmission Risk Interventions Readmission Risk Prevention Plan 04/18/2021 02/08/2021  Transportation Screening Complete Complete  PCP or Specialist Appt within 3-5 Days Complete -  HRI or Home Care Consult Complete -  Social Work Consult for [place] Planning/Counseling Complete -  Palliative Care Screening Complete -  Medication Review Press photographer) Complete Complete  HRI or [place] - Complete  SW Recovery Care/Counseling Consult - Complete  Palliative Care Screening - Not Ten Sleep - Not Applicable  Some recent data might be hidden

## 2021-04-18 NOTE — Discharge Summary (Signed)
Physician Discharge Summary  Heather [name] WUJ:[id number] DOB: October 16, 1934 DOA: 04/15/2021  PCP: Mosie Lukes, MD  Admit date: 04/15/2021 Discharge date: 04/18/2021  Admitted From: Home Disposition: Home  Recommendations for Outpatient Follow-up:  Follow up with PCP in 1-2 weeks Please obtain BMP/CBC in one week Please follow with general surgery as scheduled  Home Health: None Equipment/Devices: None  Discharge Condition: Stable CODE STATUS: Full Diet recommendation: Regular diet  Brief/Interim Summary: Heather [name] is a 86 y.o. female with medical history significant of patient with history of coronary artery disease status post cath multiple times and PCI, diabetes, dyspnea on exertion, GERD, history of cervical cancer with vaginal hysterectomy done, osteoarthritis, essential hypertension status postcholecystectomy who presented to the ER yesterday with abdominal pain.  She has had overactive bladder following up with OB/GYN.  She was seen last night again discharge early in the morning per her request after symptoms of abdominal pain and nausea resolved.  Presented back to the ED within 24 hours for worsening symptoms and appears to have a left inguinal hernia containing small bowel.  This has been manually reduced now by surgery.    After lengthy discussion patient and family agreed to inguinal hernia repair on 04/17/2021, tolerated procedure quite well, tolerating p.o. at this point pain well controlled otherwise stable and agreeable for discharge home.  Ongoing recommendations per surgery in regards to bandage management, activity and carrying/weight limits.   Assessment & Plan:   Small bowel obstruction due to inguinal hernia, resolved:  -Status post inguinal hernia repair 04/17/2021 -Postoperatively NG tube removed, advance diet as tolerated -Tolerated procedure quite well, tolerating p.o. abdomen pain well controlled   Hypertension, essential -Currently well controlled,  continue to hold home medications given normotensive at this time, daughter interested in weaning off medications as possible prior to discharge which is certainly reasonable   Non insulin-dependent diabetes, borderline uncontrolled with hyperglycemia -Continue home medications, discussed need for dietary improvement -A1c 6.4   Hyperlipidemia: Resume statin at discharge.   Chronic diastolic heart failure:  -Continue to follow clinically, avoid aggressive IV fluids and volume overload    Coronary artery disease: Chronic, no further work-up indicated at this time   Urinary retention/chronic recurrent UTI, POA Continue home keflex and antispasmodics  Discharge Instructions  Discharge Instructions     Call MD for:  extreme fatigue   Complete by: As directed    Call MD for:  persistant nausea and vomiting   Complete by: As directed    Call MD for:  redness, tenderness, or signs of infection (pain, swelling, redness, odor or green/yellow discharge around incision site)   Complete by: As directed    Call MD for:  severe uncontrolled pain   Complete by: As directed    Call MD for:  temperature >100.4   Complete by: As directed    Diet Carb Modified   Complete by: As directed    Discharge patient   Complete by: As directed    Discharge disposition: 01-Home or Self Care   Discharge patient date: 04/18/2021   Discharge wound care:   Complete by: As directed    Per surgery   Increase activity slowly   Complete by: As directed       Allergies as of 04/18/2021       Reactions   Baclofen Other (See Comments)   Hyperactivity   Nitrofurantoin Rash        Medication List     TAKE these medications  acetaminophen 500 MG tablet Commonly known as: TYLENOL Take 1,000 mg by mouth every 6 (six) hours as needed for moderate pain.   amLODipine 10 MG tablet Commonly known as: NORVASC TAKE 1 TABLET EVERY DAY   aspirin EC 81 MG tablet Take 1 tablet (81 mg total) by mouth  daily.   cephALEXin 250 MG capsule Commonly known as: Keflex Take 1 capsule (250 mg total) by mouth daily.   diclofenac Sodium 1 % Gel Commonly known as: VOLTAREN Apply 1 application topically 4 (four) times daily as needed (hip pain).   dicyclomine 20 MG tablet Commonly known as: BENTYL Take 1 tablet (20 mg total) by mouth 2 (two) times daily as needed for spasms (abdominal cramping).   famotidine 20 MG tablet Commonly known as: PEPCID Take 20 mg by mouth at bedtime as needed for heartburn or indigestion.   furosemide 20 MG tablet Commonly known as: Lasix Take 1 tablet (20 mg total) by mouth daily. What changed:  when to take this reasons to take this   isosorbide mononitrate 60 MG 24 hr tablet Commonly known as: IMDUR Take 1.5 tablets (90 mg total) by mouth daily.   losartan 100 MG tablet Commonly known as: COZAAR Take 0.5 tablets (50 mg total) by mouth 2 (two) times daily.   magnesium oxide 400 MG tablet Commonly known as: MAG-OX Take 400 mg by mouth at bedtime.   metoprolol succinate 25 MG 24 hr tablet Commonly known as: TOPROL-XL Take 1 tablet (25 mg total) by mouth daily.   nitroGLYCERIN 0.4 MG SL tablet Commonly known as: NITROSTAT Place 1 tablet (0.4 mg total) under the tongue every 5 (five) minutes as needed for chest pain.   omeprazole 20 MG capsule Commonly known as: PRILOSEC Take 1 capsule (20 mg total) by mouth in the morning.   ondansetron 4 MG disintegrating tablet Commonly known as: ZOFRAN-ODT Take 4 mg by mouth every 8 (eight) hours as needed for nausea or vomiting.   polyethylene glycol 17 g packet Commonly known as: MIRALAX / GLYCOLAX Take 17 g by mouth daily as needed for mild constipation.   potassium chloride SA 20 MEQ tablet Commonly known as: KLOR-CON Take 0.5 tablets (10 mEq total) by mouth daily.   pramipexole 0.25 MG tablet Commonly known as: MIRAPEX TAKE 1 TABLET TWICE DAILY   simvastatin 20 MG tablet Commonly known as:  ZOCOR TAKE 1 TABLET AT BEDTIME What changed: when to take this   traMADol 50 MG tablet Commonly known as: ULTRAM Take 1 tablet (50 mg total) by mouth every 6 (six) hours as needed for severe pain.   True Metrix Air Glucose Meter w/Device Kit USE TO CHECK BLOOD SUGAR ONCE DAILY AND AS NEEDED.  DX CODE E11.9   True Metrix Blood Glucose Test test strip Generic drug: glucose blood USE TO CHECK BLOOD SUGAR ONCE A DAY OR AS NEEDED.  DX CODE: E11.9   TRUEplus Lancets 33G Misc USE TO CHECK BLOOD SUGAR ONCE DAILY AND AS NEEDED.  DX CODE: E11.9   Vitamin C 500 MG Chew Chew 500 mg by mouth daily.   Vitamin D 50 MCG (2000 UT) Caps Take 2,000 Units by mouth daily.   Zinc 50 MG Tabs Take 50 mg by mouth daily.               Discharge Care Instructions  (From admission, onward)           Start     Ordered   04/18/21 0000  Discharge  wound care:       Comments: Per surgery   04/18/21 0731            Follow-up [facility]. Go on 05/02/2021.   Specialty: General Surgery Why: Your appointment is 05/02/21 at 4pm Please arrive 30 minutes prior to your appointment to check in and fill out paperowork. Bring photo ID and insurance information. Contact information: [place] [phone]               Allergies  Allergen Reactions   Baclofen Other (See Comments)    Hyperactivity    Nitrofurantoin Rash    Consultations: General surgery   Procedures/Studies: CT Abdomen Pelvis W Contrast  Result Date: 04/15/2021 CLINICAL DATA:  Bowel obstructed cecectomy. Upper abdominal pain today. EXAM: CT ABDOMEN AND PELVIS WITH CONTRAST TECHNIQUE: Multidetector CT imaging of the abdomen and pelvis was performed using the standard protocol following bolus administration of intravenous contrast. CONTRAST:  15m OMNIPAQUE IOHEXOL 350 MG/ML SOLN COMPARISON:  04/14/2021 FINDINGS: Lower  chest: Calcified granuloma in the right lung. Large esophageal hiatal hernia. Wall thickening in the visualized distal esophagus likely representing reflux disease. Coronary artery calcifications. Hepatobiliary: No focal liver abnormality is seen. Status post cholecystectomy. No biliary dilatation. Pancreas: Unremarkable. No pancreatic ductal dilatation or surrounding inflammatory changes. Spleen: Normal in size without focal abnormality. Adrenals/Urinary Tract: No adrenal gland nodules. Renal nephrograms are symmetrical and homogeneous. Bilateral renal parenchymal lobulation or scarring. Cyst on the right kidney. No hydronephrosis or hydroureter. Bladder is filled with contrast material. No bladder wall thickening. Gas in the bladder may arise from instrumentation or cystitis. Stomach/Bowel: Stomach is fluid-filled with mild distention. There is a left inguinal hernia containing small bowel. Proximal to the hernia, there is dilatation of fluid-filled small bowel with decompression of the bowel distal to the hernia, consistent with small bowel obstruction. This is developing since the previous study. Colon is not abnormally distended. Scattered stool throughout the colon. Sigmoid colonic diverticulosis without evidence of acute diverticulitis. Appendix is normal. Vascular/Lymphatic: Diffuse calcification of the aorta. No aneurysm. No significant lymphadenopathy. Reproductive: Uterus is surgically absent. No abnormal pelvic masses. Other: No free air or free fluid in the abdomen. Multiple anterior abdominal wall hernias containing fat. These are likely postoperative. Musculoskeletal: Degenerative changes in the spine. No destructive bone lesions. IMPRESSION: 1. Left inguinal hernia containing small bowel. Since the previous study, there is interval development of small bowel obstruction at the level of the hernia. Distal small bowel are decompressed. 2. Large esophageal hiatal hernia. Wall thickening of the distal  esophagus suggesting reflux disease. 3. Diffuse aortic atherosclerosis. 4. Diverticulosis of the sigmoid colon without evidence of acute diverticulitis. 5. Gas in the bladder could indicate instrumentation or infection. 6. Anterior abdominal wall hernias containing fat, likely postsurgical. Electronically Signed   By: WLucienne CapersM.D.   On: 04/15/2021 23:18   CT ABDOMEN PELVIS W CONTRAST  Result Date: 04/14/2021 CLINICAL DATA:  Abdominal distention, acute abdominal pain. Lower abdominal pain starting today. EXAM: CT ABDOMEN AND PELVIS WITH CONTRAST TECHNIQUE: Multidetector CT imaging of the abdomen and pelvis was performed using the standard protocol following bolus administration of intravenous contrast. CONTRAST:  1046mOMNIPAQUE IOHEXOL 300 MG/ML  SOLN COMPARISON:  02/04/2021 FINDINGS: Lower chest: Calcified granuloma in the right lower lung. Mild dependent changes in the lung bases. Moderate esophageal hiatal hernia. Hepatobiliary: Surgical absence of the gallbladder. No bile duct dilatation. Liver  is unremarkable. Pancreas: Unremarkable. No pancreatic ductal dilatation or surrounding inflammatory changes. Spleen: Normal in size without focal abnormality. Adrenals/Urinary Tract: No adrenal gland nodules. Bilateral renal scarring versus fetal lobulation. Bilateral renal cysts appearing benign. No hydronephrosis or hydroureter. Bladder wall is not thickened. There is gas in the bladder which could come from instrumentation or infection. Stomach/Bowel: Stomach, small bowel, and colon are not abnormally distended. Scattered diverticula mostly in the sigmoid colon. No inflammatory changes to suggest acute diverticulitis. Appendix is normal. Vascular/Lymphatic: Aortic atherosclerosis. No enlarged abdominal or pelvic lymph nodes. Reproductive: Uterus is surgically absent.  No abnormal pelvic mass. Other: There is a moderate left inguinal hernia containing a loop of small bowel. No proximal obstruction or wall  thickening. Minimal periumbilical hernia containing fat. Scarring along the anterior abdominal wall is likely postoperative. Musculoskeletal: Degenerative changes in the spine. No destructive bone lesions. IMPRESSION: 1. Left inguinal hernia containing small bowel. No proximal obstruction. 2. Diverticulosis of the sigmoid colon without evidence of diverticulitis. 3. Moderate esophageal hiatal hernia. 4. Gas within the bladder may result from instrumentation or infection. 5. Aortic atherosclerosis. Electronically Signed   By: Lucienne Capers M.D.   On: 04/14/2021 22:12   DG Abd Acute W/Chest  Result Date: 04/15/2021 CLINICAL DATA:  Upper abdominal pain with nausea and vomiting. EXAM: DG ABDOMEN ACUTE WITH 1 VIEW CHEST COMPARISON:  None. FINDINGS: Small hiatal hernia with air-fluid level. Lungs are clear. No pleural effusion. No free intraperitoneal air. No dilated loops of bowel. IMPRESSION: 1. Small hiatal hernia. No acute cardiopulmonary disease. 2. Nonobstructive bowel gas pattern. Electronically Signed   By: Ulyses Jarred M.D.   On: 04/15/2021 22:36     Subjective: No acute issues or events overnight denies nausea vomiting diarrhea constipation headache fevers chills chest pain or shortness of breath   Discharge Exam: Vitals:   04/18/21 0519 04/18/21 1003  BP: (!) 181/56 (!) 173/57  Pulse: 60 79  Resp: 18 18  Temp: 98.1 F (36.7 C) 98.1 F (36.7 C)  SpO2: 94% 97%   Vitals:   04/17/21 2048 04/18/21 0015 04/18/21 0519 04/18/21 1003  BP: (!) 148/54 (!) 156/60 (!) 181/56 (!) 173/57  Pulse: 60 (!) 58 60 79  Resp: _0 Temp: 98.6 F (37 C) 98.7 F (37.1 C) 98.1 F (36.7 C) 98.1 F (36.7 C)  TempSrc: Oral Oral Oral Oral  SpO2: 95% 96% 94% 97%  Weight:      Height:        General: Pt is alert, awake, not in acute distress Cardiovascular: RRR, S1/S2 +, no rubs, no gallops Respiratory: CTA bilaterally, no wheezing, no rhonchi Abdominal: Soft, NT, ND, bowel sounds  + Extremities: no edema, no cyanosis   The results of significant diagnostics from this hospitalization (including imaging, microbiology, ancillary and laboratory) are listed below for reference.     Microbiology: Recent Results (from the past 240 hour(s))  Resp Panel by RT-PCR (Flu A&B, Covid) Nasopharyngeal Swab     Status: None   Collection Time: 04/15/21 11:40 PM   Specimen: Nasopharyngeal Swab; Nasopharyngeal(NP) swabs in vial transport medium  Result Value Ref Range Status   SARS Coronavirus 2 by RT PCR NEGATIVE NEGATIVE Final    Comment: (NOTE) SARS-CoV-2 target nucleic acids are NOT DETECTED.  The SARS-CoV-2 RNA is generally detectable in upper respiratory specimens during the acute phase of infection. The lowest concentration of SARS-CoV-2 viral copies this assay can detect is 138 copies/mL. A negative result does not preclude  SARS-Cov-2 infection and should not be used as the sole basis for treatment or other patient management decisions. A negative result may occur with  improper specimen collection/handling, submission of specimen other than nasopharyngeal swab, presence of viral mutation(s) within the areas targeted by this assay, and inadequate number of viral copies(<138 copies/mL). A negative result must be combined with clinical observations, patient history, and epidemiological information. The expected result is Negative.  Fact Sheet for Patients:  EntrepreneurPulse.com.au  Fact Sheet for Healthcare Providers:  IncredibleEmployment.be  This test is no t yet approved or cleared by the [place] FDA and  has been authorized for detection and/or diagnosis of SARS-CoV-2 by FDA under an Emergency Use Authorization (EUA). This EUA will remain  in effect (meaning this test can be used) for the duration of the COVID-19 declaration under Section 564(b)(1) of the Act, 21 U.S.C.section 360bbb-3(b)(1), unless the authorization is  terminated  or revoked sooner.       Influenza A by PCR NEGATIVE NEGATIVE Final   Influenza B by PCR NEGATIVE NEGATIVE Final    Comment: (NOTE) The Xpert Xpress SARS-CoV-2/FLU/RSV plus assay is intended as an aid in the diagnosis of influenza from Nasopharyngeal swab specimens and should not be used as a sole basis for treatment. Nasal washings and aspirates are unacceptable for Xpert Xpress SARS-CoV-2/FLU/RSV testing.  Fact Sheet for Patients: EntrepreneurPulse.com.au  Fact Sheet for Healthcare Providers: IncredibleEmployment.be  This test is not yet approved or cleared by the [place] FDA and has been authorized for detection and/or diagnosis of SARS-CoV-2 by FDA under an Emergency Use Authorization (EUA). This EUA will remain in effect (meaning this test can be used) for the duration of the COVID-19 declaration under Section 564(b)(1) of the Act, 21 U.S.C. section 360bbb-3(b)(1), unless the authorization is terminated or revoked.  Performed at [facility], [place] [address], [place], [place] [address]      Labs: BNP (last 3 results) Recent Labs    01/29/21 0015 01/31/21 0501 02/08/21 0417  BNP 453.4* 118.6* 425.9*   Basic Metabolic Panel: Recent Labs  Lab 04/14/21 2100 04/15/21 1317 04/16/21 0500  NA 135 137 141  K 3.7 4.2 5.0  CL 99 100 104  CO2 _0 GLUCOSE 129* 147* 103*  BUN _1 CREATININE 0.76 0.97 0.73  CALCIUM 9.4 9.8 9.0   Liver Function Tests: Recent Labs  Lab 04/14/21 2100 04/15/21 1317 04/16/21 0500  AST _2 ALT _3 ALKPHOS 96 108 79  BILITOT 0.6 0.6 1.0  PROT 7.1 7.2 5.7*  ALBUMIN 4.6 4.5 3.4*   Recent Labs  Lab 04/15/21 1317  LIPASE 41   No results for input(s): AMMONIA in the last 168 hours. CBC: Recent Labs  Lab 04/14/21 2100 04/15/21 1317 04/16/21 0500 04/18/21 0424  WBC 6.7 7.6 7.4 6.6  NEUTROABS 4.2 5.5  --   --   HGB 13.5 14.9 13.0  13.4  HCT 41.4 43.3 40.7 40.9  MCV 89.8 90.6 94.7 90.9  PLT 164 192 152 146*   Cardiac Enzymes: No results for input(s): CKTOTAL, CKMB, CKMBINDEX, TROPONINI in the last 168 hours. BNP: Invalid input(s): POCBNP CBG: Recent Labs  Lab 04/17/21 0726 04/17/21 1140 04/17/21 1633 04/17/21 2049 04/18/21 0744  GLUCAP 99 114* 203* 116* 88   D-Dimer No results for input(s): DDIMER in the last 72 hours. Hgb A1c Recent Labs    04/16/21 0500  HGBA1C 6.4*   Lipid Profile No  results for input(s): CHOL, HDL, LDLCALC, TRIG, CHOLHDL, LDLDIRECT in the last 72 hours. Thyroid function studies No results for input(s): TSH, T4TOTAL, T3FREE, THYROIDAB in the last 72 hours.  Invalid input(s): FREET3 Anemia work up No results for input(s): VITAMINB12, FOLATE, FERRITIN, TIBC, IRON, RETICCTPCT in the last 72 hours. Urinalysis    Component Value Date/Time   COLORURINE YELLOW 04/16/2021 1202   APPEARANCEUR HAZY (A) 04/16/2021 1202   APPEARANCEUR Cloudy (A) 03/02/2021 1459   LABSPEC >1.046 (H) 04/16/2021 1202   PHURINE 5.0 04/16/2021 1202   GLUCOSEU NEGATIVE 04/16/2021 1202   GLUCOSEU NEGATIVE 03/06/2021 1105   HGBUR SMALL (A) 04/16/2021 1202   BILIRUBINUR NEGATIVE 04/16/2021 1202   BILIRUBINUR Negative 03/02/2021 1500   BILIRUBINUR Negative 03/02/2021 1459   KETONESUR 5 (A) 04/16/2021 1202   PROTEINUR 30 (A) 04/16/2021 1202   UROBILINOGEN 0.2 03/06/2021 1105   NITRITE NEGATIVE 04/16/2021 1202   LEUKOCYTESUR MODERATE (A) 04/16/2021 1202   Sepsis Labs Invalid input(s): PROCALCITONIN,  WBC,  LACTICIDVEN Microbiology Recent Results (from the past 240 hour(s))  Resp Panel by RT-PCR (Flu A&B, Covid) Nasopharyngeal Swab     Status: None   Collection Time: 04/15/21 11:40 PM   Specimen: Nasopharyngeal Swab; Nasopharyngeal(NP) swabs in vial transport medium  Result Value Ref Range Status   SARS Coronavirus 2 by RT PCR NEGATIVE NEGATIVE Final    Comment: (NOTE) SARS-CoV-2 target nucleic acids  are NOT DETECTED.  The SARS-CoV-2 RNA is generally detectable in upper respiratory specimens during the acute phase of infection. The lowest concentration of SARS-CoV-2 viral copies this assay can detect is 138 copies/mL. A negative result does not preclude SARS-Cov-2 infection and should not be used as the sole basis for treatment or other patient management decisions. A negative result may occur with  improper specimen collection/handling, submission of specimen other than nasopharyngeal swab, presence of viral mutation(s) within the areas targeted by this assay, and inadequate number of viral copies(<138 copies/mL). A negative result must be combined with clinical observations, patient history, and epidemiological information. The expected result is Negative.  Fact Sheet for Patients:  EntrepreneurPulse.com.au  Fact Sheet for Healthcare Providers:  IncredibleEmployment.be  This test is no t yet approved or cleared by the [place] FDA and  has been authorized for detection and/or diagnosis of SARS-CoV-2 by FDA under an Emergency Use Authorization (EUA). This EUA will remain  in effect (meaning this test can be used) for the duration of the COVID-19 declaration under Section 564(b)(1) of the Act, 21 U.S.C.section 360bbb-3(b)(1), unless the authorization is terminated  or revoked sooner.       Influenza A by PCR NEGATIVE NEGATIVE Final   Influenza B by PCR NEGATIVE NEGATIVE Final    Comment: (NOTE) The Xpert Xpress SARS-CoV-2/FLU/RSV plus assay is intended as an aid in the diagnosis of influenza from Nasopharyngeal swab specimens and should not be used as a sole basis for treatment. Nasal washings and aspirates are unacceptable for Xpert Xpress SARS-CoV-2/FLU/RSV testing.  Fact Sheet for Patients: EntrepreneurPulse.com.au  Fact Sheet for Healthcare Providers: IncredibleEmployment.be  This test is  not yet approved or cleared by the [place] FDA and has been authorized for detection and/or diagnosis of SARS-CoV-2 by FDA under an Emergency Use Authorization (EUA). This EUA will remain in effect (meaning this test can be used) for the duration of the COVID-19 declaration under Section 564(b)(1) of the Act, 21 U.S.C. section 360bbb-3(b)(1), unless the authorization is terminated or revoked.  Performed at [facility], [address] [place], [place] [address]      Time coordinating discharge: Over 30 minutes  SIGNED:   Little Ishikawa, DO [facility] 04/18/2021, 11:08 AM Pager   If 7PM-7AM, please contact night-coverage [email or web address]

## 2021-04-18 NOTE — Progress Notes (Signed)
Patient ID: Heather [name], female   DOB: 07/23/1934, 86 y.o.   MRN: [id number] [facility] Surgery Progress Note  1 Day Post-Op  Subjective: CC-  Comfortable this morning. Abdomen a little sore but tylenol helps. Tolerating full liquids. No n/v. Passing flatus, no BM. Ambulated in the halls yesterday.  Objective: Vital signs in last 24 hours: Temp:  [97.4 F (36.3 C)-99 F (37.2 C)] 98.1 F (36.7 C) (10/11 0519) Pulse Rate:  [58-82] 60 (10/11 0519) Resp:  [13-19] 18 (10/11 0519) BP: (113-181)/(54-78) 181/56 (10/11 0519) SpO2:  [94 %-100 %] 94 % (10/11 0519) Weight:  [66.7 kg] 66.7 kg (10/10 0945) Last BM Date: 04/13/21  Intake/Output from previous day: 10/10 0701 - 10/11 0700 In: 267.5 [P.O.:240; I.V.:27.5] Out: 1005 [Urine:1000; Blood:5] Intake/Output this shift: No intake/output data recorded.  PE: Gen:  Alert, NAD, pleasant Pulm: rate and effort normal Abd: Soft, NT/ND, +BS, incisions cdi Lab Results:  Recent Labs    04/16/21 0500 04/18/21 0424  WBC 7.4 6.6  HGB 13.0 13.4  HCT 40.7 40.9  PLT 152 146*   BMET Recent Labs    04/15/21 1317 04/16/21 0500  NA 137 141  K 4.2 5.0  CL 100 104  CO2 28 25  GLUCOSE 147* 103*  BUN 13 15  CREATININE 0.97 0.73  CALCIUM 9.8 9.0   PT/INR No results for input(s): LABPROT, INR in the last 72 hours. CMP     Component Value Date/Time   NA 141 04/16/2021 0500   NA 139 04/10/2021 1118   K 5.0 04/16/2021 0500   CL 104 04/16/2021 0500   CO2 25 04/16/2021 0500   GLUCOSE 103 (H) 04/16/2021 0500   BUN 15 04/16/2021 0500   BUN 22 04/10/2021 1118   CREATININE 0.73 04/16/2021 0500   CREATININE 0.95 11/10/2013 0328   CALCIUM 9.0 04/16/2021 0500   PROT 5.7 (L) 04/16/2021 0500   ALBUMIN 3.4 (L) 04/16/2021 0500   AST 26 04/16/2021 0500   ALT 13 04/16/2021 0500   ALKPHOS 79 04/16/2021 0500   BILITOT 1.0 04/16/2021 0500   GFRNONAA >60 04/16/2021 0500   GFRAA 57 (L) 08/19/2020 1351   Lipase     Component Value  Date/Time   LIPASE 41 04/15/2021 1317       Studies/Results: No results found.  Anti-infectives: Anti-infectives (From admission, onward)    Start     Dose/Rate Route Frequency Ordered Stop   04/17/21 1012  ceFAZolin (ANCEF) 2-4 GM/100ML-% IVPB       Note to Pharmacy: Enrigue Catena   : cabinet override      04/17/21 1012 04/17/21 2229   04/16/21 1845  cephALEXin (KEFLEX) capsule 250 mg        250 mg Oral Daily 04/16/21 1751          Assessment/Plan  Incarcerated left inguinal hernia  POD#1 s/p LAPAROSCOPIC LEFT INGUINAL HERNIA REPAIR WITH MESH (Left) 10/10 Dr. Rosendo Gros - Advance to carb mod diet. Add colace BID. [place] for discharge from surgical standpoint. Tramadol rx sent to pharmacy. Discharge instructions and follow up info on AVS.  ID - keflex 10/9>> FEN - CM diet VTE - lovenox Foley - none  HTN DM HLD Chronic diastolic CHF CAD with hx of stent   RLS Chronic recurrent UTI   LOS: 2 days    Wellington Hampshire, [facility] Surgery 04/18/2021, 8:57 AM Please see Amion for pager number during day hours 7:00am-4:30pm

## 2021-04-19 ENCOUNTER — Other Ambulatory Visit: Payer: Self-pay

## 2021-04-19 ENCOUNTER — Telehealth: Payer: Self-pay

## 2021-04-19 ENCOUNTER — Ambulatory Visit (INDEPENDENT_AMBULATORY_CARE_PROVIDER_SITE_OTHER): Payer: Medicare HMO | Admitting: Obstetrics and Gynecology

## 2021-04-19 VITALS — Wt 147.0 lb

## 2021-04-19 DIAGNOSIS — N3281 Overactive bladder: Secondary | ICD-10-CM | POA: Diagnosis not present

## 2021-04-19 NOTE — Progress Notes (Signed)
[facility]  PTNS VISIT  CC:  Overactive bladder  86 y.o. with refractory overactive bladder who presents for percutaneous tibial nerve stimulation. The patient presents for PTNS session # 8.  Procedure: The patient was placed in the sitting position and the left lower extremity was prepped in the usual fashion. The PTNS needle was then inserted at a 60 degree angle, 5 cm cephalad and 2 cm posterior to the medial malleolus. The PTNS unit was then programmed and an optimal response was noted at a setting of  5 milliamps. The PTNS stimulation was then performed at this setting for 30 minutes without incident and the patient tolerated the procedure well. The needle was removed and hemostasis was noted.   The pt will return in 1 week for PTNS session # 9. All questions were answered.  Jaquita Folds, MD

## 2021-04-19 NOTE — Telephone Encounter (Signed)
Transition Care Management Follow-up Telephone Call Date of discharge and from where: 04/18/2021-[place] How have you been since you were released from the hospital? Doing fine. Having a little pain with movement but not bad. Any questions or concerns? No  Items Reviewed: Did the pt receive and understand the discharge instructions provided? Yes  Medications obtained and verified? Yes  Other? Yes  Any new allergies since your discharge? No  Dietary orders reviewed? Yes Do you have support at home? No but patient states she has people she can call to help her if needed.  Home Care and Equipment/Supplies: Were home health services ordered? no If so, what is the name of the agency? N/a  Has the agency set up a time to come to the patient's home? not applicable Were any new equipment or medical supplies ordered?  No What is the name of the medical supply agency? N/a Were you able to get the supplies/equipment? not applicable Do you have any questions related to the use of the equipment or supplies? No  Functional Questionnaire: (I = Independent and D = Dependent) ADLs: I  Bathing/Dressing- I  Meal Prep- I  Eating- I  Maintaining continence- I  Transferring/Ambulation- I  Managing Meds- I  Follow up appointments reviewed:  PCP Hospital f/u appt confirmed? Yes  Scheduled to see Joylene Grapes on 04/26/2021 @ 10:0. [facility] f/u appt confirmed? Yes  Scheduled to see [facility] on 05/02/2021 @ 4:00. Are transportation arrangements needed? No  If their condition worsens, is the pt aware to call PCP or go to the Emergency Dept.? Yes Was the patient provided with contact information for the PCP's office or ED? Yes Was to pt encouraged to call back with questions or concerns? Yes

## 2021-04-19 NOTE — Telephone Encounter (Signed)
Transition Care Management Unsuccessful Follow-up Telephone Call  Date of discharge and from where:  04/18/2021-[facility]  Attempts:  1st Attempt  Reason for unsuccessful TCM follow-up call:  No answer/busy

## 2021-04-21 ENCOUNTER — Telehealth: Payer: Self-pay | Admitting: Pharmacist

## 2021-04-21 NOTE — Chronic Care Management (AMB) (Signed)
Chronic Care Management Pharmacy Assistant   Name: Heather [name]  MRN: [id number] DOB: [birth year]  Reason for Encounter: Disease State HTN   Recent office visits:  NONE NOTED   Recent consult visits:  04/17/21 Surgery-incarcerated left inguinal hernia  04/12/21 Urology Jaquita Folds MD- pt was seen for Overactive Bladder. The PTNS stimulation was then performed The needle was removed and hemostasis was noted The pt will return in 1 week for PTNS session # 8. And Pt was Advised to reduce intake of decaf coffee, tea or soda in the evening.  04/10/21 Cardiology Melissa D. Maccia RPH-CPP- pt was seen for Hypertension. Labs were ordered and pt was started on Metoprolol Succinate 25 mg daily and Losartan Potassium was changed to 50 mg BID. Pt advised to Call us with any questions [phone].  03/30/21 Sports Medicine Clearance Coots MD- pt was seen for Greater trochanteric pain syndrome of right lower extremity. Labs were ordered and pt received methylPREDNISolone Acetate 40 mg steroid injection pt advised to follow up in 4 weeks  03/22/21 OBGYN Sherlene Shams MD- pt was seen for overactive bladder. No labs or med changes. The pt will return in 1 week for PTNS session # 5.  03/21/21 Pharmacist Megan Supple RPH-CPP- pt was seen for Essential Hypertension. Pt increased Losartan Potassium to 100 mg and advised to Monitor your blood pressure and heart rate at home.  Follow up in 3 weeks for lab work and blood pressure check.  03/15/21 OB/GYN Sherlene Shams MD- pt seen for Recurrent UTI. Pt started Cephalexin 250 mg daily and discontinued Cefdinir and Mirabegron. Follow up in 1 week for PTNS session # 4  Hospital visits:  Medication Reconciliation was completed by comparing discharge summary, patient's EMR and Pharmacy list.   Admitted to the hospital on 04/15/21 due to Small Bowel Obstruction. Discharge date was 04/18/21. Discharged from [place]?Medications Started at [facility] Discharge:?? -started polyethylene glycol (MIRALAX / GLYCOLAX) traMADol Veatrice Bourbon)  Medication Changes at Hospital Discharge: -Changed none  Medications Discontinued at Hospital Discharge: -Stopped none  Medications that remain the same after Hospital Discharge:??  -All other medications will remain the same.    Admitted to the hospital on 04/14/21 due to Generalized abdominal pain. Discharge date was 04/15/21. Discharged from [facility]  New?Medications Started at [facility] Discharge:?? -started dicyclomine 20 MG tablet  Medication Changes at Hospital Discharge: -Changed ondansetron 4 MG disintegrating tablet  Medications Discontinued at Hospital Discharge: -Stopped none  Medications that remain the same after Hospital Discharge:??  -All other medications will remain the same.    Medications: Outpatient Encounter Medications as of 04/21/2021  Medication Sig Note   acetaminophen (TYLENOL) 500 MG tablet Take 1,000 mg by mouth every 6 (six) hours as needed for moderate pain.    amLODipine (NORVASC) 10 MG tablet TAKE 1 TABLET EVERY DAY (Patient taking differently: Take 10 mg by mouth daily.)    Ascorbic Acid (VITAMIN C) 500 MG CHEW Chew 500 mg by mouth daily. 04/17/2021: Not compliant   aspirin EC 81 MG tablet Take 1 tablet (81 mg total) by mouth daily.    Blood Glucose Monitoring Suppl (TRUE METRIX AIR GLUCOSE METER) w/Device KIT USE TO CHECK BLOOD SUGAR ONCE DAILY AND AS NEEDED.  DX CODE E11.9    cephALEXin (KEFLEX) 250 MG capsule Take 1 capsule (250 mg total) by mouth daily. 04/17/2021: Continuous for 6 months   Cholecalciferol (VITAMIN D) 50 MCG (2000 UT) CAPS  Take 2,000 Units by mouth daily.     diclofenac Sodium (VOLTAREN) 1 % GEL Apply 1 application topically 4 (four) times daily as needed (hip pain).    dicyclomine (BENTYL) 20 MG tablet Take 1 tablet (20 mg total) by mouth 2 (two) times daily as needed for  spasms (abdominal cramping).    famotidine (PEPCID) 20 MG tablet Take 20 mg by mouth at bedtime as needed for heartburn or indigestion.    furosemide (LASIX) 20 MG tablet Take 1 tablet (20 mg total) by mouth daily. (Patient taking differently: Take 20 mg by mouth daily as needed for fluid.)    glucose blood (TRUE METRIX BLOOD GLUCOSE TEST) test strip USE TO CHECK BLOOD SUGAR ONCE A DAY OR AS NEEDED.  DX CODE: E11.9    isosorbide mononitrate (IMDUR) 60 MG 24 hr tablet Take 1.5 tablets (90 mg total) by mouth daily.    losartan (COZAAR) 100 MG tablet Take 0.5 tablets (50 mg total) by mouth 2 (two) times daily.    magnesium oxide (MAG-OX) 400 MG tablet Take 400 mg by mouth at bedtime.    metoprolol succinate (TOPROL-XL) 25 MG 24 hr tablet Take 1 tablet (25 mg total) by mouth daily.    nitroGLYCERIN (NITROSTAT) 0.4 MG SL tablet Place 1 tablet (0.4 mg total) under the tongue every 5 (five) minutes as needed for chest pain.    omeprazole (PRILOSEC) 20 MG capsule Take 1 capsule (20 mg total) by mouth in the morning.    ondansetron (ZOFRAN-ODT) 4 MG disintegrating tablet Take 4 mg by mouth every 8 (eight) hours as needed for nausea or vomiting.    polyethylene glycol (MIRALAX / GLYCOLAX) 17 g packet Take 17 g by mouth daily as needed for mild constipation.    potassium chloride SA (KLOR-CON) 20 MEQ tablet Take 0.5 tablets (10 mEq total) by mouth daily.    pramipexole (MIRAPEX) 0.25 MG tablet TAKE 1 TABLET TWICE DAILY (Patient taking differently: Take 0.25 mg by mouth 2 (two) times daily.)    simvastatin (ZOCOR) 20 MG tablet TAKE 1 TABLET AT BEDTIME (Patient taking differently: Take 20 mg by mouth daily at 6 PM.)    traMADol (ULTRAM) 50 MG tablet Take 1 tablet (50 mg total) by mouth every 6 (six) hours as needed for severe pain.    TRUEplus Lancets 33G MISC USE TO CHECK BLOOD SUGAR ONCE DAILY AND AS NEEDED.  DX CODE: E11.9    Zinc 50 MG TABS Take 50 mg by mouth daily.    No facility-administered encounter  medications on file as of 04/21/2021.    Reviewed chart prior to disease state call. Spoke with patient regarding BP  Recent Office Vitals: BP Readings from Last 3 Encounters:  04/18/21 (!) 173/57  04/15/21 (!) 145/79  04/12/21 (!) 154/75   Pulse Readings from Last 3 Encounters:  04/18/21 79  04/15/21 (!) 58  04/12/21 65    Wt Readings from Last 3 Encounters:  04/19/21 147 lb (66.7 kg)  04/17/21 147 lb 0.8 oz (66.7 kg)  04/14/21 147 lb (66.7 kg)     Kidney Function Lab Results  Component Value Date/Time   CREATININE 0.73 04/16/2021 05:00 AM   CREATININE 0.97 04/15/2021 01:17 PM   CREATININE 0.95 11/10/2013 03:28 AM   CREATININE 1.03 01/17/2012 11:03 AM   GFR 57.22 (L) 03/06/2021 11:05 AM   GFRNONAA >60 04/16/2021 05:00 AM   GFRAA 57 (L) 08/19/2020 01:51 PM    BMP Latest Ref Rng & Units 04/16/2021 04/15/2021  04/14/2021  Glucose 70 - 99 mg/dL 103(H) 147(H) 129(H)  BUN 8 - 23 mg/dL '15 13 15  ' Creatinine 0.44 - 1.00 mg/dL 0.73 0.97 0.76  BUN/Creat Ratio 12 - 28 - - -  Sodium 135 - 145 mmol/L 141 137 135  Potassium 3.5 - 5.1 mmol/L 5.0 4.2 3.7  Chloride 98 - 111 mmol/L 104 100 99  CO2 22 - 32 mmol/L '25 28 26  ' Calcium 8.9 - 10.3 mg/dL 9.0 9.8 9.4    Current antihypertensive regimen:  Amlodipine  last fill 02/01/21 90 DS Losartan  last fill 04/10/21 90 DS Metoprolol  last fill 04/10/21 90 DS Imdur last fill 12/14/20 90 DS  How often are you checking your Blood Pressure?  3-4 times a day per Cardiology.   Current home BP readings:        04/24/21 136/56 (Glucose 121) 04/23/21 BP 146/66 BP  04/22/21 143/61, 136/62    What recent interventions/DTPs have been made by any provider to improve Blood Pressure control since last CPP Visit: No   Any recent hospitalizations or ED visits since last visit with CPP? Yes   What diet changes have been made to improve Blood Pressure Control?  Pt stated she's eating A lot Of salads and increased water intake.  What exercise is  being done to improve your Blood Pressure Control?  Pt states she does a lot walking.  Adherence Review: Is the patient currently on ACE/ARB medication? Yes Does the patient have >5 day gap between last estimated fill dates? No    Star Rating Drugs:  simvastatin (ZOCOR) 20 MG tablet last fill 04/10/21 90 DS losartan (COZAAR) 50 MG tablet last fill 04/10/21 90 DS   [place] Clinical Pharmacist Assistant [phone]

## 2021-04-26 ENCOUNTER — Ambulatory Visit (INDEPENDENT_AMBULATORY_CARE_PROVIDER_SITE_OTHER): Payer: Medicare HMO | Admitting: Medical

## 2021-04-26 ENCOUNTER — Encounter: Payer: Self-pay | Admitting: Medical

## 2021-04-26 ENCOUNTER — Other Ambulatory Visit: Payer: Self-pay

## 2021-04-26 VITALS — BP 144/68 | HR 68 | Temp 98.2°F | Resp 18 | Ht 61.0 in | Wt 147.6 lb

## 2021-04-26 DIAGNOSIS — I5032 Chronic diastolic (congestive) heart failure: Secondary | ICD-10-CM

## 2021-04-26 DIAGNOSIS — E1121 Type 2 diabetes mellitus with diabetic nephropathy: Secondary | ICD-10-CM | POA: Diagnosis not present

## 2021-04-26 DIAGNOSIS — R1110 Vomiting, unspecified: Secondary | ICD-10-CM

## 2021-04-26 DIAGNOSIS — K219 Gastro-esophageal reflux disease without esophagitis: Secondary | ICD-10-CM | POA: Diagnosis not present

## 2021-04-26 DIAGNOSIS — I10 Essential (primary) hypertension: Secondary | ICD-10-CM

## 2021-04-26 DIAGNOSIS — E785 Hyperlipidemia, unspecified: Secondary | ICD-10-CM | POA: Diagnosis not present

## 2021-04-26 DIAGNOSIS — K56609 Unspecified intestinal obstruction, unspecified as to partial versus complete obstruction: Secondary | ICD-10-CM | POA: Diagnosis not present

## 2021-04-26 DIAGNOSIS — E1169 Type 2 diabetes mellitus with other specified complication: Secondary | ICD-10-CM

## 2021-04-26 LAB — COMPREHENSIVE METABOLIC PANEL
ALT: 16 U/L (ref 0–35)
AST: 18 U/L (ref 0–37)
Albumin: 4.1 g/dL (ref 3.5–5.2)
Alkaline Phosphatase: 95 U/L (ref 39–117)
BUN: 16 mg/dL (ref 6–23)
CO2: 28 mEq/L (ref 19–32)
Calcium: 9.3 mg/dL (ref 8.4–10.5)
Chloride: 103 mEq/L (ref 96–112)
Creatinine, Ser: 0.79 mg/dL (ref 0.40–1.20)
GFR: 68.63 mL/min (ref >60.00)
Glucose, Bld: 131 mg/dL — ABNORMAL HIGH (ref 70–99)
Potassium: 4.4 mEq/L (ref 3.5–5.1)
Sodium: 138 mEq/L (ref 135–145)
Total Bilirubin: 0.5 mg/dL (ref 0.2–1.2)
Total Protein: 6.1 g/dL (ref 6.0–8.3)

## 2021-04-26 LAB — CBC WITH DIFFERENTIAL/PLATELET
Basophils Absolute: 0 10*3/uL (ref 0.0–0.1)
Basophils Relative: 0.6 % (ref 0.0–3.0)
Eosinophils Absolute: 0.3 10*3/uL (ref 0.0–0.7)
Eosinophils Relative: 5.3 % — ABNORMAL HIGH (ref 0.0–5.0)
HCT: 36.5 % (ref 36.0–46.0)
Hemoglobin: 12.1 g/dL (ref 12.0–15.0)
Lymphocytes Relative: 21.4 % (ref 12.0–46.0)
Lymphs Abs: 1.4 10*3/uL (ref 0.7–4.0)
MCHC: 33.2 g/dL (ref 30.0–36.0)
MCV: 90.1 fl (ref 78.0–100.0)
Monocytes Absolute: 0.8 10*3/uL (ref 0.1–1.0)
Monocytes Relative: 13 % — ABNORMAL HIGH (ref 3.0–12.0)
Neutro Abs: 3.8 10*3/uL (ref 1.4–7.7)
Neutrophils Relative %: 59.7 % (ref 43.0–77.0)
Platelets: 206 10*3/uL (ref 150.0–400.0)
RBC: 4.05 Mil/uL (ref 3.87–5.11)
RDW: 13.3 % (ref 11.5–15.5)
WBC: 6.3 10*3/uL (ref 4.0–10.5)

## 2021-04-26 LAB — LIPASE: Lipase: 56 U/L (ref 11.0–59.0)

## 2021-04-26 NOTE — Progress Notes (Signed)
Subjective:    Patient ID: [name], female    DOB: 1934/12/19, 86 y.o.   MRN: [id number]  HPI  Pt in for follow up from hospital/admitted for SBO. Pt is by herself.  Below DC summary.  Admit date: 04/15/2021 Discharge date: 04/18/2021   Admitted From: Home Disposition: Home   Recommendations for Outpatient Follow-up:  Follow up with PCP in 1-2 weeks Please obtain BMP/CBC in one week Please follow with general surgery as scheduled   Home Health: None Equipment/Devices: None   Discharge Condition: Stable CODE STATUS: Full Diet recommendation: Regular diet   Brief/Interim Summary: JUNA CABAN is a 86 y.o. female with medical history significant of patient with history of coronary artery disease status post cath multiple times and PCI, diabetes, dyspnea on exertion, GERD, history of cervical cancer with vaginal hysterectomy done, osteoarthritis, essential hypertension status postcholecystectomy who presented to the ER yesterday with abdominal pain.  She has had overactive bladder following up with OB/GYN.  She was seen last night again discharge early in the morning per her request after symptoms of abdominal pain and nausea resolved.  Presented back to the ED within 24 hours for worsening symptoms and appears to have a left inguinal hernia containing small bowel.  This has been manually reduced now by surgery.     After lengthy discussion patient and family agreed to inguinal hernia repair on 04/17/2021, tolerated procedure quite well, tolerating p.o. at this point pain well controlled otherwise stable and agreeable for discharge home.  Ongoing recommendations per surgery in regards to bandage management, activity and carrying/weight limits.   Assessment & Plan:   Small bowel obstruction due to inguinal hernia, resolved:  -Status post inguinal hernia repair 04/17/2021 -Postoperatively NG tube removed, advance diet as tolerated -Tolerated procedure quite well, tolerating  p.o. abdomen pain well controlled   Hypertension, essential -Currently well controlled, continue to hold home medications given normotensive at this time, daughter interested in weaning off medications as possible prior to discharge which is certainly reasonable   Non insulin-dependent diabetes, borderline uncontrolled with hyperglycemia -Continue home medications, discussed need for dietary improvement -A1c 6.4   Hyperlipidemia: Resume statin at discharge.   Chronic diastolic heart failure:  -Continue to follow clinically, avoid aggressive IV fluids and volume overload    Coronary artery disease: Chronic, no further work-up indicated at this time   Urinary retention/chronic recurrent UTI, POA Continue home keflex and antispasmodics   Follow-up [facility], [place]. Go on 05/02/2021.   Specialty: General Surgery Why: Your appointment is 05/02/21 at 4pm Please arrive 30 minutes prior to your appointment to check in and fill out paperowork. Bring photo ID and insurance information. Contact information: [address] [place] [phone]     Pt states she has had no abdomen pain. States at best sore since DC from hospital. Pt states having normal bowel movements. Since discharge from hospital. One loose stool this morning but no constipation.  Pt does states some reflux last night. She states took some liquid medication for reflux. She vomiting one time after took med. No longer has any reflux. States when has will feel like acid all the way up to her throat. Pt is on omeprazole daily.  Pt is taking her amlodipine, metoprolol  and losartan.   CHF- takes lasix as needed. No recent pedal edema.  Review of Systems  Constitutional:  Negative for chills, fatigue and fever.  HENT:  Negative for congestion and drooling.   Respiratory:  Negative for cough, chest tightness, shortness of breath and wheezing.    Cardiovascular:  Negative for chest pain and palpitations.  Gastrointestinal:  Positive for vomiting. Negative for abdominal pain, blood in stool, constipation, diarrhea and nausea.       One time last night. None since.  Genitourinary:  Negative for difficulty urinating, dysuria, frequency and hematuria.  Musculoskeletal:  Positive for neck pain. Negative for back pain.  Skin:  Negative for pallor and rash.    Past Medical History:  Diagnosis Date   Chronic stable angina [facility])    Coronary artery disease cardiologist--- dr Burt Knack   hx NSTEMI  w/ cardiac cath 03-16-2005 PCI with DES to LCx;   cardiac cath 06-27-2005  PCI with DES to RCA with residual dx LAD manage medcially;  lexiscan 01-26-20211 normal no ischemia, ef 70%;  stress echo w/ dobutamine 10-24-2011 negative ishcmeia , normal ef // Myoview 2/22: EF 67, no ischemia or infarction, no TID, low risk    Diabetes mellitus type 2, diet-controlled ([place])    followed by pcp  (10-19-2020  pt stated checks daily in am,  fasting blood surgar--- 115--120s)   DOE (dyspnea on exertion)    per pt "when I over do",  ok with household chores   Echocardiogram 08/2020    Echocardiogram 2/22: EF 55-60, no RWMA, mild LVH, Gr 2 DD, GLS-21.7%, normal RVSF, trivial MR, RVSP 39.5   Edema of right lower extremity    GERD (gastroesophageal reflux disease)    Hiatal hernia    recurrence,  hx HH repair 1989   History of cervical cancer    s/p  vaginal hysterectomy   History of DVT of lower extremity 2016   11-29-2014 post op right TKA of right lower extremity and completed xarelto    History of esophageal stricture    hx s/p dilatation's   History of gastric ulcer 2005 approx.   History of palpitations 2010   event monitor 07-07-2009 showed NSR w/ freq. SVT ectopies with short runs, rare PVCs   History of TIA (transient ischemic attack) 06/1999   12-15-2019  per pt had several TIA between 12/ 2000 to 02/ 2001 , was sent to specialist '@[facility]' , had test  that was normal (10-19-2020 pt stated no TIAs since ) but has residual of essential tremors of right arm/ hand   Hypertension    Intermittent palpitations    IT band syndrome    Migraine    "ice pick headche lasts about 30 seconds"   Mixed hyperlipidemia    Mixed incontinence urge and stress    Multiple thyroid nodules    followed by pcp---   ultrasound 11-22-2014 no bx   (12-15-2019 per pt had a endocrinologist and was told did not need bx)   OA (osteoarthritis)    knees, elbow, hip, ankles   Occasional tremors    right arm/ hand  s/p TIA residual 2000   Osteoporosis    taking vitamin d   Peroneal DVT (deep venous thrombosis) ([place]) 12/22/2014   Right bundle branch block (RBBB) with left anterior fascicular block (LAFB)    RLS (restless legs syndrome)    S/P drug eluting coronary stent placement 2006   03-16-2005  PCI x1 DES to LCx;   06-27-2005  PCI x1 DES to RCA   Urinary retention    post op sling prodecure on 10-27-2020, has foley cathether     Social History  Socioeconomic History   Marital status: Widowed    Spouse name: Not on file   Number of children: 3   Years of education: 12   Highest education level: Not on file  Occupational History   Occupation: works partime in Office manager: RETIRED    Comment: retired  Tobacco Use   Smoking status: Never   Smokeless tobacco: Never  Vaping Use   Vaping Use: Never used  Substance and Sexual Activity   Alcohol use: No    Alcohol/week: 0.0 standard drinks   Drug use: Never   Sexual activity: Not Currently    Birth control/protection: Surgical    Comment: lives alone  Other Topics Concern   Not on file  Social History Narrative   Lives with husband, Caffeine use- Half Caffeine)- 2 cups daily.  3 children living, one passed away.   Education: HS. Business college.  Retired.    Social Determinants of Health   Financial Resource Strain: Low Risk    Difficulty of Paying Living Expenses: Not hard at all  Food  Insecurity: No Food Insecurity   Worried About Charity fundraiser in the Last Year: Never true   [place] in the Last Year: Never true  Transportation Needs: No Transportation Needs   Lack of Transportation (Medical): No   Lack of Transportation (Non-Medical): No  Physical Activity: Inactive   Days of Exercise per Week: 0 days   Minutes of Exercise per Session: 0 min  Stress: Not on file  Social Connections: Not on file  Intimate Partner Violence: Not on file    Past Surgical History:  Procedure Laterality Date   ANTERIOR AND POSTERIOR REPAIR N/A 12/22/2019   Procedure: ANTERIOR (CYSTOCELE)  REPAIR;  Surgeon: Janyth Contes, MD;  Location: [facility];  Service: Gynecology;  Laterality: N/A;   ANTERIOR AND POSTERIOR REPAIR WITH SACROSPINOUS FIXATION N/A 10/27/2020   Procedure: SACROSPINOUS LIGAMENT FIXATION;  Surgeon: Jaquita Folds, MD;  Location[facility];  Service: Gynecology;  Laterality: N/A;   BLADDER SUSPENSION N/A 10/27/2020   Procedure: TRANSVAGINAL TAPE (TVT) PROCEDURE;  Surgeon: Jaquita Folds, MD;  [facility];  Service: Gynecology;  Laterality: N/A;   CATARACT EXTRACTION W/ INTRAOCULAR LENS  IMPLANT, BILATERAL  2015   CHOLECYSTECTOMY N/A 01/25/2021   Procedure: LAPAROSCOPIC CHOLECYSTECTOMY WITH INTRAOPERATIVE CHOLANGIOGRAM AND LYSIS OF ADHESIONS;  Surgeon: Michael Boston, MD;  Location: [facility];  Service: General;  Laterality: N/A;   COLONOSCOPY  last one ?   CORONARY ANGIOPLASTY WITH STENT PLACEMENT  03-16-2005   dr Lia Foyer   PCI and DES x1 to LCx   CORONARY ANGIOPLASTY WITH STENT PLACEMENT  06-27-2005  dr Lia Foyer   PCI and DES x1 to RCA with residual disease LAD 70-80% to manage medically   CYSTOSCOPY N/A 10/27/2020   Procedure: CYSTOSCOPY;  Surgeon: Jaquita Folds, MD;  Location: [facility];  Service: Gynecology;  Laterality: N/A;   CYSTOSCOPY N/A 11/09/2020    Procedure: CYSTOSCOPY;  Surgeon: Jaquita Folds, MD;  Location: [facility];  Service: Gynecology;  Laterality: N/A;   FOOT SURGERY Left 1990s   left foot stress fracture repair, per pt no hardware   HIATAL HERNIA REPAIR  1989   INCISIONAL HERNIA REPAIR N/A 01/25/2021   Procedure: PRIMARY REPAIR OF INCISIONAL HERNIA;  Surgeon: Michael Boston, MD;  Location: [facility];  Service: General;  Laterality: N/A;   INGUINAL HERNIA REPAIR Left 04/17/2021  Procedure: LAPAROSCOPIC LEFT INGUINAL HERNIA REPAIR WITH MESH;  Surgeon: Ralene Ok, MD;  Location: [facility];  Service: General;  Laterality: Left;   KNEE ARTHROSCOPY Bilateral right ?/   left x2 , last one 09-12-2009 @ [facility]   PUBOVAGINAL SLING N/A 11/09/2020   Procedure: [place];  Surgeon: Jaquita Folds, MD;  Location: [facility];  Service: Gynecology;  Laterality: N/A;   RECTOCELE REPAIR N/A 04/20/2020   Procedure: POSTERIOR REPAIR (RECTOCELE);  Surgeon: Janyth Contes, MD;  Location: [facility];  Service: Gynecology;  Laterality: N/A;   RECTOCELE REPAIR N/A 10/27/2020   Procedure: POSTERIOR REPAIR (RECTOCELE);  Surgeon: Jaquita Folds, MD;  Location: [facility];  Service: Gynecology;  Laterality: N/A;  total time requested for all procedures is 2 hours   TOTAL KNEE ARTHROPLASTY  11/12/2011   Procedure: TOTAL KNEE ARTHROPLASTY;  Surgeon: Lorn Junes, MD;  Location: [facility];  Service: Orthopedics;  Laterality: Left;  Dr Noemi Chapel wants 90 minutes for this case   TOTAL KNEE ARTHROPLASTY Right 11/29/2014   Procedure: RIGHT TOTAL KNEE ARTHROPLASTY;  Surgeon: Vickey Huger, MD;  Location: [facility];  Service: Orthopedics;  Laterality: Right;   UPPER GASTROINTESTINAL ENDOSCOPY  last one 04-25-2017   with dilatation esophageal stricture and savary dilatation   VAGINAL HYSTERECTOMY  1988    no ovaries removed for bleeeding    Family History  Problem  Relation Age of Onset   Stroke Father        family hx of M 1st degree relative <50   Coronary artery disease Mother    Heart disease Mother    Depression Brother    Stroke Brother    Diabetes Brother    Cancer Brother        bladder with mets   Diabetes Daughter        borderline   Hypertension Daughter    Arthritis Other        family hx of   Hypertension Other        family hx of   Other Other        family hx of cardiovascular disorder   Thyroid disease Daughter    Breast cancer Neg Hx    Colon cancer Neg Hx    Anesthesia problems Neg Hx    Hypotension Neg Hx    Malignant hyperthermia Neg Hx    Pseudochol deficiency Neg Hx    Colon polyps Neg Hx    Esophageal cancer Neg Hx    Rectal cancer Neg Hx    Stomach cancer Neg Hx     Allergies  Allergen Reactions   Baclofen Other (See Comments)    Hyperactivity    Nitrofurantoin Rash    Current Outpatient Medications on File Prior to Visit  Medication Sig Dispense Refill   acetaminophen (TYLENOL) 500 MG tablet Take 1,000 mg by mouth every 6 (six) hours as needed for moderate pain.     amLODipine (NORVASC) 10 MG tablet TAKE 1 TABLET EVERY DAY (Patient taking differently: Take 10 mg by mouth daily.) 90 tablet 1   Ascorbic Acid (VITAMIN C) 500 MG CHEW Chew 500 mg by mouth daily.     aspirin EC 81 MG tablet Take 1 tablet (81 mg total) by mouth daily.     Blood Glucose Monitoring Suppl (TRUE METRIX AIR GLUCOSE METER) w/Device KIT USE TO CHECK BLOOD SUGAR ONCE DAILY AND AS NEEDED.  DX CODE E11.9 1 kit 0  cephALEXin (KEFLEX) 250 MG capsule Take 1 capsule (250 mg total) by mouth daily. 30 capsule 5   Cholecalciferol (VITAMIN D) 50 MCG (2000 UT) CAPS Take 2,000 Units by mouth daily.      diclofenac Sodium (VOLTAREN) 1 % GEL Apply 1 application topically 4 (four) times daily as needed (hip pain).     dicyclomine (BENTYL) 20 MG tablet Take 1 tablet (20 mg total) by mouth 2 (two) times daily as needed for spasms (abdominal  cramping). 20 tablet 0   famotidine (PEPCID) 20 MG tablet Take 20 mg by mouth at bedtime as needed for heartburn or indigestion.     furosemide (LASIX) 20 MG tablet Take 1 tablet (20 mg total) by mouth daily. (Patient taking differently: Take 20 mg by mouth daily as needed for fluid.) 30 tablet 11   glucose blood (TRUE METRIX BLOOD GLUCOSE TEST) test strip USE TO CHECK BLOOD SUGAR ONCE A DAY OR AS NEEDED.  DX CODE: E11.9 200 each 1   isosorbide mononitrate (IMDUR) 60 MG 24 hr tablet Take 1.5 tablets (90 mg total) by mouth daily. 135 tablet 3   losartan (COZAAR) 100 MG tablet Take 0.5 tablets (50 mg total) by mouth 2 (two) times daily. 90 tablet 3   magnesium oxide (MAG-OX) 400 MG tablet Take 400 mg by mouth at bedtime.     metoprolol succinate (TOPROL-XL) 25 MG 24 hr tablet Take 1 tablet (25 mg total) by mouth daily. 90 tablet 3   nitroGLYCERIN (NITROSTAT) 0.4 MG SL tablet Place 1 tablet (0.4 mg total) under the tongue every 5 (five) minutes as needed for chest pain. 25 tablet 11   omeprazole (PRILOSEC) 20 MG capsule Take 1 capsule (20 mg total) by mouth in the morning.     ondansetron (ZOFRAN-ODT) 4 MG disintegrating tablet Take 4 mg by mouth every 8 (eight) hours as needed for nausea or vomiting.     polyethylene glycol (MIRALAX / GLYCOLAX) 17 g packet Take 17 g by mouth daily as needed for mild constipation. 14 each 0   potassium chloride SA (KLOR-CON) 20 MEQ tablet Take 0.5 tablets (10 mEq total) by mouth daily. 45 tablet 2   pramipexole (MIRAPEX) 0.25 MG tablet TAKE 1 TABLET TWICE DAILY (Patient taking differently: Take 0.25 mg by mouth 2 (two) times daily.) 180 tablet 1   simvastatin (ZOCOR) 20 MG tablet TAKE 1 TABLET AT BEDTIME (Patient taking differently: Take 20 mg by mouth daily at 6 PM.) 90 tablet 3   traMADol (ULTRAM) 50 MG tablet Take 1 tablet (50 mg total) by mouth every 6 (six) hours as needed for severe pain. 15 tablet 0   TRUEplus Lancets 33G MISC USE TO CHECK BLOOD SUGAR ONCE DAILY  AND AS NEEDED.  DX CODE: E11.9 200 each 1   Zinc 50 MG TABS Take 50 mg by mouth daily.     No current facility-administered medications on file prior to visit.    BP (!) 160/60   Pulse 68   Temp 98.2 F (36.8 C)   Resp 18   Ht '5\' 1"'  (1.549 m)   Wt 147 lb 9.6 oz (67 kg)   LMP  (LMP Unknown)   SpO2 96%   BMI 27.89 kg/m       Objective:   Physical Exam  General Mental Status- Alert. General Appearance- Not in acute distress.   Skin General: Color- Normal Color. Moisture- Normal Moisture.  Neck Carotid Arteries- Normal color. Moisture- Normal Moisture. No carotid bruits. No JVD.  Chest and Lung Exam Auscultation: Breath Sounds:-Normal.  Cardiovascular Auscultation:Rythm- Regular. Murmurs & Other Heart Sounds:Auscultation of the heart reveals- No Murmurs.  Abdomen Inspection:-Inspeection Normal. Palpation/Percussion:Note:No mass. Palpation and Percussion of the abdomen reveal- Non Tender, Non Distended + BS, no rebound or guarding.   Neurologic Cranial Nerve exam:- CN III-XII intact(No nystagmus), symmetric smile. Strength:- 5/5 equal and symmetric strength both upper and lower extremities.       Assessment & Plan:   Patient Instructions  Follow-up posthospitalization for small bowel obstruction.  Clinically stable with good physical exam.  Having normal bowel movements.  No constipation reported.  We will get CBC and metabolic panel posthospitalization.  CHF.  Clinically stable presently.  No pedal edema.  Good O2 sat.  Can continue as needed Lasix as you have been doing.  Hypertension.  Progressive better blood pressures on recheck in office and you had very good blood pressure reading this morning.  Continue amlodipine, metoprolol and losartan.  Diabetes well controlled by recent A1c of 6.4.  GERD with symptom flare last night.  Continue omeprazole and add famotidine/Pepcid as needed.  Will include lipase with labs today.  If he has any recurrent vomiting  let us know.  Also getting posthospitalization CBC and metabolic panel.  Hyperlipidemia.  Continue statin.  Follow-up date to be determined after lab review.  Also try to schedule follow-up with PCP.   [name], PA-C

## 2021-04-26 NOTE — Patient Instructions (Addendum)
Follow-up posthospitalization for small bowel obstruction.  Clinically stable with good physical exam.  Having normal bowel movements.  No constipation reported.  We will get CBC and metabolic panel posthospitalization. Keep follow up  appt with general surgeon upcoming.  CHF.  Clinically stable presently.  No pedal edema.  Good O2 sat.  Can continue as needed Lasix as you have been doing.  Hypertension.  Progressive better blood pressures on recheck in office and you had very good blood pressure reading this morning.  Continue amlodipine, metoprolol and losartan.  Diabetes well controlled by recent A1c of 6.4.  GERD with symptom flare last night.  Continue omeprazole and add famotidine/Pepcid as needed.  Will include lipase with labs today.  If he has any recurrent vomiting let us know.  Also getting posthospitalization CBC and metabolic panel.  Hyperlipidemia.  Continue statin.  Follow-up date to be determined after lab review.  Also try to schedule follow-up with PCP.

## 2021-04-28 ENCOUNTER — Other Ambulatory Visit: Payer: Self-pay

## 2021-04-28 ENCOUNTER — Ambulatory Visit (INDEPENDENT_AMBULATORY_CARE_PROVIDER_SITE_OTHER): Payer: Medicare HMO | Admitting: Obstetrics and Gynecology

## 2021-04-28 VITALS — BP 152/87 | HR 82

## 2021-04-28 DIAGNOSIS — N3281 Overactive bladder: Secondary | ICD-10-CM

## 2021-04-28 NOTE — Progress Notes (Signed)
[facility]  PTNS VISIT  CC:  Overactive bladder  86 y.o. with refractory overactive bladder who presents for percutaneous tibial nerve stimulation. The patient presents for PTNS session # 9.  Procedure: The patient was placed in the sitting position and the left lower extremity was prepped in the usual fashion. The PTNS needle was then inserted at a 60 degree angle, 5 cm cephalad and 2 cm posterior to the medial malleolus. The PTNS unit was then programmed and an optimal response was noted at a setting of  6 milliamps. The PTNS stimulation was then performed at this setting for 30 minutes without incident and the patient tolerated the procedure well. The needle was removed and hemostasis was noted.   The pt will return in 1 week for PTNS session # 10. All questions were answered.  Jaquita Folds, MD

## 2021-05-01 ENCOUNTER — Other Ambulatory Visit: Payer: Self-pay

## 2021-05-01 ENCOUNTER — Ambulatory Visit: Payer: Medicare HMO

## 2021-05-01 VITALS — BP 158/72 | HR 75

## 2021-05-01 DIAGNOSIS — I10 Essential (primary) hypertension: Secondary | ICD-10-CM | POA: Diagnosis not present

## 2021-05-01 NOTE — Progress Notes (Signed)
Patient ID: Heather [name]                 DOB: 1935-07-08                      MRN: [id number]     HPI: Heather [name] is a 86 y.o. female referred by Dr. Burt Knack to HTN clinic. PMH is significant for CAD s/p DES to LCx and RCA in 2006, CVA, DM, HTN, HLD, GERD, post-op DVT after TKR in 2016, overactive bladder, and palpitations. Has been hospitalized multiple times recently with a variety of problems including cholecystitis, acute diastolic CHF, and sepsis. Several meds were adjusted - her beta blocker was stopped secondary to bradycardia, ARB and spironolactone were stopped due to AKI. Reported resting tremor, fatigue and SOB with exertion at her last visit with Dr Burt Knack on 8/18. BP was elevated at 162/60 and her losartan was resumed at 51m daily with stable f/u BMET.   Over last few visits with PharmD, losartan was increased to 1027mdaily. She was also restarted on metoprolol succinate at 252maily as she noticed increasing palpitations at home.  At last visit, patient reported her BP was 160-170s/70s in the AM and 120-130s/60s in the afternoon, but does not rest prior to checking. She has a wrist cuff that has not been verified. She also has a nurse who comes and checks her BP which is usually 120-130s/60-70s. HR in the office was 68 and has not complained of palpitations since resuming metoprolol. The provider split that losartan to 50 mg BID to see if this would help AM blood pressures and other HTN meds were continued as below. BMP was drawn showing K 4.4 and Scr stable at 0.79.   Patient presents to clinic today in good spirits. Reports tolerating meds well, but notes that when she was admitted to the hospital 10/11 for hernia repair her BP was well controlled, but told that her HR was very elevated. She has brought in her wrist BP cuff that I validated is within 5 points of my BP reading today. She also brought in her home BP log and states that she has mostly been sitting to rest before taking  her BP. BP readings as below. Of note, pt experienced 2 hypotensive episodes on 10/7 and 10/21, with readings mostly controlled in the 120-130s and 2 early AM readings with SBP > 150. Patient notes that she has not need to use her Lasix for swelling and her last use was 3-4 weeks ago. Her BP today in clinic was 160/75 on first check and 158/72 on repeat (wrist cuff measured 153/71).   When asked about timing of her medications patient states she takes her AM medications around 7:30am and the metoprolol around 8pm before bed. Of note, the patient has overactive bladder and wakes up 3-4 times a night to use the bathroom usually starting after midnight.   Current HTN meds: amlodipine 73m75mily (AM), losartan 50 mg BID (AM + PM), Imdur 90 mg daily (AM), furosemide 20mg7m, metoprolol succinate 25mg 78my (PM)  BP goal: <130/80  Family History: Arthritis in an other family member; Cancer in her brother; Coronary artery disease in her mother; Depression in her brother; Diabetes in her brother and daughter; Heart disease in her mother; Hypertension in her daughter and another family member; Other in an other family member; Stroke in her brother and father; Thyroid disease in her daughter  Diet: AM - 2 cups of  1/2 caff coffee, cereal with fruit., sometimes an egg and toast. Lunch - PB crackers. Dinner - salad, chicken  Home BP readings:  Variable, but follow a trend one reading of 170/53 and one in the 150s in the early AM before morning medications  Rest of the readings 120-130s/60s 10/7 and 10/21 hypotensive episodes, BP 100-117/45-60s  Wt Readings from Last 3 Encounters:  04/26/21 147 lb 9.6 oz (67 kg)  04/19/21 147 lb (66.7 kg)  04/17/21 147 lb 0.8 oz (66.7 kg)   BP Readings from Last 3 Encounters:  04/28/21 (!) 152/87  04/26/21 (!) 144/68  04/18/21 (!) 173/57   Pulse Readings from Last 3 Encounters:  04/28/21 82  04/26/21 68  04/18/21 79    Renal function: Estimated Creatinine  Clearance: 45.9 mL/min (by C-G formula based on SCr of 0.79 mg/dL).  Past Medical History:  Diagnosis Date   Chronic stable angina [facility])    Coronary artery disease cardiologist--- dr Burt Knack   hx NSTEMI  w/ cardiac cath 03-16-2005 PCI with DES to LCx;   cardiac cath 06-27-2005  PCI with DES to RCA with residual dx LAD manage medcially;  lexiscan 01-26-20211 normal no ischemia, ef 70%;  stress echo w/ dobutamine 10-24-2011 negative ishcmeia , normal ef // Myoview 2/22: EF 67, no ischemia or infarction, no TID, low risk    Diabetes mellitus type 2, diet-controlled ([place])    followed by pcp  (10-19-2020  pt stated checks daily in am,  fasting blood surgar--- 115--120s)   DOE (dyspnea on exertion)    per pt "when I over do",  ok with household chores   Echocardiogram 08/2020    Echocardiogram 2/22: EF 55-60, no RWMA, mild LVH, Gr 2 DD, GLS-21.7%, normal RVSF, trivial MR, RVSP 39.5   Edema of right lower extremity    GERD (gastroesophageal reflux disease)    Hiatal hernia    recurrence,  hx HH repair 1989   History of cervical cancer    s/p  vaginal hysterectomy   History of DVT of lower extremity 2016   11-29-2014 post op right TKA of right lower extremity and completed xarelto    History of esophageal stricture    hx s/p dilatation's   History of gastric ulcer 2005 approx.   History of palpitations 2010   event monitor 07-07-2009 showed NSR w/ freq. SVT ectopies with short runs, rare PVCs   History of TIA (transient ischemic attack) 06/1999   12-15-2019  per pt had several TIA between 12/ 2000 to 02/ 2001 , was sent to specialist '@[facility]' , had test that was normal (10-19-2020 pt stated no TIAs since ) but has residual of essential tremors of right arm/ hand   Hypertension    Intermittent palpitations    IT band syndrome    Migraine    "ice pick headche lasts about 30 seconds"   Mixed hyperlipidemia    Mixed incontinence urge and stress    Multiple thyroid nodules    followed by pcp---    ultrasound 11-22-2014 no bx   (12-15-2019 per pt had a endocrinologist and was told did not need bx)   OA (osteoarthritis)    knees, elbow, hip, ankles   Occasional tremors    right arm/ hand  s/p TIA residual 2000   Osteoporosis    taking vitamin d   Peroneal DVT (deep venous thrombosis) ([place]) 12/22/2014   Right bundle branch block (RBBB) with left anterior fascicular block (LAFB)    RLS (restless legs syndrome)  S/P drug eluting coronary stent placement 2006   03-16-2005  PCI x1 DES to LCx;   06-27-2005  PCI x1 DES to RCA   Urinary retention    post op sling prodecure on 10-27-2020, has foley cathether    Current Outpatient Medications on File Prior to Visit  Medication Sig Dispense Refill   acetaminophen (TYLENOL) 500 MG tablet Take 1,000 mg by mouth every 6 (six) hours as needed for moderate pain.     amLODipine (NORVASC) 10 MG tablet TAKE 1 TABLET EVERY DAY (Patient taking differently: Take 10 mg by mouth daily.) 90 tablet 1   Ascorbic Acid (VITAMIN C) 500 MG CHEW Chew 500 mg by mouth daily.     aspirin EC 81 MG tablet Take 1 tablet (81 mg total) by mouth daily.     Blood Glucose Monitoring Suppl (TRUE METRIX AIR GLUCOSE METER) w/Device KIT USE TO CHECK BLOOD SUGAR ONCE DAILY AND AS NEEDED.  DX CODE E11.9 1 kit 0   cephALEXin (KEFLEX) 250 MG capsule Take 1 capsule (250 mg total) by mouth daily. 30 capsule 5   Cholecalciferol (VITAMIN D) 50 MCG (2000 UT) CAPS Take 2,000 Units by mouth daily.      diclofenac Sodium (VOLTAREN) 1 % GEL Apply 1 application topically 4 (four) times daily as needed (hip pain).     dicyclomine (BENTYL) 20 MG tablet Take 1 tablet (20 mg total) by mouth 2 (two) times daily as needed for spasms (abdominal cramping). 20 tablet 0   famotidine (PEPCID) 20 MG tablet Take 20 mg by mouth at bedtime as needed for heartburn or indigestion.     furosemide (LASIX) 20 MG tablet Take 1 tablet (20 mg total) by mouth daily. (Patient taking differently: Take 20 mg by mouth  daily as needed for fluid.) 30 tablet 11   glucose blood (TRUE METRIX BLOOD GLUCOSE TEST) test strip USE TO CHECK BLOOD SUGAR ONCE A DAY OR AS NEEDED.  DX CODE: E11.9 200 each 1   isosorbide mononitrate (IMDUR) 60 MG 24 hr tablet Take 1.5 tablets (90 mg total) by mouth daily. 135 tablet 3   losartan (COZAAR) 100 MG tablet Take 0.5 tablets (50 mg total) by mouth 2 (two) times daily. 90 tablet 3   magnesium oxide (MAG-OX) 400 MG tablet Take 400 mg by mouth at bedtime.     metoprolol succinate (TOPROL-XL) 25 MG 24 hr tablet Take 1 tablet (25 mg total) by mouth daily. 90 tablet 3   nitroGLYCERIN (NITROSTAT) 0.4 MG SL tablet Place 1 tablet (0.4 mg total) under the tongue every 5 (five) minutes as needed for chest pain. 25 tablet 11   omeprazole (PRILOSEC) 20 MG capsule Take 1 capsule (20 mg total) by mouth in the morning.     ondansetron (ZOFRAN-ODT) 4 MG disintegrating tablet Take 4 mg by mouth every 8 (eight) hours as needed for nausea or vomiting.     polyethylene glycol (MIRALAX / GLYCOLAX) 17 g packet Take 17 g by mouth daily as needed for mild constipation. 14 each 0   potassium chloride SA (KLOR-CON) 20 MEQ tablet Take 0.5 tablets (10 mEq total) by mouth daily. 45 tablet 2   pramipexole (MIRAPEX) 0.25 MG tablet TAKE 1 TABLET TWICE DAILY (Patient taking differently: Take 0.25 mg by mouth 2 (two) times daily.) 180 tablet 1   simvastatin (ZOCOR) 20 MG tablet TAKE 1 TABLET AT BEDTIME (Patient taking differently: Take 20 mg by mouth daily at 6 PM.) 90 tablet 3   traMADol (  ULTRAM) 50 MG tablet Take 1 tablet (50 mg total) by mouth every 6 (six) hours as needed for severe pain. 15 tablet 0   TRUEplus Lancets 33G MISC USE TO CHECK BLOOD SUGAR ONCE DAILY AND AS NEEDED.  DX CODE: E11.9 200 each 1   Zinc 50 MG TABS Take 50 mg by mouth daily.     No current facility-administered medications on file prior to visit.    Allergies  Allergen Reactions   Baclofen Other (See Comments)    Hyperactivity     Nitrofurantoin Rash     Assessment/Plan:  1. Hypertension -  Blood pressure of 158/72 mmHg still above goal of < 130/80 mmHg. Based upon prior visits and home BP log suspect some degree of white coat hypertension. Her SBP overall is generally in the 120-130s for most of the day indicating better control overall. Both of the hypotensive episodes happened around 11am, so suspect this is due to her taking all her morning BP medications at the same time causing her to go low. Patient was able to identify episodes of hypotension stating she felt dizzy and had to lie down. High readings on the patients home log were mostly before 7:30am when she takes her morning medications. Instructed the patient to take her amlodipine 10 mg daily in the evening and continue taking all her other blood pressure medications as is. Ideally this will prevent her pressure from being elevated overnight and her from having hypotensive episodes mid-morning, but decreasing the amount of medications in the morning. Will follow-up with the patient over phone in 1 week to see how her BP is doing based upon log readings with her home BP cuff. If her BP is still elevated at this time would switch from losartan 50 mg BID to valsartan 320 mg daily in the PM for additional BP lowering.   Patient seen with Fuller Canada, PharmD, BCACP, CPP.   Cathrine Muster, PharmD PGY2 Cardiology Pharmacy Resident

## 2021-05-01 NOTE — Patient Instructions (Addendum)
Today you blood pressure was 153/72 mmHg, which is above your goal of < 130/80 mmHg. You had two episodes of low blood pressure noted on 10/7 and 10/21 around 11 am. Your wrist blood pressure cuff was validated today and was within 5 points of our reading.   Please starting taking your amlodipine 10 mg daily at night. This should help lower your blood pressure that has been elevated in the morning and prevent episodes of low blood pressure mid-morning by decreasing the number of medications you are taking in the morning. Continue taking all your other medications as you have been.  We will call you in one week to see how your blood pressure is doing. Please continue to keep a log and make sure you are sitting before checking your pressure.

## 2021-05-03 ENCOUNTER — Ambulatory Visit (INDEPENDENT_AMBULATORY_CARE_PROVIDER_SITE_OTHER): Payer: Medicare HMO | Admitting: *Deleted

## 2021-05-03 ENCOUNTER — Other Ambulatory Visit: Payer: Self-pay

## 2021-05-03 VITALS — BP 181/70 | HR 70

## 2021-05-03 DIAGNOSIS — N3281 Overactive bladder: Secondary | ICD-10-CM

## 2021-05-03 NOTE — Progress Notes (Signed)
PTNS VISIT   CC:  Overactive bladder   86 y.o. with refractory overactive bladder who presents for percutaneous tibial nerve stimulation. The patient presents for PTNS session # 10.   Procedure: The patient was placed in the sitting position and the left lower extremity was prepped in the usual fashion. The PTNS needle was then inserted at a 60 degree angle, 5 cm cephalad and 2 cm posterior to the medial malleolus. The PTNS unit was then programmed and an optimal response was noted at a setting of  6 milliamps. The PTNS stimulation was then performed at this setting for 30 minutes without incident and the patient tolerated the procedure well. The needle was removed and hemostasis was noted.    The pt will return in 1 week for PTNS session # 11. All questions were answered.   Ezekiel Ina, RN

## 2021-05-03 NOTE — Telephone Encounter (Signed)
error 

## 2021-05-03 NOTE — Addendum Note (Signed)
Addended by: Jaquita Folds on: 05/03/2021 05:37 PM   Modules accepted: Level of Service

## 2021-05-05 ENCOUNTER — Encounter: Payer: Self-pay | Admitting: Family Medicine

## 2021-05-05 ENCOUNTER — Other Ambulatory Visit: Payer: Self-pay | Admitting: Family Medicine

## 2021-05-05 MED ORDER — OMEPRAZOLE 20 MG PO CPDR: 20.0000 mg | DELAYED_RELEASE_CAPSULE | Freq: Every morning | ORAL | 1 refills | Status: DC

## 2021-05-08 ENCOUNTER — Telehealth: Payer: Self-pay

## 2021-05-08 NOTE — Telephone Encounter (Addendum)
Calling to follow-up with patient after last visit to check on BP readings. LVM to call back. Last visit amlodipine was switched to the evening to help with elevated AM blood pressures. (Readings in afternoon are good). Calling to follow-up on if high AM readings have improved and ensure no hypotensive episodes. If BP is still elevated would switch  losartan 50 BID to valsartan 320 mg daily.

## 2021-05-10 ENCOUNTER — Other Ambulatory Visit: Payer: Self-pay

## 2021-05-10 ENCOUNTER — Ambulatory Visit (INDEPENDENT_AMBULATORY_CARE_PROVIDER_SITE_OTHER): Payer: Medicare HMO | Admitting: Obstetrics and Gynecology

## 2021-05-10 ENCOUNTER — Ambulatory Visit: Payer: Medicare HMO

## 2021-05-10 VITALS — BP 135/66 | HR 53

## 2021-05-10 DIAGNOSIS — N3281 Overactive bladder: Secondary | ICD-10-CM | POA: Diagnosis not present

## 2021-05-10 NOTE — Progress Notes (Signed)
PTNS VISIT   CC:  Overactive bladder   86 y.o. with refractory overactive bladder who presents for percutaneous tibial nerve stimulation. The patient presents for PTNS session # 11.   Procedure: The patient was placed in the sitting position and the left lower extremity was prepped in the usual fashion. The PTNS needle was then inserted at a 60 degree angle, 5 cm cephalad and 2 cm posterior to the medial malleolus. The PTNS unit was then programmed and an optimal response was noted at a setting of 8 milliamps. The PTNS stimulation was then performed at this setting for 30 minutes without incident and the patient tolerated the procedure well. The needle was removed and hemostasis was noted.    The pt will return in 1 week for PTNS session # 12.    Jaquita Folds, MD

## 2021-05-12 ENCOUNTER — Telehealth: Payer: Self-pay | Admitting: Pharmacist

## 2021-05-12 DIAGNOSIS — I10 Essential (primary) hypertension: Secondary | ICD-10-CM

## 2021-05-12 NOTE — Telephone Encounter (Signed)
{  Patient called reporting that her HR has dropped to 44 today. (Normal HR is 60's. This is the first time it has been that low. I asked her to get up and move around to see if it goes up. HR increased to 63.  I have asked patient to skip metoprolol tonight and then decrease to 12.5mg  daily starting tomorrow night  I also discussed with patient her BP as we have been following this as well  146/60, 147/74 (AM reading) 108/48, 100/46 (PM readings) doesn't drop low every afternoon- once every few weeks- feels lightheaded and dizzy when this happens. Has to lie down or she will pass out. Has to lie down for about an hour before she feels better. There is no pattern to when this might happen.  We moved her amlodipine to the PM to see if this improved her AM readings, but no real improvement.  I will send to Dr. Burt Knack for recommendations on dizzy spells.She says her head feels like it did when she had a TIA, but she did have weakness on her right side during her TIA and this does not happen with these episodes. She has not seen neurology in a long time. Doesn't remember who she saw.  I think we will need to allow for a higher BP to prevent any more dizzy spells, at least in the mean time.

## 2021-05-14 NOTE — Telephone Encounter (Signed)
I would probably have her hold metoprolol altogether and liberalize her BP goal considering her advanced age. Thanks for helping manage her BP.

## 2021-05-15 ENCOUNTER — Telehealth: Payer: Self-pay

## 2021-05-15 NOTE — Telephone Encounter (Addendum)
Called and spoke with the patient today for follow-up after having low HR in the 40s on 11/4. She was instructed to decrease her metoprolol from 25 mg daily to 12.5 mg daily. Reviewed her BP readings over the phone as follows:  11/6  AM BP 158/79, HR 57- after took medications AM BP 120/53, HR 56  11/7 BP 164/75 HR 63, before medications BP 124/54, HR 57  Patient does not report any more episodes of low HR or BP since. Instructed her to continue taking metoprolol 12.5 mg daily and call us if she has any more episodes of low BP and HR. Discussed that if this were to happen again we would consider stopping metoprolol.   Patient reports she needs a refill of her potassium. She takes 79mEq daily. Potassium was 4.4 on 04/26/21 and 5 on 04/16/21. Will stop low dose K supplement since no longer needed. Scheduled follow up BMET during her appointment with Dr. Burt Knack in December to ensure K remains in normal range.

## 2021-05-15 NOTE — Chronic Care Management (AMB) (Signed)
Chronic Care Management Pharmacy Assistant   Name: Heather [name]  MRN: [id number] DOB: 01/23/1935   Reason for Encounter: Disease State Hypertension    Recent office visits:  04/26/21 [name] PA - Seen for SBO (small bowel obstruction) - Labs ordered - No mediation changes noted - No follow up noted  04/10/21 [name] CPP - Seen for hypertension - Labs ordered - Changed taking losartan 105m (1/2 tablet) in the AM and 5102min the PM - No follow up noted   Recent consult visits:  05/10/21 Obstetrics and Gynecology - [name] - Seen for overactive bladder - Follow up in 1 week  05/01/21 Cardiology - [name]  Nacion RPH - Amlodipine changed to 10 mg taken at night - No follow up noted  04/28/21 Obstetrics and Gynecology - [name] - Seen for overactive bladder - No mediation changes noted - Follow up in 1 week  04/19/21 Obstetrics and Gynecology - [name] - Seen for overactive bladder - No mediation changes noted - Follow up in 1 week  04/12/21 Obstetrics and Gynecology - [name] - Seen for overactive bladder - No mediation changes noted - Follow up in 1 week  03/30/21 Sports medicine - [name] - Seen for Greater trochanteric pain - METHYLPREDNISOLONE 40 MG injection given in office - Return in 4 weeks  03/22/21 Obstetrics and Gynecology - [name] - Seen for overactive bladder - No mediation changes noted - Follow up in 1 week  03/21/21 Pharmacist - [name] CPP - Seen for hypertension - Increase your losartan back to 10018m1 full tablet) once daily - Restart metoprolol at a lower dose of 26m2m/2 of your 50mg27mlet) once daily - Follow up in 3 weeks  03/15/21 Obstetrics and Gynecology - [name] Seen for overactive bladder - No mediation changes noted - Follow up in 1 week   Hospital visits:   Admitted to the hospital on 04/14/21 due to Generalized abdominal pain. Discharge  date was 04/15/21. [facility]  [facility]?? Dicyclomine 20 mg, 1 tablet 2 times daily PRN   Medication Changes at Hospital Discharge: ondansetron 4 mg, Take 1 tablet (4 mg total) by mouth every 8 (eight) hours as needed.    Medications Discontinued at Hospital Discharge: N/a   Medications that remain the same after Hospital Discharge:??  -All other medications will remain the same.    Medications: Outpatient Encounter Medications as of 05/15/2021  Medication Sig Note   acetaminophen (TYLENOL) 500 MG tablet Take 1,000 mg by mouth every 6 (six) hours as needed for moderate pain.    amLODipine (NORVASC) 10 MG tablet TAKE 1 TABLET EVERY DAY (Patient taking differently: Take 10 mg by mouth daily.)    Ascorbic Acid (VITAMIN C) 500 MG CHEW Chew 500 mg by mouth daily. 04/17/2021: Not compliant   aspirin EC 81 MG tablet Take 1 tablet (81 mg total) by mouth daily.    Blood Glucose Monitoring Suppl (TRUE METRIX AIR GLUCOSE METER) w/Device KIT USE TO CHECK BLOOD SUGAR ONCE DAILY AND AS NEEDED.  DX CODE E11.9    cephALEXin (KEFLEX) 250 MG capsule Take 1 capsule (250 mg total) by mouth daily. 04/17/2021: Continuous for 6 months   Cholecalciferol (VITAMIN D) 50 MCG (2000 UT) CAPS Take 2,000 Units by mouth daily.     diclofenac Sodium (VOLTAREN)  1 % GEL Apply 1 application topically 4 (four) times daily as needed (hip pain).    dicyclomine (BENTYL) 20 MG tablet Take 1 tablet (20 mg total) by mouth 2 (two) times daily as needed for spasms (abdominal cramping).    famotidine (PEPCID) 20 MG tablet Take 20 mg by mouth at bedtime as needed for heartburn or indigestion.    furosemide (LASIX) 20 MG tablet Take 1 tablet (20 mg total) by mouth daily. (Patient taking differently: Take 20 mg by mouth daily as needed for fluid.)    glucose blood (TRUE METRIX BLOOD GLUCOSE TEST) test strip USE TO CHECK BLOOD SUGAR ONCE A DAY OR AS  NEEDED.  DX CODE: E11.9    isosorbide mononitrate (IMDUR) 60 MG 24 hr tablet Take 1.5 tablets (90 mg total) by mouth daily.    losartan (COZAAR) 100 MG tablet Take 0.5 tablets (50 mg total) by mouth 2 (two) times daily.    magnesium oxide (MAG-OX) 400 MG tablet Take 400 mg by mouth at bedtime.    metoprolol succinate (TOPROL-XL) 25 MG 24 hr tablet Take 0.5 tablets (12.5 mg total) by mouth daily.    nitroGLYCERIN (NITROSTAT) 0.4 MG SL tablet Place 1 tablet (0.4 mg total) under the tongue every 5 (five) minutes as needed for chest pain.    omeprazole (PRILOSEC) 20 MG capsule Take 1 capsule (20 mg total) by mouth in the morning.    ondansetron (ZOFRAN-ODT) 4 MG disintegrating tablet Take 4 mg by mouth every 8 (eight) hours as needed for nausea or vomiting.    polyethylene glycol (MIRALAX / GLYCOLAX) 17 g packet Take 17 g by mouth daily as needed for mild constipation.    potassium chloride SA (KLOR-CON) 20 MEQ tablet Take 0.5 tablets (10 mEq total) by mouth daily.    pramipexole (MIRAPEX) 0.25 MG tablet TAKE 1 TABLET TWICE DAILY (Patient taking differently: Take 0.25 mg by mouth 2 (two) times daily.)    simvastatin (ZOCOR) 20 MG tablet TAKE 1 TABLET AT BEDTIME (Patient taking differently: Take 20 mg by mouth daily at 6 PM.)    traMADol (ULTRAM) 50 MG tablet Take 1 tablet (50 mg total) by mouth every 6 (six) hours as needed for severe pain.    TRUEplus Lancets 33G MISC USE TO CHECK BLOOD SUGAR ONCE DAILY AND AS NEEDED.  DX CODE: E11.9    Zinc 50 MG TABS Take 50 mg by mouth daily.    No facility-administered encounter medications on file as of 05/15/2021.   Reviewed chart prior to disease state call. Spoke with patient regarding BP  Recent Office Vitals: BP Readings from Last 3 Encounters:  05/10/21 135/66  05/03/21 (!) 181/70  05/01/21 (!) 158/72   Pulse Readings from Last 3 Encounters:  05/10/21 (!) 53  05/03/21 70  05/01/21 75    Wt Readings from Last 3 Encounters:  04/26/21 147 lb 9.6  oz (67 kg)  04/19/21 147 lb (66.7 kg)  04/17/21 147 lb 0.8 oz (66.7 kg)     Kidney Function Lab Results  Component Value Date/Time   CREATININE 0.79 04/26/2021 11:33 AM   CREATININE 0.73 04/16/2021 05:00 AM   CREATININE 0.95 11/10/2013 03:28 AM   CREATININE 1.03 01/17/2012 11:03 AM   GFR 68.63 04/26/2021 11:33 AM   GFRNONAA >60 04/16/2021 05:00 AM   GFRAA 57 (L) 08/19/2020 01:51 PM    BMP Latest Ref Rng & Units 04/26/2021 04/16/2021 04/15/2021  Glucose 70 - 99 mg/dL 131(H) 103(H) 147(H)  BUN 6 -  23 mg/dL '16 15 13  ' Creatinine 0.40 - 1.20 mg/dL 0.79 0.73 0.97  BUN/Creat Ratio 12 - 28 - - -  Sodium 135 - 145 mEq/L 138 141 137  Potassium 3.5 - 5.1 mEq/L 4.4 5.0 4.2  Chloride 96 - 112 mEq/L 103 104 100  CO2 19 - 32 mEq/L '28 25 28  ' Calcium 8.4 - 10.5 mg/dL 9.3 9.0 9.8    Current antihypertensive regimen:  Losartan 100 mg daily metoprolol succinate 12.5 mg Taking half a tablet daily per Provider note on 05/12/21. Patient confirmed  Amlodipine 11m daily Furosemide 247mdaily as needed for edema / weight gain of more   How often are you checking your Blood Pressure? daily Current home BP readings: 05/15/21 @ 164/75 morning reading  What recent interventions/DTPs have been made by any provider to improve Blood Pressure control since last CPP Visit: start taking your amlodipine 10 mg daily at night. This should help lower your blood pressure that has been elevated in the morning and prevent episodes of low blood pressure mid-morning by decreasing the number of medications you are taking in the morning. Any recent hospitalizations or ED visits since last visit with CPP? Yes What diet changes have been made to improve Blood Pressure Control?  Patient states that she watches her salk intake and measures the salt in her hand if she does need to add salt.  What exercise is being done to improve your Blood Pressure Control?  Patient states that she is not exercising lately due to not yet being  cleared to lift past 20 pounds.   Adherence Review: Is the patient currently on ACE/ARB medication? Yes Does the patient have >5 day gap between last estimated fill dates? No  Misc comment: Spoke with Ms. HaWhittierwhom is very pleasant woman. 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 She said that she repairing it for her son to wear this year. Ms. HaGarrotates that her blood pressure drops randomly. She has not had a b/p drop in November but states that it did drop about 3 times in October. On October 31 it was 124/49 at 3:20 pm and continued to drop to 108/51 and 106/56. On October 27 it was 158/71 in the morning and it dropped to 119/51 in the afternoon. She states that she can feel when her blood pressure is dropping so she has to lie down and wait for an hour or an hour and a half until she feels better. She states that there is no pattern or indicator as to why her blood pressure drops. Her blood sugar is 171 this morning and 117 yesterday morning. Her heart rate dropped last Friday to 444nd she contacted her pharmacist at her cardiologist which recommended taking half her metoprolol at night, They are also aware of her low b/p readings. Ms HaWarwicktates that she will call again if she is experiencing another episode of low blood pressure.   Star Rating Drugs: losartan (COZAAR) 100 MG tablet 03/22/2021 90 DS, 02/23/2021 90 DS, 07/07/202290 DS  simvastatin (ZOCOR) 20 MG tablet 04/11/2021 90 DS, 07/21/202290 DS   DeAndee PolesCMA

## 2021-05-17 ENCOUNTER — Other Ambulatory Visit: Payer: Self-pay

## 2021-05-17 ENCOUNTER — Ambulatory Visit (INDEPENDENT_AMBULATORY_CARE_PROVIDER_SITE_OTHER): Payer: Medicare HMO | Admitting: Obstetrics and Gynecology

## 2021-05-17 VITALS — BP 171/70 | HR 77 | Wt 147.0 lb

## 2021-05-17 DIAGNOSIS — N3281 Overactive bladder: Secondary | ICD-10-CM | POA: Diagnosis not present

## 2021-05-17 NOTE — Progress Notes (Signed)
PTNS VISIT   CC:  Overactive bladder   86 y.o. with refractory overactive bladder who presents for percutaneous tibial nerve stimulation. The patient presents for PTNS session # 12.  She feels she has seen great improvement in her symptoms. Still has urgency but she is now able to make it to the bathroom without leakage. Only wears one pad during the day. Also having less leakage and urination at night.    Procedure: The patient was placed in the sitting position and the left lower extremity was prepped in the usual fashion. The PTNS needle was then inserted at a 60 degree angle, 5 cm cephalad and 2 cm posterior to the medial malleolus. The PTNS unit was then programmed and an optimal response was noted at a setting of 4 milliamps. The PTNS stimulation was then performed at this setting for 30 minutes without incident and the patient tolerated the procedure well. The needle was removed and hemostasis was noted.    The pt will return in 1 month for maintenance PTNS.     Jaquita Folds, MD

## 2021-05-18 DIAGNOSIS — Z961 Presence of intraocular lens: Secondary | ICD-10-CM | POA: Diagnosis not present

## 2021-05-18 DIAGNOSIS — H43813 Vitreous degeneration, bilateral: Secondary | ICD-10-CM | POA: Diagnosis not present

## 2021-05-20 NOTE — Telephone Encounter (Signed)
Reviewed notes about blood pressure. Blood pressure is managed by her cardiologist and clinical pharmacist in her cardiologist's office. Patient has been in touch with their office regarding blood pressure and HR readings and they has provided recommendations.  Patient will see cardiologist in December.

## 2021-05-27 ENCOUNTER — Other Ambulatory Visit: Payer: Self-pay | Admitting: Family Medicine

## 2021-06-05 ENCOUNTER — Encounter: Payer: Self-pay | Admitting: Gastroenterology

## 2021-06-05 ENCOUNTER — Ambulatory Visit (INDEPENDENT_AMBULATORY_CARE_PROVIDER_SITE_OTHER): Payer: Medicare HMO | Admitting: Gastroenterology

## 2021-06-05 VITALS — BP 146/66 | HR 80 | Ht 61.0 in | Wt 148.8 lb

## 2021-06-05 DIAGNOSIS — K219 Gastro-esophageal reflux disease without esophagitis: Secondary | ICD-10-CM

## 2021-06-05 DIAGNOSIS — R933 Abnormal findings on diagnostic imaging of other parts of digestive tract: Secondary | ICD-10-CM

## 2021-06-05 DIAGNOSIS — R1310 Dysphagia, unspecified: Secondary | ICD-10-CM

## 2021-06-05 DIAGNOSIS — Z79899 Other long term (current) drug therapy: Secondary | ICD-10-CM

## 2021-06-05 NOTE — Progress Notes (Signed)
HPI :  86 year old female here for a follow-up visit.  She was last seen in May of this year with intermittent right upper quadrant pain.  She has been seen for this previously and we suspected biliary colic.  She was referred to see surgery at that visit.  She unfortunately was admitted with an incarcerated incisional hernia in July 2022 and had her gallbladder removed during that visit as well.  After her discharge she was unfortunately readmitted for fluid overload from July 23 to July 28.  She had complained of some dysphagia at that visit which led to a modified barium swallow as well as a barium study as outlined below.  There was concern for an abnormality in her mid to distal esophagus and endoscopy was recommended at some point time.  Unfortunately in late July to early August she was admitted for cellulitis at her wound incision site, required drainage, admitted for sepsis for 6 days and eventually discharged.  She eventually recovered from that and unfortunately admitted again in early October of this year for an incarcerated inguinal hernia that was repaired.  She states she has finally recovered from these issues and appears to be doing well at this time.  She has had her blood pressure medications tweaked by her cardiology team but seems to be tolerating it okay.  She denies any chest pain or shortness of breath at rest.  She has rare exertional dyspnea if she overexerts herself.  She has been taking omeprazole 20 mg once daily and for the most part it controls her reflux symptoms.  She has been on this for a long time in light of her moderate sized hiatal hernia and ongoing reflux symptoms.  If she has trigger foods that can sometimes make her symptoms worse.  She denies any nausea or vomiting on a routine basis.  She does continue to have dysphagia periodically located in her throat.  She states solids can often get stuck there where she has to drink liquids to try to push things down.   Rare dysphagia to liquids.  She only feels this in her throat area and not in her lower chest or esophagus.  She denies any abdominal pains otherwise since her operations.  She denies any blood thinner use, only taking 81 mg aspirin daily.  Her last focal cardiogram is as outlined below, EF is normal.  Her daughter is present today and inquires about a remote neck ultrasound she had in 2016 which showed a thyroid nodule.  She was seen by endocrine at that time and it was thought that it was indeterminate at that time if she needed a biopsy, that was declined and follow-up was recommended for 1 year which she did not have. She had a reflux surgery in 1988 - she had paraesophageal hernia repaired along with this.    Prior workup:  Colonoscopy 06/2011 - Dr. Earlean Shawl, no report available today, she thinks it was normal EGD 05/2010 - Dr. Earlean Shawl, distal esophageal narrowing s/p dilation to 78m, hiatal hernia, inflamed gastric polyps, gastritis - HP negative    EGD 04/25/17 -  - A 4 cm hiatal hernia was present. - The exam of the esophagus was otherwise normal. No stenosis / stricture appreciated. - Diffuse mildly erythematous mucosa was found in the gastric antrum without focal ulceration. Biopsies were taken with a cold forceps from the antrum, body, and incisura for Helicobacter pylori testing. - A few small sessile polyps were found in the gastric body. Two  representative polyps were removed with a cold biopsy forceps. Resection and retrieval were complete. - The exam of the stomach was otherwise normal. - The duodenal bulb and second portion of the duodenum were normal.  1. Surgical [P], gastric antrum and gastric body - REACTIVE GASTROPATHY. - NEGATIVE FOR HELICOBACTER PYLORI. - NO INTESTINAL METAPLASIA, DYSPLASIA, OR MALIGNANCY. 2. Surgical [P], stomach, polyp (multiple) - HYPERPLASTIC POLYP(S). - NEGATIVE FOR HELICOBACTER PYLORI. - NO INTESTINAL METAPLASIA, DYSPLASIA, OR MALIGNANCY.   Echo  01/29/21 - EF 60-65%  CT abdomen / pelvis 04/15/21 - IMPRESSION: 1. Left inguinal hernia containing small bowel. Since the previous study, there is interval development of small bowel obstruction at the level of the hernia. Distal small bowel are decompressed. 2. Large esophageal hiatal hernia. Wall thickening of the distal esophagus suggesting reflux disease. 3. Diffuse aortic atherosclerosis. 4. Diverticulosis of the sigmoid colon without evidence of acute diverticulitis. 5. Gas in the bladder could indicate instrumentation or infection. 6. Anterior abdominal wall hernias containing fat, likely postsurgical.   Barium swallow 01/31/21:IMPRESSION: Somewhat limited examination due to the patient's limited ability to reposition. Small contour irregularity along the right lateral aspect of the mid-to-distal esophagus. Endoscopy is recommended to exclude a mass/lesion at this site. Redemonstrated moderate-sized hiatal hernia. Intermittent and mildly delayed passage of contrast from the hiatal hernia into the more distal stomach. A swallowed 13 mm barium tablet freely passed into the hiatal hernia. However, the tablet did not pass into the more distal stomach, despite a prolonged period of observation.   Prominent intermittent esophageal dysmotility with tertiary contractions.    Past Medical History:  Diagnosis Date   Chronic stable angina [facility])    Coronary artery disease cardiologist--- dr Burt Knack   hx NSTEMI  w/ cardiac cath 03-16-2005 PCI with DES to LCx;   cardiac cath 06-27-2005  PCI with DES to RCA with residual dx LAD manage medcially;  lexiscan 01-26-20211 normal no ischemia, ef 70%;  stress echo w/ dobutamine 10-24-2011 negative ishcmeia , normal ef // Myoview 2/22: EF 67, no ischemia or infarction, no TID, low risk    Diabetes mellitus type 2, diet-controlled ([place])    followed by pcp  (10-19-2020  pt stated checks daily in am,  fasting blood surgar--- 115--120s)   DOE  (dyspnea on exertion)    per pt "when I over do",  ok with household chores   Echocardiogram 08/2020    Echocardiogram 2/22: EF 55-60, no RWMA, mild LVH, Gr 2 DD, GLS-21.7%, normal RVSF, trivial MR, RVSP 39.5   Edema of right lower extremity    GERD (gastroesophageal reflux disease)    Hiatal hernia    recurrence,  hx HH repair 1989   History of cervical cancer    s/p  vaginal hysterectomy   History of DVT of lower extremity 2016   11-29-2014 post op right TKA of right lower extremity and completed xarelto    History of esophageal stricture    hx s/p dilatation's   History of gastric ulcer 2005 approx.   History of palpitations 2010   event monitor 07-07-2009 showed NSR w/ freq. SVT ectopies with short runs, rare PVCs   History of TIA (transient ischemic attack) 06/1999   12-15-2019  per pt had several TIA between 12/ 2000 to 02/ 2001 , was sent to specialist '@[facility]' , had test that was normal (10-19-2020 pt stated no TIAs since ) but has residual of essential tremors of right arm/ hand   Hypertension    Intermittent palpitations  IT band syndrome    Migraine    "ice pick headche lasts about 30 seconds"   Mixed hyperlipidemia    Mixed incontinence urge and stress    Multiple thyroid nodules    followed by pcp---   ultrasound 11-22-2014 no bx   (12-15-2019 per pt had a endocrinologist and was told did not need bx)   OA (osteoarthritis)    knees, elbow, hip, ankles   Occasional tremors    right arm/ hand  s/p TIA residual 2000   Osteoporosis    taking vitamin d   Peroneal DVT (deep venous thrombosis) ([place]) 12/22/2014   Right bundle branch block (RBBB) with left anterior fascicular block (LAFB)    RLS (restless legs syndrome)    S/P drug eluting coronary stent placement 2006   03-16-2005  PCI x1 DES to LCx;   06-27-2005  PCI x1 DES to RCA   Urinary retention    post op sling prodecure on 10-27-2020, has foley cathether     Past Surgical History:  Procedure Laterality Date    ANTERIOR AND POSTERIOR REPAIR N/A 12/22/2019   Procedure: ANTERIOR (CYSTOCELE)  REPAIR;  Surgeon: Janyth Contes, MD;  Location: [place];  Service: Gynecology;  Laterality: N/A;   ANTERIOR AND POSTERIOR REPAIR WITH SACROSPINOUS FIXATION N/A 10/27/2020   Procedure: SACROSPINOUS LIGAMENT FIXATION;  Surgeon: Jaquita Folds, MD;  Location: [facility];  Service: Gynecology;  Laterality: N/A;   BLADDER SUSPENSION N/A 10/27/2020   Procedure: TRANSVAGINAL TAPE (TVT) PROCEDURE;  Surgeon: Jaquita Folds, MD;  Location: [facility];  Service: Gynecology;  Laterality: N/A;   CATARACT EXTRACTION W/ INTRAOCULAR LENS  IMPLANT, BILATERAL  2015   CHOLECYSTECTOMY N/A 01/25/2021   Procedure: LAPAROSCOPIC CHOLECYSTECTOMY WITH INTRAOPERATIVE CHOLANGIOGRAM AND LYSIS OF ADHESIONS;  Surgeon: Michael Boston, MD;  Location: [facility];  Service: General;  Laterality: N/A;   COLONOSCOPY  last one ?   CORONARY ANGIOPLASTY WITH STENT PLACEMENT  03-16-2005   dr Lia Foyer   PCI and DES x1 to LCx   CORONARY ANGIOPLASTY WITH STENT PLACEMENT  06-27-2005  dr Lia Foyer   PCI and DES x1 to RCA with residual disease LAD 70-80% to manage medically   CYSTOSCOPY N/A 10/27/2020   Procedure: CYSTOSCOPY;  Surgeon: Jaquita Folds, MD;  Location: [facility];  Service: Gynecology;  Laterality: N/A;   CYSTOSCOPY N/A 11/09/2020   Procedure: CYSTOSCOPY;  Surgeon: Jaquita Folds, MD;  Location[facility];  Service: Gynecology;  Laterality: N/A;   FOOT SURGERY Left 1990s   left foot stress fracture repair, per pt no hardware   HIATAL HERNIA REPAIR  1989   INCISIONAL HERNIA REPAIR N/A 01/25/2021   Procedure: PRIMARY REPAIR OF INCISIONAL HERNIA;  Surgeon: Michael Boston, MD;  Location: [facility];  Service: General;  Laterality: N/A;   INGUINAL HERNIA REPAIR Left 04/17/2021   Procedure: LAPAROSCOPIC LEFT INGUINAL HERNIA REPAIR WITH MESH;  Surgeon:  Ralene Ok, MD;  Location: [facility];  Service: General;  Laterality: Left;   KNEE ARTHROSCOPY Bilateral right ?/   left x2 , last one 09-12-2009 @ [facility]   PUBOVAGINAL SLING N/A 11/09/2020   Procedure: REVISION OF PUBO-VAGINAL SLING;  Surgeon: Jaquita Folds, MD;  Location: [facility];  Service: Gynecology;  Laterality: N/A;   RECTOCELE REPAIR N/A 04/20/2020   Procedure: POSTERIOR REPAIR (RECTOCELE);  Surgeon: Janyth Contes, MD;  Location: [facility];  Service: Gynecology;  Laterality: N/A;  RECTOCELE REPAIR N/A 10/27/2020   Procedure: POSTERIOR REPAIR (RECTOCELE);  Surgeon: Jaquita Folds, MD;  Location: [facility];  Service: Gynecology;  Laterality: N/A;  total time requested for all procedures is 2 hours   TOTAL KNEE ARTHROPLASTY  11/12/2011   Procedure: TOTAL KNEE ARTHROPLASTY;  Surgeon: Lorn Junes, MD;  Location: [place];  Service: Orthopedics;  Laterality: Left;  Dr Noemi Chapel wants 90 minutes for this case   TOTAL KNEE ARTHROPLASTY Right 11/29/2014   Procedure: RIGHT TOTAL KNEE ARTHROPLASTY;  Surgeon: Vickey Huger, MD;  Location: [place];  Service: Orthopedics;  Laterality: Right;   UPPER GASTROINTESTINAL ENDOSCOPY  last one 04-25-2017   with dilatation esophageal stricture and savary dilatation   VAGINAL HYSTERECTOMY  1988    no ovaries removed for bleeeding   Family History  Problem Relation Age of Onset   Stroke Father        family hx of M 1st degree relative <50   Coronary artery disease Mother    Heart disease Mother    Depression Brother    Stroke Brother    Diabetes Brother    Cancer Brother        bladder with mets   Diabetes Daughter        borderline   Hypertension Daughter    Arthritis Other        family hx of   Hypertension Other        family hx of   Other Other        family hx of cardiovascular disorder   Thyroid disease Daughter    Breast cancer Neg Hx    Colon cancer Neg Hx    Anesthesia  problems Neg Hx    Hypotension Neg Hx    Malignant hyperthermia Neg Hx    Pseudochol deficiency Neg Hx    Colon polyps Neg Hx    Esophageal cancer Neg Hx    Rectal cancer Neg Hx    Stomach cancer Neg Hx    Social History   Tobacco Use   Smoking status: Never   Smokeless tobacco: Never  Vaping Use   Vaping Use: Never used  Substance Use Topics   Alcohol use: No    Alcohol/week: 0.0 standard drinks   Drug use: Never   Current Outpatient Medications  Medication Sig Dispense Refill   acetaminophen (TYLENOL) 500 MG tablet Take 1,000 mg by mouth every 6 (six) hours as needed for moderate pain.     amLODipine (NORVASC) 10 MG tablet TAKE 1 TABLET EVERY DAY (Patient taking differently: Take 10 mg by mouth daily.) 90 tablet 1   Ascorbic Acid (VITAMIN C) 500 MG CHEW Chew 500 mg by mouth daily.     aspirin EC 81 MG tablet Take 1 tablet (81 mg total) by mouth daily.     Blood Glucose Monitoring Suppl (TRUE METRIX AIR GLUCOSE METER) w/Device KIT USE TO CHECK BLOOD SUGAR ONCE DAILY AND AS NEEDED.  DX CODE E11.9 1 kit 0   cephALEXin (KEFLEX) 250 MG capsule Take 1 capsule (250 mg total) by mouth daily. 30 capsule 5   Cholecalciferol (VITAMIN D) 50 MCG (2000 UT) CAPS Take 2,000 Units by mouth daily.      diclofenac Sodium (VOLTAREN) 1 % GEL Apply 1 application topically 4 (four) times daily as needed (hip pain).     dicyclomine (BENTYL) 20 MG tablet Take 1 tablet (20 mg total) by mouth 2 (two) times daily as needed for spasms (abdominal cramping).  20 tablet 0   famotidine (PEPCID) 20 MG tablet Take 20 mg by mouth at bedtime as needed for heartburn or indigestion.     furosemide (LASIX) 20 MG tablet Take 1 tablet (20 mg total) by mouth daily. (Patient taking differently: Take 20 mg by mouth daily as needed for fluid.) 30 tablet 11   glucose blood (TRUE METRIX BLOOD GLUCOSE TEST) test strip USE TO CHECK BLOOD SUGAR ONCE A DAY OR AS NEEDED.  DX CODE: E11.9 200 each 1   isosorbide mononitrate (IMDUR) 60  MG 24 hr tablet Take 1.5 tablets (90 mg total) by mouth daily. 135 tablet 3   losartan (COZAAR) 100 MG tablet Take 0.5 tablets (50 mg total) by mouth 2 (two) times daily. 90 tablet 3   magnesium oxide (MAG-OX) 400 MG tablet Take 400 mg by mouth at bedtime.     metoprolol succinate (TOPROL-XL) 25 MG 24 hr tablet Take 0.5 tablets (12.5 mg total) by mouth daily. 45 tablet 3   nitroGLYCERIN (NITROSTAT) 0.4 MG SL tablet Place 1 tablet (0.4 mg total) under the tongue every 5 (five) minutes as needed for chest pain. 25 tablet 11   omeprazole (PRILOSEC) 20 MG capsule TAKE 1 CAPSULE EVERY DAY AS NEEDED (NEED OFFICE VISIT BEFORE ANY FURTHER REFILLS) 90 capsule 1   ondansetron (ZOFRAN-ODT) 4 MG disintegrating tablet Take 4 mg by mouth every 8 (eight) hours as needed for nausea or vomiting.     polyethylene glycol (MIRALAX / GLYCOLAX) 17 g packet Take 17 g by mouth daily as needed for mild constipation. 14 each 0   pramipexole (MIRAPEX) 0.25 MG tablet TAKE 1 TABLET TWICE DAILY (Patient taking differently: Take 0.25 mg by mouth 2 (two) times daily.) 180 tablet 1   simvastatin (ZOCOR) 20 MG tablet TAKE 1 TABLET AT BEDTIME (Patient taking differently: Take 20 mg by mouth daily at 6 PM.) 90 tablet 3   traMADol (ULTRAM) 50 MG tablet Take 1 tablet (50 mg total) by mouth every 6 (six) hours as needed for severe pain. 15 tablet 0   TRUEplus Lancets 33G MISC USE TO CHECK BLOOD SUGAR ONCE DAILY AND AS NEEDED.  DX CODE: E11.9 200 each 1   Zinc 50 MG TABS Take 50 mg by mouth daily.     No current facility-administered medications for this visit.   Allergies  Allergen Reactions   Baclofen Other (See Comments)    Hyperactivity    Nitrofurantoin Rash     Review of Systems: All systems reviewed and negative except where noted in HPI.   Lab Results  Component Value Date   WBC 6.3 04/26/2021   HGB 12.1 04/26/2021   HCT 36.5 04/26/2021   MCV 90.1 04/26/2021   PLT 206.0 04/26/2021    Lab Results  Component  Value Date   CREATININE 0.79 04/26/2021   BUN 16 04/26/2021   NA 138 04/26/2021   K 4.4 04/26/2021   CL 103 04/26/2021   CO2 28 04/26/2021    Lab Results  Component Value Date   ALT 16 04/26/2021   AST 18 04/26/2021   ALKPHOS 95 04/26/2021   BILITOT 0.5 04/26/2021      Physical Exam: BP (!) 146/66   Pulse 80   Ht '5\' 1"'  (1.549 m)   Wt 148 lb 12.8 oz (67.5 kg)   LMP  (LMP Unknown)   BMI 28.12 kg/m  Constitutional: Pleasant,well-developed, female in no acute distress. HEENT: Normocephalic and atraumatic. Conjunctivae are normal. No scleral icterus. Neck supple.  No obvious nodules palpated. Cardiovascular: Normal rate, regular rhythm.  Pulmonary/chest: Effort normal and breath sounds normal.  Abdominal: Soft, nondistended, nontender.   Extremities: trace LE edema bilaterally Lymphadenopathy: No cervical adenopathy noted. Neurological: Alert and oriented to person place and time. Skin: Skin is warm and dry. No rashes noted. Psychiatric: Normal mood and affect. Behavior is normal.   ASSESSMENT AND PLAN: 86 year old female here for reassessment following:  Dysphagia GERD Long-term PPI use Abnormal barium swallow  As above, patient with a complicated recent 6 months with multiple hospital admissions for cholecystectomy, hernia repair, cellulitis/sepsis, fluid overload.  She appears to have finally recovered from these issues and doing well in that regard.  She has had some ongoing dysphagia to solids as outlined above.  Barium swallow done over the summer showed irregularity in the mid to distal esophagus, hopefully this is nothing to worry about given she had an EGD in 2018 which looked okay, but given this finding and her symptoms I do recommend an EGD to further evaluate.  Dysphagia could be due to dysmotility in the setting of moderate hiatal hernia but need to ensure lumen is okay in light of barium study.  We discussed if she wanted to proceed with EGD or not after  reviewing risks of EGD and anesthesia.  She feels well enough to proceed with that at this point, is seeing her cardiologist next week and will discuss with them further.  Her EF looks okay on last echo and clinically she is feeling better.  We otherwise discussed her longstanding PPI use and discussed risks of that.  She does have osteopenia at baseline but no recent fractures.  She understands her fracture risk is higher than usual, but given her longstanding reflux and moderate hiatal hernia, I think benefits of PPI outweigh risks given she is fairly symptomatic if she does not take it.  Fortunately symptoms are well controlled with low-dose PPI.  Otherwise, she should talk with her primary care about her history of thyroid nodules and if she warrants a surveillance ultrasound, they declined biopsy 6 years ago for this. They agreed.  Plan: - EGD with possible dilation to be done at the [facility] - continue PPI at present dose, discussed long term risks / benefits, benefits > risks currently - they will talk with PCP about follow up neck US for thyroid nodule and see cardiology in the interim as well.  - continue to chew food well and eat slowly to minimize symptoms until EGD can be done  Jolly Mango, MD [facility]

## 2021-06-05 NOTE — Patient Instructions (Addendum)
If you are age 84 or older, your body mass index should be between 23-30. Your Body mass index is 28.12 kg/m. If this is out of the aforementioned range listed, please consider follow up with your Primary Care Provider. ________________________________________________________  The [facility] providers would like to encourage you to use [facility] to communicate with providers for non-urgent requests or questions.  Due to long hold times on the telephone, sending your provider a message by [facility] may be a faster and more efficient way to get a response.  Please allow 48 business hours for a response.  Please remember that this is for non-urgent requests.  _______________________________________________________  Dennis Bast have been scheduled for an endoscopy on 07-12-21. Please follow written instructions given to you at your visit today. If you use inhalers (even only as needed), please bring them with you on the day of your procedure. We have added you to the wait list for a EGD in December.   Thank you for entrusting me with your care and for choosing [facility], Dr. [place] Cellar

## 2021-06-06 ENCOUNTER — Telehealth: Payer: Self-pay

## 2021-06-06 NOTE — Telephone Encounter (Signed)
Called and spoke to patient.  Dr. Havery [name] had a cancellation for this Friday, 12-2 and patient was on the wait list.  She would like to take the Friday appointment at 2:30 pm.  Advised patient to arrive at 1:30 pm and have clears only until 11:30 am.

## 2021-06-09 ENCOUNTER — Encounter: Payer: Self-pay | Admitting: Gastroenterology

## 2021-06-09 ENCOUNTER — Ambulatory Visit (AMBULATORY_SURGERY_CENTER): Payer: Medicare HMO | Admitting: Gastroenterology

## 2021-06-09 ENCOUNTER — Other Ambulatory Visit: Payer: Self-pay

## 2021-06-09 VITALS — BP 137/55 | HR 62 | Temp 97.1°F | Resp 15 | Ht 61.0 in | Wt 148.0 lb

## 2021-06-09 DIAGNOSIS — R1310 Dysphagia, unspecified: Secondary | ICD-10-CM

## 2021-06-09 DIAGNOSIS — R933 Abnormal findings on diagnostic imaging of other parts of digestive tract: Secondary | ICD-10-CM | POA: Diagnosis not present

## 2021-06-09 DIAGNOSIS — K449 Diaphragmatic hernia without obstruction or gangrene: Secondary | ICD-10-CM | POA: Diagnosis not present

## 2021-06-09 MED ORDER — SODIUM CHLORIDE 0.9 % IV SOLN: 500.0000 mL | INTRAVENOUS | Status: DC

## 2021-06-09 NOTE — Progress Notes (Signed)
History and Physical Interval Note: Patient seen on 06/05/21 - no interval changes since that visit. Here for EGD to further evaluate abnormal barium study and dysphagia. She understands risks and wishes to proceed. Further recommendations pending the results.  06/09/2021 2:35 PM  Churchill  has presented today for endoscopic procedure(s), with the diagnosis of  Encounter Diagnoses  Name Primary?   Dysphagia, unspecified type Yes   Abnormal barium swallow   .  The various methods of evaluation and treatment have been discussed with the patient and/or family. After consideration of risks, benefits and other options for treatment, the patient has consented to  the endoscopic procedure(s).   The patient's history has been reviewed, patient examined, no change in status, stable for surgery.  I have reviewed the patient's chart and labs.  Questions were answered to the patient's satisfaction.    Jolly Mango, MD [facility]

## 2021-06-09 NOTE — Patient Instructions (Signed)
YOU HAD AN ENDOSCOPIC PROCEDURE TODAY AT [facility]:   Refer to the procedure report that was given to you for any specific questions about what was found during the examination.  If the procedure report does not answer your questions, please call your gastroenterologist to clarify.  If you requested that your care partner not be given the details of your procedure findings, then the procedure report has been included in a sealed envelope for you to review at your convenience later.  YOU SHOULD EXPECT: Some feelings of bloating in the abdomen. Passage of more gas than usual.  Walking can help get rid of the air that was put into your GI tract during the procedure and reduce the bloating. If you had a lower endoscopy (such as a colonoscopy or flexible sigmoidoscopy) you may notice spotting of blood in your stool or on the toilet paper. If you underwent a bowel prep for your procedure, you may not have a normal bowel movement for a few days.  Please Note:  You might notice some irritation and congestion in your nose or some drainage.  This is from the oxygen used during your procedure.  There is no need for concern and it should clear up in a day or so.  SYMPTOMS TO REPORT IMMEDIATELY:  Following upper endoscopy (EGD)  Vomiting of blood or coffee ground material  New chest pain or pain under the shoulder blades  Painful or persistently difficult swallowing  New shortness of breath  Fever of 100F or higher  Black, tarry-looking stools  For urgent or emergent issues, a gastroenterologist can be reached at any hour by calling [phone]. Do not use MyChart messaging for urgent concerns.    DIET:  We do recommend a small meal at first, but then you may proceed to your regular diet.  Drink plenty of fluids but you should avoid alcoholic beverages for 24 hours.  ACTIVITY:  You should plan to take it easy for the rest of today and you should NOT DRIVE or use heavy machinery until  tomorrow (because of the sedation medicines used during the test).    FOLLOW UP: Our staff will call the number listed on your records 48-72 hours following your procedure to check on you and address any questions or concerns that you may have regarding the information given to you following your procedure. If we do not reach you, we will leave a message.  We will attempt to reach you two times.  During this call, we will ask if you have developed any symptoms of COVID 19. If you develop any symptoms (ie: fever, flu-like symptoms, shortness of breath, cough etc.) before then, please call [phone].  If you test positive for Covid 19 in the 2 weeks post procedure, please call and report this information to [place].    SIGNATURES/CONFIDENTIALITY: You and/or your care partner have signed paperwork which will be entered into your electronic medical record.  These signatures attest to the fact that that the information above on your After Visit Summary has been reviewed and is understood.  Full responsibility of the confidentiality of this discharge information lies with you and/or your care-partner.

## 2021-06-09 NOTE — Progress Notes (Signed)
A and O x3. Report to RN. Tolerated MAC anesthesia well.Teeth unchanged after procedure. 

## 2021-06-09 NOTE — Op Note (Signed)
[facility] Patient Name: Heather [name] Procedure Date: 06/09/2021 2:31 PM MRN: [id number] Endoscopist: Remo Lipps P. Heather [name] , MD Age: 86 Referring MD:  Date of Birth: 1934-07-15 Gender: Female Account #: [id number] Procedure:                Upper GI endoscopy Indications:              Dysphagia, Abnormal esophagram with contour                            irregularity Medicines:                Monitored Anesthesia Care Procedure:                Pre-Anesthesia Assessment:                           - Prior to the procedure, a History and Physical                            was performed, and patient medications and                            allergies were reviewed. The patient's tolerance of                            previous anesthesia was also reviewed. The risks                            and benefits of the procedure and the sedation                            options and risks were discussed with the patient.                            All questions were answered, and informed consent                            was obtained. Prior Anticoagulants: The patient has                            taken no previous anticoagulant or antiplatelet                            agents. ASA Grade Assessment: III - A patient with                            severe systemic disease. After reviewing the risks                            and benefits, the patient was deemed in                            satisfactory condition to undergo the procedure.  After obtaining informed consent, the endoscope was                            passed under direct vision. Throughout the                            procedure, the patient's blood pressure, pulse, and                            oxygen saturations were monitored continuously. The                            GIF [id number] #[id number] was introduced through the                            mouth, and advanced to the second part of  duodenum.                            The upper GI endoscopy was accomplished without                            difficulty. The patient tolerated the procedure                            well. Scope In: Scope Out: Findings:                 Esophagogastric landmarks were identified: the                            Z-line was found at 30 cm, the gastroesophageal                            junction was found at 30 cm and the upper extent of                            the gastric folds was found at 35 cm from the                            incisors.                           A 5 cm hiatal hernia was present.                           The exam of the esophagus was otherwise normal. No                            appreciable abnormality to account for barium                            swallow findings. No focal stenosis / stricture.                           A guidewire  was placed and the scope was withdrawn.                            Empiric dilation was performed in the entire                            esophagus with a Savary dilator with mild                            resistance at 17 mm. Relook endoscopy showed no                            mucosal wrents.                           The entire examined stomach was normal.                           The duodenal bulb and second portion of the                            duodenum were normal. Complications:            No immediate complications. Estimated blood loss:                            Minimal. Estimated Blood Loss:     Estimated blood loss was minimal. Impression:               - Esophagogastric landmarks identified.                           - 5 cm hiatal hernia.                           - Normal esophagus otherwise without concerning                            findings - empiric dilation performed to 60mm                           - Normal stomach.                           - Normal duodenal bulb and second portion of the                             duodenum. Recommendation:           - Patient has a contact number available for                            emergencies. The signs and symptoms of potential                            delayed complications were discussed with the  patient. Return to normal activities tomorrow.                            Written discharge instructions were provided to the                            patient.                           - Resume previous diet.                           - Continue present medications.                           - Await course post dilation. If swallowing                            difficulty persists despite the dilation please                            contact me for reassessment Heather [name]. Heather [name], MD 06/09/2021 2:54:23 PM This report has been signed electronically.

## 2021-06-09 NOTE — Progress Notes (Signed)
Called to room to assist during endoscopic procedure.  Patient ID and intended procedure confirmed with present staff. Received instructions for my participation in the procedure from the performing physician.  

## 2021-06-13 ENCOUNTER — Telehealth: Payer: Self-pay

## 2021-06-13 NOTE — Telephone Encounter (Signed)
  Follow up Call-  Call back number 06/09/2021  Post procedure Call Back phone  # [phone]  Permission to leave phone message Yes  Some recent data might be hidden     Patient questions:  Do you have a fever, pain , or abdominal swelling? No. Pain Score  0 *  Have you tolerated food without any problems? Yes.    Have you been able to return to your normal activities? Yes.    Do you have any questions about your discharge instructions: Diet   No. Medications  No. Follow up visit  No.  Do you have questions or concerns about your Care? No.  Actions: * If pain score is 4 or above: No action needed, pain <4. Have you developed a fever since your procedure? no  2.   Have you had an respiratory symptoms (SOB or cough) since your procedure? no  3.   Have you tested positive for COVID 19 since your procedure no  4.   Have you had any family members/close contacts diagnosed with the COVID 19 since your procedure?  no   If yes to any of these questions please route to Joylene John, RN and Joella Prince, RN

## 2021-06-14 ENCOUNTER — Encounter: Payer: Self-pay | Admitting: Cardiovascular Disease

## 2021-06-14 ENCOUNTER — Other Ambulatory Visit: Payer: Self-pay

## 2021-06-14 ENCOUNTER — Other Ambulatory Visit: Payer: Medicare HMO | Admitting: *Deleted

## 2021-06-14 ENCOUNTER — Ambulatory Visit (INDEPENDENT_AMBULATORY_CARE_PROVIDER_SITE_OTHER): Payer: Medicare HMO | Admitting: Cardiovascular Disease

## 2021-06-14 VITALS — BP 130/80 | HR 70 | Ht 61.0 in | Wt 148.0 lb

## 2021-06-14 DIAGNOSIS — I10 Essential (primary) hypertension: Secondary | ICD-10-CM | POA: Diagnosis not present

## 2021-06-14 DIAGNOSIS — I25119 Atherosclerotic heart disease of native coronary artery with unspecified angina pectoris: Secondary | ICD-10-CM

## 2021-06-14 DIAGNOSIS — I5032 Chronic diastolic (congestive) heart failure: Secondary | ICD-10-CM | POA: Diagnosis not present

## 2021-06-14 LAB — BASIC METABOLIC PANEL WITH GFR
BUN/Creatinine Ratio: 21 (ref 12–28)
BUN: 23 mg/dL (ref 8–27)
CO2: 24 mmol/L (ref 20–29)
Calcium: 9.5 mg/dL (ref 8.7–10.3)
Chloride: 102 mmol/L (ref 96–106)
Creatinine, Ser: 1.12 mg/dL — ABNORMAL HIGH (ref 0.57–1.00)
Glucose: 152 mg/dL — ABNORMAL HIGH (ref 70–99)
Potassium: 3.9 mmol/L (ref 3.5–5.2)
Sodium: 140 mmol/L (ref 134–144)
eGFR: 48 mL/min/1.73 — ABNORMAL LOW

## 2021-06-14 NOTE — Patient Instructions (Signed)
Medication Instructions:  Your physician recommends that you continue on your current medications as directed. Please refer to the Current Medication list given to you today.  *If you need a refill on your cardiac medications before your next appointment, please call your pharmacy*  Follow-Up: At [facility], you and your health needs are our priority.  As part of our continuing mission to provide you with exceptional heart care, we have created designated Provider Care Teams.  These Care Teams include your primary Cardiologist (physician) and Advanced Practice Providers (APPs -  Physician Assistants and Nurse Practitioners) who all work together to provide you with the care you need, when you need it.  Your next appointment:   4 month(s)  The format for your next appointment:   In Person  Provider:   [name], PA-C

## 2021-06-14 NOTE — Progress Notes (Signed)
Cardiology Office Note:    Date:  06/14/2021   ID:  Heather [name], DOB 11-06-1934, MRN [id number]  PCP:  Mosie Lukes, MD   [facility] Providers Cardiologist:  [name], MD Cardiology APP:  Sharmon Revere     Referring MD: Mosie Lukes, MD   Chief Complaint  Patient presents with   Coronary Artery Disease     History of Present Illness:    Heather [name] is a 86 y.o. female with a hx of: Coronary artery disease  Jaw pain=angingal equiv S/p Taxus DES to LCx 9/06 S/p Taxus DES to RCA 12/06 Residual dz 12/06: mLAD 70-80 Aortic atherosclerosis  Borderline diabetes mellitus  GERD  Hypertension  Hyperlipidemia  Hx of CVA Hx of post op DVT (after TKR) in 2016; Rivaroxaban x 6 mos Fatigue, palpitations 3/22 Echocardiogram 2/22: EF 55-60 Myoview 2/22: EF 67, low risk  Event monitor 3/22: no sig arrhythmia  Rx w/ beta-blocker  Small bowel obstruction October 2022, treated surgically.  The patient is here with her daughter today. She has been hospitalized since I last saw her, requiring hernia repair to treat small bowel obstruction on 04/17/21.  The patient has had occasional jaw pain but no crescendo pattern.  Symptoms have been episodic and not related to any specific activity.  No shortness of breath.  Her tremor has improved since I last saw her.  She has had a difficult year with multiple hospitalizations, but seems to be doing much better since her last surgery when she had a small bowel obstruction.  Her diet is back to normal.  She is only taking furosemide as needed and has not needed to use it often.  Past Medical History:  Diagnosis Date   Chronic stable angina [facility])    Coronary artery disease cardiologist--- dr Burt Knack   hx NSTEMI  w/ cardiac cath 03-16-2005 PCI with DES to LCx;   cardiac cath 06-27-2005  PCI with DES to RCA with residual dx LAD manage medcially;  lexiscan 01-26-20211 normal no ischemia, ef 70%;  stress echo w/ dobutamine  10-24-2011 negative ishcmeia , normal ef // Myoview 2/22: EF 67, no ischemia or infarction, no TID, low risk    Diabetes mellitus type 2, diet-controlled ([place])    followed by pcp  (10-19-2020  pt stated checks daily in am,  fasting blood surgar--- 115--120s)   DOE (dyspnea on exertion)    per pt "when I over do",  ok with household chores   Echocardiogram 08/2020    Echocardiogram 2/22: EF 55-60, no RWMA, mild LVH, Gr 2 DD, GLS-21.7%, normal RVSF, trivial MR, RVSP 39.5   Edema of right lower extremity    GERD (gastroesophageal reflux disease)    Hiatal hernia    recurrence,  hx HH repair 1989   History of cervical cancer    s/p  vaginal hysterectomy   History of DVT of lower extremity 2016   11-29-2014 post op right TKA of right lower extremity and completed xarelto    History of esophageal stricture    hx s/p dilatation's   History of gastric ulcer 2005 approx.   History of palpitations 2010   event monitor 07-07-2009 showed NSR w/ freq. SVT ectopies with short runs, rare PVCs   History of TIA (transient ischemic attack) 06/1999   12-15-2019  per pt had several TIA between 12/ 2000 to 02/ 2001 , was sent to specialist _0 , had test that was normal (10-19-2020 pt stated no TIAs  since ) but has residual of essential tremors of right arm/ hand   Hypertension    Intermittent palpitations    IT band syndrome    Migraine    "ice pick headche lasts about 30 seconds"   Mixed hyperlipidemia    Mixed incontinence urge and stress    Multiple thyroid nodules    followed by pcp---   ultrasound 11-22-2014 no bx   (12-15-2019 per pt had a endocrinologist and was told did not need bx)   OA (osteoarthritis)    knees, elbow, hip, ankles   Occasional tremors    right arm/ hand  s/p TIA residual 2000   Osteoporosis    taking vitamin d   Peroneal DVT (deep venous thrombosis) ([place]) 12/22/2014   Right bundle branch block (RBBB) with left anterior fascicular block (LAFB)    RLS (restless legs  syndrome)    S/P drug eluting coronary stent placement 2006   03-16-2005  PCI x1 DES to LCx;   06-27-2005  PCI x1 DES to RCA   Urinary retention    post op sling prodecure on 10-27-2020, has foley cathether    Past Surgical History:  Procedure Laterality Date   ANTERIOR AND POSTERIOR REPAIR N/A 12/22/2019   Procedure: ANTERIOR (CYSTOCELE)  REPAIR;  Surgeon: Janyth Contes, MD;  Location: [place];  Service: Gynecology;  Laterality: N/A;   ANTERIOR AND POSTERIOR REPAIR WITH SACROSPINOUS FIXATION N/A 10/27/2020   Procedure: SACROSPINOUS LIGAMENT FIXATION;  Surgeon: Jaquita Folds, MD;  Location: [facility];  Service: Gynecology;  Laterality: N/A;   BLADDER SUSPENSION N/A 10/27/2020   Procedure: TRANSVAGINAL TAPE (TVT) PROCEDURE;  Surgeon: Jaquita Folds, MD;  Location: [facility];  Service: Gynecology;  Laterality: N/A;   CATARACT EXTRACTION W/ INTRAOCULAR LENS  IMPLANT, BILATERAL  2015   CHOLECYSTECTOMY N/A 01/25/2021   Procedure: LAPAROSCOPIC CHOLECYSTECTOMY WITH INTRAOPERATIVE CHOLANGIOGRAM AND LYSIS OF ADHESIONS;  Surgeon: Jesyka Slaght Boston, MD;  Location: [facility];  Service: General;  Laterality: N/A;   COLONOSCOPY  last one ?   CORONARY ANGIOPLASTY WITH STENT PLACEMENT  03-16-2005   dr Lia Foyer   PCI and DES x1 to LCx   CORONARY ANGIOPLASTY WITH STENT PLACEMENT  06-27-2005  dr Lia Foyer   PCI and DES x1 to RCA with residual disease LAD 70-80% to manage medically   CYSTOSCOPY N/A 10/27/2020   Procedure: CYSTOSCOPY;  Surgeon: Jaquita Folds, MD;  Location: [facility];  Service: Gynecology;  Laterality: N/A;   CYSTOSCOPY N/A 11/09/2020   Procedure: CYSTOSCOPY;  Surgeon: Jaquita Folds, MD;  [facility];  Service: Gynecology;  Laterality: N/A;   FOOT SURGERY Left 1990s   left foot stress fracture repair, per pt no hardware   HIATAL HERNIA REPAIR  1989   INCISIONAL HERNIA REPAIR  N/A 01/25/2021   Procedure: PRIMARY REPAIR OF INCISIONAL HERNIA;  Surgeon: [name] Boston, MD;  Location: [facility];  Service: General;  Laterality: N/A;   INGUINAL HERNIA REPAIR Left 04/17/2021   Procedure: LAPAROSCOPIC LEFT INGUINAL HERNIA REPAIR WITH MESH;  Surgeon: Ralene Ok, MD;  Location: [facility];  Service: General;  Laterality: Left;   KNEE ARTHROSCOPY Bilateral right ?/   left x2 , last one 09-12-2009 @ [facility]   PUBOVAGINAL SLING N/A 11/09/2020   Procedure: REVISION OF PUBO-VAGINAL SLING;  Surgeon: Jaquita Folds, MD;  Location: [facility];  Service: Gynecology;  Laterality: N/A;   RECTOCELE REPAIR N/A 04/20/2020   Procedure:  POSTERIOR REPAIR (RECTOCELE);  Surgeon: Janyth Contes, MD;  Location: [facility];  Service: Gynecology;  Laterality: N/A;   RECTOCELE REPAIR N/A 10/27/2020   Procedure: POSTERIOR REPAIR (RECTOCELE);  Surgeon: Jaquita Folds, MD;  Location: [facility];  Service: Gynecology;  Laterality: N/A;  total time requested for all procedures is 2 hours   TOTAL KNEE ARTHROPLASTY  11/12/2011   Procedure: TOTAL KNEE ARTHROPLASTY;  Surgeon: Lorn Junes, MD;  Location: [place];  Service: Orthopedics;  Laterality: Left;  Dr Noemi Chapel wants 90 minutes for this case   TOTAL KNEE ARTHROPLASTY Right 11/29/2014   Procedure: RIGHT TOTAL KNEE ARTHROPLASTY;  Surgeon: Vickey Huger, MD;  Location: [place];  Service: Orthopedics;  Laterality: Right;   UPPER GASTROINTESTINAL ENDOSCOPY  last one 04-25-2017   with dilatation esophageal stricture and savary dilatation   VAGINAL HYSTERECTOMY  1988    no ovaries removed for bleeeding    Current Medications: Current Meds  Medication Sig   acetaminophen (TYLENOL) 500 MG tablet Take 1,000 mg by mouth every 6 (six) hours as needed for moderate pain.   amLODipine (NORVASC) 10 MG tablet TAKE 1 TABLET EVERY DAY (Patient taking differently: Take 10 mg by mouth daily.)   Ascorbic Acid (VITAMIN  C) 500 MG CHEW Chew 500 mg by mouth daily.   aspirin EC 81 MG tablet Take 1 tablet (81 mg total) by mouth daily.   Blood Glucose Monitoring Suppl (TRUE METRIX AIR GLUCOSE METER) w/Device KIT USE TO CHECK BLOOD SUGAR ONCE DAILY AND AS NEEDED.  DX CODE E11.9   cephALEXin (KEFLEX) 250 MG capsule Take 1 capsule (250 mg total) by mouth daily.   Cholecalciferol (VITAMIN D) 50 MCG (2000 UT) CAPS Take 2,000 Units by mouth daily.    diclofenac Sodium (VOLTAREN) 1 % GEL Apply 1 application topically 4 (four) times daily as needed (hip pain).   dicyclomine (BENTYL) 20 MG tablet Take 1 tablet (20 mg total) by mouth 2 (two) times daily as needed for spasms (abdominal cramping).   famotidine (PEPCID) 20 MG tablet Take 20 mg by mouth at bedtime as needed for heartburn or indigestion.   furosemide (LASIX) 20 MG tablet Take 1 tablet (20 mg total) by mouth daily. (Patient taking differently: Take 20 mg by mouth daily as needed for fluid.)   glucose blood (TRUE METRIX BLOOD GLUCOSE TEST) test strip USE TO CHECK BLOOD SUGAR ONCE A DAY OR AS NEEDED.  DX CODE: E11.9   isosorbide mononitrate (IMDUR) 60 MG 24 hr tablet Take 1.5 tablets (90 mg total) by mouth daily.   losartan (COZAAR) 100 MG tablet Take 0.5 tablets (50 mg total) by mouth 2 (two) times daily.   magnesium oxide (MAG-OX) 400 MG tablet Take 400 mg by mouth at bedtime.   metoprolol succinate (TOPROL-XL) 25 MG 24 hr tablet Take 0.5 tablets (12.5 mg total) by mouth daily.   nitroGLYCERIN (NITROSTAT) 0.4 MG SL tablet Place 1 tablet (0.4 mg total) under the tongue every 5 (five) minutes as needed for chest pain.   omeprazole (PRILOSEC) 20 MG capsule TAKE 1 CAPSULE EVERY DAY AS NEEDED (NEED OFFICE VISIT BEFORE ANY FURTHER REFILLS)   ondansetron (ZOFRAN-ODT) 4 MG disintegrating tablet Take 4 mg by mouth every 8 (eight) hours as needed for nausea or vomiting.   polyethylene glycol (MIRALAX / GLYCOLAX) 17 g packet Take 17 g by mouth daily as needed for mild  constipation.   pramipexole (MIRAPEX) 0.25 MG tablet TAKE 1 TABLET TWICE DAILY (Patient  taking differently: Take 0.25 mg by mouth 2 (two) times daily.)   simvastatin (ZOCOR) 20 MG tablet TAKE 1 TABLET AT BEDTIME (Patient taking differently: Take 20 mg by mouth daily at 6 PM.)   traMADol (ULTRAM) 50 MG tablet Take 1 tablet (50 mg total) by mouth every 6 (six) hours as needed for severe pain.   TRUEplus Lancets 33G MISC USE TO CHECK BLOOD SUGAR ONCE DAILY AND AS NEEDED.  DX CODE: E11.9   Zinc 50 MG TABS Take 50 mg by mouth daily.     Allergies:   Baclofen and Nitrofurantoin   Social History   Socioeconomic History   Marital status: Widowed    Spouse name: Not on file   Number of children: 3   Years of education: 7   Highest education level: Not on file  Occupational History   Occupation: works partime in Office manager: RETIRED    Comment: retired  Tobacco Use   Smoking status: Never   Smokeless tobacco: Never  Vaping Use   Vaping Use: Never used  Substance and Sexual Activity   Alcohol use: No    Alcohol/week: 0.0 standard drinks   Drug use: Never   Sexual activity: Not Currently    Birth control/protection: Surgical    Comment: lives alone  Other Topics Concern   Not on file  Social History Narrative   Lives with husband, Caffeine use- Half Caffeine)- 2 cups daily.  3 children living, one passed away.   Education: HS. Business college.  Retired.    Social Determinants of Health   Financial Resource Strain: Low Risk    Difficulty of Paying Living Expenses: Not hard at all  Food Insecurity: No Food Insecurity   Worried About Charity fundraiser in the Last Year: Never true   [place] in the Last Year: Never true  Transportation Needs: No Transportation Needs   Lack of Transportation (Medical): No   Lack of Transportation (Non-Medical): No  Physical Activity: Inactive   Days of Exercise per Week: 0 days   Minutes of Exercise per Session: 0 min  Stress: Not  on file  Social Connections: Not on file     Family History: The patient's family history includes Arthritis in an other family member; Cancer in her brother; Coronary artery disease in her mother; Depression in her brother; Diabetes in her brother and daughter; Heart disease in her mother; Hypertension in her daughter and another family member; Other in an other family member; Stroke in her brother and father; Thyroid disease in her daughter. There is no history of Breast cancer, Colon cancer, Anesthesia problems, Hypotension, Malignant hyperthermia, Pseudochol deficiency, Colon polyps, Esophageal cancer, Rectal cancer, or Stomach cancer.  ROS:   Please see the history of present illness.    Positive for fatigue, restless legs.  All other systems reviewed and are negative.  EKGs/Labs/Other Studies Reviewed:    The following studies were reviewed today: Echo 01/29/21: IMPRESSIONS     1. Left ventricular ejection fraction, by estimation, is 60 to 65%. The  left ventricle has normal function. The left ventricle has no regional  wall motion abnormalities. Left ventricular diastolic parameters are  indeterminate.   2. Right ventricular systolic function is normal. The right ventricular  size is normal. There is normal pulmonary artery systolic pressure. The  estimated right ventricular systolic pressure is 74.9 mmHg.   3. The mitral valve is normal in structure. Trivial mitral valve  regurgitation. No evidence  of mitral stenosis.   4. The aortic valve is tricuspid. Aortic valve regurgitation is not  visualized. Mild to moderate aortic valve sclerosis/calcification is  present, without any evidence of aortic stenosis.   5. The inferior vena cava is normal in size with greater than 50%  respiratory variability, suggesting right atrial pressure of 3 mmHg.  Recent Labs: 08/19/2020: NT-Pro BNP 428 01/31/2021: TSH 2.166 02/01/2021: Magnesium 2.0 02/08/2021: B Natriuretic Peptide  428.0 04/26/2021: ALT 16; BUN 16; Creatinine, Ser 0.79; Hemoglobin 12.1; Platelets 206.0; Potassium 4.4; Sodium 138  Recent Lipid Panel    Component Value Date/Time   CHOL 150 07/14/2020 0921   TRIG 117.0 07/14/2020 0921   HDL 62.10 07/14/2020 0921   CHOLHDL 2 07/14/2020 0921   VLDL 23.4 07/14/2020 0921   LDLCALC 65 07/14/2020 0921   LDLDIRECT 73.4 05/20/2014 1651     Risk Assessment/Calculations:           Physical Exam:    VS:  BP 130/80 Comment: BP recheck at 9:02 am 126/68  Pulse 70   Ht _0  (1.549 m)   Wt 148 lb (67.1 kg)   LMP  (LMP Unknown)   SpO2 97%   BMI 27.96 kg/m     Wt Readings from Last 3 Encounters:  06/14/21 148 lb (67.1 kg)  06/09/21 148 lb (67.1 kg)  06/05/21 148 lb 12.8 oz (67.5 kg)     GEN:  Well nourished, well developed in no acute distress HEENT: Normal NECK: No JVD; No carotid bruits LYMPHATICS: No lymphadenopathy CARDIAC: RRR, 2/6 ejection murmur at the right upper sternal border RESPIRATORY:  Clear to auscultation without rales, wheezing or rhonchi  ABDOMEN: Soft, non-tender, non-distended MUSCULOSKELETAL:  No edema; No deformity  SKIN: Warm and dry NEUROLOGIC:  Alert and oriented x 3 PSYCHIATRIC:  Normal affect   ASSESSMENT:    1. Essential hypertension   2. Coronary artery disease involving native coronary artery of native heart with angina pectoris ([place])   3. Chronic diastolic heart failure (HCC)    PLAN:    In order of problems listed above:  The patient is on multidrug antihypertensive therapy with amlodipine, metoprolol succinate, and losartan.  She will send in some of her home blood pressure readings.  She really wants to reduce her medication if possible, but I think her blood pressure control is probably in a good range right now.  I will review her readings when she sends them in.  She had labs drawn today for follow-up evaluation. Overall appears stable.  Continue aspirin, isosorbide, amlodipine, and metoprolol  succinate at low-dose. Appears euvolemic on exam.  I reviewed her most recent echocardiogram.  We will continue current therapies.           Medication Adjustments/Labs and Tests Ordered: Current medicines are reviewed at length with the patient today.  Concerns regarding medicines are outlined above.  No orders of the defined types were placed in this encounter.  No orders of the defined types were placed in this encounter.   Patient Instructions  Medication Instructions:  Your physician recommends that you continue on your current medications as directed. Please refer to the Current Medication list given to you today.  *If you need a refill on your cardiac medications before your next appointment, please call your pharmacy*  Follow-Up: At [facility], you and your health needs are our priority.  As part of our continuing mission to provide you with exceptional heart care, we have created designated Provider Care Teams.  These Care Teams include your primary Cardiologist (physician) and Advanced Practice Providers (APPs -  Physician Assistants and Nurse Practitioners) who all work together to provide you with the care you need, when you need it.  Your next appointment:   4 month(s)  The format for your next appointment:   In Person  Provider:   [name], PA-C          Signed, [name], MD  06/14/2021 1:29 PM    [place]

## 2021-06-15 ENCOUNTER — Ambulatory Visit: Payer: Medicare HMO | Admitting: Pharmacist

## 2021-06-15 ENCOUNTER — Telehealth: Payer: Self-pay | Admitting: Family Medicine

## 2021-06-15 ENCOUNTER — Other Ambulatory Visit: Payer: Medicare HMO

## 2021-06-15 ENCOUNTER — Other Ambulatory Visit: Payer: Self-pay | Admitting: Family Medicine

## 2021-06-15 DIAGNOSIS — I5032 Chronic diastolic (congestive) heart failure: Secondary | ICD-10-CM

## 2021-06-15 DIAGNOSIS — E042 Nontoxic multinodular goiter: Secondary | ICD-10-CM

## 2021-06-15 DIAGNOSIS — K449 Diaphragmatic hernia without obstruction or gangrene: Secondary | ICD-10-CM

## 2021-06-15 DIAGNOSIS — I10 Essential (primary) hypertension: Secondary | ICD-10-CM

## 2021-06-15 DIAGNOSIS — I2510 Atherosclerotic heart disease of native coronary artery without angina pectoris: Secondary | ICD-10-CM

## 2021-06-15 DIAGNOSIS — E785 Hyperlipidemia, unspecified: Secondary | ICD-10-CM

## 2021-06-15 DIAGNOSIS — Z87440 Personal history of urinary (tract) infections: Secondary | ICD-10-CM

## 2021-06-15 DIAGNOSIS — E1169 Type 2 diabetes mellitus with other specified complication: Secondary | ICD-10-CM

## 2021-06-15 DIAGNOSIS — E1121 Type 2 diabetes mellitus with diabetic nephropathy: Secondary | ICD-10-CM

## 2021-06-15 DIAGNOSIS — K219 Gastro-esophageal reflux disease without esophagitis: Secondary | ICD-10-CM

## 2021-06-15 DIAGNOSIS — R1310 Dysphagia, unspecified: Secondary | ICD-10-CM

## 2021-06-15 NOTE — Telephone Encounter (Signed)
-----   Message from [name], [place] sent at 06/15/2021 12:35 PM EST ----- Regarding: appointment Patient in need of follow appointment in 2023 with PCP, Dr [name].  Also GI notes mentioned patient should discuss getting repeat US of thyroid with PCP due to history of thyroid nodule and dysphagia. Dr [name] do you want to wait to order when you see her?  Tammy

## 2021-06-15 NOTE — Patient Instructions (Signed)
Heather [name],  It was a pleasure speaking with you today.  I have send message to scheduler about making a follow up appointment with Dr [name] in 2023. Please call me if you have not heard from our office about this appointment by June 29, 2021. I have attached a summary of our visit today and information about your health goals.   Our next appointment is by telephone on September 13, 2021 at 11am  Please call the care guide team at [phone] if you need to cancel or reschedule your appointment.   If you have any questions or concerns, please feel free to contact me either at the phone number below or with a MyChart message.   Keep up the good work!  [name], PharmD Clinical Pharmacist [facility] [phone] (direct line)  [phone] (main office number)  CARE PLAN ENTRY Updated 06/15/2021  Current Barriers:  Chronic Disease Management support, education, and care coordination needs related to Hypertension, Hyperlipidemia/CAD, Diabetes, GERD, RLS   Hypertension / heart failure:  BP Readings from Last 3 Encounters:  06/14/21 130/80  06/09/21 (!) 137/55  06/05/21 (!) 146/66   Pharmacist Clinical Goal(s): Over the next 90 days, patient will work with PharmD and providers to achieve BP goal <140/90 Current regimen:  Amlodipine 10mg  daily  Losartan 50mg  daily Furosemide 20mg  daily if needed for swelling / weight gain of more than 3 pounds in 1 day or 5 pounds in 1 week. Interventions: Requested patient to check blood pressure 1 to 2 times per week and record Discussed blood pressure goal Patient self care activities - Over the next 90 days, patient will: Check blood pressure 1 to 2 times per week, document, and provide at future appointments Ensure daily salt intake < 2300 mg/day Continue current regimen for blood pressure and heart Recheck potassium in 3 months  Hyperlipidemia/CAD Lab Results  Component Value Date/Time   LDLCALC  65 07/14/2020 09:21 AM   LDLDIRECT 73.4 05/20/2014 04:51 PM   Pharmacist Clinical Goal(s): Over the next 90 days, patient will work with PharmD and providers to maintain LDL goal < 70 Current regimen:  Simvastatin 20mg  daily at bedtime Aspirin 81mg  once daily Interventions: Discussed LDL goal Patient self care activities - Over the next 90 days, patient will: Maintain cholesterol medication regimen.   Diabetes Lab Results  Component Value Date/Time   HGBA1C 6.4 (H) 04/16/2021 05:00 AM   HGBA1C 7.9 (H) 01/17/2021 11:34 AM   Pharmacist Clinical Goal(s): Over the next 90 days, patient will work with PharmD and providers to maintain A1c goal <7.5% Current regimen:  Diet and exercise management   Interventions: Discussed diet and exercise; recommended continued avoidance of sugary beverages; limit serving sizes of high carbohydrate foods (bread, potatoes, rice and pasta)  Reviewed home blood glucose readings and reviewed goals  Fasting blood glucose goal (before meals) = 80 to 130 Blood glucose goal after a meal = less than 180  Patient self care activities - Over the next 90 days, patient will: Limit intake of food that can increase blood glucose Continue to check blood glucose daily, varying between morning and afternoon / night.   Frequent urinary tract infections / Overactive Bladder Current regimen: cephalexin 250mg  daily (for 6 months) Interventions:  Continue current therapy and follow up with Dr Wannetta Sender   Acid Reflux / GERD:  Pharmacist Clinical Goal(s): Over the next 90 days, patient will work with PharmD and providers to decrease acid reflux symptoms Current regimen:  Famotidine 20mg  once a day at night if needed Omeprazole 20mg  once a day   Interventions: Discussed medications used to control acid reflux Goal is to use lowest dose needed to control symptoms Patient self care activities - Over the next 90 days, patient will: Continue current regimen    Health  Maintenance:  Reviewed vaccination history; updated history to reflect that patient has COVID bivalent booster 05/03/2021 at [facility] Reminded patient to get 2nd Shingrix vaccine at beginning of 2023 (1st was 05/03/2021) Forwarded message to PCP scheduler to make f/u appt with PCP in 2023.   Medication management Pharmacist Clinical Goal(s): Over the next 90 days, patient will work with PharmD and providers to maintain optimal medication adherence Current pharmacy: [facility] Order Interventions Comprehensive medication review performed. Continue current medication management strategy Patient self care activities - Over the next 90 days, patient will: Focus on medication adherence by filling and taking medications appropriately  Take medications as prescribed Report any questions or concerns to PharmD and/or provider(s)  Patient verbalizes understanding of instructions provided today and agrees to view in [place].

## 2021-06-15 NOTE — Chronic Care Management (AMB) (Signed)
Chronic Care Management Pharmacy Note  06/15/2021 Name:  Heather [name] MRN:  [id number] DOB:  1935/01/15  Summary: Patient hs EGD 06/09/2021 - normal; esophagus stretched. Patient has initially seen GI for dysphagia. GI notes mentioned patient should discuss rechecking thyroid US due to history of thyroid nodule.    Recommendations/Changes made from today's visit: Updated medication list Message sent to Heather [name] to updated immunization for COVID booster.    Subjective: Heather [name] is an 86 y.o. year old female who is Heather primary patient of Heather Lukes, [name].  The CCM team was consulted for assistance with disease management and care coordination needs.    Engaged with patient by telephone for follow up visit in response to provider referral for pharmacy case management and/or care coordination services.   Consent to Services:  The patient was given information about Chronic Care Management services, agreed to services, and gave verbal consent prior to initiation of services.  Please see initial visit note for detailed documentation.   Patient Care Team: Heather Lukes, [name] as PCP - General (Family Medicine) Heather Mocha, [name] as PCP - Cardiology (Cardiology) Heather [name], Heather [name] (Optometry) Heather Contes, [name] as Consulting Physician (Obstetrics and Gynecology) Heather [name], DPM as Consulting Physician (Podiatry) Heather [name] as Physician Assistant (Cardiology) Heather [name], Heather [name] (Pharmacist) Heather Boston, [name] as Consulting Physician (General Surgery) Heather Folds, [name] as Consulting Physician (Gynecology) Heather [name] as Consulting Physician (Gastroenterology)  Recent office visits: 04/26/2021 - Heather [name] Med ([place]) Hospital f/u for small bowel obstruction. Checked CBC and BMP and lipase. 02/16/2021 - PCP (Heather Heather [name]) Video Visit for hospital follow up. C/o nausea and vomiting with dysphagia. Check for UTI. Started  cefdinir 348m bid. Referral to GI for dysphagia.  01/16/2021 - PCP ([place]  Bruising. Labs checked - CBC, CMP, A1c. No med changes.    Recent consult visits: 06/14/2021 - Heather (Heather CBurt [name] blood pressure was 130/80; No med changes.  06/08/2021 - EGD completed.  06/05/2021 - GI (Heather [name] dysphagia. Ordered EGD; Continue PPI therapy. Recommended discuss f/u neck ultrasound due to past history of thyroid nodule 05/15/2021 - Heather Phone Call (see documentaiton on 05/12/21 call) Patient requested refill on potasisum supplement. Clinical pharmacist stopped potassium supplment since poitassium was 4.4 on 04/26/21 and 5 on 04/16/2021 05/12/2021 - Heather Phone call with clinical pharmacist MBonneau [name] was in 40's so was instructed to lower dose of metoprolol form 258mdialy to 12.48m57maily 05/08/2021 - Heather Phone Call with clinical pharmacist. F/U BP. Unable to reach patient. 05/03/2021 - UGenevive BidJulien GirtN) PTNS #10 05/01/2021 - Heather (Heather [name], RPH) F/U HTN. BP was 158/72. Noted 2 episodes of hypotension that occurred around 11am. High home reading mostly around 7:30am. Changed amlodipine to take 49m48m the EVENING.  04/28/2021 - GYN (Heather SchrWannetta SenderU OAB. PTNS session #9 04/17/21 Surgery-incarcerated left inguinal hernia 04/12/21 Uro GYN Heather [name]- pt was seen for Overactive Bladder. The PTNS stimulation was then performed The needle was removed and hemostasis was noted The pt will return in 1 week for PTNS session # 8. And Pt was Advised to reduce intake of decaf coffee, tea or soda in the evening. 04/10/21 Cardiology Heather [name]- Seen for Hypertension. Labs were ordered and pt was started on Metoprolol Succinate 25 mg daily and Losartan Potassium was changed to 50 mg BID.  03/30/21 Sports Medicine Sch[place]ord [name] pt was seen for Greater trochanteric pain syndrome of  right lower extremity. Labs were ordered and pt received methylPREDNISolone Acetate 40 mg steroid injection.  follow up in 4 weeks 03/22/21 Heather Gaw [name]- Seen for overactive bladder. No labs or med changes. Return in 1 week for PTNS session # 5. 03/21/21 Heather [name] - Seen for Essential Hypertension. Pt increased Losartan Potassium to 100 mg and advised to Monitor your blood pressure and heart rate at home. Follow up in 3 weeks for lab work and blood pressure check. 03/15/21 Heather [name]- Seen for Recurrent UTI. Pt started Cephalexin 250 mg daily and discontinued Cefdinir and Mirabegron. Follow up in 1 week  02/23/2021 - Heather (Heather Heather [name]) F/U CHF / SOB / HTN. Restarted losartan at lower dose of 64m daily (previously took 1041mdaily)  02/03/2021 - Heather Surgery (Heather Heather LoftyF/U surgery. Prescribed Bactrim DS bid for 7 days; hydrocodone/APAP 5/32546m6h as needed for pain up to 5 days and ondansetron 4mg61mh as needed for nausea.  01/23/2021 - Heather (Heather SchrWannetta SenderU post surgery  sling release and OAB. Started Myrbetriq 25mg12mly   Hospital visits: 04/15/2021 to 04/18/20[facility].  New?Medications Started at Hospital Discharge:??started polyethylene glycol (MIRALAX / GLYCOLAX) and traMADol (ULTRVeatrice Bourbonication Changes at Hospital Discharge: none Medications Discontinued at Hospital Discharge: none 04/14/21 to 04/15/2021 [facility]. Discharge date was 04/15/21. Discharged from  [facility]started dicyclomine 20 MG tablet Medication Changes at Hospital Discharge: Changed ondansetron 4 MG disintegrating tablet 02/04/2021 to [facility] New Medications: Augmentin 875mg 68me Heather day for 3 days (total of 5 days) and oxycodone IR 5mg up19m every 6 hours as needed for pain.  Medications stopped: metoprolol 50mg, c38mnir 300mg, hy68modone/ APAP and Bactrim DS.   01/28/2021 to [facility] [facility] - Acute on chronic CHF.  Medications started: furosemide 20mg dail2m needed for weight gain greater than 3 pounds in 1 day or 5 pounds in 1 week; Cefdinir 300mg bid f23m days. Medications stopped: spironolactone and losartan 01/25/2021 General Surgery (Heather. Gross)  ChoJohney Mainetectomy; repair of incarcerated incisionaly hernias.         Objective:  Lab Results  Component Value Date   CREATININE 1.12 (H) 06/14/2021   CREATININE 0.79 04/26/2021   CREATININE 0.73 04/16/2021    Lab Results  Component Value Date   HGBA1C 6.4 (H) 04/16/2021   Last diabetic Eye exam:  Lab Results  Component Value Date/Time   HMDIABEYEEXA No Retinopathy 08/08/2020 12:00 AM    Last diabetic Foot exam: No results found for: HMDIABFOOTEX      Component Value Date/Time   CHOL 150 07/14/2020 0921   TRIG 117.0 07/14/2020 0921   HDL 62.10 07/14/2020 0921   CHOLHDL 2 07/14/2020 0921   VLDL 23.4 07/14/2020 0921   LDLCALC 65 07/14/2020 0921   LDLDIRECT 73.4 05/20/2014 1651    Hepatic Function Latest Ref Rng & Units 04/26/2021 04/16/2021 04/15/2021  Total Protein 6.0 - 8.3 g/dL 6.1 5.7(L) 7.2  Albumin 3.5 - 5.2 g/dL 4.1 3.4(L) 4.5  AST 0 - 37 U/L _0 ALT 0 - 35 U/L _1 Alk Phosphatase 39 - 117 U/L 95 79 108  Total Bilirubin 0.2 - 1.2 mg/dL 0.5 1.0 0.6  Bilirubin, Direct 0.0 - 0.3 mg/dL - - -  Lab Results  Component Value Date/Time   TSH 2.166 01/31/2021 05:01 AM   TSH 2.07 07/14/2020 09:21 AM   TSH 3.17 10/13/2019 09:09 AM    CBC Latest Ref Rng & Units 04/26/2021 04/18/2021 04/16/2021  WBC 4.0 - 10.5 K/uL 6.3 6.6 7.4  Hemoglobin 12.0 - 15.0 g/dL 12.1 13.4 13.0  Hematocrit 36.0 - 46.0 % 36.5 40.9 40.7  Platelets 150.0 - 400.0 K/uL 206.0 146(L) 152    Lab Results  Component Value Date/Time   VD25OH 43.74 02/16/2021 02:03 PM    Clinical ASCVD: Yes  The ASCVD Risk score (Arnett DK, et  al., 2019) failed to calculate for the following reasons:   The 2019 ASCVD risk score is only valid for ages 72 to 82    Other:  DEXA 01/16/2021 DualFemur Neck Right 01/16/2021 Osteopenia -1.6  DualFemur Neck Right 06/02/2018 Osteopenia -1.6    DualFemur Total Mean 01/16/2021 Normal -0.9  DualFemur Total Mean 06/02/2018 Normal -0.5    Left Forearm Radius 33% 01/16/2021 Normal -1.0  Left Forearm Radius 33% 06/02/2018 Normal -0.6    Major Osteoporotic Fracture: 13.8% Hip Fracture: 3.8%  EF = 60-65% (2022)  Social History   Tobacco Use  Smoking Status Never  Smokeless Tobacco Never   BP Readings from Last 3 Encounters:  06/14/21 130/80  06/09/21 (!) 137/55  06/05/21 (!) 146/66   Pulse Readings from Last 3 Encounters:  06/14/21 70  06/09/21 62  06/05/21 80   Wt Readings from Last 3 Encounters:  06/14/21 148 lb (67.1 kg)  06/09/21 148 lb (67.1 kg)  06/05/21 148 lb 12.8 oz (67.5 kg)    Assessment: Review of patient past medical history, allergies, medications, health status, including review of consultants reports, laboratory and other test data, was performed as part of comprehensive evaluation and provision of chronic care management services.   SDOH:  (Social Determinants of Health) assessments and interventions performed:  SDOH Interventions    Flowsheet Row Most Recent Value  SDOH Interventions   Financial Strain Interventions Intervention Not Indicated  Physical Activity Interventions Other (Comments)  [discussed increasing exericse as able - goal of 150 minutes per week.]  Social Connections Interventions Other (Comment)  [Discussed increasing outings and social interactions like attending spiritual events. She does attend chruch on line.]        CCM Care Plan  Allergies  Allergen Reactions   Baclofen Other (See Comments)    Hyperactivity    Nitrofurantoin Rash    Medications Reviewed Today     Reviewed by Heather [name], Heather [name] (Pharmacist) on  06/15/21 at 1207  Med List Status: <None>   Medication Order Taking? Sig Documenting Provider Last Dose Status Informant  acetaminophen (TYLENOL) 500 MG tablet [id number] Yes Take 1,000 mg by mouth every 6 (six) hours as needed for moderate pain. Provider, Historical, [name] Taking Active Self  amLODipine (NORVASC) 10 MG tablet [id number] Yes TAKE 1 TABLET EVERY DAY Heather Lukes, [name] Taking Active            Med Note Vita Barley Apr 17, 2021  7:52 AM)    Ascorbic Acid (VITAMIN C) 500 MG CHEW [id number] Yes Chew 500 mg by mouth daily. Provider, Historical, [name] Taking Active Self           Med Note Jonathon Jordan Jun 15, 2021 12:06 PM)    aspirin EC 81 MG tablet [id number] Yes Take 1 tablet (81 mg total) by mouth daily. Heather Mocha,  [name] Taking Active Self  Blood Glucose Monitoring Suppl (TRUE METRIX AIR GLUCOSE METER) w/Device KIT [id number] Yes USE TO CHECK BLOOD SUGAR ONCE DAILY AND AS NEEDED.  DX CODE E11.9 Heather Lukes, [name] Taking Active Self  cephALEXin (KEFLEX) 250 MG capsule [id number] Yes Take 1 capsule (250 mg total) by mouth daily. Heather Folds, [name] Taking Active Self           Med Note Vita Barley Apr 17, 2021  7:58 AM) Continuous for 6 months  Cholecalciferol (VITAMIN D) 50 MCG (2000 UT) CAPS [id number] Yes Take 2,000 Units by mouth daily.  Provider, Historical, [name] Taking Active Self  diclofenac Sodium (VOLTAREN) 1 % GEL [id number] Yes Apply 1 application topically 4 (four) times daily as needed (hip pain). Provider, Historical, [name] Taking Active Self  dicyclomine (BENTYL) 20 MG tablet [id number] Yes Take 1 tablet (20 mg total) by mouth 2 (two) times daily as needed for spasms (abdominal cramping). Mesner, Corene Cornea, [name] Taking Active Self  famotidine (PEPCID) 20 MG tablet [id number] Yes Take 20 mg by mouth at bedtime as needed for heartburn or indigestion. Provider, Historical, [name] Taking Active Self  furosemide (LASIX) 20 MG tablet [id number] Yes Take 1 tablet  (20 mg total) by mouth daily.  Patient taking differently: Take 20 mg by mouth daily as needed for fluid.   Leeroy Bock, Heather [name] Taking Active            Med Note Alesia Banda, CHASIE F   Mon Apr 17, 2021  7:54 AM)    glucose blood (TRUE METRIX BLOOD GLUCOSE TEST) test strip [id number] Yes USE TO CHECK BLOOD SUGAR ONCE Heather DAY OR AS NEEDED.  DX CODE: E11.9 Heather Lukes, [name] Taking Active Self           Med Note Vita Barley Apr 17, 2021  7:54 AM)    isosorbide mononitrate (IMDUR) 60 MG 24 [name] tablet [id number] Yes Take 1.5 tablets (90 mg total) by mouth daily. Liliane Shi, PA-C Taking Active Self           Med Note Vita Barley Apr 17, 2021  7:55 AM)    losartan (COZAAR) 100 MG tablet [id number] Yes Take 0.5 tablets (50 mg total) by mouth 2 (two) times daily. Heather Mocha, [name] Taking Active Self           Med Note Vita Barley Apr 17, 2021  7:56 AM)    magnesium oxide (MAG-OX) 400 MG tablet [id number] Yes Take 400 mg by mouth at bedtime. Provider, Historical, [name] Taking Active Self           Med Note Vita Barley Apr 17, 2021  7:56 AM)    metoprolol succinate (TOPROL-XL) 25 MG 24 [name] tablet [id number] Yes Take 0.5 tablets (12.5 mg total) by mouth daily. Heather Mocha, [name] Taking Active   nitroGLYCERIN (NITROSTAT) 0.4 MG SL tablet [id number] Yes Place 1 tablet (0.4 mg total) under the tongue every 5 (five) minutes as needed for chest pain. [name] T, PA-C Taking Active Self  omeprazole (PRILOSEC) 20 MG capsule [id number] Yes TAKE 1 CAPSULE EVERY DAY AS NEEDED (NEED OFFICE VISIT BEFORE ANY FURTHER REFILLS) Heather Lukes, [name] Taking Active   ondansetron (ZOFRAN-ODT) 4 MG disintegrating tablet [id number] Yes Take 4 mg by mouth every 8 (eight) hours as needed for nausea or vomiting. Provider, Historical, [name] Taking Active Self  polyethylene glycol (MIRALAX / GLYCOLAX) 17 g packet [id number] Yes Take 17 g by mouth daily as needed for mild constipation.  Wellington Hampshire, PA-C Taking Active   pramipexole (MIRAPEX) 0.25 MG tablet [id number] Yes TAKE 1 TABLET TWICE DAILY Heather Lukes, [name] Taking Active            Med Note Vita Barley Apr 17, 2021  8:01 AM)    simvastatin (ZOCOR) 20 MG tablet [id number] Yes TAKE 1 TABLET AT BEDTIME Heather Mocha, [name] Taking Active            Med Note Vita Barley Apr 17, 2021  8:01 AM)    traMADol (ULTRAM) 50 MG tablet [id number] No Take 1 tablet (50 mg total) by mouth every 6 (six) hours as needed for severe pain.  Patient not taking: Reported on 06/15/2021   Wellington Hampshire, PA-C Not Taking Active   TRUEplus Lancets 33G MISC [id number] Yes USE TO CHECK BLOOD SUGAR ONCE DAILY AND AS NEEDED.  DX CODE: E11.9 Heather Lukes, [name] Taking Active Self  Zinc 50 MG TABS [id number] Yes Take 50 mg by mouth daily. Provider, Historical, [name] Taking Active Self            Patient Active Problem List   Diagnosis Date Noted   SBO (small bowel obstruction) ([place]) 04/16/2021   Inguinal hernia of right side with obstruction 04/16/2021   Dysphagia 02/16/2021   Tremor 02/16/2021   Sepsis ([place]) 02/05/2021   AKI (acute kidney injury) ([place]) 02/05/2021   Chronic diastolic CHF (congestive heart failure) ([place]) 02/05/2021   UTI (urinary tract infection) 01/31/2021   Acute on chronic diastolic CHF (congestive heart failure) ([place]) 01/28/2021   Intra-abdominal fluid collection 01/28/2021   Incarcerated incisional hernia s/p primary repair 01/25/2021 01/25/2021   Stress reaction of bone 01/05/2021   Pain of right sternoclavicular joint 01/05/2021   Screening for osteoporosis 01/05/2021   RUQ pain 12/01/2020   Chronic calculous cholecystitis s/p lap cholecystectomy 01/25/2021 12/01/2020   Low back pain radiating to right leg 07/14/2020   Pelvic relaxation due to rectocele 04/06/2020   Prolapse of female pelvic organs 12/22/2019   Greater trochanteric pain syndrome of right lower extremity 10/13/2019   Urinary  incontinence, mixed 10/01/2019   Pelvic floor relaxation 06/08/2019   Deep venous thrombosis ([place]) 03/19/2019   Goiter 03/19/2019   Transient ischemic attack 03/19/2019   Acute dermatitis 03/19/2019   Pelvic prolapse 03/08/2019   Pain of breast 01/29/2019   Overactive bladder 01/29/2019   Educated about COVID-19 virus infection 12/02/2018   Hyperlipidemia associated with type 2 diabetes mellitus ([place]) 05/27/2017   Headache 04/17/2015   Multinodular goiter 01/17/2015   S/P total knee arthroplasty 11/29/2014   Insomnia 02/05/2014   Preventative health care 11/22/2013   RLS (restless legs syndrome) 10/04/2013   Lower urinary tract infectious disease 10/04/2013   Cataracts, bilateral 04/02/2013   Hypokalemia 01/05/2012   Anemia 01/05/2012   Mixed anxiety and depressive disorder 01/05/2012   Postoperative anemia due to acute blood loss 11/15/2011   Staphylococcus aureus carrier 11/15/2011   Pre-syncope 10/13/2011   DM (diabetes mellitus) ([place]) 10/06/2011   Pulmonary nodule 10/06/2011   Epigastric pain 10/06/2011   It band syndrome, right 08/28/2011   Renal insufficiency 08/28/2011   CORONARY ATHEROSCLEROSIS NATIVE CORONARY ARTERY 03/07/2010   Coronary atherosclerosis 01/18/2009   FATIGUE 01/18/2009   PERSISTENT DISORDER INITIATING/MAINTAINING SLEEP 09/09/2008   DERMATITIS 10/16/2007   PERIPHERAL EDEMA 10/16/2007  Essential hypertension 04/14/2007   Gastroesophageal reflux disease with hiatal hernia 04/14/2007    Immunization History  Administered Date(s) Administered   Fluad Quad(high Dose 65+) 04/18/2021   Influenza Whole 06/08/2005, 06/29/2010   Influenza, High Dose Seasonal PF 04/05/2015, 06/18/2016, 05/27/2017, 05/30/2018   Influenza, Quadrivalent, Recombinant, Inj, Pf 04/22/2019   Influenza,inj,Quad PF,6+ Mos 04/30/2014   Influenza,inj,quad, With Preservative 05/06/2020   Moderna Sars-Covid-2 Vaccination 07/21/2019, 08/18/2019   PFIZER(Purple Top)SARS-COV-2  Vaccination 05/06/2020   Pfizer Covid-19 Vaccine Bivalent Booster 58yrs & up 05/03/2021   Pneumococcal Conjugate-13 11/17/2013   Pneumococcal Polysaccharide-23 07/09/2000, 05/27/2017   Tdap 05/24/2015   Zoster Recombinat (Shingrix) 05/03/2021    Conditions to be addressed/monitored: CHF, CAD, HTN, HLD, DMII and GERD; restless leg syndrome; osteopenia; urinary incontinance; rectocele; prolapse;  Care Plan : General Pharmacy (Adult)  Updates made by Heather [name], Heather [name] since 06/15/2021 12:00 AM     Problem: Hypertension, Hyperlipidemia/CAD, Diabetes, GERD, RLS   Priority: High  Onset Date: 09/06/2020     Goal: Provide education, support and care coordination for medication therapy and chronic conditions   Start Date: 09/06/2020  Expected End Date: 03/09/2021  This Visit's Progress: On track  Recent Progress: On track  Priority: High  Note:   Current Barriers:  Unable to achieve control of blood pressure - improving Maintain control of type 2 DM Recent acute exacerbation of CHF after surgery.  Frequent UTIs  Pharmacist Clinical Goal(s):  Over the next 180 days, patient will achieve adherence to monitoring guidelines and medication adherence to achieve therapeutic efficacy achieve control of blood pressure as evidenced by home monitoring Maintain control of type 2 DM as evidenced by A1c < 7.5% adhere to prescribed medication regimen as evidenced by fill dates Work with pharmacist and providers on improved access to medication therapy contact provider office for questions/concerns as evidenced notation of same in electronic health record through collaboration with PharmD and provider.    Interventions: 1:1 collaboration with Heather Lukes, [name] regarding development and update of comprehensive plan of care as evidenced by provider attestation and co-signature Inter-disciplinary care team collaboration (see longitudinal plan of care) Comprehensive medication review performed;  medication list updated in electronic medical record  Hypertension / CHF / Palpitations (BP goal <130/80) Improving; some BP reading still above goal.  EF was 60-65%  Monitored by cardiologist  Current treatment: Amlodipine 10mg  daily in the evening Losartan 100mg  - take 0.5 tablet = 50mg  twice Heather day Furosemide 20mg  daily as needed for edema / weight gain of more than 3 pounds in 1 day or 5 pounds in 1 week Medications previously tried: triamterene-hctz (listed in D/C meds. No apparent reason for D/C); spironolactone (acute renal insufficiency); Losartan 100mg  (acute renal insufficiency); metoprolol (low BP) Current home readings: 130 - 145 / 60's (has had Heather few high readings 162/71 and 158/68)  Potassium supplement stopped 05/12/2021. Potassium checked 06/14/2021 and was found to be 3.9.  Checking weight daily - reports no more than 1 to 2 lbs change Diet: limiting serving sizes; no extra sodium Denies hypotensive/hypertensive symptoms Interventions:  Educated on BP goals and benefits of medications for prevention of heart attack, stroke and kidney damage; Counseled to monitor BP at home 1 to 2 times per week, document, and provide log at future appointments Continue to monitor potassium since stopping supplementation. Recheck in 3 months (patient advised to call sooner if having any symptoms of low potassium - fatigue, muscle cramps / muscle weakness)   Hyperlipidemia/CAD: (LDL goal < 70)  Controlled Current treatment: Simvastatin 20mg  daily at bedtime Aspirin 81mg  Isosorbide ER 60mg  daily Medications previously tried: none noted  Current exercise habits: unable to exercise currently due to recent surgeries  Interventions:  Educated on Cholesterol goals;  Recommended to continue current medication  Diabetes (A1c goal <7.5) A1c back at goal.  Last A1c 6.4%.  Current medications: diet Medications previously tried: metformin Current home glucose readings Usually  - 100 to  125 Denies hypoglycemic/hyperglycemic symptoms Current Diet:  Reports no intake of sugar drinks Limiting meal sizes but not following any particular dietary guidelines per patient Interventions:  Reviewed home blood glucose readings and reviewed goals  Fasting blood glucose goal (before meals) = 80 to 130 Blood glucose goal after Heather meal = less than 180  Continue to check blood glucose daily - varying between morning and evening  Reviewed foods to limit that are high in carbohydrate (completed at previous visit)  RLS (Goal: control symptoms) Patient has been having more symptoms the last week - occurring mostly at night B12 was WNL at 920 (02/01/2021) Hemoglobin was 12.1 (04/26/2021) Current treatment  Pramipexole 0.25mg  twice daily Medications previously tried: gabapentin Interventions: Trial of taking pramipexole 0.25mg  2 tablets at night   Acid Reflux / GERD (goal: decrease acid reflux symptoms) Patient denies breakthrough reflux symptoms Has cholecystectomy 01/2021 Current regimen:  Famotidine 20mg  once Heather day at night if needed Omeprazole 20mg  once Heather day   Interventions: Discussed medications used to control acid reflux Goal is to use lowest dose needed to control symptoms Patient self care activities - Over the next 90 days, patient will: Continue current regimen   Osteopenia:  (goal: prevent fractures)  DEXA 01/16/2021 DualFemur Neck Right 01/16/2021 Osteopenia -1.6  DualFemur Neck Right 06/02/2018 Osteopenia -1.6  DualFemur Total Mean 01/16/2021 Normal -0.9  DualFemur Total Mean 06/02/2018 Normal -0.5  Left Forearm Radius 33% 01/16/2021 Normal -1.0  Left Forearm Radius 33% 06/02/2018 Normal -0.6   Major Osteoporotic Fracture: 13.8% Hip Fracture: 3.8% T-Scores noted to be stable.  Current regimen:  Vitamin D 2000 IU daily    Interventions: Continue current therapy for bone health  Frequent UTIs / Overactive Bladder Uncontrolled Managed by Heather Heather [name]  (gyno-urology) Current regimen:  Cephalexin 250mg  daily for UTI prophylaxis Interventions:  Continue to follow up with Heather Ruby Cola  Health Maintenance:  Reviewed vaccination history; updated history to reflect that patient has COVID bivalent booster 05/03/2021 at [facility] Reminded patient to get 2nd Shingrix vaccine at beginning of 2023 (1st was 05/03/2021) Forwarded message to PCP scheduler to make f/u appt with PCP in 2023.    Medication management Current pharmacy: [facility] Interventions Comprehensive medication review performed. Continue current medication management strategy Reviewed Med adherence  Patient Goals/Self-Care Activities Over the next 180 days, patient will:  take medications as prescribed focus on medication adherence by pill count check blood pressure 1 to 2 times per week, document, and provide at future appointments Check BG twice per week  Follow Up Plan: The care management team will reach out to the patient again over the next 90 days.         Medication Assistance: None required.  Patient affirms current coverage meets needs.  Patient's preferred pharmacy is:  [id number] [address], [address] [address] Phone: [phone] Fax: [phone]  [facility] [place], [address] [address] Phone: [phone] Fax: [phone]  Follow Up:  Patient agrees to Care Plan and Follow-up.  Plan: The care management team will reach out to the patient again over the next 90  days.  Heather [name], PharmD Clinical Pharmacist [facility] [phone]

## 2021-06-15 NOTE — Telephone Encounter (Signed)
Scheduled at first available, 10/09/21

## 2021-06-17 ENCOUNTER — Other Ambulatory Visit: Payer: Self-pay

## 2021-06-17 ENCOUNTER — Ambulatory Visit (HOSPITAL_BASED_OUTPATIENT_CLINIC_OR_DEPARTMENT_OTHER)
Admission: RE | Admit: 2021-06-17 | Discharge: 2021-06-17 | Disposition: A | Discharge status: Home / Self Care | Payer: Medicare HMO | Source: Ambulatory Visit | Attending: Family Medicine | Admitting: Family Medicine

## 2021-06-17 DIAGNOSIS — R1310 Dysphagia, unspecified: Secondary | ICD-10-CM | POA: Insufficient documentation

## 2021-06-17 DIAGNOSIS — E042 Nontoxic multinodular goiter: Secondary | ICD-10-CM | POA: Insufficient documentation

## 2021-06-18 ENCOUNTER — Other Ambulatory Visit: Payer: Self-pay | Admitting: Family Medicine

## 2021-06-18 DIAGNOSIS — E041 Nontoxic single thyroid nodule: Secondary | ICD-10-CM

## 2021-06-19 ENCOUNTER — Telehealth: Payer: Self-pay | Admitting: *Deleted

## 2021-06-19 NOTE — Telephone Encounter (Signed)
Mosie Lukes, MD  [name], RPH-CPP; Chism, Shaakira A, CMA; Kem Boroughs D, CMA Have ordered her Ultrasound please let her know and set her up for follow up in 2023 thanks        Previous Messages   ----- Message -----  From: [name], RPH-CPP  Sent: 06/15/2021  12:40 PM EST  To: Mosie Lukes, MD, Beatris Ship  Subject: appointment                                     Patient in need of follow appointment in 2023 with PCP, Dr [name].  Also GI notes mentioned patient should discuss getting repeat US of thyroid with PCP due to history of thyroid nodule and dysphagia. Dr [name] do you want to wait to order when you see her?   Tammy

## 2021-06-20 NOTE — Progress Notes (Signed)
[facility] Return Visit  SUBJECTIVE  History of Present Illness: Heather [name] is a 86 y.o. female seen in follow-up for overactive bladder. Plan today for monthly PTNS maintenance.   Overall the treatment has been helping during the day time with urgency. She is able to make it to the bathroom without leakage. She still has to urinate frequently at night but also has insomnia and is up several times per night.    Surgery: s/p Exam under anesthesia, posterior repair, sacrospinous ligament fixation, midurethral sling and cystoscopy on 10/27/20 as well as a sling release on 11/09/20 Past Medical History: Patient  has a past medical history of Chronic stable angina ([place]), Coronary artery disease (cardiologist--- dr Burt Knack), Diabetes mellitus type 2, diet-controlled ([place]), DOE (dyspnea on exertion), Echocardiogram 08/2020, Edema of right lower extremity, GERD (gastroesophageal reflux disease), Hiatal hernia, History of cervical cancer, History of DVT of lower extremity (2016), History of esophageal stricture, History of gastric ulcer (2005 approx.), History of palpitations (2010), History of TIA (transient ischemic attack) (06/1999), Hypertension, Intermittent palpitations, IT band syndrome, Migraine, Mixed hyperlipidemia, Mixed incontinence urge and stress, Multiple thyroid nodules, OA (osteoarthritis), Occasional tremors, Osteoporosis, Peroneal DVT (deep venous thrombosis) ([place]) (12/22/2014), Right bundle branch block (RBBB) with left anterior fascicular block (LAFB), RLS (restless legs syndrome), S/P drug eluting coronary stent placement (2006), and Urinary retention.   Past Surgical History: She  has a past surgical history that includes Knee arthroscopy (Bilateral, right ?/   left x2 , last one 09-12-2009 @ [facility]); Foot surgery (Left, 1990s); Upper gastrointestinal endoscopy (last one 04-25-2017); Total knee arthroplasty (11/12/2011); Total knee arthroplasty (Right, 11/29/2014); Colonoscopy  (last one ?); Cataract extraction w/ intraocular lens  implant, bilateral (2015); Hiatal hernia repair (1989); Anterior and posterior repair (N/A, 12/22/2019); Vaginal hysterectomy (1988); Coronary angioplasty with stent (03-16-2005   dr Lia Foyer); Coronary angioplasty with stent (06-27-2005  dr Lia Foyer); Rectocele repair (N/A, 04/20/2020); Rectocele repair (N/A, 10/27/2020); Anterior and posterior repair with sacrospinous fixation (N/A, 10/27/2020); Bladder suspension (N/A, 10/27/2020); Cystoscopy (N/A, 10/27/2020); Pubovaginal sling (N/A, 11/09/2020); Cystoscopy (N/A, 11/09/2020); Cholecystectomy (N/A, 01/25/2021); Incisional hernia repair (N/A, 01/25/2021); and Inguinal hernia repair (Left, 04/17/2021).   Medications: She has a current medication list which includes the following prescription(s): acetaminophen, amlodipine, vitamin c, aspirin ec, true metrix air glucose meter, cephalexin, vitamin d, diclofenac sodium, dicyclomine, famotidine, furosemide, true metrix blood glucose test, isosorbide mononitrate, losartan, magnesium oxide, metoprolol succinate, nitroglycerin, omeprazole, ondansetron, polyethylene glycol, pramipexole, simvastatin, tramadol, trueplus lancets 33g, and zinc.   Allergies: Patient is allergic to baclofen and nitrofurantoin.   Social History: Patient  reports that she has never smoked. She has never used smokeless tobacco. She reports that she does not drink alcohol and does not use drugs.      OBJECTIVE     Physical Exam: Vitals:   06/21/21 1143 06/21/21 1144  BP: (!) 196/75 (!) 162/66  Pulse: 66 63   Gen: No apparent distress, A&O x 3.  Detailed Urogynecologic Evaluation:  Deferred.    PTNS VISIT  Monthly PTNS maintenance  Procedure: The patient spontaneously voided prior to beginning the procedure. The patient was placed in the sitting position and the left lower extremity was prepped in the usual fashion. The PTNS needle was then inserted at a 60 degree angle, 5 cm  cephalad and 2 cm posterior to the medial malleolus. The PTNS unit was then programmed and an optimal response was  7 milliamps. The PTNS stimulation was then performed at this setting for 30  minutes without incident and the patient tolerated the procedure well. The needle was removed and hemostasis was noted.    ASSESSMENT AND PLAN    Heather [name] is a 86 y.o. with:  1. Overactive bladder    - Continue monthly PTNS treatments - We also discussed the option of sacral neuromodulation implant which can offer longer term treatment. This would require two procedures in the operating room. For now, she will continue with PTNS.   Jaquita Folds, MD   Time spent: I spent 15 minutes dedicated to the care of this patient on the date of this encounter to include pre-visit review of records, face-to-face time with the patient and post visit documentation. Additional time was spent on the procedure.

## 2021-06-21 ENCOUNTER — Ambulatory Visit (INDEPENDENT_AMBULATORY_CARE_PROVIDER_SITE_OTHER): Payer: Medicare HMO | Admitting: Obstetrics and Gynecology

## 2021-06-21 ENCOUNTER — Encounter: Payer: Self-pay | Admitting: Obstetrics and Gynecology

## 2021-06-21 ENCOUNTER — Other Ambulatory Visit: Payer: Self-pay

## 2021-06-21 VITALS — BP 162/66 | HR 63

## 2021-06-21 DIAGNOSIS — N3281 Overactive bladder: Secondary | ICD-10-CM

## 2021-07-12 ENCOUNTER — Encounter: Payer: Medicare HMO | Admitting: Gastroenterology

## 2021-07-13 ENCOUNTER — Encounter: Payer: Self-pay | Admitting: Family Medicine

## 2021-07-13 ENCOUNTER — Ambulatory Visit (INDEPENDENT_AMBULATORY_CARE_PROVIDER_SITE_OTHER): Payer: Medicare HMO | Admitting: Family Medicine

## 2021-07-13 ENCOUNTER — Ambulatory Visit: Payer: Self-pay

## 2021-07-13 VITALS — BP 160/72 | Ht 61.0 in | Wt 148.0 lb

## 2021-07-13 DIAGNOSIS — M7671 Peroneal tendinitis, right leg: Secondary | ICD-10-CM

## 2021-07-13 NOTE — Patient Instructions (Signed)
Good to see you Please try ice  Please try the boot when walking any distance  Please try the exercises   Please send me a message in MyChart with any questions or updates.  Please see me back in 2-3 weeks.   --Dr. Raeford Razor

## 2021-07-13 NOTE — Progress Notes (Signed)
PATRISHA HAUSMANN - 87 y.o. female MRN [id number]  Date of birth: [birth year]  SUBJECTIVE:  Including CC & ROS.  No chief complaint on file.   SHIREL MALLIS is a 87 y.o. female that is presenting with acute right ankle pain.  The pain is occurring over the lateral malleolus.  It is worse with certain movements and putting on her shoe.  Is acutely occurring.  No previous history of similar pain.   Review of Systems See HPI   HISTORY: Past Medical, Surgical, Social, and Family History Reviewed & Updated per EMR.   Pertinent Historical Findings include:  Past Medical History:  Diagnosis Date   Chronic stable angina ([place])    Coronary artery disease cardiologist--- dr Burt Knack   hx NSTEMI  w/ cardiac cath 03-16-2005 PCI with DES to LCx;   cardiac cath 06-27-2005  PCI with DES to RCA with residual dx LAD manage medcially;  lexiscan 01-26-20211 normal no ischemia, ef 70%;  stress echo w/ dobutamine 10-24-2011 negative ishcmeia , normal ef // Myoview 2/22: EF 67, no ischemia or infarction, no TID, low risk    Diabetes mellitus type 2, diet-controlled ([place])    followed by pcp  (10-19-2020  pt stated checks daily in am,  fasting blood surgar--- 115--120s)   DOE (dyspnea on exertion)    per pt "when I over do",  ok with household chores   Echocardiogram 08/2020    Echocardiogram 2/22: EF 55-60, no RWMA, mild LVH, Gr 2 DD, GLS-21.7%, normal RVSF, trivial MR, RVSP 39.5   Edema of right lower extremity    GERD (gastroesophageal reflux disease)    Hiatal hernia    recurrence,  hx HH repair 1989   History of cervical cancer    s/p  vaginal hysterectomy   History of DVT of lower extremity 2016   11-29-2014 post op right TKA of right lower extremity and completed xarelto    History of esophageal stricture    hx s/p dilatation's   History of gastric ulcer 2005 approx.   History of palpitations 2010   event monitor 07-07-2009 showed NSR w/ freq. SVT ectopies with short runs, rare PVCs   History of TIA  (transient ischemic attack) 06/1999   12-15-2019  per pt had several TIA between 12/ 2000 to 02/ 2001 , was sent to specialist @[facility] , had test that was normal (10-19-2020 pt stated no TIAs since ) but has residual of essential tremors of right arm/ hand   Hypertension    Intermittent palpitations    IT band syndrome    Migraine    "ice pick headche lasts about 30 seconds"   Mixed hyperlipidemia    Mixed incontinence urge and stress    Multiple thyroid nodules    followed by pcp---   ultrasound 11-22-2014 no bx   (12-15-2019 per pt had a endocrinologist and was told did not need bx)   OA (osteoarthritis)    knees, elbow, hip, ankles   Occasional tremors    right arm/ hand  s/p TIA residual 2000   Osteoporosis    taking vitamin d   Peroneal DVT (deep venous thrombosis) ([place]) 12/22/2014   Right bundle branch block (RBBB) with left anterior fascicular block (LAFB)    RLS (restless legs syndrome)    S/P drug eluting coronary stent placement 2006   03-16-2005  PCI x1 DES to LCx;   06-27-2005  PCI x1 DES to RCA   Urinary retention    post op sling prodecure  on 10-27-2020, has foley cathether    Past Surgical History:  Procedure Laterality Date   ANTERIOR AND POSTERIOR REPAIR N/A 12/22/2019   Procedure: ANTERIOR (CYSTOCELE)  REPAIR;  Surgeon: Janyth Contes, MD;  Location: [facility];  Service: Gynecology;  Laterality: N/A;   ANTERIOR AND POSTERIOR REPAIR WITH SACROSPINOUS FIXATION N/A 10/27/2020   Procedure: SACROSPINOUS LIGAMENT FIXATION;  Surgeon: Jaquita Folds, MD;  Location: [facility];  Service: Gynecology;  Laterality: N/A;   BLADDER SUSPENSION N/A 10/27/2020   Procedure: TRANSVAGINAL TAPE (TVT) PROCEDURE;  Surgeon: Jaquita Folds, MD;  Location: [facility];  Service: Gynecology;  Laterality: N/A;   CATARACT EXTRACTION W/ INTRAOCULAR LENS  IMPLANT, BILATERAL  2015   CHOLECYSTECTOMY N/A 01/25/2021   Procedure:  LAPAROSCOPIC CHOLECYSTECTOMY WITH INTRAOPERATIVE CHOLANGIOGRAM AND LYSIS OF ADHESIONS;  Surgeon: Michael Boston, MD;  Location: [facility];  Service: General;  Laterality: N/A;   COLONOSCOPY  last one ?   CORONARY ANGIOPLASTY WITH STENT PLACEMENT  03-16-2005   dr Lia Foyer   PCI and DES x1 to LCx   CORONARY ANGIOPLASTY WITH STENT PLACEMENT  06-27-2005  dr Lia Foyer   PCI and DES x1 to RCA with residual disease LAD 70-80% to manage medically   CYSTOSCOPY N/A 10/27/2020   Procedure: CYSTOSCOPY;  Surgeon: Jaquita Folds, MD;  Location: [facility];  Service: Gynecology;  Laterality: N/A;   CYSTOSCOPY N/A 11/09/2020   Procedure: CYSTOSCOPY;  Surgeon: Jaquita Folds, MD;  Location: [facility];  Service: Gynecology;  Laterality: N/A;   FOOT SURGERY Left 1990s   left foot stress fracture repair, per pt no hardware   HIATAL HERNIA REPAIR  1989   INCISIONAL HERNIA REPAIR N/A 01/25/2021   Procedure: PRIMARY REPAIR OF INCISIONAL HERNIA;  Surgeon: Michael Boston, MD;  Location: [facility];  Service: General;  Laterality: N/A;   INGUINAL HERNIA REPAIR Left 04/17/2021   Procedure: LAPAROSCOPIC LEFT INGUINAL HERNIA REPAIR WITH MESH;  Surgeon: Ralene Ok, MD;  Location: [facility];  Service: General;  Laterality: Left;   KNEE ARTHROSCOPY Bilateral right ?/   left x2 , last one 09-12-2009 @ [facility]   PUBOVAGINAL SLING N/A 11/09/2020   Procedure: REVISION OF PUBO-VAGINAL SLING;  Surgeon: Jaquita Folds, MD;  Location: [facility];  Service: Gynecology;  Laterality: N/A;   RECTOCELE REPAIR N/A 04/20/2020   Procedure: POSTERIOR REPAIR (RECTOCELE);  Surgeon: Janyth Contes, MD;  Location: [facility];  Service: Gynecology;  Laterality: N/A;   RECTOCELE REPAIR N/A 10/27/2020   Procedure: POSTERIOR REPAIR (RECTOCELE);  Surgeon: Jaquita Folds, MD;  Location: [facility];  Service: Gynecology;  Laterality: N/A;  total time  requested for all procedures is 2 hours   TOTAL KNEE ARTHROPLASTY  11/12/2011   Procedure: TOTAL KNEE ARTHROPLASTY;  Surgeon: Lorn Junes, MD;  Location: [facility];  Service: Orthopedics;  Laterality: Left;  Dr Noemi Chapel wants 90 minutes for this case   TOTAL KNEE ARTHROPLASTY Right 11/29/2014   Procedure: RIGHT TOTAL KNEE ARTHROPLASTY;  Surgeon: Vickey Huger, MD;  Location: [facility];  Service: Orthopedics;  Laterality: Right;   UPPER GASTROINTESTINAL ENDOSCOPY  last one 04-25-2017   with dilatation esophageal stricture and savary dilatation   VAGINAL HYSTERECTOMY  1988    no ovaries removed for bleeeding    Family History  Problem Relation Age of Onset   Stroke Father        family hx of M 1st degree relative <  15   Coronary artery disease Mother    Heart disease Mother    Depression Brother    Stroke Brother    Diabetes Brother    Cancer Brother        bladder with mets   Diabetes Daughter        borderline   Hypertension Daughter    Arthritis Other        family hx of   Hypertension Other        family hx of   Other Other        family hx of cardiovascular disorder   Thyroid disease Daughter    Breast cancer Neg Hx    Colon cancer Neg Hx    Anesthesia problems Neg Hx    Hypotension Neg Hx    Malignant hyperthermia Neg Hx    Pseudochol deficiency Neg Hx    Colon polyps Neg Hx    Esophageal cancer Neg Hx    Rectal cancer Neg Hx    Stomach cancer Neg Hx     Social History   Socioeconomic History   Marital status: Widowed    Spouse name: Not on file   Number of children: 3   Years of education: 97   Highest education level: Not on file  Occupational History   Occupation: works partime in Office manager: RETIRED    Comment: retired  Tobacco Use   Smoking status: Never   Smokeless tobacco: Never  Vaping Use   Vaping Use: Never used  Substance and Sexual Activity   Alcohol use: No    Alcohol/week: 0.0 standard drinks   Drug use: Never   Sexual activity: Not  Currently    Birth control/protection: Surgical    Comment: lives alone  Other Topics Concern   Not on file  Social History Narrative   Lives with husband, Caffeine use- Half Caffeine)- 2 cups daily.  3 children living, one passed away.   Education: HS. Business college.  Retired.    Social Determinants of Health   Financial Resource Strain: Low Risk    Difficulty of Paying Living Expenses: Not hard at all  Food Insecurity: No Food Insecurity   Worried About Charity fundraiser in the Last Year: Never true   [place] in the Last Year: Never true  Transportation Needs: No Transportation Needs   Lack of Transportation (Medical): No   Lack of Transportation (Non-Medical): No  Physical Activity: Insufficiently Active   Days of Exercise per Week: 3 days   Minutes of Exercise per Session: 10 min  Stress: Not on file  Social Connections: Moderately Isolated   Frequency of Communication with Friends and Family: More than three times a week   Frequency of Social Gatherings with Friends and Family: Once a week   Attends Religious Services: Never   Marine scientist or Organizations: Yes   Attends Archivist Meetings: Never   Marital Status: Widowed  Human resources officer Violence: Not on file     PHYSICAL EXAM:  VS: BP (!) 160/72 (BP Location: Left Arm, Patient Position: Sitting)    Ht 5\' 1"  (1.549 m)    Wt 148 lb (67.1 kg)    LMP  (LMP Unknown)    BMI 27.96 kg/m  Physical Exam Gen: NAD, alert, cooperative with exam, well-appearing   Limited ultrasound: Right ankle:  Scissoring appreciated of the peroneal tendons at the lateral malleolus with dynamic testing.  Encircling effusion around the  peroneal tendons as well as peroneal brevis down to the base of the fifth metatarsal. Degenerative changes of the midfoot with effusion. No ankle effusion appreciated  Summary: Peroneal tenosynovitis with likely tear of the retinaculum  Ultrasound and interpretation by Clearance Coots, MD     ASSESSMENT & PLAN:   Peroneal tendinitis of right lower extremity Acutely occurring.  Appears to have a retinacular tear with inflammation of the peroneal tendons. -Counseled on home exercise therapy and supportive care. -Cam walker and counseled on its use. -Could consider injection or physical therapy.

## 2021-07-13 NOTE — Assessment & Plan Note (Signed)
Acutely occurring.  Appears to have a retinacular tear with inflammation of the peroneal tendons. -Counseled on home exercise therapy and supportive care. -Cam walker and counseled on its use. -Could consider injection or physical therapy.

## 2021-07-26 ENCOUNTER — Ambulatory Visit: Payer: Medicare HMO | Admitting: Obstetrics and Gynecology

## 2021-07-26 ENCOUNTER — Other Ambulatory Visit: Payer: Self-pay

## 2021-07-26 ENCOUNTER — Encounter: Payer: Self-pay | Admitting: Obstetrics and Gynecology

## 2021-07-26 VITALS — BP 159/68 | HR 66

## 2021-07-26 DIAGNOSIS — N3281 Overactive bladder: Secondary | ICD-10-CM | POA: Diagnosis not present

## 2021-07-26 NOTE — Progress Notes (Signed)
[facility]  PTNS VISIT  CC:  Overactive bladder  87 y.o. with refractory overactive bladder who presents for percutaneous tibial nerve stimulation. The patient presents for PTNS monthly maintenance.  Urgency symptoms have been stable throughout the day. Able to make it to the bathroom. Has to get up occasionally at night- better if she does not have insomnia.    Procedure: The patient spontaneously voided prior to beginning the procedure. The patient was placed in the sitting position and the left lower extremity was prepped in the usual fashion. The PTNS needle was then inserted at a 60 degree angle, 5 cm cephalad and 2 cm posterior to the medial malleolus. The PTNS unit was then programmed and an optimal response was noted at a setting of  5 milliamps. The PTNS stimulation was then performed at this setting for 30 minutes without incident and the patient tolerated the procedure well. The needle was removed and hemostasis was noted.   The pt will return in 1 month All questions were answered.   Jaquita Folds, MD

## 2021-08-03 ENCOUNTER — Encounter: Payer: Self-pay | Admitting: Family Medicine

## 2021-08-03 ENCOUNTER — Ambulatory Visit (INDEPENDENT_AMBULATORY_CARE_PROVIDER_SITE_OTHER): Payer: Medicare HMO | Admitting: Family Medicine

## 2021-08-03 DIAGNOSIS — M7671 Peroneal tendinitis, right leg: Secondary | ICD-10-CM

## 2021-08-03 NOTE — Assessment & Plan Note (Signed)
Having some improvement of her swelling and subluxations. -Counseled on home exercise therapy and supportive care. -Give a trial of compression to stabilize the area. - if no improvement can consider injection.

## 2021-08-03 NOTE — Progress Notes (Signed)
Heather [name] - 87 y.o. female MRN [id number]  Date of birth: [birth year]  SUBJECTIVE:  Including CC & ROS.  No chief complaint on file.   Heather [name] is a 87 y.o. female that is following up for her right ankle pain.  She does use the cam walker but is unable to walk distances with the cam walker on.  It causes her to be unstable.  The swelling is still occurring.  She has had only 1 episode of subluxations of the tendons.    Review of Systems See HPI   HISTORY: Past Medical, Surgical, Social, and Family History Reviewed & Updated per EMR.   Pertinent Historical Findings include:  Past Medical History:  Diagnosis Date   Chronic stable angina ([place])    Coronary artery disease cardiologist--- dr Burt Knack   hx NSTEMI  w/ cardiac cath 03-16-2005 PCI with DES to LCx;   cardiac cath 06-27-2005  PCI with DES to RCA with residual dx LAD manage medcially;  lexiscan 01-26-20211 normal no ischemia, ef 70%;  stress echo w/ dobutamine 10-24-2011 negative ishcmeia , normal ef // Myoview 2/22: EF 67, no ischemia or infarction, no TID, low risk    Diabetes mellitus type 2, diet-controlled ([place])    followed by pcp  (10-19-2020  pt stated checks daily in am,  fasting blood surgar--- 115--120s)   DOE (dyspnea on exertion)    per pt "when I over do",  ok with household chores   Echocardiogram 08/2020    Echocardiogram 2/22: EF 55-60, no RWMA, mild LVH, Gr 2 DD, GLS-21.7%, normal RVSF, trivial MR, RVSP 39.5   Edema of right lower extremity    GERD (gastroesophageal reflux disease)    Hiatal hernia    recurrence,  hx HH repair 1989   History of cervical cancer    s/p  vaginal hysterectomy   History of DVT of lower extremity 2016   11-29-2014 post op right TKA of right lower extremity and completed xarelto    History of esophageal stricture    hx s/p dilatation's   History of gastric ulcer 2005 approx.   History of palpitations 2010   event monitor 07-07-2009 showed NSR w/ freq. SVT ectopies with  short runs, rare PVCs   History of TIA (transient ischemic attack) 06/1999   12-15-2019  per pt had several TIA between 12/ 2000 to 02/ 2001 , was sent to specialist @[facility] , had test that was normal (10-19-2020 pt stated no TIAs since ) but has residual of essential tremors of right arm/ hand   Hypertension    Intermittent palpitations    IT band syndrome    Migraine    "ice pick headche lasts about 30 seconds"   Mixed hyperlipidemia    Mixed incontinence urge and stress    Multiple thyroid nodules    followed by pcp---   ultrasound 11-22-2014 no bx   (12-15-2019 per pt had a endocrinologist and was told did not need bx)   OA (osteoarthritis)    knees, elbow, hip, ankles   Occasional tremors    right arm/ hand  s/p TIA residual 2000   Osteoporosis    taking vitamin d   Peroneal DVT (deep venous thrombosis) ([place]) 12/22/2014   Right bundle branch block (RBBB) with left anterior fascicular block (LAFB)    RLS (restless legs syndrome)    S/P drug eluting coronary stent placement 2006   03-16-2005  PCI x1 DES to LCx;   06-27-2005  PCI x1 DES  to RCA   Urinary retention    post op sling prodecure on 10-27-2020, has foley cathether    Past Surgical History:  Procedure Laterality Date   ANTERIOR AND POSTERIOR REPAIR N/A 12/22/2019   Procedure: ANTERIOR (CYSTOCELE)  REPAIR;  Surgeon: Janyth Contes, MD;  Location: [place];  Service: Gynecology;  Laterality: N/A;   ANTERIOR AND POSTERIOR REPAIR WITH SACROSPINOUS FIXATION N/A 10/27/2020   Procedure: SACROSPINOUS LIGAMENT FIXATION;  Surgeon: Jaquita Folds, MD;  Location: [facility];  Service: Gynecology;  Laterality: N/A;   BLADDER SUSPENSION N/A 10/27/2020   Procedure: TRANSVAGINAL TAPE (TVT) PROCEDURE;  Surgeon: Jaquita Folds, MD;  Location: [facility];  Service: Gynecology;  Laterality: N/A;   CATARACT EXTRACTION W/ INTRAOCULAR LENS  IMPLANT, BILATERAL  2015    CHOLECYSTECTOMY N/A 01/25/2021   Procedure: LAPAROSCOPIC CHOLECYSTECTOMY WITH INTRAOPERATIVE CHOLANGIOGRAM AND LYSIS OF ADHESIONS;  Surgeon: Michael Boston, MD;  Location: [facility];  Service: General;  Laterality: N/A;   COLONOSCOPY  last one ?   CORONARY ANGIOPLASTY WITH STENT PLACEMENT  03-16-2005   dr Lia Foyer   PCI and DES x1 to LCx   CORONARY ANGIOPLASTY WITH STENT PLACEMENT  06-27-2005  dr Lia Foyer   PCI and DES x1 to RCA with residual disease LAD 70-80% to manage medically   CYSTOSCOPY N/A 10/27/2020   Procedure: CYSTOSCOPY;  Surgeon: Jaquita Folds, MD;  Location: [facility];  Service: Gynecology;  Laterality: N/A;   CYSTOSCOPY N/A 11/09/2020   Procedure: CYSTOSCOPY;  Surgeon: Jaquita Folds, MD;  Location: [facility];  Service: Gynecology;  Laterality: N/A;   FOOT SURGERY Left 1990s   left foot stress fracture repair, per pt no hardware   HIATAL HERNIA REPAIR  1989   INCISIONAL HERNIA REPAIR N/A 01/25/2021   Procedure: PRIMARY REPAIR OF INCISIONAL HERNIA;  Surgeon: Michael Boston, MD;  Location[facility];  Service: General;  Laterality: N/A;   INGUINAL HERNIA REPAIR Left 04/17/2021   Procedure: LAPAROSCOPIC LEFT INGUINAL HERNIA REPAIR WITH MESH;  Surgeon: Ralene Ok, MD;  Location: [facility];  Service: General;  Laterality: Left;   KNEE ARTHROSCOPY Bilateral right ?/   left x2 , last one 09-12-2009 [facility]   PUBOVAGINAL SLING N/A 11/09/2020   Procedure: REVISION OF PUBO-VAGINAL SLING;  Surgeon: Jaquita Folds, MD;  Location: [facility];  Service: Gynecology;  Laterality: N/A;   RECTOCELE REPAIR N/A 04/20/2020   Procedure: POSTERIOR REPAIR (RECTOCELE);  Surgeon: Janyth Contes, MD;  Location: [facility];  Service: Gynecology;  Laterality: N/A;   RECTOCELE REPAIR N/A 10/27/2020   Procedure: POSTERIOR REPAIR (RECTOCELE);  Surgeon: Jaquita Folds, MD;  Location: [facility];  Service:  Gynecology;  Laterality: N/A;  total time requested for all procedures is 2 hours   TOTAL KNEE ARTHROPLASTY  11/12/2011   Procedure: TOTAL KNEE ARTHROPLASTY;  Surgeon: Lorn Junes, MD;  Location: [place];  Service: Orthopedics;  Laterality: Left;  Dr Noemi Chapel wants 90 minutes for this case   TOTAL KNEE ARTHROPLASTY Right 11/29/2014   Procedure: RIGHT TOTAL KNEE ARTHROPLASTY;  Surgeon: Vickey Huger, MD;  Location: [place];  Service: Orthopedics;  Laterality: Right;   UPPER GASTROINTESTINAL ENDOSCOPY  last one 04-25-2017   with dilatation esophageal stricture and savary dilatation   VAGINAL HYSTERECTOMY  1988    no ovaries removed for bleeeding     PHYSICAL EXAM:  VS: BP (!) 160/80 (BP Location: Left Arm, Patient  Position: Sitting)    Ht 5\' 1"  (1.549 m)    Wt 148 lb (67.1 kg)    LMP  (LMP Unknown)    BMI 27.96 kg/m  Physical Exam Gen: NAD, alert, cooperative with exam, well-appearing MSK:  Neurovascularly intact       ASSESSMENT & PLAN:   Peroneal tendinitis of right lower extremity Having some improvement of her swelling and subluxations. -Counseled on home exercise therapy and supportive care. -Give a trial of compression to stabilize the area. - if no improvement can consider injection.

## 2021-08-03 NOTE — Patient Instructions (Signed)
Good to see you Please try ice  Please try the compression  Please try the exercises   Please send me a message in MyChart with any questions or updates.  Please see me back in 4 weeks or sooner if needed.   --Dr. Raeford Razor

## 2021-08-24 NOTE — Progress Notes (Signed)
[facility]  PTNS VISIT  CC:  Overactive bladder  87 y.o. with refractory overactive bladder who presents for percutaneous tibial nerve stimulation. The patient presents for PTNS monthly maintenance.  Urgency symptoms have been ok during the day but at night she is still getting up several times. She is normally up at night for other reasons. She is not sure this is worth continuing and wondering if she should go back on a medication.   Vitals:   08/25/21 1106  BP: (!) 148/71  Pulse: 69   AAO x3  Procedure: The patient spontaneously voided prior to beginning the procedure. The patient was placed in the sitting position and the left lower extremity was prepped in the usual fashion. The PTNS needle was then inserted at a 60 degree angle, 5 cm cephalad and 2 cm posterior to the medial malleolus. The PTNS unit was then programmed and an optimal response was noted at a setting of  5 milliamps. The PTNS stimulation was then performed at this setting for 30 minutes without incident and the patient tolerated the procedure well. The needle was removed and hemostasis was noted.   - Samples of Gemtesa 75mg  provided to see if that helps her sympotms. She has previously been on Myrbetriq and oxybutynin.  - The pt will return in 1 month for follow up   Jaquita Folds, MD

## 2021-08-25 ENCOUNTER — Other Ambulatory Visit: Payer: Self-pay

## 2021-08-25 ENCOUNTER — Ambulatory Visit: Payer: Medicare HMO | Admitting: Obstetrics and Gynecology

## 2021-08-25 ENCOUNTER — Encounter: Payer: Self-pay | Admitting: Obstetrics and Gynecology

## 2021-08-25 VITALS — BP 148/71 | HR 69

## 2021-08-25 DIAGNOSIS — N3281 Overactive bladder: Secondary | ICD-10-CM | POA: Diagnosis not present

## 2021-08-25 MED ORDER — VIBEGRON 75 MG PO TABS
75.0000 mg | ORAL_TABLET | Freq: Every day | ORAL | 5 refills | Status: DC
Start: 2021-08-25 — End: 2022-03-27

## 2021-08-31 ENCOUNTER — Ambulatory Visit: Payer: Medicare HMO | Admitting: Family Medicine

## 2021-08-31 ENCOUNTER — Encounter: Payer: Self-pay | Admitting: Family Medicine

## 2021-08-31 DIAGNOSIS — M7671 Peroneal tendinitis, right leg: Secondary | ICD-10-CM

## 2021-08-31 NOTE — Progress Notes (Signed)
Heather [name] - 87 y.o. female MRN [id number]  Date of birth: Oct 23, 1934  SUBJECTIVE:  Including CC & ROS.  No chief complaint on file.   Heather [name] is a 87 y.o. female that is following up for her right ankle pain.  She has been doing well with no recurrent subluxations of the tendon.   Review of Systems See HPI   HISTORY: Past Medical, Surgical, Social, and Family History Reviewed & Updated per EMR.   Pertinent Historical Findings include:  Past Medical History:  Diagnosis Date   Chronic stable angina ([name])    Coronary artery disease cardiologist--- dr Burt Knack   hx NSTEMI  w/ cardiac cath 03-16-2005 PCI with DES to LCx;   cardiac cath 06-27-2005  PCI with DES to RCA with residual dx LAD manage medcially;  lexiscan 01-26-20211 normal no ischemia, ef 70%;  stress echo w/ dobutamine 10-24-2011 negative ishcmeia , normal ef // Myoview 2/22: EF 67, no ischemia or infarction, no TID, low risk    Diabetes mellitus type 2, diet-controlled ([name])    followed by pcp  (10-19-2020  pt stated checks daily in am,  fasting blood surgar--- 115--120s)   DOE (dyspnea on exertion)    per pt "when I over do",  ok with household chores   Echocardiogram 08/2020    Echocardiogram 2/22: EF 55-60, no RWMA, mild LVH, Gr 2 DD, GLS-21.7%, normal RVSF, trivial MR, RVSP 39.5   Edema of right lower extremity    GERD (gastroesophageal reflux disease)    Hiatal hernia    recurrence,  hx HH repair 1989   History of cervical cancer    s/p  vaginal hysterectomy   History of DVT of lower extremity 2016   11-29-2014 post op right TKA of right lower extremity and completed xarelto    History of esophageal stricture    hx s/p dilatation's   History of gastric ulcer 2005 approx.   History of palpitations 2010   event monitor 07-07-2009 showed NSR w/ freq. SVT ectopies with short runs, rare PVCs   History of TIA (transient ischemic attack) 06/1999   12-15-2019  per pt had several TIA between 12/ 2000 to 02/ 2001 ,  was sent to specialist @[facility] , had test that was normal (10-19-2020 pt stated no TIAs since ) but has residual of essential tremors of right arm/ hand   Hypertension    Intermittent palpitations    IT band syndrome    Migraine    "ice pick headche lasts about 30 seconds"   Mixed hyperlipidemia    Mixed incontinence urge and stress    Multiple thyroid nodules    followed by pcp---   ultrasound 11-22-2014 no bx   (12-15-2019 per pt had a endocrinologist and was told did not need bx)   OA (osteoarthritis)    knees, elbow, hip, ankles   Occasional tremors    right arm/ hand  s/p TIA residual 2000   Osteoporosis    taking vitamin d   Peroneal DVT (deep venous thrombosis) ([place]) 12/22/2014   Right bundle branch block (RBBB) with left anterior fascicular block (LAFB)    RLS (restless legs syndrome)    S/P drug eluting coronary stent placement 2006   03-16-2005  PCI x1 DES to LCx;   06-27-2005  PCI x1 DES to RCA   Urinary retention    post op sling prodecure on 10-27-2020, has foley cathether    Past Surgical History:  Procedure Laterality Date   ANTERIOR  AND POSTERIOR REPAIR N/A 12/22/2019   Procedure: ANTERIOR (CYSTOCELE)  REPAIR;  Surgeon: Janyth Contes, MD;  Location: [place];  Service: Gynecology;  Laterality: N/A;   ANTERIOR AND POSTERIOR REPAIR WITH SACROSPINOUS FIXATION N/A 10/27/2020   Procedure: SACROSPINOUS LIGAMENT FIXATION;  Surgeon: Jaquita Folds, MD;  Location: [facility];  Service: Gynecology;  Laterality: N/A;   BLADDER SUSPENSION N/A 10/27/2020   Procedure: TRANSVAGINAL TAPE (TVT) PROCEDURE;  Surgeon: Jaquita Folds, MD;  Location: [facility];  Service: Gynecology;  Laterality: N/A;   CATARACT EXTRACTION W/ INTRAOCULAR LENS  IMPLANT, BILATERAL  2015   CHOLECYSTECTOMY N/A 01/25/2021   Procedure: LAPAROSCOPIC CHOLECYSTECTOMY WITH INTRAOPERATIVE CHOLANGIOGRAM AND LYSIS OF ADHESIONS;  Surgeon: Michael Boston,  MD;  Location[facility];  Service: General;  Laterality: N/A;   COLONOSCOPY  last one ?   CORONARY ANGIOPLASTY WITH STENT PLACEMENT  03-16-2005   dr Lia Foyer   PCI and DES x1 to LCx   CORONARY ANGIOPLASTY WITH STENT PLACEMENT  06-27-2005  dr Lia Foyer   PCI and DES x1 to RCA with residual disease LAD 70-80% to manage medically   CYSTOSCOPY N/A 10/27/2020   Procedure: CYSTOSCOPY;  Surgeon: Jaquita Folds, MD;  Location: [facility];  Service: Gynecology;  Laterality: N/A;   CYSTOSCOPY N/A 11/09/2020   Procedure: CYSTOSCOPY;  Surgeon: Jaquita Folds, MD;  Location: [facility];  Service: Gynecology;  Laterality: N/A;   FOOT SURGERY Left 1990s   left foot stress fracture repair, per pt no hardware   HIATAL HERNIA REPAIR  1989   INCISIONAL HERNIA REPAIR N/A 01/25/2021   Procedure: PRIMARY REPAIR OF INCISIONAL HERNIA;  Surgeon: Michael Boston, MD;  [facility];  Service: General;  Laterality: N/A;   INGUINAL HERNIA REPAIR Left 04/17/2021   Procedure: LAPAROSCOPIC LEFT INGUINAL HERNIA REPAIR WITH MESH;  Surgeon: Ralene Ok, MD;  Location[facility];  Service: General;  Laterality: Left;   KNEE ARTHROSCOPY Bilateral right ?/   left x2 , last one 09-12-2009 @ [facility]   PUBOVAGINAL SLING N/A 11/09/2020   Procedure: REVISION OF PUBO-VAGINAL SLING;  Surgeon: Jaquita Folds, MD;  Location[facility];  Service: Gynecology;  Laterality: N/A;   RECTOCELE REPAIR N/A 04/20/2020   Procedure: POSTERIOR REPAIR (RECTOCELE);  Surgeon: Janyth Contes, MD;  Location: [facility];  Service: Gynecology;  Laterality: N/A;   RECTOCELE REPAIR N/A 10/27/2020   Procedure: POSTERIOR REPAIR (RECTOCELE);  Surgeon: Jaquita Folds, MD;  Location: [facility];  Service: Gynecology;  Laterality: N/A;  total time requested for all procedures is 2 hours   TOTAL KNEE ARTHROPLASTY  11/12/2011   Procedure: TOTAL KNEE ARTHROPLASTY;   Surgeon: Lorn Junes, MD;  Location: [place];  Service: Orthopedics;  Laterality: Left;  Dr Noemi Chapel wants 90 minutes for this case   TOTAL KNEE ARTHROPLASTY Right 11/29/2014   Procedure: RIGHT TOTAL KNEE ARTHROPLASTY;  Surgeon: Vickey Huger, MD;  Location: [place];  Service: Orthopedics;  Laterality: Right;   UPPER GASTROINTESTINAL ENDOSCOPY  last one 04-25-2017   with dilatation esophageal stricture and savary dilatation   VAGINAL HYSTERECTOMY  1988    no ovaries removed for bleeeding     PHYSICAL EXAM:  VS: BP 124/80 (BP Location: Left Arm, Patient Position: Sitting)    Ht 5\' 1"  (1.549 m)    Wt 148 lb (67.1 kg)    LMP  (LMP Unknown)    BMI 27.96 kg/m  Physical Exam Gen: NAD, alert, cooperative with exam, well-appearing MSK:  Neurovascularly intact       ASSESSMENT & PLAN:   Peroneal tendinitis of right lower extremity Has improved with measures thus far. -Counseled on home exercise therapy and supportive care. -Counseled on compression.

## 2021-08-31 NOTE — Assessment & Plan Note (Signed)
Has improved with measures thus far. -Counseled on home exercise therapy and supportive care. -Counseled on compression.

## 2021-09-04 ENCOUNTER — Other Ambulatory Visit: Payer: Self-pay | Admitting: Family Medicine

## 2021-09-04 DIAGNOSIS — H52223 Regular astigmatism, bilateral: Secondary | ICD-10-CM | POA: Diagnosis not present

## 2021-09-04 DIAGNOSIS — H524 Presbyopia: Secondary | ICD-10-CM | POA: Diagnosis not present

## 2021-09-04 DIAGNOSIS — R7309 Other abnormal glucose: Secondary | ICD-10-CM | POA: Diagnosis not present

## 2021-09-04 DIAGNOSIS — H43813 Vitreous degeneration, bilateral: Secondary | ICD-10-CM | POA: Diagnosis not present

## 2021-09-04 DIAGNOSIS — H5203 Hypermetropia, bilateral: Secondary | ICD-10-CM | POA: Diagnosis not present

## 2021-09-04 DIAGNOSIS — Z961 Presence of intraocular lens: Secondary | ICD-10-CM | POA: Diagnosis not present

## 2021-09-11 ENCOUNTER — Encounter: Payer: Self-pay | Admitting: Emergency Medicine

## 2021-09-11 ENCOUNTER — Other Ambulatory Visit: Payer: Self-pay | Admitting: Obstetrics and Gynecology

## 2021-09-11 ENCOUNTER — Telehealth: Payer: Medicare HMO

## 2021-09-11 ENCOUNTER — Emergency Department (HOSPITAL_COMMUNITY)
Admission: EM | Admit: 2021-09-11 | Discharge: 2021-09-11 | Disposition: A | Discharge status: Home / Self Care | Payer: Medicare HMO | Attending: Emergency Medicine | Admitting: Emergency Medicine

## 2021-09-11 ENCOUNTER — Emergency Department (HOSPITAL_COMMUNITY): Payer: Medicare HMO

## 2021-09-11 ENCOUNTER — Other Ambulatory Visit: Payer: Self-pay

## 2021-09-11 ENCOUNTER — Encounter (HOSPITAL_COMMUNITY): Payer: Self-pay

## 2021-09-11 ENCOUNTER — Ambulatory Visit
Admission: EM | Admit: 2021-09-11 | Discharge: 2021-09-11 | Disposition: A | Discharge status: Other Acute Inpatient Hospital | Payer: Medicare HMO

## 2021-09-11 DIAGNOSIS — Z20822 Contact with and (suspected) exposure to covid-19: Secondary | ICD-10-CM | POA: Insufficient documentation

## 2021-09-11 DIAGNOSIS — M542 Cervicalgia: Secondary | ICD-10-CM | POA: Diagnosis not present

## 2021-09-11 DIAGNOSIS — I2510 Atherosclerotic heart disease of native coronary artery without angina pectoris: Secondary | ICD-10-CM | POA: Insufficient documentation

## 2021-09-11 DIAGNOSIS — K5730 Diverticulosis of large intestine without perforation or abscess without bleeding: Secondary | ICD-10-CM | POA: Diagnosis not present

## 2021-09-11 DIAGNOSIS — Z79899 Other long term (current) drug therapy: Secondary | ICD-10-CM | POA: Insufficient documentation

## 2021-09-11 DIAGNOSIS — S39012A Strain of muscle, fascia and tendon of lower back, initial encounter: Secondary | ICD-10-CM | POA: Diagnosis not present

## 2021-09-11 DIAGNOSIS — Z7982 Long term (current) use of aspirin: Secondary | ICD-10-CM | POA: Insufficient documentation

## 2021-09-11 DIAGNOSIS — K5900 Constipation, unspecified: Secondary | ICD-10-CM

## 2021-09-11 DIAGNOSIS — R079 Chest pain, unspecified: Secondary | ICD-10-CM | POA: Diagnosis not present

## 2021-09-11 DIAGNOSIS — X509XXA Other and unspecified overexertion or strenuous movements or postures, initial encounter: Secondary | ICD-10-CM | POA: Insufficient documentation

## 2021-09-11 DIAGNOSIS — S199XXA Unspecified injury of neck, initial encounter: Secondary | ICD-10-CM | POA: Diagnosis present

## 2021-09-11 DIAGNOSIS — E119 Type 2 diabetes mellitus without complications: Secondary | ICD-10-CM | POA: Insufficient documentation

## 2021-09-11 DIAGNOSIS — M25511 Pain in right shoulder: Secondary | ICD-10-CM | POA: Diagnosis not present

## 2021-09-11 DIAGNOSIS — R0981 Nasal congestion: Secondary | ICD-10-CM | POA: Insufficient documentation

## 2021-09-11 DIAGNOSIS — N390 Urinary tract infection, site not specified: Secondary | ICD-10-CM

## 2021-09-11 DIAGNOSIS — Z7984 Long term (current) use of oral hypoglycemic drugs: Secondary | ICD-10-CM | POA: Diagnosis not present

## 2021-09-11 DIAGNOSIS — R059 Cough, unspecified: Secondary | ICD-10-CM | POA: Diagnosis not present

## 2021-09-11 DIAGNOSIS — S161XXA Strain of muscle, fascia and tendon at neck level, initial encounter: Secondary | ICD-10-CM | POA: Diagnosis not present

## 2021-09-11 DIAGNOSIS — Z9049 Acquired absence of other specified parts of digestive tract: Secondary | ICD-10-CM | POA: Diagnosis not present

## 2021-09-11 DIAGNOSIS — R051 Acute cough: Secondary | ICD-10-CM | POA: Diagnosis not present

## 2021-09-11 DIAGNOSIS — R058 Other specified cough: Secondary | ICD-10-CM

## 2021-09-11 DIAGNOSIS — I878 Other specified disorders of veins: Secondary | ICD-10-CM | POA: Diagnosis not present

## 2021-09-11 DIAGNOSIS — K449 Diaphragmatic hernia without obstruction or gangrene: Secondary | ICD-10-CM | POA: Diagnosis not present

## 2021-09-11 DIAGNOSIS — R109 Unspecified abdominal pain: Secondary | ICD-10-CM | POA: Diagnosis not present

## 2021-09-11 DIAGNOSIS — I10 Essential (primary) hypertension: Secondary | ICD-10-CM | POA: Insufficient documentation

## 2021-09-11 LAB — CBC WITH DIFFERENTIAL/PLATELET
Abs Immature Granulocytes: 0.02 10*3/uL (ref 0.00–0.07)
Basophils Absolute: 0 10*3/uL (ref 0.0–0.1)
Basophils Relative: 1 %
Eosinophils Absolute: 0.2 10*3/uL (ref 0.0–0.5)
Eosinophils Relative: 2 %
HCT: 38 % (ref 36.0–46.0)
Hemoglobin: 12.5 g/dL (ref 12.0–15.0)
Immature Granulocytes: 0 %
Lymphocytes Relative: 15 %
Lymphs Abs: 1.2 10*3/uL (ref 0.7–4.0)
MCH: 29.5 pg (ref 26.0–34.0)
MCHC: 32.9 g/dL (ref 30.0–36.0)
MCV: 89.6 fL (ref 80.0–100.0)
Monocytes Absolute: 0.8 10*3/uL (ref 0.1–1.0)
Monocytes Relative: 10 %
Neutro Abs: 5.9 10*3/uL (ref 1.7–7.7)
Neutrophils Relative %: 72 %
Platelets: 161 10*3/uL (ref 150–400)
RBC: 4.24 MIL/uL (ref 3.87–5.11)
RDW: 13.6 % (ref 11.5–15.5)
WBC: 8.2 10*3/uL (ref 4.0–10.5)
nRBC: 0 % (ref 0.0–0.2)

## 2021-09-11 LAB — LIPASE, BLOOD: Lipase: 40 U/L (ref 11–51)

## 2021-09-11 LAB — URINALYSIS, ROUTINE W REFLEX MICROSCOPIC
Bilirubin Urine: NEGATIVE
Glucose, UA: NEGATIVE mg/dL
Hgb urine dipstick: NEGATIVE
Ketones, ur: NEGATIVE mg/dL
Leukocytes,Ua: NEGATIVE
Nitrite: NEGATIVE
Protein, ur: NEGATIVE mg/dL
Specific Gravity, Urine: 1.008 (ref 1.005–1.030)
pH: 5 (ref 5.0–8.0)

## 2021-09-11 LAB — COMPREHENSIVE METABOLIC PANEL
ALT: 20 U/L (ref 0–44)
AST: 22 U/L (ref 15–41)
Albumin: 4.2 g/dL (ref 3.5–5.0)
Alkaline Phosphatase: 117 U/L (ref 38–126)
Anion gap: 4 — ABNORMAL LOW (ref 5–15)
BUN: 26 mg/dL — ABNORMAL HIGH (ref 8–23)
CO2: 28 mmol/L (ref 22–32)
Calcium: 8.8 mg/dL — ABNORMAL LOW (ref 8.9–10.3)
Chloride: 101 mmol/L (ref 98–111)
Creatinine, Ser: 1.01 mg/dL — ABNORMAL HIGH (ref 0.44–1.00)
GFR, Estimated: 55 mL/min — ABNORMAL LOW (ref >60)
Glucose, Bld: 119 mg/dL — ABNORMAL HIGH (ref 70–99)
Potassium: 4 mmol/L (ref 3.5–5.1)
Sodium: 133 mmol/L — ABNORMAL LOW (ref 135–145)
Total Bilirubin: 1 mg/dL (ref 0.3–1.2)
Total Protein: 6.8 g/dL (ref 6.5–8.1)

## 2021-09-11 LAB — RESP PANEL BY RT-PCR (FLU A&B, COVID) ARPGX2
Influenza A by PCR: NEGATIVE
Influenza B by PCR: NEGATIVE
SARS Coronavirus 2 by RT PCR: NEGATIVE

## 2021-09-11 LAB — TROPONIN I (HIGH SENSITIVITY): Troponin I (High Sensitivity): 6 ng/L (ref <18)

## 2021-09-11 MED ORDER — IOHEXOL 350 MG/ML SOLN
100.0000 mL | Freq: Once | INTRAVENOUS | Status: AC | PRN
  Administered 2021-09-11: 100 mL via INTRAVENOUS

## 2021-09-11 MED ORDER — BENZONATATE 100 MG PO CAPS: 100.0000 mg | ORAL_CAPSULE | Freq: Three times a day (TID) | ORAL | 0 refills | Status: DC

## 2021-09-11 MED ORDER — MORPHINE SULFATE (PF) 4 MG/ML IV SOLN
4.0000 mg | Freq: Once | INTRAVENOUS | Status: AC
  Administered 2021-09-11: 4 mg via INTRAVENOUS
  Filled 2021-09-11: qty 1

## 2021-09-11 NOTE — ED Triage Notes (Signed)
Patient c/o neck pain/stiffness since yesterday after working in the yard, a lot of repetitive movements.  Patient has not had BM in 3-4 days.  Patient has tried several OTC meds to help w/o any success. ?

## 2021-09-11 NOTE — ED Provider Notes (Signed)
[facility] DEPT Provider Note   CSN: [id number] Arrival date & time: 09/11/21  1634     History  Chief Complaint  Patient presents with   Shoulder Pain   Cough    Atkinson is a 87 y.o. female.  The history is provided by the patient, a relative and medical records. No language interpreter was used.  Shoulder Pain Cough  87 year old female with significant history of SBO, CAD, GERD, diabetes, osteoarthritis, sent here from urgent care with multiple complaints.  History obtained through patient and through daughter who is at bedside.  For the past 3 to 4 days patient has endorsed diffuse abdominal discomfort with associated lack of bowel movement and excessive flatus.  She reports she does not have any urge to have bowel movement.  She denies having nausea or vomiting.  She does endorse occasional cough and congestion as well as pain in her neck.  Cough is productive and has been ongoing for the past 3 weeks.  She noticed increasing right shoulder pain after doing yard work this weekend.  She endorses a history of degenerative disc disease.  Home Medications Prior to Admission medications   Medication Sig Start Date End Date Taking? Authorizing Provider  acetaminophen (TYLENOL) 500 MG tablet Take 1,000 mg by mouth every 6 (six) hours as needed for moderate pain.    [provider]  amLODipine (NORVASC) 10 MG tablet TAKE 1 TABLET EVERY DAY 09/04/21   Mosie Lukes, MD  Ascorbic Acid (VITAMIN C) 500 MG CHEW Chew 500 mg by mouth daily.    [provider]  aspirin EC 81 MG tablet Take 1 tablet (81 mg total) by mouth daily. 05/04/15   [name], MD  Blood Glucose Monitoring Suppl (TRUE METRIX AIR GLUCOSE METER) w/Device KIT USE TO CHECK BLOOD SUGAR ONCE DAILY AND AS NEEDED.  DX CODE E11.9 07/27/20   Mosie Lukes, MD  cephALEXin (KEFLEX) 250 MG capsule TAKE 1 CAPSULE BY MOUTH DAILY. 09/11/21   Jaquita Folds, MD   Cholecalciferol (VITAMIN D) 50 MCG (2000 UT) CAPS Take 2,000 Units by mouth daily.     [provider]  diclofenac Sodium (VOLTAREN) 1 % GEL Apply 1 application topically 4 (four) times daily as needed (hip pain).    [provider]  dicyclomine (BENTYL) 20 MG tablet Take 1 tablet (20 mg total) by mouth 2 (two) times daily as needed for spasms (abdominal cramping). 04/15/21   Mesner, Corene Cornea, MD  famotidine (PEPCID) 20 MG tablet Take 20 mg by mouth at bedtime as needed for heartburn or indigestion.    [provider]  furosemide (LASIX) 20 MG tablet Take 1 tablet (20 mg total) by mouth daily. Patient taking differently: Take 20 mg by mouth daily as needed for fluid. 03/29/21   Supple, Megan E, RPH-CPP  glucose blood (TRUE METRIX BLOOD GLUCOSE TEST) test strip USE TO CHECK BLOOD SUGAR ONCE A DAY OR AS NEEDED.  DX CODE: E11.9 07/27/20   Mosie Lukes, MD  isosorbide mononitrate (IMDUR) 60 MG 24 hr tablet Take 1.5 tablets (90 mg total) by mouth daily. 12/14/20 12/14/21  [name] T, PA-C  losartan (COZAAR) 100 MG tablet Take 0.5 tablets (50 mg total) by mouth 2 (two) times daily. 04/10/21   [name], MD  magnesium oxide (MAG-OX) 400 MG tablet Take 400 mg by mouth at bedtime.    [provider]  metoprolol succinate (TOPROL-XL) 25 MG 24 hr tablet Take 0.5  tablets (12.5 mg total) by mouth daily. 05/12/21   [name], MD  nitroGLYCERIN (NITROSTAT) 0.4 MG SL tablet Place 1 tablet (0.4 mg total) under the tongue every 5 (five) minutes as needed for chest pain. 08/29/20 08/29/21  [name] T, PA-C  omeprazole (PRILOSEC) 20 MG capsule TAKE 1 CAPSULE EVERY DAY AS NEEDED (NEED OFFICE VISIT BEFORE ANY FURTHER REFILLS) 05/29/21   Mosie Lukes, MD  ondansetron (ZOFRAN-ODT) 4 MG disintegrating tablet Take 4 mg by mouth every 8 (eight) hours as needed for nausea or vomiting.    [provider]  polyethylene glycol (MIRALAX / GLYCOLAX) 17 g packet Take 17 g by  mouth daily as needed for mild constipation. 04/18/21   Meuth, Brooke A, PA-C  pramipexole (MIRAPEX) 0.25 MG tablet TAKE 1 TABLET TWICE DAILY 03/14/21   Mosie Lukes, MD  simvastatin (ZOCOR) 20 MG tablet TAKE 1 TABLET AT BEDTIME 04/10/21   [name], MD  traMADol (ULTRAM) 50 MG tablet Take 1 tablet (50 mg total) by mouth every 6 (six) hours as needed for severe pain. 04/18/21   Meuth, Brooke A, PA-C  TRUEplus Lancets 33G MISC USE TO CHECK BLOOD SUGAR ONCE DAILY AND AS NEEDED.  DX CODE: E11.9 07/27/20   Mosie Lukes, MD  Vibegron 75 MG TABS Take 75 mg by mouth daily. 08/25/21   Jaquita Folds, MD  Zinc 50 MG TABS Take 50 mg by mouth daily.    [provider]      Allergies    Baclofen and Nitrofurantoin    Review of Systems   Review of Systems  Respiratory:  Positive for cough.   All other systems reviewed and are negative.  Physical Exam Updated Vital Signs BP (!) 182/79 (BP Location: Right Arm)    Pulse 86    Temp 98 F (36.7 C) (Oral)    Resp 15    Ht '5\' 1"'  (1.549 m)    Wt 66.7 kg    LMP  (LMP Unknown)    SpO2 95%    BMI 27.78 kg/m  Physical Exam Vitals and nursing note reviewed.  Constitutional:      General: She is not in acute distress.    Appearance: She is well-developed.  HENT:     Head: Atraumatic.  Eyes:     Conjunctiva/sclera: Conjunctivae normal.  Pulmonary:     Effort: Pulmonary effort is normal.     Breath sounds: No wheezing, rhonchi or rales.  Abdominal:     Tenderness: There is abdominal tenderness (Abdomen is mildly tender diffusely.  Bowel sounds present).  Musculoskeletal:     Cervical back: Normal range of motion and neck supple. Tenderness (Tenderness to palpation of bilateral cervical spine and paraspinal muscle) present.  Skin:    Findings: No rash.  Neurological:     Mental Status: She is alert.  Psychiatric:        Mood and Affect: Mood normal.    ED Results / Procedures / Treatments   Labs (all labs ordered are listed,  but only abnormal results are displayed) Labs Reviewed  COMPREHENSIVE METABOLIC PANEL - Abnormal; Notable for the following components:      Result Value   Sodium 133 (*)    Glucose, Bld 119 (*)    BUN 26 (*)    Creatinine, Ser 1.01 (*)    Calcium 8.8 (*)    GFR, Estimated 55 (*)    Anion gap 4 (*)    All other components within  normal limits  RESP PANEL BY RT-PCR (FLU A&B, COVID) ARPGX2  CBC WITH DIFFERENTIAL/PLATELET  LIPASE, BLOOD  URINALYSIS, ROUTINE W REFLEX MICROSCOPIC  TROPONIN I (HIGH SENSITIVITY)  TROPONIN I (HIGH SENSITIVITY)    EKG None  Radiology DG Abdomen Acute W/Chest  Result Date: 09/11/2021 CLINICAL DATA:  Possible small-bowel obstruction. Abdominal pain. History of hiatal hernia repair. EXAM: DG ABDOMEN ACUTE WITH 1 VIEW CHEST COMPARISON:  04/15/2021 FINDINGS: Cardiac silhouette and mediastinal contours are within normal limits with calcification again seen within aortic arch. The lungs are clear. Oval structure with internal air-fluid level corresponding to mild to moderate hiatal hernia unchanged. Cholecystectomy clips. Moderate stool throughout the colon. New staples overlie the left hemipelvis. Left-greater-than-right vascular phleboliths are similar to prior. Nonobstructed bowel-gas pattern. No subdiaphragmatic free air on upright view. Mild levocurvature centered at L1. Moderate multilevel degenerative disc changes. Moderate pubic symphysis osteoarthritis. IMPRESSION:: IMPRESSION: 1. No acute lung process. 2. Mild-to-moderate hiatal hernia. 3. Moderate to high-grade stool burden. Electronically Signed   By: Yvonne Kendall M.D.   On: 09/11/2021 17:36   CT Angio Chest/Abd/Pel for Dissection W and/or Wo Contrast  Result Date: 09/11/2021 CLINICAL DATA:  Chest and abdominal pain radiating into the right neck, initial encounter EXAM: CT ANGIOGRAPHY CHEST, ABDOMEN AND PELVIS TECHNIQUE: Non-contrast CT of the chest was initially obtained. Multidetector CT imaging through  the chest, abdomen and pelvis was performed using the standard protocol during bolus administration of intravenous contrast. Multiplanar reconstructed images and MIPs were obtained and reviewed to evaluate the vascular anatomy. RADIATION DOSE REDUCTION: This exam was performed according to the departmental dose-optimization program which includes automated exposure control, adjustment of the mA and/or kV according to patient size and/or use of iterative reconstruction technique. CONTRAST:  171m OMNIPAQUE IOHEXOL 350 MG/ML SOLN COMPARISON:  04/15/2021, 01/28/2021 FINDINGS: CTA CHEST FINDINGS Cardiovascular: Initial precontrast images demonstrate no hyperdense crescent to suggest acute aortic injury. Post-contrast images demonstrate scatter artifact from the venous bolus shadowing out the proximal aspect of the right innominate artery although reconstructions show it to be patent. Atherosclerotic calcifications are seen without aneurysmal dilatation or dissection. Heart is not significantly enlarged in size. Coronary calcifications are noted. Pulmonary artery shows a normal branching pattern without central pulmonary embolus. Timing was not performed for embolus evaluation however. Mediastinum/Nodes: Thoracic inlet shows a 13 mm hypodense nodule within the left lobe of the thyroid which appears stable from a prior exam from 2022. no sizable hilar or mediastinal adenopathy is noted. Sliding-type hiatal hernia is noted and stable. Lungs/Pleura: Lungs are well aerated bilaterally. A few calcified granulomas are seen. No focal infiltrate or sizable effusion is noted. No sizable parenchymal nodule is noted. Stable nodular scarring is noted in the anterior aspect of the left upper lobe. These changes are likely postinflammatory in nature. Musculoskeletal: Degenerative changes of the thoracic spine are noted. No rib abnormality is seen. No sternal fracture is noted. Review of the MIP images confirms the above findings. CTA  ABDOMEN AND PELVIS FINDINGS VASCULAR Aorta: Atherosclerotic calcifications are noted without aneurysmal dilatation or dissection. Celiac: Atherosclerotic calcifications are noted without focal hemodynamically significant stenosis. SMA: Atherosclerotic calcifications are noted at the origin without focal stenosis. Renals: Both renal arteries are patent without evidence of aneurysm, dissection, vasculitis, fibromuscular dysplasia or significant stenosis. IMA: Patent without evidence of aneurysm, dissection, vasculitis or significant stenosis. Inflow: Iliacs are within normal limits. Veins: No specific venous abnormality is noted. Review of the MIP images confirms the above findings. NON-VASCULAR Hepatobiliary: No focal liver  abnormality is seen. Status post cholecystectomy. No biliary dilatation. Pancreas: Unremarkable. No pancreatic ductal dilatation or surrounding inflammatory changes. Spleen: Normal in size without focal abnormality. Adrenals/Urinary Tract: Adrenal glands are within normal limits. Kidneys demonstrate renal cystic change on the right. No obstructive change is seen. No renal calculi are noted. The bladder is well distended. Stomach/Bowel: The colon shows no obstructive or inflammatory changes. Diverticulosis is noted without evidence of diverticulitis. The appendix is within normal limits. Stomach again demonstrates hiatal hernia. Small bowel is within normal limits. Lymphatic: No sizable lymphadenopathy is noted. Reproductive: Status post hysterectomy. No adnexal masses. Other: No abdominal wall hernia or abnormality. No abdominopelvic ascites. Musculoskeletal: Degenerative changes of lumbar spine are noted. Review of the MIP images confirms the above findings. IMPRESSION: No evidence of aortic dissection or aneurysmal dilatation. Changes of prior granulomatous disease. 13 mm hypodense lesion within the left lobe of the thyroid stable in appearance from the prior exam. Not clinically significant; no  follow-up imaging recommended (ref: J Am Coll Radiol. 2015 Feb;12(2): 143-50). Stable scarring in the lingula. Diverticulosis without diverticulitis. Aortic Atherosclerosis (ICD10-I70.0). Electronically Signed   By: Inez Catalina M.D.   On: 09/11/2021 20:19    Procedures Procedures    Medications Ordered in ED Medications  morphine (PF) 4 MG/ML injection 4 mg (4 mg Intravenous Given 09/11/21 1945)  iohexol (OMNIPAQUE) 350 MG/ML injection 100 mL (100 mLs Intravenous Contrast Given 09/11/21 1955)    ED Course/ Medical Decision Making/ A&P                           Medical Decision Making Amount and/or Complexity of Data Reviewed Labs: ordered. Radiology: ordered.  Risk Prescription drug management.   BP (!) 182/79 (BP Location: Right Arm)    Pulse 86    Temp 98 F (36.7 C) (Oral)    Resp 15    Ht '5\' 1"'  (1.549 m)    Wt 66.7 kg    LMP  (LMP Unknown)    SpO2 95%    BMI 27.78 kg/m   7:32 PM This is an 87 year old female with multiple comorbidities presenting with multiple complaints.  She is endorsing quite a bit of pain to the right side of her neck ongoing for the past 2 days.  She also endorsed cough productive with sputum for the past several weeks.  She endorsed having right shoulder pain and having abdominal pain.  She also having had a bowel movement in the past 3 to 4 days and have trouble passing flatus.  She has history of SBO in the past.  On exam, patient has exquisite tenderness to palpation of the right side of her neck without carotid bruit.  Pain worsened with neck movement.  Pain is not direct to her cervical spine and tends to be more soft tissue.  Lungs clear on auscultation, abdomen diffusely tender but bowel sounds present.  Digital rectal exam performed and without any evidence of stool impaction.  Due to patient's multiple comorbidities and having pain from her neck chest and abdomen, will perform dissection study.  Opiate pain medication given.  Patient also has slight  petechial rash to bilateral lower extremities.  She reports that has been ongoing for the past several weeks.  Fortunately platelets obtained today and is normal.  Rash is noninfectious and not consistent with allergic rash either.  Care discussed with Dr. Doren Custard.  9:14 PM Labs and imaging was independently reviewed reviewed and interpreted by  me.  Urinalysis obtained without any signs of urinary tract infection, normal WBC, normal H&H, electrolyte panels are reassuring, normal renal function, normal lipase, troponin is unremarkable, viral respiratory panel negative for COVID or flu.  A CT scan of the chest abdomen pelvis obtain without any acute finding.  No evidence of aortic dissection, no signs of pneumonia, no evidence of small bowel obstruction, no evidence of bony pathology.  She does have degenerative disc disease about the neck which may contribute to her presentation but I suspect this is all musculoskeletal pain.  I discussed all of the finding above with patient and with family member, will provide symptomatic treatment and outpatient follow-up with return precaution.  Patient voiced understanding and agrees with plan.   This patient presents to the ED for concern of multiple complaints, this involves an extensive number of treatment options, and is a complaint that carries with it a high risk of complications and morbidity.  The differential diagnosis includes SBO, MSK, PNA, Dissection, constipation, pleural effusion  Co morbidities that complicate the patient evaluation OA  GERD  CAD  DM   Additional history obtained:  Additional history obtained from daughter External records from outside source obtained and reviewed including notes from [facility]  Lab Tests:  I Ordered, and personally interpreted labs.  The pertinent results include:  as above  Imaging Studies ordered:  I ordered imaging studies including chest/abd/pelvis I independently visualized and interpreted imaging which  showed no acute finding I agree with the radiologist interpretation  Cardiac Monitoring:  The patient was maintained on a cardiac monitor.  I personally viewed and interpreted the cardiac monitored which showed an underlying rhythm of: NSR  Medicines ordered and prescription drug management:  I ordered medication including morphine  for neck pain and abd pain Reevaluation of the patient after these medicines showed that the patient improved I have reviewed the patients home medicines and have made adjustments as needed  Test Considered: as above  Critical Interventions: IV pain medication  CT scan  Consultations Obtained:  I requested consultation with the attending Dr. Doren Custard,  and discussed lab and imaging findings as well as pertinent plan - they recommend: outpt f/u  Problem List / ED Course: neck pain  Persitent cough  constipation  Reevaluation:  After the interventions noted above, I reevaluated the patient and found that they have :improved  Social Determinants of Health: age  Multiple comorbidities  Dispostion:  After consideration of the diagnostic results and the patients response to treatment, I feel that the patent would benefit from outpt f/u.         Final Clinical Impression(s) / ED Diagnoses Final diagnoses:  Acute strain of neck muscle, initial encounter  Recurrent cough  Constipation, unspecified constipation type    Rx / DC Orders ED Discharge Orders          Ordered    benzonatate (TESSALON) 100 MG capsule  Every 8 hours        09/11/21 2117              Domenic Moras, PA-C 09/11/21 2119    Godfrey Pick, MD 09/15/21 1140

## 2021-09-11 NOTE — Discharge Instructions (Signed)
You have been evaluated for your symptoms.  Please apply ice or cool compress or heating pad to the affected side of  neck multiple times daily as needed for symptom control as the pain is likely due to muscle strain.  Take Tessalon as needed for cough.  Continue with your bowel regimen which include MiraLAX, and Dulcolax to help regulate your bowel movement.  Follow-up with your doctor for further care. ?

## 2021-09-11 NOTE — ED Provider Notes (Signed)
[facility]    CSN: [id number] Arrival date & time: 09/11/21  1512      History   Chief Complaint Chief Complaint  Patient presents with   Torticollis    HPI Heather [name] is a 87 y.o. female.   Patient presents today with multiple different chief complaints.  Patient states that she is having some neck pain and stiffness since yesterday.  Denies any apparent injury but reports that it started after she was working in the yard and doing repetitive movements with her neck.  Deneis any numbness or tingling.  She does have a history of "disc disease".  Denies headache, blurred vision, dizziness, nausea, vomiting.  Patient also reports that she has not had a bowel movement in approximately 3 to 4 days.  She typically has bowel movements on a daily basis.  Denies any abdominal pain but reports that she has felt "bloated".  Patient reports that she has not been passing any flatulence.  She does have a history of bowel obstruction as well.  Denies any blood in stool prior to constipation.  Denies nausea or vomiting.  Patient also reports that she been having a cough over the past few days.  Cough started after she had a "reflux episode".  Denies any shortness of breath or chest pain.  Denies any fevers or upper respiratory symptoms.    Past Medical History:  Diagnosis Date   Chronic stable angina [facility])    Coronary artery disease cardiologist--- dr Burt Knack   hx NSTEMI  w/ cardiac cath 03-16-2005 PCI with DES to LCx;   cardiac cath 06-27-2005  PCI with DES to RCA with residual dx LAD manage medcially;  lexiscan 01-26-20211 normal no ischemia, ef 70%;  stress echo w/ dobutamine 10-24-2011 negative ishcmeia , normal ef // Myoview 2/22: EF 67, no ischemia or infarction, no TID, low risk    Diabetes mellitus type 2, diet-controlled ([place])    followed by pcp  (10-19-2020  pt stated checks daily in am,  fasting blood surgar--- 115--120s)   DOE (dyspnea on exertion)    per pt "when I  over do",  ok with household chores   Echocardiogram 08/2020    Echocardiogram 2/22: EF 55-60, no RWMA, mild LVH, Gr 2 DD, GLS-21.7%, normal RVSF, trivial MR, RVSP 39.5   Edema of right lower extremity    GERD (gastroesophageal reflux disease)    Hiatal hernia    recurrence,  hx HH repair 1989   History of cervical cancer    s/p  vaginal hysterectomy   History of DVT of lower extremity 2016   11-29-2014 post op right TKA of right lower extremity and completed xarelto    History of esophageal stricture    hx s/p dilatation's   History of gastric ulcer 2005 approx.   History of palpitations 2010   event monitor 07-07-2009 showed NSR w/ freq. SVT ectopies with short runs, rare PVCs   History of TIA (transient ischemic attack) 06/1999   12-15-2019  per pt had several TIA between 12/ 2000 to 02/ 2001 , was sent to specialist _0 , had test that was normal (10-19-2020 pt stated no TIAs since ) but has residual of essential tremors of right arm/ hand   Hypertension    Intermittent palpitations    IT band syndrome    Migraine    "ice pick headche lasts about 30 seconds"   Mixed hyperlipidemia    Mixed incontinence urge and stress    Multiple thyroid  nodules    followed by pcp---   ultrasound 11-22-2014 no bx   (12-15-2019 per pt had a endocrinologist and was told did not need bx)   OA (osteoarthritis)    knees, elbow, hip, ankles   Occasional tremors    right arm/ hand  s/p TIA residual 2000   Osteoporosis    taking vitamin d   Peroneal DVT (deep venous thrombosis) ([place]) 12/22/2014   Right bundle branch block (RBBB) with left anterior fascicular block (LAFB)    RLS (restless legs syndrome)    S/P drug eluting coronary stent placement 2006   03-16-2005  PCI x1 DES to LCx;   06-27-2005  PCI x1 DES to RCA   Urinary retention    post op sling prodecure on 10-27-2020, has foley cathether    Patient Active Problem List   Diagnosis Date Noted   Peroneal tendinitis of right lower extremity  07/13/2021   SBO (small bowel obstruction) ([place]) 04/16/2021   Inguinal hernia of right side with obstruction 04/16/2021   Dysphagia 02/16/2021   Tremor 02/16/2021   Sepsis ([place]) 02/05/2021   AKI (acute kidney injury) ([place]) 02/05/2021   Chronic diastolic CHF (congestive heart failure) ([place]) 02/05/2021   UTI (urinary tract infection) 01/31/2021   Acute on chronic diastolic CHF (congestive heart failure) ([place]) 01/28/2021   Intra-abdominal fluid collection 01/28/2021   Incarcerated incisional hernia s/p primary repair 01/25/2021 01/25/2021   Stress reaction of bone 01/05/2021   Pain of right sternoclavicular joint 01/05/2021   Screening for osteoporosis 01/05/2021   RUQ pain 12/01/2020   Chronic calculous cholecystitis s/p lap cholecystectomy 01/25/2021 12/01/2020   Low back pain radiating to right leg 07/14/2020   Pelvic relaxation due to rectocele 04/06/2020   Prolapse of female pelvic organs 12/22/2019   Greater trochanteric pain syndrome of right lower extremity 10/13/2019   Urinary incontinence, mixed 10/01/2019   Pelvic floor relaxation 06/08/2019   Deep venous thrombosis ([place]) 03/19/2019   Goiter 03/19/2019   Transient ischemic attack 03/19/2019   Acute dermatitis 03/19/2019   Pelvic prolapse 03/08/2019   Pain of breast 01/29/2019   Overactive bladder 01/29/2019   Educated about COVID-19 virus infection 12/02/2018   Hyperlipidemia associated with type 2 diabetes mellitus ([place]) 05/27/2017   Headache 04/17/2015   Multinodular goiter 01/17/2015   S/P total knee arthroplasty 11/29/2014   Insomnia 02/05/2014   Preventative health care 11/22/2013   RLS (restless legs syndrome) 10/04/2013   Lower urinary tract infectious disease 10/04/2013   Cataracts, bilateral 04/02/2013   Hypokalemia 01/05/2012   Anemia 01/05/2012   Mixed anxiety and depressive disorder 01/05/2012   Postoperative anemia due to acute blood loss 11/15/2011   Staphylococcus aureus carrier 11/15/2011    Pre-syncope 10/13/2011   DM (diabetes mellitus) ([place]) 10/06/2011   Pulmonary nodule 10/06/2011   Epigastric pain 10/06/2011   It band syndrome, right 08/28/2011   Renal insufficiency 08/28/2011   CORONARY ATHEROSCLEROSIS NATIVE CORONARY ARTERY 03/07/2010   Coronary atherosclerosis 01/18/2009   FATIGUE 01/18/2009   PERSISTENT DISORDER INITIATING/MAINTAINING SLEEP 09/09/2008   DERMATITIS 10/16/2007   PERIPHERAL EDEMA 10/16/2007   Essential hypertension 04/14/2007   Gastroesophageal reflux disease with hiatal hernia 04/14/2007    Past Surgical History:  Procedure Laterality Date   ANTERIOR AND POSTERIOR REPAIR N/A 12/22/2019   Procedure: ANTERIOR (CYSTOCELE)  REPAIR;  Surgeon: Janyth Contes, MD;  Location: [place];  Service: Gynecology;  Laterality: N/A;   ANTERIOR AND POSTERIOR REPAIR WITH SACROSPINOUS FIXATION N/A 10/27/2020   Procedure: SACROSPINOUS  LIGAMENT FIXATION;  Surgeon: Jaquita Folds, MD;  Location[facility];  Service: Gynecology;  Laterality: N/A;   BLADDER SUSPENSION N/A 10/27/2020   Procedure: TRANSVAGINAL TAPE (TVT) PROCEDURE;  Surgeon: Jaquita Folds, MD;  Location: [facility];  Service: Gynecology;  Laterality: N/A;   CATARACT EXTRACTION W/ INTRAOCULAR LENS  IMPLANT, BILATERAL  2015   CHOLECYSTECTOMY N/A 01/25/2021   Procedure: LAPAROSCOPIC CHOLECYSTECTOMY WITH INTRAOPERATIVE CHOLANGIOGRAM AND LYSIS OF ADHESIONS;  Surgeon: Michael Boston, MD;  Location: [facility];  Service: General;  Laterality: N/A;   COLONOSCOPY  last one ?   CORONARY ANGIOPLASTY WITH STENT PLACEMENT  03-16-2005   dr Lia Foyer   PCI and DES x1 to LCx   CORONARY ANGIOPLASTY WITH STENT PLACEMENT  06-27-2005  dr Lia Foyer   PCI and DES x1 to RCA with residual disease LAD 70-80% to manage medically   CYSTOSCOPY N/A 10/27/2020   Procedure: CYSTOSCOPY;  Surgeon: Jaquita Folds, MD;  Location: [facility];  Service:  Gynecology;  Laterality: N/A;   CYSTOSCOPY N/A 11/09/2020   Procedure: CYSTOSCOPY;  Surgeon: Jaquita Folds, MD;  Location: [facility];  Service: Gynecology;  Laterality: N/A;   FOOT SURGERY Left 1990s   left foot stress fracture repair, per pt no hardware   HIATAL HERNIA REPAIR  1989   INCISIONAL HERNIA REPAIR N/A 01/25/2021   Procedure: PRIMARY REPAIR OF INCISIONAL HERNIA;  Surgeon: Michael Boston, MD;  Location: [facility];  Service: General;  Laterality: N/A;   INGUINAL HERNIA REPAIR Left 04/17/2021   Procedure: LAPAROSCOPIC LEFT INGUINAL HERNIA REPAIR WITH MESH;  Surgeon: Ralene Ok, MD;  Location: [facility];  Service: General;  Laterality: Left;   KNEE ARTHROSCOPY Bilateral right ?/   left x2 , last one 09-12-2009 @ [facility]   PUBOVAGINAL SLING N/A 11/09/2020   Procedure: REVISION OF PUBO-VAGINAL SLING;  Surgeon: Jaquita Folds, MD;  Location: [facility];  Service: Gynecology;  Laterality: N/A;   RECTOCELE REPAIR N/A 04/20/2020   Procedure: POSTERIOR REPAIR (RECTOCELE);  Surgeon: Janyth Contes, MD;  Location: [facility];  Service: Gynecology;  Laterality: N/A;   RECTOCELE REPAIR N/A 10/27/2020   Procedure: POSTERIOR REPAIR (RECTOCELE);  Surgeon: Jaquita Folds, MD;  Location: [facility];  Service: Gynecology;  Laterality: N/A;  total time requested for all procedures is 2 hours   TOTAL KNEE ARTHROPLASTY  11/12/2011   Procedure: TOTAL KNEE ARTHROPLASTY;  Surgeon: Lorn Junes, MD;  Location: [place];  Service: Orthopedics;  Laterality: Left;  Dr Noemi Chapel wants 90 minutes for this case   TOTAL KNEE ARTHROPLASTY Right 11/29/2014   Procedure: RIGHT TOTAL KNEE ARTHROPLASTY;  Surgeon: Vickey Huger, MD;  Location: [place];  Service: Orthopedics;  Laterality: Right;   UPPER GASTROINTESTINAL ENDOSCOPY  last one 04-25-2017   with dilatation esophageal stricture and savary dilatation   VAGINAL HYSTERECTOMY  1988    no  ovaries removed for bleeeding    OB History     Gravida  4   Para  1   Term      Preterm  1   AB      Living  4      SAB      IAB      Ectopic      Multiple      Live Births               Home Medications    Prior to  Admission medications   Medication Sig Start Date End Date Taking? Authorizing Provider  acetaminophen (TYLENOL) 500 MG tablet Take 1,000 mg by mouth every 6 (six) hours as needed for moderate pain.   Yes [provider]  amLODipine (NORVASC) 10 MG tablet TAKE 1 TABLET EVERY DAY 09/04/21  Yes Mosie Lukes, MD  Ascorbic Acid (VITAMIN C) 500 MG CHEW Chew 500 mg by mouth daily.   Yes [provider]  aspirin EC 81 MG tablet Take 1 tablet (81 mg total) by mouth daily. 05/04/15  Yes [name], MD  Blood Glucose Monitoring Suppl (TRUE METRIX AIR GLUCOSE METER) w/Device KIT USE TO CHECK BLOOD SUGAR ONCE DAILY AND AS NEEDED.  DX CODE E11.9 07/27/20  Yes Mosie Lukes, MD  cephALEXin (KEFLEX) 250 MG capsule TAKE 1 CAPSULE BY MOUTH DAILY. 09/11/21  Yes Jaquita Folds, MD  Cholecalciferol (VITAMIN D) 50 MCG (2000 UT) CAPS Take 2,000 Units by mouth daily.    Yes [provider]  diclofenac Sodium (VOLTAREN) 1 % GEL Apply 1 application topically 4 (four) times daily as needed (hip pain).   Yes [provider]  dicyclomine (BENTYL) 20 MG tablet Take 1 tablet (20 mg total) by mouth 2 (two) times daily as needed for spasms (abdominal cramping). 04/15/21  Yes Mesner, Corene Cornea, MD  famotidine (PEPCID) 20 MG tablet Take 20 mg by mouth at bedtime as needed for heartburn or indigestion.   Yes [provider]  furosemide (LASIX) 20 MG tablet Take 1 tablet (20 mg total) by mouth daily. Patient taking differently: Take 20 mg by mouth daily as needed for fluid. 03/29/21  Yes Supple, Megan E, RPH-CPP  glucose blood (TRUE METRIX BLOOD GLUCOSE TEST) test strip USE TO CHECK BLOOD SUGAR ONCE A DAY OR AS NEEDED.  DX CODE: E11.9  07/27/20  Yes Mosie Lukes, MD  isosorbide mononitrate (IMDUR) 60 MG 24 hr tablet Take 1.5 tablets (90 mg total) by mouth daily. 12/14/20 12/14/21 Yes Weaver, Scott T, PA-C  losartan (COZAAR) 100 MG tablet Take 0.5 tablets (50 mg total) by mouth 2 (two) times daily. 04/10/21  Yes [name], MD  magnesium oxide (MAG-OX) 400 MG tablet Take 400 mg by mouth at bedtime.   Yes [provider]  metoprolol succinate (TOPROL-XL) 25 MG 24 hr tablet Take 0.5 tablets (12.5 mg total) by mouth daily. 05/12/21  Yes [name], MD  omeprazole (PRILOSEC) 20 MG capsule TAKE 1 CAPSULE EVERY DAY AS NEEDED (NEED OFFICE VISIT BEFORE ANY FURTHER REFILLS) 05/29/21  Yes Mosie Lukes, MD  ondansetron (ZOFRAN-ODT) 4 MG disintegrating tablet Take 4 mg by mouth every 8 (eight) hours as needed for nausea or vomiting.   Yes [provider]  polyethylene glycol (MIRALAX / GLYCOLAX) 17 g packet Take 17 g by mouth daily as needed for mild constipation. 04/18/21  Yes Meuth, Brooke A, PA-C  pramipexole (MIRAPEX) 0.25 MG tablet TAKE 1 TABLET TWICE DAILY 03/14/21  Yes Mosie Lukes, MD  simvastatin (ZOCOR) 20 MG tablet TAKE 1 TABLET AT BEDTIME 04/10/21  Yes [name], MD  traMADol (ULTRAM) 50 MG tablet Take 1 tablet (50 mg total) by mouth every 6 (six) hours as needed for severe pain. 04/18/21  Yes Meuth, Brooke A, PA-C  TRUEplus Lancets 33G MISC USE TO CHECK BLOOD SUGAR ONCE DAILY AND AS NEEDED.  DX CODE: E11.9 07/27/20  Yes Mosie Lukes, MD  Vibegron 75 MG TABS Take 75 mg by mouth daily. 08/25/21  Yes  Jaquita Folds, MD  Zinc 50 MG TABS Take 50 mg by mouth daily.   Yes [provider]  nitroGLYCERIN (NITROSTAT) 0.4 MG SL tablet Place 1 tablet (0.4 mg total) under the tongue every 5 (five) minutes as needed for chest pain. 08/29/20 08/29/21  Liliane Shi, PA-C    Family History Family History  Problem Relation Age of Onset   Stroke Father        family hx of M 1st degree relative  <50   Coronary artery disease Mother    Heart disease Mother    Depression Brother    Stroke Brother    Diabetes Brother    Cancer Brother        bladder with mets   Diabetes Daughter        borderline   Hypertension Daughter    Arthritis Other        family hx of   Hypertension Other        family hx of   Other Other        family hx of cardiovascular disorder   Thyroid disease Daughter    Breast cancer Neg Hx    Colon cancer Neg Hx    Anesthesia problems Neg Hx    Hypotension Neg Hx    Malignant hyperthermia Neg Hx    Pseudochol deficiency Neg Hx    Colon polyps Neg Hx    Esophageal cancer Neg Hx    Rectal cancer Neg Hx    Stomach cancer Neg Hx     Social History Social History   Tobacco Use   Smoking status: Never   Smokeless tobacco: Never  Vaping Use   Vaping Use: Never used  Substance Use Topics   Alcohol use: No    Alcohol/week: 0.0 standard drinks   Drug use: Never     Allergies   Baclofen and Nitrofurantoin   Review of Systems Review of Systems Per HPI  Physical Exam Triage Vital Signs ED Triage Vitals  Enc Vitals Group     BP 09/11/21 1543 (!) 173/64     Pulse Rate 09/11/21 1543 69     Resp 09/11/21 1543 20     Temp 09/11/21 1543 98.1 F (36.7 C)     Temp Source 09/11/21 1543 Oral     SpO2 09/11/21 1543 97 %     Weight --      Height --      Head Circumference --      Peak Flow --      Pain Score 09/11/21 1545 10     Pain Loc --      Pain Edu? --      Excl. in [place]? --    No data found.  Updated Vital Signs BP (!) 173/64 (BP Location: Left Arm)    Pulse 69    Temp 98.1 F (36.7 C) (Oral)    Resp 20    LMP  (LMP Unknown)    SpO2 97%   Visual Acuity Right Eye Distance:   Left Eye Distance:   Bilateral Distance:    Right Eye Near:   Left Eye Near:    Bilateral Near:     Physical Exam Constitutional:      General: She is not in acute distress.    Appearance: Normal appearance. She is not toxic-appearing or diaphoretic.   HENT:     Head: Normocephalic and atraumatic.  Eyes:     Extraocular Movements: Extraocular movements intact.  Conjunctiva/sclera: Conjunctivae normal.  Neck:     Comments: Tenderness to palpation to right paraspinal muscles that extends slightly into right trapezius muscle.  No direct spinal tenderness, crepitus, step-off. Cardiovascular:     Rate and Rhythm: Normal rate and regular rhythm.     Pulses: Normal pulses.     Heart sounds: Normal heart sounds.  Pulmonary:     Effort: Pulmonary effort is normal.     Comments: Crackles in bilateral lung bases Abdominal:     General: Bowel sounds are decreased.     Palpations: Abdomen is soft.     Tenderness: There is no abdominal tenderness.  Musculoskeletal:     Cervical back: No crepitus. Pain with movement and muscular tenderness present. No spinous process tenderness.  Neurological:     General: No focal deficit present.     Mental Status: She is alert and oriented to person, place, and time. Mental status is at baseline.  Psychiatric:        Mood and Affect: Mood normal.        Behavior: Behavior normal.        Thought Content: Thought content normal.        Judgment: Judgment normal.     UC Treatments / Results  Labs (all labs ordered are listed, but only abnormal results are displayed) Labs Reviewed - No data to display  EKG   Radiology No results found.  Procedures Procedures (including critical care time)  Medications Ordered in UC Medications - No data to display  Initial Impression / Assessment and Plan / UC Course  I have reviewed the triage vital signs and the nursing notes.  Pertinent labs & imaging results that were available during my care of the patient were reviewed by me and considered in my medical decision making (see chart for details).     There are multiple different concerns for patient given that she is having constipation and decreased flatulence as well as decreased bowel sounds.   Patient has history of bowel obstruction and I do think that patient needs a further evaluation for this.  She also has crackles in bilateral bases of lungs with associated possible aspiration of reflux.  I also think the patient would benefit from a CT scan of the neck given severity of neck pain and decreased range of motion.  It is likely that it could be muscular in nature but further evaluation is warranted.  Advised patient that she will need to go to the emergency department for further evaluation and management given that I am not able to order CT imaging or provide the resources necessary for further evaluation and management given physical exam and history.  Patient and caregiver agreeable with plan.  Vital signs stable at discharge.  Agree with patient self transport to the hospital. Final Clinical Impressions(s) / UC Diagnoses   Final diagnoses:  Constipation, unspecified constipation type  Neck pain  Acute cough     Discharge Instructions      Please go to the emergency department as soon as you leave urgent care for further evaluation and management.    ED Prescriptions   None    PDMP not reviewed this encounter.   Teodora Medici, [place] 09/11/21 [phone]

## 2021-09-11 NOTE — Discharge Instructions (Signed)
Please go to the emergency department as soon as you leave urgent care for further evaluation and management. ?

## 2021-09-11 NOTE — ED Notes (Signed)
Patient transported to CT 

## 2021-09-11 NOTE — ED Triage Notes (Signed)
Patient c/o no BM in 3-4 days. Patient went to an UC and did not hear any bowel sounds. Patient is not passing gas. Patient has a history of the same. ? ?Patient c/o right shoulder pain after doing yard work this weekend. ? ?Patient also has a productive cough with thick white/yellow sputum x 3 weeks. ?

## 2021-09-11 NOTE — ED Notes (Signed)
Pt NAD, a/ox4. Pt verbalizes understanding of all DC and f/u instructions. All questions answered. Pt walks with steady gait to lobby at DC.  ? ?

## 2021-09-11 NOTE — ED Notes (Signed)
Pt NAD in bed, a/ox4. Pt states she has had no BM in 4 days which is abnormal for her, and also a cough for "awhile". Pt denies CP, SOB, weakness, ABD pain, n/v/d and states she has no "symptoms" of needing to have BM. ABD soft nontender. LS clear.  ?

## 2021-09-11 NOTE — ED Provider Triage Note (Signed)
Emergency Medicine Provider Triage Evaluation Note ? ?Heather [name] , a 87 y.o. female  was evaluated in triage.  Pt complains of abd pain. Pt with hx of SBO here with abd pain, no BM x 3 days, now having shoulder pain, cough, and congestion.  Sent from [facility] ? ?Review of Systems  ?Positive: Abd pain, cough, congestion, shoulder pain, no BM x 3 days, decreased flatus ?Negative: Fever, n/v, change in appetite ? ?Physical Exam  ?BP (!) 182/79 (BP Location: Right Arm)   Pulse 86   Temp 98 ?F (36.7 ?C) (Oral)   Resp 15   LMP  (LMP Unknown)   SpO2 95%  ?Gen:   Awake, no distress   ?Resp:  Normal effort  ?MSK:   Moves extremities without difficulty  ?Other:   ? ?Medical Decision Making  ?Medically screening exam initiated at 4:57 PM.  Appropriate orders placed.  Heather [name] was informed that the remainder of the evaluation will be completed by another provider, this initial triage assessment does not replace that evaluation, and the importance of remaining in the ED until their evaluation is complete. ? ?Possible SBO. ?  ?Domenic Moras, PA-C ?09/11/21 1658 ? ?

## 2021-09-13 ENCOUNTER — Telehealth: Payer: Medicare HMO

## 2021-09-21 ENCOUNTER — Other Ambulatory Visit: Payer: Self-pay | Admitting: Family Medicine

## 2021-09-21 NOTE — Progress Notes (Signed)
[facility] ?Return Visit ? ?SUBJECTIVE  ?History of Present Illness: ?Heather [name] is a 87 y.o. female seen in follow-up for overactive bladder and recurrent UTI.  ? ?Has been doing monthly maintenance PTNS. Last month started on Gemtesa '75mg'$  daily. She says she has seen great results and has no leakage all day or night and wants to continue the medication.  ? ?Has completed the 6 month course of keflex for recurrent UTI.  ? ? ?Past Medical History: ?Patient  has a past medical history of Chronic stable angina ([place]), Coronary artery disease (cardiologist--- dr Burt Knack), Diabetes mellitus type 2, diet-controlled ([place]), DOE (dyspnea on exertion), Echocardiogram 08/2020, Edema of right lower extremity, GERD (gastroesophageal reflux disease), Hiatal hernia, History of cervical cancer, History of DVT of lower extremity (2016), History of esophageal stricture, History of gastric ulcer (2005 approx.), History of palpitations (2010), History of TIA (transient ischemic attack) (06/1999), Hypertension, Intermittent palpitations, IT band syndrome, Migraine, Mixed hyperlipidemia, Mixed incontinence urge and stress, Multiple thyroid nodules, OA (osteoarthritis), Occasional tremors, Osteoporosis, Peroneal DVT (deep venous thrombosis) ([place]) (12/22/2014), Right bundle branch block (RBBB) with left anterior fascicular block (LAFB), RLS (restless legs syndrome), S/P drug eluting coronary stent placement (2006), and Urinary retention.  ? ?Past Surgical History: ?She  has a past surgical history that includes Knee arthroscopy (Bilateral, right ?/   left x2 , last one 09-12-2009 @ [facility]); Foot surgery (Left, 1990s); Upper gastrointestinal endoscopy (last one 04-25-2017); Total knee arthroplasty (11/12/2011); Total knee arthroplasty (Right, 11/29/2014); Colonoscopy (last one ?); Cataract extraction w/ intraocular lens  implant, bilateral (2015); Hiatal hernia repair (1989); Anterior and posterior repair (N/A, 12/22/2019);  Vaginal hysterectomy (1988); Coronary angioplasty with stent (03-16-2005   dr Lia Foyer); Coronary angioplasty with stent (06-27-2005  dr Lia Foyer); Rectocele repair (N/A, 04/20/2020); Rectocele repair (N/A, 10/27/2020); Anterior and posterior repair with sacrospinous fixation (N/A, 10/27/2020); Bladder suspension (N/A, 10/27/2020); Cystoscopy (N/A, 10/27/2020); Pubovaginal sling (N/A, 11/09/2020); Cystoscopy (N/A, 11/09/2020); Cholecystectomy (N/A, 01/25/2021); Incisional hernia repair (N/A, 01/25/2021); and Inguinal hernia repair (Left, 04/17/2021).  ? ?Medications: ?She has a current medication list which includes the following prescription(s): acetaminophen, amlodipine, vitamin c, aspirin ec, benzonatate, true metrix air glucose meter, vitamin d, diclofenac sodium, dicyclomine, [START ON 09/25/2021] estradiol, famotidine, furosemide, true metrix blood glucose test, isosorbide mononitrate, losartan, magnesium oxide, metoprolol succinate, omeprazole, ondansetron, polyethylene glycol, pramipexole, simvastatin, tramadol, trueplus lancets 33g, vibegron, zinc, and nitroglycerin.  ? ?Allergies: ?Patient is allergic to baclofen and nitrofurantoin.  ? ?Social History: ?Patient  reports that she has never smoked. She has never used smokeless tobacco. She reports that she does not drink alcohol and does not use drugs.  ?  ?  ?OBJECTIVE  ?  ? ?Physical Exam: ?Vitals:  ? 09/22/21 1125  ?BP: (!) 159/72  ?Pulse: 76  ? ?Gen: No apparent distress, A&O x 3. ? ?Detailed Urogynecologic Evaluation:  ?Deferred.   ? ?ASSESSMENT AND PLAN  ?  ?Ms. Pontiff is a 87 y.o. with:  ?1. Recurrent urinary tract infection   ?2. Overactive bladder   ? ?- For rUTI, start vaginal estrace cream 0.5g nightly for two weeks then twice weekly after. Does not need to continue antibiotics at this time.  ?- Continue with Gemtresa '75mg'$  daily.  ? ?Return 6 months or sooner if needed.  ? ?Jaquita Folds, MD ? ?

## 2021-09-22 ENCOUNTER — Other Ambulatory Visit: Payer: Self-pay

## 2021-09-22 ENCOUNTER — Ambulatory Visit: Payer: Medicare HMO | Admitting: Obstetrics and Gynecology

## 2021-09-22 ENCOUNTER — Encounter: Payer: Self-pay | Admitting: Obstetrics and Gynecology

## 2021-09-22 VITALS — BP 159/72 | HR 76

## 2021-09-22 DIAGNOSIS — N3281 Overactive bladder: Secondary | ICD-10-CM | POA: Diagnosis not present

## 2021-09-22 DIAGNOSIS — N390 Urinary tract infection, site not specified: Secondary | ICD-10-CM | POA: Diagnosis not present

## 2021-09-22 MED ORDER — ESTRADIOL 0.1 MG/GM VA CREA: 0.5000 g | TOPICAL_CREAM | VAGINAL | 11 refills | Status: DC

## 2021-09-22 NOTE — Patient Instructions (Signed)
Start vaginal estrogen therapy nightly for two weeks then 2 times weekly at night for treatment of vaginal atrophy (dryness of the vaginal tissues).  Please let us know if the prescription is too expensive and we can look for alternative options.   

## 2021-09-26 LAB — HM DIABETES EYE EXAM

## 2021-10-07 ENCOUNTER — Other Ambulatory Visit: Payer: Self-pay | Admitting: Physician Assistant

## 2021-10-09 ENCOUNTER — Ambulatory Visit: Payer: Medicare HMO | Admitting: Family Medicine

## 2021-10-11 ENCOUNTER — Other Ambulatory Visit: Payer: Self-pay | Admitting: Family Medicine

## 2021-10-11 DIAGNOSIS — G2581 Restless legs syndrome: Secondary | ICD-10-CM

## 2021-10-12 ENCOUNTER — Encounter: Payer: Self-pay | Admitting: Physician Assistant

## 2021-10-12 DIAGNOSIS — I700 Atherosclerosis of aorta: Secondary | ICD-10-CM | POA: Insufficient documentation

## 2021-10-12 HISTORY — DX: Atherosclerosis of aorta: I70.0

## 2021-10-12 NOTE — Progress Notes (Signed)
?Cardiology Office Note:   ? ?Date:  10/13/2021  ? ?ID:  Heather [name], DOB February 24, 1935, MRN [id number] ? ?PCP:  Mosie Lukes, MD  ?[facility] Providers ?Cardiologist:  [name], MD ?Cardiology APP:  Liliane Shi, PA-C    ?Referring MD: Mosie Lukes, MD  ? ?Chief Complaint:  F/u for CAD, CHF ?  ? ?Patient Profile: ?Coronary artery disease  ?Jaw pain=anginal equiv ?S/p Taxus DES to LCx 9/06 ?S/p Taxus DES to RCA 12/06 ?Residual dz 12/06: mLAD 70-80 ?Aortic atherosclerosis  ?(HFpEF) heart failure with preserved ejection fraction  ?Borderline diabetes mellitus  ?GERD  ?Hypertension  ?Hyperlipidemia  ?Hx of CVA ?Hx of post op DVT (after TKR) in 2016; Rivaroxaban x 6 mos ?Fatigue, palpitations 3/22 ?Echocardiogram 2/22: EF 55-60 ?Myoview 2/22: EF 67, low risk  ?Event monitor 3/22: no sig arrhythmia  ?Rx w/ beta-blocker  ?  ?Prior CV studies: ?Echocardiogram 01/29/2021 ?EF 60-65, no RWMA, normal RVSF, normal PASP, RVSP 31.1, trivial MR, AV sclerosis without stenosis ? ?NON-TELEMETRY MONITORING HOOKUP AND INTERP 10/05/2020 ?NSR, avg HR 61, no AF/Flutter ?Nocturnal bradycardia; no high grade HB ?Rare PVCs, rare SV beats; no sustained arrhythmia ? ?Myoview 08/31/20 ?No ischemia or scar, EF 67; low risk  ?  ?Echocardiogram 08/26/20 ?EF 55-60, no RWMA, mild conc LVH, Gr 2 DD, GLS -21.7%, normal RVSF, trivial MR ?  ?Carotid US 11/17/15 ?Bilat ICA 1-39 ?  ?ETT Echocardiogram 10/24/11 ?Normal  ?  ?Myoview 08/03/09 ?EF 70, no scar or ischemia ?  ?Cardiac catheterization 06/27/05 ?LM 20 ?LAD mid 70-80; Dx ost 77 ?OM stent patent ?RCA mid 70-80 ?EF 55 ?PCI:  Taxus DES to RCA ?  ?Myoview 04/25/05 ?No ischemia or scar, EF 70 ? ? ?History of Present Illness:   ?Heather [name] is a 87 y.o. female with the above problem list.  The pt had multiple admissions in 2022 for bladder sling, lap cholecystectomy and repair of incarcerated incisional hernia, decompensated HFpEF, sepsis due to cellulitis from surgical wound, small bowel  obstruction.  She was last seen by Dr. Burt Knack in Dec 2022.   ? ?She returns for f/u.  She is here with her daughter.  She is overall feeling better. She brings in a list of BPs.  Her BP is usually 140s-150s.  However, most of her BP readings are taken prior to medication.  She has a couple of episodes recorded where she felt lightheaded and her BP was under 132 systolic.  She has not had chest pain, significant shortness of breath, syncope.  She takes Lasix as needed for leg edema.  She takes it for 2-3 days and then does not have to take it again for several days.   ? ?Also, her R clavicle is more prominent and she is concerned about this.  She has a significant kyphotic curve and I suspect this is related.  She also notes a rash on her legs.  She has some chronic changes from swelling as well as some erythematous lesions.  She has been using cortisone cream with some improvement.  She also notes difficulty with sleep.  ?   ?Past Medical History:  ?Diagnosis Date  ? Aortic atherosclerosis (Heather [name]) 10/12/2021  ? Chronic stable angina ([facility])   ? Coronary artery disease cardiologist--- dr cooper  ? hx NSTEMI  w/ cardiac cath 03-16-2005 PCI with DES to LCx;   cardiac cath 06-27-2005  PCI with DES to RCA with residual dx LAD manage medcially;  lexiscan 01-26-20211 normal no  ischemia, ef 70%;  stress echo w/ dobutamine 10-24-2011 negative ishcmeia , normal ef // Myoview 2/22: EF 67, no ischemia or infarction, no TID, low risk   ? Diabetes mellitus type 2, diet-controlled ([place])   ? followed by pcp  (10-19-2020  pt stated checks daily in am,  fasting blood surgar--- 115--120s)  ? DOE (dyspnea on exertion)   ? per pt "when I over do",  ok with household chores  ? Echocardiogram 08/2020   ? Echocardiogram 2/22: EF 55-60, no RWMA, mild LVH, Gr 2 DD, GLS-21.7%, normal RVSF, trivial MR, RVSP 39.5  ? Edema of right lower extremity   ? GERD (gastroesophageal reflux disease)   ? Hiatal hernia   ? recurrence,  hx HH repair 1989  ? History  of cervical cancer   ? s/p  vaginal hysterectomy  ? History of DVT of lower extremity 2016  ? 11-29-2014 post op right TKA of right lower extremity and completed xarelto   ? History of esophageal stricture   ? hx s/p dilatation's  ? History of gastric ulcer 2005 approx.  ? History of palpitations 2010  ? event monitor 07-07-2009 showed NSR w/ freq. SVT ectopies with short runs, rare PVCs  ? History of TIA (transient ischemic attack) 06/1999  ? 12-15-2019  per pt had several TIA between 12/ 2000 to 02/ 2001 , was sent to specialist '@[facility]' , had test that was normal (10-19-2020 pt stated no TIAs since ) but has residual of essential tremors of right arm/ hand  ? Hypertension   ? Intermittent palpitations   ? IT band syndrome   ? Migraine   ? "ice pick headche lasts about 30 seconds"  ? Mixed hyperlipidemia   ? Mixed incontinence urge and stress   ? Multiple thyroid nodules   ? followed by pcp---   ultrasound 11-22-2014 no bx   (12-15-2019 per pt had a endocrinologist and was told did not need bx)  ? OA (osteoarthritis)   ? knees, elbow, hip, ankles  ? Occasional tremors   ? right arm/ hand  s/p TIA residual 2000  ? Osteoporosis   ? taking vitamin d  ? Peroneal DVT (deep venous thrombosis) ([place]) 12/22/2014  ? Right bundle branch block (RBBB) with left anterior fascicular block (LAFB)   ? RLS (restless legs syndrome)   ? S/P drug eluting coronary stent placement 2006  ? 03-16-2005  PCI x1 DES to LCx;   06-27-2005  PCI x1 DES to RCA  ? Urinary retention   ? post op sling prodecure on 10-27-2020, has foley cathether  ? ?Current Medications: ?Current Meds  ?Medication Sig  ? acetaminophen (TYLENOL) 500 MG tablet Take 1,000 mg by mouth every 6 (six) hours as needed for moderate pain.  ? amLODipine (NORVASC) 10 MG tablet TAKE 1 TABLET EVERY DAY  ? Ascorbic Acid (VITAMIN C) 500 MG CHEW Chew 500 mg by mouth daily.  ? aspirin EC 81 MG tablet Take 1 tablet (81 mg total) by mouth daily.  ? Blood Glucose Monitoring Suppl (TRUE METRIX  AIR GLUCOSE METER) w/Device KIT USE TO CHECK BLOOD SUGAR ONCE DAILY AND AS NEEDED.  DX CODE E11.9  ? Cholecalciferol (VITAMIN D) 50 MCG (2000 UT) CAPS Take 2,000 Units by mouth daily.   ? diclofenac Sodium (VOLTAREN) 1 % GEL Apply 1 application topically 4 (four) times daily as needed (hip pain).  ? dicyclomine (BENTYL) 20 MG tablet Take 1 tablet (20 mg total) by mouth 2 (two) times daily as  needed for spasms (abdominal cramping).  ? estradiol (ESTRACE) 0.1 MG/GM vaginal cream Place 0.5 g vaginally 2 (two) times a week. Place 0.5g nightly for two weeks then twice a week after  ? famotidine (PEPCID) 20 MG tablet Take 20 mg by mouth at bedtime as needed for heartburn or indigestion.  ? furosemide (LASIX) 20 MG tablet Take 1 tablet (20 mg total) by mouth daily.  ? glucose blood (TRUE METRIX BLOOD GLUCOSE TEST) test strip TEST BLOOD SUGAR EVERY DAY  OR AS NEEDED  ? isosorbide mononitrate (IMDUR) 60 MG 24 hr tablet TAKE 1 AND 1/2 TABLETS EVERY DAY  ? losartan (COZAAR) 100 MG tablet Take 0.5 tablets (50 mg total) by mouth 2 (two) times daily.  ? magnesium oxide (MAG-OX) 400 MG tablet Take 400 mg by mouth at bedtime.  ? metoprolol succinate (TOPROL-XL) 25 MG 24 hr tablet Take 0.5 tablets (12.5 mg total) by mouth daily.  ? omeprazole (PRILOSEC) 20 MG capsule TAKE 1 CAPSULE EVERY DAY AS NEEDED (NEED OFFICE VISIT BEFORE ANY FURTHER REFILLS)  ? ondansetron (ZOFRAN-ODT) 4 MG disintegrating tablet Take 4 mg by mouth every 8 (eight) hours as needed for nausea or vomiting.  ? polyethylene glycol (MIRALAX / GLYCOLAX) 17 g packet Take 17 g by mouth daily as needed for mild constipation.  ? pramipexole (MIRAPEX) 0.25 MG tablet TAKE 1 TABLET TWICE DAILY  ? simvastatin (ZOCOR) 20 MG tablet TAKE 1 TABLET AT BEDTIME  ? traMADol (ULTRAM) 50 MG tablet Take 1 tablet (50 mg total) by mouth every 6 (six) hours as needed for severe pain.  ? TRUEplus Lancets 33G MISC USE TO CHECK BLOOD SUGAR ONCE DAILY AND AS NEEDED.  DX CODE: E11.9  ? Vibegron  75 MG TABS Take 75 mg by mouth daily.  ? Zinc 50 MG TABS Take 50 mg by mouth daily.  ?  ?Allergies:   Baclofen and Nitrofurantoin  ? ?Social History  ? ?Tobacco Use  ? Smoking status: Never  ? Smoke

## 2021-10-13 ENCOUNTER — Encounter: Payer: Self-pay | Admitting: Physician Assistant

## 2021-10-13 ENCOUNTER — Ambulatory Visit: Payer: Medicare HMO | Admitting: Physician Assistant

## 2021-10-13 VITALS — BP 142/60 | HR 74 | Ht 61.0 in | Wt 152.6 lb

## 2021-10-13 DIAGNOSIS — E1169 Type 2 diabetes mellitus with other specified complication: Secondary | ICD-10-CM

## 2021-10-13 DIAGNOSIS — I2510 Atherosclerotic heart disease of native coronary artery without angina pectoris: Secondary | ICD-10-CM | POA: Diagnosis not present

## 2021-10-13 DIAGNOSIS — I10 Essential (primary) hypertension: Secondary | ICD-10-CM

## 2021-10-13 DIAGNOSIS — E785 Hyperlipidemia, unspecified: Secondary | ICD-10-CM

## 2021-10-13 DIAGNOSIS — I5032 Chronic diastolic (congestive) heart failure: Secondary | ICD-10-CM

## 2021-10-13 DIAGNOSIS — I700 Atherosclerosis of aorta: Secondary | ICD-10-CM

## 2021-10-13 NOTE — Assessment & Plan Note (Signed)
Blood pressure somewhat elevated today.  Given her age, I think blood pressure less than sign 140/90 would be optimal.  Most of her readings at home are 140s-150s.  However, these are prior to taking medications.  She has had some low readings with symptoms of lightheadedness.  I have asked her to check her blood pressure after medications over the next couple of weeks.  Adjust medications if needed.  Continue losartan 50 mg twice daily, amlodipine 10 mg daily, isosorbide 90 mg daily.  Given her difficulties with sleep, I have asked her to try taking metoprolol succinate 12.5 mg in the morning. ?

## 2021-10-13 NOTE — Assessment & Plan Note (Signed)
History of DES to the LCx and RCA in 2006.  Myoview in February 2022 was low risk.  She is doing well without anginal symptoms.  Continue aspirin 81 mg daily, simvastatin 20 mg daily. ?

## 2021-10-13 NOTE — Assessment & Plan Note (Signed)
LDL in January 2022 was 63.  ALT in March 2023 was normal.  Continue simvastatin 20 mg daily ?

## 2021-10-13 NOTE — Assessment & Plan Note (Signed)
Continue aspirin, statin therapy 

## 2021-10-13 NOTE — Assessment & Plan Note (Signed)
Echo in July 2022 with normal EF.  Volume status is currently stable.  She is NYHA II.  She only takes furosemide as needed.  Creatinine and potassium were normal in March 2023.  Continue current therapy.  Follow-up 6 months. ?

## 2021-10-13 NOTE — Patient Instructions (Signed)
Medication Instructions:  ?Your physician recommends that you continue on your current medications as directed. Please refer to the Current Medication list given to you today. ?You can change the Toprol to taking in the morning ? ?*If you need a refill on your cardiac medications before your next appointment, please call your pharmacy* ? ? ?Lab Work: ?None ordered ? ?If you have labs (blood work) drawn today and your tests are completely normal, you will receive your results only by: ?MyChart Message (if you have MyChart) OR ?A paper copy in the mail ?If you have any lab test that is abnormal or we need to change your treatment, we will call you to review the results. ? ? ?Testing/Procedures: ?None ordered ? ? ?Follow-Up: ?At [facility], you and your health needs are our priority.  As part of our continuing mission to provide you with exceptional heart care, we have created designated Provider Care Teams.  These Care Teams include your primary Cardiologist (physician) and Advanced Practice Providers (APPs -  Physician Assistants and Nurse Practitioners) who all work together to provide you with the care you need, when you need it. ? ?We recommend signing up for the patient portal called "MyChart".  Sign up information is provided on this After Visit Summary.  MyChart is used to connect with patients for Virtual Visits (Telemedicine).  Patients are able to view lab/test results, encounter notes, upcoming appointments, etc.  Non-urgent messages can be sent to your provider as well.   ?To learn more about what you can do with MyChart, go to [email or web address].   ? ?Your next appointment:   ?6 month(s) ? ?The format for your next appointment:   ?In Person ? ?Provider:   ?[name], MD   ? ? ?Other Instructions ? our physician has requested that you regularly monitor and record your blood pressure readings at home. Please use the same machine at the same time of day to check your readings and record them to  bring to your follow-up visit. ?  ?Please monitor blood pressures and keep a log of your readings and send in some readings in 2 weeks either via Mychart or you can call us.   ?  ?Make sure to check 2 hours after your medications.  ?  ?AVOID these things for 30 minutes before checking your blood pressure: ?No Drinking caffeine. ?No Drinking alcohol. ?No Eating. ?No Smoking. ?No Exercising. ?  ?Five minutes before checking your blood pressure: ?Pee. ?Sit in a dining chair. Avoid sitting in a soft couch or armchair. ?Be quiet. Do not talk  ?

## 2021-10-19 NOTE — Progress Notes (Signed)
? ?Subjective:  ? ? Patient ID: Heather [name], female    DOB: [birth year], 87 y.o.   MRN: [id number] ? ?Chief Complaint  ?Patient presents with  ? Follow-up  ? ? ?HPI ?Patient is in today for a follow up. Overall she is doing well. No recent febrile illness or hospitalizations. No complaints of polydipsia but she does note urinary frequency. No dysuria or hematuria. She also notes some dyspepsia at times.Denies CP/palp/SOB/HA/congestion/fevers. Taking meds as prescribed  ? ?Past Medical History:  ?Diagnosis Date  ? Aortic atherosclerosis ([place]) 10/12/2021  ? Chronic stable angina (HCC)   ? Coronary artery disease cardiologist--- dr cooper  ? hx NSTEMI  w/ cardiac cath 03-16-2005 PCI with DES to LCx;   cardiac cath 06-27-2005  PCI with DES to RCA with residual dx LAD manage medcially;  lexiscan 01-26-20211 normal no ischemia, ef 70%;  stress echo w/ dobutamine 10-24-2011 negative ishcmeia , normal ef // Myoview 2/22: EF 67, no ischemia or infarction, no TID, low risk   ? Diabetes mellitus type 2, diet-controlled ([place])   ? followed by pcp  (10-19-2020  pt stated checks daily in am,  fasting blood surgar--- 115--120s)  ? DOE (dyspnea on exertion)   ? per pt "when I over do",  ok with household chores  ? Echocardiogram 08/2020   ? Echocardiogram 2/22: EF 55-60, no RWMA, mild LVH, Gr 2 DD, GLS-21.7%, normal RVSF, trivial MR, RVSP 39.5  ? Edema of right lower extremity   ? GERD (gastroesophageal reflux disease)   ? Hiatal hernia   ? recurrence,  hx HH repair 1989  ? History of cervical cancer   ? s/p  vaginal hysterectomy  ? History of DVT of lower extremity 2016  ? 11-29-2014 post op right TKA of right lower extremity and completed xarelto   ? History of esophageal stricture   ? hx s/p dilatation's  ? History of gastric ulcer 2005 approx.  ? History of palpitations 2010  ? event monitor 07-07-2009 showed NSR w/ freq. SVT ectopies with short runs, rare PVCs  ? History of TIA (transient ischemic attack) 06/1999  ? 12-15-2019   per pt had several TIA between 12/ 2000 to 02/ 2001 , was sent to specialist [facility] , had test that was normal (10-19-2020 pt stated no TIAs since ) but has residual of essential tremors of right arm/ hand  ? Hypertension   ? Intermittent palpitations   ? IT band syndrome   ? Migraine   ? "ice pick headche lasts about 30 seconds"  ? Mixed hyperlipidemia   ? Mixed incontinence urge and stress   ? Multiple thyroid nodules   ? followed by pcp---   ultrasound 11-22-2014 no bx   (12-15-2019 per pt had a endocrinologist and was told did not need bx)  ? OA (osteoarthritis)   ? knees, elbow, hip, ankles  ? Occasional tremors   ? right arm/ hand  s/p TIA residual 2000  ? Osteoporosis   ? taking vitamin d  ? Peroneal DVT (deep venous thrombosis) ([place]) 12/22/2014  ? Right bundle branch block (RBBB) with left anterior fascicular block (LAFB)   ? RLS (restless legs syndrome)   ? S/P drug eluting coronary stent placement 2006  ? 03-16-2005  PCI x1 DES to LCx;   06-27-2005  PCI x1 DES to RCA  ? Urinary retention   ? post op sling prodecure on 10-27-2020, has foley cathether  ? ? ?Past Surgical History:  ?Procedure Laterality Date  ?  ANTERIOR AND POSTERIOR REPAIR N/A 12/22/2019  ? Procedure: ANTERIOR (CYSTOCELE)  REPAIR;  Surgeon: Janyth Contes, MD;  Location: [place];  Service: Gynecology;  Laterality: N/A;  ? ANTERIOR AND POSTERIOR REPAIR WITH SACROSPINOUS FIXATION N/A 10/27/2020  ? Procedure: SACROSPINOUS LIGAMENT FIXATION;  Surgeon: Jaquita Folds, MD;  Location: [facility];  Service: Gynecology;  Laterality: N/A;  ? BLADDER SUSPENSION N/A 10/27/2020  ? Procedure: TRANSVAGINAL TAPE (TVT) PROCEDURE;  Surgeon: Jaquita Folds, MD;  Location: [facility];  Service: Gynecology;  Laterality: N/A;  ? CATARACT EXTRACTION W/ INTRAOCULAR LENS  IMPLANT, BILATERAL  2015  ? CHOLECYSTECTOMY N/A 01/25/2021  ? Procedure: LAPAROSCOPIC CHOLECYSTECTOMY WITH INTRAOPERATIVE  CHOLANGIOGRAM AND LYSIS OF ADHESIONS;  Surgeon: Michael Boston, MD;  Location: [facility];  Service: General;  Laterality: N/A;  ? COLONOSCOPY  last one ?  ? CORONARY ANGIOPLASTY WITH STENT PLACEMENT  03-16-2005   dr Lia Foyer  ? PCI and DES x1 to LCx  ? CORONARY ANGIOPLASTY WITH STENT PLACEMENT  06-27-2005  dr Lia Foyer  ? PCI and DES x1 to RCA with residual disease LAD 70-80% to manage medically  ? CYSTOSCOPY N/A 10/27/2020  ? Procedure: CYSTOSCOPY;  Surgeon: Jaquita Folds, MD;  Location: [facility];  Service: Gynecology;  Laterality: N/A;  ? CYSTOSCOPY N/A 11/09/2020  ? Procedure: CYSTOSCOPY;  Surgeon: Jaquita Folds, MD;  Location: [facility];  Service: Gynecology;  Laterality: N/A;  ? FOOT SURGERY Left 1990s  ? left foot stress fracture repair, per pt no hardware  ? HIATAL HERNIA REPAIR  1989  ? INCISIONAL HERNIA REPAIR N/A 01/25/2021  ? Procedure: PRIMARY REPAIR OF INCISIONAL HERNIA;  Surgeon: Michael Boston, MD;  [facility];  Service: General;  Laterality: N/A;  ? INGUINAL HERNIA REPAIR Left 04/17/2021  ? Procedure: LAPAROSCOPIC LEFT INGUINAL HERNIA REPAIR WITH MESH;  Surgeon: Ralene Ok, MD;  [facility];  Service: General;  Laterality: Left;  ? KNEE ARTHROSCOPY Bilateral right ?/   left x2 , last one 09-12-2009 @ [place]  ? PUBOVAGINAL SLING N/A 11/09/2020  ? Procedure: REVISION OF PUBO-VAGINAL SLING;  Surgeon: Jaquita Folds, MD;  Location: [facility];  Service: Gynecology;  Laterality: N/A;  ? RECTOCELE REPAIR N/A 04/20/2020  ? Procedure: POSTERIOR REPAIR (RECTOCELE);  Surgeon: Janyth Contes, MD;  Location: [facility];  Service: Gynecology;  Laterality: N/A;  ? RECTOCELE REPAIR N/A 10/27/2020  ? Procedure: POSTERIOR REPAIR (RECTOCELE);  Surgeon: Jaquita Folds, MD;  Location: [facility];  Service: Gynecology;  Laterality: N/A;  total time requested for all procedures is 2 hours  ? TOTAL KNEE  ARTHROPLASTY  11/12/2011  ? Procedure: TOTAL KNEE ARTHROPLASTY;  Surgeon: Lorn Junes, MD;  Location: [place];  Service: Orthopedics;  Laterality: Left;  Dr Noemi Chapel wants 90 minutes for this case  ? TOTAL KNEE ARTHROPLASTY Right 11/29/2014  ? Procedure: RIGHT TOTAL KNEE ARTHROPLASTY;  Surgeon: Vickey Huger, MD;  Location: [place];  Service: Orthopedics;  Laterality: Right;  ? UPPER GASTROINTESTINAL ENDOSCOPY  last one 04-25-2017  ? with dilatation esophageal stricture and savary dilatation  ? VAGINAL HYSTERECTOMY  1988  ?  no ovaries removed for bleeeding  ? ? ?Family History  ?Problem Relation Age of Onset  ? Stroke Father   ?     family hx of M 1st degree relative <50  ? Coronary artery disease Mother   ? Heart disease Mother   ? Depression  Brother   ? Stroke Brother   ? Diabetes Brother   ? Cancer Brother   ?     bladder with mets  ? Diabetes Daughter   ?     borderline  ? Hypertension Daughter   ? Arthritis Other   ?     family hx of  ? Hypertension Other   ?     family hx of  ? Other Other   ?     family hx of cardiovascular disorder  ? Thyroid disease Daughter   ? Breast cancer Neg Hx   ? Colon cancer Neg Hx   ? Anesthesia problems Neg Hx   ? Hypotension Neg Hx   ? Malignant hyperthermia Neg Hx   ? Pseudochol deficiency Neg Hx   ? Colon polyps Neg Hx   ? Esophageal cancer Neg Hx   ? Rectal cancer Neg Hx   ? Stomach cancer Neg Hx   ? ? ?Social History  ? ?Socioeconomic History  ? Marital status: Widowed  ?  Spouse name: Not on file  ? Number of children: 3  ? Years of education: 17  ? Highest education level: Not on file  ?Occupational History  ? Occupation: works partime in office  ?  Employer: RETIRED  ?  Comment: retired  ?Tobacco Use  ? Smoking status: Never  ? Smokeless tobacco: Never  ?Vaping Use  ? Vaping Use: Never used  ?Substance and Sexual Activity  ? Alcohol use: No  ?  Alcohol/week: 0.0 standard drinks  ? Drug use: Never  ? Sexual activity: Not Currently  ?  Birth control/protection: Surgical  ?   Comment: lives alone  ?Other Topics Concern  ? Not on file  ?Social History Narrative  ? Lives with husband, Caffeine use- Half Caffeine)- 2 cups daily.  3 children living, one passed away.   Education: HS. Business [employer]

## 2021-10-20 ENCOUNTER — Ambulatory Visit (INDEPENDENT_AMBULATORY_CARE_PROVIDER_SITE_OTHER): Payer: Medicare HMO | Admitting: Family Medicine

## 2021-10-20 ENCOUNTER — Encounter: Payer: Self-pay | Admitting: Family Medicine

## 2021-10-20 VITALS — BP 138/70 | HR 63 | Resp 20 | Ht 61.0 in | Wt 152.4 lb

## 2021-10-20 DIAGNOSIS — N289 Disorder of kidney and ureter, unspecified: Secondary | ICD-10-CM

## 2021-10-20 DIAGNOSIS — M25551 Pain in right hip: Secondary | ICD-10-CM

## 2021-10-20 DIAGNOSIS — K449 Diaphragmatic hernia without obstruction or gangrene: Secondary | ICD-10-CM

## 2021-10-20 DIAGNOSIS — I10 Essential (primary) hypertension: Secondary | ICD-10-CM | POA: Diagnosis not present

## 2021-10-20 DIAGNOSIS — E1169 Type 2 diabetes mellitus with other specified complication: Secondary | ICD-10-CM | POA: Diagnosis not present

## 2021-10-20 DIAGNOSIS — E538 Deficiency of other specified B group vitamins: Secondary | ICD-10-CM

## 2021-10-20 DIAGNOSIS — E785 Hyperlipidemia, unspecified: Secondary | ICD-10-CM | POA: Diagnosis not present

## 2021-10-20 DIAGNOSIS — E1121 Type 2 diabetes mellitus with diabetic nephropathy: Secondary | ICD-10-CM

## 2021-10-20 DIAGNOSIS — K219 Gastro-esophageal reflux disease without esophagitis: Secondary | ICD-10-CM | POA: Diagnosis not present

## 2021-10-20 DIAGNOSIS — Z87440 Personal history of urinary (tract) infections: Secondary | ICD-10-CM | POA: Diagnosis not present

## 2021-10-20 LAB — LIPID PANEL
Cholesterol: 145 mg/dL (ref 0–200)
HDL: 62.6 mg/dL (ref >39.00)
LDL Cholesterol: 60 mg/dL (ref 0–99)
NonHDL: 81.91
Total CHOL/HDL Ratio: 2
Triglycerides: 112 mg/dL (ref 0.0–149.0)
VLDL: 22.4 mg/dL (ref 0.0–40.0)

## 2021-10-20 LAB — CBC WITH DIFFERENTIAL/PLATELET
Basophils Absolute: 0 10*3/uL (ref 0.0–0.1)
Basophils Relative: 0.6 % (ref 0.0–3.0)
Eosinophils Absolute: 0.2 10*3/uL (ref 0.0–0.7)
Eosinophils Relative: 2.8 % (ref 0.0–5.0)
HCT: 38.6 % (ref 36.0–46.0)
Hemoglobin: 12.9 g/dL (ref 12.0–15.0)
Lymphocytes Relative: 23 % (ref 12.0–46.0)
Lymphs Abs: 1.6 10*3/uL (ref 0.7–4.0)
MCHC: 33.4 g/dL (ref 30.0–36.0)
MCV: 89.1 fl (ref 78.0–100.0)
Monocytes Absolute: 0.6 10*3/uL (ref 0.1–1.0)
Monocytes Relative: 8.4 % (ref 3.0–12.0)
Neutro Abs: 4.4 10*3/uL (ref 1.4–7.7)
Neutrophils Relative %: 65.2 % (ref 43.0–77.0)
Platelets: 172 10*3/uL (ref 150.0–400.0)
RBC: 4.33 Mil/uL (ref 3.87–5.11)
RDW: 13.6 % (ref 11.5–15.5)
WBC: 6.8 10*3/uL (ref 4.0–10.5)

## 2021-10-20 LAB — COMPREHENSIVE METABOLIC PANEL
ALT: 19 U/L (ref 0–35)
AST: 23 U/L (ref 0–37)
Albumin: 4.4 g/dL (ref 3.5–5.2)
Alkaline Phosphatase: 120 U/L — ABNORMAL HIGH (ref 39–117)
BUN: 25 mg/dL — ABNORMAL HIGH (ref 6–23)
CO2: 30 mEq/L (ref 19–32)
Calcium: 9.4 mg/dL (ref 8.4–10.5)
Chloride: 99 mEq/L (ref 96–112)
Creatinine, Ser: 1.03 mg/dL (ref 0.40–1.20)
GFR: 49.75 mL/min — ABNORMAL LOW (ref >60.00)
Glucose, Bld: 122 mg/dL — ABNORMAL HIGH (ref 70–99)
Potassium: 3.9 mEq/L (ref 3.5–5.1)
Sodium: 136 mEq/L (ref 135–145)
Total Bilirubin: 0.6 mg/dL (ref 0.2–1.2)
Total Protein: 6.3 g/dL (ref 6.0–8.3)

## 2021-10-20 LAB — TSH: TSH: 1.95 u[IU]/mL (ref 0.35–5.50)

## 2021-10-20 LAB — MICROALBUMIN / CREATININE URINE RATIO
Creatinine,U: 24.3 mg/dL
Microalb Creat Ratio: 2.9 mg/g (ref 0.0–30.0)
Microalb, Ur: <0.7 mg/dL (ref 0.0–1.9)

## 2021-10-20 LAB — VITAMIN D 25 HYDROXY (VIT D DEFICIENCY, FRACTURES): VITD: 25.57 ng/mL — ABNORMAL LOW (ref 30.00–100.00)

## 2021-10-20 LAB — VITAMIN B12: Vitamin B-12: 345 pg/mL (ref 211–911)

## 2021-10-20 LAB — HEMOGLOBIN A1C: Hgb A1c MFr Bld: 7.1 % — ABNORMAL HIGH (ref 4.6–6.5)

## 2021-10-20 MED ORDER — TRAMADOL HCL 50 MG PO TABS: 50.0000 mg | ORAL_TABLET | Freq: Four times a day (QID) | ORAL | 0 refills | Status: DC | PRN

## 2021-10-20 MED ORDER — FAMOTIDINE 20 MG PO TABS: 20.0000 mg | ORAL_TABLET | Freq: Every evening | ORAL | 1 refills | Status: DC | PRN

## 2021-10-20 NOTE — Patient Instructions (Signed)
Chronic Knee Pain, Adult Chronic knee pain is pain in one or both knees that lasts longer than 3 months. Symptoms of chronic knee pain may include swelling, stiffness, and discomfort. Age-related wear and tear (osteoarthritis) of the knee joint is the most common cause of chronic knee pain. Other possible causes include: A long-term immune-related disease that causes inflammation of the knee (rheumatoid arthritis). This usually affects both knees. Inflammatory arthritis, such as gout or pseudogout. An injury to the knee that causes arthritis. An injury to the knee that damages the ligaments. Ligaments are strong tissues that connect bones to each other. Runner's knee or pain behind the kneecap. Treatment for chronic knee pain depends on the cause. The main treatments for chronic knee pain are physical therapy and weight loss. This condition may also be treated with medicines, injections, a knee sleeve or brace, and by using crutches. Rest, ice, pressure (compression), and elevation, also known as RICE therapy, may also be recommended. Follow these instructions at home: If you have a knee sleeve or brace:  Wear the knee sleeve or brace as told by your health care provider. Remove it only as told by your health care provider. Loosen it if your toes tingle, become numb, or turn cold and blue. Keep it clean. If the sleeve or brace is not waterproof: Do not let it get wet. Remove it if allowed by your health care provider, or cover it with a watertight covering when you take a bath or a shower. Managing pain, stiffness, and swelling     If directed, apply heat to the affected area as often as told by your health care provider. Use the heat source that your health care provider recommends, such as a moist heat pack or a heating pad. If you have a removable knee sleeve or brace, remove it as told by your health care provider. Place a towel between your skin and the heat source. Leave the heat on for  20-30 minutes. Remove the heat if your skin turns bright red. This is especially important if you are unable to feel pain, heat, or cold. You may have a greater risk of getting burned. If directed, put ice on the affected area. To do this: If you have a removable knee sleeve or brace, remove it as told by your health care provider. Put ice in a plastic bag. Place a towel between your skin and the bag. Leave the ice on for 20 minutes, 2-3 times a day. Remove the ice if your skin turns bright red. This is very important. If you cannot feel pain, heat, or cold, you have a greater risk of damage to the area. Move your toes often to reduce stiffness and swelling. Raise (elevate) the injured area above the level of your heart while you are sitting or lying down. Activity Avoid high-impact activities or exercises, such as running, jumping rope, or doing jumping jacks. Follow the exercise plan that your health care provider designed for you. Your health care provider may suggest that you: Avoid activities that make knee pain worse. This may require you to change your exercise routines, sport participation, or job duties. Wear shoes with cushioned soles. Avoid sports that require running and sudden changes in direction. Do physical therapy. Physical therapy is planned to match your needs and abilities. It may include exercises for strength, flexibility, stability, and endurance. Do exercises that increase balance and strength, such as tai chi and yoga. Do not use the injured limb to support your   body weight until your health care provider says that you can. Use crutches as told by your health care provider. Return to your normal activities as told by your health care provider. Ask your health care provider what activities are safe for you. General instructions Take over-the-counter and prescription medicines only as told by your health care provider. Lose weight if you are overweight. Losing even a  little weight can reduce knee pain. Ask your health care provider what your ideal weight is, and how to safely lose extra weight. A dietitian may be able to help you plan your meals. Do not use any products that contain nicotine or tobacco, such as cigarettes, e-cigarettes, and chewing tobacco. These can delay healing. If you need help quitting, ask your health care provider. Keep all follow-up visits. This is important. Contact a health care provider if: You have knee pain that is not getting better or gets worse. You are unable to do your physical therapy exercises due to knee pain. Get help right away if: Your knee swells and the swelling becomes worse. You cannot move your knee. You have severe knee pain. Summary Knee pain that lasts more than 3 months is considered chronic knee pain. The main treatments for chronic knee pain are physical therapy and weight loss. You may also need to take medicines, wear a knee sleeve or brace, use crutches, and apply ice or heat. Losing even a little weight can reduce knee pain. Ask your health care provider what your ideal weight is, and how to safely lose extra weight. A dietitian may be able to help you plan your meals. Follow the exercise plan that your health care provider designed for you. This information is not intended to replace advice given to you by your health care provider. Make sure you discuss any questions you have with your health care provider. Document Revised: 12/09/2019 Document Reviewed: 12/09/2019 Elsevier Patient Education  2023 Elsevier Inc.  

## 2021-10-22 DIAGNOSIS — E538 Deficiency of other specified B group vitamins: Secondary | ICD-10-CM | POA: Insufficient documentation

## 2021-10-22 NOTE — Assessment & Plan Note (Signed)
Avoid offending foods, start probiotics. Do not eat large meals in late evening and consider raising head of bed. Pepcid qhs ?

## 2021-10-22 NOTE — Assessment & Plan Note (Signed)
hgba1c acceptable, minimize simple carbs. Increase exercise as tolerated. Continue current meds 

## 2021-10-22 NOTE — Assessment & Plan Note (Signed)
Hydrate and monitor 

## 2021-10-22 NOTE — Assessment & Plan Note (Signed)
Supplement and monitor, check vitamin d ?

## 2021-10-22 NOTE — Assessment & Plan Note (Signed)
Supplement and monitor 

## 2021-10-22 NOTE — Assessment & Plan Note (Signed)
Well controlled, no changes to meds. Encouraged heart healthy diet such as the DASH diet and exercise as tolerated.  °

## 2021-10-22 NOTE — Assessment & Plan Note (Signed)
Encourage heart healthy diet such as MIND or DASH diet, increase exercise, avoid trans fats, simple carbohydrates and processed foods, consider a krill or fish or flaxseed oil cap daily.  °

## 2021-10-23 ENCOUNTER — Encounter: Payer: Self-pay | Admitting: Obstetrics and Gynecology

## 2021-11-08 ENCOUNTER — Ambulatory Visit: Payer: Self-pay

## 2021-11-08 ENCOUNTER — Ambulatory Visit: Payer: Medicare HMO | Admitting: Family Medicine

## 2021-11-08 VITALS — BP 150/60 | Ht 61.0 in | Wt 148.0 lb

## 2021-11-08 DIAGNOSIS — M25551 Pain in right hip: Secondary | ICD-10-CM

## 2021-11-08 DIAGNOSIS — M5416 Radiculopathy, lumbar region: Secondary | ICD-10-CM | POA: Diagnosis not present

## 2021-11-08 MED ORDER — METHYLPREDNISOLONE ACETATE 40 MG/ML IJ SUSP
40.0000 mg | Freq: Once | INTRAMUSCULAR | Status: AC
  Administered 2021-11-08: 40 mg via INTRAMUSCULAR

## 2021-11-08 NOTE — Progress Notes (Signed)
?Heather [name] - 87 y.o. female MRN [id number]  Date of birth: 1935-03-23 ? ?SUBJECTIVE:  Including CC & ROS.  ?No chief complaint on file. ? ? ?Heather [name] is a 87 y.o. female that is presenting with acute worsening of her right hip and leg pain.  The pain is got severe over the past few weeks.  She is having to walk with a cane. ? ? ?Review of Systems ?See HPI  ? ?HISTORY: Past Medical, Surgical, Social, and Family History Reviewed & Updated per EMR.   ?Pertinent Historical Findings include: ? ?Past Medical History:  ?Diagnosis Date  ? Aortic atherosclerosis ([place]) 10/12/2021  ? Chronic stable angina (HCC)   ? Coronary artery disease cardiologist--- dr cooper  ? hx NSTEMI  w/ cardiac cath 03-16-2005 PCI with DES to LCx;   cardiac cath 06-27-2005  PCI with DES to RCA with residual dx LAD manage medcially;  lexiscan 01-26-20211 normal no ischemia, ef 70%;  stress echo w/ dobutamine 10-24-2011 negative ishcmeia , normal ef // Myoview 2/22: EF 67, no ischemia or infarction, no TID, low risk   ? Diabetes mellitus type 2, diet-controlled ([place])   ? followed by pcp  (10-19-2020  pt stated checks daily in am,  fasting blood surgar--- 115--120s)  ? DOE (dyspnea on exertion)   ? per pt "when I over do",  ok with household chores  ? Echocardiogram 08/2020   ? Echocardiogram 2/22: EF 55-60, no RWMA, mild LVH, Gr 2 DD, GLS-21.7%, normal RVSF, trivial MR, RVSP 39.5  ? Edema of right lower extremity   ? GERD (gastroesophageal reflux disease)   ? Hiatal hernia   ? recurrence,  hx HH repair 1989  ? History of cervical cancer   ? s/p  vaginal hysterectomy  ? History of DVT of lower extremity 2016  ? 11-29-2014 post op right TKA of right lower extremity and completed xarelto   ? History of esophageal stricture   ? hx s/p dilatation's  ? History of gastric ulcer 2005 approx.  ? History of palpitations 2010  ? event monitor 07-07-2009 showed NSR w/ freq. SVT ectopies with short runs, rare PVCs  ? History of TIA (transient ischemic  attack) 06/1999  ? 12-15-2019  per pt had several TIA between 12/ 2000 to 02/ 2001 , was sent to specialist [facility] , had test that was normal (10-19-2020 pt stated no TIAs since ) but has residual of essential tremors of right arm/ hand  ? Hypertension   ? Intermittent palpitations   ? IT band syndrome   ? Migraine   ? "ice pick headche lasts about 30 seconds"  ? Mixed hyperlipidemia   ? Mixed incontinence urge and stress   ? Multiple thyroid nodules   ? followed by pcp---   ultrasound 11-22-2014 no bx   (12-15-2019 per pt had a endocrinologist and was told did not need bx)  ? OA (osteoarthritis)   ? knees, elbow, hip, ankles  ? Occasional tremors   ? right arm/ hand  s/p TIA residual 2000  ? Osteoporosis   ? taking vitamin d  ? Peroneal DVT (deep venous thrombosis) ([place]) 12/22/2014  ? Right bundle branch block (RBBB) with left anterior fascicular block (LAFB)   ? RLS (restless legs syndrome)   ? S/P drug eluting coronary stent placement 2006  ? 03-16-2005  PCI x1 DES to LCx;   06-27-2005  PCI x1 DES to RCA  ? Urinary retention   ? post op sling prodecure on  10-27-2020, has foley cathether  ? ? ?Past Surgical History:  ?Procedure Laterality Date  ? ANTERIOR AND POSTERIOR REPAIR N/A 12/22/2019  ? Procedure: ANTERIOR (CYSTOCELE)  REPAIR;  Surgeon: Janyth Contes, MD;  Location: [place];  Service: Gynecology;  Laterality: N/A;  ? ANTERIOR AND POSTERIOR REPAIR WITH SACROSPINOUS FIXATION N/A 10/27/2020  ? Procedure: SACROSPINOUS LIGAMENT FIXATION;  Surgeon: Jaquita Folds, MD;  Location: [facility];  Service: Gynecology;  Laterality: N/A;  ? BLADDER SUSPENSION N/A 10/27/2020  ? Procedure: TRANSVAGINAL TAPE (TVT) PROCEDURE;  Surgeon: Jaquita Folds, MD;  Location: [facility];  Service: Gynecology;  Laterality: N/A;  ? CATARACT EXTRACTION W/ INTRAOCULAR LENS  IMPLANT, BILATERAL  2015  ? CHOLECYSTECTOMY N/A 01/25/2021  ? Procedure: LAPAROSCOPIC CHOLECYSTECTOMY  WITH INTRAOPERATIVE CHOLANGIOGRAM AND LYSIS OF ADHESIONS;  Surgeon: Michael Boston, MD;  [facility];  Service: General;  Laterality: N/A;  ? COLONOSCOPY  last one ?  ? CORONARY ANGIOPLASTY WITH STENT PLACEMENT  03-16-2005   dr Lia Foyer  ? PCI and DES x1 to LCx  ? CORONARY ANGIOPLASTY WITH STENT PLACEMENT  06-27-2005  dr Lia Foyer  ? PCI and DES x1 to RCA with residual disease LAD 70-80% to manage medically  ? CYSTOSCOPY N/A 10/27/2020  ? Procedure: CYSTOSCOPY;  Surgeon: Jaquita Folds, MD;  Location: [facility];  Service: Gynecology;  Laterality: N/A;  ? CYSTOSCOPY N/A 11/09/2020  ? Procedure: CYSTOSCOPY;  Surgeon: Jaquita Folds, MD;  Location: [facility];  Service: Gynecology;  Laterality: N/A;  ? FOOT SURGERY Left 1990s  ? left foot stress fracture repair, per pt no hardware  ? HIATAL HERNIA REPAIR  1989  ? INCISIONAL HERNIA REPAIR N/A 01/25/2021  ? Procedure: PRIMARY REPAIR OF INCISIONAL HERNIA;  Surgeon: Michael Boston, MD;  [facility];  Service: General;  Laterality: N/A;  ? INGUINAL HERNIA REPAIR Left 04/17/2021  ? Procedure: LAPAROSCOPIC LEFT INGUINAL HERNIA REPAIR WITH MESH;  Surgeon: Ralene Ok, MD;  [facility];  Service: General;  Laterality: Left;  ? KNEE ARTHROSCOPY Bilateral right ?/   left x2 , last one 09-12-2009 @ [place]  ? PUBOVAGINAL SLING N/A 11/09/2020  ? Procedure: REVISION OF PUBO-VAGINAL SLING;  Surgeon: Jaquita Folds, MD;  Location: [facility];  Service: Gynecology;  Laterality: N/A;  ? RECTOCELE REPAIR N/A 04/20/2020  ? Procedure: POSTERIOR REPAIR (RECTOCELE);  Surgeon: Janyth Contes, MD;  [facility];  Service: Gynecology;  Laterality: N/A;  ? RECTOCELE REPAIR N/A 10/27/2020  ? Procedure: POSTERIOR REPAIR (RECTOCELE);  Surgeon: Jaquita Folds, MD;  Location: [facility];  Service: Gynecology;  Laterality: N/A;  total time requested for all procedures is 2  hours  ? TOTAL KNEE ARTHROPLASTY  11/12/2011  ? Procedure: TOTAL KNEE ARTHROPLASTY;  Surgeon: Lorn Junes, MD;  Location: [place];  Service: Orthopedics;  Laterality: Left;  Dr Noemi Chapel wants 90 minutes for this case  ? TOTAL KNEE ARTHROPLASTY Right 11/29/2014  ? Procedure: RIGHT TOTAL KNEE ARTHROPLASTY;  Surgeon: Vickey Huger, MD;  Location: [place];  Service: Orthopedics;  Laterality: Right;  ? UPPER GASTROINTESTINAL ENDOSCOPY  last one 04-25-2017  ? with dilatation esophageal stricture and savary dilatation  ? VAGINAL HYSTERECTOMY  1988  ?  no ovaries removed for bleeeding  ? ? ? ?PHYSICAL EXAM:  ?VS: BP (!) 150/60   Ht '5\' 1"'$  (1.549 m)   Wt 148 lb (67.1 kg)   LMP  (LMP  Unknown)   BMI 27.96 kg/m?  ?Physical Exam ?Gen: NAD, alert, cooperative with exam, well-appearing ?MSK:  ?Neurovascularly intact   ? ? ?Aspiration/Injection Procedure Note ?[name] ?1934/07/19 ? ?Procedure: Injection ?Indications: Right hip pain ? ?Procedure Details ?Consent: Risks of procedure as well as the alternatives and risks of each were explained to the (patient/caregiver).  Consent for procedure obtained. ?Time Out: Verified patient identification, verified procedure, site/side was marked, verified correct patient position, special equipment/implants available, medications/allergies/relevent history reviewed, required imaging and test results available.  Performed.  The area was cleaned with iodine and alcohol swabs.   ? ?The right trochanteric bursa was injected using 1 cc's of 40 mg Depo-Medrol and 4 cc's of 0.25% bupivacaine with a 22 3 1/2" needle.  Ultrasound was used. Images were obtained in short views showing the injection.   ? ? ?A sterile dressing was applied. ? ?Patient did tolerate procedure well. ? ? ? ? ?ASSESSMENT & PLAN:  ? ?Lumbar radiculopathy ?Acute on chronic in nature.  Previous MRI did show areas of impingement with her scoliosis ?-Counseled on home exercise therapy and supportive care. ?-If no improvement with  the bursa injection then can pursue an epidural. ? ?Greater trochanteric pain syndrome of right lower extremity ?Acute on chronic in nature.  Bursitis was observed on the MRI from 2021. ?-Counseled on home exer

## 2021-11-08 NOTE — Assessment & Plan Note (Signed)
Acute on chronic in nature.  Bursitis was observed on the MRI from 2021. ?-Counseled on home exercise therapy and supportive care. ?-Injection today. ?-Can pursue shockwave therapy. ?

## 2021-11-08 NOTE — Patient Instructions (Signed)
Good to see you ?Please try ice  ?We can consider performing shockwave therapy on the hip.  ?If the pain isn't gone by Thursday morning, then call me. We can consider an epidural.   ?Please send me a message in MyChart with any questions or updates.  ?Please see me back in 4 weeks.  ? ?--Dr. Raeford Razor ? ?

## 2021-11-08 NOTE — Assessment & Plan Note (Signed)
Acute on chronic in nature.  Previous MRI did show areas of impingement with her scoliosis ?-Counseled on home exercise therapy and supportive care. ?-If no improvement with the bursa injection then can pursue an epidural. ?

## 2021-11-28 ENCOUNTER — Ambulatory Visit (INDEPENDENT_AMBULATORY_CARE_PROVIDER_SITE_OTHER): Payer: Medicare HMO

## 2021-11-28 DIAGNOSIS — Z0000 Encounter for general adult medical examination without abnormal findings: Secondary | ICD-10-CM

## 2021-11-28 NOTE — Progress Notes (Cosign Needed Addendum)
Virtual Visit via Telephone Note  I connected with  Heather [name] on 11/28/21 at  2:00 PM EDT by telephone and verified that I am speaking with the correct person using two identifiers.  Medicare Annual Wellness visit completed telephonically due to Covid-19 pandemic.   Persons participating in this call: This Health Coach and this patient.   Location: Patient: Home Provider: Office    I discussed the limitations, risks, security and privacy concerns of performing an evaluation and management service by telephone and the availability of in person appointments. The patient expressed understanding and agreed to proceed.  Unable to perform video visit due to video visit attempted and failed and/or patient does not have video capability.   Some vital signs may be absent or patient reported.   Willette Brace, LPN   Subjective:   Heather [name] is a 87 y.o. female who presents for Medicare Annual (Subsequent) preventive examination.  Review of Systems     Cardiac Risk Factors include: advanced age (>3mn, >>21women);diabetes mellitus;hypertension;dyslipidemia;sedentary lifestyle     Objective:    There were no vitals filed for this visit. There is no height or weight on file to calculate BMI.     11/28/2021    2:09 PM 09/11/2021    4:58 PM 04/17/2021    9:39 AM 04/16/2021    8:00 AM 04/15/2021   12:47 PM 04/14/2021    8:41 PM 02/05/2021    1:00 PM  Advanced Directives  Does Patient Have a Medical Advance Directive? Yes Yes No Yes Yes Yes Yes  Type of AIndustrial/product designerof ATequ[place]Living will  HEast [place]Living will [place]on[place]Living will [place]Living will [place] Does patient want to make changes to medical advance directive?    No - Patient declined   No - Patient declined  Copy of HMahnomenin Chart? Yes - validated most recent copy  scanned in chart (See row information)   No - copy requested  No - copy requested Yes - validated most recent copy scanned in chart (See row information)  Would patient like information on creating a medical advance directive?   No - Patient declined        Current Medications (verified) Outpatient Encounter Medications as of 11/28/2021  Medication Sig   acetaminophen (TYLENOL) 500 MG tablet Take 1,000 mg by mouth every 6 (six) hours as needed for moderate pain.   amLODipine (NORVASC) 10 MG tablet TAKE 1 TABLET EVERY DAY   Ascorbic Acid (VITAMIN C) 500 MG CHEW Chew 500 mg by mouth daily.   aspirin EC 81 MG tablet Take 1 tablet (81 mg total) by mouth daily.   Blood Glucose Monitoring Suppl (TRUE METRIX AIR GLUCOSE METER) w/Device KIT USE TO CHECK BLOOD SUGAR ONCE DAILY AND AS NEEDED.  DX CODE E11.9   Cholecalciferol (VITAMIN D) 50 MCG (2000 UT) CAPS Take 2,000 Units by mouth daily.    diclofenac Sodium (VOLTAREN) 1 % GEL Apply 1 application topically 4 (four) times daily as needed (hip pain).   estradiol (ESTRACE) 0.1 MG/GM vaginal cream Place 0.5 g vaginally 2 (two) times a week. Place 0.5g nightly for two weeks then twice a week after   famotidine (PEPCID) 20 MG tablet Take 1 tablet (20 mg total) by mouth at bedtime as needed for heartburn or indigestion.   furosemide (LASIX) 20 MG tablet Take 1 tablet (20 mg total)  by mouth daily.   glucose blood (TRUE METRIX BLOOD GLUCOSE TEST) test strip TEST BLOOD SUGAR EVERY DAY  OR AS NEEDED   isosorbide mononitrate (IMDUR) 60 MG 24 hr tablet TAKE 1 AND 1/2 TABLETS EVERY DAY   losartan (COZAAR) 100 MG tablet Take 0.5 tablets (50 mg total) by mouth 2 (two) times daily.   magnesium oxide (MAG-OX) 400 MG tablet Take 400 mg by mouth at bedtime.   metoprolol succinate (TOPROL-XL) 25 MG 24 hr tablet Take 0.5 tablets (12.5 mg total) by mouth daily.   omeprazole (PRILOSEC) 20 MG capsule TAKE 1 CAPSULE EVERY DAY AS NEEDED (NEED OFFICE VISIT BEFORE ANY FURTHER  REFILLS)   ondansetron (ZOFRAN-ODT) 4 MG disintegrating tablet Take 4 mg by mouth every 8 (eight) hours as needed for nausea or vomiting.   polyethylene glycol (MIRALAX / GLYCOLAX) 17 g packet Take 17 g by mouth daily as needed for mild constipation.   pramipexole (MIRAPEX) 0.25 MG tablet TAKE 1 TABLET TWICE DAILY   simvastatin (ZOCOR) 20 MG tablet TAKE 1 TABLET AT BEDTIME   traMADol (ULTRAM) 50 MG tablet Take 1 tablet (50 mg total) by mouth every 6 (six) hours as needed for severe pain.   TRUEplus Lancets 33G MISC USE TO CHECK BLOOD SUGAR ONCE DAILY AND AS NEEDED.  DX CODE: E11.9   Vibegron 75 MG TABS Take 75 mg by mouth daily.   Zinc 50 MG TABS Take 50 mg by mouth daily.   dicyclomine (BENTYL) 20 MG tablet Take 1 tablet (20 mg total) by mouth 2 (two) times daily as needed for spasms (abdominal cramping). (Patient not taking: Reported on 11/28/2021)   nitroGLYCERIN (NITROSTAT) 0.4 MG SL tablet Place 1 tablet (0.4 mg total) under the tongue every 5 (five) minutes as needed for chest pain.   No facility-administered encounter medications on file as of 11/28/2021.    Allergies (verified) Baclofen and Nitrofurantoin   History: Past Medical History:  Diagnosis Date   Aortic atherosclerosis ([place]) 10/12/2021   Chronic stable angina ([place])    Coronary artery disease cardiologist--- dr Burt Knack   hx NSTEMI  w/ cardiac cath 03-16-2005 PCI with DES to LCx;   cardiac cath 06-27-2005  PCI with DES to RCA with residual dx LAD manage medcially;  lexiscan 01-26-20211 normal no ischemia, ef 70%;  stress echo w/ dobutamine 10-24-2011 negative ishcmeia , normal ef // Myoview 2/22: EF 67, no ischemia or infarction, no TID, low risk    Diabetes mellitus type 2, diet-controlled ([place])    followed by pcp  (10-19-2020  pt stated checks daily in am,  fasting blood surgar--- 115--120s)   DOE (dyspnea on exertion)    per pt "when I over do",  ok with household chores   Echocardiogram 08/2020    Echocardiogram 2/22: EF  55-60, no RWMA, mild LVH, Gr 2 DD, GLS-21.7%, normal RVSF, trivial MR, RVSP 39.5   Edema of right lower extremity    GERD (gastroesophageal reflux disease)    Hiatal hernia    recurrence,  hx HH repair 1989   History of cervical cancer    s/p  vaginal hysterectomy   History of DVT of lower extremity 2016   11-29-2014 post op right TKA of right lower extremity and completed xarelto    History of esophageal stricture    hx s/p dilatation's   History of gastric ulcer 2005 approx.   History of palpitations 2010   event monitor 07-07-2009 showed NSR w/ freq. SVT ectopies with short runs,  rare PVCs   History of TIA (transient ischemic attack) 06/1999   12-15-2019  per pt had several TIA between 12/ 2000 to 02/ 2001 , was sent to specialist '@[facility]' , had test that was normal (10-19-2020 pt stated no TIAs since ) but has residual of essential tremors of right arm/ hand   Hypertension    Intermittent palpitations    IT band syndrome    Migraine    "ice pick headche lasts about 30 seconds"   Mixed hyperlipidemia    Mixed incontinence urge and stress    Multiple thyroid nodules    followed by pcp---   ultrasound 11-22-2014 no bx   (12-15-2019 per pt had a endocrinologist and was told did not need bx)   OA (osteoarthritis)    knees, elbow, hip, ankles   Occasional tremors    right arm/ hand  s/p TIA residual 2000   Osteoporosis    taking vitamin d   Peroneal DVT (deep venous thrombosis) ([place]) 12/22/2014   Right bundle branch block (RBBB) with left anterior fascicular block (LAFB)    RLS (restless legs syndrome)    S/P drug eluting coronary stent placement 2006   03-16-2005  PCI x1 DES to LCx;   06-27-2005  PCI x1 DES to RCA   Urinary retention    post op sling prodecure on 10-27-2020, has foley cathether   Past Surgical History:  Procedure Laterality Date   ANTERIOR AND POSTERIOR REPAIR N/A 12/22/2019   Procedure: ANTERIOR (CYSTOCELE)  REPAIR;  Surgeon: Janyth Contes, MD;  Location:  [place];  Service: Gynecology;  Laterality: N/A;   ANTERIOR AND POSTERIOR REPAIR WITH SACROSPINOUS FIXATION N/A 10/27/2020   Procedure: SACROSPINOUS LIGAMENT FIXATION;  Surgeon: Jaquita Folds, MD;  Location: [facility];  Service: Gynecology;  Laterality: N/A;   BLADDER SUSPENSION N/A 10/27/2020   Procedure: TRANSVAGINAL TAPE (TVT) PROCEDURE;  Surgeon: Jaquita Folds, MD;  Location[facility];  Service: Gynecology;  Laterality: N/A;   CATARACT EXTRACTION W/ INTRAOCULAR LENS  IMPLANT, BILATERAL  2015   CHOLECYSTECTOMY N/A 01/25/2021   Procedure: LAPAROSCOPIC CHOLECYSTECTOMY WITH INTRAOPERATIVE CHOLANGIOGRAM AND LYSIS OF ADHESIONS;  Surgeon: Michael Boston, MD;  Location: [facility];  Service: General;  Laterality: N/A;   COLONOSCOPY  last one ?   CORONARY ANGIOPLASTY WITH STENT PLACEMENT  03-16-2005   dr Lia Foyer   PCI and DES x1 to LCx   CORONARY ANGIOPLASTY WITH STENT PLACEMENT  06-27-2005  dr Lia Foyer   PCI and DES x1 to RCA with residual disease LAD 70-80% to manage medically   CYSTOSCOPY N/A 10/27/2020   Procedure: CYSTOSCOPY;  Surgeon: Jaquita Folds, MD;  Location: [facility];  Service: Gynecology;  Laterality: N/A;   CYSTOSCOPY N/A 11/09/2020   Procedure: CYSTOSCOPY;  Surgeon: Jaquita Folds, MD;  Location: [facility];  Service: Gynecology;  Laterality: N/A;   FOOT SURGERY Left 1990s   left foot stress fracture repair, per pt no hardware   HIATAL HERNIA REPAIR  1989   INCISIONAL HERNIA REPAIR N/A 01/25/2021   Procedure: PRIMARY REPAIR OF INCISIONAL HERNIA;  Surgeon: Michael Boston, MD;  Location: [facility];  Service: General;  Laterality: N/A;   INGUINAL HERNIA REPAIR Left 04/17/2021   Procedure: LAPAROSCOPIC LEFT INGUINAL HERNIA REPAIR WITH MESH;  Surgeon: Ralene Ok, MD;  Location: [facility];  Service: General;  Laterality: Left;   KNEE ARTHROSCOPY Bilateral right ?/   left x2 , last one  09-12-2009 [place]   PUBOVAGINAL SLING N/A 11/09/2020   Procedure: REVISION OF PUBO-VAGINAL SLING;  Surgeon: Jaquita Folds, MD;  Location: [facility];  Service: Gynecology;  Laterality: N/A;   RECTOCELE REPAIR N/A 04/20/2020   Procedure: POSTERIOR REPAIR (RECTOCELE);  Surgeon: Janyth Contes, MD;  Location: [facility];  Service: Gynecology;  Laterality: N/A;   RECTOCELE REPAIR N/A 10/27/2020   Procedure: POSTERIOR REPAIR (RECTOCELE);  Surgeon: Jaquita Folds, MD;  Location: [facility];  Service: Gynecology;  Laterality: N/A;  total time requested for all procedures is 2 hours   TOTAL KNEE ARTHROPLASTY  11/12/2011   Procedure: TOTAL KNEE ARTHROPLASTY;  Surgeon: Lorn Junes, MD;  Location: [place];  Service: Orthopedics;  Laterality: Left;  Dr Noemi Chapel wants 90 minutes for this case   TOTAL KNEE ARTHROPLASTY Right 11/29/2014   Procedure: RIGHT TOTAL KNEE ARTHROPLASTY;  Surgeon: Vickey Huger, MD;  Location: [place];  Service: Orthopedics;  Laterality: Right;   UPPER GASTROINTESTINAL ENDOSCOPY  last one 04-25-2017   with dilatation esophageal stricture and savary dilatation   VAGINAL HYSTERECTOMY  1988    no ovaries removed for bleeeding   Family History  Problem Relation Age of Onset   Stroke Father        family hx of M 1st degree relative <50   Coronary artery disease Mother    Heart disease Mother    Depression Brother    Stroke Brother    Diabetes Brother    Cancer Brother        bladder with mets   Diabetes Daughter        borderline   Hypertension Daughter    Arthritis Other        family hx of   Hypertension Other        family hx of   Other Other        family hx of cardiovascular disorder   Thyroid disease Daughter    Breast cancer Neg Hx    Colon cancer Neg Hx    Anesthesia problems Neg Hx    Hypotension Neg Hx    Malignant hyperthermia Neg Hx    Pseudochol deficiency Neg Hx    Colon polyps Neg Hx     Esophageal cancer Neg Hx    Rectal cancer Neg Hx    Stomach cancer Neg Hx    Social History   Socioeconomic History   Marital status: Widowed    Spouse name: Not on file   Number of children: 3   Years of education: 58   Highest education level: Not on file  Occupational History   Occupation: works partime in Office manager: RETIRED    Comment: retired  Tobacco Use   Smoking status: Never   Smokeless tobacco: Never  Vaping Use   Vaping Use: Never used  Substance and Sexual Activity   Alcohol use: No    Alcohol/week: 0.0 standard drinks   Drug use: Never   Sexual activity: Not Currently    Birth control/protection: Surgical    Comment: lives alone  Other Topics Concern   Not on file  Social History Narrative   Lives with husband, Caffeine use- Half Caffeine)- 2 cups daily.  3 children living, one passed away.   Education: HS. Business college.  Retired.    Social Determinants of Health   Financial Resource Strain: Low Risk    Difficulty of Paying Living Expenses: Not hard at all  Food Insecurity:  No Food Insecurity   Worried About Charity fundraiser in the Last Year: Never true   Ran Out of Food in the Last Year: Never true  Transportation Needs: No Transportation Needs   Lack of Transportation (Medical): No   Lack of Transportation (Non-Medical): No  Physical Activity: Inactive   Days of Exercise per Week: 0 days   Minutes of Exercise per Session: 0 min  Stress: No Stress Concern Present   Feeling of Stress : Not at all  Social Connections: Moderately Isolated   Frequency of Communication with Friends and Family: More than three times a week   Frequency of Social Gatherings with Friends and Family: Once a week   Attends Religious Services: 1 to 4 times per year   Active Member of [employer] or Organizations: No   Attends Archivist Meetings: Never   Marital Status: Widowed    Tobacco Counseling Counseling given: Not Answered   Clinical  Intake:  Pre-visit preparation completed: Yes  Pain : No/denies pain     BMI - recorded: 27.98 Nutritional Risks: None Diabetes: Yes CBG done?: No Did pt. bring in CBG monitor from home?: No  How often do you need to have someone help you when you read instructions, pamphlets, or other written materials from your doctor or pharmacy?: 1 - Never  Diabetic?Nutrition Risk Assessment:  Has the patient had any N/V/D within the last 2 months?  No  Does the patient have any non-healing wounds?  No  Has the patient had any unintentional weight loss or weight gain?  No   Diabetes:  Is the patient diabetic?  Yes  If diabetic, was a CBG obtained today?  No  Did the patient bring in their glucometer from home?  No  How often do you monitor your CBG's? As needed .   Financial Strains and Diabetes Management:  Are you having any financial strains with the device, your supplies or your medication? No .  Does the patient want to be seen by Chronic Care Management for management of their diabetes?  No  Would the patient like to be referred to a Nutritionist or for Diabetic Management?  No   Diabetic Exams:  Diabetic Eye Exam: Completed 09/26/21 Diabetic Foot Exam: Overdue, Pt has been advised about the importance in completing this exam. Pt is scheduled for diabetic foot exam on next appt .   Interpreter Needed?: No  Information entered by :: Charlott Rakes, LPN   Activities of Daily Living    11/28/2021    2:11 PM 04/16/2021    8:00 AM  In your present state of health, do you have any difficulty performing the following activities:  Hearing? 1 0  Vision? 0 0  Difficulty concentrating or making decisions? 0 0  Walking or climbing stairs? 1 0  Comment avoid stairs   Dressing or bathing? 0 0  Doing errands, shopping? 0 1  Preparing Food and eating ? N   Using the Toilet? N   In the past six months, have you accidently leaked urine? Y   Do you have problems with loss of bowel  control? N   Managing your Medications? N   Managing your Finances? N   Housekeeping or managing your Housekeeping? N     Patient Care Team: Mosie Lukes, MD as PCP - General (Family Medicine) [name], MD as PCP - Cardiology (Cardiology) Idelle Leech, [place] (Optometry) Janyth Contes, MD as Consulting Physician (Obstetrics and Gynecology) Celesta Gentile  R, DPM as Consulting Physician (Podiatry) Sharmon Revere as Physician Assistant (Cardiology) [name], RPH-CPP (Pharmacist) Michael Boston, MD as Consulting Physician (General Surgery) Jaquita Folds, MD as Consulting Physician (Gynecology) Armbruster, Carlota Raspberry, MD as Consulting Physician (Gastroenterology)  Indicate any recent Medical Services you may have received from other than [facility] providers in the past year (date may be approximate).     Assessment:   This is a routine wellness examination for [place].  Hearing/Vision screen Hearing Screening - Comments:: Pt stated slight HOH Vision Screening - Comments:: Pt  follows up with [facility]  for annual eye exams   Dietary issues and exercise activities discussed: Current Exercise Habits: The patient does not participate in regular exercise at present   Goals Addressed             This Visit's Progress    Patient Stated       Get more energy        Depression Screen    11/28/2021    2:06 PM 10/20/2021   11:07 AM 07/28/2019   10:24 AM 07/21/2018    9:54 AM 06/18/2016   10:30 AM 12/22/2014    7:15 AM 11/17/2013    2:09 PM  PHQ 2/9 Scores  PHQ - 2 Score 0 0 1 1 0 2 0  PHQ- 9 Score  6    8     Fall Risk    11/28/2021    2:11 PM 10/20/2021   11:06 AM 07/28/2019   10:23 AM 07/21/2018    9:54 AM 02/05/2018    4:30 PM  [place] in the past year? 0 0 0 1 Yes  Number falls in past yr: 0 0 0 0 1  Injury with Fall? 0 0 0 1   Risk for fall due to : Impaired vision No Fall Risks     Follow up Falls prevention discussed Falls  evaluation completed Education provided;Falls prevention discussed      FALL RISK PREVENTION PERTAINING TO THE HOME:  Any stairs in or around the home? No  If so, are there any without handrails? No  Home free of loose throw rugs in walkways, pet beds, electrical cords, etc? Yes  Adequate lighting in your home to reduce risk of falls? Yes   ASSISTIVE DEVICES UTILIZED TO PREVENT FALLS:  Life alert? No  Use of a cane, walker or w/c? Yes  Grab bars in the bathroom? Yes  Shower chair or bench in shower? Yes  Elevated toilet seat or a handicapped toilet? No   TIMED UP AND GO:  Was the test performed? No .   Cognitive Function:    06/18/2016   10:31 AM  MMSE - Mini Mental State Exam  Orientation to time 5  Orientation to Place 5  Registration 3  Attention/ Calculation 5  Recall 2  Language- name 2 objects 2  Language- repeat 1  Language- follow 3 step command 3  Language- read & follow direction 1  Write a sentence 1  Copy design 1  Total score 29        11/28/2021    2:12 PM  6CIT Screen  What Year? 0 points  What month? 0 points  What time? 0 points  Count back from 20 0 points  Months in reverse 0 points  Repeat phrase 0 points  Total Score 0 points    Immunizations Immunization History  Administered Date(s) Administered   Fluad Quad(high Dose 65+) 04/18/2021  Influenza Whole 06/08/2005, 06/29/2010   Influenza, High Dose Seasonal PF 04/05/2015, 06/18/2016, 05/27/2017, 05/30/2018   Influenza, Quadrivalent, Recombinant, Inj, Pf 04/22/2019   Influenza,inj,Quad PF,6+ Mos 04/30/2014   Influenza,inj,quad, With Preservative 05/06/2020   Moderna Sars-Covid-2 Vaccination 07/21/2019, 08/18/2019   PFIZER(Purple Top)SARS-COV-2 Vaccination 05/06/2020   Pfizer Covid-19 Vaccine Bivalent Booster 107yr & up 05/03/2021   Pneumococcal Conjugate-13 11/17/2013   Pneumococcal Polysaccharide-23 07/09/2000, 05/27/2017   Tdap 05/24/2015   Zoster Recombinat (Shingrix)  05/03/2021, 07/26/2021    TDAP status: Up to date  Flu Vaccine status: Up to date  Pneumococcal vaccine status: Up to date  Covid-19 vaccine status: Completed vaccines  Qualifies for Shingles Vaccine? Yes   Zostavax completed Yes   Shingrix Completed?: Yes  Screening Tests Health Maintenance  Topic Date Due   FOOT EXAM  06/18/2017   INFLUENZA VACCINE  02/06/2022   HEMOGLOBIN A1C  04/21/2022   OPHTHALMOLOGY EXAM  09/27/2022   TETANUS/TDAP  05/23/2025   Pneumonia Vaccine 87 Years old  Completed   DEXA SCAN  Completed   COVID-19 Vaccine  Completed   Zoster Vaccines- Shingrix  Completed   HPV VACCINES  Aged Out    Health Maintenance  Health Maintenance Due  Topic Date Due   FOOT EXAM  06/18/2017    Colorectal cancer screening: No longer required.   Mammogram status: No longer required due to age.  Bone Density status: Completed 01/16/21. Results reflect: Bone density results: OSTEOPOROSIS. Repeat every as directed years.   Additional Screening:   Vision Screening: Recommended annual ophthalmology exams for early detection of glaucoma and other disorders of the eye. Is the patient up to date with their annual eye exam?  Yes  Who is the provider or what is the name of the office in which the patient attends annual eye exams? Dr GKaty Fitch If pt is not established with a provider, would they like to be referred to a provider to establish care? No .   Dental Screening: Recommended annual dental exams for proper oral hygiene  Community Resource Referral / Chronic Care Management: CRR required this visit?  No   CCM required this visit?  No      Plan:     I have personally reviewed and noted the following in the patient's chart:   Medical and social history Use of alcohol, tobacco or illicit drugs  Current medications and supplements including opioid prescriptions.  Functional ability and status Nutritional status Physical activity Advanced directives List of  other physicians Hospitalizations, surgeries, and ER visits in previous 12 months Vitals Screenings to include cognitive, depression, and falls Referrals and appointments  In addition, I have reviewed and discussed with patient certain preventive protocols, quality metrics, and best practice recommendations. A written personalized care plan for preventive services as well as general preventive health recommendations were provided to patient.     TWillette Brace LPN   58/[id number]/[id number]  Nurse Notes: None

## 2021-11-28 NOTE — Patient Instructions (Addendum)
Ms. Bainter , Thank you for taking time to come for your Medicare Wellness Visit. I appreciate your ongoing commitment to your health goals. Please review the following plan we discussed and let me know if I can assist you in the future.   Screening recommendations/referrals: Colonoscopy: no longer required  Mammogram: no  longer required  Bone Density: Done 01/16/21 repeat as directed by MD  Recommended yearly ophthalmology/optometry visit for glaucoma screening and checkup Recommended yearly dental visit for hygiene and checkup  Vaccinations: Influenza vaccine: Done 04/18/21 repeat every year Pneumococcal vaccine: Up to date Tdap vaccine: Done 05/24/15 repeat every 10 years  Shingles vaccine: Completed   05/03/21 1st dose & 2nd dose 07/26/21 Covid-19:Completed 1/12, 2/9, 05/06/20 & 05/03/21  Advanced directives: Copies in chart   Conditions/risks identified: get more energy   Next appointment: Follow up in one year for your annual wellness visit    Preventive Care 18 Years and Older, Female Preventive care refers to lifestyle choices and visits with your health care provider that can promote health and wellness. What does preventive care include? A yearly physical exam. This is also called an annual well check. Dental exams once or twice a year. Routine eye exams. Ask your health care provider how often you should have your eyes checked. Personal lifestyle choices, including: Daily care of your teeth and gums. Regular physical activity. Eating a healthy diet. Avoiding tobacco and drug use. Limiting alcohol use. Practicing safe sex. Taking low-dose aspirin every day. Taking vitamin and mineral supplements as recommended by your health care provider. What happens during an annual well check? The services and screenings done by your health care provider during your annual well check will depend on your age, overall health, lifestyle risk factors, and family history of  disease. Counseling  Your health care provider may ask you questions about your: Alcohol use. Tobacco use. Drug use. Emotional well-being. Home and relationship well-being. Sexual activity. Eating habits. History of falls. Memory and ability to understand (cognition). Work and work Statistician. Reproductive health. Screening  You may have the following tests or measurements: Height, weight, and BMI. Blood pressure. Lipid and cholesterol levels. These may be checked every 5 years, or more frequently if you are over 86 years old. Skin check. Lung cancer screening. You may have this screening every year starting at age 28 if you have a 30-pack-year history of smoking and currently smoke or have quit within the past 15 years. Fecal occult blood test (FOBT) of the stool. You may have this test every year starting at age 101. Flexible sigmoidoscopy or colonoscopy. You may have a sigmoidoscopy every 5 years or a colonoscopy every 10 years starting at age 91. Hepatitis C blood test. Hepatitis B blood test. Sexually transmitted disease (STD) testing. Diabetes screening. This is done by checking your blood sugar (glucose) after you have not eaten for a while (fasting). You may have this done every 1-3 years. Bone density scan. This is done to screen for osteoporosis. You may have this done starting at age 19. Mammogram. This may be done every 1-2 years. Talk to your health care provider about how often you should have regular mammograms. Talk with your health care provider about your test results, treatment options, and if necessary, the need for more tests. Vaccines  Your health care provider may recommend certain vaccines, such as: Influenza vaccine. This is recommended every year. Tetanus, diphtheria, and acellular pertussis (Tdap, Td) vaccine. You may need a Td booster every 10 years.  Zoster vaccine. You may need this after age 67. Pneumococcal 13-valent conjugate (PCV13) vaccine. One  dose is recommended after age 29. Pneumococcal polysaccharide (PPSV23) vaccine. One dose is recommended after age 59. Talk to your health care provider about which screenings and vaccines you need and how often you need them. This information is not intended to replace advice given to you by your health care provider. Make sure you discuss any questions you have with your health care provider. Document Released: 07/22/2015 Document Revised: 03/14/2016 Document Reviewed: 04/26/2015 Elsevier Interactive Patient Education  2017 [employer] Prevention in the Home Falls can cause injuries. They can happen to people of all ages. There are many things you can do to make your home safe and to help prevent falls. What can I do on the outside of my home? Regularly fix the edges of walkways and driveways and fix any cracks. Remove anything that might make you trip as you walk through a door, such as a raised step or threshold. Trim any bushes or trees on the path to your home. Use bright outdoor lighting. Clear any walking paths of anything that might make someone trip, such as rocks or tools. Regularly check to see if handrails are loose or broken. Make sure that both sides of any steps have handrails. Any raised decks and porches should have guardrails on the edges. Have any leaves, snow, or ice cleared regularly. Use sand or salt on walking paths during winter. Clean up any spills in your garage right away. This includes oil or grease spills. What can I do in the bathroom? Use night lights. Install grab bars by the toilet and in the tub and shower. Do not use towel bars as grab bars. Use non-skid mats or decals in the tub or shower. If you need to sit down in the shower, use a plastic, non-slip stool. Keep the floor dry. Clean up any water that spills on the floor as soon as it happens. Remove soap buildup in the tub or shower regularly. Attach bath mats securely with double-sided  non-slip rug tape. Do not have throw rugs and other things on the floor that can make you trip. What can I do in the bedroom? Use night lights. Make sure that you have a light by your bed that is easy to reach. Do not use any sheets or blankets that are too big for your bed. They should not hang down onto the floor. Have a firm chair that has side arms. You can use this for support while you get dressed. Do not have throw rugs and other things on the floor that can make you trip. What can I do in the kitchen? Clean up any spills right away. Avoid walking on wet floors. Keep items that you use a lot in easy-to-reach places. If you need to reach something above you, use a strong step stool that has a grab bar. Keep electrical cords out of the way. Do not use floor polish or wax that makes floors slippery. If you must use wax, use non-skid floor wax. Do not have throw rugs and other things on the floor that can make you trip. What can I do with my stairs? Do not leave any items on the stairs. Make sure that there are handrails on both sides of the stairs and use them. Fix handrails that are broken or loose. Make sure that handrails are as long as the stairways. Check any carpeting to make sure that  it is firmly attached to the stairs. Fix any carpet that is loose or worn. Avoid having throw rugs at the top or bottom of the stairs. If you do have throw rugs, attach them to the floor with carpet tape. Make sure that you have a light switch at the top of the stairs and the bottom of the stairs. If you do not have them, ask someone to add them for you. What else can I do to help prevent falls? Wear shoes that: Do not have high heels. Have rubber bottoms. Are comfortable and fit you well. Are closed at the toe. Do not wear sandals. If you use a stepladder: Make sure that it is fully opened. Do not climb a closed stepladder. Make sure that both sides of the stepladder are locked into place. Ask  someone to hold it for you, if possible. Clearly mark and make sure that you can see: Any grab bars or handrails. First and last steps. Where the edge of each step is. Use tools that help you move around (mobility aids) if they are needed. These include: Canes. Walkers. Scooters. Crutches. Turn on the lights when you go into a dark area. Replace any light bulbs as soon as they burn out. Set up your furniture so you have a clear path. Avoid moving your furniture around. If any of your floors are uneven, fix them. If there are any pets around you, be aware of where they are. Review your medicines with your doctor. Some medicines can make you feel dizzy. This can increase your chance of falling. Ask your doctor what other things that you can do to help prevent falls. This information is not intended to replace advice given to you by your health care provider. Make sure you discuss any questions you have with your health care provider. Document Released: 04/21/2009 Document Revised: 12/01/2015 Document Reviewed: 07/30/2014 Elsevier Interactive Patient Education  2017 [employer].

## 2021-12-11 DIAGNOSIS — K432 Incisional hernia without obstruction or gangrene: Secondary | ICD-10-CM | POA: Diagnosis not present

## 2021-12-18 ENCOUNTER — Other Ambulatory Visit: Payer: Self-pay | Admitting: Family Medicine

## 2021-12-25 DIAGNOSIS — M25571 Pain in right ankle and joints of right foot: Secondary | ICD-10-CM | POA: Diagnosis not present

## 2021-12-25 DIAGNOSIS — M25551 Pain in right hip: Secondary | ICD-10-CM | POA: Diagnosis not present

## 2021-12-25 DIAGNOSIS — M47896 Other spondylosis, lumbar region: Secondary | ICD-10-CM | POA: Diagnosis not present

## 2022-01-03 DIAGNOSIS — M25551 Pain in right hip: Secondary | ICD-10-CM | POA: Diagnosis not present

## 2022-01-03 DIAGNOSIS — M5416 Radiculopathy, lumbar region: Secondary | ICD-10-CM | POA: Diagnosis not present

## 2022-01-04 ENCOUNTER — Other Ambulatory Visit: Payer: Self-pay | Admitting: Cardiovascular Disease

## 2022-01-04 ENCOUNTER — Telehealth: Payer: Self-pay | Admitting: *Deleted

## 2022-01-04 NOTE — Chronic Care Management (AMB) (Signed)
  Chronic Care Management Note  01/04/2022 Name: KENZINGTON MIELKE MRN: [id number] DOB: [birth year]  Heather [name] is a 87 y.o. year old female who is a primary care patient of Mosie Lukes, MD and is actively engaged with the care management team. I reached out to [name] by phone today to assist with re-scheduling a follow up visit with the Pharmacist  Follow up plan: Unsuccessful telephone outreach attempt made. A HIPAA compliant phone message was left for the patient providing contact information and requesting a return call.   Julian Hy, [facility]  Direct Dial: [phone]

## 2022-01-04 NOTE — Chronic Care Management (AMB) (Signed)
  Chronic Care Management Note  01/04/2022 Name: Heather [name] MRN: [id number] DOB: 03-19-1935  Heather [name] is a 87 y.o. year old female who is a primary care patient of Mosie Lukes, MD and is actively engaged with the care management team. I reached out to [name] by phone today to assist with re-scheduling a follow up visit with the Pharmacist  Follow up plan: Telephone appointment with care management team member scheduled for: 02/01/2022  Julian Hy, [place]  Direct Dial: [phone]

## 2022-01-12 ENCOUNTER — Telehealth: Payer: Self-pay | Admitting: *Deleted

## 2022-01-12 NOTE — Telephone Encounter (Signed)
   Name: Heather [name]  DOB: 1934/08/07  MRN: [id number]  Primary Cardiologist: [name], MD   Preoperative team, please contact this patient and set up a phone call appointment for further preoperative risk assessment. Please obtain consent and complete medication review. Thank you for your help.  I confirm that guidance regarding antiplatelet and oral anticoagulation therapy has been completed and, if necessary, noted below.  Per office protocol, patient may hold aspirin for 7 days prior to procedure.  Please resume aspirin as soon as possible postprocedure, at the discretion of the surgeon.   Lenna Sciara, NP 01/12/2022, 1:58 PM [facility] [address] [place], [place] [address]

## 2022-01-12 NOTE — Telephone Encounter (Signed)
Pt is agreeable to plan of care for tele visit 01/19/22 @ 9:20. Med rec and consent are done.     Patient Consent for Virtual Visit        Heather [name] has provided verbal consent on 01/12/2022 for a virtual visit (video or telephone).   CONSENT FOR VIRTUAL VISIT FOR:  Heather [name]  By participating in this virtual visit I agree to the following:  I hereby voluntarily request, consent and authorize [facility] and its employed or contracted physicians, physician assistants, nurse practitioners or other licensed health care professionals (the Practitioner), to provide me with telemedicine health care services (the "Services") as deemed necessary by the treating Practitioner. I acknowledge and consent to receive the Services by the Practitioner via telemedicine. I understand that the telemedicine visit will involve communicating with the Practitioner through live audiovisual communication technology and the disclosure of certain medical information by electronic transmission. I acknowledge that I have been given the opportunity to request an in-person assessment or other available alternative prior to the telemedicine visit and am voluntarily participating in the telemedicine visit.  I understand that I have the right to withhold or withdraw my consent to the use of telemedicine in the course of my care at any time, without affecting my right to future care or treatment, and that the Practitioner or I may terminate the telemedicine visit at any time. I understand that I have the right to inspect all information obtained and/or recorded in the course of the telemedicine visit and may receive copies of available information for a reasonable fee.  I understand that some of the potential risks of receiving the Services via telemedicine include:  Delay or interruption in medical evaluation due to technological equipment failure or disruption; Information transmitted may not be sufficient (e.g. poor  resolution of images) to allow for appropriate medical decision making by the Practitioner; and/or  In rare instances, security protocols could fail, causing a breach of personal health information.  Furthermore, I acknowledge that it is my responsibility to provide information about my medical history, conditions and care that is complete and accurate to the best of my ability. I acknowledge that Practitioner's advice, recommendations, and/or decision may be based on factors not within their control, such as incomplete or inaccurate data provided by me or distortions of diagnostic images or specimens that may result from electronic transmissions. I understand that the practice of medicine is not an exact science and that Practitioner makes no warranties or guarantees regarding treatment outcomes. I acknowledge that a copy of this consent can be made available to me via my patient portal ([facility]), or I can request a printed copy by calling the office of [facility].    I understand that my insurance will be billed for this visit.   I have read or had this consent read to me. I understand the contents of this consent, which adequately explains the benefits and risks of the Services being provided via telemedicine.  I have been provided ample opportunity to ask questions regarding this consent and the Services and have had my questions answered to my satisfaction. I give my informed consent for the services to be provided through the use of telemedicine in my medical care

## 2022-01-12 NOTE — Telephone Encounter (Signed)
   Pre-operative Risk Assessment    Patient Name: Heather [name]  DOB: [birth year] MRN: [id number]      Request for Surgical Clearance    Procedure:   HERNIA SURGERY  Date of Surgery:  Clearance TBD                                 Surgeon:  DR. Ralene Ok Surgeon's Group or Practice Name:  [facility] Phone number:  [phone] Fax number:  [phone] ATTN: Carlene Coria, CMA   Type of Clearance Requested:   - Medical ; REQUESTING RECOMMENDATIONS FOR HOLDING ASA   Type of Anesthesia:  General    Additional requests/questions:    Jiles Prows   01/12/2022, 1:15 PM

## 2022-01-12 NOTE — Telephone Encounter (Signed)
Pt is agreeable to plan of care for tele visit 01/19/22 @ 9:20. Med rec and consent are done.

## 2022-01-19 ENCOUNTER — Ambulatory Visit (INDEPENDENT_AMBULATORY_CARE_PROVIDER_SITE_OTHER): Payer: Medicare HMO | Admitting: Physician Assistant

## 2022-01-19 ENCOUNTER — Encounter: Payer: Self-pay | Admitting: Physician Assistant

## 2022-01-19 DIAGNOSIS — Z01810 Encounter for preprocedural cardiovascular examination: Secondary | ICD-10-CM

## 2022-01-19 NOTE — Progress Notes (Signed)
Virtual Visit via Telephone Note   Because of Heather [name]'s co-morbid illnesses, she is at least at moderate risk for complications without adequate follow up.  This format is felt to be most appropriate for this patient at this time.  The patient did not have access to video technology/had technical difficulties with video requiring transitioning to audio format only (telephone).  All issues noted in this document were discussed and addressed.  No physical exam could be performed with this format.  Please refer to the patient's chart for her consent to telehealth for [facility].  Evaluation Performed:  Preoperative cardiovascular risk assessment _____________   Date:  01/19/2022   Patient ID:  Heather [name], DOB 12-11-1934, MRN [id number] Patient Location:  Home Provider location:   Office  Primary Care Provider:  Mosie Lukes, MD Primary Cardiologist:  [name], MD  Chief Complaint / Patient Profile   87 y.o. y/o female with a h/o coronary artery disease (status post DES to LCx 9/06, status post DES to RCA 12/06, residual disease 12/06 mid LAD 70 to 80%), aortic atherosclerosis, HFpEF, borderline diabetes mellitus, GERD, hypertension, hyperlipidemia, history of CVA, history of postop DVT (2016, rivaroxaban x6 months), fatigue and palpitations (3/22 with event monitor with no significant arrhythmia) who is pending hernia surgery and presents today for telephonic preoperative cardiovascular risk assessment.  Past Medical History    Past Medical History:  Diagnosis Date   Aortic atherosclerosis ([place]) 10/12/2021   Chronic stable angina ([place])    Coronary artery disease cardiologist--- dr Burt Knack   hx NSTEMI  w/ cardiac cath 03-16-2005 PCI with DES to LCx;   cardiac cath 06-27-2005  PCI with DES to RCA with residual dx LAD manage medcially;  lexiscan 01-26-20211 normal no ischemia, ef 70%;  stress echo w/ dobutamine 10-24-2011 negative ishcmeia , normal ef // Myoview 2/22: EF  67, no ischemia or infarction, no TID, low risk    Diabetes mellitus type 2, diet-controlled ([place])    followed by pcp  (10-19-2020  pt stated checks daily in am,  fasting blood surgar--- 115--120s)   DOE (dyspnea on exertion)    per pt "when I over do",  ok with household chores   Echocardiogram 08/2020    Echocardiogram 2/22: EF 55-60, no RWMA, mild LVH, Gr 2 DD, GLS-21.7%, normal RVSF, trivial MR, RVSP 39.5   Edema of right lower extremity    GERD (gastroesophageal reflux disease)    Hiatal hernia    recurrence,  hx HH repair 1989   History of cervical cancer    s/p  vaginal hysterectomy   History of DVT of lower extremity 2016   11-29-2014 post op right TKA of right lower extremity and completed xarelto    History of esophageal stricture    hx s/p dilatation's   History of gastric ulcer 2005 approx.   History of palpitations 2010   event monitor 07-07-2009 showed NSR w/ freq. SVT ectopies with short runs, rare PVCs   History of TIA (transient ischemic attack) 06/1999   12-15-2019  per pt had several TIA between 12/ 2000 to 02/ 2001 , was sent to specialist [facility] , had test that was normal (10-19-2020 pt stated no TIAs since ) but has residual of essential tremors of right arm/ hand   Hypertension    Intermittent palpitations    IT band syndrome    Migraine    "ice pick headche lasts about 30 seconds"   Mixed hyperlipidemia    Mixed  incontinence urge and stress    Multiple thyroid nodules    followed by pcp---   ultrasound 11-22-2014 no bx   (12-15-2019 per pt had a endocrinologist and was told did not need bx)   OA (osteoarthritis)    knees, elbow, hip, ankles   Occasional tremors    right arm/ hand  s/p TIA residual 2000   Osteoporosis    taking vitamin d   Peroneal DVT (deep venous thrombosis) ([place]) 12/22/2014   Right bundle branch block (RBBB) with left anterior fascicular block (LAFB)    RLS (restless legs syndrome)    S/P drug eluting coronary stent placement 2006    03-16-2005  PCI x1 DES to LCx;   06-27-2005  PCI x1 DES to RCA   Urinary retention    post op sling prodecure on 10-27-2020, has foley cathether   Past Surgical History:  Procedure Laterality Date   ANTERIOR AND POSTERIOR REPAIR N/A 12/22/2019   Procedure: ANTERIOR (CYSTOCELE)  REPAIR;  Surgeon: Janyth Contes, MD;  Location: [place];  Service: Gynecology;  Laterality: N/A;   ANTERIOR AND POSTERIOR REPAIR WITH SACROSPINOUS FIXATION N/A 10/27/2020   Procedure: SACROSPINOUS LIGAMENT FIXATION;  Surgeon: Jaquita Folds, MD;  Location: [facility];  Service: Gynecology;  Laterality: N/A;   BLADDER SUSPENSION N/A 10/27/2020   Procedure: TRANSVAGINAL TAPE (TVT) PROCEDURE;  Surgeon: Jaquita Folds, MD;  Location: [facility];  Service: Gynecology;  Laterality: N/A;   CATARACT EXTRACTION W/ INTRAOCULAR LENS  IMPLANT, BILATERAL  2015   CHOLECYSTECTOMY N/A 01/25/2021   Procedure: LAPAROSCOPIC CHOLECYSTECTOMY WITH INTRAOPERATIVE CHOLANGIOGRAM AND LYSIS OF ADHESIONS;  Surgeon: Michael Boston, MD;  Location: [facility];  Service: General;  Laterality: N/A;   COLONOSCOPY  last one ?   CORONARY ANGIOPLASTY WITH STENT PLACEMENT  03-16-2005   dr Lia Foyer   PCI and DES x1 to LCx   CORONARY ANGIOPLASTY WITH STENT PLACEMENT  06-27-2005  dr Lia Foyer   PCI and DES x1 to RCA with residual disease LAD 70-80% to manage medically   CYSTOSCOPY N/A 10/27/2020   Procedure: CYSTOSCOPY;  Surgeon: Jaquita Folds, MD;  Location: [facility];  Service: Gynecology;  Laterality: N/A;   CYSTOSCOPY N/A 11/09/2020   Procedure: CYSTOSCOPY;  Surgeon: Jaquita Folds, MD;  Location: [facility];  Service: Gynecology;  Laterality: N/A;   FOOT SURGERY Left 1990s   left foot stress fracture repair, per pt no hardware   HIATAL HERNIA REPAIR  1989   INCISIONAL HERNIA REPAIR N/A 01/25/2021   Procedure: PRIMARY REPAIR OF INCISIONAL HERNIA;   Surgeon: Michael Boston, MD;  Location: [facility];  Service: General;  Laterality: N/A;   INGUINAL HERNIA REPAIR Left 04/17/2021   Procedure: LAPAROSCOPIC LEFT INGUINAL HERNIA REPAIR WITH MESH;  Surgeon: Ralene Ok, MD;  Location: [facility];  Service: General;  Laterality: Left;   KNEE ARTHROSCOPY Bilateral right ?/   left x2 , last one 09-12-2009 @ [facility]   PUBOVAGINAL SLING N/A 11/09/2020   Procedure: REVISION OF PUBO-VAGINAL SLING;  Surgeon: Jaquita Folds, MD;  Location: [facility];  Service: Gynecology;  Laterality: N/A;   RECTOCELE REPAIR N/A 04/20/2020   Procedure: POSTERIOR REPAIR (RECTOCELE);  Surgeon: Janyth Contes, MD;  Location: [facility];  Service: Gynecology;  Laterality: N/A;   RECTOCELE REPAIR N/A 10/27/2020   Procedure: POSTERIOR REPAIR (RECTOCELE);  Surgeon: Jaquita Folds, MD;  Location: [facility];  Service: Gynecology;  Laterality:  N/A;  total time requested for all procedures is 2 hours   TOTAL KNEE ARTHROPLASTY  11/12/2011   Procedure: TOTAL KNEE ARTHROPLASTY;  Surgeon: Lorn Junes, MD;  Location: [place];  Service: Orthopedics;  Laterality: Left;  Dr Noemi Chapel wants 90 minutes for this case   TOTAL KNEE ARTHROPLASTY Right 11/29/2014   Procedure: RIGHT TOTAL KNEE ARTHROPLASTY;  Surgeon: Vickey Huger, MD;  Location: [place];  Service: Orthopedics;  Laterality: Right;   UPPER GASTROINTESTINAL ENDOSCOPY  last one 04-25-2017   with dilatation esophageal stricture and savary dilatation   VAGINAL HYSTERECTOMY  1988    no ovaries removed for bleeeding    Allergies  Allergies  Allergen Reactions   Baclofen Other (See Comments)    Hyperactivity    Nitrofurantoin Rash    History of Present Illness    Heather [name] is a 87 y.o. female who presents via audio/video conferencing for a telehealth visit today.  Pt was last seen in cardiology clinic on 10/13/2021 by [name], PA.  At that time Heather [name] was doing  well .  The patient is now pending procedure as outlined above.   Since her last visit, she has been doing pretty good.  She has chronic issues with her hip that she has been having for 3 years.  She has another MRI scheduled.  She cannot walk 1-2 blocks because of this hip.  She also has trouble with stairs.  She does all her own vacuuming and dusting around her house.  She has somebody that helps her with her yard work.  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  She did score of 4.06 METS on the DASI.  This does exceed the 4 METS minimum requirement.  She has comes concerned about her blood pressure this morning.  She states that before her medication her blood pressure was 147/52.  After she took her medication it was 125/52.  She states when it is this low she does not feel well and has to lay down.  She had this happen 1 time, otherwise she has been well controlled.  She has a appointment with our pharmacy staff 02/01/2022.  I suggested that she keep this appointment and if her blood pressure drops again to give her office a call.  We may need to adjust her medications at that point.  We discussed keeping hydrated and adding a little bit of sodium to her diet to increase blood pressure.  Reports no shortness of breath nor dyspnea on exertion. Reports no chest pain, pressure, or tightness. No edema, orthopnea, PND. Reports no palpitations.    We discussed holding her aspirin 7 days prior to her procedure.   Home Medications    Prior to Admission medications   Medication Sig Start Date End Date Taking? Authorizing Provider  acetaminophen (TYLENOL) 500 MG tablet Take 1,000 mg by mouth every 6 (six) hours as needed for moderate pain.    [provider]  amLODipine (NORVASC) 10 MG tablet TAKE 1 TABLET EVERY DAY 09/04/21   Mosie Lukes, MD  Ascorbic Acid (VITAMIN C) 500 MG CHEW Chew 500 mg by mouth daily.    [provider]  aspirin EC  81 MG tablet Take 1 tablet (81 mg total) by mouth daily. 05/04/15   [name], MD  Blood Glucose Monitoring Suppl (TRUE METRIX AIR GLUCOSE METER) w/Device KIT USE TO CHECK BLOOD SUGAR ONCE DAILY  AND AS NEEDED.  DX CODE E11.9 07/27/20   Mosie Lukes, MD  Cholecalciferol (VITAMIN D) 50 MCG (2000 UT) CAPS Take 2,000 Units by mouth daily.     [provider]  diclofenac Sodium (VOLTAREN) 1 % GEL Apply 1 application topically 4 (four) times daily as needed (hip pain).    [provider]  dicyclomine (BENTYL) 20 MG tablet Take 1 tablet (20 mg total) by mouth 2 (two) times daily as needed for spasms (abdominal cramping). Patient not taking: Reported on 11/28/2021 04/15/21   Mesner, Corene Cornea, MD  estradiol (ESTRACE) 0.1 MG/GM vaginal cream Place 0.5 g vaginally 2 (two) times a week. Place 0.5g nightly for two weeks then twice a week after 09/25/21   Jaquita Folds, MD  famotidine (PEPCID) 20 MG tablet Take 1 tablet (20 mg total) by mouth at bedtime as needed for heartburn or indigestion. 10/20/21   Mosie Lukes, MD  furosemide (LASIX) 20 MG tablet Take 1 tablet (20 mg total) by mouth daily. 03/29/21   Supple, Megan E, RPH-CPP  glucose blood (TRUE METRIX BLOOD GLUCOSE TEST) test strip TEST BLOOD SUGAR EVERY DAY  OR AS NEEDED 09/21/21   Mosie Lukes, MD  isosorbide mononitrate (IMDUR) 60 MG 24 hr tablet TAKE 1 AND 1/2 TABLETS EVERY DAY 10/09/21   [name], MD  losartan (COZAAR) 100 MG tablet TAKE 1 TABLET (100 MG TOTAL) BY MOUTH DAILY. 01/04/22   [name], MD  magnesium oxide (MAG-OX) 400 MG tablet Take 400 mg by mouth at bedtime.    [provider]  metoprolol succinate (TOPROL-XL) 25 MG 24 hr tablet Take 0.5 tablets (12.5 mg total) by mouth daily. 05/12/21   [name], MD  nitroGLYCERIN (NITROSTAT) 0.4 MG SL tablet Place 1 tablet (0.4 mg total) under the tongue every 5 (five) minutes as needed for chest pain. 08/29/20 01/12/22  [name] T, PA-C   omeprazole (PRILOSEC) 20 MG capsule TAKE 1 CAPSULE EVERY MORNING 12/18/21   Mosie Lukes, MD  ondansetron (ZOFRAN-ODT) 4 MG disintegrating tablet Take 4 mg by mouth every 8 (eight) hours as needed for nausea or vomiting.    [provider]  polyethylene glycol (MIRALAX / GLYCOLAX) 17 g packet Take 17 g by mouth daily as needed for mild constipation. 04/18/21   Meuth, Brooke A, PA-C  pramipexole (MIRAPEX) 0.25 MG tablet TAKE 1 TABLET TWICE DAILY 10/11/21   Carollee Herter, [name], DO  simvastatin (ZOCOR) 20 MG tablet TAKE 1 TABLET AT BEDTIME 04/10/21   [name], MD  traMADol (ULTRAM) 50 MG tablet Take 1 tablet (50 mg total) by mouth every 6 (six) hours as needed for severe pain. 10/20/21   Mosie Lukes, MD  TRUEplus Lancets 33G MISC USE TO CHECK BLOOD SUGAR ONCE DAILY AND AS NEEDED.  DX CODE: E11.9 07/27/20   Mosie Lukes, MD  Vibegron 75 MG TABS Take 75 mg by mouth daily. 08/25/21   Jaquita Folds, MD  Zinc 50 MG TABS Take 50 mg by mouth daily.    [provider]    Physical Exam    Vital Signs:  Heather [name] does not have vital signs available for review today.  Given telephonic nature of communication, physical exam is limited. AAOx3. NAD. Normal affect.  Speech and respirations are unlabored.  7am: 147/52 125/52 Lightheaded.   Accessory Clinical Findings    None  Assessment & Plan    1.  Preoperative Cardiovascular Risk Assessment:  Ms.  Dugue's perioperative risk of a major cardiac event is 6.6% according to the Revised Cardiac Risk Index (RCRI).  Therefore, she is at high risk for perioperative complications.   Her functional capacity is fair at 4.06 METs according to the [facility] Activity Status Index (DASI). Recommendations: According to ACC/AHA guidelines, no further cardiovascular testing needed.  The patient may proceed to surgery at acceptable risk.   Antiplatelet and/or Anticoagulation Recommendations: Aspirin can be held for 5 days prior  to her surgery.  Please resume Aspirin post operatively when it is felt to be safe from a bleeding standpoint.   I would recommend she keeps her appointment with our pharmacy staff 02/01/2022 for blood pressure optimization.   A copy of this note will be routed to requesting surgeon.  Time:   Today, I have spent 15 minutes with the patient with telehealth technology discussing medical history, symptoms, and management plan.     Elgie Collard, PA-C  01/19/2022, 8:47 AM

## 2022-01-22 DIAGNOSIS — M47816 Spondylosis without myelopathy or radiculopathy, lumbar region: Secondary | ICD-10-CM | POA: Diagnosis not present

## 2022-02-01 ENCOUNTER — Ambulatory Visit (INDEPENDENT_AMBULATORY_CARE_PROVIDER_SITE_OTHER): Payer: Medicare HMO | Admitting: Pharmacist

## 2022-02-01 DIAGNOSIS — E1121 Type 2 diabetes mellitus with diabetic nephropathy: Secondary | ICD-10-CM

## 2022-02-01 DIAGNOSIS — E1169 Type 2 diabetes mellitus with other specified complication: Secondary | ICD-10-CM

## 2022-02-01 DIAGNOSIS — I10 Essential (primary) hypertension: Secondary | ICD-10-CM

## 2022-02-01 DIAGNOSIS — I5032 Chronic diastolic (congestive) heart failure: Secondary | ICD-10-CM

## 2022-02-01 MED ORDER — METOPROLOL SUCCINATE ER 25 MG PO TB24: 12.5000 mg | ORAL_TABLET | Freq: Every day | ORAL | 1 refills | Status: DC

## 2022-02-01 NOTE — Chronic Care Management (AMB) (Signed)
Chronic Care Management Pharmacy Note  02/01/2022 Name:  Heather [name] MRN:  [id number] DOB:  1934/08/10  Summary: Patient had thyroid ultrasound but did recall results. Discussed results and recommendation to recheck in 1 year.  Patient reports that she has about 2 extra months on hand of most of her medications. Reviewed refill history and noted that pharmacy usually fills 2 weeks early each month which over a year will lead to about 6 weeks extra medications. Reviewed medication list with patient and she is taking maintenance medications regularly. Updated prescription for metoprolol ER at [facility] - their directions were 1 tablet daily but patient is taking 0.5 tablet daily.  Home blood glucose has been good - between 100 and 130 mostly days. Last A1c was 7.1% and has increased from 6.4%. If next A1c is higher, consider retrying metformin Er 548m daily or SGLT2 like Jardiance or Farxiga due to also having CHF.   Follow up with PCP in September 2023; Chronic Care Management follow up in November 2023.    Subjective: Heather BAINEis an 87y.o. year old female who is a primary patient of BMosie Lukes MD.  The CCM team was consulted for assistance with disease management and care coordination needs.    Engaged with patient by telephone for follow up visit in response to provider referral for pharmacy case management and/or care coordination services.   Consent to Services:  The patient was given information about Chronic Care Management services, agreed to services, and gave verbal consent prior to initiation of services.  Please see initial visit note for detailed documentation.   Patient Care Team: BMosie Lukes MD as PCP - General (Family Medicine) CSherren Mocha MD as PCP - Cardiology (Cardiology) NIdelle Leech [employer](Optometry) BJanyth Contes MD as Consulting Physician (Obstetrics and Gynecology) WTrula Slade DPM as Consulting Physician (Podiatry) WSharmon Revereas Physician Assistant (Cardiology) ECherre Robins RPH-CPP (Pharmacist) GMichael Boston MD as Consulting Physician (General Surgery) SJaquita Folds MD as Consulting Physician (Gynecology) Armbruster, SCarlota Raspberry MD as Consulting Physician (Gastroenterology)  Recent office visits: 10/20/2021 - Fam Med (Dr BCharlett Blake seen for follow up . Labs checked. Noted that A1c increase - recommended watch CHO intake. Low vitamin D- recommended increase over-the-counter vitamin D to 2000 IU daily. 04/26/2021 - [facility] small bowel obstruction. Checked CBC and BMP and lipase.  Recent consult visits: 01/22/2022 - Ortho (Dr KDelilah Shan Seen for hip pain. Discussed Physical therapy, platelet rich plasma injections and surgical interventions.  12/11/2021 Gen Surgery (Dr RRosendo Gros seen for evaluation of incisional hernia. Planned to postpone surgery since no limitations or symptoms (however patient reports 02/01/2022 that she has decided to have hernia repaired)  11/08/2021 - Sports Med (Dr SRaeford Razor Given steroid injection on right hip bursa.  08/31/2021 - Sports Med (Dr SRaeford Razor Seen for peroneal tendinitis, right. No med changes noted.  08/25/2021 - OB/GYN (Dr SWannetta Sender Seen for OAB and for percutaneous tibial nerve stimulation. Given samples of Gemtesa 751mdaily. F/U 1 month 06/17/2022 - U/S of thyroid to monitor multi nodular thyroid. Noted gorwth of nodule 5. Recommended recheck yearly.  10/13/2021-[facility] CAD and CHF. Changed to taking metoprolol succinate 12.37m237mn am due to sleep issues.  09/22/2021-OB-GYN (Dr SchWannetta Sender/U recurrent UTI. OAB improved with Gemtesa. Completed 6 mos of cephalexin. Started vaginal estrace cream 0.5g nightly for 2 wks, then twice weekly thereafter. F/U 6 mo.  09/04/2021 - optometry (  [place])  06/14/2021 - Cardio (Dr Burt Knack) blood pressure was 130/80; No med changes.  06/08/2021 - EGD completed.  06/05/2021 - GI (Dr  Havery [name]) dysphagia. Ordered EGD; Continue PPI therapy. Recommended discuss f/u neck ultrasound due to past history of thyroid nodule   Hospital visits: 09/11/2021 - Urgent Care visit for constipation, cough, neck pain. Sent to ED 09/11/2021 - ED Visit for constipation, cough and neck pain. labs and imagin ordered. Prescribed Tessalon 157m every 8 hours as needed for cough. Noted acute strain of neck muscles.            Objective:  Lab Results  Component Value Date   CREATININE 1.03 10/20/2021   CREATININE 1.01 (H) 09/11/2021   CREATININE 1.12 (H) 06/14/2021    Lab Results  Component Value Date   HGBA1C 7.1 (H) 10/20/2021   Last diabetic Eye exam:  Lab Results  Component Value Date/Time   HMDIABEYEEXA No Retinopathy 09/26/2021 12:00 AM    Last diabetic Foot exam: No results found for: "HMDIABFOOTEX"      Component Value Date/Time   CHOL 145 10/20/2021 1139   TRIG 112.0 10/20/2021 1139   HDL 62.60 10/20/2021 1139   CHOLHDL 2 10/20/2021 1139   VLDL 22.4 10/20/2021 1139   LDLCALC 60 10/20/2021 1139   LDLDIRECT 73.4 05/20/2014 1651       Latest Ref Rng & Units 10/20/2021   11:39 AM 09/11/2021    5:39 PM 04/26/2021   11:33 AM  Hepatic Function  Total Protein 6.0 - 8.3 g/dL 6.3  6.8  6.1   Albumin 3.5 - 5.2 g/dL 4.4  4.2  4.1   AST 0 - 37 U/L '23  22  18   ' ALT 0 - 35 U/L '19  20  16   ' Alk Phosphatase 39 - 117 U/L 120  117  95   Total Bilirubin 0.2 - 1.2 mg/dL 0.6  1.0  0.5     Lab Results  Component Value Date/Time   TSH 1.95 10/20/2021 11:39 AM   TSH 2.166 01/31/2021 05:01 AM   TSH 2.07 07/14/2020 09:21 AM       Latest Ref Rng & Units 10/20/2021   11:39 AM 09/11/2021    5:39 PM 04/26/2021   11:33 AM  CBC  WBC 4.0 - 10.5 K/uL 6.8  8.2  6.3   Hemoglobin 12.0 - 15.0 g/dL 12.9  12.5  12.1   Hematocrit 36.0 - 46.0 % 38.6  38.0  36.5   Platelets 150.0 - 400.0 K/uL 172.0  161  206.0     Lab Results  Component Value Date/Time   VD25OH 25.57 (L) 10/20/2021  11:39 AM   VD25OH 43.74 02/16/2021 02:03 PM    Clinical ASCVD: Yes  The ASCVD Risk score (Arnett DK, et al., 2019) failed to calculate for the following reasons:   The 2019 ASCVD risk score is only valid for ages 424to 752   Other:  DEXA 01/16/2021 DualFemur Neck Right 01/16/2021 Osteopenia -1.6  DualFemur Neck Right 06/02/2018 Osteopenia -1.6    DualFemur Total Mean 01/16/2021 Normal -0.9  DualFemur Total Mean 06/02/2018 Normal -0.5    Left Forearm Radius 33% 01/16/2021 Normal -1.0  Left Forearm Radius 33% 06/02/2018 Normal -0.6    Major Osteoporotic Fracture: 13.8% Hip Fracture: 3.8%  EF = 60-65% (2022)  Social History   Tobacco Use  Smoking Status Never  Smokeless Tobacco Never   BP Readings from Last 3 Encounters:  11/08/21 (!) 150/60  10/20/21 138/70  10/13/21 (!) 142/60   Pulse Readings from Last 3 Encounters:  10/20/21 63  10/13/21 74  09/22/21 76   Wt Readings from Last 3 Encounters:  11/08/21 148 lb (67.1 kg)  10/20/21 152 lb 6.4 oz (69.1 kg)  10/13/21 152 lb 9.6 oz (69.2 kg)    Assessment: Review of patient past medical history, allergies, medications, health status, including review of consultants reports, laboratory and other test data, was performed as part of comprehensive evaluation and provision of chronic care management services.   SDOH:  (Social Determinants of Health) assessments and interventions performed:      CCM Care Plan  Allergies  Allergen Reactions   Baclofen Other (See Comments)    Hyperactivity    Gabapentin Other (See Comments)    Made her hyper   Nitrofurantoin Rash    Medications Reviewed Today     Reviewed by [name], RPH-CPP (Pharmacist) on 02/01/22 at 1438  Med List Status: <None>   Medication Order Taking? Sig Documenting Provider Last Dose Status Informant  acetaminophen (TYLENOL) 500 MG tablet [id number] Yes Take 1,000 mg by mouth every 6 (six) hours as needed for moderate pain. [provider]  Taking Active Self  amLODipine (NORVASC) 10 MG tablet [id number] Yes TAKE 1 TABLET EVERY DAY Mosie Lukes, MD Taking Active            Med Note Antony Contras, [place] B   Thu Feb 01, 2022  2:31 PM) Takes at night  Ascorbic Acid (VITAMIN C) 500 MG CHEW [id number] Yes Chew 500 mg by mouth daily. [provider] Taking Active Self           Med Note Jonathon Jordan Jun 15, 2021 12:06 PM)    aspirin EC 81 MG tablet [id number] Yes Take 1 tablet (81 mg total) by mouth daily. [name], MD Taking Active Self  Blood Glucose Monitoring Suppl (TRUE METRIX AIR GLUCOSE METER) w/Device KIT [id number] Yes USE TO CHECK BLOOD SUGAR ONCE DAILY AND AS NEEDED.  DX CODE E11.9 Mosie Lukes, MD Taking Active Self  Cholecalciferol (VITAMIN D) 50 MCG (2000 UT) CAPS [id number] Yes Take 2,000 Units by mouth daily.  [provider] Taking Active Self  diclofenac Sodium (VOLTAREN) 1 % GEL [id number] Yes Apply 1 application topically 4 (four) times daily as needed (hip pain). [provider] Taking Active Self  dicyclomine (BENTYL) 20 MG tablet [id number] Yes Take 1 tablet (20 mg total) by mouth 2 (two) times daily as needed for spasms (abdominal cramping). Mesner, Corene Cornea, MD Taking Active Self  estradiol (ESTRACE) 0.1 MG/GM vaginal cream [id number] Yes Place 0.5 g vaginally 2 (two) times a week. Place 0.5g nightly for two weeks then twice a week after Jaquita Folds, MD Taking Active   famotidine (PEPCID) 20 MG tablet [id number] Yes Take 1 tablet (20 mg total) by mouth at bedtime as needed for heartburn or indigestion. Mosie Lukes, MD Taking Active   furosemide (LASIX) 20 MG tablet [id number] Yes Take 1 tablet (20 mg total) by mouth daily.  Patient taking differently: Take 20 mg by mouth daily as needed for edema.   Leeroy Bock, RPH-CPP Taking Active            Med Note Vita Barley Apr 17, 2021  7:54 AM)    glucose blood (TRUE METRIX BLOOD GLUCOSE TEST) test strip  [id number] Yes TEST BLOOD SUGAR EVERY DAY  OR AS NEEDED Mosie Lukes, MD  Taking Active   isosorbide mononitrate (IMDUR) 60 MG 24 hr tablet [id number] Yes TAKE 1 AND 1/2 TABLETS EVERY DAY [name], MD Taking Active   losartan (COZAAR) 100 MG tablet [id number] Yes TAKE 1 TABLET (100 MG TOTAL) BY MOUTH DAILY.  Patient taking differently: Take 50 mg by mouth 2 (two) times daily.   [name], MD Taking Active   magnesium oxide (MAG-OX) 400 MG tablet [id number] Yes Take 400 mg by mouth at bedtime. [provider] Taking Active Self           Med Note Vita Barley Apr 17, 2021  7:56 AM)    metoprolol succinate (TOPROL-XL) 25 MG 24 hr tablet [id number] Yes Take 0.5 tablets (12.5 mg total) by mouth daily. [name], MD Taking Active   nitroGLYCERIN (NITROSTAT) 0.4 MG SL tablet [id number]  Place 1 tablet (0.4 mg total) under the tongue every 5 (five) minutes as needed for chest pain. [name] T, PA-C  Expired 01/12/22 2359 Self  omeprazole (PRILOSEC) 20 MG capsule [id number] Yes TAKE 1 CAPSULE EVERY MORNING Mosie Lukes, MD Taking Active   ondansetron (ZOFRAN-ODT) 4 MG disintegrating tablet [id number] Yes Take 4 mg by mouth every 8 (eight) hours as needed for nausea or vomiting. [provider] Taking Active Self  polyethylene glycol (MIRALAX / GLYCOLAX) 17 g packet [id number] No Take 17 g by mouth daily as needed for mild constipation.  Patient not taking: Reported on 02/01/2022   Wellington Hampshire, PA-C Not Taking Active   pramipexole (MIRAPEX) 0.25 MG tablet [id number] Yes TAKE 1 TABLET TWICE DAILY Ann Held, DO Taking Active   simvastatin (ZOCOR) 20 MG tablet [id number] Yes TAKE 1 TABLET AT BEDTIME [name], MD Taking Active            Med Note Vita Barley Apr 17, 2021  8:01 AM)    traMADol (ULTRAM) 50 MG tablet [id number] Yes Take 1 tablet (50 mg total) by mouth every 6 (six) hours as needed for severe pain. Mosie Lukes, MD  Taking Active   TRUEplus Lancets 33G MISC [id number] Yes USE TO CHECK BLOOD SUGAR ONCE DAILY AND AS NEEDED.  DX CODE: E11.9 Mosie Lukes, MD Taking Active Self  Vibegron 75 MG TABS [id number] Yes Take 75 mg by mouth daily.  Patient taking differently: Take 75 mg by mouth daily as needed.   Jaquita Folds, MD Taking Active   Zinc 50 MG TABS [id number] Yes Take 50 mg by mouth daily. [provider] Taking Active Self            Patient Active Problem List   Diagnosis Date Noted   Hypocalcemia 10/22/2021   Disorder of vitamin B12 10/22/2021   Aortic atherosclerosis ([place]) 10/12/2021   Peroneal tendinitis of right lower extremity 07/13/2021   SBO (small bowel obstruction) ([place]) 04/16/2021   Inguinal hernia of right side with obstruction 04/16/2021   Dysphagia 02/16/2021   Tremor 02/16/2021   Sepsis ([place]) 02/05/2021   AKI (acute kidney injury) ([place]) 02/05/2021   (HFpEF) heart failure with preserved ejection fraction ([place]) 02/05/2021   UTI (urinary tract infection) 01/31/2021   Intra-abdominal fluid collection 01/28/2021   Incarcerated incisional hernia s/p primary repair 01/25/2021 01/25/2021   Stress reaction of bone 01/05/2021   Pain of right sternoclavicular joint 01/05/2021   Screening for osteoporosis 01/05/2021   RUQ pain 12/01/2020   Chronic calculous cholecystitis s/p lap cholecystectomy 01/25/2021 12/01/2020  Lumbar radiculopathy 07/14/2020   Pelvic relaxation due to rectocele 04/06/2020   Prolapse of female pelvic organs 12/22/2019   Greater trochanteric pain syndrome of right lower extremity 10/13/2019   Urinary incontinence, mixed 10/01/2019   Pelvic floor relaxation 06/08/2019   Deep venous thrombosis ([place]) 03/19/2019   Goiter 03/19/2019   Transient ischemic attack 03/19/2019   Acute dermatitis 03/19/2019   Pelvic prolapse 03/08/2019   Pain of breast 01/29/2019   Overactive bladder 01/29/2019   Educated about COVID-19 virus infection 12/02/2018    Hyperlipidemia associated with type 2 diabetes mellitus ([place]) 05/27/2017   Headache 04/17/2015   Multinodular goiter 01/17/2015   S/P total knee arthroplasty 11/29/2014   Insomnia 02/05/2014   Preventative health care 11/22/2013   RLS (restless legs syndrome) 10/04/2013   Lower urinary tract infectious disease 10/04/2013   Cataracts, bilateral 04/02/2013   Hypokalemia 01/05/2012   Anemia 01/05/2012   Mixed anxiety and depressive disorder 01/05/2012   Postoperative anemia due to acute blood loss 11/15/2011   Staphylococcus aureus carrier 11/15/2011   Pre-syncope 10/13/2011   DM (diabetes mellitus) ([place]) 10/06/2011   Pulmonary nodule 10/06/2011   Epigastric pain 10/06/2011   It band syndrome, right 08/28/2011   Renal insufficiency 08/28/2011   Coronary artery disease involving native coronary artery of native heart without angina pectoris 03/07/2010   FATIGUE 01/18/2009   PERSISTENT DISORDER INITIATING/MAINTAINING SLEEP 09/09/2008   DERMATITIS 10/16/2007   PERIPHERAL EDEMA 10/16/2007   Essential hypertension 04/14/2007   Gastroesophageal reflux disease with hiatal hernia 04/14/2007    Immunization History  Administered Date(s) Administered   Fluad Quad(high Dose 65+) 04/18/2021   Influenza Whole 06/08/2005, 06/29/2010   Influenza, High Dose Seasonal PF 04/05/2015, 06/18/2016, 05/27/2017, 05/30/2018   Influenza, Quadrivalent, Recombinant, Inj, Pf 04/22/2019   Influenza,inj,Quad PF,6+ Mos 04/30/2014   Influenza,inj,quad, With Preservative 05/06/2020   Moderna Sars-Covid-2 Vaccination 07/21/2019, 08/18/2019   PFIZER(Purple Top)SARS-COV-2 Vaccination 05/06/2020   Pfizer Covid-19 Vaccine Bivalent Booster 44yr & up 05/03/2021   Pneumococcal Conjugate-13 11/17/2013   Pneumococcal Polysaccharide-23 07/09/2000, 05/27/2017   Tdap 05/24/2015   Zoster Recombinat (Shingrix) 05/03/2021, 07/26/2021    Conditions to be addressed/monitored: CHF, CAD, HTN, HLD, DMII and GERD; restless leg  syndrome; osteopenia; urinary incontinance; rectocele; prolapse;  Care Plan : General Pharmacy (Adult)  Updates made by ECherre Robins RPH-CPP since 02/01/2022 12:00 AM     Problem: Hypertension, Hyperlipidemia/CAD, Diabetes, GERD, RLS   Priority: High  Onset Date: 09/06/2020     Goal: Provide education, support and care coordination for medication therapy and chronic conditions   Start Date: 09/06/2020  Expected End Date: 03/09/2021  Recent Progress: On track  Priority: High  Note:   Current Barriers:  Unable to achieve control of blood pressure - improving Maintain control of type 2 DM Recent acute exacerbation of CHF after surgery.  Frequent UTIs  Pharmacist Clinical Goal(s):  Over the next 180 days, patient will achieve adherence to monitoring guidelines and medication adherence to achieve therapeutic efficacy achieve control of blood pressure as evidenced by home monitoring Maintain control of type 2 DM as evidenced by A1c < 7.5% adhere to prescribed medication regimen as evidenced by fill dates Work with pharmacist and providers on improved access to medication therapy contact provider office for questions/concerns as evidenced notation of same in electronic health record through collaboration with PharmD and provider.    Interventions: 1:1 collaboration with BMosie Lukes MD regarding development and update of comprehensive plan of care as evidenced by provider attestation and co-signature  Inter-disciplinary care team collaboration (see longitudinal plan of care) Comprehensive medication review performed; medication list updated in electronic medical record  Hypertension / CHF / Palpitations (BP goal <130/80) Improving; some BP reading still above goal.  EF was 60-65%  Monitored by cardiologist  NYHA 2 Current treatment: Amlodipine 45m daily  Losartan 1014m- take 5088mwice a day Metoprolol ER 42m69mtake 1 tablet daily  Furosemide 20mg55mly if needed for swelling  / weight gain of more than 3 pounds in 1 day or 5 pounds in 1 week. Medications previously tried: triamterene-hctz (listed in D/C meds. No apparent reason for D/C); spironolactone (acute renal insufficiency); Losartan 100mg 42mte renal insufficiency); metoprolol (low BP) Current home readings: 130 - 145 / 60's (has had a few high readings 162/71 and 158/68)  Potassium supplement stopped 05/12/2021. Potassium checked 06/14/2021 and was found to be 3.9.  Checking weight daily - reports no more than 1 to 2 lbs change Diet: limiting serving sizes; no extra sodium Denies hypotensive/hypertensive symptoms Interventions:  Educated on BP goals and benefits of medications for prevention of heart attack, stroke and kidney damage; Counseled to monitor BP at home 1 to 2 times per week, document, and provide log at future appointments Continue to monitor potassium since stopping supplementation. Recheck in 3 months (patient advised to call sooner if having any symptoms of low potassium - fatigue, muscle cramps / muscle weakness)   Hyperlipidemia/CAD: (LDL goal < 70) Controlled Current treatment: Simvastatin 20mg d73m at bedtime Aspirin 81mg Is64mbide ER 60mg - t54m1.5 tablets daily Medications previously tried: none noted  Current exercise habits: unable to exercise currently due to recent surgeries  Interventions:  Educated on Cholesterol goals;  Recommended to continue current medication  Diabetes (A1c goal <7.5) A1c back at goal.  Last A1c 6.4%.  Current medications: diet Medications previously tried: metformin - stopped due to hypoglycemia Current home glucose readings Usually  - 100 to 125; lowest 99 and highest 130 Denies hypoglycemic/hyperglycemic symptoms Current Diet:  Reports no intake of sugar drinks Limiting meal sizes but not following any particular dietary guidelines per patient Interventions:  Reviewed home blood glucose readings and reviewed goals  Fasting blood glucose  goal (before meals) = 80 to 130 Blood glucose goal after a meal = less than 180  Continue to check blood glucose daily - varying between morning and evening  Reviewed foods to limit that are high in carbohydrate (completed at previous visit)  RLS (Goal: control symptoms) Patient has been having more symptoms the last week - occurring mostly at night B12 was WNL at 920 (02/01/2021) Hemoglobin was 12.1 (04/26/2021) Current treatment  Pramipexole 0.42mg twic53mily Medications previously tried: gabapentin Interventions: Trial of taking pramipexole 0.42mg 2 tab67m at night   Acid Reflux / GERD (goal: decrease acid reflux symptoms) Patient denies breakthrough reflux symptoms Has cholecystectomy 01/2021 Current regimen:  Famotidine 20mg once a58m at night if needed Omeprazole 20mg once a 73m  Interventions: Discussed medications used to control acid reflux Goal is to use lowest dose needed to control symptoms Patient self care activities - Over the next 90 days, patient will: Continue current regimen   Osteopenia:  (goal: prevent fractures)  DEXA 01/16/2021 DualFemur Neck Right 01/16/2021 Osteopenia -1.6  DualFemur Neck Right 06/02/2018 Osteopenia -1.6  DualFemur Total Mean 01/16/2021 Normal -0.9  DualFemur Total Mean 06/02/2018 Normal -0.5  Left Forearm Radius 33% 01/16/2021 Normal -1.0  Left Forearm Radius 33% 06/02/2018 Normal -0.6   Major Osteoporotic  Fracture: 13.8% Hip Fracture: 3.8% T-Scores noted to be stable.  Current regimen:  Vitamin D 2000 IU daily    Interventions: Continue current therapy for bone health  Frequent UTIs / Overactive Bladder Uncontrolled Managed by Dr Wannetta Sender (gyno-urology) Current regimen:  Gemtesa 47m daily as needed  Interventions:  Continue to follow up with Dr SRuby Cola  [facility] management [facility] Comprehensive medication review performed. Continue current medication management  strategy Reviewed Med adherence  Patient Goals/Self-Care Activities Over the next 180 days, patient will:  take medications as prescribed focus on medication adherence by pill count check blood pressure 1 to 2 times per week, document, and provide at future appointments Check blood glucose  twice per week  Follow Up Plan: The care management team will reach out to the patient again over the next 90 days.         Medication Assistance: None required.  Patient affirms current coverage meets needs.  Patient's preferred pharmacy is:  [id number] [place]: [phone]: [phone] - [place]: [phone]: [phone]  Follow Up:  Patient agrees to Care Plan and Follow-up.  Plan: The care management team will reach out to the patient again over the next 90  days.  TaCherre RobinsPharmD Clinical Pharmacist [phone]

## 2022-02-01 NOTE — Patient Instructions (Signed)
Heather [name] It was a pleasure speaking with you  Below is a summary of your health goals and care plan  Patient Goals/Self-Care Activities take medications as prescribed focus on medication adherence by pill count check blood pressure 1 to 2 times per week, document, and provide at future appointments Check blood glucose  twice per week  If you have any questions or concerns, please feel free to contact me either at the phone number below or with a MyChart message.   Keep up the good work!  [name], PharmD Clinical Pharmacist [place] [phone] (direct line)  [phone] (main office number)   Patient verbalizes understanding of instructions and care plan provided today and agrees to view in [place]. Active MyChart status and patient understanding of how to access instructions and care plan via MyChart confirmed with patient.

## 2022-02-05 DIAGNOSIS — E785 Hyperlipidemia, unspecified: Secondary | ICD-10-CM

## 2022-02-05 DIAGNOSIS — E1169 Type 2 diabetes mellitus with other specified complication: Secondary | ICD-10-CM | POA: Diagnosis not present

## 2022-02-05 DIAGNOSIS — I5032 Chronic diastolic (congestive) heart failure: Secondary | ICD-10-CM

## 2022-02-05 DIAGNOSIS — E1121 Type 2 diabetes mellitus with diabetic nephropathy: Secondary | ICD-10-CM

## 2022-02-05 DIAGNOSIS — I10 Essential (primary) hypertension: Secondary | ICD-10-CM | POA: Diagnosis not present

## 2022-02-20 DIAGNOSIS — K432 Incisional hernia without obstruction or gangrene: Secondary | ICD-10-CM | POA: Diagnosis not present

## 2022-02-20 DIAGNOSIS — I5032 Chronic diastolic (congestive) heart failure: Secondary | ICD-10-CM | POA: Diagnosis not present

## 2022-03-22 ENCOUNTER — Ambulatory Visit (INDEPENDENT_AMBULATORY_CARE_PROVIDER_SITE_OTHER): Payer: Medicare HMO | Admitting: Family Medicine

## 2022-03-22 VITALS — BP 138/70 | HR 68 | Temp 97.7°F | Resp 16 | Ht 62.0 in | Wt 151.8 lb

## 2022-03-22 DIAGNOSIS — Z23 Encounter for immunization: Secondary | ICD-10-CM

## 2022-03-22 DIAGNOSIS — E538 Deficiency of other specified B group vitamins: Secondary | ICD-10-CM | POA: Diagnosis not present

## 2022-03-22 DIAGNOSIS — E1121 Type 2 diabetes mellitus with diabetic nephropathy: Secondary | ICD-10-CM | POA: Diagnosis not present

## 2022-03-22 DIAGNOSIS — M25551 Pain in right hip: Secondary | ICD-10-CM | POA: Diagnosis not present

## 2022-03-22 DIAGNOSIS — E1169 Type 2 diabetes mellitus with other specified complication: Secondary | ICD-10-CM

## 2022-03-22 DIAGNOSIS — N289 Disorder of kidney and ureter, unspecified: Secondary | ICD-10-CM

## 2022-03-22 DIAGNOSIS — I10 Essential (primary) hypertension: Secondary | ICD-10-CM

## 2022-03-22 DIAGNOSIS — E559 Vitamin D deficiency, unspecified: Secondary | ICD-10-CM | POA: Diagnosis not present

## 2022-03-22 DIAGNOSIS — E785 Hyperlipidemia, unspecified: Secondary | ICD-10-CM

## 2022-03-22 LAB — COMPREHENSIVE METABOLIC PANEL
ALT: 19 U/L (ref 0–35)
AST: 21 U/L (ref 0–37)
Albumin: 4.2 g/dL (ref 3.5–5.2)
Alkaline Phosphatase: 117 U/L (ref 39–117)
BUN: 25 mg/dL — ABNORMAL HIGH (ref 6–23)
CO2: 31 mEq/L (ref 19–32)
Calcium: 9.5 mg/dL (ref 8.4–10.5)
Chloride: 100 mEq/L (ref 96–112)
Creatinine, Ser: 1 mg/dL (ref 0.40–1.20)
GFR: 51.39 mL/min — ABNORMAL LOW (ref >60.00)
Glucose, Bld: 110 mg/dL — ABNORMAL HIGH (ref 70–99)
Potassium: 4 mEq/L (ref 3.5–5.1)
Sodium: 141 mEq/L (ref 135–145)
Total Bilirubin: 0.5 mg/dL (ref 0.2–1.2)
Total Protein: 6.6 g/dL (ref 6.0–8.3)

## 2022-03-22 LAB — LIPID PANEL
Cholesterol: 141 mg/dL (ref 0–200)
HDL: 64.6 mg/dL (ref >39.00)
LDL Cholesterol: 57 mg/dL (ref 0–99)
NonHDL: 76.71
Total CHOL/HDL Ratio: 2
Triglycerides: 98 mg/dL (ref 0.0–149.0)
VLDL: 19.6 mg/dL (ref 0.0–40.0)

## 2022-03-22 LAB — VITAMIN B12: Vitamin B-12: 332 pg/mL (ref 211–911)

## 2022-03-22 LAB — CBC
HCT: 40.5 % (ref 36.0–46.0)
Hemoglobin: 13.3 g/dL (ref 12.0–15.0)
MCHC: 32.8 g/dL (ref 30.0–36.0)
MCV: 91.5 fl (ref 78.0–100.0)
Platelets: 167 10*3/uL (ref 150.0–400.0)
RBC: 4.43 Mil/uL (ref 3.87–5.11)
RDW: 13.7 % (ref 11.5–15.5)
WBC: 6.8 10*3/uL (ref 4.0–10.5)

## 2022-03-22 LAB — TSH: TSH: 1.91 u[IU]/mL (ref 0.35–5.50)

## 2022-03-22 LAB — VITAMIN D 25 HYDROXY (VIT D DEFICIENCY, FRACTURES): VITD: 23.35 ng/mL — ABNORMAL LOW (ref 30.00–100.00)

## 2022-03-22 LAB — HEMOGLOBIN A1C: Hgb A1c MFr Bld: 7.1 % — ABNORMAL HIGH (ref 4.6–6.5)

## 2022-03-22 NOTE — Patient Instructions (Addendum)
RSV (respiratory syncitial virus) vaccine at pharmacy, Arexvy once  Covid booster when new version out late September At pharmacy High dose flu shot mid Sept to mid Oct   Tdap (tetanus) only if injured or in 2026   Daily probiotics, fiber supplement such as Benefiber 1 tsp daily with 60-80 ounces of fluids. High fiber diet and regular exercise.   Hip Pain The hip is the joint between the upper legs and the lower pelvis. The bones, cartilage, tendons, and muscles of your hip joint support your body and allow you to move around. Hip pain can range from a minor ache to severe pain in one or both of your hips. The pain may be felt on the inside of the hip joint near the groin, or on the outside near the buttocks and upper thigh. You may also have swelling or stiffness in your hip area. Follow these instructions at home: Managing pain, stiffness, and swelling     If directed, put ice on the painful area. To do this: Put ice in a plastic bag. Place a towel between your skin and the bag. Leave the ice on for 20 minutes, 2-3 times a day. If directed, apply heat to the affected area as often as told by your health care provider. Use the heat source that your health care provider recommends, such as a moist heat pack or a heating pad. Place a towel between your skin and the heat source. Leave the heat on for 20-30 minutes. Remove the heat if your skin turns bright red. This is especially important if you are unable to feel pain, heat, or cold. You may have a greater risk of getting burned. Activity Do exercises as told by your health care provider. Avoid activities that cause pain. General instructions  Take over-the-counter and prescription medicines only as told by your health care provider. Keep a journal of your symptoms. Write down: How often you have hip pain. The location of your pain. What the pain feels like. What makes the pain worse. Sleep with a pillow between your legs on your  most comfortable side. Keep all follow-up visits as told by your health care provider. This is important. Contact a health care provider if: You cannot put weight on your leg. Your pain or swelling continues or gets worse after one week. It gets harder to walk. You have a fever. Get help right away if: You fall. You have a sudden increase in pain and swelling in your hip. Your hip is red or swollen or very tender to touch. Summary Hip pain can range from a minor ache to severe pain in one or both of your hips. The pain may be felt on the inside of the hip joint near the groin, or on the outside near the buttocks and upper thigh. Avoid activities that cause pain. Write down how often you have hip pain, the location of the pain, what makes it worse, and what it feels like. This information is not intended to replace advice given to you by your health care provider. Make sure you discuss any questions you have with your health care provider. Document Revised: 11/10/2018 Document Reviewed: 11/10/2018 Elsevier Patient Education  [place].

## 2022-03-23 ENCOUNTER — Other Ambulatory Visit: Payer: Self-pay

## 2022-03-23 MED ORDER — VITAMIN D (ERGOCALCIFEROL) 1.25 MG (50000 UNIT) PO CAPS: 50000.0000 [IU] | ORAL_CAPSULE | ORAL | 4 refills | Status: DC

## 2022-03-25 DIAGNOSIS — E559 Vitamin D deficiency, unspecified: Secondary | ICD-10-CM | POA: Insufficient documentation

## 2022-03-25 NOTE — Assessment & Plan Note (Signed)
Supplement and monitor 

## 2022-03-25 NOTE — Progress Notes (Signed)
Subjective:    Patient ID: Heather [name], female    DOB: [birth year], 87 y.o.   MRN: [id number]  Chief Complaint  Patient presents with   Follow-up    Here for follow up    HPI Patient is in today for follow up on chronic medical concerns. No recent febrile illness or acute hospitalizations. She is struggling with chronic right hip pain that is limiting her movements. She is nota surgical candidate so she stays as active as she is able. Her sugars have been well controlled and her am sugar today was 116. No complaints of polyuria or polydipsia. Denies CP/palp/SOB/HA/congestion/fevers/GI or GU c/o. Taking meds as prescribed.  Past Medical History:  Diagnosis Date   Aortic atherosclerosis ([place]) 10/12/2021   Chronic stable angina ([place])    Coronary artery disease cardiologist--- dr Burt Knack   hx NSTEMI  w/ cardiac cath 03-16-2005 PCI with DES to LCx;   cardiac cath 06-27-2005  PCI with DES to RCA with residual dx LAD manage medcially;  lexiscan 01-26-20211 normal no ischemia, ef 70%;  stress echo w/ dobutamine 10-24-2011 negative ishcmeia , normal ef // Myoview 2/22: EF 67, no ischemia or infarction, no TID, low risk    Diabetes mellitus type 2, diet-controlled ([place])    followed by pcp  (10-19-2020  pt stated checks daily in am,  fasting blood surgar--- 115--120s)   DOE (dyspnea on exertion)    per pt "when I over do",  ok with household chores   Echocardiogram 08/2020    Echocardiogram 2/22: EF 55-60, no RWMA, mild LVH, Gr 2 DD, GLS-21.7%, normal RVSF, trivial MR, RVSP 39.5   Edema of right lower extremity    GERD (gastroesophageal reflux disease)    Hiatal hernia    recurrence,  hx HH repair 1989   History of cervical cancer    s/p  vaginal hysterectomy   History of DVT of lower extremity 2016   11-29-2014 post op right TKA of right lower extremity and completed xarelto    History of esophageal stricture    hx s/p dilatation's   History of gastric ulcer 2005 approx.   History of  palpitations 2010   event monitor 07-07-2009 showed NSR w/ freq. SVT ectopies with short runs, rare PVCs   History of TIA (transient ischemic attack) 06/1999   12-15-2019  per pt had several TIA between 12/ 2000 to 02/ 2001 , was sent to specialist '@[facility]' , had test that was normal (10-19-2020 pt stated no TIAs since ) but has residual of essential tremors of right arm/ hand   Hypertension    Intermittent palpitations    IT band syndrome    Migraine    "ice pick headche lasts about 30 seconds"   Mixed hyperlipidemia    Mixed incontinence urge and stress    Multiple thyroid nodules    followed by pcp---   ultrasound 11-22-2014 no bx   (12-15-2019 per pt had a endocrinologist and was told did not need bx)   OA (osteoarthritis)    knees, elbow, hip, ankles   Occasional tremors    right arm/ hand  s/p TIA residual 2000   Osteoporosis    taking vitamin d   Peroneal DVT (deep venous thrombosis) ([place]) 12/22/2014   Right bundle branch block (RBBB) with left anterior fascicular block (LAFB)    RLS (restless legs syndrome)    S/P drug eluting coronary stent placement 2006   03-16-2005  PCI x1 DES to LCx;   06-27-2005  PCI x1 DES to RCA   Urinary retention    post op sling prodecure on 10-27-2020, has foley cathether    Past Surgical History:  Procedure Laterality Date   ANTERIOR AND POSTERIOR REPAIR N/A 12/22/2019   Procedure: ANTERIOR (CYSTOCELE)  REPAIR;  Surgeon: Janyth Contes, MD;  Location: [facility];  Service: Gynecology;  Laterality: N/A;   ANTERIOR AND POSTERIOR REPAIR WITH SACROSPINOUS FIXATION N/A 10/27/2020   Procedure: SACROSPINOUS LIGAMENT FIXATION;  Surgeon: Jaquita Folds, MD;  Location[facility];  Service: Gynecology;  Laterality: N/A;   BLADDER SUSPENSION N/A 10/27/2020   Procedure: TRANSVAGINAL TAPE (TVT) PROCEDURE;  Surgeon: Jaquita Folds, MD;  Location: [facility];  Service: Gynecology;  Laterality: N/A;    CATARACT EXTRACTION W/ INTRAOCULAR LENS  IMPLANT, BILATERAL  2015   CHOLECYSTECTOMY N/A 01/25/2021   Procedure: LAPAROSCOPIC CHOLECYSTECTOMY WITH INTRAOPERATIVE CHOLANGIOGRAM AND LYSIS OF ADHESIONS;  Surgeon: Michael Boston, MD;  Location: [facility];  Service: General;  Laterality: N/A;   COLONOSCOPY  last one ?   CORONARY ANGIOPLASTY WITH STENT PLACEMENT  03-16-2005   dr Lia Foyer   PCI and DES x1 to LCx   CORONARY ANGIOPLASTY WITH STENT PLACEMENT  06-27-2005  dr Lia Foyer   PCI and DES x1 to RCA with residual disease LAD 70-80% to manage medically   CYSTOSCOPY N/A 10/27/2020   Procedure: CYSTOSCOPY;  Surgeon: Jaquita Folds, MD;  Location: [facility];  Service: Gynecology;  Laterality: N/A;   CYSTOSCOPY N/A 11/09/2020   Procedure: CYSTOSCOPY;  Surgeon: Jaquita Folds, MD;  Location: [facility];  Service: Gynecology;  Laterality: N/A;   FOOT SURGERY Left 1990s   left foot stress fracture repair, per pt no hardware   HIATAL HERNIA REPAIR  1989   INCISIONAL HERNIA REPAIR N/A 01/25/2021   Procedure: PRIMARY REPAIR OF INCISIONAL HERNIA;  Surgeon: Michael Boston, MD;  Location: [facility];  Service: General;  Laterality: N/A;   INGUINAL HERNIA REPAIR Left 04/17/2021   Procedure: LAPAROSCOPIC LEFT INGUINAL HERNIA REPAIR WITH MESH;  Surgeon: Ralene Ok, MD;  Location: [facility];  Service: General;  Laterality: Left;   KNEE ARTHROSCOPY Bilateral right ?/   left x2 , last one 09-12-2009 @ [facility]   PUBOVAGINAL SLING N/A 11/09/2020   Procedure: REVISION OF PUBO-VAGINAL SLING;  Surgeon: Jaquita Folds, MD;  Location: [facility];  Service: Gynecology;  Laterality: N/A;   RECTOCELE REPAIR N/A 04/20/2020   Procedure: POSTERIOR REPAIR (RECTOCELE);  Surgeon: Janyth Contes, MD;  Location: [facility];  Service: Gynecology;  Laterality: N/A;   RECTOCELE REPAIR N/A 10/27/2020   Procedure: POSTERIOR REPAIR (RECTOCELE);  Surgeon:  Jaquita Folds, MD;  Location: [facility];  Service: Gynecology;  Laterality: N/A;  total time requested for all procedures is 2 hours   TOTAL KNEE ARTHROPLASTY  11/12/2011   Procedure: TOTAL KNEE ARTHROPLASTY;  Surgeon: Lorn Junes, MD;  Location: [place];  Service: Orthopedics;  Laterality: Left;  Dr Noemi Chapel wants 90 minutes for this case   TOTAL KNEE ARTHROPLASTY Right 11/29/2014   Procedure: RIGHT TOTAL KNEE ARTHROPLASTY;  Surgeon: Vickey Huger, MD;  Location: [place];  Service: Orthopedics;  Laterality: Right;   UPPER GASTROINTESTINAL ENDOSCOPY  last one 04-25-2017   with dilatation esophageal stricture and savary dilatation   VAGINAL HYSTERECTOMY  1988    no ovaries removed for bleeeding    Family History  Problem Relation Age of Onset  Stroke Father        family hx of M 1st degree relative <50   Coronary artery disease Mother    Heart disease Mother    Depression Brother    Stroke Brother    Diabetes Brother    Cancer Brother        bladder with mets   Diabetes Daughter        borderline   Hypertension Daughter    Arthritis Other        family hx of   Hypertension Other        family hx of   Other Other        family hx of cardiovascular disorder   Thyroid disease Daughter    Breast cancer Neg Hx    Colon cancer Neg Hx    Anesthesia problems Neg Hx    Hypotension Neg Hx    Malignant hyperthermia Neg Hx    Pseudochol deficiency Neg Hx    Colon polyps Neg Hx    Esophageal cancer Neg Hx    Rectal cancer Neg Hx    Stomach cancer Neg Hx     Social History   Socioeconomic History   Marital status: Widowed    Spouse name: Not on file   Number of children: 3   Years of education: 37   Highest education level: Not on file  Occupational History   Occupation: works partime in Office manager: RETIRED    Comment: retired  Tobacco Use   Smoking status: Never   Smokeless tobacco: Never  Vaping Use   Vaping Use: Never used  Substance and  Sexual Activity   Alcohol use: No    Alcohol/week: 0.0 standard drinks of alcohol   Drug use: Never   Sexual activity: Not Currently    Birth control/protection: Surgical    Comment: lives alone  Other Topics Concern   Not on file  Social History Narrative   Lives with husband, Caffeine use- Half Caffeine)- 2 cups daily.  3 children living, one passed away.   Education: HS. Business college.  Retired.    Social Determinants of Health   Financial Resource Strain: Low Risk  (11/28/2021)   Overall Financial Resource Strain (CARDIA)    Difficulty of Paying Living Expenses: Not hard at all  Food Insecurity: No Food Insecurity (11/28/2021)   Hunger Vital Sign    Worried About Running Out of Food in the Last Year: Never true    Ran Out of Food in the Last Year: Never true  Transportation Needs: No Transportation Needs (11/28/2021)   PRAPARE - Hydrologist (Medical): No    Lack of Transportation (Non-Medical): No  Physical Activity: Inactive (11/28/2021)   Exercise Vital Sign    Days of Exercise per Week: 0 days    Minutes of Exercise per Session: 0 min  Stress: No Stress Concern Present (11/28/2021)   [place]    Feeling of Stress : Not at all  Social Connections: Moderately Isolated (11/28/2021)   Social Connection and Isolation Panel [NHANES]    Frequency of Communication with Friends and Family: More than three times a week    Frequency of Social Gatherings with Friends and Family: Once a week    Attends Religious Services: 1 to 4 times per year    Active Member of [employer] or Organizations: No    Attends Archivist Meetings: Never  Marital Status: Widowed  Intimate Partner Violence: Not At Risk (11/28/2021)   Humiliation, Afraid, Rape, and Kick questionnaire    Fear of Current or Ex-Partner: No    Emotionally Abused: No    Physically Abused: No    Sexually Abused: No     Outpatient Medications Prior to Visit  Medication Sig Dispense Refill   acetaminophen (TYLENOL) 500 MG tablet Take 1,000 mg by mouth every 6 (six) hours as needed for moderate pain.     amLODipine (NORVASC) 10 MG tablet TAKE 1 TABLET EVERY DAY 90 tablet 1   Ascorbic Acid (VITAMIN C) 500 MG CHEW Chew 500 mg by mouth daily.     aspirin EC 81 MG tablet Take 1 tablet (81 mg total) by mouth daily.     Blood Glucose Monitoring Suppl (TRUE METRIX AIR GLUCOSE METER) w/Device KIT USE TO CHECK BLOOD SUGAR ONCE DAILY AND AS NEEDED.  DX CODE E11.9 1 kit 0   Cholecalciferol (VITAMIN D) 50 MCG (2000 UT) CAPS Take 2,000 Units by mouth daily.      diclofenac Sodium (VOLTAREN) 1 % GEL Apply 1 application topically 4 (four) times daily as needed (hip pain).     dicyclomine (BENTYL) 20 MG tablet Take 1 tablet (20 mg total) by mouth 2 (two) times daily as needed for spasms (abdominal cramping). 20 tablet 0   estradiol (ESTRACE) 0.1 MG/GM vaginal cream Place 0.5 g vaginally 2 (two) times a week. Place 0.5g nightly for two weeks then twice a week after 30 g 11   famotidine (PEPCID) 20 MG tablet Take 1 tablet (20 mg total) by mouth at bedtime as needed for heartburn or indigestion. 90 tablet 1   furosemide (LASIX) 20 MG tablet Take 1 tablet (20 mg total) by mouth daily. (Patient taking differently: Take 20 mg by mouth daily as needed for edema.) 30 tablet 11   glucose blood (TRUE METRIX BLOOD GLUCOSE TEST) test strip TEST BLOOD SUGAR EVERY DAY  OR AS NEEDED 200 strip 12   isosorbide mononitrate (IMDUR) 60 MG 24 hr tablet TAKE 1 AND 1/2 TABLETS EVERY DAY 135 tablet 2   losartan (COZAAR) 100 MG tablet TAKE 1 TABLET (100 MG TOTAL) BY MOUTH DAILY. (Patient taking differently: Take 50 mg by mouth 2 (two) times daily.) 90 tablet 3   magnesium oxide (MAG-OX) 400 MG tablet Take 400 mg by mouth at bedtime.     metoprolol succinate (TOPROL-XL) 25 MG 24 hr tablet Take 0.5 tablets (12.5 mg total) by mouth daily. 45 tablet 1    omeprazole (PRILOSEC) 20 MG capsule TAKE 1 CAPSULE EVERY MORNING 90 capsule 1   ondansetron (ZOFRAN-ODT) 4 MG disintegrating tablet Take 4 mg by mouth every 8 (eight) hours as needed for nausea or vomiting.     polyethylene glycol (MIRALAX / GLYCOLAX) 17 g packet Take 17 g by mouth daily as needed for mild constipation. 14 each 0   pramipexole (MIRAPEX) 0.25 MG tablet TAKE 1 TABLET TWICE DAILY 180 tablet 1   simvastatin (ZOCOR) 20 MG tablet TAKE 1 TABLET AT BEDTIME 90 tablet 3   traMADol (ULTRAM) 50 MG tablet Take 1 tablet (50 mg total) by mouth every 6 (six) hours as needed for severe pain. 15 tablet 0   TRUEplus Lancets 33G MISC USE TO CHECK BLOOD SUGAR ONCE DAILY AND AS NEEDED.  DX CODE: E11.9 200 each 1   Vibegron 75 MG TABS Take 75 mg by mouth daily. (Patient taking differently: Take 75 mg by mouth daily  as needed.) 30 tablet 5   Zinc 50 MG TABS Take 50 mg by mouth daily.     nitroGLYCERIN (NITROSTAT) 0.4 MG SL tablet Place 1 tablet (0.4 mg total) under the tongue every 5 (five) minutes as needed for chest pain. 25 tablet 11   No facility-administered medications prior to visit.    Allergies  Allergen Reactions   Baclofen Other (See Comments)    Hyperactivity    Gabapentin Other (See Comments)    Made her hyper   Nitrofurantoin Rash    Review of Systems  Constitutional:  Negative for fever and malaise/fatigue.  HENT:  Negative for congestion.   Eyes:  Negative for blurred vision.  Respiratory:  Negative for shortness of breath.   Cardiovascular:  Negative for chest pain, palpitations and leg swelling.  Gastrointestinal:  Negative for abdominal pain, blood in stool and nausea.  Genitourinary:  Negative for dysuria and frequency.  Musculoskeletal:  Positive for joint pain. Negative for falls.  Skin:  Negative for rash.  Neurological:  Negative for dizziness, loss of consciousness and headaches.  Endo/Heme/Allergies:  Negative for environmental allergies.   Psychiatric/Behavioral:  Negative for depression. The patient is not nervous/anxious.        Objective:    Physical Exam Constitutional:      General: She is not in acute distress.    Appearance: She is well-developed.  HENT:     Head: Normocephalic and atraumatic.  Eyes:     Conjunctiva/sclera: Conjunctivae normal.  Neck:     Thyroid: No thyromegaly.  Cardiovascular:     Rate and Rhythm: Normal rate and regular rhythm.     Heart sounds: Normal heart sounds. No murmur heard. Pulmonary:     Effort: Pulmonary effort is normal. No respiratory distress.     Breath sounds: Normal breath sounds.  Abdominal:     General: Bowel sounds are normal. There is no distension.     Palpations: Abdomen is soft. There is no mass.     Tenderness: There is no abdominal tenderness.  Musculoskeletal:     Cervical back: Neck supple.  Lymphadenopathy:     Cervical: No cervical adenopathy.  Skin:    General: Skin is warm and dry.  Neurological:     Mental Status: She is alert and oriented to person, place, and time.  Psychiatric:        Behavior: Behavior normal.     BP 138/70 (BP Location: Right Arm, Patient Position: Sitting, Cuff Size: Small)   Pulse 68   Temp 97.7 F (36.5 C) (Oral)   Resp 16   Ht '5\' 2"'  (1.575 m)   Wt 151 lb 12.8 oz (68.9 kg)   LMP  (LMP Unknown)   SpO2 95%   BMI 27.76 kg/m  Wt Readings from Last 3 Encounters:  03/22/22 151 lb 12.8 oz (68.9 kg)  11/08/21 148 lb (67.1 kg)  10/20/21 152 lb 6.4 oz (69.1 kg)    Diabetic Foot Exam - Simple   No data filed    Lab Results  Component Value Date   WBC 6.8 03/22/2022   HGB 13.3 03/22/2022   HCT 40.5 03/22/2022   PLT 167.0 03/22/2022   GLUCOSE 110 (H) 03/22/2022   CHOL 141 03/22/2022   TRIG 98.0 03/22/2022   HDL 64.60 03/22/2022   LDLDIRECT 73.4 05/20/2014   LDLCALC 57 03/22/2022   ALT 19 03/22/2022   AST 21 03/22/2022   NA 141 03/22/2022   K 4.0 03/22/2022   CL 100  03/22/2022   CREATININE 1.00  03/22/2022   BUN 25 (H) 03/22/2022   CO2 31 03/22/2022   TSH 1.91 03/22/2022   INR 1.1 02/07/2021   HGBA1C 7.1 (H) 03/22/2022   MICROALBUR <0.7 10/20/2021    Lab Results  Component Value Date   TSH 1.91 03/22/2022   Lab Results  Component Value Date   WBC 6.8 03/22/2022   HGB 13.3 03/22/2022   HCT 40.5 03/22/2022   MCV 91.5 03/22/2022   PLT 167.0 03/22/2022   Lab Results  Component Value Date   NA 141 03/22/2022   K 4.0 03/22/2022   CO2 31 03/22/2022   GLUCOSE 110 (H) 03/22/2022   BUN 25 (H) 03/22/2022   CREATININE 1.00 03/22/2022   BILITOT 0.5 03/22/2022   ALKPHOS 117 03/22/2022   AST 21 03/22/2022   ALT 19 03/22/2022   PROT 6.6 03/22/2022   ALBUMIN 4.2 03/22/2022   CALCIUM 9.5 03/22/2022   ANIONGAP 4 (L) 09/11/2021   EGFR 48 (L) 06/14/2021   GFR 51.39 (L) 03/22/2022   Lab Results  Component Value Date   CHOL 141 03/22/2022   Lab Results  Component Value Date   HDL 64.60 03/22/2022   Lab Results  Component Value Date   LDLCALC 57 03/22/2022   Lab Results  Component Value Date   TRIG 98.0 03/22/2022   Lab Results  Component Value Date   CHOLHDL 2 03/22/2022   Lab Results  Component Value Date   HGBA1C 7.1 (H) 03/22/2022       Assessment & Plan:   Problem List Items Addressed This Visit     Essential hypertension - Primary (Chronic)    Well controlled, no changes to meds. Encouraged heart healthy diet such as the DASH diet and exercise as tolerated.       Relevant Orders   CBC (Completed)   Comprehensive metabolic panel (Completed)   TSH (Completed)   DM (diabetes mellitus) (HCC) (Chronic)    hgba1c acceptable, minimize simple carbs. Increase exercise as tolerated. Continue current meds      Relevant Orders   Lipid panel (Completed)   Hemoglobin A1c (Completed)   Hyperlipidemia associated with type 2 diabetes mellitus (HCC) (Chronic)    Encourage heart healthy diet such as MIND or DASH diet, increase exercise, avoid trans fats,  simple carbohydrates and processed foods, consider a krill or fish or flaxseed oil cap daily.       Renal insufficiency    Hydrate and monitor      Right hip pain    It limits her activity but she is not a surgical candidate. Encouraged moist heat and gentle stretching as tolerated. May try Tylenol and prescription meds as directed and report if symptoms worsen or seek immediate care      Disorder of vitamin B12    Continue to monitor      Relevant Orders   Vitamin B12 (Completed)   Vitamin D deficiency    Supplement and monitor.       Relevant Orders   VITAMIN D 25 Hydroxy (Vit-D Deficiency, Fractures) (Completed)   Other Visit Diagnoses     Need for influenza vaccination       Relevant Orders   Flu Vaccine QUAD High Dose(Fluad) (Completed)       I am having Laverda M. Elza maintain her magnesium oxide, Vitamin D, aspirin EC, Vitamin C, True Metrix Air Glucose Meter, TRUEplus Lancets 33G, nitroGLYCERIN, acetaminophen, Zinc, diclofenac Sodium, furosemide, simvastatin, dicyclomine, ondansetron, polyethylene glycol, Vibegron,  amLODipine, True Metrix Blood Glucose Test, estradiol, isosorbide mononitrate, pramipexole, famotidine, traMADol, omeprazole, losartan, and metoprolol succinate.  No orders of the defined types were placed in this encounter.    [name], MD

## 2022-03-25 NOTE — Assessment & Plan Note (Signed)
Well controlled, no changes to meds. Encouraged heart healthy diet such as the DASH diet and exercise as tolerated.  °

## 2022-03-25 NOTE — Assessment & Plan Note (Signed)
Hydrate and monitor 

## 2022-03-25 NOTE — Assessment & Plan Note (Signed)
Encourage heart healthy diet such as MIND or DASH diet, increase exercise, avoid trans fats, simple carbohydrates and processed foods, consider a krill or fish or flaxseed oil cap daily.  °

## 2022-03-25 NOTE — Assessment & Plan Note (Signed)
hgba1c acceptable, minimize simple carbs. Increase exercise as tolerated. Continue current meds 

## 2022-03-25 NOTE — Assessment & Plan Note (Signed)
It limits her activity but she is not a surgical candidate. Encouraged moist heat and gentle stretching as tolerated. May try Tylenol and prescription meds as directed and report if symptoms worsen or seek immediate care

## 2022-03-25 NOTE — Assessment & Plan Note (Signed)
Continue to monitor

## 2022-03-27 ENCOUNTER — Encounter: Payer: Self-pay | Admitting: Obstetrics and Gynecology

## 2022-03-27 ENCOUNTER — Ambulatory Visit: Payer: Medicare HMO | Admitting: Obstetrics and Gynecology

## 2022-03-27 VITALS — BP 169/78 | HR 61

## 2022-03-27 DIAGNOSIS — N3281 Overactive bladder: Secondary | ICD-10-CM | POA: Diagnosis not present

## 2022-03-27 DIAGNOSIS — N390 Urinary tract infection, site not specified: Secondary | ICD-10-CM | POA: Diagnosis not present

## 2022-03-27 MED ORDER — VIBEGRON 75 MG PO TABS
75.0000 mg | ORAL_TABLET | Freq: Every day | ORAL | 11 refills | Status: DC
Start: 2022-03-27 — End: 2022-08-02

## 2022-03-27 NOTE — Progress Notes (Signed)
[facility] Return Visit  SUBJECTIVE  History of Present Illness: Heather [name] is a 87 y.o. female seen in follow-up for overactive bladder and recurrent UTI.   She is on Gemtesa '75mg'$ . She is only taking it when she is traveling because she had some swelling in her legs when she was taking the medication regularly. Most of the time she is able to make it to the bathroom without leaking. She is able to hold it well. Denies leakage with cough/ sneeze. She is emptying her bladder well.   Has not had any urinary tract infections. She is using the estrogen cream twice a week.   s/p Exam under anesthesia, posterior repair, sacrospinous ligament fixation, midurethral sling and cystoscopy on 10/27/20 as well as a sling release on 11/09/20  Past Medical History: Patient  has a past medical history of Aortic atherosclerosis ([place]) (10/12/2021), Chronic stable angina ([place]), Coronary artery disease (cardiologist--- dr Burt Knack), Diabetes mellitus type 2, diet-controlled ([place]), DOE (dyspnea on exertion), Echocardiogram 08/2020, Edema of right lower extremity, GERD (gastroesophageal reflux disease), Hiatal hernia, History of cervical cancer, History of DVT of lower extremity (2016), History of esophageal stricture, History of gastric ulcer (2005 approx.), History of palpitations (2010), History of TIA (transient ischemic attack) (06/1999), Hypertension, Intermittent palpitations, IT band syndrome, Migraine, Mixed hyperlipidemia, Mixed incontinence urge and stress, Multiple thyroid nodules, OA (osteoarthritis), Occasional tremors, Osteoporosis, Peroneal DVT (deep venous thrombosis) ([place]) (12/22/2014), Right bundle branch block (RBBB) with left anterior fascicular block (LAFB), RLS (restless legs syndrome), S/P drug eluting coronary stent placement (2006), and Urinary retention.   Past Surgical History: She  has a past surgical history that includes Knee arthroscopy (Bilateral, right ?/   left x2 , last one  09-12-2009 @ [facility]); Foot surgery (Left, 1990s); Upper gastrointestinal endoscopy (last one 04-25-2017); Total knee arthroplasty (11/12/2011); Total knee arthroplasty (Right, 11/29/2014); Colonoscopy (last one ?); Cataract extraction w/ intraocular lens  implant, bilateral (2015); Hiatal hernia repair (1989); Anterior and posterior repair (N/A, 12/22/2019); Vaginal hysterectomy (1988); Coronary angioplasty with stent (03-16-2005   dr Lia Foyer); Coronary angioplasty with stent (06-27-2005  dr Lia Foyer); Rectocele repair (N/A, 04/20/2020); Rectocele repair (N/A, 10/27/2020); Anterior and posterior repair with sacrospinous fixation (N/A, 10/27/2020); Bladder suspension (N/A, 10/27/2020); Cystoscopy (N/A, 10/27/2020); Pubovaginal sling (N/A, 11/09/2020); Cystoscopy (N/A, 11/09/2020); Cholecystectomy (N/A, 01/25/2021); Incisional hernia repair (N/A, 01/25/2021); and Inguinal hernia repair (Left, 04/17/2021).   Medications: She has a current medication list which includes the following prescription(s): acetaminophen, amlodipine, vitamin c, aspirin ec, true metrix air glucose meter, vitamin d, diclofenac sodium, dicyclomine, estradiol, famotidine, furosemide, true metrix blood glucose test, isosorbide mononitrate, losartan, magnesium oxide, metoprolol succinate, omeprazole, ondansetron, pramipexole, simvastatin, tramadol, trueplus lancets 33g, vitamin d (ergocalciferol), zinc, and vibegron.   Allergies: Patient is allergic to baclofen, gabapentin, and nitrofurantoin.   Social History: Patient  reports that she has never smoked. She has never used smokeless tobacco. She reports that she does not drink alcohol and does not use drugs.      OBJECTIVE     Physical Exam: Vitals:   03/27/22 1250  BP: (!) 169/78  Pulse: 61   Gen: No apparent distress, A&O x 3.  Detailed Urogynecologic Evaluation:  Deferred.    ASSESSMENT AND PLAN    Heather [name] is a 87 y.o. with:  1. Overactive bladder   2. Recurrent urinary tract  infection     - For rUTI, continue vaginal estrace cream 0.5g twice weekly. - refilled Gemtresa '75mg'$ . We discussed that this is meant  to be taken daily but it is ok to take it less often if that works well for her.   Return 1 year or sooner if needed  Jaquita Folds, MD  Time spent: I spent 17 minutes dedicated to the care of this patient on the date of this encounter to include pre-visit review of records, face-to-face time with the patient  and post visit documentation and ordering medication/ testing.

## 2022-04-02 ENCOUNTER — Encounter: Payer: Self-pay | Admitting: Family Medicine

## 2022-04-04 ENCOUNTER — Other Ambulatory Visit: Payer: Self-pay | Admitting: Family Medicine

## 2022-04-11 ENCOUNTER — Other Ambulatory Visit: Payer: Self-pay | Admitting: Family Medicine

## 2022-04-11 ENCOUNTER — Other Ambulatory Visit: Payer: Self-pay | Admitting: Cardiovascular Disease

## 2022-04-12 ENCOUNTER — Other Ambulatory Visit (HOSPITAL_COMMUNITY)
Admission: RE | Admit: 2022-04-12 | Discharge: 2022-04-12 | Disposition: A | Discharge status: Home / Self Care | Payer: Medicare HMO | Attending: Obstetrics and Gynecology | Admitting: Obstetrics and Gynecology

## 2022-04-12 ENCOUNTER — Ambulatory Visit (INDEPENDENT_AMBULATORY_CARE_PROVIDER_SITE_OTHER): Payer: Medicare HMO

## 2022-04-12 DIAGNOSIS — R350 Frequency of micturition: Secondary | ICD-10-CM | POA: Diagnosis not present

## 2022-04-12 LAB — POCT URINALYSIS DIPSTICK
Bilirubin, UA: NEGATIVE
Blood, UA: NEGATIVE
Glucose, UA: NEGATIVE
Ketones, UA: NEGATIVE
Leukocytes, UA: NEGATIVE
Nitrite, UA: NEGATIVE
Protein, UA: NEGATIVE
Spec Grav, UA: 1.015 (ref 1.010–1.025)
Urobilinogen, UA: 0.2 E.U./dL
pH, UA: 6 (ref 5.0–8.0)

## 2022-04-12 NOTE — Patient Instructions (Signed)
Your Urine dip that was done in office was NEGATIVE. Dr. Wannetta Sender does not prescribe antibiotics with out a positive culture. I am sending the urine off for culture and you can take AZO over the counter for your discomfort. We will contact you when the results are back between 3-5 days. If you have any questions or concerns please feel free to call us at [phone]

## 2022-04-12 NOTE — Addendum Note (Signed)
Addended by: Elita Quick on: 04/12/2022 08:59 AM   Modules accepted: Orders

## 2022-04-12 NOTE — Progress Notes (Signed)
Heather [name] is a 87 y.o. female arrived today with her daughter complains of UTI sx. A urine sample was collected and tested. Pt daughter said she has chills last night and a slight fever of "98.8". I did explain that 98.8 is not a fever. Ptsa daughter was insistant that that I too high of a temp for her mother. A urine specimen was collected and POCT Urine was done. Urine was sent for culture POCT Urine was Negative

## 2022-04-13 LAB — URINE CULTURE: Culture: NO GROWTH

## 2022-04-24 ENCOUNTER — Ambulatory Visit: Payer: Self-pay | Admitting: Licensed Clinical Social Worker

## 2022-04-24 NOTE — Patient Outreach (Signed)
  Care Coordination   Initial Visit Note   04/24/2022 Name: Heather [name] MRN: [id number] DOB: 1935-06-05  Heather [name] is a 87 y.o. year old female who sees Mosie Lukes, MD for primary care. I spoke with  [name] by phone today  What matters to the patients health and wellness today? Client wants to drive as needed; she wants to live independently in her home. She wants to complete ADLs as needed. She wants to be able to schedule medical appointments as needed with medical providers    Goals Addressed               This Visit's Progress     Patient wants to continue to drive as needed, wants to continue to live indendpendently in her home. wants to compete daily ADLs, wants to be able to see medical providers as needed (pt-stated)        Interventions Discussed Care Coordination program support with client Discussed client needs. She said she drives as needed. She said she has support from her daughter.  She said she sometimes has to wait to get appointments at medical providers. This can be frustrating to her.   Reviewed energy level of patient. She said she has low energy Reviewed sleeping challenges. She said she has difficulty sleeping Provided counseling support to client Reviewed tremor issues faced. Reviewed vision of client. She said she is doing well with vision. She uses reading glasses as needed Client agreed for LCSW to call her back in 4 weeks to assess her needs at that time.    SDOH assessments and interventions completed:  Yes  SDOH Interventions Today    Flowsheet Row Most Recent Value  SDOH Interventions   Physical Activity Interventions Other (Comments)  [may have some walking challenges]  Stress Interventions Provide Counseling  [difficulty sleeping]        Care Coordination Interventions Activated:  Yes  Care Coordination Interventions:  Yes, provided   Follow up plan: Follow up call scheduled for 05/14/22 at 10:00 AM    Encounter  Outcome:  Pt. Visit Completed

## 2022-04-24 NOTE — Patient Instructions (Signed)
Visit Information  Thank you for taking time to visit with me today. Please don't hesitate to contact me if I can be of assistance to you before our next scheduled telephone appointment.  Following are the goals we discussed today:   Our next appointment is by telephone on 05/14/22 at 10:00 AM   Please call the care guide team at [phone] if you need to cancel or reschedule your appointment.   If you are experiencing a Mental Health or [place] or need someone to talk to, please go to [facility] [place] [phone])   Following is a copy of your full plan of care:   Interventions Discussed Care Coordination program support with client Discussed client needs. She said she drives as needed. She said she has support from her daughter.  She said she sometimes has to wait to get appointments at medical providers. This can be frustrating to her.   Reviewed energy level of patient. She said she has low energy Reviewed sleeping challenges. She said she has difficulty sleeping Provided counseling support to client Reviewed tremor issues faced. Reviewed vision of client. She said she is doing well with vision. She uses reading glasses as needed Client agreed for LCSW to call her back in 4 weeks to assess her needs at that time.  Ms. Tallman was given information about Care Management services by the embedded care coordination team including:  Care Management services include personalized support from designated clinical staff supervised by her physician, including individualized plan of care and coordination with other care providers 24/7 contact phone numbers for assistance for urgent and routine care needs. The patient may stop CCM services at any time (effective at the end of the month) by phone call to the office staff.  Patient agreed to services and verbal consent obtained.   Norva Riffle.Meka Lewan MSW, [place]  Holiday representative [facility] Care Management [phone]

## 2022-04-30 ENCOUNTER — Ambulatory Visit: Payer: Self-pay | Admitting: Licensed Clinical Social Worker

## 2022-04-30 NOTE — Patient Instructions (Signed)
Visit Information  Thank you for taking time to visit with me today. Please don't hesitate to contact me if I can be of assistance to you before our next scheduled telephone appointment.  Following are the goals we discussed today:   Our next appointment is by telephone on 05/07/22 at 11:00 AM  Please call the care guide team at [phone] if you need to cancel or reschedule your appointment.   If you are experiencing a Mental Health or [place] or need someone to talk to, please go to [facility] [place] [phone])   Following is a copy of your full plan of care:   Interventions: Talked with Anthoney Harada, daughter of client, about client needs Beverlee Nims and LCSW spoke of client monitoring her BP and sugar levels Diane and LCSW spoke of program support.for client Talked with Beverlee Nims about client support with PCP Discussed ambulation of client. Client uses a cane occasionally Discussed vision of client Discussed hearing needs of client. Beverlee Nims said client has hearing issues Discussed transportation needs of client. Client is still driving. Discussed sleeping issues of client. Beverlee Nims said client has sleeping difficultly Discussed with Beverlee Nims fluid retention of client  Discussed energy level of client  Ms. Loree was given information about Care Management services by the embedded care coordination team including:  Care Management services include personalized support from designated clinical staff supervised by her physician, including individualized plan of care and coordination with other care providers 24/7 contact phone numbers for assistance for urgent and routine care needs. The patient may stop CCM services at any time (effective at the end of the month) by phone call to the office staff.  Patient agreed to services and verbal consent obtained.   Norva Riffle.Krystian Ferrentino MSW, [place] Holiday representative [facility]  Care Management [phone]

## 2022-04-30 NOTE — Patient Outreach (Signed)
  Care Coordination   Follow Up Visit Note   04/30/2022 Name: Heather [name] MRN: [id number] DOB: 07-Oct-1934  Heather [name] is a 87 y.o. year old female who sees Heather Lukes, MD for primary care. I spoke  via phone today with Heather [name], daughter of client   What matters to the patients health and wellness today? Client wants to live independently as able and wants to see medical providers as needed    Goals Addressed             This Visit's Progress    Patient wants to try to live independently and wants to see medical providers as needed       Interventions: Talked with Heather [name], daughter of client, about client needs Heather [name] and LCSW spoke of client monitoring her BP and sugar levels Heather [name] and LCSW spoke of program support.for client Talked with Heather [name] about client support with PCP Discussed ambulation of client. Client uses a cane occasionally Discussed vision of client Discussed hearing needs of client. Heather [name] said client has hearing issues Discussed transportation needs of client. Client is still driving. Discussed sleeping issues of client. Heather [name] said client has sleeping difficultly Discussed with Heather [name] fluid retention of client  Discussed energy level of client        SDOH assessments and interventions completed:  Yes  SDOH Interventions Today    Flowsheet Row Most Recent Value  SDOH Interventions   Depression Interventions/Treatment  Currently on Treatment  Physical Activity Interventions Other (Comments)  [uses cane occasionally]  Stress Interventions Other (Comment)  [stress over managing medical needs.]        Care Coordination Interventions Activated:  Yes  Care Coordination Interventions:  Yes, provided   Follow up plan: Follow up call scheduled for 05/07/22 at 11:00 AM    Encounter Outcome:  Pt. Visit Completed

## 2022-05-07 ENCOUNTER — Ambulatory Visit: Payer: Self-pay | Admitting: Licensed Clinical Social Worker

## 2022-05-07 NOTE — Patient Instructions (Signed)
Visit Information  Thank you for taking time to visit with me today. Please don't hesitate to contact me if I can be of assistance to you before our next scheduled telephone appointment.  Following are the goals we discussed today:   Our next appointment is by telephone on 05/14/22 at 10:00 AM   Please call the care guide team at [phone] if you need to cancel or reschedule your appointment.   If you are experiencing a Mental Health or [place] or need someone to talk to, please go to [facility] [place] [phone])   Following is a copy of your full plan of care:   Interventions Discussed Care Coordination program support with client Discussed client needs. She said she drives as needed. She said she has support from her daughter.   Provided counseling support to client Reviewed ambulation of client. She said she can walk short distances. She has hip pain issues and thus has pain in walking longer distances. She uses a cane as needed to help her walk Reviewed sleeping difficulty issues. LCSW offered to schedule RN to call client to talk about her sleeping issues. Client did not agree for RN to call her at this time to talk about sleep issues Reviewed medication procurement. Discussed upcoming client appointments  Ms. Deupree was given information about Care Management services by the embedded care coordination team including:  Care Management services include personalized support from designated clinical staff supervised by her physician, including individualized plan of care and coordination with other care providers 24/7 contact phone numbers for assistance for urgent and routine care needs. The patient may stop CCM services at any time (effective at the end of the month) by phone call to the office staff.  Patient agreed to services and verbal consent obtained.   Norva Riffle.Gracynn Rajewski MSW, [place]  Holiday representative [facility] Care Management [phone]

## 2022-05-07 NOTE — Patient Outreach (Signed)
  Care Coordination   Follow Up Visit Note   05/07/2022 Name: DODIE PARISI MRN: [id number] DOB: 1935-01-25  MARNEY TRELOAR is a 87 y.o. year old female who sees Mosie Lukes, MD for primary care. I spoke with  [name] by phone today.  What matters to the patients health and wellness today? Client wants to drive as needed, wants to live independently in her home, wants to complete daily ADLs as needed    Goals Addressed               This Visit's Progress     Patient wants to continue to drive as needed, wants to continue to live indendpendently in her home. wants to compete daily ADLs, wants to be able to see medical providers as needed (pt-stated)        Interventions Discussed Care Coordination program support with client Discussed client needs. She said she drives as needed. She said she has support from her daughter.   Provided counseling support to client Reviewed ambulation of client. She said she can walk short distances. She has hip pain issues and thus has pain in walking longer distances. She uses a cane as needed to help her walk Reviewed sleeping difficulty issues. LCSW offered to schedule RN to call client to talk about her sleeping issues. Client did not agree for RN to call her at this time to talk about sleep issues Reviewed medication procurement. Discussed upcoming client appointments     SDOH assessments and interventions completed:  Yes     Care Coordination Interventions Activated:  Yes  Care Coordination Interventions:  Yes, provided   Follow up plan: Follow up call scheduled for 05/14/22 at 10:00 AM     Encounter Outcome:  Pt. Visit Completed

## 2022-05-09 ENCOUNTER — Other Ambulatory Visit: Payer: Self-pay | Admitting: Family Medicine

## 2022-05-09 DIAGNOSIS — G2581 Restless legs syndrome: Secondary | ICD-10-CM

## 2022-05-14 ENCOUNTER — Ambulatory Visit: Payer: Self-pay | Admitting: Licensed Clinical Social Worker

## 2022-05-14 ENCOUNTER — Encounter: Payer: Self-pay | Admitting: Licensed Clinical Social Worker

## 2022-05-14 NOTE — Patient Instructions (Signed)
Visit Information  Thank you for taking time to visit with me today. Please don't hesitate to contact me if I can be of assistance to you before our next scheduled telephone appointment.  Following are the goals we discussed today:   Our next appointment is by telephone on 06/04/22 at 1:30 PM   Please call the care guide team at [phone] if you need to cancel or reschedule your appointment.   If you are experiencing a Mental Health or [place] or need someone to talk to, please go to [facility] [place] [phone])   Following is a copy of your full plan of care:   Interventions LCSW called phone number today for daughter of client but LCSW was not able to speak via phone with daughter of client. LCSW did leave a phone message for daughter of client asking her to please call LCSW at [phone].  Ms. Scogin was given information about Care Management services by the embedded care coordination team including:  Care Management services include personalized support from designated clinical staff supervised by her physician, including individualized plan of care and coordination with other care providers 24/7 contact phone numbers for assistance for urgent and routine care needs. The patient may stop CCM services at any time (effective at the end of the month) by phone call to the office staff.  Patient agreed to services and verbal consent obtained.   Norva Riffle.Hildagard Sobecki MSW, [place] Holiday representative [facility] Care Management [phone]

## 2022-05-14 NOTE — Patient Outreach (Signed)
  Care Coordination   05/14/2022 Name: Heather [name] MRN: [id number] DOB: 05-Aug-1934   Care Coordination Outreach Attempts:  An unsuccessful telephone outreach was attempted today to offer the patient information about available care coordination services as a benefit of their health plan.   Follow Up Plan:  Additional outreach attempts will be made to offer the patient care coordination information and services.   Encounter Outcome:  No Answer  Care Coordination Interventions Activated:  Yes   Care Coordination Interventions:  Yes, provided    Norva Riffle.Money Mckeithan MSW, [place] Holiday representative [facility] Care Management [phone]

## 2022-05-28 IMAGING — CT CT ABD-PELV W/ CM
2 of 4 series · 10 of 42 positions shown, 16 images · IV contrast (omnipaque)
Comparison: Postoperative CT 01/28/2021

CLINICAL DATA: Abdominal pain. Gallbladder inter knee a surgery
720, recent hospital discharge. Drainage from incision and fever.

EXAM:
CT ABDOMEN AND PELVIS WITH CONTRAST
TECHNIQUE: Multidetector CT imaging of the abdomen and pelvis was performed
using the standard protocol following bolus administration of
intravenous contrast.
CONTRAST:  80mL OMNIPAQUE IOHEXOL 350 MG/ML SOLN

[Series 5: coronal st · coronal · 0.84mm/px · 3 of 149 slices shown, 4 images]
[im 50/149  soft-tissue]
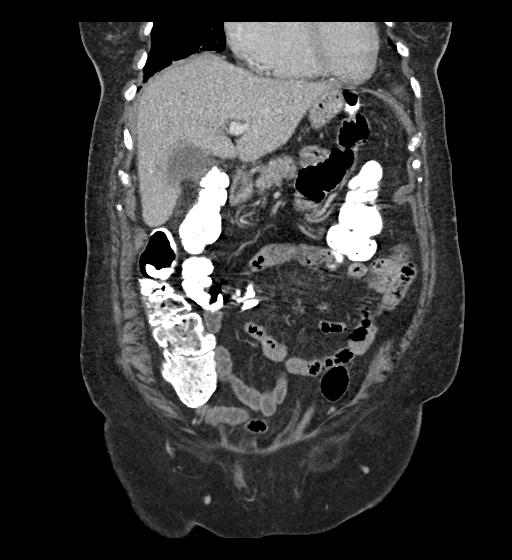
[im 66/149  soft-tissue]
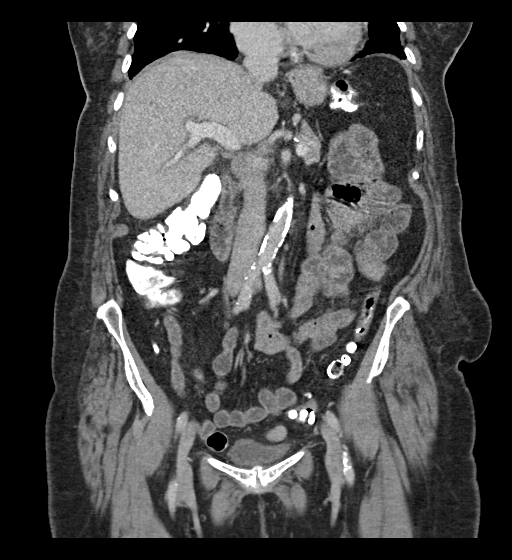
[im 66/149  bone]
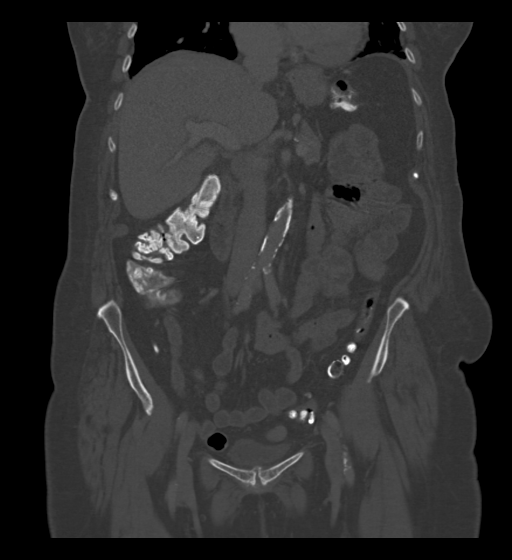
[im 83/149  soft-tissue]
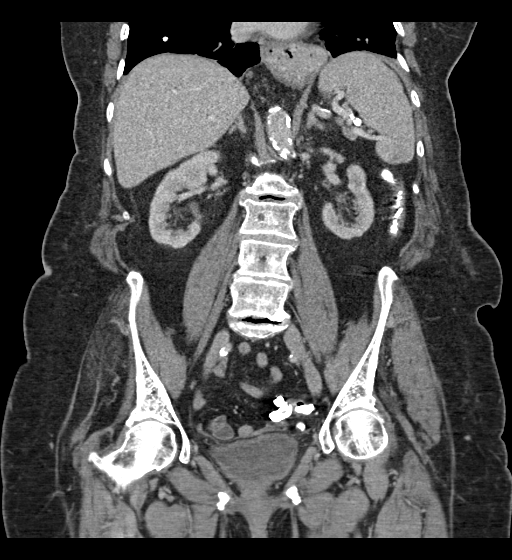

[Series 7: delay · axial · delayed · 0.71mm/px · z∈[-278,-188]mm · 7 of 26 slices shown, 12 images]
[im 4/26  soft-tissue]
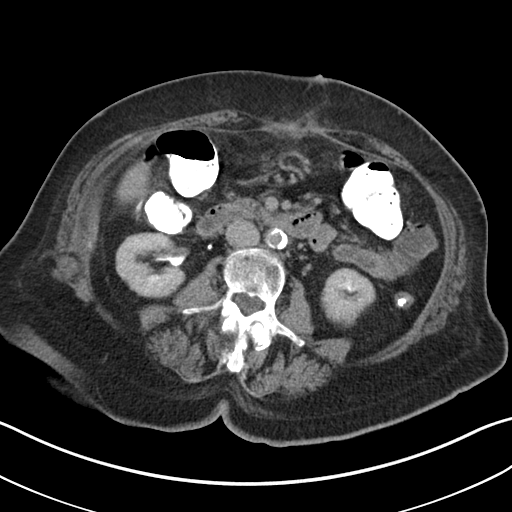
[im 4/26  bone]
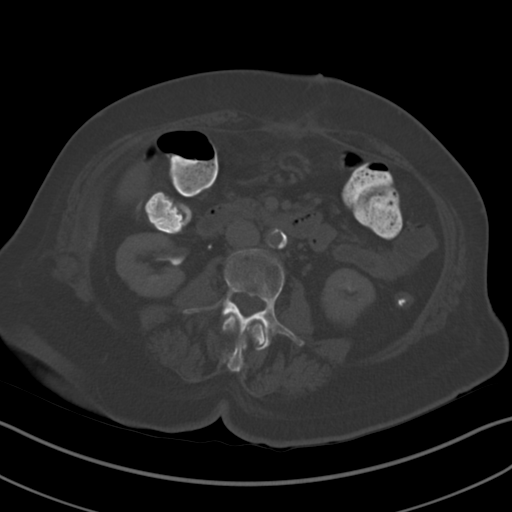
[im 7/26  soft-tissue]
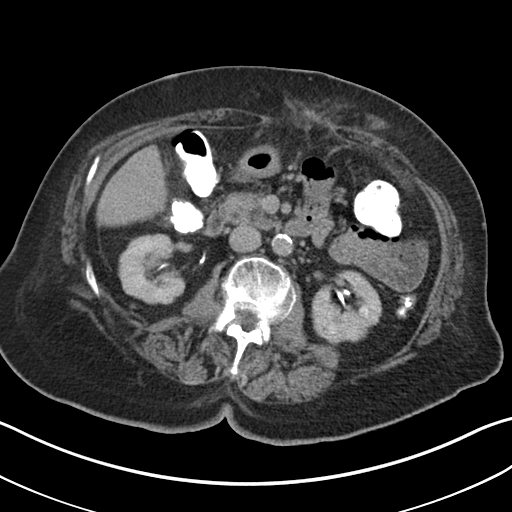
[im 10/26  soft-tissue]
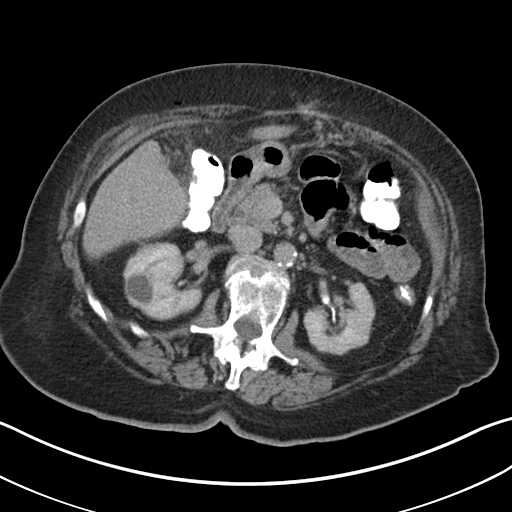
[im 13/26  soft-tissue]
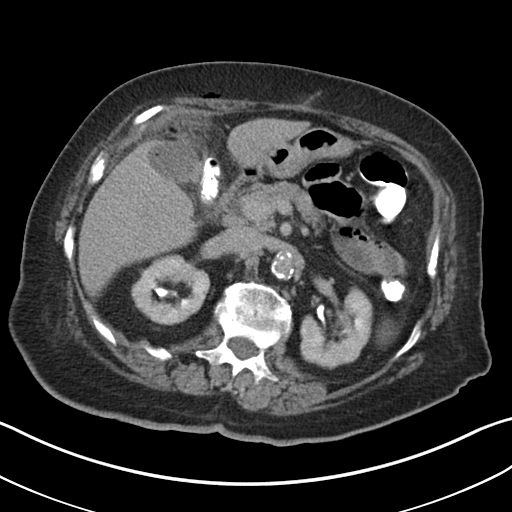
[im 13/26  lung]
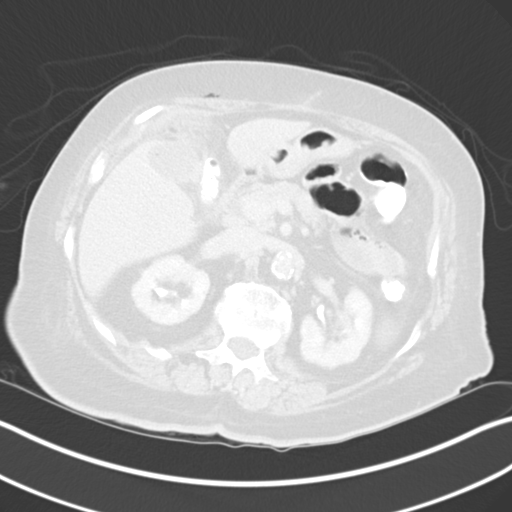
[im 16/26  soft-tissue]
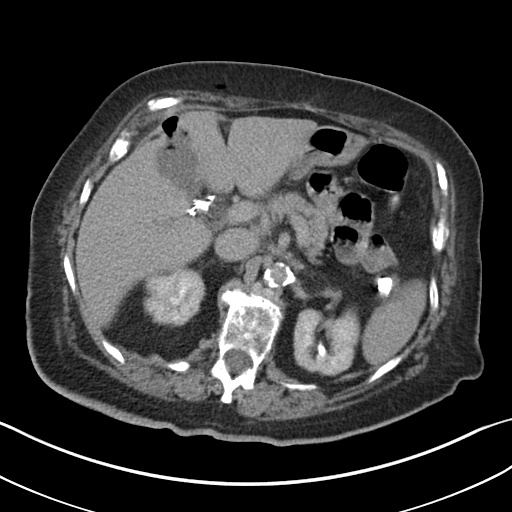
[im 16/26  lung]
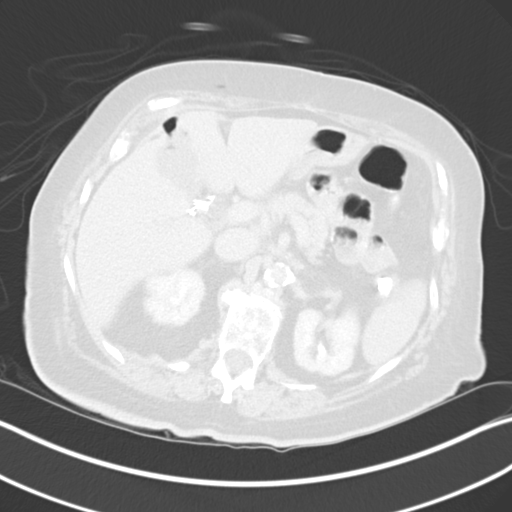
[im 19/26  soft-tissue]
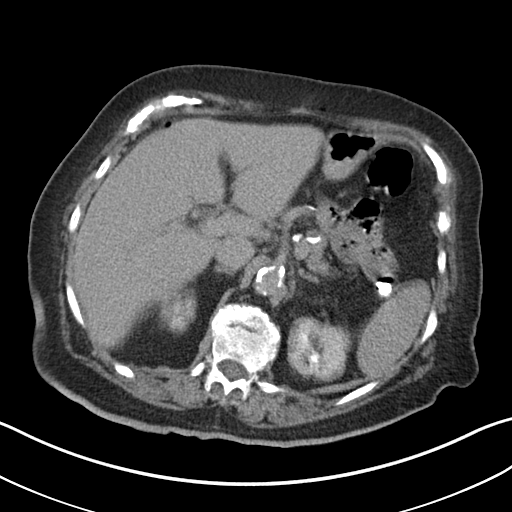
[im 19/26  lung]
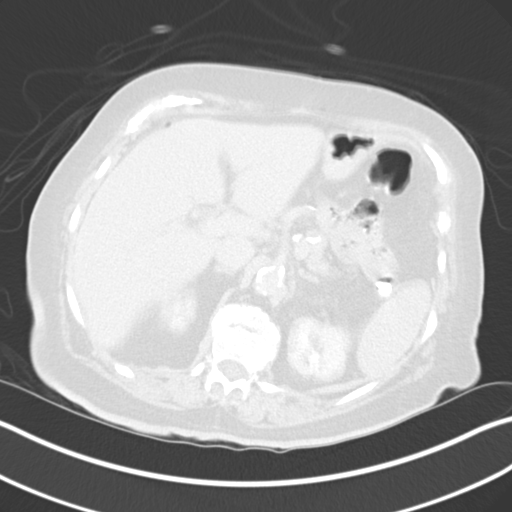
[im 22/26  soft-tissue]
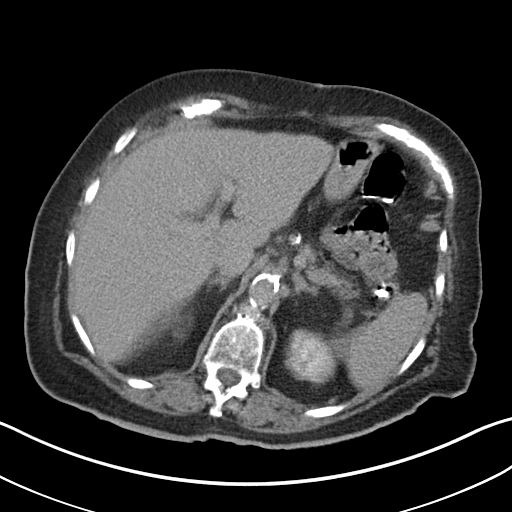
[im 22/26  lung]
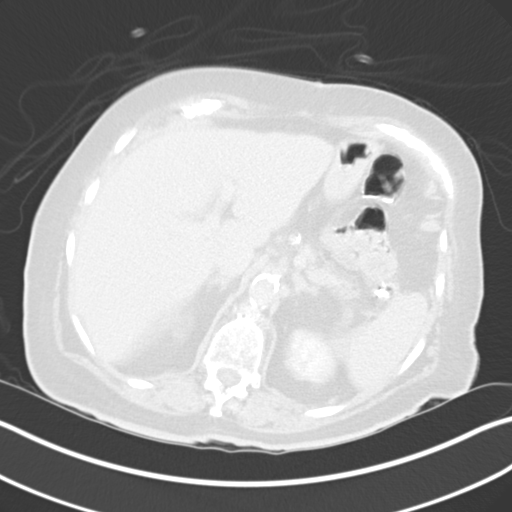

[10 of 42 positions shown; findings below may reference images not displayed]

FINDINGS: Lower chest: Diminished pleural effusions. Improving basilar
atelectasis, more so on the right. Stable moderate-sized hiatal
hernia. Coronary artery calcifications.

Hepatobiliary: Again seen low-density adjacent to the falciform
ligament. No intrahepatic fluid collection. Post recent
cholecystectomy. Increased size of fluid collection in the
gallbladder fossa currently measuring 4 cm, previously 3.3 cm.
Collection contains heterogeneous fluid and air, with small amount
of fluid and air tracking superficial to the anterior liver. Minimal
periportal edema without biliary dilatation. No common bile duct
dilatation.

Pancreas: No ductal dilatation or inflammation.

Spleen: Normal in size without focal abnormality.

Adrenals/Urinary Tract: Normal adrenal glands. No hydronephrosis or
renal calculi. Stable cysts in the right kidney. Lobulated renal
contours again seen. Urinary bladder is partially distended. Focus
of air in the urinary bladder likely related to prior Foley
catheter. Equivocal bladder wall thickening.

Stomach/Bowel: Moderate hiatal hernia, similar to prior.
Decompressed stomach. There is no small bowel obstruction or
inflammation. High-density barium within the colon from prior
esophagram. Normal appendix. Left colonic diverticulosis without
diverticulitis. No colonic inflammation.

Vascular/Lymphatic: Moderate aortic atherosclerosis. Patent portal
vein. No acute vascular findings. No enlarged lymph nodes in the
abdomen or pelvis

Reproductive: Status post hysterectomy. No adnexal masses.

Other: Trace air in the cholecystectomy bed that tracks anterior in
peripheral to the liver. Trace perihepatic fluid. Gallbladder fossa
fluid collection as described above. No other focal fluid
collection. No significant ascites/free fluid. There is postsurgical
change of the anterior abdominal wall. There is irregularity at the
skin surface with small foci of gas in the subcutaneous tissues but
no drainable collection. Expected stranding of the subcutaneous
tissues. Minimal stranding in the subjacent omental fat. Similar
appearance of fat containing hernia just above postsurgical change,
hernia neck spanning 4.4 cm transverse, series 2, image 32. Minimal
air in the anterior abdominal wall which is improving from prior
exam.

Musculoskeletal: Stable degenerative change in the spine. No acute
findings.
IMPRESSION: 1. Post recent cholecystectomy. Increased size of fluid collection
in the gallbladder fossa currently measuring 4 cm, previously
cm. Collection contains heterogeneous fluid and air, with small
amount of fluid and air tracking superficial to the anterior liver.
Differential considerations include in postoperative seroma, +/-
infection, or biloma.
2. Postsurgical change of the anterior abdominal wall with irregular
skin surface and small foci of gas in the subcutaneous tissues but
no drainable collection.
3. Similar appearance of fat containing hernia just above
postsurgical change.
4. Equivocal bladder wall thickening.
5. Colonic diverticulosis without diverticulitis.
6. Diminished pleural effusions and basilar atelectasis.
7. Moderate hiatal hernia.

Aortic Atherosclerosis ([id number]-KKJ.J).

## 2022-06-04 ENCOUNTER — Ambulatory Visit: Payer: Self-pay | Admitting: Licensed Clinical Social Worker

## 2022-06-04 NOTE — Patient Outreach (Signed)
  Care Coordination   06/04/2022 Name: Heather [name] MRN: [id number] DOB: 07-26-1934   Care Coordination Outreach Attempts:  An unsuccessful telephone outreach was attempted today to offer the patient information about available care coordination services as a benefit of their health plan.   Follow Up Plan:  Additional outreach attempts will be made to offer the patient care coordination information and services.   Encounter Outcome:  No Answer   Care Coordination Interventions:  No, not indicated    Norva Riffle.Alessia Gonsalez MSW, [place] Holiday representative [facility] Care Management [phone]

## 2022-06-07 ENCOUNTER — Ambulatory Visit (INDEPENDENT_AMBULATORY_CARE_PROVIDER_SITE_OTHER): Payer: Medicare HMO | Admitting: Pharmacist

## 2022-06-07 DIAGNOSIS — I10 Essential (primary) hypertension: Secondary | ICD-10-CM

## 2022-06-07 DIAGNOSIS — E785 Hyperlipidemia, unspecified: Secondary | ICD-10-CM

## 2022-06-07 DIAGNOSIS — E1169 Type 2 diabetes mellitus with other specified complication: Secondary | ICD-10-CM

## 2022-06-07 DIAGNOSIS — E1121 Type 2 diabetes mellitus with diabetic nephropathy: Secondary | ICD-10-CM

## 2022-06-07 NOTE — Progress Notes (Signed)
Pharmacy Note  06/07/2022 Name: [name] MRN: [id number] DOB: 1934/12/08  Subjective: Heather [name] is a 87 y.o. year old female who is a primary care patient of Heather Lukes, MD. Clinical Pharmacist Practitioner referral was placed to assist with medication management.    Engaged with patient by telephone for follow up visit today.  Type 2 DM:  Patient has not checked blood glucose at home recently. She is not currently taking pharmacotherapy for DM.  Hypertension:  Blood pressure today was 134/65 -home reading. Patient reports good adherence with blood pressure medications.  Low Vitamin D:  Patient did start vitamin D 50,000 units weekly 03/22/2022 for 3 months. She has continued to take over-the-counter vitamin D 2000 units daily. Due to recheck serum vitamin D at next visit.   SDOH (Social Determinants of Health) assessments and interventions performed:  SDOH Interventions    [place] Coordination from 05/07/2022 in [place] Coordination from 04/30/2022 in [place] Coordination from 04/24/2022 in [place] Management from 06/15/2021 in [place] at [place] Management from 03/14/2021 in [place] at [place] Management from 12/12/2020 in [place] at [place] Interventions        Food Insecurity Interventions -- -- -- -- Intervention Not Indicated --  Transportation Interventions -- -- -- -- Intervention Not Indicated --  Depression Interventions/Treatment  -- Currently on Treatment -- -- -- --  Financial Strain Interventions -- -- -- Intervention Not Indicated -- Intervention Not Indicated  Physical Activity Interventions Other (Comments)  [has hip pain in walking longer distances. uses  cane as needed] Other (Comments)  [uses cane occasionally] Other (Comments)  [may have some walking challenges] Other (Comments)  [discussed increasing exericse as able - goal of 150 minutes per week.] -- Other (Comments)  [patient is recovering from surgery,  has been advised not to exercise at this time.]  Stress Interventions Provide Counseling  [sleeping issues, stress in managing medical needs] Other (Comment)  [stress over managing medical needs.] Provide Counseling  [difficulty sleeping] -- -- --  Social Connections Interventions -- -- -- Other (Comment)  [Discussed increasing outings and social interactions like attending spiritual events. She does attend chruch on line.] -- --        Objective: Review of patient status, including review of consultants reports, laboratory and other test data, was performed as part of comprehensive.  Lab Results  Component Value Date   CREATININE 1.00 03/22/2022   CREATININE 1.03 10/20/2021   CREATININE 1.01 (H) 09/11/2021    Lab Results  Component Value Date   HGBA1C 7.1 (H) 03/22/2022       Component Value Date/Time   CHOL 141 03/22/2022 1239   TRIG 98.0 03/22/2022 1239   HDL 64.60 03/22/2022 1239   CHOLHDL 2 03/22/2022 1239   VLDL 19.6 03/22/2022 1239   LDLCALC 57 03/22/2022 1239   LDLDIRECT 73.4 05/20/2014 1651     Clinical ASCVD: Yes  The ASCVD Risk score (Arnett DK, et al., 2019) failed to calculate for the following reasons:   The 2019 ASCVD risk score is only valid for ages 60 to 12    BP Readings from Last 3 Encounters:  03/27/22 (!) 169/78  03/22/22 138/70  11/08/21 (!) 150/60     Allergies  Allergen Reactions  Baclofen Other (See Comments)    Hyperactivity    Gabapentin Other (See Comments)    Made her hyper   Nitrofurantoin Rash    Medications Reviewed Today     Reviewed by Jaquita Folds, MD (Physician) on 03/27/22 at 1308  Med List Status: <None>   Medication Order Taking? Sig Documenting  Provider Last Dose Status Informant  acetaminophen (TYLENOL) 500 MG tablet [id number] Yes Take 1,000 mg by mouth every 6 (six) hours as needed for moderate pain. [provider] Taking Active Self  amLODipine (NORVASC) 10 MG tablet [id number] Yes TAKE 1 TABLET EVERY DAY Heather Lukes, MD Taking Active            Med Note Antony Contras, [place] B   Thu Feb 01, 2022  2:31 PM) Takes at night  Ascorbic Acid (VITAMIN C) 500 MG CHEW [id number] Yes Chew 500 mg by mouth daily. [provider] Taking Active Self           Med Note Jonathon Jordan Jun 15, 2021 12:06 PM)    aspirin EC 81 MG tablet [id number] Yes Take 1 tablet (81 mg total) by mouth daily. [name], MD Taking Active Self  Blood Glucose Monitoring Suppl (TRUE METRIX AIR GLUCOSE METER) w/Device KIT [id number] Yes USE TO CHECK BLOOD SUGAR ONCE DAILY AND AS NEEDED.  DX CODE E11.9 Heather Lukes, MD Taking Active Self  Cholecalciferol (VITAMIN D) 50 MCG (2000 UT) CAPS [id number] Yes Take 2,000 Units by mouth daily.  [provider] Taking Active Self  diclofenac Sodium (VOLTAREN) 1 % GEL [id number] Yes Apply 1 application topically 4 (four) times daily as needed (hip pain). [provider] Taking Active Self  dicyclomine (BENTYL) 20 MG tablet [id number] Yes Take 1 tablet (20 mg total) by mouth 2 (two) times daily as needed for spasms (abdominal cramping). Mesner, Corene Cornea, MD Taking Active Self  estradiol (ESTRACE) 0.1 MG/GM vaginal cream [id number] Yes Place 0.5 g vaginally 2 (two) times a week. Place 0.5g nightly for two weeks then twice a week after Jaquita Folds, MD Taking Active   famotidine (PEPCID) 20 MG tablet [id number] Yes Take 1 tablet (20 mg total) by mouth at bedtime as needed for heartburn or indigestion. Heather Lukes, MD Taking Active   furosemide (LASIX) 20 MG tablet [id number] Yes Take 1 tablet (20 mg total) by mouth daily.  Patient taking differently: Take 20 mg by mouth daily as needed for  edema.   Leeroy Bock, RPH-CPP Taking Active            Med Note Vita Barley Apr 17, 2021  7:54 AM)    glucose blood (TRUE METRIX BLOOD GLUCOSE TEST) test strip [id number] Yes TEST BLOOD SUGAR EVERY DAY  OR AS NEEDED Heather Lukes, MD Taking Active   isosorbide mononitrate (IMDUR) 60 MG 24 hr tablet [id number] Yes TAKE 1 AND 1/2 TABLETS EVERY Lacie Scotts, MD Taking Active   losartan (COZAAR) 100 MG tablet [id number] Yes TAKE 1 TABLET (100 MG TOTAL) BY MOUTH DAILY.  Patient taking differently: Take 50 mg by mouth 2 (two) times daily.   [name], MD Taking Active   magnesium oxide (MAG-OX) 400 MG tablet [id number] Yes Take 400 mg by mouth at bedtime. [provider] Taking Active Self           Med Note Vita Barley Apr 17, 2021  7:56 AM)  metoprolol succinate (TOPROL-XL) 25 MG 24 hr tablet [id number] Yes Take 0.5 tablets (12.5 mg total) by mouth daily. Heather Lukes, MD Taking Active   omeprazole (PRILOSEC) 20 MG capsule [id number] Yes TAKE 1 CAPSULE EVERY MORNING Heather Lukes, MD Taking Active   ondansetron (ZOFRAN-ODT) 4 MG disintegrating tablet [id number] Yes Take 4 mg by mouth every 8 (eight) hours as needed for nausea or vomiting. [provider] Taking Active Self  pramipexole (MIRAPEX) 0.25 MG tablet [id number] Yes TAKE 1 TABLET TWICE DAILY Ann Held, DO Taking Active   simvastatin (ZOCOR) 20 MG tablet [id number] Yes TAKE 1 TABLET AT BEDTIME [name], MD Taking Active            Med Note Vita Barley Apr 17, 2021  8:01 AM)    traMADol (ULTRAM) 50 MG tablet [id number] Yes Take 1 tablet (50 mg total) by mouth every 6 (six) hours as needed for severe pain. Heather Lukes, MD Taking Active   TRUEplus Lancets 33G MISC [id number] Yes USE TO CHECK BLOOD SUGAR ONCE DAILY AND AS NEEDED.  DX CODE: E11.9 Heather Lukes, MD Taking Active Self  Vibegron 75 MG TABS [id number]  Take 75 mg by mouth daily. Jaquita Folds, MD  Active   Vitamin D, Ergocalciferol, (DRISDOL) 1.25 MG (50000 UNIT) CAPS capsule [id number] Yes Take 1 capsule (50,000 Units total) by mouth every 7 (seven) days. Heather Lukes, MD Taking Active   Zinc 50 MG TABS [id number] Yes Take 50 mg by mouth daily. [provider] Taking Active Self            Patient Active Problem List   Diagnosis Date Noted   Vitamin D deficiency 03/25/2022   Hypocalcemia 10/22/2021   Disorder of vitamin B12 10/22/2021   Aortic atherosclerosis ([place]) 10/12/2021   Peroneal tendinitis of right lower extremity 07/13/2021   SBO (small bowel obstruction) ([place]) 04/16/2021   Inguinal hernia of right side with obstruction 04/16/2021   Dysphagia 02/16/2021   Tremor 02/16/2021   AKI (acute kidney injury) ([place]) 02/05/2021   (HFpEF) heart failure with preserved ejection fraction ([place]) 02/05/2021   UTI (urinary tract infection) 01/31/2021   Intra-abdominal fluid collection 01/28/2021   Incarcerated incisional hernia s/p primary repair 01/25/2021 01/25/2021   Stress reaction of bone 01/05/2021   Pain of right sternoclavicular joint 01/05/2021   Screening for osteoporosis 01/05/2021   RUQ pain 12/01/2020   Chronic calculous cholecystitis s/p lap cholecystectomy 01/25/2021 12/01/2020   Lumbar radiculopathy 07/14/2020   Pelvic relaxation due to rectocele 04/06/2020   Prolapse of female pelvic organs 12/22/2019   Right hip pain 10/13/2019   Urinary incontinence, mixed 10/01/2019   Pelvic floor relaxation 06/08/2019   Deep venous thrombosis ([place]) 03/19/2019   Goiter 03/19/2019   Transient ischemic attack 03/19/2019   Acute dermatitis 03/19/2019   Pelvic prolapse 03/08/2019   Pain of breast 01/29/2019   Overactive bladder 01/29/2019   Educated about COVID-19 virus infection 12/02/2018   Hyperlipidemia associated with type 2 diabetes mellitus ([place]) 05/27/2017   Headache 04/17/2015   Multinodular goiter 01/17/2015   S/P total knee arthroplasty  11/29/2014   Insomnia 02/05/2014   Preventative health care 11/22/2013   RLS (restless legs syndrome) 10/04/2013   Lower urinary tract infectious disease 10/04/2013   Cataracts, bilateral 04/02/2013   Hypokalemia 01/05/2012   Anemia 01/05/2012   Mixed anxiety and depressive disorder 01/05/2012   Postoperative anemia due to acute blood loss 11/15/2011  Staphylococcus aureus carrier 11/15/2011   Pre-syncope 10/13/2011   DM (diabetes mellitus) ([place]) 10/06/2011   Pulmonary nodule 10/06/2011   Epigastric pain 10/06/2011   It band syndrome, right 08/28/2011   Renal insufficiency 08/28/2011   Coronary artery disease involving native coronary artery of native heart without angina pectoris 03/07/2010   FATIGUE 01/18/2009   PERSISTENT DISORDER INITIATING/MAINTAINING SLEEP 09/09/2008   DERMATITIS 10/16/2007   PERIPHERAL EDEMA 10/16/2007   Essential hypertension 04/14/2007   Gastroesophageal reflux disease with hiatal hernia 04/14/2007     Medication Assistance:  None required.  Patient affirms current coverage meets needs.   Assessment / Plan: Type 2 DM:  Encouraged patient to check blood glucose 3 to 4 times per week.  Reviewed blood glucose goals.  Continue low CHO / diabetic diet.   Hypertension: Last reading in our office was at goal but blood pressure was elevated when she saw Dr Wannetta Sender  Continue to check blood pressure 2 to 3 times per week.  Continue current medications for blood pressure - metoprolol ER, amlodipine and losartan. Hyperlipidemia: LDL goal< 70 Continue simvastatin 74m daily.  Medication management:  Reviewed and updated medication list Reviewed refill history and adherence Health Maintenance:  Updated vaccine records to include recent vaccinations at [facility] - Spikevax / CTekoaand Arexvy / RSV  Follow Up:  No further follow up required: patient is aware she can contact Clinical Pharmacist Practitioner if any medication questions or concerns arise.    TCherre Robins PharmD Clinical Pharmacist [place] [phone]

## 2022-06-07 NOTE — Patient Instructions (Signed)
Heather [name] It was a pleasure speaking with you today.  Below is a summary of your health goals and summary of our recent visit.   Type 2 DM:  check blood glucose 3 to 4 times per week.  Home blood glucose goals  Fasting blood glucose goal (before meals) = 80 to 130 Blood glucose goal after a meal = less than 180    Hypertension: blood pressure goal < 130/80 Continue to check blood pressure 2 to 3 times per week.  Continue current medications for blood pressure - metoprolol ER, amlodipine and losartan.  Hyperlipidemia: LDL goal< 70 Continue simvastatin '20mg'$  daily.   As always if you have any questions or concerns especially regarding medications, please feel free to contact me either at the phone number below or with a MyChart message.   Keep up the good work!  [name], PharmD Clinical Pharmacist [place] [phone] (direct line)  [phone] (main office number)   Patient verbalizes understanding of instructions and care plan provided today and agrees to view in [place]. Active MyChart status and patient understanding of how to access instructions and care plan via MyChart confirmed with patient.

## 2022-06-26 ENCOUNTER — Ambulatory Visit: Payer: Self-pay | Admitting: Licensed Clinical Social Worker

## 2022-06-26 NOTE — Patient Outreach (Signed)
  Care Coordination   Follow Up Visit Note   06/26/2022 Name: Heather [name] MRN: [id number] DOB: [birth year]  Heather [name] is a 87 y.o. year old female who sees Heather Lukes, MD for primary care. I spoke with  Heather [name] by phone today.  What matters to the patients health and wellness today? Patient wants to drive as needed, wants to live independently in her home and wants to complete daily ADLs. She wants to see medical providers as needed    Goals Addressed               This Visit's Progress     Patient wants to continue to drive as needed, wants to continue to live indendpendently in her home. wants to compete daily ADLs, wants to be able to see medical providers as needed (pt-stated)        Interventions  LCSW spoke via phone with client today about client needs Client and LCSW spoke of her sleeping challenges. She continues to have challenges with sleeping.  Spoke of meal intake and appetite of client. Spoke of transport needs. She drives herself to appointments and to complete errands Discussed medication procurement.  Discussed upcoming client appointments. She said she has appointment  in February of 2024 with cardiologist Reviewed tremor issues. She said she is managing tremor issues. Reviewed hearing issues. She said she has some hearing loss.  Reviewed ambulation needs. She is not using a device to help her walk Provided counseling support. Reminded client of program support with RN and Pharmacist as needed.     SDOH assessments and interventions completed:  Yes  SDOH Interventions Today    Flowsheet Row Most Recent Value  SDOH Interventions   Depression Interventions/Treatment  Counseling  Physical Activity Interventions Other (Comments)  [client may have low energy on occasion]  Stress Interventions Provide Counseling  [client has stress related to managing medical needs]        Care Coordination Interventions:  Yes, provided   Follow up plan:  Follow up call scheduled for 08/07/22 at 10:00 AM    Encounter Outcome:  Pt. Visit Completed   Heather [name] MSW, [place] Holiday representative [facility] Care Management [phone]

## 2022-06-26 NOTE — Patient Instructions (Signed)
Visit Information  Thank you for taking time to visit with me today. Please don't hesitate to contact me if I can be of assistance to you before our next scheduled telephone appointment.  Following are the goals we discussed today:   Our next appointment is by telephone on 08/07/22 at 10:00 AM   Please call the care guide team at [phone] if you need to cancel or reschedule your appointment.   If you are experiencing a Mental Health or [place] or need someone to talk to, please go to [facility] [phone])   Following is a copy of your full plan of care:   Interventions  LCSW spoke via phone with client today about client needs Client and LCSW spoke of her sleeping challenges. She continues to have challenges with sleeping.  Spoke of meal intake and appetite of client. Spoke of transport needs. She drives herself to appointments and to complete errands Discussed medication procurement.  Discussed upcoming client appointments. She said she has appointment  in February of 2024 with cardiologist Reviewed tremor issues. She said she is managing tremor issues. Reviewed hearing issues. She said she has some hearing loss.  Reviewed ambulation needs. She is not using a device to help her walk Provided counseling support. Reminded client of program support with RN and Pharmacist as needed.  Ms. Hulick was given information about Care Management services by the embedded care coordination team including:  Care Management services include personalized support from designated clinical staff supervised by her physician, including individualized plan of care and coordination with other care providers 24/7 contact phone numbers for assistance for urgent and routine care needs. The patient may stop CCM services at any time (effective at the end of the month) by phone call to the office staff.  Patient agreed to  services and verbal consent obtained.   Norva Riffle.Klayten Jolliff MSW, [place] Holiday representative [facility] Care Management [phone]

## 2022-07-04 DIAGNOSIS — M79671 Pain in right foot: Secondary | ICD-10-CM | POA: Diagnosis not present

## 2022-07-11 ENCOUNTER — Other Ambulatory Visit: Payer: Self-pay | Admitting: Family Medicine

## 2022-07-18 DIAGNOSIS — S86391A Other injury of muscle(s) and tendon(s) of peroneal muscle group at lower leg level, right leg, initial encounter: Secondary | ICD-10-CM | POA: Diagnosis not present

## 2022-07-21 ENCOUNTER — Other Ambulatory Visit: Payer: Self-pay | Admitting: Cardiovascular Disease

## 2022-08-02 ENCOUNTER — Encounter: Payer: Self-pay | Admitting: Cardiovascular Disease

## 2022-08-02 ENCOUNTER — Ambulatory Visit: Payer: Medicare HMO | Attending: Cardiovascular Disease | Admitting: Cardiovascular Disease

## 2022-08-02 VITALS — BP 140/70 | HR 65 | Ht 62.0 in | Wt 152.4 lb

## 2022-08-02 DIAGNOSIS — I5032 Chronic diastolic (congestive) heart failure: Secondary | ICD-10-CM | POA: Diagnosis not present

## 2022-08-02 DIAGNOSIS — E782 Mixed hyperlipidemia: Secondary | ICD-10-CM

## 2022-08-02 DIAGNOSIS — I10 Essential (primary) hypertension: Secondary | ICD-10-CM | POA: Diagnosis not present

## 2022-08-02 DIAGNOSIS — I2510 Atherosclerotic heart disease of native coronary artery without angina pectoris: Secondary | ICD-10-CM | POA: Diagnosis not present

## 2022-08-02 NOTE — Patient Instructions (Signed)
Medication Instructions:  Your physician recommends that you continue on your current medications as directed. Please refer to the Current Medication list given to you today.  *If you need a refill on your cardiac medications before your next appointment, please call your pharmacy*   Lab Work: NONE If you have labs (blood work) drawn today and your tests are completely normal, you will receive your results only by: [place] (if you have MyChart) OR A paper copy in the mail If you have any lab test that is abnormal or we need to change your treatment, we will call you to review the results.   Testing/Procedures: NONE   Follow-Up: At [facility], you and your health needs are our priority.  As part of our continuing mission to provide you with exceptional heart care, we have created designated Provider Care Teams.  These Care Teams include your primary Cardiologist (physician) and Advanced Practice Providers (APPs -  Physician Assistants and Nurse Practitioners) who all work together to provide you with the care you need, when you need it.  We recommend signing up for the patient portal called "MyChart".  Sign up information is provided on this After Visit Summary.  MyChart is used to connect with patients for Virtual Visits (Telemedicine).  Patients are able to view lab/test results, encounter notes, upcoming appointments, etc.  Non-urgent messages can be sent to your provider as well.   To learn more about what you can do with MyChart, go to [email or web address].    Your next appointment:   6 months w/APP, then 1 year with:  Provider:   [name], MD

## 2022-08-02 NOTE — Progress Notes (Signed)
Cardiology Office Note:    Date:  08/07/2022   ID:  Heather [name], DOB [birth year], MRN [id number]  PCP:  Mosie Lukes, MD   [place] Providers Cardiologist:  [name], MD Cardiology APP:  Sharmon Revere     Referring MD: Mosie Lukes, MD   Chief Complaint  Patient presents with   Coronary Artery Disease    History of Present Illness:    Heather [name] is a 88 y.o. female with a hx of: Coronary artery disease  Jaw pain=anginal equiv S/p Taxus DES to LCx 9/06 S/p Taxus DES to RCA 12/06 Residual dz 12/06: mLAD 70-80 Aortic atherosclerosis  (HFpEF) heart failure with preserved ejection fraction  Borderline diabetes mellitus  GERD  Hypertension  Hyperlipidemia  Hx of CVA Hx of post op DVT (after TKR) in 2016; Rivaroxaban x 6 mos Fatigue, palpitations 3/22 Echocardiogram 2/22: EF 55-60 Myoview 2/22: EF 67, low risk  Event monitor 3/22: no sig arrhythmia  Rx w/ beta-blocker  Bifascicular block  The patient is here with her daughter today.   Past Medical History:  Diagnosis Date   Aortic atherosclerosis ([place]) 10/12/2021   Chronic stable angina    Coronary artery disease cardiologist--- dr Burt Knack   hx NSTEMI  w/ cardiac cath 03-16-2005 PCI with DES to LCx;   cardiac cath 06-27-2005  PCI with DES to RCA with residual dx LAD manage medcially;  lexiscan 01-26-20211 normal no ischemia, ef 70%;  stress echo w/ dobutamine 10-24-2011 negative ishcmeia , normal ef // Myoview 2/22: EF 67, no ischemia or infarction, no TID, low risk    Diabetes mellitus type 2, diet-controlled ([place])    followed by pcp  (10-19-2020  pt stated checks daily in am,  fasting blood surgar--- 115--120s)   DOE (dyspnea on exertion)    per pt "when I over do",  ok with household chores   Echocardiogram 08/2020    Echocardiogram 2/22: EF 55-60, no RWMA, mild LVH, Gr 2 DD, GLS-21.7%, normal RVSF, trivial MR, RVSP 39.5   Edema of right lower extremity    GERD (gastroesophageal  reflux disease)    Hiatal hernia    recurrence,  hx HH repair 1989   History of cervical cancer    s/p  vaginal hysterectomy   History of DVT of lower extremity 2016   11-29-2014 post op right TKA of right lower extremity and completed xarelto    History of esophageal stricture    hx s/p dilatation's   History of gastric ulcer 2005 approx.   History of palpitations 2010   event monitor 07-07-2009 showed NSR w/ freq. SVT ectopies with short runs, rare PVCs   History of TIA (transient ischemic attack) 06/1999   12-15-2019  per pt had several TIA between 12/ 2000 to 02/ 2001 , was sent to specialist [facility] , had test that was normal (10-19-2020 pt stated no TIAs since ) but has residual of essential tremors of right arm/ hand   Hypertension    Intermittent palpitations    IT band syndrome    Migraine    "ice pick headche lasts about 30 seconds"   Mixed hyperlipidemia    Mixed incontinence urge and stress    Multiple thyroid nodules    followed by pcp---   ultrasound 11-22-2014 no bx   (12-15-2019 per pt had a endocrinologist and was told did not need bx)   OA (osteoarthritis)    knees, elbow, hip, ankles   Occasional tremors  right arm/ hand  s/p TIA residual 2000   Osteoporosis    taking vitamin d   Peroneal DVT (deep venous thrombosis) ([place]) 12/22/2014   Right bundle branch block (RBBB) with left anterior fascicular block (LAFB)    RLS (restless legs syndrome)    S/P drug eluting coronary stent placement 2006   03-16-2005  PCI x1 DES to LCx;   06-27-2005  PCI x1 DES to RCA   Urinary retention    post op sling prodecure on 10-27-2020, has foley cathether    Past Surgical History:  Procedure Laterality Date   ANTERIOR AND POSTERIOR REPAIR N/A 12/22/2019   Procedure: ANTERIOR (CYSTOCELE)  REPAIR;  Surgeon: Janyth Contes, MD;  Location: [facility];  Service: Gynecology;  Laterality: N/A;   ANTERIOR AND POSTERIOR REPAIR WITH SACROSPINOUS FIXATION N/A  10/27/2020   Procedure: SACROSPINOUS LIGAMENT FIXATION;  Surgeon: Jaquita Folds, MD;  Location: [facility];  Service: Gynecology;  Laterality: N/A;   BLADDER SUSPENSION N/A 10/27/2020   Procedure: TRANSVAGINAL TAPE (TVT) PROCEDURE;  Surgeon: Jaquita Folds, MD;  Location[facility];  Service: Gynecology;  Laterality: N/A;   CATARACT EXTRACTION W/ INTRAOCULAR LENS  IMPLANT, BILATERAL  2015   CHOLECYSTECTOMY N/A 01/25/2021   Procedure: LAPAROSCOPIC CHOLECYSTECTOMY WITH INTRAOPERATIVE CHOLANGIOGRAM AND LYSIS OF ADHESIONS;  Surgeon: Nancy Manuele Boston, MD;  Location: [facility];  Service: General;  Laterality: N/A;   COLONOSCOPY  last one ?   CORONARY ANGIOPLASTY WITH STENT PLACEMENT  03-16-2005   dr Lia Foyer   PCI and DES x1 to LCx   CORONARY ANGIOPLASTY WITH STENT PLACEMENT  06-27-2005  dr Lia Foyer   PCI and DES x1 to RCA with residual disease LAD 70-80% to manage medically   CYSTOSCOPY N/A 10/27/2020   Procedure: CYSTOSCOPY;  Surgeon: Jaquita Folds, MD;  [facility];  Service: Gynecology;  Laterality: N/A;   CYSTOSCOPY N/A 11/09/2020   Procedure: CYSTOSCOPY;  Surgeon: Jaquita Folds, MD;  Location: [facility];  Service: Gynecology;  Laterality: N/A;   FOOT SURGERY Left 1990s   left foot stress fracture repair, per pt no hardware   HIATAL HERNIA REPAIR  1989   INCISIONAL HERNIA REPAIR N/A 01/25/2021   Procedure: PRIMARY REPAIR OF INCISIONAL HERNIA;  Surgeon: Jinny Sweetland Boston, MD;  Location: [facility];  Service: General;  Laterality: N/A;   INGUINAL HERNIA REPAIR Left 04/17/2021   Procedure: LAPAROSCOPIC LEFT INGUINAL HERNIA REPAIR WITH MESH;  Surgeon: Ralene Ok, MD;  Location: [facility];  Service: General;  Laterality: Left;   KNEE ARTHROSCOPY Bilateral right ?/   left x2 , last one 09-12-2009 @ [facility]   PUBOVAGINAL SLING N/A 11/09/2020   Procedure: REVISION OF PUBO-VAGINAL SLING;  Surgeon: Jaquita Folds,  MD;  Location[facility];  Service: Gynecology;  Laterality: N/A;   RECTOCELE REPAIR N/A 04/20/2020   Procedure: POSTERIOR REPAIR (RECTOCELE);  Surgeon: Janyth Contes, MD;  Location: [facility];  Service: Gynecology;  Laterality: N/A;   RECTOCELE REPAIR N/A 10/27/2020   Procedure: POSTERIOR REPAIR (RECTOCELE);  Surgeon: Jaquita Folds, MD;  Location: [facility];  Service: Gynecology;  Laterality: N/A;  total time requested for all procedures is 2 hours   TOTAL KNEE ARTHROPLASTY  11/12/2011   Procedure: TOTAL KNEE ARTHROPLASTY;  Surgeon: Lorn Junes, MD;  Location: [facility];  Service: Orthopedics;  Laterality: Left;  Dr Noemi Chapel wants 90 minutes for this case   TOTAL KNEE ARTHROPLASTY  Right 11/29/2014   Procedure: RIGHT TOTAL KNEE ARTHROPLASTY;  Surgeon: Vickey Huger, MD;  Location: [facility];  Service: Orthopedics;  Laterality: Right;   UPPER GASTROINTESTINAL ENDOSCOPY  last one 04-25-2017   with dilatation esophageal stricture and savary dilatation   VAGINAL HYSTERECTOMY  1988    no ovaries removed for bleeeding    Current Medications: Current Meds  Medication Sig   acetaminophen (TYLENOL) 500 MG tablet Take 1,000 mg by mouth every 6 (six) hours as needed for moderate pain.   amLODipine (NORVASC) 10 MG tablet TAKE 1 TABLET EVERY DAY   Ascorbic Acid (VITAMIN C) 500 MG CHEW Chew 500 mg by mouth daily.   aspirin EC 81 MG tablet Take 1 tablet (81 mg total) by mouth daily.   Blood Glucose Monitoring Suppl (TRUE METRIX AIR GLUCOSE METER) w/Device KIT USE TO CHECK BLOOD SUGAR ONCE DAILY AND AS NEEDED.  DX CODE E11.9   Cholecalciferol (VITAMIN D) 50 MCG (2000 UT) CAPS Take 2,000 Units by mouth daily.    diclofenac Sodium (VOLTAREN) 1 % GEL Apply 1 application topically 4 (four) times daily as needed (hip pain).   estradiol (ESTRACE) 0.1 MG/GM vaginal cream Place 0.5 g vaginally 2 (two) times a week. Place 0.5g nightly for two weeks then twice a  week after   famotidine (PEPCID) 20 MG tablet TAKE 1 TABLET AT BEDTIME AS NEEDED FOR HEARTBURN OR INDIGESTION.   furosemide (LASIX) 20 MG tablet Take 1 tablet (20 mg total) by mouth daily. (Patient taking differently: Take 20 mg by mouth daily as needed for edema.)   glucose blood (TRUE METRIX BLOOD GLUCOSE TEST) test strip TEST BLOOD SUGAR EVERY DAY  OR AS NEEDED (Patient taking differently: TEST BLOOD SUGAR EVERY DAY  OR AS NEEDED)   isosorbide mononitrate (IMDUR) 60 MG 24 hr tablet TAKE 1 AND 1/2 TABLETS EVERY DAY   losartan (COZAAR) 100 MG tablet TAKE 1 TABLET (100 MG TOTAL) BY MOUTH DAILY. (Patient taking differently: Take 100 mg by mouth 2 (two) times daily. Per patient taking 1/2 tablet 50 mg of 100 mg tablet)   magnesium oxide (MAG-OX) 400 MG tablet Take 400 mg by mouth at bedtime.   metoprolol succinate (TOPROL-XL) 25 MG 24 hr tablet Take 0.5 tablets (12.5 mg total) by mouth daily.   omeprazole (PRILOSEC) 20 MG capsule TAKE 1 CAPSULE EVERY DAY AS NEEDED (NEED OFFICE VISIT BEFORE ANY FURTHER REFILLS)   pramipexole (MIRAPEX) 0.25 MG tablet Take 1 tablet (0.25 mg total) by mouth 2 (two) times daily.   simvastatin (ZOCOR) 20 MG tablet TAKE 1 TABLET AT BEDTIME   TRUEplus Lancets 33G MISC USE TO CHECK BLOOD SUGAR ONCE DAILY AND AS NEEDED.  DX CODE: E11.9   Vitamin D, Ergocalciferol, (DRISDOL) 1.25 MG (50000 UNIT) CAPS capsule Take 1 capsule (50,000 Units total) by mouth every 7 (seven) days.   Zinc 50 MG TABS Take 50 mg by mouth daily.   [DISCONTINUED] traMADol (ULTRAM) 50 MG tablet Take 1 tablet (50 mg total) by mouth every 6 (six) hours as needed for severe pain.   [DISCONTINUED] Vibegron 75 MG TABS Take 75 mg by mouth daily.     Allergies:   Baclofen, Gabapentin, and Nitrofurantoin   Social History   Socioeconomic History   Marital status: Widowed    Spouse name: Not on file   Number of children: 3   Years of education: 24   Highest education level: Not on file  Occupational History    Occupation: works partime in  office    Employer: RETIRED    Comment: retired  Tobacco Use   Smoking status: Never   Smokeless tobacco: Never  Vaping Use   Vaping Use: Never used  Substance and Sexual Activity   Alcohol use: No    Alcohol/week: 0.0 standard drinks of alcohol   Drug use: Never   Sexual activity: Not Currently    Birth control/protection: Surgical    Comment: lives alone  Other Topics Concern   Not on file  Social History Narrative   Lives with husband, Caffeine use- Half Caffeine)- 2 cups daily.  3 children living, one passed away.   Education: HS. Business college.  Retired.    Social Determinants of Health   Financial Resource Strain: Low Risk  (11/28/2021)   Overall Financial Resource Strain (CARDIA)    Difficulty of Paying Living Expenses: Not hard at all  Food Insecurity: No Food Insecurity (11/28/2021)   Hunger Vital Sign    Worried About Running Out of Food in the Last Year: Never true    Ran Out of Food in the Last Year: Never true  Transportation Needs: No Transportation Needs (06/07/2022)   PRAPARE - Hydrologist (Medical): No    Lack of Transportation (Non-Medical): No  Physical Activity: Inactive (06/26/2022)   Exercise Vital Sign    Days of Exercise per Week: 0 days    Minutes of Exercise per Session: 0 min  Stress: Stress Concern Present (06/26/2022)   [place]    Feeling of Stress : Rather much  Social Connections: Moderately Isolated (06/07/2022)   Social Connection and Isolation Panel [NHANES]    Frequency of Communication with Friends and Family: More than three times a week    Frequency of Social Gatherings with Friends and Family: Twice a week    Attends Religious Services: 1 to 4 times per year    Active Member of [employer] or Organizations: No    Attends Archivist Meetings: Never    Marital Status: Widowed     Family  History: The patient's family history includes Arthritis in an other family member; Cancer in her brother; Coronary artery disease in her mother; Depression in her brother; Diabetes in her brother and daughter; Heart disease in her mother; Hypertension in her daughter and another family member; Other in an other family member; Stroke in her brother and father; Thyroid disease in her daughter. There is no history of Breast cancer, Colon cancer, Anesthesia problems, Hypotension, Malignant hyperthermia, Pseudochol deficiency, Colon polyps, Esophageal cancer, Rectal cancer, or Stomach cancer.  ROS:   Please see the history of present illness.    All other systems reviewed and are negative.  EKGs/Labs/Other Studies Reviewed:    The following studies were reviewed today: Myocardial perfusion study 08/31/2020: The left ventricular ejection fraction is hyperdynamic (>65%). Nuclear stress EF: 67%. There was no ST segment deviation noted during stress. The study is normal. This is a low risk study.   No ischemia or infarction on perfusion images.   Event Monitor 10/05/2020: The basic rhythm is normal sinus with an average HR of 61 bpm No atrial fibrillation or flutter There are periods of marked sinus bradycardia, mostly nocturnal, but no high-grade heart block or pathologic pauses There are rare PVC's and rare supraventricular beats without sustained arrhythmias  EKG:  EKG is ordered today.  The ekg ordered today demonstrates NSR, RBBB, LAFB, HR 65 bpm. LVH with repolarization abnormality  Recent Labs: 03/22/2022: ALT 19; BUN 25; Creatinine, Ser 1.00; Hemoglobin 13.3; Platelets 167.0; Potassium 4.0; Sodium 141; TSH 1.91  Recent Lipid Panel    Component Value Date/Time   CHOL 141 03/22/2022 1239   TRIG 98.0 03/22/2022 1239   HDL 64.60 03/22/2022 1239   CHOLHDL 2 03/22/2022 1239   VLDL 19.6 03/22/2022 1239   LDLCALC 57 03/22/2022 1239   LDLDIRECT 73.4 05/20/2014 1651     Risk  Assessment/Calculations:          Physical Exam:    VS:  BP (!) 140/70   Pulse 65   Ht '5\' 2"'$  (1.575 m)   Wt 152 lb 6.4 oz (69.1 kg)   LMP  (LMP Unknown)   SpO2 96%   BMI 27.87 kg/m     Wt Readings from Last 3 Encounters:  08/02/22 152 lb 6.4 oz (69.1 kg)  03/22/22 151 lb 12.8 oz (68.9 kg)  11/08/21 148 lb (67.1 kg)     GEN:  Well nourished, well developed elderly woman in no acute distress HEENT: Normal NECK: No JVD; No carotid bruits LYMPHATICS: No lymphadenopathy CARDIAC: RRR, no murmurs, rubs, gallops RESPIRATORY:  Clear to auscultation without rales, wheezing or rhonchi  ABDOMEN: Soft, non-tender, non-distended MUSCULOSKELETAL:  No edema; No deformity  SKIN: Warm and dry NEUROLOGIC:  Alert and oriented x 3 PSYCHIATRIC:  Normal affect   ASSESSMENT:    1. Coronary artery disease involving native coronary artery of native heart without angina pectoris   2. Essential hypertension   3. Chronic heart failure with preserved ejection fraction ([place])   4. Mixed hyperlipidemia    PLAN:    In order of problems listed above:  The patient appears clinically stable with respect to her CAD. She has no angina or evidence of heart failure symptoms. Will continue her current medical Rx. She asks about surgical risk as she has been consider for abdominal hernia repair. She underwent preop assessment last year and was cleared for surgery but deemed 'high risk' on the RCRI with a risk of MACE at 6.6%. she had acute volume overload/diastolic CHF after cholecystectomy in 2022 when she required aggressive fluid resuscitation. I think her risk of future surgical risk is in an acceptable range and she would require post-op cardiology care to help manage her volume status and monitor for signs of ischemia, but certainly not prohibitive risk. Last stress perfusion from 2022 reviewed as above with no ischemia.  BP controlled on current Rx Stable volume status with no cardiac functional limitation  at present. Continue current Rx. Using furosemide only on an 'as needed' basis LDL 57 mg/dL on low dose simvastatin. Continue current Rx.      Medication Adjustments/Labs and Tests Ordered: Current medicines are reviewed at length with the patient today.  Concerns regarding medicines are outlined above.  Orders Placed This Encounter  Procedures   EKG 12-Lead   No orders of the defined types were placed in this encounter.   Patient Instructions  Medication Instructions:  Your physician recommends that you continue on your current medications as directed. Please refer to the Current Medication list given to you today.  *If you need a refill on your cardiac medications before your next appointment, please call your pharmacy*   Lab Work: NONE If you have labs (blood work) drawn today and your tests are completely normal, you will receive your results only by: [place] (if you have MyChart) OR A paper copy in the mail If you have any  lab test that is abnormal or we need to change your treatment, we will call you to review the results.   Testing/Procedures: NONE   Follow-Up: At [facility], you and your health needs are our priority.  As part of our continuing mission to provide you with exceptional heart care, we have created designated Provider Care Teams.  These Care Teams include your primary Cardiologist (physician) and Advanced Practice Providers (APPs -  Physician Assistants and Nurse Practitioners) who all work together to provide you with the care you need, when you need it.  We recommend signing up for the patient portal called "MyChart".  Sign up information is provided on this After Visit Summary.  MyChart is used to connect with patients for Virtual Visits (Telemedicine).  Patients are able to view lab/test results, encounter notes, upcoming appointments, etc.  Non-urgent messages can be sent to your provider as well.   To learn more about what you can do  with MyChart, go to [email or web address].    Your next appointment:   6 months w/APP, then 1 year with:  Provider:   [name], MD       Signed, [name], MD  08/07/2022 9:15 AM    [place]

## 2022-08-07 ENCOUNTER — Ambulatory Visit: Payer: Self-pay | Admitting: Licensed Clinical Social Worker

## 2022-08-07 ENCOUNTER — Encounter: Payer: Self-pay | Admitting: Cardiovascular Disease

## 2022-08-07 NOTE — Patient Outreach (Signed)
  Care Coordination   Follow Up Visit Note   08/07/2022 Name: LAYLANI PUDWILL MRN: [id number] DOB: 1934/09/30  CARALYNN GELBER is a 88 y.o. year old female who sees Mosie Lukes, MD for primary care. I spoke with  [name] by phone today.  What matters to the patients health and wellness today?   Patient wants to continue to drive as needed, wants to continue to live indendpendently in her home. wants to compete daily ADLs, wants to be able to see medical providers as needed    Goals Addressed               This Visit's Progress     Patient wants to continue to drive as needed, wants to continue to live indendpendently in her home. wants to compete daily ADLs, wants to be able to see medical providers as needed (pt-stated)        Interventions  LCSW spoke via phone with client today about client needs Client and LCSW spoke of her sleeping challenges. She continues to have challenges with sleeping.  Spoke of meal intake and appetite of client. Spoke of transport needs. She drives herself to appointments and to complete errands Discussed medication procurement.   Reviewed tremor issues. She said she is managing tremor issues. Reviewed hearing issues. She said she has some hearing loss.  Reviewed ambulation needs. She is not using a device to help her walk Provided counseling support for client Reminded client of program support with RN and Pharmacist as needed. Discussed client support from PCP. She said she stays in touch with PCP office about her needs Client discussed hernia and wearing support in mid section to help with hernia. She said hernia was not causing her any pain Client appreciative of phone call from LCSW        SDOH assessments and interventions completed:  Yes  SDOH Interventions Today    Flowsheet Row Most Recent Value  SDOH Interventions   Depression Interventions/Treatment  Counseling  [client has some sadness occasionally related to managing her  medical needs]  Physical Activity Interventions Other (Comments)  [risk for physical inactivity]        Care Coordination Interventions:  Yes, provided   Follow up plan: Follow up call scheduled for 09/04/22 at 3:30 PM     Encounter Outcome:  Pt. Visit Completed   Norva Riffle.Friedrich Harriott MSW, [place] representative [employer] Care Management [phone]

## 2022-08-07 NOTE — Patient Instructions (Signed)
Visit Information  Thank you for taking time to visit with me today. Please don't hesitate to contact me if I can be of assistance to you before our next scheduled telephone appointment.  Following are the goals we discussed today:   Our next appointment is by telephone on 09/04/22 at 3:30 PM   Please call the care guide team at [phone] if you need to cancel or reschedule your appointment.   If you are experiencing a Mental Health or [place] or need someone to talk to, please go to [facility] [place] [phone])   Following is a copy of your full plan of care:   Interventions  LCSW spoke via phone with client today about client needs Client and LCSW spoke of her sleeping challenges. She continues to have challenges with sleeping.  Spoke of meal intake and appetite of client. Spoke of transport needs. She drives herself to appointments and to complete errands Discussed medication procurement.   Reviewed tremor issues. She said she is managing tremor issues. Reviewed hearing issues. She said she has some hearing loss.  Reviewed ambulation needs. She is not using a device to help her walk Provided counseling support for client Reminded client of program support with RN and Pharmacist as needed. Discussed client support from PCP. She said she stays in touch with PCP office about her needs Client discussed hernia and wearing support in mid section to help with hernia. She said hernia was not causing her any pain Client appreciative of phone call from LCSW  Ms. Trettin was given information about Care Management services by the embedded care coordination team including:  Care Management services include personalized support from designated clinical staff supervised by her physician, including individualized plan of care and coordination with other care providers 24/7 contact phone numbers for assistance for  urgent and routine care needs. The patient may stop CCM services at any time (effective at the end of the month) by phone call to the office staff.  Patient agreed to services and verbal consent obtained.   Norva Riffle.Clarance Bollard MSW, [place] representative [facility] Care Management [phone]

## 2022-08-14 ENCOUNTER — Encounter: Payer: Self-pay | Admitting: Family Medicine

## 2022-08-14 ENCOUNTER — Ambulatory Visit: Payer: Medicare HMO | Admitting: Family Medicine

## 2022-08-14 ENCOUNTER — Ambulatory Visit: Payer: Medicare HMO | Admitting: Cardiovascular Disease

## 2022-08-14 VITALS — BP 135/68 | HR 85 | Temp 97.4°F | Resp 16 | Ht 62.0 in | Wt 151.6 lb

## 2022-08-14 DIAGNOSIS — L089 Local infection of the skin and subcutaneous tissue, unspecified: Secondary | ICD-10-CM

## 2022-08-14 MED ORDER — CEPHALEXIN 500 MG PO CAPS: 500.0000 mg | ORAL_CAPSULE | Freq: Four times a day (QID) | ORAL | 0 refills | Status: DC

## 2022-08-14 NOTE — Patient Instructions (Signed)
Localized infection. Will treat with antibiotics. After area has healed, continue to monitor the site and follow-up if not improving.

## 2022-08-14 NOTE — Progress Notes (Signed)
   Acute Office Visit  Subjective:     Patient ID: Heather [name], female    DOB: [birth year], 89 y.o.   MRN: [id number]  Chief Complaint  Patient presents with   spot on leg    Left     HPI Patient is in today for skin concern.  Patient reports she had noticed a small area to left lower leg that was dry, white, and flaky. They top layer would peel off when wet and the would eventually come back. States she recently tried to pick at it and for the past week she has noticed increasing redness and tenderness to the area. She may have had some scant drainage, but nothing consistent. Area is tender to palpation only. She denies any inflammation or streaking, no fevers, chills, or other lesions/rashes.     ROS All review of systems negative except what is listed in the HPI       Objective:    BP 135/68   Pulse 85   Temp (!) 97.4 F (36.3 C)   Resp 16   Ht '5\' 2"'$  (1.575 m)   Wt 151 lb 9.6 oz (68.8 kg)   LMP  (LMP Unknown)   SpO2 96%   BMI 27.73 kg/m    Physical Exam Vitals reviewed.  Constitutional:      Appearance: Normal appearance.  Skin:    General: Skin is warm and dry.     Comments: Left lower leg (lateral) with ~ 2cm diameter of redness, warmth; no macules/papules at the area of concern. No streaking, edema, fluctuance, or induration.   Neurological:     Mental Status: She is alert and oriented to person, place, and time.  Psychiatric:        Mood and Affect: Mood normal.        Behavior: Behavior normal.        Thought Content: Thought content normal.        Judgment: Judgment normal.     No results found for any visits on 08/14/22.      Assessment & Plan:   Problem List Items Addressed This Visit   None Visit Diagnoses     Local skin infection    -  Primary No signs of the initial flaking/discoloration she was seeing. Recommended we treat the local infection with antibiotics and then have her return to show [place] the lesion if she begins seeing the  previous type appearance again. Patient aware of signs/symptoms requiring further/urgent evaluation.      Relevant Medications   cephALEXin (KEFLEX) 500 MG capsule       Meds ordered this encounter  Medications   cephALEXin (KEFLEX) 500 MG capsule    Sig: Take 1 capsule (500 mg total) by mouth 4 (four) times daily for 5 days.    Dispense:  20 capsule    Refill:  0    Order Specific Question:   Supervising Provider    Answer:   [name] A [[id number]]    Return if symptoms worsen or fail to improve.  Terrilyn Saver, NP

## 2022-08-27 ENCOUNTER — Telehealth: Payer: Self-pay | Admitting: Family Medicine

## 2022-08-27 ENCOUNTER — Other Ambulatory Visit: Payer: Self-pay

## 2022-08-27 DIAGNOSIS — L089 Local infection of the skin and subcutaneous tissue, unspecified: Secondary | ICD-10-CM

## 2022-08-27 NOTE — Telephone Encounter (Signed)
Referral has been placed. 

## 2022-08-27 NOTE — Telephone Encounter (Signed)
Pt states her infection is coming back andTaylor had told her if it did, to get referred to dermatology. Please advise.

## 2022-09-04 ENCOUNTER — Ambulatory Visit: Payer: Self-pay | Admitting: Licensed Clinical Social Worker

## 2022-09-04 NOTE — Patient Outreach (Signed)
  Care Coordination   Follow Up Visit Note   09/04/2022 Name: Heather [name] MRN: [id number] DOB: 1935-02-04  Heather [name] is a 88 y.o. year old female who sees Mosie Lukes, MD for primary care. I spoke with  Heather [name] by phone today.  What matters to the patients health and wellness today? Patient wants to continue to drive as needed, wants to continue to live independently in her home, wants to complete daily ADLs, wants to see medical providers as  needed    Goals Addressed               This Visit's Progress     Patient wants to continue to drive as needed, wants to continue to live indendpendently in her home. wants to compete daily ADLs, wants to be able to see medical providers as needed (pt-stated)        Interventions  LCSW spoke via phone with client today about client needs Client and LCSW spoke of her sleeping challenges. She continues to have challenges with sleeping.  Discussed transport needs . She drives herself to appointments and to complete errands Discussed medication procurement.  Reviewed ambulation needs. She is not using a device to help her walk Reminded client of program support with RN and Pharmacist as needed. Discussed client support from PCP. She said she stays in touch with PCP office about her needs Discussed support of cardiologist. She said she had appointment with cardiologist in January of 2024.  Encouraged Heather [name] to call LCSW for SW support as needed at [phone].      SDOH assessments and interventions completed:  Yes  SDOH Interventions Today    Flowsheet Row Most Recent Value  SDOH Interventions   Depression Interventions/Treatment  Counseling  Physical Activity Interventions Other (Comments)  [client may have walking challenges. has periodic pain in right ankle]  Stress Interventions Provide Counseling  [has stress related to managing medical needs]        Care Coordination Interventions:  Yes, provided     Interventions Today    Flowsheet Row Most Recent Value  Chronic Disease   Chronic disease during today's visit Other  [discussed program support]  General Interventions   General Interventions Discussed/Reviewed General Interventions Discussed, Community Resources  Exercise Interventions   Exercise Discussed/Reviewed Physical Activity  [she said she has pain periodically in her right ankle]  Education Interventions   Education Provided Provided Education  Provided Verbal Education On [employer]       Follow up plan: Follow up call scheduled for 10/09/22 at 2:00 PM     Encounter Outcome:  Pt. Visit Completed   Norva Riffle.Avigayil Ton MSW, [place] Holiday representative [facility] Care Management [phone]

## 2022-09-04 NOTE — Patient Instructions (Signed)
Visit Information  Thank you for taking time to visit with me today. Please don't hesitate to contact me if I can be of assistance to you before our next scheduled telephone appointment.  Following are the goals we discussed today:    Our next appointment is by telephone on 10/09/22 at 2:00 PM   Please call the care guide team at [phone] if you need to cancel or reschedule your appointment.   If you are experiencing a Mental Health or [place] or need someone to talk to, please go to [facility] [place] [phone])   Following is a copy of your full plan of care:   Interventions  LCSW spoke via phone with client today about client needs Client and LCSW spoke of her sleeping challenges. She continues to have challenges with sleeping.  Discussed transport needs . She drives herself to appointments and to complete errands Discussed medication procurement.  Reviewed ambulation needs. She is not using a device to help her walk Reminded client of program support with RN and Pharmacist as needed. Discussed client support from PCP. She said she stays in touch with PCP office about her needs Discussed support of cardiologist. She said she had appointment with cardiologist in January of 2024.  Encouraged Teara to call LCSW for SW support as needed at [phone].  Ms. Tanenbaum was given information about Care Management services by the embedded care coordination team including:  Care Management services include personalized support from designated clinical staff supervised by her physician, including individualized plan of care and coordination with other care providers 24/7 contact phone numbers for assistance for urgent and routine care needs. The patient may stop CCM services at any time (effective at the end of the month) by phone call to the office staff.  Patient agreed to services and verbal consent obtained.    Norva Riffle.Maxmilian Trostel MSW, [place] Holiday representative [facility] Care Management [phone]

## 2022-09-19 DIAGNOSIS — I10 Essential (primary) hypertension: Secondary | ICD-10-CM | POA: Diagnosis not present

## 2022-09-19 DIAGNOSIS — H524 Presbyopia: Secondary | ICD-10-CM | POA: Diagnosis not present

## 2022-09-19 DIAGNOSIS — R7303 Prediabetes: Secondary | ICD-10-CM | POA: Diagnosis not present

## 2022-09-19 DIAGNOSIS — H16223 Keratoconjunctivitis sicca, not specified as Sjogren's, bilateral: Secondary | ICD-10-CM | POA: Diagnosis not present

## 2022-09-19 DIAGNOSIS — H43813 Vitreous degeneration, bilateral: Secondary | ICD-10-CM | POA: Diagnosis not present

## 2022-09-19 DIAGNOSIS — H35373 Puckering of macula, bilateral: Secondary | ICD-10-CM | POA: Diagnosis not present

## 2022-09-19 DIAGNOSIS — Z961 Presence of intraocular lens: Secondary | ICD-10-CM | POA: Diagnosis not present

## 2022-09-19 DIAGNOSIS — H35033 Hypertensive retinopathy, bilateral: Secondary | ICD-10-CM | POA: Diagnosis not present

## 2022-09-19 LAB — HM DIABETES EYE EXAM

## 2022-10-06 ENCOUNTER — Other Ambulatory Visit: Payer: Self-pay | Admitting: Family Medicine

## 2022-10-06 DIAGNOSIS — G2581 Restless legs syndrome: Secondary | ICD-10-CM

## 2022-10-09 ENCOUNTER — Ambulatory Visit: Payer: Self-pay | Admitting: Licensed Clinical Social Worker

## 2022-10-09 DIAGNOSIS — H0288B Meibomian gland dysfunction left eye, upper and lower eyelids: Secondary | ICD-10-CM | POA: Diagnosis not present

## 2022-10-09 DIAGNOSIS — H0288A Meibomian gland dysfunction right eye, upper and lower eyelids: Secondary | ICD-10-CM | POA: Diagnosis not present

## 2022-10-09 DIAGNOSIS — H16223 Keratoconjunctivitis sicca, not specified as Sjogren's, bilateral: Secondary | ICD-10-CM | POA: Diagnosis not present

## 2022-10-09 DIAGNOSIS — L718 Other rosacea: Secondary | ICD-10-CM | POA: Diagnosis not present

## 2022-10-09 NOTE — Patient Outreach (Signed)
Care Coordination   Follow Up Visit Note   10/09/2022 Name: Heather [name] MRN: [id number] DOB: 10-13-1934  Heather [name] is a 88 y.o. year old female who sees Heather Lukes, MD for primary care. I spoke with  Heather [name] by phone today.  What matters to the patients health and wellness today?  Patient wants to continue to drive as needed, wants to continue to live indendpendently in her home. wants to compete daily ADLs, wants to be able to see medical providers as needed     Goals Addressed               This Visit's Progress     Patient wants to continue to drive as needed, wants to continue to live indendpendently in her home. wants to compete daily ADLs, wants to be able to see medical providers as needed (pt-stated)        Interventions  LCSW spoke today via phone with Heather [name] about her current needs Discussed transport of client. She drives herself to and from appointments and drives herself to complete errands as needed Discussed food supply of client. Discussed sleeping issues. She said she has been sleeping a little better recently Discussed mood of client. She said she is taking medications as prescribed and has her prescribed medications. She said she thought her mood was stable Discussed family support with son and with daughter Discussed walking of client. She said she is walking well. She does get short of breath occasionally when walking. She takes rest breaks as needed Reviewed program support Discussed client medical support with Dr. Mosie [name].  Discussed client support with cardiologist Discussed swallowing of client. She said she has to be careful to eat certain foods and to avoid certain foods as well Encouraged client to contact LCSW at [phone] as needed for SW support Client appreciative of call today from LCSW .  Encouraged Heather [name] to call LCSW for SW support as needed at [phone].     SDOH assessments and interventions completed:   Yes  SDOH Interventions Today    Flowsheet Row Most Recent Value  SDOH Interventions   Depression Interventions/Treatment  Counseling  Physical Activity Interventions Other (Comments)  [takes rest breaks when walking. takes rest breaks when fatigued]  Stress Interventions Provide Counseling  [has some stress in managing medical needs (hernia issue,  ankle issues)]        Care Coordination Interventions:  Yes, provided   Interventions Today    Flowsheet Row Most Recent Value  Chronic Disease   Chronic disease during today's visit Other  [spoke with client about her current needs]  General Interventions   General Interventions Discussed/Reviewed General Interventions Discussed, [employer] program support with RN, LCSW and Pharmacist.]  Exercise Interventions   Exercise Discussed/Reviewed Physical Activity  [discussed walking ability of client. discussed occasional shortness of breath of client. she said she takes rest breaks as needed]  Physical Activity Discussed/Reviewed Physical Activity Reviewed  Education Interventions   Education Provided Provided Education  Provided Verbal Education On [employer], Mental Health/Coping with Illness  [discussed hernia issue and ankle issue]  Mental Health Interventions   Mental Health Discussed/Reviewed Anxiety, Coping Strategies  [discussed coping and stress management for client]  Nutrition Interventions   Nutrition Discussed/Reviewed Nutrition Reviewed  Safety Interventions   Safety Discussed/Reviewed Fall Risk  [discussed access in and out of her home.]        Follow up plan: Follow up call  scheduled for 11/27/22 at 1:00 PM     Encounter Outcome:  Pt. Visit Completed   Heather [name] MSW, [place] Holiday representative [facility] Care Management [phone]

## 2022-10-09 NOTE — Patient Instructions (Signed)
Visit Information  Thank you for taking time to visit with me today. Please don't hesitate to contact me if I can be of assistance to you.   Following are the goals we discussed today:   Goals Addressed               This Visit's Progress     Patient wants to continue to drive as needed, wants to continue to live indendpendently in her home. wants to compete daily ADLs, wants to be able to see medical providers as needed (pt-stated)        Interventions  LCSW spoke today via phone with Heather [name] about her current needs Discussed transport of client. She drives herself to and from appointments and drives herself to complete errands as needed Discussed food supply of client. Discussed sleeping issues. She said she has been sleeping a little better recently Discussed mood of client. She said she is taking medications as prescribed and has her prescribed medications. She said she thought her mood was stable Discussed family support with son and with daughter Discussed walking of client. She said she is walking well. She does get short of breath occasionally when walking. She takes rest breaks as needed Reviewed program support Discussed client medical support with Dr. Mosie Lukes.  Discussed client support with cardiologist Discussed swallowing of client. She said she has to be careful to eat certain foods and to avoid certain foods as well Encouraged client to contact LCSW at [phone] as needed for SW support Client appreciative of call today from LCSW .     Our next appointment is by telephone on 11/27/22 at 1:00 PM   Please call the care guide team at [phone] if you need to cancel or reschedule your appointment.   If you are experiencing a Mental Health or [place] or need someone to talk to, please go to [facility] [place] [phone])   The patient verbalized understanding of instructions,  educational materials, and care plan provided today and DECLINED offer to receive copy of patient instructions, educational materials, and care plan.   The patient has been provided with contact information for the care management team and has been advised to call with any health related questions or concerns.   Norva Riffle.Reilly Molchan MSW, [place] Holiday representative [facility] Care Management [phone]

## 2022-10-22 ENCOUNTER — Encounter: Payer: Self-pay | Admitting: *Deleted

## 2022-10-31 ENCOUNTER — Ambulatory Visit: Payer: Self-pay | Admitting: Licensed Clinical Social Worker

## 2022-10-31 NOTE — Patient Outreach (Signed)
  Care Coordination   Follow Up Visit Note   10/31/2022 Name: Heather [name] MRN: [id number] DOB: January 13, 1935  Heather [name] is a 88 y.o. year old female who sees Bradd Canary, MD for primary care. I spoke with  [name] by phone today.  What matters to the patients health and wellness today? Client has to wrap her right ankle to help her  walk and to help her with mobility    Goals Addressed             This Visit's Progress    Patient said she has to wrap her right ankle to help her walk and to help her with mobility       Interventions:  LCSW spoke with client today about client needs. Client said she occasionally has hip pain when walking. She said she has to wrap her right ankle to help her with walking and to help her with mobility Discussed medication procurement. Discussed pain issues. Discussed transport needs. She said she drives her car to appointments as needed and to complete errands as needed Discussed mood of client. She said her mood was stable. She likes to do picture puzzles to help her relax Discussed sleeping of client. She has some difficulty in sleeping Discussed client breathing. She said she did not get short of breath. She just has some hip pain when walking Encouraged client to call LCSW as needed at [phone]        SDOH assessments and interventions completed:  Yes  SDOH Interventions Today    Flowsheet Row Most Recent Value  SDOH Interventions   Depression Interventions/Treatment  Counseling  Physical Activity Interventions Other (Comments)  [some walking challenges]  Stress Interventions Provide Counseling  [stress related to managing medical needs]        Care Coordination Interventions:  Yes, provided   Interventions Today    Flowsheet Row Most Recent Value  Chronic Disease   Chronic disease during today's visit Other  [spoke with client about her needs. She said she has to wrap her right ankle to help with mobility]   General Interventions   General Interventions Discussed/Reviewed General Interventions Discussed, [employer]  [reviewed program support]  Exercise Interventions   Exercise Discussed/Reviewed Physical Activity  [occasionally has hip pain when walking]  Physical Activity Discussed/Reviewed Physical Activity Reviewed  Education Interventions   Education Provided Provided Education  Provided Verbal Education On Community Resources  Mental Health Interventions   Mental Health Discussed/Reviewed Anxiety, Coping Strategies  [discussed mood. she said her mood was stable . She enjoys relaxing by doing picture puzzles and crossword puzzles]  Pharmacy Interventions   Pharmacy Dicussed/Reviewed Pharmacy Topics Discussed  Safety Interventions   Safety Discussed/Reviewed Fall Risk        Follow up plan: LCSW to call client as scheduled to assess client needs at that time   Encounter Outcome:  Pt. Visit Completed   Kelton Pillar.Tyrrell Stephens MSW, LCSW Licensed Visual merchandiser [facility] Care Management [phone]

## 2022-10-31 NOTE — Patient Instructions (Signed)
Visit Information  Thank you for taking time to visit with me today. Please don't hesitate to contact me if I can be of assistance to you.   Following are the goals we discussed today:   Goals Addressed             This Visit's Progress    Patient said she has to wrap her right ankle to help her walk and to help her with mobility       Interventions:  LCSW spoke with client today about client needs. Client said she occasionally has hip pain when walking. She said she has to wrap her right ankle to help her with walking and to help her with mobility Discussed medication procurement. Discussed pain issues. Discussed transport needs. She said she drives her car to appointments as needed and to complete errands as needed Discussed mood of client. She said her mood was stable. She likes to do picture puzzles to help her relax Discussed sleeping of client. She has some difficulty in sleeping Discussed client breathing. She said she did not get short of breath. She just has some hip pain when walking Encouraged client to call LCSW as needed at [phone]       LCSW to call client as scheduled to assess client needs at that time  Please call the care guide team at [phone] if you need to cancel or reschedule your appointment.   If you are experiencing a Mental Health or Behavioral Health Crisis or need someone to talk to, please go to [facility] [address], [place] [phone])   The patient verbalized understanding of instructions, educational materials, and care plan provided today and DECLINED offer to receive copy of patient instructions, educational materials, and care plan.   The patient has been provided with contact information for the care management team and has been advised to call with any health related questions or concerns.   Kelton Pillar.Lillyauna Jenkinson MSW, LCSW Licensed Visual merchandiser [facility] Care Management [phone]

## 2022-11-19 ENCOUNTER — Other Ambulatory Visit: Payer: Self-pay | Admitting: Family Medicine

## 2022-11-26 DIAGNOSIS — H0288B Meibomian gland dysfunction left eye, upper and lower eyelids: Secondary | ICD-10-CM | POA: Diagnosis not present

## 2022-11-26 DIAGNOSIS — L718 Other rosacea: Secondary | ICD-10-CM | POA: Diagnosis not present

## 2022-11-26 DIAGNOSIS — H16223 Keratoconjunctivitis sicca, not specified as Sjogren's, bilateral: Secondary | ICD-10-CM | POA: Diagnosis not present

## 2022-11-26 DIAGNOSIS — H0288A Meibomian gland dysfunction right eye, upper and lower eyelids: Secondary | ICD-10-CM | POA: Diagnosis not present

## 2022-11-27 ENCOUNTER — Encounter: Payer: Medicare HMO | Admitting: Licensed Clinical Social Worker

## 2022-11-27 ENCOUNTER — Ambulatory Visit: Payer: Self-pay | Admitting: Licensed Clinical Social Worker

## 2022-11-27 NOTE — Patient Instructions (Signed)
Visit Information  Thank you for taking time to visit with me today. Please don't hesitate to contact me if I can be of assistance to you.   Following are the goals we discussed today:   Goals Addressed             This Visit's Progress    Patient said she has to wrap her right ankle to help her walk and to help her with mobility       Interventions:  LCSW spoke today via phone with [name]. LCSW and client spoke of client needs. Discussed medication procurement. Discussed pain issues. Client has some occasional hip pain when walking Client and LCSW discussed her wrapping of right ankle Darcia said she wraps her right ankle daily to help with walking and mobility Discussed edema issues. She said she has some edema issues occasionally Discussed client mood. She said she thought her mood was stable. She said she likes to relax by working on picture puzzles Discussed transport needs of client Discussed sleeping of client. She has some difficulty in sleeping Discussed client support. She said she has support from her children. She has support from some friends and neighbors Provided counseling support Encouraged client to call LCSW as needed at [phone]        Our next appointment is by telephone on 01/16/23 at 10:30 AM   Please call the care guide team at [phone] if you need to cancel or reschedule your appointment.   If you are experiencing a Mental Health or Behavioral Health Crisis or need someone to talk to, please go to [facility] [address], [place] [phone])   The patient verbalized understanding of instructions, educational materials, and care plan provided today and DECLINED offer to receive copy of patient instructions, educational materials, and care plan.   The patient has been provided with contact information for the care management team and has been advised to call with any health related questions or  concerns.   Kelton Pillar.Adrionna Delcid MSW, LCSW Licensed Visual merchandiser [facility] Care Management [phone]

## 2022-11-27 NOTE — Patient Outreach (Addendum)
  Care Coordination   Follow Up Visit Note   11/27/2022 Name: Heather [name] MRN: [id number] DOB: Jul 13, 1934  Heather [name] is a 88 y.o. year old female who sees Bradd Canary, MD for primary care. I spoke with  Heather [name] by phone today.  What matters to the patients health and wellness today? Patient has to wrap her right ankle to hep her walk and to help her with mobility     Goals Addressed             This Visit's Progress    Patient said she has to wrap her right ankle to help her walk and to help her with mobility       Interventions:  LCSW spoke today via phone with Heather [name]. LCSW and client spoke of client needs. Discussed medication procurement. Discussed pain issues. Client has some occasional hip pain when walking Client and LCSW discussed her wrapping of right ankle Heather [name] said she wraps her right ankle daily to help with walking and mobility Discussed edema issues. She said she has some edema issues occasionally Discussed client mood. She said she thought her mood was stable. She said she likes to relax by working on picture puzzles Discussed transport needs of client Discussed sleeping of client. She has some difficulty in sleeping Discussed client support. She said she has support from her children. She has support from some friends and neighbors Provided counseling support Encouraged client to call LCSW as needed at [phone]        SDOH assessments and interventions completed:  Yes  SDOH Interventions Today    Flowsheet Row Most Recent Value  SDOH Interventions   Depression Interventions/Treatment  Counseling  Physical Activity Interventions Other (Comments)  [wraps right ankle daily to help with walking and mobility]  Stress Interventions Provide Counseling  [stress in managing medical needs]        Care Coordination Interventions:  Yes, provided   Interventions Today    Flowsheet Row Most Recent Value  Chronic Disease   Chronic  disease during today's visit Other  [spoke with client about client needs]  General Interventions   General Interventions Discussed/Reviewed General Interventions Discussed, Community Resources  Exercise Interventions   Exercise Discussed/Reviewed Exercise Discussed  [client wraps right ankle daily for walking and mobility help]  Education Interventions   Education Provided Provided Education  Provided Verbal Education On [employer]  Mental Health Interventions   Mental Health Discussed/Reviewed Mental Health Discussed, Anxiety  Nutrition Interventions   Nutrition Discussed/Reviewed Nutrition Discussed  Pharmacy Interventions   Pharmacy Dicussed/Reviewed Pharmacy Topics Discussed  Safety Interventions   Safety Discussed/Reviewed Fall Risk        Follow up plan: Follow up call scheduled for 01/16/23 at 10:30 AM    Encounter Outcome:  Pt. Visit Completed   Kelton Pillar.Derec Mozingo MSW, LCSW Licensed Visual merchandiser [facility] Care Management [phone]

## 2022-12-12 ENCOUNTER — Ambulatory Visit (INDEPENDENT_AMBULATORY_CARE_PROVIDER_SITE_OTHER): Payer: Medicare HMO | Admitting: *Deleted

## 2022-12-12 VITALS — BP 153/77 | HR 55 | Ht 62.0 in | Wt 154.2 lb

## 2022-12-12 DIAGNOSIS — Z0000 Encounter for general adult medical examination without abnormal findings: Secondary | ICD-10-CM | POA: Diagnosis not present

## 2022-12-12 NOTE — Progress Notes (Signed)
Subjective:  Pt completed ADLs, Fall risk, and SDOH during e-check in on 12/07/22. Answers verified with pt.    Heather [name] is a 88 y.o. female who presents for Medicare Annual (Subsequent) preventive examination.  Review of Systems     Cardiac Risk Factors include: advanced age (>19men, >6 women);hypertension;diabetes mellitus;dyslipidemia     Objective:    Today's Vitals   12/12/22 1501 12/12/22 1526  BP: (!) 168/68 (!) 153/77  Pulse: 83 (!) 55  Weight: 154 lb 3.2 oz (69.9 kg)   Height: 5\' 2"  (1.575 m)    Body mass index is 28.2 kg/m.     12/12/2022    2:58 PM 11/28/2021    2:09 PM 09/11/2021    4:58 PM 04/17/2021    9:39 AM 04/16/2021    8:00 AM 04/15/2021   12:47 PM 04/14/2021    8:41 PM  Advanced Directives  Does Patient Have a Medical Advance Directive? Yes Yes Yes No Yes Yes Yes  Type of Estate agent of [place];Living will Healthcare Power of [employer] of [place];Living will  Healthcare Power of [place];Living will Healthcare Power of [place];Living will Healthcare Power of [place];Living will  Does patient want to make changes to medical advance directive? No - Patient declined    No - Patient declined    Copy of Healthcare Power of Attorney in Chart? Yes - validated most recent copy scanned in chart (See row information) Yes - validated most recent copy scanned in chart (See row information)   No - copy requested  No - copy requested  Would patient like information on creating a medical advance directive?    No - Patient declined       Current Medications (verified) Outpatient Encounter Medications as of 12/12/2022  Medication Sig   acetaminophen (TYLENOL) 500 MG tablet Take 1,000 mg by mouth every 6 (six) hours as needed for moderate pain.   amLODipine (NORVASC) 10 MG tablet TAKE 1 TABLET EVERY DAY   Ascorbic Acid (VITAMIN C) 500 MG CHEW Chew 500 mg by mouth daily.   aspirin EC 81 MG tablet Take 1 tablet (81 mg total) by  mouth daily.   Blood Glucose Monitoring Suppl (TRUE METRIX AIR GLUCOSE METER) w/Device KIT USE TO CHECK BLOOD SUGAR ONCE DAILY AND AS NEEDED.  DX CODE E11.9   Cholecalciferol (VITAMIN D) 50 MCG (2000 UT) CAPS Take 2,000 Units by mouth daily.    diclofenac Sodium (VOLTAREN) 1 % GEL Apply 1 application topically 4 (four) times daily as needed (hip pain).   estradiol (ESTRACE) 0.1 MG/GM vaginal cream Place 0.5 g vaginally 2 (two) times a week. Place 0.5g nightly for two weeks then twice a week after   famotidine (PEPCID) 20 MG tablet TAKE 1 TABLET AT BEDTIME AS NEEDED FOR HEARTBURN OR INDIGESTION.   furosemide (LASIX) 20 MG tablet Take 1 tablet (20 mg total) by mouth daily. (Patient taking differently: Take 20 mg by mouth daily as needed for edema.)   glucose blood (TRUE METRIX BLOOD GLUCOSE TEST) test strip TEST BLOOD SUGAR EVERY DAY  OR AS NEEDED (Patient taking differently: TEST BLOOD SUGAR EVERY DAY  OR AS NEEDED)   isosorbide mononitrate (IMDUR) 60 MG 24 hr tablet TAKE 1 AND 1/2 TABLETS EVERY DAY   losartan (COZAAR) 100 MG tablet TAKE 1 TABLET (100 MG TOTAL) BY MOUTH DAILY. (Patient taking differently: Take 100 mg by mouth 2 (two) times daily. Per patient taking 1/2 tablet 50 mg of 100 mg  tablet)   magnesium oxide (MAG-OX) 400 MG tablet Take 400 mg by mouth at bedtime.   metoprolol succinate (TOPROL-XL) 25 MG 24 hr tablet Take 0.5 tablets (12.5 mg total) by mouth daily.   omeprazole (PRILOSEC) 20 MG capsule TAKE 1 CAPSULE EVERY DAY AS NEEDED (NEED OFFICE VISIT BEFORE ANY FURTHER REFILLS)   pramipexole (MIRAPEX) 0.25 MG tablet TAKE 1 TABLET TWICE DAILY   simvastatin (ZOCOR) 20 MG tablet TAKE 1 TABLET AT BEDTIME   TRUEplus Lancets 33G MISC USE TO CHECK BLOOD SUGAR ONCE DAILY AND AS NEEDED.  DX CODE: E11.9   Zinc 50 MG TABS Take 50 mg by mouth daily.   No facility-administered encounter medications on file as of 12/12/2022.    Allergies (verified) Baclofen, Gabapentin, and Nitrofurantoin    History: Past Medical History:  Diagnosis Date   Aortic atherosclerosis (HCC) 10/12/2021   Cancer (HCC)    Uterine   Cataract    CHF (congestive heart failure) (HCC)    Chronic stable angina    Coronary artery disease cardiologist--- dr Excell Seltzer   hx NSTEMI  w/ cardiac cath 03-16-2005 PCI with DES to LCx;   cardiac cath 06-27-2005  PCI with DES to RCA with residual dx LAD manage medcially;  lexiscan 01-26-20211 normal no ischemia, ef 70%;  stress echo w/ dobutamine 10-24-2011 negative ishcmeia , normal ef // Myoview 2/22: EF 67, no ischemia or infarction, no TID, low risk    Diabetes mellitus type 2, diet-controlled (HCC)    followed by pcp  (10-19-2020  pt stated checks daily in am,  fasting blood surgar--- 115--120s)   DOE (dyspnea on exertion)    per pt "when I over do",  ok with household chores   Echocardiogram 08/2020    Echocardiogram 2/22: EF 55-60, no RWMA, mild LVH, Gr 2 DD, GLS-21.7%, normal RVSF, trivial MR, RVSP 39.5   Edema of right lower extremity    GERD (gastroesophageal reflux disease)    Hiatal hernia    recurrence,  hx HH repair 1989   History of cervical cancer    s/p  vaginal hysterectomy   History of DVT of lower extremity 2016   11-29-2014 post op right TKA of right lower extremity and completed xarelto    History of esophageal stricture    hx s/p dilatation's   History of gastric ulcer 2005 approx.   History of palpitations 2010   event monitor 07-07-2009 showed NSR w/ freq. SVT ectopies with short runs, rare PVCs   History of TIA (transient ischemic attack) 06/1999   12-15-2019  per pt had several TIA between 12/ 2000 to 02/ 2001 , was sent to specialist @[facility] , had test that was normal (10-19-2020 pt stated no TIAs since ) but has residual of essential tremors of right arm/ hand   Hypertension    Intermittent palpitations    IT band syndrome    Migraine    "ice pick headche lasts about 30 seconds"   Mixed hyperlipidemia    Mixed incontinence urge and  stress    Multiple thyroid nodules    followed by pcp---   ultrasound 11-22-2014 no bx   (12-15-2019 per pt had a endocrinologist and was told did not need bx)   OA (osteoarthritis)    knees, elbow, hip, ankles   Occasional tremors    right arm/ hand  s/p TIA residual 2000   Osteoporosis    taking vitamin d   Peroneal DVT (deep venous thrombosis) (HCC) 12/22/2014   Right bundle  branch block (RBBB) with left anterior fascicular block (LAFB)    RLS (restless legs syndrome)    S/P drug eluting coronary stent placement 2006   03-16-2005  PCI x1 DES to LCx;   06-27-2005  PCI x1 DES to RCA   Stroke [facility])    TIA's   Urinary retention    post op sling prodecure on 10-27-2020, has foley cathether   Past Surgical History:  Procedure Laterality Date   ANTERIOR AND POSTERIOR REPAIR N/A 12/22/2019   Procedure: ANTERIOR (CYSTOCELE)  REPAIR;  Surgeon: Sherian Rein, MD;  Location: [facility];  Service: Gynecology;  Laterality: N/A;   ANTERIOR AND POSTERIOR REPAIR WITH SACROSPINOUS FIXATION N/A 10/27/2020   Procedure: SACROSPINOUS LIGAMENT FIXATION;  Surgeon: Marguerita Beards, MD;  Location: [facility];  Service: Gynecology;  Laterality: N/A;   BLADDER SUSPENSION N/A 10/27/2020   Procedure: TRANSVAGINAL TAPE (TVT) PROCEDURE;  Surgeon: Marguerita Beards, MD;  Location: [facility];  Service: Gynecology;  Laterality: N/A;   CATARACT EXTRACTION W/ INTRAOCULAR LENS  IMPLANT, BILATERAL  2015   CHOLECYSTECTOMY N/A 01/25/2021   Procedure: LAPAROSCOPIC CHOLECYSTECTOMY WITH INTRAOPERATIVE CHOLANGIOGRAM AND LYSIS OF ADHESIONS;  Surgeon: Karie Soda, MD;  Location: [facility];  Service: General;  Laterality: N/A;   COLONOSCOPY  last one ?   CORONARY ANGIOPLASTY WITH STENT PLACEMENT  03-16-2005   dr Riley Kill   PCI and DES x1 to LCx   CORONARY ANGIOPLASTY WITH STENT PLACEMENT  06-27-2005  dr Riley Kill   PCI and DES x1 to RCA with residual disease LAD  70-80% to manage medically   CYSTOSCOPY N/A 10/27/2020   Procedure: CYSTOSCOPY;  Surgeon: Marguerita Beards, MD;  Location: [facility];  Service: Gynecology;  Laterality: N/A;   CYSTOSCOPY N/A 11/09/2020   Procedure: CYSTOSCOPY;  Surgeon: Marguerita Beards, MD;  Location: [facility];  Service: Gynecology;  Laterality: N/A;   EYE SURGERY     FOOT SURGERY Left 1990s   left foot stress fracture repair, per pt no hardware   FRACTURE SURGERY     HERNIA REPAIR     HIATAL HERNIA REPAIR  1989   INCISIONAL HERNIA REPAIR N/A 01/25/2021   Procedure: PRIMARY REPAIR OF INCISIONAL HERNIA;  Surgeon: Karie Soda, MD;  Location: [facility];  Service: General;  Laterality: N/A;   INGUINAL HERNIA REPAIR Left 04/17/2021   Procedure: LAPAROSCOPIC LEFT INGUINAL HERNIA REPAIR WITH MESH;  Surgeon: Axel Filler, MD;  Location: [facility];  Service: General;  Laterality: Left;   JOINT REPLACEMENT     KNEE ARTHROSCOPY Bilateral right ?/   left x2 , last one 09-12-2009 @ [facility]   PUBOVAGINAL SLING N/A 11/09/2020   Procedure: REVISION OF PUBO-VAGINAL SLING;  Surgeon: Marguerita Beards, MD;  Location: [facility];  Service: Gynecology;  Laterality: N/A;   RECTOCELE REPAIR N/A 04/20/2020   Procedure: POSTERIOR REPAIR (RECTOCELE);  Surgeon: Sherian Rein, MD;  Location: [facility];  Service: Gynecology;  Laterality: N/A;   RECTOCELE REPAIR N/A 10/27/2020   Procedure: POSTERIOR REPAIR (RECTOCELE);  Surgeon: Marguerita Beards, MD;  Location: [facility];  Service: Gynecology;  Laterality: N/A;  total time requested for all procedures is 2 hours   SMALL INTESTINE SURGERY     TOTAL KNEE ARTHROPLASTY  11/12/2011   Procedure: TOTAL KNEE ARTHROPLASTY;  Surgeon: Nilda Simmer, MD;  Location: [facility];  Service: Orthopedics;  Laterality: Left;  Dr Thurston Hole wants 90 minutes for  this case   TOTAL KNEE ARTHROPLASTY Right 11/29/2014    Procedure: RIGHT TOTAL KNEE ARTHROPLASTY;  Surgeon: Dannielle Huh, MD;  Location: [facility];  Service: Orthopedics;  Laterality: Right;   UPPER GASTROINTESTINAL ENDOSCOPY  last one 04-25-2017   with dilatation esophageal stricture and savary dilatation   VAGINAL HYSTERECTOMY  1988    no ovaries removed for bleeeding   Family History  Problem Relation Age of Onset   Stroke Father        family hx of M 1st degree relative <50   Heart disease Father    Coronary artery disease Mother    Heart disease Mother    Arthritis Mother    Hearing loss Mother    Vision loss Mother    Depression Brother    Cancer Brother    Stroke Brother    Diabetes Brother    Cancer Brother        bladder with mets   Diabetes Daughter        borderline   Hypertension Daughter    Arthritis Other        family hx of   Hypertension Other        family hx of   Other Other        family hx of cardiovascular disorder   Thyroid disease Daughter    Early death Daughter    Breast cancer Neg Hx    Colon cancer Neg Hx    Anesthesia problems Neg Hx    Hypotension Neg Hx    Malignant hyperthermia Neg Hx    Pseudochol deficiency Neg Hx    Colon polyps Neg Hx    Esophageal cancer Neg Hx    Rectal cancer Neg Hx    Stomach cancer Neg Hx    Social History   Socioeconomic History   Marital status: Widowed    Spouse name: Not on file   Number of children: 3   Years of education: 36   Highest education level: Not on file  Occupational History   Occupation: works partime in Public affairs consultant: RETIRED    Comment: retired  Tobacco Use   Smoking status: Never   Smokeless tobacco: Never  Vaping Use   Vaping Use: Never used  Substance and Sexual Activity   Alcohol use: No    Alcohol/week: 0.0 standard drinks of alcohol   Drug use: Never   Sexual activity: Not Currently    Birth control/protection: Surgical    Comment: lives alone  Other Topics Concern   Not on file  Social History Narrative   Lives with  husband, Caffeine use- Half Caffeine)- 2 cups daily.  3 children living, one passed away.   Education: HS. Business college.  Retired.    Social Determinants of Health   Financial Resource Strain: Low Risk  (12/07/2022)   Overall Financial Resource Strain (CARDIA)    Difficulty of Paying Living Expenses: Not hard at all  Food Insecurity: No Food Insecurity (12/07/2022)   Hunger Vital Sign    Worried About Running Out of Food in the Last Year: Never true    Ran Out of Food in the Last Year: Never true  Transportation Needs: No Transportation Needs (12/07/2022)   PRAPARE - Administrator, Civil Service (Medical): No    Lack of Transportation (Non-Medical): No  Physical Activity: Inactive (12/07/2022)   Exercise Vital Sign    Days of Exercise per Week: 0 days    Minutes of Exercise  per Session: 0 min  Stress: Stress Concern Present (12/07/2022)   [employer] of Occupational Health - Occupational Stress Questionnaire    Feeling of Stress : To some extent  Social Connections: Moderately Isolated (12/07/2022)   Social Connection and Isolation Panel [NHANES]    Frequency of Communication with Friends and Family: Three times a week    Frequency of Social Gatherings with Friends and Family: Once a week    Attends Religious Services: 1 to 4 times per year    Active Member of [employer] or Organizations: No    Attends Banker Meetings: Never    Marital Status: Widowed    Tobacco Counseling Counseling given: Not Answered   Clinical Intake:  Pre-visit preparation completed: Yes  Pain : No/denies pain  BMI - recorded: 28.2 Nutritional Status: BMI 25 -29 Overweight Nutritional Risks: None Diabetes: Yes CBG done?: No Did pt. bring in CBG monitor from home?: No  How often do you need to have someone help you when you read instructions, pamphlets, or other written materials from your doctor or pharmacy?: 1 - Never   Activities of Daily Living    12/07/2022     8:59 AM  In your present state of health, do you have any difficulty performing the following activities:  Hearing? 1  Vision? 1  Difficulty concentrating or making decisions? 0  Walking or climbing stairs? 1  Dressing or bathing? 0  Doing errands, shopping? 0  Preparing Food and eating ? N  Using the Toilet? N  In the past six months, have you accidently leaked urine? Y  Do you have problems with loss of bowel control? N  Managing your Medications? N  Managing your Finances? N  Housekeeping or managing your Housekeeping? N    Patient Care Team: Bradd Canary, MD as PCP - General (Family Medicine) Tonny Bollman, MD as PCP - Cardiology (Cardiology) Domingo Madeira, OD (Optometry) Sherian Rein, MD as Consulting Physician (Obstetrics and Gynecology) Vivi Barrack, DPM as Consulting Physician (Podiatry) Kennon Rounds as Physician Assistant (Cardiology) Henrene Pastor, RPH-CPP (Pharmacist) Karie Soda, MD as Consulting Physician (General Surgery) Marguerita Beards, MD as Consulting Physician (Gynecology) Armbruster, Willaim Rayas, MD as Consulting Physician (Gastroenterology)  Indicate any recent Medical Services you may have received from other than [facility] providers in the past year (date may be approximate).     Assessment:   This is a routine wellness examination for Shawnte.  Hearing/Vision screen No results found.  Dietary issues and exercise activities discussed: Current Exercise Habits: The patient does not participate in regular exercise at present, Exercise limited by: orthopedic condition(s)   Goals Addressed   None    Depression Screen    12/12/2022    3:20 PM 11/27/2022   12:01 PM 10/31/2022    1:49 PM 10/09/2022    2:02 PM 09/04/2022    3:15 PM 08/07/2022    9:37 AM 06/26/2022   10:41 AM  PHQ 2/9 Scores  PHQ - 2 Score 1 2 2 2 2 2 2   PHQ- 9 Score  8 8 8 7 8 8     Fall Risk    12/07/2022    8:59 AM 03/22/2022   11:34 AM 11/28/2021    2:11  PM 10/20/2021   11:06 AM 07/28/2019   10:23 AM  Fall Risk   Falls in the past year? 0  0 0 0  Number falls in past yr: 0 0 0 0 0  Injury with  Fall? 0 0 0 0 0  Risk for fall due to : No Fall Risks  Impaired vision No Fall Risks   Follow up Falls evaluation completed Falls evaluation completed Falls prevention discussed Falls evaluation completed Education provided;Falls prevention discussed    FALL RISK PREVENTION PERTAINING TO THE HOME:  Any stairs in or around the home? No  Home free of loose throw rugs in walkways, pet beds, electrical cords, etc? Yes  Adequate lighting in your home to reduce risk of falls? Yes   ASSISTIVE DEVICES UTILIZED TO PREVENT FALLS:  Life alert? No  Use of a cane, walker or w/c? No  Grab bars in the bathroom? Yes  Shower chair or bench in shower? Yes  Elevated toilet seat or a handicapped toilet? Yes   TIMED UP AND GO:  Was the test performed? Yes .  Length of time to ambulate 10 feet: 8 sec.   Gait steady and fast without use of assistive device  Cognitive Function:    06/18/2016   10:31 AM  MMSE - Mini Mental State Exam  Orientation to time 5  Orientation to Place 5  Registration 3  Attention/ Calculation 5  Recall 2  Language- name 2 objects 2  Language- repeat 1  Language- follow 3 step command 3  Language- read & follow direction 1  Write a sentence 1  Copy design 1  Total score 29        12/12/2022    3:22 PM 11/28/2021    2:12 PM  6CIT Screen  What Year? 0 points 0 points  What month? 0 points 0 points  What time? 0 points 0 points  Count back from 20 0 points 0 points  Months in reverse 0 points 0 points  Repeat phrase 0 points 0 points  Total Score 0 points 0 points    Immunizations Immunization History  Administered Date(s) Administered   Covid-19, Mrna,Vaccine(Spikevax)42yrs and older 04/17/2022   Fluad Quad(high Dose 65+) 04/18/2021, 03/22/2022   Influenza Whole 06/08/2005, 06/29/2010   Influenza, High Dose  Seasonal PF 04/05/2015, 06/18/2016, 05/27/2017, 05/30/2018   Influenza, Quadrivalent, Recombinant, Inj, Pf 04/22/2019   Influenza,inj,Quad PF,6+ Mos 04/30/2014   Influenza,inj,quad, With Preservative 05/06/2020   Moderna Sars-Covid-2 Vaccination 07/21/2019, 08/18/2019, 04/17/2022   PFIZER(Purple Top)SARS-COV-2 Vaccination 05/06/2020   Pfizer Covid-19 Vaccine Bivalent Booster 107yrs & up 05/03/2021   Pneumococcal Conjugate-13 11/17/2013   Pneumococcal Polysaccharide-23 07/09/2000, 05/27/2017   Respiratory Syncytial Virus Vaccine,Recomb Aduvanted(Arexvy) 04/17/2022   Tdap 05/24/2015   Zoster Recombinat (Shingrix) 05/03/2021, 07/26/2021    TDAP status: Up to date  Flu Vaccine status: Up to date  Pneumococcal vaccine status: Up to date  Covid-19 vaccine status: Information provided on how to obtain vaccines.   Qualifies for Shingles Vaccine? Yes   Zostavax completed No   Shingrix Completed?: Yes  Screening Tests Health Maintenance  Topic Date Due   FOOT EXAM  06/18/2017   COVID-19 Vaccine (6 - 2023-24 season) 06/12/2022   HEMOGLOBIN A1C  09/20/2022   Medicare Annual Wellness (AWV)  11/29/2022   INFLUENZA VACCINE  02/07/2023   OPHTHALMOLOGY EXAM  09/19/2023   DTaP/Tdap/Td (2 - Td or Tdap) 05/23/2025   Pneumonia Vaccine 29+ Years old  Completed   DEXA SCAN  Completed   Zoster Vaccines- Shingrix  Completed   HPV VACCINES  Aged Out    Health Maintenance  Health Maintenance Due  Topic Date Due   FOOT EXAM  06/18/2017   COVID-19 Vaccine (6 - 2023-24 season) 06/12/2022  HEMOGLOBIN A1C  09/20/2022   Medicare Annual Wellness (AWV)  11/29/2022    Colorectal cancer screening: No longer required.   Mammogram status: No longer required due to age.  Bone Density status: Completed 01/16/21. Results reflect: Bone density results: OSTEOPENIA. Repeat every 2 years.  Lung Cancer Screening: (Low Dose CT Chest recommended if Age 66-80 years, 30 pack-year currently smoking OR have quit  w/in 15years.) does not qualify.    Additional Screening:  Hepatitis C Screening: does not qualify  Vision Screening: Recommended annual ophthalmology exams for early detection of glaucoma and other disorders of the eye. Is the patient up to date with their annual eye exam?  Yes  Who is the provider or what is the name of the office in which the patient attends annual eye exams? Dr, Soundra Pilon If pt is not established with a provider, would they like to be referred to a provider to establish care? No .   Dental Screening: Recommended annual dental exams for proper oral hygiene  Community Resource Referral / Chronic Care Management: CRR required this visit?  No   CCM required this visit?  No      Plan:     I have personally reviewed and noted the following in the patient's chart:   Medical and social history Use of alcohol, tobacco or illicit drugs  Current medications and supplements including opioid prescriptions. Patient is not currently taking opioid prescriptions. Functional ability and status Nutritional status Physical activity Advanced directives List of other physicians Hospitalizations, surgeries, and ER visits in previous 12 months Vitals Screenings to include cognitive, depression, and falls Referrals and appointments  In addition, I have reviewed and discussed with patient certain preventive protocols, quality metrics, and best practice recommendations. A written personalized care plan for preventive services as well as general preventive health recommendations were provided to patient.     Donne Anon, [place]   12/12/2022   Nurse Notes: None

## 2022-12-12 NOTE — Patient Instructions (Signed)
Heather [name] , Thank you for taking time to come for your Medicare Wellness Visit. I appreciate your ongoing commitment to your health goals. Please review the following plan we discussed and let me know if I can assist you in the future.     This is a list of the screening recommended for you and due dates:  Health Maintenance  Topic Date Due   Complete foot exam   06/18/2017   COVID-19 Vaccine (6 - 2023-24 season) 06/12/2022   Hemoglobin A1C  09/20/2022   Flu Shot  02/07/2023   Eye exam for diabetics  09/19/2023   Medicare Annual Wellness Visit  12/12/2023   DTaP/Tdap/Td vaccine (2 - Td or Tdap) 05/23/2025   Pneumonia Vaccine  Completed   DEXA scan (bone density measurement)  Completed   Zoster (Shingles) Vaccine  Completed   HPV Vaccine  Aged Out    Next appointment: Follow up in one year for your annual wellness visit.   Preventive Care 46 Years and Older, Female Preventive care refers to lifestyle choices and visits with your health care provider that can promote health and wellness. What does preventive care include? A yearly physical exam. This is also called an annual well check. Dental exams once or twice a year. Routine eye exams. Ask your health care provider how often you should have your eyes checked. Personal lifestyle choices, including: Daily care of your teeth and gums. Regular physical activity. Eating a healthy diet. Avoiding tobacco and drug use. Limiting alcohol use. Practicing safe sex. Taking low-dose aspirin every day. Taking vitamin and mineral supplements as recommended by your health care provider. What happens during an annual well check? The services and screenings done by your health care provider during your annual well check will depend on your age, overall health, lifestyle risk factors, and family history of disease. Counseling  Your health care provider may ask you questions about your: Alcohol use. Tobacco use. Drug use. Emotional  well-being. Home and relationship well-being. Sexual activity. Eating habits. History of falls. Memory and ability to understand (cognition). Work and work Astronomer. Reproductive health. Screening  You may have the following tests or measurements: Height, weight, and BMI. Blood pressure. Lipid and cholesterol levels. These may be checked every 5 years, or more frequently if you are over 51 years old. Skin check. Lung cancer screening. You may have this screening every year starting at age 15 if you have a 30-pack-year history of smoking and currently smoke or have quit within the past 15 years. Fecal occult blood test (FOBT) of the stool. You may have this test every year starting at age 3. Flexible sigmoidoscopy or colonoscopy. You may have a sigmoidoscopy every 5 years or a colonoscopy every 10 years starting at age 41. Hepatitis C blood test. Hepatitis B blood test. Sexually transmitted disease (STD) testing. Diabetes screening. This is done by checking your blood sugar (glucose) after you have not eaten for a while (fasting). You may have this done every 1-3 years. Bone density scan. This is done to screen for osteoporosis. You may have this done starting at age 43. Mammogram. This may be done every 1-2 years. Talk to your health care provider about how often you should have regular mammograms. Talk with your health care provider about your test results, treatment options, and if necessary, the need for more tests. Vaccines  Your health care provider may recommend certain vaccines, such as: Influenza vaccine. This is recommended every year. Tetanus, diphtheria, and acellular pertussis (  Tdap, Td) vaccine. You may need a Td booster every 10 years. Zoster vaccine. You may need this after age 19. Pneumococcal 13-valent conjugate (PCV13) vaccine. One dose is recommended after age 63. Pneumococcal polysaccharide (PPSV23) vaccine. One dose is recommended after age 69. Talk to your  health care provider about which screenings and vaccines you need and how often you need them. This information is not intended to replace advice given to you by your health care provider. Make sure you discuss any questions you have with your health care provider. Document Released: 07/22/2015 Document Revised: 03/14/2016 Document Reviewed: 04/26/2015 Elsevier Interactive Patient Education  2017 [employer].  Fall Prevention in the Home Falls can cause injuries. They can happen to people of all ages. There are many things you can do to make your home safe and to help prevent falls. What can I do on the outside of my home? Regularly fix the edges of walkways and driveways and fix any cracks. Remove anything that might make you trip as you walk through a door, such as a raised step or threshold. Trim any bushes or trees on the path to your home. Use bright outdoor lighting. Clear any walking paths of anything that might make someone trip, such as rocks or tools. Regularly check to see if handrails are loose or broken. Make sure that both sides of any steps have handrails. Any raised decks and porches should have guardrails on the edges. Have any leaves, snow, or ice cleared regularly. Use sand or salt on walking paths during winter. Clean up any spills in your garage right away. This includes oil or grease spills. What can I do in the bathroom? Use night lights. Install grab bars by the toilet and in the tub and shower. Do not use towel bars as grab bars. Use non-skid mats or decals in the tub or shower. If you need to sit down in the shower, use a plastic, non-slip stool. Keep the floor dry. Clean up any water that spills on the floor as soon as it happens. Remove soap buildup in the tub or shower regularly. Attach bath mats securely with double-sided non-slip rug tape. Do not have throw rugs and other things on the floor that can make you trip. What can I do in the bedroom? Use night  lights. Make sure that you have a light by your bed that is easy to reach. Do not use any sheets or blankets that are too big for your bed. They should not hang down onto the floor. Have a firm chair that has side arms. You can use this for support while you get dressed. Do not have throw rugs and other things on the floor that can make you trip. What can I do in the kitchen? Clean up any spills right away. Avoid walking on wet floors. Keep items that you use a lot in easy-to-reach places. If you need to reach something above you, use a strong step stool that has a grab bar. Keep electrical cords out of the way. Do not use floor polish or wax that makes floors slippery. If you must use wax, use non-skid floor wax. Do not have throw rugs and other things on the floor that can make you trip. What can I do with my stairs? Do not leave any items on the stairs. Make sure that there are handrails on both sides of the stairs and use them. Fix handrails that are broken or loose. Make sure that handrails are  as long as the stairways. Check any carpeting to make sure that it is firmly attached to the stairs. Fix any carpet that is loose or worn. Avoid having throw rugs at the top or bottom of the stairs. If you do have throw rugs, attach them to the floor with carpet tape. Make sure that you have a light switch at the top of the stairs and the bottom of the stairs. If you do not have them, ask someone to add them for you. What else can I do to help prevent falls? Wear shoes that: Do not have high heels. Have rubber bottoms. Are comfortable and fit you well. Are closed at the toe. Do not wear sandals. If you use a stepladder: Make sure that it is fully opened. Do not climb a closed stepladder. Make sure that both sides of the stepladder are locked into place. Ask someone to hold it for you, if possible. Clearly mark and make sure that you can see: Any grab bars or handrails. First and last  steps. Where the edge of each step is. Use tools that help you move around (mobility aids) if they are needed. These include: Canes. Walkers. Scooters. Crutches. Turn on the lights when you go into a dark area. Replace any light bulbs as soon as they burn out. Set up your furniture so you have a clear path. Avoid moving your furniture around. If any of your floors are uneven, fix them. If there are any pets around you, be aware of where they are. Review your medicines with your doctor. Some medicines can make you feel dizzy. This can increase your chance of falling. Ask your doctor what other things that you can do to help prevent falls. This information is not intended to replace advice given to you by your health care provider. Make sure you discuss any questions you have with your health care provider. Document Released: 04/21/2009 Document Revised: 12/01/2015 Document Reviewed: 07/30/2014 Elsevier Interactive Patient Education  2017 [employer].

## 2022-12-13 ENCOUNTER — Ambulatory Visit (INDEPENDENT_AMBULATORY_CARE_PROVIDER_SITE_OTHER): Payer: Medicare HMO | Admitting: Family Medicine

## 2022-12-13 ENCOUNTER — Encounter: Payer: Self-pay | Admitting: Family Medicine

## 2022-12-13 VITALS — BP 142/53 | HR 68 | Ht 62.0 in | Wt 153.0 lb

## 2022-12-13 DIAGNOSIS — K449 Diaphragmatic hernia without obstruction or gangrene: Secondary | ICD-10-CM | POA: Diagnosis not present

## 2022-12-13 DIAGNOSIS — L089 Local infection of the skin and subcutaneous tissue, unspecified: Secondary | ICD-10-CM | POA: Diagnosis not present

## 2022-12-13 DIAGNOSIS — K219 Gastro-esophageal reflux disease without esophagitis: Secondary | ICD-10-CM | POA: Diagnosis not present

## 2022-12-13 MED ORDER — FAMOTIDINE 20 MG PO TABS: ORAL_TABLET | ORAL | 1 refills | Status: DC

## 2022-12-13 MED ORDER — CEPHALEXIN 500 MG PO CAPS: 500.0000 mg | ORAL_CAPSULE | Freq: Four times a day (QID) | ORAL | 0 refills | Status: AC

## 2022-12-13 NOTE — Patient Instructions (Signed)
Using Keflex for the local infection, but I do want you to call your dermatologist to let them take a look at the lesions.

## 2022-12-13 NOTE — Progress Notes (Signed)
   Acute Office Visit  Subjective:     Patient ID: Heather [name], female    DOB: [birth year], 88 y.o.   MRN: [id number]  Chief Complaint  Patient presents with   Skin Problem    HPI Patient is in today for skin lesion.   Discussed the use of AI scribe software for clinical note transcription with the patient, who gave verbal consent to proceed.  History of Present Illness   The patient, with a history of recurrent skin lesions, presents with new lesions on their right lower leg. The lesions, which are tender, dry, flaky, and red, have been present for an unspecified duration. They have attempted to manage the discomfort by avoiding clothing contact and applying Neosporin and a Band-Aid. The lesions have previously responded to Keflex, but they always return in the same location. The patient has not yet seen a dermatologist for this issue.        ROS All review of systems negative except what is listed in the HPI      Objective:    BP (!) 142/53   Pulse 68   Ht 5\' 2"  (1.575 m)   Wt 153 lb (69.4 kg)   LMP  (LMP Unknown)   SpO2 98%   BMI 27.98 kg/m    Physical Exam Vitals reviewed.  Constitutional:      Appearance: Normal appearance.  Skin:    General: Skin is warm and dry.     Comments: right lower leg with 2 area ~ 1cm diameter of redness, warmth; raised, dry/flaking centrally. No streaking, edema, fluctuance, or induration.   Neurological:     Mental Status: She is alert and oriented to person, place, and time.  Psychiatric:        Mood and Affect: Mood normal.        Behavior: Behavior normal.        Thought Content: Thought content normal.        Judgment: Judgment normal.     No results found for any visits on 12/13/22.      Assessment & Plan:   Problem List Items Addressed This Visit     Gastroesophageal reflux disease with hiatal hernia - Primary Stable. Refill provided.   Relevant Medications   famotidine (PEPCID) 20 MG tablet   Other  Visit Diagnoses     Local skin infection     Keflex for mild local infection. Keep clean. Advise she get her dermatologist to take a look given the nature/presentation   Relevant Medications   cephALEXin (KEFLEX) 500 MG capsule       Meds ordered this encounter  Medications   DISCONTD: famotidine (PEPCID) 20 MG tablet    Sig: TAKE 1 TABLET AT BEDTIME AS NEEDED FOR HEARTBURN OR INDIGESTION.    Dispense:  90 tablet    Refill:  1   cephALEXin (KEFLEX) 500 MG capsule    Sig: Take 1 capsule (500 mg total) by mouth 4 (four) times daily for 5 days.    Dispense:  20 capsule    Refill:  0   famotidine (PEPCID) 20 MG tablet    Sig: TAKE 1 TABLET AT BEDTIME AS NEEDED FOR HEARTBURN OR INDIGESTION.    Dispense:  90 tablet    Refill:  1    Return if symptoms worsen or fail to improve.  Clayborne Dana, NP

## 2022-12-21 DIAGNOSIS — I5032 Chronic diastolic (congestive) heart failure: Secondary | ICD-10-CM | POA: Diagnosis not present

## 2022-12-21 DIAGNOSIS — K432 Incisional hernia without obstruction or gangrene: Secondary | ICD-10-CM | POA: Diagnosis not present

## 2023-01-01 DIAGNOSIS — H16223 Keratoconjunctivitis sicca, not specified as Sjogren's, bilateral: Secondary | ICD-10-CM | POA: Diagnosis not present

## 2023-01-01 DIAGNOSIS — H0288B Meibomian gland dysfunction left eye, upper and lower eyelids: Secondary | ICD-10-CM | POA: Diagnosis not present

## 2023-01-01 DIAGNOSIS — L718 Other rosacea: Secondary | ICD-10-CM | POA: Diagnosis not present

## 2023-01-01 DIAGNOSIS — H0288A Meibomian gland dysfunction right eye, upper and lower eyelids: Secondary | ICD-10-CM | POA: Diagnosis not present

## 2023-01-05 ENCOUNTER — Other Ambulatory Visit: Payer: Self-pay | Admitting: Cardiovascular Disease

## 2023-01-30 DIAGNOSIS — H0288B Meibomian gland dysfunction left eye, upper and lower eyelids: Secondary | ICD-10-CM | POA: Diagnosis not present

## 2023-01-30 DIAGNOSIS — H0288A Meibomian gland dysfunction right eye, upper and lower eyelids: Secondary | ICD-10-CM | POA: Diagnosis not present

## 2023-01-30 DIAGNOSIS — H16223 Keratoconjunctivitis sicca, not specified as Sjogren's, bilateral: Secondary | ICD-10-CM | POA: Diagnosis not present

## 2023-01-30 DIAGNOSIS — L718 Other rosacea: Secondary | ICD-10-CM | POA: Diagnosis not present

## 2023-02-01 ENCOUNTER — Other Ambulatory Visit: Payer: Self-pay | Admitting: Cardiovascular Disease

## 2023-02-04 NOTE — Progress Notes (Signed)
Surgical Instructions   Your procedure is scheduled on August 5. Report to [facility] Main Entrance "A" at 10:30 A.M., then check in with the Admitting office. Any questions or running late day of surgery: call [phone]  Questions prior to your surgery date: call [phone], Monday-Friday, 8am-4pm. If you experience any cold or flu symptoms such as cough, fever, chills, shortness of breath, etc. between now and your scheduled surgery, please notify us at the above number.     Remember:  Do not eat after midnight the night before your surgery   You may drink clear liquids until 9:30am the morning of your surgery.   Clear liquids allowed are: Water, Non-Citrus Juices (without pulp), Carbonated Beverages, Clear Tea, Black Coffee Only (NO MILK, CREAM OR POWDERED CREAMER of any kind), and Gatorade.    Take these medicines the morning of surgery with A SIP OF WATER: amLODipine (NORVASC)  isosorbide mononitrate (IMDUR)  metoprolol succinate (TOPROL-XL)  Olopatadine HCl (PATADAY)  omeprazole (PRILOSEC)  pramipexole (MIRAPEX)   May take these medicines IF NEEDED: acetaminophen (TYLENOL)    One week prior to surgery, STOP taking any  Aleve, Naproxen, Ibuprofen, Motrin, Advil, Goody's, BC's,diclofenac Sodium (VOLTAREN) 1 % GEL , all herbal medications, fish oil, and non-prescription vitamins.          FOLLOW YOUR SURGEON'S INSTRUCTIONS FOR WHEN TO STOP ASPIRIN. IF NO INSTRUCTIONS YOU NEED TO CALL THE OFFICE. [phone].            Do NOT Smoke (Tobacco/Vaping) for 24 hours prior to your procedure.  If you use a CPAP at night, you may bring your mask/headgear for your overnight stay.   You will be asked to remove any contacts, glasses, piercing's, hearing aid's, dentures/partials prior to surgery. Please bring cases for these items if needed.    Patients discharged the day of surgery will not be allowed to drive home, and someone needs to stay with them for 24 hours.  SURGICAL  WAITING ROOM VISITATION Patients may have no more than 2 support people in the waiting area - these visitors may rotate.   Pre-op nurse will coordinate an appropriate time for 1 ADULT support person, who may not rotate, to accompany patient in pre-op.  Children under the age of 64 must have an adult with them who is not the patient and must remain in the main waiting area with an adult.  If the patient needs to stay at the hospital during part of their recovery, the visitor guidelines for inpatient rooms apply.  Please refer to the [facility] website for the visitor guidelines for any additional information.   If you received a COVID test during your pre-op visit  it is requested that you wear a mask when out in public, stay away from anyone that may not be feeling well and notify your surgeon if you develop symptoms. If you have been in contact with anyone that has tested positive in the last 10 days please notify you surgeon.      Pre-operative CHG Bathing Instructions   You can play a key role in reducing the risk of infection after surgery. Your skin needs to be as free of germs as possible. You can reduce the number of germs on your skin by washing with CHG (chlorhexidine gluconate) soap before surgery. CHG is an antiseptic soap that kills germs and continues to kill germs even after washing.   DO NOT use if you have an allergy to chlorhexidine/CHG or antibacterial soaps. If  your skin becomes reddened or irritated, stop using the CHG and notify one of our RNs at [phone].              TAKE A SHOWER THE NIGHT BEFORE SURGERY AND THE DAY OF SURGERY    Please keep in mind the following:  DO NOT shave, including legs and underarms, 48 hours prior to surgery.   You may shave your face before/day of surgery.  Place clean sheets on your bed the night before surgery Use a clean washcloth (not used since being washed) for each shower. DO NOT sleep with pet's night before surgery.  CHG  Shower Instructions:  If you choose to wash your hair and private area, wash first with your normal shampoo/soap.  After you use shampoo/soap, rinse your hair and body thoroughly to remove shampoo/soap residue.  Turn the water OFF and apply half the bottle of CHG soap to a CLEAN washcloth.  Apply CHG soap ONLY FROM YOUR NECK DOWN TO YOUR TOES (washing for 3-5 minutes)  DO NOT use CHG soap on face, private areas, open wounds, or sores.  Pay special attention to the area where your surgery is being performed.  If you are having back surgery, having someone wash your back for you may be helpful. Wait 2 minutes after CHG soap is applied, then you may rinse off the CHG soap.  Pat dry with a clean towel  Put on clean pajamas    Additional instructions for the day of surgery: DO NOT APPLY any lotions, deodorants, cologne, or perfumes.   Do not wear jewelry or makeup Do not wear nail polish, gel polish, artificial nails, or any other type of covering on natural nails (fingers and toes) Do not bring valuables to the hospital. [facility] is not responsible for valuables/personal belongings. Put on clean/comfortable clothes.  Please brush your teeth.  Ask your nurse before applying any prescription medications to the skin.

## 2023-02-05 ENCOUNTER — Encounter (HOSPITAL_COMMUNITY): Payer: Self-pay

## 2023-02-05 ENCOUNTER — Other Ambulatory Visit: Payer: Self-pay

## 2023-02-05 ENCOUNTER — Encounter (HOSPITAL_COMMUNITY)
Admission: RE | Admit: 2023-02-05 | Discharge: 2023-02-05 | Disposition: A | Discharge status: Home / Self Care | Payer: Medicare HMO | Source: Ambulatory Visit | Attending: General Surgery | Admitting: General Surgery

## 2023-02-05 VITALS — BP 171/71 | HR 77 | Temp 97.9°F | Resp 16 | Ht 62.0 in | Wt 152.1 lb

## 2023-02-05 DIAGNOSIS — K432 Incisional hernia without obstruction or gangrene: Secondary | ICD-10-CM | POA: Insufficient documentation

## 2023-02-05 DIAGNOSIS — I5032 Chronic diastolic (congestive) heart failure: Secondary | ICD-10-CM | POA: Diagnosis not present

## 2023-02-05 DIAGNOSIS — E119 Type 2 diabetes mellitus without complications: Secondary | ICD-10-CM | POA: Diagnosis not present

## 2023-02-05 DIAGNOSIS — Z01818 Encounter for other preprocedural examination: Secondary | ICD-10-CM | POA: Diagnosis present

## 2023-02-05 DIAGNOSIS — Z01812 Encounter for preprocedural laboratory examination: Secondary | ICD-10-CM | POA: Diagnosis not present

## 2023-02-05 DIAGNOSIS — Z79899 Other long term (current) drug therapy: Secondary | ICD-10-CM | POA: Insufficient documentation

## 2023-02-05 DIAGNOSIS — I10 Essential (primary) hypertension: Secondary | ICD-10-CM

## 2023-02-05 DIAGNOSIS — I110 Hypertensive heart disease with heart failure: Secondary | ICD-10-CM | POA: Diagnosis not present

## 2023-02-05 LAB — BASIC METABOLIC PANEL
Anion gap: 11 (ref 5–15)
BUN: 15 mg/dL (ref 8–23)
CO2: 25 mmol/L (ref 22–32)
Calcium: 9.6 mg/dL (ref 8.9–10.3)
Chloride: 102 mmol/L (ref 98–111)
Creatinine, Ser: 0.9 mg/dL (ref 0.44–1.00)
GFR, Estimated: >60 mL/min (ref >60)
Glucose, Bld: 120 mg/dL — ABNORMAL HIGH (ref 70–99)
Potassium: 4.1 mmol/L (ref 3.5–5.1)
Sodium: 138 mmol/L (ref 135–145)

## 2023-02-05 LAB — CBC
HCT: 42.6 % (ref 36.0–46.0)
Hemoglobin: 14.1 g/dL (ref 12.0–15.0)
MCH: 31.3 pg (ref 26.0–34.0)
MCHC: 33.1 g/dL (ref 30.0–36.0)
MCV: 94.5 fL (ref 80.0–100.0)
Platelets: 170 10*3/uL (ref 150–400)
RBC: 4.51 MIL/uL (ref 3.87–5.11)
RDW: 12.3 % (ref 11.5–15.5)
WBC: 7.7 10*3/uL (ref 4.0–10.5)
nRBC: 0 % (ref 0.0–0.2)

## 2023-02-05 NOTE — Progress Notes (Addendum)
PCP - Dr. Joaquin Courts  Cardiologist - Dr. Excell Seltzer  PPM/ICD - Denies  Chest x-ray - NI EKG - 08/02/22 Stress Test - 08/31/20 ECHO - 01/29/21 Cardiac Cath - 06/27/2005  Sleep Study - Denies   DM - per patient she is Borderline per PCP DM II listed in Chart Patient checks sugar intermittently in mornings fasting averages 99-124  Last dose of GLP1 agonist-  N/A  Blood Thinner Instructions:N/A Aspirin Instructions:Requested patient to call for instruction   Anesthesia review: Yes cardiac history  Patient denies shortness of breath, fever, cough and chest pain at PAT appointment   All instructions explained to the patient, with a verbal understanding of the material. Patient agrees to go over the instructions while at home for a better understanding. . The opportunity to ask questions was provided.

## 2023-02-06 NOTE — Anesthesia Preprocedure Evaluation (Addendum)
Anesthesia Evaluation  Patient identified by MRN, date of birth, ID band Patient awake    Reviewed: Allergy & Precautions, NPO status , Patient's Chart, lab work & pertinent test results, reviewed documented beta blocker date and time   History of Anesthesia Complications Negative for: history of anesthetic complications  Airway Mallampati: III  TM Distance: <3 FB Neck ROM: Full    Dental  (+) Teeth Intact, Dental Advisory Given   Pulmonary neg shortness of breath, neg sleep apnea, neg COPD, neg recent URI   breath sounds clear to auscultation       Cardiovascular hypertension, Pt. on medications and Pt. on home beta blockers (-) angina + CAD, + Cardiac Stents, +CHF, + DOE and + DVT  + dysrhythmias  Rhythm:Regular  TTE 01/29/21: EF 60-65%, normal RV function, no hemodynamically significant valve dysfunction  Normal stress test 08/31/20   EKG 08/02/2022 ([facility]): Normal sinus rhythm Right bundle branch block Left anterior fascicular block Bifascicular block Left ventricular hypertrophy with repolarization abnormality    Neuro/Psych  Headaches PSYCHIATRIC DISORDERS Anxiety Depression    US Carotid 11/17/15: Impressions: Heterogeneous plaque, bilaterally. Stable 1 to 39% bilateral ICA stenosis. Normal subclavian arteries, bilaterally. Patent vertebral arteries with antegrade flow.  TIA Neuromuscular disease CVA, No Residual Symptoms    GI/Hepatic Neg liver ROS, hiatal hernia, PUD,GERD  ,,INCARCERATED LEFT INGUINAL HERNIA H/o esophageal stricture s/p dilation   Endo/Other  diabetes, Type 2    Renal/GU negative Renal ROSLab Results      Component                Value               Date                      NA                       138                 02/05/2023                K                        4.1                 02/05/2023                CO2                      25                  02/05/2023                 GLUCOSE                  120 (H)             02/05/2023                BUN                      15                  02/05/2023                CREATININE  0.90                02/05/2023                CALCIUM                  9.6                 02/05/2023                GFR                      51.39 (L)           03/22/2022                EGFR                     48 (L)              06/14/2021                GFRNONAA                 >60                 02/05/2023             negative genitourinary   Musculoskeletal  (+) Arthritis ,    Abdominal   Peds  Hematology  (+) Blood dyscrasia, anemia Lab Results      Component                Value               Date                      WBC                      7.7                 02/05/2023                HGB                      14.1                02/05/2023                HCT                      42.6                02/05/2023                MCV                      94.5                02/05/2023                PLT                      170                 02/05/2023              Anesthesia Other Findings   Reproductive/Obstetrics  Anesthesia Physical Anesthesia Plan  ASA: 4  Anesthesia Plan: General   Post-op Pain Management: Ofirmev IV (intra-op)*   Induction: Intravenous  PONV Risk Score and Plan: 4 or greater and Ondansetron, Treatment may vary due to age or medical condition and TIVA  Airway Management Planned: Oral ETT  Additional Equipment: None  Intra-op Plan:   Post-operative Plan: Extubation in OR  Informed Consent: I have reviewed the patients History and Physical, chart, labs and discussed the procedure including the risks, benefits and alternatives for the proposed anesthesia with the patient or authorized representative who has indicated his/her understanding and acceptance.     Dental advisory given  Plan Discussed with:  Anesthesiologist  Anesthesia Plan Comments: ( Patient is an 88 year old female scheduled for the above procedure.  History includes never smoker, CAD (DES LCX 03/2005; DES RCA, medical therapy for moderate LAD 06/2005; on Imdur for chronic stable angina), HFpEF, palpitations (runs of SVT 2010 monitor), RBBB/bifascicular block, DM2 (diet controlled), exertional dyspnea, LE edema, TIA (2000), HLD, GERD, hiatal hernia (s/p repair 1989), osteoarthritis (left TKA 11/12/11, right TKA 11/29/14), cystocele (s/p anterior colporrhaphy 12/22/19, sling revision 11/09/20), rectocele (s/p posterior colporrhaphy 04/20/20, 10/27/20), cholecystectomy (with repair of incarcerated incisional hernia 01/25/21), inguinal hernia (left IHR 04/17/21), DVT (right peroneal DVT 12/21/14, post right TKA, s/p rivaroxaban x 6 months), thyroid nodules (left 06/17/21 [place], one year f/u rec), hysterectomy (some notes say for cancer, other for uterine prolapse).  Cardiologist Dr. Excell Seltzer wrote at 08/02/22 evaluation: "The patient appears clinically stable with respect to her CAD. She has no angina or evidence of heart failure symptoms. Will continue her current medical Rx. She asks about surgical risk as she has been consider for abdominal hernia repair. She underwent preop assessment last year and was cleared for surgery but deemed 'high risk' on the RCRI with a risk of MACE at 6.6%. she had acute volume overload/diastolic CHF after cholecystectomy in 2022 when she required aggressive fluid resuscitation. I think her risk of future surgical risk is in an acceptable range and she would require post-op cardiology care to help manage her volume status and monitor for signs of ischemia, but certainly not prohibitive risk. Last stress perfusion from 2022 reviewed as above with no ischemia."   )        Anesthesia Quick Evaluation

## 2023-02-06 NOTE — Progress Notes (Addendum)
Anesthesia Chart Review:  Case: [id number] Date/Time: 02/11/23 1215   Procedures:      LAPAROSCOPIC VS OPEN VENTRAL HERNIA REPAIR WITH MESH - 2.5 HRS     POSSIBLE OPEN HERNIA REPAIR VENTRAL ADULT     LYSIS OF ADHESION   Anesthesia type: General   Pre-op diagnosis: RECURRENT INCISIONAL HERNIA   [facility] / [facility]   Surgeons: Axel Filler, MD       DISCUSSION: Patient is an 88 year old female scheduled for the above procedure.  History includes never smoker, CAD (DES LCX 03/2005; DES RCA, medical therapy for moderate LAD 06/2005; on Imdur for chronic stable angina), HFpEF, palpitations (runs of SVT 2010 monitor), RBBB/bifascicular block, DM2 (diet controlled), exertional dyspnea, LE edema, TIA (2000), HLD, GERD, hiatal hernia (s/p repair 1989), osteoarthritis (left TKA 11/12/11, right TKA 11/29/14), cystocele (s/p anterior colporrhaphy 12/22/19, sling revision 11/09/20), rectocele (s/p posterior colporrhaphy 04/20/20, 10/27/20), cholecystectomy (with repair of incarcerated incisional hernia 01/25/21), inguinal hernia (left IHR 04/17/21), DVT (right peroneal DVT 12/21/14, post right TKA, s/p rivaroxaban x 6 months), thyroid nodules (left 06/17/21 [place], one year f/u rec), hysterectomy (some notes say for cancer, other for uterine prolapse).  She was last evaluated by cardiologist Dr. Excell Seltzer on 08/02/22. He wrote, "The patient appears clinically stable with respect to her CAD. She has no angina or evidence of heart failure symptoms. Will continue her current medical Rx. She asks about surgical risk as she has been consider for abdominal hernia repair. She underwent preop assessment last year and was cleared for surgery but deemed 'high risk' on the RCRI with a risk of MACE at 6.6%. she had acute volume overload/diastolic CHF after cholecystectomy in 2022 when she required aggressive fluid resuscitation. I think her risk of future surgical risk is in an acceptable range and she would require post-op  cardiology care to help manage her volume status and monitor for signs of ischemia, but certainly not prohibitive risk. Last stress perfusion from 2022 reviewed as above with no ischemia." She denied SOB, cough, fever, chest pain at 02/06/23 PAT RN visit.   - At 12/21/22 follow-up with Dr. Derrell Lolling, he noted cardiology recommendations. Patient reported some increased discomfort from her hernia and felt it was somewhat larger. She was using an abdominal binder with minimal improvement. He discussed consideration of compression garment versus surgery. He was unsure of the degree of scar tissue or time required for LOA but anticipated surgery could be up to 2 1/2 hours with post-operative hospital stay for 2-3 days with cardiology consultation. She was still unsure if she wanted to pursue surgery, but called in July indicating worsening pain but still able to reduce hernia and was ready to schedule surgery.   Anesthesia team to evaluate on the day of surgery. [facility] Triage nurse Toniann Fail to follow-up with patient regarding perioperative ASA instructions--plan to hold for 5 days prior to surgery.    VS: BP (!) 171/71   Pulse 77   Temp 36.6 C   Resp 16   Ht 5\' 2"  (1.575 m)   Wt 69 kg   LMP  (LMP Unknown)   SpO2 96%   BMI 27.82 kg/m  BP Readings from Last 3 Encounters:  02/05/23 (!) 171/71  12/13/22 (!) 142/53  12/12/22 (!) 153/77     PROVIDERS: Bradd Canary, MD is PCP Tonny Bollman, MD is Cardiologist    LABS: Labs reviewed: Acceptable for surgery. (all labs ordered are listed, but only abnormal results are displayed)  Labs Reviewed  HEMOGLOBIN A1C - Abnormal; Notable for the following components:      Result Value   Hgb A1c MFr Bld 6.7 (*)    All other components within normal limits  BASIC METABOLIC PANEL - Abnormal; Notable for the following components:   Glucose, Bld 120 (*)    All other components within normal limits  CBC     IMAGES: CTA Chest/Abd/Pelvis  09/11/21: IMPRESSION: - No evidence of aortic dissection or aneurysmal dilatation. - Changes of prior granulomatous disease. - 13 mm hypodense lesion within the left lobe of the thyroid stable in appearance from the prior exam. Not clinically significant; no follow-up imaging recommended (ref: J Am Coll Radiol. 2015 Feb;12(2): 143-50). - Stable scarring in the lingula. - Diverticulosis without diverticulitis. - Aortic Atherosclerosis (ICD10-I70.0).   EKG: EKG 08/02/2022 ([facility]): Normal sinus rhythm Right bundle branch block Left anterior fascicular block Bifascicular block Left ventricular hypertrophy with repolarization abnormality   CV: Cardiac event monitor 09/05/20 - 10/04/20: The basic rhythm is normal sinus with an average HR of 61 bpm No atrial fibrillation or flutter There are periods of marked sinus bradycardia, mostly nocturnal, but no high-grade heart block or pathologic pauses There are rare PVC's and rare supraventricular beats without sustained arrhythmias    Echo 08/26/2020:  1. Left ventricular ejection fraction, by estimation, is 55 to 60%. Left  ventricular ejection fraction by 3D volume is 61 %. The left ventricle has  normal function. The left ventricle has no regional wall motion  abnormalities. There is mild concentric  left ventricular hypertrophy. Left ventricular diastolic parameters are  consistent with Grade II diastolic dysfunction (pseudonormalization). The  average left ventricular global longitudinal strain is -21.7 %. The global  longitudinal strain is normal.   2. Right ventricular systolic function is normal. The right ventricular  size is normal. There is mildly elevated pulmonary artery systolic  pressure.   3. The mitral valve is abnormal. Trivial mitral valve regurgitation. No  evidence of mitral stenosis.   4. The aortic valve is tricuspid. There is mild calcification of the  aortic valve. There is mild thickening of the aortic  valve. Aortic valve  regurgitation is not visualized. No aortic stenosis is present.   5. The inferior vena cava is normal in size with greater than 50%  respiratory variability, suggesting right atrial pressure of 3 mmHg.   6. Mechanism of mild tricuspid regurgitation suggestive of prolapse. In  the setting eccentric jet, study may underestimate the severity of the  regurgitation.M     Stress Test 08/31/20: The left ventricular ejection fraction is hyperdynamic (>65%). Nuclear stress EF: 67%. There was no ST segment deviation noted during stress. The study is normal. This is a low risk study. No ischemia or infarction on perfusion images.   US Carotid 11/17/15: Impressions: Heterogeneous plaque, bilaterally. Stable 1 to 39% bilateral ICA stenosis. Normal subclavian arteries, bilaterally. Patent vertebral arteries with antegrade flow.   Cardiac cath 06/27/05: 1. Successful percutaneous intervention of the right coronary artery.  2. Previous successful percutaneous intervention of the circumflex coronary artery with continued patency.  3. Continued moderately high-grade (70-80%) left anterior descending artery disease in the midvessel, but with a small-caliber vessel and overlapping side branch at the location. (Medical therapy was ultimately recommended for LAD disease.)   Past Medical History:  Diagnosis Date   Aortic atherosclerosis (HCC) 10/12/2021   Cancer (HCC)    Uterine   Cataract    CHF (congestive heart failure) (HCC)  Chronic stable angina    Coronary artery disease cardiologist--- dr Excell Seltzer   hx NSTEMI  w/ cardiac cath 03-16-2005 PCI with DES to LCx;   cardiac cath 06-27-2005  PCI with DES to RCA with residual dx LAD manage medcially;  lexiscan 01-26-20211 normal no ischemia, ef 70%;  stress echo w/ dobutamine 10-24-2011 negative ishcmeia , normal ef // Myoview 2/22: EF 67, no ischemia or infarction, no TID, low risk    Diabetes mellitus type 2, diet-controlled  (HCC)    followed by pcp  (10-19-2020  pt stated checks daily in am,  fasting blood surgar--- 115--120s)   DOE (dyspnea on exertion)    per pt "when I over do",  ok with household chores   Echocardiogram 08/2020    Echocardiogram 2/22: EF 55-60, no RWMA, mild LVH, Gr 2 DD, GLS-21.7%, normal RVSF, trivial MR, RVSP 39.5   Edema of right lower extremity    GERD (gastroesophageal reflux disease)    Hiatal hernia    recurrence,  hx HH repair 1989   History of cervical cancer    s/p  vaginal hysterectomy   History of DVT of lower extremity 2016   11-29-2014 post op right TKA of right lower extremity and completed xarelto    History of esophageal stricture    hx s/p dilatation's   History of gastric ulcer 2005 approx.   History of palpitations 2010   event monitor 07-07-2009 showed NSR w/ freq. SVT ectopies with short runs, rare PVCs   History of TIA (transient ischemic attack) 06/1999   12-15-2019  per pt had several TIA between 12/ 2000 to 02/ 2001 , was sent to specialist @[facility] , had test that was normal (10-19-2020 pt stated no TIAs since ) but has residual of essential tremors of right arm/ hand   Hypertension    Intermittent palpitations    IT band syndrome    Migraine    "ice pick headche lasts about 30 seconds"   Mixed hyperlipidemia    Mixed incontinence urge and stress    Multiple thyroid nodules    followed by pcp---   ultrasound 11-22-2014 no bx   (12-15-2019 per pt had a endocrinologist and was told did not need bx)   OA (osteoarthritis)    knees, elbow, hip, ankles   Occasional tremors    right arm/ hand  s/p TIA residual 2000   Osteoporosis    taking vitamin d   Peroneal DVT (deep venous thrombosis) (HCC) 12/22/2014   Right bundle branch block (RBBB) with left anterior fascicular block (LAFB)    RLS (restless legs syndrome)    S/P drug eluting coronary stent placement 2006   03-16-2005  PCI x1 DES to LCx;   06-27-2005  PCI x1 DES to RCA   Stroke [facility])    TIA's    Urinary retention    post op sling prodecure on 10-27-2020, has foley cathether    Past Surgical History:  Procedure Laterality Date   ANTERIOR AND POSTERIOR REPAIR N/A 12/22/2019   Procedure: ANTERIOR (CYSTOCELE)  REPAIR;  Surgeon: Sherian Rein, MD;  Location: [facility];  Service: Gynecology;  Laterality: N/A;   ANTERIOR AND POSTERIOR REPAIR WITH SACROSPINOUS FIXATION N/A 10/27/2020   Procedure: SACROSPINOUS LIGAMENT FIXATION;  Surgeon: Marguerita Beards, MD;  Location: [facility];  Service: Gynecology;  Laterality: N/A;   BLADDER SUSPENSION N/A 10/27/2020   Procedure: TRANSVAGINAL TAPE (TVT) PROCEDURE;  Surgeon: Marguerita Beards, MD;  Location: [facility];  Service: Gynecology;  Laterality: N/A;   CATARACT EXTRACTION W/ INTRAOCULAR LENS  IMPLANT, BILATERAL  2015   CHOLECYSTECTOMY N/A 01/25/2021   Procedure: LAPAROSCOPIC CHOLECYSTECTOMY WITH INTRAOPERATIVE CHOLANGIOGRAM AND LYSIS OF ADHESIONS;  Surgeon: Karie Soda, MD;  Location: [facility];  Service: General;  Laterality: N/A;   COLONOSCOPY  last one ?   CORONARY ANGIOPLASTY WITH STENT PLACEMENT  03-16-2005   dr Riley Kill   PCI and DES x1 to LCx   CORONARY ANGIOPLASTY WITH STENT PLACEMENT  06-27-2005  dr Riley Kill   PCI and DES x1 to RCA with residual disease LAD 70-80% to manage medically   CYSTOSCOPY N/A 10/27/2020   Procedure: CYSTOSCOPY;  Surgeon: Marguerita Beards, MD;  Location: [facility];  Service: Gynecology;  Laterality: N/A;   CYSTOSCOPY N/A 11/09/2020   Procedure: CYSTOSCOPY;  Surgeon: Marguerita Beards, MD;  Location: [facility];  Service: Gynecology;  Laterality: N/A;   EYE SURGERY     FOOT SURGERY Left 1990s   left foot stress fracture repair, per pt no hardware   FRACTURE SURGERY     HERNIA REPAIR     HIATAL HERNIA REPAIR  1989   INCISIONAL HERNIA REPAIR N/A 01/25/2021   Procedure: PRIMARY REPAIR OF INCISIONAL HERNIA;   Surgeon: Karie Soda, MD;  Location: [facility];  Service: General;  Laterality: N/A;   INGUINAL HERNIA REPAIR Left 04/17/2021   Procedure: LAPAROSCOPIC LEFT INGUINAL HERNIA REPAIR WITH MESH;  Surgeon: Axel Filler, MD;  Location: [facility];  Service: General;  Laterality: Left;   JOINT REPLACEMENT     KNEE ARTHROSCOPY Bilateral right ?/   left x2 , last one 09-12-2009 @ [facility]   PUBOVAGINAL SLING N/A 11/09/2020   Procedure: REVISION OF PUBO-VAGINAL SLING;  Surgeon: Marguerita Beards, MD;  Location: [facility];  Service: Gynecology;  Laterality: N/A;   RECTOCELE REPAIR N/A 04/20/2020   Procedure: POSTERIOR REPAIR (RECTOCELE);  Surgeon: Sherian Rein, MD;  Location[facility];  Service: Gynecology;  Laterality: N/A;   RECTOCELE REPAIR N/A 10/27/2020   Procedure: POSTERIOR REPAIR (RECTOCELE);  Surgeon: Marguerita Beards, MD;  Location: [facility];  Service: Gynecology;  Laterality: N/A;  total time requested for all procedures is 2 hours   SMALL INTESTINE SURGERY     TOTAL KNEE ARTHROPLASTY  11/12/2011   Procedure: TOTAL KNEE ARTHROPLASTY;  Surgeon: Nilda Simmer, MD;  Location[facility];  Service: Orthopedics;  Laterality: Left;  Dr Thurston Hole wants 90 minutes for this case   TOTAL KNEE ARTHROPLASTY Right 11/29/2014   Procedure: RIGHT TOTAL KNEE ARTHROPLASTY;  Surgeon: Dannielle Huh, MD;  Location[facility];  Service: Orthopedics;  Laterality: Right;   UPPER GASTROINTESTINAL ENDOSCOPY  last one 04-25-2017   with dilatation esophageal stricture and savary dilatation   VAGINAL HYSTERECTOMY  1988    no ovaries removed for bleeeding    MEDICATIONS:  acetaminophen (TYLENOL) 500 MG tablet   amLODipine (NORVASC) 10 MG tablet   aspirin EC 81 MG tablet   Blood Glucose Monitoring Suppl (TRUE METRIX AIR GLUCOSE METER) w/Device KIT   cholecalciferol (VITAMIN D3) 25 MCG (1000 UNIT) tablet   diclofenac Sodium (VOLTAREN) 1 % GEL   estradiol (ESTRACE)  0.1 MG/GM vaginal cream   famotidine (PEPCID) 20 MG tablet   furosemide (LASIX) 20 MG tablet   glucose blood (TRUE METRIX BLOOD GLUCOSE TEST) test strip   Glycerin, PF, (OPTASE COMFORT DRY EYE) 1 % SOLN   isosorbide mononitrate (IMDUR)  60 MG 24 hr tablet   losartan (COZAAR) 100 MG tablet   Magnesium Oxide 250 MG TABS   metoprolol succinate (TOPROL-XL) 25 MG 24 hr tablet   Olopatadine HCl (PATADAY) 0.2 % SOLN   omeprazole (PRILOSEC) 20 MG capsule   pramipexole (MIRAPEX) 0.25 MG tablet   simvastatin (ZOCOR) 20 MG tablet   TRUEplus Lancets 33G MISC   No current facility-administered medications for this encounter.    Shonna Chock, PA-C Surgical Short Stay/Anesthesiology [facility] Phone [phone] [facility] Phone [phone] 02/06/2023 5:06 PM

## 2023-02-11 ENCOUNTER — Other Ambulatory Visit: Payer: Self-pay

## 2023-02-11 ENCOUNTER — Encounter (HOSPITAL_COMMUNITY)
Admission: RE | Disposition: A | Discharge status: Home / Self Care | Payer: Self-pay | Source: Home / Self Care | Attending: General Surgery

## 2023-02-11 ENCOUNTER — Inpatient Hospital Stay (HOSPITAL_COMMUNITY): Payer: Medicare HMO | Admitting: Vascular Surgery

## 2023-02-11 ENCOUNTER — Encounter (HOSPITAL_COMMUNITY): Payer: Self-pay | Admitting: General Surgery

## 2023-02-11 ENCOUNTER — Inpatient Hospital Stay (HOSPITAL_BASED_OUTPATIENT_CLINIC_OR_DEPARTMENT_OTHER): Payer: Medicare HMO | Admitting: Anesthesiology

## 2023-02-11 ENCOUNTER — Inpatient Hospital Stay (HOSPITAL_COMMUNITY)
Admission: RE | Admit: 2023-02-11 | Discharge: 2023-02-13 | Disposition: A | Discharge status: Home / Self Care | Payer: Medicare HMO | Attending: General Surgery | Admitting: General Surgery

## 2023-02-11 DIAGNOSIS — Z79899 Other long term (current) drug therapy: Secondary | ICD-10-CM

## 2023-02-11 DIAGNOSIS — K432 Incisional hernia without obstruction or gangrene: Secondary | ICD-10-CM | POA: Diagnosis not present

## 2023-02-11 DIAGNOSIS — I2510 Atherosclerotic heart disease of native coronary artery without angina pectoris: Secondary | ICD-10-CM | POA: Diagnosis not present

## 2023-02-11 DIAGNOSIS — Z96651 Presence of right artificial knee joint: Secondary | ICD-10-CM | POA: Diagnosis present

## 2023-02-11 DIAGNOSIS — Z8673 Personal history of transient ischemic attack (TIA), and cerebral infarction without residual deficits: Secondary | ICD-10-CM | POA: Diagnosis not present

## 2023-02-11 DIAGNOSIS — R0602 Shortness of breath: Secondary | ICD-10-CM | POA: Diagnosis not present

## 2023-02-11 DIAGNOSIS — B20 Human immunodeficiency virus [HIV] disease: Secondary | ICD-10-CM | POA: Diagnosis not present

## 2023-02-11 DIAGNOSIS — I110 Hypertensive heart disease with heart failure: Secondary | ICD-10-CM | POA: Diagnosis not present

## 2023-02-11 DIAGNOSIS — Z8719 Personal history of other diseases of the digestive system: Secondary | ICD-10-CM | POA: Diagnosis not present

## 2023-02-11 DIAGNOSIS — I700 Atherosclerosis of aorta: Secondary | ICD-10-CM | POA: Diagnosis not present

## 2023-02-11 DIAGNOSIS — Z961 Presence of intraocular lens: Secondary | ICD-10-CM | POA: Diagnosis present

## 2023-02-11 DIAGNOSIS — K219 Gastro-esophageal reflux disease without esophagitis: Secondary | ICD-10-CM | POA: Diagnosis present

## 2023-02-11 DIAGNOSIS — Z8711 Personal history of peptic ulcer disease: Secondary | ICD-10-CM

## 2023-02-11 DIAGNOSIS — Z8249 Family history of ischemic heart disease and other diseases of the circulatory system: Secondary | ICD-10-CM

## 2023-02-11 DIAGNOSIS — Z9049 Acquired absence of other specified parts of digestive tract: Secondary | ICD-10-CM

## 2023-02-11 DIAGNOSIS — I509 Heart failure, unspecified: Secondary | ICD-10-CM | POA: Diagnosis not present

## 2023-02-11 DIAGNOSIS — Z833 Family history of diabetes mellitus: Secondary | ICD-10-CM | POA: Diagnosis not present

## 2023-02-11 DIAGNOSIS — G2581 Restless legs syndrome: Secondary | ICD-10-CM | POA: Diagnosis present

## 2023-02-11 DIAGNOSIS — I5032 Chronic diastolic (congestive) heart failure: Secondary | ICD-10-CM | POA: Diagnosis present

## 2023-02-11 DIAGNOSIS — Z7982 Long term (current) use of aspirin: Secondary | ICD-10-CM

## 2023-02-11 DIAGNOSIS — E119 Type 2 diabetes mellitus without complications: Secondary | ICD-10-CM | POA: Diagnosis not present

## 2023-02-11 DIAGNOSIS — Z9841 Cataract extraction status, right eye: Secondary | ICD-10-CM

## 2023-02-11 DIAGNOSIS — G250 Essential tremor: Secondary | ICD-10-CM | POA: Diagnosis present

## 2023-02-11 DIAGNOSIS — Z1152 Encounter for screening for COVID-19: Secondary | ICD-10-CM | POA: Diagnosis not present

## 2023-02-11 DIAGNOSIS — M810 Age-related osteoporosis without current pathological fracture: Secondary | ICD-10-CM | POA: Diagnosis present

## 2023-02-11 DIAGNOSIS — Z955 Presence of coronary angioplasty implant and graft: Secondary | ICD-10-CM | POA: Diagnosis not present

## 2023-02-11 DIAGNOSIS — E782 Mixed hyperlipidemia: Secondary | ICD-10-CM | POA: Diagnosis not present

## 2023-02-11 DIAGNOSIS — Z96653 Presence of artificial knee joint, bilateral: Secondary | ICD-10-CM | POA: Diagnosis not present

## 2023-02-11 DIAGNOSIS — Z8541 Personal history of malignant neoplasm of cervix uteri: Secondary | ICD-10-CM

## 2023-02-11 DIAGNOSIS — K430 Incisional hernia with obstruction, without gangrene: Secondary | ICD-10-CM | POA: Diagnosis not present

## 2023-02-11 DIAGNOSIS — Z86718 Personal history of other venous thrombosis and embolism: Secondary | ICD-10-CM

## 2023-02-11 DIAGNOSIS — Z9889 Other specified postprocedural states: Secondary | ICD-10-CM

## 2023-02-11 DIAGNOSIS — R0781 Pleurodynia: Secondary | ICD-10-CM | POA: Diagnosis not present

## 2023-02-11 DIAGNOSIS — R059 Cough, unspecified: Secondary | ICD-10-CM | POA: Diagnosis not present

## 2023-02-11 DIAGNOSIS — Z888 Allergy status to other drugs, medicaments and biological substances status: Secondary | ICD-10-CM

## 2023-02-11 DIAGNOSIS — R509 Fever, unspecified: Secondary | ICD-10-CM | POA: Diagnosis not present

## 2023-02-11 DIAGNOSIS — R109 Unspecified abdominal pain: Secondary | ICD-10-CM | POA: Diagnosis present

## 2023-02-11 DIAGNOSIS — I5030 Unspecified diastolic (congestive) heart failure: Secondary | ICD-10-CM | POA: Diagnosis not present

## 2023-02-11 DIAGNOSIS — J449 Chronic obstructive pulmonary disease, unspecified: Secondary | ICD-10-CM | POA: Diagnosis not present

## 2023-02-11 DIAGNOSIS — I10 Essential (primary) hypertension: Secondary | ICD-10-CM

## 2023-02-11 DIAGNOSIS — Z9842 Cataract extraction status, left eye: Secondary | ICD-10-CM | POA: Diagnosis not present

## 2023-02-11 DIAGNOSIS — J9811 Atelectasis: Secondary | ICD-10-CM | POA: Diagnosis not present

## 2023-02-11 DIAGNOSIS — R9389 Abnormal findings on diagnostic imaging of other specified body structures: Secondary | ICD-10-CM | POA: Diagnosis not present

## 2023-02-11 DIAGNOSIS — Z90710 Acquired absence of both cervix and uterus: Secondary | ICD-10-CM

## 2023-02-11 HISTORY — PX: VENTRAL HERNIA REPAIR: SHX424

## 2023-02-11 HISTORY — PX: LYSIS OF ADHESION: SHX5961

## 2023-02-11 LAB — GLUCOSE, CAPILLARY
Glucose-Capillary: 130 mg/dL — ABNORMAL HIGH (ref 70–99)
Glucose-Capillary: 128 mg/dL — ABNORMAL HIGH (ref 70–99)
Glucose-Capillary: 163 mg/dL — ABNORMAL HIGH (ref 70–99)

## 2023-02-11 SURGERY — REPAIR, HERNIA, VENTRAL, LAPAROSCOPIC
Anesthesia: General

## 2023-02-11 MED ORDER — FENTANYL CITRATE (PF) 100 MCG/2ML IJ SOLN
25.0000 ug | INTRAMUSCULAR | Status: DC | PRN
  Administered 2023-02-11 (×3): 50 ug via INTRAVENOUS

## 2023-02-11 MED ORDER — FAMOTIDINE 20 MG PO TABS
20.0000 mg | ORAL_TABLET | Freq: Every day | ORAL | Status: DC
  Administered 2023-02-12: 20 mg via ORAL
  Filled 2023-02-11 (×2): qty 1

## 2023-02-11 MED ORDER — METOPROLOL SUCCINATE ER 25 MG PO TB24
12.5000 mg | ORAL_TABLET | Freq: Every day | ORAL | Status: DC
  Filled 2023-02-11 (×3): qty 1

## 2023-02-11 MED ORDER — MAGNESIUM OXIDE -MG SUPPLEMENT 400 (240 MG) MG PO TABS
200.0000 mg | ORAL_TABLET | Freq: Every morning | ORAL | Status: DC
  Administered 2023-02-12 – 2023-02-13 (×2): 200 mg via ORAL
  Filled 2023-02-11 (×2): qty 1

## 2023-02-11 MED ORDER — OXYCODONE HCL 5 MG/5ML PO SOLN: 5.0000 mg | Freq: Once | ORAL | Status: AC | PRN

## 2023-02-11 MED ORDER — FENTANYL CITRATE (PF) 250 MCG/5ML IJ SOLN
INTRAMUSCULAR | Status: DC | PRN
  Administered 2023-02-11 (×3): 50 ug via INTRAVENOUS

## 2023-02-11 MED ORDER — ONDANSETRON HCL 4 MG/2ML IJ SOLN
INTRAMUSCULAR | Status: AC
  Filled 2023-02-11: qty 2

## 2023-02-11 MED ORDER — PROPOFOL 10 MG/ML IV BOLUS
INTRAVENOUS | Status: AC
  Filled 2023-02-11: qty 20

## 2023-02-11 MED ORDER — ACETAMINOPHEN 160 MG/5ML PO SOLN: 325.0000 mg | ORAL | Status: DC | PRN

## 2023-02-11 MED ORDER — INSULIN ASPART 100 UNIT/ML IJ SOLN: 0.0000 [IU] | INTRAMUSCULAR | Status: DC | PRN

## 2023-02-11 MED ORDER — PRAMIPEXOLE DIHYDROCHLORIDE 0.25 MG PO TABS
0.2500 mg | ORAL_TABLET | Freq: Two times a day (BID) | ORAL | Status: DC
  Administered 2023-02-11 – 2023-02-13 (×4): 0.25 mg via ORAL
  Filled 2023-02-11 (×4): qty 1

## 2023-02-11 MED ORDER — ONDANSETRON 4 MG PO TBDP: 4.0000 mg | ORAL_TABLET | Freq: Four times a day (QID) | ORAL | Status: DC | PRN

## 2023-02-11 MED ORDER — DEXAMETHASONE SODIUM PHOSPHATE 10 MG/ML IJ SOLN
INTRAMUSCULAR | Status: DC | PRN
  Administered 2023-02-11: 5 mg via INTRAVENOUS

## 2023-02-11 MED ORDER — OXYCODONE HCL 5 MG PO TABS
5.0000 mg | ORAL_TABLET | Freq: Once | ORAL | Status: AC | PRN
  Administered 2023-02-11: 5 mg via ORAL

## 2023-02-11 MED ORDER — ORAL CARE MOUTH RINSE: 15.0000 mL | Freq: Once | OROMUCOSAL | Status: AC

## 2023-02-11 MED ORDER — LOSARTAN POTASSIUM 50 MG PO TABS
50.0000 mg | ORAL_TABLET | Freq: Two times a day (BID) | ORAL | Status: DC
  Administered 2023-02-11 – 2023-02-12 (×2): 50 mg via ORAL
  Filled 2023-02-11 (×4): qty 1

## 2023-02-11 MED ORDER — OXYCODONE HCL 5 MG PO TABS
ORAL_TABLET | ORAL | Status: AC
  Filled 2023-02-11: qty 1

## 2023-02-11 MED ORDER — CEFAZOLIN SODIUM-DEXTROSE 2-4 GM/100ML-% IV SOLN
2.0000 g | Freq: Once | INTRAVENOUS | Status: AC
  Administered 2023-02-11: 2 g via INTRAVENOUS

## 2023-02-11 MED ORDER — VISTASEAL 10 ML SINGLE DOSE KIT
10.0000 mL | PACK | Freq: Once | CUTANEOUS | Status: AC
  Administered 2023-02-11: 10 mL via TOPICAL
  Filled 2023-02-11: qty 10

## 2023-02-11 MED ORDER — SIMVASTATIN 20 MG PO TABS
20.0000 mg | ORAL_TABLET | Freq: Every day | ORAL | Status: DC
  Administered 2023-02-11 – 2023-02-12 (×2): 20 mg via ORAL
  Filled 2023-02-11 (×2): qty 1

## 2023-02-11 MED ORDER — CHLORHEXIDINE GLUCONATE 0.12 % MT SOLN
15.0000 mL | Freq: Once | OROMUCOSAL | Status: AC
  Administered 2023-02-11: 15 mL via OROMUCOSAL
  Filled 2023-02-11: qty 15

## 2023-02-11 MED ORDER — ACETAMINOPHEN 325 MG PO TABS: 325.0000 mg | ORAL_TABLET | ORAL | Status: DC | PRN

## 2023-02-11 MED ORDER — MEPERIDINE HCL 25 MG/ML IJ SOLN: 6.2500 mg | INTRAMUSCULAR | Status: DC | PRN

## 2023-02-11 MED ORDER — ONDANSETRON HCL 4 MG/2ML IJ SOLN
INTRAMUSCULAR | Status: DC | PRN
  Administered 2023-02-11: 4 mg via INTRAVENOUS

## 2023-02-11 MED ORDER — ACETAMINOPHEN 500 MG PO TABS: 1000.0000 mg | ORAL_TABLET | Freq: Four times a day (QID) | ORAL | Status: DC | PRN

## 2023-02-11 MED ORDER — ONDANSETRON HCL 4 MG/2ML IJ SOLN
4.0000 mg | Freq: Four times a day (QID) | INTRAMUSCULAR | Status: DC | PRN
  Administered 2023-02-13: 4 mg via INTRAVENOUS
  Filled 2023-02-11: qty 2

## 2023-02-11 MED ORDER — FENTANYL CITRATE (PF) 100 MCG/2ML IJ SOLN
INTRAMUSCULAR | Status: AC
  Filled 2023-02-11: qty 2

## 2023-02-11 MED ORDER — DEXAMETHASONE SODIUM PHOSPHATE 10 MG/ML IJ SOLN
INTRAMUSCULAR | Status: AC
  Filled 2023-02-11: qty 1

## 2023-02-11 MED ORDER — ONDANSETRON HCL 4 MG/2ML IJ SOLN: 4.0000 mg | Freq: Once | INTRAMUSCULAR | Status: DC | PRN

## 2023-02-11 MED ORDER — FENTANYL CITRATE (PF) 250 MCG/5ML IJ SOLN
INTRAMUSCULAR | Status: AC
  Filled 2023-02-11: qty 5

## 2023-02-11 MED ORDER — BUPIVACAINE HCL (PF) 0.25 % IJ SOLN
INTRAMUSCULAR | Status: AC
  Filled 2023-02-11: qty 30

## 2023-02-11 MED ORDER — INSULIN ASPART 100 UNIT/ML IJ SOLN: 0.0000 [IU] | Freq: Three times a day (TID) | INTRAMUSCULAR | Status: DC

## 2023-02-11 MED ORDER — ROCURONIUM BROMIDE 10 MG/ML (PF) SYRINGE
PREFILLED_SYRINGE | INTRAVENOUS | Status: AC
  Filled 2023-02-11: qty 10

## 2023-02-11 MED ORDER — ISOSORBIDE MONONITRATE ER 60 MG PO TB24
60.0000 mg | ORAL_TABLET | Freq: Every day | ORAL | Status: DC
  Administered 2023-02-12 – 2023-02-13 (×2): 60 mg via ORAL
  Filled 2023-02-11 (×2): qty 1

## 2023-02-11 MED ORDER — POLYVINYL ALCOHOL 1.4 % OP SOLN
1.0000 [drp] | Freq: Four times a day (QID) | OPHTHALMIC | Status: DC
  Filled 2023-02-11: qty 15

## 2023-02-11 MED ORDER — SODIUM CHLORIDE 0.9 % IV SOLN: INTRAVENOUS | Status: DC

## 2023-02-11 MED ORDER — LIDOCAINE 2% (20 MG/ML) 5 ML SYRINGE
INTRAMUSCULAR | Status: DC | PRN
  Administered 2023-02-11: 60 mg via INTRAVENOUS

## 2023-02-11 MED ORDER — SUGAMMADEX SODIUM 200 MG/2ML IV SOLN
INTRAVENOUS | Status: DC | PRN
  Administered 2023-02-11 (×3): 50 mg via INTRAVENOUS

## 2023-02-11 MED ORDER — BUPIVACAINE HCL 0.25 % IJ SOLN
INTRAMUSCULAR | Status: DC | PRN
  Administered 2023-02-11: 5 mL

## 2023-02-11 MED ORDER — LACTATED RINGERS IV SOLN: INTRAVENOUS | Status: DC

## 2023-02-11 MED ORDER — ACETAMINOPHEN 10 MG/ML IV SOLN
INTRAVENOUS | Status: DC | PRN
  Administered 2023-02-11: 1000 mg via INTRAVENOUS

## 2023-02-11 MED ORDER — AMLODIPINE BESYLATE 10 MG PO TABS
10.0000 mg | ORAL_TABLET | Freq: Every day | ORAL | Status: DC
  Filled 2023-02-11 (×3): qty 1

## 2023-02-11 MED ORDER — ACETAMINOPHEN 10 MG/ML IV SOLN
INTRAVENOUS | Status: AC
  Filled 2023-02-11: qty 100

## 2023-02-11 MED ORDER — ROCURONIUM BROMIDE 10 MG/ML (PF) SYRINGE
PREFILLED_SYRINGE | INTRAVENOUS | Status: DC | PRN
  Administered 2023-02-11: 50 mg via INTRAVENOUS

## 2023-02-11 MED ORDER — HYDROCODONE-ACETAMINOPHEN 5-325 MG PO TABS
1.0000 | ORAL_TABLET | ORAL | Status: DC | PRN
  Administered 2023-02-12 (×2): 1 via ORAL
  Filled 2023-02-11 (×3): qty 1

## 2023-02-11 MED ORDER — FUROSEMIDE 20 MG PO TABS: 20.0000 mg | ORAL_TABLET | Freq: Every day | ORAL | Status: DC | PRN

## 2023-02-11 MED ORDER — VITAMIN D 25 MCG (1000 UNIT) PO TABS
1000.0000 [IU] | ORAL_TABLET | Freq: Every day | ORAL | Status: DC
  Administered 2023-02-11 – 2023-02-13 (×3): 1000 [IU] via ORAL
  Filled 2023-02-11 (×3): qty 1

## 2023-02-11 MED ORDER — HYDROMORPHONE HCL 1 MG/ML IJ SOLN
0.5000 mg | INTRAMUSCULAR | Status: DC | PRN
  Administered 2023-02-11 (×2): 0.5 mg via INTRAVENOUS
  Filled 2023-02-11 (×2): qty 0.5

## 2023-02-11 MED ORDER — CEFAZOLIN SODIUM-DEXTROSE 2-4 GM/100ML-% IV SOLN
INTRAVENOUS | Status: AC
  Filled 2023-02-11: qty 100

## 2023-02-11 MED ORDER — PROPOFOL 10 MG/ML IV BOLUS
INTRAVENOUS | Status: DC | PRN
  Administered 2023-02-11: 130 mg via INTRAVENOUS
  Administered 2023-02-11: 30 mg via INTRAVENOUS

## 2023-02-11 MED ORDER — METHOCARBAMOL 500 MG PO TABS
500.0000 mg | ORAL_TABLET | Freq: Four times a day (QID) | ORAL | Status: DC | PRN
  Administered 2023-02-12 – 2023-02-13 (×2): 500 mg via ORAL
  Filled 2023-02-11 (×3): qty 1

## 2023-02-11 SURGICAL SUPPLY — 68 items
ADH SKN CLS APL DERMABOND .7 (GAUZE/BANDAGES/DRESSINGS) ×1
APL PRP STRL LF DISP 70% ISPRP (MISCELLANEOUS) ×1
APPLIER CLIP 5 13 M/L LIGAMAX5 (MISCELLANEOUS)
APR CLP MED LRG 5 ANG JAW (MISCELLANEOUS)
BAG COUNTER SPONGE SURGICOUNT (BAG) ×1 IMPLANT
BAG SPNG CNTER NS LX DISP (BAG) ×1
BINDER ABDOMINAL 12 ML 46-62 (SOFTGOODS) ×1 IMPLANT
BLADE CLIPPER SURG (BLADE) IMPLANT
CANISTER SUCT 3000ML PPV (MISCELLANEOUS) IMPLANT
CHLORAPREP W/TINT 26 (MISCELLANEOUS) ×1 IMPLANT
CLIP APPLIE 5 13 M/L LIGAMAX5 (MISCELLANEOUS) IMPLANT
COVER SURGICAL LIGHT HANDLE (MISCELLANEOUS) ×1 IMPLANT
DERMABOND ADVANCED .7 DNX12 (GAUZE/BANDAGES/DRESSINGS) ×1 IMPLANT
DEVICE SECURE STRAP 25 ABSORB (INSTRUMENTS) ×1 IMPLANT
DEVICE TROCAR PUNCTURE CLOSURE (ENDOMECHANICALS) ×1 IMPLANT
DRAIN CHANNEL 19F RND (DRAIN) IMPLANT
DRAPE INCISE IOBAN 66X45 STRL (DRAPES) ×1 IMPLANT
DRAPE LAPAROSCOPIC ABDOMINAL (DRAPES) ×1 IMPLANT
DRSG OPSITE POSTOP 4X10 (GAUZE/BANDAGES/DRESSINGS) IMPLANT
DRSG OPSITE POSTOP 4X6 (GAUZE/BANDAGES/DRESSINGS) IMPLANT
DRSG OPSITE POSTOP 4X8 (GAUZE/BANDAGES/DRESSINGS) IMPLANT
ELECT CAUTERY BLADE 6.4 (BLADE) ×1 IMPLANT
ELECT REM PT RETURN 9FT ADLT (ELECTROSURGICAL) ×1
ELECTRODE REM PT RTRN 9FT ADLT (ELECTROSURGICAL) ×1 IMPLANT
EVACUATOR SILICONE 100CC (DRAIN) IMPLANT
GLOVE BIO SURGEON STRL SZ7.5 (GLOVE) ×2 IMPLANT
GLOVE BIOGEL PI IND STRL 8 (GLOVE) ×1 IMPLANT
GOWN STRL REUS W/ TWL LRG LVL3 (GOWN DISPOSABLE) ×2 IMPLANT
GOWN STRL REUS W/ TWL XL LVL3 (GOWN DISPOSABLE) ×1 IMPLANT
GOWN STRL REUS W/TWL LRG LVL3 (GOWN DISPOSABLE) ×2
GOWN STRL REUS W/TWL XL LVL3 (GOWN DISPOSABLE) ×1
GRASPER SUT TROCAR 14GX15 (MISCELLANEOUS) IMPLANT
IRRIG SUCT STRYKERFLOW 2 WTIP (MISCELLANEOUS)
IRRIGATION SUCT STRKRFLW 2 WTP (MISCELLANEOUS) IMPLANT
KIT BASIN OR (CUSTOM PROCEDURE TRAY) ×1 IMPLANT
KIT TURNOVER KIT B (KITS) ×1 IMPLANT
MESH SOFT 12X12IN BARD (Mesh General) IMPLANT
NDL INSUFFLATION 14GA 120MM (NEEDLE) ×1 IMPLANT
NEEDLE INSUFFLATION 14GA 120MM (NEEDLE) ×1
NS IRRIG 1000ML POUR BTL (IV SOLUTION) ×1 IMPLANT
PACK GENERAL/GYN (CUSTOM PROCEDURE TRAY) ×1 IMPLANT
PACK LAPAROSCOPY BASIN (CUSTOM PROCEDURE TRAY) IMPLANT
PAD ARMBOARD 7.5X6 YLW CONV (MISCELLANEOUS) ×2 IMPLANT
PENCIL SMOKE EVACUATOR (MISCELLANEOUS) ×1 IMPLANT
POUCH LAPAROSCOPIC INSTRUMENT (MISCELLANEOUS) ×1 IMPLANT
SCISSORS LAP 5X35 DISP (ENDOMECHANICALS) ×1 IMPLANT
SET TUBE SMOKE EVAC HIGH FLOW (TUBING) ×1 IMPLANT
SHEARS HARMONIC ACE PLUS 36CM (ENDOMECHANICALS) IMPLANT
SLEEVE Z-THREAD 5X100MM (TROCAR) ×1 IMPLANT
STAPLER VISISTAT 35W (STAPLE) IMPLANT
SUT CHROMIC 2 0 SH (SUTURE) ×1 IMPLANT
SUT ETHIBOND 0 MO6 C/R (SUTURE) IMPLANT
SUT ETHILON 2 0 FS 18 (SUTURE) IMPLANT
SUT MNCRL AB 4-0 PS2 18 (SUTURE) ×1 IMPLANT
SUT NOVA 1 T20/GS 25DT (SUTURE) IMPLANT
SUT PDS AB 1 TP1 54 (SUTURE) IMPLANT
SUT PDS AB 2-0 CT1 27 (SUTURE) IMPLANT
SUT PROLENE 2 0 KS (SUTURE) IMPLANT
SUT VIC AB 3-0 SH 18 (SUTURE) IMPLANT
TOWEL GREEN STERILE (TOWEL DISPOSABLE) ×1 IMPLANT
TOWEL GREEN STERILE FF (TOWEL DISPOSABLE) ×1 IMPLANT
TRAY FOLEY W/BAG SLVR 16FR (SET/KITS/TRAYS/PACK)
TRAY FOLEY W/BAG SLVR 16FR ST (SET/KITS/TRAYS/PACK) IMPLANT
TRAY LAPAROSCOPIC MC (CUSTOM PROCEDURE TRAY) ×1 IMPLANT
TROCAR 11X100 Z THREAD (TROCAR) IMPLANT
TROCAR Z-THREAD OPTICAL 5X100M (TROCAR) ×1 IMPLANT
WARMER LAPAROSCOPE (MISCELLANEOUS) ×1 IMPLANT
WATER STERILE IRR 1000ML POUR (IV SOLUTION) ×1 IMPLANT

## 2023-02-11 NOTE — Progress Notes (Signed)
   02/11/23 2040  Provider Notification  Provider Name/Title Kris Mouton ,MD  Date Provider Notified 02/11/23  Time Provider Notified 2041  Method of Notification Page  Notification Reason Other (Comment) (Patient  has a bruise to left side of her abdomen observed during assessment,MD informed)  Provider response Other (Comment) (awaiting response from MD)  Date of Provider Response 02/11/23

## 2023-02-11 NOTE — Progress Notes (Signed)
Scheduled metoprolol and norvasc held per Dr. Derrell Lolling d/t asymptomatic BP 134/51.

## 2023-02-11 NOTE — Op Note (Signed)
02/11/2023  1:39 PM  PATIENT:  Heather [name]  88 y.o. female  PRE-OPERATIVE DIAGNOSIS:  RECURRENT INCISIONAL HERNIA  POST-OPERATIVE DIAGNOSIS:  RECURRENT INCISIONAL HERNIA, adhesions  PROCEDURE:  Procedure(s) with comments: Laparoscopic LYSIS OF ADHESION  x 56min(N/A) OPEN VENTRAL HERNIA REPAIR WITH MESH, incarcerated, retrorectus with 20 x 10 cm Bard soft mesh   SURGEON:  Surgeons and Role:    Axel Filler, MD - Primary  ASSISTANTS: Jeronimo Greaves, RNFA   ANESTHESIA:   local and general  EBL:  minimal   BLOOD ADMINISTERED:none  DRAINS: none   LOCAL MEDICATIONS USED:  NONE  SPECIMEN:  No Specimen  DISPOSITION OF SPECIMEN:  N/A  COUNTS:  YES  TOURNIQUET:  * No tourniquets in log *  DICTATION: .Dragon Dictation Indication for procedure: Patient is a 88 year old female with history of previous hernia repair.  Patient subsequently developed a recurrent ventral hernia.  This was approximately 8 cm across.  Patient also with what appeared to be incarcerated colon within the hernia sac on CT scan.  Patient was counseled on secondary to pain she wanted to proceed to laparoscopic lysis of adhesions and open incisional hernia pair with mesh.  Findings: Patient with a 8 x 10 cm hernia.  There was a large hernia sac.  There was also incarcerated colon that was taken down bluntly from the anterior abdominal wall and hernia sac.  Details of the procedure: After the patient was consented he was taken back to the operating room and placed in the supine position with bilateral SCDs in place. The patient was prepped and draped in usual sterile fashion. Antibiotics were confirmed and timeout was called and all facts verified.  A Veress needle technique was used to insufflate the abdomen to 15 mmHg in the left subcostal margin.  Subsequent to this a 5 mm trocar and camera placed intra-abdominal.  There is no injury to any intra-abdominal organs.  There is large amount of omental  adhesions to the midline that could be seen.  At this time a second 5 mm trocar was placed in the left lower quadrant under direct visualization.  This time I proceeded to take down the omental adhesions sharply and bluntly.  It was evident there was a portion of transverse colon that was incarcerated into the hernia.  This was taken down both sharply and bluntly.  At this time the area was clear in the midline.  I decided to proceed with a open hernia repair.  Adhesiolysis was approximate 30 minutes.  At this time I proceeded to excise the skin scar. This was discarded. I proceeded to use electrocautery to maintain hemostasis and dissection took place in the most superior portion of the wound down to the subcutaneous tissues fat to the anterior fascia. This was incised. The fascia was elevated and 2 Kocher clamps.  The hernia sac and the abdominal cavity were entered bluntly.  The fascia was incised distal to the hernia approximately 4 cm.  The hernia was seen to be approximately 8 x10 cm.   The hernia sacs were excised.    At this time I proceeded to retract is rectus muscles medially.  At this time the posterior fascia was then incised.  Using blunt dissection the belly was dissected away from the posterior rectus fascia.  This was done inferiorly and superiorly.  At the midline inferior and superior portions of the linea alba this was taken down from the anterior abdominal wall.  This was done bilaterally.  This  was done both cephalad and caudad approximately 4 cm.  The area was checked for hemostasis.  At this time the posterior rectus fascia was reapproximated using #1 PDS in a standard running fashion x2.  I proceeded to irrigate out the retrorectus space.  The area was measured and seemed to be approximately 20 x 15 cm in size.  A piece of Bard soft mesh was selected and cut to shape and placed into the retrorectus space.  This fit well and flat.  0 Novofils was used x 4 via stab incisions and a Endo  Close device as transvesical sutures bilaterally x 2.  Tisseel glue was then used to fasten the mesh to the midline fascia.  At this time the anterior fascia and midline were reapproximated using #1 PDS in a standard running fashion x2.  The subcutaneous tissue was irrigated out with sterile saline.  The subcutaneous tissue was then reapproximated using 3-0 Vicryl's in interrupted fashion.  She also the skin was then reapproximated using 3-0 Monocryl in subcuticular fashion.  The midline wound was then dressed with honeycomb dressing.  The patient tolerated procedure well was taken to the recovery room in stable condition.   PLAN OF CARE: Admit for overnight observation  PATIENT DISPOSITION:  PACU - hemodynamically stable.   Delay start of Pharmacological VTE agent (>24hrs) due to surgical blood loss or risk of bleeding: not applicable

## 2023-02-11 NOTE — Plan of Care (Signed)

## 2023-02-11 NOTE — Anesthesia Procedure Notes (Signed)
Procedure Name: Intubation Date/Time: 02/11/2023 12:20 PM  Performed by: Aundria Rud, CRNAPre-anesthesia Checklist: Patient identified, Emergency Drugs available, Suction available and Patient being monitored Patient Re-evaluated:Patient Re-evaluated prior to induction Oxygen Delivery Method: Circle System Utilized Preoxygenation: Pre-oxygenation with 100% oxygen Induction Type: IV induction Ventilation: Mask ventilation without difficulty Laryngoscope Size: Mac and 3 Grade View: Grade II Tube type: Oral Number of attempts: 1 Airway Equipment and Method: Stylet Placement Confirmation: ETT inserted through vocal cords under direct vision, positive ETCO2 and breath sounds checked- equal and bilateral Secured at: 21 cm Tube secured with: Tape Dental Injury: Teeth and Oropharynx as per pre-operative assessment

## 2023-02-11 NOTE — H&P (Signed)
Chief Complaint: Follow-up (Inguinal hernia)   History of Present Illness: Heather [name] is a 88 y.o. female who is seen today for follow-up today for incisional midline hernia. Patient states that she has had some increasing signs of pain discomfort to the hernia. She feels that its gotten somewhat larger. She continues to have on and off symptoms of discomfort and pain to the area.  She has been wearing her abdominal binder as suggested. Her and her daughter state this may be minimally helping at this point.  Patient did see Dr. Excell Seltzer recently. He does state that she would not necessarily be advised not to have surgery, but would require coordination with cardiology postoperatively with fluid requirements.    Review of Systems: A complete review of systems was obtained from the patient. I have reviewed this information and discussed as appropriate with the patient. See HPI as well for other ROS.  ROS   Medical History: Past Medical History:  Diagnosis Date  Arrhythmia  Arthritis  CHF (congestive heart failure) (CMS/HHS-HCC)  DVT (deep venous thrombosis) (CMS/HHS-HCC)  Esophageal varices (CMS/HHS-HCC)  GERD (gastroesophageal reflux disease)  Heart valve disease  HIV -AIDS with opportunistic infection, Symptomatic (CMS/HHS-HCC)  Hyperlipidemia  Hypertension  Thyroid disease   There is no problem list on file for this patient.  Past Surgical History:  Procedure Laterality Date  bladder prolapse N/A  CHOLECYSTECTOMY OPEN N/A  esophageal hernia repair surgery N/A  HYSTERECTOMY  REPLACEMENT TOTAL KNEE N/A    Allergies  Allergen Reactions  Baclofen Other (See Comments) and Rash  Hyperactivity Hyperactivity  Nitrofurantoin Rash   Current Outpatient Medications on File Prior to Visit  Medication Sig Dispense Refill  acetaminophen (TYLENOL) 500 MG tablet Take by mouth  albuterol 90 mcg/actuation inhaler albuterol sulfate HFA 90 mcg/actuation aerosol inhaler (Patient  not taking: Reported on 12/21/2022)  amLODIPine (NORVASC) 10 MG tablet  ascorbic acid, vitamin C, 500 mg Chew Take by mouth  azithromycin (ZITHROMAX) 250 MG tablet azithromycin 250 mg tablet (Patient not taking: Reported on 12/21/2022)  benzonatate (TESSALON) 200 MG capsule benzonatate 200 mg capsule TAKE 1 CAPSULE BY MOUTH 3 TIMES DAILY AS NEEDED FOR UP TO 7 DAYS FOR COUGH. (Patient not taking: Reported on 12/21/2022)  besifloxacin (BESIVANCE) 0.6 % Besivance 0.6 % eye drops,suspension  cefdinir (OMNICEF) 300 mg capsule  cephalexin (KEFLEX) 500 MG capsule cephalexin 500 mg capsule TAKE 1 CAPSULE BY MOUTH EVERY 8 HOURS FOR 10 DAYS  cetirizine (ZYRTEC) 10 MG tablet cetirizine 10 mg tablet (Patient not taking: Reported on 12/21/2022)  cholecalciferol (VITAMIN D3) 2,000 unit capsule Take by mouth (Patient not taking: Reported on 12/21/2022)  ciprofloxacin HCl (CIPRO) 250 MG tablet Cipro 250 mg tablet Take 1 tablet twice a day by oral route for 10 days. (Patient not taking: Reported on 12/21/2022)  conjugated estrogens (PREMARIN) 0.625 mg/gram vaginal cream Premarin 0.625 mg/gram vaginal cream  diclofenac (VOLTAREN) 1 % topical gel Apply topically  doxycycline (VIBRA-TABS) 100 MG tablet doxycycline hyclate 100 mg tablet (Patient not taking: Reported on 12/21/2022)  eszopiclone (LUNESTA) 2 MG tablet eszopiclone 2 mg tablet  famotidine (PEPCID) 20 MG tablet Take by mouth  FUROsemide (LASIX) 20 MG tablet  HYDROcodone-acetaminophen (NORCO) 5-325 mg tablet hydrocodone 5 mg-acetaminophen 325 mg tablet  hyoscyamine (LEVSIN/SL) 0.125 mg SL tablet hyoscyamine 0.125 mg sublingual tablet  isosorbide mononitrate (IMDUR) 60 MG ER tablet isosorbide mononitrate ER 60 mg tablet,extended release 24 hr  ketorolac (TORADOL) 10 mg tablet Take 10 mg by mouth every 6 (six) hours as  needed  levoFLOXacin (LEVAQUIN) 500 MG tablet levofloxacin 500 mg tablet (Patient not taking: Reported on 05/02/2021)  losartan (COZAAR) 100 MG  tablet losartan 100 mg tablet  losartan (COZAAR) 50 MG tablet Take 50 mg by mouth once daily (Patient not taking: Reported on 05/02/2021)  magnesium oxide (MAG-OX) 400 mg (241.3 mg magnesium) tablet Take by mouth  metoprolol succinate (TOPROL-XL) 100 MG XL tablet metoprolol succinate ER 100 mg tablet,extended release 24 hr  MYRBETRIQ 25 mg ER Tablet (Patient not taking: Reported on 05/02/2021)  nepafenac (ILEVRO) 0.3 % DrpS Ilevro 0.3 % eye drops,suspension  nitroGLYcerin (NITROSTAT) 0.4 MG SL tablet nitroglycerin 0.4 mg sublingual tablet PLACE 1 TABLET UNDER THE TONGUE EVERY 5 MINUTES AS NEEDED FOR CHEST PAIN.  omeprazole (PRILOSEC) 20 MG DR capsule omeprazole 20 mg capsule,delayed release  ondansetron (ZOFRAN-ODT) 4 MG disintegrating tablet ondansetron 4 mg disintegrating tablet  oxybutynin (DITROPAN) 5 mg tablet oxybutynin chloride 5 mg tablet TAKE 1 TABLET BY MOUTH TWICE A DAY  oxyCODONE (ROXICODONE) 5 MG immediate release tablet oxycodone 5 mg tablet (Patient not taking: Reported on 05/02/2021)  phenazopyridine (PYRIDIUM) 200 MG tablet phenazopyridine 200 mg tablet  potassium chloride (KLOR-CON) 20 MEQ ER tablet potassium chloride ER 20 mEq tablet,extended release(part/cryst)  pramipexole (MIRAPEX) 0.25 MG tablet pramipexole 0.25 mg tablet  predniSONE (DELTASONE) 5 MG tablet as directed (Patient not taking: Reported on 05/02/2021)  simvastatin (ZOCOR) 20 MG tablet simvastatin 20 mg tablet  solifenacin (VESICARE) 10 MG tablet daily (Patient not taking: Reported on 05/02/2021)  sulfamethoxazole-trimethoprim (BACTRIM DS) 800-160 mg tablet sulfamethoxazole 800 mg-trimethoprim 160 mg tablet TAKE 1 TABLET BY MOUTH TWICE A DAY FOR 3 DAYS (Patient not taking: Reported on 05/02/2021)  traMADoL (ULTRAM) 50 mg tablet Take by mouth   No current facility-administered medications on file prior to visit.   Family History  Problem Relation Age of Onset  High blood pressure (Hypertension) Mother   Hyperlipidemia (Elevated cholesterol) Mother  Myocardial Infarction (Heart attack) Mother  Stroke Father  High blood pressure (Hypertension) Father  Hyperlipidemia (Elevated cholesterol) Father    Social History   Tobacco Use  Smoking Status Never  Smokeless Tobacco Never    Social History   Socioeconomic History  Marital status: Widowed  Tobacco Use  Smoking status: Never  Smokeless tobacco: Never  Vaping Use  Vaping status: Never Used  Substance and Sexual Activity  Alcohol use: Never  Drug use: Never   Social Determinants of Health   Financial Resource Strain: Low Risk (12/12/2022)  Received from [facility]  Overall Financial Resource Strain (CARDIA)  Difficulty of Paying Living Expenses: Not hard at all  Food Insecurity: No Food Insecurity (12/12/2022)  Received from [facility]  Hunger Vital Sign  Worried About Running Out of Food in the Last Year: Never true  Ran Out of Food in the Last Year: Never true  Transportation Needs: No Transportation Needs (12/12/2022)  Received from [facility] - Transportation  Lack of Transportation (Medical): No  Lack of Transportation (Non-Medical): No  Physical Activity: Inactive (12/12/2022)  Received from [facility]  Exercise Vital Sign  Days of Exercise per Week: 0 days  Minutes of Exercise per Session: 0 min  Stress: Stress Concern Present (12/12/2022)  Received from [facility] - Occupational Stress Questionnaire  Feeling of Stress : Rather much  Social Connections: Moderately Isolated (12/12/2022)  Received from [facility]  Social Connection and Isolation Panel [NHANES]  Frequency of Communication with Friends and Family:  More than three times a week  Frequency of Social Gatherings with Friends and Family: Once a week  Attends Religious Services: More than 4 times per year  Active Member of [employer] or Organizations: No  Attends Banker Meetings: Never   Marital Status: Widowed   Objective:  BP (!) 155/67   Pulse 65   Temp 98.2 F (36.8 C) (Oral)   Resp 18   Ht 5\' 2"  (1.575 m)   Wt 67 kg   LMP  (LMP Unknown)   SpO2 95%   BMI 27.00 kg/m     Physical Exam  PE:  Constitutional: No acute distress, conversant, appears states age. Eyes: Anicteric sclerae, moist conjunctiva, no lid lag Lungs: Clear to auscultation bilaterally, normal respiratory effort CV: regular rate and rhythm, no murmurs, no peripheral edema, pedal pulses 2+ GI: Soft, no masses or hepatosplenomegaly, non-tender to palpation, midline incisional hernia, approximately 5 to 6 cm wide. Skin: No rashes, palpation reveals normal turgor Psychiatric: appropriate judgment and insight, oriented to person, place, and time     Assessment and Plan:   Diagnoses and all orders for this visit:  Incisional hernia without obstruction or gangrene  Chronic diastolic congestive heart failure (CMS/HHS-HCC)    1. Long patient with the patient and her daughter in regards to the hernia. This is reducible in the office. We did discuss changing the binder to a possible compression garment. I did discuss with the patient that as long as this is reducible she should be out of any type of danger. 2. I discussed with the patient and her daughter she would like to have surgery we can definitely coordinate with anesthesia and cardiology in regards to her surgery. I discussed with her that it is difficult to assess some of scar tissue she has it would require adhesiolysis prior to hernia repair at the time of surgery. I can only guess that surgery for her would be anywhere between an hour and 1/2-2 and half hours to repair this hernia. She would likely require postoperative hospital stay with cardiology consultation and been in the hospital for 2 to 3 days after surgery. 3. Patient is unsure that she wants to have surgery and will call us back if she decides.  Return if symptoms worsen or  fail to improve.  Axel Filler, MD

## 2023-02-11 NOTE — Consult Note (Signed)
Cardiology Consultation   Patient ID: BRITANEY DENYS MRN: [id number]; DOB: 24-Nov-1934  Admit date: 02/11/2023 Date of Consult: 02/11/2023  PCP:  Bradd Canary, MD   [facility] Providers Cardiologist:  Tonny Bollman, MD  Cardiology APP:  Beatrice Lecher, PA-C      Patient Profile:   Heather [name] is a 88 y.o. female with a hx of CAD s/p PCI to LCX in 03/2005, PCI to RCA and medical therapy for moderate LAD 06/2005, HFpEF, RBBB, DM2, TIA, HLD, GERD, hitatal hernia s/p repair 1989, cholecystectomy 2022, and DVT  who is being seen 02/11/2023 for the evaluation of post operative heart failure management at the request of Dr. Derrell Lolling.  History of Present Illness:   Heather [name] with the above history is followed by Dr. Excell Seltzer for her cardiac care. In 2022 after cholecystectomy she had acute volume overload and diastolic CHF that required aggressive fluid resuscitation. She has had two subsequent hernia repairs with no associated HF exacerbation.  During office visit with Dr. Excell Seltzer 08/02/22 she was deemed acceptable operative risk for hernia repair. A post-op cardiology consult was recommended to help monitor volume status and signs of ischemia.   Today she presented to the pre-op holding area for elective incisional hernia repair. Incisional hernia repair was completed successfully with no complications.   On exam she complains of surgical abdominal pain with no other complaints. Denies shortness breath. Appears euvolemic.    Past Medical History:  Diagnosis Date   Aortic atherosclerosis (HCC) 10/12/2021   Cancer (HCC)    Uterine   Cataract    CHF (congestive heart failure) (HCC)    Chronic stable angina    Coronary artery disease cardiologist--- dr Excell Seltzer   hx NSTEMI  w/ cardiac cath 03-16-2005 PCI with DES to LCx;   cardiac cath 06-27-2005  PCI with DES to RCA with residual dx LAD manage medcially;  lexiscan 01-26-20211 normal no ischemia, ef 70%;  stress echo w/ dobutamine  10-24-2011 negative ishcmeia , normal ef // Myoview 2/22: EF 67, no ischemia or infarction, no TID, low risk    Diabetes mellitus type 2, diet-controlled (HCC)    followed by pcp  (10-19-2020  pt stated checks daily in am,  fasting blood surgar--- 115--120s)   DOE (dyspnea on exertion)    per pt "when I over do",  ok with household chores   Echocardiogram 08/2020    Echocardiogram 2/22: EF 55-60, no RWMA, mild LVH, Gr 2 DD, GLS-21.7%, normal RVSF, trivial MR, RVSP 39.5   Edema of right lower extremity    GERD (gastroesophageal reflux disease)    Hiatal hernia    recurrence,  hx HH repair 1989   History of cervical cancer    s/p  vaginal hysterectomy   History of DVT of lower extremity 2016   11-29-2014 post op right TKA of right lower extremity and completed xarelto    History of esophageal stricture    hx s/p dilatation's   History of gastric ulcer 2005 approx.   History of palpitations 2010   event monitor 07-07-2009 showed NSR w/ freq. SVT ectopies with short runs, rare PVCs   History of TIA (transient ischemic attack) 06/1999   12-15-2019  per pt had several TIA between 12/ 2000 to 02/ 2001 , was sent to specialist @[facility] , had test that was normal (10-19-2020 pt stated no TIAs since ) but has residual of essential tremors of right arm/ hand   Hypertension  Intermittent palpitations    IT band syndrome    Migraine    "ice pick headche lasts about 30 seconds"   Mixed hyperlipidemia    Mixed incontinence urge and stress    Multiple thyroid nodules    followed by pcp---   ultrasound 11-22-2014 no bx   (12-15-2019 per pt had a endocrinologist and was told did not need bx)   OA (osteoarthritis)    knees, elbow, hip, ankles   Occasional tremors    right arm/ hand  s/p TIA residual 2000   Osteoporosis    taking vitamin d   Peroneal DVT (deep venous thrombosis) (HCC) 12/22/2014   Right bundle branch block (RBBB) with left anterior fascicular block (LAFB)    RLS (restless legs  syndrome)    S/P drug eluting coronary stent placement 2006   03-16-2005  PCI x1 DES to LCx;   06-27-2005  PCI x1 DES to RCA   Stroke [facility])    TIA's   Urinary retention    post op sling prodecure on 10-27-2020, has foley cathether    Past Surgical History:  Procedure Laterality Date   ANTERIOR AND POSTERIOR REPAIR N/A 12/22/2019   Procedure: ANTERIOR (CYSTOCELE)  REPAIR;  Surgeon: Sherian Rein, MD;  Location: [facility];  Service: Gynecology;  Laterality: N/A;   ANTERIOR AND POSTERIOR REPAIR WITH SACROSPINOUS FIXATION N/A 10/27/2020   Procedure: SACROSPINOUS LIGAMENT FIXATION;  Surgeon: Marguerita Beards, MD;  Location: [facility];  Service: Gynecology;  Laterality: N/A;   BLADDER SUSPENSION N/A 10/27/2020   Procedure: TRANSVAGINAL TAPE (TVT) PROCEDURE;  Surgeon: Marguerita Beards, MD;  Location: [facility];  Service: Gynecology;  Laterality: N/A;   CATARACT EXTRACTION W/ INTRAOCULAR LENS  IMPLANT, BILATERAL  2015   CHOLECYSTECTOMY N/A 01/25/2021   Procedure: LAPAROSCOPIC CHOLECYSTECTOMY WITH INTRAOPERATIVE CHOLANGIOGRAM AND LYSIS OF ADHESIONS;  Surgeon: Karie Soda, MD;  Location: [facility];  Service: General;  Laterality: N/A;   COLONOSCOPY  last one ?   CORONARY ANGIOPLASTY WITH STENT PLACEMENT  03-16-2005   dr Riley Kill   PCI and DES x1 to LCx   CORONARY ANGIOPLASTY WITH STENT PLACEMENT  06-27-2005  dr Riley Kill   PCI and DES x1 to RCA with residual disease LAD 70-80% to manage medically   CYSTOSCOPY N/A 10/27/2020   Procedure: CYSTOSCOPY;  Surgeon: Marguerita Beards, MD;  Location: [facility];  Service: Gynecology;  Laterality: N/A;   CYSTOSCOPY N/A 11/09/2020   Procedure: CYSTOSCOPY;  Surgeon: Marguerita Beards, MD;  Location: [facility];  Service: Gynecology;  Laterality: N/A;   EYE SURGERY     FOOT SURGERY Left 1990s   left foot stress fracture repair, per pt no hardware    FRACTURE SURGERY     HERNIA REPAIR     HIATAL HERNIA REPAIR  1989   INCISIONAL HERNIA REPAIR N/A 01/25/2021   Procedure: PRIMARY REPAIR OF INCISIONAL HERNIA;  Surgeon: Karie Soda, MD;  Location: [facility];  Service: General;  Laterality: N/A;   INGUINAL HERNIA REPAIR Left 04/17/2021   Procedure: LAPAROSCOPIC LEFT INGUINAL HERNIA REPAIR WITH MESH;  Surgeon: Axel Filler, MD;  Location: [facility];  Service: General;  Laterality: Left;   JOINT REPLACEMENT     KNEE ARTHROSCOPY Bilateral right ?/   left x2 , last one 09-12-2009 [facility]   PUBOVAGINAL SLING N/A 11/09/2020   Procedure: REVISION OF PUBO-VAGINAL SLING;  Surgeon: Marguerita Beards, MD;  Location: [facility];  Service: Gynecology;  Laterality: N/A;   RECTOCELE REPAIR N/A 04/20/2020   Procedure: POSTERIOR REPAIR (RECTOCELE);  Surgeon: Sherian Rein, MD;  Location: [facility];  Service: Gynecology;  Laterality: N/A;   RECTOCELE REPAIR N/A 10/27/2020   Procedure: POSTERIOR REPAIR (RECTOCELE);  Surgeon: Marguerita Beards, MD;  Location: [facility];  Service: Gynecology;  Laterality: N/A;  total time requested for all procedures is 2 hours   SMALL INTESTINE SURGERY     TOTAL KNEE ARTHROPLASTY  11/12/2011   Procedure: TOTAL KNEE ARTHROPLASTY;  Surgeon: Nilda Simmer, MD;  Location: [facility];  Service: Orthopedics;  Laterality: Left;  Dr Thurston Hole wants 90 minutes for this case   TOTAL KNEE ARTHROPLASTY Right 11/29/2014   Procedure: RIGHT TOTAL KNEE ARTHROPLASTY;  Surgeon: Dannielle Huh, MD;  Location: [facility];  Service: Orthopedics;  Laterality: Right;   UPPER GASTROINTESTINAL ENDOSCOPY  last one 04-25-2017   with dilatation esophageal stricture and savary dilatation   VAGINAL HYSTERECTOMY  1988    no ovaries removed for bleeeding     Home Medications:  Prior to Admission medications   Medication Sig Start Date End Date Taking? Authorizing Provider  acetaminophen (TYLENOL) 500 MG  tablet Take 1,000 mg by mouth every 6 (six) hours as needed for moderate pain.   Yes [provider]  amLODipine (NORVASC) 10 MG tablet TAKE 1 TABLET EVERY DAY 11/19/22  Yes Bradd Canary, MD  aspirin EC 81 MG tablet Take 1 tablet (81 mg total) by mouth daily. 05/04/15  Yes Tonny Bollman, MD  cholecalciferol (VITAMIN D3) 25 MCG (1000 UNIT) tablet Take 1,000 Units by mouth daily.   Yes [provider]  estradiol (ESTRACE) 0.1 MG/GM vaginal cream Place 0.5 g vaginally 2 (two) times a week. Place 0.5g nightly for two weeks then twice a week after 09/25/21  Yes Marguerita Beards, MD  Glycerin, PF, (OPTASE COMFORT DRY EYE) 1 % SOLN Place 1 drop into both eyes 4 (four) times daily.   Yes [provider]  isosorbide mononitrate (IMDUR) 60 MG 24 hr tablet TAKE 1 AND 1/2 TABLETS EVERY DAY 07/23/22  Yes Tonny Bollman, MD  losartan (COZAAR) 100 MG tablet TAKE 1 TABLET EVERY DAY Patient taking differently: Take 50 mg by mouth 2 (two) times daily. 01/07/23  Yes Tonny Bollman, MD  Magnesium Oxide 250 MG TABS Take 250 mg by mouth every morning.   Yes [provider]  metoprolol succinate (TOPROL-XL) 25 MG 24 hr tablet Take 0.5 tablets (12.5 mg total) by mouth daily. 02/01/22  Yes Bradd Canary, MD  Olopatadine HCl (PATADAY) 0.2 % SOLN Place 1 drop into both eyes daily.   Yes [provider]  omeprazole (PRILOSEC) 20 MG capsule TAKE 1 CAPSULE EVERY DAY AS NEEDED (NEED OFFICE VISIT BEFORE ANY FURTHER REFILLS) Patient taking differently: Take 20 mg by mouth every morning. 07/11/22  Yes Bradd Canary, MD  pramipexole (MIRAPEX) 0.25 MG tablet TAKE 1 TABLET TWICE DAILY 10/08/22  Yes Bradd Canary, MD  simvastatin (ZOCOR) 20 MG tablet TAKE 1 TABLET AT BEDTIME 02/01/23  Yes Tonny Bollman, MD  Blood Glucose Monitoring Suppl (TRUE METRIX AIR GLUCOSE METER) w/Device KIT USE TO CHECK BLOOD SUGAR ONCE DAILY AND AS NEEDED.  DX CODE E11.9 07/27/20   Bradd Canary, MD   diclofenac Sodium (VOLTAREN) 1 % GEL Apply 1 application topically 4 (four) times daily as needed (hip pain).    [provider]  famotidine (PEPCID) 20 MG  tablet TAKE 1 TABLET AT BEDTIME AS NEEDED FOR HEARTBURN OR INDIGESTION. Patient not taking: Reported on 01/30/2023 12/13/22   Hyman Hopes B, NP  furosemide (LASIX) 20 MG tablet Take 1 tablet (20 mg total) by mouth daily. Patient taking differently: Take 20 mg by mouth daily as needed for edema. 03/29/21   Supple, Megan E, RPH-CPP  glucose blood (TRUE METRIX BLOOD GLUCOSE TEST) test strip TEST BLOOD SUGAR EVERY DAY  OR AS NEEDED 09/21/21   Bradd Canary, MD  TRUEplus Lancets 33G MISC USE TO CHECK BLOOD SUGAR ONCE DAILY AND AS NEEDED.  DX CODE: E11.9 07/27/20   Bradd Canary, MD    Inpatient Medications: Scheduled Meds:  amLODipine  10 mg Oral Daily   cholecalciferol  1,000 Units Oral Daily   famotidine  20 mg Oral QHS   fentaNYL       fentaNYL       Glycerin (PF)  1 drop Both Eyes QID   insulin aspart  0-9 Units Subcutaneous TID WC   isosorbide mononitrate  60 mg Oral Daily   losartan  50 mg Oral BID   [START ON 02/12/2023] Magnesium Oxide  250 mg Oral q morning   metoprolol succinate  12.5 mg Oral Daily   oxyCODONE       pramipexole  0.25 mg Oral BID   simvastatin  20 mg Oral QHS   Continuous Infusions:  sodium chloride     PRN Meds: acetaminophen, fentaNYL, fentaNYL, furosemide, HYDROcodone-acetaminophen, HYDROmorphone (DILAUDID) injection, methocarbamol, ondansetron **OR** ondansetron (ZOFRAN) IV, oxyCODONE  Allergies:    Allergies  Allergen Reactions   Baclofen Other (See Comments)    Hyperactivity    Gabapentin Other (See Comments)    Made her hyper   Nitrofurantoin Rash    Social History:   Social History   Socioeconomic History   Marital status: Widowed    Spouse name: Not on file   Number of children: 3   Years of education: 26   Highest education level: Associate degree: occupational, Scientist, product/process development,  or vocational program  Occupational History   Occupation: works partime in Public affairs consultant: RETIRED    Comment: retired  Tobacco Use   Smoking status: Never   Smokeless tobacco: Never  Vaping Use   Vaping status: Never Used  Substance and Sexual Activity   Alcohol use: No    Alcohol/week: 0.0 standard drinks of alcohol   Drug use: Never   Sexual activity: Not Currently    Birth control/protection: Surgical    Comment: lives alone  Other Topics Concern   Not on file  Social History Narrative   Lives with husband, Caffeine use- Half Caffeine)- 2 cups daily.  3 children living, one passed away.   Education: HS. Business college.  Retired.    Social Determinants of Health   Financial Resource Strain: Low Risk  (12/12/2022)   Overall Financial Resource Strain (CARDIA)    Difficulty of Paying Living Expenses: Not hard at all  Food Insecurity: No Food Insecurity (12/12/2022)   Hunger Vital Sign    Worried About Running Out of Food in the Last Year: Never true    Ran Out of Food in the Last Year: Never true  Transportation Needs: No Transportation Needs (12/12/2022)   PRAPARE - Administrator, Civil Service (Medical): No    Lack of Transportation (Non-Medical): No  Physical Activity: Inactive (12/12/2022)   Exercise Vital Sign    Days of Exercise per Week: 0 days  Minutes of Exercise per Session: 0 min  Stress: Stress Concern Present (12/12/2022)   [employer] of Occupational Health - Occupational Stress Questionnaire    Feeling of Stress : Rather much  Social Connections: Moderately Isolated (12/12/2022)   Social Connection and Isolation Panel [NHANES]    Frequency of Communication with Friends and Family: More than three times a week    Frequency of Social Gatherings with Friends and Family: Once a week    Attends Religious Services: More than 4 times per year    Active Member of [employer] or Organizations: No    Attends Banker Meetings: Never    Marital  Status: Widowed  Intimate Partner Violence: Not At Risk (11/28/2021)   Humiliation, Afraid, Rape, and Kick questionnaire    Fear of Current or Ex-Partner: No    Emotionally Abused: No    Physically Abused: No    Sexually Abused: No    Family History:    Family History  Problem Relation Age of Onset   Stroke Father        family hx of M 1st degree relative <50   Heart disease Father    Coronary artery disease Mother    Heart disease Mother    Arthritis Mother    Hearing loss Mother    Vision loss Mother    Depression Brother    Cancer Brother    Stroke Brother    Diabetes Brother    Cancer Brother        bladder with mets   Diabetes Daughter        borderline   Hypertension Daughter    Arthritis Other        family hx of   Hypertension Other        family hx of   Other Other        family hx of cardiovascular disorder   Thyroid disease Daughter    Early death Daughter    Breast cancer Neg Hx    Colon cancer Neg Hx    Anesthesia problems Neg Hx    Hypotension Neg Hx    Malignant hyperthermia Neg Hx    Pseudochol deficiency Neg Hx    Colon polyps Neg Hx    Esophageal cancer Neg Hx    Rectal cancer Neg Hx    Stomach cancer Neg Hx      ROS:  Please see the history of present illness.  All other ROS reviewed and negative.     Physical Exam/Data:   Vitals:   02/11/23 1450 02/11/23 1500 02/11/23 1600 02/11/23 1615  BP:  (!) 144/62 (!) 140/57 (!) 142/56  Pulse: 68 61 65 64  Resp: 13 10 15 16   Temp:  97.8 F (36.6 C)  (!) 97.5 F (36.4 C)  TempSrc:    Oral  SpO2: 96% 95% 95% 95%  Weight:      Height:        Intake/Output Summary (Last 24 hours) at 02/11/2023 1632 Last data filed at 02/11/2023 1357 Gross per 24 hour  Intake 600 ml  Output 100 ml  Net 500 ml      02/11/2023   10:43 AM 02/05/2023   12:54 PM 12/13/2022   10:59 AM  Last 3 Weights  Weight (lbs) 147 lb 9.6 oz 152 lb 1.6 oz 153 lb  Weight (kg) 66.951 kg 68.992 kg 69.4 kg     Body mass index is  27 kg/m.  General:  Well nourished, well developed, in  no acute distress HEENT: normal Neck: no JVD Cardiac:  normal S1, S2; RRR; no murmur  Lungs:  clear to auscultation bilaterally, no wheezing, rhonchi or rales  Abd: painful Ext: no edema  Neuro:  CNs 2-12 intact, no focal abnormalities noted Psych:  Normal affect   EKG:  The EKG was personally reviewed and demonstrates:  No new tracing  Relevant CV Studies: Echo 01/29/21 IMPRESSIONS    1. Left ventricular ejection fraction, by estimation, is 60 to 65%. The  left ventricle has normal function. The left ventricle has no regional  wall motion abnormalities. Left ventricular diastolic parameters are  indeterminate.   2. Right ventricular systolic function is normal. The right ventricular  size is normal. There is normal pulmonary artery systolic pressure. The  estimated right ventricular systolic pressure is 31.1 mmHg.   3. The mitral valve is normal in structure. Trivial mitral valve  regurgitation. No evidence of mitral stenosis.   4. The aortic valve is tricuspid. Aortic valve regurgitation is not  visualized. Mild to moderate aortic valve sclerosis/calcification is  present, without any evidence of aortic stenosis.   5. The inferior vena cava is normal in size with greater than 50%  respiratory variability, suggesting right atrial pressure of 3 mmHg.   Laboratory Data:  Chemistry Recent Labs  Lab 02/05/23 1400  NA 138  K 4.1  CL 102  CO2 25  GLUCOSE 120*  BUN 15  CREATININE 0.90  CALCIUM 9.6  GFRNONAA >60  ANIONGAP 11    Hematology Recent Labs  Lab 02/05/23 1400  WBC 7.7  RBC 4.51  HGB 14.1  HCT 42.6  MCV 94.5  MCH 31.3  MCHC 33.1  RDW 12.3  PLT 170   Radiology/Studies:  No results found.   Assessment and Plan:   HFpEF -- asymptomatic at last office visit 07/2022 -- continue losartan 50mg  daily, toprol XL 12.5mg  daily -- euvolemic on exam  CAD -- s/p multiple coronary interventions, has  been asymptomatic.  -- continue toprol, simvastain 20mg  daily -- restart aspirin 81mg  daily per surgery team  HLD -- continue simvastatin as above  Per Primary -- s/p incisional hernia repair    Risk Assessment/Risk Scores:   New York Heart Association (NYHA) Functional Class NYHA Class I   For questions or updates, please contact [facility] Please consult [email or web address] for contact info under   Signed, Osborne Oman, RN, Student Nurse Practitioner 02/11/2023 4:32 PM

## 2023-02-11 NOTE — Anesthesia Postprocedure Evaluation (Signed)
Anesthesia Post Note  Patient: Heather [name]  Procedure(s) Performed: LAPAROSCOPIC ATTEMPTED OPEN VENTRAL HERNIA REPAIR WITH MESH LYSIS OF ADHESION     Patient location during evaluation: PACU Anesthesia Type: General Level of consciousness: awake and alert Pain management: pain level controlled Vital Signs Assessment: post-procedure vital signs reviewed and stable Respiratory status: spontaneous breathing, nonlabored ventilation, respiratory function stable and patient connected to nasal cannula oxygen Cardiovascular status: blood pressure returned to baseline and stable Postop Assessment: no apparent nausea or vomiting Anesthetic complications: no  No notable events documented.  Last Vitals:  Vitals:   02/11/23 1043 02/11/23 1400  BP: (!) 155/67 (!) 137/94  Pulse: 65 72  Resp: 18 15  Temp: 36.8 C 36.5 C  SpO2: 95% 96%    Last Pain:  Vitals:   02/11/23 1400  TempSrc:   PainSc: 0-No pain                 ,

## 2023-02-11 NOTE — Progress Notes (Signed)
Received patient from PACU s/p hernia repair. Patient alert and oriented x4, vitals measured, 2 L [place] acute in use post op. Skin assessed, midline incision and 3 trocar sites approximated by skin glue. Abdominal binder in use. Orders reviewed, plan of care discussed. Patient is DTV at this time. Family at bedside.

## 2023-02-11 NOTE — Transfer of Care (Signed)
Immediate Anesthesia Transfer of Care Note  Patient: Heather [name]  Procedure(s) Performed: LAPAROSCOPIC ATTEMPTED OPEN VENTRAL HERNIA REPAIR WITH MESH LYSIS OF ADHESION  Patient Location: PACU  Anesthesia Type:General  Level of Consciousness: awake, drowsy, and patient cooperative  Airway & Oxygen Therapy: Patient Spontanous Breathing  Post-op Assessment: Report given to RN, Post -op Vital signs reviewed and stable, and Patient moving all extremities X 4  Post vital signs: Reviewed and stable  Last Vitals:  Vitals Value Taken Time  BP 137/94 02/11/23 1400  Temp    Pulse 70 02/11/23 1403  Resp 17 02/11/23 1403  SpO2 96 % 02/11/23 1403  Vitals shown include unfiled device data.  Last Pain:  Vitals:   02/11/23 1136  TempSrc:   PainSc: 0-No pain         Complications: No notable events documented.

## 2023-02-12 ENCOUNTER — Encounter (HOSPITAL_COMMUNITY): Payer: Self-pay | Admitting: General Surgery

## 2023-02-12 DIAGNOSIS — Z9889 Other specified postprocedural states: Secondary | ICD-10-CM | POA: Diagnosis not present

## 2023-02-12 DIAGNOSIS — I5032 Chronic diastolic (congestive) heart failure: Secondary | ICD-10-CM | POA: Diagnosis not present

## 2023-02-12 DIAGNOSIS — Z8719 Personal history of other diseases of the digestive system: Secondary | ICD-10-CM | POA: Diagnosis not present

## 2023-02-12 LAB — GLUCOSE, CAPILLARY
Glucose-Capillary: 113 mg/dL — ABNORMAL HIGH (ref 70–99)
Glucose-Capillary: 131 mg/dL — ABNORMAL HIGH (ref 70–99)
Glucose-Capillary: 138 mg/dL — ABNORMAL HIGH (ref 70–99)
Glucose-Capillary: 157 mg/dL — ABNORMAL HIGH (ref 70–99)
Glucose-Capillary: 171 mg/dL — ABNORMAL HIGH (ref 70–99)

## 2023-02-12 MED ORDER — ALUM & MAG HYDROXIDE-SIMETH 200-200-20 MG/5ML PO SUSP
30.0000 mL | Freq: Four times a day (QID) | ORAL | Status: DC | PRN
  Administered 2023-02-12: 30 mL via ORAL

## 2023-02-12 NOTE — Progress Notes (Signed)
Rounding Note    Patient Name: Heather [name] Date of Encounter: 02/12/2023  [facility] Cardiologist: Tonny Bollman, MD   Subjective   Didn't sleep well overnight, felt like abdominal binder was squeezing her lungs. Feels better now that she is sitting up in a chair and changed to a smaller binder.   Inpatient Medications    Scheduled Meds:  amLODipine  10 mg Oral Daily   cholecalciferol  1,000 Units Oral Daily   famotidine  20 mg Oral QHS   insulin aspart  0-9 Units Subcutaneous TID WC   isosorbide mononitrate  60 mg Oral Daily   losartan  50 mg Oral BID   magnesium oxide  200 mg Oral q morning   metoprolol succinate  12.5 mg Oral Daily   polyvinyl alcohol  1 drop Both Eyes QID   pramipexole  0.25 mg Oral BID   simvastatin  20 mg Oral QHS   Continuous Infusions:  sodium chloride Stopped (02/11/23 1926)   PRN Meds: acetaminophen, furosemide, HYDROcodone-acetaminophen, HYDROmorphone (DILAUDID) injection, methocarbamol, ondansetron **OR** ondansetron (ZOFRAN) IV   Vital Signs    Vitals:   02/11/23 2024 02/12/23 0015 02/12/23 0407 02/12/23 0803  BP: (!) 140/58 (!) 130/59 (!) 148/46 (!) 147/51  Pulse: 62 61 67 68  Resp: 18 16 20 17   Temp: 98.1 F (36.7 C) 98 F (36.7 C) 97.9 F (36.6 C) 98.2 F (36.8 C)  TempSrc: Oral Oral Oral Oral  SpO2: 95% 91% 95% 93%  Weight:      Height:        Intake/Output Summary (Last 24 hours) at 02/12/2023 0920 Last data filed at 02/11/2023 1804 Gross per 24 hour  Intake 675.18 ml  Output 100 ml  Net 575.18 ml      02/11/2023   10:43 AM 02/05/2023   12:54 PM 12/13/2022   10:59 AM  Last 3 Weights  Weight (lbs) 147 lb 9.6 oz 152 lb 1.6 oz 153 lb  Weight (kg) 66.951 kg 68.992 kg 69.4 kg      Telemetry    Not on telemetry - Personally Reviewed  ECG    none - Personally Reviewed  Physical Exam   GEN: No acute distress.   Neck: No JVD Cardiac: RRR, no murmurs, rubs, or gallops.  Respiratory: Clear to  auscultation bilaterally. GI: abdominal binder in place MS: No edema; No deformity. Neuro:  Nonfocal  Psych: Normal affect   Labs    High Sensitivity Troponin:  No results for input(s): "TROPONINIHS" in the last 720 hours.   Chemistry Recent Labs  Lab 02/05/23 1400  NA 138  K 4.1  CL 102  CO2 25  GLUCOSE 120*  BUN 15  CREATININE 0.90  CALCIUM 9.6  GFRNONAA >60  ANIONGAP 11    Lipids No results for input(s): "CHOL", "TRIG", "HDL", "LABVLDL", "LDLCALC", "CHOLHDL" in the last 168 hours.  Hematology Recent Labs  Lab 02/05/23 1400  WBC 7.7  RBC 4.51  HGB 14.1  HCT 42.6  MCV 94.5  MCH 31.3  MCHC 33.1  RDW 12.3  PLT 170   Thyroid No results for input(s): "TSH", "FREET4" in the last 168 hours.  BNPNo results for input(s): "BNP", "PROBNP" in the last 168 hours.  DDimer No results for input(s): "DDIMER" in the last 168 hours.   Radiology    No results found.  Cardiac Studies   None this admission  Patient Profile     88 y.o. female with a hx of CAD  s/p PCI to LCX in 03/2005, PCI to RCA and medical therapy for moderate LAD 06/2005, HFpEF, RBBB, DM2, TIA, HLD, GERD, hitatal hernia s/p repair 1989, cholecystectomy 2022, and DVT  who is being seen for the evaluation of post operative heart failure management at the request of Dr. Derrell Lolling   Assessment & Plan    Chronic diastolic heart failure -appears euvolemic today -restart PRN lasix on discharge -discussed with daughter that ambulation as tolerated will be helpful to prevent stasis/fluid retention  Hypertension -continue amlodipine, isosorbide, losartan, metoprolol  CAD Hyperlipidemia -restart aspirin when clear from surgical perspective -continue simvastatin  [facility] will sign off.   Medication Recommendations:  no change to prior outpatient meds Other recommendations (labs, testing, etc):  none Follow up as an outpatient:  keep previously scheduled follow up interval  For questions or  updates, please contact [facility] Please consult [email or web address] for contact info under        Signed, Jodelle Red, MD  02/12/2023, 9:20 AM

## 2023-02-12 NOTE — Plan of Care (Signed)
  Problem: Coping: Goal: Ability to adjust to condition or change in health will improve Outcome: Progressing   Problem: Fluid Volume: Goal: Ability to maintain a balanced intake and output will improve Outcome: Progressing   Problem: Health Behavior/Discharge Planning: Goal: Ability to manage health-related needs will improve Outcome: Progressing   Problem: Metabolic: Goal: Ability to maintain appropriate glucose levels will improve Outcome: Progressing   Problem: Skin Integrity: Goal: Risk for impaired skin integrity will decrease Outcome: Progressing

## 2023-02-12 NOTE — Progress Notes (Signed)
1 Day Post-Op   Subjective/Chief Complaint: PT doing wellt his AM Has some soreness and left sided bruising   Objective: Vital signs in last 24 hours: Temp:  [97.5 F (36.4 C)-98.2 F (36.8 C)] 98.2 F (36.8 C) (08/06 0803) Pulse Rate:  [61-74] 68 (08/06 0803) Resp:  [10-20] 17 (08/06 0803) BP: (130-170)/(46-94) 147/51 (08/06 0803) SpO2:  [91 %-99 %] 93 % (08/06 0803) Weight:  [67 kg] 67 kg (08/05 1043) Last BM Date : 02/10/23  Intake/Output from previous day: 08/05 0701 - 08/06 0700 In: 675.2 [I.V.:575.2; IV Piggyback:100] Out: 100 [Urine:100] Intake/Output this shift: No intake/output data recorded.  General appearance: alert and cooperative GI: soft, non-tender; bowel sounds normal; no masses,  no organomegaly and inc cdi, left sided bruising  Lab Results:  No results for input(s): "WBC", "HGB", "HCT", "PLT" in the last 72 hours. BMET No results for input(s): "NA", "K", "CL", "CO2", "GLUCOSE", "BUN", "CREATININE", "CALCIUM" in the last 72 hours. PT/INR No results for input(s): "LABPROT", "INR" in the last 72 hours. ABG No results for input(s): "PHART", "HCO3" in the last 72 hours.  Invalid input(s): "PCO2", "PO2"  Studies/Results: No results found.  Anti-infectives: Anti-infectives (From admission, onward)    Start     Dose/Rate Route Frequency Ordered Stop   02/11/23 1130  ceFAZolin (ANCEF) IVPB 2g/100 mL premix        2 g 200 mL/hr over 30 Minutes Intravenous  Once 02/11/23 1128 02/11/23 1224   02/11/23 1128  ceFAZolin (ANCEF) 2-4 GM/100ML-% IVPB       Note to Pharmacy: Hurshel Keys C: cabinet override      02/11/23 1128 02/11/23 1229       Assessment/Plan: s/p Procedure(s) with comments: LAPAROSCOPIC ATTEMPTED OPEN VENTRAL HERNIA REPAIR WITH MESH (N/A) - 2.5 HRS LYSIS OF ADHESION (N/A) Advance diet as tol Mobilize Home likely tomorrow Appreciate Cards input  LOS: 1 day    Axel Filler 02/12/2023

## 2023-02-12 NOTE — Progress Notes (Signed)
Mobility Specialist Progress Note   02/12/23 1115  Mobility  Activity Ambulated with assistance in hallway  Level of Assistance Modified independent, requires aide device or extra time  Assistive Device Front wheel walker  Distance Ambulated (ft) 150 ft  Range of Motion/Exercises Active;All extremities  Activity Response Tolerated well   Patient received in recliner chair and agreeable to participate. Ambulated supervision to mod I with slow steady gait. Reported mild instability secondary to pain medications and muscle relaxer's. No LOB observed during session.  Returned to room without complaint or incident. Was left in recliner with all needs met, call bell in reach.   [place] , BS EXP Mobility Specialist Please contact via SecureChat or Rehab office at [phone]

## 2023-02-13 ENCOUNTER — Encounter (HOSPITAL_COMMUNITY): Payer: Self-pay | Admitting: Emergency Medicine

## 2023-02-13 ENCOUNTER — Observation Stay (HOSPITAL_COMMUNITY)
Admission: EM | Admit: 2023-02-13 | Discharge: 2023-02-14 | Disposition: A | Discharge status: Home / Self Care | Payer: Medicare HMO | Attending: General Surgery | Admitting: General Surgery

## 2023-02-13 ENCOUNTER — Emergency Department (HOSPITAL_COMMUNITY): Payer: Medicare HMO

## 2023-02-13 ENCOUNTER — Other Ambulatory Visit: Payer: Self-pay

## 2023-02-13 DIAGNOSIS — Z8673 Personal history of transient ischemic attack (TIA), and cerebral infarction without residual deficits: Secondary | ICD-10-CM | POA: Insufficient documentation

## 2023-02-13 DIAGNOSIS — R059 Cough, unspecified: Secondary | ICD-10-CM | POA: Diagnosis not present

## 2023-02-13 DIAGNOSIS — R509 Fever, unspecified: Secondary | ICD-10-CM | POA: Diagnosis not present

## 2023-02-13 DIAGNOSIS — R109 Unspecified abdominal pain: Secondary | ICD-10-CM

## 2023-02-13 DIAGNOSIS — R9389 Abnormal findings on diagnostic imaging of other specified body structures: Secondary | ICD-10-CM | POA: Diagnosis not present

## 2023-02-13 DIAGNOSIS — J9811 Atelectasis: Principal | ICD-10-CM | POA: Insufficient documentation

## 2023-02-13 DIAGNOSIS — R051 Acute cough: Secondary | ICD-10-CM

## 2023-02-13 DIAGNOSIS — I700 Atherosclerosis of aorta: Secondary | ICD-10-CM | POA: Diagnosis not present

## 2023-02-13 DIAGNOSIS — Z955 Presence of coronary angioplasty implant and graft: Secondary | ICD-10-CM | POA: Insufficient documentation

## 2023-02-13 DIAGNOSIS — Z1152 Encounter for screening for COVID-19: Secondary | ICD-10-CM | POA: Insufficient documentation

## 2023-02-13 DIAGNOSIS — R0602 Shortness of breath: Secondary | ICD-10-CM | POA: Diagnosis not present

## 2023-02-13 DIAGNOSIS — I2510 Atherosclerotic heart disease of native coronary artery without angina pectoris: Secondary | ICD-10-CM | POA: Insufficient documentation

## 2023-02-13 DIAGNOSIS — I110 Hypertensive heart disease with heart failure: Secondary | ICD-10-CM | POA: Diagnosis not present

## 2023-02-13 DIAGNOSIS — I509 Heart failure, unspecified: Secondary | ICD-10-CM | POA: Diagnosis not present

## 2023-02-13 DIAGNOSIS — Z86718 Personal history of other venous thrombosis and embolism: Secondary | ICD-10-CM | POA: Diagnosis not present

## 2023-02-13 DIAGNOSIS — R0600 Dyspnea, unspecified: Secondary | ICD-10-CM | POA: Diagnosis not present

## 2023-02-13 DIAGNOSIS — R0781 Pleurodynia: Secondary | ICD-10-CM | POA: Diagnosis not present

## 2023-02-13 DIAGNOSIS — E119 Type 2 diabetes mellitus without complications: Secondary | ICD-10-CM | POA: Insufficient documentation

## 2023-02-13 DIAGNOSIS — Z8541 Personal history of malignant neoplasm of cervix uteri: Secondary | ICD-10-CM | POA: Insufficient documentation

## 2023-02-13 DIAGNOSIS — Z96653 Presence of artificial knee joint, bilateral: Secondary | ICD-10-CM | POA: Insufficient documentation

## 2023-02-13 DIAGNOSIS — Z8719 Personal history of other diseases of the digestive system: Secondary | ICD-10-CM

## 2023-02-13 LAB — URINALYSIS, ROUTINE W REFLEX MICROSCOPIC
Bilirubin Urine: NEGATIVE
Glucose, UA: NEGATIVE mg/dL
Hgb urine dipstick: NEGATIVE
Ketones, ur: 5 mg/dL — AB
Leukocytes,Ua: NEGATIVE
Nitrite: NEGATIVE
Protein, ur: NEGATIVE mg/dL
Specific Gravity, Urine: 1.01 (ref 1.005–1.030)
pH: 5 (ref 5.0–8.0)

## 2023-02-13 LAB — CBC WITH DIFFERENTIAL/PLATELET
Abs Immature Granulocytes: 0.03 10*3/uL (ref 0.00–0.07)
Basophils Absolute: 0 10*3/uL (ref 0.0–0.1)
Basophils Relative: 1 %
Eosinophils Absolute: 0.1 10*3/uL (ref 0.0–0.5)
Eosinophils Relative: 1 %
HCT: 36.7 % (ref 36.0–46.0)
Hemoglobin: 12 g/dL (ref 12.0–15.0)
Immature Granulocytes: 0 %
Lymphocytes Relative: 16 %
Lymphs Abs: 1.4 10*3/uL (ref 0.7–4.0)
MCH: 30.5 pg (ref 26.0–34.0)
MCHC: 32.7 g/dL (ref 30.0–36.0)
MCV: 93.1 fL (ref 80.0–100.0)
Monocytes Absolute: 1 10*3/uL (ref 0.1–1.0)
Monocytes Relative: 12 %
Neutro Abs: 6.1 10*3/uL (ref 1.7–7.7)
Neutrophils Relative %: 70 %
Platelets: 149 10*3/uL — ABNORMAL LOW (ref 150–400)
RBC: 3.94 MIL/uL (ref 3.87–5.11)
RDW: 12.6 % (ref 11.5–15.5)
WBC: 8.6 10*3/uL (ref 4.0–10.5)
nRBC: 0 % (ref 0.0–0.2)

## 2023-02-13 LAB — TROPONIN I (HIGH SENSITIVITY)
Troponin I (High Sensitivity): 10 ng/L (ref <18)
Troponin I (High Sensitivity): 10 ng/L (ref <18)

## 2023-02-13 LAB — COMPREHENSIVE METABOLIC PANEL
ALT: 18 U/L (ref 0–44)
AST: 22 U/L (ref 15–41)
Albumin: 3.7 g/dL (ref 3.5–5.0)
Alkaline Phosphatase: 71 U/L (ref 38–126)
Anion gap: 12 (ref 5–15)
BUN: 15 mg/dL (ref 8–23)
CO2: 24 mmol/L (ref 22–32)
Calcium: 8.8 mg/dL — ABNORMAL LOW (ref 8.9–10.3)
Chloride: 96 mmol/L — ABNORMAL LOW (ref 98–111)
Creatinine, Ser: 0.89 mg/dL (ref 0.44–1.00)
GFR, Estimated: >60 mL/min (ref >60)
Glucose, Bld: 127 mg/dL — ABNORMAL HIGH (ref 70–99)
Potassium: 4 mmol/L (ref 3.5–5.1)
Sodium: 132 mmol/L — ABNORMAL LOW (ref 135–145)
Total Bilirubin: 1.1 mg/dL (ref 0.3–1.2)
Total Protein: 6.1 g/dL — ABNORMAL LOW (ref 6.5–8.1)

## 2023-02-13 LAB — I-STAT CG4 LACTIC ACID, ED
Lactic Acid, Venous: 0.7 mmol/L (ref 0.5–1.9)
Lactic Acid, Venous: 0.6 mmol/L (ref 0.5–1.9)
Lactic Acid, Venous: 0.7 mmol/L (ref 0.5–1.9)

## 2023-02-13 LAB — RESP PANEL BY RT-PCR (RSV, FLU A&B, COVID)  RVPGX2
Influenza A by PCR: NEGATIVE
Influenza B by PCR: NEGATIVE
Resp Syncytial Virus by PCR: NEGATIVE
SARS Coronavirus 2 by RT PCR: NEGATIVE

## 2023-02-13 LAB — GLUCOSE, CAPILLARY: Glucose-Capillary: 105 mg/dL — ABNORMAL HIGH (ref 70–99)

## 2023-02-13 LAB — LIPASE, BLOOD: Lipase: 30 U/L (ref 11–51)

## 2023-02-13 LAB — BRAIN NATRIURETIC PEPTIDE: B Natriuretic Peptide: 75.7 pg/mL (ref 0.0–100.0)

## 2023-02-13 MED ORDER — CYCLOBENZAPRINE HCL 5 MG PO TABS: 5.0000 mg | ORAL_TABLET | Freq: Three times a day (TID) | ORAL | 0 refills | Status: DC | PRN

## 2023-02-13 MED ORDER — HYDROCODONE-ACETAMINOPHEN 5-325 MG PO TABS: 1.0000 | ORAL_TABLET | ORAL | 0 refills | Status: DC | PRN

## 2023-02-13 NOTE — ED Provider Notes (Signed)
[facility] AT [facility] Provider Note   CSN: [id number] Arrival date & time: 02/13/23  1851     History {Add pertinent medical, surgical, social history, OB history to HPI:1} Chief Complaint  Patient presents with   Post Op Fever    Heather [name] is a 88 y.o. female.  The history is provided by the patient. No language interpreter was used.  Fever Max temp prior to arrival:  101.1 Temp source:  Oral Severity:  Moderate Onset quality:  Gradual Duration:  1 day Timing:  Constant Progression:  Waxing and waning Chronicity:  New Relieved by:  Nothing Worsened by:  Nothing Ineffective treatments:  None tried Associated symptoms: chest pain, chills and cough   Associated symptoms: no confusion, no congestion, no diarrhea, no dysuria, no headaches, no myalgias, no nausea and no vomiting        Home Medications Prior to Admission medications   Medication Sig Start Date End Date Taking? Authorizing Provider  acetaminophen (TYLENOL) 500 MG tablet Take 1,000 mg by mouth every 6 (six) hours as needed for moderate pain.    [provider]  amLODipine (NORVASC) 10 MG tablet TAKE 1 TABLET EVERY DAY 11/19/22   Bradd Canary, MD  aspirin EC 81 MG tablet Take 1 tablet (81 mg total) by mouth daily. 05/04/15   Tonny Bollman, MD  Blood Glucose Monitoring Suppl (TRUE METRIX AIR GLUCOSE METER) w/Device KIT USE TO CHECK BLOOD SUGAR ONCE DAILY AND AS NEEDED.  DX CODE E11.9 07/27/20   Bradd Canary, MD  cholecalciferol (VITAMIN D3) 25 MCG (1000 UNIT) tablet Take 1,000 Units by mouth daily.    [provider]  cyclobenzaprine (FLEXERIL) 5 MG tablet Take 1 tablet (5 mg total) by mouth 3 (three) times daily as needed for muscle spasms. 02/13/23   Axel Filler, MD  diclofenac Sodium (VOLTAREN) 1 % GEL Apply 1 application topically 4 (four) times daily as needed (hip pain).    [provider]  estradiol (ESTRACE) 0.1 MG/GM vaginal cream  Place 0.5 g vaginally 2 (two) times a week. Place 0.5g nightly for two weeks then twice a week after 09/25/21   Marguerita Beards, MD  famotidine (PEPCID) 20 MG tablet TAKE 1 TABLET AT BEDTIME AS NEEDED FOR HEARTBURN OR INDIGESTION. Patient not taking: Reported on 01/30/2023 12/13/22   Hyman Hopes B, NP  furosemide (LASIX) 20 MG tablet Take 1 tablet (20 mg total) by mouth daily. Patient taking differently: Take 20 mg by mouth daily as needed for edema. 03/29/21   Supple, Megan E, RPH-CPP  glucose blood (TRUE METRIX BLOOD GLUCOSE TEST) test strip TEST BLOOD SUGAR EVERY DAY  OR AS NEEDED 09/21/21   Bradd Canary, MD  Glycerin, PF, (OPTASE COMFORT DRY EYE) 1 % SOLN Place 1 drop into both eyes 4 (four) times daily.    [provider]  HYDROcodone-acetaminophen (NORCO/VICODIN) 5-325 MG tablet Take 1-2 tablets by mouth every 4 (four) hours as needed for moderate pain. 02/13/23   Axel Filler, MD  isosorbide mononitrate (IMDUR) 60 MG 24 hr tablet TAKE 1 AND 1/2 TABLETS EVERY DAY 07/23/22   Tonny Bollman, MD  losartan (COZAAR) 100 MG tablet TAKE 1 TABLET EVERY DAY Patient taking differently: Take 50 mg by mouth 2 (two) times daily. 01/07/23   Tonny Bollman, MD  Magnesium Oxide 250 MG TABS Take 250 mg by mouth every morning.    [provider]  metoprolol succinate (TOPROL-XL) 25 MG 24 hr  tablet Take 0.5 tablets (12.5 mg total) by mouth daily. 02/01/22   Bradd Canary, MD  Olopatadine HCl (PATADAY) 0.2 % SOLN Place 1 drop into both eyes daily.    [provider]  omeprazole (PRILOSEC) 20 MG capsule TAKE 1 CAPSULE EVERY DAY AS NEEDED (NEED OFFICE VISIT BEFORE ANY FURTHER REFILLS) Patient taking differently: Take 20 mg by mouth every morning. 07/11/22   Bradd Canary, MD  pramipexole (MIRAPEX) 0.25 MG tablet TAKE 1 TABLET TWICE DAILY 10/08/22   Bradd Canary, MD  simvastatin (ZOCOR) 20 MG tablet TAKE 1 TABLET AT BEDTIME 02/01/23   Tonny Bollman, MD  TRUEplus Lancets 33G MISC  USE TO CHECK BLOOD SUGAR ONCE DAILY AND AS NEEDED.  DX CODE: E11.9 07/27/20   Bradd Canary, MD      Allergies    Baclofen, Gabapentin, and Nitrofurantoin    Review of Systems   Review of Systems  Constitutional:  Positive for chills, fatigue and fever.  HENT:  Negative for congestion.   Eyes:  Negative for visual disturbance.  Respiratory:  Positive for cough, chest tightness and shortness of breath.   Cardiovascular:  Positive for chest pain. Negative for palpitations and leg swelling.  Gastrointestinal:  Positive for abdominal distention, abdominal pain and constipation. Negative for diarrhea, nausea and vomiting.  Genitourinary:  Negative for dysuria, flank pain and frequency.  Musculoskeletal:  Negative for back pain, myalgias, neck pain and neck stiffness.  Skin:  Positive for wound.  Neurological:  Negative for numbness and headaches.  Psychiatric/Behavioral:  Negative for agitation and confusion.   All other systems reviewed and are negative.   Physical Exam Updated Vital Signs BP (!) 145/71   Pulse 93   Temp 99.3 F (37.4 C) (Oral)   Resp 16   LMP  (LMP Unknown)   SpO2 93%  Physical Exam Vitals and nursing note reviewed.  Constitutional:      General: She is not in acute distress.    Appearance: She is well-developed. She is not ill-appearing, toxic-appearing or diaphoretic.  HENT:     Head: Normocephalic and atraumatic.     Nose: No congestion.     Mouth/Throat:     Mouth: Mucous membranes are moist.     Pharynx: No oropharyngeal exudate or posterior oropharyngeal erythema.  Eyes:     Extraocular Movements: Extraocular movements intact.     Conjunctiva/sclera: Conjunctivae normal.     Pupils: Pupils are equal, round, and reactive to light.  Cardiovascular:     Rate and Rhythm: Normal rate and regular rhythm.     Heart sounds: No murmur heard. Pulmonary:     Effort: Pulmonary effort is normal. No respiratory distress.     Breath sounds: Rhonchi present. No  wheezing or rales.  Chest:     Chest wall: No tenderness.  Abdominal:     General: There is distension.     Palpations: Abdomen is soft.     Tenderness: There is abdominal tenderness. There is no right CVA tenderness, left CVA tenderness, guarding or rebound.  Musculoskeletal:        General: No swelling or tenderness.     Cervical back: Neck supple. No tenderness.  Skin:    General: Skin is warm and dry.     Capillary Refill: Capillary refill takes less than 2 seconds.     Findings: Bruising present. No rash.  Neurological:     General: No focal deficit present.     Mental Status: She is  alert.  Psychiatric:        Mood and Affect: Mood normal.      ED Results / Procedures / Treatments   Labs (all labs ordered are listed, but only abnormal results are displayed) Labs Reviewed  COMPREHENSIVE METABOLIC PANEL - Abnormal; Notable for the following components:      Result Value   Sodium 132 (*)    Chloride 96 (*)    Glucose, Bld 127 (*)    Calcium 8.8 (*)    Total Protein 6.1 (*)    All other components within normal limits  CBC WITH DIFFERENTIAL/PLATELET - Abnormal; Notable for the following components:   Platelets 149 (*)    All other components within normal limits  CULTURE, BLOOD (ROUTINE X 2)  CULTURE, BLOOD (ROUTINE X 2)  URINE CULTURE  RESP PANEL BY RT-PCR (RSV, FLU A&B, COVID)  [id number]  URINALYSIS, ROUTINE W REFLEX MICROSCOPIC  LIPASE, BLOOD  BRAIN NATRIURETIC PEPTIDE  I-STAT CG4 LACTIC ACID, ED  I-STAT CG4 LACTIC ACID, ED  TROPONIN I (HIGH SENSITIVITY)    EKG None  Radiology DG Chest 2 View  Result Date: 02/13/2023 CLINICAL DATA:  Fever. EXAM: CHEST - 2 VIEW COMPARISON:  September 11, 2021. FINDINGS: The heart size and mediastinal contours are within normal limits. Elevated left hemidiaphragm is noted. Minimal bibasilar subsegmental atelectasis is noted. The visualized skeletal structures are unremarkable. IMPRESSION: Minimal bibasilar subsegmental atelectasis.  Aortic Atherosclerosis (ICD10-I70.0). Electronically Signed   By: Lupita Raider M.D.   On: 02/13/2023 20:24    Procedures Procedures  {Document cardiac monitor, telemetry assessment procedure when appropriate:1}  Medications Ordered in ED Medications - No data to display  ED Course/ Medical Decision Making/ A&P   {   Click here for ABCD2, HEART and other calculatorsREFRESH Note before signing :1}                              Medical Decision Making Amount and/or Complexity of Data Reviewed Labs: ordered. Radiology: ordered.    Heather [name] is a 88 y.o. 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  According to patient, she had surgery 2 days ago with general surgery for ventral hernia and since then she says she has not had a bowel movement.  She was reportedly discharged from the hospital today and said that she has not yet had a bowel movement.  She reports she is developing new bruising on her abdomen and is having some abdominal pain.  She says that she feels she cannot take a deep breath although she feels that she needs to cough.  She does report pleuritic discomfort and shortness of breath and feels like she cannot take a deep breath.  She reports fever up to 101.1 at home.  She denies any nausea or vomiting and denies any urinary changes.  No other rashes reported.  No headache or neck pain reported.  Patient called her surgical team who told her to come the emergency department evaluation given the fever and symptoms.  On my exam, lungs did have some coarseness and chest was nontender.  No murmur.  Abdomen was diffusely tender.  See clinical photo with some of the bruising however  the  wounds did not have significant erythema or purulence or drainage.  No dehiscence seen initially.  Abdomen was however tender.  Legs not tender and no critical edema seen.  Oxygen saturations were in the low 90s and patient is not tachycardic or tachypneic initially.  She is warm to the touch.  Given patient's recent surgery, worsened abdominal pain, no bowel movement, no flatus, will get CT abdomen pelvis.  With her history of DVT, new pleuritic chest pain and shortness of breath and inability to take a deep breath just after surgery not on anticoagulation, I am concerned about pulm embolism.  Will get CT PE study.  Will get other screening labs and workup and anticipate reassessment after.  Anticipate discussion with her surgical team if there is intra-abdominal pathology but I do feel patient will likely need admission given her appearance on arrival.     {Document critical care time when appropriate:1} {Document review of labs and clinical decision tools ie heart score, Chads2Vasc2 etc:1}  {Document your independent review of radiology images, and any outside records:1} {Document your discussion with family members, caretakers, and with consultants:1} {Document social determinants of health affecting pt's care:1} {Document your decision making why or why not admission, treatments were needed:1} Final Clinical Impression(s) / ED Diagnoses Final diagnoses:  None    Rx / DC Orders ED Discharge Orders     None

## 2023-02-13 NOTE — Discharge Summary (Signed)
Physician Discharge Summary  Patient ID: Heather [name] MRN: [id number] DOB/AGE: Dec 05, 1934 88 y.o.  Admit date: 02/11/2023 Discharge date: 02/13/2023  Admission Diagnoses: Status post hernia repair  Discharge Diagnoses:  Principal Problem:   S/P hernia surgery Active Problems:   Chronic diastolic heart failure (HCC)   S/P hernia repair   Discharged Condition: good  Hospital Course: Patient underwent laparoscopic assisted incisional hernia pair with mesh.  Please see op note for details. Postop patient doing well.  Cardiology was consulted to help manage fluid status.  Patient had fair pain control.  She was tolerating p.o. well.  She had good bowel function as well as urine output.  She is ambulating well on her own.  She was otherwise deemed stable for discharge and discharged home.  Consults: cardiology  Significant Diagnostic Studies: None  Treatments: surgery: As above  Discharge Exam: Blood pressure (!) 170/60, pulse 74, temperature 98.1 F (36.7 C), temperature source Oral, resp. rate 18, height 5\' 2"  (1.575 m), weight 67 kg, SpO2 95%. General appearance: alert and cooperative GI: Appropriately tender to palpation to the left upper quadrant as well as midline area.  Incisions clean dry and intact.  Disposition: Discharge disposition: 01-Home or Self Care       Discharge Instructions     Diet - low sodium heart healthy   Complete by: As directed    Increase activity slowly   Complete by: As directed       Allergies as of 02/13/2023       Reactions   Baclofen Other (See Comments)   Hyperactivity   Gabapentin Other (See Comments)   Made her hyper   Nitrofurantoin Rash        Medication List     TAKE these medications    acetaminophen 500 MG tablet Commonly known as: TYLENOL Take 1,000 mg by mouth every 6 (six) hours as needed for moderate pain.   amLODipine 10 MG tablet Commonly known as: NORVASC TAKE 1 TABLET EVERY DAY   aspirin EC 81 MG  tablet Take 1 tablet (81 mg total) by mouth daily.   cholecalciferol 25 MCG (1000 UNIT) tablet Commonly known as: VITAMIN D3 Take 1,000 Units by mouth daily.   cyclobenzaprine 5 MG tablet Commonly known as: FLEXERIL Take 1 tablet (5 mg total) by mouth 3 (three) times daily as needed for muscle spasms.   diclofenac Sodium 1 % Gel Commonly known as: VOLTAREN Apply 1 application topically 4 (four) times daily as needed (hip pain).   estradiol 0.1 MG/GM vaginal cream Commonly known as: ESTRACE Place 0.5 g vaginally 2 (two) times a week. Place 0.5g nightly for two weeks then twice a week after   famotidine 20 MG tablet Commonly known as: PEPCID TAKE 1 TABLET AT BEDTIME AS NEEDED FOR HEARTBURN OR INDIGESTION.   furosemide 20 MG tablet Commonly known as: Lasix Take 1 tablet (20 mg total) by mouth daily. What changed:  when to take this reasons to take this   HYDROcodone-acetaminophen 5-325 MG tablet Commonly known as: NORCO/VICODIN Take 1-2 tablets by mouth every 4 (four) hours as needed for moderate pain.   isosorbide mononitrate 60 MG 24 hr tablet Commonly known as: IMDUR TAKE 1 AND 1/2 TABLETS EVERY DAY   losartan 100 MG tablet Commonly known as: COZAAR TAKE 1 TABLET EVERY DAY What changed:  how much to take when to take this   Magnesium Oxide 250 MG Tabs Take 250 mg by mouth every morning.   metoprolol succinate 25  MG 24 hr tablet Commonly known as: TOPROL-XL Take 0.5 tablets (12.5 mg total) by mouth daily.   omeprazole 20 MG capsule Commonly known as: PRILOSEC TAKE 1 CAPSULE EVERY DAY AS NEEDED (NEED OFFICE VISIT BEFORE ANY FURTHER REFILLS) What changed: See the new instructions.   Optase Comfort Dry Eye 1 % Soln Generic drug: Glycerin (PF) Place 1 drop into both eyes 4 (four) times daily.   Pataday 0.2 % Soln Generic drug: Olopatadine HCl Place 1 drop into both eyes daily.   pramipexole 0.25 MG tablet Commonly known as: MIRAPEX TAKE 1 TABLET TWICE  DAILY   simvastatin 20 MG tablet Commonly known as: ZOCOR TAKE 1 TABLET AT BEDTIME   True Metrix Air Glucose Meter w/Device Kit USE TO CHECK BLOOD SUGAR ONCE DAILY AND AS NEEDED.  DX CODE E11.9   True Metrix Blood Glucose Test test strip Generic drug: glucose blood TEST BLOOD SUGAR EVERY DAY  OR AS NEEDED   TRUEplus Lancets 33G Misc USE TO CHECK BLOOD SUGAR ONCE DAILY AND AS NEEDED.  DX CODE: E11.9        Follow-up Information     Axel Filler, MD. Schedule an appointment as soon as possible for a visit in 2 week(s).   Specialty: General Surgery Why: Post op visit Contact information: [address] [place] [address] [place] [address] [phone]                 Signed: Axel Filler 02/13/2023, 7:58 AM

## 2023-02-13 NOTE — Progress Notes (Signed)
AVS given and reviewed with pt and daughter, [name], at bedside. Medications discussed. All questions answered to satisfaction. Pt and daughter verbalized understanding of information given. Pt to be escorted off the unit with all belongings via wheelchair by staff member.

## 2023-02-13 NOTE — ED Triage Notes (Signed)
Pt presents with fever of 101.1 at home. She was discharged today from this facility after hernia surgery.  Pt states that about 5 this evening she began having a feeling that her face was on fire but was having chills.  Called her surgeon who advised she come in.

## 2023-02-13 NOTE — Discharge Instructions (Signed)

## 2023-02-14 ENCOUNTER — Ambulatory Visit: Payer: Self-pay

## 2023-02-14 ENCOUNTER — Telehealth: Payer: Self-pay | Admitting: *Deleted

## 2023-02-14 ENCOUNTER — Encounter: Payer: Self-pay | Admitting: *Deleted

## 2023-02-14 ENCOUNTER — Observation Stay (HOSPITAL_BASED_OUTPATIENT_CLINIC_OR_DEPARTMENT_OTHER): Payer: Medicare HMO

## 2023-02-14 DIAGNOSIS — R0781 Pleurodynia: Secondary | ICD-10-CM | POA: Diagnosis not present

## 2023-02-14 DIAGNOSIS — R0600 Dyspnea, unspecified: Secondary | ICD-10-CM | POA: Diagnosis not present

## 2023-02-14 DIAGNOSIS — K430 Incisional hernia with obstruction, without gangrene: Secondary | ICD-10-CM | POA: Diagnosis not present

## 2023-02-14 DIAGNOSIS — R509 Fever, unspecified: Secondary | ICD-10-CM | POA: Diagnosis not present

## 2023-02-14 DIAGNOSIS — M79661 Pain in right lower leg: Secondary | ICD-10-CM | POA: Diagnosis not present

## 2023-02-14 DIAGNOSIS — I2510 Atherosclerotic heart disease of native coronary artery without angina pectoris: Secondary | ICD-10-CM | POA: Diagnosis not present

## 2023-02-14 DIAGNOSIS — J9811 Atelectasis: Secondary | ICD-10-CM | POA: Diagnosis not present

## 2023-02-14 MED ORDER — DOCUSATE SODIUM 100 MG PO CAPS
100.0000 mg | ORAL_CAPSULE | Freq: Two times a day (BID) | ORAL | Status: DC
  Filled 2023-02-14: qty 1

## 2023-02-14 MED ORDER — ASPIRIN 81 MG PO TBEC
81.0000 mg | DELAYED_RELEASE_TABLET | Freq: Every day | ORAL | Status: DC
  Filled 2023-02-14: qty 1

## 2023-02-14 MED ORDER — ONDANSETRON HCL 4 MG/2ML IJ SOLN
4.0000 mg | Freq: Once | INTRAMUSCULAR | Status: AC
  Administered 2023-02-14: 4 mg via INTRAVENOUS
  Filled 2023-02-14: qty 2

## 2023-02-14 MED ORDER — PRAMIPEXOLE DIHYDROCHLORIDE 0.25 MG PO TABS
0.2500 mg | ORAL_TABLET | Freq: Two times a day (BID) | ORAL | Status: DC
  Filled 2023-02-14: qty 1

## 2023-02-14 MED ORDER — ONDANSETRON 4 MG PO TBDP: 4.0000 mg | ORAL_TABLET | Freq: Four times a day (QID) | ORAL | Status: DC | PRN

## 2023-02-14 MED ORDER — OXYCODONE HCL 5 MG PO TABS: 5.0000 mg | ORAL_TABLET | Freq: Four times a day (QID) | ORAL | Status: DC | PRN

## 2023-02-14 MED ORDER — CYCLOBENZAPRINE HCL 10 MG PO TABS: 5.0000 mg | ORAL_TABLET | Freq: Three times a day (TID) | ORAL | Status: DC | PRN

## 2023-02-14 MED ORDER — MORPHINE SULFATE (PF) 2 MG/ML IV SOLN: 2.0000 mg | INTRAVENOUS | Status: DC | PRN

## 2023-02-14 MED ORDER — ISOSORBIDE MONONITRATE ER 30 MG PO TB24
60.0000 mg | ORAL_TABLET | Freq: Every day | ORAL | Status: DC
  Filled 2023-02-14: qty 2

## 2023-02-14 MED ORDER — PIPERACILLIN-TAZOBACTAM 3.375 G IVPB 30 MIN
3.3750 g | Freq: Once | INTRAVENOUS | Status: AC
  Administered 2023-02-14: 3.375 g via INTRAVENOUS
  Filled 2023-02-14: qty 50

## 2023-02-14 MED ORDER — FENTANYL CITRATE PF 50 MCG/ML IJ SOSY
50.0000 ug | PREFILLED_SYRINGE | Freq: Once | INTRAMUSCULAR | Status: AC
  Administered 2023-02-14: 50 ug via INTRAVENOUS
  Filled 2023-02-14: qty 1

## 2023-02-14 MED ORDER — ACETAMINOPHEN 500 MG PO TABS: 1000.0000 mg | ORAL_TABLET | Freq: Four times a day (QID) | ORAL | Status: DC

## 2023-02-14 MED ORDER — AMLODIPINE BESYLATE 5 MG PO TABS
10.0000 mg | ORAL_TABLET | Freq: Every day | ORAL | Status: DC
  Filled 2023-02-14: qty 2

## 2023-02-14 MED ORDER — ONDANSETRON HCL 4 MG/2ML IJ SOLN: 4.0000 mg | Freq: Four times a day (QID) | INTRAMUSCULAR | Status: DC | PRN

## 2023-02-14 MED ORDER — PANTOPRAZOLE SODIUM 40 MG PO TBEC
40.0000 mg | DELAYED_RELEASE_TABLET | Freq: Every day | ORAL | Status: DC
  Filled 2023-02-14: qty 1

## 2023-02-14 MED ORDER — POLYETHYLENE GLYCOL 3350 17 G PO PACK
17.0000 g | PACK | Freq: Every day | ORAL | Status: DC
  Filled 2023-02-14: qty 1

## 2023-02-14 MED ORDER — POLYETHYLENE GLYCOL 3350 17 G PO PACK: 17.0000 g | PACK | Freq: Every day | ORAL | Status: DC | PRN

## 2023-02-14 MED ORDER — METOPROLOL SUCCINATE ER 25 MG PO TB24
12.5000 mg | ORAL_TABLET | Freq: Every day | ORAL | Status: DC
  Filled 2023-02-14: qty 1

## 2023-02-14 MED ORDER — LOSARTAN POTASSIUM 50 MG PO TABS
50.0000 mg | ORAL_TABLET | Freq: Two times a day (BID) | ORAL | Status: DC
  Filled 2023-02-14: qty 1

## 2023-02-14 MED ORDER — MORPHINE SULFATE (PF) 4 MG/ML IV SOLN
4.0000 mg | Freq: Once | INTRAVENOUS | Status: AC
  Administered 2023-02-14: 4 mg via INTRAVENOUS
  Filled 2023-02-14: qty 1

## 2023-02-14 MED ORDER — ENOXAPARIN SODIUM 40 MG/0.4ML IJ SOSY
40.0000 mg | PREFILLED_SYRINGE | INTRAMUSCULAR | Status: DC
  Filled 2023-02-14: qty 0.4

## 2023-02-14 MED ORDER — IOHEXOL 350 MG/ML SOLN
75.0000 mL | Freq: Once | INTRAVENOUS | Status: AC | PRN
  Administered 2023-02-14: 75 mL via INTRAVENOUS

## 2023-02-14 MED ORDER — SIMVASTATIN 20 MG PO TABS: 20.0000 mg | ORAL_TABLET | Freq: Every day | ORAL | Status: DC

## 2023-02-14 NOTE — Transitions of Care (Post Inpatient/ED Visit) (Signed)
   02/14/2023  Name: Heather [name] MRN: [id number] DOB: 08-22-1934  Today's TOC FU Call Status: Today's TOC FU Call Status:: Unsuccessful Call (1st Attempt) Unsuccessful Call (1st Attempt) Date: 02/14/23  Attempted to reach the patient regarding the most recent Inpatient visit; left HIPAA compliant voice message requesting call back  Follow Up Plan: Additional outreach attempts will be made to reach the patient to complete the Transitions of Care (Post Inpatient visit) call.   Caryl Pina, RN, BSN, CCRN Alumnus RN CM Care Coordination/ Transition of Care- [facility] Care Management [phone]: direct office

## 2023-02-14 NOTE — Discharge Summary (Signed)
Physician Discharge Summary  Patient ID: Heather [name] MRN: [id number] DOB/AGE: 08/18/1934 88 y.o.  Admit date: 02/13/2023 Discharge date: 02/14/2023  Admission Diagnoses: Abdominal pain status post hernia repair  Discharge Diagnoses: Atelectasis Principal Problem:   S/P hernia repair   Discharged Condition: good  Hospital Course: Patient was brought to the ER secondary to abdominal pain, chest tightness. Patient woke up per EDP.  Patient was found to have no active ACS.  Patient underwent CT scan for PE protocol this was negative.  Patient also underwent duplex ultrasound of the lower extremities.  This was negative for DVT.  Patient overall while being observed in the ER had better pain control.  Patient was breathing better on her own.  We discussed admission versus discharge from the ER she was okay with discharge from the ER.  Patient was discharged home.  Consults: None  Significant Diagnostic Studies: radiology: CT scan: Negative for PE, abdominal CT scan with appropriate postoperative findings  Treatments: IV fluids, pain control  Discharge Exam: Blood pressure (!) 157/61, pulse 60, temperature 98.9 F (37.2 C), temperature source Oral, resp. rate 16, height 5\' 2"  (1.575 m), weight 67 kg, SpO2 99%. General appearance: alert and cooperative GI: soft, non-tender; bowel sounds normal; no masses,  no organomegaly and incision clean dry and intact.  Disposition: Discharge disposition: 01-Home or Self Care       Discharge Instructions     Diet - low sodium heart healthy   Complete by: As directed    Increase activity slowly   Complete by: As directed       Allergies as of 02/14/2023       Reactions   Baclofen Other (See Comments)   Hyperactivity   Gabapentin Other (See Comments)   Made her hyper   Nitrofurantoin Rash        Medication List     TAKE these medications    acetaminophen 500 MG tablet Commonly known as: TYLENOL Take 1,000 mg by mouth every  6 (six) hours as needed for moderate pain.   amLODipine 10 MG tablet Commonly known as: NORVASC TAKE 1 TABLET EVERY DAY   aspirin EC 81 MG tablet Take 1 tablet (81 mg total) by mouth daily.   cholecalciferol 25 MCG (1000 UNIT) tablet Commonly known as: VITAMIN D3 Take 1,000 Units by mouth daily.   cyclobenzaprine 5 MG tablet Commonly known as: FLEXERIL Take 1 tablet (5 mg total) by mouth 3 (three) times daily as needed for muscle spasms.   diclofenac Sodium 1 % Gel Commonly known as: VOLTAREN Apply 1 application topically 4 (four) times daily as needed (hip pain).   estradiol 0.1 MG/GM vaginal cream Commonly known as: ESTRACE Place 0.5 g vaginally 2 (two) times a week. Place 0.5g nightly for two weeks then twice a week after   HYDROcodone-acetaminophen 5-325 MG tablet Commonly known as: NORCO/VICODIN Take 1-2 tablets by mouth every 4 (four) hours as needed for moderate pain.   isosorbide mononitrate 60 MG 24 hr tablet Commonly known as: IMDUR TAKE 1 AND 1/2 TABLETS EVERY DAY   losartan 100 MG tablet Commonly known as: COZAAR TAKE 1 TABLET EVERY DAY What changed:  how much to take when to take this   Magnesium Oxide 250 MG Tabs Take 250 mg by mouth every morning.   metoprolol succinate 25 MG 24 hr tablet Commonly known as: TOPROL-XL Take 0.5 tablets (12.5 mg total) by mouth daily.   omeprazole 20 MG capsule Commonly known as: PRILOSEC TAKE 1  CAPSULE EVERY DAY AS NEEDED (NEED OFFICE VISIT BEFORE ANY FURTHER REFILLS) What changed: See the new instructions.   Optase Comfort Dry Eye 1 % Soln Generic drug: Glycerin (PF) Place 1 drop into both eyes in the morning and at bedtime.   Pataday 0.2 % Soln Generic drug: Olopatadine HCl Place 1 drop into both eyes daily.   pramipexole 0.25 MG tablet Commonly known as: MIRAPEX TAKE 1 TABLET TWICE DAILY   simvastatin 20 MG tablet Commonly known as: ZOCOR TAKE 1 TABLET AT BEDTIME   True Metrix Air Glucose Meter  w/Device Kit USE TO CHECK BLOOD SUGAR ONCE DAILY AND AS NEEDED.  DX CODE E11.9   True Metrix Blood Glucose Test test strip Generic drug: glucose blood TEST BLOOD SUGAR EVERY DAY  OR AS NEEDED   TRUEplus Lancets 33G Misc USE TO CHECK BLOOD SUGAR ONCE DAILY AND AS NEEDED.  DX CODE: E11.9         Signed: Axel Filler 02/14/2023, 9:55 AM

## 2023-02-14 NOTE — H&P (Signed)
Heather [name] 08/30/1934  [id number].     HPI:  Heather [name] is an 88 yo female who recently underwent an open ventral hernia repair with adhesiolysis and retrorectus mesh placement on 8/5 by Dr. Derrell Lolling. She was discharged home yesterday morning. She presented to the ED overnight with fevers and shortness of breath. She had a fever to 101.1 yesterday afternoon, which was associated with chills and malaise. She also reports significant abdominal pain that makes it difficult to move and take deep breaths. Labs in the ED showed a normal WBC. A CT PE and abd/pelvis did not show any PE or signs of pneumonia. She did have some fluid in the abdominal wall at the surgical site. General surgery was consulted. On my exam the patient also reports pain in the right calf. She has not had a bowel movement in the last few days and says she is not passing flatus, but denies nausea or vomiting.   ROS: Review of Systems  Constitutional:  Positive for chills, fever and malaise/fatigue.  Respiratory:  Positive for shortness of breath.   Cardiovascular:  Negative for chest pain and leg swelling.  Gastrointestinal:  Positive for abdominal pain and constipation. Negative for nausea and vomiting.    Family History  Problem Relation Age of Onset   Stroke Father        family hx of M 1st degree relative <50   Heart disease Father    Coronary artery disease Mother    Heart disease Mother    Arthritis Mother    Hearing loss Mother    Vision loss Mother    Depression Brother    Cancer Brother    Stroke Brother    Diabetes Brother    Cancer Brother        bladder with mets   Diabetes Daughter        borderline   Hypertension Daughter    Arthritis Other        family hx of   Hypertension Other        family hx of   Other Other        family hx of cardiovascular disorder   Thyroid disease Daughter    Early death Daughter    Breast cancer Neg Hx    Colon cancer Neg Hx    Anesthesia problems Neg Hx     Hypotension Neg Hx    Malignant hyperthermia Neg Hx    Pseudochol deficiency Neg Hx    Colon polyps Neg Hx    Esophageal cancer Neg Hx    Rectal cancer Neg Hx    Stomach cancer Neg Hx     Past Medical History:  Diagnosis Date   Aortic atherosclerosis (HCC) 10/12/2021   Cancer (HCC)    Uterine   Cataract    CHF (congestive heart failure) (HCC)    Chronic stable angina    Coronary artery disease cardiologist--- dr Excell Seltzer   hx NSTEMI  w/ cardiac cath 03-16-2005 PCI with DES to LCx;   cardiac cath 06-27-2005  PCI with DES to RCA with residual dx LAD manage medcially;  lexiscan 01-26-20211 normal no ischemia, ef 70%;  stress echo w/ dobutamine 10-24-2011 negative ishcmeia , normal ef // Myoview 2/22: EF 67, no ischemia or infarction, no TID, low risk    Diabetes mellitus type 2, diet-controlled (HCC)    followed by pcp  (10-19-2020  pt stated checks daily in am,  fasting blood surgar--- 115--120s)   DOE (dyspnea on  exertion)    per pt "when I over do",  ok with household chores   Echocardiogram 08/2020    Echocardiogram 2/22: EF 55-60, no RWMA, mild LVH, Gr 2 DD, GLS-21.7%, normal RVSF, trivial MR, RVSP 39.5   Edema of right lower extremity    GERD (gastroesophageal reflux disease)    Hiatal hernia    recurrence,  hx HH repair 1989   History of cervical cancer    s/p  vaginal hysterectomy   History of DVT of lower extremity 2016   11-29-2014 post op right TKA of right lower extremity and completed xarelto    History of esophageal stricture    hx s/p dilatation's   History of gastric ulcer 2005 approx.   History of palpitations 2010   event monitor 07-07-2009 showed NSR w/ freq. SVT ectopies with short runs, rare PVCs   History of TIA (transient ischemic attack) 06/1999   12-15-2019  per pt had several TIA between 12/ 2000 to 02/ 2001 , was sent to specialist @[facility] , had test that was normal (10-19-2020 pt stated no TIAs since ) but has residual of essential tremors of right arm/ hand    Hypertension    Intermittent palpitations    IT band syndrome    Migraine    "ice pick headche lasts about 30 seconds"   Mixed hyperlipidemia    Mixed incontinence urge and stress    Multiple thyroid nodules    followed by pcp---   ultrasound 11-22-2014 no bx   (12-15-2019 per pt had a endocrinologist and was told did not need bx)   OA (osteoarthritis)    knees, elbow, hip, ankles   Occasional tremors    right arm/ hand  s/p TIA residual 2000   Osteoporosis    taking vitamin d   Peroneal DVT (deep venous thrombosis) (HCC) 12/22/2014   Right bundle branch block (RBBB) with left anterior fascicular block (LAFB)    RLS (restless legs syndrome)    S/P drug eluting coronary stent placement 2006   03-16-2005  PCI x1 DES to LCx;   06-27-2005  PCI x1 DES to RCA   Stroke [facility])    TIA's   Urinary retention    post op sling prodecure on 10-27-2020, has foley cathether    Past Surgical History:  Procedure Laterality Date   ANTERIOR AND POSTERIOR REPAIR N/A 12/22/2019   Procedure: ANTERIOR (CYSTOCELE)  REPAIR;  Surgeon: Sherian Rein, MD;  Location: [facility];  Service: Gynecology;  Laterality: N/A;   ANTERIOR AND POSTERIOR REPAIR WITH SACROSPINOUS FIXATION N/A 10/27/2020   Procedure: SACROSPINOUS LIGAMENT FIXATION;  Surgeon: Marguerita Beards, MD;  Location: [facility];  Service: Gynecology;  Laterality: N/A;   BLADDER SUSPENSION N/A 10/27/2020   Procedure: TRANSVAGINAL TAPE (TVT) PROCEDURE;  Surgeon: Marguerita Beards, MD;  Location: [facility];  Service: Gynecology;  Laterality: N/A;   CATARACT EXTRACTION W/ INTRAOCULAR LENS  IMPLANT, BILATERAL  2015   CHOLECYSTECTOMY N/A 01/25/2021   Procedure: LAPAROSCOPIC CHOLECYSTECTOMY WITH INTRAOPERATIVE CHOLANGIOGRAM AND LYSIS OF ADHESIONS;  Surgeon: Karie Soda, MD;  Location: [facility];  Service: General;  Laterality: N/A;   COLONOSCOPY  last one ?   CORONARY ANGIOPLASTY WITH  STENT PLACEMENT  03-16-2005   dr Riley Kill   PCI and DES x1 to LCx   CORONARY ANGIOPLASTY WITH STENT PLACEMENT  06-27-2005  dr Riley Kill   PCI and DES x1 to RCA with residual disease LAD 70-80% to manage medically  CYSTOSCOPY N/A 10/27/2020   Procedure: CYSTOSCOPY;  Surgeon: Marguerita Beards, MD;  Location: [facility];  Service: Gynecology;  Laterality: N/A;   CYSTOSCOPY N/A 11/09/2020   Procedure: CYSTOSCOPY;  Surgeon: Marguerita Beards, MD;  Location: [facility];  Service: Gynecology;  Laterality: N/A;   EYE SURGERY     FOOT SURGERY Left 1990s   left foot stress fracture repair, per pt no hardware   FRACTURE SURGERY     HERNIA REPAIR     HIATAL HERNIA REPAIR  1989   INCISIONAL HERNIA REPAIR N/A 01/25/2021   Procedure: PRIMARY REPAIR OF INCISIONAL HERNIA;  Surgeon: Karie Soda, MD;  Location: [facility];  Service: General;  Laterality: N/A;   INGUINAL HERNIA REPAIR Left 04/17/2021   Procedure: LAPAROSCOPIC LEFT INGUINAL HERNIA REPAIR WITH MESH;  Surgeon: Axel Filler, MD;  Location: [facility];  Service: General;  Laterality: Left;   JOINT REPLACEMENT     KNEE ARTHROSCOPY Bilateral right ?/   left x2 , last one 09-12-2009 @ [facility]   LYSIS OF ADHESION N/A 02/11/2023   Procedure: LYSIS OF ADHESION;  Surgeon: Axel Filler, MD;  Location: [facility];  Service: General;  Laterality: N/A;   PUBOVAGINAL SLING N/A 11/09/2020   Procedure: REVISION OF Leonides Grills;  Surgeon: Marguerita Beards, MD;  Location: [facility];  Service: Gynecology;  Laterality: N/A;   RECTOCELE REPAIR N/A 04/20/2020   Procedure: POSTERIOR REPAIR (RECTOCELE);  Surgeon: Sherian Rein, MD;  Location: [facility];  Service: Gynecology;  Laterality: N/A;   RECTOCELE REPAIR N/A 10/27/2020   Procedure: POSTERIOR REPAIR (RECTOCELE);  Surgeon: Marguerita Beards, MD;  Location: [facility];  Service: Gynecology;  Laterality: N/A;   total time requested for all procedures is 2 hours   SMALL INTESTINE SURGERY     TOTAL KNEE ARTHROPLASTY  11/12/2011   Procedure: TOTAL KNEE ARTHROPLASTY;  Surgeon: Nilda Simmer, MD;  Location: [facility];  Service: Orthopedics;  Laterality: Left;  Dr Thurston Hole wants 90 minutes for this case   TOTAL KNEE ARTHROPLASTY Right 11/29/2014   Procedure: RIGHT TOTAL KNEE ARTHROPLASTY;  Surgeon: Dannielle Huh, MD;  Location: [facility];  Service: Orthopedics;  Laterality: Right;   UPPER GASTROINTESTINAL ENDOSCOPY  last one 04-25-2017   with dilatation esophageal stricture and savary dilatation   VAGINAL HYSTERECTOMY  1988    no ovaries removed for bleeeding   VENTRAL HERNIA REPAIR N/A 02/11/2023   Procedure: LAPAROSCOPIC ATTEMPTED OPEN VENTRAL HERNIA REPAIR WITH MESH;  Surgeon: Axel Filler, MD;  Location: [facility] OR;  Service: General;  Laterality: N/A;  2.5 HRS    Social History:  reports that she has never smoked. She has never used smokeless tobacco. She reports that she does not drink alcohol and does not use drugs.  Allergies:  Allergies  Allergen Reactions   Baclofen Other (See Comments)    Hyperactivity    Gabapentin Other (See Comments)    Made her hyper   Nitrofurantoin Rash    (Not in a hospital admission)    Physical Exam: Blood pressure (!) 157/61, pulse 60, temperature 99.1 F (37.3 C), temperature source Oral, resp. rate 16, height 5\' 2"  (1.575 m), weight 67 kg, SpO2 99%. General: resting comfortably, no apparent distress Neurological: alert and oriented, no focal deficits, cranial nerves grossly in tact HEENT: normocephalic, atraumatic CV: regular rate and rhythm, extremities warm and well-perfused Respiratory: normal work of breathing on nasal cannula, symmetric chest wall expansion Abdomen: soft, nondistended,  nontender to palpation. Midline incision is clean and dry with no erythema or induration. There is ecchymosis on the left flank, which was documented on previous postop  exams. Extremities: warm and well-perfused, no lower extremity swelling. Psychiatric: normal mood and affect Skin: warm and dry, no jaundice, no rashes or lesions    Assessment/Plan 88 yo female POD3 s/p ventral hernia repair with adhesiolysis and mesh placement, presenting with fevers and pain. I reviewed her CT scan. There is small-volume pneumoperitoneum, as well as some fluid in the retrorectus space, all of which appear consistent with expected postoperative changes. There are no signs of infection or bowel obstruction, and no clinical signs concerning for a missed enterotomy. COVID test and UA were negative. Thus far patient's fever workup has been negative. I suspect she has some atelectasis secondary to poor pain control and splinting. - Multimodal pain control, bowel regimen - Heart healthy diet - Blood cultures pending - Lower extremity venous duplex US given calf pain (patient reports a remove history of postop DVT) - No antibiotics unless workup shows an infectious source for fevers. - Continue home cardiac meds - VTE: lovenox, SCDs - Admit to observation, med-surg floor   Sophronia Simas, MD [facility] Surgery General, Hepatobiliary and Pancreatic Surgery 02/14/23 6:35 AM

## 2023-02-14 NOTE — Progress Notes (Signed)
VASCULAR LAB    Bilateral lower extremity venous duplex has been performed.  See CV proc for preliminary results.   , , RVT 02/14/2023, 9:04 AM

## 2023-02-14 NOTE — Chronic Care Management (AMB) (Signed)
   02/14/2023  Heather [name] [name] 02/05/1935 [id number]   Reason for Encounter: Patient is not currently enrolled in the CCM program. CCM status changed to previously enrolled  Alto Denver RN, MSN, CCM RN Care Manager  [facility]  Ambulatory Care Management  Direct Number: [phone]

## 2023-02-14 NOTE — ED Provider Notes (Signed)
Received patient in turnover from Dr. Rush Landmark.  Please see their note for further details of Hx, PE.  Briefly patient is a 88 y.o. female with a Post Op Fever .  Patient recently postop from a hernia repair.  Plan for CT imaging likely discussion with general surgery.  I discussed case with Dr. Freida Busman, general surgery thought CT scan was consistent with normal postoperative changes.   Patient without leukocytosis, normal lactate no fever here.  Patient reassessed and still quite uncomfortable.  Recommended holding abx though has already gotten zosyn. Will admit.      Melene Plan, DO 02/14/23 [phone]

## 2023-02-14 NOTE — ED Notes (Signed)
Patient transported to vascular. 

## 2023-02-15 ENCOUNTER — Encounter: Payer: Self-pay | Admitting: *Deleted

## 2023-02-15 ENCOUNTER — Telehealth: Payer: Self-pay | Admitting: *Deleted

## 2023-02-15 NOTE — Transitions of Care (Post Inpatient/ED Visit) (Signed)
02/15/2023  Name: Heather [name] MRN: [id number] DOB: 1935-06-07  Today's TOC FU Call Status: Today's TOC FU Call Status:: Successful TOC FU Call Completed TOC FU Call Complete Date: 02/15/23  Transition Care Management Follow-up Telephone Call Date of Discharge: 02/13/23 Discharge Facility: [facility]) Type of Discharge: Inpatient Admission Primary Inpatient Discharge Diagnosis:: surgical hernia repair with post- discharge ED visit How have you been since you were released from the hospital?: Same Any questions or concerns?: Yes Patient Questions/Concerns:: "I don't have an appointment to follow up with the surgeon until the end of August; I have been through so much, I am afraid I may have more complications" Patient Questions/Concerns Addressed: Other: (facilitated scheduling of HFU PCP OV; placed care coordination follow up RN CM Care Coordination telephone outreach; provided my direct phone number should needs arise post-TOC call today)  Items Reviewed: Did you receive and understand the discharge instructions provided?: Yes (thoroughly reviewed with patient who verbalizes good understanding of same) Medications obtained,verified, and reconciled?: Partial Review Completed Reason for Partial Mediation Review: Patient declined full review--confirmed patient obtained/ is taking all newly Rx'd medications as instructed; self-manages medications and denies questions/ concerns around medications today Any new allergies since your discharge?: No Dietary orders reviewed?: Yes Type of Diet Ordered:: soft, progressing slowly Do you have support at home?: Yes People in Home: alone Name of Support/Comfort Primary Source: Reportsresides alone/ independent in self-care activities at baseline; supportive daughters currently temporarily residing with patient post-hospital discharge and assisting as/ if needed/ indicated  Medications Reviewed Today: Medications Reviewed Today     Reviewed by  Michaela Corner, RN (Registered Nurse) on 02/15/23 at 1114  Med List Status: <None>   Medication Order Taking? Sig Documenting Provider Last Dose Status Informant  acetaminophen (TYLENOL) 500 MG tablet [id number]  Take 1,000 mg by mouth every 6 (six) hours as needed for moderate pain. [provider]  Active Self, Child, Pharmacy Records  amLODipine (NORVASC) 10 MG tablet [id number]  TAKE 1 TABLET EVERY DAY Bradd Canary, MD  Active Self, Child, Pharmacy Records           Med Note Berneda Rose, [facility]   Thu Feb 14, 2023  7:17 AM)    aspirin EC 81 MG tablet [id number]  Take 1 tablet (81 mg total) by mouth daily. Tonny Bollman, MD  Active Self, Child, Pharmacy Records  Blood Glucose Monitoring Suppl (TRUE METRIX AIR GLUCOSE METER) w/Device KIT [id number]  USE TO CHECK BLOOD SUGAR ONCE DAILY AND AS NEEDED.  DX CODE E11.9 Bradd Canary, MD  Active Self, Child, Pharmacy Records  cholecalciferol (VITAMIN D3) 25 MCG (1000 UNIT) tablet [id number]  Take 1,000 Units by mouth daily. [provider]  Active Self, Child, Pharmacy Records  cyclobenzaprine (FLEXERIL) 5 MG tablet [id number] Yes Take 1 tablet (5 mg total) by mouth 3 (three) times daily as needed for muscle spasms. Axel Filler, MD Taking Active Self, Child, Pharmacy Records           Med Note Berneda Rose, [facility]   Thu Feb 14, 2023  7:45 AM) Daughter picked this up yesterday(02/13/23),but has not taken it since she was admitted yesterday.  diclofenac Sodium (VOLTAREN) 1 % GEL [id number]  Apply 1 application topically 4 (four) times daily as needed (hip pain). [provider]  Active Self, Child, Pharmacy Records  estradiol (ESTRACE) 0.1 MG/GM vaginal cream [id number]  Place 0.5 g vaginally 2 (two) times a week. Place 0.5g nightly for two weeks then  twice a week after Marguerita Beards, MD  Active Self, Child, Pharmacy Records  glucose blood (TRUE METRIX BLOOD GLUCOSE TEST) test strip [id number]  TEST BLOOD SUGAR EVERY DAY  OR  AS NEEDED Bradd Canary, MD  Active Self, Child, Pharmacy Records  Glycerin, PF, (OPTASE COMFORT DRY EYE) 1 % SOLN [id number]  Place 1 drop into both eyes in the morning and at bedtime. [provider]  Active Self, Child, Pharmacy Records  HYDROcodone-acetaminophen (NORCO/VICODIN) 5-325 MG tablet [id number] Yes Take 1-2 tablets by mouth every 4 (four) hours as needed for moderate pain. Axel Filler, MD Taking Active Self, Child, Pharmacy Records           Med Note Berneda Rose, [facility]   Thu Feb 14, 2023  7:47 AM) Daughter picked this up yesterday(02/13/23),but has not taken it since she was admitted yesterday.  isosorbide mononitrate (IMDUR) 60 MG 24 hr tablet [id number]  TAKE 1 AND 1/2 TABLETS EVERY DAY Tonny Bollman, MD  Active Self, Child, Pharmacy Records  losartan (COZAAR) 100 MG tablet [id number]  TAKE 1 TABLET EVERY DAY  Patient taking differently: Take 50 mg by mouth 2 (two) times daily.   Tonny Bollman, MD  Active Self, Child, Pharmacy Records  Magnesium Oxide 250 MG TABS [id number]  Take 250 mg by mouth every morning. [provider]  Active Self, Child, Pharmacy Records           Med Note Worthy Rancher, Dorna Mai Apr 17, 2021  7:56 AM)    metoprolol succinate (TOPROL-XL) 25 MG 24 hr tablet [id number]  Take 0.5 tablets (12.5 mg total) by mouth daily. Bradd Canary, MD  Active Self, Child, Pharmacy Records  Olopatadine HCl (PATADAY) 0.2 % SOLN [id number]  Place 1 drop into both eyes daily. [provider]  Active Self, Child, Pharmacy Records  omeprazole (PRILOSEC) 20 MG capsule [id number]  TAKE 1 CAPSULE EVERY DAY AS NEEDED (NEED OFFICE VISIT BEFORE ANY FURTHER REFILLS)  Patient taking differently: Take 20 mg by mouth every morning.   Bradd Canary, MD  Active Self, Child, Pharmacy Records  pramipexole (MIRAPEX) 0.25 MG tablet [id number]  TAKE 1 TABLET TWICE DAILY Bradd Canary, MD  Active Self, Child, Pharmacy Records  simvastatin (ZOCOR) 20 MG tablet  [id number]  TAKE 1 TABLET AT BEDTIME Tonny Bollman, MD  Active Self, Child, Pharmacy Records  TRUEplus Lancets 33G MISC [id number]  USE TO CHECK BLOOD SUGAR ONCE DAILY AND AS NEEDED.  DX CODE: E11.9 Bradd Canary, MD  Active Self, Child, Pharmacy Records           Home Care and Equipment/Supplies: Were Home Health Services Ordered?: No Any new equipment or medical supplies ordered?: No  Functional Questionnaire: Do you need assistance with bathing/showering or dressing?: Yes (daughters currently assisting with all care needs as indicated post-recent surgery) Do you need assistance with meal preparation?: Yes (daughters currently assisting with all care needs as indicated post-recent surgery) Do you need assistance with eating?: No Do you have difficulty maintaining continence: No Do you need assistance with getting out of bed/getting out of a chair/moving?: Yes (daughters currently assisting with all care needs as indicated post-recent surgery) Do you have difficulty managing or taking your medications?: No  Follow up appointments reviewed: PCP Follow-up appointment confirmed?: Yes (care coordination outreach in real-time with scheduling care guide to successfully schedule hospital follow up PCP appointment 02/26/23) Date of PCP follow-up appointment?: 02/26/23 Follow-up Provider: PCP Specialist Hospital Follow-up appointment confirmed?:  Yes Date of Specialist follow-up appointment?: 03/08/23 Follow-Up Specialty Provider:: surgical provider Do you need transportation to your follow-up appointment?: No Do you understand care options if your condition(s) worsen?: Yes-patient verbalized understanding  SDOH Interventions Today    Flowsheet Row Most Recent Value  SDOH Interventions   Food Insecurity Interventions Intervention Not Indicated  Transportation Interventions Intervention Not Indicated  [normally drives self,  family assisting after recent surgery/ hospital and ED visit]       TOC Interventions Today    Flowsheet Row Most Recent Value  TOC Interventions   TOC Interventions Discussed/Reviewed TOC Interventions Discussed, Arranged PCP follow up less than 12 days/Care Guide scheduled, Post op wound/incision care  [provided my direct contact information should questions/ concerns/ needs arise post-TOC call, prior to RN CM telephone visit  03/05/23]      Interventions Today    Flowsheet Row Most Recent Value  Chronic Disease   Chronic disease during today's visit Other  [surgical hernia repair]  General Interventions   General Interventions Discussed/Reviewed General Interventions Discussed, Durable Medical Equipment (DME), Communication with, Doctor Visits, Referral to Nurse  [scheduled with RN CM Care Coordinator for follow up telephone visit on 03/05/23]  Doctor Visits Discussed/Reviewed Doctor Visits Discussed, PCP, Specialist  Durable Medical Equipment (DME) Val Riles currently requiring/ using assistive devices - walker]  PCP/Specialist Visits Compliance with follow-up visit  Communication with RN  Education Interventions   Education Provided Provided Education  Provided Verbal Education On Other  [process to initiate home health services if she feels she could benefit from this as she continues recuperating: not currently ordered post-recent surgery]  Nutrition Interventions   Nutrition Discussed/Reviewed Nutrition Discussed  Pharmacy Interventions   Pharmacy Dicussed/Reviewed Pharmacy Topics Discussed  Safety Interventions   Safety Discussed/Reviewed Safety Discussed, Fall Risk      Caryl Pina, RN, BSN, CCRN Alumnus RN CM Care Coordination/ Transition of Care- [facility] Care Management [phone]: direct office

## 2023-02-20 DIAGNOSIS — C44722 Squamous cell carcinoma of skin of right lower limb, including hip: Secondary | ICD-10-CM | POA: Diagnosis not present

## 2023-02-20 DIAGNOSIS — L814 Other melanin hyperpigmentation: Secondary | ICD-10-CM | POA: Diagnosis not present

## 2023-02-20 DIAGNOSIS — D492 Neoplasm of unspecified behavior of bone, soft tissue, and skin: Secondary | ICD-10-CM | POA: Diagnosis not present

## 2023-02-20 DIAGNOSIS — I872 Venous insufficiency (chronic) (peripheral): Secondary | ICD-10-CM | POA: Diagnosis not present

## 2023-02-20 DIAGNOSIS — L570 Actinic keratosis: Secondary | ICD-10-CM | POA: Diagnosis not present

## 2023-02-20 DIAGNOSIS — L821 Other seborrheic keratosis: Secondary | ICD-10-CM | POA: Diagnosis not present

## 2023-02-23 ENCOUNTER — Emergency Department (HOSPITAL_COMMUNITY)
Admission: EM | Admit: 2023-02-23 | Discharge: 2023-02-23 | Disposition: A | Discharge status: Home / Self Care | Payer: Medicare HMO | Source: Home / Self Care | Attending: Emergency Medicine | Admitting: Emergency Medicine

## 2023-02-23 ENCOUNTER — Encounter (HOSPITAL_COMMUNITY): Payer: Self-pay | Admitting: Emergency Medicine

## 2023-02-23 ENCOUNTER — Other Ambulatory Visit: Payer: Self-pay

## 2023-02-23 ENCOUNTER — Emergency Department (HOSPITAL_COMMUNITY): Payer: Medicare HMO

## 2023-02-23 DIAGNOSIS — I6782 Cerebral ischemia: Secondary | ICD-10-CM | POA: Diagnosis not present

## 2023-02-23 DIAGNOSIS — Y92009 Unspecified place in unspecified non-institutional (private) residence as the place of occurrence of the external cause: Secondary | ICD-10-CM | POA: Insufficient documentation

## 2023-02-23 DIAGNOSIS — R58 Hemorrhage, not elsewhere classified: Secondary | ICD-10-CM | POA: Diagnosis not present

## 2023-02-23 DIAGNOSIS — Z7982 Long term (current) use of aspirin: Secondary | ICD-10-CM | POA: Insufficient documentation

## 2023-02-23 DIAGNOSIS — M50322 Other cervical disc degeneration at C5-C6 level: Secondary | ICD-10-CM | POA: Diagnosis not present

## 2023-02-23 DIAGNOSIS — G4489 Other headache syndrome: Secondary | ICD-10-CM | POA: Diagnosis not present

## 2023-02-23 DIAGNOSIS — W01190A Fall on same level from slipping, tripping and stumbling with subsequent striking against furniture, initial encounter: Secondary | ICD-10-CM | POA: Insufficient documentation

## 2023-02-23 DIAGNOSIS — M47812 Spondylosis without myelopathy or radiculopathy, cervical region: Secondary | ICD-10-CM | POA: Diagnosis not present

## 2023-02-23 DIAGNOSIS — S0990XA Unspecified injury of head, initial encounter: Secondary | ICD-10-CM | POA: Diagnosis not present

## 2023-02-23 DIAGNOSIS — S0181XA Laceration without foreign body of other part of head, initial encounter: Secondary | ICD-10-CM | POA: Diagnosis not present

## 2023-02-23 DIAGNOSIS — G9389 Other specified disorders of brain: Secondary | ICD-10-CM | POA: Diagnosis not present

## 2023-02-23 DIAGNOSIS — Z23 Encounter for immunization: Secondary | ICD-10-CM | POA: Diagnosis not present

## 2023-02-23 DIAGNOSIS — S199XXA Unspecified injury of neck, initial encounter: Secondary | ICD-10-CM | POA: Diagnosis not present

## 2023-02-23 DIAGNOSIS — M50323 Other cervical disc degeneration at C6-C7 level: Secondary | ICD-10-CM | POA: Diagnosis not present

## 2023-02-23 MED ORDER — TETANUS-DIPHTH-ACELL PERTUSSIS 5-2.5-18.5 LF-MCG/0.5 IM SUSY
0.5000 mL | PREFILLED_SYRINGE | Freq: Once | INTRAMUSCULAR | Status: AC
  Administered 2023-02-23: 0.5 mL via INTRAMUSCULAR
  Filled 2023-02-23: qty 0.5

## 2023-02-23 MED ORDER — LIDOCAINE-EPINEPHRINE (PF) 2 %-1:200000 IJ SOLN
10.0000 mL | Freq: Once | INTRAMUSCULAR | Status: AC
  Administered 2023-02-23: 10 mL
  Filled 2023-02-23: qty 20

## 2023-02-23 NOTE — ED Triage Notes (Signed)
BIBA from home w/ laceration to left side of head after having a mechanical fall & hitting head on storm door. Bleeding noted in triage

## 2023-02-23 NOTE — Discharge Instructions (Signed)
Return for any problem.  ?

## 2023-02-23 NOTE — ED Provider Notes (Signed)
[place] EMERGENCY DEPARTMENT AT [facility] Provider Note   CSN: [id number] Arrival date & time: 02/23/23  2037     History {Add pertinent medical, surgical, social history, OB history to HPI:1} Chief Complaint  Patient presents with   Fall   Laceration    Heather [name] is a 88 y.o. female.   Fall  Laceration      Home Medications Prior to Admission medications   Medication Sig Start Date End Date Taking? Authorizing Provider  acetaminophen (TYLENOL) 500 MG tablet Take 1,000 mg by mouth every 6 (six) hours as needed for moderate pain.    [provider]  amLODipine (NORVASC) 10 MG tablet TAKE 1 TABLET EVERY DAY 11/19/22   Bradd Canary, MD  aspirin EC 81 MG tablet Take 1 tablet (81 mg total) by mouth daily. 05/04/15   Tonny Bollman, MD  Blood Glucose Monitoring Suppl (TRUE METRIX AIR GLUCOSE METER) w/Device KIT USE TO CHECK BLOOD SUGAR ONCE DAILY AND AS NEEDED.  DX CODE E11.9 07/27/20   Bradd Canary, MD  cholecalciferol (VITAMIN D3) 25 MCG (1000 UNIT) tablet Take 1,000 Units by mouth daily.    [provider]  cyclobenzaprine (FLEXERIL) 5 MG tablet Take 1 tablet (5 mg total) by mouth 3 (three) times daily as needed for muscle spasms. 02/13/23   Axel Filler, MD  diclofenac Sodium (VOLTAREN) 1 % GEL Apply 1 application topically 4 (four) times daily as needed (hip pain).    [provider]  estradiol (ESTRACE) 0.1 MG/GM vaginal cream Place 0.5 g vaginally 2 (two) times a week. Place 0.5g nightly for two weeks then twice a week after 09/25/21   Marguerita Beards, MD  glucose blood (TRUE METRIX BLOOD GLUCOSE TEST) test strip TEST BLOOD SUGAR EVERY DAY  OR AS NEEDED 09/21/21   Bradd Canary, MD  Glycerin, PF, (OPTASE COMFORT DRY EYE) 1 % SOLN Place 1 drop into both eyes in the morning and at bedtime.    [provider]  HYDROcodone-acetaminophen (NORCO/VICODIN) 5-325 MG tablet Take 1-2 tablets by mouth every 4 (four)  hours as needed for moderate pain. 02/13/23   Axel Filler, MD  isosorbide mononitrate (IMDUR) 60 MG 24 hr tablet TAKE 1 AND 1/2 TABLETS EVERY DAY 07/23/22   Tonny Bollman, MD  losartan (COZAAR) 100 MG tablet TAKE 1 TABLET EVERY DAY Patient taking differently: Take 50 mg by mouth 2 (two) times daily. 01/07/23   Tonny Bollman, MD  Magnesium Oxide 250 MG TABS Take 250 mg by mouth every morning.    [provider]  metoprolol succinate (TOPROL-XL) 25 MG 24 hr tablet Take 0.5 tablets (12.5 mg total) by mouth daily. 02/01/22   Bradd Canary, MD  Olopatadine HCl (PATADAY) 0.2 % SOLN Place 1 drop into both eyes daily.    [provider]  omeprazole (PRILOSEC) 20 MG capsule TAKE 1 CAPSULE EVERY DAY AS NEEDED (NEED OFFICE VISIT BEFORE ANY FURTHER REFILLS) Patient taking differently: Take 20 mg by mouth every morning. 07/11/22   Bradd Canary, MD  pramipexole (MIRAPEX) 0.25 MG tablet TAKE 1 TABLET TWICE DAILY 10/08/22   Bradd Canary, MD  simvastatin (ZOCOR) 20 MG tablet TAKE 1 TABLET AT BEDTIME 02/01/23   Tonny Bollman, MD  TRUEplus Lancets 33G MISC USE TO CHECK BLOOD SUGAR ONCE DAILY AND AS NEEDED.  DX CODE: E11.9 07/27/20   Bradd Canary, MD      Allergies    Baclofen, Gabapentin, and Nitrofurantoin  Review of Systems   Review of Systems  Physical Exam Updated Vital Signs BP (!) 151/68   Pulse 81   Temp 98.7 F (37.1 C) (Oral)   Resp 18   LMP  (LMP Unknown)   SpO2 97%  Physical Exam  ED Results / Procedures / Treatments   Labs (all labs ordered are listed, but only abnormal results are displayed) Labs Reviewed - No data to display  EKG None  Radiology CT Cervical Spine Wo Contrast  Result Date: 02/23/2023 CLINICAL DATA:  Status post trauma. EXAM: CT CERVICAL SPINE WITHOUT CONTRAST TECHNIQUE: Multidetector CT imaging of the cervical spine was performed without intravenous contrast. Multiplanar CT image reconstructions were also generated. RADIATION DOSE  REDUCTION: This exam was performed according to the departmental dose-optimization program which includes automated exposure control, adjustment of the mA and/or kV according to patient size and/or use of iterative reconstruction technique. COMPARISON:  None Available. FINDINGS: Alignment: Normal. Skull base and vertebrae: No acute fracture. Chronic and degenerative changes are seen involving the body and tip of the dens, as well as the adjacent portion of the anterior arch of C1. Soft tissues and spinal canal: No prevertebral fluid or swelling. No visible canal hematoma. Disc levels: Marked severity endplate sclerosis, mild anterior osteophyte formation and moderate to marked severity posterior bony spurring are seen at the levels of C2-C3, C5-C6 and C6-C7. Mild to moderate severity posterior bony spurring is seen at C3-C4 and C4-C5. There is marked severity narrowing of the anterior atlantoaxial articulation. Marked severity intervertebral disc space narrowing is seen at C5-C6 and C6-C7 with mild to moderate severity intervertebral disc space narrowing noted throughout the remainder of the cervical spine. Bilateral marked severity multilevel facet joint hypertrophy is noted. Upper chest: Negative. Other: None. IMPRESSION: Marked severity multilevel degenerative changes, as described above, without evidence of an acute fracture or subluxation. Electronically Signed   By: Aram Candela M.D.   On: 02/23/2023 21:42   CT Head Wo Contrast  Result Date: 02/23/2023 CLINICAL DATA:  Status post trauma. EXAM: CT HEAD WITHOUT CONTRAST TECHNIQUE: Contiguous axial images were obtained from the base of the skull through the vertex without intravenous contrast. RADIATION DOSE REDUCTION: This exam was performed according to the departmental dose-optimization program which includes automated exposure control, adjustment of the mA and/or kV according to patient size and/or use of iterative reconstruction technique.  COMPARISON:  February 05, 2021 FINDINGS: Brain: There is mild cerebral atrophy with widening of the extra-axial spaces and ventricular dilatation. There are areas of decreased attenuation within the white matter tracts of the supratentorial brain, consistent with microvascular disease changes. Vascular: No hyperdense vessel or unexpected calcification. Skull: Normal. Negative for fracture or focal lesion. Sinuses/Orbits: No acute finding. Other: None. IMPRESSION: 1. Generalized cerebral atrophy with chronic white matter small vessel ischemic changes. 2. No acute intracranial abnormality. Electronically Signed   By: Aram Candela M.D.   On: 02/23/2023 21:36    Procedures Procedures  {Document cardiac monitor, telemetry assessment procedure when appropriate:1}  Medications Ordered in ED Medications  lidocaine-EPINEPHrine (XYLOCAINE W/EPI) 2 %-1:200000 (PF) injection 10 mL (10 mLs Infiltration Given by Other 02/23/23 2136)  Tdap (BOOSTRIX) injection 0.5 mL (0.5 mLs Intramuscular Given 02/23/23 2055)    ED Course/ Medical Decision Making/ A&P   {   Click here for ABCD2, HEART and other calculatorsREFRESH Note before signing :1}  Medical Decision Making Amount and/or Complexity of Data Reviewed Radiology: ordered.  Risk Prescription drug management.   ***  {Document critical care time when appropriate:1} {Document review of labs and clinical decision tools ie heart score, Chads2Vasc2 etc:1}  {Document your independent review of radiology images, and any outside records:1} {Document your discussion with family members, caretakers, and with consultants:1} {Document social determinants of health affecting pt's care:1} {Document your decision making why or why not admission, treatments were needed:1} Final Clinical Impression(s) / ED Diagnoses Final diagnoses:  Injury of head, initial encounter  Laceration of forehead, initial encounter    Rx / DC Orders ED  Discharge Orders     None

## 2023-02-25 ENCOUNTER — Ambulatory Visit (INDEPENDENT_AMBULATORY_CARE_PROVIDER_SITE_OTHER): Payer: Medicare HMO | Admitting: Family Medicine

## 2023-02-25 ENCOUNTER — Encounter: Payer: Self-pay | Admitting: Family Medicine

## 2023-02-25 VITALS — BP 124/53 | HR 69 | Ht 62.0 in | Wt 148.0 lb

## 2023-02-25 DIAGNOSIS — W19XXXA Unspecified fall, initial encounter: Secondary | ICD-10-CM | POA: Diagnosis not present

## 2023-02-25 DIAGNOSIS — Z09 Encounter for follow-up examination after completed treatment for conditions other than malignant neoplasm: Secondary | ICD-10-CM

## 2023-02-25 DIAGNOSIS — E871 Hypo-osmolality and hyponatremia: Secondary | ICD-10-CM | POA: Diagnosis not present

## 2023-02-25 NOTE — Progress Notes (Signed)
Acute Office Visit  Subjective:     Patient ID: Heather [name], female    DOB: [birth year], 88 y.o.   MRN: [id number]  Chief Complaint  Patient presents with   Hospitalization Follow-up    HPI Patient is in today for  hospital follow-up.   Discussed the use of AI scribe software for clinical note transcription with the patient, who gave verbal consent to proceed.  History of Present Illness   The patient, with a recent history of ventral hernia repair with mesh (02/11/23), presented for a hospital follow-up. Post-operatively, the patient experienced a fever and surgical soreness, but denied any chest pain. The patient was readmitted to the emergency room 02/13/23 two days post-surgery due to these symptoms. During this visit, a CTA of the chest, CT of the abdomen, chest x-ray, and ultrasound of the legs were performed, all of which were stable or negative. The patient was given fluids, pain medicine, and lab work was conducted before being sent home. Electrolytes were mildly off, but other labs were stable.  Subsequently, the patient experienced a fall on 02/23/23, resulting in a head injury that required vicryl sutures. The patient reported subsequent soreness in the left shoulder and knees, with the knees beginning to feel bruised. The patient also reported a swollen thumb following the fall, but denied any significant pain or limited range of motion. The patient did not lose consciousness during the fall, but the exact mechanism of injury remains unclear. States she was outside watering flowers and fell into the storm door, catching herself with her hands.   The patient's surgical incisions and forehead laceration were healing well, with no signs of infection.       ROS All review of systems negative except what is listed in the HPI      Objective:    BP (!) 124/53   Pulse 69   Ht 5\' 2"  (1.575 m)   Wt 148 lb (67.1 kg)   LMP  (LMP Unknown)   SpO2 98%   BMI 27.07 kg/m     Physical Exam Vitals reviewed.  Constitutional:      Appearance: Normal appearance.  Cardiovascular:     Rate and Rhythm: Normal rate and regular rhythm.     Heart sounds: Normal heart sounds.  Pulmonary:     Effort: Pulmonary effort is normal.     Breath sounds: Normal breath sounds.  Skin:    General: Skin is warm and dry.     Comments: Surgical incisions to abdomen healing nicely with bruising Left forehead laceration without signs of infection  Neurological:     Mental Status: She is alert and oriented to person, place, and time.  Psychiatric:        Mood and Affect: Mood normal.        Behavior: Behavior normal.        Thought Content: Thought content normal.        Judgment: Judgment normal.     No results found for any visits on 02/25/23.      Assessment & Plan:   Problem List Items Addressed This Visit   None Visit Diagnoses     Hyponatremia     Repeat labs Asymptomatic    Relevant Orders   Basic Metabolic Panel (BMET)   Hospital discharge follow-up       Fall, initial encounter       Postoperative follow-up     Incisions and laceration appear to be healing well. No signs of  infection. Repeat BMP today Due for post-op follow-up with surgeon in 2 weeks Patient aware of signs/symptoms requiring further/urgent evaluation.           No orders of the defined types were placed in this encounter.   Return if symptoms worsen or fail to improve.  Clayborne Dana, NP

## 2023-02-26 ENCOUNTER — Inpatient Hospital Stay: Payer: Medicare HMO | Admitting: Family Medicine

## 2023-02-26 LAB — BASIC METABOLIC PANEL
BUN: 17 mg/dL (ref 6–23)
CO2: 25 meq/L (ref 19–32)
Calcium: 9.1 mg/dL (ref 8.4–10.5)
Chloride: 102 mEq/L (ref 96–112)
Creatinine, Ser: 0.9 mg/dL (ref 0.40–1.20)
GFR: 57.94 mL/min — ABNORMAL LOW (ref >60.00)
Glucose, Bld: 139 mg/dL — ABNORMAL HIGH (ref 70–99)
Potassium: 4.1 meq/L (ref 3.5–5.1)
Sodium: 138 meq/L (ref 135–145)

## 2023-02-28 ENCOUNTER — Encounter: Payer: Self-pay | Admitting: Physician Assistant

## 2023-02-28 NOTE — Progress Notes (Signed)
Cardiology Office Note    Date:  03/04/2023  ID:  Rotonda, Stankovic Nov 24, 1934, MRN [id number] PCP:  Bradd Canary, MD  Cardiologist:  Tonny Bollman, MD  Electrophysiologist:  None   Chief Complaint: f/u CAD, CHF  History of Present Illness: .    Heather [name] is a 88 y.o. 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 Her prior anginal equivalent was jaw pain. Last cath was in 2006 with LM 20, LAD mid 70-80; Dx ost 70, OM stent patent, RCA mid 70-80, with PCI to RCA. She had a monitor in 2022 for evaluation of palpitations showing rare PVCs, rare SV beats; no sustained arrhythmia, nocturnal bradycardia. Last nuc 08/2020 was normal. Last echo 01/2021 EF 60-65%, indet diasotlic function, normal RV, trivial MR, aortic sclerosis without stenosis. She underwent hernia repair earlier this summer and had readmission early August for recurrent abdominal pain, fever, chills, malaise. Workup was reassuring. She also had ED visit 02/23/23 for mechanical fall. CT head nonacute, + generalized atrophy. She saw primary care NP in follow-up from that 02/25/23.  She is seen back for follow-up doing well from a cardiac standpoint. She denies any CP, SOB, palpitations, edema, dizziness, near-syncope or syncope. She confirms the fall was purely mechanical, no preceding cardiac symptoms. She reports some ongoing soreness to palpation on her head where she had fallen. She also feels her vision has worsened since the fall with some blurriness. It improves with using her glasses, but not completely at baseline. There are no specific neurologic features such as visual field defect/cut or amaurosis fugax.   Labwork independently reviewed: 02/2023 K 4.1, Cr 0.90, trops neg, BNP Wnl, alb 3.7, AST  ALT OK, Hgb 12, plt 149 03/2022 TSH wnl, LDL 57  ROS: .    Please see the history of present illness.  All other systems are reviewed and otherwise negative  Studies Reviewed: Marland Kitchen    EKG:  EKG is not ordered today but reviewed from 02/13/23 NSR 77bpm RBB+LAFB, nonspecific STTW changes similar to prior (including III)  CV Studies: Cardiac studies reviewed are outlined and summarized above. Otherwise please see EMR for full report.   Current Reported Medications:.    Current Meds  Medication Sig   acetaminophen (TYLENOL) 500 MG tablet Take 1,000 mg by mouth every 6 (six) hours as needed for moderate pain.   amLODipine (NORVASC) 10 MG tablet TAKE 1 TABLET EVERY DAY   aspirin EC 81 MG tablet Take 1 tablet (81 mg total) by mouth daily.   Blood Glucose Monitoring Suppl (TRUE METRIX AIR GLUCOSE METER) w/Device KIT USE TO CHECK BLOOD SUGAR ONCE DAILY AND AS NEEDED.  DX CODE E11.9   cyclobenzaprine (FLEXERIL) 5 MG tablet Take 1 tablet (5 mg total) by mouth 3 (three) times daily as needed for muscle spasms.   diclofenac Sodium (VOLTAREN) 1 % GEL Apply 1 application topically 4 (four) times daily as needed (hip pain).   estradiol (ESTRACE) 0.1 MG/GM vaginal cream Place 0.5 g vaginally 2 (two) times a week. Place 0.5g nightly for two weeks then twice a week after   glucose blood (TRUE METRIX BLOOD GLUCOSE TEST) test strip TEST BLOOD SUGAR EVERY DAY  OR AS NEEDED   Glycerin, PF, (OPTASE COMFORT DRY EYE) 1 % SOLN  Place 1 drop into both eyes in the morning and at bedtime.   isosorbide mononitrate (IMDUR) 60 MG 24 hr tablet TAKE 1 AND 1/2 TABLETS EVERY DAY   losartan (COZAAR) 100 MG tablet TAKE 1 TABLET EVERY DAY   Magnesium Oxide 250 MG TABS Take 250 mg by mouth every morning.   metoprolol succinate (TOPROL-XL) 25 MG 24 hr tablet Take 0.5 tablets (12.5 mg total) by mouth daily.   Olopatadine HCl (PATADAY) 0.2 % SOLN Place 1 drop into both eyes daily.   omeprazole (PRILOSEC) 20 MG capsule TAKE 1 CAPSULE  EVERY DAY AS NEEDED (NEED OFFICE VISIT BEFORE ANY FURTHER REFILLS)   pramipexole (MIRAPEX) 0.25 MG tablet TAKE 1 TABLET TWICE DAILY   simvastatin (ZOCOR) 20 MG tablet TAKE 1 TABLET AT BEDTIME   TRUEplus Lancets 33G MISC USE TO CHECK BLOOD SUGAR ONCE DAILY AND AS NEEDED.  DX CODE: E11.9    Physical Exam:    VS:  BP 130/60 (BP Location: Left Arm, Patient Position: Sitting, Cuff Size: Normal)   Pulse 80   Ht 5\' 2"  (1.575 m)   Wt 147 lb (66.7 kg)   LMP  (LMP Unknown)   SpO2 96%   BMI 26.89 kg/m    Wt Readings from Last 3 Encounters:  03/04/23 147 lb (66.7 kg)  02/25/23 148 lb (67.1 kg)  02/13/23 147 lb 11.3 oz (67 kg)    GEN: Well nourished, well developed in no acute distress NECK: No JVD; No carotid bruits CARDIAC: RRR, no murmurs, rubs, gallops RESPIRATORY:  Clear to auscultation without rales, wheezing or rhonchi  ABDOMEN: Soft, non-tender, non-distended EXTREMITIES:  No edema; No acute deformity   Asessement and Plan:.    1. CAD, HLD - doing well without recent angina. Continue present regimen without acute change. Continue ASA, Imdur, losartan, amlodipine, metoprolol. She has been maintained on simvastatin 20mg  daily and has done well on this regimen so I would continue. She will be due for fasting lipids 03/2023. Discussed obtaining in PCP vs our office and she would like [place] to order.  2. Chronic HFpEF - appears euvolemic, not requiring any standing loop diuretic therapy at this time.  3. HTN - continue current medications. BP acceptable for age and h/o falls.  4. RBBB+LAFB - no bradycardia seen. HR normal during encounters 8/7, 8/17, 8/19, and today. No recent symptoms of dizziness, pre-syncope or syncope. Continue to follow at each OV. If falling recurs, could consider monitor, but the features of her recent fall were more mechanical in nature.  5. Fall - saw PCP NP back in follow-up 8/19. She reports they discussed the feeling of head numbness at that visit but she's had  some residual soreness to palpation on her head as well as some increase in blurred vision that is improved with using her glasses. There are no acute focal neurologic features to her visual changes. She had not mentioned this to primary care when she saw them. Family wonders about concussion. I recommended they reach back out to PCP to discuss these complaints. She otherwise is not exhibiting any focal neurologic deficits today.    Disposition: F/u with Dr. Excell Seltzer in 6 months.  Signed, Laurann Montana, PA-C

## 2023-03-01 ENCOUNTER — Telehealth: Payer: Self-pay | Admitting: *Deleted

## 2023-03-01 NOTE — Progress Notes (Signed)
  Care Coordination   Note   03/01/2023 Name: Heather [name] MRN: [id number] DOB: 1934/09/09  Heather [name] is a 88 y.o. year old female who sees Bradd Canary, MD for primary care. I reached out to [name] by phone today to offer care coordination services.  [name] was given information about Care Coordination services today including:   The Care Coordination services include support from the care team which includes your Nurse Coordinator, Clinical Social Worker, or Pharmacist.  The Care Coordination team is here to help remove barriers to the health concerns and goals most important to you. Care Coordination services are voluntary, and the patient may decline or stop services at any time by request to their care team member.   Care Coordination Consent Status: Patient agreed to services and verbal consent obtained.   Follow up plan:  Telephone appointment with care coordination team member scheduled for:  03/06/23  Encounter Outcome:  Pt. Scheduled  [facility] Coordination Care Guide  Direct Dial: [phone]

## 2023-03-03 ENCOUNTER — Other Ambulatory Visit: Payer: Self-pay | Admitting: Cardiovascular Disease

## 2023-03-04 ENCOUNTER — Ambulatory Visit: Payer: Medicare HMO | Attending: Physician Assistant | Admitting: Physician Assistant

## 2023-03-04 ENCOUNTER — Encounter: Payer: Self-pay | Admitting: Physician Assistant

## 2023-03-04 VITALS — BP 130/60 | HR 80 | Ht 62.0 in | Wt 147.0 lb

## 2023-03-04 DIAGNOSIS — I10 Essential (primary) hypertension: Secondary | ICD-10-CM | POA: Diagnosis not present

## 2023-03-04 DIAGNOSIS — E785 Hyperlipidemia, unspecified: Secondary | ICD-10-CM

## 2023-03-04 DIAGNOSIS — I452 Bifascicular block: Secondary | ICD-10-CM

## 2023-03-04 DIAGNOSIS — W19XXXD Unspecified fall, subsequent encounter: Secondary | ICD-10-CM

## 2023-03-04 DIAGNOSIS — I2510 Atherosclerotic heart disease of native coronary artery without angina pectoris: Secondary | ICD-10-CM

## 2023-03-04 DIAGNOSIS — I5032 Chronic diastolic (congestive) heart failure: Secondary | ICD-10-CM | POA: Diagnosis not present

## 2023-03-04 NOTE — Patient Instructions (Signed)
Medication Instructions:  Your physician recommends that you continue on your current medications as directed. Please refer to the Current Medication list given to you today.  *If you need a refill on your cardiac medications before your next appointment, please call your pharmacy*   Lab Work: April 04, 2023: fasting Lipid panel (come to lab any time between 7:30am and 4:00pm) If you have labs (blood work) drawn today and your tests are completely normal, you will receive your results only by: MyChart Message (if you have MyChart) OR A paper copy in the mail If you have any lab test that is abnormal or we need to change your treatment, we will call you to review the results.  Testing/Procedures: None ordered today.  Follow-Up: At [facility], you and your health needs are our priority.  As part of our continuing mission to provide you with exceptional heart care, we have created designated Provider Care Teams.  These Care Teams include your primary Cardiologist (physician) and Advanced Practice Providers (APPs -  Physician Assistants and Nurse Practitioners) who all work together to provide you with the care you need, when you need it.  Your next appointment:   6 month(s)  The format for your next appointment:   In Person  Provider:   Tonny Bollman, MD {

## 2023-03-05 DIAGNOSIS — C44722 Squamous cell carcinoma of skin of right lower limb, including hip: Secondary | ICD-10-CM | POA: Diagnosis not present

## 2023-03-05 DIAGNOSIS — I872 Venous insufficiency (chronic) (peripheral): Secondary | ICD-10-CM | POA: Diagnosis not present

## 2023-03-06 ENCOUNTER — Ambulatory Visit: Payer: Self-pay | Admitting: Licensed Clinical Social Worker

## 2023-03-06 NOTE — Patient Instructions (Signed)
Visit Information  Thank you for taking time to visit with me today. Please don't hesitate to contact me if I can be of assistance to you.   Following are the goals we discussed today:   Goals Addressed             This Visit's Progress    Patient Stated she had recent procedure on her right leg (skin procedure)       Interventions:  Spoke with client about client needs Discussed program support with RN, LCSW, Pharmacist Discussed client recent procedure on her right leg (skin procedure) Discussed food procurement. She has no food issues at present Discussed transportation issues. She has transport help as needed Discussed energy level. She has decreased energy at present Discussed family support with daughter and son Client said she has another appointment in 2 [facility] for another [facility] treatment on right leg Discussed relaxation activities.  She likes to read.  She said it is harder to concentrate recently Discussed support with cardiologist. Discussed hearing of client . Discussed vision of client Discussed Life Alert system options. She plans to call her insurance company to see if insurance provider has company they work with for [employer] system support  Provided counseling support Discussed mood issues. She said she was doing well with her mood. She has support from daughter , [name] Encouraged client to call LCSW as needed for SW support at [phone].           Our next appointment is by telephone on 04/08/23 at 3:30 PM   Please call the care guide team at [phone] if you need to cancel or reschedule your appointment.   If you are experiencing a Mental Health or Behavioral Health Crisis or need someone to talk to, please go to [facility] [address] [place] [phone])   The patient verbalized understanding of instructions, educational materials, and care plan provided today and DECLINED offer to  receive copy of patient instructions, educational materials, and care plan.   The patient has been provided with contact information for the care management team and has been advised to call with any health related questions or concerns.   Kelton Pillar.Carmie Lanpher MSW, LCSW Licensed Visual merchandiser [facility] Care Management [phone]

## 2023-03-06 NOTE — Patient Outreach (Signed)
  Care Coordination   Follow Up Visit Note   03/06/2023 Name: Heather [name] MRN: [id number] DOB: 1934/08/07  Heather [name] is a 88 y.o. year old female who sees Bradd Canary, MD for primary care. I spoke with  [name] by phone today.  What matters to the patients health and wellness today?  Patient had a recent procedure on her right leg    Goals Addressed             This Visit's Progress    Patient Stated she had recent procedure on her right leg (skin procedure)       Interventions:  Spoke with client about client needs Discussed program support with RN, LCSW, Pharmacist Discussed client recent procedure on her right leg (skin procedure) Discussed food procurement. She has no food issues at present Discussed transportation issues. She has transport help as needed Discussed energy level. She has decreased energy at present Discussed family support with daughter and son Client said she has another appointment in 2 weeks at [facility] for another skin treatment on right leg Discussed relaxation activities.  She likes to read.  She said it is harder to concentrate recently Discussed support with cardiologist. Discussed hearing of client . Discussed vision of client Discussed Life Alert system options. She plans to call her insurance company to see if insurance provider has company they work with for [employer] system support  Provided counseling support Discussed mood issues. She said she was doing well with her mood. She has support from daughter , [name] Encouraged client to call LCSW as needed for SW support at [phone].           SDOH assessments and interventions completed:  Yes  SDOH Interventions Today    Flowsheet Row Most Recent Value  SDOH Interventions   Depression Interventions/Treatment  Counseling  Physical Activity Interventions Other (Comments)  [decreased mobility.]  Stress Interventions Provide Counseling  [has stress in managing  medical needs]        Care Coordination Interventions:  Yes, provided   Interventions Today    Flowsheet Row Most Recent Value  Chronic Disease   Chronic disease during today's visit Other  [discussed client needs with client]  General Interventions   General Interventions Discussed/Reviewed General Interventions Discussed, Community Resources  Exercise Interventions   Exercise Discussed/Reviewed Physical Activity  Education Interventions   Education Provided Provided Education  Provided Surveyor, minerals  Mental Health Interventions   Mental Health Discussed/Reviewed Coping Strategies, Anxiety  [discussed coping skills. discussed mood issues. Client said she is doing ok now with her mood. Has support of daughter, [name]]  Nutrition Interventions   Nutrition Discussed/Reviewed Nutrition Discussed  Pharmacy Interventions   Pharmacy Dicussed/Reviewed Pharmacy Topics Discussed  Safety Interventions   Safety Discussed/Reviewed Fall Risk        Follow up plan: Follow up call scheduled for 04/08/23 at 3:30 PM     Encounter Outcome:  Pt. Visit Completed   Kelton Pillar.Kester Stimpson MSW, LCSW Licensed Visual merchandiser [facility] Care Management [phone]

## 2023-03-07 ENCOUNTER — Other Ambulatory Visit: Payer: Self-pay | Admitting: Obstetrics and Gynecology

## 2023-03-07 DIAGNOSIS — N390 Urinary tract infection, site not specified: Secondary | ICD-10-CM

## 2023-03-07 MED ORDER — SULFAMETHOXAZOLE-TRIMETHOPRIM 800-160 MG PO TABS
1.0000 | ORAL_TABLET | Freq: Two times a day (BID) | ORAL | 0 refills | Status: AC
Start: 2023-03-07 — End: 2023-03-10

## 2023-03-07 NOTE — Progress Notes (Signed)
Patient is unable to come to the office and give a urine sample as she reportedly just had surgery but is having symptoms of a UTI. She is requesting we send in an antibiotic for her as she is not very mobile at this time.    Will send in Bactrim for patient but if her symptoms persist she will need to come give a urine sample for evaluation.

## 2023-03-08 ENCOUNTER — Emergency Department (HOSPITAL_COMMUNITY): Payer: Medicare HMO

## 2023-03-08 ENCOUNTER — Emergency Department (HOSPITAL_COMMUNITY)
Admission: EM | Admit: 2023-03-08 | Discharge: 2023-03-08 | Disposition: A | Discharge status: Home / Self Care | Payer: Medicare HMO | Attending: Emergency Medicine | Admitting: Emergency Medicine

## 2023-03-08 ENCOUNTER — Other Ambulatory Visit: Payer: Self-pay

## 2023-03-08 ENCOUNTER — Encounter (HOSPITAL_COMMUNITY): Payer: Self-pay

## 2023-03-08 DIAGNOSIS — K449 Diaphragmatic hernia without obstruction or gangrene: Secondary | ICD-10-CM | POA: Diagnosis not present

## 2023-03-08 DIAGNOSIS — R300 Dysuria: Secondary | ICD-10-CM | POA: Diagnosis not present

## 2023-03-08 DIAGNOSIS — R1013 Epigastric pain: Secondary | ICD-10-CM | POA: Insufficient documentation

## 2023-03-08 DIAGNOSIS — Z7982 Long term (current) use of aspirin: Secondary | ICD-10-CM | POA: Diagnosis not present

## 2023-03-08 DIAGNOSIS — K5730 Diverticulosis of large intestine without perforation or abscess without bleeding: Secondary | ICD-10-CM | POA: Diagnosis not present

## 2023-03-08 DIAGNOSIS — Q625 Duplication of ureter: Secondary | ICD-10-CM | POA: Diagnosis not present

## 2023-03-08 DIAGNOSIS — R112 Nausea with vomiting, unspecified: Secondary | ICD-10-CM | POA: Insufficient documentation

## 2023-03-08 DIAGNOSIS — R109 Unspecified abdominal pain: Secondary | ICD-10-CM | POA: Diagnosis not present

## 2023-03-08 LAB — COMPREHENSIVE METABOLIC PANEL
ALT: 37 U/L (ref 0–44)
AST: 87 U/L — ABNORMAL HIGH (ref 15–41)
Albumin: 3.8 g/dL (ref 3.5–5.0)
Alkaline Phosphatase: 108 U/L (ref 38–126)
Anion gap: 8 (ref 5–15)
BUN: 25 mg/dL — ABNORMAL HIGH (ref 8–23)
CO2: 25 mmol/L (ref 22–32)
Calcium: 8.9 mg/dL (ref 8.9–10.3)
Chloride: 102 mmol/L (ref 98–111)
Creatinine, Ser: 1.02 mg/dL — ABNORMAL HIGH (ref 0.44–1.00)
GFR, Estimated: 54 mL/min — ABNORMAL LOW (ref >60)
Glucose, Bld: 160 mg/dL — ABNORMAL HIGH (ref 70–99)
Potassium: 4 mmol/L (ref 3.5–5.1)
Sodium: 135 mmol/L (ref 135–145)
Total Bilirubin: 0.7 mg/dL (ref 0.3–1.2)
Total Protein: 6.6 g/dL (ref 6.5–8.1)

## 2023-03-08 LAB — URINALYSIS, W/ REFLEX TO CULTURE (INFECTION SUSPECTED)
Bacteria, UA: NONE SEEN
Bilirubin Urine: NEGATIVE
Glucose, UA: NEGATIVE mg/dL
Hgb urine dipstick: NEGATIVE
Ketones, ur: NEGATIVE mg/dL
Nitrite: NEGATIVE
Protein, ur: NEGATIVE mg/dL
Specific Gravity, Urine: 1.016 (ref 1.005–1.030)
pH: 5 (ref 5.0–8.0)

## 2023-03-08 LAB — CBC WITH DIFFERENTIAL/PLATELET
Abs Immature Granulocytes: 0.03 10*3/uL (ref 0.00–0.07)
Basophils Absolute: 0 10*3/uL (ref 0.0–0.1)
Basophils Relative: 0 %
Eosinophils Absolute: 0.4 10*3/uL (ref 0.0–0.5)
Eosinophils Relative: 5 %
HCT: 36.9 % (ref 36.0–46.0)
Hemoglobin: 12.1 g/dL (ref 12.0–15.0)
Immature Granulocytes: 0 %
Lymphocytes Relative: 13 %
Lymphs Abs: 1.2 10*3/uL (ref 0.7–4.0)
MCH: 30.9 pg (ref 26.0–34.0)
MCHC: 32.8 g/dL (ref 30.0–36.0)
MCV: 94.4 fL (ref 80.0–100.0)
Monocytes Absolute: 0.7 10*3/uL (ref 0.1–1.0)
Monocytes Relative: 7 %
Neutro Abs: 6.6 10*3/uL (ref 1.7–7.7)
Neutrophils Relative %: 75 %
Platelets: 186 10*3/uL (ref 150–400)
RBC: 3.91 MIL/uL (ref 3.87–5.11)
RDW: 12.9 % (ref 11.5–15.5)
WBC: 8.9 10*3/uL (ref 4.0–10.5)
nRBC: 0 % (ref 0.0–0.2)

## 2023-03-08 LAB — TROPONIN I (HIGH SENSITIVITY)
Troponin I (High Sensitivity): 12 ng/L (ref <18)
Troponin I (High Sensitivity): 9 ng/L (ref <18)

## 2023-03-08 MED ORDER — ONDANSETRON HCL 4 MG/2ML IJ SOLN
4.0000 mg | Freq: Once | INTRAMUSCULAR | Status: AC
  Administered 2023-03-08: 4 mg via INTRAVENOUS
  Filled 2023-03-08: qty 2

## 2023-03-08 MED ORDER — IOHEXOL 300 MG/ML  SOLN
100.0000 mL | Freq: Once | INTRAMUSCULAR | Status: AC | PRN
  Administered 2023-03-08: 100 mL via INTRAVENOUS

## 2023-03-08 MED ORDER — SODIUM CHLORIDE 0.9 % IV BOLUS
500.0000 mL | Freq: Once | INTRAVENOUS | Status: AC
  Administered 2023-03-08: 500 mL via INTRAVENOUS

## 2023-03-08 NOTE — ED Triage Notes (Signed)
Pt had hernia surgery on the the 5th, today pt presenting with upper abd pain. Pt does endorse nausea following lunch, pain with urination, and foul odor of urine. 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

## 2023-03-08 NOTE — ED Provider Notes (Signed)
[place] EMERGENCY DEPARTMENT AT [facility] Provider Note   CSN: [id number] Arrival date & time: 03/08/23  1451     History {Add pertinent medical, surgical, social history, OB history to HPI:1} Chief Complaint  Patient presents with   Abdominal Pain    Heather [name] is a 88 y.o. female.  She underwent a laparoscopic assisted incisional hernia repair with mesh back in early August by Dr. Ruthell Rummage.  She was back in the ER a week later with a fall.  She just had a lesion excised from her right lower leg.  Today after eating lunch she experienced a severe midepigastric abdominal pain associated with nausea and vomiting.  She said it was severe and lasted about 45 minutes before it started to recede.  She said it still about 2 out of 10.  She called the general surgery clinic who recommended she come to the emergency department.  She has not had a bowel movement today but she said she did yesterday although she feels she has been stooling less since surgery.  Had a low-grade fever and has had some urinary symptoms, family reached out to her urogynecologist and started on Bactrim today.  The history is provided by the patient.  Abdominal Pain Pain location:  Epigastric Pain severity:  Severe Onset quality:  Sudden Duration:  1 hour Timing:  Constant Progression:  Partially resolved Chronicity:  New Context: not trauma   Relieved by:  Nothing Worsened by:  Nothing Ineffective treatments:  Vomiting Associated symptoms: dysuria, fever, nausea and vomiting   Associated symptoms: no chest pain, no cough, no diarrhea and no hematemesis        Home Medications Prior to Admission medications   Medication Sig Start Date End Date Taking? Authorizing Provider  acetaminophen (TYLENOL) 500 MG tablet Take 1,000 mg by mouth every 6 (six) hours as needed for moderate pain.    [provider]  amLODipine (NORVASC) 10 MG tablet TAKE 1 TABLET EVERY DAY 11/19/22   Bradd Canary, MD  aspirin EC 81 MG tablet Take 1 tablet (81 mg total) by mouth daily. 05/04/15   Tonny Bollman, MD  Blood Glucose Monitoring Suppl (TRUE METRIX AIR GLUCOSE METER) w/Device KIT USE TO CHECK BLOOD SUGAR ONCE DAILY AND AS NEEDED.  DX CODE E11.9 07/27/20   Bradd Canary, MD  cyclobenzaprine (FLEXERIL) 5 MG tablet Take 1 tablet (5 mg total) by mouth 3 (three) times daily as needed for muscle spasms. 02/13/23   Axel Filler, MD  diclofenac Sodium (VOLTAREN) 1 % GEL Apply 1 application topically 4 (four) times daily as needed (hip pain).    [provider]  estradiol (ESTRACE) 0.1 MG/GM vaginal cream Place 0.5 g vaginally 2 (two) times a week. Place 0.5g nightly for two weeks then twice a week after 09/25/21   Marguerita Beards, MD  glucose blood (TRUE METRIX BLOOD GLUCOSE TEST) test strip TEST BLOOD SUGAR EVERY DAY  OR AS NEEDED 09/21/21   Bradd Canary, MD  Glycerin, PF, (OPTASE COMFORT DRY EYE) 1 % SOLN Place 1 drop into both eyes in the morning and at bedtime.    [provider]  isosorbide mononitrate (IMDUR) 60 MG 24 hr tablet TAKE 1 AND 1/2 TABLETS EVERY DAY 03/05/23   Tonny Bollman, MD  losartan (COZAAR) 100 MG tablet TAKE 1 TABLET EVERY DAY 01/07/23   Tonny Bollman, MD  Magnesium Oxide 250 MG TABS Take 250 mg by mouth every morning.  [provider]  metoprolol succinate (TOPROL-XL) 25 MG 24 hr tablet Take 0.5 tablets (12.5 mg total) by mouth daily. 02/01/22   Bradd Canary, MD  Olopatadine HCl (PATADAY) 0.2 % SOLN Place 1 drop into both eyes daily.    [provider]  omeprazole (PRILOSEC) 20 MG capsule TAKE 1 CAPSULE EVERY DAY AS NEEDED (NEED OFFICE VISIT BEFORE ANY FURTHER REFILLS) 07/11/22   Bradd Canary, MD  pramipexole (MIRAPEX) 0.25 MG tablet TAKE 1 TABLET TWICE DAILY 10/08/22   Bradd Canary, MD  simvastatin (ZOCOR) 20 MG tablet TAKE 1 TABLET AT BEDTIME 02/01/23   Tonny Bollman, MD  sulfamethoxazole-trimethoprim (BACTRIM DS) 800-160 MG  tablet Take 1 tablet by mouth 2 (two) times daily for 3 days. 03/07/23 03/10/23  Selmer Dominion, NP  TRUEplus Lancets 33G MISC USE TO CHECK BLOOD SUGAR ONCE DAILY AND AS NEEDED.  DX CODE: E11.9 07/27/20   Bradd Canary, MD      Allergies    Baclofen, Gabapentin, and Nitrofurantoin    Review of Systems   Review of Systems  Constitutional:  Positive for fever.  Respiratory:  Negative for cough.   Cardiovascular:  Negative for chest pain.  Gastrointestinal:  Positive for abdominal pain, nausea and vomiting. Negative for diarrhea and hematemesis.  Genitourinary:  Positive for dysuria.    Physical Exam Updated Vital Signs BP (!) 129/56 (BP Location: Right Arm)   Pulse 94   Temp 98.4 F (36.9 C) (Oral)   Resp 18   Ht 5\' 2"  (1.575 m)   Wt 66.7 kg   LMP  (LMP Unknown)   SpO2 97%   BMI 26.89 kg/m  Physical Exam Vitals and nursing note reviewed.  Constitutional:      General: She is not in acute distress.    Appearance: She is well-developed.  HENT:     Head: Normocephalic and atraumatic.  Eyes:     Conjunctiva/sclera: Conjunctivae normal.  Cardiovascular:     Rate and Rhythm: Normal rate and regular rhythm.     Heart sounds: No murmur heard. Pulmonary:     Effort: Pulmonary effort is normal. No respiratory distress.     Breath sounds: Normal breath sounds.  Abdominal:     Palpations: Abdomen is soft.     Tenderness: There is abdominal tenderness (She has a midline ventral incision with some tenderness.  No significant erythema, no dehiscence.) in the epigastric area.  Musculoskeletal:        General: Tenderness present.     Cervical back: Neck supple.     Comments: She has a wrap on her right leg where she had a mass excised.  Skin:    General: Skin is warm and dry.     Capillary Refill: Capillary refill takes less than 2 seconds.  Neurological:     General: No focal deficit present.     Mental Status: She is alert.     ED Results / Procedures / Treatments    Labs (all labs ordered are listed, but only abnormal results are displayed) Labs Reviewed  COMPREHENSIVE METABOLIC PANEL - Abnormal; Notable for the following components:      Result Value   Glucose, Bld 160 (*)    BUN 25 (*)    Creatinine, Ser 1.02 (*)    AST 87 (*)    GFR, Estimated 54 (*)    All other components within normal limits  CBC WITH DIFFERENTIAL/PLATELET  TROPONIN I (HIGH SENSITIVITY)    EKG EKG  Interpretation Date/Time:  Friday March 08 2023 17:06:27 EDT Ventricular Rate:  73 PR Interval:  178 QRS Duration:  137 QT Interval:  405 QTC Calculation: 447 R Axis:   -51  Text Interpretation: Sinus or ectopic atrial rhythm RBBB and LAFB Left ventricular hypertrophy ST elevation, consider inferior injury No significant change since prior 8/24 Confirmed by Meridee Score [phone]) on 03/08/2023 5:07:55 PM  Radiology No results found.  Procedures Procedures  {Document cardiac monitor, telemetry assessment procedure when appropriate:1}  Medications Ordered in ED Medications  sodium chloride 0.9 % bolus 500 mL (has no administration in time range)  ondansetron (ZOFRAN) injection 4 mg (has no administration in time range)    ED Course/ Medical Decision Making/ A&P   {   Click here for ABCD2, HEART and other calculatorsREFRESH Note before signing :1}                              Medical Decision Making Risk Prescription drug management.   This patient complains of ***; this involves an extensive number of treatment Options and is a complaint that carries with it a high risk of complications and morbidity. The differential includes ***  I ordered, reviewed and interpreted labs, which included *** I ordered medication *** and reviewed PMP when indicated. I ordered imaging studies which included *** and I independently    visualized and interpreted imaging which showed *** Additional history obtained from *** Previous records obtained and reviewed *** I  consulted *** and discussed lab and imaging findings and discussed disposition.  Cardiac monitoring reviewed, *** Social determinants considered, *** Critical Interventions: ***  After the interventions stated above, I reevaluated the patient and found *** Admission and further testing considered, ***   {Document critical care time when appropriate:1} {Document review of labs and clinical decision tools ie heart score, Chads2Vasc2 etc:1}  {Document your independent review of radiology images, and any outside records:1} {Document your discussion with family members, caretakers, and with consultants:1} {Document social determinants of health affecting pt's care:1} {Document your decision making why or why not admission, treatments were needed:1} Final Clinical Impression(s) / ED Diagnoses Final diagnoses:  None    Rx / DC Orders ED Discharge Orders     None

## 2023-03-08 NOTE — ED Provider Triage Note (Signed)
Emergency Medicine Provider Triage Evaluation Note  Heather [name] , a 88 y.o. female  was evaluated in triage.  Pt complains of stomach pain that started after she ate lunch today. Pain is over her incision for hernia repair that occurred earlier this month. Had vomiting associated with onset of pain. Feeling short of breath.  Review of Systems  Positive: As above Negative: As above  Physical Exam  BP (!) 129/56 (BP Location: Right Arm)   Pulse 94   Temp 98.4 F (36.9 C) (Oral)   Resp 18   Ht 5\' 2"  (1.575 m)   Wt 66.7 kg   LMP  (LMP Unknown)   SpO2 97%   BMI 26.89 kg/m  Gen:   Awake, no distress. No active vomiting Resp:  Normal effort  MSK:   Moves extremities without difficulty  Other:  Incision of the epigastric area, feel a lump but no strangulated/incarcerated hernia  Medical Decision Making  Medically screening exam initiated at 3:38 PM.  Appropriate orders placed.  Heather [name] was informed that the remainder of the evaluation will be completed by another provider, this initial triage assessment does not replace that evaluation, and the importance of remaining in the ED until their evaluation is complete.     Arabella Merles, PA-C 03/08/23 1544

## 2023-03-08 NOTE — Discharge Instructions (Signed)
You were seen in the emergency department for an acute episode of abdominal pain.  You had lab work and a CAT scan that did not show an obvious explanation for your symptoms.  Please start with a clear liquid diet advance as tolerated.  Follow-up with the surgery clinic next week.  Return to the emergency department if any worsening or concerning symptoms.

## 2023-03-08 NOTE — ED Notes (Signed)
Pt returning from CT

## 2023-03-14 ENCOUNTER — Ambulatory Visit: Payer: Self-pay

## 2023-03-14 DIAGNOSIS — I872 Venous insufficiency (chronic) (peripheral): Secondary | ICD-10-CM | POA: Diagnosis not present

## 2023-03-14 NOTE — Patient Outreach (Signed)
  Care Coordination   Initial Visit Note    03/14/2023 Name: Heather [name] MRN: [id number] DOB: Nov 15, 1934  Heather [name] is a 88 y.o. year old female who sees Heather Canary, MD for primary care. I spoke with  Heather [name] by phone today.  What matters to the patients health and wellness today?  Heather [name] reports she is on her way to the "skin cancer doctor" regarding an area she is concerned about on her leg. She states her daughter is there to pick her up.  Goals Addressed             This Visit's Progress    Care Coordination Activities       Interventions Today    Flowsheet Row Most Recent Value  General Interventions   General Interventions Discussed/Reviewed General Interventions Discussed  [Request to care guide to schedule telephone appointment]            SDOH assessments and interventions completed:  No  Care Coordination Interventions:  Yes, provided   Follow up plan:  Referral to care guide to schedule another telephone call.    Encounter Outcome:  Patient Visit Completed   Heather Sheriff, RN, MSN, BSN, CCM Care Management Coordinator [phone]

## 2023-03-15 ENCOUNTER — Telehealth: Payer: Self-pay

## 2023-03-15 NOTE — Transitions of Care (Post Inpatient/ED Visit) (Signed)
   03/15/2023  Name: Heather [name] MRN: [id number] DOB: 1934-11-07  Today's TOC FU Call Status: Today's TOC FU Call Status:: Unsuccessful Call (1st Attempt) Unsuccessful Call (1st Attempt) Date: 03/15/23  Attempted to reach the patient regarding the most recent Inpatient/ED visit.  Follow Up Plan: Additional outreach attempts will be made to reach the patient to complete the Transitions of Care (Post Inpatient/ED visit) call.     Antionette Fairy, RN,BSN,CCM [facility] Care Management Care Management Community Coordinator Direct Phone: [phone] Toll Free: [phone] Fax: [phone]

## 2023-03-15 NOTE — Transitions of Care (Post Inpatient/ED Visit) (Signed)
   03/15/2023  Name: Heather [name] MRN: [id number] DOB: 11/03/1934  Today's TOC FU Call Status: Today's TOC FU Call Status:: Successful TOC FU Call Completed TOC FU Call Complete Date: 03/15/23 (Incoming call from pt returning RN CM call) Patient's Name and Date of Birth confirmed.   Red on EMMI-ED Discharge Alert Date & Reason:03/10/23 "Scheduled follow-up appt? No"  Transition Care Management Follow-up Telephone Call Date of Discharge: 03/08/23 Discharge Facility: [facility]) Type of Discharge: Emergency Department Reason for ED Visit: Other: (epigastric abd pain) How have you been since you were released from the hospital?: Better (P states she is not having any more abd pain but appetite remains decreased-her main concern is mroe about her wound on her leg-she went to MD appt yest and drsg was changed) Any questions or concerns?: No  Items Reviewed: Did you receive and understand the discharge instructions provided?: Yes Medications obtained,verified, and reconciled?: No Medications Not Reviewed Reasons:: Other: (pt still in bed-sleepy) Any new allergies since your discharge?: No Dietary orders reviewed?: NA Do you have support at home?: Yes People in Home: child(ren), adult Name of Support/Comfort Primary Source: daughter-[name]  Medications Reviewed Today: Medications Reviewed Today   Medications were not reviewed in this encounter     Home Care and Equipment/Supplies: Were Home Health Services Ordered?: NA Any new equipment or medical supplies ordered?: NA  Functional Questionnaire: Do you need assistance with bathing/showering or dressing?: No Do you need assistance with meal preparation?: No Do you need assistance with eating?: No Do you have difficulty maintaining continence: No Do you need assistance with getting out of bed/getting out of a chair/moving?: No Do you have difficulty managing or taking your medications?: No  Follow up appointments  reviewed: PCP Follow-up appointment confirmed?: No (P states she is seeing several specialists right now-will make PCP later) MD Provider Line Number:[phone] Given: No Specialist Hospital Follow-up appointment confirmed?: Yes Date of Specialist follow-up appointment?: 03/19/23 Follow-Up Specialty Provider:: Dr. Oletta Lamas Do you need transportation to your follow-up appointment?: No (pt confirms daughter takes her to appts) Do you understand care options if your condition(s) worsen?: Yes-patient verbalized understanding  SDOH Interventions Today    Flowsheet Row Most Recent Value  SDOH Interventions   Food Insecurity Interventions Intervention Not Indicated  Transportation Interventions Intervention Not Indicated      TOC Interventions Today    Flowsheet Row Most Recent Value  TOC Interventions   TOC Interventions Discussed/Reviewed TOC Interventions Discussed, S/S of infection      Interventions Today    Flowsheet Row Most Recent Value  General Interventions   General Interventions Discussed/Reviewed General Interventions Discussed, Doctor Visits, Referral to Nurse  [follow up appt made with assigned nurse]  Doctor Visits Discussed/Reviewed Doctor Visits Discussed, PCP, Specialist  PCP/Specialist Visits Compliance with follow-up visit  Education Interventions   Education Provided Provided Education  Provided Verbal Education On When to see the doctor, Other, Nutrition  Nutrition Interventions   Nutrition Discussed/Reviewed Nutrition Discussed, Fluid intake, Supplemental nutrition  Pharmacy Interventions   Pharmacy Dicussed/Reviewed Pharmacy Topics Discussed  Safety Interventions   Safety Discussed/Reviewed Safety Discussed, Home Safety       Alessandra Grout [facility] Care Management Care Management Community Coordinator Direct Phone: [phone] Toll Free: [phone] Fax: [phone]

## 2023-03-18 DIAGNOSIS — Z8719 Personal history of other diseases of the digestive system: Secondary | ICD-10-CM | POA: Diagnosis not present

## 2023-03-18 DIAGNOSIS — Z9889 Other specified postprocedural states: Secondary | ICD-10-CM | POA: Diagnosis not present

## 2023-03-21 ENCOUNTER — Ambulatory Visit: Payer: Self-pay

## 2023-03-21 ENCOUNTER — Other Ambulatory Visit: Payer: Self-pay | Admitting: Family Medicine

## 2023-03-21 DIAGNOSIS — S81811D Laceration without foreign body, right lower leg, subsequent encounter: Secondary | ICD-10-CM | POA: Diagnosis not present

## 2023-03-21 NOTE — Patient Outreach (Signed)
  Care Coordination   Initial Visit Note   03/21/2023 Name: Heather [name] MRN: [id number] DOB: 02-Mar-1935  Heather [name] is a 88 y.o. year old female who sees Bradd Canary, MD for primary care. I spoke with  Heather [name] by phone today.  What matters to the patients health and wellness today?  Heather [name] reports she is healed post ventral hernia repair and has been released by the surgeon. She denies abdominal pain at this time. Concern today is regarding cancerous area on right leg. She reports area was biopsied and removed by Dr. Jeannine Boga. She states a follow up appointment scheduled for later today.  She states another area on the left side of the same leg has an area that has been biopsied and needs to be removed. Per patient scheduled to be removed on 04/08/23. She reports she has supportive daughters that provides transportation; assist with meals and assist as needed. Heather [name] reports daughters alternate staying with her. She reports an ED visit following a fall on 02/23/23 where she hit her head-follow up visit completed on 02/25/23. But expresses concern that she is now feeling numbness now across the crown of her forehead. Last visit with PCP completed 03/22/22-She states she will call to schedule visit with primary care provider.  Goals Addressed             This Visit's Progress    Care Coordination Activities       Interventions Today    Flowsheet Row Most Recent Value  Chronic Disease   Chronic disease during today's visit Other  [8/5-8/7 s/p ventral hernia surgery,  02/13/23 ED visits fever,  02/23/23 laceration forehead/fall,  03/08/23 epigastric abdominal pain]  General Interventions   General Interventions Discussed/Reviewed General Interventions Discussed  Doctor Visits Discussed/Reviewed Doctor Visits Discussed, PCP  PCP/Specialist Visits Compliance with follow-up visit  [reviewed upcoming/scheduled appointments]  Education Interventions   Education Provided Provided  Education  Provided Verbal Education On When to see the doctor, Medication  [advised to take medications as prescribed, attend provider visits as scheduled, advised to contact provider with health questions/concerns]  Nutrition Interventions   Nutrition Discussed/Reviewed Nutrition Discussed  [discussed importance of nutrition in wound healing, encouraged continue to eat small frequent meals if needed and increase protein]  Pharmacy Interventions   Pharmacy Dicussed/Reviewed Pharmacy Topics Discussed  [medication review completed]  Safety Interventions   Safety Discussed/Reviewed Fall Risk, Safety Discussed            SDOH assessments and interventions completed:  Yes  Care Coordination Interventions:  Yes, provided   Follow up plan: Follow up call scheduled for 04/11/23    Encounter Outcome:  Patient Visit Completed   Kathyrn Sheriff, RN, MSN, BSN, CCM Care Management Coordinator [phone]

## 2023-03-21 NOTE — Patient Instructions (Signed)
Visit Information  Thank you for taking time to visit with me today. Please don't hesitate to contact me if I can be of assistance to you.   Following are the goals we discussed today:  Continue to take medications as prescribed. Continue to attend provider visits as scheduled Contact provider with any health questions or concerns  Our next appointment is by telephone on 04/15/23 at 10:30 am  Please call the care guide team at [phone] if you need to cancel or reschedule your appointment.   If you are experiencing a Mental Health or Behavioral Health Crisis or need someone to talk to, please call the Suicide and Crisis Lifeline: 988 call the [place] National Suicide Prevention Lifeline: 319-363-2250 or TTY: (925) 286-1302 TTY 434-578-7941) to talk to a trained counselor call 1-800-273-TALK (toll free, 24 hour hotline)   Kathyrn Sheriff, RN, MSN, BSN, CCM Care Management Coordinator [phone]

## 2023-03-28 ENCOUNTER — Ambulatory Visit: Payer: Medicare HMO | Admitting: Obstetrics and Gynecology

## 2023-03-28 ENCOUNTER — Encounter: Payer: Self-pay | Admitting: Obstetrics and Gynecology

## 2023-03-28 VITALS — BP 148/70 | HR 65

## 2023-03-28 DIAGNOSIS — N3281 Overactive bladder: Secondary | ICD-10-CM | POA: Diagnosis not present

## 2023-03-28 DIAGNOSIS — N390 Urinary tract infection, site not specified: Secondary | ICD-10-CM | POA: Diagnosis not present

## 2023-03-28 MED ORDER — ESTRADIOL 0.1 MG/GM VA CREA: 0.5000 g | TOPICAL_CREAM | VAGINAL | 11 refills | Status: AC

## 2023-03-28 MED ORDER — VIBEGRON 75 MG PO TABS
1.0000 | ORAL_TABLET | Freq: Every day | ORAL | 11 refills | Status: AC
Start: 2023-03-28 — End: ?

## 2023-03-28 NOTE — Progress Notes (Signed)
[facility] Return Visit  SUBJECTIVE  History of Present Illness: Heather [name] is a 88 y.o. female seen in follow-up for overactive bladder and recurrent UTI.  Thought maybe she had a UTI a few months ago but ended up having pain for a different reason. Recently had skin cancer removed from her leg and has been dealing with dressing changes.   She is taking Gemtesa only when she feel like she needs it- like if she is going on a trip. Does not help with nighttime wakening. She wakes up about every hour and has to urinate. She does have a history of snoring and sometimes wakes herself up. Daughter reports that she is sometimes embarrassed because the urine leakage comes out of nowhere.   Has not had any urinary tract infections. She is using estrogen when she remembers.   s/p Exam under anesthesia, posterior repair, sacrospinous ligament fixation, midurethral sling and cystoscopy on 10/27/20 as well as a sling release on 11/09/20  Past Medical History: Patient  has a past medical history of Aortic atherosclerosis (HCC) (10/12/2021), Cancer (HCC), Cataract, Chronic heart failure with preserved ejection fraction (HFpEF) (HCC), Chronic stable angina, Coronary artery disease (cardiologist--- dr Excell Seltzer), Diabetes mellitus type 2, diet-controlled (HCC), DOE (dyspnea on exertion), Echocardiogram 08/2020, Edema of right lower extremity, GERD (gastroesophageal reflux disease), Hiatal hernia, History of cervical cancer, History of DVT of lower extremity (2016), History of esophageal stricture, History of gastric ulcer (2005 approx.), History of palpitations (2010), History of TIA (transient ischemic attack) (06/1999), Hypertension, Intermittent palpitations, IT band syndrome, Migraine, Mixed hyperlipidemia, Mixed incontinence urge and stress, Multiple thyroid nodules, OA (osteoarthritis), Occasional tremors, Osteoporosis, Peroneal DVT (deep venous thrombosis) (HCC) (12/22/2014), Right bundle branch  block (RBBB) with left anterior fascicular block (LAFB), RLS (restless legs syndrome), S/P drug eluting coronary stent placement (2006), Stroke [facility]), and Urinary retention.   Past Surgical History: She  has a past surgical history that includes Knee arthroscopy (Bilateral, right ?/   left x2 , last one 09-12-2009 @ [facility]); Foot surgery (Left, 1990s); Upper gastrointestinal endoscopy (last one 04-25-2017); Total knee arthroplasty (11/12/2011); Total knee arthroplasty (Right, 11/29/2014); Colonoscopy (last one ?); Cataract extraction w/ intraocular lens  implant, bilateral (2015); Hiatal hernia repair (1989); Anterior and posterior repair (N/A, 12/22/2019); Vaginal hysterectomy (1988); Coronary angioplasty with stent (03-16-2005   dr Riley Kill); Coronary angioplasty with stent (06-27-2005  dr Riley Kill); Rectocele repair (N/A, 04/20/2020); Rectocele repair (N/A, 10/27/2020); Anterior and posterior repair with sacrospinous fixation (N/A, 10/27/2020); Bladder suspension (N/A, 10/27/2020); Cystoscopy (N/A, 10/27/2020); Pubovaginal sling (N/A, 11/09/2020); Cystoscopy (N/A, 11/09/2020); Cholecystectomy (N/A, 01/25/2021); Incisional hernia repair (N/A, 01/25/2021); Inguinal hernia repair (Left, 04/17/2021); Joint replacement; Small intestine surgery; Eye surgery; Fracture surgery; Hernia repair; Ventral hernia repair (N/A, 02/11/2023); and Lysis of adhesion (N/A, 02/11/2023).   Medications: She has a current medication list which includes the following prescription(s): acetaminophen, amlodipine, aspirin ec, true metrix air glucose meter, cyclobenzaprine, diclofenac sodium, true metrix blood glucose test, optase comfort dry eye, isosorbide mononitrate, losartan, magnesium oxide, metoprolol succinate, olopatadine hcl, omeprazole, pramipexole, simvastatin, trueplus lancets 33g, vibegron, and estradiol.   Allergies: Patient is allergic to baclofen, gabapentin, and nitrofurantoin.   Social History: Patient  reports that she  has never smoked. She has never used smokeless tobacco. She reports that she does not drink alcohol and does not use drugs.      OBJECTIVE     Physical Exam: Vitals:   03/28/23 1153  BP: (!) 146/66  Pulse: 75    Gen:  No apparent distress, A&O x 3.  Detailed Urogynecologic Evaluation:  Deferred.    ASSESSMENT AND PLAN    Ms. Urbaez is a 88 y.o. with:  1. Overactive bladder   2. Recurrent urinary tract infection     - For rUTI, continue vaginal estrace cream 0.5g twice weekly, refill sent. We discussed importance of regular use. Offered estring as an alternative since it can be placed once every 3 months but she would rather use the cream. - refilled Gemtresa 75mg . Encouraged her to take daily as it can help with incontinence episodes.  - For nocturia, recommended talking with her PCP about a sleep study as she seems to be waking frequently. We reviewed that untreated sleep apnea can also cause nocturia.   Return 1 year or sooner if needed  Marguerita Beards, MD  Time spent: I spent 25 minutes dedicated to the care of this patient on the date of this encounter to include pre-visit review of records, face-to-face time with the patient  and post visit documentation and ordering medication/ testing.

## 2023-03-29 ENCOUNTER — Ambulatory Visit: Payer: Medicare HMO | Admitting: Obstetrics and Gynecology

## 2023-04-01 ENCOUNTER — Ambulatory Visit: Payer: Self-pay | Admitting: Licensed Clinical Social Worker

## 2023-04-01 NOTE — Assessment & Plan Note (Signed)
No recent exacerbation, no changes

## 2023-04-01 NOTE — Assessment & Plan Note (Signed)
hgba1c acceptable, minimize simple carbs. Increase exercise as tolerated. Continue current meds 

## 2023-04-01 NOTE — Patient Outreach (Signed)
Care Coordination   Follow Up Visit Note   04/01/2023 Name: Heather [name] MRN: [id number] DOB: [birth year]  Heather [name] is a 88 y.o. year old female who sees Bradd Canary, MD for primary care. I spoke with  [name] by phone today.  What matters to the patients health and wellness today?   Patient had a recent procedure on her right leg and is healing from skin procedure on her right leg     Goals Addressed             This Visit's Progress    Patient Stated she had recent procedure on her right leg (skin procedure)       Interventions:  Spoke with client via phone about client needs.  Discussed program support with RN, LCSW, Pharmacist Discussed client recent procedure on her right leg (skin procedure). Client has slow healing wound on right leg  (Recent  skin procedure.) She dresses wound daily. Discussed food procurement. She has no food issues at present. Her daughter, Heather [name], helps client with client food needs Discussed transportation issues. She has transport help as needed Discussed energy level. She has decreased energy at present Discussed family support with daughter and son. Her family is supportive Client has appointment with PCP this week. Client has appointment with surgeon on 04/08/23 Provided counseling support Discussed pain issues. She has pain issues. She said she has burning sensation in her leg and foot. Discussed coping skills. She is trying to manage pain issues at this time Encouraged client to call LCSW as needed for SW support at [phone].  Thanked client for phone call with LCSW LCSW left phone message with RN Kathyrn Sheriff today informing RN about skin condition and skin needs of client           SDOH assessments and interventions completed:  Yes  SDOH Interventions Today    Flowsheet Row Most Recent Value  SDOH Interventions   Depression Interventions/Treatment  Counseling  Physical Activity Interventions Other (Comments)   [decreased mobility]  Stress Interventions Provide Counseling  [has stress in managing medical needs]        Care Coordination Interventions:  Yes, provided   Interventions Today    Flowsheet Row Most Recent Value  Chronic Disease   Chronic disease during today's visit Other  [spoke with client about client needs]  General Interventions   General Interventions Discussed/Reviewed General Interventions Discussed, [employer] program support with RN, LCSW, Pharmacist]  Education Interventions   Education Provided Provided Education  Provided Verbal Education On [employer] has slow healing wound on her leg. She dresses wound daily. She has appointment again with surgeon on 04/08/23]  Mental Health Interventions   Mental Health Discussed/Reviewed Mental Health Discussed, Coping Strategies  [client is trying to cope with medical needs. Has some swelling in her leg and ankle. She has talked with doctor about swelling in her leg and ankle. Has difficulty wearing shoes at present]  Nutrition Interventions   Nutrition Discussed/Reviewed Nutrition Discussed  Pharmacy Interventions   Pharmacy Dicussed/Reviewed Pharmacy Topics Discussed  Safety Interventions   Safety Discussed/Reviewed Fall Risk        Follow up plan: Follow up call scheduled for 05/19/13 at 1:00 PM     Encounter Outcome:  Patient Visit Completed   Kelton Pillar.Rukaya Kleinschmidt MSW, LCSW Licensed Visual merchandiser [facility] Care Management [phone]

## 2023-04-01 NOTE — Patient Instructions (Signed)
Visit Information  Thank you for taking time to visit with me today. Please don't hesitate to contact me if I can be of assistance to you.   Following are the goals we discussed today:   Goals Addressed             This Visit's Progress    Patient Stated she had recent procedure on her right leg (skin procedure)       Interventions:  Spoke with client via phone about client needs.  Discussed program support with RN, LCSW, Pharmacist Discussed client recent procedure on her right leg (skin procedure). Client has slow healing wound on right leg  (Recent  skin procedure.) She dresses wound daily. Discussed food procurement. She has no food issues at present. Her daughter, [name], helps client with client food needs Discussed transportation issues. She has transport help as needed Discussed energy level. She has decreased energy at present Discussed family support with daughter and son. Her family is supportive Client has appointment with PCP this week. Client has appointment with surgeon on 04/08/23 Provided counseling support Discussed pain issues. She has pain issues. She said she has burning sensation in her leg and foot. Discussed coping skills. She is trying to manage pain issues at this time Encouraged client to call LCSW as needed for SW support at [phone].  Thanked client for phone call with LCSW LCSW left phone message with RN Kathyrn Sheriff today informing RN about skin condition and skin needs of client           Our next appointment is by telephone on 05/20/23 at 1:00 PM   Please call the care guide team at [phone] if you need to cancel or reschedule your appointment.   If you are experiencing a Mental Health or Behavioral Health Crisis or need someone to talk to, please go to [facility] [address], [place] [phone])   The patient verbalized understanding of instructions, educational materials, and care plan  provided today and DECLINED offer to receive copy of patient instructions, educational materials, and care plan.   The patient has been provided with contact information for the care management team and has been advised to call with any health related questions or concerns.   Kelton Pillar.Lynna Zamorano MSW, LCSW Licensed Visual merchandiser [facility] Care Management [phone]

## 2023-04-01 NOTE — Assessment & Plan Note (Signed)
Well controlled, no changes to meds. Encouraged heart healthy diet such as the DASH diet and exercise as tolerated.  °

## 2023-04-01 NOTE — Assessment & Plan Note (Signed)
monitor

## 2023-04-02 ENCOUNTER — Encounter: Payer: Self-pay | Admitting: Family Medicine

## 2023-04-02 ENCOUNTER — Ambulatory Visit: Payer: Medicare HMO | Admitting: Family Medicine

## 2023-04-02 VITALS — BP 128/72 | HR 69 | Temp 98.0°F | Resp 16 | Ht 62.0 in | Wt 146.2 lb

## 2023-04-02 DIAGNOSIS — S0990XD Unspecified injury of head, subsequent encounter: Secondary | ICD-10-CM

## 2023-04-02 DIAGNOSIS — N289 Disorder of kidney and ureter, unspecified: Secondary | ICD-10-CM

## 2023-04-02 DIAGNOSIS — E538 Deficiency of other specified B group vitamins: Secondary | ICD-10-CM

## 2023-04-02 DIAGNOSIS — R296 Repeated falls: Secondary | ICD-10-CM

## 2023-04-02 DIAGNOSIS — R5381 Other malaise: Secondary | ICD-10-CM

## 2023-04-02 DIAGNOSIS — I5032 Chronic diastolic (congestive) heart failure: Secondary | ICD-10-CM

## 2023-04-02 DIAGNOSIS — H539 Unspecified visual disturbance: Secondary | ICD-10-CM

## 2023-04-02 DIAGNOSIS — E1121 Type 2 diabetes mellitus with diabetic nephropathy: Secondary | ICD-10-CM

## 2023-04-02 DIAGNOSIS — Z23 Encounter for immunization: Secondary | ICD-10-CM

## 2023-04-02 DIAGNOSIS — I10 Essential (primary) hypertension: Secondary | ICD-10-CM | POA: Diagnosis not present

## 2023-04-02 DIAGNOSIS — S0990XA Unspecified injury of head, initial encounter: Secondary | ICD-10-CM | POA: Insufficient documentation

## 2023-04-02 NOTE — Progress Notes (Signed)
Subjective:    Patient ID: Heather [name], female    DOB: January 13, 1935, 88 y.o.   MRN: [id number]  Chief Complaint  Patient presents with  . Follow-up    Follow up    HPI Discussed the use of AI scribe software for clinical note transcription with the patient, who gave verbal consent to proceed.  History of Present Illness   The patient, with a history of multiple surgeries, a recent fall, and SCC removal from her right leg, presents with several concerns. She reports numbness on the top of her head, which started a week after her fall. She is unsure of the cause of the fall but recalls hitting her head, which required stitches. The numbness persists despite a CT scan that showed no abnormalities.  In addition to the numbness, the patient has been experiencing blurry vision and an increase in floaters in her eyes. These symptoms have been present for a long time but have worsened recently. She also reports occasional dizziness, described as a quick, off-balance feeling that passes quickly. There is no discernible pattern to these episodes.  The patient also mentions fatigue and a lack of energy, which she attributes to her recent surgeries and the healing process. She had a carcinoma removed from her right leg, which has resulted in significant pain and swelling. The patient has a history of blood clots following knee surgery and reports calf pain after her recent surgery, although an ultrasound did not reveal any clots.  The patient's daughter is present during the consultation and provides additional information about the patient's condition and recent medical history.        Past Medical History:  Diagnosis Date  . Aortic atherosclerosis (HCC) 10/12/2021  . Cancer (HCC)    Uterine  . Cataract   . Chronic heart failure with preserved ejection fraction (HFpEF) (HCC)   . Chronic stable angina   . Coronary artery disease cardiologist--- dr Excell Seltzer  . Diabetes mellitus type 2,  diet-controlled (HCC)    followed by pcp  (10-19-2020  pt stated checks daily in am,  fasting blood surgar--- 115--120s)  . DOE (dyspnea on exertion)    per pt "when I over do",  ok with household chores  . Echocardiogram 08/2020    Echocardiogram 2/22: EF 55-60, no RWMA, mild LVH, Gr 2 DD, GLS-21.7%, normal RVSF, trivial MR, RVSP 39.5  . Edema of right lower extremity   . GERD (gastroesophageal reflux disease)   . Hiatal hernia    recurrence,  hx HH repair 1989  . History of cervical cancer    s/p  vaginal hysterectomy  . History of DVT of lower extremity 2016   11-29-2014 post op right TKA of right lower extremity and completed xarelto   . History of esophageal stricture    hx s/p dilatation's  . History of gastric ulcer 2005 approx.  Marland Kitchen History of palpitations 2010   event monitor 07-07-2009 showed NSR w/ freq. SVT ectopies with short runs, rare PVCs  . History of TIA (transient ischemic attack) 06/1999   12-15-2019  per pt had several TIA between 12/ 2000 to 02/ 2001 , was sent to specialist @[facility] , had test that was normal (10-19-2020 pt stated no TIAs since ) but has residual of essential tremors of right arm/ hand  . Hypertension   . Intermittent palpitations   . IT band syndrome   . Migraine    "ice pick headche lasts about 30 seconds"  . Mixed hyperlipidemia   .  Mixed incontinence urge and stress   . Multiple thyroid nodules    followed by pcp---   ultrasound 11-22-2014 no bx   (12-15-2019 per pt had a endocrinologist and was told did not need bx)  . OA (osteoarthritis)    knees, elbow, hip, ankles  . Occasional tremors    right arm/ hand  s/p TIA residual 2000  . Osteoporosis    taking vitamin d  . Peroneal DVT (deep venous thrombosis) (HCC) 12/22/2014  . Right bundle branch block (RBBB) with left anterior fascicular block (LAFB)   . RLS (restless legs syndrome)   . S/P drug eluting coronary stent placement 2006   03-16-2005  PCI x1 DES to LCx;   06-27-2005  PCI x1 DES  to RCA  . Stroke (HCC)    TIA's  . Urinary retention    post op sling prodecure on 10-27-2020, has foley cathether    Past Surgical History:  Procedure Laterality Date  . ANTERIOR AND POSTERIOR REPAIR N/A 12/22/2019   Procedure: ANTERIOR (CYSTOCELE)  REPAIR;  Surgeon: Sherian Rein, MD;  Location: [facility];  Service: Gynecology;  Laterality: N/A;  . ANTERIOR AND POSTERIOR REPAIR WITH SACROSPINOUS FIXATION N/A 10/27/2020   Procedure: SACROSPINOUS LIGAMENT FIXATION;  Surgeon: Marguerita Beards, MD;  Location: [facility];  Service: Gynecology;  Laterality: N/A;  . BLADDER SUSPENSION N/A 10/27/2020   Procedure: TRANSVAGINAL TAPE (TVT) PROCEDURE;  Surgeon: Marguerita Beards, MD;  Location: [facility];  Service: Gynecology;  Laterality: N/A;  . CATARACT EXTRACTION W/ INTRAOCULAR LENS  IMPLANT, BILATERAL  2015  . CHOLECYSTECTOMY N/A 01/25/2021   Procedure: LAPAROSCOPIC CHOLECYSTECTOMY WITH INTRAOPERATIVE CHOLANGIOGRAM AND LYSIS OF ADHESIONS;  Surgeon: Karie Soda, MD;  Location: [facility];  Service: General;  Laterality: N/A;  . COLONOSCOPY  last one ?  . CORONARY ANGIOPLASTY WITH STENT PLACEMENT  03-16-2005   dr Riley Kill   PCI and DES x1 to LCx  . CORONARY ANGIOPLASTY WITH STENT PLACEMENT  06-27-2005  dr Riley Kill   PCI and DES x1 to RCA with residual disease LAD 70-80% to manage medically  . CYSTOSCOPY N/A 10/27/2020   Procedure: CYSTOSCOPY;  Surgeon: Marguerita Beards, MD;  Location: [facility];  Service: Gynecology;  Laterality: N/A;  . CYSTOSCOPY N/A 11/09/2020   Procedure: CYSTOSCOPY;  Surgeon: Marguerita Beards, MD;  Location: [facility];  Service: Gynecology;  Laterality: N/A;  . EYE SURGERY    . FOOT SURGERY Left 1990s   left foot stress fracture repair, per pt no hardware  . FRACTURE SURGERY    . HERNIA REPAIR    . HIATAL HERNIA REPAIR  1989  . INCISIONAL HERNIA REPAIR N/A  01/25/2021   Procedure: PRIMARY REPAIR OF INCISIONAL HERNIA;  Surgeon: Karie Soda, MD;  Location: [facility];  Service: General;  Laterality: N/A;  . INGUINAL HERNIA REPAIR Left 04/17/2021   Procedure: LAPAROSCOPIC LEFT INGUINAL HERNIA REPAIR WITH MESH;  Surgeon: Axel Filler, MD;  Location: [facility];  Service: General;  Laterality: Left;  . JOINT REPLACEMENT    . KNEE ARTHROSCOPY Bilateral right ?/   left x2 , last one 09-12-2009 @ [facility]  . LYSIS OF ADHESION N/A 02/11/2023   Procedure: LYSIS OF ADHESION;  Surgeon: Axel Filler, MD;  Location: [facility];  Service: General;  Laterality: N/A;  . PUBOVAGINAL SLING N/A 11/09/2020   Procedure: REVISION OF PUBO-VAGINAL SLING;  Surgeon: Marguerita Beards, MD;  Location: [facility];  Service:  Gynecology;  Laterality: N/A;  . RECTOCELE REPAIR N/A 04/20/2020   Procedure: POSTERIOR REPAIR (RECTOCELE);  Surgeon: Sherian Rein, MD;  Location: [facility];  Service: Gynecology;  Laterality: N/A;  . RECTOCELE REPAIR N/A 10/27/2020   Procedure: POSTERIOR REPAIR (RECTOCELE);  Surgeon: Marguerita Beards, MD;  Location: [facility];  Service: Gynecology;  Laterality: N/A;  total time requested for all procedures is 2 hours  . SMALL INTESTINE SURGERY    . TOTAL KNEE ARTHROPLASTY  11/12/2011   Procedure: TOTAL KNEE ARTHROPLASTY;  Surgeon: Nilda Simmer, MD;  Location: [facility];  Service: Orthopedics;  Laterality: Left;  Dr Thurston Hole wants 90 minutes for this case  . TOTAL KNEE ARTHROPLASTY Right 11/29/2014   Procedure: RIGHT TOTAL KNEE ARTHROPLASTY;  Surgeon: Dannielle Huh, MD;  Location: [facility];  Service: Orthopedics;  Laterality: Right;  . UPPER GASTROINTESTINAL ENDOSCOPY  last one 04-25-2017   with dilatation esophageal stricture and savary dilatation  . VAGINAL HYSTERECTOMY  1988    no ovaries removed for bleeeding  . VENTRAL HERNIA REPAIR N/A 02/11/2023   Procedure: LAPAROSCOPIC ATTEMPTED OPEN VENTRAL HERNIA  REPAIR WITH MESH;  Surgeon: Axel Filler, MD;  Location: [facility] OR;  Service: General;  Laterality: N/A;  2.5 HRS    Family History  Problem Relation Age of Onset  . Stroke Father        family hx of M 1st degree relative <50  . Heart disease Father   . Coronary artery disease Mother   . Heart disease Mother   . Arthritis Mother   . Hearing loss Mother   . Vision loss Mother   . Depression Brother   . Cancer Brother   . Stroke Brother   . Diabetes Brother   . Cancer Brother        bladder with mets  . Diabetes Daughter        borderline  . Hypertension Daughter   . Arthritis Other        family hx of  . Hypertension Other        family hx of  . Other Other        family hx of cardiovascular disorder  . Thyroid disease Daughter   . Early death Daughter   . Breast cancer Neg Hx   . Colon cancer Neg Hx   . Anesthesia problems Neg Hx   . Hypotension Neg Hx   . Malignant hyperthermia Neg Hx   . Pseudochol deficiency Neg Hx   . Colon polyps Neg Hx   . Esophageal cancer Neg Hx   . Rectal cancer Neg Hx   . Stomach cancer Neg Hx     Social History   Socioeconomic History  . Marital status: Widowed    Spouse name: Not on file  . Number of children: 3  . Years of education: 30  . Highest education level: Associate degree: occupational, Scientist, product/process development, or vocational program  Occupational History  . Occupation: works partime in Public affairs consultant: RETIRED    Comment: retired  Tobacco Use  . Smoking status: Never  . Smokeless tobacco: Never  Vaping Use  . Vaping status: Never Used  Substance and Sexual Activity  . Alcohol use: No    Alcohol/week: 0.0 standard drinks of alcohol  . Drug use: Never  . Sexual activity: Not Currently    Birth control/protection: Surgical    Comment: lives alone  Other Topics Concern  . Not on file  Social History  Narrative   Lives with husband, Caffeine use- Half Caffeine)- 2 cups daily.  3 children living, one passed away.   Education:  HS. Business college.  Retired.    Social Determinants of Health   Financial Resource Strain: Low Risk  (12/12/2022)   Overall Financial Resource Strain (CARDIA)   . Difficulty of Paying Living Expenses: Not hard at all  Food Insecurity: No Food Insecurity (03/15/2023)   Hunger Vital Sign   . Worried About Programme researcher, broadcasting/film/video in the Last Year: Never true   . Ran Out of Food in the Last Year: Never true  Transportation Needs: No Transportation Needs (03/15/2023)   PRAPARE - Transportation   . Lack of Transportation (Medical): No   . Lack of Transportation (Non-Medical): No  Physical Activity: Inactive (04/01/2023)   Exercise Vital Sign   . Days of Exercise per Week: 0 days   . Minutes of Exercise per Session: 0 min  Stress: Stress Concern Present (04/01/2023)   [employer] of Occupational Health - Occupational Stress Questionnaire   . Feeling of Stress : Rather much  Social Connections: Moderately Isolated (12/12/2022)   Social Connection and Isolation Panel [NHANES]   . Frequency of Communication with Friends and Family: More than three times a week   . Frequency of Social Gatherings with Friends and Family: Once a week   . Attends Religious Services: More than 4 times per year   . Active Member of Clubs or Organizations: No   . Attends Banker Meetings: Never   . Marital Status: Widowed  Intimate Partner Violence: Not At Risk (02/14/2023)   Humiliation, Afraid, Rape, and Kick questionnaire   . Fear of Current or Ex-Partner: No   . Emotionally Abused: No   . Physically Abused: No   . Sexually Abused: No    Outpatient Medications Prior to Visit  Medication Sig Dispense Refill  . acetaminophen (TYLENOL) 500 MG tablet Take 1,000 mg by mouth every 6 (six) hours as needed for moderate pain.    Marland Kitchen amLODipine (NORVASC) 10 MG tablet TAKE 1 TABLET EVERY DAY 90 tablet 3  . aspirin EC 81 MG tablet Take 1 tablet (81 mg total) by mouth daily.    . Blood Glucose Monitoring Suppl  (TRUE METRIX AIR GLUCOSE METER) w/Device KIT USE TO CHECK BLOOD SUGAR ONCE DAILY AND AS NEEDED.  DX CODE E11.9 1 kit 0  . cyclobenzaprine (FLEXERIL) 5 MG tablet Take 1 tablet (5 mg total) by mouth 3 (three) times daily as needed for muscle spasms. 30 tablet 0  . diclofenac Sodium (VOLTAREN) 1 % GEL Apply 1 application topically 4 (four) times daily as needed (hip pain).    Marland Kitchen estradiol (ESTRACE) 0.1 MG/GM vaginal cream Place 0.5 g vaginally 2 (two) times a week. Place 0.5g nightly for two weeks then twice a week after 30 g 11  . glucose blood (TRUE METRIX BLOOD GLUCOSE TEST) test strip TEST BLOOD SUGAR EVERY DAY  OR AS NEEDED 200 strip 12  . Glycerin, PF, (OPTASE COMFORT DRY EYE) 1 % SOLN Place 1 drop into both eyes in the morning and at bedtime.    . isosorbide mononitrate (IMDUR) 60 MG 24 hr tablet TAKE 1 AND 1/2 TABLETS EVERY DAY 135 tablet 3  . losartan (COZAAR) 100 MG tablet TAKE 1 TABLET EVERY DAY 90 tablet 1  . Magnesium Oxide 250 MG TABS Take 250 mg by mouth every morning.    . metoprolol succinate (TOPROL-XL) 25 MG 24 hr  tablet TAKE 1/2 TABLET EVERY DAY 45 tablet 3  . Olopatadine HCl (PATADAY) 0.2 % SOLN Place 1 drop into both eyes daily.    Marland Kitchen omeprazole (PRILOSEC) 20 MG capsule TAKE 1 CAPSULE EVERY DAY AS NEEDED (NEED OFFICE VISIT BEFORE ANY FURTHER REFILLS) 90 capsule 3  . pramipexole (MIRAPEX) 0.25 MG tablet TAKE 1 TABLET TWICE DAILY 180 tablet 3  . simvastatin (ZOCOR) 20 MG tablet TAKE 1 TABLET AT BEDTIME 90 tablet 1  . TRUEplus Lancets 33G MISC USE TO CHECK BLOOD SUGAR ONCE DAILY AND AS NEEDED.  DX CODE: E11.9 200 each 1  . Vibegron 75 MG TABS Take 1 tablet (75 mg total) by mouth daily. 30 tablet 11   No facility-administered medications prior to visit.    Allergies  Allergen Reactions  . Baclofen Other (See Comments)    Hyperactivity   . Gabapentin Other (See Comments)    Made her hyper  . Nitrofurantoin Rash    Review of Systems  Constitutional:  Positive for  malaise/fatigue. Negative for fever.  HENT:  Negative for congestion.   Eyes:  Positive for blurred vision. Negative for pain.  Respiratory:  Negative for shortness of breath.   Cardiovascular:  Positive for leg swelling. Negative for chest pain and palpitations.  Gastrointestinal:  Negative for abdominal pain, blood in stool and nausea.  Genitourinary:  Negative for dysuria and frequency.  Musculoskeletal:  Positive for falls.  Skin:  Negative for rash.  Neurological:  Positive for sensory change. Negative for dizziness, loss of consciousness and headaches.  Endo/Heme/Allergies:  Negative for environmental allergies.  Psychiatric/Behavioral:  Negative for depression. The patient is nervous/anxious.       Objective:    Physical Exam Constitutional:      General: She is not in acute distress.    Appearance: Normal appearance. She is well-developed. She is not toxic-appearing.  HENT:     Head: Normocephalic and atraumatic.     Right Ear: External ear normal.     Left Ear: External ear normal.     Nose: Nose normal.  Eyes:     General:        Right eye: No discharge.        Left eye: No discharge.     Conjunctiva/sclera: Conjunctivae normal.  Neck:     Thyroid: No thyromegaly.  Cardiovascular:     Rate and Rhythm: Normal rate and regular rhythm.     Heart sounds: Normal heart sounds. No murmur heard. Pulmonary:     Effort: Pulmonary effort is normal. No respiratory distress.     Breath sounds: Normal breath sounds.  Abdominal:     General: Bowel sounds are normal.     Palpations: Abdomen is soft.     Tenderness: There is no abdominal tenderness. There is no guarding.  Musculoskeletal:        General: Normal range of motion.     Cervical back: Neck supple.  Lymphadenopathy:     Cervical: No cervical adenopathy.  Skin:    General: Skin is warm and dry.  Neurological:     Mental Status: She is alert and oriented to person, place, and time.  Psychiatric:        Mood and  Affect: Mood normal.        Behavior: Behavior normal.        Thought Content: Thought content normal.        Judgment: Judgment normal.   BP 128/72 (BP Location: Left Arm, Patient Position: Sitting,  Cuff Size: Normal)   Pulse 69   Temp 98 F (36.7 C) (Oral)   Resp 16   Ht 5\' 2"  (1.575 m)   Wt 146 lb 3.2 oz (66.3 kg)   LMP  (LMP Unknown)   SpO2 95%   BMI 26.74 kg/m  Wt Readings from Last 3 Encounters:  04/02/23 146 lb 3.2 oz (66.3 kg)  03/08/23 147 lb (66.7 kg)  03/04/23 147 lb (66.7 kg)    Diabetic Foot Exam - Simple   No data filed    Lab Results  Component Value Date   WBC 8.9 03/08/2023   HGB 12.1 03/08/2023   HCT 36.9 03/08/2023   PLT 186 03/08/2023   GLUCOSE 160 (H) 03/08/2023   CHOL 141 03/22/2022   TRIG 98.0 03/22/2022   HDL 64.60 03/22/2022   LDLDIRECT 73.4 05/20/2014   LDLCALC 57 03/22/2022   ALT 37 03/08/2023   AST 87 (H) 03/08/2023   NA 135 03/08/2023   K 4.0 03/08/2023   CL 102 03/08/2023   CREATININE 1.02 (H) 03/08/2023   BUN 25 (H) 03/08/2023   CO2 25 03/08/2023   TSH 1.91 03/22/2022   INR 1.1 02/07/2021   HGBA1C 6.7 (H) 02/05/2023   MICROALBUR <0.7 10/20/2021    Lab Results  Component Value Date   TSH 1.91 03/22/2022   Lab Results  Component Value Date   WBC 8.9 03/08/2023   HGB 12.1 03/08/2023   HCT 36.9 03/08/2023   MCV 94.4 03/08/2023   PLT 186 03/08/2023   Lab Results  Component Value Date   NA 135 03/08/2023   K 4.0 03/08/2023   CO2 25 03/08/2023   GLUCOSE 160 (H) 03/08/2023   BUN 25 (H) 03/08/2023   CREATININE 1.02 (H) 03/08/2023   BILITOT 0.7 03/08/2023   ALKPHOS 108 03/08/2023   AST 87 (H) 03/08/2023   ALT 37 03/08/2023   PROT 6.6 03/08/2023   ALBUMIN 3.8 03/08/2023   CALCIUM 8.9 03/08/2023   ANIONGAP 8 03/08/2023   EGFR 48 (L) 06/14/2021   GFR 57.94 (L) 02/25/2023   Lab Results  Component Value Date   CHOL 141 03/22/2022   Lab Results  Component Value Date   HDL 64.60 03/22/2022   Lab Results   Component Value Date   LDLCALC 57 03/22/2022   Lab Results  Component Value Date   TRIG 98.0 03/22/2022   Lab Results  Component Value Date   CHOLHDL 2 03/22/2022   Lab Results  Component Value Date   HGBA1C 6.7 (H) 02/05/2023       Assessment & Plan:  Essential hypertension Assessment & Plan: Well controlled, no changes to meds. Encouraged heart healthy diet such as the DASH diet and exercise as tolerated.    Chronic diastolic heart failure (HCC) Assessment & Plan: No recent exacerbation, no  changes   Type 2 diabetes mellitus with diabetic nephropathy, without long-term current use of insulin (HCC) Assessment & Plan: hgba1c acceptable, minimize simple carbs. Increase exercise as tolerated. Continue current meds   Disorder of vitamin B12 Assessment & Plan: monitor   Recurrent falls Assessment & Plan: Agrees to home health evaluation with physical therapy to work on fall prevention and strength training order placed  Orders: -     Ambulatory referral to Home Health  Debility -     Ambulatory referral to Home Health  Traumatic injury of head, subsequent encounter Assessment & Plan: Larey Seat last month and hit her forehead required stitches but CT head was clear.  Now with blurry vision and numbness on top of scalp will proceed with CT head again. Consider neurology consult if worsens  Orders: -     CT HEAD WO CONTRAST ( ); Future  Visual changes Assessment & Plan: Long history of floaters but worse since head trauma and worsening episodes of blurry vision. Encouraged to see her ophthalmologist and CT head ordered.  Orders: -     CT HEAD WO CONTRAST ( ); Future  Immunization due -     Flu Vaccine Trivalent High Dose (Fluad)  Renal insufficiency Assessment & Plan: Hydrate and monitor   Chronic heart failure with preserved ejection fraction [facility]) Assessment & Plan: Stable no new concerns today     Assessment and Plan    Head Trauma Fall on  02/23/2023 with laceration and subsequent numbness on the scalp. No immediate abnormalities on initial CT scan. New onset blurry vision and floaters, no new headaches or dizziness. -Order repeat CT scan to rule out delayed subdural hemorrhage. -Refer to ophthalmology for evaluation of new onset blurry vision and increased floaters. -Consider neurology referral pending results of CT scan and ophthalmology evaluation.  Squamous Cell Carcinoma Recent removal of lesion on right leg, another lesion pending removal. -Continue follow-up and treatment as directed by dermatology.  Unsteady Gait Random episodes of feeling off balance, no clear pattern. -Initiate home physical therapy to improve balance and reduce fall risk.  Post-Operative Swelling Swelling in the foot following Mohs surgery on 03/05/2023. -Elevate foot above heart level for 15 minutes three times a day. -Reduce salt intake.  General Health Maintenance -Administer influenza vaccine today. -Encourage protein intake every 3-4 hours, hydration with 10 ounces of fluid every hour or two, and daily multivitamin with minerals and fatty acids. -Plan for follow-up appointment in 8-12 weeks.         Danise Edge, MD

## 2023-04-02 NOTE — Assessment & Plan Note (Signed)
Heather [name] last month and hit her forehead required stitches but CT head was clear. Now with blurry vision and numbness on top of scalp will proceed with CT head again. Consider neurology consult if worsens

## 2023-04-02 NOTE — Assessment & Plan Note (Signed)
Agrees to home health evaluation with physical therapy to work on fall prevention and strength training order placed

## 2023-04-02 NOTE — Assessment & Plan Note (Signed)
Stable no new concerns today

## 2023-04-02 NOTE — Patient Instructions (Addendum)
Protein every 3-4 hours 10 ounces every 1-2 hours  Multivitamin with minerals and Fish or krill oil caps  Head Injury, Adult There are many types of head injuries. Head injuries can be as minor as a small bump, or they can be a serious medical issue. More severe head injuries include: A jarring injury to the brain (concussion). A bruise (contusion) of the brain. This means there is bleeding in the brain that can cause swelling. A cracked skull (skull fracture). Bleeding in the brain that collects, clots, and forms a bump (hematoma). After a head injury, most problems occur within the first 24 hours, but side effects may occur up to 7-10 days after the injury. It is important to watch your condition for any changes. You may need to be observed in the emergency department or urgent care, or you may have to stay in the hospital. What are the causes? There are many causes of a head injury. Serious head injuries may be caused by car crashes, bicycle or motorcycle crashes, sports injuries, falls, or being struck by an object. What are the symptoms? Symptoms of a head injury include a contusion, bump, or bleeding at the site of the injury. Other physical symptoms may include: Headache. Nausea or vomiting. Dizziness. Blurred or double vision. Sensitivity to bright lights or loud noises. Feeling tired. Trouble waking up. Severe symptoms such as: Weakness or numbness on one side of the body. Slurred speech or swallowing problems. Loss of consciousness. Seizures. Mental symptoms may include: Irritability. Confusion and memory problems. Poor attention and concentration. Changes in eating or sleeping habits. Anxiety or depression. How is this diagnosed? This condition is diagnosed based on your symptoms and a physical exam. You may also have imaging tests done, such as a CT scan or an MRI. How is this treated? Treatment for this condition depends on the severity and type of injury you have.  The main goal of treatment is to prevent complications and allow the brain time to heal. Mild head injury If you have a mild head injury, you may be sent home, and treatment may include: Observation. A responsible adult should stay with you for 24 hours after your injury and check on you often. Physical rest. Brain rest. Pain medicines. Severe head injury If you have a severe head injury, treatment may include: Close observation. You may have to stay in the hospital and have: Frequent physical exams. Frequent checks of how your brain and nervous system are working. Your blood pressure and oxygen levels checked. Medicines to relieve pain, prevent seizures, and decrease brain swelling. Airway protection and breathing support. This may include using a ventilator. Monitoring and managing swelling inside the brain. Brain surgery. Surgery may include: Removing a collection of blood or blood clots. Stopping the bleeding. Removing a part of the skull to make room for the brain to swell. Follow these instructions at home: Activity Rest. Avoid activities that are hard or tiring. Make sure you get enough sleep. Let your brain rest by limiting activities that take a lot of thought or attention, such as: Watching TV. Playing memory games and doing puzzles. Job-related work or homework. Working on [employer], [employer], and texting. Avoid activities that could cause another head injury, such as playing sports, until your health care provider approves. Ask your provider when it is safe for you to return to your regular activities, such as work or school. Ask your provider when you can drive, ride a bicycle, or use machinery. Your  ability to react may be slower after a brain injury. Do not do these activities if you are dizzy. Lifestyle  Do not drink alcohol until your provider approves. Do not use drugs. Alcohol and certain drugs may slow your recovery and can put you at risk of further  injury. If it is hard to remember things, write them down. If you are easily distracted, try to do one thing at a time. Talk with family members or close friends when making important decisions. Tell your friends, family, a trusted colleague, and work Production designer, theatre/television/film about your injury, symptoms, and restrictions. Ask them to watch for any problems that are new or get worse. General instructions Take over-the-counter and prescription medicines only as told by your provider. Have a responsible adult stay with you for 24 hours after your head injury. They should watch you for any changes in your symptoms and be ready to get help right away. Keep all follow-up visits to make sure your needs are being met and catch any new problems early. How is this prevented? Avoiding another brain injury is very important. In rare cases, another injury can lead to permanent brain damage, brain swelling, or death. The risk of this is greatest during the first 7-10 days after a head injury. To avoid injuries: Improve your balance and strength to avoid falls. Wear a seat belt when you are in a moving vehicle. Wear a helmet when riding a bicycle, skiing, or doing any other sport that has a risk of injury. Take safety measures in your home to prevent falls, such as: Removing clutter and tripping hazards. Using grab bars in bathrooms and handrails by stairs. Placing non-slip mats on floors and in bathtubs. Improving lighting in dim areas. Where to find more information Brain Injury Association: biausa.org Contact a health care provider if: You have headaches that do not go away. You have dizziness that does not go away. You have double vision or vision changes that do not go away. You have difficulty sleeping. You have changes in your mood. You have new symptoms. Get help right away if: You have sudden: Severe headache. Severe vomiting. Unequal pupil size. One is bigger than the other. Vision problems. Confusion or  irritability. You have a seizure. Your symptoms get worse. You have clear or bloody fluid coming from your nose or ears. These symptoms may be an emergency. Get help right away. Call 911. Do not wait to see if the symptoms will go away. Do not drive yourself to the hospital. This information is not intended to replace advice given to you by your health care provider. Make sure you discuss any questions you have with your health care provider. Document Revised: 04/12/2022 Document Reviewed: 04/12/2022 Elsevier Patient Education  2024 [employer].

## 2023-04-02 NOTE — Assessment & Plan Note (Signed)
Hydrate and monitor 

## 2023-04-02 NOTE — Assessment & Plan Note (Signed)
Long history of floaters but worse since head trauma and worsening episodes of blurry vision. Encouraged to see her ophthalmologist and CT head ordered.

## 2023-04-04 ENCOUNTER — Ambulatory Visit: Payer: Medicare HMO | Attending: Physician Assistant

## 2023-04-04 DIAGNOSIS — L718 Other rosacea: Secondary | ICD-10-CM | POA: Diagnosis not present

## 2023-04-04 DIAGNOSIS — H538 Other visual disturbances: Secondary | ICD-10-CM | POA: Diagnosis not present

## 2023-04-04 DIAGNOSIS — H0288A Meibomian gland dysfunction right eye, upper and lower eyelids: Secondary | ICD-10-CM | POA: Diagnosis not present

## 2023-04-04 DIAGNOSIS — H16223 Keratoconjunctivitis sicca, not specified as Sjogren's, bilateral: Secondary | ICD-10-CM | POA: Diagnosis not present

## 2023-04-04 DIAGNOSIS — E785 Hyperlipidemia, unspecified: Secondary | ICD-10-CM | POA: Diagnosis not present

## 2023-04-04 DIAGNOSIS — H5713 Ocular pain, bilateral: Secondary | ICD-10-CM | POA: Diagnosis not present

## 2023-04-04 DIAGNOSIS — S098XXA Other specified injuries of head, initial encounter: Secondary | ICD-10-CM | POA: Diagnosis not present

## 2023-04-04 DIAGNOSIS — H0288B Meibomian gland dysfunction left eye, upper and lower eyelids: Secondary | ICD-10-CM | POA: Diagnosis not present

## 2023-04-04 LAB — LIPID PANEL
Chol/HDL Ratio: 2.4 ratio (ref 0.0–4.4)
Cholesterol, Total: 150 mg/dL (ref 100–199)
HDL: 63 mg/dL (ref >39)
LDL Chol Calc (NIH): 68 mg/dL (ref 0–99)
Triglycerides: 108 mg/dL (ref 0–149)
VLDL Cholesterol Cal: 19 mg/dL (ref 5–40)

## 2023-04-05 DIAGNOSIS — I5032 Chronic diastolic (congestive) heart failure: Secondary | ICD-10-CM | POA: Diagnosis not present

## 2023-04-05 DIAGNOSIS — M159 Polyosteoarthritis, unspecified: Secondary | ICD-10-CM | POA: Diagnosis not present

## 2023-04-05 DIAGNOSIS — E782 Mixed hyperlipidemia: Secondary | ICD-10-CM | POA: Diagnosis not present

## 2023-04-05 DIAGNOSIS — N289 Disorder of kidney and ureter, unspecified: Secondary | ICD-10-CM | POA: Diagnosis not present

## 2023-04-05 DIAGNOSIS — E1121 Type 2 diabetes mellitus with diabetic nephropathy: Secondary | ICD-10-CM | POA: Diagnosis not present

## 2023-04-05 DIAGNOSIS — R296 Repeated falls: Secondary | ICD-10-CM | POA: Diagnosis not present

## 2023-04-05 DIAGNOSIS — E1151 Type 2 diabetes mellitus with diabetic peripheral angiopathy without gangrene: Secondary | ICD-10-CM | POA: Diagnosis not present

## 2023-04-05 DIAGNOSIS — I2510 Atherosclerotic heart disease of native coronary artery without angina pectoris: Secondary | ICD-10-CM | POA: Diagnosis not present

## 2023-04-05 DIAGNOSIS — I110 Hypertensive heart disease with heart failure: Secondary | ICD-10-CM | POA: Diagnosis not present

## 2023-04-08 DIAGNOSIS — C44722 Squamous cell carcinoma of skin of right lower limb, including hip: Secondary | ICD-10-CM | POA: Diagnosis not present

## 2023-04-10 ENCOUNTER — Encounter: Payer: Self-pay | Admitting: Family Medicine

## 2023-04-10 DIAGNOSIS — I110 Hypertensive heart disease with heart failure: Secondary | ICD-10-CM | POA: Diagnosis not present

## 2023-04-10 DIAGNOSIS — I2510 Atherosclerotic heart disease of native coronary artery without angina pectoris: Secondary | ICD-10-CM | POA: Diagnosis not present

## 2023-04-10 DIAGNOSIS — I5032 Chronic diastolic (congestive) heart failure: Secondary | ICD-10-CM | POA: Diagnosis not present

## 2023-04-10 DIAGNOSIS — E1121 Type 2 diabetes mellitus with diabetic nephropathy: Secondary | ICD-10-CM | POA: Diagnosis not present

## 2023-04-10 DIAGNOSIS — M159 Polyosteoarthritis, unspecified: Secondary | ICD-10-CM | POA: Diagnosis not present

## 2023-04-10 DIAGNOSIS — N289 Disorder of kidney and ureter, unspecified: Secondary | ICD-10-CM | POA: Diagnosis not present

## 2023-04-10 DIAGNOSIS — E782 Mixed hyperlipidemia: Secondary | ICD-10-CM | POA: Diagnosis not present

## 2023-04-10 DIAGNOSIS — E1151 Type 2 diabetes mellitus with diabetic peripheral angiopathy without gangrene: Secondary | ICD-10-CM | POA: Diagnosis not present

## 2023-04-10 DIAGNOSIS — R296 Repeated falls: Secondary | ICD-10-CM | POA: Diagnosis not present

## 2023-04-11 ENCOUNTER — Ambulatory Visit: Payer: Self-pay

## 2023-04-11 NOTE — Patient Outreach (Signed)
Care Coordination   Follow Up Visit Note   04/11/2023 Name: Heather [name] MRN: [id number] DOB: 10-24-1934  Heather [name] is a 88 y.o. year old female who sees Bradd Canary, MD for primary care. I spoke with  Heather [name] by phone today.  What matters to the patients health and wellness today?  Heather [name] reports physical therapy has contacted her for start of care. She states she continues to see dermatologist regarding skin cancer area to her right leg. Reports a skin cancer was removed on 04/08/23 from the left side of her right leg. And she is dressing as instructed by provider. She reports upcoming follow up appointment with Dermatologist on 05/08/23. Follow up with primary provider completed 04/02/23.     Goals Addressed             This Visit's Progress    Care Coordination Activities-health management       Interventions Today    Flowsheet Row Most Recent Value  Chronic Disease   Chronic disease during today's visit Hypertension (HTN), Other  [per patient skin cancer right leg]  General Interventions   General Interventions Discussed/Reviewed Doctor Visits, General Interventions Reviewed  [Evaluation of current treatment plan for health condition and patients adherence to plan.]  Doctor Visits Discussed/Reviewed Doctor Visits Reviewed  PCP/Specialist Visits Compliance with follow-up visit  [reviewed upcoming scheduled appointments]  Education Interventions   Education Provided Provided Education  Provided Verbal Education On Medication, When to see the doctor, Other, Exercise  [discussed importance of following recommendations of therapist to improve mobility, increase muschle strength and tone. advised to take medications as recommended, eat heatlh, contact provider with heatlh questions or concerns.]  Nutrition Interventions   Nutrition Discussed/Reviewed Nutrition Reviewed  Pharmacy Interventions   Pharmacy Dicussed/Reviewed Pharmacy Topics Reviewed  Safety  Interventions   Safety Discussed/Reviewed Fall Risk, Safety Reviewed            SDOH assessments and interventions completed:  No  Care Coordination Interventions:  Yes, provided   Follow up plan: Follow up call scheduled for 05/13/23    Encounter Outcome:  Patient Visit Completed   Kathyrn Sheriff, RN, MSN, BSN, CCM Care Management Coordinator [phone]

## 2023-04-11 NOTE — Patient Instructions (Signed)
Visit Information  Thank you for taking time to visit with me today. Please don't hesitate to contact me if I can be of assistance to you.   Following are the goals we discussed today:  Continue to take medications as prescribed. Continue to attend provider visits as scheduled Continue to eat healthy, lean meats, vegetables, fruits, avoid saturated and transfats Contact provider with health questions/concerns as needed   Our next appointment is by telephone on 05/13/23 at 1:30 pm  Please call the care guide team at [phone] if you need to cancel or reschedule your appointment.   If you are experiencing a Mental Health or Behavioral Health Crisis or need someone to talk to, please call the Suicide and Crisis Lifeline: 988 call the [place] National Suicide Prevention Lifeline: 931-666-9583 or TTY: 548-435-8380 TTY (906)050-6186) to talk to a trained counselor call 1-800-273-TALK (toll free, 24 hour hotline)   Kathyrn Sheriff, RN, MSN, BSN, CCM Care Management Coordinator [phone]

## 2023-04-16 DIAGNOSIS — N289 Disorder of kidney and ureter, unspecified: Secondary | ICD-10-CM | POA: Diagnosis not present

## 2023-04-16 DIAGNOSIS — E1151 Type 2 diabetes mellitus with diabetic peripheral angiopathy without gangrene: Secondary | ICD-10-CM | POA: Diagnosis not present

## 2023-04-16 DIAGNOSIS — E782 Mixed hyperlipidemia: Secondary | ICD-10-CM | POA: Diagnosis not present

## 2023-04-16 DIAGNOSIS — I110 Hypertensive heart disease with heart failure: Secondary | ICD-10-CM | POA: Diagnosis not present

## 2023-04-16 DIAGNOSIS — E1121 Type 2 diabetes mellitus with diabetic nephropathy: Secondary | ICD-10-CM | POA: Diagnosis not present

## 2023-04-16 DIAGNOSIS — I2510 Atherosclerotic heart disease of native coronary artery without angina pectoris: Secondary | ICD-10-CM | POA: Diagnosis not present

## 2023-04-16 DIAGNOSIS — M159 Polyosteoarthritis, unspecified: Secondary | ICD-10-CM | POA: Diagnosis not present

## 2023-04-16 DIAGNOSIS — I5032 Chronic diastolic (congestive) heart failure: Secondary | ICD-10-CM | POA: Diagnosis not present

## 2023-04-16 DIAGNOSIS — R296 Repeated falls: Secondary | ICD-10-CM | POA: Diagnosis not present

## 2023-04-18 DIAGNOSIS — I5032 Chronic diastolic (congestive) heart failure: Secondary | ICD-10-CM | POA: Diagnosis not present

## 2023-04-18 DIAGNOSIS — E782 Mixed hyperlipidemia: Secondary | ICD-10-CM | POA: Diagnosis not present

## 2023-04-18 DIAGNOSIS — E1151 Type 2 diabetes mellitus with diabetic peripheral angiopathy without gangrene: Secondary | ICD-10-CM | POA: Diagnosis not present

## 2023-04-18 DIAGNOSIS — M159 Polyosteoarthritis, unspecified: Secondary | ICD-10-CM | POA: Diagnosis not present

## 2023-04-18 DIAGNOSIS — N289 Disorder of kidney and ureter, unspecified: Secondary | ICD-10-CM | POA: Diagnosis not present

## 2023-04-18 DIAGNOSIS — E1121 Type 2 diabetes mellitus with diabetic nephropathy: Secondary | ICD-10-CM | POA: Diagnosis not present

## 2023-04-18 DIAGNOSIS — I2510 Atherosclerotic heart disease of native coronary artery without angina pectoris: Secondary | ICD-10-CM | POA: Diagnosis not present

## 2023-04-18 DIAGNOSIS — I110 Hypertensive heart disease with heart failure: Secondary | ICD-10-CM | POA: Diagnosis not present

## 2023-04-18 DIAGNOSIS — R296 Repeated falls: Secondary | ICD-10-CM | POA: Diagnosis not present

## 2023-04-23 DIAGNOSIS — M159 Polyosteoarthritis, unspecified: Secondary | ICD-10-CM | POA: Diagnosis not present

## 2023-04-23 DIAGNOSIS — I2510 Atherosclerotic heart disease of native coronary artery without angina pectoris: Secondary | ICD-10-CM | POA: Diagnosis not present

## 2023-04-23 DIAGNOSIS — E1151 Type 2 diabetes mellitus with diabetic peripheral angiopathy without gangrene: Secondary | ICD-10-CM | POA: Diagnosis not present

## 2023-04-23 DIAGNOSIS — N289 Disorder of kidney and ureter, unspecified: Secondary | ICD-10-CM | POA: Diagnosis not present

## 2023-04-23 DIAGNOSIS — E1121 Type 2 diabetes mellitus with diabetic nephropathy: Secondary | ICD-10-CM | POA: Diagnosis not present

## 2023-04-23 DIAGNOSIS — E782 Mixed hyperlipidemia: Secondary | ICD-10-CM | POA: Diagnosis not present

## 2023-04-23 DIAGNOSIS — I5032 Chronic diastolic (congestive) heart failure: Secondary | ICD-10-CM | POA: Diagnosis not present

## 2023-04-23 DIAGNOSIS — R296 Repeated falls: Secondary | ICD-10-CM | POA: Diagnosis not present

## 2023-04-23 DIAGNOSIS — I110 Hypertensive heart disease with heart failure: Secondary | ICD-10-CM | POA: Diagnosis not present

## 2023-04-25 DIAGNOSIS — N289 Disorder of kidney and ureter, unspecified: Secondary | ICD-10-CM | POA: Diagnosis not present

## 2023-04-25 DIAGNOSIS — I110 Hypertensive heart disease with heart failure: Secondary | ICD-10-CM | POA: Diagnosis not present

## 2023-04-25 DIAGNOSIS — I2510 Atherosclerotic heart disease of native coronary artery without angina pectoris: Secondary | ICD-10-CM | POA: Diagnosis not present

## 2023-04-25 DIAGNOSIS — E782 Mixed hyperlipidemia: Secondary | ICD-10-CM | POA: Diagnosis not present

## 2023-04-25 DIAGNOSIS — R296 Repeated falls: Secondary | ICD-10-CM | POA: Diagnosis not present

## 2023-04-25 DIAGNOSIS — M159 Polyosteoarthritis, unspecified: Secondary | ICD-10-CM | POA: Diagnosis not present

## 2023-04-25 DIAGNOSIS — E1151 Type 2 diabetes mellitus with diabetic peripheral angiopathy without gangrene: Secondary | ICD-10-CM | POA: Diagnosis not present

## 2023-04-25 DIAGNOSIS — I5032 Chronic diastolic (congestive) heart failure: Secondary | ICD-10-CM | POA: Diagnosis not present

## 2023-04-25 DIAGNOSIS — E1121 Type 2 diabetes mellitus with diabetic nephropathy: Secondary | ICD-10-CM | POA: Diagnosis not present

## 2023-04-30 DIAGNOSIS — I110 Hypertensive heart disease with heart failure: Secondary | ICD-10-CM | POA: Diagnosis not present

## 2023-04-30 DIAGNOSIS — E782 Mixed hyperlipidemia: Secondary | ICD-10-CM | POA: Diagnosis not present

## 2023-04-30 DIAGNOSIS — M159 Polyosteoarthritis, unspecified: Secondary | ICD-10-CM | POA: Diagnosis not present

## 2023-04-30 DIAGNOSIS — E1151 Type 2 diabetes mellitus with diabetic peripheral angiopathy without gangrene: Secondary | ICD-10-CM | POA: Diagnosis not present

## 2023-04-30 DIAGNOSIS — N289 Disorder of kidney and ureter, unspecified: Secondary | ICD-10-CM | POA: Diagnosis not present

## 2023-04-30 DIAGNOSIS — I2510 Atherosclerotic heart disease of native coronary artery without angina pectoris: Secondary | ICD-10-CM | POA: Diagnosis not present

## 2023-04-30 DIAGNOSIS — E1121 Type 2 diabetes mellitus with diabetic nephropathy: Secondary | ICD-10-CM | POA: Diagnosis not present

## 2023-04-30 DIAGNOSIS — R296 Repeated falls: Secondary | ICD-10-CM | POA: Diagnosis not present

## 2023-04-30 DIAGNOSIS — I5032 Chronic diastolic (congestive) heart failure: Secondary | ICD-10-CM | POA: Diagnosis not present

## 2023-05-01 DIAGNOSIS — I2510 Atherosclerotic heart disease of native coronary artery without angina pectoris: Secondary | ICD-10-CM | POA: Diagnosis not present

## 2023-05-01 DIAGNOSIS — E1151 Type 2 diabetes mellitus with diabetic peripheral angiopathy without gangrene: Secondary | ICD-10-CM | POA: Diagnosis not present

## 2023-05-01 DIAGNOSIS — M159 Polyosteoarthritis, unspecified: Secondary | ICD-10-CM | POA: Diagnosis not present

## 2023-05-01 DIAGNOSIS — E1121 Type 2 diabetes mellitus with diabetic nephropathy: Secondary | ICD-10-CM | POA: Diagnosis not present

## 2023-05-01 DIAGNOSIS — I5032 Chronic diastolic (congestive) heart failure: Secondary | ICD-10-CM | POA: Diagnosis not present

## 2023-05-01 DIAGNOSIS — I110 Hypertensive heart disease with heart failure: Secondary | ICD-10-CM | POA: Diagnosis not present

## 2023-05-01 DIAGNOSIS — N289 Disorder of kidney and ureter, unspecified: Secondary | ICD-10-CM | POA: Diagnosis not present

## 2023-05-01 DIAGNOSIS — R296 Repeated falls: Secondary | ICD-10-CM | POA: Diagnosis not present

## 2023-05-01 DIAGNOSIS — E782 Mixed hyperlipidemia: Secondary | ICD-10-CM | POA: Diagnosis not present

## 2023-05-07 DIAGNOSIS — E782 Mixed hyperlipidemia: Secondary | ICD-10-CM | POA: Diagnosis not present

## 2023-05-07 DIAGNOSIS — I110 Hypertensive heart disease with heart failure: Secondary | ICD-10-CM | POA: Diagnosis not present

## 2023-05-07 DIAGNOSIS — M159 Polyosteoarthritis, unspecified: Secondary | ICD-10-CM | POA: Diagnosis not present

## 2023-05-07 DIAGNOSIS — E1121 Type 2 diabetes mellitus with diabetic nephropathy: Secondary | ICD-10-CM | POA: Diagnosis not present

## 2023-05-07 DIAGNOSIS — I2510 Atherosclerotic heart disease of native coronary artery without angina pectoris: Secondary | ICD-10-CM | POA: Diagnosis not present

## 2023-05-07 DIAGNOSIS — E1151 Type 2 diabetes mellitus with diabetic peripheral angiopathy without gangrene: Secondary | ICD-10-CM | POA: Diagnosis not present

## 2023-05-07 DIAGNOSIS — R296 Repeated falls: Secondary | ICD-10-CM | POA: Diagnosis not present

## 2023-05-07 DIAGNOSIS — N289 Disorder of kidney and ureter, unspecified: Secondary | ICD-10-CM | POA: Diagnosis not present

## 2023-05-07 DIAGNOSIS — I5032 Chronic diastolic (congestive) heart failure: Secondary | ICD-10-CM | POA: Diagnosis not present

## 2023-05-08 DIAGNOSIS — S81801A Unspecified open wound, right lower leg, initial encounter: Secondary | ICD-10-CM | POA: Diagnosis not present

## 2023-05-13 ENCOUNTER — Ambulatory Visit: Payer: Self-pay

## 2023-05-13 NOTE — Patient Outreach (Signed)
  Care Coordination   Follow Up Visit Note   05/13/2023 Name: Heather [name] MRN: [id number] DOB: 1934/07/23  CLAIRISSA [name] is a 88 y.o. year old female who sees Bradd Canary, MD for primary care. I spoke with  [name] by phone today.  What matters to the patients health and wellness today?  Ms. Keatley reports she continues to see dermatology surgeon, Dr. Juanita Craver, regarding wounds to leg, which patient reports were cancerous areas that were removed. Ms. Melcher reports areas are healing. They states the areas do not bother her like they used to. She reports blood sugars have been ranging 98-109 and denies any concerns/issues with BS. She reports continues with home health PT, but reports this Thursday she will be discharged from HHPT. She reports concern that she may be having problems similar to why she had ventral hernia repair. She reports she is eating small amounts because that is all her stomach can hold. Ms. Giuliano expresses that she will contact surgeon if she continues to have problems.    Goals Addressed             This Visit's Progress    Care Coordination Activities-health management       Interventions Today    Flowsheet Row Most Recent Value  Chronic Disease   Chronic disease during today's visit Other, Diabetes, Hypertension (HTN)  [(per patient skin cancer right leg)]  General Interventions   General Interventions Discussed/Reviewed General Interventions Reviewed, Doctor Visits  [Evaluation of current treatment plan for health condition and patient's adherence to plan]  Doctor Visits Discussed/Reviewed Doctor Visits Reviewed, Specialist  PCP/Specialist Visits Compliance with follow-up visit  [last week appointment with dermatologist]  Education Interventions   Education Provided Provided Education  Provided Verbal Education On Medication, When to see the doctor, Blood Sugar Monitoring  [advised to continue to take medications as prescribed, attend provider visits as  scheduled, eat healthy to proote skin/health healling, advised to contact provider if condition worsens or does not improve.]  Nutrition Interventions   Nutrition Discussed/Reviewed Nutrition Reviewed  [encouraged healthy eating, small frequent meals if needed]  Pharmacy Interventions   Pharmacy Dicussed/Reviewed Pharmacy Topics Reviewed  [medication reviewe completed]  Safety Interventions   Safety Discussed/Reviewed Safety Discussed, Fall Risk  [discussed fall prevention strategies.]            SDOH assessments and interventions completed:  No  Care Coordination Interventions:  Yes, provided   Follow up plan: Follow up call scheduled for 06/12/23    Encounter Outcome:  Patient Visit Completed   Kathyrn Sheriff, RN, MSN, BSN, CCM Care Management Coordinator [phone]

## 2023-05-13 NOTE — Patient Instructions (Signed)
Visit Information  Thank you for taking time to visit with me today. Please don't hesitate to contact me if I can be of assistance to you.   Following are the goals we discussed today:  Continue to take medications as prescribed. Continue to attend provider visits as scheduled Continue to eat healthy, lean meats, vegetables, fruits, avoid saturated and transfats Contact provider with health questions or concerns as needed   Our next appointment is by telephone on 06/12/23 at 1:00 pm  Please call the care guide team at [phone] if you need to cancel or reschedule your appointment.   If you are experiencing a Mental Health or Behavioral Health Crisis or need someone to talk to, please call the Suicide and Crisis Lifeline: 988 call the [place] National Suicide Prevention Lifeline: 760-713-2967 or TTY: 818-440-2944 TTY (816) 881-8859) to talk to a trained counselor call 1-800-273-TALK (toll free, 24 hour hotline)   Kathyrn Sheriff, RN, MSN, BSN, CCM Care Management Coordinator [phone]

## 2023-05-14 DIAGNOSIS — E782 Mixed hyperlipidemia: Secondary | ICD-10-CM | POA: Diagnosis not present

## 2023-05-14 DIAGNOSIS — N289 Disorder of kidney and ureter, unspecified: Secondary | ICD-10-CM | POA: Diagnosis not present

## 2023-05-14 DIAGNOSIS — I5032 Chronic diastolic (congestive) heart failure: Secondary | ICD-10-CM | POA: Diagnosis not present

## 2023-05-14 DIAGNOSIS — M159 Polyosteoarthritis, unspecified: Secondary | ICD-10-CM | POA: Diagnosis not present

## 2023-05-14 DIAGNOSIS — E1151 Type 2 diabetes mellitus with diabetic peripheral angiopathy without gangrene: Secondary | ICD-10-CM | POA: Diagnosis not present

## 2023-05-14 DIAGNOSIS — I110 Hypertensive heart disease with heart failure: Secondary | ICD-10-CM | POA: Diagnosis not present

## 2023-05-14 DIAGNOSIS — E1121 Type 2 diabetes mellitus with diabetic nephropathy: Secondary | ICD-10-CM | POA: Diagnosis not present

## 2023-05-14 DIAGNOSIS — R296 Repeated falls: Secondary | ICD-10-CM | POA: Diagnosis not present

## 2023-05-14 DIAGNOSIS — I2510 Atherosclerotic heart disease of native coronary artery without angina pectoris: Secondary | ICD-10-CM | POA: Diagnosis not present

## 2023-05-20 ENCOUNTER — Ambulatory Visit: Payer: Self-pay | Admitting: Licensed Clinical Social Worker

## 2023-05-20 NOTE — Patient Instructions (Signed)
Visit Information  Thank you for taking time to visit with me today. Please don't hesitate to contact me if I can be of assistance to you.   Following are the goals we discussed today:   Goals Addressed             This Visit's Progress    Patient Stated she had recent procedure on her right leg (skin procedure)       Interventions:  Spoke with client via phone about client needs.  Client said she wraps and bandages two places on her right leg. She wraps areas daily. She said she is not having much pain with skin areas Discussed program support with RN, LCSW, Pharmacist Discussed client recent procedure on her right leg (skin procedure).  Discussed food procurement. She has no food issues at present. Her daughter, [name], helps client with client food needs Discussed transportation issues. She has transport help as needed. She drives on short errands and trips as needed  Discussed energy level. She has decreased energy at present Discussed family support with daughter and son. Her family is supportive Client has medical appointment with PCP in January of 2025 Provided counseling support Discussed mood issues of client. She feels that her mood is stable. She has support from son and daughter. Discussed vision of client. She did not mention any vision problems.  Encouraged client to call LCSW as needed for SW support at [phone].  Thanked client for phone call with LCSW Client was appreciative of call from LCSW today           Our next appointment is by telephone on 07/01/23 at 2:30 PM   Please call the care guide team at [phone] if you need to cancel or reschedule your appointment.   If you are experiencing a Mental Health or Behavioral Health Crisis or need someone to talk to, please go to [facility] [address], [place] [phone])   The patient verbalized understanding of instructions, educational materials, and care  plan provided today and DECLINED offer to receive copy of patient instructions, educational materials, and care plan.   The patient has been provided with contact information for the care management team and has been advised to call with any health related questions or concerns.   Kelton Pillar.Keen Ewalt MSW, LCSW Licensed Visual merchandiser [facility] Care Management [phone]

## 2023-05-20 NOTE — Patient Outreach (Signed)
  Care Coordination   Follow Up Visit Note   05/20/2023 Name: Heather [name] MRN: [id number] DOB: 13-Dec-1934  Heather [name] is a 88 y.o. year old female who sees Heather Canary, MD for primary care. I spoke with  Heather [name] by phone today.  What matters to the patients health and wellness today?  Patient had procedure  on her right leg (skin procedure) she is recovering from skin procedure on right leg    Goals Addressed             This Visit's Progress    Patient Stated she had recent procedure on her right leg (skin procedure)       Interventions:  Spoke with client via phone about client needs.  Client said she wraps and bandages two places on her right leg. She wraps areas daily. She said she is not having much pain with skin areas Discussed program support with RN, LCSW, Pharmacist Discussed client recent procedure on her right leg (skin procedure).  Discussed food procurement. She has no food issues at present. Her daughter, Heather [name], helps client with client food needs Discussed transportation issues. She has transport help as needed. She drives on short errands and trips as needed  Discussed energy level. She has decreased energy at present Discussed family support with daughter and son. Her family is supportive Client has medical appointment with PCP in January of 2025 Provided counseling support Discussed mood issues of client. She feels that her mood is stable. She has support from son and daughter. Discussed vision of client. She did not mention any vision problems.  Encouraged client to call LCSW as needed for SW support at [phone].  Thanked client for phone call with LCSW Client was appreciative of call from LCSW today           SDOH assessments and interventions completed:  Yes  SDOH Interventions Today    Flowsheet Row Most Recent Value  SDOH Interventions   Depression Interventions/Treatment  Counseling  Physical Activity Interventions Other  (Comments)  [client has decreased energy in walking occasionally]  Stress Interventions Provide Counseling  [client has occasional stress related to managing medical needs]        Care Coordination Interventions:  Yes, provided   Interventions Today    Flowsheet Row Most Recent Value  Chronic Disease   Chronic disease during today's visit Other  [spoke with client about client needs]  General Interventions   General Interventions Discussed/Reviewed General Interventions Discussed, Community Resources  Education Interventions   Education Provided Provided Education  Provided Engineer, [employer]  Mental Health Interventions   Mental Health Discussed/Reviewed Coping Strategies  [discussed coping skills. She likes to watch TV. She talks via phone with friends regularly]  Nutrition Interventions   Nutrition Discussed/Reviewed Nutrition Discussed  [she said appetite is improving]  Pharmacy Interventions   Pharmacy Dicussed/Reviewed Pharmacy Topics Discussed  Safety Interventions   Safety Discussed/Reviewed Fall Risk        Follow up plan: Follow up call scheduled for 07/01/23 at 2:30 PM     Encounter Outcome:  Patient Visit Completed   Kelton Pillar.Fareeha Evon MSW, LCSW Licensed Visual merchandiser [facility] Care Management [phone]

## 2023-05-23 ENCOUNTER — Other Ambulatory Visit (HOSPITAL_COMMUNITY): Payer: Self-pay | Admitting: Medical

## 2023-05-23 ENCOUNTER — Ambulatory Visit (HOSPITAL_COMMUNITY)
Admission: RE | Admit: 2023-05-23 | Discharge: 2023-05-23 | Disposition: A | Discharge status: Home / Self Care | Payer: Medicare HMO | Source: Ambulatory Visit | Attending: Medical | Admitting: Medical

## 2023-05-23 DIAGNOSIS — Z08 Encounter for follow-up examination after completed treatment for malignant neoplasm: Secondary | ICD-10-CM | POA: Diagnosis not present

## 2023-05-23 DIAGNOSIS — M79604 Pain in right leg: Secondary | ICD-10-CM

## 2023-05-23 DIAGNOSIS — Z85828 Personal history of other malignant neoplasm of skin: Secondary | ICD-10-CM | POA: Diagnosis not present

## 2023-05-23 DIAGNOSIS — L578 Other skin changes due to chronic exposure to nonionizing radiation: Secondary | ICD-10-CM | POA: Diagnosis not present

## 2023-05-23 DIAGNOSIS — M25571 Pain in right ankle and joints of right foot: Secondary | ICD-10-CM | POA: Diagnosis not present

## 2023-05-23 NOTE — Progress Notes (Signed)
Right lower extremity venous duplex has been completed. Preliminary results can be found in CV Proc through chart review.  Results were given to Harlene Salts PA.  05/23/23 2:21 PM Olen Cordial RVT

## 2023-05-27 DIAGNOSIS — S86391A Other injury of muscle(s) and tendon(s) of peroneal muscle group at lower leg level, right leg, initial encounter: Secondary | ICD-10-CM | POA: Diagnosis not present

## 2023-06-09 DIAGNOSIS — M25571 Pain in right ankle and joints of right foot: Secondary | ICD-10-CM | POA: Diagnosis not present

## 2023-06-12 ENCOUNTER — Ambulatory Visit: Payer: Self-pay

## 2023-06-12 NOTE — Patient Instructions (Signed)
Visit Information  Thank you for taking time to visit with me today. Please don't hesitate to contact me if I can be of assistance to you.   Following are the goals we discussed today:  Continue to take medications as prescribed. Continue to attend provider visits as scheduled Continue to eat healthy, lean meats, vegetables, fruits, avoid saturated and transfats Contact provider with health questions or concerns as needed Continue to check blood sugar as recommended and notify provider if questions or concerns  Our next appointment is by telephone on 08/05/22 at 11:30 am  Please call the care guide team at [phone] if you need to cancel or reschedule your appointment.   If you are experiencing a Mental Health or Behavioral Health Crisis or need someone to talk to, please call the Suicide and Crisis Lifeline: 988 call the [place] National Suicide Prevention Lifeline: 541-022-8148 or TTY: 701-387-9041 TTY (409)373-7871) to talk to a trained counselor   Kathyrn Sheriff, RN, MSN, BSN, CCM Care Management Coordinator [phone]

## 2023-06-12 NOTE — Patient Outreach (Signed)
  Care Coordination   Follow Up Visit Note   06/12/2023 Name: Heather [name] MRN: [id number] DOB: 12-Jun-1935  Heather [name] is a 88 y.o. year old female who sees Heather Canary, MD for primary care. I spoke with  Heather [name] by phone today.  What matters to the patients health and wellness today?  Heather [name] reports she is doing well. Continues to see dermatologist twice a month regarding slow healing wound to right leg. She reports blood sugar this morning 88 and denies any questions at this time or resource needs at this time.  Goals Addressed             This Visit's Progress    Care Coordination Activities-health management       Interventions Today    Flowsheet Row Most Recent Value  Chronic Disease   Chronic disease during today's visit Diabetes, Other, Hypertension (HTN)  [(per patient skin cancer right leg removed/slow healing wound))]  General Interventions   General Interventions Discussed/Reviewed General Interventions Reviewed  Education Interventions   Education Provided Provided Education  [discussed importance of maintaining good blood sugar control and nutrition in wound healing]  Provided Verbal Education On Exercise, Medication, When to see the doctor, Nutrition  Nutrition Interventions   Nutrition Discussed/Reviewed Nutrition Reviewed  Pharmacy Interventions   Pharmacy Dicussed/Reviewed Pharmacy Topics Reviewed            SDOH assessments and interventions completed:  No  Care Coordination Interventions:  Yes, provided   Follow up plan: Follow up call scheduled for 08/06/23    Encounter Outcome:  Patient Visit Completed   Kathyrn Sheriff, RN, MSN, BSN, CCM Care Management Coordinator [phone]

## 2023-06-14 ENCOUNTER — Other Ambulatory Visit: Payer: Self-pay | Admitting: Family Medicine

## 2023-06-14 DIAGNOSIS — S86391A Other injury of muscle(s) and tendon(s) of peroneal muscle group at lower leg level, right leg, initial encounter: Secondary | ICD-10-CM | POA: Diagnosis not present

## 2023-06-14 DIAGNOSIS — R2241 Localized swelling, mass and lump, right lower limb: Secondary | ICD-10-CM | POA: Diagnosis not present

## 2023-06-18 DIAGNOSIS — S81801A Unspecified open wound, right lower leg, initial encounter: Secondary | ICD-10-CM | POA: Diagnosis not present

## 2023-07-01 ENCOUNTER — Ambulatory Visit: Payer: Self-pay | Admitting: Licensed Clinical Social Worker

## 2023-07-01 NOTE — Patient Outreach (Signed)
  Care Coordination   Follow Up Visit Note   07/01/2023 Name: Heather [name] MRN: [id number] DOB: 1934-10-11  Heather [name] is a 88 y.o. year old female who sees Bradd Canary, MD for primary care. I spoke with  [name] by phone today.  What matters to the patients health and wellness today?  Patient Stated she is recovering from a recent procedure on her right leg (skin procedure)    Goals Addressed             This Visit's Progress    Patient Stated she is recovering from a recent procedure on her right leg (skin procedure)       Interventions:  Spoke with client via phone about client needs and status Client has been receiving scheduled wound care to wound areas on her right leg Client does drive her car as needed to complete short trips or errands or to attend medical appointments.  Discussed program support with RN, LCSW, Pharmacist Client has family support with her daughter and with her son.  Phone connection with client dropped 2X during LCSW phone calls with client today. LCSW called client phone number and left phone message encouraging client to call LCSW as needed at [phone] for SW support for client         SDOH assessments and interventions completed:  Yes  SDOH Interventions Today    Flowsheet Row Most Recent Value  SDOH Interventions   Depression Interventions/Treatment  Counseling  Physical Activity Interventions Other (Comments)  [decreased activity]  Stress Interventions Other (Comment)  [has stress related to managing medical needs]        Care Coordination Interventions:  Yes, provided   Interventions Today    Flowsheet Row Most Recent Value  Chronic Disease   Chronic disease during today's visit Other  [spoke with client about client needs]  General Interventions   General Interventions Discussed/Reviewed General Interventions Discussed, Community Resources  Education Interventions   Education Provided Provided Education   Provided Verbal Education On [employer]  Mental Health Interventions   Mental Health Discussed/Reviewed Coping Strategies  [client uses coping skills to manage anxiety and stress issues faced]        Follow up plan: Follow up call scheduled for 09/04/23 at 10:30 AM    Encounter Outcome:  Patient Visit Completed   Kelton Pillar.Suzy Kugel MSW, LCSW Licensed Visual merchandiser [facility] Care Management [phone]

## 2023-07-01 NOTE — Patient Instructions (Signed)
Visit Information  Thank you for taking time to visit with me today. Please don't hesitate to contact me if I can be of assistance to you.   Following are the goals we discussed today:   Goals Addressed             This Visit's Progress    Patient Stated she is recovering from a recent procedure on her right leg (skin procedure)       Interventions:  Spoke with client via phone about client needs and status Client has been receiving scheduled wound care to wound areas on her right leg Client does drive her car as needed to complete short trips or errands or to attend medical appointments.  Discussed program support with RN, LCSW, Pharmacist Client has family support with her daughter and with her son.  Phone connection with client dropped 2X during LCSW phone calls with client today. LCSW called client phone number and left phone message encouraging client to call LCSW as needed at [phone] for SW support for client         Our next appointment is by telephone on 09/04/23 at 10:30 AM   Please call the care guide team at [phone] if you need to cancel or reschedule your appointment.   If you are experiencing a Mental Health or Behavioral Health Crisis or need someone to talk to, please go to [facility] [address] [place] [phone])   The patient verbalized understanding of instructions, educational materials, and care plan provided today and DECLINED offer to receive copy of patient instructions, educational materials, and care plan.   The patient has been provided with contact information for the care management team and has been advised to call with any health related questions or concerns.   Kelton Pillar.Cynia Abruzzo MSW, LCSW Licensed Visual merchandiser [facility] [phone]

## 2023-07-06 DIAGNOSIS — M25572 Pain in left ankle and joints of left foot: Secondary | ICD-10-CM | POA: Diagnosis not present

## 2023-07-22 DIAGNOSIS — S81801A Unspecified open wound, right lower leg, initial encounter: Secondary | ICD-10-CM | POA: Diagnosis not present

## 2023-08-05 ENCOUNTER — Ambulatory Visit: Payer: Medicare HMO | Admitting: Family Medicine

## 2023-08-05 ENCOUNTER — Ambulatory Visit (INDEPENDENT_AMBULATORY_CARE_PROVIDER_SITE_OTHER): Payer: Medicare HMO | Admitting: Physician Assistant

## 2023-08-05 VITALS — BP 182/78 | HR 67 | Temp 97.7°F | Ht 62.0 in | Wt 142.2 lb

## 2023-08-05 DIAGNOSIS — I5032 Chronic diastolic (congestive) heart failure: Secondary | ICD-10-CM

## 2023-08-05 DIAGNOSIS — E1121 Type 2 diabetes mellitus with diabetic nephropathy: Secondary | ICD-10-CM | POA: Diagnosis not present

## 2023-08-05 DIAGNOSIS — E1159 Type 2 diabetes mellitus with other circulatory complications: Secondary | ICD-10-CM

## 2023-08-05 DIAGNOSIS — R21 Rash and other nonspecific skin eruption: Secondary | ICD-10-CM

## 2023-08-05 DIAGNOSIS — I152 Hypertension secondary to endocrine disorders: Secondary | ICD-10-CM | POA: Diagnosis not present

## 2023-08-05 LAB — COMPREHENSIVE METABOLIC PANEL
ALT: 15 U/L (ref 0–35)
AST: 20 U/L (ref 0–37)
Albumin: 4.6 g/dL (ref 3.5–5.2)
Alkaline Phosphatase: 107 U/L (ref 39–117)
BUN: 22 mg/dL (ref 6–23)
CO2: 28 meq/L (ref 19–32)
Calcium: 9.5 mg/dL (ref 8.4–10.5)
Chloride: 105 meq/L (ref 96–112)
Creatinine, Ser: 0.8 mg/dL (ref 0.40–1.20)
GFR: 66.53 mL/min (ref >60.00)
Glucose, Bld: 103 mg/dL — ABNORMAL HIGH (ref 70–99)
Potassium: 4 meq/L (ref 3.5–5.1)
Sodium: 141 meq/L (ref 135–145)
Total Bilirubin: 0.5 mg/dL (ref 0.2–1.2)
Total Protein: 6.7 g/dL (ref 6.0–8.3)

## 2023-08-05 LAB — MICROALBUMIN / CREATININE URINE RATIO
Creatinine,U: 92.3 mg/dL
Microalb Creat Ratio: 42.6 mg/g — ABNORMAL HIGH (ref 0.0–30.0)
Microalb, Ur: 39.3 mg/dL — ABNORMAL HIGH (ref 0.0–1.9)

## 2023-08-05 LAB — SEDIMENTATION RATE: Sed Rate: 6 mm/h (ref 0–30)

## 2023-08-05 LAB — C-REACTIVE PROTEIN: CRP: <1 mg/dL (ref 0.5–20.0)

## 2023-08-05 LAB — HEMOGLOBIN A1C: Hgb A1c MFr Bld: 6.8 % — ABNORMAL HIGH (ref 4.6–6.5)

## 2023-08-05 NOTE — Assessment & Plan Note (Addendum)
Elevated, typically controlled Manages with imdur, losartan 100 mg, metop 12.5 mg , amlodipine 10 mg Pt forgot meds today F/b earlier if consistently elevated at home

## 2023-08-05 NOTE — Assessment & Plan Note (Signed)
Well controlled, last A1c 6.7% Repeat A1c today Manages with diet/exercise On arb, statin, uacr utd-- given leg rash, will repeat  Foot exam needed  F/u 4-6 mo

## 2023-08-05 NOTE — Progress Notes (Signed)
Established patient visit   Patient: Heather [name]   DOB: 1935/04/28   89 y.o. Female  MRN: [id number] Visit Date: 08/05/2023  Today's healthcare provider: Alfredia Ferguson, PA-C   Cc. Follow up, leg lesions  Subjective      Pt has concerned over leg lesions-- red spots that appeared on her lower legs a few months/weeks ago. She has seen dermatology who recommended getting her kidneys checked.   Medications: Outpatient Medications Prior to Visit  Medication Sig   acetaminophen (TYLENOL) 500 MG tablet Take 1,000 mg by mouth every 6 (six) hours as needed for moderate pain.   amLODipine (NORVASC) 10 MG tablet TAKE 1 TABLET EVERY DAY   aspirin EC 81 MG tablet Take 1 tablet (81 mg total) by mouth daily.   Blood Glucose Monitoring Suppl (TRUE METRIX AIR GLUCOSE METER) w/Device KIT USE TO CHECK BLOOD SUGAR ONCE DAILY AND AS NEEDED.  DX CODE E11.9   diclofenac Sodium (VOLTAREN) 1 % GEL Apply 1 application topically 4 (four) times daily as needed (hip pain).   estradiol (ESTRACE) 0.1 MG/GM vaginal cream Place 0.5 g vaginally 2 (two) times a week. Place 0.5g nightly for two weeks then twice a week after   famotidine (PEPCID) 20 MG tablet TAKE 1 TABLET AT BEDTIME AS NEEDED FOR HEARTBURN OR INDIGESTION.   glucose blood (TRUE METRIX BLOOD GLUCOSE TEST) test strip TEST BLOOD SUGAR EVERY DAY  OR AS NEEDED   Glycerin, PF, (OPTASE COMFORT DRY EYE) 1 % SOLN Place 1 drop into both eyes in the morning and at bedtime.   isosorbide mononitrate (IMDUR) 60 MG 24 hr tablet TAKE 1 AND 1/2 TABLETS EVERY DAY   losartan (COZAAR) 100 MG tablet TAKE 1 TABLET EVERY DAY   Magnesium Oxide 250 MG TABS Take 250 mg by mouth every morning.   metoprolol succinate (TOPROL-XL) 25 MG 24 hr tablet TAKE 1/2 TABLET EVERY DAY   Olopatadine HCl (PATADAY) 0.2 % SOLN Place 1 drop into both eyes daily.   omeprazole (PRILOSEC) 20 MG capsule TAKE 1 CAPSULE EVERY DAY AS NEEDED (NEED OFFICE VISIT BEFORE ANY FURTHER REFILLS)    pramipexole (MIRAPEX) 0.25 MG tablet TAKE 1 TABLET TWICE DAILY   simvastatin (ZOCOR) 20 MG tablet TAKE 1 TABLET AT BEDTIME   TRUEplus Lancets 33G MISC USE TO CHECK BLOOD SUGAR ONCE DAILY AND AS NEEDED.  DX CODE: E11.9   Vibegron 75 MG TABS Take 1 tablet (75 mg total) by mouth daily.   [DISCONTINUED] cyclobenzaprine (FLEXERIL) 5 MG tablet Take 1 tablet (5 mg total) by mouth 3 (three) times daily as needed for muscle spasms.   No facility-administered medications prior to visit.    Review of Systems  Constitutional:  Negative for fatigue and fever.  Respiratory:  Negative for cough and shortness of breath.   Cardiovascular:  Negative for chest pain and leg swelling.  Gastrointestinal:  Negative for abdominal pain.  Skin:  Positive for rash.  Neurological:  Negative for dizziness and headaches.       Objective    BP (!) 182/78   Pulse 67   Temp 97.7 F (36.5 C) (Oral)   Ht 5\' 2"  (1.575 m)   Wt 142 lb 4 oz (64.5 kg)   LMP  (LMP Unknown)   SpO2 97%   BMI 26.02 kg/m    Physical Exam Constitutional:      General: She is awake.     Appearance: She is well-developed.  HENT:  Head: Normocephalic.  Eyes:     Conjunctiva/sclera: Conjunctivae normal.  Cardiovascular:     Rate and Rhythm: Normal rate and regular rhythm.     Heart sounds: Normal heart sounds.  Pulmonary:     Effort: Pulmonary effort is normal.     Breath sounds: Normal breath sounds.  Skin:    General: Skin is warm.     Comments: B/l LE there is an erythematous large macular rash, splotches. -- Similar to purpura but seem too erythematous  Neurological:     Mental Status: She is alert and oriented to person, place, and time.  Psychiatric:        Attention and Perception: Attention normal.        Mood and Affect: Mood normal.        Speech: Speech normal.        Behavior: Behavior is cooperative.      No results found for any visits on 08/05/23.  Assessment & Plan    Chronic diastolic heart failure  (HCC) Assessment & Plan: Pt appears euvolemic today   Hypertension associated with diabetes (HCC) Assessment & Plan: Elevated, typically controlled Manages with imdur, losartan 100 mg, metop 12.5 mg , amlodipine 10 mg Pt forgot meds today F/b earlier if consistently elevated at home  Orders: -     Comprehensive metabolic panel  Type 2 diabetes mellitus with diabetic nephropathy, without long-term current use of insulin (HCC) Assessment & Plan: Well controlled, last A1c 6.7% Repeat A1c today Manages with diet/exercise On arb, statin, uacr utd-- given leg rash, will repeat  Foot exam needed  F/u 4-6 mo  Orders: -     Comprehensive metabolic panel -     Hemoglobin A1c -     Microalbumin / creatinine urine ratio  Rash  See included photo. Will check cmp, uacr, crp, esr . I'm assuming derm was concerned over a glomerulonephritis like rash. No recent illnesses .   Return in about 4 months (around 12/03/2023) for hypertension.       Alfredia Ferguson, PA-C  [facility] at [facility] [phone] (phone) [phone] (fax)  [facility]

## 2023-08-05 NOTE — Assessment & Plan Note (Signed)
Pt appears euvolemic today

## 2023-08-06 ENCOUNTER — Telehealth: Payer: Self-pay

## 2023-08-06 ENCOUNTER — Ambulatory Visit: Payer: Self-pay

## 2023-08-06 NOTE — Patient Instructions (Signed)
Visit Information  Thank you for taking time to visit with me today. Please don't hesitate to contact me if I can be of assistance to you.   Following are the goals we discussed today:  Continue to take medications as prescribed. Continue to attend provider visits as scheduled Continue to eat healthy, lean meats, vegetables, fruits, avoid saturated and transfats Contact provider with health questions or concerns as needed   Our next appointment is by telephone on 10/03/22 at 11:00 am  Please call the care guide team at [phone] if you need to cancel or reschedule your appointment.   If you are experiencing a Mental Health or Behavioral Health Crisis or need someone to talk to, please call the Suicide and Crisis Lifeline: 988 call the [place] National Suicide Prevention Lifeline: 878-878-5876 or TTY: 386-656-5711 TTY 916-326-5891) to talk to a trained counselor   Kathyrn Sheriff, RN, MSN, BSN, CCM [place]  [facility], Population Health Case Manager Phone: [phone]

## 2023-08-06 NOTE — Patient Outreach (Signed)
  Care Coordination   Follow Up Visit Note   08/06/2023 Name: Heather [name] MRN: [id number] DOB: Jun 03, 1935  Heather [name] is a 89 y.o. year old female who sees Bradd Canary, MD for primary care. I spoke with  [name] by phone today.  What matters to the patients health and wellness today?  Heather [name] reports areas on her leg are healing. She states she is doing ok. But expresses that she recently began having problems with hernia and reports had hernia repair surgery in August of 2024. She states she will contact her surgeon to notify. She is without any other questions or concerns at this time.  Goals Addressed             This Visit's Progress    Care Coordination Activities-health management       Interventions Today    Flowsheet Row Most Recent Value  Chronic Disease   Chronic disease during today's visit Diabetes, Hypertension (HTN), Other  [per patient skin cancer right leg removed-slow healing wound]  General Interventions   General Interventions Discussed/Reviewed General Interventions Reviewed  [Evaluation of current treatment plan for health condition and patient's adherence to plan.]  Education Interventions   Education Provided Provided Education  Provided Verbal Education On Blood Sugar Monitoring, Medication, When to see the doctor, Other  [advised to take medications as prescribed, attend provider visits as scheduled. advised to contact provider with health questions/concerns as needed]  Pharmacy Interventions   Pharmacy Dicussed/Reviewed Pharmacy Topics Discussed, Pharmacy Topics Reviewed            SDOH assessments and interventions completed:  Yes  SDOH Interventions Today    Flowsheet Row Most Recent Value  SDOH Interventions   Utilities Interventions Intervention Not Indicated      Care Coordination Interventions:  Yes, provided   Follow up plan: Follow up call scheduled for 10/03/22    Encounter Outcome:  Patient Visit Completed    Kathyrn Sheriff, RN, MSN, BSN, CCM [place]  [facility], Population Health Case Manager Phone: [phone]

## 2023-08-06 NOTE — Telephone Encounter (Signed)
Called and LVM for patient to call us and let us know who Derm is

## 2023-08-06 NOTE — Telephone Encounter (Signed)
-----   Message from Heather [name] sent at 08/06/2023 10:28 AM EST ----- Can you find out who her dermatologist is? To touch base about the rash

## 2023-08-06 NOTE — Telephone Encounter (Signed)
Copied from CRM [id number]. Topic: General - Call Back - No Documentation >> Aug 06, 2023 11:03 AM Elizebeth Brooking wrote: Reason for CRM: Patient called back regarding missed call from [facility]. Asked patient what  Dermatology she went to per note she stated it was Dr.Arthur Wallace Cullens at [facility] [phone]

## 2023-08-07 ENCOUNTER — Other Ambulatory Visit: Payer: Self-pay | Admitting: Physician Assistant

## 2023-08-07 ENCOUNTER — Ambulatory Visit (HOSPITAL_BASED_OUTPATIENT_CLINIC_OR_DEPARTMENT_OTHER)
Admission: RE | Admit: 2023-08-07 | Discharge: 2023-08-07 | Disposition: A | Discharge status: Home / Self Care | Payer: Medicare HMO | Source: Ambulatory Visit | Attending: General Surgery | Admitting: General Surgery

## 2023-08-07 ENCOUNTER — Other Ambulatory Visit (HOSPITAL_BASED_OUTPATIENT_CLINIC_OR_DEPARTMENT_OTHER): Payer: Self-pay | Admitting: General Surgery

## 2023-08-07 DIAGNOSIS — R809 Proteinuria, unspecified: Secondary | ICD-10-CM

## 2023-08-07 DIAGNOSIS — R109 Unspecified abdominal pain: Secondary | ICD-10-CM | POA: Diagnosis not present

## 2023-08-07 DIAGNOSIS — E782 Mixed hyperlipidemia: Secondary | ICD-10-CM | POA: Diagnosis present

## 2023-08-07 DIAGNOSIS — Z8249 Family history of ischemic heart disease and other diseases of the circulatory system: Secondary | ICD-10-CM | POA: Diagnosis not present

## 2023-08-07 DIAGNOSIS — Z9889 Other specified postprocedural states: Secondary | ICD-10-CM | POA: Insufficient documentation

## 2023-08-07 DIAGNOSIS — Z8719 Personal history of other diseases of the digestive system: Secondary | ICD-10-CM | POA: Insufficient documentation

## 2023-08-07 DIAGNOSIS — R7401 Elevation of levels of liver transaminase levels: Secondary | ICD-10-CM | POA: Diagnosis present

## 2023-08-07 DIAGNOSIS — I110 Hypertensive heart disease with heart failure: Secondary | ICD-10-CM | POA: Diagnosis present

## 2023-08-07 DIAGNOSIS — K2970 Gastritis, unspecified, without bleeding: Secondary | ICD-10-CM | POA: Diagnosis not present

## 2023-08-07 DIAGNOSIS — I700 Atherosclerosis of aorta: Secondary | ICD-10-CM | POA: Diagnosis not present

## 2023-08-07 DIAGNOSIS — I499 Cardiac arrhythmia, unspecified: Secondary | ICD-10-CM | POA: Diagnosis not present

## 2023-08-07 DIAGNOSIS — M810 Age-related osteoporosis without current pathological fracture: Secondary | ICD-10-CM | POA: Diagnosis present

## 2023-08-07 DIAGNOSIS — E119 Type 2 diabetes mellitus without complications: Secondary | ICD-10-CM | POA: Diagnosis present

## 2023-08-07 DIAGNOSIS — R1013 Epigastric pain: Secondary | ICD-10-CM | POA: Diagnosis present

## 2023-08-07 DIAGNOSIS — R1084 Generalized abdominal pain: Secondary | ICD-10-CM | POA: Diagnosis not present

## 2023-08-07 DIAGNOSIS — G8929 Other chronic pain: Secondary | ICD-10-CM | POA: Diagnosis present

## 2023-08-07 DIAGNOSIS — Z85828 Personal history of other malignant neoplasm of skin: Secondary | ICD-10-CM | POA: Diagnosis not present

## 2023-08-07 DIAGNOSIS — E1121 Type 2 diabetes mellitus with diabetic nephropathy: Secondary | ICD-10-CM | POA: Diagnosis not present

## 2023-08-07 DIAGNOSIS — N281 Cyst of kidney, acquired: Secondary | ICD-10-CM | POA: Diagnosis not present

## 2023-08-07 DIAGNOSIS — G2581 Restless legs syndrome: Secondary | ICD-10-CM | POA: Diagnosis present

## 2023-08-07 DIAGNOSIS — R7989 Other specified abnormal findings of blood chemistry: Secondary | ICD-10-CM | POA: Diagnosis not present

## 2023-08-07 DIAGNOSIS — K439 Ventral hernia without obstruction or gangrene: Secondary | ICD-10-CM | POA: Diagnosis not present

## 2023-08-07 DIAGNOSIS — K920 Hematemesis: Secondary | ICD-10-CM | POA: Diagnosis present

## 2023-08-07 DIAGNOSIS — R079 Chest pain, unspecified: Secondary | ICD-10-CM | POA: Diagnosis not present

## 2023-08-07 DIAGNOSIS — Z8541 Personal history of malignant neoplasm of cervix uteri: Secondary | ICD-10-CM | POA: Diagnosis not present

## 2023-08-07 DIAGNOSIS — I2510 Atherosclerotic heart disease of native coronary artery without angina pectoris: Secondary | ICD-10-CM | POA: Diagnosis present

## 2023-08-07 DIAGNOSIS — Z955 Presence of coronary angioplasty implant and graft: Secondary | ICD-10-CM | POA: Diagnosis not present

## 2023-08-07 DIAGNOSIS — Z86718 Personal history of other venous thrombosis and embolism: Secondary | ICD-10-CM | POA: Diagnosis not present

## 2023-08-07 DIAGNOSIS — Z961 Presence of intraocular lens: Secondary | ICD-10-CM | POA: Diagnosis present

## 2023-08-07 DIAGNOSIS — K5730 Diverticulosis of large intestine without perforation or abscess without bleeding: Secondary | ICD-10-CM | POA: Diagnosis not present

## 2023-08-07 DIAGNOSIS — K319 Disease of stomach and duodenum, unspecified: Secondary | ICD-10-CM | POA: Diagnosis not present

## 2023-08-07 DIAGNOSIS — Z7982 Long term (current) use of aspirin: Secondary | ICD-10-CM | POA: Diagnosis not present

## 2023-08-07 DIAGNOSIS — K449 Diaphragmatic hernia without obstruction or gangrene: Secondary | ICD-10-CM | POA: Diagnosis present

## 2023-08-07 DIAGNOSIS — I5030 Unspecified diastolic (congestive) heart failure: Secondary | ICD-10-CM | POA: Diagnosis not present

## 2023-08-07 DIAGNOSIS — Z96651 Presence of right artificial knee joint: Secondary | ICD-10-CM | POA: Diagnosis present

## 2023-08-07 DIAGNOSIS — K219 Gastro-esophageal reflux disease without esophagitis: Secondary | ICD-10-CM | POA: Diagnosis present

## 2023-08-07 DIAGNOSIS — R748 Abnormal levels of other serum enzymes: Secondary | ICD-10-CM | POA: Diagnosis present

## 2023-08-07 DIAGNOSIS — I10 Essential (primary) hypertension: Secondary | ICD-10-CM | POA: Diagnosis not present

## 2023-08-07 DIAGNOSIS — R0789 Other chest pain: Secondary | ICD-10-CM | POA: Diagnosis not present

## 2023-08-07 DIAGNOSIS — Z833 Family history of diabetes mellitus: Secondary | ICD-10-CM | POA: Diagnosis not present

## 2023-08-07 DIAGNOSIS — I452 Bifascicular block: Secondary | ICD-10-CM | POA: Diagnosis present

## 2023-08-07 DIAGNOSIS — K921 Melena: Secondary | ICD-10-CM | POA: Diagnosis not present

## 2023-08-07 DIAGNOSIS — R300 Dysuria: Secondary | ICD-10-CM | POA: Diagnosis not present

## 2023-08-07 DIAGNOSIS — E785 Hyperlipidemia, unspecified: Secondary | ICD-10-CM | POA: Diagnosis not present

## 2023-08-07 DIAGNOSIS — I5032 Chronic diastolic (congestive) heart failure: Secondary | ICD-10-CM | POA: Diagnosis present

## 2023-08-07 DIAGNOSIS — I4510 Unspecified right bundle-branch block: Secondary | ICD-10-CM | POA: Diagnosis not present

## 2023-08-08 ENCOUNTER — Other Ambulatory Visit: Payer: Self-pay

## 2023-08-08 ENCOUNTER — Emergency Department (HOSPITAL_COMMUNITY): Payer: Medicare HMO

## 2023-08-08 ENCOUNTER — Inpatient Hospital Stay (HOSPITAL_COMMUNITY)
Admission: EM | Admit: 2023-08-08 | Discharge: 2023-08-12 | DRG: 378 | Disposition: A | Discharge status: Home / Self Care | Payer: Medicare HMO | Attending: Family Medicine | Admitting: Family Medicine

## 2023-08-08 ENCOUNTER — Encounter (HOSPITAL_COMMUNITY): Payer: Self-pay | Admitting: Emergency Medicine

## 2023-08-08 DIAGNOSIS — R7989 Other specified abnormal findings of blood chemistry: Secondary | ICD-10-CM

## 2023-08-08 DIAGNOSIS — Z888 Allergy status to other drugs, medicaments and biological substances status: Secondary | ICD-10-CM

## 2023-08-08 DIAGNOSIS — R079 Chest pain, unspecified: Secondary | ICD-10-CM | POA: Diagnosis not present

## 2023-08-08 DIAGNOSIS — Z90710 Acquired absence of both cervix and uterus: Secondary | ICD-10-CM

## 2023-08-08 DIAGNOSIS — Z85828 Personal history of other malignant neoplasm of skin: Secondary | ICD-10-CM

## 2023-08-08 DIAGNOSIS — R1013 Epigastric pain: Secondary | ICD-10-CM | POA: Diagnosis present

## 2023-08-08 DIAGNOSIS — R109 Unspecified abdominal pain: Secondary | ICD-10-CM | POA: Diagnosis present

## 2023-08-08 DIAGNOSIS — I5032 Chronic diastolic (congestive) heart failure: Secondary | ICD-10-CM | POA: Diagnosis present

## 2023-08-08 DIAGNOSIS — Z86718 Personal history of other venous thrombosis and embolism: Secondary | ICD-10-CM

## 2023-08-08 DIAGNOSIS — I700 Atherosclerosis of aorta: Secondary | ICD-10-CM | POA: Diagnosis not present

## 2023-08-08 DIAGNOSIS — Z9842 Cataract extraction status, left eye: Secondary | ICD-10-CM

## 2023-08-08 DIAGNOSIS — R7401 Elevation of levels of liver transaminase levels: Secondary | ICD-10-CM | POA: Diagnosis not present

## 2023-08-08 DIAGNOSIS — I2510 Atherosclerotic heart disease of native coronary artery without angina pectoris: Secondary | ICD-10-CM | POA: Diagnosis present

## 2023-08-08 DIAGNOSIS — Z882 Allergy status to sulfonamides status: Secondary | ICD-10-CM

## 2023-08-08 DIAGNOSIS — Z961 Presence of intraocular lens: Secondary | ICD-10-CM | POA: Diagnosis present

## 2023-08-08 DIAGNOSIS — I10 Essential (primary) hypertension: Secondary | ICD-10-CM | POA: Diagnosis present

## 2023-08-08 DIAGNOSIS — G2581 Restless legs syndrome: Secondary | ICD-10-CM | POA: Diagnosis present

## 2023-08-08 DIAGNOSIS — Z9841 Cataract extraction status, right eye: Secondary | ICD-10-CM

## 2023-08-08 DIAGNOSIS — G8929 Other chronic pain: Secondary | ICD-10-CM | POA: Diagnosis present

## 2023-08-08 DIAGNOSIS — Z955 Presence of coronary angioplasty implant and graft: Secondary | ICD-10-CM

## 2023-08-08 DIAGNOSIS — Z8541 Personal history of malignant neoplasm of cervix uteri: Secondary | ICD-10-CM

## 2023-08-08 DIAGNOSIS — Z833 Family history of diabetes mellitus: Secondary | ICD-10-CM

## 2023-08-08 DIAGNOSIS — I452 Bifascicular block: Secondary | ICD-10-CM | POA: Diagnosis present

## 2023-08-08 DIAGNOSIS — R748 Abnormal levels of other serum enzymes: Secondary | ICD-10-CM | POA: Diagnosis present

## 2023-08-08 DIAGNOSIS — R112 Nausea with vomiting, unspecified: Secondary | ICD-10-CM

## 2023-08-08 DIAGNOSIS — M810 Age-related osteoporosis without current pathological fracture: Secondary | ICD-10-CM | POA: Diagnosis present

## 2023-08-08 DIAGNOSIS — K449 Diaphragmatic hernia without obstruction or gangrene: Secondary | ICD-10-CM | POA: Diagnosis present

## 2023-08-08 DIAGNOSIS — K920 Hematemesis: Principal | ICD-10-CM | POA: Diagnosis present

## 2023-08-08 DIAGNOSIS — R0789 Other chest pain: Secondary | ICD-10-CM | POA: Diagnosis not present

## 2023-08-08 DIAGNOSIS — E785 Hyperlipidemia, unspecified: Secondary | ICD-10-CM | POA: Diagnosis present

## 2023-08-08 DIAGNOSIS — E782 Mixed hyperlipidemia: Secondary | ICD-10-CM | POA: Diagnosis present

## 2023-08-08 DIAGNOSIS — Z8673 Personal history of transient ischemic attack (TIA), and cerebral infarction without residual deficits: Secondary | ICD-10-CM

## 2023-08-08 DIAGNOSIS — Z79899 Other long term (current) drug therapy: Secondary | ICD-10-CM

## 2023-08-08 DIAGNOSIS — Z8249 Family history of ischemic heart disease and other diseases of the circulatory system: Secondary | ICD-10-CM

## 2023-08-08 DIAGNOSIS — Z8719 Personal history of other diseases of the digestive system: Secondary | ICD-10-CM

## 2023-08-08 DIAGNOSIS — K219 Gastro-esophageal reflux disease without esophagitis: Secondary | ICD-10-CM

## 2023-08-08 DIAGNOSIS — Z96651 Presence of right artificial knee joint: Secondary | ICD-10-CM | POA: Diagnosis present

## 2023-08-08 DIAGNOSIS — R195 Other fecal abnormalities: Secondary | ICD-10-CM | POA: Diagnosis not present

## 2023-08-08 DIAGNOSIS — E119 Type 2 diabetes mellitus without complications: Secondary | ICD-10-CM

## 2023-08-08 DIAGNOSIS — I110 Hypertensive heart disease with heart failure: Secondary | ICD-10-CM | POA: Diagnosis present

## 2023-08-08 DIAGNOSIS — R001 Bradycardia, unspecified: Secondary | ICD-10-CM | POA: Diagnosis not present

## 2023-08-08 DIAGNOSIS — Z8711 Personal history of peptic ulcer disease: Secondary | ICD-10-CM

## 2023-08-08 DIAGNOSIS — Z7982 Long term (current) use of aspirin: Secondary | ICD-10-CM

## 2023-08-08 LAB — BASIC METABOLIC PANEL
Anion gap: 9 (ref 5–15)
BUN: 19 mg/dL (ref 8–23)
CO2: 26 mmol/L (ref 22–32)
Calcium: 9.3 mg/dL (ref 8.9–10.3)
Chloride: 105 mmol/L (ref 98–111)
Creatinine, Ser: 0.96 mg/dL (ref 0.44–1.00)
GFR, Estimated: 58 mL/min — ABNORMAL LOW (ref >60)
Glucose, Bld: 120 mg/dL — ABNORMAL HIGH (ref 70–99)
Potassium: 3.9 mmol/L (ref 3.5–5.1)
Sodium: 140 mmol/L (ref 135–145)

## 2023-08-08 LAB — LIPASE, BLOOD: Lipase: 70 U/L — ABNORMAL HIGH (ref 11–51)

## 2023-08-08 LAB — TROPONIN I (HIGH SENSITIVITY)
Troponin I (High Sensitivity): 9 ng/L (ref <18)
Troponin I (High Sensitivity): 7 ng/L (ref <18)

## 2023-08-08 LAB — HEPATIC FUNCTION PANEL
ALT: 106 U/L — ABNORMAL HIGH (ref 0–44)
AST: 251 U/L — ABNORMAL HIGH (ref 15–41)
Albumin: 4.1 g/dL (ref 3.5–5.0)
Alkaline Phosphatase: 148 U/L — ABNORMAL HIGH (ref 38–126)
Bilirubin, Direct: 0.2 mg/dL (ref 0.0–0.2)
Indirect Bilirubin: 0.6 mg/dL (ref 0.3–0.9)
Total Bilirubin: 0.8 mg/dL (ref 0.0–1.2)
Total Protein: 7 g/dL (ref 6.5–8.1)

## 2023-08-08 LAB — CBC WITH DIFFERENTIAL/PLATELET
Abs Immature Granulocytes: 0.01 10*3/uL (ref 0.00–0.07)
Basophils Absolute: 0 10*3/uL (ref 0.0–0.1)
Basophils Relative: 1 %
Eosinophils Absolute: 0.2 10*3/uL (ref 0.0–0.5)
Eosinophils Relative: 3 %
HCT: 41 % (ref 36.0–46.0)
Hemoglobin: 13.4 g/dL (ref 12.0–15.0)
Immature Granulocytes: 0 %
Lymphocytes Relative: 23 %
Lymphs Abs: 1.4 10*3/uL (ref 0.7–4.0)
MCH: 29.8 pg (ref 26.0–34.0)
MCHC: 32.7 g/dL (ref 30.0–36.0)
MCV: 91.1 fL (ref 80.0–100.0)
Monocytes Absolute: 0.6 10*3/uL (ref 0.1–1.0)
Monocytes Relative: 9 %
Neutro Abs: 4 10*3/uL (ref 1.7–7.7)
Neutrophils Relative %: 64 %
Platelets: 176 10*3/uL (ref 150–400)
RBC: 4.5 MIL/uL (ref 3.87–5.11)
RDW: 13.2 % (ref 11.5–15.5)
WBC: 6.2 10*3/uL (ref 4.0–10.5)
nRBC: 0 % (ref 0.0–0.2)

## 2023-08-08 MED ORDER — ALUM & MAG HYDROXIDE-SIMETH 200-200-20 MG/5ML PO SUSP
30.0000 mL | Freq: Once | ORAL | Status: AC
  Administered 2023-08-08: 30 mL via ORAL
  Filled 2023-08-08: qty 30

## 2023-08-08 MED ORDER — PANTOPRAZOLE SODIUM 40 MG IV SOLR
40.0000 mg | Freq: Once | INTRAVENOUS | Status: AC
  Administered 2023-08-08: 40 mg via INTRAVENOUS
  Filled 2023-08-08: qty 10

## 2023-08-08 NOTE — ED Provider Triage Note (Signed)
Emergency Medicine Provider Triage Evaluation Note  JAYONA MCCAIG , a 89 y.o. female  was evaluated in triage.  Pt complains of chest pain, vomiting, lightheadedness, now resolved s/p NTG with EMS   Review of Systems  Positive: Chest pain, vomiting, lightheadedness, weakness Negative: SOB  Physical Exam  LMP  (LMP Unknown)  Gen:   Awake, no distress   Resp:  Normal effort  MSK:   Moves extremities without difficulty  Other:    Medical Decision Making  Medically screening exam initiated at 6:01 PM.  Appropriate orders placed.  LAKAYLA BARRINGTON was informed that the remainder of the evaluation will be completed by another provider, this initial triage assessment does not replace that evaluation, and the importance of remaining in the ED until their evaluation is complete.     Lonell Grandchild, MD 08/08/23 [phone]

## 2023-08-08 NOTE — ED Provider Notes (Signed)
EMERGENCY DEPARTMENT AT [facility] Provider Note   CSN: [id number] Arrival date & time: 08/08/23  1742     History  Chief Complaint  Patient presents with   Chest Pain    Heather [name] is a 89 y.o. female.  The history is provided by the patient and medical records.  Chest Pain Associated symptoms: abdominal pain and vomiting    89 y.o. F with hx of CHF, HTN, HLP, GERD, Vit D deficiency, presenting to the ED for abdominal/chest pain.  Per family at bedside, this has been intermittent for several months now but worse over the past few weeks.  Pain seems to follow a pattern of occurring rapidly after eating, vomit a few times and then pain will resolve.  Happens independent of type of food she eats.  She has already had cholecystectomy.  She saw Dr. Derrell Lolling recently for this and had an outpatient CT done yesterday for evaluation of same.  Currently, she is pain-free.  She has had prior cardiac stenting but nothing in the past few years.  She is not having any shortness of breath.  Home Medications Prior to Admission medications   Medication Sig Start Date End Date Taking? Authorizing Provider  amoxicillin (AMOXIL) 500 MG capsule Take 500 mg by mouth 3 (three) times daily. 07/25/23  Yes [provider]  acetaminophen (TYLENOL) 500 MG tablet Take 1,000 mg by mouth every 6 (six) hours as needed for moderate pain.    [provider]  amLODipine (NORVASC) 10 MG tablet TAKE 1 TABLET EVERY DAY 11/19/22   Bradd Canary, MD  aspirin EC 81 MG tablet Take 1 tablet (81 mg total) by mouth daily. 05/04/15   Tonny Bollman, MD  Blood Glucose Monitoring Suppl (TRUE METRIX AIR GLUCOSE METER) w/Device KIT USE TO CHECK BLOOD SUGAR ONCE DAILY AND AS NEEDED.  DX CODE E11.9 07/27/20   Bradd Canary, MD  diclofenac Sodium (VOLTAREN) 1 % GEL Apply 1 application topically 4 (four) times daily as needed (hip pain).    [provider]  estradiol (ESTRACE) 0.1  MG/GM vaginal cream Place 0.5 g vaginally 2 (two) times a week. Place 0.5g nightly for two weeks then twice a week after 03/28/23   Marguerita Beards, MD  famotidine (PEPCID) 20 MG tablet TAKE 1 TABLET AT BEDTIME AS NEEDED FOR HEARTBURN OR INDIGESTION. 06/14/23   Clayborne Dana, NP  glucose blood (TRUE METRIX BLOOD GLUCOSE TEST) test strip TEST BLOOD SUGAR EVERY DAY  OR AS NEEDED 09/21/21   Bradd Canary, MD  Glycerin, PF, (OPTASE COMFORT DRY EYE) 1 % SOLN Place 1 drop into both eyes in the morning and at bedtime.    [provider]  isosorbide mononitrate (IMDUR) 60 MG 24 hr tablet TAKE 1 AND 1/2 TABLETS EVERY DAY 03/05/23   Tonny Bollman, MD  losartan (COZAAR) 100 MG tablet TAKE 1 TABLET EVERY DAY 01/07/23   Tonny Bollman, MD  Magnesium Oxide 250 MG TABS Take 250 mg by mouth every morning.    [provider]  metoprolol succinate (TOPROL-XL) 25 MG 24 hr tablet TAKE 1/2 TABLET EVERY DAY 03/21/23   Bradd Canary, MD  Multiple Vitamins-Minerals (ZINC PO) Take 50 mg by mouth daily. with Vitamin D 2000IU capsule daily    [provider]  Olopatadine HCl (PATADAY) 0.2 % SOLN Place 1 drop into both eyes daily.    [provider]  omeprazole (PRILOSEC) 20 MG capsule TAKE 1 CAPSULE  EVERY DAY AS NEEDED (NEED OFFICE VISIT BEFORE ANY FURTHER REFILLS) 07/11/22   Bradd Canary, MD  pramipexole (MIRAPEX) 0.25 MG tablet TAKE 1 TABLET TWICE DAILY 10/08/22   Bradd Canary, MD  simvastatin (ZOCOR) 20 MG tablet TAKE 1 TABLET AT BEDTIME 02/01/23   Tonny Bollman, MD  TRUEplus Lancets 33G MISC USE TO CHECK BLOOD SUGAR ONCE DAILY AND AS NEEDED.  DX CODE: E11.9 07/27/20   Bradd Canary, MD  Vibegron 75 MG TABS Take 1 tablet (75 mg total) by mouth daily. 03/28/23   Marguerita Beards, MD      Allergies    Baclofen, Gabapentin, and Nitrofurantoin    Review of Systems   Review of Systems  Cardiovascular:  Positive for chest pain.  Gastrointestinal:  Positive for abdominal  pain and vomiting.  All other systems reviewed and are negative.   Physical Exam Updated Vital Signs BP (!) 174/65   Pulse 66   Temp 97.9 F (36.6 C) (Oral)   Resp 13   LMP  (LMP Unknown)   SpO2 100%   Physical Exam Vitals and nursing note reviewed.  Constitutional:      Appearance: She is well-developed.  HENT:     Head: Normocephalic and atraumatic.  Eyes:     Conjunctiva/sclera: Conjunctivae normal.     Pupils: Pupils are equal, round, and reactive to light.  Cardiovascular:     Rate and Rhythm: Normal rate and regular rhythm.     Heart sounds: Normal heart sounds.  Pulmonary:     Effort: Pulmonary effort is normal.     Breath sounds: Normal breath sounds.  Abdominal:     General: Bowel sounds are normal.     Palpations: Abdomen is soft.     Tenderness: There is no abdominal tenderness. There is no guarding or rebound.     Comments: Soft, non-tender  Musculoskeletal:        General: Normal range of motion.     Cervical back: Normal range of motion.  Skin:    General: Skin is warm and dry.  Neurological:     Mental Status: She is alert and oriented to person, place, and time.     ED Results / Procedures / Treatments   Labs (all labs ordered are listed, but only abnormal results are displayed) Labs Reviewed  BASIC METABOLIC PANEL - Abnormal; Notable for the following components:      Result Value   Glucose, Bld 120 (*)    GFR, Estimated 58 (*)    All other components within normal limits  HEPATIC FUNCTION PANEL - Abnormal; Notable for the following components:   AST 251 (*)    ALT 106 (*)    Alkaline Phosphatase 148 (*)    All other components within normal limits  LIPASE, BLOOD - Abnormal; Notable for the following components:   Lipase 70 (*)    All other components within normal limits  CBC WITH DIFFERENTIAL/PLATELET  TROPONIN I (HIGH SENSITIVITY)  TROPONIN I (HIGH SENSITIVITY)    EKG EKG Interpretation Date/Time:  Thursday August 08 2023  18:12:59 EST Ventricular Rate:  72 PR Interval:  178 QRS Duration:  128 QT Interval:  406 QTC Calculation: 444 R Axis:   -61  Text Interpretation: Sinus rhythm with marked sinus arrhythmia Right bundle branch block Left anterior fascicular block Bifascicular block Left ventricular hypertrophy with repolarization abnormality ( R in aVL ) Cannot rule out Septal infarct , age undetermined Abnormal ECG When compared with ECG  of 08-Mar-2023 17:06, No significant change since last tracing Confirmed by Richardean Canal [phone]) on 08/08/2023 10:50:18 PM  Radiology CT Angio Abd/Pel W and/or Wo Contrast Result Date: 08/09/2023 CLINICAL DATA:  Abdominal pain EXAM: CTA ABDOMEN AND PELVIS WITHOUT AND WITH CONTRAST TECHNIQUE: Multidetector CT imaging of the abdomen and pelvis was performed using the standard protocol during bolus administration of intravenous contrast. Multiplanar reconstructed images and MIPs were obtained and reviewed to evaluate the vascular anatomy. RADIATION DOSE REDUCTION: This exam was performed according to the departmental dose-optimization program which includes automated exposure control, adjustment of the mA and/or kV according to patient size and/or use of iterative reconstruction technique. CONTRAST:  75mL OMNIPAQUE IOHEXOL 350 MG/ML SOLN COMPARISON:  08/07/2023 FINDINGS: VASCULAR Aorta: Atherosclerotic calcifications are noted. No aneurysmal dilatation or dissection is seen. Celiac: Plaque is noted at the origin with mild stenosis. No significant poststenotic dilatation is seen. SMA: Patent without evidence of aneurysm, dissection, vasculitis or significant stenosis. Renals: Both renal arteries are patent without evidence of aneurysm, dissection, vasculitis, fibromuscular dysplasia or significant stenosis. IMA: Patent without evidence of aneurysm, dissection, vasculitis or significant stenosis. Inflow: Iliacs demonstrate atherosclerotic calcification without focal abnormality. Veins: No  specific venous abnormality is noted. Review of the MIP images confirms the above findings. NON-VASCULAR Lower chest: No acute abnormality. Hepatobiliary: No focal liver abnormality is seen. Status post cholecystectomy. No biliary dilatation. Pancreas: Unremarkable. No pancreatic ductal dilatation or surrounding inflammatory changes. Spleen: Normal in size without focal abnormality. Adrenals/Urinary Tract: Adrenal glands are within normal limits. Kidneys are well visualize within normal enhancement pattern. Simple renal cysts are noted on the right. No further follow-up is recommended. The ureters are within normal limits. The bladder is decompressed. Air is seen within the bladder stable from the prior exam. Stomach/Bowel: Mild diverticular change of the colon is noted. No diverticulitis is seen. The appendix is within normal limits. Stomach shows a large hiatal hernia. Small bowel is within normal limits. Lymphatic: Aortic atherosclerosis. No enlarged abdominal or pelvic lymph nodes. Reproductive: Status post hysterectomy. No adnexal masses. Other: Postsurgical changes are noted in the anterior abdominal wall consistent with prior ventral hernia repair. No recurrent hernia is seen. The previously seen fluid collection in the operative bed is not well visualized on today's exam. Musculoskeletal: No acute bony abnormality noted. IMPRESSION: VASCULAR Scattered atherosclerotic calcifications are noted without complicating factors. No acute findings to suggest mesenteric ischemia are noted. NON-VASCULAR Diverticular change without diverticulitis. No acute abnormality is noted. Electronically Signed   By: Alcide Clever M.D.   On: 08/09/2023 02:11   CT ABDOMEN PELVIS WO CONTRAST Result Date: 08/08/2023 CLINICAL DATA:  Supraumbilical pain after hernia repair. EXAM: CT ABDOMEN AND PELVIS WITHOUT CONTRAST TECHNIQUE: Multidetector CT imaging of the abdomen and pelvis was performed following the standard protocol without IV  contrast. RADIATION DOSE REDUCTION: This exam was performed according to the departmental dose-optimization program which includes automated exposure control, adjustment of the mA and/or kV according to patient size and/or use of iterative reconstruction technique. COMPARISON:  CT 03/08/2023 FINDINGS: Lower chest: Calcified granuloma in the right lower lobe. Moderate-sized hiatal hernia. No basilar airspace disease or pleural effusion. Hepatobiliary: Unremarkable unenhanced appearance of the liver. Clips in the gallbladder fossa postcholecystectomy. No biliary dilatation. Pancreas: No ductal dilatation or inflammation. Spleen: Normal in size without focal abnormality. Adrenals/Urinary Tract: No adrenal nodule. Bilateral renal parenchymal thinning with lobulated renal contours. Right renal cysts. No further follow-up imaging is recommended. No hydronephrosis or renal calculi. Pelvic floor descent  with cystocele, tiny focus of air in the bladder. No bladder wall thickening. Stomach/Bowel: Moderate-sized hiatal hernia. The distal stomach is decompressed. There is no bowel obstruction or inflammation. Ingested pills in the cecum. The appendix is not definitively seen. Moderate volume of colonic stool. Left colonic diverticulosis. No diverticulitis. Vascular/Lymphatic: Aortic and branch atherosclerosis. No aortic aneurysm. No enlarged lymph nodes in the abdomen or pelvis. Reproductive: Status post hysterectomy. No adnexal masses. Other: Prior upper ventral abdominal wall hernia repair. Improvement in subcutaneous stranding from prior exam. Small elliptoid fluid persists in the sub muscular layer measuring 3.7 x 1.3 cm, series 3, image 28. No recurrent hernia. There is no surrounding fat stranding to suggest inflammation. No free air or free fluid. Musculoskeletal: Multilevel degenerative change in the spine, minimal scoliosis. Mild bilateral hip degenerative change. There are no acute or suspicious osseous abnormalities.  IMPRESSION: 1. Prior upper ventral abdominal wall hernia repair. Improvement in subcutaneous stranding from prior exam. Small elliptoid fluid persists in the sub muscular layer measuring 3.7 x 1.3 cm, likely postoperative seroma. No recurrent hernia. 2. Moderate-sized hiatal hernia. 3. Left colonic diverticulosis without diverticulitis. Aortic Atherosclerosis (ICD10-I70.0). Electronically Signed   By: Narda Rutherford M.D.   On: 08/08/2023 22:33   DG Chest 2 View Result Date: 08/08/2023 CLINICAL DATA:  Chest pain EXAM: CHEST - 2 VIEW COMPARISON:  02/13/2023 FINDINGS: The heart size and mediastinal contours are within normal limits. Aortic atherosclerosis. No focal airspace consolidation, pleural effusion, or pneumothorax. The visualized skeletal structures are unremarkable. IMPRESSION: No active cardiopulmonary disease. Electronically Signed   By: Duanne Guess D.O.   On: 08/08/2023 19:44    Procedures Procedures    Medications Ordered in ED Medications - No data to display  ED Course/ Medical Decision Making/ A&P                                 Medical Decision Making Amount and/or Complexity of Data Reviewed Labs: ordered. Radiology: ordered and independent interpretation performed. ECG/medicine tests: ordered and independent interpretation performed.  Risk OTC drugs. Prescription drug management. Decision regarding hospitalization.   89 year old female presenting to the ED with reported chest pain, however after talking with her this seems more epigastric in nature.  Occurs after eating over the past few weeks.  Usually after vomiting pain improves.  Has been seen by general surgery for same.  She denies any shortness of breath, diaphoresis.  EKG without acute ischemic changes.  Initial labs are reassuring.  Troponin negative x 2.  Chest x-ray without fluid overload.  Pain seems much more abdominal than chest, especially associated with vomiting after eating.  CT done yesterday  was without contrast, hiatial hernia present but no other acute findings.   LFTs today are increased (251/106, alk phos 148).  Normal bili.  These abnormalities are new from just 3 days ago..  Do feel this warrants repeat imaging.  Daughter expresses concern for mesenteric ischemia.  She has no history of this in her symptoms are not necessarily following a typical pattern of this as they have been ongoing for several weeks to months.  CTA was obtained however, no findings of mesenteric ischemia and no abnormalities with the liver.  No ductal dilatation.  Does have known large hiatal hernia which is possibly causing her vomiting after eating.  3:39 AM Patient has not had any further vomiting here.  Very long discussion with family regarding test results thus far  and likely course of action.  We discussed possibility of discharge with close outpatient GI follow-up as she is well-established with Dr. Adela Lank.  Patient initially was okay with this, however daughter and son are concerned.  It appears that her pain is worsening with frequency, she is losing weight, and is becoming quite weak.  They are uncomfortable given unknown etiology of her transaminitis today.  She is currently on a statin which may be contributing.  She denies any heavy Tylenol use.  She does not drink alcohol.  Case has been discussed with hospitalist, Dr. Loney Loh-- will admit for ongoing care.  Secure message sent to [facility] for AM follow-up.  Final Clinical Impression(s) / ED Diagnoses Final diagnoses:  Transaminitis  Elevated alkaline phosphatase level    Rx / DC Orders ED Discharge Orders     None         Garlon Hatchet, PA-C 08/09/23 0427    Charlynne Pander, MD 08/09/23 [phone]

## 2023-08-08 NOTE — ED Triage Notes (Signed)
Pt from home with reports of CP. Pt reports she ate chicken last night and vomited which caused the chest pain. Pt was given 324 ASA and .4 nitroglycerin. Pt with no chest pain at this time.

## 2023-08-09 ENCOUNTER — Inpatient Hospital Stay (HOSPITAL_COMMUNITY): Payer: Medicare HMO

## 2023-08-09 ENCOUNTER — Observation Stay (HOSPITAL_COMMUNITY): Payer: Medicare HMO

## 2023-08-09 ENCOUNTER — Emergency Department (HOSPITAL_COMMUNITY): Payer: Medicare HMO

## 2023-08-09 DIAGNOSIS — Z86718 Personal history of other venous thrombosis and embolism: Secondary | ICD-10-CM | POA: Diagnosis not present

## 2023-08-09 DIAGNOSIS — E119 Type 2 diabetes mellitus without complications: Secondary | ICD-10-CM | POA: Diagnosis present

## 2023-08-09 DIAGNOSIS — K319 Disease of stomach and duodenum, unspecified: Secondary | ICD-10-CM | POA: Diagnosis not present

## 2023-08-09 DIAGNOSIS — E1121 Type 2 diabetes mellitus with diabetic nephropathy: Secondary | ICD-10-CM | POA: Diagnosis not present

## 2023-08-09 DIAGNOSIS — K5730 Diverticulosis of large intestine without perforation or abscess without bleeding: Secondary | ICD-10-CM | POA: Diagnosis not present

## 2023-08-09 DIAGNOSIS — R7989 Other specified abnormal findings of blood chemistry: Secondary | ICD-10-CM | POA: Diagnosis not present

## 2023-08-09 DIAGNOSIS — E782 Mixed hyperlipidemia: Secondary | ICD-10-CM | POA: Diagnosis present

## 2023-08-09 DIAGNOSIS — G2581 Restless legs syndrome: Secondary | ICD-10-CM | POA: Diagnosis present

## 2023-08-09 DIAGNOSIS — R7401 Elevation of levels of liver transaminase levels: Secondary | ICD-10-CM | POA: Diagnosis present

## 2023-08-09 DIAGNOSIS — K920 Hematemesis: Secondary | ICD-10-CM | POA: Diagnosis present

## 2023-08-09 DIAGNOSIS — M810 Age-related osteoporosis without current pathological fracture: Secondary | ICD-10-CM | POA: Diagnosis present

## 2023-08-09 DIAGNOSIS — K219 Gastro-esophageal reflux disease without esophagitis: Secondary | ICD-10-CM | POA: Diagnosis present

## 2023-08-09 DIAGNOSIS — R1013 Epigastric pain: Secondary | ICD-10-CM

## 2023-08-09 DIAGNOSIS — R748 Abnormal levels of other serum enzymes: Secondary | ICD-10-CM | POA: Diagnosis present

## 2023-08-09 DIAGNOSIS — E785 Hyperlipidemia, unspecified: Secondary | ICD-10-CM

## 2023-08-09 DIAGNOSIS — Z961 Presence of intraocular lens: Secondary | ICD-10-CM | POA: Diagnosis present

## 2023-08-09 DIAGNOSIS — R109 Unspecified abdominal pain: Secondary | ICD-10-CM | POA: Diagnosis not present

## 2023-08-09 DIAGNOSIS — G8929 Other chronic pain: Secondary | ICD-10-CM | POA: Diagnosis present

## 2023-08-09 DIAGNOSIS — Z8249 Family history of ischemic heart disease and other diseases of the circulatory system: Secondary | ICD-10-CM | POA: Diagnosis not present

## 2023-08-09 DIAGNOSIS — N281 Cyst of kidney, acquired: Secondary | ICD-10-CM | POA: Diagnosis not present

## 2023-08-09 DIAGNOSIS — I110 Hypertensive heart disease with heart failure: Secondary | ICD-10-CM | POA: Diagnosis present

## 2023-08-09 DIAGNOSIS — I2510 Atherosclerotic heart disease of native coronary artery without angina pectoris: Secondary | ICD-10-CM | POA: Diagnosis present

## 2023-08-09 DIAGNOSIS — K449 Diaphragmatic hernia without obstruction or gangrene: Secondary | ICD-10-CM

## 2023-08-09 DIAGNOSIS — R300 Dysuria: Secondary | ICD-10-CM | POA: Diagnosis not present

## 2023-08-09 DIAGNOSIS — K2970 Gastritis, unspecified, without bleeding: Secondary | ICD-10-CM | POA: Diagnosis not present

## 2023-08-09 DIAGNOSIS — Z8541 Personal history of malignant neoplasm of cervix uteri: Secondary | ICD-10-CM | POA: Diagnosis not present

## 2023-08-09 DIAGNOSIS — Z955 Presence of coronary angioplasty implant and graft: Secondary | ICD-10-CM | POA: Diagnosis not present

## 2023-08-09 DIAGNOSIS — K921 Melena: Secondary | ICD-10-CM | POA: Diagnosis not present

## 2023-08-09 DIAGNOSIS — Z833 Family history of diabetes mellitus: Secondary | ICD-10-CM | POA: Diagnosis not present

## 2023-08-09 DIAGNOSIS — Z96651 Presence of right artificial knee joint: Secondary | ICD-10-CM | POA: Diagnosis present

## 2023-08-09 DIAGNOSIS — I10 Essential (primary) hypertension: Secondary | ICD-10-CM

## 2023-08-09 DIAGNOSIS — I452 Bifascicular block: Secondary | ICD-10-CM | POA: Diagnosis present

## 2023-08-09 DIAGNOSIS — I5032 Chronic diastolic (congestive) heart failure: Secondary | ICD-10-CM | POA: Diagnosis present

## 2023-08-09 DIAGNOSIS — Z85828 Personal history of other malignant neoplasm of skin: Secondary | ICD-10-CM | POA: Diagnosis not present

## 2023-08-09 DIAGNOSIS — Z7982 Long term (current) use of aspirin: Secondary | ICD-10-CM | POA: Diagnosis not present

## 2023-08-09 LAB — COMPREHENSIVE METABOLIC PANEL
ALT: 127 U/L — ABNORMAL HIGH (ref 0–44)
AST: 102 U/L — ABNORMAL HIGH (ref 15–41)
Albumin: 3.7 g/dL (ref 3.5–5.0)
Alkaline Phosphatase: 150 U/L — ABNORMAL HIGH (ref 38–126)
Anion gap: 11 (ref 5–15)
BUN: 11 mg/dL (ref 8–23)
CO2: 25 mmol/L (ref 22–32)
Calcium: 9.2 mg/dL (ref 8.9–10.3)
Chloride: 105 mmol/L (ref 98–111)
Creatinine, Ser: 0.8 mg/dL (ref 0.44–1.00)
GFR, Estimated: >60 mL/min (ref >60)
Glucose, Bld: 86 mg/dL (ref 70–99)
Potassium: 3.8 mmol/L (ref 3.5–5.1)
Sodium: 141 mmol/L (ref 135–145)
Total Bilirubin: 0.5 mg/dL (ref 0.0–1.2)
Total Protein: 6.1 g/dL — ABNORMAL LOW (ref 6.5–8.1)

## 2023-08-09 LAB — CBC
HCT: 40.6 % (ref 36.0–46.0)
Hemoglobin: 13.4 g/dL (ref 12.0–15.0)
MCH: 29.6 pg (ref 26.0–34.0)
MCHC: 33 g/dL (ref 30.0–36.0)
MCV: 89.8 fL (ref 80.0–100.0)
Platelets: 147 10*3/uL — ABNORMAL LOW (ref 150–400)
RBC: 4.52 MIL/uL (ref 3.87–5.11)
RDW: 13.2 % (ref 11.5–15.5)
WBC: 4.6 10*3/uL (ref 4.0–10.5)
nRBC: 0 % (ref 0.0–0.2)

## 2023-08-09 LAB — HEPATITIS PANEL, ACUTE
HCV Ab: NONREACTIVE
Hep A IgM: NONREACTIVE
Hep B C IgM: NONREACTIVE
Hepatitis B Surface Ag: NONREACTIVE

## 2023-08-09 LAB — URINALYSIS, ROUTINE W REFLEX MICROSCOPIC
Bilirubin Urine: NEGATIVE
Glucose, UA: NEGATIVE mg/dL
Ketones, ur: NEGATIVE mg/dL
Nitrite: NEGATIVE
Protein, ur: NEGATIVE mg/dL
Specific Gravity, Urine: 1.008 (ref 1.005–1.030)
WBC, UA: >50 WBC/hpf (ref 0–5)
pH: 7 (ref 5.0–8.0)

## 2023-08-09 LAB — HEPATIC FUNCTION PANEL
ALT: 130 U/L — ABNORMAL HIGH (ref 0–44)
AST: 102 U/L — ABNORMAL HIGH (ref 15–41)
Albumin: 3.8 g/dL (ref 3.5–5.0)
Alkaline Phosphatase: 151 U/L — ABNORMAL HIGH (ref 38–126)
Bilirubin, Direct: 0.1 mg/dL (ref 0.0–0.2)
Indirect Bilirubin: 0.7 mg/dL (ref 0.3–0.9)
Total Bilirubin: 0.8 mg/dL (ref 0.0–1.2)
Total Protein: 6.2 g/dL — ABNORMAL LOW (ref 6.5–8.1)

## 2023-08-09 MED ORDER — SODIUM CHLORIDE 0.9% FLUSH
3.0000 mL | Freq: Two times a day (BID) | INTRAVENOUS | Status: DC
  Administered 2023-08-09 – 2023-08-11 (×6): 3 mL via INTRAVENOUS

## 2023-08-09 MED ORDER — ONDANSETRON HCL 4 MG/2ML IJ SOLN: 4.0000 mg | Freq: Four times a day (QID) | INTRAMUSCULAR | Status: DC | PRN

## 2023-08-09 MED ORDER — ACETAMINOPHEN 650 MG RE SUPP: 650.0000 mg | Freq: Four times a day (QID) | RECTAL | Status: DC | PRN

## 2023-08-09 MED ORDER — ACETAMINOPHEN 325 MG PO TABS
650.0000 mg | ORAL_TABLET | Freq: Four times a day (QID) | ORAL | Status: DC | PRN
  Administered 2023-08-09: 650 mg via ORAL
  Filled 2023-08-09: qty 2

## 2023-08-09 MED ORDER — PANTOPRAZOLE SODIUM 40 MG IV SOLR
40.0000 mg | Freq: Two times a day (BID) | INTRAVENOUS | Status: DC
  Administered 2023-08-09 – 2023-08-12 (×7): 40 mg via INTRAVENOUS
  Filled 2023-08-09 (×7): qty 10

## 2023-08-09 MED ORDER — ONDANSETRON HCL 4 MG PO TABS: 4.0000 mg | ORAL_TABLET | Freq: Four times a day (QID) | ORAL | Status: DC | PRN

## 2023-08-09 MED ORDER — SODIUM CHLORIDE 0.9 % IV SOLN: INTRAVENOUS | Status: DC

## 2023-08-09 MED ORDER — ALBUTEROL SULFATE (2.5 MG/3ML) 0.083% IN NEBU
2.5000 mg | INHALATION_SOLUTION | Freq: Four times a day (QID) | RESPIRATORY_TRACT | Status: DC | PRN
Start: 2023-08-09 — End: 2023-08-13

## 2023-08-09 MED ORDER — ENOXAPARIN SODIUM 40 MG/0.4ML IJ SOSY
40.0000 mg | PREFILLED_SYRINGE | Freq: Every day | INTRAMUSCULAR | Status: DC
  Administered 2023-08-09 – 2023-08-12 (×4): 40 mg via SUBCUTANEOUS
  Filled 2023-08-09 (×4): qty 0.4

## 2023-08-09 MED ORDER — IOHEXOL 350 MG/ML SOLN
75.0000 mL | Freq: Once | INTRAVENOUS | Status: AC | PRN
  Administered 2023-08-09: 75 mL via INTRAVENOUS

## 2023-08-09 MED ORDER — PRAMIPEXOLE DIHYDROCHLORIDE 0.25 MG PO TABS
0.2500 mg | ORAL_TABLET | Freq: Once | ORAL | Status: AC
  Administered 2023-08-09: 0.25 mg via ORAL
  Filled 2023-08-09: qty 1

## 2023-08-09 MED ORDER — GADOBUTROL 1 MMOL/ML IV SOLN
7.5000 mL | Freq: Once | INTRAVENOUS | Status: AC | PRN
  Administered 2023-08-09: 7.5 mL via INTRAVENOUS

## 2023-08-09 NOTE — H&P (Incomplete)
History and Physical    Patient: Heather [name] ZOX:[id number] DOB: September 22, 1934 DOA: 08/08/2023 DOS: the patient was seen and examined on 08/09/2023 PCP: Bradd Canary, MD  Patient coming from: Home via EMS  Chief Complaint:  Chief Complaint  Patient presents with  . Abdominal pain   HPI: Heather [name] is a 89 y.o. female with medical history significant of hypertension, hyperlipidemia, chronic HFpEF, CAD, diabetes mellitus type 2 presented with complaints of intermittent abdominal pain. The pain had been present for months it seems, but had recently increased in frequency in the last week. It is primarily located in the upper abdominal and lower thoracic region, described as around the diaphragm area. She also experiences nausea and vomiting associated with the abdominal pain.  Emesis had initially been ingested food contents, but yesterday patient reported emesis having a coffee-ground appearance.  She was able to have a bowel movement today but does not comment on color. She had her gallbladder removed two years ago in August 2022.  She just recently had ventral hernia repair with Dr. Derrell Lolling in August 204, this was initially thought to possibly resolve patient's abdominal.  She also reports abdominal distention or bloating accompanying her current symptoms.   En route with EMS patient was given full dose aspirin and 0.4 mg of nitroglycerin.  Upon admission into the emergency department patient was noted to be afebrile, heart rate 55-68, respirations 12-23, blood pressure 136/72-174/65, and O2 saturations maintained on room air.  Labs significant for alkaline phosphatase 148, lipase 70, AST 251, ALT 106, and troponin 9->7.  Chest x-ray noted no acute abnormality.  CT abdomen and pelvis noted scattered atherosclerotic calcifications without complicating features, large hiatal hernia, and diverticulosis without signs of diverticulitis.  Patient was given Protonix 40 mg IV and GI  cocktail.   Review of Systems: As mentioned in the history of present illness. All other systems reviewed and are negative. Past Medical History:  Diagnosis Date  . Aortic atherosclerosis (HCC) 10/12/2021  . Cancer (HCC)    Uterine  . Cataract   . Chronic heart failure with preserved ejection fraction (HFpEF) (HCC)   . Chronic stable angina (HCC)   . Coronary artery disease cardiologist--- dr Excell Seltzer  . Diabetes mellitus type 2, diet-controlled (HCC)    followed by pcp  (10-19-2020  pt stated checks daily in am,  fasting blood surgar--- 115--120s)  . DOE (dyspnea on exertion)    per pt "when I over do",  ok with household chores  . Echocardiogram 08/2020    Echocardiogram 2/22: EF 55-60, no RWMA, mild LVH, Gr 2 DD, GLS-21.7%, normal RVSF, trivial MR, RVSP 39.5  . Edema of right lower extremity   . GERD (gastroesophageal reflux disease)   . Hiatal hernia    recurrence,  hx HH repair 1989  . History of cervical cancer    s/p  vaginal hysterectomy  . History of DVT of lower extremity 2016   11-29-2014 post op right TKA of right lower extremity and completed xarelto   . History of esophageal stricture    hx s/p dilatation's  . History of gastric ulcer 2005 approx.  Marland Kitchen History of palpitations 2010   event monitor 07-07-2009 showed NSR w/ freq. SVT ectopies with short runs, rare PVCs  . History of TIA (transient ischemic attack) 06/1999   12-15-2019  per pt had several TIA between 12/ 2000 to 02/ 2001 , was sent to specialist @[facility] , had test that was normal (10-19-2020 pt stated no  TIAs since ) but has residual of essential tremors of right arm/ hand  . Hypertension   . Intermittent palpitations   . IT band syndrome   . Migraine    "ice pick headche lasts about 30 seconds"  . Mixed hyperlipidemia   . Mixed incontinence urge and stress   . Multiple thyroid nodules    followed by pcp---   ultrasound 11-22-2014 no bx   (12-15-2019 per pt had a endocrinologist and was told did not need  bx)  . OA (osteoarthritis)    knees, elbow, hip, ankles  . Occasional tremors    right arm/ hand  s/p TIA residual 2000  . Osteoporosis    taking vitamin d  . Peroneal DVT (deep venous thrombosis) (HCC) 12/22/2014  . Right bundle branch block (RBBB) with left anterior fascicular block (LAFB)   . RLS (restless legs syndrome)   . S/P drug eluting coronary stent placement 2006   03-16-2005  PCI x1 DES to LCx;   06-27-2005  PCI x1 DES to RCA  . Stroke (HCC)    TIA's  . Urinary retention    post op sling prodecure on 10-27-2020, has foley cathether   Past Surgical History:  Procedure Laterality Date  . ANTERIOR AND POSTERIOR REPAIR N/A 12/22/2019   Procedure: ANTERIOR (CYSTOCELE)  REPAIR;  Surgeon: Sherian Rein, MD;  Location: [facility];  Service: Gynecology;  Laterality: N/A;  . ANTERIOR AND POSTERIOR REPAIR WITH SACROSPINOUS FIXATION N/A 10/27/2020   Procedure: SACROSPINOUS LIGAMENT FIXATION;  Surgeon: Marguerita Beards, MD;  Location: [facility];  Service: Gynecology;  Laterality: N/A;  . BLADDER SUSPENSION N/A 10/27/2020   Procedure: TRANSVAGINAL TAPE (TVT) PROCEDURE;  Surgeon: Marguerita Beards, MD;  Location: [facility];  Service: Gynecology;  Laterality: N/A;  . CATARACT EXTRACTION W/ INTRAOCULAR LENS  IMPLANT, BILATERAL  2015  . CHOLECYSTECTOMY N/A 01/25/2021   Procedure: LAPAROSCOPIC CHOLECYSTECTOMY WITH INTRAOPERATIVE CHOLANGIOGRAM AND LYSIS OF ADHESIONS;  Surgeon: Karie Soda, MD;  Location: [facility];  Service: General;  Laterality: N/A;  . COLONOSCOPY  last one ?  . CORONARY ANGIOPLASTY WITH STENT PLACEMENT  03-16-2005   dr Riley Kill   PCI and DES x1 to LCx  . CORONARY ANGIOPLASTY WITH STENT PLACEMENT  06-27-2005  dr Riley Kill   PCI and DES x1 to RCA with residual disease LAD 70-80% to manage medically  . CYSTOSCOPY N/A 10/27/2020   Procedure: CYSTOSCOPY;  Surgeon: Marguerita Beards, MD;  Location: [facility];  Service: Gynecology;  Laterality: N/A;  . CYSTOSCOPY N/A 11/09/2020   Procedure: CYSTOSCOPY;  Surgeon: Marguerita Beards, MD;  Location: [facility];  Service: Gynecology;  Laterality: N/A;  . EYE SURGERY    . FOOT SURGERY Left 1990s   left foot stress fracture repair, per pt no hardware  . FRACTURE SURGERY    . HERNIA REPAIR    . HIATAL HERNIA REPAIR  1989  . INCISIONAL HERNIA REPAIR N/A 01/25/2021   Procedure: PRIMARY REPAIR OF INCISIONAL HERNIA;  Surgeon: Karie Soda, MD;  Location: [facility];  Service: General;  Laterality: N/A;  . INGUINAL HERNIA REPAIR Left 04/17/2021   Procedure: LAPAROSCOPIC LEFT INGUINAL HERNIA REPAIR WITH MESH;  Surgeon: Axel Filler, MD;  Location: [facility];  Service: General;  Laterality: Left;  . JOINT REPLACEMENT    . KNEE ARTHROSCOPY Bilateral right ?/   left x2 , last one 09-12-2009 @ [facility]  . LYSIS OF ADHESION N/A 02/11/2023  Procedure: LYSIS OF ADHESION;  Surgeon: Axel Filler, MD;  Location[facility];  Service: General;  Laterality: N/A;  . PUBOVAGINAL SLING N/A 11/09/2020   Procedure: REVISION OF PUBO-VAGINAL SLING;  Surgeon: Marguerita Beards, MD;  Location: [facility];  Service: Gynecology;  Laterality: N/A;  . RECTOCELE REPAIR N/A 04/20/2020   Procedure: POSTERIOR REPAIR (RECTOCELE);  Surgeon: Sherian Rein, MD;  Location: [facility];  Service: Gynecology;  Laterality: N/A;  . RECTOCELE REPAIR N/A 10/27/2020   Procedure: POSTERIOR REPAIR (RECTOCELE);  Surgeon: Marguerita Beards, MD;  Location: [facility];  Service: Gynecology;  Laterality: N/A;  total time requested for all procedures is 2 hours  . SMALL INTESTINE SURGERY    . TOTAL KNEE ARTHROPLASTY  11/12/2011   Procedure: TOTAL KNEE ARTHROPLASTY;  Surgeon: Nilda Simmer, MD;  Location: [facility];  Service: Orthopedics;  Laterality: Left;  Dr Thurston Hole wants 90 minutes for this case  . TOTAL KNEE  ARTHROPLASTY Right 11/29/2014   Procedure: RIGHT TOTAL KNEE ARTHROPLASTY;  Surgeon: Dannielle Huh, MD;  Location: [facility];  Service: Orthopedics;  Laterality: Right;  . UPPER GASTROINTESTINAL ENDOSCOPY  last one 04-25-2017   with dilatation esophageal stricture and savary dilatation  . VAGINAL HYSTERECTOMY  1988    no ovaries removed for bleeeding  . VENTRAL HERNIA REPAIR N/A 02/11/2023   Procedure: LAPAROSCOPIC ATTEMPTED OPEN VENTRAL HERNIA REPAIR WITH MESH;  Surgeon: Axel Filler, MD;  Location: [facility];  Service: General;  Laterality: N/A;  2.5 HRS   Social History:  reports that she has never smoked. She has never used smokeless tobacco. She reports that she does not drink alcohol and does not use drugs.  Allergies  Allergen Reactions  . Baclofen Other (See Comments)    Hyperactivity   . Gabapentin Other (See Comments)    Made her hyper  . Nitrofurantoin Rash    Family History  Problem Relation Age of Onset  . Stroke Father        family hx of M 1st degree relative <50  . Heart disease Father   . Coronary artery disease Mother   . Heart disease Mother   . Arthritis Mother   . Hearing loss Mother   . Vision loss Mother   . Depression Brother   . Cancer Brother   . Stroke Brother   . Diabetes Brother   . Cancer Brother        bladder with mets  . Diabetes Daughter        borderline  . Hypertension Daughter   . Arthritis Other        family hx of  . Hypertension Other        family hx of  . Other Other        family hx of cardiovascular disorder  . Thyroid disease Daughter   . Early death Daughter   . Breast cancer Neg Hx   . Colon cancer Neg Hx   . Anesthesia problems Neg Hx   . Hypotension Neg Hx   . Malignant hyperthermia Neg Hx   . Pseudochol deficiency Neg Hx   . Colon polyps Neg Hx   . Esophageal cancer Neg Hx   . Rectal cancer Neg Hx   . Stomach cancer Neg Hx     Prior to Admission medications   Medication Sig Start Date End Date Taking? Authorizing  Provider  acetaminophen (TYLENOL) 500 MG tablet Take 1,000 mg by mouth every 6 (six) hours  as needed for moderate pain.   Yes [provider]  amLODipine (NORVASC) 10 MG tablet TAKE 1 TABLET EVERY DAY 11/19/22  Yes Bradd Canary, MD  amoxicillin (AMOXIL) 500 MG capsule Take 500 mg by mouth 3 (three) times daily. Use before going to the dentist 07/25/23  Yes [provider]  aspirin EC 81 MG tablet Take 1 tablet (81 mg total) by mouth daily. 05/04/15  Yes Tonny Bollman, MD  diclofenac Sodium (VOLTAREN) 1 % GEL Apply 1 application topically 4 (four) times daily as needed (hip pain).   Yes [provider]  estradiol (ESTRACE) 0.1 MG/GM vaginal cream Place 0.5 g vaginally 2 (two) times a week. Place 0.5g nightly for two weeks then twice a week after 03/28/23  Yes Marguerita Beards, MD  furosemide (LASIX) 20 MG tablet Take 20 mg by mouth as needed for fluid.   Yes [provider]  Glycerin, PF, (OPTASE COMFORT DRY EYE) 1 % SOLN Place 1 drop into both eyes in the morning and at bedtime.   Yes [provider]  isosorbide mononitrate (IMDUR) 60 MG 24 hr tablet TAKE 1 AND 1/2 TABLETS EVERY DAY 03/05/23  Yes Tonny Bollman, MD  losartan (COZAAR) 100 MG tablet TAKE 1 TABLET EVERY DAY Patient taking differently: Take 50 mg by mouth 2 (two) times daily. 01/07/23  Yes Tonny Bollman, MD  Magnesium Oxide 250 MG TABS Take 250 mg by mouth every morning.   Yes [provider]  metoprolol succinate (TOPROL-XL) 25 MG 24 hr tablet TAKE 1/2 TABLET EVERY DAY 03/21/23  Yes Bradd Canary, MD  Multiple Vitamins-Minerals (ZINC PO) Take 50 mg by mouth daily. with Vitamin D 2000IU capsule daily   Yes [provider]  Olopatadine HCl (PATADAY) 0.2 % SOLN Place 1 drop into both eyes daily.   Yes [provider]  omeprazole (PRILOSEC) 20 MG capsule TAKE 1 CAPSULE EVERY DAY AS NEEDED (NEED OFFICE VISIT BEFORE ANY FURTHER REFILLS) Patient taking  differently: Take 20 mg by mouth every morning. 07/11/22  Yes Bradd Canary, MD  pramipexole (MIRAPEX) 0.25 MG tablet TAKE 1 TABLET TWICE DAILY 10/08/22  Yes Bradd Canary, MD  Vibegron 75 MG TABS Take 1 tablet (75 mg total) by mouth daily. Patient taking differently: Take 1 tablet by mouth daily as needed (Urinary Issues). 03/28/23  Yes Marguerita Beards, MD  Blood Glucose Monitoring Suppl (TRUE METRIX AIR GLUCOSE METER) w/Device KIT USE TO CHECK BLOOD SUGAR ONCE DAILY AND AS NEEDED.  DX CODE E11.9 07/27/20   Bradd Canary, MD  famotidine (PEPCID) 20 MG tablet TAKE 1 TABLET AT BEDTIME AS NEEDED FOR HEARTBURN OR INDIGESTION. Patient not taking: Reported on 08/09/2023 06/14/23   Hyman Hopes B, NP  glucose blood (TRUE METRIX BLOOD GLUCOSE TEST) test strip TEST BLOOD SUGAR EVERY DAY  OR AS NEEDED 09/21/21   Bradd Canary, MD  simvastatin (ZOCOR) 20 MG tablet TAKE 1 TABLET AT BEDTIME 02/01/23   Tonny Bollman, MD  TRUEplus Lancets 33G MISC USE TO CHECK BLOOD SUGAR ONCE DAILY AND AS NEEDED.  DX CODE: E11.9 07/27/20   Bradd Canary, MD    Physical Exam: Vitals:   08/09/23 0530 08/09/23 0545 08/09/23 0645 08/09/23 0730  BP: (!) 147/72 (!) 147/56 (!) 152/78 (!) 143/72  Pulse: 65 (!) 58 65 73  Resp: 12 14 15 17   Temp:      TempSrc:      SpO2: 100% 99% 99% 99%  Constitutional: Elderly female who appears ill but in no acute distress able to follow commands. Eyes: PERRL, lids and conjunctivae normal ENMT: Mucous membranes are moist. Normal dentition.  Neck: normal, supple, no masses, no thyromegaly Respiratory: clear to auscultation bilaterally, no wheezing, no crackles. No accessory muscle use.  Cardiovascular: Regular rate and rhythm, no murmurs / rubs / gallops. No extremity edema.  Abdomen: no tenderness, no masses palpated. No hepatosplenomegaly. Bowel sounds positive.  Musculoskeletal: no clubbing / cyanosis. No joint deformity upper and lower extremities. Good ROM, no contractures.  Normal muscle tone.  Skin: no rashes, lesions, ulcers. No induration Neurologic: CN 2-12 grossly intact. Sensation intact, DTR normal. Strength 5/5 in all 4.  Psychiatric: Normal judgment and insight. Alert and oriented x 3. Normal mood.   Data Reviewed: {Tip this will not be part of the note when signed- Document your independent interpretation of telemetry tracing, EKG, lab, Radiology test or any other diagnostic tests. Add any new diagnostic test ordered today. (Optional):[id number]} EKG revealed sinus rhythm with marked sinus arrhythmia, RBBB, and LAFB.  Otherwise similar to prior tracing from 03/08/2023.  Reviewed labs, imaging, and pertinent records as documented.  Assessment and Plan:  Epigastric abdominal pain Transaminitis Acute.  Patient presented with complaints of chest pain.  Labs significant for high-sensitivity troponins were negative x 2, alkaline phosphatase 148, lipase 70, AST 251, ALT 106.  EKG did not note any significant ischemic changes.  Chest x-ray noted no acute abnormality.  CT of the abdomen pelvis did not note any significant findings to suggest mesenteric ischemia or acute intra-abdominal abnormality.  Gastroenterology hadbeen formally consulted. -Admit to a telemetry bed -Check hepatitis panel -Check abdominal ultrasound -Continue to monitor LFTs -Normal saline IV fluids 75 mL/h -Appreciate GI consultative services, will follow-up for any further recommendations   Hiatal hernia GERD   Controlled diabetes mellitus type 2, without long-term use of insulin Last hemoglobin A1c 6.8 when checked on 08/05/2023   DVT prophylaxis: Advance Care Planning:   Code Status: Full Code ***  Consults: Gastroenterology  Family Communication: ***  Severity of Illness: The appropriate patient status for this patient is INPATIENT. Inpatient status is judged to be reasonable and necessary in order to provide the required intensity of service to ensure the patient's safety. The  patient's presenting symptoms, physical exam findings, and initial radiographic and laboratory data in the context of their chronic comorbidities is felt to place them at high risk for further clinical deterioration. Furthermore, it is not anticipated that the patient will be medically stable for discharge from the hospital within 2 midnights of admission.   * I certify that at the point of admission it is my clinical judgment that the patient will require inpatient hospital care spanning beyond 2 midnights from the point of admission due to high intensity of service, high risk for further deterioration and high frequency of surveillance required.*  Author: Clydie Braun, MD 08/09/2023 8:34 AM  For on call review [email or web address].

## 2023-08-09 NOTE — ED Notes (Signed)
 Patient transported to Ultrasound

## 2023-08-09 NOTE — Consult Note (Addendum)
Referring Provider: EDP Primary Care Physician:  Bradd Canary, MD Primary Gastroenterologist:  Dr. Adela Lank  Reason for Consultation: Nausea, vomiting, elevated LFTs  HPI: Heather [name] is a 89 y.o. female with medical history significant of hypertension, hyperlipidemia, chronic HFpEF, CAD, diabetes mellitus type 2 who presented to [facility] with complaints of what she was concerned was chest pain but also recurrent episodes of upper abdominal pain.  Her daughter is present at bedside.  They tell me that she has had episodes of epigastric abdominal pain with nausea and vomiting that have been occurring for months.  She had a ventral hernia repair with Dr. Derrell Lolling back in August they thought that maybe was the cause of her symptoms and that repair of that would prevent recurrence of these.  Unfortunately that was not the case.  She has continued to have episodes.  They typically occur once a week and lasts 30 minutes coming and going without rhyme or reason.  Pain doubles her over.  This week she had multiple episodes and yesterday pain seemed to be in her chest so she presented for evaluation.  She tells me "I cannot take much more of this pain".  They tell me that yesterday when she vomited it appeared to be coffee-ground color, which has been different from previous.  LFTs are elevated with an AST of 251, ALT 106, alk phos 148, total bili 0.8.  These were normal 4 days ago.  5 months ago she had just an elevated AST at 87.   CT angio abdomen and pelvis: IMPRESSION: VASCULAR   Scattered atherosclerotic calcifications are noted without complicating factors.   No acute findings to suggest mesenteric ischemia are noted.   NON-VASCULAR   Diverticular change without diverticulitis.   No acute abnormality is noted.  Ultrasound: IMPRESSION: Previous cholecystectomy. Minimal central intrahepatic biliary duct Dilatation  Hgb is normal.  BUN is normal.   Past Medical History:  Diagnosis  Date   Aortic atherosclerosis (HCC) 10/12/2021   Cancer (HCC)    Uterine   Cataract    Chronic heart failure with preserved ejection fraction (HFpEF) (HCC)    Chronic stable angina (HCC)    Coronary artery disease cardiologist--- dr Excell Seltzer   Diabetes mellitus type 2, diet-controlled (HCC)    followed by pcp  (10-19-2020  pt stated checks daily in am,  fasting blood surgar--- 115--120s)   DOE (dyspnea on exertion)    per pt "when I over do",  ok with household chores   Echocardiogram 08/2020    Echocardiogram 2/22: EF 55-60, no RWMA, mild LVH, Gr 2 DD, GLS-21.7%, normal RVSF, trivial MR, RVSP 39.5   Edema of right lower extremity    GERD (gastroesophageal reflux disease)    Hiatal hernia    recurrence,  hx HH repair 1989   History of cervical cancer    s/p  vaginal hysterectomy   History of DVT of lower extremity 2016   11-29-2014 post op right TKA of right lower extremity and completed xarelto    History of esophageal stricture    hx s/p dilatation's   History of gastric ulcer 2005 approx.   History of palpitations 2010   event monitor 07-07-2009 showed NSR w/ freq. SVT ectopies with short runs, rare PVCs   History of TIA (transient ischemic attack) 06/1999   12-15-2019  per pt had several TIA between 12/ 2000 to 02/ 2001 , was sent to specialist @[facility] , had test that was normal (10-19-2020 pt stated no TIAs  since ) but has residual of essential tremors of right arm/ hand   Hypertension    Intermittent palpitations    IT band syndrome    Migraine    "ice pick headche lasts about 30 seconds"   Mixed hyperlipidemia    Mixed incontinence urge and stress    Multiple thyroid nodules    followed by pcp---   ultrasound 11-22-2014 no bx   (12-15-2019 per pt had a endocrinologist and was told did not need bx)   OA (osteoarthritis)    knees, elbow, hip, ankles   Occasional tremors    right arm/ hand  s/p TIA residual 2000   Osteoporosis    taking vitamin d   Peroneal DVT (deep venous  thrombosis) (HCC) 12/22/2014   Right bundle branch block (RBBB) with left anterior fascicular block (LAFB)    RLS (restless legs syndrome)    S/P drug eluting coronary stent placement 2006   03-16-2005  PCI x1 DES to LCx;   06-27-2005  PCI x1 DES to RCA   Stroke [facility])    TIA's   Urinary retention    post op sling prodecure on 10-27-2020, has foley cathether    Past Surgical History:  Procedure Laterality Date   ANTERIOR AND POSTERIOR REPAIR N/A 12/22/2019   Procedure: ANTERIOR (CYSTOCELE)  REPAIR;  Surgeon: Sherian Rein, MD;  Location: [facility];  Service: Gynecology;  Laterality: N/A;   ANTERIOR AND POSTERIOR REPAIR WITH SACROSPINOUS FIXATION N/A 10/27/2020   Procedure: SACROSPINOUS LIGAMENT FIXATION;  Surgeon: Marguerita Beards, MD;  Location[facility];  Service: Gynecology;  Laterality: N/A;   BLADDER SUSPENSION N/A 10/27/2020   Procedure: TRANSVAGINAL TAPE (TVT) PROCEDURE;  Surgeon: Marguerita Beards, MD;  Location: [facility];  Service: Gynecology;  Laterality: N/A;   CATARACT EXTRACTION W/ INTRAOCULAR LENS  IMPLANT, BILATERAL  2015   CHOLECYSTECTOMY N/A 01/25/2021   Procedure: LAPAROSCOPIC CHOLECYSTECTOMY WITH INTRAOPERATIVE CHOLANGIOGRAM AND LYSIS OF ADHESIONS;  Surgeon: Karie Soda, MD;  Location: [facility];  Service: General;  Laterality: N/A;   COLONOSCOPY  last one ?   CORONARY ANGIOPLASTY WITH STENT PLACEMENT  03-16-2005   dr Riley Kill   PCI and DES x1 to LCx   CORONARY ANGIOPLASTY WITH STENT PLACEMENT  06-27-2005  dr Riley Kill   PCI and DES x1 to RCA with residual disease LAD 70-80% to manage medically   CYSTOSCOPY N/A 10/27/2020   Procedure: CYSTOSCOPY;  Surgeon: Marguerita Beards, MD;  Location: [facility];  Service: Gynecology;  Laterality: N/A;   CYSTOSCOPY N/A 11/09/2020   Procedure: CYSTOSCOPY;  Surgeon: Marguerita Beards, MD;  Location: [facility];  Service:  Gynecology;  Laterality: N/A;   EYE SURGERY     FOOT SURGERY Left 1990s   left foot stress fracture repair, per pt no hardware   FRACTURE SURGERY     HERNIA REPAIR     HIATAL HERNIA REPAIR  1989   INCISIONAL HERNIA REPAIR N/A 01/25/2021   Procedure: PRIMARY REPAIR OF INCISIONAL HERNIA;  Surgeon: Karie Soda, MD;  Location: [facility];  Service: General;  Laterality: N/A;   INGUINAL HERNIA REPAIR Left 04/17/2021   Procedure: LAPAROSCOPIC LEFT INGUINAL HERNIA REPAIR WITH MESH;  Surgeon: Axel Filler, MD;  Location: [facility];  Service: General;  Laterality: Left;   JOINT REPLACEMENT     KNEE ARTHROSCOPY Bilateral right ?/   left x2 , last one 09-12-2009 @ [facility]   LYSIS OF ADHESION N/A 02/11/2023  Procedure: LYSIS OF ADHESION;  Surgeon: Axel Filler, MD;  Location: [facility];  Service: General;  Laterality: N/A;   PUBOVAGINAL SLING N/A 11/09/2020   Procedure: REVISION OF PUBO-VAGINAL SLING;  Surgeon: Marguerita Beards, MD;  Location: [facility];  Service: Gynecology;  Laterality: N/A;   RECTOCELE REPAIR N/A 04/20/2020   Procedure: POSTERIOR REPAIR (RECTOCELE);  Surgeon: Sherian Rein, MD;  Location: [facility];  Service: Gynecology;  Laterality: N/A;   RECTOCELE REPAIR N/A 10/27/2020   Procedure: POSTERIOR REPAIR (RECTOCELE);  Surgeon: Marguerita Beards, MD;  Location: [facility];  Service: Gynecology;  Laterality: N/A;  total time requested for all procedures is 2 hours   SMALL INTESTINE SURGERY     TOTAL KNEE ARTHROPLASTY  11/12/2011   Procedure: TOTAL KNEE ARTHROPLASTY;  Surgeon: Nilda Simmer, MD;  Location: [facility];  Service: Orthopedics;  Laterality: Left;  Dr Thurston Hole wants 90 minutes for this case   TOTAL KNEE ARTHROPLASTY Right 11/29/2014   Procedure: RIGHT TOTAL KNEE ARTHROPLASTY;  Surgeon: Dannielle Huh, MD;  Location: [facility];  Service: Orthopedics;  Laterality: Right;   UPPER GASTROINTESTINAL ENDOSCOPY  last one 04-25-2017    with dilatation esophageal stricture and savary dilatation   VAGINAL HYSTERECTOMY  1988    no ovaries removed for bleeeding   VENTRAL HERNIA REPAIR N/A 02/11/2023   Procedure: LAPAROSCOPIC ATTEMPTED OPEN VENTRAL HERNIA REPAIR WITH MESH;  Surgeon: Axel Filler, MD;  Location: [facility];  Service: General;  Laterality: N/A;  2.5 HRS    Prior to Admission medications   Medication Sig Start Date End Date Taking? Authorizing Provider  acetaminophen (TYLENOL) 500 MG tablet Take 1,000 mg by mouth every 6 (six) hours as needed for moderate pain.   Yes [provider]  amLODipine (NORVASC) 10 MG tablet TAKE 1 TABLET EVERY DAY 11/19/22  Yes Bradd Canary, MD  amoxicillin (AMOXIL) 500 MG capsule Take 500 mg by mouth 3 (three) times daily. Use before going to the dentist 07/25/23  Yes [provider]  aspirin EC 81 MG tablet Take 1 tablet (81 mg total) by mouth daily. 05/04/15  Yes Tonny Bollman, MD  diclofenac Sodium (VOLTAREN) 1 % GEL Apply 1 application topically 4 (four) times daily as needed (hip pain).   Yes [provider]  estradiol (ESTRACE) 0.1 MG/GM vaginal cream Place 0.5 g vaginally 2 (two) times a week. Place 0.5g nightly for two weeks then twice a week after 03/28/23  Yes Marguerita Beards, MD  furosemide (LASIX) 20 MG tablet Take 20 mg by mouth as needed for fluid.   Yes [provider]  Glycerin, PF, (OPTASE COMFORT DRY EYE) 1 % SOLN Place 1 drop into both eyes in the morning and at bedtime.   Yes [provider]  isosorbide mononitrate (IMDUR) 60 MG 24 hr tablet TAKE 1 AND 1/2 TABLETS EVERY DAY 03/05/23  Yes Tonny Bollman, MD  losartan (COZAAR) 100 MG tablet TAKE 1 TABLET EVERY DAY Patient taking differently: Take 50 mg by mouth 2 (two) times daily. 01/07/23  Yes Tonny Bollman, MD  Magnesium Oxide 250 MG TABS Take 250 mg by mouth every morning.   Yes [provider]  metoprolol succinate (TOPROL-XL) 25 MG 24 hr tablet TAKE 1/2  TABLET EVERY DAY 03/21/23  Yes Bradd Canary, MD  Multiple Vitamins-Minerals (ZINC PO) Take 50 mg by mouth daily. with Vitamin D 2000IU capsule daily   Yes [provider]  Olopatadine HCl (PATADAY) 0.2 %  SOLN Place 1 drop into both eyes daily.   Yes [provider]  omeprazole (PRILOSEC) 20 MG capsule TAKE 1 CAPSULE EVERY DAY AS NEEDED (NEED OFFICE VISIT BEFORE ANY FURTHER REFILLS) Patient taking differently: Take 20 mg by mouth every morning. 07/11/22  Yes Bradd Canary, MD  pramipexole (MIRAPEX) 0.25 MG tablet TAKE 1 TABLET TWICE DAILY 10/08/22  Yes Bradd Canary, MD  Vibegron 75 MG TABS Take 1 tablet (75 mg total) by mouth daily. Patient taking differently: Take 1 tablet by mouth daily as needed (Urinary Issues). 03/28/23  Yes Marguerita Beards, MD  Blood Glucose Monitoring Suppl (TRUE METRIX AIR GLUCOSE METER) w/Device KIT USE TO CHECK BLOOD SUGAR ONCE DAILY AND AS NEEDED.  DX CODE E11.9 07/27/20   Bradd Canary, MD  famotidine (PEPCID) 20 MG tablet TAKE 1 TABLET AT BEDTIME AS NEEDED FOR HEARTBURN OR INDIGESTION. Patient not taking: Reported on 08/09/2023 06/14/23   Hyman Hopes B, NP  glucose blood (TRUE METRIX BLOOD GLUCOSE TEST) test strip TEST BLOOD SUGAR EVERY DAY  OR AS NEEDED 09/21/21   Bradd Canary, MD  simvastatin (ZOCOR) 20 MG tablet TAKE 1 TABLET AT BEDTIME 02/01/23   Tonny Bollman, MD  TRUEplus Lancets 33G MISC USE TO CHECK BLOOD SUGAR ONCE DAILY AND AS NEEDED.  DX CODE: E11.9 07/27/20   Bradd Canary, MD    Current Facility-Administered Medications  Medication Dose Route Frequency Provider Last Rate Last Admin   0.9 %  sodium chloride infusion   Intravenous Continuous Katrinka Blazing, Rondell A, MD       acetaminophen (TYLENOL) tablet 650 mg  650 mg Oral Q6H PRN Clydie Braun, MD       Or   acetaminophen (TYLENOL) suppository 650 mg  650 mg Rectal Q6H PRN Madelyn Flavors A, MD       albuterol (PROVENTIL) (2.5 MG/3ML) 0.083% nebulizer solution 2.5 mg  2.5 mg  Nebulization Q6H PRN Katrinka Blazing, Rondell A, MD       enoxaparin (LOVENOX) injection 40 mg  40 mg Subcutaneous Daily Smith, Rondell A, MD       ondansetron (ZOFRAN) tablet 4 mg  4 mg Oral Q6H PRN Madelyn Flavors A, MD       Or   ondansetron (ZOFRAN) injection 4 mg  4 mg Intravenous Q6H PRN Smith, Rondell A, MD       pantoprazole (PROTONIX) injection 40 mg  40 mg Intravenous Q12H Smith, Rondell A, MD       sodium chloride flush (NS) 0.9 % injection 3 mL  3 mL Intravenous Q12H Smith, Rondell A, MD       Current Outpatient Medications  Medication Sig Dispense Refill   acetaminophen (TYLENOL) 500 MG tablet Take 1,000 mg by mouth every 6 (six) hours as needed for moderate pain.     amLODipine (NORVASC) 10 MG tablet TAKE 1 TABLET EVERY DAY 90 tablet 3   amoxicillin (AMOXIL) 500 MG capsule Take 500 mg by mouth 3 (three) times daily. Use before going to the dentist     aspirin EC 81 MG tablet Take 1 tablet (81 mg total) by mouth daily.     diclofenac Sodium (VOLTAREN) 1 % GEL Apply 1 application topically 4 (four) times daily as needed (hip pain).     estradiol (ESTRACE) 0.1 MG/GM vaginal cream Place 0.5 g vaginally 2 (two) times a week. Place 0.5g nightly for two weeks then twice a week after 30 g 11   furosemide (LASIX) 20 MG  tablet Take 20 mg by mouth as needed for fluid.     Glycerin, PF, (OPTASE COMFORT DRY EYE) 1 % SOLN Place 1 drop into both eyes in the morning and at bedtime.     isosorbide mononitrate (IMDUR) 60 MG 24 hr tablet TAKE 1 AND 1/2 TABLETS EVERY DAY 135 tablet 3   losartan (COZAAR) 100 MG tablet TAKE 1 TABLET EVERY DAY (Patient taking differently: Take 50 mg by mouth 2 (two) times daily.) 90 tablet 1   Magnesium Oxide 250 MG TABS Take 250 mg by mouth every morning.     metoprolol succinate (TOPROL-XL) 25 MG 24 hr tablet TAKE 1/2 TABLET EVERY DAY 45 tablet 3   Multiple Vitamins-Minerals (ZINC PO) Take 50 mg by mouth daily. with Vitamin D 2000IU capsule daily     Olopatadine HCl (PATADAY)  0.2 % SOLN Place 1 drop into both eyes daily.     omeprazole (PRILOSEC) 20 MG capsule TAKE 1 CAPSULE EVERY DAY AS NEEDED (NEED OFFICE VISIT BEFORE ANY FURTHER REFILLS) (Patient taking differently: Take 20 mg by mouth every morning.) 90 capsule 3   pramipexole (MIRAPEX) 0.25 MG tablet TAKE 1 TABLET TWICE DAILY 180 tablet 3   Vibegron 75 MG TABS Take 1 tablet (75 mg total) by mouth daily. (Patient taking differently: Take 1 tablet by mouth daily as needed (Urinary Issues).) 30 tablet 11   Blood Glucose Monitoring Suppl (TRUE METRIX AIR GLUCOSE METER) w/Device KIT USE TO CHECK BLOOD SUGAR ONCE DAILY AND AS NEEDED.  DX CODE E11.9 1 kit 0   famotidine (PEPCID) 20 MG tablet TAKE 1 TABLET AT BEDTIME AS NEEDED FOR HEARTBURN OR INDIGESTION. (Patient not taking: Reported on 08/09/2023) 90 tablet 3   glucose blood (TRUE METRIX BLOOD GLUCOSE TEST) test strip TEST BLOOD SUGAR EVERY DAY  OR AS NEEDED 200 strip 12   simvastatin (ZOCOR) 20 MG tablet TAKE 1 TABLET AT BEDTIME 90 tablet 1   TRUEplus Lancets 33G MISC USE TO CHECK BLOOD SUGAR ONCE DAILY AND AS NEEDED.  DX CODE: E11.9 200 each 1    Allergies as of 08/08/2023 - Review Complete 08/08/2023  Allergen Reaction Noted   Baclofen Other (See Comments) 05/04/2015   Gabapentin Other (See Comments) 02/01/2022   Nitrofurantoin Rash 07/14/2014    Family History  Problem Relation Age of Onset   Stroke Father        family hx of M 1st degree relative <50   Heart disease Father    Coronary artery disease Mother    Heart disease Mother    Arthritis Mother    Hearing loss Mother    Vision loss Mother    Depression Brother    Cancer Brother    Stroke Brother    Diabetes Brother    Cancer Brother        bladder with mets   Diabetes Daughter        borderline   Hypertension Daughter    Arthritis Other        family hx of   Hypertension Other        family hx of   Other Other        family hx of cardiovascular disorder   Thyroid disease Daughter     Early death Daughter    Breast cancer Neg Hx    Colon cancer Neg Hx    Anesthesia problems Neg Hx    Hypotension Neg Hx    Malignant hyperthermia Neg Hx    Pseudochol deficiency  Neg Hx    Colon polyps Neg Hx    Esophageal cancer Neg Hx    Rectal cancer Neg Hx    Stomach cancer Neg Hx     Social History   Socioeconomic History   Marital status: Widowed    Spouse name: Not on file   Number of children: 3   Years of education: 65   Highest education level: Some college, no degree  Occupational History   Occupation: works partime in Public affairs consultant: RETIRED    Comment: retired  Tobacco Use   Smoking status: Never   Smokeless tobacco: Never  Vaping Use   Vaping status: Never Used  Substance and Sexual Activity   Alcohol use: No    Alcohol/week: 0.0 standard drinks of alcohol   Drug use: Never   Sexual activity: Not Currently    Birth control/protection: Surgical    Comment: lives alone  Other Topics Concern   Not on file  Social History Narrative   Lives with husband, Caffeine use- Half Caffeine)- 2 cups daily.  3 children living, one passed away.   Education: HS. Business college.  Retired.    Social Drivers of Corporate investment banker Strain: Low Risk  (08/05/2023)   Overall Financial Resource Strain (CARDIA)    Difficulty of Paying Living Expenses: Not hard at all  Food Insecurity: No Food Insecurity (08/05/2023)   Hunger Vital Sign    Worried About Running Out of Food in the Last Year: Never true    Ran Out of Food in the Last Year: Never true  Transportation Needs: No Transportation Needs (08/05/2023)   PRAPARE - Administrator, Civil Service (Medical): No    Lack of Transportation (Non-Medical): No  Physical Activity: Inactive (08/05/2023)   Exercise Vital Sign    Days of Exercise per Week: 0 days    Minutes of Exercise per Session: 0 min  Stress: Stress Concern Present (08/05/2023)   [employer] of Occupational Health - Occupational  Stress Questionnaire    Feeling of Stress : Very much  Social Connections: Moderately Isolated (08/05/2023)   Social Connection and Isolation Panel [NHANES]    Frequency of Communication with Friends and Family: More than three times a week    Frequency of Social Gatherings with Friends and Family: Once a week    Attends Religious Services: More than 4 times per year    Active Member of [employer] or Organizations: No    Attends Banker Meetings: Never    Marital Status: Widowed  Intimate Partner Violence: Not At Risk (08/06/2023)   Humiliation, Afraid, Rape, and Kick questionnaire    Fear of Current or Ex-Partner: No    Emotionally Abused: No    Physically Abused: No    Sexually Abused: No    Review of Systems: ROS is O/W negative except as mentioned in HPI.  Physical Exam: Vital signs in last 24 hours: Temp:  [97.9 F (36.6 C)-98.2 F (36.8 C)] 98 F (36.7 C) (01/31 0350) Pulse Rate:  [55-73] 73 (01/31 0730) Resp:  [12-23] 17 (01/31 0730) BP: (136-174)/(51-104) 143/72 (01/31 0730) SpO2:  [98 %-100 %] 99 % (01/31 0730)   General:  Alert, Elderly but Well-developed, well-nourished, pleasant and cooperative in NAD Head:  Normocephalic and atraumatic. Eyes:  Sclera clear, no icterus.  Conjunctiva pink. Ears:  Normal auditory acuity. Mouth:  No deformity or lesions.   Lungs:  Clear throughout to auscultation.  No wheezes, crackles, or  rhonchi.  Heart:  Regular rate and rhythm; no murmurs, clicks, rubs, or gallops. Abdomen:  Soft, non-distended.  BS present.  Epigastric TTP.  Scar noted from hernia repair incision.    Msk:  Symmetrical without gross deformities. Pulses:  Normal pulses noted. Extremities:  Some swelling/edema in RLE. Neurologic:  Alert and oriented x 4;  grossly normal neurologically. Skin:  Intact without significant lesions or rashes. Psych:  Alert and cooperative. Normal mood and affect.  Lab Results: Recent Labs    08/08/23 1825  WBC 6.2  HGB  13.4  HCT 41.0  PLT 176   BMET Recent Labs    08/08/23 1825  NA 140  K 3.9  CL 105  CO2 26  GLUCOSE 120*  BUN 19  CREATININE 0.96  CALCIUM 9.3   LFT Recent Labs    08/08/23 1825  PROT 7.0  ALBUMIN 4.1  AST 251*  ALT 106*  ALKPHOS 148*  BILITOT 0.8  BILIDIR 0.2  IBILI 0.6   Studies/Results: CT Angio Abd/Pel W and/or Wo Contrast Result Date: 08/09/2023 CLINICAL DATA:  Abdominal pain EXAM: CTA ABDOMEN AND PELVIS WITHOUT AND WITH CONTRAST TECHNIQUE: Multidetector CT imaging of the abdomen and pelvis was performed using the standard protocol during bolus administration of intravenous contrast. Multiplanar reconstructed images and MIPs were obtained and reviewed to evaluate the vascular anatomy. RADIATION DOSE REDUCTION: This exam was performed according to the departmental dose-optimization program which includes automated exposure control, adjustment of the mA and/or kV according to patient size and/or use of iterative reconstruction technique. CONTRAST:  75mL OMNIPAQUE IOHEXOL 350 MG/ML SOLN COMPARISON:  08/07/2023 FINDINGS: VASCULAR Aorta: Atherosclerotic calcifications are noted. No aneurysmal dilatation or dissection is seen. Celiac: Plaque is noted at the origin with mild stenosis. No significant poststenotic dilatation is seen. SMA: Patent without evidence of aneurysm, dissection, vasculitis or significant stenosis. Renals: Both renal arteries are patent without evidence of aneurysm, dissection, vasculitis, fibromuscular dysplasia or significant stenosis. IMA: Patent without evidence of aneurysm, dissection, vasculitis or significant stenosis. Inflow: Iliacs demonstrate atherosclerotic calcification without focal abnormality. Veins: No specific venous abnormality is noted. Review of the MIP images confirms the above findings. NON-VASCULAR Lower chest: No acute abnormality. Hepatobiliary: No focal liver abnormality is seen. Status post cholecystectomy. No biliary dilatation.  Pancreas: Unremarkable. No pancreatic ductal dilatation or surrounding inflammatory changes. Spleen: Normal in size without focal abnormality. Adrenals/Urinary Tract: Adrenal glands are within normal limits. Kidneys are well visualize within normal enhancement pattern. Simple renal cysts are noted on the right. No further follow-up is recommended. The ureters are within normal limits. The bladder is decompressed. Air is seen within the bladder stable from the prior exam. Stomach/Bowel: Mild diverticular change of the colon is noted. No diverticulitis is seen. The appendix is within normal limits. Stomach shows a large hiatal hernia. Small bowel is within normal limits. Lymphatic: Aortic atherosclerosis. No enlarged abdominal or pelvic lymph nodes. Reproductive: Status post hysterectomy. No adnexal masses. Other: Postsurgical changes are noted in the anterior abdominal wall consistent with prior ventral hernia repair. No recurrent hernia is seen. The previously seen fluid collection in the operative bed is not well visualized on today's exam. Musculoskeletal: No acute bony abnormality noted. IMPRESSION: VASCULAR Scattered atherosclerotic calcifications are noted without complicating factors. No acute findings to suggest mesenteric ischemia are noted. NON-VASCULAR Diverticular change without diverticulitis. No acute abnormality is noted. Electronically Signed   By: Alcide Clever M.D.   On: 08/09/2023 02:11   CT ABDOMEN PELVIS WO CONTRAST Result Date:  08/08/2023 CLINICAL DATA:  Supraumbilical pain after hernia repair. EXAM: CT ABDOMEN AND PELVIS WITHOUT CONTRAST TECHNIQUE: Multidetector CT imaging of the abdomen and pelvis was performed following the standard protocol without IV contrast. RADIATION DOSE REDUCTION: This exam was performed according to the departmental dose-optimization program which includes automated exposure control, adjustment of the mA and/or kV according to patient size and/or use of iterative  reconstruction technique. COMPARISON:  CT 03/08/2023 FINDINGS: Lower chest: Calcified granuloma in the right lower lobe. Moderate-sized hiatal hernia. No basilar airspace disease or pleural effusion. Hepatobiliary: Unremarkable unenhanced appearance of the liver. Clips in the gallbladder fossa postcholecystectomy. No biliary dilatation. Pancreas: No ductal dilatation or inflammation. Spleen: Normal in size without focal abnormality. Adrenals/Urinary Tract: No adrenal nodule. Bilateral renal parenchymal thinning with lobulated renal contours. Right renal cysts. No further follow-up imaging is recommended. No hydronephrosis or renal calculi. Pelvic floor descent with cystocele, tiny focus of air in the bladder. No bladder wall thickening. Stomach/Bowel: Moderate-sized hiatal hernia. The distal stomach is decompressed. There is no bowel obstruction or inflammation. Ingested pills in the cecum. The appendix is not definitively seen. Moderate volume of colonic stool. Left colonic diverticulosis. No diverticulitis. Vascular/Lymphatic: Aortic and branch atherosclerosis. No aortic aneurysm. No enlarged lymph nodes in the abdomen or pelvis. Reproductive: Status post hysterectomy. No adnexal masses. Other: Prior upper ventral abdominal wall hernia repair. Improvement in subcutaneous stranding from prior exam. Small elliptoid fluid persists in the sub muscular layer measuring 3.7 x 1.3 cm, series 3, image 28. No recurrent hernia. There is no surrounding fat stranding to suggest inflammation. No free air or free fluid. Musculoskeletal: Multilevel degenerative change in the spine, minimal scoliosis. Mild bilateral hip degenerative change. There are no acute or suspicious osseous abnormalities. IMPRESSION: 1. Prior upper ventral abdominal wall hernia repair. Improvement in subcutaneous stranding from prior exam. Small elliptoid fluid persists in the sub muscular layer measuring 3.7 x 1.3 cm, likely postoperative seroma. No  recurrent hernia. 2. Moderate-sized hiatal hernia. 3. Left colonic diverticulosis without diverticulitis. Aortic Atherosclerosis (ICD10-I70.0). Electronically Signed   By: Narda Rutherford M.D.   On: 08/08/2023 22:33   DG Chest 2 View Result Date: 08/08/2023 CLINICAL DATA:  Chest pain EXAM: CHEST - 2 VIEW COMPARISON:  02/13/2023 FINDINGS: The heart size and mediastinal contours are within normal limits. Aortic atherosclerosis. No focal airspace consolidation, pleural effusion, or pneumothorax. The visualized skeletal structures are unremarkable. IMPRESSION: No active cardiopulmonary disease. Electronically Signed   By: Duanne Guess D.O.   On: 08/08/2023 19:44   IMPRESSION:  89 year old female with months of recurrent episodes of upper and primarily epigastric abdominal pain with associated nausea and vomiting.  Episodes usually occur once a week and resolve in 30 mins but this week had several episodes and lasted longer.  Has elevated LFTs which are newly elevated from earlier this week.  CT scan ok but ultrasound describes some minimal central intrahepatic biliary duct dilatation.  Has lost 15 pounds recently entheses most likely from food avoidance due to the symptoms).  She describes the pain is similar to the biliary colic she was experiencing prior to 2022 cholecystectomy  CGE:  Had an episode of this during her vomiting this week/yesterday.  Hgb is normal at 12.3 grams.  Could have some esophagitis from the several episodes of vomiting that have occurred this week.  PLAN: -Is on pantoprazole 40 mg IV BID here and should continue that. -Trend LFTs. -? MRCP.   Princella Pellegrini. Zehr  08/09/2023, 9:09 AM  I have taken an interval history, thoroughly reviewed the chart and examined the patient. I agree with the Advanced Practitioner's note, impression and recommendations, and have recorded additional findings, impressions and recommendations below. I performed a substantive portion of this encounter  (>50% time spent), including a complete performance of the medical decision making.  My additional thoughts are as follows:  Overall clinical picture most consistent with choledocholithiasis without cholangitis. Reported coffee-ground's emesis/hematemesis after vomiting.  No evidence of brisk GI bleeding, initial hemoglobin normal, hemodynamically stable.  Plan is for an MRCP as well as labs this evening(LFTs, CBC) and a.m. labs including CMP, CBC and INR  Discussed all this with her and her daughter along with the possible need for ERCP if stones are confirmed on MR study.  Diagrams of the anatomy shown.  Meanwhile, I think she should stay on a clear liquid diet to decrease the chance of precipitating of the pain episode and in the event that she may need an ERCP tomorrow.  Will make her n.p.o. after midnight in case procedure tomorrow is necessary and feasible.   Even if the MR study does show choledocholithiasis, I do not know for certain that tomorrow will be the ERCP day, as it depends on the volume and availability of weekend endoscopy procedures and staffing and our biliary endoscopist.  Regarding the coffee-ground's emesis, no immediate plan for endoscopy as it sounds like some benign upper GI mucosal bleeding precipitated by vomiting.  If it comes to ERCP, then it would be endoscopically evaluated with that.  Charlie Pitter III [phone]

## 2023-08-09 NOTE — ED Notes (Signed)
 Patient transported to MRI

## 2023-08-09 NOTE — ED Notes (Signed)
Patient transported to Imaging.

## 2023-08-09 NOTE — ED Notes (Signed)
Call placed to CCMD and PT placed on monitor.

## 2023-08-09 NOTE — H&P (Incomplete)
History and Physical    Patient: Heather [name] ZOX:[id number] DOB: Oct 17, 1934 DOA: 08/08/2023 DOS: the patient was seen and examined on 08/09/2023 PCP: Bradd Canary, MD  Patient coming from: Home via EMS  Chief Complaint:  Chief Complaint  Patient presents with   Chest Pain   HPI: Heather [name] is a 89 y.o. female with medical history significant of hypertension, hyperlipidemia, chronic HFpEF, CAD, diabetes mellitus type 2 presented with complaints of chest pain.      En route with EMS patient was given full dose aspirin and 0.4 mg of nitroglycerin.  Upon admission into the emergency department patient was noted to be afebrile, heart rate 55-68, respirations 12-23, blood pressure 136/72-174/65, and O2 saturations maintained on room air.  Labs significant for alkaline phosphatase 148, lipase 70, AST 251, ALT 106, and troponin 9->7.  Chest x-ray noted no acute abnormality.  CT abdomen and pelvis noted scattered atherosclerotic calcifications without complicating features, large hiatal hernia, and diverticulosis without signs of diverticulitis.  Patient was given Protonix 40 mg IV and GI cocktail.   Review of Systems: {ROS_Text:26778} Past Medical History:  Diagnosis Date   Aortic atherosclerosis (HCC) 10/12/2021   Cancer (HCC)    Uterine   Cataract    Chronic heart failure with preserved ejection fraction (HFpEF) (HCC)    Chronic stable angina (HCC)    Coronary artery disease cardiologist--- dr Excell Seltzer   Diabetes mellitus type 2, diet-controlled (HCC)    followed by pcp  (10-19-2020  pt stated checks daily in am,  fasting blood surgar--- 115--120s)   DOE (dyspnea on exertion)    per pt "when I over do",  ok with household chores   Echocardiogram 08/2020    Echocardiogram 2/22: EF 55-60, no RWMA, mild LVH, Gr 2 DD, GLS-21.7%, normal RVSF, trivial MR, RVSP 39.5   Edema of right lower extremity    GERD (gastroesophageal reflux disease)    Hiatal hernia    recurrence,  hx HH  repair 1989   History of cervical cancer    s/p  vaginal hysterectomy   History of DVT of lower extremity 2016   11-29-2014 post op right TKA of right lower extremity and completed xarelto    History of esophageal stricture    hx s/p dilatation's   History of gastric ulcer 2005 approx.   History of palpitations 2010   event monitor 07-07-2009 showed NSR w/ freq. SVT ectopies with short runs, rare PVCs   History of TIA (transient ischemic attack) 06/1999   12-15-2019  per pt had several TIA between 12/ 2000 to 02/ 2001 , was sent to specialist @[facility] , had test that was normal (10-19-2020 pt stated no TIAs since ) but has residual of essential tremors of right arm/ hand   Hypertension    Intermittent palpitations    IT band syndrome    Migraine    "ice pick headche lasts about 30 seconds"   Mixed hyperlipidemia    Mixed incontinence urge and stress    Multiple thyroid nodules    followed by pcp---   ultrasound 11-22-2014 no bx   (12-15-2019 per pt had a endocrinologist and was told did not need bx)   OA (osteoarthritis)    knees, elbow, hip, ankles   Occasional tremors    right arm/ hand  s/p TIA residual 2000   Osteoporosis    taking vitamin d   Peroneal DVT (deep venous thrombosis) (HCC) 12/22/2014   Right bundle branch block (RBBB) with left  anterior fascicular block (LAFB)    RLS (restless legs syndrome)    S/P drug eluting coronary stent placement 2006   03-16-2005  PCI x1 DES to LCx;   06-27-2005  PCI x1 DES to RCA   Stroke [facility])    TIA's   Urinary retention    post op sling prodecure on 10-27-2020, has foley cathether   Past Surgical History:  Procedure Laterality Date   ANTERIOR AND POSTERIOR REPAIR N/A 12/22/2019   Procedure: ANTERIOR (CYSTOCELE)  REPAIR;  Surgeon: Sherian Rein, MD;  Location: [facility];  Service: Gynecology;  Laterality: N/A;   ANTERIOR AND POSTERIOR REPAIR WITH SACROSPINOUS FIXATION N/A 10/27/2020   Procedure: SACROSPINOUS  LIGAMENT FIXATION;  Surgeon: Marguerita Beards, MD;  Location: [facility];  Service: Gynecology;  Laterality: N/A;   BLADDER SUSPENSION N/A 10/27/2020   Procedure: TRANSVAGINAL TAPE (TVT) PROCEDURE;  Surgeon: Marguerita Beards, MD;  Location: [facility];  Service: Gynecology;  Laterality: N/A;   CATARACT EXTRACTION W/ INTRAOCULAR LENS  IMPLANT, BILATERAL  2015   CHOLECYSTECTOMY N/A 01/25/2021   Procedure: LAPAROSCOPIC CHOLECYSTECTOMY WITH INTRAOPERATIVE CHOLANGIOGRAM AND LYSIS OF ADHESIONS;  Surgeon: Karie Soda, MD;  Location: [facility];  Service: General;  Laterality: N/A;   COLONOSCOPY  last one ?   CORONARY ANGIOPLASTY WITH STENT PLACEMENT  03-16-2005   dr Riley Kill   PCI and DES x1 to LCx   CORONARY ANGIOPLASTY WITH STENT PLACEMENT  06-27-2005  dr Riley Kill   PCI and DES x1 to RCA with residual disease LAD 70-80% to manage medically   CYSTOSCOPY N/A 10/27/2020   Procedure: CYSTOSCOPY;  Surgeon: Marguerita Beards, MD;  Location: [facility];  Service: Gynecology;  Laterality: N/A;   CYSTOSCOPY N/A 11/09/2020   Procedure: CYSTOSCOPY;  Surgeon: Marguerita Beards, MD;  Location[facility];  Service: Gynecology;  Laterality: N/A;   EYE SURGERY     FOOT SURGERY Left 1990s   left foot stress fracture repair, per pt no hardware   FRACTURE SURGERY     HERNIA REPAIR     HIATAL HERNIA REPAIR  1989   INCISIONAL HERNIA REPAIR N/A 01/25/2021   Procedure: PRIMARY REPAIR OF INCISIONAL HERNIA;  Surgeon: Karie Soda, MD;  Location: [facility];  Service: General;  Laterality: N/A;   INGUINAL HERNIA REPAIR Left 04/17/2021   Procedure: LAPAROSCOPIC LEFT INGUINAL HERNIA REPAIR WITH MESH;  Surgeon: Axel Filler, MD;  Location: [facility];  Service: General;  Laterality: Left;   JOINT REPLACEMENT     KNEE ARTHROSCOPY Bilateral right ?/   left x2 , last one 09-12-2009 @ [facility]   LYSIS OF ADHESION N/A 02/11/2023   Procedure: LYSIS OF  ADHESION;  Surgeon: Axel Filler, MD;  Location: [facility];  Service: General;  Laterality: N/A;   PUBOVAGINAL SLING N/A 11/09/2020   Procedure: REVISION OF Leonides Grills;  Surgeon: Marguerita Beards, MD;  Location: [facility];  Service: Gynecology;  Laterality: N/A;   RECTOCELE REPAIR N/A 04/20/2020   Procedure: POSTERIOR REPAIR (RECTOCELE);  Surgeon: Sherian Rein, MD;  Location: [facility];  Service: Gynecology;  Laterality: N/A;   RECTOCELE REPAIR N/A 10/27/2020   Procedure: POSTERIOR REPAIR (RECTOCELE);  Surgeon: Marguerita Beards, MD;  Location: [facility];  Service: Gynecology;  Laterality: N/A;  total time requested for all procedures is 2 hours   SMALL INTESTINE SURGERY     TOTAL KNEE ARTHROPLASTY  11/12/2011   Procedure: TOTAL KNEE  ARTHROPLASTY;  Surgeon: Nilda Simmer, MD;  Location: [facility] OR;  Service: Orthopedics;  Laterality: Left;  Dr Thurston Hole wants 90 minutes for this case   TOTAL KNEE ARTHROPLASTY Right 11/29/2014   Procedure: RIGHT TOTAL KNEE ARTHROPLASTY;  Surgeon: Dannielle Huh, MD;  Location: [facility];  Service: Orthopedics;  Laterality: Right;   UPPER GASTROINTESTINAL ENDOSCOPY  last one 04-25-2017   with dilatation esophageal stricture and savary dilatation   VAGINAL HYSTERECTOMY  1988    no ovaries removed for bleeeding   VENTRAL HERNIA REPAIR N/A 02/11/2023   Procedure: LAPAROSCOPIC ATTEMPTED OPEN VENTRAL HERNIA REPAIR WITH MESH;  Surgeon: Axel Filler, MD;  Location: [facility] OR;  Service: General;  Laterality: N/A;  2.5 HRS   Social History:  reports that she has never smoked. She has never used smokeless tobacco. She reports that she does not drink alcohol and does not use drugs.  Allergies  Allergen Reactions   Baclofen Other (See Comments)    Hyperactivity    Gabapentin Other (See Comments)    Made her hyper   Nitrofurantoin Rash    Family History  Problem Relation Age of Onset   Stroke Father         family hx of M 1st degree relative <50   Heart disease Father    Coronary artery disease Mother    Heart disease Mother    Arthritis Mother    Hearing loss Mother    Vision loss Mother    Depression Brother    Cancer Brother    Stroke Brother    Diabetes Brother    Cancer Brother        bladder with mets   Diabetes Daughter        borderline   Hypertension Daughter    Arthritis Other        family hx of   Hypertension Other        family hx of   Other Other        family hx of cardiovascular disorder   Thyroid disease Daughter    Early death Daughter    Breast cancer Neg Hx    Colon cancer Neg Hx    Anesthesia problems Neg Hx    Hypotension Neg Hx    Malignant hyperthermia Neg Hx    Pseudochol deficiency Neg Hx    Colon polyps Neg Hx    Esophageal cancer Neg Hx    Rectal cancer Neg Hx    Stomach cancer Neg Hx     Prior to Admission medications   Medication Sig Start Date End Date Taking? Authorizing Provider  acetaminophen (TYLENOL) 500 MG tablet Take 1,000 mg by mouth every 6 (six) hours as needed for moderate pain.   Yes [provider]  amLODipine (NORVASC) 10 MG tablet TAKE 1 TABLET EVERY DAY 11/19/22  Yes Bradd Canary, MD  amoxicillin (AMOXIL) 500 MG capsule Take 500 mg by mouth 3 (three) times daily. Use before going to the dentist 07/25/23  Yes [provider]  aspirin EC 81 MG tablet Take 1 tablet (81 mg total) by mouth daily. 05/04/15  Yes Tonny Bollman, MD  diclofenac Sodium (VOLTAREN) 1 % GEL Apply 1 application topically 4 (four) times daily as needed (hip pain).   Yes [provider]  estradiol (ESTRACE) 0.1 MG/GM vaginal cream Place 0.5 g vaginally 2 (two) times a week. Place 0.5g nightly for two weeks then twice a week after 03/28/23  Yes Marguerita Beards, MD  furosemide (LASIX)  20 MG tablet Take 20 mg by mouth as needed for fluid.   Yes [provider]  Glycerin, PF, (OPTASE COMFORT DRY EYE) 1 % SOLN Place 1  drop into both eyes in the morning and at bedtime.   Yes [provider]  isosorbide mononitrate (IMDUR) 60 MG 24 hr tablet TAKE 1 AND 1/2 TABLETS EVERY DAY 03/05/23  Yes Tonny Bollman, MD  losartan (COZAAR) 100 MG tablet TAKE 1 TABLET EVERY DAY Patient taking differently: Take 50 mg by mouth 2 (two) times daily. 01/07/23  Yes Tonny Bollman, MD  Magnesium Oxide 250 MG TABS Take 250 mg by mouth every morning.   Yes [provider]  metoprolol succinate (TOPROL-XL) 25 MG 24 hr tablet TAKE 1/2 TABLET EVERY DAY 03/21/23  Yes Bradd Canary, MD  Multiple Vitamins-Minerals (ZINC PO) Take 50 mg by mouth daily. with Vitamin D 2000IU capsule daily   Yes [provider]  Olopatadine HCl (PATADAY) 0.2 % SOLN Place 1 drop into both eyes daily.   Yes [provider]  omeprazole (PRILOSEC) 20 MG capsule TAKE 1 CAPSULE EVERY DAY AS NEEDED (NEED OFFICE VISIT BEFORE ANY FURTHER REFILLS) Patient taking differently: Take 20 mg by mouth every morning. 07/11/22  Yes Bradd Canary, MD  pramipexole (MIRAPEX) 0.25 MG tablet TAKE 1 TABLET TWICE DAILY 10/08/22  Yes Bradd Canary, MD  Vibegron 75 MG TABS Take 1 tablet (75 mg total) by mouth daily. Patient taking differently: Take 1 tablet by mouth daily as needed (Urinary Issues). 03/28/23  Yes Marguerita Beards, MD  Blood Glucose Monitoring Suppl (TRUE METRIX AIR GLUCOSE METER) w/Device KIT USE TO CHECK BLOOD SUGAR ONCE DAILY AND AS NEEDED.  DX CODE E11.9 07/27/20   Bradd Canary, MD  famotidine (PEPCID) 20 MG tablet TAKE 1 TABLET AT BEDTIME AS NEEDED FOR HEARTBURN OR INDIGESTION. Patient not taking: Reported on 08/09/2023 06/14/23   Hyman Hopes B, NP  glucose blood (TRUE METRIX BLOOD GLUCOSE TEST) test strip TEST BLOOD SUGAR EVERY DAY  OR AS NEEDED 09/21/21   Bradd Canary, MD  simvastatin (ZOCOR) 20 MG tablet TAKE 1 TABLET AT BEDTIME 02/01/23   Tonny Bollman, MD  TRUEplus Lancets 33G MISC USE TO CHECK BLOOD SUGAR ONCE DAILY AND AS  NEEDED.  DX CODE: E11.9 07/27/20   Bradd Canary, MD    Physical Exam: Vitals:   08/09/23 0530 08/09/23 0545 08/09/23 0645 08/09/23 0730  BP: (!) 147/72 (!) 147/56 (!) 152/78 (!) 143/72  Pulse: 65 (!) 58 65 73  Resp: 12 14 15 17   Temp:      TempSrc:      SpO2: 100% 99% 99% 99%   *** Data Reviewed: {Tip this will not be part of the note when signed- Document your independent interpretation of telemetry tracing, EKG, lab, Radiology test or any other diagnostic tests. Add any new diagnostic test ordered today. (Optional):26781} EKG revealed sinus rhythm with marked sinus arrhythmia, RBBB, and LAFB.  Otherwise similar to prior tracing from 03/08/2023.  Reviewed labs, imaging, and pertinent records as documented.  Assessment and Plan:  Chest/epigastric abdominal pain Transaminitis Acute.  Patient presented with complaints of chest pain.  Labs significant for high-sensitivity troponins were negative x 2, alkaline phosphatase 148, lipase 70, AST 251, ALT 106.  EKG did not note any significant ischemic changes.  Chest x-ray noted no acute abnormality.  CT of the abdomen pelvis did not note any significant findings to suggest mesenteric ischemia or acute intra-abdominal  abnormality.  Gastroenterology hadbeen formally consulted. -Admit to a telemetry bed -Check hepatitis panel -Check abdominal ultrasound -Continue to monitor LFTs -Normal saline IV fluids 75 mL/h   Hiatal hernia GERD   Controlled diabetes mellitus type 2, without long-term use of insulin  Last hemoglobin A1c 6.8 when checked on 08/05/2023     Advance Care Planning:   Code Status: Prior ***  Consults: ***  Family Communication: ***  Severity of Illness: {Observation/Inpatient:21159}  Author: Clydie Braun, MD 08/09/2023 8:34 AM  For on call review [email or web address].

## 2023-08-10 DIAGNOSIS — I10 Essential (primary) hypertension: Secondary | ICD-10-CM | POA: Diagnosis not present

## 2023-08-10 DIAGNOSIS — R7989 Other specified abnormal findings of blood chemistry: Secondary | ICD-10-CM | POA: Diagnosis not present

## 2023-08-10 DIAGNOSIS — K920 Hematemesis: Secondary | ICD-10-CM | POA: Diagnosis not present

## 2023-08-10 DIAGNOSIS — R7401 Elevation of levels of liver transaminase levels: Secondary | ICD-10-CM | POA: Diagnosis not present

## 2023-08-10 DIAGNOSIS — K921 Melena: Secondary | ICD-10-CM

## 2023-08-10 DIAGNOSIS — R1013 Epigastric pain: Secondary | ICD-10-CM | POA: Diagnosis not present

## 2023-08-10 LAB — BASIC METABOLIC PANEL
Anion gap: 9 (ref 5–15)
BUN: 8 mg/dL (ref 8–23)
CO2: 24 mmol/L (ref 22–32)
Calcium: 9.1 mg/dL (ref 8.9–10.3)
Chloride: 108 mmol/L (ref 98–111)
Creatinine, Ser: 0.75 mg/dL (ref 0.44–1.00)
GFR, Estimated: >60 mL/min (ref >60)
Glucose, Bld: 81 mg/dL (ref 70–99)
Potassium: 3.6 mmol/L (ref 3.5–5.1)
Sodium: 141 mmol/L (ref 135–145)

## 2023-08-10 LAB — HEPATIC FUNCTION PANEL
ALT: 92 U/L — ABNORMAL HIGH (ref 0–44)
AST: 58 U/L — ABNORMAL HIGH (ref 15–41)
Albumin: 3.4 g/dL — ABNORMAL LOW (ref 3.5–5.0)
Alkaline Phosphatase: 130 U/L — ABNORMAL HIGH (ref 38–126)
Bilirubin, Direct: <0.1 mg/dL (ref 0.0–0.2)
Total Bilirubin: 0.8 mg/dL (ref 0.0–1.2)
Total Protein: 5.8 g/dL — ABNORMAL LOW (ref 6.5–8.1)

## 2023-08-10 LAB — CBC
HCT: 38.3 % (ref 36.0–46.0)
Hemoglobin: 12.7 g/dL (ref 12.0–15.0)
MCH: 30 pg (ref 26.0–34.0)
MCHC: 33.2 g/dL (ref 30.0–36.0)
MCV: 90.5 fL (ref 80.0–100.0)
Platelets: 134 10*3/uL — ABNORMAL LOW (ref 150–400)
RBC: 4.23 MIL/uL (ref 3.87–5.11)
RDW: 13.2 % (ref 11.5–15.5)
WBC: 4.8 10*3/uL (ref 4.0–10.5)
nRBC: 0 % (ref 0.0–0.2)

## 2023-08-10 LAB — OCCULT BLOOD X 1 CARD TO LAB, STOOL: Fecal Occult Bld: NEGATIVE

## 2023-08-10 LAB — PROTIME-INR
INR: 1.1 (ref 0.8–1.2)
Prothrombin Time: 14.4 s (ref 11.4–15.2)

## 2023-08-10 MED ORDER — POLYVINYL ALCOHOL 1.4 % OP SOLN
1.0000 [drp] | Freq: Two times a day (BID) | OPHTHALMIC | Status: DC
  Administered 2023-08-10 – 2023-08-12 (×5): 1 [drp] via OPHTHALMIC
  Filled 2023-08-10: qty 15

## 2023-08-10 MED ORDER — SIMVASTATIN 20 MG PO TABS: 20.0000 mg | ORAL_TABLET | Freq: Every day | ORAL | Status: DC

## 2023-08-10 MED ORDER — METOPROLOL SUCCINATE ER 25 MG PO TB24
12.5000 mg | ORAL_TABLET | Freq: Every day | ORAL | Status: DC
  Filled 2023-08-10 (×2): qty 1

## 2023-08-10 MED ORDER — PRAMIPEXOLE DIHYDROCHLORIDE 0.25 MG PO TABS
0.2500 mg | ORAL_TABLET | Freq: Two times a day (BID) | ORAL | Status: DC
  Administered 2023-08-10 – 2023-08-12 (×5): 0.25 mg via ORAL
  Filled 2023-08-10 (×5): qty 1

## 2023-08-10 MED ORDER — AMLODIPINE BESYLATE 10 MG PO TABS
10.0000 mg | ORAL_TABLET | Freq: Every day | ORAL | Status: DC
  Administered 2023-08-10 – 2023-08-12 (×3): 10 mg via ORAL
  Filled 2023-08-10 (×3): qty 1

## 2023-08-10 MED ORDER — DICLOFENAC SODIUM 1 % EX GEL: 2.0000 g | Freq: Four times a day (QID) | CUTANEOUS | Status: DC | PRN

## 2023-08-10 MED ORDER — LOSARTAN POTASSIUM 50 MG PO TABS
50.0000 mg | ORAL_TABLET | Freq: Two times a day (BID) | ORAL | Status: DC
  Administered 2023-08-10 – 2023-08-12 (×4): 50 mg via ORAL
  Filled 2023-08-10 (×4): qty 1

## 2023-08-10 MED ORDER — ORAL CARE MOUTH RINSE: 15.0000 mL | OROMUCOSAL | Status: DC | PRN

## 2023-08-10 MED ORDER — ISOSORBIDE MONONITRATE ER 60 MG PO TB24
60.0000 mg | ORAL_TABLET | Freq: Every day | ORAL | Status: DC
  Administered 2023-08-10 – 2023-08-12 (×4): 60 mg via ORAL
  Filled 2023-08-10 (×3): qty 1

## 2023-08-10 MED ORDER — OLOPATADINE HCL 0.1 % OP SOLN
1.0000 [drp] | Freq: Two times a day (BID) | OPHTHALMIC | Status: DC
  Administered 2023-08-10 – 2023-08-12 (×5): 1 [drp] via OPHTHALMIC
  Filled 2023-08-10: qty 5

## 2023-08-10 MED ORDER — MAGNESIUM OXIDE -MG SUPPLEMENT 400 (240 MG) MG PO TABS
400.0000 mg | ORAL_TABLET | Freq: Every morning | ORAL | Status: DC
Start: 2023-08-10 — End: 2023-08-13
  Administered 2023-08-10 – 2023-08-12 (×3): 400 mg via ORAL
  Filled 2023-08-10 (×3): qty 1

## 2023-08-10 NOTE — Progress Notes (Signed)
[facility] Progress Note  Chief Complaint: Epigastric pain and elevated LFTs  History:  She has not had any further episodes of the pain since I saw her in consultation yesterday.  She has not had any more coffee-ground's emesis, but did report that 2 stools were black overnight early this morning.  Has been n.p.o. after midnight, wondering when she can eat and results of her testing.  ROS: Cardiovascular: Denies chest pain Respiratory: Denies dyspnea Urinary: Denies dysuria  Objective:   Current Facility-Administered Medications:    0.9 %  sodium chloride infusion, , Intravenous, Continuous, Katrinka Blazing, Rondell A, MD, Last Rate: 75 mL/hr at 08/10/23 0242, New Bag at 08/10/23 0242   acetaminophen (TYLENOL) tablet 650 mg, 650 mg, Oral, Q6H PRN, 650 mg at 08/09/23 2249 **OR** acetaminophen (TYLENOL) suppository 650 mg, 650 mg, Rectal, Q6H PRN, Katrinka Blazing, Rondell A, MD   albuterol (PROVENTIL) (2.5 MG/3ML) 0.083% nebulizer solution 2.5 mg, 2.5 mg, Nebulization, Q6H PRN, Katrinka Blazing, Rondell A, MD   amLODipine (NORVASC) tablet 10 mg, 10 mg, Oral, Daily, Katrinka Blazing, Rondell A, MD, 10 mg at 08/10/23 9147   diclofenac Sodium (VOLTAREN) 1 % topical gel 2 g, 2 g, Topical, QID PRN, Katrinka Blazing, Rondell A, MD   enoxaparin (LOVENOX) injection 40 mg, 40 mg, Subcutaneous, Daily, Katrinka Blazing, Rondell A, MD, 40 mg at 08/09/23 1010   isosorbide mononitrate (IMDUR) 24 hr tablet 60 mg, 60 mg, Oral, Daily, Katrinka Blazing, Rondell A, MD, 60 mg at 08/10/23 0933   losartan (COZAAR) tablet 50 mg, 50 mg, Oral, BID, Katrinka Blazing, Rondell A, MD, 50 mg at 08/10/23 8295   magnesium oxide (MAG-OX) tablet 400 mg, 400 mg, Oral, q morning, Katrinka Blazing, Rondell A, MD, 400 mg at 08/10/23 6213   metoprolol succinate (TOPROL-XL) 24 hr tablet 12.5 mg, 12.5 mg, Oral, Daily, Smith, Rondell A, MD   olopatadine (PATANOL) 0.1 % ophthalmic solution 1 drop, 1 drop, Both Eyes, BID, Smith, Rondell A, MD, 1 drop at 08/10/23 0946   ondansetron (ZOFRAN) tablet 4 mg, 4 mg, Oral, Q6H PRN  **OR** ondansetron (ZOFRAN) injection 4 mg, 4 mg, Intravenous, Q6H PRN, Clydie Braun, MD   Oral care mouth rinse, 15 mL, Mouth Rinse, PRN, Katrinka Blazing, Rondell A, MD   pantoprazole (PROTONIX) injection 40 mg, 40 mg, Intravenous, Q12H, Smith, Rondell A, MD, 40 mg at 08/10/23 0933   polyvinyl alcohol (LIQUIFILM TEARS) 1.4 % ophthalmic solution 1 drop, 1 drop, Both Eyes, BID, Smith, Rondell A, MD, 1 drop at 08/10/23 0942   pramipexole (MIRAPEX) tablet 0.25 mg, 0.25 mg, Oral, BID, Smith, Rondell A, MD, 0.25 mg at 08/10/23 0933   sodium chloride flush (NS) 0.9 % injection 3 mL, 3 mL, Intravenous, Q12H, Smith, Rondell A, MD, 3 mL at 08/10/23 0941   sodium chloride 75 mL/hr at 08/10/23 0242     Vital signs in last 24 hrs: Vitals:   08/10/23 0752 08/10/23 0933  BP: (!) 167/62 (!) 167/62  Pulse: (!) 52   Resp: 18   Temp: 98.2 F (36.8 C)   SpO2: 97%     Intake/Output Summary (Last 24 hours) at 08/10/2023 1159 Last data filed at 08/10/2023 0941 Gross per 24 hour  Intake 1333.59 ml  Output --  Net 1333.59 ml     Physical Exam Her son is in the room for my entire visit She is sitting comfortably on a couch by the window  HEENT: sclera anicteric, oral mucosa without lesions Cardiac: RRR without murmurs, S1S2 heard, no peripheral edema Pulm: clear to auscultation bilaterally,  normal RR and effort noted Abdomen: soft, no tenderness, with active bowel sounds. No guarding or palpable hepatosplenomegaly Skin; warm and dry, no jaundice  Recent Labs:     Latest Ref Rng & Units 08/10/2023    5:39 AM 08/09/2023    4:58 PM 08/08/2023    6:25 PM  CBC  WBC 4.0 - 10.5 K/uL 4.8  4.6  6.2   Hemoglobin 12.0 - 15.0 g/dL 96.0  45.4  09.8   Hematocrit 36.0 - 46.0 % 38.3  40.6  41.0   Platelets 150 - 400 K/uL 134  147  176     Recent Labs  Lab 08/10/23 0539  INR 1.1      Latest Ref Rng & Units 08/10/2023    5:39 AM 08/09/2023    4:58 PM 08/09/2023    9:09 AM  CMP  Glucose 70 - 99 mg/dL 81   86    BUN 8 - 23 mg/dL 8   11   Creatinine 1.19 - 1.00 mg/dL 1.47   8.29   Sodium 562 - 145 mmol/L 141   141   Potassium 3.5 - 5.1 mmol/L 3.6   3.8   Chloride 98 - 111 mmol/L 108   105   CO2 22 - 32 mmol/L 24   25   Calcium 8.9 - 10.3 mg/dL 9.1   9.2   Total Protein 6.5 - 8.1 g/dL 5.8  6.2  6.1   Total Bilirubin 0.0 - 1.2 mg/dL 0.8  0.8  0.5   Alkaline Phos 38 - 126 U/L 130  151  150   AST 15 - 41 U/L 58  102  102   ALT 0 - 44 U/L 92  130  127      Radiologic studies: MRI abdomen/MRCP with aortic atherosclerosis, hiatal hernia, some motion degradation and no reported choledocholithiasis  Assessment & Plan  Assessment: Colicky epigastric pain, lately worsening  Elevated LFTs, somewhat better today.  Single episode coffee-ground's emesis day of admission, some black stool overnight.  Raises concern for upper GI bleeding Suspect she had a gastric or esophageal mucosal tear from vomiting, which sounds more likely than gastric ulcer or other chronic mucosal cause causing this colicky pain she has been recently experiencing.  Ulcer or hiatal hernia also would not explain what her LFTs were elevated.  Hiatal hernia, known from prior upper endoscopy with Dr. Adela Lank.  Based on the radiologic description of the fundus having herniated, sounds paraesophageal.    While radiology did not note any choledocholithiasis, I still have some clinical suspicion this could be the case given the overall clinical picture and known limitations of MRCP when there is motion degradation.  Possible she had a stone that passed. I will run the case by our biliary endoscopist regarding the need for ERCP.  Unfortunately, we do not currently have an EUS capable endoscopist available to further visualize the CBD.  I think she had some self-limited GI bleeding from the probable cause as noted above, EGD likely warranted this admission.  She does not appear to be having gross GI bleeding I will let her eat a light  diet today, then n.p.o. after midnight in case there is further evidence of bleeding that might warrant upper endoscopy tomorrow.  CBC and CMP tomorrow  They were in agreement with the plan, all questions were answered   Charlie Pitter III Office: [phone]

## 2023-08-10 NOTE — Progress Notes (Signed)
PROGRESS NOTE    Heather [name]  MVH:[id number] DOB: 12/04/1934 DOA: 08/08/2023 PCP: Bradd Canary, MD   Brief Narrative: Heather [name] is a 89 y.o. female with a history of hypertension, hyperlipidemia, chronic heart failure preserved EF, CAD, diabetes mellitus type 2.  Patient presented secondary to abdominal pain with associated hematemesis.  Initial CT imaging without evidence to explain patient's symptoms.  Initial labs were concerning for possible choledocholithiasis.  GI was consulted.  MRCP obtained which was significant for no evidence of choledocholithiasis or biliary duct dilation.  During hospitalization, patient developed black stools concerning for melena.   Assessment and Plan:  Epigastric abdominal pain Unclear etiology. History of cholecystectomy. Initial CT angiogram of abdomen/pelvis significant for no etiology to explain patient's abdominal pain. Associated elevated alkaline phosphatase, AST, ALT concerning for possible choledocholithiasis. GI consulted. MRCP ordered and significant for no evidence of choledocholithiasis or biliary ductal dilation. Abdominal pain and alkaline phosphatase/AST/ALT improved. -GI recommendations: pending today  Hematemesis Noted and present on admission. Coffee-ground in description. Hemoglobin stable. GI consulted and with initial plan for no upper endoscopy evaluation. Patient has now reported development of black stools, concerning for possible melena -GI recommendations -Continue Protonix IV  Black stools Concerning for melena. In setting of reports of hematemesis. Hemoglobin stable. -FOBT  Primary hypertension Patient is on Imdur, amlodipine, losartan, metoprolol succinate as an outpatient. -Continue Imdur, losartan, metoprolol succinate, amlodipine  Diabetes mellitus type 2 Controlled with last hemoglobin A1c of 6.8%.  Currently diet controlled.  Hyperlipidemia Patient is on simvastatin as outpatient which was held  secondary to elevated LFTs.  GERD Hiatal hernia Prior history.  Redemonstrated on imaging this admission.  Patient on Prilosec as an outpatient and started on Protonix this admission. -Continue Protonix  Chronic heart failure with preserved EF Stable. -Continue metoprolol succinate, losartan   DVT prophylaxis: Lovenox Code Status:   Code Status: Full Code Family Communication: Son at bedside Disposition Plan: Discharge home pending continued GI recommendations, ability to advance diet   Consultants:  [facility]  Procedures:  None  Antimicrobials: None   Subjective: Patient reports significantly improved abdominal pain.  No nausea or vomiting.  Patient reports 2 episodes of black stools.  No recurrent hematemesis.  Objective: BP (!) 167/62   Pulse (!) 52   Temp 98.2 F (36.8 C)   Resp 18   Ht 5\' 2"  (1.575 m)   Wt 61.6 kg   LMP  (LMP Unknown)   SpO2 97%   BMI 24.84 kg/m   Examination:  General exam: Appears calm and comfortable. Respiratory system: Clear to auscultation. Respiratory effort normal. Cardiovascular system: S1 & S2 heard, RRR. No murmurs, rubs, gallops or clicks. Gastrointestinal system: Abdomen is nondistended, soft and nontender. No organomegaly or masses felt. Normal bowel sounds heard. Central nervous system: Alert and oriented. No focal neurological deficits. Psychiatry: Judgement and insight appear normal. Mood & affect appropriate.    Data Reviewed: I have personally reviewed following labs and imaging studies  CBC Lab Results  Component Value Date   WBC 4.8 08/10/2023   RBC 4.23 08/10/2023   HGB 12.7 08/10/2023   HCT 38.3 08/10/2023   MCV 90.5 08/10/2023   MCH 30.0 08/10/2023   PLT 134 (L) 08/10/2023   MCHC 33.2 08/10/2023   RDW 13.2 08/10/2023   LYMPHSABS 1.4 08/08/2023   MONOABS 0.6 08/08/2023   EOSABS 0.2 08/08/2023   BASOSABS 0.0 08/08/2023     Last metabolic panel Lab Results  Component Value Date  NA  141 08/10/2023   K 3.6 08/10/2023   CL 108 08/10/2023   CO2 24 08/10/2023   BUN 8 08/10/2023   CREATININE 0.75 08/10/2023   GLUCOSE 81 08/10/2023   GFRNONAA >60 08/10/2023   GFRAA 57 (L) 08/19/2020   CALCIUM 9.1 08/10/2023   PHOS 3.5 02/01/2021   PROT 5.8 (L) 08/10/2023   ALBUMIN 3.4 (L) 08/10/2023   BILITOT 0.8 08/10/2023   ALKPHOS 130 (H) 08/10/2023   AST 58 (H) 08/10/2023   ALT 92 (H) 08/10/2023   ANIONGAP 9 08/10/2023    GFR: Estimated Creatinine Clearance: 43.6 mL/min (by C-G formula based on SCr of 0.75 mg/dL).  No results found for this or any previous visit (from the past 240 hours).    Radiology Studies: MR ABDOMEN MRCP W WO CONTAST Result Date: 08/09/2023 CLINICAL DATA:  Elevated LFTs, abdominal pain EXAM: MRI ABDOMEN WITHOUT AND WITH CONTRAST (INCLUDING MRCP) TECHNIQUE: Multiplanar multisequence MR imaging of the abdomen was performed both before and after the administration of intravenous contrast. Heavily T2-weighted images of the biliary and pancreatic ducts were obtained, and three-dimensional MRCP images were rendered by post processing. CONTRAST:  7.59mL GADAVIST GADOBUTROL 1 MMOL/ML IV SOLN COMPARISON:  CT angiogram abdomen pelvis, 08/09/2023 FINDINGS: Examination is generally limited by breath motion artifact throughout. Lower chest: No acute abnormality. Moderate hiatal hernia with intrathoracic position of the gastric fundus. Hepatobiliary: No focal liver abnormality is seen. Status post cholecystectomy. Unchanged postoperative biliary ductal dilatation. No calculus or other obstruction identified to the ampulla (series 3, image 18). Pancreas: Unremarkable. No pancreatic ductal dilatation or surrounding inflammatory changes. Spleen: Normal in size without significant abnormality. Adrenals/Urinary Tract: Adrenal glands are unremarkable. Simple, benign right renal cortical cysts, for which no further follow-up or characterization is required. Kidneys are otherwise  normal, without renal calculi, solid lesion, or hydronephrosis. Stomach/Bowel: Stomach is within normal limits. No evidence of bowel wall thickening, distention, or inflammatory changes. Vascular/Lymphatic: Severe aortic atherosclerosis. No enlarged abdominal lymph nodes. Other: No abdominal wall hernia or abnormality. No ascites. Musculoskeletal: No acute or significant osseous findings. IMPRESSION: 1. Examination is generally limited by breath motion artifact throughout. 2. Status post cholecystectomy. Unchanged postoperative biliary ductal dilatation. No evidence of choledocholithiasis or other obstruction. 3. Moderate hiatal hernia with intrathoracic position of the gastric fundus. 4. Severe aortic atherosclerosis. Aortic Atherosclerosis (ICD10-I70.0). Electronically Signed   By: Jearld Lesch M.D.   On: 08/09/2023 16:20   US Abdomen Limited Result Date: 08/09/2023 CLINICAL DATA:  Transaminitis EXAM: ULTRASOUND ABDOMEN LIMITED RIGHT UPPER QUADRANT COMPARISON:  Ultrasound 05/27/2017.  CTA earlier 08/09/2023. FINDINGS: Gallbladder: Previous cholecystectomy Common bile duct: Diameter: 7 mm. Minimal central intrahepatic biliary duct ectasia as seen on CT Liver: No focal lesion identified. Within normal limits in parenchymal echogenicity. Portal vein is patent on color Doppler imaging with normal direction of blood flow towards the liver. Other: None. IMPRESSION: Previous cholecystectomy. Minimal central intrahepatic biliary duct dilatation as seen on CT. Electronically Signed   By: Karen Kays M.D.   On: 08/09/2023 09:57   CT Angio Abd/Pel W and/or Wo Contrast Result Date: 08/09/2023 CLINICAL DATA:  Abdominal pain EXAM: CTA ABDOMEN AND PELVIS WITHOUT AND WITH CONTRAST TECHNIQUE: Multidetector CT imaging of the abdomen and pelvis was performed using the standard protocol during bolus administration of intravenous contrast. Multiplanar reconstructed images and MIPs were obtained and reviewed to evaluate the  vascular anatomy. RADIATION DOSE REDUCTION: This exam was performed according to the departmental dose-optimization program which includes automated exposure control,  adjustment of the mA and/or kV according to patient size and/or use of iterative reconstruction technique. CONTRAST:  75mL OMNIPAQUE IOHEXOL 350 MG/ML SOLN COMPARISON:  08/07/2023 FINDINGS: VASCULAR Aorta: Atherosclerotic calcifications are noted. No aneurysmal dilatation or dissection is seen. Celiac: Plaque is noted at the origin with mild stenosis. No significant poststenotic dilatation is seen. SMA: Patent without evidence of aneurysm, dissection, vasculitis or significant stenosis. Renals: Both renal arteries are patent without evidence of aneurysm, dissection, vasculitis, fibromuscular dysplasia or significant stenosis. IMA: Patent without evidence of aneurysm, dissection, vasculitis or significant stenosis. Inflow: Iliacs demonstrate atherosclerotic calcification without focal abnormality. Veins: No specific venous abnormality is noted. Review of the MIP images confirms the above findings. NON-VASCULAR Lower chest: No acute abnormality. Hepatobiliary: No focal liver abnormality is seen. Status post cholecystectomy. No biliary dilatation. Pancreas: Unremarkable. No pancreatic ductal dilatation or surrounding inflammatory changes. Spleen: Normal in size without focal abnormality. Adrenals/Urinary Tract: Adrenal glands are within normal limits. Kidneys are well visualize within normal enhancement pattern. Simple renal cysts are noted on the right. No further follow-up is recommended. The ureters are within normal limits. The bladder is decompressed. Air is seen within the bladder stable from the prior exam. Stomach/Bowel: Mild diverticular change of the colon is noted. No diverticulitis is seen. The appendix is within normal limits. Stomach shows a large hiatal hernia. Small bowel is within normal limits. Lymphatic: Aortic atherosclerosis. No  enlarged abdominal or pelvic lymph nodes. Reproductive: Status post hysterectomy. No adnexal masses. Other: Postsurgical changes are noted in the anterior abdominal wall consistent with prior ventral hernia repair. No recurrent hernia is seen. The previously seen fluid collection in the operative bed is not well visualized on today's exam. Musculoskeletal: No acute bony abnormality noted. IMPRESSION: VASCULAR Scattered atherosclerotic calcifications are noted without complicating factors. No acute findings to suggest mesenteric ischemia are noted. NON-VASCULAR Diverticular change without diverticulitis. No acute abnormality is noted. Electronically Signed   By: Alcide Clever M.D.   On: 08/09/2023 02:11   DG Chest 2 View Result Date: 08/08/2023 CLINICAL DATA:  Chest pain EXAM: CHEST - 2 VIEW COMPARISON:  02/13/2023 FINDINGS: The heart size and mediastinal contours are within normal limits. Aortic atherosclerosis. No focal airspace consolidation, pleural effusion, or pneumothorax. The visualized skeletal structures are unremarkable. IMPRESSION: No active cardiopulmonary disease. Electronically Signed   By: Duanne Guess D.O.   On: 08/08/2023 19:44      LOS: 1 day    Jacquelin Hawking, MD [facility] 08/10/2023, 11:45 AM   If 7PM-7AM, please contact night-coverage [email or web address]

## 2023-08-10 NOTE — Hospital Course (Signed)
Heather [name] is a 89 y.o. female with a history of hypertension, hyperlipidemia, chronic heart failure preserved EF, CAD, diabetes mellitus type 2.  Patient presented secondary to abdominal pain with associated hematemesis.  Initial CT imaging without evidence to explain patient's symptoms.  Initial labs were concerning for possible choledocholithiasis.  GI was consulted.  MRCP obtained which was significant for no evidence of choledocholithiasis or biliary duct dilation.  During hospitalization, patient developed black stools concerning for melena; FOBT negative. Upper endoscopy performed on 2/3 and was significant for no etiology/stigmata for/of recent bleeding.

## 2023-08-11 DIAGNOSIS — I10 Essential (primary) hypertension: Secondary | ICD-10-CM | POA: Diagnosis not present

## 2023-08-11 DIAGNOSIS — R7989 Other specified abnormal findings of blood chemistry: Secondary | ICD-10-CM | POA: Diagnosis not present

## 2023-08-11 DIAGNOSIS — R1013 Epigastric pain: Secondary | ICD-10-CM | POA: Diagnosis not present

## 2023-08-11 DIAGNOSIS — K920 Hematemesis: Secondary | ICD-10-CM | POA: Diagnosis not present

## 2023-08-11 DIAGNOSIS — R7401 Elevation of levels of liver transaminase levels: Secondary | ICD-10-CM | POA: Diagnosis not present

## 2023-08-11 LAB — COMPREHENSIVE METABOLIC PANEL
ALT: 65 U/L — ABNORMAL HIGH (ref 0–44)
AST: 31 U/L (ref 15–41)
Albumin: 3.4 g/dL — ABNORMAL LOW (ref 3.5–5.0)
Alkaline Phosphatase: 117 U/L (ref 38–126)
Anion gap: 9 (ref 5–15)
BUN: 12 mg/dL (ref 8–23)
CO2: 22 mmol/L (ref 22–32)
Calcium: 9 mg/dL (ref 8.9–10.3)
Chloride: 109 mmol/L (ref 98–111)
Creatinine, Ser: 0.86 mg/dL (ref 0.44–1.00)
GFR, Estimated: >60 mL/min (ref >60)
Glucose, Bld: 95 mg/dL (ref 70–99)
Potassium: 3.6 mmol/L (ref 3.5–5.1)
Sodium: 140 mmol/L (ref 135–145)
Total Bilirubin: 0.5 mg/dL (ref 0.0–1.2)
Total Protein: 5.6 g/dL — ABNORMAL LOW (ref 6.5–8.1)

## 2023-08-11 LAB — CBC
HCT: 39.1 % (ref 36.0–46.0)
Hemoglobin: 12.7 g/dL (ref 12.0–15.0)
MCH: 29.7 pg (ref 26.0–34.0)
MCHC: 32.5 g/dL (ref 30.0–36.0)
MCV: 91.4 fL (ref 80.0–100.0)
Platelets: 146 10*3/uL — ABNORMAL LOW (ref 150–400)
RBC: 4.28 MIL/uL (ref 3.87–5.11)
RDW: 13.1 % (ref 11.5–15.5)
WBC: 4.5 10*3/uL (ref 4.0–10.5)
nRBC: 0 % (ref 0.0–0.2)

## 2023-08-11 MED ORDER — SODIUM CHLORIDE 0.9 % IV SOLN
1.0000 g | INTRAVENOUS | Status: DC
  Administered 2023-08-11: 1 g via INTRAVENOUS
  Filled 2023-08-11: qty 10

## 2023-08-11 NOTE — Progress Notes (Addendum)
[facility] Progress Note  CC:  Epigastric pain and elevated LFTs   Subjective:  Ate a little bit without increased pain but still some soreness in upper abdomen.  Son is at bedside.    No further black stool since night before last, no hematemesis since admission   No chest pain dyspnea or dysuria Objective:  Vital signs in last 24 hours: Temp:  [97.2 F (36.2 C)-98.1 F (36.7 C)] 98.1 F (36.7 C) (02/02 0757) Pulse Rate:  [49-58] 56 (02/02 0757) Resp:  [16-18] 18 (02/02 0757) BP: (112-167)/(52-64) 163/63 (02/02 0757) SpO2:  [93 %-97 %] 97 % (02/02 0757) Last BM Date : 08/09/23 General:  Alert, Well-developed, in NAD Heart: Slightly bradycardic but regular rhythm; no murmurs Pulm:   CTAB.  No W/R/R. Abdomen:  Soft, non-distended.  BS present.  Some upper abdominal TTP. Extremities:  Without edema.  Bandage on right leg where there was a recent skin cancer removal. Neurologic:  Alert and oriented x 4;  grossly normal neurologically. Psych:  Alert and cooperative. Normal mood and affect.  Intake/Output from previous day: 02/01 0701 - 02/02 0700 In: 2274.4 [P.O.:720; I.V.:1554.4] Out: -   Lab Results: Recent Labs    08/09/23 1658 08/10/23 0539 08/11/23 0700  WBC 4.6 4.8 4.5  HGB 13.4 12.7 12.7  HCT 40.6 38.3 39.1  PLT 147* 134* 146*   BMET Recent Labs    08/09/23 0909 08/10/23 0539 08/11/23 0700  NA 141 141 140  K 3.8 3.6 3.6  CL 105 108 109  CO2 25 24 22   GLUCOSE 86 81 95  BUN 11 8 12   CREATININE 0.80 0.75 0.86  CALCIUM 9.2 9.1 9.0   LFT Recent Labs    08/10/23 0539 08/11/23 0700  PROT 5.8* 5.6*  ALBUMIN 3.4* 3.4*  AST 58* 31  ALT 92* 65*  ALKPHOS 130* 117  BILITOT 0.8 0.5  BILIDIR <0.1  --   IBILI NOT CALCULATED  --    PT/INR Recent Labs    08/10/23 0539  LABPROT 14.4  INR 1.1   Hepatitis Panel Recent Labs    08/09/23 0909  HEPBSAG NON REACTIVE  HCVAB NON REACTIVE  HEPAIGM NON REACTIVE  HEPBIGM NON REACTIVE     MR ABDOMEN MRCP W WO CONTAST Result Date: 08/09/2023 CLINICAL DATA:  Elevated LFTs, abdominal pain EXAM: MRI ABDOMEN WITHOUT AND WITH CONTRAST (INCLUDING MRCP) TECHNIQUE: Multiplanar multisequence MR imaging of the abdomen was performed both before and after the administration of intravenous contrast. Heavily T2-weighted images of the biliary and pancreatic ducts were obtained, and three-dimensional MRCP images were rendered by post processing. CONTRAST:  7.46mL GADAVIST GADOBUTROL 1 MMOL/ML IV SOLN COMPARISON:  CT angiogram abdomen pelvis, 08/09/2023 FINDINGS: Examination is generally limited by breath motion artifact throughout. Lower chest: No acute abnormality. Moderate hiatal hernia with intrathoracic position of the gastric fundus. Hepatobiliary: No focal liver abnormality is seen. Status post cholecystectomy. Unchanged postoperative biliary ductal dilatation. No calculus or other obstruction identified to the ampulla (series 3, image 18). Pancreas: Unremarkable. No pancreatic ductal dilatation or surrounding inflammatory changes. Spleen: Normal in size without significant abnormality. Adrenals/Urinary Tract: Adrenal glands are unremarkable. Simple, benign right renal cortical cysts, for which no further follow-up or characterization is required. Kidneys are otherwise normal, without renal calculi, solid lesion, or hydronephrosis. Stomach/Bowel: Stomach is within normal limits. No evidence of bowel wall thickening, distention, or inflammatory changes. Vascular/Lymphatic: Severe aortic atherosclerosis. No enlarged abdominal lymph nodes. Other: No abdominal wall hernia or  abnormality. No ascites. Musculoskeletal: No acute or significant osseous findings. IMPRESSION: 1. Examination is generally limited by breath motion artifact throughout. 2. Status post cholecystectomy. Unchanged postoperative biliary ductal dilatation. No evidence of choledocholithiasis or other obstruction. 3. Moderate hiatal hernia with  intrathoracic position of the gastric fundus. 4. Severe aortic atherosclerosis. Aortic Atherosclerosis (ICD10-I70.0). Electronically Signed   By: Jearld Lesch M.D.   On: 08/09/2023 16:20   US Abdomen Limited Result Date: 08/09/2023 CLINICAL DATA:  Transaminitis EXAM: ULTRASOUND ABDOMEN LIMITED RIGHT UPPER QUADRANT COMPARISON:  Ultrasound 05/27/2017.  CTA earlier 08/09/2023. FINDINGS: Gallbladder: Previous cholecystectomy Common bile duct: Diameter: 7 mm. Minimal central intrahepatic biliary duct ectasia as seen on CT Liver: No focal lesion identified. Within normal limits in parenchymal echogenicity. Portal vein is patent on color Doppler imaging with normal direction of blood flow towards the liver. Other: None. IMPRESSION: Previous cholecystectomy. Minimal central intrahepatic biliary duct dilatation as seen on CT. Electronically Signed   By: Karen Kays M.D.   On: 08/09/2023 09:57   Assessment / Plan: Colicky epigastric pain, lately worsening   Elevated LFTs, better and close to normal today.   Single episode coffee-ground's emesis day of admission, some black stool here.  Raises concern for upper GI bleeding.  Hgb is normal and stable. Suspect she had a gastric or esophageal mucosal tear from vomiting, which sounds more likely than gastric ulcer or other chronic mucosal cause causing this colicky pain she has been recently experiencing.  Ulcer or hiatal hernia also would not explain what her LFTs were elevated.   Hiatal hernia, known from prior upper endoscopy with Dr. Adela Lank.  Based on the radiologic description of the fundus having herniated, sounds paraesophageal.  ? If this causing some of her pain but would not explain the elevated LFTs.   While radiology did not note any choledocholithiasis, we still have some clinical suspicion this could be the case given the overall clinical picture and known limitations of MRCP when there is motion degradation.  Possible she had a stone that  passed.  Unfortunately, we do not currently have an EUS capable endoscopist available to further visualize the CBD.   I think she had some self-limited GI bleeding from the probable cause as noted above, EGD likely warranted this admission and we will plan for that on 2/3.  Orders in.  She can eat today then NPO after midnight.  Will check CBC and CMP again in AM.  She does have concern and questions regarding what to do if the pain/symptoms recur as an outpatient.  I advised that she probably should contact our office for STAT LFTs to see if we can catch another elevation of those during an episode.    LOS: 2 days   Heather [name]. Zehr  08/11/2023, 9:21 AM  I have taken an interval history, thoroughly reviewed the chart and examined the patient. I agree with the Advanced Practitioner's note, impression and recommendations, and have recorded additional findings, impressions and recommendations below. I performed a substantive portion of this encounter (>50% time spent), including a complete performance of the medical decision making.  My additional thoughts are as follows:  The pain has not recurred since admission, and the LFTs continue to trend toward normal.  This along with the MRCP report suggest that there is not convincing evidence of choledocholithiasis, and my biliary endoscopist partner agreed we should not proceed with ERCP at this juncture. That said, I still have concerns that she could have a nonobstructing stone or  stones "ball valving" in the CBD.  EUS would be the next best test to evaluate this, but as noted above, we do not currently have an EUS capable endoscopist available to do this either inpatient or outpatient for at least the next few weeks.  Local academic institutions are not accepting inpatient transfers at this time. Her probable upper GI bleeding with reported coffee-ground material and black stool still fortunately is not brisk bleeding and seems to have resolved and  hemoglobin remains normal.  I had a long discussion with her and her daughter, and they understandably have questions and concerns because of the uncertainties of the current situation.  They were agreeable to the upper endoscopy tomorrow with Dr. Doy Hutching to evaluate a possible source of upper GI blood loss.  If LFTs normalize, then she may be able to be discharged very soon.  There is understandable concern from the patient and her family that if there is a small retained stone in the duct, another episode like this could occur, and I also conveyed that each episode also carries the risk of cholangitis. So I will recommend to my partners that consideration be given to contacting EUS capable endoscopist at academic institutions to see about the possibility of an expedited outpatient EUS (with possible ERCP at the same time if stone is discovered) for this patient.  I think that is the best plan we can formulate at this time with the current clinical scenario and the resources available.   40 minutes were spent on this encounter (including chart review, history/exam, counseling/coordination of care, and documentation) > 50% of that time was spent on counseling and coordination of care.  Charlie Pitter III Office:[phone]

## 2023-08-11 NOTE — Plan of Care (Signed)
  Problem: Education: Goal: Knowledge of General Education information will improve Description: Including pain rating scale, medication(s)/side effects and non-pharmacologic comfort measures Outcome: Progressing   Problem: Health Behavior/Discharge Planning: Goal: Ability to manage health-related needs will improve Outcome: Progressing   Problem: Clinical Measurements: Goal: Ability to maintain clinical measurements within normal limits will improve Outcome: Progressing   Problem: Activity: Goal: Risk for activity intolerance will decrease Outcome: Progressing   Problem: Pain Managment: Goal: General experience of comfort will improve and/or be controlled Outcome: Progressing   Problem: Safety: Goal: Ability to remain free from injury will improve Outcome: Progressing   Problem: Skin Integrity: Goal: Risk for impaired skin integrity will decrease Outcome: Progressing

## 2023-08-11 NOTE — Progress Notes (Signed)
PROGRESS NOTE    Heather [name]  WUJ:[id number] DOB: October 13, 1934 DOA: 08/08/2023 PCP: Bradd Canary, MD   Brief Narrative: Heather [name] is a 89 y.o. female with a history of hypertension, hyperlipidemia, chronic heart failure preserved EF, CAD, diabetes mellitus type 2.  Patient presented secondary to abdominal pain with associated hematemesis.  Initial CT imaging without evidence to explain patient's symptoms.  Initial labs were concerning for possible choledocholithiasis.  GI was consulted.  MRCP obtained which was significant for no evidence of choledocholithiasis or biliary duct dilation.  During hospitalization, patient developed black stools concerning for melena; FOBT negative. Plan for upper endoscopy.   Assessment and Plan:  Epigastric abdominal pain Unclear etiology. History of cholecystectomy. Initial CT angiogram of abdomen/pelvis significant for no etiology to explain patient's abdominal pain. Associated elevated alkaline phosphatase, AST, ALT concerning for possible choledocholithiasis. GI consulted. MRCP ordered and significant for no evidence of choledocholithiasis or biliary ductal dilation. Abdominal pain and alkaline phosphatase/AST/ALT improved. GI reports some clinical suspicion for for choledocholithiasis, although are without EUS capabilities.  Hematemesis Noted and present on admission. Coffee-ground in description. Hemoglobin stable. GI consulted and with initial plan for no upper endoscopy evaluation. Patient has now reported development of black stools, concerning for possible melena -GI recommendations: plan for upper endoscopy on 2/3 -Continue Protonix IV  Black stools Concerning for melena. In setting of reports of hematemesis. Hemoglobin stable. FOBT negative.  Primary hypertension Patient is on Imdur, amlodipine, losartan, metoprolol succinate as an outpatient. Patient with persistent bradycardia without metoprolol -Continue Imdur, losartan,  amlodipine -Discontinue metoprolol  Diabetes mellitus type 2 Controlled with last hemoglobin A1c of 6.8%.  Currently diet controlled.  Hyperlipidemia Patient is on simvastatin as outpatient which was held secondary to elevated LFTs.  GERD Hiatal hernia Prior history.  Redemonstrated on imaging this admission.  Patient on Prilosec as an outpatient and started on Protonix this admission. -Continue Protonix  Chronic heart failure with preserved EF Stable. -Continue metoprolol succinate, losartan   DVT prophylaxis: Lovenox Code Status:   Code Status: Full Code Family Communication: Son at bedside Disposition Plan: Discharge home pending continued GI recommendations, ability to advance diet   Consultants:  [facility]  Procedures:  None  Antimicrobials: None   Subjective: No abdominal pain, nausea or vomiting.  Objective: BP (!) 163/63   Pulse (!) 56   Temp 98.1 F (36.7 C)   Resp 18   Ht 5\' 2"  (1.575 m)   Wt 61.6 kg   LMP  (LMP Unknown)   SpO2 97%   BMI 24.84 kg/m   Examination:  General exam: Appears calm and comfortable Respiratory system: Clear to auscultation. Respiratory effort normal. Cardiovascular system: S1 & S2 heard, RRR. Gastrointestinal system: Abdomen is nondistended, soft and nontender. Normal bowel sounds heard. Central nervous system: Alert and oriented. No focal neurological deficits. Musculoskeletal: No edema. No calf tenderness Psychiatry: Judgement and insight appear normal. Mood & affect appropriate.    Data Reviewed: I have personally reviewed following labs and imaging studies  CBC Lab Results  Component Value Date   WBC 4.5 08/11/2023   RBC 4.28 08/11/2023   HGB 12.7 08/11/2023   HCT 39.1 08/11/2023   MCV 91.4 08/11/2023   MCH 29.7 08/11/2023   PLT 146 (L) 08/11/2023   MCHC 32.5 08/11/2023   RDW 13.1 08/11/2023   LYMPHSABS 1.4 08/08/2023   MONOABS 0.6 08/08/2023   EOSABS 0.2 08/08/2023   BASOSABS 0.0  08/08/2023     Last metabolic  panel Lab Results  Component Value Date   NA 140 08/11/2023   K 3.6 08/11/2023   CL 109 08/11/2023   CO2 22 08/11/2023   BUN 12 08/11/2023   CREATININE 0.86 08/11/2023   GLUCOSE 95 08/11/2023   GFRNONAA >60 08/11/2023   GFRAA 57 (L) 08/19/2020   CALCIUM 9.0 08/11/2023   PHOS 3.5 02/01/2021   PROT 5.6 (L) 08/11/2023   ALBUMIN 3.4 (L) 08/11/2023   BILITOT 0.5 08/11/2023   ALKPHOS 117 08/11/2023   AST 31 08/11/2023   ALT 65 (H) 08/11/2023   ANIONGAP 9 08/11/2023    GFR: Estimated Creatinine Clearance: 40.5 mL/min (by C-G formula based on SCr of 0.86 mg/dL).  No results found for this or any previous visit (from the past 240 hours).    Radiology Studies: MR ABDOMEN MRCP W WO CONTAST Result Date: 08/09/2023 CLINICAL DATA:  Elevated LFTs, abdominal pain EXAM: MRI ABDOMEN WITHOUT AND WITH CONTRAST (INCLUDING MRCP) TECHNIQUE: Multiplanar multisequence MR imaging of the abdomen was performed both before and after the administration of intravenous contrast. Heavily T2-weighted images of the biliary and pancreatic ducts were obtained, and three-dimensional MRCP images were rendered by post processing. CONTRAST:  7.45mL GADAVIST GADOBUTROL 1 MMOL/ML IV SOLN COMPARISON:  CT angiogram abdomen pelvis, 08/09/2023 FINDINGS: Examination is generally limited by breath motion artifact throughout. Lower chest: No acute abnormality. Moderate hiatal hernia with intrathoracic position of the gastric fundus. Hepatobiliary: No focal liver abnormality is seen. Status post cholecystectomy. Unchanged postoperative biliary ductal dilatation. No calculus or other obstruction identified to the ampulla (series 3, image 18). Pancreas: Unremarkable. No pancreatic ductal dilatation or surrounding inflammatory changes. Spleen: Normal in size without significant abnormality. Adrenals/Urinary Tract: Adrenal glands are unremarkable. Simple, benign right renal cortical cysts, for which no  further follow-up or characterization is required. Kidneys are otherwise normal, without renal calculi, solid lesion, or hydronephrosis. Stomach/Bowel: Stomach is within normal limits. No evidence of bowel wall thickening, distention, or inflammatory changes. Vascular/Lymphatic: Severe aortic atherosclerosis. No enlarged abdominal lymph nodes. Other: No abdominal wall hernia or abnormality. No ascites. Musculoskeletal: No acute or significant osseous findings. IMPRESSION: 1. Examination is generally limited by breath motion artifact throughout. 2. Status post cholecystectomy. Unchanged postoperative biliary ductal dilatation. No evidence of choledocholithiasis or other obstruction. 3. Moderate hiatal hernia with intrathoracic position of the gastric fundus. 4. Severe aortic atherosclerosis. Aortic Atherosclerosis (ICD10-I70.0). Electronically Signed   By: Jearld Lesch M.D.   On: 08/09/2023 16:20      LOS: 2 days    Jacquelin Hawking, MD [facility] 08/11/2023, 10:45 AM   If 7PM-7AM, please contact night-coverage [email or web address]

## 2023-08-11 NOTE — Plan of Care (Signed)

## 2023-08-12 ENCOUNTER — Inpatient Hospital Stay (HOSPITAL_COMMUNITY): Payer: Medicare HMO | Admitting: Anesthesiology

## 2023-08-12 ENCOUNTER — Encounter (HOSPITAL_COMMUNITY): Payer: Self-pay | Admitting: Internal Medicine

## 2023-08-12 ENCOUNTER — Telehealth: Payer: Self-pay | Admitting: Pediatrics

## 2023-08-12 ENCOUNTER — Encounter (HOSPITAL_COMMUNITY)
Admission: EM | Disposition: A | Discharge status: Home / Self Care | Payer: Self-pay | Source: Home / Self Care | Attending: Family Medicine

## 2023-08-12 ENCOUNTER — Other Ambulatory Visit (HOSPITAL_COMMUNITY): Payer: Self-pay

## 2023-08-12 ENCOUNTER — Other Ambulatory Visit: Payer: Self-pay | Admitting: Pediatrics

## 2023-08-12 DIAGNOSIS — K2970 Gastritis, unspecified, without bleeding: Secondary | ICD-10-CM

## 2023-08-12 DIAGNOSIS — I5032 Chronic diastolic (congestive) heart failure: Secondary | ICD-10-CM

## 2023-08-12 DIAGNOSIS — K319 Disease of stomach and duodenum, unspecified: Secondary | ICD-10-CM | POA: Diagnosis not present

## 2023-08-12 DIAGNOSIS — R112 Nausea with vomiting, unspecified: Secondary | ICD-10-CM

## 2023-08-12 DIAGNOSIS — I2510 Atherosclerotic heart disease of native coronary artery without angina pectoris: Secondary | ICD-10-CM

## 2023-08-12 DIAGNOSIS — K449 Diaphragmatic hernia without obstruction or gangrene: Secondary | ICD-10-CM

## 2023-08-12 DIAGNOSIS — R300 Dysuria: Secondary | ICD-10-CM | POA: Diagnosis not present

## 2023-08-12 DIAGNOSIS — I110 Hypertensive heart disease with heart failure: Secondary | ICD-10-CM | POA: Diagnosis not present

## 2023-08-12 DIAGNOSIS — K920 Hematemesis: Secondary | ICD-10-CM | POA: Diagnosis not present

## 2023-08-12 DIAGNOSIS — R1013 Epigastric pain: Secondary | ICD-10-CM | POA: Diagnosis not present

## 2023-08-12 DIAGNOSIS — I10 Essential (primary) hypertension: Secondary | ICD-10-CM | POA: Diagnosis not present

## 2023-08-12 HISTORY — PX: BIOPSY: SHX5522

## 2023-08-12 HISTORY — PX: ESOPHAGOGASTRODUODENOSCOPY (EGD) WITH PROPOFOL: SHX5813

## 2023-08-12 LAB — COMPREHENSIVE METABOLIC PANEL
ALT: 47 U/L — ABNORMAL HIGH (ref 0–44)
AST: 22 U/L (ref 15–41)
Albumin: 3.3 g/dL — ABNORMAL LOW (ref 3.5–5.0)
Alkaline Phosphatase: 103 U/L (ref 38–126)
Anion gap: 10 (ref 5–15)
BUN: 13 mg/dL (ref 8–23)
CO2: 22 mmol/L (ref 22–32)
Calcium: 8.9 mg/dL (ref 8.9–10.3)
Chloride: 109 mmol/L (ref 98–111)
Creatinine, Ser: 0.75 mg/dL (ref 0.44–1.00)
GFR, Estimated: >60 mL/min (ref >60)
Glucose, Bld: 94 mg/dL (ref 70–99)
Potassium: 3.5 mmol/L (ref 3.5–5.1)
Sodium: 141 mmol/L (ref 135–145)
Total Bilirubin: 0.3 mg/dL (ref 0.0–1.2)
Total Protein: 5.5 g/dL — ABNORMAL LOW (ref 6.5–8.1)

## 2023-08-12 LAB — OCCULT BLOOD GASTRIC / DUODENUM (SPECIMEN CUP): Occult Blood, Gastric: NEGATIVE

## 2023-08-12 LAB — GLUCOSE, CAPILLARY: Glucose-Capillary: 96 mg/dL (ref 70–99)

## 2023-08-12 LAB — CBC
HCT: 37.4 % (ref 36.0–46.0)
Hemoglobin: 12.6 g/dL (ref 12.0–15.0)
MCH: 30.1 pg (ref 26.0–34.0)
MCHC: 33.7 g/dL (ref 30.0–36.0)
MCV: 89.5 fL (ref 80.0–100.0)
Platelets: 133 10*3/uL — ABNORMAL LOW (ref 150–400)
RBC: 4.18 MIL/uL (ref 3.87–5.11)
RDW: 13.2 % (ref 11.5–15.5)
WBC: 4.5 10*3/uL (ref 4.0–10.5)
nRBC: 0 % (ref 0.0–0.2)

## 2023-08-12 SURGERY — ESOPHAGOGASTRODUODENOSCOPY (EGD) WITH PROPOFOL
Anesthesia: Monitor Anesthesia Care

## 2023-08-12 MED ORDER — PROPOFOL 10 MG/ML IV BOLUS
INTRAVENOUS | Status: DC | PRN
  Administered 2023-08-12: 50 mg via INTRAVENOUS

## 2023-08-12 MED ORDER — CEPHALEXIN 500 MG PO CAPS
500.0000 mg | ORAL_CAPSULE | Freq: Two times a day (BID) | ORAL | 0 refills | Status: DC
  Filled 2023-08-12: qty 6, 3d supply, fill #0

## 2023-08-12 MED ORDER — GLYCOPYRROLATE 0.2 MG/ML IJ SOLN
INTRAMUSCULAR | Status: DC | PRN
  Administered 2023-08-12: .1 mg via INTRAVENOUS

## 2023-08-12 MED ORDER — TRAMADOL HCL 50 MG PO TABS: 50.0000 mg | ORAL_TABLET | Freq: Four times a day (QID) | ORAL | 0 refills | Status: AC | PRN

## 2023-08-12 MED ORDER — SODIUM CHLORIDE 0.9 % IV SOLN
1.0000 g | Freq: Once | INTRAVENOUS | Status: AC
  Administered 2023-08-12: 1 g via INTRAVENOUS
  Filled 2023-08-12: qty 10

## 2023-08-12 MED ORDER — LIDOCAINE 2% (20 MG/ML) 5 ML SYRINGE
INTRAMUSCULAR | Status: DC | PRN
  Administered 2023-08-12: 50 mg via INTRAVENOUS

## 2023-08-12 MED ORDER — SODIUM CHLORIDE 0.9 % IV SOLN
INTRAVENOUS | Status: AC | PRN
  Administered 2023-08-12: 250 mL via INTRAVENOUS

## 2023-08-12 MED ORDER — PROPOFOL 500 MG/50ML IV EMUL
INTRAVENOUS | Status: DC | PRN
  Administered 2023-08-12: 50 ug/kg/min via INTRAVENOUS

## 2023-08-12 SURGICAL SUPPLY — 14 items

## 2023-08-12 NOTE — Transfer of Care (Signed)
Immediate Anesthesia Transfer of Care Note  Patient: Heather [name]  Procedure(s) Performed: ESOPHAGOGASTRODUODENOSCOPY (EGD) WITH PROPOFOL BIOPSY  Patient Location: PACU  Anesthesia Type:MAC  Level of Consciousness: drowsy  Airway & Oxygen Therapy: Patient Spontanous Breathing  Post-op Assessment: Report given to RN and Post -op Vital signs reviewed and stable  Post vital signs: Reviewed and stable  Last Vitals:  Vitals Value Taken Time  BP    Temp    Pulse 58 08/12/23 0930  Resp 22 08/12/23 0930  SpO2 92 % 08/12/23 0930  Vitals shown include unfiled device data.  Last Pain:  Vitals:   08/12/23 0729  TempSrc: Temporal  PainSc: 0-No pain         Complications: No notable events documented.

## 2023-08-12 NOTE — Anesthesia Preprocedure Evaluation (Signed)
Anesthesia Evaluation  Patient identified by MRN, date of birth, ID band Patient awake    Reviewed: Allergy & Precautions, NPO status , Patient's Chart, lab work & pertinent test results, reviewed documented beta blocker date and time   Airway Mallampati: III  TM Distance: <3 FB Neck ROM: Full    Dental  (+) Dental Advisory Given, Missing, Caps   Pulmonary neg pulmonary ROS   Pulmonary exam normal breath sounds clear to auscultation       Cardiovascular hypertension, Pt. on medications and Pt. on home beta blockers (-) angina + CAD and + Cardiac Stents  Normal cardiovascular exam+ dysrhythmias  Rhythm:Regular Rate:Normal     Neuro/Psych  Headaches PSYCHIATRIC DISORDERS Anxiety Depression    TIA Neuromuscular disease CVA, No Residual Symptoms    GI/Hepatic Neg liver ROS,GERD  Medicated,,  Endo/Other  diabetes, Type 2    Renal/GU Renal InsufficiencyRenal disease     Musculoskeletal  (+) Arthritis ,    Abdominal   Peds  Hematology  (+) Blood dyscrasia, anemia   Anesthesia Other Findings Day of surgery medications reviewed with the patient.  Reproductive/Obstetrics                             Anesthesia Physical Anesthesia Plan  ASA: 3  Anesthesia Plan: MAC   Post-op Pain Management: Minimal or no pain anticipated   Induction: Intravenous  PONV Risk Score and Plan: 2 and TIVA and Treatment may vary due to age or medical condition  Airway Management Planned: Natural Airway and Simple Face Mask  Additional Equipment:   Intra-op Plan:   Post-operative Plan:   Informed Consent: I have reviewed the patients History and Physical, chart, labs and discussed the procedure including the risks, benefits and alternatives for the proposed anesthesia with the patient or authorized representative who has indicated his/her understanding and acceptance.     Dental advisory given  Plan  Discussed with: CRNA  Anesthesia Plan Comments:        Anesthesia Quick Evaluation

## 2023-08-12 NOTE — Discharge Instructions (Signed)
Heather [name],  You were in the hospital with abdominal pain. There was initial concern for a possible blockage of your biliary duct system. During your stay, your lab work actually improved. You also had some concern for GI bleeding; your upper endoscopy was negative for a reason for bleeding, thankfully. Biopsy was performed during your test. Your heart rate was also on th slower side, so I recommend you hold your metoprolol and follow-up with your prescribing physician. Lastly, while you were here you also had some burning with urination and have been started on an antibiotic.

## 2023-08-12 NOTE — Plan of Care (Signed)
°  Problem: Education: Goal: Knowledge of General Education information will improve Description: Including pain rating scale, medication(s)/side effects and non-pharmacologic comfort measures Outcome: Progressing   Problem: Activity: Goal: Risk for activity intolerance will decrease Outcome: Progressing   Problem: Nutrition: Goal: Adequate nutrition will be maintained Outcome: Progressing   Problem: Elimination: Goal: Will not experience complications related to bowel motility Outcome: Progressing   Problem: Safety: Goal: Ability to remain free from injury will improve Outcome: Progressing   Problem: Skin Integrity: Goal: Risk for impaired skin integrity will decrease Outcome: Progressing   

## 2023-08-12 NOTE — Op Note (Signed)
[facility] Patient Name: Heather [name] Procedure Date : 08/12/2023 MRN: [id number] Attending MD: Maren Beach , MD, [id number] Date of Birth: [birth year] CSN: [id number] Age: 89 Admit Type: Inpatient Procedure:                Upper GI endoscopy Indications:              Epigastric abdominal pain, Coffee-ground emesis Providers:                Maren Beach, MD, Fransisca Connors, Rozetta Nunnery, Technician Referring MD:              Medicines:                Monitored Anesthesia Care Complications:            No immediate complications. Estimated blood loss:                            Minimal. Estimated Blood Loss:     Estimated blood loss was minimal. Procedure:                Pre-Anesthesia Assessment:                           - Prior to the procedure, a History and Physical                            was performed, and patient medications and                            allergies were reviewed. The patient's tolerance of                            previous anesthesia was also reviewed. The risks                            and benefits of the procedure and the sedation                            options and risks were discussed with the patient.                            All questions were answered, and informed consent                            was obtained. Prior Anticoagulants: The patient has                            taken no anticoagulant or antiplatelet agents                            except for aspirin. ASA Grade Assessment: II - A  patient with mild systemic disease. After reviewing                            the risks and benefits, the patient was deemed in                            satisfactory condition to undergo the procedure.                           After obtaining informed consent, the endoscope was                            passed under direct vision. Throughout the                             procedure, the patient's blood pressure, pulse, and                            oxygen saturations were monitored continuously. The                            [id number]-[id number] ([id number]) Olympus endoscope was introduced                            through the mouth, and advanced to the second part                            of duodenum. The upper GI endoscopy was                            accomplished without difficulty. The patient                            tolerated the procedure well. Scope In: Scope Out: Findings:      The examined esophagus was normal.      Normal mucosa was found in the gastric body, in the gastric antrum, in       the cardia (on retroflexion) and in the gastric fundus (on       retroflexion). Biopsies were taken with a cold forceps for Helicobacter       pylori testing.      A medium-sized hiatal hernia was present. There was a small amount of       food debris within the hernia sac that could not be entirely displaced.       From the views obtained underneath the debris there was mild erythema       but no ulceration or stigmata of recent bleeding.      The duodenal bulb and second portion of the duodenum were normal.       Biopsies for histology were taken with a cold forceps for evaluation of       celiac disease. Impression:               - Normal esophagus.                           -  Normal mucosa was found in the gastric body, in                            the antrum, in the cardia and in the gastric fundus.                           - Medium-sized hiatal hernia with food debris and                            minimal erythema but no ulcerations or stigmata of                            recent bleeding.                           - Normal duodenal bulb and second portion of the                            duodenum. Biopsied.                           - No obvious source of recent bleeding identified                            on EGD. It is possible that the patient  experienced                            a small Mallory-Weiss tear that has since healed. Recommendation:           - Return patient to hospital ward for ongoing care.                           - Await pathology results.                           - In the absence of bleeding lesions in the stomach                            can transition IV pantoprazole to p.o. pantoprazole. Procedure Code(s):        --- Professional ---                           [phone], Esophagogastroduodenoscopy, flexible,                            transoral; with biopsy, single or multiple Diagnosis Code(s):        --- Professional ---                           K44.9, Diaphragmatic hernia without obstruction or                            gangrene  R10.13, Epigastric pain                           K92.0, Hematemesis CPT copyright 2022 [employer]. All rights reserved. The codes documented in this report are preliminary and upon coder review may  be revised to meet current compliance requirements. Maren Beach, MD 08/12/2023 9:29:28 AM This report has been signed electronically. Number of Addenda: 0

## 2023-08-12 NOTE — Anesthesia Postprocedure Evaluation (Signed)
Anesthesia Post Note  Patient: Heather [name]  Procedure(s) Performed: ESOPHAGOGASTRODUODENOSCOPY (EGD) WITH PROPOFOL BIOPSY     Patient location during evaluation: Endoscopy Anesthesia Type: MAC Level of consciousness: oriented, awake and alert and awake Pain management: pain level controlled Vital Signs Assessment: post-procedure vital signs reviewed and stable Respiratory status: spontaneous breathing, nonlabored ventilation and respiratory function stable Cardiovascular status: blood pressure returned to baseline and stable Postop Assessment: no headache, no backache and no apparent nausea or vomiting Anesthetic complications: no   No notable events documented.  Last Vitals:  Vitals:   08/12/23 0945 08/12/23 1055  BP: (!) 166/55 (!) 166/55  Pulse: (!) 50   Resp: 15   Temp: 36.4 C   SpO2: 97%     Last Pain:  Vitals:   08/12/23 1007  TempSrc:   PainSc: 0-No pain                 Collene Schlichter

## 2023-08-12 NOTE — Telephone Encounter (Signed)
I spoke on the telephone with Heather [name]'s daughter, Heather [name], to review results from her current hospitalization.   EGD was overall unremarkable-no peptic ulcer disease or other findings to explain her abdominal pain.  Biopsies were performed to rule out celiac disease and H. pylori.  Liver enzymes have almost normalized.   It has been discussed with Heather [name]'s family that her clinical picture consisting of abdominal pain and elevated liver enzymes may include retained stone status post cholecystectomy or postcholecystectomy sphincter of Oddi dysfunction.  MRCP imaging did not show a filling defect.  Daughter notes that pain did improve after Heather [name] received nitrates administered by EMS.  Our colleagues have discussed having Heather [name] evaluated by a biliary endoscopist for consideration of EUS +/- ERCP depending upon findings.  Will communicate with her primary GI, Dr. Adela Lank regarding plan.  Her family expressed concern about how to manage future episodes if they occur.  Advised that it would be best for her to come to the emergency room if she has another severe pain episode so that laboratory studies, particularly liver enzymes could be checked again.  Suggested the possibility of discharging her home with tramadol 25 to 50 mg to have available should a pain episode occur but that she would still need to come to the hospital for further evaluation.  All questions answered.

## 2023-08-12 NOTE — Discharge Summary (Signed)
Physician Discharge Summary   Patient: Heather [name] MRN: [id number] DOB: 20-Jun-1935  Admit date:     08/08/2023  Discharge date: 08/12/23  Discharge Physician: Jacquelin Hawking, MD   PCP: Bradd Canary, MD   Recommendations at discharge:  PCP visit for hospital follow-up GI visit for hospital follow-up; follow-up upper endoscopy biopsy results Repeat CBC in 3-5 days Follow-up urine culture final results  Discharge Diagnoses: Principal Problem:   Abdominal pain Active Problems:   Transaminitis   Coffee ground emesis   Essential hypertension   DM (diabetes mellitus) (HCC)   Hyperlipidemia   Gastroesophageal reflux disease with hiatal hernia   Abdominal pain, chronic, epigastric   LFTs abnormal   Nausea and vomiting  Resolved Problems:   * No resolved hospital problems. *  Hospital Course: Heather [name] is a 89 y.o. female with a history of hypertension, hyperlipidemia, chronic heart failure preserved EF, CAD, diabetes mellitus type 2.  Patient presented secondary to abdominal pain with associated hematemesis.  Initial CT imaging without evidence to explain patient's symptoms.  Initial labs were concerning for possible choledocholithiasis.  GI was consulted.  MRCP obtained which was significant for no evidence of choledocholithiasis or biliary duct dilation.  During hospitalization, patient developed black stools concerning for melena; FOBT negative. Upper endoscopy performed on 2/3 and was significant for no etiology/stigmata for/of recent bleeding.  Assessment and Plan:  Epigastric abdominal pain Unclear etiology. History of cholecystectomy. Initial CT angiogram of abdomen/pelvis significant for no etiology to explain patient's abdominal pain. Associated elevated alkaline phosphatase, AST, ALT concerning for possible choledocholithiasis. GI consulted. MRCP ordered and significant for no evidence of choledocholithiasis or biliary ductal dilation. Abdominal pain and alkaline  phosphatase/AST/ALT improved. GI reports some clinical suspicion for for choledocholithiasis, although are without EUS capabilities.   Coffee-ground emesis Noted and present on admission. Coffee-ground in description with concern for hematemesis. Hemoglobin stable. GI consulted and with initial plan for no upper endoscopy evaluation. Patient reported development of black stools, concerning for possible melena. Gastric and fecal occult testing is negative. Upper endoscopy performed on 2/3 and was significant for no etiology for possible bleeding; possible healed mallory-weiss tear(s).   Black stools Concerning for melena. In setting of reports of hematemesis. Hemoglobin stable. FOBT negative.   Primary hypertension Patient is on Imdur, amlodipine, losartan, metoprolol succinate as an outpatient. Patient with persistent bradycardia without metoprolol. Continue Imdur, amlodipine and losartan on discharge. Discontinue metoprolol on discharge.   Diabetes mellitus type 2 Controlled with last hemoglobin A1c of 6.8%.  Currently diet controlled.   Hyperlipidemia Patient is on simvastatin as outpatient which was held secondary to elevated LFTs.   GERD Hiatal hernia Prior history.  Hernia redemonstrated on imaging this admission.  Patient on Prilosec as an outpatient and started on Protonix this admission. Continue Prilosec.  Dysuria Possible UTI. Urine culture obtained. Patient started on Ceftriaxone IV. Urine culture results pending at time of discharge. Discharge on Keflex and follow-up urine culture results.   Chronic heart failure with preserved EF Stable. -Continue metoprolol succinate, losartan   Consultants: [facility] Procedures performed: Upper endoscopy  Disposition: Home Diet recommendation: Carb modified diet   DISCHARGE MEDICATION: Allergies as of 08/12/2023       Reactions   Baclofen Other (See Comments)   Hyperactivity   Gabapentin Other (See Comments)    Made her hyper   Nitrofurantoin Rash        Medication List     STOP taking these medications  metoprolol succinate 25 MG 24 hr tablet Commonly known as: TOPROL-XL       TAKE these medications    acetaminophen 500 MG tablet Commonly known as: TYLENOL Take 1,000 mg by mouth every 6 (six) hours as needed for moderate pain.   amLODipine 10 MG tablet Commonly known as: NORVASC TAKE 1 TABLET EVERY DAY   amoxicillin 500 MG capsule Commonly known as: AMOXIL Take 500 mg by mouth 3 (three) times daily. Use before going to the dentist   aspirin EC 81 MG tablet Take 1 tablet (81 mg total) by mouth daily.   cephALEXin 500 MG capsule Commonly known as: KEFLEX Take 1 capsule (500 mg total) by mouth 2 (two) times daily for 3 days. Start taking on: August 13, 2023   diclofenac Sodium 1 % Gel Commonly known as: VOLTAREN Apply 1 application topically 4 (four) times daily as needed (hip pain).   estradiol 0.1 MG/GM vaginal cream Commonly known as: ESTRACE Place 0.5 g vaginally 2 (two) times a week. Place 0.5g nightly for two weeks then twice a week after   furosemide 20 MG tablet Commonly known as: LASIX Take 20 mg by mouth as needed for fluid.   isosorbide mononitrate 60 MG 24 hr tablet Commonly known as: IMDUR TAKE 1 AND 1/2 TABLETS EVERY DAY   losartan 100 MG tablet Commonly known as: COZAAR TAKE 1 TABLET EVERY DAY What changed:  how much to take when to take this   Magnesium Oxide 250 MG Tabs Take 250 mg by mouth every morning.   omeprazole 20 MG capsule Commonly known as: PRILOSEC TAKE 1 CAPSULE EVERY DAY AS NEEDED (NEED OFFICE VISIT BEFORE ANY FURTHER REFILLS) What changed: See the new instructions.   Optase Comfort Dry Eye 1 % Soln Generic drug: Glycerin (PF) Place 1 drop into both eyes in the morning and at bedtime.   Pataday 0.2 % Soln Generic drug: Olopatadine HCl Place 1 drop into both eyes daily.   pramipexole 0.25 MG tablet Commonly known  as: MIRAPEX TAKE 1 TABLET TWICE DAILY   simvastatin 20 MG tablet Commonly known as: ZOCOR TAKE 1 TABLET AT BEDTIME   True Metrix Air Glucose Meter w/Device Kit USE TO CHECK BLOOD SUGAR ONCE DAILY AND AS NEEDED.  DX CODE E11.9   True Metrix Blood Glucose Test test strip Generic drug: glucose blood TEST BLOOD SUGAR EVERY DAY  OR AS NEEDED   TRUEplus Lancets 33G Misc USE TO CHECK BLOOD SUGAR ONCE DAILY AND AS NEEDED.  DX CODE: E11.9   Vibegron 75 MG Tabs Take 1 tablet (75 mg total) by mouth daily. What changed:  when to take this reasons to take this   ZINC PO Take 50 mg by mouth daily. with Vitamin D 2000IU capsule daily        Follow-up Information     Bradd Canary, MD. Schedule an appointment as soon as possible for a visit in 1 week(s).   Specialty: Family Medicine Why: For hospital follow-up Contact information: [address] [place] [address] [phone]         Benancio Deeds, MD. Schedule an appointment as soon as possible for a visit.   Specialty: Gastroenterology Why: Biopsy results. Contact information: [address] [place] [address] [phone]                Discharge Exam: BP (!) 166/55   Pulse (!) 50   Temp 97.6 F (36.4 C)  Resp 15   Ht 5\' 2"  (1.575 m)   Wt 61.6 kg   LMP  (LMP Unknown)   SpO2 97%   BMI 24.84 kg/m   General exam: Appears calm and comfortable Respiratory system: Clear to auscultation. Respiratory effort normal. Cardiovascular system: S1 & S2 heard, bradycardia, normal rhythm. No murmurs. Gastrointestinal system: Abdomen is nondistended, soft and nontender. Normal bowel sounds heard. Central nervous system: Alert and oriented. No focal neurological deficits. Psychiatry: Judgement and insight appear normal. Mood & affect appropriate.   Condition at discharge: stable  The results of significant diagnostics from this hospitalization (including imaging,  microbiology, ancillary and laboratory) are listed below for reference.   Imaging Studies: MR ABDOMEN MRCP W WO CONTAST Result Date: 08/09/2023 CLINICAL DATA:  Elevated LFTs, abdominal pain EXAM: MRI ABDOMEN WITHOUT AND WITH CONTRAST (INCLUDING MRCP) TECHNIQUE: Multiplanar multisequence MR imaging of the abdomen was performed both before and after the administration of intravenous contrast. Heavily T2-weighted images of the biliary and pancreatic ducts were obtained, and three-dimensional MRCP images were rendered by post processing. CONTRAST:  7.40mL GADAVIST GADOBUTROL 1 MMOL/ML IV SOLN COMPARISON:  CT angiogram abdomen pelvis, 08/09/2023 FINDINGS: Examination is generally limited by breath motion artifact throughout. Lower chest: No acute abnormality. Moderate hiatal hernia with intrathoracic position of the gastric fundus. Hepatobiliary: No focal liver abnormality is seen. Status post cholecystectomy. Unchanged postoperative biliary ductal dilatation. No calculus or other obstruction identified to the ampulla (series 3, image 18). Pancreas: Unremarkable. No pancreatic ductal dilatation or surrounding inflammatory changes. Spleen: Normal in size without significant abnormality. Adrenals/Urinary Tract: Adrenal glands are unremarkable. Simple, benign right renal cortical cysts, for which no further follow-up or characterization is required. Kidneys are otherwise normal, without renal calculi, solid lesion, or hydronephrosis. Stomach/Bowel: Stomach is within normal limits. No evidence of bowel wall thickening, distention, or inflammatory changes. Vascular/Lymphatic: Severe aortic atherosclerosis. No enlarged abdominal lymph nodes. Other: No abdominal wall hernia or abnormality. No ascites. Musculoskeletal: No acute or significant osseous findings. IMPRESSION: 1. Examination is generally limited by breath motion artifact throughout. 2. Status post cholecystectomy. Unchanged postoperative biliary ductal dilatation.  No evidence of choledocholithiasis or other obstruction. 3. Moderate hiatal hernia with intrathoracic position of the gastric fundus. 4. Severe aortic atherosclerosis. Aortic Atherosclerosis (ICD10-I70.0). Electronically Signed   By: Jearld Lesch M.D.   On: 08/09/2023 16:20   US Abdomen Limited Result Date: 08/09/2023 CLINICAL DATA:  Transaminitis EXAM: ULTRASOUND ABDOMEN LIMITED RIGHT UPPER QUADRANT COMPARISON:  Ultrasound 05/27/2017.  CTA earlier 08/09/2023. FINDINGS: Gallbladder: Previous cholecystectomy Common bile duct: Diameter: 7 mm. Minimal central intrahepatic biliary duct ectasia as seen on CT Liver: No focal lesion identified. Within normal limits in parenchymal echogenicity. Portal vein is patent on color Doppler imaging with normal direction of blood flow towards the liver. Other: None. IMPRESSION: Previous cholecystectomy. Minimal central intrahepatic biliary duct dilatation as seen on CT. Electronically Signed   By: Karen Kays M.D.   On: 08/09/2023 09:57   CT Angio Abd/Pel W and/or Wo Contrast Result Date: 08/09/2023 CLINICAL DATA:  Abdominal pain EXAM: CTA ABDOMEN AND PELVIS WITHOUT AND WITH CONTRAST TECHNIQUE: Multidetector CT imaging of the abdomen and pelvis was performed using the standard protocol during bolus administration of intravenous contrast. Multiplanar reconstructed images and MIPs were obtained and reviewed to evaluate the vascular anatomy. RADIATION DOSE REDUCTION: This exam was performed according to the departmental dose-optimization program which includes automated exposure control, adjustment of the mA and/or kV according to patient size and/or use of iterative reconstruction  technique. CONTRAST:  75mL OMNIPAQUE IOHEXOL 350 MG/ML SOLN COMPARISON:  08/07/2023 FINDINGS: VASCULAR Aorta: Atherosclerotic calcifications are noted. No aneurysmal dilatation or dissection is seen. Celiac: Plaque is noted at the origin with mild stenosis. No significant poststenotic dilatation is  seen. SMA: Patent without evidence of aneurysm, dissection, vasculitis or significant stenosis. Renals: Both renal arteries are patent without evidence of aneurysm, dissection, vasculitis, fibromuscular dysplasia or significant stenosis. IMA: Patent without evidence of aneurysm, dissection, vasculitis or significant stenosis. Inflow: Iliacs demonstrate atherosclerotic calcification without focal abnormality. Veins: No specific venous abnormality is noted. Review of the MIP images confirms the above findings. NON-VASCULAR Lower chest: No acute abnormality. Hepatobiliary: No focal liver abnormality is seen. Status post cholecystectomy. No biliary dilatation. Pancreas: Unremarkable. No pancreatic ductal dilatation or surrounding inflammatory changes. Spleen: Normal in size without focal abnormality. Adrenals/Urinary Tract: Adrenal glands are within normal limits. Kidneys are well visualize within normal enhancement pattern. Simple renal cysts are noted on the right. No further follow-up is recommended. The ureters are within normal limits. The bladder is decompressed. Air is seen within the bladder stable from the prior exam. Stomach/Bowel: Mild diverticular change of the colon is noted. No diverticulitis is seen. The appendix is within normal limits. Stomach shows a large hiatal hernia. Small bowel is within normal limits. Lymphatic: Aortic atherosclerosis. No enlarged abdominal or pelvic lymph nodes. Reproductive: Status post hysterectomy. No adnexal masses. Other: Postsurgical changes are noted in the anterior abdominal wall consistent with prior ventral hernia repair. No recurrent hernia is seen. The previously seen fluid collection in the operative bed is not well visualized on today's exam. Musculoskeletal: No acute bony abnormality noted. IMPRESSION: VASCULAR Scattered atherosclerotic calcifications are noted without complicating factors. No acute findings to suggest mesenteric ischemia are noted. NON-VASCULAR  Diverticular change without diverticulitis. No acute abnormality is noted. Electronically Signed   By: Alcide Clever M.D.   On: 08/09/2023 02:11   CT ABDOMEN PELVIS WO CONTRAST Result Date: 08/08/2023 CLINICAL DATA:  Supraumbilical pain after hernia repair. EXAM: CT ABDOMEN AND PELVIS WITHOUT CONTRAST TECHNIQUE: Multidetector CT imaging of the abdomen and pelvis was performed following the standard protocol without IV contrast. RADIATION DOSE REDUCTION: This exam was performed according to the departmental dose-optimization program which includes automated exposure control, adjustment of the mA and/or kV according to patient size and/or use of iterative reconstruction technique. COMPARISON:  CT 03/08/2023 FINDINGS: Lower chest: Calcified granuloma in the right lower lobe. Moderate-sized hiatal hernia. No basilar airspace disease or pleural effusion. Hepatobiliary: Unremarkable unenhanced appearance of the liver. Clips in the gallbladder fossa postcholecystectomy. No biliary dilatation. Pancreas: No ductal dilatation or inflammation. Spleen: Normal in size without focal abnormality. Adrenals/Urinary Tract: No adrenal nodule. Bilateral renal parenchymal thinning with lobulated renal contours. Right renal cysts. No further follow-up imaging is recommended. No hydronephrosis or renal calculi. Pelvic floor descent with cystocele, tiny focus of air in the bladder. No bladder wall thickening. Stomach/Bowel: Moderate-sized hiatal hernia. The distal stomach is decompressed. There is no bowel obstruction or inflammation. Ingested pills in the cecum. The appendix is not definitively seen. Moderate volume of colonic stool. Left colonic diverticulosis. No diverticulitis. Vascular/Lymphatic: Aortic and branch atherosclerosis. No aortic aneurysm. No enlarged lymph nodes in the abdomen or pelvis. Reproductive: Status post hysterectomy. No adnexal masses. Other: Prior upper ventral abdominal wall hernia repair. Improvement in  subcutaneous stranding from prior exam. Small elliptoid fluid persists in the sub muscular layer measuring 3.7 x 1.3 cm, series 3, image 28. No recurrent hernia. There is  no surrounding fat stranding to suggest inflammation. No free air or free fluid. Musculoskeletal: Multilevel degenerative change in the spine, minimal scoliosis. Mild bilateral hip degenerative change. There are no acute or suspicious osseous abnormalities. IMPRESSION: 1. Prior upper ventral abdominal wall hernia repair. Improvement in subcutaneous stranding from prior exam. Small elliptoid fluid persists in the sub muscular layer measuring 3.7 x 1.3 cm, likely postoperative seroma. No recurrent hernia. 2. Moderate-sized hiatal hernia. 3. Left colonic diverticulosis without diverticulitis. Aortic Atherosclerosis (ICD10-I70.0). Electronically Signed   By: Narda Rutherford M.D.   On: 08/08/2023 22:33   DG Chest 2 View Result Date: 08/08/2023 CLINICAL DATA:  Chest pain EXAM: CHEST - 2 VIEW COMPARISON:  02/13/2023 FINDINGS: The heart size and mediastinal contours are within normal limits. Aortic atherosclerosis. No focal airspace consolidation, pleural effusion, or pneumothorax. The visualized skeletal structures are unremarkable. IMPRESSION: No active cardiopulmonary disease. Electronically Signed   By: Duanne Guess D.O.   On: 08/08/2023 19:44    Microbiology: Results for orders placed or performed during the hospital encounter of 08/08/23  Urine Culture (for pregnant, neutropenic or urologic patients or patients with an indwelling urinary catheter)     Status: None (Preliminary result)   Collection Time: 08/11/23  9:08 AM   Specimen: Urine, Clean Catch  Result Value Ref Range Status   Specimen Description URINE, CLEAN CATCH  Final   Special Requests NONE  Final   Culture   Final    CULTURE REINCUBATED FOR BETTER GROWTH Performed at [facility], [address] [place], [place] [address]    Report Status PENDING   Incomplete    Labs: CBC: Recent Labs  Lab 08/08/23 1825 08/09/23 1658 08/10/23 0539 08/11/23 0700 08/12/23 0516  WBC 6.2 4.6 4.8 4.5 4.5  NEUTROABS 4.0  --   --   --   --   HGB 13.4 13.4 12.7 12.7 12.6  HCT 41.0 40.6 38.3 39.1 37.4  MCV 91.1 89.8 90.5 91.4 89.5  PLT 176 147* 134* 146* 133*   Basic Metabolic Panel: Recent Labs  Lab 08/08/23 1825 08/09/23 0909 08/10/23 0539 08/11/23 0700 08/12/23 0516  NA 140 141 141 140 141  K 3.9 3.8 3.6 3.6 3.5  CL 105 105 108 109 109  CO2 26 25 24 22 22   GLUCOSE 120* 86 81 95 94  BUN 19 11 8 12 13   CREATININE 0.96 0.80 0.75 0.86 0.75  CALCIUM 9.3 9.2 9.1 9.0 8.9   Liver Function Tests: Recent Labs  Lab 08/09/23 0909 08/09/23 1658 08/10/23 0539 08/11/23 0700 08/12/23 0516  AST 102* 102* 58* 31 22  ALT 127* 130* 92* 65* 47*  ALKPHOS 150* 151* 130* 117 103  BILITOT 0.5 0.8 0.8 0.5 0.3  PROT 6.1* 6.2* 5.8* 5.6* 5.5*  ALBUMIN 3.7 3.8 3.4* 3.4* 3.3*   CBG: Recent Labs  Lab 08/12/23 0932  GLUCAP 96    Discharge time spent: 35 minutes.  Signed: Jacquelin Hawking, MD [facility] 08/12/2023

## 2023-08-12 NOTE — H&P (Signed)
[facility] History and Physical   Primary Care Physician:  Bradd Canary, MD   Reason for Procedure:  Abdominal pain, coffee-ground emesis  Plan:    Upper endoscopy     HPI: Heather [name] is a 89 y.o. female undergoing upper endoscopy for evaluation of epigastric abdominal pain and coffee-ground emesis.  Patient reports a history of escalating colicky abdominal pain over the last 5 weeks.  Most recent episode of pain was associated with elevated liver enzymes.  No retained stones on MRCP.  States she had emesis when she came in and there may have been some dark material.  No further pain or vomiting since admission.   Past Medical History:  Diagnosis Date   Aortic atherosclerosis (HCC) 10/12/2021   Cancer (HCC)    Uterine   Cataract    Chronic heart failure with preserved ejection fraction (HFpEF) (HCC)    Chronic stable angina (HCC)    Coronary artery disease cardiologist--- dr Excell Seltzer   Diabetes mellitus type 2, diet-controlled (HCC)    followed by pcp  (10-19-2020  pt stated checks daily in am,  fasting blood surgar--- 115--120s)   DOE (dyspnea on exertion)    per pt "when I over do",  ok with household chores   Echocardiogram 08/2020    Echocardiogram 2/22: EF 55-60, no RWMA, mild LVH, Gr 2 DD, GLS-21.7%, normal RVSF, trivial MR, RVSP 39.5   Edema of right lower extremity    GERD (gastroesophageal reflux disease)    Hiatal hernia    recurrence,  hx HH repair 1989   History of cervical cancer    s/p  vaginal hysterectomy   History of DVT of lower extremity 2016   11-29-2014 post op right TKA of right lower extremity and completed xarelto    History of esophageal stricture    hx s/p dilatation's   History of gastric ulcer 2005 approx.   History of palpitations 2010   event monitor 07-07-2009 showed NSR w/ freq. SVT ectopies with short runs, rare PVCs   History of TIA (transient ischemic attack) 06/1999   12-15-2019  per pt had several TIA between 12/ 2000  to 02/ 2001 , was sent to specialist @[facility] , had test that was normal (10-19-2020 pt stated no TIAs since ) but has residual of essential tremors of right arm/ hand   Hypertension    Intermittent palpitations    IT band syndrome    Migraine    "ice pick headche lasts about 30 seconds"   Mixed hyperlipidemia    Mixed incontinence urge and stress    Multiple thyroid nodules    followed by pcp---   ultrasound 11-22-2014 no bx   (12-15-2019 per pt had a endocrinologist and was told did not need bx)   OA (osteoarthritis)    knees, elbow, hip, ankles   Occasional tremors    right arm/ hand  s/p TIA residual 2000   Osteoporosis    taking vitamin d   Peroneal DVT (deep venous thrombosis) (HCC) 12/22/2014   Right bundle branch block (RBBB) with left anterior fascicular block (LAFB)    RLS (restless legs syndrome)    S/P drug eluting coronary stent placement 2006   03-16-2005  PCI x1 DES to LCx;   06-27-2005  PCI x1 DES to RCA   Stroke [facility])    TIA's   Urinary retention    post op sling prodecure on 10-27-2020, has foley cathether    Past Surgical History:  Procedure Laterality Date  ANTERIOR AND POSTERIOR REPAIR N/A 12/22/2019   Procedure: ANTERIOR (CYSTOCELE)  REPAIR;  Surgeon: Sherian Rein, MD;  Location: [facility];  Service: Gynecology;  Laterality: N/A;   ANTERIOR AND POSTERIOR REPAIR WITH SACROSPINOUS FIXATION N/A 10/27/2020   Procedure: SACROSPINOUS LIGAMENT FIXATION;  Surgeon: Marguerita Beards, MD;  Location: [facility];  Service: Gynecology;  Laterality: N/A;   BLADDER SUSPENSION N/A 10/27/2020   Procedure: TRANSVAGINAL TAPE (TVT) PROCEDURE;  Surgeon: Marguerita Beards, MD;  Location: [facility];  Service: Gynecology;  Laterality: N/A;   CATARACT EXTRACTION W/ INTRAOCULAR LENS  IMPLANT, BILATERAL  2015   CHOLECYSTECTOMY N/A 01/25/2021   Procedure: LAPAROSCOPIC CHOLECYSTECTOMY WITH INTRAOPERATIVE CHOLANGIOGRAM AND  LYSIS OF ADHESIONS;  Surgeon: Karie Soda, MD;  Location: [facility];  Service: General;  Laterality: N/A;   COLONOSCOPY  last one ?   CORONARY ANGIOPLASTY WITH STENT PLACEMENT  03-16-2005   dr Riley Kill   PCI and DES x1 to LCx   CORONARY ANGIOPLASTY WITH STENT PLACEMENT  06-27-2005  dr Riley Kill   PCI and DES x1 to RCA with residual disease LAD 70-80% to manage medically   CYSTOSCOPY N/A 10/27/2020   Procedure: CYSTOSCOPY;  Surgeon: Marguerita Beards, MD;  Location: [facility];  Service: Gynecology;  Laterality: N/A;   CYSTOSCOPY N/A 11/09/2020   Procedure: CYSTOSCOPY;  Surgeon: Marguerita Beards, MD;  Location: [facility];  Service: Gynecology;  Laterality: N/A;   EYE SURGERY     FOOT SURGERY Left 1990s   left foot stress fracture repair, per pt no hardware   FRACTURE SURGERY     HERNIA REPAIR     HIATAL HERNIA REPAIR  1989   INCISIONAL HERNIA REPAIR N/A 01/25/2021   Procedure: PRIMARY REPAIR OF INCISIONAL HERNIA;  Surgeon: Karie Soda, MD;  Location: [facility];  Service: General;  Laterality: N/A;   INGUINAL HERNIA REPAIR Left 04/17/2021   Procedure: LAPAROSCOPIC LEFT INGUINAL HERNIA REPAIR WITH MESH;  Surgeon: Axel Filler, MD;  Location: [facility];  Service: General;  Laterality: Left;   JOINT REPLACEMENT     KNEE ARTHROSCOPY Bilateral right ?/   left x2 , last one 09-12-2009 [facility]   LYSIS OF ADHESION N/A 02/11/2023   Procedure: LYSIS OF ADHESION;  Surgeon: Axel Filler, MD;  Location: [facility];  Service: General;  Laterality: N/A;   PUBOVAGINAL SLING N/A 11/09/2020   Procedure: REVISION OF Leonides Grills;  Surgeon: Marguerita Beards, MD;  Location: [facility];  Service: Gynecology;  Laterality: N/A;   RECTOCELE REPAIR N/A 04/20/2020   Procedure: POSTERIOR REPAIR (RECTOCELE);  Surgeon: Sherian Rein, MD;  [facility];  Service: Gynecology;  Laterality: N/A;   RECTOCELE REPAIR N/A 10/27/2020    Procedure: POSTERIOR REPAIR (RECTOCELE);  Surgeon: Marguerita Beards, MD;  Location: [facility];  Service: Gynecology;  Laterality: N/A;  total time requested for all procedures is 2 hours   SMALL INTESTINE SURGERY     TOTAL KNEE ARTHROPLASTY  11/12/2011   Procedure: TOTAL KNEE ARTHROPLASTY;  Surgeon: Nilda Simmer, MD;  Location: [facility];  Service: Orthopedics;  Laterality: Left;  Dr Thurston Hole wants 90 minutes for this case   TOTAL KNEE ARTHROPLASTY Right 11/29/2014   Procedure: RIGHT TOTAL KNEE ARTHROPLASTY;  Surgeon: Dannielle Huh, MD;  Location: [facility];  Service: Orthopedics;  Laterality: Right;   UPPER GASTROINTESTINAL ENDOSCOPY  last one 04-25-2017   with dilatation esophageal stricture and savary dilatation  VAGINAL HYSTERECTOMY  1988    no ovaries removed for bleeeding   VENTRAL HERNIA REPAIR N/A 02/11/2023   Procedure: LAPAROSCOPIC ATTEMPTED OPEN VENTRAL HERNIA REPAIR WITH MESH;  Surgeon: Axel Filler, MD;  Location[facility];  Service: General;  Laterality: N/A;  2.5 HRS    Prior to Admission medications   Medication Sig Start Date End Date Taking? Authorizing Provider  acetaminophen (TYLENOL) 500 MG tablet Take 1,000 mg by mouth every 6 (six) hours as needed for moderate pain.   Yes [provider]  amLODipine (NORVASC) 10 MG tablet TAKE 1 TABLET EVERY DAY 11/19/22  Yes Bradd Canary, MD  amoxicillin (AMOXIL) 500 MG capsule Take 500 mg by mouth 3 (three) times daily. Use before going to the dentist 07/25/23  Yes [provider]  aspirin EC 81 MG tablet Take 1 tablet (81 mg total) by mouth daily. 05/04/15  Yes Tonny Bollman, MD  diclofenac Sodium (VOLTAREN) 1 % GEL Apply 1 application topically 4 (four) times daily as needed (hip pain).   Yes [provider]  estradiol (ESTRACE) 0.1 MG/GM vaginal cream Place 0.5 g vaginally 2 (two) times a week. Place 0.5g nightly for two weeks then twice a week after 03/28/23  Yes Marguerita Beards, MD   furosemide (LASIX) 20 MG tablet Take 20 mg by mouth as needed for fluid.   Yes [provider]  Glycerin, PF, (OPTASE COMFORT DRY EYE) 1 % SOLN Place 1 drop into both eyes in the morning and at bedtime.   Yes [provider]  isosorbide mononitrate (IMDUR) 60 MG 24 hr tablet TAKE 1 AND 1/2 TABLETS EVERY DAY 03/05/23  Yes Tonny Bollman, MD  losartan (COZAAR) 100 MG tablet TAKE 1 TABLET EVERY DAY Patient taking differently: Take 50 mg by mouth 2 (two) times daily. 01/07/23  Yes Tonny Bollman, MD  Magnesium Oxide 250 MG TABS Take 250 mg by mouth every morning.   Yes [provider]  metoprolol succinate (TOPROL-XL) 25 MG 24 hr tablet TAKE 1/2 TABLET EVERY DAY 03/21/23  Yes Bradd Canary, MD  Multiple Vitamins-Minerals (ZINC PO) Take 50 mg by mouth daily. with Vitamin D 2000IU capsule daily   Yes [provider]  Olopatadine HCl (PATADAY) 0.2 % SOLN Place 1 drop into both eyes daily.   Yes [provider]  omeprazole (PRILOSEC) 20 MG capsule TAKE 1 CAPSULE EVERY DAY AS NEEDED (NEED OFFICE VISIT BEFORE ANY FURTHER REFILLS) Patient taking differently: Take 20 mg by mouth every morning. 07/11/22  Yes Bradd Canary, MD  pramipexole (MIRAPEX) 0.25 MG tablet TAKE 1 TABLET TWICE DAILY 10/08/22  Yes Bradd Canary, MD  Vibegron 75 MG TABS Take 1 tablet (75 mg total) by mouth daily. Patient taking differently: Take 1 tablet by mouth daily as needed (Urinary Issues). 03/28/23  Yes Marguerita Beards, MD  Blood Glucose Monitoring Suppl (TRUE METRIX AIR GLUCOSE METER) w/Device KIT USE TO CHECK BLOOD SUGAR ONCE DAILY AND AS NEEDED.  DX CODE E11.9 07/27/20   Bradd Canary, MD  glucose blood (TRUE METRIX BLOOD GLUCOSE TEST) test strip TEST BLOOD SUGAR EVERY DAY  OR AS NEEDED 09/21/21   Bradd Canary, MD  simvastatin (ZOCOR) 20 MG tablet TAKE 1 TABLET AT BEDTIME 02/01/23   Tonny Bollman, MD  TRUEplus Lancets 33G MISC USE TO CHECK BLOOD SUGAR ONCE DAILY AND AS NEEDED.   DX CODE: E11.9 07/27/20   Bradd Canary, MD    Current Facility-Administered Medications  Medication Dose Route Frequency Provider Last Rate Last Admin   0.9 %  sodium chloride infusion    Continuous PRN Ottie Glazier, MD   250 mL at 08/12/23 0733   [MAR Hold] acetaminophen (TYLENOL) tablet 650 mg  650 mg Oral Q6H PRN Clydie Braun, MD   650 mg at 08/09/23 2249   Or   [MAR Hold] acetaminophen (TYLENOL) suppository 650 mg  650 mg Rectal Q6H PRN Clydie Braun, MD       [MAR Hold] albuterol (PROVENTIL) (2.5 MG/3ML) 0.083% nebulizer solution 2.5 mg  2.5 mg Nebulization Q6H PRN Clydie Braun, MD       [MAR Hold] amLODipine (NORVASC) tablet 10 mg  10 mg Oral Daily Katrinka Blazing, Rondell A, MD   10 mg at 08/11/23 1005   [MAR Hold] cefTRIAXone (ROCEPHIN) 1 g in sodium chloride 0.9 % 100 mL IVPB  1 g Intravenous Q24H Narda Bonds, MD 200 mL/hr at 08/11/23 1557 1 g at 08/11/23 1557   [MAR Hold] diclofenac Sodium (VOLTAREN) 1 % topical gel 2 g  2 g Topical QID PRN Clydie Braun, MD       [MAR Hold] enoxaparin (LOVENOX) injection 40 mg  40 mg Subcutaneous Daily Katrinka Blazing, Rondell A, MD   40 mg at 08/11/23 1553   [MAR Hold] isosorbide mononitrate (IMDUR) 24 hr tablet 60 mg  60 mg Oral Daily Smith, Rondell A, MD   60 mg at 08/11/23 1006   [MAR Hold] losartan (COZAAR) tablet 50 mg  50 mg Oral BID Madelyn Flavors A, MD   50 mg at 08/11/23 2121   [facility] Hold] magnesium oxide (MAG-OX) tablet 400 mg  400 mg Oral q morning Katrinka Blazing, Rondell A, MD   400 mg at 08/11/23 1005   [MAR Hold] olopatadine (PATANOL) 0.1 % ophthalmic solution 1 drop  1 drop Both Eyes BID Madelyn Flavors A, MD   1 drop at 08/11/23 2119   [facility] Hold] ondansetron (ZOFRAN) tablet 4 mg  4 mg Oral Q6H PRN Clydie Braun, MD       Or   [facility] Hold] ondansetron (ZOFRAN) injection 4 mg  4 mg Intravenous Q6H PRN Clydie Braun, MD       [MAR Hold] Oral care mouth rinse  15 mL Mouth Rinse PRN Clydie Braun, MD       [MAR Hold] pantoprazole  (PROTONIX) injection 40 mg  40 mg Intravenous Q12H Smith, Rondell A, MD   40 mg at 08/11/23 2121   [MAR Hold] polyvinyl alcohol (LIQUIFILM TEARS) 1.4 % ophthalmic solution 1 drop  1 drop Both Eyes BID Madelyn Flavors A, MD   1 drop at 08/11/23 2120   [MAR Hold] pramipexole (MIRAPEX) tablet 0.25 mg  0.25 mg Oral BID Madelyn Flavors A, MD   0.25 mg at 08/11/23 2121   [facility] Hold] sodium chloride flush (NS) 0.9 % injection 3 mL  3 mL Intravenous Q12H Smith, Rondell A, MD   3 mL at 08/11/23 2122    Allergies as of 08/08/2023 - Review Complete 08/08/2023  Allergen Reaction Noted   Baclofen Other (See Comments) 05/04/2015   Gabapentin Other (See Comments) 02/01/2022   Nitrofurantoin Rash 07/14/2014    Family History  Problem Relation Age of Onset   Stroke Father        family hx of M 1st degree relative <50   Heart disease Father    Coronary artery disease Mother    Heart disease Mother  Arthritis Mother    Hearing loss Mother    Vision loss Mother    Depression Brother    Cancer Brother    Stroke Brother    Diabetes Brother    Cancer Brother        bladder with mets   Diabetes Daughter        borderline   Hypertension Daughter    Arthritis Other        family hx of   Hypertension Other        family hx of   Other Other        family hx of cardiovascular disorder   Thyroid disease Daughter    Early death Daughter    Breast cancer Neg Hx    Colon cancer Neg Hx    Anesthesia problems Neg Hx    Hypotension Neg Hx    Malignant hyperthermia Neg Hx    Pseudochol deficiency Neg Hx    Colon polyps Neg Hx    Esophageal cancer Neg Hx    Rectal cancer Neg Hx    Stomach cancer Neg Hx     Social History   Socioeconomic History   Marital status: Widowed    Spouse name: Not on file   Number of children: 3   Years of education: 41   Highest education level: Some college, no degree  Occupational History   Occupation: works partime in Public affairs consultant: RETIRED    Comment:  retired  Tobacco Use   Smoking status: Never   Smokeless tobacco: Never  Vaping Use   Vaping status: Never Used  Substance and Sexual Activity   Alcohol use: No    Alcohol/week: 0.0 standard drinks of alcohol   Drug use: Never   Sexual activity: Not Currently    Birth control/protection: Surgical    Comment: lives alone  Other Topics Concern   Not on file  Social History Narrative   Lives with husband, Caffeine use- Half Caffeine)- 2 cups daily.  3 children living, one passed away.   Education: HS. Business college.  Retired.    Social Drivers of Corporate investment banker Strain: Low Risk  (08/05/2023)   Overall Financial Resource Strain (CARDIA)    Difficulty of Paying Living Expenses: Not hard at all  Food Insecurity: No Food Insecurity (08/09/2023)   Hunger Vital Sign    Worried About Running Out of Food in the Last Year: Never true    Ran Out of Food in the Last Year: Never true  Transportation Needs: No Transportation Needs (08/09/2023)   PRAPARE - Administrator, Civil Service (Medical): No    Lack of Transportation (Non-Medical): No  Physical Activity: Inactive (08/05/2023)   Exercise Vital Sign    Days of Exercise per Week: 0 days    Minutes of Exercise per Session: 0 min  Stress: Stress Concern Present (08/05/2023)   [employer] of Occupational Health - Occupational Stress Questionnaire    Feeling of Stress : Very much  Social Connections: Moderately Isolated (08/09/2023)   Social Connection and Isolation Panel [NHANES]    Frequency of Communication with Friends and Family: More than three times a week    Frequency of Social Gatherings with Friends and Family: Three times a week    Attends Religious Services: More than 4 times per year    Active Member of Clubs or Organizations: No    Attends Banker Meetings: Never    Marital Status: Widowed  Intimate Partner Violence: Not At Risk (08/09/2023)   Humiliation, Afraid, Rape, and Kick  questionnaire    Fear of Current or Ex-Partner: No    Emotionally Abused: No    Physically Abused: No    Sexually Abused: No    Review of Systems:  All other review of systems negative except as mentioned in the HPI.  Physical Exam: Vital signs BP (!) 180/74   Pulse 62   Temp (!) 97.2 F (36.2 C) (Temporal)   Resp 16   Ht 5\' 2"  (1.575 m)   Wt 61.6 kg   LMP  (LMP Unknown)   SpO2 96%   BMI 24.84 kg/m   General:   Alert,  Well-developed, well-nourished, pleasant and cooperative in NAD Lungs:  Clear throughout to auscultation.   Heart:  Regular rate and rhythm; no murmurs, clicks, rubs,  or gallops. Abdomen:  Soft, nontender and nondistended. Normal bowel sounds.   Neuro/Psych:  Normal mood and affect. A and O x 3  Lab Results  Component Value Date   WBC 4.5 08/12/2023   HGB 12.6 08/12/2023   HCT 37.4 08/12/2023   MCV 89.5 08/12/2023   PLT 133 (L) 08/12/2023   Lab Results  Component Value Date   ALT 47 (H) 08/12/2023   AST 22 08/12/2023   ALKPHOS 103 08/12/2023   BILITOT 0.3 08/12/2023   Lab Results  Component Value Date   INR 1.1 08/10/2023   INR 1.1 02/07/2021   INR 0.98 11/16/2014     Maren Beach, MD [facility]

## 2023-08-12 NOTE — Plan of Care (Signed)
  Problem: Clinical Measurements: Goal: Will remain free from infection Outcome: Progressing Goal: Respiratory complications will improve Outcome: Progressing Goal: Cardiovascular complication will be avoided Outcome: Progressing   Problem: Activity: Goal: Risk for activity intolerance will decrease Outcome: Progressing   Problem: Nutrition: Goal: Adequate nutrition will be maintained Outcome: Progressing   Problem: Coping: Goal: Level of anxiety will decrease Outcome: Progressing   Problem: Pain Managment: Goal: General experience of comfort will improve and/or be controlled Outcome: Progressing

## 2023-08-13 ENCOUNTER — Encounter: Payer: Self-pay | Admitting: Pediatrics

## 2023-08-13 ENCOUNTER — Other Ambulatory Visit (HOSPITAL_COMMUNITY): Payer: Self-pay

## 2023-08-13 ENCOUNTER — Telehealth: Payer: Self-pay

## 2023-08-13 ENCOUNTER — Telehealth: Payer: Self-pay | Admitting: Family Medicine

## 2023-08-13 DIAGNOSIS — R7401 Elevation of levels of liver transaminase levels: Secondary | ICD-10-CM

## 2023-08-13 DIAGNOSIS — R7989 Other specified abnormal findings of blood chemistry: Secondary | ICD-10-CM

## 2023-08-13 LAB — URINE CULTURE: Culture: >=100000 — AB

## 2023-08-13 LAB — SURGICAL PATHOLOGY

## 2023-08-13 MED ORDER — AMOXICILLIN 500 MG PO CAPS: 500.0000 mg | ORAL_CAPSULE | Freq: Three times a day (TID) | ORAL | 0 refills | Status: AC

## 2023-08-13 NOTE — Telephone Encounter (Signed)
Your next available 7-day hold slot is 08/26/23. APPs next available is 2/13 and 2/14. Please advise on scheduling. Thanks

## 2023-08-13 NOTE — Telephone Encounter (Signed)
 Called and spoke with patient. Pt has been scheduled for a hospital f/u with Vina Dasen, NP on 08/22/23 at 1:30 pm. Patient will stop by for repeat LFTs prior to her appt. Pt is aware of lab location and hours. Pt verbalized understanding of information and had no concerns at the end of the call.  Lab order in epic.

## 2023-08-13 NOTE — Transitions of Care (Post Inpatient/ED Visit) (Signed)
 08/13/2023  Name: Heather [name] MRN: [id number] DOB: 1934/12/13  Today's TOC FU Call Status: Today's TOC FU Call Status:: Successful TOC FU Call Completed TOC FU Call Complete Date: 08/13/23 Patient's Name and Date of Birth confirmed.  Transition Care Management Follow-up Telephone Call Date of Discharge: 08/12/23 Discharge Facility: [facility]) Type of Discharge: Inpatient Admission Primary Inpatient Discharge Diagnosis:: Transaminitis How have you been since you were released from the hospital?: Better Any questions or concerns?: No  Items Reviewed: Did you receive and understand the discharge instructions provided?: Yes Medications obtained,verified, and reconciled?: Yes (Medications Reviewed) Any new allergies since your discharge?: No Dietary orders reviewed?: Yes Type of Diet Ordered:: low sodium, heart healthy Do you have support at home?: Yes People in Home: child(ren), adult  Medications Reviewed Today: Medications Reviewed Today     Reviewed by Jaline Pincock, RN (Case Manager) on 08/13/23 at 1508  Med List Status: <None>   Medication Order Taking? Sig Documenting Provider Last Dose Status Informant  acetaminophen  (TYLENOL ) 500 MG tablet [id number] Yes Take 1,000 mg by mouth every 6 (six) hours as needed for moderate pain. [provider] Taking Active Self, Child, Pharmacy Records  amLODipine  (NORVASC ) 10 MG tablet [id number] Yes TAKE 1 TABLET EVERY DAY Domenica Harlene LABOR, MD Taking Active Self, Child, Pharmacy Records           Med Note NADINE, [facility]   Thu Feb 14, 2023  7:17 AM)    amoxicillin  (AMOXIL ) 500 MG capsule [id number] Yes Take 500 mg by mouth 3 (three) times daily. Use before going to the dentist [provider] Taking Active Self, Child, Pharmacy Records  amoxicillin  (AMOXIL ) 500 MG capsule [id number] Yes Take 1 capsule (500 mg total) by mouth 3 (three) times daily for 5 days. Briana Elgin LABOR, MD Taking Active   aspirin  EC 81 MG  tablet [id number] Yes Take 1 tablet (81 mg total) by mouth daily. Wonda Sharper, MD Taking Active Self, Child, Pharmacy Records  Blood Glucose Monitoring Suppl (TRUE METRIX AIR GLUCOSE METER) w/Device KIT [id number] Yes USE TO CHECK BLOOD SUGAR ONCE DAILY AND AS NEEDED.  DX CODE E11.9 Domenica Harlene LABOR, MD Taking Active Self, Child, Pharmacy Records  diclofenac  Sodium (VOLTAREN ) 1 % GEL [id number] Yes Apply 1 application topically 4 (four) times daily as needed (hip pain). [provider] Taking Active Self, Child, Pharmacy Records  estradiol  (ESTRACE ) 0.1 MG/GM vaginal cream [id number] Yes Place 0.5 g vaginally 2 (two) times a week. Place 0.5g nightly for two weeks then twice a week after Marilynne Rosaline SAILOR, MD Taking Active Self, Child, Pharmacy Records  furosemide  (LASIX ) 20 MG tablet [id number] Yes Take 20 mg by mouth as needed for fluid. [provider] Taking Active Self, Child, Pharmacy Records  glucose blood (TRUE METRIX BLOOD GLUCOSE TEST) test strip [id number] Yes TEST BLOOD SUGAR EVERY DAY  OR AS NEEDED Domenica Harlene LABOR, MD Taking Active Self, Child, Pharmacy Records  Glycerin, PF, (OPTASE COMFORT DRY EYE) 1 % SOLN [id number] Yes Place 1 drop into both eyes in the morning and at bedtime. [provider] Taking Active Self, Child, Pharmacy Records  isosorbide  mononitrate (IMDUR ) 60 MG 24 hr tablet [id number] Yes TAKE 1 AND 1/2 TABLETS EVERY DAY Wonda Sharper, MD Taking Active Self, Child, Pharmacy Records  losartan  (COZAAR ) 100 MG tablet [id number] Yes TAKE 1 TABLET EVERY DAY  Patient taking differently: Take 50 mg by mouth 2 (two) times daily.   Wonda Sharper, MD Taking Active Self,  Child, Pharmacy Records  Magnesium  Oxide 250 MG TABS [id number] Yes Take 250 mg by mouth every morning. [provider] Taking Active Self, Child, Pharmacy Records           Med Note WANETTA JACKQUELINE JULIANNA Pablo Apr 17, 2021  7:56 AM)    Multiple Vitamins-Minerals (ZINC  PO) [id number]  Yes Take 50 mg by mouth daily. with Vitamin D  2000IU capsule daily [provider] Taking Active Self, Child, Pharmacy Records  Olopatadine  HCl (PATADAY ) 0.2 % SOLN [id number] Yes Place 1 drop into both eyes daily. [provider] Taking Active Self, Child, Pharmacy Records  omeprazole  (PRILOSEC) 20 MG capsule [id number] Yes TAKE 1 CAPSULE EVERY DAY AS NEEDED (NEED OFFICE VISIT BEFORE ANY FURTHER REFILLS)  Patient taking differently: Take 20 mg by mouth every morning.   Domenica Harlene LABOR, MD Taking Active Self, Child, Pharmacy Records  pramipexole  (MIRAPEX ) 0.25 MG tablet [id number] Yes TAKE 1 TABLET TWICE DAILY Domenica Harlene LABOR, MD Taking Active Self, Child, Pharmacy Records  simvastatin  (ZOCOR ) 20 MG tablet [id number] Yes TAKE 1 TABLET AT BEDTIME Wonda Sharper, MD Taking Active Self, Child, Pharmacy Records  traMADol  (ULTRAM ) 50 MG tablet [id number] Yes Take 1 tablet (50 mg total) by mouth every 6 (six) hours as needed for up to 3 days for severe pain (pain score 7-10). Suzann Inocente HERO, MD Taking Active   TRUEplus Lancets 33G MISC [id number] Yes USE TO CHECK BLOOD SUGAR ONCE DAILY AND AS NEEDED.  DX CODE: E11.9 Domenica Harlene LABOR, MD Taking Active Self, Child, Pharmacy Records  Vibegron  75 MG TABS [id number] Yes Take 1 tablet (75 mg total) by mouth daily.  Patient taking differently: Take 1 tablet by mouth daily as needed (Urinary Issues).   Marilynne Rosaline SAILOR, MD Taking Active Self, Child, Pharmacy Records            Home Care and Equipment/Supplies: Were Home Health Services Ordered?: NA Any new equipment or medical supplies ordered?: NA  Functional Questionnaire: Do you need assistance with bathing/showering or dressing?: No Do you need assistance with meal preparation?: No Do you need assistance with eating?: No Do you have difficulty maintaining continence: No Do you need assistance with getting out of bed/getting out of a chair/moving?: No Do you have difficulty  managing or taking your medications?: No  Follow up appointments reviewed: PCP Follow-up appointment confirmed?: No MD Provider Line [phone] Given: No Follow-up Provider: Dr. Harlene Heather - Patient will call today to make F/U appointment Specialist Hospital Follow-up appointment confirmed?: No Reason Specialist Follow-Up Not Confirmed: Patient has Specialist Provider Number and will Call for Appointment Do you need transportation to your follow-up appointment?: No Do you understand care options if your condition(s) worsen?: Yes-patient verbalized understanding  SDOH Interventions Today    Flowsheet Row Most Recent Value  SDOH Interventions   Food Insecurity Interventions Intervention Not Indicated  Housing Interventions Intervention Not Indicated  Transportation Interventions Intervention Not Indicated  Utilities Interventions Intervention Not Indicated      Interventions Today    Flowsheet Row Most Recent Value  Chronic Disease   Chronic disease during today's visit Diabetes, Other  [Transaminitis, Elevated Liver Enzymes]  General Interventions   General Interventions Discussed/Reviewed General Interventions Discussed, General Interventions Reviewed, Doctor Visits  Doctor Visits Discussed/Reviewed Doctor Visits Discussed, Doctor Visits Reviewed, PCP, Specialist  Education Interventions   Education Provided Provided Education  Provided Verbal Education On Labs, Medication, When to see the doctor  Labs Reviewed --  [Per hospital d/c  summary, they want her to have her lab work repeated in a week.  Patient states she will have it done when she schedules appt to see her PCP.]  Nutrition Interventions   Nutrition Discussed/Reviewed Fluid intake, Nutrition Reviewed, Nutrition Discussed  Pharmacy Interventions   Pharmacy Dicussed/Reviewed Medications and their functions  [During our telephone call, the patient received a telephone call from a Hospitalist from [facility]  indicating that after her culture,urine grew out, they needed to change her antibiotics to Amoxcillin x 5 days. Patient verb. understanding re: antib. changes]      Patient reports she is feeling much better,  Denies any unmet needs at this.  RNCM direct contact information provided  for questions or concerns that may arise. TOC program - reviewed and discussed with patient.   Declines to enroll in 30 Day TOC Program.                                                                                                                              Pasco Lunger BSN, RN RN Care Manager   Transitions of Care   / [facility], [facility] Direct Dial Number: [phone]  Fax: [phone]

## 2023-08-13 NOTE — Telephone Encounter (Signed)
-----   Message from Elspeth SHAUNNA Naval sent at 08/12/2023  6:20 PM EST ----- Regarding: FW: Coordinating care plan [facility] -  Not sure if I have any openings in the 7-day holds or my office in the next week or so to see this patient?  If so that would be great. If not let me know, we will see where we can add her in somewhere perhaps with an APP.  She will need some repeat LFTs in the next week or so and consideration for referral out to the network for EUS.  I would prefer to see her in the office before referring out.  Thanks ----- Message ----- From: Suzann Inocente HERO, MD Sent: 08/12/2023   1:21 PM EST To: Vina HERO Dasen, NP; Elspeth SHAUNNA Naval, MD; # Subject: Coordinating care plan                         Erskin Kemps-  I am touching base regarding one of your patients who was admitted to [facility] for abdominal pain, vomiting and coffee-ground emesis -Heather [name].  She presented to [facility] late last week with crescendoing episodes of epigastric abdominal pain.  This was previously felt to potentially be due to a hernia which she had repaired but the episodes have persisted. Had 1 episode of coffee-ground emesis with her recent admission.  Labs were noteworthy for elevated transaminases -AST 251, ALT 106, alk phos 148 on admission.  She is status post chole in 2022 and elevated LFTs raised concern for possible retained stone or SOD.  MRCP did not show any filling defects.  I did an EGD which was unremarkable.  She has been without pain since hospital admission.  Heather [name] had discussed with the family seeing if we could connect her with a biliary endoscopist for EUS +/- ERCP to evaluate for retained stone or consider if she may need a sphincterotomy if SOD is present.  Her daughter did mention that nitrates administered in the ambulance by EMS helped her pain.  With [facility] being out right now, Heather [name] suggested seeing if you had a trusted biliary colleague at one of the academic institutions to reach  out to to place a referral.  Family has been anxious about not having a definitive answer for her symptoms and are worried another episode will occur in the near future.  Let me know if you have any questions.  Best,  Inocente

## 2023-08-13 NOTE — Telephone Encounter (Signed)
APP visit on 2/13 or 2/14 should be fine. Thanks

## 2023-08-13 NOTE — Telephone Encounter (Signed)
Patient called stated that she was returning [place]'s call. Patient requested for a call back. Please advise.

## 2023-08-13 NOTE — Telephone Encounter (Signed)
Patient's urine culture significant for enterococcus faecalis. Patient discharged on Keflex which will not cover. Called patient and discussed reason for transition in medication treatment. Confirmed pharmacy with patient. Amoxicillin ordered for treatment.

## 2023-08-13 NOTE — Telephone Encounter (Signed)
 Lm on vm for patient to return call

## 2023-08-16 DIAGNOSIS — M79672 Pain in left foot: Secondary | ICD-10-CM | POA: Diagnosis not present

## 2023-08-19 ENCOUNTER — Encounter: Payer: Self-pay | Admitting: Gastroenterology

## 2023-08-19 ENCOUNTER — Other Ambulatory Visit (INDEPENDENT_AMBULATORY_CARE_PROVIDER_SITE_OTHER): Payer: Medicare HMO

## 2023-08-19 DIAGNOSIS — R7401 Elevation of levels of liver transaminase levels: Secondary | ICD-10-CM | POA: Diagnosis not present

## 2023-08-19 DIAGNOSIS — R7989 Other specified abnormal findings of blood chemistry: Secondary | ICD-10-CM | POA: Diagnosis not present

## 2023-08-19 LAB — HEPATIC FUNCTION PANEL
ALT: 21 U/L (ref 0–35)
AST: 22 U/L (ref 0–37)
Albumin: 4.4 g/dL (ref 3.5–5.2)
Alkaline Phosphatase: 106 U/L (ref 39–117)
Bilirubin, Direct: 0.1 mg/dL (ref 0.0–0.3)
Total Bilirubin: 0.4 mg/dL (ref 0.2–1.2)
Total Protein: 6.7 g/dL (ref 6.0–8.3)

## 2023-08-21 ENCOUNTER — Other Ambulatory Visit: Payer: Self-pay

## 2023-08-21 ENCOUNTER — Telehealth: Payer: Self-pay

## 2023-08-21 DIAGNOSIS — R109 Unspecified abdominal pain: Secondary | ICD-10-CM

## 2023-08-21 DIAGNOSIS — R7401 Elevation of levels of liver transaminase levels: Secondary | ICD-10-CM

## 2023-08-21 DIAGNOSIS — K920 Hematemesis: Secondary | ICD-10-CM

## 2023-08-21 DIAGNOSIS — R7989 Other specified abnormal findings of blood chemistry: Secondary | ICD-10-CM

## 2023-08-21 NOTE — Telephone Encounter (Signed)
EUS has been set up for 08/29/23 at 8 am at [facility] with [facility].    She has an appt with Gunnar Fusi tomorrow to discuss the EUS.    Instructions have been entered

## 2023-08-21 NOTE — Progress Notes (Unsigned)
ASSESSMENT    Brief Narrative:  89 y.o.  female known to Dr. Adela Lank  with a past medical history not limited to hypertension, hyperlipidemia, chronic HFpEF, CAD, DM2. Recently admitted with upper abdominal pain, N/V , coffee ground emesis and elevated LFTs.   Recent hospitalization for intermittent epigastric pain / nausea, coffee ground emesis and elevated liver chemistries ( now resolved) Rule out choledocholithiasis / passed stone / SOD?   MRCP shows post-cholecystectomy biliary duct dilation without evidence for bilary obstruction but was degraded study due to motion artifact. LFTs have normalized.   Chronic GERD / medium size hiatal hernia.   Coffee ground emesis resolved. Having breakthrough symptoms on  daily Nexium  since EGD. Last night she added 40 mg of Famotidine at bedtime. Feels better today   See PMH for any additional medical & surgical history   PLAN   --Case has been discussed with patient's primary GI, Dr. Adela Lank as well as Dr. Meridee Score.  Today we discussed proceeding with an EUS and there is an appointment slot available with Dr. Meridee Score on 08/29/2023. Based on findings / clinical course she could also need an ERCP. However, the available time slot does not allow for both procedures so patient would need to return at a later date for an ERCP. The other option is to book for double procedures to be done at a later date though the wait may be quite long.  Alternatively patient could be referred to tertiary care center. Patient and family would like to proceed with the EUS. The benefits and risks of EUS not limited to cardiopulmonary complications of sedation, bleeding, infection,  and perforation were discussed with the patient and family and patient has agreed to proceed.   --Discussed anti-reflux measures such as avoidance of late meals / bedtime snacks, HOB elevation (or use of wedge pillow) and avoidance of trigger foods and caffeine.  -- Continue Nexium  daily before breakfast.  For now continue famotidine 40 mg at bedtime   HPI   Chief complaint : Follow-up  Patient was hospitalized earlier this month with several month history of intermittent postprandial epigastric pain, nausea and vomiting.  Her LFTs were elevated.  She had had an episode of coffee-ground emesis.  There was also reports of black stool. On admission her WBC and hemoglobin were both normal .  Hemoglobin was 13.4, above her baseline.  AST was 251, ALT 106, alk phos 148, normal total bilirubin.  Lipase was 70.  CT angio was negative for any acute findings a large hiatal hernia was seen, RUQ ultrasound showed minimal central intrahepatic biliary duct dilation.  MRI/MRCP  was a degraded study  due to breath motion but showed postoperative biliary dilation. No stones or other obstruction identified to the ampulla.  Moderate hiatal hernia with intrathoracic position of the gastric fundus.  Inpatient EGD showed a medium size hiatal hernia but no source of GI bleed.   Hemoglobin remained in the normal range throughout the admission . LFTs improved. We were suspicious of choledocholithiasis / passed stone. Unfortunately we did not have EUS available for further evaluation of the CBD.  Discharge home once symptoms improved. Given tramadol to take if she had future episodes of epigastric pain.  Outpatient LFTs on 08/19/2023 were normal, she was given a follow-up appointment with me  to discuss EUS  GI History / Pertinent GI Studies   **All endoscopic studies may not be included here    EGD 08/11/23 for coffee ground emesis /  black stools --Normal esophagus.  Normal mucosa found in the gastric body, antrum, and the cardia and in the gastric fundus . Medium size hiatal hernia with food debris and minimal erythema but no ulcerations or stigmata of recent bleeding.  Normal duodenal bulb and second portion of the duodenum.  Biopsied.  No obvious source of recent bleeding identified on EGD.         Latest Ref Rng & Units 08/19/2023   12:12 PM 08/12/2023    5:16 AM 08/11/2023    7:00 AM  Hepatic Function  Total Protein 6.0 - 8.3 g/dL 6.7  5.5  5.6   Albumin 3.5 - 5.2 g/dL 4.4  3.3  3.4   AST 0 - 37 U/L 22  22  31    ALT 0 - 35 U/L 21  47  65   Alk Phosphatase 39 - 117 U/L 106  103  117   Total Bilirubin 0.2 - 1.2 mg/dL 0.4  0.3  0.5   Bilirubin, Direct 0.0 - 0.3 mg/dL 0.1          Latest Ref Rng & Units 08/12/2023    5:16 AM 08/11/2023    7:00 AM 08/10/2023    5:39 AM  CBC  WBC 4.0 - 10.5 K/uL 4.5  4.5  4.8   Hemoglobin 12.0 - 15.0 g/dL 29.5  28.4  13.2   Hematocrit 36.0 - 46.0 % 37.4  39.1  38.3   Platelets 150 - 400 K/uL 133  146  134      Past Medical History:  Diagnosis Date   Aortic atherosclerosis (HCC) 10/12/2021   Cancer (HCC)    Uterine   Cataract    Chronic heart failure with preserved ejection fraction (HFpEF) (HCC)    Chronic stable angina (HCC)    Coronary artery disease cardiologist--- dr Excell Seltzer   Diabetes mellitus type 2, diet-controlled (HCC)    followed by pcp  (10-19-2020  pt stated checks daily in am,  fasting blood surgar--- 115--120s)   DOE (dyspnea on exertion)    per pt "when I over do",  ok with household chores   Echocardiogram 08/2020    Echocardiogram 2/22: EF 55-60, no RWMA, mild LVH, Gr 2 DD, GLS-21.7%, normal RVSF, trivial MR, RVSP 39.5   Edema of right lower extremity    GERD (gastroesophageal reflux disease)    Hiatal hernia    recurrence,  hx HH repair 1989   History of cervical cancer    s/p  vaginal hysterectomy   History of DVT of lower extremity 2016   11-29-2014 post op right TKA of right lower extremity and completed xarelto    History of esophageal stricture    hx s/p dilatation's   History of gastric ulcer 2005 approx.   History of palpitations 2010   event monitor 07-07-2009 showed NSR w/ freq. SVT ectopies with short runs, rare PVCs   History of TIA (transient ischemic attack) 06/1999   12-15-2019  per pt had  several TIA between 12/ 2000 to 02/ 2001 , was sent to specialist @[facility] , had test that was normal (10-19-2020 pt stated no TIAs since ) but has residual of essential tremors of right arm/ hand   Hypertension    Intermittent palpitations    IT band syndrome    Migraine    "ice pick headche lasts about 30 seconds"   Mixed hyperlipidemia    Mixed incontinence urge and stress    Multiple thyroid nodules    followed by pcp---  ultrasound 11-22-2014 no bx   (12-15-2019 per pt had a endocrinologist and was told did not need bx)   OA (osteoarthritis)    knees, elbow, hip, ankles   Occasional tremors    right arm/ hand  s/p TIA residual 2000   Osteoporosis    taking vitamin d   Peroneal DVT (deep venous thrombosis) (HCC) 12/22/2014   Right bundle branch block (RBBB) with left anterior fascicular block (LAFB)    RLS (restless legs syndrome)    S/P drug eluting coronary stent placement 2006   03-16-2005  PCI x1 DES to LCx;   06-27-2005  PCI x1 DES to RCA   Stroke [facility])    TIA's   Urinary retention    post op sling prodecure on 10-27-2020, has foley cathether    Past Surgical History:  Procedure Laterality Date   ANTERIOR AND POSTERIOR REPAIR N/A 12/22/2019   Procedure: ANTERIOR (CYSTOCELE)  REPAIR;  Surgeon: Sherian Rein, MD;  Location: [facility];  Service: Gynecology;  Laterality: N/A;   ANTERIOR AND POSTERIOR REPAIR WITH SACROSPINOUS FIXATION N/A 10/27/2020   Procedure: SACROSPINOUS LIGAMENT FIXATION;  Surgeon: Marguerita Beards, MD;  Location[facility];  Service: Gynecology;  Laterality: N/A;   BIOPSY  08/12/2023   Procedure: BIOPSY;  Surgeon: Ottie Glazier, MD;  Location: [facility];  Service: Gastroenterology;;   BLADDER SUSPENSION N/A 10/27/2020   Procedure: TRANSVAGINAL TAPE (TVT) PROCEDURE;  Surgeon: Marguerita Beards, MD;  Location: [facility];  Service: Gynecology;  Laterality: N/A;   CATARACT EXTRACTION W/  INTRAOCULAR LENS  IMPLANT, BILATERAL  2015   CHOLECYSTECTOMY N/A 01/25/2021   Procedure: LAPAROSCOPIC CHOLECYSTECTOMY WITH INTRAOPERATIVE CHOLANGIOGRAM AND LYSIS OF ADHESIONS;  Surgeon: Karie Soda, MD;  Location: [facility];  Service: General;  Laterality: N/A;   COLONOSCOPY  last one ?   CORONARY ANGIOPLASTY WITH STENT PLACEMENT  03-16-2005   dr Riley Kill   PCI and DES x1 to LCx   CORONARY ANGIOPLASTY WITH STENT PLACEMENT  06-27-2005  dr Riley Kill   PCI and DES x1 to RCA with residual disease LAD 70-80% to manage medically   CYSTOSCOPY N/A 10/27/2020   Procedure: CYSTOSCOPY;  Surgeon: Marguerita Beards, MD;  Location: [facility];  Service: Gynecology;  Laterality: N/A;   CYSTOSCOPY N/A 11/09/2020   Procedure: CYSTOSCOPY;  Surgeon: Marguerita Beards, MD;  Location: [facility];  Service: Gynecology;  Laterality: N/A;   ESOPHAGOGASTRODUODENOSCOPY (EGD) WITH PROPOFOL N/A 08/12/2023   Procedure: ESOPHAGOGASTRODUODENOSCOPY (EGD) WITH PROPOFOL;  Surgeon: Ottie Glazier, MD;  Location: [facility] ENDOSCOPY;  Service: Gastroenterology;  Laterality: N/A;   EYE SURGERY     FOOT SURGERY Left 1990s   left foot stress fracture repair, per pt no hardware   FRACTURE SURGERY     HERNIA REPAIR     HIATAL HERNIA REPAIR  1989   INCISIONAL HERNIA REPAIR N/A 01/25/2021   Procedure: PRIMARY REPAIR OF INCISIONAL HERNIA;  Surgeon: Karie Soda, MD;  Location: [facility];  Service: General;  Laterality: N/A;   INGUINAL HERNIA REPAIR Left 04/17/2021   Procedure: LAPAROSCOPIC LEFT INGUINAL HERNIA REPAIR WITH MESH;  Surgeon: Axel Filler, MD;  Location: [facility];  Service: General;  Laterality: Left;   JOINT REPLACEMENT     KNEE ARTHROSCOPY Bilateral right ?/   left x2 , [facility]12-2009 [facility]   LYSIS OF ADHESION N/A 02/11/2023   Procedure: LYSIS OF ADHESION;  Surgeon: Axel Filler, MD;  Location: [facility];  Service: General;  Laterality: N/A;   PUBOVAGINAL SLING N/A 11/09/2020    Procedure: REVISION OF PUBO-VAGINAL SLING;  Surgeon: Marguerita Beards, MD;  Location: [facility];  Service: Gynecology;  Laterality: N/A;   RECTOCELE REPAIR N/A 04/20/2020   Procedure: POSTERIOR REPAIR (RECTOCELE);  Surgeon: Sherian Rein, MD;  Location: [facility];  Service: Gynecology;  Laterality: N/A;   RECTOCELE REPAIR N/A 10/27/2020   Procedure: POSTERIOR REPAIR (RECTOCELE);  Surgeon: Marguerita Beards, MD;  Location[facility];  Service: Gynecology;  Laterality: N/A;  total time requested for all procedures is 2 hours   SMALL INTESTINE SURGERY     TOTAL KNEE ARTHROPLASTY  11/12/2011   Procedure: TOTAL KNEE ARTHROPLASTY;  Surgeon: Nilda Simmer, MD;  Location: [facility];  Service: Orthopedics;  Laterality: Left;  Dr Thurston Hole wants 90 minutes for this case   TOTAL KNEE ARTHROPLASTY Right 11/29/2014   Procedure: RIGHT TOTAL KNEE ARTHROPLASTY;  Surgeon: Dannielle Huh, MD;  Location: [facility];  Service: Orthopedics;  Laterality: Right;   UPPER GASTROINTESTINAL ENDOSCOPY  last one 04-25-2017   with dilatation esophageal stricture and savary dilatation   VAGINAL HYSTERECTOMY  1988    no ovaries removed for bleeeding   VENTRAL HERNIA REPAIR N/A 02/11/2023   Procedure: LAPAROSCOPIC ATTEMPTED OPEN VENTRAL HERNIA REPAIR WITH MESH;  Surgeon: Axel Filler, MD;  Location: [facility];  Service: General;  Laterality: N/A;  2.5 HRS    Family History  Problem Relation Age of Onset   Stroke Father        family hx of M 1st degree relative <50   Heart disease Father    Coronary artery disease Mother    Heart disease Mother    Arthritis Mother    Hearing loss Mother    Vision loss Mother    Depression Brother    Cancer Brother    Stroke Brother    Diabetes Brother    Cancer Brother        bladder with mets   Diabetes Daughter        borderline   Hypertension Daughter    Arthritis Other        family hx of   Hypertension Other         family hx of   Other Other        family hx of cardiovascular disorder   Thyroid disease Daughter    Early death Daughter    Breast cancer Neg Hx    Colon cancer Neg Hx    Anesthesia problems Neg Hx    Hypotension Neg Hx    Malignant hyperthermia Neg Hx    Pseudochol deficiency Neg Hx    Colon polyps Neg Hx    Esophageal cancer Neg Hx    Rectal cancer Neg Hx    Stomach cancer Neg Hx     Current Medications, Allergies, Family History and Social History were reviewed in [employer] electronic medical record.     Current Outpatient Medications  Medication Sig Dispense Refill   acetaminophen (TYLENOL) 500 MG tablet Take 1,000 mg by mouth every 6 (six) hours as needed for moderate pain.     amLODipine (NORVASC) 10 MG tablet TAKE 1 TABLET EVERY DAY 90 tablet 3   amoxicillin (AMOXIL) 500 MG capsule Take 500 mg by mouth 3 (three) times daily. Use before going to the dentist     aspirin EC 81 MG tablet Take 1 tablet (81 mg total) by mouth daily.  Blood Glucose Monitoring Suppl (TRUE METRIX AIR GLUCOSE METER) w/Device KIT USE TO CHECK BLOOD SUGAR ONCE DAILY AND AS NEEDED.  DX CODE E11.9 1 kit 0   diclofenac Sodium (VOLTAREN) 1 % GEL Apply 1 application topically 4 (four) times daily as needed (hip pain).     estradiol (ESTRACE) 0.1 MG/GM vaginal cream Place 0.5 g vaginally 2 (two) times a week. Place 0.5g nightly for two weeks then twice a week after 30 g 11   furosemide (LASIX) 20 MG tablet Take 20 mg by mouth as needed for fluid.     glucose blood (TRUE METRIX BLOOD GLUCOSE TEST) test strip TEST BLOOD SUGAR EVERY DAY  OR AS NEEDED 200 strip 12   Glycerin, PF, (OPTASE COMFORT DRY EYE) 1 % SOLN Place 1 drop into both eyes in the morning and at bedtime.     isosorbide mononitrate (IMDUR) 60 MG 24 hr tablet TAKE 1 AND 1/2 TABLETS EVERY DAY 135 tablet 3   losartan (COZAAR) 100 MG tablet TAKE 1 TABLET EVERY DAY (Patient taking differently: Take 50 mg by mouth 2 (two) times daily.) 90 tablet  1   Magnesium Oxide 250 MG TABS Take 250 mg by mouth every morning.     Multiple Vitamins-Minerals (ZINC PO) Take 50 mg by mouth daily. with Vitamin D 2000IU capsule daily     Olopatadine HCl (PATADAY) 0.2 % SOLN Place 1 drop into both eyes daily.     omeprazole (PRILOSEC) 20 MG capsule TAKE 1 CAPSULE EVERY DAY AS NEEDED (NEED OFFICE VISIT BEFORE ANY FURTHER REFILLS) (Patient taking differently: Take 20 mg by mouth every morning.) 90 capsule 3   pramipexole (MIRAPEX) 0.25 MG tablet TAKE 1 TABLET TWICE DAILY 180 tablet 3   simvastatin (ZOCOR) 20 MG tablet TAKE 1 TABLET AT BEDTIME 90 tablet 1   TRUEplus Lancets 33G MISC USE TO CHECK BLOOD SUGAR ONCE DAILY AND AS NEEDED.  DX CODE: E11.9 200 each 1   Vibegron 75 MG TABS Take 1 tablet (75 mg total) by mouth daily. (Patient taking differently: Take 1 tablet by mouth daily as needed (Urinary Issues).) 30 tablet 11   No current facility-administered medications for this visit.    Review of Systems: No chest pain. No shortness of breath. No urinary complaints.    Physical Exam  Filed Weights   08/22/23 1335  Weight: 142 lb (64.4 kg)   Wt Readings from Last 3 Encounters:  08/09/23 135 lb 12.9 oz (61.6 kg)  08/05/23 142 lb 4 oz (64.5 kg)  04/02/23 146 lb 3.2 oz (66.3 kg)   BP 122/62, heart rate 77  Constitutional:  Pleasant, generally well appearing female in no acute distress. Psychiatric: Normal mood and affect. Behavior is normal. EENT: Pupils normal.  Conjunctivae are normal. No scleral icterus. Neck supple.  Cardiovascular: Normal rate, regular rhythm.  Pulmonary/chest: Effort normal and breath sounds normal. No wheezing, rales or rhonchi. Abdominal: Soft, nondistended, nontender. Bowel sounds active throughout. There are no masses palpable. No hepatomegaly. Neurological: Alert and oriented to person place and time.    Willette Cluster, NP  08/21/2023, 10:57 AM  Cc:  Bradd Canary, MD

## 2023-08-21 NOTE — Telephone Encounter (Signed)
-----   Message from [facility] sent at 08/21/2023  4:23 PM EST ----- Regarding: RE: Coordinating care plan Can she come tomorrow for EUS? I won't do ERCP tomorrow however. GM ----- Message ----- From: Benancio Deeds, MD Sent: 08/21/2023   4:09 PM EST To: Meredith Pel, NP; # Subject: RE: Coordinating care plan                     Aurther Loft, unless GM has an opening relatively soon I'd refer her out to tertiary care center [place], [place], or [facility] for EUS.  [facility] - nonurgent - not sure when your next EUS opening is, and if you can accommodate this patient or any new cases anytime soon or if we should defer out? Not sure if you have had cancellations arise or not.   Thanks, Brett Canales ----- Message ----- From: Meredith Pel, NP Sent: 08/21/2023  11:13 AM EST To: Benancio Deeds, MD Subject: RE: Coordinating care plan                     Hello Brett Canales,  This patient is coming in to see me tomorrow.  I read her recent hospital notes, looks like we are trying to refer her somewhere for an EUS to look for CBD stones  The MRCP was degraded study but clinically we suspicious of choledocholithiasis or maybe passed stone.   My understanding is the family is quite anxious.  I know Harriett Sine reached out to you by staff message but I don't know what you decided about referral for EUS. Thanks ----- Message ----- From: Ottie Glazier, MD Sent: 08/12/2023   1:21 PM EST To: Meredith Pel, NP; Benancio Deeds, MD; # Subject: Coordinating care plan                         Heather [name]-  I am touching base regarding one of your patients who was admitted to [facility] for abdominal pain, vomiting and coffee-ground emesis -Heather [name].  She presented to [facility] late last week with crescendoing episodes of epigastric abdominal pain.  This was previously felt to potentially be due to a hernia which she had repaired but the episodes have persisted. Had 1 episode of coffee-ground emesis  with her recent admission.  Labs were noteworthy for elevated transaminases -AST 251, ALT 106, alk phos 148 on admission.  She is status post chole in 2022 and elevated LFTs raised concern for possible retained stone or SOD.  MRCP did not show any filling defects.  I did an EGD which was unremarkable.  She has been without pain since hospital admission.  Sherilyn Cooter had discussed with the family seeing if we could connect her with a biliary endoscopist for EUS +/- ERCP to evaluate for retained stone or consider if she may need a sphincterotomy if SOD is present.  Her daughter did mention that nitrates administered in the ambulance by EMS helped her pain.  With [facility] being out right now, Sherilyn Cooter suggested seeing if you had a trusted biliary colleague at one of the academic institutions to reach out to to place a referral.  Family has been anxious about not having a definitive answer for her symptoms and are worried another episode will occur in the near future.  Let me know if you have any questions.  Best,  Harriett Sine

## 2023-08-22 ENCOUNTER — Ambulatory Visit: Payer: Medicare HMO | Admitting: Nurse Practitioner

## 2023-08-22 ENCOUNTER — Encounter: Payer: Self-pay | Admitting: Nurse Practitioner

## 2023-08-22 ENCOUNTER — Encounter (HOSPITAL_COMMUNITY): Payer: Self-pay | Admitting: Gastroenterology

## 2023-08-22 VITALS — BP 122/62 | HR 77 | Ht 62.0 in | Wt 142.0 lb

## 2023-08-22 DIAGNOSIS — R1013 Epigastric pain: Secondary | ICD-10-CM | POA: Diagnosis not present

## 2023-08-22 DIAGNOSIS — K449 Diaphragmatic hernia without obstruction or gangrene: Secondary | ICD-10-CM

## 2023-08-22 DIAGNOSIS — K219 Gastro-esophageal reflux disease without esophagitis: Secondary | ICD-10-CM | POA: Diagnosis not present

## 2023-08-22 DIAGNOSIS — R7989 Other specified abnormal findings of blood chemistry: Secondary | ICD-10-CM

## 2023-08-22 MED ORDER — ONDANSETRON HCL 4 MG PO TABS: 4.0000 mg | ORAL_TABLET | Freq: Three times a day (TID) | ORAL | 0 refills | Status: DC | PRN

## 2023-08-22 NOTE — Progress Notes (Signed)
Agree with assessment and plan as outlined.  Happy to hear that there was a slot to do EUS with Dr. Meridee Score and we will see what that shows.

## 2023-08-22 NOTE — Patient Instructions (Addendum)
You have been scheduled for an EUS. Please follow written instructions given to you at your visit today.  If you use inhalers (even only as needed), please bring them with you on the day of your procedure.   ___________________________________________________________________________   We have sent the following medications to your pharmacy for you to pick up at your convenience: Generic Zofran  I appreciate the opportunity to care for you. Willette Cluster, NP

## 2023-08-23 NOTE — Progress Notes (Signed)
Pre op call eval Name:  Heather [name]  PCP-Stacey Abner Greenspan MD Cardiologist-Dr Excell Seltzer  EKG-08/09/23 Echo-01/29/21 Stress Test-08/31/20 Cath-n/a Blood thinner-n/a GLP-1-n/a   Hx: CAD, HTN, Stroke, RBBB, Aortic Atherosclerosis, DVT, Uterine Cancer, DM. Last saw cardiology 03/04/23 says f/u 6 months. Patient endorses no new cardiac or breathing issues. Had EGD 08/11/23 without complications. Is using boot for left foot d/t pain from plantar fascitis, uses walker for long distance.  Anesthesia Review: Yes

## 2023-08-23 NOTE — Progress Notes (Signed)
Available to do EUS to rule out microcholedocholithiasis and ampullary lesions.  Would not plan for ERCP currently, will for functional disorder of the sphincter.

## 2023-08-26 ENCOUNTER — Telehealth: Payer: Self-pay | Admitting: Gastroenterology

## 2023-08-26 NOTE — Telephone Encounter (Signed)
The pt has been moved to 3/27 at 330 pm due to the weather.  She has been re instructed and all questions answered

## 2023-08-26 NOTE — Telephone Encounter (Signed)
Inbound call from patient, would like to reschedule procedure on 2/20 at [facility] due to the weather, Patient states she no longer has transporation.

## 2023-08-29 ENCOUNTER — Ambulatory Visit: Payer: Medicare HMO | Admitting: Physician Assistant

## 2023-08-29 ENCOUNTER — Ambulatory Visit: Payer: Self-pay | Admitting: Family Medicine

## 2023-08-29 ENCOUNTER — Encounter: Payer: Self-pay | Admitting: Physician Assistant

## 2023-08-29 VITALS — BP 184/75 | HR 73 | Temp 97.8°F | Ht 62.0 in | Wt 145.4 lb

## 2023-08-29 DIAGNOSIS — R300 Dysuria: Secondary | ICD-10-CM

## 2023-08-29 DIAGNOSIS — N3001 Acute cystitis with hematuria: Secondary | ICD-10-CM

## 2023-08-29 LAB — POC URINALSYSI DIPSTICK (AUTOMATED)
Glucose, UA: NEGATIVE
Nitrite, UA: NEGATIVE
Protein, UA: POSITIVE — AB
Spec Grav, UA: 1.015 (ref 1.010–1.025)
Urobilinogen, UA: 0.2 U/dL
pH, UA: 6 (ref 5.0–8.0)

## 2023-08-29 MED ORDER — CEPHALEXIN 500 MG PO CAPS: 500.0000 mg | ORAL_CAPSULE | Freq: Two times a day (BID) | ORAL | 0 refills | Status: AC

## 2023-08-29 NOTE — Telephone Encounter (Signed)
Chief Complaint: Urinary symptoms Symptoms: Burning, frequency with urination, urinary retention Frequency: Ongoing Pertinent Negatives: Patient denies blood in urine, fever, flank pain Disposition: [] ED /[] Urgent Care (no appt availability in office) / [x] Appointment(In office/virtual)/ []  [facility]/ [] Home Care/ [] Refused Recommended Disposition /[] [facility]/ []  Follow-up with PCP Additional Notes: Pt states the urinary symptoms started yesterday morning. Pt scheduled for an appointment today. This RN educated pt on home care, new-worsening symptoms, when to call back/seek emergent care. Pt verbalized understanding and agrees to plan.   Copied from CRM [id number]. Topic: Clinical - Red Word Triage >> Aug 29, 2023  1:33 PM Alcus Dad H wrote: Red Word that prompted transfer to Nurse Triage: burning when urinating, very painful, urgency to use the restroom but there is very little that comes out. Second day Reason for Disposition  Urinating more frequently than usual (i.e., frequency)  Answer Assessment - Initial Assessment Questions 1. SYMPTOM: "What's the main symptom you're concerned about?" (e.g., frequency, incontinence)     Burning with urination 2. ONSET: "When did the  pain  start?"     Yesterday morning 3. PAIN: "Is there any pain?" If Yes, ask: "How bad is it?" (Scale: 1-10; mild, moderate, severe)     10 when urinating 4. CAUSE: "What do you think is causing the symptoms?"     Urinary tract infection 5. OTHER SYMPTOMS: "Do you have any other symptoms?" (e.g., blood in urine, fever, flank pain, pain with urination)     Pain with urination  Protocols used: Urinary Symptoms-A-AH

## 2023-08-29 NOTE — Progress Notes (Signed)
Established patient visit   Patient: Heather [name]   DOB: 1935/05/23   89 y.o. Female  MRN: [id number] Visit Date: 08/29/2023  Today's healthcare provider: Alfredia Ferguson, PA-C   Chief Complaint  Patient presents with   Urinary Tract Infection    Present for 2 days. Burning, painful and urgency.    Subjective     Pt reports dysuria, urinary urgency x 2 days. Denies hematuria, back pain, abdominal pain. Reports some nausea.  Medications: Outpatient Medications Prior to Visit  Medication Sig   acetaminophen (TYLENOL) 500 MG tablet Take 1,000 mg by mouth every 6 (six) hours as needed for moderate pain.   amLODipine (NORVASC) 10 MG tablet TAKE 1 TABLET EVERY DAY   aspirin EC 81 MG tablet Take 1 tablet (81 mg total) by mouth daily.   Blood Glucose Monitoring Suppl (TRUE METRIX AIR GLUCOSE METER) w/Device KIT USE TO CHECK BLOOD SUGAR ONCE DAILY AND AS NEEDED.  DX CODE E11.9   diclofenac Sodium (VOLTAREN) 1 % GEL Apply 1 application topically 4 (four) times daily as needed (hip pain).   estradiol (ESTRACE) 0.1 MG/GM vaginal cream Place 0.5 g vaginally 2 (two) times a week. Place 0.5g nightly for two weeks then twice a week after   furosemide (LASIX) 20 MG tablet Take 20 mg by mouth as needed for fluid.   glucose blood (TRUE METRIX BLOOD GLUCOSE TEST) test strip TEST BLOOD SUGAR EVERY DAY  OR AS NEEDED   Glycerin, PF, (OPTASE COMFORT DRY EYE) 1 % SOLN Place 1 drop into both eyes in the morning and at bedtime.   isosorbide mononitrate (IMDUR) 60 MG 24 hr tablet TAKE 1 AND 1/2 TABLETS EVERY DAY   losartan (COZAAR) 100 MG tablet TAKE 1 TABLET EVERY DAY (Patient taking differently: Take 50 mg by mouth 2 (two) times daily.)   Magnesium Oxide 250 MG TABS Take 250 mg by mouth every morning.   Multiple Vitamins-Minerals (ZINC PO) Take 50 mg by mouth daily. with Vitamin D 2000IU capsule daily   Olopatadine HCl (PATADAY) 0.2 % SOLN Place 1 drop into both eyes daily.   omeprazole (PRILOSEC)  20 MG capsule TAKE 1 CAPSULE EVERY DAY AS NEEDED (NEED OFFICE VISIT BEFORE ANY FURTHER REFILLS) (Patient taking differently: Take 20 mg by mouth every morning.)   ondansetron (ZOFRAN) 4 MG tablet Take 1 tablet (4 mg total) by mouth every 8 (eight) hours as needed for nausea or vomiting.   pramipexole (MIRAPEX) 0.25 MG tablet TAKE 1 TABLET TWICE DAILY   simvastatin (ZOCOR) 20 MG tablet TAKE 1 TABLET AT BEDTIME   TRUEplus Lancets 33G MISC USE TO CHECK BLOOD SUGAR ONCE DAILY AND AS NEEDED.  DX CODE: E11.9   Vibegron 75 MG TABS Take 1 tablet (75 mg total) by mouth daily. (Patient taking differently: Take 1 tablet by mouth daily as needed (Urinary Issues).)   [DISCONTINUED] amoxicillin (AMOXIL) 500 MG capsule Take 500 mg by mouth 3 (three) times daily. Use before going to the dentist   No facility-administered medications prior to visit.    Review of Systems  Constitutional:  Negative for fatigue and fever.  Respiratory:  Negative for cough and shortness of breath.   Cardiovascular:  Negative for chest pain and leg swelling.  Gastrointestinal:  Negative for abdominal pain.  Genitourinary:  Positive for dysuria and urgency.  Neurological:  Negative for dizziness and headaches.       Objective    BP (!) 184/75   Pulse 73  Temp 97.8 F (36.6 C) (Oral)   Ht 5\' 2"  (1.575 m)   Wt 145 lb 6 oz (65.9 kg)   LMP  (LMP Unknown)   SpO2 95%   BMI 26.59 kg/m    Physical Exam Constitutional:      General: She is awake.     Appearance: She is well-developed.  HENT:     Head: Normocephalic.  Eyes:     Conjunctiva/sclera: Conjunctivae normal.  Cardiovascular:     Rate and Rhythm: Normal rate and regular rhythm.     Heart sounds: Normal heart sounds.  Pulmonary:     Effort: Pulmonary effort is normal.     Breath sounds: Normal breath sounds.  Abdominal:     Tenderness: There is no abdominal tenderness. There is no right CVA tenderness or left CVA tenderness.  Skin:    General: Skin is  warm.  Neurological:     Mental Status: She is alert and oriented to person, place, and time.  Psychiatric:        Attention and Perception: Attention normal.        Mood and Affect: Mood normal.        Speech: Speech normal.        Behavior: Behavior is cooperative.      Results for orders placed or performed in visit on 08/29/23  POCT Urinalysis Dipstick (Automated)  Result Value Ref Range   Color, UA Yellow    Clarity, UA Cloudy    Glucose, UA Negative Negative   Bilirubin, UA Small    Ketones, UA Trace    Spec Grav, UA 1.015 1.010 - 1.025   Blood, UA Large    pH, UA 6.0 5.0 - 8.0   Protein, UA Positive (A) Negative   Urobilinogen, UA 0.2 0.2 or 1.0 E.U./dL   Nitrite, UA Negative    Leukocytes, UA Large (3+) (A) Negative    Assessment & Plan    Dysuria -     POCT Urinalysis Dipstick (Automated)  Acute cystitis with hematuria -     Cephalexin; Take 1 capsule (500 mg total) by mouth 2 (two) times daily for 5 days.  Dispense: 10 capsule; Refill: 0 -     Urine Culture   Rx keflex 500 mg bid x 5 days Ordered culture Increase fluids  Return if symptoms worsen or fail to improve.       Alfredia Ferguson, PA-C  [facility] [phone] (phone) [phone] (fax)  [facility]

## 2023-08-30 LAB — URINE CULTURE
MICRO NUMBER:: 16108690
SPECIMEN QUALITY:: ADEQUATE

## 2023-09-03 ENCOUNTER — Encounter: Payer: Self-pay | Admitting: Physician Assistant

## 2023-09-04 ENCOUNTER — Ambulatory Visit: Payer: Self-pay | Admitting: Licensed Clinical Social Worker

## 2023-09-04 NOTE — Patient Outreach (Signed)
 Care Coordination   Follow Up Visit Note   09/04/2023 Name: Heather [name] MRN: [id number] DOB: 07/18/1934  Heather [name] is a 89 y.o. year old female who sees Heather Canary, MD for primary care. I spoke with  Heather [name] by phone today.  What matters to the patients health and wellness today? patient has pain in left foot. she is wearing a boot at present on left foot. she has walking challenges     Goals Addressed             This Visit's Progress    patient has pain in left foot. she is wearing a boot at present on left foot. she has walking challenges       Interventions: Spoke with client via phone today about client needs Heather [name] said she has pain in left foot. She is wearing a boot on left foot at present. She has some walking challenges Client said she has appointment tomorrow with medical provider to review recent X-Ray of her left foot.  She said she is trying to determine with medical provider what is best course of care for her left foot. She has had surgery on her left foot in the past Regarding transport needs, she drives herself on short trips or errands Reviewed family support. She has support from her son and from her daughter Regarding meal provision, she has adequate food supply and has no problem in obtaining her meals needed Discussed mood of client. Heather [name] said she thought her mood was stable. She did not mention any mood issues at present Discussed skin care needs. Heather [name] said she had [facility]. Discussed edema issues. Heather [name] said she has some swelling occasionally in her right ankle Heather [name] for phone call with LCSW today Encouraged Heather [name] to call LCSW as needed for SW support at [phone] Client was appreciative of call from LCSW today.          SDOH assessments and interventions completed:  Yes  SDOH Interventions Today    Flowsheet Row Most Recent Value   SDOH Interventions   Depression Interventions/Treatment  Counseling  Physical Activity Interventions Other (Comments)  [has pain in left foot. Wears a boot at present on her left foot . Walking challenges]  Stress Interventions Provide Counseling  [has stress in managing pain issues. has stress in managing medical needs]        Care Coordination Interventions:  Yes, provided   Interventions Today    Flowsheet Row Most Recent Value  Chronic Disease   Chronic disease during today's visit Other  [spoke with client about client needs.]  General Interventions   General Interventions Discussed/Reviewed General Interventions Discussed, Community Resources  Education Interventions   Education Provided Provided Education  Provided Verbal Education On [employer]  Mental Health Interventions   Mental Health Discussed/Reviewed Coping Strategies  [trying to use coping skills to manage stress issues faced]  Nutrition Interventions   Nutrition Discussed/Reviewed Nutrition Discussed  Pharmacy Interventions   Pharmacy Dicussed/Reviewed Pharmacy Topics Discussed  Safety Interventions   Safety Discussed/Reviewed Fall Risk        Follow up plan: Follow up call scheduled for 11/11/23 at 3:00 PM     Encounter Outcome:  Patient Visit Completed    Lorna Few  MSW, LCSW [place] Licensed Clinical Social Worker Direct Dial:  [phone] Fax:  [phone] Website:  [email or web address]

## 2023-09-04 NOTE — Patient Instructions (Signed)
 Visit Information  Thank you for taking time to visit with me today. Please don't hesitate to contact me if I can be of assistance to you.   Following are the goals we discussed today:   Goals Addressed             This Visit's Progress    patient has pain in left foot. she is wearing a boot at present on left foot. she has walking challenges       Interventions: Spoke with client via phone today about client needs Rosena said she has pain in left foot. She is wearing a boot on left foot at present. She has some walking challenges Client said she has appointment tomorrow with medical provider to review recent X-Ray of her left foot.  She said she is trying to determine with medical provider what is best course of care for her left foot. She has had surgery on her left foot in the past Regarding transport needs, she drives herself on short trips or errands Reviewed family support. She has support from her son and from her daughter Regarding meal provision, she has adequate food supply and has no problem in obtaining her meals needed Discussed mood of client. Kimbella said she thought her mood was stable. She did not mention any mood issues at present Discussed skin care needs. Cady said she had received [facility] as [facility]. She was [facility] from [facility]. Discussed edema issues. Shaylynne said she has some swelling occasionally in her right ankle [name] for phone call with LCSW today Encouraged Austina Constantin to call LCSW as needed for SW support at [phone] Client was appreciative of call from LCSW today.          Our next appointment is by telephone on 11/11/23 at 3:00 PM   Please call the care guide team at [phone] if you need to cancel or reschedule your appointment.   If you are experiencing a Mental Health or Behavioral Health Crisis or need someone to talk to, please go to [facility] [address]  [place] [phone])   The patient verbalized understanding of instructions, educational materials, and care plan provided today and DECLINED offer to receive copy of patient instructions, educational materials, and care plan.   The patient has been provided with contact information for the care management team and has been advised to call with any health related questions or concerns.    Lorna Few  MSW, LCSW [place] Licensed Clinical Social Worker Direct Dial:  [phone] Fax:  [phone] Website:  [email or web address]

## 2023-09-05 DIAGNOSIS — M19072 Primary osteoarthritis, left ankle and foot: Secondary | ICD-10-CM | POA: Diagnosis not present

## 2023-09-16 ENCOUNTER — Encounter: Payer: Self-pay | Admitting: Cardiovascular Disease

## 2023-09-16 ENCOUNTER — Ambulatory Visit: Payer: Medicare HMO | Attending: Cardiovascular Disease | Admitting: Cardiovascular Disease

## 2023-09-16 VITALS — BP 116/70 | HR 73 | Ht 62.0 in | Wt 144.0 lb

## 2023-09-16 DIAGNOSIS — I10 Essential (primary) hypertension: Secondary | ICD-10-CM

## 2023-09-16 DIAGNOSIS — I5032 Chronic diastolic (congestive) heart failure: Secondary | ICD-10-CM

## 2023-09-16 DIAGNOSIS — I2510 Atherosclerotic heart disease of native coronary artery without angina pectoris: Secondary | ICD-10-CM | POA: Diagnosis not present

## 2023-09-16 DIAGNOSIS — E782 Mixed hyperlipidemia: Secondary | ICD-10-CM

## 2023-09-16 MED ORDER — AMLODIPINE BESYLATE 5 MG PO TABS: 5.0000 mg | ORAL_TABLET | Freq: Every day | ORAL | 3 refills | Status: AC

## 2023-09-16 NOTE — Assessment & Plan Note (Signed)
 The patient's metoprolol was stopped in the hospital.  Her blood pressure may still be overtreated as she is significantly fatigued and running a relatively low blood pressure today at 116/70 mmHg.  I advised her to reduce amlodipine to 5 mg daily.  She will continue on losartan 100 mg daily.  No other changes are made in her medical regimen today.

## 2023-09-16 NOTE — Patient Instructions (Signed)
 Medication Instructions:  DECREASE Amlodipine to 5mg  daily *If you need a refill on your cardiac medications before your next appointment, please call your pharmacy*  Follow-Up: At [facility], you and your health needs are our priority.  As part of our continuing mission to provide you with exceptional heart care, we have created designated Provider Care Teams.  These Care Teams include your primary Cardiologist (physician) and Advanced Practice Providers (APPs -  Physician Assistants and Nurse Practitioners) who all work together to provide you with the care you need, when you need it.  Your next appointment:   1 year(s)  Provider:   Tonny Bollman, MD      1st Floor: - Lobby - Registration  - Pharmacy  - Lab - Cafe  2nd Floor: - PV Lab - Diagnostic Testing (echo, CT, nuclear med)  3rd Floor: - Vacant  4th Floor: - TCTS (cardiothoracic surgery) - AFib Clinic - Structural Heart Clinic - Vascular Surgery  - Vascular Ultrasound  5th Floor: - HeartCare Cardiology (general and EP) - Clinical Pharmacy for coumadin, hypertension, lipid, weight-loss medications, and med management appointments    Valet parking services will be available as well.

## 2023-09-16 NOTE — Assessment & Plan Note (Signed)
 Treated with simvastatin.  LDL cholesterol 68.

## 2023-09-16 NOTE — Progress Notes (Signed)
 Cardiology Office Note:    Date:  09/16/2023   ID:  Heather [name], DOB 08/06/1934, MRN [id number]  PCP:  Bradd Canary, MD   [facility] Providers Cardiologist:  Tonny Bollman, MD Cardiology APP:  Kennon Rounds     Referring MD: Bradd Canary, MD   Chief Complaint  Patient presents with   Coronary Artery Disease    History of Present Illness:    Heather [name] is a 89 y.o. female with a hx of CAD presenting for follow-up evaluation. 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  The patient is here with her daughter today.  She was hospitalized with abdominal pain in January.  She had coffee-ground emesis and melena but stable hemoglobin.  She is scheduled to have endoscopy with endoscopic ultrasound next month.  She has been doing well and is debating whether to go forward with another procedure because she has not had any recurrent symptoms.  She denies any chest pain, chest pressure, or shortness of breath.  Her last echo study was in 2022 and it showed normal LVEF.  She had high-sensitivity troponins checked during her hospitalization and they were normal at 9 and 7, respectively on serial checks.  The patient complains of swelling in her left lower leg.  She also complains of generalized fatigue.   Current Medications: Current Meds  Medication Sig   acetaminophen (TYLENOL) 500 MG tablet Take 1,000 mg by mouth every 6 (six) hours as needed for moderate pain.   amLODipine (NORVASC) 5 MG tablet Take 1 tablet (5 mg total) by mouth daily.   aspirin EC 81 MG tablet Take 1 tablet (81 mg total) by mouth daily.   Blood Glucose Monitoring Suppl (TRUE METRIX AIR GLUCOSE METER) w/Device KIT USE TO CHECK BLOOD SUGAR ONCE DAILY AND AS NEEDED.  DX CODE E11.9   diclofenac Sodium  (VOLTAREN) 1 % GEL Apply 1 application topically 4 (four) times daily as needed (hip pain).   estradiol (ESTRACE) 0.1 MG/GM vaginal cream Place 0.5 g vaginally 2 (two) times a week. Place 0.5g nightly for two weeks then twice a week after   furosemide (LASIX) 20 MG tablet Take 20 mg by mouth as needed for fluid.   glucose blood (TRUE METRIX BLOOD GLUCOSE TEST) test strip TEST BLOOD SUGAR EVERY DAY  OR AS NEEDED   Glycerin, PF, (OPTASE COMFORT DRY EYE) 1 % SOLN Place 1 drop into both eyes in the morning and at bedtime.   isosorbide mononitrate (IMDUR) 60 MG 24 hr tablet TAKE 1 AND 1/2 TABLETS EVERY DAY   losartan (COZAAR) 100 MG tablet TAKE 1 TABLET EVERY DAY (Patient taking differently: Take 50 mg by mouth 2 (two) times daily.)   Magnesium Oxide 250 MG TABS Take 250 mg by mouth every morning.   Multiple Vitamins-Minerals (ZINC PO) Take 50 mg by mouth daily. with Vitamin D 2000IU capsule daily   Olopatadine HCl (PATADAY) 0.2 % SOLN Place 1 drop into both eyes daily.   omeprazole (PRILOSEC) 20 MG capsule TAKE 1 CAPSULE EVERY DAY AS NEEDED (NEED OFFICE VISIT BEFORE ANY FURTHER REFILLS) (Patient taking differently: Take 20 mg by mouth every morning.)   ondansetron (ZOFRAN) 4 MG tablet Take 1 tablet (4 mg total) by mouth every 8 (eight) hours as  needed for nausea or vomiting.   pramipexole (MIRAPEX) 0.25 MG tablet TAKE 1 TABLET TWICE DAILY   simvastatin (ZOCOR) 20 MG tablet TAKE 1 TABLET AT BEDTIME   TRUEplus Lancets 33G MISC USE TO CHECK BLOOD SUGAR ONCE DAILY AND AS NEEDED.  DX CODE: E11.9   Vibegron 75 MG TABS Take 1 tablet (75 mg total) by mouth daily. (Patient taking differently: Take 1 tablet by mouth daily as needed (Urinary Issues).)   [DISCONTINUED] amLODipine (NORVASC) 10 MG tablet TAKE 1 TABLET EVERY DAY   [DISCONTINUED] fluorometholone (FML) 0.1 % ophthalmic suspension Place into both eyes 2 (two) times daily.     Allergies:   Baclofen, Gabapentin, and Nitrofurantoin   ROS:   Please see  the history of present illness.    All other systems reviewed and are negative.  EKGs/Labs/Other Studies Reviewed:    The following studies were reviewed today: Cardiac Studies & Procedures   ______________________________________________________________________________________________   STRESS TESTS  MYOCARDIAL PERFUSION IMAGING 08/31/2020  Narrative  The left ventricular ejection fraction is hyperdynamic (>65%).  Nuclear stress EF: 67%.  There was no ST segment deviation noted during stress.  The study is normal.  This is a low risk study.  No ischemia or infarction on perfusion images.   ECHOCARDIOGRAM  ECHOCARDIOGRAM COMPLETE 01/29/2021  Narrative ECHOCARDIOGRAM REPORT    Patient Name:   Heather [name] Date of Exam: 01/29/2021 Medical Rec #:  [id number]      Height:       62.0 in Accession #:    [id number]     Weight:       160.9 lb Date of Birth:  1935-04-16      BSA:          1.743 m Patient Age:    86 years       BP:           144/57 mmHg Patient Gender: F              HR:           71 bpm. Exam Location:  Inpatient  Procedure: 2D Echo, Cardiac Doppler and Color Doppler  Indications:    CHF-Acute Diastolic I50.31  History:        Patient has prior history of Echocardiogram examinations, most recent 08/26/2020. CAD, TIA, Arrythmias:RBBB; Risk Factors:Diabetes, Hypertension and Dyslipidemia.  Sonographer:    Eulah Pont RDCS Referring Phys: [phone] JARED M GARDNER   Sonographer Comments: Image acquisition challenging due to respiratory motion. IMPRESSIONS   1. Left ventricular ejection fraction, by estimation, is 60 to 65%. The left ventricle has normal function. The left ventricle has no regional wall motion abnormalities. Left ventricular diastolic parameters are indeterminate. 2. Right ventricular systolic function is normal. The right ventricular size is normal. There is normal pulmonary artery systolic pressure. The estimated right ventricular  systolic pressure is 31.1 mmHg. 3. The mitral valve is normal in structure. Trivial mitral valve regurgitation. No evidence of mitral stenosis. 4. The aortic valve is tricuspid. Aortic valve regurgitation is not visualized. Mild to moderate aortic valve sclerosis/calcification is present, without any evidence of aortic stenosis. 5. The inferior vena cava is normal in size with greater than 50% respiratory variability, suggesting right atrial pressure of 3 mmHg.  FINDINGS Left Ventricle: Left ventricular ejection fraction, by estimation, is 60 to 65%. The left ventricle has normal function. The left ventricle has no regional wall motion abnormalities. The left ventricular internal cavity size was normal in size. There is  no left ventricular hypertrophy. Left ventricular diastolic parameters are indeterminate. Normal left ventricular filling pressure.  Right Ventricle: The right ventricular size is normal. No increase in right ventricular wall thickness. Right ventricular systolic function is normal. There is normal pulmonary artery systolic pressure. The tricuspid regurgitant velocity is 2.65 m/s, and with an assumed right atrial pressure of 3 mmHg, the estimated right ventricular systolic pressure is 31.1 mmHg.  Left Atrium: Left atrial size was normal in size.  Right Atrium: Right atrial size was normal in size.  Pericardium: There is no evidence of pericardial effusion.  Mitral Valve: The mitral valve is normal in structure. Mild mitral annular calcification. Trivial mitral valve regurgitation. No evidence of mitral valve stenosis.  Tricuspid Valve: The tricuspid valve is normal in structure. Tricuspid valve regurgitation is trivial. No evidence of tricuspid stenosis.  Aortic Valve: The aortic valve is tricuspid. Aortic valve regurgitation is not visualized. Mild to moderate aortic valve sclerosis/calcification is present, without any evidence of aortic stenosis.  Pulmonic Valve: The  pulmonic valve was normal in structure. Pulmonic valve regurgitation is not visualized. No evidence of pulmonic stenosis.  Aorta: The aortic root is normal in size and structure.  Venous: The inferior vena cava is normal in size with greater than 50% respiratory variability, suggesting right atrial pressure of 3 mmHg.  IAS/Shunts: No atrial level shunt detected by color flow Doppler.   LEFT VENTRICLE PLAX 2D LVIDd:         3.00 cm  Diastology LVIDs:         2.10 cm  LV e' medial:    6.60 cm/s LV PW:         0.90 cm  LV E/e' medial:  14.8 LV IVS:        1.10 cm  LV e' lateral:   9.00 cm/s LVOT diam:     1.90 cm  LV E/e' lateral: 10.9 LV SV:         67 LV SV Index:   38 LVOT Area:     2.84 cm   RIGHT VENTRICLE RV S prime:     12.70 cm/s TAPSE (M-mode): 2.0 cm  LEFT ATRIUM             Index       RIGHT ATRIUM           Index LA diam:        3.10 cm 1.78 cm/m  RA Area:     11.60 cm LA Vol (A2C):   52.5 ml 30.12 ml/m RA Volume:   24.70 ml  14.17 ml/m LA Vol (A4C):   47.0 ml 26.96 ml/m LA Biplane Vol: 51.4 ml 29.49 ml/m AORTIC VALVE LVOT Vmax:   116.00 cm/s LVOT Vmean:  79.600 cm/s LVOT VTI:    0.236 m  AORTA Ao Root diam: 2.80 cm Ao Asc diam:  3.20 cm  MITRAL VALVE               TRICUSPID VALVE MV Area (PHT): 3.03 cm    TR Peak grad:   28.1 mmHg MV Decel Time: 250 msec    TR Vmax:        265.00 cm/s MV E velocity: 97.90 cm/s MV A velocity: 94.10 cm/s  SHUNTS MV E/A ratio:  1.04        Systemic VTI:  0.24 m Systemic Diam: 1.90 cm  Armanda Magic MD Electronically signed by Armanda Magic MD Signature Date/Time: 01/29/2021/10:38:04 AM    Final  MONITORS  LONG TERM MONITOR (3-14 DAYS) 09/13/2020       ______________________________________________________________________________________________      EKG:        Recent Labs: 02/13/2023: B Natriuretic Peptide 75.7 08/12/2023: BUN 13; Creatinine, Ser 0.75; Hemoglobin 12.6; Platelets 133; Potassium 3.5;  Sodium 141 08/19/2023: ALT 21  Recent Lipid Panel    Component Value Date/Time   CHOL 150 04/04/2023 0948   TRIG 108 04/04/2023 0948   HDL 63 04/04/2023 0948   CHOLHDL 2.4 04/04/2023 0948   CHOLHDL 2 03/22/2022 1239   VLDL 19.6 03/22/2022 1239   LDLCALC 68 04/04/2023 0948   LDLDIRECT 73.4 05/20/2014 1651     Risk Assessment/Calculations:                Physical Exam:    VS:  BP 116/70   Pulse 73   Ht 5\' 2"  (1.575 m)   Wt 144 lb (65.3 kg)   LMP  (LMP Unknown)   SpO2 96%   BMI 26.34 kg/m     Wt Readings from Last 3 Encounters:  09/16/23 144 lb (65.3 kg)  08/29/23 145 lb 6 oz (65.9 kg)  08/22/23 142 lb (64.4 kg)     GEN:  Well nourished, well developed in no acute distress HEENT: Normal NECK: No JVD; No carotid bruits LYMPHATICS: No lymphadenopathy CARDIAC: RRR, no murmurs, rubs, gallops RESPIRATORY:  Clear to auscultation without rales, wheezing or rhonchi  ABDOMEN: Soft, non-tender, non-distended MUSCULOSKELETAL: 1+ right lower extremity edema, left ankle in a brace; No deformity  SKIN: Warm and dry NEUROLOGIC:  Alert and oriented x 3 PSYCHIATRIC:  Normal affect   Assessment & Plan Coronary artery disease involving native coronary artery of native heart without angina pectoris She continues on aspirin for antiplatelet therapy.  Patient treated with a statin drug. Chronic heart failure with preserved ejection fraction (HCC) Appears euvolemic.  She does have some right lower extremity swelling but has had a previous problem with that leg and I do not think her swelling is cardiac-related.  Will reduce her amlodipine as outlined above just in case this is contributing especially since her blood pressure is running low. Mixed hyperlipidemia Treated with simvastatin.  LDL cholesterol 68. Essential hypertension The patient's metoprolol was stopped in the hospital.  Her blood pressure may still be overtreated as she is significantly fatigued and running a relatively  low blood pressure today at 116/70 mmHg.  I advised her to reduce amlodipine to 5 mg daily.  She will continue on losartan 100 mg daily.  No other changes are made in her medical regimen today.    Medication Adjustments/Labs and Tests Ordered: Current medicines are reviewed at length with the patient today.  Concerns regarding medicines are outlined above.  No orders of the defined types were placed in this encounter.  Meds ordered this encounter  Medications   amLODipine (NORVASC) 5 MG tablet    Sig: Take 1 tablet (5 mg total) by mouth daily.    Dispense:  90 tablet    Refill:  3    Dose DECREASE    Patient Instructions  Medication Instructions:  DECREASE Amlodipine to 5mg  daily *If you need a refill on your cardiac medications before your next appointment, please call your pharmacy*  Follow-Up: At [facility], you and your health needs are our priority.  As part of our continuing mission to provide you with exceptional heart care, we have created designated Provider Care Teams.  These Care Teams include your primary  Cardiologist (physician) and Advanced Practice Providers (APPs -  Physician Assistants and Nurse Practitioners) who all work together to provide you with the care you need, when you need it.  Your next appointment:   1 year(s)  Provider:   Tonny Bollman, MD      1st Floor: - Lobby - Registration  - Pharmacy  - Lab - Cafe  2nd Floor: - PV Lab - Diagnostic Testing (echo, CT, nuclear med)  3rd Floor: - Vacant  4th Floor: - TCTS (cardiothoracic surgery) - AFib Clinic - Structural Heart Clinic - Vascular Surgery  - Vascular Ultrasound  5th Floor: - HeartCare Cardiology (general and EP) - Clinical Pharmacy for coumadin, hypertension, lipid, weight-loss medications, and med management appointments    Valet parking services will be available as well.     Signed, Tonny Bollman, MD  09/16/2023 12:54 PM    [facility]

## 2023-09-16 NOTE — Assessment & Plan Note (Signed)
 Appears euvolemic.  She does have some right lower extremity swelling but has had a previous problem with that leg and I do not think her swelling is cardiac-related.  Will reduce her amlodipine as outlined above just in case this is contributing especially since her blood pressure is running low.

## 2023-09-16 NOTE — Assessment & Plan Note (Signed)
 She continues on aspirin for antiplatelet therapy.  Patient treated with a statin drug.

## 2023-09-24 ENCOUNTER — Telehealth: Payer: Self-pay | Admitting: Gastroenterology

## 2023-09-24 NOTE — Telephone Encounter (Signed)
 The pt last saw Gunnar Fusi however, she is a Dr Adela Lank pt.  She did have a procedure with Dr Doy Hutching at the hospital.

## 2023-09-24 NOTE — Telephone Encounter (Signed)
 That would be up to Dr Adela Lank since Gunnar Fusi is no longer seeing patients in the office.  I have sent to the correct POD.

## 2023-09-24 NOTE — Telephone Encounter (Signed)
 Heather [name] the pt is scheduled for an EUS with Dr Meridee Score on 10/03/23. Does this need to be sent to him to decide if she still needs the EUS?

## 2023-09-24 NOTE — Telephone Encounter (Signed)
 Dr Adela Lank please see notes below. Pt is calling to see if she still needs to have the EUS.

## 2023-09-24 NOTE — Telephone Encounter (Signed)
 Heather [name] see the note from Dr Sunnie Nielsen

## 2023-09-24 NOTE — Telephone Encounter (Signed)
 Patty, I agree will let Dr. Adela Lank decide what he would like. GM

## 2023-09-24 NOTE — Telephone Encounter (Signed)
 Spoke with pt and she is aware of recommendations per Dr Adela Lank, she will keep the appt as scheduled.

## 2023-09-24 NOTE — Telephone Encounter (Signed)
 Patient called and stated that she was scheduled originally for a EGD in February but due to the snow she cancelled her appointment and then she was rescheduled for an EGD on the 27 th of this month. Patient stated that she is currently feeling better since her last hospital visit and was wondering if there was a reason she should keep her EGD procedure. Patient is requesting a call back.Please advise.

## 2023-09-24 NOTE — Telephone Encounter (Signed)
 Dr Mirian Capuchin and Dr Adela Lank requested EUS for this pt for abdominal pain, elevated LFT, coffee ground emesis  and the pt wants to know if she still needs to have this done since she feels better. I have recommended Dr Adela Lank to review since he and Gunnar Fusi discussed the pt and referred to you to proceed.  FYI in case you want to add to the conversation

## 2023-09-24 NOTE — Telephone Encounter (Signed)
 Dr Adela Lank will have to decided since he is the primary GI and it is being requested by him and Gunnar Fusi.

## 2023-09-24 NOTE — Telephone Encounter (Signed)
 Yes, I recommend she proceed with the EUS if she can. This was recommended by Dr. Myrtie Neither who had her during inpatient stay"  "The pain has not recurred since admission, and the LFTs continue to trend toward normal.  This along with the MRCP report suggest that there is not convincing evidence of choledocholithiasis, and my biliary endoscopist partner agreed we should not proceed with ERCP at this juncture. That said, I still have concerns that she could have a nonobstructing stone or stones "ball valving" in the CBD.  EUS would be the next best test to evaluate this"  EUS was recommended to be done as outpatient to further evaluate for microlithiasis and rule out problems with the SOD. I am glad to hear she is feeling better, reason to do this is to help prevent recurrent symptoms in the past. If she is willing I recommend she have it done. Thanks

## 2023-09-26 NOTE — Telephone Encounter (Signed)
 Procedure:endo Procedure date: 10/03/23 Procedure location: [facility] Arrival Time: 1:30pm Spoke with the patient Y/N: y Any prep concerns? n  Has the patient obtained the prep from the pharmacy ? n Do you have a care partner and transportation: y Any additional concerns? n

## 2023-09-27 ENCOUNTER — Encounter (HOSPITAL_COMMUNITY): Payer: Self-pay | Admitting: Gastroenterology

## 2023-10-02 DIAGNOSIS — H0288A Meibomian gland dysfunction right eye, upper and lower eyelids: Secondary | ICD-10-CM | POA: Diagnosis not present

## 2023-10-02 DIAGNOSIS — R7303 Prediabetes: Secondary | ICD-10-CM | POA: Diagnosis not present

## 2023-10-02 DIAGNOSIS — H35373 Puckering of macula, bilateral: Secondary | ICD-10-CM | POA: Diagnosis not present

## 2023-10-02 DIAGNOSIS — H16223 Keratoconjunctivitis sicca, not specified as Sjogren's, bilateral: Secondary | ICD-10-CM | POA: Diagnosis not present

## 2023-10-02 DIAGNOSIS — L718 Other rosacea: Secondary | ICD-10-CM | POA: Diagnosis not present

## 2023-10-02 DIAGNOSIS — H35033 Hypertensive retinopathy, bilateral: Secondary | ICD-10-CM | POA: Diagnosis not present

## 2023-10-02 DIAGNOSIS — H524 Presbyopia: Secondary | ICD-10-CM | POA: Diagnosis not present

## 2023-10-02 DIAGNOSIS — I10 Essential (primary) hypertension: Secondary | ICD-10-CM | POA: Diagnosis not present

## 2023-10-02 DIAGNOSIS — H0288B Meibomian gland dysfunction left eye, upper and lower eyelids: Secondary | ICD-10-CM | POA: Diagnosis not present

## 2023-10-03 ENCOUNTER — Ambulatory Visit (HOSPITAL_COMMUNITY): Admitting: Anesthesiology

## 2023-10-03 ENCOUNTER — Encounter (HOSPITAL_COMMUNITY)
Admission: RE | Disposition: A | Discharge status: Home / Self Care | Payer: Self-pay | Source: Home / Self Care | Attending: Gastroenterology

## 2023-10-03 ENCOUNTER — Ambulatory Visit (HOSPITAL_COMMUNITY)
Admission: RE | Admit: 2023-10-03 | Discharge: 2023-10-03 | Disposition: A | Discharge status: Home / Self Care | Payer: Medicare HMO | Attending: Gastroenterology | Admitting: Gastroenterology

## 2023-10-03 ENCOUNTER — Ambulatory Visit: Payer: Self-pay

## 2023-10-03 ENCOUNTER — Other Ambulatory Visit: Payer: Self-pay

## 2023-10-03 ENCOUNTER — Encounter (HOSPITAL_COMMUNITY): Payer: Self-pay | Admitting: Gastroenterology

## 2023-10-03 DIAGNOSIS — K8689 Other specified diseases of pancreas: Secondary | ICD-10-CM | POA: Diagnosis not present

## 2023-10-03 DIAGNOSIS — I700 Atherosclerosis of aorta: Secondary | ICD-10-CM | POA: Diagnosis not present

## 2023-10-03 DIAGNOSIS — I899 Noninfective disorder of lymphatic vessels and lymph nodes, unspecified: Secondary | ICD-10-CM | POA: Diagnosis not present

## 2023-10-03 DIAGNOSIS — Z8711 Personal history of peptic ulcer disease: Secondary | ICD-10-CM | POA: Diagnosis not present

## 2023-10-03 DIAGNOSIS — K920 Hematemesis: Secondary | ICD-10-CM

## 2023-10-03 DIAGNOSIS — R7989 Other specified abnormal findings of blood chemistry: Secondary | ICD-10-CM

## 2023-10-03 DIAGNOSIS — I10 Essential (primary) hypertension: Secondary | ICD-10-CM

## 2023-10-03 DIAGNOSIS — I708 Atherosclerosis of other arteries: Secondary | ICD-10-CM | POA: Insufficient documentation

## 2023-10-03 DIAGNOSIS — K219 Gastro-esophageal reflux disease without esophagitis: Secondary | ICD-10-CM | POA: Insufficient documentation

## 2023-10-03 DIAGNOSIS — F418 Other specified anxiety disorders: Secondary | ICD-10-CM | POA: Diagnosis not present

## 2023-10-03 DIAGNOSIS — Z86718 Personal history of other venous thrombosis and embolism: Secondary | ICD-10-CM | POA: Insufficient documentation

## 2023-10-03 DIAGNOSIS — Z9889 Other specified postprocedural states: Secondary | ICD-10-CM | POA: Diagnosis not present

## 2023-10-03 DIAGNOSIS — K449 Diaphragmatic hernia without obstruction or gangrene: Secondary | ICD-10-CM

## 2023-10-03 DIAGNOSIS — Z955 Presence of coronary angioplasty implant and graft: Secondary | ICD-10-CM | POA: Insufficient documentation

## 2023-10-03 DIAGNOSIS — R7401 Elevation of levels of liver transaminase levels: Secondary | ICD-10-CM | POA: Diagnosis not present

## 2023-10-03 DIAGNOSIS — E119 Type 2 diabetes mellitus without complications: Secondary | ICD-10-CM | POA: Insufficient documentation

## 2023-10-03 DIAGNOSIS — I110 Hypertensive heart disease with heart failure: Secondary | ICD-10-CM | POA: Diagnosis not present

## 2023-10-03 DIAGNOSIS — K869 Disease of pancreas, unspecified: Secondary | ICD-10-CM | POA: Insufficient documentation

## 2023-10-03 DIAGNOSIS — I2510 Atherosclerotic heart disease of native coronary artery without angina pectoris: Secondary | ICD-10-CM | POA: Diagnosis not present

## 2023-10-03 DIAGNOSIS — R1011 Right upper quadrant pain: Secondary | ICD-10-CM | POA: Diagnosis not present

## 2023-10-03 DIAGNOSIS — K2289 Other specified disease of esophagus: Secondary | ICD-10-CM | POA: Diagnosis not present

## 2023-10-03 DIAGNOSIS — R109 Unspecified abdominal pain: Secondary | ICD-10-CM

## 2023-10-03 DIAGNOSIS — K3189 Other diseases of stomach and duodenum: Secondary | ICD-10-CM | POA: Diagnosis not present

## 2023-10-03 DIAGNOSIS — I5032 Chronic diastolic (congestive) heart failure: Secondary | ICD-10-CM | POA: Insufficient documentation

## 2023-10-03 DIAGNOSIS — K838 Other specified diseases of biliary tract: Secondary | ICD-10-CM

## 2023-10-03 HISTORY — PX: ESOPHAGOGASTRODUODENOSCOPY: SHX5428

## 2023-10-03 HISTORY — PX: EUS: SHX5427

## 2023-10-03 LAB — GLUCOSE, CAPILLARY: Glucose-Capillary: 113 mg/dL — ABNORMAL HIGH (ref 70–99)

## 2023-10-03 SURGERY — UPPER ENDOSCOPIC ULTRASOUND (EUS) RADIAL
Anesthesia: Monitor Anesthesia Care

## 2023-10-03 MED ORDER — SODIUM CHLORIDE 0.9 % IV SOLN: INTRAVENOUS | Status: DC

## 2023-10-03 MED ORDER — PROPOFOL 500 MG/50ML IV EMUL
INTRAVENOUS | Status: DC | PRN
  Administered 2023-10-03: 175 ug/kg/min via INTRAVENOUS

## 2023-10-03 NOTE — H&P (Signed)
 GASTROENTEROLOGY PROCEDURE H&P NOTE   Primary Care Physician: Bradd Canary, MD  HPI: Heather [name] is a 89 y.o. female who presents for EGD/EUS to evaluate and rule out choledocholithiasis and other reasons for recurrent RUQ pain.  Past Medical History:  Diagnosis Date   Aortic atherosclerosis (HCC) 10/12/2021   Cancer (HCC)    Uterine   Cataract    Chronic heart failure with preserved ejection fraction (HFpEF) (HCC)    Chronic stable angina (HCC)    Coronary artery disease cardiologist--- dr Excell Seltzer   Diabetes mellitus type 2, diet-controlled (HCC)    followed by pcp  (10-19-2020  pt stated checks daily in am,  fasting blood surgar--- 115--120s)   DOE (dyspnea on exertion)    per pt "when I over do",  ok with household chores   Echocardiogram 08/2020    Echocardiogram 2/22: EF 55-60, no RWMA, mild LVH, Gr 2 DD, GLS-21.7%, normal RVSF, trivial MR, RVSP 39.5   Edema of right lower extremity    GERD (gastroesophageal reflux disease)    Hiatal hernia    recurrence,  hx HH repair 1989   History of cervical cancer    s/p  vaginal hysterectomy   History of DVT of lower extremity 2016   11-29-2014 post op right TKA of right lower extremity and completed xarelto    History of esophageal stricture    hx s/p dilatation's   History of gastric ulcer 2005 approx.   History of palpitations 2010   event monitor 07-07-2009 showed NSR w/ freq. SVT ectopies with short runs, rare PVCs   History of TIA (transient ischemic attack) 06/1999   12-15-2019  per pt had several TIA between 12/ 2000 to 02/ 2001 , was sent to specialist @[facility] , had test that was normal (10-19-2020 pt stated no TIAs since ) but has residual of essential tremors of right arm/ hand   Hypertension    Intermittent palpitations    IT band syndrome    Migraine    "ice pick headche lasts about 30 seconds"   Mixed hyperlipidemia    Mixed incontinence urge and stress    Multiple thyroid nodules    followed by pcp---    ultrasound 11-22-2014 no bx   (12-15-2019 per pt had a endocrinologist and was told did not need bx)   OA (osteoarthritis)    knees, elbow, hip, ankles   Occasional tremors    right arm/ hand  s/p TIA residual 2000   Osteoporosis    taking vitamin d   Peroneal DVT (deep venous thrombosis) (HCC) 12/22/2014   Right bundle branch block (RBBB) with left anterior fascicular block (LAFB)    RLS (restless legs syndrome)    S/P drug eluting coronary stent placement 2006   03-16-2005  PCI x1 DES to LCx;   06-27-2005  PCI x1 DES to RCA   Stroke [facility])    TIA's   Urinary retention    post op sling prodecure on 10-27-2020, has foley cathether   Past Surgical History:  Procedure Laterality Date   ANTERIOR AND POSTERIOR REPAIR N/A 12/22/2019   Procedure: ANTERIOR (CYSTOCELE)  REPAIR;  Surgeon: Sherian Rein, MD;  Location: [facility];  Service: Gynecology;  Laterality: N/A;   ANTERIOR AND POSTERIOR REPAIR WITH SACROSPINOUS FIXATION N/A 10/27/2020   Procedure: SACROSPINOUS LIGAMENT FIXATION;  Surgeon: Marguerita Beards, MD;  Location: [facility];  Service: Gynecology;  Laterality: N/A;   BIOPSY  08/12/2023   Procedure: BIOPSY;  Surgeon: Ottie Glazier, MD;  Location: [facility] ENDOSCOPY;  Service: Gastroenterology;;   BLADDER SUSPENSION N/A 10/27/2020   Procedure: TRANSVAGINAL TAPE (TVT) PROCEDURE;  Surgeon: Marguerita Beards, MD;  Location: [facility];  Service: Gynecology;  Laterality: N/A;   CATARACT EXTRACTION W/ INTRAOCULAR LENS  IMPLANT, BILATERAL  2015   CHOLECYSTECTOMY N/A 01/25/2021   Procedure: LAPAROSCOPIC CHOLECYSTECTOMY WITH INTRAOPERATIVE CHOLANGIOGRAM AND LYSIS OF ADHESIONS;  Surgeon: Karie Soda, MD;  Location: [facility];  Service: General;  Laterality: N/A;   COLONOSCOPY  last one ?   CORONARY ANGIOPLASTY WITH STENT PLACEMENT  03-16-2005   dr Riley Kill   PCI and DES x1 to LCx   CORONARY ANGIOPLASTY WITH STENT PLACEMENT   06-27-2005  dr Riley Kill   PCI and DES x1 to RCA with residual disease LAD 70-80% to manage medically   CYSTOSCOPY N/A 10/27/2020   Procedure: CYSTOSCOPY;  Surgeon: Marguerita Beards, MD;  Location: [facility];  Service: Gynecology;  Laterality: N/A;   CYSTOSCOPY N/A 11/09/2020   Procedure: CYSTOSCOPY;  Surgeon: Marguerita Beards, MD;  Location: [facility];  Service: Gynecology;  Laterality: N/A;   ESOPHAGOGASTRODUODENOSCOPY (EGD) WITH PROPOFOL N/A 08/12/2023   Procedure: ESOPHAGOGASTRODUODENOSCOPY (EGD) WITH PROPOFOL;  Surgeon: Ottie Glazier, MD;  Location: [facility] ENDOSCOPY;  Service: Gastroenterology;  Laterality: N/A;   EYE SURGERY     FOOT SURGERY Left 1990s   left foot stress fracture repair, per pt no hardware   FRACTURE SURGERY     HERNIA REPAIR     HIATAL HERNIA REPAIR  1989   INCISIONAL HERNIA REPAIR N/A 01/25/2021   Procedure: PRIMARY REPAIR OF INCISIONAL HERNIA;  Surgeon: Karie Soda, MD;  Location: [facility];  Service: General;  Laterality: N/A;   INGUINAL HERNIA REPAIR Left 04/17/2021   Procedure: LAPAROSCOPIC LEFT INGUINAL HERNIA REPAIR WITH MESH;  Surgeon: Axel Filler, MD;  Location: [facility];  Service: General;  Laterality: Left;   JOINT REPLACEMENT     KNEE ARTHROSCOPY Bilateral right ?/   left x2 , last one 09-12-2009 @ [facility]   LYSIS OF ADHESION N/A 02/11/2023   Procedure: LYSIS OF ADHESION;  Surgeon: Axel Filler, MD;  [facility]: [facility] OR;  Service: General;  Laterality: N/A;   PUBOVAGINAL SLING N/A 11/09/2020   Procedure: REVISION OF Leonides Grills;  Surgeon: Marguerita Beards, MD;  Location: [facility];  Service: Gynecology;  Laterality: N/A;   RECTOCELE REPAIR N/A 04/20/2020   Procedure: POSTERIOR REPAIR (RECTOCELE);  Surgeon: Sherian Rein, MD;  Location: [facility];  Service: Gynecology;  Laterality: N/A;   RECTOCELE REPAIR N/A 10/27/2020   Procedure: POSTERIOR REPAIR (RECTOCELE);   Surgeon: Marguerita Beards, MD;  Location: [facility];  Service: Gynecology;  Laterality: N/A;  total time requested for all procedures is 2 hours   SMALL INTESTINE SURGERY     TOTAL KNEE ARTHROPLASTY  11/12/2011   Procedure: TOTAL KNEE ARTHROPLASTY;  Surgeon: Nilda Simmer, MD;  Location: [facility] OR;  Service: Orthopedics;  Laterality: Left;  Dr Thurston Hole wants 90 minutes for this case   TOTAL KNEE ARTHROPLASTY Right 11/29/2014   Procedure: RIGHT TOTAL KNEE ARTHROPLASTY;  Surgeon: Dannielle Huh, MD;  Location: [facility] OR;  Service: Orthopedics;  Laterality: Right;   UPPER GASTROINTESTINAL ENDOSCOPY  last one 04-25-2017   with dilatation esophageal stricture and savary dilatation   VAGINAL HYSTERECTOMY  1988    no ovaries removed for bleeeding   VENTRAL HERNIA REPAIR N/A 02/11/2023  Procedure: LAPAROSCOPIC ATTEMPTED OPEN VENTRAL HERNIA REPAIR WITH MESH;  Surgeon: Axel Filler, MD;  Location: [facility] OR;  Service: General;  Laterality: N/A;  2.5 HRS   Current Facility-Administered Medications  Medication Dose Route Frequency Provider Last Rate Last Admin   0.9 %  sodium chloride infusion   Intravenous Continuous Mansouraty, Netty Starring., MD        Current Facility-Administered Medications:    0.9 %  sodium chloride infusion, , Intravenous, Continuous, Mansouraty, Netty Starring., MD Allergies  Allergen Reactions   Baclofen Other (See Comments)    Hyperactivity    Gabapentin Other (See Comments)    Made her hyper   Nitrofurantoin Rash   Family History  Problem Relation Age of Onset   Stroke Father        family hx of M 1st degree relative <50   Heart disease Father    Coronary artery disease Mother    Heart disease Mother    Arthritis Mother    Hearing loss Mother    Vision loss Mother    Depression Brother    Cancer Brother    Stroke Brother    Diabetes Brother    Cancer Brother        bladder with mets   Diabetes Daughter        borderline   Hypertension Daughter     Arthritis Other        family hx of   Hypertension Other        family hx of   Other Other        family hx of cardiovascular disorder   Thyroid disease Daughter    Early death Daughter    Breast cancer Neg Hx    Colon cancer Neg Hx    Anesthesia problems Neg Hx    Hypotension Neg Hx    Malignant hyperthermia Neg Hx    Pseudochol deficiency Neg Hx    Colon polyps Neg Hx    Esophageal cancer Neg Hx    Rectal cancer Neg Hx    Stomach cancer Neg Hx    Social History   Socioeconomic History   Marital status: Widowed    Spouse name: Not on file   Number of children: 3   Years of education: 93   Highest education level: Some college, no degree  Occupational History   Occupation: works partime in Public affairs consultant: RETIRED    Comment: retired  Tobacco Use   Smoking status: Never   Smokeless tobacco: Never  Vaping Use   Vaping status: Never Used  Substance and Sexual Activity   Alcohol use: No    Alcohol/week: 0.0 standard drinks of alcohol   Drug use: Never   Sexual activity: Not Currently    Birth control/protection: Surgical    Comment: lives alone  Other Topics Concern   Not on file  Social History Narrative   Lives with husband, Caffeine use- Half Caffeine)- 2 cups daily.  3 children living, one passed away.   Education: HS. Business college.  Retired.    Social Drivers of Corporate investment banker Strain: Low Risk  (08/05/2023)   Overall Financial Resource Strain (CARDIA)    Difficulty of Paying Living Expenses: Not hard at all  Food Insecurity: No Food Insecurity (08/13/2023)   Hunger Vital Sign    Worried About Running Out of Food in the Last Year: Never true    Ran Out of Food in the Last Year: Never true  Transportation  Needs: No Transportation Needs (08/13/2023)   PRAPARE - Administrator, Civil Service (Medical): No    Lack of Transportation (Non-Medical): No  Physical Activity: Inactive (09/04/2023)   Exercise Vital Sign    Days of Exercise  per Week: 0 days    Minutes of Exercise per Session: 0 min  Stress: Stress Concern Present (09/04/2023)   [employer] of Occupational Health - Occupational Stress Questionnaire    Feeling of Stress : Rather much  Social Connections: Moderately Isolated (08/09/2023)   Social Connection and Isolation Panel [NHANES]    Frequency of Communication with Friends and Family: More than three times a week    Frequency of Social Gatherings with Friends and Family: Three times a week    Attends Religious Services: More than 4 times per year    Active Member of Clubs or Organizations: No    Attends Banker Meetings: Never    Marital Status: Widowed  Intimate Partner Violence: Not At Risk (08/13/2023)   Humiliation, Afraid, Rape, and Kick questionnaire    Fear of Current or Ex-Partner: No    Emotionally Abused: No    Physically Abused: No    Sexually Abused: No    Physical Exam: Today's Vitals   10/03/23 1347  BP: (!) 171/65  Pulse: 69  Resp: 15  Temp: 97.8 F (36.6 C)  TempSrc: Tympanic  SpO2: 97%  Weight: 65.3 kg  Height: 5\' 2"  (1.575 m)  PainSc: 0-No pain   Body mass index is 26.33 kg/m. GEN: NAD EYE: Sclerae anicteric ENT: MMM CV: Non-tachycardic GI: Soft, NT/ND NEURO:  Alert & Oriented x 3  Lab Results: No results for input(s): "WBC", "HGB", "HCT", "PLT" in the last 72 hours. BMET No results for input(s): "NA", "K", "CL", "CO2", "GLUCOSE", "BUN", "CREATININE", "CALCIUM" in the last 72 hours. LFT No results for input(s): "PROT", "ALBUMIN", "AST", "ALT", "ALKPHOS", "BILITOT", "BILIDIR", "IBILI" in the last 72 hours. PT/INR No results for input(s): "LABPROT", "INR" in the last 72 hours.   Impression / Plan: This is a 89 y.o.female who presents for EGD/EUS to evaluate and rule out choledocholithiasis and other reasons for recurrent RUQ pain.  The risks of an EUS including intestinal perforation, bleeding, infection, aspiration, and medication effects were  discussed as was the possibility it may not give a definitive diagnosis if a biopsy is performed.  When a biopsy of the pancreas is done as part of the EUS, there is an additional risk of pancreatitis at the rate of about 1-2%.  It was explained that procedure related pancreatitis is typically mild, although it can be severe and even life threatening, which is why we do not perform random pancreatic biopsies and only biopsy a lesion/area we feel is concerning enough to warrant the risk.   The risks and benefits of endoscopic evaluation/treatment were discussed with the patient and/or family; these include but are not limited to the risk of perforation, infection, bleeding, missed lesions, lack of diagnosis, severe illness requiring hospitalization, as well as anesthesia and sedation related illnesses.  The patient's history has been reviewed, patient examined, no change in status, and deemed stable for procedure.  The patient and/or family is agreeable to proceed.    Corliss Parish, MD [facility] Office # [phone]

## 2023-10-03 NOTE — Discharge Instructions (Signed)

## 2023-10-03 NOTE — Patient Outreach (Signed)
 Care Coordination   Follow Up Visit Note   10/03/2023 Name: Heather [name] MRN: [id number] DOB: 10-Apr-1935  Heather [name] is a 89 y.o. year old female who sees Heather Canary, MD for primary care. I spoke with  Heather [name] by phone today.  What matters to the patients health and wellness today?  Heather [name] reports she is doing well. She reports swelling in her right leg has gone down a lot with healing of her leg wound. She continues to monitor BP. Today BP 134/57. She has an upper endoscopic ultrasound scheduled for today for evaluation. However, Heather [name] denies any abdominal pain at this time. She report primary concern today is major repair work that is needed on her home that insurance will not cover.  Goals Addressed             This Visit's Progress    Care Coordination Activities-health management       Interventions Today    Flowsheet Row Most Recent Value  Chronic Disease   Chronic disease during today's visit Other, Congestive Heart Failure (CHF), Diabetes  [diet controlled diabetes]  General Interventions   General Interventions Discussed/Reviewed General Interventions Reviewed, Doctor Visits, [employer] of current treatment plan for health condition and patient's adherence to plan. referral to care guide for re: home repair needs/community housing solutions.]  Doctor Visits Discussed/Reviewed PCP, Specialist  PCP/Specialist Visits Compliance with follow-up visit  [reviewed upcoming provider visits including upper endoscopic ultrasound scheduled for today.confirmed patient has transportation.]  Education Interventions   Education Provided Provided Education  Provided Verbal Education On Mental Health/Coping with Illness, When to see the doctor, Medication  [patient reports concern for home repairs needed that insurance will not cover. discussed contact provider with health questions/concerns, taking medications as prescribed, reviewed signs/symptoms  of HF exacerbation]  Nutrition Interventions   Nutrition Discussed/Reviewed Nutrition Reviewed  [discussed diet controlled diabetes]  Pharmacy Interventions   Pharmacy Dicussed/Reviewed Pharmacy Topics Reviewed  [medications reviewed]            SDOH assessments and interventions completed:  No  Care Coordination Interventions:  Yes, provided   Follow up plan: Follow up call scheduled for 10/11/23    Encounter Outcome:  Patient Visit Completed   Heather Sheriff, RN, MSN, BSN, CCM [place]  [facility], Population Health Case Manager Phone: [phone]

## 2023-10-03 NOTE — Transfer of Care (Signed)
 Immediate Anesthesia Transfer of Care Note  Patient: Heather [name]  Procedure(s) Performed: UPPER ENDOSCOPIC ULTRASOUND (EUS) RADIAL EGD (ESOPHAGOGASTRODUODENOSCOPY)  Patient Location: PACU  Anesthesia Type:MAC  Level of Consciousness: sedated, patient cooperative, and responds to stimulation  Airway & Oxygen Therapy: Patient Spontanous Breathing and Patient connected to face mask oxygen  Post-op Assessment: Report given to RN and Post -op Vital signs reviewed and stable  Post vital signs: Reviewed and stable  Last Vitals:  Vitals Value Taken Time  BP 112/37 10/03/23 1516  Temp    Pulse 57 10/03/23 1516  Resp 13 10/03/23 1516  SpO2 100 % 10/03/23 1516    Last Pain:  Vitals:   10/03/23 1516  TempSrc:   PainSc: Asleep         Complications: No notable events documented.

## 2023-10-03 NOTE — Op Note (Signed)
 [facility] Patient Name: Heather [name] Procedure Date: 10/03/2023 MRN: [id number] Attending MD: Corliss Parish , MD, [id number] Date of Birth: 04-Sep-1934 CSN: [id number] Age: 89 Admit Type: Outpatient Procedure:                Upper EUS Indications:              Suspected choledocholithiasis, Epigastric abdominal                            pain, Abdominal pain in the right upper quadrant Providers:                Corliss Parish, MD, Lorenza Evangelist, RN,                            Kandice Robinsons, Technician Referring MD:              Medicines:                Monitored Anesthesia Care Complications:            No immediate complications. Estimated Blood Loss:     Estimated blood loss was minimal. Procedure:                Pre-Anesthesia Assessment:                           - Prior to the procedure, a History and Physical                            was performed, and patient medications and                            allergies were reviewed. The patient's tolerance of                            previous anesthesia was also reviewed. The risks                            and benefits of the procedure and the sedation                            options and risks were discussed with the patient.                            All questions were answered, and informed consent                            was obtained. Prior Anticoagulants: The patient has                            taken no anticoagulant or antiplatelet agents                            except for aspirin. ASA Grade Assessment: III - A  patient with severe systemic disease. After                            reviewing the risks and benefits, the patient was                            deemed in satisfactory condition to undergo the                            procedure.                           After obtaining informed consent, the endoscope was                            passed  under direct vision. Throughout the                            procedure, the patient's blood pressure, pulse, and                            oxygen saturations were monitored continuously. The                            [id number]-[id number] ([id number]) Olympus endoscope was introduced                            through the mouth, and advanced to the second part                            of duodenum. The [id number]-[id number] ([id number]) Olympus                            duodenoscope was introduced through the mouth, and                            advanced to the area of papilla. The [id number]                            ([id number]) Olympus linear ultrasound scope was                            introduced through the mouth, and advanced to the                            duodenum for ultrasound examination from the                            stomach and duodenum. The upper EUS was                            accomplished without difficulty. The patient  tolerated the procedure. Scope In: Scope Out: Findings:      ENDOSCOPIC FINDING: :      No gross lesions were noted in the entire esophagus.      The Z-line was irregular and was found 33 cm from the incisors.      A 5 cm hiatal hernia was present.      Striped moderately erythematous mucosa was found in the entire examined       stomach.      No gross lesions were noted in the duodenal bulb, in the first portion       of the duodenum and in the second portion of the duodenum.      The major papilla was normal.      ENDOSONOGRAPHIC FINDING: :      Pancreatic parenchymal abnormalities were noted in the entire pancreas.       These consisted of hyperechoic strands.      The diameter of the main pancreatic duct (MPD) measured:      - HOP 2.9 mm (head of pancreas)      - NOP 1.9 mm (neck of pancreas)      - BOP 1.8 mm (body of the pancreas)      - TOP 1.0 mm (tail of the pancreas).      There was no sign of significant endosonographic  abnormality in the       common bile duct (3.7 -> 6.5 mm) and in the common hepatic duct (7.5       mm). There is slight prominence of the biliary tree,       postcholecystectomy. No stones, no biliary sludge and ducts with regular       contour were identified.      Endosonographic imaging of the ampulla showed no intramural       (subepithelial) lesion.      Endosonographic imaging in the visualized portion of the liver showed no       mass.      No malignant-appearing lymph nodes were visualized in the celiac region       (level 20), peripancreatic region and porta hepatis region.      The celiac region was visualized. There is evidence of atheroma in this       region. Impression:               EGD impression:                           - No gross lesions in the entire esophagus.                           - Z-line irregular, 33 cm from the incisors.                           - 5 cm hiatal hernia.                           - Erythematous mucosa in the stomach.                           - No gross lesions in the duodenal bulb, in the  first portion of the duodenum and in the second                            portion of the duodenum.                           - Normal major papilla.                           EUS impression:                           - Pancreatic parenchymal abnormalities consisting                            of hyperechoic strands were noted in the entire                            pancreas.                           - Main pancreatic duct (MPD) diameter was measured.                            Endosonographically, the MPD had a normal                            appearance.                           - There was no sign of significant pathology in the                            common bile duct and in the common hepatic duct.                            There is slight prominence of the biliary tree,                             postcholecystectomy. No evidence of                            choledocholithiasis.                           - No malignant-appearing lymph nodes were                            visualized in the celiac region (level 20),                            peripancreatic region and porta hepatis region.                           - Celiac takeoff atherosclerosis noted. Moderate Sedation:      Not Applicable - Patient had care per Anesthesia. Recommendation:           -  The patient will be observed post-procedure,                            until all discharge criteria are met.                           - Discharge patient to home.                           - Patient has a contact number available for                            emergencies. The signs and symptoms of potential                            delayed complications were discussed with the                            patient. Return to normal activities tomorrow.                            Written discharge instructions were provided to the                            patient.                           - Resume previous diet.                           - Observe patient's clinical course.                           - Return to referring physician.                           - If patient were to have evidence of recurrent                            right upper quadrant abdominal pain with associated                            LFT abnormalities and/or pancreatic enzyme                            abnormalities which rapidly improve patient doing                            well, consideration of functional disorder of the                            sphincter (previously SOD) could be had. Discussion                            as to whether ERCP with sphincterotomy could be  helpful to be discussed but would certainly be                            higher risk.                           - Query use of neuromodulation for  patient should                            she have continued episodes of pain/discomfort. It                            is not clear that she has evidence of mesenteric                            ischemia symptomatology, based on my brief                            discussions with her..                           - Follow-up with PCP if any additional                            workup/management is required, as there is evidence                            of atherosclerosis at her celiac takeoff (severe                            atherosclerotic changes noted on MRI/MRCP).                           - The findings and recommendations were discussed                            with the patient.                           - The findings and recommendations were discussed                            with the patient's family. Procedure Code(s):        --- Professional ---                           [phone], Esophagogastroduodenoscopy, flexible,                            transoral; with endoscopic ultrasound examination                            limited to the esophagus, stomach or duodenum, and  adjacent structures Diagnosis Code(s):        --- Professional ---                           K22.89, Other specified disease of esophagus                           K44.9, Diaphragmatic hernia without obstruction or                            gangrene                           K31.89, Other diseases of stomach and duodenum                           K86.9, Disease of pancreas, unspecified                           I89.9, Noninfective disorder of lymphatic vessels                            and lymph nodes, unspecified                           R10.13, Epigastric pain                           R10.11, Right upper quadrant pain CPT copyright 2022 American Medical Association. All rights reserved. The codes documented in this report are preliminary and upon coder review may  be  revised to meet current compliance requirements. Corliss Parish, MD 10/03/2023 3:27:20 PM Number of Addenda: 0

## 2023-10-03 NOTE — Anesthesia Preprocedure Evaluation (Signed)
 Anesthesia Evaluation  Patient identified by MRN, date of birth, ID band Patient awake    Reviewed: Allergy & Precautions, H&P , NPO status , Patient's Chart, lab work & pertinent test results  Airway Mallampati: II   Neck ROM: full    Dental   Pulmonary neg pulmonary ROS   breath sounds clear to auscultation       Cardiovascular hypertension, + CAD and + Cardiac Stents   Rhythm:regular Rate:Normal     Neuro/Psych  Headaches PSYCHIATRIC DISORDERS Anxiety Depression    CVA    GI/Hepatic hiatal hernia,GERD  ,,  Endo/Other  diabetes, Type 2    Renal/GU      Musculoskeletal  (+) Arthritis ,    Abdominal   Peds  Hematology   Anesthesia Other Findings   Reproductive/Obstetrics                             Anesthesia Physical Anesthesia Plan  ASA: 3  Anesthesia Plan: MAC   Post-op Pain Management:    Induction: Intravenous  PONV Risk Score and Plan: 2 and Propofol infusion and Treatment may vary due to age or medical condition  Airway Management Planned: Nasal Cannula  Additional Equipment:   Intra-op Plan:   Post-operative Plan:   Informed Consent: I have reviewed the patients History and Physical, chart, labs and discussed the procedure including the risks, benefits and alternatives for the proposed anesthesia with the patient or authorized representative who has indicated his/her understanding and acceptance.     Dental advisory given  Plan Discussed with: CRNA, Anesthesiologist and Surgeon  Anesthesia Plan Comments:        Anesthesia Quick Evaluation

## 2023-10-03 NOTE — Patient Instructions (Signed)
 Visit Information  Thank you for taking time to visit with me today. Please don't hesitate to contact me if I can be of assistance to you.   Following are the goals we discussed today:  Continue to take medications as prescribed. Continue to attend provider visits as scheduled Continue to eat healthy, lean meats, vegetables, fruits, avoid saturated and transfats Contact provider with health questions or concerns as needed Continue to check blood sugar as recommended and notify provider if questions or concerns Continue to check blood pressure routinely and contact provider if questions or concerns  Our next appointment is by telephone on 10/11/23 at 1:30 pm  Please call the care guide team at [phone] if you need to cancel or reschedule your appointment.   If you are experiencing a Mental Health or Behavioral Health Crisis or need someone to talk to, please call the Suicide and Crisis Lifeline: 988 call the [place] National Suicide Prevention Lifeline: 313-257-2691 or TTY: 331-065-6682 TTY (681) 439-7722) to talk to a trained counselor   Kathyrn Sheriff, RN, MSN, BSN, CCM [place]  [facility], Population Health Case Manager Phone: [phone]

## 2023-10-04 ENCOUNTER — Encounter (HOSPITAL_COMMUNITY): Payer: Self-pay | Admitting: Gastroenterology

## 2023-10-04 DIAGNOSIS — M19072 Primary osteoarthritis, left ankle and foot: Secondary | ICD-10-CM | POA: Diagnosis not present

## 2023-10-04 NOTE — Anesthesia Postprocedure Evaluation (Signed)
 Anesthesia Post Note  Patient: Heather [name]  Procedure(s) Performed: UPPER ENDOSCOPIC ULTRASOUND (EUS) RADIAL EGD (ESOPHAGOGASTRODUODENOSCOPY)     Patient location during evaluation: PACU Anesthesia Type: MAC Level of consciousness: awake and alert Pain management: pain level controlled Vital Signs Assessment: post-procedure vital signs reviewed and stable Respiratory status: spontaneous breathing, nonlabored ventilation, respiratory function stable and patient connected to nasal cannula oxygen Cardiovascular status: stable and blood pressure returned to baseline Postop Assessment: no apparent nausea or vomiting Anesthetic complications: no   No notable events documented.  Last Vitals:  Vitals:   10/03/23 1520 10/03/23 1530  BP: (!) 118/44 (!) 139/52  Pulse: (!) 57 (!) 56  Resp: 14 15  Temp:    SpO2: 98% 92%    Last Pain:  Vitals:   10/03/23 1520  TempSrc:   PainSc: Asleep                 Erynn Vaca S

## 2023-10-08 ENCOUNTER — Telehealth: Payer: Self-pay | Admitting: *Deleted

## 2023-10-08 NOTE — Progress Notes (Signed)
 Complex Care Management Note Care Guide Note  10/08/2023 Name: Heather [name] MRN: [id number] DOB: April 13, 1935  Heather [name] is a 89 y.o. year old female who is a primary care patient of Bradd Canary, MD . The community resource team was consulted for assistance with Home Modifications Patient needing a lot of under the house work done refered to community solutions and also the weatherization and housing rehab  SDOH screenings and interventions completed:  Yes  Social Drivers of Health From This Encounter   Housing: Low Risk  (10/08/2023)   Housing Stability Vital Sign    Unable to Pay for Housing in the Last Year: No    Number of Times Moved in the Last Year: 0    Homeless in the Last Year: No    SDOH Interventions Today    Flowsheet Row Most Recent Value  SDOH Interventions   Housing Interventions Community Resources Provided, Other (Comment)  [Patient needing a lot of under the house work done refered to community solutions and also the weatherization and housing rehab]        Care guide performed the following interventions: Patient provided with information about care guide support team and interviewed to confirm resource needs.  Follow Up Plan:  No further follow up planned at this time. The patient has been provided with needed resources.  Encounter Outcome:  Patient Visit Completed  Emailed resources

## 2023-10-11 ENCOUNTER — Ambulatory Visit: Payer: Self-pay

## 2023-10-11 NOTE — Patient Instructions (Signed)
 Visit Information  Thank you for taking time to visit with me today. Please don't hesitate to contact me if I can be of assistance to you.   Following are the goals we discussed today:  Continue to take medications as prescribed. Continue to attend provider visits as scheduled Contact resources provided by the care guide re: home modifications/repair. Contact provider with health questions or concerns as needed  If you are experiencing a Mental Health or Behavioral Health Crisis or need someone to talk to, please call the Suicide and Crisis Lifeline: 988 call the [place] National Suicide Prevention Lifeline: 430-796-1157 or TTY: 450 132 5384 TTY (540)087-3888) to talk to a trained counselor   Kathyrn Sheriff, RN, MSN, BSN, CCM [place]  [facility], Population Health Case Manager Phone: [phone]

## 2023-10-11 NOTE — Patient Outreach (Signed)
 Care Coordination   Follow Up Visit Note   10/11/2023 Name: Heather [name] MRN: [id number] DOB: 1934/12/20  Heather [name] is a 89 y.o. year old female who sees Heather Canary, MD for primary care. I spoke with  Heather [name] and daughter Heather [name] with patient consent(speaker) by phone today.  What matters to the patients health and wellness today? RNCM followed up with patient to ensure she received resources for home repairs. Patient states she has received the resources. She reports primary concern is home repairs. She denies any other questions at this time adding she has a follow up with orthopedic provider(Dr. Victorino [name]) on 10/25/23 to follow up on fractured foot. RNCM encouraged patient to contact RNCM and/or PCP if nursing care management needs in the future. Confirmed patient has RNCM contact number.  Goals Addressed             This Visit's Progress    COMPLETED: Care Coordination Activities-health management       Interventions Today    Flowsheet Row Most Recent Value  General Interventions   General Interventions Discussed/Reviewed [facility], Doctor Visits  [reiterated care management services and encouraged to contact RNCM and/or PCP if care management needs in the future.]  Doctor Visits Discussed/Reviewed PCP, Specialist  PCP/Specialist Visits Compliance with follow-up visit  [upcoming appointments reviewed]  Pharmacy Interventions   Pharmacy Dicussed/Reviewed Pharmacy Topics Reviewed  [patient denies any medication management concerns]  Safety Interventions   Safety Discussed/Reviewed Safety Discussed, Home Safety  Home Safety Refer for community resources  [encouraged to contact community resources provided by care guide. discussed communitiy housing solution and aging gracefully program. encouraged to contact to see if eligible for aging gracefully program.]            SDOH assessments and interventions completed:  No  Care Coordination Interventions:  Yes,  provided   Follow up plan: No further intervention required.   Encounter Outcome:  Patient Visit Completed   Heather Sheriff, RN, MSN, BSN, CCM [place]  [facility], Population Health Case Manager Phone: [phone]

## 2023-10-14 ENCOUNTER — Telehealth: Payer: Self-pay | Admitting: Family Medicine

## 2023-10-14 NOTE — Telephone Encounter (Signed)
 Copied from CRM [id number]. Topic: Medicare AWV >> Oct 14, 2023  2:07 PM Payton Doughty wrote: Reason for CRM: Called LVM 10/14/2023 to schedule AWV. Please schedule Virtual or Telehealth visits ONLY.   Verlee Rossetti; Care Guide Ambulatory Clinical Support [place] [facility] Direct Dial: [phone]

## 2023-10-22 ENCOUNTER — Telehealth: Payer: Self-pay

## 2023-10-22 ENCOUNTER — Ambulatory Visit: Payer: Self-pay

## 2023-10-22 ENCOUNTER — Ambulatory Visit: Admitting: Family Medicine

## 2023-10-22 DIAGNOSIS — N390 Urinary tract infection, site not specified: Secondary | ICD-10-CM

## 2023-10-22 MED ORDER — SULFAMETHOXAZOLE-TRIMETHOPRIM 800-160 MG PO TABS: 1.0000 | ORAL_TABLET | Freq: Two times a day (BID) | ORAL | 0 refills | Status: AC

## 2023-10-22 NOTE — Telephone Encounter (Signed)
 Patient has been notified and aware of the current treatment plan. Follow has been scheduled for 12-2023

## 2023-10-22 NOTE — Telephone Encounter (Signed)
 Patient calls today with concerns of a UTI. She has broken her foot and is currently in a cast. Due to this she has a hard time being mobile. I offered a nurse visit and she declined as its just to much for her to get around. She was last seen in 03-2024. She is requesting we treatment without coming in to the office. I did explain we do need to verify with a urine sample. She still insisted on treatment as she knows the symptoms. She checked her temp while on the phone and it was 98.2. complaints of burning with urination, has pressure and fullness of the bladder. Increased frequency this has now been on going for 3 days. Pateint is asking to just have an antibiotic sent in for her. Please advise

## 2023-10-22 NOTE — Telephone Encounter (Signed)
 We can make an exception since she broke her foot. Will prescribed bactrim DS BID x3 days. If she does not improve, then we will need to get a urine sample. Also please make sure she is still using the vaginal estrogen cream twice a week since this can prevent UTIs.   Since she had another UTI recently, she will need a follow up with either me or Kaitlin in the next month.   Arma Lamp, MD

## 2023-10-22 NOTE — Telephone Encounter (Signed)
 Copied From CRM [id number]. Reason for Triage: patient called and stated that she believes she may have a UTI and has had sxs for 3 days. I informed her that she will need an appt to have a urine test done for the provider to treat her sxs appropriately. Patient was upset because she stated she can't move out her bed and is unable to come in. Patient then disconnected the call. Please follow-up and advise.   Chief Complaint: frequency, burning urination Symptoms: see above Frequency: 3 days Pertinent Negatives: Patient denies NA; declined triage;  Disposition: [] ED /[] Urgent Care (no appt availability in office) / [x] Appointment(In office/virtual)/ []  [place] [facility]/ [] Home Care/ [] Refused Recommended Disposition /[] [place] [facility]/ []  Follow-up with PCP Additional Notes: virtual apt made today per request; patient recently had foot surgery and is unable to go out of house.  States uti symptoms x 3 day; declined triage.  Care advice given, denies questions; instructed to go to ER if becomes worse.

## 2023-10-25 DIAGNOSIS — S92255A Nondisplaced fracture of navicular [scaphoid] of left foot, initial encounter for closed fracture: Secondary | ICD-10-CM | POA: Diagnosis not present

## 2023-10-25 DIAGNOSIS — M19072 Primary osteoarthritis, left ankle and foot: Secondary | ICD-10-CM | POA: Diagnosis not present

## 2023-11-11 ENCOUNTER — Encounter: Payer: Medicare HMO | Admitting: Licensed Clinical Social Worker

## 2023-11-15 DIAGNOSIS — S92255A Nondisplaced fracture of navicular [scaphoid] of left foot, initial encounter for closed fracture: Secondary | ICD-10-CM | POA: Diagnosis not present

## 2023-11-15 DIAGNOSIS — M19072 Primary osteoarthritis, left ankle and foot: Secondary | ICD-10-CM | POA: Diagnosis not present

## 2023-11-17 ENCOUNTER — Other Ambulatory Visit: Payer: Self-pay | Admitting: Family Medicine

## 2023-11-17 DIAGNOSIS — G2581 Restless legs syndrome: Secondary | ICD-10-CM

## 2023-12-15 LAB — HM DIABETES EYE EXAM

## 2023-12-16 ENCOUNTER — Encounter: Payer: Self-pay | Admitting: Obstetrics and Gynecology

## 2023-12-16 ENCOUNTER — Ambulatory Visit: Admitting: Obstetrics and Gynecology

## 2023-12-16 ENCOUNTER — Encounter: Payer: Self-pay | Admitting: Family Medicine

## 2023-12-16 VITALS — BP 155/75 | HR 71

## 2023-12-16 DIAGNOSIS — M6702 Short Achilles tendon (acquired), left ankle: Secondary | ICD-10-CM | POA: Diagnosis not present

## 2023-12-16 DIAGNOSIS — Z87440 Personal history of urinary (tract) infections: Secondary | ICD-10-CM | POA: Diagnosis not present

## 2023-12-16 DIAGNOSIS — N3281 Overactive bladder: Secondary | ICD-10-CM

## 2023-12-16 DIAGNOSIS — M19072 Primary osteoarthritis, left ankle and foot: Secondary | ICD-10-CM | POA: Diagnosis not present

## 2023-12-16 DIAGNOSIS — N390 Urinary tract infection, site not specified: Secondary | ICD-10-CM

## 2023-12-16 DIAGNOSIS — S92255D Nondisplaced fracture of navicular [scaphoid] of left foot, subsequent encounter for fracture with routine healing: Secondary | ICD-10-CM | POA: Diagnosis not present

## 2023-12-16 MED ORDER — MIRABEGRON ER 50 MG PO TB24: 50.0000 mg | ORAL_TABLET | Freq: Every day | ORAL | 3 refills | Status: AC

## 2023-12-16 NOTE — Patient Instructions (Signed)
 Try Myrbetriq  50mg  instead Gemtesa  if it will be less expensive. Call our office if you have any issues getting the medication. Can follow up in 3 months to see how it is working for you.

## 2023-12-16 NOTE — Progress Notes (Signed)
 [facility] Return Visit  SUBJECTIVE  History of Present Illness: Heather [name] is a 89 y.o. female seen in follow-up for overactive bladder and recurrent UTI.   Last UTI was in Feb. That was the first one in about 6 months. No current symptoms. Still using estrace  cream twice a week.   Gemtesa  has been working well. Not leaking much at all. Waking up rarely at night. Sometimes she takes it every other day because she is paying about $400 a month (cost went up with her insurance).   Has not had any urinary tract infections. She is using estrogen when she remembers.   s/p Exam under anesthesia, posterior repair, sacrospinous ligament fixation, midurethral sling and cystoscopy on 10/27/20 as well as a sling release on 11/09/20  Past Medical History: Patient  has a past medical history of Aortic atherosclerosis (HCC) (10/12/2021), Cancer (HCC), Cataract, Chronic heart failure with preserved ejection fraction (HFpEF) (HCC), Chronic stable angina (HCC), Coronary artery disease (cardiologist--- dr Arlester Ladd), Diabetes mellitus type 2, diet-controlled (HCC), DOE (dyspnea on exertion), Echocardiogram 08/2020, Edema of right lower extremity, GERD (gastroesophageal reflux disease), Hiatal hernia, History of cervical cancer, History of DVT of lower extremity (2016), History of esophageal stricture, History of gastric ulcer (2005 approx.), History of palpitations (2010), History of TIA (transient ischemic attack) (06/1999), Hypertension, Intermittent palpitations, IT band syndrome, Migraine, Mixed hyperlipidemia, Mixed incontinence urge and stress, Multiple thyroid  nodules, OA (osteoarthritis), Occasional tremors, Osteoporosis, Peroneal DVT (deep venous thrombosis) (HCC) (12/22/2014), Right bundle branch block (RBBB) with left anterior fascicular block (LAFB), RLS (restless legs syndrome), S/P drug eluting coronary stent placement (2006), Stroke [facility]), and Urinary retention.   Past Surgical  History: She  has a past surgical history that includes Knee arthroscopy (Bilateral, right ?/   left x2 , last one 09-12-2009 @ [facility]); Foot surgery (Left, 1990s); Upper gastrointestinal endoscopy (last one 04-25-2017); Total knee arthroplasty (11/12/2011); Total knee arthroplasty (Right, 11/29/2014); Colonoscopy (last one ?); Cataract extraction w/ intraocular lens  implant, bilateral (2015); Hiatal hernia repair (1989); Anterior and posterior repair (N/A, 12/22/2019); Vaginal hysterectomy (1988); Coronary angioplasty with stent (03-16-2005   dr Judy Null); Coronary angioplasty with stent (06-27-2005  dr Judy Null); Rectocele repair (N/A, 04/20/2020); Rectocele repair (N/A, 10/27/2020); Anterior and posterior repair with sacrospinous fixation (N/A, 10/27/2020); Bladder suspension (N/A, 10/27/2020); Cystoscopy (N/A, 10/27/2020); Pubovaginal sling (N/A, 11/09/2020); Cystoscopy (N/A, 11/09/2020); Cholecystectomy (N/A, 01/25/2021); Incisional hernia repair (N/A, 01/25/2021); Inguinal hernia repair (Left, 04/17/2021); Joint replacement; Small intestine surgery; Eye surgery; Fracture surgery; Hernia repair; Ventral hernia repair (N/A, 02/11/2023); Lysis of adhesion (N/A, 02/11/2023); Esophagogastroduodenoscopy (egd) with propofol  (N/A, 08/12/2023); biopsy (08/12/2023); EUS (N/A, 10/03/2023); and Esophagogastroduodenoscopy (N/A, 10/03/2023).   Medications: She has a current medication list which includes the following prescription(s): acetaminophen , amlodipine , aspirin  ec, true metrix air glucose meter, diclofenac  sodium, estradiol , furosemide , true metrix blood glucose test, optase comfort dry eye, isosorbide  mononitrate, losartan , magnesium  oxide, mirabegron  er, multiple vitamins-minerals, olopatadine  hcl, omeprazole , ondansetron , pramipexole , simvastatin , trueplus lancets 33g, and vibegron .   Allergies: Patient is allergic to baclofen , gabapentin , and nitrofurantoin.   Social History: Patient  reports that she has never  smoked. She has never used smokeless tobacco. She reports that she does not drink alcohol  and does not use drugs.      OBJECTIVE     Physical Exam: Vitals:   12/16/23 0825  BP: (!) 155/75  Pulse: 71    Gen: No apparent distress, A&O x 3.  Detailed Urogynecologic Evaluation:  Deferred.    ASSESSMENT AND PLAN  Heather [name] is a 89 y.o. with:  1. Overactive bladder   2. Recurrent urinary tract infection      - For rUTI, continue vaginal estrace  cream 0.5g twice weekly. - Will try Myrbetriq  50mg  as an alternative to Gemtesa  since it is costly. She has previously taken Oxybutynin. Had taken myrbetriq  for a short time a few years ago with samples but then it was too costly.   Return 3 months for med follow up.   Arma Lamp, MD

## 2024-01-29 ENCOUNTER — Ambulatory Visit (INDEPENDENT_AMBULATORY_CARE_PROVIDER_SITE_OTHER)

## 2024-01-29 VITALS — BP 154/70 | Ht 62.0 in | Wt 135.6 lb

## 2024-01-29 DIAGNOSIS — E1121 Type 2 diabetes mellitus with diabetic nephropathy: Secondary | ICD-10-CM

## 2024-01-29 DIAGNOSIS — Z0000 Encounter for general adult medical examination without abnormal findings: Secondary | ICD-10-CM

## 2024-01-29 DIAGNOSIS — Z2821 Immunization not carried out because of patient refusal: Secondary | ICD-10-CM

## 2024-01-29 MED ORDER — TRUE METRIX BLOOD GLUCOSE TEST VI STRP: ORAL_STRIP | 12 refills | Status: AC

## 2024-01-29 NOTE — Patient Instructions (Signed)
 Heather [name] , Thank you for taking time out of your busy schedule to complete your Annual Wellness Visit with me. I enjoyed our conversation and look forward to speaking with you again next year. I, as well as your care team,  appreciate your ongoing commitment to your health goals. Please review the following plan we discussed and let me know if I can assist you in the future. Your Game plan/ To Do List    Referrals: If you haven't heard from the office you've been referred to, please reach out to them at the phone provided.  none Follow up Visits: Next Medicare AWV with our clinical staff: 02/03/2025   Have you seen your provider in the last 6 months (3 months if uncontrolled diabetes)? No Next Office Visit with your provider: none  Clinician Recommendations:  Aim for 30 minutes of exercise or brisk walking, 6-8 glasses of water, and 5 servings of fruits and vegetables each day.       This is a list of the screening recommended for you and due dates:  Health Maintenance  Topic Date Due   Complete foot exam   06/18/2017   COVID-19 Vaccine (7 - 2024-25 season) 07/09/2023   Hemoglobin A1C  02/02/2024   Flu Shot  02/07/2024   Eye exam for diabetics  12/14/2024   Medicare Annual Wellness Visit  01/28/2025   DTaP/Tdap/Td vaccine (3 - Td or Tdap) 02/22/2033   Pneumococcal Vaccine for age over 32  Completed   DEXA scan (bone density measurement)  Completed   Zoster (Shingles) Vaccine  Completed   Hepatitis B Vaccine  Aged Out   HPV Vaccine  Aged Out   Meningitis B Vaccine  Aged Out    Advanced directives: (Declined) Advance directive discussed with you today. Even though you declined this today, please call our office should you change your mind, and we can give you the proper paperwork for you to fill out. Advance Care Planning is important because it:  [x]  Makes sure you receive the medical care that is consistent with your values, goals, and preferences  [x]  It provides guidance to  your family and loved ones and reduces their decisional burden about whether or not they are making the right decisions based on your wishes.  Follow the link provided in your after visit summary or read over the paperwork we have mailed to you to help you started getting your Advance Directives in place. If you need assistance in completing these, please reach out to us  so that we can help you!  See attachments for Preventive Care and Fall Prevention Tips.

## 2024-01-29 NOTE — Progress Notes (Signed)
 Because this visit was a virtual/telehealth visit,  certain criteria was not obtained, such a blood pressure, CBG if applicable, and timed get up and go. Any medications not marked as taking were not mentioned during the medication reconciliation part of the visit. Any vitals not documented were not able to be obtained due to this being a telehealth visit or patient was unable to self-report a recent blood pressure reading due to a lack of equipment at home via telehealth. Vitals that have been documented are verbally provided by the patient.  This visit was performed by a medical professional under my direct supervision. I was immediately available for consultation/collaboration. I have reviewed and agree with the Annual Wellness Visit documentation.  Subjective:   NOHELIA VALENZA is a 89 y.o. who presents for a Medicare Wellness preventive visit.  As a reminder, Annual Wellness Visits don't include a physical exam, and some assessments may be limited, especially if this visit is performed virtually. We may recommend an in-person follow-up visit with your provider if needed.  Visit Complete: Virtual I connected with  Roderick CHRISTELLA Silversmith on 01/29/24 by a audio enabled telemedicine application and verified that I am speaking with the correct person using two identifiers.  Patient Location: Home  Provider Location: Home Office  I discussed the limitations of evaluation and management by telemedicine. The patient expressed understanding and agreed to proceed.  Vital Signs: Because this visit was a virtual/telehealth visit, some criteria may be missing or patient reported. Any vitals not documented were not able to be obtained and vitals that have been documented are patient reported.  VideoDeclined- This patient declined Librarian, academic. Therefore the visit was completed with audio only.  Persons Participating in Visit: Patient.  AWV Questionnaire: No: Patient Medicare  AWV questionnaire was not completed prior to this visit.  Cardiac Risk Factors include: advanced age (>49men, >15 women);hypertension;diabetes mellitus;sedentary lifestyle     Objective:    Today's Vitals   01/29/24 1026  BP: (!) 154/70  Weight: 135 lb 9.6 oz (61.5 kg)  Height: 5' 2 (1.575 m)   Body mass index is 24.8 kg/m.     01/29/2024   10:26 AM 10/03/2023    1:46 PM 08/09/2023   11:00 PM 03/08/2023    3:14 PM 02/23/2023    8:48 PM 02/14/2023    8:31 AM 02/13/2023    7:13 PM  Advanced Directives  Does Patient Have a Medical Advance Directive? Yes Yes No Yes Yes Yes Yes  Type of Estate agent of [place];Living will Healthcare Power of [place];Living will  Healthcare Power of [place];Living will Healthcare Power of [place];Living will Healthcare Power of [place];Living will   Does patient want to make changes to medical advance directive? No - Patient declined   No - Patient declined No - Patient declined No - Patient declined No - Patient declined  Copy of Healthcare Power of Attorney in Chart? Yes - validated most recent copy scanned in chart (See row information) No - copy requested  No - copy requested Yes - validated most recent copy scanned in chart (See row information) Yes - validated most recent copy scanned in chart (See row information) No - copy requested, Physician notified  Would patient like information on creating a medical advance directive?   No - Patient declined        Current Medications (verified) Outpatient Encounter Medications as of 01/29/2024  Medication Sig   acetaminophen  (TYLENOL ) 500 MG tablet Take 1,000 mg  by mouth every 6 (six) hours as needed for moderate pain.   amLODipine  (NORVASC ) 5 MG tablet Take 1 tablet (5 mg total) by mouth daily.   aspirin  EC 81 MG tablet Take 1 tablet (81 mg total) by mouth daily.   Blood Glucose Monitoring Suppl (TRUE METRIX AIR GLUCOSE METER) w/Device KIT USE TO CHECK BLOOD SUGAR ONCE DAILY AND AS  NEEDED.  DX CODE E11.9   diclofenac  Sodium (VOLTAREN ) 1 % GEL Apply 1 application topically 4 (four) times daily as needed (hip pain).   estradiol  (ESTRACE ) 0.1 MG/GM vaginal cream Place 0.5 g vaginally 2 (two) times a week. Place 0.5g nightly for two weeks then twice a week after   furosemide  (LASIX ) 20 MG tablet Take 20 mg by mouth as needed for fluid.   Glycerin, PF, (OPTASE COMFORT DRY EYE) 1 % SOLN Place 1 drop into both eyes in the morning and at bedtime.   isosorbide  mononitrate (IMDUR ) 60 MG 24 hr tablet TAKE 1 AND 1/2 TABLETS EVERY DAY   losartan  (COZAAR ) 100 MG tablet TAKE 1 TABLET EVERY DAY (Patient taking differently: Take 50 mg by mouth 2 (two) times daily.)   Magnesium  Oxide 250 MG TABS Take 250 mg by mouth every morning.   mirabegron  ER (MYRBETRIQ ) 50 MG TB24 tablet Take 1 tablet (50 mg total) by mouth daily.   Multiple Vitamins-Minerals (ZINC  PO) Take 50 mg by mouth daily. with Vitamin D  2000IU capsule daily   Olopatadine  HCl (PATADAY ) 0.2 % SOLN Place 1 drop into both eyes daily.   omeprazole  (PRILOSEC) 20 MG capsule TAKE 1 CAPSULE EVERY DAY AS NEEDED (NEED OFFICE VISIT BEFORE ANY FURTHER REFILLS)   pramipexole  (MIRAPEX ) 0.25 MG tablet TAKE 1 TABLET TWICE DAILY   simvastatin  (ZOCOR ) 20 MG tablet TAKE 1 TABLET AT BEDTIME   TRUEplus Lancets 33G MISC USE TO CHECK BLOOD SUGAR ONCE DAILY AND AS NEEDED.  DX CODE: E11.9   Vibegron  75 MG TABS Take 1 tablet (75 mg total) by mouth daily. (Patient taking differently: Take 1 tablet by mouth daily as needed (Urinary Issues).)   [DISCONTINUED] glucose blood (TRUE METRIX BLOOD GLUCOSE TEST) test strip TEST BLOOD SUGAR EVERY DAY  OR AS NEEDED   glucose blood (TRUE METRIX BLOOD GLUCOSE TEST) test strip TEST BLOOD SUGAR EVERY DAY  OR AS NEEDED   ondansetron  (ZOFRAN ) 4 MG tablet Take 1 tablet (4 mg total) by mouth every 8 (eight) hours as needed for nausea or vomiting.   No facility-administered encounter medications on file as of 01/29/2024.     Allergies (verified) Baclofen , Gabapentin , and Nitrofurantoin   History: Past Medical History:  Diagnosis Date   Aortic atherosclerosis (HCC) 10/12/2021   Cancer (HCC)    Uterine   Cataract    Chronic heart failure with preserved ejection fraction (HFpEF) (HCC)    Chronic stable angina (HCC)    Coronary artery disease cardiologist--- dr wonda   Diabetes mellitus type 2, diet-controlled (HCC)    followed by pcp  (10-19-2020  pt stated checks daily in am,  fasting blood surgar--- 115--120s)   DOE (dyspnea on exertion)    per pt when I over do,  ok with household chores   Echocardiogram 08/2020    Echocardiogram 2/22: EF 55-60, no RWMA, mild LVH, Gr 2 DD, GLS-21.7%, normal RVSF, trivial MR, RVSP 39.5   Edema of right lower extremity    GERD (gastroesophageal reflux disease)    Hiatal hernia    recurrence,  hx HH repair 1989  History of cervical cancer    s/p  vaginal hysterectomy   History of DVT of lower extremity 2016   11-29-2014 post op right TKA of right lower extremity and completed xarelto     History of esophageal stricture    hx s/p dilatation's   History of gastric ulcer 2005 approx.   History of palpitations 2010   event monitor 07-07-2009 showed NSR w/ freq. SVT ectopies with short runs, rare PVCs   History of TIA (transient ischemic attack) 06/1999   12-15-2019  per pt had several TIA between 12/ 2000 to 02/ 2001 , was sent to specialist @[facility] , had test that was normal (10-19-2020 pt stated no TIAs since ) but has residual of essential tremors of right arm/ hand   Hypertension    Intermittent palpitations    IT band syndrome    Migraine    ice pick headche lasts about 30 seconds   Mixed hyperlipidemia    Mixed incontinence urge and stress    Multiple thyroid  nodules    followed by pcp---   ultrasound 11-22-2014 no bx   (12-15-2019 per pt had a endocrinologist and was told did not need bx)   OA (osteoarthritis)    knees, elbow, hip, ankles   Occasional  tremors    right arm/ hand  s/p TIA residual 2000   Osteoporosis    taking vitamin d    Peroneal DVT (deep venous thrombosis) (HCC) 12/22/2014   Right bundle branch block (RBBB) with left anterior fascicular block (LAFB)    RLS (restless legs syndrome)    S/P drug eluting coronary stent placement 2006   03-16-2005  PCI x1 DES to LCx;   06-27-2005  PCI x1 DES to RCA   Stroke [facility])    TIA's   Urinary retention    post op sling prodecure on 10-27-2020, has foley cathether   Past Surgical History:  Procedure Laterality Date   ANTERIOR AND POSTERIOR REPAIR N/A 12/22/2019   Procedure: ANTERIOR (CYSTOCELE)  REPAIR;  Surgeon: Danielle Rom, MD;  Location: [facility];  Service: Gynecology;  Laterality: N/A;   ANTERIOR AND POSTERIOR REPAIR WITH SACROSPINOUS FIXATION N/A 10/27/2020   Procedure: SACROSPINOUS LIGAMENT FIXATION;  Surgeon: Marilynne Rosaline SAILOR, MD;  Location: [facility];  Service: Gynecology;  Laterality: N/A;   BIOPSY  08/12/2023   Procedure: BIOPSY;  Surgeon: Suzann Inocente HERO, MD;  Location: [facility];  Service: Gastroenterology;;   BLADDER SUSPENSION N/A 10/27/2020   Procedure: TRANSVAGINAL TAPE (TVT) PROCEDURE;  Surgeon: Marilynne Rosaline SAILOR, MD;  Location: [facility];  Service: Gynecology;  Laterality: N/A;   CATARACT EXTRACTION W/ INTRAOCULAR LENS  IMPLANT, BILATERAL  2015   CHOLECYSTECTOMY N/A 01/25/2021   Procedure: LAPAROSCOPIC CHOLECYSTECTOMY WITH INTRAOPERATIVE CHOLANGIOGRAM AND LYSIS OF ADHESIONS;  Surgeon: Sheldon Standing, MD;  Location: [facility];  Service: General;  Laterality: N/A;   COLONOSCOPY  last one ?   CORONARY ANGIOPLASTY WITH STENT PLACEMENT  03-16-2005   dr morris   PCI and DES x1 to LCx   CORONARY ANGIOPLASTY WITH STENT PLACEMENT  06-27-2005  dr morris   PCI and DES x1 to RCA with residual disease LAD 70-80% to manage medically   CYSTOSCOPY N/A 10/27/2020   Procedure: CYSTOSCOPY;  Surgeon: Marilynne Rosaline SAILOR, MD;  Location: [facility];  Service: Gynecology;  Laterality: N/A;   CYSTOSCOPY N/A 11/09/2020   Procedure: CYSTOSCOPY;  Surgeon: Marilynne Rosaline SAILOR, MD;  Location: [facility];  Service: Gynecology;  Laterality: N/A;   ESOPHAGOGASTRODUODENOSCOPY N/A 10/03/2023   Procedure: EGD (ESOPHAGOGASTRODUODENOSCOPY);  Surgeon: Wilhelmenia Aloha Raddle., MD;  Location: [facility];  Service: Gastroenterology;  Laterality: N/A;   ESOPHAGOGASTRODUODENOSCOPY (EGD) WITH PROPOFOL  N/A 08/12/2023   Procedure: ESOPHAGOGASTRODUODENOSCOPY (EGD) WITH PROPOFOL ;  Surgeon: Suzann Inocente HERO, MD;  Location: [facility] ENDOSCOPY;  Service: Gastroenterology;  Laterality: N/A;   EUS N/A 10/03/2023   Procedure: UPPER ENDOSCOPIC ULTRASOUND (EUS) RADIAL;  Surgeon: Wilhelmenia Aloha Raddle., MD;  Location[facility];  Service: Gastroenterology;  Laterality: N/A;   EYE SURGERY     FOOT SURGERY Left 1990s   left foot stress fracture repair, per pt no hardware   FRACTURE SURGERY     HERNIA REPAIR     HIATAL HERNIA REPAIR  1989   INCISIONAL HERNIA REPAIR N/A 01/25/2021   Procedure: PRIMARY REPAIR OF INCISIONAL HERNIA;  Surgeon: Sheldon Standing, MD;  Location: [facility];  Service: General;  Laterality: N/A;   INGUINAL HERNIA REPAIR Left 04/17/2021   Procedure: LAPAROSCOPIC LEFT INGUINAL HERNIA REPAIR WITH MESH;  Surgeon: Rubin Calamity, MD;  Location: [facility];  Service: General;  Laterality: Left;   JOINT REPLACEMENT     KNEE ARTHROSCOPY Bilateral right ?/   left x2 , last one 09-12-2009 @ [facility]   LYSIS OF ADHESION N/A 02/11/2023   Procedure: LYSIS OF ADHESION;  Surgeon: Rubin Calamity, MD;  Location: [facility] OR;  Service: General;  Laterality: N/A;   PUBOVAGINAL SLING N/A 11/09/2020   Procedure: REVISION OF CARLOYN GLADE;  Surgeon: Marilynne Rosaline SAILOR, MD;  Location: [facility];  Service: Gynecology;  Laterality: N/A;   RECTOCELE REPAIR N/A 04/20/2020   Procedure: POSTERIOR REPAIR  (RECTOCELE);  Surgeon: Danielle Rom, MD;  Location: [facility];  Service: Gynecology;  Laterality: N/A;   RECTOCELE REPAIR N/A 10/27/2020   Procedure: POSTERIOR REPAIR (RECTOCELE);  Surgeon: Marilynne Rosaline SAILOR, MD;  Location: [facility];  Service: Gynecology;  Laterality: N/A;  total time requested for all procedures is 2 hours   SMALL INTESTINE SURGERY     TOTAL KNEE ARTHROPLASTY  11/12/2011   Procedure: TOTAL KNEE ARTHROPLASTY;  Surgeon: Lamar DELENA Millman, MD;  Location: [facility];  Service: Orthopedics;  Laterality: Left;  Dr Millman wants 90 minutes for this case   TOTAL KNEE ARTHROPLASTY Right 11/29/2014   Procedure: RIGHT TOTAL KNEE ARTHROPLASTY;  Surgeon: Marcey Raman, MD;  Location: [facility];  Service: Orthopedics;  Laterality: Right;   UPPER GASTROINTESTINAL ENDOSCOPY  last one 04-25-2017   with dilatation esophageal stricture and savary dilatation   VAGINAL HYSTERECTOMY  1988    no ovaries removed for bleeeding   VENTRAL HERNIA REPAIR N/A 02/11/2023   Procedure: LAPAROSCOPIC ATTEMPTED OPEN VENTRAL HERNIA REPAIR WITH MESH;  Surgeon: Rubin Calamity, MD;  Location: [facility] OR;  Service: General;  Laterality: N/A;  2.5 HRS   Family History  Problem Relation Age of Onset   Stroke Father        family hx of M 1st degree relative <50   Heart disease Father    Coronary artery disease Mother    Heart disease Mother    Arthritis Mother    Hearing loss Mother    Vision loss Mother    Depression Brother    Cancer Brother    Stroke Brother    Diabetes Brother    Cancer Brother        bladder with mets   Diabetes Daughter        borderline   Hypertension  Daughter    Arthritis Other        family hx of   Hypertension Other        family hx of   Other Other        family hx of cardiovascular disorder   Thyroid  disease Daughter    Early death Daughter    Breast cancer Neg Hx    Colon cancer Neg Hx    Anesthesia problems Neg Hx    Hypotension Neg Hx     Malignant hyperthermia Neg Hx    Pseudochol deficiency Neg Hx    Colon polyps Neg Hx    Esophageal cancer Neg Hx    Rectal cancer Neg Hx    Stomach cancer Neg Hx    Social History   Socioeconomic History   Marital status: Widowed    Spouse name: Not on file   Number of children: 3   Years of education: 57   Highest education level: Some college, no degree  Occupational History   Occupation: works partime in Public affairs consultant: RETIRED    Comment: retired  Tobacco Use   Smoking status: Never   Smokeless tobacco: Never  Vaping Use   Vaping status: Never Used  Substance and Sexual Activity   Alcohol  use: No    Alcohol /week: 0.0 standard drinks of alcohol    Drug use: Never   Sexual activity: Not Currently    Birth control/protection: Surgical    Comment: lives alone  Other Topics Concern   Not on file  Social History Narrative   Lives with husband, Caffeine use- Half Caffeine)- 2 cups daily.  3 children living, one passed away.   Education: HS. Business college.  Retired.    Social Drivers of Corporate investment banker Strain: Low Risk  (01/29/2024)   Overall Financial Resource Strain (CARDIA)    Difficulty of Paying Living Expenses: Not hard at all  Food Insecurity: No Food Insecurity (01/29/2024)   Hunger Vital Sign    Worried About Running Out of Food in the Last Year: Never true    Ran Out of Food in the Last Year: Never true  Transportation Needs: No Transportation Needs (01/29/2024)   PRAPARE - Administrator, Civil Service (Medical): No    Lack of Transportation (Non-Medical): No  Physical Activity: Insufficiently Active (01/29/2024)   Exercise Vital Sign    Days of Exercise per Week: 2 days    Minutes of Exercise per Session: 20 min  Stress: Stress Concern Present (09/04/2023)   [employer] of Occupational Health - Occupational Stress Questionnaire    Feeling of Stress : Rather much  Social Connections: Moderately Isolated (01/29/2024)    Social Connection and Isolation Panel    Frequency of Communication with Friends and Family: More than three times a week    Frequency of Social Gatherings with Friends and Family: Three times a week    Attends Religious Services: More than 4 times per year    Active Member of Clubs or Organizations: No    Attends Banker Meetings: Never    Marital Status: Widowed    Tobacco Counseling Counseling given: Not Answered    Clinical Intake:  Pre-visit preparation completed: Yes  Pain : No/denies pain     BMI - recorded: 24.8 Nutritional Status: BMI of 19-24  Normal Nutritional Risks: None Diabetes: No  Lab Results  Component Value Date   HGBA1C 6.8 (H) 08/05/2023   HGBA1C 6.7 (H) 02/05/2023  HGBA1C 7.1 (H) 03/22/2022     How often do you need to have someone help you when you read instructions, pamphlets, or other written materials from your doctor or pharmacy?: 1 - Never  Interpreter Needed?: No  Information entered by :: Lyle Anderson LATHER   Activities of Daily Living     01/29/2024   10:31 AM 08/09/2023    2:53 PM  In your present state of health, do you have any difficulty performing the following activities:  Hearing? 0 1  Vision? 0 0  Difficulty concentrating or making decisions? 0 0  Walking or climbing stairs? 0   Dressing or bathing? 0   Doing errands, shopping? 0 1  Preparing Food and eating ? N   Using the Toilet? N   In the past six months, have you accidently leaked urine? N   Do you have problems with loss of bowel control? N   Managing your Medications? N   Managing your Finances? N   Housekeeping or managing your Housekeeping? N     Patient Care Team: Domenica Harlene LABOR, MD as PCP - General (Family Medicine) Wonda Sharper, MD as PCP - Cardiology (Cardiology) Laurice Francis NOVAK, OD (Optometry) Danielle Rom, MD as Consulting Physician (Obstetrics and Gynecology) Gershon Donnice SAUNDERS, DPM as Consulting Physician  (Podiatry) Lelon Glendia ONEIDA DEVONNA as Physician Assistant (Cardiology) Carla Milling, RPH-CPP (Pharmacist) Sheldon Standing, MD as Consulting Physician (General Surgery) Marilynne Rosaline SAILOR, MD as Consulting Physician (Gynecology) Armbruster, Standing SQUIBB, MD as Consulting Physician (Gastroenterology)  I have updated your Care Teams any recent Medical Services you may have received from other providers in the past year.     Assessment:   This is a routine wellness examination for Arisbeth.  Hearing/Vision screen Hearing Screening - Comments:: Some difficulties but no hearing aids Vision Screening - Comments:: Patient wears readers   Goals Addressed             This Visit's Progress    Patient Stated       Get her fractured foot healed       Depression Screen     01/29/2024   10:32 AM 09/04/2023   10:33 AM 07/01/2023    3:47 PM 05/20/2023    1:13 PM 04/02/2023   11:38 AM 04/01/2023   10:58 AM 03/06/2023   10:49 AM  PHQ 2/9 Scores  PHQ - 2 Score 1 2 2 2  0 2 2  PHQ- 9 Score 1 7 5 5  8 8     Fall Risk     01/29/2024   10:29 AM 04/02/2023   11:37 AM 12/07/2022    8:59 AM 03/22/2022   11:34 AM 11/28/2021    2:11 PM  Fall Risk   Falls in the past year? 1 1 0  0  Number falls in past yr: 1 0 0 0 0  Injury with Fall? 1 1 0 0 0  Risk for fall due to : History of fall(s);Impaired balance/gait;Impaired mobility  No Fall Risks  Impaired vision  Follow up Falls evaluation completed;Education provided Falls evaluation completed Falls evaluation completed Falls evaluation completed  Falls prevention discussed      Data saved with a previous flowsheet row definition    MEDICARE RISK AT HOME:  Medicare Risk at Home Any stairs in or around the home?: No If so, are there any without handrails?: No Home free of loose throw rugs in walkways, pet beds, electrical cords, etc?: Yes Adequate lighting in your home to reduce  risk of falls?: Yes Life alert?: Yes Use of a cane, walker or w/c?:  Yes Grab bars in the bathroom?: Yes Shower chair or bench in shower?: Yes Elevated toilet seat or a handicapped toilet?: Yes  TIMED UP AND GO:  Was the test performed?  No  Cognitive Function: 6CIT completed    06/18/2016   10:31 AM  MMSE - Mini Mental State Exam  Orientation to time 5   Orientation to Place 5   Registration 3   Attention/ Calculation 5   Recall 2   Language- name 2 objects 2   Language- repeat 1  Language- follow 3 step command 3   Language- read & follow direction 1   Write a sentence 1   Copy design 1   Total score 29      Data saved with a previous flowsheet row definition        12/12/2022    3:22 PM 11/28/2021    2:12 PM  6CIT Screen  What Year? 0 points 0 points  What month? 0 points 0 points  What time? 0 points 0 points  Count back from 20 0 points 0 points  Months in reverse 0 points 0 points  Repeat phrase 0 points 0 points  Total Score 0 points 0 points    Immunizations Immunization History  Administered Date(s) Administered   Fluad Quad(high Dose 65+) 04/18/2021, 03/22/2022   Fluad Trivalent(High Dose 65+) 04/02/2023   Influenza Whole 06/08/2005, 06/29/2010   Influenza, High Dose Seasonal PF 04/05/2015, 06/18/2016, 05/27/2017, 05/30/2018   Influenza, Quadrivalent, Recombinant, Inj, Pf 04/22/2019   Influenza,inj,Quad PF,6+ Mos 04/30/2014   Influenza,inj,quad, With Preservative 05/06/2020   Moderna Covid-19 Fall Seasonal Vaccine 66yrs & older 04/17/2022   Moderna Covid-19 Vaccine Bivalent Booster 52yrs & up 05/14/2023   Moderna Sars-Covid-2 Vaccination 07/21/2019, 08/18/2019, 04/17/2022   PFIZER(Purple Top)SARS-COV-2 Vaccination 05/06/2020   Pfizer Covid-19 Vaccine Bivalent Booster 53yrs & up 05/03/2021   Pneumococcal Conjugate-13 11/17/2013   Pneumococcal Polysaccharide-23 07/09/2000, 05/27/2017   Respiratory Syncytial Virus Vaccine,Recomb Aduvanted(Arexvy) 04/17/2022   Tdap 05/24/2015, 02/23/2023   Zoster Recombinant(Shingrix)  05/03/2021, 07/26/2021    Screening Tests Health Maintenance  Topic Date Due   FOOT EXAM  06/18/2017   COVID-19 Vaccine (7 - 2024-25 season) 07/09/2023   HEMOGLOBIN A1C  02/02/2024   INFLUENZA VACCINE  02/07/2024   OPHTHALMOLOGY EXAM  12/14/2024   Medicare Annual Wellness (AWV)  01/28/2025   DTaP/Tdap/Td (3 - Td or Tdap) 02/22/2033   Pneumococcal Vaccine: 50+ Years  Completed   DEXA SCAN  Completed   Zoster Vaccines- Shingrix  Completed   Hepatitis B Vaccines  Aged Out   HPV VACCINES  Aged Out   Meningococcal B Vaccine  Aged Out    Health Maintenance  Health Maintenance Due  Topic Date Due   FOOT EXAM  06/18/2017   COVID-19 Vaccine (7 - 2024-25 season) 07/09/2023   Health Maintenance Items Addressed:   Additional Screening:  Vision Screening: Recommended annual ophthalmology exams for early detection of glaucoma and other disorders of the eye. Would you like a referral to an eye doctor? No    Dental Screening: Recommended annual dental exams for proper oral hygiene  Community Resource Referral / Chronic Care Management: CRR required this visit?  No   CCM required this visit?  No   Plan:    I have personally reviewed and noted the following in the patient's chart:   Medical and social history Use of alcohol , tobacco or illicit drugs  Current medications and supplements including opioid prescriptions. Patient is not currently taking opioid prescriptions. Functional ability and status Nutritional status Physical activity Advanced directives List of other physicians Hospitalizations, surgeries, and ER visits in previous 12 months Vitals Screenings to include cognitive, depression, and falls Referrals and appointments  In addition, I have reviewed and discussed with patient certain preventive protocols, quality metrics, and best practice recommendations. A written personalized care plan for preventive services as well as general preventive health  recommendations were provided to patient.   Lyle MARLA Right, [place]   01/29/2024   After Visit Summary: (MyChart) Due to this being a telephonic visit, the after visit summary with patients personalized plan was offered to patient via MyChart   Notes: Nothing significant to report at this time.

## 2024-02-03 ENCOUNTER — Ambulatory Visit (INDEPENDENT_AMBULATORY_CARE_PROVIDER_SITE_OTHER)

## 2024-02-03 ENCOUNTER — Other Ambulatory Visit (HOSPITAL_COMMUNITY)
Admission: RE | Admit: 2024-02-03 | Discharge: 2024-02-03 | Disposition: A | Discharge status: Home / Self Care | Source: Other Acute Inpatient Hospital | Attending: Obstetrics and Gynecology | Admitting: Obstetrics and Gynecology

## 2024-02-03 VITALS — BP 157/77 | HR 74 | Temp 98.2°F

## 2024-02-03 DIAGNOSIS — R82998 Other abnormal findings in urine: Secondary | ICD-10-CM | POA: Insufficient documentation

## 2024-02-03 DIAGNOSIS — R319 Hematuria, unspecified: Secondary | ICD-10-CM

## 2024-02-03 DIAGNOSIS — R300 Dysuria: Secondary | ICD-10-CM

## 2024-02-03 LAB — POCT URINALYSIS DIP (CLINITEK)
Bilirubin, UA: NEGATIVE
Glucose, UA: NEGATIVE mg/dL
Ketones, POC UA: NEGATIVE mg/dL
Nitrite, UA: NEGATIVE
POC PROTEIN,UA: NEGATIVE
Spec Grav, UA: <=1.005 — AB (ref 1.010–1.025)
Urobilinogen, UA: 0.2 U/dL
pH, UA: 6 (ref 5.0–8.0)

## 2024-02-03 MED ORDER — PHENAZOPYRIDINE HCL 200 MG PO TABS: 200.0000 mg | ORAL_TABLET | Freq: Three times a day (TID) | ORAL | 0 refills | Status: DC | PRN

## 2024-02-03 MED ORDER — SULFAMETHOXAZOLE-TRIMETHOPRIM 800-160 MG PO TABS: 1.0000 | ORAL_TABLET | Freq: Two times a day (BID) | ORAL | 0 refills | Status: AC

## 2024-02-03 NOTE — Addendum Note (Signed)
 Addended by: KRYSTAL ANDREE GAILS on: 02/03/2024 11:46 AM   Modules accepted: Orders

## 2024-02-03 NOTE — Progress Notes (Signed)
 Heather [name] arrived today with chills, dysuria, and lower abdominal pain. Patient is notexperiencing fever, unstable vitals and/or one-sided back flank pain. Patient has not had had a recent hospitalization due to UTI.  Last visit in the office was 12/16/2023.  Per protocol:   The most recent Urinalysis completed on 08-29-2023 and was notnormal.  Last Creatinine level  Lab Results  Component Value Date   CREATININE 0.75 08/12/2023    An urine specimen was collected and POCT urinalysis completed. [] A cath specimen was collected due to patient's current condition, symptoms or post-procedural state.  Total urine output by catheter is  Output by Drain (mL) 02/01/24 0701 - 02/01/24 1900 02/01/24 1901 - 02/02/24 0700 02/02/24 0701 - 02/02/24 1900 02/02/24 1901 - 02/03/24 0700 02/03/24 0701 - 02/03/24 1112  Patient has no LDAs of requested type attached.    Heather [name]    POCT Urine results is not normal.  Urine micro was sent per protocol for abnormal urinalysis.  Urine culture was sent per protocol for abnormal urinalysis.     [x] Pt was notified of positive urine results and plan for additional urine testing. We will contact you within the next 3-4 days with these results.  [] No Prescription was sent to your pharmacy.  The additional testing will indicate if a prescription is needed.   [] Patient was notified of abnormal urine results. The following prescription is sent to your preferred pharmacy.  []  Macrobid 100mg  #10 1 tablet by mouth twice daily with food for 5 days      [x]  Bactrim  DS 800-160mg  #6 1 tablet by mouth twice daily for 3 days        []  Due to your current medication allergies, an alternate prescription was discussed with your provider and will be prescribed and sent to your pharmacy.  [x] You can take over the counter AZO two tablets up to three times a day for two days.  Take AZO tablets with a full glass of water. AZO will turn your urine orange, this is normal.   [] The patient was notified  of negative urine results.  If symptoms persist, you may take over the counter AZO two tablets up to three times a day for two days.  AZO will turn your urine orange, this is normal.  Contact the office back to schedule an appointment if your symptoms persist or worsen or you develop additional symptoms.       CC'd note to patient's provider.

## 2024-02-03 NOTE — Patient Instructions (Signed)
 Please keep all scheduled follow up appointments.   It was a pleasure to see you today!  Thank you for trusting me with your care!

## 2024-02-05 ENCOUNTER — Ambulatory Visit: Payer: Self-pay | Admitting: Obstetrics and Gynecology

## 2024-02-05 DIAGNOSIS — N3000 Acute cystitis without hematuria: Secondary | ICD-10-CM

## 2024-02-05 LAB — URINE CULTURE: Culture: 20000 — AB

## 2024-02-05 MED ORDER — AMOXICILLIN 500 MG PO CAPS: 500.0000 mg | ORAL_CAPSULE | Freq: Two times a day (BID) | ORAL | 0 refills | Status: AC

## 2024-02-06 DIAGNOSIS — M19072 Primary osteoarthritis, left ankle and foot: Secondary | ICD-10-CM | POA: Diagnosis not present

## 2024-02-06 DIAGNOSIS — M79672 Pain in left foot: Secondary | ICD-10-CM | POA: Diagnosis not present

## 2024-02-17 DIAGNOSIS — M6702 Short Achilles tendon (acquired), left ankle: Secondary | ICD-10-CM | POA: Diagnosis not present

## 2024-02-17 DIAGNOSIS — M19072 Primary osteoarthritis, left ankle and foot: Secondary | ICD-10-CM | POA: Diagnosis not present

## 2024-02-17 DIAGNOSIS — S92255D Nondisplaced fracture of navicular [scaphoid] of left foot, subsequent encounter for fracture with routine healing: Secondary | ICD-10-CM | POA: Diagnosis not present

## 2024-03-18 ENCOUNTER — Encounter: Payer: Self-pay | Admitting: Obstetrics and Gynecology

## 2024-03-18 ENCOUNTER — Ambulatory Visit: Admitting: Obstetrics and Gynecology

## 2024-03-18 VITALS — BP 161/67 | HR 67

## 2024-03-18 DIAGNOSIS — N3281 Overactive bladder: Secondary | ICD-10-CM

## 2024-03-18 DIAGNOSIS — Z87440 Personal history of urinary (tract) infections: Secondary | ICD-10-CM | POA: Diagnosis not present

## 2024-03-18 DIAGNOSIS — N390 Urinary tract infection, site not specified: Secondary | ICD-10-CM

## 2024-03-18 MED ORDER — METHENAMINE HIPPURATE 1 G PO TABS: 1.0000 g | ORAL_TABLET | Freq: Two times a day (BID) | ORAL | 5 refills | Status: AC

## 2024-03-18 NOTE — Progress Notes (Signed)
 [facility] Return Visit  SUBJECTIVE  History of Present Illness: IEESHA ABBASI is a 89 y.o. female seen in follow-up for overactive bladder and recurrent UTI.   Last visit was started on Myrbetriq  50mg  since Gemtesa  was too expensive. She does not have much urgency/ frequency. During the day can hold urine for a few hours. Only wakes up maybe once at night.   She did have a UTI at end of July. She was very symptomatic with pain with urination. Culture was 20,000 Enterococcus faecalis. She is using estrogen cream twice a week. She started taking azo cranberry daily. She feels that she is emptying her bladder well and does not have any prolapse symptoms.   s/p Exam under anesthesia, posterior repair, sacrospinous ligament fixation, midurethral sling and cystoscopy on 10/27/20 as well as a sling release on 11/09/20  Past Medical History: Patient  has a past medical history of Aortic atherosclerosis (HCC) (10/12/2021), Cancer (HCC), Cataract, Chronic heart failure with preserved ejection fraction (HFpEF) (HCC), Chronic stable angina (HCC), Coronary artery disease (cardiologist--- dr wonda), Diabetes mellitus type 2, diet-controlled (HCC), DOE (dyspnea on exertion), Echocardiogram 08/2020, Edema of right lower extremity, GERD (gastroesophageal reflux disease), Hiatal hernia, History of cervical cancer, History of DVT of lower extremity (2016), History of esophageal stricture, History of gastric ulcer (2005 approx.), History of palpitations (2010), History of TIA (transient ischemic attack) (06/1999), Hypertension, Intermittent palpitations, IT band syndrome, Migraine, Mixed hyperlipidemia, Mixed incontinence urge and stress, Multiple thyroid  nodules, OA (osteoarthritis), Occasional tremors, Osteoporosis, Peroneal DVT (deep venous thrombosis) (HCC) (12/22/2014), Right bundle branch block (RBBB) with left anterior fascicular block (LAFB), RLS (restless legs syndrome), S/P drug eluting coronary  stent placement (2006), Stress fracture, Stroke (HCC), and Urinary retention.   Past Surgical History: She  has a past surgical history that includes Knee arthroscopy (Bilateral, right ?/   left x2 , last one 09-12-2009 @ [facility]); Foot surgery (Left, 1990s); Upper gastrointestinal endoscopy (last one 04-25-2017); Total knee arthroplasty (11/12/2011); Total knee arthroplasty (Right, 11/29/2014); Colonoscopy (last one ?); Cataract extraction w/ intraocular lens  implant, bilateral (2015); Hiatal hernia repair (1989); Anterior and posterior repair (N/A, 12/22/2019); Vaginal hysterectomy (1988); Coronary angioplasty with stent (03-16-2005   dr morris); Coronary angioplasty with stent (06-27-2005  dr morris); Rectocele repair (N/A, 04/20/2020); Rectocele repair (N/A, 10/27/2020); Anterior and posterior repair with sacrospinous fixation (N/A, 10/27/2020); Bladder suspension (N/A, 10/27/2020); Cystoscopy (N/A, 10/27/2020); Pubovaginal sling (N/A, 11/09/2020); Cystoscopy (N/A, 11/09/2020); Cholecystectomy (N/A, 01/25/2021); Incisional hernia repair (N/A, 01/25/2021); Inguinal hernia repair (Left, 04/17/2021); Joint replacement; Small intestine surgery; Eye surgery; Fracture surgery; Hernia repair; Ventral hernia repair (N/A, 02/11/2023); Lysis of adhesion (N/A, 02/11/2023); Esophagogastroduodenoscopy (egd) with propofol  (N/A, 08/12/2023); biopsy (08/12/2023); EUS (N/A, 10/03/2023); and Esophagogastroduodenoscopy (N/A, 10/03/2023).   Medications: She has a current medication list which includes the following prescription(s): acetaminophen , amlodipine , aspirin  ec, true metrix air glucose meter, diclofenac  sodium, estradiol , furosemide , true metrix blood glucose test, optase comfort dry eye, isosorbide  mononitrate, losartan , magnesium  oxide, methenamine , mirabegron  er, multiple vitamins-minerals, olopatadine  hcl, omeprazole , pramipexole , simvastatin , trueplus lancets 33g, and vibegron .   Allergies: Patient is allergic to  baclofen , gabapentin , and nitrofurantoin.   Social History: Patient  reports that she has never smoked. She has never used smokeless tobacco. She reports that she does not drink alcohol  and does not use drugs.      OBJECTIVE     Physical Exam: Vitals:   03/18/24 1020  BP: (!) 157/72  Pulse: 70    Gen: No apparent distress, A&O  x 3.  Detailed Urogynecologic Evaluation:  Deferred.    ASSESSMENT AND PLAN    Ms. Hayden is a 89 y.o. with:  1. Recurrent urinary tract infection   2. Overactive bladder     - For rUTI, continue vaginal estrace  cream 0.5g twice weekly. Will also start methenamine  1g twice day for UTI prevention. Last UTI was very low colony count so will hold off on prophylactic antibiotics for now. But will consider if she has another UTI this year. - Continue with Myrbetriq  50mg  for OAB  Return 6 months or sooner if needed  Rosaline LOISE Caper, MD

## 2024-03-18 NOTE — Patient Instructions (Signed)
 Prescribed methenamine  to take twice a day- this helps sterilize the urine to prevent UTI Continue vaginal estrogen cream twice a week Continue myrbetriq  50mg  daily for overactive bladder.

## 2024-04-02 ENCOUNTER — Other Ambulatory Visit: Payer: Self-pay | Admitting: Cardiovascular Disease

## 2024-04-23 ENCOUNTER — Other Ambulatory Visit: Payer: Self-pay

## 2024-04-23 ENCOUNTER — Emergency Department (HOSPITAL_COMMUNITY)

## 2024-04-23 ENCOUNTER — Encounter (HOSPITAL_COMMUNITY): Payer: Self-pay

## 2024-04-23 ENCOUNTER — Emergency Department (HOSPITAL_COMMUNITY)
Admission: EM | Admit: 2024-04-23 | Discharge: 2024-04-23 | Disposition: A | Discharge status: Home / Self Care | Attending: Emergency Medicine | Admitting: Emergency Medicine

## 2024-04-23 DIAGNOSIS — I2510 Atherosclerotic heart disease of native coronary artery without angina pectoris: Secondary | ICD-10-CM | POA: Diagnosis not present

## 2024-04-23 DIAGNOSIS — R300 Dysuria: Secondary | ICD-10-CM | POA: Insufficient documentation

## 2024-04-23 DIAGNOSIS — Z7982 Long term (current) use of aspirin: Secondary | ICD-10-CM | POA: Diagnosis not present

## 2024-04-23 DIAGNOSIS — I774 Celiac artery compression syndrome: Secondary | ICD-10-CM | POA: Insufficient documentation

## 2024-04-23 DIAGNOSIS — R350 Frequency of micturition: Secondary | ICD-10-CM | POA: Insufficient documentation

## 2024-04-23 DIAGNOSIS — R3915 Urgency of urination: Secondary | ICD-10-CM | POA: Insufficient documentation

## 2024-04-23 DIAGNOSIS — Z79899 Other long term (current) drug therapy: Secondary | ICD-10-CM | POA: Insufficient documentation

## 2024-04-23 DIAGNOSIS — R1031 Right lower quadrant pain: Secondary | ICD-10-CM | POA: Diagnosis not present

## 2024-04-23 DIAGNOSIS — R3989 Other symptoms and signs involving the genitourinary system: Secondary | ICD-10-CM | POA: Diagnosis not present

## 2024-04-23 DIAGNOSIS — R0682 Tachypnea, not elsewhere classified: Secondary | ICD-10-CM | POA: Diagnosis not present

## 2024-04-23 DIAGNOSIS — I517 Cardiomegaly: Secondary | ICD-10-CM | POA: Diagnosis not present

## 2024-04-23 DIAGNOSIS — I701 Atherosclerosis of renal artery: Secondary | ICD-10-CM | POA: Insufficient documentation

## 2024-04-23 DIAGNOSIS — I5030 Unspecified diastolic (congestive) heart failure: Secondary | ICD-10-CM | POA: Diagnosis not present

## 2024-04-23 DIAGNOSIS — R6883 Chills (without fever): Secondary | ICD-10-CM | POA: Diagnosis not present

## 2024-04-23 DIAGNOSIS — Z8542 Personal history of malignant neoplasm of other parts of uterus: Secondary | ICD-10-CM | POA: Insufficient documentation

## 2024-04-23 DIAGNOSIS — E119 Type 2 diabetes mellitus without complications: Secondary | ICD-10-CM | POA: Insufficient documentation

## 2024-04-23 DIAGNOSIS — R509 Fever, unspecified: Secondary | ICD-10-CM | POA: Diagnosis not present

## 2024-04-23 DIAGNOSIS — R000 Tachycardia, unspecified: Secondary | ICD-10-CM | POA: Diagnosis not present

## 2024-04-23 DIAGNOSIS — K551 Chronic vascular disorders of intestine: Secondary | ICD-10-CM | POA: Insufficient documentation

## 2024-04-23 LAB — COMPREHENSIVE METABOLIC PANEL WITH GFR
ALT: 15 U/L (ref 0–44)
AST: 21 U/L (ref 15–41)
Albumin: 4.1 g/dL (ref 3.5–5.0)
Alkaline Phosphatase: 101 U/L (ref 38–126)
Anion gap: 12 (ref 5–15)
BUN: 20 mg/dL (ref 8–23)
CO2: 24 mmol/L (ref 22–32)
Calcium: 9.5 mg/dL (ref 8.9–10.3)
Chloride: 107 mmol/L (ref 98–111)
Creatinine, Ser: 0.65 mg/dL (ref 0.44–1.00)
GFR, Estimated: >60 mL/min (ref >60)
Glucose, Bld: 102 mg/dL — ABNORMAL HIGH (ref 70–99)
Potassium: 3.8 mmol/L (ref 3.5–5.1)
Sodium: 142 mmol/L (ref 135–145)
Total Bilirubin: 0.6 mg/dL (ref 0.0–1.2)
Total Protein: 6.1 g/dL — ABNORMAL LOW (ref 6.5–8.1)

## 2024-04-23 LAB — I-STAT CHEM 8, ED
BUN: 20 mg/dL (ref 8–23)
Calcium, Ion: 1.23 mmol/L (ref 1.15–1.40)
Chloride: 105 mmol/L (ref 98–111)
Creatinine, Ser: 0.8 mg/dL (ref 0.44–1.00)
Glucose, Bld: 107 mg/dL — ABNORMAL HIGH (ref 70–99)
HCT: 38 % (ref 36.0–46.0)
Hemoglobin: 12.9 g/dL (ref 12.0–15.0)
Potassium: 3.7 mmol/L (ref 3.5–5.1)
Sodium: 142 mmol/L (ref 135–145)
TCO2: 24 mmol/L (ref 22–32)

## 2024-04-23 LAB — URINALYSIS, W/ REFLEX TO CULTURE (INFECTION SUSPECTED)
Bacteria, UA: NONE SEEN
Bilirubin Urine: NEGATIVE
Glucose, UA: NEGATIVE mg/dL
Hgb urine dipstick: NEGATIVE
Ketones, ur: NEGATIVE mg/dL
Leukocytes,Ua: NEGATIVE
Nitrite: NEGATIVE
Protein, ur: 30 mg/dL — AB
Specific Gravity, Urine: 1.006 (ref 1.005–1.030)
pH: 6 (ref 5.0–8.0)

## 2024-04-23 LAB — CBC WITH DIFFERENTIAL/PLATELET
Abs Immature Granulocytes: 0.01 K/uL (ref 0.00–0.07)
Basophils Absolute: 0 K/uL (ref 0.0–0.1)
Basophils Relative: 1 %
Eosinophils Absolute: 0.1 K/uL (ref 0.0–0.5)
Eosinophils Relative: 2 %
HCT: 40.2 % (ref 36.0–46.0)
Hemoglobin: 12.9 g/dL (ref 12.0–15.0)
Immature Granulocytes: 0 %
Lymphocytes Relative: 10 %
Lymphs Abs: 0.6 K/uL — ABNORMAL LOW (ref 0.7–4.0)
MCH: 30.4 pg (ref 26.0–34.0)
MCHC: 32.1 g/dL (ref 30.0–36.0)
MCV: 94.8 fL (ref 80.0–100.0)
Monocytes Absolute: 0.5 K/uL (ref 0.1–1.0)
Monocytes Relative: 8 %
Neutro Abs: 4.6 K/uL (ref 1.7–7.7)
Neutrophils Relative %: 79 %
Platelets: 111 K/uL — ABNORMAL LOW (ref 150–400)
RBC: 4.24 MIL/uL (ref 3.87–5.11)
RDW: 12.7 % (ref 11.5–15.5)
WBC: 5.8 K/uL (ref 4.0–10.5)
nRBC: 0 % (ref 0.0–0.2)

## 2024-04-23 LAB — RESP PANEL BY RT-PCR (RSV, FLU A&B, COVID)  RVPGX2
Influenza A by PCR: NEGATIVE
Influenza B by PCR: NEGATIVE
Resp Syncytial Virus by PCR: NEGATIVE
SARS Coronavirus 2 by RT PCR: NEGATIVE

## 2024-04-23 LAB — PROTIME-INR
INR: 1 (ref 0.8–1.2)
Prothrombin Time: 13.3 s (ref 11.4–15.2)

## 2024-04-23 LAB — I-STAT CG4 LACTIC ACID, ED: Lactic Acid, Venous: 0.7 mmol/L (ref 0.5–1.9)

## 2024-04-23 MED ORDER — IOHEXOL 300 MG/ML  SOLN
100.0000 mL | Freq: Once | INTRAMUSCULAR | Status: AC | PRN
  Administered 2024-04-23: 100 mL via INTRAVENOUS

## 2024-04-23 MED ORDER — CEPHALEXIN 500 MG PO CAPS
500.0000 mg | ORAL_CAPSULE | Freq: Two times a day (BID) | ORAL | 0 refills | Status: AC
Start: 2024-04-23 — End: 2024-04-30

## 2024-04-23 NOTE — ED Triage Notes (Signed)
 Pt BIB [facility] from home after medical alarm bracelet alarmed. Pt reports UTI symptoms since yesterday. Pt states she has dysuria and started having chills last night. She reported a temp of 102 at home. Pt took 100 mg Tylenol  at home before EMS arrived.   400 LR 186/90 22 RR ETC02 35 95 RA CBG 103 HR 70s 18 LAC

## 2024-04-23 NOTE — ED Provider Notes (Addendum)
 Physical Exam  BP (!) 148/81   Pulse 72   Temp 98.2 F (36.8 C) (Oral)   Resp 18   Ht 5' 2 (1.575 m)   Wt 62.1 kg   LMP  (LMP Unknown)   SpO2 97%   BMI 25.06 kg/m   Physical Exam Vitals and nursing note reviewed.  Constitutional:      General: She is not in acute distress.    Appearance: Normal appearance. She is not ill-appearing or diaphoretic.  Eyes:     General:        Right eye: No discharge.        Left eye: No discharge.     Extraocular Movements: Extraocular movements intact.     Conjunctiva/sclera: Conjunctivae normal.     Pupils: Pupils are equal, round, and reactive to light.  Cardiovascular:     Rate and Rhythm: Normal rate and regular rhythm.  Pulmonary:     Effort: Pulmonary effort is normal. No respiratory distress.  Abdominal:     General: Abdomen is flat. There is no distension.     Tenderness: There is abdominal tenderness (Mild right lower quadrant tenderness to palpation). There is no right CVA tenderness, left CVA tenderness, guarding or rebound.     Hernia: No hernia is present.  Musculoskeletal:     Cervical back: Normal range of motion and neck supple. No rigidity.     Right lower leg: No edema.     Left lower leg: No edema.  Skin:    General: Skin is warm and dry.  Neurological:     General: No focal deficit present.     Mental Status: She is alert and oriented to person, place, and time. Mental status is at baseline.     Sensory: No sensory deficit.     Motor: No weakness.     Procedures  Procedures  ED Course / MDM   Clinical Course as of 04/23/24 1002  Thu Apr 23, 2024  9367 Dysuria, urgency. Negative labs. Viral panel pending. Recheck temp. Possibly consider dose of rocephin  vs sending home on urine culture empirically.  [CB]    Clinical Course User Index [CB] Beola Terrall RAMAN, PA-C   Medical Decision Making Amount and/or Complexity of Data Reviewed Labs: ordered. Radiology: ordered.  Risk Prescription drug  management.   Patient care transferred over from Leita Chancy, PA-C.  At time of handoff, possible CT scan and sent home with antibiotics empirically if negative for dysuria/urgency.  No vaginal symptoms.  Denies GI symptoms.  On physical exam, patient is in no acute distress, afebrile, alert and orient x 4, speaking in full sentences, nontachypneic, nontachycardic. Notably does have mild right lower quadrant abdominal pain to palpation, CT scan ordered for evaluation.  Patient has well-appearing presentation.  CT scan did not show any acute or malady but did show severe renal artery, superior mesenteric and celiac artery stenosis.  Will have her continue follow-up with vascular surgery.  Will treat empirically secondary to patient's symptoms of dysuria and urgency per previous provider's request.  Will send home with Keflex .  Blood cultures and wound cultures pending.  Case discussed with attending who agrees with plan.  Patient vital signs have remained stable throughout the course of patient's time in the ED. Low suspicion for any other emergent pathology at this time. I believe this patient is safe to be discharged. Provided strict return to ER precautions. Patient expressed agreement and understanding of plan. All questions were answered.  Beola Terrall RAMAN, PA-C 04/23/24 1006    [address], PA-C 04/23/24 [address] L, DO 04/26/24 [phone]

## 2024-04-23 NOTE — ED Provider Notes (Signed)
 [place] EM[facility] DEPARTMENT AT [facility] Provider Note   CSN: [id number] Arrival date & time: 04/23/24  9493     Patient presents with: Dysuria   Heather [name] is a 89 y.o. female.   89 year old female brought in by EMS from home with concern for UTI and fever.  Patient states that she started having dysuria and frequency yesterday, developed chills tonight and had a temperature of 102 at home.  Patient took 1 g of Tylenol  while waiting for EMS.  Was provided with 400 LR bolus and route.  Patient arrives to the emergency room ambulatory to the bathroom to provide urine sample, denies abdominal pain, vomiting.  No other complaints or concerns.  Reports history of frequent urinary tract infections.  History of prior urosepsis.   Dysuria      Prior to Admission medications   Medication Sig Start Date End Date Taking? Authorizing Provider  acetaminophen  (TYLENOL ) 500 MG tablet Take 1,000 mg by mouth every 6 (six) hours as needed for moderate pain.    [provider]  amLODipine  (NORVASC ) 5 MG tablet Take 1 tablet (5 mg total) by mouth daily. 09/16/23   Wonda Sharper, MD  aspirin  EC 81 MG tablet Take 1 tablet (81 mg total) by mouth daily. 05/04/15   Wonda Sharper, MD  Blood Glucose Monitoring Suppl (TRUE METRIX AIR GLUCOSE METER) w/Device KIT USE TO CHECK BLOOD SUGAR ONCE DAILY AND AS NEEDED.  DX CODE E11.9 07/27/20   Domenica Harlene LABOR, MD  diclofenac  Sodium (VOLTAREN ) 1 % GEL Apply 1 application topically 4 (four) times daily as needed (hip pain).    [provider]  estradiol  (ESTRACE ) 0.1 MG/GM vaginal cream Place 0.5 g vaginally 2 (two) times a week. Place 0.5g nightly for two weeks then twice a week after 03/28/23   Marilynne Rosaline SAILOR, MD  furosemide  (LASIX ) 20 MG tablet Take 20 mg by mouth as needed for fluid.    [provider]  glucose blood (TRUE METRIX BLOOD GLUCOSE TEST) test strip TEST BLOOD SUGAR EVERY DAY  OR AS NEEDED 01/29/24    Domenica Harlene LABOR, MD  Glycerin, PF, (OPTASE COMFORT DRY EYE) 1 % SOLN Place 1 drop into both eyes in the morning and at bedtime.    [provider]  isosorbide  mononitrate (IMDUR ) 60 MG 24 hr tablet TAKE 1 AND 1/2 TABLETS EVERY DAY 04/02/24   Wonda Sharper, MD  losartan  (COZAAR ) 100 MG tablet TAKE 1 TABLET EVERY DAY Patient taking differently: Take 50 mg by mouth 2 (two) times daily. 01/07/23   Cooper, Michael, MD  Magnesium  Oxide 250 MG TABS Take 250 mg by mouth every morning.    [provider]  methenamine  (HIPREX ) 1 g tablet Take 1 tablet (1 g total) by mouth 2 (two) times daily with a meal. 03/18/24   Marilynne Rosaline SAILOR, MD  mirabegron  ER (MYRBETRIQ ) 50 MG TB24 tablet Take 1 tablet (50 mg total) by mouth daily. 12/16/23   Marilynne Rosaline SAILOR, MD  Multiple Vitamins-Minerals (ZINC  PO) Take 50 mg by mouth daily. with Vitamin D  2000IU capsule daily    [provider]  Olopatadine  HCl (PATADAY ) 0.2 % SOLN Place 1 drop into both eyes daily.    [provider]  omeprazole  (PRILOSEC) 20 MG capsule TAKE 1 CAPSULE EVERY DAY AS NEEDED (NEED OFFICE VISIT BEFORE ANY FURTHER REFILLS) 11/18/23   Domenica Harlene LABOR, MD  pramipexole  (MIRAPEX ) 0.25 MG tablet TAKE 1 TABLET TWICE DAILY 11/18/23  Domenica Harlene LABOR, MD  simvastatin  (ZOCOR ) 20 MG tablet TAKE 1 TABLET AT BEDTIME 02/01/23   Wonda Sharper, MD  TRUEplus Lancets 33G MISC USE TO CHECK BLOOD SUGAR ONCE DAILY AND AS NEEDED.  DX CODE: E11.9 07/27/20   Domenica Harlene LABOR, MD  Vibegron  75 MG TABS Take 1 tablet (75 mg total) by mouth daily. 03/28/23   Marilynne Rosaline SAILOR, MD    Allergies: Baclofen , Gabapentin , and Nitrofurantoin    Review of Systems  Genitourinary:  Positive for dysuria.   Negative except as per HPI Updated Vital Signs BP (!) 148/81   Pulse 72   Temp 98.2 F (36.8 C) (Oral)   Resp 18   Ht 5' 2 (1.575 m)   Wt 62.1 kg   LMP  (LMP Unknown)   SpO2 97%   BMI 25.06 kg/m   Physical Exam Vitals and  nursing note reviewed.  Constitutional:      General: She is not in acute distress.    Appearance: She is well-developed. She is not diaphoretic.  HENT:     Head: Normocephalic and atraumatic.     Mouth/Throat:     Mouth: Mucous membranes are moist.  Cardiovascular:     Rate and Rhythm: Normal rate and regular rhythm.     Heart sounds: Normal heart sounds.  Pulmonary:     Effort: Pulmonary effort is normal.     Breath sounds: Normal breath sounds.  Abdominal:     Palpations: Abdomen is soft.     Tenderness: There is no abdominal tenderness.  Musculoskeletal:     Right lower leg: No edema.     Left lower leg: No edema.  Skin:    General: Skin is warm and dry.     Findings: No erythema or rash.  Neurological:     Mental Status: She is alert and oriented to person, place, and time.  Psychiatric:        Behavior: Behavior normal.     (all labs ordered are listed, but only abnormal results are displayed) Labs Reviewed  COMPREHENSIVE METABOLIC PANEL WITH GFR - Abnormal; Notable for the following components:      Result Value   Glucose, Bld 102 (*)    Total Protein 6.1 (*)    All other components within normal limits  CBC WITH DIFFERENTIAL/PLATELET - Abnormal; Notable for the following components:   Platelets 111 (*)    Lymphs Abs 0.6 (*)    All other components within normal limits  URINALYSIS, W/ REFLEX TO CULTURE (INFECTION SUSPECTED) - Abnormal; Notable for the following components:   Color, Urine STRAW (*)    Protein, ur 30 (*)    All other components within normal limits  I-STAT CHEM 8, ED - Abnormal; Notable for the following components:   Glucose, Bld 107 (*)    All other components within normal limits  RESP PANEL BY RT-PCR (RSV, FLU A&B, COVID)  RVPGX2  CULTURE, BLOOD (ROUTINE X 2)  CULTURE, BLOOD (ROUTINE X 2)  URINE CULTURE  PROTIME-INR  I-STAT CG4 LACTIC ACID, ED    EKG: None  Radiology: DG Chest Port 1 View Result Date: 04/23/2024 EXAM: 1 VIEW(S)  XRAY OF THE CHEST 04/23/2024 05:35:00 AM COMPARISON: AP and lateral chest 08/08/2023. CLINICAL HISTORY: Questionable sepsis - evaluate for abnormality. Per pt notes: Pt BIB [facility] from home after medical alarm bracelet alarmed. Pt reports UTI symptoms since yesterday. Pt states she has dysuria and started having chills last night. She reported a temp of  102 at home. FINDINGS: LINES, TUBES AND DEVICES: Multiple overlying monitor wires. LUNGS AND PLEURA: The lungs are clear of infiltrates. There is linear scarring or atelectasis in the left base. Calcified granuloma right lower lung field. There is no substantial pleural effusion. No pneumothorax. No pulmonary edema. HEART AND MEDIASTINUM: Mild cardiomegaly. No evidence for CHF. There is a short stent in the circumflex coronary artery. Stable mediastinum. There is patchy heavy thoracic aortic calcific plaque. BONES AND SOFT TISSUES: There is osteopenia, degenerative change, and mild dextroscoliosis of the thoracic spine. IMPRESSION: 1. No acute cardiopulmonary findings. 2. Mild cardiomegaly. No evidence of congestive heart failure. 3. Aortic atherosclerosis. Electronically signed by: Francis Quam MD 04/23/2024 05:42 AM EDT RP Workstation: [id number]     Procedures   Medications Ordered in the ED  iohexol  (OMNIPAQUE ) 300 MG/ML solution 100 mL (100 mLs Intravenous Contrast Given 04/23/24 0746)    Clinical Course as of 04/23/24 0822  Thu Apr 23, 2024  0632 Dysuria, urgency. Negative labs. Viral panel pending. Recheck temp. Possibly consider dose of rocephin  vs sending home on urine culture empirically.  [CB]    Clinical Course User Index [CB] Beola Terrall RAMAN, PA-C                                 Medical Decision Making Amount and/or Complexity of Data Reviewed Labs: ordered. Radiology: ordered.  Risk Prescription drug management.   This patient presents to the ED for concern of fever, dysuria, frequency, this involves an extensive number of  treatment options, and is a complaint that carries with it a high risk of complications and morbidity.  The differential diagnosis includes but not limited to UTI, sepsis, kidney stone, viral illness    Co morbidities / Chronic conditions that complicate the patient evaluation  Hyperlipidemia, hypertension, GERD, CAD, diabetes, renal insufficiency, SBO, CHF (diastolic), DVT (not on thinners), uterine cancer (s/p hysterectomy)   Additional history obtained:  Additional history obtained from EMR External records from outside source obtained and reviewed including GYN visit dated 03/18/24, on Myrbetriq . UTI end of July, culture with 20k enterococcus faecalis. Not on prophylactic abx due to low colony count but reconsider if another UTI this year.  Echo 01/29/21 with EF 60-65%   Lab Tests:  I Ordered, and personally interpreted labs.  The pertinent results include: Urinalysis with protein, negative for nitrites, leukocytes, no bacteria seen.  Lactic acid reassuring at 0.7. CBC without significant findings. CMP without significant findings. Viral panel pending at time of sign out. INR normal   Imaging Studies ordered:  I ordered imaging studies including CXR  I independently visualized and interpreted imaging which showed rotated positioning, no acute process  I agree with the radiologist interpretation   Cardiac Monitoring: / EKG:  The patient was maintained on a cardiac monitor.  I personally viewed and interpreted the cardiac monitored which showed an underlying rhythm of: sinus arrhythmia, rate 67   Problem List / ED Course / Critical interventions / Medication management  89 year old female with history of frequent urinary tract infections and prior urosepsis presents emergency room via EMS with concern for same.  Patient states that she developed dysuria and frequency yesterday, woke up with chills and fever of 102 tonight.  Patient took Tylenol  prior to EMS arrival.  Arrives  afebrile, nontoxic and in no distress.  Patient ambulated to the bathroom without difficulty.  Her abdomen is soft and nontender.  Exam is  unremarkable.  She was provided 400 LR with EMS.  Blood pressure slightly elevated on arrival.  CBC with normal WBC.  I-STAT Chem-8 without significant findings.  Urinalysis does not suggest infection. Viral swab pending. Recheck, monitor for fever, consider additional imaging vs dc home with close follow up.  I have reviewed the patients home medicines and have made adjustments as needed    Social Determinants of Health:  Lives at home   Test / Admission - Considered:  Disposition pending at time of sign out to oncoming provider.       Final diagnoses:  Dysuria    ED Discharge Orders     None          Beverley Leita DELENA DEVONNA 04/23/24 9177    Bari Charmaine FALCON, MD 04/24/24 [phone]

## 2024-04-23 NOTE — Discharge Instructions (Addendum)
 You are seen today for painful urination, urgency, fever.  Additionally on your CT scan renal artery, celiac artery and superior mesenteric artery stenosis was noted for which you will need to follow-up with vascular surgery.  Your labs and imaging otherwise were very reassuring, that I low suspicion for any emergent cause of your symptoms this time.  We will treat you prophylactically with antibiotic, with cultures pending, they will reach out to if positive.  Please return to the ED though if concerning new or worsening symptoms which would include uncontrollable pain, persistent fever, persistent vomiting, confusion, weakness, shortness of breath or chest pain.  Otherwise please ensure you follow-up with your PCP for reevaluation within the week to ensure resolution of symptoms.  I am sending in an antibiotic for you to use to treat empirically for the painful urination and urgency that you expressed today.

## 2024-04-24 LAB — URINE CULTURE: Culture: <10000 — AB

## 2024-04-28 LAB — CULTURE, BLOOD (ROUTINE X 2)
Culture: NO GROWTH
Culture: NO GROWTH
Special Requests: ADEQUATE
Special Requests: ADEQUATE

## 2024-05-12 DIAGNOSIS — Z8249 Family history of ischemic heart disease and other diseases of the circulatory system: Secondary | ICD-10-CM | POA: Diagnosis not present

## 2024-05-12 DIAGNOSIS — I25119 Atherosclerotic heart disease of native coronary artery with unspecified angina pectoris: Secondary | ICD-10-CM | POA: Diagnosis not present

## 2024-05-12 DIAGNOSIS — I739 Peripheral vascular disease, unspecified: Secondary | ICD-10-CM | POA: Diagnosis not present

## 2024-05-12 DIAGNOSIS — E785 Hyperlipidemia, unspecified: Secondary | ICD-10-CM | POA: Diagnosis not present

## 2024-05-12 DIAGNOSIS — N3941 Urge incontinence: Secondary | ICD-10-CM | POA: Diagnosis not present

## 2024-05-12 DIAGNOSIS — K219 Gastro-esophageal reflux disease without esophagitis: Secondary | ICD-10-CM | POA: Diagnosis not present

## 2024-05-12 DIAGNOSIS — Z7982 Long term (current) use of aspirin: Secondary | ICD-10-CM | POA: Diagnosis not present

## 2024-05-12 DIAGNOSIS — G2581 Restless legs syndrome: Secondary | ICD-10-CM | POA: Diagnosis not present

## 2024-05-12 DIAGNOSIS — I10 Essential (primary) hypertension: Secondary | ICD-10-CM | POA: Diagnosis not present

## 2024-05-25 ENCOUNTER — Other Ambulatory Visit: Payer: Self-pay | Admitting: Cardiovascular Disease

## 2024-06-21 ENCOUNTER — Other Ambulatory Visit: Payer: Self-pay | Admitting: Obstetrics and Gynecology

## 2024-06-21 DIAGNOSIS — N3281 Overactive bladder: Secondary | ICD-10-CM

## 2024-07-20 ENCOUNTER — Encounter: Payer: Self-pay | Admitting: *Deleted

## 2024-07-29 ENCOUNTER — Ambulatory Visit (INDEPENDENT_AMBULATORY_CARE_PROVIDER_SITE_OTHER)

## 2024-07-29 ENCOUNTER — Other Ambulatory Visit (HOSPITAL_COMMUNITY)
Admission: RE | Admit: 2024-07-29 | Discharge: 2024-07-29 | Disposition: A | Source: Ambulatory Visit | Attending: Obstetrics and Gynecology | Admitting: Obstetrics and Gynecology

## 2024-07-29 VITALS — BP 151/75 | HR 97 | Temp 97.9°F

## 2024-07-29 DIAGNOSIS — N898 Other specified noninflammatory disorders of vagina: Secondary | ICD-10-CM | POA: Insufficient documentation

## 2024-07-29 DIAGNOSIS — R300 Dysuria: Secondary | ICD-10-CM

## 2024-07-29 LAB — POCT URINALYSIS DIP (CLINITEK)
Bilirubin, UA: NEGATIVE
Blood, UA: NEGATIVE
Glucose, UA: NEGATIVE mg/dL
Leukocytes, UA: NEGATIVE
Nitrite, UA: NEGATIVE
POC PROTEIN,UA: NEGATIVE
Spec Grav, UA: 1.02
Urobilinogen, UA: 0.2 U/dL
pH, UA: 5.5

## 2024-07-29 NOTE — Patient Instructions (Signed)
 Your Urine dip that was done in office was Negative. I am sending the urine off for culture and you can take AZO over the counter for your discomfort.  We will contact you when the results are back between 3-5 days. If an antibiotic is needed we will sent the order to the pharmacy and you will be notified. If you have any questions or concerns please feel free to call us at [phone]

## 2024-07-29 NOTE — Progress Notes (Signed)
 Heather [name] arrived today with dysuria and urinary frequency. Patient is notexperiencing fever, unstable vitals and/or one-sided back flank pain. Patient has not had had a recent hospitalization due to UTI.  Last visit in the office was 06/21/2024.  Per protocol:   The most recent Urinalysis completed on 04/26/2024 and was normal.  Last Creatinine level  Lab Results  Component Value Date   CREATININE 0.80 04/23/2024    An urine specimen was collected and POCT urinalysis completed. [] A cath specimen was collected due to patient's current condition, symptoms or post-procedural state.  Total urine output by catheter is  Output by Drain (mL) 07/27/24 0701 - 07/27/24 1900 07/27/24 1901 - 07/28/24 0700 07/28/24 0701 - 07/28/24 1900 07/28/24 1901 - 07/29/24 0700 07/29/24 0701 - 07/29/24 1505  Patient has no LDAs of requested type attached.    [name]    POCT Urine results is normal.  Urine micro was not sent per protocol for abnormal urinalysis.  Urine culture was sent per protocol for abnormal urinalysis.     [x] Pt was notified of urine results and plan for additional urine testing. We will contact you within the next 3-4 days with these results.  [x] No Prescription was sent to your pharmacy.  The additional testing will indicate if a prescription is needed.   [] Patient was notified of abnormal urine results. The following prescription is sent to your preferred pharmacy.  []  Macrobid 100mg  #10 1 tablet by mouth twice daily with food for 5 days      []  Bactrim  DS 800-160mg  #6 1 tablet by mouth twice daily for 3 days        []  Due to your current medication allergies, an alternate prescription was discussed with your provider and will be prescribed and sent to your pharmacy.  [] You can take over the counter AZO two tablets up to three times a day for two days.  Take AZO tablets with a full glass of water. AZO will turn your urine orange, this is normal.   [] The patient was notified of negative urine  results.  If symptoms persist, you may take over the counter AZO two tablets up to three times a day for two days.  AZO will turn your urine orange, this is normal.  Contact the office back to schedule an appointment if your symptoms persist or worsen or you develop additional symptoms.       CC'd note to patient's provider.

## 2024-07-30 ENCOUNTER — Ambulatory Visit: Payer: Self-pay | Admitting: Obstetrics and Gynecology

## 2024-07-30 DIAGNOSIS — B379 Candidiasis, unspecified: Secondary | ICD-10-CM

## 2024-07-30 LAB — CERVICOVAGINAL ANCILLARY ONLY
Bacterial Vaginitis (gardnerella): NEGATIVE
Candida Glabrata: POSITIVE — AB
Candida Vaginitis: NEGATIVE
Comment: NEGATIVE
Comment: NEGATIVE
Comment: NEGATIVE

## 2024-07-30 LAB — URINE CULTURE

## 2024-07-31 MED ORDER — FLUCONAZOLE 150 MG PO TABS: 150.0000 mg | ORAL_TABLET | ORAL | 0 refills | Status: AC

## 2024-08-10 ENCOUNTER — Other Ambulatory Visit: Payer: Self-pay

## 2024-08-10 DIAGNOSIS — M79604 Pain in right leg: Secondary | ICD-10-CM

## 2024-09-03 ENCOUNTER — Ambulatory Visit (HOSPITAL_COMMUNITY)

## 2024-09-03 ENCOUNTER — Encounter: Admitting: Vascular Surgery

## 2025-02-03 ENCOUNTER — Ambulatory Visit
